# Patient Record
Sex: Female | Born: 1955 | Race: Black or African American | Hispanic: No | Smoking: Never smoker
Health system: Southern US, Community
[De-identification: ages and names within clinical notes are randomized; demographics above are authoritative.]

## PROBLEM LIST (undated history)

## (undated) DIAGNOSIS — I10 Essential (primary) hypertension: Secondary | ICD-10-CM

## (undated) DIAGNOSIS — D573 Sickle-cell trait: Secondary | ICD-10-CM

## (undated) DIAGNOSIS — N289 Disorder of kidney and ureter, unspecified: Secondary | ICD-10-CM

## (undated) DIAGNOSIS — E215 Disorder of parathyroid gland, unspecified: Secondary | ICD-10-CM

## (undated) DIAGNOSIS — I499 Cardiac arrhythmia, unspecified: Secondary | ICD-10-CM

## (undated) DIAGNOSIS — C50919 Malignant neoplasm of unspecified site of unspecified female breast: Secondary | ICD-10-CM

## (undated) DIAGNOSIS — R51 Headache: Secondary | ICD-10-CM

## (undated) DIAGNOSIS — J189 Pneumonia, unspecified organism: Secondary | ICD-10-CM

## (undated) DIAGNOSIS — I82409 Acute embolism and thrombosis of unspecified deep veins of unspecified lower extremity: Secondary | ICD-10-CM

## (undated) DIAGNOSIS — F419 Anxiety disorder, unspecified: Secondary | ICD-10-CM

## (undated) DIAGNOSIS — M109 Gout, unspecified: Secondary | ICD-10-CM

## (undated) DIAGNOSIS — N189 Chronic kidney disease, unspecified: Secondary | ICD-10-CM

## (undated) DIAGNOSIS — F32A Depression, unspecified: Secondary | ICD-10-CM

## (undated) DIAGNOSIS — R519 Headache, unspecified: Secondary | ICD-10-CM

## (undated) DIAGNOSIS — Z992 Dependence on renal dialysis: Secondary | ICD-10-CM

## (undated) DIAGNOSIS — L409 Psoriasis, unspecified: Secondary | ICD-10-CM

## (undated) DIAGNOSIS — I509 Heart failure, unspecified: Secondary | ICD-10-CM

## (undated) DIAGNOSIS — F329 Major depressive disorder, single episode, unspecified: Secondary | ICD-10-CM

## (undated) DIAGNOSIS — D649 Anemia, unspecified: Secondary | ICD-10-CM

## (undated) DIAGNOSIS — R112 Nausea with vomiting, unspecified: Secondary | ICD-10-CM

## (undated) HISTORY — DX: Heart failure, unspecified: I50.9

## (undated) HISTORY — PX: PARTIAL HYSTERECTOMY: SHX80

## (undated) HISTORY — DX: Acute embolism and thrombosis of unspecified deep veins of unspecified lower extremity: I82.409

## (undated) HISTORY — DX: Psoriasis, unspecified: L40.9

## (undated) HISTORY — DX: Major depressive disorder, single episode, unspecified: F32.9

## (undated) HISTORY — DX: Essential (primary) hypertension: I10

## (undated) HISTORY — PX: APPENDECTOMY: SHX54

## (undated) HISTORY — DX: Anemia, unspecified: D64.9

## (undated) HISTORY — DX: Disorder of parathyroid gland, unspecified: E21.5

## (undated) HISTORY — DX: Gout, unspecified: M10.9

## (undated) HISTORY — DX: Dependence on renal dialysis: Z99.2

## (undated) HISTORY — DX: Anxiety disorder, unspecified: F41.9

## (undated) HISTORY — DX: Depression, unspecified: F32.A

## (undated) SURGERY — INSERTION, TUNNELED CENTRAL VENOUS DEVICE, WITH PORT
Anesthesia: Choice | Laterality: Left

---

## 1978-01-07 HISTORY — PX: ABDOMINAL HYSTERECTOMY: SHX81

## 1983-01-08 DIAGNOSIS — I82409 Acute embolism and thrombosis of unspecified deep veins of unspecified lower extremity: Secondary | ICD-10-CM

## 1983-01-08 HISTORY — DX: Acute embolism and thrombosis of unspecified deep veins of unspecified lower extremity: I82.409

## 2000-01-08 HISTORY — PX: BREAST EXCISIONAL BIOPSY: SUR124

## 2003-03-22 ENCOUNTER — Other Ambulatory Visit: Payer: Self-pay

## 2004-06-22 ENCOUNTER — Ambulatory Visit: Payer: Self-pay

## 2005-08-27 ENCOUNTER — Ambulatory Visit: Payer: Self-pay

## 2006-11-04 ENCOUNTER — Ambulatory Visit: Payer: Self-pay

## 2006-11-23 ENCOUNTER — Emergency Department: Payer: Self-pay | Admitting: Emergency Medicine

## 2007-12-23 ENCOUNTER — Ambulatory Visit: Payer: Self-pay

## 2009-01-10 ENCOUNTER — Ambulatory Visit: Payer: Self-pay | Admitting: Family Medicine

## 2010-02-13 ENCOUNTER — Ambulatory Visit: Payer: Self-pay

## 2010-06-15 ENCOUNTER — Emergency Department: Payer: Self-pay | Admitting: Emergency Medicine

## 2011-05-21 ENCOUNTER — Ambulatory Visit: Payer: Self-pay

## 2011-05-23 ENCOUNTER — Ambulatory Visit: Payer: Self-pay

## 2012-01-08 HISTORY — PX: INSERTION OF DIALYSIS CATHETER: SHX1324

## 2012-04-30 DIAGNOSIS — N051 Unspecified nephritic syndrome with focal and segmental glomerular lesions: Secondary | ICD-10-CM | POA: Insufficient documentation

## 2012-04-30 DIAGNOSIS — I82401 Acute embolism and thrombosis of unspecified deep veins of right lower extremity: Secondary | ICD-10-CM | POA: Insufficient documentation

## 2012-04-30 DIAGNOSIS — F32A Depression, unspecified: Secondary | ICD-10-CM | POA: Insufficient documentation

## 2012-04-30 DIAGNOSIS — F329 Major depressive disorder, single episode, unspecified: Secondary | ICD-10-CM | POA: Insufficient documentation

## 2012-08-04 ENCOUNTER — Ambulatory Visit: Payer: Self-pay | Admitting: Family Medicine

## 2012-08-13 DIAGNOSIS — G252 Other specified forms of tremor: Secondary | ICD-10-CM | POA: Insufficient documentation

## 2013-01-27 DIAGNOSIS — R634 Abnormal weight loss: Secondary | ICD-10-CM | POA: Insufficient documentation

## 2013-01-27 DIAGNOSIS — R432 Parageusia: Secondary | ICD-10-CM | POA: Insufficient documentation

## 2013-01-28 ENCOUNTER — Ambulatory Visit: Payer: Self-pay

## 2013-04-27 ENCOUNTER — Inpatient Hospital Stay: Payer: Self-pay | Admitting: Internal Medicine

## 2013-04-27 LAB — CBC WITH DIFFERENTIAL/PLATELET
Basophil #: 0 10*3/uL (ref 0.0–0.1)
Basophil %: 0.5 %
Eosinophil #: 0.2 10*3/uL (ref 0.0–0.7)
Eosinophil %: 2 %
HCT: 33.7 % — AB (ref 35.0–47.0)
HGB: 10.8 g/dL — ABNORMAL LOW (ref 12.0–16.0)
Lymphocyte #: 1.5 10*3/uL (ref 1.0–3.6)
Lymphocyte %: 18.2 %
MCH: 25.7 pg — AB (ref 26.0–34.0)
MCHC: 32.1 g/dL (ref 32.0–36.0)
MCV: 80 fL (ref 80–100)
Monocyte #: 0.6 x10 3/mm (ref 0.2–0.9)
Monocyte %: 6.6 %
NEUTROS ABS: 6.2 10*3/uL (ref 1.4–6.5)
NEUTROS PCT: 72.7 %
Platelet: 340 10*3/uL (ref 150–440)
RBC: 4.21 10*6/uL (ref 3.80–5.20)
RDW: 17.3 % — ABNORMAL HIGH (ref 11.5–14.5)
WBC: 8.5 10*3/uL (ref 3.6–11.0)

## 2013-04-27 LAB — COMPREHENSIVE METABOLIC PANEL
ALBUMIN: 1.9 g/dL — AB (ref 3.4–5.0)
ALK PHOS: 109 U/L
ANION GAP: 12 (ref 7–16)
AST: 15 U/L (ref 15–37)
BUN: 62 mg/dL — ABNORMAL HIGH (ref 7–18)
Bilirubin,Total: 0.2 mg/dL (ref 0.2–1.0)
Calcium, Total: 8.3 mg/dL — ABNORMAL LOW (ref 8.5–10.1)
Chloride: 112 mmol/L — ABNORMAL HIGH (ref 98–107)
Co2: 18 mmol/L — ABNORMAL LOW (ref 21–32)
Creatinine: 11.45 mg/dL — ABNORMAL HIGH (ref 0.60–1.30)
GFR CALC AF AMER: 4 — AB
GFR CALC NON AF AMER: 3 — AB
GLUCOSE: 99 mg/dL (ref 65–99)
OSMOLALITY: 301 (ref 275–301)
Potassium: 3.5 mmol/L (ref 3.5–5.1)
SGPT (ALT): 18 U/L (ref 12–78)
SODIUM: 142 mmol/L (ref 136–145)
Total Protein: 6.1 g/dL — ABNORMAL LOW (ref 6.4–8.2)

## 2013-04-27 LAB — URINALYSIS, COMPLETE
Bilirubin,UR: NEGATIVE
Glucose,UR: 150 mg/dL (ref 0–75)
Hyaline Cast: 3
Ketone: NEGATIVE
Leukocyte Esterase: NEGATIVE
Nitrite: NEGATIVE
PH: 5 (ref 4.5–8.0)
Protein: >=500
RBC,UR: 18 /HPF (ref 0–5)
SPECIFIC GRAVITY: 1.033 (ref 1.003–1.030)
Squamous Epithelial: 4

## 2013-04-27 LAB — PROTIME-INR
INR: 3.8
Prothrombin Time: 35.9 secs — ABNORMAL HIGH (ref 11.5–14.7)

## 2013-04-27 LAB — CLOSTRIDIUM DIFFICILE(ARMC)

## 2013-04-28 LAB — BASIC METABOLIC PANEL
Anion Gap: 13 (ref 7–16)
BUN: 64 mg/dL — AB (ref 7–18)
CALCIUM: 7.7 mg/dL — AB (ref 8.5–10.1)
CHLORIDE: 118 mmol/L — AB (ref 98–107)
Co2: 14 mmol/L — ABNORMAL LOW (ref 21–32)
Creatinine: 11.12 mg/dL — ABNORMAL HIGH (ref 0.60–1.30)
EGFR (African American): 4 — ABNORMAL LOW
EGFR (Non-African Amer.): 3 — ABNORMAL LOW
Glucose: 82 mg/dL (ref 65–99)
Osmolality: 306 (ref 275–301)
Potassium: 3.4 mmol/L — ABNORMAL LOW (ref 3.5–5.1)
Sodium: 145 mmol/L (ref 136–145)

## 2013-04-28 LAB — WBCS, STOOL

## 2013-04-28 LAB — CBC WITH DIFFERENTIAL/PLATELET
BASOS ABS: 0 10*3/uL (ref 0.0–0.1)
Basophil %: 0.8 %
Eosinophil #: 0.1 10*3/uL (ref 0.0–0.7)
Eosinophil %: 2 %
HCT: 26.9 % — ABNORMAL LOW (ref 35.0–47.0)
HGB: 8.7 g/dL — AB (ref 12.0–16.0)
Lymphocyte #: 1.3 10*3/uL (ref 1.0–3.6)
Lymphocyte %: 20.4 %
MCH: 25.9 pg — ABNORMAL LOW (ref 26.0–34.0)
MCHC: 32.2 g/dL (ref 32.0–36.0)
MCV: 80 fL (ref 80–100)
Monocyte #: 0.6 x10 3/mm (ref 0.2–0.9)
Monocyte %: 9.9 %
Neutrophil #: 4.3 10*3/uL (ref 1.4–6.5)
Neutrophil %: 66.9 %
Platelet: 228 10*3/uL (ref 150–440)
RBC: 3.35 10*6/uL — ABNORMAL LOW (ref 3.80–5.20)
RDW: 16.9 % — AB (ref 11.5–14.5)
WBC: 6.4 10*3/uL (ref 3.6–11.0)

## 2013-04-28 LAB — PROTIME-INR
INR: 4.5 — AB
PROTHROMBIN TIME: 41.2 s — AB (ref 11.5–14.7)

## 2013-04-29 LAB — URINE CULTURE

## 2013-04-29 LAB — PROTIME-INR
INR: 3.4
PROTHROMBIN TIME: 33 s — AB (ref 11.5–14.7)

## 2013-04-30 LAB — LIPID PANEL
Cholesterol: 335 mg/dL — ABNORMAL HIGH (ref 0–200)
HDL Cholesterol: 32 mg/dL — ABNORMAL LOW (ref 40–60)
Ldl Cholesterol, Calc: 258 mg/dL — ABNORMAL HIGH (ref 0–100)
TRIGLYCERIDES: 225 mg/dL — AB (ref 0–200)
VLDL Cholesterol, Calc: 45 mg/dL — ABNORMAL HIGH (ref 5–40)

## 2013-04-30 LAB — IRON AND TIBC
IRON: 55 ug/dL (ref 50–170)
Iron Bind.Cap.(Total): 135 ug/dL — ABNORMAL LOW (ref 250–450)
Iron Saturation: 41 %
UNBOUND IRON-BIND. CAP.: 80 ug/dL

## 2013-04-30 LAB — STOOL CULTURE

## 2013-04-30 LAB — PROTIME-INR
INR: 1.8
Prothrombin Time: 20.7 secs — ABNORMAL HIGH (ref 11.5–14.7)

## 2013-04-30 LAB — FERRITIN: Ferritin (ARMC): 289 ng/mL

## 2013-05-01 LAB — RENAL FUNCTION PANEL
ALBUMIN: 1.5 g/dL — AB (ref 3.4–5.0)
ANION GAP: 13 (ref 7–16)
BUN: 44 mg/dL — AB (ref 7–18)
CALCIUM: 7.4 mg/dL — AB (ref 8.5–10.1)
CREATININE: 8.15 mg/dL — AB (ref 0.60–1.30)
Chloride: 108 mmol/L — ABNORMAL HIGH (ref 98–107)
Co2: 22 mmol/L (ref 21–32)
EGFR (African American): 6 — ABNORMAL LOW
EGFR (Non-African Amer.): 5 — ABNORMAL LOW
GLUCOSE: 111 mg/dL — AB (ref 65–99)
OSMOLALITY: 297 (ref 275–301)
POTASSIUM: 2.9 mmol/L — AB (ref 3.5–5.1)
Phosphorus: 6.2 mg/dL — ABNORMAL HIGH (ref 2.5–4.9)
SODIUM: 143 mmol/L (ref 136–145)

## 2013-05-01 LAB — CBC WITH DIFFERENTIAL/PLATELET
Eosinophil: 3 %
HCT: 26.1 % — ABNORMAL LOW (ref 35.0–47.0)
HGB: 8.3 g/dL — ABNORMAL LOW (ref 12.0–16.0)
Lymphocytes: 14 %
MCH: 25.2 pg — ABNORMAL LOW (ref 26.0–34.0)
MCHC: 31.6 g/dL — AB (ref 32.0–36.0)
MCV: 80 fL (ref 80–100)
Monocytes: 5 %
Platelet: 211 10*3/uL (ref 150–440)
RBC: 3.28 10*6/uL — ABNORMAL LOW (ref 3.80–5.20)
RDW: 15.9 % — AB (ref 11.5–14.5)
Segmented Neutrophils: 78 %
WBC: 7.2 10*3/uL (ref 3.6–11.0)

## 2013-05-01 LAB — PROTIME-INR
INR: 1.3
Prothrombin Time: 16.1 secs — ABNORMAL HIGH (ref 11.5–14.7)

## 2013-05-02 LAB — RENAL FUNCTION PANEL
Albumin: 1.5 g/dL — ABNORMAL LOW (ref 3.4–5.0)
Anion Gap: 7 (ref 7–16)
BUN: 24 mg/dL — AB (ref 7–18)
CO2: 30 mmol/L (ref 21–32)
CREATININE: 5.41 mg/dL — AB (ref 0.60–1.30)
Calcium, Total: 7.8 mg/dL — ABNORMAL LOW (ref 8.5–10.1)
Chloride: 104 mmol/L (ref 98–107)
EGFR (African American): 9 — ABNORMAL LOW
GFR CALC NON AF AMER: 8 — AB
Glucose: 102 mg/dL — ABNORMAL HIGH (ref 65–99)
Osmolality: 285 (ref 275–301)
PHOSPHORUS: 4.7 mg/dL (ref 2.5–4.9)
Potassium: 3 mmol/L — ABNORMAL LOW (ref 3.5–5.1)
SODIUM: 141 mmol/L (ref 136–145)

## 2013-05-02 LAB — CBC WITH DIFFERENTIAL/PLATELET
Basophil #: 0 10*3/uL (ref 0.0–0.1)
Basophil %: 0.5 %
Eosinophil #: 0.2 10*3/uL (ref 0.0–0.7)
Eosinophil %: 2.3 %
HCT: 25.4 % — ABNORMAL LOW (ref 35.0–47.0)
HGB: 8.4 g/dL — AB (ref 12.0–16.0)
Lymphocyte #: 1.1 10*3/uL (ref 1.0–3.6)
Lymphocyte %: 13.8 %
MCH: 26.3 pg (ref 26.0–34.0)
MCHC: 33.1 g/dL (ref 32.0–36.0)
MCV: 80 fL (ref 80–100)
Monocyte #: 0.9 x10 3/mm (ref 0.2–0.9)
Monocyte %: 11.4 %
NEUTROS ABS: 5.8 10*3/uL (ref 1.4–6.5)
Neutrophil %: 72 %
PLATELETS: 192 10*3/uL (ref 150–440)
RBC: 3.19 10*6/uL — ABNORMAL LOW (ref 3.80–5.20)
RDW: 16.1 % — ABNORMAL HIGH (ref 11.5–14.5)
WBC: 8.1 10*3/uL (ref 3.6–11.0)

## 2013-05-02 LAB — PROTIME-INR
INR: 1.3
PROTHROMBIN TIME: 16 s — AB (ref 11.5–14.7)

## 2013-05-02 LAB — HEMOGLOBIN: HGB: 7.5 g/dL — AB (ref 12.0–16.0)

## 2013-05-03 LAB — PROTIME-INR
INR: 1.2
PROTHROMBIN TIME: 14.8 s — AB (ref 11.5–14.7)

## 2013-05-04 LAB — BASIC METABOLIC PANEL
Anion Gap: 9 (ref 7–16)
BUN: 19 mg/dL — AB (ref 7–18)
Calcium, Total: 8.4 mg/dL — ABNORMAL LOW (ref 8.5–10.1)
Chloride: 104 mmol/L (ref 98–107)
Co2: 29 mmol/L (ref 21–32)
Creatinine: 5.29 mg/dL — ABNORMAL HIGH (ref 0.60–1.30)
EGFR (Non-African Amer.): 8 — ABNORMAL LOW
GFR CALC AF AMER: 10 — AB
Glucose: 83 mg/dL (ref 65–99)
Osmolality: 285 (ref 275–301)
Potassium: 3.3 mmol/L — ABNORMAL LOW (ref 3.5–5.1)
Sodium: 142 mmol/L (ref 136–145)

## 2013-05-04 LAB — PROTIME-INR
INR: 1.2
Prothrombin Time: 14.9 secs — ABNORMAL HIGH (ref 11.5–14.7)

## 2013-05-04 LAB — PHOSPHORUS: Phosphorus: 5.1 mg/dL — ABNORMAL HIGH (ref 2.5–4.9)

## 2013-05-04 LAB — HEMOGLOBIN: HGB: 7.5 g/dL — ABNORMAL LOW (ref 12.0–16.0)

## 2013-05-05 LAB — BASIC METABOLIC PANEL
Anion Gap: 4 — ABNORMAL LOW (ref 7–16)
BUN: 7 mg/dL (ref 7–18)
CHLORIDE: 102 mmol/L (ref 98–107)
Calcium, Total: 7.9 mg/dL — ABNORMAL LOW (ref 8.5–10.1)
Co2: 34 mmol/L — ABNORMAL HIGH (ref 21–32)
Creatinine: 3.22 mg/dL — ABNORMAL HIGH (ref 0.60–1.30)
EGFR (African American): >60
EGFR (Non-African Amer.): >60
GLUCOSE: 94 mg/dL (ref 65–99)
Osmolality: 277 (ref 275–301)
Potassium: 3 mmol/L — ABNORMAL LOW (ref 3.5–5.1)
SODIUM: 140 mmol/L (ref 136–145)

## 2013-05-05 LAB — PROTIME-INR
INR: 1.1
Prothrombin Time: 14.4 secs (ref 11.5–14.7)

## 2013-05-06 LAB — BASIC METABOLIC PANEL
Anion Gap: 5 — ABNORMAL LOW (ref 7–16)
BUN: 13 mg/dL (ref 7–18)
CHLORIDE: 103 mmol/L (ref 98–107)
Calcium, Total: 8.5 mg/dL (ref 8.5–10.1)
Co2: 33 mmol/L — ABNORMAL HIGH (ref 21–32)
Creatinine: 4.78 mg/dL — ABNORMAL HIGH (ref 0.60–1.30)
EGFR (African American): 11 — ABNORMAL LOW
GFR CALC NON AF AMER: 9 — AB
Glucose: 88 mg/dL (ref 65–99)
Osmolality: 281 (ref 275–301)
Potassium: 3.1 mmol/L — ABNORMAL LOW (ref 3.5–5.1)
SODIUM: 141 mmol/L (ref 136–145)

## 2013-05-06 LAB — PROTIME-INR
INR: 1.2
Prothrombin Time: 15.1 secs — ABNORMAL HIGH (ref 11.5–14.7)

## 2013-05-06 LAB — PHOSPHORUS: PHOSPHORUS: 4.8 mg/dL (ref 2.5–4.9)

## 2013-05-07 LAB — PATHOLOGY REPORT

## 2013-05-08 ENCOUNTER — Inpatient Hospital Stay: Payer: Self-pay | Admitting: Internal Medicine

## 2013-05-08 LAB — CBC
HCT: 27.6 % — ABNORMAL LOW (ref 35.0–47.0)
HGB: 8.7 g/dL — AB (ref 12.0–16.0)
MCH: 25.6 pg — ABNORMAL LOW (ref 26.0–34.0)
MCHC: 31.3 g/dL — ABNORMAL LOW (ref 32.0–36.0)
MCV: 82 fL (ref 80–100)
Platelet: 197 10*3/uL (ref 150–440)
RBC: 3.37 10*6/uL — AB (ref 3.80–5.20)
RDW: 16.2 % — AB (ref 11.5–14.5)
WBC: 8.6 10*3/uL (ref 3.6–11.0)

## 2013-05-08 LAB — BASIC METABOLIC PANEL
Anion Gap: 7 (ref 7–16)
BUN: 12 mg/dL (ref 7–18)
CALCIUM: 9.5 mg/dL (ref 8.5–10.1)
CREATININE: 3.64 mg/dL — AB (ref 0.60–1.30)
Chloride: 102 mmol/L (ref 98–107)
Co2: 31 mmol/L (ref 21–32)
GFR CALC AF AMER: 15 — AB
GFR CALC NON AF AMER: 13 — AB
GLUCOSE: 101 mg/dL — AB (ref 65–99)
Osmolality: 279 (ref 275–301)
POTASSIUM: 3.5 mmol/L (ref 3.5–5.1)
SODIUM: 140 mmol/L (ref 136–145)

## 2013-05-08 LAB — PROTIME-INR
INR: 1.3
Prothrombin Time: 16.3 secs — ABNORMAL HIGH (ref 11.5–14.7)

## 2013-05-08 LAB — TROPONIN I

## 2013-05-09 LAB — CBC WITH DIFFERENTIAL/PLATELET
Basophil #: 0.1 10*3/uL (ref 0.0–0.1)
Basophil %: 0.7 %
EOS ABS: 0.3 10*3/uL (ref 0.0–0.7)
Eosinophil %: 2.4 %
HCT: 28.6 % — ABNORMAL LOW (ref 35.0–47.0)
HGB: 8.8 g/dL — AB (ref 12.0–16.0)
Lymphocyte #: 3.3 10*3/uL (ref 1.0–3.6)
Lymphocyte %: 25.4 %
MCH: 25.4 pg — ABNORMAL LOW (ref 26.0–34.0)
MCHC: 30.7 g/dL — AB (ref 32.0–36.0)
MCV: 83 fL (ref 80–100)
Monocyte #: 1 x10 3/mm — ABNORMAL HIGH (ref 0.2–0.9)
Monocyte %: 7.6 %
NEUTROS ABS: 8.3 10*3/uL — AB (ref 1.4–6.5)
Neutrophil %: 63.9 %
Platelet: 236 10*3/uL (ref 150–440)
RBC: 3.46 10*6/uL — ABNORMAL LOW (ref 3.80–5.20)
RDW: 16.2 % — ABNORMAL HIGH (ref 11.5–14.5)
WBC: 13 10*3/uL — AB (ref 3.6–11.0)

## 2013-05-09 LAB — BASIC METABOLIC PANEL
Anion Gap: 10 (ref 7–16)
BUN: 15 mg/dL (ref 7–18)
CHLORIDE: 103 mmol/L (ref 98–107)
CO2: 26 mmol/L (ref 21–32)
CREATININE: 4.42 mg/dL — AB (ref 0.60–1.30)
Calcium, Total: 9.4 mg/dL (ref 8.5–10.1)
EGFR (African American): 12 — ABNORMAL LOW
GFR CALC NON AF AMER: 10 — AB
GLUCOSE: 187 mg/dL — AB (ref 65–99)
Osmolality: 283 (ref 275–301)
POTASSIUM: 3.4 mmol/L — AB (ref 3.5–5.1)
Sodium: 139 mmol/L (ref 136–145)

## 2013-05-09 LAB — TROPONIN I
Troponin-I: 0.04 ng/mL
Troponin-I: 0.16 ng/mL — ABNORMAL HIGH
Troponin-I: 0.18 ng/mL — ABNORMAL HIGH

## 2013-05-09 LAB — CK TOTAL AND CKMB (NOT AT ARMC)
CK, TOTAL: 42 U/L
CK, TOTAL: 41 U/L
CK, TOTAL: 39 U/L
CK-MB: <0.5 ng/mL — ABNORMAL LOW (ref 0.5–3.6)
CK-MB: 0.9 ng/mL (ref 0.5–3.6)
CK-MB: 0.9 ng/mL (ref 0.5–3.6)

## 2013-05-09 LAB — APTT: Activated PTT: 99.1 secs — ABNORMAL HIGH (ref 23.6–35.9)

## 2013-05-10 LAB — PHOSPHORUS: PHOSPHORUS: 5.5 mg/dL — AB (ref 2.5–4.9)

## 2013-05-10 LAB — TROPONIN I: Troponin-I: 0.06 ng/mL — ABNORMAL HIGH

## 2013-05-10 LAB — APTT: ACTIVATED PTT: 76.3 s — AB (ref 23.6–35.9)

## 2013-05-11 LAB — CBC WITH DIFFERENTIAL/PLATELET
BASOS ABS: 0 10*3/uL (ref 0.0–0.1)
Basophil %: 0.5 %
EOS PCT: 2.1 %
Eosinophil #: 0.2 10*3/uL (ref 0.0–0.7)
HCT: 23.4 % — AB (ref 35.0–47.0)
HGB: 7.6 g/dL — ABNORMAL LOW (ref 12.0–16.0)
LYMPHS PCT: 15 %
Lymphocyte #: 1.2 10*3/uL (ref 1.0–3.6)
MCH: 26 pg (ref 26.0–34.0)
MCHC: 32.5 g/dL (ref 32.0–36.0)
MCV: 80 fL (ref 80–100)
Monocyte #: 1 x10 3/mm — ABNORMAL HIGH (ref 0.2–0.9)
Monocyte %: 12 %
Neutrophil #: 5.8 10*3/uL (ref 1.4–6.5)
Neutrophil %: 70.4 %
PLATELETS: 170 10*3/uL (ref 150–440)
RBC: 2.93 10*6/uL — ABNORMAL LOW (ref 3.80–5.20)
RDW: 16.2 % — ABNORMAL HIGH (ref 11.5–14.5)
WBC: 8.2 10*3/uL (ref 3.6–11.0)

## 2013-05-11 LAB — BASIC METABOLIC PANEL
Anion Gap: 6 — ABNORMAL LOW (ref 7–16)
BUN: 8 mg/dL (ref 7–18)
CALCIUM: 8.5 mg/dL (ref 8.5–10.1)
CHLORIDE: 98 mmol/L (ref 98–107)
CO2: 32 mmol/L (ref 21–32)
CREATININE: 3.1 mg/dL — AB (ref 0.60–1.30)
EGFR (African American): 18 — ABNORMAL LOW
GFR CALC NON AF AMER: 16 — AB
Glucose: 93 mg/dL (ref 65–99)
Osmolality: 270 (ref 275–301)
POTASSIUM: 3.3 mmol/L — AB (ref 3.5–5.1)
Sodium: 136 mmol/L (ref 136–145)

## 2013-05-11 LAB — PROTIME-INR
INR: 1.7
Prothrombin Time: 19.2 secs — ABNORMAL HIGH (ref 11.5–14.7)

## 2013-05-11 LAB — APTT: Activated PTT: 89 secs — ABNORMAL HIGH (ref 23.6–35.9)

## 2013-05-12 LAB — CBC WITH DIFFERENTIAL/PLATELET
Basophil #: 0.1 10*3/uL (ref 0.0–0.1)
Basophil %: 0.7 %
EOS PCT: 3.8 %
Eosinophil #: 0.4 10*3/uL (ref 0.0–0.7)
HCT: 22.7 % — AB (ref 35.0–47.0)
HGB: 7.3 g/dL — AB (ref 12.0–16.0)
LYMPHS ABS: 0.9 10*3/uL — AB (ref 1.0–3.6)
Lymphocyte %: 9.3 %
MCH: 26.1 pg (ref 26.0–34.0)
MCHC: 32.3 g/dL (ref 32.0–36.0)
MCV: 81 fL (ref 80–100)
Monocyte #: 0.8 x10 3/mm (ref 0.2–0.9)
Monocyte %: 8.3 %
NEUTROS PCT: 77.9 %
Neutrophil #: 7.4 10*3/uL — ABNORMAL HIGH (ref 1.4–6.5)
Platelet: 160 10*3/uL (ref 150–440)
RBC: 2.81 10*6/uL — AB (ref 3.80–5.20)
RDW: 16.2 % — ABNORMAL HIGH (ref 11.5–14.5)
WBC: 9.5 10*3/uL (ref 3.6–11.0)

## 2013-05-12 LAB — BASIC METABOLIC PANEL
Anion Gap: 9 (ref 7–16)
BUN: 17 mg/dL (ref 7–18)
CALCIUM: 8.6 mg/dL (ref 8.5–10.1)
Chloride: 96 mmol/L — ABNORMAL LOW (ref 98–107)
Co2: 28 mmol/L (ref 21–32)
Creatinine: 4.6 mg/dL — ABNORMAL HIGH (ref 0.60–1.30)
EGFR (Non-African Amer.): 10 — ABNORMAL LOW
GFR CALC AF AMER: 11 — AB
Glucose: 130 mg/dL — ABNORMAL HIGH (ref 65–99)
OSMOLALITY: 270 (ref 275–301)
POTASSIUM: 3.6 mmol/L (ref 3.5–5.1)
Sodium: 133 mmol/L — ABNORMAL LOW (ref 136–145)

## 2013-05-12 LAB — PROTIME-INR
INR: 1.6
Prothrombin Time: 18.8 secs — ABNORMAL HIGH (ref 11.5–14.7)

## 2013-05-12 LAB — APTT: ACTIVATED PTT: 68.3 s — AB (ref 23.6–35.9)

## 2013-05-13 LAB — CBC WITH DIFFERENTIAL/PLATELET
BASOS PCT: 0.4 %
Basophil #: 0 10*3/uL (ref 0.0–0.1)
EOS PCT: 2.4 %
Eosinophil #: 0.2 10*3/uL (ref 0.0–0.7)
HCT: 27.2 % — ABNORMAL LOW (ref 35.0–47.0)
HGB: 8.8 g/dL — AB (ref 12.0–16.0)
LYMPHS ABS: 1.1 10*3/uL (ref 1.0–3.6)
Lymphocyte %: 11.8 %
MCH: 26 pg (ref 26.0–34.0)
MCHC: 32.2 g/dL (ref 32.0–36.0)
MCV: 81 fL (ref 80–100)
MONO ABS: 1.2 x10 3/mm — AB (ref 0.2–0.9)
Monocyte %: 13 %
Neutrophil #: 6.7 10*3/uL — ABNORMAL HIGH (ref 1.4–6.5)
Neutrophil %: 72.4 %
PLATELETS: 193 10*3/uL (ref 150–440)
RBC: 3.37 10*6/uL — AB (ref 3.80–5.20)
RDW: 16 % — ABNORMAL HIGH (ref 11.5–14.5)
WBC: 9.2 10*3/uL (ref 3.6–11.0)

## 2013-05-13 LAB — APTT
ACTIVATED PTT: 58.9 s — AB (ref 23.6–35.9)
Activated PTT: 70.1 secs — ABNORMAL HIGH (ref 23.6–35.9)

## 2013-05-13 LAB — PROTIME-INR
INR: 1.5
PROTHROMBIN TIME: 18 s — AB (ref 11.5–14.7)

## 2013-05-14 LAB — CBC WITH DIFFERENTIAL/PLATELET
Basophil #: 0.1 10*3/uL (ref 0.0–0.1)
Basophil %: 0.7 %
EOS ABS: 0.5 10*3/uL (ref 0.0–0.7)
Eosinophil %: 5.8 %
HCT: 27.1 % — ABNORMAL LOW (ref 35.0–47.0)
HGB: 8.9 g/dL — ABNORMAL LOW (ref 12.0–16.0)
Lymphocyte #: 0.8 10*3/uL — ABNORMAL LOW (ref 1.0–3.6)
Lymphocyte %: 9.5 %
MCH: 26.6 pg (ref 26.0–34.0)
MCHC: 32.9 g/dL (ref 32.0–36.0)
MCV: 81 fL (ref 80–100)
MONO ABS: 1.2 x10 3/mm — AB (ref 0.2–0.9)
Monocyte %: 14.2 %
Neutrophil #: 6.1 10*3/uL (ref 1.4–6.5)
Neutrophil %: 69.8 %
Platelet: 229 10*3/uL (ref 150–440)
RBC: 3.35 10*6/uL — ABNORMAL LOW (ref 3.80–5.20)
RDW: 16.7 % — AB (ref 11.5–14.5)
WBC: 8.7 10*3/uL (ref 3.6–11.0)

## 2013-05-14 LAB — APTT
ACTIVATED PTT: 57.7 s — AB (ref 23.6–35.9)
Activated PTT: >160 secs (ref 23.6–35.9)
Activated PTT: 70.5 secs — ABNORMAL HIGH (ref 23.6–35.9)

## 2013-05-14 LAB — RENAL FUNCTION PANEL
ALBUMIN: 1.4 g/dL — AB (ref 3.4–5.0)
Anion Gap: 5 — ABNORMAL LOW (ref 7–16)
BUN: 19 mg/dL — ABNORMAL HIGH (ref 7–18)
CALCIUM: 9.1 mg/dL (ref 8.5–10.1)
CREATININE: 5.1 mg/dL — AB (ref 0.60–1.30)
Chloride: 98 mmol/L (ref 98–107)
Co2: 34 mmol/L — ABNORMAL HIGH (ref 21–32)
EGFR (Non-African Amer.): 9 — ABNORMAL LOW
GFR CALC AF AMER: 10 — AB
GLUCOSE: 115 mg/dL — AB (ref 65–99)
Osmolality: 277 (ref 275–301)
PHOSPHORUS: 4.8 mg/dL (ref 2.5–4.9)
Potassium: 3.1 mmol/L — ABNORMAL LOW (ref 3.5–5.1)
Sodium: 137 mmol/L (ref 136–145)

## 2013-05-14 LAB — PROTIME-INR
INR: 1.4
Prothrombin Time: 17 secs — ABNORMAL HIGH (ref 11.5–14.7)

## 2013-05-15 LAB — PROTIME-INR
INR: 1.9
Prothrombin Time: 21.4 secs — ABNORMAL HIGH (ref 11.5–14.7)

## 2013-05-15 LAB — APTT: ACTIVATED PTT: 78.1 s — AB (ref 23.6–35.9)

## 2013-05-16 LAB — PLATELET COUNT: PLATELETS: 228 10*3/uL (ref 150–440)

## 2013-05-16 LAB — PROTIME-INR
INR: 2
Prothrombin Time: 22 secs — ABNORMAL HIGH (ref 11.5–14.7)

## 2013-05-16 LAB — HEMOGLOBIN: HGB: 8.4 g/dL — AB (ref 12.0–16.0)

## 2013-05-17 LAB — CLOSTRIDIUM DIFFICILE(ARMC)

## 2013-05-17 LAB — PROTIME-INR
INR: 2.4
Prothrombin Time: 25.5 secs — ABNORMAL HIGH (ref 11.5–14.7)

## 2013-05-17 LAB — PHOSPHORUS: Phosphorus: 3.1 mg/dL (ref 2.5–4.9)

## 2013-05-18 LAB — PROTIME-INR
INR: 2.2
PROTHROMBIN TIME: 23.5 s — AB (ref 11.5–14.7)

## 2013-05-19 LAB — CBC WITH DIFFERENTIAL/PLATELET
BASOS PCT: 0.4 %
Basophil #: 0 10*3/uL (ref 0.0–0.1)
EOS PCT: 1.5 %
Eosinophil #: 0.1 10*3/uL (ref 0.0–0.7)
HCT: 26.6 % — ABNORMAL LOW (ref 35.0–47.0)
HGB: 8.7 g/dL — AB (ref 12.0–16.0)
Lymphocyte #: 1.3 10*3/uL (ref 1.0–3.6)
Lymphocyte %: 13.7 %
MCH: 26.4 pg (ref 26.0–34.0)
MCHC: 32.7 g/dL (ref 32.0–36.0)
MCV: 81 fL (ref 80–100)
MONO ABS: 1.1 x10 3/mm — AB (ref 0.2–0.9)
MONOS PCT: 11.9 %
NEUTROS ABS: 6.6 10*3/uL — AB (ref 1.4–6.5)
Neutrophil %: 72.5 %
PLATELETS: 249 10*3/uL (ref 150–440)
RBC: 3.3 10*6/uL — AB (ref 3.80–5.20)
RDW: 17.3 % — AB (ref 11.5–14.5)
WBC: 9.2 10*3/uL (ref 3.6–11.0)

## 2013-05-19 LAB — PROTIME-INR
INR: 2.5
Prothrombin Time: 26.2 secs — ABNORMAL HIGH (ref 11.5–14.7)

## 2013-05-19 LAB — PHOSPHORUS: Phosphorus: 4.4 mg/dL (ref 2.5–4.9)

## 2013-06-13 ENCOUNTER — Ambulatory Visit: Payer: Self-pay | Admitting: Emergency Medicine

## 2013-06-13 ENCOUNTER — Emergency Department: Payer: Self-pay | Admitting: Emergency Medicine

## 2013-06-13 LAB — CBC WITH DIFFERENTIAL/PLATELET
BASOS PCT: 0.9 %
Basophil #: 0.1 10*3/uL (ref 0.0–0.1)
Eosinophil #: 0.2 10*3/uL (ref 0.0–0.7)
Eosinophil %: 1.9 %
HCT: 28.3 % — AB (ref 35.0–47.0)
HGB: 9 g/dL — ABNORMAL LOW (ref 12.0–16.0)
LYMPHS ABS: 1 10*3/uL (ref 1.0–3.6)
Lymphocyte %: 8.4 %
MCH: 25.3 pg — ABNORMAL LOW (ref 26.0–34.0)
MCHC: 31.8 g/dL — AB (ref 32.0–36.0)
MCV: 80 fL (ref 80–100)
MONO ABS: 1 x10 3/mm — AB (ref 0.2–0.9)
MONOS PCT: 7.8 %
NEUTROS ABS: 9.9 10*3/uL — AB (ref 1.4–6.5)
NEUTROS PCT: 81 %
PLATELETS: 252 10*3/uL (ref 150–440)
RBC: 3.56 10*6/uL — AB (ref 3.80–5.20)
RDW: 18.3 % — ABNORMAL HIGH (ref 11.5–14.5)
WBC: 12.3 10*3/uL — ABNORMAL HIGH (ref 3.6–11.0)

## 2013-06-13 LAB — COMPREHENSIVE METABOLIC PANEL
AST: 13 U/L — AB (ref 15–37)
Albumin: 2.4 g/dL — ABNORMAL LOW (ref 3.4–5.0)
Alkaline Phosphatase: 104 U/L
Anion Gap: 10 (ref 7–16)
BUN: 39 mg/dL — ABNORMAL HIGH (ref 7–18)
Bilirubin,Total: 0.2 mg/dL (ref 0.2–1.0)
CREATININE: 6.56 mg/dL — AB (ref 0.60–1.30)
Calcium, Total: 9.3 mg/dL (ref 8.5–10.1)
Chloride: 97 mmol/L — ABNORMAL LOW (ref 98–107)
Co2: 28 mmol/L (ref 21–32)
EGFR (Non-African Amer.): 6 — ABNORMAL LOW
GFR CALC AF AMER: 7 — AB
Glucose: 159 mg/dL — ABNORMAL HIGH (ref 65–99)
Osmolality: 283 (ref 275–301)
Potassium: 3.5 mmol/L (ref 3.5–5.1)
SGPT (ALT): 13 U/L (ref 12–78)
SODIUM: 135 mmol/L — AB (ref 136–145)
Total Protein: 6.6 g/dL (ref 6.4–8.2)

## 2013-09-08 ENCOUNTER — Emergency Department: Payer: Self-pay | Admitting: Emergency Medicine

## 2013-09-08 LAB — COMPREHENSIVE METABOLIC PANEL
ALK PHOS: 134 U/L — AB
ALT: 19 U/L
Albumin: 3.3 g/dL — ABNORMAL LOW (ref 3.4–5.0)
Anion Gap: 11 (ref 7–16)
BUN: 31 mg/dL — ABNORMAL HIGH (ref 7–18)
Bilirubin,Total: 0.2 mg/dL (ref 0.2–1.0)
CALCIUM: 10.3 mg/dL — AB (ref 8.5–10.1)
CHLORIDE: 104 mmol/L (ref 98–107)
CO2: 25 mmol/L (ref 21–32)
Creatinine: 7.14 mg/dL — ABNORMAL HIGH (ref 0.60–1.30)
EGFR (Non-African Amer.): 6 — ABNORMAL LOW
GFR CALC AF AMER: 7 — AB
Glucose: 89 mg/dL (ref 65–99)
OSMOLALITY: 285 (ref 275–301)
Potassium: 3.8 mmol/L (ref 3.5–5.1)
SGOT(AST): 18 U/L (ref 15–37)
Sodium: 140 mmol/L (ref 136–145)
Total Protein: 7.6 g/dL (ref 6.4–8.2)

## 2013-09-08 LAB — CBC WITH DIFFERENTIAL/PLATELET
BASOS PCT: 1 %
Basophil #: 0.1 10*3/uL (ref 0.0–0.1)
EOS PCT: 3.2 %
Eosinophil #: 0.3 10*3/uL (ref 0.0–0.7)
HCT: 39.4 % (ref 35.0–47.0)
HGB: 12.2 g/dL (ref 12.0–16.0)
LYMPHS ABS: 2.1 10*3/uL (ref 1.0–3.6)
LYMPHS PCT: 24.6 %
MCH: 26.7 pg (ref 26.0–34.0)
MCHC: 31 g/dL — ABNORMAL LOW (ref 32.0–36.0)
MCV: 86 fL (ref 80–100)
Monocyte #: 0.8 x10 3/mm (ref 0.2–0.9)
Monocyte %: 9.7 %
NEUTROS ABS: 5.3 10*3/uL (ref 1.4–6.5)
NEUTROS PCT: 61.5 %
PLATELETS: 290 10*3/uL (ref 150–440)
RBC: 4.57 10*6/uL (ref 3.80–5.20)
RDW: 17.5 % — AB (ref 11.5–14.5)
WBC: 8.6 10*3/uL (ref 3.6–11.0)

## 2013-09-08 LAB — URINALYSIS, COMPLETE
Bacteria: NONE SEEN
Bilirubin,UR: NEGATIVE
Blood: NEGATIVE
KETONE: NEGATIVE
Leukocyte Esterase: NEGATIVE
Nitrite: NEGATIVE
Ph: 7 (ref 4.5–8.0)
RBC,UR: 1 /HPF (ref 0–5)
SPECIFIC GRAVITY: 1.013 (ref 1.003–1.030)
Squamous Epithelial: NONE SEEN
WBC UR: 3 /HPF (ref 0–5)

## 2013-09-08 LAB — TROPONIN I: Troponin-I: 0.02 ng/mL

## 2013-09-08 LAB — LIPASE, BLOOD: Lipase: 195 U/L (ref 73–393)

## 2013-10-04 ENCOUNTER — Inpatient Hospital Stay: Payer: Self-pay | Admitting: Internal Medicine

## 2013-10-04 ENCOUNTER — Ambulatory Visit: Payer: Self-pay | Admitting: Neurology

## 2013-10-04 LAB — COMPREHENSIVE METABOLIC PANEL
ALBUMIN: 3.3 g/dL — AB (ref 3.4–5.0)
ALK PHOS: 167 U/L — AB
Anion Gap: 13 (ref 7–16)
BILIRUBIN TOTAL: 0.4 mg/dL (ref 0.2–1.0)
BUN: 46 mg/dL — ABNORMAL HIGH (ref 7–18)
Calcium, Total: 9 mg/dL (ref 8.5–10.1)
Chloride: 103 mmol/L (ref 98–107)
Co2: 20 mmol/L — ABNORMAL LOW (ref 21–32)
Creatinine: 8.72 mg/dL — ABNORMAL HIGH (ref 0.60–1.30)
EGFR (African American): 6 — ABNORMAL LOW
EGFR (Non-African Amer.): 5 — ABNORMAL LOW
Glucose: 253 mg/dL — ABNORMAL HIGH (ref 65–99)
OSMOLALITY: 292 (ref 275–301)
Potassium: 5.4 mmol/L — ABNORMAL HIGH (ref 3.5–5.1)
SGOT(AST): 24 U/L (ref 15–37)
SGPT (ALT): 23 U/L
Sodium: 136 mmol/L (ref 136–145)
TOTAL PROTEIN: 7.6 g/dL (ref 6.4–8.2)

## 2013-10-04 LAB — DRUG SCREEN, URINE

## 2013-10-04 LAB — CK TOTAL AND CKMB (NOT AT ARMC): CK, TOTAL: 82 U/L

## 2013-10-04 LAB — TROPONIN I
TROPONIN-I: 0.16 ng/mL — AB
TROPONIN-I: 0.18 ng/mL — AB
Troponin-I: 0.02 ng/mL

## 2013-10-04 LAB — CBC
HCT: 45.1 % (ref 35.0–47.0)
HGB: 13.5 g/dL (ref 12.0–16.0)
MCH: 26.1 pg (ref 26.0–34.0)
MCHC: 30 g/dL — AB (ref 32.0–36.0)
MCV: 87 fL (ref 80–100)
Platelet: 262 10*3/uL (ref 150–440)
RBC: 5.18 10*6/uL (ref 3.80–5.20)
RDW: 17.9 % — AB (ref 11.5–14.5)
WBC: 18.2 10*3/uL — ABNORMAL HIGH (ref 3.6–11.0)

## 2013-10-04 LAB — PHOSPHORUS: Phosphorus: 5.4 mg/dL — ABNORMAL HIGH (ref 2.5–4.9)

## 2013-10-04 LAB — PROTIME-INR
INR: 1.5
PROTHROMBIN TIME: 18 s — AB (ref 11.5–14.7)

## 2013-10-04 LAB — PRO B NATRIURETIC PEPTIDE: B-Type Natriuretic Peptide: 33387 pg/mL — ABNORMAL HIGH (ref 0–125)

## 2013-10-04 LAB — APTT: Activated PTT: <23 secs — ABNORMAL LOW (ref 23.6–35.9)

## 2013-10-05 LAB — CBC WITH DIFFERENTIAL/PLATELET
Basophil #: 0.1 10*3/uL (ref 0.0–0.1)
Basophil %: 0.7 %
EOS ABS: 0.1 10*3/uL (ref 0.0–0.7)
EOS PCT: 0.8 %
HCT: 36.7 % (ref 35.0–47.0)
HGB: 12.2 g/dL (ref 12.0–16.0)
LYMPHS PCT: 11.6 %
Lymphocyte #: 1.1 10*3/uL (ref 1.0–3.6)
MCH: 27.3 pg (ref 26.0–34.0)
MCHC: 33.2 g/dL (ref 32.0–36.0)
MCV: 82 fL (ref 80–100)
Monocyte #: 0.8 x10 3/mm (ref 0.2–0.9)
Monocyte %: 8.5 %
Neutrophil #: 7.7 10*3/uL — ABNORMAL HIGH (ref 1.4–6.5)
Neutrophil %: 78.4 %
PLATELETS: 160 10*3/uL (ref 150–440)
RBC: 4.47 10*6/uL (ref 3.80–5.20)
RDW: 17.6 % — AB (ref 11.5–14.5)
WBC: 9.8 10*3/uL (ref 3.6–11.0)

## 2013-10-05 LAB — BASIC METABOLIC PANEL
Anion Gap: 11 (ref 7–16)
BUN: 28 mg/dL — AB (ref 7–18)
CREATININE: 6.13 mg/dL — AB (ref 0.60–1.30)
Calcium, Total: 9.8 mg/dL (ref 8.5–10.1)
Chloride: 106 mmol/L (ref 98–107)
Co2: 26 mmol/L (ref 21–32)
EGFR (African American): 9 — ABNORMAL LOW
EGFR (Non-African Amer.): 7 — ABNORMAL LOW
Glucose: 93 mg/dL (ref 65–99)
Osmolality: 290 (ref 275–301)
Potassium: 4.1 mmol/L (ref 3.5–5.1)
Sodium: 143 mmol/L (ref 136–145)

## 2013-10-05 LAB — PROTIME-INR
INR: 2.4
Prothrombin Time: 25.9 secs — ABNORMAL HIGH (ref 11.5–14.7)

## 2013-10-06 LAB — CBC WITH DIFFERENTIAL/PLATELET
Basophil #: 0.1 10*3/uL (ref 0.0–0.1)
Basophil %: 0.8 %
Eosinophil #: 0.5 10*3/uL (ref 0.0–0.7)
Eosinophil %: 5.5 %
HCT: 37.9 % (ref 35.0–47.0)
HGB: 12.3 g/dL (ref 12.0–16.0)
LYMPHS PCT: 15.2 %
Lymphocyte #: 1.3 10*3/uL (ref 1.0–3.6)
MCH: 27.5 pg (ref 26.0–34.0)
MCHC: 32.5 g/dL (ref 32.0–36.0)
MCV: 85 fL (ref 80–100)
MONO ABS: 0.7 x10 3/mm (ref 0.2–0.9)
MONOS PCT: 8 %
Neutrophil #: 6 10*3/uL (ref 1.4–6.5)
Neutrophil %: 70.5 %
Platelet: 174 10*3/uL (ref 150–440)
RBC: 4.48 10*6/uL (ref 3.80–5.20)
RDW: 17.9 % — ABNORMAL HIGH (ref 11.5–14.5)
WBC: 8.6 10*3/uL (ref 3.6–11.0)

## 2013-10-06 LAB — PHOSPHORUS: Phosphorus: 6.4 mg/dL — ABNORMAL HIGH (ref 2.5–4.9)

## 2013-10-06 LAB — PROTIME-INR
INR: 2.9
Prothrombin Time: 29.4 secs — ABNORMAL HIGH (ref 11.5–14.7)

## 2013-10-07 LAB — CLOSTRIDIUM DIFFICILE(ARMC)

## 2013-10-09 LAB — CULTURE, BLOOD (SINGLE)

## 2013-10-14 ENCOUNTER — Ambulatory Visit: Payer: Self-pay | Admitting: Family Medicine

## 2013-10-26 ENCOUNTER — Ambulatory Visit: Payer: Self-pay | Admitting: Family Medicine

## 2013-10-28 ENCOUNTER — Ambulatory Visit: Payer: Self-pay | Admitting: Family Medicine

## 2013-10-28 HISTORY — PX: BREAST BIOPSY: SHX20

## 2013-11-02 DIAGNOSIS — N186 End stage renal disease: Secondary | ICD-10-CM | POA: Insufficient documentation

## 2013-11-02 DIAGNOSIS — Z992 Dependence on renal dialysis: Secondary | ICD-10-CM

## 2013-11-24 ENCOUNTER — Emergency Department: Payer: Self-pay | Admitting: Emergency Medicine

## 2013-11-24 LAB — CBC
HCT: 39.8 % (ref 35.0–47.0)
HGB: 13 g/dL (ref 12.0–16.0)
MCH: 27.9 pg (ref 26.0–34.0)
MCHC: 32.5 g/dL (ref 32.0–36.0)
MCV: 86 fL (ref 80–100)
PLATELETS: 348 10*3/uL (ref 150–440)
RBC: 4.64 10*6/uL (ref 3.80–5.20)
RDW: 19.2 % — AB (ref 11.5–14.5)
WBC: 13.2 10*3/uL — AB (ref 3.6–11.0)

## 2013-11-24 LAB — PROTIME-INR
INR: 2
Prothrombin Time: 21.8 secs — ABNORMAL HIGH (ref 11.5–14.7)

## 2013-11-24 LAB — BASIC METABOLIC PANEL
Anion Gap: 11 (ref 7–16)
BUN: 23 mg/dL — ABNORMAL HIGH (ref 7–18)
Calcium, Total: 8.6 mg/dL (ref 8.5–10.1)
Chloride: 100 mmol/L (ref 98–107)
Co2: 22 mmol/L (ref 21–32)
Creatinine: 4.36 mg/dL — ABNORMAL HIGH (ref 0.60–1.30)
EGFR (African American): 13 — ABNORMAL LOW
EGFR (Non-African Amer.): 11 — ABNORMAL LOW
GLUCOSE: 169 mg/dL — AB (ref 65–99)
OSMOLALITY: 274 (ref 275–301)
Potassium: 3 mmol/L — ABNORMAL LOW (ref 3.5–5.1)
SODIUM: 133 mmol/L — AB (ref 136–145)

## 2013-12-26 ENCOUNTER — Inpatient Hospital Stay: Payer: Self-pay | Admitting: Internal Medicine

## 2013-12-26 LAB — COMPREHENSIVE METABOLIC PANEL
ALT: 26 U/L
Albumin: 3.3 g/dL — ABNORMAL LOW (ref 3.4–5.0)
Alkaline Phosphatase: 227 U/L — ABNORMAL HIGH
Anion Gap: 15 (ref 7–16)
BUN: 56 mg/dL — AB (ref 7–18)
Bilirubin,Total: 0.4 mg/dL (ref 0.2–1.0)
Calcium, Total: 7.8 mg/dL — ABNORMAL LOW (ref 8.5–10.1)
Chloride: 104 mmol/L (ref 98–107)
Co2: 21 mmol/L (ref 21–32)
Creatinine: 8.49 mg/dL — ABNORMAL HIGH (ref 0.60–1.30)
EGFR (Non-African Amer.): 5 — ABNORMAL LOW
GFR CALC AF AMER: 6 — AB
Glucose: 269 mg/dL — ABNORMAL HIGH (ref 65–99)
Osmolality: 304 (ref 275–301)
Potassium: 4.6 mmol/L (ref 3.5–5.1)
SGOT(AST): 37 U/L (ref 15–37)
Sodium: 140 mmol/L (ref 136–145)
Total Protein: 7.8 g/dL (ref 6.4–8.2)

## 2013-12-26 LAB — CBC
HCT: 38.7 % (ref 35.0–47.0)
HGB: 12.3 g/dL (ref 12.0–16.0)
MCH: 28.4 pg (ref 26.0–34.0)
MCHC: 31.9 g/dL — ABNORMAL LOW (ref 32.0–36.0)
MCV: 89 fL (ref 80–100)
Platelet: 327 10*3/uL (ref 150–440)
RBC: 4.35 10*6/uL (ref 3.80–5.20)
RDW: 18.1 % — ABNORMAL HIGH (ref 11.5–14.5)
WBC: 17 10*3/uL — ABNORMAL HIGH (ref 3.6–11.0)

## 2013-12-26 LAB — PROTIME-INR
INR: 2.3
PROTHROMBIN TIME: 24.6 s — AB (ref 11.5–14.7)

## 2013-12-26 LAB — PRO B NATRIURETIC PEPTIDE: B-Type Natriuretic Peptide: 27687 pg/mL — ABNORMAL HIGH (ref 0–125)

## 2013-12-26 LAB — CK TOTAL AND CKMB (NOT AT ARMC)
CK, Total: 59 U/L (ref 26–192)
CK-MB: <0.5 ng/mL — ABNORMAL LOW (ref 0.5–3.6)

## 2013-12-26 LAB — TROPONIN I: Troponin-I: 0.02 ng/mL

## 2013-12-27 LAB — CBC WITH DIFFERENTIAL/PLATELET
Basophil #: 0 10*3/uL (ref 0.0–0.1)
Basophil %: 0.3 %
Eosinophil #: 0.1 10*3/uL (ref 0.0–0.7)
Eosinophil %: 0.4 %
HCT: 33.9 % — ABNORMAL LOW (ref 35.0–47.0)
HGB: 10.7 g/dL — ABNORMAL LOW (ref 12.0–16.0)
LYMPHS ABS: 0.8 10*3/uL — AB (ref 1.0–3.6)
Lymphocyte %: 7.1 %
MCH: 27.9 pg (ref 26.0–34.0)
MCHC: 31.5 g/dL — AB (ref 32.0–36.0)
MCV: 88 fL (ref 80–100)
Monocyte #: 0.9 x10 3/mm (ref 0.2–0.9)
Monocyte %: 7.6 %
NEUTROS ABS: 10.1 10*3/uL — AB (ref 1.4–6.5)
Neutrophil %: 84.6 %
Platelet: 222 10*3/uL (ref 150–440)
RBC: 3.84 10*6/uL (ref 3.80–5.20)
RDW: 17.9 % — ABNORMAL HIGH (ref 11.5–14.5)
WBC: 11.9 10*3/uL — AB (ref 3.6–11.0)

## 2013-12-27 LAB — BASIC METABOLIC PANEL
ANION GAP: 10 (ref 7–16)
BUN: 62 mg/dL — AB (ref 7–18)
CALCIUM: 7.5 mg/dL — AB (ref 8.5–10.1)
Chloride: 107 mmol/L (ref 98–107)
Co2: 27 mmol/L (ref 21–32)
Creatinine: 8.88 mg/dL — ABNORMAL HIGH (ref 0.60–1.30)
EGFR (Non-African Amer.): 5 — ABNORMAL LOW
GFR CALC AF AMER: 6 — AB
Glucose: 118 mg/dL — ABNORMAL HIGH (ref 65–99)
OSMOLALITY: 306 (ref 275–301)
Potassium: 4.4 mmol/L (ref 3.5–5.1)
Sodium: 144 mmol/L (ref 136–145)

## 2013-12-27 LAB — PHOSPHORUS: Phosphorus: 7.6 mg/dL — ABNORMAL HIGH (ref 2.5–4.9)

## 2013-12-27 LAB — TROPONIN I
TROPONIN-I: 0.14 ng/mL — AB
Troponin-I: 0.08 ng/mL — ABNORMAL HIGH

## 2013-12-28 LAB — BASIC METABOLIC PANEL
Anion Gap: 10 (ref 7–16)
BUN: 29 mg/dL — AB (ref 7–18)
CHLORIDE: 97 mmol/L — AB (ref 98–107)
CO2: 31 mmol/L (ref 21–32)
CREATININE: 5.48 mg/dL — AB (ref 0.60–1.30)
Calcium, Total: 8.6 mg/dL (ref 8.5–10.1)
EGFR (African American): 10 — ABNORMAL LOW
GFR CALC NON AF AMER: 8 — AB
Glucose: 88 mg/dL (ref 65–99)
Osmolality: 281 (ref 275–301)
Potassium: 4 mmol/L (ref 3.5–5.1)
Sodium: 138 mmol/L (ref 136–145)

## 2013-12-28 LAB — CBC WITH DIFFERENTIAL/PLATELET
BASOS PCT: 0.6 %
Basophil #: 0 10*3/uL (ref 0.0–0.1)
EOS ABS: 0.2 10*3/uL (ref 0.0–0.7)
EOS PCT: 3.3 %
HCT: 31.1 % — ABNORMAL LOW (ref 35.0–47.0)
HGB: 9.9 g/dL — AB (ref 12.0–16.0)
LYMPHS PCT: 20.9 %
Lymphocyte #: 1.5 10*3/uL (ref 1.0–3.6)
MCH: 28.3 pg (ref 26.0–34.0)
MCHC: 31.8 g/dL — ABNORMAL LOW (ref 32.0–36.0)
MCV: 89 fL (ref 80–100)
MONO ABS: 0.6 x10 3/mm (ref 0.2–0.9)
Monocyte %: 8.5 %
Neutrophil #: 4.8 10*3/uL (ref 1.4–6.5)
Neutrophil %: 66.7 %
PLATELETS: 186 10*3/uL (ref 150–440)
RBC: 3.5 10*6/uL — ABNORMAL LOW (ref 3.80–5.20)
RDW: 18.1 % — ABNORMAL HIGH (ref 11.5–14.5)
WBC: 7.2 10*3/uL (ref 3.6–11.0)

## 2013-12-28 LAB — PROTIME-INR
INR: 3
Prothrombin Time: 30.2 secs — ABNORMAL HIGH (ref 11.5–14.7)

## 2013-12-28 LAB — TROPONIN I: Troponin-I: 0.07 ng/mL — ABNORMAL HIGH

## 2013-12-29 LAB — CBC WITH DIFFERENTIAL/PLATELET
BASOS ABS: 0 10*3/uL (ref 0.0–0.1)
BASOS PCT: 0.4 %
EOS ABS: 0.3 10*3/uL (ref 0.0–0.7)
EOS PCT: 4.1 %
HCT: 30.4 % — ABNORMAL LOW (ref 35.0–47.0)
HGB: 10 g/dL — ABNORMAL LOW (ref 12.0–16.0)
LYMPHS ABS: 1.8 10*3/uL (ref 1.0–3.6)
LYMPHS PCT: 25.5 %
MCH: 28.6 pg (ref 26.0–34.0)
MCHC: 33 g/dL (ref 32.0–36.0)
MCV: 87 fL (ref 80–100)
MONO ABS: 0.7 x10 3/mm (ref 0.2–0.9)
MONOS PCT: 9.7 %
NEUTROS ABS: 4.3 10*3/uL (ref 1.4–6.5)
Neutrophil %: 60.3 %
PLATELETS: 192 10*3/uL (ref 150–440)
RBC: 3.5 10*6/uL — ABNORMAL LOW (ref 3.80–5.20)
RDW: 17.9 % — ABNORMAL HIGH (ref 11.5–14.5)
WBC: 7.1 10*3/uL (ref 3.6–11.0)

## 2013-12-29 LAB — BASIC METABOLIC PANEL
ANION GAP: 9 (ref 7–16)
BUN: 53 mg/dL — AB (ref 7–18)
CALCIUM: 8.1 mg/dL — AB (ref 8.5–10.1)
Chloride: 98 mmol/L (ref 98–107)
Co2: 29 mmol/L (ref 21–32)
Creatinine: 8.15 mg/dL — ABNORMAL HIGH (ref 0.60–1.30)
EGFR (African American): 6 — ABNORMAL LOW
EGFR (Non-African Amer.): 5 — ABNORMAL LOW
Glucose: 92 mg/dL (ref 65–99)
OSMOLALITY: 286 (ref 275–301)
POTASSIUM: 4.1 mmol/L (ref 3.5–5.1)
Sodium: 136 mmol/L (ref 136–145)

## 2013-12-29 LAB — PHOSPHORUS: PHOSPHORUS: 5.9 mg/dL — AB (ref 2.5–4.9)

## 2013-12-29 LAB — PROTIME-INR
INR: 2.4
Prothrombin Time: 25.8 secs — ABNORMAL HIGH (ref 11.5–14.7)

## 2013-12-29 LAB — HEMOGLOBIN A1C: Hemoglobin A1C: 5 % (ref 4.2–6.3)

## 2014-01-04 ENCOUNTER — Ambulatory Visit: Payer: Self-pay | Admitting: Family

## 2014-01-15 ENCOUNTER — Inpatient Hospital Stay: Payer: Self-pay | Admitting: Internal Medicine

## 2014-01-15 LAB — COMPREHENSIVE METABOLIC PANEL
ALT: 25 U/L
ANION GAP: 14 (ref 7–16)
Albumin: 3.6 g/dL (ref 3.4–5.0)
Alkaline Phosphatase: 275 U/L — ABNORMAL HIGH
BILIRUBIN TOTAL: 0.4 mg/dL (ref 0.2–1.0)
BUN: 28 mg/dL — ABNORMAL HIGH (ref 7–18)
CO2: 22 mmol/L (ref 21–32)
Calcium, Total: 8 mg/dL — ABNORMAL LOW (ref 8.5–10.1)
Chloride: 100 mmol/L (ref 98–107)
Creatinine: 6.75 mg/dL — ABNORMAL HIGH (ref 0.60–1.30)
GFR CALC AF AMER: 8 — AB
GFR CALC NON AF AMER: 7 — AB
Glucose: 174 mg/dL — ABNORMAL HIGH (ref 65–99)
OSMOLALITY: 282 (ref 275–301)
POTASSIUM: 4.2 mmol/L (ref 3.5–5.1)
SGOT(AST): 30 U/L (ref 15–37)
Sodium: 136 mmol/L (ref 136–145)
Total Protein: 8.3 g/dL — ABNORMAL HIGH (ref 6.4–8.2)

## 2014-01-15 LAB — CBC
HCT: 47.3 % — AB (ref 35.0–47.0)
HGB: 15.1 g/dL (ref 12.0–16.0)
MCH: 28.3 pg (ref 26.0–34.0)
MCHC: 31.9 g/dL — AB (ref 32.0–36.0)
MCV: 89 fL (ref 80–100)
Platelet: 321 10*3/uL (ref 150–440)
RBC: 5.33 10*6/uL — ABNORMAL HIGH (ref 3.80–5.20)
RDW: 18.4 % — ABNORMAL HIGH (ref 11.5–14.5)
WBC: 12.9 10*3/uL — ABNORMAL HIGH (ref 3.6–11.0)

## 2014-01-15 LAB — CBC WITH DIFFERENTIAL/PLATELET
BASOS ABS: 0 10*3/uL (ref 0.0–0.1)
BASOS PCT: 0.3 %
EOS PCT: 0.6 %
Eosinophil #: 0.1 10*3/uL (ref 0.0–0.7)
HCT: 39.8 % (ref 35.0–47.0)
HGB: 12.9 g/dL (ref 12.0–16.0)
Lymphocyte #: 1.1 10*3/uL (ref 1.0–3.6)
Lymphocyte %: 10.2 %
MCH: 28.8 pg (ref 26.0–34.0)
MCHC: 32.5 g/dL (ref 32.0–36.0)
MCV: 88 fL (ref 80–100)
MONOS PCT: 6 %
Monocyte #: 0.6 x10 3/mm (ref 0.2–0.9)
NEUTROS ABS: 8.9 10*3/uL — AB (ref 1.4–6.5)
Neutrophil %: 82.9 %
Platelet: 280 10*3/uL (ref 150–440)
RBC: 4.5 10*6/uL (ref 3.80–5.20)
RDW: 17.4 % — ABNORMAL HIGH (ref 11.5–14.5)
WBC: 10.8 10*3/uL (ref 3.6–11.0)

## 2014-01-15 LAB — PROTIME-INR
INR: 2.2
Prothrombin Time: 23.7 secs — ABNORMAL HIGH (ref 11.5–14.7)

## 2014-01-15 LAB — RENAL FUNCTION PANEL
Albumin: 3.2 g/dL — ABNORMAL LOW (ref 3.4–5.0)
Anion Gap: 10 (ref 7–16)
BUN: 32 mg/dL — AB (ref 7–18)
CALCIUM: 7.4 mg/dL — AB (ref 8.5–10.1)
CO2: 26 mmol/L (ref 21–32)
CREATININE: 7.13 mg/dL — AB (ref 0.60–1.30)
Chloride: 103 mmol/L (ref 98–107)
EGFR (African American): 8 — ABNORMAL LOW
EGFR (Non-African Amer.): 6 — ABNORMAL LOW
GLUCOSE: 132 mg/dL — AB (ref 65–99)
OSMOLALITY: 286 (ref 275–301)
PHOSPHORUS: 6.5 mg/dL — AB (ref 2.5–4.9)
POTASSIUM: 4.1 mmol/L (ref 3.5–5.1)
Sodium: 139 mmol/L (ref 136–145)

## 2014-01-15 LAB — CK TOTAL AND CKMB (NOT AT ARMC): CK, TOTAL: 59 U/L (ref 26–192)

## 2014-01-15 LAB — PRO B NATRIURETIC PEPTIDE: B-TYPE NATIURETIC PEPTID: 13233 pg/mL — AB (ref 0–125)

## 2014-01-15 LAB — TROPONIN I: TROPONIN-I: 0.02 ng/mL

## 2014-01-16 LAB — CBC WITH DIFFERENTIAL/PLATELET
BASOS PCT: 0.4 %
Basophil #: 0 10*3/uL (ref 0.0–0.1)
EOS PCT: 0.3 %
Eosinophil #: 0 10*3/uL (ref 0.0–0.7)
HCT: 38.9 % (ref 35.0–47.0)
HGB: 12.3 g/dL (ref 12.0–16.0)
Lymphocyte #: 1.6 10*3/uL (ref 1.0–3.6)
Lymphocyte %: 13.6 %
MCH: 28.2 pg (ref 26.0–34.0)
MCHC: 31.7 g/dL — ABNORMAL LOW (ref 32.0–36.0)
MCV: 89 fL (ref 80–100)
MONOS PCT: 7.5 %
Monocyte #: 0.9 x10 3/mm (ref 0.2–0.9)
NEUTROS PCT: 78.2 %
Neutrophil #: 9.3 10*3/uL — ABNORMAL HIGH (ref 1.4–6.5)
PLATELETS: 218 10*3/uL (ref 150–440)
RBC: 4.37 10*6/uL (ref 3.80–5.20)
RDW: 18.4 % — ABNORMAL HIGH (ref 11.5–14.5)
WBC: 11.9 10*3/uL — AB (ref 3.6–11.0)

## 2014-01-16 LAB — RENAL FUNCTION PANEL
ALBUMIN: 3.1 g/dL — AB (ref 3.4–5.0)
Anion Gap: 10 (ref 7–16)
BUN: 27 mg/dL — ABNORMAL HIGH (ref 7–18)
CHLORIDE: 99 mmol/L (ref 98–107)
Calcium, Total: 8.7 mg/dL (ref 8.5–10.1)
Co2: 30 mmol/L (ref 21–32)
Creatinine: 5.44 mg/dL — ABNORMAL HIGH (ref 0.60–1.30)
EGFR (African American): 10 — ABNORMAL LOW
GFR CALC NON AF AMER: 9 — AB
Glucose: 86 mg/dL (ref 65–99)
Osmolality: 282 (ref 275–301)
PHOSPHORUS: 4.5 mg/dL (ref 2.5–4.9)
Potassium: 3.9 mmol/L (ref 3.5–5.1)
Sodium: 139 mmol/L (ref 136–145)

## 2014-01-16 LAB — TROPONIN I
TROPONIN-I: 0.05 ng/mL
TROPONIN-I: 0.08 ng/mL — AB

## 2014-01-16 LAB — CK TOTAL AND CKMB (NOT AT ARMC)
CK, TOTAL: 44 U/L (ref 26–192)
CK-MB: 0.7 ng/mL (ref 0.5–3.6)

## 2014-01-16 LAB — CK: CK, Total: 50 U/L (ref 26–192)

## 2014-01-17 DIAGNOSIS — I361 Nonrheumatic tricuspid (valve) insufficiency: Secondary | ICD-10-CM

## 2014-01-17 LAB — CBC WITH DIFFERENTIAL/PLATELET
BASOS ABS: 0 10*3/uL (ref 0.0–0.1)
BASOS PCT: 0.5 %
Eosinophil #: 0.1 10*3/uL (ref 0.0–0.7)
Eosinophil %: 1.8 %
HCT: 41.6 % (ref 35.0–47.0)
HGB: 13.3 g/dL (ref 12.0–16.0)
Lymphocyte #: 1.6 10*3/uL (ref 1.0–3.6)
Lymphocyte %: 20 %
MCH: 28.5 pg (ref 26.0–34.0)
MCHC: 31.9 g/dL — ABNORMAL LOW (ref 32.0–36.0)
MCV: 90 fL (ref 80–100)
MONO ABS: 0.6 x10 3/mm (ref 0.2–0.9)
Monocyte %: 7.8 %
NEUTROS ABS: 5.7 10*3/uL (ref 1.4–6.5)
NEUTROS PCT: 69.9 %
Platelet: 219 10*3/uL (ref 150–440)
RBC: 4.65 10*6/uL (ref 3.80–5.20)
RDW: 18.3 % — ABNORMAL HIGH (ref 11.5–14.5)
WBC: 8.1 10*3/uL (ref 3.6–11.0)

## 2014-01-17 LAB — RENAL FUNCTION PANEL
Albumin: 3.4 g/dL (ref 3.4–5.0)
Anion Gap: 13 (ref 7–16)
BUN: 25 mg/dL — AB (ref 7–18)
CALCIUM: 9.7 mg/dL (ref 8.5–10.1)
Chloride: 95 mmol/L — ABNORMAL LOW (ref 98–107)
Co2: 29 mmol/L (ref 21–32)
Creatinine: 5.12 mg/dL — ABNORMAL HIGH (ref 0.60–1.30)
EGFR (Non-African Amer.): 9 — ABNORMAL LOW
GFR CALC AF AMER: 11 — AB
Glucose: 121 mg/dL — ABNORMAL HIGH (ref 65–99)
Osmolality: 279 (ref 275–301)
Phosphorus: 4.9 mg/dL (ref 2.5–4.9)
Potassium: 3.4 mmol/L — ABNORMAL LOW (ref 3.5–5.1)
Sodium: 137 mmol/L (ref 136–145)

## 2014-01-17 LAB — PROTIME-INR
INR: 2
Prothrombin Time: 22.1 secs — ABNORMAL HIGH (ref 11.5–14.7)

## 2014-01-18 LAB — BASIC METABOLIC PANEL
Anion Gap: 9 (ref 7–16)
BUN: 34 mg/dL — AB (ref 7–18)
CHLORIDE: 95 mmol/L — AB (ref 98–107)
CREATININE: 5.11 mg/dL — AB (ref 0.60–1.30)
Calcium, Total: 9.3 mg/dL (ref 8.5–10.1)
Co2: 30 mmol/L (ref 21–32)
EGFR (African American): 11 — ABNORMAL LOW
GFR CALC NON AF AMER: 9 — AB
Glucose: 90 mg/dL (ref 65–99)
OSMOLALITY: 275 (ref 275–301)
Potassium: 3.8 mmol/L (ref 3.5–5.1)
SODIUM: 134 mmol/L — AB (ref 136–145)

## 2014-02-01 ENCOUNTER — Ambulatory Visit: Payer: Self-pay | Admitting: Family

## 2014-02-02 LAB — COMPREHENSIVE METABOLIC PANEL
ANION GAP: 10 (ref 7–16)
Albumin: 3.5 g/dL (ref 3.4–5.0)
Alkaline Phosphatase: 185 U/L — ABNORMAL HIGH (ref 46–116)
BILIRUBIN TOTAL: 0.3 mg/dL (ref 0.2–1.0)
BUN: 19 mg/dL — ABNORMAL HIGH (ref 7–18)
CALCIUM: 9 mg/dL (ref 8.5–10.1)
CO2: 29 mmol/L (ref 21–32)
Chloride: 101 mmol/L (ref 98–107)
Creatinine: 4.46 mg/dL — ABNORMAL HIGH (ref 0.60–1.30)
GFR CALC AF AMER: 13 — AB
GFR CALC NON AF AMER: 11 — AB
Glucose: 109 mg/dL — ABNORMAL HIGH (ref 65–99)
Osmolality: 282 (ref 275–301)
Potassium: 3 mmol/L — ABNORMAL LOW (ref 3.5–5.1)
SGOT(AST): 22 U/L (ref 15–37)
SGPT (ALT): 20 U/L (ref 14–63)
SODIUM: 140 mmol/L (ref 136–145)
Total Protein: 7.4 g/dL (ref 6.4–8.2)

## 2014-02-02 LAB — CBC
HCT: 37.3 % (ref 35.0–47.0)
HGB: 12.6 g/dL (ref 12.0–16.0)
MCH: 28.7 pg (ref 26.0–34.0)
MCHC: 33.8 g/dL (ref 32.0–36.0)
MCV: 85 fL (ref 80–100)
PLATELETS: 227 10*3/uL (ref 150–440)
RBC: 4.38 10*6/uL (ref 3.80–5.20)
RDW: 16.7 % — ABNORMAL HIGH (ref 11.5–14.5)
WBC: 8.6 10*3/uL (ref 3.6–11.0)

## 2014-02-02 LAB — PRO B NATRIURETIC PEPTIDE: B-Type Natriuretic Peptide: 3502 pg/mL — ABNORMAL HIGH (ref 0–125)

## 2014-02-02 LAB — PROTIME-INR
INR: 2.9
Prothrombin Time: 29.5 secs — ABNORMAL HIGH (ref 11.5–14.7)

## 2014-02-02 LAB — APTT: Activated PTT: 35.8 secs (ref 23.6–35.9)

## 2014-02-02 LAB — CK TOTAL AND CKMB (NOT AT ARMC)
CK, TOTAL: 46 U/L (ref 26–192)
CK-MB: 0.5 ng/mL (ref 0.5–3.6)

## 2014-02-02 LAB — TROPONIN I: TROPONIN-I: 0.05 ng/mL

## 2014-02-03 ENCOUNTER — Observation Stay: Payer: Self-pay | Admitting: Internal Medicine

## 2014-02-03 LAB — BASIC METABOLIC PANEL
ANION GAP: 13 (ref 7–16)
BUN: 21 mg/dL — ABNORMAL HIGH (ref 7–18)
CALCIUM: 8.6 mg/dL (ref 8.5–10.1)
Chloride: 102 mmol/L (ref 98–107)
Co2: 23 mmol/L (ref 21–32)
Creatinine: 5.11 mg/dL — ABNORMAL HIGH (ref 0.60–1.30)
EGFR (African American): 11 — ABNORMAL LOW
EGFR (Non-African Amer.): 9 — ABNORMAL LOW
GLUCOSE: 101 mg/dL — AB (ref 65–99)
Osmolality: 279 (ref 275–301)
Potassium: 4 mmol/L (ref 3.5–5.1)
Sodium: 138 mmol/L (ref 136–145)

## 2014-02-03 LAB — CBC WITH DIFFERENTIAL/PLATELET
Basophil #: 0 10*3/uL (ref 0.0–0.1)
Basophil %: 0.3 %
EOS PCT: 1.3 %
Eosinophil #: 0.1 10*3/uL (ref 0.0–0.7)
HCT: 35.9 % (ref 35.0–47.0)
HGB: 11.7 g/dL — AB (ref 12.0–16.0)
Lymphocyte #: 1.6 10*3/uL (ref 1.0–3.6)
Lymphocyte %: 20.1 %
MCH: 28 pg (ref 26.0–34.0)
MCHC: 32.6 g/dL (ref 32.0–36.0)
MCV: 86 fL (ref 80–100)
MONO ABS: 0.7 x10 3/mm (ref 0.2–0.9)
Monocyte %: 8.4 %
NEUTROS ABS: 5.6 10*3/uL (ref 1.4–6.5)
NEUTROS PCT: 69.9 %
Platelet: 205 10*3/uL (ref 150–440)
RBC: 4.19 10*6/uL (ref 3.80–5.20)
RDW: 16.6 % — ABNORMAL HIGH (ref 11.5–14.5)
WBC: 8 10*3/uL (ref 3.6–11.0)

## 2014-02-03 LAB — LIPID PANEL
CHOLESTEROL: 250 mg/dL — AB (ref 0–200)
HDL: 35 mg/dL — AB (ref 40–60)
Ldl Cholesterol, Calc: 186 mg/dL — ABNORMAL HIGH (ref 0–100)
Triglycerides: 144 mg/dL (ref 0–200)
VLDL CHOLESTEROL, CALC: 29 mg/dL (ref 5–40)

## 2014-02-03 LAB — TROPONIN I
Troponin-I: 0.07 ng/mL — ABNORMAL HIGH
Troponin-I: 0.07 ng/mL — ABNORMAL HIGH

## 2014-02-03 LAB — CK TOTAL AND CKMB (NOT AT ARMC)
CK, Total: 41 U/L (ref 26–192)
CK, Total: 43 U/L (ref 26–192)
CK-MB: 0.7 ng/mL (ref 0.5–3.6)
CK-MB: 0.9 ng/mL (ref 0.5–3.6)

## 2014-02-07 LAB — CULTURE, BLOOD (SINGLE)

## 2014-02-17 ENCOUNTER — Ambulatory Visit: Payer: Medicare Other | Admitting: Cardiovascular Disease

## 2014-03-03 DIAGNOSIS — D573 Sickle-cell trait: Secondary | ICD-10-CM | POA: Insufficient documentation

## 2014-04-30 NOTE — Consult Note (Signed)
Brief Consult Note: Diagnosis: diarrhea.   Patient was seen by consultant.   Consult note dictated.   Comments: This is a 59y/o female with concerns of diarrhea x 3 months. She is admitted with acute on chronic kidney failure, with a creatinine increasing from 7.4 to 11.4 in just one month. Her diarrhea has been persistent for the past three months. She cannot pinpoint any certain trigger or change to her normal routine. She denies any recent antibiotics or new medications. No recent travel. BMs occur after everytime she eats, and therefore can occur between 5-7 times/daily. It is loose and watery, but she denies anything black or bloody. No mucus in the stool. She does get awoken from sleep to use the restroom and has had a few accidents due to the urgency. Lower abdominal cramping is mild and vague, otherwise no abdominal pain. WBC normal. Cdiff neg, remainder of stool studies still pending.  Denies any prior history of colonoscopy. Tolerating diet ok, but still c/o postprandial BMs.   This i a chronic issue that is currently being worked up by [facility]. Work-up in underway and she was supposed to follow up with them today, but unfortunately missed appt. Diarrhea may be secondary to her kidney issues/medications. We are happy to further evaluate this if clinically warrented, howveer she currently has a supratherapuetic INR. If INR is corrected and she is inthe hospital for sufficient time, we cna consider further eval while inpatient. Due to the chronic nature of her symptoms and that work-up is already underway with another provider, this likely can be completed as an outpatient.   Will continue to follow. full consult being dictated..  Electronic Signatures: Sherald Barge (PA-C)  (Signed 22-Apr-15 15:18)  Authored: Brief Consult Note   Last Updated: 22-Apr-15 15:18 by Sherald Barge (PA-C)

## 2014-04-30 NOTE — H&P (Signed)
PATIENT NAME:  Teresa Cook, Teresa Cook MR#:  [id number] DATE OF BIRTH:  15-Jul-1955  DATE OF ADMISSION:  12/26/2013  REFERRING PHYSICIAN: Ahmed Prima, MD   PRIMARY CARE PHYSICIAN: Ngwe A. Clide Deutscher, MD, Philis Pique.  CHIEF COMPLAINT: Shortness of breath.   PAST MEDICAL HISTORY: A 59 year old African American female with past medical history of end-stage renal disease on hemodialysis Monday, Wednesday, Friday, access via right chest tunnelled catheter, as well as history of systolic congestive heart failure, EF of 40%, presenting with shortness of breath. Describes acute onset of shortness of breath while at rest at home; however, denies any cough, fevers, chills, chest pain, or palpitations. She states that she is still able to make urine and has not missed any dialysis days. Compliant with medications. Upon arrival she was noted to be markedly hypoxemic on CPAP and saturating in the low 40s. She was subsequently placed on BiPAP therapy and condition has improved. Now she is saturating in the mid 90% range. Able to speak in full sentences.   REVIEW OF SYSTEMS:  CONSTITUTIONAL: Denies any fevers, chills. Positive for generalized fatigue, weakness.  EYES: Denies blurred vision, double vision, eye pain. EARS, NOSE, THROAT: Denies tinnitus, ear pain, hearing loss.  RESPIRATORY: Positive for cough as stated above. Positive for shortness of breath as stated above. Denies any cough or wheezing.  CARDIOVASCULAR: Denies any chest pain, palpitations, edema, orthopnea, dyspnea on exertion.  GASTROINTESTINAL: Denies vomiting, diarrhea, abdominal pain.  GENITOURINARY: Denies dysuria or hematuria.  ENDOCRINE: Denies nocturia or thyroid problems.  HEMATOLOGIC AND LYMPHATIC: Denies easy bruising, bleeding.  SKIN: Denies rash or lesions.  MUSCULOSKELETAL: Denies pain in neck, back, shoulder, knees, hips, or arthritic symptoms.  NEUROLOGIC: Denies any paralysis or paresthesias.  PSYCHIATRIC: Denies  anxiety or depressive symptoms.   Otherwise, full review of systems performed by me is negative.   PAST MEDICAL HISTORY: End-stage renal disease on hemodialysis Monday, Wednesday, Friday secondary to complications of hypertension, dialysis access from right chest tunnelled catheter, history of essential hypertension, PE, DVT, currently on anticoagulation, gastroesophageal reflux disease with esophagitis, as well as restless leg syndrome.   SOCIAL HISTORY: No alcohol, tobacco, or drug usage.   FAMILY HISTORY: No known cardiovascular or pulmonary disorders.   ALLERGIES: No known drug allergies.   HOME MEDICATIONS: losartan 50 mg p.o. daily, warfarin 5 mg on Monday, Tuesday, Thursday, warfarin 7.5 mg on Sunday, Wednesday, Friday, Saturday, Zoloft 100 mg 2 tabs p.o. at bedtime, metoprolol 100 mg p.o. b.i.d., calcium acetate 667 mg 3 times daily with meals, pantoprazole 40 mg p.o. b.i.d.   PHYSICAL EXAMINATION:  VITAL SIGNS: Heart rate on arrival 107, respiratory rate of 28, blood pressure 181/121 with oxygen saturations 43% on CPAP therapy, currently heart rate 77, respirations 27, blood pressure 146/95, saturating 98% on BiPAP therapy. Weight 70.3 kg, BMI 29.5.  GENERAL: Ill-appearing African American female in respiratory distress requiring BiPAP therapy.  HEAD: Normocephalic, atraumatic.  EYES: Pupils equal, round, reactive to light. Extraocular muscles intact. No scleral icterus.  MOUTH: Moist mucous membranes. Dentition intact. No abscess noted.  EAR, NOSE, THROAT: Clear without exudates. No external lesions.  NECK: Supple.  PULMONARY: Diffuse coarse breath sounds with scattered crackles mainly at the bases, tachypneic, currently on BiPAP therapy. No use of accessory muscles. Good respiratory effort.  CHEST: Nontender to palpation.  CARDIOVASCULAR: S1, S2. Regular rate and rhythm. No murmurs, rubs, or gallops. No edema. Pedal pulses 2+ bilaterally.  GASTROINTESTINAL: Soft, nontender,  nondistended. No masses. Positive bowel sounds.  No hepatosplenomegaly.  MUSCULOSKELETAL: No swelling, clubbing, or edema. Range of motion full in all extremities.  NEUROLOGIC: Cranial nerves II to XII intact. No gross focal neurological deficits. Sensation intact. Reflexes intact.  SKIN: No ulcerations, lesions, rashes, or cyanosis. Skin warm and dry. Turgor intact.  PSYCHIATRIC: Mood and affect within normal limits. She is awake, alert, oriented x 3. Insight and judgment intact.   LABORATORY DATA: Chest x-ray performed reveals bibasilar airspace opacities with vascular congestion reflective of pulmonary edema. Remainder of laboratory data: Sodium of 140, potassium 4.6, chloride 104, bicarbonate 21, BUN 56, creatinine 8.49, glucose 269.  LFTs: Albumin 3.3, alkaline phosphatase of 227, otherwise within normal limits. Troponin 0.02. WBC 17, hemoglobin 12.3, platelets of 327,000. INR of 2.3. ABG performed, 7.24/44/58/18 .9 on BiPAP therapy 14/5 with an FiO2 of 50%.   ASSESSMENT AND PLAN: A 59 year old African American female with history of end-stage renal disease on hemodialysis secondary to complications of hypertension, as well as systolic congestive heart failure, ejection fraction of 40%, presenting with shortness of breath.  1.  Acute respiratory failure with hypoxia secondary to pulmonary edema. Continue bilevel positive airway pressure and wean as tolerated. Provide supplemental oxygen to keep oxygen saturation greater than 92%. DuoNeb treatments every 4 hours. We will diurese with Lasix. Follow urine output, renal function. Hemodialysis as scheduled for first thing in the morning. Case was already discussed with nephrology. Place on telemetry. Trend cardiac enzymes x 3.  2.  End-stage renal disease on hemodialysis secondary to complications of hypertension. The case was discussed with nephrology and she is scheduled for hemodialysis first thing in the morning.  3.  Hyperglycemia without history of  diabetes. Insulin sliding scale with every 6 hour Accu-Cheks. Goal blood glucose between 120 to 180.  4.  Gastroesophageal reflux disease. Proton pump inhibitor therapy.  5.  Deep venous thrombosis/pulmonary embolism. Continue with warfarin, which is currently is therapeutic.  6.  Venous thromboembolism prophylaxis with warfarin, currently therapeutic. 7.  The patient is FULL CODE.   TIME SPENT: 55 minutes.    ____________________________ Aaron Mose. Laster Appling, MD dkh:ts D: 12/27/2013 00:04:00 ET T: 12/27/2013 03:04:02 ET JOB#: [id number]  cc: Aaron Mose. Borghild Thaker, MD, <Dictator> Riyad Keena Woodfin Ganja MD ELECTRONICALLY SIGNED 12/27/2013 20:31

## 2014-04-30 NOTE — H&P (Signed)
PATIENT NAME:  Teresa Cook, Teresa Cook MR#:  [id number] DATE OF BIRTH:  Jul 23, 1955  DATE OF ADMISSION:  04/27/2013  ADMITTING PHYSICIAN: Gladstone Lighter, MD   PRIMARY CARE PHYSICIAN: Ngwe A. Clide Deutscher, MD  PRIMARY NEPHROLOGIST: Dr. Domingo Mend from [facility] nephrology in [place].   CHIEF COMPLAINT: Weakness.   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 The patient says she was diagnosed with CKD about 6 months ago and she has being following with [facility] nephrology and they were monitoring her creatinine. 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 Labs from [facility] from March 2015 shows a BUN of 54 and a creatinine of 7.4 at the time. The patient has been having diarrhea for the last 3 months. She says whatever she takes by mouth immediately runs down. She denies any nausea or vomiting. She states she was advised to follow up with [facility] tomorrow and is scheduled for a procedure and she thinks it is probably upper GI procedure. However, her daughter who lives next door, has observed that the patient has become gradually very weak, was dizzy during standing last week and was also getting easily out of breath so she brought Mom on to the hospital. The patient is noted to be dehydrated and has a creatinine of 11.45 and a BUN of 62 now. The patient states she is still making urine.   PAST MEDICAL HISTORY: 1.  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 Last known creatinine of 7.4 and BUN 54 in March 2015. 2.  Hypertension.  3.  Depression.  4.  History of DVTs.   PAST SURGICAL HISTORY:  1.  Hysterectomy.  2.  Benign breast mass  with biopsies.   ALLERGIES TO MEDICATIONS: No known drug allergies.   CURRENT HOME MEDICATIONS:  1.  Warfarin 5 mg p.o. every day except on Sundays and Wednesdays she takes 5.5 mg.  2.  Metoprolol 100 mg p.o. b.i.d.  3.  Zoloft 100 mg p.o. daily.  4.  Ambien 10 mg at bedtime.   SOCIAL HISTORY: Lives at home with her husband. Daughter lives next door. No history of any smoking or alcohol use.   FAMILY HISTORY: Significant for history of blood clots in the family.   REVIEW OF SYSTEMS:    CONSTITUTIONAL: No fever. Positive for fatigue and weakness.  EYES: No blurred vision, double vision, inflammation or glaucoma.  ENT: No tinnitus, ear pain, hearing loss, epistaxis or discharge.  RESPIRATORY: No cough, wheeze, hemoptysis or chronic obstructive pulmonary disease.  CARDIOVASCULAR: No chest pain, orthopnea, edema, arrhythmia, palpitations or syncope.  GASTROINTESTINAL: Positive for diarrhea. No nausea, vomiting. No abdominal, no hematemesis or melena.  GENITOURINARY: Denies any oliguria or anuria. No dysuria, renal calculus or frequency.  ENDOCRINE: No polyuria, nocturia, thyroid problems, heat or cold intolerance.  HEMATOLOGY: No anemia, easy bruising or bleeding.  SKIN: No acne, rash or lesions.  MUSCULOSKELETAL: Low back pain present. No arthritis or gout.  NEUROLOGIC: No numbness, weakness, CVA, TIA or seizures.  PSYCHOLOGICAL: No anxiety, insomnia or depression.   PHYSICAL EXAMINATION: VITAL SIGNS: Temperature 98.7 degrees Fahrenheit, pulse 80, respirations 18, blood pressure 163/104, pulse  oximetry 96% on room air.  GENERAL: Well-built, well-nourished female lying in bed, not in any acute distress.  HEENT: Normocephalic, atraumatic. Pupils equal, round, reacting to light. Anicteric sclerae. Extraocular movements intact. Oropharynx clear without any erythema, mass or exudates.  NECK: Supple. No thyromegaly, JVD or carotid bruits. No lymphadenopathy.  LUNGS: Moving air bilaterally.  No wheeze or crackles. No use of accessory muscles for breathing.  CARDIOVASCULAR: S1, S2, regular rate and rhythm. No murmurs, rubs or gallops.  ABDOMEN: Soft, nontender, nondistended. No hepatosplenomegaly. Normal bowel sounds.  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 Pedal and dorsalis pedis pulses palpable.  SKIN: Other than the psoriatic changes on both legs, no other skin lesions noted.  LYMPHATICS: No cervical lymphadenopathy.  NEUROLOGIC: Cranial nerves II through XII remain grossly intact. No focal motor or sensory deficits.  PSYCHOLOGICALLY:  The patient is awake, alert, oriented x 3.   LABORATORY DATA:  1.  WBC 8.5, hemoglobin 10.8, hematocrit 33.7, platelet count is 340.  2.  Sodium 142, potassium 3.5, chloride 112, bicarbonate 18, BUN 62, creatinine 11.45, glucose 99 and calcium of 8.3.  3.  ALT 18, AST 15, alkaline phosphatase 109, total bilirubin 0.2 and albumin of 1.9.  4.  INR is 3.8.  5.  Urinalysis with 1+ bacteria, 22 WBCs, no leuk esterase. Protein is greater than 500 mg/dL noted.   ASSESSMENT AND PLAN: This is a 59 year old female with known history of deep vein thrombosis on Coumadin, chronic kidney disease with last creatinine of 7.4 in March 2015 secondary to focal segmental glomerulosclerosis, comes with diarrhea and worsening creatinine.  1.  Acute renal failure on chronic kidney disease. Creatinine worsened from 7.4 to 11.4 in 1 month.  She still makes urine. Likely prerenal and also acute tubular necrosis, cannot rule out rapidly-progressing focal segmental glomerulosclerosis. [facility] nephrology has been consulted. Strict ins and outs.  No acute indication for dialysis at this time but the patient might go that route. Gentle IV fluids at this time. Renal ultrasound and monitor, and avoid nephrotoxins.  2.  Urinary tract infection. Urine cultures have been ordered and started on Rocephin.  3.  Subacute/chronic diarrhea. Gastroenterology  has been consulted. Stool studies. Will hold off on starting antibiotics as no fevers or WBC elevation noted.  4.  Deep vein, on Coumadin. INR is supratherapeutic so decreased the dose of Coumadin and will have pharmacy adjust the dose.  5.  Hypertension. Continue the metoprolol for now.  6.  CODE STATUS: Full code.   TIME SPENT ON ADMISSION: 50 minutes.   ____________________________ Gladstone Lighter, MD rk:cs D: 04/27/2013 14:42:00 ET T: 04/27/2013 15:05:54 ET JOB#: [id number]  cc: Gladstone Lighter, MD, <Dictator> [facility], Dr. Domingo Mend Ngwe A. Clide Deutscher, MD Gladstone Lighter MD ELECTRONICALLY SIGNED 04/27/2013 18:02

## 2014-04-30 NOTE — Consult Note (Signed)
Chief Complaint:  Subjective/Chief Complaint Diarrhea improving. Still without an appetite. Now agreeing to initiate dialysis. No F/C, N/V, edema, hematuria/dysuria.   VITAL SIGNS/ANCILLARY NOTES: **Vital Signs.:   23-Apr-15 14:17  Vital Signs Type Routine  Temperature Temperature (F) 98.5  Celsius 36.9  Temperature Source oral  Pulse Pulse 77  Respirations Respirations 18  Systolic BP Systolic BP 326  Diastolic BP (mmHg) Diastolic BP (mmHg) 88  Mean BP 108  Pulse Ox % Pulse Ox % 100  Pulse Ox Activity Level  At rest  Oxygen Delivery Room Air/ 21 %   Brief Assessment:  GEN no acute distress   Cardiac Regular  no murmur   Respiratory normal resp effort  clear BS   Gastrointestinal details normal Soft  Nontender   EXTR trace edema   Lab Results: General Ref:  22-Apr-15 04:52   Celiac Disease Comprehensive ========== TEST NAME ==========  ========= RESULTS =========  = REFERENCE RANGE =  CELIAC DISEASE COMPREHSN  Celiac Disease Comprehensive Deamidated Gliadin Abs, IgA     [   6 units              ]              0-19     Negative                   0 - 19                                   Weak Positive             20 - 30                                   Moderate to Strong Positive   >30 Deamidated Gliadin Abs, IgG     [   2 units              ]              0-19                                   Negative                   0 - 19                                   Weak Positive             20 - 30                                   Moderate to Strong Positive   >30               [facility]      No: [id number]           [address], [place], [place] [address]           Lindon Romp, MD         [phone]   Result(s) reported on 29 Apr 2013 at 02:52PM.  Routine Chem:  22-Apr-15 04:52   Result  Comment INR - RESULTS VERIFIED BY REPEAT TESTING.  - NOTIFIED OF CRITICAL VALUE  - C/ KELLEY ZIELKE _0  04-28-13. AJO  - READ-BACK PROCESS PERFORMED.   Result(s) reported on 28 Apr 2013 at 05:35AM.  Glucose, Serum 82  BUN  64  Creatinine (comp)  11.12  Sodium, Serum 145  Potassium, Serum  3.4  Chloride, Serum  118  CO2, Serum  14  Calcium (Total), Serum  7.7  Anion Gap 13  Osmolality (calc) 306  eGFR (African American)  4  eGFR (Non-African American)  3 (eGFR values <57m/min/1.73 m2 may be an indication of chronic kidney disease (CKD). Calculated eGFR is useful in patients with stable renal function. The eGFR calculation will not be reliable in acutely ill patients when serum creatinine is changing rapidly. It is not useful in  patients on dialysis. The eGFR calculation may not be applicable to patients at the low and high extremes of body sizes, pregnant women, and vegetarians.)  Routine Coag:  22-Apr-15 04:52   Prothrombin  41.2  INR  4.5 (INR reference interval applies to patients on anticoagulant therapy. A single INR therapeutic range for coumarins is not optimal for all indications; however, the suggested range for most indications is 2.0 - 3.0. Exceptions to the INR Reference Range may include: Prosthetic heart valves, acute myocardial infarction, prevention of myocardial infarction, and combinations of aspirin and anticoagulant. The need for a higher or lower target INR must be assessed individually. Reference: The Pharmacology and Management of the Vitamin K  antagonists: the seventh ACCP Conference on Antithrombotic and Thrombolytic Therapy. CDVVOH.6073Sept:126 (3suppl): [id number] A HCT value >55% may artifactually increase the PT.  In one study,  the increase was an average of 25%. Reference:  "Effect on Routine and Special Coagulation Testing Values of Citrate Anticoagulant Adjustment in Patients with High HCT Values." American Journal of Clinical Pathology 2006;126:400-405.)  23-Apr-15 05:29   Prothrombin  33.0  INR 3.4 (INR reference interval applies to patients on anticoagulant therapy. A single INR  therapeutic range for coumarins is not optimal for all indications; however, the suggested range for most indications is 2.0 - 3.0. Exceptions to the INR Reference Range may include: Prosthetic heart valves, acute myocardial infarction, prevention of myocardial infarction, and combinations of aspirin and anticoagulant. The need for a higher or lower target INR must be assessed individually. Reference: The Pharmacology and Management of the Vitamin K  antagonists: the seventh ACCP Conference on Antithrombotic and Thrombolytic Therapy. CXTGGY.6948Sept:126 (3suppl): [id number] A HCT value >55% may artifactually increase the PT.  In one study,  the increase was an average of 25%. Reference:  "Effect on Routine and Special Coagulation Testing Values of Citrate Anticoagulant Adjustment in Patients with High HCT Values." American Journal of Clinical Pathology 2006;126:400-405.)  Routine Hem:  22-Apr-15 04:52   WBC (CBC) 6.4  RBC (CBC)  3.35  Hemoglobin (CBC)  8.7  Hematocrit (CBC)  26.9  Platelet Count (CBC) 228  MCV 80  MCH  25.9  MCHC 32.2  RDW  16.9  Neutrophil % 66.9  Lymphocyte % 20.4  Monocyte % 9.9  Eosinophil % 2.0  Basophil % 0.8  Neutrophil # 4.3  Lymphocyte # 1.3  Monocyte # 0.6  Eosinophil # 0.1  Basophil # 0.0 (Result(s) reported on 28 Apr 2013 at 05:50AM.)   Assessment/Plan:  Assessment/Plan:  Assessment 59yo woman with collapsing FSGS and symptomatic uremia manifesting as poor appetite, diarrhea.   Plan I have spoken with Dr. SCandiss Norseand he  will initiate dialysis as soon as a CVC can be placed. INR still elevated but coming down steadily. Will start her on erythropoeitin for her anemia. I have spoken with the [facility] which is close to her home and they will take her once she has completed her first couple of treatments.   Electronic Signatures: Mottl, Simeon Craft (MD)  (Signed 23-Apr-15 16:51)  Authored: Chief Complaint, VITAL SIGNS/ANCILLARY NOTES,  Brief Assessment, Lab Results, Assessment/Plan   Last Updated: 23-Apr-15 16:51 by Garnette Czech (MD)

## 2014-04-30 NOTE — Consult Note (Signed)
PATIENT NAME:  Teresa Cook, Teresa Cook MR#:  [id number] DATE OF BIRTH:  Dec 23, 1955  DATE OF CONSULTATION:  04/28/2013  GASTROENTEROLOGY CONSULTATION  REFERRING PHYSICIAN:  Dr. Tressia Miners CONSULTING PHYSICIAN:  Corky Sox. Zettie Pho, PA-C  ATTENDING GASTROENTEROLOGIST: Dr. Gustavo Lah  REASON FOR CONSULTATION: Diarrhea.   HISTORY OF PRESENT ILLNESS: This pleasant 59 year old African American female who initially presented to the hospital with concerns of weakness who later was found to be in acute kidney failure with a creatinine of 7.4, increasing to 11.4 in only 1 month. The patient has happened to mention that she had been struggling with diarrhea for the past 3 months. She explains that she will use the restroom anywhere from 5 to 7 times per day and it is always postprandial. Shortly after she eats anything, it seems as though she has to run to the bathroom and there is associated urgency. She has on occasion had to get up from sleep to have a bowel movement and has had a periodic accident over the past 3 months. There has not been any blood or mucus in the stool. She denies any prior history of a colonoscopy. There is no nausea or vomiting. There is no fever or chills. No recent travel or sick contacts. No recent antibiotics. The patient has had this evaluated by [facility] gastroenterology, but workup is currently underway and she did have to miss an appointment because of her being inpatient right now.  She explains that she was planned for some sort of procedure, but she cannot tell me exactly what that procedure well. Of note, she is on warfarin therapy for history of a DVT and her INR is supratherapeutic at 4.5. White blood cell count is normal. C. difficile is negative and remainder of stool studies are currently pending. She has been tolerating a diet without any upper gastrointestinal difficulties but does continue to have postprandial diarrhea.   PAST MEDICAL HISTORY: Chronic kidney disease, hypertension,  depression, history of a DVT currently being anticoagulated.   ALLERGIES: No known drug allergies.   PAST SURGICAL HISTORY: Hysterectomy, benign breast mass requiring a biopsy, appendectomy.   MEDICATIONS: Warfarin, metoprolol, Zoloft, Ambien.   SOCIAL HISTORY: The patient denies any alcohol, tobacco or illicit drug use.   FAMILY HISTORY: There is no known family history of GI malignancy, colon polyps or IBD.   REVIEW OF SYSTEMS:  Ten-system review of systems was obtained on the patient. Pertinent positives are mentioned above and otherwise negative.   OBJECTIVE: VITAL SIGNS: Blood pressure 147/86, heart rate 75, respirations 18, temperature 97.5, bedside pulse ox is 98%.  GENERAL: This is a pleasant 59 year old female resting quietly and comfortably in bed in no acute distress, alert and oriented x 3.  HEAD: Atraumatic, normocephalic.  NECK: Supple. No lymphadenopathy noted.  HEENT: Sclerae anicteric. Mucous membranes moist.  PULMONARY: Respirations are even and unlabored. Clear to auscultation bilateral anterior lung fields.  CARDIAC: Regular rate and rhythm. S1, S2 noted.  ABDOMEN: Soft, nontender, nondistended. Normoactive bowel sounds noted in all 4 quadrants. No guarding or rebound. No masses, hernias or organomegaly appreciated.  PSYCHIATRIC: Appropriate and affect.  RECTAL: Deferred.  EXTREMITIES: Negative for lower extremity edema, 2+ pulses noted bilateral upper extremities.   LABORATORY DATA: White blood cells 6.4, hemoglobin 8.7 down from 10.8, hematocrit 26.9, platelets 228. Sodium 145, potassium 3.4, BUN 64, creatinine 11.12, glucose 82. INR 4.5, PT 41.2. C. difficile negative. LFTs within normal limits. MCV is 80.   IMAGING:  Ultrasound of bilateral kidneys was obtained  on the patient showing no evidence of hydronephrosis or bladder distention. There was increased echogenicity consistent with chronic kidney disease.   ASSESSMENT: 1.  Acute kidney failure with a  creatinine of 7.4, increasing to 11.4 in only 1 month.  2.  Chronic diarrhea for the past 3 months. Symptoms seem to be worse postprandially. Clostridium difficile is negative and other stool studies are currently pending.  3.  History of deep vein thrombosis, currently on warfarin therapy with a supratherapeutic INR.   PLAN: I have discussed this patient's case in detail with Dr. Loistine Simas who is involved in the development of the patient's plan of care. For starters, the patient is currently undergoing workup with [facility] gastroenterology who is in the middle of their evaluation. Her diarrhea may certainly be secondary to medications and her kidneys, though we are awaiting the remainder of her stool studies. We are happy to further evaluate her with endoscopic intervention. However, this is not an acute issue and certainly it would not be feasible at the current stage due to her supratherapeutic INR. If her INR is corrected and she is still in the hospital with ongoing symptoms, we can certainly consider her for a colonoscopy if clinically feasible; otherwise, we do recommend outpatient followup with her gastroenterologist for completion of care, being that this is a chronic issue. We will continue to monitor this patient throughout hospitalization and make further recommendations per clinical course.   Thank you so much for this consultation and for allowing [place] to participate in the patient's plan of care.  These services provided by Corky Sox. Yuvan Medinger, PA-C, under collaborative agreement with Loistine Simas, M.D.   ____________________________ Corky Sox. Meredeth Ide kme:ce D: 04/28/2013 15:39:08 ET T: 04/28/2013 16:54:43 ET JOB#: [id number]  cc: Corky Sox. Sydney Hasten, PA-C, <Dictator> [place] PA ELECTRONICALLY SIGNED 04/29/2013 10:58

## 2014-04-30 NOTE — H&P (Signed)
PATIENT NAME:  Teresa Cook, Teresa Cook MR#:  [id number] DATE OF BIRTH:  19-Jun-1955  DATE OF ADMISSION:  05/09/2013  REFERRING PHYSICIAN: Vivien Presto, PA  PRIMARY CARE PHYSICIAN: Dr. Clide Deutscher.  PRIMARY NEPHROLOGY: [facility] in [place].   CHIEF COMPLAINT: Dizziness and shortness of breath.   HISTORY OF PRESENT ILLNESS: This is a 59 year old female who was recently discharged on 05/06/2013 status post new onset hemodialysis with diagnosis of end-stage renal disease. The patient reports she got her hemodialysis Friday with no complication but reports she has been feeling dizzy since then. Upon presentation to ED, the patient was noticed to be orthostatic in the ED so she received 500 mL of normal saline fluid bolus, where she remained to be dizzy after that so she had another 500 mL of normal saline fluid bolus where she became short of breath, hypoxic at 78% on room air. The patient had chest x-ray done which did show volume overload and she was started on oxygen. Hospitalist requested to admit the patient for acute respiratory failure. The patient denies any fever, any chills, any cough, any productive sputum. Reports shortness of breath started after the second fluid bolus. Reports she is making urine so she was given 40 of IV Lasix. Hospitalist requested to admit the patient for her respiratory failure for observation.   PAST MEDICAL HISTORY:  1. End-stage renal disease on hemodialysis Monday, Wednesday, Friday.  2. Hypertension.  3. Depression.  4. History of DVTs.   PAST SURGICAL HISTORY:  1. Hysterectomy.  2. Benign breast mass with biopsies.   ALLERGIES: No known drug allergies.   HOME MEDICATIONS:  1. Metoprolol 100 mg oral 2 times a day.  2. Zoloft 100 mg oral daily.  3. Ambien 10 mg at bedtime. 4. Warfarin 2.5 mg Wednesdays and Sundays along with 5-mg tablets every day.  5. Calcium acetate 667 at one tablet oral 3 times a day.  6. Lactobacillus acidophilus 1 tablet  b.i.d.  7. Protonix 40 mg oral 2 times a day.   SOCIAL HISTORY: The patient lives at home with her husband. No history of smoking or alcohol use.   FAMILY HISTORY: Significant for blood clots in the family.   REVIEW OF SYSTEMS:  CONSTITUTIONAL: The patient denies fever, chills. Complains of fatigue, weakness. Denies weight gain, weight loss.  HEENT: Eyes: Denies blurry vision, , double vision, inflammation, glaucoma. ENT: Denies tinnitus, ear pain, hearing loss, epistaxis.  RESPIRATORY: Denies cough, wheezing, hemoptysis. Reports shortness of breath.  CARDIOVASCULAR: Denies chest pain, edema, palpitation, syncope.  GASTROINTESTINAL: Denies nausea, vomiting, diarrhea, abdominal pain, hematemesis.  GENITOURINARY: Denies dysuria, hematuria, renal colic.  ENDOCRINE: Denies polyuria, polydipsia, heat or cold intolerance.  HEMATOLOGY: Denies anemia, easy bruising, bleeding diathesis.  INTEGUMENT: Denies acne, rash or skin lesion.  MUSCULOSKELETAL: Denies any gout, cramps, or arthritis.  NEUROLOGIC: Denies CVA, TIA, headache, tremors.  PSYCHIATRIC: Denies anxiety, insomnia. Reports history of depression.   PHYSICAL EXAMINATION:  VITAL SIGNS: Temperature 98.6, pulse 86, respiratory rate 18, blood pressure 162/100, saturating 98% on 4 liters nasal cannula.  GENERAL: Elderly female in mild respiratory distress.  HEENT: Head atraumatic, normocephalic. Pupils equal, reactive to light. Pink conjunctivae. Anicteric sclerae. Moist oral mucosa.  NECK: Supple. No thyromegaly. No JVD.  CHEST: Good air entry bilaterally with bibasilar crackles. No wheezing, rales, rhonchi.  CARDIOVASCULAR: S1, S2 heard. No rubs, murmurs, or gallops.  ABDOMEN: Soft, nontender, nondistended. Bowel sounds present.  EXTREMITIES: No edema, chronic skin changes in both legs. Pedal pulses and  radial pulses felt bilaterally.  PSYCHIATRIC: Appropriate affect. Awake, alert x 3. Intact judgment and insight. NEUROLOGIC: Cranial  nerves are grossly intact. Motor 5/5. No focal deficits  MUSCULOSKELETAL: No joint effusion or erythema.   PERTINENT LABORATORY DATA: Glucose 101, BUN 12, creatinine 3.64, sodium 140, potassium 3.5, chloride 102, CO2 of 31. Troponin less than 0.02. White blood cell 8.6, hemoglobin 8.7, hematocrit 27.6, platelets 197,000. INR 1.3.   IMAGING: Chest x-ray: Cardiac enlargement with mild pulmonary vascular congestion suggesting fluid overload.   ASSESSMENT AND PLAN:  1. Acute respiratory failure. The patient has hypoxic respiratory failure, saturating 78% on room air. This is due to volume overload, most likely iatrogenic. The patient will be given 40 of IV Lasix currently and will be kept 20 IV Lasix b.i.d. for 2 doses total. At this point, there is no need for emergent hemodialysis so we will consult nephrology service depending on her condition in the morning. We will see if she needs hemodialysis done prior to her next scheduled one on Monday. We will be keeping her on p.r.n. oxygen.  2. Dizziness. This is due to orthostasis. The patient may need Midodrine on hemodialysis day. We will hold her metoprolol.  3. End-stage renal disease. We will consult nephrology to resume her on hemodialysis.  4. Hypertension: Blood pressure is currently slightly, but she is orthostatic upon presentation, so we will hold on metoprolol. 5. History of depression. Continue with Zoloft.  6. History of deep venous thrombosis. We will keep patient on subcutaneous heparin until her INR is therapeutic then will discontinue her subcutaneous heparin. Will consult pharmacy to dose warfarin for target INR 2-3.  7. Deep vein thrombosis prophylaxis. Subcutaneous heparin until INR is therapeutic.   CODE STATUS: The patient is full code.   TOTAL TIME SPENT ON ADMISSION AND PATIENT CARE: 55 minutes.    ____________________________ Albertine Patricia, MD dse:lt D: 05/09/2013 00:55:09 ET T: 05/09/2013 02:35:26  ET JOB#: [id number]  cc: Albertine Patricia, MD, <Dictator> [place] Graciela Husbands MD ELECTRONICALLY SIGNED 05/10/2013 0:08

## 2014-04-30 NOTE — Consult Note (Signed)
Chief Complaint:  Subjective/Chief Complaint seen for chronic diarrhea, malnutritiion, weight loss.  some loose stool today, denies abdominal pain.   VITAL SIGNS/ANCILLARY NOTES: **Vital Signs.:   23-Apr-15 14:17  Vital Signs Type Routine  Temperature Temperature (F) 98.5  Celsius 36.9  Temperature Source oral  Pulse Pulse 77  Respirations Respirations 18  Systolic BP Systolic BP 123456  Diastolic BP (mmHg) Diastolic BP (mmHg) 88  Mean BP 108  Pulse Ox % Pulse Ox % 100  Pulse Ox Activity Level  At rest  Oxygen Delivery Room Air/ 21 %   Brief Assessment:  Cardiac Regular   Respiratory clear BS   Gastrointestinal details normal Soft  Nontender  Nondistended  No masses palpable  Bowel sounds normal   Lab Results: General Ref:  22-Apr-15 04:52   Celiac Disease Comprehensive ========== TEST NAME ==========  ========= RESULTS =========  = REFERENCE RANGE =  CELIAC DISEASE COMPREHSN  Celiac Disease Comprehensive Deamidated Gliadin Abs, IgA     [   6 units              ]              0-19     Negative                   0 - 19                                   Weak Positive             20 - 30                                   Moderate to Strong Positive   >30 Deamidated Gliadin Abs, IgG     [   2 units              ]              0-19                                   Negative                   0 - 19                                   Weak Positive             20 - 30                                   Moderate to Strong Positive   >30               [facility]      [address]: [id number], [place],  [id number]           Lindon Romp, MD         [phone]   Result(s) reported on 29 Apr 2013 at 02:52PM.  Routine Coag:  21-Apr-15 12:20   INR 3.8 (INR reference interval applies to patients on anticoagulant  therapy. A single INR therapeutic range for coumarins is not optimal for all indications; however, the suggested range for most  indications is 2.0 - 3.0. Exceptions to the INR Reference Range may include: Prosthetic heart valves, acute myocardial infarction, prevention of myocardial infarction, and combinations of aspirin and anticoagulant. The need for a higher or lower target INR must be assessed individually. Reference: The Pharmacology and Management of the Vitamin K  antagonists: the seventh ACCP Conference on Antithrombotic and Thrombolytic Therapy. [id number] Sept:126 (3suppl): X2190819. A HCT value >55% may artifactually increase the PT.  In one study,  the increase was an average of 25%. Reference:  "Effect on Routine and Special Coagulation Testing Values of Citrate Anticoagulant Adjustment in Patients with High HCT Values." American Journal of Clinical Pathology 2006;126:400-405.)  22-Apr-15 04:52   INR  4.5 (INR reference interval applies to patients on anticoagulant therapy. A single INR therapeutic range for coumarins is not optimal for all indications; however, the suggested range for most indications is 2.0 - 3.0. Exceptions to the INR Reference Range may include: Prosthetic heart valves, acute myocardial infarction, prevention of myocardial infarction, and combinations of aspirin and anticoagulant. The need for a higher or lower target INR must be assessed individually. Reference: The Pharmacology and Management of the Vitamin K  antagonists: the seventh ACCP Conference on Antithrombotic and Thrombolytic Therapy. [id number] Sept:126 (3suppl): X2190819. A HCT value >55% may artifactually increase the PT.  In one study,  the increase was an average of 25%. Reference:  "Effect on Routine and Special Coagulation Testing Values of Citrate Anticoagulant Adjustment in Patients with High HCT Values." American Journal of Clinical Pathology 2006;126:400-405.)  23-Apr-15 05:29   Prothrombin  33.0  INR 3.4 (INR reference interval applies to patients on anticoagulant therapy. A single INR therapeutic  range for coumarins is not optimal for all indications; however, the suggested range for most indications is 2.0 - 3.0. Exceptions to the INR Reference Range may include: Prosthetic heart valves, acute myocardial infarction, prevention of myocardial infarction, and combinations of aspirin and anticoagulant. The need for a higher or lower target INR must be assessed individually. Reference: The Pharmacology and Management of the Vitamin K  antagonists: the seventh ACCP Conference on Antithrombotic and Thrombolytic Therapy. [id number] Sept:126 (3suppl): X2190819. A HCT value >55% may artifactually increase the PT.  In one study,  the increase was an average of 25%. Reference:  "Effect on Routine and Special Coagulation Testing Values of Citrate Anticoagulant Adjustment in Patients with High HCT Values." American Journal of Clinical Pathology 2006;126:400-405.)   Assessment/Plan:  Assessment/Plan:  Assessment 1) chronic diarrhea, weight loss.  no previous colonoscopoy.  no blood in stoor per patient.  2) ckd-us c/w medical renal disease   Plan 1) recommend egd and colonoscopy for luminal evaluation.  patient with severe wt loss over a year.  further labs pending re pancreatic insufficiency.  If egd and colonoscopy uninformative, would consider ct c/a/p.  will plan for prep sunday night and proceedures monday if INR less than 1.4. discussed with Dr Posey Pronto.   Electronic Signatures: Loistine Simas (MD)  (Signed 23-Apr-15 20:46)  Authored: Chief Complaint, VITAL SIGNS/ANCILLARY NOTES, Brief Assessment, Lab Results, Assessment/Plan   Last Updated: 23-Apr-15 20:46 by Loistine Simas (MD)

## 2014-04-30 NOTE — Discharge Summary (Signed)
Dates of Admission and Diagnosis:  Date of Admission 08-May-2013   Date of Discharge 19-May-2013   Admitting Diagnosis Acute resp failure   Final Diagnosis * Right IJ thrombus * Acute respiratory failure * Acute on chronic systolic chf * HTN *  Esrd on HD  * elevated trop likley from demand ischemia * hx PE/DVT * dysphagia,    Chief Complaint/History of Present Illness CHIEF COMPLAINT: Dizziness and shortness of breath.   HISTORY OF PRESENT ILLNESS: This is a 59 year old female who was recently discharged on 05/06/2013 status post new onset hemodialysis with diagnosis of end-stage renal disease. The patient reports she got her hemodialysis Friday with no complication but reports she has been feeling dizzy since then. Upon presentation to ED, the patient was noticed to be orthostatic in the ED so she received 500 mL of normal saline fluid bolus, where she remained to be dizzy after that so she had another 500 mL of normal saline fluid bolus where she became short of breath, hypoxic at 78% on room air. The patient had chest x-ray done which did show volume overload and she was started on oxygen. Hospitalist requested to admit the patient for acute respiratory failure. The patient denies any fever, any chills, any cough, any productive sputum. Reports shortness of breath started after the second fluid bolus. Reports she is making urine so she was given 40 of IV Lasix. Hospitalist requested to admit the patient for her respiratory failure for observation.   Allergies:  No Known Allergies:   Pertinent Past History:  Pertinent Past History PAST MEDICAL HISTORY:  1. End-stage renal disease on hemodialysis Monday, Wednesday, Friday.  2. Hypertension.  3. Depression.  4. History of DVTs.   PAST SURGICAL HISTORY:  1. Hysterectomy.  2. Benign breast mass with biopsies.   Hospital Course:  Hospital Course * Right IJ thrombus Provoked likely by Tunneled catheter. Pts INR was  subtherapeutic at admission and since has been on heparin drip. Now INR is therapeutic Per Dr. Lucky Cowboy continue tunneled cath as she needs HD access and there is chance a new dialysis cath may cause thrombosis too. Presently her INR is 2.5. Will continue coumadin to target INR 2.5-3.5. F/U with Dr. Dew/Dr. Curley Spice for Vein mapping and AVF as OP. Recheck INR in 2 days.  * Acute respiratory failure from pulmonary edema from ESRD as well as acute systoic CHF exacerbation - extubated - coreg and losartan.  * HTN/ Tachycardia - coreg, losartan  *  Esrd on HD  -HD as per nephrology  * Elevated troponin due to demand ischemia  * hx PE/DVT - coumadin is theraprutic.  * weakness- SNF  * dysphagia, voice also weak - advanced diet - ENT saw and vocal cords are moving, PPI  Today on exam Still has pain right neck but no swelling/fluctuance. S1, S2 Lungs CTA  Patient wanted me to talk to family. One son can only come in tomorrow. I offered to speak over the phone today or meet them as a group tomorrow when her son is in. She mentions they will look into it. Did not want me to call them over phone.  time spent on d/c 50 minutes   Condition on Discharge Fair   Code Status:  Code Status Full Code   DISCHARGE INSTRUCTIONS HOME MEDS:  Medication Reconciliation: Patient's Home Medications at Discharge:     Medication Instructions  zoloft 100 mg oral tablet  1 tab(s) orally once a day   calcium acetate 667  mg oral capsule  1 cap(s) orally 3 times a day (with meals)   metoprolol tartrate 100 mg oral tablet  1 tab(s) orally 2 times a day   lactobacillus acidophilus  1 cap(s) orally 2 times a day   warfarin 1 mg oral tablet  5 tab(s) orally Daily on Mon, Tue, Thur, Fri, Sat. 10 mg on Wed and Sun.   pantoprazole 40 mg oral delayed release tablet  1 tab(s) orally once a day   benzocaine-menthol topical  1 lozenge orally every 2 hours, As needed, cough, sore throat   acetaminophen-oxycodone 325  mg-5 mg oral tablet  1 tab(s) orally every 8 hours, As Needed, pain , As needed, pain   losartan 25 mg oral tablet  1 tab(s) orally    acetaminophen 325 mg oral tablet  2 tab(s) orally every 4 hours, As needed, pain or temp. greater than 100.4   benzocaine 20% topical spray  1 spray(s) topically every 3 hours, As needed, sore throat    PRESCRIPTIONS: PRINTED AND PLACED ON CHART  STOP TAKING THE FOLLOWING MEDICATION(S):    tramadol 50 mg oral tablet: 1 tab(s) orally every 6 hours, As Needed, pain , As needed, pain ambien 10 mg oral tablet: 1 tab(s) orally once a day (at bedtime)  Physician's Instructions:  Diet Renal Diet   Activity Limitations As tolerated   Return to Work Not Applicable   Time frame for Follow Up Appointment 1-2 weeks  Dr. Lucky Cowboy for AV fistula   Other Comments Recheck INR in 2 days. INR target 2.5-3.5.   Electronic Signatures: Kyasia Steuck, Lottie Dawson (MD)  (Signed 13-May-15 14:40)  Authored: ADMISSION DATE AND DIAGNOSIS, CHIEF COMPLAINT/HPI, Allergies, PERTINENT PAST [place] MEDS, PATIENT INSTRUCTIONS   Last Updated: 13-May-15 14:40 by Alba Destine (MD)

## 2014-04-30 NOTE — Consult Note (Signed)
Chief Complaint:  Subjective/Chief Complaint Diarrhea continues. Had dialysis yest. Expect dialysis today.   VITAL SIGNS/ANCILLARY NOTES: **Vital Signs.:   26-Apr-15 09:06  Vital Signs Type Q 4hr  Temperature Temperature (F) 98.8  Celsius 37.1  Temperature Source oral  Pulse Pulse 83  Respirations Respirations 18  Systolic BP Systolic BP [id number]  Diastolic BP (mmHg) Diastolic BP (mmHg) 84  Mean BP 103  Pulse Ox % Pulse Ox % 94  Pulse Ox Activity Level  At rest  Oxygen Delivery Room Air/ 21 %   Brief Assessment:  GEN no acute distress   Cardiac Regular   Respiratory clear BS   Gastrointestinal Normal   Assessment/Plan:  Assessment/Plan:  Assessment Chronic diarrhea.   Plan Bowel prep today for colonoscopy/EGD tomorrow afternoon. Not sure if patient will undergo dailysis tomorrow. If dialysis tomorrow, then Dr. Gustavo Lah may postpone procedures until Tues.   Electronic Signatures: Verdie Shire (MD)  (Signed 26-Apr-15 09:44)  Authored: Chief Complaint, VITAL SIGNS/ANCILLARY NOTES, Brief Assessment, Assessment/Plan   Last Updated: 26-Apr-15 09:44 by Verdie Shire (MD)

## 2014-04-30 NOTE — Consult Note (Signed)
PATIENT NAME:  Teresa Cook, TUAZON MR#:  [id number] DATE OF BIRTH:  04-03-1955  DATE OF CONSULTATION:  10/04/2013  CONSULTING PHYSICIAN:  Leotis Pain, MD  REASON FOR CONSULTATION: Questionable seizure activity.  HISTORY OF PRESENT ILLNESS: A 59 year old female presented with acute respiratory distress, shortness of breath, suspected pneumonia, with desaturation status post extubation. Upon arrival into ICU, there was a question about seizure activity that was short-lived. The patient is currently sedated. She is on Versed and fentanyl for pain and she is on vancomycin and Zosyn for suspected pneumonia.  REVIEW OF SYSTEMS: Unable to obtain.  LABORATORY DATA: The patient's laboratory workup has been reviewed. CAT scan suspected meningioma along the tentorium.   PHYSICAL EXAMINATION:  VITAL SIGNS: Include a temperature of 99.5, pulse 101, respirations 21, blood pressure 157/110.  NEUROLOGIC: The patient is sedated on Versed and fentanyl for pain, does not follow any commands. Pupils 2, sluggish, reactive bilaterally, responds to visual threats, is not able to track me, bilateral withdrawal from painful stimulation. No purposeful movements, suspect due to sedation. No seizure activity currently observed.   IMPRESSION: A 59 year old female with a history of end-stage renal disease on hemodialysis, hypertension, history of pulmonary embolism, deep venous thrombosis, history of internal jugular clot, hyperparathyroidism, gastroesophageal reflux disease, depression, presented with shortness of breath, acute respiratory failure with hypoxemia, status post intubation. Suspected respiratory failure due to hypoxemia due to pneumonia, on broad-spectrum antibiotics including vancomycin and Zosyn, sedated with Versed and fentanyl.   PLAN: Unsure whether this was a true seizure activity, but at the same time, the patient was hypoxemic and has suspicion of infection, which both lower seizure threshold. The  patient has no history of seizures in the past. She was started on Keppra 500 mg b.i.d. I am okay with continuing Keppra. Keppra has to be dosed prior to dialysis. The same dose of 500 mg and after dialysis patient has to receive another 500 mg after each dialysis dose. I would continue Keppra for about 7 days at which point I would discontinue it because the patient does not have chronic history of seizures and if this was a seizure I think it was provoked by infection, hypoxemia. Meningioma on the CAT scan: I suspect this is an incidental finding. I do not think it is contributing to her current condition. We will try to wean off Versed, possibly starting Precedex if the patient tolerates. If patient's mental status is not improved once sedation is being weaned off, I would get an EEG at that point just to make sure the patient is not in nonconvulsive status, even though I think it is highly unlikely. The rest of her treatment for infection per primary team as well as appreciation of renal consultation with history of end-stage renal disease. Thank you, it was a pleasure seeing this patient.    ____________________________ Leotis Pain, MD yz:lt D: 10/04/2013 15:48:01 ET T: 10/04/2013 16:27:06 ET JOB#: [id number]  cc: Leotis Pain, MD, <Dictator> Leotis Pain MD ELECTRONICALLY SIGNED 10/11/2013 14:26

## 2014-04-30 NOTE — Consult Note (Signed)
Chief Complaint:  Subjective/Chief Complaint chronic diarrhea.  please see full GI consult and brief consult note. Paitent with at least 3 months of n/v diarrhea and weight loss.  denies black or bloody stools.  "evertime I eat things go straight through me"  Was seen at [facility] and egd was planned for today? but patient came here.  bm 3-4/day.  positive mucus. no h/o pudz no previous endoscopy or colonoscopy.  previous h/o taking aleve bid on a daily basis for ha.  Patient with marked psoriasis both ankles for about a year.   GI family history negaitive for CRC.  Poor appetite weight loss of 100# ove the year.  Recommend further evaluation of this issue, including egd and colonoscopy.  Patient wishes to have this done while a patient here as possible. Will need to hold anticoagulant (h/o dvt, on anticoagulation for over 8 years).  Will get stool for fecal elastase, fecal fat, o+p, celiac panel. Following.   VITAL SIGNS/ANCILLARY NOTES: **Vital Signs.:   22-Apr-15 14:00  Vital Signs Type Routine  Temperature Temperature (F) 97.9  Celsius 36.6  Pulse Pulse 74  Respirations Respirations 18  Systolic BP Systolic BP 177  Diastolic BP (mmHg) Diastolic BP (mmHg) 89  Mean BP 107  Pulse Ox % Pulse Ox % 100  Pulse Ox Activity Level  At rest  Oxygen Delivery Room Air/ 21 %   Brief Assessment:  Cardiac Regular   Respiratory clear BS   Gastrointestinal details normal Soft  Nontender  Nondistended  No masses palpable  Bowel sounds normal   Lab Results: Routine Micro:  21-Apr-15 12:20   Micro Text Report URINE CULTURE   ORGANISM 1                30,000 CFU/ML GRAM NEGATIVE ROD   COMMENT                   ID TO FOLLOW SENSITIVITIES TO FOLLOW   ANTIBIOTIC                       Culture Comment ID TO FOLLOW SENSITIVITIES TO FOLLOW  Result(s) reported on 28 Apr 2013 at 11:10AM.  Specimen Source [place]  Organism Name GRAM NEGATIVE ROD  Organism Quantity 30,000 CFU/ML  Organism 1 30,000  CFU/ML GRAM NEGATIVE ROD    20:34   Micro Text Report WBCS, STOOL   COMMENT                   NO RBC'S OR WBC'S SEEN   ANTIBIOTIC                       Micro Text Report STOOL COMPREHENSIVE   COMMENT                   NO SALMONELLA OR SHIGELLA ISOLATED   COMMENT                   NO PATHOGENIC E.COLI DETECTED   COMMENT                   NO CAMPYLOBACTER ANTIGEN DETECTED   ANTIBIOTIC                        Micro Text Report CLOSTRIDIUM DIFFICILE   C.DIFFICILE ANTIGEN       C.DIFFICILE GDH ANTIGEN : NEGATIVE   C.DIFFICILE TOXIN A/B  C.DIFFICILE TOXINS A AND B : NEGATIVE   INTERPRETATION            Negative for C. difficile.    ANTIBIOTIC                        Comment .1. NO RBC'S OR WBC'S SEEN  Result(s) reported on 28 Apr 2013 at 02:49PM.  Culture Comment NO SALMONELLA OR SHIGELLA ISOLATED  Culture Comment . NO PATHOGENIC E.COLI DETECTED  Culture Comment    . NO CAMPYLOBACTER ANTIGEN DETECTED  Result(s) reported on 28 Apr 2013 at 02:16PM.  Specimen Source STOOL  Routine Chem:  22-Apr-15 04:52   Result Comment INR - RESULTS VERIFIED BY REPEAT TESTING.  - NOTIFIED OF CRITICAL VALUE  - C/ KELLEY ZIELKE '@[id number]'  04-28-13. [facility]  - READ-BACK PROCESS PERFORMED.  Result(s) reported on 28 Apr 2013 at 05:35AM.  Glucose, Serum 82  BUN  64  Creatinine (comp)  11.12  Sodium, Serum 145  Potassium, Serum  3.4  Chloride, Serum  118  CO2, Serum  14  Calcium (Total), Serum  7.7  Anion Gap 13  Osmolality (calc) 306  eGFR (African American)  4  eGFR (Non-African American)  3 (eGFR values <40m/min/1.73 m2 may be an indication of chronic kidney disease (CKD). Calculated eGFR is useful in patients with stable renal function. The eGFR calculation will not be reliable in acutely ill patients when serum creatinine is changing rapidly. It is not useful in  patients on dialysis. The eGFR calculation may not be applicable to patients at the low and high extremes of body sizes, pregnant women,  and vegetarians.)  Routine Coag:  21-Apr-15 12:20   INR 3.8 (INR reference interval applies to patients on anticoagulant therapy. A single INR therapeutic range for coumarins is not optimal for all indications; however, the suggested range for most indications is 2.0 - 3.0. Exceptions to the INR Reference Range may include: Prosthetic heart valves, acute myocardial infarction, prevention of myocardial infarction, and combinations of aspirin and anticoagulant. The need for a higher or lower target INR must be assessed individually. Reference: The Pharmacology and Management of the Vitamin K  antagonists: the seventh ACCP Conference on Antithrombotic and Thrombolytic Therapy. CQQVZD.6387Sept:126 (3suppl): 2N9146842 A HCT value >55% may artifactually increase the PT.  In one study,  the increase was an average of 25%. Reference:  "Effect on Routine and Special Coagulation Testing Values of Citrate Anticoagulant Adjustment in Patients with High HCT Values." American Journal of Clinical Pathology 2006;126:400-405.)  22-Apr-15 04:52   Prothrombin  41.2  INR  4.5 (INR reference interval applies to patients on anticoagulant therapy. A single INR therapeutic range for coumarins is not optimal for all indications; however, the suggested range for most indications is 2.0 - 3.0. Exceptions to the INR Reference Range may include: Prosthetic heart valves, acute myocardial infarction, prevention of myocardial infarction, and combinations of aspirin and anticoagulant. The need for a higher or lower target INR must be assessed individually. Reference: The Pharmacology and Management of the Vitamin K  antagonists: the seventh ACCP Conference on Antithrombotic and Thrombolytic Therapy. CFIEPP.2951Sept:126 (3suppl): 2N9146842 A HCT value >55% may artifactually increase the PT.  In one study,  the increase was an average of 25%. Reference:  "Effect on Routine and Special Coagulation Testing Values of  Citrate Anticoagulant Adjustment in Patients with High HCT Values." American Journal of Clinical Pathology 2006;126:400-405.)  Routine Hem:  22-Apr-15 04:52   WBC (CBC) 6.4  RBC (CBC)  3.35  Hemoglobin (CBC)  8.7  Hematocrit (CBC)  26.9  Platelet Count (CBC) 228  MCV 80  MCH  25.9  MCHC 32.2  RDW  16.9  Neutrophil % 66.9  Lymphocyte % 20.4  Monocyte % 9.9  Eosinophil % 2.0  Basophil % 0.8  Neutrophil # 4.3  Lymphocyte # 1.3  Monocyte # 0.6  Eosinophil # 0.1  Basophil # 0.0 (Result(s) reported on 28 Apr 2013 at 05:50AM.)   Assessment/Plan:  Assessment/Plan:  Assessment 1) chronic diarrhea-marked weight loss, hypoalbuminemia, CKD, anorexia.   Plan as above.   Electronic Signatures: Loistine Simas (MD)  (Signed 22-Apr-15 18:31)  Authored: Chief Complaint, VITAL SIGNS/ANCILLARY NOTES, Brief Assessment, Lab Results, Assessment/Plan   Last Updated: 22-Apr-15 18:31 by Loistine Simas (MD)

## 2014-04-30 NOTE — Consult Note (Signed)
Chief Complaint:  Subjective/Chief Complaint seen for diarrhea.  deneis abd pain or nausea, tolerating po, states diarrhea is improving/near forming.   VITAL SIGNS/ANCILLARY NOTES: **Vital Signs.:   29-Apr-15 14:03  Vital Signs Type Routine  Temperature Temperature (F) 98.1  Celsius 36.7  Pulse Pulse 87  Respirations Respirations 18  Systolic BP Systolic BP 127  Diastolic BP (mmHg) Diastolic BP (mmHg) 75  Mean BP 91  Pulse Ox % Pulse Ox % 91  Pulse Ox Activity Level  At rest  Oxygen Delivery Room Air/ 21 %   Brief Assessment:  Cardiac Regular   Respiratory clear BS   Gastrointestinal details normal Soft  Nontender  Nondistended  Bowel sounds normal   Lab Results: Routine Chem:  29-Apr-15 04:43   Glucose, Serum 94  BUN 7  Creatinine (comp)  3.22  Sodium, Serum 140  Potassium, Serum  3.0  Chloride, Serum 102  CO2, Serum  34  Calcium (Total), Serum  7.9  Anion Gap  4  Osmolality (calc) 277  eGFR (African American) >60  eGFR (Non-African American) >60 (eGFR values <20m/min/1.73 m2 may be an indication of chronic kidney disease (CKD). Calculated eGFR is useful in patients with stable renal function. The eGFR calculation will not be reliable in acutely ill patients when serum creatinine is changing rapidly. It is not useful in  patients on dialysis. The eGFR calculation may not be applicable to patients at the low and high extremes of body sizes, pregnant women, and vegetarians.)  Routine Coag:  29-Apr-15 04:43   Prothrombin 14.4  INR 1.1 (INR reference interval applies to patients on anticoagulant therapy. A single INR therapeutic range for coumarins is not optimal for all indications; however, the suggested range for most indications is 2.0 - 3.0. Exceptions to the INR Reference Range may include: Prosthetic heart valves, acute myocardial infarction, prevention of myocardial infarction, and combinations of aspirin and anticoagulant. The need for a higher or  lower target INR must be assessed individually. Reference: The Pharmacology and Management of the Vitamin K  antagonists: the seventh ACCP Conference on Antithrombotic and Thrombolytic Therapy. CNTZGY.1749Sept:126 (3suppl): 2N9146842 A HCT value >55% may artifactually increase the PT.  In one study,  the increase was an average of 25%. Reference:  "Effect on Routine and Special Coagulation Testing Values of Citrate Anticoagulant Adjustment in Patients with High HCT Values." American Journal of Clinical Pathology 2006;126:400-405.)   Radiology Results: [place]    29-Apr-15 11:46, [place]  [place]   REASON FOR EXAM:    esophageal varices, please do dopplers of the portal   splenic and hepatic veins.  COMMENTS:       PROCEDURE: [place] - [place]AL [place]VEY  - May 05 2013 11:46AM     CLINICAL DATA:  Esophageal varices.    EXAM:  ULTRASOUND ABDOMEN COMPLETE    COMPARISON:  None.    FINDINGS:  Gallbladder:  No gallstones or wall thickening visualized. No sonographic Murphy  sign noted.    Common bile duct:    Diameter: 4 mm    Liver:    No focal lesion identified. Within normal limits in parenchymal  echogenicity.    IVC:    No abnormality visualized.  Pancreas:    Visualized portion unremarkable.    Spleen:    Size and appearance within normal limits.    Right Kidney:    Length: 9.3 cm. Diffusely increased renal parenchymal echogenicity.  Tiny less than 1 cm cyst  noted in the upper and lower poles, however  no renal mass identified. No evidence hydronephrosis.    Left Kidney:  Length: 10.5 cm. Diffusely increased renal parenchymal echogenicity.  No mass or hydronephrosis visualized.    Abdominal aorta:    No aneurysm visualized.    Other findings:    Tiny left pleural effusion incidentally noted.     IMPRESSION:  No evidence of gallstones or biliary dilatation.    Normal sonographic appearance of the liver. No  evidence of  splenomegaly or ascites.    Diffusely increased renal parenchymal echogenicity, consistent with  medical renal disease. No evidence of hydronephrosis.      Electronically Signed    By: Earle Gell M.D.    On: 05/05/2013 12:19         Verified By: Marlaine Hind, M.D.,   Assessment/Plan:  Assessment/Plan:  Assessment 1) diarrhea-uncertain etiology, but improving. negative cultures, normal fecal elastase. colonoscopy eseveral small polyps removed, random mucosal biopsies results pending.   On probiotic. 2) severe erosibe gastritis, on bid ppi. continue   Plan as above, will need o/p gi fu in about 3-4 weeks and repeat egd in about 8 weeks.   Electronic Signatures: Loistine Simas (MD)  (Signed 29-Apr-15 15:58)  Authored: Chief Complaint, VITAL SIGNS/ANCILLARY NOTES, Brief Assessment, Lab Results, Radiology Results, Assessment/Plan   Last Updated: 29-Apr-15 15:58 by Loistine Simas (MD)

## 2014-04-30 NOTE — Consult Note (Signed)
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 Dede Query to evaluate and provide recommendations regarding need for renal replacement therapy. 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 With steroids and prograf, her creatinine improved to 2.0 in early 2014 but then has progressively risen to 2.8 in April 2014 --> 3.9 in Nov 2014 and 7.2 in Jan 2015. It was stable in March at 7.4, however, on admission her creatinine was up to 11.0. The reason for her presentations was progressive diarrhea. She goes between 5-7 times daily. She denies vomiting but does have 'dry heaves', worse in the morning. She continues to maintain a good appetite and energy. She's urinating normally and denies edema. No CP, SOB, cough palpitations, arthralgias, myalgias, hematemesis or BRBPR/melena. The remainder of the 10 system review is negative.   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 Clide Deutscher   Family History Sister had ESRD presumed due to diabetes   HEENT PERLA  AI; OP clear   Neck/Nodes Supple  No Adenopathy    Chest/Lungs Clear   Cardiovascular Normal Sinus Rhythm   Abdomen Benign   Musculoskeletal Full range of motion   Neurological Grossly WNL   Skin Warm  Dry   Nursing/Ancillary Notes: **Vital Signs.:   22-Apr-15 14:00  Vital Signs Type Routine  Temperature Temperature (F) 97.9  Celsius 36.6  Temperature Source oral  Pulse Pulse 74  Respirations Respirations 18  Systolic BP Systolic BP 500  Diastolic BP (mmHg) Diastolic BP (mmHg) 89  Mean BP 107  Pulse Ox % Pulse Ox % 100  Pulse Ox Activity Level  At rest  Oxygen Delivery Room Air/ 21 %  *Intake and Output.:   Daily 22-Apr-15 07:00  Grand Totals Intake:  1169 Output:  250    Net:  919 24 Hr.:  919  Oral Intake      In:  180  IV (Primary)      In:  989  Urine ml     Out:  250  Length of Stay Totals Intake:  1169 Output:  250    Net:  919     Hepatic:  21-Apr-15 12:20   Bilirubin, Total 0.2  Alkaline Phosphatase 109 (45-117 NOTE: New Reference Range 11/27/12)  SGPT (ALT) 18  SGOT (AST) 15  Total Protein, Serum  6.1  Albumin, Serum  1.9  Routine Micro:  21-Apr-15 12:20   Micro Text Report URINE CULTURE   ORGANISM 1  30,000 CFU/ML GRAM NEGATIVE ROD   COMMENT                   ID TO FOLLOW SENSITIVITIES TO FOLLOW   ANTIBIOTIC                       Culture Comment ID TO FOLLOW SENSITIVITIES TO FOLLOW  Result(s) reported on 28 Apr 2013 at 11:10AM.  Specimen Source [place]  Organism Name GRAM NEGATIVE ROD  Organism Quantity 30,000 CFU/ML  Organism 1 30,000 CFU/ML GRAM NEGATIVE ROD  Routine Chem:  21-Apr-15 12:20   Glucose, Serum 99  BUN  62  Creatinine (comp)  11.45  Sodium, Serum 142  Potassium, Serum 3.5  Chloride, Serum  112  CO2, Serum  18  Calcium (Total), Serum  8.3  Anion Gap 12  Osmolality (calc) 301  eGFR (African American)  4  eGFR (Non-African American)  3 (eGFR values <43m/min/1.73 m2 may be an indication of chronic kidney disease (CKD). Calculated eGFR is useful  in patients with stable renal function. The eGFR calculation will not be reliable in acutely ill patients when serum creatinine is changing rapidly. It is not useful in  patients on dialysis. The eGFR calculation may not be applicable to patients at the low and high extremes of body sizes, pregnant women, and vegetarians.)  Routine UA:  21-Apr-15 12:20   Color (UA) Yellow  Clarity (UA) Cloudy  Glucose (UA) 150 mg/dL  Bilirubin (UA) Negative  Ketones (UA) Negative  Specific Gravity (UA) 1.033  Blood (UA) 2+  pH (UA) 5.0  Protein (UA) >=500  Nitrite (UA) Negative  Leukocyte Esterase (UA) Negative (Result(s) reported on 27 Apr 2013 at 01:07PM.)  RBC (UA) 18 /HPF  WBC (UA) 22 /HPF  Bacteria (UA) 1+  Epithelial Cells (UA) 4 /HPF  Mucous (UA) PRESENT  Hyaline Cast (UA) 3 /LPF  Amorphous Crystal (UA) PRESENT (Result(s) reported on 27 Apr 2013 at 01:07PM.)  Routine Coag:  21-Apr-15 12:20   Prothrombin  35.9  INR 3.8 (INR reference interval applies to patients on anticoagulant therapy. A single INR therapeutic range for coumarins is not optimal for all indications; however, the suggested range for most indications is 2.0 - 3.0. Exceptions to the INR Reference Range may include: Prosthetic heart valves, acute myocardial infarction, prevention of myocardial infarction, and combinations of aspirin and anticoagulant. The need for a higher or lower target INR must be assessed individually. Reference: The Pharmacology and Management of the Vitamin K  antagonists: the seventh ACCP Conference on Antithrombotic and Thrombolytic Therapy. CDXAJO.8786Sept:126 (3suppl): 2N9146842 A HCT value >55% may artifactually increase the PT.  In one study,  the increase was an average of 25%. Reference:  "Effect on Routine and Special Coagulation Testing Values of Citrate Anticoagulant Adjustment in Patients with High HCT Values." American Journal of Clinical Pathology 2006;126:400-405.)  Routine Hem:   21-Apr-15 12:20   WBC (CBC) 8.5  RBC (CBC) 4.21  Hemoglobin (CBC)  10.8  Hematocrit (CBC)  33.7  Platelet Count (CBC) 340  MCV 80  MCH  25.7  MCHC 32.1  RDW  17.3  Neutrophil % 72.7  Lymphocyte % 18.2  Monocyte % 6.6  Eosinophil % 2.0  Basophil % 0.5  Neutrophil # 6.2  Lymphocyte # 1.5  Monocyte # 0.6  Eosinophil # 0.2  Basophil # 0.0 (Result(s) reported on 27 Apr 2013 at 12:38PM.)     [place]    21-Apr-15 15:47, [place]  [place] Kidney Bilateral   REASON FOR EXAM:    acute renal fialure  COMMENTS:       PROCEDURE: [place]  - [place] KIDNEY  - Apr 27 2013  3:47PM     CLINICAL DATA:  Renal failure.    EXAM:  RENAL/URINARY TRACT ULTRASOUND COMPLETE    COMPARISON:  None.    FINDINGS:  Right Kidney:  Length:10.1 cm. Increased echogenicity right kidney. Two small  approximate 1.2 cm cyst right upper renal pole. No hydronephrosis.    Left Kidney:    Length: 10.0 cm. Increased echogenicity left kidney. Two small  approximately 1.1 cm cyst upper pole . No hydronephrosis    Bladder:    Bladder is small and nondistended.     IMPRESSION:  Increased echogenicity both kidneys consistent with chronic medical  renal disease. No hydronephrosis or bladder distention.  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   Plan Teresa Cook is not ready at this point to commit to dialysis. Would recommend GI consult with endoscopy and stool studies to rule out other potential causes of her GI symptoms. If these are negative, will recommend she initiate hemodialysis via CVC. Hold renvela for now as this could also be causing her GI symptoms. Check phosphorus in a couple of days so we know what it is without binders. Appears euvolemic and hence would d/c IV fluids when  not NPO for procedures. I spent 60 minutes with the patient today and spent 50% of this time counseling and educating the patient on the natural history of ESRD/dialysis and RRT options.   Thank you for calling us to participate in her care.   Electronic Signatures: Glendy Barsanti, Simeon Craft (MD)  (Signed 22-Apr-15 17:45)  Authored: Health Issues, General Aspect/Present Illness, Home Medications, Allergies, History and Physical Exam, Vital Signs, Labs, Radiology, Impression/Plan   Last Updated: 22-Apr-15 17:45 by Garnette Czech (MD)

## 2014-04-30 NOTE — Discharge Summary (Signed)
PATIENT NAME:  Teresa Cook, Teresa Cook MR#:  [id number] DATE OF BIRTH:  09/22/1955  DATE OF ADMISSION:  10/04/2013 DATE OF DISCHARGE:  10/07/2013  ADMISSION DIAGNOSES: Acute respiratory failure with hypoxemia secondary to pulmonary edema.   DISCHARGE DIAGNOSES:  1.  Acute respiratory failure with hypoxemia secondary to pulmonary edema. 2.  End-stage renal disease on hemodialysis.  3.  Suspected pneumonia, ruled out.  4.  History of deep venous thrombosis and pulmonary embolism.  5.  Hypertension.  6.  Possible seizure.   CONSULTATIONS:  Nephrology and Neurology.   LABORATORIES AT DISCHARGE: White blood cell count 8.6, hemoglobin 12.3, hematocrit 37.9, platelets 174,000. Blood culture is negative to date. INR 2.9.   HOSPITAL COURSE: A 59 year old female who presented with acute respiratory failure and hypoxemia, was urgently intubated in the ER.  For further details, please refer to H and P.  1. Acute respiratory failure with hypoxemia, likely related to pulmonary edema and volume overload from her end-stage renal disease. Pneumonia has been ruled out. She underwent urgent hemodialysis on admission and is clinically doing better. She was extubated on 10/05/2013. She was empirically started on antibiotics and all of these have been discontinued as pneumonia has been ruled out. She is hemodynamically stable and afebrile. Blood culture negative to date.  2. End-stage renal disease on hemodialysis. We appreciate nephrology consult. She will continue dialysis Monday, Wednesday, Friday.  3. Suspected pneumonia has been ruled out. IV antibiotics have been discontinued. Blood cultures are negative.  4. 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 The patient will continue on Coumadin.  5. GERD on Protonix.  6. Hypertension, on metoprolol.  7. Secondary hyperparathyroidism. The patient is on calcium.  8. Possible seizure, appreciate neurology evaluation. May have been provoked by hypoxia. No history  of seizures. The patient will continue on Keppra for a total of 7 days. She has 3 more days.   DISCHARGE MEDICATIONS:  1. Calcium acetate 667 mg t.i.d.  2. Metoprolol 100 mg b.i.d.  3. Losartan 50 mg daily.  4. Pantoprazole 40 mg b.i.d.  5. Coumadin 7.5 mg 4 times a week, Sunday, Wednesday, Friday, Saturday.  6. Coumadin 5 mg 3 times a week, Monday, Tuesday, Thursday.  7. Zoloft 100 mg 2 tablets daily.  8. Tramadol 50 mg q. 8 hours p.r.n.  9. Requip 0.25 mg at bedtime p.r.n.  10. Keppra 500 mg b.i.d. for 3 days.   DISCHARGE DIET: Low sodium renal diet.   DISCHARGE ACTIVITY: As tolerated.   DISCHARGE FOLLOWUP: The patient needs to follow up with Dr. Clide Deutscher in 1 week. She will resume dialysis as scheduled.  The patient was stable for discharge.   TIME SPENT: 35 minutes   ____________________________ Brylen Wagar P. Benjie Karvonen, MD spm:lt D: 10/07/2013 11:50:00 ET T: 10/07/2013 13:43:10 ET JOB#: [id number]  cc: Ngwe A. Clide Deutscher, MD Mylinh Cragg P. Benjie Karvonen, MD, <Dictator>   Donell Beers Jeneane Pieczynski MD ELECTRONICALLY SIGNED 10/07/2013 21:41

## 2014-04-30 NOTE — Consult Note (Signed)
PATIENT NAME:  Teresa Cook, Teresa Cook MR#:  [id number] DATE OF BIRTH:  March 17, 1955  DATE OF CONSULTATION:  05/14/2013  REFERRING PHYSICIAN:  Dr. Leslye Peer. CONSULTING PHYSICIAN:  Sammuel Hines. Richardson Landry, MD  REASON FOR CONSULTATION:  Hoarseness.   HISTORY OF PRESENT ILLNESS:  A 59 year old female who was just extubated 2 days ago and is having hoarseness and loss of vocal volume. She does have some throat irritation. She was originally admitted on 05/08/2013 for dizziness and shortness of breath. She is diagnosed with acute respiratory failure ultimately requiring intubation. Respiratory failure was felt to be secondary to pulmonary edema from end-stage renal disease and acute CHF exacerbation. She has had some dysphagia as well. She has had a swallowing evaluation which showed no evidence of obvious aspiration including trials of ice chips, thin liquids and purees. They have recommended a dysphagia diet and felt part of her problem was just continued issues with drowsiness post extubation but did not see any obvious issues with aspiration.   PAST MEDICAL HISTORY:  A history of gastritis, esophageal varices, gout, polycythemia, kidney failure, depression, hypertension, DVT.   PAST SURGICAL HISTORY:  Hysterectomy, appendectomy.   ALLERGIES:  None.  SOCIAL HISTORY:  The patient lives at home with her husband. No history of smoking or alcohol abuse.   FAMILY HISTORY:  Significant blood clots.   REVIEW OF SYSTEMS:  Has not had any fever, chills, nausea, vomiting, diarrhea.   MEDICATIONS:  Tylenol 325 mg 2 p.o. every 4 hours p.r.n. pain, calcium acetate 1 p.o. t.i.d. with meals, Probiotic capsule 1 p.o. b.i.d., morphine 2 mg IV q.1 hour p.r.n. pain, Protonix 40 mg p.o. q.a.m., Dulcolax 10 mg rectally p.r.n., Coumadin 10 mg p.o. q.11 a.m.  PHYSICAL EXAMINATION:  VITAL SIGNS:  Temperature is 98.4, pulse 100, blood pressure 134/81.   GENERAL:  Well-developed, well-nourished female, obviously still a little  bit groggy but in no acute distress with no stridor. Her voice is clearly breathy and hoarse.  HEAD AND FACE:  Normocephalic, atraumatic. No facial skin lesions. Facial strength is normal.  EARS:  External ears, ear canals, tympanic membranes are clear bilaterally. There is no middle ear effusion or infection.  NOSE:  External nose unremarkable. Nasal cavity is clear. No purulence or polyps are seen. CAVITY AND OROPHARYNX:  Teeth, lips, and gums unremarkable. Tongue and floor of mouth without lesions. Posterior pharynx is clear with no erythema or exudate or swelling.  NECK:  Supple without adenopathy or mass. There is no thyromegaly. Salivary glands are soft and nontender without masses.  NEUROLOGIC EXAM:  Cranial nerves II through XII are grossly intact.    PROCEDURE NOTE:   PREOPERATIVE DIAGNOSIS:  Hoarseness, dysphagia.   POSTOPERATIVE DIAGNOSIS:  Hoarseness, dysphagia.   PROCEDURE:  Flexible fiber-optic nasal laryngoscopy.   SURGEON:  Jill Poling, MD.   ANESTHESIA:  Topical.   DESCRIPTION OF PROCEDURE:  After discussing the procedure with the patient, the left nasal cavity was topically anesthetized with 4% lidocaine and decongested with Afrin. A flexible fiber-optic scope was passed through the left nasal cavity. The nasopharynx was found to be clear. The hypopharynx, larynx and tongue base were inspected. The vocal cords are mobile with some mild erythema and edema, but no evidence of thrush, exudate, granuloma, granulation tissue or nodules or polyps. There is some erythema and edema of the posterior glottis typical of reflux as well as some irritation between the cords likely from recent intubation but nothing worrisome. Hypopharynx and tongue base are unremarkable. The  scope was withdrawn. The patient tolerated the procedure well.   ASSESSMENT:  The patient with hoarseness secondary to recent intubation. She has reactive laryngitis due to irritation from the tube but she has  normal vocal cord mobility. There is a little bit of posterior glottic irritation that can be seen with reflux but she is on Protonix which should help control this and should improve as she gets more upward and mobile. As far as dysphagia, I do not have anything to add other than keeping her on reflux management. She has had a speech and swallowing evaluation and they felt the majority of her problem was continued drowsiness with reduction in the oral phase of swallowing. This should improve as she continues to recover. If she has continued swallowing difficulties, a barium swallow could be considered in the future.    ____________________________ Sammuel Hines. Richardson Landry, MD psb:jm D: 05/14/2013 12:26:33 ET T: 05/14/2013 12:49:03 ET JOB#: [id number]  cc: Sammuel Hines. Richardson Landry, MD, <Dictator> Riley Nearing MD ELECTRONICALLY SIGNED 05/27/2013 19:12

## 2014-04-30 NOTE — Consult Note (Signed)
Chief Complaint:  Subjective/Chief Complaint seen for chronic diarrhea and weight loss. tolerated prep for colonoscopy well.  denies n/v or abdominal pain.  mild n with prep last night.   VITAL SIGNS/ANCILLARY NOTES: **Vital Signs.:   27-Apr-15 09:55  Vital Signs Type Q 4hr  Celsius 37  Temperature Source oral  Pulse Pulse 84  Respirations Respirations 18  Systolic BP Systolic BP [id number]  Diastolic BP (mmHg) Diastolic BP (mmHg) 85  Mean BP 110  Pulse Ox % Pulse Ox % 97  Pulse Ox Activity Level  At rest  Oxygen Delivery Room Air/ 21 %  *Intake and Output.:   27-Apr-15 01:29  Stool  diarhea patient said she had 3 liquidy stools. getting colonoscopy prep.   Brief Assessment:  Cardiac Regular   Respiratory clear BS   Gastrointestinal details normal Soft  Nontender  Nondistended  No masses palpable  Bowel sounds normal   Lab Results: Routine Coag:  27-Apr-15 04:56   Prothrombin  14.8  INR 1.2 (INR reference interval applies to patients on anticoagulant therapy. A single INR therapeutic range for coumarins is not optimal for all indications; however, the suggested range for most indications is 2.0 - 3.0. Exceptions to the INR Reference Range may include: Prosthetic heart valves, acute myocardial infarction, prevention of myocardial infarction, and combinations of aspirin and anticoagulant. The need for a higher or lower target INR must be assessed individually. Reference: The Pharmacology and Management of the Vitamin K  antagonists: the seventh ACCP Conference on Antithrombotic and Thrombolytic Therapy. [id number] Sept:126 (3suppl): X2190819. A HCT value >55% may artifactually increase the PT.  In one study,  the increase was an average of 25%. Reference:  "Effect on Routine and Special Coagulation Testing Values of Citrate Anticoagulant Adjustment in Patients with High HCT Values." American Journal of Clinical Pathology 2006;126:400-405.)  Routine Hem:  26-Apr-15 09:21    Platelet Count (CBC) 192   Assessment/Plan:  Assessment/Plan:  Assessment 1) weight loss and chronic diarrhea.   2) acute on chronic renal disease,  3) h/o DVT, on chronic coumadin tx.   Plan 1) for egd and colonoscopy today.  I have discussed the risks benefits and complications of proceedures to include not limited to bleeding infection perforation and sdation and she wishes to proceed. further recs to follow.   Electronic Signatures: Loistine Simas (MD)  (Signed 27-Apr-15 16:55)  Authored: Chief Complaint, VITAL SIGNS/ANCILLARY NOTES, Brief Assessment, Lab Results, Assessment/Plan   Last Updated: 27-Apr-15 16:55 by Loistine Simas (MD)

## 2014-04-30 NOTE — Consult Note (Signed)
Chief Complaint:  Subjective/Chief Complaint Covering for Dr. Gustavo Lah. Had 1st dialysis yest. Chronic diarrhea. Typically, 4-5 loose stools per day. No other GI sxs. Stool tests neg.   VITAL SIGNS/ANCILLARY NOTES: **Vital Signs.:   25-Apr-15 04:53  Vital Signs Type Routine  Temperature Temperature (F) 98.2  Celsius 36.7  Temperature Source oral  Pulse Pulse 81  Respirations Respirations 18  Systolic BP Systolic BP [id number]  Diastolic BP (mmHg) Diastolic BP (mmHg) 69  Mean BP 84  Pulse Ox % Pulse Ox % 97  Pulse Ox Activity Level  At rest  Oxygen Delivery Room Air/ 21 %   Brief Assessment:  GEN no acute distress   Cardiac Regular   Respiratory clear BS   Gastrointestinal Normal   Lab Results:  General Ref:  24-Apr-15 04:44   Transferrin, Serum ========== TEST NAME ==========  ========= RESULTS =========  = REFERENCE RANGE =  TRANSFERRIN  Transferrin Transferrin                     [L  119 mg/dL            ]           [address]               [facility]            No: [id number]           [address], [place], [place] [address]           Lindon Romp, MD         [phone]   Result(s) reported on 01 May 2013 at 07:48AM.    15:11   HBsAg ========== TEST NAME ==========  ========= RESULTS =========  = REFERENCE RANGE =  HEPATITIS B SURFACE AG  HBsAg Screen HBsAg Screen                    [   Negative             ]          Negative               [facility]            No: [id number]           [address] [place], [place] [address]           William F Hancock, MD         [phone]   Result(s) reported on 01 May 2013 at 07:48AM.  Routine Chem:  24-Apr-15 15:11   Cholesterol, Serum  335  Triglycerides, Serum  225  HDL (INHOUSE)  32  VLDL Cholesterol Calculated  45  LDL Cholesterol Calculated  258 (Result(s) reported on 30 Apr 2013 at 04:14PM.)  Iron Binding Capacity (TIBC)  135  Unbound Iron Binding Capacity 80  Iron, Serum  55  Iron Saturation 41 (Result(s) reported on 30 Apr 2013 at 04:10PM.)  Ferritin [facility]) 289 (Result(s) reported on 30 Apr 2013 at 04:14PM.)  Routine Coag:  24-Apr-15 04:44   Prothrombin  20.7  INR 1.8 (INR reference interval applies to patients on anticoagulant therapy. A single INR therapeutic range for coumarins is not optimal for all indications; however, the suggested range for most indications is 2.0 - 3.0. Exceptions to the INR Reference Range may include: Prosthetic heart valves, acute myocardial infarction, prevention of myocardial infarction, and combinations of aspirin and anticoagulant. The need for a higher or lower  target INR must be assessed individually. Reference: The Pharmacology and Management of the Vitamin K  antagonists: the seventh ACCP Conference on Antithrombotic and Thrombolytic Therapy. [id number] Sept:126 (3suppl): X2190819. A HCT value >55% may artifactually increase the PT.  In one study,  the increase was an average of 25%. Reference:  "Effect on Routine and Special Coagulation Testing Values of Citrate Anticoagulant Adjustment in Patients with High HCT Values." American Journal of Clinical Pathology 2006;126:400-405.)  25-Apr-15 04:50   Prothrombin  16.1  INR 1.3 (INR reference interval applies to patients on anticoagulant therapy. A single INR therapeutic range for coumarins is not optimal for all indications; however, the suggested range for most indications is 2.0 - 3.0. Exceptions to the INR Reference Range may include: Prosthetic heart valves, acute myocardial infarction, prevention of myocardial infarction, and combinations of aspirin and anticoagulant. The need for a higher or lower target INR must be assessed individually. Reference: The Pharmacology and Management of the Vitamin K  antagonists: the seventh ACCP Conference on Antithrombotic and Thrombolytic Therapy. [id number] Sept:126 (3suppl): [id number]. A HCT value >55% may artifactually  increase the PT.  In one study,  the increase was an average of 25%. Reference:  "Effect on Routine and Special Coagulation Testing Values of Citrate Anticoagulant Adjustment in Patients with High HCT Values." American Journal of Clinical Pathology 2006;126:400-405.)   Assessment/Plan:  Assessment/Plan:  Assessment Chronic diarrhea.   Plan If patient remains stable, will bowel prep patient tomorrow afternoon for EGD/colon on Monday by Dr. Gustavo Lah. INR is less than 1.5 now. Since stool for [place] fat not available, will order stool for pancreatic elastase.   Electronic Signatures: Verdie Shire (MD)  (Signed 25-Apr-15 10:31)  Authored: Chief Complaint, VITAL SIGNS/ANCILLARY NOTES, Brief Assessment, Lab Results, Assessment/Plan   Last Updated: 25-Apr-15 10:31 by Verdie Shire (MD)

## 2014-04-30 NOTE — Consult Note (Signed)
PATIENT NAME:  Teresa Cook, MATOTT MR#:  [id number] DATE OF BIRTH:  08-08-1955  DATE OF CONSULTATION:  12/28/2013  REFERRING PHYSICIAN:  Volanda Napoleon CONSULTING PHYSICIAN:  Isaias Cowman, MD  PRIMARY CARE PHYSICIAN: Clide Deutscher, at [facility].   CHIEF COMPLAINT: Shortness of breath.   REASON FOR CONSULTATION: Consultation requested for evaluation of acute on chronic systolic congestive heart failure and elevated troponin.   HISTORY OF PRESENT ILLNESS: The patient is a 59 year old female with history of end-stage renal disease, on chronic hemodialysis, and history of chronic systolic congestive heart failure with LV ejection fraction of 40%. The patient was admitted on 12/26/2013 with progressive shortness of breath. She presented to [facility] Emergency Room where she was noted to be hypoxic, treated with BiPAP, and admitted. Admission labs were notable for borderline elevated troponin of 0.08 in the absence of chest pain or new ECG changes. The patient appears clinically improved after initial diuresis.   PAST MEDICAL HISTORY: 1.  End-stage renal disease, on chronic hemodialysis.  2.  Chronic systolic congestive heart failure with mildly reduced left ventricular function.  3.  Hypertension.  4.  History of DVT and PE, on chronic anticoagulation.   MEDICATIONS: Warfarin 5 mg Monday, Tuesday, Thursday, 7.5 mg Sunday, Wednesday, Friday, and Saturday, losartan 50 mg daily, metoprolol tartrate 100 mg b.i.d., Zoloft 100 mg 2 tabs at bedtime, calcium acetate 667 mg t.i.d., pantoprazole 40 mg b.i.d.   SOCIAL HISTORY: The patient denies tobacco or EtOH abuse.   FAMILY HISTORY: No immediate family history of myocardial infarction.   REVIEW OF SYSTEMS: CONSTITUTIONAL: No fever or chills.  EYES: No blurry vision.  EARS: No hearing loss.  RESPIRATORY: Shortness of breath as described above.  CARDIOVASCULAR: No chest pain.  GASTROINTESTINAL: No nausea, vomiting, or diarrhea.  GENITOURINARY: No  dysuria or hematuria.  ENDOCRINE: No polyuria or polydipsia.  MUSCULOSKELETAL: No arthralgias or myalgias.  NEUROLOGICAL: No focal muscle weakness or numbness.  PSYCHOLOGICAL: No depression or anxiety.   PHYSICAL EXAMINATION: VITAL SIGNS: Blood pressure 150/79, pulse 70, respirations 18, temperature 97.5, pulse oximetry 99%.  HEENT: Pupils equal and reactive to light and accommodation.  NECK: Supple without thyromegaly.  LUNGS: Clear.  CARDIOVASCULAR: Normal JVP. Normal PMI. Regular rate and rhythm. Normal S1, S2. No appreciable gallop, murmur, or rub.  ABDOMEN: Soft and nontender. Pulses were intact bilaterally.  MUSCULOSKELETAL: Normal muscle tone.  NEUROLOGIC: The patient is alert and oriented x 3. Motor and sensory both grossly intact.   IMPRESSION: A 59 year old female who presents with respiratory failure, likely multifactorial, with acute on chronic systolic congestive heart failure, exacerbated by dietary noncompliance with borderline elevated troponin likely due to demand supply ischemia in the absence of chest pain or new ECG changes.   RECOMMENDATIONS: 1.  Defer full dose anticoagulation.  2.  Agree with overall current therapy.  3.  Add isosorbide mononitrate and hydralazine to current heart failure regimen.  4.  No further cardiac diagnostics at this time.   ____________________________ Isaias Cowman, MD ap:at D: 12/28/2013 09:09:03 ET T: 12/28/2013 09:20:17 ET JOB#: [id number]  cc: Isaias Cowman, MD, <Dictator> Isaias Cowman MD ELECTRONICALLY SIGNED 01/04/2014 13:09

## 2014-04-30 NOTE — H&P (Signed)
PATIENT NAME:  Teresa Cook, Teresa Cook MR#:  [id number] DATE OF BIRTH:  1955/12/10  DATE OF ADMISSION:  10/04/2013  PRIMARY CARE PHYSICIAN: Ngwe A. Clide Deutscher, MD and Princella Ion  CHIEF COMPLAINT: Shortness of breath.   HISTORY OF PRESENT ILLNESS: This is a 59 year old female who presented to the hospital with acute respiratory distress and shortness of breath that began earlier this morning. The patient presented to the hospital with oxygen saturation in the 60s. She was noted to be in severe respiratory distress and therefore was originally intubated. Most of the history is presently obtained from the husband at bedside. As per the husband, the patient was in her usual state of health and did not have any complaints yesterday. This morning, she woke up and she was having significant shortness of breath and therefore EMS was called. She was brought here. As per her husband, the patient denied any fevers, chills, cough, chest pain, any worsening shortness of breath or any other complaints over the past few days.   The patient presented to the hospital with no acute respiratory failure with hypoxemia and was urgently intubated. Hospice services were contacted for further treatment and evaluation.   REVIEW OF SYSTEMS: Unobtainable, given the patient's mental status, as she is intubated and sedated presently.   PAST MEDICAL HISTORY: Consistent with end-stage renal disease on hemodialysis Monday/Wednesday/Friday, hypertension, history of previous PE and DVT, history of a right IJ thrombus, secondary hyperparathyroidism, GERD, depression, restless leg syndrome.   ALLERGIES: No known drug allergies.   SOCIAL HISTORY: No smoking, no alcohol abuse. No illicit drug abuse. Lives at home with her husband.   FAMILY HISTORY: The patient's mother is deceased; died from a brain aneurysm. The husband cannot recall what her father died from.   CURRENT MEDICATIONS: Calcium acetate 667 mg 1 capsule t.i.d. with meals,  losartan 50 mg daily, metoprolol tartrate 100 mg b.i.d., Protonix 40 mg b.i.d., Requip 0.25 mg at bedtime as needed, tramadol 50 mg every 8 hours as needed, warfarin 5 mg t.i.d. on Monday/Tuesday/Thursday and warfarin 7.5 mg on Sunday/Wednesday/Friday/Saturday.  Zoloft 100 mg 2 tablets daily.   PHYSICAL EXAMINATION:  VITAL SIGNS: Presently temperature is 99.1, pulse 94, respirations 20, blood pressure 106/84, saturating 98% on a ventilator.  GENERAL: She is a sedated, intubated female, critically ill-appearing.  HEAD, EYES, EARS, NOSE, THROAT: Atraumatic, normocephalic. Her extraocular muscles are intact. The pupils are equal and reactive to light. Her sclerae are anicteric. No conjunctival injection. No pharyngeal erythema.  NECK: Supple. There is no jugular venous distention. No bruits, no lymphadenopathy, no thyromegaly. HEART: Regular rate and rhythm. No murmurs, no rubs, no clicks.  LUNGS: Clear to auscultation anteriorly. No rales or rhonchi. No wheezes.  ABDOMEN: Soft, flat, nontender, nondistended. Has good bowel sounds. No hepatosplenomegaly appreciated.  EXTREMITIES: No evidence of any cyanosis or clubbing. Trace pedal edema bilaterally. No cyanosis. There are +2 pedal and radial pulses bilaterally.  NEUROLOGIC: She is sedated and debated, therefore difficult do a full neurological exam.  SKIN: Moist and warm with no rashes.  LYMPHATIC: There is no cervical or axillary lymphadenopathy.   LABORATORY DATA: Serum glucose of 253, BUN 46, creatinine 8.7, sodium 136, potassium 5.4, chloride 103, bicarbonate 20. LFTs are within normal limits. Troponin less than 0.02. White cell count 18.2, hemoglobin 13.5, hematocrit 45.1, platelet count is still pending. INR is 1.5.   The patient did have a chest x-ray done which showed cardiomegaly with asymmetric bilateral relatively diffuse airspace disease secondary to asymmetrical  edema or diffuse infection.   ASSESSMENT AND PLAN: This is a 59 year old  female with a history of end-stage renal disease on hemodialysis, hypertension, history of PE, deep vein thrombosis, history of right IJ thrombus secondary to hyperparathyroidism, GERD, depression, who presents to the hospital due to shortness of breath and noted to be in acute respiratory failure with hypoxemia.  1.  Acute respiratory failure with hypoxemia. This is likely related to pulmonary edema and volume overload. Also possible suspected pneumonia given her bilateral infiltrates and an elevated white cell count. The patient will get urgent hemodialysis to get fluid removed. I will also empirically start her on IV antibiotics with vancomycin and Zosyn. Follow sputum and blood cultures. Will get a pulmonary consult to help [place] with the ventilator management. Follow serial ABGs and follow her clinically.  2.  Acute end-stage renal disease on hemodialysis. The patient gets dialysis on Monday/Wednesday/Friday. Currently needs to get hemodialysis urgently for volume overload.  3.  Suspected pneumonia. This is a clinical diagnosis given bilateral pulmonary infiltrates on chest x-ray and a leukocytosis. I will empirically start the patient on vancomycin and Zosyn. Follow blood and sputum cultures.  4.  History of deep vein thrombosis and pulmonary embolus. Continue with Coumadin. INR is presently subtherapeutic.  5.  Gastroesophageal reflux disease. Continue Protonix.  6.  Hypertension. Continue with her metoprolol and losartan.  7.  Secondary hyperparathyroidism. Continue calcium acetate.   CODE STATUS: The patient is a Full Code.   CRITICAL CARE TIME SPENT: 50 minutes    ____________________________ Belia Heman. Verdell Carmine, MD vjs:MT D: 10/04/2013 09:48:36 ET T: 10/04/2013 10:03:56 ET JOB#: [id number]  cc: Belia Heman. Verdell Carmine, MD, <Dictator> [name] MD ELECTRONICALLY SIGNED 10/04/2013 15:11

## 2014-04-30 NOTE — Consult Note (Signed)
Chief Complaint:  Subjective/Chief Complaint Please see full egd and colonosocopy report.  Severe erosive gastritis, likely from chronic nsaid use for self treatment of headaches.   recommend avoiding any nsaid, alternate medical treatment for headaches.  Will start bid ppi, reduce to daily after a month. Awaiting biopsy results.  In regard to diarrhea, awaiting biopsy result.  Start [employer] colon health daily. Advance diet fron full liquid to regular as tolerated.   Electronic Signatures: Loistine Simas (MD)  (Signed 27-Apr-15 21:41)  Authored: Chief Complaint   Last Updated: 27-Apr-15 21:41 by Loistine Simas (MD)

## 2014-04-30 NOTE — Discharge Summary (Signed)
PATIENT NAME:  Teresa Cook, Teresa Cook MR#:  [id number] DATE OF BIRTH:  Jul 29, 1955  DATE OF ADMISSION:  04/27/2013 DATE OF DISCHARGE:  05/06/2013  ADMITTING PHYSICIAN:  Gladstone Lighter, M.D.  DISCHARGING PHYSICIAN:  Gladstone Lighter, M.D.   PRIMARY CARE PHYSICIAN: Dr. Clide Cook.   PRIMARY NEPHROLOGIST: Dr. Domingo Cook from [facility] nephrology in [place].   CONSULTATIONS IN THE HOSPITAL:  [facility] nephrology with Dr. Urban Cook.  2.  Nephrology consultation by Dr. Anthonette Cook and Teresa Cook.  3.  GI consultation by Dr. Gustavo Cook.   DISCHARGE DIAGNOSES: 1.  Acute renal failure.  2.  End-stage renal disease on hemodialysis now.  3.  Hypertension.  4.  Chronic diarrhea.  5.  Anemia of chronic disease.  6.  History of deep vein thrombosis.  7.  Hypokalemia.  8.  Hyperlipidemia. 9.  Erosive esophagitis.  DISCHARGE HOME MEDICATIONS:  1.  Zoloft 100 mg p.o. daily.  2.  Warfarin 5 mg p.o. every day.  3.  Warfarin 2.5 mg on Wednesdays and Sundays, along with the 5 mg tablets.  4.  Ambien 10 mg p.o. at bedtime.  5.  Metoprolol 100 mg p.o. b.i.d.  6.  Tramadol 50 mg p.o. q.6 hours p.r.n. for pain.  7   Probiotic capsule p.o. b.i.d.  8.  Calcium acetate 67 mg tablets 3 times a day with meals.  9.  Imodium 2 mg capsules 4 times a day as needed for diarrhea.  10.  Protonix 40 mg p.o. b.i.d.   DISCHARGE DIET:  Renal.   DISCHARGE ACTIVITY: As tolerated.   FOLLOWUP INSTRUCTIONS:  1.  For dialysis tomorrow as scheduled.  2.  INR check at dialysis tomorrow and also on 05/10/2013. Goal INR is between 2 to 3 and adjust Coumadin dose as needed.  3.  PCP followup next week.  4.  GI followup in 3 to 4 weeks; will need repeat endoscopy for her erosive esophagitis.  LABORATORY DATA AND IMAGING STUDIES PRIOR TO DISCHARGE:  Sodium 141, potassium 3.1, chloride 103, bicarb 33, BUN 13, creatinine 4.78.  Calcium 8.5 and glucose of 88. INR is 1.2. Phosphorus 4.8.   WBC is 8.1, hemoglobin 8.4,  hematocrit 25.4. Platelet count is 192.   Chest x-ray revealing pulmonary vascular congestion, enlargement of cardiac silhouette and bronchitic changes without acute infiltrate. Ultrasound Doppler bilateral lower extremities showing no evidence of any DVT in both lower extremities.   Hepatitis B surface antigen is negative. Iron profile showing serum iron of 55 and iron saturation of 41%, ferritin 281.   PTH intact is elevated at 837. Stool cultures are negative. Stool for C. diff is negative.   LDL cholesterol 258, HDL 32, total cholesterol 335, triglycerides 225. Pancreatic elastase is normal in the feces.    Upper GI endoscopy, done on 05/03/2013, showing erosive gastritis, esophageal varices and erosive duodenitis.   Colonoscopy, done on 05/03/2013: Two polyps at the hepatic flexure and 1 polyp in transverse colon, which were resected and sent for biopsies.   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  1.  Acute renal  failure on admission on top of chronic kidney disease, stage IV, progressed to chronic kidney disease  stage V, progressed to end-stage renal disease while in the hospital. The patient was seen by nephrology. In spite of fluids, no improvement in creatinine, and is seen  on chest x-ray with pulmonary vascular congestion. The patient was started on hemodialysis after PermCath was placed. She has had more than 3 sessions of dialysis in the hospital and has been tolerating it okay. She will be set up with outpatient dialysis center with Monday, Wednesday, Friday schedule.  Her last dialysis is prior to discharge on 05/06/2013, and the next dialysis as outpatient will be on 05/07/2013.   She is being followed by nephrology while in the hospital.  2.  Subacute diarrhea going on for greater than 3 months. EGD, colonoscopy done; shows only NSAID-induced erosive esophagitis. Colonoscopy, except polyps, everything was normal. Stool studies were negative. Seen by GI in the hospital. Placed on probiotics. Diarrhea has improved at this time. She is recommended to continue her probiotics at this time. For the erosive esophagitis, she was advised to stop the NSAIDs. She will be discharged on Protonix 40 mg b.i.d.  3. History of deep vein thrombosis on Coumadin. Coumadin was stopped due to procedures in the hospital and restarted back. She was not bridged because her lower extremity Dopplers did not have any DVT, and her anemia, no bridging has been used.  4.  Anemia of chronic disease. Low hemoglobin, no indication for transfusion while in the hospital because it stayed more than 7; however, outpatient followup is recommended. In the future, if she continues to drop her hemoglobin, IVC filters can be considered with her history of DVT.  She will be getting Epogen during dialysis.   Her course has been otherwise uneventful in the hospital.   DISCHARGE CONDITION:  Stable.   DISCHARGE DISPOSITION:  Home.   TIME SPENT ON DISCHARGE:  45 minutes.    ____________________________ Gladstone Lighter, MD rk:dmm D: 05/06/2013 14:04:00 ET T: 05/06/2013 21:20:41 ET JOB#: [id number]  cc: Gladstone Lighter, MD, <Dictator> Teresa A. Teresa Deutscher, MD Teresa K. Mottl, MD Teresa Sails, MD Gladstone Lighter MD ELECTRONICALLY SIGNED 05/26/2013 14:01

## 2014-04-30 NOTE — Op Note (Signed)
PATIENT NAME:  Teresa Cook, Teresa Cook MR#:  [id number] DATE OF BIRTH:  [birth year]  DATE OF PROCEDURE:  04/30/2013  PREOPERATIVE DIAGNOSIS:  Chronic renal insufficiency now requiring hemodialysis.   POSTOPERATIVE DIAGNOSIS:  Chronic renal insufficiency now requiring hemodialysis.  PROCEDURE PERFORMED:  Insertion of right IJ tunneled catheter.   SURGEON: Hortencia Pilar, M.D.  SEDATION:  Versed plus fentanyl intravenous. Continuous ECG, pulse oximetry and cardiopulmonary monitoring is performed throughout the entire procedure by the interventional radiology nurse. Total sedation time was 45 minutes.   ACCESS:  Right IJ.   FLUOROSCOPY TIME:  Approximately 1 minute.   CONTRAST USED:  None.   INDICATIONS:  Ms. [name] is a 59 year old woman who has now progressed in her renal disease to end-stage and requires hemodialysis. Risks and benefits were reviewed. All questions answered. The patient has agreed to proceed.   DESCRIPTION OF PROCEDURE:  The patient is taken to special procedures and placed in the supine position. After adequate sedation is achieved, the right neck and chest wall are prepped and draped in a sterile fashion. Ultrasound is placed in a sterile sleeve. Jugular vein is identified. It is echolucent and compressible indicating patency. Image is recorded for the permanent record. Under real-time visualization, Seldinger needle is inserted into the jugular vein.  J-wire is advanced. With fluoroscopic guidance, the wire is advanced into the inferior vena cava. Counterincision is made at the wire insertion site with an 11 blade scalpel. A small pocket is fashioned with blunt dissection, and the dilator is passed over the wire.   A 19 cm tip-to-cuff Cannon catheter is then advanced through the peel-away sheath, which is inserted over the wire. Peel-away sheath is removed. Under fluoroscopy, the catheter is positioned with its tip at the atriocaval junction and an exit site on the  chest wall is selected. Lidocaine 1% is infiltrated. A small incision is created. Tunneling device is passed subcutaneously and the catheter is pulled through the tunnel. It is then transected and the hub assembly is connected without difficulty. Both lumens aspirate and flush easily. Each lumen is then packed with 5000 units of heparin per 2 mL; 0 silk is used to secure the catheter to the chest wall. The neck counterincision is closed with Monocryl. The patient tolerated the procedure well and there were no immediate complications. Sterile dressing was applied. She is taken to the recovery area in excellent condition.    ____________________________ Katha Cabal, MD ggs:dmm D: 05/20/2013 10:25:00 ET T: 05/20/2013 10:33:19 ET JOB#: [id number]  cc: Katha Cabal, MD, <Dictator> Katha Cabal MD ELECTRONICALLY SIGNED 06/08/2013 12:25

## 2014-05-02 LAB — SURGICAL PATHOLOGY

## 2014-05-04 NOTE — Discharge Summary (Signed)
PATIENT NAME:  Teresa Cook, Teresa Cook MR#:  [id number] DATE OF BIRTH:  02-Jun-1955  DATE OF ADMISSION:  12/26/2013 DATE OF DISCHARGE:     No dictation.  ____________________________ Lafonda Mosses Posey Pronto, MD shp:at D: 12/29/2013 14:54:52 ET T: 12/29/2013 15:12:50 ET JOB#: [id number]  cc: Zayon Trulson H. Posey Pronto, MD, <Dictator> Alric Seton MD ELECTRONICALLY SIGNED 01/09/2014 12:15

## 2014-05-04 NOTE — Discharge Summary (Signed)
PATIENT NAME:  Teresa Cook, GIORGI MR#:  [id number] DATE OF BIRTH:  February 18, 1955  DATE OF ADMISSION:  12/26/2013 DATE OF DISCHARGE:  12/29/2013  ADMITTING DIAGNOSIS: Shortness of breath.   DISCHARGE DIAGNOSES:  1.  Acute respiratory failure with hypoxia secondary to pulmonary edema as a result of acute on chronic systolic congestive heart failure as a result of dietary indiscretion.  2.  End-stage renal disease, status post hemodialysis.  3.  Elevated troponin, felt to be demand ischemia, poor renal clearance. No further cardiac workup per cardiology.  4.  Hyperglycemia with hemoglobin A1c being 5.0, likely reactive in nature.  5.  Hypertension.  6.  History of pulmonary embolism/deep venous thrombosis.  7.  Gastroesophageal reflux disease with esophagitis.  8.  Restless leg syndrome.   CONSULTANTS: Munsoor Lilian Kapur, MD, Isaias Cowman, MD   PERTINENT LABORATORY AND EVALUATIONS: Admitting glucose 269, BUN 56, creatinine 8.49, sodium 140, potassium 4.6, chloride 104, CO2 of 21, calcium 7.8. LFTs were normal, except albumin of 3.3, alkaline phosphatase is 277. Troponin 0.02, 0.08 and 0.014. WBC 17.0, hemoglobin 12.3, platelet count was 327,000. WBC count on discharge is 7.1. INR 2.3. ABG: PH of 7.24, pCO2 of 44, pO2 of 58. Chest x-ray showed bibasilar airspace opacities, likely underlying vascular congestion.   HOSPITAL COURSE: Please refer to H and P done by the admitting physician. The patient is a 59 year old African American female with end-stage renal disease, presented with shortness of breath who had dietary indiscretion, who came to the ED with shortness of breath, acute respiratory failure. She was noted to have fluid overload, acute systolic CHF. The patient was admitted, started on hemodialysis. After hemodialysis treatments, her respiratory status is much improved. Her breathing is back to baseline. We strongly encourage her to follow her sodium intake. The patient also had an  elevated troponin and was seen by cardiology. They recommended medical management. At this time, she is doing much better. Respiratory status back to baseline.   DISPOSITION: She is being discharged home.   DISCHARGE INSTRUCTIONS: For CHF given.   DISCHARGE MEDICATIONS: Metoprolol tartrate 100 mg 1 tablet p.o. b.i.d., losartan 50 daily, Protonix 40 one tablet p.o. b.i.d. Warfarin 7.5 mg 1 tablet 4 times a week on Sunday, Wednesday, Friday, Saturday. Warfarin 5 mg on Monday, Tuesday, Thursday. Senna spore 60 daily, Renvela 800 mg 1 tablet p.o. t.i.d., Ambien 10 at bedtime p.r.n., Zoloft 100 two tablets daily, Lasix 40 daily, aspirin 81 one tablet p.o. daily, isosorbide mononitrate 30 daily, acetaminophen/oxycodone 325/5 q. 8 p.r.n. for pain.   DIET: Renal diet.   ACTIVITY: As tolerated.   FOLLOWUP: With primary M.D. in 1-2 weeks. Continue hemodialysis as previously.   TIME SPENT ON THIS PATIENT: Thirty-five minutes.    ____________________________ Lafonda Mosses Posey Pronto, MD shp:TT D: 12/29/2013 14:59:34 ET T: 12/29/2013 15:31:42 ET JOB#: [id number]  cc: Elene Downum H. Posey Pronto, MD, <Dictator> Alric Seton MD ELECTRONICALLY SIGNED 01/09/2014 12:16

## 2014-05-08 NOTE — Discharge Summary (Signed)
PATIENT NAME:  Teresa Cook, WIRKKALA MR#:  [id number] DATE OF BIRTH:  [birth year]  DATE OF ADMISSION:  01/15/2014 DATE OF DISCHARGE:  01/18/2014  DISCHARGING PHYSICIAN: Gladstone Lighter, MD.  PRIMARY CARE PHYSICIAN: Ngwe A. Clide Deutscher, [place]: Nephrology consultation by Tama High, MD.   DISCHARGE DIAGNOSES:  1. Acute respiratory failure requiring BiPAP.  2. Acute pulmonary edema.  3. Acute on chronic systolic congestive heart failure exacerbation.  4. End-stage renal disease on Monday, Wednesday, Friday hemodialysis.  5. Hypertension.  6. History of deep venous thrombosis on chronic anticoagulation.  7. Gastroesophageal reflex disease.  8. Restless leg syndrome.  9. Chronic anemia.  10. Secondary hyperparathyroidism.   DISCHARGE HOME MEDICATIONS:  1. Protonix 40 mg p.o. b.i.d.  2. Sensipar 60 mg p.o. daily.  3. Renvela 800 mg p.o. 3 times a day.  4. Lasix 40 mg p.o. daily.  5. Aspirin 81 mg p.o. daily.  6. Zoloft 100 mg 2 tablets daily.  7. Losartan 50 mg p.o. daily.  8. Zolpidem 10 mg p.o. at bedtime as needed.  9. Warfarin 5 mg p.o. daily.  10. Metoprolol 50 mg p.o. b.i.d.  11. Celexa 20 mg p.o. daily.   DISCHARGE DIET: Renal diet.   DISCHARGE ACTIVITY: As tolerated.   HOME OXYGEN: None.   FOLLOWUP INSTRUCTIONS:  1. Follow up for dialysis per schedule for 01/19/2014.  2. PCP followup in 1 week.  3. PT and INR draw at dialysis.   LABORATORIES AND IMAGING STUDIES PRIOR TO DISCHARGE: Sodium 134, potassium 3.8, chloride 95, bicarbonate 30, BUN 34, creatinine 5.1, glucose 90, calcium of 9.3.   WBC 8.1, hemoglobin 13.3, hematocrit 41.6, platelet count 219,000.   Echocardiogram Doppler showing LV ejection fraction is reduced to 35% to 40%, impaired relaxation pattern, concentric LVH, moderate pleural effusion noted.   INR is 2.0.   Chest x-ray showing improvement in pulmonary edema. No focal airspace consolidation. Mild cardiomegaly.    Borderline elevated troponins at 0.08 on admission secondary to demand ischemia.  Hepatitis B surface antigen is negative.   BRIEF HOSPITAL COURSE: [name] is a 59 year old African American female with a past medical history significant for end-stage renal disease on hemodialysis, systolic CHF, EF of [id number] to 40% hypertension, history of DVT on Coumadin who presents from home secondary to worsening shortness of breath.  1.  Acute respiratory failure requiring BiPAP secondary to acute pulmonary edema from CHF exacerbation. Possible dietary noncompliance. Related to that, the patient was dialyzed back-to-back on admission and then put back on her schedule with improved breathing and off oxygen. Repeat chest x-ray shows improvement in her pulmonary edema. She is already on cardiac medications including metoprolol and losartan. She is also on Lasix. Dietary compliance has been educated to patient and outpatient followup recommended.  2.  History of DVT/PE, on Coumadin. INR therapeutic. INR checked at dialysis.  3.  Depression, on Celexa and Zoloft.  4.  Hypertension: Blood pressure was low normal in the hospital so her metoprolol dose decreased to 50 mg b.i.d., and Imdur has been suspended at this time. She will continue her losartan and Lasix.   Her course has been otherwise uneventful in the hospital.   DISCHARGE CONDITION: Stable.   DISCHARGE DISPOSITION: Home.   Time Spent on discharge: 40 minutes.    ____________________________ Gladstone Lighter, MD [id number] D: 01/19/2014 11:38:23 ET T: 01/19/2014 15:26:21 ET JOB#: [id number]  cc: Gladstone Lighter, MD, <Dictator> Ngwe A. Clide Deutscher, MD Tama High, MD [facility]  Tehya Leath MD ELECTRONICALLY SIGNED 01/24/2014 16:19

## 2014-05-08 NOTE — Discharge Summary (Signed)
PATIENT NAME:  [name], [name] MR#:  [id number] DATE OF BIRTH:  01/27/1955  DATE OF ADMISSION:  02/03/2014 DATE OF DISCHARGE:  02/03/2014  PRESENTING COMPLAINT: Palpitations and chest pain.   DISCHARGE DIAGNOSES: 1. Chest pain, appears atypical, resolved. Palpitations, resolved.  2. End-stage renal disease on hemodialysis.  3. History of chronic congestive heart failure, stable. Saturations 100% on room air.   CODE STATUS: Full code.   MEDICATIONS: 1. Protonix 40 mg b.i.d.  2. Sensipar 60 mg daily.  3. Tramadol 50 mg every 8 hours as needed.  4. Losartan 50 mg daily.  5. Zolpidem 10 mg 1 tablet at bedtime daily as needed.  6. Metoprolol 100 mg b.i.d.  7. Renvela 800 mg 1 tablet 3 times a day.  8. Sertraline 100 mg 2 tablets once a day.  9. Coumadin. Resume your Coumadin as before.  10. Ropinirole 1 tablet p.o. daily at bedtime as needed.  11. Acetaminophen/hydrocodone 1 tablet every 6 hours as needed.  12. Aspirin 81 mg daily.  13. Atorvastatin 20 mg at bedtime.   DISCHARGE DIET: Renal diet.   FOLLOWUP APPOINTMENTS: Follow up with Dr. Clide Deutscher at [facility]. Resume your hemodialysis before.   CONSULTATIONS: Nephrology consultation with Dr. Anthonette Legato.   LABORATORY DATA: Troponin is 5.0, .07, .07. PT/INR is 29.5 and 2.9., Potassium is 4.0. CBC within normal limits. LDL is 186, cholesterol is 250.   BRIEF SUMMARY OF HOSPITAL COURSE:  1. Teresa Cook is 59 year old African American female with history of end-stage renal disease on hemodialysis. Comes in with chest pain, palpitations, which occurred at dialysis. She was admitted with chest pain. She remained in sinus rhythm. Aspirin and statin therapy were introduced. Her troponin was 0.05, 0.07, 0.07. She had similar troponins in the past. Cardiac evaluation was done in December 2015. She has known cardiomyopathy with ejection fraction of 35%. No major wall motion abnormality was noted. No known history of  coronary artery disease per her old records. She feels at baseline, and the patient will be discharged to home.  2. End-stage renal disease. Nephrology saw the patient. There was no indication for dialysis. Her potassium was stable.  3. History of pulmonary embolus and deep vein thrombosis on Coumadin with therapeutic INR. 4. Gastroesophageal reflux disease on proton pump inhibitor.  CONDITION: Overall, hospital stay remained stable.   CODE STATUS: The patient remained a full code.   TIME SPENT: 40 minutes.    ____________________________ Hart Rochester Posey Pronto, MD sap:mw D: 02/04/2014 07:16:54 ET T: 02/04/2014 11:22:19 ET JOB#: [id number]  cc: Jeremaih Klima A. Posey Pronto, MD, <Dictator> Munsoor Lilian Kapur, MD Ngwe A. Clide Deutscher, MD Ilda Basset MD ELECTRONICALLY SIGNED 02/08/2014 11:57

## 2014-05-08 NOTE — H&P (Signed)
PATIENT NAME:  Teresa Cook, Teresa Cook MR#:  [id number] DATE OF BIRTH:  1955/01/20  DATE OF ADMISSION:  02/02/2014  REFERRING PHYSICIAN:  Brunilda Payor A. Edd Fabian, MD   PRIMARY CARE PHYSICIAN:  Ngwe A. Clide Deutscher, MD, at [facility].   CHIEF COMPLAINT:  Chest pain.   HISTORY OF PRESENT ILLNESS:  A 59 year old African-American female with a history of end-stage renal disease on hemodialysis secondary to complications of hypertension as well as history of systolic congestive heart failure, EF of 40%, presenting with chest pain when she was at dialysis today. She had an episode of palpitations followed by retrosternal chest pain described as aching in quality, nonradiating, no worsening or relieving factors. They actually were unable to complete the dialysis due to her symptoms, so she was sent to the hospital for the above. She has been complaining of generalized fatigue and weakness, particularly post dialysis for the last 3 to 4 sessions. No further symptoms at this time. The chest pain has resolved.   REVIEW OF SYSTEMS: CONSTITUTIONAL:  Denies fevers or chills. Positive for fatigue and weakness.  EYES:  Denies blurred vision, double vision, or eye pain.  EARS, NOSE, AND THROAT:  Denies tinnitus, ear pain, or hearing loss.  RESPIRATORY:  Denies cough, wheeze, or shortness of breath.  CARDIOVASCULAR:  Positive for chest pain and palpitations as described above.  GASTROINTESTINAL:  Denies nausea, vomiting, diarrhea, or abdominal pain.  GENITOURINARY:  Denies dysuria or hematuria.  ENDOCRINE:  Denies nocturia or thyroid problems.  HEMATOLOGIC AND LYMPHATIC:  Denies easy bruising or bleeding.  SKIN:  Denies rash or lesions.  MUSCULOSKELETAL:  Denies pain in the neck, back, shoulders, knees, or hips or arthritic symptoms.  NEUROLOGIC:  Denies paralysis or paresthesias.  PSYCHIATRIC:  Denies anxiety or depressive symptoms.  Otherwise, full review of systems performed by me is negative.   PAST MEDICAL  HISTORY:  End-stage renal disease on hemodialysis secondary to complications of hypertension; essential hypertension; DVT and PE, currently on warfarin for anticoagulation; gastroesophageal reflux disease without esophagitis; restless leg syndrome; systolic congestive heart failure, EF of 40%.   SOCIAL HISTORY:  No alcohol, tobacco, or drug usage.   FAMILY HISTORY:  Denies any known cardiovascular or pulmonary disorders.   ALLERGIES:  No known drug allergies.   HOME MEDICATIONS:  Include acetaminophen and hydrocodone unknown dosage 1 tablet every 6 hours as needed for pain, tramadol 50 mg p.o. q. 8 hours as needed for pain, losartan 50 mg p.o. daily, warfarin 5 mg p.o. daily, sertraline 100 mg p.o. daily, Requip 1 tablet p.o. at bedtime as needed, Ambien 10 mg p.o. at bedtime as needed for sleep, metoprolol 100 mg p.o. b.i.d., Sensipar 60 mg p.o. daily, Renvela (sevelamer carbonate) 800 mg 1 tablet p.o. 3 times daily with meals, and pantoprazole 40 mg p.o. b.i.d.   PHYSICAL EXAMINATION: VITAL SIGNS:  Temperature 98.8, heart rate 110 and currently 77, respirations 18, blood pressure 118/90s, saturating 96% on room air. Weight 58.2 kg, BMI 20.7.  GENERAL:  Weak-appearing African-American female, currently in no acute distress.  HEAD:  Normocephalic, atraumatic.  EYES:  Pupils are equal, round, and reactive to light. Extraocular muscles are intact. No scleral icterus.  MOUTH:  Moist mucous membranes. Dentition is intact. No abscess noted.  EARS, NOSE, AND THROAT:  Clear without exudates. No external lesions.  NECK:  Supple. No thyromegaly. No nodules. No JVD.  PULMONARY:  Clear to auscultation bilaterally without wheezes, rales, or rhonchi. No use of accessory muscles. Good respiratory  effort. CHEST:  Nontender to palpation.  CARDIOVASCULAR:  S1 and S2, regular rate and rhythm. No murmurs, rubs, or gallops. No edema. Pedal pulses 2+ bilaterally.  GASTROINTESTINAL:  Soft, nontender, nondistended. No  masses. Positive bowel sounds. No hepatosplenomegaly.  MUSCULOSKELETAL:  No swelling, clubbing, or edema. Range of motion is full in all extremities.  NEUROLOGIC:  Cranial nerves II through XII are intact. No gross focal neurological deficits. Sensation is intact. Reflexes are intact.  SKIN:  No ulcerations, lesions, rashes, or cyanosis. Skin is warm and dry. Turgor is intact.  PSYCHIATRIC:  Mood and affect are within normal limits. The patient is alert and oriented x 3. Insight and judgment are intact.   LABORATORY DATA:  EKG performed reveals normal sinus rhythm with LVH. No ST or T wave abnormalities. Sodium is 140, potassium 3, chloride 101, bicarbonate 29, BUN 19, creatinine 4.46, glucose 109, alkaline phosphatase 185, otherwise LFTs within normal limits. Troponin is 0.05. WBC is 8.6, hemoglobin 12.6, platelets 277,000. INR is 2.9. Chest x-ray performed reveals no acute cardiopulmonary process. CT of the head performed, which reveals no acute intracranial process.   ASSESSMENT AND PLAN:  A 59 year old African-American female with a history end-stage renal disease on hemodialysis, presenting with chest pain, which occurred during dialysis.   1.  Chest pain. We will place on telemetry. Initiate aspirin and statin therapy. Trend cardiac enzymes x 3. If troponins do actually trend upwards, would hold off on anticoagulation, as she is currently therapeutic with warfarin.  2.  End-stage renal disease on hemodialysis. We will consult nephrology, as she did not complete dialysis today and she may require repeat hemodialysis tomorrow. No acute indication for dialysis at this time.  3.  Pulmonary embolus and deep vein thrombosis. Currently on warfarin, which is therapeutic.  4.  Systolic congestive heart failure, not in any active symptoms. Continue with aspirin and statin therapy, as well as losartan and metoprolol.  5.  Gastroesophageal reflux disease without esophagitis. Proton pump inhibitor  therapy. 6.  Venous thromboembolism prophylaxis with therapeutic warfarin.   CODE STATUS:  The patient is a full code.   TIME SPENT:  45 minutes.     ____________________________ Aaron Mose. Hower, MD dkh:nb D: 02/02/2014 23:23:58 ET T: 02/03/2014 00:14:49 ET JOB#: [id number]  cc: Aaron Mose. Hower, MD, <Dictator> DAVID Woodfin Ganja MD ELECTRONICALLY SIGNED 02/03/2014 2:24

## 2014-05-08 NOTE — H&P (Signed)
PATIENT NAME:  Teresa Cook, Teresa Cook MR#:  [id number] DATE OF BIRTH:  07/09/1955  DATE OF ADMISSION:  01/15/2014  PRIMARY CARE PHYSICIAN: Ngwe A. Clide Deutscher, MD  REFERRING PHYSICIAN: Dr. Jimmye Norman.  CHIEF COMPLAINT: Severe shortness of breath.   HISTORY OF PRESENT ILLNESS: [name] is a 59 year old female with a history of end-stage renal disease on hemodialysis Monday, Wednesday, Friday; hypertension; was doing well in her usual state of health until this evening, when the patient started to develop severe  shortness of breath. The patient has been compliant with her dialysis, last dialysis was on Friday. The patient had a birthday party at home, where patient ate a significant amount of salt. Came to the Emergency Department with severe respiratory distress with accelerated hypertension, systolic greater than [id number]. The patient was placed on BiPAP was given labetalol with improvement of the blood pressure to 150s; however, the patient continues to have shortness of breath. The patient denied having any cough, fever.   PAST MEDICAL HISTORY:  1.  End-stage renal disease on hemodialysis Monday, Wednesday, and Friday.  2.  Hypertension.  3.  History of pulmonary embolism on chronic anticoagulation.  4.  Gastroesophageal reflux disease. 5.  Restless leg syndrome.   PAST SURGICAL HISTORY: Dialysis access from right wall tunneled catheter.   ALLERGIES: No known drug allergies.   HOME MEDICATIONS:  1.  Ambien 10 mg at nighttime  2.  Zoloft 100 mg daily.  3.  Coumadin 7.5 mg 6 days a week.  4.  5 mg once a day.  5.  Sensipar 60 mg once a day.  6.  Renvela 800 mg 3 times a day.  7.  Protonix 40 mg 2 times a day.  8.  Metoprolol 100 mg 2 times a day.  9.  Losartan 50 mg once a day.  10.  Imdur 50 mg once a day.  11.  Lasix 40 mg once a day.  12.  Aspirin 81 mg once a day.   SOCIAL HISTORY: No history of smoking, drinking alcohol, or using illicit drugs. Lives with her family.   FAMILY  HISTORY: No known history of coronary artery disease.   REVIEW OF SYSTEMS:  CONSTITUTIONAL: Experiencing generalized weakness.  EYES: No change in vision.  EAR, NOSE, THROAT: No change in hearing. RESPIRATORY: Has shortness of breath.  CARDIOVASCULAR: No chest pain, palpations.  GASTROINTESTINAL: No nausea, vomiting, abdominal pain.  GENITOURINARY: No dysuria or hematuria. HEMATOLOGIC: No easy bruising or bleeding.  SKIN: No rash or lesions. MUSCULOSKELETAL: No joint pains and aches.  NEUROLOGIC: No weakness or numbness in any part of the body.   PHYSICAL EXAMINATION:  GENERAL: This thin built female lying down in the bed, not in distress.  VITAL SIGNS: Temperature 97.2, pulse 101, blood pressure 200/139, respiratory rate of 35, oxygen saturation is 95% on BiPAP.  HEENT: Head normocephalic, atraumatic. There is no scleral icterus. Conjunctivae normal. Pupils equal and reactive. Mucous membranes moist. No pharyngeal erythema. NECK: Supple. No lymphadenopathy. No JVD. No carotid bruit. No thyromegaly.  CHEST: Has no focal tenderness. Bilateral diffuse crackles up to the mid chest.  HEART: S1, S2 regular. No murmurs are heard.  ABDOMEN: Bowel sounds present. Soft, nontender, nondistended. No hepatosplenomegaly.  EXTREMITIES: No pedal edema. Pulses 2+.  NEUROLOGIC: The patient is alert, oriented to place, person, and time. Cranial nerves II-XII intact. Motor 5/5 in upper and lower extremities.   LABORATORY AND RADIOLOGIC DATA:  1.  Chest x-ray, one view, portable: Increased cardiomegaly with interstitial  and airspace opacities consistent with pulmonary edema.  2.  Coagulation profile: INR is 2.2.  3.  Troponin 0.02.  4.  BMP: BUN 828, creatinine of 6.75, the rest of the values are within normal limits.  5.  CBC: WBC of 12.9, hemoglobin 15, platelet count of 321,000.  6.  ABG: PH of 7.32,  pCO2 of 43, pO2 of 79 on 100% BiPAP.  ASSESSMENT AND PLAN: [name] is a 59 year old female who  comes to the Emergency Department with pulmonary edema.  1.  Pulmonary edema from fluid overload secondary to noncompliance with salt intake: Emergency Department physician discussed with Dr. Juleen China who will see the patient to get dialysis. The patient currently having good oxygen saturations on the current settings of the BiPAP. Consult nephrology.  2.  Hypertension, accelerated. The patient states has been compliant with her medications. Start back on home medications. Keep the patient on labetalol 20 mg every 6 hours as needed.  3.  History of deep vein thrombosis/pulmonary embolism. Continue the Coumadin INR is therapeutic. 4.  Hypoxic respiratory failure: Continue with BiPAP. Admit the patient to the stepdown unit.  5.  Keep the patient on deep vein thrombosis prophylaxis with heparin.   TIME SPENT: 50 minutes.    ____________________________ Monica Becton, MD pv:bm D: 01/15/2014 23:08:42 ET T: 01/15/2014 23:40:44 ET JOB#: [id number]  cc: Monica Becton, MD, <Dictator> Monica Becton MD ELECTRONICALLY SIGNED 01/17/2014 21:24

## 2014-05-16 DIAGNOSIS — I5022 Chronic systolic (congestive) heart failure: Secondary | ICD-10-CM

## 2014-05-16 DIAGNOSIS — I10 Essential (primary) hypertension: Secondary | ICD-10-CM

## 2014-05-16 DIAGNOSIS — I5033 Acute on chronic diastolic (congestive) heart failure: Secondary | ICD-10-CM | POA: Insufficient documentation

## 2014-05-16 DIAGNOSIS — I5023 Acute on chronic systolic (congestive) heart failure: Secondary | ICD-10-CM | POA: Insufficient documentation

## 2014-05-16 DIAGNOSIS — I5043 Acute on chronic combined systolic (congestive) and diastolic (congestive) heart failure: Secondary | ICD-10-CM | POA: Insufficient documentation

## 2014-05-16 DIAGNOSIS — N19 Unspecified kidney failure: Secondary | ICD-10-CM

## 2014-05-16 DIAGNOSIS — F419 Anxiety disorder, unspecified: Secondary | ICD-10-CM | POA: Insufficient documentation

## 2014-05-27 ENCOUNTER — Other Ambulatory Visit: Payer: Self-pay

## 2014-05-27 ENCOUNTER — Encounter: Payer: Self-pay | Admitting: Emergency Medicine

## 2014-05-27 ENCOUNTER — Emergency Department
Admission: EM | Admit: 2014-05-27 | Discharge: 2014-05-27 | Disposition: A | Discharge status: Home / Self Care | Payer: Medicare Other | Attending: Emergency Medicine | Admitting: Emergency Medicine

## 2014-05-27 DIAGNOSIS — R251 Tremor, unspecified: Secondary | ICD-10-CM | POA: Insufficient documentation

## 2014-05-27 LAB — CBC
HEMATOCRIT: 31.5 % — AB (ref 35.0–47.0)
HEMOGLOBIN: 10.3 g/dL — AB (ref 12.0–16.0)
MCH: 28.2 pg (ref 26.0–34.0)
MCHC: 32.7 g/dL (ref 32.0–36.0)
MCV: 86.4 fL (ref 80.0–100.0)
Platelets: 168 10*3/uL (ref 150–440)
RBC: 3.65 MIL/uL — AB (ref 3.80–5.20)
RDW: 16.1 % — AB (ref 11.5–14.5)
WBC: 7.5 10*3/uL (ref 3.6–11.0)

## 2014-05-27 LAB — COMPREHENSIVE METABOLIC PANEL
ALBUMIN: 3.5 g/dL (ref 3.5–5.0)
ALT: 13 U/L — ABNORMAL LOW (ref 14–54)
ANION GAP: 15 (ref 5–15)
AST: 15 U/L (ref 15–41)
Alkaline Phosphatase: 197 U/L — ABNORMAL HIGH (ref 38–126)
BILIRUBIN TOTAL: 0.3 mg/dL (ref 0.3–1.2)
BUN: 74 mg/dL — AB (ref 6–20)
CALCIUM: 7.4 mg/dL — AB (ref 8.9–10.3)
CO2: 22 mmol/L (ref 22–32)
CREATININE: 8.62 mg/dL — AB (ref 0.44–1.00)
Chloride: 102 mmol/L (ref 101–111)
GFR calc Af Amer: 5 mL/min — ABNORMAL LOW (ref >60)
GFR calc non Af Amer: 4 mL/min — ABNORMAL LOW (ref >60)
GLUCOSE: 95 mg/dL (ref 65–99)
POTASSIUM: 4.3 mmol/L (ref 3.5–5.1)
Sodium: 139 mmol/L (ref 135–145)
Total Protein: 6.8 g/dL (ref 6.5–8.1)

## 2014-05-27 LAB — TROPONIN I: Troponin I: <0.03 ng/mL

## 2014-05-27 NOTE — ED Notes (Signed)
Patient states she woke up feeling shaky. Patient with tremors in triage room. States she feels mildly weak. Patient is a dialysis patient.

## 2014-08-29 ENCOUNTER — Inpatient Hospital Stay
Admission: EM | Admit: 2014-08-29 | Discharge: 2014-08-30 | DRG: 291 | Disposition: A | Discharge status: Home / Self Care | Payer: Medicare Other | Attending: Internal Medicine | Admitting: Internal Medicine

## 2014-08-29 ENCOUNTER — Encounter: Payer: Self-pay | Admitting: *Deleted

## 2014-08-29 ENCOUNTER — Inpatient Hospital Stay (HOSPITAL_COMMUNITY)
Admit: 2014-08-29 | Discharge: 2014-08-29 | Disposition: A | Discharge status: Home / Self Care | Payer: Medicare Other | Attending: Internal Medicine | Admitting: Internal Medicine

## 2014-08-29 ENCOUNTER — Emergency Department: Payer: Medicare Other

## 2014-08-29 DIAGNOSIS — I120 Hypertensive chronic kidney disease with stage 5 chronic kidney disease or end stage renal disease: Secondary | ICD-10-CM | POA: Diagnosis present

## 2014-08-29 DIAGNOSIS — J810 Acute pulmonary edema: Secondary | ICD-10-CM | POA: Diagnosis not present

## 2014-08-29 DIAGNOSIS — F329 Major depressive disorder, single episode, unspecified: Secondary | ICD-10-CM | POA: Diagnosis present

## 2014-08-29 DIAGNOSIS — Z7901 Long term (current) use of anticoagulants: Secondary | ICD-10-CM | POA: Diagnosis not present

## 2014-08-29 DIAGNOSIS — I953 Hypotension of hemodialysis: Secondary | ICD-10-CM | POA: Diagnosis present

## 2014-08-29 DIAGNOSIS — I429 Cardiomyopathy, unspecified: Secondary | ICD-10-CM | POA: Diagnosis present

## 2014-08-29 DIAGNOSIS — Z79899 Other long term (current) drug therapy: Secondary | ICD-10-CM

## 2014-08-29 DIAGNOSIS — N186 End stage renal disease: Secondary | ICD-10-CM | POA: Diagnosis present

## 2014-08-29 DIAGNOSIS — F419 Anxiety disorder, unspecified: Secondary | ICD-10-CM | POA: Diagnosis present

## 2014-08-29 DIAGNOSIS — K219 Gastro-esophageal reflux disease without esophagitis: Secondary | ICD-10-CM | POA: Diagnosis present

## 2014-08-29 DIAGNOSIS — J9601 Acute respiratory failure with hypoxia: Secondary | ICD-10-CM

## 2014-08-29 DIAGNOSIS — Z992 Dependence on renal dialysis: Secondary | ICD-10-CM

## 2014-08-29 DIAGNOSIS — R739 Hyperglycemia, unspecified: Secondary | ICD-10-CM | POA: Diagnosis present

## 2014-08-29 DIAGNOSIS — N2581 Secondary hyperparathyroidism of renal origin: Secondary | ICD-10-CM | POA: Diagnosis present

## 2014-08-29 DIAGNOSIS — M109 Gout, unspecified: Secondary | ICD-10-CM | POA: Diagnosis present

## 2014-08-29 DIAGNOSIS — I5023 Acute on chronic systolic (congestive) heart failure: Secondary | ICD-10-CM | POA: Diagnosis present

## 2014-08-29 DIAGNOSIS — D631 Anemia in chronic kidney disease: Secondary | ICD-10-CM | POA: Diagnosis present

## 2014-08-29 DIAGNOSIS — I509 Heart failure, unspecified: Secondary | ICD-10-CM

## 2014-08-29 DIAGNOSIS — L409 Psoriasis, unspecified: Secondary | ICD-10-CM | POA: Diagnosis present

## 2014-08-29 DIAGNOSIS — Z86711 Personal history of pulmonary embolism: Secondary | ICD-10-CM | POA: Diagnosis not present

## 2014-08-29 DIAGNOSIS — Z86718 Personal history of other venous thrombosis and embolism: Secondary | ICD-10-CM

## 2014-08-29 DIAGNOSIS — E8779 Other fluid overload: Secondary | ICD-10-CM

## 2014-08-29 DIAGNOSIS — Z79891 Long term (current) use of opiate analgesic: Secondary | ICD-10-CM

## 2014-08-29 DIAGNOSIS — J9621 Acute and chronic respiratory failure with hypoxia: Secondary | ICD-10-CM | POA: Diagnosis present

## 2014-08-29 DIAGNOSIS — R0603 Acute respiratory distress: Secondary | ICD-10-CM | POA: Diagnosis present

## 2014-08-29 DIAGNOSIS — J9690 Respiratory failure, unspecified, unspecified whether with hypoxia or hypercapnia: Secondary | ICD-10-CM | POA: Diagnosis present

## 2014-08-29 LAB — LIPASE, BLOOD: LIPASE: 27 U/L (ref 22–51)

## 2014-08-29 LAB — LACTIC ACID, PLASMA
Lactic Acid, Venous: 3.1 mmol/L (ref 0.5–2.0)
Lactic Acid, Venous: 1.2 mmol/L (ref 0.5–2.0)

## 2014-08-29 LAB — CBC WITH DIFFERENTIAL/PLATELET
BASOS ABS: 0.1 10*3/uL (ref 0–0.1)
BASOS PCT: 1 %
Eosinophils Absolute: 0.4 10*3/uL (ref 0–0.7)
Eosinophils Relative: 2 %
HEMATOCRIT: 44.9 % (ref 35.0–47.0)
HEMOGLOBIN: 14 g/dL (ref 12.0–16.0)
LYMPHS PCT: 25 %
Lymphs Abs: 4.4 10*3/uL — ABNORMAL HIGH (ref 1.0–3.6)
MCH: 27.7 pg (ref 26.0–34.0)
MCHC: 31.3 g/dL — ABNORMAL LOW (ref 32.0–36.0)
MCV: 88.5 fL (ref 80.0–100.0)
Monocytes Absolute: 0.8 10*3/uL (ref 0.2–0.9)
Monocytes Relative: 4 %
NEUTROS ABS: 12.2 10*3/uL — AB (ref 1.4–6.5)
NEUTROS PCT: 68 %
Platelets: 286 10*3/uL (ref 150–440)
RBC: 5.08 MIL/uL (ref 3.80–5.20)
RDW: 16.6 % — ABNORMAL HIGH (ref 11.5–14.5)
WBC: 17.9 10*3/uL — AB (ref 3.6–11.0)

## 2014-08-29 LAB — URINALYSIS COMPLETE WITH MICROSCOPIC (ARMC ONLY)
BACTERIA UA: NONE SEEN
Bilirubin Urine: NEGATIVE
Glucose, UA: 150 mg/dL — AB
Ketones, ur: NEGATIVE mg/dL
LEUKOCYTES UA: NEGATIVE
Nitrite: NEGATIVE
PH: 7 (ref 5.0–8.0)
Protein, ur: 100 mg/dL — AB
Specific Gravity, Urine: 1.009 (ref 1.005–1.030)

## 2014-08-29 LAB — GLUCOSE, CAPILLARY
GLUCOSE-CAPILLARY: 75 mg/dL (ref 65–99)
Glucose-Capillary: 98 mg/dL (ref 65–99)
Glucose-Capillary: 99 mg/dL (ref 65–99)

## 2014-08-29 LAB — COMPREHENSIVE METABOLIC PANEL
ALBUMIN: 3.8 g/dL (ref 3.5–5.0)
ALK PHOS: 407 U/L — AB (ref 38–126)
ALT: 19 U/L (ref 14–54)
AST: 25 U/L (ref 15–41)
Anion gap: 17 — ABNORMAL HIGH (ref 5–15)
BUN: 79 mg/dL — AB (ref 6–20)
CALCIUM: 8.2 mg/dL — AB (ref 8.9–10.3)
CO2: 19 mmol/L — ABNORMAL LOW (ref 22–32)
Chloride: 103 mmol/L (ref 101–111)
Creatinine, Ser: 9.54 mg/dL — ABNORMAL HIGH (ref 0.44–1.00)
GFR calc Af Amer: 5 mL/min — ABNORMAL LOW (ref >60)
GFR, EST NON AFRICAN AMERICAN: 4 mL/min — AB (ref >60)
GLUCOSE: 210 mg/dL — AB (ref 65–99)
POTASSIUM: 5 mmol/L (ref 3.5–5.1)
Sodium: 139 mmol/L (ref 135–145)
Total Bilirubin: 0.4 mg/dL (ref 0.3–1.2)
Total Protein: 8.2 g/dL — ABNORMAL HIGH (ref 6.5–8.1)

## 2014-08-29 LAB — HEMOGLOBIN A1C: Hgb A1c MFr Bld: 5.1 % (ref 4.0–6.0)

## 2014-08-29 LAB — TROPONIN I: Troponin I: <0.03 ng/mL (ref <0.031)

## 2014-08-29 LAB — MRSA PCR SCREENING: MRSA BY PCR: NEGATIVE

## 2014-08-29 LAB — PHOSPHORUS: PHOSPHORUS: 4.5 mg/dL (ref 2.5–4.6)

## 2014-08-29 LAB — PROTIME-INR
INR: 2.03
PROTHROMBIN TIME: 23.1 s — AB (ref 11.4–15.0)

## 2014-08-29 MED ORDER — INSULIN ASPART 100 UNIT/ML ~~LOC~~ SOLN: 0.0000 [IU] | Freq: Three times a day (TID) | SUBCUTANEOUS | Status: DC

## 2014-08-29 MED ORDER — SODIUM CHLORIDE 0.9 % IV SOLN: 100.0000 mL | INTRAVENOUS | Status: DC | PRN

## 2014-08-29 MED ORDER — LIDOCAINE-PRILOCAINE 2.5-2.5 % EX CREA: 1.0000 "application " | TOPICAL_CREAM | CUTANEOUS | Status: DC | PRN

## 2014-08-29 MED ORDER — ZOLPIDEM TARTRATE 5 MG PO TABS
5.0000 mg | ORAL_TABLET | Freq: Every evening | ORAL | Status: DC | PRN
  Administered 2014-08-29: 5 mg via ORAL
  Filled 2014-08-29: qty 1

## 2014-08-29 MED ORDER — WARFARIN SODIUM 1 MG PO TABS: 5.0000 mg | ORAL_TABLET | ORAL | Status: DC

## 2014-08-29 MED ORDER — SEVELAMER CARBONATE 800 MG PO TABS
800.0000 mg | ORAL_TABLET | Freq: Three times a day (TID) | ORAL | Status: DC
  Administered 2014-08-29 – 2014-08-30 (×3): 800 mg via ORAL
  Filled 2014-08-29 (×3): qty 1

## 2014-08-29 MED ORDER — WARFARIN - PHARMACIST DOSING INPATIENT
Freq: Every day | Status: DC
  Administered 2014-08-29: 18:00:00

## 2014-08-29 MED ORDER — METOPROLOL TARTRATE 50 MG PO TABS
50.0000 mg | ORAL_TABLET | Freq: Two times a day (BID) | ORAL | Status: DC
  Administered 2014-08-29 – 2014-08-30 (×3): 50 mg via ORAL
  Filled 2014-08-29 (×2): qty 1

## 2014-08-29 MED ORDER — SENNOSIDES-DOCUSATE SODIUM 8.6-50 MG PO TABS: 1.0000 | ORAL_TABLET | Freq: Every evening | ORAL | Status: DC | PRN

## 2014-08-29 MED ORDER — PANTOPRAZOLE SODIUM 40 MG PO TBEC
40.0000 mg | DELAYED_RELEASE_TABLET | Freq: Every day | ORAL | Status: DC
  Administered 2014-08-29 – 2014-08-30 (×2): 40 mg via ORAL
  Filled 2014-08-29 (×2): qty 1

## 2014-08-29 MED ORDER — METOPROLOL TARTRATE 50 MG PO TABS
ORAL_TABLET | ORAL | Status: AC
  Administered 2014-08-29: 50 mg via ORAL
  Filled 2014-08-29: qty 1

## 2014-08-29 MED ORDER — ACETAMINOPHEN 650 MG RE SUPP: 650.0000 mg | Freq: Four times a day (QID) | RECTAL | Status: DC | PRN

## 2014-08-29 MED ORDER — CITALOPRAM HYDROBROMIDE 20 MG PO TABS
20.0000 mg | ORAL_TABLET | Freq: Every day | ORAL | Status: DC
  Administered 2014-08-30: 20 mg via ORAL
  Filled 2014-08-29: qty 1

## 2014-08-29 MED ORDER — HEPARIN SODIUM (PORCINE) 1000 UNIT/ML DIALYSIS
1000.0000 [IU] | INTRAMUSCULAR | Status: DC | PRN
  Administered 2014-08-29: 4200 [IU] via INTRAVENOUS_CENTRAL

## 2014-08-29 MED ORDER — NITROGLYCERIN IN D5W 200-5 MCG/ML-% IV SOLN
INTRAVENOUS | Status: AC
Start: 2014-08-29 — End: 2014-08-29
  Filled 2014-08-29: qty 250

## 2014-08-29 MED ORDER — LOSARTAN POTASSIUM 50 MG PO TABS
75.0000 mg | ORAL_TABLET | Freq: Every day | ORAL | Status: DC
  Administered 2014-08-30: 75 mg via ORAL
  Filled 2014-08-29 (×2): qty 1

## 2014-08-29 MED ORDER — ONDANSETRON HCL 4 MG/2ML IJ SOLN: 4.0000 mg | Freq: Four times a day (QID) | INTRAMUSCULAR | Status: DC | PRN

## 2014-08-29 MED ORDER — SERTRALINE HCL 50 MG PO TABS
200.0000 mg | ORAL_TABLET | Freq: Every day | ORAL | Status: DC
  Administered 2014-08-30: 200 mg via ORAL
  Filled 2014-08-29: qty 4

## 2014-08-29 MED ORDER — PENTAFLUOROPROP-TETRAFLUOROETH EX AERO: 1.0000 "application " | INHALATION_SPRAY | CUTANEOUS | Status: DC | PRN

## 2014-08-29 MED ORDER — NEPRO/CARBSTEADY PO LIQD: 237.0000 mL | ORAL | Status: DC | PRN

## 2014-08-29 MED ORDER — CINACALCET HCL 30 MG PO TABS
60.0000 mg | ORAL_TABLET | Freq: Every day | ORAL | Status: DC
  Administered 2014-08-30: 60 mg via ORAL
  Filled 2014-08-29: qty 2

## 2014-08-29 MED ORDER — ACETAMINOPHEN 325 MG PO TABS: 650.0000 mg | ORAL_TABLET | Freq: Four times a day (QID) | ORAL | Status: DC | PRN

## 2014-08-29 MED ORDER — WARFARIN SODIUM 7.5 MG PO TABS
7.5000 mg | ORAL_TABLET | ORAL | Status: DC
  Administered 2014-08-29: 7.5 mg via ORAL
  Filled 2014-08-29: qty 1

## 2014-08-29 MED ORDER — ENALAPRILAT 1.25 MG/ML IV SOLN
2.5000 mg | Freq: Once | INTRAVENOUS | Status: AC
  Administered 2014-08-29: 2.5 mg via INTRAVENOUS

## 2014-08-29 MED ORDER — ALTEPLASE 2 MG IJ SOLR: 2.0000 mg | Freq: Once | INTRAMUSCULAR | Status: DC | PRN

## 2014-08-29 MED ORDER — TRAMADOL HCL 50 MG PO TABS
50.0000 mg | ORAL_TABLET | Freq: Three times a day (TID) | ORAL | Status: DC | PRN
  Administered 2014-08-29: 50 mg via ORAL
  Filled 2014-08-29: qty 1

## 2014-08-29 MED ORDER — NITROGLYCERIN IN D5W 200-5 MCG/ML-% IV SOLN
0.0000 ug/min | Freq: Once | INTRAVENOUS | Status: AC
  Administered 2014-08-29: 100 ug/min via INTRAVENOUS

## 2014-08-29 MED ORDER — HYDROCODONE-ACETAMINOPHEN 5-325 MG PO TABS
1.0000 | ORAL_TABLET | ORAL | Status: DC | PRN
  Administered 2014-08-29: 2 via ORAL

## 2014-08-29 MED ORDER — WARFARIN SODIUM 1 MG PO TABS: 5.0000 mg | ORAL_TABLET | Freq: Every day | ORAL | Status: DC

## 2014-08-29 MED ORDER — FUROSEMIDE 10 MG/ML IJ SOLN
80.0000 mg | Freq: Once | INTRAMUSCULAR | Status: AC
  Administered 2014-08-29: 80 mg via INTRAVENOUS

## 2014-08-29 MED ORDER — HYDROCODONE-ACETAMINOPHEN 5-325 MG PO TABS
ORAL_TABLET | ORAL | Status: AC
  Administered 2014-08-29: 2 via ORAL
  Filled 2014-08-29: qty 2

## 2014-08-29 MED ORDER — HEPARIN SODIUM (PORCINE) 5000 UNIT/ML IJ SOLN: 5000.0000 [IU] | Freq: Three times a day (TID) | INTRAMUSCULAR | Status: DC

## 2014-08-29 MED ORDER — FUROSEMIDE 10 MG/ML IJ SOLN
INTRAMUSCULAR | Status: AC
  Filled 2014-08-29: qty 10

## 2014-08-29 MED ORDER — LIDOCAINE HCL (PF) 1 % IJ SOLN: 5.0000 mL | INTRAMUSCULAR | Status: DC | PRN

## 2014-08-29 MED ORDER — ONDANSETRON HCL 4 MG PO TABS: 4.0000 mg | ORAL_TABLET | Freq: Four times a day (QID) | ORAL | Status: DC | PRN

## 2014-08-29 MED ORDER — ENALAPRILAT 1.25 MG/ML IV SOLN
INTRAVENOUS | Status: AC
  Administered 2014-08-29: 2.5 mg via INTRAVENOUS
  Filled 2014-08-29: qty 2

## 2014-08-29 NOTE — Progress Notes (Signed)
Subjective:   Patient presented to the emergency room for shortness of breath. She states that she went for dialysis on Friday. She has not missed any treatment. She does not know what happened over the weekend that led to shortness of breath. No fevers She does report cough with sputum  Objective:  Vital signs in last 24 hours:  Temp:  [97.2 F (36.2 C)] 97.2 F (36.2 C) (08/22 0634) Pulse Rate:  [69-117] 110 (08/22 1045) Resp:  [15-40] 27 (08/22 1045) BP: (116-163)/(91-128) 157/115 mmHg (08/22 1030) SpO2:  [86 %-100 %] 92 % (08/22 1045) FiO2 (%):  [100 %] 100 % (08/22 0634) Weight:  [71.215 kg (157 lb)-81.647 kg (180 lb)] 71.215 kg (157 lb) (08/22 0634)  Weight change:  Filed Weights   08/29/14 0604 08/29/14 0634  Weight: 81.647 kg (180 lb) 71.215 kg (157 lb)    Intake/Output:    Intake/Output Summary (Last 24 hours) at 08/29/14 1100 Last data filed at 08/29/14 1008  Gross per 24 hour  Intake      0 ml  Output    120 ml  Net   -120 ml     Physical Exam: General:  seen in the ER, ill-appearing   HEENT  BiPAP mask   Neck  supple   Pulm/lungs  bilateral diffuse crackles   CVS/Heart  regular rhythm, no rub or gallop   Abdomen:   soft, nontender, nondistended   Extremities:  trace peripheral edema   Neurologic:  nonfocal, alert,   Skin:  no acute rashes   Access:  right IJ PermCath        Basic Metabolic Panel:   Recent Labs Lab 08/29/14 0610  NA 139  K 5.0  CL 103  CO2 19*  GLUCOSE 210*  BUN 79*  CREATININE 9.54*  CALCIUM 8.2*     CBC:  Recent Labs Lab 08/29/14 0610  WBC 17.9*  NEUTROABS 12.2*  HGB 14.0  HCT 44.9  MCV 88.5  PLT 286      Microbiology:  Recent Results (from the past 720 hour(s))  Culture, blood (routine x 2)     Status: None (Preliminary result)   Collection Time: 08/29/14  6:10 AM  Result Value Ref Range Status   Specimen Description BLOOD RIGHT HAND  Final   Special Requests   Final    BOTTLES DRAWN AEROBIC AND  ANAEROBIC  4 CC AEROBIC 2 CC ANAEROBIC   Culture NO GROWTH < 12 HOURS  Final   Report Status PENDING  Incomplete    Coagulation Studies:  Recent Labs  08/29/14 0610  LABPROT 23.1*  INR 2.03    Urinalysis:  Recent Labs  08/29/14 0801  COLORURINE YELLOW*  LABSPEC 1.009  PHURINE 7.0  GLUCOSEU 150*  HGBUR 1+*  BILIRUBINUR NEGATIVE  KETONESUR NEGATIVE  PROTEINUR 100*  NITRITE NEGATIVE  LEUKOCYTESUR NEGATIVE      Imaging: Dg Chest Port 1 View  08/29/2014   CLINICAL DATA:  Respiratory distress. History of end-stage renal disease and volume overload.  EXAM: PORTABLE CHEST - 1 VIEW  COMPARISON:  02/02/2014  FINDINGS: Right-sided dialysis catheter with tip in the distal SVC. The heart is enlarged. Bilateral perihilar and infrahilar opacities consistent pulmonary edema. Suspect small left pleural effusion. No pneumothorax.  IMPRESSION: Cardiomegaly and pulmonary edema.  Suspect left pleural effusion.   Electronically Signed   By: Jeb Levering M.D.   On: 08/29/2014 06:56     Medications:     . metoprolol  50 mg Oral  BID   HYDROcodone-acetaminophen, traMADol  Assessment/ Plan:  59 y.o. female with end-stage renal disease, hypertension, cardiomyopathy, chronic systolic congestive heart failure with EF of 35% presents for chest tightness and shortness of breath and is diagnosed with pulmonary edema.  1. End-stage renal disease. [facility] nephrology. [employer]. M-W-F 2. Acute pulmonary edema requiring NIPPV  3. Anemia of chronic kidney disease 4. Secondary hyperparathyroidism  Plan: Urgent dialysis. Dialysis nurse has been notified. Dialysis will start as soon as patient is in her room in the ICU  continue BiPAP as necessary until fluid situation is optimized. UF with HD as tolerated We will monitor hemoglobin and electrolytes including phosphorus.    LOS: 0 [name] 8/22/201611:00 AM

## 2014-08-29 NOTE — ED Notes (Signed)
Pt taken off bi-pap per MD order.the patient placed on 5L ..talerated at present..will continue to monitor the pt.

## 2014-08-29 NOTE — Progress Notes (Signed)
*  PRELIMINARY RESULTS* Echocardiogram 2D Echocardiogram has been performed.  Teresa Cook 08/29/2014, 6:01 PM

## 2014-08-29 NOTE — ED Notes (Signed)
Pt placed on bedpan, 115ml of urine.Teresa Cook

## 2014-08-29 NOTE — ED Notes (Signed)
Patient to ED via EMS.  EMS reports patient reports difficulty breathing and chest pain for couple hours.

## 2014-08-29 NOTE — ED Notes (Signed)
Pt placed back on bipap..tolerating well 02 sat 100%.Marland Kitchen

## 2014-08-29 NOTE — Progress Notes (Signed)
ANTICOAGULATION CONSULT NOTE - Initial Consult  Pharmacy Consult for warfarin Indication: history of  pulmonary embolus  No Known Allergies  Patient Measurements: Height: 5\' 6"  (167.6 cm) Weight: 158 lb 8.2 oz (71.9 kg) IBW/kg (Calculated) : 59.3 Heparin Dosing Weight:   Vital Signs: Temp: 98 F (36.7 C) (08/22 1330) Temp Source: Oral (08/22 1330) BP: 114/72 mmHg (08/22 1445) Pulse Rate: 80 (08/22 1445)  Labs:  Recent Labs  08/29/14 0610  HGB 14.0  HCT 44.9  PLT 286  LABPROT 23.1*  INR 2.03  CREATININE 9.54*  TROPONINI <0.03    Estimated Creatinine Clearance: 6.4 mL/min (by C-G formula based on Cr of 9.54).   Medical History: Past Medical History  Diagnosis Date  . CHF (congestive heart failure)   . Hypertension   . Dialysis patient   . Parathyroid disease   . Depression   . Anxiety   . Psoriasis   . Gout   . DVT (deep venous thrombosis)     left leg    Medications:  Scheduled:  . [START ON 08/30/2014] cinacalcet  60 mg Oral Q breakfast  . citalopram  20 mg Oral Daily  . insulin aspart  0-9 Units Subcutaneous TID WC  . losartan  75 mg Oral Daily  . metoprolol  50 mg Oral BID  . pantoprazole  40 mg Oral Daily  . sertraline  200 mg Oral Daily  . sevelamer carbonate  800 mg Oral TID WC  . [START ON 09/04/2014] warfarin  5 mg Oral Once per day on Sun  . warfarin  7.5 mg Oral Once per day on Mon Tue Wed Thu Fri Sat  . Warfarin - Pharmacist Dosing Inpatient   Does not apply q1800    Assessment: Patient treated with coumadin for history of PE.  Goal of Therapy:  INR 2-3 Monitor platelets by anticoagulation protocol: Yes   Plan:  INR therapeutic. Will continue home dose of warfarin 7.5 mg PO daily EXCEPT 5 mg on Sunday.   Teresa Cook D 08/29/2014,3:16 PM

## 2014-08-29 NOTE — Progress Notes (Signed)
I called Dr. Posey Pronto regarding heparin and warfarin orders. Order received to discontinue heparin and Pharmacy consult for warfarin dosing.

## 2014-08-29 NOTE — ED Notes (Signed)
Pt O2 sats 88-90 on 6L [place] .  MD notified and wants the pt placed back on bi-pap.

## 2014-08-29 NOTE — ED Provider Notes (Signed)
[facility] Emergency Department Provider Note  ____________________________________________  Time seen: 5:55 AM on arrival by EMS  I have reviewed the triage vital signs and the nursing notes.   HISTORY  Chief Complaint Respiratory Distress and Chest Pain  history Limited by respiratory distress HPI Teresa Cook is a 59 y.o. female who called EMS for respiratory distress. The report that she is had difficulty breathing for the past 2-3 hours. She has history of end-stage renal disease on dialysis Monday Wednesday Friday. She went on Friday and has been compliant with all of her recent sessions. She has a history of volume overload requiring BiPAP and intubation and emergency dialysis. She denies any recent illness fever chills nausea vomiting. She complains of chest pain that radiates to her back. This was gradual in onset after the shortness of breath.  Found to have initial room air saturation of 68% by EMS, so they placed the patient on CPAP while transported to the hospital.   Past Medical History  Diagnosis Date  . CHF (congestive heart failure)   . Hypertension   . Dialysis patient   . Parathyroid disease   . Depression   . Anxiety   . Psoriasis   . Gout   . DVT (deep venous thrombosis)     left leg    Patient Active Problem List   Diagnosis Date Noted  . Chronic systolic heart failure 0000000  . HTN (hypertension) 05/16/2014  . Renal failure 05/16/2014  . Anxiety 05/16/2014    Past Surgical History  Procedure Laterality Date  . Partial hysterectomy    . Appendectomy      Current Outpatient Rx  Name  Route  Sig  Dispense  Refill  . cinacalcet (SENSIPAR) 60 MG tablet   Oral   Take 60 mg by mouth daily.         . citalopram (CELEXA) 20 MG tablet   Oral   Take 20 mg by mouth daily.         Marland Kitchen losartan (COZAAR) 50 MG tablet   Oral   Take 50 mg by mouth daily.         . metoprolol (LOPRESSOR) 100 MG tablet    Oral   Take 50 mg by mouth 2 (two) times daily.         . pantoprazole (PROTONIX) 40 MG tablet   Oral   Take 40 mg by mouth 2 (two) times daily.         . sertraline (ZOLOFT) 100 MG tablet   Oral   Take 200 mg by mouth daily.         . sevelamer carbonate (RENVELA) 800 MG tablet   Oral   Take 800 mg by mouth 3 (three) times daily with meals.         . traMADol (ULTRAM) 50 MG tablet   Oral   Take 50 mg by mouth every 8 (eight) hours as needed.         . warfarin (COUMADIN) 1 MG tablet   Oral   Take 1 mg by mouth daily. 1 tablet on Sunday and 1.5 tablets the other six days of the week         . zolpidem (AMBIEN) 10 MG tablet   Oral   Take 10 mg by mouth at bedtime as needed for sleep.           Allergies Review of patient's allergies indicates no known allergies.  Family History  Problem Relation Age of Onset  . Stroke Mother   . Hypertension Mother   . Hypertension Father   . Hypertension Sister   . Diabetes Sister   . Stroke Brother   . Hypertension Brother     Social History Social History  Substance Use Topics  . Smoking status: Never Smoker   . Smokeless tobacco: Never Used  . Alcohol Use: No    Review of Systems  Constitutional: No fever or chills. No weight changes Eyes:No blurry vision or double vision.  ENT: No sore throat. Cardiovascular: Positive chest pain. Respiratory: Positive dyspnea without cough. Gastrointestinal: Negative for abdominal pain, vomiting and diarrhea.  No BRBPR or melena. Genitourinary: Negative for dysuria, urinary retention, bloody urine, or difficulty urinating. Musculoskeletal: Negative for back pain. No joint swelling or pain. Skin: Negative for rash. Neurological: Negative for headaches, focal weakness or numbness. Psychiatric:No anxiety or depression.   Endocrine:No hot/cold intolerance, changes in energy, or sleep difficulty.  10-point ROS otherwise  negative.  ____________________________________________   PHYSICAL EXAM:  VITAL SIGNS: ED Triage Vitals  Enc Vitals Group     BP 08/29/14 0608 163/120 mmHg     Pulse Rate 08/29/14 0604 97     Resp 08/29/14 0608 40     Temp --      Temp src --      SpO2 08/29/14 0604 86 %     Weight 08/29/14 0604 180 lb (81.647 kg)     Height 08/29/14 0604 5\' 6"  (1.676 m)     Head Cir --      Peak Flow --      Pain Score 08/29/14 0603 8     Pain Loc --      Pain Edu? --      Excl. in [place]? --      Constitutional: Alert and oriented. Severe respiratory distress. Eyes: No scleral icterus. No conjunctival pallor. PERRL. EOMI ENT   Head: Normocephalic and atraumatic.   Nose: No congestion/rhinnorhea. No septal hematoma   Mouth/Throat: MMM, no pharyngeal erythema. No peritonsillar mass. No uvula shift.   Neck: No stridor. No SubQ emphysema. No meningismus. Hematological/Lymphatic/Immunilogical: No cervical lymphadenopathy. Cardiovascular: RRR. Normal and symmetric distal pulses are present in all extremities. No murmurs, rubs, or gallops. Respiratory: Coarse breath sounds diffusely. Tachypnea. Gastrointestinal: Soft and nontender. No distention. There is no CVA tenderness.  No rebound, rigidity, or guarding. Genitourinary: deferred Musculoskeletal: Nontender with normal range of motion in all extremities. No joint effusions.  No lower extremity tenderness.  No edema. Neurologic:   Normal speech and language.  CN 2-10 normal. Motor grossly intact. gcs = [id number] = 14 No gross focal neurologic deficits are appreciated.  Skin:  Skin is warm, dry and intact. No rash noted.  No petechiae, purpura, or bullae.   ____________________________________________    LABS (pertinent positives/negatives) (all labs ordered are listed, but only abnormal results are displayed) Labs Reviewed  COMPREHENSIVE METABOLIC PANEL - Abnormal; Notable for the following:    CO2 19 (*)    Glucose, Bld 210  (*)    BUN 79 (*)    Creatinine, Ser 9.54 (*)    Calcium 8.2 (*)    Total Protein 8.2 (*)    Alkaline Phosphatase 407 (*)    GFR calc non Af Amer 4 (*)    GFR calc Af Amer 5 (*)    Anion gap 17 (*)    All other components within normal limits  LACTIC ACID, PLASMA - Abnormal; Notable for  the following:    Lactic Acid, Venous 3.1 (*)    All other components within normal limits  CBC WITH DIFFERENTIAL/PLATELET - Abnormal; Notable for the following:    WBC 17.9 (*)    MCHC 31.3 (*)    RDW 16.6 (*)    Neutro Abs 12.2 (*)    Lymphs Abs 4.4 (*)    All other components within normal limits  PROTIME-INR - Abnormal; Notable for the following:    Prothrombin Time 23.1 (*)    All other components within normal limits  CULTURE, BLOOD (ROUTINE X 2)  CULTURE, BLOOD (ROUTINE X 2)  LIPASE, BLOOD  TROPONIN I  LACTIC ACID, PLASMA  URINALYSIS COMPLETEWITH MICROSCOPIC ([facility] ONLY)   ____________________________________________   EKG  Interpreted by me Normal sinus rhythm rate of 100, normal axis, mildly prolonged QTC. Poor R-wave progression in anterior precordial leads. LVH by voltage criteria with repolarization abnormality. No acute ischemic changes  ____________________________________________    RADIOLOGY  Chest x-ray reviewed and interpreted by me, radiology report reviewed, reveals diffuse pulmonary edema with cardiomegaly.  ____________________________________________   PROCEDURES CRITICAL CARE Performed by: Joni Fears, Suhaan Perleberg   Total critical care time: 35 minutes  Critical care time was exclusive of separately billable procedures and treating other patients.  Critical care was necessary to treat or prevent imminent or life-threatening deterioration.  Critical care was time spent personally by me on the following activities: development of treatment plan with patient and/or surrogate as well as nursing, discussions with consultants, evaluation of patient's response to  treatment, examination of patient, obtaining history from patient or surrogate, ordering and performing treatments and interventions, ordering and review of laboratory studies, ordering and review of radiographic studies, pulse oximetry and re-evaluation of patient's condition.  ____________________________________________   INITIAL IMPRESSION / ASSESSMENT AND PLAN / ED COURSE  Pertinent labs & imaging results that were available during my care of the patient were reviewed by me and considered in my medical decision making (see chart for details).  Patient placed on BiPAP immediately upon arrival in the ED. Initially her oxygen saturation was 84% on BiPAP. Ordered for IV push doses of Lasix and nitroglycerin and enalapril to offload pulmonary edema.  ----------------------------------------- 6:29 AM on 08/29/2014 -----------------------------------------  While maintaining a BiPAP oxygen saturation gradually improved up to 97%. Although the patient lies on the bed somewhat lethargic with her eyes closed, she is responsive and interacts appropriately when engaged. Does not appear to require intubation at this time although she will need to stay on noninvasive positive pressure ventilation until dialysis can be arranged. ----------------------------------------- 7:04 AM on 08/29/2014 -----------------------------------------  Labs significant for leukocytosis and lactate of 3.1. Chest x-ray does show pulmonary edema. I discussed the case with Dr. Holley Raring from nephrology who will place the patient on the dialysis scheduled for today. We'll plan to admit to the hospitalist. ____________________________________________   FINAL CLINICAL IMPRESSION(S) / ED DIAGNOSES  Final diagnoses:  Other hypervolemia  Acute pulmonary edema  Acute respiratory failure with hypoxia  End stage renal disease on dialysis   acute hypoxic respiratory failure due to volume overload    Carrie Mew,  MD 08/29/14 [phone]

## 2014-08-29 NOTE — H&P (Signed)
[facility] NAME: Teresa Cook    MR#:  [id number]  DATE OF BIRTH:  1955-11-17  DATE OF ADMISSION:  08/29/2014  PRIMARY CARE PHYSICIAN: Donnie Coffin, MD   REQUESTING/REFERRING PHYSICIAN: Dr Joni Fears  CHIEF COMPLAINT:  Increasing sob and chest tighness  HISTORY OF PRESENT ILLNESS:  Teresa Cook  is a 59 y.o. female with a known history of end-stage renal disease on hemodialysis, hypertension, cardiomyopathy with congestive heart failure EF around 35%, comes to the emergency room with increasing chest tightness and shortness of breath. Patient was found to have a lateral pulmonary edema on chest x-ray. She is afebrile. She was started on IV nitroglycerin drip, IV Lasix was given. Nephrology is aware of patient being admitted. Patient will need hemodialysis with ultrafiltration today. She is currently on BiPAP her sats are 99% on current BiPAP setting. Patient denies any fever or any productive cough. Her lactic acid was elevated at 3.1. Her white count is 17,000. No infection identified. PAST MEDICAL HISTORY:   Past Medical History  Diagnosis Date  . CHF (congestive heart failure)   . Hypertension   . Dialysis patient   . Parathyroid disease   . Depression   . Anxiety   . Psoriasis   . Gout   . DVT (deep venous thrombosis)     left leg    PAST SURGICAL HISTOIRY:   Past Surgical History  Procedure Laterality Date  . Partial hysterectomy    . Appendectomy      SOCIAL HISTORY:   Social History  Substance Use Topics  . Smoking status: Never Smoker   . Smokeless tobacco: Never Used  . Alcohol Use: No    FAMILY HISTORY:   Family History  Problem Relation Age of Onset  . Stroke Mother   . Hypertension Mother   . Hypertension Father   . Hypertension Sister   . Diabetes Sister   . Stroke Brother   . Hypertension Brother     DRUG ALLERGIES:  No Known Allergies  REVIEW OF SYSTEMS:   Review of Systems  Constitutional: Negative for fever, chills and diaphoresis.  HENT: Negative for congestion, ear pain, hearing loss and sore throat.   Eyes: Negative for blurred vision, double vision, photophobia and pain.  Respiratory: Negative for hemoptysis, sputum production, wheezing and stridor.   Cardiovascular: Positive for chest pain and orthopnea. Negative for claudication and leg swelling.  Gastrointestinal: Negative for heartburn and abdominal pain.  Genitourinary: Negative for dysuria and frequency.  Musculoskeletal: Negative for back pain, joint pain and neck pain.  Skin: Positive for rash.  Neurological: Positive for weakness. Negative for tingling, sensory change, speech change, focal weakness, seizures and headaches.  Endo/Heme/Allergies: Does not bruise/bleed easily.  Psychiatric/Behavioral: Negative for memory loss and substance abuse. The patient is not nervous/anxious.   All other systems reviewed and are negative.    MEDICATIONS AT HOME:   Prior to Admission medications   Medication Sig Start Date End Date Taking? Authorizing Provider  cinacalcet (SENSIPAR) 60 MG tablet Take 60 mg by mouth daily.   Yes Historical Provider, MD  citalopram (CELEXA) 20 MG tablet Take 20 mg by mouth daily.   Yes Historical Provider, MD  losartan (COZAAR) 50 MG tablet Take 75 mg by mouth daily.    Yes Historical Provider, MD  metoprolol (LOPRESSOR) 100 MG tablet Take 50 mg by mouth 2 (two) times daily.   Yes Historical Provider, MD  pantoprazole (  PROTONIX) 40 MG tablet Take 40 mg by mouth daily.    Yes Historical Provider, MD  sertraline (ZOLOFT) 100 MG tablet Take 200 mg by mouth daily.   Yes Historical Provider, MD  sevelamer carbonate (RENVELA) 800 MG tablet Take 800 mg by mouth 3 (three) times daily with meals.   Yes Historical Provider, MD  traMADol (ULTRAM) 50 MG tablet Take 50 mg by mouth every 8 (eight) hours as needed.   Yes Historical Provider, MD  warfarin (COUMADIN) 5 MG  tablet Take 5-7.5 mg by mouth daily. Take 7.5mg  on Monday-Saturday & 7.5mg  on Sunday   Yes Historical Provider, MD  zolpidem (AMBIEN) 10 MG tablet Take 10 mg by mouth at bedtime as needed for sleep.   Yes Historical Provider, MD      VITAL SIGNS:  Blood pressure 116/94, pulse 83, temperature 97.2 F (36.2 C), temperature source Axillary, resp. rate 27, height 5\' 6"  (1.676 m), weight 71.215 kg (157 lb), SpO2 93 %.  PHYSICAL EXAMINATION:  GENERAL:  59 y.o.-year-old patient lying in the bed with no acute distress. Appears critically ill, on BIPAP EYES: Pupils equal, round, reactive to light and accommodation. No scleral icterus. Extraocular muscles intact.  HEENT: Head atraumatic, normocephalic. Oropharynx and nasopharynx clear.  NECK:  Supple, no jugular venous distention. No thyroid enlargement, no tenderness.  LUNGS:  Decreased breath sounds bilaterally, no wheezing, rales,rhonchi or crepitation. No use of accessory muscles of respiration.  CARDIOVASCULAR: S1, S2 normal. No murmurs, rubs, or gallops. Mild tachy ABDOMEN: Soft, nontender, nondistended. Bowel sounds present. No organomegaly or mass.  EXTREMITIES: No pedal edema, cyanosis, or clubbing.  NEUROLOGIC: Cranial nerves II through XII are intact. Muscle strength 5/5 in all extremities. Sensation intact. Gait not checked.  PSYCHIATRIC: The patient is alert and oriented x 3.  SKIN: dry skin with mild rash on right tibial shin, no lesion, or ulcer.   LABORATORY PANEL:   CBC  Recent Labs Lab 08/29/14 0610  WBC 17.9*  HGB 14.0  HCT 44.9  PLT 286   ------------------------------------------------------------------------------------------------------------------  Chemistries   Recent Labs Lab 08/29/14 0610  NA 139  K 5.0  CL 103  CO2 19*  GLUCOSE 210*  BUN 79*  CREATININE 9.54*  CALCIUM 8.2*  AST 25  ALT 19  ALKPHOS 407*  BILITOT 0.4    ------------------------------------------------------------------------------------------------------------------  Cardiac Enzymes  Recent Labs Lab 08/29/14 0610  TROPONINI <0.03   ------------------------------------------------------------------------------------------------------------------  RADIOLOGY:  Dg Chest Port 1 View  08/29/2014   CLINICAL DATA:  Respiratory distress. History of end-stage renal disease and volume overload.  EXAM: PORTABLE CHEST - 1 VIEW  COMPARISON:  02/02/2014  FINDINGS: Right-sided dialysis catheter with tip in the distal SVC. The heart is enlarged. Bilateral perihilar and infrahilar opacities consistent pulmonary edema. Suspect small left pleural effusion. No pneumothorax.  IMPRESSION: Cardiomegaly and pulmonary edema.  Suspect left pleural effusion.   Electronically Signed   By: Jeb Levering M.D.   On: 08/29/2014 06:56    EKG:  NSR with LAE, LVH   IMPRESSION AND PLAN:   [name] is a 59 year old African American female with past medical history of end-stage renal disease on hemodialysis Monday Wednesday Friday comes to the emergency room with chest tightness and increasing shortness of breath. She was found to have hypoxic respiratory failure placed on BiPAP. Her chest x-ray shows bilateral pulmonary edema consistent with congestive heart failure acute on chronic systolic. She is being admitted with  1. Acute on chronic respiratory failure secondary to bilateral  pulmonary edema. We'll admit patient to ICU. Continue BiPAP. Wean as tolerated to keep sats greater than 92%. - nebs as needed. -Cardiac cardiology consultation if needed -Cycle cardiac enzymes 3 -Nephrology consultation for urgent hemodialysis and possible ultrafiltration. -Continue home medications  2. Acute on chronic congestive heart failure systolic. EF per echo in January was 35%. -Cycle cardiac enzymes 3. -echo -Continue cardiac meds  3. History of pulmonary embolism and  DVT. On Coumadin. Pharmacy to dose Coumadin.  4. GERD on PPI  5. Hyperglycemia  Check A1c .consider treatment if elevated.   6. Elevated white count and elevated lactic acid without any source of infection could be secondary to #1 We'll continue to monitor fever curve.   no indications for antibiotics at present.  7. DVT prophylaxis subcutaneous heparin  8. CODE STATUS full code  All the records are reviewed and case discussed with ED provider. Management plans discussed with the patient, family and they are in agreement.  CODE STATUS: Full   TOTAL  critical TIME TAKING CARE OF THIS PATIENT: 50  minutes.    Daleysa Kristiansen M.D on 08/29/2014 at 7:52 AM  Between 7am to 6pm - Pager - [phone]  After 6pm go to [email or web address] - password [email or web address] [facility]  Office  [phone]  CC: Primary care physician; Donnie Coffin, MD

## 2014-08-29 NOTE — ED Notes (Signed)
Lactic acid 3.1

## 2014-08-30 ENCOUNTER — Inpatient Hospital Stay: Payer: Medicare Other

## 2014-08-30 DIAGNOSIS — J810 Acute pulmonary edema: Secondary | ICD-10-CM | POA: Diagnosis present

## 2014-08-30 LAB — PARATHYROID HORMONE, INTACT (NO CA): PTH: 2032 pg/mL — ABNORMAL HIGH (ref 15–65)

## 2014-08-30 LAB — HEPATITIS B SURFACE ANTIGEN: Hepatitis B Surface Ag: NEGATIVE

## 2014-08-30 LAB — PROTIME-INR
INR: 2
Prothrombin Time: 22.8 seconds — ABNORMAL HIGH (ref 11.4–15.0)

## 2014-08-30 LAB — GLUCOSE, CAPILLARY
GLUCOSE-CAPILLARY: 99 mg/dL (ref 65–99)
GLUCOSE-CAPILLARY: 95 mg/dL (ref 65–99)

## 2014-08-30 NOTE — Discharge Summary (Signed)
[facility] NAME: Teresa Cook    MR#:  [id number]  DATE OF BIRTH:  05-09-1955  DATE OF ADMISSION:  08/29/2014 ADMITTING PHYSICIAN: Fritzi Mandes, MD  DATE OF DISCHARGE: 08/30/2014  PRIMARY CARE PHYSICIAN: Donnie Coffin, MD    ADMISSION DIAGNOSIS:  Acute pulmonary edema [J81.0] End stage renal disease on dialysis [N18.6, Z99.2] Acute respiratory failure with hypoxia [J96.01] Other hypervolemia [E87.79]  DISCHARGE DIAGNOSIS:  Acute pulmonary edema -resolved Acute hypoxic respiratory failure due to volume overload s/p urgent HD with UF-resolved ESRD on HD Known h/o Cardiomyopathy EF 45-50% ) by echo  SECONDARY DIAGNOSIS:   Past Medical History  Diagnosis Date  . CHF (congestive heart failure)   . Hypertension   . Dialysis patient   . Parathyroid disease   . Depression   . Anxiety   . Psoriasis   . Gout   . DVT (deep venous thrombosis)     left leg    HOSPITAL COURSE:   Teresa Cook is a 59 year old African American female with past medical history of end-stage renal disease on hemodialysis Monday Wednesday Friday comes to the emergency room with chest tightness and increasing shortness of breath. She was found to have hypoxic respiratory failure placed on BiPAP. Her chest x-ray shows bilateral pulmonary edema consistent with congestive heart failure acute on chronic systolic. She is being admitted with  1. Acute on chronic respiratory failure secondary to bilateral pulmonary edema due to dietary discretion -pt was placed on  BiPAP. Wean to RA now. sats >92% - nebs as needed. --Nephrology consultation for urgent hemodialysis and pt underwent ultrafiltration. -Continue home medications  2. Acute on chronic congestive heart failure systolic. EF per echo in January was 35%. -Echo y'day showed EF 45-50% -Continue cardiac meds -bp stable  3. History of pulmonary embolism and DVT. On Coumadin. Pharmacy to  dose Coumadin.  4. GERD on PPI  5. Hyperglycemia  a1c 5.1%  6. Elevated white count and elevated lactic acid without any source of infection could be secondary to #1 We'll continue to monitor fever curve.  no indications for antibiotics at present.  -lactic acid back to normal  7. DVT prophylaxis subcutaneous heparin  -overall much improved d/c home today D/w dr Candiss Norse  DISCHARGE CONDITIONS:   fair CONSULTS OBTAINED:   Dr Candiss Norse  DRUG ALLERGIES:  No Known Allergies  DISCHARGE MEDICATIONS:   Current Discharge Medication List    CONTINUE these medications which have NOT CHANGED   Details  cinacalcet (SENSIPAR) 60 MG tablet Take 60 mg by mouth daily.    citalopram (CELEXA) 20 MG tablet Take 20 mg by mouth daily.    losartan (COZAAR) 50 MG tablet Take 75 mg by mouth daily.     metoprolol (LOPRESSOR) 100 MG tablet Take 50 mg by mouth 2 (two) times daily.    pantoprazole (PROTONIX) 40 MG tablet Take 40 mg by mouth daily.     sertraline (ZOLOFT) 100 MG tablet Take 200 mg by mouth daily.    sevelamer carbonate (RENVELA) 800 MG tablet Take 800 mg by mouth 3 (three) times daily with meals.    traMADol (ULTRAM) 50 MG tablet Take 50 mg by mouth every 8 (eight) hours as needed.    warfarin (COUMADIN) 5 MG tablet Take 5-7.5 mg by mouth daily. Take 7.5mg  on Monday-Saturday & 5mg  on Sunday    zolpidem (AMBIEN) 10 MG tablet Take 10 mg by mouth at bedtime as needed  for sleep.        If you experience worsening of your admission symptoms, develop shortness of breath, life threatening emergency, suicidal or homicidal thoughts you must seek medical attention immediately by calling 911 or calling your MD immediately  if symptoms less severe.  You Must read complete instructions/literature along with all the possible adverse reactions/side effects for all the Medicines you take and that have been prescribed to you. Take any new Medicines after you have completely understood and  accept all the possible adverse reactions/side effects.   Please note  You were cared for by a hospitalist during your hospital stay. If you have any questions about your discharge medications or the care you received while you were in the hospital after you are discharged, you can call the unit and asked to speak with the hospitalist on call if the hospitalist that took care of you is not available. Once you are discharged, your primary care physician will handle any further medical issues. Please note that NO REFILLS for any discharge medications will be authorized once you are discharged, as it is imperative that you return to your primary care physician (or establish a relationship with a primary care physician if you do not have one) for your aftercare needs so that they can reassess your need for medications and monitor your lab values. Today   SUBJECTIVE   Doing well  VITAL SIGNS:  Blood pressure 121/79, pulse 77, temperature 97.8 F (36.6 C), temperature source Oral, resp. rate 15, height 5\' 6"  (1.676 m), weight 71.9 kg (158 lb 8.2 oz), SpO2 97 %.  I/O:   Intake/Output Summary (Last 24 hours) at 08/30/14 0901 Last data filed at 08/30/14 0844  Gross per 24 hour  Intake    340 ml  Output   1620 ml  Net  -1280 ml    PHYSICAL EXAMINATION:  GENERAL:  59 y.o.-year-old patient lying in the bed with no acute distress.  EYES: Pupils equal, round, reactive to light and accommodation. No scleral icterus. Extraocular muscles intact.  HEENT: Head atraumatic, normocephalic. Oropharynx and nasopharynx clear.  NECK:  Supple, no jugular venous distention. No thyroid enlargement, no tenderness.  LUNGS: Normal breath sounds bilaterally, no wheezing, rales,rhonchi or crepitation. No use of accessory muscles of respiration.  CARDIOVASCULAR: S1, S2 normal. No murmurs, rubs, or gallops.  ABDOMEN: Soft, non-tender, non-distended. Bowel sounds present. No organomegaly or mass.  EXTREMITIES: No pedal  edema, cyanosis, or clubbing.  NEUROLOGIC: Cranial nerves II through XII are intact. Muscle strength 5/5 in all extremities. Sensation intact. Gait not checked.  PSYCHIATRIC: The patient is alert and oriented x 3.  SKIN: No obvious rash, lesion, or ulcer.   DATA REVIEW:   CBC   Recent Labs Lab 08/29/14 0610  WBC 17.9*  HGB 14.0  HCT 44.9  PLT 286    Chemistries   Recent Labs Lab 08/29/14 0610  NA 139  K 5.0  CL 103  CO2 19*  GLUCOSE 210*  BUN 79*  CREATININE 9.54*  CALCIUM 8.2*  AST 25  ALT 19  ALKPHOS 407*  BILITOT 0.4    Microbiology Results   Recent Results (from the past 240 hour(s))  Culture, blood (routine x 2)     Status: None (Preliminary result)   Collection Time: 08/29/14  6:10 AM  Result Value Ref Range Status   Specimen Description BLOOD RIGHT HAND  Final   Special Requests   Final    BOTTLES DRAWN AEROBIC AND ANAEROBIC  4 CC AEROBIC 2 CC ANAEROBIC   Culture NO GROWTH 1 DAY  Final   Report Status PENDING  Incomplete  MRSA PCR Screening     Status: None   Collection Time: 08/29/14 12:45 PM  Result Value Ref Range Status   MRSA by PCR NEGATIVE NEGATIVE Final    Comment:        The GeneXpert MRSA Assay (FDA approved for NASAL specimens only), is one component of a comprehensive MRSA colonization surveillance program. It is not intended to diagnose MRSA infection nor to guide or monitor treatment for MRSA infections.     RADIOLOGY:  Portable Chest 1 View  08/30/2014   CLINICAL DATA:  Shortness of breath. Congestive heart failure. Dialysis patient.  EXAM: PORTABLE CHEST - 1 VIEW  COMPARISON:  Single view of the chest 08/29/2014.  FINDINGS: Pulmonary edema has resolved. Cardiomegaly is again seen. No pneumothorax or pleural effusion.  IMPRESSION: Resolved pulmonary edema.  No acute disease.   Electronically Signed   By: Inge Rise M.D.   On: 08/30/2014 08:42   Dg Chest Port 1 View  08/29/2014   CLINICAL DATA:  Respiratory distress.  History of end-stage renal disease and volume overload.  EXAM: PORTABLE CHEST - 1 VIEW  COMPARISON:  02/02/2014  FINDINGS: Right-sided dialysis catheter with tip in the distal SVC. The heart is enlarged. Bilateral perihilar and infrahilar opacities consistent pulmonary edema. Suspect small left pleural effusion. No pneumothorax.  IMPRESSION: Cardiomegaly and pulmonary edema.  Suspect left pleural effusion.   Electronically Signed   By: Jeb Levering M.D.   On: 08/29/2014 06:56     Management plans discussed with the patient, family and they are in agreement.  CODE STATUS:     Code Status Orders        Start     Ordered   08/29/14 1429  Full code   Continuous     08/29/14 1428      TOTAL TIME TAKING CARE OF THIS PATIENT: 40 minutes.    Alaynah Schutter M.D on 08/30/2014 at 9:01 AM  Between 7am to 6pm - Pager - [phone] After 6pm go to [email or web address] - password [email or web address] [facility]  Office  [phone]  CC: Primary care physician; Donnie Coffin, MD

## 2014-08-30 NOTE — Progress Notes (Signed)
PT Cancellation Note  Patient Details Name: Teresa Cook MRN: [id number] DOB: 1955-06-06   Cancelled Treatment:    Reason Eval/Treat Not Completed: Other (comment) (Consult received and chart reviewed.   Per primary RN, patient has mobilized around unit and demonstrated no safety concerns.  No acute PT needs reported per RN.  Will complete initial order; reconsult should needs change.)   Yusuke Beza H. Owens Shark, PT, DPT, NCS 08/30/2014, 9:07 AM [phone]

## 2014-08-30 NOTE — Progress Notes (Signed)
ANTICOAGULATION CONSULT NOTE - Initial Consult  Pharmacy Consult for warfarin Indication: history of  pulmonary embolus  No Known Allergies  Patient Measurements: Height: 5\' 6"  (167.6 cm) Weight: 158 lb 8.2 oz (71.9 kg) IBW/kg (Calculated) : 59.3 Heparin Dosing Weight:   Vital Signs: BP: 121/79 mmHg (08/23 0500) Pulse Rate: 77 (08/23 0500)  Labs:  Recent Labs  08/29/14 0610 08/30/14 0500  HGB 14.0  --   HCT 44.9  --   PLT 286  --   LABPROT 23.1* 22.8*  INR 2.03 2.00  CREATININE 9.54*  --   TROPONINI <0.03  --     Estimated Creatinine Clearance: 6.4 mL/min (by C-G formula based on Cr of 9.54).   Medical History: Past Medical History  Diagnosis Date  . CHF (congestive heart failure)   . Hypertension   . Dialysis patient   . Parathyroid disease   . Depression   . Anxiety   . Psoriasis   . Gout   . DVT (deep venous thrombosis)     left leg    Medications:  Scheduled:  . cinacalcet  60 mg Oral Q breakfast  . citalopram  20 mg Oral Daily  . insulin aspart  0-9 Units Subcutaneous TID WC  . losartan  75 mg Oral Daily  . metoprolol  50 mg Oral BID  . pantoprazole  40 mg Oral Daily  . sertraline  200 mg Oral Daily  . sevelamer carbonate  800 mg Oral TID WC  . [START ON 09/04/2014] warfarin  5 mg Oral Once per day on Sun  . warfarin  7.5 mg Oral Once per day on Mon Tue Wed Thu Fri Sat  . Warfarin - Pharmacist Dosing Inpatient   Does not apply q1800    Assessment: Patient treated with coumadin for history of PE.  Goal of Therapy:  INR 2-3 Monitor platelets by anticoagulation protocol: Yes   Plan:  INR therapeutic. Will continue home dose of warfarin 7.5 mg PO daily EXCEPT 5 mg on Sunday.   8/23 INR 2.0. Continue current regimen. INR in AM.  Markas Aldredge S 08/30/2014,5:44 AM

## 2014-08-30 NOTE — Discharge Instructions (Signed)
Renal, low salt diet

## 2014-08-30 NOTE — Progress Notes (Signed)
ANTICOAGULATION CONSULT NOTE - Follow Up Consult  Pharmacy Consult for warfarin Indication: history of  pulmonary embolus  No Known Allergies  Patient Measurements: Height: 5\' 6"  (167.6 cm) Weight: 158 lb 8.2 oz (71.9 kg) IBW/kg (Calculated) : 59.3 Heparin Dosing Weight:   Vital Signs: Temp: 98.6 F (37 C) (08/23 0900) Temp Source: Oral (08/23 0900) BP: 122/76 mmHg (08/23 1200) Pulse Rate: 65 (08/23 1200)  Labs:  Recent Labs  08/29/14 0610 08/30/14 0500  HGB 14.0  --   HCT 44.9  --   PLT 286  --   LABPROT 23.1* 22.8*  INR 2.03 2.00  CREATININE 9.54*  --   TROPONINI <0.03  --     Estimated Creatinine Clearance: 6.4 mL/min (by C-G formula based on Cr of 9.54).   Medical History: Past Medical History  Diagnosis Date  . CHF (congestive heart failure)   . Hypertension   . Dialysis patient   . Parathyroid disease   . Depression   . Anxiety   . Psoriasis   . Gout   . DVT (deep venous thrombosis)     left leg    Medications:  Scheduled:  . cinacalcet  60 mg Oral Q breakfast  . citalopram  20 mg Oral Daily  . losartan  75 mg Oral Daily  . metoprolol  50 mg Oral BID  . pantoprazole  40 mg Oral Daily  . sertraline  200 mg Oral Daily  . sevelamer carbonate  800 mg Oral TID WC  . [START ON 09/04/2014] warfarin  5 mg Oral Once per day on Sun  . warfarin  7.5 mg Oral Once per day on Mon Tue Wed Thu Fri Sat  . Warfarin - Pharmacist Dosing Inpatient   Does not apply q1800    Assessment: Patient treated with coumadin for history of PE.   INR today of 2.0  Goal of Therapy:  INR 2-3 Monitor platelets by anticoagulation protocol: Yes   Plan:  INR therapeutic. Will continue home dose of warfarin 7.5 mg PO daily EXCEPT 5 mg on Sunday.   Leron Stoffers G 08/30/2014,1:38 PM

## 2014-08-30 NOTE — Progress Notes (Signed)
Patient given discharge instructions. Discussed diet at length. Patient verbalized information back. All questions answered. No further needs. Daughter at side. W/C to car via volunteer services. PIV x 2 removed earlier - sites CD. ABC intact. D/C home.

## 2014-08-30 NOTE — Progress Notes (Signed)
Subjective:   Patient reports hypotension during dialysis 2000 cc were removed   Objective:  Vital signs in last 24 hours:  Temp:  [97.8 F (36.6 C)-98 F (36.7 C)] 97.8 F (36.6 C) (08/22 1600) Pulse Rate:  [56-117] 77 (08/23 0500) Resp:  [15-30] 15 (08/23 0500) BP: (111-163)/(70-128) 121/79 mmHg (08/23 0500) SpO2:  [91 %-100 %] 97 % (08/23 0500) FiO2 (%):  [35 %] 35 % (08/22 1212) Weight:  [70 kg (154 lb 5.2 oz)-71.9 kg (158 lb 8.2 oz)] 71.9 kg (158 lb 8.2 oz) (08/22 1215)  Weight change: -11.648 kg (-25 lb 10.9 oz) Filed Weights   08/29/14 1200 08/29/14 1212 08/29/14 1215  Weight: 70 kg (154 lb 5.2 oz) 70 kg (154 lb 5.2 oz) 71.9 kg (158 lb 8.2 oz)    Intake/Output:    Intake/Output Summary (Last 24 hours) at 08/30/14 [id number] Last data filed at 08/30/14 0844  Gross per 24 hour  Intake    340 ml  Output   1620 ml  Net  -1280 ml     Physical Exam: General:  seen in the ER, ill-appearing   HEENT  BiPAP mask   Neck  supple   Pulm/lungs  bilateral mild basilar crackles   CVS/Heart  regular rhythm, no rub or gallop   Abdomen:   soft, nontender, nondistended   Extremities:  trace peripheral edema   Neurologic:  nonfocal, alert,   Skin:  no acute rashes , rt shin psoriasis  Access:  right IJ PermCath        Basic Metabolic Panel:   Recent Labs Lab 08/29/14 0610 08/29/14 1255  NA 139  --   K 5.0  --   CL 103  --   CO2 19*  --   GLUCOSE 210*  --   BUN 79*  --   CREATININE 9.54*  --   CALCIUM 8.2*  --   PHOS  --  4.5     CBC:  Recent Labs Lab 08/29/14 0610  WBC 17.9*  NEUTROABS 12.2*  HGB 14.0  HCT 44.9  MCV 88.5  PLT 286      Microbiology:  Recent Results (from the past 720 hour(s))  Culture, blood (routine x 2)     Status: None (Preliminary result)   Collection Time: 08/29/14  6:10 AM  Result Value Ref Range Status   Specimen Description BLOOD RIGHT HAND  Final   Special Requests   Final    BOTTLES DRAWN AEROBIC AND ANAEROBIC  4 CC  AEROBIC 2 CC ANAEROBIC   Culture NO GROWTH 1 DAY  Final   Report Status PENDING  Incomplete  MRSA PCR Screening     Status: None   Collection Time: 08/29/14 12:45 PM  Result Value Ref Range Status   MRSA by PCR NEGATIVE NEGATIVE Final    Comment:        The GeneXpert MRSA Assay (FDA approved for NASAL specimens only), is one component of a comprehensive MRSA colonization surveillance program. It is not intended to diagnose MRSA infection nor to guide or monitor treatment for MRSA infections.     Coagulation Studies:  Recent Labs  08/29/14 0610 08/30/14 0500  LABPROT 23.1* 22.8*  INR 2.03 2.00    Urinalysis:  Recent Labs  08/29/14 0801  COLORURINE YELLOW*  LABSPEC 1.009  PHURINE 7.0  GLUCOSEU 150*  HGBUR 1+*  BILIRUBINUR NEGATIVE  KETONESUR NEGATIVE  PROTEINUR 100*  NITRITE NEGATIVE  LEUKOCYTESUR NEGATIVE      Imaging: Portable Chest  1 View  08/30/2014   CLINICAL DATA:  Shortness of breath. Congestive heart failure. Dialysis patient.  EXAM: PORTABLE CHEST - 1 VIEW  COMPARISON:  Single view of the chest 08/29/2014.  FINDINGS: Pulmonary edema has resolved. Cardiomegaly is again seen. No pneumothorax or pleural effusion.  IMPRESSION: Resolved pulmonary edema.  No acute disease.   Electronically Signed   By: Inge Rise M.D.   On: 08/30/2014 08:42   Dg Chest Port 1 View  08/29/2014   CLINICAL DATA:  Respiratory distress. History of end-stage renal disease and volume overload.  EXAM: PORTABLE CHEST - 1 VIEW  COMPARISON:  02/02/2014  FINDINGS: Right-sided dialysis catheter with tip in the distal SVC. The heart is enlarged. Bilateral perihilar and infrahilar opacities consistent pulmonary edema. Suspect small left pleural effusion. No pneumothorax.  IMPRESSION: Cardiomegaly and pulmonary edema.  Suspect left pleural effusion.   Electronically Signed   By: Jeb Levering M.D.   On: 08/29/2014 06:56     Medications:     . cinacalcet  60 mg Oral Q breakfast  .  citalopram  20 mg Oral Daily  . losartan  75 mg Oral Daily  . metoprolol  50 mg Oral BID  . pantoprazole  40 mg Oral Daily  . sertraline  200 mg Oral Daily  . sevelamer carbonate  800 mg Oral TID WC  . [START ON 09/04/2014] warfarin  5 mg Oral Once per day on Sun  . warfarin  7.5 mg Oral Once per day on Mon Tue Wed Thu Fri Sat  . Warfarin - Pharmacist Dosing Inpatient   Does not apply q1800   sodium chloride, sodium chloride, acetaminophen **OR** acetaminophen, alteplase, feeding supplement (NEPRO CARB STEADY), heparin, HYDROcodone-acetaminophen, lidocaine (PF), lidocaine-prilocaine, ondansetron **OR** ondansetron (ZOFRAN) IV, pentafluoroprop-tetrafluoroeth, senna-docusate, traMADol, zolpidem  Assessment/ Plan:  59 y.o. female with end-stage renal disease, hypertension, cardiomyopathy, chronic systolic congestive heart failure with EF of 35% presents for chest tightness and shortness of breath and is diagnosed with pulmonary edema.  1. End-stage renal disease. [facility] nephrology. [employer]. M-W-F 2. Acute pulmonary edema requiring NIPPV- improved  3. Anemia of chronic kidney disease 4. Secondary hyperparathyroidism  Plan: pulm status has improved Plan to ambulate without oxygen OK to d/c home from renal standpoint Recommended Lasix 80 mg po q AM on non dialysis days.    LOS: 1 Jediah Horger 8/23/20169:03 AM

## 2014-09-03 LAB — CULTURE, BLOOD (ROUTINE X 2): CULTURE: NO GROWTH

## 2014-09-06 DIAGNOSIS — I519 Heart disease, unspecified: Secondary | ICD-10-CM | POA: Insufficient documentation

## 2014-09-19 ENCOUNTER — Encounter: Payer: Self-pay | Admitting: Emergency Medicine

## 2014-09-19 ENCOUNTER — Emergency Department: Payer: Medicare Other

## 2014-09-19 ENCOUNTER — Inpatient Hospital Stay
Admission: EM | Admit: 2014-09-19 | Discharge: 2014-09-20 | DRG: 189 | Disposition: A | Discharge status: Home Health | Payer: Medicare Other | Attending: Internal Medicine | Admitting: Internal Medicine

## 2014-09-19 DIAGNOSIS — Z9115 Patient's noncompliance with renal dialysis: Secondary | ICD-10-CM | POA: Diagnosis present

## 2014-09-19 DIAGNOSIS — F418 Other specified anxiety disorders: Secondary | ICD-10-CM | POA: Diagnosis present

## 2014-09-19 DIAGNOSIS — Z7901 Long term (current) use of anticoagulants: Secondary | ICD-10-CM | POA: Diagnosis not present

## 2014-09-19 DIAGNOSIS — Z992 Dependence on renal dialysis: Secondary | ICD-10-CM

## 2014-09-19 DIAGNOSIS — N186 End stage renal disease: Secondary | ICD-10-CM | POA: Diagnosis present

## 2014-09-19 DIAGNOSIS — Z79899 Other long term (current) drug therapy: Secondary | ICD-10-CM

## 2014-09-19 DIAGNOSIS — I120 Hypertensive chronic kidney disease with stage 5 chronic kidney disease or end stage renal disease: Secondary | ICD-10-CM | POA: Diagnosis present

## 2014-09-19 DIAGNOSIS — N2581 Secondary hyperparathyroidism of renal origin: Secondary | ICD-10-CM | POA: Diagnosis present

## 2014-09-19 DIAGNOSIS — F419 Anxiety disorder, unspecified: Secondary | ICD-10-CM | POA: Diagnosis present

## 2014-09-19 DIAGNOSIS — I5033 Acute on chronic diastolic (congestive) heart failure: Secondary | ICD-10-CM | POA: Diagnosis present

## 2014-09-19 DIAGNOSIS — F329 Major depressive disorder, single episode, unspecified: Secondary | ICD-10-CM | POA: Diagnosis present

## 2014-09-19 DIAGNOSIS — J9601 Acute respiratory failure with hypoxia: Principal | ICD-10-CM | POA: Diagnosis present

## 2014-09-19 DIAGNOSIS — M109 Gout, unspecified: Secondary | ICD-10-CM | POA: Diagnosis present

## 2014-09-19 DIAGNOSIS — D631 Anemia in chronic kidney disease: Secondary | ICD-10-CM | POA: Diagnosis present

## 2014-09-19 DIAGNOSIS — J810 Acute pulmonary edema: Secondary | ICD-10-CM

## 2014-09-19 DIAGNOSIS — Z86718 Personal history of other venous thrombosis and embolism: Secondary | ICD-10-CM

## 2014-09-19 DIAGNOSIS — L409 Psoriasis, unspecified: Secondary | ICD-10-CM | POA: Diagnosis present

## 2014-09-19 DIAGNOSIS — R0602 Shortness of breath: Secondary | ICD-10-CM | POA: Diagnosis present

## 2014-09-19 HISTORY — DX: Chronic kidney disease, unspecified: N18.9

## 2014-09-19 HISTORY — DX: Disorder of parathyroid gland, unspecified: E21.5

## 2014-09-19 HISTORY — DX: Essential (primary) hypertension: I10

## 2014-09-19 LAB — COMPREHENSIVE METABOLIC PANEL
ALK PHOS: 430 U/L — AB (ref 38–126)
ALT: 16 U/L (ref 14–54)
ANION GAP: 16 — AB (ref 5–15)
AST: 19 U/L (ref 15–41)
Albumin: 3.8 g/dL (ref 3.5–5.0)
BILIRUBIN TOTAL: 0.6 mg/dL (ref 0.3–1.2)
BUN: 88 mg/dL — ABNORMAL HIGH (ref 6–20)
CALCIUM: 8.2 mg/dL — AB (ref 8.9–10.3)
CO2: 20 mmol/L — ABNORMAL LOW (ref 22–32)
Chloride: 101 mmol/L (ref 101–111)
Creatinine, Ser: 9.51 mg/dL — ABNORMAL HIGH (ref 0.44–1.00)
GFR, EST AFRICAN AMERICAN: 5 mL/min — AB (ref >60)
GFR, EST NON AFRICAN AMERICAN: 4 mL/min — AB (ref >60)
GLUCOSE: 113 mg/dL — AB (ref 65–99)
POTASSIUM: 4.7 mmol/L (ref 3.5–5.1)
Sodium: 137 mmol/L (ref 135–145)
TOTAL PROTEIN: 7.6 g/dL (ref 6.5–8.1)

## 2014-09-19 LAB — CBC WITH DIFFERENTIAL/PLATELET
BASOS ABS: 0.1 10*3/uL (ref 0–0.1)
BASOS PCT: 1 %
Eosinophils Absolute: 0.3 10*3/uL (ref 0–0.7)
Eosinophils Relative: 2 %
HEMATOCRIT: 42.2 % (ref 35.0–47.0)
Hemoglobin: 13.3 g/dL (ref 12.0–16.0)
Lymphocytes Relative: 21 %
Lymphs Abs: 2.7 10*3/uL (ref 1.0–3.6)
MCH: 27.8 pg (ref 26.0–34.0)
MCHC: 31.5 g/dL — ABNORMAL LOW (ref 32.0–36.0)
MCV: 88.4 fL (ref 80.0–100.0)
MONO ABS: 0.8 10*3/uL (ref 0.2–0.9)
Monocytes Relative: 7 %
NEUTROS ABS: 8.6 10*3/uL — AB (ref 1.4–6.5)
NEUTROS PCT: 69 %
PLATELETS: 268 10*3/uL (ref 150–440)
RBC: 4.77 MIL/uL (ref 3.80–5.20)
RDW: 16.1 % — AB (ref 11.5–14.5)
WBC: 12.5 10*3/uL — AB (ref 3.6–11.0)

## 2014-09-19 LAB — TROPONIN I: Troponin I: <0.03 ng/mL (ref <0.031)

## 2014-09-19 LAB — LIPASE, BLOOD: LIPASE: 53 U/L — AB (ref 22–51)

## 2014-09-19 LAB — MRSA PCR SCREENING: MRSA BY PCR: NEGATIVE

## 2014-09-19 MED ORDER — HEPARIN SODIUM (PORCINE) 1000 UNIT/ML IJ SOLN
3800.0000 [IU] | INTRAMUSCULAR | Status: DC | PRN
  Administered 2014-09-19: 3800 [IU]
  Filled 2014-09-19: qty 3.8

## 2014-09-19 MED ORDER — CHLORHEXIDINE GLUCONATE 0.12 % MT SOLN
15.0000 mL | Freq: Two times a day (BID) | OROMUCOSAL | Status: DC
  Administered 2014-09-19 – 2014-09-20 (×2): 15 mL via OROMUCOSAL
  Filled 2014-09-19 (×2): qty 15

## 2014-09-19 MED ORDER — ONDANSETRON HCL 4 MG/2ML IJ SOLN: 4.0000 mg | Freq: Four times a day (QID) | INTRAMUSCULAR | Status: DC | PRN

## 2014-09-19 MED ORDER — SODIUM CHLORIDE 0.9 % IJ SOLN
3.0000 mL | Freq: Two times a day (BID) | INTRAMUSCULAR | Status: DC
  Administered 2014-09-19 – 2014-09-20 (×3): 3 mL via INTRAVENOUS

## 2014-09-19 MED ORDER — FUROSEMIDE 10 MG/ML IJ SOLN
40.0000 mg | Freq: Once | INTRAMUSCULAR | Status: AC
  Administered 2014-09-19: 40 mg via INTRAVENOUS

## 2014-09-19 MED ORDER — HEPARIN SODIUM (PORCINE) 5000 UNIT/ML IJ SOLN
5000.0000 [IU] | Freq: Three times a day (TID) | INTRAMUSCULAR | Status: DC
  Administered 2014-09-19 – 2014-09-20 (×3): 5000 [IU] via SUBCUTANEOUS
  Filled 2014-09-19 (×3): qty 1

## 2014-09-19 MED ORDER — ACETAMINOPHEN 650 MG RE SUPP
650.0000 mg | Freq: Four times a day (QID) | RECTAL | Status: DC | PRN
Start: 2014-09-19 — End: 2014-09-20

## 2014-09-19 MED ORDER — NITROGLYCERIN 2 % TD OINT
0.5000 [in_us] | TOPICAL_OINTMENT | Freq: Four times a day (QID) | TRANSDERMAL | Status: DC
  Administered 2014-09-19 – 2014-09-20 (×4): 0.5 [in_us] via TOPICAL
  Filled 2014-09-19 (×4): qty 1

## 2014-09-19 MED ORDER — FUROSEMIDE 10 MG/ML IJ SOLN
INTRAMUSCULAR | Status: AC
  Administered 2014-09-19: 40 mg via INTRAVENOUS
  Filled 2014-09-19: qty 4

## 2014-09-19 MED ORDER — HYDRALAZINE HCL 20 MG/ML IJ SOLN: 10.0000 mg | Freq: Four times a day (QID) | INTRAMUSCULAR | Status: DC | PRN

## 2014-09-19 MED ORDER — HEPARIN SODIUM (PORCINE) 1000 UNIT/ML IJ SOLN
3000.0000 [IU] | Freq: Once | INTRAMUSCULAR | Status: DC
  Filled 2014-09-19: qty 3

## 2014-09-19 MED ORDER — ZOLPIDEM TARTRATE 5 MG PO TABS
5.0000 mg | ORAL_TABLET | Freq: Every evening | ORAL | Status: DC | PRN
  Administered 2014-09-19: 5 mg via ORAL
  Filled 2014-09-19: qty 1

## 2014-09-19 MED ORDER — ENALAPRILAT 1.25 MG/ML IV SOLN
1.2500 mg | Freq: Once | INTRAVENOUS | Status: AC
  Administered 2014-09-19: 1.25 mg via INTRAVENOUS
  Filled 2014-09-19: qty 2

## 2014-09-19 MED ORDER — SODIUM CHLORIDE 0.9 % IV SOLN: 250.0000 mL | INTRAVENOUS | Status: DC | PRN

## 2014-09-19 MED ORDER — ACETAMINOPHEN 325 MG PO TABS
650.0000 mg | ORAL_TABLET | Freq: Four times a day (QID) | ORAL | Status: DC | PRN
  Administered 2014-09-19: 650 mg via ORAL
  Filled 2014-09-19: qty 2

## 2014-09-19 MED ORDER — SODIUM CHLORIDE 0.9 % IJ SOLN: 3.0000 mL | INTRAMUSCULAR | Status: DC | PRN

## 2014-09-19 MED ORDER — ALPRAZOLAM 0.5 MG PO TABS: 0.5000 mg | ORAL_TABLET | Freq: Every day | ORAL | Status: DC | PRN

## 2014-09-19 MED ORDER — TRAMADOL HCL 50 MG PO TABS
50.0000 mg | ORAL_TABLET | Freq: Four times a day (QID) | ORAL | Status: DC | PRN
  Administered 2014-09-19 – 2014-09-20 (×2): 50 mg via ORAL
  Filled 2014-09-19 (×2): qty 1

## 2014-09-19 MED ORDER — CLONAZEPAM 0.5 MG PO TABS
0.2500 mg | ORAL_TABLET | Freq: Every day | ORAL | Status: DC
  Administered 2014-09-20: 0.25 mg via ORAL
  Filled 2014-09-19: qty 1

## 2014-09-19 MED ORDER — ONDANSETRON HCL 4 MG PO TABS: 4.0000 mg | ORAL_TABLET | Freq: Four times a day (QID) | ORAL | Status: DC | PRN

## 2014-09-19 MED ORDER — IPRATROPIUM-ALBUTEROL 0.5-2.5 (3) MG/3ML IN SOLN
3.0000 mL | RESPIRATORY_TRACT | Status: AC
  Administered 2014-09-19 (×3): 3 mL via RESPIRATORY_TRACT

## 2014-09-19 MED ORDER — LOSARTAN POTASSIUM 50 MG PO TABS
50.0000 mg | ORAL_TABLET | Freq: Every day | ORAL | Status: DC
Start: 2014-09-19 — End: 2014-09-20
  Administered 2014-09-20: 50 mg via ORAL
  Filled 2014-09-19: qty 1

## 2014-09-19 NOTE — Progress Notes (Signed)
Subjective:   Patient presents via EMS for shortness of breath. She was placed on BiPAP. Admitting team is requesting evaluation for urgent dialysis Patient's daughter is at bedside Patient is currently on BiPAP, therefore, able to provide only minimal history She admits to not taking lasix regularly as prescribed Does not think she ate too much salt or drank too much fluid  Objective:  Vital signs in last 24 hours:  Temp:  [97.8 F (36.6 C)] 97.8 F (36.6 C) (09/12 0920) Pulse Rate:  [85-106] 85 (09/12 1115) Resp:  [20-29] 21 (09/12 1115) BP: (140-188)/(112-130) 166/112 mmHg (09/12 1100) SpO2:  [94 %-100 %] 98 % (09/12 1115) FiO2 (%):  [50 %] 50 % (09/12 0914) Weight:  [72.8 kg (160 lb 7.9 oz)] 72.8 kg (160 lb 7.9 oz) (09/12 0920)  Weight change:  Filed Weights   09/19/14 0920  Weight: 72.8 kg (160 lb 7.9 oz)    Intake/Output:   No intake or output data in the 24 hours ending 09/19/14 1203   Physical Exam: General: Critically ill appearing, laying in bed  HEENT BiPAP in place  Neck supple  Pulm/lungs B/l crackles, BiPAP  CVS/Heart Regular, no rub  Abdomen:  Soft, non tender  Extremities: No peripheral edema  Neurologic: Alert, able to follow commands  Skin: No acute rashes  Access: Rt IJ PC       Basic Metabolic Panel:   Recent Labs Lab 09/19/14 0913  NA 137  K 4.7  CL 101  CO2 20*  GLUCOSE 113*  BUN 88*  CREATININE 9.51*  CALCIUM 8.2*     CBC:  Recent Labs Lab 09/19/14 0913  WBC 12.5*  NEUTROABS 8.6*  HGB 13.3  HCT 42.2  MCV 88.4  PLT 268      Microbiology:  No results found for this or any previous visit (from the past 720 hour(s)).  Coagulation Studies: No results for input(s): LABPROT, INR in the last 72 hours.  Urinalysis: No results for input(s): COLORURINE, LABSPEC, PHURINE, GLUCOSEU, HGBUR, BILIRUBINUR, KETONESUR, PROTEINUR, UROBILINOGEN, NITRITE, LEUKOCYTESUR in the last 72 hours.  Invalid input(s): APPERANCEUR     Imaging: Dg Chest Port 1 View  09/19/2014   CLINICAL DATA:  Acute severe shortness of breath.  EXAM: PORTABLE CHEST - 1 VIEW  COMPARISON:  None.  FINDINGS: Upper limits normal heart size noted. Pulmonary vascular congestion and interstitial opacities are noted, likely interstitial edema.  There are is no evidence of pneumothorax or large pleural effusion.  A right central venous catheter is noted with tips overlying the mid and lower SVC.  No acute bony abnormalities are identified.  IMPRESSION: Pulmonary vascular congestion with interstitial opacities -likely interstitial edema.  Upper limits normal heart size.   Electronically Signed   By: Margarette Canada M.D.   On: 09/19/2014 09:39     Medications:   . sodium chloride     . heparin  5,000 Units Subcutaneous 3 times per day  . nitroGLYCERIN  0.5 inch Topical 4 times per day  . sodium chloride  3 mL Intravenous Q12H   sodium chloride, acetaminophen **OR** acetaminophen, hydrALAZINE, ondansetron **OR** ondansetron (ZOFRAN) IV, sodium chloride  Assessment/ Plan:  59 y.o. female with end-stage renal disease, hypertension, cardiomyopathy, chronic systolic congestive heart failure with EF of [id number], grade 1 diastolic dysfuncion presents for shortness of breath and is diagnosed with pulmonary edema. Similar presentation in   1. End-stage renal disease. [facility] nephrology. [employer]. M-W-F 2. Acute pulmonary edema requiring NIPPV 3. Anemia  of chronic kidney disease 4. Secondary hyperparathyroidism  Plan: Urgent HD today. UF goal ~ 2-3 kg as tolerated Re-assess for HD daily    LOS: 0 Jennessy Sandridge 9/12/201612:03 PM

## 2014-09-19 NOTE — Progress Notes (Signed)
PRE HD   09/19/14 1400  Report  Report Received From [place], RN  Vital Signs  Temp 97.6 F (36.4 C)  Temp Source Axillary  Pulse Rate 85  Pulse Rate Source Monitor  Resp (!) 25  BP (!) 164/106 mmHg  BP Location Right Arm  BP Method Automatic  Patient Position (if appropriate) Lying  Oxygen Therapy  SpO2 100 %  O2 Device Bi-PAP  Pain Assessment  Pain Assessment No/denies pain  Pain Score 0  Dialysis Weight  Weight 70.6 kg (155 lb 10.3 oz)  Type of Weight Pre-Dialysis  Time-Out for Hemodialysis  What Procedure? HEMODIALYSIS  Pt Identifiers(min of two) First/Last Name;MRN/Account#  Correct Site? Yes  Correct Side? Yes  Correct Procedure? Yes  Consents Verified? Yes  Rad Studies Available? N/A  Safety Precautions Reviewed? Yes  Machine Checks  Machine Number [phone]  UF/Alarm Test Passed  Conductivity: Meter 13.7  Conductivity: Machine  13.5  pH 7.4  Dialyzer Lot Number [id number]  Disposable Set Lot Number [id number]  Machine Temperature 98.6 F (37 C)  Musician and Audible Yes  Blood Lines Intact and Secured Yes  Pre Treatment Patient Checks  Vascular access used during treatment Catheter  Hepatitis B Surface Antibody 150  Date Hepatitis B Surface Antibody Drawn 05/04/14  Hemodialysis Consent Verified Yes  Hemodialysis Standing Orders Initiated Yes  ECG (Telemetry) Monitor On Yes  Prime Ordered Normal Saline  Length of  DialysisTreatment -hour(s) 3 Hour(s)  Dialysis Treatment Comments HD TREATMENET INITITATED. GOAL TO REMOVE 3L IN 3 HOURS. PT ALERT AND VS STABLE. CVC ACCESSED WITHOUT DIFFICULTY.  Dialyzer Optiflux 180 NR  Dialysate 2.5 Ca (3K)  Dialysis Anticoagulant None  Dialysate Flow Ordered 800  Blood Flow Rate Ordered 400 mL/min  Ultrafiltration Goal 3 Liters  Pre Treatment Labs Renal panel;CBC;Hepatitis B Surface Antigen  Dialysis Blood Pressure Support Ordered Normal Saline  Hemodialysis Catheter Right Internal jugular Double-lumen   No Placement Date or Time found.   Placed prior to admission: Yes  Orientation: Right  Access Location: Internal jugular  Hemodialysis Catheter Type: Double-lumen  Site Condition No complications  Blue Lumen Status Heparin locked  Red Lumen Status Heparin locked  Purple Lumen Status N/A  Catheter fill solution Heparin 1000 units/ml  Catheter fill volume (Arterial) 2 cc  Catheter fill volume (Venous) 2.2  Dressing Type Gauze/Drain sponge  Dressing Status Clean;Dry;Intact  Interventions Dressing changed  Drainage Description None  Dressing Change Due 09/21/14

## 2014-09-19 NOTE — ED Provider Notes (Signed)
[facility] Emergency Department Provider Note  ____________________________________________  Time seen: 9:05 AM on arrival by EMS  I have reviewed the triage vital signs and the nursing notes.   HISTORY  Chief Complaint Shortness of Breath    HPI Teresa Cook is a 59 y.o. female who is a history of end-stage renal disease on dialysis Monday Wednesday Friday who reports being compliant with her dialysis recently. This morning she was at home preparing to leave to go to her usual dialysis session when she developed chest pain and severe shortness of breath. EMS was called who noted the patient to behypoxic with oxygen saturation of about 85% on room air so they put her on CPAP. Chest pain is a midsternal tightness, nonradiating, nonexertional. No aggravating or alleviating factors.  Patient reports she does still produce urine and takes Lasix 20 mg by mouth twice daily.   Past Medical History  Diagnosis Date  . CHF (congestive heart failure)   . Dialysis patient   . HTN (hypertension)   . Parathyroid abnormality   . Depression   . Anxiety   . Psoriasis   . Gout   . DVT (deep venous thrombosis)      Patient Active Problem List   Diagnosis Date Noted  . Acute respiratory failure 09/19/2014     Past Surgical History  Procedure Laterality Date  . Abdominal hysterectomy    . Appendectomy       Current Outpatient Rx  Name  Route  Sig  Dispense  Refill  . ALPRAZolam (XANAX) 0.5 MG tablet   Oral   Take 0.5 mg by mouth daily as needed for anxiety.         . chlorhexidine (PERIDEX) 0.12 % solution   Mouth/Throat   Use as directed 15 mLs in the mouth or throat 2 (two) times daily.         . clonazePAM (KLONOPIN) 0.5 MG tablet   Oral   Take 0.25 mg by mouth daily. Taken before dialysis         . losartan (COZAAR) 50 MG tablet   Oral   Take 50 mg by mouth daily.         . traMADol (ULTRAM) 50 MG tablet   Oral   Take by mouth  every 8 (eight) hours as needed.            Allergies Review of patient's allergies indicates no known allergies.   Family History  Problem Relation Age of Onset  . CVA Mother   . Hypertension Mother     Social History Social History  Substance Use Topics  . Smoking status: Never Smoker   . Smokeless tobacco: None  . Alcohol Use: No    Review of Systems  Constitutional:   No fever or chills. No weight changes Eyes:   No blurry vision or double vision.  ENT:   No sore throat. Cardiovascular:   Positive chest pain. Respiratory:   Positive dyspnea or cough. Gastrointestinal:   Negative for abdominal pain, vomiting and diarrhea.  No BRBPR or melena. Genitourinary:   Negative for dysuria, urinary retention, bloody urine, or difficulty urinating. Musculoskeletal:   Negative for back pain. No joint swelling or pain. Skin:   Negative for rash. Neurological:   Negative for headaches, focal weakness or numbness. Psychiatric:  No anxiety or depression.   Endocrine:  No hot/cold intolerance, changes in energy, or sleep difficulty.  10-point ROS otherwise negative.  ____________________________________________   PHYSICAL EXAM:  VITAL SIGNS: ED Triage Vitals  Enc Vitals Group     BP 09/19/14 0920 176/126 mmHg     Pulse Rate 09/19/14 0905 100     Resp 09/19/14 0905 29     Temp 09/19/14 0920 97.8 F (36.6 C)     Temp Source 09/19/14 0920 Oral     SpO2 09/19/14 0905 100 %     Weight 09/19/14 0920 160 lb 7.9 oz (72.8 kg)     Height 09/19/14 0920 5\' 6"  (1.676 m)     Head Cir --      Peak Flow --      Pain Score --      Pain Loc --      Pain Edu? --      Excl. in [place]? --      Constitutional:   Alert and oriented. Severe respiratory distress. Eyes:   No scleral icterus. No conjunctival pallor. PERRL. EOMI ENT   Head:   Normocephalic and atraumatic.   Nose:   No congestion/rhinnorhea. No septal hematoma   Mouth/Throat:   MMM, no pharyngeal erythema. No  peritonsillar mass. No uvula shift.   Neck:   No stridor. No SubQ emphysema. No meningismus. Hematological/Lymphatic/Immunilogical:   No cervical lymphadenopathy. Cardiovascular:   RRR. Normal and symmetric distal pulses are present in all extremities. No murmurs, rubs, or gallops. Respiratory:   Tachypnea, accessory muscle use. Diffuse crackles with diminished breath sounds at the bases. Gastrointestinal:   Soft and nontender. No distention. There is no CVA tenderness.  No rebound, rigidity, or guarding. Genitourinary:   deferred Musculoskeletal:   Nontender with normal range of motion in all extremities. No joint effusions.  No lower extremity tenderness.  No appreciable peripheral edema. Neurologic:   Normal speech and language.  CN 2-10 normal. Motor grossly intact. No pronator drift.  Normal gait. No gross focal neurologic deficits are appreciated.  Skin:    Skin is warm, dry and intact. No rash noted.  No petechiae, purpura, or bullae. Psychiatric:   Mood and affect are normal. Speech and behavior are normal. Patient exhibits appropriate insight and judgment.  ____________________________________________    LABS (pertinent positives/negatives) (all labs ordered are listed, but only abnormal results are displayed) Labs Reviewed  LIPASE, BLOOD - Abnormal; Notable for the following:    Lipase 53 (*)    All other components within normal limits  COMPREHENSIVE METABOLIC PANEL - Abnormal; Notable for the following:    CO2 20 (*)    Glucose, Bld 113 (*)    BUN 88 (*)    Creatinine, Ser 9.51 (*)    Calcium 8.2 (*)    Alkaline Phosphatase 430 (*)    GFR calc non Af Amer 4 (*)    GFR calc Af Amer 5 (*)    Anion gap 16 (*)    All other components within normal limits  CBC WITH DIFFERENTIAL/PLATELET - Abnormal; Notable for the following:    WBC 12.5 (*)    MCHC 31.5 (*)    RDW 16.1 (*)    Neutro Abs 8.6 (*)    All other components within normal limits  TROPONIN I  CBC   CREATININE, SERUM   ____________________________________________   EKG  Interpreted by me Normal sinus rhythm rate of 98, normal axis and intervals. Voltage criteria for left ventricular hypertrophy with repolarization abnormality of the T waves in the lateral leads. No ST changes.  ____________________________________________    RADIOLOGY  Chest x-ray reveals pulmonary interstitial edema.  ____________________________________________  PROCEDURES CRITICAL CARE Performed by: Joni Fears, Kaylany Tesoriero   Total critical care time: 35 minutes  Critical care time was exclusive of separately billable procedures and treating other patients.  Critical care was necessary to treat or prevent imminent or life-threatening deterioration.  Critical care was time spent personally by me on the following activities: development of treatment plan with patient and/or surrogate as well as nursing, discussions with consultants, evaluation of patient's response to treatment, examination of patient, obtaining history from patient or surrogate, ordering and performing treatments and interventions, ordering and review of laboratory studies, ordering and review of radiographic studies, pulse oximetry and re-evaluation of patient's condition.   ____________________________________________   INITIAL IMPRESSION / ASSESSMENT AND PLAN / ED COURSE  Pertinent labs & imaging results that were available during my care of the patient were reviewed by me and considered in my medical decision making (see chart for details).  Patient presents with respiratory distress and likely volume overload related to end-stage renal disease. Patient placed on BiPAP immediately on arrival in the emergency room. We'll give her enalapril and IV Lasix to help with her symptoms. Also give DuoNeb's 3.  ----------------------------------------- 12:15 PM on 09/19/2014 -----------------------------------------  Throughout her  course, the patient continued to improve on BiPAP. She felt that her respirations were becoming more comfortable and less typical after medications and BiPAP. After obtaining the labs and chest x-ray, I discussed with the hospitalist who will admit the patient pending dialysis due to volume overload and pulmonary edema.     ____________________________________________   FINAL CLINICAL IMPRESSION(S) / ED DIAGNOSES  Final diagnoses:  Acute respiratory failure with hypoxia  Acute pulmonary edema      Carrie Mew, MD 09/19/14 1215

## 2014-09-19 NOTE — ED Notes (Signed)
Patient arrives by EMS with BIPAP on, reported SOB this am, denies chest pain, resting with HOB elevated and BIPAP applied by RT, eyes closed, resp unlabored, states has had BIPAP before, MD< RN x3 and tech X2 to bedside for initial treatments.

## 2014-09-19 NOTE — ED Notes (Signed)
Patient is resting comfortably. 

## 2014-09-19 NOTE — Progress Notes (Signed)
POST HD   09/19/14 1730  Report  Report Received From [facility]  Vital Signs  Temp 98.1 F (36.7 C)  Temp Source Oral  Pulse Rate 100  Pulse Rate Source Monitor  Resp (!) 24  BP (!) 165/110 mmHg  BP Location Right Arm  BP Method Automatic  Patient Position (if appropriate) Lying  During Hemodialysis Assessment  Intra-Hemodialysis Comments HD TREATMENT END. GOAL NOT MET DUE TO PT CRAMPING. BLOOD RETURNED AND CVC ACCESSED PER POLICY.  PT ALERT AND VS REMAIN STABLE.   Post-Hemodialysis Assessment  Rinseback Volume (mL) 250 mL  Dialyzer Clearance Lightly streaked  Duration of HD Treatment -hour(s) 3 hour(s)  Hemodialysis Intake (mL) 500 mL  UF Total -Machine (mL) 2868 mL  Net UF (mL) 2368 mL  Tolerated HD Treatment Yes  Post-Hemodialysis Comments 2868= HD TREATMENT END. GOAL NOT MET DUE TO PT CRAMPING. BLOOD RETURNED AND CVC ACCESSED PER POLICY.  PT ALERT AND VS REMAIN STABLE.   AVG/AVF Arterial Site Held (minutes) 7 minutes  AVG/AVF Venous Site Held (minutes) 7 minutes  Education / Care Plan  Hemodialysis Education Provided Yes  Documented Education in Clinical Pathway Yes  Hemodialysis Catheter Right Internal jugular Double-lumen  No Placement Date or Time found.   Placed prior to admission: Yes  Orientation: Right  Access Location: Internal jugular  Hemodialysis Catheter Type: Double-lumen  Site Condition No complications  Blue Lumen Status Heparin locked  Red Lumen Status Heparin locked  Purple Lumen Status N/A  Catheter fill solution Heparin 1000 units/ml  Catheter fill volume (Arterial) 2 cc  Catheter fill volume (Venous) 2.2  Dressing Type Gauze/Drain sponge  Dressing Status Clean;Dry;Intact  Post treatment catheter status Capped and Clamped

## 2014-09-19 NOTE — Progress Notes (Signed)
POST   09/19/14 1715  Neurological  Level of Consciousness Alert  Orientation Level Oriented X4  Respiratory  Respiratory Pattern Regular;Unlabored  Chest Assessment Chest expansion symmetrical  Bilateral Breath Sounds Diminished  Cough None  Cardiac  Pulse Regular  ECG Monitor Yes  Cardiac Rhythm ST  Vascular  Edema (NONE)  Integumentary  Integumentary (WDL) WDL  Skin Color Appropriate for ethnicity  Musculoskeletal  Musculoskeletal (WDL) X  Generalized Weakness Yes  Assistive Device None  Gastrointestinal  Bowel Sounds Assessment Active  GU Assessment  Genitourinary (WDL) WDL  Psychosocial  Psychosocial (WDL) WDL  Emotional support given Given to patient   HD

## 2014-09-19 NOTE — ED Notes (Signed)
Family at bedside. 

## 2014-09-19 NOTE — Progress Notes (Signed)
No BIPAP in room at this time.  O2 sat is 95% on room air and patient in no respiratory distress.

## 2014-09-19 NOTE — Progress Notes (Signed)
PRE HD   09/19/14 1400  Neurological  Level of Consciousness Alert  Orientation Level Oriented X4  Respiratory  Respiratory Pattern Regular;Unlabored  Chest Assessment Chest expansion symmetrical  Bilateral Breath Sounds Fine crackles  Cough None  Cardiac  Pulse Regular  ECG Monitor Yes  Cardiac Rhythm NSR  Vascular  Edema Other (Comment) (NONE)  Integumentary  Integumentary (WDL) WDL  Musculoskeletal  Musculoskeletal (WDL) X  Generalized Weakness Yes  Assistive Device None  Gastrointestinal  Bowel Sounds Assessment Active  GU Assessment  Genitourinary (WDL) WDL (HD)  Psychosocial  Psychosocial (WDL) X  Patient Behaviors Calm;Cooperative  Needs Expressed Physical  Emotional support given Given to patient

## 2014-09-19 NOTE — Progress Notes (Signed)
Placed call to Dr. Bridgett Larsson, prime doc. regarding pt's request for ambien.  Pt takes ambien at home 10 mg tablet at bedtime.  Per Dr. Bridgett Larsson, order can be placed for pt's home med of ambien, 10mg  tablet to be taken at bedtime.

## 2014-09-19 NOTE — H&P (Signed)
[facility] [place] NAME: Teresa Cook    MR#:  [id number]  DATE OF BIRTH:  1955-04-21  DATE OF ADMISSION:  09/19/2014  PRIMARY CARE PHYSICIAN: Donnie Coffin, MD   REQUESTING/REFERRING PHYSICIAN:  Dr. Joni Fears MD  CHIEF COMPLAINT:   Chief Complaint  Patient presents with  . Shortness of Breath    began this am, history of CHF, arrives on BiPAP    HISTORY OF PRESENT ILLNESS: Teresa Cook  is a 59 y.o. female with a known history of congestive heart failure, end-stage renal disease, hypertension,, depression, anxiety presents to the emergency room with the acute respiratory failure. Patient had to be placed on BiPAP. Chest x-ray suggestive of congestive heart failure. Patient reports shortness of breath started since this morning. She reports that her last dialysis was on Friday on her routine dialysis day. She also has feeling of chest pressure. Denies any lower extremity swelling. She has had a productive cough white color in nature denies any fevers.  PAST MEDICAL HISTORY:   Past Medical History  Diagnosis Date  . CHF (congestive heart failure)   . Dialysis patient   . HTN (hypertension)   . Parathyroid abnormality   . Depression   . Anxiety   . Psoriasis   . Gout   . DVT (deep venous thrombosis)     PAST SURGICAL HISTORY:  Past Surgical History  Procedure Laterality Date  . Abdominal hysterectomy    . Appendectomy      SOCIAL HISTORY:  Social History  Substance Use Topics  . Smoking status: Never Smoker   . Smokeless tobacco: Not on file  . Alcohol Use: No    FAMILY HISTORY:  Family History  Problem Relation Age of Onset  . CVA Mother   . Hypertension Mother     DRUG ALLERGIES: No Known Allergies  REVIEW OF SYSTEMS:   CONSTITUTIONAL: No fever, fatigue or weakness.  EYES: No blurred or double vision.  EARS, NOSE, AND THROAT: No tinnitus or ear pain.  RESPIRATORY: No cough, positive shortness of  breath, wheezing or hemoptysis.  CARDIOVASCULAR: Complains of chest pressure, orthopnea, edema.  GASTROINTESTINAL: No nausea, vomiting, diarrhea or abdominal pain.  GENITOURINARY: No dysuria, hematuria.  ENDOCRINE: No polyuria, nocturia,  HEMATOLOGY: No anemia, easy bruising or bleeding SKIN: No rash or lesion. MUSCULOSKELETAL: No joint pain or arthritis.   NEUROLOGIC: No tingling, numbness, weakness.  PSYCHIATRY: No anxiety or depression.   MEDICATIONS AT HOME:  Prior to Admission medications   Medication Sig Start Date End Date Taking? Authorizing Provider  ALPRAZolam Duanne Moron) 0.5 MG tablet Take 0.5 mg by mouth daily as needed for anxiety.   Yes Historical Provider, MD  chlorhexidine (PERIDEX) 0.12 % solution Use as directed 15 mLs in the mouth or throat 2 (two) times daily.   Yes Historical Provider, MD  clonazePAM (KLONOPIN) 0.5 MG tablet Take 0.25 mg by mouth daily. Taken before dialysis   Yes Historical Provider, MD  losartan (COZAAR) 50 MG tablet Take 50 mg by mouth daily.   Yes Historical Provider, MD  traMADol (ULTRAM) 50 MG tablet Take by mouth every 8 (eight) hours as needed.   Yes Historical Provider, MD      PHYSICAL EXAMINATION:   VITAL SIGNS: Blood pressure 166/112, pulse 85, temperature 97.8 F (36.6 C), temperature source Oral, resp. rate 21, height 5\' 6"  (1.676 m), weight 72.8 kg (160 lb 7.9 oz), SpO2 98 %.  GENERAL:  59 y.o.-year-old patient  lying in the bed critically ill-appearing on BiPAP currently EYES: Pupils equal, round, reactive to light and accommodation. No scleral icterus. Extraocular muscles intact.  HEENT: Head atraumatic, normocephalic. Oropharynx and nasopharynx clear.  NECK:  Supple, no jugular venous distention. No thyroid enlargement, no tenderness.  LUNGS: Accesory muscle usage on BiPAP, bilateral crackles at the bases CARDIOVASCULAR: S1, S2 normal. No murmurs, rubs, or gallops.  ABDOMEN: Soft, nontender, nondistended. Bowel sounds present. No  organomegaly or mass.  EXTREMITIES: No pedal edema, cyanosis, or clubbing.  NEUROLOGIC: Cranial nerves II through XII are intact. Muscle strength 5/5 in all extremities. Sensation intact. Gait not checked.  PSYCHIATRIC: The patient is alert and oriented x 3.  SKIN: No obvious rash, lesion, or ulcer.   LABORATORY PANEL:   CBC  Recent Labs Lab 09/19/14 0913  WBC 12.5*  HGB 13.3  HCT 42.2  PLT 268  MCV 88.4  MCH 27.8  MCHC 31.5*  RDW 16.1*  LYMPHSABS 2.7  MONOABS 0.8  EOSABS 0.3  BASOSABS 0.1   ------------------------------------------------------------------------------------------------------------------  Chemistries   Recent Labs Lab 09/19/14 0913  NA 137  K 4.7  CL 101  CO2 20*  GLUCOSE 113*  BUN 88*  CREATININE 9.51*  CALCIUM 8.2*  AST 19  ALT 16  ALKPHOS 430*  BILITOT 0.6   ------------------------------------------------------------------------------------------------------------------ estimated creatinine clearance is 6.4 mL/min (by C-G formula based on Cr of 9.51). ------------------------------------------------------------------------------------------------------------------ No results for input(s): TSH, T4TOTAL, T3FREE, THYROIDAB in the last 72 hours.  Invalid input(s): FREET3   Coagulation profile No results for input(s): INR, PROTIME in the last 168 hours. ------------------------------------------------------------------------------------------------------------------- No results for input(s): DDIMER in the last 72 hours. -------------------------------------------------------------------------------------------------------------------  Cardiac Enzymes  Recent Labs Lab 09/19/14 0913  TROPONINI <0.03   ------------------------------------------------------------------------------------------------------------------ Invalid input(s):  POCBNP  ---------------------------------------------------------------------------------------------------------------  Urinalysis No results found for: COLORURINE, APPEARANCEUR, LABSPEC, PHURINE, GLUCOSEU, HGBUR, BILIRUBINUR, KETONESUR, PROTEINUR, UROBILINOGEN, NITRITE, LEUKOCYTESUR   RADIOLOGY: Dg Chest Port 1 View  09/19/2014   CLINICAL DATA:  Acute severe shortness of breath.  EXAM: PORTABLE CHEST - 1 VIEW  COMPARISON:  None.  FINDINGS: Upper limits normal heart size noted. Pulmonary vascular congestion and interstitial opacities are noted, likely interstitial edema.  There are is no evidence of pneumothorax or large pleural effusion.  A right central venous catheter is noted with tips overlying the mid and lower SVC.  No acute bony abnormalities are identified.  IMPRESSION: Pulmonary vascular congestion with interstitial opacities -likely interstitial edema.  Upper limits normal heart size.   Electronically Signed   By: Margarette Canada M.D.   On: 09/19/2014 09:39    EKG: Orders placed or performed during the hospital encounter of 09/19/14  . EKG 12-Lead  . EKG 12-Lead    IMPRESSION AND PLAN: Patient is a 58 year old African-American female with end-stage renal disease with acute respiratory failure  1. Acute respiratory failure due to fluid overload and congestive heart failure likely diastolic in nature: I have discussed the case with Dr. Candiss Norse of nephrology who will be dialyzing the patient later today.  2. Accelerated hypertension we'll start her on a Nitropatch use when necessary hydralazine continue Cozaar  3. Anxiety depression: Continue alprazolam and Klonopin which is her usual home regimen  4. Miscellaneous heparin for DVT prophylaxis    All the records are reviewed and case discussed with ED provider. Management plans discussed with the patient, family and they are in agreement.  CODE STATUS:    Code Status Orders        Start  Ordered   09/19/14 1147  Full  code   Continuous     09/19/14 1147       TOTAL TIME TAKING CARE OF THIS PATIENT: 55 minutes. Critical care time   Dustin Flock M.D on 09/19/2014 at 12:03 PM  Between 7am to 6pm - Pager - [phone]  After 6pm go to [email or web address] - password [email or web address] [facility]  Office  [phone]  CC: Primary care physician; Donnie Coffin, MD

## 2014-09-20 LAB — BASIC METABOLIC PANEL
ANION GAP: 16 — AB (ref 5–15)
BUN: 64 mg/dL — ABNORMAL HIGH (ref 6–20)
CHLORIDE: 97 mmol/L — AB (ref 101–111)
CO2: 25 mmol/L (ref 22–32)
CREATININE: 7.49 mg/dL — AB (ref 0.44–1.00)
Calcium: 9 mg/dL (ref 8.9–10.3)
GFR calc non Af Amer: 5 mL/min — ABNORMAL LOW (ref >60)
GFR, EST AFRICAN AMERICAN: 6 mL/min — AB (ref >60)
GLUCOSE: 98 mg/dL (ref 65–99)
Potassium: 4.2 mmol/L (ref 3.5–5.1)
Sodium: 138 mmol/L (ref 135–145)

## 2014-09-20 LAB — PROTIME-INR
INR: 1.95
Prothrombin Time: 22.4 seconds — ABNORMAL HIGH (ref 11.4–15.0)

## 2014-09-20 LAB — CBC
HEMATOCRIT: 37.6 % (ref 35.0–47.0)
HEMOGLOBIN: 12.4 g/dL (ref 12.0–16.0)
MCH: 28.6 pg (ref 26.0–34.0)
MCHC: 33.1 g/dL (ref 32.0–36.0)
MCV: 86.5 fL (ref 80.0–100.0)
Platelets: 172 10*3/uL (ref 150–440)
RBC: 4.34 MIL/uL (ref 3.80–5.20)
RDW: 15.8 % — AB (ref 11.5–14.5)
WBC: 8.1 10*3/uL (ref 3.6–11.0)

## 2014-09-20 MED ORDER — WARFARIN SODIUM 5 MG PO TABS: 5.0000 mg | ORAL_TABLET | Freq: Every day | ORAL | Status: DC

## 2014-09-20 MED ORDER — WARFARIN SODIUM 4 MG PO TABS
8.0000 mg | ORAL_TABLET | Freq: Once | ORAL | Status: AC
  Administered 2014-09-20: 8 mg via ORAL
  Filled 2014-09-20: qty 2

## 2014-09-20 MED ORDER — WARFARIN - PHYSICIAN DOSING INPATIENT: Freq: Every day | Status: DC

## 2014-09-20 MED ORDER — FUROSEMIDE 20 MG PO TABS: 20.0000 mg | ORAL_TABLET | Freq: Every day | ORAL | Status: DC

## 2014-09-20 MED ORDER — FUROSEMIDE 20 MG PO TABS
20.0000 mg | ORAL_TABLET | Freq: Every day | ORAL | Status: DC
  Administered 2014-09-20: 20 mg via ORAL
  Filled 2014-09-20: qty 1

## 2014-09-20 MED ORDER — WARFARIN SODIUM 7.5 MG PO TABS: 7.5000 mg | ORAL_TABLET | ORAL | Status: DC

## 2014-09-20 MED ORDER — WARFARIN SODIUM 5 MG PO TABS: 5.0000 mg | ORAL_TABLET | ORAL | Status: DC

## 2014-09-20 NOTE — Discharge Summary (Signed)
[facility] NAME: Teresa Cook    MR#:  [id number]  DATE OF BIRTH:  Sep 16, 1955  DATE OF ADMISSION:  09/19/2014 ADMITTING PHYSICIAN: Dustin Flock, MD  DATE OF DISCHARGE: 09/20/2014  PRIMARY CARE PHYSICIAN: Donnie Coffin, MD    ADMISSION DIAGNOSIS:  Acute pulmonary edema [J81.0] Acute respiratory failure with hypoxia [J96.01]  DISCHARGE DIAGNOSIS:  Acute respiratory failure secondary to fluid overload  SECONDARY DIAGNOSIS:   Past Medical History  Diagnosis Date  . CHF (congestive heart failure)   . Dialysis patient   . HTN (hypertension)   . Parathyroid abnormality   . Depression   . Anxiety   . Psoriasis   . Gout   . DVT (deep venous thrombosis)   . Chronic kidney disease     HOSPITAL COURSE:   Patient is a 59 year old African-American female with end-stage renal disease with acute respiratory failure  1. Acute respiratory failure due to fluid overload and congestive heart failure likely diastolic in nature: Patient had urgent hemodialysis yesterday and doing fine today. Resumed home dose Lasix 20 mg by mouth once daily as recommended by Dr. Candiss Norse  Appreciate nephrology recommendations  Okay to discharge from nephrology standpoint.  Patient is to continue outpatient hemodialysis with [facility] nephrology group on Monday, Wednesday and Friday  2. Accelerated hypertension  Improved after hemodialysis. continue Cozaar  3. Anxiety depression: Continue alprazolam and Klonopin which is her usual home regimen   4. Chronic history of DVT On Coumadin, INR at 1.95 today. Will give her 8 mg of Coumadin prior to discharge today and patient has to resume her home medication 7.5 mg from Monday through Friday starting from tomorrow. Continue 5 mg on Saturdays and Sundays Monitor PT/INR closely and repeat PT/INR in a.m. at outpatient dialysis center and Coumadin management per [facility] nephrology  DISCHARGE CONDITIONS:    Satisfactory  CONSULTS OBTAINED:  Treatment Team:  Murlean Iba, MD   PROCEDURES -urgent hemodialysis was done on September 12  DRUG ALLERGIES:  No Known Allergies  DISCHARGE MEDICATIONS:   Current Discharge Medication List    START taking these medications   Details  furosemide (LASIX) 20 MG tablet Take 1 tablet (20 mg total) by mouth daily. Qty: 30 tablet, Refills: 0      CONTINUE these medications which have CHANGED   Details  warfarin (COUMADIN) 5 MG tablet Take 1-1.5 tablets (5-7.5 mg total) by mouth daily. Take 5mg  on Saturday and Sunday and 7.5mg  on Monday- Friday Qty: 20 tablet, Refills: 0      CONTINUE these medications which have NOT CHANGED   Details  ALPRAZolam (XANAX) 0.5 MG tablet Take 0.5 mg by mouth daily as needed for anxiety.    chlorhexidine (PERIDEX) 0.12 % solution Use as directed 15 mLs in the mouth or throat 2 (two) times daily.    cinacalcet (SENSIPAR) 30 MG tablet Take 30 mg by mouth daily.    citalopram (CELEXA) 20 MG tablet Take 20 mg by mouth daily.    clonazePAM (KLONOPIN) 0.5 MG tablet Take 0.25 mg by mouth daily. Taken before dialysis    losartan (COZAAR) 50 MG tablet Take 50 mg by mouth daily.    metoprolol (LOPRESSOR) 100 MG tablet Take 50 mg by mouth 2 (two) times daily.    pantoprazole (PROTONIX) 20 MG tablet Take 20 mg by mouth daily.    sertraline (ZOLOFT) 100 MG tablet Take 100 mg by mouth daily.    sevelamer (RENAGEL) 800  MG tablet Take 800 mg by mouth 3 (three) times daily with meals.    traMADol (ULTRAM) 50 MG tablet Take by mouth every 8 (eight) hours as needed.    zolpidem (AMBIEN) 10 MG tablet Take 10 mg by mouth at bedtime as needed for sleep.         DISCHARGE INSTRUCTIONS:  Continue hemodialysis on Monday, Wednesday and Friday Follow-up with hemodialysis center tomorrow for dialysis and also to get PT/INR checked Activity as tolerated Diet renal Follow-up with primary care physician in a week Follow-up  with [facility] nephrology in1- 2 days   DIET:  Renal diet  DISCHARGE CONDITION:  Fair  ACTIVITY:  Activity as tolerated  OXYGEN:  Home Oxygen: No.   Oxygen Delivery: room air  DISCHARGE LOCATION:  home   If you experience worsening of your admission symptoms, develop shortness of breath, life threatening emergency, suicidal or homicidal thoughts you must seek medical attention immediately by calling 911 or calling your MD immediately  if symptoms less severe.  You Must read complete instructions/literature along with all the possible adverse reactions/side effects for all the Medicines you take and that have been prescribed to you. Take any new Medicines after you have completely understood and accpet all the possible adverse reactions/side effects.   Please note  You were cared for by a hospitalist during your hospital stay. If you have any questions about your discharge medications or the care you received while you were in the hospital after you are discharged, you can call the unit and asked to speak with the hospitalist on call if the hospitalist that took care of you is not available. Once you are discharged, your primary care physician will handle any further medical issues. Please note that NO REFILLS for any discharge medications will be authorized once you are discharged, as it is imperative that you return to your primary care physician (or establish a relationship with a primary care physician if you do not have one) for your aftercare needs so that they can reassess your need for medications and monitor your lab values.     Today  Chief Complaint  Patient presents with  . Shortness of Breath    began this am, history of CHF, arrives on BiPAP   Patient is resting comfortably. Denies any chest pain or shortness of breath. Feels fine and wants to go home  ROS:  CONSTITUTIONAL: Denies fevers, chills. Denies any fatigue, weakness.  EYES: Denies blurry vision, double vision,  eye pain. EARS, NOSE, THROAT: Denies tinnitus, ear pain, hearing loss. RESPIRATORY: Denies cough, wheeze, shortness of breath.  CARDIOVASCULAR: Denies chest pain, palpitations, edema.  GASTROINTESTINAL: Denies nausea, vomiting, diarrhea, abdominal pain. Denies bright red blood per rectum. GENITOURINARY: Denies dysuria, hematuria. ENDOCRINE: Denies nocturia or thyroid problems. HEMATOLOGIC AND LYMPHATIC: Denies easy bruising or bleeding. SKIN: Denies rash or lesion. MUSCULOSKELETAL: Denies pain in neck, back, shoulder, knees, hips or arthritic symptoms.  NEUROLOGIC: Denies paralysis, paresthesias.  PSYCHIATRIC: Denies anxiety or depressive symptoms.   VITAL SIGNS:  Blood pressure 119/86, pulse 80, temperature 98.6 F (37 C), temperature source Oral, resp. rate 19, height 5\' 6"  (1.676 m), weight 70.6 kg (155 lb 10.3 oz), SpO2 94 %.  I/O:   Intake/Output Summary (Last 24 hours) at 09/20/14 1159 Last data filed at 09/20/14 0400  Gross per 24 hour  Intake    363 ml  Output   2518 ml  Net  -2155 ml    PHYSICAL EXAMINATION:  GENERAL:  59  y.o.-year-old patient lying in the bed with no acute distress.  EYES: Pupils equal, round, reactive to light and accommodation. No scleral icterus. Extraocular muscles intact.  HEENT: Head atraumatic, normocephalic. Oropharynx and nasopharynx clear.  NECK:  Supple, no jugular venous distention. No thyroid enlargement, no tenderness.  LUNGS: Normal breath sounds bilaterally, no wheezing, rales,rhonchi or crepitation. No use of accessory muscles of respiration.  CARDIOVASCULAR: S1, S2 normal. No murmurs, rubs, or gallops.  ABDOMEN: Soft, non-tender, non-distended. Bowel sounds present. No organomegaly or mass.  EXTREMITIES: No pedal edema, cyanosis, or clubbing.  NEUROLOGIC: Cranial nerves II through XII are intact. Muscle strength 5/5 in all extremities. Sensation intact. Gait not checked.  PSYCHIATRIC: The patient is alert and oriented x 3.  SKIN: No  obvious rash, lesion, or ulcer.   DATA REVIEW:   CBC  Recent Labs Lab 09/20/14 0410  WBC 8.1  HGB 12.4  HCT 37.6  PLT 172    Chemistries   Recent Labs Lab 09/19/14 0913 09/20/14 0410  NA 137 138  K 4.7 4.2  CL 101 97*  CO2 20* 25  GLUCOSE 113* 98  BUN 88* 64*  CREATININE 9.51* 7.49*  CALCIUM 8.2* 9.0  AST 19  --   ALT 16  --   ALKPHOS 430*  --   BILITOT 0.6  --     Cardiac Enzymes  Recent Labs Lab 09/19/14 0913  TROPONINI <0.03    Microbiology Results  Results for orders placed or performed during the hospital encounter of 09/19/14  MRSA PCR Screening     Status: None   Collection Time: 09/19/14  1:35 PM  Result Value Ref Range Status   MRSA by PCR NEGATIVE NEGATIVE Final    Comment:        The GeneXpert MRSA Assay (FDA approved for NASAL specimens only), is one component of a comprehensive MRSA colonization surveillance program. It is not intended to diagnose MRSA infection nor to guide or monitor treatment for MRSA infections.     RADIOLOGY:  Dg Chest Port 1 View  09/19/2014   CLINICAL DATA:  Acute severe shortness of breath.  EXAM: PORTABLE CHEST - 1 VIEW  COMPARISON:  None.  FINDINGS: Upper limits normal heart size noted. Pulmonary vascular congestion and interstitial opacities are noted, likely interstitial edema.  There are is no evidence of pneumothorax or large pleural effusion.  A right central venous catheter is noted with tips overlying the mid and lower SVC.  No acute bony abnormalities are identified.  IMPRESSION: Pulmonary vascular congestion with interstitial opacities -likely interstitial edema.  Upper limits normal heart size.   Electronically Signed   By: Margarette Canada M.D.   On: 09/19/2014 09:39    EKG:   Orders placed or performed during the hospital encounter of 09/19/14  . EKG 12-Lead  . EKG 12-Lead      Management plans discussed with the patient, family and they are in agreement.  CODE STATUS:     Code Status  Orders        Start     Ordered   09/19/14 1147  Full code   Continuous     09/19/14 1147      TOTAL TIME TAKING CARE OF THIS PATIENT: 45 minutes.   Please cc to [facility] nephrology @[facility] @  on 09/20/2014 at 11:59 AM  Between 7am to 6pm - Pager - [phone]  After 6pm go to [email or web address] - password [email or web address] [facility]  [facility]  Office  [phone]  CC:  Primary care physician; Donnie Coffin, MD

## 2014-09-20 NOTE — Discharge Instructions (Signed)
Activity as tolerated Diet-renal diet Get PT/INR checked at outpatient dialysis center in a.m., and Coumadin management per nephrology/pcp Continue outpatient hemodialysis on Monday, Wednesday and Friday as recommended Follow-up with [facility] nephrology in 3 days Follow-up with primary care physician in a week

## 2014-09-20 NOTE — Progress Notes (Signed)
Subjective:   Patient presents via EMS for shortness of breath. She was placed on BiPAP.  Dialysis done yesterday. 2300 cc were removed Currently on room air    Objective:  Vital signs in last 24 hours:  Temp:  [97.4 F (36.3 C)-99.1 F (37.3 C)] 98.6 F (37 C) (09/13 0811) Pulse Rate:  [45-106] 85 (09/13 0811) Resp:  [15-33] 20 (09/13 0811) BP: (116-188)/(62-130) 142/84 mmHg (09/13 0600) SpO2:  [92 %-100 %] 94 % (09/13 0811) FiO2 (%):  [50 %] 50 % (09/12 0914) Weight:  [70.6 kg (155 lb 10.3 oz)-72.8 kg (160 lb 7.9 oz)] 70.6 kg (155 lb 10.3 oz) (09/12 1400)  Weight change:  Filed Weights   09/19/14 0920 09/19/14 1400  Weight: 72.8 kg (160 lb 7.9 oz) 70.6 kg (155 lb 10.3 oz)    Intake/Output:    Intake/Output Summary (Last 24 hours) at 09/20/14 0820 Last data filed at 09/20/14 0400  Gross per 24 hour  Intake    363 ml  Output   2518 ml  Net  -2155 ml     Physical Exam: General: Critically ill appearing, laying in bed  HEENT Anicteric, moist oral mucus membranes  Neck supple  Pulm/lungs Clear b/l, room air  CVS/Heart Regular, no rub  Abdomen:  Soft, non tender  Extremities: No peripheral edema  Neurologic: Alert, able to follow commands  Skin: No acute rashes  Access: Rt IJ PC       Basic Metabolic Panel:   Recent Labs Lab 09/19/14 0913 09/20/14 0410  NA 137 138  K 4.7 4.2  CL 101 97*  CO2 20* 25  GLUCOSE 113* 98  BUN 88* 64*  CREATININE 9.51* 7.49*  CALCIUM 8.2* 9.0     CBC:  Recent Labs Lab 09/19/14 0913 09/20/14 0410  WBC 12.5* 8.1  NEUTROABS 8.6*  --   HGB 13.3 12.4  HCT 42.2 37.6  MCV 88.4 86.5  PLT 268 172      Microbiology:  Recent Results (from the past 720 hour(s))  MRSA PCR Screening     Status: None   Collection Time: 09/19/14  1:35 PM  Result Value Ref Range Status   MRSA by PCR NEGATIVE NEGATIVE Final    Comment:        The GeneXpert MRSA Assay (FDA approved for NASAL specimens only), is one component of  a comprehensive MRSA colonization surveillance program. It is not intended to diagnose MRSA infection nor to guide or monitor treatment for MRSA infections.     Coagulation Studies: No results for input(s): LABPROT, INR in the last 72 hours.  Urinalysis: No results for input(s): COLORURINE, LABSPEC, PHURINE, GLUCOSEU, HGBUR, BILIRUBINUR, KETONESUR, PROTEINUR, UROBILINOGEN, NITRITE, LEUKOCYTESUR in the last 72 hours.  Invalid input(s): APPERANCEUR    Imaging: Dg Chest Port 1 View  09/19/2014   CLINICAL DATA:  Acute severe shortness of breath.  EXAM: PORTABLE CHEST - 1 VIEW  COMPARISON:  None.  FINDINGS: Upper limits normal heart size noted. Pulmonary vascular congestion and interstitial opacities are noted, likely interstitial edema.  There are is no evidence of pneumothorax or large pleural effusion.  A right central venous catheter is noted with tips overlying the mid and lower SVC.  No acute bony abnormalities are identified.  IMPRESSION: Pulmonary vascular congestion with interstitial opacities -likely interstitial edema.  Upper limits normal heart size.   Electronically Signed   By: Margarette Canada M.D.   On: 09/19/2014 09:39     Medications:     .  chlorhexidine  15 mL Mouth/Throat BID  . clonazePAM  0.25 mg Oral Daily  . heparin  3,000 Units Intracatheter Once in dialysis  . heparin  5,000 Units Subcutaneous 3 times per day  . losartan  50 mg Oral Daily  . nitroGLYCERIN  0.5 inch Topical 4 times per day  . sodium chloride  3 mL Intravenous Q12H   sodium chloride, acetaminophen **OR** acetaminophen, ALPRAZolam, heparin, hydrALAZINE, ondansetron **OR** ondansetron (ZOFRAN) IV, sodium chloride, traMADol, zolpidem  Assessment/ Plan:  59 y.o. female with end-stage renal disease, hypertension, cardiomyopathy, chronic systolic congestive heart failure with EF of [id number], grade 1 diastolic dysfuncion presents for shortness of breath and is diagnosed with pulmonary edema. Similar  presentation in   1. End-stage renal disease. [facility] nephrology. [employer]. M-W-F 2. Acute pulmonary edema requiring NIPPV 3. Anemia of chronic kidney disease 4. Secondary hyperparathyroidism  Plan: Urgent HD done yesterday. 2300 cc removed.  Currently on room air We discussed paying more attention to fluid and salt restriction Patient will start taking lasix daily She was given the option to ask for extra treatment as outpatient if she feels SOB is starting. OK to d/c home from renal standpoint   LOS: 1 Aidan Moten 9/13/20168:20 AM

## 2014-09-20 NOTE — Progress Notes (Signed)
Notified Dr. Margaretmary Eddy about patient elevated heart rhythm (sinus tachycardia). Patient was asleep the entire time and converted back to sinus rhythm. MD aware, still plans to discharge.

## 2014-09-20 NOTE — Progress Notes (Signed)
Patient discharged. Discharge instructions reviewed with patient, questions answered and prescriptions given. Doses of coumadin and lasix given before discharge per MD, with patient instructed to take regular doses starting tomorrow per MD.

## 2014-09-20 NOTE — Care Management Note (Signed)
Case Management Note  Patient Details  Name: Teresa Cook MRN: [id number] Date of Birth: March 23, 1955  Subjective/Objective: Admitted with acute respiratory failure secondary to CHF. Hx: ESRD, [address] [place] [facility] Maudie Mercury RiIddle with patient pathways notified of dialysis pt.  Attempted to see patient and she was sleeping                   Action/Plan:   Expected Discharge Date:                  Expected Discharge Plan:    In-House Referral:     Discharge planning Services     Post Acute Care Choice:    Choice offered to:     DME Arranged:    DME Agency:     HH Arranged:    [place] Agency:     Status of Service:  In process, will continue to follow  Medicare Important Message Given:    Date Medicare IM Given:    Medicare IM give by:    Date Additional Medicare IM Given:    Additional Medicare Important Message give by:     If discussed at [place] of Stay Meetings, dates discussed:    Additional Comments:  Jolly Mango, RN 09/20/2014, 9:07 AM

## 2014-09-27 ENCOUNTER — Encounter: Payer: Self-pay | Admitting: *Deleted

## 2014-10-19 ENCOUNTER — Emergency Department
Admission: EM | Admit: 2014-10-19 | Discharge: 2014-10-19 | Disposition: A | Discharge status: Home / Self Care | Payer: Medicare Other | Attending: Emergency Medicine | Admitting: Emergency Medicine

## 2014-10-19 ENCOUNTER — Emergency Department: Payer: Medicare Other

## 2014-10-19 ENCOUNTER — Encounter: Payer: Self-pay | Admitting: *Deleted

## 2014-10-19 DIAGNOSIS — R569 Unspecified convulsions: Secondary | ICD-10-CM | POA: Diagnosis present

## 2014-10-19 DIAGNOSIS — N186 End stage renal disease: Secondary | ICD-10-CM | POA: Insufficient documentation

## 2014-10-19 DIAGNOSIS — Z79899 Other long term (current) drug therapy: Secondary | ICD-10-CM | POA: Diagnosis not present

## 2014-10-19 DIAGNOSIS — I120 Hypertensive chronic kidney disease with stage 5 chronic kidney disease or end stage renal disease: Secondary | ICD-10-CM | POA: Insufficient documentation

## 2014-10-19 DIAGNOSIS — R51 Headache: Secondary | ICD-10-CM | POA: Diagnosis not present

## 2014-10-19 DIAGNOSIS — Z992 Dependence on renal dialysis: Secondary | ICD-10-CM | POA: Insufficient documentation

## 2014-10-19 DIAGNOSIS — R55 Syncope and collapse: Secondary | ICD-10-CM | POA: Diagnosis not present

## 2014-10-19 DIAGNOSIS — Z7901 Long term (current) use of anticoagulants: Secondary | ICD-10-CM | POA: Insufficient documentation

## 2014-10-19 LAB — CBC WITH DIFFERENTIAL/PLATELET
BASOS PCT: 1 %
Basophils Absolute: 0.1 10*3/uL (ref 0–0.1)
EOS ABS: 0.2 10*3/uL (ref 0–0.7)
EOS PCT: 2 %
HCT: 37.2 % (ref 35.0–47.0)
HEMOGLOBIN: 12.4 g/dL (ref 12.0–16.0)
Lymphocytes Relative: 40 %
Lymphs Abs: 4 10*3/uL — ABNORMAL HIGH (ref 1.0–3.6)
MCH: 28.9 pg (ref 26.0–34.0)
MCHC: 33.2 g/dL (ref 32.0–36.0)
MCV: 87.1 fL (ref 80.0–100.0)
MONOS PCT: 7 %
Monocytes Absolute: 0.7 10*3/uL (ref 0.2–0.9)
Neutro Abs: 5.1 10*3/uL (ref 1.4–6.5)
Neutrophils Relative %: 50 %
PLATELETS: 207 10*3/uL (ref 150–440)
RBC: 4.27 MIL/uL (ref 3.80–5.20)
RDW: 15.5 % — ABNORMAL HIGH (ref 11.5–14.5)
WBC: 10.1 10*3/uL (ref 3.6–11.0)

## 2014-10-19 LAB — BASIC METABOLIC PANEL
Anion gap: 21 — ABNORMAL HIGH (ref 5–15)
BUN: 27 mg/dL — AB (ref 6–20)
CALCIUM: 8.1 mg/dL — AB (ref 8.9–10.3)
CHLORIDE: 98 mmol/L — AB (ref 101–111)
CO2: 16 mmol/L — ABNORMAL LOW (ref 22–32)
CREATININE: 4.63 mg/dL — AB (ref 0.44–1.00)
GFR, EST AFRICAN AMERICAN: 11 mL/min — AB (ref >60)
GFR, EST NON AFRICAN AMERICAN: 9 mL/min — AB (ref >60)
Glucose, Bld: 87 mg/dL (ref 65–99)
Potassium: 4 mmol/L (ref 3.5–5.1)
SODIUM: 135 mmol/L (ref 135–145)

## 2014-10-19 NOTE — ED Provider Notes (Signed)
[facility] Emergency Department Provider Note   ____________________________________________  Time seen: 1520  I have reviewed the triage vital signs and the nursing notes.   HISTORY  Chief Complaint Seizures   History limited by: Not Limited   HPI Teresa Cook is a 59 y.o. female who presents to the emergency department today via EMS from dialysis center after a possible seizure-like episode. The patient states she had just finished dialysis when she stood up took one step and then blacked out. She does not recall any preceding or following chest pain, shortness breath or palpitations. The patient does state that frequently after dialysis she is dizzy and lightheaded. She does produce urine however was not incontinent of urine during this episode. Additionally she does not taste any bladder does not think she bit her tongue. She states that afterwards she did not have any fogginess or confusion with her thoughts. She does not have any history of seizures. The patient does state that she thinks she hit her head and now has a headache. She denies pain anywhere else in her body.   Past Medical History  Diagnosis Date  . Hypertension   . 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 DVT (deep venous thrombosis) ([facility])     left leg  . CHF (congestive heart failure) ([place])   . Dialysis patient ([place])   . HTN (hypertension)   . Parathyroid abnormality ([place])   . Depression   . Anxiety   . Psoriasis   . Gout   . DVT (deep venous thrombosis) ([place])   . Chronic kidney disease     Patient Active Problem List   Diagnosis Date Noted  . Acute respiratory failure ([place]) 09/19/2014  . Acute pulmonary edema ([place]) 08/30/2014  . Respiratory failure ([place]) 08/29/2014  . Respiratory difficulty 08/29/2014  . Chronic systolic heart failure ([place]) 05/16/2014  . HTN (hypertension) 05/16/2014  . Renal failure 05/16/2014  . Anxiety 05/16/2014    Past Surgical History  Procedure  Laterality Date  . Partial hysterectomy    . Abdominal hysterectomy    . Appendectomy      Current Outpatient Rx  Name  Route  Sig  Dispense  Refill  . ALPRAZolam (XANAX) 0.5 MG tablet   Oral   Take 0.5 mg by mouth daily as needed for anxiety.         . chlorhexidine (PERIDEX) 0.12 % solution   Mouth/Throat   Use as directed 15 mLs in the mouth or throat 2 (two) times daily.         . cinacalcet (SENSIPAR) 30 MG tablet   Oral   Take 30 mg by mouth daily.         . cinacalcet (SENSIPAR) 60 MG tablet   Oral   Take 60 mg by mouth daily.         . citalopram (CELEXA) 20 MG tablet   Oral   Take 20 mg by mouth daily.         . citalopram (CELEXA) 20 MG tablet   Oral   Take 20 mg by mouth daily.         . clonazePAM (KLONOPIN) 0.5 MG tablet   Oral   Take 0.25 mg by mouth daily. Taken before dialysis         . furosemide (LASIX) 20 MG tablet   Oral   Take 1 tablet (20 mg total) by mouth daily.   30 tablet   0   .  losartan (COZAAR) 50 MG tablet   Oral   Take 75 mg by mouth daily.          Marland Kitchen losartan (COZAAR) 50 MG tablet   Oral   Take 50 mg by mouth daily.         . metoprolol (LOPRESSOR) 100 MG tablet   Oral   Take 50 mg by mouth 2 (two) times daily.         . metoprolol (LOPRESSOR) 100 MG tablet   Oral   Take 50 mg by mouth 2 (two) times daily.         . pantoprazole (PROTONIX) 20 MG tablet   Oral   Take 20 mg by mouth daily.         . pantoprazole (PROTONIX) 40 MG tablet   Oral   Take 40 mg by mouth daily.          . sertraline (ZOLOFT) 100 MG tablet   Oral   Take 200 mg by mouth daily.         . sertraline (ZOLOFT) 100 MG tablet   Oral   Take 100 mg by mouth daily.         . sevelamer (RENAGEL) 800 MG tablet   Oral   Take 800 mg by mouth 3 (three) times daily with meals.         . sevelamer carbonate (RENVELA) 800 MG tablet   Oral   Take 800 mg by mouth 3 (three) times daily with meals.         . traMADol  (ULTRAM) 50 MG tablet   Oral   Take 50 mg by mouth every 8 (eight) hours as needed.         . traMADol (ULTRAM) 50 MG tablet   Oral   Take by mouth every 8 (eight) hours as needed.         . warfarin (COUMADIN) 5 MG tablet   Oral   Take 5-7.5 mg by mouth daily. Take 7.5mg  on Monday-Saturday & 5mg  on Sunday         . warfarin (COUMADIN) 5 MG tablet   Oral   Take 1-1.5 tablets (5-7.5 mg total) by mouth daily. Take 5mg  on Saturday and Sunday and 7.5mg  on Monday- Friday  Start from tomorrow   20 tablet   0   . zolpidem (AMBIEN) 10 MG tablet   Oral   Take 10 mg by mouth at bedtime as needed for sleep.         Marland Kitchen zolpidem (AMBIEN) 10 MG tablet   Oral   Take 10 mg by mouth at bedtime as needed for sleep.           Allergies Review of patient's allergies indicates no known allergies.  Family History  Problem Relation Age of Onset  . Stroke Mother   . Hypertension Father   . Hypertension Sister   . Diabetes Sister   . Stroke Brother   . Hypertension Brother   . CVA Mother   . Hypertension Mother     Social History Social History  Substance Use Topics  . Smoking status: Never Smoker   . Smokeless tobacco: Never Used  . Alcohol Use: No    Review of Systems  Constitutional: Negative for fever. Cardiovascular: Negative for chest pain. Respiratory: Negative for shortness of breath. Gastrointestinal: Negative for abdominal pain, vomiting and diarrhea. Genitourinary: Negative for dysuria. Musculoskeletal: Negative for back pain. Skin: Negative for rash. Neurological: Positive for headache  10-point ROS otherwise negative.  ____________________________________________   PHYSICAL EXAM:  VITAL SIGNS: ED Triage Vitals  Enc Vitals Group     BP 10/19/14 1458 133/75 mmHg     Pulse Rate 10/19/14 1458 101     Resp 10/19/14 1458 16     Temp 10/19/14 1458 98.3 F (36.8 C)     Temp Source 10/19/14 1458 Oral     SpO2 10/19/14 1458 99 %     Weight 10/19/14  1458 150 lb (68.04 kg)     Height 10/19/14 1458 5\' 6"  (1.676 m)   Constitutional: Alert and oriented. Well appearing and in no distress. Eyes: Conjunctivae are normal. PERRL. Normal extraocular movements. ENT   Head: Normocephalic and atraumatic.   Nose: No congestion/rhinnorhea.   Mouth/Throat: Mucous membranes are moist.   Neck: No stridor. Hematological/Lymphatic/Immunilogical: No cervical lymphadenopathy. Cardiovascular: Normal rate, regular rhythm.  No murmurs, rubs, or gallops. Respiratory: Normal respiratory effort without tachypnea nor retractions. Breath sounds are clear and equal bilaterally. No wheezes/rales/rhonchi. Gastrointestinal: Soft and nontender. No distention.  Genitourinary: Deferred Musculoskeletal: Normal range of motion in all extremities. No joint effusions.  No lower extremity tenderness nor edema. Neurologic:  Normal speech and language. No gross focal neurologic deficits are appreciated. Speech is normal.  Skin:  Skin is warm, dry and intact. No rash noted. Psychiatric: Mood and affect are normal. Speech and behavior are normal. Patient exhibits appropriate insight and judgment.  ____________________________________________    LABS (pertinent positives/negatives)  Labs Reviewed  CBC WITH DIFFERENTIAL/PLATELET - Abnormal; Notable for the following:    RDW 15.5 (*)    Lymphs Abs 4.0 (*)    All other components within normal limits  BASIC METABOLIC PANEL - Abnormal; Notable for the following:    Chloride 98 (*)    CO2 16 (*)    BUN 27 (*)    Creatinine, Ser 4.63 (*)    Calcium 8.1 (*)    GFR calc non Af Amer 9 (*)    GFR calc Af Amer 11 (*)    Anion gap 21 (*)    All other components within normal limits     ____________________________________________   EKG  I, Nance Pear, attending physician, personally viewed and interpreted this EKG  EKG Time: 1458 Rate: 98 Rhythm: Normal Sinus rhythm Axis: normal Intervals: qtc  497 QRS: narrow, LVH ST changes: repolarization abnormality in V1, V2  Impression: abnormal EKG   ____________________________________________    RADIOLOGY  CT head IMPRESSION: Slight periventricular small vessel disease without acute infarct. No hemorrhage. No extra-axial fluid collection or midline shift. The small partially calcified meningioma in the right posterior, superior tentorial region is essentially stable. No surrounding edema. There are bony changes suggesting chronic renal failure in the calvarium.   ____________________________________________   PROCEDURES  Procedure(s) performed: None  Critical Care performed: No  ____________________________________________   INITIAL IMPRESSION / ASSESSMENT AND PLAN / ED COURSE  Pertinent labs & imaging results that were available during my care of the patient were reviewed by me and considered in my medical decision making (see chart for details).  Patient presented to the emergency department today because of possible seizure episode after dialysis. I think that the clinical history is perhaps more consistent with a vasovagal syncopal episode given that it occurred right after the patient stood up. Head CT here without any acute findings. Blood work with normal sodium and potassium. Patient continued to do well during her stay here in the emergency department. No further episodes.  ____________________________________________   FINAL CLINICAL IMPRESSION(S) / ED DIAGNOSES  Final diagnoses:  Syncope, unspecified syncope type     Nance Pear, MD 10/19/14 1849

## 2014-10-19 NOTE — Discharge Instructions (Signed)
Please seek medical attention for any high fevers, chest pain, shortness of breath, change in behavior, persistent vomiting, bloody stool or any other new or concerning symptoms.   Syncope Syncope is a medical term for fainting or passing out. This means you lose consciousness and drop to the ground. People are generally unconscious for less than 5 minutes. You may have some muscle twitches for up to 15 seconds before waking up and returning to normal. Syncope occurs more often in older adults, but it can happen to anyone. While most causes of syncope are not dangerous, syncope can be a sign of a serious medical problem. It is important to seek medical care.  CAUSES  Syncope is caused by a sudden drop in blood flow to the brain. The specific cause is often not determined. Factors that can bring on syncope include:  Taking medicines that lower blood pressure.  Sudden changes in posture, such as standing up quickly.  Taking more medicine than prescribed.  Standing in one place for too long.  Seizure disorders.  Dehydration and excessive exposure to heat.  Low blood sugar (hypoglycemia).  Straining to have a bowel movement.  Heart disease, irregular heartbeat, or other circulatory problems.  Fear, emotional distress, seeing blood, or severe pain. SYMPTOMS  Right before fainting, you may:  Feel dizzy or light-headed.  Feel nauseous.  See all white or all black in your field of vision.  Have cold, clammy skin. DIAGNOSIS  Your health care provider will ask about your symptoms, perform a physical exam, and perform an electrocardiogram (ECG) to record the electrical activity of your heart. Your health care provider may also perform other heart or blood tests to determine the cause of your syncope which may include:  Transthoracic echocardiogram (TTE). During echocardiography, sound waves are used to evaluate how blood flows through your heart.  Transesophageal echocardiogram  (TEE).  Cardiac monitoring. This allows your health care provider to monitor your heart rate and rhythm in real time.  Holter monitor. This is a portable device that records your heartbeat and can help diagnose heart arrhythmias. It allows your health care provider to track your heart activity for several days, if needed.  Stress tests by exercise or by giving medicine that makes the heart beat faster. TREATMENT  In most cases, no treatment is needed. Depending on the cause of your syncope, your health care provider may recommend changing or stopping some of your medicines. HOME CARE INSTRUCTIONS  Have someone stay with you until you feel stable.  Do not drive, use machinery, or play sports until your health care provider says it is okay.  Keep all follow-up appointments as directed by your health care provider.  Lie down right away if you start feeling like you might faint. Breathe deeply and steadily. Wait until all the symptoms have passed.  Drink enough fluids to keep your urine clear or pale yellow.  If you are taking blood pressure or heart medicine, get up slowly and take several minutes to sit and then stand. This can reduce dizziness. SEEK IMMEDIATE MEDICAL CARE IF:   You have a severe headache.  You have unusual pain in the chest, abdomen, or back.  You are bleeding from your mouth or rectum, or you have black or tarry stool.  You have an irregular or very fast heartbeat.  You have pain with breathing.  You have repeated fainting or seizure-like jerking during an episode.  You faint when sitting or lying down.  You have confusion.  You have trouble walking.  You have severe weakness.  You have vision problems. If you fainted, call your local emergency services (911 in [place] Do not drive yourself to the hospital.    This information is not intended to replace advice given to you by your health care provider. Make sure you discuss any questions you have with  your health care provider.   Document Released: 12/24/2004 Document Revised: 05/10/2014 Document Reviewed: 02/22/2011 Elsevier Interactive Patient Education [employer].

## 2014-10-19 NOTE — ED Notes (Signed)
Pt completed hemodialysis today, per staff, pt had a seizure.  Pt A&O at this time., EMS established PIV.

## 2014-11-11 DIAGNOSIS — IMO0001 Reserved for inherently not codable concepts without codable children: Secondary | ICD-10-CM | POA: Insufficient documentation

## 2014-11-11 DIAGNOSIS — R6889 Other general symptoms and signs: Secondary | ICD-10-CM

## 2014-11-28 ENCOUNTER — Emergency Department: Payer: Medicare Other

## 2014-11-28 ENCOUNTER — Encounter: Payer: Self-pay | Admitting: Medical Oncology

## 2014-11-28 ENCOUNTER — Emergency Department
Admission: EM | Admit: 2014-11-28 | Discharge: 2014-11-28 | Disposition: A | Discharge status: Home / Self Care | Payer: Medicare Other | Attending: Emergency Medicine | Admitting: Emergency Medicine

## 2014-11-28 DIAGNOSIS — Z79899 Other long term (current) drug therapy: Secondary | ICD-10-CM | POA: Diagnosis not present

## 2014-11-28 DIAGNOSIS — E119 Type 2 diabetes mellitus without complications: Secondary | ICD-10-CM | POA: Diagnosis not present

## 2014-11-28 DIAGNOSIS — N186 End stage renal disease: Secondary | ICD-10-CM | POA: Insufficient documentation

## 2014-11-28 DIAGNOSIS — R600 Localized edema: Secondary | ICD-10-CM

## 2014-11-28 DIAGNOSIS — F419 Anxiety disorder, unspecified: Secondary | ICD-10-CM | POA: Diagnosis not present

## 2014-11-28 DIAGNOSIS — Z7901 Long term (current) use of anticoagulants: Secondary | ICD-10-CM | POA: Insufficient documentation

## 2014-11-28 DIAGNOSIS — L02211 Cutaneous abscess of abdominal wall: Secondary | ICD-10-CM

## 2014-11-28 DIAGNOSIS — E785 Hyperlipidemia, unspecified: Secondary | ICD-10-CM | POA: Insufficient documentation

## 2014-11-28 DIAGNOSIS — F329 Major depressive disorder, single episode, unspecified: Secondary | ICD-10-CM | POA: Diagnosis not present

## 2014-11-28 DIAGNOSIS — I120 Hypertensive chronic kidney disease with stage 5 chronic kidney disease or end stage renal disease: Secondary | ICD-10-CM | POA: Diagnosis not present

## 2014-11-28 DIAGNOSIS — Z992 Dependence on renal dialysis: Secondary | ICD-10-CM | POA: Insufficient documentation

## 2014-11-28 LAB — CBC WITH DIFFERENTIAL/PLATELET
BASOS ABS: 0 10*3/uL (ref 0–0.1)
Basophils Relative: 0 %
Eosinophils Absolute: 0.1 10*3/uL (ref 0–0.7)
Eosinophils Relative: 1 %
HEMATOCRIT: 33 % — AB (ref 35.0–47.0)
Hemoglobin: 11 g/dL — ABNORMAL LOW (ref 12.0–16.0)
LYMPHS ABS: 1.4 10*3/uL (ref 1.0–3.6)
LYMPHS PCT: 14 %
MCH: 29.5 pg (ref 26.0–34.0)
MCHC: 33.4 g/dL (ref 32.0–36.0)
MCV: 88.2 fL (ref 80.0–100.0)
MONO ABS: 0.9 10*3/uL (ref 0.2–0.9)
Monocytes Relative: 9 %
NEUTROS ABS: 8.1 10*3/uL — AB (ref 1.4–6.5)
Neutrophils Relative %: 76 %
Platelets: 182 10*3/uL (ref 150–440)
RBC: 3.75 MIL/uL — AB (ref 3.80–5.20)
RDW: 16.8 % — AB (ref 11.5–14.5)
WBC: 10.7 10*3/uL (ref 3.6–11.0)

## 2014-11-28 LAB — BASIC METABOLIC PANEL
ANION GAP: 13 (ref 5–15)
BUN: 37 mg/dL — AB (ref 6–20)
CHLORIDE: 97 mmol/L — AB (ref 101–111)
CO2: 23 mmol/L (ref 22–32)
Calcium: 7.9 mg/dL — ABNORMAL LOW (ref 8.9–10.3)
Creatinine, Ser: 7.89 mg/dL — ABNORMAL HIGH (ref 0.44–1.00)
GFR calc Af Amer: 6 mL/min — ABNORMAL LOW (ref >60)
GFR, EST NON AFRICAN AMERICAN: 5 mL/min — AB (ref >60)
GLUCOSE: 101 mg/dL — AB (ref 65–99)
POTASSIUM: 4.3 mmol/L (ref 3.5–5.1)
Sodium: 133 mmol/L — ABNORMAL LOW (ref 135–145)

## 2014-11-28 MED ORDER — HYDROMORPHONE HCL 1 MG/ML IJ SOLN
1.0000 mg | Freq: Once | INTRAMUSCULAR | Status: AC
  Administered 2014-11-28: 1 mg via INTRAVENOUS

## 2014-11-28 MED ORDER — OXYCODONE-ACETAMINOPHEN 5-325 MG PO TABS
1.0000 | ORAL_TABLET | Freq: Once | ORAL | Status: AC
  Administered 2014-11-28: 1 via ORAL
  Filled 2014-11-28: qty 1

## 2014-11-28 MED ORDER — OXYCODONE-ACETAMINOPHEN 7.5-325 MG PO TABS: 1.0000 | ORAL_TABLET | ORAL | Status: DC | PRN

## 2014-11-28 MED ORDER — CLINDAMYCIN PHOSPHATE 600 MG/50ML IV SOLN
600.0000 mg | Freq: Once | INTRAVENOUS | Status: AC
  Administered 2014-11-28: 600 mg via INTRAVENOUS
  Filled 2014-11-28: qty 50

## 2014-11-28 MED ORDER — ONDANSETRON HCL 4 MG/2ML IJ SOLN
4.0000 mg | Freq: Once | INTRAMUSCULAR | Status: AC
  Administered 2014-11-28: 4 mg via INTRAVENOUS
  Filled 2014-11-28: qty 2

## 2014-11-28 MED ORDER — HYDROMORPHONE HCL 1 MG/ML IJ SOLN
1.0000 mg | Freq: Once | INTRAMUSCULAR | Status: DC
  Filled 2014-11-28: qty 1

## 2014-11-28 MED ORDER — CLINDAMYCIN HCL 150 MG PO CAPS: 150.0000 mg | ORAL_CAPSULE | Freq: Four times a day (QID) | ORAL | Status: DC

## 2014-11-28 NOTE — ED Provider Notes (Signed)
[facility] Emergency Department Provider Note  ____________________________________________  Time seen: Approximately 5:21 PM  I have reviewed the triage vital signs and the nursing notes.   HISTORY  Chief Complaint Abscess  Teresa Cook is a 59 y.o. female Patient sent over from [facility] secondary to abdominal abscess. Patient noticed increasing edema to the left lower abdomen for1 week.She denies any fever or discharge. Patient is rating her pain as a 10 over 10. No positive measures taken prior to arrival. HPI   Past Medical History  Diagnosis Date  . Hypertension   . 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 DVT (deep venous thrombosis) ([facility])     left leg  . CHF (congestive heart failure) ([place])   . Dialysis patient ([place])   . HTN (hypertension)   . Parathyroid abnormality ([place])   . Depression   . Anxiety   . Psoriasis   . Gout   . DVT (deep venous thrombosis) ([place])   . Chronic kidney disease     Patient Active Problem List   Diagnosis Date Noted  . Acute respiratory failure ([place]) 09/19/2014  . Acute pulmonary edema ([employer]) 08/30/2014  . Respiratory failure ([place]) 08/29/2014  . Respiratory difficulty 08/29/2014  . Chronic systolic heart failure ([place]) 05/16/2014  . HTN (hypertension) 05/16/2014  . Renal failure 05/16/2014  . Anxiety 05/16/2014    Past Surgical History  Procedure Laterality Date  . Partial hysterectomy    . Abdominal hysterectomy    . Appendectomy      Current Outpatient Rx  Name  Route  Sig  Dispense  Refill  . ALPRAZolam (XANAX) 0.5 MG tablet   Oral   Take 0.5 mg by mouth daily as needed for anxiety.         . chlorhexidine (PERIDEX) 0.12 % solution   Mouth/Throat   Use as directed 15 mLs in the mouth or throat 2 (two) times daily.         . cinacalcet (SENSIPAR) 30 MG tablet   Oral   Take 30 mg by mouth daily.         . cinacalcet (SENSIPAR) 60 MG tablet   Oral   Take 60 mg by mouth daily.         . citalopram (CELEXA) 20 MG tablet   Oral   Take 20 mg by mouth daily.         . citalopram (CELEXA) 20 MG tablet   Oral   Take 20 mg by mouth daily.         . clonazePAM (KLONOPIN) 0.5 MG tablet   Oral   Take 0.25 mg by mouth daily. Taken before dialysis         . furosemide (LASIX) 20 MG tablet   Oral   Take 1 tablet (20 mg total) by mouth daily.   30 tablet   0   . losartan (COZAAR) 50 MG tablet   Oral   Take 75 mg by mouth daily.          Marland Kitchen losartan (COZAAR) 50 MG tablet   Oral   Take 50 mg by mouth daily.         . metoprolol (LOPRESSOR) 100 MG tablet   Oral   Take 50 mg by mouth 2 (two) times daily.         . metoprolol (LOPRESSOR) 100 MG tablet   Oral   Take 50 mg by mouth 2 (two) times daily.         Marland Kitchen  pantoprazole (PROTONIX) 20 MG tablet   Oral   Take 20 mg by mouth daily.         . pantoprazole (PROTONIX) 40 MG tablet   Oral   Take 40 mg by mouth daily.          . sertraline (ZOLOFT) 100 MG tablet   Oral   Take 200 mg by mouth daily.         . sertraline (ZOLOFT) 100 MG tablet   Oral   Take 100 mg by mouth daily.         . sevelamer (RENAGEL) 800 MG tablet   Oral   Take 800 mg by mouth 3 (three) times daily with meals.         . sevelamer carbonate (RENVELA) 800 MG tablet   Oral   Take 800 mg by mouth 3 (three) times daily with meals.         . traMADol (ULTRAM) 50 MG tablet   Oral   Take 50 mg by mouth every 8 (eight) hours as needed.         . traMADol (ULTRAM) 50 MG tablet   Oral   Take by mouth every 8 (eight) hours as needed.         . warfarin (COUMADIN) 5 MG tablet   Oral   Take 5-7.5 mg by mouth daily. Take 7.5mg  on Monday-Saturday & 5mg  on Sunday         . warfarin (COUMADIN) 5 MG tablet   Oral   Take 1-1.5 tablets (5-7.5 mg total) by mouth daily. Take 5mg  on Saturday and Sunday and 7.5mg  on Monday- Friday  Start from tomorrow   20 tablet   0   . zolpidem (AMBIEN) 10 MG tablet    Oral   Take 10 mg by mouth at bedtime as needed for sleep.         Marland Kitchen zolpidem (AMBIEN) 10 MG tablet   Oral   Take 10 mg by mouth at bedtime as needed for sleep.           Allergies Review of patient's allergies indicates no known allergies.  Family History  Problem Relation Age of Onset  . Stroke Mother   . Hypertension Father   . Hypertension Sister   . Diabetes Sister   . Stroke Brother   . Hypertension Brother   . CVA Mother   . Hypertension Mother     Social History Social History  Substance Use Topics  . Smoking status: Never Smoker   . Smokeless tobacco: Never Used  . Alcohol Use: No    Review of Systems Constitutional: No fever/chills Eyes: No visual changes. ENT: No sore throat. Cardiovascular: Denies chest pain. Respiratory: Denies shortness of breath. Gastrointestinal: No abdominal pain.  No nausea, no vomiting.  No diarrhea.  No constipation. Genitourinary: Negative for dysuria. Musculoskeletal: Negative for back pain. Skin: Negative for rash. Neurological: Negative for headaches, focal weakness or numbness. Psychiatric:Depression and anxiety. Endocrine:Hypertension, hyperlipidemia,diabetes, and chronic kidney disease disease 10-point ROS otherwise negative.  ____________________________________________   PHYSICAL EXAM:  VITAL SIGNS: ED Triage Vitals  Enc Vitals Group     BP 11/28/14 1649 145/69 mmHg     Pulse Rate 11/28/14 1649 117     Resp 11/28/14 1649 18     Temp 11/28/14 1649 99.1 F (37.3 C)     Temp Source 11/28/14 1649 Oral     SpO2 11/28/14 1649 97 %     Weight 11/28/14 1649  153 lb (69.4 kg)     Height 11/28/14 1649 5\' 6"  (1.676 m)     Head Cir --      Peak Flow --      Pain Score 11/28/14 1656 10     Pain Loc --      Pain Edu? --      Excl. in [place]? --     Constitutional: Alert and oriented. Well appearing and in no acute distress. Eyes: Conjunctivae are normal. PERRL. EOMI. Head: Atraumatic. Nose: No  congestion/rhinnorhea. Mouth/Throat: Mucous membranes are moist.  Oropharynx non-erythematous. Neck: No stridor. No cervical spine tenderness to palpation. Hematological/Lymphatic/Immunilogical: No cervical lymphadenopathy. Cardiovascular: Normal rate, regular rhythm. Grossly normal heart sounds.  Good peripheral circulation. Respiratory: Normal respiratory effort.  No retractions. Lungs CTAB. Gastrointestinal: Soft and nontender. No distention. No abdominal bruits. No CVA tenderness. Musculoskeletal: No lower extremity tenderness nor edema.  No joint effusions. Neurologic:  Normal speech and language. No gross focal neurologic deficits are appreciated. No gait instability. Skin:  Skin is warm, dry and intact. No rash noted. Psychiatric: Mood and affect are normal. Speech and behavior are normal.  ____________________________________________   LABS (all labs ordered are listed, but only abnormal results are displayed)  Labs Reviewed  BASIC METABOLIC PANEL - Abnormal; Notable for the following:    Sodium 133 (*)    Chloride 97 (*)    Glucose, Bld 101 (*)    BUN 37 (*)    Creatinine, Ser 7.89 (*)    Calcium 7.9 (*)    GFR calc non Af Amer 5 (*)    GFR calc Af Amer 6 (*)    All other components within normal limits  CBC WITH DIFFERENTIAL/PLATELET - Abnormal; Notable for the following:    RBC 3.75 (*)    Hemoglobin 11.0 (*)    HCT 33.0 (*)    RDW 16.8 (*)    Neutro Abs 8.1 (*)    All other components within normal limits   ____________________________________________  EKG   ____________________________________________  RADIOLOGY  Ultrasound showed developing abscess measures 2.3 cm in subcutaneous tissue and anterior abdominal wall. ____________________________________________   PROCEDURES  Procedure(s) performed: None  Critical Care performed: No  ____________________________________________   INITIAL IMPRESSION / ASSESSMENT AND PLAN / ED COURSE  Pertinent  labs & imaging results that were available during my care of the patient were reviewed by me and considered in my medical decision making (see chart for details).  Discussed ultrasound findings with patient. Discussed with [place] who exam Patient with portable Ultra Sound. It was decided area not fluctuant and I&D will be deferred. Patient given IV antibiotics of clindamycin 600 mg IV and will be discharged with oral clindamycin and Percocets. Patient advised she may follow-up with her dialysis in the morning and to contact her PCP for reevaluation. Patient advised return by ER condition worsens. ____________________________________________   FINAL CLINICAL IMPRESSION(S) / ED DIAGNOSES  Final diagnoses:  Edema of abdominal wall  Abscess of abdominal wall      Sable Feil, PA-C 11/28/14 2031  I examined the patient at the bedside and confirmed there was no fluctuance. There was no pus drainage. No abscesses come to head. I also did a bedside ultrasound and it appeared there may be a small pocket but about a quarter inch deep to the skin. I agree with the plan of PA Tamala Julian for 1 dose of IV antibiotics and then by mouth antibiotics at home. Weeks for this plan to the  patient as well as her husband and they understand and are willing to comply.  Orbie Pyo, MD 11/28/14 [phone]

## 2014-11-28 NOTE — ED Notes (Signed)
States she developed an red ,raised area to left lower bad about 1 week ago  Area is more painful and unable to bear wt

## 2014-11-28 NOTE — Discharge Instructions (Signed)
Abscess °An abscess (boil or furuncle) is an infected area on or under the skin. This area is filled with yellowish-white fluid (pus) and other material (debris). °HOME CARE  °· Only take medicines as told by your doctor. °· If you were given antibiotic medicine, take it as directed. Finish the medicine even if you start to feel better. °· If gauze is used, follow your doctor's directions for changing the gauze. °· To avoid spreading the infection: °¨ Keep your abscess covered with a bandage. °¨ Wash your hands well. °¨ Do not share personal care items, towels, or whirlpools with others. °¨ Avoid skin contact with others. °· Keep your skin and clothes clean around the abscess. °· Keep all doctor visits as told. °GET HELP RIGHT AWAY IF:  °· You have more pain, puffiness (swelling), or redness in the wound site. °· You have more fluid or blood coming from the wound site. °· You have muscle aches, chills, or you feel sick. °· You have a fever. °MAKE SURE YOU:  °· Understand these instructions. °· Will watch your condition. °· Will get help right away if you are not doing well or get worse. °  °This information is not intended to replace advice given to you by your health care provider. Make sure you discuss any questions you have with your health care provider. °  °Document Released: 06/12/2007 Document Revised: 06/25/2011 Document Reviewed: 03/09/2011 °Elsevier Interactive Patient Education ©2016 [employer] ° °

## 2014-11-28 NOTE — ED Notes (Signed)
Pt reports that she has an abscess to left lower abdominal fold x 1 week. Denies fever.

## 2014-11-29 LAB — GLUCOSE, CAPILLARY: GLUCOSE-CAPILLARY: 96 mg/dL (ref 65–99)

## 2015-01-08 HISTORY — PX: REMOVAL OF A DIALYSIS CATHETER: SHX6053

## 2015-03-03 DIAGNOSIS — R42 Dizziness and giddiness: Secondary | ICD-10-CM | POA: Insufficient documentation

## 2015-03-03 DIAGNOSIS — R569 Unspecified convulsions: Secondary | ICD-10-CM | POA: Insufficient documentation

## 2015-06-16 ENCOUNTER — Other Ambulatory Visit: Payer: Self-pay | Admitting: Family Medicine

## 2015-06-16 DIAGNOSIS — Z1239 Encounter for other screening for malignant neoplasm of breast: Secondary | ICD-10-CM

## 2015-07-09 ENCOUNTER — Emergency Department: Payer: Medicare Other

## 2015-07-09 ENCOUNTER — Encounter: Payer: Self-pay | Admitting: Emergency Medicine

## 2015-07-09 ENCOUNTER — Inpatient Hospital Stay
Admission: EM | Admit: 2015-07-09 | Discharge: 2015-07-12 | DRG: 291 | Disposition: A | Discharge status: Home / Self Care | Payer: Medicare Other | Attending: Internal Medicine | Admitting: Internal Medicine

## 2015-07-09 DIAGNOSIS — I161 Hypertensive emergency: Secondary | ICD-10-CM | POA: Diagnosis not present

## 2015-07-09 DIAGNOSIS — I160 Hypertensive urgency: Secondary | ICD-10-CM | POA: Diagnosis present

## 2015-07-09 DIAGNOSIS — Z7901 Long term (current) use of anticoagulants: Secondary | ICD-10-CM

## 2015-07-09 DIAGNOSIS — D631 Anemia in chronic kidney disease: Secondary | ICD-10-CM | POA: Diagnosis present

## 2015-07-09 DIAGNOSIS — D72829 Elevated white blood cell count, unspecified: Secondary | ICD-10-CM | POA: Diagnosis present

## 2015-07-09 DIAGNOSIS — Z992 Dependence on renal dialysis: Secondary | ICD-10-CM | POA: Diagnosis not present

## 2015-07-09 DIAGNOSIS — Z79899 Other long term (current) drug therapy: Secondary | ICD-10-CM

## 2015-07-09 DIAGNOSIS — J810 Acute pulmonary edema: Secondary | ICD-10-CM

## 2015-07-09 DIAGNOSIS — I132 Hypertensive heart and chronic kidney disease with heart failure and with stage 5 chronic kidney disease, or end stage renal disease: Principal | ICD-10-CM | POA: Diagnosis present

## 2015-07-09 DIAGNOSIS — R0902 Hypoxemia: Secondary | ICD-10-CM | POA: Diagnosis present

## 2015-07-09 DIAGNOSIS — Z9049 Acquired absence of other specified parts of digestive tract: Secondary | ICD-10-CM

## 2015-07-09 DIAGNOSIS — I10 Essential (primary) hypertension: Secondary | ICD-10-CM | POA: Diagnosis not present

## 2015-07-09 DIAGNOSIS — R7989 Other specified abnormal findings of blood chemistry: Secondary | ICD-10-CM | POA: Diagnosis not present

## 2015-07-09 DIAGNOSIS — D696 Thrombocytopenia, unspecified: Secondary | ICD-10-CM | POA: Diagnosis present

## 2015-07-09 DIAGNOSIS — M109 Gout, unspecified: Secondary | ICD-10-CM | POA: Diagnosis present

## 2015-07-09 DIAGNOSIS — I5023 Acute on chronic systolic (congestive) heart failure: Secondary | ICD-10-CM | POA: Diagnosis present

## 2015-07-09 DIAGNOSIS — N2581 Secondary hyperparathyroidism of renal origin: Secondary | ICD-10-CM | POA: Diagnosis present

## 2015-07-09 DIAGNOSIS — Z8249 Family history of ischemic heart disease and other diseases of the circulatory system: Secondary | ICD-10-CM

## 2015-07-09 DIAGNOSIS — Z90710 Acquired absence of both cervix and uterus: Secondary | ICD-10-CM | POA: Diagnosis not present

## 2015-07-09 DIAGNOSIS — I248 Other forms of acute ischemic heart disease: Secondary | ICD-10-CM | POA: Diagnosis present

## 2015-07-09 DIAGNOSIS — I429 Cardiomyopathy, unspecified: Secondary | ICD-10-CM | POA: Diagnosis present

## 2015-07-09 DIAGNOSIS — Z86718 Personal history of other venous thrombosis and embolism: Secondary | ICD-10-CM | POA: Diagnosis not present

## 2015-07-09 DIAGNOSIS — Z833 Family history of diabetes mellitus: Secondary | ICD-10-CM | POA: Diagnosis not present

## 2015-07-09 DIAGNOSIS — N186 End stage renal disease: Secondary | ICD-10-CM | POA: Diagnosis present

## 2015-07-09 DIAGNOSIS — J9600 Acute respiratory failure, unspecified whether with hypoxia or hypercapnia: Secondary | ICD-10-CM

## 2015-07-09 DIAGNOSIS — Z823 Family history of stroke: Secondary | ICD-10-CM | POA: Diagnosis not present

## 2015-07-09 DIAGNOSIS — J811 Chronic pulmonary edema: Secondary | ICD-10-CM | POA: Diagnosis present

## 2015-07-09 LAB — CBC
HEMATOCRIT: 36.9 % (ref 35.0–47.0)
HEMOGLOBIN: 12.3 g/dL (ref 12.0–16.0)
MCH: 28.5 pg (ref 26.0–34.0)
MCHC: 33.3 g/dL (ref 32.0–36.0)
MCV: 85.6 fL (ref 80.0–100.0)
Platelets: 213 10*3/uL (ref 150–440)
RBC: 4.31 MIL/uL (ref 3.80–5.20)
RDW: 15.8 % — AB (ref 11.5–14.5)
WBC: 14.2 10*3/uL — ABNORMAL HIGH (ref 3.6–11.0)

## 2015-07-09 LAB — BASIC METABOLIC PANEL
ANION GAP: 12 (ref 5–15)
Anion gap: 11 (ref 5–15)
BUN: 51 mg/dL — ABNORMAL HIGH (ref 6–20)
BUN: 38 mg/dL — AB (ref 6–20)
CALCIUM: 8 mg/dL — AB (ref 8.9–10.3)
CHLORIDE: 105 mmol/L (ref 101–111)
CHLORIDE: 101 mmol/L (ref 101–111)
CO2: 23 mmol/L (ref 22–32)
CO2: 26 mmol/L (ref 22–32)
CREATININE: 6.71 mg/dL — AB (ref 0.44–1.00)
Calcium: 8.2 mg/dL — ABNORMAL LOW (ref 8.9–10.3)
Creatinine, Ser: 9.15 mg/dL — ABNORMAL HIGH (ref 0.44–1.00)
GFR calc Af Amer: 7 mL/min — ABNORMAL LOW (ref >60)
GFR calc non Af Amer: 6 mL/min — ABNORMAL LOW (ref >60)
GFR, EST AFRICAN AMERICAN: 5 mL/min — AB (ref >60)
GFR, EST NON AFRICAN AMERICAN: 4 mL/min — AB (ref >60)
Glucose, Bld: 122 mg/dL — ABNORMAL HIGH (ref 65–99)
Glucose, Bld: 143 mg/dL — ABNORMAL HIGH (ref 65–99)
POTASSIUM: 4.9 mmol/L (ref 3.5–5.1)
Potassium: 4.6 mmol/L (ref 3.5–5.1)
SODIUM: 140 mmol/L (ref 135–145)
Sodium: 138 mmol/L (ref 135–145)

## 2015-07-09 LAB — CREATININE, SERUM
Creatinine, Ser: 9.28 mg/dL — ABNORMAL HIGH (ref 0.44–1.00)
GFR calc non Af Amer: 4 mL/min — ABNORMAL LOW (ref >60)
GFR, EST AFRICAN AMERICAN: 5 mL/min — AB (ref >60)

## 2015-07-09 LAB — CBC WITH DIFFERENTIAL/PLATELET
BASOS ABS: 0.1 10*3/uL (ref 0–0.1)
Basophils Relative: 1 %
EOS ABS: 0.3 10*3/uL (ref 0–0.7)
EOS PCT: 2 %
HCT: 36.2 % (ref 35.0–47.0)
HEMOGLOBIN: 11.8 g/dL — AB (ref 12.0–16.0)
Lymphocytes Relative: 30 %
Lymphs Abs: 4 10*3/uL — ABNORMAL HIGH (ref 1.0–3.6)
MCH: 28.4 pg (ref 26.0–34.0)
MCHC: 32.6 g/dL (ref 32.0–36.0)
MCV: 86.9 fL (ref 80.0–100.0)
Monocytes Absolute: 0.6 10*3/uL (ref 0.2–0.9)
Monocytes Relative: 5 %
NEUTROS PCT: 62 %
Neutro Abs: 8.3 10*3/uL — ABNORMAL HIGH (ref 1.4–6.5)
PLATELETS: 226 10*3/uL (ref 150–440)
RBC: 4.17 MIL/uL (ref 3.80–5.20)
RDW: 15.9 % — ABNORMAL HIGH (ref 11.5–14.5)
WBC: 13.4 10*3/uL — AB (ref 3.6–11.0)

## 2015-07-09 LAB — GLUCOSE, CAPILLARY
GLUCOSE-CAPILLARY: 126 mg/dL — AB (ref 65–99)
GLUCOSE-CAPILLARY: 88 mg/dL (ref 65–99)
Glucose-Capillary: 71 mg/dL (ref 65–99)

## 2015-07-09 LAB — MAGNESIUM: Magnesium: 1.9 mg/dL (ref 1.7–2.4)

## 2015-07-09 LAB — MRSA PCR SCREENING: MRSA by PCR: NEGATIVE

## 2015-07-09 LAB — PHOSPHORUS: PHOSPHORUS: 4.5 mg/dL (ref 2.5–4.6)

## 2015-07-09 LAB — LACTIC ACID, PLASMA: LACTIC ACID, VENOUS: 1.1 mmol/L (ref 0.5–1.9)

## 2015-07-09 LAB — TROPONIN I
TROPONIN I: 0.04 ng/mL — AB (ref <0.03)
Troponin I: 0.22 ng/mL (ref <0.03)

## 2015-07-09 LAB — PROTIME-INR
INR: 1.96
PROTHROMBIN TIME: 22.2 s — AB (ref 11.4–15.0)

## 2015-07-09 MED ORDER — HEPARIN SODIUM (PORCINE) 5000 UNIT/ML IJ SOLN
5000.0000 [IU] | Freq: Two times a day (BID) | INTRAMUSCULAR | Status: DC
  Filled 2015-07-09: qty 1

## 2015-07-09 MED ORDER — CHLORHEXIDINE GLUCONATE 0.12 % MT SOLN
15.0000 mL | Freq: Two times a day (BID) | OROMUCOSAL | Status: DC
  Administered 2015-07-09: 15 mL via OROMUCOSAL
  Filled 2015-07-09: qty 15

## 2015-07-09 MED ORDER — FUROSEMIDE 10 MG/ML IJ SOLN
20.0000 mg | Freq: Once | INTRAMUSCULAR | Status: AC
  Administered 2015-07-09: 20 mg via INTRAVENOUS

## 2015-07-09 MED ORDER — FUROSEMIDE 10 MG/ML IJ SOLN
INTRAMUSCULAR | Status: AC
  Administered 2015-07-09: 80 mg via INTRAVENOUS
  Filled 2015-07-09: qty 10

## 2015-07-09 MED ORDER — WARFARIN SODIUM 5 MG PO TABS
7.5000 mg | ORAL_TABLET | ORAL | Status: DC
  Administered 2015-07-10: 7.5 mg via ORAL
  Filled 2015-07-09: qty 1

## 2015-07-09 MED ORDER — LOSARTAN POTASSIUM 50 MG PO TABS
50.0000 mg | ORAL_TABLET | Freq: Every day | ORAL | Status: DC
  Administered 2015-07-11: 50 mg via ORAL
  Filled 2015-07-09 (×2): qty 1

## 2015-07-09 MED ORDER — ACETAMINOPHEN 325 MG PO TABS
650.0000 mg | ORAL_TABLET | Freq: Four times a day (QID) | ORAL | Status: DC | PRN
  Administered 2015-07-09 – 2015-07-10 (×2): 650 mg via ORAL
  Filled 2015-07-09: qty 2

## 2015-07-09 MED ORDER — CLONIDINE HCL 0.1 MG PO TABS: 0.1000 mg | ORAL_TABLET | Freq: Four times a day (QID) | ORAL | Status: DC | PRN

## 2015-07-09 MED ORDER — FAMOTIDINE IN NACL 20-0.9 MG/50ML-% IV SOLN
INTRAVENOUS | Status: AC
Start: 2015-07-09 — End: 2015-07-09
  Administered 2015-07-09: 20 mg via INTRAVENOUS
  Filled 2015-07-09: qty 50

## 2015-07-09 MED ORDER — ONDANSETRON HCL 4 MG/2ML IJ SOLN
4.0000 mg | Freq: Four times a day (QID) | INTRAMUSCULAR | Status: DC | PRN
  Administered 2015-07-09: 4 mg via INTRAVENOUS
  Filled 2015-07-09: qty 2

## 2015-07-09 MED ORDER — WARFARIN SODIUM 5 MG PO TABS
5.0000 mg | ORAL_TABLET | ORAL | Status: DC
  Administered 2015-07-09: 5 mg via ORAL
  Filled 2015-07-09: qty 1

## 2015-07-09 MED ORDER — ALPRAZOLAM 0.25 MG PO TABS
0.2500 mg | ORAL_TABLET | Freq: Two times a day (BID) | ORAL | Status: DC | PRN
  Administered 2015-07-09: 0.25 mg via ORAL
  Filled 2015-07-09: qty 1

## 2015-07-09 MED ORDER — FAMOTIDINE IN NACL 20-0.9 MG/50ML-% IV SOLN
20.0000 mg | Freq: Two times a day (BID) | INTRAVENOUS | Status: DC
  Administered 2015-07-09 (×2): 20 mg via INTRAVENOUS
  Filled 2015-07-09 (×3): qty 50

## 2015-07-09 MED ORDER — SEVELAMER CARBONATE 800 MG PO TABS
800.0000 mg | ORAL_TABLET | Freq: Three times a day (TID) | ORAL | Status: DC
  Administered 2015-07-10 – 2015-07-12 (×6): 800 mg via ORAL
  Filled 2015-07-09 (×6): qty 1

## 2015-07-09 MED ORDER — FUROSEMIDE 10 MG/ML IJ SOLN
80.0000 mg | Freq: Once | INTRAMUSCULAR | Status: AC
  Administered 2015-07-09: 80 mg via INTRAVENOUS

## 2015-07-09 MED ORDER — CLONAZEPAM 0.5 MG PO TABS
0.2500 mg | ORAL_TABLET | Freq: Every day | ORAL | Status: DC | PRN
Start: 2015-07-09 — End: 2015-07-12

## 2015-07-09 MED ORDER — SODIUM CHLORIDE 0.9 % IV SOLN: 250.0000 mL | INTRAVENOUS | Status: DC | PRN

## 2015-07-09 MED ORDER — ZOLPIDEM TARTRATE 5 MG PO TABS
5.0000 mg | ORAL_TABLET | Freq: Every evening | ORAL | Status: DC | PRN
  Administered 2015-07-09 – 2015-07-11 (×3): 5 mg via ORAL
  Filled 2015-07-09 (×3): qty 1

## 2015-07-09 MED ORDER — METOPROLOL TARTRATE 50 MG PO TABS
100.0000 mg | ORAL_TABLET | Freq: Two times a day (BID) | ORAL | Status: DC
  Administered 2015-07-09 – 2015-07-11 (×2): 100 mg via ORAL
  Filled 2015-07-09 (×5): qty 2

## 2015-07-09 MED ORDER — CETYLPYRIDINIUM CHLORIDE 0.05 % MT LIQD: 7.0000 mL | Freq: Two times a day (BID) | OROMUCOSAL | Status: DC

## 2015-07-09 MED ORDER — WARFARIN - PHARMACIST DOSING INPATIENT
Freq: Every day | Status: DC
  Administered 2015-07-11: 18:00:00

## 2015-07-09 MED ORDER — OXYCODONE-ACETAMINOPHEN 7.5-325 MG PO TABS
1.0000 | ORAL_TABLET | ORAL | Status: DC | PRN
  Administered 2015-07-10: 1 via ORAL
  Filled 2015-07-09: qty 1

## 2015-07-09 MED ORDER — FUROSEMIDE 10 MG/ML IJ SOLN: 100.0000 mg | Freq: Once | INTRAVENOUS | Status: DC

## 2015-07-09 MED ORDER — INSULIN ASPART 100 UNIT/ML ~~LOC~~ SOLN
2.0000 [IU] | SUBCUTANEOUS | Status: DC
  Administered 2015-07-11 (×2): 2 [IU] via SUBCUTANEOUS
  Filled 2015-07-09 (×3): qty 2

## 2015-07-09 MED ORDER — HYDRALAZINE HCL 20 MG/ML IJ SOLN: 10.0000 mg | INTRAMUSCULAR | Status: DC | PRN

## 2015-07-09 MED ORDER — SERTRALINE HCL 100 MG PO TABS
100.0000 mg | ORAL_TABLET | Freq: Every day | ORAL | Status: DC
  Administered 2015-07-10 – 2015-07-12 (×3): 100 mg via ORAL
  Filled 2015-07-09 (×3): qty 1

## 2015-07-09 NOTE — Consult Note (Signed)
ANTICOAGULATION CONSULT NOTE - Initial Consult  Pharmacy Consult for wafarin Indication:Hx of DVT  No Known Allergies  Patient Measurements:   Heparin Dosing Weight:   Vital Signs: Temp: 97.7 F (36.5 C) (07/02 1247) Temp Source: Axillary (07/02 1247) BP: 162/119 mmHg (07/02 1500) Pulse Rate: 84 (07/02 1500)  Labs:  Recent Labs  07/09/15 1245  HGB 11.8*  HCT 36.2  PLT 226  LABPROT 22.2*  INR 1.96  CREATININE 9.15*  TROPONINI <0.03    CrCl cannot be calculated (Unknown ideal weight.).   Medical History: Past Medical History  Diagnosis Date  . Hypertension   . 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 DVT (deep venous thrombosis) ([facility])     left leg  . CHF (congestive heart failure) ([place])   . Dialysis patient ([place])   . HTN (hypertension)   . Parathyroid abnormality ([place])   . Depression   . Anxiety   . Psoriasis   . Gout   . DVT (deep venous thrombosis) ([place])   . Chronic kidney disease     Medications:  Scheduled:  . heparin  5,000 Units Subcutaneous q12n4p  . insulin aspart  2-6 Units Subcutaneous Q4H  . losartan  50 mg Oral Daily  . metoprolol tartrate  100 mg Oral BID  . sertraline  100 mg Oral Daily  . sevelamer carbonate  800 mg Oral TID WC    Assessment: Pt is a 60 year old female with a hx of DVT on warfarin at home. Pt home dose of warfarin is 7.5mg  M-Thus and 5mg  F, Sat, Sun. INR on admission is slighlt sub therapeutic at 1.96   Goal of Therapy:  INR 2-3 Monitor platelets by anticoagulation protocol: Yes   Plan:  Because pt INR is just barely sub therapeutic, I will continue pt home dose tonight. Recheck INR in the AM.  Ramond Dial, Pharm.D Clinical Pharmacist   07/09/2015,3:07 PM

## 2015-07-09 NOTE — Progress Notes (Signed)
Pt transported to CCU on [address] at 4lpm [place], sats 96%, respiratory rate 26/min, pt tolerated transport to CCU and when  Patient arrived in room she became nauseated and threw up bile, pt desated in the 80's and became tachypnic, placed back on BIPAP at 100%. Will continue to monitor and wean O2.

## 2015-07-09 NOTE — ED Provider Notes (Signed)
[facility] Emergency Department Provider Note   ____________________________________________  Time seen: Seen upon arrival to the emergency department  I have reviewed the triage vital signs and the nursing notes.   HISTORY  Chief Complaint Respiratory Distress    HPI Teresa Cook is a 60 y.o. female with a history of end-stage renal disease on dialysis and CHF who is presenting to the emergency department today with acute onset shortness of breath that started this morning. She is also reporting a constant pressure-like chest pain across her chest. En route with EMS she was found to be on the low 80s initially and then was started on CPAP with resolution to the 90s on oxygen saturation. The patient is reporting improvement in her breathing with CPAP. She was last dialyzed this Friday and had a full session.Patient also had Nitropaste placed en route. Found to be hypertensive at [id number] systolic.   Past Medical History  Diagnosis Date  . Hypertension   . 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 DVT (deep venous thrombosis) (H[facility])     left leg  . CHF (congestive heart failure) ([place])   . Dialysis patient ([place])   . HTN (hypertension)   . Parathyroid abnormality ([place])   . Depression   . Anxiety   . Psoriasis   . Gout   . DVT (deep venous thrombosis) ([place])   . Chronic kidney disease     Patient Active Problem List   Diagnosis Date Noted  . Acute respiratory failure ([place]) 09/19/2014  . Acute pulmonary edema ([place]) 08/30/2014  . Respiratory failure ([place]) 08/29/2014  . Respiratory difficulty 08/29/2014  . Chronic systolic heart failure ([place]) 05/16/2014  . HTN (hypertension) 05/16/2014  . Renal failure 05/16/2014  . Anxiety 05/16/2014    Past Surgical History  Procedure Laterality Date  . Partial hysterectomy    . Abdominal hysterectomy    . Appendectomy      Current Outpatient Rx  Name  Route  Sig  Dispense  Refill  . ALPRAZolam (XANAX) 0.5 MG  tablet   Oral   Take 0.5 mg by mouth daily as needed for anxiety.         . chlorhexidine (PERIDEX) 0.12 % solution   Mouth/Throat   Use as directed 15 mLs in the mouth or throat 2 (two) times daily.         . cinacalcet (SENSIPAR) 30 MG tablet   Oral   Take 30 mg by mouth daily.         . cinacalcet (SENSIPAR) 60 MG tablet   Oral   Take 60 mg by mouth daily.         . citalopram (CELEXA) 20 MG tablet   Oral   Take 20 mg by mouth daily.         . citalopram (CELEXA) 20 MG tablet   Oral   Take 20 mg by mouth daily.         . clindamycin (CLEOCIN) 150 MG capsule   Oral   Take 1 capsule (150 mg total) by mouth 4 (four) times daily.   40 capsule   0   . clonazePAM (KLONOPIN) 0.5 MG tablet   Oral   Take 0.25 mg by mouth daily. Taken before dialysis         . furosemide (LASIX) 20 MG tablet   Oral   Take 1 tablet (20 mg total) by mouth daily.   30 tablet   0   .  losartan (COZAAR) 50 MG tablet   Oral   Take 75 mg by mouth daily.          Marland Kitchen losartan (COZAAR) 50 MG tablet   Oral   Take 50 mg by mouth daily.         . metoprolol (LOPRESSOR) 100 MG tablet   Oral   Take 50 mg by mouth 2 (two) times daily.         . metoprolol (LOPRESSOR) 100 MG tablet   Oral   Take 50 mg by mouth 2 (two) times daily.         Marland Kitchen oxyCODONE-acetaminophen (PERCOCET) 7.5-325 MG tablet   Oral   Take 1 tablet by mouth every 4 (four) hours as needed for severe pain.   20 tablet   0   . pantoprazole (PROTONIX) 20 MG tablet   Oral   Take 20 mg by mouth daily.         . pantoprazole (PROTONIX) 40 MG tablet   Oral   Take 40 mg by mouth daily.          . sertraline (ZOLOFT) 100 MG tablet   Oral   Take 200 mg by mouth daily.         . sertraline (ZOLOFT) 100 MG tablet   Oral   Take 100 mg by mouth daily.         . sevelamer (RENAGEL) 800 MG tablet   Oral   Take 800 mg by mouth 3 (three) times daily with meals.         . sevelamer carbonate  (RENVELA) 800 MG tablet   Oral   Take 800 mg by mouth 3 (three) times daily with meals.         . traMADol (ULTRAM) 50 MG tablet   Oral   Take 50 mg by mouth every 8 (eight) hours as needed.         . traMADol (ULTRAM) 50 MG tablet   Oral   Take by mouth every 8 (eight) hours as needed.         . warfarin (COUMADIN) 5 MG tablet   Oral   Take 5-7.5 mg by mouth daily. Take 7.5mg  on Monday-Saturday & 5mg  on Sunday         . warfarin (COUMADIN) 5 MG tablet   Oral   Take 1-1.5 tablets (5-7.5 mg total) by mouth daily. Take 5mg  on Saturday and Sunday and 7.5mg  on Monday- Friday  Start from tomorrow   20 tablet   0   . zolpidem (AMBIEN) 10 MG tablet   Oral   Take 10 mg by mouth at bedtime as needed for sleep.         Marland Kitchen zolpidem (AMBIEN) 10 MG tablet   Oral   Take 10 mg by mouth at bedtime as needed for sleep.           Allergies Review of patient's allergies indicates no known allergies.  Family History  Problem Relation Age of Onset  . Stroke Mother   . Hypertension Father   . Hypertension Sister   . Diabetes Sister   . Stroke Brother   . Hypertension Brother   . CVA Mother   . Hypertension Mother     Social History Social History  Substance Use Topics  . Smoking status: Never Smoker   . Smokeless tobacco: Never Used  . Alcohol Use: No    Review of Systems Constitutional: No fever/chills Eyes: No visual changes.  ENT: No sore throat. Cardiovascular: As above Respiratory: As above Gastrointestinal: No abdominal pain.  No nausea, no vomiting.  No diarrhea.  No constipation. Genitourinary: Negative for dysuria. Musculoskeletal: Negative for back pain. Skin: Negative for rash. Neurological: Negative for headaches, focal weakness or numbness.  10-point ROS otherwise negative.  ____________________________________________   PHYSICAL EXAM:  VITAL SIGNS: ED Triage Vitals  Enc Vitals Group     BP 07/09/15 1247 200/137 mmHg     Pulse Rate  07/09/15 1247 106     Resp 07/09/15 1247 21     Temp 07/09/15 1247 97.7 F (36.5 C)     Temp Source 07/09/15 1247 Axillary     SpO2 07/09/15 1247 95 %     Weight --      Height --      Head Cir --      Peak Flow --      Pain Score --      Pain Loc --      Pain Edu? --      Excl. in [place]? --     Constitutional: Alert and oriented. Tolerating the CPAP well. Able to speak in 4-5 word sentences. Eyes: Conjunctivae are normal. PERRL. EOMI. Head: Atraumatic. Nose: No congestion/rhinnorhea. Mouth/Throat: Wearing CPAP Neck: No stridor.   Cardiovascular: Tachycardic, regular rhythm. Grossly normal heart sounds.  Right-sided chest permacath with the dressings clean and dry and intact. Respiratory: Tachypneic with labored respirations. Crackles throughout the lung fields to the apices bilaterally. Gastrointestinal: Soft and nontender. No distention.  Musculoskeletal: Mild bilateral lower extremity edema. Neurologic:  Normal speech and language. No gross focal neurologic deficits are appreciated.  Skin:  Skin is warm, dry and intact. No rash noted. Psychiatric: Mood and affect are normal. Speech and behavior are normal.  ____________________________________________   LABS (all labs ordered are listed, but only abnormal results are displayed)  Labs Reviewed  CBC WITH DIFFERENTIAL/PLATELET - Abnormal; Notable for the following:    WBC 13.4 (*)    Hemoglobin 11.8 (*)    RDW 15.9 (*)    Neutro Abs 8.3 (*)    Lymphs Abs 4.0 (*)    All other components within normal limits  BASIC METABOLIC PANEL - Abnormal; Notable for the following:    Glucose, Bld 122 (*)    BUN 51 (*)    Creatinine, Ser 9.15 (*)    Calcium 8.2 (*)    GFR calc non Af Amer 4 (*)    GFR calc Af Amer 5 (*)    All other components within normal limits  PROTIME-INR - Abnormal; Notable for the following:    Prothrombin Time 22.2 (*)    All other components within normal limits  TROPONIN I    ____________________________________________  EKG  ED ECG REPORT I, Doran Stabler, the attending physician, personally viewed and interpreted this ECG.   Date: 07/09/2015  EKG Time: 1245  Rate: 106  Rhythm: sinus tachycardia  Axis: Normal axis  Intervals:none  ST&T Change: Minimal ST depressions in 1, 2, V5 and V6. T-wave inversions in 1 and aVL. No significant change from 09/19/2014. No evidence of STEMI.  ____________________________________________  RADIOLOGY  Pending official read but my interpretation of the chest x-ray as bilateral pulmonary edema. ____________________________________________   PROCEDURES  CRITICAL CARE Performed by: Doran Stabler   Total critical care time: 35 minutes  Critical care time was exclusive of separately billable procedures and treating other patients.  Critical care was necessary to treat  or prevent imminent or life-threatening deterioration.  Critical care was time spent personally by me on the following activities: development of treatment plan with patient and/or surrogate as well as nursing, discussions with consultants, evaluation of patient's response to treatment, examination of patient, obtaining history from patient or surrogate, ordering and performing treatments and interventions, ordering and review of laboratory studies, ordering and review of radiographic studies, pulse oximetry and re-evaluation of patient's condition.  ____________________________________________   INITIAL IMPRESSION / ASSESSMENT AND PLAN / ED COURSE  Pertinent labs & imaging results that were available during my care of the patient were reviewed by me and considered in my medical decision making (see chart for details).  ----------------------------------------- 1:06 PM on 07/09/2015 -----------------------------------------  Patient blood pressure 169/127 after Lasix IV. The Nitropatch on. Spoke to Dr. Candiss Norse of the nephrology  service who is calling the dialysis team for emergent dialysis. Patient updated as to the plan for admission and dialysis and understands and is willing to comply.    ----------------------------------------- 1:39 PM on 07/09/2015 -----------------------------------------  Patient to be admitted to the ICU. Over the past half hour we have paged the ICU 3 times and now contacted the house supervisor because we have not been able to receive a page back from the intensivist on-call pager. We also phoned the ICU and they said that they would check for the doctor then called the ER and we did not receive any phone call.  ----------------------------------------- 1:50 PM on 07/09/2015 -----------------------------------------  Able to reach Dr. Alphonsus Sias on his cell phone only. Says he has not received any pages. Question issue with the paging system. Signed the patient out to Dr. Alphonsus Sias who will admit the patient to the ICU. ___________ _________________________________   FINAL CLINICAL IMPRESSION(S) / ED DIAGNOSES  Flash pulmonary edema.    NEW MEDICATIONS STARTED DURING THIS VISIT:  New Prescriptions   No medications on file     Note:  This document was prepared using Dragon voice recognition software and may include unintentional dictation errors.    Orbie Pyo, MD 07/09/15 1351

## 2015-07-09 NOTE — Progress Notes (Signed)
STARTED DIALYSIS

## 2015-07-09 NOTE — Progress Notes (Signed)
PRE DIALYSIS ASSESSMENT 

## 2015-07-09 NOTE — H&P (Signed)
[facility] H&P    ASSESSMENT/PLAN   60 year old female with end-stage renal disease on hemodialysis, presents with acute pulmonary edema, acute congestive heart failure, hypertensive urgency.  PULMONARY A: Chest x-ray images from 7/2 reviewed, consistent with pulmonary edema.  P:   -Continue BiPAP, currently obtaining good tidal volumes of 450 mL, oxygen saturation is 100%. We'll try to wean to regular oxygen versus high flow oxygen.  CARDIOVASCULAR A: Acute systolic congestive heart failure. -Results of most recent echocardiogram in our system on 08/29/14 reviewed; EF 45%. -Hypertensive urgency. P:  -The patient does still make urine. Therefore, we'll continue diuresis. -Restart home hypertensive medications and use when necessary medications to help control blood pressure.  RENAL A:  End-stage renal disease on hemodialysis. She completed her most recent full hemodialysis session on Friday. P:   -Nephrology consulted.  GASTROINTESTINAL A:   P:   Renal diet  HEMATOLOGIC A:  Leukocytosis, likely stress related, doubt infectious.   INFECTIOUS A: --  Micro/culture results:  BCx2 - UC - Sputum-  Antibiotics:   ENDOCRINE A:  SSI.   NEUROLOGIC A:  -- P:   --   MAJOR EVENTS/TEST RESULTS:   Best Practices  DVT Prophylaxis: heparin.  GI Prophylaxis: --   ---------------------------------------  ---------------------------------------   Name: Felissa Gabbert MRN: [id number] DOB: [birth year]    ADMISSION DATE:  07/09/2015  CHIEF COMPLAINT:  Dyspnea.    HISTORY OF PRESENT ILLNESS:    The patient is a 60 year old female, with a history of end-stage renal disease on hemodialysis, she tells me she has been on Coumadin also for the last 3 years. The patient is on full facial BiPAP. Therefore, her history is limited, most of the history is provided by her husband who was at the bedside. He tells me that she had her full  dialysis session completed on Friday, she gets her dialysis through a permacath, she has refused placement of a long-term fistula, as she had a family member who had a bad outcome from their fistula. She tells me that she is taking all of her medications as prescribed, apparently she still makes urine. However, she has made a decreased amount of urine over the last 2 days. When asked about dietary indiscretion, they both sheepishly lauaghed and admitted that they ate some very delicious barbecue yesterday, he tells me that he went to this barbecue place that he had heard about because he had some business to take care of over there. He went and picked up about a pound a barbecue and brought it home, they both shared it over the last day, they said it was very delicious. The patient tells me that her breathing has improved since she came in to the hospital, but she is still short of breath.  When asked whether that she would want before life support if needed. She is very hesitant to admit that she would want to, however, her husband intervened and said that she would want life support. She is apparently been on life support for a week. In the past and does not want to go on it again, she grudgingly admitted that she would accept temporary life support if needed and then her family couldn't decide further.   PAST MEDICAL HISTORY :  Past Medical History  Diagnosis Date  . Hypertension   . 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 DVT (deep venous thrombosis) ([facility])     left leg  . CHF (congestive heart failure) ([place])   .  Dialysis patient ([place])   . HTN (hypertension)   . Parathyroid abnormality ([place])   . Depression   . Anxiety   . Psoriasis   . Gout   . DVT (deep venous thrombosis) ([place])   . Chronic kidney disease    Past Surgical History  Procedure Laterality Date  . Partial hysterectomy    . Abdominal hysterectomy    . Appendectomy     Prior to Admission medications   Medication Sig Start Date End  Date Taking? Authorizing Provider  ALPRAZolam Duanne Moron) 0.5 MG tablet Take 0.5 mg by mouth daily as needed for anxiety.   Yes Historical Provider, MD  cinacalcet (SENSIPAR) 30 MG tablet Take 30 mg by mouth every morning. *Take along with 60 mg tablet to equal 90 mg total dose*   Yes Historical Provider, MD  cinacalcet (SENSIPAR) 60 MG tablet Take 60 mg by mouth daily. *Take along with 30 mg tablet to equal 90 mg total dose*   Yes Historical Provider, MD  clonazePAM (KLONOPIN) 0.5 MG tablet Take 0.25 mg by mouth daily. Taken before dialysis   Yes Historical Provider, MD  losartan (COZAAR) 50 MG tablet Take 50 mg by mouth daily.   Yes Historical Provider, MD  metoprolol (LOPRESSOR) 100 MG tablet Take 50 mg by mouth 2 (two) times daily.   Yes Historical Provider, MD  pantoprazole (PROTONIX) 20 MG tablet Take 20 mg by mouth daily.   Yes Historical Provider, MD  sertraline (ZOLOFT) 100 MG tablet Take 100 mg by mouth daily.   Yes Historical Provider, MD  sevelamer carbonate (RENVELA) 800 MG tablet Take 2,400 mg by mouth 3 (three) times daily with meals.    Yes Historical Provider, MD  traMADol (ULTRAM) 50 MG tablet Take 50 mg by mouth every 8 (eight) hours as needed for moderate pain or severe pain.    Yes Historical Provider, MD  warfarin (COUMADIN) 5 MG tablet Take 5-7.5 mg by mouth See admin instructions. Take 7.5mg  by mouth on Monday -Thursday. Take 5 mg by mouth on Friday, Saturday, and Sunday.   Yes Historical Provider, MD  zolpidem (AMBIEN) 10 MG tablet Take 10 mg by mouth at bedtime as needed for sleep.   Yes Historical Provider, MD  clindamycin (CLEOCIN) 150 MG capsule Take 1 capsule (150 mg total) by mouth 4 (four) times daily. 11/28/14   Sable Feil, PA-C  furosemide (LASIX) 20 MG tablet Take 1 tablet (20 mg total) by mouth daily. 09/20/14   [name], MD  oxyCODONE-acetaminophen (PERCOCET) 7.5-325 MG tablet Take 1 tablet by mouth every 4 (four) hours as needed for severe pain. 11/28/14    Sable Feil, PA-C  warfarin (COUMADIN) 5 MG tablet Take 1-1.5 tablets (5-7.5 mg total) by mouth daily. Take 5mg  on Saturday and Sunday and 7.5mg  on Monday- Friday  Start from tomorrow 09/20/14   [name], MD   No Known Allergies  FAMILY HISTORY:  Family History  Problem Relation Age of Onset  . Stroke Mother   . Hypertension Father   . Hypertension Sister   . Diabetes Sister   . Stroke Brother   . Hypertension Brother   . CVA Mother   . Hypertension Mother    SOCIAL HISTORY:  reports that she has never smoked. She has never used smokeless tobacco. She reports that she does not drink alcohol or use illicit drugs.  REVIEW OF SYSTEMS:   Constitutional: Feels well. Cardiovascular: No chest pain.  Pulmonary: Denies cough, denies increased sputum production.Marland Kitchen  The remainder of systems were reviewed and were found to be negative other than what is documented in the HPI.    VITAL SIGNS: Temp:  [97.7 F (36.5 C)] 97.7 F (36.5 C) (07/02 1247) Pulse Rate:  [84-106] 84 (07/02 1430) Resp:  [15-28] 28 (07/02 1430) BP: (161-200)/(118-137) 163/124 mmHg (07/02 1430) SpO2:  [95 %-100 %] 100 % (07/02 1430) HEMODYNAMICS:   VENTILATOR SETTINGS:   INTAKE / OUTPUT: No intake or output data in the 24 hours ending 07/09/15 1457  Physical Examination:   VS: BP 163/124 mmHg  Pulse 84  Temp(Src) 97.7 F (36.5 C) (Axillary)  Resp 28  SpO2 100%  General Appearance: No distress  Neuro:without focal findings, mental status, speech normal,. HEENT: PERRLA, EOM intact, no ptosis, no other lesions noticed;  Pulmonary: Bilateral crackles heard in both lungs, more in the bases. diaphragmatic excursion normal. CardiovascularNormal S1,S2.  No m/r/g.    Abdomen: Benign, Soft, non-tender, No masses, hepatosplenomegaly, No lymphadenopathy Renal:  No costovertebral tenderness  GU:  Not performed at this time. Endoc: No evident thyromegaly, no signs of acromegaly. Skin:   warm, no rashes, no  ecchymosis  Extremities: normal, no cyanosis, clubbing, no edema, warm with normal capillary refill.    LABS: Reviewed   LABORATORY PANEL:   CBC  Recent Labs Lab 07/09/15 1245  WBC 13.4*  HGB 11.8*  HCT 36.2  PLT 226    Chemistries   Recent Labs Lab 07/09/15 1245  NA 140  K 4.9  CL 105  CO2 23  GLUCOSE 122*  BUN 51*  CREATININE 9.15*  CALCIUM 8.2*    No results for input(s): GLUCAP in the last 168 hours. No results for input(s): PHART, PCO2ART, PO2ART in the last 168 hours. No results for input(s): AST, ALT, ALKPHOS, BILITOT, ALBUMIN in the last 168 hours.  Cardiac Enzymes  Recent Labs Lab 07/09/15 1245  TROPONINI <0.03    RADIOLOGY:  Dg Chest 1 View  07/09/2015  CLINICAL DATA:  Respiratory distress EXAM: CHEST 1 VIEW COMPARISON:  08/30/2014 FINDINGS: 1253 hours. Diffuse interstitial and central alveolar opacity compatible with edema. Cardiopericardial silhouette is at upper limits of normal for size. Right IJ dialysis catheter tip overlies the distal SVC. Telemetry leads overlie the chest. IMPRESSION: Imaging features compatible with pulmonary edema. Electronically Signed   By: Misty Stanley M.D.   On: 07/09/2015 13:06       --Deep Ashby Dawes, MD.  Board Certified in Internal Medicine, Pulmonary Medicine, [place], and Sleep Medicine.  ICU Pager [phone] [facility] Office Number: [phone]  Patricia Pesa, M.D.  Vilinda Boehringer, M.D.  Merton Border, M.D   07/09/2015, 2:57 PM  Critical Care Attestation.  I have personally obtained a history, examined the patient, evaluated laboratory and imaging results, formulated the assessment and plan and placed orders. The Patient requires high complexity decision making for assessment and support, frequent evaluation and titration of therapies, application of advanced monitoring technologies and extensive interpretation of multiple databases. The patient has critical  illness that could lead imminently to failure of 1 or more organ systems and requires the highest level of physician preparedness to intervene.  Critical Care Time devoted to patient care services described in this note is 40 minutes and is exclusive of time spent in procedures.

## 2015-07-09 NOTE — Progress Notes (Signed)
Subjective:   Patient known to our practice from previous admissions  Presents with acute shortness of breath CXR shows acute pulm edema Patient currently on NIPPV Given iv lasix, NTG paste Patient states she did not miss any treatment  Objective:  Vital signs in last 24 hours:  Temp:  [96.7 F (35.9 C)-97.7 F (36.5 C)] 96.7 F (35.9 C) (07/02 1621) Pulse Rate:  [81-106] 96 (07/02 1621) Resp:  [15-28] 24 (07/02 1621) BP: (157-200)/(116-137) 176/123 mmHg (07/02 1621) SpO2:  [95 %-100 %] 100 % (07/02 1621) Weight:  [69.4 kg (153 lb)] 69.4 kg (153 lb) (07/02 1624)  Weight change:  Filed Weights   07/09/15 1624  Weight: 69.4 kg (153 lb)    Intake/Output:   No intake or output data in the 24 hours ending 07/09/15 1713   Physical Exam: General: Moderate distress  HEENT anicteric  Neck soft  Pulm/lungs Diffuse b/l crackles, NIPPV  CVS/Heart tachycardic  Abdomen:  Soft, NT, non distended  Extremities: No peripheral edema  Neurologic: Alert, able to answer questions  Skin: No acute rashes  Access: Rt IJ PC       Basic Metabolic Panel:   Recent Labs Lab 07/09/15 1245 07/09/15 1500  NA 140  --   K 4.9  --   CL 105  --   CO2 23  --   GLUCOSE 122*  --   BUN 51*  --   CREATININE 9.15* 9.28*  CALCIUM 8.2*  --      CBC:  Recent Labs Lab 07/09/15 1245  WBC 13.4*  NEUTROABS 8.3*  HGB 11.8*  HCT 36.2  MCV 86.9  PLT 226      Microbiology:  No results found for this or any previous visit (from the past 720 hour(s)).  Coagulation Studies:  Recent Labs  07/09/15 1245  LABPROT 22.2*  INR 1.96    Urinalysis: No results for input(s): COLORURINE, LABSPEC, PHURINE, GLUCOSEU, HGBUR, BILIRUBINUR, KETONESUR, PROTEINUR, UROBILINOGEN, NITRITE, LEUKOCYTESUR in the last 72 hours.  Invalid input(s): APPERANCEUR    Imaging: Dg Chest 1 View  07/09/2015  CLINICAL DATA:  Respiratory distress EXAM: CHEST 1 VIEW COMPARISON:  08/30/2014 FINDINGS: 1253 hours.  Diffuse interstitial and central alveolar opacity compatible with edema. Cardiopericardial silhouette is at upper limits of normal for size. Right IJ dialysis catheter tip overlies the distal SVC. Telemetry leads overlie the chest. IMPRESSION: Imaging features compatible with pulmonary edema. Electronically Signed   By: Misty Stanley M.D.   On: 07/09/2015 13:06     Medications:     . antiseptic oral rinse  7 mL Mouth Rinse q12n4p  . chlorhexidine  15 mL Mouth Rinse BID  . famotidine (PEPCID) IV  20 mg Intravenous Q12H  . heparin  5,000 Units Subcutaneous q12n4p  . insulin aspart  2-6 Units Subcutaneous Q4H  . losartan  50 mg Oral Daily  . metoprolol tartrate  100 mg Oral BID  . sertraline  100 mg Oral Daily  . sevelamer carbonate  800 mg Oral TID WC   sodium chloride, ALPRAZolam, clonazePAM, cloNIDine, ondansetron (ZOFRAN) IV, zolpidem  Assessment/ Plan:  60 y.o.african [place] female with end-stage renal disease, hypertension, cardiomyopathy, chronic systolic congestive heart failure with EF of [id number], grade 1 diastolic dysfuncion presents for shortness of breath and is diagnosed with pulmonary edema. Similar presentation in sep 2016  1. End-stage renal disease. [facility] nephrology. [employer]. M-W-F 2. Acute pulmonary edema requiring NIPPV 3. Anemia of chronic kidney disease 4. Secondary hyperparathyroidism  Plan:  Urgent HD today Goal UF 3-4 L as tolerated BP is expected to improve with Volume removal therefore will use short acting vasodilator aviod EPO as BP is too high Monitor phos during this admission   LOS: 0 Dorien Mayotte 7/2/20175:13 PM

## 2015-07-09 NOTE — ED Notes (Signed)
Pt presents to ED from home via EMS c/o respiratory distress. Hx CHF, dialysis patient M/W/Fri. Friday pt had 1L fluid removed during dialysis. Pt given albuterol/duoneb by EMS as well as 1.5" nitro paste for BP 198/142. O2 sat initially low 80s, pt placed on BiPAP by EMS, capnography mid-upper 20s

## 2015-07-09 NOTE — Progress Notes (Signed)
POST DIALYSIS ASSESSMENT 

## 2015-07-10 ENCOUNTER — Inpatient Hospital Stay: Payer: Medicare Other

## 2015-07-10 DIAGNOSIS — N186 End stage renal disease: Secondary | ICD-10-CM

## 2015-07-10 DIAGNOSIS — I10 Essential (primary) hypertension: Secondary | ICD-10-CM

## 2015-07-10 DIAGNOSIS — Z992 Dependence on renal dialysis: Secondary | ICD-10-CM

## 2015-07-10 DIAGNOSIS — I5023 Acute on chronic systolic (congestive) heart failure: Secondary | ICD-10-CM

## 2015-07-10 LAB — CBC
HCT: 32.6 % — ABNORMAL LOW (ref 35.0–47.0)
HEMOGLOBIN: 10.9 g/dL — AB (ref 12.0–16.0)
MCH: 28.3 pg (ref 26.0–34.0)
MCHC: 33.5 g/dL (ref 32.0–36.0)
MCV: 84.4 fL (ref 80.0–100.0)
PLATELETS: 116 10*3/uL — AB (ref 150–440)
RBC: 3.87 MIL/uL (ref 3.80–5.20)
RDW: 15.7 % — ABNORMAL HIGH (ref 11.5–14.5)
WBC: 14.3 10*3/uL — ABNORMAL HIGH (ref 3.6–11.0)

## 2015-07-10 LAB — BASIC METABOLIC PANEL
Anion gap: 11 (ref 5–15)
BUN: 30 mg/dL — ABNORMAL HIGH (ref 6–20)
CO2: 28 mmol/L (ref 22–32)
Calcium: 7.9 mg/dL — ABNORMAL LOW (ref 8.9–10.3)
Chloride: 99 mmol/L — ABNORMAL LOW (ref 101–111)
Creatinine, Ser: 5.88 mg/dL — ABNORMAL HIGH (ref 0.44–1.00)
GFR calc Af Amer: 8 mL/min — ABNORMAL LOW (ref >60)
GFR, EST NON AFRICAN AMERICAN: 7 mL/min — AB (ref >60)
GLUCOSE: 96 mg/dL (ref 65–99)
POTASSIUM: 4.8 mmol/L (ref 3.5–5.1)
Sodium: 138 mmol/L (ref 135–145)

## 2015-07-10 LAB — GLUCOSE, CAPILLARY
GLUCOSE-CAPILLARY: 79 mg/dL (ref 65–99)
Glucose-Capillary: 113 mg/dL — ABNORMAL HIGH (ref 65–99)
Glucose-Capillary: 123 mg/dL — ABNORMAL HIGH (ref 65–99)
Glucose-Capillary: 83 mg/dL (ref 65–99)
Glucose-Capillary: 99 mg/dL (ref 65–99)

## 2015-07-10 LAB — MAGNESIUM: Magnesium: 1.7 mg/dL (ref 1.7–2.4)

## 2015-07-10 LAB — PROTIME-INR
INR: 2.26
Prothrombin Time: 24.7 seconds — ABNORMAL HIGH (ref 11.4–15.0)

## 2015-07-10 LAB — TROPONIN I: TROPONIN I: 0.35 ng/mL — AB (ref <0.03)

## 2015-07-10 MED ORDER — PANTOPRAZOLE SODIUM 40 MG PO TBEC
40.0000 mg | DELAYED_RELEASE_TABLET | Freq: Every day | ORAL | Status: DC
  Administered 2015-07-10 – 2015-07-12 (×3): 40 mg via ORAL
  Filled 2015-07-10 (×3): qty 1

## 2015-07-10 MED ORDER — DOCUSATE SODIUM 100 MG PO CAPS
100.0000 mg | ORAL_CAPSULE | Freq: Two times a day (BID) | ORAL | Status: DC
  Administered 2015-07-10: 100 mg via ORAL
  Filled 2015-07-10 (×4): qty 1

## 2015-07-10 MED ORDER — CETYLPYRIDINIUM CHLORIDE 0.05 % MT LIQD
7.0000 mL | Freq: Two times a day (BID) | OROMUCOSAL | Status: DC
  Administered 2015-07-10 – 2015-07-11 (×2): 7 mL via OROMUCOSAL

## 2015-07-10 NOTE — Progress Notes (Signed)
Post dialysis 

## 2015-07-10 NOTE — Consult Note (Signed)
ANTICOAGULATION CONSULT NOTE - Initial Consult  Pharmacy Consult for wafarin Indication:Hx of DVT  No Known Allergies  Patient Measurements: Height: 5\' 7"  (170.2 cm) Weight: 167 lb 5.3 oz (75.9 kg) IBW/kg (Calculated) : 61.6 Heparin Dosing Weight:   Vital Signs: Temp: 97.7 F (36.5 C) (07/03 0800) Temp Source: Axillary (07/03 0800) BP: 138/121 mmHg (07/03 0900) Pulse Rate: 89 (07/03 0900)  Labs:  Recent Labs  07/09/15 1245 07/09/15 1500 07/09/15 1700 07/09/15 1755 07/09/15 2120 07/10/15 0224  HGB 11.8*  --  12.3  --   --  10.9*  HCT 36.2  --  36.9  --   --  32.6*  PLT 226  --  213  --   --  116*  LABPROT 22.2*  --   --   --   --  24.7*  INR 1.96  --   --   --   --  2.26  CREATININE 9.15* 9.28*  --  6.71*  --  5.88*  TROPONINI <0.03 0.04*  --   --  0.22* 0.35*    Estimated Creatinine Clearance: 10.7 mL/min (by C-G formula based on Cr of 5.88).   Medical History: Past Medical History  Diagnosis Date  . Hypertension   . Parathyroid disease ([place])   . DVT (deep venous thrombosis) ([facility])     left leg  . CHF (congestive heart failure) ([place])   . Dialysis patient ([place])   . HTN (hypertension)   . Parathyroid abnormality ([place])   . Depression   . Anxiety   . Psoriasis   . Gout   . DVT (deep venous thrombosis) ([place])   . Chronic kidney disease     Medications:  Scheduled:  . antiseptic oral rinse  7 mL Mouth Rinse BID  . famotidine (PEPCID) IV  20 mg Intravenous Q12H  . insulin aspart  2-6 Units Subcutaneous Q4H  . losartan  50 mg Oral Daily  . metoprolol tartrate  100 mg Oral BID  . sertraline  100 mg Oral Daily  . sevelamer carbonate  800 mg Oral TID WC  . warfarin  5 mg Oral Once per day on Sun Fri Sat  . warfarin  7.5 mg Oral Once per day on Mon Tue Wed Thu  . Warfarin - Pharmacist Dosing Inpatient   Does not apply q1800    Assessment: Pt is a 60 year old female with a hx of DVT on warfarin at home. Pt home dose of warfarin is 7.5mg  M-Thus and 5mg  F,  Sat, Sun. INR on admission is slighlt sub therapeutic at 1.96   Goal of Therapy:  INR 2-3 Monitor platelets by anticoagulation protocol: Yes   Plan:  INR therapeutic on home dose. Will continue Coumadin as ordered and f/u AM INR. If INR remains stable, will d/c daily checks.   Ulice Dash, PharmD Clinical Pharmacist    07/10/2015,9:40 AM

## 2015-07-10 NOTE — Progress Notes (Signed)
Pre Dialysis 

## 2015-07-10 NOTE — Progress Notes (Signed)
Tx started 

## 2015-07-10 NOTE — Plan of Care (Signed)
Problem: Physical Regulation: Goal: Complications related to the disease process, condition or treatment will be avoided or minimized Outcome: Progressing Patient did not eat more than half of tray during shift. Only complained of headache, relieved by prn medication. Up to [facility] with 1 assist in AM for very small void and small bowel movement. Tolerated dialysis well per dialysis RN. Awaiting bed for transfer out of ICU.

## 2015-07-10 NOTE — Progress Notes (Signed)
[facility] H&P    ASSESSMENT/PLAN   60 year old female with end-stage renal disease on hemodialysis, presents with acute pulmonary edema, acute congestive heart failure, hypertensive urgency.  PULMONARY A: Chest x-ray images from 7/2 reviewed, consistent with pulmonary edema.  P:   - BiPAP QHS inpatient only and wean [place] as tolerated - avoid fluid overload and excessive salt intake.  - stable to transfer out of the ICU  CARDIOVASCULAR A:  Acute systolic congestive heart failure.-Results of most recent echocardiogram in our system on 08/29/14 reviewed; EF 45%. Hypertensive urgency-BP improved. P:  -Dialysis and diuresis per nephrology - Continue prn hydralazine, cozaar, and metoprolol  RENAL A:   End-stage renal disease on hemodialyses-last session 07/02-[place] out P:   -Nephrology following -HD per nephrology -Monitor and replace electrolytes  GASTROINTESTINAL A:   No acute issues P:   -Continue Renal diet when off BiPAP  HEMATOLOGIC A:  Anemia of chronic disease Mild thrombocytopenia P:  -Monitor CBC and transfuse prn  INFECTIOUS A:  Leukocytosis, likely stress related, doubt infectious.  P: -Monitor for fever and pan culture if febrile -Monitor CBC  Micro/culture results:  BCx2 - UC - Sputum-  Antibiotics:   ENDOCRINE A:  SSI.   NEUROLOGIC A:  -- P:   --   MAJOR EVENTS/TEST RESULTS:   Best Practices  DVT Prophylaxis: heparin.  GI Prophylaxis: --   ---------------------------------------  ---------------------------------------   Name: Teresa Cook MRN: [id number] DOB: 04-09-1955    ADMISSION DATE:  07/09/2015  CHIEF COMPLAINT:  Dyspnea.    HISTORY OF PRESENT ILLNESS:    The patient is a 60 year old female, with a history of end-stage renal disease on hemodialysis, she tells me she has been on Coumadin also for the last 3 years. The patient is on full facial BiPAP. Therefore, her history is  limited, most of the history is provided by her husband who was at the bedside. He tells me that she had her full dialysis session completed on Friday, she gets her dialysis through a permacath, she has refused placement of a long-term fistula, as she had a family member who had a bad outcome from their fistula. She tells me that she is taking all of her medications as prescribed, apparently she still makes urine. However, she has made a decreased amount of urine over the last 2 days. When asked about dietary indiscretion, they both sheepishly lauaghed and admitted that they ate some very delicious barbecue yesterday, he tells me that he went to this barbecue place that he had heard about because he had some business to take care of over there. He went and picked up about a pound a barbecue and brought it home, they both shared it over the last day, they said it was very delicious. The patient tells me that her breathing has improved since she came in to the hospital, but she is still short of breath.  When asked whether that she would want before life support if needed. She is very hesitant to admit that she would want to, however, her husband intervened and said that she would want life support. She is apparently been on life support for a week. In the past and does not want to go on it again, she grudgingly admitted that she would accept temporary life support if needed and then her family couldn't decide further.  SUBJECTIVE: No acute issues overnight. Doing well this AM, transitioned to 4L [place].   VITAL SIGNS: Temp:  [96.7  F (35.9 C)-97.7 F (36.5 C)] 97.5 F (36.4 C) (07/02 2015) Pulse Rate:  [54-111] 77 (07/03 0400) Resp:  [15-42] 29 (07/03 0400) BP: (75-200)/(60-140) 137/85 mmHg (07/03 0400) SpO2:  [95 %-100 %] 100 % (07/03 0400) Weight:  [153 lb (69.4 kg)-167 lb 5.3 oz (75.9 kg)] 167 lb 5.3 oz (75.9 kg) (07/03 0558) HEMODYNAMICS:   VENTILATOR SETTINGS:   INTAKE /  OUTPUT:  Intake/Output Summary (Last 24 hours) at 07/10/15 0646 Last data filed at 07/09/15 2115  Gross per 24 hour  Intake      0 ml  Output   1053 ml  Net  -1053 ml    Physical Examination:   VS: BP 137/85 mmHg  Pulse 77  Temp(Src) 97.5 F (36.4 C) (Axillary)  Resp 29  Ht 5\' 7"  (1.702 m)  Wt 167 lb 5.3 oz (75.9 kg)  BMI 26.20 kg/m2  SpO2 100%   General Appearance:NAD Neuro:AAO X3, speech is normal, moves all extremities HEENT: PERRLA, EOM intact, no ptosis, no other lesions noticed;  Pulmonary: Bilateral crackles heard in both lungs, more in the bases. diaphragmatic excursion normal. CardiovascularNormal S1,S2.  No m/r/g.    Abdomen: Soft, non-tender, normal bowel sounds, No masses, hepatosplenomegaly, No lymphadenopathy Renal:  No costovertebral tenderness  Endoc: No evident thyromegaly, no signs of acromegaly. Skin:   warm, no rashes, no ecchymosis  Extremities: normal, no cyanosis, clubbing, no edema, warm with normal capillary refill.    LABS: Reviewed   LABORATORY PANEL:   CBC  Recent Labs Lab 07/10/15 0224  WBC 14.3*  HGB 10.9*  HCT 32.6*  PLT 116*    Chemistries   Recent Labs Lab 07/09/15 1755 07/10/15 0224  NA 138 138  K 4.6 4.8  CL 101 99*  CO2 26 28  GLUCOSE 143* 96  BUN 38* 30*  CREATININE 6.71* 5.88*  CALCIUM 8.0* 7.9*  MG 1.9 1.7  PHOS 4.5  --      Recent Labs Lab 07/09/15 1706 07/09/15 1942 07/09/15 2332 07/10/15 0354  GLUCAP 126* 71 88 79   No results for input(s): PHART, PCO2ART, PO2ART in the last 168 hours. No results for input(s): AST, ALT, ALKPHOS, BILITOT, ALBUMIN in the last 168 hours.  Cardiac Enzymes  Recent Labs Lab 07/10/15 0224  TROPONINI 0.35*    RADIOLOGY:  Dg Chest 1 View  07/09/2015  CLINICAL DATA:  Respiratory distress EXAM: CHEST 1 VIEW COMPARISON:  08/30/2014 FINDINGS: 1253 hours. Diffuse interstitial and central alveolar opacity compatible with edema. Cardiopericardial silhouette is at upper  limits of normal for size. Right IJ dialysis catheter tip overlies the distal SVC. Telemetry leads overlie the chest. IMPRESSION: Imaging features compatible with pulmonary edema. Electronically Signed   By: Misty Stanley M.D.   On: 07/09/2015 13:06   Total CCM time is 35 minutes  Magdalene S. [facility] ANP-BC Pulmonary and [place] Pager [phone] or [phone] 07/10/2015, 6:46 AM  STAFF NOTE: I, Dr. Vilinda Boehringer have personally reviewed patient's available data, including medical history, events of note, physical examination and test results as part of my evaluation. I have discussed with NP Patria Mane and other care providers such as pharmacist, RN and RRT.      (The following images and results were reviewed by Dr. Stevenson Clinch on 07/10/2015). Dg Chest 1 View  07/09/2015  CLINICAL DATA:  Respiratory distress EXAM: CHEST 1 VIEW COMPARISON:  08/30/2014 FINDINGS: 1253 hours. Diffuse interstitial and central alveolar opacity compatible with edema. Cardiopericardial silhouette is at upper limits  of normal for size. Right IJ dialysis catheter tip overlies the distal SVC. Telemetry leads overlie the chest. IMPRESSION: Imaging features compatible with pulmonary edema. Electronically Signed   By: Misty Stanley M.D.   On: 07/09/2015 13:06   Dg Chest Port 1 View  07/10/2015  CLINICAL DATA:  Patient with history of end-stage renal disease presenting 07/09/2015 with acute pulmonary edema and hypertensive urgency. EXAM: PORTABLE CHEST 1 VIEW COMPARISON:  Single-view of the chest 07/09/2015, 08/30/2014 and 01/16/2014. FINDINGS: The patient is rotated on the examination. Dialysis catheter is again seen. Interstitial pulmonary edema seen on the most recent examination is improved. There is airspace disease in the lung bases, more notable on the right. No pneumothorax or pleural effusion. No focal bony abnormality. IMPRESSION: Improved pulmonary edema. Right worse than left basilar airspace  disease could be due to atelectasis or pneumonia. Electronically Signed   By: Inge Rise M.D.   On: 07/10/2015 07:21      A/P: 60 year old female with end-stage renal disease on HD, with acute pulmonary edema, status post hemodialysis. Required brief episode of BiPAP for fluid overload. Not doing significantly better. -Diet control of salt intake and fluid intake is paramount to her overall respiratory status -Continue hemodialysis -Stable to transfer out of the ICU. Spoke with Dr. Ether Griffins, who accepted patient to the Hospitalist service as of 7/4 AM.    Thank you for consulting [place] Pulmonary and Critical Care, we will signoff at this time.  Please feel free to contact us with any questions at [phone] (please enter [phone]-digits).   .  Rest per NP/medical resident whose note is outlined above and that I agree with  Critical Care Time devoted to patient care services described in this note is  35 Minutes.   This time reflects time of care of this signee Dr Vilinda Boehringer.  This critical care time does not reflect procedure time, or teaching time or supervisory time of PA/NP/Med-student/Med Resident etc but could involve care discussion time.  Vilinda Boehringer, MD  Pulmonary and Critical Care Pager [phone] (please enter 7-digits) On Call Pager [phone] (please enter 7-digits)  Note: This note was prepared with Dragon dictation along with smaller phrase technology. Any transcriptional errors that result from this process are unintentional.

## 2015-07-10 NOTE — Discharge Instructions (Signed)
Heart Failure Clinic appointment on July 27, 2015 at 10:30am with Darylene Price, [place]. Please call [phone] to reschedule.

## 2015-07-10 NOTE — Progress Notes (Signed)
Teresa Cook at [place] NAME: Teresa Cook    MR#:  [id number]  DATE OF BIRTH:  1955-07-01  SUBJECTIVE:  CHIEF COMPLAINT:   Chief Complaint  Patient presents with  . Respiratory Distress  Patient is 60 year old African American female with a several history significant for history of end-stage renal disease, hemodialysis dependent, who never missed a day of dialysis during all her life, who presented to the hospital with complaints of severe shortness of breath, chest x-ray revealed pulmonary edema, acute congestive heart failure. Blood pressure was highly elevated to 170s. Initial chest x-ray was consistent with pulmonary edema, no pneumonia. Initially, however, atelectasis versus pneumonia was noted on the right and today's chest x-ray. Patient admits of some cough with no significant sputum production, she has improved now on serial dialysis sessions, weaned off BiPAP and is on oxygen at 3 L of oxygen via nasal cannulas, not on oxygen at home at all   Review of Systems  Constitutional: Positive for chills. Negative for fever, weight loss and malaise/fatigue.  HENT: Negative for congestion.   Eyes: Negative for blurred vision and double vision.  Respiratory: Positive for cough and shortness of breath. Negative for sputum production and wheezing.   Cardiovascular: Positive for chest pain and palpitations. Negative for orthopnea, leg swelling and PND.  Gastrointestinal: Negative for nausea, vomiting, abdominal pain, diarrhea, constipation, blood in stool and melena.  Genitourinary: Negative for dysuria, urgency, frequency and hematuria.  Musculoskeletal: Negative for falls.  Skin: Negative for rash.  Neurological: Positive for weakness. Negative for dizziness.  Psychiatric/Behavioral: Negative for depression and memory loss. The patient is not nervous/anxious.     VITAL SIGNS: Blood pressure 142/95, pulse 67, temperature 98.6 F  (37 C), temperature source Oral, resp. rate 20, height 5\' 7"  (1.702 m), weight 76.3 kg (168 lb 3.4 oz), SpO2 92 %.  PHYSICAL EXAMINATION:   GENERAL:  60 y.o.-year-old patient lying in the bed with no acute distress. Permanent hemodialysis catheter in the right chest EYES: Pupils equal, round, reactive to light and accommodation. No scleral icterus. Extraocular muscles intact.  HEENT: Head atraumatic, normocephalic. Oropharynx and nasopharynx clear.  NECK:  Supple, no jugular venous distention. No thyroid enlargement, no tenderness.  LUNGS: Diminished breath sounds bilaterally, no wheezing, rales,rhonchi or crepitation. No use of accessory muscles of respiration.  CARDIOVASCULAR: S1, S2 normal. No murmurs, rubs, or gallops.  ABDOMEN: Soft, nontender, nondistended. Bowel sounds present. No organomegaly or mass.  EXTREMITIES: No pedal edema, cyanosis, or clubbing.  NEUROLOGIC: Cranial nerves II through XII are intact. Muscle strength 5/5 in all extremities. Sensation intact. Gait not checked.  PSYCHIATRIC: The patient is alert and oriented x 3.  SKIN: No obvious rash, lesion, or ulcer.   ORDERS/RESULTS REVIEWED:   CBC  Recent Labs Lab 07/09/15 1245 07/09/15 1700 07/10/15 0224  WBC 13.4* 14.2* 14.3*  HGB 11.8* 12.3 10.9*  HCT 36.2 36.9 32.6*  PLT 226 213 116*  MCV 86.9 85.6 84.4  MCH 28.4 28.5 28.3  MCHC 32.6 33.3 33.5  RDW 15.9* 15.8* 15.7*  LYMPHSABS 4.0*  --   --   MONOABS 0.6  --   --   EOSABS 0.3  --   --   BASOSABS 0.1  --   --    ------------------------------------------------------------------------------------------------------------------  Chemistries   Recent Labs Lab 07/09/15 1245 07/09/15 1500 07/09/15 1755 07/10/15 0224  NA 140  --  138 138  K 4.9  --  4.6 4.8  CL 105  --  101 99*  CO2 23  --  26 28  GLUCOSE 122*  --  143* 96  BUN 51*  --  38* 30*  CREATININE 9.15* 9.28* 6.71* 5.88*  CALCIUM 8.2*  --  8.0* 7.9*  MG  --   --  1.9 1.7    ------------------------------------------------------------------------------------------------------------------ estimated creatinine clearance is 10.7 mL/min (by C-G formula based on Cr of 5.88). ------------------------------------------------------------------------------------------------------------------ No results for input(s): TSH, T4TOTAL, T3FREE, THYROIDAB in the last 72 hours.  Invalid input(s): FREET3  Cardiac Enzymes  Recent Labs Lab 07/09/15 1500 07/09/15 2120 07/10/15 0224  TROPONINI 0.04* 0.22* 0.35*   ------------------------------------------------------------------------------------------------------------------ Invalid input(s): POCBNP ---------------------------------------------------------------------------------------------------------------  RADIOLOGY: Dg Chest 1 View  07/09/2015  CLINICAL DATA:  Respiratory distress EXAM: CHEST 1 VIEW COMPARISON:  08/30/2014 FINDINGS: 1253 hours. Diffuse interstitial and central alveolar opacity compatible with edema. Cardiopericardial silhouette is at upper limits of normal for size. Right IJ dialysis catheter tip overlies the distal SVC. Telemetry leads overlie the chest. IMPRESSION: Imaging features compatible with pulmonary edema. Electronically Signed   By: Misty Stanley M.D.   On: 07/09/2015 13:06   Dg Chest Port 1 View  07/10/2015  CLINICAL DATA:  Patient with history of end-stage renal disease presenting 07/09/2015 with acute pulmonary edema and hypertensive urgency. EXAM: PORTABLE CHEST 1 VIEW COMPARISON:  Single-view of the chest 07/09/2015, 08/30/2014 and 01/16/2014. FINDINGS: The patient is rotated on the examination. Dialysis catheter is again seen. Interstitial pulmonary edema seen on the most recent examination is improved. There is airspace disease in the lung bases, more notable on the right. No pneumothorax or pleural effusion. No focal bony abnormality. IMPRESSION: Improved pulmonary edema. Right worse than  left basilar airspace disease could be due to atelectasis or pneumonia. Electronically Signed   By: Inge Rise M.D.   On: 07/10/2015 07:21    EKG:  Orders placed or performed during the hospital encounter of 07/09/15  . EKG 12-Lead  . EKG 12-Lead    ASSESSMENT AND PLAN:  Active Problems:   Pulmonary edema #1. Acute pulmonary edema due to dietary indiscretions, continue dialysis per nephrology's recommendations, weaning off oxygen. The patient is off BiPAP, but is not on oxygen at home. Repeat chest x-ray in the morning.  #2. Essential hypertension, poorly controlled, likely due to fluid overload, advance losartan as needed, continue clonidine as well as hydralazine as needed #3. Elevated troponin, likely demand ischemia, get echocardiogram, continue metoprolol #4. History of DVT, on chronic contact ablation of his warfarin, INR is therapeutic #5. Leukocytosis, stable, get urine, sputum cultures, initiate antibiotics if febrile Management plans discussed with the patient, family and they are in agreement.   DRUG ALLERGIES: No Known Allergies  CODE STATUS:     Code Status Orders        Start     Ordered   07/09/15 1436  Full code   Continuous     07/09/15 1444    Code Status History    Date Active Date Inactive Code Status Order ID Comments User Context   09/19/2014 11:47 AM 09/20/2014  6:25 PM Full Code [id number]  Dustin Flock, MD ED   08/29/2014  2:28 PM 08/30/2014  5:43 PM Full Code [id number]  Fritzi Mandes, MD Inpatient    Advance Directive Documentation        Most Recent Value   Type of Advance Directive  Healthcare Power of Attorney, Living will   Pre-existing out of facility DNR order (yellow form or pink MOST  form)     "MOST" Form in Place?        TOTAL TIME TAKING CARE OF THIS PATIENT: 45 minutes.    Theodoro Grist M.D on 07/10/2015 at 6:19 PM  Between 7am to 6pm - Pager - [phone]  After 6pm go to [email or web address] - password [email or web address]  [facility]  Office  [phone]  CC: Primary care physician; Murlean Iba, MD

## 2015-07-10 NOTE — Progress Notes (Signed)
Patient in no distress at this time. Decreased oxygen to 2liters via nasal cannula. bipap remains on standby. Patient has stated that she was under the impression that she did not have to wear bipap any longer. She does not wear oxygen or bipap at home. She has been receiving dialysis which has resolved the problem she had with her breathing.  bipap remains on standby. Will leave at bedside in the event patient should need to go back on.  She has declined. States her breathing is fine. Updated RN

## 2015-07-10 NOTE — Progress Notes (Signed)
Spoke to Dr. Holley Raring on the phone about whether or not patient will receive dialysis today to know if RN should patient's blood pressure medicine. MD still uncertain about dialysis for patient but ordered RN to hold hypertensives for potential dialysis.

## 2015-07-10 NOTE — Progress Notes (Signed)
Subjective:  Patient had hemodialysis yesterday. Respiratory distress has improved significantly. She is due for dialysis again today.   Objective:  Vital signs in last 24 hours:  Temp:  [96.7 F (35.9 C)-99.2 F (37.3 C)] 99.2 F (37.3 C) (07/03 1200) Pulse Rate:  [54-111] 85 (07/03 1400) Resp:  [18-42] 23 (07/03 1400) BP: (75-194)/(60-140) 114/71 mmHg (07/03 1400) SpO2:  [93 %-100 %] 100 % (07/03 1400) FiO2 (%):  [36 %-40 %] 36 % (07/03 0820) Weight:  [69.4 kg (153 lb)-75.9 kg (167 lb 5.3 oz)] 75.9 kg (167 lb 5.3 oz) (07/03 0558)  Weight change:  Filed Weights   07/09/15 1740 07/09/15 2130 07/10/15 0558  Weight: 75.3 kg (166 lb 0.1 oz) 74.5 kg (164 lb 3.9 oz) 75.9 kg (167 lb 5.3 oz)    Intake/Output:    Intake/Output Summary (Last 24 hours) at 07/10/15 1451 Last data filed at 07/09/15 2219  Gross per 24 hour  Intake    100 ml  Output   1053 ml  Net   -953 ml     Physical Exam: General: No acute distress  HEENT anicteric  Neck supple  Pulm/lungs Basilar rales, normal effort  CVS/Heart S1S2 no rubs  Abdomen:  Soft, NT, non distended  Extremities: No peripheral edema  Neurologic: Alert, able to answer questions  Skin: No acute rashes  Access: Rt IJ PC       Basic Metabolic Panel:   Recent Labs Lab 07/09/15 1245 07/09/15 1500 07/09/15 1755 07/10/15 0224  NA 140  --  138 138  K 4.9  --  4.6 4.8  CL 105  --  101 99*  CO2 23  --  26 28  GLUCOSE 122*  --  143* 96  BUN 51*  --  38* 30*  CREATININE 9.15* 9.28* 6.71* 5.88*  CALCIUM 8.2*  --  8.0* 7.9*  MG  --   --  1.9 1.7  PHOS  --   --  4.5  --      CBC:  Recent Labs Lab 07/09/15 1245 07/09/15 1700 07/10/15 0224  WBC 13.4* 14.2* 14.3*  NEUTROABS 8.3*  --   --   HGB 11.8* 12.3 10.9*  HCT 36.2 36.9 32.6*  MCV 86.9 85.6 84.4  PLT 226 213 116*      Microbiology:  Recent Results (from the past 720 hour(s))  MRSA PCR Screening     Status: None   Collection Time: 07/09/15  4:21 PM   Result Value Ref Range Status   MRSA by PCR NEGATIVE NEGATIVE Final    Comment:        The GeneXpert MRSA Assay (FDA approved for NASAL specimens only), is one component of a comprehensive MRSA colonization surveillance program. It is not intended to diagnose MRSA infection nor to guide or monitor treatment for MRSA infections.     Coagulation Studies:  Recent Labs  07/09/15 1245 07/10/15 0224  LABPROT 22.2* 24.7*  INR 1.96 2.26    Urinalysis: No results for input(s): COLORURINE, LABSPEC, PHURINE, GLUCOSEU, HGBUR, BILIRUBINUR, KETONESUR, PROTEINUR, UROBILINOGEN, NITRITE, LEUKOCYTESUR in the last 72 hours.  Invalid input(s): APPERANCEUR    Imaging: Dg Chest 1 View  07/09/2015  CLINICAL DATA:  Respiratory distress EXAM: CHEST 1 VIEW COMPARISON:  08/30/2014 FINDINGS: 1253 hours. Diffuse interstitial and central alveolar opacity compatible with edema. Cardiopericardial silhouette is at upper limits of normal for size. Right IJ dialysis catheter tip overlies the distal SVC. Telemetry leads overlie the chest. IMPRESSION: Imaging features compatible with  pulmonary edema. Electronically Signed   By: Misty Stanley M.D.   On: 07/09/2015 13:06   Dg Chest Port 1 View  07/10/2015  CLINICAL DATA:  Patient with history of end-stage renal disease presenting 07/09/2015 with acute pulmonary edema and hypertensive urgency. EXAM: PORTABLE CHEST 1 VIEW COMPARISON:  Single-view of the chest 07/09/2015, 08/30/2014 and 01/16/2014. FINDINGS: The patient is rotated on the examination. Dialysis catheter is again seen. Interstitial pulmonary edema seen on the most recent examination is improved. There is airspace disease in the lung bases, more notable on the right. No pneumothorax or pleural effusion. No focal bony abnormality. IMPRESSION: Improved pulmonary edema. Right worse than left basilar airspace disease could be due to atelectasis or pneumonia. Electronically Signed   By: Inge Rise M.D.   On:  07/10/2015 07:21     Medications:     . antiseptic oral rinse  7 mL Mouth Rinse BID  . docusate sodium  100 mg Oral BID  . insulin aspart  2-6 Units Subcutaneous Q4H  . losartan  50 mg Oral Daily  . metoprolol tartrate  100 mg Oral BID  . pantoprazole  40 mg Oral Daily  . sertraline  100 mg Oral Daily  . sevelamer carbonate  800 mg Oral TID WC  . warfarin  5 mg Oral Once per day on Sun Fri Sat  . warfarin  7.5 mg Oral Once per day on Mon Tue Wed Thu  . Warfarin - Pharmacist Dosing Inpatient   Does not apply q1800   sodium chloride, acetaminophen, ALPRAZolam, clonazePAM, cloNIDine, hydrALAZINE, ondansetron (ZOFRAN) IV, oxyCODONE-acetaminophen, zolpidem  Assessment/ Plan:  60 y.o.african [place] female with end-stage renal disease, hypertension, cardiomyopathy, chronic systolic congestive heart failure with EF of [id number], grade 1 diastolic dysfuncion presents for shortness of breath and is diagnosed with pulmonary edema. Similar presentation in sep 2016  1. End-stage renal disease. [facility] nephrology. [employer]. M-W-F 2. Acute pulmonary edema requiring NIPPV 3. Anemia of chronic kidney disease 4. Secondary hyperparathyroidism 5. Hypertension, well controlled.  Plan: Patient completed hemodialysis yesterday.  She is due for dialysis per her usual schedule today.  We will plan for ultrafiltration target of 2 kg.  Overallher acute pulmonary edema has improved.  Hemoglobin currently 10.9.  Hold off on Epogen for now.  We will also maintain the patient on sevelamer 800 mg by mouth 3 times a day for control of her secondary hyperparathyroidism.  Hypertension also appears to be well-controlled therefore we will continue current doses of losartan and metoprolol.  LOS: 1 Orby Tangen 7/3/20172:51 PM

## 2015-07-10 NOTE — Progress Notes (Signed)
Heart Failure Clinic appointment scheduled for July 27, 2015 at 10:30am. Thank you.

## 2015-07-10 NOTE — Progress Notes (Deleted)
Lab reports critical INR of 4.04.  Informed Dorene Sorrow, NP of result.  Will continue to monitor.

## 2015-07-10 NOTE — Progress Notes (Signed)
Tx complete  

## 2015-07-10 NOTE — Care Management (Signed)
presents from home with respiratory distress.  ESRD with dialysis M [facility].  Last HD treatment prior to admission was Friday.  CXR shows pulmonary edema.  Admitted to icu  stepdown due to need for continuous bipap

## 2015-07-11 ENCOUNTER — Inpatient Hospital Stay: Payer: Medicare Other

## 2015-07-11 ENCOUNTER — Inpatient Hospital Stay (HOSPITAL_COMMUNITY)
Admit: 2015-07-11 | Discharge: 2015-07-11 | Disposition: A | Discharge status: Home / Self Care | Payer: Medicare Other | Attending: Internal Medicine | Admitting: Internal Medicine

## 2015-07-11 DIAGNOSIS — R7989 Other specified abnormal findings of blood chemistry: Secondary | ICD-10-CM

## 2015-07-11 LAB — ECHOCARDIOGRAM COMPLETE
AV pk vel: 178 cm/s
AVPG: 13 mmHg
Ao-asc: 42 cm
FS: 25 % — AB (ref 28–44)
HEIGHTINCHES: 67 in
IV/PV OW: 1.12
LA ID, A-P, ES: 19 mm
LA vol index: 34.7 mL/m2
LADIAMINDEX: 1.03 cm/m2
LAVOL: 64.2 mL
LAVOLA4C: 70.5 mL
LEFT ATRIUM END SYS DIAM: 19 mm
LV PW d: 12 mm — AB (ref 0.6–1.1)
LVOT area: 3.8 cm2
LVOT diameter: 22 mm
TVMG: 252 mmHg
WEIGHTICAEL: 2585.55 [oz_av]

## 2015-07-11 LAB — GLUCOSE, CAPILLARY
GLUCOSE-CAPILLARY: 81 mg/dL (ref 65–99)
GLUCOSE-CAPILLARY: 96 mg/dL (ref 65–99)
GLUCOSE-CAPILLARY: 111 mg/dL — AB (ref 65–99)
GLUCOSE-CAPILLARY: 128 mg/dL — AB (ref 65–99)
Glucose-Capillary: 109 mg/dL — ABNORMAL HIGH (ref 65–99)
Glucose-Capillary: 137 mg/dL — ABNORMAL HIGH (ref 65–99)

## 2015-07-11 LAB — HEPATITIS B CORE ANTIBODY, TOTAL: Hep B Core Total Ab: NEGATIVE

## 2015-07-11 LAB — PROTIME-INR
INR: 3.59
PROTHROMBIN TIME: 35 s — AB (ref 11.4–15.0)

## 2015-07-11 LAB — HEPATITIS B SURFACE ANTIGEN: HEP B S AG: NEGATIVE

## 2015-07-11 LAB — HEPATITIS B SURFACE ANTIBODY, QUANTITATIVE: Hepatitis B-Post: 23.9 m[IU]/mL (ref >9.9)

## 2015-07-11 MED ORDER — METOPROLOL TARTRATE 50 MG PO TABS
50.0000 mg | ORAL_TABLET | Freq: Two times a day (BID) | ORAL | Status: DC
  Administered 2015-07-11: 50 mg via ORAL
  Filled 2015-07-11: qty 1

## 2015-07-11 NOTE — Progress Notes (Signed)
Subjective:  Patient completed dialysis yesterday. Tolerated quite well. Breathing comfortably this a.m.   Objective:  Vital signs in last 24 hours:  Temp:  [98.4 F (36.9 C)-99.3 F (37.4 C)] 99 F (37.2 C) (07/04 0701) Pulse Rate:  [33-99] 95 (07/04 0928) Resp:  [17-30] 17 (07/04 0700) BP: (87-155)/(50-95) 115/60 mmHg (07/04 0928) SpO2:  [92 %-100 %] 99 % (07/04 0700) Weight:  [73.3 kg (161 lb 9.6 oz)-76.3 kg (168 lb 3.4 oz)] 73.3 kg (161 lb 9.6 oz) (07/04 0557)  Weight change: 6.9 kg (15 lb 3.4 oz) Filed Weights   07/10/15 0558 07/10/15 1508 07/11/15 0557  Weight: 75.9 kg (167 lb 5.3 oz) 76.3 kg (168 lb 3.4 oz) 73.3 kg (161 lb 9.6 oz)    Intake/Output:    Intake/Output Summary (Last 24 hours) at 07/11/15 1002 Last data filed at 07/11/15 0921  Gross per 24 hour  Intake    640 ml  Output   2000 ml  Net  -1360 ml     Physical Exam: General: No acute distress  HEENT anicteric  Neck supple  Pulm/lungs Mild basilar rales, normal effort  CVS/Heart S1S2 no rubs  Abdomen:  Soft, NT, non distended  Extremities: No peripheral edema  Neurologic: Alert, able to answer questions  Skin: No acute rashes  Access: Rt IJ PC       Basic Metabolic Panel:   Recent Labs Lab 07/09/15 1245 07/09/15 1500 07/09/15 1755 07/10/15 0224  NA 140  --  138 138  K 4.9  --  4.6 4.8  CL 105  --  101 99*  CO2 23  --  26 28  GLUCOSE 122*  --  143* 96  BUN 51*  --  38* 30*  CREATININE 9.15* 9.28* 6.71* 5.88*  CALCIUM 8.2*  --  8.0* 7.9*  MG  --   --  1.9 1.7  PHOS  --   --  4.5  --      CBC:  Recent Labs Lab 07/09/15 1245 07/09/15 1700 07/10/15 0224  WBC 13.4* 14.2* 14.3*  NEUTROABS 8.3*  --   --   HGB 11.8* 12.3 10.9*  HCT 36.2 36.9 32.6*  MCV 86.9 85.6 84.4  PLT 226 213 116*      Microbiology:  Recent Results (from the past 720 hour(s))  MRSA PCR Screening     Status: None   Collection Time: 07/09/15  4:21 PM  Result Value Ref Range Status   MRSA by PCR  NEGATIVE NEGATIVE Final    Comment:        The GeneXpert MRSA Assay (FDA approved for NASAL specimens only), is one component of a comprehensive MRSA colonization surveillance program. It is not intended to diagnose MRSA infection nor to guide or monitor treatment for MRSA infections.     Coagulation Studies:  Recent Labs  07/09/15 1245 07/10/15 0224 07/11/15 0601  LABPROT 22.2* 24.7* 35.0*  INR 1.96 2.26 3.59    Urinalysis: No results for input(s): COLORURINE, LABSPEC, PHURINE, GLUCOSEU, HGBUR, BILIRUBINUR, KETONESUR, PROTEINUR, UROBILINOGEN, NITRITE, LEUKOCYTESUR in the last 72 hours.  Invalid input(s): APPERANCEUR    Imaging: Dg Chest 1 View  07/09/2015  CLINICAL DATA:  Respiratory distress EXAM: CHEST 1 VIEW COMPARISON:  08/30/2014 FINDINGS: 1253 hours. Diffuse interstitial and central alveolar opacity compatible with edema. Cardiopericardial silhouette is at upper limits of normal for size. Right IJ dialysis catheter tip overlies the distal SVC. Telemetry leads overlie the chest. IMPRESSION: Imaging features compatible with pulmonary edema. Electronically Signed  By: Misty Stanley M.D.   On: 07/09/2015 13:06   Dg Chest Port 1 View  07/11/2015  CLINICAL DATA:  Pulmonary edema EXAM: PORTABLE CHEST 1 VIEW COMPARISON:  07/10/2015 FINDINGS: Right dialysis catheter remains in place, unchanged. Heart is normal size. No confluent airspace opacities or effusions. No acute bony abnormality. IMPRESSION: No active disease. Electronically Signed   By: Rolm Baptise M.D.   On: 07/11/2015 07:49   Dg Chest Port 1 View  07/10/2015  CLINICAL DATA:  Patient with history of end-stage renal disease presenting 07/09/2015 with acute pulmonary edema and hypertensive urgency. EXAM: PORTABLE CHEST 1 VIEW COMPARISON:  Single-view of the chest 07/09/2015, 08/30/2014 and 01/16/2014. FINDINGS: The patient is rotated on the examination. Dialysis catheter is again seen. Interstitial pulmonary edema seen on  the most recent examination is improved. There is airspace disease in the lung bases, more notable on the right. No pneumothorax or pleural effusion. No focal bony abnormality. IMPRESSION: Improved pulmonary edema. Right worse than left basilar airspace disease could be due to atelectasis or pneumonia. Electronically Signed   By: Inge Rise M.D.   On: 07/10/2015 07:21     Medications:     . antiseptic oral rinse  7 mL Mouth Rinse BID  . docusate sodium  100 mg Oral BID  . insulin aspart  2-6 Units Subcutaneous Q4H  . losartan  50 mg Oral Daily  . metoprolol tartrate  50 mg Oral BID  . pantoprazole  40 mg Oral Daily  . sertraline  100 mg Oral Daily  . sevelamer carbonate  800 mg Oral TID WC  . Warfarin - Pharmacist Dosing Inpatient   Does not apply q1800   sodium chloride, acetaminophen, ALPRAZolam, clonazePAM, cloNIDine, hydrALAZINE, ondansetron (ZOFRAN) IV, oxyCODONE-acetaminophen, zolpidem  Assessment/ Plan:  60 y.o.african [place] female with end-stage renal disease, hypertension, cardiomyopathy, chronic systolic congestive heart failure with EF of [id number], grade 1 diastolic dysfuncion presents for shortness of breath and is diagnosed with pulmonary edema. Similar presentation in sep 2016  1. End-stage renal disease. [facility] nephrology. [employer]. M-W-F 2. Acute pulmonary edema requiring NIPPV 3. Anemia of chronic kidney disease 4. Secondary hyperparathyroidism 5. Hypertension, well controlled.  Plan: Patient had dialysis the past 2 days. Doing well at the moment. No urgent indication for dialysis today. We will plan for dialysis again tomorrow. Pulmonary edema improved significantly.  Continue to hold Epogen for now. Continue Renvela for secondary hyperparathyroidism.  We will continue to monitor the patient's progress.   LOS: 2 Kendrick Haapala 7/4/201710:02 AM

## 2015-07-11 NOTE — Progress Notes (Signed)
Pateitn has walked again and sat out of bed for long periods of time and states she generally feels better. However, when she goes back to sleep her sats drop to 88-89 % and so repalced on o2 at 1 L . Resting comfortably at this time, transferred to floor, report called to RN

## 2015-07-11 NOTE — Care Management (Signed)
Spoke with primary nurse regarding the need for assessment of need for home 02.  Patient had discharge order but was cancelled due to tachycardia and shortness of breath with exertion.  To transfer out of icu

## 2015-07-11 NOTE — Progress Notes (Signed)
*  PRELIMINARY RESULTS* Echocardiogram 2D Echocardiogram has been performed.  Teresa Cook 07/11/2015, 9:23 AM

## 2015-07-11 NOTE — Progress Notes (Addendum)
[facility] [place] NAME: Teresa Cook    MR#:  [id number]  DATE OF BIRTH:  June 13, 1955  SUBJECTIVE:  CHIEF COMPLAINT:   Chief Complaint  Patient presents with  . Respiratory Distress  Patient is 60 year old African American female with a several history significant for history of end-stage renal disease, hemodialysis dependent, who never missed a day of dialysis during all her life, who presented to the hospital with complaints of severe shortness of breath, chest x-ray revealed pulmonary edema, acute congestive heart failure. Blood pressure was highly elevated to 170s. Initial chest x-ray was consistent with pulmonary edema, no pneumonia. Initially, however, atelectasis versus pneumonia was noted on the right and today's chest x-ray. Patient admits of some cough with no significant sputum production, she has improved now on serial dialysis sessions, weaned off BiPAP and is on oxygen at 3 L of oxygen via nasal cannulas, not on oxygen at home at all  Had some barbeque last week end, with lot of salt in it.   Review of Systems  Constitutional: Positive for chills. Negative for fever, weight loss and malaise/fatigue.  HENT: Negative for congestion.   Eyes: Negative for blurred vision and double vision.  Respiratory: Positive for cough and shortness of breath. Negative for sputum production and wheezing.   Cardiovascular: Positive for chest pain and palpitations. Negative for orthopnea, leg swelling and PND.  Gastrointestinal: Negative for nausea, vomiting, abdominal pain, diarrhea, constipation, blood in stool and melena.  Genitourinary: Negative for dysuria, urgency, frequency and hematuria.  Musculoskeletal: Negative for falls.  Skin: Negative for rash.  Neurological: Positive for weakness. Negative for dizziness.  Psychiatric/Behavioral: Negative for depression and memory loss. The patient is not nervous/anxious.     VITAL  SIGNS: Blood pressure 112/60, pulse 83, temperature 98.5 F (36.9 C), temperature source Oral, resp. rate 18, height 5\' 7"  (1.702 m), weight 73.3 kg (161 lb 9.6 oz), SpO2 100 %.  PHYSICAL EXAMINATION:   GENERAL:  60 y.o.-year-old patient lying in the bed with no acute distress. Permanent hemodialysis catheter in the right chest EYES: Pupils equal, round, reactive to light and accommodation. No scleral icterus. Extraocular muscles intact.  HEENT: Head atraumatic, normocephalic. Oropharynx and nasopharynx clear.  NECK:  Supple, no jugular venous distention. No thyroid enlargement, no tenderness.  LUNGS: Diminished breath sounds bilaterally, no wheezing, rales,rhonchi or crepitation. No use of accessory muscles of respiration.  CARDIOVASCULAR: S1, S2 normal. No murmurs, rubs, or gallops.  ABDOMEN: Soft, nontender, nondistended. Bowel sounds present. No organomegaly or mass.  EXTREMITIES: No pedal edema, cyanosis, or clubbing.  NEUROLOGIC: Cranial nerves II through XII are intact. Muscle strength 5/5 in all extremities. Sensation intact. Gait not checked.  PSYCHIATRIC: The patient is alert and oriented x 3.  SKIN: No obvious rash, lesion, or ulcer.   ORDERS/RESULTS REVIEWED:   CBC  Recent Labs Lab 07/09/15 1245 07/09/15 1700 07/10/15 0224  WBC 13.4* 14.2* 14.3*  HGB 11.8* 12.3 10.9*  HCT 36.2 36.9 32.6*  PLT 226 213 116*  MCV 86.9 85.6 84.4  MCH 28.4 28.5 28.3  MCHC 32.6 33.3 33.5  RDW 15.9* 15.8* 15.7*  LYMPHSABS 4.0*  --   --   MONOABS 0.6  --   --   EOSABS 0.3  --   --   BASOSABS 0.1  --   --    ------------------------------------------------------------------------------------------------------------------  Chemistries   Recent Labs Lab 07/09/15 1245 07/09/15 1500 07/09/15 1755 07/10/15 0224  NA 140  --  138 138  K 4.9  --  4.6 4.8  CL 105  --  101 99*  CO2 23  --  26 28  GLUCOSE 122*  --  143* 96  BUN 51*  --  38* 30*  CREATININE 9.15* 9.28* 6.71* 5.88*   CALCIUM 8.2*  --  8.0* 7.9*  MG  --   --  1.9 1.7   ------------------------------------------------------------------------------------------------------------------ estimated creatinine clearance is 9.8 mL/min (by C-G formula based on Cr of 5.88). ------------------------------------------------------------------------------------------------------------------ No results for input(s): TSH, T4TOTAL, T3FREE, THYROIDAB in the last 72 hours.  Invalid input(s): FREET3  Cardiac Enzymes  Recent Labs Lab 07/09/15 1500 07/09/15 2120 07/10/15 0224  TROPONINI 0.04* 0.22* 0.35*   ------------------------------------------------------------------------------------------------------------------ Invalid input(s): POCBNP ---------------------------------------------------------------------------------------------------------------  RADIOLOGY: Dg Chest Port 1 View  07/11/2015  CLINICAL DATA:  Pulmonary edema EXAM: PORTABLE CHEST 1 VIEW COMPARISON:  07/10/2015 FINDINGS: Right dialysis catheter remains in place, unchanged. Heart is normal size. No confluent airspace opacities or effusions. No acute bony abnormality. IMPRESSION: No active disease. Electronically Signed   By: Rolm Baptise M.D.   On: 07/11/2015 07:49   Dg Chest Port 1 View  07/10/2015  CLINICAL DATA:  Patient with history of end-stage renal disease presenting 07/09/2015 with acute pulmonary edema and hypertensive urgency. EXAM: PORTABLE CHEST 1 VIEW COMPARISON:  Single-view of the chest 07/09/2015, 08/30/2014 and 01/16/2014. FINDINGS: The patient is rotated on the examination. Dialysis catheter is again seen. Interstitial pulmonary edema seen on the most recent examination is improved. There is airspace disease in the lung bases, more notable on the right. No pneumothorax or pleural effusion. No focal bony abnormality. IMPRESSION: Improved pulmonary edema. Right worse than left basilar airspace disease could be due to atelectasis or  pneumonia. Electronically Signed   By: Inge Rise M.D.   On: 07/10/2015 07:21    EKG:  Orders placed or performed during the hospital encounter of 07/09/15  . EKG 12-Lead  . EKG 12-Lead    ASSESSMENT AND PLAN:  Active Problems:   Pulmonary edema #1. Acute pulmonary edema due to dietary indiscretions, continue dialysis per nephrology's recommendations, weaning off oxygen. The patient is off BiPAP, but is not on oxygen at home.    She had desaturation on walking.  #2. Essential hypertension, poorly controlled, likely due to fluid overload, advance losartan as needed, continue clonidine and hydralazine as needed #3. Elevated troponin, likely demand ischemia, get echocardiogram, continue metoprolol #4. History of DVT, on chronic contact ablation of his warfarin, INR is therapeutic #5. Leukocytosis, stable,    No fever. #6 ESRD on HD   Cont per nephrology. #7 Hx of DVT    On coumadin    INR is high, manage per pharmacy- hold for one day.  DRUG ALLERGIES: No Known Allergies  CODE STATUS:     Code Status Orders        Start     Ordered   07/09/15 1436  Full code   Continuous     07/09/15 1444    Code Status History    Date Active Date Inactive Code Status Order ID Comments User Context   09/19/2014 11:47 AM 09/20/2014  6:25 PM Full Code [id number]  Dustin Flock, MD ED   08/29/2014  2:28 PM 08/30/2014  5:43 PM Full Code [id number]  Fritzi Mandes, MD Inpatient    Advance Directive Documentation        Most Recent Value   Type of Advance Directive  Healthcare Power of [place], Living will  Pre-existing out of facility DNR order (yellow form or pink MOST form)     "MOST" Form in Place?        TOTAL TIME TAKING CARE OF THIS PATIENT: 35 minutes.  She had hypoxia on ambulation, will monitor on medical floor and possible d/c tomorrow.  [name] M.D on 07/11/2015 at 3:01 PM  Between 7am to 6pm - Pager - [phone]  After 6pm go to [email or web address] - password  [facility]  Office  [phone]  CC: Primary care physician; Murlean Iba, MD

## 2015-07-11 NOTE — Consult Note (Signed)
ANTICOAGULATION CONSULT NOTE - Follow Up Consult  Pharmacy Consult for wafarin Indication:Hx of DVT  No Known Allergies  Patient Measurements: Height: 5\' 7"  (170.2 cm) Weight: 161 lb 9.6 oz (73.3 kg) IBW/kg (Calculated) : 61.6  Vital Signs: Temp: 99 F (37.2 C) (07/04 0701) BP: 115/60 mmHg (07/04 0928) Pulse Rate: 95 (07/04 0928)  Labs:  Recent Labs  07/09/15 1245 07/09/15 1500 07/09/15 1700 07/09/15 1755 07/09/15 2120 07/10/15 0224 07/11/15 0601  HGB 11.8*  --  12.3  --   --  10.9*  --   HCT 36.2  --  36.9  --   --  32.6*  --   PLT 226  --  213  --   --  116*  --   LABPROT 22.2*  --   --   --   --  24.7* 35.0*  INR 1.96  --   --   --   --  2.26 3.59  CREATININE 9.15* 9.28*  --  6.71*  --  5.88*  --   TROPONINI <0.03 0.04*  --   --  0.22* 0.35*  --     Estimated Creatinine Clearance: 9.8 mL/min (by C-G formula based on Cr of 5.88).   Medical History: Past Medical History  Diagnosis Date  . Hypertension   . Parathyroid disease ([place])   . DVT (deep venous thrombosis) ([facility])     left leg  . CHF (congestive heart failure) ([place])   . Dialysis patient ([place])   . HTN (hypertension)   . Parathyroid abnormality ([place])   . Depression   . Anxiety   . Psoriasis   . Gout   . DVT (deep venous thrombosis) ([place])   . Chronic kidney disease     Medications:  Scheduled:  . antiseptic oral rinse  7 mL Mouth Rinse BID  . docusate sodium  100 mg Oral BID  . insulin aspart  2-6 Units Subcutaneous Q4H  . losartan  50 mg Oral Daily  . metoprolol tartrate  100 mg Oral BID  . pantoprazole  40 mg Oral Daily  . sertraline  100 mg Oral Daily  . sevelamer carbonate  800 mg Oral TID WC  . Warfarin - Pharmacist Dosing Inpatient   Does not apply q1800    Assessment: Pt is a 60 year old female with a hx of DVT on warfarin at home. Pt home dose of warfarin is 7.5mg  M-Thus and 5mg  F, Sat, Sun. INR on admission is slightly subtherapeutic at 1.96.    7/2  INR 1.96  Warfarin 5mg   given 7/3  INR 2.56  Warfarin 7.5mg  given 7/4  INR 3.59   Goal of Therapy:  INR 2-3 Monitor platelets by anticoagulation protocol: Yes   Plan:  INR supratherapeutic on home dose. Will discontinue Coumadin orders and f/u AM INR.   Olivia Canter, [facility] Clinical Pharmacist  07/11/2015,9:43 AM

## 2015-07-11 NOTE — Progress Notes (Signed)
Md in to see patient and orders received to DC pt home. Pt sat out of bed and went for a short walk - 30 ft, pt co inc SOB, helped back to chair. Pt complained of "not feeling well" , HR up to 118, BP 97/40, o2 sats 96 % on room air. Waited 15 minutes and reasessed, lungs clear, pt states feeling better now, but sats decreased to 89% on RA. O2 reapplied at 1 L . Md notified and ordered to DC on hold and pt now to stay overnight

## 2015-07-12 LAB — GLUCOSE, CAPILLARY
GLUCOSE-CAPILLARY: 96 mg/dL (ref 65–99)
GLUCOSE-CAPILLARY: 97 mg/dL (ref 65–99)
Glucose-Capillary: 76 mg/dL (ref 65–99)

## 2015-07-12 LAB — PROTIME-INR
INR: 3.14
PROTHROMBIN TIME: 31.7 s — AB (ref 11.4–15.0)

## 2015-07-12 LAB — PHOSPHORUS: Phosphorus: 4.6 mg/dL (ref 2.5–4.6)

## 2015-07-12 MED ORDER — WARFARIN SODIUM 5 MG PO TABS
5.0000 mg | ORAL_TABLET | Freq: Once | ORAL | Status: DC
  Filled 2015-07-12: qty 1

## 2015-07-12 MED ORDER — WARFARIN SODIUM 5 MG PO TABS: 5.0000 mg | ORAL_TABLET | Freq: Every day | ORAL | Status: DC

## 2015-07-12 NOTE — Discharge Summary (Signed)
[facility] NAME: Teresa Cook    MR#:  [id number]  DATE OF BIRTH:  Apr 08, 1955  DATE OF ADMISSION:  07/09/2015 ADMITTING PHYSICIAN: [name], MD  DATE OF DISCHARGE: 07/12/2015  PRIMARY CARE PHYSICIAN: Murlean Iba, MD    ADMISSION DIAGNOSIS:  Flash pulmonary edema ([place]) [J81.0]  DISCHARGE DIAGNOSIS:  Active Problems:   Pulmonary edema   SECONDARY DIAGNOSIS:   Past Medical History  Diagnosis Date  . Hypertension   . Parathyroid disease ([place])   . DVT (deep venous thrombosis) ([facility])     left leg  . CHF (congestive heart failure) ([place])   . Dialysis patient ([place])   . HTN (hypertension)   . Parathyroid abnormality ([place])   . Depression   . Anxiety   . Psoriasis   . Gout   . DVT (deep venous thrombosis) ([place])   . Chronic kidney disease     HOSPITAL COURSE:   #1. Acute pulmonary edema due to dietary indiscretions, continue dialysis per nephrology's recommendations, weaning off oxygen. The patient is off BiPAP, but is not on oxygen at home.   She had desaturation on walking. so kept one more day, and she felt fine next day , walked without hypoxia. #2. Essential hypertension, poorly controlled, likely due to fluid overload, advance losartan as needed, continue clonidine and hydralazine as needed.   Better controlled after hemodialysis. #3. Elevated troponin, likely demand ischemia, get echocardiogram, continue metoprolol #4. History of DVT, on chronic contact ablation of his warfarin, INR is therapeutic #5. Leukocytosis, stable,   No fever. #6 ESRD on HD  Cont per nephrology. #7 Hx of DVT  On coumadin  INR is high, manage per pharmacy- hold for one day.    Decreasing dose- advised to check INR with PMD in 1 week.  DISCHARGE CONDITIONS:   Stable.  CONSULTS OBTAINED:  Treatment Team:  Murlean Iba, MD  DRUG ALLERGIES:  No Known Allergies  DISCHARGE MEDICATIONS:   Current  Discharge Medication List    CONTINUE these medications which have CHANGED   Details  warfarin (COUMADIN) 5 MG tablet Take 1 tablet (5 mg total) by mouth daily. Dose changed as INR was high. Qty: 20 tablet, Refills: 0      CONTINUE these medications which have NOT CHANGED   Details  ALPRAZolam (XANAX) 0.5 MG tablet Take 0.5 mg by mouth daily as needed for anxiety.    !! cinacalcet (SENSIPAR) 30 MG tablet Take 30 mg by mouth every morning. *Take along with 60 mg tablet to equal 90 mg total dose*    !! cinacalcet (SENSIPAR) 60 MG tablet Take 60 mg by mouth daily. *Take along with 30 mg tablet to equal 90 mg total dose*    clonazePAM (KLONOPIN) 0.5 MG tablet Take 0.25 mg by mouth daily. Taken before dialysis    losartan (COZAAR) 50 MG tablet Take 50 mg by mouth daily.    metoprolol (LOPRESSOR) 100 MG tablet Take 50 mg by mouth 2 (two) times daily.    pantoprazole (PROTONIX) 20 MG tablet Take 20 mg by mouth daily.    sertraline (ZOLOFT) 100 MG tablet Take 100 mg by mouth daily.    sevelamer carbonate (RENVELA) 800 MG tablet Take 2,400 mg by mouth 3 (three) times daily with meals.     traMADol (ULTRAM) 50 MG tablet Take 50 mg by mouth every 8 (eight) hours as needed for moderate pain or severe pain.     zolpidem (AMBIEN)  10 MG tablet Take 10 mg by mouth at bedtime as needed for sleep.    furosemide (LASIX) 20 MG tablet Take 1 tablet (20 mg total) by mouth daily. Qty: 30 tablet, Refills: 0    oxyCODONE-acetaminophen (PERCOCET) 7.5-325 MG tablet Take 1 tablet by mouth every 4 (four) hours as needed for severe pain. Qty: 20 tablet, Refills: 0     !! - Potential duplicate medications found. Please discuss with provider.    STOP taking these medications     clindamycin (CLEOCIN) 150 MG capsule          DISCHARGE INSTRUCTIONS:    Follow with PMD in 1 week.  If you experience worsening of your admission symptoms, develop shortness of breath, life threatening emergency,  suicidal or homicidal thoughts you must seek medical attention immediately by calling 911 or calling your MD immediately  if symptoms less severe.  You Must read complete instructions/literature along with all the possible adverse reactions/side effects for all the Medicines you take and that have been prescribed to you. Take any new Medicines after you have completely understood and accept all the possible adverse reactions/side effects.   Please note  You were cared for by a hospitalist during your hospital stay. If you have any questions about your discharge medications or the care you received while you were in the hospital after you are discharged, you can call the unit and asked to speak with the hospitalist on call if the hospitalist that took care of you is not available. Once you are discharged, your primary care physician will handle any further medical issues. Please note that NO REFILLS for any discharge medications will be authorized once you are discharged, as it is imperative that you return to your primary care physician (or establish a relationship with a primary care physician if you do not have one) for your aftercare needs so that they can reassess your need for medications and monitor your lab values.  Today   CHIEF COMPLAINT:   Chief Complaint  Patient presents with  . Respiratory Distress    HISTORY OF PRESENT ILLNESS:  Teresa Cook  is a 60 y.o. female with a known history of #1. Acute pulmonary edema due to dietary indiscretions, continue dialysis per nephrology's recommendations, weaning off oxygen. The patient is off BiPAP, but is not on oxygen at home.   She had desaturation on walking.  #2. Essential hypertension, poorly controlled, likely due to fluid overload, advance losartan as needed, continue clonidine and hydralazine as needed #3. Elevated troponin, likely demand ischemia, get echocardiogram, continue metoprolol #4. History of DVT, on chronic  contact ablation of his warfarin, INR is therapeutic #5. Leukocytosis, stable,   No fever. #6 ESRD on HD  Cont per nephrology. #7 Hx of DVT  On coumadin  INR is high, manage per pharmacy- hold for one day.   VITAL SIGNS:  Blood pressure 116/66, pulse 108, temperature 99 F (37.2 C), temperature source Oral, resp. rate 22, height 5\' 7"  (1.702 m), weight 70.6 kg (155 lb 10.3 oz), SpO2 99 %.  I/O:   Intake/Output Summary (Last 24 hours) at 07/12/15 1344 Last data filed at 07/12/15 0955  Gross per 24 hour  Intake    240 ml  Output      0 ml  Net    240 ml    PHYSICAL EXAMINATION:   #1. Acute pulmonary edema due to dietary indiscretions, continue dialysis per nephrology's recommendations, weaning off oxygen. The patient is off  BiPAP, but is not on oxygen at home.   She had desaturation on walking.  #2. Essential hypertension, poorly controlled, likely due to fluid overload, advance losartan as needed, continue clonidine and hydralazine as needed #3. Elevated troponin, likely demand ischemia, get echocardiogram, continue metoprolol #4. History of DVT, on chronic contact ablation of his warfarin, INR is therapeutic #5. Leukocytosis, stable,   No fever. #6 ESRD on HD  Cont per nephrology. #7 Hx of DVT  On coumadin  INR is high, manage per pharmacy- hold for one day.  DATA REVIEW:   CBC  Recent Labs Lab 07/10/15 0224  WBC 14.3*  HGB 10.9*  HCT 32.6*  PLT 116*    Chemistries   Recent Labs Lab 07/10/15 0224  NA 138  K 4.8  CL 99*  CO2 28  GLUCOSE 96  BUN 30*  CREATININE 5.88*  CALCIUM 7.9*  MG 1.7    Cardiac Enzymes  Recent Labs Lab 07/10/15 0224  TROPONINI 0.35*    Microbiology Results  Results for orders placed or performed during the hospital encounter of 07/09/15  MRSA PCR Screening     Status: None   Collection Time: 07/09/15  4:21 PM  Result Value Ref Range Status   MRSA by PCR NEGATIVE NEGATIVE Final    Comment:         The GeneXpert MRSA Assay (FDA approved for NASAL specimens only), is one component of a comprehensive MRSA colonization surveillance program. It is not intended to diagnose MRSA infection nor to guide or monitor treatment for MRSA infections.     RADIOLOGY:  Dg Chest Port 1 View  07/11/2015  CLINICAL DATA:  Pulmonary edema EXAM: PORTABLE CHEST 1 VIEW COMPARISON:  07/10/2015 FINDINGS: Right dialysis catheter remains in place, unchanged. Heart is normal size. No confluent airspace opacities or effusions. No acute bony abnormality. IMPRESSION: No active disease. Electronically Signed   By: Rolm Baptise M.D.   On: 07/11/2015 07:49    EKG:   Orders placed or performed during the hospital encounter of 07/09/15  . EKG 12-Lead  . EKG 12-Lead      Management plans discussed with the patient, family and they are in agreement.  CODE STATUS:     Code Status Orders        Start     Ordered   07/09/15 1436  Full code   Continuous     07/09/15 1444    Code Status History    Date Active Date Inactive Code Status Order ID Comments User Context   09/19/2014 11:47 AM 09/20/2014  6:25 PM Full Code [id number]  Dustin Flock, MD ED   08/29/2014  2:28 PM 08/30/2014  5:43 PM Full Code [id number]  Fritzi Mandes, MD Inpatient    Advance Directive Documentation        Most Recent Value   Type of Advance Directive  Healthcare Power of [place], Living will   Pre-existing out of facility DNR order (yellow form or pink MOST form)     "MOST" Form in Place?        TOTAL TIME TAKING CARE OF THIS PATIENT: 35 minutes.    [name] M.D on 07/12/2015 at 1:44 PM  Between 7am to 6pm - Pager - [phone]  After 6pm go to [email or web address] - password [email or web address] [facility]  Office  [phone]  CC: Primary care physician; Murlean Iba, MD   Note: This dictation was prepared with Dragon dictation along with smaller phrase technology. Any transcriptional errors  that  result from this process are unintentional.

## 2015-07-12 NOTE — Progress Notes (Signed)
Patient is discharge home after dialysis in a stable condition, summary and f/u care given to both pt and husband , verbalized understanding , left with husband

## 2015-07-12 NOTE — Progress Notes (Signed)
Pre dialysis  

## 2015-07-12 NOTE — Consult Note (Signed)
ANTICOAGULATION CONSULT NOTE - Follow Up Consult  Pharmacy Consult for wafarin Indication:Hx of DVT  No Known Allergies  Patient Measurements: Height: 5\' 7"  (170.2 cm) Weight: 155 lb 10.3 oz (70.6 kg) IBW/kg (Calculated) : 61.6  Vital Signs: Temp: 99 F (37.2 C) (07/05 1015) Temp Source: Oral (07/05 1015) BP: 130/80 mmHg (07/05 1200) Pulse Rate: 89 (07/05 1200)  Labs:  Recent Labs  07/09/15 1245 07/09/15 1500 07/09/15 1700 07/09/15 1755 07/09/15 2120 07/10/15 0224 07/11/15 0601 07/12/15 0359  HGB 11.8*  --  12.3  --   --  10.9*  --   --   HCT 36.2  --  36.9  --   --  32.6*  --   --   PLT 226  --  213  --   --  116*  --   --   LABPROT 22.2*  --   --   --   --  24.7* 35.0* 31.7*  INR 1.96  --   --   --   --  2.26 3.59 3.14  CREATININE 9.15* 9.28*  --  6.71*  --  5.88*  --   --   TROPONINI <0.03 0.04*  --   --  0.22* 0.35*  --   --     Estimated Creatinine Clearance: 9.8 mL/min (by C-G formula based on Cr of 5.88).   Medical History: Past Medical History  Diagnosis Date  . Hypertension   . Parathyroid disease ([place])   . DVT (deep venous thrombosis) ([facility])     left leg  . CHF (congestive heart failure) ([place])   . Dialysis patient ([place])   . HTN (hypertension)   . Parathyroid abnormality ([place])   . Depression   . Anxiety   . Psoriasis   . Gout   . DVT (deep venous thrombosis) ([place])   . Chronic kidney disease     Medications:  Scheduled:  . antiseptic oral rinse  7 mL Mouth Rinse BID  . docusate sodium  100 mg Oral BID  . insulin aspart  2-6 Units Subcutaneous Q4H  . losartan  50 mg Oral Daily  . metoprolol tartrate  50 mg Oral BID  . pantoprazole  40 mg Oral Daily  . sertraline  100 mg Oral Daily  . sevelamer carbonate  800 mg Oral TID WC  . Warfarin - Pharmacist Dosing Inpatient   Does not apply q1800    Assessment: Pt is a 60 year old female with a hx of DVT on warfarin at home. Pt home dose of warfarin is 7.5mg  M-Thus and 5mg  F, Sat, Sun. INR on  admission is slightly subtherapeutic at 1.96.    7/2  INR 1.96  Warfarin 5mg  given 7/3  INR 2.56  Warfarin 7.5mg  given 7/4  INR 3.59; held 7/5: INR: 3.14   Goal of Therapy:  INR 2-3 Monitor platelets by anticoagulation protocol: Yes   Plan:  INR slightly above goal range; however likely to drop again tomorrow due to held dose on 7/4. Will give 5 mg PO x 1 today which is (67% of usual home dose today). Will recheck PT/INR with am labs. Pharmacy to monitor and dose per consult.     Larene Beach, [facility] Clinical Pharmacist  07/12/2015,12:15 PM

## 2015-07-12 NOTE — Care Management Important Message (Signed)
Important Message  Patient Details  Name: Teresa Cook MRN: [id number] Date of Birth: 12/15/1955   Medicare Important Message Given:  Yes    [name] 07/12/2015, 11:20 AM

## 2015-07-12 NOTE — Progress Notes (Signed)
Tx complete  

## 2015-07-12 NOTE — Progress Notes (Signed)
Post dialysis 

## 2015-07-12 NOTE — Progress Notes (Signed)
Patient sbp after HD in the low 100's , DR Anselm Jungling was informed and stated to hold am HTN meds at this time .

## 2015-07-12 NOTE — Progress Notes (Signed)
Subjective:  Patient seen and evaluated during dialysis. She has improved significantly as compared to admission.   Objective:  Vital signs in last 24 hours:  Temp:  [97.7 F (36.5 C)-99 F (37.2 C)] 99 F (37.2 C) (07/05 1015) Pulse Rate:  [75-137] 79 (07/05 1035) Resp:  [14-25] 18 (07/05 1035) BP: (95-136)/(60-84) 127/78 mmHg (07/05 1035) SpO2:  [87 %-100 %] 100 % (07/05 1035) Weight:  [67.541 kg (148 lb 14.4 oz)-70.6 kg (155 lb 10.3 oz)] 70.6 kg (155 lb 10.3 oz) (07/05 1015)  Weight change: -8.76 kg (-19 lb 5 oz) Filed Weights   07/11/15 1600 07/12/15 0408 07/12/15 1015  Weight: 67.541 kg (148 lb 14.4 oz) 69.582 kg (153 lb 6.4 oz) 70.6 kg (155 lb 10.3 oz)    Intake/Output:    Intake/Output Summary (Last 24 hours) at 07/12/15 1057 Last data filed at 07/12/15 0955  Gross per 24 hour  Intake    480 ml  Output      0 ml  Net    480 ml     Physical Exam: General: No acute distress  HEENT anicteric  Neck supple  Pulm/lungs Mild basilar rales, normal effort  CVS/Heart S1S2 no rubs  Abdomen:  Soft, NT, non distended  Extremities: No peripheral edema  Neurologic: Alert, able to answer questions  Skin: No acute rashes  Access: Rt IJ PC       Basic Metabolic Panel:   Recent Labs Lab 07/09/15 1245 07/09/15 1500 07/09/15 1755 07/10/15 0224  NA 140  --  138 138  K 4.9  --  4.6 4.8  CL 105  --  101 99*  CO2 23  --  26 28  GLUCOSE 122*  --  143* 96  BUN 51*  --  38* 30*  CREATININE 9.15* 9.28* 6.71* 5.88*  CALCIUM 8.2*  --  8.0* 7.9*  MG  --   --  1.9 1.7  PHOS  --   --  4.5  --      CBC:  Recent Labs Lab 07/09/15 1245 07/09/15 1700 07/10/15 0224  WBC 13.4* 14.2* 14.3*  NEUTROABS 8.3*  --   --   HGB 11.8* 12.3 10.9*  HCT 36.2 36.9 32.6*  MCV 86.9 85.6 84.4  PLT 226 213 116*      Microbiology:  Recent Results (from the past 720 hour(s))  MRSA PCR Screening     Status: None   Collection Time: 07/09/15  4:21 PM  Result Value Ref Range  Status   MRSA by PCR NEGATIVE NEGATIVE Final    Comment:        The GeneXpert MRSA Assay (FDA approved for NASAL specimens only), is one component of a comprehensive MRSA colonization surveillance program. It is not intended to diagnose MRSA infection nor to guide or monitor treatment for MRSA infections.     Coagulation Studies:  Recent Labs  07/09/15 1245 07/10/15 0224 07/11/15 0601 07/12/15 0359  LABPROT 22.2* 24.7* 35.0* 31.7*  INR 1.96 2.26 3.59 3.14    Urinalysis: No results for input(s): COLORURINE, LABSPEC, PHURINE, GLUCOSEU, HGBUR, BILIRUBINUR, KETONESUR, PROTEINUR, UROBILINOGEN, NITRITE, LEUKOCYTESUR in the last 72 hours.  Invalid input(s): APPERANCEUR    Imaging: Dg Chest Port 1 View  07/11/2015  CLINICAL DATA:  Pulmonary edema EXAM: PORTABLE CHEST 1 VIEW COMPARISON:  07/10/2015 FINDINGS: Right dialysis catheter remains in place, unchanged. Heart is normal size. No confluent airspace opacities or effusions. No acute bony abnormality. IMPRESSION: No active disease. Electronically Signed   By:  Rolm Baptise M.D.   On: 07/11/2015 07:49     Medications:     . antiseptic oral rinse  7 mL Mouth Rinse BID  . docusate sodium  100 mg Oral BID  . insulin aspart  2-6 Units Subcutaneous Q4H  . losartan  50 mg Oral Daily  . metoprolol tartrate  50 mg Oral BID  . pantoprazole  40 mg Oral Daily  . sertraline  100 mg Oral Daily  . sevelamer carbonate  800 mg Oral TID WC  . Warfarin - Pharmacist Dosing Inpatient   Does not apply q1800   sodium chloride, acetaminophen, ALPRAZolam, clonazePAM, cloNIDine, hydrALAZINE, ondansetron (ZOFRAN) IV, oxyCODONE-acetaminophen, zolpidem  Assessment/ Plan:  60 y.o.african [place] female with end-stage renal disease, hypertension, cardiomyopathy, chronic systolic congestive heart failure with EF of [id number], grade 1 diastolic dysfuncion presents for shortness of breath and is diagnosed with pulmonary edema. Similar presentation in sep  2016  1. End-stage renal disease. [facility] nephrology. [employer]. M-W-F 2. Acute pulmonary edema requiring NIPPV 3. Anemia of chronic kidney disease 4. Secondary hyperparathyroidism 5. Hypertension, well controlled.  Plan: Patient seen and evaluated during hemodialysis today. Overall has improved from admission. Patient to complete dialysis per her normal schedule. If she is doing well later today discharge could be considered.  Continue losartan and metoprolol for blood pressure control. Recheck phosphorus today. Otherwise continue Renvela 800 mg by mouth 3 times a day with meals.   LOS: 3 Crystelle Ferrufino 7/5/201710:57 AM

## 2015-07-12 NOTE — Progress Notes (Signed)
Dialysis started 

## 2015-07-13 ENCOUNTER — Ambulatory Visit
Admission: RE | Admit: 2015-07-13 | Discharge: 2015-07-13 | Disposition: A | Discharge status: Home / Self Care | Payer: Medicare Other | Source: Ambulatory Visit | Attending: Family Medicine | Admitting: Family Medicine

## 2015-07-13 DIAGNOSIS — N63 Unspecified lump in breast: Secondary | ICD-10-CM | POA: Diagnosis present

## 2015-07-13 DIAGNOSIS — Z992 Dependence on renal dialysis: Secondary | ICD-10-CM | POA: Diagnosis not present

## 2015-07-13 DIAGNOSIS — Z1239 Encounter for other screening for malignant neoplasm of breast: Secondary | ICD-10-CM

## 2015-07-13 DIAGNOSIS — R921 Mammographic calcification found on diagnostic imaging of breast: Secondary | ICD-10-CM | POA: Insufficient documentation

## 2015-07-21 ENCOUNTER — Other Ambulatory Visit: Payer: Self-pay | Admitting: Family Medicine

## 2015-07-21 DIAGNOSIS — N6320 Unspecified lump in the left breast, unspecified quadrant: Secondary | ICD-10-CM

## 2015-07-25 ENCOUNTER — Other Ambulatory Visit: Payer: Self-pay

## 2015-07-25 ENCOUNTER — Ambulatory Visit
Admission: RE | Admit: 2015-07-25 | Discharge: 2015-07-25 | Disposition: A | Discharge status: Home / Self Care | Payer: Medicare Other | Source: Ambulatory Visit | Attending: Family Medicine | Admitting: Family Medicine

## 2015-07-25 DIAGNOSIS — N6320 Unspecified lump in the left breast, unspecified quadrant: Secondary | ICD-10-CM

## 2015-07-25 DIAGNOSIS — C50812 Malignant neoplasm of overlapping sites of left female breast: Secondary | ICD-10-CM | POA: Diagnosis not present

## 2015-07-25 DIAGNOSIS — N63 Unspecified lump in breast: Secondary | ICD-10-CM | POA: Diagnosis present

## 2015-07-26 ENCOUNTER — Ambulatory Visit: Payer: Self-pay | Admitting: Family

## 2015-07-27 ENCOUNTER — Encounter: Payer: Self-pay | Admitting: Family

## 2015-07-27 ENCOUNTER — Ambulatory Visit: Payer: Self-pay | Admitting: Family

## 2015-07-27 ENCOUNTER — Telehealth: Payer: Self-pay | Admitting: Family

## 2015-07-27 LAB — SURGICAL PATHOLOGY

## 2015-07-27 NOTE — Telephone Encounter (Signed)
Patient did not show for her [facility] Failure [facility] appointment on 07/27/15. Will attempt to reschedule.

## 2015-07-28 NOTE — Progress Notes (Signed)
  Oncology Nurse Navigator Documentation  Navigator Location: [facility]Med Onc (07/28/15 1500) Navigator Encounter Type: Introductory phone call (07/28/15 1500)   Abnormal Finding Date: 07/13/15 (07/28/15 1500) Confirmed Diagnosis Date: 07/25/15 (07/28/15 1500)     Patient Visit Type: Initial (07/28/15 1500) Treatment Phase: Pre-Tx/Tx Discussion (07/28/15 1500) Barriers/Navigation Needs: Education;Coordination of Care (07/28/15 1500) Education: Accessing Care/ Finding Providers;Newly Diagnosed Cancer Education;Coping with Diagnosis/ Prognosis;Understanding Cancer/ Treatment Options (07/28/15 1500) Interventions: Referrals (07/28/15 1500)            Acuity: Level 3 (07/28/15 1500)     Acuity Level 3: Coordination of multimodality treatment;Ongoing guidance and education provided throughout treatment;Emotional needs (07/28/15 1500)   Time Spent with Patient: 60 (07/28/15 1500)  Patient had positive biopsy.  Working with Karie Mainland to contact patient, and schedule surgical, and Med-Onc visit.  Scheduled to see Dr. Grayland Ormond 08/01/15 at 11:30, and Dr. Azalee Course 08/09/15 at 2:00.  Information for case conference.  Patient's sister being treated here for breast cancer, and lung cancer.  Informed patient would be at first oncology appointment for support.  States she has a close friend with her at this time.

## 2015-08-01 ENCOUNTER — Ambulatory Visit: Payer: Self-pay

## 2015-08-01 ENCOUNTER — Inpatient Hospital Stay: Payer: Medicare Other | Attending: Oncology | Admitting: Oncology

## 2015-08-01 ENCOUNTER — Encounter: Payer: Self-pay | Admitting: Oncology

## 2015-08-01 ENCOUNTER — Encounter (INDEPENDENT_AMBULATORY_CARE_PROVIDER_SITE_OTHER): Payer: Self-pay

## 2015-08-01 DIAGNOSIS — Z7901 Long term (current) use of anticoagulants: Secondary | ICD-10-CM | POA: Diagnosis not present

## 2015-08-01 DIAGNOSIS — Z170 Estrogen receptor positive status [ER+]: Secondary | ICD-10-CM | POA: Diagnosis not present

## 2015-08-01 DIAGNOSIS — N189 Chronic kidney disease, unspecified: Secondary | ICD-10-CM | POA: Diagnosis not present

## 2015-08-01 DIAGNOSIS — I509 Heart failure, unspecified: Secondary | ICD-10-CM | POA: Insufficient documentation

## 2015-08-01 DIAGNOSIS — E119 Type 2 diabetes mellitus without complications: Secondary | ICD-10-CM | POA: Diagnosis not present

## 2015-08-01 DIAGNOSIS — Z992 Dependence on renal dialysis: Secondary | ICD-10-CM | POA: Insufficient documentation

## 2015-08-01 DIAGNOSIS — Z86718 Personal history of other venous thrombosis and embolism: Secondary | ICD-10-CM | POA: Insufficient documentation

## 2015-08-01 DIAGNOSIS — Z79899 Other long term (current) drug therapy: Secondary | ICD-10-CM | POA: Diagnosis not present

## 2015-08-01 DIAGNOSIS — C50212 Malignant neoplasm of upper-inner quadrant of left female breast: Secondary | ICD-10-CM | POA: Insufficient documentation

## 2015-08-01 DIAGNOSIS — I10 Essential (primary) hypertension: Secondary | ICD-10-CM | POA: Diagnosis not present

## 2015-08-01 NOTE — Progress Notes (Signed)
  Oncology Nurse Navigator Documentation  Navigator Location: [facility]c (08/01/15 1300) Navigator Encounter Type: Initial MedOnc (08/01/15 1300)           Patient Visit Type: Initial (08/01/15 1300) Treatment Phase: Pre-Tx/Tx Discussion (08/01/15 1300) Barriers/Navigation Needs: Education;Coordination of Care (08/01/15 1300) Education: Newly Diagnosed Cancer Education;Pain/ Symptom Management;Coping with Diagnosis/ Prognosis;Understanding Cancer/ Treatment Options (08/01/15 1300) Interventions: Coordination of Care;Education Method (08/01/15 1300)     Education Method: Written;Verbal;Teach-back (08/01/15 1300)            Acuity Level 3: Emotional needs;Ongoing guidance and education provided throughout treatment;Coordination of multimodality treatment (08/01/15 1300)   Time Spent with Patient: 90 (08/01/15 1300)  Met with patient , daughter at initial [facility] visit with Dr. Grayland Ormond.  Discussed neo-adjuvant chemotherapy.  Dr. Azalee Course, and Dr,Finnegan in agreement.  Patient concerned about side effects.  She receives dialysis on MWF, and is anxious about how she will feel.  States she is angry.  Offered [facility].  She meets with Dr. Azalee Course 08/09/15.   Treatment plans, and follow-up will be finalized after that appointment.   Given Breast Cancer Treatment Handbook/folder with hospital services.

## 2015-08-05 DIAGNOSIS — C50212 Malignant neoplasm of upper-inner quadrant of left female breast: Secondary | ICD-10-CM | POA: Insufficient documentation

## 2015-08-05 DIAGNOSIS — Z170 Estrogen receptor positive status [ER+]: Secondary | ICD-10-CM

## 2015-08-05 NOTE — Progress Notes (Signed)
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  INTERVAL HISTORY: Patient is a 60 year old female who noted an enlarging left breast mass. Subsequent mammogram and biopsy revealed the above stated breast cancer. She had a suspicious lymph node that was biopsied that was also positive for disease. Currently, she is anxious but otherwise feels well. She continues to receive dialysis on Mondays, Wednesdays, and Fridays. She has no neurologic complaints. She denies any recent fevers. She is a good appetite and denies weight loss. She has no chest pain or shortness of breath. She denies any nausea, vomiting, constipation, or diarrhea. Patient otherwise feels well and offers no further specific complaints.  REVIEW OF SYSTEMS:   Review of Systems  Constitutional: Negative.  Negative for fever, malaise/fatigue and weight loss.  Respiratory: Negative.  Negative for cough and shortness of breath.   Cardiovascular: Negative.  Negative for chest pain.  Gastrointestinal: Negative.  Negative for abdominal pain.  Genitourinary: Negative.   Musculoskeletal: Negative.   Neurological: Negative.  Negative for weakness.  Psychiatric/Behavioral: The patient is nervous/anxious.     As per HPI. Otherwise, a complete review of systems is negatve.  PAST MEDICAL HISTORY: Past Medical History:  Diagnosis Date  . Anemia   . Anxiety   . CHF (congestive heart failure) ([place])   . Chronic kidney disease   . Depression   . Dialysis patient ([facility])   . DVT (deep venous thrombosis) ([facility])    left leg  . DVT (deep venous thrombosis) ([place])   . Gout   . HTN (hypertension)   .  Hypertension   . Parathyroid abnormality ([place])   . Parathyroid disease ([place])   . Psoriasis     PAST SURGICAL HISTORY: Past Surgical History:  Procedure Laterality Date  . ABDOMINAL HYSTERECTOMY    . APPENDECTOMY    . BREAST BIOPSY Left 10/28/2013   benign  . BREAST EXCISIONAL BIOPSY Left 2002   benign  . PARTIAL HYSTERECTOMY      FAMILY HISTORY: Family History  Problem Relation Age of Onset  . Stroke Mother   . CVA Mother   . Hypertension Mother   . Hypertension Father   . Hypertension Sister   . Diabetes Sister   . Stroke Brother   . Hypertension Brother   . Breast cancer Maternal Aunt 69       ADVANCED DIRECTIVES:    HEALTH MAINTENANCE: Social History  Substance Use Topics  . Smoking status: Never Smoker  . Smokeless tobacco: Never Used  . Alcohol use No     Colonoscopy:  PAP:  Bone density:  Lipid panel:  No Known Allergies  Current Outpatient Prescriptions  Medication Sig Dispense Refill  . cinacalcet (SENSIPAR) 30 MG tablet Take 120 mg by mouth.    . clonazePAM (KLONOPIN) 0.5 MG tablet Take 1/2 tablet before dialysis, can take a whole tablet if needed    . losartan (COZAAR) 50 MG tablet Take 100 mg by mouth.    . metoprolol (LOPRESSOR) 100 MG tablet Take 50 mg by mouth 2 (two) times daily.    . pantoprazole (PROTONIX) 20 MG tablet Take 20 mg by mouth.    . SENSIPAR 90 MG tablet     .  sertraline (ZOLOFT) 100 MG tablet Take by mouth.    . sevelamer carbonate (RENVELA) 800 MG tablet Take 2,400 mg by mouth.    . traMADol (ULTRAM) 50 MG tablet Take 50 mg by mouth.    . warfarin (COUMADIN) 5 MG tablet Take 1 tablet Monday and Friday. Take 1 1/2 tablets the other days of the week    . zolpidem (AMBIEN) 10 MG tablet Take 10 mg by mouth at bedtime as needed for sleep.     No current facility-administered medications for this visit.     OBJECTIVE: Vitals:   08/01/15 1221  BP: 135/74  Pulse: 68  Temp: 98.1 F (36.7 C)     Body mass index is 24.14  kg/m.    ECOG FS:0 - Asymptomatic  General: Well-developed, well-nourished, no acute distress. Eyes: Pink conjunctiva, anicteric sclera. HEENT: Normocephalic, moist mucous membranes, clear oropharnyx. Breasts: Patient refused exam today. Lungs: Clear to auscultation bilaterally. Heart: Regular rate and rhythm. No rubs, murmurs, or gallops. Abdomen: Soft, nontender, nondistended. No organomegaly noted, normoactive bowel sounds. Musculoskeletal: No edema, cyanosis, or clubbing. Dialysis catheter in right chest wall. Neuro: Alert, answering all questions appropriately. Cranial nerves grossly intact. Skin: No rashes or petechiae noted. Psych: Normal affect. Lymphatics: No cervical, calvicular, axillary or inguinal LAD.   LAB RESULTS:  Lab Results  Component Value Date   NA 138 07/10/2015   K 4.8 07/10/2015   CL 99 (L) 07/10/2015   CO2 28 07/10/2015   GLUCOSE 96 07/10/2015   BUN 30 (H) 07/10/2015   CREATININE 5.88 (H) 07/10/2015   CALCIUM 7.9 (L) 07/10/2015   PROT 7.6 09/19/2014   ALBUMIN 3.8 09/19/2014   AST 19 09/19/2014   ALT 16 09/19/2014   ALKPHOS 430 (H) 09/19/2014   BILITOT 0.6 09/19/2014   GFRNONAA 7 (L) 07/10/2015   GFRAA 8 (L) 07/10/2015    Lab Results  Component Value Date   WBC 14.3 (H) 07/10/2015   NEUTROABS 8.3 (H) 07/09/2015   HGB 10.9 (L) 07/10/2015   HCT 32.6 (L) 07/10/2015   MCV 84.4 07/10/2015   PLT 116 (L) 07/10/2015     STUDIES: Dg Chest 1 View  Result Date: 07/09/2015 CLINICAL DATA:  Respiratory distress EXAM: CHEST 1 VIEW COMPARISON:  08/30/2014 FINDINGS: 1253 hours. Diffuse interstitial and central alveolar opacity compatible with edema. Cardiopericardial silhouette is at upper limits of normal for size. Right IJ dialysis catheter tip overlies the distal SVC. Telemetry leads overlie the chest. IMPRESSION: Imaging features compatible with pulmonary edema. Electronically Signed   By: Misty Stanley M.D.   On: 07/09/2015 13:06   Dg Chest Port 1  View  Result Date: 07/11/2015 CLINICAL DATA:  Pulmonary edema EXAM: PORTABLE CHEST 1 VIEW COMPARISON:  07/10/2015 FINDINGS: Right dialysis catheter remains in place, unchanged. Heart is normal size. No confluent airspace opacities or effusions. No acute bony abnormality. IMPRESSION: No active disease. Electronically Signed   By: Rolm Baptise M.D.   On: 07/11/2015 07:49   Dg Chest Port 1 View  Result Date: 07/10/2015 CLINICAL DATA:  Patient with history of end-stage renal disease presenting 07/09/2015 with acute pulmonary edema and hypertensive urgency. EXAM: PORTABLE CHEST 1 VIEW COMPARISON:  Single-view of the chest 07/09/2015, 08/30/2014 and 01/16/2014. FINDINGS: The patient is rotated on the examination. Dialysis catheter is again seen. Interstitial pulmonary edema seen on the most recent examination is improved. There is airspace disease in the lung bases, more notable on the right. No pneumothorax or pleural effusion. No focal bony  abnormality. IMPRESSION: Improved pulmonary edema. Right worse than left basilar airspace disease could be due to atelectasis or pneumonia. Electronically Signed   By: Inge Rise M.D.   On: 07/10/2015 07:21   US Breast Ltd Uni Left Inc Axilla  Result Date: 07/13/2015 CLINICAL DATA:  60 year old patient presents with several months history of a palpable lump in the 3 o'clock position of the left breast. More recently, she noticed some overlying skin dimpling. She has been on hemodialysis for approximately 3 years. She underwent a left breast stereotactic biopsy October 2015 for suspicious microcalcifications. Two separate groups of microcalcifications were initially recommended for biopsy, but the patient could only tolerate biopsy of one area, which had benign pathology results. It was suggested that she return in 6 months for follow-up mammogram given the second group of microcalcifications, but she has been unable to be follow-up until this time. The patient has a  remote history of an excisional biopsy in the far superior left breast. These pathology results are not available. EXAM: 2D DIGITAL DIAGNOSTIC BILATERAL MAMMOGRAM WITH CAD AND ADJUNCT TOMO ULTRASOUND LEFT BREAST COMPARISON:  10/28/2013, 10/26/2013, 10/14/2013, 08/04/2012, 05/23/2011, 02/13/2010, 01/10/2009, 12/23/2007 ACR Breast Density Category b: There are scattered areas of fibroglandular density. FINDINGS: There are extensive vascular calcifications throughout both breasts. The patient's right chest hemodialysis catheter projects over the medial right breast. No mass, distortion, or suspicious microcalcification is identified in the right breast. A metallic skin marker was placed over the region of patient palpable concern in the outer left breast. Corresponding to the palpable abnormality is an irregular mass, middle third, 3 o'clock region. There a cylindrical biopsy clip in the posterior third of the upper outer left breast at the site of the stereotactic biopsy performed in 2015. In this region of the biopsy clip, there is a similar appearance of numerous microcalcifications. The biopsy clip associated with the previously biopsied microcalcifications (2015) in the upper-outer left breast are approximately 5 cm posterior and slightly lateral to the suspicious palpable mass. Again noted is a group of microcalcifications in the posterior third of the central and 6 o'clock left breast. This is a heterogeneous group of microcalcifications that spans approximately 4.8 cm greatest diameter, anterior to posterior, on the ML magnification view, and are approximately 5 cm directly inferior to the previously biopsied group of microcalcifications in the upper outer left breast. There are 2 suspicious, dense and prominent left axillary lymph nodes, both of which contain microcalcifications. Mammographic images were processed with CAD. On physical exam, there is a discretely palpable approximately 1.5 cm lump in the left  breast at 3 o'clock position 4 cm from the nipple. There is retraction over the overlying skin. No additional mass is palpated in the outer left breast. In the inferior left axilla are two discrete palpable axillary lymph nodes. Targeted ultrasound is performed, showing a markedly irregular hypoechoic taller than wide mass at 3 o'clock position left breast 4 cm from the nipple. This mass is best visualized with harmonics, measures 1.0 x 0.8 x 2 0.8 cm, and contains some internal and adjacent vascular flow. In the upper outer quadrant of the left breast, 2 o'clock region approximately 6 cm from the nipple, are indistinct and irregular hypoechoic areas with some intervening adjacent normal appearing tissue. There are some associated hyperechoic foci likely reflecting the microcalcifications present in the upper outer left breast. In the radial dimension, this abnormality in the 2 o'clock left breast spans approximately 1.9 cm, and in the antiradial dimension it spans  approximately 1.5 cm. In the left axilla, corresponding to the palpable masses are 2 abnormal lymph nodes with thickened cortices, internal cystic spaces, and hyperechoic foci consistent with calcifications. These lymph nodes measure 1.1 x 0.9 x 0.9 cm and 0.8 x 0.8 x 0.7 cm, respectively. IMPRESSION: Findings highly suspicious for malignancy involving the upper-outer quadrant of the left breast, and the central/6 o'clock region of the left breast with associated left axillary nodal metastases. No evidence of malignancy in the right breast. Extensive atherosclerotic vascular calcifications throughout both breasts. Hemodialysis catheter projects over the right breast. RECOMMENDATION: 1. Three ultrasound-guided biopsies are recommended as described below: 2. Ultrasound-guided biopsy of the palpable suspicious mass with overlying skin retraction in the 3 o'clock position of the left breast 4 cm from the nipple. 3. Ultrasound-guided biopsy of the irregular  hypoechoic areas with associated microcalcifications in the 2 o'clock region of the left breast, centered 6 cm from the nipple. 4. Ultrasound-guided biopsy of 1 of the 2 suspicious left axillary lymph nodes. 5. Additionally, there are suspicious microcalcifications in the far posterior central/6 o'clock region of the left breast that have not been previously biopsied. The patient states that she had a difficult time with the prone stereotactic biopsy previously and does not believe she could undergo a prone position biopsy given the presence of her hemodialysis catheter. Therefore, if biopsy of the suspicious microcalcifications is needed for surgical planning, I would recommend she be scheduled for a stereotactic biopsy at the [facility] with the [facility] unit, in which the patient can sit upright in a chair. I have discussed the findings and recommendations with the patient. Results were also provided in writing at the conclusion of the visit. If applicable, a reminder letter will be sent to the patient regarding the next appointment. BI-RADS CATEGORY  5: Highly suggestive of malignancy. Electronically Signed   By: Curlene Dolphin M.D.   On: 07/13/2015 16:20   Mm Diag Breast Tomo Bilateral  Result Date: 07/13/2015 CLINICAL DATA:  60 year old patient presents with several months history of a palpable lump in the 3 o'clock position of the left breast. More recently, she noticed some overlying skin dimpling. She has been on hemodialysis for approximately 3 years. She underwent a left breast stereotactic biopsy October 2015 for suspicious microcalcifications. Two separate groups of microcalcifications were initially recommended for biopsy, but the patient could only tolerate biopsy of one area, which had benign pathology results. It was suggested that she return in 6 months for follow-up mammogram given the second group of microcalcifications, but she has been unable to be follow-up until this  time. The patient has a remote history of an excisional biopsy in the far superior left breast. These pathology results are not available. EXAM: 2D DIGITAL DIAGNOSTIC BILATERAL MAMMOGRAM WITH CAD AND ADJUNCT TOMO ULTRASOUND LEFT BREAST COMPARISON:  10/28/2013, 10/26/2013, 10/14/2013, 08/04/2012, 05/23/2011, 02/13/2010, 01/10/2009, 12/23/2007 ACR Breast Density Category b: There are scattered areas of fibroglandular density. FINDINGS: There are extensive vascular calcifications throughout both breasts. The patient's right chest hemodialysis catheter projects over the medial right breast. No mass, distortion, or suspicious microcalcification is identified in the right breast. A metallic skin marker was placed over the region of patient palpable concern in the outer left breast. Corresponding to the palpable abnormality is an irregular mass, middle third, 3 o'clock region. There a cylindrical biopsy clip in the posterior third of the upper outer left breast at the site of the stereotactic biopsy performed in 2015. In this  region of the biopsy clip, there is a similar appearance of numerous microcalcifications. The biopsy clip associated with the previously biopsied microcalcifications (2015) in the upper-outer left breast are approximately 5 cm posterior and slightly lateral to the suspicious palpable mass. Again noted is a group of microcalcifications in the posterior third of the central and 6 o'clock left breast. This is a heterogeneous group of microcalcifications that spans approximately 4.8 cm greatest diameter, anterior to posterior, on the ML magnification view, and are approximately 5 cm directly inferior to the previously biopsied group of microcalcifications in the upper outer left breast. There are 2 suspicious, dense and prominent left axillary lymph nodes, both of which contain microcalcifications. Mammographic images were processed with CAD. On physical exam, there is a discretely palpable approximately  1.5 cm lump in the left breast at 3 o'clock position 4 cm from the nipple. There is retraction over the overlying skin. No additional mass is palpated in the outer left breast. In the inferior left axilla are two discrete palpable axillary lymph nodes. Targeted ultrasound is performed, showing a markedly irregular hypoechoic taller than wide mass at 3 o'clock position left breast 4 cm from the nipple. This mass is best visualized with harmonics, measures 1.0 x 0.8 x 2 0.8 cm, and contains some internal and adjacent vascular flow. In the upper outer quadrant of the left breast, 2 o'clock region approximately 6 cm from the nipple, are indistinct and irregular hypoechoic areas with some intervening adjacent normal appearing tissue. There are some associated hyperechoic foci likely reflecting the microcalcifications present in the upper outer left breast. In the radial dimension, this abnormality in the 2 o'clock left breast spans approximately 1.9 cm, and in the antiradial dimension it spans approximately 1.5 cm. In the left axilla, corresponding to the palpable masses are 2 abnormal lymph nodes with thickened cortices, internal cystic spaces, and hyperechoic foci consistent with calcifications. These lymph nodes measure 1.1 x 0.9 x 0.9 cm and 0.8 x 0.8 x 0.7 cm, respectively. IMPRESSION: Findings highly suspicious for malignancy involving the upper-outer quadrant of the left breast, and the central/6 o'clock region of the left breast with associated left axillary nodal metastases. No evidence of malignancy in the right breast. Extensive atherosclerotic vascular calcifications throughout both breasts. Hemodialysis catheter projects over the right breast. RECOMMENDATION: 1. Three ultrasound-guided biopsies are recommended as described below: 2. Ultrasound-guided biopsy of the palpable suspicious mass with overlying skin retraction in the 3 o'clock position of the left breast 4 cm from the nipple. 3. Ultrasound-guided  biopsy of the irregular hypoechoic areas with associated microcalcifications in the 2 o'clock region of the left breast, centered 6 cm from the nipple. 4. Ultrasound-guided biopsy of 1 of the 2 suspicious left axillary lymph nodes. 5. Additionally, there are suspicious microcalcifications in the far posterior central/6 o'clock region of the left breast that have not been previously biopsied. The patient states that she had a difficult time with the prone stereotactic biopsy previously and does not believe she could undergo a prone position biopsy given the presence of her hemodialysis catheter. Therefore, if biopsy of the suspicious microcalcifications is needed for surgical planning, I would recommend she be scheduled for a stereotactic biopsy at the [facility] with the [facility] unit, in which the patient can sit upright in a chair. I have discussed the findings and recommendations with the patient. Results were also provided in writing at the conclusion of the visit. If applicable, a reminder letter will be sent  to the patient regarding the next appointment. BI-RADS CATEGORY  5: Highly suggestive of malignancy. Electronically Signed   By: Curlene Dolphin M.D.   On: 07/13/2015 16:20   Mm Clip Placement Left  Result Date: 07/25/2015 CLINICAL DATA:  Status post ultrasound-guided biopsies of 2 left breast masses and an enlarged left axillary lymph node earlier today. EXAM: DIAGNOSTIC LEFT MAMMOGRAM POST ULTRASOUND BIOPSY x3 COMPARISON:  Previous exam(s). FINDINGS: Mammographic images were obtained following ultrasound guided biopsies of 2 left breast masses, located at the 3 o'clock and 2 o'clock axes respectively, and of an enlarged/morphologically abnormal lymph node in the left axilla. At the conclusion of the biopsy at the 3 o'clock axis, a coil shaped clip was placed at the biopsy site. This clip appears appropriately positioned on the postprocedure mammogram. At the conclusion of the  biopsy at the 2 o'clock axis, a ribbon shaped clip was placed at the biopsy site. This clip also appears appropriately positioned on the postprocedure mammogram. At the conclusion of the biopsy of the enlarged/morphologically abnormal lymph node in the left axilla, a wing shaped clip was placed into the lymph node. It was not certain that the clip properly deployed so a second wing shaped clip was placed in the lymph node. Both of these clips are appropriately positioned within the lymph node. IMPRESSION: Postprocedure mammogram for localization of clips. Clips appropriately positioned at each biopsy site, as detailed above. Per earlier diagnostic mammogram report, additional suspicious calcifications were identified in the left breast for which a stereotactic biopsy was recommended. If pathology results for today's study are positive for malignancy and if breast conservation therapy is considered, additional stereotactic biopsy ([facility]) will be scheduled. If pathology results for today's study are negative for malignancy, the additional stereotactic biopsy will be scheduled. Final Assessment: Post Procedure Mammograms for Marker Placement Electronically Signed   By: Franki Cabot M.D.   On: 07/25/2015 10:22   [place] Lt Breast Bx W Loc Dev 1st Lesion Img Bx Spec US Guide  Addendum Date: 07/28/2015   ADDENDUM REPORT: 07/28/2015 17:34 ADDENDUM: Per earlier diagnostic mammogram/ultrasound report, additional suspicious calcifications were identified in the left breast (far posterior central/6 o'clock region) which were recommended for stereotactic-guided biopsy if breast conservation therapy is considered. If this is to be performed, it should be performed on the upright Affirm unit as patient states that she had a difficult time with a previous prone stereotactic biopsy. Electronically Signed   By: Franki Cabot M.D.   On: 07/28/2015 17:34  Addendum Date: 07/28/2015   ADDENDUM REPORT: 07/28/2015 16:55 ADDENDUM:  Pathology of biopsies of two left breast masses and an enlarged left axillary lymph node revealed A. LEFT BREAST, 3:00, 4 CMFN; BIOPSY: INVASIVE MAMMARY CARCINOMA OF NO SPECIAL TYPE. PRELIMINARY GRADE: 1 ([place] HISTOLOGIC GRADE). B. LEFT BREAST, 2:00, 6 CMFN; BIOPSY: INVASIVE MAMMARY CARCINOMA OF NO SPECIAL TYPE. PRELIMINARY GRADE: 1 ([place] HISTOLOGIC GRADE). C. LEFT BREAST, AXILLA; BIOPSY:  METASTATIC MAMMARY CARCINOMA. Note: ER, PR and HER2-neu immunohistochemistry is obtained (A1) and results will be issued in an addendum. HER2 FISH will be performed for equivocal results. Final histologic grade should be based on the excised tumor. Results were called to [facility] in the Radiology Department on 07/26/15 at 2:55 PM. All three sites were found to be concordant by Dr. Enriqueta Shutter. Recommendations:  Surgical and oncology referrals. At the patient's request, pathology and recommendations were relayed to the patient by Dr. Theda Sers. The patient has done well following the biopsy. Post biopsy instructions were  reviewed with the patient. All of her questions were answered. She was encouraged to call the [facility] with any further questions or concerns. Dr. Theda Sers told the patient she will be contacted by one of the nurse navigators at [facility] with appointment information. Al Pimple, RN, nurse navigator for [facility] was notified of results and recommendations by Jetta Lout, [facility] Radiology). An appointment was made with Dr. Grayland Ormond in oncology for Tuesday, 08/01/15 at 11:30 AM. An appointment with Dr. Azalee Course, surgeon was made for 08/11/15 at 2:00 PM. The patient has been notified of appointments. Addendum by Jetta Lout, RRA on 07/28/15. Electronically Signed   By: Franki Cabot M.D.   On: 07/28/2015 16:55  Result Date: 07/28/2015 CLINICAL DATA:  Patient with 2 suspicious left breast masses and an enlarged  left axillary lymph node identified on recent diagnostic mammogram/ultrasound for which ultrasound-guided biopsies were recommended. Patient presents today for the ultrasound-guided biopsies. EXAM: ULTRASOUND GUIDED LEFT BREAST CORE NEEDLE BIOPSY x3 (2 left breast masses and an enlarged lymph node in the left axilla). COMPARISON:  Previous exam(s). PROCEDURE: I met with the patient and we discussed the procedure of ultrasound-guided biopsy, including benefits and alternatives. We discussed the high likelihood of a successful procedure. We discussed the risks of the procedure including infection, bleeding, tissue injury, clip migration, and inadequate sampling. Informed written consent was given. The usual time-out protocol was performed immediately prior to the procedure. Using sterile technique and 1% Lidocaine as local anesthetic, under direct ultrasound visualization, a 12 gauge spring-loaded device was used to perform biopsy of the irregular mass in the left breast at the 3 o'clock axisusing a lateral approach. At the conclusion of the procedure, a coil shaped tissue marker clip was deployed into the biopsy cavity. Next, using sterile technique and 1% lidocaine as local anesthetic, under direct ultrasound visualization, a 12 gauge spring-loaded device was used to perform biopsy of the irregular mass in the left breast at the 2 o'clock axis using a lateral approach. At the conclusion of the procedure, a ribbon shaped tissue marker clip was deployed into the biopsy cavity. Next, using sterile technique and 1% lidocaine as local anesthetic, under direct ultrasound visualization, a 12 gauge spring-loaded device was used to perform biopsy of the enlarged/morphologically abnormal lymph node in the left axilla using a lateral approach. At the conclusion of the procedure, a wing shaped tissue marker clip was deployed into the lymph node. It was not certain that the tissue marker clip properly deployed so a second wing  shaped tissue marker clip was deployed into the lymph node. Follow-up 2-view mammogram was performed and dictated separately. IMPRESSION: Ultrasound-guided biopsies of 2 left breast masses, 3 o'clock and 2 o'clock axes respectively, and an enlarged/morphologically abnormal lymph node in the left axilla. No apparent complications. Nodule A:  3 o'clock axis, coil shaped clip. Nodule B:  2 o'clock axis, ribbon shaped clip. Nodule C:  Left axilla lymph node, wing shaped clips. Electronically Signed: By: Franki Cabot M.D. On: 07/25/2015 09:59   [place] Lt Breast Bx W Loc Dev Ea Add Lesion Img Bx Spec US Guide  Addendum Date: 07/28/2015   ADDENDUM REPORT: 07/28/2015 17:34 ADDENDUM: Per earlier diagnostic mammogram/ultrasound report, additional suspicious calcifications were identified in the left breast (far posterior central/6 o'clock region) which were recommended for stereotactic-guided biopsy if breast conservation therapy is considered. If this is to be performed, it should be performed on the  upright Affirm unit as patient states that she had a difficult time with a previous prone stereotactic biopsy. Electronically Signed   By: Franki Cabot M.D.   On: 07/28/2015 17:34  Addendum Date: 07/28/2015   ADDENDUM REPORT: 07/28/2015 16:55 ADDENDUM: Pathology of biopsies of two left breast masses and an enlarged left axillary lymph node revealed A. LEFT BREAST, 3:00, 4 CMFN; BIOPSY: INVASIVE MAMMARY CARCINOMA OF NO SPECIAL TYPE. PRELIMINARY GRADE: 1 (NOTTINGHAM HISTOLOGIC GRADE). B. LEFT BREAST, 2:00, 6 CMFN; BIOPSY: INVASIVE MAMMARY CARCINOMA OF NO SPECIAL TYPE. PRELIMINARY GRADE: 1 (NOTTINGHAM HISTOLOGIC GRADE). C. LEFT BREAST, AXILLA; BIOPSY:  METASTATIC MAMMARY CARCINOMA. Note: ER, PR and HER2-neu immunohistochemistry is obtained (A1) and results will be issued in an addendum. HER2 FISH will be performed for equivocal results. Final histologic grade should be based on the excised tumor. Results were called to [facility]  in the Radiology Department on 07/26/15 at 2:55 PM. All three sites were found to be concordant by Dr. Enriqueta Shutter. Recommendations:  Surgical and oncology referrals. At the patient's request, pathology and recommendations were relayed to the patient by Dr. Theda Sers. The patient has done well following the biopsy. Post biopsy instructions were reviewed with the patient. All of her questions were answered. She was encouraged to call the [place] [facility] with any further questions or concerns. Dr. Theda Sers told the patient she will be contacted by one of the nurse navigators at [facility] with appointment information. Al Pimple, RN, nurse navigator for [facility] was notified of results and recommendations by Jetta Lout, [facility]). An appointment was made with Dr. Grayland Ormond in oncology for Tuesday, 08/01/15 at 11:30 AM. An appointment with Dr. Azalee Course, surgeon was made for 08/11/15 at 2:00 PM. The patient has been notified of appointments. Addendum by Jetta Lout, RRA on 07/28/15. Electronically Signed   By: Franki Cabot M.D.   On: 07/28/2015 16:55  Result Date: 07/28/2015 CLINICAL DATA:  Patient with 2 suspicious left breast masses and an enlarged left axillary lymph node identified on recent diagnostic mammogram/ultrasound for which ultrasound-guided biopsies were recommended. Patient presents today for the ultrasound-guided biopsies. EXAM: ULTRASOUND GUIDED LEFT BREAST CORE NEEDLE BIOPSY x3 (2 left breast masses and an enlarged lymph node in the left axilla). COMPARISON:  Previous exam(s). PROCEDURE: I met with the patient and we discussed the procedure of ultrasound-guided biopsy, including benefits and alternatives. We discussed the high likelihood of a successful procedure. We discussed the risks of the procedure including infection, bleeding, tissue injury, clip migration, and inadequate sampling. Informed written  consent was given. The usual time-out protocol was performed immediately prior to the procedure. Using sterile technique and 1% Lidocaine as local anesthetic, under direct ultrasound visualization, a 12 gauge spring-loaded device was used to perform biopsy of the irregular mass in the left breast at the 3 o'clock axisusing a lateral approach. At the conclusion of the procedure, a coil shaped tissue marker clip was deployed into the biopsy cavity. Next, using sterile technique and 1% lidocaine as local anesthetic, under direct ultrasound visualization, a 12 gauge spring-loaded device was used to perform biopsy of the irregular mass in the left breast at the 2 o'clock axis using a lateral approach. At the conclusion of the procedure, a ribbon shaped tissue marker clip was deployed into the biopsy cavity. Next, using sterile technique and 1% lidocaine as local anesthetic, under direct ultrasound visualization, a 12 gauge spring-loaded device was used to perform biopsy of  the enlarged/morphologically abnormal lymph node in the left axilla using a lateral approach. At the conclusion of the procedure, a wing shaped tissue marker clip was deployed into the lymph node. It was not certain that the tissue marker clip properly deployed so a second wing shaped tissue marker clip was deployed into the lymph node. Follow-up 2-view mammogram was performed and dictated separately. IMPRESSION: Ultrasound-guided biopsies of 2 left breast masses, 3 o'clock and 2 o'clock axes respectively, and an enlarged/morphologically abnormal lymph node in the left axilla. No apparent complications. Nodule A:  3 o'clock axis, coil shaped clip. Nodule B:  2 o'clock axis, ribbon shaped clip. Nodule C:  Left axilla lymph node, wing shaped clips. Electronically Signed: By: Franki Cabot M.D. On: 07/25/2015 09:59   [place] Lt Breast Bx W Loc Dev Ea Add Lesion Img Bx Spec US Guide  Addendum Date: 07/28/2015   ADDENDUM REPORT: 07/28/2015 17:34 ADDENDUM:  Per earlier diagnostic mammogram/ultrasound report, additional suspicious calcifications were identified in the left breast (far posterior central/6 o'clock region) which were recommended for stereotactic-guided biopsy if breast conservation therapy is considered. If this is to be performed, it should be performed on the upright Affirm unit as patient states that she had a difficult time with a previous prone stereotactic biopsy. Electronically Signed   By: Franki Cabot M.D.   On: 07/28/2015 17:34  Addendum Date: 07/28/2015   ADDENDUM REPORT: 07/28/2015 16:55 ADDENDUM: Pathology of biopsies of two left breast masses and an enlarged left axillary lymph node revealed A. LEFT BREAST, 3:00, 4 CMFN; BIOPSY: INVASIVE MAMMARY CARCINOMA OF NO SPECIAL TYPE. PRELIMINARY GRADE: 1 (NOTTINGHAM HISTOLOGIC GRADE). B. LEFT BREAST, 2:00, 6 CMFN; BIOPSY: INVASIVE MAMMARY CARCINOMA OF NO SPECIAL TYPE. PRELIMINARY GRADE: 1 (NOTTINGHAM HISTOLOGIC GRADE). C. LEFT BREAST, AXILLA; BIOPSY:  METASTATIC MAMMARY CARCINOMA. Note: ER, PR and HER2-neu immunohistochemistry is obtained (A1) and results will be issued in an addendum. HER2 FISH will be performed for equivocal results. Final histologic grade should be based on the excised tumor. Results were called to [facility] in the Radiology Department on 07/26/15 at 2:55 PM. All three sites were found to be concordant by Dr. Enriqueta Shutter. Recommendations:  Surgical and oncology referrals. At the patient's request, pathology and recommendations were relayed to the patient by Dr. Theda Sers. The patient has done well following the biopsy. Post biopsy instructions were reviewed with the patient. All of her questions were answered. She was encouraged to call the [place] [facility] with any further questions or concerns. Dr. Theda Sers told the patient she will be contacted by one of the nurse navigators at [facility] with appointment information. Al Pimple, RN, nurse navigator for [facility] was notified of results and recommendations by Jetta Lout, [facility]). An appointment was made with Dr. Grayland Ormond in oncology for Tuesday, 08/01/15 at 11:30 AM. An appointment with Dr. Azalee Course, surgeon was made for 08/11/15 at 2:00 PM. The patient has been notified of appointments. Addendum by Jetta Lout, RRA on 07/28/15. Electronically Signed   By: Franki Cabot M.D.   On: 07/28/2015 16:55  Result Date: 07/28/2015 CLINICAL DATA:  Patient with 2 suspicious left breast masses and an enlarged left axillary lymph node identified on recent diagnostic mammogram/ultrasound for which ultrasound-guided biopsies were recommended. Patient presents today for the ultrasound-guided biopsies. EXAM: ULTRASOUND GUIDED LEFT BREAST CORE NEEDLE BIOPSY x3 (2 left breast masses and an enlarged lymph node in the left axilla). COMPARISON:  Previous exam(s).  PROCEDURE: I met with the patient and we discussed the procedure of ultrasound-guided biopsy, including benefits and alternatives. We discussed the high likelihood of a successful procedure. We discussed the risks of the procedure including infection, bleeding, tissue injury, clip migration, and inadequate sampling. Informed written consent was given. The usual time-out protocol was performed immediately prior to the procedure. Using sterile technique and 1% Lidocaine as local anesthetic, under direct ultrasound visualization, a 12 gauge spring-loaded device was used to perform biopsy of the irregular mass in the left breast at the 3 o'clock axisusing a lateral approach. At the conclusion of the procedure, a coil shaped tissue marker clip was deployed into the biopsy cavity. Next, using sterile technique and 1% lidocaine as local anesthetic, under direct ultrasound visualization, a 12 gauge spring-loaded device was used to perform biopsy of the irregular mass in the left breast at the 2 o'clock axis using  a lateral approach. At the conclusion of the procedure, a ribbon shaped tissue marker clip was deployed into the biopsy cavity. Next, using sterile technique and 1% lidocaine as local anesthetic, under direct ultrasound visualization, a 12 gauge spring-loaded device was used to perform biopsy of the enlarged/morphologically abnormal lymph node in the left axilla using a lateral approach. At the conclusion of the procedure, a wing shaped tissue marker clip was deployed into the lymph node. It was not certain that the tissue marker clip properly deployed so a second wing shaped tissue marker clip was deployed into the lymph node. Follow-up 2-view mammogram was performed and dictated separately. IMPRESSION: Ultrasound-guided biopsies of 2 left breast masses, 3 o'clock and 2 o'clock axes respectively, and an enlarged/morphologically abnormal lymph node in the left axilla. No apparent complications. Nodule A:  3 o'clock axis, coil shaped clip. Nodule B:  2 o'clock axis, ribbon shaped clip. Nodule C:  Left axilla lymph node, wing shaped clips. Electronically Signed: By: Franki Cabot M.D. 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  PLAN:    1. 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 Given the patient is on dialysis, she is hesitant to proceed with any chemotherapy at this time. Because of her dialysis catheter, port placement would be difficult. Have encouraged patient to think strongly about taking chemotherapy. Have even offered a less toxic regimen such as Taxotere and Cytoxan rather than the recommended Adriamycin and Cytoxan, and Taxol. Patient will also require surgery, most likely a mastectomy given the size of her initial tumor. Finally, at the conclusion  all of patient's treatments she will benefit from an aromatase inhibitor for total 5 years. Patient has an appointment with surgery in 1 week and would like to discuss treatment options with them prior to making a decision. No follow-up has been scheduled at this time, but we have encouraged patient to make a decision sooner than later and highly consider neoadjuvant treatment.  Approximately 45 minutes was spent in discussion of which greater than 50% was consultation.   Patient expressed understanding and was in agreement with this plan. She also understands that She can call clinic at any time with any questions, concerns, or complaints.   Breast cancer of upper-inner quadrant of left female breast [facility])   Staging form: Breast, AJCC 7th Edition   - Clinical stage from 08/05/2015: Stage IIB (T2,  N1, M0) - Signed by [name], MD on 08/05/2015  [name], MD   08/05/2015 7:14 AM

## 2015-08-07 ENCOUNTER — Encounter: Payer: Self-pay | Admitting: Emergency Medicine

## 2015-08-07 ENCOUNTER — Telehealth: Payer: Self-pay

## 2015-08-07 ENCOUNTER — Emergency Department: Payer: Medicare Other

## 2015-08-07 ENCOUNTER — Inpatient Hospital Stay
Admission: EM | Admit: 2015-08-07 | Discharge: 2015-08-08 | DRG: 291 | Disposition: A | Discharge status: Home / Self Care | Payer: Medicare Other | Attending: Pulmonary Disease | Admitting: Pulmonary Disease

## 2015-08-07 DIAGNOSIS — Z9049 Acquired absence of other specified parts of digestive tract: Secondary | ICD-10-CM | POA: Diagnosis not present

## 2015-08-07 DIAGNOSIS — D631 Anemia in chronic kidney disease: Secondary | ICD-10-CM | POA: Diagnosis present

## 2015-08-07 DIAGNOSIS — J9601 Acute respiratory failure with hypoxia: Secondary | ICD-10-CM | POA: Diagnosis present

## 2015-08-07 DIAGNOSIS — Z86718 Personal history of other venous thrombosis and embolism: Secondary | ICD-10-CM | POA: Diagnosis not present

## 2015-08-07 DIAGNOSIS — Z803 Family history of malignant neoplasm of breast: Secondary | ICD-10-CM

## 2015-08-07 DIAGNOSIS — R0603 Acute respiratory distress: Secondary | ICD-10-CM

## 2015-08-07 DIAGNOSIS — M109 Gout, unspecified: Secondary | ICD-10-CM | POA: Diagnosis present

## 2015-08-07 DIAGNOSIS — Z833 Family history of diabetes mellitus: Secondary | ICD-10-CM

## 2015-08-07 DIAGNOSIS — J9690 Respiratory failure, unspecified, unspecified whether with hypoxia or hypercapnia: Secondary | ICD-10-CM

## 2015-08-07 DIAGNOSIS — Z7901 Long term (current) use of anticoagulants: Secondary | ICD-10-CM

## 2015-08-07 DIAGNOSIS — Z823 Family history of stroke: Secondary | ICD-10-CM | POA: Diagnosis not present

## 2015-08-07 DIAGNOSIS — I248 Other forms of acute ischemic heart disease: Secondary | ICD-10-CM | POA: Diagnosis present

## 2015-08-07 DIAGNOSIS — Z853 Personal history of malignant neoplasm of breast: Secondary | ICD-10-CM | POA: Diagnosis not present

## 2015-08-07 DIAGNOSIS — Z8249 Family history of ischemic heart disease and other diseases of the circulatory system: Secondary | ICD-10-CM

## 2015-08-07 DIAGNOSIS — Z90710 Acquired absence of both cervix and uterus: Secondary | ICD-10-CM

## 2015-08-07 DIAGNOSIS — I160 Hypertensive urgency: Secondary | ICD-10-CM | POA: Diagnosis not present

## 2015-08-07 DIAGNOSIS — D72829 Elevated white blood cell count, unspecified: Secondary | ICD-10-CM | POA: Diagnosis present

## 2015-08-07 DIAGNOSIS — I429 Cardiomyopathy, unspecified: Secondary | ICD-10-CM | POA: Diagnosis present

## 2015-08-07 DIAGNOSIS — I132 Hypertensive heart and chronic kidney disease with heart failure and with stage 5 chronic kidney disease, or end stage renal disease: Principal | ICD-10-CM | POA: Diagnosis present

## 2015-08-07 DIAGNOSIS — N2581 Secondary hyperparathyroidism of renal origin: Secondary | ICD-10-CM | POA: Diagnosis present

## 2015-08-07 DIAGNOSIS — Z79899 Other long term (current) drug therapy: Secondary | ICD-10-CM | POA: Diagnosis not present

## 2015-08-07 DIAGNOSIS — I5042 Chronic combined systolic (congestive) and diastolic (congestive) heart failure: Secondary | ICD-10-CM | POA: Diagnosis present

## 2015-08-07 DIAGNOSIS — Z992 Dependence on renal dialysis: Secondary | ICD-10-CM

## 2015-08-07 DIAGNOSIS — J810 Acute pulmonary edema: Secondary | ICD-10-CM

## 2015-08-07 DIAGNOSIS — C50212 Malignant neoplasm of upper-inner quadrant of left female breast: Secondary | ICD-10-CM | POA: Diagnosis not present

## 2015-08-07 DIAGNOSIS — N186 End stage renal disease: Secondary | ICD-10-CM

## 2015-08-07 DIAGNOSIS — J811 Chronic pulmonary edema: Secondary | ICD-10-CM | POA: Diagnosis present

## 2015-08-07 DIAGNOSIS — J9621 Acute and chronic respiratory failure with hypoxia: Secondary | ICD-10-CM | POA: Diagnosis not present

## 2015-08-07 LAB — COMPREHENSIVE METABOLIC PANEL
ALK PHOS: 1043 U/L — AB (ref 38–126)
ALT: 14 U/L (ref 14–54)
AST: 19 U/L (ref 15–41)
Albumin: 3.6 g/dL (ref 3.5–5.0)
Anion gap: 14 (ref 5–15)
BILIRUBIN TOTAL: 0.6 mg/dL (ref 0.3–1.2)
BUN: 53 mg/dL — ABNORMAL HIGH (ref 6–20)
CALCIUM: 7.7 mg/dL — AB (ref 8.9–10.3)
CO2: 20 mmol/L — ABNORMAL LOW (ref 22–32)
CREATININE: 9.01 mg/dL — AB (ref 0.44–1.00)
Chloride: 107 mmol/L (ref 101–111)
GFR, EST AFRICAN AMERICAN: 5 mL/min — AB (ref >60)
GFR, EST NON AFRICAN AMERICAN: 4 mL/min — AB (ref >60)
Glucose, Bld: 173 mg/dL — ABNORMAL HIGH (ref 65–99)
Potassium: 4.7 mmol/L (ref 3.5–5.1)
Sodium: 141 mmol/L (ref 135–145)
Total Protein: 7.2 g/dL (ref 6.5–8.1)

## 2015-08-07 LAB — GLUCOSE, CAPILLARY: Glucose-Capillary: 99 mg/dL (ref 65–99)

## 2015-08-07 LAB — CBC
HCT: 32.9 % — ABNORMAL LOW (ref 35.0–47.0)
HEMOGLOBIN: 10.7 g/dL — AB (ref 12.0–16.0)
MCH: 28.8 pg (ref 26.0–34.0)
MCHC: 32.5 g/dL (ref 32.0–36.0)
MCV: 88.6 fL (ref 80.0–100.0)
Platelets: 260 10*3/uL (ref 150–440)
RBC: 3.71 MIL/uL — AB (ref 3.80–5.20)
RDW: 16.2 % — ABNORMAL HIGH (ref 11.5–14.5)
WBC: 18 10*3/uL — ABNORMAL HIGH (ref 3.6–11.0)

## 2015-08-07 LAB — BLOOD GAS, VENOUS
ACID-BASE DEFICIT: 7.5 mmol/L — AB (ref 0.0–2.0)
BICARBONATE: 20.4 meq/L — AB (ref 21.0–28.0)
Delivery systems: POSITIVE
FIO2: 100
Mechanical Rate: 12
Patient temperature: 37
pCO2, Ven: 51 mmHg (ref 44.0–60.0)
pH, Ven: 7.21 — ABNORMAL LOW (ref 7.320–7.430)
pO2, Ven: <31 mmHg — ABNORMAL LOW (ref 31.0–45.0)

## 2015-08-07 LAB — PROTIME-INR
INR: 1.79
Prothrombin Time: 21 seconds — ABNORMAL HIGH (ref 11.4–15.2)

## 2015-08-07 LAB — TROPONIN I: TROPONIN I: 0.03 ng/mL — AB (ref <0.03)

## 2015-08-07 LAB — BRAIN NATRIURETIC PEPTIDE: B NATRIURETIC PEPTIDE 5: 1040 pg/mL — AB (ref 0.0–100.0)

## 2015-08-07 MED ORDER — SERTRALINE HCL 100 MG PO TABS
100.0000 mg | ORAL_TABLET | Freq: Every day | ORAL | Status: DC
  Administered 2015-08-07: 100 mg via ORAL
  Filled 2015-08-07: qty 1

## 2015-08-07 MED ORDER — ACETAMINOPHEN 325 MG PO TABS
650.0000 mg | ORAL_TABLET | Freq: Once | ORAL | Status: AC
  Administered 2015-08-07: 650 mg via ORAL
  Filled 2015-08-07: qty 2

## 2015-08-07 MED ORDER — SEVELAMER CARBONATE 800 MG PO TABS
800.0000 mg | ORAL_TABLET | Freq: Three times a day (TID) | ORAL | Status: DC
  Administered 2015-08-07 – 2015-08-08 (×4): 800 mg via ORAL
  Filled 2015-08-07 (×4): qty 1

## 2015-08-07 MED ORDER — WARFARIN - PHARMACIST DOSING INPATIENT: Freq: Every day | Status: DC

## 2015-08-07 MED ORDER — CETYLPYRIDINIUM CHLORIDE 0.05 % MT LIQD
7.0000 mL | Freq: Two times a day (BID) | OROMUCOSAL | Status: DC
  Administered 2015-08-07 (×2): 7 mL via OROMUCOSAL

## 2015-08-07 MED ORDER — HYDRALAZINE HCL 20 MG/ML IJ SOLN: 10.0000 mg | INTRAMUSCULAR | Status: DC | PRN

## 2015-08-07 MED ORDER — METOPROLOL TARTRATE 50 MG PO TABS
50.0000 mg | ORAL_TABLET | Freq: Two times a day (BID) | ORAL | Status: DC
  Administered 2015-08-07 – 2015-08-08 (×3): 50 mg via ORAL
  Filled 2015-08-07 (×3): qty 1

## 2015-08-07 MED ORDER — WARFARIN SODIUM 5 MG PO TABS: 5.0000 mg | ORAL_TABLET | Freq: Every day | ORAL | Status: DC

## 2015-08-07 MED ORDER — NITROGLYCERIN IN D5W 200-5 MCG/ML-% IV SOLN
0.0000 ug/min | Freq: Once | INTRAVENOUS | Status: DC
Start: 2015-08-07 — End: 2015-08-07

## 2015-08-07 MED ORDER — LOSARTAN POTASSIUM 25 MG PO TABS
100.0000 mg | ORAL_TABLET | Freq: Every day | ORAL | Status: DC
  Administered 2015-08-07 – 2015-08-08 (×2): 100 mg via ORAL
  Filled 2015-08-07 (×2): qty 4

## 2015-08-07 MED ORDER — NITROGLYCERIN 0.4 MG SL SUBL
SUBLINGUAL_TABLET | SUBLINGUAL | Status: AC
  Administered 2015-08-07: 0.4 mg via SUBLINGUAL
  Filled 2015-08-07: qty 3

## 2015-08-07 MED ORDER — PANTOPRAZOLE SODIUM 40 MG PO TBEC
40.0000 mg | DELAYED_RELEASE_TABLET | Freq: Every day | ORAL | Status: DC
  Administered 2015-08-07 – 2015-08-08 (×2): 40 mg via ORAL
  Filled 2015-08-07: qty 2
  Filled 2015-08-07 (×2): qty 1

## 2015-08-07 MED ORDER — NITROGLYCERIN 0.4 MG SL SUBL
0.4000 mg | SUBLINGUAL_TABLET | SUBLINGUAL | Status: DC | PRN
  Administered 2015-08-07: 0.4 mg via SUBLINGUAL

## 2015-08-07 MED ORDER — CINACALCET HCL 30 MG PO TABS
120.0000 mg | ORAL_TABLET | Freq: Every day | ORAL | Status: DC
  Administered 2015-08-07: 120 mg via ORAL
  Filled 2015-08-07: qty 4

## 2015-08-07 MED ORDER — WARFARIN SODIUM 5 MG PO TABS
5.0000 mg | ORAL_TABLET | ORAL | Status: DC
  Administered 2015-08-07: 5 mg via ORAL
  Filled 2015-08-07: qty 1

## 2015-08-07 MED ORDER — WARFARIN SODIUM 5 MG PO TABS: 7.5000 mg | ORAL_TABLET | ORAL | Status: DC

## 2015-08-07 MED ORDER — NITROGLYCERIN IN D5W 200-5 MCG/ML-% IV SOLN: 0.0000 ug/min | INTRAVENOUS | Status: DC

## 2015-08-07 MED ORDER — TRAMADOL HCL 50 MG PO TABS: 50.0000 mg | ORAL_TABLET | Freq: Four times a day (QID) | ORAL | Status: DC | PRN

## 2015-08-07 NOTE — Telephone Encounter (Signed)
Patient is in ICU room 18 - routine consult per Dr. Jamal Collin

## 2015-08-07 NOTE — Progress Notes (Signed)
ANTICOAGULATION CONSULT NOTE - Initial Consult  Pharmacy Consult for Coumadin Indication: h/o DVT  No Known Allergies  Patient Measurements: Weight: 160 lb 15 oz (73 kg)  Vital Signs: Temp: 97 F (36.1 C) (07/31 0730) Temp Source: Axillary (07/31 0730) BP: 151/102 (07/31 1045) Pulse Rate: 76 (07/31 1045)  Labs:  Recent Labs  08/07/15 0730  HGB 10.7*  HCT 32.9*  PLT 260  LABPROT 21.0*  INR 1.79  CREATININE 9.01*  TROPONINI 0.03*    Estimated Creatinine Clearance: 6.4 mL/min (by C-G formula based on SCr of 9.01 mg/dL).   Medical History: Past Medical History:  Diagnosis Date  . Anemia   . Anxiety   . CHF (congestive heart failure) ([place])   . Chronic kidney disease   . Depression   . Dialysis patient ([place])   . DVT (deep venous thrombosis) (H[facility])    left leg  . DVT (deep venous thrombosis) ([place])   . Gout   . HTN (hypertension)   . Hypertension   . Parathyroid abnormality ([place])   . Parathyroid disease ([place])   . Psoriasis     Medications:  Prescriptions Prior to Admission  Medication Sig Dispense Refill Last Dose  . cinacalcet (SENSIPAR) 30 MG tablet Take 120 mg by mouth.   unknown at unknown  . clonazePAM (KLONOPIN) 0.5 MG tablet Take 1/2 tablet before dialysis, can take a whole tablet if needed   unknown at unknown  . losartan (COZAAR) 50 MG tablet Take 100 mg by mouth daily.    unknown at unknown  . metoprolol (LOPRESSOR) 100 MG tablet Take 50 mg by mouth 2 (two) times daily.   unknown at unknown  . pantoprazole (PROTONIX) 20 MG tablet Take 20 mg by mouth daily.    unknown at unknown  . sertraline (ZOLOFT) 100 MG tablet Take 100 mg by mouth at bedtime.    unknown at unknown  . sevelamer carbonate (RENVELA) 800 MG tablet Take 2,400 mg by mouth.   unknown at unknown  . warfarin (COUMADIN) 5 MG tablet Take 1 tablet Monday and Friday. Take 1 1/2 tablets the other days of the week   unknown at unknown  . zolpidem (AMBIEN) 10 MG tablet Take 10 mg by mouth at  bedtime as needed for sleep.   unknown at unknown  . traMADol (ULTRAM) 50 MG tablet Take 50 mg by mouth every 6 (six) hours as needed.    unknown at unknown   Scheduled:  . cinacalcet  120 mg Oral Q supper  . losartan  100 mg Oral Daily  . metoprolol  50 mg Oral BID  . pantoprazole  40 mg Oral Daily  . sertraline  100 mg Oral QHS  . sevelamer carbonate  800 mg Oral TID WC  . [START ON 08/08/2015] warfarin  7.5 mg Oral Once per day on Sun Tue Wed Thu Sat   And  . warfarin  5 mg Oral Once per day on Mon Fri  . Warfarin - Pharmacist Dosing Inpatient   Does not apply q1800    Assessment: 60 y/o F with a h/o DVT on Coumadin 7.5 mg daily except 5 mg M/F admitted with pulmonary edema. INR is subtherapeutic on admission. Not sure when patient had last dos.   7/31 INR: 1.79  Goal of Therapy:  INR 2-3   Plan:  INR is subtherapeutic. Will continue outpatient dosing of Coumadin for now. Will f/u AM INR.    Ulice Dash D 08/07/2015,10:53 AM

## 2015-08-07 NOTE — ED Triage Notes (Signed)
Pt arrives to ed via ems from home with respiratory distress per husband, not sure when she became sick. Pt is currently on dialysis and should recve dialysis on M-W-F. Per ems pt with CHF and HTN. Pt vomiting on arrival, ems gave 4mg  Zofran, one inch of nitro paste to right upper chest and one nitro spray. Pt hypertensive on arrival to ed. Pt currently placed on Bipap per respiratory.  CBG 150.  Cont to monitor.

## 2015-08-07 NOTE — Progress Notes (Signed)
[facility]  ROUNDING NOTE   Subjective:   Presented to ED with flash pulmonary edema. Placed on BiPap.  Emergent hemodialysis ordered.   Objective:  Vital signs in last 24 hours:  Temp:  [97 F (36.1 C)] 97 F (36.1 C) (07/31 0730) Pulse Rate:  [88-93] 88 (07/31 0811) Resp:  [24-38] 24 (07/31 0811) BP: (134-191)/(103-145) 134/103 (07/31 0811) SpO2:  [100 %] 100 % (07/31 0811) Weight:  [71.8 kg (158 lb 3.2 oz)] 71.8 kg (158 lb 3.2 oz) (07/31 0724)  Weight change:  Filed Weights   08/07/15 0724  Weight: 71.8 kg (158 lb 3.2 oz)    Intake/Output: No intake/output data recorded.   Intake/Output this shift:  No intake/output data recorded.  Physical Exam: General: Critically ill  Head: Normocephalic, atraumatic. Moist oral mucosal membranes  Eyes: Anicteric, PERRL  Neck: Supple, trachea midline  Lungs:  Bilateral crackles, wheezing, +BIPAP  Heart: Regular rate and rhythm  Abdomen:  Soft, nontender  Extremities: trace peripheral edema.  Neurologic: Nonfocal, moving all four extremities  Skin: No lesions  Access: RIJ permcath    Basic Metabolic Panel:  Recent Labs Lab 08/07/15 0730  NA 141  K 4.7  CL 107  CO2 20*  GLUCOSE 173*  BUN 53*  CREATININE 9.01*  CALCIUM 7.7*    Liver Function Tests:  Recent Labs Lab 08/07/15 0730  AST 19  ALT 14  ALKPHOS 1,043*  BILITOT 0.6  PROT 7.2  ALBUMIN 3.6   No results for input(s): LIPASE, AMYLASE in the last 168 hours. No results for input(s): AMMONIA in the last 168 hours.  CBC:  Recent Labs Lab 08/07/15 0730  WBC 18.0*  HGB 10.7*  HCT 32.9*  MCV 88.6  PLT 260    Cardiac Enzymes:  Recent Labs Lab 08/07/15 0730  TROPONINI 0.03*    BNP: Invalid input(s): POCBNP  CBG:  Recent Labs Lab 08/07/15 0846  GLUCAP 6    Microbiology: Results for orders placed or performed during the hospital encounter of 07/09/15  MRSA PCR Screening     Status: None   Collection Time: 07/09/15   4:21 PM  Result Value Ref Range Status   MRSA by PCR NEGATIVE NEGATIVE Final    Comment:        The GeneXpert MRSA Assay (FDA approved for NASAL specimens only), is one component of a comprehensive MRSA colonization surveillance program. It is not intended to diagnose MRSA infection nor to guide or monitor treatment for MRSA infections.     Coagulation Studies:  Recent Labs  08/07/15 0730  LABPROT 21.0*  INR 1.79    Urinalysis: No results for input(s): COLORURINE, LABSPEC, PHURINE, GLUCOSEU, HGBUR, BILIRUBINUR, KETONESUR, PROTEINUR, UROBILINOGEN, NITRITE, LEUKOCYTESUR in the last 72 hours.  Invalid input(s): APPERANCEUR    Imaging: Dg Chest Port 1 View  Result Date: 08/07/2015 CLINICAL DATA:  Respiratory distress.  Chronic renal failure EXAM: PORTABLE CHEST 1 VIEW COMPARISON:  July 11, 2015. FINDINGS: There is generalized interstitial pulmonary edema. There is probable mild alveolar edema in the right base. There is cardiomegaly with mild pulmonary venous hypertension. There is atherosclerotic calcification in the aorta. Central catheter tip is in the superior vena cava near the cavoatrial junction. No pneumothorax. No evident bone lesions. IMPRESSION: Evidence of a degree of congestive heart failure. Suspect alveolar edema in the right base, although early pneumonia in this area is possible. There is aortic atherosclerosis. No pneumothorax. Electronically Signed   By: Lowella Grip III M.D.   On:  08/07/2015 07:50    Medications:   . nitroGLYCERIN     . cinacalcet  120 mg Oral Q supper  . losartan  100 mg Oral Daily  . metoprolol  50 mg Oral BID  . pantoprazole  40 mg Oral Daily  . sertraline  100 mg Oral QHS  . sevelamer carbonate  800 mg Oral TID WC  . warfarin  5 mg Oral q1800   nitroGLYCERIN, traMADol  Assessment/ Plan:  [name] is a 60 y.o. black female with end-stage renal disease, hypertension, cardiomyopathy, chronic systolic/diastolic  congestive heart failure admitted on 08/07/2015 for Respiratory failure ([place]) [J96.90] Acute pulmonary edema (HCC) [J81.0] Respiratory distress [R06.00] Acute respiratory failure with hypoxia ([place]) [J96.01]   MWF [facility]  1. End Stage Renal Disease: with flash pulmonary edema and acute respiratory failure: emergent dialysis for today. UF goal of 3 litres. Will monitor daily for dialysis need.   2. Anemia of chronic kidney disease: no indication for epo with hypertension and pulmonary edema.   3. Hypertension: urgency. Uf with dialysis treatment.  - home regimen of losartan and metoprolol.   4. Secondary Hyperparathyroidism:  - cinacalcet - sevelamer with meals.    LOS: 0 Teresa Cook 7/31/20179:08 AM

## 2015-08-07 NOTE — ED Notes (Signed)
Pt currently on Bipap. Pt a/ox3. Pt c/o low back pain 3/10, pt repositioned for comfort.

## 2015-08-07 NOTE — ED Provider Notes (Signed)
[facility] Emergency Department Provider Note   ____________________________________________   First MD Initiated Contact with Patient 08/07/15 0719     (approximate)  I have reviewed the triage vital signs and the nursing notes.   HISTORY  Chief Complaint Respiratory Distress  EM caveat: The patient is extremely short of breath, unable to speak short phrases or words  HPI Teresa Cook is a 60 y.o. female who presents for hypoxia  EMS reports patient called for respiratory distress. They noted significant hypertension, and she is a dialysis patient. Oxygen saturation 60% on arrival of EMS. She was placed on CPAP, given nitroglycerin sublingual and paste, and transported emergently for concerns of possible flash pulmonary edema.  Past Medical History:  Diagnosis Date  . Anemia   . Anxiety   . CHF (congestive heart failure) ([place])   . Chronic kidney disease   . Depression   . Dialysis patient ([place])   . DVT (deep venous thrombosis) (HCC)    left leg  . DVT (deep venous thrombosis) ([place])   . Gout   . HTN (hypertension)   . Hypertension   . Parathyroid abnormality ([place])   . Parathyroid disease ([place])   . Psoriasis     Patient Active Problem List   Diagnosis Date Noted  . Breast cancer of upper-inner quadrant of left female breast ([place]) 08/05/2015  . Pulmonary edema 07/09/2015  . Dizziness 03/03/2015  . Seizures ([place]) 03/03/2015  . Spells ([place]) 11/11/2014  . Acute respiratory failure ([place]) 09/19/2014  . Left ventricular dysfunction 09/06/2014  . Acute pulmonary edema ([place]) 08/30/2014  . Respiratory failure ([place]) 08/29/2014  . Respiratory difficulty 08/29/2014  . Chronic systolic heart failure ([place]) 05/16/2014  . Hypertension 05/16/2014  . Renal failure 05/16/2014  . Anxiety 05/16/2014  . Sickle cell trait ([place]) 03/03/2014  . ESRD (end stage renal disease) on dialysis ([place]) 11/02/2013  . Dysgeusia 01/27/2013  . Weight loss 01/27/2013    . Resting tremor 08/13/2012  . Depression 04/30/2012  . 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 Right leg DVT ([place]) 04/30/2012    Past Surgical History:  Procedure Laterality Date  . ABDOMINAL HYSTERECTOMY    . APPENDECTOMY    . BREAST BIOPSY Left 10/28/2013   benign  . BREAST EXCISIONAL BIOPSY Left 2002   benign  . PARTIAL HYSTERECTOMY      Prior to Admission medications   Medication Sig Start Date End Date Taking? Authorizing Provider  cinacalcet (SENSIPAR) 30 MG tablet Take 120 mg by mouth. 06/19/15  Yes Historical Provider, MD  clonazePAM (KLONOPIN) 0.5 MG tablet Take 1/2 tablet before dialysis, can take a whole tablet if needed 06/19/15  Yes Historical Provider, MD  losartan (COZAAR) 50 MG tablet Take 100 mg by mouth daily.  09/07/14  Yes Historical Provider, MD  metoprolol (LOPRESSOR) 100 MG tablet Take 50 mg by mouth 2 (two) times daily.   Yes Historical Provider, MD  pantoprazole (PROTONIX) 20 MG tablet Take 20 mg by mouth daily.    Yes Historical Provider, MD  sertraline (ZOLOFT) 100 MG tablet Take 100 mg by mouth at bedtime.  03/05/12  Yes Historical Provider, MD  sevelamer carbonate (RENVELA) 800 MG tablet Take 2,400 mg by mouth.   Yes Historical Provider, MD  warfarin (COUMADIN) 5 MG tablet Take 1 tablet Monday and Friday. Take 1 1/2 tablets the other days of the week 11/03/13  Yes Historical Provider, MD  zolpidem (AMBIEN) 10 MG tablet Take 10 mg by mouth at  bedtime as needed for sleep.   Yes Historical Provider, MD  traMADol (ULTRAM) 50 MG tablet Take 50 mg by mouth every 6 (six) hours as needed.  02/20/15   Historical Provider, MD    Allergies Review of patient's allergies indicates no known allergies.  Family History  Problem Relation Age of Onset  . Stroke Mother   . CVA Mother   . Hypertension Mother   . Hypertension Father   . Hypertension Sister   . Diabetes Sister   . Stroke Brother   . Hypertension Brother   . Breast cancer Maternal Aunt  70    Social History Social History  Substance Use Topics  . Smoking status: Never Smoker  . Smokeless tobacco: Never Used  . Alcohol use No    Review of Systems EM caveat   ____________________________________________   PHYSICAL EXAM:  VITAL SIGNS: ED Triage Vitals  Enc Vitals Group     BP 08/07/15 0724 (!) 191/135     Pulse Rate 08/07/15 0724 92     Resp 08/07/15 0724 (!) 38     Temp 08/07/15 0730 97 F (36.1 C)     Temp Source 08/07/15 0730 Axillary     SpO2 08/07/15 0724 100 %     Weight 08/07/15 0724 158 lb 3.2 oz (71.8 kg)     Height --      Head Circumference --      Peak Flow --      Pain Score 08/07/15 0726 3     Pain Loc --      Pain Edu? --      Excl. in [place]? --     Constitutional: Somnolent, on CPAP. Patient sitting up, trying to vomit into a mask on arrival. Patient taken off CPAP and transferred on a recent nonrebreather, rolled to the side where she vomited. Patient then calm, and was placed on BiPAP which she is tolerating well thereafter. Eyes: Conjunctivae are normal. PERRL. EOMI. Head: Atraumatic. Nose: No congestion/rhinnorhea. Mouth/Throat: Mucous membranes are moist.  Oropharynx non-erythematous. Neck: No stridor. Significant JVD Cardiovascular: Tachycardic rate, regular rhythm. Grossly normal heart sounds.  Good peripheral circulation. Respiratory: Moderate to severe increased work of breathing, frequent but shallow inspirations with rails bilateral through the mid thorax. Gastrointestinal: Soft and nontender.  Musculoskeletal: No lower extremity tenderness nor edema. Neurologic:  Able to answer only simple questions such as her name. She does move all extremities with what appears to be equal strength. Skin:  Skin is warm, dry and intact. No rash noted. Psychiatric: Mood and affect are anxious. ____________________________________________   LABS (all labs ordered are listed, but only abnormal results are displayed)  Labs Reviewed  CBC -  Abnormal; Notable for the following:       Result Value   WBC 18.0 (*)    RBC 3.71 (*)    Hemoglobin 10.7 (*)    HCT 32.9 (*)    RDW 16.2 (*)    All other components within normal limits  BLOOD GAS, VENOUS - Abnormal; Notable for the following:    pH, Ven 7.21 (*)    pO2, Ven <31.0 (*)    Bicarbonate 20.4 (*)    Acid-base deficit 7.5 (*)    All other components within normal limits  PROTIME-INR - Abnormal; Notable for the following:    Prothrombin Time 21.0 (*)    All other components within normal limits  COMPREHENSIVE METABOLIC PANEL  TROPONIN I  BRAIN NATRIURETIC PEPTIDE   ____________________________________________  EKG  Reviewed and interpreted by me at 7:27 AM Heart rate 90 QRS 90 QTC 500 Normal sinus rhythm, changes consistent with left ventricular hypertrophy, no evidence for acute MI, though cannot rule out ischemic changes ____________________________________________  RADIOLOGY  Dg Chest Port 1 View  Result Date: 08/07/2015 CLINICAL DATA:  Respiratory distress.  Chronic renal failure EXAM: PORTABLE CHEST 1 VIEW COMPARISON:  July 11, 2015. FINDINGS: There is generalized interstitial pulmonary edema. There is probable mild alveolar edema in the right base. There is cardiomegaly with mild pulmonary venous hypertension. There is atherosclerotic calcification in the aorta. Central catheter tip is in the superior vena cava near the cavoatrial junction. No pneumothorax. No evident bone lesions. IMPRESSION: Evidence of a degree of congestive heart failure. Suspect alveolar edema in the right base, although early pneumonia in this area is possible. There is aortic atherosclerosis. No pneumothorax. Electronically Signed   By: Lowella Grip III M.D.   On: 08/07/2015 07:50   ____________________________________________   PROCEDURES  Procedure(s) performed: None  Procedures  Critical Care performed: Yes, see critical care note(s)  CRITICAL CARE Performed by: Delman Kitten   Total critical care time: 65 minutes  Critical care time was exclusive of separately billable procedures and treating other patients.  Critical care was necessary to treat or prevent imminent or life-threatening deterioration.  Critical care was time spent personally by me on the following activities: development of treatment plan with patient and/or surrogate as well as nursing, discussions with consultants, evaluation of patient's response to treatment, examination of patient, obtaining history from patient or surrogate, ordering and performing treatments and interventions, ordering and review of laboratory studies, ordering and review of radiographic studies, pulse oximetry and re-evaluation of patient's condition.  Critical care involves reevaluation, discussion and consultation with nephrology and critical care medicine on a stat basis. Patient requiring BiPAP, close management of blood pressure, titration of nitroglycerin. ____________________________________________   INITIAL IMPRESSION / ASSESSMENT AND PLAN / ED COURSE  Pertinent labs & imaging results that were available during my care of the patient were reviewed by me and considered in my medical decision making (see chart for details).  Patient is for acute respiratory failure. Patient transitioned to BiPAP, showing improvement. The patient is not febrile, and her history of being due for dialysis today and review of her chest x-ray and presentation with hypertension, hypoxia, seemed very most consistent with pulmonary edema and volume overload. However, I cannot exclude other diagnoses such as early pneumonia. Discussed with and will admit the patient to the ICU with nephrology consultation for the need for emergent dialysis  Clinical Course   ----------------------------------------- 8:03 AM on 08/07/2015 -----------------------------------------  The patient is resting much more comfortably now. She reports  significant improvement. She is resting, awaiting nephrology and critical care consults. The patient appears much improved.    ____________________________________________   FINAL CLINICAL IMPRESSION(S) / ED DIAGNOSES  Final diagnoses:  Acute respiratory failure with hypoxia (HCC)  Respiratory distress  Acute pulmonary edema (HCC)      NEW MEDICATIONS STARTED DURING THIS VISIT:  New Prescriptions   No medications on file     Note:  This document was prepared using Dragon voice recognition software and may include unintentional dictation errors.     Delman Kitten, MD 08/07/15 [phone]

## 2015-08-07 NOTE — Progress Notes (Signed)
POST DIALYSIS ASSESSMENT 

## 2015-08-07 NOTE — Care Management (Addendum)
Patient presents from home with significant shortness of breath initially requiring bipap.  She had a diastolic blood pressure of greater than 100.  Currently on nasal cannula at 4 liters.  Need for 02 is acute.  Sx similar to previous admission with flash pulmonary edema.   She has esrd with HD on MWF.  Recent diagnosis of left breast cancer.  Was to have seen oncology 8/2 to discuss course of treatment.  Patient feeling overwhelmed with her current health issues.  Says she follows all orders "to a T' and look at me... I just keep coming back to the hospital."  Does admit to "feeling so good a few days back that she was outside.. went for walks and did not think about being sick.  Admits to not taking her medication on those days but has learned her lesson she says.  Verbalizes that God has abandoned her. CM provided some spiritual support and made referral to pastoral care.  Patient should be assessed for need of home 02 prior to discharge and may benefit from home health nurse consideration.

## 2015-08-07 NOTE — H&P (Signed)
PULMONARY / CRITICAL CARE MEDICINE   Name: Teresa Cook MRN: [id number] DOB: 1955-08-25    ADMISSION DATE:  08/07/2015   HISTORY OF PRESENT ILLNESS:   89 F with Htn, ESRD, recent diagnosis of breast cancer admitted via ED with acute hypoxic respiratory failure. She had a very similar admission earlier in this month. Echocardiogram @ that time revealed LVEF 40-45% with diffuse HK and mild LVH. She is supported on NPPV and comfortable. Denies CP, fever, purulent sputum, hemoptysis, LE edema and calf tenderness.    PAST MEDICAL HISTORY :  She  has a past medical history of Anemia; Anxiety; CHF (congestive heart failure) ([place]); Chronic kidney disease; Depression; Dialysis patient [facility]); DVT (deep venous thrombosis) ([place]); DVT (deep venous thrombosis) ([place]); Gout; HTN (hypertension); Hypertension; Parathyroid abnormality ([place]); Parathyroid disease ([place]); and Psoriasis.  PAST SURGICAL HISTORY: She  has a past surgical history that includes Partial hysterectomy; Abdominal hysterectomy; Appendectomy; Breast biopsy (Left, 10/28/2013); and Breast excisional biopsy (Left, 2002).  No Known Allergies  No current facility-administered medications on file prior to encounter.    Current Outpatient Prescriptions on File Prior to Encounter  Medication Sig  . metoprolol (LOPRESSOR) 100 MG tablet Take 50 mg by mouth 2 (two) times daily.  Marland Kitchen zolpidem (AMBIEN) 10 MG tablet Take 10 mg by mouth at bedtime as needed for sleep.    FAMILY HISTORY:  Her indicated that the status of her mother is unknown. She indicated that the status of her father is unknown. She indicated that the status of her sister is unknown. She indicated that the status of her brother is unknown. She indicated that the status of her maternal aunt is unknown.    SOCIAL HISTORY: She  reports that she has never smoked. She has never used smokeless tobacco. She reports that she does not drink alcohol or use drugs.  REVIEW OF SYSTEMS:    Review of Systems  Constitutional: Negative for chills, diaphoresis, fever, malaise/fatigue and weight loss.  HENT: Negative for nosebleeds and sore throat.        HA after NTG paste placed  Eyes: Negative.   Respiratory: Positive for shortness of breath.   Cardiovascular: Positive for orthopnea. Negative for chest pain, palpitations, claudication, leg swelling and PND.  Gastrointestinal: Negative.   Genitourinary: Negative.   Musculoskeletal: Negative.   Skin: Negative for itching and rash.  Neurological: Positive for headaches.  All other systems reviewed and are negative.    SUBJECTIVE:    VITAL SIGNS: BP (!) 177/119   Pulse 75   Temp 97 F (36.1 C) (Axillary)   Resp (!) 25   Wt 160 lb 15 oz (73 kg)   SpO2 99%   BMI 25.21 kg/m   HEMODYNAMICS:    VENTILATOR SETTINGS:    INTAKE / OUTPUT: No intake/output data recorded.  PHYSICAL EXAMINATION: WDWN on BiPAP, no overt distress, cognition intact HEENT: NCAT, sclera white, oropharynx normall + JVD Bibasilar crackles, no wheezes Reg, no M noted NABS, soft No C/C/E No focal deficits  LABS:  BMET  Recent Labs Lab 08/07/15 0730  NA 141  K 4.7  CL 107  CO2 20*  BUN 53*  CREATININE 9.01*  GLUCOSE 173*    Electrolytes  Recent Labs Lab 08/07/15 0730  CALCIUM 7.7*    CBC  Recent Labs Lab 08/07/15 0730  WBC 18.0*  HGB 10.7*  HCT 32.9*  PLT 260    Coag's  Recent Labs Lab 08/07/15 0730  INR 1.79    Sepsis Markers  No results for input(s): LATICACIDVEN, PROCALCITON, O2SATVEN in the last 168 hours.  ABG No results for input(s): PHART, PCO2ART, PO2ART in the last 168 hours.  Liver Enzymes  Recent Labs Lab 08/07/15 0730  AST 19  ALT 14  ALKPHOS 1,043*  BILITOT 0.6  ALBUMIN 3.6    Cardiac Enzymes  Recent Labs Lab 08/07/15 0730  TROPONINI 0.03*    Glucose  Recent Labs Lab 08/07/15 0846  GLUCAP 99    Imaging Dg Chest Port 1 View  Result Date:  08/07/2015 CLINICAL DATA:  Respiratory distress.  Chronic renal failure EXAM: PORTABLE CHEST 1 VIEW COMPARISON:  July 11, 2015. FINDINGS: There is generalized interstitial pulmonary edema. There is probable mild alveolar edema in the right base. There is cardiomegaly with mild pulmonary venous hypertension. There is atherosclerotic calcification in the aorta. Central catheter tip is in the superior vena cava near the cavoatrial junction. No pneumothorax. No evident bone lesions. IMPRESSION: Evidence of a degree of congestive heart failure. Suspect alveolar edema in the right base, although early pneumonia in this area is possible. There is aortic atherosclerosis. No pneumothorax. Electronically Signed   By: Lowella Grip III M.D.   On: 08/07/2015 07:50    DISCUSSION: Flash pulmonary edema, ESRD, severe hypertension (likely precipitating cause  ASSESSMENT: Acute hypoxic respiratory failure Pulmonary edema History of remote DVT on long term anticoagulation ESRD Recent diagnosis os adenocarcinoma of the L breast - still undergoing evaluation  Seen by Dr Grayland Ormond  Was to be seen by Surgery for possible resection of primary tumor this week  PLAN: Admit to ICU/SDU for PRN NPPV Nephrology notified for urgent HD Continue outpt losartan, metoprolol PRN hydralazine to maintain SBP < 150 mmHg Continue warfarin Will notify Dr Grayland Ormond of pt's hospitalization  Merton Border, MD PCCM service Mobile [phone] Pager [phone]  08/07/2015, 11:21 AM

## 2015-08-07 NOTE — Progress Notes (Signed)
HD STARTED  

## 2015-08-07 NOTE — Progress Notes (Signed)
   08/07/15 2000  Clinical Encounter Type  Visited With Patient  Visit Type Initial  Referral From Nurse  Consult/Referral To Chaplain  Patient is a 60 year old African American female with stage 2 breast cancer and renal failure with other underlying health issues. She is fearful of what the future holds but benefits talking with someone who she can be candid and transparent with. I will continue to follow her through her stay here and as she goes through treatment at the cancer center.   [place] [phone]

## 2015-08-07 NOTE — ED Notes (Signed)
Verbal order to hold nitro drip at this time. Pt received two  Nitro tablets SL and blood pressure now 134/103.

## 2015-08-07 NOTE — Telephone Encounter (Signed)
[name] called to let us know that Teresa Cook is currently in ICU from a Hypertensive event. She may or may not be at this appointment.

## 2015-08-07 NOTE — Progress Notes (Signed)
PRE DIALYSIS ASSESSMENT 

## 2015-08-07 NOTE — Progress Notes (Signed)
HD COMPLETED  

## 2015-08-08 ENCOUNTER — Inpatient Hospital Stay: Payer: Medicare Other

## 2015-08-08 DIAGNOSIS — J9621 Acute and chronic respiratory failure with hypoxia: Secondary | ICD-10-CM

## 2015-08-08 LAB — CBC
HEMATOCRIT: 26.6 % — AB (ref 35.0–47.0)
HEMATOCRIT: 27.4 % — AB (ref 35.0–47.0)
HEMOGLOBIN: 9.1 g/dL — AB (ref 12.0–16.0)
Hemoglobin: 9.4 g/dL — ABNORMAL LOW (ref 12.0–16.0)
MCH: 29 pg (ref 26.0–34.0)
MCH: 29.4 pg (ref 26.0–34.0)
MCHC: 34.1 g/dL (ref 32.0–36.0)
MCHC: 34.2 g/dL (ref 32.0–36.0)
MCV: 85.2 fL (ref 80.0–100.0)
MCV: 85.9 fL (ref 80.0–100.0)
PLATELETS: 160 10*3/uL (ref 150–440)
Platelets: 139 10*3/uL — ABNORMAL LOW (ref 150–440)
RBC: 3.13 MIL/uL — ABNORMAL LOW (ref 3.80–5.20)
RBC: 3.19 MIL/uL — AB (ref 3.80–5.20)
RDW: 15.8 % — ABNORMAL HIGH (ref 11.5–14.5)
RDW: 16 % — AB (ref 11.5–14.5)
WBC: 8.6 10*3/uL (ref 3.6–11.0)
WBC: 8.6 10*3/uL (ref 3.6–11.0)

## 2015-08-08 LAB — RENAL FUNCTION PANEL
ALBUMIN: 3.2 g/dL — AB (ref 3.5–5.0)
Anion gap: 10 (ref 5–15)
BUN: 42 mg/dL — AB (ref 6–20)
CO2: 28 mmol/L (ref 22–32)
Calcium: 7.6 mg/dL — ABNORMAL LOW (ref 8.9–10.3)
Chloride: 100 mmol/L — ABNORMAL LOW (ref 101–111)
Creatinine, Ser: 7.36 mg/dL — ABNORMAL HIGH (ref 0.44–1.00)
GFR, EST AFRICAN AMERICAN: 6 mL/min — AB (ref >60)
GFR, EST NON AFRICAN AMERICAN: 5 mL/min — AB (ref >60)
Glucose, Bld: 104 mg/dL — ABNORMAL HIGH (ref 65–99)
PHOSPHORUS: 4.6 mg/dL (ref 2.5–4.6)
POTASSIUM: 4.3 mmol/L (ref 3.5–5.1)
Sodium: 138 mmol/L (ref 135–145)

## 2015-08-08 LAB — BASIC METABOLIC PANEL
ANION GAP: 11 (ref 5–15)
BUN: 38 mg/dL — AB (ref 6–20)
CHLORIDE: 100 mmol/L — AB (ref 101–111)
CO2: 27 mmol/L (ref 22–32)
Calcium: 7.2 mg/dL — ABNORMAL LOW (ref 8.9–10.3)
Creatinine, Ser: 6.87 mg/dL — ABNORMAL HIGH (ref 0.44–1.00)
GFR calc Af Amer: 7 mL/min — ABNORMAL LOW (ref >60)
GFR, EST NON AFRICAN AMERICAN: 6 mL/min — AB (ref >60)
GLUCOSE: 97 mg/dL (ref 65–99)
POTASSIUM: 4.2 mmol/L (ref 3.5–5.1)
Sodium: 138 mmol/L (ref 135–145)

## 2015-08-08 LAB — PROTIME-INR
INR: 1.51
Prothrombin Time: 18.4 seconds — ABNORMAL HIGH (ref 11.4–15.2)

## 2015-08-08 LAB — TROPONIN I: Troponin I: 0.05 ng/mL (ref <0.03)

## 2015-08-08 MED ORDER — ACETAMINOPHEN 325 MG PO TABS
650.0000 mg | ORAL_TABLET | Freq: Once | ORAL | Status: AC
  Administered 2015-08-08: 650 mg via ORAL

## 2015-08-08 NOTE — Progress Notes (Signed)
Pre HD  

## 2015-08-08 NOTE — Progress Notes (Signed)
PRE HD ASSESSMENT 

## 2015-08-08 NOTE — Progress Notes (Signed)
No distress noted. bipap on standby

## 2015-08-08 NOTE — Progress Notes (Signed)
Start of hd 

## 2015-08-08 NOTE — Discharge Summary (Signed)
Physician Discharge Summary  Patient ID: Teresa Cook MRN: [id number] DOB/AGE: 1956/01/03 60 y.o.  Admit date: 08/07/2015 Discharge date: 08/08/2015    Discharge Diagnoses:  Hypertension Induced Flash Pulmonary Edema  Essential Hypertension ESRD on Hemodialysis Leukocytosis Hx: DVT Elevated troponin likely demand ischemia       DISCHARGE PLAN BY DIAGNOSIS   #1 Hypertension Induced Pulmonary Edema Plan: Recommend adjustment of antihypertensive medications by her outpatient Nephrologist for better blood pressure control.  This is her second admission to the hospital in 1 month due to Hypertension Induced Pulmonary Edema.  Continue dialysis per Nephrology recommendations.  No recommendations for home O2 at this time. Follow-up in 1 to 2 weeks with PCP at [facility].  #2 Essential Hypertension Plan: Poorly controlled recommend outpatient Nephrologist adjust antihypertensive medications.  #3 Elevated Troponin  Plan: likely demand ischemia in the setting of pulmonary edema continue metoprolol   #4 Hx of remote DVT in left leg Plan: Continue chronic coumadin   #5 ESRD Plan: Resume outpatient hemodialysis on 08/09/2015 Continue dialysis per Nephrology recommendations  #6  Recent diagnosis of os adenocarcinoma of the left breast Plan: Still undergoing evaluation by Dr. Grayland Ormond and she has an appointment scheduled with Surgery on 08/08/2015 at 02:00 pm for possible resection of primary tumor        DISCHARGE SUMMARY   Teresa Cook is a 60 y.o. y/o female with a PMH of Hypertension, Gout, DVT in left leg, Parathyroid Disease, CKD on hemodialysis M, W, F, Depression, CHF, Anemia, Anxiety, and Psoriasis.  She presented to [facility] ER on 08/07/15 with c/o worsening shortness of breath.  She had a very similar admission earlier this month.  Echocardiogram at that time revealed LVEF 40-45% with diffuse HK and mild LVH.  This admission she was diagnosed with  Hypertension Induced Flash Pulmonary Edema requiring bipap and emergent hemodialysis with a UF goal of 3 liters.   SIGNIFICANT DIAGNOSTIC STUDIES None  SIGNIFICANT EVENTS 7/31-Pt admitted to [facility] ICU for acute hypoxic respiratory failure secondary to hypertension induced flash pulmonary edema requiring bipap and emergent hemodialysis  MICRO DATA  None  ANTIBIOTICS None  CONSULTS Nephrology-Dr. Juleen China Oncology-Dr. Grayland Ormond   TUBES / LINES Right chest Permcath    Discharge Exam: General: resting in bed, in NAD, well developed, well nourished Neuro: A&O x 3, non-focal HEENT: [place]/AT. PERRL, sclerae anicteric. Cardiovascular: clear throughout, RRR, no M/R/G.  Lungs: Respirations even and unlabored.  CTA bilaterally Abdomen: BS x 4, soft, NT/ND.  Musculoskeletal: No gross deformities, no edema.  Skin: Intact, warm, no rashes.   Vitals:   08/08/15 1320 08/08/15 1352 08/08/15 1353 08/08/15 1400  BP: 110/64 106/69 106/69 118/79  Pulse: 89 87 94 83  Resp: _0 Temp:  98.2 F (36.8 C)    TempSrc:      SpO2: 100% 100%  99%  Weight:      Height:         Discharge Labs  BMET  Recent Labs Lab 08/07/15 0730 08/08/15 0529 08/08/15 1000  NA 141 138 138  K 4.7 4.2 4.3  CL 107 100* 100*  CO2 20* 27 28  GLUCOSE 173* 97 104*  BUN 53* 38* 42*  CREATININE 9.01* 6.87* 7.36*  CALCIUM 7.7* 7.2* 7.6*  PHOS  --   --  4.6    CBC  Recent Labs Lab 08/07/15 0730 08/08/15 0529 08/08/15 1000  HGB 10.7* 9.1* 9.4*  HCT 32.9* 26.6* 27.4*  WBC 18.0* 8.6 8.6  PLT 260 139* 160    Anti-Coagulation  Recent Labs Lab 08/07/15 0730 08/08/15 0529  INR 1.79 1.51    Discharge Instructions    Diet - low sodium heart healthy    Complete by:  As directed   Increase activity slowly    Complete by:  As directed       Follow-up Information    [facility] .   Specialty:  General Practice Why:  Follow-up appointment with PCP in 1 to 2 weeks Contact  information: [place]. [place] [address] [phone]              Medication List    TAKE these medications   cinacalcet 30 MG tablet Commonly known as:  SENSIPAR Take 120 mg by mouth.   clonazePAM 0.5 MG tablet Commonly known as:  KLONOPIN Take 1/2 tablet before dialysis, can take a whole tablet if needed   losartan 50 MG tablet Commonly known as:  COZAAR Take 100 mg by mouth daily.   metoprolol 100 MG tablet Commonly known as:  LOPRESSOR Take 50 mg by mouth 2 (two) times daily.   pantoprazole 20 MG tablet Commonly known as:  PROTONIX Take 20 mg by mouth daily.   sertraline 100 MG tablet Commonly known as:  ZOLOFT Take 100 mg by mouth at bedtime.   sevelamer carbonate 800 MG tablet Commonly known as:  RENVELA Take 2,400 mg by mouth.   traMADol 50 MG tablet Commonly known as:  ULTRAM Take 50 mg by mouth every 6 (six) hours as needed.   warfarin 5 MG tablet Commonly known as:  COUMADIN Take 1 tablet Monday and Friday. Take 1 1/2 tablets the other days of the week   zolpidem 10 MG tablet Commonly known as:  AMBIEN Take 10 mg by mouth at bedtime as needed for sleep.        Disposition: Stable  Discharged Condition: Teresa Cook has met maximum benefit of inpatient care and is medically stable and cleared for discharge.  Patient is pending follow up as above.     Marda Stalker, [place] Pager [phone] (please enter 7 digits) PCCM Consult Pager [phone] (please enter 7 digits)   Plan as above discussed in detail  Merton Border, MD PCCM service Mobile [phone] Pager [phone] 08/08/2015

## 2015-08-08 NOTE — Progress Notes (Signed)
Patient d/c'd home. Education provided, no questions at this time. Patient picked up by husband. Teresa Cook

## 2015-08-08 NOTE — Progress Notes (Signed)
ANTICOAGULATION CONSULT NOTE - Initial Consult  Pharmacy Consult for Coumadin Indication: h/o DVT  No Known Allergies  Patient Measurements: Height: 5\' 6"  (167.6 cm) Weight: 157 lb 13.6 oz (71.6 kg) IBW/kg (Calculated) : 59.3  Vital Signs: Temp: 98.8 F (37.1 C) (08/01 1005) Temp Source: Oral (08/01 1005) BP: 134/84 (08/01 1030) Pulse Rate: 74 (08/01 1030)  Labs:  Recent Labs  08/07/15 0730 08/08/15 0529 08/08/15 1000  HGB 10.7* 9.1* 9.4*  HCT 32.9* 26.6* 27.4*  PLT 260 139* 160  LABPROT 21.0* 18.4*  --   INR 1.79 1.51  --   CREATININE 9.01* 6.87*  --   TROPONINI 0.03* 0.05*  --     Estimated Creatinine Clearance: 8.7 mL/min (by C-G formula based on SCr of 6.87 mg/dL).   Medical History: Past Medical History:  Diagnosis Date  . Anemia   . Anxiety   . CHF (congestive heart failure) ([place])   . Chronic kidney disease   . Depression   . Dialysis patient ([place])   . DVT (deep venous thrombosis) ([facility])    left leg  . DVT (deep venous thrombosis) ([place])   . Gout   . HTN (hypertension)   . Hypertension   . Parathyroid abnormality ([place])   . Parathyroid disease ([place])   . Psoriasis     Medications:  Prescriptions Prior to Admission  Medication Sig Dispense Refill Last Dose  . cinacalcet (SENSIPAR) 30 MG tablet Take 120 mg by mouth.   unknown at unknown  . clonazePAM (KLONOPIN) 0.5 MG tablet Take 1/2 tablet before dialysis, can take a whole tablet if needed   unknown at unknown  . losartan (COZAAR) 50 MG tablet Take 100 mg by mouth daily.    unknown at unknown  . metoprolol (LOPRESSOR) 100 MG tablet Take 50 mg by mouth 2 (two) times daily.   unknown at unknown  . pantoprazole (PROTONIX) 20 MG tablet Take 20 mg by mouth daily.    unknown at unknown  . sertraline (ZOLOFT) 100 MG tablet Take 100 mg by mouth at bedtime.    unknown at unknown  . sevelamer carbonate (RENVELA) 800 MG tablet Take 2,400 mg by mouth.   unknown at unknown  . warfarin (COUMADIN) 5 MG tablet  Take 1 tablet Monday and Friday. Take 1 1/2 tablets the other days of the week   unknown at unknown  . zolpidem (AMBIEN) 10 MG tablet Take 10 mg by mouth at bedtime as needed for sleep.   unknown at unknown  . traMADol (ULTRAM) 50 MG tablet Take 50 mg by mouth every 6 (six) hours as needed.    unknown at unknown   Scheduled:  . antiseptic oral rinse  7 mL Mouth Rinse BID  . cinacalcet  120 mg Oral Q supper  . losartan  100 mg Oral Daily  . metoprolol  50 mg Oral BID  . pantoprazole  40 mg Oral Daily  . sertraline  100 mg Oral QHS  . sevelamer carbonate  800 mg Oral TID WC  . warfarin  7.5 mg Oral Once per day on Sun Tue Wed Thu Sat   And  . warfarin  5 mg Oral Once per day on Mon Fri  . Warfarin - Pharmacist Dosing Inpatient   Does not apply q1800    Assessment: 60 y/o F with a h/o DVT on Coumadin 7.5 mg daily except 5 mg M/F admitted with pulmonary edema. INR is subtherapeutic on admission. Not sure when patient had last dose.  7/31 INR: 1.79  Coumadin 5 mg 8/1   INR: 1.51    Goal of Therapy:  INR 2-3   Plan:  INR is subtherapeutic. Will continue outpatient dosing of Coumadin for now. If INR contiues to decrease tomorrow, will have to increase dose.    Ulice Dash D 08/08/2015,11:14 AM

## 2015-08-08 NOTE — Progress Notes (Signed)
[facility]  ROUNDING NOTE   Subjective:   Hemodialysis yesterday emergently. UF of 2 litres.  Scheduled for dialysis again today due to bilateral crackles on examination  Objective:  Vital signs in last 24 hours:  Temp:  [98.2 F (36.8 C)-99.2 F (37.3 C)] 98.8 F (37.1 C) (08/01 1005) Pulse Rate:  [47-100] 74 (08/01 1030) Resp:  [14-29] 22 (08/01 1030) BP: (107-184)/(63-161) 134/84 (08/01 1030) SpO2:  [92 %-100 %] 93 % (08/01 1030) FiO2 (%):  [100 %] 100 % (07/31 1200) Weight:  [69 kg (152 lb 1.9 oz)-71.6 kg (157 lb 13.6 oz)] 71.6 kg (157 lb 13.6 oz) (08/01 1005)  Weight change:  Filed Weights   08/07/15 1020 08/07/15 1345 08/08/15 1005  Weight: 73 kg (160 lb 15 oz) 69 kg (152 lb 1.9 oz) 71.6 kg (157 lb 13.6 oz)    Intake/Output: I/O last 3 completed shifts: In: -  Out: 2000 [Other:2000]   Intake/Output this shift:  No intake/output data recorded.  Physical Exam: General: Laying in bed  Head: Normocephalic, atraumatic. Moist oral mucosal membranes  Eyes: Anicteric, PERRL  Neck: Supple, trachea midline  Lungs:  Bilateral crackles  Heart: Regular rate and rhythm  Abdomen:  Soft, nontender  Extremities: trace peripheral edema.  Neurologic: Nonfocal, moving all four extremities  Skin: No lesions  Access: RIJ permcath    Basic Metabolic Panel:  Recent Labs Lab 08/07/15 0730 08/08/15 0529  NA 141 138  K 4.7 4.2  CL 107 100*  CO2 20* 27  GLUCOSE 173* 97  BUN 53* 38*  CREATININE 9.01* 6.87*  CALCIUM 7.7* 7.2*    Liver Function Tests:  Recent Labs Lab 08/07/15 0730  AST 19  ALT 14  ALKPHOS 1,043*  BILITOT 0.6  PROT 7.2  ALBUMIN 3.6   No results for input(s): LIPASE, AMYLASE in the last 168 hours. No results for input(s): AMMONIA in the last 168 hours.  CBC:  Recent Labs Lab 08/07/15 0730 08/08/15 0529  WBC 18.0* 8.6  HGB 10.7* 9.1*  HCT 32.9* 26.6*  MCV 88.6 85.2  PLT 260 139*    Cardiac Enzymes:  Recent Labs Lab  08/07/15 0730 08/08/15 0529  TROPONINI 0.03* 0.05*    BNP: Invalid input(s): POCBNP  CBG:  Recent Labs Lab 08/07/15 0846  GLUCAP 29    Microbiology: Results for orders placed or performed during the hospital encounter of 07/09/15  MRSA PCR Screening     Status: None   Collection Time: 07/09/15  4:21 PM  Result Value Ref Range Status   MRSA by PCR NEGATIVE NEGATIVE Final    Comment:        The GeneXpert MRSA Assay (FDA approved for NASAL specimens only), is one component of a comprehensive MRSA colonization surveillance program. It is not intended to diagnose MRSA infection nor to guide or monitor treatment for MRSA infections.     Coagulation Studies:  Recent Labs  08/07/15 0730 08/08/15 0529  LABPROT 21.0* 18.4*  INR 1.79 1.51    Urinalysis: No results for input(s): COLORURINE, LABSPEC, PHURINE, GLUCOSEU, HGBUR, BILIRUBINUR, KETONESUR, PROTEINUR, UROBILINOGEN, NITRITE, LEUKOCYTESUR in the last 72 hours.  Invalid input(s): APPERANCEUR    Imaging: Dg Chest Port 1 View  Result Date: 08/08/2015 CLINICAL DATA:  Respiratory failure EXAM: PORTABLE CHEST 1 VIEW COMPARISON:  August 07, 2015 FINDINGS: There has been interval clearing of pulmonary edema. Currently no edema or consolidation is evident. Heart is mildly enlarged with pulmonary vascularity within normal limits. There is atherosclerotic calcification  in the aorta. Central catheter tip is in the superior vena cava. No pneumothorax. No bone lesions. No adenopathy. IMPRESSION: Currently no edema or consolidation. Edema has cleared compared to 1 day prior. Stable cardiac prominence. There is aortic atherosclerosis. No pneumothorax. Electronically Signed   By: Lowella Grip III M.D.   On: 08/08/2015 07:30   Dg Chest Port 1 View  Result Date: 08/07/2015 CLINICAL DATA:  Respiratory distress.  Chronic renal failure EXAM: PORTABLE CHEST 1 VIEW COMPARISON:  July 11, 2015. FINDINGS: There is generalized interstitial  pulmonary edema. There is probable mild alveolar edema in the right base. There is cardiomegaly with mild pulmonary venous hypertension. There is atherosclerotic calcification in the aorta. Central catheter tip is in the superior vena cava near the cavoatrial junction. No pneumothorax. No evident bone lesions. IMPRESSION: Evidence of a degree of congestive heart failure. Suspect alveolar edema in the right base, although early pneumonia in this area is possible. There is aortic atherosclerosis. No pneumothorax. Electronically Signed   By: Lowella Grip III M.D.   On: 08/07/2015 07:50    Medications:     . antiseptic oral rinse  7 mL Mouth Rinse BID  . cinacalcet  120 mg Oral Q supper  . losartan  100 mg Oral Daily  . metoprolol  50 mg Oral BID  . pantoprazole  40 mg Oral Daily  . sertraline  100 mg Oral QHS  . sevelamer carbonate  800 mg Oral TID WC  . warfarin  7.5 mg Oral Once per day on Sun Tue Wed Thu Sat   And  . warfarin  5 mg Oral Once per day on Mon Fri  . Warfarin - Pharmacist Dosing Inpatient   Does not apply q1800   hydrALAZINE, nitroGLYCERIN, traMADol  Assessment/ Plan:  [name] is a 60 y.o. black female with end-stage renal disease, hypertension, cardiomyopathy, chronic systolic/diastolic congestive heart failure admitted on 08/07/2015 for Respiratory failure ([place]) [J96.90] Acute pulmonary edema ([facility]) [J81.0] Respiratory distress [R06.00] Acute respiratory failure with hypoxia ([place]) [J96.01]   MWF [facility]  1. End Stage Renal Disease: with flash pulmonary edema and acute respiratory failure: emergent dialysis yesterday. UF of 2 litres.  - Will plan on additional dialysis today, UF goal of 3 litres. Will monitor daily for dialysis need.   2. Anemia of chronic kidney disease: no indication for epo with hypertension and pulmonary edema.   3. Hypertension: at goal this morning.  - home regimen of losartan and metoprolol.    4. Secondary Hyperparathyroidism:  - cinacalcet - sevelamer with meals.    LOS: Teresa Cook, Teresa Cook 8/1/201710:41 AM

## 2015-08-08 NOTE — Progress Notes (Signed)
POST HD ASSESSMENT

## 2015-08-08 NOTE — Progress Notes (Signed)
End of HD tx

## 2015-08-08 NOTE — Progress Notes (Signed)
Post hd vitals 

## 2015-08-08 NOTE — Progress Notes (Signed)
Lab called with a critical troponin of .05. Dr. Leonidas Romberg notified.

## 2015-08-09 ENCOUNTER — Ambulatory Visit (INDEPENDENT_AMBULATORY_CARE_PROVIDER_SITE_OTHER): Payer: Medicare Other | Admitting: Surgery

## 2015-08-09 ENCOUNTER — Encounter: Payer: Self-pay | Admitting: Surgery

## 2015-08-09 VITALS — BP 160/95 | HR 80 | Temp 98.9°F | Ht 66.0 in | Wt 147.0 lb

## 2015-08-09 DIAGNOSIS — C50212 Malignant neoplasm of upper-inner quadrant of left female breast: Secondary | ICD-10-CM | POA: Diagnosis not present

## 2015-08-09 LAB — HEPATITIS B SURFACE ANTIGEN: Hepatitis B Surface Ag: NEGATIVE

## 2015-08-10 ENCOUNTER — Telehealth: Payer: Self-pay

## 2015-08-10 ENCOUNTER — Telehealth: Payer: Self-pay | Admitting: Surgery

## 2015-08-10 ENCOUNTER — Telehealth: Payer: Self-pay | Admitting: *Deleted

## 2015-08-10 NOTE — Patient Instructions (Signed)
Docetaxel injection  What is this medicine?  DOCETAXEL (doe se TAX el) is a chemotherapy drug. It targets fast dividing cells, like cancer cells, and causes these cells to die. This medicine is used to treat many types of cancers like breast cancer, certain stomach cancers, head and neck cancer, lung cancer, and prostate cancer.  This medicine may be used for other purposes; ask your health care provider or pharmacist if you have questions.  What should I tell my health care provider before I take this medicine?  They need to know if you have any of these conditions:  -infection (especially a virus infection such as chickenpox, cold sores, or herpes)  -liver disease  -low blood counts, like low white cell, platelet, or red cell counts  -an unusual or allergic reaction to docetaxel, polysorbate 80, other chemotherapy agents, other medicines, foods, dyes, or preservatives  -pregnant or trying to get pregnant  -breast-feeding  How should I use this medicine?  This drug is given as an infusion into a vein. It is administered in a hospital or clinic by a specially trained health care professional.  Talk to your pediatrician regarding the use of this medicine in children. Special care may be needed.  Overdosage: If you think you have taken too much of this medicine contact a poison control center or emergency room at once.  NOTE: This medicine is only for you. Do not share this medicine with others.  What if I miss a dose?  It is important not to miss your dose. Call your doctor or health care professional if you are unable to keep an appointment.  What may interact with this medicine?  -cyclosporine  -erythromycin  -ketoconazole  -medicines to increase blood counts like filgrastim, pegfilgrastim, sargramostim  -vaccines  Talk to your doctor or health care professional before taking any of these medicines:  -acetaminophen  -aspirin  -ibuprofen  -ketoprofen  -naproxen  This list may not describe all possible interactions.  Give your health care provider a list of all the medicines, herbs, non-prescription drugs, or dietary supplements you use. Also tell them if you smoke, drink alcohol, or use illegal drugs. Some items may interact with your medicine.  What should I watch for while using this medicine?  Your condition will be monitored carefully while you are receiving this medicine. You will need important blood work done while you are taking this medicine.  This drug may make you feel generally unwell. This is not uncommon, as chemotherapy can affect healthy cells as well as cancer cells. Report any side effects. Continue your course of treatment even though you feel ill unless your doctor tells you to stop.  In some cases, you may be given additional medicines to help with side effects. Follow all directions for their use.  Call your doctor or health care professional for advice if you get a fever, chills or sore throat, or other symptoms of a cold or flu. Do not treat yourself. This drug decreases your body's ability to fight infections. Try to avoid being around people who are sick.  This medicine may increase your risk to bruise or bleed. Call your doctor or health care professional if you notice any unusual bleeding.  This medicine may contain alcohol in the product. You may get drowsy or dizzy. Do not drive, use machinery, or do anything that needs mental alertness until you know how this medicine affects you. Do not stand or sit up quickly, especially if you are an older   patient. This reduces the risk of dizzy or fainting spells. Avoid alcoholic drinks.  Do not become pregnant while taking this medicine. Women should inform their doctor if they wish to become pregnant or think they might be pregnant. There is a potential for serious side effects to an unborn child. Talk to your health care professional or pharmacist for more information. Do not breast-feed an infant while taking this medicine.  What side effects may I notice  from receiving this medicine?  Side effects that you should report to your doctor or health care professional as soon as possible:  -allergic reactions like skin rash, itching or hives, swelling of the face, lips, or tongue  -low blood counts - This drug may decrease the number of white blood cells, red blood cells and platelets. You may be at increased risk for infections and bleeding.  -signs of infection - fever or chills, cough, sore throat, pain or difficulty passing urine  -signs of decreased platelets or bleeding - bruising, pinpoint red spots on the skin, black, tarry stools, nosebleeds  -signs of decreased red blood cells - unusually weak or tired, fainting spells, lightheadedness  -breathing problems  -fast or irregular heartbeat  -low blood pressure  -mouth sores  -nausea and vomiting  -pain, swelling, redness or irritation at the injection site  -pain, tingling, numbness in the hands or feet  -swelling of the ankle, feet, hands  -weight gain  Side effects that usually do not require medical attention (report to your prescriber or health care professional if they continue or are bothersome):  -bone pain  -complete hair loss including hair on your head, underarms, pubic hair, eyebrows, and eyelashes  -diarrhea  -excessive tearing  -changes in the color of fingernails  -loosening of the fingernails  -nausea  -muscle pain  -red flush to skin  -sweating  -weak or tired  This list may not describe all possible side effects. Call your doctor for medical advice about side effects. You may report side effects to FDA at 1-800-FDA-1088.  Where should I keep my medicine?  This drug is given in a hospital or clinic and will not be stored at home.  NOTE: This sheet is a summary. It may not cover all possible information. If you have questions about this medicine, talk to your doctor, pharmacist, or health care provider.      2016, [employer]. (2014-01-10 16:04:57)    Cyclophosphamide injection  What is  this medicine?  CYCLOPHOSPHAMIDE (sye kloe FOSS fa mide) is a chemotherapy drug. It slows the growth of cancer cells. This medicine is used to treat many types of cancer like lymphoma, myeloma, leukemia, breast cancer, and ovarian cancer, to name a few.  This medicine may be used for other purposes; ask your health care provider or pharmacist if you have questions.  What should I tell my health care provider before I take this medicine?  They need to know if you have any of these conditions:  -blood disorders  -history of other chemotherapy  -infection  -kidney disease  -liver disease  -recent or ongoing radiation therapy  -tumors in the bone marrow  -an unusual or allergic reaction to cyclophosphamide, other chemotherapy, other medicines, foods, dyes, or preservatives  -pregnant or trying to get pregnant  -breast-feeding  How should I use this medicine?  This drug is usually given as an injection into a vein or muscle or by infusion into a vein. It is administered in a hospital or clinic by a   specially trained health care professional.  Talk to your pediatrician regarding the use of this medicine in children. Special care may be needed.  Overdosage: If you think you have taken too much of this medicine contact a poison control center or emergency room at once.  NOTE: This medicine is only for you. Do not share this medicine with others.  What if I miss a dose?  It is important not to miss your dose. Call your doctor or health care professional if you are unable to keep an appointment.  What may interact with this medicine?  This medicine may interact with the following medications:  -amiodarone  -amphotericin B  -azathioprine  -certain antiviral medicines for HIV or AIDS such as protease inhibitors (e.g., indinavir, ritonavir) and zidovudine  -certain blood pressure medications such as benazepril, captopril, enalapril, fosinopril, lisinopril, moexipril, monopril, perindopril, quinapril, ramipril,  trandolapril  -certain cancer medications such as anthracyclines (e.g., daunorubicin, doxorubicin), busulfan, cytarabine, paclitaxel, pentostatin, tamoxifen, trastuzumab  -certain diuretics such as chlorothiazide, chlorthalidone, hydrochlorothiazide, indapamide, metolazone  -certain medicines that treat or prevent blood clots like warfarin  -certain muscle relaxants such as succinylcholine  -cyclosporine  -etanercept  -indomethacin  -medicines to increase blood counts like filgrastim, pegfilgrastim, sargramostim  -medicines used as general anesthesia  -metronidazole  -natalizumab  This list may not describe all possible interactions. Give your health care provider a list of all the medicines, herbs, non-prescription drugs, or dietary supplements you use. Also tell them if you smoke, drink alcohol, or use illegal drugs. Some items may interact with your medicine.  What should I watch for while using this medicine?  Visit your doctor for checks on your progress. This drug may make you feel generally unwell. This is not uncommon, as chemotherapy can affect healthy cells as well as cancer cells. Report any side effects. Continue your course of treatment even though you feel ill unless your doctor tells you to stop.  Drink water or other fluids as directed. Urinate often, even at night.  In some cases, you may be given additional medicines to help with side effects. Follow all directions for their use.  Call your doctor or health care professional for advice if you get a fever, chills or sore throat, or other symptoms of a cold or flu. Do not treat yourself. This drug decreases your body's ability to fight infections. Try to avoid being around people who are sick.  This medicine may increase your risk to bruise or bleed. Call your doctor or health care professional if you notice any unusual bleeding.  Be careful brushing and flossing your teeth or using a toothpick because you may get an infection or bleed more easily.  If you have any dental work done, tell your dentist you are receiving this medicine.  You may get drowsy or dizzy. Do not drive, use machinery, or do anything that needs mental alertness until you know how this medicine affects you.  Do not become pregnant while taking this medicine or for 1 year after stopping it. Women should inform their doctor if they wish to become pregnant or think they might be pregnant. Men should not father a child while taking this medicine and for 4 months after stopping it. There is a potential for serious side effects to an unborn child. Talk to your health care professional or pharmacist for more information. Do not breast-feed an infant while taking this medicine.  This medicine may interfere with the ability to have a child. This medicine has   caused ovarian failure in some women. This medicine has caused reduced sperm counts in some men. You should talk with your doctor or health care professional if you are concerned about your fertility.  If you are going to have surgery, tell your doctor or health care professional that you have taken this medicine.  What side effects may I notice from receiving this medicine?  Side effects that you should report to your doctor or health care professional as soon as possible:  -allergic reactions like skin rash, itching or hives, swelling of the face, lips, or tongue  -low blood counts - this medicine may decrease the number of white blood cells, red blood cells and platelets. You may be at increased risk for infections and bleeding.  -signs of infection - fever or chills, cough, sore throat, pain or difficulty passing urine  -signs of decreased platelets or bleeding - bruising, pinpoint red spots on the skin, black, tarry stools, blood in the urine  -signs of decreased red blood cells - unusually weak or tired, fainting spells, lightheadedness  -breathing problems  -dark urine  -dizziness  -palpitations  -swelling of the ankles, feet,  hands  -trouble passing urine or change in the amount of urine  -weight gain  -yellowing of the eyes or skin  Side effects that usually do not require medical attention (report to your doctor or health care professional if they continue or are bothersome):  -changes in nail or skin color  -hair loss  -missed menstrual periods  -mouth sores  -nausea, vomiting  This list may not describe all possible side effects. Call your doctor for medical advice about side effects. You may report side effects to FDA at 1-800-FDA-1088.  Where should I keep my medicine?  This drug is given in a hospital or clinic and will not be stored at home.  NOTE: This sheet is a summary. It may not cover all possible information. If you have questions about this medicine, talk to your doctor, pharmacist, or health care provider.      2016, [employer]. (2011-11-08 16:22:58)    Pegfilgrastim injection  What is this medicine?  PEGFILGRASTIM (PEG fil gra stim) is a long-acting granulocyte colony-stimulating factor that stimulates the growth of neutrophils, a type of white blood cell important in the body's fight against infection. It is used to reduce the incidence of fever and infection in patients with certain types of cancer who are receiving chemotherapy that affects the bone marrow, and to increase survival after being exposed to high doses of radiation.  This medicine may be used for other purposes; ask your health care provider or pharmacist if you have questions.  What should I tell my health care provider before I take this medicine?  They need to know if you have any of these conditions:  -kidney disease  -latex allergy  -ongoing radiation therapy  -sickle cell disease  -skin reactions to acrylic adhesives (On-Body Injector only)  -an unusual or allergic reaction to pegfilgrastim, filgrastim, other medicines, foods, dyes, or preservatives  -pregnant or trying to get pregnant  -breast-feeding  How should I use this  medicine?  This medicine is for injection under the skin. If you get this medicine at home, you will be taught how to prepare and give the pre-filled syringe or how to use the On-body Injector. Refer to the patient Instructions for Use for detailed instructions. Use exactly as directed. Take your medicine at regular intervals. Do not take your medicine more often   than directed.  It is important that you put your used needles and syringes in a special sharps container. Do not put them in a trash can. If you do not have a sharps container, call your pharmacist or healthcare provider to get one.  Talk to your pediatrician regarding the use of this medicine in children. While this drug may be prescribed for selected conditions, precautions do apply.  Overdosage: If you think you have taken too much of this medicine contact a poison control center or emergency room at once.  NOTE: This medicine is only for you. Do not share this medicine with others.  What if I miss a dose?  It is important not to miss your dose. Call your doctor or health care professional if you miss your dose. If you miss a dose due to an On-body Injector failure or leakage, a new dose should be administered as soon as possible using a single prefilled syringe for manual use.  What may interact with this medicine?  Interactions have not been studied.  Give your health care provider a list of all the medicines, herbs, non-prescription drugs, or dietary supplements you use. Also tell them if you smoke, drink alcohol, or use illegal drugs. Some items may interact with your medicine.  This list may not describe all possible interactions. Give your health care provider a list of all the medicines, herbs, non-prescription drugs, or dietary supplements you use. Also tell them if you smoke, drink alcohol, or use illegal drugs. Some items may interact with your medicine.  What should I watch for while using this medicine?  You may need blood work done while  you are taking this medicine.  If you are going to need a MRI, CT scan, or other procedure, tell your doctor that you are using this medicine (On-Body Injector only).  What side effects may I notice from receiving this medicine?  Side effects that you should report to your doctor or health care professional as soon as possible:  -allergic reactions like skin rash, itching or hives, swelling of the face, lips, or tongue  -dizziness  -fever  -pain, redness, or irritation at site where injected  -pinpoint red spots on the skin  -red or dark-brown urine  -shortness of breath or breathing problems  -stomach or side pain, or pain at the shoulder  -swelling  -tiredness  -trouble passing urine or change in the amount of urine  Side effects that usually do not require medical attention (report to your doctor or health care professional if they continue or are bothersome):  -bone pain  -muscle pain  This list may not describe all possible side effects. Call your doctor for medical advice about side effects. You may report side effects to FDA at 1-800-FDA-1088.  Where should I keep my medicine?  Keep out of the reach of children.  Store pre-filled syringes in a refrigerator between 2 and 8 degrees C (36 and 46 degrees F). Do not freeze. Keep in carton to protect from light. Throw away this medicine if it is left out of the refrigerator for more than 48 hours. Throw away any unused medicine after the expiration date.  NOTE: This sheet is a summary. It may not cover all possible information. If you have questions about this medicine, talk to your doctor, pharmacist, or health care provider.      2016, [employer]. (2014-01-13 14:30:14)

## 2015-08-10 NOTE — Telephone Encounter (Signed)
Called Heather and Tillie Rung (Dr. Richardson Dopp nurses) from the [facility] and they were not available to answer since they are seeing patient's. I then called and spoke to [facility] (Triage nurse) and told her that I needed a message to be sent to Dr. Gary Fleet nurses. I told Hassan Rowan that patient will have her port placed on 08/21/2015 by Dr. Azalee Course. Hassan Rowan stated that she would relate the message to them.

## 2015-08-10 NOTE — Telephone Encounter (Signed)
Called to report that patient is having her port placed on 08/21/15 and that she needs a follow up appt with Dr Grayland Ormond

## 2015-08-10 NOTE — Telephone Encounter (Signed)
Pt advised of pre op date/time and sx date. Sx: 08/21/15 with Dr Carole Binning port placement.  Pre op: 08/14/15 @ 1:00pm office.   Patient made aware to call [phone], between 1-3:00pm the Friday before surgery, to find out what time to arrive.

## 2015-08-11 ENCOUNTER — Other Ambulatory Visit: Payer: Self-pay | Admitting: Oncology

## 2015-08-11 NOTE — Progress Notes (Signed)
Subjective:     Patient ID: Teresa Cook, female   DOB: 1955/11/08, 60 y.o.   MRN: [id number]  HPI  60 year old female who has end-stage renal disease on dialysis has been found to have a palpable left breast invasive mammary carcinoma as well as in her lymph nodes. The patient is here to discuss her surgical options. The patient first started menses was she was age 35 she did have 3 children the first at the age of 66. She did not breast-feed. Patient was on birth control for about 15 years. Patient has not taken any hormone replacement therapy. The patient stated that she had been keeping up with her normal mammograms and that they have biopsied this area. Patient states the last time she had a mammogram was 6 months prior to this one. The patient states that she did notice the mass and then new that her next mammogram was coming up. She denies any skin changes or nipple discharge or any pain in the breast. The patient does have a family history of breast cancer in her aunt and in her sister.   Past Medical History:  Diagnosis Date  . Anemia   . Anxiety   . CHF (congestive heart failure) ([place])   . Chronic kidney disease   . Depression   . Dialysis patient ([place])   . DVT (deep venous thrombosis) ([facility])    left leg  . DVT (deep venous thrombosis) ([place])   . Gout   . HTN (hypertension)   . Hypertension   . Parathyroid abnormality ([place])   . Parathyroid disease ([place])   . Psoriasis    Past Surgical History:  Procedure Laterality Date  . ABDOMINAL HYSTERECTOMY    . APPENDECTOMY    . BREAST BIOPSY Left 10/28/2013   benign  . BREAST EXCISIONAL BIOPSY Left 2002   benign  . PARTIAL HYSTERECTOMY     Family History  Problem Relation Age of Onset  . Stroke Mother   . CVA Mother   . Hypertension Mother   . Hypertension Father   . Hypertension Sister   . Diabetes Sister   . Cancer Sister 36    Breast  . Stroke Brother   . Hypertension Brother   . Breast cancer Maternal Aunt 70    Social History   Social History  . Marital status: Married    Spouse name: N/A  . Number of children: N/A  . Years of education: N/A   Social History Main Topics  . Smoking status: Never Smoker  . Smokeless tobacco: Never Used  . Alcohol use No  . Drug use: No  . Sexual activity: Not Asked   Other Topics Concern  . None   Social History Narrative   ** Merged History Encounter **        Current Outpatient Prescriptions:  .  cinacalcet (SENSIPAR) 30 MG tablet, Take 120 mg by mouth., Disp: , Rfl:  .  clonazePAM (KLONOPIN) 0.5 MG tablet, Take 1/2 tablet before dialysis, can take a whole tablet if needed, Disp: , Rfl:  .  losartan (COZAAR) 50 MG tablet, Take 100 mg by mouth daily. , Disp: , Rfl:  .  metoprolol (LOPRESSOR) 100 MG tablet, Take 50 mg by mouth 2 (two) times daily., Disp: , Rfl:  .  pantoprazole (PROTONIX) 20 MG tablet, Take 20 mg by mouth daily. , Disp: , Rfl:  .  sertraline (ZOLOFT) 100 MG tablet, Take 100 mg by mouth at bedtime. , Disp: , Rfl:  .  sevelamer carbonate (RENVELA) 800 MG tablet, Take 2,400 mg by mouth., Disp: , Rfl:  .  traMADol (ULTRAM) 50 MG tablet, Take 50 mg by mouth every 6 (six) hours as needed. , Disp: , Rfl:  .  warfarin (COUMADIN) 5 MG tablet, Take 1 tablet Monday and Friday. Take 1 1/2 tablets the other days of the week, Disp: , Rfl:  .  zolpidem (AMBIEN) 10 MG tablet, Take 10 mg by mouth at bedtime as needed for sleep., Disp: , Rfl:  No Known Allergies   Review of Systems  Constitutional: Negative for activity change, appetite change, fatigue, fever and unexpected weight change.  HENT: Negative for congestion and sore throat.   Respiratory: Positive for cough and shortness of breath. Negative for wheezing.   Cardiovascular: Negative for chest pain, palpitations and leg swelling.  Gastrointestinal: Negative for abdominal distention, constipation and diarrhea.  Genitourinary: Negative for dysuria and urgency.  Musculoskeletal: Negative  for back pain and myalgias.  Skin: Negative for color change, pallor, rash and wound.  Neurological: Negative for dizziness and weakness.  Hematological: Positive for adenopathy. Does not bruise/bleed easily.  Psychiatric/Behavioral: Negative for agitation. The patient is not nervous/anxious.   All other systems reviewed and are negative.  Vitals:   08/09/15 1441  BP: (!) 160/95  Pulse: 80  Temp: 98.9 F (37.2 C)       Objective:   Physical Exam  Constitutional: She is oriented to person, place, and time. She appears well-developed and well-nourished. No distress.  HENT:  Head: Normocephalic and atraumatic.  Right Ear: External ear normal.  Left Ear: External ear normal.  Nose: Nose normal.  Mouth/Throat: Oropharynx is clear and moist. No oropharyngeal exudate.  Eyes: Conjunctivae and EOM are normal. Pupils are equal, round, and reactive to light. No scleral icterus.  Neck: Normal range of motion. Neck supple. No tracheal deviation present.  Cardiovascular: Normal rate, regular rhythm, normal heart sounds and intact distal pulses.  Exam reveals no gallop and no friction rub.   No murmur heard. Pulmonary/Chest: Effort normal and breath sounds normal. No respiratory distress. She has no wheezes. She has no rales.  Right chest dialysis catheter  Abdominal: Soft. Bowel sounds are normal. She exhibits no distension. There is no tenderness. There is no rebound.  Musculoskeletal: Normal range of motion. She exhibits no edema, tenderness or deformity.  Lymphadenopathy:    She has no cervical adenopathy.  Neurological: She is alert and oriented to person, place, and time. No cranial nerve deficit.  Skin: Skin is warm and dry. No rash noted. No erythema. No pallor.  Psychiatric: She has a normal mood and affect. Her behavior is normal. Judgment and thought content normal.  Vitals reviewed.  Breast exam: Right breast: No palpable masses and normal skin no nipple discharge or retraction,  no lymphadenopathy in the axilla or supraclavicular region  Left breast: No skin abnormalities nipple retraction or nipple discharge, 3 cm mass in the 3:00 position approximately 2 cm away from the nipple area or complex, approximately 3 cm posterior to this another 3 cm oblong mass is hard and fixed, in the axilla another 3 cm mass is hard and fixed.    CBC Latest Ref Rng & Units 08/08/2015 08/08/2015 08/07/2015  WBC 3.6 - 11.0 K/uL 8.6 8.6 18.0(H)  Hemoglobin 12.0 - 16.0 g/dL 9.4(L) 9.1(L) 10.7(L)  Hematocrit 35.0 - 47.0 % 27.4(L) 26.6(L) 32.9(L)  Platelets 150 - 440 K/uL 160 139(L) 260   CMP Latest Ref Rng & Units 08/08/2015  08/08/2015 08/07/2015  Glucose 65 - 99 mg/dL 104(H) 97 173(H)  BUN 6 - 20 mg/dL 42(H) 38(H) 53(H)  Creatinine 0.44 - 1.00 mg/dL 7.36(H) 6.87(H) 9.01(H)  Sodium 135 - 145 mmol/L 138 138 141  Potassium 3.5 - 5.1 mmol/L 4.3 4.2 4.7  Chloride 101 - 111 mmol/L 100(L) 100(L) 107  CO2 22 - 32 mmol/L 28 27 20(L)  Calcium 8.9 - 10.3 mg/dL 7.6(L) 7.2(L) 7.7(L)  Total Protein 6.5 - 8.1 g/dL - - 7.2  Total Bilirubin 0.3 - 1.2 mg/dL - - 0.6  Alkaline Phos 38 - 126 U/L - - 1,043(H)  AST 15 - 41 U/L - - 19  ALT 14 - 54 U/L - - 14   Biopsy: Er+ PR+ Her2 - 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 Personally reviewed the patient's laboratory values which are relatively normal at this time. I have also reviewed her imaging to include her mammograms and her ultrasounds which do show these large masses in the 3:00 position of her left breast. I also reviewed the mammograms that we have on file and her last mammogram prior to this was more than a year before.   I had a very extensive discussion with this patient including the risks benefits options  and alternatives. I discussed with the patient that at this time the only surgical option for her currently would be a mastectomy with an axillary node dissection given the positivity of the nodes in the axilla. I did state that if we did try chemotherapy that we may be able to do breast conservative therapy but may still need to fully take all the lymph nodes out of the left axilla. I discussed with the patient that there are multiple options that are available and we will have to see how she responds to the chemotherapy to determine if a lumpectomy be appropriate for her in the future. The patient would like to attempt to do chemotherapy and then try this. Patient is very much not interested in having radiation however and I did recommend that she meet with Dr. Donella Stade and learn a little bit more about the way we do radiation therapy here in the meantime. If the patient does not want radiation therapy then a mastectomy will be her only surgical option.  The patient is agreeable to start chemotherapy at this time and also direction she would like to go. I did discuss that the patient will likely need a port placed for this as usually is not given to dialysis catheters. I discussed the risk benefits alternatives with the patient to include a risk of bleeding, infection, need for removal of the port, kinking or clotting off of the port, small risk of pneumothorax and need for a chest tube placement. The patient was given opportunity to ask questions and have them answered. We will schedule the port placement to be done in the next couple weeks.  I spent 75 minutes with the patient with over 50% of that time spent on education and counseling.

## 2015-08-14 ENCOUNTER — Encounter
Admission: RE | Admit: 2015-08-14 | Discharge: 2015-08-14 | Disposition: A | Discharge status: Home / Self Care | Payer: Medicare Other | Source: Ambulatory Visit | Attending: Surgery | Admitting: Surgery

## 2015-08-14 ENCOUNTER — Inpatient Hospital Stay: Admission: RE | Admit: 2015-08-14 | Payer: Medicare Other | Source: Ambulatory Visit

## 2015-08-14 DIAGNOSIS — Z01812 Encounter for preprocedural laboratory examination: Secondary | ICD-10-CM | POA: Insufficient documentation

## 2015-08-14 DIAGNOSIS — C50212 Malignant neoplasm of upper-inner quadrant of left female breast: Secondary | ICD-10-CM | POA: Insufficient documentation

## 2015-08-14 LAB — CBC WITH DIFFERENTIAL/PLATELET
BASOS ABS: 0 10*3/uL (ref 0–0.1)
BASOS PCT: 1 %
Eosinophils Absolute: 0.1 10*3/uL (ref 0–0.7)
Eosinophils Relative: 2 %
HEMATOCRIT: 27.6 % — AB (ref 35.0–47.0)
HEMOGLOBIN: 9.4 g/dL — AB (ref 12.0–16.0)
Lymphocytes Relative: 26 %
Lymphs Abs: 1.7 10*3/uL (ref 1.0–3.6)
MCH: 29.9 pg (ref 26.0–34.0)
MCHC: 34.1 g/dL (ref 32.0–36.0)
MCV: 87.6 fL (ref 80.0–100.0)
Monocytes Absolute: 0.5 10*3/uL (ref 0.2–0.9)
Monocytes Relative: 8 %
NEUTROS ABS: 4.2 10*3/uL (ref 1.4–6.5)
NEUTROS PCT: 63 %
Platelets: 162 10*3/uL (ref 150–440)
RBC: 3.15 MIL/uL — ABNORMAL LOW (ref 3.80–5.20)
RDW: 15.5 % — AB (ref 11.5–14.5)
WBC: 6.5 10*3/uL (ref 3.6–11.0)

## 2015-08-14 LAB — BASIC METABOLIC PANEL
ANION GAP: 7 (ref 5–15)
BUN: 16 mg/dL (ref 6–20)
CALCIUM: 8 mg/dL — AB (ref 8.9–10.3)
CO2: 27 mmol/L (ref 22–32)
Chloride: 106 mmol/L (ref 101–111)
Creatinine, Ser: 4.31 mg/dL — ABNORMAL HIGH (ref 0.44–1.00)
GFR, EST AFRICAN AMERICAN: 12 mL/min — AB (ref >60)
GFR, EST NON AFRICAN AMERICAN: 10 mL/min — AB (ref >60)
Glucose, Bld: 100 mg/dL — ABNORMAL HIGH (ref 65–99)
Potassium: 3.6 mmol/L (ref 3.5–5.1)
Sodium: 140 mmol/L (ref 135–145)

## 2015-08-14 LAB — PROTIME-INR
INR: 2.1
Prothrombin Time: 23.9 seconds — ABNORMAL HIGH (ref 11.4–15.2)

## 2015-08-14 NOTE — Patient Instructions (Signed)
  Your procedure is scheduled [id number] August 14 , 2017. Report to Same Day Surgery. To find out your arrival time please call [phone] between 1PM - 3PM on Friday August 18, 2015 .  Remember: Instructions that are not followed completely may result in serious medical risk, up to and including death, or upon the discretion of your surgeon and anesthesiologist your surgery may need to be rescheduled.    _x___ 1. Do not eat food or drink liquids after midnight. No gum chewing or hard candies.     ____ 2. No Alcohol for 24 hours before or after surgery.   ____ 3. Bring all medications with you on the day of surgery if instructed.    __x__ 4. Notify your doctor if there is any change in your medical condition     (cold, fever, infections).     Do not wear jewelry, make-up, hairpins, clips or nail polish.  Do not wear lotions, powders, or perfumes. You may wear deodorant.  Do not shave 48 hours prior to surgery. Men may shave face and neck.  Do not bring valuables to the hospital.    [facility] is not responsible for any belongings or valuables.               Contacts, dentures or bridgework may not be worn into surgery.  Leave your suitcase in the car. After surgery it may be brought to your room.  For patients admitted to the hospital, discharge time is determined by your treatment team.   Patients discharged the day of surgery will not be allowed to drive home.    Please read over the following fact sheets that you were given:   [facility] Preparing for Surgery  _x___ Take these medicines the day of surgery, after dialysis with A SIP OF WATER:    1. pantoprazole (PROTONIX)  ____ Fleet Enema (as directed)   _x___ Use SAGE wipes as directed on instruction sheet  ____ Use inhalers on the day of surgery and bring to hospital day of surgery  ____ Stop metformin 2 days prior to surgery    ____ Take 1/2 of usual insulin dose the night before surgery and none on the morning  of surgery.   _x___ Stop Coumadin per Dr. Geoffry Paradise instruction.  ____ Stop Anti-inflammatories such as Advil, Aleve, Ibuprofen, Motrin, Naproxen,  Naprosyn, Goodies powders or aspirin products. OK to take Tylenol or Tramadol.   ____ Stop supplements until after surgery.    ____ Bring C-Pap to the hospital.

## 2015-08-15 ENCOUNTER — Inpatient Hospital Stay: Payer: Medicare Other | Attending: Oncology

## 2015-08-15 DIAGNOSIS — Z86718 Personal history of other venous thrombosis and embolism: Secondary | ICD-10-CM | POA: Insufficient documentation

## 2015-08-15 DIAGNOSIS — D696 Thrombocytopenia, unspecified: Secondary | ICD-10-CM | POA: Insufficient documentation

## 2015-08-15 DIAGNOSIS — D631 Anemia in chronic kidney disease: Secondary | ICD-10-CM | POA: Insufficient documentation

## 2015-08-15 DIAGNOSIS — N186 End stage renal disease: Secondary | ICD-10-CM | POA: Insufficient documentation

## 2015-08-15 DIAGNOSIS — Z79899 Other long term (current) drug therapy: Secondary | ICD-10-CM | POA: Insufficient documentation

## 2015-08-15 DIAGNOSIS — Z170 Estrogen receptor positive status [ER+]: Secondary | ICD-10-CM | POA: Insufficient documentation

## 2015-08-15 DIAGNOSIS — I509 Heart failure, unspecified: Secondary | ICD-10-CM | POA: Insufficient documentation

## 2015-08-15 DIAGNOSIS — Z992 Dependence on renal dialysis: Secondary | ICD-10-CM | POA: Insufficient documentation

## 2015-08-15 DIAGNOSIS — C50212 Malignant neoplasm of upper-inner quadrant of left female breast: Secondary | ICD-10-CM | POA: Insufficient documentation

## 2015-08-15 DIAGNOSIS — Z5111 Encounter for antineoplastic chemotherapy: Secondary | ICD-10-CM | POA: Insufficient documentation

## 2015-08-15 DIAGNOSIS — I129 Hypertensive chronic kidney disease with stage 1 through stage 4 chronic kidney disease, or unspecified chronic kidney disease: Secondary | ICD-10-CM | POA: Insufficient documentation

## 2015-08-15 NOTE — Pre-Procedure Instructions (Signed)
ED Provider Notes Date of Service: 08/07/2015 7:34 AM Delman Kitten, MD  Acute Care    [] Hide copied text [] Hover for attribution information   [facility] Emergency Department Provider Note   ____________________________________________   First MD Initiated Contact with Patient 08/07/15 0719     (approximate)  I have reviewed the triage vital signs and the nursing notes.   HISTORY  Chief Complaint Respiratory Distress  EM caveat: The patient is extremely short of breath, unable to speak short phrases or words  HPI Teresa Cook is a 60 y.o. female who presents for hypoxia  EMS reports patient called for respiratory distress. They noted significant hypertension, and she is a dialysis patient. Oxygen saturation 60% on arrival of EMS. She was placed on CPAP, given nitroglycerin sublingual and paste, and transported emergently for concerns of possible flash pulmonary edema.      Past Medical History:  Diagnosis Date  . Anemia   . Anxiety   . CHF (congestive heart failure) ([place])   . Chronic kidney disease   . Depression   . Dialysis patient ([place])   . DVT (deep venous thrombosis) ([facility])    left leg  . DVT (deep venous thrombosis) ([place])   . Gout   . HTN (hypertension)   . Hypertension   . Parathyroid abnormality ([place])   . Parathyroid disease ([place])   . Psoriasis         Patient Active Problem List   Diagnosis Date Noted  . Breast cancer of upper-inner quadrant of left female breast ([place]) 08/05/2015  . Pulmonary edema 07/09/2015  . Dizziness 03/03/2015  . Seizures ([place]) 03/03/2015  . Spells ([place]) 11/11/2014  . Acute respiratory failure ([place]) 09/19/2014  . Left ventricular dysfunction 09/06/2014  . Acute pulmonary edema ([place]) 08/30/2014  . Respiratory failure ([place]) 08/29/2014  . Respiratory difficulty 08/29/2014  . Chronic systolic heart failure ([place]) 05/16/2014  . Hypertension 05/16/2014  . Renal failure  05/16/2014  . Anxiety 05/16/2014  . Sickle cell trait ([place]) 03/03/2014  . ESRD (end stage renal disease) on dialysis ([place]) 11/02/2013  . Dysgeusia 01/27/2013  . Weight loss 01/27/2013  . Resting tremor 08/13/2012  . Depression 04/30/2012  . 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 Right leg DVT ([place]) 04/30/2012         Past Surgical History:  Procedure Laterality Date  . ABDOMINAL HYSTERECTOMY    . APPENDECTOMY    . BREAST BIOPSY Left 10/28/2013   benign  . BREAST EXCISIONAL BIOPSY Left 2002   benign  . PARTIAL HYSTERECTOMY             Prior to Admission medications   Medication Sig Start Date End Date Taking? Authorizing Provider  cinacalcet (SENSIPAR) 30 MG tablet Take 120 mg by mouth. 06/19/15  Yes Historical Provider, MD  clonazePAM (KLONOPIN) 0.5 MG tablet Take 1/2 tablet before dialysis, can take a whole tablet if needed 06/19/15  Yes Historical Provider, MD  losartan (COZAAR) 50 MG tablet Take 100 mg by mouth daily.  09/07/14  Yes Historical Provider, MD  metoprolol (LOPRESSOR) 100 MG tablet Take 50 mg by mouth 2 (two) times daily.   Yes Historical Provider, MD  pantoprazole (PROTONIX) 20 MG tablet Take 20 mg by mouth daily.    Yes Historical Provider, MD  sertraline (ZOLOFT) 100 MG tablet Take 100 mg by mouth at bedtime.  03/05/12  Yes Historical Provider, MD  sevelamer carbonate (RENVELA) 800 MG tablet Take 2,400 mg by mouth.  Yes Historical Provider, MD  warfarin (COUMADIN) 5 MG tablet Take 1 tablet Monday and Friday. Take 1 1/2 tablets the other days of the week 11/03/13  Yes Historical Provider, MD  zolpidem (AMBIEN) 10 MG tablet Take 10 mg by mouth at bedtime as needed for sleep.   Yes Historical Provider, MD  traMADol (ULTRAM) 50 MG tablet Take 50 mg by mouth every 6 (six) hours as needed.  02/20/15   Historical Provider, MD    Allergies Review of patient's allergies indicates no known allergies.       Family History    Problem Relation Age of Onset  . Stroke Mother   . CVA Mother   . Hypertension Mother   . Hypertension Father   . Hypertension Sister   . Diabetes Sister   . Stroke Brother   . Hypertension Brother   . Breast cancer Maternal Aunt 70    Social History     Social History  Substance Use Topics  . Smoking status: Never Smoker  . Smokeless tobacco: Never Used  . Alcohol use No    Review of Systems EM caveat   ____________________________________________   PHYSICAL EXAM:  VITAL SIGNS:     ED Triage Vitals  Enc Vitals Group     BP 08/07/15 0724 (!) 191/135     Pulse Rate 08/07/15 0724 92     Resp 08/07/15 0724 (!) 38     Temp 08/07/15 0730 97 F (36.1 C)     Temp Source 08/07/15 0730 Axillary     SpO2 08/07/15 0724 100 %     Weight 08/07/15 0724 158 lb 3.2 oz (71.8 kg)     Height --      Head Circumference --      Peak Flow --      Pain Score 08/07/15 0726 3     Pain Loc --      Pain Edu? --      Excl. in [place]? --     Constitutional: Somnolent, on CPAP. Patient sitting up, trying to vomit into a mask on arrival. Patient taken off CPAP and transferred on a recent nonrebreather, rolled to the side where she vomited. Patient then calm, and was placed on BiPAP which she is tolerating well thereafter. Eyes: Conjunctivae are normal. PERRL. EOMI. Head: Atraumatic. Nose: No congestion/rhinnorhea. Mouth/Throat: Mucous membranes are moist.  Oropharynx non-erythematous. Neck: No stridor. Significant JVD Cardiovascular: Tachycardic rate, regular rhythm. Grossly normal heart sounds.  Good peripheral circulation. Respiratory: Moderate to severe increased work of breathing, frequent but shallow inspirations with rails bilateral through the mid thorax. Gastrointestinal: Soft and nontender.  Musculoskeletal: No lower extremity tenderness nor edema. Neurologic:  Able to answer only simple questions such as her name. She does move all extremities with what  appears to be equal strength. Skin:  Skin is warm, dry and intact. No rash noted. Psychiatric: Mood and affect are anxious. ____________________________________________   LABS (all labs ordered are listed, but only abnormal results are displayed)       Labs Reviewed  CBC - Abnormal; Notable for the following:       Result Value    WBC 18.0 (*)    RBC 3.71 (*)    Hemoglobin 10.7 (*)    HCT 32.9 (*)    RDW 16.2 (*)    All other components within normal limits  BLOOD GAS, VENOUS - Abnormal; Notable for the following:    pH, Ven 7.21 (*)    pO2, Ven <  31.0 (*)    Bicarbonate 20.4 (*)    Acid-base deficit 7.5 (*)    All other components within normal limits  PROTIME-INR - Abnormal; Notable for the following:    Prothrombin Time 21.0 (*)    All other components within normal limits  COMPREHENSIVE METABOLIC PANEL  TROPONIN I  BRAIN NATRIURETIC PEPTIDE   ____________________________________________  EKG  Reviewed and interpreted by me at 7:27 AM Heart rate 90 QRS 90 QTC 500 Normal sinus rhythm, changes consistent with left ventricular hypertrophy, no evidence for acute MI, though cannot rule out ischemic changes ____________________________________________  RADIOLOGY  Dg Chest Port 1 View  Result Date: 08/07/2015 CLINICAL DATA:  Respiratory distress.  Chronic renal failure EXAM: PORTABLE CHEST 1 VIEW COMPARISON:  July 11, 2015. FINDINGS: There is generalized interstitial pulmonary edema. There is probable mild alveolar edema in the right base. There is cardiomegaly with mild pulmonary venous hypertension. There is atherosclerotic calcification in the aorta. Central catheter tip is in the superior vena cava near the cavoatrial junction. No pneumothorax. No evident bone lesions. IMPRESSION: Evidence of a degree of congestive heart failure. Suspect alveolar edema in the right base, although early pneumonia in this area is possible. There is aortic  atherosclerosis. No pneumothorax. Electronically Signed   By: Lowella Grip III M.D.   On: 08/07/2015 07:50   ____________________________________________   PROCEDURES  Procedure(s) performed: None  Procedures  Critical Care performed: Yes, see critical care note(s)  CRITICAL CARE Performed by: Delman Kitten   Total critical care time: 65 minutes  Critical care time was exclusive of separately billable procedures and treating other patients.  Critical care was necessary to treat or prevent imminent or life-threatening deterioration.  Critical care was time spent personally by me on the following activities: development of treatment plan with patient and/or surrogate as well as nursing, discussions with consultants, evaluation of patient's response to treatment, examination of patient, obtaining history from patient or surrogate, ordering and performing treatments and interventions, ordering and review of laboratory studies, ordering and review of radiographic studies, pulse oximetry and re-evaluation of patient's condition.  Critical care involves reevaluation, discussion and consultation with nephrology and critical care medicine on a stat basis. Patient requiring BiPAP, close management of blood pressure, titration of nitroglycerin. ____________________________________________   INITIAL IMPRESSION / ASSESSMENT AND PLAN / ED COURSE  Pertinent labs & imaging results that were available during my care of the patient were reviewed by me and considered in my medical decision making (see chart for details).  Patient is for acute respiratory failure. Patient transitioned to BiPAP, showing improvement. The patient is not febrile, and her history of being due for dialysis today and review of her chest x-ray and presentation with hypertension, hypoxia, seemed very most consistent with pulmonary edema and volume overload. However, I cannot exclude other diagnoses such as  early pneumonia. Discussed with and will admit the patient to the ICU with nephrology consultation for the need for emergent dialysis  Clinical Course   ----------------------------------------- 8:03 AM on 08/07/2015 -----------------------------------------  The patient is resting much more comfortably now. She reports significant improvement. She is resting, awaiting nephrology and critical care consults. The patient appears much improved.    ____________________________________________   FINAL CLINICAL IMPRESSION(S) / ED DIAGNOSES  Final diagnoses:  Acute respiratory failure with hypoxia ([facility])  Respiratory distress  Acute pulmonary edema ([facility])      NEW MEDICATIONS STARTED DURING THIS VISIT:     New Prescriptions   No medications on file  Note:  This document was prepared using Dragon voice recognition software and may include unintentional dictation errors.     Delman Kitten, MD 08/07/15 [phone]     Unable to print EKG from ED visit on 08/07/15 however was able to print EKG from 07/09/15, see chart.

## 2015-08-17 ENCOUNTER — Telehealth: Payer: Self-pay | Admitting: *Deleted

## 2015-08-17 NOTE — Telephone Encounter (Signed)
  Oncology Nurse Navigator Documentation  Navigator Location: [facility] (08/17/15 1400) Navigator Encounter Type: Other (08/17/15 1400)         Treatment Initiated Date: 08/24/15 (08/17/15 1400)                      Acuity: Level 2 (08/17/15 1400)     Acuity Level 3: Emotional needs (08/17/15 1400)   Time Spent with Patient: 15 (08/17/15 1400)   Called patient to follow-up with any questions or needs she may have.  Still has some anger related to new diagnosis.  Offered support.

## 2015-08-21 ENCOUNTER — Ambulatory Visit: Payer: Medicare Other | Admitting: Anesthesiology

## 2015-08-21 ENCOUNTER — Ambulatory Visit: Payer: Medicare Other

## 2015-08-21 ENCOUNTER — Ambulatory Visit
Admission: RE | Admit: 2015-08-21 | Discharge: 2015-08-21 | Disposition: A | Discharge status: Home / Self Care | Payer: Medicare Other | Source: Ambulatory Visit | Attending: Surgery | Admitting: Surgery

## 2015-08-21 ENCOUNTER — Encounter
Admission: RE | Disposition: A | Discharge status: Home / Self Care | Payer: Self-pay | Source: Ambulatory Visit | Attending: Surgery

## 2015-08-21 ENCOUNTER — Encounter: Payer: Self-pay | Admitting: *Deleted

## 2015-08-21 DIAGNOSIS — Z452 Encounter for adjustment and management of vascular access device: Secondary | ICD-10-CM | POA: Diagnosis not present

## 2015-08-21 DIAGNOSIS — F329 Major depressive disorder, single episode, unspecified: Secondary | ICD-10-CM | POA: Diagnosis not present

## 2015-08-21 DIAGNOSIS — F419 Anxiety disorder, unspecified: Secondary | ICD-10-CM | POA: Diagnosis not present

## 2015-08-21 DIAGNOSIS — Z9889 Other specified postprocedural states: Secondary | ICD-10-CM

## 2015-08-21 DIAGNOSIS — Z803 Family history of malignant neoplasm of breast: Secondary | ICD-10-CM | POA: Insufficient documentation

## 2015-08-21 DIAGNOSIS — Z833 Family history of diabetes mellitus: Secondary | ICD-10-CM | POA: Diagnosis not present

## 2015-08-21 DIAGNOSIS — C773 Secondary and unspecified malignant neoplasm of axilla and upper limb lymph nodes: Secondary | ICD-10-CM

## 2015-08-21 DIAGNOSIS — Z823 Family history of stroke: Secondary | ICD-10-CM | POA: Insufficient documentation

## 2015-08-21 DIAGNOSIS — M109 Gout, unspecified: Secondary | ICD-10-CM | POA: Diagnosis not present

## 2015-08-21 DIAGNOSIS — N186 End stage renal disease: Secondary | ICD-10-CM | POA: Insufficient documentation

## 2015-08-21 DIAGNOSIS — I132 Hypertensive heart and chronic kidney disease with heart failure and with stage 5 chronic kidney disease, or end stage renal disease: Secondary | ICD-10-CM | POA: Diagnosis not present

## 2015-08-21 DIAGNOSIS — I509 Heart failure, unspecified: Secondary | ICD-10-CM | POA: Diagnosis not present

## 2015-08-21 DIAGNOSIS — C50912 Malignant neoplasm of unspecified site of left female breast: Secondary | ICD-10-CM | POA: Diagnosis present

## 2015-08-21 DIAGNOSIS — Z992 Dependence on renal dialysis: Secondary | ICD-10-CM | POA: Insufficient documentation

## 2015-08-21 DIAGNOSIS — L409 Psoriasis, unspecified: Secondary | ICD-10-CM | POA: Insufficient documentation

## 2015-08-21 DIAGNOSIS — Z7901 Long term (current) use of anticoagulants: Secondary | ICD-10-CM | POA: Diagnosis not present

## 2015-08-21 DIAGNOSIS — Z86718 Personal history of other venous thrombosis and embolism: Secondary | ICD-10-CM | POA: Insufficient documentation

## 2015-08-21 DIAGNOSIS — Z79899 Other long term (current) drug therapy: Secondary | ICD-10-CM | POA: Diagnosis not present

## 2015-08-21 HISTORY — PX: PORTACATH PLACEMENT: SHX2246

## 2015-08-21 LAB — POCT I-STAT 4, (NA,K, GLUC, HGB,HCT)
GLUCOSE: 88 mg/dL (ref 65–99)
HEMATOCRIT: 29 % — AB (ref 36.0–46.0)
Hemoglobin: 9.9 g/dL — ABNORMAL LOW (ref 12.0–15.0)
Potassium: 4.6 mmol/L (ref 3.5–5.1)
Sodium: 137 mmol/L (ref 135–145)

## 2015-08-21 SURGERY — INSERTION, TUNNELED CENTRAL VENOUS DEVICE, WITH PORT
Anesthesia: General | Laterality: Left | Wound class: Clean

## 2015-08-21 MED ORDER — FENTANYL CITRATE (PF) 100 MCG/2ML IJ SOLN
INTRAMUSCULAR | Status: DC | PRN
  Administered 2015-08-21 (×2): 50 ug via INTRAVENOUS

## 2015-08-21 MED ORDER — PROPOFOL 10 MG/ML IV BOLUS
INTRAVENOUS | Status: DC | PRN
  Administered 2015-08-21 (×3): 20 ug via INTRAVENOUS
  Administered 2015-08-21: 30 ug via INTRAVENOUS

## 2015-08-21 MED ORDER — HYDROCODONE-ACETAMINOPHEN 5-325 MG PO TABS: 1.0000 | ORAL_TABLET | ORAL | 0 refills | Status: DC | PRN

## 2015-08-21 MED ORDER — BUPIVACAINE HCL (PF) 0.5 % IJ SOLN
INTRAMUSCULAR | Status: DC | PRN
  Administered 2015-08-21: 20 mL

## 2015-08-21 MED ORDER — EPHEDRINE SULFATE 50 MG/ML IJ SOLN
INTRAMUSCULAR | Status: DC | PRN
  Administered 2015-08-21 (×2): 5 mg via INTRAVENOUS

## 2015-08-21 MED ORDER — SODIUM CHLORIDE 0.9 % IV SOLN
INTRAVENOUS | Status: DC | PRN
  Administered 2015-08-21: 50.5 mL via INTRAMUSCULAR

## 2015-08-21 MED ORDER — ONDANSETRON HCL 4 MG/2ML IJ SOLN: 4.0000 mg | Freq: Once | INTRAMUSCULAR | Status: DC | PRN

## 2015-08-21 MED ORDER — PHENYLEPHRINE HCL 10 MG/ML IJ SOLN
INTRAMUSCULAR | Status: DC | PRN
  Administered 2015-08-21: 100 ug via INTRAVENOUS
  Administered 2015-08-21: 50 ug via INTRAVENOUS
  Administered 2015-08-21: 100 ug via INTRAVENOUS
  Administered 2015-08-21 (×2): 50 ug via INTRAVENOUS

## 2015-08-21 MED ORDER — SODIUM CHLORIDE 0.9 % IV SOLN
INTRAVENOUS | Status: DC
  Administered 2015-08-21: 07:00:00 via INTRAVENOUS

## 2015-08-21 MED ORDER — FENTANYL CITRATE (PF) 100 MCG/2ML IJ SOLN
INTRAMUSCULAR | Status: AC
  Administered 2015-08-21: 25 ug via INTRAVENOUS
  Filled 2015-08-21: qty 2

## 2015-08-21 MED ORDER — CEFAZOLIN SODIUM-DEXTROSE 2-4 GM/100ML-% IV SOLN
2.0000 g | INTRAVENOUS | Status: AC
  Administered 2015-08-21: 2 g via INTRAVENOUS

## 2015-08-21 MED ORDER — MIDAZOLAM HCL 2 MG/2ML IJ SOLN
INTRAMUSCULAR | Status: DC | PRN
  Administered 2015-08-21 (×2): 1 mg via INTRAVENOUS

## 2015-08-21 MED ORDER — BUPIVACAINE HCL (PF) 0.5 % IJ SOLN
INTRAMUSCULAR | Status: AC
  Filled 2015-08-21: qty 30

## 2015-08-21 MED ORDER — SODIUM CHLORIDE 0.9 % IJ SOLN
INTRAMUSCULAR | Status: AC
  Filled 2015-08-21: qty 50

## 2015-08-21 MED ORDER — PROPOFOL 500 MG/50ML IV EMUL
INTRAVENOUS | Status: DC | PRN
  Administered 2015-08-21: 60 ug/kg/min via INTRAVENOUS

## 2015-08-21 MED ORDER — CEFAZOLIN SODIUM-DEXTROSE 2-4 GM/100ML-% IV SOLN
INTRAVENOUS | Status: AC
  Administered 2015-08-21: 2 g via INTRAVENOUS
  Filled 2015-08-21: qty 100

## 2015-08-21 MED ORDER — CHLORHEXIDINE GLUCONATE CLOTH 2 % EX PADS: 6.0000 | MEDICATED_PAD | Freq: Once | CUTANEOUS | Status: DC

## 2015-08-21 MED ORDER — FENTANYL CITRATE (PF) 100 MCG/2ML IJ SOLN
25.0000 ug | INTRAMUSCULAR | Status: DC | PRN
  Administered 2015-08-21 (×4): 25 ug via INTRAVENOUS

## 2015-08-21 MED ORDER — HEPARIN SODIUM (PORCINE) 5000 UNIT/ML IJ SOLN
INTRAMUSCULAR | Status: AC
  Filled 2015-08-21: qty 1

## 2015-08-21 SURGICAL SUPPLY — 31 items
BLADE SURG 15 STRL LF DISP TIS (BLADE) ×1 IMPLANT
BLADE SURG 15 STRL SS (BLADE) ×1
CANISTER SUCT 1200ML W/VALVE (MISCELLANEOUS) ×2 IMPLANT
CHLORAPREP W/TINT 26ML (MISCELLANEOUS) ×2 IMPLANT
COVER LIGHT HANDLE STERIS (MISCELLANEOUS) ×4 IMPLANT
DECANTER SPIKE VIAL GLASS SM (MISCELLANEOUS) ×2 IMPLANT
DRAPE C-ARM XRAY 36X54 (DRAPES) ×2 IMPLANT
ELECT REM PT RETURN 9FT ADLT (ELECTROSURGICAL) ×2
ELECTRODE REM PT RTRN 9FT ADLT (ELECTROSURGICAL) ×1 IMPLANT
GLOVE BIO SURGEON STRL SZ 6.5 (GLOVE) ×2 IMPLANT
GLOVE PI ORTHOPRO 6.5 (GLOVE) ×1
GLOVE PI ORTHOPRO STRL 6.5 (GLOVE) ×1 IMPLANT
GOWN STRL REUS W/ TWL LRG LVL3 (GOWN DISPOSABLE) ×2 IMPLANT
GOWN STRL REUS W/TWL LRG LVL3 (GOWN DISPOSABLE) ×2
KIT PORT POWER 8FR ISP CVUE (Catheter) ×2 IMPLANT
KIT RM TURNOVER STRD PROC AR (KITS) ×2 IMPLANT
LABEL OR SOLS (LABEL) IMPLANT
LIQUID BAND (GAUZE/BANDAGES/DRESSINGS) ×2 IMPLANT
NDL SAFETY ECLIPSE 18X1.5 (NEEDLE) ×1 IMPLANT
NEEDLE FILTER BLUNT 18X 1/2SAF (NEEDLE) ×1
NEEDLE FILTER BLUNT 18X1 1/2 (NEEDLE) ×1 IMPLANT
NEEDLE HYPO 18GX1.5 SHARP (NEEDLE) ×1
PACK PORT-A-CATH (MISCELLANEOUS) ×2 IMPLANT
SUT MNCRL 4-0 (SUTURE) ×1
SUT MNCRL 4-0 27XMFL (SUTURE) ×1
SUT PROLENE 3 0 SH DA (SUTURE) ×2 IMPLANT
SUT VIC AB 3-0 SH 27 (SUTURE) ×1
SUT VIC AB 3-0 SH 27X BRD (SUTURE) ×1 IMPLANT
SUTURE MNCRL 4-0 27XMF (SUTURE) ×1 IMPLANT
SYR 3ML LL SCALE MARK (SYRINGE) ×4 IMPLANT
SYRINGE 10CC LL (SYRINGE) ×2 IMPLANT

## 2015-08-21 NOTE — Progress Notes (Signed)
Pain level 2 at discharge

## 2015-08-21 NOTE — Anesthesia Procedure Notes (Signed)
Procedure Name: MAC Date/Time: 08/21/2015 7:32 AM Performed by: Jenetta Downer Pre-anesthesia Checklist: Patient identified, Emergency Drugs available, Suction available and Patient being monitored Oxygen Delivery Method: Simple face mask Preoxygenation: Pre-oxygenation with 100% oxygen

## 2015-08-21 NOTE — Anesthesia Preprocedure Evaluation (Signed)
Anesthesia Evaluation  Patient identified by MRN, date of birth, ID band Patient awake    Reviewed: Allergy & Precautions, H&P , NPO status , Patient's Chart, lab work & pertinent test results, reviewed documented beta blocker date and time   History of Anesthesia Complications Negative for: history of anesthetic complications  Airway Mallampati: III  TM Distance: >3 FB Neck ROM: full    Dental no notable dental hx. (+) Missing   Pulmonary neg pulmonary ROS,    Pulmonary exam normal breath sounds clear to auscultation       Cardiovascular Exercise Tolerance: Good hypertension, (-) angina+ Peripheral Vascular Disease and +CHF  (-) CAD, (-) Past MI, (-) Cardiac Stents and (-) CABG Normal cardiovascular exam(-) dysrhythmias (-) Valvular Problems/Murmurs Rhythm:regular Rate:Normal     Neuro/Psych Seizures -, Well Controlled,  PSYCHIATRIC DISORDERS (Depression)    GI/Hepatic negative GI ROS, Neg liver ROS,   Endo/Other  negative endocrine ROS  Renal/GU ESRF and DialysisRenal disease  negative genitourinary   Musculoskeletal   Abdominal   Peds  Hematology  (+) Blood dyscrasia, anemia ,   Anesthesia Other Findings Past Medical History: No date: Anemia No date: Anxiety No date: CHF (congestive heart failure) (HCC) No date: Chronic kidney disease No date: Depression No date: Dialysis patient ([place]) No date: DVT (deep venous thrombosis) ([place])     Comment: left leg No date: DVT (deep venous thrombosis) (HCC) No date: Gout No date: HTN (hypertension) No date: Hypertension No date: Parathyroid abnormality (HCC) No date: Parathyroid disease ([place]) No date: Psoriasis   Reproductive/Obstetrics negative OB ROS                             Anesthesia Physical Anesthesia Plan  ASA: IV  Anesthesia Plan: General   Post-op Pain Management:    Induction:   Airway Management Planned:    Additional Equipment:   Intra-op Plan:   Post-operative Plan:   Informed Consent: I have reviewed the patients History and Physical, chart, labs and discussed the procedure including the risks, benefits and alternatives for the proposed anesthesia with the patient or authorized representative who has indicated his/her understanding and acceptance.   Dental Advisory Given  Plan Discussed with: Anesthesiologist, CRNA and Surgeon  Anesthesia Plan Comments:         Anesthesia Quick Evaluation

## 2015-08-21 NOTE — Interval H&P Note (Signed)
History and Physical Interval Note:  08/21/2015 7:22 AM  Teresa Cook  has presented today for surgery, with the diagnosis of breast cancer  The various methods of treatment have been discussed with the patient and family. After consideration of risks, benefits and other options for treatment, the patient has consented to  Procedure(s): INSERTION PORT-A-CATH (Left) as a surgical intervention .  The patient's history has been reviewed, patient examined, no change in status, stable for surgery.  I have reviewed the patient's chart and labs.  Questions were answered to the patient's satisfaction.     [name] L Gene Glazebrook

## 2015-08-21 NOTE — Op Note (Signed)
  Pre-operative Diagnosis: Left breast cancer  Post-operative Diagnosis: same   Surgeon:Linetta Regner, MD  Anesthesia: IV sedation, marcaine .25% w epi and lidocaine 1%  Procedure: left IJ  Port placement with fluoroscopy under U/S guidance  Findings: Good position of the tip of the catheter by fluoroscopy  Estimated Blood Loss: Minimal         Drains: None         Specimens: None       Complications: none            Procedure Details  The patient was seen again in the Holding Room. The benefits, complications, treatment options, and expected outcomes were discussed with the patient. The risks of bleeding, infection, recurrence of symptoms, failure to resolve symptoms,  thrombosis nonfunction breakage pneumothorax hemopneumothorax any of which could require chest tube or further surgery were reviewed with the patient.   The patient was taken to Operating Room, identified as Teresa Cook and the procedure verified.  A Time Out was held and the above information confirmed.  Prior to the induction of general anesthesia, antibiotic prophylaxis was administered. VTE prophylaxis was in place. Appropriate anesthesia was then administered and tolerated well. The chest was prepped with Chloraprep and draped in the sterile fashion. The patient was positioned in the supine position. Then the patient was placed in Trendelenburg position.  Patient was prepped and draped in sterile fashion and in a Trendelenburg position local anesthetic was infiltrated into the skin and subcutaneous tissues in the neck and anterior chest wall. The large bore needle was placed into the internal jugular vein under U/S guidance without difficulty and then the Seldinger wire was advanced. Fluoroscopy was utilized to confirm that the Seldinger wire was in the superior vena cava.  An incision was made and a port pocket developed with blunt and electrocautery dissection. The introducer dilator was placed over the  Seldinger wire the wire was removed. The previously flushed catheter was placed into the introducer dilator and the peel-away sheath was removed. The catheter length was confirmed and trimmed utilizing fluoroscopy for proper positioning. The catheter was then attached to the previously flushed port. The port was placed into the pocket. The port was held in with 2-0 Prolenes and flushed for function and heparin locked.  There was some difficulty drawing back on the catheter and positioning rechecked with fluroscopy in good position below the placement of the dialysis catheter.  The port would draw back blood slowly but did function and flushed well without any difficulties.   The wound was closed with interrupted 3-0 Vicryl followed by 4-0 subcuticular Monocryl sutures. Dermabond used to coat the skin  Sponge and instrument counts were correct at the end of the case.

## 2015-08-21 NOTE — Transfer of Care (Signed)
Immediate Anesthesia Transfer of Care Note  Patient: Teresa Cook  Procedure(s) Performed: Procedure(s): INSERTION PORT-A-CATH (Left)  Patient Location: PACU  Anesthesia Type:MAC  Level of Consciousness: awake  Airway & Oxygen Therapy: Patient Spontanous Breathing  Post-op Assessment: Report given to RN and Post -op Vital signs reviewed and stable  Post vital signs: Reviewed and stable  Last Vitals:  Vitals:   08/21/15 0616 08/21/15 0854  BP: (!) 175/110 (!) 153/90  Pulse: 87 67  Resp:  15  Temp: 36.2 C 36.3 C    Last Pain:  Vitals:   08/21/15 0854  TempSrc:   PainSc: Asleep         Complications: No apparent anesthesia complications

## 2015-08-21 NOTE — Transfer of Care (Signed)
Immediate Anesthesia Transfer of Care Note  Patient: Teresa Cook  Procedure(s) Performed: Procedure(s): INSERTION PORT-A-CATH (Left)  Patient Location: PACU  Anesthesia Type:MAC  Level of Consciousness: awake, alert  and oriented  Airway & Oxygen Therapy: Patient Spontanous Breathing  Post-op Assessment: Report given to RN and Post -op Vital signs reviewed and stable  Post vital signs: Reviewed and stable  Last Vitals:  Vitals:   08/21/15 0616 08/21/15 0854  BP: (!) 175/110 (!) 153/90  Pulse: 87 67  Resp:  15  Temp: 36.2 C 36.3 C    Last Pain:  Vitals:   08/21/15 0616  TempSrc: Tympanic         Complications: No apparent anesthesia complications

## 2015-08-21 NOTE — Anesthesia Postprocedure Evaluation (Signed)
Anesthesia Post Note  Patient: Teresa Cook  Procedure(s) Performed: Procedure(s) (LRB): INSERTION PORT-A-CATH (Left)  Patient location during evaluation: PACU Anesthesia Type: General Level of consciousness: awake and alert Pain management: pain level controlled Vital Signs Assessment: post-procedure vital signs reviewed and stable Respiratory status: spontaneous breathing, nonlabored ventilation, respiratory function stable and patient connected to nasal cannula oxygen Cardiovascular status: blood pressure returned to baseline and stable Postop Assessment: no signs of nausea or vomiting Anesthetic complications: no    Last Vitals:  Vitals:   08/21/15 1002 08/21/15 1007  BP: (!) 177/100 (!) 174/100  Pulse: 73 65  Resp: 16   Temp: 36.6 C     Last Pain:  Vitals:   08/21/15 1007  TempSrc:   PainSc: 2                  Martha Clan

## 2015-08-21 NOTE — Anesthesia Postprocedure Evaluation (Signed)
Anesthesia Post Note  Patient: Teresa Cook  Procedure(s) Performed: Procedure(s) (LRB): INSERTION PORT-A-CATH (Left)  Anesthesia Post Evaluation  Last Vitals:  Vitals:   08/21/15 0616 08/21/15 0854  BP: (!) 175/110 (!) 153/90  Pulse: 87 67  Resp:  15  Temp: 36.2 C 36.3 C    Last Pain:  Vitals:   08/21/15 0616  TempSrc: Tympanic                 Ferre[place],  Diego Cory

## 2015-08-21 NOTE — Discharge Instructions (Signed)
AMBULATORY SURGERY  °DISCHARGE INSTRUCTIONS ° ° °1) The drugs that you were given will stay in your system until tomorrow so for the next 24 hours you should not: ° °A) Drive an automobile °B) Make any legal decisions °C) Drink any alcoholic beverage ° ° °2) You may resume regular meals tomorrow.  Today it is better to start with liquids and gradually work up to solid foods. ° °You may eat anything you prefer, but it is better to start with liquids, then soup and crackers, and gradually work up to solid foods. ° ° °3) Please notify your doctor immediately if you have any unusual bleeding, trouble breathing, redness and pain at the surgery site, drainage, fever, or pain not relieved by medication. ° ° ° °4) Additional Instructions: ° ° ° ° ° ° ° °Please contact your physician with any problems or Same Day Surgery at [phone], Monday through Friday 6 am to 4 pm, or [place] [facility] [place] Main number at [phone]. °

## 2015-08-21 NOTE — H&P (View-Only) (Signed)
Subjective:     Patient ID: Teresa Cook, female   DOB: 10/13/1955, 60 y.o.   MRN: [id number]  HPI  60-year-old female who has end-stage renal disease on dialysis has been found to have a palpable left breast invasive mammary carcinoma as well as in her lymph nodes. The patient is here to discuss her surgical options. The patient first started menses was she was age 12 she did have 3 children the first at the age of 16. She did not breast-feed. Patient was on birth control for about 15 years. Patient has not taken any hormone replacement therapy. The patient stated that she had been keeping up with her normal mammograms and that they have biopsied this area. Patient states the last time she had a mammogram was 6 months prior to this one. The patient states that she did notice the mass and then new that her next mammogram was coming up. She denies any skin changes or nipple discharge or any pain in the breast. The patient does have a family history of breast cancer in her aunt and in her sister.   Past Medical History:  Diagnosis Date  . Anemia   . Anxiety   . CHF (congestive heart failure) (HCC)   . Chronic kidney disease   . Depression   . Dialysis patient (HCC)   . DVT (deep venous thrombosis) (HCC)    left leg  . DVT (deep venous thrombosis) (HCC)   . Gout   . HTN (hypertension)   . Hypertension   . Parathyroid abnormality (HCC)   . Parathyroid disease (HCC)   . Psoriasis    Past Surgical History:  Procedure Laterality Date  . ABDOMINAL HYSTERECTOMY    . APPENDECTOMY    . BREAST BIOPSY Left 10/28/2013   benign  . BREAST EXCISIONAL BIOPSY Left 2002   benign  . PARTIAL HYSTERECTOMY     Family History  Problem Relation Age of Onset  . Stroke Mother   . CVA Mother   . Hypertension Mother   . Hypertension Father   . Hypertension Sister   . Diabetes Sister   . Cancer Sister 70    Breast  . Stroke Brother   . Hypertension Brother   . Breast cancer Maternal Aunt 70    Social History   Social History  . Marital status: Married    Spouse name: N/A  . Number of children: N/A  . Years of education: N/A   Social History Main Topics  . Smoking status: Never Smoker  . Smokeless tobacco: Never Used  . Alcohol use No  . Drug use: No  . Sexual activity: Not Asked   Other Topics Concern  . None   Social History Narrative   ** Merged History Encounter **        Current Outpatient Prescriptions:  .  cinacalcet (SENSIPAR) 30 MG tablet, Take 120 mg by mouth., Disp: , Rfl:  .  clonazePAM (KLONOPIN) 0.5 MG tablet, Take 1/2 tablet before dialysis, can take a whole tablet if needed, Disp: , Rfl:  .  losartan (COZAAR) 50 MG tablet, Take 100 mg by mouth daily. , Disp: , Rfl:  .  metoprolol (LOPRESSOR) 100 MG tablet, Take 50 mg by mouth 2 (two) times daily., Disp: , Rfl:  .  pantoprazole (PROTONIX) 20 MG tablet, Take 20 mg by mouth daily. , Disp: , Rfl:  .  sertraline (ZOLOFT) 100 MG tablet, Take 100 mg by mouth at bedtime. , Disp: , Rfl:  .    sevelamer carbonate (RENVELA) 800 MG tablet, Take 2,400 mg by mouth., Disp: , Rfl:  .  traMADol (ULTRAM) 50 MG tablet, Take 50 mg by mouth every 6 (six) hours as needed. , Disp: , Rfl:  .  warfarin (COUMADIN) 5 MG tablet, Take 1 tablet Monday and Friday. Take 1 1/2 tablets the other days of the week, Disp: , Rfl:  .  zolpidem (AMBIEN) 10 MG tablet, Take 10 mg by mouth at bedtime as needed for sleep., Disp: , Rfl:  No Known Allergies   Review of Systems  Constitutional: Negative for activity change, appetite change, fatigue, fever and unexpected weight change.  HENT: Negative for congestion and sore throat.   Respiratory: Positive for cough and shortness of breath. Negative for wheezing.   Cardiovascular: Negative for chest pain, palpitations and leg swelling.  Gastrointestinal: Negative for abdominal distention, constipation and diarrhea.  Genitourinary: Negative for dysuria and urgency.  Musculoskeletal: Negative  for back pain and myalgias.  Skin: Negative for color change, pallor, rash and wound.  Neurological: Negative for dizziness and weakness.  Hematological: Positive for adenopathy. Does not bruise/bleed easily.  Psychiatric/Behavioral: Negative for agitation. The patient is not nervous/anxious.   All other systems reviewed and are negative.  Vitals:   08/09/15 1441  BP: (!) 160/95  Pulse: 80  Temp: 98.9 F (37.2 C)       Objective:   Physical Exam  Constitutional: She is oriented to person, place, and time. She appears well-developed and well-nourished. No distress.  HENT:  Head: Normocephalic and atraumatic.  Right Ear: External ear normal.  Left Ear: External ear normal.  Nose: Nose normal.  Mouth/Throat: Oropharynx is clear and moist. No oropharyngeal exudate.  Eyes: Conjunctivae and EOM are normal. Pupils are equal, round, and reactive to light. No scleral icterus.  Neck: Normal range of motion. Neck supple. No tracheal deviation present.  Cardiovascular: Normal rate, regular rhythm, normal heart sounds and intact distal pulses.  Exam reveals no gallop and no friction rub.   No murmur heard. Pulmonary/Chest: Effort normal and breath sounds normal. No respiratory distress. She has no wheezes. She has no rales.  Right chest dialysis catheter  Abdominal: Soft. Bowel sounds are normal. She exhibits no distension. There is no tenderness. There is no rebound.  Musculoskeletal: Normal range of motion. She exhibits no edema, tenderness or deformity.  Lymphadenopathy:    She has no cervical adenopathy.  Neurological: She is alert and oriented to person, place, and time. No cranial nerve deficit.  Skin: Skin is warm and dry. No rash noted. No erythema. No pallor.  Psychiatric: She has a normal mood and affect. Her behavior is normal. Judgment and thought content normal.  Vitals reviewed.  Breast exam: Right breast: No palpable masses and normal skin no nipple discharge or retraction,  no lymphadenopathy in the axilla or supraclavicular region  Left breast: No skin abnormalities nipple retraction or nipple discharge, 3 cm mass in the 3:00 position approximately 2 cm away from the nipple area or complex, approximately 3 cm posterior to this another 3 cm oblong mass is hard and fixed, in the axilla another 3 cm mass is hard and fixed.    CBC Latest Ref Rng & Units 08/08/2015 08/08/2015 08/07/2015  WBC 3.6 - 11.0 K/uL 8.6 8.6 18.0(H)  Hemoglobin 12.0 - 16.0 g/dL 9.4(L) 9.1(L) 10.7(L)  Hematocrit 35.0 - 47.0 % 27.4(L) 26.6(L) 32.9(L)  Platelets 150 - 440 K/uL 160 139(L) 260   CMP Latest Ref Rng & Units 08/08/2015   08/08/2015 08/07/2015  Glucose 65 - 99 mg/dL 104(H) 97 173(H)  BUN 6 - 20 mg/dL 42(H) 38(H) 53(H)  Creatinine 0.44 - 1.00 mg/dL 7.36(H) 6.87(H) 9.01(H)  Sodium 135 - 145 mmol/L 138 138 141  Potassium 3.5 - 5.1 mmol/L 4.3 4.2 4.7  Chloride 101 - 111 mmol/L 100(L) 100(L) 107  CO2 22 - 32 mmol/L 28 27 20(L)  Calcium 8.9 - 10.3 mg/dL 7.6(L) 7.2(L) 7.7(L)  Total Protein 6.5 - 8.1 g/dL - - 7.2  Total Bilirubin 0.3 - 1.2 mg/dL - - 0.6  Alkaline Phos 38 - 126 U/L - - 1,043(H)  AST 15 - 41 U/L - - 19  ALT 14 - 54 U/L - - 14   Biopsy: Er+ PR+ Her2 - 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 Personally reviewed the patient's laboratory values which are relatively normal at this time. I have also reviewed her imaging to include her mammograms and her ultrasounds which do show these large masses in the 3:00 position of her left breast. I also reviewed the mammograms that we have on file and her last mammogram prior to this was more than a year before.   I had a very extensive discussion with this patient including the risks benefits options  and alternatives. I discussed with the patient that at this time the only surgical option for her currently would be a mastectomy with an axillary node dissection given the positivity of the nodes in the axilla. I did state that if we did try chemotherapy that we may be able to do breast conservative therapy but may still need to fully take all the lymph nodes out of the left axilla. I discussed with the patient that there are multiple options that are available and we will have to see how she responds to the chemotherapy to determine if a lumpectomy be appropriate for her in the future. The patient would like to attempt to do chemotherapy and then try this. Patient is very much not interested in having radiation however and I did recommend that she meet with Dr. Crystal and learn a little bit more about the way we do radiation therapy here in the meantime. If the patient does not want radiation therapy then a mastectomy will be her only surgical option.  The patient is agreeable to start chemotherapy at this time and also direction she would like to go. I did discuss that the patient will likely need a port placed for this as usually is not given to dialysis catheters. I discussed the risk benefits alternatives with the patient to include a risk of bleeding, infection, need for removal of the port, kinking or clotting off of the port, small risk of pneumothorax and need for a chest tube placement. The patient was given opportunity to ask questions and have them answered. We will schedule the port placement to be done in the next couple weeks.  I spent 75 minutes with the patient with over 50% of that time spent on education and counseling.      

## 2015-08-21 NOTE — Brief Op Note (Signed)
08/21/2015  9:24 AM  PATIENT:  Teresa Cook  60 y.o. female  PRE-OPERATIVE DIAGNOSIS:  breast cancer  POST-OPERATIVE DIAGNOSIS:  breast cancer  PROCEDURE:  Procedure(s): INSERTION PORT-A-CATH (Left)  SURGEON:  Surgeon(s) and Role:    * Hubbard Robinson, MD - Primary  PHYSICIAN ASSISTANT: none  ASSISTANTS: none   ANESTHESIA:   local and MAC  EBL:  Total I/O In: 250 [I.V.:250] Out: 20 [Blood:20]  BLOOD ADMINISTERED:none  DRAINS: none   LOCAL MEDICATIONS USED:  MARCAINE     SPECIMEN:  No Specimen  DISPOSITION OF SPECIMEN:  N/A  COUNTS:  YES  TOURNIQUET:  * No tourniquets in log *  DICTATION: .Dragon Dictation  PLAN OF CARE: Discharge to home after PACU  PATIENT DISPOSITION:  PACU - hemodynamically stable.   Delay start of Pharmacological VTE agent (>24hrs) due to surgical blood loss or risk of bleeding: not applicable

## 2015-08-21 NOTE — Progress Notes (Signed)
Patient with complaint of central chest pain and neck pain following operation.  She states feels of pressure.  The patient states increase of pain with deep breath.   PE:  Gen: NAD Chest: RRR, no murmur, tenderness to palpation along lower sternum, which patient states is the pain she has been having Res: CTAB/L  Wound: port site clean and dry with sterile glue in place  The patient had EKG which appeared consistent with preop EKG no evidence of ST changes.  And Chest Xray that showed good placement of port and no evidence of pneumothorax to my read.  Will await final radiology read.  But I do feel this chest pain more likely represents some costochondritis.

## 2015-08-22 ENCOUNTER — Telehealth: Payer: Self-pay | Admitting: *Deleted

## 2015-08-22 MED ORDER — LIDOCAINE-PRILOCAINE 2.5-2.5 % EX CREA: 1.0000 "application " | TOPICAL_CREAM | CUTANEOUS | 2 refills | Status: DC | PRN

## 2015-08-22 NOTE — Progress Notes (Signed)
  Oncology Nurse Navigator Documentation  Navigator Location: [facility] (08/22/15 1100) Navigator Encounter Type: Treatment;Telephone (08/22/15 1100)             Treatment Phase: Pre-Tx/Tx Discussion (08/22/15 1100) Barriers/Navigation Needs: Coordination of Care (08/22/15 1100) Education: Pain/ Symptom Management;Preparing for Upcoming Surgery/ Treatment (08/22/15 1100)              Acuity: Level 2 (08/22/15 1100)     Acuity Level 3: Ongoing guidance and education provided throughout treatment;Coordination of multimodality treatment (Pre-medication) (08/22/15 1100)   Time Spent with Patient: 30 (08/22/15 1100)  Patient phoned requesting prescription for EMLA cream.  She is to begin chemotherapy 08/24/15.  Doing well since port placement yesterday.  Phoned [facility] RN to send prescription for EMLA cream to [facility].

## 2015-08-22 NOTE — Telephone Encounter (Signed)
Emla sent to [facility] per pt request

## 2015-08-22 NOTE — Telephone Encounter (Signed)
Prescription has been sent to patient's pharmacy

## 2015-08-22 NOTE — Progress Notes (Signed)
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  INTERVAL HISTORY: Patient returns to clinic today for further evaluation and initiation of cycle 1 of 4 of neoadjuvant Taxotere and Cytoxan.  She had a port placement recently without incident.  Currently, she is anxious but otherwise feels well. She continues to receive dialysis on Mondays, Wednesdays, and Fridays. She has no neurologic complaints. She denies any recent fevers. She is a good appetite and denies weight loss. She has no chest pain or shortness of breath. She denies any nausea, vomiting, constipation, or diarrhea. Patient otherwise feels well and offers no further specific complaints.  REVIEW OF SYSTEMS:   Review of Systems  Constitutional: Negative.  Negative for fever, malaise/fatigue and weight loss.  Respiratory: Negative.  Negative for cough and shortness of breath.   Cardiovascular: Negative.  Negative for chest pain.  Gastrointestinal: Negative.  Negative for abdominal pain.  Genitourinary: Negative.   Musculoskeletal: Negative.   Neurological: Negative.  Negative for weakness.  Psychiatric/Behavioral: The patient is nervous/anxious.     As per HPI. Otherwise, a complete review of systems is negatve.  PAST MEDICAL HISTORY: Past Medical History:  Diagnosis Date  . Anemia   . Anxiety   . Breast cancer ([place])   . CHF (congestive heart failure) ([place])   . Chronic kidney disease   . Depression   . Dialysis patient ([place])   . DVT (deep venous thrombosis) ([facility])    left leg  . DVT (deep venous thrombosis) ([place])   . Gout   . HTN (hypertension)   . Hypertension   . Parathyroid  abnormality ([place])   . Parathyroid disease ([place])   . Psoriasis     PAST SURGICAL HISTORY: Past Surgical History:  Procedure Laterality Date  . ABDOMINAL HYSTERECTOMY    . APPENDECTOMY    . BREAST BIOPSY Left 10/28/2013   benign  . BREAST EXCISIONAL BIOPSY Left 2002   benign  . INSERTION OF DIALYSIS CATHETER  2014  . PARTIAL HYSTERECTOMY    . PORTACATH PLACEMENT Left 08/21/2015   Procedure: INSERTION PORT-A-CATH;  Surgeon: Hubbard Robinson, MD;  Location: [facility] ORS;  Service: General;  Laterality: Left;    FAMILY HISTORY: Family History  Problem Relation Age of Onset  . Stroke Mother   . CVA Mother   . Hypertension Mother   . Hypertension Father   . Hypertension Sister   . Diabetes Sister   . Cancer Sister 1    Breast  . Stroke Brother   . Hypertension Brother   . Breast cancer Maternal Aunt 57       ADVANCED DIRECTIVES:    HEALTH MAINTENANCE: Social History  Substance Use Topics  . Smoking status: Never Smoker  . Smokeless tobacco: Never Used  . Alcohol use No     Colonoscopy:  PAP:  Bone density:  Lipid panel:  Allergies  Allergen Reactions  . Adhesive [Tape] Itching    Paper tape is ok to use.    No current facility-administered medications for this visit.    No current outpatient prescriptions on file.   Facility-Administered Medications Ordered in Other Visits  Medication Dose Route Frequency Provider Last Rate Last Dose  . acetaminophen (TYLENOL)  tablet 650 mg  650 mg Oral Q6H PRN Saundra Shelling, MD   650 mg at 08/27/15 1412   Or  . acetaminophen (TYLENOL) suppository 650 mg  650 mg Rectal Q6H PRN Saundra Shelling, MD      . cinacalcet (SENSIPAR) tablet 120 mg  120 mg Oral Q breakfast Pavan Pyreddy, MD      . clonazePAM (KLONOPIN) tablet 1 mg  1 mg Oral Daily Fritzi Mandes, MD   1 mg at 08/27/15 1126  . HYDROcodone-acetaminophen (NORCO/VICODIN) 5-325 MG per tablet 1-2 tablet  1-2 tablet Oral Q4H PRN Saundra Shelling, MD   1 tablet at 08/27/15 1408  .  losartan (COZAAR) tablet 100 mg  100 mg Oral Q T,Th,S,Su Pavan Pyreddy, MD      . metoprolol (LOPRESSOR) tablet 50 mg  50 mg Oral 2 times per day on Sun Tue Thu Sat Saundra Shelling, MD   50 mg at 08/27/15 2202  . ondansetron (ZOFRAN) tablet 4 mg  4 mg Oral Q6H PRN Saundra Shelling, MD       Or  . ondansetron (ZOFRAN) injection 4 mg  4 mg Intravenous Q6H PRN Pavan Pyreddy, MD      . pantoprazole (PROTONIX) EC tablet 40 mg  40 mg Oral Daily Saundra Shelling, MD   40 mg at 08/27/15 0931  . piperacillin-tazobactam (ZOSYN) IVPB 3.375 g  3.375 g Intravenous Q12H Fritzi Mandes, MD   3.375 g at 08/27/15 1836  . prochlorperazine (COMPAZINE) tablet 10 mg  10 mg Oral Q6H PRN Pavan Pyreddy, MD      . senna-docusate (Senokot-S) tablet 1 tablet  1 tablet Oral QHS PRN Saundra Shelling, MD      . sertraline (ZOLOFT) tablet 100 mg  100 mg Oral QHS Saundra Shelling, MD   100 mg at 08/27/15 2202  . sevelamer carbonate (RENVELA) tablet 2,400 mg  2,400 mg Oral TID WC Saundra Shelling, MD   2,400 mg at 08/27/15 1722  . [START ON 08/28/2015] vancomycin (VANCOCIN) IVPB 750 mg/150 ml premix  750 mg Intravenous Q M,W,F-HD Pavan Pyreddy, MD      . warfarin (COUMADIN) tablet 5 mg  5 mg Oral q1800 Saundra Shelling, MD   5 mg at 08/27/15 1722  . Warfarin - Physician Dosing Inpatient   Does not apply q1800 Pavan Pyreddy, MD      . zolpidem (AMBIEN) tablet 5 mg  5 mg Oral QHS PRN Saundra Shelling, MD        OBJECTIVE: Vitals:   08/24/15 1005  BP: (!) 157/98  Pulse: 80  Temp: 97.9 F (36.6 C)     Body mass index is 24.66 kg/m.    ECOG FS:0 - Asymptomatic  General: Well-developed, well-nourished, no acute distress. Eyes: Pink conjunctiva, anicteric sclera. Breasts: Patient refused exam today. Lungs: Clear to auscultation bilaterally. Heart: Regular rate and rhythm. No rubs, murmurs, or gallops. Abdomen: Soft, nontender, nondistended. No organomegaly noted, normoactive bowel sounds. Musculoskeletal: No edema, cyanosis, or clubbing. Dialysis  catheter in right chest wall. Neuro: Alert, answering all questions appropriately. Cranial nerves grossly intact. Skin: No rashes or petechiae noted. Psych: Normal affect.   LAB RESULTS:  Lab Results  Component Value Date   NA 137 08/27/2015   K 5.2 (H) 08/27/2015   CL 105 08/27/2015   CO2 21 (L) 08/27/2015   GLUCOSE 108 (H) 08/27/2015   BUN 52 (H) 08/27/2015   CREATININE 7.75 (H) 08/27/2015   CALCIUM 8.0 (L) 08/27/2015   PROT 6.4 (L) 08/27/2015  ALBUMIN 3.3 (L) 08/27/2015   AST 20 08/27/2015   ALT 6 (L) 08/27/2015   ALKPHOS 569 (H) 08/27/2015   BILITOT 0.3 08/27/2015   GFRNONAA 5 (L) 08/27/2015   GFRAA 6 (L) 08/27/2015    Lab Results  Component Value Date   WBC 9.0 08/27/2015   NEUTROABS 7.8 (H) 08/27/2015   HGB 7.8 (L) 08/27/2015   HCT 23.2 (L) 08/27/2015   MCV 86.0 08/27/2015   PLT 114 (L) 08/27/2015     STUDIES: Dg Chest Port 1 View  Result Date: 08/27/2015 CLINICAL DATA:  Fever and chills EXAM: PORTABLE CHEST 1 VIEW COMPARISON:  Six days ago FINDINGS: Chronic mild cardiomegaly. Stable mediastinal contours. Bilateral central line remain in stable position with tips at the SVC level. There is no edema, consolidation, effusion, or pneumothorax. IMPRESSION: Negative for pneumonia. Electronically Signed   By: Monte Fantasia M.D.   On: 08/27/2015 00:50   Dg Chest Port 1 View  Result Date: 08/21/2015 CLINICAL DATA:  Status post port placement EXAM: PORTABLE CHEST 1 VIEW COMPARISON:  08/08/2015 FINDINGS: Right dialysis catheter is again identified and stable. A new left chest wall port is noted with the catheter tip in the superior aspect of the right atrium. No pneumothorax is noted. The lungs are clear. No bony abnormality is noted. IMPRESSION: Status post port placement without pneumothorax. Electronically Signed   By: Inez Catalina M.D.   On: 08/21/2015 09:32   Dg Chest Port 1 View  Result Date: 08/08/2015 CLINICAL DATA:  Respiratory failure EXAM: PORTABLE CHEST 1  VIEW COMPARISON:  August 07, 2015 FINDINGS: There has been interval clearing of pulmonary edema. Currently no edema or consolidation is evident. Heart is mildly enlarged with pulmonary vascularity within normal limits. There is atherosclerotic calcification in the aorta. Central catheter tip is in the superior vena cava. No pneumothorax. No bone lesions. No adenopathy. IMPRESSION: Currently no edema or consolidation. Edema has cleared compared to 1 day prior. Stable cardiac prominence. There is aortic atherosclerosis. No pneumothorax. Electronically Signed   By: Lowella Grip III M.D.   On: 08/08/2015 07:30   Dg Chest Port 1 View  Result Date: 08/07/2015 CLINICAL DATA:  Respiratory distress.  Chronic renal failure EXAM: PORTABLE CHEST 1 VIEW COMPARISON:  July 11, 2015. FINDINGS: There is generalized interstitial pulmonary edema. There is probable mild alveolar edema in the right base. There is cardiomegaly with mild pulmonary venous hypertension. There is atherosclerotic calcification in the aorta. Central catheter tip is in the superior vena cava near the cavoatrial junction. No pneumothorax. No evident bone lesions. IMPRESSION: Evidence of a degree of congestive heart failure. Suspect alveolar edema in the right base, although early pneumonia in this area is possible. There is aortic atherosclerosis. No pneumothorax. Electronically Signed   By: Lowella Grip III M.D.   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  PLAN:    1. 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 Proceed with cycle 1 of 4 of Taxotere and Cytoxan today. Cytoxan will be dose reduce 50% secondary to ongoing dialysis. Once patient completes her treatment, will refer back to surgery.  Patient may or may not require XRT adjuvantly depending on her final pathology results and whether or not she pursues lumpectomy or mastectomy. Finally, at the conclusion all of patient's  treatments she will benefit from an aromatase inhibitor for total 5 years. Return to clinic in 1 week for laboratory work and to assess her toleration of treatment and then in 3 weeks for consideration of cycle 2. 2. End-stage renal disease: Continue dialysis on Monday, Wednesday, and Fridays. 3. Thrombocytopenia: Mild, monitor.  Approximately 30 minutes was spent in discussion of which greater than 50% was consultation.   Patient expressed understanding and was in agreement with this plan. She also understands that She can call clinic at any time with any questions, concerns, or complaints.   Breast cancer of upper-inner quadrant of left female breast [facility])   Staging form: Breast, AJCC 7th Edition   - Clinical stage from 08/05/2015: Stage IIB (T2, N1, M0) - Signed by [name], MD on 08/05/2015  [name], MD   08/27/2015 10:09 PM

## 2015-08-24 ENCOUNTER — Other Ambulatory Visit: Payer: Self-pay | Admitting: *Deleted

## 2015-08-24 ENCOUNTER — Inpatient Hospital Stay: Payer: Medicare Other

## 2015-08-24 ENCOUNTER — Encounter: Payer: Self-pay | Admitting: *Deleted

## 2015-08-24 ENCOUNTER — Inpatient Hospital Stay (HOSPITAL_BASED_OUTPATIENT_CLINIC_OR_DEPARTMENT_OTHER): Payer: Medicare Other | Admitting: Oncology

## 2015-08-24 VITALS — BP 112/83 | HR 82 | Resp 18

## 2015-08-24 VITALS — BP 157/98 | HR 80 | Temp 97.9°F | Wt 152.8 lb

## 2015-08-24 DIAGNOSIS — Z992 Dependence on renal dialysis: Secondary | ICD-10-CM

## 2015-08-24 DIAGNOSIS — C50212 Malignant neoplasm of upper-inner quadrant of left female breast: Secondary | ICD-10-CM

## 2015-08-24 DIAGNOSIS — I129 Hypertensive chronic kidney disease with stage 1 through stage 4 chronic kidney disease, or unspecified chronic kidney disease: Secondary | ICD-10-CM

## 2015-08-24 DIAGNOSIS — Z170 Estrogen receptor positive status [ER+]: Secondary | ICD-10-CM | POA: Diagnosis not present

## 2015-08-24 DIAGNOSIS — N186 End stage renal disease: Secondary | ICD-10-CM | POA: Diagnosis not present

## 2015-08-24 DIAGNOSIS — Z86718 Personal history of other venous thrombosis and embolism: Secondary | ICD-10-CM | POA: Diagnosis not present

## 2015-08-24 DIAGNOSIS — Z79899 Other long term (current) drug therapy: Secondary | ICD-10-CM

## 2015-08-24 DIAGNOSIS — D631 Anemia in chronic kidney disease: Secondary | ICD-10-CM | POA: Diagnosis not present

## 2015-08-24 DIAGNOSIS — D696 Thrombocytopenia, unspecified: Secondary | ICD-10-CM

## 2015-08-24 DIAGNOSIS — Z5111 Encounter for antineoplastic chemotherapy: Secondary | ICD-10-CM | POA: Diagnosis present

## 2015-08-24 DIAGNOSIS — I509 Heart failure, unspecified: Secondary | ICD-10-CM | POA: Diagnosis not present

## 2015-08-24 LAB — CBC WITH DIFFERENTIAL/PLATELET
Basophils Absolute: 0 10*3/uL (ref 0–0.1)
Basophils Relative: 1 %
Eosinophils Absolute: 0.1 10*3/uL (ref 0–0.7)
Eosinophils Relative: 2 %
HEMATOCRIT: 24 % — AB (ref 35.0–47.0)
HEMOGLOBIN: 8.2 g/dL — AB (ref 12.0–16.0)
LYMPHS ABS: 1.7 10*3/uL (ref 1.0–3.6)
LYMPHS PCT: 27 %
MCH: 29.5 pg (ref 26.0–34.0)
MCHC: 34.3 g/dL (ref 32.0–36.0)
MCV: 86 fL (ref 80.0–100.0)
MONOS PCT: 8 %
Monocytes Absolute: 0.5 10*3/uL (ref 0.2–0.9)
NEUTROS ABS: 3.9 10*3/uL (ref 1.4–6.5)
NEUTROS PCT: 62 %
Platelets: 172 10*3/uL (ref 150–440)
RBC: 2.79 MIL/uL — AB (ref 3.80–5.20)
RDW: 15.2 % — ABNORMAL HIGH (ref 11.5–14.5)
WBC: 6.2 10*3/uL (ref 3.6–11.0)

## 2015-08-24 LAB — COMPREHENSIVE METABOLIC PANEL
ALBUMIN: 3.4 g/dL — AB (ref 3.5–5.0)
ALK PHOS: 663 U/L — AB (ref 38–126)
ALT: 5 U/L — AB (ref 14–54)
ANION GAP: 8 (ref 5–15)
AST: 16 U/L (ref 15–41)
BILIRUBIN TOTAL: 0.4 mg/dL (ref 0.3–1.2)
BUN: 27 mg/dL — AB (ref 6–20)
CO2: 24 mmol/L (ref 22–32)
Calcium: 7.4 mg/dL — ABNORMAL LOW (ref 8.9–10.3)
Chloride: 104 mmol/L (ref 101–111)
Creatinine, Ser: 5.94 mg/dL — ABNORMAL HIGH (ref 0.44–1.00)
GFR calc non Af Amer: 7 mL/min — ABNORMAL LOW (ref >60)
GFR, EST AFRICAN AMERICAN: 8 mL/min — AB (ref >60)
Glucose, Bld: 91 mg/dL (ref 65–99)
Potassium: 4.1 mmol/L (ref 3.5–5.1)
SODIUM: 136 mmol/L (ref 135–145)
Total Protein: 6.7 g/dL (ref 6.5–8.1)

## 2015-08-24 MED ORDER — DEXAMETHASONE SODIUM PHOSPHATE 100 MG/10ML IJ SOLN
10.0000 mg | Freq: Once | INTRAMUSCULAR | Status: AC
  Administered 2015-08-24: 10 mg via INTRAVENOUS
  Filled 2015-08-24: qty 1

## 2015-08-24 MED ORDER — PALONOSETRON HCL INJECTION 0.25 MG/5ML
0.2500 mg | Freq: Once | INTRAVENOUS | Status: AC
  Administered 2015-08-24: 0.25 mg via INTRAVENOUS
  Filled 2015-08-24: qty 5

## 2015-08-24 MED ORDER — PEGFILGRASTIM 6 MG/0.6ML ~~LOC~~ PSKT
6.0000 mg | PREFILLED_SYRINGE | Freq: Once | SUBCUTANEOUS | Status: AC
  Administered 2015-08-24: 6 mg via SUBCUTANEOUS
  Filled 2015-08-24: qty 0.6

## 2015-08-24 MED ORDER — HEPARIN SOD (PORK) LOCK FLUSH 100 UNIT/ML IV SOLN
500.0000 [IU] | Freq: Once | INTRAVENOUS | Status: AC | PRN
  Administered 2015-08-24: 500 [IU]
  Filled 2015-08-24: qty 5

## 2015-08-24 MED ORDER — ONDANSETRON HCL 8 MG PO TABS: 8.0000 mg | ORAL_TABLET | Freq: Three times a day (TID) | ORAL | 2 refills | Status: DC | PRN

## 2015-08-24 MED ORDER — SODIUM CHLORIDE 0.9 % IV SOLN: 1000.0000 mg | Freq: Once | INTRAVENOUS | Status: DC

## 2015-08-24 MED ORDER — PROCHLORPERAZINE MALEATE 10 MG PO TABS: 10.0000 mg | ORAL_TABLET | Freq: Four times a day (QID) | ORAL | 2 refills | Status: DC | PRN

## 2015-08-24 MED ORDER — CYCLOPHOSPHAMIDE CHEMO INJECTION 1 GM
500.0000 mg | Freq: Once | INTRAMUSCULAR | Status: AC
  Administered 2015-08-24: 500 mg via INTRAVENOUS
  Filled 2015-08-24: qty 25

## 2015-08-24 MED ORDER — SODIUM CHLORIDE 0.9 % IV SOLN
Freq: Once | INTRAVENOUS | Status: AC
  Administered 2015-08-24: 11:00:00 via INTRAVENOUS
  Filled 2015-08-24: qty 1000

## 2015-08-24 MED ORDER — DEXTROSE 5 % IV SOLN
75.0000 mg/m2 | Freq: Once | INTRAVENOUS | Status: AC
  Administered 2015-08-24: 130 mg via INTRAVENOUS
  Filled 2015-08-24: qty 13

## 2015-08-24 MED ORDER — SODIUM CHLORIDE 0.9% FLUSH
10.0000 mL | INTRAVENOUS | Status: DC | PRN
  Administered 2015-08-24: 10 mL
  Filled 2015-08-24: qty 10

## 2015-08-24 NOTE — Progress Notes (Signed)
Patient ordered taxotere and cytoxan for breast cancer with cytoxan dose of 600mg /m2. Patient also receives hemodialysis every MWF with a current SCr of 5.94.  Cytoxan dose should be reduced by 50% in patient receiving HD due to removal of 20-50% of the drug. Called and spoke with MD who agreed with dose reduction from 600mg /m2 to 300mg /m2. Orders were changed.   Taxotere is not affected by renal function or dialysis. No dose adjustment needed at this time.

## 2015-08-24 NOTE — Progress Notes (Signed)
  Oncology Nurse Navigator Documentation  Navigator Location: [facility] (08/24/15 1100) Navigator Encounter Type: Treatment (08/24/15 1100)         Treatment Initiated Date: 08/24/15 (08/24/15 1100)   Treatment Phase: First Chemo Tx (08/24/15 1100) Barriers/Navigation Needs:  (other - emotional support) (08/24/15 1100)                Acuity: Level 3 (08/24/15 1100)     Acuity Level 3: Emotional needs (08/24/15 1100)   Time Spent with Patient: 30 (08/24/15 1100)   Talked with patient and her husband today prior to her treatment.  Offered support.  She is to call if she has any questions or needs.

## 2015-08-25 NOTE — Progress Notes (Signed)
  Oncology Nurse Navigator Documentation  Navigator Location: [facility] (08/25/15 1200) Navigator Encounter Type: Telephone;Treatment (08/25/15 1200)           Patient Visit Type: Follow-up (08/25/15 1200) Treatment Phase: First Chemo Tx (08/25/15 1200)                        Acuity Level 3: Emotional needs;Ongoing guidance and education provided throughout treatment (08/25/15 1200)   Time Spent with Patient: 15 (08/25/15 1200)   Phoned patient post initial chemotherapy.  Left message.  Reminded to call [phone] over weekend for physician on call if she has any needs.

## 2015-08-26 ENCOUNTER — Inpatient Hospital Stay
Admission: EM | Admit: 2015-08-26 | Discharge: 2015-08-29 | DRG: 864 | Disposition: A | Discharge status: Home / Self Care | Payer: Medicare Other | Attending: Internal Medicine | Admitting: Internal Medicine

## 2015-08-26 DIAGNOSIS — A419 Sepsis, unspecified organism: Secondary | ICD-10-CM

## 2015-08-26 DIAGNOSIS — Z833 Family history of diabetes mellitus: Secondary | ICD-10-CM

## 2015-08-26 DIAGNOSIS — Z8249 Family history of ischemic heart disease and other diseases of the circulatory system: Secondary | ICD-10-CM

## 2015-08-26 DIAGNOSIS — N2581 Secondary hyperparathyroidism of renal origin: Secondary | ICD-10-CM | POA: Diagnosis present

## 2015-08-26 DIAGNOSIS — D6481 Anemia due to antineoplastic chemotherapy: Secondary | ICD-10-CM | POA: Diagnosis present

## 2015-08-26 DIAGNOSIS — Z79899 Other long term (current) drug therapy: Secondary | ICD-10-CM

## 2015-08-26 DIAGNOSIS — Z86718 Personal history of other venous thrombosis and embolism: Secondary | ICD-10-CM

## 2015-08-26 DIAGNOSIS — I132 Hypertensive heart and chronic kidney disease with heart failure and with stage 5 chronic kidney disease, or end stage renal disease: Secondary | ICD-10-CM | POA: Diagnosis present

## 2015-08-26 DIAGNOSIS — Z7901 Long term (current) use of anticoagulants: Secondary | ICD-10-CM

## 2015-08-26 DIAGNOSIS — M109 Gout, unspecified: Secondary | ICD-10-CM | POA: Diagnosis present

## 2015-08-26 DIAGNOSIS — R502 Drug induced fever: Principal | ICD-10-CM | POA: Diagnosis present

## 2015-08-26 DIAGNOSIS — N186 End stage renal disease: Secondary | ICD-10-CM | POA: Diagnosis present

## 2015-08-26 DIAGNOSIS — T451X5A Adverse effect of antineoplastic and immunosuppressive drugs, initial encounter: Secondary | ICD-10-CM | POA: Diagnosis present

## 2015-08-26 DIAGNOSIS — C50919 Malignant neoplasm of unspecified site of unspecified female breast: Secondary | ICD-10-CM | POA: Diagnosis present

## 2015-08-26 DIAGNOSIS — Z803 Family history of malignant neoplasm of breast: Secondary | ICD-10-CM

## 2015-08-26 DIAGNOSIS — D72819 Decreased white blood cell count, unspecified: Secondary | ICD-10-CM | POA: Diagnosis present

## 2015-08-26 DIAGNOSIS — I509 Heart failure, unspecified: Secondary | ICD-10-CM | POA: Diagnosis present

## 2015-08-26 DIAGNOSIS — Z9889 Other specified postprocedural states: Secondary | ICD-10-CM

## 2015-08-26 DIAGNOSIS — R6883 Chills (without fever): Secondary | ICD-10-CM

## 2015-08-26 DIAGNOSIS — D631 Anemia in chronic kidney disease: Secondary | ICD-10-CM | POA: Diagnosis present

## 2015-08-26 DIAGNOSIS — R6510 Systemic inflammatory response syndrome (SIRS) of non-infectious origin without acute organ dysfunction: Secondary | ICD-10-CM | POA: Diagnosis present

## 2015-08-26 DIAGNOSIS — Z992 Dependence on renal dialysis: Secondary | ICD-10-CM

## 2015-08-26 DIAGNOSIS — T80219A Unspecified infection due to central venous catheter, initial encounter: Secondary | ICD-10-CM

## 2015-08-26 DIAGNOSIS — Z823 Family history of stroke: Secondary | ICD-10-CM

## 2015-08-26 DIAGNOSIS — Z90710 Acquired absence of both cervix and uterus: Secondary | ICD-10-CM

## 2015-08-26 DIAGNOSIS — Z9221 Personal history of antineoplastic chemotherapy: Secondary | ICD-10-CM

## 2015-08-26 HISTORY — DX: Malignant neoplasm of unspecified site of unspecified female breast: C50.919

## 2015-08-26 NOTE — ED Triage Notes (Signed)
Per EMS patient was nauseous and went to bathroom to vomit but she slumped into the floor.  Patient denies LOC or any injury as a result of the fall.  She had a port out in at the cancer center this past Tuesday and tonight began shaking and having nausea.  Pt denies vomiting.  She presents tonight in the ER moaning with intermittent spasms.  Patient doe4s take Ambien at night to help her sleep and she has had her dose tonight PTA.

## 2015-08-27 ENCOUNTER — Encounter: Payer: Self-pay | Admitting: Emergency Medicine

## 2015-08-27 ENCOUNTER — Emergency Department: Payer: Medicare Other

## 2015-08-27 ENCOUNTER — Other Ambulatory Visit: Payer: Self-pay

## 2015-08-27 DIAGNOSIS — R6510 Systemic inflammatory response syndrome (SIRS) of non-infectious origin without acute organ dysfunction: Secondary | ICD-10-CM | POA: Diagnosis present

## 2015-08-27 LAB — CBC WITH DIFFERENTIAL/PLATELET
BASOS ABS: 0 10*3/uL (ref 0–0.1)
BASOS PCT: 0 %
EOS PCT: 1 %
Eosinophils Absolute: 0.1 10*3/uL (ref 0–0.7)
HCT: 28.1 % — ABNORMAL LOW (ref 35.0–47.0)
Hemoglobin: 9.6 g/dL — ABNORMAL LOW (ref 12.0–16.0)
LYMPHS PCT: 9 %
Lymphs Abs: 0.8 10*3/uL — ABNORMAL LOW (ref 1.0–3.6)
MCH: 29.6 pg (ref 26.0–34.0)
MCHC: 34.2 g/dL (ref 32.0–36.0)
MCV: 86.7 fL (ref 80.0–100.0)
Monocytes Absolute: 0.1 10*3/uL — ABNORMAL LOW (ref 0.2–0.9)
Monocytes Relative: 1 %
NEUTROS ABS: 7.8 10*3/uL — AB (ref 1.4–6.5)
Neutrophils Relative %: 89 %
PLATELETS: 134 10*3/uL — AB (ref 150–440)
RBC: 3.24 MIL/uL — AB (ref 3.80–5.20)
RDW: 15.1 % — ABNORMAL HIGH (ref 11.5–14.5)
WBC: 8.8 10*3/uL (ref 3.6–11.0)

## 2015-08-27 LAB — COMPREHENSIVE METABOLIC PANEL
ALT: 6 U/L — ABNORMAL LOW (ref 14–54)
ANION GAP: 11 (ref 5–15)
AST: 20 U/L (ref 15–41)
Albumin: 3.3 g/dL — ABNORMAL LOW (ref 3.5–5.0)
Alkaline Phosphatase: 569 U/L — ABNORMAL HIGH (ref 38–126)
BILIRUBIN TOTAL: 0.3 mg/dL (ref 0.3–1.2)
BUN: 45 mg/dL — AB (ref 6–20)
CALCIUM: 7.4 mg/dL — AB (ref 8.9–10.3)
CO2: 22 mmol/L (ref 22–32)
Chloride: 105 mmol/L (ref 101–111)
Creatinine, Ser: 7.36 mg/dL — ABNORMAL HIGH (ref 0.44–1.00)
GFR calc Af Amer: 6 mL/min — ABNORMAL LOW (ref >60)
GFR, EST NON AFRICAN AMERICAN: 5 mL/min — AB (ref >60)
Glucose, Bld: 108 mg/dL — ABNORMAL HIGH (ref 65–99)
POTASSIUM: 4.5 mmol/L (ref 3.5–5.1)
Sodium: 138 mmol/L (ref 135–145)
TOTAL PROTEIN: 6.4 g/dL — AB (ref 6.5–8.1)

## 2015-08-27 LAB — PROTIME-INR
INR: 1.83
Prothrombin Time: 21.4 seconds — ABNORMAL HIGH (ref 11.4–15.2)

## 2015-08-27 LAB — CBC
HCT: 23.2 % — ABNORMAL LOW (ref 35.0–47.0)
HEMOGLOBIN: 7.8 g/dL — AB (ref 12.0–16.0)
MCH: 29.1 pg (ref 26.0–34.0)
MCHC: 33.8 g/dL (ref 32.0–36.0)
MCV: 86 fL (ref 80.0–100.0)
Platelets: 114 10*3/uL — ABNORMAL LOW (ref 150–440)
RBC: 2.7 MIL/uL — AB (ref 3.80–5.20)
RDW: 15.4 % — ABNORMAL HIGH (ref 11.5–14.5)
WBC: 9 10*3/uL (ref 3.6–11.0)

## 2015-08-27 LAB — BASIC METABOLIC PANEL
ANION GAP: 11 (ref 5–15)
BUN: 52 mg/dL — ABNORMAL HIGH (ref 6–20)
CHLORIDE: 105 mmol/L (ref 101–111)
CO2: 21 mmol/L — AB (ref 22–32)
Calcium: 8 mg/dL — ABNORMAL LOW (ref 8.9–10.3)
Creatinine, Ser: 7.75 mg/dL — ABNORMAL HIGH (ref 0.44–1.00)
GFR calc non Af Amer: 5 mL/min — ABNORMAL LOW (ref >60)
GFR, EST AFRICAN AMERICAN: 6 mL/min — AB (ref >60)
Glucose, Bld: 108 mg/dL — ABNORMAL HIGH (ref 65–99)
Potassium: 5.2 mmol/L — ABNORMAL HIGH (ref 3.5–5.1)
Sodium: 137 mmol/L (ref 135–145)

## 2015-08-27 LAB — LACTIC ACID, PLASMA
LACTIC ACID, VENOUS: 1 mmol/L (ref 0.5–1.9)
LACTIC ACID, VENOUS: 1.8 mmol/L (ref 0.5–1.9)

## 2015-08-27 LAB — URINALYSIS COMPLETE WITH MICROSCOPIC (ARMC ONLY)
BACTERIA UA: NONE SEEN
BILIRUBIN URINE: NEGATIVE
GLUCOSE, UA: NEGATIVE mg/dL
Hgb urine dipstick: NEGATIVE
KETONES UR: NEGATIVE mg/dL
LEUKOCYTES UA: NEGATIVE
Nitrite: NEGATIVE
PH: 7 (ref 5.0–8.0)
Protein, ur: 100 mg/dL — AB
RBC / HPF: NONE SEEN RBC/hpf (ref 0–5)
SQUAMOUS EPITHELIAL / LPF: NONE SEEN
Specific Gravity, Urine: 1.01 (ref 1.005–1.030)

## 2015-08-27 LAB — PROCALCITONIN: PROCALCITONIN: 1.67 ng/mL

## 2015-08-27 LAB — MRSA PCR SCREENING: MRSA by PCR: NEGATIVE

## 2015-08-27 LAB — TROPONIN I: TROPONIN I: 0.03 ng/mL — AB (ref <0.03)

## 2015-08-27 MED ORDER — CINACALCET HCL 30 MG PO TABS
120.0000 mg | ORAL_TABLET | Freq: Every day | ORAL | Status: DC
  Administered 2015-08-28: 120 mg via ORAL
  Filled 2015-08-27 (×2): qty 4

## 2015-08-27 MED ORDER — WARFARIN SODIUM 5 MG PO TABS
5.0000 mg | ORAL_TABLET | Freq: Every day | ORAL | Status: DC
  Administered 2015-08-27: 17:00:00 5 mg via ORAL
  Filled 2015-08-27: qty 1

## 2015-08-27 MED ORDER — ONDANSETRON HCL 4 MG PO TABS: 8.0000 mg | ORAL_TABLET | Freq: Three times a day (TID) | ORAL | Status: DC | PRN

## 2015-08-27 MED ORDER — METOPROLOL TARTRATE 50 MG PO TABS
50.0000 mg | ORAL_TABLET | ORAL | Status: DC
  Administered 2015-08-27 – 2015-08-29 (×2): 50 mg via ORAL
  Filled 2015-08-27 (×2): qty 1

## 2015-08-27 MED ORDER — DEXTROSE 5 % IV SOLN
2.0000 g | Freq: Once | INTRAVENOUS | Status: AC
  Administered 2015-08-27: 2 g via INTRAVENOUS
  Filled 2015-08-27: qty 2

## 2015-08-27 MED ORDER — VANCOMYCIN HCL 500 MG IV SOLR
500.0000 mg | Freq: Once | INTRAVENOUS | Status: AC
  Administered 2015-08-27: 07:00:00 500 mg via INTRAVENOUS
  Filled 2015-08-27: qty 500

## 2015-08-27 MED ORDER — PROCHLORPERAZINE MALEATE 10 MG PO TABS: 10.0000 mg | ORAL_TABLET | Freq: Four times a day (QID) | ORAL | Status: DC | PRN

## 2015-08-27 MED ORDER — WARFARIN - PHYSICIAN DOSING INPATIENT
Freq: Every day | Status: DC
  Administered 2015-08-27: 17:00:00

## 2015-08-27 MED ORDER — SODIUM CHLORIDE 0.9 % IV SOLN
INTRAVENOUS | Status: DC
Start: 2015-08-27 — End: 2015-08-27
  Administered 2015-08-27: 06:00:00 via INTRAVENOUS

## 2015-08-27 MED ORDER — PANTOPRAZOLE SODIUM 40 MG PO TBEC
40.0000 mg | DELAYED_RELEASE_TABLET | Freq: Every day | ORAL | Status: DC
  Administered 2015-08-27: 10:00:00 40 mg via ORAL
  Filled 2015-08-27: qty 1

## 2015-08-27 MED ORDER — LOSARTAN POTASSIUM 50 MG PO TABS
100.0000 mg | ORAL_TABLET | ORAL | Status: DC
  Administered 2015-08-29: 100 mg via ORAL
  Filled 2015-08-27 (×2): qty 2

## 2015-08-27 MED ORDER — CLONAZEPAM 0.5 MG PO TABS: 0.5000 mg | ORAL_TABLET | Freq: Every day | ORAL | Status: DC

## 2015-08-27 MED ORDER — ALUM & MAG HYDROXIDE-SIMETH 200-200-20 MG/5ML PO SUSP
30.0000 mL | ORAL | Status: DC | PRN
  Administered 2015-08-27 – 2015-08-28 (×2): 30 mL via ORAL
  Filled 2015-08-27 (×2): qty 30

## 2015-08-27 MED ORDER — HYDROCODONE-ACETAMINOPHEN 5-325 MG PO TABS
1.0000 | ORAL_TABLET | ORAL | Status: DC | PRN
  Administered 2015-08-27 – 2015-08-29 (×5): 1 via ORAL
  Filled 2015-08-27 (×5): qty 1

## 2015-08-27 MED ORDER — ACETAMINOPHEN 325 MG PO TABS
650.0000 mg | ORAL_TABLET | Freq: Four times a day (QID) | ORAL | Status: DC | PRN
  Administered 2015-08-27: 14:00:00 650 mg via ORAL
  Filled 2015-08-27: qty 2

## 2015-08-27 MED ORDER — PIPERACILLIN-TAZOBACTAM 3.375 G IVPB 30 MIN
3.3750 g | Freq: Once | INTRAVENOUS | Status: DC
  Administered 2015-08-27: 3.375 g via INTRAVENOUS
  Filled 2015-08-27: qty 50

## 2015-08-27 MED ORDER — VANCOMYCIN HCL IN DEXTROSE 1-5 GM/200ML-% IV SOLN: 1000.0000 mg | Freq: Once | INTRAVENOUS | Status: DC

## 2015-08-27 MED ORDER — VANCOMYCIN HCL IN DEXTROSE 750-5 MG/150ML-% IV SOLN
750.0000 mg | INTRAVENOUS | Status: DC
  Filled 2015-08-27: qty 150

## 2015-08-27 MED ORDER — ONDANSETRON HCL 4 MG PO TABS
4.0000 mg | ORAL_TABLET | Freq: Four times a day (QID) | ORAL | Status: DC | PRN
  Administered 2015-08-28: 4 mg via ORAL
  Filled 2015-08-27: qty 1

## 2015-08-27 MED ORDER — ZOLPIDEM TARTRATE 5 MG PO TABS
5.0000 mg | ORAL_TABLET | Freq: Every evening | ORAL | Status: DC | PRN
  Administered 2015-08-28: 21:00:00 5 mg via ORAL
  Filled 2015-08-27: qty 1

## 2015-08-27 MED ORDER — ACETAMINOPHEN 500 MG PO TABS
1000.0000 mg | ORAL_TABLET | Freq: Once | ORAL | Status: AC
  Administered 2015-08-27: 1000 mg via ORAL
  Filled 2015-08-27: qty 2

## 2015-08-27 MED ORDER — VANCOMYCIN HCL IN DEXTROSE 1-5 GM/200ML-% IV SOLN
1000.0000 mg | Freq: Once | INTRAVENOUS | Status: AC
  Administered 2015-08-27: 1000 mg via INTRAVENOUS
  Filled 2015-08-27: qty 200

## 2015-08-27 MED ORDER — PIPERACILLIN-TAZOBACTAM 3.375 G IVPB
3.3750 g | Freq: Two times a day (BID) | INTRAVENOUS | Status: DC
  Filled 2015-08-27 (×2): qty 50

## 2015-08-27 MED ORDER — ACETAMINOPHEN 650 MG RE SUPP: 650.0000 mg | Freq: Four times a day (QID) | RECTAL | Status: DC | PRN

## 2015-08-27 MED ORDER — CLONAZEPAM 1 MG PO TABS
1.0000 mg | ORAL_TABLET | Freq: Every day | ORAL | Status: DC
  Administered 2015-08-27 – 2015-08-29 (×3): 1 mg via ORAL
  Filled 2015-08-27 (×3): qty 1

## 2015-08-27 MED ORDER — SERTRALINE HCL 100 MG PO TABS
100.0000 mg | ORAL_TABLET | Freq: Every day | ORAL | Status: DC
  Administered 2015-08-27 – 2015-08-28 (×2): 100 mg via ORAL
  Filled 2015-08-27 (×2): qty 1

## 2015-08-27 MED ORDER — SENNOSIDES-DOCUSATE SODIUM 8.6-50 MG PO TABS: 1.0000 | ORAL_TABLET | Freq: Every evening | ORAL | Status: DC | PRN

## 2015-08-27 MED ORDER — PIPERACILLIN-TAZOBACTAM 3.375 G IVPB
3.3750 g | Freq: Two times a day (BID) | INTRAVENOUS | Status: DC
  Administered 2015-08-27 – 2015-08-29 (×4): 3.375 g via INTRAVENOUS
  Filled 2015-08-27 (×5): qty 50

## 2015-08-27 MED ORDER — ONDANSETRON HCL 4 MG/2ML IJ SOLN
4.0000 mg | Freq: Four times a day (QID) | INTRAMUSCULAR | Status: DC | PRN
  Administered 2015-08-27 – 2015-08-29 (×2): 4 mg via INTRAVENOUS
  Filled 2015-08-27 (×2): qty 2

## 2015-08-27 MED ORDER — SEVELAMER CARBONATE 800 MG PO TABS
2400.0000 mg | ORAL_TABLET | Freq: Three times a day (TID) | ORAL | Status: DC
  Administered 2015-08-27 – 2015-08-28 (×2): 2400 mg via ORAL
  Filled 2015-08-27 (×5): qty 3

## 2015-08-27 NOTE — Plan of Care (Signed)
Problem: Pain Managment: Goal: General experience of comfort will improve Outcome: Progressing Norco given once for bilateral leg pain, back and head good effect.  Problem: Physical Regulation: Goal: Ability to maintain clinical measurements within normal limits will improve Outcome: Progressing Pt had fever 101.1, tylenol given with improvement.  Problem: Fluid Volume: Goal: Ability to maintain a balanced intake and output will improve Outcome: Progressing Fluid restrictions 1291ml. Pt has poor appetite and poor fluid intake. Pt is aware of the restrictions and encouraged to eat and to drink some.  Problem: Nutrition: Goal: Adequate nutrition will be maintained Outcome: Progressing As above

## 2015-08-27 NOTE — Progress Notes (Signed)
ANTIBIOTIC CONSULT NOTE - INITIAL  Pharmacy Consult for Vancomycin , Zosyn  Indication: sepsis  Allergies  Allergen Reactions  . Adhesive [Tape] Itching    Paper tape is ok to use.    Patient Measurements: Height: 5\' 6"  (167.6 cm) Weight: 153 lb 11.2 oz (69.7 kg) IBW/kg (Calculated) : 59.3 Adjusted Body Weight: 63.5 kg   Vital Signs: Temp: 99 F (37.2 C) (08/20 0536) Temp Source: Oral (08/20 0536) BP: 114/64 (08/20 0536) Pulse Rate: 84 (08/20 0536) Intake/Output from previous day: 08/19 0701 - 08/20 0700 In: 250 [IV Piggyback:250] Out: 100 [Urine:100] Intake/Output from this shift: Total I/O In: 250 [IV Piggyback:250] Out: 100 [Urine:100]  Labs:  Recent Labs  08/24/15 0932 08/27/15 0007  WBC 6.2 8.8  HGB 8.2* 9.6*  PLT 172 134*  CREATININE 5.94* 7.36*   Estimated Creatinine Clearance: 7.5 mL/min (by C-G formula based on SCr of 7.36 mg/dL). No results for input(s): VANCOTROUGH, VANCOPEAK, VANCORANDOM, GENTTROUGH, GENTPEAK, GENTRANDOM, TOBRATROUGH, TOBRAPEAK, TOBRARND, AMIKACINPEAK, AMIKACINTROU, AMIKACIN in the last 72 hours.   Microbiology: No results found for this or any previous visit (from the past 720 hour(s)).  Medical History: Past Medical History:  Diagnosis Date  . Anemia   . Anxiety   . Breast cancer ([place])   . CHF (congestive heart failure) ([place])   . Chronic kidney disease   . Depression   . Dialysis patient ([place])   . DVT (deep venous thrombosis) ([facility])    left leg  . DVT (deep venous thrombosis) ([place])   . Gout   . HTN (hypertension)   . Hypertension   . Parathyroid abnormality ([place])   . Parathyroid disease ([place])   . Psoriasis     Medications:  Prescriptions Prior to Admission  Medication Sig Dispense Refill Last Dose  . clonazePAM (KLONOPIN) 1 MG tablet Take 0.5-1 mg by mouth daily.    08/26/2015 at Unknown time  . HYDROcodone-acetaminophen (NORCO/VICODIN) 5-325 MG tablet Take 1-2 tablets by mouth every 4 (four) hours as needed for  moderate pain or severe pain. 20 tablet 0 Past Week at Unknown time  . lidocaine-prilocaine (EMLA) cream Apply 1 application topically as needed. 30 g 2 Past Week at Unknown time  . losartan (COZAAR) 50 MG tablet Take 100 mg by mouth daily. Takes on non-dialysis days=Tuesday, Thursday, Saturday and Sunday.   08/26/2015 at Unknown time  . metoprolol (LOPRESSOR) 100 MG tablet Take 50 mg by mouth 2 (two) times daily. Takes on non-dialysis days=Tuesday, Thursday, Saturday and Sunday.   08/26/2015 at Unknown time  . ondansetron (ZOFRAN) 8 MG tablet Take 1 tablet (8 mg total) by mouth every 8 (eight) hours as needed for nausea or vomiting. 30 tablet 2 Past Week at Unknown time  . pantoprazole (PROTONIX) 20 MG tablet Take 20 mg by mouth daily.    08/26/2015 at Unknown time  . prochlorperazine (COMPAZINE) 10 MG tablet Take 1 tablet (10 mg total) by mouth every 6 (six) hours as needed for nausea or vomiting. 30 tablet 2 Past Week at Unknown time  . SENSIPAR 60 MG tablet Take 120 mg by mouth daily.    08/26/2015 at Unknown time  . sertraline (ZOLOFT) 100 MG tablet Take 100 mg by mouth at bedtime.    08/26/2015 at Unknown time  . sevelamer carbonate (RENVELA) 800 MG tablet Take 2,400 mg by mouth 3 (three) times daily with meals.    08/26/2015 at Unknown time  . traMADol (ULTRAM) 50 MG tablet Take 50 mg by mouth every  6 (six) hours as needed.    08/26/2015 at Unknown time  . warfarin (COUMADIN) 5 MG tablet Take 1 tablet Friday, Saturday and Sundays. Take 1 1/2 tablets the other days of the week   08/26/2015 at Unknown time  . zolpidem (AMBIEN) 10 MG tablet Take 10 mg by mouth at bedtime as needed for sleep.   08/26/2015 at Unknown time   Assessment: Pharmacy consulted to dose vancomycin and zosyn in this 60 year old female admitted with sepsis, on HD at home on MWF.   Goal of Therapy:  Vancomycin trough :  10 - 20 mcg/mL   Plan:  Expected duration 7 days with resolution of temperature and/or normalization of WBC    Zosyn 3.375 gm IV Q12H ordered to start on 8/20.   Vancomycin 1 gm IV X 1 given on 8/20 @ 00:30. Vancomycin 500 mg IV X 1 ordered to be given following intial 1 gm dose to make total loading dose of 1500 mg.  Vancomycin 750 mg IV Q MWF after each HD session ordered.  Will draw 1st trough 30 minutes before HD session on 8/25.   Neriah Brott D 08/27/2015,6:01 AM

## 2015-08-27 NOTE — ED Notes (Signed)
XR at bedside

## 2015-08-27 NOTE — Progress Notes (Signed)
[facility]  ROUNDING NOTE   Subjective:  Patient well known to from prior admissions. She has history of end-stage renal disease on dialysis MWF. She was recently diagnosed with left breast cancer and was recently started on chemotherapy. She developed fevers and chills at home. She underwent her first chemotherapy on this past Thursday. She completed hemodialysis on Friday. Urine cultures and blood cultures are still pending at the moment.  Objective:  Vital signs in last 24 hours:  Temp:  [98.6 F (37 C)-100.7 F (38.2 C)] 99 F (37.2 C) (08/20 0536) Pulse Rate:  [83-107] 83 (08/20 0919) Resp:  [15-31] 18 (08/20 0919) BP: (97-138)/(58-79) 118/60 (08/20 0935) SpO2:  [95 %-100 %] 95 % (08/20 0919) Weight:  [69.4 kg (153 lb)-69.7 kg (153 lb 11.2 oz)] 69.7 kg (153 lb 11.2 oz) (08/20 0536)  Weight change:  Filed Weights   08/27/15 0023 08/27/15 0536  Weight: 69.4 kg (153 lb) 69.7 kg (153 lb 11.2 oz)    Intake/Output: I/O last 3 completed shifts: In: 250 [IV Piggyback:250] Out: 100 [Urine:100]   Intake/Output this shift:  Total I/O In: 120 [P.O.:120] Out: -   Physical Exam: General: No acute distress  Head: Normocephalic, atraumatic. Moist oral mucosal membranes  Eyes: Anicteric  Neck: Supple, trachea midline  Lungs:  Clear to auscultation, normal effort  Heart: S1S2 no rubs  Abdomen:  Soft, nontender, bowel sounds present  Extremities: trace peripheral edema.  Neurologic: Nonfocal, moving all four extremities  Skin: No lesions  Access: R IJ permcath    Basic Metabolic Panel:  Recent Labs Lab 08/21/15 0646 08/24/15 0932 08/27/15 0007 08/27/15 0553  NA 137 136 138 137  K 4.6 4.1 4.5 5.2*  CL  --  104 105 105  CO2  --  24 22 21*  GLUCOSE 88 91 108* 108*  BUN  --  27* 45* 52*  CREATININE  --  5.94* 7.36* 7.75*  CALCIUM  --  7.4* 7.4* 8.0*    Liver Function Tests:  Recent Labs Lab 08/24/15 0932 08/27/15 0007  AST 16 20  ALT 5* 6*   ALKPHOS 663* 569*  BILITOT 0.4 0.3  PROT 6.7 6.4*  ALBUMIN 3.4* 3.3*   No results for input(s): LIPASE, AMYLASE in the last 168 hours. No results for input(s): AMMONIA in the last 168 hours.  CBC:  Recent Labs Lab 08/21/15 0646 08/24/15 0932 08/27/15 0007 08/27/15 0553  WBC  --  6.2 8.8 9.0  NEUTROABS  --  3.9 7.8*  --   HGB 9.9* 8.2* 9.6* 7.8*  HCT 29.0* 24.0* 28.1* 23.2*  MCV  --  86.0 86.7 86.0  PLT  --  172 134* 114*    Cardiac Enzymes:  Recent Labs Lab 08/27/15 0007  TROPONINI 0.03*    BNP: Invalid input(s): POCBNP  CBG: No results for input(s): GLUCAP in the last 168 hours.  Microbiology: Results for orders placed or performed during the hospital encounter of 08/26/15  MRSA PCR Screening     Status: None   Collection Time: 08/27/15  9:37 AM  Result Value Ref Range Status   MRSA by PCR NEGATIVE NEGATIVE Final    Comment:        The GeneXpert MRSA Assay (FDA approved for NASAL specimens only), is one component of a comprehensive MRSA colonization surveillance program. It is not intended to diagnose MRSA infection nor to guide or monitor treatment for MRSA infections.     Coagulation Studies:  Recent Labs  08/27/15 0007  LABPROT 21.4*  INR 1.83    Urinalysis:  Recent Labs  08/27/15 0200  COLORURINE YELLOW*  LABSPEC 1.010  PHURINE 7.0  GLUCOSEU NEGATIVE  HGBUR NEGATIVE  BILIRUBINUR NEGATIVE  KETONESUR NEGATIVE  PROTEINUR 100*  NITRITE NEGATIVE  LEUKOCYTESUR NEGATIVE      Imaging: Dg Chest Port 1 View  Result Date: 08/27/2015 CLINICAL DATA:  Fever and chills EXAM: PORTABLE CHEST 1 VIEW COMPARISON:  Six days ago FINDINGS: Chronic mild cardiomegaly. Stable mediastinal contours. Bilateral central line remain in stable position with tips at the SVC level. There is no edema, consolidation, effusion, or pneumothorax. IMPRESSION: Negative for pneumonia. Electronically Signed   By: Monte Fantasia M.D.   On: 08/27/2015 00:50      Medications:     . cinacalcet  120 mg Oral Q breakfast  . clonazePAM  1 mg Oral Daily  . losartan  100 mg Oral Q T,Th,S,Su  . metoprolol  50 mg Oral 2 times per day on Sun Tue Thu Sat  . pantoprazole  40 mg Oral Daily  . piperacillin-tazobactam (ZOSYN)  IV  3.375 g Intravenous Q12H  . sertraline  100 mg Oral QHS  . sevelamer carbonate  2,400 mg Oral TID WC  . [START ON 08/28/2015] vancomycin  750 mg Intravenous Q M,W,F-HD  . warfarin  5 mg Oral q1800  . Warfarin - Physician Dosing Inpatient   Does not apply q1800   acetaminophen **OR** acetaminophen, HYDROcodone-acetaminophen, ondansetron **OR** ondansetron (ZOFRAN) IV, prochlorperazine, senna-docusate, zolpidem  Assessment/ Plan:  60 y.o. female ESRD on HD MWF, anemia of chronic kidney disease, secondary hyperparathyroidism, hypertension, multiple episodes of acute pulmonary edema with respiratory failure, hypertension, breast cancer, gout, history of DVT, psoriasis who presented to [facility] with fevers, chills, status post fall.  [facility] nephrology/MWF/[address]  1. ESRD on HD MWF: The patient completed hemodialysis on Friday. No urgent indication for dialysis at the moment. Agree with IV fluid hydration at a low rate with 0.9 normal saline at 50 cc per hour. Patient recently started chemotherapy this past Thursday. We will plan for dialysis again on Monday.  2. Anemia of chronic kidney disease. Hemoglobin currently 7.8. Avoid Epogen given recent diagnosis of breast cancer.  3. Secondary hyperparathyroidism. Check intact PTH and phosphorus with dialysis tomorrow. Continue cinacalcet and Renvela at the current doses.  4. Hypertension.  Blood pressure currently 118/60. Continue losartan and metoprolol.  LOS: 0 Jachai Okazaki 8/20/201711:32 AM

## 2015-08-27 NOTE — Progress Notes (Signed)
Admitted with weakness and low grade fever Recent chemo on Thursday for breast cancer Empiric abx IV vanc and zosyn No source of infection identified

## 2015-08-27 NOTE — Care Management Obs Status (Signed)
[place] NOTIFICATION   Patient Details  Name: Teresa Cook MRN: [id number] Date of Birth: 12/10/1955   Medicare Observation Status Notification Given:  Yes    Ival Bible, RN 08/27/2015, 7:10 PM

## 2015-08-27 NOTE — ED Provider Notes (Signed)
[facility] Emergency Department Provider Note   ____________________________________________   First MD Initiated Contact with Patient 08/26/15 2358     (approximate)  I have reviewed the triage vital signs and the nursing notes.   HISTORY  Chief Complaint Weakness and Fall    HPI Teresa Cook is a 60 y.o. female who comes into the hospital today with cold chills and a fever. The patient reports that the symptoms started a couple of hours ago. Her leg started feeling funny as if they had not tonight. She reports that they were aching so she was rubbing them and started shivering and feeling cold. The patient had a port placed this week for cancer treatment and had her first chemotherapy this week as well. Her port was placed on Tuesday and her first treatment was on Thursday. She has stage II breast cancer and is seen by Dr. Grayland Ormond. Dr. Carrie Mew placed to align. She went to the bathroom and according to EMS she slumped over but did not hit her head or her hips. She reports that she is nauseous with no vomiting. She denies any chest pain but did have some abdominal cramps earlier. The patient has end-stage renal disease on dialysis Monday Wednesday Friday and did have dialysis yesterday. She also reports that she urinates 3 times a day. The patient is here for treatment and evaluation of her symptoms. She did not take any medication for fever at home.   Past Medical History:  Diagnosis Date  . Anemia   . Anxiety   . CHF (congestive heart failure) ([place])   . Chronic kidney disease   . Depression   . Dialysis patient ([place])   . DVT (deep venous thrombosis) (H[facility])    left leg  . DVT (deep venous thrombosis) ([place])   . Gout   . HTN (hypertension)   . Hypertension   . Parathyroid abnormality ([place])   . Parathyroid disease ([place])   . Psoriasis     Patient Active Problem List   Diagnosis Date Noted  . Breast cancer metastasized to axillary lymph node  ([place])   . Breast cancer of upper-inner quadrant of left female breast ([place]) 08/05/2015  . Pulmonary edema 07/09/2015  . Dizziness 03/03/2015  . Seizures ([place]) 03/03/2015  . Spells ([place]) 11/11/2014  . Acute respiratory failure ([place]) 09/19/2014  . Left ventricular dysfunction 09/06/2014  . Acute pulmonary edema ([place]) 08/30/2014  . Respiratory failure ([place]) 08/29/2014  . Respiratory difficulty 08/29/2014  . Chronic systolic heart failure ([place]) 05/16/2014  . Hypertension 05/16/2014  . Renal failure 05/16/2014  . Anxiety 05/16/2014  . Sickle cell trait ([place]) 03/03/2014  . ESRD (end stage renal disease) on dialysis ([place]) 11/02/2013  . Dysgeusia 01/27/2013  . Weight loss 01/27/2013  . Resting tremor 08/13/2012  . Depression 04/30/2012  . 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 Right leg DVT ([place]) 04/30/2012    Past Surgical History:  Procedure Laterality Date  . ABDOMINAL HYSTERECTOMY    . APPENDECTOMY    . BREAST BIOPSY Left 10/28/2013   benign  . BREAST EXCISIONAL BIOPSY Left 2002   benign  . INSERTION OF DIALYSIS CATHETER  2014  . PARTIAL HYSTERECTOMY    . PORTACATH PLACEMENT Left 08/21/2015   Procedure: INSERTION PORT-A-CATH;  Surgeon: Hubbard Robinson, MD;  Location: [facility] ORS;  Service: General;  Laterality: Left;    Prior to Admission medications   Medication Sig Start Date End Date Taking? Authorizing Provider  clonazePAM Bobbye [place]) 1  MG tablet Take 0.5-1 mg by mouth daily.  08/23/15  Yes Historical Provider, MD  HYDROcodone-acetaminophen (NORCO/VICODIN) 5-325 MG tablet Take 1-2 tablets by mouth every 4 (four) hours as needed for moderate pain or severe pain. 08/21/15  Yes Hubbard Robinson, MD  lidocaine-prilocaine (EMLA) cream Apply 1 application topically as needed. 08/22/15  Yes [name], MD  losartan (COZAAR) 50 MG tablet Take 100 mg by mouth daily. Takes on non-dialysis days=Tuesday, Thursday, Saturday and Sunday. 09/07/14  Yes Historical  Provider, MD  metoprolol (LOPRESSOR) 100 MG tablet Take 50 mg by mouth 2 (two) times daily. Takes on non-dialysis days=Tuesday, Thursday, Saturday and Sunday.   Yes Historical Provider, MD  ondansetron (ZOFRAN) 8 MG tablet Take 1 tablet (8 mg total) by mouth every 8 (eight) hours as needed for nausea or vomiting. 08/24/15  Yes [name], MD  pantoprazole (PROTONIX) 20 MG tablet Take 20 mg by mouth daily.    Yes Historical Provider, MD  prochlorperazine (COMPAZINE) 10 MG tablet Take 1 tablet (10 mg total) by mouth every 6 (six) hours as needed for nausea or vomiting. 08/24/15  Yes [name], MD  SENSIPAR 60 MG tablet Take 120 mg by mouth daily.  08/24/15  Yes Historical Provider, MD  sertraline (ZOLOFT) 100 MG tablet Take 100 mg by mouth at bedtime.  03/05/12  Yes Historical Provider, MD  sevelamer carbonate (RENVELA) 800 MG tablet Take 2,400 mg by mouth 3 (three) times daily with meals.    Yes Historical Provider, MD  traMADol (ULTRAM) 50 MG tablet Take 50 mg by mouth every 6 (six) hours as needed.  02/20/15  Yes Historical Provider, MD  warfarin (COUMADIN) 5 MG tablet Take 1 tablet Friday, Saturday and Sundays. Take 1 1/2 tablets the other days of the week 11/03/13  Yes Historical Provider, MD  zolpidem (AMBIEN) 10 MG tablet Take 10 mg by mouth at bedtime as needed for sleep.   Yes Historical Provider, MD    Allergies Adhesive [tape]  Family History  Problem Relation Age of Onset  . Stroke Mother   . CVA Mother   . Hypertension Mother   . Hypertension Father   . Hypertension Sister   . Diabetes Sister   . Cancer Sister 51    Breast  . Stroke Brother   . Hypertension Brother   . Breast cancer Maternal Aunt 70    Social History Social History  Substance Use Topics  . Smoking status: Never Smoker  . Smokeless tobacco: Never Used  . Alcohol use No    Review of Systems Constitutional:  fever/chills Eyes: No visual changes. ENT: No sore throat. Cardiovascular:  Denies chest pain. Respiratory: Denies shortness of breath. Gastrointestinal: Abdominal cramping and nausea no vomiting.  No diarrhea.  No constipation. Genitourinary: Negative for dysuria. Musculoskeletal: back pain. Skin: Negative for rash. Neurological: Negative for headaches, focal weakness or numbness.  10-point ROS otherwise negative.  ____________________________________________   PHYSICAL EXAM:  VITAL SIGNS: ED Triage Vitals [08/26/15 2356]  Enc Vitals Group     BP 138/74     Pulse Rate (!) 107     Resp (!) 27     Temp (!) 100.7 F (38.2 C)     Temp Source Oral     SpO2 100 %     Weight      Height      Head Circumference      Peak Flow      Pain Score  Pain Loc      Pain Edu?      Excl. in [place]?     Constitutional: Alert and oriented. Shivering and shaking chills and in Moderate distress. Eyes: Conjunctivae are normal. PERRL. EOMI. Head: Atraumatic. Nose: No congestion/rhinnorhea. Mouth/Throat: Mucous membranes are moist.  Oropharynx non-erythematous. Cardiovascular: Tachycardia, regular rhythm. Grossly normal heart sounds.  Good peripheral circulation. Respiratory: Normal respiratory effort.  No retractions. Lungs CTAB. Gastrointestinal: Soft and nontender. No distention. Positive bowel sounds Musculoskeletal: No lower extremity tenderness nor edema.  Neurologic:  Normal speech and language.  Skin:  Skin is warm, dry and intact.  Psychiatric: Mood and affect are normal.   ____________________________________________   LABS (all labs ordered are listed, but only abnormal results are displayed)  Labs Reviewed  COMPREHENSIVE METABOLIC PANEL - Abnormal; Notable for the following:       Result Value   Glucose, Bld 108 (*)    BUN 45 (*)    Creatinine, Ser 7.36 (*)    Calcium 7.4 (*)    Total Protein 6.4 (*)    Albumin 3.3 (*)    ALT 6 (*)    Alkaline Phosphatase 569 (*)    GFR calc non Af Amer 5 (*)    GFR calc Af Amer 6 (*)    All other  components within normal limits  CBC WITH DIFFERENTIAL/PLATELET - Abnormal; Notable for the following:    RBC 3.24 (*)    Hemoglobin 9.6 (*)    HCT 28.1 (*)    RDW 15.1 (*)    Platelets 134 (*)    Neutro Abs 7.8 (*)    Lymphs Abs 0.8 (*)    Monocytes Absolute 0.1 (*)    All other components within normal limits  TROPONIN I - Abnormal; Notable for the following:    Troponin I 0.03 (*)    All other components within normal limits  URINALYSIS COMPLETEWITH MICROSCOPIC ([facility] ONLY) - Abnormal; Notable for the following:    Color, Urine YELLOW (*)    APPearance CLEAR (*)    Protein, ur 100 (*)    All other components within normal limits  CULTURE, BLOOD (ROUTINE X 2)  CULTURE, BLOOD (ROUTINE X 2)  URINE CULTURE  LACTIC ACID, PLASMA  LACTIC ACID, PLASMA   ____________________________________________  EKG  ED ECG REPORT I, Loney Hering, the attending physician, personally viewed and interpreted this ECG.   Date: 08/27/2015  EKG Time: 0007  Rate: 104  Rhythm: sinus tachycardia  Axis: normal  Intervals:none  ST&T Change: ST depression II, V4, V5, V6  ____________________________________________  RADIOLOGY  Chest x-ray ____________________________________________   PROCEDURES  Procedure(s) performed: None  Procedures  Critical Care performed: Yes, see critical care note(s)  CRITICAL CARE Performed by: Charlesetta Ivory P   Total critical care time: 30 minutes  Critical care time was exclusive of separately billable procedures and treating other patients.  Critical care was necessary to treat or prevent imminent or life-threatening deterioration.  Critical care was time spent personally by me on the following activities: development of treatment plan with patient and/or surrogate as well as nursing, discussions with consultants, evaluation of patient's response to treatment, examination of patient, obtaining history from patient or surrogate, ordering and  performing treatments and interventions, ordering and review of laboratory studies, ordering and review of radiographic studies, pulse oximetry and re-evaluation of patient's condition.  ____________________________________________   INITIAL IMPRESSION / ASSESSMENT AND PLAN / ED COURSE  Pertinent labs & imaging results that  were available during my care of the patient were reviewed by me and considered in my medical decision making (see chart for details).  This is a 60 year old female who comes into the hospital today with what appears to be sepsis. The patient is having a fever as well as shaking chills. The patient had a line placed a few days ago. I will give the patient a 500 normal bolus of normal saline given her renal failure and congestive heart failure. I did initiate the sepsis protocol I will give the patient some cefepime and vancomycin for line sepsis. The patient will be reassessed once I received the results of her blood work.  Clinical Course  Value Comment By Time  DG Chest Port 1 View Negative for pneumonia. Loney Hering, MD 08/20 0115   ED Sepsis - Repeat Assessment   Performed at:    08/27/15 0340  Last Vitals:    Blood pressure 92/52, pulse 94, resp. rate (!) 31, weight 153 lb (69.4 kg), SpO2 98 %.  Heart:                 RRR no murmurs rubs or gallops  Lungs:     CTAB  Capillary Refill:   2+  Peripheral Pulse (include location): Radial 2+   Skin (include color):   normal  Be admitted to the hospitalist service for sepsis. She did receive a dose of vancomycin and cefepime. The patient's tachycardia is improved as well as her respirations. We are still awaiting results of her urinalysis. ____________________________________________   FINAL CLINICAL IMPRESSION(S) / ED DIAGNOSES  Final diagnoses:  Sepsis, due to unspecified organism ([place])  Shaking chills  Central line infection, initial encounter      NEW MEDICATIONS STARTED DURING THIS  VISIT:  New Prescriptions   No medications on file     Note:  This document was prepared using Dragon voice recognition software and may include unintentional dictation errors.    Loney Hering, MD 08/27/15 [phone]

## 2015-08-27 NOTE — H&P (Signed)
[name] [place] NAME: Teresa Cook    MR#:  [id number]  DATE OF BIRTH:  1955-01-16  DATE OF ADMISSION:  08/26/2015  PRIMARY CARE PHYSICIAN: Murlean Iba, MD   REQUESTING/REFERRING PHYSICIAN:   CHIEF COMPLAINT:   Chief Complaint  Patient presents with  . Weakness  . Fall    HISTORY OF PRESENT ILLNESS: Teresa Cook  is a 60 y.o. female with a known history of End-stage renal disease on dialysis, breast cancer on chemotherapy, congestive heart failure, DVT on oral Coumadin, hypertension, gout presented to the emergency room with fever and chills and weakness. Patient recently had a port placed on left side of the chest on Tuesday. She was advised if he she ever had any fever to come to the hospital. She had some fever and chills yesterday. No history of any cough. No complaints of any chest pain. Patient gets dialyzed on Monday Wednesday Friday. She was evaluated in the emergency room and code sepsis was called. Bedside lactate level was normal. Patient had low-grade fever but blood pressure was stable. She was given IV vancomycin and IV cefepime antibiotics in the emergency room and hospitalist service was consulted for further care of the patient. Patient received a chemotherapy on Tuesday. No nausea or vomiting. No abdominal pain.  PAST MEDICAL HISTORY:   Past Medical History:  Diagnosis Date  . Anemia   . Anxiety   . Breast cancer ([place])   . CHF (congestive heart failure) ([place])   . Chronic kidney disease   . Depression   . Dialysis patient ([place])   . DVT (deep venous thrombosis) ([facility])    left leg  . DVT (deep venous thrombosis) ([place])   . Gout   . HTN (hypertension)   . Hypertension   . Parathyroid abnormality ([place])   . Parathyroid disease ([place])   . Psoriasis     PAST SURGICAL HISTORY: Past Surgical History:  Procedure Laterality Date  . ABDOMINAL HYSTERECTOMY    . APPENDECTOMY    . BREAST BIOPSY Left 10/28/2013    benign  . BREAST EXCISIONAL BIOPSY Left 2002   benign  . INSERTION OF DIALYSIS CATHETER  2014  . PARTIAL HYSTERECTOMY    . PORTACATH PLACEMENT Left 08/21/2015   Procedure: INSERTION PORT-A-CATH;  Surgeon: Hubbard Robinson, MD;  Location: [facility] ORS;  Service: General;  Laterality: Left;    SOCIAL HISTORY:  Social History  Substance Use Topics  . Smoking status: Never Smoker  . Smokeless tobacco: Never Used  . Alcohol use No    FAMILY HISTORY:  Family History  Problem Relation Age of Onset  . Stroke Mother   . CVA Mother   . Hypertension Mother   . Hypertension Father   . Hypertension Sister   . Diabetes Sister   . Cancer Sister 42    Breast  . Stroke Brother   . Hypertension Brother   . Breast cancer Maternal Aunt 70    DRUG ALLERGIES:  Allergies  Allergen Reactions  . Adhesive [Tape] Itching    Paper tape is ok to use.    REVIEW OF SYSTEMS:   CONSTITUTIONAL: Low grade fever, has weakness.  EYES: No blurred or double vision.  EARS, NOSE, AND THROAT: No tinnitus or ear pain.  RESPIRATORY: No cough, shortness of breath, wheezing or hemoptysis.  CARDIOVASCULAR: No chest pain, orthopnea, edema.  GASTROINTESTINAL: No nausea, vomiting, diarrhea or abdominal pain.  GENITOURINARY: No dysuria, hematuria.  ENDOCRINE: No polyuria,  nocturia,  HEMATOLOGY: No anemia, easy bruising or bleeding SKIN: No rash or lesion. MUSCULOSKELETAL: No joint pain or arthritis.   NEUROLOGIC: No tingling, numbness, has generalized weakness.  PSYCHIATRY: No anxiety or depression.   MEDICATIONS AT HOME:  Prior to Admission medications   Medication Sig Start Date End Date Taking? Authorizing Provider  clonazePAM (KLONOPIN) 1 MG tablet Take 0.5-1 mg by mouth daily.  08/23/15  Yes Historical Provider, MD  HYDROcodone-acetaminophen (NORCO/VICODIN) 5-325 MG tablet Take 1-2 tablets by mouth every 4 (four) hours as needed for moderate pain or severe pain. 08/21/15  Yes Hubbard Robinson, MD   lidocaine-prilocaine (EMLA) cream Apply 1 application topically as needed. 08/22/15  Yes [name], MD  losartan (COZAAR) 50 MG tablet Take 100 mg by mouth daily. Takes on non-dialysis days=Tuesday, Thursday, Saturday and Sunday. 09/07/14  Yes Historical Provider, MD  metoprolol (LOPRESSOR) 100 MG tablet Take 50 mg by mouth 2 (two) times daily. Takes on non-dialysis days=Tuesday, Thursday, Saturday and Sunday.   Yes Historical Provider, MD  ondansetron (ZOFRAN) 8 MG tablet Take 1 tablet (8 mg total) by mouth every 8 (eight) hours as needed for nausea or vomiting. 08/24/15  Yes [name], MD  pantoprazole (PROTONIX) 20 MG tablet Take 20 mg by mouth daily.    Yes Historical Provider, MD  prochlorperazine (COMPAZINE) 10 MG tablet Take 1 tablet (10 mg total) by mouth every 6 (six) hours as needed for nausea or vomiting. 08/24/15  Yes [name], MD  SENSIPAR 60 MG tablet Take 120 mg by mouth daily.  08/24/15  Yes Historical Provider, MD  sertraline (ZOLOFT) 100 MG tablet Take 100 mg by mouth at bedtime.  03/05/12  Yes Historical Provider, MD  sevelamer carbonate (RENVELA) 800 MG tablet Take 2,400 mg by mouth 3 (three) times daily with meals.    Yes Historical Provider, MD  traMADol (ULTRAM) 50 MG tablet Take 50 mg by mouth every 6 (six) hours as needed.  02/20/15  Yes Historical Provider, MD  warfarin (COUMADIN) 5 MG tablet Take 1 tablet Friday, Saturday and Sundays. Take 1 1/2 tablets the other days of the week 11/03/13  Yes Historical Provider, MD  zolpidem (AMBIEN) 10 MG tablet Take 10 mg by mouth at bedtime as needed for sleep.   Yes Historical Provider, MD      PHYSICAL EXAMINATION:   VITAL SIGNS: Blood pressure 125/79, pulse (!) 106, temperature 98.6 F (37 C), temperature source Oral, resp. rate (!) 31, weight 69.4 kg (153 lb), SpO2 98 %.  GENERAL:  60 y.o.-year-old patient lying in the bed with no acute distress.  EYES: Pupils equal, round, reactive to light and  accommodation. No scleral icterus. Extraocular muscles intact.  HEENT: Head atraumatic, normocephalic. Oropharynx dry and nasopharynx clear.  NECK:  Supple, no jugular venous distention. No thyroid enlargement, no tenderness.  LUNGS: Normal breath sounds bilaterally, no wheezing, rales,rhonchi or crepitation. No use of accessory muscles of respiration.  Has port on left side of chest CARDIOVASCULAR: S1, S2 normal. No murmurs, rubs, or gallops.  ABDOMEN: Soft, nontender, nondistended. Bowel sounds present. No organomegaly or mass.  EXTREMITIES: No pedal edema, cyanosis, or clubbing.  NEUROLOGIC: Cranial nerves II through XII are intact. Muscle strength 5/5 in all extremities. Sensation intact. Gait not checked.  PSYCHIATRIC: The patient is alert and oriented x 3.  SKIN: No obvious rash, lesion, or ulcer.   LABORATORY PANEL:   CBC  Recent Labs Lab 08/21/15 0646 08/24/15 0932 08/27/15 0007  WBC  --  6.2 8.8  HGB 9.9* 8.2* 9.6*  HCT 29.0* 24.0* 28.1*  PLT  --  172 134*  MCV  --  86.0 86.7  MCH  --  29.5 29.6  MCHC  --  34.3 34.2  RDW  --  15.2* 15.1*  LYMPHSABS  --  1.7 0.8*  MONOABS  --  0.5 0.1*  EOSABS  --  0.1 0.1  BASOSABS  --  0.0 0.0   ------------------------------------------------------------------------------------------------------------------  Chemistries   Recent Labs Lab 08/21/15 0646 08/24/15 0932 08/27/15 0007  NA 137 136 138  K 4.6 4.1 4.5  CL  --  104 105  CO2  --  24 22  GLUCOSE 88 91 108*  BUN  --  27* 45*  CREATININE  --  5.94* 7.36*  CALCIUM  --  7.4* 7.4*  AST  --  16 20  ALT  --  5* 6*  ALKPHOS  --  663* 569*  BILITOT  --  0.4 0.3   ------------------------------------------------------------------------------------------------------------------ estimated creatinine clearance is 7.5 mL/min (by C-G formula based on SCr of 7.36  mg/dL). ------------------------------------------------------------------------------------------------------------------ No results for input(s): TSH, T4TOTAL, T3FREE, THYROIDAB in the last 72 hours.  Invalid input(s): FREET3   Coagulation profile No results for input(s): INR, PROTIME in the last 168 hours. ------------------------------------------------------------------------------------------------------------------- No results for input(s): DDIMER in the last 72 hours. -------------------------------------------------------------------------------------------------------------------  Cardiac Enzymes  Recent Labs Lab 08/27/15 0007  TROPONINI 0.03*   ------------------------------------------------------------------------------------------------------------------ Invalid input(s): POCBNP  ---------------------------------------------------------------------------------------------------------------  Urinalysis    Component Value Date/Time   COLORURINE YELLOW (A) 08/27/2015 0200   APPEARANCEUR CLEAR (A) 08/27/2015 0200   APPEARANCEUR Clear 09/08/2013 0133   LABSPEC 1.010 08/27/2015 0200   LABSPEC 1.013 09/08/2013 0133   PHURINE 7.0 08/27/2015 0200   GLUCOSEU NEGATIVE 08/27/2015 0200   GLUCOSEU 50 mg/dL 09/08/2013 0133   HGBUR NEGATIVE 08/27/2015 0200   BILIRUBINUR NEGATIVE 08/27/2015 0200   BILIRUBINUR Negative 09/08/2013 0133   KETONESUR NEGATIVE 08/27/2015 0200   PROTEINUR 100 (A) 08/27/2015 0200   NITRITE NEGATIVE 08/27/2015 0200   LEUKOCYTESUR NEGATIVE 08/27/2015 0200   LEUKOCYTESUR Negative 09/08/2013 0133     RADIOLOGY: Dg Chest Port 1 View  Result Date: 08/27/2015 CLINICAL DATA:  Fever and chills EXAM: PORTABLE CHEST 1 VIEW COMPARISON:  Six days ago FINDINGS: Chronic mild cardiomegaly. Stable mediastinal contours. Bilateral central line remain in stable position with tips at the SVC level. There is no edema, consolidation, effusion, or pneumothorax.  IMPRESSION: Negative for pneumonia. Electronically Signed   By: Monte Fantasia M.D.   On: 08/27/2015 00:50    EKG: Orders placed or performed during the hospital encounter of 08/26/15  . ED EKG 12-Lead  . ED EKG 12-Lead    IMPRESSION AND PLAN: 60 year old female patient with history of end-stage renal disease on dialysis, breast cancer on chemotherapy, congestive heart failure, hypertension, gout presented to the emergency room with fever and chills. Admitting diagnosis 1. Systemic inflammatory response syndrome 2. End-stage renal disease on dialysis 3. Breast cancer 4. Hypertension Treatment plan Admit patient to medical floor observation bed Follow-up cultures Lactate level normal Gentle IV fluid hydration Nephrology consult for dialysis Empirical IV antibiotics with IV vancomycin and IV Zosyn Follow-up renal function DVT prophylaxis patient on Coumadin for anticoagulation as outpatient, will continue it. Follow-up PT/INR Supportive care.  All the records are reviewed and case discussed with ED provider. Management plans discussed with the patient, family and they are in agreement.  CODE STATUS:FULL    Code  Status Orders        Start     Ordered   08/27/15 0406  Full code  Continuous     08/27/15 0406    Code Status History    Date Active Date Inactive Code Status Order ID Comments User Context   07/09/2015  2:44 PM 07/12/2015  7:55 PM Full Code [id number]  Laverle Hobby, MD ED   09/19/2014 11:47 AM 09/20/2014  6:25 PM Full Code [id number]  Dustin Flock, MD ED   08/29/2014  2:28 PM 08/30/2014  5:43 PM Full Code [id number]  Fritzi Mandes, MD Inpatient    Advance Directive Documentation   Flowsheet Row Most Recent Value  Type of Advance Directive  Healthcare Power of Attorney  Pre-existing out of facility DNR order (yellow form or pink MOST form)  No data  "MOST" Form in Place?  No data       TOTAL TIME TAKING CARE OF THIS PATIENT: 53 minutes.    Saundra Shelling  M.D on 08/27/2015 at 4:08 AM  Between 7am to 6pm - Pager - [phone]  After 6pm go to [email or web address] - password [email or web address] [facility]  Office  [phone]  CC: Primary care physician; Murlean Iba, MD

## 2015-08-28 DIAGNOSIS — R502 Drug induced fever: Secondary | ICD-10-CM | POA: Diagnosis present

## 2015-08-28 DIAGNOSIS — D631 Anemia in chronic kidney disease: Secondary | ICD-10-CM | POA: Diagnosis present

## 2015-08-28 DIAGNOSIS — I509 Heart failure, unspecified: Secondary | ICD-10-CM | POA: Diagnosis present

## 2015-08-28 DIAGNOSIS — C50919 Malignant neoplasm of unspecified site of unspecified female breast: Secondary | ICD-10-CM | POA: Diagnosis present

## 2015-08-28 DIAGNOSIS — Z8249 Family history of ischemic heart disease and other diseases of the circulatory system: Secondary | ICD-10-CM | POA: Diagnosis not present

## 2015-08-28 DIAGNOSIS — T451X5A Adverse effect of antineoplastic and immunosuppressive drugs, initial encounter: Secondary | ICD-10-CM | POA: Diagnosis present

## 2015-08-28 DIAGNOSIS — Z9221 Personal history of antineoplastic chemotherapy: Secondary | ICD-10-CM | POA: Diagnosis not present

## 2015-08-28 DIAGNOSIS — N186 End stage renal disease: Secondary | ICD-10-CM | POA: Diagnosis present

## 2015-08-28 DIAGNOSIS — Z7901 Long term (current) use of anticoagulants: Secondary | ICD-10-CM | POA: Diagnosis not present

## 2015-08-28 DIAGNOSIS — Z79899 Other long term (current) drug therapy: Secondary | ICD-10-CM | POA: Diagnosis not present

## 2015-08-28 DIAGNOSIS — Z992 Dependence on renal dialysis: Secondary | ICD-10-CM | POA: Diagnosis not present

## 2015-08-28 DIAGNOSIS — I132 Hypertensive heart and chronic kidney disease with heart failure and with stage 5 chronic kidney disease, or end stage renal disease: Secondary | ICD-10-CM | POA: Diagnosis present

## 2015-08-28 DIAGNOSIS — Z823 Family history of stroke: Secondary | ICD-10-CM | POA: Diagnosis not present

## 2015-08-28 DIAGNOSIS — Z86718 Personal history of other venous thrombosis and embolism: Secondary | ICD-10-CM | POA: Diagnosis not present

## 2015-08-28 DIAGNOSIS — D72819 Decreased white blood cell count, unspecified: Secondary | ICD-10-CM | POA: Diagnosis present

## 2015-08-28 DIAGNOSIS — Z833 Family history of diabetes mellitus: Secondary | ICD-10-CM | POA: Diagnosis not present

## 2015-08-28 DIAGNOSIS — Z90710 Acquired absence of both cervix and uterus: Secondary | ICD-10-CM | POA: Diagnosis not present

## 2015-08-28 DIAGNOSIS — Z9889 Other specified postprocedural states: Secondary | ICD-10-CM | POA: Diagnosis not present

## 2015-08-28 DIAGNOSIS — R6883 Chills (without fever): Secondary | ICD-10-CM | POA: Diagnosis present

## 2015-08-28 DIAGNOSIS — M109 Gout, unspecified: Secondary | ICD-10-CM | POA: Diagnosis present

## 2015-08-28 DIAGNOSIS — D6481 Anemia due to antineoplastic chemotherapy: Secondary | ICD-10-CM | POA: Diagnosis present

## 2015-08-28 DIAGNOSIS — Z803 Family history of malignant neoplasm of breast: Secondary | ICD-10-CM | POA: Diagnosis not present

## 2015-08-28 DIAGNOSIS — N2581 Secondary hyperparathyroidism of renal origin: Secondary | ICD-10-CM | POA: Diagnosis present

## 2015-08-28 LAB — PROTIME-INR
INR: 2.73
PROTHROMBIN TIME: 29.5 s — AB (ref 11.4–15.2)

## 2015-08-28 LAB — URINE CULTURE: Culture: NO GROWTH

## 2015-08-28 MED ORDER — WARFARIN SODIUM 3 MG PO TABS
3.0000 mg | ORAL_TABLET | Freq: Once | ORAL | Status: AC
  Administered 2015-08-28: 3 mg via ORAL
  Filled 2015-08-28: qty 1

## 2015-08-28 MED ORDER — WARFARIN - PHARMACIST DOSING INPATIENT
Freq: Every day | Status: DC
  Administered 2015-08-28: 18:00:00

## 2015-08-28 MED ORDER — PANTOPRAZOLE SODIUM 40 MG PO TBEC
40.0000 mg | DELAYED_RELEASE_TABLET | Freq: Every day | ORAL | Status: DC
  Administered 2015-08-28 – 2015-08-29 (×2): 40 mg via ORAL
  Filled 2015-08-28 (×3): qty 1

## 2015-08-28 NOTE — Progress Notes (Signed)
[facility] NAME: Ciin Obryan    MR#:  [id number]  DATE OF BIRTH:  1955/05/23  SUBJECTIVE:   Came in with increasing weakness, and febrile illness. First chemo given last thursday REVIEW OF SYSTEMS:   Review of Systems  Constitutional: Negative for chills, fever and weight loss.  HENT: Negative for ear discharge, ear pain and nosebleeds.   Eyes: Negative for blurred vision, pain and discharge.  Respiratory: Negative for sputum production, shortness of breath, wheezing and stridor.   Cardiovascular: Negative for chest pain, palpitations, orthopnea and PND.  Gastrointestinal: Negative for abdominal pain, diarrhea, nausea and vomiting.  Genitourinary: Negative for frequency and urgency.  Musculoskeletal: Negative for back pain and joint pain.  Neurological: Positive for weakness. Negative for sensory change, speech change and focal weakness.  Psychiatric/Behavioral: Negative for depression and hallucinations. The patient is not nervous/anxious.    Tolerating Diet:yes Tolerating PT: pending  DRUG ALLERGIES:   Allergies  Allergen Reactions  . Adhesive [Tape] Itching    Paper tape is ok to use.    VITALS:  Blood pressure 118/67, pulse 98, temperature 98.7 F (37.1 C), temperature source Oral, resp. rate (!) 26, height 5\' 6"  (1.676 m), weight 156 lb 12 oz (71.1 kg), SpO2 97 %.  PHYSICAL EXAMINATION:   Physical Exam  GENERAL:  60 y.o.-year-old patient lying in the bed with no acute distress.  EYES: Pupils equal, round, reactive to light and accommodation. No scleral icterus. Extraocular muscles intact.  HEENT: Head atraumatic, normocephalic. Oropharynx and nasopharynx clear.  NECK:  Supple, no jugular venous distention. No thyroid enlargement, no tenderness.  LUNGS: Normal breath sounds bilaterally, no wheezing, rales, rhonchi. No use of accessory muscles of respiration.  CARDIOVASCULAR: S1, S2 normal. No murmurs,  rubs, or gallops.  ABDOMEN: Soft, nontender, nondistended. Bowel sounds present. No organomegaly or mass.  EXTREMITIES: No cyanosis, clubbing or edema b/l.    NEUROLOGIC: Cranial nerves II through XII are intact. No focal Motor or sensory deficits b/l.   PSYCHIATRIC:  patient is alert and oriented x 3.  SKIN: No obvious rash, lesion, or ulcer.   LABORATORY PANEL:  CBC  Recent Labs Lab 08/27/15 0553  WBC 9.0  HGB 7.8*  HCT 23.2*  PLT 114*    Chemistries   Recent Labs Lab 08/27/15 0007 08/27/15 0553  NA 138 137  K 4.5 5.2*  CL 105 105  CO2 22 21*  GLUCOSE 108* 108*  BUN 45* 52*  CREATININE 7.36* 7.75*  CALCIUM 7.4* 8.0*  AST 20  --   ALT 6*  --   ALKPHOS 569*  --   BILITOT 0.3  --    Cardiac Enzymes  Recent Labs Lab 08/27/15 0007  TROPONINI 0.03*   RADIOLOGY:  Dg Chest Port 1 View  Result Date: 08/27/2015 CLINICAL DATA:  Fever and chills EXAM: PORTABLE CHEST 1 VIEW COMPARISON:  Six days ago FINDINGS: Chronic mild cardiomegaly. Stable mediastinal contours. Bilateral central line remain in stable position with tips at the SVC level. There is no edema, consolidation, effusion, or pneumothorax. IMPRESSION: Negative for pneumonia. Electronically Signed   By: Monte Fantasia M.D.   On: 08/27/2015 00:50   ASSESSMENT AND PLAN:  60 year old female patient with history of end-stage renal disease on dialysis, breast cancer on chemotherapy, congestive heart failure, hypertension, gout presented to the emergency room with fever and chills.  1. Systemic inflammatory response syndrome/ febrile illness suspect Chemo induced -so far no source of  fever identified -IV vanc and zosyn -BC neg -CXr neg -Wbc low due to chemo -will monitor fever curve for 24 hours before d/cing abxs  2. End-stage renal disease on dialysis -cont in house HD  3. Breast cancer --finished first cycle of chemo last Thursday  4. Hypertension Cont home meds  PT to see  Case discussed with Care  Management/Social Worker. Management plans discussed with the patient, family and they are in agreement.  CODE STATUS: full  DVT Prophylaxis: heparin  TOTAL TIME TAKING CARE OF THIS PATIENT: 30 minutes.  >50% time spent on counselling and coordination of care  POSSIBLE D/C IN 1-2 DAYS, DEPENDING ON CLINICAL CONDITION.  Note: This dictation was prepared with Dragon dictation along with smaller phrase technology. Any transcriptional errors that result from this process are unintentional.  Rolando Hessling M.D on 08/28/2015 at 11:24 AM  Between 7am to 6pm - Pager - [phone]  After 6pm go to [email or web address] - password [email or web address] [facility]  Office  [phone]  CC: Primary care physician; Murlean Iba, MD

## 2015-08-28 NOTE — Progress Notes (Signed)
Pre Dialysis 

## 2015-08-28 NOTE — Progress Notes (Signed)
ANTICOAGULATION CONSULT NOTE - Initial Consult  Pharmacy Consult for warfarin Indication: DVT (history of)  Allergies  Allergen Reactions  . Adhesive [Tape] Itching    Paper tape is ok to use.    Patient Measurements: Height: 5\' 6"  (167.6 cm) Weight: 156 lb 12 oz (71.1 kg) IBW/kg (Calculated) : 59.3   Vital Signs: Temp: 98.7 F (37.1 C) (08/21 1024) Temp Source: Oral (08/21 1024) BP: 101/60 (08/21 1330) Pulse Rate: 99 (08/21 1330)  Labs:  Recent Labs  08/27/15 0007 08/27/15 0553 08/28/15 0551  HGB 9.6* 7.8*  --   HCT 28.1* 23.2*  --   PLT 134* 114*  --   LABPROT 21.4*  --  29.5*  INR 1.83  --  2.73  CREATININE 7.36* 7.75*  --   TROPONINI 0.03*  --   --     Estimated Creatinine Clearance: 7.1 mL/min (by C-G formula based on SCr of 7.75 mg/dL).   Assessment: 60 yo female on warfarin prior to admission with hx of DVT. Called pt's room - pt states she takes whole pill Fri, Sat, Sun and rest of the days she takes a pill and a half. Pt states 5 mg tab (confirms they are a peachy pink color). Per rounding MD today, pt does not appear fluid overloaded.   8/20 INR 1.83, warfarin 5 mg 8/21 INR 2.73  Goal of Therapy:  INR 2-3 Monitor platelets by anticoagulation protocol: Yes   Plan:  INR therapeutic today but significant increase in INR. Will give lower dose today of warfarin 3 mg. INR in AM.   Rayna Sexton L 08/28/2015,1:32 PM

## 2015-08-28 NOTE — Plan of Care (Signed)
Problem: Pain Managment: Goal: General experience of comfort will improve Outcome: Progressing C/o headache and abd pain. Norco givenx2 with improvement.   Problem: Physical Regulation: Goal: Ability to maintain clinical measurements within normal limits will improve Outcome: Progressing Pt had dialysis today. Low grade fever.  Maalox given once for indigestion with improvement. Zofran given once for nausea with good effect.  Problem: Nutrition: Goal: Adequate nutrition will be maintained Outcome: Not Progressing Poor appetite. Pt encouraged to eat and to drink.

## 2015-08-28 NOTE — Progress Notes (Signed)
Post dialysis 

## 2015-08-28 NOTE — Progress Notes (Signed)
Dialysis started 

## 2015-08-28 NOTE — Progress Notes (Signed)
ANTIBIOTIC CONSULT NOTE - Follow up   Pharmacy Consult for Vancomycin , Zosyn  Indication: sepsis  Allergies  Allergen Reactions  . Adhesive [Tape] Itching    Paper tape is ok to use.    Patient Measurements: Height: 5\' 6"  (167.6 cm) Weight: 153 lb 11.2 oz (69.7 kg) IBW/kg (Calculated) : 59.3 Adjusted Body Weight: 63.5 kg   Vital Signs: Temp: 99.3 F (37.4 C) (08/21 0422) Temp Source: Oral (08/21 0422) BP: 128/69 (08/21 0422) Pulse Rate: 88 (08/21 0422) Intake/Output from previous day: 08/20 0701 - 08/21 0700 In: 170 [P.O.:120; IV Piggyback:50] Out: 200 [Urine:200] Intake/Output from this shift: No intake/output data recorded.  Labs:  Recent Labs  08/27/15 0007 08/27/15 0553  WBC 8.8 9.0  HGB 9.6* 7.8*  PLT 134* 114*  CREATININE 7.36* 7.75*   Estimated Creatinine Clearance: 7.1 mL/min (by C-G formula based on SCr of 7.75 mg/dL). No results for input(s): VANCOTROUGH, VANCOPEAK, VANCORANDOM, GENTTROUGH, GENTPEAK, GENTRANDOM, TOBRATROUGH, TOBRAPEAK, TOBRARND, AMIKACINPEAK, AMIKACINTROU, AMIKACIN in the last 72 hours.   Microbiology: Recent Results (from the past 720 hour(s))  Blood Culture (routine x 2)     Status: None (Preliminary result)   Collection Time: 08/27/15 12:41 AM  Result Value Ref Range Status   Specimen Description BLOOD RIGHT ASSIST CONTROL  Final   Special Requests BOTTLES DRAWN AEROBIC AND ANAEROBIC [place]  Final   Culture NO GROWTH 1 DAY  Final   Report Status PENDING  Incomplete  Blood Culture (routine x 2)     Status: None (Preliminary result)   Collection Time: 08/27/15 12:41 AM  Result Value Ref Range Status   Specimen Description BLOOD LEFT ASSIST CONTROL  Final   Special Requests   Final    BOTTLES DRAWN AEROBIC AND ANAEROBIC 12CCAERO,7CCANA   Culture NO GROWTH 1 DAY  Final   Report Status PENDING  Incomplete  MRSA PCR Screening     Status: None   Collection Time: 08/27/15  9:37 AM  Result Value Ref Range Status   MRSA by PCR NEGATIVE  NEGATIVE Final    Comment:        The GeneXpert MRSA Assay (FDA approved for NASAL specimens only), is one component of a comprehensive MRSA colonization surveillance program. It is not intended to diagnose MRSA infection nor to guide or monitor treatment for MRSA infections.     Assessment: Pharmacy consulted to dose vancomycin and zosyn in this 60 year old female admitted with sepsis, on HD at home on MWF.   Goal of Therapy:  Vanc trough 15-25 mcg/mL pre-HD  Plan:  Expected duration 7 days with resolution of temperature and/or normalization of WBC   Zosyn 3.375 gm IV Q12H ordered to start on 8/20.   Vancomycin 1 gm IV X 1 given on 8/20 @ 00:30. Vancomycin 500 mg IV X 1 ordered to be given following intial 1 gm dose to make total loading dose of 1500 mg - given 8/20 at 0658. Vancomycin 750 mg IV Q MWF after each HD session ordered.  Will draw 1st trough 30 minutes before HD session on 8/25.   Pharmacy will continue to follow.   Rayna Sexton L 08/28/2015,8:10 AM

## 2015-08-28 NOTE — Progress Notes (Signed)
[facility]  ROUNDING NOTE   Subjective:   Seen and examined on hemodialysis. Tolerating treatment well.   Objective:  Vital signs in last 24 hours:  Temp:  [98.7 F (37.1 C)-101.1 F (38.4 C)] 98.7 F (37.1 C) (08/21 1024) Pulse Rate:  [88-101] 95 (08/21 1130) Resp:  [18-26] 22 (08/21 1130) BP: (100-128)/(65-72) 100/65 (08/21 1130) SpO2:  [95 %-100 %] 95 % (08/21 1130) Weight:  [71.1 kg (156 lb 12 oz)] 71.1 kg (156 lb 12 oz) (08/21 1024)  Weight change:  Filed Weights   08/27/15 0023 08/27/15 0536 08/28/15 1024  Weight: 69.4 kg (153 lb) 69.7 kg (153 lb 11.2 oz) 71.1 kg (156 lb 12 oz)    Intake/Output: I/O last 3 completed shifts: In: 75 [P.O.:120; IV Piggyback:300] Out: 300 [Urine:300]   Intake/Output this shift:  Total I/O In: 240 [P.O.:240] Out: 125 [Urine:125]  Physical Exam: General: No acute distress  Head: Normocephalic, atraumatic. Moist oral mucosal membranes  Eyes: Anicteric  Neck: Supple, trachea midline  Lungs:  Clear to auscultation, normal effort  Heart: S1S2 no rubs  Abdomen:  Soft, nontender, bowel sounds present  Extremities: trace peripheral edema.  Neurologic: Nonfocal, moving all four extremities  Skin: No lesions  Access: R IJ permcath    Basic Metabolic Panel:  Recent Labs Lab 08/24/15 0932 08/27/15 0007 08/27/15 0553  NA 136 138 137  K 4.1 4.5 5.2*  CL 104 105 105  CO2 24 22 21*  GLUCOSE 91 108* 108*  BUN 27* 45* 52*  CREATININE 5.94* 7.36* 7.75*  CALCIUM 7.4* 7.4* 8.0*    Liver Function Tests:  Recent Labs Lab 08/24/15 0932 08/27/15 0007  AST 16 20  ALT 5* 6*  ALKPHOS 663* 569*  BILITOT 0.4 0.3  PROT 6.7 6.4*  ALBUMIN 3.4* 3.3*   No results for input(s): LIPASE, AMYLASE in the last 168 hours. No results for input(s): AMMONIA in the last 168 hours.  CBC:  Recent Labs Lab 08/24/15 0932 08/27/15 0007 08/27/15 0553  WBC 6.2 8.8 9.0  NEUTROABS 3.9 7.8*  --   HGB 8.2* 9.6* 7.8*  HCT 24.0*  28.1* 23.2*  MCV 86.0 86.7 86.0  PLT 172 134* 114*    Cardiac Enzymes:  Recent Labs Lab 08/27/15 0007  TROPONINI 0.03*    BNP: Invalid input(s): POCBNP  CBG: No results for input(s): GLUCAP in the last 168 hours.  Microbiology: Results for orders placed or performed during the hospital encounter of 08/26/15  Blood Culture (routine x 2)     Status: None (Preliminary result)   Collection Time: 08/27/15 12:41 AM  Result Value Ref Range Status   Specimen Description BLOOD RIGHT ASSIST CONTROL  Final   Special Requests BOTTLES DRAWN AEROBIC AND ANAEROBIC [place]  Final   Culture NO GROWTH 1 DAY  Final   Report Status PENDING  Incomplete  Blood Culture (routine x 2)     Status: None (Preliminary result)   Collection Time: 08/27/15 12:41 AM  Result Value Ref Range Status   Specimen Description BLOOD LEFT ASSIST CONTROL  Final   Special Requests   Final    BOTTLES DRAWN AEROBIC AND ANAEROBIC 12CCAERO,7CCANA   Culture NO GROWTH 1 DAY  Final   Report Status PENDING  Incomplete  MRSA PCR Screening     Status: None   Collection Time: 08/27/15  9:37 AM  Result Value Ref Range Status   MRSA by PCR NEGATIVE NEGATIVE Final    Comment:  The GeneXpert MRSA Assay (FDA approved for NASAL specimens only), is one component of a comprehensive MRSA colonization surveillance program. It is not intended to diagnose MRSA infection nor to guide or monitor treatment for MRSA infections.     Coagulation Studies:  Recent Labs  08/27/15 0007 08/28/15 0551  LABPROT 21.4* 29.5*  INR 1.83 2.73    Urinalysis:  Recent Labs  08/27/15 0200  COLORURINE YELLOW*  LABSPEC 1.010  PHURINE 7.0  GLUCOSEU NEGATIVE  HGBUR NEGATIVE  BILIRUBINUR NEGATIVE  KETONESUR NEGATIVE  PROTEINUR 100*  NITRITE NEGATIVE  LEUKOCYTESUR NEGATIVE      Imaging: Dg Chest Port 1 View  Result Date: 08/27/2015 CLINICAL DATA:  Fever and chills EXAM: PORTABLE CHEST 1 VIEW COMPARISON:  Six days ago  FINDINGS: Chronic mild cardiomegaly. Stable mediastinal contours. Bilateral central line remain in stable position with tips at the SVC level. There is no edema, consolidation, effusion, or pneumothorax. IMPRESSION: Negative for pneumonia. Electronically Signed   By: Monte Fantasia M.D.   On: 08/27/2015 00:50     Medications:     . cinacalcet  120 mg Oral Q breakfast  . clonazePAM  1 mg Oral Daily  . losartan  100 mg Oral Q T,Th,S,Su  . metoprolol  50 mg Oral 2 times per day on Sun Tue Thu Sat  . pantoprazole  40 mg Oral Daily  . piperacillin-tazobactam (ZOSYN)  IV  3.375 g Intravenous Q12H  . sertraline  100 mg Oral QHS  . sevelamer carbonate  2,400 mg Oral TID WC  . warfarin  5 mg Oral q1800  . Warfarin - Physician Dosing Inpatient   Does not apply q1800   acetaminophen **OR** acetaminophen, alum & mag hydroxide-simeth, HYDROcodone-acetaminophen, ondansetron **OR** ondansetron (ZOFRAN) IV, prochlorperazine, senna-docusate, zolpidem  Assessment/ Plan:  60 y.o. black female ESRD on HD MWF, hypertension, breast cancer, gout, history of DVT, psoriasis who presented to [facility] with fevers, chills, status post fall.  [facility]/MWF/N. [place].  1. ESRD on HD MWF: Seen and examined on hemodialysis. Tolerating treatment well.   2. Anemia of chronic kidney disease.  - Avoid Epogen given recent diagnosis of breast cancer.  3. Secondary hyperparathyroidism. -  Continue cinacalcet and Renvela   4. Hypertension.   - Continue losartan and metoprolol.   LOS: 0 Kevyn Wengert 8/21/201711:58 AM

## 2015-08-29 LAB — PHOSPHORUS: PHOSPHORUS: 3.6 mg/dL (ref 2.5–4.6)

## 2015-08-29 LAB — PARATHYROID HORMONE, INTACT (NO CA): PTH: 936 pg/mL — AB (ref 15–65)

## 2015-08-29 LAB — PROTIME-INR
INR: 3.14
Prothrombin Time: 33 seconds — ABNORMAL HIGH (ref 11.4–15.2)

## 2015-08-29 MED ORDER — LEVOFLOXACIN 250 MG PO TABS: 250.0000 mg | ORAL_TABLET | ORAL | 0 refills | Status: DC

## 2015-08-29 MED ORDER — WARFARIN SODIUM 2.5 MG PO TABS: 2.5000 mg | ORAL_TABLET | Freq: Once | ORAL | Status: DC

## 2015-08-29 MED ORDER — LEVOFLOXACIN 500 MG PO TABS
250.0000 mg | ORAL_TABLET | ORAL | Status: DC
  Administered 2015-08-29: 250 mg via ORAL
  Filled 2015-08-29: qty 1

## 2015-08-29 NOTE — Discharge Summary (Signed)
[place] [facility] NAME: Teresa Cook    MR#:  [id number]  DATE OF BIRTH:  [birth year]  DATE OF ADMISSION:  08/26/2015 ADMITTING PHYSICIAN: Saundra Shelling, MD  DATE OF DISCHARGE: 08/29/15  PRIMARY CARE PHYSICIAN: Murlean Iba, MD    ADMISSION DIAGNOSIS:  Shaking chills [R68.83] Central line infection, initial encounter [T80.219A] Sepsis, due to unspecified organism ([place]) [A41.9]  DISCHARGE DIAGNOSIS:  Febrile illness -resovled Breast cancer s/p first chemo past week ESRD on HD Leucopenia/anemia -due to chemo and chronic illness  SECONDARY DIAGNOSIS:   Past Medical History:  Diagnosis Date  . Anemia   . Anxiety   . Breast cancer ([place])   . CHF (congestive heart failure) ([place])   . Chronic kidney disease   . Depression   . Dialysis patient ([place])   . DVT (deep venous thrombosis) ([facility])    left leg  . DVT (deep venous thrombosis) ([place])   . Gout   . HTN (hypertension)   . Hypertension   . Parathyroid abnormality ([place])   . Parathyroid disease ([place])   . Psoriasis     HOSPITAL COURSE:   60 year old female patient with history of end-stage renal disease on dialysis, breast cancer on chemotherapy, congestive heart failure, hypertension, gout presented to the emergency room with fever and chills.  1.Systemic inflammatory response syndrome/ febrile illness suspect Chemo induced -so far no source of fever identified -IV vanc and zosyn--- to levaquin (3 doses) -BC neg -CXr neg -Wbc low due to chemo -no fever curve for 24 hours  2.End-stage renal disease on dialysis -cont in house HD  3.Breast cancer --finished first cycle of chemo last Thursday  4.Hypertension Cont home meds  D/c home with out pt with Oncology this thursday CONSULTS OBTAINED:  Treatment Team:  Anthonette Legato, MD  DRUG ALLERGIES:   Allergies  Allergen Reactions  . Adhesive [Tape] Itching    Paper tape is ok to use.    DISCHARGE  MEDICATIONS:   Current Discharge Medication List    START taking these medications   Details  levofloxacin (LEVAQUIN) 250 MG tablet Take 1 tablet (250 mg total) by mouth every other day. Qty: 3 tablet, Refills: 0      CONTINUE these medications which have NOT CHANGED   Details  clonazePAM (KLONOPIN) 1 MG tablet Take 0.5-1 mg by mouth daily.     HYDROcodone-acetaminophen (NORCO/VICODIN) 5-325 MG tablet Take 1-2 tablets by mouth every 4 (four) hours as needed for moderate pain or severe pain. Qty: 20 tablet, Refills: 0    lidocaine-prilocaine (EMLA) cream Apply 1 application topically as needed. Qty: 30 g, Refills: 2    losartan (COZAAR) 50 MG tablet Take 100 mg by mouth daily. Takes on non-dialysis days=Tuesday, Thursday, Saturday and Sunday.    metoprolol (LOPRESSOR) 100 MG tablet Take 50 mg by mouth 2 (two) times daily. Takes on non-dialysis days=Tuesday, Thursday, Saturday and Sunday.    ondansetron (ZOFRAN) 8 MG tablet Take 1 tablet (8 mg total) by mouth every 8 (eight) hours as needed for nausea or vomiting. Qty: 30 tablet, Refills: 2    pantoprazole (PROTONIX) 20 MG tablet Take 20 mg by mouth daily.     prochlorperazine (COMPAZINE) 10 MG tablet Take 1 tablet (10 mg total) by mouth every 6 (six) hours as needed for nausea or vomiting. Qty: 30 tablet, Refills: 2    SENSIPAR 60 MG tablet Take 120 mg by mouth daily.     sertraline (ZOLOFT) 100 MG  tablet Take 100 mg by mouth at bedtime.     sevelamer carbonate (RENVELA) 800 MG tablet Take 2,400 mg by mouth 3 (three) times daily with meals.     traMADol (ULTRAM) 50 MG tablet Take 50 mg by mouth every 6 (six) hours as needed.     warfarin (COUMADIN) 5 MG tablet Take 1 tablet Friday, Saturday and Sundays. Take 1 1/2 tablets the other days of the week    zolpidem (AMBIEN) 10 MG tablet Take 10 mg by mouth at bedtime as needed for sleep.        If you experience worsening of your admission symptoms, develop shortness of  breath, life threatening emergency, suicidal or homicidal thoughts you must seek medical attention immediately by calling 911 or calling your MD immediately  if symptoms less severe.  You Must read complete instructions/literature along with all the possible adverse reactions/side effects for all the Medicines you take and that have been prescribed to you. Take any new Medicines after you have completely understood and accept all the possible adverse reactions/side effects.   Please note  You were cared for by a hospitalist during your hospital stay. If you have any questions about your discharge medications or the care you received while you were in the hospital after you are discharged, you can call the unit and asked to speak with the hospitalist on call if the hospitalist that took care of you is not available. Once you are discharged, your primary care physician will handle any further medical issues. Please note that NO REFILLS for any discharge medications will be authorized once you are discharged, as it is imperative that you return to your primary care physician (or establish a relationship with a primary care physician if you do not have one) for your aftercare needs so that they can reassess your need for medications and monitor your lab values. Today   SUBJECTIVE   Weak and nausea VITAL SIGNS:  Blood pressure 136/70, pulse (!) 105, temperature 98.5 F (36.9 C), temperature source Oral, resp. rate 18, height 5\' 6" (1.676 m), weight 153 lb 7 oz (69.6 kg), SpO2 97 %.  I/O:   Intake/Output Summary (Last 24 hours) at 08/29/15 1151 Last data filed at 08/28/15 1333  Gross per 24 hour  Intake                0 ml  Output             14 54 ml  Net            -1454 ml    PHYSICAL EXAMINATION:  GENERAL:  60 y.o.-year-old patient lying in the bed with no acute distress.  EYES: Pupils equal, round, reactive to light and accommodation. No scleral icterus. Extraocular muscles intact.  HEENT:  Head atraumatic, normocephalic. Oropharynx and nasopharynx clear.  NECK:  Supple, no jugular venous distention. No thyroid enlargement, no tenderness.  LUNGS: Normal breath sounds bilaterally, no wheezing, rales,rhonchi or crepitation. No use of accessory muscles of respiration.  CARDIOVASCULAR: S1, S2 normal. No murmurs, rubs, or gallops.  ABDOMEN: Soft, non-tender, non-distended. Bowel sounds present. No organomegaly or mass.  EXTREMITIES: No pedal edema, cyanosis, or clubbing.  NEUROLOGIC: Cranial nerves II through XII are intact. Muscle strength 5/5 in all extremities. Sensation intact. Gait not checked.  PSYCHIATRIC: The patient is alert and oriented x 3.  SKIN: No obvious rash, lesion, or ulcer.   DATA REVIEW:   CBC   Recent Labs Lab 08/27/15 [phone]  WBC 9.0  HGB 7.8*  HCT 23.2*  PLT 114*    Chemistries   Recent Labs Lab 08/27/15 0007 08/27/15 0553  NA 138 137  K 4.5 5.2*  CL 105 105  CO2 22 21*  GLUCOSE 108* 108*  BUN 45* 52*  CREATININE 7.36* 7.75*  CALCIUM 7.4* 8.0*  AST 20  --   ALT 6*  --   ALKPHOS 569*  --   BILITOT 0.3  --     Microbiology Results   Recent Results (from the past 240 hour(s))  Blood Culture (routine x 2)     Status: None (Preliminary result)   Collection Time: 08/27/15 12:41 AM  Result Value Ref Range Status   Specimen Description BLOOD RIGHT ASSIST CONTROL  Final   Special Requests BOTTLES DRAWN AEROBIC AND ANAEROBIC [place]  Final   Culture NO GROWTH 2 DAYS  Final   Report Status PENDING  Incomplete  Blood Culture (routine x 2)     Status: None (Preliminary result)   Collection Time: 08/27/15 12:41 AM  Result Value Ref Range Status   Specimen Description BLOOD LEFT ASSIST CONTROL  Final   Special Requests   Final    BOTTLES DRAWN AEROBIC AND ANAEROBIC 12CCAERO,7CCANA   Culture NO GROWTH 2 DAYS  Final   Report Status PENDING  Incomplete  Urine culture     Status: None   Collection Time: 08/27/15  2:00 AM  Result Value Ref Range  Status   Specimen Description URINE, RANDOM  Final   Special Requests NONE  Final   Culture NO GROWTH Performed at [facility]   Final   Report Status 08/28/2015 FINAL  Final  MRSA PCR Screening     Status: None   Collection Time: 08/27/15  9:37 AM  Result Value Ref Range Status   MRSA by PCR NEGATIVE NEGATIVE Final    Comment:        The GeneXpert MRSA Assay (FDA approved for NASAL specimens only), is one component of a comprehensive MRSA colonization surveillance program. It is not intended to diagnose MRSA infection nor to guide or monitor treatment for MRSA infections.     RADIOLOGY:  No results found.   Management plans discussed with the patient, family and they are in agreement.  CODE STATUS:     Code Status Orders        Start     Ordered   08/27/15 0406  Full code  Continuous     08/27/15 0406    Code Status History    Date Active Date Inactive Code Status Order ID Comments User Context   07/09/2015  2:44 PM 07/12/2015  7:55 PM Full Code [id number]  Laverle Hobby, MD ED   09/19/2014 11:47 AM 09/20/2014  6:25 PM Full Code [id number]  Dustin Flock, MD ED   08/29/2014  2:28 PM 08/30/2014  5:43 PM Full Code [id number]  Fritzi Mandes, MD Inpatient    Advance Directive Documentation   Flowsheet Row Most Recent Value  Type of Advance Directive  Healthcare Power of Attorney  Pre-existing out of facility DNR order (yellow form or pink MOST form)  No data  "MOST" Form in Place?  No data      TOTAL TIME TAKING CARE OF THIS PATIENT: 40 minutes.    Haidyn Chadderdon M.D on 08/29/2015 at 11:51 AM  Between 7am to 6pm - Pager - [phone] After 6pm go to [email or web address] - [facility]  Office  [phone]  CC: Primary care physician; Murlean Iba, MD

## 2015-08-29 NOTE — Progress Notes (Signed)
[facility]  ROUNDING NOTE   Subjective:   Hemodialysis yesterday. Tolerated treatment well. UF of 1.454 litres  Patient complaining of epigastric pain and nausea this morning.   Afebrile last 24 hours.   Objective:  Vital signs in last 24 hours:  Temp:  [98.5 F (36.9 C)-100.3 F (37.9 C)] 98.5 F (36.9 C) (08/22 0440) Pulse Rate:  [95-105] 105 (08/22 0901) Resp:  [17-26] 18 (08/22 0901) BP: (92-136)/(53-71) 136/70 (08/22 0901) SpO2:  [94 %-100 %] 97 % (08/22 0901) Weight:  [69.6 kg (153 lb 7 oz)] 69.6 kg (153 lb 7 oz) (08/21 1333)  Weight change:  Filed Weights   08/27/15 0536 08/28/15 1024 08/28/15 1333  Weight: 69.7 kg (153 lb 11.2 oz) 71.1 kg (156 lb 12 oz) 69.6 kg (153 lb 7 oz)    Intake/Output: I/O last 3 completed shifts: In: 290 [P.O.:240; IV Piggyback:50] Out: [id number] [Urine:325; [id number]   Intake/Output this shift:  No intake/output data recorded.  Physical Exam: General: No acute distress  Head: Normocephalic, atraumatic. Moist oral mucosal membranes  Eyes: Anicteric  Neck: Supple, trachea midline  Lungs:  Clear to auscultation, normal effort  Heart: S1S2 no rubs  Abdomen:  Soft, nontender, bowel sounds present  Extremities: No peripheral edema.  Neurologic: Nonfocal, moving all four extremities  Skin: No lesions  Access: R IJ permcath    Basic Metabolic Panel:  Recent Labs Lab 08/24/15 0932 08/27/15 0007 08/27/15 0553 08/29/15 0056  NA 136 138 137  --   K 4.1 4.5 5.2*  --   CL 104 105 105  --   CO2 24 22 21*  --   GLUCOSE 91 108* 108*  --   BUN 27* 45* 52*  --   CREATININE 5.94* 7.36* 7.75*  --   CALCIUM 7.4* 7.4* 8.0*  --   PHOS  --   --   --  3.6    Liver Function Tests:  Recent Labs Lab 08/24/15 0932 08/27/15 0007  AST 16 20  ALT 5* 6*  ALKPHOS 663* 569*  BILITOT 0.4 0.3  PROT 6.7 6.4*  ALBUMIN 3.4* 3.3*   No results for input(s): LIPASE, AMYLASE in the last 168 hours. No results for input(s): AMMONIA in  the last 168 hours.  CBC:  Recent Labs Lab 08/24/15 0932 08/27/15 0007 08/27/15 0553  WBC 6.2 8.8 9.0  NEUTROABS 3.9 7.8*  --   HGB 8.2* 9.6* 7.8*  HCT 24.0* 28.1* 23.2*  MCV 86.0 86.7 86.0  PLT 172 134* 114*    Cardiac Enzymes:  Recent Labs Lab 08/27/15 0007  TROPONINI 0.03*    BNP: Invalid input(s): POCBNP  CBG: No results for input(s): GLUCAP in the last 168 hours.  Microbiology: Results for orders placed or performed during the hospital encounter of 08/26/15  Blood Culture (routine x 2)     Status: None (Preliminary result)   Collection Time: 08/27/15 12:41 AM  Result Value Ref Range Status   Specimen Description BLOOD RIGHT ASSIST CONTROL  Final   Special Requests BOTTLES DRAWN AEROBIC AND ANAEROBIC [place]  Final   Culture NO GROWTH 2 DAYS  Final   Report Status PENDING  Incomplete  Blood Culture (routine x 2)     Status: None (Preliminary result)   Collection Time: 08/27/15 12:41 AM  Result Value Ref Range Status   Specimen Description BLOOD LEFT ASSIST CONTROL  Final   Special Requests   Final    BOTTLES DRAWN AEROBIC AND ANAEROBIC 12CCAERO,7CCANA   Culture  NO GROWTH 2 DAYS  Final   Report Status PENDING  Incomplete  Urine culture     Status: None   Collection Time: 08/27/15  2:00 AM  Result Value Ref Range Status   Specimen Description URINE, RANDOM  Final   Special Requests NONE  Final   Culture NO GROWTH Performed at [facility]   Final   Report Status 08/28/2015 FINAL  Final  MRSA PCR Screening     Status: None   Collection Time: 08/27/15  9:37 AM  Result Value Ref Range Status   MRSA by PCR NEGATIVE NEGATIVE Final    Comment:        The GeneXpert MRSA Assay (FDA approved for NASAL specimens only), is one component of a comprehensive MRSA colonization surveillance program. It is not intended to diagnose MRSA infection nor to guide or monitor treatment for MRSA infections.     Coagulation Studies:  Recent Labs  08/27/15 0007  08/28/15 0551 08/29/15 0056  LABPROT 21.4* 29.5* 33.0*  INR 1.83 2.73 3.14    Urinalysis:  Recent Labs  08/27/15 0200  COLORURINE YELLOW*  LABSPEC 1.010  PHURINE 7.0  GLUCOSEU NEGATIVE  HGBUR NEGATIVE  BILIRUBINUR NEGATIVE  KETONESUR NEGATIVE  PROTEINUR 100*  NITRITE NEGATIVE  LEUKOCYTESUR NEGATIVE      Imaging: No results found.   Medications:     . cinacalcet  120 mg Oral Q breakfast  . clonazePAM  1 mg Oral Daily  . levofloxacin  250 mg Oral Q48H  . losartan  100 mg Oral Q T,Th,S,Su  . metoprolol  50 mg Oral 2 times per day on Sun Tue Thu Sat  . pantoprazole  40 mg Oral Daily  . sertraline  100 mg Oral QHS  . sevelamer carbonate  2,400 mg Oral TID WC  . warfarin  2.5 mg Oral ONCE-1800  . Warfarin - Pharmacist Dosing Inpatient   Does not apply q1800   acetaminophen **OR** acetaminophen, alum & mag hydroxide-simeth, HYDROcodone-acetaminophen, ondansetron **OR** ondansetron (ZOFRAN) IV, prochlorperazine, senna-docusate, zolpidem  Assessment/ Plan:  60 y.o. black female ESRD on HD MWF, hypertension, breast cancer, gout, history of DVT, psoriasis who presented to [facility] with fevers, chills, status post fall.  [facility] nephrology/MWF/[address]  1. ESRD on HD MWF: Tolerated dialysis well. Next treatment for Wednesday.   2. Anemia of chronic kidney disease.  - Avoid Epogen given recent diagnosis of breast cancer.  3. Secondary hyperparathyroidism. -  Continue cinacalcet and Renvela   4. Hypertension: at goal.  - Continue losartan and metoprolol.   LOS: 1 Kleo Dungee 8/22/201711:33 AM

## 2015-08-29 NOTE — Care Management Important Message (Signed)
Important Message  Patient Details  Name: Teresa Cook MRN: [id number] Date of Birth: 1955-07-21   Medicare Important Message Given:  Yes    Shelbie Ammons, RN 08/29/2015, 9:25 AM

## 2015-08-29 NOTE — Progress Notes (Signed)
ANTICOAGULATION CONSULT NOTE - Initial Consult  Pharmacy Consult for warfarin Indication: DVT (history of)  Allergies  Allergen Reactions  . Adhesive [Tape] Itching    Paper tape is ok to use.    Patient Measurements: Height: 5\' 6"  (167.6 cm) Weight: 153 lb 7 oz (69.6 kg) IBW/kg (Calculated) : 59.3   Vital Signs: Temp: 98.5 F (36.9 C) (08/22 0440) Temp Source: Oral (08/22 0440) BP: 112/66 (08/22 0440) Pulse Rate: 95 (08/22 0440)  Labs:  Recent Labs  08/27/15 0007 08/27/15 0553 08/28/15 0551 08/29/15 0056  HGB 9.6* 7.8*  --   --   HCT 28.1* 23.2*  --   --   PLT 134* 114*  --   --   LABPROT 21.4*  --  29.5* 33.0*  INR 1.83  --  2.73 3.14  CREATININE 7.36* 7.75*  --   --   TROPONINI 0.03*  --   --   --     Estimated Creatinine Clearance: 7.1 mL/min (by C-G formula based on SCr of 7.75 mg/dL).   Assessment: 60 yo female on warfarin prior to admission with hx of DVT. Called pt's room - pt states she takes whole pill Fri, Sat, Sun and rest of the days she takes a pill and a half. Pt states 5 mg tab (confirms they are a peachy pink color). Per rounding MD today, pt does not appear fluid overloaded.   8/20 INR 1.83, warfarin 5 mg 8/21 INR 2.73  Warfarin 3 mg 8/22 INR 3.14  Goal of Therapy:  INR 2-3 Monitor platelets by anticoagulation protocol: Yes   Plan:  INR slightly supratherapeutic today.  Will give warfarin 2.5 mg today. INR in AM.  (patient on Zosyn)  Kassadie Pancake A 08/29/2015,8:46 AM

## 2015-08-29 NOTE — Progress Notes (Signed)
Discharge paperwork reviewed with patient who verbalized understanding, all questions answered. Prescriptions attached, pt aware. Patient's friend to transport home.

## 2015-08-29 NOTE — Discharge Instructions (Signed)
Resume your HD as before °

## 2015-08-29 NOTE — Care Management (Signed)
Admitted to [facility] with the diagnosis of SIRS. Lives with husband, Leane Para [phone]). Daughter is [place] [phone]). Prescriptions are filled at [facility]. Primary care physician is at [facility], last seen this physician about a month ago. Established dialysis patient at [facility] on [address] x 3 years. Monday-Wednesday-Friday. Husband transport. Takes care of all basic activities of daily living herself, drives. No falls. Decreased appetite since this admission, lost 3 pounds. No home Health. No skilled facility. No home oxygen. Uses no aids for ambulation.  Husband will transport. Shelbie Ammons RN MSN CCM Care Management [phone]

## 2015-08-30 NOTE — Progress Notes (Deleted)
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  INTERVAL HISTORY: Patient returns to clinic today for further evaluation and initiation of cycle 1 of 4 of neoadjuvant Taxotere and Cytoxan.  She had a port placement recently without incident.  Currently, she is anxious but otherwise feels well. She continues to receive dialysis on Mondays, Wednesdays, and Fridays. She has no neurologic complaints. She denies any recent fevers. She is a good appetite and denies weight loss. She has no chest pain or shortness of breath. She denies any nausea, vomiting, constipation, or diarrhea. Patient otherwise feels well and offers no further specific complaints.  REVIEW OF SYSTEMS:   Review of Systems  Constitutional: Negative.  Negative for fever, malaise/fatigue and weight loss.  Respiratory: Negative.  Negative for cough and shortness of breath.   Cardiovascular: Negative.  Negative for chest pain.  Gastrointestinal: Negative.  Negative for abdominal pain.  Genitourinary: Negative.   Musculoskeletal: Negative.   Neurological: Negative.  Negative for weakness.  Psychiatric/Behavioral: The patient is nervous/anxious.     As per HPI. Otherwise, a complete review of systems is negatve.  PAST MEDICAL HISTORY: Past Medical History:  Diagnosis Date  . Anemia   . Anxiety   . Breast cancer ([place])   . CHF (congestive heart failure) ([place])   . Chronic kidney disease   . Depression   . Dialysis patient ([place])   . DVT (deep venous thrombosis) ([facility])    left leg  . DVT (deep venous thrombosis) ([place])   . Gout   . HTN (hypertension)   . Hypertension   . Parathyroid  abnormality ([place])   . Parathyroid disease ([place])   . Psoriasis     PAST SURGICAL HISTORY: Past Surgical History:  Procedure Laterality Date  . ABDOMINAL HYSTERECTOMY    . APPENDECTOMY    . BREAST BIOPSY Left 10/28/2013   benign  . BREAST EXCISIONAL BIOPSY Left 2002   benign  . INSERTION OF DIALYSIS CATHETER  2014  . PARTIAL HYSTERECTOMY    . PORTACATH PLACEMENT Left 08/21/2015   Procedure: INSERTION PORT-A-CATH;  Surgeon: Hubbard Robinson, MD;  Location: [facility] ORS;  Service: General;  Laterality: Left;    FAMILY HISTORY: Family History  Problem Relation Age of Onset  . Stroke Mother   . CVA Mother   . Hypertension Mother   . Hypertension Father   . Hypertension Sister   . Diabetes Sister   . Cancer Sister 6    Breast  . Stroke Brother   . Hypertension Brother   . Breast cancer Maternal Aunt 52       ADVANCED DIRECTIVES:    HEALTH MAINTENANCE: Social History  Substance Use Topics  . Smoking status: Never Smoker  . Smokeless tobacco: Never Used  . Alcohol use No     Colonoscopy:  PAP:  Bone density:  Lipid panel:  Allergies  Allergen Reactions  . Adhesive [Tape] Itching    Paper tape is ok to use.    Current Outpatient Prescriptions  Medication Sig Dispense Refill  . clonazePAM (KLONOPIN) 1 MG tablet Take 0.5-1 mg by mouth daily.     Marland Kitchen HYDROcodone-acetaminophen (NORCO/VICODIN) 5-325 MG tablet Take 1-2 tablets by mouth every 4 (  four) hours as needed for moderate pain or severe pain. 20 tablet 0  . levofloxacin (LEVAQUIN) 250 MG tablet Take 1 tablet (250 mg total) by mouth every other day. 3 tablet 0  . lidocaine-prilocaine (EMLA) cream Apply 1 application topically as needed. 30 g 2  . losartan (COZAAR) 50 MG tablet Take 100 mg by mouth daily. Takes on non-dialysis days=Tuesday, Thursday, Saturday and Sunday.    . metoprolol (LOPRESSOR) 100 MG tablet Take 50 mg by mouth 2 (two) times daily. Takes on non-dialysis days=Tuesday, Thursday, Saturday and  Sunday.    . ondansetron (ZOFRAN) 8 MG tablet Take 1 tablet (8 mg total) by mouth every 8 (eight) hours as needed for nausea or vomiting. 30 tablet 2  . pantoprazole (PROTONIX) 20 MG tablet Take 20 mg by mouth daily.     . prochlorperazine (COMPAZINE) 10 MG tablet Take 1 tablet (10 mg total) by mouth every 6 (six) hours as needed for nausea or vomiting. 30 tablet 2  . SENSIPAR 60 MG tablet Take 120 mg by mouth daily.     . sertraline (ZOLOFT) 100 MG tablet Take 100 mg by mouth at bedtime.     . sevelamer carbonate (RENVELA) 800 MG tablet Take 2,400 mg by mouth 3 (three) times daily with meals.     . traMADol (ULTRAM) 50 MG tablet Take 50 mg by mouth every 6 (six) hours as needed.     . warfarin (COUMADIN) 5 MG tablet Take 1 tablet Friday, Saturday and Sundays. Take 1 1/2 tablets the other days of the week    . zolpidem (AMBIEN) 10 MG tablet Take 10 mg by mouth at bedtime as needed for sleep.     No current facility-administered medications for this visit.     OBJECTIVE: There were no vitals filed for this visit.   There is no height or weight on file to calculate BMI.    ECOG FS:0 - Asymptomatic  General: Well-developed, well-nourished, no acute distress. Eyes: Pink conjunctiva, anicteric sclera. Breasts: Patient refused exam today. Lungs: Clear to auscultation bilaterally. Heart: Regular rate and rhythm. No rubs, murmurs, or gallops. Abdomen: Soft, nontender, nondistended. No organomegaly noted, normoactive bowel sounds. Musculoskeletal: No edema, cyanosis, or clubbing. Dialysis catheter in right chest wall. Neuro: Alert, answering all questions appropriately. Cranial nerves grossly intact. Skin: No rashes or petechiae noted. Psych: Normal affect.   LAB RESULTS:  Lab Results  Component Value Date   NA 137 08/27/2015   K 5.2 (H) 08/27/2015   CL 105 08/27/2015   CO2 21 (L) 08/27/2015   GLUCOSE 108 (H) 08/27/2015   BUN 52 (H) 08/27/2015   CREATININE 7.75 (H) 08/27/2015   CALCIUM  8.0 (L) 08/27/2015   PROT 6.4 (L) 08/27/2015   ALBUMIN 3.3 (L) 08/27/2015   AST 20 08/27/2015   ALT 6 (L) 08/27/2015   ALKPHOS 569 (H) 08/27/2015   BILITOT 0.3 08/27/2015   GFRNONAA 5 (L) 08/27/2015   GFRAA 6 (L) 08/27/2015    Lab Results  Component Value Date   WBC 9.0 08/27/2015   NEUTROABS 7.8 (H) 08/27/2015   HGB 7.8 (L) 08/27/2015   HCT 23.2 (L) 08/27/2015   MCV 86.0 08/27/2015   PLT 114 (L) 08/27/2015     STUDIES: Dg Chest Port 1 View  Result Date: 08/27/2015 CLINICAL DATA:  Fever and chills EXAM: PORTABLE CHEST 1 VIEW COMPARISON:  Six days ago FINDINGS: Chronic mild cardiomegaly. Stable mediastinal contours. Bilateral central line remain in stable position with tips at  the SVC level. There is no edema, consolidation, effusion, or pneumothorax. IMPRESSION: Negative for pneumonia. Electronically Signed   By: Monte Fantasia M.D.   On: 08/27/2015 00:50   Dg Chest Port 1 View  Result Date: 08/21/2015 CLINICAL DATA:  Status post port placement EXAM: PORTABLE CHEST 1 VIEW COMPARISON:  08/08/2015 FINDINGS: Right dialysis catheter is again identified and stable. A new left chest wall port is noted with the catheter tip in the superior aspect of the right atrium. No pneumothorax is noted. The lungs are clear. No bony abnormality is noted. IMPRESSION: Status post port placement without pneumothorax. Electronically Signed   By: Inez Catalina M.D.   On: 08/21/2015 09:32   Dg Chest Port 1 View  Result Date: 08/08/2015 CLINICAL DATA:  Respiratory failure EXAM: PORTABLE CHEST 1 VIEW COMPARISON:  August 07, 2015 FINDINGS: There has been interval clearing of pulmonary edema. Currently no edema or consolidation is evident. Heart is mildly enlarged with pulmonary vascularity within normal limits. There is atherosclerotic calcification in the aorta. Central catheter tip is in the superior vena cava. No pneumothorax. No bone lesions. No adenopathy. IMPRESSION: Currently no edema or consolidation.  Edema has cleared compared to 1 day prior. Stable cardiac prominence. There is aortic atherosclerosis. No pneumothorax. Electronically Signed   By: Lowella Grip III M.D.   On: 08/08/2015 07:30   Dg Chest Port 1 View  Result Date: 08/07/2015 CLINICAL DATA:  Respiratory distress.  Chronic renal failure EXAM: PORTABLE CHEST 1 VIEW COMPARISON:  July 11, 2015. FINDINGS: There is generalized interstitial pulmonary edema. There is probable mild alveolar edema in the right base. There is cardiomegaly with mild pulmonary venous hypertension. There is atherosclerotic calcification in the aorta. Central catheter tip is in the superior vena cava near the cavoatrial junction. No pneumothorax. No evident bone lesions. IMPRESSION: Evidence of a degree of congestive heart failure. Suspect alveolar edema in the right base, although early pneumonia in this area is possible. There is aortic atherosclerosis. No pneumothorax. Electronically Signed   By: Lowella Grip III M.D.   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  PLAN:    1. 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 Proceed with cycle 1 of 4 of Taxotere and Cytoxan today. Cytoxan will be dose reduce 50% secondary to ongoing dialysis. Once patient completes her treatment, will refer back to surgery. Patient may or may not require XRT adjuvantly depending on her final pathology results and whether or not she pursues lumpectomy or mastectomy. Finally, at the conclusion all of patient's treatments she will benefit from an aromatase inhibitor for total 5 years. Return to clinic in 1 week for laboratory work and to assess her toleration of treatment and then in 3 weeks for consideration of cycle 2. 2. End-stage renal  disease: Continue dialysis on Monday, Wednesday, and Fridays. 3. Thrombocytopenia: Mild, monitor.  Approximately 30 minutes was spent in discussion of which greater than 50% was consultation.   Patient expressed understanding and was in agreement with this plan. She also understands that She can call clinic at any time with any questions, concerns, or complaints.   Breast cancer of upper-inner quadrant of left female breast [facility])   Staging form: Breast, AJCC 7th Edition   - Clinical stage from 08/05/2015: Stage IIB (T2, N1,  M0) - Signed by [name], MD on 08/05/2015  [name], MD   08/30/2015 11:20 PM

## 2015-08-31 ENCOUNTER — Observation Stay
Admission: EM | Admit: 2015-08-31 | Discharge: 2015-09-02 | Disposition: A | Discharge status: Home / Self Care | Payer: Medicare Other | Attending: Specialist | Admitting: Specialist

## 2015-08-31 ENCOUNTER — Emergency Department: Payer: Medicare Other

## 2015-08-31 ENCOUNTER — Inpatient Hospital Stay: Payer: Medicare Other | Admitting: Oncology

## 2015-08-31 ENCOUNTER — Inpatient Hospital Stay: Payer: Medicare Other

## 2015-08-31 ENCOUNTER — Other Ambulatory Visit: Payer: Self-pay

## 2015-08-31 ENCOUNTER — Encounter: Payer: Self-pay | Admitting: Emergency Medicine

## 2015-08-31 DIAGNOSIS — N186 End stage renal disease: Secondary | ICD-10-CM | POA: Insufficient documentation

## 2015-08-31 DIAGNOSIS — F329 Major depressive disorder, single episode, unspecified: Secondary | ICD-10-CM | POA: Insufficient documentation

## 2015-08-31 DIAGNOSIS — C50212 Malignant neoplasm of upper-inner quadrant of left female breast: Secondary | ICD-10-CM | POA: Insufficient documentation

## 2015-08-31 DIAGNOSIS — D573 Sickle-cell trait: Secondary | ICD-10-CM | POA: Insufficient documentation

## 2015-08-31 DIAGNOSIS — M40204 Unspecified kyphosis, thoracic region: Secondary | ICD-10-CM | POA: Diagnosis not present

## 2015-08-31 DIAGNOSIS — Z79899 Other long term (current) drug therapy: Secondary | ICD-10-CM | POA: Insufficient documentation

## 2015-08-31 DIAGNOSIS — Z90710 Acquired absence of both cervix and uterus: Secondary | ICD-10-CM | POA: Diagnosis not present

## 2015-08-31 DIAGNOSIS — Z853 Personal history of malignant neoplasm of breast: Secondary | ICD-10-CM | POA: Insufficient documentation

## 2015-08-31 DIAGNOSIS — Z7901 Long term (current) use of anticoagulants: Secondary | ICD-10-CM | POA: Insufficient documentation

## 2015-08-31 DIAGNOSIS — A419 Sepsis, unspecified organism: Secondary | ICD-10-CM | POA: Diagnosis not present

## 2015-08-31 DIAGNOSIS — Z992 Dependence on renal dialysis: Secondary | ICD-10-CM | POA: Insufficient documentation

## 2015-08-31 DIAGNOSIS — R509 Fever, unspecified: Secondary | ICD-10-CM | POA: Diagnosis not present

## 2015-08-31 DIAGNOSIS — N2581 Secondary hyperparathyroidism of renal origin: Secondary | ICD-10-CM | POA: Insufficient documentation

## 2015-08-31 DIAGNOSIS — Z9889 Other specified postprocedural states: Secondary | ICD-10-CM | POA: Insufficient documentation

## 2015-08-31 DIAGNOSIS — F419 Anxiety disorder, unspecified: Secondary | ICD-10-CM | POA: Insufficient documentation

## 2015-08-31 DIAGNOSIS — I2510 Atherosclerotic heart disease of native coronary artery without angina pectoris: Secondary | ICD-10-CM | POA: Insufficient documentation

## 2015-08-31 DIAGNOSIS — Z803 Family history of malignant neoplasm of breast: Secondary | ICD-10-CM | POA: Insufficient documentation

## 2015-08-31 DIAGNOSIS — R000 Tachycardia, unspecified: Secondary | ICD-10-CM | POA: Diagnosis present

## 2015-08-31 DIAGNOSIS — L409 Psoriasis, unspecified: Secondary | ICD-10-CM | POA: Insufficient documentation

## 2015-08-31 DIAGNOSIS — I712 Thoracic aortic aneurysm, without rupture: Secondary | ICD-10-CM | POA: Insufficient documentation

## 2015-08-31 DIAGNOSIS — M109 Gout, unspecified: Secondary | ICD-10-CM | POA: Insufficient documentation

## 2015-08-31 DIAGNOSIS — N250 Renal osteodystrophy: Secondary | ICD-10-CM | POA: Diagnosis not present

## 2015-08-31 DIAGNOSIS — Z823 Family history of stroke: Secondary | ICD-10-CM | POA: Insufficient documentation

## 2015-08-31 DIAGNOSIS — I5022 Chronic systolic (congestive) heart failure: Secondary | ICD-10-CM | POA: Insufficient documentation

## 2015-08-31 DIAGNOSIS — R079 Chest pain, unspecified: Secondary | ICD-10-CM | POA: Diagnosis present

## 2015-08-31 DIAGNOSIS — I700 Atherosclerosis of aorta: Secondary | ICD-10-CM | POA: Diagnosis not present

## 2015-08-31 DIAGNOSIS — Z86718 Personal history of other venous thrombosis and embolism: Secondary | ICD-10-CM | POA: Diagnosis not present

## 2015-08-31 DIAGNOSIS — Z79891 Long term (current) use of opiate analgesic: Secondary | ICD-10-CM | POA: Insufficient documentation

## 2015-08-31 DIAGNOSIS — Z833 Family history of diabetes mellitus: Secondary | ICD-10-CM | POA: Insufficient documentation

## 2015-08-31 DIAGNOSIS — K219 Gastro-esophageal reflux disease without esophagitis: Secondary | ICD-10-CM | POA: Diagnosis not present

## 2015-08-31 DIAGNOSIS — I132 Hypertensive heart and chronic kidney disease with heart failure and with stage 5 chronic kidney disease, or end stage renal disease: Secondary | ICD-10-CM | POA: Insufficient documentation

## 2015-08-31 DIAGNOSIS — D631 Anemia in chronic kidney disease: Secondary | ICD-10-CM | POA: Insufficient documentation

## 2015-08-31 DIAGNOSIS — Z9049 Acquired absence of other specified parts of digestive tract: Secondary | ICD-10-CM | POA: Insufficient documentation

## 2015-08-31 HISTORY — DX: Disorder of kidney and ureter, unspecified: N28.9

## 2015-08-31 LAB — BASIC METABOLIC PANEL
Anion gap: 9 (ref 5–15)
BUN: 16 mg/dL (ref 6–20)
CO2: 29 mmol/L (ref 22–32)
CREATININE: 4.62 mg/dL — AB (ref 0.44–1.00)
Calcium: 9 mg/dL (ref 8.9–10.3)
Chloride: 103 mmol/L (ref 101–111)
GFR calc Af Amer: 11 mL/min — ABNORMAL LOW (ref >60)
GFR, EST NON AFRICAN AMERICAN: 9 mL/min — AB (ref >60)
Glucose, Bld: 97 mg/dL (ref 65–99)
POTASSIUM: 3.7 mmol/L (ref 3.5–5.1)
SODIUM: 141 mmol/L (ref 135–145)

## 2015-08-31 LAB — TROPONIN I
TROPONIN I: 0.03 ng/mL — AB (ref <0.03)
Troponin I: 0.09 ng/mL (ref <0.03)
Troponin I: 0.03 ng/mL (ref <0.03)
Troponin I: 0.05 ng/mL (ref <0.03)

## 2015-08-31 LAB — CBC
HEMATOCRIT: 23 % — AB (ref 35.0–47.0)
Hemoglobin: 7.9 g/dL — ABNORMAL LOW (ref 12.0–16.0)
MCH: 29.6 pg (ref 26.0–34.0)
MCHC: 34.4 g/dL (ref 32.0–36.0)
MCV: 86.1 fL (ref 80.0–100.0)
PLATELETS: 148 10*3/uL — AB (ref 150–440)
RBC: 2.67 MIL/uL — ABNORMAL LOW (ref 3.80–5.20)
RDW: 15 % — AB (ref 11.5–14.5)
WBC: 7.3 10*3/uL (ref 3.6–11.0)

## 2015-08-31 LAB — TSH: TSH: 3.399 u[IU]/mL (ref 0.350–4.500)

## 2015-08-31 LAB — PROTIME-INR
INR: 2.42
Prothrombin Time: 26.8 seconds — ABNORMAL HIGH (ref 11.4–15.2)

## 2015-08-31 LAB — HEMOGLOBIN A1C

## 2015-08-31 MED ORDER — ASPIRIN EC 81 MG PO TBEC
81.0000 mg | DELAYED_RELEASE_TABLET | Freq: Every day | ORAL | Status: DC
  Administered 2015-08-31: 81 mg via ORAL
  Filled 2015-08-31 (×2): qty 1

## 2015-08-31 MED ORDER — ONDANSETRON HCL 4 MG PO TABS: 8.0000 mg | ORAL_TABLET | Freq: Three times a day (TID) | ORAL | Status: DC | PRN

## 2015-08-31 MED ORDER — ONDANSETRON HCL 4 MG/2ML IJ SOLN
4.0000 mg | Freq: Once | INTRAMUSCULAR | Status: AC
  Administered 2015-08-31: 4 mg via INTRAVENOUS

## 2015-08-31 MED ORDER — TRAMADOL HCL 50 MG PO TABS
50.0000 mg | ORAL_TABLET | Freq: Four times a day (QID) | ORAL | Status: DC | PRN
  Administered 2015-09-01: 50 mg via ORAL
  Filled 2015-08-31: qty 1

## 2015-08-31 MED ORDER — ACETAMINOPHEN 325 MG PO TABS
650.0000 mg | ORAL_TABLET | Freq: Four times a day (QID) | ORAL | Status: DC | PRN
  Administered 2015-08-31: 650 mg via ORAL
  Filled 2015-08-31: qty 2

## 2015-08-31 MED ORDER — CLONAZEPAM 0.5 MG PO TABS
0.5000 mg | ORAL_TABLET | Freq: Every day | ORAL | Status: DC
  Administered 2015-08-31 – 2015-09-01 (×2): 0.5 mg via ORAL
  Filled 2015-08-31 (×2): qty 1

## 2015-08-31 MED ORDER — SERTRALINE HCL 100 MG PO TABS
100.0000 mg | ORAL_TABLET | Freq: Every day | ORAL | Status: DC
  Administered 2015-08-31 – 2015-09-01 (×2): 100 mg via ORAL
  Filled 2015-08-31 (×2): qty 1

## 2015-08-31 MED ORDER — SEVELAMER CARBONATE 800 MG PO TABS
2400.0000 mg | ORAL_TABLET | Freq: Three times a day (TID) | ORAL | Status: DC
  Administered 2015-08-31 – 2015-09-02 (×4): 2400 mg via ORAL
  Filled 2015-08-31 (×4): qty 3

## 2015-08-31 MED ORDER — METOPROLOL TARTRATE 50 MG PO TABS
50.0000 mg | ORAL_TABLET | ORAL | Status: DC
  Administered 2015-08-31 (×2): 50 mg via ORAL
  Filled 2015-08-31 (×2): qty 1

## 2015-08-31 MED ORDER — DOCUSATE SODIUM 100 MG PO CAPS
100.0000 mg | ORAL_CAPSULE | Freq: Two times a day (BID) | ORAL | Status: DC
  Administered 2015-08-31 – 2015-09-01 (×3): 100 mg via ORAL
  Filled 2015-08-31 (×3): qty 1

## 2015-08-31 MED ORDER — CINACALCET HCL 30 MG PO TABS
120.0000 mg | ORAL_TABLET | Freq: Every day | ORAL | Status: DC
  Administered 2015-08-31 – 2015-09-01 (×2): 120 mg via ORAL
  Filled 2015-08-31 (×2): qty 4

## 2015-08-31 MED ORDER — LOSARTAN POTASSIUM 50 MG PO TABS
50.0000 mg | ORAL_TABLET | ORAL | Status: DC
  Administered 2015-08-31: 50 mg via ORAL
  Filled 2015-08-31: qty 1

## 2015-08-31 MED ORDER — PROCHLORPERAZINE EDISYLATE 5 MG/ML IJ SOLN
10.0000 mg | Freq: Four times a day (QID) | INTRAMUSCULAR | Status: DC | PRN
Start: 2015-08-31 — End: 2015-09-02

## 2015-08-31 MED ORDER — PANTOPRAZOLE SODIUM 20 MG PO TBEC: 20.0000 mg | DELAYED_RELEASE_TABLET | Freq: Every day | ORAL | Status: DC

## 2015-08-31 MED ORDER — ZOLPIDEM TARTRATE 5 MG PO TABS
10.0000 mg | ORAL_TABLET | Freq: Every evening | ORAL | Status: DC | PRN
Start: 2015-08-31 — End: 2015-09-02
  Administered 2015-09-01: 10 mg via ORAL
  Filled 2015-08-31: qty 2

## 2015-08-31 MED ORDER — PANTOPRAZOLE SODIUM 40 MG PO TBEC
40.0000 mg | DELAYED_RELEASE_TABLET | Freq: Two times a day (BID) | ORAL | Status: DC
  Administered 2015-08-31 – 2015-09-02 (×3): 40 mg via ORAL
  Filled 2015-08-31 (×3): qty 1

## 2015-08-31 MED ORDER — METOPROLOL TARTRATE 50 MG PO TABS: 50.0000 mg | ORAL_TABLET | Freq: Two times a day (BID) | ORAL | Status: DC

## 2015-08-31 MED ORDER — MORPHINE SULFATE (PF) 4 MG/ML IV SOLN
INTRAVENOUS | Status: AC
  Administered 2015-08-31: 4 mg via INTRAVENOUS
  Filled 2015-08-31: qty 1

## 2015-08-31 MED ORDER — ACETAMINOPHEN 650 MG RE SUPP: 650.0000 mg | Freq: Four times a day (QID) | RECTAL | Status: DC | PRN

## 2015-08-31 MED ORDER — SODIUM CHLORIDE 0.9% FLUSH
3.0000 mL | Freq: Two times a day (BID) | INTRAVENOUS | Status: DC
  Administered 2015-08-31 – 2015-09-01 (×4): 3 mL via INTRAVENOUS

## 2015-08-31 MED ORDER — WARFARIN SODIUM 5 MG PO TABS
5.0000 mg | ORAL_TABLET | ORAL | Status: DC
  Administered 2015-09-01: 5 mg via ORAL
  Filled 2015-08-31: qty 1

## 2015-08-31 MED ORDER — PANTOPRAZOLE SODIUM 40 MG PO TBEC: 40.0000 mg | DELAYED_RELEASE_TABLET | Freq: Two times a day (BID) | ORAL | 0 refills | Status: DC

## 2015-08-31 MED ORDER — LOSARTAN POTASSIUM 50 MG PO TABS: 100.0000 mg | ORAL_TABLET | Freq: Every day | ORAL | Status: DC

## 2015-08-31 MED ORDER — HYDROCODONE-ACETAMINOPHEN 5-325 MG PO TABS
1.0000 | ORAL_TABLET | ORAL | Status: DC | PRN
  Administered 2015-08-31: 2 via ORAL
  Administered 2015-08-31: 1 via ORAL
  Filled 2015-08-31: qty 2
  Filled 2015-08-31: qty 1

## 2015-08-31 MED ORDER — IOPAMIDOL (ISOVUE-370) INJECTION 76%
75.0000 mL | Freq: Once | INTRAVENOUS | Status: AC | PRN
  Administered 2015-08-31: 75 mL via INTRAVENOUS

## 2015-08-31 MED ORDER — PANTOPRAZOLE SODIUM 40 MG PO TBEC
40.0000 mg | DELAYED_RELEASE_TABLET | Freq: Every day | ORAL | Status: DC
  Administered 2015-08-31: 40 mg via ORAL
  Filled 2015-08-31: qty 1

## 2015-08-31 MED ORDER — MORPHINE SULFATE (PF) 4 MG/ML IV SOLN
4.0000 mg | Freq: Once | INTRAVENOUS | Status: AC
  Administered 2015-08-31: 4 mg via INTRAVENOUS

## 2015-08-31 MED ORDER — WARFARIN SODIUM 5 MG PO TABS: 7.5000 mg | ORAL_TABLET | ORAL | Status: DC

## 2015-08-31 MED ORDER — ONDANSETRON HCL 4 MG/2ML IJ SOLN
INTRAMUSCULAR | Status: AC
  Administered 2015-08-31: 4 mg via INTRAVENOUS
  Filled 2015-08-31: qty 2

## 2015-08-31 MED ORDER — LEVOFLOXACIN 500 MG PO TABS
250.0000 mg | ORAL_TABLET | ORAL | Status: DC
  Administered 2015-08-31 – 2015-09-02 (×2): 250 mg via ORAL
  Filled 2015-08-31 (×2): qty 1

## 2015-08-31 NOTE — Consult Note (Signed)
[place]  CARDIOLOGY CONSULT NOTE  Patient ID: Teresa Cook MRN: [id number] DOB/AGE: 05-08-1955 60 y.o.  Admit date: 08/31/2015 Referring Physician Dr. 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 Pain is mid chest with radiation to her back and arms. Not necessarily exeritonal . Worse with deep palpation. Not necessarily exeritona. Had negative funcitonal study 102 years ago [facility] for work up for renal trpt. EF 45-50% by echo. Has ruled out for mi.   Review of Systems  Constitutional: Negative.   HENT: Negative.   Eyes: Negative.   Respiratory: Positive for shortness of breath.   Cardiovascular: Positive for chest pain.  Gastrointestinal: Negative.   Genitourinary: Negative.   Musculoskeletal: Negative.   Skin: Negative.   Neurological: Negative.   Endo/Heme/Allergies: Negative.   Psychiatric/Behavioral: Negative.     Past Medical History:  Diagnosis Date  . Anemia   . Anxiety   . Breast cancer ([place])   . CHF (congestive heart failure) ([place])   . Chronic kidney disease   . Depression   . Dialysis patient ([place])   . DVT (deep venous thrombosis) ([facility])    left leg  . DVT (deep venous thrombosis) ([place])   . Gout   . HTN (hypertension)   . Hypertension   . Parathyroid abnormality ([place])   . Parathyroid disease ([place])   . Psoriasis   . Renal insufficiency   . Sickle cell anemia ([facility])    traits    Family History  Problem Relation Age of Onset  . Stroke Mother   . CVA Mother   . Hypertension Mother   . Hypertension Father   . Hypertension Sister   . Diabetes Sister   . Cancer Sister 20    Breast  . Stroke Brother   . Hypertension Brother   . Breast cancer Maternal Aunt 70    Social History   Social History  . Marital status: Married    Spouse name: N/A  . Number of children:  N/A  . Years of education: N/A   Occupational History  . disabled    Social History Main Topics  . Smoking status: Never Smoker  . Smokeless tobacco: Never Used  . Alcohol use No  . Drug use: No  . Sexual activity: Not on file   Other Topics Concern  . Not on file   Social History Narrative   ** Merged History Encounter **        Past Surgical History:  Procedure Laterality Date  . ABDOMINAL HYSTERECTOMY    . APPENDECTOMY    . BREAST BIOPSY Left 10/28/2013   benign  . BREAST EXCISIONAL BIOPSY Left 2002   benign  . INSERTION OF DIALYSIS CATHETER  2014  . PARTIAL HYSTERECTOMY    . PORTACATH PLACEMENT Left 08/21/2015   Procedure: INSERTION PORT-A-CATH;  Surgeon: Hubbard Robinson, MD;  Location: [facility] ORS;  Service: General;  Laterality: Left;     Prescriptions Prior to Admission  Medication Sig Dispense Refill Last Dose  . clonazePAM (KLONOPIN) 1 MG tablet Take 0.5-1 mg by mouth daily.    08/30/2015 at Unknown time  . HYDROcodone-acetaminophen (NORCO/VICODIN) 5-325 MG tablet Take 1-2 tablets by mouth every 4 (four) hours as needed for moderate pain or severe pain. 20 tablet 0 prn  . levofloxacin (LEVAQUIN) 250 MG tablet  Take 1 tablet (250 mg total) by mouth every other day. 3 tablet 0 08/30/2015 at Unknown time  . lidocaine-prilocaine (EMLA) cream Apply 1 application topically as needed. 30 g 2 prn  . losartan (COZAAR) 50 MG tablet Take 100 mg by mouth daily. Takes on non-dialysis days=Tuesday, Thursday, Saturday and Sunday.   08/30/2015 at Unknown time  . metoprolol (LOPRESSOR) 100 MG tablet Take 50 mg by mouth 2 (two) times daily. Takes on non-dialysis days=Tuesday, Thursday, Saturday and Sunday.   08/30/2015 at Unknown time  . ondansetron (ZOFRAN) 8 MG tablet Take 1 tablet (8 mg total) by mouth every 8 (eight) hours as needed for nausea or vomiting. 30 tablet 2 prn  . prochlorperazine (COMPAZINE) 10 MG tablet Take 1 tablet (10 mg total) by mouth every 6 (six) hours as needed  for nausea or vomiting. 30 tablet 2 prn  . SENSIPAR 60 MG tablet Take 120 mg by mouth daily.    08/30/2015 at Unknown time  . sertraline (ZOLOFT) 100 MG tablet Take 100 mg by mouth at bedtime.    08/30/2015 at Unknown time  . sevelamer carbonate (RENVELA) 800 MG tablet Take 2,400 mg by mouth 3 (three) times daily with meals.    08/30/2015 at Unknown time  . traMADol (ULTRAM) 50 MG tablet Take 50 mg by mouth every 6 (six) hours as needed.    prn  . warfarin (COUMADIN) 5 MG tablet Take 1 tablet Friday, Saturday and Sundays. Take 1 1/2 tablets the other days of the week   08/30/2015 at Unknown time  . zolpidem (AMBIEN) 10 MG tablet Take 10 mg by mouth at bedtime as needed for sleep.   prn  . [DISCONTINUED] pantoprazole (PROTONIX) 20 MG tablet Take 20 mg by mouth daily.    08/30/2015 at Unknown time    Physical Exam: Blood pressure 105/60, pulse 99, temperature 98.5 F (36.9 C), temperature source Oral, resp. rate 19, height 5\' 6"  (1.676 m), weight 68.3 kg (150 lb 9.6 oz), SpO2 96 %.   Wt Readings from Last 1 Encounters:  08/31/15 68.3 kg (150 lb 9.6 oz)     General appearance: alert and cooperative Resp: clear to auscultation bilaterally Cardio: regular rate and rhythm GI: soft, non-tender; bowel sounds normal; no masses,  no organomegaly Extremities: extremities normal, atraumatic, no cyanosis or edema Neurologic: Grossly normal  Labs:   Lab Results  Component Value Date   WBC 7.3 08/31/2015   HGB 7.9 (L) 08/31/2015   HCT 23.0 (L) 08/31/2015   MCV 86.1 08/31/2015   PLT 148 (L) 08/31/2015    Recent Labs Lab 08/27/15 0007  08/31/15 0100  NA 138  < > 141  K 4.5  < > 3.7  CL 105  < > 103  CO2 22  < > 29  BUN 45*  < > 16  CREATININE 7.36*  < > 4.62*  CALCIUM 7.4*  < > 9.0  PROT 6.4*  --   --   BILITOT 0.3  --   --   ALKPHOS 569*  --   --   ALT 6*  --   --   AST 20  --   --   GLUCOSE 108*  < > 97  < > = values in this interval not displayed. Lab Results  Component Value Date    CKTOTAL 43 02/03/2014   CKMB 0.9 02/03/2014   TROPONINI 0.03 ([place]) 08/31/2015      Radiology: cxr-no acute cardiopulmonary disease.  EKG: sinus tachycardia with  no ischemia.  ASSESSMENT AND PLAN:  Pt with esrd on hd who was admitted with chest pain described as a sharp pain radiated through to her back. Pain is mostly constant and does not worsen with activity. EKG showed sr with no ischemia. CXR showed no active cardiopulmnary disease. She appears to have fuled out for an mi. Pain is somewhat atypcial [facility] revealed ef 45% with mild ai and mr. Funcitonal study done in 2016 revealed ef 48% with no stress induced ischemia.  Will repeat the functional study to determine if there is any interval change in anatomy. Further recs after funcitonal study.  Signed: Teodoro Spray MD, [facility] 08/31/2015, 4:54 PM

## 2015-08-31 NOTE — ED Provider Notes (Signed)
[facility] Emergency Department Provider Note  ____________________________________________   First MD Initiated Contact with Patient 08/31/15 [phone]     (approximate)  I have reviewed the triage vital signs and the nursing notes.   HISTORY  Chief Complaint Tachycardia and Chest Pain    HPI Teresa Cook is a 60 y.o. female with history of breast cancer acute respiratory failure pulmonary edema chronic kidney disease presents to the emergency department with 10 out of 10 sharp central chest pain with radiation to the right chest with onset yesterday morning that has been persistent. Patient also admits to rapid heart rate that has been irregular since onset of chest discomfort. In addition the patient admits to dyspnea and lightheadedness. Patient denies any nausea or vomiting. Patient of note was just discharged from the hospital yesterday   Past Medical History:  Diagnosis Date  . Anemia   . Anxiety   . Breast cancer ([place])   . CHF (congestive heart failure) ([place])   . Chronic kidney disease   . Depression   . Dialysis patient ([place])   . DVT (deep venous thrombosis) (HCC)    left leg  . DVT (deep venous thrombosis) ([place])   . Gout   . HTN (hypertension)   . Hypertension   . Parathyroid abnormality ([place])   . Parathyroid disease ([place])   . Psoriasis   . Renal insufficiency   . Sickle cell anemia (HCC)    traits    Patient Active Problem List   Diagnosis Date Noted  . Chest pain 08/31/2015  . SIRS (systemic inflammatory response syndrome) ([place]) 08/27/2015  . Breast cancer of upper-inner quadrant of left female breast ([place]) 08/05/2015  . Pulmonary edema 07/09/2015  . Dizziness 03/03/2015  . Seizures ([place]) 03/03/2015  . Spells ([place]) 11/11/2014  . Acute respiratory failure ([place]) 09/19/2014  . Left ventricular dysfunction 09/06/2014  . Acute pulmonary edema ([place]) 08/30/2014  . Respiratory failure ([place]) 08/29/2014  . Respiratory difficulty  08/29/2014  . Chronic systolic heart failure ([place]) 05/16/2014  . Hypertension 05/16/2014  . Renal failure 05/16/2014  . Anxiety 05/16/2014  . Sickle cell trait ([place]) 03/03/2014  . ESRD (end stage renal disease) on dialysis ([place]) 11/02/2013  . Dysgeusia 01/27/2013  . Weight loss 01/27/2013  . Resting tremor 08/13/2012  . Depression 04/30/2012  . 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 Right leg DVT ([place]) 04/30/2012    Past Surgical History:  Procedure Laterality Date  . ABDOMINAL HYSTERECTOMY    . APPENDECTOMY    . BREAST BIOPSY Left 10/28/2013   benign  . BREAST EXCISIONAL BIOPSY Left 2002   benign  . INSERTION OF DIALYSIS CATHETER  2014  . PARTIAL HYSTERECTOMY    . PORTACATH PLACEMENT Left 08/21/2015   Procedure: INSERTION PORT-A-CATH;  Surgeon: Hubbard Robinson, MD;  Location: [facility] ORS;  Service: General;  Laterality: Left;    Prior to Admission medications   Medication Sig Start Date End Date Taking? Authorizing Provider  clonazePAM (KLONOPIN) 1 MG tablet Take 0.5-1 mg by mouth daily.  08/23/15  Yes Historical Provider, MD  HYDROcodone-acetaminophen (NORCO/VICODIN) 5-325 MG tablet Take 1-2 tablets by mouth every 4 (four) hours as needed for moderate pain or severe pain. 08/21/15  Yes Hubbard Robinson, MD  levofloxacin (LEVAQUIN) 250 MG tablet Take 1 tablet (250 mg total) by mouth every other day. 08/29/15  Yes Fritzi Mandes, MD  lidocaine-prilocaine (EMLA) cream Apply 1 application topically as needed. 08/22/15  Yes [name], MD  losartan (COZAAR) 50 MG tablet Take 100 mg by mouth daily. Takes on non-dialysis days=Tuesday, Thursday, Saturday and Sunday. 09/07/14  Yes Historical Provider, MD  metoprolol (LOPRESSOR) 100 MG tablet Take 50 mg by mouth 2 (two) times daily. Takes on non-dialysis days=Tuesday, Thursday, Saturday and Sunday.   Yes Historical Provider, MD  ondansetron (ZOFRAN) 8 MG tablet Take 1 tablet (8 mg total) by mouth every 8 (eight) hours as  needed for nausea or vomiting. 08/24/15  Yes [name], MD  pantoprazole (PROTONIX) 20 MG tablet Take 20 mg by mouth daily.    Yes Historical Provider, MD  prochlorperazine (COMPAZINE) 10 MG tablet Take 1 tablet (10 mg total) by mouth every 6 (six) hours as needed for nausea or vomiting. 08/24/15  Yes [name], MD  SENSIPAR 60 MG tablet Take 120 mg by mouth daily.  08/24/15  Yes Historical Provider, MD  sertraline (ZOLOFT) 100 MG tablet Take 100 mg by mouth at bedtime.  03/05/12  Yes Historical Provider, MD  sevelamer carbonate (RENVELA) 800 MG tablet Take 2,400 mg by mouth 3 (three) times daily with meals.    Yes Historical Provider, MD  traMADol (ULTRAM) 50 MG tablet Take 50 mg by mouth every 6 (six) hours as needed.  02/20/15  Yes Historical Provider, MD  warfarin (COUMADIN) 5 MG tablet Take 1 tablet Friday, Saturday and Sundays. Take 1 1/2 tablets the other days of the week 11/03/13  Yes Historical Provider, MD  zolpidem (AMBIEN) 10 MG tablet Take 10 mg by mouth at bedtime as needed for sleep.   Yes Historical Provider, MD    Allergies Adhesive [tape]  Family History  Problem Relation Age of Onset  . Stroke Mother   . CVA Mother   . Hypertension Mother   . Hypertension Father   . Hypertension Sister   . Diabetes Sister   . Cancer Sister 28    Breast  . Stroke Brother   . Hypertension Brother   . Breast cancer Maternal Aunt 70    Social History Social History  Substance Use Topics  . Smoking status: Never Smoker  . Smokeless tobacco: Never Used  . Alcohol use No    Review of Systems Constitutional: No fever/chills Eyes: No visual changes. ENT: No sore throat. Cardiovascular: Positive for chest pain. Respiratory: Positive for shortness of breath. Gastrointestinal: No abdominal pain.  No nausea, no vomiting.  No diarrhea.  No constipation. Genitourinary: Negative for dysuria. Musculoskeletal: Negative for back pain. Skin: Negative for rash. Neurological:  Negative for headaches, focal weakness or numbness.  10-point ROS otherwise negative.  ____________________________________________   PHYSICAL EXAM:  VITAL SIGNS: ED Triage Vitals  Enc Vitals Group     BP 08/31/15 0007 (!) 152/135     Pulse Rate 08/31/15 0007 (!) 143     Resp 08/31/15 0007 18     Temp 08/31/15 0007 99.2 F (37.3 C)     Temp Source 08/31/15 0007 Oral     SpO2 08/31/15 0007 98 %     Weight 08/31/15 0008 150 lb (68 kg)     Height 08/31/15 0008 5\' 6"  (1.676 m)     Head Circumference --      Peak Flow --      Pain Score 08/31/15 0017 10     Pain Loc --      Pain Edu? --      Excl. in [place]? --     Constitutional: Alert and oriented. Well appearing and in no  acute distress. Eyes: Conjunctivae are normal. PERRL. EOMI. Head: Atraumatic. Ears:  Healthy appearing ear canals and TMs bilaterally Nose: No congestion/rhinnorhea. Mouth/Throat: Mucous membranes are moist.  Oropharynx non-erythematous. Neck: No stridor.  No meningeal signs.   Cardiovascular: Tachycardia, irregular rhythm Good peripheral circulation. Grossly normal heart sounds. Respiratory: Normal respiratory effort.  No retractions. Lungs CTAB. Gastrointestinal: Soft and nontender. No distention.  Genitourinary:  Musculoskeletal: No lower extremity tenderness nor edema. No gross deformities of extremities. Neurologic:  Normal speech and language. No gross focal neurologic deficits are appreciated.  Skin:  Skin is warm, dry and intact. No rash noted.   ____________________________________________   LABS (all labs ordered are listed, but only abnormal results are displayed)  Labs Reviewed  BASIC METABOLIC PANEL - Abnormal; Notable for the following:       Result Value   Creatinine, Ser 4.62 (*)    GFR calc non Af Amer 9 (*)    GFR calc Af Amer 11 (*)    All other components within normal limits  CBC - Abnormal; Notable for the following:    RBC 2.67 (*)    Hemoglobin 7.9 (*)    HCT 23.0 (*)     RDW 15.0 (*)    Platelets 148 (*)    All other components within normal limits  TROPONIN I - Abnormal; Notable for the following:    Troponin I 0.03 (*)    All other components within normal limits  PROTIME-INR - Abnormal; Notable for the following:    Prothrombin Time 26.8 (*)    All other components within normal limits   ____________________________________________  EKG  ED ECG REPORT I, [place] N BROWN, the attending physician, personally viewed and interpreted this ECG.   Date: 08/31/2015  EKG Time: 12:14 AM  Rate:123  Rhythm: Sinus tachycardia with frequent premature ventricular contraction Axis: Normal  Intervals: Normal  ST&T Change: None  ____________________________________________  RADIOLOGY I, [place] N BROWN, personally viewed and evaluated these images (plain radiographs) as part of my medical decision making, as well as reviewing the written report by the radiologist.  Dg Chest 2 View  Result Date: 08/31/2015 CLINICAL DATA:  60 y/o F; chest pain and tachycardia since this morning. EXAM: CHEST  2 VIEW COMPARISON:  Chest radiograph 08/27/2015 FINDINGS: Stable cardiomediastinal silhouette given positioning and technique. Aortic arch calcifications. Left port catheter with tip projecting over lower SVC and right dialysis catheter unchanged in position. No consolidation, pneumothorax, or pleural effusion. No acute osseous abnormality is evident. Exaggerated thoracic kyphosis with moderate degenerative changes of the thoracic spine. IMPRESSION: No active cardiopulmonary disease. Electronically Signed   By: Kristine Garbe M.D.   On: 08/31/2015 00:54   Ct Angio Chest Pe W And/or Wo Contrast  Result Date: 08/31/2015 CLINICAL DATA:  Chest pain, dyspnea, tachycardia. History of DVT. Recent hospital admission for sepsis, discharged yesterday. EXAM: CT ANGIOGRAPHY CHEST WITH CONTRAST TECHNIQUE: Multidetector CT imaging of the chest was performed using the standard  protocol during bolus administration of intravenous contrast. Multiplanar CT image reconstructions and MIPs were obtained to evaluate the vascular anatomy. CONTRAST:  75 cc Isovue 370 IV COMPARISON:  Radiographs earlier this day.  Chest CT 05/09/2013 FINDINGS: Cardiovascular: There are no filling defects within the pulmonary arteries to suggest pulmonary embolus. There is no aortic dissection. 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 Moderate atherosclerosis. Multi chamber cardiomegaly. Scattered coronary artery calcifications. There is a conventional branching pattern from the aortic  arch. Mediastinum/Nodes: Right internal jugular dialysis catheter. Left chest port. No mediastinal or hilar adenopathy. No pericardial effusion. No mediastinal mass. The esophagus is decompressed. Lungs/Pleura: No consolidation, pulmonary edema, or focal airspace opacity. No pulmonary mass or suspicious nodule. Trace pleural fluid or thickening posteriorly. Upper Abdomen: No acute abnormality. Musculoskeletal: There are no acute osseous abnormalities. Diffusely increased bone density consistent with renal osteodystrophy. Exaggerated thoracic kyphosis and scoliosis. Review of the MIP images confirms the above findings. IMPRESSION: 1. No pulmonary embolus. 2. No aortic dissection. There is atherosclerosis of the thoracic aorta with aneurysmal dilatation of the ascending aorta, maximal dimension 4.3 cm. This is unchanged from prior CT, however recommend follow-up. Recommend annual imaging followup by CTA or MRA. This recommendation follows 2010 ACCF/AHA/AATS/ACR/ASA/SCA/SCAI/SIR/STS/SVM Guidelines for the Diagnosis and Management of Patients with Thoracic Aortic Disease. Circulation. 2010; 121: [id number] 3. Coronary artery calcifications. Electronically Signed   By: Jeb Levering M.D.   On: 08/31/2015 02:05      Procedures     INITIAL IMPRESSION / ASSESSMENT AND PLAN / ED  COURSE  Pertinent labs & imaging results that were available during my care of the patient were reviewed by me and considered in my medical decision making (see chart for details).  Patient given IV morphine 4 mg with improvement of pain to 6 out of 10. Given history and concern for possible pulmonary emboli CT scan of the chest performed which revealed no evidence of PE. Patient's heart rate improved however she remained tachycardic with ongoing chest pain as such patient was discussed with Dr. Marcille Blanco for hospital admission for further evaluation and management   Clinical Course    ____________________________________________  FINAL CLINICAL IMPRESSION(S) / ED DIAGNOSES  Final diagnoses:  Chest pain, unspecified chest pain type  Tachycardia     MEDICATIONS GIVEN DURING THIS VISIT:  Medications  ondansetron (ZOFRAN) injection 4 mg (4 mg Intravenous Given 08/31/15 0102)  morphine 4 MG/ML injection 4 mg (4 mg Intravenous Given 08/31/15 0102)  iopamidol (ISOVUE-370) 76 % injection 75 mL (75 mLs Intravenous Contrast Given 08/31/15 0136)     NEW OUTPATIENT MEDICATIONS STARTED DURING THIS VISIT:  New Prescriptions   No medications on file      Note:  This document was prepared using Dragon voice recognition software and may include unintentional dictation errors.    Gregor Hams, MD 08/31/15 [phone]

## 2015-08-31 NOTE — Progress Notes (Signed)
[facility] [place] NAME: Teresa Cook    MR#:  [id number]  DATE OF BIRTH:  November 24, 1955  SUBJECTIVE:  CHIEF COMPLAINT:   Chief Complaint  Patient presents with  . Tachycardia  . Chest Pain    Still has chest pain with some nausea and burning. No SOB. CT chest showed PE  REVIEW OF SYSTEMS:    Review of Systems  Constitutional: Positive for malaise/fatigue. Negative for chills and fever.  HENT: Negative for sore throat.   Eyes: Negative for blurred vision, double vision and pain.  Respiratory: Negative for cough, hemoptysis, shortness of breath and wheezing.   Cardiovascular: Positive for chest pain. Negative for palpitations, orthopnea and leg swelling.  Gastrointestinal: Positive for heartburn and nausea. Negative for abdominal pain, constipation, diarrhea and vomiting.  Genitourinary: Negative for dysuria and hematuria.  Musculoskeletal: Negative for back pain and joint pain.  Skin: Negative for rash.  Neurological: Negative for sensory change, speech change, focal weakness and headaches.  Endo/Heme/Allergies: Does not bruise/bleed easily.  Psychiatric/Behavioral: Negative for depression. The patient is not nervous/anxious.     DRUG ALLERGIES:   Allergies  Allergen Reactions  . Adhesive [Tape] Itching    Paper tape is ok to use.    VITALS:  Blood pressure 122/70, pulse 87, temperature 98.1 F (36.7 C), temperature source Oral, resp. rate 15, height 5\' 6"  (1.676 m), weight 68.3 kg (150 lb 9.6 oz), SpO2 92 %.  PHYSICAL EXAMINATION:   Physical Exam  GENERAL:  60 y.o.-year-old patient lying in the bed with no acute distress.  EYES: Pupils equal, round, reactive to light and accommodation. No scleral icterus. Extraocular muscles intact.  HEENT: Head atraumatic, normocephalic. Oropharynx and nasopharynx clear.  NECK:  Supple, no jugular venous distention. No thyroid enlargement, no tenderness.  LUNGS: Normal  breath sounds bilaterally, no wheezing, rales, rhonchi. No use of accessory muscles of respiration.  CARDIOVASCULAR: S1, S2 normal. No murmurs, rubs, or gallops. Pleuritic chest pain. ABDOMEN: Soft, nontender, nondistended. Bowel sounds present. No organomegaly or mass.  EXTREMITIES: No cyanosis, clubbing or edema b/l.    NEUROLOGIC: Cranial nerves II through XII are intact. No focal Motor or sensory deficits b/l.   PSYCHIATRIC: The patient is alert and oriented x 3.  SKIN: No obvious rash, lesion, or ulcer.   LABORATORY PANEL:   CBC  Recent Labs Lab 08/31/15 0100  WBC 7.3  HGB 7.9*  HCT 23.0*  PLT 148*   ------------------------------------------------------------------------------------------------------------------ Chemistries   Recent Labs Lab 08/27/15 0007  08/31/15 0100  NA 138  < > 141  K 4.5  < > 3.7  CL 105  < > 103  CO2 22  < > 29  GLUCOSE 108*  < > 97  BUN 45*  < > 16  CREATININE 7.36*  < > 4.62*  CALCIUM 7.4*  < > 9.0  AST 20  --   --   ALT 6*  --   --   ALKPHOS 569*  --   --   BILITOT 0.3  --   --   < > = values in this interval not displayed. ------------------------------------------------------------------------------------------------------------------  Cardiac Enzymes  Recent Labs Lab 08/31/15 1619  TROPONINI 0.05*   ------------------------------------------------------------------------------------------------------------------  RADIOLOGY:  Dg Chest 2 View  Result Date: 08/31/2015 CLINICAL DATA:  60 y/o F; chest pain and tachycardia since this morning. EXAM: CHEST  2 VIEW COMPARISON:  Chest radiograph 08/27/2015 FINDINGS: Stable cardiomediastinal silhouette given positioning and technique. Aortic arch  calcifications. Left port catheter with tip projecting over lower SVC and right dialysis catheter unchanged in position. No consolidation, pneumothorax, or pleural effusion. No acute osseous abnormality is evident. Exaggerated thoracic kyphosis  with moderate degenerative changes of the thoracic spine. IMPRESSION: No active cardiopulmonary disease. Electronically Signed   By: Kristine Garbe M.D.   On: 08/31/2015 00:54   Ct Angio Chest Pe W And/or Wo Contrast  Result Date: 08/31/2015 CLINICAL DATA:  Chest pain, dyspnea, tachycardia. History of DVT. Recent hospital admission for sepsis, discharged yesterday. EXAM: CT ANGIOGRAPHY CHEST WITH CONTRAST TECHNIQUE: Multidetector CT imaging of the chest was performed using the standard protocol during bolus administration of intravenous contrast. Multiplanar CT image reconstructions and MIPs were obtained to evaluate the vascular anatomy. CONTRAST:  75 cc Isovue 370 IV COMPARISON:  Radiographs earlier this day.  Chest CT 05/09/2013 FINDINGS: Cardiovascular: There are no filling defects within the pulmonary arteries to suggest pulmonary embolus. There is no aortic dissection. 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 Moderate atherosclerosis. Multi chamber cardiomegaly. Scattered coronary artery calcifications. There is a conventional branching pattern from the aortic arch. Mediastinum/Nodes: Right internal jugular dialysis catheter. Left chest port. No mediastinal or hilar adenopathy. No pericardial effusion. No mediastinal mass. The esophagus is decompressed. Lungs/Pleura: No consolidation, pulmonary edema, or focal airspace opacity. No pulmonary mass or suspicious nodule. Trace pleural fluid or thickening posteriorly. Upper Abdomen: No acute abnormality. Musculoskeletal: There are no acute osseous abnormalities. Diffusely increased bone density consistent with renal osteodystrophy. Exaggerated thoracic kyphosis and scoliosis. Review of the MIP images confirms the above findings. IMPRESSION: 1. No pulmonary embolus. 2. No aortic dissection. There is atherosclerosis of the thoracic aorta with aneurysmal dilatation of the ascending aorta, maximal  dimension 4.3 cm. This is unchanged from prior CT, however recommend follow-up. Recommend annual imaging followup by CTA or MRA. This recommendation follows 2010 ACCF/AHA/AATS/ACR/ASA/SCA/SCAI/SIR/STS/SVM Guidelines for the Diagnosis and Management of Patients with Thoracic Aortic Disease. Circulation. 2010; 121: [id number] 3. Coronary artery calcifications. Electronically Signed   By: Jeb Levering M.D.   On: 08/31/2015 02:05     ASSESSMENT AND PLAN:   * Chest pain most likely due to GERD Troponin mildly up but insignificant. Increase PPI to BID. Stress test in AM  * ESRD on HD Nephrology consulted. Due for HD tomorrow  * Fever Mild temp of 100.4 On levaquin. Continue. Recent blood cx negative.  * DM SSI  * Breast cancer Undergoing chemo at cancer center  * h/o DVT On warfarin  All the records are reviewed and case discussed with Care Management/Social Workerr. Management plans discussed with the patient, family and they are in agreement.  CODE STATUS: FULL CODE  DVT Prophylaxis: SCDs  TOTAL TIME TAKING CARE OF THIS PATIENT: 30 minutes.   POSSIBLE D/C IN 1-2 DAYS, DEPENDING ON CLINICAL CONDITION.  Hillary Bow R M.D on 08/31/2015 at 7:59 PM  Between 7am to 6pm - Pager - [phone]  After 6pm go to [email or web address] - password [email or web address] [facility]  Office  [phone]  CC: Primary care physician; Murlean Iba, MD  Note: This dictation was prepared with Dragon dictation along with smaller phrase technology. Any transcriptional errors that result from this process are unintentional.

## 2015-08-31 NOTE — Progress Notes (Signed)
[facility]  ROUNDING NOTE   Subjective:   Admitted on 08/31/2015 for Tachycardia [R00.0] Chest pain, unspecified chest pain type [R07.9]   Was just discharged on 8/22. Received outpatient hemodialysis treatment yesterday without incident. And now admitted with fever  Objective:  Vital signs in last 24 hours:  Temp:  [98.3 F (36.8 C)-100.6 F (38.1 C)] 98.5 F (36.9 C) (08/24 1209) Pulse Rate:  [99-143] 99 (08/24 1209) Resp:  [13-29] 19 (08/24 1209) BP: (105-185)/(60-135) 105/60 (08/24 1209) SpO2:  [90 %-100 %] 96 % (08/24 1209) Weight:  [68 kg (150 lb)-68.3 kg (150 lb 9.6 oz)] 68.3 kg (150 lb 9.6 oz) (08/24 0441)  Weight change:  Filed Weights   08/31/15 0008 08/31/15 0441  Weight: 68 kg (150 lb) 68.3 kg (150 lb 9.6 oz)    Intake/Output: No intake/output data recorded.   Intake/Output this shift:  No intake/output data recorded.  Physical Exam: General: No acute distress  Head: Normocephalic, atraumatic. Moist oral mucosal membranes  Eyes: Anicteric  Neck: Supple, trachea midline  Lungs:  Clear to auscultation, normal effort  Heart: S1S2 no rubs  Abdomen:  Soft, nontender, bowel sounds present  Extremities: No peripheral edema.  Neurologic: Nonfocal, moving all four extremities  Skin: No lesions  Access: R IJ permcath    Basic Metabolic Panel:  Recent Labs Lab 08/27/15 0007 08/27/15 0553 08/29/15 0056 08/31/15 0100  NA 138 137  --  141  K 4.5 5.2*  --  3.7  CL 105 105  --  103  CO2 22 21*  --  29  GLUCOSE 108* 108*  --  97  BUN 45* 52*  --  16  CREATININE 7.36* 7.75*  --  4.62*  CALCIUM 7.4* 8.0*  --  9.0  PHOS  --   --  3.6  --     Liver Function Tests:  Recent Labs Lab 08/27/15 0007  AST 20  ALT 6*  ALKPHOS 569*  BILITOT 0.3  PROT 6.4*  ALBUMIN 3.3*   No results for input(s): LIPASE, AMYLASE in the last 168 hours. No results for input(s): AMMONIA in the last 168 hours.  CBC:  Recent Labs Lab 08/27/15 0007  08/27/15 0553 08/31/15 0100  WBC 8.8 9.0 7.3  NEUTROABS 7.8*  --   --   HGB 9.6* 7.8* 7.9*  HCT 28.1* 23.2* 23.0*  MCV 86.7 86.0 86.1  PLT 134* 114* 148*    Cardiac Enzymes:  Recent Labs Lab 08/27/15 0007 08/31/15 0100 08/31/15 0437 08/31/15 1027  TROPONINI 0.03* 0.03* 0.09* 0.03*    BNP: Invalid input(s): POCBNP  CBG: No results for input(s): GLUCAP in the last 168 hours.  Microbiology: Results for orders placed or performed during the hospital encounter of 08/26/15  Blood Culture (routine x 2)     Status: None (Preliminary result)   Collection Time: 08/27/15 12:41 AM  Result Value Ref Range Status   Specimen Description BLOOD RIGHT ASSIST CONTROL  Final   Special Requests BOTTLES DRAWN AEROBIC AND ANAEROBIC [place]  Final   Culture NO GROWTH 4 DAYS  Final   Report Status PENDING  Incomplete  Blood Culture (routine x 2)     Status: None (Preliminary result)   Collection Time: 08/27/15 12:41 AM  Result Value Ref Range Status   Specimen Description BLOOD LEFT ASSIST CONTROL  Final   Special Requests   Final    BOTTLES DRAWN AEROBIC AND ANAEROBIC 12CCAERO,7CCANA   Culture NO GROWTH 4 DAYS  Final  Report Status PENDING  Incomplete  Urine culture     Status: None   Collection Time: 08/27/15  2:00 AM  Result Value Ref Range Status   Specimen Description URINE, RANDOM  Final   Special Requests NONE  Final   Culture NO GROWTH Performed at [facility]   Final   Report Status 08/28/2015 FINAL  Final  MRSA PCR Screening     Status: None   Collection Time: 08/27/15  9:37 AM  Result Value Ref Range Status   MRSA by PCR NEGATIVE NEGATIVE Final    Comment:        The GeneXpert MRSA Assay (FDA approved for NASAL specimens only), is one component of a comprehensive MRSA colonization surveillance program. It is not intended to diagnose MRSA infection nor to guide or monitor treatment for MRSA infections.     Coagulation Studies:  Recent Labs   08/29/15 0056 08/31/15 0101  LABPROT 33.0* 26.8*  INR 3.14 2.42    Urinalysis: No results for input(s): COLORURINE, LABSPEC, PHURINE, GLUCOSEU, HGBUR, BILIRUBINUR, KETONESUR, PROTEINUR, UROBILINOGEN, NITRITE, LEUKOCYTESUR in the last 72 hours.  Invalid input(s): APPERANCEUR    Imaging: Dg Chest 2 View  Result Date: 08/31/2015 CLINICAL DATA:  60 y/o F; chest pain and tachycardia since this morning. EXAM: CHEST  2 VIEW COMPARISON:  Chest radiograph 08/27/2015 FINDINGS: Stable cardiomediastinal silhouette given positioning and technique. Aortic arch calcifications. Left port catheter with tip projecting over lower SVC and right dialysis catheter unchanged in position. No consolidation, pneumothorax, or pleural effusion. No acute osseous abnormality is evident. Exaggerated thoracic kyphosis with moderate degenerative changes of the thoracic spine. IMPRESSION: No active cardiopulmonary disease. Electronically Signed   By: Kristine Garbe M.D.   On: 08/31/2015 00:54   Ct Angio Chest Pe W And/or Wo Contrast  Result Date: 08/31/2015 CLINICAL DATA:  Chest pain, dyspnea, tachycardia. History of DVT. Recent hospital admission for sepsis, discharged yesterday. EXAM: CT ANGIOGRAPHY CHEST WITH CONTRAST TECHNIQUE: Multidetector CT imaging of the chest was performed using the standard protocol during bolus administration of intravenous contrast. Multiplanar CT image reconstructions and MIPs were obtained to evaluate the vascular anatomy. CONTRAST:  75 cc Isovue 370 IV COMPARISON:  Radiographs earlier this day.  Chest CT 05/09/2013 FINDINGS: Cardiovascular: There are no filling defects within the pulmonary arteries to suggest pulmonary embolus. There is no aortic dissection. 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 Moderate atherosclerosis. Multi chamber cardiomegaly. Scattered coronary artery calcifications. There is a conventional branching pattern  from the aortic arch. Mediastinum/Nodes: Right internal jugular dialysis catheter. Left chest port. No mediastinal or hilar adenopathy. No pericardial effusion. No mediastinal mass. The esophagus is decompressed. Lungs/Pleura: No consolidation, pulmonary edema, or focal airspace opacity. No pulmonary mass or suspicious nodule. Trace pleural fluid or thickening posteriorly. Upper Abdomen: No acute abnormality. Musculoskeletal: There are no acute osseous abnormalities. Diffusely increased bone density consistent with renal osteodystrophy. Exaggerated thoracic kyphosis and scoliosis. Review of the MIP images confirms the above findings. IMPRESSION: 1. No pulmonary embolus. 2. No aortic dissection. There is atherosclerosis of the thoracic aorta with aneurysmal dilatation of the ascending aorta, maximal dimension 4.3 cm. This is unchanged from prior CT, however recommend follow-up. Recommend annual imaging followup by CTA or MRA. This recommendation follows 2010 ACCF/AHA/AATS/ACR/ASA/SCA/SCAI/SIR/STS/SVM Guidelines for the Diagnosis and Management of Patients with Thoracic Aortic Disease. Circulation. 2010; 121: [id number] 3. Coronary artery calcifications. Electronically Signed   By: Fonnie Birkenhead.D.  On: 08/31/2015 02:05     Medications:     . aspirin EC  81 mg Oral Daily  . cinacalcet  120 mg Oral Daily  . clonazePAM  0.5-1 mg Oral Daily  . docusate sodium  100 mg Oral BID  . levofloxacin  250 mg Oral Q48H  . losartan  50 mg Oral Once per day on Sun Tue Thu Sat  . metoprolol tartrate  50 mg Oral 2 times per day on Sun Tue Thu Sat  . pantoprazole  40 mg Oral BID AC  . sertraline  100 mg Oral QHS  . sevelamer carbonate  2,400 mg Oral TID WC  . sodium chloride flush  3 mL Intravenous Q12H  . [START ON 09/01/2015] warfarin  5 mg Oral Once per day on Sun Fri Sat  . warfarin  7.5 mg Oral Once per day on Mon Tue Wed Thu   acetaminophen **OR** acetaminophen, HYDROcodone-acetaminophen, ondansetron,  prochlorperazine, traMADol, zolpidem  Assessment/ Plan:  60 y.o. black female ESRD on HD MWF, hypertension, breast cancer, gout, history of DVT, psoriasis who presented to [facility] with fevers, chills, status post fall.  [facility]/MWF/N. [place].  1. ESRD on HD MWF: Last dialysis yesterday. No acute indication for dialysis. Next treatment for tomorrow.   2. Anemia of chronic kidney disease.  - Avoid Epogen given recent diagnosis of breast cancer.  3. Secondary hyperparathyroidism. -  Continue cinacalcet and Renvela   4. Hypertension:  - Continue losartan and metoprolol.   LOS: 0 Teresa Cook 8/24/20171:41 PM

## 2015-08-31 NOTE — Progress Notes (Signed)
Arrival Method:  Via stretcher with ED RN Mental Orientation: A&O Telemetry: [id number] Skin: intact, verified by crystal B, RN IV: 20g left arm, HD cath to right chest-double lumen, left chest port-unaccessed Pain: chest pain/pressure/squeezing rating it at a 8 out of 10, will give pain medication for this Tubes: none Safety Measures: Safety Fall Prevention Plan has been given, discussed & signed, non skid socks in place. 2A Orientation: Patient has been orientated to the room, unit & staff.   Orders have been reviewed & implemented. Will continue to monitor the patient. Call light has been placed within reach.  Georgian Co, RN

## 2015-08-31 NOTE — ED Triage Notes (Signed)
Pt presents to ED with c/o chest pain and tachycardia since this morning. Pt reports lightheadedness, denies nausea or vomiting. Pt was discharged yesterday from hospital, pt was admitted for sepsis and shaking chills. Pt has port a cath to left side of chest and PICC line on right side of chest. Pt is a breast cancer pt.

## 2015-08-31 NOTE — Progress Notes (Signed)
ANTICOAGULATION CONSULT NOTE - Initial Consult  Pharmacy Consult for warfarin dosing Indication: VTE treatment  Allergies  Allergen Reactions  . Adhesive [Tape] Itching    Paper tape is ok to use.    Patient Measurements: Height: 5\' 6"  (167.6 cm) Weight: 150 lb 9.6 oz (68.3 kg) IBW/kg (Calculated) : 59.3 Heparin Dosing Weight: n/a  Vital Signs: Temp: 100.6 F (38.1 C) (08/24 0433) Temp Source: Oral (08/24 0433) BP: 169/93 (08/24 0433) Pulse Rate: 110 (08/24 0433)  Labs:  Recent Labs  08/28/15 0551 08/29/15 0056 08/31/15 0100 08/31/15 0101  HGB  --   --  7.9*  --   HCT  --   --  23.0*  --   PLT  --   --  148*  --   LABPROT 29.5* 33.0*  --  26.8*  INR 2.73 3.14  --  2.42  CREATININE  --   --  4.62*  --   TROPONINI  --   --  0.03*  --     Estimated Creatinine Clearance: 12 mL/min (by C-G formula based on SCr of 4.62 mg/dL).   Medical History: Past Medical History:  Diagnosis Date  . Anemia   . Anxiety   . Breast cancer ([place])   . CHF (congestive heart failure) ([place])   . Chronic kidney disease   . Depression   . Dialysis patient ([place])   . DVT (deep venous thrombosis) (H[facility])    left leg  . DVT (deep venous thrombosis) ([place])   . Gout   . HTN (hypertension)   . Hypertension   . Parathyroid abnormality ([place])   . Parathyroid disease ([place])   . Psoriasis   . Renal insufficiency   . Sickle cell anemia ([facility])    traits    Medications:    Assessment:  Goal of Therapy:  INR 2-3    Plan:  Home dose of 7.5 mg M, T, W, Th and 5 mg F, S, . ordered. INR daily while on antibiotics.  Jazmina Muhlenkamp S 08/31/2015,5:11 AM

## 2015-08-31 NOTE — ED Notes (Signed)
Pt taken to CT via stretcher.

## 2015-08-31 NOTE — ED Notes (Addendum)
Dr. Owens Shark at bedside.   Pt states she has been on dialysis x  3 years and just started chemo Thursday. Stated she felt fine. Pt stating central chest pain, SOB, weakness that began today. Pt placed on cardiac monitor and HR 120 with PVC's.

## 2015-08-31 NOTE — H&P (Signed)
Teresa Cook is an 60 y.o. female.   Chief Complaint: Chest pain HPI: The patient with past medical history significant for end-stage renal disease on dialysis as well as chronic congestive heart failure presents emergency department complaining of chest pain. She states that her pain has been waxing and waning over the last 3-4 days which is up and a recent hospitalization. The patient was actually discharged from the hospital on the morning of this current admission. She went to dialysis as usual and was able to tolerate without complication. At home she continued to have chest pain which seemed to grow and severity. She continued to have chest pain in the emergency department which prompted the emergency department staff to call for admission.  Past Medical History:  Diagnosis Date  . Anemia   . Anxiety   . Breast cancer ([place])   . CHF (congestive heart failure) ([place])   . Chronic kidney disease   . Depression   . Dialysis patient ([place])   . DVT (deep venous thrombosis) (H[facility])    left leg  . DVT (deep venous thrombosis) ([place])   . Gout   . HTN (hypertension)   . Hypertension   . Parathyroid abnormality ([place])   . Parathyroid disease ([place])   . Psoriasis   . Renal insufficiency   . Sickle cell anemia (H[facility])    traits    Past Surgical History:  Procedure Laterality Date  . ABDOMINAL HYSTERECTOMY    . APPENDECTOMY    . BREAST BIOPSY Left 10/28/2013   benign  . BREAST EXCISIONAL BIOPSY Left 2002   benign  . INSERTION OF DIALYSIS CATHETER  2014  . PARTIAL HYSTERECTOMY    . PORTACATH PLACEMENT Left 08/21/2015   Procedure: INSERTION PORT-A-CATH;  Surgeon: Hubbard Robinson, MD;  Location: [facility] ORS;  Service: General;  Laterality: Left;    Family History  Problem Relation Age of Onset  . Stroke Mother   . CVA Mother   . Hypertension Mother   . Hypertension Father   . Hypertension Sister   . Diabetes Sister   . Cancer Sister 8    Breast  . Stroke Brother   . Hypertension  Brother   . Breast cancer Maternal Aunt 70   Social History:  reports that she has never smoked. She has never used smokeless tobacco. She reports that she does not drink alcohol or use drugs.  Allergies:  Allergies  Allergen Reactions  . Adhesive [Tape] Itching    Paper tape is ok to use.    Medications Prior to Admission  Medication Sig Dispense Refill  . clonazePAM (KLONOPIN) 1 MG tablet Take 0.5-1 mg by mouth daily.     Marland Kitchen HYDROcodone-acetaminophen (NORCO/VICODIN) 5-325 MG tablet Take 1-2 tablets by mouth every 4 (four) hours as needed for moderate pain or severe pain. 20 tablet 0  . levofloxacin (LEVAQUIN) 250 MG tablet Take 1 tablet (250 mg total) by mouth every other day. 3 tablet 0  . lidocaine-prilocaine (EMLA) cream Apply 1 application topically as needed. 30 g 2  . losartan (COZAAR) 50 MG tablet Take 100 mg by mouth daily. Takes on non-dialysis days=Tuesday, Thursday, Saturday and Sunday.    . metoprolol (LOPRESSOR) 100 MG tablet Take 50 mg by mouth 2 (two) times daily. Takes on non-dialysis days=Tuesday, Thursday, Saturday and Sunday.    . ondansetron (ZOFRAN) 8 MG tablet Take 1 tablet (8 mg total) by mouth every 8 (eight) hours as needed for nausea or vomiting. 30 tablet 2  .  pantoprazole (PROTONIX) 20 MG tablet Take 20 mg by mouth daily.     . prochlorperazine (COMPAZINE) 10 MG tablet Take 1 tablet (10 mg total) by mouth every 6 (six) hours as needed for nausea or vomiting. 30 tablet 2  . SENSIPAR 60 MG tablet Take 120 mg by mouth daily.     . sertraline (ZOLOFT) 100 MG tablet Take 100 mg by mouth at bedtime.     . sevelamer carbonate (RENVELA) 800 MG tablet Take 2,400 mg by mouth 3 (three) times daily with meals.     . traMADol (ULTRAM) 50 MG tablet Take 50 mg by mouth every 6 (six) hours as needed.     . warfarin (COUMADIN) 5 MG tablet Take 1 tablet Friday, Saturday and Sundays. Take 1 1/2 tablets the other days of the week    . zolpidem (AMBIEN) 10 MG tablet Take 10 mg by  mouth at bedtime as needed for sleep.      Results for orders placed or performed during the hospital encounter of 08/31/15 (from the past 48 hour(s))  Basic metabolic panel     Status: Abnormal   Collection Time: 08/31/15  1:00 AM  Result Value Ref Range   Sodium 141 135 - 145 mmol/L   Potassium 3.7 3.5 - 5.1 mmol/L   Chloride 103 101 - 111 mmol/L   CO2 29 22 - 32 mmol/L   Glucose, Bld 97 65 - 99 mg/dL   BUN 16 6 - 20 mg/dL   Creatinine, Ser 4.62 (H) 0.44 - 1.00 mg/dL   Calcium 9.0 8.9 - 10.3 mg/dL   GFR calc non Af Amer 9 (L) >60 mL/min   GFR calc Af Amer 11 (L) >60 mL/min    Comment: (NOTE) The eGFR has been calculated using the CKD EPI equation. This calculation has not been validated in all clinical situations. eGFR's persistently <60 mL/min signify possible Chronic Kidney Disease.    Anion gap 9 5 - 15  CBC     Status: Abnormal   Collection Time: 08/31/15  1:00 AM  Result Value Ref Range   WBC 7.3 3.6 - 11.0 K/uL   RBC 2.67 (L) 3.80 - 5.20 MIL/uL   Hemoglobin 7.9 (L) 12.0 - 16.0 g/dL   HCT 23.0 (L) 35.0 - 47.0 %   MCV 86.1 80.0 - 100.0 fL   MCH 29.6 26.0 - 34.0 pg   MCHC 34.4 32.0 - 36.0 g/dL   RDW 15.0 (H) 11.5 - 14.5 %   Platelets 148 (L) 150 - 440 K/uL  Troponin I     Status: Abnormal   Collection Time: 08/31/15  1:00 AM  Result Value Ref Range   Troponin I 0.03 (HH) <0.03 ng/mL    Comment: CRITICAL RESULT CALLED TO, READ BACK BY AND VERIFIED WITH KATE BUMGARNER @ 7408 ON 08/31/2015 BY CAF   Protime-INR     Status: Abnormal   Collection Time: 08/31/15  1:01 AM  Result Value Ref Range   Prothrombin Time 26.8 (H) 11.4 - 15.2 seconds   INR 2.42   TSH     Status: None   Collection Time: 08/31/15  4:37 AM  Result Value Ref Range   TSH 3.399 0.350 - 4.500 uIU/mL  Troponin I     Status: Abnormal   Collection Time: 08/31/15  4:37 AM  Result Value Ref Range   Troponin I 0.09 (HH) <0.03 ng/mL    Comment: CRITICAL RESULT CALLED TO, READ BACK BY AND VERIFIED  WITH TAKERA  NESBITT ON 08/31/15 AT 0616 BY TLB    Dg Chest 2 View  Result Date: 08/31/2015 CLINICAL DATA:  60 y/o F; chest pain and tachycardia since this morning. EXAM: CHEST  2 VIEW COMPARISON:  Chest radiograph 08/27/2015 FINDINGS: Stable cardiomediastinal silhouette given positioning and technique. Aortic arch calcifications. Left port catheter with tip projecting over lower SVC and right dialysis catheter unchanged in position. No consolidation, pneumothorax, or pleural effusion. No acute osseous abnormality is evident. Exaggerated thoracic kyphosis with moderate degenerative changes of the thoracic spine. IMPRESSION: No active cardiopulmonary disease. Electronically Signed   By: Kristine Garbe M.D.   On: 08/31/2015 00:54   Ct Angio Chest Pe W And/or Wo Contrast  Result Date: 08/31/2015 CLINICAL DATA:  Chest pain, dyspnea, tachycardia. History of DVT. Recent hospital admission for sepsis, discharged yesterday. EXAM: CT ANGIOGRAPHY CHEST WITH CONTRAST TECHNIQUE: Multidetector CT imaging of the chest was performed using the standard protocol during bolus administration of intravenous contrast. Multiplanar CT image reconstructions and MIPs were obtained to evaluate the vascular anatomy. CONTRAST:  75 cc Isovue 370 IV COMPARISON:  Radiographs earlier this day.  Chest CT 05/09/2013 FINDINGS: Cardiovascular: There are no filling defects within the pulmonary arteries to suggest pulmonary embolus. There is no aortic dissection. 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 Moderate atherosclerosis. Multi chamber cardiomegaly. Scattered coronary artery calcifications. There is a conventional branching pattern from the aortic arch. Mediastinum/Nodes: Right internal jugular dialysis catheter. Left chest port. No mediastinal or hilar adenopathy. No pericardial effusion. No mediastinal mass. The esophagus is decompressed. Lungs/Pleura: No consolidation,  pulmonary edema, or focal airspace opacity. No pulmonary mass or suspicious nodule. Trace pleural fluid or thickening posteriorly. Upper Abdomen: No acute abnormality. Musculoskeletal: There are no acute osseous abnormalities. Diffusely increased bone density consistent with renal osteodystrophy. Exaggerated thoracic kyphosis and scoliosis. Review of the MIP images confirms the above findings. IMPRESSION: 1. No pulmonary embolus. 2. No aortic dissection. There is atherosclerosis of the thoracic aorta with aneurysmal dilatation of the ascending aorta, maximal dimension 4.3 cm. This is unchanged from prior CT, however recommend follow-up. Recommend annual imaging followup by CTA or MRA. This recommendation follows 2010 ACCF/AHA/AATS/ACR/ASA/SCA/SCAI/SIR/STS/SVM Guidelines for the Diagnosis and Management of Patients with Thoracic Aortic Disease. Circulation. 2010; 121: [id number] 3. Coronary artery calcifications. Electronically Signed   By: Jeb Levering M.D.   On: 08/31/2015 02:05    Review of Systems  Constitutional: Negative for chills and fever.  HENT: Negative for sore throat and tinnitus.   Eyes: Negative for blurred vision and redness.  Respiratory: Negative for cough and shortness of breath.   Cardiovascular: Positive for chest pain. Negative for palpitations, orthopnea and PND.  Gastrointestinal: Positive for diarrhea and nausea. Negative for abdominal pain and vomiting.  Genitourinary: Negative for dysuria, frequency and urgency.  Musculoskeletal: Negative for joint pain and myalgias.  Skin: Negative for rash.       No lesions  Neurological: Negative for speech change, focal weakness and weakness.  Endo/Heme/Allergies: Does not bruise/bleed easily.       No temperature intolerance  Psychiatric/Behavioral: Negative for depression and suicidal ideas.    Blood pressure (!) 169/93, pulse (!) 110, temperature (!) 100.6 F (38.1 C), temperature source Oral, resp. rate 20, height '5\' 6"'   (1.676 m), weight 68.3 kg (150 lb 9.6 oz), SpO2 95 %. Physical Exam  Vitals reviewed. Constitutional: She is oriented to person, place, and time. She appears well-developed and well-nourished. No distress.  HENT:  Head: Normocephalic and atraumatic.  Mouth/Throat: Oropharynx is clear and moist.  Eyes: Conjunctivae and EOM are normal. Pupils are equal, round, and reactive to light. No scleral icterus.  Neck: Normal range of motion. Neck supple. No JVD present. No tracheal deviation present. No thyromegaly present.  Cardiovascular: Normal rate, regular rhythm and normal heart sounds.  Exam reveals no gallop and no friction rub.   No murmur heard. Respiratory: Effort normal and breath sounds normal.  GI: Soft. Bowel sounds are normal. She exhibits no distension. There is no tenderness.  Genitourinary:  Genitourinary Comments: Deferred  Musculoskeletal: Normal range of motion. She exhibits no edema.  Lymphadenopathy:    She has no cervical adenopathy.  Neurological: She is alert and oriented to person, place, and time. No cranial nerve deficit. She exhibits normal muscle tone.  Skin: Skin is warm and dry. No rash noted. No erythema.  Psychiatric: She has a normal mood and affect. Her behavior is normal. Judgment and thought content normal.     Assessment/Plan This is a 60 year old female admitted for chest pain. 1. Atypical chest pain: It is unlikely the patient is having acute coronary syndrome at this time. Her troponin is mildly elevated likely secondary to poor renal clearance. I have added Imdur to her regimen. Cardiology consultation at discretion of the primary team. Monitor telemetry. 2. Congestive heart failure: Systolic, chronic. 3. Hypertension: Continue losartan and metoprolol 4. ESRD: On dialysis. Consult nephrology for continuation of treatment 5. History of recurrent DVT: Continue warfarin 6. Renal osteodystrophy: Continue Sensipar and Selevamer. 7. DVT prophylaxis:  Therapeutic anticoagulation as above 8. GI prophylaxis: Pantoprazole per home regimen The patient is a full code. Time spent on admission orders and patient care approximately 45 minutes  Harrie Foreman, MD 08/31/2015, 6:19 AM

## 2015-09-01 ENCOUNTER — Observation Stay: Payer: Medicare Other

## 2015-09-01 ENCOUNTER — Encounter: Payer: Self-pay | Admitting: Radiology

## 2015-09-01 LAB — RENAL FUNCTION PANEL
ALBUMIN: 2.8 g/dL — AB (ref 3.5–5.0)
ANION GAP: 10 (ref 5–15)
BUN: 26 mg/dL — ABNORMAL HIGH (ref 6–20)
CALCIUM: 7.4 mg/dL — AB (ref 8.9–10.3)
CO2: 26 mmol/L (ref 22–32)
Chloride: 99 mmol/L — ABNORMAL LOW (ref 101–111)
Creatinine, Ser: 7.82 mg/dL — ABNORMAL HIGH (ref 0.44–1.00)
GFR calc non Af Amer: 5 mL/min — ABNORMAL LOW (ref >60)
GFR, EST AFRICAN AMERICAN: 6 mL/min — AB (ref >60)
Glucose, Bld: 108 mg/dL — ABNORMAL HIGH (ref 65–99)
PHOSPHORUS: 3.7 mg/dL (ref 2.5–4.6)
Potassium: 4.2 mmol/L (ref 3.5–5.1)
SODIUM: 135 mmol/L (ref 135–145)

## 2015-09-01 LAB — CULTURE, BLOOD (ROUTINE X 2)
CULTURE: NO GROWTH
CULTURE: NO GROWTH

## 2015-09-01 LAB — DIFFERENTIAL
BASOS PCT: 0 %
BLASTS: 0 %
Band Neutrophils: 4 %
Basophils Absolute: 0 10*3/uL (ref 0–0.1)
EOS PCT: 0 %
Eosinophils Absolute: 0 10*3/uL (ref 0–0.7)
Lymphocytes Relative: 10 %
Lymphs Abs: 1.6 10*3/uL (ref 1.0–3.6)
METAMYELOCYTES PCT: 3 %
MYELOCYTES: 3 %
Monocytes Absolute: 1.1 10*3/uL — ABNORMAL HIGH (ref 0.2–0.9)
Monocytes Relative: 7 %
NEUTROS ABS: 13.6 10*3/uL — AB (ref 1.4–6.5)
NEUTROS PCT: 73 %
OTHER: 0 %
Promyelocytes Absolute: 0 %
nRBC: 0 /100 WBC

## 2015-09-01 LAB — NM MYOCAR MULTI W/SPECT W/WALL MOTION / EF
CHL CUP NUCLEAR SDS: 0
CHL CUP NUCLEAR SRS: 5
CSEPPHR: 101 {beats}/min
Exercise duration (min): 1 min
LV dias vol: 140 mL (ref 46–106)
LVSYSVOL: 68 mL
Rest HR: 82 {beats}/min
SSS: 2
TID: 0.96

## 2015-09-01 LAB — CBC
HCT: 20.2 % — ABNORMAL LOW (ref 35.0–47.0)
HEMOGLOBIN: 6.9 g/dL — AB (ref 12.0–16.0)
MCH: 29.3 pg (ref 26.0–34.0)
MCHC: 34.2 g/dL (ref 32.0–36.0)
MCV: 85.6 fL (ref 80.0–100.0)
Platelets: 166 10*3/uL (ref 150–440)
RBC: 2.36 MIL/uL — AB (ref 3.80–5.20)
RDW: 14.7 % — ABNORMAL HIGH (ref 11.5–14.5)
WBC: 16.3 10*3/uL — ABNORMAL HIGH (ref 3.6–11.0)

## 2015-09-01 LAB — ABO/RH: ABO/RH(D): O POS

## 2015-09-01 LAB — PROTIME-INR
INR: 2.37
Prothrombin Time: 26.3 seconds — ABNORMAL HIGH (ref 11.4–15.2)

## 2015-09-01 LAB — MISC LABCORP TEST (SEND OUT): Labcorp test code: 1453

## 2015-09-01 LAB — PREPARE RBC (CROSSMATCH)

## 2015-09-01 MED ORDER — REGADENOSON 0.4 MG/5ML IV SOLN
0.4000 mg | Freq: Once | INTRAVENOUS | Status: AC
Start: 2015-09-01 — End: 2015-09-01
  Administered 2015-09-01: 0.4 mg via INTRAVENOUS
  Filled 2015-09-01: qty 5

## 2015-09-01 MED ORDER — TECHNETIUM TC 99M TETROFOSMIN IV KIT
30.0000 | PACK | Freq: Once | INTRAVENOUS | Status: AC | PRN
  Administered 2015-09-01: 31.78 via INTRAVENOUS

## 2015-09-01 MED ORDER — SODIUM CHLORIDE 0.9 % IV SOLN
Freq: Once | INTRAVENOUS | Status: AC
  Administered 2015-09-01: 22:00:00 via INTRAVENOUS

## 2015-09-01 MED ORDER — TECHNETIUM TC 99M TETROFOSMIN IV KIT
13.0000 | PACK | Freq: Once | INTRAVENOUS | Status: AC | PRN
  Administered 2015-09-01: 13.084 via INTRAVENOUS

## 2015-09-01 NOTE — Progress Notes (Signed)
POST DIALYSIS ASSESSMENT 

## 2015-09-01 NOTE — Progress Notes (Signed)
[facility] NAME: Teresa Cook    MR#:  [id number]  DATE OF BIRTH:  [birth year]  SUBJECTIVE:   Still having some chest pain.  Afebrile. Had stress test today which was (-) for any acute pathology.   REVIEW OF SYSTEMS:    Review of Systems  Constitutional: Negative for chills, fever and malaise/fatigue.  HENT: Negative for sore throat.   Eyes: Negative for blurred vision, double vision and pain.  Respiratory: Negative for cough, hemoptysis, shortness of breath and wheezing.   Cardiovascular: Positive for chest pain. Negative for palpitations, orthopnea and leg swelling.  Gastrointestinal: Negative for abdominal pain, constipation, diarrhea, heartburn, nausea and vomiting.  Genitourinary: Negative for dysuria and hematuria.  Musculoskeletal: Negative for back pain and joint pain.  Skin: Negative for rash.  Neurological: Negative for sensory change, speech change, focal weakness and headaches.  Endo/Heme/Allergies: Does not bruise/bleed easily.  Psychiatric/Behavioral: Negative for depression. The patient is not nervous/anxious.     DRUG ALLERGIES:   Allergies  Allergen Reactions  . Adhesive [Tape] Itching    Paper tape is ok to use.    VITALS:  Blood pressure (!) 150/86, pulse 98, temperature 98.6 F (37 C), temperature source Oral, resp. rate (!) 23, height 5\' 6"  (1.676 m), weight 68.8 kg (151 lb 9.6 oz), SpO2 99 %.  PHYSICAL EXAMINATION:   Physical Exam  GENERAL:  60 y.o.-year-old patient lying in the bed in no acute distress.  EYES: Pupils equal, round, reactive to light and accommodation. No scleral icterus. Extraocular muscles intact.  HEENT: Head atraumatic, normocephalic. Oropharynx and nasopharynx clear.  NECK:  Supple, no jugular venous distention. No thyroid enlargement, no tenderness.  LUNGS: Normal breath sounds bilaterally, no wheezing, rales, rhonchi. No use of accessory muscles of respiration.   CARDIOVASCULAR: S1, S2 normal. No murmurs, rubs, or gallops. Pleuritic chest pain. ABDOMEN: Soft, nontender, nondistended. Bowel sounds present. No organomegaly or mass.  EXTREMITIES: No cyanosis, clubbing or edema b/l.    NEUROLOGIC: Cranial nerves II through XII are intact. No focal Motor or sensory deficits b/l.   PSYCHIATRIC: The patient is alert and oriented x 3.  SKIN: No obvious rash, lesion, or ulcer.   Right chest wall dialysis cath in place.   LABORATORY PANEL:   CBC  Recent Labs Lab 09/01/15 1408  WBC 16.3*  HGB 6.9*  HCT 20.2*  PLT 166   ------------------------------------------------------------------------------------------------------------------ Chemistries   Recent Labs Lab 08/27/15 0007  09/01/15 1408  NA 138  < > 135  K 4.5  < > 4.2  CL 105  < > 99*  CO2 22  < > 26  GLUCOSE 108*  < > 108*  BUN 45*  < > 26*  CREATININE 7.36*  < > 7.82*  CALCIUM 7.4*  < > 7.4*  AST 20  --   --   ALT 6*  --   --   ALKPHOS 569*  --   --   BILITOT 0.3  --   --   < > = values in this interval not displayed. ------------------------------------------------------------------------------------------------------------------  Cardiac Enzymes  Recent Labs Lab 08/31/15 1619  TROPONINI 0.05*   ------------------------------------------------------------------------------------------------------------------  RADIOLOGY:  Dg Chest 2 View  Result Date: 08/31/2015 CLINICAL DATA:  60 y/o F; chest pain and tachycardia since this morning. EXAM: CHEST  2 VIEW COMPARISON:  Chest radiograph 08/27/2015 FINDINGS: Stable cardiomediastinal silhouette given positioning and technique. Aortic arch calcifications. Left port catheter with tip projecting over lower  SVC and right dialysis catheter unchanged in position. No consolidation, pneumothorax, or pleural effusion. No acute osseous abnormality is evident. Exaggerated thoracic kyphosis with moderate degenerative changes of the thoracic  spine. IMPRESSION: No active cardiopulmonary disease. Electronically Signed   By: Kristine Garbe M.D.   On: 08/31/2015 00:54   Ct Angio Chest Pe W And/or Wo Contrast  Result Date: 08/31/2015 CLINICAL DATA:  Chest pain, dyspnea, tachycardia. History of DVT. Recent hospital admission for sepsis, discharged yesterday. EXAM: CT ANGIOGRAPHY CHEST WITH CONTRAST TECHNIQUE: Multidetector CT imaging of the chest was performed using the standard protocol during bolus administration of intravenous contrast. Multiplanar CT image reconstructions and MIPs were obtained to evaluate the vascular anatomy. CONTRAST:  75 cc Isovue 370 IV COMPARISON:  Radiographs earlier this day.  Chest CT 05/09/2013 FINDINGS: Cardiovascular: There are no filling defects within the pulmonary arteries to suggest pulmonary embolus. There is no aortic dissection. 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 Moderate atherosclerosis. Multi chamber cardiomegaly. Scattered coronary artery calcifications. There is a conventional branching pattern from the aortic arch. Mediastinum/Nodes: Right internal jugular dialysis catheter. Left chest port. No mediastinal or hilar adenopathy. No pericardial effusion. No mediastinal mass. The esophagus is decompressed. Lungs/Pleura: No consolidation, pulmonary edema, or focal airspace opacity. No pulmonary mass or suspicious nodule. Trace pleural fluid or thickening posteriorly. Upper Abdomen: No acute abnormality. Musculoskeletal: There are no acute osseous abnormalities. Diffusely increased bone density consistent with renal osteodystrophy. Exaggerated thoracic kyphosis and scoliosis. Review of the MIP images confirms the above findings. IMPRESSION: 1. No pulmonary embolus. 2. No aortic dissection. There is atherosclerosis of the thoracic aorta with aneurysmal dilatation of the ascending aorta, maximal dimension 4.3 cm. This is unchanged from prior CT,  however recommend follow-up. Recommend annual imaging followup by CTA or MRA. This recommendation follows 2010 ACCF/AHA/AATS/ACR/ASA/SCA/SCAI/SIR/STS/SVM Guidelines for the Diagnosis and Management of Patients with Thoracic Aortic Disease. Circulation. 2010; 121: [id number] 3. Coronary artery calcifications. Electronically Signed   By: Jeb Levering M.D.   On: 08/31/2015 02:05   Nm Myocar Multi W/spect W/wall Motion / Ef  Result Date: 09/01/2015  Blood pressure demonstrated a normal response to exercise.  There was no ST segment deviation noted during stress.  The study is normal.  This is a low risk study.  The left ventricular ejection fraction is normal (55-65%).      ASSESSMENT AND PLAN:   60 year old female with past medical history of breast cancer, history of CHF, chronic kidney disease, previous history of DVT, gout, hypertension who presented to the hospital complaining of some chest pain.  1. Chest pain-atypical and likely related to musculoskeletal process. Pain was actually reproducible. -Given her risk factors she was observed on telemetry, her troponins did not turn upwards. CT chest (-) for PE or pneumonia.  -She underwent a nuclear medicine stress test which was negative for any acute pathology. -She is currently being discharged home with follow-up with her primary care physician as outpatient.  2. End-stage renal disease on hemodialysis-nephrology was consulted. - cont. Dialysis on MWF  3. Fever of Unknown origin - Pt. Had low grade fever of 100.4 yesterday. Now resolved.  - no source identified. Cont. Empiric Levaquin for now.   4. Anemia of chronic disease - due to ESRD - Hg. Down to 6.9 today. Will give one of PRBC's and repeat Hg. In a.m. Tomorrow.   5. HTN - cont. Losartan, Metoprolol.   6. GERD - cont. Protonix  7. Secondary Hyperparathyroidism -cont. Sensipar  8. Hx of previous DVT - cont. Coumadin   All the records are reviewed and case  discussed with Care Management/Social Workerr. Management plans discussed with the patient, family and they are in agreement.  CODE STATUS: FULL CODE  DVT Prophylaxis: Warfarin  TOTAL TIME TAKING CARE OF THIS PATIENT: 30 minutes.   POSSIBLE D/C IN 1-2 DAYS, DEPENDING ON CLINICAL CONDITION.  [name] M.D on 09/01/2015 at 4:14 PM  Between 7am to 6pm - Pager - [phone]  After 6pm go to [email or web address] - password [email or web address] [facility]  Office  [phone]  CC: Primary care physician; Murlean Iba, MD  Note: This dictation was prepared with Dragon dictation along with smaller phrase technology. Any transcriptional errors that result from this process are unintentional.

## 2015-09-01 NOTE — Progress Notes (Signed)
[facility]  ROUNDING NOTE   Subjective:   Stress test this morning.  She continues to have chest pain  Objective:  Vital signs in last 24 hours:  Temp:  [98 F (36.7 C)-98.6 F (37 C)] 98.6 F (37 C) (08/25 1201) Pulse Rate:  [74-100] 100 (08/25 1201) Resp:  [15-22] 16 (08/25 1201) BP: (105-141)/(60-77) 128/69 (08/25 1201) SpO2:  [92 %-99 %] 99 % (08/25 1201) Weight:  [67.6 kg (149 lb 1.6 oz)] 67.6 kg (149 lb 1.6 oz) (08/25 0500)  Weight change: -0.408 kg (-14.4 oz) Filed Weights   08/31/15 0008 08/31/15 0441 09/01/15 0500  Weight: 68 kg (150 lb) 68.3 kg (150 lb 9.6 oz) 67.6 kg (149 lb 1.6 oz)    Intake/Output: I/O last 3 completed shifts: In: 240 [P.O.:240] Out: 0    Intake/Output this shift:  No intake/output data recorded.  Physical Exam: General: No acute distress  Head: Normocephalic, atraumatic. Moist oral mucosal membranes  Eyes: Anicteric  Neck: Supple, trachea midline  Lungs:  Clear to auscultation, normal effort  Heart: S1S2 no rubs  Abdomen:  Soft, nontender, bowel sounds present  Extremities: No peripheral edema.  Neurologic: Nonfocal, moving all four extremities  Skin: No lesions  Access: R IJ permcath    Basic Metabolic Panel:  Recent Labs Lab 08/27/15 0007 08/27/15 0553 08/29/15 0056 08/31/15 0100  NA 138 137  --  141  K 4.5 5.2*  --  3.7  CL 105 105  --  103  CO2 22 21*  --  29  GLUCOSE 108* 108*  --  97  BUN 45* 52*  --  16  CREATININE 7.36* 7.75*  --  4.62*  CALCIUM 7.4* 8.0*  --  9.0  PHOS  --   --  3.6  --     Liver Function Tests:  Recent Labs Lab 08/27/15 0007  AST 20  ALT 6*  ALKPHOS 569*  BILITOT 0.3  PROT 6.4*  ALBUMIN 3.3*   No results for input(s): LIPASE, AMYLASE in the last 168 hours. No results for input(s): AMMONIA in the last 168 hours.  CBC:  Recent Labs Lab 08/27/15 0007 08/27/15 0553 08/31/15 0100  WBC 8.8 9.0 7.3  NEUTROABS 7.8*  --   --   HGB 9.6* 7.8* 7.9*  HCT 28.1* 23.2*  23.0*  MCV 86.7 86.0 86.1  PLT 134* 114* 148*    Cardiac Enzymes:  Recent Labs Lab 08/27/15 0007 08/31/15 0100 08/31/15 0437 08/31/15 1027 08/31/15 1619  TROPONINI 0.03* 0.03* 0.09* 0.03* 0.05*    BNP: Invalid input(s): POCBNP  CBG: No results for input(s): GLUCAP in the last 168 hours.  Microbiology: Results for orders placed or performed during the hospital encounter of 08/26/15  Blood Culture (routine x 2)     Status: None   Collection Time: 08/27/15 12:41 AM  Result Value Ref Range Status   Specimen Description BLOOD RIGHT ASSIST CONTROL  Final   Special Requests BOTTLES DRAWN AEROBIC AND ANAEROBIC [facility]  Final   Culture NO GROWTH 5 DAYS  Final   Report Status 09/01/2015 FINAL  Final  Blood Culture (routine x 2)     Status: None   Collection Time: 08/27/15 12:41 AM  Result Value Ref Range Status   Specimen Description BLOOD LEFT ASSIST CONTROL  Final   Special Requests   Final    BOTTLES DRAWN AEROBIC AND ANAEROBIC 12CCAERO,7CCANA   Culture NO GROWTH 5 DAYS  Final   Report Status 09/01/2015 FINAL  Final  Urine culture     Status: None   Collection Time: 08/27/15  2:00 AM  Result Value Ref Range Status   Specimen Description URINE, RANDOM  Final   Special Requests NONE  Final   Culture NO GROWTH Performed at [facility]   Final   Report Status 08/28/2015 FINAL  Final  MRSA PCR Screening     Status: None   Collection Time: 08/27/15  9:37 AM  Result Value Ref Range Status   MRSA by PCR NEGATIVE NEGATIVE Final    Comment:        The GeneXpert MRSA Assay (FDA approved for NASAL specimens only), is one component of a comprehensive MRSA colonization surveillance program. It is not intended to diagnose MRSA infection nor to guide or monitor treatment for MRSA infections.     Coagulation Studies:  Recent Labs  08/31/15 0101 09/01/15 0425  LABPROT 26.8* 26.3*  INR 2.42 2.37    Urinalysis: No results for input(s): COLORURINE, LABSPEC,  PHURINE, GLUCOSEU, HGBUR, BILIRUBINUR, KETONESUR, PROTEINUR, UROBILINOGEN, NITRITE, LEUKOCYTESUR in the last 72 hours.  Invalid input(s): APPERANCEUR    Imaging: Dg Chest 2 View  Result Date: 08/31/2015 CLINICAL DATA:  60 y/o F; chest pain and tachycardia since this morning. EXAM: CHEST  2 VIEW COMPARISON:  Chest radiograph 08/27/2015 FINDINGS: Stable cardiomediastinal silhouette given positioning and technique. Aortic arch calcifications. Left port catheter with tip projecting over lower SVC and right dialysis catheter unchanged in position. No consolidation, pneumothorax, or pleural effusion. No acute osseous abnormality is evident. Exaggerated thoracic kyphosis with moderate degenerative changes of the thoracic spine. IMPRESSION: No active cardiopulmonary disease. Electronically Signed   By: Kristine Garbe M.D.   On: 08/31/2015 00:54   Ct Angio Chest Pe W And/or Wo Contrast  Result Date: 08/31/2015 CLINICAL DATA:  Chest pain, dyspnea, tachycardia. History of DVT. Recent hospital admission for sepsis, discharged yesterday. EXAM: CT ANGIOGRAPHY CHEST WITH CONTRAST TECHNIQUE: Multidetector CT imaging of the chest was performed using the standard protocol during bolus administration of intravenous contrast. Multiplanar CT image reconstructions and MIPs were obtained to evaluate the vascular anatomy. CONTRAST:  75 cc Isovue 370 IV COMPARISON:  Radiographs earlier this day.  Chest CT 05/09/2013 FINDINGS: Cardiovascular: There are no filling defects within the pulmonary arteries to suggest pulmonary embolus. There is no aortic dissection. 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 Moderate atherosclerosis. Multi chamber cardiomegaly. Scattered coronary artery calcifications. There is a conventional branching pattern from the aortic arch. Mediastinum/Nodes: Right internal jugular dialysis catheter. Left chest port. No mediastinal or hilar  adenopathy. No pericardial effusion. No mediastinal mass. The esophagus is decompressed. Lungs/Pleura: No consolidation, pulmonary edema, or focal airspace opacity. No pulmonary mass or suspicious nodule. Trace pleural fluid or thickening posteriorly. Upper Abdomen: No acute abnormality. Musculoskeletal: There are no acute osseous abnormalities. Diffusely increased bone density consistent with renal osteodystrophy. Exaggerated thoracic kyphosis and scoliosis. Review of the MIP images confirms the above findings. IMPRESSION: 1. No pulmonary embolus. 2. No aortic dissection. There is atherosclerosis of the thoracic aorta with aneurysmal dilatation of the ascending aorta, maximal dimension 4.3 cm. This is unchanged from prior CT, however recommend follow-up. Recommend annual imaging followup by CTA or MRA. This recommendation follows 2010 ACCF/AHA/AATS/ACR/ASA/SCA/SCAI/SIR/STS/SVM Guidelines for the Diagnosis and Management of Patients with Thoracic Aortic Disease. Circulation. 2010; 121: [id number] 3. Coronary artery calcifications. Electronically Signed   By: Jeb Levering M.D.   On: 08/31/2015 02:05  Medications:     . aspirin EC  81 mg Oral Daily  . cinacalcet  120 mg Oral Daily  . clonazePAM  0.5-1 mg Oral Daily  . docusate sodium  100 mg Oral BID  . levofloxacin  250 mg Oral Q48H  . losartan  50 mg Oral Once per day on Sun Tue Thu Sat  . metoprolol tartrate  50 mg Oral 2 times per day on Sun Tue Thu Sat  . pantoprazole  40 mg Oral BID AC  . sertraline  100 mg Oral QHS  . sevelamer carbonate  2,400 mg Oral TID WC  . sodium chloride flush  3 mL Intravenous Q12H  . warfarin  5 mg Oral Once per day on Sun Fri Sat  . warfarin  7.5 mg Oral Once per day on Mon Tue Wed Thu   acetaminophen **OR** acetaminophen, HYDROcodone-acetaminophen, ondansetron, prochlorperazine, traMADol, zolpidem  Assessment/ Plan:  60 y.o. black female ESRD on HD MWF, hypertension, breast cancer, gout, history of DVT,  psoriasis who presented to [facility] with chest pain.   [facility] nephrology/MWF/N. [place].  1. ESRD on HD MWF: Treatment for later today. Orders prepared.   2. Anemia of chronic kidney disease.  - Avoid Epogen given recent diagnosis of breast cancer.  3. Secondary hyperparathyroidism. -  Continue cinacalcet and Renvela   4. Hypertension:  - Continue losartan and metoprolol.   LOS: 0 Kinsley Holderman 8/25/201712:05 PM

## 2015-09-01 NOTE — Progress Notes (Signed)
HD STARTED  

## 2015-09-01 NOTE — Discharge Summary (Signed)
[name] [place] NAME: Teresa Cook    MR#:  [id number]  DATE OF BIRTH:  Apr 30, 1955  DATE OF ADMISSION:  08/31/2015 ADMITTING PHYSICIAN: Harrie Foreman, MD  DATE OF DISCHARGE: 09/01/2015  PRIMARY CARE PHYSICIAN: Murlean Iba, MD    ADMISSION DIAGNOSIS:  Tachycardia [R00.0] Chest pain, unspecified chest pain type [R07.9]  DISCHARGE DIAGNOSIS:  Active Problems:   Chest pain   SECONDARY DIAGNOSIS:   Past Medical History:  Diagnosis Date  . Anemia   . Anxiety   . Breast cancer ([place])   . CHF (congestive heart failure) ([place])   . Chronic kidney disease   . Depression   . Dialysis patient ([facility])   . DVT (deep venous thrombosis) ([facility])    left leg  . DVT (deep venous thrombosis) ([place])   . Gout   . HTN (hypertension)   . Hypertension   . Parathyroid abnormality ([place])   . Parathyroid disease ([place])   . Psoriasis   . Renal insufficiency   . Sickle cell anemia ([facility])    traits    HOSPITAL COURSE:   60 year old female with past medical history of breast cancer, history of CHF, chronic kidney disease, previous history of DVT, gout, hypertension who presented to the hospital complaining of some chest pain.  1. Chest pain-atypical and likely related to musculoskeletal process. Pain was actually reproducible. -Given her risk factors she was observed on telemetry, her troponins did not turn upwards. CT chest (-) for PE or pneumonia.  -She underwent a nuclear medicine stress test which was negative for any acute pathology. -She is currently being discharged home with follow-up with her primary care physician as outpatient.  2. End-stage renal disease on hemodialysis-nephrology was consulted. - pt. Will resume her HD upon discharge per her schedule.   3. Fever of Unknown origin - Pt. Had low grade fever of 100.4 yesterday. Now resolved.  - no source identified. Was given Levaquin empirically in the hospital and I am discharging her  on a short course of levaquin.   4. HTN - cont. Losartan, Metoprolol.   5. GERD - cont. Protonix  6. Secondary Hyperparathyroidism -cont. Sensipar  7. Hx of previous DVT - cont. Coumadin  DISCHARGE CONDITIONS:   Stable.   CONSULTS OBTAINED:  Treatment Team:  Teodoro Spray, MD Lavonia Dana, MD  DRUG ALLERGIES:   Allergies  Allergen Reactions  . Adhesive [Tape] Itching    Paper tape is ok to use.    DISCHARGE MEDICATIONS:     Medication List    TAKE these medications   clonazePAM 1 MG tablet Commonly known as:  KLONOPIN Take 0.5-1 mg by mouth daily.   HYDROcodone-acetaminophen 5-325 MG tablet Commonly known as:  NORCO/VICODIN Take 1-2 tablets by mouth every 4 (four) hours as needed for moderate pain or severe pain.   levofloxacin 250 MG tablet Commonly known as:  LEVAQUIN Take 1 tablet (250 mg total) by mouth every other day.   lidocaine-prilocaine cream Commonly known as:  EMLA Apply 1 application topically as needed.   losartan 50 MG tablet Commonly known as:  COZAAR Take 100 mg by mouth daily. Takes on non-dialysis days=Tuesday, Thursday, Saturday and Sunday.   metoprolol 100 MG tablet Commonly known as:  LOPRESSOR Take 50 mg by mouth 2 (two) times daily. Takes on non-dialysis days=Tuesday, Thursday, Saturday and Sunday.   ondansetron 8 MG tablet Commonly known as:  ZOFRAN Take 1 tablet (8 mg total) by mouth every  8 (eight) hours as needed for nausea or vomiting.   pantoprazole 40 MG tablet Commonly known as:  PROTONIX Take 1 tablet (40 mg total) by mouth 2 (two) times daily before a meal. What changed:  medication strength  how much to take  when to take this   prochlorperazine 10 MG tablet Commonly known as:  COMPAZINE Take 1 tablet (10 mg total) by mouth every 6 (six) hours as needed for nausea or vomiting.   SENSIPAR 60 MG tablet Generic drug:  cinacalcet Take 120 mg by mouth daily.   sertraline 100 MG tablet Commonly known as:   ZOLOFT Take 100 mg by mouth at bedtime.   sevelamer carbonate 800 MG tablet Commonly known as:  RENVELA Take 2,400 mg by mouth 3 (three) times daily with meals.   traMADol 50 MG tablet Commonly known as:  ULTRAM Take 50 mg by mouth every 6 (six) hours as needed.   warfarin 5 MG tablet Commonly known as:  COUMADIN Take 1 tablet Friday, Saturday and Sundays. Take 1 1/2 tablets the other days of the week   zolpidem 10 MG tablet Commonly known as:  AMBIEN Take 10 mg by mouth at bedtime as needed for sleep.         DISCHARGE INSTRUCTIONS:   DIET:  Cardiac diet and Renal diet  DISCHARGE CONDITION:  Stable  ACTIVITY:  Activity as tolerated  OXYGEN:  Home Oxygen: No.   Oxygen Delivery: room air  DISCHARGE LOCATION:  home   If you experience worsening of your admission symptoms, develop shortness of breath, life threatening emergency, suicidal or homicidal thoughts you must seek medical attention immediately by calling 911 or calling your MD immediately  if symptoms less severe.  You Must read complete instructions/literature along with all the possible adverse reactions/side effects for all the Medicines you take and that have been prescribed to you. Take any new Medicines after you have completely understood and accpet all the possible adverse reactions/side effects.   Please note  You were cared for by a hospitalist during your hospital stay. If you have any questions about your discharge medications or the care you received while you were in the hospital after you are discharged, you can call the unit and asked to speak with the hospitalist on call if the hospitalist that took care of you is not available. Once you are discharged, your primary care physician will handle any further medical issues. Please note that NO REFILLS for any discharge medications will be authorized once you are discharged, as it is imperative that you return to your primary care physician (or  establish a relationship with a primary care physician if you do not have one) for your aftercare needs so that they can reassess your need for medications and monitor your lab values.     Today   Still having some chest pain but reproducible, afebrile, hemodynamically stable. Tolerating hemodialysis well.  VITAL SIGNS:  Blood pressure (!) 150/86, pulse 98, temperature 98.6 F (37 C), temperature source Oral, resp. rate (!) 23, height 5\' 6" (1.676 m), weight 68.8 kg (151 lb 9.6 oz), SpO2 99 %.  I/O:   Intake/Output Summary (Last 24 hours) at 09/01/15 1602 Last data filed at 09/01/15 1406  Gross per 24 hour  Intake              480 ml  Output              20 0 ml  Net  280 ml    PHYSICAL EXAMINATION:  GENERAL:  60 y.o.-year-old patient lying in the bed in no acute distress.  EYES: Pupils equal, round, reactive to light and accommodation. No scleral icterus. Extraocular muscles intact.  HEENT: Head atraumatic, normocephalic. Oropharynx and nasopharynx clear.  NECK:  Supple, no jugular venous distention. No thyroid enlargement, no tenderness.  LUNGS: Normal breath sounds bilaterally, no wheezing, rales,rhonchi. No use of accessory muscles of respiration.  CARDIOVASCULAR: S1, S2 normal. No murmurs, rubs, or gallops.  ABDOMEN: Soft, non-tender, non-distended. Bowel sounds present. No organomegaly or mass.  EXTREMITIES: No pedal edema, cyanosis, or clubbing.  NEUROLOGIC: Cranial nerves II through XII are intact. No focal motor or sensory defecits b/l.  PSYCHIATRIC: The patient is alert and oriented x 3. Good affect.  SKIN: No obvious rash, lesion, or ulcer.   Right chest HD cath in place.    DATA REVIEW:   CBC  Recent Labs Lab 09/01/15 1408  WBC 16.3*  HGB 6.9*  HCT 20.2*  PLT 166    Chemistries   Recent Labs Lab 08/27/15 0007  09/01/15 1408  NA 138  < > 135  K 4.5  < > 4.2  CL 105  < > 99*  CO2 22  < > 26  GLUCOSE 108*  < > 108*  BUN 45*  < > 26*   CREATININE 7.36*  < > 7.82*  CALCIUM 7.4*  < > 7.4*  AST 20  --   --   ALT 6*  --   --   ALKPHOS 569*  --   --   BILITOT 0.3  --   --   < > = values in this interval not displayed.  Cardiac Enzymes  Recent Labs Lab 08/31/15 1619  TROPONINI 0.05*    Microbiology Results  Results for orders placed or performed during the hospital encounter of 08/26/15  Blood Culture (routine x 2)     Status: None   Collection Time: 08/27/15 12:41 AM  Result Value Ref Range Status   Specimen Description BLOOD RIGHT ASSIST CONTROL  Final   Special Requests BOTTLES DRAWN AEROBIC AND ANAEROBIC [place]  Final   Culture NO GROWTH 5 DAYS  Final   Report Status 09/01/2015 FINAL  Final  Blood Culture (routine x 2)     Status: None   Collection Time: 08/27/15 12:41 AM  Result Value Ref Range Status   Specimen Description BLOOD LEFT ASSIST CONTROL  Final   Special Requests   Final    BOTTLES DRAWN AEROBIC AND ANAEROBIC 12CCAERO,7CCANA   Culture NO GROWTH 5 DAYS  Final   Report Status 09/01/2015 FINAL  Final  Urine culture     Status: None   Collection Time: 08/27/15  2:00 AM  Result Value Ref Range Status   Specimen Description URINE, RANDOM  Final   Special Requests NONE  Final   Culture NO GROWTH Performed at [facility]   Final   Report Status 08/28/2015 FINAL  Final  MRSA PCR Screening     Status: None   Collection Time: 08/27/15  9:37 AM  Result Value Ref Range Status   MRSA by PCR NEGATIVE NEGATIVE Final    Comment:        The GeneXpert MRSA Assay (FDA approved for NASAL specimens only), is one component of a comprehensive MRSA colonization surveillance program. It is not intended to diagnose MRSA infection nor to guide or monitor treatment for MRSA infections.     RADIOLOGY:  Dg  Chest 2 View  Result Date: 08/31/2015 CLINICAL DATA:  60 y/o F; chest pain and tachycardia since this morning. EXAM: CHEST  2 VIEW COMPARISON:  Chest radiograph 08/27/2015 FINDINGS: Stable  cardiomediastinal silhouette given positioning and technique. Aortic arch calcifications. Left port catheter with tip projecting over lower SVC and right dialysis catheter unchanged in position. No consolidation, pneumothorax, or pleural effusion. No acute osseous abnormality is evident. Exaggerated thoracic kyphosis with moderate degenerative changes of the thoracic spine. IMPRESSION: No active cardiopulmonary disease. Electronically Signed   By: Kristine Garbe M.D.   On: 08/31/2015 00:54   Ct Angio Chest Pe W And/or Wo Contrast  Result Date: 08/31/2015 CLINICAL DATA:  Chest pain, dyspnea, tachycardia. History of DVT. Recent hospital admission for sepsis, discharged yesterday. EXAM: CT ANGIOGRAPHY CHEST WITH CONTRAST TECHNIQUE: Multidetector CT imaging of the chest was performed using the standard protocol during bolus administration of intravenous contrast. Multiplanar CT image reconstructions and MIPs were obtained to evaluate the vascular anatomy. CONTRAST:  75 cc Isovue 370 IV COMPARISON:  Radiographs earlier this day.  Chest CT 05/09/2013 FINDINGS: Cardiovascular: There are no filling defects within the pulmonary arteries to suggest pulmonary embolus. There is no aortic dissection. 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 Moderate atherosclerosis. Multi chamber cardiomegaly. Scattered coronary artery calcifications. There is a conventional branching pattern from the aortic arch. Mediastinum/Nodes: Right internal jugular dialysis catheter. Left chest port. No mediastinal or hilar adenopathy. No pericardial effusion. No mediastinal mass. The esophagus is decompressed. Lungs/Pleura: No consolidation, pulmonary edema, or focal airspace opacity. No pulmonary mass or suspicious nodule. Trace pleural fluid or thickening posteriorly. Upper Abdomen: No acute abnormality. Musculoskeletal: There are no acute osseous abnormalities. Diffusely increased  bone density consistent with renal osteodystrophy. Exaggerated thoracic kyphosis and scoliosis. Review of the MIP images confirms the above findings. IMPRESSION: 1. No pulmonary embolus. 2. No aortic dissection. There is atherosclerosis of the thoracic aorta with aneurysmal dilatation of the ascending aorta, maximal dimension 4.3 cm. This is unchanged from prior CT, however recommend follow-up. Recommend annual imaging followup by CTA or MRA. This recommendation follows 2010 ACCF/AHA/AATS/ACR/ASA/SCA/SCAI/SIR/STS/SVM Guidelines for the Diagnosis and Management of Patients with Thoracic Aortic Disease. Circulation. 2010; 121: [id number] 3. Coronary artery calcifications. Electronically Signed   By: Jeb Levering M.D.   On: 08/31/2015 02:05   Nm Myocar Multi W/spect W/wall Motion / Ef  Result Date: 09/01/2015  Blood pressure demonstrated a normal response to exercise.  There was no ST segment deviation noted during stress.  The study is normal.  This is a low risk study.  The left ventricular ejection fraction is normal (55-65%).       Management plans discussed with the patient, family and they are in agreement.  CODE STATUS:     Code Status Orders        Start     Ordered   08/31/15 0427  Full code  Continuous     08/31/15 0426    Code Status History    Date Active Date Inactive Code Status Order ID Comments User Context   08/27/2015  4:06 AM 08/29/2015  3:57 PM Full Code [id number]  Saundra Shelling, MD ED   07/09/2015  2:44 PM 07/12/2015  7:55 PM Full Code [id number]  Laverle Hobby, MD ED   09/19/2014 11:47 AM 09/20/2014  6:25 PM Full Code [id number]  Dustin Flock, MD ED   08/29/2014  2:28 PM 08/30/2014  5:43 PM Full Code [id number]  Fritzi Mandes, MD Inpatient    Advance Directive Documentation   Flowsheet Row Most Recent Value  Type of Advance Directive  Healthcare Power of Attorney  Pre-existing out of facility DNR order (yellow form or pink MOST form)  No data  "MOST" Form in  Place?  No data      TOTAL TIME TAKING CARE OF THIS PATIENT: 40 minutes.    [name] M.D on 09/01/2015 at 4:02 PM  Between 7am to 6pm - Pager - [phone]  After 6pm go to [email or web address] - Proofreader  [facility]  Office  [phone]  CC: Primary care physician; Murlean Iba, MD

## 2015-09-01 NOTE — Progress Notes (Signed)
Hd completed 

## 2015-09-01 NOTE — Progress Notes (Signed)
Patient is unsure if she wants to have the stress test.  Has questions regarding her lab results.  Explained to her about what the troponin results were and when elevated it's indicated of heart damage although not conclusive.  She agreed to the stress test and signed the consent.

## 2015-09-01 NOTE — Progress Notes (Signed)
PRE DIALYSIS ASSESSMENT 

## 2015-09-01 NOTE — Progress Notes (Signed)
ANTICOAGULATION CONSULT NOTE - Initial Consult  Pharmacy Consult for warfarin dosing Indication: VTE treatment  Allergies  Allergen Reactions  . Adhesive [Tape] Itching    Paper tape is ok to use.    Patient Measurements: Height: 5\' 6"  (167.6 cm) Weight: 149 lb 1.6 oz (67.6 kg) IBW/kg (Calculated) : 59.3 Heparin Dosing Weight: n/a  Vital Signs: Temp: 98.5 F (36.9 C) (08/25 0745) Temp Source: Oral (08/25 0745) BP: 141/77 (08/25 0745) Pulse Rate: 74 (08/25 0745)  Labs:  Recent Labs  08/31/15 0100 08/31/15 0101 08/31/15 0437 08/31/15 1027 08/31/15 1619 09/01/15 0425  HGB 7.9*  --   --   --   --   --   HCT 23.0*  --   --   --   --   --   PLT 148*  --   --   --   --   --   LABPROT  --  26.8*  --   --   --  26.3*  INR  --  2.42  --   --   --  2.37  CREATININE 4.62*  --   --   --   --   --   TROPONINI 0.03*  --  0.09* 0.03* 0.05*  --     Estimated Creatinine Clearance: 12 mL/min (by C-G formula based on SCr of 4.62 mg/dL).   Medical History: Past Medical History:  Diagnosis Date  . Anemia   . Anxiety   . Breast cancer ([place])   . CHF (congestive heart failure) ([place])   . Chronic kidney disease   . Depression   . Dialysis patient ([place])   . DVT (deep venous thrombosis) ([facility])    left leg  . DVT (deep venous thrombosis) ([place])   . Gout   . HTN (hypertension)   . Hypertension   . Parathyroid abnormality ([place])   . Parathyroid disease ([place])   . Psoriasis   . Renal insufficiency   . Sickle cell anemia ([facility])    traits    Medications:    Assessment: INR: 2.37  Goal of Therapy:  INR 2-3    Plan:  Continue Home dose of 7.5 mg M, T, W, Th and 5 mg F, S, . ordered. INR daily while on antibiotics.  Shontay Wallner D 09/01/2015,9:24 AM

## 2015-09-02 LAB — CBC
HCT: 24.8 % — ABNORMAL LOW (ref 35.0–47.0)
Hemoglobin: 8.4 g/dL — ABNORMAL LOW (ref 12.0–16.0)
MCH: 29.1 pg (ref 26.0–34.0)
MCHC: 33.7 g/dL (ref 32.0–36.0)
MCV: 86.2 fL (ref 80.0–100.0)
Platelets: 156 10*3/uL (ref 150–440)
RBC: 2.88 MIL/uL — ABNORMAL LOW (ref 3.80–5.20)
RDW: 14.7 % — ABNORMAL HIGH (ref 11.5–14.5)
WBC: 21.6 10*3/uL — ABNORMAL HIGH (ref 3.6–11.0)

## 2015-09-02 LAB — TYPE AND SCREEN
ABO/RH(D): O POS
Antibody Screen: NEGATIVE
Unit division: 0

## 2015-09-02 LAB — PROTIME-INR
INR: 1.96
Prothrombin Time: 22.6 seconds — ABNORMAL HIGH (ref 11.4–15.2)

## 2015-09-02 MED ORDER — WARFARIN SODIUM 1 MG PO TABS: 1.0000 mg | ORAL_TABLET | Freq: Once | ORAL | Status: DC

## 2015-09-02 MED ORDER — WARFARIN - PHARMACIST DOSING INPATIENT: Freq: Every day | Status: DC

## 2015-09-02 NOTE — Progress Notes (Addendum)
ANTICOAGULATION CONSULT NOTE - follow up Teresa Cook for warfarin dosing Indication: VTE treatment  Allergies  Allergen Reactions  . Adhesive [Tape] Itching    Paper tape is ok to use.    Patient Measurements: Height: 5\' 6"  (167.6 cm) Weight: 148 lb (67.1 kg) IBW/kg (Calculated) : 59.3 Heparin Dosing Weight: n/a  Vital Signs: Temp: 97.5 F (36.4 C) (08/26 0437) Temp Source: Oral (08/26 0437) BP: 148/82 (08/26 0437) Pulse Rate: 87 (08/26 0437)  Labs:  Recent Labs  08/31/15 0100 08/31/15 0101 08/31/15 0437 08/31/15 1027 08/31/15 1619 09/01/15 0425 09/01/15 1408 09/02/15 0437  HGB 7.9*  --   --   --   --   --  6.9* 8.4*  HCT 23.0*  --   --   --   --   --  20.2* 24.8*  PLT 148*  --   --   --   --   --  166 156  LABPROT  --  26.8*  --   --   --  26.3*  --  22.6*  INR  --  2.42  --   --   --  2.37  --  1.96  CREATININE 4.62*  --   --   --   --   --  7.82*  --   TROPONINI 0.03*  --  0.09* 0.03* 0.05*  --   --   --     Estimated Creatinine Clearance: 7.1 mL/min (by C-G formula based on SCr of 7.82 mg/dL).   Medical History: Past Medical History:  Diagnosis Date  . Anemia   . Anxiety   . Breast cancer ([place])   . CHF (congestive heart failure) ([place])   . Chronic kidney disease   . Depression   . Dialysis patient ([place])   . DVT (deep venous thrombosis) ([facility])    left leg  . DVT (deep venous thrombosis) ([place])   . Gout   . HTN (hypertension)   . Hypertension   . Parathyroid abnormality ([place])   . Parathyroid disease ([place])   . Psoriasis   . Renal insufficiency   . Sickle cell anemia ([facility])    traits    Assessment:  8/25 INR: 2.37  Warfarin 5 mg 8/26 INR 1.96   Goal of Therapy:  INR 2-3    Plan:  Patient on Home dose of 7.5 mg MTWTh and 5 mg FriSatSun . ordered. INR daily while on antibiotics(levaquin).  8/26  Will add Warfarin 1 mg to current dose of 5 mg for a total of 6 mg today.   Lachlyn Vanderstelt A 09/02/2015,9:40 AM

## 2015-09-02 NOTE — Progress Notes (Signed)
   09/01/15 2100  Clinical Encounter Type  Visited With Patient  Visit Type Follow-up  Consult/Referral To Chaplain  Spiritual Encounters  Spiritual Needs Prayer;Emotional  Stress Factors  Patient Stress Factors Health changes  Chaplain provided pastoral care to patient.   Chaplain [employer]

## 2015-09-13 NOTE — Progress Notes (Signed)
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  INTERVAL HISTORY: Patient returns to clinic today for further evaluation and consideration of cycle 2 of 4 of neoadjuvant Taxotere and Cytoxan.  She was recently admitted to the hospital with worsening chest pain, but no etiology could be determined. Patient also had significant leg pain and chills after receiving her Neulasta during cycle 1. She currently feels well and is back to her baseline. She continues to receive dialysis on Mondays, Wednesdays, and Fridays. She has no neurologic complaints. She denies any recent fevers. She has a good appetite and denies weight loss. She has no chest pain or shortness of breath. She denies any nausea, vomiting, constipation, or diarrhea. Patient offers no further specific complaints.  REVIEW OF SYSTEMS:   Review of Systems  Constitutional: Negative.  Negative for fever, malaise/fatigue and weight loss.  Respiratory: Negative.  Negative for cough and shortness of breath.   Cardiovascular: Negative.  Negative for chest pain.  Gastrointestinal: Negative.  Negative for abdominal pain.  Genitourinary: Negative.   Musculoskeletal: Negative.   Neurological: Negative.  Negative for weakness.  Psychiatric/Behavioral: The patient is nervous/anxious.     As per HPI. Otherwise, a complete review of systems is negative.  PAST MEDICAL HISTORY: Past Medical History:  Diagnosis Date  . Anemia   . Anxiety   . Breast cancer ([place])   . CHF (congestive heart failure) ([place])   . Chronic kidney disease   . Depression   . Dialysis patient ([place])   . DVT (deep venous thrombosis) ([facility])     left leg  . DVT (deep venous thrombosis) ([place])   . Gout   . HTN (hypertension)   . Hypertension   . Parathyroid abnormality ([place])   . Parathyroid disease ([place])   . Psoriasis   . Renal insufficiency   . Sickle cell anemia ([facility])    traits    PAST SURGICAL HISTORY: Past Surgical History:  Procedure Laterality Date  . ABDOMINAL HYSTERECTOMY    . APPENDECTOMY    . BREAST BIOPSY Left 10/28/2013   benign  . BREAST EXCISIONAL BIOPSY Left 2002   benign  . INSERTION OF DIALYSIS CATHETER  2014  . PARTIAL HYSTERECTOMY    . PORTACATH PLACEMENT Left 08/21/2015   Procedure: INSERTION PORT-A-CATH;  Surgeon: Hubbard Robinson, MD;  Location: [facility] ORS;  Service: General;  Laterality: Left;    FAMILY HISTORY: Family History  Problem Relation Age of Onset  . Stroke Mother   . CVA Mother   . Hypertension Mother   . Hypertension Father   . Hypertension Sister   . Diabetes Sister   . Cancer Sister 53    Breast  . Stroke Brother   . Hypertension Brother   . Breast cancer Maternal Aunt 26       ADVANCED DIRECTIVES:    HEALTH MAINTENANCE: Social History  Substance Use Topics  . Smoking status: Never Smoker  . Smokeless tobacco: Never Used  . Alcohol use No     Colonoscopy:  PAP:  Bone density:  Lipid panel:  Allergies  Allergen Reactions  . Adhesive [Tape] Itching    Paper tape is ok to use.    Current Outpatient  Prescriptions  Medication Sig Dispense Refill  . clonazePAM (KLONOPIN) 1 MG tablet Take 0.5-1 mg by mouth daily.     Marland Kitchen HYDROcodone-acetaminophen (NORCO/VICODIN) 5-325 MG tablet Take 1-2 tablets by mouth every 4 (four) hours as needed for moderate pain or severe pain. 20 tablet 0  . levofloxacin (LEVAQUIN) 250 MG tablet Take 1 tablet (250 mg total) by mouth every other day. 3 tablet 0  . lidocaine-prilocaine (EMLA) cream Apply 1 application topically as needed. 30 g 2  . losartan (COZAAR) 50 MG tablet Take 100 mg by mouth daily. Takes on non-dialysis  days=Tuesday, Thursday, Saturday and Sunday.    . metoprolol (LOPRESSOR) 100 MG tablet Take 50 mg by mouth 2 (two) times daily. Takes on non-dialysis days=Tuesday, Thursday, Saturday and Sunday.    . ondansetron (ZOFRAN) 8 MG tablet Take 1 tablet (8 mg total) by mouth every 8 (eight) hours as needed for nausea or vomiting. 30 tablet 2  . pantoprazole (PROTONIX) 40 MG tablet Take 1 tablet (40 mg total) by mouth 2 (two) times daily before a meal. 60 tablet 0  . prochlorperazine (COMPAZINE) 10 MG tablet Take 1 tablet (10 mg total) by mouth every 6 (six) hours as needed for nausea or vomiting. 30 tablet 2  . SENSIPAR 60 MG tablet Take 120 mg by mouth daily.     . sertraline (ZOLOFT) 100 MG tablet Take 100 mg by mouth at bedtime.     . sevelamer carbonate (RENVELA) 800 MG tablet Take 2,400 mg by mouth 3 (three) times daily with meals.     . traMADol (ULTRAM) 50 MG tablet Take 50 mg by mouth every 6 (six) hours as needed.     . warfarin (COUMADIN) 5 MG tablet Take 1 tablet Friday, Saturday and Sundays. Take 1 1/2 tablets the other days of the week    . zolpidem (AMBIEN) 10 MG tablet Take 10 mg by mouth at bedtime as needed for sleep.     No current facility-administered medications for this visit.    Facility-Administered Medications Ordered in Other Visits  Medication Dose Route Frequency Provider Last Rate Last Dose  . sodium chloride flush (NS) 0.9 % injection 10 mL  10 mL Intracatheter PRN [name], MD   10 mL at 09/14/15 0952    OBJECTIVE: Vitals:   09/14/15 1009  BP: (!) 178/110  Pulse: 88  Resp: 18  Temp: 98.1 F (36.7 C)     Body mass index is 24.45 kg/m.    ECOG FS:0 - Asymptomatic  General: Well-developed, well-nourished, no acute distress. Eyes: Pink conjunctiva, anicteric sclera. Breasts: Patient refused exam today. Lungs: Clear to auscultation bilaterally. Chest wall: Dialysis catheter in right chest wall, port noted in left chest wall. Heart: Regular rate and  rhythm. No rubs, murmurs, or gallops. Abdomen: Soft, nontender, nondistended. No organomegaly noted, normoactive bowel sounds. Musculoskeletal: No edema, cyanosis, or clubbing. Neuro: Alert, answering all questions appropriately. Cranial nerves grossly intact. Skin: No rashes or petechiae noted. Psych: Normal affect.   LAB RESULTS:  Lab Results  Component Value Date   NA 135 09/14/2015   K 4.3 09/14/2015   CL 105 09/14/2015   CO2 22 09/14/2015   GLUCOSE 89 09/14/2015   BUN 20 09/14/2015   CREATININE 5.55 (H) 09/14/2015   CALCIUM 8.1 (L) 09/14/2015   PROT 6.6 09/14/2015   ALBUMIN 3.1 (L) 09/14/2015   AST 19 09/14/2015   ALT 14 09/14/2015   ALKPHOS 481 (H) 09/14/2015   BILITOT 0.4 09/14/2015  GFRNONAA 7 (L) 09/14/2015   GFRAA 9 (L) 09/14/2015    Lab Results  Component Value Date   WBC 7.9 09/14/2015   NEUTROABS 5.8 09/14/2015   HGB 8.6 (L) 09/14/2015   HCT 25.2 (L) 09/14/2015   MCV 85.5 09/14/2015   PLT 245 09/14/2015     STUDIES: Dg Chest 2 View  Result Date: 08/31/2015 CLINICAL DATA:  60 y/o F; chest pain and tachycardia since this morning. EXAM: CHEST  2 VIEW COMPARISON:  Chest radiograph 08/27/2015 FINDINGS: Stable cardiomediastinal silhouette given positioning and technique. Aortic arch calcifications. Left port catheter with tip projecting over lower SVC and right dialysis catheter unchanged in position. No consolidation, pneumothorax, or pleural effusion. No acute osseous abnormality is evident. Exaggerated thoracic kyphosis with moderate degenerative changes of the thoracic spine. IMPRESSION: No active cardiopulmonary disease. Electronically Signed   By: Kristine Garbe M.D.   On: 08/31/2015 00:54   Ct Angio Chest Pe W And/or Wo Contrast  Result Date: 08/31/2015 CLINICAL DATA:  Chest pain, dyspnea, tachycardia. History of DVT. Recent hospital admission for sepsis, discharged yesterday. EXAM: CT ANGIOGRAPHY CHEST WITH CONTRAST TECHNIQUE: Multidetector CT  imaging of the chest was performed using the standard protocol during bolus administration of intravenous contrast. Multiplanar CT image reconstructions and MIPs were obtained to evaluate the vascular anatomy. CONTRAST:  75 cc Isovue 370 IV COMPARISON:  Radiographs earlier this day.  Chest CT 05/09/2013 FINDINGS: Cardiovascular: There are no filling defects within the pulmonary arteries to suggest pulmonary embolus. There is no aortic dissection. 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 Moderate atherosclerosis. Multi chamber cardiomegaly. Scattered coronary artery calcifications. There is a conventional branching pattern from the aortic arch. Mediastinum/Nodes: Right internal jugular dialysis catheter. Left chest port. No mediastinal or hilar adenopathy. No pericardial effusion. No mediastinal mass. The esophagus is decompressed. Lungs/Pleura: No consolidation, pulmonary edema, or focal airspace opacity. No pulmonary mass or suspicious nodule. Trace pleural fluid or thickening posteriorly. Upper Abdomen: No acute abnormality. Musculoskeletal: There are no acute osseous abnormalities. Diffusely increased bone density consistent with renal osteodystrophy. Exaggerated thoracic kyphosis and scoliosis. Review of the MIP images confirms the above findings. IMPRESSION: 1. No pulmonary embolus. 2. No aortic dissection. There is atherosclerosis of the thoracic aorta with aneurysmal dilatation of the ascending aorta, maximal dimension 4.3 cm. This is unchanged from prior CT, however recommend follow-up. Recommend annual imaging followup by CTA or MRA. This recommendation follows 2010 ACCF/AHA/AATS/ACR/ASA/SCA/SCAI/SIR/STS/SVM Guidelines for the Diagnosis and Management of Patients with Thoracic Aortic Disease. Circulation. 2010; 121: F163-W466 3. Coronary artery calcifications. Electronically Signed   By: Jeb Levering M.D.   On: 08/31/2015 02:05   Nm Myocar Multi  W/spect W/wall Motion / Ef  Result Date: 09/01/2015  Blood pressure demonstrated a normal response to exercise.  There was no ST segment deviation noted during stress.  The study is normal.  This is a low risk study.  The left ventricular ejection fraction is normal (55-65%).    Dg Chest Port 1 View  Result Date: 08/27/2015 CLINICAL DATA:  Fever and chills EXAM: PORTABLE CHEST 1 VIEW COMPARISON:  Six days ago FINDINGS: Chronic mild cardiomegaly. Stable mediastinal contours. Bilateral central line remain in stable position with tips at the SVC level. There is no edema, consolidation, effusion, or pneumothorax. IMPRESSION: Negative for pneumonia. Electronically Signed   By: Monte Fantasia M.D.   On: 08/27/2015 00:50   Dg Chest Port 1 View  Result Date: 08/21/2015  CLINICAL DATA:  Status post port placement EXAM: PORTABLE CHEST 1 VIEW COMPARISON:  08/08/2015 FINDINGS: Right dialysis catheter is again identified and stable. A new left chest wall port is noted with the catheter tip in the superior aspect of the right atrium. No pneumothorax is noted. The lungs are clear. No bony abnormality is noted. IMPRESSION: Status post port placement without pneumothorax. Electronically Signed   By: Inez Catalina M.D.   On: 08/21/2015 09:32   Dg C-arm 1-60 Min-no Report  Result Date: 08/21/2015 CLINICAL DATA: surgery C-ARM 1-60 MINUTES Fluoroscopy was utilized by the requesting physician.  No radiographic interpretation.    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  PLAN:    1. 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 Proceed with cycle 2 of 4 of Taxotere and Cytoxan today. Cytoxan will be dose reduce 50% secondary to ongoing dialysis. Will discontinue Neulasta. Once patient  completes her treatment, will refer back to surgery. Patient may or may not require XRT adjuvantly depending on her final pathology results and whether or not she pursues lumpectomy or mastectomy. Finally, at the conclusion all of patient's treatments she will benefit from an aromatase inhibitor for total 5 years. Return to clinic in 1 week for laboratory work and  then in 3 weeks for consideration of cycle 2. 2. End-stage renal disease: Continue dialysis on Monday, Wednesday, and Fridays. 3. Anemia: Likely multifactorial. Monitor. 4. Bone pain/chills: Possibly secondary to Neulasta. Discontinue as above. 5. Hypertension: Patient states she did not take her medication this morning.  Approximately 30 minutes was spent in discussion of which greater than 50% was consultation.   Patient expressed understanding and was in agreement with this plan. She also understands that She can call clinic at any time with any questions, concerns, or complaints.   Breast cancer of upper-inner quadrant of left female breast [facility])   Staging form: Breast, AJCC 7th Edition   - Clinical stage from 08/05/2015: Stage IIB (T2, N1, M0) - Signed by [name], MD on 08/05/2015  [name], MD   09/17/2015 9:02 AM

## 2015-09-14 ENCOUNTER — Inpatient Hospital Stay: Payer: Medicare Other

## 2015-09-14 ENCOUNTER — Inpatient Hospital Stay (HOSPITAL_BASED_OUTPATIENT_CLINIC_OR_DEPARTMENT_OTHER): Payer: Medicare Other | Admitting: Oncology

## 2015-09-14 ENCOUNTER — Inpatient Hospital Stay: Payer: Medicare Other | Attending: Oncology

## 2015-09-14 VITALS — BP 178/110 | HR 88 | Temp 98.1°F | Resp 18 | Wt 151.5 lb

## 2015-09-14 DIAGNOSIS — F419 Anxiety disorder, unspecified: Secondary | ICD-10-CM | POA: Diagnosis not present

## 2015-09-14 DIAGNOSIS — Z79899 Other long term (current) drug therapy: Secondary | ICD-10-CM | POA: Insufficient documentation

## 2015-09-14 DIAGNOSIS — Z170 Estrogen receptor positive status [ER+]: Secondary | ICD-10-CM | POA: Diagnosis not present

## 2015-09-14 DIAGNOSIS — C50212 Malignant neoplasm of upper-inner quadrant of left female breast: Secondary | ICD-10-CM | POA: Diagnosis not present

## 2015-09-14 DIAGNOSIS — I132 Hypertensive heart and chronic kidney disease with heart failure and with stage 5 chronic kidney disease, or end stage renal disease: Secondary | ICD-10-CM | POA: Diagnosis not present

## 2015-09-14 DIAGNOSIS — D631 Anemia in chronic kidney disease: Secondary | ICD-10-CM | POA: Diagnosis not present

## 2015-09-14 DIAGNOSIS — N186 End stage renal disease: Secondary | ICD-10-CM | POA: Diagnosis not present

## 2015-09-14 DIAGNOSIS — Z86718 Personal history of other venous thrombosis and embolism: Secondary | ICD-10-CM | POA: Insufficient documentation

## 2015-09-14 DIAGNOSIS — I509 Heart failure, unspecified: Secondary | ICD-10-CM | POA: Diagnosis not present

## 2015-09-14 DIAGNOSIS — Z5111 Encounter for antineoplastic chemotherapy: Secondary | ICD-10-CM | POA: Insufficient documentation

## 2015-09-14 DIAGNOSIS — N189 Chronic kidney disease, unspecified: Secondary | ICD-10-CM

## 2015-09-14 DIAGNOSIS — Z992 Dependence on renal dialysis: Secondary | ICD-10-CM | POA: Diagnosis not present

## 2015-09-14 DIAGNOSIS — D649 Anemia, unspecified: Secondary | ICD-10-CM

## 2015-09-14 DIAGNOSIS — I10 Essential (primary) hypertension: Secondary | ICD-10-CM

## 2015-09-14 LAB — COMPREHENSIVE METABOLIC PANEL
ALT: 14 U/L (ref 14–54)
ANION GAP: 8 (ref 5–15)
AST: 19 U/L (ref 15–41)
Albumin: 3.1 g/dL — ABNORMAL LOW (ref 3.5–5.0)
Alkaline Phosphatase: 481 U/L — ABNORMAL HIGH (ref 38–126)
BILIRUBIN TOTAL: 0.4 mg/dL (ref 0.3–1.2)
BUN: 20 mg/dL (ref 6–20)
CO2: 22 mmol/L (ref 22–32)
Calcium: 8.1 mg/dL — ABNORMAL LOW (ref 8.9–10.3)
Chloride: 105 mmol/L (ref 101–111)
Creatinine, Ser: 5.55 mg/dL — ABNORMAL HIGH (ref 0.44–1.00)
GFR calc Af Amer: 9 mL/min — ABNORMAL LOW (ref >60)
GFR, EST NON AFRICAN AMERICAN: 7 mL/min — AB (ref >60)
Glucose, Bld: 89 mg/dL (ref 65–99)
POTASSIUM: 4.3 mmol/L (ref 3.5–5.1)
Sodium: 135 mmol/L (ref 135–145)
TOTAL PROTEIN: 6.6 g/dL (ref 6.5–8.1)

## 2015-09-14 LAB — CBC WITH DIFFERENTIAL/PLATELET
Basophils Absolute: 0.1 10*3/uL (ref 0–0.1)
Basophils Relative: 1 %
Eosinophils Absolute: 0 10*3/uL (ref 0–0.7)
Eosinophils Relative: 0 %
HEMATOCRIT: 25.2 % — AB (ref 35.0–47.0)
Hemoglobin: 8.6 g/dL — ABNORMAL LOW (ref 12.0–16.0)
LYMPHS ABS: 1.4 10*3/uL (ref 1.0–3.6)
LYMPHS PCT: 18 %
MCH: 29.2 pg (ref 26.0–34.0)
MCHC: 34.1 g/dL (ref 32.0–36.0)
MCV: 85.5 fL (ref 80.0–100.0)
MONO ABS: 0.6 10*3/uL (ref 0.2–0.9)
MONOS PCT: 8 %
NEUTROS ABS: 5.8 10*3/uL (ref 1.4–6.5)
Neutrophils Relative %: 73 %
Platelets: 245 10*3/uL (ref 150–440)
RBC: 2.94 MIL/uL — ABNORMAL LOW (ref 3.80–5.20)
RDW: 15.1 % — AB (ref 11.5–14.5)
WBC: 7.9 10*3/uL (ref 3.6–11.0)

## 2015-09-14 MED ORDER — SODIUM CHLORIDE 0.9 % IV SOLN
Freq: Once | INTRAVENOUS | Status: AC
  Administered 2015-09-14: 11:00:00 via INTRAVENOUS
  Filled 2015-09-14: qty 1000

## 2015-09-14 MED ORDER — PALONOSETRON HCL INJECTION 0.25 MG/5ML
0.2500 mg | Freq: Once | INTRAVENOUS | Status: AC
  Administered 2015-09-14: 0.25 mg via INTRAVENOUS
  Filled 2015-09-14: qty 5

## 2015-09-14 MED ORDER — HEPARIN SOD (PORK) LOCK FLUSH 100 UNIT/ML IV SOLN
INTRAVENOUS | Status: AC
  Filled 2015-09-14: qty 5

## 2015-09-14 MED ORDER — HEPARIN SOD (PORK) LOCK FLUSH 100 UNIT/ML IV SOLN
500.0000 [IU] | Freq: Once | INTRAVENOUS | Status: AC | PRN
  Administered 2015-09-14: 500 [IU]

## 2015-09-14 MED ORDER — SODIUM CHLORIDE 0.9 % IV SOLN
10.0000 mg | Freq: Once | INTRAVENOUS | Status: AC
  Administered 2015-09-14: 10 mg via INTRAVENOUS
  Filled 2015-09-14: qty 1

## 2015-09-14 MED ORDER — DOCETAXEL CHEMO INJECTION 160 MG/16ML
75.0000 mg/m2 | Freq: Once | INTRAVENOUS | Status: AC
  Administered 2015-09-14: 130 mg via INTRAVENOUS
  Filled 2015-09-14: qty 13

## 2015-09-14 MED ORDER — SODIUM CHLORIDE 0.9% FLUSH
10.0000 mL | INTRAVENOUS | Status: DC | PRN
  Administered 2015-09-14: 10 mL
  Filled 2015-09-14: qty 10

## 2015-09-14 MED ORDER — SODIUM CHLORIDE 0.9 % IV SOLN
500.0000 mg | Freq: Once | INTRAVENOUS | Status: AC
  Administered 2015-09-14: 500 mg via INTRAVENOUS
  Filled 2015-09-14: qty 25

## 2015-09-14 NOTE — Progress Notes (Signed)
Pt appears withdrawn with flat affect this morning. States has lost all her hair which makes her upset. Has slight headache this morning and has not taken BP meds.

## 2015-09-21 ENCOUNTER — Other Ambulatory Visit: Payer: Self-pay

## 2015-09-21 ENCOUNTER — Inpatient Hospital Stay: Payer: Medicare Other

## 2015-09-21 ENCOUNTER — Telehealth: Payer: Self-pay | Admitting: *Deleted

## 2015-09-21 DIAGNOSIS — Z5111 Encounter for antineoplastic chemotherapy: Secondary | ICD-10-CM | POA: Diagnosis not present

## 2015-09-21 DIAGNOSIS — C50212 Malignant neoplasm of upper-inner quadrant of left female breast: Secondary | ICD-10-CM

## 2015-09-21 LAB — CBC WITH DIFFERENTIAL/PLATELET
BASOS ABS: 0 10*3/uL (ref 0–0.1)
Eosinophils Absolute: 0 10*3/uL (ref 0–0.7)
Eosinophils Relative: 2 %
HCT: 23.5 % — ABNORMAL LOW (ref 35.0–47.0)
Hemoglobin: 7.9 g/dL — ABNORMAL LOW (ref 12.0–16.0)
Lymphocytes Relative: 66 %
Lymphs Abs: 0.6 10*3/uL — ABNORMAL LOW (ref 1.0–3.6)
MCH: 28.9 pg (ref 26.0–34.0)
MCHC: 33.7 g/dL (ref 32.0–36.0)
MCV: 85.8 fL (ref 80.0–100.0)
Monocytes Absolute: 0 10*3/uL — ABNORMAL LOW (ref 0.2–0.9)
Monocytes Relative: 3 %
NEUTROS ABS: 0.3 10*3/uL — AB (ref 1.4–6.5)
Platelets: 178 10*3/uL (ref 150–440)
RBC: 2.74 MIL/uL — ABNORMAL LOW (ref 3.80–5.20)
RDW: 15.1 % — AB (ref 11.5–14.5)
WBC: 1 10*3/uL — CL (ref 3.6–11.0)

## 2015-09-21 LAB — COMPREHENSIVE METABOLIC PANEL
ALBUMIN: 3.2 g/dL — AB (ref 3.5–5.0)
ALT: 13 U/L — ABNORMAL LOW (ref 14–54)
ANION GAP: 9 (ref 5–15)
AST: 21 U/L (ref 15–41)
Alkaline Phosphatase: 320 U/L — ABNORMAL HIGH (ref 38–126)
BUN: 23 mg/dL — ABNORMAL HIGH (ref 6–20)
CO2: 22 mmol/L (ref 22–32)
Calcium: 8.5 mg/dL — ABNORMAL LOW (ref 8.9–10.3)
Chloride: 102 mmol/L (ref 101–111)
Creatinine, Ser: 6.05 mg/dL — ABNORMAL HIGH (ref 0.44–1.00)
GFR calc non Af Amer: 7 mL/min — ABNORMAL LOW (ref >60)
GFR, EST AFRICAN AMERICAN: 8 mL/min — AB (ref >60)
GLUCOSE: 91 mg/dL (ref 65–99)
POTASSIUM: 3.9 mmol/L (ref 3.5–5.1)
SODIUM: 133 mmol/L — AB (ref 135–145)
TOTAL PROTEIN: 6.9 g/dL (ref 6.5–8.1)
Total Bilirubin: 0.5 mg/dL (ref 0.3–1.2)

## 2015-09-21 NOTE — Telephone Encounter (Signed)
Continue to monitor per Dr. Grayland Ormond. Will add neulasta to next chemo treatment.

## 2015-09-21 NOTE — Telephone Encounter (Signed)
Pt has critical result of ANC 0.3. Please advise. Last chemo 9/7.

## 2015-09-22 ENCOUNTER — Emergency Department: Payer: Medicare Other

## 2015-09-22 ENCOUNTER — Emergency Department
Admission: EM | Admit: 2015-09-22 | Discharge: 2015-09-22 | Disposition: A | Discharge status: Home / Self Care | Payer: Medicare Other | Attending: Emergency Medicine | Admitting: Emergency Medicine

## 2015-09-22 ENCOUNTER — Other Ambulatory Visit: Payer: Self-pay

## 2015-09-22 DIAGNOSIS — N186 End stage renal disease: Secondary | ICD-10-CM | POA: Diagnosis not present

## 2015-09-22 DIAGNOSIS — Z853 Personal history of malignant neoplasm of breast: Secondary | ICD-10-CM | POA: Insufficient documentation

## 2015-09-22 DIAGNOSIS — I509 Heart failure, unspecified: Secondary | ICD-10-CM | POA: Insufficient documentation

## 2015-09-22 DIAGNOSIS — R0602 Shortness of breath: Secondary | ICD-10-CM | POA: Diagnosis present

## 2015-09-22 DIAGNOSIS — I132 Hypertensive heart and chronic kidney disease with heart failure and with stage 5 chronic kidney disease, or end stage renal disease: Secondary | ICD-10-CM | POA: Diagnosis not present

## 2015-09-22 DIAGNOSIS — Z992 Dependence on renal dialysis: Secondary | ICD-10-CM | POA: Insufficient documentation

## 2015-09-22 DIAGNOSIS — D573 Sickle-cell trait: Secondary | ICD-10-CM | POA: Insufficient documentation

## 2015-09-22 DIAGNOSIS — I87009 Postthrombotic syndrome without complications of unspecified extremity: Secondary | ICD-10-CM | POA: Insufficient documentation

## 2015-09-22 DIAGNOSIS — Z79899 Other long term (current) drug therapy: Secondary | ICD-10-CM | POA: Insufficient documentation

## 2015-09-22 LAB — BASIC METABOLIC PANEL
ANION GAP: 12 (ref 5–15)
BUN: 9 mg/dL (ref 6–20)
CHLORIDE: 102 mmol/L (ref 101–111)
CO2: 23 mmol/L (ref 22–32)
Calcium: 8.6 mg/dL — ABNORMAL LOW (ref 8.9–10.3)
Creatinine, Ser: 3.4 mg/dL — ABNORMAL HIGH (ref 0.44–1.00)
GFR calc Af Amer: 16 mL/min — ABNORMAL LOW (ref >60)
GFR calc non Af Amer: 14 mL/min — ABNORMAL LOW (ref >60)
GLUCOSE: 111 mg/dL — AB (ref 65–99)
POTASSIUM: 3.4 mmol/L — AB (ref 3.5–5.1)
Sodium: 137 mmol/L (ref 135–145)

## 2015-09-22 LAB — DIFFERENTIAL
BAND NEUTROPHILS: 0 %
BASOS PCT: 2 %
BLASTS: 0 %
Basophils Absolute: 0 10*3/uL (ref 0–0.1)
EOS PCT: 3 %
Eosinophils Absolute: 0 10*3/uL (ref 0–0.7)
LYMPHS PCT: 59 %
Lymphs Abs: 0.6 10*3/uL — ABNORMAL LOW (ref 1.0–3.6)
MONO ABS: 0.1 10*3/uL — AB (ref 0.2–0.9)
Metamyelocytes Relative: 0 %
Monocytes Relative: 7 %
Myelocytes: 0 %
NRBC: 0 /100{WBCs}
Neutro Abs: 0.3 10*3/uL — ABNORMAL LOW (ref 1.4–6.5)
Neutrophils Relative %: 29 %
OTHER: 0 %
PROMYELOCYTES ABS: 0 %

## 2015-09-22 LAB — CBC
HEMATOCRIT: 25.2 % — AB (ref 35.0–47.0)
HEMOGLOBIN: 8.6 g/dL — AB (ref 12.0–16.0)
MCH: 29.1 pg (ref 26.0–34.0)
MCHC: 34.1 g/dL (ref 32.0–36.0)
MCV: 85.3 fL (ref 80.0–100.0)
Platelets: 218 10*3/uL (ref 150–440)
RBC: 2.95 MIL/uL — ABNORMAL LOW (ref 3.80–5.20)
RDW: 14.9 % — AB (ref 11.5–14.5)
WBC: 1 10*3/uL — AB (ref 3.6–11.0)

## 2015-09-22 LAB — PROTIME-INR
INR: 3.65
PROTHROMBIN TIME: 37.2 s — AB (ref 11.4–15.2)

## 2015-09-22 LAB — TROPONIN I: Troponin I: <0.03 ng/mL (ref <0.03)

## 2015-09-22 NOTE — Discharge Instructions (Signed)
Please seek medical attention for any high fevers, chest pain, shortness of breath, change in behavior, persistent vomiting, bloody stool or any other new or concerning symptoms.  

## 2015-09-22 NOTE — ED Notes (Signed)
Pt. Verbalizes understanding of d/c instructions, and follow-up. VS stable and pain controlled per pt.  Pt. In NAD at time of d/c and denies further concerns regarding this visit. Pt. Stable at the time of departure from the unit, departing unit by the safest and most appropriate manner per that pt condition and limitations. Pt advised to return to the ED at any time for emergent concerns, or for new/worsening symptoms.   

## 2015-09-22 NOTE — ED Notes (Signed)
Pt. Reports about 2 hours after leaving dialysis, SOB and raspy voice, along with wheezing began. This is "the warning sign when I have too much fluid on my lungs and I wasn't going to come but if I didn't I would be brought by the EMTs there is no doubt".

## 2015-09-22 NOTE — ED Provider Notes (Signed)
[facility] Emergency Department Provider Note   ____________________________________________   I have reviewed the triage vital signs and the nursing notes.   HISTORY  Chief Complaint Shortness of Breath   History limited by: Not Limited   HPI Teresa Cook is a 60 y.o. female who presents to the emergency department today because of concern for fluid on her lungs. The patient states that she thinks she has fluid on her lungs because of some shortness of breath, dialysis staff saying she has fluid on her lungs, and that she is having similar symptoms that usually occur before she has fluid on her lungs (raspy voice). She has been under her dry weight the past few weeks so dialysis has been deferred. They did try taking some fluid off today. The patient has had some associated cough. She denies any fevers. She was found to be neutropenic on blood work a couple of days ago.   Past Medical History:  Diagnosis Date  . Anemia   . Anxiety   . Breast cancer ([place])   . CHF (congestive heart failure) ([place])   . Chronic kidney disease   . Depression   . Dialysis patient ([place])   . DVT (deep venous thrombosis) (H[facility])    left leg  . DVT (deep venous thrombosis) ([place])   . Gout   . HTN (hypertension)   . Hypertension   . Parathyroid abnormality ([place])   . Parathyroid disease ([place])   . Psoriasis   . Renal insufficiency   . Sickle cell anemia (HCC)    traits    Patient Active Problem List   Diagnosis Date Noted  . Chest pain 08/31/2015  . SIRS (systemic inflammatory response syndrome) ([place]) 08/27/2015  . Breast cancer of upper-inner quadrant of left female breast ([place]) 08/05/2015  . Pulmonary edema 07/09/2015  . Dizziness 03/03/2015  . Seizures ([place]) 03/03/2015  . Spells ([address] [address]) 11/11/2014  . Acute respiratory failure ([place]) 09/19/2014  . Left ventricular dysfunction 09/06/2014  . Acute pulmonary edema ([place]) 08/30/2014  . Respiratory failure ([place]) 08/29/2014   . Respiratory difficulty 08/29/2014  . Chronic systolic heart failure ([place]) 05/16/2014  . Hypertension 05/16/2014  . Renal failure 05/16/2014  . Anxiety 05/16/2014  . Sickle cell trait ([place]) 03/03/2014  . ESRD (end stage renal disease) on dialysis ([place]) 11/02/2013  . Dysgeusia 01/27/2013  . Weight loss 01/27/2013  . Resting tremor 08/13/2012  . Depression 04/30/2012  . 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 Right leg DVT ([place]) 04/30/2012    Past Surgical History:  Procedure Laterality Date  . ABDOMINAL HYSTERECTOMY    . APPENDECTOMY    . BREAST BIOPSY Left 10/28/2013   benign  . BREAST EXCISIONAL BIOPSY Left 2002   benign  . INSERTION OF DIALYSIS CATHETER  2014  . PARTIAL HYSTERECTOMY    . PORTACATH PLACEMENT Left 08/21/2015   Procedure: INSERTION PORT-A-CATH;  Surgeon: Hubbard Robinson, MD;  Location: [facility] ORS;  Service: General;  Laterality: Left;    Prior to Admission medications   Medication Sig Start Date End Date Taking? Authorizing Provider  clonazePAM (KLONOPIN) 1 MG tablet Take 0.5-1 mg by mouth daily.  08/23/15   Historical Provider, MD  HYDROcodone-acetaminophen (NORCO/VICODIN) 5-325 MG tablet Take 1-2 tablets by mouth every 4 (four) hours as needed for moderate pain or severe pain. 08/21/15   Hubbard Robinson, MD  levofloxacin (LEVAQUIN) 250 MG tablet Take 1 tablet (250 mg total) by mouth every other day. 08/29/15   Sona  Posey Pronto, MD  lidocaine-prilocaine (EMLA) cream Apply 1 application topically as needed. 08/22/15   [name], MD  losartan (COZAAR) 50 MG tablet Take 100 mg by mouth daily. Takes on non-dialysis days=Tuesday, Thursday, Saturday and Sunday. 09/07/14   Historical Provider, MD  metoprolol (LOPRESSOR) 100 MG tablet Take 50 mg by mouth 2 (two) times daily. Takes on non-dialysis days=Tuesday, Thursday, Saturday and Sunday.    Historical Provider, MD  ondansetron (ZOFRAN) 8 MG tablet Take 1 tablet (8 mg total) by mouth every 8 (eight)  hours as needed for nausea or vomiting. 08/24/15   [name], MD  pantoprazole (PROTONIX) 40 MG tablet Take 1 tablet (40 mg total) by mouth 2 (two) times daily before a meal. 08/31/15   Hillary Bow, MD  prochlorperazine (COMPAZINE) 10 MG tablet Take 1 tablet (10 mg total) by mouth every 6 (six) hours as needed for nausea or vomiting. 08/24/15   [name], MD  SENSIPAR 60 MG tablet Take 120 mg by mouth daily.  08/24/15   Historical Provider, MD  sertraline (ZOLOFT) 100 MG tablet Take 100 mg by mouth at bedtime.  03/05/12   Historical Provider, MD  sevelamer carbonate (RENVELA) 800 MG tablet Take 2,400 mg by mouth 3 (three) times daily with meals.     Historical Provider, MD  traMADol (ULTRAM) 50 MG tablet Take 50 mg by mouth every 6 (six) hours as needed.  02/20/15   Historical Provider, MD  warfarin (COUMADIN) 5 MG tablet Take 1 tablet Friday, Saturday and Sundays. Take 1 1/2 tablets the other days of the week 11/03/13   Historical Provider, MD  zolpidem (AMBIEN) 10 MG tablet Take 10 mg by mouth at bedtime as needed for sleep.    Historical Provider, MD    Allergies Adhesive [tape]  Family History  Problem Relation Age of Onset  . Stroke Mother   . CVA Mother   . Hypertension Mother   . Hypertension Father   . Hypertension Sister   . Diabetes Sister   . Cancer Sister 28    Breast  . Stroke Brother   . Hypertension Brother   . Breast cancer Maternal Aunt 70    Social History Social History  Substance Use Topics  . Smoking status: Never Smoker  . Smokeless tobacco: Never Used  . Alcohol use No    Review of Systems  Constitutional: Negative for fever. Cardiovascular: Negative for chest pain. Respiratory: Positive for shortness of breath. Gastrointestinal: Negative for abdominal pain, vomiting and diarrhea. Genitourinary: Negative for dysuria. Musculoskeletal: Negative for back pain. Skin: Negative for rash. Neurological: Negative for headaches, focal weakness  or numbness.  10-point ROS otherwise negative.  ____________________________________________   PHYSICAL EXAM:  VITAL SIGNS: ED Triage Vitals [09/22/15 1729]  Enc Vitals Group     BP (!) 171/101     Pulse Rate 93     Resp (!) 26     Temp 99.2 F (37.3 C)     Temp Source Oral     SpO2 98 %     Weight 149 lb (67.6 kg)     Height 5\' 6"  (1.676 m)     Head Circumference      [place] Flow      Pain Score 10   Constitutional: Alert and oriented. Well appearing and in no distress. Eyes: Conjunctivae are normal. Normal extraocular movements. ENT   Head: Normocephalic and atraumatic.   Nose: No congestion/rhinnorhea.   Mouth/Throat: Mucous membranes are moist.  Neck: No stridor. Hematological/Lymphatic/Immunilogical: No cervical lymphadenopathy. Cardiovascular: Normal rate, regular rhythm.  No murmurs, rubs, or gallops. Respiratory: Normal respiratory effort without tachypnea nor retractions. Breath sounds are clear and equal bilaterally. No wheezes/rales/rhonchi. Gastrointestinal: Soft and nontender. No distention.  Genitourinary: Deferred Musculoskeletal: Normal range of motion in all extremities. No lower extremity edema. Neurologic:  Normal speech and language. No gross focal neurologic deficits are appreciated.  Skin:  Skin is warm, dry and intact. No rash noted. Psychiatric: Mood and affect are normal. Speech and behavior are normal. Patient exhibits appropriate insight and judgment.  ____________________________________________    LABS (pertinent positives/negatives)  Labs Reviewed  BASIC METABOLIC PANEL - Abnormal; Notable for the following:       Result Value   Potassium 3.4 (*)    Glucose, Bld 111 (*)    Creatinine, Ser 3.40 (*)    Calcium 8.6 (*)    GFR calc non Af Amer 14 (*)    GFR calc Af Amer 16 (*)    All other components within normal limits  CBC - Abnormal; Notable for the following:    WBC 1.0 (*)    RBC 2.95 (*)    Hemoglobin 8.6 (*)     HCT 25.2 (*)    RDW 14.9 (*)    All other components within normal limits  PROTIME-INR - Abnormal; Notable for the following:    Prothrombin Time 37.2 (*)    All other components within normal limits  DIFFERENTIAL - Abnormal; Notable for the following:    Neutro Abs 0.3 (*)    Lymphs Abs 0.6 (*)    Monocytes Absolute 0.1 (*)    All other components within normal limits  TROPONIN I     ____________________________________________   EKG  I, Nance Pear, attending physician, personally viewed and interpreted this EKG  EKG Time: 1725 Rate: 94 Rhythm: normal sinus rhythm Axis: left axis deviation Intervals: qtc 465 QRS: narrow, q waves V1-V3 ST changes: no st elevation Impression: abnormal ekg  ____________________________________________    RADIOLOGY  CXR IMPRESSION:  No acute cardiopulmonary process.    Aortic vascular calcifications.      ___________________________________________   PROCEDURES  Procedures  ____________________________________________   INITIAL IMPRESSION / ASSESSMENT AND PLAN / ED COURSE  Pertinent labs & imaging results that were available during my care of the patient were reviewed by me and considered in my medical decision making (see chart for details).  Patient presented to the emergency department today because of concerns for shortness of breath and fluid on her lungs. Lung auscultation did not reveal any crackles. Chest x-ray without any pulmonary edema. Patient was neutropenic. Discussed with Dr. Rogue Bussing with oncology. Given that patient did not have fever felt that this was something she could follow up with as an outpatient. Patient did not have any distress during her stay here in the emergency department. Discussed with patient risks of admission given her neutropenia. Will plan on discharging home to follow up with Dr. Grayland Ormond  ____________________________________________   FINAL CLINICAL IMPRESSION(S) / ED  DIAGNOSES  Final diagnoses:  SOB (shortness of breath)     Note: This dictation was prepared with Dragon dictation. Any transcriptional errors that result from this process are unintentional    Nance Pear, MD 09/22/15 2235

## 2015-09-22 NOTE — ED Triage Notes (Addendum)
Pt c/o acute SHOB that started 1 hour ago. Had dialysis today. Reports they have not pulled fluid in 2 weeks because under her dry weight. Also c/o headache. Denies chest pain. Receiving chemo for breast CA. Permcath to right anterior chest and port to left chest. tachypenic in triage. Hx dvt, on blood thinners.

## 2015-10-04 NOTE — Progress Notes (Signed)
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  INTERVAL HISTORY: Patient returns to clinic today for further evaluation and consideration of cycle 3 of 4 of neoadjuvant Taxotere and Cytoxan. She was recently in the emergency room for increasing shortness of breath, but did not require admission to the hospital. She currently feels well and is back to her baseline, although she admits to increased weakness and fatigue. She continues to receive dialysis on Mondays, Wednesdays, and Fridays. She has no neurologic complaints. She denies any recent fevers. She has a good appetite and denies weight loss. She has no chest pain or shortness of breath. She denies any nausea, vomiting, constipation, or diarrhea. Patient offers no further specific complaints.  REVIEW OF SYSTEMS:   Review of Systems  Constitutional: Positive for malaise/fatigue. Negative for fever and weight loss.  Respiratory: Negative.  Negative for cough and shortness of breath.   Cardiovascular: Negative.  Negative for chest pain.  Gastrointestinal: Negative.  Negative for abdominal pain.  Genitourinary: Negative.   Musculoskeletal: Negative.   Neurological: Positive for weakness.  Psychiatric/Behavioral: The patient is nervous/anxious.     As per HPI. Otherwise, a complete review of systems is negative.  PAST MEDICAL HISTORY: Past Medical History:  Diagnosis Date  . Anemia   . Anxiety   . Breast cancer ([place])   . CHF (congestive heart failure) ([place])   . Chronic kidney disease   . Depression   . Dialysis patient ([place])   . DVT (deep venous thrombosis) (H[facility])    left leg  . DVT (deep  venous thrombosis) ([place])   . Gout   . HTN (hypertension)   . Hypertension   . Parathyroid abnormality ([place])   . Parathyroid disease ([place])   . Psoriasis   . Renal insufficiency   . Sickle cell anemia ([facility])    traits    PAST SURGICAL HISTORY: Past Surgical History:  Procedure Laterality Date  . ABDOMINAL HYSTERECTOMY    . APPENDECTOMY    . BREAST BIOPSY Left 10/28/2013   benign  . BREAST EXCISIONAL BIOPSY Left 2002   benign  . INSERTION OF DIALYSIS CATHETER  2014  . PARTIAL HYSTERECTOMY    . PORTACATH PLACEMENT Left 08/21/2015   Procedure: INSERTION PORT-A-CATH;  Surgeon: Hubbard Robinson, MD;  Location: [facility] ORS;  Service: General;  Laterality: Left;    FAMILY HISTORY: Family History  Problem Relation Age of Onset  . Stroke Mother   . CVA Mother   . Hypertension Mother   . Hypertension Father   . Hypertension Sister   . Diabetes Sister   . Cancer Sister 39    Breast  . Stroke Brother   . Hypertension Brother   . Breast cancer Maternal Aunt 24       ADVANCED DIRECTIVES:    HEALTH MAINTENANCE: Social History  Substance Use Topics  . Smoking status: Never Smoker  . Smokeless tobacco: Never Used  . Alcohol use No     Colonoscopy:  PAP:  Bone density:  Lipid panel:  Allergies  Allergen Reactions  . Adhesive [Tape] Itching    Paper tape is ok to use.    Current Outpatient Prescriptions  Medication Sig Dispense Refill  .  clonazePAM (KLONOPIN) 1 MG tablet Take 0.5-1 mg by mouth daily.     Marland Kitchen HYDROcodone-acetaminophen (NORCO/VICODIN) 5-325 MG tablet Take 1-2 tablets by mouth every 4 (four) hours as needed for moderate pain or severe pain. 20 tablet 0  . levofloxacin (LEVAQUIN) 250 MG tablet Take 1 tablet (250 mg total) by mouth every other day. 3 tablet 0  . lidocaine-prilocaine (EMLA) cream Apply 1 application topically as needed. 30 g 2  . losartan (COZAAR) 50 MG tablet Take 100 mg by mouth daily. Takes on non-dialysis days=Tuesday, Thursday, Saturday  and Sunday.    . metoprolol (LOPRESSOR) 100 MG tablet Take 50 mg by mouth 2 (two) times daily. Takes on non-dialysis days=Tuesday, Thursday, Saturday and Sunday.    . ondansetron (ZOFRAN) 8 MG tablet Take 1 tablet (8 mg total) by mouth every 8 (eight) hours as needed for nausea or vomiting. 30 tablet 2  . pantoprazole (PROTONIX) 40 MG tablet Take 1 tablet (40 mg total) by mouth 2 (two) times daily before a meal. 60 tablet 0  . prochlorperazine (COMPAZINE) 10 MG tablet Take 1 tablet (10 mg total) by mouth every 6 (six) hours as needed for nausea or vomiting. 30 tablet 2  . SENSIPAR 60 MG tablet Take 120 mg by mouth daily.     . sertraline (ZOLOFT) 100 MG tablet Take 100 mg by mouth at bedtime.     . sevelamer carbonate (RENVELA) 800 MG tablet Take 2,400 mg by mouth 3 (three) times daily with meals.     . traMADol (ULTRAM) 50 MG tablet Take 50 mg by mouth every 6 (six) hours as needed.     . warfarin (COUMADIN) 5 MG tablet Take 1 tablet Friday, Saturday and Sundays. Take 1 1/2 tablets the other days of the week    . zolpidem (AMBIEN) 10 MG tablet Take 10 mg by mouth at bedtime as needed for sleep.    . megestrol (MEGACE) 40 MG tablet Take 1 tablet (40 mg total) by mouth daily. 30 tablet 3   No current facility-administered medications for this visit.     OBJECTIVE: Vitals:   10/05/15 1028  BP: (!) 166/109  Pulse: 89  Resp: 20  Temp: (!) 96.2 F (35.7 C)     Body mass index is 23.18 kg/m.    ECOG FS:0 - Asymptomatic  General: Well-developed, well-nourished, no acute distress. Eyes: Pink conjunctiva, anicteric sclera. Breasts: Patient refused exam today. Lungs: Clear to auscultation bilaterally. Chest wall: Dialysis catheter in right chest wall, port noted in left chest wall. Heart: Regular rate and rhythm. No rubs, murmurs, or gallops. Abdomen: Soft, nontender, nondistended. No organomegaly noted, normoactive bowel sounds. Musculoskeletal: No edema, cyanosis, or clubbing. Neuro:  Alert, answering all questions appropriately. Cranial nerves grossly intact. Skin: No rashes or petechiae noted. Psych: Normal affect.   LAB RESULTS:  Lab Results  Component Value Date   NA 140 10/05/2015   K 3.3 (L) 10/05/2015   CL 106 10/05/2015   CO2 24 10/05/2015   GLUCOSE 112 (H) 10/05/2015   BUN 21 (H) 10/05/2015   CREATININE 5.36 (H) 10/05/2015   CALCIUM 8.4 (L) 10/05/2015   PROT 7.0 10/05/2015   ALBUMIN 3.5 10/05/2015   AST 28 10/05/2015   ALT 21 10/05/2015   ALKPHOS 447 (H) 10/05/2015   BILITOT 0.4 10/05/2015   GFRNONAA 8 (L) 10/05/2015   GFRAA 9 (L) 10/05/2015    Lab Results  Component Value Date   WBC 6.7 10/05/2015   NEUTROABS 4.3 10/05/2015  HGB 8.6 (L) 10/05/2015   HCT 25.4 (L) 10/05/2015   MCV 85.3 10/05/2015   PLT 191 10/05/2015     STUDIES: Dg Chest 2 View  Result Date: 09/22/2015 CLINICAL DATA:  Patient with shortness of breath.  Chest heaviness. EXAM: CHEST  2 VIEW COMPARISON:  Chest radiograph 08/31/2015 FINDINGS: Left anterior chest wall Port-A-Cath is present with tip projecting over the superior vena cava. Central venous catheter tip projects over the superior vena cava. Stable cardiac and mediastinal contours with tortuosity and calcification of the thoracic aorta. No consolidative pulmonary opacities. No pleural effusion or pneumothorax. Thoracic spine degenerative changes. Aortic vascular calcifications. IMPRESSION: No acute cardiopulmonary process. Aortic vascular calcifications. Electronically Signed   By: Lovey Newcomer M.D.   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  PLAN:    1. 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 Proceed with cycle 3 of 4 of Taxotere and Cytoxan today. Cytoxan will be  dose reduce 50% secondary to ongoing dialysis. Neulasta has been discontinued at patient request. Once patient completes her treatment, will refer back to surgery. Patient may or may not require adjuvant XRT depending on her final pathology results and whether or not she pursues lumpectomy or mastectomy. Finally, at the conclusion all of patient's treatments she will benefit from an aromatase inhibitor for total 5 years. Return to clinic in 3 weeks for consideration of cycle 2. 2. End-stage renal disease: Continue dialysis on Monday, Wednesday, and Fridays. 3. Anemia: Likely multifactorial. Monitor. 4. Bone pain/chills: Possibly secondary to Neulasta. Discontinue as above. 5. Hypertension: Patient states she did not take her medication this morning.  Approximately 30 minutes was spent in discussion of which greater than 50% was consultation.   Patient expressed understanding and was in agreement with this plan. She also understands that She can call clinic at any time with any questions, concerns, or complaints.   Breast cancer of upper-inner quadrant of left female breast [facility])   Staging form: Breast, AJCC 7th Edition   - Clinical stage from 08/05/2015: Stage IIB (T2, N1, M0) - Signed by [name], MD on 08/05/2015  [name], MD   10/08/2015 9:05 AM

## 2015-10-05 ENCOUNTER — Inpatient Hospital Stay: Payer: Medicare Other

## 2015-10-05 ENCOUNTER — Inpatient Hospital Stay (HOSPITAL_BASED_OUTPATIENT_CLINIC_OR_DEPARTMENT_OTHER): Payer: Medicare Other | Admitting: Oncology

## 2015-10-05 VITALS — BP 166/109 | HR 89 | Temp 96.2°F | Resp 20 | Wt 143.6 lb

## 2015-10-05 DIAGNOSIS — I132 Hypertensive heart and chronic kidney disease with heart failure and with stage 5 chronic kidney disease, or end stage renal disease: Secondary | ICD-10-CM | POA: Diagnosis not present

## 2015-10-05 DIAGNOSIS — Z992 Dependence on renal dialysis: Secondary | ICD-10-CM

## 2015-10-05 DIAGNOSIS — C50212 Malignant neoplasm of upper-inner quadrant of left female breast: Secondary | ICD-10-CM

## 2015-10-05 DIAGNOSIS — Z5111 Encounter for antineoplastic chemotherapy: Secondary | ICD-10-CM | POA: Diagnosis not present

## 2015-10-05 DIAGNOSIS — D631 Anemia in chronic kidney disease: Secondary | ICD-10-CM

## 2015-10-05 DIAGNOSIS — N186 End stage renal disease: Secondary | ICD-10-CM

## 2015-10-05 DIAGNOSIS — Z79899 Other long term (current) drug therapy: Secondary | ICD-10-CM

## 2015-10-05 DIAGNOSIS — Z170 Estrogen receptor positive status [ER+]: Secondary | ICD-10-CM | POA: Diagnosis not present

## 2015-10-05 LAB — CBC WITH DIFFERENTIAL/PLATELET
Basophils Absolute: 0.1 10*3/uL (ref 0–0.1)
Basophils Relative: 1 %
EOS ABS: 0 10*3/uL (ref 0–0.7)
EOS PCT: 1 %
HCT: 25.4 % — ABNORMAL LOW (ref 35.0–47.0)
Hemoglobin: 8.6 g/dL — ABNORMAL LOW (ref 12.0–16.0)
LYMPHS ABS: 1.8 10*3/uL (ref 1.0–3.6)
LYMPHS PCT: 26 %
MCH: 29 pg (ref 26.0–34.0)
MCHC: 34 g/dL (ref 32.0–36.0)
MCV: 85.3 fL (ref 80.0–100.0)
MONOS PCT: 9 %
Monocytes Absolute: 0.6 10*3/uL (ref 0.2–0.9)
NEUTROS PCT: 63 %
Neutro Abs: 4.3 10*3/uL (ref 1.4–6.5)
PLATELETS: 191 10*3/uL (ref 150–440)
RBC: 2.98 MIL/uL — ABNORMAL LOW (ref 3.80–5.20)
RDW: 15.1 % — AB (ref 11.5–14.5)
WBC: 6.7 10*3/uL (ref 3.6–11.0)

## 2015-10-05 LAB — COMPREHENSIVE METABOLIC PANEL
ALT: 21 U/L (ref 14–54)
ANION GAP: 10 (ref 5–15)
AST: 28 U/L (ref 15–41)
Albumin: 3.5 g/dL (ref 3.5–5.0)
Alkaline Phosphatase: 447 U/L — ABNORMAL HIGH (ref 38–126)
BUN: 21 mg/dL — ABNORMAL HIGH (ref 6–20)
CALCIUM: 8.4 mg/dL — AB (ref 8.9–10.3)
CHLORIDE: 106 mmol/L (ref 101–111)
CO2: 24 mmol/L (ref 22–32)
Creatinine, Ser: 5.36 mg/dL — ABNORMAL HIGH (ref 0.44–1.00)
GFR calc non Af Amer: 8 mL/min — ABNORMAL LOW (ref >60)
GFR, EST AFRICAN AMERICAN: 9 mL/min — AB (ref >60)
Glucose, Bld: 112 mg/dL — ABNORMAL HIGH (ref 65–99)
Potassium: 3.3 mmol/L — ABNORMAL LOW (ref 3.5–5.1)
SODIUM: 140 mmol/L (ref 135–145)
Total Bilirubin: 0.4 mg/dL (ref 0.3–1.2)
Total Protein: 7 g/dL (ref 6.5–8.1)

## 2015-10-05 MED ORDER — MEGESTROL ACETATE 40 MG PO TABS: 40.0000 mg | ORAL_TABLET | Freq: Every day | ORAL | 3 refills | Status: DC

## 2015-10-05 MED ORDER — SODIUM CHLORIDE 0.9 % IV SOLN
10.0000 mg | Freq: Once | INTRAVENOUS | Status: AC
  Administered 2015-10-05: 10 mg via INTRAVENOUS
  Filled 2015-10-05: qty 1

## 2015-10-05 MED ORDER — PALONOSETRON HCL INJECTION 0.25 MG/5ML
0.2500 mg | Freq: Once | INTRAVENOUS | Status: AC
  Administered 2015-10-05: 0.25 mg via INTRAVENOUS
  Filled 2015-10-05: qty 5

## 2015-10-05 MED ORDER — SODIUM CHLORIDE 0.9 % IV SOLN
Freq: Once | INTRAVENOUS | Status: AC
  Administered 2015-10-05: 12:00:00 via INTRAVENOUS
  Filled 2015-10-05: qty 1000

## 2015-10-05 MED ORDER — SODIUM CHLORIDE 0.9 % IV SOLN
500.0000 mg | Freq: Once | INTRAVENOUS | Status: AC
  Administered 2015-10-05: 500 mg via INTRAVENOUS
  Filled 2015-10-05: qty 25

## 2015-10-05 MED ORDER — DOCETAXEL CHEMO INJECTION 160 MG/16ML
75.0000 mg/m2 | Freq: Once | INTRAVENOUS | Status: AC
  Administered 2015-10-05: 130 mg via INTRAVENOUS
  Filled 2015-10-05: qty 13

## 2015-10-05 MED ORDER — HEPARIN SOD (PORK) LOCK FLUSH 100 UNIT/ML IV SOLN
500.0000 [IU] | Freq: Once | INTRAVENOUS | Status: AC | PRN
Start: 2015-10-05 — End: 2015-10-05
  Administered 2015-10-05: 500 [IU]
  Filled 2015-10-05: qty 5

## 2015-10-05 NOTE — Progress Notes (Signed)
Dr. Grayland Ormond aware of labs today.  Creat 5.36.  Gave order to proceed with treatment.

## 2015-10-19 ENCOUNTER — Ambulatory Visit: Payer: Medicare Other | Admitting: Oncology

## 2015-10-19 ENCOUNTER — Other Ambulatory Visit: Payer: Medicare Other

## 2015-10-19 ENCOUNTER — Ambulatory Visit: Payer: Medicare Other

## 2015-10-25 NOTE — Progress Notes (Signed)
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  INTERVAL HISTORY: Patient returns to clinic today for further evaluation and consideration of cycle 4 of 4 of neoadjuvant Taxotere and Cytoxan. She currently feels well and at her baseline, although she admits to persistent weakness and fatigue. She continues to receive dialysis on Mondays, Wednesdays, and Fridays. She has no neurologic complaints. She denies any recent fevers. She has a good appetite and denies weight loss. She has no chest pain or shortness of breath. She denies any nausea, vomiting, constipation, or diarrhea. Patient offers no further specific complaints.  REVIEW OF SYSTEMS:   Review of Systems  Constitutional: Positive for malaise/fatigue. Negative for fever and weight loss.  Respiratory: Negative.  Negative for cough and shortness of breath.   Cardiovascular: Negative.  Negative for chest pain.  Gastrointestinal: Negative.  Negative for abdominal pain.  Genitourinary: Negative.   Musculoskeletal: Negative.   Neurological: Positive for weakness.  Psychiatric/Behavioral: The patient is nervous/anxious.     As per HPI. Otherwise, a complete review of systems is negative.  PAST MEDICAL HISTORY: Past Medical History:  Diagnosis Date  . Anemia   . Anxiety   . Breast cancer ([place])   . CHF (congestive heart failure) ([place])   . Chronic kidney disease   . Depression   . Dialysis patient ([place])   . DVT (deep venous thrombosis) ([facility])    left leg  . DVT (deep venous thrombosis) ([place])   . Gout   . HTN (hypertension)   . Hypertension   . Parathyroid abnormality ([place])   . Parathyroid  disease ([place])   . Psoriasis   . Renal insufficiency   . Sickle cell anemia ([facility])    traits    PAST SURGICAL HISTORY: Past Surgical History:  Procedure Laterality Date  . ABDOMINAL HYSTERECTOMY    . APPENDECTOMY    . BREAST BIOPSY Left 10/28/2013   benign  . BREAST EXCISIONAL BIOPSY Left 2002   benign  . INSERTION OF DIALYSIS CATHETER  2014  . PARTIAL HYSTERECTOMY    . PORTACATH PLACEMENT Left 08/21/2015   Procedure: INSERTION PORT-A-CATH;  Surgeon: Hubbard Robinson, MD;  Location: [facility] ORS;  Service: General;  Laterality: Left;    FAMILY HISTORY: Family History  Problem Relation Age of Onset  . Stroke Mother   . CVA Mother   . Hypertension Mother   . Hypertension Father   . Hypertension Sister   . Diabetes Sister   . Cancer Sister 74    Breast  . Stroke Brother   . Hypertension Brother   . Breast cancer Maternal Aunt 70       ADVANCED DIRECTIVES:    HEALTH MAINTENANCE: Social History  Substance Use Topics  . Smoking status: Never Smoker  . Smokeless tobacco: Never Used  . Alcohol use No     Colonoscopy:  PAP:  Bone density:  Lipid panel:  Allergies  Allergen Reactions  . Adhesive [Tape] Itching    Paper tape is ok to use.    Current Outpatient Prescriptions  Medication Sig Dispense Refill  . clonazePAM (KLONOPIN) 1 MG tablet Take 0.5-1 mg by mouth daily.     Marland Kitchen HYDROcodone-acetaminophen (NORCO/VICODIN) 5-325 MG tablet  Take 1-2 tablets by mouth every 4 (four) hours as needed for moderate pain or severe pain. 20 tablet 0  . lidocaine-prilocaine (EMLA) cream Apply 1 application topically as needed. 30 g 2  . losartan (COZAAR) 50 MG tablet Take 100 mg by mouth daily. Takes on non-dialysis days=Tuesday, Thursday, Saturday and Sunday.    . metoprolol (LOPRESSOR) 100 MG tablet Take 50 mg by mouth 2 (two) times daily. Takes on non-dialysis days=Tuesday, Thursday, Saturday and Sunday.    . ondansetron (ZOFRAN) 8 MG tablet Take 1 tablet (8 mg total) by mouth  every 8 (eight) hours as needed for nausea or vomiting. 30 tablet 2  . pantoprazole (PROTONIX) 40 MG tablet Take 1 tablet (40 mg total) by mouth 2 (two) times daily before a meal. 60 tablet 0  . prochlorperazine (COMPAZINE) 10 MG tablet Take 1 tablet (10 mg total) by mouth every 6 (six) hours as needed for nausea or vomiting. 30 tablet 2  . SENSIPAR 60 MG tablet Take 120 mg by mouth daily.     . sertraline (ZOLOFT) 100 MG tablet Take 100 mg by mouth at bedtime.     . sevelamer carbonate (RENVELA) 800 MG tablet Take 2,400 mg by mouth 3 (three) times daily with meals.     . traMADol (ULTRAM) 50 MG tablet Take 50 mg by mouth every 6 (six) hours as needed.     . warfarin (COUMADIN) 5 MG tablet Take 1 tablet Friday, Saturday and Sundays. Take 1 1/2 tablets the other days of the week    . zolpidem (AMBIEN) 10 MG tablet Take 10 mg by mouth at bedtime as needed for sleep.    . megestrol (MEGACE) 40 MG tablet Take 1 tablet (40 mg total) by mouth daily. (Patient not taking: Reported on 10/26/2015) 30 tablet 3   No current facility-administered medications for this visit.     OBJECTIVE: Vitals:   10/26/15 0933  BP: (!) 159/80  Pulse: 94  Resp: 18  Temp: 99.1 F (37.3 C)     Body mass index is 22.88 kg/m.    ECOG FS:0 - Asymptomatic  General: Well-developed, well-nourished, no acute distress. Eyes: Pink conjunctiva, anicteric sclera. Breasts: Patient refused exam today. Lungs: Clear to auscultation bilaterally. Chest wall: Dialysis catheter in right chest wall, port noted in left chest wall. Heart: Regular rate and rhythm. No rubs, murmurs, or gallops. Abdomen: Soft, nontender, nondistended. No organomegaly noted, normoactive bowel sounds. Musculoskeletal: No edema, cyanosis, or clubbing. Neuro: Alert, answering all questions appropriately. Cranial nerves grossly intact. Skin: No rashes or petechiae noted. Psych: Normal affect.   LAB RESULTS:  Lab Results  Component Value Date   NA 135  10/26/2015   K 3.9 10/26/2015   CL 101 10/26/2015   CO2 24 10/26/2015   GLUCOSE 95 10/26/2015   BUN 30 (H) 10/26/2015   CREATININE 5.55 (H) 10/26/2015   CALCIUM 8.6 (L) 10/26/2015   PROT 6.6 10/26/2015   ALBUMIN 3.2 (L) 10/26/2015   AST 19 10/26/2015   ALT 15 10/26/2015   ALKPHOS 409 (H) 10/26/2015   BILITOT 0.5 10/26/2015   GFRNONAA 7 (L) 10/26/2015   GFRAA 9 (L) 10/26/2015    Lab Results  Component Value Date   WBC 5.6 10/26/2015   NEUTROABS 4.4 10/26/2015   HGB 7.4 (L) 10/26/2015   HCT 21.8 (L) 10/26/2015   MCV 83.4 10/26/2015   PLT 165 10/26/2015     STUDIES: No results found.  ASSESSMENT: Clinical stage IIB ER/PR positive HER-2  negative adenocarcinoma of the upper inner quadrant of the left breast.  PLAN:    1. 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 Proceed with cycle 4 of 4 of Taxotere and Cytoxan today. Cytoxan Was dose reduced 50% secondary to ongoing dialysis. Neulasta has been discontinued per patient request. Patient has now completed her neoadjuvant treatment and a referral was made back to surgery. Patient may or may not require adjuvant XRT depending on her final pathology results and whether or not she pursues lumpectomy or mastectomy. Finally, at the conclusion all of patient's treatments she will benefit from an aromatase inhibitor for total 5 years. Return to clinic in 1-2 weeks postoperatively to discuss her pathology results and treatment planning.  2. End-stage renal disease: Continue dialysis on Monday, Wednesday, and Fridays. 3. Anemia: Likely multifactorial. Monitor. 4. Bone pain/chills: Possibly secondary to Neulasta. Discontinue as above. 5. Hypertension: Improved, monitor.   Approximately 30 minutes was spent in discussion of which greater than 50% was consultation.   Patient expressed  understanding and was in agreement with this plan. She also understands that She can call clinic at any time with any questions, concerns, or complaints.   Breast cancer of upper-inner quadrant of left female breast [facility])   Staging form: Breast, AJCC 7th Edition   - Clinical stage from 08/05/2015: Stage IIB (T2, N1, M0) - Signed by [name], MD on 08/05/2015  [name], MD   11/01/2015 5:13 PM

## 2015-10-26 ENCOUNTER — Inpatient Hospital Stay: Payer: Medicare Other | Attending: Oncology

## 2015-10-26 ENCOUNTER — Inpatient Hospital Stay (HOSPITAL_BASED_OUTPATIENT_CLINIC_OR_DEPARTMENT_OTHER): Payer: Medicare Other | Admitting: Oncology

## 2015-10-26 ENCOUNTER — Inpatient Hospital Stay: Payer: Medicare Other

## 2015-10-26 VITALS — BP 159/80 | HR 94 | Temp 99.1°F | Resp 18 | Wt 141.8 lb

## 2015-10-26 VITALS — BP 122/82 | HR 88 | Temp 98.0°F | Resp 20

## 2015-10-26 DIAGNOSIS — F419 Anxiety disorder, unspecified: Secondary | ICD-10-CM | POA: Insufficient documentation

## 2015-10-26 DIAGNOSIS — N186 End stage renal disease: Secondary | ICD-10-CM | POA: Insufficient documentation

## 2015-10-26 DIAGNOSIS — D649 Anemia, unspecified: Secondary | ICD-10-CM

## 2015-10-26 DIAGNOSIS — F329 Major depressive disorder, single episode, unspecified: Secondary | ICD-10-CM | POA: Diagnosis not present

## 2015-10-26 DIAGNOSIS — I132 Hypertensive heart and chronic kidney disease with heart failure and with stage 5 chronic kidney disease, or end stage renal disease: Secondary | ICD-10-CM

## 2015-10-26 DIAGNOSIS — Z79899 Other long term (current) drug therapy: Secondary | ICD-10-CM | POA: Insufficient documentation

## 2015-10-26 DIAGNOSIS — C50212 Malignant neoplasm of upper-inner quadrant of left female breast: Secondary | ICD-10-CM | POA: Insufficient documentation

## 2015-10-26 DIAGNOSIS — Z992 Dependence on renal dialysis: Secondary | ICD-10-CM

## 2015-10-26 DIAGNOSIS — Z170 Estrogen receptor positive status [ER+]: Secondary | ICD-10-CM | POA: Insufficient documentation

## 2015-10-26 DIAGNOSIS — Z5111 Encounter for antineoplastic chemotherapy: Secondary | ICD-10-CM | POA: Diagnosis present

## 2015-10-26 DIAGNOSIS — M109 Gout, unspecified: Secondary | ICD-10-CM | POA: Diagnosis not present

## 2015-10-26 LAB — COMPREHENSIVE METABOLIC PANEL
ALBUMIN: 3.2 g/dL — AB (ref 3.5–5.0)
ALT: 15 U/L (ref 14–54)
AST: 19 U/L (ref 15–41)
Alkaline Phosphatase: 409 U/L — ABNORMAL HIGH (ref 38–126)
Anion gap: 10 (ref 5–15)
BUN: 30 mg/dL — AB (ref 6–20)
CHLORIDE: 101 mmol/L (ref 101–111)
CO2: 24 mmol/L (ref 22–32)
CREATININE: 5.55 mg/dL — AB (ref 0.44–1.00)
Calcium: 8.6 mg/dL — ABNORMAL LOW (ref 8.9–10.3)
GFR calc non Af Amer: 7 mL/min — ABNORMAL LOW (ref >60)
GFR, EST AFRICAN AMERICAN: 9 mL/min — AB (ref >60)
GLUCOSE: 95 mg/dL (ref 65–99)
Potassium: 3.9 mmol/L (ref 3.5–5.1)
SODIUM: 135 mmol/L (ref 135–145)
Total Bilirubin: 0.5 mg/dL (ref 0.3–1.2)
Total Protein: 6.6 g/dL (ref 6.5–8.1)

## 2015-10-26 LAB — CBC WITH DIFFERENTIAL/PLATELET
BASOS ABS: 0 10*3/uL (ref 0–0.1)
BASOS PCT: 1 %
EOS ABS: 0 10*3/uL (ref 0–0.7)
EOS PCT: 0 %
HCT: 21.8 % — ABNORMAL LOW (ref 35.0–47.0)
HEMOGLOBIN: 7.4 g/dL — AB (ref 12.0–16.0)
Lymphocytes Relative: 10 %
Lymphs Abs: 0.5 10*3/uL — ABNORMAL LOW (ref 1.0–3.6)
MCH: 28.3 pg (ref 26.0–34.0)
MCHC: 33.9 g/dL (ref 32.0–36.0)
MCV: 83.4 fL (ref 80.0–100.0)
Monocytes Absolute: 0.6 10*3/uL (ref 0.2–0.9)
Monocytes Relative: 10 %
NEUTROS PCT: 79 %
Neutro Abs: 4.4 10*3/uL (ref 1.4–6.5)
PLATELETS: 165 10*3/uL (ref 150–440)
RBC: 2.62 MIL/uL — AB (ref 3.80–5.20)
RDW: 15.8 % — ABNORMAL HIGH (ref 11.5–14.5)
WBC: 5.6 10*3/uL (ref 3.6–11.0)

## 2015-10-26 LAB — PREPARE RBC (CROSSMATCH)

## 2015-10-26 MED ORDER — PALONOSETRON HCL INJECTION 0.25 MG/5ML
0.2500 mg | Freq: Once | INTRAVENOUS | Status: AC
  Administered 2015-10-26: 0.25 mg via INTRAVENOUS
  Filled 2015-10-26: qty 5

## 2015-10-26 MED ORDER — DIPHENHYDRAMINE HCL 50 MG/ML IJ SOLN
25.0000 mg | Freq: Once | INTRAMUSCULAR | Status: AC
  Administered 2015-10-26: 25 mg via INTRAVENOUS
  Filled 2015-10-26: qty 1

## 2015-10-26 MED ORDER — ACETAMINOPHEN 325 MG PO TABS
650.0000 mg | ORAL_TABLET | Freq: Once | ORAL | Status: AC
  Administered 2015-10-26: 650 mg via ORAL
  Filled 2015-10-26: qty 2

## 2015-10-26 MED ORDER — DEXAMETHASONE SODIUM PHOSPHATE 10 MG/ML IJ SOLN
10.0000 mg | Freq: Once | INTRAMUSCULAR | Status: AC
  Administered 2015-10-26: 10 mg via INTRAVENOUS
  Filled 2015-10-26: qty 1

## 2015-10-26 MED ORDER — DOCETAXEL CHEMO INJECTION 160 MG/16ML
75.0000 mg/m2 | Freq: Once | INTRAVENOUS | Status: AC
  Administered 2015-10-26: 130 mg via INTRAVENOUS
  Filled 2015-10-26: qty 13

## 2015-10-26 MED ORDER — SODIUM CHLORIDE 0.9 % IV SOLN
Freq: Once | INTRAVENOUS | Status: AC
  Administered 2015-10-26: 12:00:00 via INTRAVENOUS
  Filled 2015-10-26: qty 1000

## 2015-10-26 MED ORDER — SODIUM CHLORIDE 0.9 % IV SOLN
250.0000 mL | Freq: Once | INTRAVENOUS | Status: AC
  Administered 2015-10-26: 250 mL via INTRAVENOUS
  Filled 2015-10-26: qty 250

## 2015-10-26 MED ORDER — SODIUM CHLORIDE 0.9 % IV SOLN
500.0000 mg | Freq: Once | INTRAVENOUS | Status: AC
  Administered 2015-10-26: 500 mg via INTRAVENOUS
  Filled 2015-10-26: qty 25

## 2015-10-26 MED ORDER — SODIUM CHLORIDE 0.9 % IV SOLN: 10.0000 mg | Freq: Once | INTRAVENOUS | Status: DC

## 2015-10-26 MED ORDER — HEPARIN SOD (PORK) LOCK FLUSH 100 UNIT/ML IV SOLN
500.0000 [IU] | Freq: Once | INTRAVENOUS | Status: DC | PRN
  Filled 2015-10-26: qty 5

## 2015-10-26 NOTE — Progress Notes (Signed)
States has noticed stabbing pain in right upper abdomen that started last night. Decreased appetite and decreased oral intake. Has prescription for megace but has not started taking yet.

## 2015-10-27 LAB — TYPE AND SCREEN
ABO/RH(D): O POS
Antibody Screen: NEGATIVE
Unit division: 0

## 2015-10-30 ENCOUNTER — Telehealth: Payer: Self-pay

## 2015-10-30 DIAGNOSIS — Z170 Estrogen receptor positive status [ER+]: Principal | ICD-10-CM

## 2015-10-30 DIAGNOSIS — C50912 Malignant neoplasm of unspecified site of left female breast: Secondary | ICD-10-CM

## 2015-10-30 NOTE — Telephone Encounter (Signed)
Patient has been advised of all information in the previous note. Patient was concerned that she may not have transportation to [place] for the imaging, however she was going to check with family members. She will call me if for any reason she needs to reschedule. The address to the [facility] has been given to the patient.

## 2015-10-30 NOTE — Telephone Encounter (Signed)
Patient needs to be scheduled for surgical consult with Dr. Azalee Course. I spoke with Dr. Azalee Course in regards to this. She has ordered a MRI of left breast and would like patient to follow-up afterwards.   The patient attends Dialysis Monday, Wednesday, Friday at this time.   MRI of Breast is scheduled at [facility] on (Tuesday) 11/14/15 at 1000am. Arrival at 0945am. No prep for testing.   Patient will follow-up in [facility] on 11/23/15 at 10:15am. Please have patient arrive at 10:00am.  Please call patient will all information above.

## 2015-10-30 NOTE — Telephone Encounter (Signed)
I have called patient to go over the information in the previous note for appointments. No answer. I have left a message on voicemail for patient to call back.

## 2015-11-13 ENCOUNTER — Other Ambulatory Visit: Payer: Self-pay | Admitting: Surgery

## 2015-11-13 ENCOUNTER — Telehealth: Payer: Self-pay

## 2015-11-13 DIAGNOSIS — Z853 Personal history of malignant neoplasm of breast: Secondary | ICD-10-CM

## 2015-11-13 NOTE — Telephone Encounter (Signed)
Received call from [facility] regarding MRI scheduled for patient Teresa Cook. Due to Creatinine level of 5.5  they are unable to proceed with MRI. Patient is on Dialysis.

## 2015-11-13 NOTE — Telephone Encounter (Signed)
Spoke with Dr.Loflin at this time to let her know that I received a call from [facility] regarding scheduled MRI and contrast on this patient. The patients creatinine is 5.5 and the radiologist stated they would not proceed with MRI and contrast.   Dr.Loflin told me to call them back and let them know she wanted to proceed with MRI and contrast and the patient could be dialyzed .  Message has been sent to [place] via text and email with Dr.Jackson's ( Radiologist) contact number.   Also asked Dr.Loflin to let me know how to proceed.

## 2015-11-14 ENCOUNTER — Ambulatory Visit (HOSPITAL_COMMUNITY): Admission: RE | Admit: 2015-11-14 | Payer: Medicare Other | Source: Ambulatory Visit

## 2015-11-17 ENCOUNTER — Ambulatory Visit (HOSPITAL_COMMUNITY)
Admission: RE | Admit: 2015-11-17 | Discharge: 2015-11-17 | Disposition: A | Discharge status: Home / Self Care | Payer: Medicare Other | Source: Ambulatory Visit | Attending: Surgery | Admitting: Surgery

## 2015-11-17 DIAGNOSIS — C773 Secondary and unspecified malignant neoplasm of axilla and upper limb lymph nodes: Secondary | ICD-10-CM | POA: Diagnosis not present

## 2015-11-17 DIAGNOSIS — Z853 Personal history of malignant neoplasm of breast: Secondary | ICD-10-CM

## 2015-11-17 DIAGNOSIS — R921 Mammographic calcification found on diagnostic imaging of breast: Secondary | ICD-10-CM | POA: Insufficient documentation

## 2015-11-17 DIAGNOSIS — C50412 Malignant neoplasm of upper-outer quadrant of left female breast: Secondary | ICD-10-CM | POA: Diagnosis present

## 2015-11-17 MED ORDER — GADOBENATE DIMEGLUMINE 529 MG/ML IV SOLN
15.0000 mL | Freq: Once | INTRAVENOUS | Status: AC | PRN
  Administered 2015-11-17: 13 mL via INTRAVENOUS

## 2015-11-23 ENCOUNTER — Ambulatory Visit (INDEPENDENT_AMBULATORY_CARE_PROVIDER_SITE_OTHER): Payer: Medicare Other | Admitting: Surgery

## 2015-11-23 ENCOUNTER — Encounter: Payer: Self-pay | Admitting: Surgery

## 2015-11-23 VITALS — BP 172/102 | HR 96 | Temp 97.6°F | Wt 139.0 lb

## 2015-11-23 DIAGNOSIS — C50212 Malignant neoplasm of upper-inner quadrant of left female breast: Secondary | ICD-10-CM | POA: Diagnosis not present

## 2015-11-23 NOTE — Progress Notes (Signed)
Outpatient Surgical Follow Up  11/23/2015  Teresa Cook is an 60 y.o. female seen for the diagnosis of Malignant neoplasm of upper-inner quadrant of left female breast, unspecified estrogen receptor status ([place]) [C50.212]. She has completed her chemotherapy and gotten an MRI of bilateral breasts due to some suspicious calcifications that were in the air. I have spent sometimes being on the phone with the radiologist as well where there is a suspicious region in the right breast seen on MR that was not seen on mammogram. 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 The patient has been doing well and fatigue but otherwise has no complaints today she was hoping to go on and schedule her surgery and is frustrated about that but understands that we need to get full answers prior to being able to offer her the appropriate surgical options.  Past Medical History:  Diagnosis Date  . Anemia   . Anxiety   . Breast cancer ([place])   . CHF (congestive heart failure) ([place])   . Chronic kidney disease   . Depression   . Dialysis patient ([place])   . DVT (deep venous thrombosis) ([facility])    left leg  . DVT (deep venous thrombosis) ([place])   . Gout   . HTN (hypertension)   . Hypertension   . Parathyroid abnormality ([place])   . Parathyroid disease ([place])   . Psoriasis   . Renal insufficiency   . Sickle cell anemia ([facility])    traits    Past Surgical History:  Procedure Laterality Date  . ABDOMINAL HYSTERECTOMY    . APPENDECTOMY    . BREAST BIOPSY Left 10/28/2013   benign  . BREAST EXCISIONAL BIOPSY Left 2002   benign  . INSERTION OF DIALYSIS CATHETER  2014  . PARTIAL HYSTERECTOMY    . PORTACATH PLACEMENT Left 08/21/2015   Procedure: INSERTION PORT-A-CATH;  Surgeon: Hubbard Robinson, MD;  Location: [facility] ORS;  Service: General;  Laterality: Left;    Family History  Problem Relation Age of Onset  . Stroke Mother    . CVA Mother   . Hypertension Mother   . Hypertension Father   . Hypertension Sister   . Diabetes Sister   . Cancer Sister 48    Breast  . Stroke Brother   . Hypertension Brother   . Breast cancer Maternal Aunt 70    Social History:  reports that she has never smoked. She has never used smokeless tobacco. She reports that she does not drink alcohol or use drugs.  Allergies:  Allergies  Allergen Reactions  . Adhesive [Tape] Itching    Paper tape is ok to use.    Medications reviewed.  Physical Exam:  BP (!) 172/102   Pulse 96   Temp 97.6 F (36.4 C) (Oral)   Wt 139 lb (63 kg)   BMI 22.44 kg/m   Gen: patient resting comfortably in clinic, no cardiovascular or respiratory distress  Assessment/Plan: Teresa Cook is an 60 y.o. female seen for the diagnosis of Malignant neoplasm of upper-inner quadrant of left female breast, unspecified estrogen receptor status ([place]) [C50.212]. I discussed in depth with her the findings from her MRI which did show it to suspicious areas of calcifications in the left breast and a remarkable decrease in the size of the known left breast cancer but no change in the size of the 2 axillary masses that were positive for cancer. I discussed  with her that in order to have an idea of whether or not breast conservative therapy could be an option for her that we would need to get biopsies of the suspicious calcifications and ensure that they are cancer or DCIS. If these do come back as either we would not be able to offer breast conservative therapy and would only be able to do a mastectomy.  Additionally I discussed with her the only MRI she had a suspicious area in the right breast that has a linear calcification appearance concerning for possible DCIS as well. This did not show up on her previous mammograms. I have discussed with the radiologist as well that she would need an MR guided biopsy of this area to ensure we aren't dealing with an additional  area of DCIS or cancer and the ipsilateral breast.  The patient was given the opportunity to ask questions and have them answered while she is disappointed that we cannot go on and schedule surgery today she understands that there is more diagnostic work that needs to be done before we can determine if breast conservative therapy on the left is an option and if anything needs to be done to the right breast. I will have these biopsies scheduled and then have her follow-up with me in 2 weeks.  I have discussed this with Dr. Grayland Ormond as well who is in agreement with the plan.  Time spent with the patient was 25 minutes, with more than 50% of the time spent in face-to-face education, counseling and care coordination.      Adalbert Alberto L. Courtney Fenlon MD General Surgeon  11/23/2015,2:59 PM

## 2015-11-23 NOTE — Patient Instructions (Signed)
We will need to do an Ultrasound, mammogram, and biopsy of both masses in your Left Breast and an MRI guided biopsy of right breast.  We will call you when we get all of this scheduled to let you know when these appointments will be.

## 2015-11-27 ENCOUNTER — Telehealth: Payer: Self-pay | Admitting: Surgery

## 2015-11-27 DIAGNOSIS — N6310 Unspecified lump in the right breast, unspecified quadrant: Secondary | ICD-10-CM

## 2015-11-27 NOTE — Telephone Encounter (Signed)
Patient states that she has spoken with family members and has decided that it would be best to take the whole breast off. Please advise.

## 2015-11-28 ENCOUNTER — Other Ambulatory Visit: Payer: Self-pay

## 2015-11-28 ENCOUNTER — Other Ambulatory Visit: Payer: Self-pay | Admitting: Surgery

## 2015-11-28 DIAGNOSIS — N6310 Unspecified lump in the right breast, unspecified quadrant: Secondary | ICD-10-CM

## 2015-11-28 NOTE — Telephone Encounter (Signed)
Spoke with Dr. Azalee Course regarding this patient. Patient states that she would like to have a Mastectomy on the Left side only and surgeon is ok with this decision. However, Right side will still need MRI guided biopsy.  I explained that we still need to have the Right side biopsied under MRI guidance to determine if additional surgery would be needed on the right side during surgery. Patient verbalized understanding of this.  Call was made to [facility] for MRI guided biopsy of Right Breast. They do not do biopsy of breast guided by MRI at [facility]. They have given me number of [facility].  Call made to [place] [phone] at this time. Spoke with cherish. MRI guided biopsy scheduled for 12/06/15 at 0700 at [place].  Returned phone call to patient. All appointment information given to patient and she verbalized understanding of this. As soon as she has this imaging completed, she will then go to Dialysis to dialyze the contrast. Pt understands this as well.

## 2015-12-06 ENCOUNTER — Ambulatory Visit
Admission: RE | Admit: 2015-12-06 | Discharge: 2015-12-06 | Disposition: A | Discharge status: Home / Self Care | Payer: Medicare Other | Source: Ambulatory Visit | Attending: Surgery | Admitting: Surgery

## 2015-12-06 DIAGNOSIS — N6310 Unspecified lump in the right breast, unspecified quadrant: Secondary | ICD-10-CM

## 2015-12-06 MED ORDER — GADOBENATE DIMEGLUMINE 529 MG/ML IV SOLN
7.0000 mL | Freq: Once | INTRAVENOUS | Status: AC | PRN
  Administered 2015-12-06: 7 mL via INTRAVENOUS

## 2015-12-07 ENCOUNTER — Encounter: Payer: Self-pay | Admitting: Surgery

## 2015-12-07 ENCOUNTER — Ambulatory Visit (INDEPENDENT_AMBULATORY_CARE_PROVIDER_SITE_OTHER): Payer: Medicare Other | Admitting: Surgery

## 2015-12-07 ENCOUNTER — Telehealth: Payer: Self-pay

## 2015-12-07 VITALS — BP 156/96 | HR 101 | Temp 98.7°F | Ht 66.0 in | Wt 141.0 lb

## 2015-12-07 DIAGNOSIS — C50212 Malignant neoplasm of upper-inner quadrant of left female breast: Secondary | ICD-10-CM

## 2015-12-07 NOTE — Telephone Encounter (Signed)
Clearance to stop Coumadin prior to surgery has been faxed to Tawni Levy, NP [phone] at this time. Practitioner works at [employer] [phone].

## 2015-12-07 NOTE — Progress Notes (Signed)
Subjective:     Patient ID: Teresa Cook, female   DOB: August 02, 1955, 60 y.o.   MRN: [id number]  HPI  60 year old female seen for the diagnosis of Malignant neoplasm of upper-inner quadrant of left female breast, unspecified estrogen receptor status ([place]) [C50.212].  I saw the patient couple of weeks back after her MRI was performed that did show some suspicious areas in the right as well and discussed with her some of her options for her left breast.  She is very fatigued and is having some pins and needles and swollen sensation in her fingertips and her toes as well as the bottom of her feet. Patient does state that there is a burning sensation but mainly it does feel swollen and can be painful. Patient has had this since starting the chemotherapy. Patient also has some scaling and dryness on her skin which she has had after the chemotherapy as well.   Past Medical History:  Diagnosis Date  . Anemia   . Anxiety   . Breast cancer ([place])   . CHF (congestive heart failure) ([place])   . Chronic kidney disease   . Depression   . Dialysis patient ([place])   . DVT (deep venous thrombosis) ([facility])    left leg  . DVT (deep venous thrombosis) ([place])   . Gout   . HTN (hypertension)   . Hypertension   . Parathyroid abnormality ([place])   . Parathyroid disease ([place])   . Psoriasis   . Renal insufficiency   . Sickle cell anemia ([facility])    traits   Past Surgical History:  Procedure Laterality Date  . ABDOMINAL HYSTERECTOMY    . APPENDECTOMY    . BREAST BIOPSY Left 10/28/2013   benign  . BREAST EXCISIONAL BIOPSY Left 2002   benign  . INSERTION OF DIALYSIS CATHETER  2014  . PARTIAL HYSTERECTOMY    . PORTACATH PLACEMENT Left 08/21/2015   Procedure: INSERTION PORT-A-CATH;  Surgeon: Hubbard [place], MD;  Location: [facility] ORS;  Service: General;  Laterality: Left;   Family History  Problem Relation Age of Onset  . Stroke Mother   . CVA Mother   . Hypertension Mother   . Hypertension Father   .  Hypertension Sister   . Diabetes Sister   . Cancer Sister 85    Breast  . Stroke Brother   . Hypertension Brother   . Breast cancer Maternal Aunt 70   Social History   Social History  . Marital status: Married    Spouse name: N/A  . Number of children: N/A  . Years of education: N/A   Occupational History  . disabled    Social History Main Topics  . Smoking status: Never Smoker  . Smokeless tobacco: Never Used  . Alcohol use No  . Drug use: No  . Sexual activity: Not Asked   Other Topics Concern  . None   Social History Narrative   ** Merged History Encounter **        Current Outpatient Prescriptions:  .  clonazePAM (KLONOPIN) 1 MG tablet, Take 0.5-1 mg by mouth daily. , Disp: , Rfl:  .  lidocaine-prilocaine (EMLA) cream, Apply 1 application topically as needed., Disp: 30 g, Rfl: 2 .  losartan (COZAAR) 50 MG tablet, Take 100 mg by mouth daily. Takes on non-dialysis days=Tuesday, Thursday, Saturday and Sunday., Disp: , Rfl:  .  metoprolol (LOPRESSOR) 100 MG tablet, Take 50 mg by mouth 2 (two) times daily. Takes on non-dialysis days=Tuesday, Thursday, Saturday and  Sunday., Disp: , Rfl:  .  ondansetron (ZOFRAN) 8 MG tablet, Take 1 tablet (8 mg total) by mouth every 8 (eight) hours as needed for nausea or vomiting., Disp: 30 tablet, Rfl: 2 .  pantoprazole (PROTONIX) 40 MG tablet, Take 1 tablet (40 mg total) by mouth 2 (two) times daily before a meal., Disp: 60 tablet, Rfl: 0 .  prochlorperazine (COMPAZINE) 10 MG tablet, , Disp: , Rfl:  .  SENSIPAR 60 MG tablet, Take 120 mg by mouth daily. , Disp: , Rfl:  .  sertraline (ZOLOFT) 100 MG tablet, Take 100 mg by mouth at bedtime. , Disp: , Rfl:  .  sevelamer carbonate (RENVELA) 800 MG tablet, Take 2,400 mg by mouth 3 (three) times daily with meals. , Disp: , Rfl:  .  traMADol (ULTRAM) 50 MG tablet, Take 50 mg by mouth every 6 (six) hours as needed. , Disp: , Rfl:  .  warfarin (COUMADIN) 5 MG tablet, Take 1 tablet Friday, Saturday  and Sundays. Take 1 1/2 tablets the other days of the week, Disp: , Rfl:  .  zolpidem (AMBIEN) 10 MG tablet, Take 10 mg by mouth at bedtime as needed for sleep., Disp: , Rfl:  Allergies  Allergen Reactions  . Adhesive [Tape] Itching    Silk tape is ok to use.     Review of Systems  Constitutional: Positive for fatigue. Negative for activity change, appetite change, chills, fever and unexpected weight change.  HENT: Negative for congestion and sore throat.   Respiratory: Negative for cough, choking, shortness of breath and wheezing.   Cardiovascular: Negative for chest pain, palpitations and leg swelling.  Gastrointestinal: Negative for abdominal pain, constipation, diarrhea, nausea and vomiting.  Genitourinary: Negative for dysuria and hematuria.  Musculoskeletal: Positive for joint swelling. Negative for back pain and neck pain.  Skin: Positive for rash. Negative for color change, pallor and wound.  Neurological: Negative for dizziness and weakness.  Hematological: Negative for adenopathy. Does not bruise/bleed easily.  Psychiatric/Behavioral: Negative for agitation. The patient is not nervous/anxious.   All other systems reviewed and are negative.      Vitals:   12/07/15 0921  BP: (!) 156/96  Pulse: (!) 101  Temp: 98.7 F (37.1 C)    Objective:   Physical Exam  Constitutional: She is oriented to person, place, and time. She appears well-developed and well-nourished. No distress.  HENT:  Head: Normocephalic and atraumatic.  Right Ear: External ear normal.  Left Ear: External ear normal.  Nose: Nose normal.  Mouth/Throat: Oropharynx is clear and moist. No oropharyngeal exudate.  Eyes: Conjunctivae and EOM are normal. Pupils are equal, round, and reactive to light. No scleral icterus.  Neck: Normal range of motion. Neck supple. No tracheal deviation present.  Cardiovascular: Normal rate, regular rhythm, normal heart sounds and intact distal pulses.  Exam reveals no gallop  and no friction rub.   No murmur heard. Pulmonary/Chest: Effort normal and breath sounds normal. No respiratory distress. She has no wheezes. She has no rales.  Abdominal: Soft. Bowel sounds are normal. She exhibits no distension. There is no tenderness.  Musculoskeletal: Normal range of motion. She exhibits no edema or deformity.  Neurological: She is alert and oriented to person, place, and time. No cranial nerve deficit.  Skin: Skin is dry. Rash noted.  Catheter in place on right chest, left port placement with no issues, skin with a dry scaly type rash were the ridges between skin cells can be seen and decreased turgor and  the skin  Psychiatric: She has a normal mood and affect. Her behavior is normal. Judgment and thought content normal.  Vitals reviewed.      Assessment:     60 year old female with left breast metastatic cancer and biopsy of right breast    Plan:     I had an extensive discussion with the patient and her husband about her options for surgery with her breast cancer. The risk of recurrence is similar between mastectomy and lumpectomy with radiation. Unfortunately for the patient on the left side she will need a mastectomy to ensure that we can fully excise all the tissue and she would like a mastectomy. I discussed with her that we will need to inject some dye prior to and will need to take out her lymph nodes as well between 1 and 4. I also discussed with her that since she just recently had a biopsy on the right side that we would need to see if this comes back as either DCIS or cancer that if it does she will also have a choice between a lumpectomy or mastectomy on the right side as well.   I discussed risks of bleeding, infection, damage to surrounding tissues, having positive margins, needing further resection, damage to nerves causing arm numbness or difficulty raising arm, causing lymphoedema in the arm; as well as anesthesia risks of MI, stroke, prolonged  ventilation, pulmonary embolism, thrombosis and even death. Patient was given the opportunity to ask questions and have them answered. She would like to wait until after Christmas to have any operation if possible which I think is reasonable for her. We will tentatively plan for a left meniscectomy with sentinel lymph node biopsy and a possible right mastectomy or lumpectomy with sentinel lymph node biopsy on the right side for the third week in January. I will have her return in a couple weeks to discuss the right-sided biopsy results and ensure that there are no further questions.  Also of note we did discuss that she will need to be off her Coumadin for approximately 4 days prior and we will are going on and sending for clearance to stop her Coumadin and if she needs any bridging at this time. Additionally I will consult with the nephrology team to see when the best time during a week would be given her dialysis as well as if we can use the same sentinel lymph node dye's since she is a dialysis patient.  Time spent with the patient was 60 minutes, with more than 50% of the time spent in face-to-face education, counseling and care coordination.

## 2015-12-07 NOTE — Patient Instructions (Addendum)
We see you back as scheduled below to further discuss surgery.  We will call you with you pathology results as soon as we have those available.  We will send in your Gabapentin to your pharmacy. You may take this up to three times daily as needed.

## 2015-12-08 ENCOUNTER — Telehealth: Payer: Self-pay

## 2015-12-08 ENCOUNTER — Other Ambulatory Visit: Payer: Self-pay

## 2015-12-08 DIAGNOSIS — J189 Pneumonia, unspecified organism: Secondary | ICD-10-CM

## 2015-12-08 HISTORY — DX: Pneumonia, unspecified organism: J18.9

## 2015-12-08 MED ORDER — GABAPENTIN 100 MG PO CAPS: 200.0000 mg | ORAL_CAPSULE | Freq: Three times a day (TID) | ORAL | 0 refills | Status: DC | PRN

## 2015-12-08 NOTE — Telephone Encounter (Signed)
Medication resent to pharmacy and patient notified.

## 2015-12-08 NOTE — Telephone Encounter (Signed)
Pt called stating the pharmacy did not have her Gabapentin. It was suppose to have been sent after her appt yesterday. Please send to [facility] and notify her when this is done. I would have sent it but don't know how many refills Dr. Azalee Course would provide.

## 2015-12-13 ENCOUNTER — Telehealth: Payer: Self-pay | Admitting: *Deleted

## 2015-12-13 NOTE — Telephone Encounter (Signed)
I don't think she is a patient of mine

## 2015-12-13 NOTE — Telephone Encounter (Signed)
Ok, thanks.

## 2015-12-13 NOTE — Telephone Encounter (Signed)
Clearance to Stop Anti-Coagulant for surgery is obtained from Dr.Wessinger at this time to stop Coumadin 5 days prior to surgery. Scanned under Media.

## 2015-12-13 NOTE — Telephone Encounter (Signed)
Called to report that she went to kidney doctor today and he put her on abx for a cold and congestion. Denies fever. Just wanted to let us know

## 2015-12-14 NOTE — Telephone Encounter (Signed)
Spoke with Dr. Azalee Course in regards to patient's pathology results.  Spoke with patient and explained that results were benign. She was very relieved to hear this.   Informed her that she should think about and talk to her family about what she would like to do in regards to surgery options and we will discuss and finalize plans at 12/28/15 appointment with Dr. Azalee Course.

## 2015-12-15 ENCOUNTER — Encounter: Payer: Self-pay | Admitting: Emergency Medicine

## 2015-12-15 ENCOUNTER — Inpatient Hospital Stay
Admission: EM | Admit: 2015-12-15 | Discharge: 2015-12-25 | DRG: 314 | Disposition: A | Discharge status: Home Health | Payer: Medicare Other | Attending: Internal Medicine | Admitting: Internal Medicine

## 2015-12-15 ENCOUNTER — Emergency Department: Payer: Medicare Other

## 2015-12-15 DIAGNOSIS — I132 Hypertensive heart and chronic kidney disease with heart failure and with stage 5 chronic kidney disease, or end stage renal disease: Secondary | ICD-10-CM | POA: Diagnosis present

## 2015-12-15 DIAGNOSIS — R400 Somnolence: Secondary | ICD-10-CM | POA: Diagnosis not present

## 2015-12-15 DIAGNOSIS — J810 Acute pulmonary edema: Secondary | ICD-10-CM | POA: Diagnosis not present

## 2015-12-15 DIAGNOSIS — F419 Anxiety disorder, unspecified: Secondary | ICD-10-CM | POA: Diagnosis present

## 2015-12-15 DIAGNOSIS — I160 Hypertensive urgency: Secondary | ICD-10-CM | POA: Diagnosis present

## 2015-12-15 DIAGNOSIS — M109 Gout, unspecified: Secondary | ICD-10-CM | POA: Diagnosis present

## 2015-12-15 DIAGNOSIS — I161 Hypertensive emergency: Secondary | ICD-10-CM | POA: Diagnosis not present

## 2015-12-15 DIAGNOSIS — D571 Sickle-cell disease without crisis: Secondary | ICD-10-CM | POA: Diagnosis present

## 2015-12-15 DIAGNOSIS — Y95 Nosocomial condition: Secondary | ICD-10-CM | POA: Diagnosis present

## 2015-12-15 DIAGNOSIS — J9601 Acute respiratory failure with hypoxia: Secondary | ICD-10-CM | POA: Diagnosis not present

## 2015-12-15 DIAGNOSIS — J811 Chronic pulmonary edema: Secondary | ICD-10-CM | POA: Diagnosis not present

## 2015-12-15 DIAGNOSIS — T80211A Bloodstream infection due to central venous catheter, initial encounter: Secondary | ICD-10-CM

## 2015-12-15 DIAGNOSIS — A419 Sepsis, unspecified organism: Secondary | ICD-10-CM | POA: Diagnosis present

## 2015-12-15 DIAGNOSIS — Z992 Dependence on renal dialysis: Secondary | ICD-10-CM | POA: Diagnosis not present

## 2015-12-15 DIAGNOSIS — R109 Unspecified abdominal pain: Secondary | ICD-10-CM

## 2015-12-15 DIAGNOSIS — N2581 Secondary hyperparathyroidism of renal origin: Secondary | ICD-10-CM | POA: Diagnosis present

## 2015-12-15 DIAGNOSIS — R262 Difficulty in walking, not elsewhere classified: Secondary | ICD-10-CM

## 2015-12-15 DIAGNOSIS — Z7901 Long term (current) use of anticoagulants: Secondary | ICD-10-CM | POA: Diagnosis not present

## 2015-12-15 DIAGNOSIS — F329 Major depressive disorder, single episode, unspecified: Secondary | ICD-10-CM | POA: Diagnosis present

## 2015-12-15 DIAGNOSIS — E876 Hypokalemia: Secondary | ICD-10-CM | POA: Diagnosis not present

## 2015-12-15 DIAGNOSIS — I499 Cardiac arrhythmia, unspecified: Secondary | ICD-10-CM | POA: Diagnosis not present

## 2015-12-15 DIAGNOSIS — I129 Hypertensive chronic kidney disease with stage 1 through stage 4 chronic kidney disease, or unspecified chronic kidney disease: Secondary | ICD-10-CM | POA: Diagnosis not present

## 2015-12-15 DIAGNOSIS — C50212 Malignant neoplasm of upper-inner quadrant of left female breast: Secondary | ICD-10-CM | POA: Diagnosis present

## 2015-12-15 DIAGNOSIS — G629 Polyneuropathy, unspecified: Secondary | ICD-10-CM | POA: Diagnosis present

## 2015-12-15 DIAGNOSIS — Z86718 Personal history of other venous thrombosis and embolism: Secondary | ICD-10-CM | POA: Diagnosis not present

## 2015-12-15 DIAGNOSIS — Z79899 Other long term (current) drug therapy: Secondary | ICD-10-CM | POA: Diagnosis not present

## 2015-12-15 DIAGNOSIS — Y848 Other medical procedures as the cause of abnormal reaction of the patient, or of later complication, without mention of misadventure at the time of the procedure: Secondary | ICD-10-CM | POA: Diagnosis present

## 2015-12-15 DIAGNOSIS — Z9221 Personal history of antineoplastic chemotherapy: Secondary | ICD-10-CM

## 2015-12-15 DIAGNOSIS — R6521 Severe sepsis with septic shock: Secondary | ICD-10-CM | POA: Diagnosis present

## 2015-12-15 DIAGNOSIS — J189 Pneumonia, unspecified organism: Secondary | ICD-10-CM | POA: Diagnosis present

## 2015-12-15 DIAGNOSIS — Z4659 Encounter for fitting and adjustment of other gastrointestinal appliance and device: Secondary | ICD-10-CM

## 2015-12-15 DIAGNOSIS — G893 Neoplasm related pain (acute) (chronic): Secondary | ICD-10-CM | POA: Diagnosis present

## 2015-12-15 DIAGNOSIS — R0600 Dyspnea, unspecified: Secondary | ICD-10-CM | POA: Diagnosis not present

## 2015-12-15 DIAGNOSIS — Z170 Estrogen receptor positive status [ER+]: Secondary | ICD-10-CM | POA: Diagnosis not present

## 2015-12-15 DIAGNOSIS — J9690 Respiratory failure, unspecified, unspecified whether with hypoxia or hypercapnia: Secondary | ICD-10-CM

## 2015-12-15 DIAGNOSIS — N186 End stage renal disease: Secondary | ICD-10-CM | POA: Diagnosis present

## 2015-12-15 DIAGNOSIS — N189 Chronic kidney disease, unspecified: Secondary | ICD-10-CM | POA: Diagnosis not present

## 2015-12-15 DIAGNOSIS — Z9911 Dependence on respirator [ventilator] status: Secondary | ICD-10-CM | POA: Diagnosis not present

## 2015-12-15 DIAGNOSIS — D631 Anemia in chronic kidney disease: Secondary | ICD-10-CM | POA: Diagnosis present

## 2015-12-15 DIAGNOSIS — I509 Heart failure, unspecified: Secondary | ICD-10-CM | POA: Diagnosis present

## 2015-12-15 DIAGNOSIS — K219 Gastro-esophageal reflux disease without esophagitis: Secondary | ICD-10-CM | POA: Diagnosis present

## 2015-12-15 DIAGNOSIS — K279 Peptic ulcer, site unspecified, unspecified as acute or chronic, without hemorrhage or perforation: Secondary | ICD-10-CM | POA: Diagnosis present

## 2015-12-15 DIAGNOSIS — M6281 Muscle weakness (generalized): Secondary | ICD-10-CM

## 2015-12-15 LAB — URINALYSIS, COMPLETE (UACMP) WITH MICROSCOPIC
BILIRUBIN URINE: NEGATIVE
Bacteria, UA: NONE SEEN
GLUCOSE, UA: 50 mg/dL — AB
HGB URINE DIPSTICK: NEGATIVE
KETONES UR: NEGATIVE mg/dL
LEUKOCYTES UA: NEGATIVE
Nitrite: NEGATIVE
Protein, ur: 100 mg/dL — AB
Specific Gravity, Urine: 1.005 (ref 1.005–1.030)
pH: 9 — ABNORMAL HIGH (ref 5.0–8.0)

## 2015-12-15 LAB — CBC WITH DIFFERENTIAL/PLATELET
BASOS PCT: 1 %
Basophils Absolute: 0 10*3/uL (ref 0–0.1)
EOS ABS: 0.1 10*3/uL (ref 0–0.7)
Eosinophils Relative: 3 %
HCT: 21.4 % — ABNORMAL LOW (ref 35.0–47.0)
HEMOGLOBIN: 7.1 g/dL — AB (ref 12.0–16.0)
LYMPHS ABS: 0.5 10*3/uL — AB (ref 1.0–3.6)
Lymphocytes Relative: 13 %
MCH: 28.6 pg (ref 26.0–34.0)
MCHC: 33.3 g/dL (ref 32.0–36.0)
MCV: 86.1 fL (ref 80.0–100.0)
Monocytes Absolute: 0.5 10*3/uL (ref 0.2–0.9)
Monocytes Relative: 13 %
NEUTROS PCT: 70 %
Neutro Abs: 3 10*3/uL (ref 1.4–6.5)
Platelets: 142 10*3/uL — ABNORMAL LOW (ref 150–440)
RBC: 2.49 MIL/uL — AB (ref 3.80–5.20)
RDW: 17.5 % — ABNORMAL HIGH (ref 11.5–14.5)
WBC: 4.2 10*3/uL (ref 3.6–11.0)

## 2015-12-15 LAB — COMPREHENSIVE METABOLIC PANEL
ALT: 20 U/L (ref 14–54)
ANION GAP: 7 (ref 5–15)
AST: 27 U/L (ref 15–41)
Albumin: 3.1 g/dL — ABNORMAL LOW (ref 3.5–5.0)
Alkaline Phosphatase: 295 U/L — ABNORMAL HIGH (ref 38–126)
BUN: 11 mg/dL (ref 6–20)
CHLORIDE: 111 mmol/L (ref 101–111)
CO2: 24 mmol/L (ref 22–32)
CREATININE: 3.36 mg/dL — AB (ref 0.44–1.00)
Calcium: 9.5 mg/dL (ref 8.9–10.3)
GFR, EST AFRICAN AMERICAN: 16 mL/min — AB (ref >60)
GFR, EST NON AFRICAN AMERICAN: 14 mL/min — AB (ref >60)
Glucose, Bld: 110 mg/dL — ABNORMAL HIGH (ref 65–99)
POTASSIUM: 3.6 mmol/L (ref 3.5–5.1)
Sodium: 142 mmol/L (ref 135–145)
Total Bilirubin: 0.4 mg/dL (ref 0.3–1.2)
Total Protein: 6.4 g/dL — ABNORMAL LOW (ref 6.5–8.1)

## 2015-12-15 LAB — MAGNESIUM: Magnesium: 1.7 mg/dL (ref 1.7–2.4)

## 2015-12-15 LAB — PHOSPHORUS: PHOSPHORUS: 3.3 mg/dL (ref 2.5–4.6)

## 2015-12-15 LAB — INFLUENZA PANEL BY PCR (TYPE A & B)
Influenza A By PCR: NEGATIVE
Influenza B By PCR: NEGATIVE

## 2015-12-15 LAB — LACTIC ACID, PLASMA: LACTIC ACID, VENOUS: 1.1 mmol/L (ref 0.5–1.9)

## 2015-12-15 MED ORDER — PANTOPRAZOLE SODIUM 40 MG PO TBEC
40.0000 mg | DELAYED_RELEASE_TABLET | Freq: Two times a day (BID) | ORAL | Status: DC
  Administered 2015-12-16 (×2): 40 mg via ORAL
  Filled 2015-12-15 (×3): qty 1

## 2015-12-15 MED ORDER — ZOLPIDEM TARTRATE 5 MG PO TABS
10.0000 mg | ORAL_TABLET | Freq: Every evening | ORAL | Status: DC | PRN
  Administered 2015-12-15 – 2015-12-16 (×2): 10 mg via ORAL
  Filled 2015-12-15 (×2): qty 2

## 2015-12-15 MED ORDER — HEPARIN SODIUM (PORCINE) 5000 UNIT/ML IJ SOLN
5000.0000 [IU] | Freq: Three times a day (TID) | INTRAMUSCULAR | Status: DC
  Administered 2015-12-16 – 2015-12-17 (×4): 5000 [IU] via SUBCUTANEOUS
  Filled 2015-12-15 (×4): qty 1

## 2015-12-15 MED ORDER — WARFARIN SODIUM 5 MG PO TABS: 7.5000 mg | ORAL_TABLET | ORAL | Status: DC

## 2015-12-15 MED ORDER — BISACODYL 5 MG PO TBEC: 5.0000 mg | DELAYED_RELEASE_TABLET | Freq: Every day | ORAL | Status: DC | PRN

## 2015-12-15 MED ORDER — CEFEPIME-DEXTROSE 2 GM/50ML IV SOLR
2.0000 g | Freq: Once | INTRAVENOUS | Status: AC
  Administered 2015-12-15: 2 g via INTRAVENOUS
  Filled 2015-12-15: qty 50

## 2015-12-15 MED ORDER — CEFEPIME-DEXTROSE 1 GM/50ML IV SOLR
1.0000 g | Freq: Every day | INTRAVENOUS | Status: AC
Start: 2015-12-16 — End: 2015-12-21
  Administered 2015-12-16 – 2015-12-21 (×6): 1 g via INTRAVENOUS
  Filled 2015-12-15 (×8): qty 50

## 2015-12-15 MED ORDER — METOPROLOL TARTRATE 50 MG PO TABS
50.0000 mg | ORAL_TABLET | ORAL | Status: DC
  Administered 2015-12-16 – 2015-12-28 (×16): 50 mg via ORAL
  Filled 2015-12-15 (×18): qty 1

## 2015-12-15 MED ORDER — WARFARIN - PHARMACIST DOSING INPATIENT: Freq: Every day | Status: DC

## 2015-12-15 MED ORDER — HYDRALAZINE HCL 20 MG/ML IJ SOLN
INTRAMUSCULAR | Status: AC
  Administered 2015-12-15: 5 mg
  Filled 2015-12-15: qty 1

## 2015-12-15 MED ORDER — DM-GUAIFENESIN ER 30-600 MG PO TB12: 1.0000 | ORAL_TABLET | Freq: Two times a day (BID) | ORAL | Status: DC

## 2015-12-15 MED ORDER — SODIUM CHLORIDE 0.9% FLUSH
3.0000 mL | Freq: Two times a day (BID) | INTRAVENOUS | Status: DC
  Administered 2015-12-15 – 2015-12-22 (×10): 3 mL via INTRAVENOUS

## 2015-12-15 MED ORDER — ONDANSETRON HCL 4 MG PO TABS
8.0000 mg | ORAL_TABLET | Freq: Three times a day (TID) | ORAL | Status: DC | PRN
  Administered 2015-12-16 – 2015-12-17 (×2): 8 mg via ORAL
  Filled 2015-12-15 (×2): qty 2

## 2015-12-15 MED ORDER — DEXTROMETHORPHAN POLISTIREX ER 30 MG/5ML PO SUER
30.0000 mg | Freq: Two times a day (BID) | ORAL | Status: DC
  Administered 2015-12-16 (×3): 30 mg via ORAL
  Filled 2015-12-15 (×4): qty 5

## 2015-12-15 MED ORDER — MAGNESIUM CITRATE PO SOLN: 1.0000 | Freq: Once | ORAL | Status: DC | PRN

## 2015-12-15 MED ORDER — SODIUM CHLORIDE 0.9 % IV SOLN: INTRAVENOUS | Status: DC

## 2015-12-15 MED ORDER — SEVELAMER CARBONATE 800 MG PO TABS
2400.0000 mg | ORAL_TABLET | Freq: Three times a day (TID) | ORAL | Status: DC
  Administered 2015-12-16 (×3): 2400 mg via ORAL
  Filled 2015-12-15 (×4): qty 3

## 2015-12-15 MED ORDER — GABAPENTIN 100 MG PO CAPS: 200.0000 mg | ORAL_CAPSULE | Freq: Three times a day (TID) | ORAL | Status: DC | PRN

## 2015-12-15 MED ORDER — ACETAMINOPHEN 650 MG RE SUPP: 650.0000 mg | Freq: Four times a day (QID) | RECTAL | Status: DC | PRN

## 2015-12-15 MED ORDER — VANCOMYCIN HCL IN DEXTROSE 1-5 GM/200ML-% IV SOLN
1000.0000 mg | Freq: Once | INTRAVENOUS | Status: AC
  Administered 2015-12-15: 1000 mg via INTRAVENOUS
  Filled 2015-12-15: qty 200

## 2015-12-15 MED ORDER — LOSARTAN POTASSIUM 50 MG PO TABS
100.0000 mg | ORAL_TABLET | ORAL | Status: DC
  Administered 2015-12-16 – 2015-12-28 (×8): 100 mg via ORAL
  Filled 2015-12-15 (×9): qty 2

## 2015-12-15 MED ORDER — IPRATROPIUM-ALBUTEROL 0.5-2.5 (3) MG/3ML IN SOLN
3.0000 mL | Freq: Four times a day (QID) | RESPIRATORY_TRACT | Status: DC | PRN
  Administered 2015-12-16 – 2015-12-21 (×3): 3 mL via RESPIRATORY_TRACT
  Filled 2015-12-15 (×3): qty 3

## 2015-12-15 MED ORDER — PROCHLORPERAZINE MALEATE 10 MG PO TABS
10.0000 mg | ORAL_TABLET | Freq: Four times a day (QID) | ORAL | Status: DC | PRN
Start: 2015-12-15 — End: 2015-12-17
  Filled 2015-12-15: qty 1

## 2015-12-15 MED ORDER — HYDRALAZINE HCL 20 MG/ML IJ SOLN: 5.0000 mg | Freq: Once | INTRAMUSCULAR | Status: AC

## 2015-12-15 MED ORDER — CINACALCET HCL 30 MG PO TABS
120.0000 mg | ORAL_TABLET | Freq: Every day | ORAL | Status: DC
  Administered 2015-12-16: 120 mg via ORAL
  Filled 2015-12-15 (×2): qty 4

## 2015-12-15 MED ORDER — SERTRALINE HCL 100 MG PO TABS
100.0000 mg | ORAL_TABLET | Freq: Every day | ORAL | Status: DC
  Administered 2015-12-15 – 2015-12-17 (×3): 100 mg via ORAL
  Filled 2015-12-15 (×3): qty 1

## 2015-12-15 MED ORDER — HYDRALAZINE HCL 20 MG/ML IJ SOLN
INTRAMUSCULAR | Status: AC
  Administered 2015-12-15: 5 mg via INTRAVENOUS
  Filled 2015-12-15: qty 1

## 2015-12-15 MED ORDER — CLONAZEPAM 0.5 MG PO TABS
0.5000 mg | ORAL_TABLET | Freq: Every day | ORAL | Status: DC
Start: 2015-12-16 — End: 2015-12-17
  Administered 2015-12-16: 0.5 mg via ORAL
  Filled 2015-12-15 (×2): qty 1

## 2015-12-15 MED ORDER — TRAMADOL HCL 50 MG PO TABS
50.0000 mg | ORAL_TABLET | Freq: Four times a day (QID) | ORAL | Status: DC | PRN
  Administered 2015-12-16 (×3): 50 mg via ORAL
  Filled 2015-12-15 (×3): qty 1

## 2015-12-15 MED ORDER — VANCOMYCIN HCL IN DEXTROSE 750-5 MG/150ML-% IV SOLN
750.0000 mg | INTRAVENOUS | Status: AC
  Administered 2015-12-18 – 2015-12-27 (×6): 750 mg via INTRAVENOUS
  Filled 2015-12-15 (×9): qty 150

## 2015-12-15 MED ORDER — HEPARIN SODIUM (PORCINE) 5000 UNIT/ML IJ SOLN: 5000.0000 [IU] | Freq: Three times a day (TID) | INTRAMUSCULAR | Status: DC

## 2015-12-15 MED ORDER — VANCOMYCIN HCL 500 MG IV SOLR
500.0000 mg | Freq: Once | INTRAVENOUS | Status: AC
  Administered 2015-12-15: 500 mg via INTRAVENOUS
  Filled 2015-12-15: qty 500

## 2015-12-15 MED ORDER — GUAIFENESIN ER 600 MG PO TB12
600.0000 mg | ORAL_TABLET | Freq: Two times a day (BID) | ORAL | Status: DC
  Administered 2015-12-15 – 2015-12-16 (×3): 600 mg via ORAL
  Filled 2015-12-15 (×4): qty 1

## 2015-12-15 MED ORDER — LIDOCAINE-PRILOCAINE 2.5-2.5 % EX CREA
1.0000 "application " | TOPICAL_CREAM | CUTANEOUS | Status: DC | PRN
  Filled 2015-12-15: qty 5

## 2015-12-15 MED ORDER — SENNOSIDES-DOCUSATE SODIUM 8.6-50 MG PO TABS
1.0000 | ORAL_TABLET | Freq: Every evening | ORAL | Status: DC | PRN
  Administered 2015-12-20: 1 via ORAL
  Filled 2015-12-15: qty 1

## 2015-12-15 MED ORDER — WARFARIN SODIUM 5 MG PO TABS
5.0000 mg | ORAL_TABLET | ORAL | Status: DC
  Administered 2015-12-16: 5 mg via ORAL
  Filled 2015-12-15: qty 1

## 2015-12-15 MED ORDER — ENOXAPARIN SODIUM 30 MG/0.3ML ~~LOC~~ SOLN: 30.0000 mg | SUBCUTANEOUS | Status: DC

## 2015-12-15 MED ORDER — ACETAMINOPHEN 325 MG PO TABS
650.0000 mg | ORAL_TABLET | Freq: Four times a day (QID) | ORAL | Status: DC | PRN
  Administered 2015-12-16 – 2015-12-28 (×3): 650 mg via ORAL
  Filled 2015-12-15 (×3): qty 2

## 2015-12-15 MED ORDER — HYDRALAZINE HCL 20 MG/ML IJ SOLN
5.0000 mg | Freq: Once | INTRAMUSCULAR | Status: AC
  Administered 2015-12-15: 5 mg via INTRAVENOUS

## 2015-12-15 NOTE — ED Notes (Signed)
Blood culture draw #2: LEFT hand.

## 2015-12-15 NOTE — Progress Notes (Signed)
ANTICOAGULATION CONSULT NOTE - Initial Consult  Pharmacy Consult for VKA Indication: hx VTE  Allergies  Allergen Reactions  . Adhesive [Tape] Itching    Silk tape is ok to use.    Patient Measurements: Height: 5\' 6"  (167.6 cm) Weight: 137 lb 4.8 oz (62.3 kg) IBW/kg (Calculated) : 59.3 Heparin Dosing Weight:   Vital Signs: Temp: 99.9 F (37.7 C) (12/08 2238) Temp Source: Oral (12/08 2238) BP: 179/100 (12/08 2238) Pulse Rate: 108 (12/08 2238)  Labs:  Recent Labs  12/15/15 1952  HGB 7.1*  HCT 21.4*  PLT 142*  CREATININE 3.36*    Estimated Creatinine Clearance: 16.5 mL/min (by C-G formula based on SCr of 3.36 mg/dL (H)).   Medical History: Past Medical History:  Diagnosis Date  . Anemia   . Anxiety   . Breast cancer ([place])   . CHF (congestive heart failure) ([place])   . Chronic kidney disease   . Depression   . Dialysis patient ([place])   . DVT (deep venous thrombosis) ([facility])    left leg  . DVT (deep venous thrombosis) ([place])   . Gout   . HTN (hypertension)   . Hypertension   . Parathyroid abnormality ([place])   . Parathyroid disease ([place])   . Psoriasis   . Renal insufficiency   . Sickle cell anemia ([facility])    traits    Medications:  Infusions:  . sodium chloride      Assessment: 60 yof cc weakness with history of DVT x 2, on VKA OP. Pharmacy consulted to continue dosing. Note - patient has planned procedure with Dr. Azalee Course with permission to hold VKA 5 days before surgery, however, procedure planned for after Christmas. Will continue VKA dosing during this admission. [facility] has been ordered for VTE ppx but will be discontinued if INR therapeutic on admission.  Goal of Therapy:  INR 2-3 Monitor platelets by anticoagulation protocol: Yes   Plan:  Warfarin 5 mg po every Friday, Saturday, and Sunday; warfarin 7.5 mg po every Monday, Tuesday, Wednesday, and Thursday. Daily INR. DC [facility] if INR therapeutic on admission.  Laural Benes, Pharm.D., BCPS Clinical  Pharmacist 12/15/2015,11:05 PM

## 2015-12-15 NOTE — Progress Notes (Signed)
Pharmacy Antibiotic Note  Teresa Cook is a 60 y.o. female admitted on 12/15/2015 with sepsis.  Pharmacy has been consulted for cefepime and vancomycin dosing.  Plan: 1. Cefepime 2 gm IV x 1 in ED followed by cefepime 1 gm IV Q24H 2. Vancomycin 1 gm IV x 1 in ED, will give additional 500 mg for 1.5 gm load x 1, then start vancomycin 750 mg IV Q-dialysis (MWF). Vanc random scheduled for Friday 12/15. Pharmacy will continue to follow and adjust.   Height: 5\' 6"  (167.6 cm) Weight: 137 lb 4.8 oz (62.3 kg) IBW/kg (Calculated) : 59.3  Temp (24hrs), Avg:100.1 F (37.8 C), Min:99.9 F (37.7 C), Max:100.3 F (37.9 C)   Recent Labs Lab 12/15/15 1952  WBC 4.2  CREATININE 3.36*  LATICACIDVEN 1.1    Estimated Creatinine Clearance: 16.5 mL/min (by C-G formula based on SCr of 3.36 mg/dL (H)).    Allergies  Allergen Reactions  . Adhesive [Tape] Itching    Silk tape is ok to use.   Thank you for allowing pharmacy to be a part of this patient's care.  Laural Benes, Pharm.D., BCPS Clinical Pharmacist 12/15/2015 10:42 PM

## 2015-12-15 NOTE — ED Notes (Signed)
Code Sepsis was called to [place].  I spoke with Maudie Mercury.

## 2015-12-15 NOTE — Progress Notes (Signed)
Pharmacist - Prescriber Communication  Enoxaparin changed to heparin subcutaneous due to ESRD.  Teresa Cook A. [place], [place].D., BCPS Clinical Pharmacist

## 2015-12-15 NOTE — H&P (Signed)
[place] @ [facility] Admission History and Physical Harvie Bridge, D.O.  ---------------------------------------------------------------------------------------------------------------------   PATIENT NAME: Teresa Cook MR#: [id number] DATE OF BIRTH: 1955-12-28 DATE OF ADMISSION: 12/15/2015 PRIMARY CARE PHYSICIAN: Murlean Iba, MD  REQUESTING/REFERRING PHYSICIAN: ED Dr. Clearnce Hasten  CHIEF COMPLAINT: Chief Complaint  Patient presents with  . Weakness    HISTORY OF PRESENT ILLNESS: Teresa Cook is a 60 y.o. female with a known history of End-stage renal disease on hemodialysis, CHF, DVT of hypertension, breast cancer presents to the emergency department complaining of cough, congestion and fever.  Patient was in a usual state of health until one week ago when she developed worsening symptoms of chest congestion associated with cough productive of green sputum and fevers to 101 at home. She reports chest tightness and pain associated with coughing. She has been taking Augmentin for the past 3 days but has been worsening..  Patient finished chemotherapy in November 2017 and is planned for surgery in January 2018. Patient has dialysis Monday Wednesday and Friday. Last session was today.  Otherwise there has been no change in status. Patient has been taking medication as prescribed and there has been no recent change in medication or diet.  There has been no recent illness, travel or sick contacts.    Patient denies fevers/chills, weakness, dizziness, chest pain, shortness of breath, N/V/C/D, abdominal pain, dysuria/frequency, changes in mental status.   EMS/ED COURSE:   Patient received Maxipime and Vanco.  PAST MEDICAL HISTORY: Past Medical History:  Diagnosis Date  . Anemia   . Anxiety   . Breast cancer ([place])   . CHF (congestive heart failure) ([place])   . Chronic kidney disease   . Depression   . Dialysis patient ([place])   . DVT (deep venous thrombosis) (HCC)     left leg  . DVT (deep venous thrombosis) ([place])   . Gout   . HTN (hypertension)   . Hypertension   . Parathyroid abnormality ([place])   . Parathyroid disease ([place])   . Psoriasis   . Renal insufficiency   . Sickle cell anemia (HCC)    traits      PAST SURGICAL HISTORY: Past Surgical History:  Procedure Laterality Date  . ABDOMINAL HYSTERECTOMY    . APPENDECTOMY    . BREAST BIOPSY Left 10/28/2013   benign  . BREAST EXCISIONAL BIOPSY Left 2002   benign  . INSERTION OF DIALYSIS CATHETER  2014  . PARTIAL HYSTERECTOMY    . PORTACATH PLACEMENT Left 08/21/2015   Procedure: INSERTION PORT-A-CATH;  Surgeon: Hubbard Robinson, MD;  Location: [facility] ORS;  Service: General;  Laterality: Left;      SOCIAL HISTORY: Social History  Substance Use Topics  . Smoking status: Never Smoker  . Smokeless tobacco: Never Used  . Alcohol use No      FAMILY HISTORY: Family History  Problem Relation Age of Onset  . Stroke Mother   . CVA Mother   . Hypertension Mother   . Hypertension Father   . Hypertension Sister   . Diabetes Sister   . Cancer Sister 69    Breast  . Stroke Brother   . Hypertension Brother   . Breast cancer Maternal Aunt 70     MEDICATIONS AT HOME: Prior to Admission medications   Medication Sig Start Date End Date Taking? Authorizing Provider  amoxicillin-clavulanate (AUGMENTIN) 500-125 MG tablet Take 1 tablet by mouth daily. 12/13/15   Historical Provider, MD  clonazePAM (KLONOPIN) 1 MG tablet Take 0.5-1 mg by mouth  daily.  08/23/15   Historical Provider, MD  gabapentin (NEURONTIN) 100 MG capsule Take 2 capsules (200 mg total) by mouth 3 (three) times daily as needed. 12/08/15   Hubbard Robinson, MD  lidocaine-prilocaine (EMLA) cream Apply 1 application topically as needed. 08/22/15   [name], MD  losartan (COZAAR) 50 MG tablet Take 100 mg by mouth daily. Takes on non-dialysis days=Tuesday, Thursday, Saturday and Sunday. 09/07/14   Historical Provider, MD   metoprolol (LOPRESSOR) 100 MG tablet Take 50 mg by mouth 2 (two) times daily. Takes on non-dialysis days=Tuesday, Thursday, Saturday and Sunday.    Historical Provider, MD  ondansetron (ZOFRAN) 8 MG tablet Take 1 tablet (8 mg total) by mouth every 8 (eight) hours as needed for nausea or vomiting. 08/24/15   [name], MD  pantoprazole (PROTONIX) 40 MG tablet Take 1 tablet (40 mg total) by mouth 2 (two) times daily before a meal. 08/31/15   Hillary Bow, MD  prochlorperazine (COMPAZINE) 10 MG tablet  08/29/15   Historical Provider, MD  SENSIPAR 60 MG tablet Take 120 mg by mouth daily.  08/24/15   Historical Provider, MD  sertraline (ZOLOFT) 100 MG tablet Take 100 mg by mouth at bedtime.  03/05/12   Historical Provider, MD  sevelamer carbonate (RENVELA) 800 MG tablet Take 2,400 mg by mouth 3 (three) times daily with meals.     Historical Provider, MD  traMADol (ULTRAM) 50 MG tablet Take 50 mg by mouth every 6 (six) hours as needed.  02/20/15   Historical Provider, MD  warfarin (COUMADIN) 5 MG tablet Take 1 tablet Friday, Saturday and Sundays. Take 1 1/2 tablets the other days of the week 11/03/13   Historical Provider, MD  zolpidem (AMBIEN) 10 MG tablet Take 10 mg by mouth at bedtime as needed for sleep.    Historical Provider, MD      DRUG ALLERGIES: Allergies  Allergen Reactions  . Adhesive [Tape] Itching    Silk tape is ok to use.     REVIEW OF SYSTEMS: CONSTITUTIONAL: No fatigue, weakness, fever, chills, weight gain/loss, headache EYES: No blurry or double vision. ENT: No tinnitus, postnasal drip, redness or soreness of the oropharynx. RESPIRATORY: Positive dyspnea, cough, negative wheeze, hemoptysis. CARDIOVASCULAR: Positive chest pain associated with coughing., orthopnea, palpitations, syncope. GASTROINTESTINAL: No nausea, vomiting, constipation, diarrhea, abdominal pain. No hematemesis, melena or hematochezia. GENITOURINARY: No dysuria, frequency, hematuria. ENDOCRINE: No  polyuria or nocturia. No heat or cold intolerance. HEMATOLOGY: No anemia, bruising, bleeding. INTEGUMENTARY: No rashes, ulcers, lesions. MUSCULOSKELETAL: No pain, arthritis, swelling, gout. NEUROLOGIC: No numbness, tingling, weakness or ataxia. No seizure-type activity. PSYCHIATRIC: No anxiety, depression, insomnia.  PHYSICAL EXAMINATION: VITAL SIGNS: Blood pressure (!) 176/96, pulse (!) 115, temperature 100.2 F (37.9 C), temperature source Oral, resp. rate (!) 22, height 5\' 6"  (1.676 m), weight 64 kg (141 lb), SpO2 95 %.  GENERAL: 60 y.o.-year-old black female patient, well-developed, well-nourished lying in the bed in no acute distress.  Pleasant and cooperative.   HEENT: Head atraumatic, normocephalic. Pupils equal, round, reactive to light and accommodation. No scleral icterus. Extraocular muscles intact. Oropharynx is clear. Mucus membranes moist. NECK: Supple, full range of motion. No JVD, no bruit heard. No cervical lymphadenopathy. CHEST: Decreased breath sounds at the right base.. No use of accessory muscles of respiration.  No reproducible chest wall tenderness.  CARDIOVASCULAR: Irregular S1, S2 normal. No murmurs, rubs, or gallops appreciated. Cap refill <2 seconds. ABDOMEN: Soft, nontender, nondistended. No rebound, guarding, rigidity. Normoactive bowel sounds present  in all four quadrants. No organomegaly or mass. EXTREMITIES: Full range of motion. No pedal edema, cyanosis, or clubbing. NEUROLOGIC: Cranial nerves II through XII are grossly intact with no focal sensorimotor deficit. Muscle strength 5/5 in all extremities. Sensation intact. Gait not checked. PSYCHIATRIC: The patient is alert and oriented x 3. Normal affect, mood, thought content. SKIN: Warm, dry, and intact without obvious rash, lesion, or ulcer.  LABORATORY PANEL:  CBC  Recent Labs Lab 12/15/15 1952  WBC 4.2  HGB 7.1*  HCT 21.4*  PLT 142*    ----------------------------------------------------------------------------------------------------------------- Chemistries  Recent Labs Lab 12/15/15 1952  NA 142  K 3.6  CL 111  CO2 24  GLUCOSE 110*  BUN 11  CREATININE 3.36*  CALCIUM 9.5  AST 27  ALT 20  ALKPHOS 295*  BILITOT 0.4   ------------------------------------------------------------------------------------------------------------------ Cardiac Enzymes No results for input(s): TROPONINI in the last 168 hours. ------------------------------------------------------------------------------------------------------------------  RADIOLOGY: Dg Chest Port 1 View  Result Date: 12/15/2015 CLINICAL DATA:  Cough and right lower rales.  Fever. EXAM: PORTABLE CHEST 1 VIEW COMPARISON:  09/22/2015 FINDINGS: Left subclavian porta catheter with tip at the upper cavoatrial junction. Right-sided dialysis catheter with tips at the SVC. There is no edema, consolidation, effusion, or pneumothorax. Borderline cardiomegaly. Stable aortic contours. IMPRESSION: No evidence of acute disease. Electronically Signed   By: Monte Fantasia M.D.   On: 12/15/2015 20:16    EKG: Sinus tachycardia at 112 bpm with normal axis and nonspecific ST-T wave changes.   IMPRESSION AND PLAN:  This is a 60 y.o. female with a history of  End-stage renal disease on hemodialysis, CHF, DVT of hypertension, breast cancer  now being admitted with: 1. SIRS likely to healthcare associated pneumonia-we'll admit for IV antibiotics including Maxipime and vancomycin, DuoNeb's, Mucinex. Follow-up blood, sputum, urine cultures and flu swab. Possible other sources include port and dialysis catheter. However given symptoms of cough and congestion HCAP favored. We'll repeat chest x-ray in a.m. consult infectious diseases given multiple sources of potential infection. 2. Anemia, chronic. Recheck CBC and transfuse if needed. 3. History of end-stage renal disease on  hemodialysis-consult nephrology. Patient follows Dr. Candiss Norse. Continue Sensipar and Renvela 4. History of peripheral neuropathy-continue Neurontin 5. History of hypertension-continue Cozaar, Lopressor 6. History of GERD-continue Protonix 7. History of depression-continue Zoloft, Klonopin 8. History of pain-continue tramadol 9. 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   Diet/Nutrition: Renal Fluids: Gentle IV normal saline DVT Px: Heparin SCDs and early ambulation Code Status: Full  All the records are reviewed and case discussed with ED provider. Management plans discussed with the patient and/or family who express understanding and agree with plan of care.   TOTAL TIME TAKING CARE OF THIS PATIENT: 60 minutes.   Doron Shake D.O. on 12/15/2015 at 9:23 PM Between 7am to 6pm - Pager - [phone] After 6pm go to [email or web address] - [employer] [facility] [place] Office [phone] CC: Primary care physician; Murlean Iba, MD     Note: This dictation was prepared with Dragon dictation along with smaller phrase technology. Any transcriptional errors that result from this process are unintentional.

## 2015-12-15 NOTE — ED Notes (Signed)
Blood culture draw #1: LEFT AC

## 2015-12-15 NOTE — ED Triage Notes (Addendum)
Pt arrived to ED with c/o of generalized weakness. Pt states she also has chest tightness and possible cold. Pt is currently taking antibiotic that her renal HCP prescribed. Pt went to dialysis today and completed her treatments. Pt also has stage 2 breast cancer, hx of afib and HTN.

## 2015-12-15 NOTE — ED Provider Notes (Signed)
[facility] Emergency Department Provider Note  ____________________________________________   First MD Initiated Contact with Patient 12/15/15 1856     (approximate)  I have reviewed the triage vital signs and the nursing notes.   HISTORY  Chief Complaint Weakness   HPI Teresa Cook is a 60 y.o. female with a history of stage II breast cancer as well as end-stage renal disease on dialysis who is presented to emergency department today with 1 week of fever as well as cough productive of green sputum. She also feels a chest tightness across the front of her chest. Says that she has been taking Augmentin over the past 3 days for these symptoms but her symptoms have been worsening.   Past Medical History:  Diagnosis Date  . Anemia   . Anxiety   . Breast cancer ([place])   . CHF (congestive heart failure) ([place])   . Chronic kidney disease   . Depression   . Dialysis patient ([place])   . DVT (deep venous thrombosis) (HCC)    left leg  . DVT (deep venous thrombosis) ([place])   . Gout   . HTN (hypertension)   . Hypertension   . Parathyroid abnormality ([place])   . Parathyroid disease ([place])   . Psoriasis   . Renal insufficiency   . Sickle cell anemia (HCC)    traits    Patient Active Problem List   Diagnosis Date Noted  . Chest pain 08/31/2015  . SIRS (systemic inflammatory response syndrome) ([place]) 08/27/2015  . Breast cancer of upper-inner quadrant of left female breast ([place]) 08/05/2015  . Pulmonary edema 07/09/2015  . Dizziness 03/03/2015  . Seizures ([place]) 03/03/2015  . Spells 11/11/2014  . Acute respiratory failure ([place]) 09/19/2014  . Left ventricular dysfunction 09/06/2014  . Acute pulmonary edema ([place]) 08/30/2014  . Respiratory failure ([place]) 08/29/2014  . Respiratory difficulty 08/29/2014  . Chronic systolic heart failure ([place]) 05/16/2014  . Hypertension 05/16/2014  . Renal failure 05/16/2014  . Anxiety 05/16/2014  . Sickle cell trait ([place])  03/03/2014  . ESRD (end stage renal disease) on dialysis ([place]) 11/02/2013  . Dysgeusia 01/27/2013  . Weight loss 01/27/2013  . Resting tremor 08/13/2012  . Depression 04/30/2012  . 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 Right leg DVT ([place]) 04/30/2012    Past Surgical History:  Procedure Laterality Date  . ABDOMINAL HYSTERECTOMY    . APPENDECTOMY    . BREAST BIOPSY Left 10/28/2013   benign  . BREAST EXCISIONAL BIOPSY Left 2002   benign  . INSERTION OF DIALYSIS CATHETER  2014  . PARTIAL HYSTERECTOMY    . PORTACATH PLACEMENT Left 08/21/2015   Procedure: INSERTION PORT-A-CATH;  Surgeon: Hubbard Robinson, MD;  Location: [facility] ORS;  Service: General;  Laterality: Left;    Prior to Admission medications   Medication Sig Start Date End Date Taking? Authorizing Provider  clonazePAM (KLONOPIN) 1 MG tablet Take 0.5-1 mg by mouth daily.  08/23/15   Historical Provider, MD  gabapentin (NEURONTIN) 100 MG capsule Take 2 capsules (200 mg total) by mouth 3 (three) times daily as needed. 12/08/15   Hubbard Robinson, MD  lidocaine-prilocaine (EMLA) cream Apply 1 application topically as needed. 08/22/15   [name], MD  losartan (COZAAR) 50 MG tablet Take 100 mg by mouth daily. Takes on non-dialysis days=Tuesday, Thursday, Saturday and Sunday. 09/07/14   Historical Provider, MD  metoprolol (LOPRESSOR) 100 MG tablet Take 50 mg by mouth 2 (two) times daily. Takes on non-dialysis  days=Tuesday, Thursday, Saturday and Sunday.    Historical Provider, MD  ondansetron (ZOFRAN) 8 MG tablet Take 1 tablet (8 mg total) by mouth every 8 (eight) hours as needed for nausea or vomiting. 08/24/15   [name], MD  pantoprazole (PROTONIX) 40 MG tablet Take 1 tablet (40 mg total) by mouth 2 (two) times daily before a meal. 08/31/15   Hillary Bow, MD  prochlorperazine (COMPAZINE) 10 MG tablet  08/29/15   Historical Provider, MD  SENSIPAR 60 MG tablet Take 120 mg by mouth daily.  08/24/15    Historical Provider, MD  sertraline (ZOLOFT) 100 MG tablet Take 100 mg by mouth at bedtime.  03/05/12   Historical Provider, MD  sevelamer carbonate (RENVELA) 800 MG tablet Take 2,400 mg by mouth 3 (three) times daily with meals.     Historical Provider, MD  traMADol (ULTRAM) 50 MG tablet Take 50 mg by mouth every 6 (six) hours as needed.  02/20/15   Historical Provider, MD  warfarin (COUMADIN) 5 MG tablet Take 1 tablet Friday, Saturday and Sundays. Take 1 1/2 tablets the other days of the week 11/03/13   Historical Provider, MD  zolpidem (AMBIEN) 10 MG tablet Take 10 mg by mouth at bedtime as needed for sleep.    Historical Provider, MD    Allergies Adhesive [tape]  Family History  Problem Relation Age of Onset  . Stroke Mother   . CVA Mother   . Hypertension Mother   . Hypertension Father   . Hypertension Sister   . Diabetes Sister   . Cancer Sister 24    Breast  . Stroke Brother   . Hypertension Brother   . Breast cancer Maternal Aunt 70    Social History Social History  Substance Use Topics  . Smoking status: Never Smoker  . Smokeless tobacco: Never Used  . Alcohol use No    Review of Systems Constitutional: No fever/chills Eyes: No visual changes. ENT: No sore throat. Cardiovascular: As above Respiratory: As above Gastrointestinal: No abdominal pain.  No nausea, no vomiting.  No diarrhea.  No constipation. Genitourinary: Negative for dysuria. Musculoskeletal: Negative for back pain. Skin: Negative for rash. Neurological: Negative for headaches, focal weakness or numbness.  10-point ROS otherwise negative.  ____________________________________________   PHYSICAL EXAM:  VITAL SIGNS: ED Triage Vitals  Enc Vitals Group     BP 12/15/15 1912 (!) 184/105     Pulse Rate 12/15/15 1912 (!) 109     Resp 12/15/15 1932 16     Temp 12/15/15 1912 100.3 F (37.9 C)     Temp Source 12/15/15 1912 Oral     SpO2 12/15/15 1912 96 %     Weight 12/15/15 1850 141 lb (64 kg)       Height 12/15/15 1850 5\' 6"  (1.676 m)     Head Circumference --      Peak Flow --      Pain Score 12/15/15 1850 0     Pain Loc --      Pain Edu? --      Excl. in [place]? --     Constitutional: Alert and oriented. Well appearing and in no acute distress. Eyes: Conjunctivae are normal. PERRL. EOMI. Head: Atraumatic. Nose: No congestion/rhinnorhea. Mouth/Throat: Mucous membranes are moist.   Neck: No stridor.   Cardiovascular: Tachycardic with an irregularly irregular rhythm.. Grossly normal heart sounds.   Respiratory: Normal respiratory effort.  No retractions. Right lower field rales. Gastrointestinal: Soft and nontender. No distention.  Musculoskeletal:  No lower extremity tenderness nor edema.  No joint effusions. Neurologic:  Normal speech and language. No gross focal neurologic deficits are appreciated. Skin:  Skin is warm, dry and intact. No rash noted. Right chest wall permacath with dressing CDI. Psychiatric: Mood and affect are normal. Speech and behavior are normal.  ____________________________________________   LABS (all labs ordered are listed, but only abnormal results are displayed)  Labs Reviewed  COMPREHENSIVE METABOLIC PANEL - Abnormal; Notable for the following:       Result Value   Glucose, Bld 110 (*)    Creatinine, Ser 3.36 (*)    Total Protein 6.4 (*)    Albumin 3.1 (*)    Alkaline Phosphatase 295 (*)    GFR calc non Af Amer 14 (*)    GFR calc Af Amer 16 (*)    All other components within normal limits  CBC WITH DIFFERENTIAL/PLATELET - Abnormal; Notable for the following:    RBC 2.49 (*)    Hemoglobin 7.1 (*)    HCT 21.4 (*)    RDW 17.5 (*)    Platelets 142 (*)    Lymphs Abs 0.5 (*)    All other components within normal limits  CULTURE, BLOOD (ROUTINE X 2)  CULTURE, BLOOD (ROUTINE X 2)  URINE CULTURE  LACTIC ACID, PLASMA  LACTIC ACID, PLASMA  URINALYSIS, COMPLETE (UACMP) WITH MICROSCOPIC  INFLUENZA PANEL BY PCR (TYPE A & B, H1N1)    ____________________________________________  EKG  ED ECG REPORT I, Doran Stabler, the attending physician, personally viewed and interpreted this ECG.   Date: 12/15/2015  EKG Time: 1938  Rate: 112  Rhythm: sinus tachycardia. Read by EKG machine as atrial flutter but appears to be a sinus arrhythmia.  Axis: Normal  Intervals:none  ST&T Change: No ST segment elevation or depression. No abnormal T-wave inversion.  ____________________________________________  RADIOLOGY  DG Chest Port 1 View (Final result)  Result time 12/15/15 20:16:58  Final result by Gilford Silvius, MD (12/15/15 20:16:58)           Narrative:   CLINICAL DATA: Cough and right lower rales. Fever.  EXAM: PORTABLE CHEST 1 VIEW  COMPARISON: 09/22/2015  FINDINGS: Left subclavian porta catheter with tip at the upper cavoatrial junction. Right-sided dialysis catheter with tips at the SVC. There is no edema, consolidation, effusion, or pneumothorax. Borderline cardiomegaly. Stable aortic contours.  IMPRESSION: No evidence of acute disease.   Electronically Signed By: Monte Fantasia M.D. On: 12/15/2015 20:16          ____________________________________________   PROCEDURES  Procedure(s) performed:   Procedures  Critical Care performed:   ____________________________________________   INITIAL IMPRESSION / ASSESSMENT AND PLAN / ED COURSE  Pertinent labs & imaging results that were available during my care of the patient were reviewed by me and considered in my medical decision making (see chart for details).    Clinical Course   Sepsis protocol initiated and patient given empiric antibiotics for healthcare acquired pneumonia.    ----------------------------------------- 8:56 PM on 12/15/2015 -----------------------------------------  Patient without any distress this time. Chest x-ray clear. Possibly early pneumonia. Also had a flu swab. Multiple other possible  sources including urine or bacteremia from her multiple lines. Patient will be admitted to the hospital. Has been on 3 days of Augmentin with persistent fever. Signed out to Dr. Ara Kussmaul.  Patient aware of plan for admission willing to comply. ____________________________________________   FINAL CLINICAL IMPRESSION(S) / ED DIAGNOSES  Sepsis.    NEW MEDICATIONS STARTED  DURING THIS VISIT:  New Prescriptions   No medications on file     Note:  This document was prepared using Dragon voice recognition software and may include unintentional dictation errors.    Orbie Pyo, MD 12/15/15 [phone]

## 2015-12-16 ENCOUNTER — Inpatient Hospital Stay: Payer: Medicare Other

## 2015-12-16 LAB — BASIC METABOLIC PANEL
Anion gap: 7 (ref 5–15)
BUN: 13 mg/dL (ref 6–20)
CALCIUM: 9.7 mg/dL (ref 8.9–10.3)
CO2: 24 mmol/L (ref 22–32)
CREATININE: 4.08 mg/dL — AB (ref 0.44–1.00)
Chloride: 112 mmol/L — ABNORMAL HIGH (ref 101–111)
GFR, EST AFRICAN AMERICAN: 13 mL/min — AB (ref >60)
GFR, EST NON AFRICAN AMERICAN: 11 mL/min — AB (ref >60)
Glucose, Bld: 98 mg/dL (ref 65–99)
Potassium: 3.3 mmol/L — ABNORMAL LOW (ref 3.5–5.1)
SODIUM: 143 mmol/L (ref 135–145)

## 2015-12-16 LAB — CBC
HCT: 20.8 % — ABNORMAL LOW (ref 35.0–47.0)
Hemoglobin: 7.1 g/dL — ABNORMAL LOW (ref 12.0–16.0)
MCH: 29.1 pg (ref 26.0–34.0)
MCHC: 34 g/dL (ref 32.0–36.0)
MCV: 85.6 fL (ref 80.0–100.0)
PLATELETS: 128 10*3/uL — AB (ref 150–440)
RBC: 2.43 MIL/uL — AB (ref 3.80–5.20)
RDW: 17.8 % — ABNORMAL HIGH (ref 11.5–14.5)
WBC: 4.2 10*3/uL (ref 3.6–11.0)

## 2015-12-16 LAB — EXPECTORATED SPUTUM ASSESSMENT W GRAM STAIN, RFLX TO RESP C

## 2015-12-16 LAB — EXPECTORATED SPUTUM ASSESSMENT W REFEX TO RESP CULTURE

## 2015-12-16 LAB — MRSA PCR SCREENING: MRSA by PCR: NEGATIVE

## 2015-12-16 LAB — PROTIME-INR
INR: 1.85
Prothrombin Time: 21.6 seconds — ABNORMAL HIGH (ref 11.4–15.2)

## 2015-12-16 MED ORDER — LORAZEPAM 2 MG/ML IJ SOLN
0.2500 mg | Freq: Once | INTRAMUSCULAR | Status: AC
  Administered 2015-12-16: 0.25 mg via INTRAVENOUS
  Filled 2015-12-16: qty 1

## 2015-12-16 MED ORDER — SODIUM CHLORIDE 0.9 % IV SOLN
INTRAVENOUS | Status: DC
  Administered 2015-12-16: 05:00:00 via INTRAVENOUS

## 2015-12-16 MED ORDER — WARFARIN SODIUM 3 MG PO TABS
7.5000 mg | ORAL_TABLET | Freq: Once | ORAL | Status: AC
  Administered 2015-12-16: 7.5 mg via ORAL
  Filled 2015-12-16: qty 3

## 2015-12-16 MED ORDER — MENTHOL 3 MG MT LOZG
1.0000 | LOZENGE | OROMUCOSAL | Status: DC | PRN
  Administered 2015-12-17: 3 mg via ORAL
  Filled 2015-12-16: qty 9

## 2015-12-16 MED ORDER — LIDOCAINE HCL 1 % IJ SOLN
20.0000 mL | Freq: Once | INTRAMUSCULAR | Status: DC
  Filled 2015-12-16: qty 20

## 2015-12-16 MED ORDER — LORAZEPAM 2 MG/ML IJ SOLN
0.2500 mg | Freq: Once | INTRAMUSCULAR | Status: AC
  Administered 2015-12-16: 0.25 mg via INTRAVENOUS

## 2015-12-16 NOTE — Progress Notes (Signed)
ANTICOAGULATION CONSULT NOTE - Initial Consult  Pharmacy Consult for VKA Indication: hx VTE  Allergies  Allergen Reactions  . Adhesive [Tape] Itching    Silk tape is ok to use.    Patient Measurements: Height: 5\' 6"  (167.6 cm) Weight: 137 lb 4.8 oz (62.3 kg) IBW/kg (Calculated) : 59.3 Heparin Dosing Weight:   Vital Signs: Temp: 99.8 F (37.7 C) (12/09 0414) Temp Source: Oral (12/09 0414) BP: 143/80 (12/09 0922) Pulse Rate: 96 (12/09 0922)  Labs:  Recent Labs  12/15/15 1952 12/16/15 0227  HGB 7.1* 7.1*  HCT 21.4* 20.8*  PLT 142* 128*  LABPROT  --  21.6*  INR  --  1.85  CREATININE 3.36* 4.08*    Estimated Creatinine Clearance: 13.6 mL/min (by C-G formula based on SCr of 4.08 mg/dL (H)).   Medical History: Past Medical History:  Diagnosis Date  . Anemia   . Anxiety   . Breast cancer ([place])   . CHF (congestive heart failure) ([place])   . Chronic kidney disease   . Depression   . Dialysis patient ([place])   . DVT (deep venous thrombosis) (H[facility])    left leg  . DVT (deep venous thrombosis) ([place])   . Gout   . HTN (hypertension)   . Hypertension   . Parathyroid abnormality ([place])   . Parathyroid disease ([place])   . Psoriasis   . Renal insufficiency   . Sickle cell anemia (HCC)    traits    Medications:  Infusions:  . sodium chloride 10 mL/hr at 12/16/15 0521    Assessment: 60 yof cc weakness with history of DVT x 2, on VKA OP. Pharmacy consulted to continue dosing. Note - patient has planned procedure with Dr. Azalee Course with permission to hold VKA 5 days before surgery, however, procedure planned for after Christmas. Will continue VKA dosing during this admission. [facility] has been ordered for VTE ppx, will d/c when INR therapeutic  Goal of Therapy:  INR 2-3 Monitor platelets by anticoagulation protocol: Yes   Plan:  Patient received warfarin 7.5mg  early this AM as she missed dose last evening. INR slightly subtherapeutic. Will resume home dose -  Warfarin 5 mg po  every Friday, Saturday, and Sunday; warfarin 7.5 mg po every Monday, Tuesday, Wednesday, and Thursday. Recheck INR with AM labs and d/c SQH when INR > 2.0  Patrycja Mumpower C, Pharm.D., BCPS Clinical Pharmacist 12/16/2015,11:18 AM

## 2015-12-16 NOTE — Progress Notes (Signed)
[facility]  ROUNDING NOTE   Subjective:   Analena Gama admitted to [facility] on 12/15/2015 for Sepsis, due to unspecified organism [facility]) [A41.9]   Blood cultures drawn on hemodialysis yesterday as outpatient 12/9: aerobic bottle: gram positive bacilli   Objective:  Vital signs in last 24 hours:  Temp:  [99.8 F (37.7 C)-100.3 F (37.9 C)] 99.8 F (37.7 C) (12/09 0414) Pulse Rate:  [96-115] 96 (12/09 0922) Resp:  [16-24] 24 (12/09 0922) BP: (139-188)/(80-105) 143/80 (12/09 0922) SpO2:  [92 %-97 %] 92 % (12/09 0922) Weight:  [62.3 kg (137 lb 4.8 oz)-64 kg (141 lb)] 62.3 kg (137 lb 4.8 oz) (12/08 2238)  Weight change:  Filed Weights   12/15/15 1850 12/15/15 2238  Weight: 64 kg (141 lb) 62.3 kg (137 lb 4.8 oz)    Intake/Output: I/O last 3 completed shifts: In: 66.5 [I.V.:16.5; IV Piggyback:50] Out: 100 [Urine:100]   Intake/Output this shift:  No intake/output data recorded.  Physical Exam: General: NAD, laying in bed  Head: Normocephalic, atraumatic. Moist oral mucosal membranes  Eyes: Anicteric, PERRL  Neck: Supple, trachea midline  Lungs:  Clear to auscultation  Heart: Regular rate and rhythm  Abdomen:  Soft, nontender,   Extremities:  1+ peripheral edema.  Neurologic: Nonfocal, moving all four extremities  Skin: No lesions  Access: RIJ permcath    Basic Metabolic Panel:  Recent Labs Lab 12/15/15 1952 12/16/15 0227  NA 142 143  K 3.6 3.3*  CL 111 112*  CO2 24 24  GLUCOSE 110* 98  BUN 11 13  CREATININE 3.36* 4.08*  CALCIUM 9.5 9.7  MG 1.7  --   PHOS 3.3  --     Liver Function Tests:  Recent Labs Lab 12/15/15 1952  AST 27  ALT 20  ALKPHOS 295*  BILITOT 0.4  PROT 6.4*  ALBUMIN 3.1*   No results for input(s): LIPASE, AMYLASE in the last 168 hours. No results for input(s): AMMONIA in the last 168 hours.  CBC:  Recent Labs Lab 12/15/15 1952 12/16/15 0227  WBC 4.2 4.2  NEUTROABS 3.0  --   HGB 7.1* 7.1*  HCT 21.4* 20.8*   MCV 86.1 85.6  PLT 142* 128*    Cardiac Enzymes: No results for input(s): CKTOTAL, CKMB, CKMBINDEX, TROPONINI in the last 168 hours.  BNP: Invalid input(s): POCBNP  CBG: No results for input(s): GLUCAP in the last 168 hours.  Microbiology: Results for orders placed or performed during the hospital encounter of 12/15/15  Blood Culture (routine x 2)     Status: None (Preliminary result)   Collection Time: 12/15/15  7:52 PM  Result Value Ref Range Status   Specimen Description BLOOD LEFT ASSIST CONTROL  Final   Special Requests   Final    BOTTLES DRAWN AEROBIC AND ANAEROBIC 12CCAERO,14CCANA   Culture NO GROWTH < 12 HOURS  Final   Report Status PENDING  Incomplete  Blood Culture (routine x 2)     Status: None (Preliminary result)   Collection Time: 12/15/15  7:53 PM  Result Value Ref Range Status   Specimen Description BLOOD L HAND  Final   Special Requests   Final    BOTTLES DRAWN AEROBIC AND ANAEROBIC AER 11CC, ANA 10CC   Culture NO GROWTH < 12 HOURS  Final   Report Status PENDING  Incomplete  Culture, sputum-assessment     Status: None   Collection Time: 12/15/15 11:31 PM  Result Value Ref Range Status   Specimen Description EXPECTORATED SPUTUM  Final  Special Requests Immunocompromised  Final   Sputum evaluation   Final    Sputum specimen not acceptable for testing.  Please recollect.   [name] AT 6073 12/16/15.PMH   Report Status 12/16/2015 FINAL  Final  MRSA PCR Screening     Status: None   Collection Time: 12/15/15 11:31 PM  Result Value Ref Range Status   MRSA by PCR NEGATIVE NEGATIVE Final    Comment:        The GeneXpert MRSA Assay (FDA approved for NASAL specimens only), is one component of a comprehensive MRSA colonization surveillance program. It is not intended to diagnose MRSA infection nor to guide or monitor treatment for MRSA infections.     Coagulation Studies:  Recent Labs  12/16/15 0227  LABPROT 21.6*  INR 1.85     Urinalysis:  Recent Labs  12/15/15 2042  COLORURINE STRAW*  LABSPEC 1.005  PHURINE 9.0*  GLUCOSEU 50*  HGBUR NEGATIVE  BILIRUBINUR NEGATIVE  KETONESUR NEGATIVE  PROTEINUR 100*  NITRITE NEGATIVE  LEUKOCYTESUR NEGATIVE      Imaging: Dg Chest 1 View  Result Date: 12/16/2015 CLINICAL DATA:  Initial evaluation for possible pneumonia. EXAM: CHEST 1 VIEW COMPARISON:  Prior radiograph from 12/15/2015. FINDINGS: Right-sided pool blooming in hemodialysis catheter in place, stable. Left-sided Port-A-Cath also unchanged. Moderate cardiomegaly. Mediastinal silhouette within normal limits. Prominent atheromatous plaque within the aortic arch. Lungs normally inflated. Mild diffuse vascular congestion without overt pulmonary edema. No new focal infiltrates identified to suggest pneumonia. No pleural effusion. No pneumothorax. Osseous structures are unchanged. IMPRESSION: 1. No new focal infiltrates to suggest pneumonia identified. 2. Stable cardiomegaly with mild diffuse pulmonary vascular congestion without overt pulmonary edema. Electronically Signed   By: Jeannine Boga M.D.   On: 12/16/2015 04:51   Dg Chest Port 1 View  Result Date: 12/15/2015 CLINICAL DATA:  Cough and right lower rales.  Fever. EXAM: PORTABLE CHEST 1 VIEW COMPARISON:  09/22/2015 FINDINGS: Left subclavian porta catheter with tip at the upper cavoatrial junction. Right-sided dialysis catheter with tips at the SVC. There is no edema, consolidation, effusion, or pneumothorax. Borderline cardiomegaly. Stable aortic contours. IMPRESSION: No evidence of acute disease. Electronically Signed   By: Monte Fantasia M.D.   On: 12/15/2015 20:16     Medications:   . sodium chloride 10 mL/hr at 12/16/15 0521   . ceFEPIme  1 g Intravenous Daily  . cinacalcet  120 mg Oral Q breakfast  . clonazePAM  0.5-1 mg Oral Daily  . guaiFENesin  600 mg Oral BID   And  . dextromethorphan  30 mg Oral BID  . heparin subcutaneous  5,000 Units  Subcutaneous Q8H  . losartan  100 mg Oral Once per day on Sun Tue Thu Sat  . metoprolol  50 mg Oral 2 times per day on Sun Tue Thu Sat  . pantoprazole  40 mg Oral BID AC  . sertraline  100 mg Oral QHS  . sevelamer carbonate  2,400 mg Oral TID WC  . sodium chloride flush  3 mL Intravenous Q12H  . [START ON 12/18/2015] vancomycin  750 mg Intravenous Q M,W,F-HD  . warfarin  5 mg Oral Once per day on Sun Fri Sat   And  . [START ON 12/18/2015] warfarin  7.5 mg Oral Once per day on Mon Tue Wed Thu  . Warfarin - Pharmacist Dosing Inpatient   Does not apply q1800   acetaminophen **OR** acetaminophen, bisacodyl, gabapentin, ipratropium-albuterol, lidocaine-prilocaine, magnesium citrate, ondansetron, prochlorperazine, senna-docusate, traMADol, zolpidem  Assessment/  Plan:  Ms. Estela Vinal is a 60 y.o. black female with ESRD on HD MWF, hypertension, breast cancer, gout, history of DVT, psoriasis    [facility] nephrology/MWF/[address]  1. ESRD on HD MWF: hemodialysis treatment yesterday. No acute indication for dialysis.   2. Bacteremia/sepsis: 12/9 gram positive bacilli - consult vascular surgery to have permcath and possibly port a cath removed and sent for cultures. Discussed case with Dr. Lorenso Courier.  - empiric cefepime and vanco  3. Anemia of chronic kidney disease: hemoglobin 7.1 - Avoid Epogen given recent diagnosis of breast cancer.  4. Breast Cancer - consult Oncology  5. Secondary hyperparathyroidism. -  Continue cinacalcet  - sevelamer with meals.   6. Hypertension: blood culture at goal. - Continue losartan and metoprolol.     LOS: [place], Brannen Koppen 12/9/201711:24 AM

## 2015-12-16 NOTE — Progress Notes (Signed)
Pt noted soreness and pain in the throat at 8/10. Pain med given and MD notified of soreness. Order given for Cepacol Lozenges.

## 2015-12-16 NOTE — Procedures (Signed)
Patient on HD via a right IJ permacath Sepsis with positive blood cultures from catheter  Consent obtained.  Right anterior chest wall prepped/draped in the usual sterile fashion. 1% lidocaine used to anesthetize around catheter insertion site.  Gentle traction on catheter with incision of capsule around cuff. Catheter removed. Pressure held at IJ and along tract. Hemostasis ensured. Dry sterile dressing placed.  Cath tip sent to micro. Patient tolerated procedure well.

## 2015-12-16 NOTE — Progress Notes (Signed)
[place] [facility] [place] NAME: Teresa Cook    MR#:  [id number]  DATE OF BIRTH:  1955/01/22  SUBJECTIVE: Seen the patient at bedside, admitted for fever, cough, generalized weakness, diarrhea. Started on IV antibiotics, blood culture report from nephrology office showed aerobic bottles positive for gram-positive cocci.   CHIEF COMPLAINT:   Chief Complaint  Patient presents with  . Weakness    REVIEW OF SYSTEMS:   ROS CONSTITUTIONAL: Fever, fatigue, generalized weakness.Marland Kitchen  EYES: No blurred or double vision.  EARS, NOSE, AND THROAT: No tinnitus or ear pain.  RESPIRATORY: No cough, shortness of breath, wheezing or hemoptysis.  CARDIOVASCULAR: No chest pain, orthopnea, edema.  GASTROINTESTINAL: No nausea, vomiting, diarrhea or abdominal pain.  GENITOURINARY: No dysuria, hematuria.  ENDOCRINE: No polyuria, nocturia,  HEMATOLOGY: No anemia, easy bruising or bleeding SKIN: No rash or lesion. MUSCULOSKELETAL: No joint pain or arthritis.   NEUROLOGIC: No tingling, numbness, weakness.  PSYCHIATRY: No anxiety or depression.   DRUG ALLERGIES:   Allergies  Allergen Reactions  . Adhesive [Tape] Itching    Silk tape is ok to use.    VITALS:  Blood pressure (!) 143/80, pulse 96, temperature 99.8 F (37.7 C), temperature source Oral, resp. rate (!) 24, height 5\' 6"  (1.676 m), weight 62.3 kg (137 lb 4.8 oz), SpO2 92 %.  PHYSICAL EXAMINATION:  GENERAL:  60 y.o.-year-old patient lying in the bed with no acute distress.  Port  in the left anterior chest wall site looks okay but has tenderness. EYES: Pupils equal, round, reactive to light and accommodation. No scleral icterus. Extraocular muscles intact.  HEENT: Head atraumatic, normocephalic. Oropharynx and nasopharynx clear.  NECK:  Supple, no jugular venous distention. No thyroid enlargement, no tenderness.  LUNGS: Normal breath sounds bilaterally, no wheezing, rales,rhonchi or  crepitation. No use of accessory muscles of respiration.  CARDIOVASCULAR: S1, S2 normal. No murmurs, rubs, or gallops.  ABDOMEN: Soft, nontender, nondistended. Bowel sounds present. No organomegaly or mass.  EXTREMITIES: No pedal edema, cyanosis, or clubbing.  NEUROLOGIC: Cranial nerves II through XII are intact. Muscle strength 5/5 in all extremities. Sensation intact. Gait not checked.  PSYCHIATRIC: The patient is alert and oriented x 3.  SKIN: No obvious rash, lesion, or ulcer.    LABORATORY PANEL:   CBC  Recent Labs Lab 12/16/15 0227  WBC 4.2  HGB 7.1*  HCT 20.8*  PLT 128*   ------------------------------------------------------------------------------------------------------------------  Chemistries   Recent Labs Lab 12/15/15 1952 12/16/15 0227  NA 142 143  K 3.6 3.3*  CL 111 112*  CO2 24 24  GLUCOSE 110* 98  BUN 11 13  CREATININE 3.36* 4.08*  CALCIUM 9.5 9.7  MG 1.7  --   AST 27  --   ALT 20  --   ALKPHOS 295*  --   BILITOT 0.4  --    ------------------------------------------------------------------------------------------------------------------  Cardiac Enzymes No results for input(s): TROPONINI in the last 168 hours. ------------------------------------------------------------------------------------------------------------------  RADIOLOGY:  Dg Chest 1 View  Result Date: 12/16/2015 CLINICAL DATA:  Initial evaluation for possible pneumonia. EXAM: CHEST 1 VIEW COMPARISON:  Prior radiograph from 12/15/2015. FINDINGS: Right-sided pool blooming in hemodialysis catheter in place, stable. Left-sided Port-A-Cath also unchanged. Moderate cardiomegaly. Mediastinal silhouette within normal limits. Prominent atheromatous plaque within the aortic arch. Lungs normally inflated. Mild diffuse vascular congestion without overt pulmonary edema. No new focal infiltrates identified to suggest pneumonia. No pleural effusion. No pneumothorax. Osseous structures are unchanged.  IMPRESSION: 1. No new focal  infiltrates to suggest pneumonia identified. 2. Stable cardiomegaly with mild diffuse pulmonary vascular congestion without overt pulmonary edema. Electronically Signed   By: Jeannine Boga M.D.   On: 12/16/2015 04:51   Dg Chest Port 1 View  Result Date: 12/15/2015 CLINICAL DATA:  Cough and right lower rales.  Fever. EXAM: PORTABLE CHEST 1 VIEW COMPARISON:  09/22/2015 FINDINGS: Left subclavian porta catheter with tip at the upper cavoatrial junction. Right-sided dialysis catheter with tips at the SVC. There is no edema, consolidation, effusion, or pneumothorax. Borderline cardiomegaly. Stable aortic contours. IMPRESSION: No evidence of acute disease. Electronically Signed   By: Monte Fantasia M.D.   On: 12/15/2015 20:16    EKG:   Orders placed or performed during the hospital encounter of 12/15/15  . ED EKG  . ED EKG  . EKG 12-Lead  . EKG 12-Lead    ASSESSMENT AND PLAN:  #1 fever, cough, sepsis by blood culture result the from nephrology office.aerobic bottle Showing gram-positive cocci. Source likely seeding from port. Continue vancomycin, Maxipime, ID consult, patient needs removal of the  Port,vascular surgery is consulted. Patient finished chemotherapy for breast cancer, last chemotherapy was 4 months ago. i  #2 anemia chronic: Hemoglobin stable at 7.1 point #3 peripheral neuropathy''continue Neurontin Neurontin #4 history of hypertension  5. GERD #6depression; continue Zoloft #7 chronic pain due to cancer #8 history of DVT on chronic Coumadin.  Hypokalemia replace- 18 ESRD on hemodialysis. All the records are reviewed and case discussed with Care Management/Social Workerr. Management plans discussed with the patient, family and they are in agreement.  CODE STATUS: full  TOTAL TIME TAKING CARE OF THIS PATIENT:83minutes.   POSSIBLE D/C IN 1-2 DAYS, DEPENDING ON CLINICAL CONDITION.   Epifanio Lesches M.D on 12/16/2015 at 12:11 PM  Between  7am to 6pm - Pager - [phone]  After 6pm go to [email or web address] - password [email or web address] [facility]  Office  [phone]  CC: Primary care physician; Murlean Iba, MD   Note: This dictation was prepared with Dragon dictation along with smaller phrase technology. Any transcriptional errors that result from this process are unintentional.

## 2015-12-17 ENCOUNTER — Inpatient Hospital Stay: Payer: Medicare Other

## 2015-12-17 DIAGNOSIS — Z992 Dependence on renal dialysis: Secondary | ICD-10-CM

## 2015-12-17 DIAGNOSIS — A419 Sepsis, unspecified organism: Secondary | ICD-10-CM

## 2015-12-17 DIAGNOSIS — Z9911 Dependence on respirator [ventilator] status: Secondary | ICD-10-CM

## 2015-12-17 DIAGNOSIS — J810 Acute pulmonary edema: Secondary | ICD-10-CM

## 2015-12-17 DIAGNOSIS — Z9221 Personal history of antineoplastic chemotherapy: Secondary | ICD-10-CM

## 2015-12-17 DIAGNOSIS — J9601 Acute respiratory failure with hypoxia: Secondary | ICD-10-CM

## 2015-12-17 DIAGNOSIS — I132 Hypertensive heart and chronic kidney disease with heart failure and with stage 5 chronic kidney disease, or end stage renal disease: Secondary | ICD-10-CM

## 2015-12-17 DIAGNOSIS — Z170 Estrogen receptor positive status [ER+]: Secondary | ICD-10-CM

## 2015-12-17 DIAGNOSIS — I161 Hypertensive emergency: Secondary | ICD-10-CM

## 2015-12-17 DIAGNOSIS — J811 Chronic pulmonary edema: Secondary | ICD-10-CM

## 2015-12-17 DIAGNOSIS — R400 Somnolence: Secondary | ICD-10-CM

## 2015-12-17 DIAGNOSIS — N186 End stage renal disease: Secondary | ICD-10-CM

## 2015-12-17 DIAGNOSIS — D631 Anemia in chronic kidney disease: Secondary | ICD-10-CM

## 2015-12-17 DIAGNOSIS — C50212 Malignant neoplasm of upper-inner quadrant of left female breast: Secondary | ICD-10-CM

## 2015-12-17 LAB — BLOOD GAS, ARTERIAL
Acid-base deficit: 3.7 mmol/L — ABNORMAL HIGH (ref 0.0–2.0)
Bicarbonate: 22.2 mmol/L (ref 20.0–28.0)
FIO2: 1
MECHVT: 500 mL
O2 Saturation: 99.4 %
PEEP: 8 cmH2O
Patient temperature: 37
RATE: 15 resp/min
pCO2 arterial: 43 mmHg (ref 32.0–48.0)
pH, Arterial: 7.32 — ABNORMAL LOW (ref 7.350–7.450)
pO2, Arterial: 165 mmHg — ABNORMAL HIGH (ref 83.0–108.0)

## 2015-12-17 LAB — CBC WITH DIFFERENTIAL/PLATELET
Basophils Absolute: 0.1 10*3/uL (ref 0–0.1)
Basophils Relative: 1 %
EOS ABS: 0 10*3/uL (ref 0–0.7)
Eosinophils Relative: 0 %
HEMATOCRIT: 23.1 % — AB (ref 35.0–47.0)
HEMOGLOBIN: 7.7 g/dL — AB (ref 12.0–16.0)
LYMPHS ABS: 0.8 10*3/uL — AB (ref 1.0–3.6)
Lymphocytes Relative: 8 %
MCH: 28.4 pg (ref 26.0–34.0)
MCHC: 33.3 g/dL (ref 32.0–36.0)
MCV: 85.3 fL (ref 80.0–100.0)
Monocytes Absolute: 0.5 10*3/uL (ref 0.2–0.9)
Monocytes Relative: 5 %
NEUTROS ABS: 8.9 10*3/uL — AB (ref 1.4–6.5)
NEUTROS PCT: 86 %
Platelets: 178 10*3/uL (ref 150–440)
RBC: 2.71 MIL/uL — AB (ref 3.80–5.20)
RDW: 18 % — ABNORMAL HIGH (ref 11.5–14.5)
WBC: 10.3 10*3/uL (ref 3.6–11.0)

## 2015-12-17 LAB — CBC
HCT: 21.7 % — ABNORMAL LOW (ref 35.0–47.0)
Hemoglobin: 7.3 g/dL — ABNORMAL LOW (ref 12.0–16.0)
MCH: 29 pg (ref 26.0–34.0)
MCHC: 33.5 g/dL (ref 32.0–36.0)
MCV: 86.5 fL (ref 80.0–100.0)
PLATELETS: 142 10*3/uL — AB (ref 150–440)
RBC: 2.51 MIL/uL — ABNORMAL LOW (ref 3.80–5.20)
RDW: 17.9 % — AB (ref 11.5–14.5)
WBC: 5 10*3/uL (ref 3.6–11.0)

## 2015-12-17 LAB — LACTIC ACID, PLASMA
Lactic Acid, Venous: 1.7 mmol/L (ref 0.5–1.9)
Lactic Acid, Venous: 1.7 mmol/L (ref 0.5–1.9)

## 2015-12-17 LAB — MRSA PCR SCREENING: MRSA BY PCR: NEGATIVE

## 2015-12-17 LAB — PHOSPHORUS
PHOSPHORUS: 5.1 mg/dL — AB (ref 2.5–4.6)
Phosphorus: 2.9 mg/dL (ref 2.5–4.6)

## 2015-12-17 LAB — URINE CULTURE: Culture: NO GROWTH

## 2015-12-17 LAB — BASIC METABOLIC PANEL
ANION GAP: 9 (ref 5–15)
BUN: 26 mg/dL — ABNORMAL HIGH (ref 6–20)
CHLORIDE: 105 mmol/L (ref 101–111)
CO2: 23 mmol/L (ref 22–32)
CREATININE: 7.1 mg/dL — AB (ref 0.44–1.00)
Calcium: 8 mg/dL — ABNORMAL LOW (ref 8.9–10.3)
GFR calc non Af Amer: 6 mL/min — ABNORMAL LOW (ref >60)
GFR, EST AFRICAN AMERICAN: 6 mL/min — AB (ref >60)
Glucose, Bld: 153 mg/dL — ABNORMAL HIGH (ref 65–99)
POTASSIUM: 4.2 mmol/L (ref 3.5–5.1)
SODIUM: 137 mmol/L (ref 135–145)

## 2015-12-17 LAB — PROCALCITONIN: PROCALCITONIN: 1.58 ng/mL

## 2015-12-17 LAB — GLUCOSE, CAPILLARY
GLUCOSE-CAPILLARY: 110 mg/dL — AB (ref 65–99)
Glucose-Capillary: 105 mg/dL — ABNORMAL HIGH (ref 65–99)
Glucose-Capillary: 106 mg/dL — ABNORMAL HIGH (ref 65–99)

## 2015-12-17 LAB — APTT: aPTT: 35 seconds (ref 24–36)

## 2015-12-17 LAB — PROTIME-INR
INR: 1.78
PROTHROMBIN TIME: 20.9 s — AB (ref 11.4–15.2)

## 2015-12-17 LAB — MAGNESIUM
MAGNESIUM: 1.8 mg/dL (ref 1.7–2.4)
Magnesium: 1.9 mg/dL (ref 1.7–2.4)

## 2015-12-17 LAB — TROPONIN I
Troponin I: 0.06 ng/mL (ref <0.03)
Troponin I: 0.11 ng/mL (ref <0.03)

## 2015-12-17 LAB — HEPARIN LEVEL (UNFRACTIONATED): Heparin Unfractionated: 0.97 IU/mL — ABNORMAL HIGH (ref 0.30–0.70)

## 2015-12-17 MED ORDER — VITAL HIGH PROTEIN PO LIQD
1000.0000 mL | ORAL | Status: DC
  Administered 2015-12-17 (×2)
  Administered 2015-12-17: 1000 mL

## 2015-12-17 MED ORDER — PANTOPRAZOLE SODIUM 40 MG IV SOLR
40.0000 mg | Freq: Two times a day (BID) | INTRAVENOUS | Status: DC
  Administered 2015-12-17 – 2015-12-18 (×3): 40 mg via INTRAVENOUS
  Filled 2015-12-17 (×3): qty 40

## 2015-12-17 MED ORDER — ORAL CARE MOUTH RINSE
15.0000 mL | Freq: Four times a day (QID) | OROMUCOSAL | Status: DC
  Administered 2015-12-17: 15 mL via OROMUCOSAL

## 2015-12-17 MED ORDER — METOPROLOL TARTRATE 5 MG/5ML IV SOLN
10.0000 mg | INTRAVENOUS | Status: DC | PRN
  Administered 2015-12-18: 10 mg via INTRAVENOUS
  Filled 2015-12-17 (×2): qty 10

## 2015-12-17 MED ORDER — CHLORHEXIDINE GLUCONATE 0.12% ORAL RINSE (MEDLINE KIT)
15.0000 mL | Freq: Two times a day (BID) | OROMUCOSAL | Status: DC
  Administered 2015-12-17 – 2015-12-18 (×2): 15 mL via OROMUCOSAL

## 2015-12-17 MED ORDER — SODIUM CHLORIDE 0.9% FLUSH
10.0000 mL | Freq: Two times a day (BID) | INTRAVENOUS | Status: DC
  Administered 2015-12-17: 10 mL
  Administered 2015-12-18: 30 mL
  Administered 2015-12-18 – 2015-12-23 (×8): 10 mL
  Administered 2015-12-23 – 2015-12-24 (×2): 30 mL
  Administered 2015-12-24: 10 mL
  Administered 2015-12-25: 10:00:00 30 mL
  Administered 2015-12-25 – 2015-12-26 (×2): 10 mL
  Administered 2015-12-26: 20 mL
  Administered 2015-12-27: 30 mL

## 2015-12-17 MED ORDER — FENTANYL CITRATE (PF) 100 MCG/2ML IJ SOLN: 50.0000 ug | Freq: Once | INTRAMUSCULAR | Status: AC

## 2015-12-17 MED ORDER — FUROSEMIDE 10 MG/ML IJ SOLN
40.0000 mg | Freq: Once | INTRAMUSCULAR | Status: AC
Start: 2015-12-17 — End: 2015-12-17
  Administered 2015-12-17: 40 mg via INTRAVENOUS
  Filled 2015-12-17: qty 4

## 2015-12-17 MED ORDER — FENTANYL BOLUS VIA INFUSION
50.0000 ug | INTRAVENOUS | Status: DC | PRN
Start: 2015-12-17 — End: 2015-12-18
  Filled 2015-12-17: qty 50

## 2015-12-17 MED ORDER — ALPRAZOLAM 0.5 MG PO TABS
0.5000 mg | ORAL_TABLET | Freq: Once | ORAL | Status: AC
  Administered 2015-12-17: 0.5 mg via ORAL
  Filled 2015-12-17: qty 1

## 2015-12-17 MED ORDER — FENTANYL 2500MCG IN NS 250ML (10MCG/ML) PREMIX INFUSION
25.0000 ug/h | INTRAVENOUS | Status: DC
  Administered 2015-12-17: 50 ug/h via INTRAVENOUS
  Administered 2015-12-18: 250 ug/h via INTRAVENOUS
  Filled 2015-12-17 (×2): qty 250

## 2015-12-17 MED ORDER — FUROSEMIDE 10 MG/ML IJ SOLN
INTRAMUSCULAR | Status: AC
  Filled 2015-12-17: qty 4

## 2015-12-17 MED ORDER — FENTANYL CITRATE (PF) 100 MCG/2ML IJ SOLN
INTRAMUSCULAR | Status: AC
  Administered 2015-12-17: 100 ug
  Filled 2015-12-17: qty 2

## 2015-12-17 MED ORDER — CLONAZEPAM 1 MG PO TABS: 1.0000 mg | ORAL_TABLET | Freq: Every day | ORAL | Status: DC

## 2015-12-17 MED ORDER — ALBUMIN HUMAN 25 % IV SOLN
12.5000 g | Freq: Once | INTRAVENOUS | Status: AC
  Administered 2015-12-17: 12.5 g via INTRAVENOUS
  Filled 2015-12-17: qty 50

## 2015-12-17 MED ORDER — ASPIRIN 81 MG PO CHEW
81.0000 mg | CHEWABLE_TABLET | Freq: Every day | ORAL | Status: DC
  Administered 2015-12-17 – 2015-12-18 (×2): 81 mg via ORAL
  Filled 2015-12-17 (×4): qty 1

## 2015-12-17 MED ORDER — CHLORHEXIDINE GLUCONATE 0.12% ORAL RINSE (MEDLINE KIT)
15.0000 mL | Freq: Two times a day (BID) | OROMUCOSAL | Status: DC
  Administered 2015-12-17: 15 mL via OROMUCOSAL

## 2015-12-17 MED ORDER — HEPARIN BOLUS VIA INFUSION
3000.0000 [IU] | Freq: Once | INTRAVENOUS | Status: AC
  Administered 2015-12-17: 3000 [IU] via INTRAVENOUS
  Filled 2015-12-17: qty 3000

## 2015-12-17 MED ORDER — HEPARIN (PORCINE) IN NACL 100-0.45 UNIT/ML-% IJ SOLN
850.0000 [IU]/h | INTRAMUSCULAR | Status: DC
  Administered 2015-12-17: 1000 [IU]/h via INTRAVENOUS
  Administered 2015-12-18: 850 [IU]/h via INTRAVENOUS
  Filled 2015-12-17 (×2): qty 250

## 2015-12-17 MED ORDER — ORAL CARE MOUTH RINSE
15.0000 mL | OROMUCOSAL | Status: DC
  Administered 2015-12-17 – 2015-12-18 (×4): 15 mL via OROMUCOSAL

## 2015-12-17 MED ORDER — MIDAZOLAM HCL 2 MG/2ML IJ SOLN
2.0000 mg | INTRAMUSCULAR | Status: DC | PRN
  Administered 2015-12-17: 2 mg via INTRAVENOUS
  Filled 2015-12-17: qty 2

## 2015-12-17 MED ORDER — MIDAZOLAM HCL 2 MG/2ML IJ SOLN: 2.0000 mg | INTRAMUSCULAR | Status: DC | PRN

## 2015-12-17 MED ORDER — SODIUM CHLORIDE 0.9% FLUSH
10.0000 mL | INTRAVENOUS | Status: DC | PRN
  Administered 2015-12-20: 10 mL
  Filled 2015-12-17: qty 40

## 2015-12-17 NOTE — Progress Notes (Addendum)
Pt up to BR for BM and had a bout of vomiting with weakness. Pt BP elevated at that time. Pt complaining of anxiety, agitation and being unable to breathe. Pt. Hem at 7.1 last AM. MD notified and CBC and Pt's home med clonazepam ordered.O2 2L placed.

## 2015-12-17 NOTE — Progress Notes (Signed)
Lab called with a critical troponin of 0.06. Dr. Oletta Darter at [facility] contacted. Orders for PO aspirin given. Troponin's are trending at this time. Will continue to monitor.

## 2015-12-17 NOTE — Progress Notes (Signed)
HD COMPLETED  

## 2015-12-17 NOTE — Progress Notes (Signed)
ANTICOAGULATION CONSULT NOTE - Follow Up Consult  Pharmacy Consult for Warfarin Indication: hx of VTE  Allergies  Allergen Reactions  . Adhesive [Tape] Itching    Silk tape is ok to use.    Patient Measurements: Height: 5\' 6"  (167.6 cm) Weight: 137 lb 4.8 oz (62.3 kg) IBW/kg (Calculated) : 59.3 Heparin Dosing Weight:   Vital Signs: Temp: 99.1 F (37.3 C) (12/10 0432) Temp Source: Oral (12/10 0432) BP: 187/109 (12/10 0432) Pulse Rate: 105 (12/10 0432)  Labs:  Recent Labs  12/15/15 1952 12/16/15 0227 12/17/15 0256  HGB 7.1* 7.1* 7.3*  HCT 21.4* 20.8* 21.7*  PLT 142* 128* 142*  LABPROT  --  21.6* 20.9*  INR  --  1.85 1.78  CREATININE 3.36* 4.08*  --     Estimated Creatinine Clearance: 13.6 mL/min (by C-G formula based on SCr of 4.08 mg/dL (H)).   Assessment: yof cc weakness with history of DVT x 2, on VKA OP. Pharmacy consulted to continue dosing. Note - patient has planned procedure with Dr. Azalee Course with permission to hold VKA 5 days before surgery, however, procedure planned for after Christmas. Will continue VKA dosing during this admission. [facility] has been ordered for VTE ppx, will d/c when INR therapeutic  Goal of Therapy:  INR 2-3 Monitor platelets by anticoagulation protocol: Yes   Plan:  INR still slightly subtherapeutic. Will continue home dose -  Warfarin 5 mg po every Friday, Saturday, and Sunday; warfarin 7.5 mg po every Monday, Tuesday, Wednesday, and Thursday. Recheck INR with AM labs and d/c SQH when INR > 2.0  Paulina Fusi, PharmD, BCPS 12/17/2015 5:38 AM

## 2015-12-17 NOTE — Progress Notes (Signed)
Chaplain received a page to respond to room 205. Provided the ministry of prayer and emotional support.    12/17/15 1000  Clinical Encounter Type  Visited With Patient  Visit Type Initial;Spiritual support  Referral From Nurse  Consult/Referral To Chaplain  Spiritual Encounters  Spiritual Needs Prayer

## 2015-12-17 NOTE — Progress Notes (Signed)
Patient sitting up on side of bed, complaining of nausea this am.  She was talking to the chaplin at the time.  Oral Zofran given per [facility] and patient tolerated without difficulty.  Patient wanted to wait on other medications until she received her breakfast tray.  Patient called my ascom stating that she was having difficulty breathing.  Went to room immediately and found patient  in distress. Oxygen sat on nasal cannula was in 90's but started to drop. Rapid response called.  Dr. Vianne Bulls was present.  40mg  of lasix give IV per MD order.  It was decided, based on her distress, that she would be transferred to ICU where she was transported via bed to bed 8.  Dr. Juleen China was also present.   Lauris Poag, RN 12/17/15 1048

## 2015-12-17 NOTE — Care Management Note (Signed)
Case Management Note  Patient Details  Name: Teresa Cook MRN: [id number] Date of Birth: 04/28/1955  Subjective/Objective:        Chronic ESRD and receives hemodialysis at [facility] M_W_F at [facility] location. Completed chemo for breast cancer October 2017.            Action/Plan:   Expected Discharge Date:                  Expected Discharge Plan:     In-House Referral:     Discharge planning Services     Post Acute Care Choice:    Choice offered to:     DME Arranged:    DME Agency:     HH Arranged:    HH Agency:     Status of Service:     If discussed at [employer] of Stay Meetings, dates discussed:    Additional Comments:  Dejana Pugsley A, RN 12/17/2015, 1:38 PM

## 2015-12-17 NOTE — Consult Note (Signed)
[name]  [phone]) [phone] [phone]) [phone]  ID: Teresa Cook OB: 1955/07/31  MR#: [id number]  AOZ#:[id number]  [name], [name], MD (Family Medicine)  CHIEF COMPLAINT: Patient admitted for sepsis now intubated and sedated secondary to flash pulmonary edema. Patient has breast cancer and recently completed chemotherapy on October 26, 2015.  INTERVAL HISTORY: Patient is a 60 year old female with a history of breast cancer who completed neoadjuvant chemotherapy with Taxotere and Cytoxan on October 26, 2015. She has been evaluated by surgery and elected to postpone surgical intervention until after Christmas. Patient was admitted with sepsis and is now intubated and sedated secondary to flash pulmonary edema. No family is at bedside. Review of systems is unobtainable.   REVIEW OF SYSTEMS:   Review of Systems  Unable to perform ROS: Intubated    PAST MEDICAL HISTORY: Past Medical History:  Diagnosis Date  . Anemia   . Anxiety   . Breast cancer ([place])   . CHF (congestive heart failure) ([place])   . Chronic kidney disease   . Depression   . Dialysis patient ([place])   . DVT (deep venous thrombosis) ([facility])    left leg  . DVT (deep venous thrombosis) ([place])   . Gout   . HTN (hypertension)   . Hypertension   . Parathyroid abnormality ([facility])   . Parathyroid disease ([place])   . Psoriasis   . Renal insufficiency   . Sickle cell anemia ([facility])    traits    PAST SURGICAL HISTORY: Past Surgical History:  Procedure Laterality Date  . ABDOMINAL HYSTERECTOMY    . APPENDECTOMY    . BREAST BIOPSY Left 10/28/2013   benign  . BREAST EXCISIONAL BIOPSY Left 2002   benign  . INSERTION OF DIALYSIS CATHETER  2014  . PARTIAL HYSTERECTOMY    . PORTACATH PLACEMENT Left 08/21/2015   Procedure: INSERTION PORT-A-CATH;  Surgeon: Hubbard Robinson, MD;  Location: [facility] ORS;  Service: General;  Laterality:  Left;    FAMILY HISTORY: Family History  Problem Relation Age of Onset  . Stroke Mother   . CVA Mother   . Hypertension Mother   . Hypertension Father   . Hypertension Sister   . Diabetes Sister   . Cancer Sister 42    Breast  . Stroke Brother   . Hypertension Brother   . Breast cancer Maternal Aunt 80    ADVANCED DIRECTIVES (Y/N):  '@ADVDIR' @  HEALTH MAINTENANCE: Social History  Substance Use Topics  . Smoking status: Never Smoker  . Smokeless tobacco: Never Used  . Alcohol use No     Colonoscopy:  PAP:  Bone density:  Lipid panel:  Allergies  Allergen Reactions  . Adhesive [Tape] Itching    Silk tape is ok to use.    Current Facility-Administered Medications  Medication Dose Route Frequency Provider Last Rate Last Dose  . acetaminophen (TYLENOL) tablet 650 mg  650 mg Oral Q6H PRN Alexis Hugelmeyer, DO   650 mg at 12/16/15 1340   Or  . acetaminophen (TYLENOL) suppository 650 mg  650 mg Rectal Q6H PRN Alexis Hugelmeyer, DO      . bisacodyl (DULCOLAX) EC tablet 5 mg  5 mg Oral Daily PRN Alexis Hugelmeyer, DO      . ceFEPIme (MAXIPIME) 1 GM / 41m IVPB premix  1 g Intravenous Daily Alexis Hugelmeyer, DO   1 g at 12/16/15 1717  . chlorhexidine gluconate (MEDLINE KIT) (PERIDEX) 0.12 %  solution 15 mL  15 mL Mouth Rinse BID Vishal Mungal, MD      . fentaNYL (SUBLIMAZE) 100 MCG/2ML injection           . fentaNYL (SUBLIMAZE) bolus via infusion 50 mcg  50 mcg Intravenous Q1H PRN Vishal Mungal, MD      . fentaNYL (SUBLIMAZE) injection 50 mcg  50 mcg Intravenous Once Vishal Mungal, MD      . fentaNYL 2524mg in NS 2550m(1046mml) infusion-PREMIX  25-400 mcg/hr Intravenous Continuous Vishal Mungal, MD      . furosemide (LASIX) 10 MG/ML injection           . furosemide (LASIX) injection 40 mg  40 mg Intravenous Once SneEpifanio LeschesD      . heparin ADULT infusion 100 units/mL (25000 units/250m8mdium chloride 0.45%)  1,000 Units/hr Intravenous Continuous SnehEpifanio Lesches      . heparin bolus via infusion 3,000 Units  3,000 Units Intravenous Once SnehEpifanio Lesches      . ipratropium-albuterol (DUONEB) 0.5-2.5 (3) MG/3ML nebulizer solution 3 mL  3 mL Nebulization Q6H PRN Alexis Hugelmeyer, DO   3 mL at 12/17/15 0455  . lidocaine (XYLOCAINE) 1 % (with pres) injection 20 mL  20 mL Intradermal Once SaraLavonia Dana      . losartan (COZAAR) tablet 100 mg  100 mg Oral Once per day on Sun Tue Thu Sat [facility]   100 mg at 12/16/15 [id number] mouth rinse  15 mL Mouth Rinse QID Vishal Mungal, MD      . metoprolol (LOPRESSOR) injection 10 mg  10 mg Intravenous Q4H PRN Vishal Mungal, MD      . metoprolol (LOPRESSOR) tablet 50 mg  50 mg Oral 2 times per day on Sun Tue Thu Sat [facility]   50 mg at 12/16/15 2113  . midazolam (VERSED) injection 2 mg  2 mg Intravenous Q15 min PRN Vishal Mungal, MD      . midazolam (VERSED) injection 2 mg  2 mg Intravenous Q2H PRN Vishal Mungal, MD      . pantoprazole (PROTONIX) injection 40 mg  40 mg Intravenous Q12H Vishal Mungal, MD      . senna-docusate (Senokot-S) tablet 1 tablet  1 tablet Oral QHS PRN Alexis Hugelmeyer, DO      . sertraline (ZOLOFT) tablet 100 mg  100 mg Oral QHS Alexis Hugelmeyer, DO   100 mg at 12/16/15 2114  . sodium chloride flush (NS) 0.9 % injection 3 mL  3 mL Intravenous Q12H Alexis Hugelmeyer, DO   3 mL at 12/16/15 2114  . [START ON 12/18/2015] vancomycin (VANCOCIN) IVPB 750 mg/150 ml premix  750 mg Intravenous Q M,W,F-HD Alexis Hugelmeyer, DO        OBJECTIVE: Vitals:   12/17/15 1020 12/17/15 1028  BP: (!) 163/145 (!) 194/128  Pulse: 88   Resp: (!) 24   Temp:       Body mass index is 22.16 kg/m.    ECOG FS:4 - Bedbound  General: Ill-appearing Eyes: Pink conjunctiva, anicteric sclera. HEENT: ET tube in place. Lungs: Mechanical breath sounds Heart: Regular rate and rhythm. No rubs, murmurs, or gallops. Abdomen: Soft, nontender, nondistended. No organomegaly  noted, normoactive bowel sounds. Musculoskeletal: No edema, cyanosis, or clubbing. Neuro: Sedated.  LAB RESULTS:  Lab Results  Component Value Date   NA 143 12/16/2015   K 3.3 (L) 12/16/2015   CL 112 (H) 12/16/2015   CO2 24 12/16/2015  GLUCOSE 98 12/16/2015   BUN 13 12/16/2015   CREATININE 4.08 (H) 12/16/2015   CALCIUM 9.7 12/16/2015   PROT 6.4 (L) 12/15/2015   ALBUMIN 3.1 (L) 12/15/2015   AST 27 12/15/2015   ALT 20 12/15/2015   ALKPHOS 295 (H) 12/15/2015   BILITOT 0.4 12/15/2015   GFRNONAA 11 (L) 12/16/2015   GFRAA 13 (L) 12/16/2015    Lab Results  Component Value Date   WBC 5.0 12/17/2015   NEUTROABS 3.0 12/15/2015   HGB 7.3 (L) 12/17/2015   HCT 21.7 (L) 12/17/2015   MCV 86.5 12/17/2015   PLT 142 (L) 12/17/2015     STUDIES: Dg Chest 1 View  Result Date: 12/17/2015 CLINICAL DATA:  Respiratory failure, intubation EXAM: CHEST 1 VIEW COMPARISON:  Portable exam 1058 hours compared to 12/16/2015 FINDINGS: Tip of endotracheal tube projects 4.3 cm above carina. Nasogastric tube extends into stomach. RIGHT subclavian Port-A-Cath with tip projecting over SVC. Borderline enlargement of cardiac silhouette. Atherosclerotic calcification aorta. Patchy BILATERAL pulmonary infiltrates with perihilar and upper lobe predominance, question multifocal pneumonia. No pleural effusion or pneumothorax. Bones demineralized. IMPRESSION: New BILATERAL perihilar and upper lobe infiltrates greatest in RIGHT upper lobe question multifocal pneumonia, asymmetric edema less likely. Electronically Signed   By: Lavonia Dana M.D.   On: 12/17/2015 11:33   Dg Chest 1 View  Result Date: 12/16/2015 CLINICAL DATA:  Initial evaluation for possible pneumonia. EXAM: CHEST 1 VIEW COMPARISON:  Prior radiograph from 12/15/2015. FINDINGS: Right-sided pool blooming in hemodialysis catheter in place, stable. Left-sided Port-A-Cath also unchanged. Moderate cardiomegaly. Mediastinal silhouette within normal limits.  Prominent atheromatous plaque within the aortic arch. Lungs normally inflated. Mild diffuse vascular congestion without overt pulmonary edema. No new focal infiltrates identified to suggest pneumonia. No pleural effusion. No pneumothorax. Osseous structures are unchanged. IMPRESSION: 1. No new focal infiltrates to suggest pneumonia identified. 2. Stable cardiomegaly with mild diffuse pulmonary vascular congestion without overt pulmonary edema. Electronically Signed   By: Jeannine Boga M.D.   On: 12/16/2015 04:51   Dg Abd 1 View  Result Date: 12/17/2015 CLINICAL DATA:  Orogastric tube placement EXAM: ABDOMEN - 1 VIEW COMPARISON:  Portable exam 1102 hours without priors for comparison FINDINGS: Orogastric tube extends into mid stomach. Linear subsegmental atelectasis medial LEFT lower lobe. RIGHT lung base clear. Visualized bowel gas pattern normal. IMPRESSION: Tip of orogastric tube projects over mid stomach. Electronically Signed   By: Lavonia Dana M.D.   On: 12/17/2015 11:34   Dg Chest Port 1 View  Result Date: 12/15/2015 CLINICAL DATA:  Cough and right lower rales.  Fever. EXAM: PORTABLE CHEST 1 VIEW COMPARISON:  09/22/2015 FINDINGS: Left subclavian porta catheter with tip at the upper cavoatrial junction. Right-sided dialysis catheter with tips at the SVC. There is no edema, consolidation, effusion, or pneumothorax. Borderline cardiomegaly. Stable aortic contours. IMPRESSION: No evidence of acute disease. Electronically Signed   By: Monte Fantasia M.D.   On: 12/15/2015 20:16   Mm Clip Placement Right  Result Date: 12/06/2015 CLINICAL DATA:  Status post MR guided core biopsy of the right breast. EXAM: DIAGNOSTIC RIGHT MAMMOGRAM POST MRI BIOPSY COMPARISON:  Previous exam(s). FINDINGS: Mammographic images were obtained following MR guided biopsy of the right breast. Mammographic images show there is a dumbbell-shaped clip in the 6 o'clock region of the right breast. IMPRESSION: Status post MR  guided core biopsy of the right breast with pathology pending. Final Assessment: Post Procedure Mammograms for Marker Placement Electronically Signed   By: Georgiana Shore.D.  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 This was found to be concordant by Dr. Everlean Alstrom. Pathology results were discussed with the patient by telephone. The patient reported doing well after the biopsy with tenderness at the site. Post biopsy instructions and care were reviewed and questions were answered. The patient was encouraged to call [facility] for any additional concerns. The patient has a recent diagnosis of left breast cancer and should follow her outlined treatment plan. Pathology results reported by Terie Purser, RN on 12/08/2015. Electronically Signed   By: Lillia Mountain M.D.   On: 12/08/2015 14:18   Result Date: 12/11/2015 CLINICAL DATA:  Biopsy proven left breast cancer. Recent MRI showed abnormal enhancement in the right breast. EXAM: MRI GUIDED CORE NEEDLE BIOPSY OF THE RIGHT BREAST TECHNIQUE: Multiplanar, multisequence MR imaging of the right breast was performed both before and after administration of intravenous contrast. CONTRAST:  48m MULTIHANCE GADOBENATE DIMEGLUMINE 529 MG/ML IV SOLN COMPARISON:  Previous exams. FINDINGS: I met with the patient, and we discussed the procedure of MRI guided biopsy, including risks, benefits, and alternatives. Specifically, we discussed the risks of infection, bleeding, tissue injury, clip migration, and inadequate sampling. Informed, written consent was given. The usual time out protocol was performed immediately prior to the procedure. Using sterile technique, 1% Lidocaine, MRI guidance,  and a 9 gauge vacuum assisted device, biopsy was performed of the right breast using a lateral to medial approach. At the conclusion of the procedure, a dumbbell-shaped tissue marker clip was deployed into the biopsy cavity. Follow-up 2-view mammogram was performed and dictated separately. IMPRESSION: MRI guided biopsy of the right breast. No apparent complications. Electronically Signed: By: DLillia MountainM.D. On: 12/06/2015 09:47    ASSESSMENT: Patient admitted for sepsis now intubated and sedated secondary to flash pulmonary edema. Patient has breast cancer and recently completed chemotherapy on October 26, 2015.  PLAN:    1. 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 She was recently seen surgery and elected to postpone surgical intervention until after Christmas. Patient had a port placement for her chemotherapy, but if necessary this can be removed in the setting of underlying sepsis.  2. End-stage renal disease: Continue dialysis on Monday, Wednesday, and Fridays. 3. Flash pulmonary edema: Patient intubated and sedated. Case discussed with pulmonary. 4. Anemia: Chronic. Okay to transfuse if hemoglobin falls below 7.0.   Appreciate consult, will follow.   TLloyd Huger MD   12/17/2015 12:12 PM

## 2015-12-17 NOTE — Procedures (Signed)
  Procedure Note: LEFT Femoral Central Venous Catheter Placement  [name] , [id number] , IC08A/IC08A-AA  Indications: Hemodynamic monitoring / Intravenous access  Emergent placement A time-out was completed verifying correct patient, procedure and site.  A 3 lumen catheter available at the time of procedure.  The patient was placed in a dependent position appropriate for central line placement based on the vein to be cannulated.   The patient's LEFT femoral Vein was prepped and draped in a sterile fashion.  1% Lidocaine WAS NOT used to anesthetize the surrounding skin area.   A 3 lumen catheter was introduced into the LEFT Femoral Vein using Seldinger technique, visualized under ultrasound.  The catheter was threaded smoothly over the guide wire and appropriate blood return was obtained.  Each lumen of the catheter was evacuated of air and flushed with sterile saline.  The catheter was then sutured in place to the skin and a sterile dressing applied.  Perfusion to the extremity distal to the point of catheter insertion was checked and found to be adequate.     The patient tolerated the procedure well and there were no complications.  Vilinda Boehringer, MD [facility] Pager [phone] (please enter 7-digits) On Call Pager - [phone] (please enter 7-digits)

## 2015-12-17 NOTE — Significant Event (Signed)
Rapid Response Event Note  Overview: Time Called: 1023 Event Type: Respiratory  Initial Focused Assessment: Pt laying in bed in obvious resp distress.  Course crackles noted in all lung fields.  Audible from bedside.  Unable to obtain reliable Oxygen saturation in patients room.  Pt hypertensive 190's/160's.  Pulse 140's.  Supervisory Engineering geologist at bedside.  Decision made to transfer patient to ICU for what appears to be Flash pulmonary edema.  Dr. Stevenson Clinch CCM  notified.    Interventions: Transferred to ICU.  RSI conducted by Dr. Stevenson Clinch.  Pt placed on Vent. Being prepped for emergent dialysis.      Plan of Care (if not transferred):   Event Summary: Name of Physician Notified: Konidena at 39  Name of Consulting Physician Notified: Mungal at 1028  Outcome: Transferred (Comment)  Event End Time: [place]

## 2015-12-17 NOTE — Progress Notes (Signed)
Rapid Response initiated at 1020 after patient called for difficulty breathing, vs obtained and patient assessed.

## 2015-12-17 NOTE — Progress Notes (Signed)
Initial Nutrition Assessment  DOCUMENTATION CODES:   Not applicable  INTERVENTION:  -Discussed nutritional poc with MD Mungal and received verbal order to start TF. Recommend Vital High Protein at 55 ml/hr providing 1320 kcals, 116 g protein and 1109 mL of free water. PEPuP initiated. Continue to assess  NUTRITION DIAGNOSIS:   Inadequate oral intake related to acute illness as evidenced by NPO status.  GOAL:   Patient will meet greater than or equal to 90% of their needs  MONITOR:   Vent status, Labs, Weight trends, TF tolerance  REASON FOR ASSESSMENT:   Malnutrition Screening Tool    ASSESSMENT:   60 yo female admitted 12/08 with sepsis with positive blood cultures from dialysis catheter ; s/p rapid response this AM (12/10) with respiratory distress due to flash pulmonary edema, transferred to the unit and intubated. Pt with hx of ESRD on HD, CHF, DVT, HTN breast cancer (finished chemo in 10/2015 with surgery planned for 01/2016)  Right IJ permcath removed yesterday, temp catheter placed this AM, Plan for dialysis today per MD note  Patient is currently intubated on ventilator support MV: 7.3 L/min Temp (24hrs), Avg:99.1 F (37.3 C), Min:98.7 F (37.1 C), Max:99.4 F (37.4 C)  OG tube in stomach, bowel gas pattern normal per xray  Labs: potassium 3.3, FSBS 105, Creatinine 4.08, BUN wdl Meds: fentanyl/versed for sedation  Diet Order:  Diet NPO time specified  Skin:  Reviewed, no issues  Last BM:  12/9  Height:   Ht Readings from Last 1 Encounters:  12/15/15 5\' 6"  (1.676 m)    Weight:   Wt Readings from Last 1 Encounters:  12/15/15 137 lb 4.8 oz (62.3 kg)    Filed Weights   12/15/15 1850 12/15/15 2238  Weight: 141 lb (64 kg) 137 lb 4.8 oz (62.3 kg)    BMI:  Body mass index is 22.16 kg/m.  Estimated Nutritional Needs:   Kcal:  1417 kcals  Protein:  93-124 g  Fluid:  1000 mL plus UOP  EDUCATION NEEDS:   No education needs identified at this  time  [place], [place], [place] [phone] Pager  [phone] Weekend/On-Call Pager

## 2015-12-17 NOTE — Progress Notes (Signed)
ANTICOAGULATION CONSULT NOTE - Follow Up Consult  Pharmacy Consult for Heparin Drip Management  Indication: hx of VTE  Allergies  Allergen Reactions  . Adhesive [Tape] Itching    Silk tape is ok to use.    Patient Measurements: Height: 5\' 6"  (167.6 cm) Weight: 137 lb 4.8 oz (62.3 kg) IBW/kg (Calculated) : 59.3 Heparin Dosing Weight:   Vital Signs: Temp: 99 F (37.2 C) (12/10 1300) Temp Source: Axillary (12/10 1300) BP: 154/88 (12/10 1300) Pulse Rate: 110 (12/10 1300)  Labs:  Recent Labs  12/15/15 1952 12/16/15 0227 12/17/15 0256 12/17/15 1300  HGB 7.1* 7.1* 7.3* 7.7*  HCT 21.4* 20.8* 21.7* 23.1*  PLT 142* 128* 142* 178  APTT  --   --   --  35  LABPROT  --  21.6* 20.9*  --   INR  --  1.85 1.78  --   CREATININE 3.36* 4.08*  --  7.10*  TROPONINI  --   --   --  0.06*    Estimated Creatinine Clearance: 7.8 mL/min (by C-G formula based on SCr of 7.1 mg/dL (H)).   Assessment:  Pharmacy consulted for heparin drip management for 60 yo female transferred to ICU due to flash pulmonary edema and requiring emergent intubation. Pharmacy previously consulted for warfarin management; patient is currently subtherapeutic. Patient has history significant for DVT x 2 and required warfarin as an outpatient.  Goal of Therapy:  Anti-Xa 0.3-0.7  Monitor platelets by anticoagulation protocol: Yes   Plan:  Patient bolused 3000 units x1 and started on heparin drip 1000 units/hr for goal anti-Xa level 0.3-0.7. Will obtain q8hr anti-Xa levels as patient is on dialysis. Will obtain initial anti-Xa at 2100.   Pharmacy will continue to monitor and adjust per consult.    MLS 12/17/2015 3:00 PM

## 2015-12-17 NOTE — Progress Notes (Signed)
HD STARTED  

## 2015-12-17 NOTE — Progress Notes (Signed)
PRE DIALYSIS ASSESSMENT 

## 2015-12-17 NOTE — Progress Notes (Signed)
POST DIALYSIS ASSESSMENT 

## 2015-12-17 NOTE — Progress Notes (Signed)
eLink Physician-Brief Progress Note Patient Name: Teresa Cook DOB: 10-Aug-1955 MRN: [id number]   Date of Service  12/17/2015  HPI/Events of Note  Troponin = 0.06. EKG with NSR rate = 88. Non specific ST-T changes, prolonged QT interval. Demand ischemia?  eICU Interventions  Will order: 1. Continue to trend troponin. 2. ASA 81 mg via NGT now and Q day.      Intervention Category Intermediate Interventions: Diagnostic test evaluation  Sommer,Steven Eugene 12/17/2015, 4:06 PM

## 2015-12-17 NOTE — Progress Notes (Signed)
Pt arrived on unit as a rapid response at approximately 1100. Pt emergently intubated, central line placed and trialysis catheter placed. Full assessment not completed until after emergency interventions were completed.

## 2015-12-17 NOTE — Progress Notes (Signed)
Rapid Response called to room 205. Upon arrival to room patient in bed. Respiratory Therapist already at bedside. Teresa Cook, Morgan from ICU present and assessing situation. Patient wearing 02 @ 100% NRB in obvious acute respiratory distress. HOB elevated. Patient with increased WOB. Decision made to transfer patient to  ICU immediately with plans to intubate. Teresa Cook on phone with Dr. Stevenson Clinch. This nurse assisted with transfer to ICU 8.

## 2015-12-17 NOTE — Progress Notes (Signed)
[facility]  ROUNDING NOTE   Subjective:   Permcath removed yesterday. Cath tip sent. No growth  Patient went into respiratory distress this morning and was transferred to ICU and intubated for mechanical ventilation.   CXR with new right upper and middle lobe infiltrates.   Left temp HD catheter placed by Dr. Stevenson Clinch.   Objective:  Vital signs in last 24 hours:  Temp:  [98.7 F (37.1 C)-99.4 F (37.4 C)] 99.1 F (37.3 C) (12/10 0432) Pulse Rate:  [84-105] 88 (12/10 1020) Resp:  [16-24] 24 (12/10 1020) BP: (149-194)/(81-145) 194/128 (12/10 1028) SpO2:  [81 %-97 %] 97 % (12/10 1045) FiO2 (%):  [100 %] 100 % (12/10 1045)  Weight change:  Filed Weights   12/15/15 1850 12/15/15 2238  Weight: 64 kg (141 lb) 62.3 kg (137 lb 4.8 oz)    Intake/Output: I/O last 3 completed shifts: In: 596.5 [P.O.:480; I.V.:16.5; IV Piggyback:100] Out: 100 [Urine:100]   Intake/Output this shift:  No intake/output data recorded.  Physical Exam: General: Critically ill  Head: ETT  Eyes: Eyes closed  Neck:    Lungs:  Bilateral crackles, PRVC FiO2 100%  Heart: Regular rate and rhythm  Abdomen:  Soft, nontender  Extremities: no peripheral edema.  Neurologic: Intubated and sedated  Skin: No lesions  Access:  left temp HD catheter 12/10 Dr. Stevenson Clinch    Basic Metabolic Panel:  Recent Labs Lab 12/15/15 1952 12/16/15 0227  NA 142 143  K 3.6 3.3*  CL 111 112*  CO2 24 24  GLUCOSE 110* 98  BUN 11 13  CREATININE 3.36* 4.08*  CALCIUM 9.5 9.7  MG 1.7  --   PHOS 3.3  --     Liver Function Tests:  Recent Labs Lab 12/15/15 1952  AST 27  ALT 20  ALKPHOS 295*  BILITOT 0.4  PROT 6.4*  ALBUMIN 3.1*   No results for input(s): LIPASE, AMYLASE in the last 168 hours. No results for input(s): AMMONIA in the last 168 hours.  CBC:  Recent Labs Lab 12/15/15 1952 12/16/15 0227 12/17/15 0256  WBC 4.2 4.2 5.0  NEUTROABS 3.0  --   --   HGB 7.1* 7.1* 7.3*  HCT 21.4* 20.8*  21.7*  MCV 86.1 85.6 86.5  PLT 142* 128* 142*    Cardiac Enzymes: No results for input(s): CKTOTAL, CKMB, CKMBINDEX, TROPONINI in the last 168 hours.  BNP: Invalid input(s): POCBNP  CBG:  Recent Labs Lab 12/17/15 0223  GLUCAP 105*    Microbiology: Results for orders placed or performed during the hospital encounter of 12/15/15  Blood Culture (routine x 2)     Status: None (Preliminary result)   Collection Time: 12/15/15  7:52 PM  Result Value Ref Range Status   Specimen Description BLOOD LEFT ASSIST CONTROL  Final   Special Requests   Final    BOTTLES DRAWN AEROBIC AND ANAEROBIC 12CCAERO,14CCANA   Culture NO GROWTH 2 DAYS  Final   Report Status PENDING  Incomplete  Blood Culture (routine x 2)     Status: None (Preliminary result)   Collection Time: 12/15/15  7:53 PM  Result Value Ref Range Status   Specimen Description BLOOD L HAND  Final   Special Requests   Final    BOTTLES DRAWN AEROBIC AND ANAEROBIC AER 11CC, ANA 10CC   Culture NO GROWTH 2 DAYS  Final   Report Status PENDING  Incomplete  Urine culture     Status: None   Collection Time: 12/15/15  8:42 PM  Result Value Ref Range Status   Specimen Description URINE, RANDOM  Final   Special Requests NONE  Final   Culture NO GROWTH [facility]   Final   Report Status 12/17/2015 FINAL  Final  Culture, sputum-assessment     Status: None   Collection Time: 12/15/15 11:31 PM  Result Value Ref Range Status   Specimen Description EXPECTORATED SPUTUM  Final   Special Requests Immunocompromised  Final   Sputum evaluation   Final    Sputum specimen not acceptable for testing.  Please recollect.   [name] AT 3149 12/16/15.PMH   Report Status 12/16/2015 FINAL  Final  MRSA PCR Screening     Status: None   Collection Time: 12/15/15 11:31 PM  Result Value Ref Range Status   MRSA by PCR NEGATIVE NEGATIVE Final    Comment:        The GeneXpert MRSA Assay (FDA approved for NASAL  specimens only), is one component of a comprehensive MRSA colonization surveillance program. It is not intended to diagnose MRSA infection nor to guide or monitor treatment for MRSA infections.   Cath Tip Culture     Status: None (Preliminary result)   Collection Time: 12/16/15  3:30 PM  Result Value Ref Range Status   Specimen Description HEMODIALYSIS CATHETER  Final   Special Requests NONE  Final   Culture   Final    NO GROWTH < 24 HOURS Performed at [facility]    Report Status PENDING  Incomplete    Coagulation Studies:  Recent Labs  12/16/15 0227 12/17/15 0256  LABPROT 21.6* 20.9*  INR 1.85 1.78    Urinalysis:  Recent Labs  12/15/15 2042  COLORURINE STRAW*  LABSPEC 1.005  PHURINE 9.0*  GLUCOSEU 50*  HGBUR NEGATIVE  BILIRUBINUR NEGATIVE  KETONESUR NEGATIVE  PROTEINUR 100*  NITRITE NEGATIVE  LEUKOCYTESUR NEGATIVE      Imaging: Dg Chest 1 View  Result Date: 12/17/2015 CLINICAL DATA:  Respiratory failure, intubation EXAM: CHEST 1 VIEW COMPARISON:  Portable exam 1058 hours compared to 12/16/2015 FINDINGS: Tip of endotracheal tube projects 4.3 cm above carina. Nasogastric tube extends into stomach. RIGHT subclavian Port-A-Cath with tip projecting over SVC. Borderline enlargement of cardiac silhouette. Atherosclerotic calcification aorta. Patchy BILATERAL pulmonary infiltrates with perihilar and upper lobe predominance, question multifocal pneumonia. No pleural effusion or pneumothorax. Bones demineralized. IMPRESSION: New BILATERAL perihilar and upper lobe infiltrates greatest in RIGHT upper lobe question multifocal pneumonia, asymmetric edema less likely. Electronically Signed   By: Lavonia Dana M.D.   On: 12/17/2015 11:33   Dg Chest 1 View  Result Date: 12/16/2015 CLINICAL DATA:  Initial evaluation for possible pneumonia. EXAM: CHEST 1 VIEW COMPARISON:  Prior radiograph from 12/15/2015. FINDINGS: Right-sided pool blooming in hemodialysis catheter in  place, stable. Left-sided Port-A-Cath also unchanged. Moderate cardiomegaly. Mediastinal silhouette within normal limits. Prominent atheromatous plaque within the aortic arch. Lungs normally inflated. Mild diffuse vascular congestion without overt pulmonary edema. No new focal infiltrates identified to suggest pneumonia. No pleural effusion. No pneumothorax. Osseous structures are unchanged. IMPRESSION: 1. No new focal infiltrates to suggest pneumonia identified. 2. Stable cardiomegaly with mild diffuse pulmonary vascular congestion without overt pulmonary edema. Electronically Signed   By: Jeannine Boga M.D.   On: 12/16/2015 04:51   Dg Abd 1 View  Result Date: 12/17/2015 CLINICAL DATA:  Orogastric tube placement EXAM: ABDOMEN - 1 VIEW COMPARISON:  Portable exam 1102 hours without priors for comparison FINDINGS: Orogastric tube extends into mid stomach.  Linear subsegmental atelectasis medial LEFT lower lobe. RIGHT lung base clear. Visualized bowel gas pattern normal. IMPRESSION: Tip of orogastric tube projects over mid stomach. Electronically Signed   By: Lavonia Dana M.D.   On: 12/17/2015 11:34   Dg Chest Port 1 View  Result Date: 12/15/2015 CLINICAL DATA:  Cough and right lower rales.  Fever. EXAM: PORTABLE CHEST 1 VIEW COMPARISON:  09/22/2015 FINDINGS: Left subclavian porta catheter with tip at the upper cavoatrial junction. Right-sided dialysis catheter with tips at the SVC. There is no edema, consolidation, effusion, or pneumothorax. Borderline cardiomegaly. Stable aortic contours. IMPRESSION: No evidence of acute disease. Electronically Signed   By: Monte Fantasia M.D.   On: 12/15/2015 20:16     Medications:   . fentaNYL infusion INTRAVENOUS    . heparin     . ceFEPIme  1 g Intravenous Daily  . chlorhexidine gluconate (MEDLINE KIT)  15 mL Mouth Rinse BID  . cinacalcet  120 mg Oral Q breakfast  . fentaNYL      . fentaNYL (SUBLIMAZE) injection  50 mcg Intravenous Once  . furosemide       . furosemide  40 mg Intravenous Once  . heparin  3,000 Units Intravenous Once  . lidocaine  20 mL Intradermal Once  . losartan  100 mg Oral Once per day on Sun Tue Thu Sat  . mouth rinse  15 mL Mouth Rinse QID  . metoprolol  50 mg Oral 2 times per day on Sun Tue Thu Sat  . pantoprazole (PROTONIX) IV  40 mg Intravenous Q12H  . sertraline  100 mg Oral QHS  . sevelamer carbonate  2,400 mg Oral TID WC  . sodium chloride flush  3 mL Intravenous Q12H  . [START ON 12/18/2015] vancomycin  750 mg Intravenous Q M,W,F-HD   acetaminophen **OR** acetaminophen, bisacodyl, fentaNYL, ipratropium-albuterol, metoprolol, midazolam, midazolam, senna-docusate  Assessment/ Plan:  Ms. Mahathi Pokorney is a 60 y.o. black female with ESRD on HD MWF, hypertension, breast cancer, gout, history of DVT, psoriasis    [facility] nephrology/MWF/[address]  1. ESRD on HD MWF: last hemodialysis treatment on Friday. Now with flash pulmonary edema and respiratory failure requiring mechanical ventilation - Emergent hemodialysis today. Orders prepared.   2. Bacteremia/sepsis: 12/9 gram positive bacilli from [place]. Permcath removed. Blood cultures and cath tip cultures from [facility] are no growth - empiric cefepime and vanco  3. Anemia of chronic kidney disease: hemoglobin 7.3 - Avoid Epogen given recent diagnosis of breast cancer. - Appreciate oncology input.   4. Secondary hyperparathyroidism. -  hold cinacalcet and sevelamer      LOS: 2 Rodolphe Edmonston 12/10/201711:55 AM

## 2015-12-17 NOTE — Progress Notes (Addendum)
[place] [facility] [place] NAME: Teresa Cook    MR#:  [id number]  DATE OF BIRTH:  April 15, 1955  SUBJECTIVE: has shortness of breath, hypoxia, rapid response team is called this morning, transferred the patient to ICU. Patient had flash pulmonary edema requiring intubation. At cultures are negative so far. Admitted for sepsis, developed respiratory failure with pulmonary edema so transferred to ICU, now intubated.   CHIEF COMPLAINT:   Chief Complaint  Patient presents with  . Weakness    REVIEW OF SYSTEMS:  seen the patient before intubation, had obvious respiratory distress, using accessory muscles of respiration, had froth in the mouth.   Review of Systems  Constitutional: Negative for chills and fever.  HENT: Negative for hearing loss.   Eyes: Negative for blurred vision, double vision and photophobia.  Respiratory: Positive for shortness of breath. Negative for cough and hemoptysis.   Cardiovascular: Negative for palpitations, orthopnea and leg swelling.  Gastrointestinal: Negative for abdominal pain, diarrhea and vomiting.  Genitourinary: Negative for dysuria and urgency.  Musculoskeletal: Negative for myalgias and neck pain.  Skin: Negative for rash.  Neurological: Negative for dizziness, focal weakness, seizures, weakness and headaches.  Psychiatric/Behavioral: Negative for memory loss. The patient does not have insomnia.      DRUG ALLERGIES:   Allergies  Allergen Reactions  . Adhesive [Tape] Itching    Silk tape is ok to use.    VITALS:  Blood pressure (!) 194/128, pulse 88, temperature 99.1 F (37.3 C), temperature source Oral, resp. rate (!) 24, height 5\' 6"  (1.676 m), weight 62.3 kg (137 lb 4.8 oz), SpO2 97 %.  PHYSICAL EXAMINATION:  GENERAL:  60 y.o.-year-old patient lying in the bed with no acute distress.  Port  in the left anterior chest wall site looks okay but has tenderness. EYES: Pupils equal, round,  reactive to light and accommodation. No scleral icterus. Extraocular muscles intact.  HEENT: Head atraumatic, normocephalic. Oropharynx and nasopharynx clear.  NECK:  Supple, no jugular venous distention. No thyroid enlargement, no tenderness.  LUNGS: respiratory distress ,bilateral creps  All over the lung., using accessory muscles of respiration, developed hypoxia, tachypnea  CARDIOVASCULAR: S1, S2 normal. No murmurs, rubs, or gallops.  ABDOMEN: Soft, nontender, nondistended. Bowel sounds present. No organomegaly or mass.  EXTREMITIES: No pedal edema, cyanosis, or clubbing.  NEUROLOGIC:UNA ble to do full neurological exam because patient is in respiratory distress and now  Transferred to ICU  PSYCHIATRIC: The patient is getting lethargic. SKIN: No obvious rash, lesion, or ulcer.    LABORATORY PANEL:   CBC  Recent Labs Lab 12/17/15 0256  WBC 5.0  HGB 7.3*  HCT 21.7*  PLT 142*   ------------------------------------------------------------------------------------------------------------------  Chemistries   Recent Labs Lab 12/15/15 1952 12/16/15 0227  NA 142 143  K 3.6 3.3*  CL 111 112*  CO2 24 24  GLUCOSE 110* 98  BUN 11 13  CREATININE 3.36* 4.08*  CALCIUM 9.5 9.7  MG 1.7  --   AST 27  --   ALT 20  --   ALKPHOS 295*  --   BILITOT 0.4  --    ------------------------------------------------------------------------------------------------------------------  Cardiac Enzymes No results for input(s): TROPONINI in the last 168 hours. ------------------------------------------------------------------------------------------------------------------  RADIOLOGY:  Dg Chest 1 View  Result Date: 12/17/2015 CLINICAL DATA:  Respiratory failure, intubation EXAM: CHEST 1 VIEW COMPARISON:  Portable exam 1058 hours compared to 12/16/2015 FINDINGS: Tip of endotracheal tube projects 4.3 cm above carina. Nasogastric tube extends into stomach.  RIGHT subclavian Port-A-Cath with tip  projecting over SVC. Borderline enlargement of cardiac silhouette. Atherosclerotic calcification aorta. Patchy BILATERAL pulmonary infiltrates with perihilar and upper lobe predominance, question multifocal pneumonia. No pleural effusion or pneumothorax. Bones demineralized. IMPRESSION: New BILATERAL perihilar and upper lobe infiltrates greatest in RIGHT upper lobe question multifocal pneumonia, asymmetric edema less likely. Electronically Signed   By: Lavonia Dana M.D.   On: 12/17/2015 11:33   Dg Chest 1 View  Result Date: 12/16/2015 CLINICAL DATA:  Initial evaluation for possible pneumonia. EXAM: CHEST 1 VIEW COMPARISON:  Prior radiograph from 12/15/2015. FINDINGS: Right-sided pool blooming in hemodialysis catheter in place, stable. Left-sided Port-A-Cath also unchanged. Moderate cardiomegaly. Mediastinal silhouette within normal limits. Prominent atheromatous plaque within the aortic arch. Lungs normally inflated. Mild diffuse vascular congestion without overt pulmonary edema. No new focal infiltrates identified to suggest pneumonia. No pleural effusion. No pneumothorax. Osseous structures are unchanged. IMPRESSION: 1. No new focal infiltrates to suggest pneumonia identified. 2. Stable cardiomegaly with mild diffuse pulmonary vascular congestion without overt pulmonary edema. Electronically Signed   By: Jeannine Boga M.D.   On: 12/16/2015 04:51   Dg Abd 1 View  Result Date: 12/17/2015 CLINICAL DATA:  Orogastric tube placement EXAM: ABDOMEN - 1 VIEW COMPARISON:  Portable exam 1102 hours without priors for comparison FINDINGS: Orogastric tube extends into mid stomach. Linear subsegmental atelectasis medial LEFT lower lobe. RIGHT lung base clear. Visualized bowel gas pattern normal. IMPRESSION: Tip of orogastric tube projects over mid stomach. Electronically Signed   By: Lavonia Dana M.D.   On: 12/17/2015 11:34   Dg Chest Port 1 View  Result Date: 12/15/2015 CLINICAL DATA:  Cough and right lower  rales.  Fever. EXAM: PORTABLE CHEST 1 VIEW COMPARISON:  09/22/2015 FINDINGS: Left subclavian porta catheter with tip at the upper cavoatrial junction. Right-sided dialysis catheter with tips at the SVC. There is no edema, consolidation, effusion, or pneumothorax. Borderline cardiomegaly. Stable aortic contours. IMPRESSION: No evidence of acute disease. Electronically Signed   By: Monte Fantasia M.D.   On: 12/15/2015 20:16    EKG:   Orders placed or performed during the hospital encounter of 12/15/15  . ED EKG  . ED EKG  . EKG 12-Lead  . EKG 12-Lead     ACUTE RESPIRATORY FAILURE SECONDARY TO flash PULMONARY EDEMA: Intubated,spoke with the [facility] M, nephrology, patient permacath was removed by vascular yesterday,needs temporary hd.   #1 fever, cough,;Chest x-ray this morning showed multifocal pneumonia.  blood culture result the from nephrology office.showedaerobic bottle Showing gram-positive cocci. Source likely seeding from port. Andris Flurry.Continue vancomycin, Maxipime, ID consult, patient needs removal of the  Port,vascular surgery is consulted. Patient finished chemotherapy for breast cancer, last chemotherapy was 4 months ago.blood cultures from [facility] no growth.  Andris Flurry removed yesterday, tip sent for cultures. #2 anemia chronic: Hemoglobin stable at 7.1 point #3 peripheral neuropathy;hold by mouth medicines today. #4 history of hypertension  5. GERD #6depression;  #7 chronic pain due to cancer #8 history of left leg DVT;hold Coumadin today fori nsertion of dialysis access. Hypokalemia replaced.  18 ESRD on hemodialysis.;acute  pulmonary edema, requiring emergency hemodialysis, patient has a right femoral vein vascular catheter placed by PC CM. All the records are reviewed and case discussed with Care Management/Social Workerr. Management plans discussed with the patient, family and they are in agreement.  CODE STATUS: full  TOTAL TIME TAKING CARE OF THIS PATIENT:40  minutes critical care time POSSIBLE D/C IN 4-5, DEPENDING ON CLINICAL CONDITION.  Epifanio Lesches M.D on 12/17/2015 at 12:23 PM  Between 7am to 6pm - Pager - [phone]  After 6pm go to [email or web address] - password [email or web address] [facility]  Office  [phone]  CC: Primary care physician; Murlean Iba, MD   Note: This dictation was prepared with Dragon dictation along with smaller phrase technology. Any transcriptional errors that result from this process are unintentional.

## 2015-12-17 NOTE — Consult Note (Addendum)
PULMONARY / CRITICAL CARE MEDICINE   Name: Teresa Cook MRN: [id number] DOB: 02-08-1955    ADMISSION DATE:  12/15/2015 CONSULTATION DATE:  12/17/2015  REFERRING MD :  Dr. Vianne Bulls    CHIEF COMPLAINT:     Weakness   HISTORY OF PRESENT ILLNESS   60 y.o. female with a known history of End-stage renal disease on hemodialysis, CHF, DVT of hypertension, breast cancer presents to the emergency department complaining of cough, congestion and fever on 12/15/15.  Patient was in a usual state of health until one week ago when she developed worsening symptoms of chest congestion associated with cough productive of green sputum and fevers to 101 at home. She reports chest tightness and pain associated with coughing. She has been taking Augmentin for the past 3 days but has been worsening..  Patient finished chemotherapy in November 2017 and is planned for surgery in January 2018. Patient has dialysis Monday Wednesday and Friday. Admitted by hospitalist service, had suspicion of line infection (either RIJ per cath, or Left port-a-cath), 12/9 had permcath removed, on 12/10 To have high blood pressure, systolic in the 725D, complained about difficulty breathing, evaluated by nursing staff found to be in respiratory distress oxygen saturation initially was 90% with acutely dropped to 60s, rapid response was called, primary care team was also there, Lasix 40 mg as given per physician order, patient continued to be in respiratory distress she was transferred to the ICU, where she was noted to have white foam and frothing at the mouth at times, she was emergently intubated and placed on mechanical ventilation. Off note, she was scheduled to have her chemotherapy port removed on Monday, 12/18/2015.  SIGNIFICANT EVENTS   12/8> admitted for suspected line infection (either RIJ per cath, or Left port-a-cath) 12/9>R permcath removed 12/10> acute hypoxic respiratory failure, flash pulmonary edema, HTN, hypoxia,  intubated.   PAST MEDICAL HISTORY    :  Past Medical History:  Diagnosis Date  . Anemia   . Anxiety   . Breast cancer ([place])   . CHF (congestive heart failure) ([place])   . Chronic kidney disease   . Depression   . Dialysis patient ([place])   . DVT (deep venous thrombosis) ([facility])    left leg  . DVT (deep venous thrombosis) ([place])   . Gout   . HTN (hypertension)   . Hypertension   . Parathyroid abnormality ([place])   . Parathyroid disease ([place])   . Psoriasis   . Renal insufficiency   . Sickle cell anemia ([facility])    traits   Past Surgical History:  Procedure Laterality Date  . ABDOMINAL HYSTERECTOMY    . APPENDECTOMY    . BREAST BIOPSY Left 10/28/2013   benign  . BREAST EXCISIONAL BIOPSY Left 2002   benign  . INSERTION OF DIALYSIS CATHETER  2014  . PARTIAL HYSTERECTOMY    . PORTACATH PLACEMENT Left 08/21/2015   Procedure: INSERTION PORT-A-CATH;  Surgeon: Hubbard Robinson, MD;  Location: [facility] ORS;  Service: General;  Laterality: Left;   Prior to Admission medications   Medication Sig Start Date End Date Taking? Authorizing Provider  amoxicillin-clavulanate (AUGMENTIN) 500-125 MG tablet Take 1 tablet by mouth daily. 12/13/15  Yes Historical Provider, MD  benzonatate (TESSALON) 200 MG capsule Take 200 mg by mouth 3 (three) times daily as needed for cough.   Yes Historical Provider, MD  clonazePAM (KLONOPIN) 1 MG tablet Take 1 mg by mouth daily. May take 1 and 1/2 tablet if needed. 08/23/15  Yes Historical  Provider, MD  losartan (COZAAR) 50 MG tablet Take 100 mg by mouth daily. Takes on non-dialysis days=Tuesday, Thursday, Saturday and Sunday. 09/07/14  Yes Historical Provider, MD  metoprolol (LOPRESSOR) 100 MG tablet Take 50 mg by mouth 2 (two) times daily. Takes on non-dialysis days=Tuesday, Thursday, Saturday and Sunday.   Yes Historical Provider, MD  pantoprazole (PROTONIX) 40 MG tablet Take 1 tablet (40 mg total) by mouth 2 (two) times daily before a meal. 08/31/15  Yes Srikar Sudini, MD   sertraline (ZOLOFT) 100 MG tablet Take 100 mg by mouth daily.  03/05/12  Yes Historical Provider, MD  traMADol (ULTRAM) 50 MG tablet Take 50 mg by mouth every 6 (six) hours as needed.  02/20/15  Yes Historical Provider, MD  warfarin (COUMADIN) 5 MG tablet Take 1 tablet Friday, Saturday and Sundays. Take 1 1/2 tablets the other days of the week 11/03/13  Yes Historical Provider, MD  zolpidem (AMBIEN) 10 MG tablet Take 10 mg by mouth at bedtime as needed for sleep.   Yes Historical Provider, MD   Allergies  Allergen Reactions  . Adhesive [Tape] Itching    Silk tape is ok to use.     FAMILY HISTORY   Family History  Problem Relation Age of Onset  . Stroke Mother   . CVA Mother   . Hypertension Mother   . Hypertension Father   . Hypertension Sister   . Diabetes Sister   . Cancer Sister 70    Breast  . Stroke Brother   . Hypertension Brother   . Breast cancer Maternal Aunt 70      SOCIAL HISTORY    reports that she has never smoked. She has never used smokeless tobacco. She reports that she does not drink alcohol or use drugs.  Review of Systems  Unable to perform ROS: Critical illness      VITAL SIGNS    Temp:  [98.7 F (37.1 C)-99.4 F (37.4 C)] 99.1 F (37.3 C) (12/10 0432) Pulse Rate:  [84-105] 105 (12/10 0432) Resp:  [16-22] 22 (12/10 0432) BP: (149-194)/(81-109) 187/109 (12/10 0432) SpO2:  [92 %-97 %] 92 % (12/10 0456) HEMODYNAMICS:   VENTILATOR SETTINGS:   INTAKE / OUTPUT:  Intake/Output Summary (Last 24 hours) at 12/17/15 1053 Last data filed at 12/16/15 2044  Gross per 24 hour  Intake              530 ml  Output                0 ml  Net              53 0 ml       PHYSICAL EXAM   Physical Exam  Constitutional: She appears distressed.  HENT:  Head: Normocephalic and atraumatic.  Eyes: Conjunctivae and EOM are normal. Pupils are equal, round, and reactive to light.  Neck: Normal range of motion. Neck supple.  Cardiovascular: Exam reveals no  friction rub.   No murmur heard. tachycardia  Pulmonary/Chest: She is in respiratory distress. She has wheezes. She has rales.  Abdominal: Bowel sounds are normal. She exhibits no distension.  Musculoskeletal: She exhibits no edema.  Neurological:  Disoriented  Skin: Skin is warm and dry.  Nursing note and vitals reviewed.      LABS   LABS:  CBC  Recent Labs Lab 12/15/15 1952 12/16/15 0227 12/17/15 0256  WBC 4.2 4.2 5.0  HGB 7.1* 7.1* 7.3*  HCT 21.4* 20.8* 21.7*  PLT 142* 128* 142*  Coag's  Recent Labs Lab 12/16/15 0227 12/17/15 0256  INR 1.85 1.78   BMET  Recent Labs Lab 12/15/15 1952 12/16/15 0227  NA 142 143  K 3.6 3.3*  CL 111 112*  CO2 24 24  BUN 11 13  CREATININE 3.36* 4.08*  GLUCOSE 110* 98   Electrolytes  Recent Labs Lab 12/15/15 1952 12/16/15 0227  CALCIUM 9.5 9.7  MG 1.7  --   PHOS 3.3  --    Sepsis Markers  Recent Labs Lab 12/15/15 1952  LATICACIDVEN 1.1   ABG No results for input(s): PHART, PCO2ART, PO2ART in the last 168 hours. Liver Enzymes  Recent Labs Lab 12/15/15 1952  AST 27  ALT 20  ALKPHOS 295*  BILITOT 0.4  ALBUMIN 3.1*   Cardiac Enzymes No results for input(s): TROPONINI, PROBNP in the last 168 hours. Glucose  Recent Labs Lab 12/17/15 0223  GLUCAP 105*     Recent Results (from the past 240 hour(s))  Blood Culture (routine x 2)     Status: None (Preliminary result)   Collection Time: 12/15/15  7:52 PM  Result Value Ref Range Status   Specimen Description BLOOD LEFT ASSIST CONTROL  Final   Special Requests   Final    BOTTLES DRAWN AEROBIC AND ANAEROBIC 12CCAERO,14CCANA   Culture NO GROWTH 2 DAYS  Final   Report Status PENDING  Incomplete  Blood Culture (routine x 2)     Status: None (Preliminary result)   Collection Time: 12/15/15  7:53 PM  Result Value Ref Range Status   Specimen Description BLOOD L HAND  Final   Special Requests   Final    BOTTLES DRAWN AEROBIC AND ANAEROBIC AER 11CC,  ANA 10CC   Culture NO GROWTH 2 DAYS  Final   Report Status PENDING  Incomplete  Urine culture     Status: None   Collection Time: 12/15/15  8:42 PM  Result Value Ref Range Status   Specimen Description URINE, RANDOM  Final   Special Requests NONE  Final   Culture NO GROWTH Performed at [facility]   Final   Report Status 12/17/2015 FINAL  Final  Culture, sputum-assessment     Status: None   Collection Time: 12/15/15 11:31 PM  Result Value Ref Range Status   Specimen Description EXPECTORATED SPUTUM  Final   Special Requests Immunocompromised  Final   Sputum evaluation   Final    Sputum specimen not acceptable for testing.  Please recollect.   [name] AT 6759 12/16/15.PMH   Report Status 12/16/2015 FINAL  Final  MRSA PCR Screening     Status: None   Collection Time: 12/15/15 11:31 PM  Result Value Ref Range Status   MRSA by PCR NEGATIVE NEGATIVE Final    Comment:        The GeneXpert MRSA Assay (FDA approved for NASAL specimens only), is one component of a comprehensive MRSA colonization surveillance program. It is not intended to diagnose MRSA infection nor to guide or monitor treatment for MRSA infections.   Cath Tip Culture     Status: None (Preliminary result)   Collection Time: 12/16/15  3:30 PM  Result Value Ref Range Status   Specimen Description HEMODIALYSIS CATHETER  Final   Special Requests NONE  Final   Culture   Final    NO GROWTH < 24 HOURS Performed at [facility]    Report Status PENDING  Incomplete     Current Facility-Administered Medications:  .  acetaminophen (TYLENOL) tablet  650 mg, 650 mg, Oral, Q6H PRN, 650 mg at 12/16/15 1340 **OR** acetaminophen (TYLENOL) suppository 650 mg, 650 mg, Rectal, Q6H PRN, Alexis Hugelmeyer, DO .  bisacodyl (DULCOLAX) EC tablet 5 mg, 5 mg, Oral, Daily PRN, Alexis Hugelmeyer, DO .  ceFEPIme (MAXIPIME) 1 GM / 85mL IVPB premix, 1 g, Intravenous, Daily, Alexis Hugelmeyer, DO, 1 g at 12/16/15  1717 .  cinacalcet (SENSIPAR) tablet 120 mg, 120 mg, Oral, Q breakfast, Alexis Hugelmeyer, DO, 120 mg at 12/16/15 0800 .  clonazePAM (KLONOPIN) tablet 0.5-1 mg, 0.5-1 mg, Oral, Daily, Alexis Hugelmeyer, DO, 0.5 mg at 12/16/15 0928 .  clonazePAM (KLONOPIN) tablet 1 mg, 1 mg, Oral, Daily, Pavan Pyreddy, MD .  guaiFENesin (MUCINEX) 12 hr tablet 600 mg, 600 mg, Oral, BID, 600 mg at 12/16/15 2113 **AND** dextromethorphan (DELSYM) 30 MG/5ML liquid 30 mg, 30 mg, Oral, BID, Alexis Hugelmeyer, DO, 30 mg at 12/16/15 2114 .  furosemide (LASIX) 10 MG/ML injection, , , ,  .  furosemide (LASIX) injection 40 mg, 40 mg, Intravenous, Once, Epifanio Lesches, MD .  gabapentin (NEURONTIN) capsule 200 mg, 200 mg, Oral, TID PRN, Alexis Hugelmeyer, DO .  heparin injection 5,000 Units, 5,000 Units, Subcutaneous, Q8H, Alexis Hugelmeyer, DO, 5,000 Units at 12/17/15 [phone] .  ipratropium-albuterol (DUONEB) 0.5-2.5 (3) MG/3ML nebulizer solution 3 mL, 3 mL, Nebulization, Q6H PRN, Alexis Hugelmeyer, DO, 3 mL at 12/17/15 0455 .  lidocaine (XYLOCAINE) 1 % (with pres) injection 20 mL, 20 mL, Intradermal, Once, Lavonia Dana, MD .  lidocaine-prilocaine (EMLA) cream 1 application, 1 application, Topical, PRN, Alexis Hugelmeyer, DO .  losartan (COZAAR) tablet 100 mg, 100 mg, Oral, Once per day on Sun Tue Thu Sat, Alexis Hugelmeyer, DO, 100 mg at 12/16/15 7322 .  magnesium citrate solution 1 Bottle, 1 Bottle, Oral, Once PRN, Alexis Hugelmeyer, DO .  menthol-cetylpyridinium (CEPACOL) lozenge 3 mg, 1 lozenge, Oral, PRN, Alexis Hugelmeyer, DO, 3 mg at 12/17/15 0206 .  metoprolol (LOPRESSOR) tablet 50 mg, 50 mg, Oral, 2 times per day on Sun Tue Thu Sat, Ubaldo Glassing Hugelmeyer, DO, 50 mg at 12/16/15 2113 .  ondansetron (ZOFRAN) tablet 8 mg, 8 mg, Oral, Q8H PRN, Alexis Hugelmeyer, DO, 8 mg at 12/17/15 0959 .  pantoprazole (PROTONIX) EC tablet 40 mg, 40 mg, Oral, BID AC, Alexis Hugelmeyer, DO, 40 mg at 12/16/15 1717 .  prochlorperazine  (COMPAZINE) tablet 10 mg, 10 mg, Oral, Q6H PRN, Alexis Hugelmeyer, DO .  senna-docusate (Senokot-S) tablet 1 tablet, 1 tablet, Oral, QHS PRN, Alexis Hugelmeyer, DO .  sertraline (ZOLOFT) tablet 100 mg, 100 mg, Oral, QHS, Alexis Hugelmeyer, DO, 100 mg at 12/16/15 2114 .  sevelamer carbonate (RENVELA) tablet 2,400 mg, 2,400 mg, Oral, TID WC, Alexis Hugelmeyer, DO, 2,400 mg at 12/16/15 1718 .  sodium chloride flush (NS) 0.9 % injection 3 mL, 3 mL, Intravenous, Q12H, Alexis Hugelmeyer, DO, 3 mL at 12/16/15 2114 .  traMADol (ULTRAM) tablet 50 mg, 50 mg, Oral, Q6H PRN, Alexis Hugelmeyer, DO, 50 mg at 12/16/15 2113 .  [START ON 12/18/2015] vancomycin (VANCOCIN) IVPB 750 mg/150 ml premix, 750 mg, Intravenous, Q M,W,F-HD, Alexis Hugelmeyer, DO .  warfarin (COUMADIN) tablet 5 mg, 5 mg, Oral, Once per day on Sun Fri Sat, 5 mg at 12/16/15 1717 **AND** [START ON 12/18/2015] warfarin (COUMADIN) tablet 7.5 mg, 7.5 mg, Oral, Once per day on Mon Tue Wed Thu, [employer], DO .  Warfarin - Pharmacist Dosing Inpatient, , Does not apply, q1800, Alexis Hugelmeyer, DO .  zolpidem (AMBIEN) tablet 10 mg, 10  mg, Oral, QHS PRN, Alexis Hugelmeyer, DO, 10 mg at 12/16/15 2112  IMAGING    No results found.    Indwelling Urinary Catheter continued, requirement due to   Reason to continue Indwelling Urinary Catheter for strict Intake/Output monitoring for hemodynamic instability   Central Line continued, requirement due to   Reason to continue [employer] Monitoring of central venous pressure or other hemodynamic parameters   Ventilator continued, requirement due to, resp failure    Ventilator Sedation RASS 0 to -2   Cultures: BCx2 12/8 UC  Sputum  Antibiotics: Vanc 12/8> Cefepime 12/8>  Lines: Vascath Fem CVL Fem  DISCUSSION: 60 year old female past medical history of end-stage renal disease on hemodialysis, CHF, DVT, hypertension, breast cancer, last chemotherapy treatment was in November,  initially presented on 12/8 with complaint of coughing, congestion and fever, admitted by hospitalist team for suspected sepsis from underlying line infection. On 12/10, noted to be in severe respiratory distress with flash pulmonary edema and hypertension, emergently intubated and transferred to the ICU.  ASSESSMENT/PLAN   PULMONARY Acute hypoxic respiratory failure Flash pulmonary edema Right greater than left lung infiltrates, most likely fluid P:   Emergently intubated, continue mechanical ventilation, wean as tolerated Keep O2 sats greater than 88% Check ABG CXR  Placed vascath and plan for session of dialysis  CARDIOVASCULAR A: HTN urgency P:  Now intubated, SBP trending down with sedation Will cont with BP meds Check EKG and troponin  RENAL ESRD on HD P:   RIJ permcath removed due to suspicion on line infection on 12/9 Place vascath and plan for session of dialysis  GASTROINTESTINAL PUD NPO FEN - TF  HEMATOLOGIC Hx of Breast Ca Hx of Left Leg DVT on warfarin P:  Now s/p chemo on November Spoke with Heme\onc  - no further chemo planned, okay to remove port Stop warfarin, will start hep gtt.   INFECTIOUS Sepsis - 2/2 to possible port of permcath infection P:   -cont with above abx -f\u on cultures -per Dr. Lorenso Courier, obtain a culture from left port-a-cath prior to removal   ENDOCRINE ICU hypo/hyperglycemia protocol  NEUROLOGIC RASS goal: -1 Intubated and sedated fent/versed   Called and updated husband over the phone.   I have personally obtained a history, examined the patient, evaluated laboratory and imaging results, formulated the assessment and plan and placed orders.  The Patient requires high complexity decision making for assessment and support, frequent evaluation and titration of therapies, application of advanced monitoring technologies and extensive interpretation of multiple databases. Critical Care Time devoted to patient care services  described in this note is 55 minutes.   Overall, patient is critically ill, prognosis is guarded. Patient at high risk for cardiac arrest and death.   Vilinda Boehringer, MD [place] [facility] Pager [phone] (please enter 7-digits) On Call Pager [phone] (please enter 7-digits)     12/17/2015, 10:53 AM  Note: This note was prepared with Dragon dictation along with smaller phrase technology. Any transcriptional errors that result from this process are unintentional.

## 2015-12-17 NOTE — Procedures (Signed)
Procedure Note:  Orotracheal Intubation  Implied consent due to emergent nature of patient's condition. Correct Patient, Name & ID confirmed.  The patient was pre-oxygenated and then, under direct visualization, a 8.0 mm cuffed endotracheal tube was placed through the vocal cords into the trachea, using the Glidescope, frothy secretions noted around the cord.  Total attempts made 1, using Glidescope During intubation an assistant applied gentle pressure to the cricoid cartilage.  Position confirmed by auscultation of lungs (good breath sounds bilaterally) and no stomach sounds.  Tube secured at 22 cm. Pulse ox 98 %. CO2 detector in place with appropriate color change.   Pt tolerated procedure well.  No complications were noted.   CXR ordered.   Vilinda Boehringer, MD [place] [facility] Pager [phone] On Call Pager [phone]

## 2015-12-17 NOTE — Procedures (Signed)
  Procedure Note: Right Femoral VasCath [name] , [id number] , IC08A/IC08A-AA  Indications: Hemodynamic monitoring / Intravenous access  Emergent placement of A time-out was completed verifying correct patient, procedure and site.  A 3 lumen catheter available at the time of procedure.  The patient was placed in a dependent position appropriate for central line placement based on the vein to be cannulated.   The patient's RIGHT Femoral Vein was prepped and draped in a sterile fashion.  1% Lidocaine WAS NOT used to anesthetize the surrounding skin area.   A 2 lumen vas catheter was introduced into the RIGHT Femoral Vein using Seldinger technique, visualized under ultrasound.  The catheter was threaded smoothly over the guide wire and appropriate blood return was obtained.  Each lumen of the catheter was evacuated of air and flushed with sterile saline.  The catheter was then sutured in place to the skin and a sterile dressing applied.  Perfusion to the extremity distal to the point of catheter insertion was checked and found to be adequate.     The patient tolerated the procedure well and there were no complications.  Vilinda Boehringer, MD [place] [facility] Pager [phone] (please enter 7-digits) On Call Pager - [phone] (please enter 7-digits)

## 2015-12-18 ENCOUNTER — Inpatient Hospital Stay: Payer: Medicare Other

## 2015-12-18 ENCOUNTER — Other Ambulatory Visit: Payer: Self-pay

## 2015-12-18 LAB — BLOOD GAS, ARTERIAL
ACID-BASE EXCESS: 7.2 mmol/L — AB (ref 0.0–2.0)
BICARBONATE: 29.9 mmol/L — AB (ref 20.0–28.0)
FIO2: 0.5
MECHANICAL RATE: 15
MECHVT: 500 mL
O2 Saturation: 99.2 %
PATIENT TEMPERATURE: 37
PEEP/CPAP: 8 cmH2O
pCO2 arterial: 35 mmHg (ref 32.0–48.0)
pH, Arterial: 7.54 — ABNORMAL HIGH (ref 7.350–7.450)
pO2, Arterial: 126 mmHg — ABNORMAL HIGH (ref 83.0–108.0)

## 2015-12-18 LAB — COMPREHENSIVE METABOLIC PANEL
ALT: 14 U/L (ref 14–54)
AST: 23 U/L (ref 15–41)
Albumin: 2.6 g/dL — ABNORMAL LOW (ref 3.5–5.0)
Alkaline Phosphatase: 171 U/L — ABNORMAL HIGH (ref 38–126)
Anion gap: 7 (ref 5–15)
BUN: 21 mg/dL — AB (ref 6–20)
CO2: 29 mmol/L (ref 22–32)
Calcium: 8.8 mg/dL — ABNORMAL LOW (ref 8.9–10.3)
Chloride: 101 mmol/L (ref 101–111)
Creatinine, Ser: 3.91 mg/dL — ABNORMAL HIGH (ref 0.44–1.00)
GFR calc Af Amer: 13 mL/min — ABNORMAL LOW (ref >60)
GFR calc non Af Amer: 11 mL/min — ABNORMAL LOW (ref >60)
Glucose, Bld: 108 mg/dL — ABNORMAL HIGH (ref 65–99)
POTASSIUM: 3.5 mmol/L (ref 3.5–5.1)
Sodium: 137 mmol/L (ref 135–145)
Total Bilirubin: 0.7 mg/dL (ref 0.3–1.2)
Total Protein: 5.3 g/dL — ABNORMAL LOW (ref 6.5–8.1)

## 2015-12-18 LAB — CBC
HEMATOCRIT: 18.3 % — AB (ref 35.0–47.0)
Hemoglobin: 6.1 g/dL — ABNORMAL LOW (ref 12.0–16.0)
MCH: 28.5 pg (ref 26.0–34.0)
MCHC: 33.3 g/dL (ref 32.0–36.0)
MCV: 85.5 fL (ref 80.0–100.0)
Platelets: 119 10*3/uL — ABNORMAL LOW (ref 150–440)
RBC: 2.13 MIL/uL — ABNORMAL LOW (ref 3.80–5.20)
RDW: 17.8 % — AB (ref 11.5–14.5)
WBC: 8.7 10*3/uL (ref 3.6–11.0)

## 2015-12-18 LAB — GLUCOSE, CAPILLARY
GLUCOSE-CAPILLARY: 106 mg/dL — AB (ref 65–99)
GLUCOSE-CAPILLARY: 107 mg/dL — AB (ref 65–99)
GLUCOSE-CAPILLARY: 88 mg/dL (ref 65–99)
Glucose-Capillary: 115 mg/dL — ABNORMAL HIGH (ref 65–99)
Glucose-Capillary: 118 mg/dL — ABNORMAL HIGH (ref 65–99)
Glucose-Capillary: 138 mg/dL — ABNORMAL HIGH (ref 65–99)
Glucose-Capillary: 94 mg/dL (ref 65–99)

## 2015-12-18 LAB — HEPARIN LEVEL (UNFRACTIONATED): Heparin Unfractionated: 0.75 IU/mL — ABNORMAL HIGH (ref 0.30–0.70)

## 2015-12-18 LAB — PROTIME-INR
INR: 2.91
PROTHROMBIN TIME: 31 s — AB (ref 11.4–15.2)

## 2015-12-18 LAB — MAGNESIUM
Magnesium: 1.7 mg/dL (ref 1.7–2.4)
Magnesium: 2.1 mg/dL (ref 1.7–2.4)

## 2015-12-18 LAB — PHOSPHORUS
Phosphorus: 3.4 mg/dL (ref 2.5–4.6)
Phosphorus: 3.5 mg/dL (ref 2.5–4.6)

## 2015-12-18 LAB — HEMOGLOBIN AND HEMATOCRIT, BLOOD
HCT: 21.3 % — ABNORMAL LOW (ref 35.0–47.0)
HEMOGLOBIN: 7.1 g/dL — AB (ref 12.0–16.0)

## 2015-12-18 LAB — TROPONIN I: TROPONIN I: 0.15 ng/mL — AB (ref <0.03)

## 2015-12-18 LAB — PROCALCITONIN: Procalcitonin: 64.24 ng/mL

## 2015-12-18 LAB — HEPATITIS B SURFACE ANTIGEN: HEP B S AG: NEGATIVE

## 2015-12-18 LAB — HEPATITIS B SURFACE ANTIBODY,QUALITATIVE: Hep B S Ab: REACTIVE

## 2015-12-18 LAB — HEPATITIS B CORE ANTIBODY, TOTAL: Hep B Core Total Ab: NEGATIVE

## 2015-12-18 LAB — PREPARE RBC (CROSSMATCH)

## 2015-12-18 MED ORDER — SERTRALINE HCL 50 MG PO TABS
50.0000 mg | ORAL_TABLET | Freq: Every day | ORAL | Status: DC
  Administered 2015-12-18 – 2015-12-27 (×11): 50 mg via ORAL
  Filled 2015-12-18 (×11): qty 1

## 2015-12-18 MED ORDER — DIPHENHYDRAMINE HCL 50 MG/ML IJ SOLN
50.0000 mg | Freq: Once | INTRAMUSCULAR | Status: AC
  Administered 2015-12-18: 50 mg via INTRAVENOUS
  Filled 2015-12-18: qty 1

## 2015-12-18 MED ORDER — PANTOPRAZOLE SODIUM 40 MG PO TBEC
40.0000 mg | DELAYED_RELEASE_TABLET | Freq: Two times a day (BID) | ORAL | Status: DC
  Administered 2015-12-18 – 2015-12-28 (×16): 40 mg via ORAL
  Filled 2015-12-18 (×18): qty 1

## 2015-12-18 MED ORDER — SODIUM CHLORIDE 0.9 % IV SOLN
Freq: Once | INTRAVENOUS | Status: AC
  Administered 2015-12-18: 16:00:00 via INTRAVENOUS

## 2015-12-18 MED ORDER — BENZONATATE 100 MG PO CAPS
100.0000 mg | ORAL_CAPSULE | Freq: Three times a day (TID) | ORAL | Status: DC | PRN
  Administered 2015-12-18 – 2015-12-26 (×7): 100 mg via ORAL
  Filled 2015-12-18 (×7): qty 1

## 2015-12-18 MED ORDER — DIPHENHYDRAMINE HCL 25 MG PO CAPS
25.0000 mg | ORAL_CAPSULE | Freq: Three times a day (TID) | ORAL | Status: DC | PRN
  Administered 2015-12-18: 25 mg via ORAL
  Filled 2015-12-18: qty 1

## 2015-12-18 MED ORDER — BOOST / RESOURCE BREEZE PO LIQD
1.0000 | Freq: Three times a day (TID) | ORAL | Status: DC
  Administered 2015-12-18 – 2015-12-20 (×3): 1 via ORAL

## 2015-12-18 MED ORDER — MAGNESIUM SULFATE IN D5W 1-5 GM/100ML-% IV SOLN
1.0000 g | Freq: Once | INTRAVENOUS | Status: AC
  Administered 2015-12-18: 1 g via INTRAVENOUS
  Filled 2015-12-18: qty 100

## 2015-12-18 MED ORDER — ORAL CARE MOUTH RINSE
15.0000 mL | Freq: Two times a day (BID) | OROMUCOSAL | Status: DC
  Administered 2015-12-18 – 2015-12-27 (×13): 15 mL via OROMUCOSAL

## 2015-12-18 MED ORDER — SODIUM CHLORIDE 0.9 % IV BOLUS (SEPSIS)
250.0000 mL | Freq: Once | INTRAVENOUS | Status: AC
  Administered 2015-12-18: 250 mL via INTRAVENOUS

## 2015-12-18 NOTE — Progress Notes (Signed)
[facility]  ROUNDING NOTE   Subjective:   Permcath removed . Cath tip sent. No growth  Patient went into respiratory distress and was transferred to ICU and intubated for mechanical ventilation. CXR with new right upper and middle lobe infiltrates. Left temp HD catheter placed by Dr. Stevenson Clinch.  Emergent HD done on Sunday. 1100 cc of fluid was removed Extubated this morning Voice hoarse. Denies acute complaints  Objective:  Vital signs in last 24 hours:  Temp:  [97.6 F (36.4 C)-99.1 F (37.3 C)] 98.6 F (37 C) (12/11 0800) Pulse Rate:  [69-119] 94 (12/11 0800) Resp:  [0-39] 23 (12/11 0800) BP: (60-194)/(45-145) 108/73 (12/11 0800) SpO2:  [81 %-100 %] 98 % (12/11 0800) FiO2 (%):  [40 %-100 %] 40 % (12/11 0730) Weight:  [76.1 kg (167 lb 12.3 oz)] 76.1 kg (167 lb 12.3 oz) (12/10 1445)  Weight change:  Filed Weights   12/15/15 1850 12/15/15 2238 12/17/15 1445  Weight: 64 kg (141 lb) 62.3 kg (137 lb 4.8 oz) 76.1 kg (167 lb 12.3 oz)    Intake/Output: I/O last 3 completed shifts: In: 913.6 [I.V.:397.7; NG/GT:265.8; IV Piggyback:250] Out: 8937 [Other:1154]   Intake/Output this shift:  Total I/O In: 93.3 [I.V.:93.3] Out: -   Physical Exam: General: Critically ill  ENT Moist oral mucus membranes  Eyes: Eyes closed  Neck:  supple  Lungs:  Bilateral rhonchi, mild basilar crackles  Heart: Irregular, A Fib  Abdomen:  Soft, nontender  Extremities: no peripheral edema.  Neurologic: Alert, oriented  Skin: No lesions  Access:  rt fem temp HD catheter 12/10 Dr. Stevenson Clinch    Basic Metabolic Panel:  Recent Labs Lab 12/15/15 1952 12/16/15 0227 12/17/15 1300 12/17/15 1521 12/18/15 0502  NA 142 143 137  --  137  K 3.6 3.3* 4.2  --  3.5  CL 111 112* 105  --  101  CO2 _0 --  29  GLUCOSE 110* 98 153*  --  108*  BUN 11 13 26*  --  21*  CREATININE 3.36* 4.08* 7.10*  --  3.91*  CALCIUM 9.5 9.7 8.0*  --  8.8*  MG 1.7  --  1.9 1.8 1.7  PHOS 3.3  --  5.1* 2.9  3.4    Liver Function Tests:  Recent Labs Lab 12/15/15 1952 12/18/15 0502  AST 27 23  ALT 20 14  ALKPHOS 295* 171*  BILITOT 0.4 0.7  PROT 6.4* 5.3*  ALBUMIN 3.1* 2.6*   No results for input(s): LIPASE, AMYLASE in the last 168 hours. No results for input(s): AMMONIA in the last 168 hours.  CBC:  Recent Labs Lab 12/15/15 1952 12/16/15 0227 12/17/15 0256 12/17/15 1300 12/18/15 0502  WBC 4.2 4.2 5.0 10.3 8.7  NEUTROABS 3.0  --   --  8.9*  --   HGB 7.1* 7.1* 7.3* 7.7* 6.1*  HCT 21.4* 20.8* 21.7* 23.1* 18.3*  MCV 86.1 85.6 86.5 85.3 85.5  PLT 142* 128* 142* 178 119*    Cardiac Enzymes:  Recent Labs Lab 12/17/15 1300 12/17/15 1820 12/18/15 0157  TROPONINI 0.06* 0.11* 0.15*    BNP: Invalid input(s): POCBNP  CBG:  Recent Labs Lab 12/17/15 1547 12/17/15 2008 12/18/15 0005 12/18/15 0421 12/18/15 0739  GLUCAP 106* 110* 138* 115* 110    Microbiology: Results for orders placed or performed during the hospital encounter of 12/15/15  Blood Culture (routine x 2)     Status: None (Preliminary result)   Collection Time: 12/15/15  7:52 PM  Result Value Ref Range Status   Specimen Description BLOOD LEFT ASSIST CONTROL  Final   Special Requests   Final    BOTTLES DRAWN AEROBIC AND ANAEROBIC 12CCAERO,14CCANA   Culture NO GROWTH 3 DAYS  Final   Report Status PENDING  Incomplete  Blood Culture (routine x 2)     Status: None (Preliminary result)   Collection Time: 12/15/15  7:53 PM  Result Value Ref Range Status   Specimen Description BLOOD L HAND  Final   Special Requests   Final    BOTTLES DRAWN AEROBIC AND ANAEROBIC AER 11CC, ANA 10CC   Culture NO GROWTH 3 DAYS  Final   Report Status PENDING  Incomplete  Urine culture     Status: None   Collection Time: 12/15/15  8:42 PM  Result Value Ref Range Status   Specimen Description URINE, RANDOM  Final   Special Requests NONE  Final   Culture NO GROWTH Performed at [facility]   Final   Report Status  12/17/2015 FINAL  Final  Culture, sputum-assessment     Status: None   Collection Time: 12/15/15 11:31 PM  Result Value Ref Range Status   Specimen Description EXPECTORATED SPUTUM  Final   Special Requests Immunocompromised  Final   Sputum evaluation   Final    Sputum specimen not acceptable for testing.  Please recollect.   [name] AT 1610 12/16/15.PMH   Report Status 12/16/2015 FINAL  Final  MRSA PCR Screening     Status: None   Collection Time: 12/15/15 11:31 PM  Result Value Ref Range Status   MRSA by PCR NEGATIVE NEGATIVE Final    Comment:        The GeneXpert MRSA Assay (FDA approved for NASAL specimens only), is one component of a comprehensive MRSA colonization surveillance program. It is not intended to diagnose MRSA infection nor to guide or monitor treatment for MRSA infections.   Cath Tip Culture     Status: None (Preliminary result)   Collection Time: 12/16/15  3:30 PM  Result Value Ref Range Status   Specimen Description HEMODIALYSIS CATHETER  Final   Special Requests NONE  Final   Culture   Final    NO GROWTH < 24 HOURS Performed at [facility]    Report Status PENDING  Incomplete  MRSA PCR Screening     Status: None   Collection Time: 12/17/15 11:32 AM  Result Value Ref Range Status   MRSA by PCR NEGATIVE NEGATIVE Final    Comment:        The GeneXpert MRSA Assay (FDA approved for NASAL specimens only), is one component of a comprehensive MRSA colonization surveillance program. It is not intended to diagnose MRSA infection nor to guide or monitor treatment for MRSA infections.   Culture, blood (single) w Reflex to ID Panel     Status: None (Preliminary result)   Collection Time: 12/17/15  5:30 PM  Result Value Ref Range Status   Specimen Description PORTA CATH  Final   Special Requests BOTTLES DRAWN AEROBIC AND ANAEROBIC 5ML  Final   Culture NO GROWTH < 24 HOURS  Final   Report Status PENDING  Incomplete    Coagulation  Studies:  Recent Labs  12/16/15 0227 12/17/15 0256 12/18/15 0502  LABPROT 21.6* 20.9* 31.0*  INR 1.85 1.78 2.91    Urinalysis:  Recent Labs  12/15/15 2042  COLORURINE STRAW*  LABSPEC 1.005  PHURINE 9.0*  GLUCOSEU 50*  HGBUR NEGATIVE  BILIRUBINUR NEGATIVE  KETONESUR NEGATIVE  PROTEINUR 100*  NITRITE NEGATIVE  LEUKOCYTESUR NEGATIVE      Imaging: Dg Chest 1 View  Result Date: 12/17/2015 CLINICAL DATA:  Respiratory failure, intubation EXAM: CHEST 1 VIEW COMPARISON:  Portable exam 1058 hours compared to 12/16/2015 FINDINGS: Tip of endotracheal tube projects 4.3 cm above carina. Nasogastric tube extends into stomach. RIGHT subclavian Port-A-Cath with tip projecting over SVC. Borderline enlargement of cardiac silhouette. Atherosclerotic calcification aorta. Patchy BILATERAL pulmonary infiltrates with perihilar and upper lobe predominance, question multifocal pneumonia. No pleural effusion or pneumothorax. Bones demineralized. IMPRESSION: New BILATERAL perihilar and upper lobe infiltrates greatest in RIGHT upper lobe question multifocal pneumonia, asymmetric edema less likely. Electronically Signed   By: Lavonia Dana M.D.   On: 12/17/2015 11:33   Dg Abd 1 View  Result Date: 12/17/2015 CLINICAL DATA:  Orogastric tube placement EXAM: ABDOMEN - 1 VIEW COMPARISON:  Portable exam 1102 hours without priors for comparison FINDINGS: Orogastric tube extends into mid stomach. Linear subsegmental atelectasis medial LEFT lower lobe. RIGHT lung base clear. Visualized bowel gas pattern normal. IMPRESSION: Tip of orogastric tube projects over mid stomach. Electronically Signed   By: Lavonia Dana M.D.   On: 12/17/2015 11:34   Portable Chest Xray  Result Date: 12/18/2015 CLINICAL DATA:  Respiratory failure, sepsis, chronic CHF EXAM: PORTABLE CHEST 1 VIEW COMPARISON:  Chest x-ray of December 17, 2015 FINDINGS: The lungs are well-expanded. There is persistent infiltrate in the right upper lobe which  has decreased slightly. Infiltrate in the left CT perihilar region has also improved. The cardiac silhouette is top-normal in size. The pulmonary vascularity is not clearly engorged. There is calcification in the wall of the aortic arch. The endotracheal tube tip lies 3.7 cm above the carina. The esophagogastric tube tip projects below the inferior margin of the image. The porta catheter tip projects at the cavoatrial junction. There is chronic abnormality of the posterior aspect of the right eighth rib. IMPRESSION: Slight interval improvement in the alveolar opacities bilaterally. Pneumonia is favored though asymmetric pulmonary edema could present similarly. The support tubes are in reasonable position. Thoracic aortic atherosclerosis. Electronically Signed   By: David  Martinique M.D.   On: 12/18/2015 07:17     Medications:   . fentaNYL infusion INTRAVENOUS Stopped (12/18/15 0728)  . heparin 850 Units/hr (12/18/15 0919)   . sodium chloride   Intravenous Once  . aspirin  81 mg Oral Daily  . ceFEPIme  1 g Intravenous Daily  . chlorhexidine gluconate (MEDLINE KIT)  15 mL Mouth Rinse BID  . feeding supplement (VITAL HIGH PROTEIN)  1,000 mL Per Tube Q24H  . lidocaine  20 mL Intradermal Once  . losartan  100 mg Oral Once per day on Sun Tue Thu Sat  . magnesium sulfate 1 - 4 g bolus IVPB  1 g Intravenous Once  . mouth rinse  15 mL Mouth Rinse Q2H  . metoprolol  50 mg Oral 2 times per day on Sun Tue Thu Sat  . pantoprazole (PROTONIX) IV  40 mg Intravenous Q12H  . sertraline  100 mg Oral QHS  . sodium chloride flush  10-40 mL Intracatheter Q12H  . sodium chloride flush  3 mL Intravenous Q12H  . vancomycin  750 mg Intravenous Q M,W,F-HD   acetaminophen **OR** acetaminophen, bisacodyl, fentaNYL, ipratropium-albuterol, metoprolol, midazolam, midazolam, senna-docusate, sodium chloride flush  Assessment/ Plan:  Ms. Taralee Marcus is a 60 y.o. 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  1. ESRD on HD MWF: Emergent HD on Sunday. 1100 cc fluid removed Now has rt fem temp dialysis cathter Re-evaluate for HD tomorrow  2. Bacteremia/sepsis: 12/9 gram positive bacilli from [place]. Permcath removed. Blood cultures and cath tip cultures are no growth - empiric cefepime and vanco Likely source pneumonia   3. Anemia of chronic kidney disease: hemoglobin 6.1 - Avoid Epogen given recent diagnosis of breast cancer. - Appreciate oncology input.  - one unit blood transfusion planned  4. Secondary hyperparathyroidism. -  hold cinacalcet and sevelamer for now     LOS: 3 Aymee Fomby 12/11/20179:23 AM

## 2015-12-18 NOTE — Progress Notes (Signed)
Patient extubated per Dr. Tyrone Sage request. Patient placed on 4l [place] post extubation with sats of 94-95%. Patient tolerating well at this time, will continue to monitor.

## 2015-12-18 NOTE — Progress Notes (Signed)
Made MD Mungal aware that patient was having episodes of tachycardia and A.Fib, Mungal stated to administer ordered PRN metoprolol.  Will continue to monitor patient.

## 2015-12-18 NOTE — Progress Notes (Signed)
Placed pt into PSV 5/5 and decreased FIO2 to 40%, tol well at this time. Will continue to monitor.

## 2015-12-18 NOTE — Progress Notes (Addendum)
Pharmacy Antibiotic Note  Teresa Cook is a 60 y.o. female admitted on 12/15/2015 with sepsis, being treated for line infection. Pharmacy has been consulted for vancomycin and cefepime dosing.  Plan: 1. Cefepime 1g IV Q24hr.  2. Vancomycin 750mg  to be given at the conclusion of each dialysis session. Patient had dialysis on 12/10 after transfer to ICU. Patient did not receive vancomycin at that time. Will order vancomycin 750mg  IV x 1 today. Will monitor for next scheduled dialysis. Will obtain trough prior to dialysis on 12/15.   Height: 5\' 6"  (167.6 cm) Weight: 167 lb 12.3 oz (76.1 kg) (ACCURACY OF BS QUESTIONABLE) IBW/kg (Calculated) : 59.3  Temp (24hrs), Avg:98.4 F (36.9 C), Min:97.6 F (36.4 C), Max:99.1 F (37.3 C)   Recent Labs Lab 12/15/15 1952 12/16/15 0227 12/17/15 0256 12/17/15 1300 12/17/15 1820 12/18/15 0502  WBC 4.2 4.2 5.0 10.3  --  8.7  CREATININE 3.36* 4.08*  --  7.10*  --  3.91*  LATICACIDVEN 1.1  --   --  1.7 1.7  --     Estimated Creatinine Clearance: 15.7 mL/min (by C-G formula based on SCr of 3.91 mg/dL (H)).    Allergies  Allergen Reactions  . Adhesive [Tape] Itching    Silk tape is ok to use.    Antimicrobials this admission: Cefepime 12/8 >>  Vancomycin 12/8 >>   Dose adjustments this admission: N/A  Microbiology results: 12/10 BCx: no growth < 24 hours 12/9 Cath Tip Cx: no growth x 2 days  12/8 BCx: no growth x 3  12/8 Urine: no growth  12/8 Sputum: specifmen not acceptable for testing  12/8 MRSA PCR: negative   Pharmacy will continue to monitor and adjust per consult.   Simpson,Michael L 12/18/2015 1:58 PM

## 2015-12-18 NOTE — Progress Notes (Addendum)
PULMONARY / CRITICAL CARE MEDICINE   Name: Teresa Cook MRN: [id number] DOB: 08/10/1955    ADMISSION DATE:  12/15/2015  BRIEF HISTORY: 60 y.o.femalewith a known history of End-stage renal disease on hemodialysis, CHF, DVT of hypertension, breast cancerpresents to the emergency department complaining of cough, congestion and fever on 12/15/15. Patient was in a usual state of health until one week ago when she developed worsening symptoms of chest congestion associated with cough productive of green sputum and fevers to 101 at home. She reports chest tightness and pain associated with coughing. She has been taking Augmentin for the past 3 days but has been worsening..  Patient finished chemotherapy in November 2017 and is planned for surgery in January 2018. Patient has dialysis Monday Wednesday and Friday. Admitted by hospitalist service, had suspicion of line infection (either RIJ per cath, or Left port-a-cath), 12/9 had permcath removed, on 12/10 To have high blood pressure, systolic in the 956O, complained about difficulty breathing, evaluated by nursing staff found to be in respiratory distress oxygen saturation initially was 90% with acutely dropped to 60s, rapid response was called, primary care team was also there, Lasix 40 mg as given per physician order, patient continued to be in respiratory distress she was transferred to the ICU, where she was noted to have white foam and frothing at the mouth at times, she was emergently intubated and placed on mechanical ventilation. Off note, she was scheduled to have her chemotherapy port removed on Monday, 12/18/2015.  SUBJECTIVE:  Elevated BP this am, now extubated this AM, doing well, having intermittent episodes of tachycardia   SIGNIFICANT EVENTS: 12/8> admitted for suspected line infection (either RIJ per cath, or Left port-a-cath) 12/9>R permcath removed 12/10> acute hypoxic respiratory failure, flash pulmonary edema, HTN, hypoxia,  intubated.  12/11> extubated  VITAL SIGNS: Temp:  [97.6 F (36.4 C)-99.1 F (37.3 C)] 98.6 F (37 C) (12/11 0800) Pulse Rate:  [69-115] 94 (12/11 0800) Resp:  [0-39] 23 (12/11 0800) BP: (60-154)/(45-99) 108/73 (12/11 0800) SpO2:  [96 %-100 %] 98 % (12/11 0800) FiO2 (%):  [40 %-60 %] 40 % (12/11 0730) Weight:  [167 lb 12.3 oz (76.1 kg)] 167 lb 12.3 oz (76.1 kg) (12/10 1445) HEMODYNAMICS:   VENTILATOR SETTINGS: Vent Mode: PSV FiO2 (%):  [40 %-60 %] 40 % Set Rate:  [15 bmp] 15 bmp Vt Set:  [500 mL] 500 mL PEEP:  [5 cmH20-8 cmH20] 5 cmH20 Plateau Pressure:  [19 cmH20] 19 cmH20 INTAKE / OUTPUT:  Intake/Output Summary (Last 24 hours) at 12/18/15 1201 Last data filed at 12/18/15 1308  Gross per 24 hour  Intake          1006.84 ml  Output             1154 ml  Net          -147.16 ml    Review of Systems  Constitutional: Negative for chills and fever.  HENT: Positive for sore throat.   Eyes: Negative for blurred vision.  Respiratory: Positive for cough. Negative for sputum production.   Cardiovascular: Negative for chest pain.  Gastrointestinal: Negative for heartburn and nausea.  Genitourinary: Negative for dysuria.  Musculoskeletal: Negative for myalgias.  Neurological: Negative for dizziness.  Endo/Heme/Allergies: Does not bruise/bleed easily.    Physical Exam  Constitutional: She is oriented to person, place, and time and well-developed, well-nourished, and in no distress. No distress.  Eyes: Conjunctivae and EOM are normal. Pupils are equal, round, and reactive to light.  Neck: Normal  range of motion. Neck supple.  Cardiovascular:  Regular, tachycardia  Pulmonary/Chest: No respiratory distress. She has no wheezes.  Coarse upper airway sounds  Abdominal: Soft. Bowel sounds are normal. She exhibits no distension. There is no tenderness.  Musculoskeletal: Normal range of motion.  Neurological: She is alert and oriented to person, place, and time.  Skin: Skin is warm  and dry. She is not diaphoretic.  Nursing note and vitals reviewed.    LABS:  CBC  Recent Labs Lab 12/17/15 0256 12/17/15 1300 12/18/15 0502  WBC 5.0 10.3 8.7  HGB 7.3* 7.7* 6.1*  HCT 21.7* 23.1* 18.3*  PLT 142* 178 119*   Coag's  Recent Labs Lab 12/16/15 0227 12/17/15 0256 12/17/15 1300 12/18/15 0502  APTT  --   --  35  --   INR 1.85 1.78  --  2.91   BMET  Recent Labs Lab 12/16/15 0227 12/17/15 1300 12/18/15 0502  NA 143 137 137  K 3.3* 4.2 3.5  CL 112* 105 101  CO2 24 23 29   BUN 13 26* 21*  CREATININE 4.08* 7.10* 3.91*  GLUCOSE 98 153* 108*   Electrolytes  Recent Labs Lab 12/16/15 0227 12/17/15 1300 12/17/15 1521 12/18/15 0502  CALCIUM 9.7 8.0*  --  8.8*  MG  --  1.9 1.8 1.7  PHOS  --  5.1* 2.9 3.4   Sepsis Markers  Recent Labs Lab 12/15/15 1952 12/17/15 1300 12/17/15 1820 12/18/15 0502  LATICACIDVEN 1.1 1.7 1.7  --   PROCALCITON  --  1.58  --  64.24   ABG  Recent Labs Lab 12/17/15 1200 12/18/15 0500  PHART 7.32* 7.54*  PCO2ART 43 35  PO2ART 165* 126*   Liver Enzymes  Recent Labs Lab 12/15/15 1952 12/18/15 0502  AST 27 23  ALT 20 14  ALKPHOS 295* 171*  BILITOT 0.4 0.7  ALBUMIN 3.1* 2.6*   Cardiac Enzymes  Recent Labs Lab 12/17/15 1300 12/17/15 1820 12/18/15 0157  TROPONINI 0.06* 0.11* 0.15*   Glucose  Recent Labs Lab 12/17/15 1547 12/17/15 2008 12/18/15 0005 12/18/15 0421 12/18/15 0739 12/18/15 1150  GLUCAP 106* 110* 138* 115* 94 88    Imaging Portable Chest Xray  Result Date: 12/18/2015 CLINICAL DATA:  Respiratory failure, sepsis, chronic CHF EXAM: PORTABLE CHEST 1 VIEW COMPARISON:  Chest x-ray of December 17, 2015 FINDINGS: The lungs are well-expanded. There is persistent infiltrate in the right upper lobe which has decreased slightly. Infiltrate in the left CT perihilar region has also improved. The cardiac silhouette is top-normal in size. The pulmonary vascularity is not clearly engorged. There  is calcification in the wall of the aortic arch. The endotracheal tube tip lies 3.7 cm above the carina. The esophagogastric tube tip projects below the inferior margin of the image. The porta catheter tip projects at the cavoatrial junction. There is chronic abnormality of the posterior aspect of the right eighth rib. IMPRESSION: Slight interval improvement in the alveolar opacities bilaterally. Pneumonia is favored though asymmetric pulmonary edema could present similarly. The support tubes are in reasonable position. Thoracic aortic atherosclerosis. Electronically Signed   By: David  Martinique M.D.   On: 12/18/2015 07:17   Cultures: BCx2 12/8 UC  Sputum Permcath tip culture 12/9 Port-a-cath culture 12/10  Antibiotics: Vanc 12/8> Cefepime 12/8>  Lines: Vascath Fem CVL Fem  DISCUSSION: 60 year old female past medical history of end-stage renal disease on hemodialysis, CHF, DVT, hypertension, breast cancer, last chemotherapy treatment was in November, initially presented on 12/8 with complaint of coughing, congestion  and fever, admitted by hospitalist team for suspected sepsis from underlying line infection. On 12/10, noted to be in severe respiratory distress with flash pulmonary edema and hypertension, emergently intubated and transferred to the ICU. Extubated on 12/11, doing well from a respiratory standpoint.   ASSESSMENT / PLAN: PULMONARY Acute hypoxic respiratory failure-resolved Flash pulmonary edema- improving Right greater than left lung infiltrates, most likely fluid- now improving P:   Extubated 12/11 Keep O2 sats greater than 88% CXR today reviewed - already with clearing of right sided infiltrates - most likely asymmetric edema from flash pulmonary edema, especially given rapid clearing of infiltrates in 1 day.  HD performed yesterday (1L removed) Would cont with above abx for suspected line/port infection.   CARDIOVASCULAR A: HTN urgency-resolving Mild troponin  elevation P:  Will cont with BP meds Mild troponin elevation - possibly demand ischemia Can restart PO HTN meds, slowly  RENAL ESRD on HD P:   RIJ permcath removed due to suspicion on line infection on 12/9 No HD today per nephro Nephro following  GASTROINTESTINAL PUD  HEMATOLOGIC Hx of Breast Ca Hx of Left Leg DVT on warfarin P:  Now s/p chemo in November Spoke with Heme/onc  - no further chemo planned, okay to remove port Stop warfarin, will start hep gtt - can resume after removal of port-a-cath Monitor INR   INFECTIOUS Sepsis - 2/2 to possible port or permcath infection P:   -cont with above abx -f\u on cultures -per Dr. Lorenso Courier, obtain a culture from left port-a-cath prior to removal, permath on R removed 12/9 PCT 1.58>64.24  ENDOCRINE ICU hypo/hyperglycemia protocol  NEUROLOGIC No issues   Spoke with Dr. Max Sane, will transfer to hospitalist service on 12/12 AM.    I have personally obtained a history, examined the patient, evaluated laboratory and imaging results, formulated the assessment and plan and placed orders.  Critical Care Time devoted to patient care services described in this note is 45 minutes.   Vilinda Boehringer, MD [facility] Pager [phone] (please enter 7-digits) On Call Pager - [phone] (please enter 7-digits   Note: This note was prepared with Dragon dictation along with smaller phrase technology. Any transcriptional errors that result from this process are unintentional.

## 2015-12-18 NOTE — Consult Note (Signed)
[facility] Infectious Disease     Reason for Consult: Sepsis    Referring Physician:  Date of Admission:  12/15/2015   Active Problems:   Sepsis [facility])   HPI: Teresa Cook is a 60 y.o. female admitted with cough, fevers, for one week.  Previously on augmentin prior to admission.  On admit she had 100.2 and wbc 4. She has breast cancer and has been finishing chemo. Also ESRD and had a temp cath in place as wel as portacath. Initially cxr neg for pna but then fu 12/9 with possible infiltrate and worsening resp status after removing her R chest HD cath.  After admit it was found that otpt cx + GPC from 12/9.  She had removal of both her HD cath and portacath.  Has a new Fem Temp cath in place.  Was initially intubated but following HD and removal of fluid, now extubated and doing better.    Past Medical History:  Diagnosis Date  . Anemia   . Anxiety   . Breast cancer ([place])   . CHF (congestive heart failure) ([place])   . Chronic kidney disease   . Depression   . Dialysis patient ([place])   . DVT (deep venous thrombosis) ([facility])    left leg  . DVT (deep venous thrombosis) ([place])   . Gout   . HTN (hypertension)   . Hypertension   . Parathyroid abnormality ([place])   . Parathyroid disease ([place])   . Psoriasis   . Renal insufficiency   . Sickle cell anemia (H[facility])    traits   Past Surgical History:  Procedure Laterality Date  . ABDOMINAL HYSTERECTOMY    . APPENDECTOMY    . BREAST BIOPSY Left 10/28/2013   benign  . BREAST EXCISIONAL BIOPSY Left 2002   benign  . INSERTION OF DIALYSIS CATHETER  2014  . PARTIAL HYSTERECTOMY    . PORTACATH PLACEMENT Left 08/21/2015   Procedure: INSERTION PORT-A-CATH;  Surgeon: Hubbard Robinson, MD;  Location: [facility] ORS;  Service: General;  Laterality: Left;   Social History  Substance Use Topics  . Smoking status: Never Smoker  . Smokeless tobacco: Never Used  . Alcohol use No   Family History  Problem Relation Age of Onset  . Stroke Mother   .  CVA Mother   . Hypertension Mother   . Hypertension Father   . Hypertension Sister   . Diabetes Sister   . Cancer Sister 40    Breast  . Stroke Brother   . Hypertension Brother   . Breast cancer Maternal Aunt 70    Allergies:  Allergies  Allergen Reactions  . Adhesive [Tape] Itching    Silk tape is ok to use.    Current antibiotics: Antibiotics Given (last 72 hours)    Date/Time Action Medication Dose Rate   12/15/15 2317 Given   vancomycin (VANCOCIN) 500 mg in sodium chloride 0.9 % 100 mL IVPB 500 mg 100 mL/hr   12/16/15 1717 Given   ceFEPIme (MAXIPIME) 1 GM / 71m IVPB premix 1 g 100 mL/hr   12/17/15 1921 Given   ceFEPIme (MAXIPIME) 1 GM / 536mIVPB premix 1 g 100 mL/hr      MEDICATIONS: . sodium chloride   Intravenous Once  . aspirin  81 mg Oral Daily  . ceFEPIme  1 g Intravenous Daily  . chlorhexidine gluconate (MEDLINE KIT)  15 mL Mouth Rinse BID  . feeding supplement (VITAL HIGH PROTEIN)  1,000 mL Per Tube Q24H  . lidocaine  20 mL Intradermal Once  . losartan  100 mg Oral Once per day on Sun Tue Thu Sat  . mouth rinse  15 mL Mouth Rinse Q2H  . metoprolol  50 mg Oral 2 times per day on Sun Tue Thu Sat  . pantoprazole (PROTONIX) IV  40 mg Intravenous Q12H  . sertraline  50 mg Oral QHS  . sodium chloride flush  10-40 mL Intracatheter Q12H  . sodium chloride flush  3 mL Intravenous Q12H  . vancomycin  750 mg Intravenous Q M,W,F-HD    Review of Systems - 11 systems reviewed and negative per HPI   OBJECTIVE: Temp:  [97.6 F (36.4 C)-99.1 F (37.3 C)] 98.6 F (37 C) (12/11 0800) Pulse Rate:  [69-115] 94 (12/11 0800) Resp:  [0-25] 23 (12/11 0800) BP: (60-128)/(45-89) 108/73 (12/11 0800) SpO2:  [96 %-100 %] 98 % (12/11 0800) FiO2 (%):  [40 %-50 %] 40 % (12/11 0730) Physical Exam  Constitutional:  oriented to person, place, and time. Frail, thin  HENT: [place]/AT, PERRLA, no scleral icterus Mouth/Throat: hoarse  Oropharynx is clear and dry. No oropharyngeal  exudate.  Cardiovascular: Normal rate, regular rhythm and normal heart sounds.  Pulmonary/Chest: bil rhonchi, bronchial bs Neck = supple, no nuchal rigidity Portacath site wnl Abdominal: Soft. Bowel sounds are normal.  exhibits no distension. There is no tenderness.  Lymphadenopathy: no cervical adenopathy. No axillary adenopathy Neurological: alert and oriented to person, place, and time.  Skin: Skin is warm and dry. No rash noted. No erythema.  Psychiatric: a normal mood and affect.  behavior is normal.   LABS: Results for orders placed or performed during the hospital encounter of 12/15/15 (from the past 48 hour(s))  Cath Tip Culture     Status: None (Preliminary result)   Collection Time: 12/16/15  3:30 PM  Result Value Ref Range   Specimen Description HEMODIALYSIS CATHETER    Special Requests NONE    Culture      NO GROWTH 2 DAYS Performed at [facility]    Report Status PENDING   Glucose, capillary     Status: Abnormal   Collection Time: 12/17/15  2:23 AM  Result Value Ref Range   Glucose-Capillary 105 (H) 65 - 99 mg/dL   Comment 1 Notify RN   Protime-INR     Status: Abnormal   Collection Time: 12/17/15  2:56 AM  Result Value Ref Range   Prothrombin Time 20.9 (H) 11.4 - 15.2 seconds   INR 1.78   CBC     Status: Abnormal   Collection Time: 12/17/15  2:56 AM  Result Value Ref Range   WBC 5.0 3.6 - 11.0 K/uL   RBC 2.51 (L) 3.80 - 5.20 MIL/uL   Hemoglobin 7.3 (L) 12.0 - 16.0 g/dL   HCT 21.7 (L) 35.0 - 47.0 %   MCV 86.5 80.0 - 100.0 fL   MCH 29.0 26.0 - 34.0 pg   MCHC 33.5 32.0 - 36.0 g/dL   RDW 17.9 (H) 11.5 - 14.5 %   Platelets 142 (L) 150 - 440 K/uL  MRSA PCR Screening     Status: None   Collection Time: 12/17/15 11:32 AM  Result Value Ref Range   MRSA by PCR NEGATIVE NEGATIVE    Comment:        The GeneXpert MRSA Assay (FDA approved for NASAL specimens only), is one component of a comprehensive MRSA colonization surveillance program. It is  not intended to diagnose MRSA infection nor to guide or monitor  treatment for MRSA infections.   Draw ABG 1 hour after initiation of ventilator     Status: Abnormal   Collection Time: 12/17/15 12:00 PM  Result Value Ref Range   FIO2 1.00    Delivery systems VENTILATOR    Mode PRESSURE REGULATED VOLUME CONTROL    VT 500 mL   LHR 15 resp/min   Peep/cpap 8.0 cm H20   pH, Arterial 7.32 (L) 7.350 - 7.450   pCO2 arterial 43 32.0 - 48.0 mmHg   pO2, Arterial 165 (H) 83.0 - 108.0 mmHg   Bicarbonate 22.2 20.0 - 28.0 mmol/L   Acid-base deficit 3.7 (H) 0.0 - 2.0 mmol/L   O2 Saturation 99.4 %   Patient temperature 37.0    Collection site LEFT RADIAL    Sample type ARTERIAL DRAW    Allens test (pass/fail) PASS PASS  Basic metabolic panel     Status: Abnormal   Collection Time: 12/17/15  1:00 PM  Result Value Ref Range   Sodium 137 135 - 145 mmol/L   Potassium 4.2 3.5 - 5.1 mmol/L   Chloride 105 101 - 111 mmol/L   CO2 23 22 - 32 mmol/L   Glucose, Bld 153 (H) 65 - 99 mg/dL   BUN 26 (H) 6 - 20 mg/dL   Creatinine, Ser 7.10 (H) 0.44 - 1.00 mg/dL   Calcium 8.0 (L) 8.9 - 10.3 mg/dL   GFR calc non Af Amer 6 (L) >60 mL/min   GFR calc Af Amer 6 (L) >60 mL/min    Comment: (NOTE) The eGFR has been calculated using the CKD EPI equation. This calculation has not been validated in all clinical situations. eGFR's persistently <60 mL/min signify possible Chronic Kidney Disease.    Anion gap 9 5 - 15  CBC with Differential/Platelet     Status: Abnormal   Collection Time: 12/17/15  1:00 PM  Result Value Ref Range   WBC 10.3 3.6 - 11.0 K/uL   RBC 2.71 (L) 3.80 - 5.20 MIL/uL   Hemoglobin 7.7 (L) 12.0 - 16.0 g/dL   HCT 23.1 (L) 35.0 - 47.0 %   MCV 85.3 80.0 - 100.0 fL   MCH 28.4 26.0 - 34.0 pg   MCHC 33.3 32.0 - 36.0 g/dL   RDW 18.0 (H) 11.5 - 14.5 %   Platelets 178 150 - 440 K/uL   Neutrophils Relative % 86 %   Neutro Abs 8.9 (H) 1.4 - 6.5 K/uL   Lymphocytes Relative 8 %   Lymphs Abs 0.8 (L)  1.0 - 3.6 K/uL   Monocytes Relative 5 %   Monocytes Absolute 0.5 0.2 - 0.9 K/uL   Eosinophils Relative 0 %   Eosinophils Absolute 0.0 0 - 0.7 K/uL   Basophils Relative 1 %   Basophils Absolute 0.1 0 - 0.1 K/uL  Procalcitonin - Baseline     Status: None   Collection Time: 12/17/15  1:00 PM  Result Value Ref Range   Procalcitonin 1.58 ng/mL    Comment:        Interpretation: PCT > 0.5 ng/mL and <= 2 ng/mL: Systemic infection (sepsis) is possible, but other conditions are known to elevate PCT as well. (NOTE)         ICU PCT Algorithm               Non ICU PCT Algorithm    ----------------------------     ------------------------------         PCT < 0.25 ng/mL  PCT < 0.1 ng/mL     Stopping of antibiotics            Stopping of antibiotics       strongly encouraged.               strongly encouraged.    ----------------------------     ------------------------------       PCT level decrease by               PCT < 0.25 ng/mL       >= 80% from peak PCT       OR PCT 0.25 - 0.5 ng/mL          Stopping of antibiotics                                             encouraged.     Stopping of antibiotics           encouraged.    ----------------------------     ------------------------------       PCT level decrease by              PCT >= 0.25 ng/mL       < 80% from peak PCT        AND PCT >= 0.5 ng/mL             Continuing antibiotics                                              encouraged.       Continuing antibiotics            encouraged.    ----------------------------     ------------------------------     PCT level increase compared          PCT > 0.5 ng/mL         with peak PCT AND          PCT >= 0.5 ng/mL             Escalation of antibiotics                                          strongly encouraged.      Escalation of antibiotics        strongly encouraged.   Lactic acid, plasma     Status: None   Collection Time: 12/17/15  1:00 PM  Result Value Ref  Range   Lactic Acid, Venous 1.7 0.5 - 1.9 mmol/L  APTT     Status: None   Collection Time: 12/17/15  1:00 PM  Result Value Ref Range   aPTT 35 24 - 36 seconds  Magnesium     Status: None   Collection Time: 12/17/15  1:00 PM  Result Value Ref Range   Magnesium 1.9 1.7 - 2.4 mg/dL  Phosphorus     Status: Abnormal   Collection Time: 12/17/15  1:00 PM  Result Value Ref Range   Phosphorus 5.1 (H) 2.5 - 4.6 mg/dL  Troponin I     Status: Abnormal   Collection Time: 12/17/15  1:00 PM  Result Value Ref Range  Troponin I 0.06 (HH) <0.03 ng/mL    Comment: CRITICAL RESULT CALLED TO, READ BACK BY AND VERIFIED WITH SABRINA ELLIOT @ 1342 12/17/15 BY [facility]   Hepatitis B surface antigen     Status: None   Collection Time: 12/17/15  3:01 PM  Result Value Ref Range   Hepatitis B Surface Ag Negative Negative    Comment: (NOTE) Performed At: [facility] [place], [place] [id number] Lindon Romp MD [id number]   Magnesium     Status: None   Collection Time: 12/17/15  3:21 PM  Result Value Ref Range   Magnesium 1.8 1.7 - 2.4 mg/dL  Phosphorus     Status: None   Collection Time: 12/17/15  3:21 PM  Result Value Ref Range   Phosphorus 2.9 2.5 - 4.6 mg/dL  Hepatitis B surface antibody     Status: None   Collection Time: 12/17/15  3:21 PM  Result Value Ref Range   Hep B S Ab Reactive     Comment: (NOTE)              Non Reactive: Inconsistent with immunity,                            less than 10 mIU/mL              Reactive:     Consistent with immunity,                            greater than 9.9 mIU/mL Performed At: [facility] [place], [place] [id number] Lindon Romp MD [id number]   Hepatitis B core antibody, total     Status: None   Collection Time: 12/17/15  3:21 PM  Result Value Ref Range   Hep B Core Total Ab Negative Negative    Comment: (NOTE) Performed At: [facility] [address] [place], [place]  [id number] Lindon Romp MD ER:[id number]   Glucose, capillary     Status: Abnormal   Collection Time: 12/17/15  3:47 PM  Result Value Ref Range   Glucose-Capillary 106 (H) 65 - 99 mg/dL  Culture, blood (single) w Reflex to ID Panel     Status: None (Preliminary result)   Collection Time: 12/17/15  5:30 PM  Result Value Ref Range   Specimen Description PORTA CATH    Special Requests BOTTLES DRAWN AEROBIC AND ANAEROBIC 5ML    Culture NO GROWTH < 24 HOURS    Report Status PENDING   Lactic acid, plasma     Status: None   Collection Time: 12/17/15  6:20 PM  Result Value Ref Range   Lactic Acid, Venous 1.7 0.5 - 1.9 mmol/L  Troponin I (q 6hr x 3)     Status: Abnormal   Collection Time: 12/17/15  6:20 PM  Result Value Ref Range   Troponin I 0.11 (HH) <0.03 ng/mL    Comment: CRITICAL VALUE NOTED. VALUE IS CONSISTENT WITH PREVIOUSLY REPORTED/CALLED VALUE.PMH  Glucose, capillary     Status: Abnormal   Collection Time: 12/17/15  8:08 PM  Result Value Ref Range   Glucose-Capillary 110 (H) 65 - 99 mg/dL  Heparin level (unfractionated)     Status: Abnormal   Collection Time: 12/17/15 10:16 PM  Result Value Ref Range   Heparin Unfractionated 0.97 (H) 0.30 - 0.70 IU/mL    Comment:  IF HEPARIN RESULTS ARE BELOW EXPECTED VALUES, AND PATIENT DOSAGE HAS BEEN CONFIRMED, SUGGEST FOLLOW UP TESTING OF ANTITHROMBIN III LEVELS.   Glucose, capillary     Status: Abnormal   Collection Time: 12/18/15 12:05 AM  Result Value Ref Range   Glucose-Capillary 138 (H) 65 - 99 mg/dL  Troponin I (q 6hr x 3)     Status: Abnormal   Collection Time: 12/18/15  1:57 AM  Result Value Ref Range   Troponin I 0.15 (HH) <0.03 ng/mL    Comment: CRITICAL VALUE NOTED. VALUE IS CONSISTENT WITH PREVIOUSLY REPORTED/CALLED VALUE RWW   Glucose, capillary     Status: Abnormal   Collection Time: 12/18/15  4:21 AM  Result Value Ref Range   Glucose-Capillary 115 (H) 65 - 99 mg/dL  Blood gas, arterial     Status:  Abnormal   Collection Time: 12/18/15  5:00 AM  Result Value Ref Range   FIO2 0.50    Mode PRESSURE REGULATED VOLUME CONTROL    VT 500 mL   Peep/cpap 8.0 cm H20   pH, Arterial 7.54 (H) 7.350 - 7.450   pCO2 arterial 35 32.0 - 48.0 mmHg   pO2, Arterial 126 (H) 83.0 - 108.0 mmHg   Bicarbonate 29.9 (H) 20.0 - 28.0 mmol/L   Acid-Base Excess 7.2 (H) 0.0 - 2.0 mmol/L   O2 Saturation 99.2 %   Patient temperature 37.0    Collection site RIGHT RADIAL    Sample type ARTERIAL DRAW    Allens test (pass/fail) PASS PASS   Mechanical Rate 15   Protime-INR     Status: Abnormal   Collection Time: 12/18/15  5:02 AM  Result Value Ref Range   Prothrombin Time 31.0 (H) 11.4 - 15.2 seconds   INR 2.91   Comprehensive metabolic panel     Status: Abnormal   Collection Time: 12/18/15  5:02 AM  Result Value Ref Range   Sodium 137 135 - 145 mmol/L   Potassium 3.5 3.5 - 5.1 mmol/L   Chloride 101 101 - 111 mmol/L   CO2 29 22 - 32 mmol/L   Glucose, Bld 108 (H) 65 - 99 mg/dL   BUN 21 (H) 6 - 20 mg/dL   Creatinine, Ser 3.91 (H) 0.44 - 1.00 mg/dL   Calcium 8.8 (L) 8.9 - 10.3 mg/dL   Total Protein 5.3 (L) 6.5 - 8.1 g/dL   Albumin 2.6 (L) 3.5 - 5.0 g/dL   AST 23 15 - 41 U/L   ALT 14 14 - 54 U/L   Alkaline Phosphatase 171 (H) 38 - 126 U/L   Total Bilirubin 0.7 0.3 - 1.2 mg/dL   GFR calc non Af Amer 11 (L) >60 mL/min   GFR calc Af Amer 13 (L) >60 mL/min    Comment: (NOTE) The eGFR has been calculated using the CKD EPI equation. This calculation has not been validated in all clinical situations. eGFR's persistently <60 mL/min signify possible Chronic Kidney Disease.    Anion gap 7 5 - 15  CBC     Status: Abnormal   Collection Time: 12/18/15  5:02 AM  Result Value Ref Range   WBC 8.7 3.6 - 11.0 K/uL   RBC 2.13 (L) 3.80 - 5.20 MIL/uL   Hemoglobin 6.1 (L) 12.0 - 16.0 g/dL   HCT 18.3 (L) 35.0 - 47.0 %   MCV 85.5 80.0 - 100.0 fL   MCH 28.5 26.0 - 34.0 pg   MCHC 33.3 32.0 - 36.0 g/dL   RDW 17.8 (H) 11.5  -  14.5 %   Platelets 119 (L) 150 - 440 K/uL  Procalcitonin     Status: None   Collection Time: 12/18/15  5:02 AM  Result Value Ref Range   Procalcitonin 64.24 ng/mL    Comment:        Interpretation: PCT >= 10 ng/mL: Important systemic inflammatory response, almost exclusively due to severe bacterial sepsis or septic shock. (NOTE)         ICU PCT Algorithm               Non ICU PCT Algorithm    ----------------------------     ------------------------------         PCT < 0.25 ng/mL                 PCT < 0.1 ng/mL     Stopping of antibiotics            Stopping of antibiotics       strongly encouraged.               strongly encouraged.    ----------------------------     ------------------------------       PCT level decrease by               PCT < 0.25 ng/mL       >= 80% from peak PCT       OR PCT 0.25 - 0.5 ng/mL          Stopping of antibiotics                                             encouraged.     Stopping of antibiotics           encouraged.    ----------------------------     ------------------------------       PCT level decrease by              PCT >= 0.25 ng/mL       < 80% from peak PCT        AND PCT >= 0.5 ng/mL             Continuing antibiotics                                              encouraged.       Continuing antibiotics            encouraged.    ----------------------------     ------------------------------     PCT level increase compared          PCT > 0.5 ng/mL         with peak PCT AND          PCT >= 0.5 ng/mL             Escalation of antibiotics                                          strongly encouraged.      Escalation of antibiotics        strongly encouraged.   Magnesium     Status: None   Collection Time:  12/18/15  5:02 AM  Result Value Ref Range   Magnesium 1.7 1.7 - 2.4 mg/dL  Phosphorus     Status: None   Collection Time: 12/18/15  5:02 AM  Result Value Ref Range   Phosphorus 3.4 2.5 - 4.6 mg/dL  Heparin level  (unfractionated)     Status: Abnormal   Collection Time: 12/18/15  5:02 AM  Result Value Ref Range   Heparin Unfractionated 0.75 (H) 0.30 - 0.70 IU/mL    Comment:        IF HEPARIN RESULTS ARE BELOW EXPECTED VALUES, AND PATIENT DOSAGE HAS BEEN CONFIRMED, SUGGEST FOLLOW UP TESTING OF ANTITHROMBIN III LEVELS.   Glucose, capillary     Status: None   Collection Time: 12/18/15  7:39 AM  Result Value Ref Range   Glucose-Capillary 94 65 - 99 mg/dL  Type and screen [place]     Status: None (Preliminary result)   Collection Time: 12/18/15  9:12 AM  Result Value Ref Range   ABO/RH(D) O POS    Antibody Screen POS    Sample Expiration 12/21/2015    Antibody Identification ANTI K NON SPECIFIC ANTIBODY REACTIVITY    Unit Number [id number]    Blood Component Type RBC LR PHER1    Unit division 00    Status of Unit ALLOCATED    Transfusion Status OK TO TRANSFUSE    Crossmatch Result COMPATIBLE    Unit Number [id number]    Blood Component Type RBC LR PHER1    Unit division 00    Status of Unit ALLOCATED    Transfusion Status OK TO TRANSFUSE    Crossmatch Result COMPATIBLE   Prepare RBC     Status: None   Collection Time: 12/18/15  9:42 AM  Result Value Ref Range   Order Confirmation ORDER PROCESSED BY BLOOD BANK   Glucose, capillary     Status: None   Collection Time: 12/18/15 11:50 AM  Result Value Ref Range   Glucose-Capillary 88 65 - 99 mg/dL   No components found for: ESR, C REACTIVE PROTEIN MICRO: Recent Results (from the past 720 hour(s))  Blood Culture (routine x 2)     Status: None (Preliminary result)   Collection Time: 12/15/15  7:52 PM  Result Value Ref Range Status   Specimen Description BLOOD LEFT ASSIST CONTROL  Final   Special Requests   Final    BOTTLES DRAWN AEROBIC AND ANAEROBIC 12CCAERO,14CCANA   Culture NO GROWTH 3 DAYS  Final   Report Status PENDING  Incomplete  Blood Culture (routine x 2)     Status: None (Preliminary result)    Collection Time: 12/15/15  7:53 PM  Result Value Ref Range Status   Specimen Description BLOOD L HAND  Final   Special Requests   Final    BOTTLES DRAWN AEROBIC AND ANAEROBIC AER 11CC, ANA 10CC   Culture NO GROWTH 3 DAYS  Final   Report Status PENDING  Incomplete  Urine culture     Status: None   Collection Time: 12/15/15  8:42 PM  Result Value Ref Range Status   Specimen Description URINE, RANDOM  Final   Special Requests NONE  Final   Culture NO GROWTH Performed at [facility]   Final   Report Status 12/17/2015 FINAL  Final  Culture, sputum-assessment     Status: None   Collection Time: 12/15/15 11:31 PM  Result Value Ref Range Status   Specimen Description EXPECTORATED SPUTUM  Final   Special Requests  Immunocompromised  Final   Sputum evaluation   Final    Sputum specimen not acceptable for testing.  Please recollect.   [name] AT 1610 12/16/15.PMH   Report Status 12/16/2015 FINAL  Final  MRSA PCR Screening     Status: None   Collection Time: 12/15/15 11:31 PM  Result Value Ref Range Status   MRSA by PCR NEGATIVE NEGATIVE Final    Comment:        The GeneXpert MRSA Assay (FDA approved for NASAL specimens only), is one component of a comprehensive MRSA colonization surveillance program. It is not intended to diagnose MRSA infection nor to guide or monitor treatment for MRSA infections.   Cath Tip Culture     Status: None (Preliminary result)   Collection Time: 12/16/15  3:30 PM  Result Value Ref Range Status   Specimen Description HEMODIALYSIS CATHETER  Final   Special Requests NONE  Final   Culture   Final    NO GROWTH 2 DAYS Performed at [facility]    Report Status PENDING  Incomplete  MRSA PCR Screening     Status: None   Collection Time: 12/17/15 11:32 AM  Result Value Ref Range Status   MRSA by PCR NEGATIVE NEGATIVE Final    Comment:        The GeneXpert MRSA Assay (FDA approved for NASAL specimens only), is one component of  a comprehensive MRSA colonization surveillance program. It is not intended to diagnose MRSA infection nor to guide or monitor treatment for MRSA infections.   Culture, blood (single) w Reflex to ID Panel     Status: None (Preliminary result)   Collection Time: 12/17/15  5:30 PM  Result Value Ref Range Status   Specimen Description PORTA CATH  Final   Special Requests BOTTLES DRAWN AEROBIC AND ANAEROBIC 5ML  Final   Culture NO GROWTH < 24 HOURS  Final   Report Status PENDING  Incomplete    IMAGING: Dg Chest 1 View  Result Date: 12/17/2015 CLINICAL DATA:  Respiratory failure, intubation EXAM: CHEST 1 VIEW COMPARISON:  Portable exam 1058 hours compared to 12/16/2015 FINDINGS: Tip of endotracheal tube projects 4.3 cm above carina. Nasogastric tube extends into stomach. RIGHT subclavian Port-A-Cath with tip projecting over SVC. Borderline enlargement of cardiac silhouette. Atherosclerotic calcification aorta. Patchy BILATERAL pulmonary infiltrates with perihilar and upper lobe predominance, question multifocal pneumonia. No pleural effusion or pneumothorax. Bones demineralized. IMPRESSION: New BILATERAL perihilar and upper lobe infiltrates greatest in RIGHT upper lobe question multifocal pneumonia, asymmetric edema less likely. Electronically Signed   By: Lavonia Dana M.D.   On: 12/17/2015 11:33   Dg Chest 1 View  Result Date: 12/16/2015 CLINICAL DATA:  Initial evaluation for possible pneumonia. EXAM: CHEST 1 VIEW COMPARISON:  Prior radiograph from 12/15/2015. FINDINGS: Right-sided pool blooming in hemodialysis catheter in place, stable. Left-sided Port-A-Cath also unchanged. Moderate cardiomegaly. Mediastinal silhouette within normal limits. Prominent atheromatous plaque within the aortic arch. Lungs normally inflated. Mild diffuse vascular congestion without overt pulmonary edema. No new focal infiltrates identified to suggest pneumonia. No pleural effusion. No pneumothorax. Osseous structures  are unchanged. IMPRESSION: 1. No new focal infiltrates to suggest pneumonia identified. 2. Stable cardiomegaly with mild diffuse pulmonary vascular congestion without overt pulmonary edema. Electronically Signed   By: Jeannine Boga M.D.   On: 12/16/2015 04:51   Dg Abd 1 View  Result Date: 12/17/2015 CLINICAL DATA:  Orogastric tube placement EXAM: ABDOMEN - 1 VIEW COMPARISON:  Portable exam 1102 hours without priors  for comparison FINDINGS: Orogastric tube extends into mid stomach. Linear subsegmental atelectasis medial LEFT lower lobe. RIGHT lung base clear. Visualized bowel gas pattern normal. IMPRESSION: Tip of orogastric tube projects over mid stomach. Electronically Signed   By: Lavonia Dana M.D.   On: 12/17/2015 11:34   Portable Chest Xray  Result Date: 12/18/2015 CLINICAL DATA:  Respiratory failure, sepsis, chronic CHF EXAM: PORTABLE CHEST 1 VIEW COMPARISON:  Chest x-ray of December 17, 2015 FINDINGS: The lungs are well-expanded. There is persistent infiltrate in the right upper lobe which has decreased slightly. Infiltrate in the left CT perihilar region has also improved. The cardiac silhouette is top-normal in size. The pulmonary vascularity is not clearly engorged. There is calcification in the wall of the aortic arch. The endotracheal tube tip lies 3.7 cm above the carina. The esophagogastric tube tip projects below the inferior margin of the image. The porta catheter tip projects at the cavoatrial junction. There is chronic abnormality of the posterior aspect of the right eighth rib. IMPRESSION: Slight interval improvement in the alveolar opacities bilaterally. Pneumonia is favored though asymmetric pulmonary edema could present similarly. The support tubes are in reasonable position. Thoracic aortic atherosclerosis. Electronically Signed   By: Jeraldine Primeau  [place] M.D.   On: 12/18/2015 07:17   Dg Chest Port 1 View  Result Date: 12/15/2015 CLINICAL DATA:  Cough and right lower rales.  Fever.  EXAM: PORTABLE CHEST 1 VIEW COMPARISON:  09/22/2015 FINDINGS: Left subclavian porta catheter with tip at the upper cavoatrial junction. Right-sided dialysis catheter with tips at the SVC. There is no edema, consolidation, effusion, or pneumothorax. Borderline cardiomegaly. Stable aortic contours. IMPRESSION: No evidence of acute disease. Electronically Signed   By: Monte Fantasia M.D.   On: 12/15/2015 20:16   Mm Clip Placement Right  Result Date: 12/06/2015 CLINICAL DATA:  Status post MR guided core biopsy of the right breast. EXAM: DIAGNOSTIC RIGHT MAMMOGRAM POST MRI BIOPSY COMPARISON:  Previous exam(s). FINDINGS: Mammographic images were obtained following MR guided biopsy of the right breast. Mammographic images show there is a dumbbell-shaped clip in the 6 o'clock region of the right breast. IMPRESSION: Status post MR guided core biopsy of the right breast with pathology pending. Final Assessment: Post Procedure Mammograms for Marker Placement Electronically Signed   By: Lillia Mountain M.D.   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 This was found to be concordant by Dr. Everlean Alstrom. Pathology results were discussed with the patient by telephone. The patient reported doing well after the biopsy with tenderness at the site. Post biopsy instructions and care were reviewed and questions were answered. The patient was encouraged to call [facility] for any additional concerns. The patient has a recent diagnosis of left breast cancer and should follow her outlined treatment plan. Pathology results reported by Terie Purser, RN on 12/08/2015. Electronically Signed   By: Lillia Mountain M.D.   On: 12/08/2015 14:18   Result  Date: 12/11/2015 CLINICAL DATA:  Biopsy proven left breast cancer. Recent MRI showed abnormal enhancement in the right breast. EXAM: MRI GUIDED CORE NEEDLE BIOPSY OF THE RIGHT BREAST TECHNIQUE: Multiplanar, multisequence MR imaging of the right breast was performed both before and after administration of intravenous contrast. CONTRAST:  90m  MULTIHANCE GADOBENATE DIMEGLUMINE 529 MG/ML IV SOLN COMPARISON:  Previous exams. FINDINGS: I met with the patient, and we discussed the procedure of MRI guided biopsy, including risks, benefits, and alternatives. Specifically, we discussed the risks of infection, bleeding, tissue injury, clip migration, and inadequate sampling. Informed, written consent was given. The usual time out protocol was performed immediately prior to the procedure. Using sterile technique, 1% Lidocaine, MRI guidance, and a 9 gauge vacuum assisted device, biopsy was performed of the right breast using a lateral to medial approach. At the conclusion of the procedure, a dumbbell-shaped tissue marker clip was deployed into the biopsy cavity. Follow-up 2-view mammogram was performed and dictated separately. IMPRESSION: MRI guided biopsy of the right breast. No apparent complications. Electronically Signed: By: Lillia Mountain M.D. On: 12/06/2015 09:47    Assessment:   Emylee Decelle is a 60 y.o. female admitted with cough, ever and weakness as well as otpt HD culture with Corynebacterium.  She has ESRD and is on HD through R chest wall HD cath and has portacath in place. She was on abx at time of admission with augmentin. [place] here negative. No fevers since admit and had acute respiratory decline felt likely due to acute pulmonary edema.  Her HD cath is removed, portacath plans to be removed as she is finished with chemo.  Recommendations Agree with removal of portacath since pt reports some pain at the site and she is no longer going to need chemo Once cath removed can give a total 10 days of abx  Will  review the blood culture from outpt setting but likely can give vanco at HD to cover the diptheroids  Thank you very much for allowing me to participate in the care of this patient. Please call with questions.   Cheral Marker. Ola Spurr, MD

## 2015-12-18 NOTE — Progress Notes (Signed)
Pt AO and has been complaining of pain from ETT tube throughout night. Pt slept on and off and has had periods of anxiety with request for the nurse to hold her hand and sit with her. Pt became hypotensive with MAP in the 50's. Fentanyl decreased and 250 NS bolus given per order. Subsequent BP's improved.

## 2015-12-18 NOTE — Progress Notes (Signed)
CONCERNING: IV to Oral Route Change Policy  RECOMMENDATION: This patient is receiving pantoprazole by the intravenous route.  Based on criteria approved by the Pharmacy and Therapeutics Committee, the intravenous medication(s) is/are being converted to the equivalent oral dose form(s).   DESCRIPTION: These criteria include:  The patient is eating (either orally or via tube) and/or has been taking other orally administered medications for a least 24 hours  The patient has no evidence of active gastrointestinal bleeding or impaired GI absorption (gastrectomy, short bowel, patient on TNA or NPO).  If you have questions about this conversion, please contact the Pharmacy Department  []   [phone] )  [facility] [x]   [phone] )  [facility] []   [phone] )  [facility] []   [phone] )  [facility] []   [phone] )  [place], [facility] 12/18/2015 5:11 PM

## 2015-12-18 NOTE — Progress Notes (Signed)
Nutrition Follow-up  DOCUMENTATION CODES:   Not applicable  INTERVENTION:  Provide Boost Breeze po TID, each supplement provides 250 kcal and 9 grams of protein. Patient may need Nepro and/or Pro-Stat pending intake.  Encouraged adequate calories and protein with meals in setting of increased needs.  NUTRITION DIAGNOSIS:   Increased nutrient needs related to catabolic illness, cancer and cancer related treatments as evidenced by estimated needs.  Ongoing.  GOAL:   Patient will meet greater than or equal to 90% of their needs  Progressing - extubated this AM and diet advanced.  MONITOR:   PO intake, Supplement acceptance, Labs, Weight trends, I & O's  REASON FOR ASSESSMENT:   Malnutrition Screening Tool    ASSESSMENT:   60 yo female admitted 12/08 with sepsis with positive blood cultures from dialysis catheter ; s/p rapid response this AM (12/10) with respiratory distress due to flash pulmonary edema, transferred to the unit and intubated. Pt with hx of ESRD on HD, CHF, DVT, HTN breast cancer (finished chemo in 10/2015 with surgery planned for 01/2016)  -Patient extubated this morning. Tube feeding discontinued at time of extubation. -Patient advanced to CLD this morning and then to Renal/CHO Modified.  Spoke with patient at bedside. She reports she has dialysis M/W/F at [facility] on [address]. She reports starting HD the same week she was diagnosed with cancer. Her appetite has been poor since she started chemo, but reports she is hungry now. Endorses some abdominal pain from coughing and diarrhea for 3 days. Denies nausea or difficulty chewing/swallowing. Today since extubation she has had some Jell-O, a bowl of broth, and some ice. She reports usual intake of 1-2 meals per day with 1-2 bottles of Ensure or Boost daily. Breakfast is "breakfast food" (did not clarify) and dinner is usually chicken/sandwich/steak with potatoes and green beans or other vegetables. Patient  reports she has taken protein bars at dialysis and also Pro-Stat before.  Patient reports UBW of 200 lbs prior to being diagnosed with cancer. She is unsure of her dry weight (it sounds like they report it to her in kilograms, which she is unfamiliar with). Per chart the last time patient was 200 lbs was in 2014. Recently her weights have been 64-69 kg except for current weight of 76.1 kg (likely taken pre-dialysis yesterday).   Medications reviewed and include: pantoprazole 40 mg Q12hrs, vancomycin.  Labs reviewed: CBG 88-115 past 24 hrs, BUN 21, Creatinine 3.91, EGFR 13. Potassium, Phosphorus, and Magnesium WNL.  Nutrition-Focused physical exam completed. Findings are no fat depletion, no muscle depletion, and no edema. Patient reports she used to have edema in her legs prior to starting HD.  Diet Order:  Diet renal/carb modified with fluid restriction Diet-HS Snack? Nothing; Room service appropriate? Yes; Fluid consistency: Thin  Skin:  Reviewed, no issues  Last BM:  12/9  Height:   Ht Readings from Last 1 Encounters:  12/15/15 '5\' 6"'  (1.676 m)    Weight:   Wt Readings from Last 1 Encounters:  12/17/15 167 lb 12.3 oz (76.1 kg)    Ideal Body Weight:     BMI:  Body mass index is 27.08 kg/m.  Estimated Nutritional Needs:   Kcal:  1680-1870 (27-30 kcal/kg)  Protein:  75-93 (1.2-1.5 grams/kg)  Fluid:  1000 mL plus UOP  EDUCATION NEEDS:   Education needs addressed  Willey Blade, MS, RD, LDN Pager: [phone] After Hours Pager: [phone]

## 2015-12-18 NOTE — Progress Notes (Signed)
Clarified with ICU pharmacist that vancomycin was scheduled to be given after dialysis. Patient not scheduled to receive dialysis today, received dialysis last night but did not receive Vancomycin with treatment. Pharmacist stated to give dose of vancomycin now. Will continue to monitor patient.

## 2015-12-19 ENCOUNTER — Inpatient Hospital Stay: Payer: Medicare Other

## 2015-12-19 DIAGNOSIS — R0600 Dyspnea, unspecified: Secondary | ICD-10-CM

## 2015-12-19 DIAGNOSIS — J189 Pneumonia, unspecified organism: Secondary | ICD-10-CM

## 2015-12-19 DIAGNOSIS — T80211A Bloodstream infection due to central venous catheter, initial encounter: Secondary | ICD-10-CM

## 2015-12-19 LAB — TYPE AND SCREEN
ABO/RH(D): O POS
Antibody Screen: POSITIVE
Donor AG Type: NEGATIVE
UNIT DIVISION: 0
Unit division: 0

## 2015-12-19 LAB — PROTIME-INR
INR: 2.59
Prothrombin Time: 28.3 seconds — ABNORMAL HIGH (ref 11.4–15.2)

## 2015-12-19 LAB — BASIC METABOLIC PANEL
Anion gap: 9 (ref 5–15)
BUN: 37 mg/dL — AB (ref 6–20)
CALCIUM: 8.7 mg/dL — AB (ref 8.9–10.3)
CHLORIDE: 98 mmol/L — AB (ref 101–111)
CO2: 27 mmol/L (ref 22–32)
CREATININE: 5.6 mg/dL — AB (ref 0.44–1.00)
GFR calc Af Amer: 9 mL/min — ABNORMAL LOW (ref >60)
GFR calc non Af Amer: 7 mL/min — ABNORMAL LOW (ref >60)
GLUCOSE: 89 mg/dL (ref 65–99)
Potassium: 3.5 mmol/L (ref 3.5–5.1)
Sodium: 134 mmol/L — ABNORMAL LOW (ref 135–145)

## 2015-12-19 LAB — CBC
HEMATOCRIT: 20.9 % — AB (ref 35.0–47.0)
HEMOGLOBIN: 7 g/dL — AB (ref 12.0–16.0)
MCH: 27.8 pg (ref 26.0–34.0)
MCHC: 33.3 g/dL (ref 32.0–36.0)
MCV: 83.5 fL (ref 80.0–100.0)
Platelets: 113 10*3/uL — ABNORMAL LOW (ref 150–440)
RBC: 2.5 MIL/uL — ABNORMAL LOW (ref 3.80–5.20)
RDW: 17.7 % — AB (ref 11.5–14.5)
WBC: 6.3 10*3/uL (ref 3.6–11.0)

## 2015-12-19 LAB — GLUCOSE, CAPILLARY
GLUCOSE-CAPILLARY: 118 mg/dL — AB (ref 65–99)
GLUCOSE-CAPILLARY: 122 mg/dL — AB (ref 65–99)
GLUCOSE-CAPILLARY: 87 mg/dL (ref 65–99)
Glucose-Capillary: 90 mg/dL (ref 65–99)
Glucose-Capillary: 74 mg/dL (ref 65–99)
Glucose-Capillary: 112 mg/dL — ABNORMAL HIGH (ref 65–99)

## 2015-12-19 LAB — PROCALCITONIN: Procalcitonin: 89 ng/mL

## 2015-12-19 MED ORDER — IPRATROPIUM-ALBUTEROL 0.5-2.5 (3) MG/3ML IN SOLN
3.0000 mL | RESPIRATORY_TRACT | Status: AC
  Administered 2015-12-19 – 2015-12-20 (×8): 3 mL via RESPIRATORY_TRACT
  Filled 2015-12-19 (×9): qty 3

## 2015-12-19 MED ORDER — HYDROCODONE-ACETAMINOPHEN 5-325 MG PO TABS
1.0000 | ORAL_TABLET | ORAL | Status: DC | PRN
  Administered 2015-12-19 – 2015-12-26 (×5): 1 via ORAL
  Filled 2015-12-19 (×4): qty 1

## 2015-12-19 MED ORDER — MORPHINE SULFATE (PF) 4 MG/ML IV SOLN
2.0000 mg | Freq: Once | INTRAVENOUS | Status: AC
  Administered 2015-12-19: 2 mg via INTRAVENOUS
  Filled 2015-12-19: qty 1

## 2015-12-19 MED ORDER — VANCOMYCIN HCL IN DEXTROSE 750-5 MG/150ML-% IV SOLN
750.0000 mg | Freq: Once | INTRAVENOUS | Status: AC
  Administered 2015-12-19: 750 mg via INTRAVENOUS
  Filled 2015-12-19: qty 150

## 2015-12-19 NOTE — Progress Notes (Signed)
[place] [facility] [place] NAME: Teresa Cook    MR#:  [id number]  DATE OF BIRTH:  Aug 23, 1955  SUBJECTIVE: Patient developed respiratory failure transferred to ICU on Sunday, received emergency hemodialysis, intubated. Patient extubated yesterday. This morning the patient said she has shortness of breath, hypoxia, saturations are 91% on 5 L, restarted the BiPAP. Complains of mild abdominal pain .  CHIEF COMPLAINT:   Chief Complaint  Patient presents with  . Weakness    REVIEW OF SYSTEMS:   ROS CONSTITUTIONAL: No fever, fatigue or weakness.  EYES: No blurred or double vision.  EARS, NOSE, AND THROAT: No tinnitus or ear pain.  RESPIRATORY:Shortness of breath  CARDIOVASCULAR: No chest pain, orthopnea, edema.  GASTROINTESTINAL: No nausea, vomiting, diarrhea mild abdominal pain, constipation.  GENITOURINARY: Hemodialysis.  ENDOCRINE: No polyuria, nocturia,  HEMATOLOGY: No anemia, easy bruising or bleeding SKIN: No rash or lesion. MUSCULOSKELETAL: No joint pain or arthritis.   NEUROLOGIC: No tingling, numbness, weakness.  PSYCHIATRY: No anxiety or depression.   DRUG ALLERGIES:   Allergies  Allergen Reactions  . Adhesive [Tape] Itching    Silk tape is ok to use.    VITALS:  Blood pressure 132/71, pulse 93, temperature 98.4 F (36.9 C), temperature source Axillary, resp. rate (!) 25, height 5\' 6"  (1.676 m), weight 76.1 kg (167 lb 12.3 oz), SpO2 98 %.  PHYSICAL EXAMINATION:  GENERAL:  60 y.o.-year-old patient lying in the bed with no acute distress.  EYES: Pupils equal, round, reactive to light and accommodation. No scleral icterus. Extraocular muscles intact.  HEENT: Head atraumatic, normocephalic. Oropharynx and nasopharynx clear.  NECK:  Supple, no jugular venous distention. No thyroid enlargement, no tenderness.  LUNGS: Decreased breath sounds bilaterally. Wheezing. Has basal crepitations. CARDIOVASCULAR: S1, S2 normal. No  murmurs, rubs, or gallops.  ABDOMEN: Soft, nontender, nondistended. Bowel sounds present. No organomegaly or mass.  EXTREMITIES: No pedal edema, cyanosis, or clubbing.  NEUROLOGIC: Cranial nerves II through XII are intact. Muscle strength 5/5 in all extremities. Sensation intact. Gait not checked.  PSYCHIATRIC: The patient is alert and oriented x 3.  SKIN: No obvious rash, lesion, or ulcer.    LABORATORY PANEL:   CBC  Recent Labs Lab 12/19/15 0438  WBC 6.3  HGB 7.0*  HCT 20.9*  PLT 113*   ------------------------------------------------------------------------------------------------------------------  Chemistries   Recent Labs Lab 12/18/15 0502 12/18/15 1801 12/19/15 0438  NA 137  --  134*  K 3.5  --  3.5  CL 101  --  98*  CO2 29  --  27  GLUCOSE 108*  --  89  BUN 21*  --  37*  CREATININE 3.91*  --  5.60*  CALCIUM 8.8*  --  8.7*  MG 1.7 2.1  --   AST 23  --   --   ALT 14  --   --   ALKPHOS 171*  --   --   BILITOT 0.7  --   --    ------------------------------------------------------------------------------------------------------------------  Cardiac Enzymes  Recent Labs Lab 12/18/15 0157  TROPONINI 0.15*   ------------------------------------------------------------------------------------------------------------------  RADIOLOGY:  Dg Chest 1 View  Result Date: 12/17/2015 CLINICAL DATA:  Respiratory failure, intubation EXAM: CHEST 1 VIEW COMPARISON:  Portable exam 1058 hours compared to 12/16/2015 FINDINGS: Tip of endotracheal tube projects 4.3 cm above carina. Nasogastric tube extends into stomach. RIGHT subclavian Port-A-Cath with tip projecting over SVC. Borderline enlargement of cardiac silhouette. Atherosclerotic calcification aorta. Patchy BILATERAL pulmonary infiltrates with perihilar and upper  lobe predominance, question multifocal pneumonia. No pleural effusion or pneumothorax. Bones demineralized. IMPRESSION: New BILATERAL perihilar and upper lobe  infiltrates greatest in RIGHT upper lobe question multifocal pneumonia, asymmetric edema less likely. Electronically Signed   By: Lavonia Dana M.D.   On: 12/17/2015 11:33   Dg Abd 1 View  Result Date: 12/17/2015 CLINICAL DATA:  Orogastric tube placement EXAM: ABDOMEN - 1 VIEW COMPARISON:  Portable exam 1102 hours without priors for comparison FINDINGS: Orogastric tube extends into mid stomach. Linear subsegmental atelectasis medial LEFT lower lobe. RIGHT lung base clear. Visualized bowel gas pattern normal. IMPRESSION: Tip of orogastric tube projects over mid stomach. Electronically Signed   By: Lavonia Dana M.D.   On: 12/17/2015 11:34   Portable Chest Xray  Result Date: 12/18/2015 CLINICAL DATA:  Respiratory failure, sepsis, chronic CHF EXAM: PORTABLE CHEST 1 VIEW COMPARISON:  Chest x-ray of December 17, 2015 FINDINGS: The lungs are well-expanded. There is persistent infiltrate in the right upper lobe which has decreased slightly. Infiltrate in the left CT perihilar region has also improved. The cardiac silhouette is top-normal in size. The pulmonary vascularity is not clearly engorged. There is calcification in the wall of the aortic arch. The endotracheal tube tip lies 3.7 cm above the carina. The esophagogastric tube tip projects below the inferior margin of the image. The porta catheter tip projects at the cavoatrial junction. There is chronic abnormality of the posterior aspect of the right eighth rib. IMPRESSION: Slight interval improvement in the alveolar opacities bilaterally. Pneumonia is favored though asymmetric pulmonary edema could present similarly. The support tubes are in reasonable position. Thoracic aortic atherosclerosis. Electronically Signed   By: David  Martinique M.D.   On: 12/18/2015 07:17    EKG:   Orders placed or performed during the hospital encounter of 12/15/15  . ED EKG  . ED EKG  . EKG 12-Lead  . EKG 12-Lead  . EKG 12-Lead  . EKG 12-Lead    ASSESSMENT AND PLAN:    .Acute .Respiratory failure secondary to combination of fluid overload, multifocal pneumonia. Status post extubation but patient feels short of breath today restarted the BiPAP. Monitor closely in ICU because of borderline respiratory status. 2. Bacteremia, sepsis: Blood cultures from 12 /9 from HD center diphtheroids... Continue vancomycin, cefepime. procalcitonin level 89.  3.Anemia of chronic kidney disease. Hemoglobin dropped yesterday, received one transfusion. hemoglobin improved to 7 today.    #4 .history of breast cancer finished chemotherapy. Port is removed. #5 essential hypertension #6 history of left leg DVT: Because of anemia stopped  Warfarin. Due to anemia. Check ultrasound for the left leg for DVT follow-up. #7 tachycardia: Better: continue beta blockers.   #8 hypertensive urgency secondary to flash pulmonary edema: Improved with hemodialysis. Patient received emergency hemodialysis on 12/10; Nephrology, ID, pulmonary, oncology following the patient.    All the records are reviewed and case discussed with Care Management/Social Workerr. Management plans discussed with the patient, family and they are in agreement.  CODE STATUS: full  TOTAL TIME TAKING CARE OF THIS PATIENT: 40 minutes.CCT)   POSSIBLE D/C IN 4-5 DAYS, DEPENDING ON CLINICAL CONDITION.   Epifanio Lesches M.D on 12/19/2015 at 9:39 AM  Between 7am to 6pm - Pager - [phone]  After 6pm go to [email or web address] - password [email or web address] [facility]  Office  [phone]  CC: Primary care physician; Murlean Iba, MD   Note: This dictation was prepared with Dragon dictation along with smaller phrase technology. Any transcriptional errors that result from this process  are unintentional.

## 2015-12-19 NOTE — Progress Notes (Signed)
Electrolyte Monitoring:    Pharmacy consulted for electrolyte monitoring for 60 yo female ICU patient.   No replacement warranted at this time. Will recheck electrolytes on 12/13.   Pharmacy will continue to monitor and adjust per protocol.   MLS

## 2015-12-19 NOTE — Progress Notes (Signed)
Spoke with Elta Guadeloupe at [facility] regarding sensitivities of corynebacterium growing in recent outpatient blood culture. Elta Guadeloupe will contact the lab and fax sensitivities to [place] in the pharmacy [phone])  Darrow Bussing, PharmD Pharmacy Resident 12/19/2015 4:28 PM

## 2015-12-19 NOTE — Progress Notes (Signed)
HD initiated via R Fem Cath without issue. Goal 3L. Continue to monitor.

## 2015-12-19 NOTE — Progress Notes (Signed)
[facility]  ROUNDING NOTE   Subjective:   Permcath removed . Cath tip sent. No growth Patient went into respiratory distress and was transferred to ICU and intubated for mechanical ventilation. CXR with new right upper and middle lobe infiltrates. Left temp HD catheter placed by Dr. Stevenson Clinch.  Emergent HD done on Sunday. 1100 cc of fluid was removed   Patient had respiratory distress this morning Placed on BiPAP Emergent HD requested Patient seen during HD   HEMODIALYSIS FLOWSHEET:  Blood Flow Rate (mL/min): 400 mL/min Arterial Pressure (mmHg): -190 mmHg Venous Pressure (mmHg): 150 mmHg Transmembrane Pressure (mmHg): 70 mmHg Ultrafiltration Rate (mL/min): 1000 mL/min Dialysate Flow Rate (mL/min): 600 ml/min Conductivity: Machine : 14 Conductivity: Machine : 14 Dialysis Fluid Bolus: Normal Saline Bolus Amount (mL): 100 mL Dialysate Change:  (3k 2.5ca) Intra-Hemodialysis Comments: 1810. tolerating uf well.      Objective:  Vital signs in last 24 hours:  Temp:  [98.3 F (36.8 C)-99 F (37.2 C)] 98.3 F (36.8 C) (12/12 1002) Pulse Rate:  [83-123] 94 (12/12 1145) Resp:  [21-30] 28 (12/12 1145) BP: (87-175)/(55-98) 143/85 (12/12 1145) SpO2:  [89 %-100 %] 100 % (12/12 1145)  Weight change:  Filed Weights   12/15/15 1850 12/15/15 2238 12/17/15 1445  Weight: 64 kg (141 lb) 62.3 kg (137 lb 4.8 oz) 76.1 kg (167 lb 12.3 oz)    Intake/Output: I/O last 3 completed shifts: In: 1234.8 [I.V.:401.8; Blood:333; IV UPJSRPRXY:585] Out: 0    Intake/Output this shift:  Total I/O In: -  Out: 100 [Urine:100]  Physical Exam: General: Critically ill  ENT Moist oral mucus membranes  Eyes: Eyes closed  Neck:  supple  Lungs:  Bilateral rhonchi, crackles  Heart: Irregular, A Fib  Abdomen:  Soft, nontender  Extremities: no peripheral edema.  Neurologic: Alert,    Skin: No lesions  Access:  rt fem temp HD catheter 12/10 Dr. Stevenson Clinch    Basic Metabolic  Panel:  Recent Labs Lab 12/15/15 1952 12/16/15 0227 12/17/15 1300 12/17/15 1521 12/18/15 0502 12/18/15 1801 12/19/15 0438  NA 142 143 137  --  137  --  134*  K 3.6 3.3* 4.2  --  3.5  --  3.5  CL 111 112* 105  --  101  --  98*  CO2 '24 24 23  ' --  29  --  27  GLUCOSE 110* 98 153*  --  108*  --  89  BUN 11 13 26*  --  21*  --  37*  CREATININE 3.36* 4.08* 7.10*  --  3.91*  --  5.60*  CALCIUM 9.5 9.7 8.0*  --  8.8*  --  8.7*  MG 1.7  --  1.9 1.8 1.7 2.1  --   PHOS 3.3  --  5.1* 2.9 3.4 3.5  --     Liver Function Tests:  Recent Labs Lab 12/15/15 1952 12/18/15 0502  AST 27 23  ALT 20 14  ALKPHOS 295* 171*  BILITOT 0.4 0.7  PROT 6.4* 5.3*  ALBUMIN 3.1* 2.6*   No results for input(s): LIPASE, AMYLASE in the last 168 hours. No results for input(s): AMMONIA in the last 168 hours.  CBC:  Recent Labs Lab 12/15/15 1952 12/16/15 0227 12/17/15 0256 12/17/15 1300 12/18/15 0502 12/18/15 2008 12/19/15 0438  WBC 4.2 4.2 5.0 10.3 8.7  --  6.3  NEUTROABS 3.0  --   --  8.9*  --   --   --   HGB 7.1* 7.1* 7.3* 7.7*  6.1* 7.1* 7.0*  HCT 21.4* 20.8* 21.7* 23.1* 18.3* 21.3* 20.9*  MCV 86.1 85.6 86.5 85.3 85.5  --  83.5  PLT 142* 128* 142* 178 119*  --  113*    Cardiac Enzymes:  Recent Labs Lab 12/17/15 1300 12/17/15 1820 12/18/15 0157  TROPONINI 0.06* 0.11* 0.15*    BNP: Invalid input(s): POCBNP  CBG:  Recent Labs Lab 12/18/15 1620 12/18/15 1952 12/18/15 2337 12/19/15 0359 12/19/15 0724  GLUCAP 107* 118* 106* 90 67    Microbiology: Results for orders placed or performed during the hospital encounter of 12/15/15  Blood Culture (routine x 2)     Status: None (Preliminary result)   Collection Time: 12/15/15  7:52 PM  Result Value Ref Range Status   Specimen Description BLOOD LEFT ASSIST CONTROL  Final   Special Requests   Final    BOTTLES DRAWN AEROBIC AND ANAEROBIC 12CCAERO,14CCANA   Culture NO GROWTH 4 DAYS  Final   Report Status PENDING  Incomplete   Blood Culture (routine x 2)     Status: None (Preliminary result)   Collection Time: 12/15/15  7:53 PM  Result Value Ref Range Status   Specimen Description BLOOD L HAND  Final   Special Requests   Final    BOTTLES DRAWN AEROBIC AND ANAEROBIC AER 11CC, ANA 10CC   Culture NO GROWTH 4 DAYS  Final   Report Status PENDING  Incomplete  Urine culture     Status: None   Collection Time: 12/15/15  8:42 PM  Result Value Ref Range Status   Specimen Description URINE, RANDOM  Final   Special Requests NONE  Final   Culture NO GROWTH Performed at [facility]   Final   Report Status 12/17/2015 FINAL  Final  Culture, sputum-assessment     Status: None   Collection Time: 12/15/15 11:31 PM  Result Value Ref Range Status   Specimen Description EXPECTORATED SPUTUM  Final   Special Requests Immunocompromised  Final   Sputum evaluation   Final    Sputum specimen not acceptable for testing.  Please recollect.   [name] AT 1610 12/16/15.PMH   Report Status 12/16/2015 FINAL  Final  MRSA PCR Screening     Status: None   Collection Time: 12/15/15 11:31 PM  Result Value Ref Range Status   MRSA by PCR NEGATIVE NEGATIVE Final    Comment:        The GeneXpert MRSA Assay (FDA approved for NASAL specimens only), is one component of a comprehensive MRSA colonization surveillance program. It is not intended to diagnose MRSA infection nor to guide or monitor treatment for MRSA infections.   Cath Tip Culture     Status: None (Preliminary result)   Collection Time: 12/16/15  3:30 PM  Result Value Ref Range Status   Specimen Description HEMODIALYSIS CATHETER  Final   Special Requests NONE  Final   Culture   Final    NO GROWTH 3 DAYS Performed at [facility]    Report Status PENDING  Incomplete  MRSA PCR Screening     Status: None   Collection Time: 12/17/15 11:32 AM  Result Value Ref Range Status   MRSA by PCR NEGATIVE NEGATIVE Final    Comment:        The GeneXpert MRSA  Assay (FDA approved for NASAL specimens only), is one component of a comprehensive MRSA colonization surveillance program. It is not intended to diagnose MRSA infection nor to guide or monitor treatment for MRSA infections.  Culture, blood (single) w Reflex to ID Panel     Status: None (Preliminary result)   Collection Time: 12/17/15  5:30 PM  Result Value Ref Range Status   Specimen Description PORTA CATH  Final   Special Requests BOTTLES DRAWN AEROBIC AND ANAEROBIC 5ML  Final   Culture NO GROWTH 2 DAYS  Final   Report Status PENDING  Incomplete    Coagulation Studies:  Recent Labs  12/17/15 0256 12/18/15 0502 12/19/15 0438  LABPROT 20.9* 31.0* 28.3*  INR 1.78 2.91 2.59    Urinalysis: No results for input(s): COLORURINE, LABSPEC, PHURINE, GLUCOSEU, HGBUR, BILIRUBINUR, KETONESUR, PROTEINUR, UROBILINOGEN, NITRITE, LEUKOCYTESUR in the last 72 hours.  Invalid input(s): APPERANCEUR    Imaging: Portable Chest Xray  Result Date: 12/18/2015 CLINICAL DATA:  Respiratory failure, sepsis, chronic CHF EXAM: PORTABLE CHEST 1 VIEW COMPARISON:  Chest x-ray of December 17, 2015 FINDINGS: The lungs are well-expanded. There is persistent infiltrate in the right upper lobe which has decreased slightly. Infiltrate in the left CT perihilar region has also improved. The cardiac silhouette is top-normal in size. The pulmonary vascularity is not clearly engorged. There is calcification in the wall of the aortic arch. The endotracheal tube tip lies 3.7 cm above the carina. The esophagogastric tube tip projects below the inferior margin of the image. The porta catheter tip projects at the cavoatrial junction. There is chronic abnormality of the posterior aspect of the right eighth rib. IMPRESSION: Slight interval improvement in the alveolar opacities bilaterally. Pneumonia is favored though asymmetric pulmonary edema could present similarly. The support tubes are in reasonable position. Thoracic  aortic atherosclerosis. Electronically Signed   By: David  Martinique M.D.   On: 12/18/2015 07:17   Dg Abd Portable 1v  Result Date: 12/19/2015 CLINICAL DATA:  Abdominal pain, dialysis patient EXAM: PORTABLE ABDOMEN - 1 VIEW COMPARISON:  Chest and abdomen film of 12/17/2015 FINDINGS: The dialysis catheter tip from a right groin approach extends to the L4 level. Left femoral central venous line tip overlies the left SI joint. The bowel gas pattern is nonspecific. The liver appears slightly prominent. IMPRESSION: 1. Right groin central venous line tip extends to L4. 2. Left groin central venous line tip extends to the left SI joint. 3. No bowel obstruction. Electronically Signed   By: Ivar Drape M.D.   On: 12/19/2015 11:24     Medications:    . aspirin  81 mg Oral Daily  . ceFEPIme  1 g Intravenous Daily  . feeding supplement  1 Container Oral TID BM  . ipratropium-albuterol  3 mL Nebulization Q4H  . lidocaine  20 mL Intradermal Once  . losartan  100 mg Oral Once per day on Sun Tue Thu Sat  . mouth rinse  15 mL Mouth Rinse BID  . metoprolol  50 mg Oral 2 times per day on Sun Tue Thu Sat  .  morphine injection  2 mg Intravenous Once  . pantoprazole  40 mg Oral BID AC  . sertraline  50 mg Oral QHS  . sodium chloride flush  10-40 mL Intracatheter Q12H  . sodium chloride flush  3 mL Intravenous Q12H  . vancomycin  750 mg Intravenous Q M,W,F-HD  . vancomycin  750 mg Intravenous Once   acetaminophen **OR** acetaminophen, benzonatate, bisacodyl, diphenhydrAMINE, ipratropium-albuterol, metoprolol, senna-docusate, sodium chloride flush  Assessment/ Plan:  [name] is a 60 y.o. 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  [address]  1. ESRD on HD MWF: with acute pulmonary edema today Emergent HD on Sunday. 1100 cc fluid removed Now has rt fem temp dialysis cathter Patient seen during dialysis Tolerating well  Emergent treatment  UF goal ~ 3000 cc as tolerated  2. Bacteremia/sepsis: 12/9 gram positive bacilli from [place].- Corynebacterium Permcath removed. Blood cultures and cath tip cultures are no growth    3. Anemia of chronic kidney disease: hemoglobin 7 - Avoid Epogen given recent diagnosis of breast cancer. - Appreciate oncology input.  - blood transfusion given this admission  4. Secondary hyperparathyroidism. -  hold cinacalcet and sevelamer for now     LOS: 4 Dari Carpenito 12/12/201712:03 PM

## 2015-12-19 NOTE — H&P (Signed)
Teresa Cook is an 60 y.o. female.   Chief Complaint: Fluid overload and bacteriemia  HPI: 60 yr old female who is well-known to surgery service with multiple medical issues including end-stage renal disease with dialysis congestive heart failure and history of DVTs as well as left breast metastatic carcinoma. The patient has undergone her chemotherapy and was recently seen in my office to discuss a left mastectomy and possible bilateral mastectomy in January. In the meantime the patient became short of breath with fevers and chills and was admitted to the hospital on 12/8.  She did have the period where she had to be intubated however she has now been extubated. She also is having some pain redness and opening of the incision of her left port site. She states that she's been having this increased pain for a while and there was even some difficulty accessing the port at times. The patient denies any current fevers or chills however she has been off and on fluid overloaded and occasionally requiring some BiPAP.  Past Medical History:  Diagnosis Date  . Anemia   . Anxiety   . Breast cancer ([place])   . CHF (congestive heart failure) ([place])   . Chronic kidney disease   . Depression   . Dialysis patient ([place])   . DVT (deep venous thrombosis) ([facility])    left leg  . DVT (deep venous thrombosis) ([place])   . Gout   . HTN (hypertension)   . Hypertension   . Parathyroid abnormality ([place])   . Parathyroid disease ([place])   . Psoriasis   . Renal insufficiency   . Sickle cell anemia ([facility])    traits    Past Surgical History:  Procedure Laterality Date  . ABDOMINAL HYSTERECTOMY    . APPENDECTOMY    . BREAST BIOPSY Left 10/28/2013   benign  . BREAST EXCISIONAL BIOPSY Left 2002   benign  . INSERTION OF DIALYSIS CATHETER  2014  . PARTIAL HYSTERECTOMY    . PORTACATH PLACEMENT Left 08/21/2015   Procedure: INSERTION PORT-A-CATH;  Surgeon: Hubbard Robinson, MD;  Location: [facility] ORS;  Service: General;   Laterality: Left;    Family History  Problem Relation Age of Onset  . Stroke Mother   . CVA Mother   . Hypertension Mother   . Hypertension Father   . Hypertension Sister   . Diabetes Sister   . Cancer Sister 42    Breast  . Stroke Brother   . Hypertension Brother   . Breast cancer Maternal Aunt 70   Social History:  reports that she has never smoked. She has never used smokeless tobacco. She reports that she does not drink alcohol or use drugs.  Allergies:  Allergies  Allergen Reactions  . Adhesive [Tape] Itching    Silk tape is ok to use.    Medications Prior to Admission  Medication Sig Dispense Refill  . amoxicillin-clavulanate (AUGMENTIN) 500-125 MG tablet Take 1 tablet by mouth daily.    . benzonatate (TESSALON) 200 MG capsule Take 200 mg by mouth 3 (three) times daily as needed for cough.    . clonazePAM (KLONOPIN) 1 MG tablet Take 1 mg by mouth daily. May take 1 and 1/2 tablet if needed.    Marland Kitchen losartan (COZAAR) 50 MG tablet Take 100 mg by mouth daily. Takes on non-dialysis days=Tuesday, Thursday, Saturday and Sunday.    . metoprolol (LOPRESSOR) 100 MG tablet Take 50 mg by mouth 2 (two) times daily. Takes on non-dialysis days=Tuesday, Thursday, Saturday  and Sunday.    . pantoprazole (PROTONIX) 40 MG tablet Take 1 tablet (40 mg total) by mouth 2 (two) times daily before a meal. 60 tablet 0  . sertraline (ZOLOFT) 100 MG tablet Take 100 mg by mouth daily.     . traMADol (ULTRAM) 50 MG tablet Take 50 mg by mouth every 6 (six) hours as needed.     . warfarin (COUMADIN) 5 MG tablet Take 1 tablet Friday, Saturday and Sundays. Take 1 1/2 tablets the other days of the week    . zolpidem (AMBIEN) 10 MG tablet Take 10 mg by mouth at bedtime as needed for sleep.    Marland Kitchen amoxicillin-clavulanate (AUGMENTIN) 500-125 MG tablet Take 1 tablet by mouth daily.    . benzonatate (TESSALON) 200 MG capsule Take 200 mg by mouth 3 (three) times daily as needed.      Results for orders placed or  performed during the hospital encounter of 12/15/15 (from the past 48 hour(s))  Culture, blood (single) w Reflex to ID Panel     Status: None (Preliminary result)   Collection Time: 12/17/15  5:30 PM  Result Value Ref Range   Specimen Description PORTA CATH    Special Requests BOTTLES DRAWN AEROBIC AND ANAEROBIC 5ML    Culture NO GROWTH 2 DAYS    Report Status PENDING   Lactic acid, plasma     Status: None   Collection Time: 12/17/15  6:20 PM  Result Value Ref Range   Lactic Acid, Venous 1.7 0.5 - 1.9 mmol/L  Troponin I (q 6hr x 3)     Status: Abnormal   Collection Time: 12/17/15  6:20 PM  Result Value Ref Range   Troponin I 0.11 (HH) <0.03 ng/mL    Comment: CRITICAL VALUE NOTED. VALUE IS CONSISTENT WITH PREVIOUSLY REPORTED/CALLED VALUE.PMH  Glucose, capillary     Status: Abnormal   Collection Time: 12/17/15  8:08 PM  Result Value Ref Range   Glucose-Capillary 110 (H) 65 - 99 mg/dL  Heparin level (unfractionated)     Status: Abnormal   Collection Time: 12/17/15 10:16 PM  Result Value Ref Range   Heparin Unfractionated 0.97 (H) 0.30 - 0.70 IU/mL    Comment:        IF HEPARIN RESULTS ARE BELOW EXPECTED VALUES, AND PATIENT DOSAGE HAS BEEN CONFIRMED, SUGGEST FOLLOW UP TESTING OF ANTITHROMBIN III LEVELS.   Glucose, capillary     Status: Abnormal   Collection Time: 12/18/15 12:05 AM  Result Value Ref Range   Glucose-Capillary 138 (H) 65 - 99 mg/dL  Troponin I (q 6hr x 3)     Status: Abnormal   Collection Time: 12/18/15  1:57 AM  Result Value Ref Range   Troponin I 0.15 (HH) <0.03 ng/mL    Comment: CRITICAL VALUE NOTED. VALUE IS CONSISTENT WITH PREVIOUSLY REPORTED/CALLED VALUE RWW   Glucose, capillary     Status: Abnormal   Collection Time: 12/18/15  4:21 AM  Result Value Ref Range   Glucose-Capillary 115 (H) 65 - 99 mg/dL  Blood gas, arterial     Status: Abnormal   Collection Time: 12/18/15  5:00 AM  Result Value Ref Range   FIO2 0.50    Mode PRESSURE REGULATED VOLUME  CONTROL    VT 500 mL   Peep/cpap 8.0 cm H20   pH, Arterial 7.54 (H) 7.350 - 7.450   pCO2 arterial 35 32.0 - 48.0 mmHg   pO2, Arterial 126 (H) 83.0 - 108.0 mmHg   Bicarbonate 29.9 (H) 20.0 -  28.0 mmol/L   Acid-Base Excess 7.2 (H) 0.0 - 2.0 mmol/L   O2 Saturation 99.2 %   Patient temperature 37.0    Collection site RIGHT RADIAL    Sample type ARTERIAL DRAW    Allens test (pass/fail) PASS PASS   Mechanical Rate 15   Protime-INR     Status: Abnormal   Collection Time: 12/18/15  5:02 AM  Result Value Ref Range   Prothrombin Time 31.0 (H) 11.4 - 15.2 seconds   INR 2.91   Comprehensive metabolic panel     Status: Abnormal   Collection Time: 12/18/15  5:02 AM  Result Value Ref Range   Sodium 137 135 - 145 mmol/L   Potassium 3.5 3.5 - 5.1 mmol/L   Chloride 101 101 - 111 mmol/L   CO2 29 22 - 32 mmol/L   Glucose, Bld 108 (H) 65 - 99 mg/dL   BUN 21 (H) 6 - 20 mg/dL   Creatinine, Ser 3.91 (H) 0.44 - 1.00 mg/dL   Calcium 8.8 (L) 8.9 - 10.3 mg/dL   Total Protein 5.3 (L) 6.5 - 8.1 g/dL   Albumin 2.6 (L) 3.5 - 5.0 g/dL   AST 23 15 - 41 U/L   ALT 14 14 - 54 U/L   Alkaline Phosphatase 171 (H) 38 - 126 U/L   Total Bilirubin 0.7 0.3 - 1.2 mg/dL   GFR calc non Af Amer 11 (L) >60 mL/min   GFR calc Af Amer 13 (L) >60 mL/min    Comment: (NOTE) The eGFR has been calculated using the CKD EPI equation. This calculation has not been validated in all clinical situations. eGFR's persistently <60 mL/min signify possible Chronic Kidney Disease.    Anion gap 7 5 - 15  CBC     Status: Abnormal   Collection Time: 12/18/15  5:02 AM  Result Value Ref Range   WBC 8.7 3.6 - 11.0 K/uL   RBC 2.13 (L) 3.80 - 5.20 MIL/uL   Hemoglobin 6.1 (L) 12.0 - 16.0 g/dL   HCT 18.3 (L) 35.0 - 47.0 %   MCV 85.5 80.0 - 100.0 fL   MCH 28.5 26.0 - 34.0 pg   MCHC 33.3 32.0 - 36.0 g/dL   RDW 17.8 (H) 11.5 - 14.5 %   Platelets 119 (L) 150 - 440 K/uL  Procalcitonin     Status: None   Collection Time: 12/18/15  5:02 AM   Result Value Ref Range   Procalcitonin 64.24 ng/mL    Comment:        Interpretation: PCT >= 10 ng/mL: Important systemic inflammatory response, almost exclusively due to severe bacterial sepsis or septic shock. (NOTE)         ICU PCT Algorithm               Non ICU PCT Algorithm    ----------------------------     ------------------------------         PCT < 0.25 ng/mL                 PCT < 0.1 ng/mL     Stopping of antibiotics            Stopping of antibiotics       strongly encouraged.               strongly encouraged.    ----------------------------     ------------------------------       PCT level decrease by  PCT < 0.25 ng/mL       >= 80% from peak PCT       OR PCT 0.25 - 0.5 ng/mL          Stopping of antibiotics                                             encouraged.     Stopping of antibiotics           encouraged.    ----------------------------     ------------------------------       PCT level decrease by              PCT >= 0.25 ng/mL       < 80% from peak PCT        AND PCT >= 0.5 ng/mL             Continuing antibiotics                                              encouraged.       Continuing antibiotics            encouraged.    ----------------------------     ------------------------------     PCT level increase compared          PCT > 0.5 ng/mL         with peak PCT AND          PCT >= 0.5 ng/mL             Escalation of antibiotics                                          strongly encouraged.      Escalation of antibiotics        strongly encouraged.   Magnesium     Status: None   Collection Time: 12/18/15  5:02 AM  Result Value Ref Range   Magnesium 1.7 1.7 - 2.4 mg/dL  Phosphorus     Status: None   Collection Time: 12/18/15  5:02 AM  Result Value Ref Range   Phosphorus 3.4 2.5 - 4.6 mg/dL  Heparin level (unfractionated)     Status: Abnormal   Collection Time: 12/18/15  5:02 AM  Result Value Ref Range   Heparin Unfractionated 0.75  (H) 0.30 - 0.70 IU/mL    Comment:        IF HEPARIN RESULTS ARE BELOW EXPECTED VALUES, AND PATIENT DOSAGE HAS BEEN CONFIRMED, SUGGEST FOLLOW UP TESTING OF ANTITHROMBIN III LEVELS.   Glucose, capillary     Status: None   Collection Time: 12/18/15  7:39 AM  Result Value Ref Range   Glucose-Capillary 94 65 - 99 mg/dL  Type and screen [place]     Status: None   Collection Time: 12/18/15  9:12 AM  Result Value Ref Range   ABO/RH(D) O POS    Antibody Screen POS    Sample Expiration 12/21/2015    Antibody Identification ANTI K NON SPECIFIC ANTIBODY REACTIVITY    Unit Number [id number]    Blood Component Type RBC LR PHER1  Unit division 00    Status of Unit REL FROM [facility]    Transfusion Status OK TO TRANSFUSE    Crossmatch Result COMPATIBLE    Unit Number [id number]    Blood Component Type RBC LR PHER1    Unit division 00    Status of Unit ISSUED,FINAL    Transfusion Status OK TO TRANSFUSE    Crossmatch Result COMPATIBLE    Donor AG Type NEGATIVE FOR KELL ANTIGEN   Prepare RBC     Status: None   Collection Time: 12/18/15  9:42 AM  Result Value Ref Range   Order Confirmation ORDER PROCESSED BY BLOOD BANK   Glucose, capillary     Status: None   Collection Time: 12/18/15 11:50 AM  Result Value Ref Range   Glucose-Capillary 88 65 - 99 mg/dL  Glucose, capillary     Status: Abnormal   Collection Time: 12/18/15  4:20 PM  Result Value Ref Range   Glucose-Capillary 107 (H) 65 - 99 mg/dL  Magnesium     Status: None   Collection Time: 12/18/15  6:01 PM  Result Value Ref Range   Magnesium 2.1 1.7 - 2.4 mg/dL  Phosphorus     Status: None   Collection Time: 12/18/15  6:01 PM  Result Value Ref Range   Phosphorus 3.5 2.5 - 4.6 mg/dL  Glucose, capillary     Status: Abnormal   Collection Time: 12/18/15  7:52 PM  Result Value Ref Range   Glucose-Capillary 118 (H) 65 - 99 mg/dL  Hemoglobin and hematocrit, blood     Status: Abnormal   Collection Time:  12/18/15  8:08 PM  Result Value Ref Range   Hemoglobin 7.1 (L) 12.0 - 16.0 g/dL   HCT 21.3 (L) 35.0 - 47.0 %  Glucose, capillary     Status: Abnormal   Collection Time: 12/18/15 11:37 PM  Result Value Ref Range   Glucose-Capillary 106 (H) 65 - 99 mg/dL  Glucose, capillary     Status: None   Collection Time: 12/19/15  3:59 AM  Result Value Ref Range   Glucose-Capillary 90 65 - 99 mg/dL  Protime-INR     Status: Abnormal   Collection Time: 12/19/15  4:38 AM  Result Value Ref Range   Prothrombin Time 28.3 (H) 11.4 - 15.2 seconds   INR 2.59   Procalcitonin     Status: None   Collection Time: 12/19/15  4:38 AM  Result Value Ref Range   Procalcitonin 89.00 ng/mL    Comment:        Interpretation: PCT >= 10 ng/mL: Important systemic inflammatory response, almost exclusively due to severe bacterial sepsis or septic shock. (NOTE)         ICU PCT Algorithm               Non ICU PCT Algorithm    ----------------------------     ------------------------------         PCT < 0.25 ng/mL                 PCT < 0.1 ng/mL     Stopping of antibiotics            Stopping of antibiotics       strongly encouraged.               strongly encouraged.    ----------------------------     ------------------------------       PCT level decrease by  PCT < 0.25 ng/mL       >= 80% from peak PCT       OR PCT 0.25 - 0.5 ng/mL          Stopping of antibiotics                                             encouraged.     Stopping of antibiotics           encouraged.    ----------------------------     ------------------------------       PCT level decrease by              PCT >= 0.25 ng/mL       < 80% from peak PCT        AND PCT >= 0.5 ng/mL             Continuing antibiotics                                              encouraged.       Continuing antibiotics            encouraged.    ----------------------------     ------------------------------     PCT level increase compared          PCT  > 0.5 ng/mL         with peak PCT AND          PCT >= 0.5 ng/mL             Escalation of antibiotics                                          strongly encouraged.      Escalation of antibiotics        strongly encouraged.   CBC     Status: Abnormal   Collection Time: 12/19/15  4:38 AM  Result Value Ref Range   WBC 6.3 3.6 - 11.0 K/uL   RBC 2.50 (L) 3.80 - 5.20 MIL/uL   Hemoglobin 7.0 (L) 12.0 - 16.0 g/dL   HCT 20.9 (L) 35.0 - 47.0 %   MCV 83.5 80.0 - 100.0 fL   MCH 27.8 26.0 - 34.0 pg   MCHC 33.3 32.0 - 36.0 g/dL   RDW 17.7 (H) 11.5 - 14.5 %   Platelets 113 (L) 150 - 440 K/uL  Basic metabolic panel     Status: Abnormal   Collection Time: 12/19/15  4:38 AM  Result Value Ref Range   Sodium 134 (L) 135 - 145 mmol/L   Potassium 3.5 3.5 - 5.1 mmol/L   Chloride 98 (L) 101 - 111 mmol/L   CO2 27 22 - 32 mmol/L   Glucose, Bld 89 65 - 99 mg/dL   BUN 37 (H) 6 - 20 mg/dL   Creatinine, Ser 5.60 (H) 0.44 - 1.00 mg/dL   Calcium 8.7 (L) 8.9 - 10.3 mg/dL   GFR calc non Af Amer 7 (L) >60 mL/min   GFR calc Af Amer 9 (L) >60 mL/min    Comment: (NOTE) The eGFR has been calculated using the CKD EPI  equation. This calculation has not been validated in all clinical situations. eGFR's persistently <60 mL/min signify possible Chronic Kidney Disease.    Anion gap 9 5 - 15  Glucose, capillary     Status: None   Collection Time: 12/19/15  7:24 AM  Result Value Ref Range   Glucose-Capillary 87 65 - 99 mg/dL  Glucose, capillary     Status: None   Collection Time: 12/19/15 12:08 PM  Result Value Ref Range   Glucose-Capillary 74 65 - 99 mg/dL  Glucose, capillary     Status: Abnormal   Collection Time: 12/19/15  4:15 PM  Result Value Ref Range   Glucose-Capillary 122 (H) 65 - 99 mg/dL   Portable Chest Xray  Result Date: 12/18/2015 CLINICAL DATA:  Respiratory failure, sepsis, chronic CHF EXAM: PORTABLE CHEST 1 VIEW COMPARISON:  Chest x-ray of December 17, 2015 FINDINGS: The lungs are  well-expanded. There is persistent infiltrate in the right upper lobe which has decreased slightly. Infiltrate in the left CT perihilar region has also improved. The cardiac silhouette is top-normal in size. The pulmonary vascularity is not clearly engorged. There is calcification in the wall of the aortic arch. The endotracheal tube tip lies 3.7 cm above the carina. The esophagogastric tube tip projects below the inferior margin of the image. The porta catheter tip projects at the cavoatrial junction. There is chronic abnormality of the posterior aspect of the right eighth rib. IMPRESSION: Slight interval improvement in the alveolar opacities bilaterally. Pneumonia is favored though asymmetric pulmonary edema could present similarly. The support tubes are in reasonable position. Thoracic aortic atherosclerosis. Electronically Signed   By: David  Martinique M.D.   On: 12/18/2015 07:17   Dg Abd Portable 1v  Result Date: 12/19/2015 CLINICAL DATA:  Abdominal pain, dialysis patient EXAM: PORTABLE ABDOMEN - 1 VIEW COMPARISON:  Chest and abdomen film of 12/17/2015 FINDINGS: The dialysis catheter tip from a right groin approach extends to the L4 level. Left femoral central venous line tip overlies the left SI joint. The bowel gas pattern is nonspecific. The liver appears slightly prominent. IMPRESSION: 1. Right groin central venous line tip extends to L4. 2. Left groin central venous line tip extends to the left SI joint. 3. No bowel obstruction. Electronically Signed   By: Ivar Drape M.D.   On: 12/19/2015 11:24    Review of Systems  Constitutional: Positive for chills, diaphoresis, fever and malaise/fatigue. Negative for weight loss.  HENT: Negative for congestion and sore throat.   Respiratory: Positive for cough, sputum production and shortness of breath. Negative for hemoptysis and wheezing.   Cardiovascular: Positive for chest pain, palpitations and leg swelling. Negative for claudication.  Gastrointestinal:  Negative for abdominal pain, nausea and vomiting.  Genitourinary: Negative for dysuria, flank pain and hematuria.  Musculoskeletal: Negative for back pain, joint pain, myalgias and neck pain.  Skin: Negative for itching and rash.  Neurological: Positive for weakness. Negative for dizziness, loss of consciousness and headaches.  Psychiatric/Behavioral: Negative for depression. The patient is not nervous/anxious.   All other systems reviewed and are negative.   Blood pressure (!) 160/110, pulse (!) 110, temperature 98 F (36.7 C), temperature source Axillary, resp. rate (!) 24, height '5\' 6"'  (1.676 m), weight 167 lb 12.3 oz (76.1 kg), SpO2 97 %. Physical Exam  Vitals reviewed. Constitutional: She is oriented to person, place, and time. She appears well-developed and well-nourished. No distress.  HENT:  Head: Normocephalic and atraumatic.  Right Ear: External ear normal.  Left Ear:  External ear normal.  Nose: Nose normal.  Mouth/Throat: Oropharynx is clear and moist. No oropharyngeal exudate.  Eyes: EOM are normal. Pupils are equal, round, and reactive to light. No scleral icterus.  Neck: Normal range of motion. Neck supple. No tracheal deviation present.  Cardiovascular: Regular rhythm, normal heart sounds and intact distal pulses.  Exam reveals no gallop and no friction rub.   No murmur heard. Tachycardia  Respiratory: Effort normal. No respiratory distress. She has wheezes. She has rales. She exhibits tenderness.  Rales and wheezing worse on the right side, some crackles on the left   Left chest wall port site lateral edge with concern for either nonhealing or partial opening of the site, also with some redness that appearance of a previous puncture site over the port. Patient expresses tenderness with palpation  GI: Soft. Bowel sounds are normal. She exhibits no distension. There is no tenderness. There is no rebound.  Musculoskeletal: Normal range of motion. She exhibits no edema,  tenderness or deformity.  Neurological: She is alert and oriented to person, place, and time. No cranial nerve deficit.  Skin: Skin is warm and dry. No rash noted. No erythema.  Psychiatric: She has a normal mood and affect. Her behavior is normal. Judgment and thought content normal.     Assessment/Plan 60 year old female with multiple medical issues here for sepsis and fluid overload. I am very familiar with this patient as well as her past medical history of multiple bouts of fluid overload requiring hospitalizations and occasionally intubation. Also personally reviewed the patient's laboratory values where she does not have an elevated white blood cell count of her she has been immunocompromise and has been anemic. I also personally reviewed her cultures from outpatient that are growing corynebacterium recent report. I also personally reviewed her chest x-ray images which did show concern for a possible right upper pneumonia as well as some infiltrate in the left. I also reviewed the radiology reads as above.  I have discussed this patient with the critical care team Dr. Stevenson Clinch, nephrology Dr. Candiss Norse and ID Dr. Ola Spurr as well as reviewed the notes from Dr. Grayland Ormond.  The patient has had removal of her right perm cath and does have some pain and tenderness over her left port site. Everyone is in agreement that this could be a source of infection and does need to be removed. I did discuss with the patient that this could be done at the bedside using some local anesthetic however the patient is extremely anxious and would prefer to have some sedation with that. I discussed the risk benefits alternatives with the patient to include a risk of bleeding, and infection of the incision site as well as the anesthetic risk of heart attack stroke and blood clots and death. The patient was given opportunity to ask questions and have them answered.  I have added her to the OR schedule in the early morning for port  removal.   Hubbard Robinson, MD 12/19/2015, 4:56 PM

## 2015-12-19 NOTE — Progress Notes (Signed)
Chaplain was making his rounds and visited with pt in room IC-8. This was a follow up visit. Provided the ministry of prayer and emotional support.    12/18/15 1050  Clinical Encounter Type  Visited With Patient  Visit Type Follow-up  Referral From Nurse  Spiritual Encounters  Spiritual Needs Prayer;Emotional

## 2015-12-19 NOTE — Progress Notes (Signed)
Pre dialysis assessment 

## 2015-12-19 NOTE — Progress Notes (Signed)
PULMONARY / CRITICAL CARE MEDICINE   Name: Teresa Cook MRN: [id number] DOB: 1955/06/13    ADMISSION DATE:  12/15/2015  BRIEF HISTORY: 60 y.o.femalewith a known history of End-stage renal disease on hemodialysis, CHF, DVT of hypertension, breast cancerpresents to the emergency department complaining of cough, congestion and fever on 12/15/15. Patient was in a usual state of health until one week ago when she developed worsening symptoms of chest congestion associated with cough productive of green sputum and fevers to 101 at home. She reports chest tightness and pain associated with coughing. She has been taking Augmentin for the past 3 days but has been worsening..  Patient finished chemotherapy in November 2017 and is planned for surgery in January 2018. Patient has dialysis Monday Wednesday and Friday. Admitted by hospitalist service, had suspicion of line infection (either RIJ per cath, or Left port-a-cath), 12/9 had permcath removed, on 12/10 To have high blood pressure, systolic in the 160V, complained about difficulty breathing, evaluated by nursing staff found to be in respiratory distress oxygen saturation initially was 90% with acutely dropped to 60s, rapid response was called, primary care team was also there, Lasix 40 mg as given per physician order, patient continued to be in respiratory distress she was transferred to the ICU, where she was noted to have white foam and frothing at the mouth at times, she was emergently intubated and placed on mechanical ventilation. Off note, she was scheduled to have her chemotherapy port removed on Monday, 12/18/2015.  SUBJECTIVE:  More sob this AM, on bipap   SIGNIFICANT EVENTS: 12/8> admitted for suspected line infection (either RIJ per cath, or Left port-a-cath) 12/9>R permcath removed 12/10> acute hypoxic respiratory failure, flash pulmonary edema, HTN, hypoxia, intubated.  12/11> extubated  VITAL SIGNS: Temp:  [98.4 F (36.9 C)-99  F (37.2 C)] 98.4 F (36.9 C) (12/11 2000) Pulse Rate:  [83-116] 93 (12/12 0800) Resp:  [18-30] 25 (12/12 0800) BP: (87-132)/(55-88) 132/71 (12/12 0800) SpO2:  [89 %-98 %] 98 % (12/12 0800) HEMODYNAMICS:   VENTILATOR SETTINGS:   INTAKE / OUTPUT:  Intake/Output Summary (Last 24 hours) at 12/19/15 3710 Last data filed at 12/19/15 0848  Gross per 24 hour  Intake              583 ml  Output              100 ml  Net              483 ml    Review of Systems  Constitutional: Negative for chills and fever.  HENT: Positive for sore throat.   Eyes: Negative for blurred vision.  Respiratory: Positive for cough and shortness of breath. Negative for sputum production.   Cardiovascular: Negative for chest pain.  Gastrointestinal: Negative for heartburn and nausea.  Genitourinary: Negative for dysuria.  Musculoskeletal: Negative for myalgias.  Neurological: Negative for dizziness.  Endo/Heme/Allergies: Does not bruise/bleed easily.    Physical Exam  Constitutional: She is oriented to person, place, and time and well-developed, well-nourished, and in no distress. No distress.  Eyes: Conjunctivae and EOM are normal. Pupils are equal, round, and reactive to light.  Neck: Normal range of motion. Neck supple.  Cardiovascular:  Regular, tachycardia  Pulmonary/Chest: She is in respiratory distress. She has no wheezes.  Coarse upper airway sounds Tight chest sounds  Abdominal: Soft. Bowel sounds are normal. She exhibits no distension. There is no tenderness.  Musculoskeletal: Normal range of motion.  Neurological: She is alert and oriented to person,  place, and time.  Skin: Skin is warm and dry. She is not diaphoretic.  Nursing note and vitals reviewed.    LABS:  CBC  Recent Labs Lab 12/17/15 1300 12/18/15 0502 12/18/15 2008 12/19/15 0438  WBC 10.3 8.7  --  6.3  HGB 7.7* 6.1* 7.1* 7.0*  HCT 23.1* 18.3* 21.3* 20.9*  PLT 178 119*  --  113*   Coag's  Recent Labs Lab  12/17/15 0256 12/17/15 1300 12/18/15 0502 12/19/15 0438  APTT  --  35  --   --   INR 1.78  --  2.91 2.59   BMET  Recent Labs Lab 12/17/15 1300 12/18/15 0502 12/19/15 0438  NA 137 137 134*  K 4.2 3.5 3.5  CL 105 101 98*  CO2 23 29 27   BUN 26* 21* 37*  CREATININE 7.10* 3.91* 5.60*  GLUCOSE 153* 108* 89   Electrolytes  Recent Labs Lab 12/17/15 1300 12/17/15 1521 12/18/15 0502 12/18/15 1801 12/19/15 0438  CALCIUM 8.0*  --  8.8*  --  8.7*  MG 1.9 1.8 1.7 2.1  --   PHOS 5.1* 2.9 3.4 3.5  --    Sepsis Markers  Recent Labs Lab 12/15/15 1952 12/17/15 1300 12/17/15 1820 12/18/15 0502 12/19/15 0438  LATICACIDVEN 1.1 1.7 1.7  --   --   PROCALCITON  --  1.58  --  64.24 89.00   ABG  Recent Labs Lab 12/17/15 1200 12/18/15 0500  PHART 7.32* 7.54*  PCO2ART 43 35  PO2ART 165* 126*   Liver Enzymes  Recent Labs Lab 12/15/15 1952 12/18/15 0502  AST 27 23  ALT 20 14  ALKPHOS 295* 171*  BILITOT 0.4 0.7  ALBUMIN 3.1* 2.6*   Cardiac Enzymes  Recent Labs Lab 12/17/15 1300 12/17/15 1820 12/18/15 0157  TROPONINI 0.06* 0.11* 0.15*   Glucose  Recent Labs Lab 12/18/15 1150 12/18/15 1620 12/18/15 1952 12/18/15 2337 12/19/15 0359 12/19/15 0724  GLUCAP 88 107* 118* 106* 90 87    Imaging No results found. Cultures: BCx2 12/8 UC  Sputum Permcath tip culture 12/9 Port-a-cath culture 12/10  Antibiotics: Vanc 12/8> Cefepime 12/8>  Lines: Vascath Fem CVL Fem  DISCUSSION: 60 year old female past medical history of end-stage renal disease on hemodialysis, CHF, DVT, hypertension, breast cancer, last chemotherapy treatment was in November, initially presented on 12/8 with complaint of coughing, congestion and fever, admitted by hospitalist team for suspected sepsis from underlying line infection. On 12/10, noted to be in severe respiratory distress with flash pulmonary edema and hypertension, emergently intubated and transferred to the ICU.  Extubated on 12/11, doing well from a respiratory standpoint.   ASSESSMENT / PLAN: PULMONARY Acute hypoxic respiratory failure-resolved Flash pulmonary edema- improving Right greater than left lung infiltrates, most likely fluid- now improving Respiratory distress P:   Extubated 12/11 Keep O2 sats greater than 88% CXR 12/11 reviewed - already with clearing of right sided infiltrates - most likely asymmetric edema from flash pulmonary edema, especially given rapid clearing of infiltrates in 1 day.  HD performed yesterday (1L removed) Would cont with above abx for suspected line/port infection.  Mild respiratory distress this AM, tight chest sounds, schedule duonebs, may get a session of HD this Am.   CARDIOVASCULAR A: HTN urgency-resolving Mild troponin elevation P:  Will cont with BP meds Mild troponin elevation - possibly demand ischemia Can restart PO HTN meds, slowly  RENAL ESRD on HD P:   RIJ permcath removed due to suspicion on line infection on 12/9 HD today per nephro  Nephro following  GASTROINTESTINAL PUD  HEMATOLOGIC Hx of Breast Ca Hx of Left Leg DVT on warfarin P:  Now s/p chemo in November Spoke with Heme/onc  - no further chemo planned, okay to remove port Stop warfarin, will start hep gtt - can resume after removal of port-a-cath Monitor INR   INFECTIOUS Sepsis - 2/2 to possible port or permcath infection P:   -cont with above abx -f\u on cultures -per Dr. Lorenso Courier, obtain a culture from left port-a-cath prior to removal, permath on R removed 12/9 PCT 1.58>64.24  ENDOCRINE ICU hypo/hyperglycemia protocol  NEUROLOGIC No issues   PCCM following on consults.    I have personally obtained a history, examined the patient, evaluated laboratory and imaging results, formulated the assessment and plan and placed orders.  Pulmonary Care Time devoted to patient care services described in this note is 35 minutes.   Vilinda Boehringer, MD [place]  [facility] Pager [phone] (please enter 7-digits) On Call Pager - [phone] (please enter 7-digits   Note: This note was prepared with Dragon dictation along with smaller phrase technology. Any transcriptional errors that result from this process are unintentional.

## 2015-12-19 NOTE — Progress Notes (Signed)
Spoke with Dr Sissy Hoff. Patient is currently on bipap and unable to take any oral medications. At this point no additional orders and MD is aware.

## 2015-12-19 NOTE — Progress Notes (Signed)
Patient lethargic throughout shift but has perked up this afternoon after dialysis. Patient alert and oriented and is on 4l of oxygen. She has been coughing on and off which is causing her discomfort to her abdominal and chest region. Dr Sissy Hoff notified and pain medication ordered. Port scheduled to be removed tomorrow in the am consent signed.

## 2015-12-19 NOTE — Progress Notes (Signed)
ANTICOAGULATION CONSULT NOTE - Follow Up Consult  Pharmacy Consult for Heparin Drip Management  Indication: hx of VTE  Allergies  Allergen Reactions  . Adhesive [Tape] Itching    Silk tape is ok to use.    Patient Measurements: Height: 5\' 6"  (167.6 cm) Weight:  (uta see previous note) IBW/kg (Calculated) : 59.3 Heparin Dosing Weight:   Vital Signs: Temp: 99.8 F (37.7 C) (12/12 1700) Temp Source: Axillary (12/12 1336) BP: 128/79 (12/12 1800) Pulse Rate: 101 (12/12 1800)  Labs:  Recent Labs  12/17/15 0256 12/17/15 1300 12/17/15 1820 12/17/15 2216 12/18/15 0157 12/18/15 0502 12/18/15 2008 12/19/15 0438  HGB 7.3* 7.7*  --   --   --  6.1* 7.1* 7.0*  HCT 21.7* 23.1*  --   --   --  18.3* 21.3* 20.9*  PLT 142* 178  --   --   --  119*  --  113*  APTT  --  35  --   --   --   --   --   --   LABPROT 20.9*  --   --   --   --  31.0*  --  28.3*  INR 1.78  --   --   --   --  2.91  --  2.59  HEPARINUNFRC  --   --   --  0.97*  --  0.75*  --   --   CREATININE  --  7.10*  --   --   --  3.91*  --  5.60*  TROPONINI  --  0.06* 0.11*  --  0.15*  --   --   --     Estimated Creatinine Clearance: 11 mL/min (by C-G formula based on SCr of 5.6 mg/dL (H)).   Assessment:  Pharmacy consulted for heparin drip management for 60 yo female transferred to ICU due to flash pulmonary edema and requiring emergent intubation. Pharmacy previously consulted for warfarin management; patient is currently subtherapeutic. Patient has history significant for DVT x 2 and required warfarin as an outpatient. Patient initiated on heparin drip went transitioned to ICU.   Goal of Therapy:  Anti-Xa 0.3-0.7  Monitor platelets by anticoagulation protocol: Yes   Plan:  Heparin drip is currently on hold as patient has therapeutic INR. Will obtain daily INRs and consider restart of heparin drip when INR < 2. Patient scheduled for port removal on 12/13. Will discuss with MD prior to restarting heparin drip.    Pharmacy will continue to monitor and adjust per consult.    MLS 12/19/2015 7:57 PM

## 2015-12-19 NOTE — Progress Notes (Signed)
HD completed without issue. 3L goal met. Patient tolerated well. 

## 2015-12-19 NOTE — Progress Notes (Signed)
Pre dialysis  

## 2015-12-19 NOTE — Progress Notes (Signed)
Post HD assessment  

## 2015-12-19 NOTE — Progress Notes (Signed)
Pharmacy Antibiotic Note  Teresa Cook is a 60 y.o. female admitted on 12/15/2015 with sepsis, being treated for line infection. Pharmacy has been consulted for vancomycin and cefepime dosing.  Plan: 1. Cefepime 1g IV Q24hr.  2. Vancomycin 750mg  to be given at the conclusion of each dialysis session. Patient received dialysis 12/12. Will order vancomycin x 1. Will obtain trough prior to dialysis on 12/15.   Height: 5\' 6"  (167.6 cm) Weight:  (uta see previous note) IBW/kg (Calculated) : 59.3  Temp (24hrs), Avg:98.7 F (37.1 C), Min:98 F (36.7 C), Max:99.8 F (37.7 C)   Recent Labs Lab 12/15/15 1952 12/16/15 0227 12/17/15 0256 12/17/15 1300 12/17/15 1820 12/18/15 0502 12/19/15 0438  WBC 4.2 4.2 5.0 10.3  --  8.7 6.3  CREATININE 3.36* 4.08*  --  7.10*  --  3.91* 5.60*  LATICACIDVEN 1.1  --   --  1.7 1.7  --   --     Estimated Creatinine Clearance: 11 mL/min (by C-G formula based on SCr of 5.6 mg/dL (H)).    Allergies  Allergen Reactions  . Adhesive [Tape] Itching    Silk tape is ok to use.    Antimicrobials this admission: Cefepime 12/8 >>  Vancomycin 12/8 >>   Dose adjustments this admission: N/A  Microbiology results: 12/10 BCx: no growth < 24 hours 12/9 Cath Tip Cx: no growth x 2 days  12/8 BCx: no growth x 3  12/8 Urine: no growth  12/8 Sputum: specifmen not acceptable for testing  12/8 MRSA PCR: negative  12/7 BCx Outpt: corynebacterium   Pharmacy will continue to monitor and adjust per consult.   Simpson,Michael L 12/19/2015 7:53 PM

## 2015-12-20 ENCOUNTER — Encounter
Admission: EM | Disposition: A | Discharge status: Home Health | Payer: Self-pay | Source: Home / Self Care | Attending: Internal Medicine

## 2015-12-20 ENCOUNTER — Encounter: Payer: Self-pay | Admitting: Surgery

## 2015-12-20 ENCOUNTER — Inpatient Hospital Stay: Payer: Medicare Other | Admitting: Anesthesiology

## 2015-12-20 HISTORY — PX: PORT-A-CATH REMOVAL: SHX5289

## 2015-12-20 LAB — CBC WITH DIFFERENTIAL/PLATELET
BASOS ABS: 0 10*3/uL (ref 0–0.1)
BASOS PCT: 0 %
EOS PCT: 2 %
Eosinophils Absolute: 0.1 10*3/uL (ref 0–0.7)
HCT: 22.2 % — ABNORMAL LOW (ref 35.0–47.0)
Hemoglobin: 7.4 g/dL — ABNORMAL LOW (ref 12.0–16.0)
Lymphocytes Relative: 20 %
Lymphs Abs: 1 10*3/uL (ref 1.0–3.6)
MCH: 27.8 pg (ref 26.0–34.0)
MCHC: 33.5 g/dL (ref 32.0–36.0)
MCV: 83 fL (ref 80.0–100.0)
MONO ABS: 0.4 10*3/uL (ref 0.2–0.9)
Monocytes Relative: 8 %
Neutro Abs: 3.6 10*3/uL (ref 1.4–6.5)
Neutrophils Relative %: 70 %
PLATELETS: 143 10*3/uL — AB (ref 150–440)
RBC: 2.67 MIL/uL — ABNORMAL LOW (ref 3.80–5.20)
RDW: 18 % — AB (ref 11.5–14.5)
WBC: 5.2 10*3/uL (ref 3.6–11.0)

## 2015-12-20 LAB — CULTURE, BLOOD (ROUTINE X 2)
CULTURE: NO GROWTH
CULTURE: NO GROWTH

## 2015-12-20 LAB — BASIC METABOLIC PANEL
ANION GAP: 10 (ref 5–15)
BUN: 21 mg/dL — AB (ref 6–20)
CALCIUM: 9.1 mg/dL (ref 8.9–10.3)
CO2: 29 mmol/L (ref 22–32)
Chloride: 98 mmol/L — ABNORMAL LOW (ref 101–111)
Creatinine, Ser: 3.46 mg/dL — ABNORMAL HIGH (ref 0.44–1.00)
GFR calc Af Amer: 15 mL/min — ABNORMAL LOW (ref >60)
GFR, EST NON AFRICAN AMERICAN: 13 mL/min — AB (ref >60)
GLUCOSE: 104 mg/dL — AB (ref 65–99)
Potassium: 5.4 mmol/L — ABNORMAL HIGH (ref 3.5–5.1)
SODIUM: 137 mmol/L (ref 135–145)

## 2015-12-20 LAB — CATH TIP CULTURE: Culture: NO GROWTH

## 2015-12-20 LAB — PROTIME-INR
INR: 1.61
PROTHROMBIN TIME: 19.3 s — AB (ref 11.4–15.2)

## 2015-12-20 LAB — GLUCOSE, CAPILLARY
GLUCOSE-CAPILLARY: 91 mg/dL (ref 65–99)
GLUCOSE-CAPILLARY: 114 mg/dL — AB (ref 65–99)

## 2015-12-20 LAB — HEPARIN LEVEL (UNFRACTIONATED): HEPARIN UNFRACTIONATED: 0.21 [IU]/mL — AB (ref 0.30–0.70)

## 2015-12-20 SURGERY — REMOVAL PORT-A-CATH
Anesthesia: Monitor Anesthesia Care | Site: Chest | Wound class: Contaminated

## 2015-12-20 MED ORDER — BUPIVACAINE HCL (PF) 0.5 % IJ SOLN
INTRAMUSCULAR | Status: DC | PRN
  Administered 2015-12-20: 27 mL

## 2015-12-20 MED ORDER — ZOLPIDEM TARTRATE 5 MG PO TABS
5.0000 mg | ORAL_TABLET | Freq: Every evening | ORAL | Status: DC | PRN
  Administered 2015-12-20 – 2015-12-27 (×7): 5 mg via ORAL
  Filled 2015-12-20 (×7): qty 1

## 2015-12-20 MED ORDER — HEPARIN BOLUS VIA INFUSION
900.0000 [IU] | Freq: Once | INTRAVENOUS | Status: AC
  Administered 2015-12-21: 900 [IU] via INTRAVENOUS
  Filled 2015-12-20: qty 900

## 2015-12-20 MED ORDER — SODIUM CHLORIDE 0.9% FLUSH: 10.0000 mL | INTRAVENOUS | Status: DC | PRN

## 2015-12-20 MED ORDER — ONDANSETRON HCL 4 MG/2ML IJ SOLN: 4.0000 mg | Freq: Once | INTRAMUSCULAR | Status: DC | PRN

## 2015-12-20 MED ORDER — WARFARIN SODIUM 5 MG PO TABS
5.0000 mg | ORAL_TABLET | ORAL | Status: DC
  Administered 2015-12-22 – 2015-12-23 (×2): 5 mg via ORAL
  Filled 2015-12-20 (×2): qty 1

## 2015-12-20 MED ORDER — PROPOFOL 10 MG/ML IV BOLUS
INTRAVENOUS | Status: DC | PRN
  Administered 2015-12-20: 20 mg via INTRAVENOUS
  Administered 2015-12-20 (×2): 50 mg via INTRAVENOUS

## 2015-12-20 MED ORDER — WARFARIN - PHARMACIST DOSING INPATIENT
Freq: Every day | Status: DC
  Administered 2015-12-22 – 2015-12-26 (×4)

## 2015-12-20 MED ORDER — PHENYLEPHRINE HCL 10 MG/ML IJ SOLN
INTRAMUSCULAR | Status: DC | PRN
  Administered 2015-12-20 (×2): 50 ug via INTRAVENOUS

## 2015-12-20 MED ORDER — BUPIVACAINE HCL (PF) 0.5 % IJ SOLN
INTRAMUSCULAR | Status: AC
  Filled 2015-12-20: qty 30

## 2015-12-20 MED ORDER — FENTANYL CITRATE (PF) 100 MCG/2ML IJ SOLN: 25.0000 ug | INTRAMUSCULAR | Status: DC | PRN

## 2015-12-20 MED ORDER — SODIUM CHLORIDE 0.9% FLUSH: 3.0000 mL | INTRAVENOUS | Status: DC | PRN

## 2015-12-20 MED ORDER — ASPIRIN EC 81 MG PO TBEC
81.0000 mg | DELAYED_RELEASE_TABLET | Freq: Every day | ORAL | Status: DC
  Administered 2015-12-20 – 2015-12-28 (×7): 81 mg via ORAL
  Filled 2015-12-20 (×8): qty 1

## 2015-12-20 MED ORDER — HEPARIN (PORCINE) IN NACL 100-0.45 UNIT/ML-% IJ SOLN
1100.0000 [IU]/h | INTRAMUSCULAR | Status: DC
  Administered 2015-12-20: 900 [IU]/h via INTRAVENOUS
  Administered 2015-12-21 – 2015-12-23 (×3): 1100 [IU]/h via INTRAVENOUS
  Filled 2015-12-20 (×6): qty 250

## 2015-12-20 MED ORDER — HEPARIN SOD (PORK) LOCK FLUSH 100 UNIT/ML IV SOLN
500.0000 [IU] | Freq: Every day | INTRAVENOUS | Status: DC | PRN
  Filled 2015-12-20: qty 5

## 2015-12-20 MED ORDER — WARFARIN SODIUM 7.5 MG PO TABS
7.5000 mg | ORAL_TABLET | ORAL | Status: DC
  Administered 2015-12-20 – 2015-12-21 (×2): 7.5 mg via ORAL
  Filled 2015-12-20: qty 1.5
  Filled 2015-12-20: qty 1

## 2015-12-20 MED ORDER — HEPARIN SOD (PORK) LOCK FLUSH 100 UNIT/ML IV SOLN
250.0000 [IU] | INTRAVENOUS | Status: DC | PRN
  Filled 2015-12-20: qty 3

## 2015-12-20 MED ORDER — PHENOL 1.4 % MT LIQD
1.0000 | OROMUCOSAL | Status: DC | PRN
  Administered 2015-12-20: 1 via OROMUCOSAL
  Filled 2015-12-20 (×2): qty 177

## 2015-12-20 MED ORDER — SODIUM CHLORIDE 0.9 % IV SOLN
INTRAVENOUS | Status: DC | PRN
  Administered 2015-12-20: 07:00:00 via INTRAVENOUS

## 2015-12-20 SURGICAL SUPPLY — 21 items
CANISTER SUCT 1200ML W/VALVE (MISCELLANEOUS) ×2 IMPLANT
CHLORAPREP W/TINT 26ML (MISCELLANEOUS) ×2 IMPLANT
DERMABOND ADVANCED (GAUZE/BANDAGES/DRESSINGS) ×1
DERMABOND ADVANCED .7 DNX12 (GAUZE/BANDAGES/DRESSINGS) ×1 IMPLANT
ELECT REM PT RETURN 9FT ADLT (ELECTROSURGICAL) ×2
ELECTRODE REM PT RTRN 9FT ADLT (ELECTROSURGICAL) ×1 IMPLANT
GLOVE BIOGEL PI IND STRL 7.0 (GLOVE) ×1 IMPLANT
GLOVE BIOGEL PI INDICATOR 7.0 (GLOVE) ×1
GLOVE PI ORTHOPRO 6.5 (GLOVE) ×1
GLOVE PI ORTHOPRO STRL 6.5 (GLOVE) ×1 IMPLANT
GLOVE SURG SYN 6.5 ES PF (GLOVE) ×2 IMPLANT
GOWN STRL REUS W/ TWL LRG LVL3 (GOWN DISPOSABLE) ×2 IMPLANT
GOWN STRL REUS W/TWL LRG LVL3 (GOWN DISPOSABLE) ×2
KIT RM TURNOVER STRD PROC AR (KITS) ×2 IMPLANT
LABEL OR SOLS (LABEL) IMPLANT
PACK PORT-A-CATH (MISCELLANEOUS) ×2 IMPLANT
SUT MNCRL 4-0 (SUTURE) ×1
SUT MNCRL 4-0 27XMFL (SUTURE) ×1
SUT VIC AB 3-0 SH 27 (SUTURE) ×1
SUT VIC AB 3-0 SH 27X BRD (SUTURE) ×1 IMPLANT
SUTURE MNCRL 4-0 27XMF (SUTURE) ×1 IMPLANT

## 2015-12-20 NOTE — Progress Notes (Signed)
Pt doesn't wish to wear Bipap tonight for sleep. Pt says she will call should she change her mind

## 2015-12-20 NOTE — Progress Notes (Signed)
ANTICOAGULATION CONSULT NOTE - Follow Up Consult  Pharmacy Consult for Heparin Drip and warfarin  Indication: hx of VTE  Allergies  Allergen Reactions  . Adhesive [Tape] Itching    Silk tape is ok to use.    Patient Measurements: Height: 5\' 6"  (167.6 cm) Weight:  (uta-bed scale reading >140kg) IBW/kg (Calculated) : 59.3 Heparin Dosing Weight:   Vital Signs: Temp: 98.7 F (37.1 C) (12/13 1959) Temp Source: Oral (12/13 1959) BP: 147/86 (12/13 1959) Pulse Rate: 88 (12/13 1959)  Labs:  Recent Labs  12/18/15 0157  12/18/15 0502 12/18/15 2008 12/19/15 0438 12/20/15 0439 12/20/15 0528 12/20/15 2245  HGB  --   < > 6.1* 7.1* 7.0* 7.4*  --   --   HCT  --   < > 18.3* 21.3* 20.9* 22.2*  --   --   PLT  --   --  119*  --  113* 143*  --   --   LABPROT  --   --  31.0*  --  28.3*  --  19.3*  --   INR  --   --  2.91  --  2.59  --  1.61  --   HEPARINUNFRC  --   --  0.75*  --   --   --   --  0.21*  CREATININE  --   --  3.91*  --  5.60* 3.46*  --   --   TROPONINI 0.15*  --   --   --   --   --   --   --   < > = values in this interval not displayed.  Estimated Creatinine Clearance: 17.8 mL/min (by C-G formula based on SCr of 3.46 mg/dL (H)).   Assessment:  Pharmacy consulted for heparin drip management for 60 yo female transferred to ICU due to flash pulmonary edema and requiring emergent intubation. Pharmacy previously consulted for warfarin management; patient is currently subtherapeutic. Patient has history significant for DVT x 2 and required warfarin as an outpatient. Patient initiated on heparin drip went transitioned to ICU. Heparin drip was held due to therapeutic INR. Port removal today 12/13. INR now subtherapeutic at 1.61.  Pharmacy consulted for heparin drip dosing and monitoring for bridge to therapeutic INR due to PMH of DVT.   Patients home dose:  M,T,W, Th :7.5mg                                      F, Sa, Sun: 5mg     Goal of Therapy:  Anti-Xa 0.3-0.7  INR:  2-3 Monitor platelets by anticoagulation protocol: Yes   Plan:  Heparin 900units/hr. No bolus. Will check Anti-Xa in 6 hours.   Will start patient on home dose of warfarin 7.5mg  M,T,W,Th and 5mg  F,Sat,Sun.   INRs ordered daily. Heparin drip to be stopped after INR therapeutic.   12/13:  HL @ 22:45 = 0.21 .   Will order heparin 900 units IV X 1 bolus and increase drip rate to 1100 units/hr.  Will recheck HL 8 hrs after rate change.   Pharmacy will continue to monitor and adjust per consult.    Orene Desanctis, PharmD, Clinical Pharmacist 12/20/2015 11:51 PM

## 2015-12-20 NOTE — Progress Notes (Signed)
Pharmacy Antibiotic Note  Teresa Cook is a 60 y.o. female admitted on 12/15/2015 with sepsis, being treated for line infection. Pharmacy has been consulted for vancomycin and cefepime dosing.  Plan: 1. Continue Cefepime 1g IV Q24hr.  2. Continue Vancomycin 750mg  to be given at the conclusion of each dialysis session. Trough prior to dialysis on 12/15.   Height: 5\' 6"  (167.6 cm) Weight:  (uta-bed scale reading >140kg) IBW/kg (Calculated) : 59.3  Temp (24hrs), Avg:98.5 F (36.9 C), Min:97.5 F (36.4 C), Max:99.8 F (37.7 C)   Recent Labs Lab 12/15/15 1952 12/16/15 0227 12/17/15 0256 12/17/15 1300 12/17/15 1820 12/18/15 0502 12/19/15 0438 12/20/15 0439  WBC 4.2 4.2 5.0 10.3  --  8.7 6.3 5.2  CREATININE 3.36* 4.08*  --  7.10*  --  3.91* 5.60* 3.46*  LATICACIDVEN 1.1  --   --  1.7 1.7  --   --   --     Estimated Creatinine Clearance: 17.8 mL/min (by C-G formula based on SCr of 3.46 mg/dL (H)).    Allergies  Allergen Reactions  . Adhesive [Tape] Itching    Silk tape is ok to use.    Antimicrobials this admission: Cefepime 12/8 >>  Vancomycin 12/8 >>   Dose adjustments this admission: N/A  Microbiology results: 12/10 BCx: no growth < 24 hours 12/9 Cath Tip Cx: no growth x 2 days  12/8 BCx: no growth x 3  12/8 Urine: no growth  12/8 Sputum: specifmen not acceptable for testing  12/8 MRSA PCR: negative  12/7 BCx Outpt: corynebacterium   Pharmacy will continue to monitor and adjust per consult.   Dani Gobble Kwali Wrinkle 12/20/2015 1:49 PM

## 2015-12-20 NOTE — Progress Notes (Signed)
ANTICOAGULATION CONSULT NOTE - Follow Up Consult  Pharmacy Consult for Heparin Drip and warfarin  Indication: hx of VTE  Allergies  Allergen Reactions  . Adhesive [Tape] Itching    Silk tape is ok to use.    Patient Measurements: Height: 5\' 6"  (167.6 cm) Weight:  (uta-bed scale reading >140kg) IBW/kg (Calculated) : 59.3 Heparin Dosing Weight:   Vital Signs: Temp: 97.8 F (36.6 C) (12/13 1256) Temp Source: Oral (12/13 1256) BP: 127/98 (12/13 1410) Pulse Rate: 91 (12/13 1410)  Labs:  Recent Labs  12/17/15 1820 12/17/15 2216 12/18/15 0157  12/18/15 0502 12/18/15 2008 12/19/15 0438 12/20/15 0439 12/20/15 0528  HGB  --   --   --   < > 6.1* 7.1* 7.0* 7.4*  --   HCT  --   --   --   < > 18.3* 21.3* 20.9* 22.2*  --   PLT  --   --   --   --  119*  --  113* 143*  --   LABPROT  --   --   --   --  31.0*  --  28.3*  --  19.3*  INR  --   --   --   --  2.91  --  2.59  --  1.61  HEPARINUNFRC  --  0.97*  --   --  0.75*  --   --   --   --   CREATININE  --   --   --   --  3.91*  --  5.60* 3.46*  --   TROPONINI 0.11*  --  0.15*  --   --   --   --   --   --   < > = values in this interval not displayed.  Estimated Creatinine Clearance: 17.8 mL/min (by C-G formula based on SCr of 3.46 mg/dL (H)).   Assessment:  Pharmacy consulted for heparin drip management for 60 yo female transferred to ICU due to flash pulmonary edema and requiring emergent intubation. Pharmacy previously consulted for warfarin management; patient is currently subtherapeutic. Patient has history significant for DVT x 2 and required warfarin as an outpatient. Patient initiated on heparin drip went transitioned to ICU. Heparin drip was held due to therapeutic INR. Port removal today 12/13. INR now subtherapeutic at 1.61.  Pharmacy consulted for heparin drip dosing and monitoring for bridge to therapeutic INR due to PMH of DVT.   Patients home dose:  M,T,W, Th :7.5mg                                      F, Sa, Sun:  5mg     Goal of Therapy:  Anti-Xa 0.3-0.7  INR: 2-3 Monitor platelets by anticoagulation protocol: Yes   Plan:  Heparin 900units/hr. No bolus. Will check Anti-Xa in 6 hours.   Will start patient on home dose of warfarin 7.5mg  M,T,W,Th and 5mg  F,Sat,Sun.   INRs ordered daily. Heparin drip to be stopped after INR therapeutic.   Pharmacy will continue to monitor and adjust per consult.    Pernell Dupre, PharmD, BCPS Clinical Pharmacist 12/20/2015 2:21 PM

## 2015-12-20 NOTE — Progress Notes (Signed)
This note also relates to the following rows which could not be included: Pulse Rate - Cannot attach notes to unvalidated device data Resp - Cannot attach notes to unvalidated device data BP - Cannot attach notes to unvalidated device data SpO2 - Cannot attach notes to unvalidated device data  Hd start

## 2015-12-20 NOTE — Op Note (Signed)
Procedure: Left subclavian Port removal  Pre-operative Diagnosis: 1. Port Infection 2. Left breast cancer  Post-operative Diagnosis: same  Surgeon: Susa Griffins, MD  Anesthesia: IV sedation, marcaine .25% w epi and lidocaine 1%  Estimated Blood Loss: Minimal  Findings: minimal murky fluid in the port incision site  Procedure Details  The patient was seen again in the Holding Room. The benefits, complications, treatment options, and expected outcomes were discussed with the patient. The risks of bleeding, infection, recurrence of symptoms, failure to resolve symptoms,  thrombosis nonfunction breakage pneumothorax hemopneumothorax any of which could require chest tube or further surgery were reviewed with the patient.   The patient was taken to Operating Room, identified as Teresa Cook and the procedure verified.  A Time Out was held and the above information confirmed.  Anesthesia sedation was administered and VTE prophylaxis was in place. . The chest was prepped with Chloraprep and draped in the sterile fashion. The patient was positioned in the modified chair position.  The local anesthetic was infiltrated into the skin and subcutaneous tissues in the anterior chest wall.  An incision was made into the port pocket, some murky fluid was seen.  The previous stitches were cut from the port site and port elated to the skin.  A 3-0 Vicryl figure of eight was placed around the catheter and tied down as the port was removed.  The port and catheter was intact and sent for culture.   The wound was closed with interrupted 3-0 Vicryl followed by 4-0 subcuticular Monocryl sutures. Dermabond used to coat the skin  Drains: None         Specimens: None       Complications: None  Disposition:  Patient was taken to the recovery room in stable condition

## 2015-12-20 NOTE — Progress Notes (Signed)
Received from PACU via bed.VSS. Left chest port-a-cath site closed with dermabond. No complaints of pain.

## 2015-12-20 NOTE — Progress Notes (Signed)
Report called to 1C [facility].

## 2015-12-20 NOTE — Progress Notes (Signed)
[place] INFECTIOUS DISEASE PROGRESS NOTE Date of Admission:  12/15/2015     ID: Teresa Cook is a 60 y.o. female with esrd, Beast cancer  Active Problems:   Sepsis ([place])   Healthcare-associated pneumonia   Bloodstream infection due to port-a-cath, initial encounter   Subjective: Port removed this AM, on HD here. Transferring out of unit today  Some cough, mild green sputum  ROS  Eleven systems are reviewed and negative except per hpi  Medications:  Antibiotics Given (last 72 hours)    Date/Time Action Medication Dose Rate   12/17/15 1921 Given   ceFEPIme (MAXIPIME) 1 GM / 68mL IVPB premix 1 g 100 mL/hr   12/18/15 1537 Given   vancomycin (VANCOCIN) IVPB 750 mg/150 ml premix 750 mg 150 mL/hr   12/18/15 1811 Given   ceFEPIme (MAXIPIME) 1 GM / 51mL IVPB premix 1 g 100 mL/hr   12/19/15 1234 Given   vancomycin (VANCOCIN) IVPB 750 mg/150 ml premix 750 mg 150 mL/hr   12/19/15 1703 Given   ceFEPIme (MAXIPIME) 1 GM / 86mL IVPB premix 1 g 100 mL/hr     . aspirin EC  81 mg Oral Daily  . ceFEPIme  1 g Intravenous Daily  . feeding supplement  1 Container Oral TID BM  . ipratropium-albuterol  3 mL Nebulization Q4H  . lidocaine  20 mL Intradermal Once  . losartan  100 mg Oral Once per day on Sun Tue Thu Sat  . mouth rinse  15 mL Mouth Rinse BID  . metoprolol  50 mg Oral 2 times per day on Sun Tue Thu Sat  . pantoprazole  40 mg Oral BID AC  . sertraline  50 mg Oral QHS  . sodium chloride flush  10-40 mL Intracatheter Q12H  . sodium chloride flush  3 mL Intravenous Q12H  . vancomycin  750 mg Intravenous Q M,W,F-HD  . warfarin  7.5 mg Oral Once per day on Mon Tue Wed Thu   And  . [START ON 12/22/2015] warfarin  5 mg Oral Once per day on Sun Fri Sat  . Warfarin - Pharmacist Dosing Inpatient   Does not apply q1800    Objective: Vital signs in last 24 hours: Temp:  [97.5 F (36.4 C)-99.8 F (37.7 C)] 97.8 F (36.6 C) (12/13 1256) Pulse Rate:  [63-120] 76 (12/13  1510) Resp:  [6-34] 23 (12/13 1510) BP: (113-161)/(67-110) 131/89 (12/13 1510) SpO2:  [89 %-100 %] 95 % (12/13 1510) FiO2 (%):  [2 %] 2 % (12/13 1100) Constitutional:  oriented to person, place, and time. Frail, thin  HENT: [place]/AT, PERRLA, no scleral icterus Mouth/Throat: hoarse  Oropharynx is clear and dry. No oropharyngeal exudate.  Cardiovascular: Normal rate, regular rhythm and normal heart sounds.  Pulmonary/Chest: bil rhonchi, bronchial bs Neck = supple, no nuchal rigidity Portacath site wnl Abdominal: Soft. Bowel sounds are normal.  exhibits no distension. There is no tenderness.  Lymphadenopathy: no cervical adenopathy. No axillary adenopathy Neurological: alert and oriented to person, place, and time.  Skin: Skin is warm and dry. No rash noted. No erythema.  Psychiatric: a normal mood and affect.  behavior is normal.   Lab Results  Recent Labs  12/19/15 0438 12/20/15 0439  WBC 6.3 5.2  HGB 7.0* 7.4*  HCT 20.9* 22.2*  NA 134* 137  K 3.5 5.4*  CL 98* 98*  CO2 27 29  BUN 37* 21*  CREATININE 5.60* 3.46*    Microbiology: Results for orders placed or performed during the hospital  encounter of 12/15/15  Blood Culture (routine x 2)     Status: None   Collection Time: 12/15/15  7:52 PM  Result Value Ref Range Status   Specimen Description BLOOD LEFT ASSIST CONTROL  Final   Special Requests   Final    BOTTLES DRAWN AEROBIC AND ANAEROBIC 12CCAERO,14CCANA   Culture NO GROWTH 5 DAYS  Final   Report Status 12/20/2015 FINAL  Final  Blood Culture (routine x 2)     Status: None   Collection Time: 12/15/15  7:53 PM  Result Value Ref Range Status   Specimen Description BLOOD L HAND  Final   Special Requests   Final    BOTTLES DRAWN AEROBIC AND ANAEROBIC AER 11CC, ANA 10CC   Culture NO GROWTH 5 DAYS  Final   Report Status 12/20/2015 FINAL  Final  Urine culture     Status: None   Collection Time: 12/15/15  8:42 PM  Result Value Ref Range Status   Specimen Description URINE,  RANDOM  Final   Special Requests NONE  Final   Culture NO GROWTH Performed at [facility]   Final   Report Status 12/17/2015 FINAL  Final  Culture, sputum-assessment     Status: None   Collection Time: 12/15/15 11:31 PM  Result Value Ref Range Status   Specimen Description EXPECTORATED SPUTUM  Final   Special Requests Immunocompromised  Final   Sputum evaluation   Final    Sputum specimen not acceptable for testing.  Please recollect.   [name] AT 4166 12/16/15.PMH   Report Status 12/16/2015 FINAL  Final  MRSA PCR Screening     Status: None   Collection Time: 12/15/15 11:31 PM  Result Value Ref Range Status   MRSA by PCR NEGATIVE NEGATIVE Final    Comment:        The GeneXpert MRSA Assay (FDA approved for NASAL specimens only), is one component of a comprehensive MRSA colonization surveillance program. It is not intended to diagnose MRSA infection nor to guide or monitor treatment for MRSA infections.   Cath Tip Culture     Status: None   Collection Time: 12/16/15  3:30 PM  Result Value Ref Range Status   Specimen Description HEMODIALYSIS CATHETER  Final   Special Requests NONE  Final   Culture   Final    NO GROWTH 3 DAYS Performed at [facility]    Report Status 12/20/2015 FINAL  Final  MRSA PCR Screening     Status: None   Collection Time: 12/17/15 11:32 AM  Result Value Ref Range Status   MRSA by PCR NEGATIVE NEGATIVE Final    Comment:        The GeneXpert MRSA Assay (FDA approved for NASAL specimens only), is one component of a comprehensive MRSA colonization surveillance program. It is not intended to diagnose MRSA infection nor to guide or monitor treatment for MRSA infections.   Culture, blood (single) w Reflex to ID Panel     Status: None (Preliminary result)   Collection Time: 12/17/15  5:30 PM  Result Value Ref Range Status   Specimen Description PORTA CATH  Final   Special Requests BOTTLES DRAWN AEROBIC AND ANAEROBIC 5ML   Final   Culture NO GROWTH 3 DAYS  Final   Report Status PENDING  Incomplete    Studies/Results: Dg Abd Portable 1v  Result Date: 12/19/2015 CLINICAL DATA:  Abdominal pain, dialysis patient EXAM: PORTABLE ABDOMEN - 1 VIEW COMPARISON:  Chest and abdomen film of  12/17/2015 FINDINGS: The dialysis catheter tip from a right groin approach extends to the L4 level. Left femoral central venous line tip overlies the left SI joint. The bowel gas pattern is nonspecific. The liver appears slightly prominent. IMPRESSION: 1. Right groin central venous line tip extends to L4. 2. Left groin central venous line tip extends to the left SI joint. 3. No bowel obstruction. Electronically Signed   By: Ivar Drape M.D.   On: 12/19/2015 11:24    Assessment/Plan: Nyema Hachey is a 60 y.o. female admitted with cough, ever and weakness as well as otpt HD culture with Corynebacterium.  She has ESRD and is on HD through R chest wall HD cath and has portacath in place. She was on abx at time of admission with augmentin. [place] here negative. No fevers since admit but had acute respiratory decline felt likely due to acute pulmonary edema.  Her HD cath is removed, portacath removed today 12/12..  FU bcx here negative Outpt HD cath  Recommendations Would plan on 10 days after removal of port of vanco (or other abx if sensitivities available Culture from otpt with diptheroids Sensis pending Stop cefepime 12/15 Thank you very much for the consult. Will follow with you.  FITZGERALD, DAVID P   12/20/2015, 3:19 PM

## 2015-12-20 NOTE — Progress Notes (Signed)
This note also relates to the following rows which could not be included: Pulse Rate - Cannot attach notes to unvalidated device data Resp - Cannot attach notes to unvalidated device data SpO2 - Cannot attach notes to unvalidated device data  Post hd vitals

## 2015-12-20 NOTE — Transfer of Care (Signed)
Immediate Anesthesia Transfer of Care Note  Patient: Teresa Cook  Procedure(s) Performed: Procedure(s) with comments: REMOVAL PORT-A-CATH (N/A) - left    Patient Location: PACU  Anesthesia Type:MAC  Level of Consciousness: awake, alert  and oriented  Airway & Oxygen Therapy: Patient Spontanous Breathing and Patient connected to nasal cannula oxygen  Post-op Assessment: Report given to RN  Post vital signs: Reviewed and stable  Last Vitals:  Vitals:   12/20/15 0100 12/20/15 0808  BP: 131/79 (!) 151/83  Pulse: 87 98  Resp: 16 (!) 27  Temp:  36.4 C    Last Pain:  Vitals:   12/19/15 1945  TempSrc: Oral  PainSc:       Patients Stated Pain Goal: 0 (63/81/77 1165)  Complications: No apparent anesthesia complications

## 2015-12-20 NOTE — Interval H&P Note (Signed)
History and Physical Interval Note:  12/20/2015 7:30 AM  Teresa Cook  has presented today for surgery, with the diagnosis of N/A  The various methods of treatment have been discussed with the patient and family. After consideration of risks, benefits and other options for treatment, the patient has consented to  Procedure(s): REMOVAL PORT-A-CATH (N/A) as a surgical intervention .  The patient's history has been reviewed, patient examined, no change in status, stable for surgery.  I have reviewed the patient's chart and labs.  Questions were answered to the patient's satisfaction.     Kristy Catoe L [name]

## 2015-12-20 NOTE — Progress Notes (Signed)
To O.R. via bed

## 2015-12-20 NOTE — Progress Notes (Signed)
Pre dialysis assessment 

## 2015-12-20 NOTE — Progress Notes (Signed)
This note also relates to the following rows which could not be included: Pulse Rate - Cannot attach notes to unvalidated device data Resp - Cannot attach notes to unvalidated device data BP - Cannot attach notes to unvalidated device data SpO2 - Cannot attach notes to unvalidated device data  End of hd

## 2015-12-20 NOTE — Progress Notes (Signed)
[facility]  ROUNDING NOTE   Subjective:    patient remains critically ill 3000 cc Hospital it was removed with hemodialysis yesterday Breathing is more comfortable, off BiPAP but not back to baseline   Objective:  Vital signs in last 24 hours:  Temp:  [97.5 F (36.4 C)-99.8 F (37.7 C)] 97.8 F (36.6 C) (12/13 1256) Pulse Rate:  [63-120] 76 (12/13 1510) Resp:  [6-34] 23 (12/13 1510) BP: (113-161)/(67-110) 131/89 (12/13 1510) SpO2:  [89 %-100 %] 95 % (12/13 1510) FiO2 (%):  [2 %] 2 % (12/13 1100)  Weight change:  Filed Weights   12/17/15 1445  Weight: 76.1 kg (167 lb 12.3 oz)    Intake/Output: I/O last 3 completed shifts: In: 240 [P.O.:240] Out: 3100 [Urine:100; Other:3000]   Intake/Output this shift:  Total I/O In: 200 [P.O.:50; I.V.:150] Out: 0   Physical Exam: General: Critically ill  ENT Moist oral mucus membranes  Eyes: Eyes closed  Neck:  supple  Lungs:  Bilateral rhonchi, crackles  Heart: Irregular, A Fib  Abdomen:  Soft, nontender  Extremities: no peripheral edema.  Neurologic: Alert,    Skin: No lesions  Access:  rt fem temp HD catheter 12/10 Dr. Stevenson Clinch    Basic Metabolic Panel:  Recent Labs Lab 12/15/15 1952 12/16/15 0227 12/17/15 1300 12/17/15 1521 12/18/15 0502 12/18/15 1801 12/19/15 0438 12/20/15 0439  NA 142 143 137  --  137  --  134* 137  K 3.6 3.3* 4.2  --  3.5  --  3.5 5.4*  CL 111 112* 105  --  101  --  98* 98*  CO2 '24 24 23  ' --  29  --  27 29  GLUCOSE 110* 98 153*  --  108*  --  89 104*  BUN 11 13 26*  --  21*  --  37* 21*  CREATININE 3.36* 4.08* 7.10*  --  3.91*  --  5.60* 3.46*  CALCIUM 9.5 9.7 8.0*  --  8.8*  --  8.7* 9.1  MG 1.7  --  1.9 1.8 1.7 2.1  --   --   PHOS 3.3  --  5.1* 2.9 3.4 3.5  --   --     Liver Function Tests:  Recent Labs Lab 12/15/15 1952 12/18/15 0502  AST 27 23  ALT 20 14  ALKPHOS 295* 171*  BILITOT 0.4 0.7  PROT 6.4* 5.3*  ALBUMIN 3.1* 2.6*   No results for input(s):  LIPASE, AMYLASE in the last 168 hours. No results for input(s): AMMONIA in the last 168 hours.  CBC:  Recent Labs Lab 12/15/15 1952  12/17/15 0256 12/17/15 1300 12/18/15 0502 12/18/15 2008 12/19/15 0438 12/20/15 0439  WBC 4.2  < > 5.0 10.3 8.7  --  6.3 5.2  NEUTROABS 3.0  --   --  8.9*  --   --   --  3.6  HGB 7.1*  < > 7.3* 7.7* 6.1* 7.1* 7.0* 7.4*  HCT 21.4*  < > 21.7* 23.1* 18.3* 21.3* 20.9* 22.2*  MCV 86.1  < > 86.5 85.3 85.5  --  83.5 83.0  PLT 142*  < > 142* 178 119*  --  113* 143*  < > = values in this interval not displayed.  Cardiac Enzymes:  Recent Labs Lab 12/17/15 1300 12/17/15 1820 12/18/15 0157  TROPONINI 0.06* 0.11* 0.15*    BNP: Invalid input(s): POCBNP  CBG:  Recent Labs Lab 12/19/15 1208 12/19/15 1615 12/19/15 2006 12/19/15 2357 12/20/15 0408  GLUCAP  74 122* 112* 118* 91    Microbiology: Results for orders placed or performed during the hospital encounter of 12/15/15  Blood Culture (routine x 2)     Status: None   Collection Time: 12/15/15  7:52 PM  Result Value Ref Range Status   Specimen Description BLOOD LEFT ASSIST CONTROL  Final   Special Requests   Final    BOTTLES DRAWN AEROBIC AND ANAEROBIC 12CCAERO,14CCANA   Culture NO GROWTH 5 DAYS  Final   Report Status 12/20/2015 FINAL  Final  Blood Culture (routine x 2)     Status: None   Collection Time: 12/15/15  7:53 PM  Result Value Ref Range Status   Specimen Description BLOOD L HAND  Final   Special Requests   Final    BOTTLES DRAWN AEROBIC AND ANAEROBIC AER 11CC, ANA 10CC   Culture NO GROWTH 5 DAYS  Final   Report Status 12/20/2015 FINAL  Final  Urine culture     Status: None   Collection Time: 12/15/15  8:42 PM  Result Value Ref Range Status   Specimen Description URINE, RANDOM  Final   Special Requests NONE  Final   Culture NO GROWTH Performed at [facility]   Final   Report Status 12/17/2015 FINAL  Final  Culture, sputum-assessment     Status: None   Collection  Time: 12/15/15 11:31 PM  Result Value Ref Range Status   Specimen Description EXPECTORATED SPUTUM  Final   Special Requests Immunocompromised  Final   Sputum evaluation   Final    Sputum specimen not acceptable for testing.  Please recollect.   C/Teresa Cook AT 1610 12/16/15.PMH   Report Status 12/16/2015 FINAL  Final  MRSA PCR Screening     Status: None   Collection Time: 12/15/15 11:31 PM  Result Value Ref Range Status   MRSA by PCR NEGATIVE NEGATIVE Final    Comment:        The GeneXpert MRSA Assay (FDA approved for NASAL specimens only), is one component of a comprehensive MRSA colonization surveillance program. It is not intended to diagnose MRSA infection nor to guide or monitor treatment for MRSA infections.   Cath Tip Culture     Status: None   Collection Time: 12/16/15  3:30 PM  Result Value Ref Range Status   Specimen Description HEMODIALYSIS CATHETER  Final   Special Requests NONE  Final   Culture   Final    NO GROWTH 3 DAYS Performed at [facility]    Report Status 12/20/2015 FINAL  Final  MRSA PCR Screening     Status: None   Collection Time: 12/17/15 11:32 AM  Result Value Ref Range Status   MRSA by PCR NEGATIVE NEGATIVE Final    Comment:        The GeneXpert MRSA Assay (FDA approved for NASAL specimens only), is one component of a comprehensive MRSA colonization surveillance program. It is not intended to diagnose MRSA infection nor to guide or monitor treatment for MRSA infections.   Culture, blood (single) w Reflex to ID Panel     Status: None (Preliminary result)   Collection Time: 12/17/15  5:30 PM  Result Value Ref Range Status   Specimen Description PORTA CATH  Final   Special Requests BOTTLES DRAWN AEROBIC AND ANAEROBIC 5ML  Final   Culture NO GROWTH 3 DAYS  Final   Report Status PENDING  Incomplete    Coagulation Studies:  Recent Labs  12/18/15 0502 12/19/15 0438 12/20/15 9604  LABPROT 31.0* 28.3* 19.3*  INR 2.91 2.59  1.61    Urinalysis: No results for input(s): COLORURINE, LABSPEC, PHURINE, GLUCOSEU, HGBUR, BILIRUBINUR, KETONESUR, PROTEINUR, UROBILINOGEN, NITRITE, LEUKOCYTESUR in the last 72 hours.  Invalid input(s): APPERANCEUR    Imaging: Dg Abd Portable 1v  Result Date: 12/19/2015 CLINICAL DATA:  Abdominal pain, dialysis patient EXAM: PORTABLE ABDOMEN - 1 VIEW COMPARISON:  Chest and abdomen film of 12/17/2015 FINDINGS: The dialysis catheter tip from a right groin approach extends to the L4 level. Left femoral central venous line tip overlies the left SI joint. The bowel gas pattern is nonspecific. The liver appears slightly prominent. IMPRESSION: 1. Right groin central venous line tip extends to L4. 2. Left groin central venous line tip extends to the left SI joint. 3. No bowel obstruction. Electronically Signed   By: Ivar Drape M.D.   On: 12/19/2015 11:24     Medications:   . heparin     . aspirin EC  81 mg Oral Daily  . ceFEPIme  1 g Intravenous Daily  . feeding supplement  1 Container Oral TID BM  . ipratropium-albuterol  3 mL Nebulization Q4H  . lidocaine  20 mL Intradermal Once  . losartan  100 mg Oral Once per day on Sun Tue Thu Sat  . mouth rinse  15 mL Mouth Rinse BID  . metoprolol  50 mg Oral 2 times per day on Sun Tue Thu Sat  . pantoprazole  40 mg Oral BID AC  . sertraline  50 mg Oral QHS  . sodium chloride flush  10-40 mL Intracatheter Q12H  . sodium chloride flush  3 mL Intravenous Q12H  . vancomycin  750 mg Intravenous Q M,W,F-HD  . warfarin  7.5 mg Oral Once per day on Mon Tue Wed Thu   And  . [START ON 12/22/2015] warfarin  5 mg Oral Once per day on Sun Fri Sat  . Warfarin - Pharmacist Dosing Inpatient   Does not apply q1800   acetaminophen **OR** acetaminophen, benzonatate, bisacodyl, diphenhydrAMINE, heparin lock flush, heparin lock flush, HYDROcodone-acetaminophen, ipratropium-albuterol, metoprolol, senna-docusate, sodium chloride flush, sodium chloride flush, sodium  chloride flush  Assessment/ Plan:  [name] is a 60 y.o. 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  1. ESRD on HD MWF: with acute pulmonary edema today Emergent HD on Sunday. 1100 cc fluid removed Now has rt fem temp dialysis cathter Extra hemodialysis treatment today for volume optimization  2. Bacteremia/sepsis: 12/9 gram positive bacilli from [place].- Corynebacterium Permcath removed. Blood cultures and cath tip cultures are no growth  chemotherapy infusion port removed today  3. Anemia of chronic kidney disease: hemoglobin 7 - Avoid Epogen given recent diagnosis of breast cancer. - Appreciate oncology input.  - blood transfusion given this admission  4. Secondary hyperparathyroidism. -  hold cinacalcet and sevelamer for now     LOS: 5 Teresa Cook 12/13/20173:12 PM

## 2015-12-20 NOTE — Progress Notes (Signed)
Pre Dialysis 

## 2015-12-20 NOTE — Progress Notes (Signed)
Transferred to 214 via bed. Family aware.

## 2015-12-20 NOTE — Progress Notes (Signed)
Electrolyte Monitoring:    Pharmacy consulted for electrolyte monitoring for 60 yo female ICU patient.   Potassium elevated at 5.4. HD usually MWF, Losartan ordered Sun, Tue, Thu Sat. Recommend holding Thursday dose if K+ still elevated.   No replacement warranted at this time. Will recheck electrolytes on 12/13.   Pharmacy will continue to monitor and adjust per protocol.   Pernell Dupre, PharmD, BCPS Clinical Pharmacist 12/20/2015 2:35 PM

## 2015-12-20 NOTE — Progress Notes (Signed)
[facility] [place] NAME: Teresa Cook    MR#:  [id number]  DATE OF BIRTH:  24-Jun-1955  SUBJECTIVE: Feels better,. on 2 L of oxygen, saturating 97%. Patient breathing comfortably today. Denies any other complaints. Getting  hemodialysis.   CHIEF COMPLAINT:   Chief Complaint  Patient presents with  . Weakness    REVIEW OF SYSTEMS:   ROS CONSTITUTIONAL: No fever, fatigue or weakness.  EYES: No blurred or double vision.  EARS, NOSE, AND THROAT: No tinnitus or ear pain.  RESPIRATORY:Less shortnessof breath today. CARDIOVASCULAR: No chest pain, orthopnea, edema.  GASTROINTESTINAL: No nausea, vomiting, diarrhea mild abdominal pain, constipation.  GENITOURINARY: Hemodialysis.  ENDOCRINE: No polyuria, nocturia,  HEMATOLOGY: No anemia, easy bruising or bleeding SKIN: No rash or lesion. MUSCULOSKELETAL: No joint pain or arthritis.   NEUROLOGIC: No tingling, numbness, weakness.  PSYCHIATRY: No anxiety or depression.   DRUG ALLERGIES:   Allergies  Allergen Reactions  . Adhesive [Tape] Itching    Silk tape is ok to use.    VITALS:  Blood pressure (!) 153/85, pulse 81, temperature 97.8 F (36.6 C), temperature source Oral, resp. rate (!) 23, height 5\' 6"  (1.676 m), weight 76.1 kg (167 lb 12.3 oz), SpO2 96 %.  PHYSICAL EXAMINATION:  GENERAL:  60 y.o.-year-old patient lying in the bed with no acute distress.  EYES: Pupils equal, round, reactive to light and accommodation. No scleral icterus. Extraocular muscles intact.  HEENT: Head atraumatic, normocephalic. Oropharynx and nasopharynx clear.  NECK:  Supple, no jugular venous distention. No thyroid enlargement, no tenderness.  LUNGS: mostly  clear to auscultation, no wheezing, no rales.  CARDIOVASCULAR: S1, S2 normal. No murmurs, rubs, or gallops.  ABDOMEN: Soft, nontender, nondistended. Bowel sounds present. No organomegaly or mass.  EXTREMITIES: No pedal edema, cyanosis, or  clubbing.  NEUROLOGIC: Cranial nerves II through XII are intact. Muscle strength 5/5 in all extremities. Sensation intact. Gait not checked.  PSYCHIATRIC: The patient is alert and oriented x 3.  SKIN: No obvious rash, lesion, or ulcer.    LABORATORY PANEL:   CBC  Recent Labs Lab 12/20/15 0439  WBC 5.2  HGB 7.4*  HCT 22.2*  PLT 143*   ------------------------------------------------------------------------------------------------------------------  Chemistries   Recent Labs Lab 12/18/15 0502 12/18/15 1801  12/20/15 0439  NA 137  --   < > 137  K 3.5  --   < > 5.4*  CL 101  --   < > 98*  CO2 29  --   < > 29  GLUCOSE 108*  --   < > 104*  BUN 21*  --   < > 21*  CREATININE 3.91*  --   < > 3.46*  CALCIUM 8.8*  --   < > 9.1  MG 1.7 2.1  --   --   AST 23  --   --   --   ALT 14  --   --   --   ALKPHOS 171*  --   --   --   BILITOT 0.7  --   --   --   < > = values in this interval not displayed. ------------------------------------------------------------------------------------------------------------------  Cardiac Enzymes  Recent Labs Lab 12/18/15 0157  TROPONINI 0.15*   ------------------------------------------------------------------------------------------------------------------  RADIOLOGY:  Dg Abd Portable 1v  Result Date: 12/19/2015 CLINICAL DATA:  Abdominal pain, dialysis patient EXAM: PORTABLE ABDOMEN - 1 VIEW COMPARISON:  Chest and abdomen film of 12/17/2015 FINDINGS: The dialysis catheter tip from a  right groin approach extends to the L4 level. Left femoral central venous line tip overlies the left SI joint. The bowel gas pattern is nonspecific. The liver appears slightly prominent. IMPRESSION: 1. Right groin central venous line tip extends to L4. 2. Left groin central venous line tip extends to the left SI joint. 3. No bowel obstruction. Electronically Signed   By: Ivar Drape M.D.   On: 12/19/2015 11:24    EKG:   Orders placed or performed during the  hospital encounter of 12/15/15  . ED EKG  . ED EKG  . EKG 12-Lead  . EKG 12-Lead  . EKG 12-Lead  . EKG 12-Lead    ASSESSMENT AND PLAN:   .Acute .Respiratory failure secondary to combination of fluid overload, multifocal pneumonia. Status post extubation . Patient respiratory failure resolved, now on 2 L of oxygen, saturation 97%. Transfer the patient out of ICU today.  2. Bacteremia, sepsis: Blood cultures from 12 /9 from HD center diphtheroids... Continue vancomycin, cefepime. procalcitonin level 89.   Cultures from 12/10 no growth, his permacath removal, per catheter tip cultures from December 9 didn't show any growth.  3.Anemia of chronic kidney disease. Hemoglobin dropped yesterday, received one transfusion. hemoglobin improved to 7 today.    #4 .history of breast cancer finished chemotherapy. Port is removed. #5 essential hypertension  #6 history of left leg DVT: Because of anemia stopped  Warfarin. Due to anemia. Transmitted 1 unit of packed RBC, hemoglobin stable at 7.4. #7 tachycardia: Better: continue beta blockers.   #8 hypertensive urgency secondary to flash pulmonary edema: Improved with hemodialysis. Patient received emergency hemodialysis on 12/10; Nephrology, ID, pulmonary, oncology following the patient. Transfer out of ICU today. Physical therapy consult.   All the records are reviewed and case discussed with Care Management/Social Workerr. Management plans discussed with the patient, family and they are in agreement.  CODE STATUS: full  TOTAL TIME TAKING CARE OF THIS PATIENT: 40 min  POSSIBLE D/C IN 4-5 DAYS, DEPENDING ON CLINICAL CONDITION.   Epifanio Lesches M.D on 12/20/2015 at 4:38 PM  Between 7am to 6pm - Pager - [phone]  After 6pm go to [email or web address] - password [email or web address] [facility]  Office  [phone]  CC: Primary care physician; Murlean Iba, MD   Note: This dictation was prepared with Dragon dictation along  with smaller phrase technology. Any transcriptional errors that result from this process are unintentional.

## 2015-12-20 NOTE — Anesthesia Preprocedure Evaluation (Signed)
Anesthesia Evaluation  Patient identified by MRN, date of birth, ID band Patient awake    Reviewed: Allergy & Precautions, H&P , NPO status , Patient's Chart, lab work & pertinent test results, reviewed documented beta blocker date and time   Airway Mallampati: II  TM Distance: >3 FB Neck ROM: full    Dental no notable dental hx. (+) Teeth Intact   Pulmonary neg pulmonary ROS, shortness of breath and lying, pneumonia, unresolved,    Pulmonary exam normal breath sounds clear to auscultation       Cardiovascular Exercise Tolerance: Good hypertension, + Peripheral Vascular Disease and +CHF  negative cardio ROS   Rhythm:regular Rate:Normal     Neuro/Psych Seizures -,  PSYCHIATRIC DISORDERS negative neurological ROS  negative psych ROS   GI/Hepatic negative GI ROS, Neg liver ROS,   Endo/Other  negative endocrine ROSdiabetes  Renal/GU Renal disease     Musculoskeletal   Abdominal   Peds  Hematology negative hematology ROS (+) anemia ,   Anesthesia Other Findings Pt placed on Bipap this am.  Will attempt to proceed with MAC if tolerated and plan for GOT if necessary.    Reproductive/Obstetrics negative OB ROS                             Anesthesia Physical Anesthesia Plan  ASA: IV and emergent  Anesthesia Plan: MAC   Post-op Pain Management:    Induction:   Airway Management Planned:   Additional Equipment:   Intra-op Plan:   Post-operative Plan:   Informed Consent: I have reviewed the patients History and Physical, chart, labs and discussed the procedure including the risks, benefits and alternatives for the proposed anesthesia with the patient or authorized representative who has indicated his/her understanding and acceptance.     Plan Discussed with: CRNA  Anesthesia Plan Comments:         Anesthesia Quick Evaluation

## 2015-12-20 NOTE — Progress Notes (Signed)
PULMONARY / CRITICAL CARE MEDICINE   Name: Teresa Cook MRN: [id number] DOB: 06/19/1955    ADMISSION DATE:  12/15/2015  BRIEF HISTORY: 60 y.o.femalewith a known history of End-stage renal disease on hemodialysis, CHF, DVT of hypertension, breast cancerpresents to the emergency department complaining of cough, congestion and fever on 12/15/15. Patient was in a usual state of health until one week ago when she developed worsening symptoms of chest congestion associated with cough productive of green sputum and fevers to 101 at home. She reports chest tightness and pain associated with coughing. She has been taking Augmentin for the past 3 days but has been worsening..  Patient finished chemotherapy in November 2017 and is planned for surgery in January 2018. Patient has dialysis Monday Wednesday and Friday. Admitted by hospitalist service, had suspicion of line infection (either RIJ per cath, or Left port-a-cath), 12/9 had permcath removed, on 12/10 To have high blood pressure, systolic in the 808U, complained about difficulty breathing, evaluated by nursing staff found to be in respiratory distress oxygen saturation initially was 90% with acutely dropped to 60s, rapid response was called, primary care team was also there, Lasix 40 mg as given per physician order, patient continued to be in respiratory distress she was transferred to the ICU, where she was noted to have white foam and frothing at the mouth at times, she was emergently intubated and placed on mechanical ventilation. Off note, she was scheduled to have her chemotherapy port removed on Monday, 12/18/2015.  SUBJECTIVE:  On 4L [place], did well after HD yesterday, another session of HD today.    SIGNIFICANT EVENTS: 12/8> admitted for suspected line infection (either RIJ per cath, or Left port-a-cath) 12/9>R permcath removed 12/10> acute hypoxic respiratory failure, flash pulmonary edema, HTN, hypoxia, intubated.  12/11>  extubated 12/12>intermittent bipap 12/13>L port-a-cath removed, murky material around port per surgery, sent for culture.   VITAL SIGNS: Temp:  [97.5 F (36.4 C)-99.8 F (37.7 C)] 97.8 F (36.6 C) (12/13 1256) Pulse Rate:  [77-120] 86 (12/13 1256) Resp:  [6-34] 23 (12/13 1256) BP: (113-160)/(64-110) 125/75 (12/13 1256) SpO2:  [92 %-100 %] 94 % (12/13 1256) HEMODYNAMICS:   VENTILATOR SETTINGS:   INTAKE / OUTPUT:  Intake/Output Summary (Last 24 hours) at 12/20/15 1314 Last data filed at 12/20/15 0855  Gross per 24 hour  Intake              390 ml  Output             3000 ml  Net            -2610 ml    Review of Systems  Constitutional: Negative for chills and fever.  HENT: Positive for sore throat.   Eyes: Negative for blurred vision.  Respiratory: Positive for cough and shortness of breath. Negative for sputum production.   Cardiovascular: Negative for chest pain.  Gastrointestinal: Negative for heartburn and nausea.  Genitourinary: Negative for dysuria.  Musculoskeletal: Negative for myalgias.  Neurological: Negative for dizziness.  Endo/Heme/Allergies: Does not bruise/bleed easily.    Physical Exam  Constitutional: She is oriented to person, place, and time and well-developed, well-nourished, and in no distress. No distress.  Eyes: Conjunctivae and EOM are normal. Pupils are equal, round, and reactive to light.  Neck: Normal range of motion. Neck supple.  Cardiovascular:  Regular, tachycardia  Pulmonary/Chest: No respiratory distress. She has no wheezes.  Coarse upper airway sounds BS much improved from yesterday   Abdominal: Soft. Bowel sounds are normal. She exhibits  no distension. There is no tenderness.  Musculoskeletal: Normal range of motion.  Neurological: She is alert and oriented to person, place, and time.  Skin: Skin is warm and dry. She is not diaphoretic.  Nursing note and vitals reviewed.    LABS:  CBC  Recent Labs Lab 12/18/15 0502  12/18/15 2008 12/19/15 0438 12/20/15 0439  WBC 8.7  --  6.3 5.2  HGB 6.1* 7.1* 7.0* 7.4*  HCT 18.3* 21.3* 20.9* 22.2*  PLT 119*  --  113* 143*   Coag's  Recent Labs Lab 12/17/15 1300 12/18/15 0502 12/19/15 0438 12/20/15 0528  APTT 35  --   --   --   INR  --  2.91 2.59 1.61   BMET  Recent Labs Lab 12/18/15 0502 12/19/15 0438 12/20/15 0439  NA 137 134* 137  K 3.5 3.5 5.4*  CL 101 98* 98*  CO2 29 27 29   BUN 21* 37* 21*  CREATININE 3.91* 5.60* 3.46*  GLUCOSE 108* 89 104*   Electrolytes  Recent Labs Lab 12/17/15 1521 12/18/15 0502 12/18/15 1801 12/19/15 0438 12/20/15 0439  CALCIUM  --  8.8*  --  8.7* 9.1  MG 1.8 1.7 2.1  --   --   PHOS 2.9 3.4 3.5  --   --    Sepsis Markers  Recent Labs Lab 12/15/15 1952 12/17/15 1300 12/17/15 1820 12/18/15 0502 12/19/15 0438  LATICACIDVEN 1.1 1.7 1.7  --   --   PROCALCITON  --  1.58  --  64.24 89.00   ABG  Recent Labs Lab 12/17/15 1200 12/18/15 0500  PHART 7.32* 7.54*  PCO2ART 43 35  PO2ART 165* 126*   Liver Enzymes  Recent Labs Lab 12/15/15 1952 12/18/15 0502  AST 27 23  ALT 20 14  ALKPHOS 295* 171*  BILITOT 0.4 0.7  ALBUMIN 3.1* 2.6*   Cardiac Enzymes  Recent Labs Lab 12/17/15 1300 12/17/15 1820 12/18/15 0157  TROPONINI 0.06* 0.11* 0.15*   Glucose  Recent Labs Lab 12/19/15 0724 12/19/15 1208 12/19/15 1615 12/19/15 2006 12/19/15 2357 12/20/15 0408  GLUCAP 87 74 122* 112* 118* 91    Imaging No results found. Cultures: BCx2 12/8 UC  Sputum Permcath tip culture 12/9 Port-a-cath culture 12/10  Antibiotics: Vanc 12/8> Cefepime 12/8>  Lines: Vascath Fem CVL Fem  DISCUSSION: 60 year old female past medical history of end-stage renal disease on hemodialysis, CHF, DVT, hypertension, breast cancer, last chemotherapy treatment was in November, initially presented on 12/8 with complaint of coughing, congestion and fever, admitted by hospitalist team for suspected sepsis from  underlying line infection. On 12/10, noted to be in severe respiratory distress with flash pulmonary edema and hypertension, emergently intubated and transferred to the ICU. Extubated on 12/11, doing well from a respiratory standpoint.   ASSESSMENT / PLAN: PULMONARY Acute hypoxic respiratory failure-resolved Flash pulmonary edema- improving Right greater than left lung infiltrates, most likely fluid- now improving Respiratory distress P:   Extubated 12/11 Keep O2 sats greater than 88% CXR 12/11 reviewed - already with clearing of right sided infiltrates - most likely asymmetric edema from flash pulmonary edema, especially given rapid clearing of infiltrates in 1 day.  Would cont with above abx for suspected line/port infection.   CARDIOVASCULAR A: HTN urgency-resolving Mild troponin elevation P:  Will cont with BP meds Mild troponin elevation - possibly demand ischemia Can restart PO HTN meds, slowly  RENAL ESRD on HD P:   RIJ permcath removed due to suspicion on line infection on 12/9 HD today per  nephro Nephro following  GASTROINTESTINAL PUD  HEMATOLOGIC Hx of Breast Ca Hx of Left Leg DVT on warfarin P:  Now s/p chemo in November, Spoke with Heme/onc  - no further chemo planned, okay to remove port Stop warfarin, will start hep gtt - can resume after removal of port-a-cath Monitor INR   INFECTIOUS Sepsis - 2/2 to possible port or permcath infection  P:   -cont with above abx -f\u on cultures -per Dr. Lorenso Courier, obtain a culture from left port-a-cath prior to removal, permath on R removed 12/9 PCT 1.58>64.24>89, now with clinical improvement.  -L port-a-cath removed today, surgery stated there was a small pocket or "murky" material around the port, it was sent of culture  ENDOCRINE ICU hypo/hyperglycemia protocol  NEUROLOGIC No issues   Thank you for consulting [facility], we will signoff at this time.  Please feel free to  contact us with any questions at [phone] (please enter 7-digits).   I have personally obtained a history, examined the patient, evaluated laboratory and imaging results, formulated the assessment and plan and placed orders.  Pulmonary Care Time devoted to patient care services described in this note is 35 minutes.   Vilinda Boehringer, MD [facility] Pager [phone] (please enter 7-digits) On Call Pager - [phone] (please enter 7-digits   Note: This note was prepared with Dragon dictation along with smaller phrase technology. Any transcriptional errors that result from this process are unintentional.

## 2015-12-20 NOTE — Progress Notes (Signed)
Post hd assessment 

## 2015-12-21 ENCOUNTER — Inpatient Hospital Stay: Payer: Medicare Other

## 2015-12-21 LAB — BASIC METABOLIC PANEL
ANION GAP: 8 (ref 5–15)
BUN: 17 mg/dL (ref 6–20)
CO2: 29 mmol/L (ref 22–32)
Calcium: 9.2 mg/dL (ref 8.9–10.3)
Chloride: 102 mmol/L (ref 101–111)
Creatinine, Ser: 3.41 mg/dL — ABNORMAL HIGH (ref 0.44–1.00)
GFR calc Af Amer: 16 mL/min — ABNORMAL LOW (ref >60)
GFR, EST NON AFRICAN AMERICAN: 14 mL/min — AB (ref >60)
GLUCOSE: 97 mg/dL (ref 65–99)
POTASSIUM: 3 mmol/L — AB (ref 3.5–5.1)
Sodium: 139 mmol/L (ref 135–145)

## 2015-12-21 LAB — CBC
HEMATOCRIT: 21.6 % — AB (ref 35.0–47.0)
Hemoglobin: 7.2 g/dL — ABNORMAL LOW (ref 12.0–16.0)
MCH: 28.1 pg (ref 26.0–34.0)
MCHC: 33.6 g/dL (ref 32.0–36.0)
MCV: 83.7 fL (ref 80.0–100.0)
PLATELETS: 143 10*3/uL — AB (ref 150–440)
RBC: 2.58 MIL/uL — AB (ref 3.80–5.20)
RDW: 17.5 % — ABNORMAL HIGH (ref 11.5–14.5)
WBC: 3.8 10*3/uL (ref 3.6–11.0)

## 2015-12-21 LAB — PROTIME-INR
INR: 1.33
Prothrombin Time: 16.6 seconds — ABNORMAL HIGH (ref 11.4–15.2)

## 2015-12-21 LAB — HEPARIN LEVEL (UNFRACTIONATED)
HEPARIN UNFRACTIONATED: 0.32 [IU]/mL (ref 0.30–0.70)
Heparin Unfractionated: 0.43 IU/mL (ref 0.30–0.70)

## 2015-12-21 LAB — GLUCOSE, CAPILLARY
GLUCOSE-CAPILLARY: 105 mg/dL — AB (ref 65–99)
Glucose-Capillary: 92 mg/dL (ref 65–99)

## 2015-12-21 MED ORDER — POTASSIUM CHLORIDE CRYS ER 20 MEQ PO TBCR
40.0000 meq | EXTENDED_RELEASE_TABLET | ORAL | Status: AC
  Administered 2015-12-21 (×2): 40 meq via ORAL
  Filled 2015-12-21 (×2): qty 2

## 2015-12-21 NOTE — Progress Notes (Signed)
ANTICOAGULATION CONSULT NOTE - Follow Up Consult  Pharmacy Consult for Heparin Drip and warfarin  Indication: hx of VTE  Allergies  Allergen Reactions  . Adhesive [Tape] Itching    Silk tape is ok to use.    Patient Measurements: Height: 5\' 6"  (167.6 cm) Weight:  (uta-bed scale reading >140kg) IBW/kg (Calculated) : 59.3 Heparin Dosing Weight:   Vital Signs: Temp: 98.2 F (36.8 C) (12/14 1210) Temp Source: Oral (12/14 1210) BP: 142/89 (12/14 1210) Pulse Rate: 90 (12/14 1210)  Labs:  Recent Labs  12/19/15 0438 12/20/15 0439 12/20/15 0528 12/20/15 2245 12/21/15 0607 12/21/15 0855 12/21/15 1516  HGB 7.0* 7.4*  --   --  7.2*  --   --   HCT 20.9* 22.2*  --   --  21.6*  --   --   PLT 113* 143*  --   --  143*  --   --   LABPROT 28.3*  --  19.3*  --  16.6*  --   --   INR 2.59  --  1.61  --  1.33  --   --   HEPARINUNFRC  --   --   --  0.21*  --  0.43 0.32  CREATININE 5.60* 3.46*  --   --  3.41*  --   --     Estimated Creatinine Clearance: 18.1 mL/min (by C-G formula based on SCr of 3.41 mg/dL (H)).   Assessment:  Pharmacy consulted for heparin drip management for 60 yo female transferred to ICU due to flash pulmonary edema and requiring emergent intubation. Pharmacy previously consulted for warfarin management; patient is currently subtherapeutic. Patient has history significant for DVT x 2 and required warfarin as an outpatient. Patient initiated on heparin drip went transitioned to ICU. Heparin drip was held due to therapeutic INR. Port removal today 12/13. INR now subtherapeutic at 1.61.  Pharmacy consulted for heparin drip dosing and monitoring for bridge to therapeutic INR due to PMH of DVT.   Patients home dose:  M,T,W, Th :7.5mg                                      F, Sa, Sun: 5mg     DATE:          INR:            DOSE  12/13            1.61        7.5mg  12/14          1.33      7.5mg   12/15     Goal of Therapy:  Anti-Xa 0.3-0.7  INR: 2-3 Monitor platelets  by anticoagulation protocol: Yes   Plan:  Heparin 900units/hr. No bolus. Will check Anti-Xa in 6 hours.   Will start patient on home dose of warfarin 7.5mg  M,T,W,Th and 5mg  F,Sat,Sun.   INRs ordered daily. Heparin drip to be stopped after INR therapeutic.   12/13:  HL @ 22:45 = 0.21 .   Will order heparin 900 units IV X 1 bolus and increase drip rate to 1100 units/hr.  Will recheck HL 8 hrs after rate change.   12/14 HL: 0.43 . Will continue heparin drip at 1100 units/hr. Will recheck heparin level in 6 hours for confirmation. INR subtherapeutic at 1.33. Will continue with patients home dose of 7.5mg  today. Expect INR to start trending up in next 48 hours.  12/14 HL: 0.32. This is the second level with in therapeutic range. Will continue heparin drip at 1100 units/hr. HL and CBC ordered with AM labs.   Pharmacy will continue to monitor and adjust per consult.    Pernell Dupre, PharmD, BCPS Clinical Pharmacist 12/21/2015 4:12 PM

## 2015-12-21 NOTE — Progress Notes (Signed)
[facility]  ROUNDING NOTE   Subjective:    patient feels little better today. Out of ICU Breathing is more comfortable, off BiPAP but not back to baseline Another 2500 cc removed with HD yesterday So far more than 6500 cc of fluid has been removed    Objective:  Vital signs in last 24 hours:  Temp:  [98 F (36.7 C)-99 F (37.2 C)] 98.2 F (36.8 C) (12/14 1210) Pulse Rate:  [88-115] 90 (12/14 1210) Resp:  [17-34] 18 (12/14 1210) BP: (142-181)/(76-99) 142/89 (12/14 1210) SpO2:  [90 %-100 %] 95 % (12/14 1210) FiO2 (%):  [35 %] 35 % (12/14 0503)  Weight change:  Filed Weights   12/17/15 1445  Weight: 76.1 kg (167 lb 12.3 oz)    Intake/Output: I/O last 3 completed shifts: In: 823 [P.O.:530; I.V.:293] Out: 2500 [Other:2500]   Intake/Output this shift:  No intake/output data recorded.  Physical Exam: General: chronically ill  ENT Moist oral mucus membranes  Eyes: Eyes closed  Neck:  supple  Lungs:  Bilateral rhonchi, crackles  Heart: Irregular, A Fib  Abdomen:  Soft, nontender  Extremities: no peripheral edema.  Neurologic: Alert,    Skin: No lesions  Access:  rt fem temp HD catheter 12/10 Dr. Stevenson Clinch    Basic Metabolic Panel:  Recent Labs Lab 12/15/15 1952  12/17/15 1300 12/17/15 1521 12/18/15 0502 12/18/15 1801 12/19/15 0438 12/20/15 0439 12/21/15 0607  NA 142  < > 137  --  137  --  134* 137 139  K 3.6  < > 4.2  --  3.5  --  3.5 5.4* 3.0*  CL 111  < > 105  --  101  --  98* 98* 102  CO2 24  < > 23  --  29  --  '27 29 29  ' GLUCOSE 110*  < > 153*  --  108*  --  89 104* 97  BUN 11  < > 26*  --  21*  --  37* 21* 17  CREATININE 3.36*  < > 7.10*  --  3.91*  --  5.60* 3.46* 3.41*  CALCIUM 9.5  < > 8.0*  --  8.8*  --  8.7* 9.1 9.2  MG 1.7  --  1.9 1.8 1.7 2.1  --   --   --   PHOS 3.3  --  5.1* 2.9 3.4 3.5  --   --   --   < > = values in this interval not displayed.  Liver Function Tests:  Recent Labs Lab 12/15/15 1952 12/18/15 0502  AST  27 23  ALT 20 14  ALKPHOS 295* 171*  BILITOT 0.4 0.7  PROT 6.4* 5.3*  ALBUMIN 3.1* 2.6*   No results for input(s): LIPASE, AMYLASE in the last 168 hours. No results for input(s): AMMONIA in the last 168 hours.  CBC:  Recent Labs Lab 12/15/15 1952  12/17/15 1300 12/18/15 0502 12/18/15 2008 12/19/15 0438 12/20/15 0439 12/21/15 0607  WBC 4.2  < > 10.3 8.7  --  6.3 5.2 3.8  NEUTROABS 3.0  --  8.9*  --   --   --  3.6  --   HGB 7.1*  < > 7.7* 6.1* 7.1* 7.0* 7.4* 7.2*  HCT 21.4*  < > 23.1* 18.3* 21.3* 20.9* 22.2* 21.6*  MCV 86.1  < > 85.3 85.5  --  83.5 83.0 83.7  PLT 142*  < > 178 119*  --  113* 143* 143*  < > = values  in this interval not displayed.  Cardiac Enzymes:  Recent Labs Lab 12/17/15 1300 12/17/15 1820 12/18/15 0157  TROPONINI 0.06* 0.11* 0.15*    BNP: Invalid input(s): POCBNP  CBG:  Recent Labs Lab 12/19/15 2357 12/20/15 0408 12/20/15 1958 12/21/15 0118 12/21/15 0439  GLUCAP 118* 91 114* 92 105*    Microbiology: Results for orders placed or performed during the hospital encounter of 12/15/15  Blood Culture (routine x 2)     Status: None   Collection Time: 12/15/15  7:52 PM  Result Value Ref Range Status   Specimen Description BLOOD LEFT ASSIST CONTROL  Final   Special Requests   Final    BOTTLES DRAWN AEROBIC AND ANAEROBIC 12CCAERO,14CCANA   Culture NO GROWTH 5 DAYS  Final   Report Status 12/20/2015 FINAL  Final  Blood Culture (routine x 2)     Status: None   Collection Time: 12/15/15  7:53 PM  Result Value Ref Range Status   Specimen Description BLOOD L HAND  Final   Special Requests   Final    BOTTLES DRAWN AEROBIC AND ANAEROBIC AER 11CC, ANA 10CC   Culture NO GROWTH 5 DAYS  Final   Report Status 12/20/2015 FINAL  Final  Urine culture     Status: None   Collection Time: 12/15/15  8:42 PM  Result Value Ref Range Status   Specimen Description URINE, RANDOM  Final   Special Requests NONE  Final   Culture NO GROWTH Performed at [facility]   Final   Report Status 12/17/2015 FINAL  Final  Culture, sputum-assessment     Status: None   Collection Time: 12/15/15 11:31 PM  Result Value Ref Range Status   Specimen Description EXPECTORATED SPUTUM  Final   Special Requests Immunocompromised  Final   Sputum evaluation   Final    Sputum specimen not acceptable for testing.  Please recollect.   [name] AT 5638 12/16/15.PMH   Report Status 12/16/2015 FINAL  Final  MRSA PCR Screening     Status: None   Collection Time: 12/15/15 11:31 PM  Result Value Ref Range Status   MRSA by PCR NEGATIVE NEGATIVE Final    Comment:        The GeneXpert MRSA Assay (FDA approved for NASAL specimens only), is one component of a comprehensive MRSA colonization surveillance program. It is not intended to diagnose MRSA infection nor to guide or monitor treatment for MRSA infections.   Cath Tip Culture     Status: None   Collection Time: 12/16/15  3:30 PM  Result Value Ref Range Status   Specimen Description HEMODIALYSIS CATHETER  Final   Special Requests NONE  Final   Culture   Final    NO GROWTH 3 DAYS Performed at [facility]    Report Status 12/20/2015 FINAL  Final  MRSA PCR Screening     Status: None   Collection Time: 12/17/15 11:32 AM  Result Value Ref Range Status   MRSA by PCR NEGATIVE NEGATIVE Final    Comment:        The GeneXpert MRSA Assay (FDA approved for NASAL specimens only), is one component of a comprehensive MRSA colonization surveillance program. It is not intended to diagnose MRSA infection nor to guide or monitor treatment for MRSA infections.   Culture, blood (single) w Reflex to ID Panel     Status: None (Preliminary result)   Collection Time: 12/17/15  5:30 PM  Result Value Ref Range Status   Specimen  Description PORTA CATH  Final   Special Requests BOTTLES DRAWN AEROBIC AND ANAEROBIC 5ML  Final   Culture NO GROWTH 4 DAYS  Final   Report Status PENDING  Incomplete     Coagulation Studies:  Recent Labs  12/19/15 0438 12/20/15 0528 12/21/15 0607  LABPROT 28.3* 19.3* 16.6*  INR 2.59 1.61 1.33    Urinalysis: No results for input(s): COLORURINE, LABSPEC, PHURINE, GLUCOSEU, HGBUR, BILIRUBINUR, KETONESUR, PROTEINUR, UROBILINOGEN, NITRITE, LEUKOCYTESUR in the last 72 hours.  Invalid input(s): APPERANCEUR    Imaging: Dg Chest 2 View  Result Date: 12/21/2015 CLINICAL DATA:  60 year old female with recent respiratory failure and sepsis. Extubated. Porta cath removal on 12/20/2015. Initial encounter. EXAM: CHEST  2 VIEW COMPARISON:  12/18/2015 and earlier. FINDINGS: Seated AP and lateral views of the chest. Left Port-A-Cath has been removed. Extubated enteric tube removed. Stable lung volumes. Decreased right perihilar opacity but interval increased more peripheral right upper lobe opacity which appears to border the minor fissure (arrows). No definite pleural effusion. Stable cardiac size and mediastinal contours. Tortuous aorta. Calcified aortic atherosclerosis. No pneumothorax identified. Pulmonary vascular congestion without overt edema. Mild posterior lower lobe patchy opacity on the lateral view, not correlated on the frontal. Kyphoscoliosis. No acute osseous abnormality identified. IMPRESSION: 1. Left chest porta cath removed. Extubated and enteric tube removed. 2. Improved right perihilar ventilation but right lateral upper lobe pneumonia with increased consolidations since 12/18/2015. No definite pleural effusion. 3. Probable superimposed posterior lower lobe atelectasis. 4.  Calcified aortic atherosclerosis. Electronically Signed   By: Genevie Ann M.D.   On: 12/21/2015 12:06     Medications:   . heparin 1,100 Units/hr (12/21/15 1515)   . aspirin EC  81 mg Oral Daily  . ceFEPIme  1 g Intravenous Daily  . feeding supplement  1 Container Oral TID BM  . lidocaine  20 mL Intradermal Once  . losartan  100 mg Oral Once per day on Sun Tue Thu Sat  .  mouth rinse  15 mL Mouth Rinse BID  . metoprolol  50 mg Oral 2 times per day on Sun Tue Thu Sat  . pantoprazole  40 mg Oral BID AC  . sertraline  50 mg Oral QHS  . sodium chloride flush  10-40 mL Intracatheter Q12H  . sodium chloride flush  3 mL Intravenous Q12H  . vancomycin  750 mg Intravenous Q M,W,F-HD  . warfarin  7.5 mg Oral Once per day on Mon Tue Wed Thu   And  . [START ON 12/22/2015] warfarin  5 mg Oral Once per day on Sun Fri Sat  . Warfarin - Pharmacist Dosing Inpatient   Does not apply q1800   acetaminophen **OR** acetaminophen, benzonatate, bisacodyl, diphenhydrAMINE, heparin lock flush, heparin lock flush, HYDROcodone-acetaminophen, ipratropium-albuterol, metoprolol, phenol, senna-docusate, sodium chloride flush, sodium chloride flush, sodium chloride flush, zolpidem  Assessment/ Plan:  [name] is a 60 y.o. 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  1. ESRD on HD MWF: with acute pulmonary edema  Emergent HD on Sunday. 1100 cc fluid removedtotal 6500 cc removed so far Now has rt fem temp dialysis cathter HD tomorrow to get her back on schedule Permcath next week once all infections are cleared  2. Bacteremia/sepsis: 12/9 gram positive  bacilli from [place].- Corynebacterium Permcath removed. Blood cultures and cath tip cultures are no growth  chemotherapy infusion port removed this admission  3. Anemia of chronic kidney disease: hemoglobin 7.2 - Avoid Epogen given recent diagnosis of breast cancer. - Appreciate oncology input.  - blood transfusion given this admission  4. Secondary hyperparathyroidism. -  hold cinacalcet and sevelamer for now     LOS:  6 Shiven Junious 12/14/20174:37 PM

## 2015-12-21 NOTE — Anesthesia Postprocedure Evaluation (Signed)
Anesthesia Post Note  Patient: Teresa Cook  Procedure(s) Performed: Procedure(s) (LRB): REMOVAL PORT-A-CATH (N/A)  Patient location during evaluation: PACU Anesthesia Type: General Level of consciousness: awake and alert Pain management: pain level controlled Vital Signs Assessment: post-procedure vital signs reviewed and stable Respiratory status: spontaneous breathing, nonlabored ventilation, respiratory function stable and patient connected to nasal cannula oxygen Cardiovascular status: blood pressure returned to baseline and stable Postop Assessment: no signs of nausea or vomiting Anesthetic complications: no    Last Vitals:  Vitals:   12/21/15 1118 12/21/15 1210  BP: (!) 148/97 (!) 142/89  Pulse: 99 90  Resp: 18 18  Temp:  36.8 C    Last Pain:  Vitals:   12/21/15 1210  TempSrc: Oral  PainSc:                  Molli Barrows

## 2015-12-21 NOTE — Care Management Important Message (Signed)
Important Message  Patient Details  Name: Teresa Cook MRN: [id number] Date of Birth: [birth year]   Medicare Important Message Given:  Yes    Shelbie Ammons, RN 12/21/2015, 8:30 AM

## 2015-12-21 NOTE — Progress Notes (Signed)
Electrolyte Monitoring:    Pharmacy consulted for electrolyte monitoring for 60 yo female ICU patient.   12/14 K: 3.0 . MD replaced with KCL 30mEq..   No additional replacement warranted at this time. Will recheck electrolytes on 12/13.   Pharmacy will continue to monitor and adjust per protocol.   Pernell Dupre, PharmD, BCPS Clinical Pharmacist 12/21/2015 10:42 AM

## 2015-12-21 NOTE — Progress Notes (Signed)
60 year old female with multiple medical issues here for pneumonia and potential port site infection. She is postoperative day #1 from a port removal. The patient states she feels much more tired today and does have some heaviness in her chest. She is breathing comfortably on nasal cannula at this time but states when she is laying flat that she does need more help. She does still have some soreness at the port removal site but states is well-controlled with pain medicine.  Vitals:   12/21/15 1118 12/21/15 1210  BP: (!) 148/97 (!) 142/89  Pulse: 99 90  Resp: 18 18  Temp:  98.2 F (36.8 C)   I/O last 3 completed shifts: In: 823 [P.O.:530; I.V.:293] Out: 2500 [Other:2500] No intake/output data recorded.   PE:  Gen: NAD Res: Right chest minimal crackles good sounds, left chest with crackles and rales throughout Cardio: RRR, no murmur Abd: soft, nt, nd Ext: 2+ pulses, no edema Incision: Left chest site incision c/d/i with sterile glue in place   CBC Latest Ref Rng & Units 12/21/2015 12/20/2015 12/19/2015  WBC 3.6 - 11.0 K/uL 3.8 5.2 6.3  Hemoglobin 12.0 - 16.0 g/dL 7.2(L) 7.4(L) 7.0(L)  Hematocrit 35.0 - 47.0 % 21.6(L) 22.2(L) 20.9(L)  Platelets 150 - 440 K/uL 143(L) 143(L) 113(L)   CMP Latest Ref Rng & Units 12/21/2015 12/20/2015 12/19/2015  Glucose 65 - 99 mg/dL 97 104(H) 89  BUN 6 - 20 mg/dL 17 21(H) 37(H)  Creatinine 0.44 - 1.00 mg/dL 3.41(H) 3.46(H) 5.60(H)  Sodium 135 - 145 mmol/L 139 137 134(L)  Potassium 3.5 - 5.1 mmol/L 3.0(L) 5.4(H) 3.5  Chloride 101 - 111 mmol/L 102 98(L) 98(L)  CO2 22 - 32 mmol/L 29 29 27   Calcium 8.9 - 10.3 mg/dL 9.2 9.1 8.7(L)  Total Protein 6.5 - 8.1 g/dL - - -  Total Bilirubin 0.3 - 1.2 mg/dL - - -  Alkaline Phos 38 - 126 U/L - - -  AST 15 - 41 U/L - - -  ALT 14 - 54 U/L - - -   A/P:  60 year old female with multiple medical issues here for pneumonia and potential port site infection. She is postoperative day #1 from a port removal. She is  complaining of increased tightness in chest will get CXR today.  Healing well from port removal.    We have discussed in length her operation for January which will be a bilateral mastectomy with sentinel lymph node biopsies.  I discussed the available options with the patient, daughter and husband. The risk of recurrence is similar between mastectomy and lumpectomy with radiation but given the size of the cancer in the left breast, I am not able to safely offer a lumpectomy.  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  Given that she has had a large cancer on the left and that it would need MRI to follow the right breast lesion every year, she has also opted to have a right mastectomy.   I also discussed that we would need to do a sentinel lymph node biopsy to check the nodes on the right and on the left will do lymph node biopsy and possible axillary dissection since she did have a biopsy positive lymph node.   I discussed risks of bleeding, infection, damage to surrounding tissues, having positive margins, needing further resection, damage to nerves causing arm numbness or difficulty raising arm,  causing lymphoedema in the arm; as well as anesthesia risks of MI, stroke, prolonged ventilation, pulmonary embolism, thrombosis and even death. Patient was given the opportunity to ask questions and have them answered. She would like to proceed with Bilateral mastectomy with bilateral sentinel lymph node biopsy on Jan 16th.

## 2015-12-21 NOTE — Progress Notes (Signed)
[place] [facility] [place] NAME: Teresa Cook    MR#:  [id number]  DATE OF BIRTH:  Oct 22, 1955  SUBJECTIVE: Feels better,. on 2 L of oxygen, saturating 97%. Patient denies any shortness of breath. Hemodialysis yesterday feeling weak and tired otherwise no complaints   CHIEF COMPLAINT:   Chief Complaint  Patient presents with  . Weakness    REVIEW OF SYSTEMS:   ROS CONSTITUTIONAL: No fever, fatigue or weakness.  EYES: No blurred or double vision.  EARS, NOSE, AND THROAT: No tinnitus or ear pain.  RESPIRATORY:Less shortnessof breath today. CARDIOVASCULAR: No chest pain, orthopnea, edema.  GASTROINTESTINAL: No nausea, vomiting, diarrhea mild abdominal pain, Reporting constipation.  GENITOURINARY: Hemodialysis.  ENDOCRINE: No polyuria, nocturia,  HEMATOLOGY: No anemia, easy bruising or bleeding SKIN: No rash or lesion. MUSCULOSKELETAL: No joint pain or arthritis.   NEUROLOGIC: No tingling, numbness, weakness.  PSYCHIATRY: No anxiety or depression.   DRUG ALLERGIES:   Allergies  Allergen Reactions  . Adhesive [Tape] Itching    Silk tape is ok to use.    VITALS:  Blood pressure (!) 142/89, pulse 90, temperature 98.2 F (36.8 C), temperature source Oral, resp. rate 18, height 5\' 6"  (1.676 m), weight 76.1 kg (167 lb 12.3 oz), SpO2 95 %.  PHYSICAL EXAMINATION:  GENERAL:  60 y.o.-year-old patient lying in the bed with no acute distress.  EYES: Pupils equal, round, reactive to light and accommodation. No scleral icterus. Extraocular muscles intact.  HEENT: Head atraumatic, normocephalic. Oropharynx and nasopharynx clear.  NECK:  Supple, no jugular venous distention. No thyroid enlargement, no tenderness.  LUNGS:Moderate breath sounds, clear to auscultation, no wheezing, no rales.  CARDIOVASCULAR: S1, S2 normal. No murmurs, rubs, or gallops.  ABDOMEN: Soft, nontender, nondistended. Bowel sounds present. No organomegaly or mass.   EXTREMITIES: No pedal edema, cyanosis, or clubbing.  NEUROLOGIC: Cranial nerves II through XII are intact. Muscle strength 5/5 in all extremities. Sensation intact. Gait not checked.  PSYCHIATRIC: The patient is alert and oriented x 3.  SKIN: No obvious rash, lesion, or ulcer.    LABORATORY PANEL:   CBC  Recent Labs Lab 12/21/15 0607  WBC 3.8  HGB 7.2*  HCT 21.6*  PLT 143*   ------------------------------------------------------------------------------------------------------------------  Chemistries   Recent Labs Lab 12/18/15 0502 12/18/15 1801  12/21/15 0607  NA 137  --   < > 139  K 3.5  --   < > 3.0*  CL 101  --   < > 102  CO2 29  --   < > 29  GLUCOSE 108*  --   < > 97  BUN 21*  --   < > 17  CREATININE 3.91*  --   < > 3.41*  CALCIUM 8.8*  --   < > 9.2  MG 1.7 2.1  --   --   AST 23  --   --   --   ALT 14  --   --   --   ALKPHOS 171*  --   --   --   BILITOT 0.7  --   --   --   < > = values in this interval not displayed. ------------------------------------------------------------------------------------------------------------------  Cardiac Enzymes  Recent Labs Lab 12/18/15 0157  TROPONINI 0.15*   ------------------------------------------------------------------------------------------------------------------  RADIOLOGY:  Dg Chest 2 View  Result Date: 12/21/2015 CLINICAL DATA:  60 year old female with recent respiratory failure and sepsis. Extubated. Porta cath removal on 12/20/2015. Initial encounter. EXAM: CHEST  2 VIEW  COMPARISON:  12/18/2015 and earlier. FINDINGS: Seated AP and lateral views of the chest. Left Port-A-Cath has been removed. Extubated enteric tube removed. Stable lung volumes. Decreased right perihilar opacity but interval increased more peripheral right upper lobe opacity which appears to border the minor fissure (arrows). No definite pleural effusion. Stable cardiac size and mediastinal contours. Tortuous aorta. Calcified aortic  atherosclerosis. No pneumothorax identified. Pulmonary vascular congestion without overt edema. Mild posterior lower lobe patchy opacity on the lateral view, not correlated on the frontal. Kyphoscoliosis. No acute osseous abnormality identified. IMPRESSION: 1. Left chest porta cath removed. Extubated and enteric tube removed. 2. Improved right perihilar ventilation but right lateral upper lobe pneumonia with increased consolidations since 12/18/2015. No definite pleural effusion. 3. Probable superimposed posterior lower lobe atelectasis. 4.  Calcified aortic atherosclerosis. Electronically Signed   By: Genevie Ann M.D.   On: 12/21/2015 12:06    EKG:   Orders placed or performed during the hospital encounter of 12/15/15  . ED EKG  . ED EKG  . EKG 12-Lead  . EKG 12-Lead  . EKG 12-Lead  . EKG 12-Lead    ASSESSMENT AND PLAN:   #.Acute Respiratory failure secondary to combination of fluid overload, multifocal pneumonia. Status post extubation . Clinically improving continue oxygen 2 L via nasal cannula Patient is transferred to floor from the ICU  #.  sepsis: Secondary to bacteremia  Blood cultures from 12 /9 from HD center diphtheroids. Continue vancomycin, cefepime. procalcitonin level 89.  Cultures from 12/10 no growth, his permacath removal, per catheter tip cultures from December 9 didn't show any growth.  #.Anemia of chronic kidney disease. Hemoglobin dropped yesterday, received one transfusion. hemoglobin improved 7.2 today   #.history of breast cancer finished chemotherapy. Port is removed for sepsis  # essential hypertension-blood pressure is stable  #history of left leg DVT: Because of anemia stopped  Warfarin.  Transmitted 1 unit of packed RBC, hemoglobin stable at 7.2 Patient is started on heparin drip while bridging Coumadin No active bleeding  # end-stage renal disease continue hemodialysis on Monday Wednesday Friday Patient has temporary right femoral dialysis catheter at  this time Planning for new permacath placement next week   # hypertensive urgency secondary to flash pulmonary edema: Improved with hemodialysis. 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 Physical therapy consult.   All the records are reviewed and case discussed with Care Management/Social Workerr. Management plans discussed with the patient, family and they are in agreement.  CODE STATUS: full  TOTAL TIME TAKING CARE OF THIS PATIENT: 40 min  POSSIBLE D/C IN 4-5 DAYS, DEPENDING ON CLINICAL CONDITION.   [name] M.D on 12/21/2015 at 3:09 PM  Between 7am to 6pm - Pager - [phone]  After 6pm go to [email or web address] - password [email or web address] [facility]  Office  [phone]  CC: Primary care physician; Murlean Iba, MD   Note: This dictation was prepared with Dragon dictation along with smaller phrase technology. Any transcriptional errors that result from this process are unintentional.

## 2015-12-21 NOTE — Progress Notes (Signed)
ANTICOAGULATION CONSULT NOTE - Follow Up Consult  Pharmacy Consult for Heparin Drip and warfarin  Indication: hx of VTE  Allergies  Allergen Reactions  . Adhesive [Tape] Itching    Silk tape is ok to use.    Patient Measurements: Height: 5\' 6"  (167.6 cm) Weight:  (uta-bed scale reading >140kg) IBW/kg (Calculated) : 59.3 Heparin Dosing Weight:   Vital Signs: Temp: 98 F (36.7 C) (12/14 0416) Temp Source: Oral (12/14 0416) BP: 154/76 (12/14 0416) Pulse Rate: 104 (12/14 0416)  Labs:  Recent Labs  12/19/15 0438 12/20/15 0439 12/20/15 0528 12/20/15 2245 12/21/15 0607 12/21/15 0855  HGB 7.0* 7.4*  --   --  7.2*  --   HCT 20.9* 22.2*  --   --  21.6*  --   PLT 113* 143*  --   --  143*  --   LABPROT 28.3*  --  19.3*  --  16.6*  --   INR 2.59  --  1.61  --  1.33  --   HEPARINUNFRC  --   --   --  0.21*  --  0.43  CREATININE 5.60* 3.46*  --   --  3.41*  --     Estimated Creatinine Clearance: 18.1 mL/min (by C-G formula based on SCr of 3.41 mg/dL (H)).   Assessment:  Pharmacy consulted for heparin drip management for 60 yo female transferred to ICU due to flash pulmonary edema and requiring emergent intubation. Pharmacy previously consulted for warfarin management; patient is currently subtherapeutic. Patient has history significant for DVT x 2 and required warfarin as an outpatient. Patient initiated on heparin drip went transitioned to ICU. Heparin drip was held due to therapeutic INR. Port removal today 12/13. INR now subtherapeutic at 1.61.  Pharmacy consulted for heparin drip dosing and monitoring for bridge to therapeutic INR due to PMH of DVT.   Patients home dose:  M,T,W, Th :7.5mg                                      F, Sa, Sun: 5mg     DATE:          INR:            DOSE  12/13            1.61        7.5mg  12/14          1.33      7.5mg   12/15     Goal of Therapy:  Anti-Xa 0.3-0.7  INR: 2-3 Monitor platelets by anticoagulation protocol: Yes   Plan:   Heparin 900units/hr. No bolus. Will check Anti-Xa in 6 hours.   Will start patient on home dose of warfarin 7.5mg  M,T,W,Th and 5mg  F,Sat,Sun.   INRs ordered daily. Heparin drip to be stopped after INR therapeutic.   12/13:  HL @ 22:45 = 0.21 .   Will order heparin 900 units IV X 1 bolus and increase drip rate to 1100 units/hr.  Will recheck HL 8 hrs after rate change.   12/14 HL: 0.43 . Will continue heparin drip at 1100 units/hr. Will recheck heparin level in 6 hours for confirmation. INR subtherapeutic at 1.33. Will continue with patients home dose of 7.5mg  today. Expect INR to start trending up in next 48 hours.   Pharmacy will continue to monitor and adjust per consult.    Pernell Dupre, PharmD, BCPS Clinical Pharmacist 12/21/2015  10:36 AM

## 2015-12-22 ENCOUNTER — Other Ambulatory Visit: Payer: Self-pay

## 2015-12-22 DIAGNOSIS — C50912 Malignant neoplasm of unspecified site of left female breast: Secondary | ICD-10-CM

## 2015-12-22 DIAGNOSIS — Z171 Estrogen receptor negative status [ER-]: Principal | ICD-10-CM

## 2015-12-22 LAB — MAGNESIUM: Magnesium: 2.1 mg/dL (ref 1.7–2.4)

## 2015-12-22 LAB — BASIC METABOLIC PANEL
ANION GAP: 9 (ref 5–15)
BUN: 26 mg/dL — ABNORMAL HIGH (ref 6–20)
CALCIUM: 9.3 mg/dL (ref 8.9–10.3)
CO2: 25 mmol/L (ref 22–32)
Chloride: 106 mmol/L (ref 101–111)
Creatinine, Ser: 5.51 mg/dL — ABNORMAL HIGH (ref 0.44–1.00)
GFR, EST AFRICAN AMERICAN: 9 mL/min — AB (ref >60)
GFR, EST NON AFRICAN AMERICAN: 8 mL/min — AB (ref >60)
GLUCOSE: 93 mg/dL (ref 65–99)
Potassium: 4.2 mmol/L (ref 3.5–5.1)
Sodium: 140 mmol/L (ref 135–145)

## 2015-12-22 LAB — VANCOMYCIN, RANDOM: VANCOMYCIN RM: 23

## 2015-12-22 LAB — CBC
HCT: 21.5 % — ABNORMAL LOW (ref 35.0–47.0)
HEMOGLOBIN: 7.1 g/dL — AB (ref 12.0–16.0)
MCH: 28.2 pg (ref 26.0–34.0)
MCHC: 33.1 g/dL (ref 32.0–36.0)
MCV: 85 fL (ref 80.0–100.0)
PLATELETS: 148 10*3/uL — AB (ref 150–440)
RBC: 2.53 MIL/uL — ABNORMAL LOW (ref 3.80–5.20)
RDW: 17.7 % — ABNORMAL HIGH (ref 11.5–14.5)
WBC: 4.8 10*3/uL (ref 3.6–11.0)

## 2015-12-22 LAB — PROTIME-INR
INR: 1.69
PROTHROMBIN TIME: 20.1 s — AB (ref 11.4–15.2)

## 2015-12-22 LAB — PHOSPHORUS: PHOSPHORUS: 3.1 mg/dL (ref 2.5–4.6)

## 2015-12-22 LAB — HEPARIN LEVEL (UNFRACTIONATED): HEPARIN UNFRACTIONATED: 0.4 [IU]/mL (ref 0.30–0.70)

## 2015-12-22 LAB — CULTURE, BLOOD (SINGLE): CULTURE: NO GROWTH

## 2015-12-22 MED ORDER — WARFARIN SODIUM 5 MG PO TABS
2.5000 mg | ORAL_TABLET | Freq: Once | ORAL | Status: AC
  Administered 2015-12-22: 2.5 mg via ORAL
  Filled 2015-12-22: qty 1

## 2015-12-22 MED ORDER — LORAZEPAM 2 MG/ML IJ SOLN
0.5000 mg | Freq: Once | INTRAMUSCULAR | Status: AC
  Administered 2015-12-22: 0.5 mg via INTRAVENOUS
  Filled 2015-12-22: qty 0.25

## 2015-12-22 MED ORDER — ENSURE ENLIVE PO LIQD
237.0000 mL | Freq: Three times a day (TID) | ORAL | Status: DC
  Administered 2015-12-22 – 2015-12-26 (×5): 237 mL via ORAL

## 2015-12-22 MED ORDER — CLONAZEPAM 1 MG PO TABS
1.0000 mg | ORAL_TABLET | Freq: Every day | ORAL | Status: DC
  Administered 2015-12-22 – 2015-12-27 (×6): 1 mg via ORAL
  Filled 2015-12-22 (×6): qty 1

## 2015-12-22 NOTE — Progress Notes (Signed)
Nutrition Follow-up  DOCUMENTATION CODES:   Not applicable  INTERVENTION:   Ensure Enlive po BID, each supplement provides 350 kcal and 20 grams of protein  Magic cup TID with meals, each supplement provides 290 kcal and 9 grams of protein  NUTRITION DIAGNOSIS:   Increased nutrient needs related to catabolic illness, cancer and cancer related treatments as evidenced by estimated needs.  GOAL:   Patient will meet greater than or equal to 90% of their needs  MONITOR:   PO intake, Supplement acceptance, Labs, Weight trends, I & O's    ASSESSMENT:   60 yo female admitted 12/08 with sepsis with positive blood cultures from dialysis catheter ; s/p rapid response this AM (12/10) with respiratory distress due to flash pulmonary edema, transferred to the unit and intubated. Pt with hx of ESRD on HD, CHF, DVT, HTN breast cancer (finished chemo in 10/2015 with surgery planned for 01/2016)  Made multiple attempts to see pt today. Pt getting HD and then with MD. Damaris Schooner to RN; unsure if pt has eaten today since she has been out of her room most of the day. RN reports pt does not like Boost Breeze. Will change to Ensure and Magic Cups.    Medications reviewed and include: aspirin, protonix, vancomycin, wafarin  Labs reviewed: BUN 26(H), creat 5.51(H) Hgb 7.1(L), Hct 21.5(L)   Diet Order:  Diet Carb Modified Fluid consistency: Thin; Room service appropriate? Yes  Skin:  Reviewed, no issues  Last BM:  12/9  Height:   Ht Readings from Last 1 Encounters:  12/15/15 5\' 6"  (1.676 m)    Weight:   Wt Readings from Last 1 Encounters:  12/22/15 139 lb 5.3 oz (63.2 kg)    Ideal Body Weight:     BMI:  Body mass index is 22.49 kg/m.  Estimated Nutritional Needs:   Kcal:  1680-1870 (27-30 kcal/kg)  Protein:  75-93 (1.2-1.5 grams/kg)  Fluid:  1000 mL plus UOP  EDUCATION NEEDS:   Education needs addressed  Koleen Distance, RD, LDN

## 2015-12-22 NOTE — Progress Notes (Signed)
[place] INFECTIOUS DISEASE PROGRESS NOTE Date of Admission:  12/15/2015     ID: Teresa Cook is a 60 y.o. female with esrd, Beast cancer  Active Problems:   Sepsis ([place])   Healthcare-associated pneumonia   Bloodstream infection due to port-a-cath, initial encounter   Subjective: Doing ok on HD no fevers, chills.  ROS  Eleven systems are reviewed and negative except per hpi  Medications:  Antibiotics Given (last 72 hours)    Date/Time Action Medication Dose Rate   12/19/15 1703 Given   ceFEPIme (MAXIPIME) 1 GM / 33mL IVPB premix 1 g 100 mL/hr   12/20/15 1712 Given   ceFEPIme (MAXIPIME) 1 GM / 50mL IVPB premix 1 g 100 mL/hr   12/21/15 1704 Given   ceFEPIme (MAXIPIME) 1 GM / 85mL IVPB premix 1 g 100 mL/hr   12/22/15 1213 Given  [given during last hour of dialysis]   vancomycin (VANCOCIN) IVPB 750 mg/150 ml premix 750 mg 150 mL/hr     . aspirin EC  81 mg Oral Daily  . clonazePAM  1 mg Oral QHS  . feeding supplement  1 Container Oral TID BM  . lidocaine  20 mL Intradermal Once  . losartan  100 mg Oral Once per day on Sun Tue Thu Sat  . mouth rinse  15 mL Mouth Rinse BID  . metoprolol  50 mg Oral 2 times per day on Sun Tue Thu Sat  . pantoprazole  40 mg Oral BID AC  . sertraline  50 mg Oral QHS  . sodium chloride flush  10-40 mL Intracatheter Q12H  . sodium chloride flush  3 mL Intravenous Q12H  . vancomycin  750 mg Intravenous Q M,W,F-HD  . warfarin  2.5 mg Oral ONCE-1800  . warfarin  7.5 mg Oral Once per day on Mon Tue Wed Thu   And  . warfarin  5 mg Oral Once per day on Sun Fri Sat  . Warfarin - Pharmacist Dosing Inpatient   Does not apply q1800    Objective: Vital signs in last 24 hours: Temp:  [97.8 F (36.6 C)-99.5 F (37.5 C)] 99.5 F (37.5 C) (12/15 1426) Pulse Rate:  [76-106] 76 (12/15 1426) Resp:  [16-28] 16 (12/15 1426) BP: (146-181)/(88-111) 147/98 (12/15 1426) SpO2:  [97 %-100 %] 98 % (12/15 1426) Weight:  [63.2 kg (139 lb 5.3 oz)-66.9 kg  (147 lb 9 oz)] 63.2 kg (139 lb 5.3 oz) (12/15 1245) Constitutional:  oriented to person, place, and time. Frail, thin  HENT: Frost/AT, PERRLA, no scleral icterus Mouth/Throat: hoarse  Oropharynx is clear and dry. No oropharyngeal exudate.  Cardiovascular: Normal rate, regular rhythm and normal heart sounds.  Pulmonary/Chest: bil rhonchi, bronchial bs Neck = supple, no nuchal rigidity Portacath site wnl Abdominal: Soft. Bowel sounds are normal.  exhibits no distension. There is no tenderness.  Lymphadenopathy: no cervical adenopathy. No axillary adenopathy Neurological: alert and oriented to person, place, and time.  Skin: Skin is warm and dry. No rash noted. No erythema.  Psychiatric: a normal mood and affect.  behavior is normal.   Lab Results  Recent Labs  12/21/15 0607 12/22/15 0603  WBC 3.8 4.8  HGB 7.2* 7.1*  HCT 21.6* 21.5*  NA 139 140  K 3.0* 4.2  CL 102 106  CO2 29 25  BUN 17 26*  CREATININE 3.41* 5.51*    Microbiology: Results for orders placed or performed during the hospital encounter of 12/15/15  Blood Culture (routine x 2)  Status: None   Collection Time: 12/15/15  7:52 PM  Result Value Ref Range Status   Specimen Description BLOOD LEFT ASSIST CONTROL  Final   Special Requests   Final    BOTTLES DRAWN AEROBIC AND ANAEROBIC 12CCAERO,14CCANA   Culture NO GROWTH 5 DAYS  Final   Report Status 12/20/2015 FINAL  Final  Blood Culture (routine x 2)     Status: None   Collection Time: 12/15/15  7:53 PM  Result Value Ref Range Status   Specimen Description BLOOD L HAND  Final   Special Requests   Final    BOTTLES DRAWN AEROBIC AND ANAEROBIC AER 11CC, ANA 10CC   Culture NO GROWTH 5 DAYS  Final   Report Status 12/20/2015 FINAL  Final  Urine culture     Status: None   Collection Time: 12/15/15  8:42 PM  Result Value Ref Range Status   Specimen Description URINE, RANDOM  Final   Special Requests NONE  Final   Culture NO GROWTH Performed at [facility]    Final   Report Status 12/17/2015 FINAL  Final  Culture, sputum-assessment     Status: None   Collection Time: 12/15/15 11:31 PM  Result Value Ref Range Status   Specimen Description EXPECTORATED SPUTUM  Final   Special Requests Immunocompromised  Final   Sputum evaluation   Final    Sputum specimen not acceptable for testing.  Please recollect.   [name] AT 8295 12/16/15.PMH   Report Status 12/16/2015 FINAL  Final  MRSA PCR Screening     Status: None   Collection Time: 12/15/15 11:31 PM  Result Value Ref Range Status   MRSA by PCR NEGATIVE NEGATIVE Final    Comment:        The GeneXpert MRSA Assay (FDA approved for NASAL specimens only), is one component of a comprehensive MRSA colonization surveillance program. It is not intended to diagnose MRSA infection nor to guide or monitor treatment for MRSA infections.   Cath Tip Culture     Status: None   Collection Time: 12/16/15  3:30 PM  Result Value Ref Range Status   Specimen Description HEMODIALYSIS CATHETER  Final   Special Requests NONE  Final   Culture   Final    NO GROWTH 3 DAYS Performed at [facility]    Report Status 12/20/2015 FINAL  Final  MRSA PCR Screening     Status: None   Collection Time: 12/17/15 11:32 AM  Result Value Ref Range Status   MRSA by PCR NEGATIVE NEGATIVE Final    Comment:        The GeneXpert MRSA Assay (FDA approved for NASAL specimens only), is one component of a comprehensive MRSA colonization surveillance program. It is not intended to diagnose MRSA infection nor to guide or monitor treatment for MRSA infections.   Culture, blood (single) w Reflex to ID Panel     Status: None   Collection Time: 12/17/15  5:30 PM  Result Value Ref Range Status   Specimen Description PORTA CATH  Final   Special Requests BOTTLES DRAWN AEROBIC AND ANAEROBIC 5ML  Final   Culture NO GROWTH 5 DAYS  Final   Report Status 12/22/2015 FINAL  Final    Studies/Results: Dg Chest 2  View  Result Date: 12/21/2015 CLINICAL DATA:  61 year old female with recent respiratory failure and sepsis. Extubated. Porta cath removal on 12/20/2015. Initial encounter. EXAM: CHEST  2 VIEW COMPARISON:  12/18/2015 and earlier. FINDINGS: Seated AP and lateral  views of the chest. Left Port-A-Cath has been removed. Extubated enteric tube removed. Stable lung volumes. Decreased right perihilar opacity but interval increased more peripheral right upper lobe opacity which appears to border the minor fissure (arrows). No definite pleural effusion. Stable cardiac size and mediastinal contours. Tortuous aorta. Calcified aortic atherosclerosis. No pneumothorax identified. Pulmonary vascular congestion without overt edema. Mild posterior lower lobe patchy opacity on the lateral view, not correlated on the frontal. Kyphoscoliosis. No acute osseous abnormality identified. IMPRESSION: 1. Left chest porta cath removed. Extubated and enteric tube removed. 2. Improved right perihilar ventilation but right lateral upper lobe pneumonia with increased consolidations since 12/18/2015. No definite pleural effusion. 3. Probable superimposed posterior lower lobe atelectasis. 4.  Calcified aortic atherosclerosis. Electronically Signed   By: Genevie Ann M.D.   On: 12/21/2015 12:06    Assessment/Plan: Teresa Cook is a 60 y.o. female admitted with cough, ever and weakness as well as otpt HD culture with [place] species.  She has ESRD and is on HD through R chest wall HD cath and has portacath in place. She was on abx at time of admission with augmentin. [place] here negative. No fevers since admit but had acute respiratory decline felt likely due to acute pulmonary edema.  Her HD cath is removed, portacath removed today 12/12..  FU bcx here negative Outpt HD cath  Recommendations Would plan on 10 days after removal of port  Stopped cefepime 12/14 .   - stop vanco on 12/23   Thank you very much for the consult. Will follow with  you.  [place], Khani Paino P   12/22/2015, 3:05 PM

## 2015-12-22 NOTE — Progress Notes (Signed)
Post hd assessment 

## 2015-12-22 NOTE — Progress Notes (Signed)
ANTICOAGULATION CONSULT NOTE - Follow Up Consult  Pharmacy Consult for Heparin Drip and warfarin  Indication: hx of VTE  Allergies  Allergen Reactions  . Adhesive [Tape] Itching    Silk tape is ok to use.    Patient Measurements: Height: 5\' 6"  (167.6 cm) Weight: 147 lb 9 oz (66.9 kg) IBW/kg (Calculated) : 59.3 Heparin Dosing Weight:   Vital Signs: Temp: 98.7 F (37.1 C) (12/15 0446) Temp Source: Oral (12/15 0446) BP: 169/88 (12/15 0446) Pulse Rate: 83 (12/15 0446)  Labs:  Recent Labs  12/20/15 0439 12/20/15 0528  12/21/15 0607 12/21/15 0855 12/21/15 1516 12/22/15 0603  HGB 7.4*  --   --  7.2*  --   --  7.1*  HCT 22.2*  --   --  21.6*  --   --  21.5*  PLT 143*  --   --  143*  --   --  148*  LABPROT  --  19.3*  --  16.6*  --   --  20.1*  INR  --  1.61  --  1.33  --   --  1.69  HEPARINUNFRC  --   --   < >  --  0.43 0.32 0.40  CREATININE 3.46*  --   --  3.41*  --   --   --   < > = values in this interval not displayed.  Estimated Creatinine Clearance: 16.2 mL/min (by C-G formula based on SCr of 3.41 mg/dL (H)).   Assessment:  Pharmacy consulted for heparin drip management for 60 yo female transferred to ICU due to flash pulmonary edema and requiring emergent intubation. Pharmacy previously consulted for warfarin management; patient is currently subtherapeutic. Patient has history significant for DVT x 2 and required warfarin as an outpatient. Patient initiated on heparin drip went transitioned to ICU. Heparin drip was held due to therapeutic INR. Port removal today 12/13. INR now subtherapeutic at 1.61.  Pharmacy consulted for heparin drip dosing and monitoring for bridge to therapeutic INR due to PMH of DVT.   Patients home dose:  M,T,W, Th :7.5mg                                      F, Sa, Sun: 5mg     DATE:          INR:            DOSE  12/13            1.61        7.5mg  12/14          1.33      7.5mg   12/15     Goal of Therapy:  Anti-Xa 0.3-0.7  INR:  2-3 Monitor platelets by anticoagulation protocol: Yes   Plan:  Heparin 900units/hr. No bolus. Will check Anti-Xa in 6 hours.   Will start patient on home dose of warfarin 7.5mg  M,T,W,Th and 5mg  F,Sat,Sun.   INRs ordered daily. Heparin drip to be stopped after INR therapeutic.   12/13:  HL @ 22:45 = 0.21 .   Will order heparin 900 units IV X 1 bolus and increase drip rate to 1100 units/hr.  Will recheck HL 8 hrs after rate change.   12/14 HL: 0.43 . Will continue heparin drip at 1100 units/hr. Will recheck heparin level in 6 hours for confirmation. INR subtherapeutic at 1.33. Will continue with patients home dose of 7.5mg  today. Expect  INR to start trending up in next 48 hours.  12/14 HL: 0.32. This is the second level with in therapeutic range. Will continue heparin drip at 1100 units/hr. HL and CBC ordered with AM labs.   12/15 HL @ 0600 = 0.4,  INR = 1.69.    Will continue this pt on current drip rate and recheck INR and HL on 12/16 with AM labs.   Pharmacy will continue to monitor and adjust per consult.    Orene Desanctis, PharmD Clinical Pharmacist 12/22/2015 7:14 AM

## 2015-12-22 NOTE — Progress Notes (Signed)
Post hd vitals 

## 2015-12-22 NOTE — Progress Notes (Signed)
PRE DIALYSIS ASSESSMENT 

## 2015-12-22 NOTE — Progress Notes (Signed)
Pt transported to dialysis

## 2015-12-22 NOTE — Progress Notes (Addendum)
ANTICOAGULATION CONSULT NOTE - Follow Up Consult  Pharmacy Consult for Heparin Drip and warfarin  Indication: hx of VTE  Allergies  Allergen Reactions  . Adhesive [Tape] Itching    Silk tape is ok to use.    Patient Measurements: Height: 5\' 6"  (167.6 cm) Weight: 147 lb 9 oz (66.9 kg) IBW/kg (Calculated) : 59.3 Heparin Dosing Weight:   Vital Signs: Temp: 98.7 F (37.1 C) (12/15 0446) Temp Source: Oral (12/15 0446) BP: 169/88 (12/15 0446) Pulse Rate: 83 (12/15 0446)  Labs:  Recent Labs  12/20/15 0439 12/20/15 0528  12/21/15 0607 12/21/15 0855 12/21/15 1516 12/22/15 0603  HGB 7.4*  --   --  7.2*  --   --  7.1*  HCT 22.2*  --   --  21.6*  --   --  21.5*  PLT 143*  --   --  143*  --   --  148*  LABPROT  --  19.3*  --  16.6*  --   --  20.1*  INR  --  1.61  --  1.33  --   --  1.69  HEPARINUNFRC  --   --   < >  --  0.43 0.32 0.40  CREATININE 3.46*  --   --  3.41*  --   --  5.51*  < > = values in this interval not displayed.  Estimated Creatinine Clearance: 10 mL/min (by C-G formula based on SCr of 5.51 mg/dL (H)).   Assessment:  Pharmacy consulted for heparin drip management for 60 yo female transferred to ICU due to flash pulmonary edema and requiring emergent intubation. Pharmacy previously consulted for warfarin management; patient is currently subtherapeutic. Patient has history significant for DVT x 2 and required warfarin as an outpatient. Patient initiated on heparin drip went transitioned to ICU. Heparin drip was held due to therapeutic INR. Port removal today 12/13. INR now subtherapeutic at 1.61.  Pharmacy consulted for heparin drip dosing and monitoring for bridge to therapeutic INR due to PMH of DVT.   Patients home dose:  M,T,W, Th :7.5mg                                      F, Sa, Sun: 5mg     DATE:          INR:            DOSE  12/13            1.61        7.5mg  12/14          1.33      7.5mg   12/15           1.69             7.5mg        12/16            Goal of Therapy:  Anti-Xa 0.3-0.7  INR: 2-3 Monitor platelets by anticoagulation protocol: Yes   Plan:  Heparin 900units/hr. No bolus. Will check Anti-Xa in 6 hours.   Will start patient on home dose of warfarin 7.5mg  M,T,W,Th and 5mg  F,Sat,Sun.   INRs ordered daily. Heparin drip to be stopped after INR therapeutic.   12/13:  HL @ 22:45 = 0.21 .   Will order heparin 900 units IV X 1 bolus and increase drip rate to 1100 units/hr.  Will recheck HL 8 hrs after rate change.  12/14 HL: 0.43 . Will continue heparin drip at 1100 units/hr. Will recheck heparin level in 6 hours for confirmation. INR subtherapeutic at 1.33. Will continue with patients home dose of 7.5mg  today. Expect INR to start trending up in next 48 hours.  12/14 HL: 0.32. This is the second level with in therapeutic range. Will continue heparin drip at 1100 units/hr. HL and CBC ordered with AM labs.   12/15 HL @ 0600 = 0.4,  INR = 1.69.    Will continue this pt on current drip rate and recheck INR and HL on 12/16 with AM labs. INR being to increase, still subtherapeutic. Will give the patient an extra 2.5mg  tonight on top of her regular scheduled 5mg  tablet.   Pharmacy will continue to monitor and adjust per consult.    Pernell Dupre, PharmD Clinical Pharmacist 12/22/2015 7:30 AM

## 2015-12-22 NOTE — Progress Notes (Signed)
[facility]  ROUNDING NOTE   Subjective:   Patient seen during dialysis Tolerating well  Feeing anxious Irregular heart rhythm BP is high   HEMODIALYSIS FLOWSHEET:  Blood Flow Rate (mL/min): 350 mL/min Arterial Pressure (mmHg): -180 mmHg Venous Pressure (mmHg): 160 mmHg Transmembrane Pressure (mmHg): 70 mmHg Ultrafiltration Rate (mL/min): 1170 mL/min Dialysate Flow Rate (mL/min): 600 ml/min Conductivity: Machine : 14 Conductivity: Machine : 14 Dialysis Fluid Bolus: Normal Saline Bolus Amount (mL): 250 mL Dialysate Change: 2K Intra-Hemodialysis Comments: 1795ML. Pt anxious. Pharmacy bringing anxiety meds (RESTING WITH EYES OPEN)      Objective:  Vital signs in last 24 hours:  Temp:  [98.2 F (36.8 C)-98.7 F (37.1 C)] 98.4 F (36.9 C) (12/15 1000) Pulse Rate:  [77-97] 97 (12/15 1130) Resp:  [18-25] 25 (12/15 1130) BP: (142-181)/(88-111) 151/111 (12/15 1130) SpO2:  [95 %-100 %] 97 % (12/15 1130) Weight:  [65.4 kg (144 lb 2.9 oz)-66.9 kg (147 lb 9 oz)] 65.4 kg (144 lb 2.9 oz) (12/15 0955)  Weight change:  Filed Weights   12/22/15 0537 12/22/15 0955  Weight: 66.9 kg (147 lb 9 oz) 65.4 kg (144 lb 2.9 oz)    Intake/Output: I/O last 3 completed shifts: In: 393 [I.V.:393] Out: -    Intake/Output this shift:  No intake/output data recorded.  Physical Exam: General: chronically ill  ENT Moist oral mucus membranes  Eyes: anicteruc  Neck:  supple  Lungs:  Bilateral rhonchi, crackles  Heart: Irregular, A Fib  Abdomen:  Soft, nontender  Extremities: no peripheral edema.  Neurologic: Alert,    Skin: No lesions  Access:  rt fem temp HD catheter 12/10 Dr. Stevenson Clinch    Basic Metabolic Panel:  Recent Labs Lab 12/17/15 1300 12/17/15 1521 12/18/15 0502 12/18/15 1801 12/19/15 0438 12/20/15 0439 12/21/15 0607 12/22/15 0603  NA 137  --  137  --  134* 137 139 140  K 4.2  --  3.5  --  3.5 5.4* 3.0* 4.2  CL 105  --  101  --  98* 98* 102 106  CO2 23   --  29  --  '27 29 29 25  ' GLUCOSE 153*  --  108*  --  89 104* 97 93  BUN 26*  --  21*  --  37* 21* 17 26*  CREATININE 7.10*  --  3.91*  --  5.60* 3.46* 3.41* 5.51*  CALCIUM 8.0*  --  8.8*  --  8.7* 9.1 9.2 9.3  MG 1.9 1.8 1.7 2.1  --   --   --  2.1  PHOS 5.1* 2.9 3.4 3.5  --   --   --  3.1    Liver Function Tests:  Recent Labs Lab 12/15/15 1952 12/18/15 0502  AST 27 23  ALT 20 14  ALKPHOS 295* 171*  BILITOT 0.4 0.7  PROT 6.4* 5.3*  ALBUMIN 3.1* 2.6*   No results for input(s): LIPASE, AMYLASE in the last 168 hours. No results for input(s): AMMONIA in the last 168 hours.  CBC:  Recent Labs Lab 12/15/15 1952  12/17/15 1300 12/18/15 0502 12/18/15 2008 12/19/15 0438 12/20/15 0439 12/21/15 0607 12/22/15 0603  WBC 4.2  < > 10.3 8.7  --  6.3 5.2 3.8 4.8  NEUTROABS 3.0  --  8.9*  --   --   --  3.6  --   --   HGB 7.1*  < > 7.7* 6.1* 7.1* 7.0* 7.4* 7.2* 7.1*  HCT 21.4*  < > 23.1* 18.3* 21.3*  20.9* 22.2* 21.6* 21.5*  MCV 86.1  < > 85.3 85.5  --  83.5 83.0 83.7 85.0  PLT 142*  < > 178 119*  --  113* 143* 143* 148*  < > = values in this interval not displayed.  Cardiac Enzymes:  Recent Labs Lab 12/17/15 1300 12/17/15 1820 12/18/15 0157  TROPONINI 0.06* 0.11* 0.15*    BNP: Invalid input(s): POCBNP  CBG:  Recent Labs Lab 12/19/15 2357 12/20/15 0408 12/20/15 1958 12/21/15 0118 12/21/15 0439  GLUCAP 118* 91 114* 92 105*    Microbiology: Results for orders placed or performed during the hospital encounter of 12/15/15  Blood Culture (routine x 2)     Status: None   Collection Time: 12/15/15  7:52 PM  Result Value Ref Range Status   Specimen Description BLOOD LEFT ASSIST CONTROL  Final   Special Requests   Final    BOTTLES DRAWN AEROBIC AND ANAEROBIC 12CCAERO,14CCANA   Culture NO GROWTH 5 DAYS  Final   Report Status 12/20/2015 FINAL  Final  Blood Culture (routine x 2)     Status: None   Collection Time: 12/15/15  7:53 PM  Result Value Ref Range Status    Specimen Description BLOOD L HAND  Final   Special Requests   Final    BOTTLES DRAWN AEROBIC AND ANAEROBIC AER 11CC, ANA 10CC   Culture NO GROWTH 5 DAYS  Final   Report Status 12/20/2015 FINAL  Final  Urine culture     Status: None   Collection Time: 12/15/15  8:42 PM  Result Value Ref Range Status   Specimen Description URINE, RANDOM  Final   Special Requests NONE  Final   Culture NO GROWTH Performed at [facility]   Final   Report Status 12/17/2015 FINAL  Final  Culture, sputum-assessment     Status: None   Collection Time: 12/15/15 11:31 PM  Result Value Ref Range Status   Specimen Description EXPECTORATED SPUTUM  Final   Special Requests Immunocompromised  Final   Sputum evaluation   Final    Sputum specimen not acceptable for testing.  Please recollect.   [name] AT 1610 12/16/15.PMH   Report Status 12/16/2015 FINAL  Final  MRSA PCR Screening     Status: None   Collection Time: 12/15/15 11:31 PM  Result Value Ref Range Status   MRSA by PCR NEGATIVE NEGATIVE Final    Comment:        The GeneXpert MRSA Assay (FDA approved for NASAL specimens only), is one component of a comprehensive MRSA colonization surveillance program. It is not intended to diagnose MRSA infection nor to guide or monitor treatment for MRSA infections.   Cath Tip Culture     Status: None   Collection Time: 12/16/15  3:30 PM  Result Value Ref Range Status   Specimen Description HEMODIALYSIS CATHETER  Final   Special Requests NONE  Final   Culture   Final    NO GROWTH 3 DAYS Performed at [facility]    Report Status 12/20/2015 FINAL  Final  MRSA PCR Screening     Status: None   Collection Time: 12/17/15 11:32 AM  Result Value Ref Range Status   MRSA by PCR NEGATIVE NEGATIVE Final    Comment:        The GeneXpert MRSA Assay (FDA approved for NASAL specimens only), is one component of a comprehensive MRSA colonization surveillance program. It is not intended to  diagnose MRSA infection nor to guide or  monitor treatment for MRSA infections.   Culture, blood (single) w Reflex to ID Panel     Status: None   Collection Time: 12/17/15  5:30 PM  Result Value Ref Range Status   Specimen Description PORTA CATH  Final   Special Requests BOTTLES DRAWN AEROBIC AND ANAEROBIC 5ML  Final   Culture NO GROWTH 5 DAYS  Final   Report Status 12/22/2015 FINAL  Final    Coagulation Studies:  Recent Labs  12/20/15 0528 12/21/15 0607 12/22/15 0603  LABPROT 19.3* 16.6* 20.1*  INR 1.61 1.33 1.69    Urinalysis: No results for input(s): COLORURINE, LABSPEC, PHURINE, GLUCOSEU, HGBUR, BILIRUBINUR, KETONESUR, PROTEINUR, UROBILINOGEN, NITRITE, LEUKOCYTESUR in the last 72 hours.  Invalid input(s): APPERANCEUR    Imaging: Dg Chest 2 View  Result Date: 12/21/2015 CLINICAL DATA:  60 year old female with recent respiratory failure and sepsis. Extubated. Porta cath removal on 12/20/2015. Initial encounter. EXAM: CHEST  2 VIEW COMPARISON:  12/18/2015 and earlier. FINDINGS: Seated AP and lateral views of the chest. Left Port-A-Cath has been removed. Extubated enteric tube removed. Stable lung volumes. Decreased right perihilar opacity but interval increased more peripheral right upper lobe opacity which appears to border the minor fissure (arrows). No definite pleural effusion. Stable cardiac size and mediastinal contours. Tortuous aorta. Calcified aortic atherosclerosis. No pneumothorax identified. Pulmonary vascular congestion without overt edema. Mild posterior lower lobe patchy opacity on the lateral view, not correlated on the frontal. Kyphoscoliosis. No acute osseous abnormality identified. IMPRESSION: 1. Left chest porta cath removed. Extubated and enteric tube removed. 2. Improved right perihilar ventilation but right lateral upper lobe pneumonia with increased consolidations since 12/18/2015. No definite pleural effusion. 3. Probable superimposed posterior lower lobe  atelectasis. 4.  Calcified aortic atherosclerosis. Electronically Signed   By: Genevie Ann M.D.   On: 12/21/2015 12:06     Medications:   . heparin 1,100 Units/hr (12/21/15 1515)   . aspirin EC  81 mg Oral Daily  . clonazePAM  1 mg Oral QHS  . feeding supplement  1 Container Oral TID BM  . lidocaine  20 mL Intradermal Once  . LORazepam  0.5 mg Intravenous Once  . losartan  100 mg Oral Once per day on Sun Tue Thu Sat  . mouth rinse  15 mL Mouth Rinse BID  . metoprolol  50 mg Oral 2 times per day on Sun Tue Thu Sat  . pantoprazole  40 mg Oral BID AC  . sertraline  50 mg Oral QHS  . sodium chloride flush  10-40 mL Intracatheter Q12H  . sodium chloride flush  3 mL Intravenous Q12H  . vancomycin  750 mg Intravenous Q M,W,F-HD  . warfarin  2.5 mg Oral ONCE-1800  . warfarin  7.5 mg Oral Once per day on Mon Tue Wed Thu   And  . warfarin  5 mg Oral Once per day on Sun Fri Sat  . Warfarin - Pharmacist Dosing Inpatient   Does not apply q1800   acetaminophen **OR** acetaminophen, benzonatate, bisacodyl, diphenhydrAMINE, heparin lock flush, heparin lock flush, HYDROcodone-acetaminophen, ipratropium-albuterol, metoprolol, phenol, senna-docusate, sodium chloride flush, sodium chloride flush, sodium chloride flush, zolpidem  Assessment/ Plan:  [name] is a 60 y.o. 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 [employer].  1.  ESRD on HD MWF: with acute pulmonary edema  Emergent HD on Sunday. 1100 cc fluid removed total 6500 cc removed so far Now has rt fem temp dialysis cathter Patient seen during dialysis Tolerating well  Permcath next week once all infections are cleared  2. Bacteremia/sepsis: 12/9 gram positive bacilli  from [place].- Corynebacterium Permcath removed. Blood cultures and cath tip cultures are no growth  chemotherapy infusion port removed this admission  3. Anemia of chronic kidney disease: hemoglobin 7.1 - Avoid Epogen given recent diagnosis of breast cancer. - blood transfusion given this admission  4. Secondary hyperparathyroidism. -  hold cinacalcet and sevelamer for now - Phos 3.1     LOS: 7 Shakiah Wester 12/15/201711:49 AM

## 2015-12-22 NOTE — Progress Notes (Signed)
Pt. back from dialysis

## 2015-12-22 NOTE — Progress Notes (Signed)
  End of hd 

## 2015-12-22 NOTE — Progress Notes (Signed)
Pt is refusing to wear Bipap. Pt encouraged to wear but continues to refuse.

## 2015-12-22 NOTE — Progress Notes (Signed)
60 year old female with multiple medical issues here for pneumonia and potential port site infection. She is postoperative day #2 from a port removal. The patient very tired today.  She went to diaylsis and had a panic attack and they gave her some ativan.  They had to stop 30mins early due to clotting in the venous line.  She otherwise has been coughing up some sputum.    Vitals:   12/22/15 1255 12/22/15 1426  BP: (!) 146/100 (!) 147/98  Pulse: 99 76  Resp: (!) 21 16  Temp:  99.5 F (37.5 C)   I/O last 3 completed shifts: In: 393 [I.V.:393] Out: -  Total I/O In: 116.6 [I.V.:116.6] Out: 2669 [Other:2669]   PE:  Gen: NAD Res: crackles in right chest, left clear today Cardio: RRR, no murmur Abd: soft, nt, nd Ext: 2+ pulses, no edema Incision: Left chest site incision c/d/i with sterile glue in place   CBC Latest Ref Rng & Units 12/22/2015 12/21/2015 12/20/2015  WBC 3.6 - 11.0 K/uL 4.8 3.8 5.2  Hemoglobin 12.0 - 16.0 g/dL 7.1(L) 7.2(L) 7.4(L)  Hematocrit 35.0 - 47.0 % 21.5(L) 21.6(L) 22.2(L)  Platelets 150 - 440 K/uL 148(L) 143(L) 143(L)   CMP Latest Ref Rng & Units 12/22/2015 12/21/2015 12/20/2015  Glucose 65 - 99 mg/dL 93 97 104(H)  BUN 6 - 20 mg/dL 26(H) 17 21(H)  Creatinine 0.44 - 1.00 mg/dL 5.51(H) 3.41(H) 3.46(H)  Sodium 135 - 145 mmol/L 140 139 137  Potassium 3.5 - 5.1 mmol/L 4.2 3.0(L) 5.4(H)  Chloride 101 - 111 mmol/L 106 102 98(L)  CO2 22 - 32 mmol/L 25 29 29   Calcium 8.9 - 10.3 mg/dL 9.3 9.2 9.1  Total Protein 6.5 - 8.1 g/dL - - -  Total Bilirubin 0.3 - 1.2 mg/dL - - -  Alkaline Phos 38 - 126 U/L - - -  AST 15 - 41 U/L - - -  ALT 14 - 54 U/L - - -   A/P:  60 year old female with multiple medical issues here for pneumonia and potential port site infection. She is postoperative day #2 from a port removal. She is very tired today but healing well at port site.  Seems to be improving some from the pneumonia as well.

## 2015-12-22 NOTE — Progress Notes (Signed)
Pharmacy Antibiotic Note  Teresa Cook is a 60 y.o. female admitted on 12/15/2015 with sepsis, being treated for line infection. Pharmacy has been consulted for vancomycin and cefepime dosing.  Plan: 1. Cefepime 1g IV Q24hr- Course finished on 12/14 after 6 days of therapy.   2. 12/15 Vanc level Pre-HD: 23 (Goal: 15-25 mcg/mL) Will continue with  Vancomycin 750mg  to be given at the conclusion of each dialysis session. Will obtain next trough on 12/20.   Height: 5\' 6"  (167.6 cm) Weight: 147 lb 9 oz (66.9 kg) IBW/kg (Calculated) : 59.3  Temp (24hrs), Avg:98.5 F (36.9 C), Min:98.2 F (36.8 C), Max:98.7 F (37.1 C)   Recent Labs Lab 12/15/15 1952  12/17/15 1300 12/17/15 1820 12/18/15 0502 12/19/15 0438 12/20/15 0439 12/21/15 0607 12/22/15 0603  WBC 4.2  < > 10.3  --  8.7 6.3 5.2 3.8 4.8  CREATININE 3.36*  < > 7.10*  --  3.91* 5.60* 3.46* 3.41* 5.51*  LATICACIDVEN 1.1  --  1.7 1.7  --   --   --   --   --   VANCORANDOM  --   --   --   --   --   --   --   --  23  < > = values in this interval not displayed.  Estimated Creatinine Clearance: 10 mL/min (by C-G formula based on SCr of 5.51 mg/dL (H)).    Allergies  Allergen Reactions  . Adhesive [Tape] Itching    Silk tape is ok to use.    Antimicrobials this admission: Cefepime 12/8 >> 12/14 Vancomycin 12/8 >>   Dose adjustments this admission: N/A  Microbiology results: 12/10 BCx: no growth < 24 hours 12/9 Cath Tip Cx: no growth x 2 days  12/8 BCx: no growth x 3  12/8 Urine: no growth  12/8 Sputum: specifmen not acceptable for testing  12/8 MRSA PCR: negative  12/7 BCx Outpt: corynebacterium   Pharmacy will continue to monitor and adjust per consult.   Pernell Dupre, PharmD, BCPS Clinical Pharmacist 12/22/2015 8:12 AM

## 2015-12-22 NOTE — Progress Notes (Signed)
This note also relates to the following rows which could not be included: BP - Cannot attach notes to unvalidated device data  HD STARTED

## 2015-12-22 NOTE — Progress Notes (Signed)
[place] [facility] [place] NAME: Azriel Dancy    MR#:  [id number]  DATE OF BIRTH:  02/24/1955  SUBJECTIVE: Feels better,. on 2 L of oxygen, saturating 97%. Patient denies any shortness of breath. Hemodialysis yesterday feeling weak and tired otherwise no complaints   CHIEF COMPLAINT:   Chief Complaint  Patient presents with  . Weakness  Patient was seen and examined today during hemodialysis. Resting comfortably. No complaints other than weakness  REVIEW OF SYSTEMS:   ROS CONSTITUTIONAL: No fever, fatigue or weakness.  EYES: No blurred or double vision.  EARS, NOSE, AND THROAT: No tinnitus or ear pain.  RESPIRATORY:Less shortnessof breath today. CARDIOVASCULAR: No chest pain, orthopnea, edema.  GASTROINTESTINAL: No nausea, vomiting, diarrhea mild abdominal pain, Reporting constipation.  GENITOURINARY: Hemodialysis.  ENDOCRINE: No polyuria, nocturia,  HEMATOLOGY: No anemia, easy bruising or bleeding SKIN: No rash or lesion. MUSCULOSKELETAL: No joint pain or arthritis.   NEUROLOGIC: No tingling, numbness, weakness.  PSYCHIATRY: No anxiety or depression.   DRUG ALLERGIES:   Allergies  Allergen Reactions  . Adhesive [Tape] Itching    Silk tape is ok to use.    VITALS:  Blood pressure (!) 147/98, pulse 76, temperature 99.5 F (37.5 C), temperature source Oral, resp. rate 16, height 5\' 6"  (1.676 m), weight 63.2 kg (139 lb 5.3 oz), SpO2 98 %.  PHYSICAL EXAMINATION:  GENERAL:  60 y.o.-year-old patient lying in the bed with no acute distress.  EYES: Pupils equal, round, reactive to light and accommodation. No scleral icterus. Extraocular muscles intact.  HEENT: Head atraumatic, normocephalic. Oropharynx and nasopharynx clear.  NECK:  Supple, no jugular venous distention. No thyroid enlargement, no tenderness.  LUNGS:Moderate breath sounds, clear to auscultation, no wheezing, no rales.  CARDIOVASCULAR: S1, S2 normal. No murmurs,  rubs, or gallops.  ABDOMEN: Soft, nontender, nondistended. Bowel sounds present. No organomegaly or mass.  EXTREMITIES: No pedal edema, cyanosis, or clubbing.  NEUROLOGIC: Cranial nerves II through XII are intact. Muscle strength 5/5 in all extremities. Sensation intact. Gait not checked.  PSYCHIATRIC: The patient is alert and oriented x 3.  SKIN: No obvious rash, lesion, or ulcer.    LABORATORY PANEL:   CBC  Recent Labs Lab 12/22/15 0603  WBC 4.8  HGB 7.1*  HCT 21.5*  PLT 148*   ------------------------------------------------------------------------------------------------------------------  Chemistries   Recent Labs Lab 12/18/15 0502  12/22/15 0603  NA 137  < > 140  K 3.5  < > 4.2  CL 101  < > 106  CO2 29  < > 25  GLUCOSE 108*  < > 93  BUN 21*  < > 26*  CREATININE 3.91*  < > 5.51*  CALCIUM 8.8*  < > 9.3  MG 1.7  < > 2.1  AST 23  --   --   ALT 14  --   --   ALKPHOS 171*  --   --   BILITOT 0.7  --   --   < > = values in this interval not displayed. ------------------------------------------------------------------------------------------------------------------  Cardiac Enzymes  Recent Labs Lab 12/18/15 0157  TROPONINI 0.15*   ------------------------------------------------------------------------------------------------------------------  RADIOLOGY:  Dg Chest 2 View  Result Date: 12/21/2015 CLINICAL DATA:  60 year old female with recent respiratory failure and sepsis. Extubated. Porta cath removal on 12/20/2015. Initial encounter. EXAM: CHEST  2 VIEW COMPARISON:  12/18/2015 and earlier. FINDINGS: Seated AP and lateral views of the chest. Left Port-A-Cath has been removed. Extubated enteric tube removed. Stable lung volumes. Decreased  right perihilar opacity but interval increased more peripheral right upper lobe opacity which appears to border the minor fissure (arrows). No definite pleural effusion. Stable cardiac size and mediastinal contours. Tortuous  aorta. Calcified aortic atherosclerosis. No pneumothorax identified. Pulmonary vascular congestion without overt edema. Mild posterior lower lobe patchy opacity on the lateral view, not correlated on the frontal. Kyphoscoliosis. No acute osseous abnormality identified. IMPRESSION: 1. Left chest porta cath removed. Extubated and enteric tube removed. 2. Improved right perihilar ventilation but right lateral upper lobe pneumonia with increased consolidations since 12/18/2015. No definite pleural effusion. 3. Probable superimposed posterior lower lobe atelectasis. 4.  Calcified aortic atherosclerosis. Electronically Signed   By: Genevie Ann M.D.   On: 12/21/2015 12:06    EKG:   Orders placed or performed during the hospital encounter of 12/15/15  . ED EKG  . ED EKG  . EKG 12-Lead  . EKG 12-Lead  . EKG 12-Lead  . EKG 12-Lead    ASSESSMENT AND PLAN:   #.Acute Respiratory failure secondary to combination of fluid overload, multifocal pneumonia. Status post extubation . Clinically improving continue oxygen 2 L via nasal cannula Patient is [facility] 12/20/2015  #.  sepsis: Secondary to bacteremia  Blood cultures from 12 /9 from HD center diphtheroids. Continue vancomycin until 12/30/2015. Discontinued cefepime on 12/21/2015. procalcitonin level 89.  Cultures from 12/10 no growth, his permacath removal, per catheter tip cultures from December 9 didn't show any growth.   #.Anemia of chronic kidney disease. Hemoglobin dropped yesterday 12/20/2015, received one unit transfusion. hemoglobin 7.2 -7.1   #.history of breast cancer finished chemotherapy. Port is removed for sepsis  # essential hypertension-blood pressure is stable  #history of left leg DVT: Because of anemia stopped  Warfarin.  Transmitted 1 unit of packed RBC, hemoglobin stable at 7.2 Patient is started on heparin drip while bridging Coumadin INR at 1.69 today No active bleeding  # end-stage renal disease  continue hemodialysis on Monday Wednesday Friday Patient has temporary right femoral dialysis catheter at this time Planning for new permacath placement next week   # hypertensive urgency secondary to flash pulmonary edema: Improved with hemodialysis. 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 Physical therapy consult.   All the records are reviewed and case discussed with Care Management/Social Workerr. Management plans discussed with the patient, family and they are in agreement.  CODE STATUS: full  TOTAL TIME TAKING CARE OF THIS PATIENT:34  min  POSSIBLE D/C IN 4-5 DAYS, DEPENDING ON CLINICAL CONDITION.   [name] M.D on 12/22/2015 at 4:02 PM  Between 7am to 6pm - Pager - [phone]  After 6pm go to [email or web address] - password [email or web address] [facility]  Office  [phone]  CC: Primary care physician; Murlean Iba, MD   Note: This dictation was prepared with Dragon dictation along with smaller phrase technology. Any transcriptional errors that result from this process are unintentional.

## 2015-12-23 LAB — PROTIME-INR
INR: 2.23
Prothrombin Time: 25.1 seconds — ABNORMAL HIGH (ref 11.4–15.2)

## 2015-12-23 LAB — HEPARIN LEVEL (UNFRACTIONATED): Heparin Unfractionated: 0.5 IU/mL (ref 0.30–0.70)

## 2015-12-23 MED ORDER — GUAIFENESIN ER 600 MG PO TB12
1200.0000 mg | ORAL_TABLET | Freq: Two times a day (BID) | ORAL | Status: DC
  Administered 2015-12-23 – 2015-12-28 (×10): 1200 mg via ORAL
  Filled 2015-12-23 (×10): qty 2

## 2015-12-23 MED ORDER — ALPRAZOLAM 0.5 MG PO TABS
0.5000 mg | ORAL_TABLET | Freq: Two times a day (BID) | ORAL | Status: DC | PRN
  Administered 2015-12-23 – 2015-12-24 (×2): 0.5 mg via ORAL
  Filled 2015-12-23 (×2): qty 1

## 2015-12-23 NOTE — Progress Notes (Signed)
ANTICOAGULATION CONSULT NOTE - Follow Up Consult  Pharmacy Consult for Heparin Drip and warfarin  Indication: hx of VTE  Allergies  Allergen Reactions  . Adhesive [Tape] Itching    Silk tape is ok to use.    Patient Measurements: Height: 5\' 6"  (167.6 cm) Weight: 139 lb 11.2 oz (63.4 kg) IBW/kg (Calculated) : 59.3 Heparin Dosing Weight:   Vital Signs: Temp: 98.6 F (37 C) (12/16 0759) Temp Source: Oral (12/16 0759) BP: 139/79 (12/16 0759) Pulse Rate: 90 (12/16 0759)  Labs:  Recent Labs  12/21/15 0607  12/21/15 1516 12/22/15 0603 12/23/15 0530  HGB 7.2*  --   --  7.1*  --   HCT 21.6*  --   --  21.5*  --   PLT 143*  --   --  148*  --   LABPROT 16.6*  --   --  20.1* 25.1*  INR 1.33  --   --  1.69 2.23  HEPARINUNFRC  --   < > 0.32 0.40 0.50  CREATININE 3.41*  --   --  5.51*  --   < > = values in this interval not displayed.  Estimated Creatinine Clearance: 10 mL/min (by C-G formula based on SCr of 5.51 mg/dL (H)).   Assessment:  Pharmacy consulted for heparin drip management for 60 yo female transferred to ICU due to flash pulmonary edema and requiring emergent intubation. Pharmacy previously consulted for warfarin management; patient is currently subtherapeutic. Patient has history significant for DVT x 2 and required warfarin as an outpatient. Patient initiated on heparin drip went transitioned to ICU. Heparin drip was held due to therapeutic INR. Port removal today 12/13. INR now subtherapeutic at 1.61.  Pharmacy consulted for heparin drip dosing and monitoring for bridge to therapeutic INR due to PMH of DVT.   Patients home dose:  M,T,W, Th :7.5mg                                      F, Sa, Sun: 5mg     DATE:          INR:            DOSE  12/13            1.61        7.5mg  12/14          1.33      7.5mg   12/15           1.69             7.5mg        12/16             2.23             5 mg    Goal of Therapy:  Anti-Xa 0.3-0.7  INR: 2-3 Monitor platelets by  anticoagulation protocol: Yes   Plan:  Heparin 900units/hr. No bolus. Will check Anti-Xa in 6 hours.   Will start patient on home dose of warfarin 7.5mg  M,T,W,Th and 5mg  F,Sat,Sun.   INRs ordered daily. Heparin drip to be stopped after INR therapeutic.   12/13:  HL @ 22:45 = 0.21 .   Will order heparin 900 units IV X 1 bolus and increase drip rate to 1100 units/hr.  Will recheck HL 8 hrs after rate change.   12/14 HL: 0.43 . Will continue heparin drip at 1100 units/hr. Will recheck heparin level in 6  hours for confirmation. INR subtherapeutic at 1.33. Will continue with patients home dose of 7.5mg  today. Expect INR to start trending up in next 48 hours.  12/14 HL: 0.32. This is the second level with in therapeutic range. Will continue heparin drip at 1100 units/hr. HL and CBC ordered with AM labs.   12/15 HL @ 0600 = 0.4,  INR = 1.69.    Will continue this pt on current drip rate and recheck INR and HL on 12/16 with AM labs. INR being to increase, still subtherapeutic. Will give the patient an extra 2.5mg  tonight on top of her regular scheduled 5mg  tablet.   12/16 @ 06:00= 0.50; INR= 2.23. Will continue current heparin gtt rate. Will recheck heparin level in am. Will give warfarin 5 mg PO x 1 dose today (home dose). Will recheck PT/INR in am.   Pharmacy will continue to monitor and adjust per consult.    Larene Beach, PharmD Clinical Pharmacist 12/23/2015 8:25 AM

## 2015-12-23 NOTE — Progress Notes (Signed)
[facility] [place] NAME: Teresa Cook    MR#:  [id number]  DATE OF BIRTH:  [birth year]  SUBJECTIVE: Feels better,. on 2 L of oxygen, saturating 97%. Patient denies any shortness of breath. Hemodialysis yesterday feeling weak and tired otherwise no complaints   CHIEF COMPLAINT:   Chief Complaint  Patient presents with  . Weakness  Patient is feeling much better, asking for anxiolytics   REVIEW OF SYSTEMS:   ROS CONSTITUTIONAL: No fever, fatigue or weakness.  EYES: No blurred or double vision.  EARS, NOSE, AND THROAT: No tinnitus or ear pain.  RESPIRATORY:Less shortnessof breath today. CARDIOVASCULAR: No chest pain, orthopnea, edema.  GASTROINTESTINAL: No nausea, vomiting, diarrhea mild abdominal pain, Reporting constipation.  GENITOURINARY: Hemodialysis.  ENDOCRINE: No polyuria, nocturia,  HEMATOLOGY: No anemia, easy bruising or bleeding SKIN: No rash or lesion. MUSCULOSKELETAL: No joint pain or arthritis.   NEUROLOGIC: No tingling, numbness, weakness.  PSYCHIATRY: No anxiety or depression.   DRUG ALLERGIES:   Allergies  Allergen Reactions  . Adhesive [Tape] Itching    Silk tape is ok to use.    VITALS:  Blood pressure (!) 149/87, pulse 94, temperature 98.2 F (36.8 C), temperature source Oral, resp. rate 18, height 5\' 6"  (1.676 m), weight 63.4 kg (139 lb 11.2 oz), SpO2 96 %.  PHYSICAL EXAMINATION:  GENERAL:  60 y.o.-year-old patient lying in the bed with no acute distress.  EYES: Pupils equal, round, reactive to light and accommodation. No scleral icterus. Extraocular muscles intact.  HEENT: Head atraumatic, normocephalic. Oropharynx and nasopharynx clear.  NECK:  Supple, no jugular venous distention. No thyroid enlargement, no tenderness.  LUNGS:Moderate breath sounds, clear to auscultation, no wheezing, no rales.  CARDIOVASCULAR: S1, S2 normal. No murmurs, rubs, or gallops.  ABDOMEN: Soft, nontender,  nondistended. Bowel sounds present. No organomegaly or mass.  EXTREMITIES: No pedal edema, cyanosis, or clubbing.  NEUROLOGIC: Cranial nerves II through XII are intact. Muscle strength 5/5 in all extremities. Sensation intact. Gait not checked.  PSYCHIATRIC: The patient is alert and oriented x 3.  SKIN: No obvious rash, lesion, or ulcer.    LABORATORY PANEL:   CBC  Recent Labs Lab 12/22/15 0603  WBC 4.8  HGB 7.1*  HCT 21.5*  PLT 148*   ------------------------------------------------------------------------------------------------------------------  Chemistries   Recent Labs Lab 12/18/15 0502  12/22/15 0603  NA 137  < > 140  K 3.5  < > 4.2  CL 101  < > 106  CO2 29  < > 25  GLUCOSE 108*  < > 93  BUN 21*  < > 26*  CREATININE 3.91*  < > 5.51*  CALCIUM 8.8*  < > 9.3  MG 1.7  < > 2.1  AST 23  --   --   ALT 14  --   --   ALKPHOS 171*  --   --   BILITOT 0.7  --   --   < > = values in this interval not displayed. ------------------------------------------------------------------------------------------------------------------  Cardiac Enzymes  Recent Labs Lab 12/18/15 0157  TROPONINI 0.15*   ------------------------------------------------------------------------------------------------------------------  RADIOLOGY:  No results found.  EKG:   Orders placed or performed during the hospital encounter of 12/15/15  . ED EKG  . ED EKG  . EKG 12-Lead  . EKG 12-Lead  . EKG 12-Lead  . EKG 12-Lead    ASSESSMENT AND PLAN:   #.Acute Respiratory failure secondary to combination of fluid overload, multifocal pneumonia. Status post extubation . Clinically  improving continue oxygen 2 L via nasal cannula Patient is transferred [facility] floor [facility] 12/20/2015  #.  sepsis: Secondary to bacteremia  Blood cultures from 12 /9 from HD center diphtheroids. Continue vancomycin until 12/30/2015. Discontinued cefepime on 12/21/2015. procalcitonin level 89.  Cultures from  12/10 no growth, his permacath removal, per catheter tip cultures from December 9 didn't show any growth.   #.Anemia of chronic kidney disease. Hemoglobin dropped yesterday 12/20/2015, received one unit transfusion. hemoglobin 7.2 -7.1   #.history of breast cancer finished chemotherapy. Port is removed for sepsis  # essential hypertension-blood pressure is stable  #history of left leg DVT: Because of anemia stopped  Warfarin.  Transmitted 1 unit of packed RBC, hemoglobin  at 7.1  Coumadin INR at 2.23,d/c heparin drip No active bleeding  # end-stage renal disease continue hemodialysis on Monday Wednesday Friday Patient has temporary right femoral dialysis catheter at this time Planning for new permacath placement next week   # hypertensive urgency secondary to flash pulmonary edema: Improved with hemodialysis. 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 Physical therapy consult.   All the records are reviewed and case discussed with Care Management/Social Workerr. Management plans discussed with the patient, family and they are in agreement.  CODE STATUS: full  TOTAL TIME TAKING CARE OF THIS PATIENT:33  min  POSSIBLE D/C IN 4-5 DAYS, DEPENDING ON CLINICAL CONDITION.   [name] M.D on 12/23/2015 at 5:30 PM  Between 7am to 6pm - Pager - [phone]  After 6pm go to [email or web address] - password [email or web address] [facility]  Office  [phone]  CC: Primary care physician; Murlean Iba, MD   Note: This dictation was prepared with Dragon dictation along with smaller phrase technology. Any transcriptional errors that result from this process are unintentional.

## 2015-12-23 NOTE — Progress Notes (Signed)
60 year old female with multiple medical issues here for pneumonia and potential port site infection. She is postoperative day #3 from a port removal. She is feeling much better this AM.  She is sitting up in the bed an conversant.  She states less tired and breathing better.  She is still coughing up some bloody sputum.   Vitals:   12/23/15 0433 12/23/15 0759  BP: 129/81 139/79  Pulse: (!) 105 90  Resp: (!) 21 18  Temp: 98 F (36.7 C) 98.6 F (37 C)   I/O last 3 completed shifts: In: 562.6 [I.V.:562.6] Out: 2669 [Other:2669] No intake/output data recorded.   PE:  Gen: NAD Res: crackles in right chest, left clear today Cardio: RRR, no murmur Abd: soft, nt, nd Ext: 2+ pulses, no edema Incision: Left chest site incision c/d/i with sterile glue in place   CBC Latest Ref Rng & Units 12/22/2015 12/21/2015 12/20/2015  WBC 3.6 - 11.0 K/uL 4.8 3.8 5.2  Hemoglobin 12.0 - 16.0 g/dL 7.1(L) 7.2(L) 7.4(L)  Hematocrit 35.0 - 47.0 % 21.5(L) 21.6(L) 22.2(L)  Platelets 150 - 440 K/uL 148(L) 143(L) 143(L)   CMP Latest Ref Rng & Units 12/22/2015 12/21/2015 12/20/2015  Glucose 65 - 99 mg/dL 93 97 104(H)  BUN 6 - 20 mg/dL 26(H) 17 21(H)  Creatinine 0.44 - 1.00 mg/dL 5.51(H) 3.41(H) 3.46(H)  Sodium 135 - 145 mmol/L 140 139 137  Potassium 3.5 - 5.1 mmol/L 4.2 3.0(L) 5.4(H)  Chloride 101 - 111 mmol/L 106 102 98(L)  CO2 22 - 32 mmol/L 25 29 29   Calcium 8.9 - 10.3 mg/dL 9.3 9.2 9.1  Total Protein 6.5 - 8.1 g/dL - - -  Total Bilirubin 0.3 - 1.2 mg/dL - - -  Alkaline Phos 38 - 126 U/L - - -  AST 15 - 41 U/L - - -  ALT 14 - 54 U/L - - -   A/P:  60 year old female with multiple medical issues here for pneumonia and potential port site infection. She is postoperative day #3 from a port removal.  She appears to be feeling much better today and in better spirits.    She reportedly is supposed to get another perm catheter placed on Monday.  I will discuss with medicine physician as well but needs to  be placed on the right side only as about to undergo bilateral mastectomy in 1 month and will need to dissect up to clavicles on left side for appropriate dissection.

## 2015-12-23 NOTE — Progress Notes (Signed)
Pt is refusing Bipap for sleep. Pt encouraged to wear Bipap and to call if she changes her mind about usage.

## 2015-12-23 NOTE — Progress Notes (Signed)
[facility]  ROUNDING NOTE   Subjective:    Patient underwent extra dialysis treatment yesterday 2660 cc of fluid was removed  Breathing is better but still has some cough    Objective:  Vital signs in last 24 hours:  Temp:  [98 F (36.7 C)-98.6 F (37 C)] 98.6 F (37 C) (12/16 0759) Pulse Rate:  [90-105] 90 (12/16 0759) Resp:  [18-21] 18 (12/16 0759) BP: (129-139)/(79-81) 139/79 (12/16 0759) SpO2:  [99 %-100 %] 99 % (12/16 0759) Weight:  [63.4 kg (139 lb 11.2 oz)] 63.4 kg (139 lb 11.2 oz) (12/16 0500)  Weight change: -1.534 kg (-3 lb 6.1 oz) Filed Weights   12/22/15 0955 12/22/15 1245 12/23/15 0500  Weight: 65.4 kg (144 lb 2.9 oz) 63.2 kg (139 lb 5.3 oz) 63.4 kg (139 lb 11.2 oz)    Intake/Output: I/O last 3 completed shifts: In: 562.6 [I.V.:562.6] Out: 2669 [Other:2669]   Intake/Output this shift:  No intake/output data recorded.  Physical Exam: General: chronically ill  ENT Moist oral mucus membranes  Eyes: anicteruc  Neck:  supple  Lungs:  Bilateral rhonchi, crackles at bases  Heart: Irregular, A Fib  Abdomen:  Soft, nontender  Extremities: no peripheral edema.  Neurologic: Alert,    Skin: No lesions  Access:  rt fem temp HD catheter 12/10 Dr. Stevenson Clinch    Basic Metabolic Panel:  Recent Labs Lab 12/17/15 1300 12/17/15 1521 12/18/15 0502 12/18/15 1801 12/19/15 0438 12/20/15 0439 12/21/15 0607 12/22/15 0603  NA 137  --  137  --  134* 137 139 140  K 4.2  --  3.5  --  3.5 5.4* 3.0* 4.2  CL 105  --  101  --  98* 98* 102 106  CO2 23  --  29  --  '27 29 29 25  ' GLUCOSE 153*  --  108*  --  89 104* 97 93  BUN 26*  --  21*  --  37* 21* 17 26*  CREATININE 7.10*  --  3.91*  --  5.60* 3.46* 3.41* 5.51*  CALCIUM 8.0*  --  8.8*  --  8.7* 9.1 9.2 9.3  MG 1.9 1.8 1.7 2.1  --   --   --  2.1  PHOS 5.1* 2.9 3.4 3.5  --   --   --  3.1    Liver Function Tests:  Recent Labs Lab 12/18/15 0502  AST 23  ALT 14  ALKPHOS 171*  BILITOT 0.7  PROT 5.3*   ALBUMIN 2.6*   No results for input(s): LIPASE, AMYLASE in the last 168 hours. No results for input(s): AMMONIA in the last 168 hours.  CBC:  Recent Labs Lab 12/17/15 1300 12/18/15 0502 12/18/15 2008 12/19/15 0438 12/20/15 0439 12/21/15 0607 12/22/15 0603  WBC 10.3 8.7  --  6.3 5.2 3.8 4.8  NEUTROABS 8.9*  --   --   --  3.6  --   --   HGB 7.7* 6.1* 7.1* 7.0* 7.4* 7.2* 7.1*  HCT 23.1* 18.3* 21.3* 20.9* 22.2* 21.6* 21.5*  MCV 85.3 85.5  --  83.5 83.0 83.7 85.0  PLT 178 119*  --  113* 143* 143* 148*    Cardiac Enzymes:  Recent Labs Lab 12/17/15 1300 12/17/15 1820 12/18/15 0157  TROPONINI 0.06* 0.11* 0.15*    BNP: Invalid input(s): POCBNP  CBG:  Recent Labs Lab 12/19/15 2357 12/20/15 0408 12/20/15 1958 12/21/15 0118 12/21/15 0439  GLUCAP 118* 91 114* 92 105*    Microbiology: Results for orders  placed or performed during the hospital encounter of 12/15/15  Blood Culture (routine x 2)     Status: None   Collection Time: 12/15/15  7:52 PM  Result Value Ref Range Status   Specimen Description BLOOD LEFT ASSIST CONTROL  Final   Special Requests   Final    BOTTLES DRAWN AEROBIC AND ANAEROBIC 12CCAERO,14CCANA   Culture NO GROWTH 5 DAYS  Final   Report Status 12/20/2015 FINAL  Final  Blood Culture (routine x 2)     Status: None   Collection Time: 12/15/15  7:53 PM  Result Value Ref Range Status   Specimen Description BLOOD L HAND  Final   Special Requests   Final    BOTTLES DRAWN AEROBIC AND ANAEROBIC AER 11CC, ANA 10CC   Culture NO GROWTH 5 DAYS  Final   Report Status 12/20/2015 FINAL  Final  Urine culture     Status: None   Collection Time: 12/15/15  8:42 PM  Result Value Ref Range Status   Specimen Description URINE, RANDOM  Final   Special Requests NONE  Final   Culture NO GROWTH Performed at [facility]   Final   Report Status 12/17/2015 FINAL  Final  Culture, sputum-assessment     Status: None   Collection Time: 12/15/15 11:31 PM   Result Value Ref Range Status   Specimen Description EXPECTORATED SPUTUM  Final   Special Requests Immunocompromised  Final   Sputum evaluation   Final    Sputum specimen not acceptable for testing.  Please recollect.   [name] AT 4656 12/16/15.PMH   Report Status 12/16/2015 FINAL  Final  MRSA PCR Screening     Status: None   Collection Time: 12/15/15 11:31 PM  Result Value Ref Range Status   MRSA by PCR NEGATIVE NEGATIVE Final    Comment:        The GeneXpert MRSA Assay (FDA approved for NASAL specimens only), is one component of a comprehensive MRSA colonization surveillance program. It is not intended to diagnose MRSA infection nor to guide or monitor treatment for MRSA infections.   Cath Tip Culture     Status: None   Collection Time: 12/16/15  3:30 PM  Result Value Ref Range Status   Specimen Description HEMODIALYSIS CATHETER  Final   Special Requests NONE  Final   Culture   Final    NO GROWTH 3 DAYS Performed at [facility]    Report Status 12/20/2015 FINAL  Final  MRSA PCR Screening     Status: None   Collection Time: 12/17/15 11:32 AM  Result Value Ref Range Status   MRSA by PCR NEGATIVE NEGATIVE Final    Comment:        The GeneXpert MRSA Assay (FDA approved for NASAL specimens only), is one component of a comprehensive MRSA colonization surveillance program. It is not intended to diagnose MRSA infection nor to guide or monitor treatment for MRSA infections.   Culture, blood (single) w Reflex to ID Panel     Status: None   Collection Time: 12/17/15  5:30 PM  Result Value Ref Range Status   Specimen Description PORTA CATH  Final   Special Requests BOTTLES DRAWN AEROBIC AND ANAEROBIC 5ML  Final   Culture NO GROWTH 5 DAYS  Final   Report Status 12/22/2015 FINAL  Final    Coagulation Studies:  Recent Labs  12/21/15 0607 12/22/15 0603 12/23/15 0530  LABPROT 16.6* 20.1* 25.1*  INR 1.33 1.69 2.23  Urinalysis: No results for  input(s): COLORURINE, LABSPEC, PHURINE, GLUCOSEU, HGBUR, BILIRUBINUR, KETONESUR, PROTEINUR, UROBILINOGEN, NITRITE, LEUKOCYTESUR in the last 72 hours.  Invalid input(s): APPERANCEUR    Imaging: No results found.   Medications:   . heparin 1,100 Units/hr (12/23/15 1347)   . aspirin EC  81 mg Oral Daily  . clonazePAM  1 mg Oral QHS  . feeding supplement (ENSURE ENLIVE)  237 mL Oral TID BM  . guaiFENesin  1,200 mg Oral BID  . lidocaine  20 mL Intradermal Once  . losartan  100 mg Oral Once per day on Sun Tue Thu Sat  . mouth rinse  15 mL Mouth Rinse BID  . metoprolol  50 mg Oral 2 times per day on Sun Tue Thu Sat  . pantoprazole  40 mg Oral BID AC  . sertraline  50 mg Oral QHS  . sodium chloride flush  10-40 mL Intracatheter Q12H  . vancomycin  750 mg Intravenous Q M,W,F-HD  . warfarin  7.5 mg Oral Once per day on Mon Tue Wed Thu   And  . warfarin  5 mg Oral Once per day on Sun Fri Sat  . Warfarin - Pharmacist Dosing Inpatient   Does not apply q1800   acetaminophen **OR** acetaminophen, benzonatate, bisacodyl, diphenhydrAMINE, heparin lock flush, heparin lock flush, HYDROcodone-acetaminophen, ipratropium-albuterol, metoprolol, phenol, senna-docusate, sodium chloride flush, sodium chloride flush, zolpidem  Assessment/ Plan:  [name] is a 60 y.o. 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  1. ESRD on HD MWF: with acute pulmonary edema  Emergent HD on Sunday. 2600cc fluid removed yesterday total 9000 cc removed so far during this hospitalization Now has rt fem temp dialysis cathter Permcath next week once all infections are cleared CXR in AM May need HD again tomorrow  2. Bacteremia/sepsis:  12/9 gram positive bacilli from [place].- Corynebacterium Permcath removed. Blood cultures and cath tip cultures are no growth  chemotherapy infusion port removed this admission  3. Anemia of chronic kidney disease: hemoglobin 7.1 - Avoid Epogen given recent diagnosis of breast cancer. - blood transfusion given this admission - consider another blood transfusion with HD tomorrow since patient is somewhat dyspneic  4. Secondary hyperparathyroidism. -  hold cinacalcet and sevelamer for now - Phos 3.1     LOS: 8 Srijan Givan 12/16/20172:41 PM

## 2015-12-24 ENCOUNTER — Inpatient Hospital Stay: Payer: Medicare Other

## 2015-12-24 LAB — PROTIME-INR
INR: 2
PROTHROMBIN TIME: 23 s — AB (ref 11.4–15.2)

## 2015-12-24 LAB — CBC
HCT: 25.1 % — ABNORMAL LOW (ref 35.0–47.0)
Hemoglobin: 8.3 g/dL — ABNORMAL LOW (ref 12.0–16.0)
MCH: 27.7 pg (ref 26.0–34.0)
MCHC: 33.1 g/dL (ref 32.0–36.0)
MCV: 83.7 fL (ref 80.0–100.0)
PLATELETS: 187 10*3/uL (ref 150–440)
RBC: 3 MIL/uL — AB (ref 3.80–5.20)
RDW: 17.3 % — AB (ref 11.5–14.5)
WBC: 7.2 10*3/uL (ref 3.6–11.0)

## 2015-12-24 MED ORDER — WARFARIN SODIUM 7.5 MG PO TABS: 7.5000 mg | ORAL_TABLET | Freq: Once | ORAL | Status: DC

## 2015-12-24 MED ORDER — WARFARIN SODIUM 7.5 MG PO TABS: 7.5000 mg | ORAL_TABLET | ORAL | Status: DC

## 2015-12-24 MED ORDER — WARFARIN SODIUM 5 MG PO TABS: 5.0000 mg | ORAL_TABLET | ORAL | Status: DC

## 2015-12-24 MED ORDER — WARFARIN SODIUM 5 MG PO TABS: 2.5000 mg | ORAL_TABLET | Freq: Once | ORAL | Status: DC

## 2015-12-24 NOTE — Progress Notes (Signed)
Pre hd 

## 2015-12-24 NOTE — Progress Notes (Signed)
60 year old female with multiple medical issues here for pneumonia and potential port site infection. She is postoperative day #4 from a port removal. She has been feeling better today.  She is still coughing up some sputum but improved.  She is anxious about having another perm cath placed.   Vitals:   12/24/15 1150 12/24/15 1414  BP: 131/82 131/80  Pulse: (!) 101 (!) 101  Resp: 18 (!) 22  Temp: 98.3 F (36.8 C) 98.4 F (36.9 C)   I/O last 3 completed shifts: In: 360 [P.O.:360] Out: 1950 [Other:1950] No intake/output data recorded.   PE:  Gen: NAD Res: crackles in right chest, left clear today Cardio: RRR, no murmur Abd: soft, nt, nd Ext: 2+ pulses, no edema Incision: Left chest site incision c/d/i with sterile glue in place   CBC Latest Ref Rng & Units 12/24/2015 12/22/2015 12/21/2015  WBC 3.6 - 11.0 K/uL 7.2 4.8 3.8  Hemoglobin 12.0 - 16.0 g/dL 8.3(L) 7.1(L) 7.2(L)  Hematocrit 35.0 - 47.0 % 25.1(L) 21.5(L) 21.6(L)  Platelets 150 - 440 K/uL 187 148(L) 143(L)   CMP Latest Ref Rng & Units 12/22/2015 12/21/2015 12/20/2015  Glucose 65 - 99 mg/dL 93 97 104(H)  BUN 6 - 20 mg/dL 26(H) 17 21(H)  Creatinine 0.44 - 1.00 mg/dL 5.51(H) 3.41(H) 3.46(H)  Sodium 135 - 145 mmol/L 140 139 137  Potassium 3.5 - 5.1 mmol/L 4.2 3.0(L) 5.4(H)  Chloride 101 - 111 mmol/L 106 102 98(L)  CO2 22 - 32 mmol/L 25 29 29   Calcium 8.9 - 10.3 mg/dL 9.3 9.2 9.1  Total Protein 6.5 - 8.1 g/dL - - -  Total Bilirubin 0.3 - 1.2 mg/dL - - -  Alkaline Phos 38 - 126 U/L - - -  AST 15 - 41 U/L - - -  ALT 14 - 54 U/L - - -   A/P:  60 year old female with multiple medical issues here for pneumonia and potential port site infection. She is postoperative day #4 from a port removal.  She appears to be feeling much better today and in better spirits.    She reportedly is supposed to get another perm catheter placed on Monday.  I will discuss with medicine physician as well but needs to be placed on the right side  only as about to undergo bilateral mastectomy in 1 month and will need to dissect up to clavicles on left side for appropriate dissection.

## 2015-12-24 NOTE — Progress Notes (Signed)
[facility]  ROUNDING NOTE   Subjective:   Patient seen during dialysis Tolerating well    HEMODIALYSIS FLOWSHEET:  Blood Flow Rate (mL/min): 300 mL/min Arterial Pressure (mmHg): -150 mmHg Venous Pressure (mmHg): 120 mmHg Transmembrane Pressure (mmHg): 40 mmHg Ultrafiltration Rate (mL/min): 0 mL/min Dialysate Flow Rate (mL/min): 800 ml/min Conductivity: Machine : 14.8 Conductivity: Machine : 14.8 Dialysis Fluid Bolus: Normal Saline Bolus Amount (mL): 250 mL Dialysate Change:  (3k) Intra-Hemodialysis Comments: 2800. tx ended  + cough    Objective:  Vital signs in last 24 hours:  Temp:  [98 F (36.7 C)-98.9 F (37.2 C)] 98.3 F (36.8 C) (12/17 1150) Pulse Rate:  [84-108] 101 (12/17 1150) Resp:  [15-28] 18 (12/17 1150) BP: (111-156)/(68-118) 131/82 (12/17 1150) SpO2:  [95 %-100 %] 95 % (12/17 1150) Weight:  [58.3 kg (128 lb 8.5 oz)-61.7 kg (136 lb 0.4 oz)] 58.3 kg (128 lb 8.5 oz) (12/17 1110)  Weight change:  Filed Weights   12/23/15 0500 12/24/15 0750 12/24/15 1110  Weight: 63.4 kg (139 lb 11.2 oz) 61.7 kg (136 lb 0.4 oz) 58.3 kg (128 lb 8.5 oz)    Intake/Output: I/O last 3 completed shifts: In: 316 [P.O.:120; I.V.:196] Out: -    Intake/Output this shift:  Total I/O In: 0  Out: 1950 [Other:1950]  Physical Exam: General: chronically ill  ENT Moist oral mucus membranes  Eyes: anicteruc  Neck:  supple  Lungs:  Bilateral rhonchi, crackles at bases  Heart: Irregular, A Fib  Abdomen:  Soft, nontender  Extremities: no peripheral edema.  Neurologic: Alert,    Skin: No lesions  Access:  Fem temp HD catheter 12/10 Dr. Stevenson Clinch    Basic Metabolic Panel:  Recent Labs Lab 12/17/15 1300 12/17/15 1521 12/18/15 0502 12/18/15 1801 12/19/15 0438 12/20/15 0439 12/21/15 0607 12/22/15 0603  NA 137  --  137  --  134* 137 139 140  K 4.2  --  3.5  --  3.5 5.4* 3.0* 4.2  CL 105  --  101  --  98* 98* 102 106  CO2 23  --  29  --  '27 29 29 25   ' GLUCOSE 153*  --  108*  --  89 104* 97 93  BUN 26*  --  21*  --  37* 21* 17 26*  CREATININE 7.10*  --  3.91*  --  5.60* 3.46* 3.41* 5.51*  CALCIUM 8.0*  --  8.8*  --  8.7* 9.1 9.2 9.3  MG 1.9 1.8 1.7 2.1  --   --   --  2.1  PHOS 5.1* 2.9 3.4 3.5  --   --   --  3.1    Liver Function Tests:  Recent Labs Lab 12/18/15 0502  AST 23  ALT 14  ALKPHOS 171*  BILITOT 0.7  PROT 5.3*  ALBUMIN 2.6*   No results for input(s): LIPASE, AMYLASE in the last 168 hours. No results for input(s): AMMONIA in the last 168 hours.  CBC:  Recent Labs Lab 12/17/15 1300  12/19/15 0438 12/20/15 0439 12/21/15 0607 12/22/15 0603 12/24/15 0507  WBC 10.3  < > 6.3 5.2 3.8 4.8 7.2  NEUTROABS 8.9*  --   --  3.6  --   --   --   HGB 7.7*  < > 7.0* 7.4* 7.2* 7.1* 8.3*  HCT 23.1*  < > 20.9* 22.2* 21.6* 21.5* 25.1*  MCV 85.3  < > 83.5 83.0 83.7 85.0 83.7  PLT 178  < > 113* 143* 143*  148* 187  < > = values in this interval not displayed.  Cardiac Enzymes:  Recent Labs Lab 12/17/15 1300 12/17/15 1820 12/18/15 0157  TROPONINI 0.06* 0.11* 0.15*    BNP: Invalid input(s): POCBNP  CBG:  Recent Labs Lab 12/19/15 2357 12/20/15 0408 12/20/15 1958 12/21/15 0118 12/21/15 0439  GLUCAP 118* 91 114* 92 105*    Microbiology: Results for orders placed or performed during the hospital encounter of 12/15/15  Blood Culture (routine x 2)     Status: None   Collection Time: 12/15/15  7:52 PM  Result Value Ref Range Status   Specimen Description BLOOD LEFT ASSIST CONTROL  Final   Special Requests   Final    BOTTLES DRAWN AEROBIC AND ANAEROBIC 12CCAERO,14CCANA   Culture NO GROWTH 5 DAYS  Final   Report Status 12/20/2015 FINAL  Final  Blood Culture (routine x 2)     Status: None   Collection Time: 12/15/15  7:53 PM  Result Value Ref Range Status   Specimen Description BLOOD L HAND  Final   Special Requests   Final    BOTTLES DRAWN AEROBIC AND ANAEROBIC AER 11CC, ANA 10CC   Culture NO GROWTH 5 DAYS   Final   Report Status 12/20/2015 FINAL  Final  Urine culture     Status: None   Collection Time: 12/15/15  8:42 PM  Result Value Ref Range Status   Specimen Description URINE, RANDOM  Final   Special Requests NONE  Final   Culture NO GROWTH Performed at [facility]   Final   Report Status 12/17/2015 FINAL  Final  Culture, sputum-assessment     Status: None   Collection Time: 12/15/15 11:31 PM  Result Value Ref Range Status   Specimen Description EXPECTORATED SPUTUM  Final   Special Requests Immunocompromised  Final   Sputum evaluation   Final    Sputum specimen not acceptable for testing.  Please recollect.   [name] AT 7681 12/16/15.PMH   Report Status 12/16/2015 FINAL  Final  MRSA PCR Screening     Status: None   Collection Time: 12/15/15 11:31 PM  Result Value Ref Range Status   MRSA by PCR NEGATIVE NEGATIVE Final    Comment:        The GeneXpert MRSA Assay (FDA approved for NASAL specimens only), is one component of a comprehensive MRSA colonization surveillance program. It is not intended to diagnose MRSA infection nor to guide or monitor treatment for MRSA infections.   Cath Tip Culture     Status: None   Collection Time: 12/16/15  3:30 PM  Result Value Ref Range Status   Specimen Description HEMODIALYSIS CATHETER  Final   Special Requests NONE  Final   Culture   Final    NO GROWTH 3 DAYS Performed at [facility]    Report Status 12/20/2015 FINAL  Final  MRSA PCR Screening     Status: None   Collection Time: 12/17/15 11:32 AM  Result Value Ref Range Status   MRSA by PCR NEGATIVE NEGATIVE Final    Comment:        The GeneXpert MRSA Assay (FDA approved for NASAL specimens only), is one component of a comprehensive MRSA colonization surveillance program. It is not intended to diagnose MRSA infection nor to guide or monitor treatment for MRSA infections.   Culture, blood (single) w Reflex to ID Panel     Status: None   Collection  Time: 12/17/15  5:30 PM  Result Value  Ref Range Status   Specimen Description PORTA CATH  Final   Special Requests BOTTLES DRAWN AEROBIC AND ANAEROBIC 5ML  Final   Culture NO GROWTH 5 DAYS  Final   Report Status 12/22/2015 FINAL  Final    Coagulation Studies:  Recent Labs  12/22/15 0603 12/23/15 0530 12/24/15 0507  LABPROT 20.1* 25.1* 23.0*  INR 1.69 2.23 2.00    Urinalysis: No results for input(s): COLORURINE, LABSPEC, PHURINE, GLUCOSEU, HGBUR, BILIRUBINUR, KETONESUR, PROTEINUR, UROBILINOGEN, NITRITE, LEUKOCYTESUR in the last 72 hours.  Invalid input(s): APPERANCEUR    Imaging: Dg Chest 2 View  Result Date: 12/24/2015 CLINICAL DATA:  Generalized weakness.  Breast cancer.  CHF, asthma EXAM: CHEST  2 VIEW COMPARISON:  12/21/2015 FINDINGS: Heart is upper limits normal in size. Calcifications in the aortic arch. Lungs are clear. No effusions or edema. No acute bony abnormality. IMPRESSION: No active cardiopulmonary disease. Electronically Signed   By: Rolm Baptise M.D.   On: 12/24/2015 11:48     Medications:    . aspirin EC  81 mg Oral Daily  . clonazePAM  1 mg Oral QHS  . feeding supplement (ENSURE ENLIVE)  237 mL Oral TID BM  . guaiFENesin  1,200 mg Oral BID  . lidocaine  20 mL Intradermal Once  . losartan  100 mg Oral Once per day on Sun Tue Thu Sat  . mouth rinse  15 mL Mouth Rinse BID  . metoprolol  50 mg Oral 2 times per day on Sun Tue Thu Sat  . pantoprazole  40 mg Oral BID AC  . sertraline  50 mg Oral QHS  . sodium chloride flush  10-40 mL Intracatheter Q12H  . vancomycin  750 mg Intravenous Q M,W,F-HD  . [START ON 12/25/2015] warfarin  5 mg Oral Once per day on Sun Fri Sat   And  . [START ON 12/25/2015] warfarin  7.5 mg Oral Once per day on Mon Tue Wed Thu  . warfarin  7.5 mg Oral ONCE-1800  . Warfarin - Pharmacist Dosing Inpatient   Does not apply q1800   acetaminophen **OR** acetaminophen, ALPRAZolam, benzonatate, bisacodyl, diphenhydrAMINE, heparin lock  flush, heparin lock flush, HYDROcodone-acetaminophen, ipratropium-albuterol, metoprolol, phenol, senna-docusate, sodium chloride flush, sodium chloride flush, zolpidem  Assessment/ Plan:  Teresa Cook is a 60 y.o. 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  1. ESRD on HD MWF: with acute pulmonary edema  Emergent HD last Sunday via fem temp dialysis cathter Permcath next week once all infections are cleared CXR post dialysis  2. Bacteremia/sepsis: 12/9 gram positive bacilli from [place].- Corynebacterium Permcath removed. Blood cultures and cath tip cultures are no growth  chemotherapy infusion port removed this admission No fever, CXR clear NPO for possible permcath placement in AM  3. Anemia of chronic kidney disease: hemoglobin 7.1 - Avoid Epogen given recent diagnosis of breast cancer. - blood transfusion given this admission - consider another blood transfusion with HD tomorrow since patient is somewhat dyspneic  4. Secondary hyperparathyroidism. -  hold cinacalcet and sevelamer for now - Phos 3.1     LOS: 9 Teresa Cook 12/17/201712:42 PM

## 2015-12-24 NOTE — Progress Notes (Signed)
Post hd assessment 

## 2015-12-24 NOTE — Progress Notes (Signed)
  End of hd 

## 2015-12-24 NOTE — Progress Notes (Signed)
[place] consult for warfarin dosing in this 83 yoF with history of VTE. Per Dr. Candiss Norse, Permcath will be placed this week (Monday or Tuesday). Will hold warfarin until after procedure and then pharmacy is to re-initiate warfarin dosing.  Darrow Bussing, PharmD Pharmacy Resident 12/24/2015 1:33 PM

## 2015-12-24 NOTE — Progress Notes (Signed)
Pt is refusing Bipap for sleep.

## 2015-12-24 NOTE — Progress Notes (Signed)
[place] [facility] [place] NAME: Teresa Cook    MR#:  [id number]  DATE OF BIRTH:  1955-09-05  SUBJECTIVE: Feels better,. on 2 L of oxygen, saturating 97%. Patient denies any shortness of breath. Hemodialysis yesterday feeling weak and tired otherwise no complaints   CHIEF COMPLAINT:   Chief Complaint  Patient presents with  . Weakness  Patient is Seen and examined after hemodialysis. Sleepy  REVIEW OF SYSTEMS:   ROS CONSTITUTIONAL: No fever, fatigue or weakness.  EYES: No blurred or double vision.  EARS, NOSE, AND THROAT: No tinnitus or ear pain.  RESPIRATORY:Less shortnessof breath today. CARDIOVASCULAR: No chest pain, orthopnea, edema.  GASTROINTESTINAL: No nausea, vomiting, diarrhea mild abdominal pain, Reporting constipation.  GENITOURINARY: Hemodialysis.  ENDOCRINE: No polyuria, nocturia,  HEMATOLOGY: No anemia, easy bruising or bleeding SKIN: No rash or lesion. MUSCULOSKELETAL: No joint pain or arthritis.   NEUROLOGIC: No tingling, numbness, weakness.  PSYCHIATRY: No anxiety or depression.   DRUG ALLERGIES:   Allergies  Allergen Reactions  . Adhesive [Tape] Itching    Silk tape is ok to use.    VITALS:  Blood pressure 131/82, pulse (!) 101, temperature 98.3 F (36.8 C), temperature source Oral, resp. rate 18, height 5\' 6"  (1.676 m), weight 58.3 kg (128 lb 8.5 oz), SpO2 95 %.  PHYSICAL EXAMINATION:  GENERAL:  60 y.o.-year-old patient lying in the bed with no acute distress.  EYES: Pupils equal, round, reactive to light and accommodation. No scleral icterus. Extraocular muscles intact.  HEENT: Head atraumatic, normocephalic. Oropharynx and nasopharynx clear.  NECK:  Supple, no jugular venous distention. No thyroid enlargement, no tenderness.  LUNGS:Moderate breath sounds, clear to auscultation, no wheezing, no rales.  CARDIOVASCULAR: S1, S2 normal. No murmurs, rubs, or gallops.  ABDOMEN: Soft, nontender,  nondistended. Bowel sounds present. No organomegaly or mass.  EXTREMITIES: No pedal edema, cyanosis, or clubbing.  NEUROLOGIC: Cranial nerves II through XII are intact. Muscle strength 5/5 in all extremities. Sensation intact. Gait not checked.  PSYCHIATRIC: The patient is alert and oriented x 3.  SKIN: No obvious rash, lesion, or ulcer.    LABORATORY PANEL:   CBC  Recent Labs Lab 12/24/15 0507  WBC 7.2  HGB 8.3*  HCT 25.1*  PLT 187   ------------------------------------------------------------------------------------------------------------------  Chemistries   Recent Labs Lab 12/18/15 0502  12/22/15 0603  NA 137  < > 140  K 3.5  < > 4.2  CL 101  < > 106  CO2 29  < > 25  GLUCOSE 108*  < > 93  BUN 21*  < > 26*  CREATININE 3.91*  < > 5.51*  CALCIUM 8.8*  < > 9.3  MG 1.7  < > 2.1  AST 23  --   --   ALT 14  --   --   ALKPHOS 171*  --   --   BILITOT 0.7  --   --   < > = values in this interval not displayed. ------------------------------------------------------------------------------------------------------------------  Cardiac Enzymes  Recent Labs Lab 12/18/15 0157  TROPONINI 0.15*   ------------------------------------------------------------------------------------------------------------------  RADIOLOGY:  Dg Chest 2 View  Result Date: 12/24/2015 CLINICAL DATA:  Generalized weakness.  Breast cancer.  CHF, asthma EXAM: CHEST  2 VIEW COMPARISON:  12/21/2015 FINDINGS: Heart is upper limits normal in size. Calcifications in the aortic arch. Lungs are clear. No effusions or edema. No acute bony abnormality. IMPRESSION: No active cardiopulmonary disease. Electronically Signed   By: Rolm Baptise M.D.  On: 12/24/2015 11:48    EKG:   Orders placed or performed during the hospital encounter of 12/15/15  . ED EKG  . ED EKG  . EKG 12-Lead  . EKG 12-Lead  . EKG 12-Lead  . EKG 12-Lead    ASSESSMENT AND PLAN:   #.Acute Respiratory failure secondary to  combination of fluid overload, multifocal pneumonia. Status post extubation . Clinically improving continue oxygen 2 L via nasal cannula Patient is transferred to floor from the [facility] 12/20/2015  #.  sepsis: Secondary to bacteremia  Blood cultures from 12 /9 from HD center diphtheroids. Continue vancomycin until 12/30/2015. Discontinued cefepime on 12/21/2015. procalcitonin level 89.  Cultures from 12/10 no growth, his permacath removal, per catheter tip cultures from December 9 didn't show any growth. -Patient is afebrile, WBC count is normal and cultures with no growth planning for new permacath placement tomorrow. Holding Coumadin. Nothing by mouth after midnight. Dr. Heath Lark has recommended new permacath placement on the right side of the chest only, we will talk to Dr. Ola Spurr in a.m. regarding his opinion with new permacath placement   #.Anemia of chronic kidney disease. Hemoglobin dropped yesterday 12/20/2015, received one unit transfusion. hemoglobin 7.2 -7.1 -8.3    #.history of breast cancer finished chemotherapy. Port is removed for sepsis  # essential hypertension-blood pressure is stable  #history of left leg DVT: Because of anemia stopped  Warfarin.  Transmitted 1 unit of packed RBC, hemoglobin  at 7.1  Coumadin INR at 2.23,d/c heparin drip No active bleeding Holding coumadin for possible permacath placement tomorrow  # end-stage renal disease continue hemodialysis on Monday Wednesday Friday Patient has temporary right femoral dialysis catheter at this time Planning for new permacath placement next week   # hypertensive urgency secondary to flash pulmonary edema: Improved with hemodialysis. 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 Physical  therapy consult.   All the records are reviewed and case discussed with Care Management/Social Workerr. Management plans discussed with the patient, family and they are in agreement.  CODE STATUS: full  TOTAL TIME TAKING CARE OF THIS PATIENT:32 min  POSSIBLE D/C IN 4-5 DAYS, DEPENDING ON CLINICAL CONDITION.   [name] M.D on 12/24/2015 at 1:44 PM  Between 7am to 6pm - Pager - [phone]  After 6pm go to [email or web address] - password [email or web address] [facility]  Office  [phone]  CC: Primary care physician; Murlean Iba, MD   Note: This dictation was prepared with Dragon dictation along with smaller phrase technology. Any transcriptional errors that result from this process are unintentional.

## 2015-12-24 NOTE — Progress Notes (Addendum)
ANTICOAGULATION CONSULT NOTE - Follow Up Consult  Pharmacy Consult for warfarin  Indication: hx of VTE   Allergies  Allergen Reactions  . Adhesive [Tape] Itching    Silk tape is ok to use.    Patient Measurements: Height: 5\' 6"  (167.6 cm) Weight: 136 lb 0.4 oz (61.7 kg) IBW/kg (Calculated) : 59.3 Heparin Dosing Weight:   Vital Signs: Temp: 98.9 F (37.2 C) (12/17 0750) Temp Source: Oral (12/17 0750) BP: 132/118 (12/17 0856) Pulse Rate: 108 (12/17 0856)  Labs:  Recent Labs  12/21/15 1516 12/22/15 0603 12/23/15 0530 12/24/15 0507  HGB  --  7.1*  --  8.3*  HCT  --  21.5*  --  25.1*  PLT  --  148*  --  187  LABPROT  --  20.1* 25.1* 23.0*  INR  --  1.69 2.23 2.00  HEPARINUNFRC 0.32 0.40 0.50  --   CREATININE  --  5.51*  --   --     Estimated Creatinine Clearance: 10 mL/min (by C-G formula based on SCr of 5.51 mg/dL (H)).   Assessment:  Pharmacy consulted for heparin drip management for 60 yo female transferred to ICU due to flash pulmonary edema and requiring emergent intubation. Pharmacy previously consulted for warfarin management; patient is currently subtherapeutic. Patient has history significant for DVT x 2 and required warfarin as an outpatient. Patient initiated on heparin drip went transitioned to ICU. Heparin drip was held due to therapeutic INR. Port removal today 12/13. INR now subtherapeutic at 1.61.  Pharmacy consulted for heparin drip dosing and monitoring for bridge to therapeutic INR due to PMH of DVT.   Patients home dose:  M,T,W, Th :7.5mg                                      F, Sa, Sun: 5mg     DATE:          INR:            DOSE  12/13            1.61        7.5mg  12/14          1.33      7.5mg   12/15           1.69             7.5mg        12/16             2.23             5 mg    Goal of Therapy:  Anti-Xa 0.3-0.7  INR: 2-3 Monitor platelets by anticoagulation protocol: Yes   Plan:  Heparin 900units/hr. No bolus. Will check Anti-Xa in 6  hours.   Will start patient on home dose of warfarin 7.5mg  M,T,W,Th and 5mg  F,Sat,Sun.   INRs ordered daily. Heparin drip to be stopped after INR therapeutic.   12/13:  HL @ 22:45 = 0.21 .   Will order heparin 900 units IV X 1 bolus and increase drip rate to 1100 units/hr.  Will recheck HL 8 hrs after rate change.   12/14 HL: 0.43 . Will continue heparin drip at 1100 units/hr. Will recheck heparin level in 6 hours for confirmation. INR subtherapeutic at 1.33. Will continue with patients home dose of 7.5mg  today. Expect INR to start trending up in next 48 hours.  12/14 HL: 0.32.  This is the second level with in therapeutic range. Will continue heparin drip at 1100 units/hr. HL and CBC ordered with AM labs.   12/15 HL @ 0600 = 0.4,  INR = 1.69.    Will continue this pt on current drip rate and recheck INR and HL on 12/16 with AM labs. INR being to increase, still subtherapeutic. Will give the patient an extra 2.5mg  tonight on top of her regular scheduled 5mg  tablet.   12/16 @ 06:00= 0.50; INR= 2.23. Will continue current heparin gtt rate. Will recheck heparin level in am. Will give warfarin 5 mg PO x 1 dose today (home dose). Will recheck PT/INR in am.   12/17: INR dropped to 2.0.Will give warfarin 7.5 mg PO x 1.  Will recheck PT/INR with am labs.   Pharmacy will continue to monitor and adjust per consult.    Larene Beach, PharmD Clinical Pharmacist 12/24/2015 9:17 AM

## 2015-12-24 NOTE — Care Management Important Message (Signed)
Important Message  Patient Details  Name: Teresa Cook MRN: [id number] Date of Birth: October 19, 1955   Medicare Important Message Given:  Yes    Lennon Richins A, RN 12/24/2015, 4:48 PM

## 2015-12-24 NOTE — Progress Notes (Signed)
Pre hd assessment  

## 2015-12-25 ENCOUNTER — Encounter
Admission: EM | Disposition: A | Discharge status: Home Health | Payer: Self-pay | Source: Home / Self Care | Attending: Internal Medicine

## 2015-12-25 DIAGNOSIS — I509 Heart failure, unspecified: Secondary | ICD-10-CM

## 2015-12-25 DIAGNOSIS — Z86718 Personal history of other venous thrombosis and embolism: Secondary | ICD-10-CM

## 2015-12-25 DIAGNOSIS — F419 Anxiety disorder, unspecified: Secondary | ICD-10-CM

## 2015-12-25 DIAGNOSIS — I10 Essential (primary) hypertension: Secondary | ICD-10-CM

## 2015-12-25 DIAGNOSIS — I129 Hypertensive chronic kidney disease with stage 1 through stage 4 chronic kidney disease, or unspecified chronic kidney disease: Secondary | ICD-10-CM

## 2015-12-25 DIAGNOSIS — N189 Chronic kidney disease, unspecified: Secondary | ICD-10-CM

## 2015-12-25 DIAGNOSIS — I132 Hypertensive heart and chronic kidney disease with heart failure and with stage 5 chronic kidney disease, or end stage renal disease: Secondary | ICD-10-CM

## 2015-12-25 DIAGNOSIS — D573 Sickle-cell trait: Secondary | ICD-10-CM

## 2015-12-25 DIAGNOSIS — C50212 Malignant neoplasm of upper-inner quadrant of left female breast: Secondary | ICD-10-CM

## 2015-12-25 HISTORY — PX: PERIPHERAL VASCULAR CATHETERIZATION: SHX172C

## 2015-12-25 LAB — CBC
HCT: 26 % — ABNORMAL LOW (ref 35.0–47.0)
HEMOGLOBIN: 8.7 g/dL — AB (ref 12.0–16.0)
MCH: 28.2 pg (ref 26.0–34.0)
MCHC: 33.6 g/dL (ref 32.0–36.0)
MCV: 83.9 fL (ref 80.0–100.0)
PLATELETS: 199 10*3/uL (ref 150–440)
RBC: 3.1 MIL/uL — ABNORMAL LOW (ref 3.80–5.20)
RDW: 17.6 % — ABNORMAL HIGH (ref 11.5–14.5)
WBC: 9.2 10*3/uL (ref 3.6–11.0)

## 2015-12-25 LAB — BASIC METABOLIC PANEL
Anion gap: 11 (ref 5–15)
BUN: 27 mg/dL — ABNORMAL HIGH (ref 6–20)
CALCIUM: 10 mg/dL (ref 8.9–10.3)
CHLORIDE: 97 mmol/L — AB (ref 101–111)
CO2: 28 mmol/L (ref 22–32)
Creatinine, Ser: 4.79 mg/dL — ABNORMAL HIGH (ref 0.44–1.00)
GFR, EST AFRICAN AMERICAN: 10 mL/min — AB (ref >60)
GFR, EST NON AFRICAN AMERICAN: 9 mL/min — AB (ref >60)
Glucose, Bld: 94 mg/dL (ref 65–99)
Potassium: 4.1 mmol/L (ref 3.5–5.1)
SODIUM: 136 mmol/L (ref 135–145)

## 2015-12-25 LAB — PROTIME-INR
INR: 1.82
PROTHROMBIN TIME: 21.3 s — AB (ref 11.4–15.2)

## 2015-12-25 LAB — PHOSPHORUS: PHOSPHORUS: 4.8 mg/dL — AB (ref 2.5–4.6)

## 2015-12-25 SURGERY — DIALYSIS/PERMA CATHETER INSERTION
Anesthesia: Moderate Sedation

## 2015-12-25 MED ORDER — HEPARIN SODIUM (PORCINE) 10000 UNIT/ML IJ SOLN
INTRAMUSCULAR | Status: AC
  Filled 2015-12-25: qty 1

## 2015-12-25 MED ORDER — HEPARIN (PORCINE) IN NACL 2-0.9 UNIT/ML-% IJ SOLN
INTRAMUSCULAR | Status: AC
  Filled 2015-12-25: qty 500

## 2015-12-25 MED ORDER — LIDOCAINE HCL (PF) 1 % IJ SOLN
INTRAMUSCULAR | Status: AC
  Filled 2015-12-25: qty 30

## 2015-12-25 MED ORDER — LORAZEPAM 2 MG/ML IJ SOLN
0.5000 mg | Freq: Once | INTRAMUSCULAR | Status: AC
  Administered 2015-12-25: 0.5 mg via INTRAVENOUS
  Filled 2015-12-25: qty 1

## 2015-12-25 MED ORDER — MIDAZOLAM HCL 2 MG/2ML IJ SOLN: 4.0000 mg | Freq: Once | INTRAMUSCULAR | Status: DC

## 2015-12-25 MED ORDER — MIDAZOLAM HCL 2 MG/2ML IJ SOLN
INTRAMUSCULAR | Status: DC | PRN
Start: 2015-12-25 — End: 2015-12-25
  Administered 2015-12-25 (×3): 1 mg via INTRAVENOUS

## 2015-12-25 MED ORDER — CEFAZOLIN IN D5W 1 GM/50ML IV SOLN
1.0000 g | Freq: Once | INTRAVENOUS | Status: AC
  Administered 2015-12-25: 1 g via INTRAVENOUS
  Filled 2015-12-25: qty 50

## 2015-12-25 MED ORDER — FENTANYL CITRATE (PF) 100 MCG/2ML IJ SOLN
INTRAMUSCULAR | Status: AC
  Filled 2015-12-25: qty 2

## 2015-12-25 MED ORDER — HYDROCODONE-ACETAMINOPHEN 5-325 MG PO TABS
ORAL_TABLET | ORAL | Status: AC
  Filled 2015-12-25: qty 1

## 2015-12-25 MED ORDER — MIDAZOLAM HCL 2 MG/2ML IJ SOLN
INTRAMUSCULAR | Status: AC
  Filled 2015-12-25: qty 2

## 2015-12-25 MED ORDER — MIDAZOLAM HCL 2 MG/ML PO SYRP
ORAL_SOLUTION | ORAL | Status: AC
  Filled 2015-12-25: qty 4

## 2015-12-25 MED ORDER — FENTANYL CITRATE (PF) 100 MCG/2ML IJ SOLN
INTRAMUSCULAR | Status: DC | PRN
Start: 2015-12-25 — End: 2015-12-25
  Administered 2015-12-25 (×2): 50 ug via INTRAVENOUS
  Administered 2015-12-25: 25 ug via INTRAVENOUS

## 2015-12-25 SURGICAL SUPPLY — 6 items
CANNULA 5F STIFF (CANNULA) ×2 IMPLANT
CATH PALINDROME RT-P 15FX23CM (CATHETERS) ×2 IMPLANT
CATH PALINDROME-P 44CM KIT (CATHETERS) ×1
KIT CATH 64X44X15FR RVRS (CATHETERS) ×1 IMPLANT
PACK ANGIOGRAPHY (CUSTOM PROCEDURE TRAY) ×2 IMPLANT
SUT MNCRL AB 4-0 PS2 18 (SUTURE) ×2 IMPLANT

## 2015-12-25 NOTE — Progress Notes (Signed)
[facility]  ROUNDING NOTE   Subjective:  Patient seen at bedside. She is due for PermCath placement today. Thereafter she is due for hemodialysis.  Objective:  Vital signs in last 24 hours:  Temp:  [97.9 F (36.6 C)-98.7 F (37.1 C)] 98.7 F (37.1 C) (12/18 1434) Pulse Rate:  [89-97] 97 (12/18 1434) Resp:  [17-18] 18 (12/18 1434) BP: (123-154)/(73-112) 154/112 (12/18 1434) SpO2:  [95 %-97 %] 95 % (12/18 1434) Weight:  [59.2 kg (130 lb 9.6 oz)] 59.2 kg (130 lb 9.6 oz) (12/18 0500)  Weight change:  Filed Weights   12/24/15 0750 12/24/15 1110 12/25/15 0500  Weight: 61.7 kg (136 lb 0.4 oz) 58.3 kg (128 lb 8.5 oz) 59.2 kg (130 lb 9.6 oz)    Intake/Output: I/O last 3 completed shifts: In: 240 [P.O.:240] Out: 1950 [Other:1950]   Intake/Output this shift:  No intake/output data recorded.  Physical Exam: General: chronically ill appearing  ENT Moist oral mucus membranes  Eyes: anicteric  Neck: supple  Lungs:  Bilateral rhonchi, crackles at bases  Heart: Irregular, A Fib  Abdomen:  Soft, nontender, BS present  Extremities: no peripheral edema  Neurologic: Awake, alert, follows commands  Skin: No lesions  Access: Fem temp HD catheter 12/10 Dr. Stevenson Cook    Basic Metabolic Panel:  Recent Labs Lab 12/18/15 1801  12/19/15 4562 12/20/15 0439 12/21/15 0607 12/22/15 0603 12/25/15 0520  NA  --   --  134* 137 139 140 136  K  --   --  3.5 5.4* 3.0* 4.2 4.1  CL  --   --  98* 98* 102 106 97*  CO2  --   --  '27 29 29 25 28  ' GLUCOSE  --   --  89 104* 97 93 94  BUN  --   --  37* 21* 17 26* 27*  CREATININE  --   --  5.60* 3.46* 3.41* 5.51* 4.79*  CALCIUM  --   < > 8.7* 9.1 9.2 9.3 10.0  MG 2.1  --   --   --   --  2.1  --   PHOS 3.5  --   --   --   --  3.1  --   < > = values in this interval not displayed.  Liver Function Tests: No results for input(s): AST, ALT, ALKPHOS, BILITOT, PROT, ALBUMIN in the last 168 hours. No results for input(s): LIPASE, AMYLASE in  the last 168 hours. No results for input(s): AMMONIA in the last 168 hours.  CBC:  Recent Labs Lab 12/20/15 0439 12/21/15 0607 12/22/15 0603 12/24/15 0507 12/25/15 0520  WBC 5.2 3.8 4.8 7.2 9.2  NEUTROABS 3.6  --   --   --   --   HGB 7.4* 7.2* 7.1* 8.3* 8.7*  HCT 22.2* 21.6* 21.5* 25.1* 26.0*  MCV 83.0 83.7 85.0 83.7 83.9  PLT 143* 143* 148* 187 199    Cardiac Enzymes: No results for input(s): CKTOTAL, CKMB, CKMBINDEX, TROPONINI in the last 168 hours.  BNP: Invalid input(s): POCBNP  CBG:  Recent Labs Lab 12/19/15 2357 12/20/15 0408 12/20/15 1958 12/21/15 0118 12/21/15 0439  GLUCAP 118* 91 114* 92 105*    Microbiology: Results for orders placed or performed during the hospital encounter of 12/15/15  Blood Culture (routine x 2)     Status: None   Collection Time: 12/15/15  7:52 PM  Result Value Ref Range Status   Specimen Description BLOOD LEFT ASSIST CONTROL  Final   Special  Requests   Final    BOTTLES DRAWN AEROBIC AND ANAEROBIC 12CCAERO,14CCANA   Culture NO GROWTH 5 DAYS  Final   Report Status 12/20/2015 FINAL  Final  Blood Culture (routine x 2)     Status: None   Collection Time: 12/15/15  7:53 PM  Result Value Ref Range Status   Specimen Description BLOOD L HAND  Final   Special Requests   Final    BOTTLES DRAWN AEROBIC AND ANAEROBIC AER 11CC, ANA 10CC   Culture NO GROWTH 5 DAYS  Final   Report Status 12/20/2015 FINAL  Final  Urine culture     Status: None   Collection Time: 12/15/15  8:42 PM  Result Value Ref Range Status   Specimen Description URINE, RANDOM  Final   Special Requests NONE  Final   Culture NO GROWTH Performed at [facility]   Final   Report Status 12/17/2015 FINAL  Final  Culture, sputum-assessment     Status: None   Collection Time: 12/15/15 11:31 PM  Result Value Ref Range Status   Specimen Description EXPECTORATED SPUTUM  Final   Special Requests Immunocompromised  Final   Sputum evaluation   Final    Sputum  specimen not acceptable for testing.  Please recollect.   C/Teresa Cook AT 4818 12/16/15.PMH   Report Status 12/16/2015 FINAL  Final  MRSA PCR Screening     Status: None   Collection Time: 12/15/15 11:31 PM  Result Value Ref Range Status   MRSA by PCR NEGATIVE NEGATIVE Final    Comment:        The GeneXpert MRSA Assay (FDA approved for NASAL specimens only), is one component of a comprehensive MRSA colonization surveillance program. It is not intended to diagnose MRSA infection nor to guide or monitor treatment for MRSA infections.   Cath Tip Culture     Status: None   Collection Time: 12/16/15  3:30 PM  Result Value Ref Range Status   Specimen Description HEMODIALYSIS CATHETER  Final   Special Requests NONE  Final   Culture   Final    NO GROWTH 3 DAYS Performed at [facility]    Report Status 12/20/2015 FINAL  Final  MRSA PCR Screening     Status: None   Collection Time: 12/17/15 11:32 AM  Result Value Ref Range Status   MRSA by PCR NEGATIVE NEGATIVE Final    Comment:        The GeneXpert MRSA Assay (FDA approved for NASAL specimens only), is one component of a comprehensive MRSA colonization surveillance program. It is not intended to diagnose MRSA infection nor to guide or monitor treatment for MRSA infections.   Culture, blood (single) w Reflex to ID Panel     Status: None   Collection Time: 12/17/15  5:30 PM  Result Value Ref Range Status   Specimen Description PORTA CATH  Final   Special Requests BOTTLES DRAWN AEROBIC AND ANAEROBIC 5ML  Final   Culture NO GROWTH 5 DAYS  Final   Report Status 12/22/2015 FINAL  Final    Coagulation Studies:  Recent Labs  12/23/15 0530 12/24/15 0507 12/25/15 0520  LABPROT 25.1* 23.0* 21.3*  INR 2.23 2.00 1.82    Urinalysis: No results for input(s): COLORURINE, LABSPEC, PHURINE, GLUCOSEU, HGBUR, BILIRUBINUR, KETONESUR, PROTEINUR, UROBILINOGEN, NITRITE, LEUKOCYTESUR in the last 72 hours.  Invalid input(s):  APPERANCEUR    Imaging: Dg Chest 2 View  Result Date: 12/24/2015 CLINICAL DATA:  Generalized weakness.  Breast cancer.  CHF,  asthma EXAM: CHEST  2 VIEW COMPARISON:  12/21/2015 FINDINGS: Heart is upper limits normal in size. Calcifications in the aortic arch. Lungs are clear. No effusions or edema. No acute bony abnormality. IMPRESSION: No active cardiopulmonary disease. Electronically Signed   By: Rolm Baptise M.D.   On: 12/24/2015 11:48     Medications:    . aspirin EC  81 mg Oral Daily  . clonazePAM  1 mg Oral QHS  . feeding supplement (ENSURE ENLIVE)  237 mL Oral TID BM  . guaiFENesin  1,200 mg Oral BID  . lidocaine  20 mL Intradermal Once  . losartan  100 mg Oral Once per day on Sun Tue Thu Sat  . mouth rinse  15 mL Mouth Rinse BID  . metoprolol  50 mg Oral 2 times per day on Sun Tue Thu Sat  . midazolam      . midazolam  4 mg Intravenous Once  . pantoprazole  40 mg Oral BID AC  . sertraline  50 mg Oral QHS  . sodium chloride flush  10-40 mL Intracatheter Q12H  . vancomycin  750 mg Intravenous Q M,W,F-HD  . Warfarin - Pharmacist Dosing Inpatient   Does not apply q1800   acetaminophen **OR** acetaminophen, ALPRAZolam, benzonatate, bisacodyl, diphenhydrAMINE, heparin lock flush, heparin lock flush, HYDROcodone-acetaminophen, ipratropium-albuterol, metoprolol, phenol, senna-docusate, sodium chloride flush, sodium chloride flush, zolpidem  Assessment/ Plan:  [name] is a 60 y.o. 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 English.  1. ESRD on HD MWF: with acute pulmonary edema  Patient due for PermCath today.  Thereafter we will plan for hemodialysis.  2. Bacteremia/sepsis: 12/9 gram positive bacilli  from [place].- Corynebacterium Permcath removed. Blood cultures and cath tip cultures are no growth  chemotherapy infusion port removed this admission - PermCath to be placed today.  3. Anemia of chronic kidney disease: hemoglobin 8.7 - Avoid Epogen given recent diagnosis of breast cancer. - blood transfusion given this admission - continue to monitor CBC.  4. Secondary hyperparathyroidism. -  hold cinacalcet and sevelamer for now - last phosphorus was 3.1 and acceptable.     LOS: [place] 12/18/20172:49 PM

## 2015-12-25 NOTE — Progress Notes (Addendum)
Pharmacy Antibiotic Note  Teresa Cook is a 60 y.o. female admitted on 12/15/2015 with sepsis, being treated for line infection. Pharmacy has been consulted for vancomycin and cefepime dosing.  Plan: 12/15 Vanc level Pre-HD: 23 (Goal: 15-25 mcg/mL). Patient received hemodialysis on Sunday, 12/17 and had Vancomycin infused. Will continue with  Vancomycin 750mg  to be given at the conclusion of each dialysis session. Will obtain next trough on 12/20. Vancomycin stop date 12/23.   Height: 5\' 6"  (167.6 cm) Weight: 130 lb 9.6 oz (59.2 kg) IBW/kg (Calculated) : 59.3  Temp (24hrs), Avg:98.3 F (36.8 C), Min:97.9 F (36.6 C), Max:98.7 F (37.1 C)   Recent Labs Lab 12/19/15 0438 12/20/15 0439 12/21/15 0607 12/22/15 0603 12/24/15 0507 12/25/15 0520  WBC 6.3 5.2 3.8 4.8 7.2 9.2  CREATININE 5.60* 3.46* 3.41* 5.51*  --  4.79*  VANCORANDOM  --   --   --  23  --   --     Estimated Creatinine Clearance: 11.5 mL/min (by C-G formula based on SCr of 4.79 mg/dL (H)).    Allergies  Allergen Reactions  . Adhesive [Tape] Itching    Silk tape is ok to use.    Antimicrobials this admission: Cefepime 12/8 >> 12/14 Vancomycin 12/8 >>   Dose adjustments this admission: N/A  Microbiology results: 12/10 BCx: no growth < 24 hours 12/9 Cath Tip Cx: no growth x 2 days  12/8 BCx: no growth x 3  12/8 Urine: no growth  12/8 Sputum: specifmen not acceptable for testing  12/8 MRSA PCR: negative  12/7 BCx Outpt: corynebacterium   Pharmacy will continue to monitor and adjust per consult.   Loree Fee, PharmD 12/25/2015 8:59 AM  Larene Beach, PharmD

## 2015-12-25 NOTE — Progress Notes (Signed)
Chaplain was making his rounds and visited with pt in room 124. Provided the ministry of prayer.    12/25/15 1415  Clinical Encounter Type  Visited With Patient  Visit Type Initial;Spiritual support  Referral From Nurse  Spiritual Encounters  Spiritual Needs Prayer

## 2015-12-25 NOTE — Consult Note (Signed)
[facility] Consult Note  MRN : [id number]  Teresa Cook is a 60 y.o. (1955/08/14) female who presents with chief complaint of  Chief Complaint  Patient presents with  . Weakness  .  History of Present Illness: I am asked by Dr. Candiss Norse from nephrology to see the patient regarding PermCath placement. She was here and had to have an infected Port-A-Cath removed from her left chest last week. Her cultures have cleared and she is no longer bacteremic. She had a previous PermCath on the right side for many years. Complicating the situation is her breast cancer. She has a previous left breast cancer. She now appears to have a right breast cancer. Gen. surgery is following and said that she should not have a left jugular PermCath.  Current Facility-Administered Medications  Medication Dose Route Frequency Provider Last Rate Last Dose  . acetaminophen (TYLENOL) tablet 650 mg  650 mg Oral Q6H PRN Alexis Hugelmeyer, DO   650 mg at 12/16/15 1340   Or  . acetaminophen (TYLENOL) suppository 650 mg  650 mg Rectal Q6H PRN Alexis Hugelmeyer, DO      . ALPRAZolam Duanne Moron) tablet 0.5 mg  0.5 mg Oral BID PRN [name], MD   0.5 mg at 12/24/15 1729  . aspirin EC tablet 81 mg  81 mg Oral Daily Vishal Mungal, MD   81 mg at 12/24/15 1202  . benzonatate (TESSALON) capsule 100 mg  100 mg Oral TID PRN Fritzi Mandes, MD   100 mg at 12/24/15 1207  . bisacodyl (DULCOLAX) EC tablet 5 mg  5 mg Oral Daily PRN Alexis Hugelmeyer, DO      . clonazePAM (KLONOPIN) tablet 1 mg  1 mg Oral QHS Murlean Iba, MD   1 mg at 12/24/15 2148  . diphenhydrAMINE (BENADRYL) capsule 25 mg  25 mg Oral Q8H PRN Fritzi Mandes, MD   25 mg at 12/18/15 2127  . feeding supplement (ENSURE ENLIVE) (ENSURE ENLIVE) liquid 237 mL  237 mL Oral TID BM [name], MD   237 mL at 12/23/15 1448  . guaiFENesin (MUCINEX) 12 hr tablet 1,200 mg  1,200 mg Oral BID Murlean Iba, MD   1,200 mg at 12/24/15 2147  . heparin lock flush  100 unit/mL  500 Units Intracatheter Daily PRN [name], MD      . heparin lock flush 100 unit/mL  250 Units Intracatheter PRN [name], MD      . HYDROcodone-acetaminophen (NORCO/VICODIN) 5-325 MG per tablet 1 tablet  1 tablet Oral Q4H PRN Epifanio Lesches, MD   1 tablet at 12/22/15 1644  . ipratropium-albuterol (DUONEB) 0.5-2.5 (3) MG/3ML nebulizer solution 3 mL  3 mL Nebulization Q6H PRN Alexis Hugelmeyer, DO   3 mL at 12/21/15 2223  . lidocaine (XYLOCAINE) 1 % (with pres) injection 20 mL  20 mL Intradermal Once Lavonia Dana, MD      . losartan (COZAAR) tablet 100 mg  100 mg Oral Once per day on Sun Tue Thu Sat [facility], DO   100 mg at 12/24/15 1201  . MEDLINE mouth rinse  15 mL Mouth Rinse BID Vishal Mungal, MD   15 mL at 12/25/15 1022  . metoprolol (LOPRESSOR) injection 10 mg  10 mg Intravenous Q4H PRN Vishal Mungal, MD   10 mg at 12/18/15 1101  . metoprolol (LOPRESSOR) tablet 50 mg  50 mg Oral 2 times per day on Sun Tue Thu Sat Alexis Hugelmeyer, DO   50 mg at  12/24/15 2147  . midazolam (VERSED) 2 MG/ML syrup           . midazolam (VERSED) injection 4 mg  4 mg Intravenous Once Algernon Huxley, MD      . pantoprazole (PROTONIX) EC tablet 40 mg  40 mg Oral BID AC Vishal Mungal, MD   40 mg at 12/24/15 1729  . phenol (CHLORASEPTIC) mouth spray 1 spray  1 spray Mouth/Throat PRN Alexis Hugelmeyer, DO   1 spray at 12/20/15 2054  . senna-docusate (Senokot-S) tablet 1 tablet  1 tablet Oral QHS PRN Alexis Hugelmeyer, DO   1 tablet at 12/20/15 1144  . sertraline (ZOLOFT) tablet 50 mg  50 mg Oral QHS Vishal Mungal, MD   50 mg at 12/24/15 2148  . sodium chloride flush (NS) 0.9 % injection 10 mL  10 mL Intracatheter PRN [name], MD      . sodium chloride flush (NS) 0.9 % injection 10-40 mL  10-40 mL Intracatheter Q12H Mikael Spray, NP   30 mL at 12/25/15 1021  . sodium chloride flush (NS) 0.9 % injection 10-40 mL  10-40 mL Intracatheter PRN Mikael Spray,  NP   10 mL at 12/20/15 1156  . vancomycin (VANCOCIN) IVPB 750 mg/150 ml premix  750 mg Intravenous Q M,W,F-HD Merilyn Baba, RPH   Stopped at 12/25/15 1311  . Warfarin - Pharmacist Dosing Inpatient   Does not apply q1800 Pernell Dupre, RPH   Stopped at 12/24/15 1953  . zolpidem (AMBIEN) tablet 5 mg  5 mg Oral QHS PRN Lance Coon, MD   5 mg at 12/24/15 2148    Past Medical History:  Diagnosis Date  . Anemia   . Anxiety   . Breast cancer ([place])   . CHF (congestive heart failure) ([place])   . Chronic kidney disease   . Depression   . Dialysis patient ([employer])   . DVT (deep venous thrombosis) ([facility])    left leg  . DVT (deep venous thrombosis) ([place])   . Gout   . HTN (hypertension)   . Hypertension   . Parathyroid abnormality ([place])   . Parathyroid disease ([place])   . Psoriasis   . Renal insufficiency   . Sickle cell anemia ([facility])    traits    Past Surgical History:  Procedure Laterality Date  . ABDOMINAL HYSTERECTOMY    . APPENDECTOMY    . BREAST BIOPSY Left 10/28/2013   benign  . BREAST EXCISIONAL BIOPSY Left 2002   benign  . INSERTION OF DIALYSIS CATHETER  2014  . PARTIAL HYSTERECTOMY    . PORT-A-CATH REMOVAL N/A 12/20/2015   Procedure: REMOVAL PORT-A-CATH;  Surgeon: Hubbard Robinson, MD;  Location: [facility] ORS;  Service: General;  Laterality: N/A;  left    . PORTACATH PLACEMENT Left 08/21/2015   Procedure: INSERTION PORT-A-CATH;  Surgeon: Hubbard Robinson, MD;  Location: [facility] ORS;  Service: General;  Laterality: Left;    Social History Social History  Substance Use Topics  . Smoking status: Never Smoker  . Smokeless tobacco: Never Used  . Alcohol use No  No IV drug use  Family History Family History  Problem Relation Age of Onset  . Stroke Mother   . CVA Mother   . Hypertension Mother   . Hypertension Father   . Hypertension Sister   . Diabetes Sister   . Cancer Sister 11    Breast  . Stroke Brother   . Hypertension Brother   . Breast cancer  Maternal Aunt 70     Allergies  Allergen Reactions  . Adhesive [Tape] Itching    Silk tape is ok to use.     REVIEW OF SYSTEMS (Negative unless checked)  Constitutional: '[]'$ Weight loss  '[x]'$ Fever  '[]'$ Chills Cardiac: '[]'$ Chest pain   '[]'$ Chest pressure   '[]'$ Palpitations   '[]'$ Shortness of breath when laying flat   '[]'$ Shortness of breath at rest   '[]'$ Shortness of breath with exertion. Vascular:  '[]'$ Pain in legs with walking   '[]'$ Pain in legs at rest   '[]'$ Pain in legs when laying flat   '[]'$ Claudication   '[]'$ Pain in feet when walking  '[]'$ Pain in feet at rest  '[]'$ Pain in feet when laying flat   '[]'$ History of DVT   '[]'$ Phlebitis   '[]'$ Swelling in legs   '[]'$ Varicose veins   '[]'$ Non-healing ulcers Pulmonary:   '[]'$ Uses home oxygen   '[]'$ Productive cough   '[]'$ Hemoptysis   '[]'$ Wheeze  '[]'$ COPD   '[]'$ Asthma Neurologic:  '[]'$ Dizziness  '[]'$ Blackouts   '[]'$ Seizures   '[]'$ History of stroke   '[]'$ History of TIA  '[]'$ Aphasia   '[]'$ Temporary blindness   '[]'$ Dysphagia   '[]'$ Weakness or numbness in arms   '[]'$ Weakness or numbness in legs Musculoskeletal:  '[]'$ Arthritis   '[]'$ Joint swelling   '[]'$ Joint pain   '[]'$ Low back pain Hematologic:  '[]'$ Easy bruising  '[]'$ Easy bleeding   '[]'$ Hypercoagulable state   '[x]'$ Anemic  '[]'$ Hepatitis Gastrointestinal:  '[]'$ Blood in stool   '[]'$ Vomiting blood  '[]'$ Gastroesophageal reflux/heartburn   '[]'$ Difficulty swallowing. Genitourinary:  '[x]'$ Chronic kidney disease   '[]'$ Difficult urination  '[]'$ Frequent urination  '[]'$ Burning with urination   '[]'$ Blood in urine Skin:  '[]'$ Rashes   '[]'$ Ulcers   '[]'$ Wounds Psychological:  '[]'$ History of anxiety   '[]'$  History of major depression.  Physical Examination  Vitals:   12/25/15 0448 12/25/15 0500 12/25/15 1406 12/25/15 1434  BP: 126/73  123/82 (!) 154/112  Pulse: 89  93 97  Resp: '17  18 18  '$ Temp: 97.9 F (36.6 C)  98.6 F (37 C) 98.7 F (37.1 C)  TempSrc: Oral  Oral   SpO2: 97%  95% 95%  Weight:  59.2 kg (130 lb 9.6 oz)    Height:       Body mass index is 21.08 kg/m. Gen:  WD/WN, NAD Head: [place]/AT, No temporalis wasting. Prominent temp  pulse not noted. Ear/Nose/Throat: Hearing grossly intact, nares w/o erythema or drainage, oropharynx w/o Erythema/Exudate Eyes: Sclera non-icteric, conjunctiva clear Neck: Trachea midline.  No JVD.  Pulmonary:  Good air movement, respirations not labored, equal bilaterally.  Cardiac: RRR, normal S1, S2. Vascular: port removal incision healing well.  Previous scars from permcath on right chest Vessel Right Left  Radial Palpable Palpable                                   Gastrointestinal: soft, non-tender/non-distended. No guarding/reflex.  Musculoskeletal: M/S 5/5 throughout.  Extremities without ischemic changes.  No deformity or atrophy. No edema. Neurologic: Sensation grossly intact in extremities.  Symmetrical.  Speech is fluent. Motor exam as listed above. Psychiatric: Judgment intact, Mood & affect appropriate for pt's clinical situation. Dermatologic: No rashes or ulcers noted.  No cellulitis or open wounds. Lymph : No Cervical, Axillary, or Inguinal lymphadenopathy.      CBC Lab Results  Component Value Date   WBC 9.2 12/25/2015   HGB 8.7 (L) 12/25/2015   HCT 26.0 (L) 12/25/2015   MCV 83.9 12/25/2015   PLT 199 12/25/2015  BMET    Component Value Date/Time   NA 136 12/25/2015 0520   NA 138 02/03/2014 0135   K 4.1 12/25/2015 0520   K 4.0 02/03/2014 0135   CL 97 (L) 12/25/2015 0520   CL 102 02/03/2014 0135   CO2 28 12/25/2015 0520   CO2 23 02/03/2014 0135   GLUCOSE 94 12/25/2015 0520   GLUCOSE 101 (H) 02/03/2014 0135   BUN 27 (H) 12/25/2015 0520   BUN 21 (H) 02/03/2014 0135   CREATININE 4.79 (H) 12/25/2015 0520   CREATININE 5.11 (H) 02/03/2014 0135   CALCIUM 10.0 12/25/2015 0520   CALCIUM 8.6 02/03/2014 0135   GFRNONAA 9 (L) 12/25/2015 0520   GFRNONAA 9 (L) 02/03/2014 0135   GFRNONAA 6 (L) 09/08/2013 0133   GFRAA 10 (L) 12/25/2015 0520   GFRAA 11 (L) 02/03/2014 0135   GFRAA 7 (L) 09/08/2013 0133   Estimated Creatinine Clearance: 11.5 mL/min (by  C-G formula based on SCr of 4.79 mg/dL (H)).  COAG Lab Results  Component Value Date   INR 1.82 12/25/2015   INR 2.00 12/24/2015   INR 2.23 12/23/2015    Radiology Dg Chest 1 View  Result Date: 12/17/2015 CLINICAL DATA:  Respiratory failure, intubation EXAM: CHEST 1 VIEW COMPARISON:  Portable exam 1058 hours compared to 12/16/2015 FINDINGS: Tip of endotracheal tube projects 4.3 cm above carina. Nasogastric tube extends into stomach. RIGHT subclavian Port-A-Cath with tip projecting over SVC. Borderline enlargement of cardiac silhouette. Atherosclerotic calcification aorta. Patchy BILATERAL pulmonary infiltrates with perihilar and upper lobe predominance, question multifocal pneumonia. No pleural effusion or pneumothorax. Bones demineralized. IMPRESSION: New BILATERAL perihilar and upper lobe infiltrates greatest in RIGHT upper lobe question multifocal pneumonia, asymmetric edema less likely. Electronically Signed   By: Lavonia Dana M.D.   On: 12/17/2015 11:33   Dg Chest 1 View  Result Date: 12/16/2015 CLINICAL DATA:  Initial evaluation for possible pneumonia. EXAM: CHEST 1 VIEW COMPARISON:  Prior radiograph from 12/15/2015. FINDINGS: Right-sided pool blooming in hemodialysis catheter in place, stable. Left-sided Port-A-Cath also unchanged. Moderate cardiomegaly. Mediastinal silhouette within normal limits. Prominent atheromatous plaque within the aortic arch. Lungs normally inflated. Mild diffuse vascular congestion without overt pulmonary edema. No new focal infiltrates identified to suggest pneumonia. No pleural effusion. No pneumothorax. Osseous structures are unchanged. IMPRESSION: 1. No new focal infiltrates to suggest pneumonia identified. 2. Stable cardiomegaly with mild diffuse pulmonary vascular congestion without overt pulmonary edema. Electronically Signed   By: Jeannine Boga M.D.   On: 12/16/2015 04:51   Dg Chest 2 View  Result Date: 12/24/2015 CLINICAL DATA:  Generalized  weakness.  Breast cancer.  CHF, asthma EXAM: CHEST  2 VIEW COMPARISON:  12/21/2015 FINDINGS: Heart is upper limits normal in size. Calcifications in the aortic arch. Lungs are clear. No effusions or edema. No acute bony abnormality. IMPRESSION: No active cardiopulmonary disease. Electronically Signed   By: Rolm Baptise M.D.   On: 12/24/2015 11:48   Dg Chest 2 View  Result Date: 12/21/2015 CLINICAL DATA:  60 year old female with recent respiratory failure and sepsis. Extubated. Porta cath removal on 12/20/2015. Initial encounter. EXAM: CHEST  2 VIEW COMPARISON:  12/18/2015 and earlier. FINDINGS: Seated AP and lateral views of the chest. Left Port-A-Cath has been removed. Extubated enteric tube removed. Stable lung volumes. Decreased right perihilar opacity but interval increased more peripheral right upper lobe opacity which appears to border the minor fissure (arrows). No definite pleural effusion. Stable cardiac size and mediastinal contours. Tortuous aorta. Calcified aortic atherosclerosis. No pneumothorax  identified. Pulmonary vascular congestion without overt edema. Mild posterior lower lobe patchy opacity on the lateral view, not correlated on the frontal. Kyphoscoliosis. No acute osseous abnormality identified. IMPRESSION: 1. Left chest porta cath removed. Extubated and enteric tube removed. 2. Improved right perihilar ventilation but right lateral upper lobe pneumonia with increased consolidations since 12/18/2015. No definite pleural effusion. 3. Probable superimposed posterior lower lobe atelectasis. 4.  Calcified aortic atherosclerosis. Electronically Signed   By: Genevie Ann M.D.   On: 12/21/2015 12:06   Dg Abd 1 View  Result Date: 12/17/2015 CLINICAL DATA:  Orogastric tube placement EXAM: ABDOMEN - 1 VIEW COMPARISON:  Portable exam 1102 hours without priors for comparison FINDINGS: Orogastric tube extends into mid stomach. Linear subsegmental atelectasis medial LEFT lower lobe. RIGHT lung base  clear. Visualized bowel gas pattern normal. IMPRESSION: Tip of orogastric tube projects over mid stomach. Electronically Signed   By: Lavonia Dana M.D.   On: 12/17/2015 11:34   Portable Chest Xray  Result Date: 12/18/2015 CLINICAL DATA:  Respiratory failure, sepsis, chronic CHF EXAM: PORTABLE CHEST 1 VIEW COMPARISON:  Chest x-ray of December 17, 2015 FINDINGS: The lungs are well-expanded. There is persistent infiltrate in the right upper lobe which has decreased slightly. Infiltrate in the left CT perihilar region has also improved. The cardiac silhouette is top-normal in size. The pulmonary vascularity is not clearly engorged. There is calcification in the wall of the aortic arch. The endotracheal tube tip lies 3.7 cm above the carina. The esophagogastric tube tip projects below the inferior margin of the image. The porta catheter tip projects at the cavoatrial junction. There is chronic abnormality of the posterior aspect of the right eighth rib. IMPRESSION: Slight interval improvement in the alveolar opacities bilaterally. Pneumonia is favored though asymmetric pulmonary edema could present similarly. The support tubes are in reasonable position. Thoracic aortic atherosclerosis. Electronically Signed   By: David  Martinique M.D.   On: 12/18/2015 07:17   Dg Chest Port 1 View  Result Date: 12/15/2015 CLINICAL DATA:  Cough and right lower rales.  Fever. EXAM: PORTABLE CHEST 1 VIEW COMPARISON:  09/22/2015 FINDINGS: Left subclavian porta catheter with tip at the upper cavoatrial junction. Right-sided dialysis catheter with tips at the SVC. There is no edema, consolidation, effusion, or pneumothorax. Borderline cardiomegaly. Stable aortic contours. IMPRESSION: No evidence of acute disease. Electronically Signed   By: Monte Fantasia M.D.   On: 12/15/2015 20:16   Dg Abd Portable 1v  Result Date: 12/19/2015 CLINICAL DATA:  Abdominal pain, dialysis patient EXAM: PORTABLE ABDOMEN - 1 VIEW COMPARISON:  Chest and  abdomen film of 12/17/2015 FINDINGS: The dialysis catheter tip from a right groin approach extends to the L4 level. Left femoral central venous line tip overlies the left SI joint. The bowel gas pattern is nonspecific. The liver appears slightly prominent. IMPRESSION: 1. Right groin central venous line tip extends to L4. 2. Left groin central venous line tip extends to the left SI joint. 3. No bowel obstruction. Electronically Signed   By: Ivar Drape M.D.   On: 12/19/2015 11:24   Mm Clip Placement Right  Result Date: 12/06/2015 CLINICAL DATA:  Status post MR guided core biopsy of the right breast. EXAM: DIAGNOSTIC RIGHT MAMMOGRAM POST MRI BIOPSY COMPARISON:  Previous exam(s). FINDINGS: Mammographic images were obtained following MR guided biopsy of the right breast. Mammographic images show there is a dumbbell-shaped clip in the 6 o'clock region of the right breast. IMPRESSION: Status post MR guided core biopsy of the right  breast with pathology pending. Final Assessment: Post Procedure Mammograms for Marker Placement Electronically Signed   By: Lillia Mountain M.D.   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 This was found to be concordant by Dr. Everlean Alstrom. Pathology results were discussed with the patient by telephone. The patient reported doing well after the biopsy with tenderness at the site. Post biopsy instructions and care were reviewed and questions were answered. The patient was encouraged to call [facility] for any additional concerns. The patient has a recent diagnosis of left breast cancer and should follow her outlined treatment plan. Pathology results reported by Terie Purser, RN on 12/08/2015.  Electronically Signed   By: Lillia Mountain M.D.   On: 12/08/2015 14:18   Result Date: 12/11/2015 CLINICAL DATA:  Biopsy proven left breast cancer. Recent MRI showed abnormal enhancement in the right breast. EXAM: MRI GUIDED CORE NEEDLE BIOPSY OF THE RIGHT BREAST TECHNIQUE: Multiplanar, multisequence MR imaging of the right breast was performed both before and after administration of intravenous contrast. CONTRAST:  85m MULTIHANCE GADOBENATE DIMEGLUMINE 529 MG/ML IV SOLN COMPARISON:  Previous exams. FINDINGS: I met with the patient, and we discussed the procedure of MRI guided biopsy, including risks, benefits, and alternatives. Specifically, we discussed the risks of infection, bleeding, tissue injury, clip migration, and inadequate sampling. Informed, written consent was given. The usual time out protocol was performed immediately prior to the procedure. Using sterile technique, 1% Lidocaine, MRI guidance, and a 9 gauge vacuum assisted device, biopsy was performed of the right breast using a lateral to medial approach. At the conclusion of the procedure, a dumbbell-shaped tissue marker clip was deployed into the biopsy cavity. Follow-up 2-view mammogram was performed and dictated separately. IMPRESSION: MRI guided biopsy of the right breast. No apparent complications. Electronically Signed: By: DLillia MountainM.D. On: 12/06/2015 09:47      Assessment/Plan 1. Renal failure. Needs a new PermCath. Cannot place the PermCath in the left jugular vein as her left-sided port was just removed by Gen. surgery. The right jugular vein will be evaluated and if it is patent we can place the catheter there. It is not, she will need a femoral PermCath. 2. Breast cancer. Clearly complicate the situation. Nonetheless, she will need an access placed at this time. 3. Recent bacteremia from infected Port-A-Cath. Port-A-Cath has now been removed.   JLeotis Pain MD  12/25/2015 3:47 PM    This note was created with Dragon  medical transcription system.  Any error is purely unintentional

## 2015-12-25 NOTE — Progress Notes (Addendum)
[facility] NAME: Angila Wombles    MR#:  [id number]  DATE OF BIRTH:  [birth year]  SUBJECTIVE: Feels better,. on 2 L of oxygen, saturating 97%. Patient denies any shortness of breath. Hemodialysis yesterday feeling weak and tired otherwise no complaints   CHIEF COMPLAINT:   Chief Complaint  Patient presents with  . Weakness  Patient is Seen and examined prior to te pemacath plceent. Anxious.  Agreeable with hemodialsis after  New pemacath placement  REVIEW OF SYSTEMS:   ROS CONSTITUTIONAL: No fever, fatigue or weakness.  EYES: No blurred or double vision.  EARS, NOSE, AND THROAT: No tinnitus or ear pain.  RESPIRATORY:Less shortnessof breath today. CARDIOVASCULAR: No chest pain, orthopnea, edema.  GASTROINTESTINAL: No nausea, vomiting, diarrhea mild abdominal pain, Reporting constipation.  GENITOURINARY: Hemodialysis.  ENDOCRINE: No polyuria, nocturia,  HEMATOLOGY: No anemia, easy bruising or bleeding SKIN: No rash or lesion. MUSCULOSKELETAL: No joint pain or arthritis.   NEUROLOGIC: No tingling, numbness, weakness.  PSYCHIATRY: No anxiety or depression.   DRUG ALLERGIES:   Allergies  Allergen Reactions  . Adhesive [Tape] Itching    Silk tape is ok to use.    VITALS:  Blood pressure (!) 143/82, pulse 88, temperature 98.7 F (37.1 C), resp. rate 18, height 5\' 6"  (1.676 m), weight 59.2 kg (130 lb 9.6 oz), SpO2 93 %.  PHYSICAL EXAMINATION:  GENERAL:  60 y.o.-year-old patient lying in the bed with no acute distress.  EYES: Pupils equal, round, reactive to light and accommodation. No scleral icterus. Extraocular muscles intact.  HEENT: Head atraumatic, normocephalic. Oropharynx and nasopharynx clear.  NECK:  Supple, no jugular venous distention. No thyroid enlargement, no tenderness.  LUNGS:Moderate breath sounds, clear to auscultation, no wheezing, no rales.  CARDIOVASCULAR: S1, S2 normal. No murmurs, rubs, or  gallops.  ABDOMEN: Soft, nontender, nondistended. Bowel sounds present. No organomegaly or mass.  EXTREMITIES: No pedal edema, cyanosis, or clubbing.  NEUROLOGIC: Cranial nerves II through XII are intact. Muscle strength 5/5 in all extremities. Sensation intact. Gait not checked.  PSYCHIATRIC: The patient is alert and oriented x 3.  SKIN: No obvious rash, lesion, or ulcer.    LABORATORY PANEL:   CBC  Recent Labs Lab 12/25/15 0520  WBC 9.2  HGB 8.7*  HCT 26.0*  PLT 199   ------------------------------------------------------------------------------------------------------------------  Chemistries   Recent Labs Lab 12/22/15 0603 12/25/15 0520  NA 140 136  K 4.2 4.1  CL 106 97*  CO2 25 28  GLUCOSE 93 94  BUN 26* 27*  CREATININE 5.51* 4.79*  CALCIUM 9.3 10.0  MG 2.1  --    ------------------------------------------------------------------------------------------------------------------  Cardiac Enzymes No results for input(s): TROPONINI in the last 168 hours. ------------------------------------------------------------------------------------------------------------------  RADIOLOGY:  Dg Chest 2 View  Result Date: 12/24/2015 CLINICAL DATA:  Generalized weakness.  Breast cancer.  CHF, asthma EXAM: CHEST  2 VIEW COMPARISON:  12/21/2015 FINDINGS: Heart is upper limits normal in size. Calcifications in the aortic arch. Lungs are clear. No effusions or edema. No acute bony abnormality. IMPRESSION: No active cardiopulmonary disease. Electronically Signed   By: Rolm Baptise M.D.   On: 12/24/2015 11:48    EKG:   Orders placed or performed during the hospital encounter of 12/15/15  . ED EKG  . ED EKG  . EKG 12-Lead  . EKG 12-Lead  . EKG 12-Lead  . EKG 12-Lead    ASSESSMENT AND PLAN:   #.Acute Respiratory failure secondary to combination of fluid overload, multifocal  pneumonia. Status post extubation . Clinically improving continue oxygen 2 L via nasal cannula,  weaned off to roomair Patient is transferred to floor from the [facility] 12/20/2015  #.  sepsis: Secondary to bacteremia  Blood cultures from 12 /9 from HD center diphtheroids. Continue vancomycin until 12/30/2015. Discontinued cefepime on 12/21/2015. procalcitonin level 89.  Cultures from 12/10 no growth, his permacath removal, per catheter tip cultures from December 9 didn't show any growth. -Patient is afebrile, WBC count is normal and cultures with no growth planning for new permacath placement tomorrow. Dr. Heath Lark has recommended new permacath placement on the right side of the chest only  patient  schedued for permacah plaement oday  #.Anemia of chronic kidney disease. Hemoglobin dropped yesterday 12/20/2015, received one unit transfusion. hemoglobin 7.2 -7.1 -8.3    #.history of breast cancer finished chemotherapy. Port is removed for sepsis  # essential hypertension-blood pressure is stable  #history of left leg DVT: Because of anemia stopped  Warfarin.  Transmitted 1 unit of packed RBC, hemoglobin  at 7.1  Coumadin INR at 2.23,d/c heparin drip No active bleeding Resume Coumadin after permacath placement  # end-stage renal disease continue hemodialysis on Monday Wednesday Friday Patient has temporary right femoral dialysis catheter at this time Planning for new permacath placement  The right side of the chest today  And  dialysi aferthat D/W dr.Lateef  # hypertensive urgency secondary to flash pulmonary edema: Improved with hemodialysis. 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 Physical therapy consult.   All the records are reviewed and case discussed with Care Management/Social Workerr. Management plans discussed with the patient, family and they  are in agreement.  CODE STATUS: full  TOTAL TIME TAKING CARE OF THIS PATIENT:32 min  POSSIBLE D/C IN am  DAYS, DEPENDING ON CLINICAL CONDITION.   [name] M.D on 12/25/2015 at 5:16 PM  Between 7am to 6pm - Pager - [phone]  After 6pm go to [email or web address] - password [email or web address] [facility]  Office  [phone]  CC: Primary care physician; Murlean Iba, MD   Note: This dictation was prepared with Dragon dictation along with smaller phrase technology. Any transcriptional errors that result from this process are unintentional.

## 2015-12-25 NOTE — Progress Notes (Addendum)
HD discontinued. PT became tachycardic, 150s sustained for approx 1-1.5 minutes. All blood returned to patient. HR now back to baseline low 100s. Pt remained asymptomatic throughout. Dr. Holley Raring notified. Report called to primary RN. 995mL UF out of 1L goal .

## 2015-12-25 NOTE — Progress Notes (Signed)
  Attempted numerous times to see patient today without success due to patient being in procedures.  Per the chart sepsis appears to have resolved with antibiotics and removal central access.  Patient will need to follow-up with Dr.Loflin after discharge to continue planning for her care of her breast cancer.  Please call the general surgery team again if any further assistance is needed during this hospital stay.  Clayburn Pert, MD [facility]

## 2015-12-25 NOTE — Progress Notes (Signed)
Post HD assessment  

## 2015-12-25 NOTE — Progress Notes (Signed)
Pre HD assessment  

## 2015-12-25 NOTE — Op Note (Signed)
OPERATIVE NOTE    PRE-OPERATIVE DIAGNOSIS: 1. ESRD 2. Removal of permcath and port for infection previously  POST-OPERATIVE DIAGNOSIS: same as above  PROCEDURE: 1. Fluoroscopic guidance for placement of catheter 2. Placement of a 44 cm tip to cuff tunneled hemodialysis catheter via the right femoral vein  SURGEON: Leotis Pain, MD  ANESTHESIA:  Local with moderate conscious sedation for approximately 24 minutes using 3 mg of Versed and 125 mcg of Fentanyl  ESTIMATED BLOOD LOSS: 15 cc  FINDING(S): 1.  none  SPECIMEN(S):  None  INDICATIONS:   Patient is a 60 y.o.female who presents with renal failure and recent removal of both a left sided port and a right sided Permcath.   The patient needs long term dialysis access for their ESRD, and a Permcath is necessary.  Risks and benefits are discussed and informed consent is obtained.    DESCRIPTION: After obtaining full informed written consent, the patient was brought back to the vascular suited. Moderate conscious sedation was administered throughout the procedure with a face-to-face encounter with my presence for the entire procedure and with my supervision of the RN monitoring the patient's vital signs, pulse oximetry, telemetry, and mental status throughout the procedure. The patient's right groin and existing temp cath were sterilely prepped and draped and a sterile surgical field was created.  I used the old temp cath as access and placed a J wire through the catheter and removed the catheter. A wire was placed. After skin nick and dilatation, the peel-away sheath was placed over the wire. I then turned my attention to an area about 5 cm inferior and lateral to the access incision and a small counterincision was created.  I tunneled from the counter  incision to the access site. Using fluoroscopic guidance, a 89 centimeterer tip to cuff tunneled hemodialysis catheter was selected, and tunneled from the counter  incision to the access site.  It was then placed through the peel-away sheath and the peel-away sheath was removed. Using fluoroscopic guidance the catheter tips were parked in the retrohepatic vena cava. The appropriate distal connectors were placed. It withdrew blood well and flushed easily with heparinized saline and a concentrated heparin solution was then placed. It was secured to the leg  with 2 Prolene sutures. The access incision was closed single 4-0 Monocryl. A 4-0 Monocryl pursestring suture was placed around the exit site. Sterile dressings were placed. The patient tolerated the procedure well and was taken to the recovery room in stable condition.  COMPLICATIONS: None  CONDITION: Stable    Leotis Pain 12/25/2015 4:35 PM   This note was created with Dragon Medical transcription system. Any errors in dictation are purely unintentional.

## 2015-12-25 NOTE — Progress Notes (Signed)
HD initiated without issue. Pt currently has no complaints. Given warm blankets, continue to monitor

## 2015-12-25 NOTE — Progress Notes (Signed)
Teresa Cook  [phone] [phone]  ID: Teresa Cook OB: 18-Jun-1955  MR#: [id number]  VOH#:[id number]  [name], [name], MD (Family Medicine)  CHIEF COMPLAINT: Pneumonia, sepsis.  INTERVAL HISTORY: Patient still significantly weak and fatigued, but otherwise improving. No further complaints today.  REVIEW OF SYSTEMS:   Review of Systems  Constitutional: Positive for malaise/fatigue. Negative for fever and weight loss.  Respiratory: Positive for cough and shortness of breath.   Cardiovascular: Negative.  Negative for chest pain and leg swelling.  Gastrointestinal: Positive for abdominal pain.  Neurological: Negative.   Psychiatric/Behavioral: Negative.  The patient is not nervous/anxious.     As per HPI. Otherwise, a complete review of systems is negative.  PAST MEDICAL HISTORY: Past Medical History:  Diagnosis Date  . Anemia   . Anxiety   . Breast cancer ([place])   . CHF (congestive heart failure) ([place])   . Chronic kidney disease   . Depression   . Dialysis patient ([place])   . DVT (deep venous thrombosis) ([facility])    left leg  . DVT (deep venous thrombosis) ([place])   . Gout   . HTN (hypertension)   . Hypertension   . Parathyroid abnormality ([place])   . Parathyroid disease ([place])   . Psoriasis   . Renal insufficiency   . Sickle cell anemia ([facility])    traits    PAST SURGICAL HISTORY: Past Surgical History:  Procedure Laterality Date  . ABDOMINAL HYSTERECTOMY    . APPENDECTOMY    . BREAST BIOPSY Left 10/28/2013   benign  . BREAST EXCISIONAL BIOPSY Left 2002   benign  . INSERTION OF DIALYSIS CATHETER  2014  . PARTIAL HYSTERECTOMY    . PORT-A-CATH REMOVAL N/A 12/20/2015   Procedure: REMOVAL PORT-A-CATH;  Surgeon: Hubbard Robinson, MD;  Location: [facility] ORS;  Service: General;  Laterality: N/A;  left    . PORTACATH PLACEMENT Left 08/21/2015   Procedure:  INSERTION PORT-A-CATH;  Surgeon: Hubbard Robinson, MD;  Location: [facility] ORS;  Service: General;  Laterality: Left;    FAMILY HISTORY: Family History  Problem Relation Age of Onset  . Stroke Mother   . CVA Mother   . Hypertension Mother   . Hypertension Father   . Hypertension Sister   . Diabetes Sister   . Cancer Sister 81    Breast  . Stroke Brother   . Hypertension Brother   . Breast cancer Maternal Aunt 45    ADVANCED DIRECTIVES (Y/N):  '@ADVDIR' @  HEALTH MAINTENANCE: Social History  Substance Use Topics  . Smoking status: Never Smoker  . Smokeless tobacco: Never Used  . Alcohol use No     Colonoscopy:  PAP:  Bone density:  Lipid panel:  Allergies  Allergen Reactions  . Adhesive [Tape] Itching    Silk tape is ok to use.    Current Facility-Administered Medications  Medication Dose Route Frequency Provider Last Rate Last Dose  . acetaminophen (TYLENOL) tablet 650 mg  650 mg Oral Q6H PRN Alexis Hugelmeyer, DO   650 mg at 12/16/15 1340   Or  . acetaminophen (TYLENOL) suppository 650 mg  650 mg Rectal Q6H PRN Alexis Hugelmeyer, DO      . ALPRAZolam Duanne Moron) tablet 0.5 mg  0.5 mg Oral BID PRN [name], MD   0.5 mg at 12/24/15 1729  . aspirin EC tablet 81 mg  81 mg Oral Daily Vilinda Boehringer, MD  81 mg at 12/24/15 1202  . benzonatate (TESSALON) capsule 100 mg  100 mg Oral TID PRN Fritzi Mandes, MD   100 mg at 12/24/15 1207  . bisacodyl (DULCOLAX) EC tablet 5 mg  5 mg Oral Daily PRN Alexis Hugelmeyer, DO      . clonazePAM (KLONOPIN) tablet 1 mg  1 mg Oral QHS Murlean Iba, MD   1 mg at 12/24/15 2148  . diphenhydrAMINE (BENADRYL) capsule 25 mg  25 mg Oral Q8H PRN Fritzi Mandes, MD   25 mg at 12/18/15 2127  . feeding supplement (ENSURE ENLIVE) (ENSURE ENLIVE) liquid 237 mL  237 mL Oral TID BM [name], MD   237 mL at 12/23/15 1448  . guaiFENesin (MUCINEX) 12 hr tablet 1,200 mg  1,200 mg Oral BID Murlean Iba, MD   1,200 mg at 12/24/15 2147  . heparin lock flush 100  unit/mL  500 Units Intracatheter Daily PRN [name], MD      . heparin lock flush 100 unit/mL  250 Units Intracatheter PRN [name], MD      . HYDROcodone-acetaminophen (NORCO/VICODIN) 5-325 MG per tablet 1 tablet  1 tablet Oral Q4H PRN Epifanio Lesches, MD   1 tablet at 12/22/15 1644  . ipratropium-albuterol (DUONEB) 0.5-2.5 (3) MG/3ML nebulizer solution 3 mL  3 mL Nebulization Q6H PRN Alexis Hugelmeyer, DO   3 mL at 12/21/15 2223  . lidocaine (XYLOCAINE) 1 % (with pres) injection 20 mL  20 mL Intradermal Once Lavonia Dana, MD      . losartan (COZAAR) tablet 100 mg  100 mg Oral Once per day on Sun Tue Thu Sat [facility], DO   100 mg at 12/24/15 1201  . MEDLINE mouth rinse  15 mL Mouth Rinse BID Vishal Mungal, MD   15 mL at 12/25/15 1022  . metoprolol (LOPRESSOR) injection 10 mg  10 mg Intravenous Q4H PRN Vishal Mungal, MD   10 mg at 12/18/15 1101  . metoprolol (LOPRESSOR) tablet 50 mg  50 mg Oral 2 times per day on Sun Tue Thu Sat [facility], DO   50 mg at 12/24/15 2147  . midazolam (VERSED) 2 MG/ML syrup           . midazolam (VERSED) injection 4 mg  4 mg Intravenous Once Algernon Huxley, MD      . pantoprazole (PROTONIX) EC tablet 40 mg  40 mg Oral BID AC Vishal Mungal, MD   40 mg at 12/24/15 1729  . phenol (CHLORASEPTIC) mouth spray 1 spray  1 spray Mouth/Throat PRN Alexis Hugelmeyer, DO   1 spray at 12/20/15 2054  . senna-docusate (Senokot-S) tablet 1 tablet  1 tablet Oral QHS PRN Alexis Hugelmeyer, DO   1 tablet at 12/20/15 1144  . sertraline (ZOLOFT) tablet 50 mg  50 mg Oral QHS Vishal Mungal, MD   50 mg at 12/24/15 2148  . sodium chloride flush (NS) 0.9 % injection 10 mL  10 mL Intracatheter PRN [name], MD      . sodium chloride flush (NS) 0.9 % injection 10-40 mL  10-40 mL Intracatheter Q12H Mikael Spray, NP   30 mL at 12/25/15 1021  . sodium chloride flush (NS) 0.9 % injection 10-40 mL  10-40 mL Intracatheter PRN Mikael Spray, NP    10 mL at 12/20/15 1156  . vancomycin (VANCOCIN) IVPB 750 mg/150 ml premix  750 mg Intravenous Q M,W,F-HD Merilyn Baba, RPH   Stopped at 12/25/15 1311  .  Warfarin - Pharmacist Dosing Inpatient   Does not apply q1800 Pernell Dupre, RPH   Stopped at 12/24/15 1953  . zolpidem (AMBIEN) tablet 5 mg  5 mg Oral QHS PRN Lance Coon, MD   5 mg at 12/24/15 2148    OBJECTIVE: Vitals:   12/25/15 0448 12/25/15 1406  BP: 126/73 123/82  Pulse: 89 93  Resp: 17 18  Temp: 97.9 F (36.6 C) 98.6 F (37 C)     Body mass index is 21.08 kg/m.    ECOG FS:3 - Symptomatic, >50% confined to bed  General: Ill-appearing. Eyes: Pink conjunctiva, anicteric sclera. Lungs: Clear to auscultation bilaterally. Heart: Regular rate and rhythm. No rubs, murmurs, or gallops. Abdomen: Soft, nontender, nondistended. No organomegaly noted, normoactive bowel sounds. Musculoskeletal: No edema, cyanosis, or clubbing. Neuro: Alert, answering all questions appropriately. Cranial nerves grossly intact. Skin: No rashes or petechiae noted. Psych: Normal affect.  LAB RESULTS:  Lab Results  Component Value Date   NA 136 12/25/2015   K 4.1 12/25/2015   CL 97 (L) 12/25/2015   CO2 28 12/25/2015   GLUCOSE 94 12/25/2015   BUN 27 (H) 12/25/2015   CREATININE 4.79 (H) 12/25/2015   CALCIUM 10.0 12/25/2015   PROT 5.3 (L) 12/18/2015   ALBUMIN 2.6 (L) 12/18/2015   AST 23 12/18/2015   ALT 14 12/18/2015   ALKPHOS 171 (H) 12/18/2015   BILITOT 0.7 12/18/2015   GFRNONAA 9 (L) 12/25/2015   GFRAA 10 (L) 12/25/2015    Lab Results  Component Value Date   WBC 9.2 12/25/2015   NEUTROABS 3.6 12/20/2015   HGB 8.7 (L) 12/25/2015   HCT 26.0 (L) 12/25/2015   MCV 83.9 12/25/2015   PLT 199 12/25/2015     STUDIES: Dg Chest 1 View  Result Date: 12/17/2015 CLINICAL DATA:  Respiratory failure, intubation EXAM: CHEST 1 VIEW COMPARISON:  Portable exam 1058 hours compared to 12/16/2015 FINDINGS: Tip of endotracheal tube projects 4.3  cm above carina. Nasogastric tube extends into stomach. RIGHT subclavian Port-A-Cath with tip projecting over SVC. Borderline enlargement of cardiac silhouette. Atherosclerotic calcification aorta. Patchy BILATERAL pulmonary infiltrates with perihilar and upper lobe predominance, question multifocal pneumonia. No pleural effusion or pneumothorax. Bones demineralized. IMPRESSION: New BILATERAL perihilar and upper lobe infiltrates greatest in RIGHT upper lobe question multifocal pneumonia, asymmetric edema less likely. Electronically Signed   By: Lavonia Dana M.D.   On: 12/17/2015 11:33   Dg Chest 1 View  Result Date: 12/16/2015 CLINICAL DATA:  Initial evaluation for possible pneumonia. EXAM: CHEST 1 VIEW COMPARISON:  Prior radiograph from 12/15/2015. FINDINGS: Right-sided pool blooming in hemodialysis catheter in place, stable. Left-sided Port-A-Cath also unchanged. Moderate cardiomegaly. Mediastinal silhouette within normal limits. Prominent atheromatous plaque within the aortic arch. Lungs normally inflated. Mild diffuse vascular congestion without overt pulmonary edema. No new focal infiltrates identified to suggest pneumonia. No pleural effusion. No pneumothorax. Osseous structures are unchanged. IMPRESSION: 1. No new focal infiltrates to suggest pneumonia identified. 2. Stable cardiomegaly with mild diffuse pulmonary vascular congestion without overt pulmonary edema. Electronically Signed   By: Jeannine Boga M.D.   On: 12/16/2015 04:51   Dg Chest 2 View  Result Date: 12/24/2015 CLINICAL DATA:  Generalized weakness.  Breast cancer.  CHF, asthma EXAM: CHEST  2 VIEW COMPARISON:  12/21/2015 FINDINGS: Heart is upper limits normal in size. Calcifications in the aortic arch. Lungs are clear. No effusions or edema. No acute bony abnormality. IMPRESSION: No active cardiopulmonary disease. Electronically Signed   By: Rolm Baptise  M.D.   On: 12/24/2015 11:48   Dg Chest 2 View  Result Date:  12/21/2015 CLINICAL DATA:  60 year old female with recent respiratory failure and sepsis. Extubated. Porta cath removal on 12/20/2015. Initial encounter. EXAM: CHEST  2 VIEW COMPARISON:  12/18/2015 and earlier. FINDINGS: Seated AP and lateral views of the chest. Left Port-A-Cath has been removed. Extubated enteric tube removed. Stable lung volumes. Decreased right perihilar opacity but interval increased more peripheral right upper lobe opacity which appears to border the minor fissure (arrows). No definite pleural effusion. Stable cardiac size and mediastinal contours. Tortuous aorta. Calcified aortic atherosclerosis. No pneumothorax identified. Pulmonary vascular congestion without overt edema. Mild posterior lower lobe patchy opacity on the lateral view, not correlated on the frontal. Kyphoscoliosis. No acute osseous abnormality identified. IMPRESSION: 1. Left chest porta cath removed. Extubated and enteric tube removed. 2. Improved right perihilar ventilation but right lateral upper lobe pneumonia with increased consolidations since 12/18/2015. No definite pleural effusion. 3. Probable superimposed posterior lower lobe atelectasis. 4.  Calcified aortic atherosclerosis. Electronically Signed   By: Genevie Ann M.D.   On: 12/21/2015 12:06   Dg Abd 1 View  Result Date: 12/17/2015 CLINICAL DATA:  Orogastric tube placement EXAM: ABDOMEN - 1 VIEW COMPARISON:  Portable exam 1102 hours without priors for comparison FINDINGS: Orogastric tube extends into mid stomach. Linear subsegmental atelectasis medial LEFT lower lobe. RIGHT lung base clear. Visualized bowel gas pattern normal. IMPRESSION: Tip of orogastric tube projects over mid stomach. Electronically Signed   By: Lavonia Dana M.D.   On: 12/17/2015 11:34   Portable Chest Xray  Result Date: 12/18/2015 CLINICAL DATA:  Respiratory failure, sepsis, chronic CHF EXAM: PORTABLE CHEST 1 VIEW COMPARISON:  Chest x-ray of December 17, 2015 FINDINGS: The lungs are  well-expanded. There is persistent infiltrate in the right upper lobe which has decreased slightly. Infiltrate in the left CT perihilar region has also improved. The cardiac silhouette is top-normal in size. The pulmonary vascularity is not clearly engorged. There is calcification in the wall of the aortic arch. The endotracheal tube tip lies 3.7 cm above the carina. The esophagogastric tube tip projects below the inferior margin of the image. The porta catheter tip projects at the cavoatrial junction. There is chronic abnormality of the posterior aspect of the right eighth rib. IMPRESSION: Slight interval improvement in the alveolar opacities bilaterally. Pneumonia is favored though asymmetric pulmonary edema could present similarly. The support tubes are in reasonable position. Thoracic aortic atherosclerosis. Electronically Signed   By: David  Martinique M.D.   On: 12/18/2015 07:17   Dg Chest Port 1 View  Result Date: 12/15/2015 CLINICAL DATA:  Cough and right lower rales.  Fever. EXAM: PORTABLE CHEST 1 VIEW COMPARISON:  09/22/2015 FINDINGS: Left subclavian porta catheter with tip at the upper cavoatrial junction. Right-sided dialysis catheter with tips at the SVC. There is no edema, consolidation, effusion, or pneumothorax. Borderline cardiomegaly. Stable aortic contours. IMPRESSION: No evidence of acute disease. Electronically Signed   By: Monte Fantasia M.D.   On: 12/15/2015 20:16   Dg Abd Portable 1v  Result Date: 12/19/2015 CLINICAL DATA:  Abdominal pain, dialysis patient EXAM: PORTABLE ABDOMEN - 1 VIEW COMPARISON:  Chest and abdomen film of 12/17/2015 FINDINGS: The dialysis catheter tip from a right groin approach extends to the L4 level. Left femoral central venous line tip overlies the left SI joint. The bowel gas pattern is nonspecific. The liver appears slightly prominent. IMPRESSION: 1. Right groin central venous line tip extends to L4. 2.  Left groin central venous line tip extends to the left SI  joint. 3. No bowel obstruction. Electronically Signed   By: Ivar Drape M.D.   On: 12/19/2015 11:24   Mm Clip Placement Right  Result Date: 12/06/2015 CLINICAL DATA:  Status post MR guided core biopsy of the right breast. EXAM: DIAGNOSTIC RIGHT MAMMOGRAM POST MRI BIOPSY COMPARISON:  Previous exam(s). FINDINGS: Mammographic images were obtained following MR guided biopsy of the right breast. Mammographic images show there is a dumbbell-shaped clip in the 6 o'clock region of the right breast. IMPRESSION: Status post MR guided core biopsy of the right breast with pathology pending. Final Assessment: Post Procedure Mammograms for Marker Placement Electronically Signed   By: Lillia Mountain M.D.   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 This was found to be concordant by Dr. Everlean Alstrom. Pathology results were discussed with the patient by telephone. The patient reported doing well after the biopsy with tenderness at the site. Post biopsy instructions and care were reviewed and questions were answered. The patient was encouraged to call [facility] for any additional concerns. The patient has a recent diagnosis of left breast cancer and should follow her outlined treatment plan. Pathology results reported by Terie Purser, RN on 12/08/2015. Electronically Signed   By: Lillia Mountain M.D.   On: 12/08/2015 14:18   Result Date: 12/11/2015 CLINICAL DATA:  Biopsy proven left breast cancer. Recent MRI showed abnormal enhancement in the right breast. EXAM: MRI GUIDED CORE NEEDLE BIOPSY OF THE RIGHT BREAST TECHNIQUE: Multiplanar, multisequence MR imaging of the right breast was performed both before and after administration of  intravenous contrast. CONTRAST:  17m MULTIHANCE GADOBENATE DIMEGLUMINE 529 MG/ML IV SOLN COMPARISON:  Previous exams. FINDINGS: I met with the patient, and we discussed the procedure of MRI guided biopsy, including risks, benefits, and alternatives. Specifically, we discussed the risks of infection, bleeding, tissue injury, clip migration, and inadequate sampling. Informed, written consent was given. The usual time out protocol was performed immediately prior to the procedure. Using sterile technique, 1% Lidocaine, MRI guidance, and a 9 gauge vacuum assisted device, biopsy was performed of the right breast using a lateral to medial approach. At the conclusion of the procedure, a dumbbell-shaped tissue marker clip was deployed into the biopsy cavity. Follow-up 2-view mammogram was performed and dictated separately. IMPRESSION: MRI guided biopsy of the right breast. No apparent complications. Electronically Signed: By: DLillia MountainM.D. On: 12/06/2015 09:47    ASSESSMENT:   PLAN:    1. 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 She was recently seen surgery and elected to postpone surgical intervention until after Christmas. She plans to have bilateral mastectomy. Patient had her port removed 4-5 days ago secondary to infection. No need to replace port at this time. Patient has been instructed to keep her follow-up appointment in the [facility] after her bilateral mastectomy.  2. End-stage renal disease: Continue dialysis on Monday, Wednesday, and Fridays. 3. Anemia: Chronic. Okay to transfuse if hemoglobin falls below 7.0  Appreciate consult, call with questions.  TLloyd Huger MD  12/25/2015 2:25 PM

## 2015-12-26 ENCOUNTER — Telehealth: Payer: Self-pay

## 2015-12-26 ENCOUNTER — Encounter: Payer: Self-pay | Admitting: Vascular Surgery

## 2015-12-26 LAB — BASIC METABOLIC PANEL
ANION GAP: 10 (ref 5–15)
BUN: 23 mg/dL — ABNORMAL HIGH (ref 6–20)
CALCIUM: 10.3 mg/dL (ref 8.9–10.3)
CHLORIDE: 97 mmol/L — AB (ref 101–111)
CO2: 28 mmol/L (ref 22–32)
Creatinine, Ser: 4.37 mg/dL — ABNORMAL HIGH (ref 0.44–1.00)
GFR calc non Af Amer: 10 mL/min — ABNORMAL LOW (ref >60)
GFR, EST AFRICAN AMERICAN: 12 mL/min — AB (ref >60)
Glucose, Bld: 89 mg/dL (ref 65–99)
POTASSIUM: 4.2 mmol/L (ref 3.5–5.1)
Sodium: 135 mmol/L (ref 135–145)

## 2015-12-26 LAB — CBC
HEMATOCRIT: 27.4 % — AB (ref 35.0–47.0)
HEMOGLOBIN: 9.2 g/dL — AB (ref 12.0–16.0)
MCH: 28.4 pg (ref 26.0–34.0)
MCHC: 33.5 g/dL (ref 32.0–36.0)
MCV: 84.8 fL (ref 80.0–100.0)
Platelets: 206 10*3/uL (ref 150–440)
RBC: 3.23 MIL/uL — AB (ref 3.80–5.20)
RDW: 17.6 % — ABNORMAL HIGH (ref 11.5–14.5)
WBC: 10 10*3/uL (ref 3.6–11.0)

## 2015-12-26 LAB — TROPONIN I
TROPONIN I: 0.17 ng/mL — AB (ref <0.03)
Troponin I: 0.18 ng/mL (ref <0.03)

## 2015-12-26 LAB — PROTIME-INR
INR: 1.61
Prothrombin Time: 19.3 seconds — ABNORMAL HIGH (ref 11.4–15.2)

## 2015-12-26 MED ORDER — HEPARIN (PORCINE) IN NACL 100-0.45 UNIT/ML-% IJ SOLN
1050.0000 [IU]/h | INTRAMUSCULAR | Status: DC
  Administered 2015-12-26 – 2015-12-27 (×2): 1050 [IU]/h via INTRAVENOUS
  Filled 2015-12-26 (×4): qty 250

## 2015-12-26 MED ORDER — HEPARIN BOLUS VIA INFUSION
3700.0000 [IU] | Freq: Once | INTRAVENOUS | Status: AC
  Administered 2015-12-26: 16:00:00 3700 [IU] via INTRAVENOUS
  Filled 2015-12-26: qty 3700

## 2015-12-26 MED ORDER — ALPRAZOLAM 0.5 MG PO TABS
0.5000 mg | ORAL_TABLET | Freq: Three times a day (TID) | ORAL | Status: DC | PRN
  Administered 2015-12-27: 11:00:00 0.5 mg via ORAL
  Filled 2015-12-26: qty 1

## 2015-12-26 MED ORDER — WARFARIN SODIUM 5 MG PO TABS
10.0000 mg | ORAL_TABLET | Freq: Once | ORAL | Status: AC
  Administered 2015-12-26: 18:00:00 10 mg via ORAL
  Filled 2015-12-26: qty 2

## 2015-12-26 NOTE — Progress Notes (Signed)
Pharmacy Antibiotic Note  Teresa Cook is a 60 y.o. female admitted on 12/15/2015 with sepsis, being treated for line infection. Pharmacy has been consulted for vancomycin and cefepime dosing.  Plan: 12/15 Vanc level Pre-HD: 23 (Goal: 15-25 mcg/mL). Patient received hemodialysis on Sunday, 12/17 and had Vancomycin infused. Will continue with  Vancomycin 750mg  to be given at the conclusion of each dialysis session. Will obtain next trough on 12/20. Vancomycin stop date 12/23.   Height: 5\' 6"  (167.6 cm) Weight: 129 lb 11.2 oz (58.8 kg) IBW/kg (Calculated) : 59.3  Temp (24hrs), Avg:98.1 F (36.7 C), Min:97.6 F (36.4 C), Max:98.7 F (37.1 C)   Recent Labs Lab 12/20/15 0439 12/21/15 0607 12/22/15 0603 12/24/15 0507 12/25/15 0520 12/26/15 0501  WBC 5.2 3.8 4.8 7.2 9.2 10.0  CREATININE 3.46* 3.41* 5.51*  --  4.79* 4.37*  VANCORANDOM  --   --  23  --   --   --     Estimated Creatinine Clearance: 12.5 mL/min (by C-G formula based on SCr of 4.37 mg/dL (H)).    Allergies  Allergen Reactions  . Adhesive [Tape] Itching    Silk tape is ok to use.    Antimicrobials this admission: Cefepime 12/8 >> 12/14 Vancomycin 12/8 >>   Dose adjustments this admission: N/A  Microbiology results: 12/10 BCx: no growth < 24 hours 12/9 Cath Tip Cx: no growth x 2 days  12/8 BCx: no growth x 3  12/8 Urine: no growth  12/8 Sputum: specifmen not acceptable for testing  12/8 MRSA PCR: negative  12/7 BCx Outpt: corynebacterium   Pharmacy will continue to monitor and adjust per consult.   Loree Fee, PharmD 12/26/2015 9:09 AM

## 2015-12-26 NOTE — Care Management (Signed)
HD info faxed to [facility] liaison.

## 2015-12-26 NOTE — Progress Notes (Signed)
[facility] [place] NAME: Teresa Cook    MR#:  [id number]  DATE OF BIRTH:  08/29/1955  SUBJECTIVE: Feels better,. on 2 L of oxygen, saturating 97%. Patient denies any shortness of breath. Hemodialysis yesterday feeling weak and tired otherwise no complaints   CHIEF COMPLAINT:   Chief Complaint  Patient presents with  . Weakness  Patient is Seen and examined .Patient is concerned as she her permacath is placed in the right groin. Nauseous denies vomiting. Denies any chest pain or shortness of breath  REVIEW OF SYSTEMS:   ROS CONSTITUTIONAL: No fever, fatigue or weakness.  EYES: No blurred or double vision.  EARS, NOSE, AND THROAT: No tinnitus or ear pain.  RESPIRATORY:Less shortnessof breath today . CARDIOVASCULAR: No chest pain, orthopnea, edema.  GASTROINTESTINAL: No nausea, vomiting, diarrhea mild abdominal pain, Reporting constipation.  GENITOURINARY: Hemodialysis.  ENDOCRINE: No polyuria, nocturia,  HEMATOLOGY: No anemia, easy bruising or bleeding SKIN: No rash or lesion.Right groin area with new permacath placement MUSCULOSKELETAL: No joint pain or arthritis.   NEUROLOGIC: No tingling, numbness, weakness.  PSYCHIATRY: No anxiety or depression.   DRUG ALLERGIES:   Allergies  Allergen Reactions  . Adhesive [Tape] Itching    Silk tape is ok to use.    VITALS:  Blood pressure 103/75, pulse 97, temperature 98.5 F (36.9 C), temperature source Oral, resp. rate 16, height 5\' 6"  (1.676 m), weight 58.8 kg (129 lb 11.2 oz), SpO2 99 %.  PHYSICAL EXAMINATION:  GENERAL:  60 y.o.-year-old patient lying in the bed with no acute distress.  EYES: Pupils equal, round, reactive to light and accommodation. No scleral icterus. Extraocular muscles intact.  HEENT: Head atraumatic, normocephalic. Oropharynx and nasopharynx clear.  NECK:  Supple, no jugular venous distention. No thyroid enlargement, no tenderness.  LUNGS:Moderate  breath sounds, clear to auscultation, no wheezing, no rales.  CARDIOVASCULAR: S1, S2 normal. No murmurs, rubs, or gallops.  ABDOMEN: Soft, nontender, nondistended. Bowel sounds present. No organomegaly or mass.  EXTREMITIES: No pedal edema, cyanosis, or clubbing.  NEUROLOGIC: Cranial nerves II through XII are intact. Muscle strength 5/5 in all extremities. Sensation intact. Gait not checked.  PSYCHIATRIC: The patient is alert and oriented x 3.  SKIN: Right femoral area with new permacath placement, tender and left femoral area with temporary dialysis catheter No obvious rash, lesion, or ulcer.    LABORATORY PANEL:   CBC  Recent Labs Lab 12/26/15 0501  WBC 10.0  HGB 9.2*  HCT 27.4*  PLT 206   ------------------------------------------------------------------------------------------------------------------  Chemistries   Recent Labs Lab 12/22/15 0603  12/26/15 0501  NA 140  < > 135  K 4.2  < > 4.2  CL 106  < > 97*  CO2 25  < > 28  GLUCOSE 93  < > 89  BUN 26*  < > 23*  CREATININE 5.51*  < > 4.37*  CALCIUM 9.3  < > 10.3  MG 2.1  --   --   < > = values in this interval not displayed. ------------------------------------------------------------------------------------------------------------------  Cardiac Enzymes No results for input(s): TROPONINI in the last 168 hours. ------------------------------------------------------------------------------------------------------------------  RADIOLOGY:  No results found.  EKG:   Orders placed or performed during the hospital encounter of 12/15/15  . ED EKG  . ED EKG  . EKG 12-Lead  . EKG 12-Lead  . EKG 12-Lead  . EKG 12-Lead  . ED EKG  . ED EKG    ASSESSMENT AND PLAN:   #.Acute  Respiratory failure secondary to combination of fluid overload, multifocal pneumonia. Status post extubation . Clinically improving continue oxygen 2 L via nasal cannula, weaned off to roomair Patient is transferred to floor from the [facility]  12/20/2015  #.  sepsis: Secondary to bacteremia  Blood cultures from 12 /9 from HD center diphtheroids. Continue vancomycin until 12/30/2015. Discontinued cefepime on 12/21/2015. procalcitonin level 89.  Cultures from 12/10 no growth, his permacath removal, per catheter tip cultures from December 9 didn't show any growth. - Dr. Heath Lark has recommended new permacath placement on the right side of the chest only  patient  had a new permacath placement in the right groin area12/18/17. Patient wants to talk to vascular surgery regarding the procedures Refusing fistula versus graft but wants to talk to vascular surgery for details  # cardiac arrhythmia Cardiology consult and cycle troponins  #.Anemia of chronic kidney disease. Hemoglobin dropped yesterday 12/20/2015, received one unit transfusion. hemoglobin 7.2 -7.1 -8.3 --8.7-9.2   #.history of breast cancer finished chemotherapy. Port is removed for sepsis  # essential hypertension-blood pressure is stable  #history of left leg DVT: Because of anemia stopped  Warfarin.  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 surgery, Nephrology, ID, pulmonary, oncology following the patient. Physical therapy consult.   All the records are reviewed and case discussed with Care Management/Social Workerr. Management plans discussed with the patient, family and they are in agreement.  CODE STATUS: full  TOTAL  TIME TAKING CARE OF THIS PATIENT:32 min  POSSIBLE D/C IN am  DAYS, DEPENDING ON CLINICAL CONDITION.   [name] M.D on 12/26/2015 at 4:19 PM  Between 7am to 6pm - Pager - [phone]  After 6pm go to [email or web address] - password [email or web address] [facility]  Office  [phone]  CC: Primary care physician; Murlean Iba, MD   Note: This dictation was prepared with Dragon dictation along with smaller phrase technology. Any transcriptional errors that result from this process are unintentional.

## 2015-12-26 NOTE — Consult Note (Signed)
[facility] Cardiology  CARDIOLOGY CONSULT NOTE  Patient ID: Teresa Cook MRN: [id number] DOB/AGE: 1955-09-09 60 y.o.  Admit date: 12/15/2015 Referring Physician [name] Primary Physician Candiss Norse Primary Cardiologist  Reason for Consultation Tachycardia  HPI: 60 year old female referred for evaluation of tachycardia. Patient has a history of end-stage renal disease on chronic hemodialysis, congestive heart failure, history of DVT, and essential hypertension. She was admitted with productive cough, fevers, with probable pneumonia and sepsis. Patient was noted to be tachycardic, ECG today reveals sinus tachycardia. The patient reports a history of intermittent upper dictation some tachycardia. She currently denies chest pain. The patient is on metoprolol tartrate 50 mg twice a day.  Review of systems complete and found to be negative unless listed above     Past Medical History:  Diagnosis Date  . Anemia   . Anxiety   . Breast cancer ([place])   . CHF (congestive heart failure) ([place])   . Chronic kidney disease   . Depression   . Dialysis patient ([place])   . DVT (deep venous thrombosis) ([facility])    left leg  . DVT (deep venous thrombosis) ([place])   . Gout   . HTN (hypertension)   . Hypertension   . Parathyroid abnormality ([place])   . Parathyroid disease ([place])   . Psoriasis   . Renal insufficiency   . Sickle cell anemia ([facility])    traits    Past Surgical History:  Procedure Laterality Date  . ABDOMINAL HYSTERECTOMY    . APPENDECTOMY    . BREAST BIOPSY Left 10/28/2013   benign  . BREAST EXCISIONAL BIOPSY Left 2002   benign  . INSERTION OF DIALYSIS CATHETER  2014  . PARTIAL HYSTERECTOMY    . PERIPHERAL VASCULAR CATHETERIZATION N/A 12/25/2015   Procedure: Dialysis/Perma Catheter Insertion;  Surgeon: Algernon Huxley, MD;  Location: [facility];  Service: Cardiovascular;  Laterality: N/A;  . PORT-A-CATH REMOVAL N/A 12/20/2015   Procedure: REMOVAL PORT-A-CATH;  Surgeon: Hubbard Robinson, MD;   Location: [facility] ORS;  Service: General;  Laterality: N/A;  left    . PORTACATH PLACEMENT Left 08/21/2015   Procedure: INSERTION PORT-A-CATH;  Surgeon: Hubbard Robinson, MD;  Location: [facility] ORS;  Service: General;  Laterality: Left;    Prescriptions Prior to Admission  Medication Sig Dispense Refill Last Dose  . amoxicillin-clavulanate (AUGMENTIN) 500-125 MG tablet Take 1 tablet by mouth daily.   12/15/2015 at Unknown time  . benzonatate (TESSALON) 200 MG capsule Take 200 mg by mouth 3 (three) times daily as needed for cough.   12/15/2015 at Unknown time  . clonazePAM (KLONOPIN) 1 MG tablet Take 1 mg by mouth daily. May take 1 and 1/2 tablet if needed.   Past Week at Unknown time  . losartan (COZAAR) 50 MG tablet Take 100 mg by mouth daily. Takes on non-dialysis days=Tuesday, Thursday, Saturday and Sunday.   Past Week at Unknown time  . metoprolol (LOPRESSOR) 100 MG tablet Take 50 mg by mouth 2 (two) times daily. Takes on non-dialysis days=Tuesday, Thursday, Saturday and Sunday.   Past Week at Unknown time  . pantoprazole (PROTONIX) 40 MG tablet Take 1 tablet (40 mg total) by mouth 2 (two) times daily before a meal. 60 tablet 0 12/15/2015 at Unknown time  . sertraline (ZOLOFT) 100 MG tablet Take 100 mg by mouth daily.    Past Week at Unknown time  . traMADol (ULTRAM) 50 MG tablet Take 50 mg by mouth every 6 (six) hours as needed.    prn  at prn  . warfarin (COUMADIN) 5 MG tablet Take 1 tablet Friday, Saturday and Sundays. Take 1 1/2 tablets the other days of the week   Past Week at Unknown time  . zolpidem (AMBIEN) 10 MG tablet Take 10 mg by mouth at bedtime as needed for sleep.   Past Week at Unknown time  . [EXPIRED] amoxicillin-clavulanate (AUGMENTIN) 500-125 MG tablet Take 1 tablet by mouth daily.     . [EXPIRED] benzonatate (TESSALON) 200 MG capsule Take 200 mg by mouth 3 (three) times daily as needed.      Social History   Social History  . Marital status: Married    Spouse name: N/A  .  Number of children: N/A  . Years of education: N/A   Occupational History  . disabled    Social History Main Topics  . Smoking status: Never Smoker  . Smokeless tobacco: Never Used  . Alcohol use No  . Drug use: No  . Sexual activity: Not on file   Other Topics Concern  . Not on file   Social History Narrative   ** Merged History Encounter **        Family History  Problem Relation Age of Onset  . Stroke Mother   . CVA Mother   . Hypertension Mother   . Hypertension Father   . Hypertension Sister   . Diabetes Sister   . Cancer Sister 36    Breast  . Stroke Brother   . Hypertension Brother   . Breast cancer Maternal Aunt 70      Review of systems complete and found to be negative unless listed above      PHYSICAL EXAM  General: Well developed, well nourished, in no acute distress HEENT:  Normocephalic and atramatic Neck:  No JVD.  Lungs: Clear bilaterally to auscultation and percussion. Heart: HRRR . Normal S1 and S2 without gallops or murmurs.  Abdomen: Bowel sounds are positive, abdomen soft and non-tender  Msk:  Back normal, normal gait. Normal strength and tone for age. Extremities: No clubbing, cyanosis or edema.   Neuro: Alert and oriented X 3. Psych:  Good affect, responds appropriately  Labs:   Lab Results  Component Value Date   WBC 10.0 12/26/2015   HGB 9.2 (L) 12/26/2015   HCT 27.4 (L) 12/26/2015   MCV 84.8 12/26/2015   PLT 206 12/26/2015    Recent Labs Lab 12/26/15 0501  NA 135  K 4.2  CL 97*  CO2 28  BUN 23*  CREATININE 4.37*  CALCIUM 10.3  GLUCOSE 89   Lab Results  Component Value Date   CKTOTAL 43 02/03/2014   CKMB 0.9 02/03/2014   TROPONINI 0.15 (HH) 12/18/2015    Lab Results  Component Value Date   CHOL 250 (H) 02/03/2014   CHOL 335 (H) 04/30/2013   Lab Results  Component Value Date   HDL 35 (L) 02/03/2014   HDL 32 (L) 04/30/2013   Lab Results  Component Value Date   LDLCALC 186 (H) 02/03/2014   LDLCALC 258  (H) 04/30/2013   Lab Results  Component Value Date   TRIG 144 02/03/2014   TRIG 225 (H) 04/30/2013   No results found for: CHOLHDL No results found for: LDLDIRECT    Radiology: Dg Chest 1 View  Result Date: 12/17/2015 CLINICAL DATA:  Respiratory failure, intubation EXAM: CHEST 1 VIEW COMPARISON:  Portable exam 1058 hours compared to 12/16/2015 FINDINGS: Tip of endotracheal tube projects 4.3 cm above carina. Nasogastric tube extends  into stomach. RIGHT subclavian Port-A-Cath with tip projecting over SVC. Borderline enlargement of cardiac silhouette. Atherosclerotic calcification aorta. Patchy BILATERAL pulmonary infiltrates with perihilar and upper lobe predominance, question multifocal pneumonia. No pleural effusion or pneumothorax. Bones demineralized. IMPRESSION: New BILATERAL perihilar and upper lobe infiltrates greatest in RIGHT upper lobe question multifocal pneumonia, asymmetric edema less likely. Electronically Signed   By: Lavonia Dana M.D.   On: 12/17/2015 11:33   Dg Chest 1 View  Result Date: 12/16/2015 CLINICAL DATA:  Initial evaluation for possible pneumonia. EXAM: CHEST 1 VIEW COMPARISON:  Prior radiograph from 12/15/2015. FINDINGS: Right-sided pool blooming in hemodialysis catheter in place, stable. Left-sided Port-A-Cath also unchanged. Moderate cardiomegaly. Mediastinal silhouette within normal limits. Prominent atheromatous plaque within the aortic arch. Lungs normally inflated. Mild diffuse vascular congestion without overt pulmonary edema. No new focal infiltrates identified to suggest pneumonia. No pleural effusion. No pneumothorax. Osseous structures are unchanged. IMPRESSION: 1. No new focal infiltrates to suggest pneumonia identified. 2. Stable cardiomegaly with mild diffuse pulmonary vascular congestion without overt pulmonary edema. Electronically Signed   By: Jeannine Boga M.D.   On: 12/16/2015 04:51   Dg Chest 2 View  Result Date: 12/24/2015 CLINICAL DATA:   Generalized weakness.  Breast cancer.  CHF, asthma EXAM: CHEST  2 VIEW COMPARISON:  12/21/2015 FINDINGS: Heart is upper limits normal in size. Calcifications in the aortic arch. Lungs are clear. No effusions or edema. No acute bony abnormality. IMPRESSION: No active cardiopulmonary disease. Electronically Signed   By: Rolm Baptise M.D.   On: 12/24/2015 11:48   Dg Chest 2 View  Result Date: 12/21/2015 CLINICAL DATA:  60 year old female with recent respiratory failure and sepsis. Extubated. Porta cath removal on 12/20/2015. Initial encounter. EXAM: CHEST  2 VIEW COMPARISON:  12/18/2015 and earlier. FINDINGS: Seated AP and lateral views of the chest. Left Port-A-Cath has been removed. Extubated enteric tube removed. Stable lung volumes. Decreased right perihilar opacity but interval increased more peripheral right upper lobe opacity which appears to border the minor fissure (arrows). No definite pleural effusion. Stable cardiac size and mediastinal contours. Tortuous aorta. Calcified aortic atherosclerosis. No pneumothorax identified. Pulmonary vascular congestion without overt edema. Mild posterior lower lobe patchy opacity on the lateral view, not correlated on the frontal. Kyphoscoliosis. No acute osseous abnormality identified. IMPRESSION: 1. Left chest porta cath removed. Extubated and enteric tube removed. 2. Improved right perihilar ventilation but right lateral upper lobe pneumonia with increased consolidations since 12/18/2015. No definite pleural effusion. 3. Probable superimposed posterior lower lobe atelectasis. 4.  Calcified aortic atherosclerosis. Electronically Signed   By: Genevie Ann M.D.   On: 12/21/2015 12:06   Dg Abd 1 View  Result Date: 12/17/2015 CLINICAL DATA:  Orogastric tube placement EXAM: ABDOMEN - 1 VIEW COMPARISON:  Portable exam 1102 hours without priors for comparison FINDINGS: Orogastric tube extends into mid stomach. Linear subsegmental atelectasis medial LEFT lower lobe. RIGHT  lung base clear. Visualized bowel gas pattern normal. IMPRESSION: Tip of orogastric tube projects over mid stomach. Electronically Signed   By: Lavonia Dana M.D.   On: 12/17/2015 11:34   Portable Chest Xray  Result Date: 12/18/2015 CLINICAL DATA:  Respiratory failure, sepsis, chronic CHF EXAM: PORTABLE CHEST 1 VIEW COMPARISON:  Chest x-ray of December 17, 2015 FINDINGS: The lungs are well-expanded. There is persistent infiltrate in the right upper lobe which has decreased slightly. Infiltrate in the left CT perihilar region has also improved. The cardiac silhouette is top-normal in size. The pulmonary vascularity is not clearly engorged.  There is calcification in the wall of the aortic arch. The endotracheal tube tip lies 3.7 cm above the carina. The esophagogastric tube tip projects below the inferior margin of the image. The porta catheter tip projects at the cavoatrial junction. There is chronic abnormality of the posterior aspect of the right eighth rib. IMPRESSION: Slight interval improvement in the alveolar opacities bilaterally. Pneumonia is favored though asymmetric pulmonary edema could present similarly. The support tubes are in reasonable position. Thoracic aortic atherosclerosis. Electronically Signed   By: David  Martinique M.D.   On: 12/18/2015 07:17   Dg Chest Port 1 View  Result Date: 12/15/2015 CLINICAL DATA:  Cough and right lower rales.  Fever. EXAM: PORTABLE CHEST 1 VIEW COMPARISON:  09/22/2015 FINDINGS: Left subclavian porta catheter with tip at the upper cavoatrial junction. Right-sided dialysis catheter with tips at the SVC. There is no edema, consolidation, effusion, or pneumothorax. Borderline cardiomegaly. Stable aortic contours. IMPRESSION: No evidence of acute disease. Electronically Signed   By: Monte Fantasia M.D.   On: 12/15/2015 20:16   Dg Abd Portable 1v  Result Date: 12/19/2015 CLINICAL DATA:  Abdominal pain, dialysis patient EXAM: PORTABLE ABDOMEN - 1 VIEW COMPARISON:   Chest and abdomen film of 12/17/2015 FINDINGS: The dialysis catheter tip from a right groin approach extends to the L4 level. Left femoral central venous line tip overlies the left SI joint. The bowel gas pattern is nonspecific. The liver appears slightly prominent. IMPRESSION: 1. Right groin central venous line tip extends to L4. 2. Left groin central venous line tip extends to the left SI joint. 3. No bowel obstruction. Electronically Signed   By: Ivar Drape M.D.   On: 12/19/2015 11:24   Mm Clip Placement Right  Result Date: 12/06/2015 CLINICAL DATA:  Status post MR guided core biopsy of the right breast. EXAM: DIAGNOSTIC RIGHT MAMMOGRAM POST MRI BIOPSY COMPARISON:  Previous exam(s). FINDINGS: Mammographic images were obtained following MR guided biopsy of the right breast. Mammographic images show there is a dumbbell-shaped clip in the 6 o'clock region of the right breast. IMPRESSION: Status post MR guided core biopsy of the right breast with pathology pending. Final Assessment: Post Procedure Mammograms for Marker Placement Electronically Signed   By: Lillia Mountain M.D.   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 This was found to be concordant by Dr. Everlean Alstrom. Pathology results were discussed with the patient by telephone. The patient reported doing well after the biopsy with tenderness at the site. Post biopsy instructions and care were reviewed and questions were answered. The patient was encouraged to call [facility] for any additional concerns. The patient has a recent diagnosis of left breast cancer and should follow her outlined treatment plan. Pathology results reported by Terie Purser, RN on 12/08/2015.  Electronically Signed   By: Lillia Mountain M.D.   On: 12/08/2015 14:18   Result Date: 12/11/2015 CLINICAL DATA:  Biopsy proven left breast cancer. Recent MRI showed abnormal enhancement in the right breast. EXAM: MRI GUIDED CORE NEEDLE BIOPSY OF THE RIGHT BREAST TECHNIQUE: Multiplanar, multisequence MR imaging of the right breast was performed both before and after administration of intravenous contrast. CONTRAST:  77m MULTIHANCE GADOBENATE DIMEGLUMINE 529 MG/ML IV SOLN COMPARISON:  Previous  exams. FINDINGS: I met with the patient, and we discussed the procedure of MRI guided biopsy, including risks, benefits, and alternatives. Specifically, we discussed the risks of infection, bleeding, tissue injury, clip migration, and inadequate sampling. Informed, written consent was given. The usual time out protocol was performed immediately prior to the procedure. Using sterile technique, 1% Lidocaine, MRI guidance, and a 9 gauge vacuum assisted device, biopsy was performed of the right breast using a lateral to medial approach. At the conclusion of the procedure, a dumbbell-shaped tissue marker clip was deployed into the biopsy cavity. Follow-up 2-view mammogram was performed and dictated separately. IMPRESSION: MRI guided biopsy of the right breast. No apparent complications. Electronically Signed: By: Lillia Mountain M.D. On: 12/06/2015 09:47    EKG: Sinus tachycardia  ASSESSMENT AND PLAN:   1. Sinus tachycardia, compensatory for pneumonia and sepsis, currently on metoprolol 50 mg twice a day 2. Pneumonia/sepsis  Recommendations  1. Continue metoprolol, uptitrate as needed 2. No further cardiac diagnostics at this time  Signed: Isaias Cowman MD,PhD, [facility] 12/26/2015, 5:14 PM

## 2015-12-26 NOTE — Progress Notes (Signed)
[facility]  ROUNDING NOTE   Subjective:  Patient had right femoral PermCath placed yesterday. Patient appears to be rather hesitant to have a fistula or graft placed. She completed hemodialysis late yesterday night.  Objective:  Vital signs in last 24 hours:  Temp:  [97.6 F (36.4 C)-98.7 F (37.1 C)] 98.5 F (36.9 C) (12/19 1211) Pulse Rate:  [85-121] 97 (12/19 1211) Resp:  [13-24] 16 (12/19 1211) BP: (103-154)/(74-117) 103/75 (12/19 1211) SpO2:  [93 %-100 %] 99 % (12/19 1211) Weight:  [58.8 kg (129 lb 11.2 oz)-61.2 kg (135 lb)] 58.8 kg (129 lb 11.2 oz) (12/19 0500)  Weight change: -0.464 kg (-1 lb 0.4 oz) Filed Weights   12/25/15 2036 12/25/15 2234 12/26/15 0500  Weight: 61.2 kg (135 lb) 60.8 kg (134 lb 0.6 oz) 58.8 kg (129 lb 11.2 oz)    Intake/Output: I/O last 3 completed shifts: In: 0  Out: 956 [Other:956]   Intake/Output this shift:  Total I/O In: 120 [P.O.:120] Out: -   Physical Exam: General: chronically ill appearing  ENT Moist oral mucus membranes  Eyes: anicteric  Neck: supple  Lungs:  Bilateral rhonchi, crackles at bases  Heart: Irregular, A Fib  Abdomen:  Soft, nontender, BS present  Extremities: no peripheral edema  Neurologic: Awake, alert, follows commands  Skin: No lesions  Access: Right femoral permcath 86/57/84    Basic Metabolic Panel:  Recent Labs Lab 12/20/15 0439 12/21/15 0607 12/22/15 0603 12/25/15 0520 12/25/15 2030 12/26/15 0501  NA 137 139 140 136  --  135  K 5.4* 3.0* 4.2 4.1  --  4.2  CL 98* 102 106 97*  --  97*  CO2 _0 --  28  GLUCOSE 104* 97 93 94  --  89  BUN 21* 17 26* 27*  --  23*  CREATININE 3.46* 3.41* 5.51* 4.79*  --  4.37*  CALCIUM 9.1 9.2 9.3 10.0  --  10.3  MG  --   --  2.1  --   --   --   PHOS  --   --  3.1  --  4.8*  --     Liver Function Tests: No results for input(s): AST, ALT, ALKPHOS, BILITOT, PROT, ALBUMIN in the last 168 hours. No results for input(s): LIPASE, AMYLASE in  the last 168 hours. No results for input(s): AMMONIA in the last 168 hours.  CBC:  Recent Labs Lab 12/20/15 0439 12/21/15 0607 12/22/15 0603 12/24/15 0507 12/25/15 0520 12/26/15 0501  WBC 5.2 3.8 4.8 7.2 9.2 10.0  NEUTROABS 3.6  --   --   --   --   --   HGB 7.4* 7.2* 7.1* 8.3* 8.7* 9.2*  HCT 22.2* 21.6* 21.5* 25.1* 26.0* 27.4*  MCV 83.0 83.7 85.0 83.7 83.9 84.8  PLT 143* 143* 148* 187 199 206    Cardiac Enzymes: No results for input(s): CKTOTAL, CKMB, CKMBINDEX, TROPONINI in the last 168 hours.  BNP: Invalid input(s): POCBNP  CBG:  Recent Labs Lab 12/19/15 2357 12/20/15 0408 12/20/15 1958 12/21/15 0118 12/21/15 0439  GLUCAP 118* 91 114* 92 105*    Microbiology: Results for orders placed or performed during the hospital encounter of 12/15/15  Blood Culture (routine x 2)     Status: None   Collection Time: 12/15/15  7:52 PM  Result Value Ref Range Status   Specimen Description BLOOD LEFT ASSIST CONTROL  Final   Special Requests   Final    BOTTLES DRAWN AEROBIC AND  ANAEROBIC 12CCAERO,14CCANA   Culture NO GROWTH 5 DAYS  Final   Report Status 12/20/2015 FINAL  Final  Blood Culture (routine x 2)     Status: None   Collection Time: 12/15/15  7:53 PM  Result Value Ref Range Status   Specimen Description BLOOD L HAND  Final   Special Requests   Final    BOTTLES DRAWN AEROBIC AND ANAEROBIC AER 11CC, ANA 10CC   Culture NO GROWTH 5 DAYS  Final   Report Status 12/20/2015 FINAL  Final  Urine culture     Status: None   Collection Time: 12/15/15  8:42 PM  Result Value Ref Range Status   Specimen Description URINE, RANDOM  Final   Special Requests NONE  Final   Culture NO GROWTH Performed at [facility]   Final   Report Status 12/17/2015 FINAL  Final  Culture, sputum-assessment     Status: None   Collection Time: 12/15/15 11:31 PM  Result Value Ref Range Status   Specimen Description EXPECTORATED SPUTUM  Final   Special Requests Immunocompromised  Final    Sputum evaluation   Final    Sputum specimen not acceptable for testing.  Please recollect.   C/Teresa Cook AT 5038 12/16/15.PMH   Report Status 12/16/2015 FINAL  Final  MRSA PCR Screening     Status: None   Collection Time: 12/15/15 11:31 PM  Result Value Ref Range Status   MRSA by PCR NEGATIVE NEGATIVE Final    Comment:        The GeneXpert MRSA Assay (FDA approved for NASAL specimens only), is one component of a comprehensive MRSA colonization surveillance program. It is not intended to diagnose MRSA infection nor to guide or monitor treatment for MRSA infections.   Cath Tip Culture     Status: None   Collection Time: 12/16/15  3:30 PM  Result Value Ref Range Status   Specimen Description HEMODIALYSIS CATHETER  Final   Special Requests NONE  Final   Culture   Final    NO GROWTH 3 DAYS Performed at [facility]    Report Status 12/20/2015 FINAL  Final  MRSA PCR Screening     Status: None   Collection Time: 12/17/15 11:32 AM  Result Value Ref Range Status   MRSA by PCR NEGATIVE NEGATIVE Final    Comment:        The GeneXpert MRSA Assay (FDA approved for NASAL specimens only), is one component of a comprehensive MRSA colonization surveillance program. It is not intended to diagnose MRSA infection nor to guide or monitor treatment for MRSA infections.   Culture, blood (single) w Reflex to ID Panel     Status: None   Collection Time: 12/17/15  5:30 PM  Result Value Ref Range Status   Specimen Description PORTA CATH  Final   Special Requests BOTTLES DRAWN AEROBIC AND ANAEROBIC 5ML  Final   Culture NO GROWTH 5 DAYS  Final   Report Status 12/22/2015 FINAL  Final    Coagulation Studies:  Recent Labs  12/24/15 0507 12/25/15 0520 12/26/15 0501  LABPROT 23.0* 21.3* 19.3*  INR 2.00 1.82 1.61    Urinalysis: No results for input(s): COLORURINE, LABSPEC, PHURINE, GLUCOSEU, HGBUR, BILIRUBINUR, KETONESUR, PROTEINUR, UROBILINOGEN, NITRITE, LEUKOCYTESUR in  the last 72 hours.  Invalid input(s): APPERANCEUR    Imaging: No results found.   Medications:    . aspirin EC  81 mg Oral Daily  . clonazePAM  1 mg Oral QHS  . feeding supplement (  ENSURE ENLIVE)  237 mL Oral TID BM  . guaiFENesin  1,200 mg Oral BID  . lidocaine  20 mL Intradermal Once  . losartan  100 mg Oral Once per day on Sun Tue Thu Sat  . mouth rinse  15 mL Mouth Rinse BID  . metoprolol  50 mg Oral 2 times per day on Sun Tue Thu Sat  . midazolam  4 mg Intravenous Once  . pantoprazole  40 mg Oral BID AC  . sertraline  50 mg Oral QHS  . sodium chloride flush  10-40 mL Intracatheter Q12H  . vancomycin  750 mg Intravenous Q M,W,F-HD  . Warfarin - Pharmacist Dosing Inpatient   Does not apply q1800   acetaminophen **OR** acetaminophen, ALPRAZolam, benzonatate, bisacodyl, diphenhydrAMINE, heparin lock flush, heparin lock flush, HYDROcodone-acetaminophen, ipratropium-albuterol, metoprolol, phenol, senna-docusate, sodium chloride flush, sodium chloride flush, zolpidem  Assessment/ Plan:  [name] is a 60 y.o. 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.  1. ESRD on HD MWF: with acute pulmonary edema  Patient completed hemodialysis yesterday.  No acute indication for dialysis at the moment.  We will plan for hemodialysis again tomorrow.  2. Bacteremia/sepsis: 12/9 gram positive bacilli from [place].- Corynebacterium Permcath removed. Blood cultures and cath tip cultures are no growth  chemotherapy infusion port removed this admission - right femoral PermCath placed.  The patient is rather hesitant to have an AV graft for fistula placed.  3. Anemia of chronic kidney disease: hemoglobin 8.7 -  Avoid Epogen given recent diagnosis of breast cancer. - blood transfusion given this admission - continue to monitor CBC.  4. Secondary hyperparathyroidism. - phosphorus 4.8 at last check and Renvela still remains on hold.     LOS: 11 Kevron Patella 12/19/20172:24 PM

## 2015-12-26 NOTE — Progress Notes (Signed)
ANTICOAGULATION CONSULT NOTE - Follow Up Consult  Pharmacy Consult for warfarin  Indication: hx of VTE   Allergies  Allergen Reactions  . Adhesive [Tape] Itching    Silk tape is ok to use.    Patient Measurements: Height: 5\' 6"  (167.6 cm) Weight: 129 lb 11.2 oz (58.8 kg) IBW/kg (Calculated) : 59.3 Heparin Dosing Weight:   Vital Signs: Temp: 98.5 F (36.9 C) (12/19 1211) Temp Source: Oral (12/19 1211) BP: 103/75 (12/19 1211) Pulse Rate: 97 (12/19 1211)  Labs:  Recent Labs  12/24/15 0507 12/25/15 0520 12/26/15 0501  HGB 8.3* 8.7* 9.2*  HCT 25.1* 26.0* 27.4*  PLT 187 199 206  LABPROT 23.0* 21.3* 19.3*  INR 2.00 1.82 1.61  CREATININE  --  4.79* 4.37*    Estimated Creatinine Clearance: 12.5 mL/min (by C-G formula based on SCr of 4.37 mg/dL (H)).   Assessment:  Pharmacy consulted for heparin drip management for 60 yo female transferred to ICU due to flash pulmonary edema and requiring emergent intubation. Pharmacy previously consulted for warfarin management; patient is currently subtherapeutic. Patient has history significant for DVT x 2 and required warfarin as an outpatient. Patient initiated on heparin drip went transitioned to ICU. Heparin drip was held due to therapeutic INR. Port removal today 12/13. INR now subtherapeutic at 1.61.  Pharmacy consulted for heparin drip dosing and monitoring for bridge to therapeutic INR due to PMH of DVT.   Patients home dose:  M,T,W, Th :7.5mg                                      F, Sa, Sun: 5mg     DATE:          INR:            DOSE  12/13            1.61        7.5mg  12/14          1.33      7.5mg   12/15           1.69             7.5mg        12/16             2.23             5 mg   12/17          2.00       Held  12/18             1.82             Held  12/19            1.61   Goal of Therapy:  Anti-Xa 0.3-0.7  INR: 2-3 Monitor platelets by anticoagulation protocol: Yes   Plan:  Heparin 900units/hr. No bolus.  Will check Anti-Xa in 6 hours.   Will start patient on home dose of warfarin 7.5mg  M,T,W,Th and 5mg  F,Sat,Sun.   INRs ordered daily. Heparin drip to be stopped after INR therapeutic.   12/13:  HL @ 22:45 = 0.21 .   Will order heparin 900 units IV X 1 bolus and increase drip rate to 1100 units/hr.  Will recheck HL 8 hrs after rate change.   12/14 HL: 0.43 . Will continue heparin drip at 1100 units/hr. Will recheck heparin level in 6 hours for confirmation. INR subtherapeutic  at 1.33. Will continue with patients home dose of 7.5mg  today. Expect INR to start trending up in next 48 hours.  12/14 HL: 0.32. This is the second level with in therapeutic range. Will continue heparin drip at 1100 units/hr. HL and CBC ordered with AM labs.   12/15 HL @ 0600 = 0.4,  INR = 1.69.    Will continue this pt on current drip rate and recheck INR and HL on 12/16 with AM labs. INR being to increase, still subtherapeutic. Will give the patient an extra 2.5mg  tonight on top of her regular scheduled 5mg  tablet.   12/16 @ 06:00= 0.50; INR= 2.23. Will continue current heparin gtt rate. Will recheck heparin level in am. Will give warfarin 5 mg PO x 1 dose today (home dose). Will recheck PT/INR in am.   12/17: INR dropped to 2.0.Will give warfarin 7.5 mg PO x 1.  Will recheck PT/INR with am labs.   12/19: INR 1.61- patient has been off of warfarin for 2 days due to permacath placement. Will reinitiate heparin and warfarin today.  Heparin bolus 3700 units x1 Heparin drip 1050 units/hr. Warfarin 10mg  x1 tonight.  Will check HL in 8hrs and INR and CBC tomorrow morning.   Pharmacy will continue to monitor and adjust per consult.    Loree Fee, PharmD 12/26/2015 3:20 PM

## 2015-12-26 NOTE — Progress Notes (Signed)
Spoke to Dr. Fritzi Mandes about patient's troponin result of 0.18 that Dr. Margaretmary Eddy had ordered.  Explained that she was on heparin drip and coumadin currently trying to attain therapeutic level.  She gave no new orders.

## 2015-12-26 NOTE — Telephone Encounter (Signed)
Patient called wanting Dr. Azalee Course to know that after she had left the hospital, her health care have been going down hill. Patient stated that she had a procedure done yesterday and that even though the Anesthisiologist told her that she was going to be put to sleep, she felt everything. She stated that "she tried telling the doctor's that she was feeling everything what was going on", nobody heard her. She wanted Dr. Azalee Course to know so "she could speak to the doctor at the hospital so they could get things straight". I told her that I would notify Dr. Azalee Course.  Dr. Azalee Course was conatcted by Luetta Nutting, RN and was asked to please go to the hospital and see the patient. Dr. Azalee Course agreed and will go and see her first thing in the morning.  Patient was then called so she knows that Dr. Azalee Course will go and see her tomorrow first thing in the morning. Patient understood and was happy to hear that she was going to see her.

## 2015-12-27 DIAGNOSIS — N189 Chronic kidney disease, unspecified: Secondary | ICD-10-CM

## 2015-12-27 DIAGNOSIS — C50212 Malignant neoplasm of upper-inner quadrant of left female breast: Secondary | ICD-10-CM

## 2015-12-27 LAB — CBC
HCT: 25.5 % — ABNORMAL LOW (ref 35.0–47.0)
HEMOGLOBIN: 8.4 g/dL — AB (ref 12.0–16.0)
MCH: 27.9 pg (ref 26.0–34.0)
MCHC: 33 g/dL (ref 32.0–36.0)
MCV: 84.5 fL (ref 80.0–100.0)
Platelets: 218 10*3/uL (ref 150–440)
RBC: 3.02 MIL/uL — ABNORMAL LOW (ref 3.80–5.20)
RDW: 17.5 % — AB (ref 11.5–14.5)
WBC: 12 10*3/uL — AB (ref 3.6–11.0)

## 2015-12-27 LAB — HEPARIN LEVEL (UNFRACTIONATED)
Heparin Unfractionated: 0.56 IU/mL (ref 0.30–0.70)
Heparin Unfractionated: 0.61 IU/mL (ref 0.30–0.70)

## 2015-12-27 LAB — VANCOMYCIN, RANDOM: VANCOMYCIN RM: 29

## 2015-12-27 LAB — PROTIME-INR
INR: 1.79
PROTHROMBIN TIME: 21 s — AB (ref 11.4–15.2)

## 2015-12-27 LAB — TROPONIN I: Troponin I: 0.15 ng/mL (ref <0.03)

## 2015-12-27 MED ORDER — METOPROLOL TARTRATE 100 MG PO TABS: 50.0000 mg | ORAL_TABLET | Freq: Two times a day (BID) | ORAL | 0 refills | Status: DC

## 2015-12-27 MED ORDER — DIPHENHYDRAMINE HCL 25 MG PO CAPS: 25.0000 mg | ORAL_CAPSULE | Freq: Three times a day (TID) | ORAL | 0 refills | Status: DC | PRN

## 2015-12-27 MED ORDER — ALPRAZOLAM 0.5 MG PO TABS: 0.5000 mg | ORAL_TABLET | Freq: Two times a day (BID) | ORAL | 0 refills | Status: DC | PRN

## 2015-12-27 MED ORDER — VANCOMYCIN HCL IN DEXTROSE 750-5 MG/150ML-% IV SOLN: 750.0000 mg | Freq: Once | INTRAVENOUS | 0 refills | Status: AC

## 2015-12-27 MED ORDER — TRAMADOL HCL 50 MG PO TABS: 50.0000 mg | ORAL_TABLET | Freq: Four times a day (QID) | ORAL | 0 refills | Status: DC | PRN

## 2015-12-27 MED ORDER — VANCOMYCIN HCL IN DEXTROSE 750-5 MG/150ML-% IV SOLN: 750.0000 mg | Freq: Once | INTRAVENOUS | 0 refills | Status: DC

## 2015-12-27 MED ORDER — SENNOSIDES-DOCUSATE SODIUM 8.6-50 MG PO TABS: 1.0000 | ORAL_TABLET | Freq: Every evening | ORAL | Status: DC | PRN

## 2015-12-27 MED ORDER — WARFARIN SODIUM 7.5 MG PO TABS
7.5000 mg | ORAL_TABLET | Freq: Once | ORAL | Status: AC
  Administered 2015-12-27: 7.5 mg via ORAL
  Filled 2015-12-27: qty 1

## 2015-12-27 MED ORDER — WARFARIN SODIUM 5 MG PO TABS
2.5000 mg | ORAL_TABLET | ORAL | Status: AC
  Administered 2015-12-27: 15:00:00 2.5 mg via ORAL
  Filled 2015-12-27: qty 1

## 2015-12-27 MED ORDER — NEPRO/CARBSTEADY PO LIQD
237.0000 mL | Freq: Two times a day (BID) | ORAL | Status: DC
  Administered 2015-12-27: 14:00:00 237 mL via ORAL

## 2015-12-27 MED ORDER — ACETAMINOPHEN 325 MG PO TABS: 650.0000 mg | ORAL_TABLET | Freq: Four times a day (QID) | ORAL | Status: DC | PRN

## 2015-12-27 MED ORDER — ENSURE ENLIVE PO LIQD: 237.0000 mL | Freq: Three times a day (TID) | ORAL | 12 refills | Status: DC

## 2015-12-27 MED ORDER — ASPIRIN 81 MG PO TBEC: 81.0000 mg | DELAYED_RELEASE_TABLET | Freq: Every day | ORAL | Status: DC

## 2015-12-27 NOTE — Progress Notes (Signed)
Post dialysis 

## 2015-12-27 NOTE — Progress Notes (Signed)
ANTICOAGULATION CONSULT NOTE - Follow Up Consult  Pharmacy Consult for warfarin  Indication: hx of VTE   Allergies  Allergen Reactions  . Adhesive [Tape] Itching    Silk tape is ok to use.    Patient Measurements: Height: 5\' 6"  (167.6 cm) Weight: 129 lb 11.2 oz (58.8 kg) IBW/kg (Calculated) : 59.3 Heparin Dosing Weight:   Vital Signs: Temp: 98.1 F (36.7 C) (12/19 1955) Temp Source: Oral (12/19 1955) BP: 121/78 (12/19 1955) Pulse Rate: 112 (12/19 1955)  Labs:  Recent Labs  12/24/15 0507 12/25/15 0520 12/26/15 0501 12/26/15 1516 12/26/15 2026 12/27/15 0120  HGB 8.3* 8.7* 9.2*  --   --   --   HCT 25.1* 26.0* 27.4*  --   --   --   PLT 187 199 206  --   --   --   LABPROT 23.0* 21.3* 19.3*  --   --   --   INR 2.00 1.82 1.61  --   --   --   HEPARINUNFRC  --   --   --   --   --  0.56  CREATININE  --  4.79* 4.37*  --   --   --   TROPONINI  --   --   --  0.18* 0.17*  --     Estimated Creatinine Clearance: 12.5 mL/min (by C-G formula based on SCr of 4.37 mg/dL (H)).   Assessment:  Pharmacy consulted for heparin drip management for 60 yo female transferred to ICU due to flash pulmonary edema and requiring emergent intubation. Pharmacy previously consulted for warfarin management; patient is currently subtherapeutic. Patient has history significant for DVT x 2 and required warfarin as an outpatient. Patient initiated on heparin drip went transitioned to ICU. Heparin drip was held due to therapeutic INR. Port removal today 12/13. INR now subtherapeutic at 1.61.  Pharmacy consulted for heparin drip dosing and monitoring for bridge to therapeutic INR due to PMH of DVT.   Patients home dose:  M,T,W, Th :7.5mg                                      F, Sa, Sun: 5mg     DATE:          INR:            DOSE  12/13            1.61        7.5mg  12/14          1.33      7.5mg   12/15           1.69             7.5mg        12/16             2.23             5 mg   12/17          2.00        Held  12/18             1.82             Held  12/19            1.61   Goal of Therapy:  Anti-Xa 0.3-0.7  INR: 2-3 Monitor platelets by anticoagulation protocol: Yes   Plan:  Heparin 900units/hr. No bolus. Will  check Anti-Xa in 6 hours.   Will start patient on home dose of warfarin 7.5mg  M,T,W,Th and 5mg  F,Sat,Sun.   INRs ordered daily. Heparin drip to be stopped after INR therapeutic.   12/13:  HL @ 22:45 = 0.21 .   Will order heparin 900 units IV X 1 bolus and increase drip rate to 1100 units/hr.  Will recheck HL 8 hrs after rate change.   12/14 HL: 0.43 . Will continue heparin drip at 1100 units/hr. Will recheck heparin level in 6 hours for confirmation. INR subtherapeutic at 1.33. Will continue with patients home dose of 7.5mg  today. Expect INR to start trending up in next 48 hours.  12/14 HL: 0.32. This is the second level with in therapeutic range. Will continue heparin drip at 1100 units/hr. HL and CBC ordered with AM labs.   12/15 HL @ 0600 = 0.4,  INR = 1.69.    Will continue this pt on current drip rate and recheck INR and HL on 12/16 with AM labs. INR being to increase, still subtherapeutic. Will give the patient an extra 2.5mg  tonight on top of her regular scheduled 5mg  tablet.   12/16 @ 06:00= 0.50; INR= 2.23. Will continue current heparin gtt rate. Will recheck heparin level in am. Will give warfarin 5 mg PO x 1 dose today (home dose). Will recheck PT/INR in am.   12/17: INR dropped to 2.0.Will give warfarin 7.5 mg PO x 1.  Will recheck PT/INR with am labs.   12/19: INR 1.61- patient has been off of warfarin for 2 days due to permacath placement. Will reinitiate heparin and warfarin today.  Heparin bolus 3700 units x1 Heparin drip 1050 units/hr. Warfarin 10mg  x1 tonight.  Will check HL in 8hrs and INR and CBC tomorrow morning.   12/20 0120 HL therapeutic x 1. Continue current rate. Will recheck HL in 8 hours.  Pharmacy will continue to monitor and adjust per  consult.    Laural Benes, PharmD, BCPS 12/27/2015 1:51 AM

## 2015-12-27 NOTE — Progress Notes (Signed)
Start of hd 

## 2015-12-27 NOTE — Progress Notes (Signed)
PT Cancellation Note  Patient Details Name: Teresa Cook MRN: [id number] DOB: April 11, 1955   Cancelled Treatment:    Reason Eval/Treat Not Completed: Other (comment). Consult received and chart reviewed. Pt currently at HD at this time, unavailable for treatment. Will re-attempt at another time.   Saeed Toren 12/27/2015, 10:26 AM  Greggory Stallion, PT, DPT [phone]

## 2015-12-27 NOTE — Discharge Summary (Signed)
[facility] NAME: Teresa Cook    MR#:  [id number]  DATE OF BIRTH:  1955/07/28  DATE OF ADMISSION:  12/15/2015 ADMITTING PHYSICIAN: Algernon Huxley, MD  DATE OF DISCHARGE: 12/28/15 PRIMARY CARE PHYSICIAN: Murlean Iba, MD    ADMISSION DIAGNOSIS:  Sepsis, due to unspecified organism ([place]) [A41.9]  DISCHARGE DIAGNOSIS:  Active Problems:   Sepsis ([place])   Healthcare-associated pneumonia   Bloodstream infection due to port-a-cath, initial encounter   SECONDARY DIAGNOSIS:   Past Medical History:  Diagnosis Date  . Anemia   . Anxiety   . Breast cancer ([place])   . CHF (congestive heart failure) ([place])   . Chronic kidney disease   . Depression   . Dialysis patient ([place])   . DVT (deep venous thrombosis) ([facility])    left leg  . DVT (deep venous thrombosis) ([place])   . Gout   . HTN (hypertension)   . Hypertension   . Parathyroid abnormality ([place])   . Parathyroid disease ([place])   . Psoriasis   . Renal insufficiency   . Sickle cell anemia (H[facility])    traits    HOSPITAL COURSE:   #.Acute Respiratory failure secondary to combination of fluid overload, multifocal pneumonia. Status post extubation . Clinically improving continue oxygen 2 L via nasal cannula, weaned off to roomair Patient is transferred to floor from  [facility] 12/20/2015  #.  sepsis: Secondary to bacteremia  Blood cultures from 12 /9 from HD center diphtheroids. Continue vancomycin until 12/30/2015. Discontinued cefepime on 12/21/2015. procalcitonin level 89.  Cultures from 12/10 no growth, his permacath removal, per catheter tip cultures from December 9 didn't show any growth. - Dr. Heath Lark has recommended new permacath placement on the right side of the chest only  patient  had a new permacath placement in the right groin area12/18/17. Patient wants to talk to vascular surgeryAddressed on patient's concerns yesterday to her satisfaction  Refusing fistula versus  graft   # Sinus tachycardia Metoprolol started, recommending to continue metoprolol 50 mg by mouth twice a day Appreciate [facility] cardiao recommendations  cycle troponins-0.18-0.1 7.-0.15  #.Anemia of chronic kidney disease. Hemoglobin dropped yesterday 12/20/2015, received one unit transfusion. hemoglobin 7.2 -7.1 -8.3 --8.7-9.2--8.4-8.2 Monitor closely. On Coumadin   #.history of breast cancer finished chemotherapy. Port is removed for sepsis Outpatient follow-up with Dr. Callie Fielding for breast resection on the left  # essential hypertension-blood pressure is stable  #history of left leg DVT: Because of anemia stopped  Warfarin for a few days during the hospital course which is resumed at this time as no active bleeding.  Transmitted 1 unit of packed RBC, hemoglobin  at 7.1  heparin drip for bridging during the hospital course No active bleeding Today patient INR is at 1.85, will receive 7.5 mg of Coumadin prior to discharge and will continue her home dose. Repeat INR on Friday during dialysis and nephrologist to monitor and adjust the Coumadin dose. Patient has an of Coumadin supply at home she reported Outpatient follow-up with [place] patient's primary oncologist in a month is recommended  # end-stage renal disease continue hemodialysis on Monday Wednesday Friday-[facility] nephrology Patient has temporary right femoral dialysis catheter at this time which will be removed prior to discharge Patient had new right groin permacath placementon 12/25/2015. Patient is planning to change to Dr. 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 surgery, Nephrology, ID, pulmonary, oncology, cardiology followed the patient. Physical therapy consult.  DISCHARGE CONDITIONS:   Stable  CONSULTS OBTAINED:  Treatment Team:  Lavonia Dana, MD [name],  MD Leonel Ramsay, MD Isaias Cowman, MD   PROCEDURES Right groin permacath placement Old permacath and port removal  DRUG ALLERGIES:   Allergies  Allergen Reactions  . Adhesive [Tape] Itching    Silk tape is ok to use.    DISCHARGE MEDICATIONS:   Current Discharge Medication List    START taking these medications   Details  acetaminophen (TYLENOL) 325 MG tablet Take 2 tablets (650 mg total) by mouth every 6 (six) hours as needed for mild pain (or Fever >/= 101).    ALPRAZolam (XANAX) 0.5 MG tablet Take 1 tablet (0.5 mg total) by mouth 2 (two) times daily as needed for anxiety. Qty: 20 tablet, Refills: 0    aspirin EC 81 MG EC tablet Take 1 tablet (81 mg total) by mouth daily.    diphenhydrAMINE (BENADRYL) 25 mg capsule Take 1 capsule (25 mg total) by mouth every 8 (eight) hours as needed for itching. Qty: 30 capsule, Refills: 0    feeding supplement, ENSURE ENLIVE, (ENSURE ENLIVE) LIQD Take 237 mLs by mouth 3 (three) times daily between meals. Qty: 237 mL, Refills: 12    senna-docusate (SENOKOT-S) 8.6-50 MG tablet Take 1 tablet by mouth at bedtime as needed for mild constipation.    Vancomycin (VANCOCIN) 750-5 MG/150ML-% SOLN Inject 150 mLs (750 mg total) into the vein once. Qty: 150 mL, Refills: 0      CONTINUE these medications which have CHANGED   Details  metoprolol (LOPRESSOR) 100 MG tablet Take 0.5 tablets (50 mg total) by mouth 2 (two) times daily. Takes on non-dialysis days=Tuesday, Thursday, Saturday and Sunday. Qty: 60 tablet, Refills: 0    traMADol (ULTRAM) 50 MG tablet Take 1 tablet (50 mg total) by mouth every 6 (six) hours as needed for moderate pain. Qty: 20 tablet, Refills: 0      CONTINUE these medications which have NOT CHANGED   Details  benzonatate (TESSALON) 200 MG capsule Take 200 mg by mouth 3 (three) times daily as needed for cough.    losartan (COZAAR) 50 MG tablet Take 100 mg by mouth daily. Takes on non-dialysis days=Tuesday,  Thursday, Saturday and Sunday.    pantoprazole (PROTONIX) 40 MG tablet Take 1 tablet (40 mg total) by mouth 2 (two) times daily before a meal. Qty: 60 tablet, Refills: 0    sertraline (ZOLOFT) 100 MG tablet Take 100 mg by mouth daily.     warfarin (COUMADIN) 5 MG tablet Take 1 tablet Friday, Saturday and Sundays. Take 1 1/2 tablets the other days of the week    zolpidem (AMBIEN) 10 MG tablet Take 10 mg by mouth at bedtime as needed for sleep.      STOP taking these medications     amoxicillin-clavulanate (AUGMENTIN) 500-125 MG tablet      clonazePAM (KLONOPIN) 1 MG tablet      amoxicillin-clavulanate (AUGMENTIN) 500-125 MG tablet          DISCHARGE INSTRUCTIONS:   Continue hemodialysis on Monday, Wednesday and Friday. Please give one dose of IV vancomycin on 12/29/2015 after dialysis Nephrology to check PT/INR on 12/29/2015 and titrate Coumadin dose as needed Follow-up with the nephrology Dr. Candiss Norse on Friday Follow-up with surgery Dr. Callie Fielding as scheduled on January 16 Follow-up with [facility] cardiology in 1-2 weeks  DIET:  Renal diet  DISCHARGE CONDITION:  Stable  ACTIVITY:  Activity as tolerated per PT  OXYGEN:  Home Oxygen: No.   Oxygen Delivery: room air  DISCHARGE LOCATION:  home   If you experience worsening of your admission symptoms, develop shortness of breath, life threatening emergency, suicidal or homicidal thoughts you must seek medical attention immediately by calling 911 or calling your MD immediately  if symptoms less severe.  You Must read complete instructions/literature along with all the possible adverse reactions/side effects for all the Medicines you take and that have been prescribed to you. Take any new Medicines after you have completely understood and accpet all the possible adverse reactions/side effects.   Please note  You were cared for by a hospitalist during your hospital stay. If you have any questions about your discharge medications  or the care you received while you were in the hospital after you are discharged, you can call the unit and asked to speak with the hospitalist on call if the hospitalist that took care of you is not available. Once you are discharged, your primary care physician will handle any further medical issues. Please note that NO REFILLS for any discharge medications will be authorized once you are discharged, as it is imperative that you return to your primary care physician (or establish a relationship with a primary care physician if you do not have one) for your aftercare needs so that they can reassess your need for medications and monitor your lab values.     Today  Chief Complaint  Patient presents with  . Weakness   Patient is doing much better today. Pain in the right groin site is much better. Awaiting for hemodialysis and PT evaluation. Wants to talk to vascular surgery before she leaves the hospital notified vascular surgery yesterday and Dr. Callie Fielding will talk to them today  ROS:  CONSTITUTIONAL: Denies fevers, chills. Denies any fatigue, weakness.  EYES: Denies blurry vision, double vision, eye pain. EARS, NOSE, THROAT: Denies tinnitus, ear pain, hearing loss. RESPIRATORY: Denies cough, wheeze, shortness of breath.  CARDIOVASCULAR: Denies chest pain, palpitations, edema.  GASTROINTESTINAL: Denies nausea, vomiting, diarrhea, abdominal pain. Denies bright red blood per rectum. GENITOURINARY: Denies dysuria, hematuria. ENDOCRINE: Denies nocturia or thyroid problems. HEMATOLOGIC AND LYMPHATIC: Denies easy bruising or bleeding. SKIN: Denies rash or lesion. MUSCULOSKELETAL: Right groin pain is better Denies pain in neck, back, shoulder, knees, hips or arthritic symptoms.  NEUROLOGIC: Denies paralysis, paresthesias.  PSYCHIATRIC: Denies anxiety or depressive symptoms.   VITAL SIGNS:  Blood pressure 107/75, pulse (!) 109, temperature 98.3 F (36.8 C), temperature source Oral, resp. rate  18, height 5\' 6"  (1.676 m), weight 58.8 kg (129 lb 10.1 oz), SpO2 99 %.  I/O:    Intake/Output Summary (Last 24 hours) at 12/28/15 1351 Last data filed at 12/27/15 2338  Gross per 24 hour  Intake              120 ml  Output                0 ml  Net              120 ml    PHYSICAL EXAMINATION:  GENERAL:  60 y.o.-year-old patient lying in the bed with no acute distress.  EYES: Pupils equal, round, reactive to light and accommodation. No scleral icterus. Extraocular muscles intact.  HEENT: Head atraumatic, normocephalic. Oropharynx and nasopharynx clear.  NECK:  Supple, no jugular venous distention. No thyroid enlargement, no  tenderness.  LUNGS: Normal breath sounds bilaterally, no wheezing, rales,rhonchi or crepitation. No use of accessory muscles of respiration.  CARDIOVASCULAR: S1, S2 normal. No murmurs, rubs, or gallops.  ABDOMEN: Soft, non-tender, non-distended. Bowel sounds present. No organomegaly or mass.  EXTREMITIES: Right groin area with the new permacath less tender today. Left groin area with temporary dialysis catheter  No pedal edema, cyanosis, or clubbing.  NEUROLOGIC: Cranial nerves II through XII are intact. Muscle strength 5/5 in all extremities. Sensation intact. Gait not checked.  PSYCHIATRIC: The patient is alert and oriented x 3.  SKIN: No obvious rash, lesion, or ulcer.   DATA REVIEW:   CBC  Recent Labs Lab 12/28/15 0443  WBC 11.6*  HGB 8.2*  HCT 24.8*  PLT 227    Chemistries   Recent Labs Lab 12/22/15 0603  12/26/15 0501  NA 140  < > 135  K 4.2  < > 4.2  CL 106  < > 97*  CO2 25  < > 28  GLUCOSE 93  < > 89  BUN 26*  < > 23*  CREATININE 5.51*  < > 4.37*  CALCIUM 9.3  < > 10.3  MG 2.1  --   --   < > = values in this interval not displayed.  Cardiac Enzymes  Recent Labs Lab 12/27/15 0512  TROPONINI 0.15*    Microbiology Results  Results for orders placed or performed during the hospital encounter of 12/15/15  Blood Culture (routine  x 2)     Status: None   Collection Time: 12/15/15  7:52 PM  Result Value Ref Range Status   Specimen Description BLOOD LEFT ASSIST CONTROL  Final   Special Requests   Final    BOTTLES DRAWN AEROBIC AND ANAEROBIC 12CCAERO,14CCANA   Culture NO GROWTH 5 DAYS  Final   Report Status 12/20/2015 FINAL  Final  Blood Culture (routine x 2)     Status: None   Collection Time: 12/15/15  7:53 PM  Result Value Ref Range Status   Specimen Description BLOOD L HAND  Final   Special Requests   Final    BOTTLES DRAWN AEROBIC AND ANAEROBIC AER 11CC, ANA 10CC   Culture NO GROWTH 5 DAYS  Final   Report Status 12/20/2015 FINAL  Final  Urine culture     Status: None   Collection Time: 12/15/15  8:42 PM  Result Value Ref Range Status   Specimen Description URINE, RANDOM  Final   Special Requests NONE  Final   Culture NO GROWTH Performed at [facility]   Final   Report Status 12/17/2015 FINAL  Final  Culture, sputum-assessment     Status: None   Collection Time: 12/15/15 11:31 PM  Result Value Ref Range Status   Specimen Description EXPECTORATED SPUTUM  Final   Special Requests Immunocompromised  Final   Sputum evaluation   Final    Sputum specimen not acceptable for testing.  Please recollect.   [name] AT 5093 12/16/15.PMH   Report Status 12/16/2015 FINAL  Final  MRSA PCR Screening     Status: None   Collection Time: 12/15/15 11:31 PM  Result Value Ref Range Status   MRSA by PCR NEGATIVE NEGATIVE Final    Comment:        The GeneXpert MRSA Assay (FDA approved for NASAL specimens only), is one component of a comprehensive MRSA colonization surveillance program. It is not intended to diagnose MRSA infection nor to guide or monitor treatment for MRSA infections.  Cath Tip Culture     Status: None   Collection Time: 12/16/15  3:30 PM  Result Value Ref Range Status   Specimen Description HEMODIALYSIS CATHETER  Final   Special Requests NONE  Final   Culture   Final    NO  GROWTH 3 DAYS Performed at [facility]    Report Status 12/20/2015 FINAL  Final  MRSA PCR Screening     Status: None   Collection Time: 12/17/15 11:32 AM  Result Value Ref Range Status   MRSA by PCR NEGATIVE NEGATIVE Final    Comment:        The GeneXpert MRSA Assay (FDA approved for NASAL specimens only), is one component of a comprehensive MRSA colonization surveillance program. It is not intended to diagnose MRSA infection nor to guide or monitor treatment for MRSA infections.   Culture, blood (single) w Reflex to ID Panel     Status: None   Collection Time: 12/17/15  5:30 PM  Result Value Ref Range Status   Specimen Description PORTA CATH  Final   Special Requests BOTTLES DRAWN AEROBIC AND ANAEROBIC 5ML  Final   Culture NO GROWTH 5 DAYS  Final   Report Status 12/22/2015 FINAL  Final    RADIOLOGY:  No results found.  EKG:   Orders placed or performed during the hospital encounter of 12/15/15  . ED EKG  . ED EKG  . EKG 12-Lead  . EKG 12-Lead  . EKG 12-Lead  . EKG 12-Lead  . ED EKG  . ED EKG      Management plans discussed with the patient, family and they are in agreement.  CODE STATUS:     Code Status Orders        Start     Ordered   12/15/15 2241  Full code  Continuous     12/15/15 2240    Code Status History    Date Active Date Inactive Code Status Order ID Comments User Context   08/31/2015  4:26 AM 09/02/2015  2:35 PM Full Code [id number]  Harrie Foreman, MD Inpatient   08/27/2015  4:06 AM 08/29/2015  3:57 PM Full Code [id number]  Saundra Shelling, MD ED   07/09/2015  2:44 PM 07/12/2015  7:55 PM Full Code [id number]  Laverle Hobby, MD ED   09/19/2014 11:47 AM 09/20/2014  6:25 PM Full Code [id number]  Dustin Flock, MD ED   08/29/2014  2:28 PM 08/30/2014  5:43 PM Full Code [id number]  Fritzi Mandes, MD Inpatient    Advance Directive Documentation   Flowsheet Row Most Recent Value  Type of Advance Directive  Living will  Pre-existing out of  facility DNR order (yellow form or pink MOST form)  No data  "MOST" Form in Place?  No data      TOTAL TIME TAKING CARE OF THIS PATIENT: 45  minutes.   Note: This dictation was prepared with Dragon dictation along with smaller phrase technology. Any transcriptional errors that result from this process are unintentional.   @[facility] @  on 12/28/2015 at 1:51 PM  Between 7am to 6pm - Pager - [phone]  After 6pm go to [email or web address] - password [email or web address] [facility]  Office  [phone]  CC: Primary care physician; Murlean Iba, MD

## 2015-12-27 NOTE — Evaluation (Signed)
Physical Therapy Evaluation Patient Details Name: Teresa Cook MRN: [id number] DOB: August 08, 1955 Today's Date: 12/27/2015   History of Present Illness  Pt admitted for sepsis. Pt with downtrending troponin. Pt with history of HTN, ESRD, heart failure, DVT, and breast cancer. Pt complains of SOB with fever upon admission. The next date, pt with respiratory distress required rapid response and intubation on 12/10-12/11. Pt now with HD femoral perm cath placed on 12/18. Of note, port cath removed on 12/13. Pt evaluated post HD this date.  Clinical Impression  Pt is a pleasant 60 year old female who was admitted for sepsis. Pt performs bed mobility with mod I, transfers with cga, and ambulation with min assist using IV pole for assistance. Pt very unsteady on feet, recommend RW for further mobility. Complains of dizziness with ambulation. Pt demonstrates deficits with strength/endurance/balance. Would benefit from skilled PT to address above deficits and promote optimal return to PLOF. Recommend transition to [place] upon discharge from acute hospitalization.       Follow Up Recommendations Home health PT    Equipment Recommendations  None recommended by PT    Recommendations for Other Services       Precautions / Restrictions Precautions Precautions: Fall Restrictions Weight Bearing Restrictions: No      Mobility  Bed Mobility Overal bed mobility: Modified Independent             General bed mobility comments: safe technique performed although takes increased time for performance. Once seated at EOB, able to sit with supervision  Transfers Overall transfer level: Needs assistance Equipment used: None Transfers: Sit to/from Stand Sit to Stand: Min guard         General transfer comment: unsteady with initial transfer. Once standing, requests to hold onto IV pole. Complains of dizziness with standing.  Ambulation/Gait Ambulation/Gait assistance: Min assist Ambulation  Distance (Feet): 60 Feet Assistive device: None Gait Pattern/deviations: Step-through pattern     General Gait Details: short reciprocal gait pattern performed with pt complaining of dizziness with increased exertion. Suggested return to room. Used IV pole for ambulation.  Stairs            Wheelchair Mobility    Modified Rankin (Stroke Patients Only)       Balance Overall balance assessment: Needs assistance Sitting-balance support: Feet supported Sitting balance-Leahy Scale: Good     Standing balance support: No upper extremity supported Standing balance-Leahy Scale: Poor                               Pertinent Vitals/Pain Pain Assessment: No/denies pain    Home Living Family/patient expects to be discharged to:: Private residence Living Arrangements: Spouse/significant other Available Help at Discharge: Family Type of Home: House Home Access: Stairs to enter Entrance Stairs-Rails: None Technical brewer of Steps: 2 Home Layout: One level Home Equipment: Environmental consultant - 2 wheels      Prior Function Level of Independence: Independent with assistive device(s)               Hand Dominance        Extremity/Trunk Assessment   Upper Extremity Assessment Upper Extremity Assessment: Generalized weakness (B UE grossly 4/5)    Lower Extremity Assessment Lower Extremity Assessment: Generalized weakness (B LE grossly 4/5)       Communication   Communication: No difficulties  Cognition Arousal/Alertness: Awake/alert Behavior During Therapy: WFL for tasks assessed/performed Overall Cognitive Status: Within Functional Limits for tasks assessed  General Comments      Exercises     Assessment/Plan    PT Assessment Patient needs continued PT services  PT Problem List Decreased strength;Decreased activity tolerance;Decreased balance;Decreased mobility          PT Treatment Interventions Gait training;DME  instruction;Therapeutic exercise    PT Goals (Current goals can be found in the Care Plan section)  Acute Rehab PT Goals Patient Stated Goal: to go home PT Goal Formulation: With patient Time For Goal Achievement: 01/10/16 Potential to Achieve Goals: Good    Frequency Min 2X/week   Barriers to discharge        Co-evaluation               End of Session Equipment Utilized During Treatment: Gait belt Activity Tolerance: Patient limited by fatigue Patient left: in bed;with bed alarm set Nurse Communication: Mobility status         Time: 6579-0383 PT Time Calculation (min) (ACUTE ONLY): 16 min   Charges:   PT Evaluation $PT Eval Moderate Complexity: 1 Procedure     PT G Codes:        Cyrus Ramsburg 01-17-2016, 5:03 PM  Greggory Stallion, PT, DPT [phone]

## 2015-12-27 NOTE — Progress Notes (Signed)
Pre hd assessment  

## 2015-12-27 NOTE — Progress Notes (Signed)
[facility]  Daily Progress Note   Subjective  - 2 Days Post-Op  Patient seen today on dialysis.   She is very upset that she has a femoral permcath in place. We had a lengthy discussion as to why she has a femoral permcath in place.  Her left jugular is not currently an option at the request of her general surgeon due to her upcoming mastectomy and node dissection scheduled for next month.  It may be following this.  She had kept a right jugular catheter for 4 years and refused AVG and AVF for many years despite her nephrologists request to consider this. This right jugular dialysis catheter was removed by Dr. Lorenso Courier about two weeks ago now when she had bacteremia/infection.  Her left sided port was also removed at a later date.   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  Subclavian vein catheters are generally not recommended due to central venous stenosis issues and limitations for future access. With the left side not currently an option, the femoral vein was the only reasonable option for placement and this was done. She seems to understand this now. The catheter is working well and she is using it for dialysis currently.  Objective Vitals:   12/27/15 0956 12/27/15 1005 12/27/15 1030 12/27/15 1100  BP: 135/87 (!) 128/102 119/76 118/69  Pulse: (!) 102 (!) 107 100 (!) 110  Resp: 13 (!) 21 (!) 23 (!) 22  Temp: 98.3 F (36.8 C)     TempSrc: Oral     SpO2: 97% 100% 98% 100%  Weight: 59.8 kg (131 lb 13.4 oz)     Height:        Intake/Output Summary (Last 24 hours) at 12/27/15 1136 Last data filed at 12/27/15 0115  Gross per 24 hour  Intake           332.58 ml  Output                0 ml  Net           332.58 ml    PULM  CTAB CV  RRR VASC  Catheter in right femoral location hooked up to dialysis.    Laboratory CBC     Component Value Date/Time   WBC 12.0 (H) 12/27/2015 0512   HGB 8.4 (L) 12/27/2015 0512   HGB 11.7 (L) 02/03/2014 0135   HCT 25.5 (L) 12/27/2015 0512   HCT 35.9 02/03/2014 0135   PLT 218 12/27/2015 0512   PLT 205 02/03/2014 0135    BMET    Component Value Date/Time   NA 135 12/26/2015 0501   NA 138 02/03/2014 0135   K 4.2 12/26/2015 0501   K 4.0 02/03/2014 0135   CL 97 (L) 12/26/2015 0501   CL 102 02/03/2014 0135   CO2 28 12/26/2015 0501   CO2 23 02/03/2014 0135   GLUCOSE 89 12/26/2015 0501   GLUCOSE 101 (H) 02/03/2014 0135   BUN 23 (H) 12/26/2015 0501   BUN 21 (H) 02/03/2014 0135   CREATININE 4.37 (H) 12/26/2015 0501   CREATININE 5.11 (H) 02/03/2014 0135   CALCIUM 10.3 12/26/2015 0501   CALCIUM 8.6 02/03/2014 0135   GFRNONAA 10 (L) 12/26/2015 0501   GFRNONAA 9 (L) 02/03/2014 0135   GFRNONAA 6 (L) 09/08/2013 0133   GFRAA 12 (L) 12/26/2015 0501  GFRAA 11 (L) 02/03/2014 0135   GFRAA 7 (L) 09/08/2013 0133    Assessment/Planning: POD #2 s/p right femoral permcath placement   She seems more understanding of why she has a permcath in the right femoral at this point  We will be happy to try to place a left jugular permcath after her breast surgery next month, and in discussions with Dr. Azalee Course this will be the plan.    I discussed with the patient that the left jugular vein may also be unusable due to long term catheter dependence and the high likelihood of central venous occlusion as well.  This may also preclude any AVF or AVG in the upper extremities in the future, although she is likely to refuse these as well.  If the left jugular vein is found to be unusable next month, she will be dependent on femoral permcaths as her access option unless she considers a femoral AVG.  Discussed this situation is detail with the patient today, and she seems to be more understanding at this point.  Leotis Pain  12/27/2015, 11:36 AM

## 2015-12-27 NOTE — Progress Notes (Signed)
[facility]  ROUNDING NOTE   Subjective:  Patient seen and evaluated during hemodialysis. She appears to be tolerating this well. She remains on vancomycin 750 mg IV on Monday, Wednesday, Friday. Her right femoral PermCath appears to be functioning well.  Objective:  Vital signs in last 24 hours:  Temp:  [98.1 F (36.7 C)-98.5 F (36.9 C)] 98.3 F (36.8 C) (12/20 0956) Pulse Rate:  [97-112] 112 (12/20 1130) Resp:  [13-27] 27 (12/20 1130) BP: (91-135)/(68-102) 120/89 (12/20 1130) SpO2:  [96 %-100 %] 100 % (12/20 1130) Weight:  [58.8 kg (129 lb 9 oz)-59.8 kg (131 lb 13.4 oz)] 59.8 kg (131 lb 13.4 oz) (12/20 0956)  Weight change: -2.268 kg (-5 lb) Filed Weights   12/27/15 0500 12/27/15 0559 12/27/15 0956  Weight: 59 kg (130 lb) 58.8 kg (129 lb 9 oz) 59.8 kg (131 lb 13.4 oz)    Intake/Output: I/O last 3 completed shifts: In: 452.6 [P.O.:360; I.V.:92.6] Out: 956 [Other:956]   Intake/Output this shift:  No intake/output data recorded.  Physical Exam: General: chronically ill appearing  ENT Moist oral mucus membranes  Eyes: anicteric  Neck: supple  Lungs:  Bilateral rhonchi, crackles at bases  Heart: Irregular, A Fib  Abdomen:  Soft, nontender, BS present  Extremities: no peripheral edema  Neurologic: Awake, alert, follows commands  Skin: No lesions  Access: Right femoral permcath 22/97/98    Basic Metabolic Panel:  Recent Labs Lab 12/21/15 0607 12/22/15 0603 12/25/15 0520 12/25/15 2030 12/26/15 0501  NA 139 140 136  --  135  K 3.0* 4.2 4.1  --  4.2  CL 102 106 97*  --  97*  CO2 '29 25 28  ' --  28  GLUCOSE 97 93 94  --  89  BUN 17 26* 27*  --  23*  CREATININE 3.41* 5.51* 4.79*  --  4.37*  CALCIUM 9.2 9.3 10.0  --  10.3  MG  --  2.1  --   --   --   PHOS  --  3.1  --  4.8*  --     Liver Function Tests: No results for input(s): AST, ALT, ALKPHOS, BILITOT, PROT, ALBUMIN in the last 168 hours. No results for input(s): LIPASE, AMYLASE in the last  168 hours. No results for input(s): AMMONIA in the last 168 hours.  CBC:  Recent Labs Lab 12/22/15 0603 12/24/15 0507 12/25/15 0520 12/26/15 0501 12/27/15 0512  WBC 4.8 7.2 9.2 10.0 12.0*  HGB 7.1* 8.3* 8.7* 9.2* 8.4*  HCT 21.5* 25.1* 26.0* 27.4* 25.5*  MCV 85.0 83.7 83.9 84.8 84.5  PLT 148* 187 199 206 218    Cardiac Enzymes:  Recent Labs Lab 12/26/15 1516 12/26/15 2026 12/27/15 0512  TROPONINI 0.18* 0.17* 0.15*    BNP: Invalid input(s): POCBNP  CBG:  Recent Labs Lab 12/20/15 1958 12/21/15 0118 12/21/15 0439  GLUCAP 114* 93 105*    Microbiology: Results for orders placed or performed during the hospital encounter of 12/15/15  Blood Culture (routine x 2)     Status: None   Collection Time: 12/15/15  7:52 PM  Result Value Ref Range Status   Specimen Description BLOOD LEFT ASSIST CONTROL  Final   Special Requests   Final    BOTTLES DRAWN AEROBIC AND ANAEROBIC 12CCAERO,14CCANA   Culture NO GROWTH 5 DAYS  Final   Report Status 12/20/2015 FINAL  Final  Blood Culture (routine x 2)     Status: None   Collection Time: 12/15/15  7:53 PM  Result Value Ref Range Status   Specimen Description BLOOD L HAND  Final   Special Requests   Final    BOTTLES DRAWN AEROBIC AND ANAEROBIC AER 11CC, ANA 10CC   Culture NO GROWTH 5 DAYS  Final   Report Status 12/20/2015 FINAL  Final  Urine culture     Status: None   Collection Time: 12/15/15  8:42 PM  Result Value Ref Range Status   Specimen Description URINE, RANDOM  Final   Special Requests NONE  Final   Culture NO GROWTH Performed at [facility]   Final   Report Status 12/17/2015 FINAL  Final  Culture, sputum-assessment     Status: None   Collection Time: 12/15/15 11:31 PM  Result Value Ref Range Status   Specimen Description EXPECTORATED SPUTUM  Final   Special Requests Immunocompromised  Final   Sputum evaluation   Final    Sputum specimen not acceptable for testing.  Please recollect.   [name]  AT 2353 12/16/15.PMH   Report Status 12/16/2015 FINAL  Final  MRSA PCR Screening     Status: None   Collection Time: 12/15/15 11:31 PM  Result Value Ref Range Status   MRSA by PCR NEGATIVE NEGATIVE Final    Comment:        The GeneXpert MRSA Assay (FDA approved for NASAL specimens only), is one component of a comprehensive MRSA colonization surveillance program. It is not intended to diagnose MRSA infection nor to guide or monitor treatment for MRSA infections.   Cath Tip Culture     Status: None   Collection Time: 12/16/15  3:30 PM  Result Value Ref Range Status   Specimen Description HEMODIALYSIS CATHETER  Final   Special Requests NONE  Final   Culture   Final    NO GROWTH 3 DAYS Performed at [facility]    Report Status 12/20/2015 FINAL  Final  MRSA PCR Screening     Status: None   Collection Time: 12/17/15 11:32 AM  Result Value Ref Range Status   MRSA by PCR NEGATIVE NEGATIVE Final    Comment:        The GeneXpert MRSA Assay (FDA approved for NASAL specimens only), is one component of a comprehensive MRSA colonization surveillance program. It is not intended to diagnose MRSA infection nor to guide or monitor treatment for MRSA infections.   Culture, blood (single) w Reflex to ID Panel     Status: None   Collection Time: 12/17/15  5:30 PM  Result Value Ref Range Status   Specimen Description PORTA CATH  Final   Special Requests BOTTLES DRAWN AEROBIC AND ANAEROBIC 5ML  Final   Culture NO GROWTH 5 DAYS  Final   Report Status 12/22/2015 FINAL  Final    Coagulation Studies:  Recent Labs  12/25/15 0520 12/26/15 0501 12/27/15 0512  LABPROT 21.3* 19.3* 21.0*  INR 1.82 1.61 1.79    Urinalysis: No results for input(s): COLORURINE, LABSPEC, PHURINE, GLUCOSEU, HGBUR, BILIRUBINUR, KETONESUR, PROTEINUR, UROBILINOGEN, NITRITE, LEUKOCYTESUR in the last 72 hours.  Invalid input(s): APPERANCEUR    Imaging: No results found.   Medications:   .  heparin 1,050 Units/hr (12/27/15 0115)   . aspirin EC  81 mg Oral Daily  . clonazePAM  1 mg Oral QHS  . feeding supplement (ENSURE ENLIVE)  237 mL Oral TID BM  . guaiFENesin  1,200 mg Oral BID  . lidocaine  20 mL Intradermal Once  . losartan  100 mg Oral Once per  day on Sun Tue Thu Sat  . mouth rinse  15 mL Mouth Rinse BID  . metoprolol  50 mg Oral 2 times per day on Sun Tue Thu Sat  . midazolam  4 mg Intravenous Once  . pantoprazole  40 mg Oral BID AC  . sertraline  50 mg Oral QHS  . sodium chloride flush  10-40 mL Intracatheter Q12H  . vancomycin  750 mg Intravenous Q M,W,F-HD  . warfarin  7.5 mg Oral ONCE-1800  . Warfarin - Pharmacist Dosing Inpatient   Does not apply q1800   acetaminophen **OR** acetaminophen, ALPRAZolam, benzonatate, bisacodyl, diphenhydrAMINE, heparin lock flush, heparin lock flush, HYDROcodone-acetaminophen, ipratropium-albuterol, metoprolol, phenol, senna-docusate, sodium chloride flush, sodium chloride flush, zolpidem  Assessment/ Plan:  Ms. Mechell Girgis is a 60 y.o. 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 [place].  1. ESRD on HD MWF: with acute pulmonary edema  Patient seen and evaluated during hemodialysis. This appears to be progressing well. Complete hemodialysis today.  2. Bacteremia/sepsis: 12/9 gram positive bacilli from [place].- Corynebacterium Permcath removed. Blood cultures and cath tip cultures are no growth  chemotherapy infusion port removed this admission - right femoral PermCath placed.  The patient is rather hesitant to have an AV graft for fistula placed.  She will need vancomycin 750 mg IV with each dialysis treatment for an additional 2 weeks.  This order was given to  RN Demetria at [facility] today.  3. Anemia of chronic kidney disease: hemoglobin 8.7 - Avoid Epogen given recent diagnosis of breast cancer. - blood transfusion given this admission - continue to monitor CBC, she will require periodic blood transfusion.  4. Secondary hyperparathyroidism. - phosphorus 4.8 at last check and Renvela still remains on hold.  Recheck phosphorus today.     LOS: 12 Raia Amico 12/20/201712:04 PM

## 2015-12-27 NOTE — Discharge Instructions (Signed)
Continue hemodialysis on Monday, Wednesday and Friday Nephrology to check PT/INR on 12/29/2015 and titrate Coumadin dose as needed Follow-up with the nephrology Dr. Candiss Norse on Friday Follow-up with surgery Dr. Callie Fielding as scheduled on January 16 Follow-up with [facility] cardiology in 1-2 weeks

## 2015-12-27 NOTE — Progress Notes (Signed)
Pt request to end dialysis tx 30 minutes early. Blood rinsed back.

## 2015-12-27 NOTE — Progress Notes (Addendum)
ANTICOAGULATION CONSULT NOTE - Follow Up Consult  Pharmacy Consult for warfarin  Indication: hx of VTE   Allergies  Allergen Reactions  . Adhesive [Tape] Itching    Silk tape is ok to use.    Patient Measurements: Height: 5\' 6"  (167.6 cm) Weight: 131 lb 13.4 oz (59.8 kg) IBW/kg (Calculated) : 59.3 Heparin Dosing Weight:   Vital Signs: Temp: 98.3 F (36.8 C) (12/20 0956) Temp Source: Oral (12/20 0956) BP: 128/102 (12/20 1005) Pulse Rate: 107 (12/20 1005)  Labs:  Recent Labs  12/25/15 0520 12/26/15 0501 12/26/15 1516 12/26/15 2026 12/27/15 0120 12/27/15 0512 12/27/15 0850  HGB 8.7* 9.2*  --   --   --  8.4*  --   HCT 26.0* 27.4*  --   --   --  25.5*  --   PLT 199 206  --   --   --  218  --   LABPROT 21.3* 19.3*  --   --   --  21.0*  --   INR 1.82 1.61  --   --   --  1.79  --   HEPARINUNFRC  --   --   --   --  0.56  --  0.61  CREATININE 4.79* 4.37*  --   --   --   --   --   TROPONINI  --   --  0.18* 0.17*  --  0.15*  --     Estimated Creatinine Clearance: 12.7 mL/min (by C-G formula based on SCr of 4.37 mg/dL (H)).   Assessment:  Pharmacy consulted for heparin drip management for 60 yo female transferred to ICU due to flash pulmonary edema and requiring emergent intubation. Pharmacy previously consulted for warfarin management; patient is currently subtherapeutic. Patient has history significant for DVT x 2 and required warfarin as an outpatient. Patient initiated on heparin drip went transitioned to ICU. Heparin drip was held due to therapeutic INR. Port removal today 12/13. INR now subtherapeutic at 1.61.  Pharmacy consulted for heparin drip dosing and monitoring for bridge to therapeutic INR due to PMH of DVT.   Patients home dose:  M,T,W, Th :7.5mg                                      F, Sa, Sun: 5mg     DATE:          INR:            DOSE  12/13            1.61        7.5mg  12/14          1.33      7.5mg   12/15           1.69             7.5mg        12/16              2.23             5 mg   12/17          2.00       Held  12/18             1.82             Held  12/19            1.61  10 mg 12/20             1.78            10 mg   Goal of Therapy:  Anti-Xa 0.3-0.7  INR: 2-3 Monitor platelets by anticoagulation protocol: Yes   Plan:   12/20 0900 HL therapeutic x2. Continue current rate. INR subtherapeutic; will give 7.5mg  x1 today. Will check INR/CBC/HL tomorrow AM.   Pharmacy will continue to monitor and adjust per consult.    Loree Fee, PharmD 12/27/2015 10:08 AM  ADD: Patient's INR still below goal. Per MD, likely to go home today; therefore will give an additional 2.5 mg to = 10 mg of warfarin for today.   Larene Beach, PharmD

## 2015-12-27 NOTE — Care Management Important Message (Signed)
Important Message  Patient Details  Name: Teresa Cook MRN: [id number] Date of Birth: [birth year]   Medicare Important Message Given:  Yes    Shelbie Ammons, RN 12/27/2015, 11:54 AM

## 2015-12-27 NOTE — Progress Notes (Signed)
Pre hd info 

## 2015-12-27 NOTE — Progress Notes (Signed)
Pharmacy Antibiotic Note  Teresa Cook is a 60 y.o. female admitted on 12/15/2015 with sepsis, being treated for line infection. Pharmacy has been consulted for vancomycin and cefepime dosing.  Plan: 12/20 Vanc level Pre-HD: 29 (Goal: 15-25 mcg/mL). Patient did not fully complete HD session on 12/18. Patient received 2 hours of dialysis. This calculates to a trough of 59mcg/ml. Will continue with Vancomycin 750mg  to be given at the conclusion of each dialysis session. Vancomycin stop date 12/23.   Height: 5\' 6"  (167.6 cm) Weight: 129 lb 9 oz (58.8 kg) IBW/kg (Calculated) : 59.3  Temp (24hrs), Avg:98.1 F (36.7 C), Min:97.7 F (36.5 C), Max:98.5 F (36.9 C)   Recent Labs Lab 12/21/15 0607 12/22/15 0603 12/24/15 0507 12/25/15 0520 12/26/15 0501 12/27/15 0512  WBC 3.8 4.8 7.2 9.2 10.0 12.0*  CREATININE 3.41* 5.51*  --  4.79* 4.37*  --   VANCORANDOM  --  23  --   --   --  29    Estimated Creatinine Clearance: 12.5 mL/min (by C-G formula based on SCr of 4.37 mg/dL (H)).    Allergies  Allergen Reactions  . Adhesive [Tape] Itching    Silk tape is ok to use.    Antimicrobials this admission: Cefepime 12/8 >> 12/14 Vancomycin 12/8 >>   Dose adjustments this admission: N/A  Microbiology results: 12/10 BCx: no growth < 24 hours 12/9 Cath Tip Cx: no growth x 2 days  12/8 BCx: no growth x 3  12/8 Urine: no growth  12/8 Sputum: specifmen not acceptable for testing  12/8 MRSA PCR: negative  12/7 BCx Outpt: corynebacterium   Pharmacy will continue to monitor and adjust per consult.   Loree Fee, PharmD 12/27/2015 7:58 AM

## 2015-12-27 NOTE — Progress Notes (Signed)
[facility] [place] NAME: Teresa Cook    MR#:  [id number]  DATE OF BIRTH:  05/06/1955  SUBJECTIVE:  CHIEF COMPLAINT:  Pt had HD tody, nauseous, unsteady withPT  REVIEW OF SYSTEMS:  CONSTITUTIONAL: No fever, fatigue or weakness.  EYES: No blurred or double vision.  EARS, NOSE, AND THROAT: No tinnitus or ear pain.  RESPIRATORY: No cough, shortness of breath, wheezing or hemoptysis.  CARDIOVASCULAR: No chest pain, orthopnea, edema.  GASTROINTESTINAL: nausea, DENIES vomiting, diarrhea or abdominal pain.  GENITOURINARY: No dysuria, hematuria.  ENDOCRINE: No polyuria, nocturia,  HEMATOLOGY: No anemia, easy bruising or bleeding SKIN: No rash or lesion. MUSCULOSKELETAL: No joint pain or arthritis.   NEUROLOGIC: No tingling, numbness, weakness.  PSYCHIATRY: No anxiety or depression.   DRUG ALLERGIES:   Allergies  Allergen Reactions  . Adhesive [Tape] Itching    Silk tape is ok to use.    VITALS:  Blood pressure 96/69, pulse 91, temperature 98.2 F (36.8 C), temperature source Oral, resp. rate 16, height 5\' 6"  (1.676 m), weight 59 kg (130 lb 1.1 oz), SpO2 99 %.  PHYSICAL EXAMINATION:  GENERAL:  60 y.o.-year-old patient lying in the bed with no acute distress.  EYES: Pupils equal, round, reactive to light and accommodation. No scleral icterus. Extraocular muscles intact.  HEENT: Head atraumatic, normocephalic. Oropharynx and nasopharynx clear.  NECK:  Supple, no jugular venous distention. No thyroid enlargement, no tenderness.  LUNGS: Normal breath sounds bilaterally, no wheezing, rales,rhonchi or crepitation. No use of accessory muscles of respiration.  CARDIOVASCULAR: S1, S2 normal. No murmurs, rubs, or gallops.  ABDOMEN: Soft, nontender, nondistended. Bowel sounds present. No organomegaly or mass.  EXTREMITIES: No pedal edema, cyanosis, or clubbing.  NEUROLOGIC: Cranial nerves II through XII are intact. Muscle strength 5/5  in all extremities. Sensation intact. Gait not checked.  PSYCHIATRIC: The patient is alert and oriented x 3.  SKIN: No obvious rash, lesion, or ulcer.    LABORATORY PANEL:   CBC  Recent Labs Lab 12/27/15 0512  WBC 12.0*  HGB 8.4*  HCT 25.5*  PLT 218   ------------------------------------------------------------------------------------------------------------------  Chemistries   Recent Labs Lab 12/22/15 0603  12/26/15 0501  NA 140  < > 135  K 4.2  < > 4.2  CL 106  < > 97*  CO2 25  < > 28  GLUCOSE 93  < > 89  BUN 26*  < > 23*  CREATININE 5.51*  < > 4.37*  CALCIUM 9.3  < > 10.3  MG 2.1  --   --   < > = values in this interval not displayed. ------------------------------------------------------------------------------------------------------------------  Cardiac Enzymes  Recent Labs Lab 12/27/15 0512  TROPONINI 0.15*   ------------------------------------------------------------------------------------------------------------------  RADIOLOGY:  No results found.  EKG:   Orders placed or performed during the hospital encounter of 12/15/15  . ED EKG  . ED EKG  . EKG 12-Lead  . EKG 12-Lead  . EKG 12-Lead  . EKG 12-Lead  . ED EKG  . ED EKG    ASSESSMENT AND PLAN:    #.Acute Respiratory failure secondary to combination of fluid overload, multifocal pneumonia. Status post extubation . Clinically improving continue oxygen 2 L via nasal cannula, weaned off to roomair Patient is transferred to floor from the [facility] 12/20/2015  #. sepsis: Secondary to bacteremia  Blood cultures from 12 /9 from HD center diphtheroids. Continue vancomycin until 12/30/2015. Discontinued cefepime on 12/21/2015. procalcitonin level 89.  Cultures from 12/10 no  growth, his permacath removal, per catheter tip cultures from December 9 didn't show any growth. - Dr. Heath Lark has recommended new permacath placement on the right side of the chest only patient had a new permacath  placement in the right groin area12/18/17. Patient wants to talk to vascular surgery regarding the procedures, notified to vascular surgery Refusing fistula versus graft but wants to talk to vascular surgery for details  # Sinus tachycardia Metoprolol started Appreciate [place] recommendations  cycle troponins-0.18-0.1 7.-0.15  #.Anemia of chronic kidney disease. Hemoglobin dropped yesterday 12/20/2015, received one unit transfusion. hemoglobin 7.2 -7.1 -8.3 --8.7-9.2--8.4 Monitor closely. On Coumadin  #.history of breast cancer finished chemotherapy. Port is removed for sepsis Outpatient follow-up with Dr. Callie Fielding for resection  # essential hypertension-blood pressure is stable  #history of left leg DVT: Because of anemia stopped Warfarin for a few days during the hospital course which is resumed at this time as no active bleeding. Transmitted 1 unit of packed RBC, hemoglobin at 7.1 Coumadin INR at 1.79 started heparin drip for bridging  No active bleeding Today patient will receive 7.5 mg of Coumadin prior to discharge and will continue her home dose. Repeat INR on Friday during dialysis and nephrologist to monitor and adjust the Coumadin dose  # end-stage renal disease continue hemodialysis on Monday Wednesday Friday-[facility] nephrology Patient has temporary right femoral dialysis catheter at this time which will be removed prior to discharge Patient had new right groin permacath placementon 12/25/2015. Patient is planning to change to Dr. Keturah Barre service   #History of breast cancer left upper quadrant of the breast adenocarcinoma completed neoadjuvant chemotherapy with Taxotere and Cytoxan on October 26, 2015   # PT eval- pt unsteady, reeval in am per RN report   Gen. surgery, Nephrology, ID, pulmonary, oncology, cardiology followed the patient. Physical therapy consult.    All the records are reviewed and case discussed with Care Management/Social  Workerr. Management plans discussed with the patient, family and they are in agreement.  CODE STATUS: FC  TOTAL TIME TAKING CARE OF THIS PATIENT: 66minutes.   POSSIBLE D/C IN 2  DAYS, DEPENDING ON CLINICAL CONDITION.  Note: This dictation was prepared with Dragon dictation along with smaller phrase technology. Any transcriptional errors that result from this process are unintentional.   [name] M.D on 12/27/2015 at 4:43 PM  Between 7am to 6pm - Pager - [phone] After 6pm go to [email or web address] - password [email or web address] [facility]  Office  [phone]  CC: Primary care physician; Murlean Iba, MD

## 2015-12-27 NOTE — Progress Notes (Signed)
Nutrition Follow-up  DOCUMENTATION CODES:   Not applicable  INTERVENTION:  Recommend discontinuing Ensure Enlive and [employer] as patient is on HD with elevated phosphorus.  Recommend phosphate binder with meals.   Provide Nepro Shake po BID, each supplement provides 425 kcal and 19 grams protein.  NUTRITION DIAGNOSIS:   Increased nutrient needs related to catabolic illness, cancer and cancer related treatments as evidenced by estimated needs.  Ongoing.  GOAL:   Patient will meet greater than or equal to 90% of their needs  Progressing.  MONITOR:   PO intake, Supplement acceptance, Labs, Weight trends, I & O's  REASON FOR ASSESSMENT:   Malnutrition Screening Tool    ASSESSMENT:   60 yo female admitted 12/08 with sepsis with positive blood cultures from dialysis catheter ; s/p rapid response this AM (12/10) with respiratory distress due to flash pulmonary edema, transferred to the unit and intubated. Pt with hx of ESRD on HD, CHF, DVT, HTN breast cancer (finished chemo in 10/2015 with surgery planned for 01/2016)  Spoke with patient during HD session. She reports her appetite is still "so-so" and that she has been skipping meals. She ate 100% of lunch yesterday, but then skipped dinner. She has now missed breakfast due to being in HD and she is hungry now. Reports she had 1 Ensure Enlive yesterday. She does not really enjoy Ensure or Magic Cup but reports she is doing her best with them to get enough protein.  Meal Completion: 0-100% per chart. Patient has had 1177 kcal (70% minimum estimated kcal needs) and 62 grams of protein (83% minimum estimated protein needs) in the past 24 hrs.   Medications reviewed and include: pantoprazole, vancomycin, warfarin.  Labs reviewed: Chloride 97, BUN 23, Creatinine 4.37, Phosphorus 4.8 on 12/18. Potassium WNL today.  Diet Order:  Diet heart healthy/carb modified Room service appropriate? Yes; Fluid consistency: Thin  Skin:   Reviewed, no issues  Last BM:  12/26/2015  Height:   Ht Readings from Last 1 Encounters:  12/15/15 5\' 6"  (1.676 m)    Weight:   Wt Readings from Last 1 Encounters:  12/27/15 131 lb 13.4 oz (59.8 kg)    Ideal Body Weight:  59.1 kg  BMI:  Body mass index is 21.28 kg/m.  Estimated Nutritional Needs:   Kcal:  1680-1870 (27-30 kcal/kg)  Protein:  75-93 (1.2-1.5 grams/kg)  Fluid:  1000 mL plus UOP  EDUCATION NEEDS:   Education needs addressed  Willey Blade, MS, RD, LDN Pager: [phone] After Hours Pager: [phone]

## 2015-12-28 ENCOUNTER — Ambulatory Visit: Payer: Self-pay | Admitting: Surgery

## 2015-12-28 ENCOUNTER — Ambulatory Visit: Payer: Medicare Other | Admitting: Surgery

## 2015-12-28 LAB — PROTIME-INR
INR: 1.85
INR: 2.01
PROTHROMBIN TIME: 23.1 s — AB (ref 11.4–15.2)
Prothrombin Time: 21.6 seconds — ABNORMAL HIGH (ref 11.4–15.2)

## 2015-12-28 LAB — CBC
HCT: 24.8 % — ABNORMAL LOW (ref 35.0–47.0)
Hemoglobin: 8.2 g/dL — ABNORMAL LOW (ref 12.0–16.0)
MCH: 27.9 pg (ref 26.0–34.0)
MCHC: 33.2 g/dL (ref 32.0–36.0)
MCV: 84.2 fL (ref 80.0–100.0)
PLATELETS: 227 10*3/uL (ref 150–440)
RBC: 2.95 MIL/uL — AB (ref 3.80–5.20)
RDW: 18 % — AB (ref 11.5–14.5)
WBC: 11.6 10*3/uL — AB (ref 3.6–11.0)

## 2015-12-28 LAB — PHOSPHORUS: PHOSPHORUS: 5.3 mg/dL — AB (ref 2.5–4.6)

## 2015-12-28 LAB — HEPARIN LEVEL (UNFRACTIONATED): HEPARIN UNFRACTIONATED: 0.48 [IU]/mL (ref 0.30–0.70)

## 2015-12-28 MED ORDER — NEPRO/CARBSTEADY PO LIQD: 237.0000 mL | Freq: Two times a day (BID) | ORAL | 0 refills | Status: DC

## 2015-12-28 MED ORDER — WARFARIN SODIUM 7.5 MG PO TABS: 7.5000 mg | ORAL_TABLET | Freq: Once | ORAL | Status: DC

## 2015-12-28 NOTE — Progress Notes (Signed)
[facility] Cardiology  SUBJECTIVE: I don't have chest pain   Vitals:   12/27/15 1548 12/27/15 2013 12/28/15 0459 12/28/15 0514  BP: 96/69 107/64  94/67  Pulse: 91 (!) 54  100  Resp: 16 20  20   Temp: 98.2 F (36.8 C) 98.7 F (37.1 C)  98.3 F (36.8 C)  TempSrc: Oral Oral  Oral  SpO2: 99% 100%  99%  Weight:   58.8 kg (129 lb 10.1 oz)   Height:         Intake/Output Summary (Last 24 hours) at 12/28/15 0827 Last data filed at 12/27/15 2338  Gross per 24 hour  Intake              120 ml  Output              822 ml  Net             -702 ml      PHYSICAL EXAM  General: Well developed, well nourished, in no acute distress HEENT:  Normocephalic and atramatic Neck:  No JVD.  Lungs: Clear bilaterally to auscultation and percussion. Heart: HRRR . Normal S1 and S2 without gallops or murmurs.  Abdomen: Bowel sounds are positive, abdomen soft and non-tender  Msk:  Back normal, normal gait. Normal strength and tone for age. Extremities: No clubbing, cyanosis or edema.   Neuro: Alert and oriented X 3. Psych:  Good affect, responds appropriately   LABS: Basic Metabolic Panel:  Recent Labs  12/25/15 2030 12/26/15 0501 12/28/15 0443  NA  --  135  --   K  --  4.2  --   CL  --  97*  --   CO2  --  28  --   GLUCOSE  --  89  --   BUN  --  23*  --   CREATININE  --  4.37*  --   CALCIUM  --  10.3  --   PHOS 4.8*  --  5.3*   Liver Function Tests: No results for input(s): AST, ALT, ALKPHOS, BILITOT, PROT, ALBUMIN in the last 72 hours. No results for input(s): LIPASE, AMYLASE in the last 72 hours. CBC:  Recent Labs  12/27/15 0512 12/28/15 0443  WBC 12.0* 11.6*  HGB 8.4* 8.2*  HCT 25.5* 24.8*  MCV 84.5 84.2  PLT 218 227   Cardiac Enzymes:  Recent Labs  12/26/15 1516 12/26/15 2026 12/27/15 0512  TROPONINI 0.18* 0.17* 0.15*   BNP: Invalid input(s): POCBNP D-Dimer: No results for input(s): DDIMER in the last 72 hours. Hemoglobin A1C: No results for input(s): HGBA1C in  the last 72 hours. Fasting Lipid Panel: No results for input(s): CHOL, HDL, LDLCALC, TRIG, CHOLHDL, LDLDIRECT in the last 72 hours. Thyroid Function Tests: No results for input(s): TSH, T4TOTAL, T3FREE, THYROIDAB in the last 72 hours.  Invalid input(s): FREET3 Anemia Panel: No results for input(s): VITAMINB12, FOLATE, FERRITIN, TIBC, IRON, RETICCTPCT in the last 72 hours.  No results found.   Echo mildly reduced left ventricular function, with LVEF 40-45% by echocardiogram 07/11/2015  TELEMETRY: Sinus tachycardia:  ASSESSMENT AND PLAN:  Active Problems:   Sepsis ([place])   Healthcare-associated pneumonia   Bloodstream infection due to port-a-cath, initial encounter    1. Sinus tachycardia, compensatory for pneumonia, sepsis, anemia, end-stage renal disease on chronic hemodialysis, recently well controlled on metoprolol 2. Borderline elevated troponin, and the absence of chest pain, likely demand supply ischemia 3. Sepsis/pneumonia 4. Chronic anemia  Recommendations  1. Agree with current therapy  2. Continue metoprolol for now 3. Defer further cardiac diagnostics at this time  Signed off for now, please call if any questions   Isaias Cowman, MD, PhD, [facility] 12/28/2015 8:27 AM

## 2015-12-28 NOTE — Progress Notes (Signed)
Pharmacy Antibiotic Note  Teresa Cook is a 60 y.o. female admitted on 12/15/2015 with sepsis, being treated for line infection. Pharmacy has been consulted for vancomycin and cefepime dosing.  Plan: 12/20 Vanc level Pre-HD: 29 (Goal: 15-25 mcg/mL). Patient did not fully complete HD session on 12/18. Patient received 2 hours of dialysis. This calculates to a trough of 44mcg/ml. Will continue with Vancomycin 750mg  to be given at the conclusion of each dialysis session. Vancomycin stop date 12/23.   Height: 5\' 6"  (167.6 cm) Weight: 129 lb 10.1 oz (58.8 kg) IBW/kg (Calculated) : 59.3  Temp (24hrs), Avg:98.4 F (36.9 C), Min:98.2 F (36.8 C), Max:98.7 F (37.1 C)   Recent Labs Lab 12/22/15 0603 12/24/15 0507 12/25/15 0520 12/26/15 0501 12/27/15 0512 12/28/15 0443  WBC 4.8 7.2 9.2 10.0 12.0* 11.6*  CREATININE 5.51*  --  4.79* 4.37*  --   --   VANCORANDOM 23  --   --   --  29  --     Estimated Creatinine Clearance: 12.5 mL/min (by C-G formula based on SCr of 4.37 mg/dL (H)).    Allergies  Allergen Reactions  . Adhesive [Tape] Itching    Silk tape is ok to use.    Antimicrobials this admission: Cefepime 12/8 >> 12/14 Vancomycin 12/8 >>   Dose adjustments this admission: N/A  Microbiology results: 12/10 BCx: no growth < 24 hours 12/9 Cath Tip Cx: no growth x 2 days  12/8 BCx: no growth x 3  12/8 Urine: no growth  12/8 Sputum: specifmen not acceptable for testing  12/8 MRSA PCR: negative  12/7 BCx Outpt: corynebacterium   Pharmacy will continue to monitor and adjust per consult.   Loree Fee, PharmD 12/28/2015 7:29 AM

## 2015-12-28 NOTE — Progress Notes (Signed)
Discharge instructions along with home medications and follow up gone over with patient and husband. Both verbalize that they understood instructions. Prescriptions given to patient. PICC line and tele removed. Pt being discharged home on room air with hemodialysis catheter in R groin, no distress noted. Ammie Dalton, RN

## 2015-12-28 NOTE — Progress Notes (Signed)
ANTICOAGULATION CONSULT NOTE - Follow Up Consult  Pharmacy Consult for HEPARIN   Indication: hx of VTE   Allergies  Allergen Reactions  . Adhesive [Tape] Itching    Silk tape is ok to use.    Patient Measurements: Height: 5\' 6"  (167.6 cm) Weight: 129 lb 10.1 oz (58.8 kg) IBW/kg (Calculated) : 59.3 Heparin Dosing Weight:   Vital Signs: Temp: 98.3 F (36.8 C) (12/21 0514) Temp Source: Oral (12/21 0514) BP: 94/67 (12/21 0514) Pulse Rate: 100 (12/21 0514)  Labs:  Recent Labs  12/26/15 0501 12/26/15 1516 12/26/15 2026 12/27/15 0120 12/27/15 0512 12/27/15 0850 12/28/15 0443  HGB 9.2*  --   --   --  8.4*  --   --   HCT 27.4*  --   --   --  25.5*  --   --   PLT 206  --   --   --  218  --   --   LABPROT 19.3*  --   --   --  21.0*  --  21.6*  INR 1.61  --   --   --  1.79  --  1.85  HEPARINUNFRC  --   --   --  0.56  --  0.61 0.48  CREATININE 4.37*  --   --   --   --   --   --   TROPONINI  --  0.18* 0.17*  --  0.15*  --   --     Estimated Creatinine Clearance: 12.5 mL/min (by C-G formula based on SCr of 4.37 mg/dL (H)).   Assessment:  Pharmacy consulted for heparin drip management for 60 yo female transferred to ICU due to flash pulmonary edema and requiring emergent intubation. Pharmacy previously consulted for warfarin management; patient is currently subtherapeutic. Patient has history significant for DVT x 2 and required warfarin as an outpatient. Patient initiated on heparin drip went transitioned to ICU. Heparin drip was held due to therapeutic INR. Port removal today 12/13. INR now subtherapeutic at 1.61.  Pharmacy consulted for heparin drip dosing and monitoring for bridge to therapeutic INR due to PMH of DVT.   Patients home dose:  M,T,W, Th :7.5mg                                      F, Sa, Sun: 5mg     DATE:          INR:            DOSE  12/13            1.61        7.5mg  12/14          1.33      7.5mg   12/15           1.69             7.5mg        12/16              2.23             5 mg   12/17          2.00       Held  12/18             1.82             Held  12/19  1.61   Goal of Therapy:  Anti-Xa 0.3-0.7  INR: 2-3 Monitor platelets by anticoagulation protocol: Yes   Plan:  Heparin 900units/hr. No bolus. Will check Anti-Xa in 6 hours.   Will start patient on home dose of warfarin 7.5mg  M,T,W,Th and 5mg  F,Sat,Sun.   INRs ordered daily. Heparin drip to be stopped after INR therapeutic.   12/13:  HL @ 22:45 = 0.21 .   Will order heparin 900 units IV X 1 bolus and increase drip rate to 1100 units/hr.  Will recheck HL 8 hrs after rate change.   12/14 HL: 0.43 . Will continue heparin drip at 1100 units/hr. Will recheck heparin level in 6 hours for confirmation. INR subtherapeutic at 1.33. Will continue with patients home dose of 7.5mg  today. Expect INR to start trending up in next 48 hours.  12/14 HL: 0.32. This is the second level with in therapeutic range. Will continue heparin drip at 1100 units/hr. HL and CBC ordered with AM labs.   12/15 HL @ 0600 = 0.4,  INR = 1.69.    Will continue this pt on current drip rate and recheck INR and HL on 12/16 with AM labs. INR being to increase, still subtherapeutic. Will give the patient an extra 2.5mg  tonight on top of her regular scheduled 5mg  tablet.   12/16 @ 06:00= 0.50; INR= 2.23. Will continue current heparin gtt rate. Will recheck heparin level in am. Will give warfarin 5 mg PO x 1 dose today (home dose). Will recheck PT/INR in am.   12/17: INR dropped to 2.0.Will give warfarin 7.5 mg PO x 1.  Will recheck PT/INR with am labs.   12/19: INR 1.61- patient has been off of warfarin for 2 days due to permacath placement. Will reinitiate heparin and warfarin today.  Heparin bolus 3700 units x1 Heparin drip 1050 units/hr. Warfarin 10mg  x1 tonight.  Will check HL in 8hrs and INR and CBC tomorrow morning.   12/20 0120 HL therapeutic x 1. Continue current rate. Will recheck HL in 8  hours.  12/21 0443 HL therapeutic. Continue current rate. Will continue to follow daily HL and CBC.   Pharmacy will continue to monitor and adjust per consult.    Laural Benes, PharmD, BCPS 12/28/2015 6:16 AM

## 2015-12-28 NOTE — Progress Notes (Signed)
Central line removed from left groin. Patient tolerated well, no distress noted. Ammie Dalton, RN

## 2015-12-28 NOTE — Progress Notes (Signed)
ANTICOAGULATION CONSULT NOTE - Follow Up Consult  Pharmacy Consult for HEPARIN   Indication: hx of VTE   Allergies  Allergen Reactions  . Adhesive [Tape] Itching    Silk tape is ok to use.    Patient Measurements: Height: 5\' 6"  (167.6 cm) Weight: 129 lb 10.1 oz (58.8 kg) IBW/kg (Calculated) : 59.3 Heparin Dosing Weight:   Vital Signs: Temp: 98.3 F (36.8 C) (12/21 0514) Temp Source: Oral (12/21 0514) BP: 94/67 (12/21 0514) Pulse Rate: 100 (12/21 0514)  Labs:  Recent Labs  12/26/15 0501 12/26/15 1516 12/26/15 2026 12/27/15 0120 12/27/15 0512 12/27/15 0850 12/28/15 0443  HGB 9.2*  --   --   --  8.4*  --  8.2*  HCT 27.4*  --   --   --  25.5*  --  24.8*  PLT 206  --   --   --  218  --  227  LABPROT 19.3*  --   --   --  21.0*  --  21.6*  INR 1.61  --   --   --  1.79  --  1.85  HEPARINUNFRC  --   --   --  0.56  --  0.61 0.48  CREATININE 4.37*  --   --   --   --   --   --   TROPONINI  --  0.18* 0.17*  --  0.15*  --   --     Estimated Creatinine Clearance: 12.5 mL/min (by C-G formula based on SCr of 4.37 mg/dL (H)).   Assessment:  Pharmacy consulted for heparin drip management for 60 yo female transferred to ICU due to flash pulmonary edema and requiring emergent intubation. Pharmacy previously consulted for warfarin management; patient is currently subtherapeutic. Patient has history significant for DVT x 2 and required warfarin as an outpatient. Patient initiated on heparin drip went transitioned to ICU. Heparin drip was held due to therapeutic INR. Port removal today 12/13. INR now subtherapeutic at 1.61.  Pharmacy consulted for heparin drip dosing and monitoring for bridge to therapeutic INR due to PMH of DVT.   Patients home dose:  M,T,W, Th :7.5mg                                      F, Sa, Sun: 5mg     DATE:          INR:            DOSE  12/13            1.61        7.5mg  12/14          1.33      7.5mg   12/15           1.69             7.5mg        12/16              2.23             5 mg   12/17          2.00       Held  12/18             1.82             Held  12/19            1.61   Goal  of Therapy:  Anti-Xa 0.3-0.7  INR: 2-3 Monitor platelets by anticoagulation protocol: Yes   Plan:  Heparin 900units/hr. No bolus. Will check Anti-Xa in 6 hours.   Will start patient on home dose of warfarin 7.5mg  M,T,W,Th and 5mg  F,Sat,Sun.   INRs ordered daily. Heparin drip to be stopped after INR therapeutic.   12/13:  HL @ 22:45 = 0.21 .   Will order heparin 900 units IV X 1 bolus and increase drip rate to 1100 units/hr.  Will recheck HL 8 hrs after rate change.   12/14 HL: 0.43 . Will continue heparin drip at 1100 units/hr. Will recheck heparin level in 6 hours for confirmation. INR subtherapeutic at 1.33. Will continue with patients home dose of 7.5mg  today. Expect INR to start trending up in next 48 hours.  12/14 HL: 0.32. This is the second level with in therapeutic range. Will continue heparin drip at 1100 units/hr. HL and CBC ordered with AM labs.   12/15 HL @ 0600 = 0.4,  INR = 1.69.    Will continue this pt on current drip rate and recheck INR and HL on 12/16 with AM labs. INR being to increase, still subtherapeutic. Will give the patient an extra 2.5mg  tonight on top of her regular scheduled 5mg  tablet.   12/16 @ 06:00= 0.50; INR= 2.23. Will continue current heparin gtt rate. Will recheck heparin level in am. Will give warfarin 5 mg PO x 1 dose today (home dose). Will recheck PT/INR in am.   12/17: INR dropped to 2.0.Will give warfarin 7.5 mg PO x 1.  Will recheck PT/INR with am labs.   12/19: INR 1.61- patient has been off of warfarin for 2 days due to permacath placement. Will reinitiate heparin and warfarin today.  Heparin bolus 3700 units x1 Heparin drip 1050 units/hr. Warfarin 10mg  x1 tonight.  Will check HL in 8hrs and INR and CBC tomorrow morning.   12/20 0120 HL therapeutic x 1. Continue current rate. Will recheck HL in 8  hours.  12/21 0443 HL therapeutic. Continue current rate. Will continue to follow daily HL and CBC.   12/21 0500 INR 1.85. Will continue home dose of warfarin.   Pharmacy will continue to monitor and adjust per consult.    Loree Fee, PharmD 12/28/2015 7:21 AM

## 2015-12-28 NOTE — Care Management (Signed)
Physical therapy evaluation completed. Recommending home with home health and physical therapy. Discussed home health agencies with Teresa Cook at the bedside. [place]. Will update Teresa Cook, [facility] representative.  Teresa Cook has been an established dialysis patient x 4 years at [facility] on [address]. States her husband will transport. Heparin Drip. INR 1.85 Shelbie Ammons RN MSN CCM Care Management

## 2015-12-31 ENCOUNTER — Emergency Department: Payer: Medicare Other

## 2015-12-31 ENCOUNTER — Inpatient Hospital Stay
Admission: EM | Admit: 2015-12-31 | Discharge: 2016-01-03 | DRG: 100 | Disposition: A | Discharge status: Home Health | Payer: Medicare Other | Attending: Internal Medicine | Admitting: Internal Medicine

## 2015-12-31 ENCOUNTER — Encounter: Payer: Self-pay | Admitting: Emergency Medicine

## 2015-12-31 DIAGNOSIS — I4891 Unspecified atrial fibrillation: Secondary | ICD-10-CM | POA: Diagnosis present

## 2015-12-31 DIAGNOSIS — Z803 Family history of malignant neoplasm of breast: Secondary | ICD-10-CM

## 2015-12-31 DIAGNOSIS — Z823 Family history of stroke: Secondary | ICD-10-CM

## 2015-12-31 DIAGNOSIS — I509 Heart failure, unspecified: Secondary | ICD-10-CM | POA: Diagnosis present

## 2015-12-31 DIAGNOSIS — Z8249 Family history of ischemic heart disease and other diseases of the circulatory system: Secondary | ICD-10-CM

## 2015-12-31 DIAGNOSIS — D649 Anemia, unspecified: Secondary | ICD-10-CM | POA: Diagnosis present

## 2015-12-31 DIAGNOSIS — N186 End stage renal disease: Secondary | ICD-10-CM | POA: Diagnosis present

## 2015-12-31 DIAGNOSIS — F419 Anxiety disorder, unspecified: Secondary | ICD-10-CM | POA: Diagnosis present

## 2015-12-31 DIAGNOSIS — M109 Gout, unspecified: Secondary | ICD-10-CM | POA: Diagnosis present

## 2015-12-31 DIAGNOSIS — D571 Sickle-cell disease without crisis: Secondary | ICD-10-CM | POA: Diagnosis present

## 2015-12-31 DIAGNOSIS — G4089 Other seizures: Secondary | ICD-10-CM | POA: Diagnosis not present

## 2015-12-31 DIAGNOSIS — Z923 Personal history of irradiation: Secondary | ICD-10-CM

## 2015-12-31 DIAGNOSIS — Z992 Dependence on renal dialysis: Secondary | ICD-10-CM

## 2015-12-31 DIAGNOSIS — Z7901 Long term (current) use of anticoagulants: Secondary | ICD-10-CM

## 2015-12-31 DIAGNOSIS — Z833 Family history of diabetes mellitus: Secondary | ICD-10-CM

## 2015-12-31 DIAGNOSIS — Z853 Personal history of malignant neoplasm of breast: Secondary | ICD-10-CM

## 2015-12-31 DIAGNOSIS — R569 Unspecified convulsions: Secondary | ICD-10-CM

## 2015-12-31 DIAGNOSIS — Z170 Estrogen receptor positive status [ER+]: Secondary | ICD-10-CM

## 2015-12-31 DIAGNOSIS — Z79899 Other long term (current) drug therapy: Secondary | ICD-10-CM

## 2015-12-31 DIAGNOSIS — Z7982 Long term (current) use of aspirin: Secondary | ICD-10-CM

## 2015-12-31 DIAGNOSIS — Z91048 Other nonmedicinal substance allergy status: Secondary | ICD-10-CM

## 2015-12-31 DIAGNOSIS — M6281 Muscle weakness (generalized): Secondary | ICD-10-CM

## 2015-12-31 DIAGNOSIS — D329 Benign neoplasm of meninges, unspecified: Secondary | ICD-10-CM | POA: Diagnosis present

## 2015-12-31 DIAGNOSIS — E876 Hypokalemia: Secondary | ICD-10-CM | POA: Diagnosis present

## 2015-12-31 DIAGNOSIS — D631 Anemia in chronic kidney disease: Secondary | ICD-10-CM | POA: Diagnosis present

## 2015-12-31 DIAGNOSIS — N2581 Secondary hyperparathyroidism of renal origin: Secondary | ICD-10-CM | POA: Diagnosis present

## 2015-12-31 DIAGNOSIS — Z90710 Acquired absence of both cervix and uterus: Secondary | ICD-10-CM

## 2015-12-31 DIAGNOSIS — W19XXXA Unspecified fall, initial encounter: Secondary | ICD-10-CM | POA: Diagnosis present

## 2015-12-31 DIAGNOSIS — R262 Difficulty in walking, not elsewhere classified: Secondary | ICD-10-CM

## 2015-12-31 DIAGNOSIS — Z86718 Personal history of other venous thrombosis and embolism: Secondary | ICD-10-CM

## 2015-12-31 DIAGNOSIS — Z9221 Personal history of antineoplastic chemotherapy: Secondary | ICD-10-CM

## 2015-12-31 DIAGNOSIS — L409 Psoriasis, unspecified: Secondary | ICD-10-CM | POA: Diagnosis present

## 2015-12-31 DIAGNOSIS — I132 Hypertensive heart and chronic kidney disease with heart failure and with stage 5 chronic kidney disease, or end stage renal disease: Secondary | ICD-10-CM | POA: Diagnosis present

## 2015-12-31 DIAGNOSIS — F329 Major depressive disorder, single episode, unspecified: Secondary | ICD-10-CM | POA: Diagnosis present

## 2015-12-31 LAB — COMPREHENSIVE METABOLIC PANEL
ALK PHOS: 189 U/L — AB (ref 38–126)
ALT: 15 U/L (ref 14–54)
ANION GAP: 12 (ref 5–15)
AST: 29 U/L (ref 15–41)
Albumin: 3.2 g/dL — ABNORMAL LOW (ref 3.5–5.0)
BILIRUBIN TOTAL: 0.6 mg/dL (ref 0.3–1.2)
BUN: 9 mg/dL (ref 6–20)
CALCIUM: 9.5 mg/dL (ref 8.9–10.3)
CO2: 24 mmol/L (ref 22–32)
Chloride: 100 mmol/L — ABNORMAL LOW (ref 101–111)
Creatinine, Ser: 2.62 mg/dL — ABNORMAL HIGH (ref 0.44–1.00)
GFR, EST AFRICAN AMERICAN: 22 mL/min — AB (ref >60)
GFR, EST NON AFRICAN AMERICAN: 19 mL/min — AB (ref >60)
Glucose, Bld: 103 mg/dL — ABNORMAL HIGH (ref 65–99)
POTASSIUM: 3.5 mmol/L (ref 3.5–5.1)
Sodium: 136 mmol/L (ref 135–145)
TOTAL PROTEIN: 6.9 g/dL (ref 6.5–8.1)

## 2015-12-31 LAB — CBC
HEMATOCRIT: 23.7 % — AB (ref 35.0–47.0)
Hemoglobin: 8 g/dL — ABNORMAL LOW (ref 12.0–16.0)
MCH: 28.9 pg (ref 26.0–34.0)
MCHC: 33.7 g/dL (ref 32.0–36.0)
MCV: 85.9 fL (ref 80.0–100.0)
Platelets: 221 10*3/uL (ref 150–440)
RBC: 2.76 MIL/uL — ABNORMAL LOW (ref 3.80–5.20)
RDW: 18.9 % — AB (ref 11.5–14.5)
WBC: 10.8 10*3/uL (ref 3.6–11.0)

## 2015-12-31 LAB — PROTIME-INR
INR: 1.99
Prothrombin Time: 22.9 seconds — ABNORMAL HIGH (ref 11.4–15.2)

## 2015-12-31 LAB — AMMONIA: AMMONIA: 9 umol/L (ref 9–35)

## 2015-12-31 LAB — MAGNESIUM: Magnesium: 1.8 mg/dL (ref 1.7–2.4)

## 2015-12-31 LAB — TROPONIN I: TROPONIN I: 0.04 ng/mL — AB (ref <0.03)

## 2015-12-31 MED ORDER — DIPHENHYDRAMINE HCL 25 MG PO CAPS: 25.0000 mg | ORAL_CAPSULE | Freq: Three times a day (TID) | ORAL | Status: DC | PRN

## 2015-12-31 MED ORDER — SODIUM CHLORIDE 0.9% FLUSH
3.0000 mL | Freq: Two times a day (BID) | INTRAVENOUS | Status: DC
  Administered 2015-12-31 – 2016-01-03 (×5): 3 mL via INTRAVENOUS

## 2015-12-31 MED ORDER — ALPRAZOLAM 0.5 MG PO TABS: 0.5000 mg | ORAL_TABLET | Freq: Two times a day (BID) | ORAL | Status: DC | PRN

## 2015-12-31 MED ORDER — BENZONATATE 100 MG PO CAPS
200.0000 mg | ORAL_CAPSULE | Freq: Three times a day (TID) | ORAL | Status: DC | PRN
Start: 2015-12-31 — End: 2016-01-03

## 2015-12-31 MED ORDER — LEVETIRACETAM 500 MG PO TABS
500.0000 mg | ORAL_TABLET | Freq: Once | ORAL | Status: AC
  Administered 2015-12-31: 500 mg via ORAL
  Filled 2015-12-31: qty 1

## 2015-12-31 MED ORDER — WARFARIN - PHARMACIST DOSING INPATIENT
Freq: Every day | Status: DC
  Administered 2016-01-01 – 2016-01-02 (×2)

## 2015-12-31 MED ORDER — TRAMADOL HCL 50 MG PO TABS
50.0000 mg | ORAL_TABLET | Freq: Four times a day (QID) | ORAL | Status: DC | PRN
  Administered 2015-12-31 – 2016-01-01 (×2): 50 mg via ORAL
  Filled 2015-12-31 (×2): qty 1

## 2015-12-31 MED ORDER — LOSARTAN POTASSIUM 50 MG PO TABS
100.0000 mg | ORAL_TABLET | ORAL | Status: DC
  Administered 2016-01-02: 100 mg via ORAL
  Filled 2015-12-31: qty 2

## 2015-12-31 MED ORDER — METOPROLOL TARTRATE 50 MG PO TABS
50.0000 mg | ORAL_TABLET | ORAL | Status: DC
  Administered 2016-01-02 (×2): 50 mg via ORAL
  Filled 2015-12-31 (×2): qty 1

## 2015-12-31 MED ORDER — LORAZEPAM 1 MG PO TABS: 1.0000 mg | ORAL_TABLET | Freq: Once | ORAL | Status: DC

## 2015-12-31 MED ORDER — LORAZEPAM 2 MG/ML IJ SOLN
1.0000 mg | Freq: Once | INTRAMUSCULAR | Status: AC
  Administered 2015-12-31: 1 mg via INTRAVENOUS
  Filled 2015-12-31: qty 1

## 2015-12-31 MED ORDER — PANTOPRAZOLE SODIUM 40 MG PO TBEC
40.0000 mg | DELAYED_RELEASE_TABLET | Freq: Two times a day (BID) | ORAL | Status: DC
  Administered 2016-01-01 – 2016-01-03 (×6): 40 mg via ORAL
  Filled 2015-12-31 (×6): qty 1

## 2015-12-31 MED ORDER — SERTRALINE HCL 100 MG PO TABS
100.0000 mg | ORAL_TABLET | Freq: Every day | ORAL | Status: DC
Start: 2015-12-31 — End: 2016-01-03
  Administered 2015-12-31 – 2016-01-03 (×4): 100 mg via ORAL
  Filled 2015-12-31 (×4): qty 1

## 2015-12-31 MED ORDER — LEVETIRACETAM 500 MG PO TABS
500.0000 mg | ORAL_TABLET | Freq: Two times a day (BID) | ORAL | Status: DC
  Administered 2015-12-31 – 2016-01-01 (×2): 500 mg via ORAL
  Filled 2015-12-31 (×2): qty 1

## 2015-12-31 MED ORDER — ACETAMINOPHEN 325 MG PO TABS
650.0000 mg | ORAL_TABLET | Freq: Four times a day (QID) | ORAL | Status: DC | PRN
  Administered 2016-01-01 – 2016-01-03 (×2): 650 mg via ORAL
  Filled 2015-12-31 (×2): qty 2

## 2015-12-31 MED ORDER — SENNOSIDES-DOCUSATE SODIUM 8.6-50 MG PO TABS: 1.0000 | ORAL_TABLET | Freq: Every evening | ORAL | Status: DC | PRN

## 2015-12-31 MED ORDER — LEVETIRACETAM 500 MG PO TABS: 500.0000 mg | ORAL_TABLET | Freq: Two times a day (BID) | ORAL | 0 refills | Status: DC

## 2015-12-31 MED ORDER — ENSURE ENLIVE PO LIQD
237.0000 mL | Freq: Three times a day (TID) | ORAL | Status: DC
  Administered 2016-01-01 – 2016-01-02 (×3): 237 mL via ORAL

## 2015-12-31 MED ORDER — HEPARIN SODIUM (PORCINE) 5000 UNIT/ML IJ SOLN
5000.0000 [IU] | Freq: Three times a day (TID) | INTRAMUSCULAR | Status: DC
  Administered 2015-12-31 – 2016-01-03 (×8): 5000 [IU] via SUBCUTANEOUS
  Filled 2015-12-31 (×8): qty 1

## 2015-12-31 MED ORDER — ASPIRIN EC 81 MG PO TBEC
81.0000 mg | DELAYED_RELEASE_TABLET | Freq: Every day | ORAL | Status: DC
  Administered 2015-12-31 – 2016-01-03 (×4): 81 mg via ORAL
  Filled 2015-12-31 (×4): qty 1

## 2015-12-31 MED ORDER — WARFARIN SODIUM 5 MG PO TABS
5.0000 mg | ORAL_TABLET | Freq: Once | ORAL | Status: AC
  Administered 2015-12-31: 5 mg via ORAL
  Filled 2015-12-31: qty 1

## 2015-12-31 NOTE — ED Notes (Addendum)
Pt speech somewhat unclear at times, tremors noted, per pt this has been going on all day. ED provider at bedside .

## 2015-12-31 NOTE — Progress Notes (Signed)
ANTICOAGULATION CONSULT NOTE - Initial Consult  Pharmacy Consult for Warfarin Indication: continuation of therapy for DVT  Allergies  Allergen Reactions  . Adhesive [Tape] Itching    Silk tape is ok to use.    Patient Measurements:   Heparin Dosing Weight:    Vital Signs: Temp: 98.6 F (37 C) (12/24 1629) Temp Source: Oral (12/24 1629) BP: 149/93 (12/24 2057) Pulse Rate: 91 (12/24 1915)  Labs:  Recent Labs  12/31/15 1634  HGB 8.0*  HCT 23.7*  PLT 221  LABPROT 22.9*  INR 1.99  CREATININE 2.62*  TROPONINI 0.04*    Estimated Creatinine Clearance: 20.9 mL/min (by C-G formula based on SCr of 2.62 mg/dL (H)).   Medical History: Past Medical History:  Diagnosis Date  . Anemia   . Anxiety   . Breast cancer ([place])   . CHF (congestive heart failure) ([place])   . Chronic kidney disease   . Depression   . Dialysis patient ([place])   . DVT (deep venous thrombosis) ([facility])    left leg  . DVT (deep venous thrombosis) ([place])   . Gout   . HTN (hypertension)   . Hypertension   . Parathyroid abnormality ([place])   . Parathyroid disease ([place])   . Psoriasis   . Renal insufficiency   . Sickle cell anemia ([facility])    traits     Assessment: Pharmacy consulted to dose Warfarin for continuation of therapy. Patient was taking 5mg  on Friday, Saturday, and Sunday and 7.5mg  Monday through Thursday.  Goal of Therapy:  INR 2-3 Monitor platelets by anticoagulation protocol: Yes   Plan:  Will give Warfarin 5mg  this evening and assess AM INR for further dosing.  Paulina Fusi, PharmD, BCPS 12/31/2015 9:20 PM

## 2015-12-31 NOTE — Care Management Obs Status (Signed)
[facility] NOTIFICATION   Patient Details  Name: Teresa Cook MRN: [id number] Date of Birth: 1955/11/20   Medicare Observation Status Notification Given:  Yes    Beau Fanny, RN 12/31/2015, 8:50 PM

## 2015-12-31 NOTE — ED Notes (Signed)
Pt unable to ambulate well, 2 assist to get into chair. RN asked pt is she has help at home, pt uneasy about going home now. Family at bedside states pt may be safer to stay here. MD notified

## 2015-12-31 NOTE — ED Notes (Signed)
Patient transported to X-ray 

## 2015-12-31 NOTE — ED Provider Notes (Addendum)
[facility] Emergency Department Provider Note  Time seen: 4:29 PM  I have reviewed the triage vital signs and the nursing notes.   HISTORY  Chief Complaint Seizures    HPI Teresa Cook is a 60 y.o. female with a past medical history of end-stage renal disease on hemodialysis, hypertension, CHF, anxiety, who presents to the emergency department after a possible seizure. According to the patient she has been feeling "twitchy" all day today which she states is somewhat normal for her. She states she had a fall today and hit the back of her head early this morning. Patient was able to go to dialysis and did her full dialysis session. She states when she got home she does not recall what happened. EMS was called to the house and the family reports what appears to be seizure activity with generalized tonic-clonic movements and confusion/postictal period following the seizure. Patient has no history of seizures in the past.Patient doesn't history of breast cancer status post chemotherapy and radiation therapy but states that is in remission. Currently the patient appears well, is alert and oriented, denies any complaints at this time.  Past Medical History:  Diagnosis Date  . Anemia   . Anxiety   . Breast cancer ([place])   . CHF (congestive heart failure) ([place])   . Chronic kidney disease   . Depression   . Dialysis patient ([place])   . DVT (deep venous thrombosis) (H[facility])    left leg  . DVT (deep venous thrombosis) ([place])   . Gout   . HTN (hypertension)   . Hypertension   . Parathyroid abnormality ([place])   . Parathyroid disease ([place])   . Psoriasis   . Renal insufficiency   . Sickle cell anemia (H[facility])    traits    Patient Active Problem List   Diagnosis Date Noted  . Healthcare-associated pneumonia   . Bloodstream infection due to port-a-cath, initial encounter   . Sepsis ([place]) 12/15/2015  . Chest pain 08/31/2015  . SIRS (systemic inflammatory response syndrome)  ([place]) 08/27/2015  . Malignant neoplasm of upper-inner quadrant of left breast in female, estrogen receptor positive ([place]) 08/05/2015  . Pulmonary edema 07/09/2015  . Dizziness 03/03/2015  . Seizures ([place]) 03/03/2015  . Spells 11/11/2014  . Acute respiratory failure with hypoxia ([place]) 09/19/2014  . Left ventricular dysfunction 09/06/2014  . Acute pulmonary edema ([place]) 08/30/2014  . Respiratory failure ([place]) 08/29/2014  . Respiratory difficulty 08/29/2014  . Chronic systolic heart failure ([place]) 05/16/2014  . Hypertension 05/16/2014  . Renal failure 05/16/2014  . Anxiety 05/16/2014  . Sickle cell trait ([place]) 03/03/2014  . ESRD on dialysis ([place]) 11/02/2013  . Dysgeusia 01/27/2013  . Weight loss 01/27/2013  . Resting tremor 08/13/2012  . Depression 04/30/2012  . 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 Right leg DVT ([place]) 04/30/2012    Past Surgical History:  Procedure Laterality Date  . ABDOMINAL HYSTERECTOMY    . APPENDECTOMY    . BREAST BIOPSY Left 10/28/2013   benign  . BREAST EXCISIONAL BIOPSY Left 2002   benign  . INSERTION OF DIALYSIS CATHETER  2014  . PARTIAL HYSTERECTOMY    . PERIPHERAL VASCULAR CATHETERIZATION N/A 12/25/2015   Procedure: Dialysis/Perma Catheter Insertion;  Surgeon: Algernon Huxley, MD;  Location: [place];  Service: Cardiovascular;  Laterality: N/A;  . PORT-A-CATH REMOVAL N/A 12/20/2015   Procedure: REMOVAL PORT-A-CATH;  Surgeon: Hubbard Robinson, MD;  Location: [facility];  Service: General;  Laterality: N/A;  left    . PORTACATH PLACEMENT Left 08/21/2015   Procedure: INSERTION PORT-A-CATH;  Surgeon: Hubbard Robinson, MD;  Location: [facility];  Service: General;  Laterality: Left;    Prior to Admission medications   Medication Sig Start Date End Date Taking? Authorizing Provider  acetaminophen (TYLENOL) 325 MG tablet Take 2 tablets (650 mg total) by mouth every 6 (six) hours as needed for mild pain (or Fever >/= 101).  12/27/15   [name], MD  ALPRAZolam Duanne Moron) 0.5 MG tablet Take 1 tablet (0.5 mg total) by mouth 2 (two) times daily as needed for anxiety. 12/27/15   [name], MD  aspirin EC 81 MG EC tablet Take 1 tablet (81 mg total) by mouth daily. 12/27/15   [name], MD  benzonatate (TESSALON) 200 MG capsule Take 200 mg by mouth 3 (three) times daily as needed for cough.    Historical Provider, MD  diphenhydrAMINE (BENADRYL) 25 mg capsule Take 1 capsule (25 mg total) by mouth every 8 (eight) hours as needed for itching. 12/27/15   [name], MD  feeding supplement, ENSURE ENLIVE, (ENSURE ENLIVE) LIQD Take 237 mLs by mouth 3 (three) times daily between meals. 12/27/15   [name], MD  losartan (COZAAR) 50 MG tablet Take 100 mg by mouth daily. Takes on non-dialysis days=Tuesday, Thursday, Saturday and Sunday. 09/07/14   Historical Provider, MD  metoprolol (LOPRESSOR) 100 MG tablet Take 0.5 tablets (50 mg total) by mouth 2 (two) times daily. Takes on non-dialysis days=Tuesday, Thursday, Saturday and Sunday. 12/27/15   [name], MD  Nutritional Supplements (FEEDING SUPPLEMENT, NEPRO CARB STEADY,) LIQD Take 237 mLs by mouth 2 (two) times daily between meals. 12/28/15   [name], MD  pantoprazole (PROTONIX) 40 MG tablet Take 1 tablet (40 mg total) by mouth 2 (two) times daily before a meal. 08/31/15   Srikar Sudini, MD  senna-docusate (SENOKOT-S) 8.6-50 MG tablet Take 1 tablet by mouth at bedtime as needed for mild constipation. 12/27/15   [name], MD  sertraline (ZOLOFT) 100 MG tablet Take 100 mg by mouth daily.  03/05/12   Historical Provider, MD  traMADol (ULTRAM) 50 MG tablet Take 1 tablet (50 mg total) by mouth every 6 (six) hours as needed for moderate pain. 12/27/15   [name], MD  warfarin (COUMADIN) 5 MG tablet Take 1 tablet Friday, Saturday and Sundays. Take 1 1/2 tablets the other days of the week 11/03/13   Historical Provider, MD  zolpidem (AMBIEN) 10 MG tablet Take 10 mg by mouth at  bedtime as needed for sleep.    Historical Provider, MD    Allergies  Allergen Reactions  . Adhesive [Tape] Itching    Silk tape is ok to use.    Family History  Problem Relation Age of Onset  . Stroke Mother   . CVA Mother   . Hypertension Mother   . Hypertension Father   . Hypertension Sister   . Diabetes Sister   . Cancer Sister 30    Breast  . Stroke Brother   . Hypertension Brother   . Breast cancer Maternal Aunt 70    Social History Social History  Substance Use Topics  . Smoking status: Never Smoker  . Smokeless tobacco: Never Used  . Alcohol use No    Review of Systems Constitutional: Negative for fever. Cardiovascular: Negative for chest pain. Respiratory: Negative for shortness of breath. Gastrointestinal: Negative for abdominal pain Genitourinary: Negative for dysuria. Neurological: Negative for headache 10-point ROS otherwise negative.  ____________________________________________   PHYSICAL EXAM:  VITAL SIGNS: ED Triage Vitals  Enc Vitals Group     BP 12/31/15 1628 (!) 175/96     Pulse Rate 12/31/15 1626 98     Resp 12/31/15 1626 18     Temp --      Temp Source 12/31/15 1626 Oral     SpO2 12/31/15 1626 98 %     Weight --      Height --      Head Circumference --      Peak Flow --      Pain Score 12/31/15 1627 0     Pain Loc --      Pain Edu? --      Excl. in [place]? --     Constitutional: Alert and oriented. Well appearing and in no distress. Eyes: Normal exam ENT   Head: Normocephalic and atraumatic   Mouth/Throat: Mucous membranes are moist. Cardiovascular: Normal rate, regular rhythm. No murmur Respiratory: Normal respiratory effort without tachypnea nor retractions. Breath sounds are clear Gastrointestinal: Soft and nontender. No distention.   Musculoskeletal: Nontender with normal range of motion in all extremities.  Neurologic:  Normal speech and language. No gross focal neurologic deficits  Skin:  Skin is warm, dry and  intact.  Psychiatric: Mood and affect are normal.  ____________________________________________    EKG  EKG appears to show atrial flutter around 104 bpm, narrow QRS, normal axis, nonspecific ST changes. Record review appear to show EKGs of similar appearance in the past along with EKGs showing normal sinus rhythm likely indicating paroxysmal atrial fibrillation.  ____________________________________________    RADIOLOGY  CT shows extra-axial calcification largely unchanged. X-ray of the foot is negative  ____________________________________________   INITIAL IMPRESSION / ASSESSMENT AND PLAN / ED COURSE  Pertinent labs & imaging results that were available during my care of the patient were reviewed by me and considered in my medical decision making (see chart for details).  The patient presents the emergency department after a possible seizure. Currently awaiting family arrival to describe further what they witnessed reviewed we will check labs, obtain CT scan of the head given the patient's fall this morning. We will closely monitor in the emergency department. Currently the patient appears well, no distress is alert and oriented. Patient does have right foot pain since the fall this morning in mild occipital headache.  I reviewed the patient's records it appears the patient has just been discharged from the hospital 3 days ago after a prolonged stay approximately 2 weeks in the hospital for sepsis thought to be due to a central line infection. Patient was discharged home 12/28/15. It appears that the patient is taking Coumadin. We'll obtain a CT scan of the head, check labs including INR. Currently the patient is afebrile with largely normal vitals heart rate varies between 90-110 bpm  Patient has been seen by the telemetry neurologist who believes the patient is suffering from myoclonic jerks. Ammonia and magnesium have been added on to the workup which have resulted normal. He also  suggested that the patient could be using excessive amounts of Xanax and after dialysis going into acute withdrawal's. Patient was dosed Ativan in the emergency department with near complete resolution of her myoclonic jerks. Neurology would like to start the patient on Keppra 500 mg twice a day as a precaution. We will have the patient follow up with neurology in the office. We will dose Keppra in the emergency department and discharged with the same. Patient  states she has Xanax at home which she uses every night, and denies using in the day.  Intended to discharge the patient, she required assistance to get into the wheelchair, was unable to get from the wheelchair to her car even with assistance. Patient still with significant myoclonic jerks. Daughter states known as they're to care for the patient she already had one fall this morning due to the myoclonic jerks. Given the patient's unsteadiness without any help at home I believe the patient would benefit from admission to the hospital for further workup and monitoring.  ____________________________________________   FINAL CLINICAL IMPRESSION(S) / ED DIAGNOSES  Seizure    Harvest Dark, MD 12/31/15 1933    Harvest Dark, MD 12/31/15 [id number]    Harvest Dark, MD 12/31/15 2006

## 2015-12-31 NOTE — ED Triage Notes (Signed)
Pt to ED via EMS from home, c/o witnessed seizure after dialysis today.No hx of seizure Pt states she has been having tremors all day. Per EMS pt was postictal and RA o2 80s%. Pt A&Ox4

## 2015-12-31 NOTE — H&P (Signed)
[facility] [place] NAME: Teresa Cook    MR#:  [id number]  DATE OF BIRTH:  April 26, 1955  DATE OF ADMISSION:  12/31/2015  PRIMARY CARE PHYSICIAN: Murlean Iba, MD   REQUESTING/REFERRING PHYSICIAN: Paduchowski  CHIEF COMPLAINT:   Chief Complaint  Patient presents with  . Seizures    HISTORY OF PRESENT ILLNESS: Teresa Cook  is a 60 y.o. female with a known history of ESRD, CHF, Depression, DVT, Htn, Was recently admitted in hospital with bacteremia and she had her permacath and med port taken out, and she was on antibiotics. Today morning she had some twitching movements in both her arms and she could not hold things with that, still was taken to her dialysis center by her husband where she was finished full course of dialysis but continued to have same twitching movements. She was taken home by her daughter in the car but could not get up without much help.  Her daughter noticed patient suddenly passed out and she had tonic-clonic movements on all her limbs with eyes rolled up. She was passed out and having a seizure-like activity for 30-40 seconds.  After stopping the seizures activities for few minutes she was very confused. So daughters called ambulance and she was brought to hospital.  In ER telemetry neurologic consult was done, he suggested to start Keppra twice a day.  CT scan of the head showed some calcifications which were new.  PAST MEDICAL HISTORY:   Past Medical History:  Diagnosis Date  . Anemia   . Anxiety   . Breast cancer ([place])   . CHF (congestive heart failure) ([place])   . Chronic kidney disease   . Depression   . Dialysis patient ([place])   . DVT (deep venous thrombosis) ([facility])    left leg  . DVT (deep venous thrombosis) ([place])   . Gout   . HTN (hypertension)   . Hypertension   . Parathyroid abnormality ([place])   . Parathyroid disease ([place])   . Psoriasis   . Renal insufficiency   . Sickle cell anemia (H[facility])     traits    PAST SURGICAL HISTORY: Past Surgical History:  Procedure Laterality Date  . ABDOMINAL HYSTERECTOMY    . APPENDECTOMY    . BREAST BIOPSY Left 10/28/2013   benign  . BREAST EXCISIONAL BIOPSY Left 2002   benign  . INSERTION OF DIALYSIS CATHETER  2014  . PARTIAL HYSTERECTOMY    . PERIPHERAL VASCULAR CATHETERIZATION N/A 12/25/2015   Procedure: Dialysis/Perma Catheter Insertion;  Surgeon: Algernon Huxley, MD;  Location: [facility];  Service: Cardiovascular;  Laterality: N/A;  . PORT-A-CATH REMOVAL N/A 12/20/2015   Procedure: REMOVAL PORT-A-CATH;  Surgeon: Hubbard Robinson, MD;  Location: [facility];  Service: General;  Laterality: N/A;  left    . PORTACATH PLACEMENT Left 08/21/2015   Procedure: INSERTION PORT-A-CATH;  Surgeon: Hubbard Robinson, MD;  Location: [facility];  Service: General;  Laterality: Left;    SOCIAL HISTORY:  Social History  Substance Use Topics  . Smoking status: Never Smoker  . Smokeless tobacco: Never Used  . Alcohol use No    FAMILY HISTORY:  Family History  Problem Relation Age of Onset  . Stroke Mother   . CVA Mother   . Hypertension Mother   . Hypertension Father   . Hypertension Sister   . Diabetes Sister   . Cancer Sister 3    Breast  . Stroke Brother   .  Hypertension Brother   . Breast cancer Maternal Aunt 70    DRUG ALLERGIES:  Allergies  Allergen Reactions  . Adhesive [Tape] Itching    Silk tape is ok to use.    REVIEW OF SYSTEMS:   CONSTITUTIONAL: No fever, fatigue or weakness.  EYES: No blurred or double vision.  EARS, NOSE, AND THROAT: No tinnitus or ear pain.  RESPIRATORY: No cough, shortness of breath, wheezing or hemoptysis.  CARDIOVASCULAR: No chest pain, orthopnea, edema.  GASTROINTESTINAL: No nausea, vomiting, diarrhea or abdominal pain.  GENITOURINARY: No dysuria, hematuria.  ENDOCRINE: No polyuria, nocturia,  HEMATOLOGY: No anemia, easy bruising or bleeding SKIN: No rash or lesion. MUSCULOSKELETAL: No  joint pain or arthritis.   NEUROLOGIC: No tingling, numbness, weakness.  PSYCHIATRY: No anxiety or depression.   MEDICATIONS AT HOME:  Prior to Admission medications   Medication Sig Start Date End Date Taking? Authorizing Provider  acetaminophen (TYLENOL) 325 MG tablet Take 2 tablets (650 mg total) by mouth every 6 (six) hours as needed for mild pain (or Fever >/= 101). 12/27/15   [name], MD  ALPRAZolam Duanne Moron) 0.5 MG tablet Take 1 tablet (0.5 mg total) by mouth 2 (two) times daily as needed for anxiety. 12/27/15   [name], MD  aspirin EC 81 MG EC tablet Take 1 tablet (81 mg total) by mouth daily. 12/27/15   [name], MD  benzonatate (TESSALON) 200 MG capsule Take 200 mg by mouth 3 (three) times daily as needed for cough.    Historical Provider, MD  diphenhydrAMINE (BENADRYL) 25 mg capsule Take 1 capsule (25 mg total) by mouth every 8 (eight) hours as needed for itching. 12/27/15   [name], MD  feeding supplement, ENSURE ENLIVE, (ENSURE ENLIVE) LIQD Take 237 mLs by mouth 3 (three) times daily between meals. 12/27/15   [name], MD  levETIRAcetam (KEPPRA) 500 MG tablet Take 1 tablet (500 mg total) by mouth 2 (two) times daily. 12/31/15   Harvest Dark, MD  losartan (COZAAR) 50 MG tablet Take 100 mg by mouth daily. Takes on non-dialysis days=Tuesday, Thursday, Saturday and Sunday. 09/07/14   Historical Provider, MD  metoprolol (LOPRESSOR) 100 MG tablet Take 0.5 tablets (50 mg total) by mouth 2 (two) times daily. Takes on non-dialysis days=Tuesday, Thursday, Saturday and Sunday. 12/27/15   [name], MD  Nutritional Supplements (FEEDING SUPPLEMENT, NEPRO CARB STEADY,) LIQD Take 237 mLs by mouth 2 (two) times daily between meals. 12/28/15   [name], MD  pantoprazole (PROTONIX) 40 MG tablet Take 1 tablet (40 mg total) by mouth 2 (two) times daily before a meal. 08/31/15   Srikar Sudini, MD  senna-docusate (SENOKOT-S) 8.6-50 MG tablet Take 1 tablet by mouth at bedtime as  needed for mild constipation. 12/27/15   [name], MD  sertraline (ZOLOFT) 100 MG tablet Take 100 mg by mouth daily.  03/05/12   Historical Provider, MD  traMADol (ULTRAM) 50 MG tablet Take 1 tablet (50 mg total) by mouth every 6 (six) hours as needed for moderate pain. 12/27/15   [name], MD  warfarin (COUMADIN) 5 MG tablet Take 1 tablet Friday, Saturday and Sundays. Take 1 1/2 tablets the other days of the week 11/03/13   Historical Provider, MD  zolpidem (AMBIEN) 10 MG tablet Take 10 mg by mouth at bedtime as needed for sleep.    Historical Provider, MD      PHYSICAL EXAMINATION:   VITAL SIGNS: Blood pressure (!) 160/85, pulse 91, temperature 98.6 F (37 C), temperature source  Oral, resp. rate 16, SpO2 97 %.  GENERAL:  60 y.o.-year-old patient lying in the bed with no acute distress.  EYES: Pupils equal, round, reactive to light and accommodation. No scleral icterus. Extraocular muscles intact.  HEENT: Head atraumatic, normocephalic. Oropharynx and nasopharynx clear.  NECK:  Supple, no jugular venous distention. No thyroid enlargement, no tenderness.  LUNGS: Normal breath sounds bilaterally, no wheezing, rales,rhonchi or crepitation. No use of accessory muscles of respiration.  CARDIOVASCULAR: S1, S2 normal. No murmurs, rubs, or gallops.  ABDOMEN: Soft, nontender, nondistended. Bowel sounds present. No organomegaly or mass.  EXTREMITIES: No pedal edema, cyanosis, or clubbing. On the right thigh there is a dialysis catheter present. NEUROLOGIC: Cranial nerves II through XII are intact. Muscle strength 5/5 in all extremities. Sensation intact. Gait not checked. She has some jerky movements in both her upper extremities. PSYCHIATRIC: The patient is alert and oriented x 3.  SKIN: No obvious rash, lesion, or ulcer.   LABORATORY PANEL:   CBC  Recent Labs Lab 12/25/15 0520 12/26/15 0501 12/27/15 0512 12/28/15 0443 12/31/15 1634  WBC 9.2 10.0 12.0* 11.6* 10.8  HGB 8.7* 9.2* 8.4*  8.2* 8.0*  HCT 26.0* 27.4* 25.5* 24.8* 23.7*  PLT 199 206 218 227 221  MCV 83.9 84.8 84.5 84.2 85.9  MCH 28.2 28.4 27.9 27.9 28.9  MCHC 33.6 33.5 33.0 33.2 33.7  RDW 17.6* 17.6* 17.5* 18.0* 18.9*   ------------------------------------------------------------------------------------------------------------------  Chemistries   Recent Labs Lab 12/25/15 0520 12/26/15 0501 12/31/15 1634  NA 136 135 136  K 4.1 4.2 3.5  CL 97* 97* 100*  CO2 28 28 24   GLUCOSE 94 89 103*  BUN 27* 23* 9  CREATININE 4.79* 4.37* 2.62*  CALCIUM 10.0 10.3 9.5  MG  --   --  1.8  AST  --   --  29  ALT  --   --  15  ALKPHOS  --   --  189*  BILITOT  --   --  0.6   ------------------------------------------------------------------------------------------------------------------ estimated creatinine clearance is 20.9 mL/min (by C-G formula based on SCr of 2.62 mg/dL (H)). ------------------------------------------------------------------------------------------------------------------ No results for input(s): TSH, T4TOTAL, T3FREE, THYROIDAB in the last 72 hours.  Invalid input(s): FREET3   Coagulation profile  Recent Labs Lab 12/26/15 0501 12/27/15 0512 12/28/15 0443 12/28/15 1241 12/31/15 1634  INR 1.61 1.79 1.85 2.01 1.99   ------------------------------------------------------------------------------------------------------------------- No results for input(s): DDIMER in the last 72 hours. -------------------------------------------------------------------------------------------------------------------  Cardiac Enzymes  Recent Labs Lab 12/26/15 2026 12/27/15 0512 12/31/15 1634  TROPONINI 0.17* 0.15* 0.04*   ------------------------------------------------------------------------------------------------------------------ Invalid input(s): POCBNP  ---------------------------------------------------------------------------------------------------------------  Urinalysis     Component Value Date/Time   COLORURINE STRAW (A) 12/15/2015 2042   APPEARANCEUR CLEAR (A) 12/15/2015 2042   APPEARANCEUR Clear 09/08/2013 0133   LABSPEC 1.005 12/15/2015 2042   LABSPEC 1.013 09/08/2013 0133   PHURINE 9.0 (H) 12/15/2015 2042   GLUCOSEU 50 (A) 12/15/2015 2042   GLUCOSEU 50 mg/dL 09/08/2013 0133   HGBUR NEGATIVE 12/15/2015 2042   BILIRUBINUR NEGATIVE 12/15/2015 2042   BILIRUBINUR Negative 09/08/2013 0133   KETONESUR NEGATIVE 12/15/2015 2042   PROTEINUR 100 (A) 12/15/2015 2042   NITRITE NEGATIVE 12/15/2015 2042   LEUKOCYTESUR NEGATIVE 12/15/2015 2042   LEUKOCYTESUR Negative 09/08/2013 0133     RADIOLOGY: Ct Head Wo Contrast  Result Date: 12/31/2015 CLINICAL DATA:  Seizure after dialysis. EXAM: CT HEAD WITHOUT CONTRAST TECHNIQUE: Contiguous axial images were obtained from the base of the skull through the vertex without intravenous contrast. COMPARISON:  02/02/2014  FINDINGS: Brain: No evidence of acute infarction, hemorrhage, hydrocephalus, or extra-axial collection. 1.5 x 1.5 cm partially calcified extra-axial mass seen along the right tentorium, abutting the right cerebellar cortex. The mass is not well visualized, however appears increased from the prior CT dated 02/02/2014, when it measured 1.4 x 1.1 cm. Vascular: No hyperdense vessels. Calcific atherosclerotic disease at the skullbase. Skull: Negative for fracture or focal lesion. Hyperdense appearance of the osseous structures, likely related to chronic renal disease. Sinuses/Orbits: Polypoid mucosal thickening of the right maxillary sinus and right sphenoid sinus. Minimal polypoid mucosal thickening of bilateral ethmoid sinuses. Partial opacification of the bilateral mastoid air cells. Other: None IMPRESSION: No evident of acute infarction or hemorrhage. Apparent increase in the size of known partially calcified extra-axial mass along the right tentorium. Nonemergent evaluation with contrast-enhanced brain MRI may provide  better characterisation of this lesion. Electronically Signed   By: Fidela Salisbury M.D.   On: 12/31/2015 17:24   Dg Foot Complete Right  Result Date: 12/31/2015 CLINICAL DATA:  Status post fall.  Unable to bear weight. EXAM: RIGHT FOOT COMPLETE - 3+ VIEW COMPARISON:  None. FINDINGS: Moderate to severe osteopenia. No evidence of displaced fractures seen radiographically. Mild soft tissue swelling. IMPRESSION: No evidence of displaced fractures seen radiographically. However fractures could be easily overlooked in the settings of osteopenia, and therefore if the patient continues to be symptomatic CT of the foot may be considered. Electronically Signed   By: Fidela Salisbury M.D.   On: 12/31/2015 17:14    EKG: Orders placed or performed during the hospital encounter of 12/15/15  . ED EKG  . ED EKG  . EKG 12-Lead  . EKG 12-Lead  . EKG 12-Lead  . EKG 12-Lead  . ED EKG  . ED EKG  . EKG    IMPRESSION AND PLAN:  * Seizures   Oral Keppra twice a day.   MRI on the brain.   Neurologic consult.  * End-stage renal disease on hemodialysis    Nephrology consult.  * Recent bacteremia   Check Blood culture, she was suppose to finish Abx yesterday as per last Progress notes in last admission.  * DVT  On Coumadin- Adjust per pharmacy.  * Hypertension   Metoprolol, Losartan.  * Depression   Zoloft.  All the records are reviewed and case discussed with ED provider. Management plans discussed with the patient, family and they are in agreement.  CODE STATUS: Full code. Code Status History    Date Active Date Inactive Code Status Order ID Comments User Context   12/15/2015 10:40 PM 12/28/2015  6:52 PM Full Code [id number]  Harvie Bridge, DO Inpatient   08/31/2015  4:26 AM 09/02/2015  2:35 PM Full Code [id number]  Harrie Foreman, MD Inpatient   08/27/2015  4:06 AM 08/29/2015  3:57 PM Full Code [id number]  Saundra Shelling, MD ED   07/09/2015  2:44 PM 07/12/2015  7:55 PM Full Code  [id number]  Laverle Hobby, MD ED   09/19/2014 11:47 AM 09/20/2014  6:25 PM Full Code [id number]  Dustin Flock, MD ED   08/29/2014  2:28 PM 08/30/2014  5:43 PM Full Code [id number]  Fritzi Mandes, MD Inpatient    Advance Directive Documentation   Flowsheet Row Most Recent Value  Type of Advance Directive  Healthcare Power of Attorney, Living will  Pre-existing out of facility DNR order (yellow form or pink MOST form)  No data  "MOST" Form in Place?  No data     Daughter  was present ion room.  TOTAL TIME TAKING CARE OF THIS PATIENT: 50 minutes.    [name] M.D on 12/31/2015   Between 7am to 6pm - Pager - [phone]  After 6pm go to [email or web address] - password [email or web address]  Office  [phone]  CC: Primary care physician; Murlean Iba, MD   Note: This dictation was prepared with Dragon dictation along with smaller phrase technology. Any transcriptional errors that result from this process are unintentional.

## 2016-01-01 ENCOUNTER — Inpatient Hospital Stay: Payer: Medicare Other

## 2016-01-01 DIAGNOSIS — G4089 Other seizures: Secondary | ICD-10-CM | POA: Diagnosis present

## 2016-01-01 DIAGNOSIS — W19XXXA Unspecified fall, initial encounter: Secondary | ICD-10-CM | POA: Diagnosis present

## 2016-01-01 DIAGNOSIS — D329 Benign neoplasm of meninges, unspecified: Secondary | ICD-10-CM | POA: Diagnosis present

## 2016-01-01 DIAGNOSIS — I509 Heart failure, unspecified: Secondary | ICD-10-CM | POA: Diagnosis present

## 2016-01-01 DIAGNOSIS — Z823 Family history of stroke: Secondary | ICD-10-CM | POA: Diagnosis not present

## 2016-01-01 DIAGNOSIS — N2581 Secondary hyperparathyroidism of renal origin: Secondary | ICD-10-CM | POA: Diagnosis present

## 2016-01-01 DIAGNOSIS — R569 Unspecified convulsions: Secondary | ICD-10-CM

## 2016-01-01 DIAGNOSIS — M109 Gout, unspecified: Secondary | ICD-10-CM | POA: Diagnosis present

## 2016-01-01 DIAGNOSIS — Z7982 Long term (current) use of aspirin: Secondary | ICD-10-CM | POA: Diagnosis not present

## 2016-01-01 DIAGNOSIS — I132 Hypertensive heart and chronic kidney disease with heart failure and with stage 5 chronic kidney disease, or end stage renal disease: Secondary | ICD-10-CM | POA: Diagnosis present

## 2016-01-01 DIAGNOSIS — D649 Anemia, unspecified: Secondary | ICD-10-CM | POA: Diagnosis present

## 2016-01-01 DIAGNOSIS — D631 Anemia in chronic kidney disease: Secondary | ICD-10-CM | POA: Diagnosis present

## 2016-01-01 DIAGNOSIS — Z90710 Acquired absence of both cervix and uterus: Secondary | ICD-10-CM | POA: Diagnosis not present

## 2016-01-01 DIAGNOSIS — Z170 Estrogen receptor positive status [ER+]: Secondary | ICD-10-CM | POA: Diagnosis not present

## 2016-01-01 DIAGNOSIS — Z9221 Personal history of antineoplastic chemotherapy: Secondary | ICD-10-CM | POA: Diagnosis not present

## 2016-01-01 DIAGNOSIS — D571 Sickle-cell disease without crisis: Secondary | ICD-10-CM | POA: Diagnosis present

## 2016-01-01 DIAGNOSIS — Z923 Personal history of irradiation: Secondary | ICD-10-CM | POA: Diagnosis not present

## 2016-01-01 DIAGNOSIS — F419 Anxiety disorder, unspecified: Secondary | ICD-10-CM | POA: Diagnosis present

## 2016-01-01 DIAGNOSIS — Z79899 Other long term (current) drug therapy: Secondary | ICD-10-CM | POA: Diagnosis not present

## 2016-01-01 DIAGNOSIS — F329 Major depressive disorder, single episode, unspecified: Secondary | ICD-10-CM | POA: Diagnosis present

## 2016-01-01 DIAGNOSIS — N186 End stage renal disease: Secondary | ICD-10-CM | POA: Diagnosis present

## 2016-01-01 DIAGNOSIS — I4891 Unspecified atrial fibrillation: Secondary | ICD-10-CM | POA: Diagnosis present

## 2016-01-01 DIAGNOSIS — Z91048 Other nonmedicinal substance allergy status: Secondary | ICD-10-CM | POA: Diagnosis not present

## 2016-01-01 DIAGNOSIS — L409 Psoriasis, unspecified: Secondary | ICD-10-CM | POA: Diagnosis present

## 2016-01-01 DIAGNOSIS — Z992 Dependence on renal dialysis: Secondary | ICD-10-CM | POA: Diagnosis not present

## 2016-01-01 DIAGNOSIS — E876 Hypokalemia: Secondary | ICD-10-CM | POA: Diagnosis present

## 2016-01-01 DIAGNOSIS — Z7901 Long term (current) use of anticoagulants: Secondary | ICD-10-CM | POA: Diagnosis not present

## 2016-01-01 LAB — CBC
HCT: 23.2 % — ABNORMAL LOW (ref 35.0–47.0)
HEMATOCRIT: 21.2 % — AB (ref 35.0–47.0)
Hemoglobin: 7.1 g/dL — ABNORMAL LOW (ref 12.0–16.0)
Hemoglobin: 7.8 g/dL — ABNORMAL LOW (ref 12.0–16.0)
MCH: 28.8 pg (ref 26.0–34.0)
MCH: 28.8 pg (ref 26.0–34.0)
MCHC: 33.5 g/dL (ref 32.0–36.0)
MCHC: 33.5 g/dL (ref 32.0–36.0)
MCV: 86 fL (ref 80.0–100.0)
MCV: 86.1 fL (ref 80.0–100.0)
PLATELETS: 177 10*3/uL (ref 150–440)
Platelets: 162 10*3/uL (ref 150–440)
RBC: 2.46 MIL/uL — ABNORMAL LOW (ref 3.80–5.20)
RBC: 2.7 MIL/uL — AB (ref 3.80–5.20)
RDW: 18.2 % — AB (ref 11.5–14.5)
RDW: 17.8 % — AB (ref 11.5–14.5)
WBC: 7 10*3/uL (ref 3.6–11.0)
WBC: 6.4 10*3/uL (ref 3.6–11.0)

## 2016-01-01 LAB — BASIC METABOLIC PANEL
Anion gap: 8 (ref 5–15)
BUN: 13 mg/dL (ref 6–20)
CHLORIDE: 103 mmol/L (ref 101–111)
CO2: 26 mmol/L (ref 22–32)
Calcium: 8.9 mg/dL (ref 8.9–10.3)
Creatinine, Ser: 4 mg/dL — ABNORMAL HIGH (ref 0.44–1.00)
GFR calc Af Amer: 13 mL/min — ABNORMAL LOW (ref >60)
GFR calc non Af Amer: 11 mL/min — ABNORMAL LOW (ref >60)
GLUCOSE: 88 mg/dL (ref 65–99)
POTASSIUM: 3.4 mmol/L — AB (ref 3.5–5.1)
Sodium: 137 mmol/L (ref 135–145)

## 2016-01-01 LAB — MAGNESIUM: Magnesium: 1.9 mg/dL (ref 1.7–2.4)

## 2016-01-01 LAB — PREPARE RBC (CROSSMATCH)

## 2016-01-01 LAB — VANCOMYCIN, RANDOM: Vancomycin Rm: 15

## 2016-01-01 LAB — PROTIME-INR
INR: 2.01
Prothrombin Time: 23.1 seconds — ABNORMAL HIGH (ref 11.4–15.2)

## 2016-01-01 MED ORDER — ZOLPIDEM TARTRATE 5 MG PO TABS
5.0000 mg | ORAL_TABLET | Freq: Every evening | ORAL | Status: DC | PRN
  Administered 2016-01-01 – 2016-01-02 (×2): 5 mg via ORAL
  Filled 2016-01-01 (×2): qty 1

## 2016-01-01 MED ORDER — WARFARIN SODIUM 5 MG PO TABS: 5.0000 mg | ORAL_TABLET | ORAL | Status: DC

## 2016-01-01 MED ORDER — WARFARIN SODIUM 5 MG PO TABS
7.5000 mg | ORAL_TABLET | ORAL | Status: DC
  Administered 2016-01-01 – 2016-01-03 (×3): 7.5 mg via ORAL
  Filled 2016-01-01 (×3): qty 2

## 2016-01-01 MED ORDER — LEVETIRACETAM 500 MG PO TABS: 500.0000 mg | ORAL_TABLET | ORAL | Status: DC | PRN

## 2016-01-01 MED ORDER — VANCOMYCIN HCL IN DEXTROSE 750-5 MG/150ML-% IV SOLN
750.0000 mg | INTRAVENOUS | Status: DC | PRN
  Filled 2016-01-01: qty 150

## 2016-01-01 MED ORDER — LEVETIRACETAM 500 MG PO TABS
500.0000 mg | ORAL_TABLET | Freq: Every day | ORAL | Status: DC
  Administered 2016-01-02 – 2016-01-03 (×2): 500 mg via ORAL
  Filled 2016-01-01 (×3): qty 1

## 2016-01-01 MED ORDER — SODIUM CHLORIDE 0.9 % IV SOLN
Freq: Once | INTRAVENOUS | Status: AC
  Administered 2016-01-01: 13:00:00 via INTRAVENOUS

## 2016-01-01 MED ORDER — VANCOMYCIN HCL 500 MG IV SOLR
500.0000 mg | Freq: Once | INTRAVENOUS | Status: AC
  Administered 2016-01-01: 500 mg via INTRAVENOUS
  Filled 2016-01-01: qty 500

## 2016-01-01 NOTE — Progress Notes (Signed)
Pharmacy Antibiotic Note  Teresa Cook is a 60 y.o. female admitted on 12/31/2015 with Seizures.   Pharmacy has been consulted for Vancomycin dosing by nephology. Patient had corynebacterium sepsis at last visit and her HD catheter had to be removed.   Plan: Vanc random: 15  Will dive vancomycin 500mg  x1 dose since random level is 15. Will order vancomycin 750mg  IV after every HD there after.  Patient is currently on MWF schedule, but due to holiday patient received HD Sunday.  Will follow HD schedule and ensure vancomycin is given following each session. WIll plan for vanc level prior to 3rd HD.   Weight: 129 lb 14.4 oz (58.9 kg)  Temp (24hrs), Avg:99 F (37.2 C), Min:98.6 F (37 C), Max:99.5 F (37.5 C)   Recent Labs Lab 12/26/15 0501 12/27/15 0512 12/28/15 0443 12/31/15 1634 01/01/16 0446  WBC 10.0 12.0* 11.6* 10.8 7.0  CREATININE 4.37*  --   --  2.62* 4.00*  VANCORANDOM  --  29  --   --   --     Estimated Creatinine Clearance: 13.7 mL/min (by C-G formula based on SCr of 4 mg/dL (H)).    Allergies  Allergen Reactions  . Adhesive [Tape] Itching    Silk tape is ok to use.    Antimicrobials this admission: 12/25 vancomycin  >>   Dose adjustments this admission:  Microbiology results: 12/24 BCx: pending   Thank you for allowing pharmacy to be a part of this patient's care.  Pernell Dupre, PharmD, BCPS Clinical Pharmacist 01/01/2016 11:12 AM

## 2016-01-01 NOTE — Progress Notes (Signed)
[facility] [place] NAME: Teresa Cook    MR#:  [id number]  DATE OF BIRTH:  03/05/1955  SUBJECTIVE:  CHIEF COMPLAINT:   Chief Complaint  Patient presents with  . Seizures  achy all over her body, mainly in her right leg, very weak REVIEW OF SYSTEMS:  Review of Systems  Constitutional: Positive for malaise/fatigue. Negative for chills, fever and weight loss.  HENT: Negative for nosebleeds and sore throat.   Eyes: Negative for blurred vision.  Respiratory: Negative for cough, shortness of breath and wheezing.   Cardiovascular: Negative for chest pain, orthopnea, leg swelling and PND.  Gastrointestinal: Negative for abdominal pain, constipation, diarrhea, heartburn, nausea and vomiting.  Genitourinary: Negative for dysuria and urgency.  Musculoskeletal: Positive for back pain, falls, joint pain and myalgias.  Skin: Negative for rash.  Neurological: Positive for seizures and weakness. Negative for dizziness, speech change, focal weakness and headaches.  Endo/Heme/Allergies: Does not bruise/bleed easily.  Psychiatric/Behavioral: Negative for depression.    DRUG ALLERGIES:   Allergies  Allergen Reactions  . Adhesive [Tape] Itching    Silk tape is ok to use.   VITALS:  Blood pressure 125/63, pulse 86, temperature 98.8 F (37.1 C), temperature source Oral, resp. rate 18, weight 58.9 kg (129 lb 14.4 oz), SpO2 94 %. PHYSICAL EXAMINATION:  Physical Exam  Constitutional: She is oriented to person, place, and time and well-developed, well-nourished, and in no distress.  HENT:  Head: Normocephalic and atraumatic.  Eyes: Conjunctivae and EOM are normal. Pupils are equal, round, and reactive to light.  Neck: Normal range of motion. Neck supple. No tracheal deviation present. No thyromegaly present.  Cardiovascular: Normal rate, regular rhythm and normal heart sounds.   Pulmonary/Chest: Effort normal and breath sounds normal. No  respiratory distress. She has no wheezes. She exhibits no tenderness.  Abdominal: Soft. Bowel sounds are normal. She exhibits no distension. There is no tenderness.  Musculoskeletal: Normal range of motion.  Neurological: She is alert and oriented to person, place, and time. No cranial nerve deficit.  Skin: Skin is warm and dry. No rash noted.  Psychiatric: Mood and affect normal.   LABORATORY PANEL:   CBC  Recent Labs Lab 01/01/16 0446  WBC 7.0  HGB 7.1*  HCT 21.2*  PLT 162   ------------------------------------------------------------------------------------------------------------------ Chemistries   Recent Labs Lab 12/31/15 1634 01/01/16 0446  NA 136 137  K 3.5 3.4*  CL 100* 103  CO2 24 26  GLUCOSE 103* 88  BUN 9 13  CREATININE 2.62* 4.00*  CALCIUM 9.5 8.9  MG 1.8  --   AST 29  --   ALT 15  --   ALKPHOS 189*  --   BILITOT 0.6  --    RADIOLOGY:  Ct Head Wo Contrast  Result Date: 12/31/2015 CLINICAL DATA:  Seizure after dialysis. EXAM: CT HEAD WITHOUT CONTRAST TECHNIQUE: Contiguous axial images were obtained from the base of the skull through the vertex without intravenous contrast. COMPARISON:  02/02/2014 FINDINGS: Brain: No evidence of acute infarction, hemorrhage, hydrocephalus, or extra-axial collection. 1.5 x 1.5 cm partially calcified extra-axial mass seen along the right tentorium, abutting the right cerebellar cortex. The mass is not well visualized, however appears increased from the prior CT dated 02/02/2014, when it measured 1.4 x 1.1 cm. Vascular: No hyperdense vessels. Calcific atherosclerotic disease at the skullbase. Skull: Negative for fracture or focal lesion. Hyperdense appearance of the osseous structures, likely related to chronic renal disease. Sinuses/Orbits: Polypoid mucosal  thickening of the right maxillary sinus and right sphenoid sinus. Minimal polypoid mucosal thickening of bilateral ethmoid sinuses. Partial opacification of the bilateral  mastoid air cells. Other: None IMPRESSION: No evident of acute infarction or hemorrhage. Apparent increase in the size of known partially calcified extra-axial mass along the right tentorium. Nonemergent evaluation with contrast-enhanced brain MRI may provide better characterisation of this lesion. Electronically Signed   By: Fidela Salisbury M.D.   On: 12/31/2015 17:24   Dg Foot Complete Right  Result Date: 12/31/2015 CLINICAL DATA:  Status post fall.  Unable to bear weight. EXAM: RIGHT FOOT COMPLETE - 3+ VIEW COMPARISON:  None. FINDINGS: Moderate to severe osteopenia. No evidence of displaced fractures seen radiographically. Mild soft tissue swelling. IMPRESSION: No evidence of displaced fractures seen radiographically. However fractures could be easily overlooked in the settings of osteopenia, and therefore if the patient continues to be symptomatic CT of the foot may be considered. Electronically Signed   By: Fidela Salisbury M.D.   On: 12/31/2015 17:14   ASSESSMENT AND PLAN:   * Seizures - continue Keppra twice a day.   MRI on the brain.   Neurologic consult.  * Anemia of chronic kidney disease - Hemoglobin down to 7.1.  We will go ahead and order 1 unit of packed red blood cells for transfusion.  Patient in agreement  * Hypokalemia - Replete and recheck  * End-stage renal disease on hemodialysis    Nephrology consult for HD  * Recent bacteremia   Check Blood culture, she was suppose to finish Abx on 12/23 as per last Progress notes in last admission.  * DVT  On Coumadin- Adjust per pharmacy. - INR 2.01  * Hypertension - continue Metoprolol, Losartan.  * Depression   Zoloft.     All the records are reviewed and case discussed with Care Management/Social Worker. Management plans discussed with the patient, family and they are in agreement.  CODE STATUS: Full code  TOTAL TIME TAKING CARE OF THIS PATIENT: 35 minutes.   More than 50% of the time was spent in  counseling/coordination of care: YES  POSSIBLE D/C IN 1-2 DAYS, DEPENDING ON CLINICAL CONDITION.   Max Sane M.D on 01/01/2016 at 9:16 AM  Between 7am to 6pm - Pager - [phone]  After 6pm go to [email or web address] - Proofreader  [facility]  Office  [phone]  CC: Primary care physician; Murlean Iba, MD  Note: This dictation was prepared with Dragon dictation along with smaller phrase technology. Any transcriptional errors that result from this process are unintentional.

## 2016-01-01 NOTE — Progress Notes (Addendum)
ANTICOAGULATION CONSULT NOTE - Initial Consult  Pharmacy Consult for Warfarin Indication: continuation of therapy for DVT  Allergies  Allergen Reactions  . Adhesive [Tape] Itching    Silk tape is ok to use.    Patient Measurements: Weight: 129 lb 14.4 oz (58.9 kg) Heparin Dosing Weight:    Vital Signs: Temp: 98.8 F (37.1 C) (12/25 0747) Temp Source: Oral (12/25 0747) BP: 125/63 (12/25 0747) Pulse Rate: 86 (12/25 0747)  Labs:  Recent Labs  12/31/15 1634 01/01/16 0446  HGB 8.0* 7.1*  HCT 23.7* 21.2*  PLT 221 162  LABPROT 22.9* 23.1*  INR 1.99 2.01  CREATININE 2.62* 4.00*  TROPONINI 0.04*  --     Estimated Creatinine Clearance: 13.7 mL/min (by C-G formula based on SCr of 4 mg/dL (H)).   Medical History: Past Medical History:  Diagnosis Date  . Anemia   . Anxiety   . Breast cancer ([place])   . CHF (congestive heart failure) ([place])   . Chronic kidney disease   . Depression   . Dialysis patient ([place])   . DVT (deep venous thrombosis) ([facility])    left leg  . DVT (deep venous thrombosis) ([place])   . Gout   . HTN (hypertension)   . Hypertension   . Parathyroid abnormality ([place])   . Parathyroid disease ([place])   . Psoriasis   . Renal insufficiency   . Sickle cell anemia ([facility])    traits     Assessment: Pharmacy consulted to dose Warfarin for continuation of therapy. Patient was taking 5mg  on Friday, Saturday, and Sunday and 7.5mg  Monday through Thursday.  Goal of Therapy:  INR 2-3 Monitor platelets by anticoagulation protocol: Yes   Plan:  Will INR therapeutic at 2.01 this morning. Will continue patients home regimen of Warfarin 5mg  Friday, Saturday Sunday, and Warfarin 7.5mg  Monday-Thursday. F/U on INR with am labs.   Pernell Dupre, PharmD, BCPS Clinical Pharmacist 01/01/2016 7:51 AM

## 2016-01-01 NOTE — Consult Note (Signed)
Reason for Consult:seizure activity  Referring Physician: Dr. Manuella Ghazi  CC: seizure activity   HPI: Teresa Cook is an 60 y.o. female  with a known history of ESRD, CHF, Depression, DVT, Htn, Was recently admitted in hospital with bacteremia and she had her permacath and med port taken out, and she was on antibiotics. Yesterday morning she had some twitching movements in both her arms and she could not hold things with that, still was taken to her dialysis center by her husband where she was finished full course of dialysis but continued to have same twitching movements. Pt was suspected of having generalized seizure activity. Similar episode 6-7 months ago with generalized seizure activity but did not receive treatment for it.    Past Medical History:  Diagnosis Date  . Anemia   . Anxiety   . Breast cancer ([place])   . CHF (congestive heart failure) ([place])   . Chronic kidney disease   . Depression   . Dialysis patient ([place])   . DVT (deep venous thrombosis) ([facility])    left leg  . DVT (deep venous thrombosis) ([place])   . Gout   . HTN (hypertension)   . Hypertension   . Parathyroid abnormality ([place])   . Parathyroid disease ([place])   . Psoriasis   . Renal insufficiency   . Sickle cell anemia ([facility])    traits    Past Surgical History:  Procedure Laterality Date  . ABDOMINAL HYSTERECTOMY    . APPENDECTOMY    . BREAST BIOPSY Left 10/28/2013   benign  . BREAST EXCISIONAL BIOPSY Left 2002   benign  . INSERTION OF DIALYSIS CATHETER  2014  . PARTIAL HYSTERECTOMY    . PERIPHERAL VASCULAR CATHETERIZATION N/A 12/25/2015   Procedure: Dialysis/Perma Catheter Insertion;  Surgeon: Algernon Huxley, MD;  Location: [facility];  Service: Cardiovascular;  Laterality: N/A;  . PORT-A-CATH REMOVAL N/A 12/20/2015   Procedure: REMOVAL PORT-A-CATH;  Surgeon: Hubbard Robinson, MD;  Location: [facility];  Service: General;  Laterality: N/A;  left    . PORTACATH PLACEMENT Left 08/21/2015   Procedure:  INSERTION PORT-A-CATH;  Surgeon: Hubbard Robinson, MD;  Location: [facility];  Service: General;  Laterality: Left;    Family History  Problem Relation Age of Onset  . Stroke Mother   . CVA Mother   . Hypertension Mother   . Hypertension Father   . Hypertension Sister   . Diabetes Sister   . Cancer Sister 5    Breast  . Stroke Brother   . Hypertension Brother   . Breast cancer Maternal Aunt 70    Social History:  reports that she has never smoked. She has never used smokeless tobacco. She reports that she does not drink alcohol or use drugs.  Allergies  Allergen Reactions  . Adhesive [Tape] Itching    Silk tape is ok to use.    Medications:  Prior to Admission:  Prescriptions Prior to Admission  Medication Sig Dispense Refill Last Dose  . acetaminophen (TYLENOL) 325 MG tablet Take 2 tablets (650 mg total) by mouth every 6 (six) hours as needed for mild pain (or Fever >/= 101).     Marland Kitchen ALPRAZolam (XANAX) 0.5 MG tablet Take 1 tablet (0.5 mg total) by mouth 2 (two) times daily as needed for anxiety. 20 tablet 0   . aspirin EC 81 MG EC tablet Take 1 tablet (81 mg total) by mouth daily.     . benzonatate (TESSALON) 200 MG capsule Take  200 mg by mouth 3 (three) times daily as needed for cough.   12/15/2015 at Unknown time  . diphenhydrAMINE (BENADRYL) 25 mg capsule Take 1 capsule (25 mg total) by mouth every 8 (eight) hours as needed for itching. 30 capsule 0   . feeding supplement, ENSURE ENLIVE, (ENSURE ENLIVE) LIQD Take 237 mLs by mouth 3 (three) times daily between meals. 237 mL 12   . losartan (COZAAR) 50 MG tablet Take 100 mg by mouth daily. Takes on non-dialysis days=Tuesday, Thursday, Saturday and Sunday.   Past Week at Unknown time  . metoprolol (LOPRESSOR) 100 MG tablet Take 0.5 tablets (50 mg total) by mouth 2 (two) times daily. Takes on non-dialysis days=Tuesday, Thursday, Saturday and Sunday. 60 tablet 0   . Nutritional Supplements (FEEDING SUPPLEMENT, NEPRO CARB STEADY,)  LIQD Take 237 mLs by mouth 2 (two) times daily between meals. 60 Can 0   . pantoprazole (PROTONIX) 40 MG tablet Take 1 tablet (40 mg total) by mouth 2 (two) times daily before a meal. 60 tablet 0 12/15/2015 at Unknown time  . senna-docusate (SENOKOT-S) 8.6-50 MG tablet Take 1 tablet by mouth at bedtime as needed for mild constipation.     . sertraline (ZOLOFT) 100 MG tablet Take 100 mg by mouth daily.    Past Week at Unknown time  . traMADol (ULTRAM) 50 MG tablet Take 1 tablet (50 mg total) by mouth every 6 (six) hours as needed for moderate pain. 20 tablet 0   . warfarin (COUMADIN) 5 MG tablet Take 1 tablet Friday, Saturday and Sundays. Take 1 1/2 tablets the other days of the week   Past Week at Unknown time  . zolpidem (AMBIEN) 10 MG tablet Take 10 mg by mouth at bedtime as needed for sleep.   Past Week at Unknown time    ROS: History obtained from the patient  General ROS: negative for - chills, fatigue, fever, night sweats, weight gain or weight loss Psychological ROS: negative for - behavioral disorder, hallucinations, memory difficulties, mood swings or suicidal ideation Ophthalmic ROS: negative for - blurry vision, double vision, eye pain or loss of vision ENT ROS: negative for - epistaxis, nasal discharge, oral lesions, sore throat, tinnitus or vertigo Allergy and Immunology ROS: negative for - hives or itchy/watery eyes Hematological and Lymphatic ROS: negative for - bleeding problems, bruising or swollen lymph nodes Endocrine ROS: negative for - galactorrhea, hair pattern changes, polydipsia/polyuria or temperature intolerance Respiratory ROS: negative for - cough, hemoptysis, shortness of breath or wheezing Cardiovascular ROS: negative for - chest pain, dyspnea on exertion, edema or irregular heartbeat Gastrointestinal ROS: negative for - abdominal pain, diarrhea, hematemesis, nausea/vomiting or stool incontinence Genito-Urinary ROS: negative for - dysuria, hematuria, incontinence or  urinary frequency/urgency Musculoskeletal ROS: negative for - joint swelling or muscular weakness Neurological ROS: as noted in HPI Dermatological ROS: negative for rash and skin lesion changes  Physical Examination: Blood pressure 125/63, pulse 86, temperature 98.8 F (37.1 C), temperature source Oral, resp. rate 18, weight 58.9 kg (129 lb 14.4 oz), SpO2 94 %.    Neurological Examination Mental Status: Alert, oriented, thought content appropriate.  Speech fluent without evidence of aphasia.  Able to follow 3 step commands without difficulty. Cranial Nerves: II: Discs flat bilaterally; Visual fields grossly normal, pupils equal, round, reactive to light and accommodation III,IV, VI: ptosis not present, extra-ocular motions intact bilaterally V,VII: smile symmetric, facial light touch sensation normal bilaterally VIII: hearing normal bilaterally IX,X: gag reflex present XI: bilateral shoulder shrug XII:  midline tongue extension Motor: Right : Upper extremity   5/5    Left:     Upper extremity   5/5  Lower extremity   5/5     Lower extremity   5/5 Tone and bulk:normal tone throughout; no atrophy noted Sensory: Pinprick and light touch intact throughout, bilaterally Deep Tendon Reflexes: 0 and symmetric throughout Plantars: Right: downgoing   Left: downgoing Cerebellar: normal finger-to-nose, normal rapid alternating movements and normal heel-to-shin test Gait: not tested     Laboratory Studies:   Basic Metabolic Panel:  Recent Labs Lab 12/25/15 2030 12/26/15 0501 12/28/15 0443 12/31/15 1634 01/01/16 0446  NA  --  135  --  136 137  K  --  4.2  --  3.5 3.4*  CL  --  97*  --  100* 103  CO2  --  28  --  24 26  GLUCOSE  --  89  --  103* 88  BUN  --  23*  --  9 13  CREATININE  --  4.37*  --  2.62* 4.00*  CALCIUM  --  10.3  --  9.5 8.9  MG  --   --   --  1.8 1.9  PHOS 4.8*  --  5.3*  --   --     Liver Function Tests:  Recent Labs Lab 12/31/15 1634  AST 29  ALT  15  ALKPHOS 189*  BILITOT 0.6  PROT 6.9  ALBUMIN 3.2*   No results for input(s): LIPASE, AMYLASE in the last 168 hours.  Recent Labs Lab 12/31/15 1854  AMMONIA 9    CBC:  Recent Labs Lab 12/26/15 0501 12/27/15 0512 12/28/15 0443 12/31/15 1634 01/01/16 0446  WBC 10.0 12.0* 11.6* 10.8 7.0  HGB 9.2* 8.4* 8.2* 8.0* 7.1*  HCT 27.4* 25.5* 24.8* 23.7* 21.2*  MCV 84.8 84.5 84.2 85.9 86.0  PLT 206 218 227 221 162    Cardiac Enzymes:  Recent Labs Lab 12/26/15 1516 12/26/15 2026 12/27/15 0512 12/31/15 1634  TROPONINI 0.18* 0.17* 0.15* 0.04*    BNP: Invalid input(s): POCBNP  CBG: No results for input(s): GLUCAP in the last 168 hours.  Microbiology: Results for orders placed or performed during the hospital encounter of 12/31/15  CULTURE, BLOOD (ROUTINE X 2) w Reflex to ID Panel     Status: None (Preliminary result)   Collection Time: 12/31/15 10:10 PM  Result Value Ref Range Status   Specimen Description BLOOD LEFT ANTECUBITAL  Final   Special Requests   Final    BOTTLES DRAWN AEROBIC AND ANAEROBIC 14CCAERO,15CCANA   Culture NO GROWTH < 12 HOURS  Final   Report Status PENDING  Incomplete  CULTURE, BLOOD (ROUTINE X 2) w Reflex to ID Panel     Status: None (Preliminary result)   Collection Time: 12/31/15 10:10 PM  Result Value Ref Range Status   Specimen Description BLOOD RIGHT HAND  Final   Special Requests BOTTLES DRAWN AEROBIC AND ANAEROBIC [place]  Final   Culture NO GROWTH < 12 HOURS  Final   Report Status PENDING  Incomplete    Coagulation Studies:  Recent Labs  12/31/15 1634 01/01/16 0446  LABPROT 22.9* 23.1*  INR 1.99 2.01    Urinalysis: No results for input(s): COLORURINE, LABSPEC, PHURINE, GLUCOSEU, HGBUR, BILIRUBINUR, KETONESUR, PROTEINUR, UROBILINOGEN, NITRITE, LEUKOCYTESUR in the last 168 hours.  Invalid input(s): APPERANCEUR  Lipid Panel:     Component Value Date/Time   CHOL 250 (H) 02/03/2014 0135   TRIG 144 02/03/2014 0135  HDL 35 (L) 02/03/2014 0135   VLDL 29 02/03/2014 0135   LDLCALC 186 (H) 02/03/2014 0135    HgbA1C:  Lab Results  Component Value Date   HGBA1C  08/31/2015    UNABLE TO REPORT A1C DUE TO UNKNOWN INTERFERING FACTOR CAUSING THE ANALYTICAL RANGE TO BE OUTSIDE OF ANALYZER RANGE.  SAMPLE SENT TO [facility] FOR AN ALTERNATIVE METHOD.    Urine Drug Screen:     Component Value Date/Time   LABOPIA NEGATIVE 10/04/2013 1553   COCAINSCRNUR NEGATIVE 10/04/2013 1553   LABBENZ POSITIVE 10/04/2013 1553   AMPHETMU NEGATIVE 10/04/2013 1553   THCU NEGATIVE 10/04/2013 1553   LABBARB NEGATIVE 10/04/2013 1553    Alcohol Level: No results for input(s): ETH in the last 168 hours.   Imaging: Ct Head Wo Contrast  Result Date: 12/31/2015 CLINICAL DATA:  Seizure after dialysis. EXAM: CT HEAD WITHOUT CONTRAST TECHNIQUE: Contiguous axial images were obtained from the base of the skull through the vertex without intravenous contrast. COMPARISON:  02/02/2014 FINDINGS: Brain: No evidence of acute infarction, hemorrhage, hydrocephalus, or extra-axial collection. 1.5 x 1.5 cm partially calcified extra-axial mass seen along the right tentorium, abutting the right cerebellar cortex. The mass is not well visualized, however appears increased from the prior CT dated 02/02/2014, when it measured 1.4 x 1.1 cm. Vascular: No hyperdense vessels. Calcific atherosclerotic disease at the skullbase. Skull: Negative for fracture or focal lesion. Hyperdense appearance of the osseous structures, likely related to chronic renal disease. Sinuses/Orbits: Polypoid mucosal thickening of the right maxillary sinus and right sphenoid sinus. Minimal polypoid mucosal thickening of bilateral ethmoid sinuses. Partial opacification of the bilateral mastoid air cells. Other: None IMPRESSION: No evident of acute infarction or hemorrhage. Apparent increase in the size of known partially calcified extra-axial mass along the right tentorium. Nonemergent  evaluation with contrast-enhanced brain MRI may provide better characterisation of this lesion. Electronically Signed   By: Fidela Salisbury M.D.   On: 12/31/2015 17:24   Mr Brain Wo Contrast  Result Date: 01/01/2016 CLINICAL DATA:  Possible seizure. Fall, hitting the back of the head. History of end-stage renal disease on dialysis. EXAM: MRI HEAD WITHOUT CONTRAST TECHNIQUE: Multiplanar, multiecho pulse sequences of the brain and surrounding structures were obtained without intravenous contrast. COMPARISON:  Head CTs 12/31/2015 and earlier FINDINGS: Brain: The study is mildly motion degraded. The mesial temporal lobe structures are normal in appearance within this limitation. There is no evidence of acute infarct, intracranial hemorrhage, midline shift, or extra-axial fluid collection. The ventricles and sulci are normal in size for age. Periventricular and scattered subcortical white matter T2 hyperintensities are nonspecific but compatible with minimal chronic small vessel ischemic disease. Intermediate to low T1 and T2 signal mass along the undersurface of the posterior tentorium in/just right of midline measures approximately 1.7 x 1.0 x 1.5 cm, partially calcified on CT and at most minimally increased in size from a 10/04/2013 CT. There is no associated cerebellar edema. Vascular: Major intracranial vascular flow voids are preserved. Skull and upper cervical spine: Diffusely diminished bone marrow signal intensity which may relate to patient's chronic renal failure and anemia. Sinuses/Orbits: Minimal bilateral ethmoid and mild right sphenoid and right maxillary sinus mucosal thickening. Moderate bilateral mastoid effusions. Other: 1.2 cm subcutaneous nodule in the midline of the frontal scalp, benign in appearance and possibly a sebaceous cyst. IMPRESSION: 1. No acute intracranial abnormality or seizure focus identified. 2. 1.7 cm posterior fossa mass most consistent with meningioma. No cerebellar  edema. 3. Minimal chronic small vessel  ischemic disease. Electronically Signed   By: Logan Bores M.D.   On: 01/01/2016 12:09   Dg Foot Complete Right  Result Date: 12/31/2015 CLINICAL DATA:  Status post fall.  Unable to bear weight. EXAM: RIGHT FOOT COMPLETE - 3+ VIEW COMPARISON:  None. FINDINGS: Moderate to severe osteopenia. No evidence of displaced fractures seen radiographically. Mild soft tissue swelling. IMPRESSION: No evidence of displaced fractures seen radiographically. However fractures could be easily overlooked in the settings of osteopenia, and therefore if the patient continues to be symptomatic CT of the foot may be considered. Electronically Signed   By: Fidela Salisbury M.D.   On: 12/31/2015 17:14     Assessment/Plan:  60 y.o. female  with a known history of ESRD, CHF, Depression, DVT, Htn, Was recently admitted in hospital with bacteremia and she had her permacath and med port taken out, and she was on antibiotics. Yesterday morning she had some twitching movements in both her arms and she could not hold things with that, still was taken to her dialysis center by her husband where she was finished full course of dialysis but continued to have same twitching movements. Pt was suspected of having generalized seizure activity. Similar episode 6-7 months ago with generalized seizure activity but did not receive treatment for it.    CTH no acute abnormalities MRI brain chronic posterior circulation meningioma.   Will change Keppra to daily and will need 500 mg after each HD No further imaging from neuro stand point/dc planning  Leotis Pain   01/01/2016, 12:40 PM

## 2016-01-01 NOTE — Progress Notes (Signed)
I have discussed her condition and management plan with her daughter, [place] at [phone] and she is in agreement with the management plans.

## 2016-01-01 NOTE — Progress Notes (Signed)
[facility]  ROUNDING NOTE   Subjective:  Patient well known to from last admission. Patient had cornybacterium sepsis at last visit and her dialysis catheter was removed and exchanged with a femoral permcath.  She went for dialysis yesterday given the Christmas schedule. She was noted to be quite weak prior to going to dialysis. In addition she's been having some twitching at home. After dialysis she was noted to have a tonic-clonic seizure by her daughter. This lasted approximately 45 seconds. Neurology consultation is pending.  Objective:  Vital signs in last 24 hours:  Temp:  [98.6 F (37 C)-99.5 F (37.5 C)] 98.8 F (37.1 C) (12/25 0747) Pulse Rate:  [40-102] 86 (12/25 0747) Resp:  [15-25] 18 (12/25 0747) BP: (112-175)/(63-96) 125/63 (12/25 0747) SpO2:  [93 %-99 %] 94 % (12/25 0747) Weight:  [58.9 kg (129 lb 14.4 oz)] 58.9 kg (129 lb 14.4 oz) (12/24 2149)  Weight change:  Filed Weights   12/31/15 2149  Weight: 58.9 kg (129 lb 14.4 oz)    Intake/Output: No intake/output data recorded.   Intake/Output this shift:  Total I/O In: 240 [P.O.:240] Out: 0   Physical Exam: General: chronically ill appearing  ENT Moist oral mucus membranes  Eyes: anicteric  Neck: supple  Lungs:  Clear to auscultation bilateral, crackles at bases  Heart: Irregular  Abdomen:  Soft, nontender, BS present  Extremities: no peripheral edema  Neurologic: Awake, alert, follows commands  Skin: No lesions  Access: Right femoral permcath 27/51/70    Basic Metabolic Panel:  Recent Labs Lab 12/25/15 2030 12/26/15 0501 12/28/15 0443 12/31/15 1634 01/01/16 0446  NA  --  135  --  136 137  K  --  4.2  --  3.5 3.4*  CL  --  97*  --  100* 103  CO2  --  28  --  24 26  GLUCOSE  --  89  --  103* 88  BUN  --  23*  --  9 13  CREATININE  --  4.37*  --  2.62* 4.00*  CALCIUM  --  10.3  --  9.5 8.9  MG  --   --   --  1.8 1.9  PHOS 4.8*  --  5.3*  --   --     Liver Function  Tests:  Recent Labs Lab 12/31/15 1634  AST 29  ALT 15  ALKPHOS 189*  BILITOT 0.6  PROT 6.9  ALBUMIN 3.2*   No results for input(s): LIPASE, AMYLASE in the last 168 hours.  Recent Labs Lab 12/31/15 1854  AMMONIA 9    CBC:  Recent Labs Lab 12/26/15 0501 12/27/15 0512 12/28/15 0443 12/31/15 1634 01/01/16 0446  WBC 10.0 12.0* 11.6* 10.8 7.0  HGB 9.2* 8.4* 8.2* 8.0* 7.1*  HCT 27.4* 25.5* 24.8* 23.7* 21.2*  MCV 84.8 84.5 84.2 85.9 86.0  PLT 206 218 227 221 162    Cardiac Enzymes:  Recent Labs Lab 12/26/15 1516 12/26/15 2026 12/27/15 0512 12/31/15 1634  TROPONINI 0.18* 0.17* 0.15* 0.04*    BNP: Invalid input(s): POCBNP  CBG: No results for input(s): GLUCAP in the last 168 hours.  Microbiology: Results for orders placed or performed during the hospital encounter of 12/31/15  CULTURE, BLOOD (ROUTINE X 2) w Reflex to ID Panel     Status: None (Preliminary result)   Collection Time: 12/31/15 10:10 PM  Result Value Ref Range Status   Specimen Description BLOOD LEFT ANTECUBITAL  Final   Special Requests  Final    BOTTLES DRAWN AEROBIC AND ANAEROBIC 14CCAERO,15CCANA   Culture NO GROWTH < 12 HOURS  Final   Report Status PENDING  Incomplete  CULTURE, BLOOD (ROUTINE X 2) w Reflex to ID Panel     Status: None (Preliminary result)   Collection Time: 12/31/15 10:10 PM  Result Value Ref Range Status   Specimen Description BLOOD RIGHT HAND  Final   Special Requests BOTTLES DRAWN AEROBIC AND ANAEROBIC [place]  Final   Culture NO GROWTH < 12 HOURS  Final   Report Status PENDING  Incomplete    Coagulation Studies:  Recent Labs  12/31/15 1634 01/01/16 0446  LABPROT 22.9* 23.1*  INR 1.99 2.01    Urinalysis: No results for input(s): COLORURINE, LABSPEC, PHURINE, GLUCOSEU, HGBUR, BILIRUBINUR, KETONESUR, PROTEINUR, UROBILINOGEN, NITRITE, LEUKOCYTESUR in the last 72 hours.  Invalid input(s): APPERANCEUR    Imaging: Ct Head Wo Contrast  Result Date:  12/31/2015 CLINICAL DATA:  Seizure after dialysis. EXAM: CT HEAD WITHOUT CONTRAST TECHNIQUE: Contiguous axial images were obtained from the base of the skull through the vertex without intravenous contrast. COMPARISON:  02/02/2014 FINDINGS: Brain: No evidence of acute infarction, hemorrhage, hydrocephalus, or extra-axial collection. 1.5 x 1.5 cm partially calcified extra-axial mass seen along the right tentorium, abutting the right cerebellar cortex. The mass is not well visualized, however appears increased from the prior CT dated 02/02/2014, when it measured 1.4 x 1.1 cm. Vascular: No hyperdense vessels. Calcific atherosclerotic disease at the skullbase. Skull: Negative for fracture or focal lesion. Hyperdense appearance of the osseous structures, likely related to chronic renal disease. Sinuses/Orbits: Polypoid mucosal thickening of the right maxillary sinus and right sphenoid sinus. Minimal polypoid mucosal thickening of bilateral ethmoid sinuses. Partial opacification of the bilateral mastoid air cells. Other: None IMPRESSION: No evident of acute infarction or hemorrhage. Apparent increase in the size of known partially calcified extra-axial mass along the right tentorium. Nonemergent evaluation with contrast-enhanced brain MRI may provide better characterisation of this lesion. Electronically Signed   By: Fidela Salisbury M.D.   On: 12/31/2015 17:24   Dg Foot Complete Right  Result Date: 12/31/2015 CLINICAL DATA:  Status post fall.  Unable to bear weight. EXAM: RIGHT FOOT COMPLETE - 3+ VIEW COMPARISON:  None. FINDINGS: Moderate to severe osteopenia. No evidence of displaced fractures seen radiographically. Mild soft tissue swelling. IMPRESSION: No evidence of displaced fractures seen radiographically. However fractures could be easily overlooked in the settings of osteopenia, and therefore if the patient continues to be symptomatic CT of the foot may be considered. Electronically Signed   By: Fidela Salisbury M.D.   On: 12/31/2015 17:14     Medications:    . sodium chloride   Intravenous Once  . aspirin EC  81 mg Oral Daily  . feeding supplement (ENSURE ENLIVE)  237 mL Oral TID BM  . heparin  5,000 Units Subcutaneous Q8H  . levETIRAcetam  500 mg Oral BID  . [START ON 01/02/2016] losartan  100 mg Oral Once per day on Sun Tue Thu Sat  . [START ON 01/02/2016] metoprolol  50 mg Oral 2 times per day on Sun Tue Thu Sat  . pantoprazole  40 mg Oral BID AC  . sertraline  100 mg Oral Daily  . sodium chloride flush  3 mL Intravenous Q12H  . [START ON 01/05/2016] warfarin  5 mg Oral Once per day on Sun Fri Sat   And  . warfarin  7.5 mg Oral Once per day on Mon Tue Wed  Thu  . Warfarin - Pharmacist Dosing Inpatient   Does not apply q1800   acetaminophen, ALPRAZolam, benzonatate, diphenhydrAMINE, senna-docusate, traMADol  Assessment/ Plan:  [name] is a 60 y.o. 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   [facility]/MWF/[address]  1. ESRD on HD MWF: Patient underwent hemodialysis yesterday given the Christmas schedule.  Therefore no urgent indication for dialysis today.  2. Bacteremia/sepsis: 12/9 gram positive bacilli from [place].- Corynebacterium Permcath removed. Blood cultures and cath tip cultures showed no growth  chemotherapy infusion port/ IJ permcath removed last admission - right femoral PermCath placed.  The patient was to receive vancomycin for an additional 2 weeks after discharge. We will consult pharmacy for this issue.  3. Anemia of chronic kidney disease:  - Avoid Epogen given recent diagnosis of breast  cancer/new onset seizure. - blood transfusion given last admission, continue to monitor CBC.  4. Secondary hyperparathyroidism. - Recheck phosphorus with next dialysis treatment.  5. New onset seizure. Reason for the seizure unclear. The patient is noted to be on tramadol which can lower the seizure threshold. Therefore we will discontinue tramadol. Epogen is being avoided given history of breast cancer. Epogen at times is also associated with seizures however she has not received this recently. Awaiting further input from neurology.     LOS: 0 Nevaeh Casillas 12/25/201710:47 AM

## 2016-01-02 LAB — BASIC METABOLIC PANEL
ANION GAP: 8 (ref 5–15)
BUN: 23 mg/dL — ABNORMAL HIGH (ref 6–20)
CHLORIDE: 103 mmol/L (ref 101–111)
CO2: 25 mmol/L (ref 22–32)
Calcium: 9.1 mg/dL (ref 8.9–10.3)
Creatinine, Ser: 6.04 mg/dL — ABNORMAL HIGH (ref 0.44–1.00)
GFR, EST AFRICAN AMERICAN: 8 mL/min — AB (ref >60)
GFR, EST NON AFRICAN AMERICAN: 7 mL/min — AB (ref >60)
Glucose, Bld: 92 mg/dL (ref 65–99)
POTASSIUM: 3.8 mmol/L (ref 3.5–5.1)
SODIUM: 136 mmol/L (ref 135–145)

## 2016-01-02 LAB — CBC
HCT: 21.3 % — ABNORMAL LOW (ref 35.0–47.0)
HEMOGLOBIN: 7.2 g/dL — AB (ref 12.0–16.0)
MCH: 28.9 pg (ref 26.0–34.0)
MCHC: 33.8 g/dL (ref 32.0–36.0)
MCV: 85.6 fL (ref 80.0–100.0)
PLATELETS: 148 10*3/uL — AB (ref 150–440)
RBC: 2.49 MIL/uL — AB (ref 3.80–5.20)
RDW: 17.8 % — ABNORMAL HIGH (ref 11.5–14.5)
WBC: 4.9 10*3/uL (ref 3.6–11.0)

## 2016-01-02 LAB — RETICULOCYTES
RBC.: 2.49 MIL/uL — AB (ref 3.80–5.20)
RETIC COUNT ABSOLUTE: 47.3 10*3/uL (ref 19.0–183.0)
RETIC CT PCT: 1.9 % (ref 0.4–3.1)

## 2016-01-02 LAB — FERRITIN: FERRITIN: 1696 ng/mL — AB (ref 11–307)

## 2016-01-02 LAB — IRON AND TIBC
IRON: 41 ug/dL (ref 28–170)
Saturation Ratios: 24 % (ref 10.4–31.8)
TIBC: 170 ug/dL — ABNORMAL LOW (ref 250–450)
UIBC: 129 ug/dL

## 2016-01-02 LAB — PROTIME-INR
INR: 2.13
PROTHROMBIN TIME: 24.2 s — AB (ref 11.4–15.2)

## 2016-01-02 LAB — VITAMIN B12: Vitamin B-12: 610 pg/mL (ref 180–914)

## 2016-01-02 LAB — FOLATE: Folate: 7.4 ng/mL (ref >5.9)

## 2016-01-02 LAB — PREPARE RBC (CROSSMATCH)

## 2016-01-02 MED ORDER — SODIUM CHLORIDE 0.9 % IV SOLN
Freq: Once | INTRAVENOUS | Status: AC
  Administered 2016-01-02: 12:00:00 via INTRAVENOUS

## 2016-01-02 NOTE — Evaluation (Signed)
Occupational Therapy Evaluation Patient Details Name: Glenetta Kiger MRN: [id number] DOB: Aug 20, 1955 Today's Date: 01/02/2016    History of Present Illness Lakeya Mulka  is a 60 y.o. female with a known history of ESRD, CHF, Depression, DVT, Htn, Was recently admitted in hospital with bacteremia and she had her permacath and med port taken out, and she was on antibiotics. Today morning she had some twitching movements in both her arms and she could not hold things with that, still was taken to her dialysis center by her husband where she was finished full course of dialysis but continued to have same twitching movements. MRI indicated a posterior circulation meningioma.   Clinical Impression   Pt is 60 year old female who presents with blurry vision and twitching movements in both arms with possible seizure activity. She does not present with any visual field deficits but has blurry vision mostly with distance.She has intact  coordination with gross and fine motor skills of BUEs and hands but has tingling in tips of fingers with pain when squeezing.  She also has pain in toes when standing and walking but this was not assessed since she was about to receive a blood transfusion and was weak.   Only set up needed for self feeding, grooming and UB dressing.  Unable to assess LB dressing but she presents with good active movement in LB and reports that she was independent prior to admission.  Strongly rec a shower chair in shower stall to prevent falls and remove all small throw rugs she has on her carpet areas. Pt would benefit from skilled OT services to address ADL training, balance training, adaptive equipment training, strengthening, and family ed and training.  Pt would benefit from OT [facility] for continued rehab after discharge from hospital.    Follow Up Recommendations  Home health OT    Equipment Recommendations  Tub/shower seat    Recommendations for Other Services       Precautions  / Restrictions Precautions Precautions: Fall Precaution Comments: pain with tingling in finger tips and toes making ambulation painful and slow  Restrictions Weight Bearing Restrictions: No      Mobility Bed Mobility                  Transfers                      Balance                                            ADL Overall ADL's : Needs assistance/impaired                                       General ADL Comments: Pt about to receive a blood transfusion so only ADLs in bed assessed.  She is able to complete grooming and feeding skills after set up using L dominant hand.  She has weakness in both hands and UEs but able to use for UB dressing and grooming and feeding.  WHen seizures occurred, she was not able to do anything including holding a wash cloth.  Unable to assess mobiility or toilet transfers but NSG indiated she needs min assist and supervision since ambulation is painful and slow due to pain and tingling in feet.  Will assess further tomorrow for LB dressing skills.  Pt with good acitve movement in LB in bed and was dressing herself and showering Independently prior to admission.         Vision Vision Assessment?: Yes Eye Alignment: Within Functional Limits Ocular Range of Motion: Within Functional Limits Alignment/Gaze Preference: Within Defined Limits Tracking/Visual Pursuits: Able to track stimulus in all quads without difficulty Saccades: Within functional limits Convergence: Within functional limits Visual Fields: No apparent deficits   Perception     Praxis      Pertinent Vitals/Pain Pain Assessment: No/denies pain     Hand Dominance Left   Extremity/Trunk Assessment Upper Extremity Assessment Upper Extremity Assessment: Generalized weakness   Lower Extremity Assessment Lower Extremity Assessment: Defer to PT evaluation       Communication Communication Communication: No difficulties    Cognition Arousal/Alertness: Awake/alert Behavior During Therapy: WFL for tasks assessed/performed Overall Cognitive Status: Within Functional Limits for tasks assessed                     General Comments       Exercises       Shoulder Instructions      Home Living Family/patient expects to be discharged to:: Private residence Living Arrangements: Spouse/significant other Available Help at Discharge: Family Type of Home: House Home Access: Stairs to enter Technical brewer of Steps: 3 Entrance Stairs-Rails: Right Home Layout: One level     Bathroom Shower/Tub: English as a second language teacher characteristics: Door Biochemist, clinical: Standard Bathroom Accessibility: Yes How Accessible: Accessible via walker Home Equipment: Gilford Rile - 2 wheels          Prior Functioning/Environment Level of Independence: Independent with assistive device(s)                 OT Problem List: Decreased strength;Decreased activity tolerance;Decreased knowledge of use of DME or AE   OT Treatment/Interventions: Self-care/ADL training;Patient/family education;Therapeutic activities;Balance training    OT Goals(Current goals can be found in the care plan section) Acute Rehab OT Goals Patient Stated Goal: to go home OT Goal Formulation: With patient Time For Goal Achievement: 01/16/16 Potential to Achieve Goals: Good ADL Goals Pt Will Perform Lower Body Dressing: with supervision;sit to/from stand (with no LOB )  OT Frequency: Min 1X/week   Barriers to D/C:            Co-evaluation              End of Session Nurse Communication:  (pt to receive blood transfusion after OT eval)  Activity Tolerance: Patient limited by fatigue Patient left: in bed;with call bell/phone within reach;with bed alarm set   Time: 4142-3953 OT Time Calculation (min): 31 min Charges:  OT General Charges $OT Visit: 1 Procedure OT Evaluation $OT Eval Low Complexity: 1 Procedure OT  Treatments $Self Care/Home Management : 8-22 mins G-Codes:    Chrys Racer, OTR/L ascom [phone] 01/02/16, 10:53 AM

## 2016-01-02 NOTE — Care Management (Signed)
Referral made to [facility] last admission for SN, PT, CSW. [facility] unable to reach patient by phone. Notified by Corene Cornea with [facility].

## 2016-01-02 NOTE — Plan of Care (Signed)
Problem: Safety: Goal: Ability to remain free from injury will improve Outcome: Progressing No seizure activity overnight.

## 2016-01-02 NOTE — Progress Notes (Signed)
PT Cancellation Note  Patient Details Name: Teresa Cook MRN: [id number] DOB: Jul 01, 1955   Cancelled Treatment:    Reason Eval/Treat Not Completed: Medical issues which prohibited therapy (Consult received and chart reviewed.  Patient currently starting blood transfusion.  Will hold at this time and re-attempt at later time/date as medically appropriate and available.)  Kobie Whidby H. Owens Shark, PT, DPT, NCS 01/02/16, 12:16 PM [phone]

## 2016-01-02 NOTE — Progress Notes (Signed)
ANTICOAGULATION CONSULT NOTE - FOLLOW UP   Pharmacy Consult for Warfarin Indication: continuation of therapy for DVT  Allergies  Allergen Reactions  . Adhesive [Tape] Itching    Silk tape is ok to use.    Patient Measurements: Height: 5\' 6"  (167.6 cm) Weight: 129 lb 14.4 oz (58.9 kg) IBW/kg (Calculated) : 59.3 Heparin Dosing Weight:    Vital Signs: Temp: 98 F (36.7 C) (12/26 0849) Temp Source: Oral (12/26 0849) BP: 151/85 (12/26 0849) Pulse Rate: 89 (12/26 0849)  Labs:  Recent Labs  12/31/15 1634 01/01/16 0446 01/01/16 1740 01/02/16 0508  HGB 8.0* 7.1* 7.8* 7.2*  HCT 23.7* 21.2* 23.2* 21.3*  PLT 221 162 177 148*  LABPROT 22.9* 23.1*  --  24.2*  INR 1.99 2.01  --  2.13  CREATININE 2.62* 4.00*  --  6.04*  TROPONINI 0.04*  --   --   --     Estimated Creatinine Clearance: 9.1 mL/min (by C-G formula based on SCr of 6.04 mg/dL (H)).   Medical History: Past Medical History:  Diagnosis Date  . Anemia   . Anxiety   . Breast cancer ([place])   . CHF (congestive heart failure) ([place])   . Chronic kidney disease   . Depression   . Dialysis patient ([place])   . DVT (deep venous thrombosis) ([facility])    left leg  . DVT (deep venous thrombosis) ([place])   . Gout   . HTN (hypertension)   . Hypertension   . Parathyroid abnormality ([place])   . Parathyroid disease ([place])   . Psoriasis   . Renal insufficiency   . Sickle cell anemia ([facility])    traits     Assessment: Pharmacy consulted to dose Warfarin for continuation of therapy. Patient was taking 5mg  on Friday, Saturday, and Sunday and 7.5mg  Monday through Thursday.  Goal of Therapy:  INR 2-3 Monitor platelets by anticoagulation protocol: Yes   Plan:  Will INR therapeutic at 2.13.  this morning. Will continue patients home regimen of Warfarin 5mg  Friday, Saturday Sunday, and Warfarin 7.5mg  Monday-Thursday. F/U on INR with am labs.   Larene Beach, PharmD, BCPS Clinical Pharmacist 01/02/2016 9:42 AM

## 2016-01-02 NOTE — Progress Notes (Signed)
Pt in room with husband. Pt requested prayer for family. CH offered prayer.   01/02/16 1140  Clinical Encounter Type  Visited With Patient and family together  Visit Type Initial  Referral From Nurse  Spiritual Encounters  Spiritual Needs Prayer;Emotional  Stress Factors  Patient Stress Factors None identified  Family Stress Factors None identified

## 2016-01-02 NOTE — Progress Notes (Signed)
[facility]  ROUNDING NOTE   Subjective:   Laying bed. No seizure activity.  Hemodialysis Sunday.   Objective:  Vital signs in last 24 hours:  Temp:  [98 F (36.7 C)-99.3 F (37.4 C)] 98 F (36.7 C) (12/26 0849) Pulse Rate:  [78-102] 89 (12/26 0849) Resp:  [18-22] 18 (12/26 0849) BP: (100-151)/(60-93) 151/85 (12/26 0849) SpO2:  [91 %-100 %] 97 % (12/26 0849)  Weight change:  Filed Weights   12/31/15 2149  Weight: 58.9 kg (129 lb 14.4 oz)    Intake/Output: I/O last 3 completed shifts: In: 1031.5 [P.O.:580; I.V.:3; Blood:351; IV Piggyback:97.5] Out: 0    Intake/Output this shift:  No intake/output data recorded.  Physical Exam: General: No acute distress  ENT Moist oral mucus membranes  Eyes: anicteric  Neck: supple  Lungs:  Clear to auscultation bilateral  Heart: Irregular  Abdomen:  Soft, nontender, BS present  Extremities: no peripheral edema  Neurologic: Awake, alert, follows commands  Skin: No lesions  Access: Right femoral permcath 09/98/33    Basic Metabolic Panel:  Recent Labs Lab 12/28/15 0443 12/31/15 1634 01/01/16 0446 01/02/16 0508  NA  --  136 137 136  K  --  3.5 3.4* 3.8  CL  --  100* 103 103  CO2  --  '24 26 25  ' GLUCOSE  --  103* 88 92  BUN  --  9 13 23*  CREATININE  --  2.62* 4.00* 6.04*  CALCIUM  --  9.5 8.9 9.1  MG  --  1.8 1.9  --   PHOS 5.3*  --   --   --     Liver Function Tests:  Recent Labs Lab 12/31/15 1634  AST 29  ALT 15  ALKPHOS 189*  BILITOT 0.6  PROT 6.9  ALBUMIN 3.2*   No results for input(s): LIPASE, AMYLASE in the last 168 hours.  Recent Labs Lab 12/31/15 1854  AMMONIA 9    CBC:  Recent Labs Lab 12/28/15 0443 12/31/15 1634 01/01/16 0446 01/01/16 1740 01/02/16 0508  WBC 11.6* 10.8 7.0 6.4 4.9  HGB 8.2* 8.0* 7.1* 7.8* 7.2*  HCT 24.8* 23.7* 21.2* 23.2* 21.3*  MCV 84.2 85.9 86.0 86.1 85.6  PLT 227 221 162 177 148*    Cardiac Enzymes:  Recent Labs Lab 12/26/15 1516  12/26/15 2026 12/27/15 0512 12/31/15 1634  TROPONINI 0.18* 0.17* 0.15* 0.04*    BNP: Invalid input(s): POCBNP  CBG: No results for input(s): GLUCAP in the last 168 hours.  Microbiology: Results for orders placed or performed during the hospital encounter of 12/31/15  CULTURE, BLOOD (ROUTINE X 2) w Reflex to ID Panel     Status: None (Preliminary result)   Collection Time: 12/31/15 10:10 PM  Result Value Ref Range Status   Specimen Description BLOOD LEFT ANTECUBITAL  Final   Special Requests   Final    BOTTLES DRAWN AEROBIC AND ANAEROBIC 14CCAERO,15CCANA   Culture NO GROWTH 2 DAYS  Final   Report Status PENDING  Incomplete  CULTURE, BLOOD (ROUTINE X 2) w Reflex to ID Panel     Status: None (Preliminary result)   Collection Time: 12/31/15 10:10 PM  Result Value Ref Range Status   Specimen Description BLOOD RIGHT HAND  Final   Special Requests BOTTLES DRAWN AEROBIC AND ANAEROBIC [place]  Final   Culture NO GROWTH 2 DAYS  Final   Report Status PENDING  Incomplete    Coagulation Studies:  Recent Labs  12/31/15 1634 01/01/16 0446 01/02/16 8250  LABPROT 22.9* 23.1* 24.2*  INR 1.99 2.01 2.13    Urinalysis: No results for input(s): COLORURINE, LABSPEC, PHURINE, GLUCOSEU, HGBUR, BILIRUBINUR, KETONESUR, PROTEINUR, UROBILINOGEN, NITRITE, LEUKOCYTESUR in the last 72 hours.  Invalid input(s): APPERANCEUR    Imaging: Ct Head Wo Contrast  Result Date: 12/31/2015 CLINICAL DATA:  Seizure after dialysis. EXAM: CT HEAD WITHOUT CONTRAST TECHNIQUE: Contiguous axial images were obtained from the base of the skull through the vertex without intravenous contrast. COMPARISON:  02/02/2014 FINDINGS: Brain: No evidence of acute infarction, hemorrhage, hydrocephalus, or extra-axial collection. 1.5 x 1.5 cm partially calcified extra-axial mass seen along the right tentorium, abutting the right cerebellar cortex. The mass is not well visualized, however appears increased from the prior CT  dated 02/02/2014, when it measured 1.4 x 1.1 cm. Vascular: No hyperdense vessels. Calcific atherosclerotic disease at the skullbase. Skull: Negative for fracture or focal lesion. Hyperdense appearance of the osseous structures, likely related to chronic renal disease. Sinuses/Orbits: Polypoid mucosal thickening of the right maxillary sinus and right sphenoid sinus. Minimal polypoid mucosal thickening of bilateral ethmoid sinuses. Partial opacification of the bilateral mastoid air cells. Other: None IMPRESSION: No evident of acute infarction or hemorrhage. Apparent increase in the size of known partially calcified extra-axial mass along the right tentorium. Nonemergent evaluation with contrast-enhanced brain MRI may provide better characterisation of this lesion. Electronically Signed   By: Fidela Salisbury M.D.   On: 12/31/2015 17:24   Mr Brain Wo Contrast  Result Date: 01/01/2016 CLINICAL DATA:  Possible seizure. Fall, hitting the back of the head. History of end-stage renal disease on dialysis. EXAM: MRI HEAD WITHOUT CONTRAST TECHNIQUE: Multiplanar, multiecho pulse sequences of the brain and surrounding structures were obtained without intravenous contrast. COMPARISON:  Head CTs 12/31/2015 and earlier FINDINGS: Brain: The study is mildly motion degraded. The mesial temporal lobe structures are normal in appearance within this limitation. There is no evidence of acute infarct, intracranial hemorrhage, midline shift, or extra-axial fluid collection. The ventricles and sulci are normal in size for age. Periventricular and scattered subcortical white matter T2 hyperintensities are nonspecific but compatible with minimal chronic small vessel ischemic disease. Intermediate to low T1 and T2 signal mass along the undersurface of the posterior tentorium in/just right of midline measures approximately 1.7 x 1.0 x 1.5 cm, partially calcified on CT and at most minimally increased in size from a 10/04/2013 CT. There is  no associated cerebellar edema. Vascular: Major intracranial vascular flow voids are preserved. Skull and upper cervical spine: Diffusely diminished bone marrow signal intensity which may relate to patient's chronic renal failure and anemia. Sinuses/Orbits: Minimal bilateral ethmoid and mild right sphenoid and right maxillary sinus mucosal thickening. Moderate bilateral mastoid effusions. Other: 1.2 cm subcutaneous nodule in the midline of the frontal scalp, benign in appearance and possibly a sebaceous cyst. IMPRESSION: 1. No acute intracranial abnormality or seizure focus identified. 2. 1.7 cm posterior fossa mass most consistent with meningioma. No cerebellar edema. 3. Minimal chronic small vessel ischemic disease. Electronically Signed   By: Logan Bores M.D.   On: 01/01/2016 12:09   Dg Foot Complete Right  Result Date: 12/31/2015 CLINICAL DATA:  Status post fall.  Unable to bear weight. EXAM: RIGHT FOOT COMPLETE - 3+ VIEW COMPARISON:  None. FINDINGS: Moderate to severe osteopenia. No evidence of displaced fractures seen radiographically. Mild soft tissue swelling. IMPRESSION: No evidence of displaced fractures seen radiographically. However fractures could be easily overlooked in the settings of osteopenia, and therefore if the patient continues to be symptomatic  CT of the foot may be considered. Electronically Signed   By: Fidela Salisbury M.D.   On: 12/31/2015 17:14     Medications:    . sodium chloride   Intravenous Once  . aspirin EC  81 mg Oral Daily  . feeding supplement (ENSURE ENLIVE)  237 mL Oral TID BM  . heparin  5,000 Units Subcutaneous Q8H  . levETIRAcetam  500 mg Oral Daily  . losartan  100 mg Oral Once per day on Sun Tue Thu Sat  . metoprolol  50 mg Oral 2 times per day on Sun Tue Thu Sat  . pantoprazole  40 mg Oral BID AC  . sertraline  100 mg Oral Daily  . sodium chloride flush  3 mL Intravenous Q12H  . [START ON 01/05/2016] warfarin  5 mg Oral Once per day on Sun Fri  Sat   And  . warfarin  7.5 mg Oral Once per day on Mon Tue Wed Thu  . Warfarin - Pharmacist Dosing Inpatient   Does not apply q1800   acetaminophen, ALPRAZolam, benzonatate, diphenhydrAMINE, levETIRAcetam, senna-docusate, [START ON 01/03/2016] vancomycin, zolpidem  Assessment/ Plan:  Teresa Cook is a 60 y.o. 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   [facility]/MWF/[address] Right femoral permcath  1. ESRD on HD MWF: No acute indication for dialysis.  Next treatment for tomorrow.  2. Hypertension: some elevations. With atrial fibrillation - metoprolol and losartan  3. Anemia of chronic kidney disease: hemoglobin 7.2 - Avoid Epogen given recent diagnosis of breast cancer and new onset seizure.  4. Secondary hyperparathyroidism. - currently not on binders.   5. Seizure: appreciate neurology input. Started on levetiracetam which is dialyzed so she will need a second dose after treatment.      LOS: [place], [place] 12/26/201711:06 AM

## 2016-01-02 NOTE — Progress Notes (Signed)
[facility] [place] NAME: Teresa Cook    MR#:  [id number]  DATE OF BIRTH:  1955-12-13  SUBJECTIVE:  CHIEF COMPLAINT:   Chief Complaint  Patient presents with  . Seizures  Feels weak, tired, hemoglobin dropped to 7.2 REVIEW OF SYSTEMS:  Review of Systems  Constitutional: Positive for malaise/fatigue. Negative for chills, fever and weight loss.  HENT: Negative for nosebleeds and sore throat.   Eyes: Negative for blurred vision.  Respiratory: Negative for cough, shortness of breath and wheezing.   Cardiovascular: Negative for chest pain, orthopnea, leg swelling and PND.  Gastrointestinal: Negative for abdominal pain, constipation, diarrhea, heartburn, nausea and vomiting.  Genitourinary: Negative for dysuria and urgency.  Musculoskeletal: Positive for back pain, falls, joint pain and myalgias.  Skin: Negative for rash.  Neurological: Positive for seizures and weakness. Negative for dizziness, speech change, focal weakness and headaches.  Endo/Heme/Allergies: Does not bruise/bleed easily.  Psychiatric/Behavioral: Negative for depression.   DRUG ALLERGIES:   Allergies  Allergen Reactions  . Adhesive [Tape] Itching    Silk tape is ok to use.   VITALS:  Blood pressure 134/80, pulse 78, temperature 98.7 F (37.1 C), temperature source Oral, resp. rate 18, height 5\' 6"  (1.676 m), weight 58.9 kg (129 lb 14.4 oz), SpO2 98 %. PHYSICAL EXAMINATION:  Physical Exam  Constitutional: She is oriented to person, place, and time and well-developed, well-nourished, and in no distress.  HENT:  Head: Normocephalic and atraumatic.  Eyes: Conjunctivae and EOM are normal. Pupils are equal, round, and reactive to light.  Neck: Normal range of motion. Neck supple. No tracheal deviation present. No thyromegaly present.  Cardiovascular: Normal rate, regular rhythm and normal heart sounds.   Pulmonary/Chest: Effort normal and breath sounds normal. No  respiratory distress. She has no wheezes. She exhibits no tenderness.  Abdominal: Soft. Bowel sounds are normal. She exhibits no distension. There is no tenderness.  Musculoskeletal: Normal range of motion.  Neurological: She is alert and oriented to person, place, and time. No cranial nerve deficit.  Skin: Skin is warm and dry. No rash noted.  Psychiatric: Mood and affect normal.   LABORATORY PANEL:   CBC  Recent Labs Lab 01/02/16 0508  WBC 4.9  HGB 7.2*  HCT 21.3*  PLT 148*   ------------------------------------------------------------------------------------------------------------------ Chemistries   Recent Labs Lab 12/31/15 1634 01/01/16 0446 01/02/16 0508  NA 136 137 136  K 3.5 3.4* 3.8  CL 100* 103 103  CO2 24 26 25   GLUCOSE 103* 88 92  BUN 9 13 23*  CREATININE 2.62* 4.00* 6.04*  CALCIUM 9.5 8.9 9.1  MG 1.8 1.9  --   AST 29  --   --   ALT 15  --   --   ALKPHOS 189*  --   --   BILITOT 0.6  --   --    RADIOLOGY:  No results found. ASSESSMENT AND PLAN:   * Seizures - continue Keppra twice a day.   MRI on the brain Negative for any acute pathology.   Neurology recommended to continue Keppra  * Anemia of chronic kidney disease - Hemoglobin down to 7.2.  Even after 1 unit of packed red blood cells transfusion - We will check stool for Hemoccult and anemia panel - Consult GI but There is no coverage today  * Hypokalemia - Replete and resolved  * End-stage renal disease on hemodialysis    Nephrology consult for HD  * Recent bacteremia  Check Blood culture, she was suppose to finish Abx on 12/23 as per last Progress notes in last admission.  * DVT  On Coumadin- Adjust per pharmacy. - INR 2.01  * Hypertension - continue Metoprolol, Losartan.  * Depression   Zoloft.     All the records are reviewed and case discussed with Care Management/Social Worker. Management plans discussed with the patient, family and they are in agreement.  CODE  STATUS: Full code  TOTAL TIME TAKING CARE OF THIS PATIENT: 35 minutes.   More than 50% of the time was spent in counseling/coordination of care: YES  POSSIBLE D/C IN 1-2 DAYS, DEPENDING ON CLINICAL CONDITION.   Max Sane M.D on 01/02/2016 at 12:51 PM  Between 7am to 6pm - Pager - [phone]  After 6pm go to [email or web address] - Proofreader  [facility]  Office  [phone]  CC: Primary care physician; Murlean Iba, MD  Note: This dictation was prepared with Dragon dictation along with smaller phrase technology. Any transcriptional errors that result from this process are unintentional.

## 2016-01-02 NOTE — Progress Notes (Signed)
Two hours into the blood patient complaint of some middle chest discomfort and uncomfortable when breathing.  I collected a set of vitals signs and they were normal RR was elevated to 24.  I sat the patient up more in the bed and the discomfort did resolve some.  I notified Dr. Manuella Ghazi while he was on the unit and he just acknowledged, no new orders.

## 2016-01-03 DIAGNOSIS — D649 Anemia, unspecified: Secondary | ICD-10-CM

## 2016-01-03 LAB — CBC
HEMATOCRIT: 25.4 % — AB (ref 35.0–47.0)
HEMATOCRIT: 27.6 % — AB (ref 35.0–47.0)
Hemoglobin: 8.7 g/dL — ABNORMAL LOW (ref 12.0–16.0)
Hemoglobin: 9.3 g/dL — ABNORMAL LOW (ref 12.0–16.0)
MCH: 29.4 pg (ref 26.0–34.0)
MCH: 28.4 pg (ref 26.0–34.0)
MCHC: 34.3 g/dL (ref 32.0–36.0)
MCHC: 33.5 g/dL (ref 32.0–36.0)
MCV: 85.9 fL (ref 80.0–100.0)
MCV: 85 fL (ref 80.0–100.0)
PLATELETS: 151 10*3/uL (ref 150–440)
Platelets: 174 10*3/uL (ref 150–440)
RBC: 2.96 MIL/uL — AB (ref 3.80–5.20)
RBC: 3.25 MIL/uL — AB (ref 3.80–5.20)
RDW: 17.5 % — ABNORMAL HIGH (ref 11.5–14.5)
RDW: 17.3 % — AB (ref 11.5–14.5)
WBC: 5.4 10*3/uL (ref 3.6–11.0)
WBC: 5.5 10*3/uL (ref 3.6–11.0)

## 2016-01-03 LAB — RENAL FUNCTION PANEL
Albumin: 3 g/dL — ABNORMAL LOW (ref 3.5–5.0)
Anion gap: 9 (ref 5–15)
BUN: 41 mg/dL — AB (ref 6–20)
CHLORIDE: 102 mmol/L (ref 101–111)
CO2: 23 mmol/L (ref 22–32)
Calcium: 8.9 mg/dL (ref 8.9–10.3)
Creatinine, Ser: 8.48 mg/dL — ABNORMAL HIGH (ref 0.44–1.00)
GFR calc Af Amer: 5 mL/min — ABNORMAL LOW (ref >60)
GFR, EST NON AFRICAN AMERICAN: 4 mL/min — AB (ref >60)
Glucose, Bld: 93 mg/dL (ref 65–99)
POTASSIUM: 5.9 mmol/L — AB (ref 3.5–5.1)
Phosphorus: 5.3 mg/dL — ABNORMAL HIGH (ref 2.5–4.6)
Sodium: 134 mmol/L — ABNORMAL LOW (ref 135–145)

## 2016-01-03 LAB — BASIC METABOLIC PANEL
Anion gap: 9 (ref 5–15)
BUN: 36 mg/dL — AB (ref 6–20)
CHLORIDE: 102 mmol/L (ref 101–111)
CO2: 24 mmol/L (ref 22–32)
Calcium: 9 mg/dL (ref 8.9–10.3)
Creatinine, Ser: 7.68 mg/dL — ABNORMAL HIGH (ref 0.44–1.00)
GFR calc Af Amer: 6 mL/min — ABNORMAL LOW (ref >60)
GFR, EST NON AFRICAN AMERICAN: 5 mL/min — AB (ref >60)
GLUCOSE: 85 mg/dL (ref 65–99)
POTASSIUM: 4.2 mmol/L (ref 3.5–5.1)
Sodium: 135 mmol/L (ref 135–145)

## 2016-01-03 MED ORDER — PANTOPRAZOLE SODIUM 40 MG IV SOLR: 40.0000 mg | INTRAVENOUS | Status: DC

## 2016-01-03 MED ORDER — ONDANSETRON HCL 4 MG/2ML IJ SOLN
4.0000 mg | Freq: Four times a day (QID) | INTRAMUSCULAR | Status: DC | PRN
  Administered 2016-01-03: 4 mg via INTRAVENOUS
  Filled 2016-01-03: qty 2

## 2016-01-03 MED ORDER — LEVETIRACETAM 500 MG PO TABS
500.0000 mg | ORAL_TABLET | Freq: Once | ORAL | Status: AC
  Administered 2016-01-03: 500 mg via ORAL

## 2016-01-03 MED ORDER — TRAMADOL HCL 50 MG PO TABS
50.0000 mg | ORAL_TABLET | Freq: Once | ORAL | Status: AC
  Administered 2016-01-03: 50 mg via ORAL
  Filled 2016-01-03: qty 1

## 2016-01-03 MED ORDER — LEVETIRACETAM 500 MG PO TABS: 500.0000 mg | ORAL_TABLET | Freq: Two times a day (BID) | ORAL | 0 refills | Status: DC

## 2016-01-03 NOTE — Progress Notes (Signed)
Post hd vitals 

## 2016-01-03 NOTE — Progress Notes (Signed)
Pre hd info 

## 2016-01-03 NOTE — Progress Notes (Signed)
01/03/2016 6:40 PM  [name] to be D/C'd Home per MD order.  Discussed prescriptions and follow up appointments with the patient. Prescriptions given to patient, medication list explained in detail. Pt verbalized understanding.  Allergies as of 01/03/2016      Reactions   Adhesive [tape] Itching   Silk tape is ok to use.      Medication List    TAKE these medications   acetaminophen 325 MG tablet Commonly known as:  TYLENOL Take 2 tablets (650 mg total) by mouth every 6 (six) hours as needed for mild pain (or Fever >/= 101).   ALPRAZolam 0.5 MG tablet Commonly known as:  XANAX Take 1 tablet (0.5 mg total) by mouth 2 (two) times daily as needed for anxiety.   benzonatate 200 MG capsule Commonly known as:  TESSALON Take 200 mg by mouth 3 (three) times daily as needed for cough.   diphenhydrAMINE 25 mg capsule Commonly known as:  BENADRYL Take 1 capsule (25 mg total) by mouth every 8 (eight) hours as needed for itching.   feeding supplement (ENSURE ENLIVE) Liqd Take 237 mLs by mouth 3 (three) times daily between meals.   feeding supplement (NEPRO CARB STEADY) Liqd Take 237 mLs by mouth 2 (two) times daily between meals.   levETIRAcetam 500 MG tablet Commonly known as:  KEPPRA Take 1 tablet (500 mg total) by mouth 2 (two) times daily.   losartan 50 MG tablet Commonly known as:  COZAAR Take 100 mg by mouth daily. Takes on non-dialysis days=Tuesday, Thursday, Saturday and Sunday.   metoprolol 100 MG tablet Commonly known as:  LOPRESSOR Take 0.5 tablets (50 mg total) by mouth 2 (two) times daily. Takes on non-dialysis days=Tuesday, Thursday, Saturday and Sunday.   senna-docusate 8.6-50 MG tablet Commonly known as:  Senokot-S Take 1 tablet by mouth at bedtime as needed for mild constipation.   sertraline 100 MG tablet Commonly known as:  ZOLOFT Take 100 mg by mouth daily.   traMADol 50 MG tablet Commonly known as:  ULTRAM Take 1 tablet (50 mg total) by mouth  every 6 (six) hours as needed for moderate pain.   warfarin 5 MG tablet Commonly known as:  COUMADIN Take 1 tablet Friday, Saturday and Sundays. Take 1 1/2 tablets the other days of the week   zolpidem 10 MG tablet Commonly known as:  AMBIEN Take 10 mg by mouth at bedtime as needed for sleep.       Vitals:   01/03/16 1615 01/03/16 1619  BP: 136/82 (!) 148/81  Pulse: 82 83  Resp: (!) 21 (!) 22  Temp: 98.8 F (37.1 C)     Skin clean, dry and intact without evidence of skin break down, no evidence of skin tears noted. IV catheter discontinued intact. Site without signs and symptoms of complications. Dressing and pressure applied. Pt denies pain at this time. No complaints noted.  An After Visit Summary was printed and given to the patient. Patient escorted via [place], and D/C home via private auto.  Teresa Cook

## 2016-01-03 NOTE — Progress Notes (Signed)
Post hd assessment 

## 2016-01-03 NOTE — Progress Notes (Signed)
ANTICOAGULATION CONSULT NOTE - FOLLOW UP   Pharmacy Consult for Warfarin Indication: continuation of therapy for DVT  Allergies  Allergen Reactions  . Adhesive [Tape] Itching    Silk tape is ok to use.    Patient Measurements: Height: 5\' 6"  (167.6 cm) Weight: 129 lb 14.4 oz (58.9 kg) IBW/kg (Calculated) : 59.3 Heparin Dosing Weight:    Vital Signs: Temp: 98.4 F (36.9 C) (12/27 0503) Temp Source: Oral (12/27 0503) BP: 134/82 (12/27 0503) Pulse Rate: 79 (12/27 0503)  Labs:  Recent Labs  12/31/15 1634 01/01/16 0446 01/01/16 1740 01/02/16 0508 01/03/16 0437  HGB 8.0* 7.1* 7.8* 7.2* 8.7*  HCT 23.7* 21.2* 23.2* 21.3* 25.4*  PLT 221 162 177 148* 151  LABPROT 22.9* 23.1*  --  24.2*  --   INR 1.99 2.01  --  2.13  --   CREATININE 2.62* 4.00*  --  6.04* 7.68*  TROPONINI 0.04*  --   --   --   --     Estimated Creatinine Clearance: 7.2 mL/min (by C-G formula based on SCr of 7.68 mg/dL (H)).   Medical History: Past Medical History:  Diagnosis Date  . Anemia   . Anxiety   . Breast cancer ([facility])   . CHF (congestive heart failure) ([facility])   . Chronic kidney disease   . Depression   . Dialysis patient ([facility])   . DVT (deep venous thrombosis) ([facility])    left leg  . DVT (deep venous thrombosis) ([facility] [facility])   . Gout   . HTN (hypertension)   . Hypertension   . Parathyroid abnormality ([facility])   . Parathyroid disease ([facility])   . Psoriasis   . Renal insufficiency   . Sickle cell anemia ([facility])    traits     Assessment: Pharmacy consulted to dose Warfarin for continuation of therapy. Patient was taking 5mg  on Friday, Saturday, and Sunday and 7.5mg  Monday through Thursday.  Goal of Therapy:  INR 2-3 Monitor platelets by anticoagulation protocol: Yes   Plan:  INR therapeutic at 2.13 on 12/26. Will continue patients home regimen of Warfarin 5mg  Friday, Saturday Sunday, and Warfarin 7.5mg  Monday-Thursday. F/U on INR with am labs.   Larene Beach, PharmD, BCPS Clinical  Pharmacist 01/03/2016 9:37 AM

## 2016-01-03 NOTE — Care Management (Signed)
Patient admitted with sz.  Patient lives at home with husband.  [facility]. I have confirmed with Gwinda Passe from [facility].  Resumption orders have been placed.  B[facility]y notified of discharge.  HD records faxed to [facility] liaison. RNCM singing off

## 2016-01-03 NOTE — Discharge Instructions (Signed)
Please take her Keppra twice daily as prescribed. Please follow-up with neurology by calling the number provided to arrange a follow-up appointment as soon as possible. Return to the department for any further seizures, or any other symptom personally concerning to yourself.

## 2016-01-03 NOTE — Progress Notes (Signed)
OT Cancellation Note  Patient Details Name: Dawson Hollman MRN: [id number] DOB: [birth year]   Cancelled Treatment:    Reason Eval/Treat Not Completed: Patient at procedure or test/ unavailable Pt unable to be seen for OT treatment due to being at dialysis.  Will attempt again later today or tomorrow.  Chrys Racer, OTR/L ascom [phone] 01/03/16, 2:37 PM

## 2016-01-03 NOTE — Evaluation (Signed)
Physical Therapy Evaluation Patient Details Name: Madisson Kulaga MRN: [id number] DOB: 1955/09/06 Today's Date: 01/03/2016   History of Present Illness  presented to ER and admitted for seizure-like activity after dialysis.  MRI negative for acute change, but significant for chronic posterior circulation meningioma.  Clinical Impression  Upon evaluation, patient alert and oriented; follows all commands; flat affect, but cooperative with all evaluation components.  Bilat UE/LE strength and ROM grossly symmetrical and WFL for basic transfers and mobility; reports persistent pain in R foot (status post fall, x-rays negative) and plantar surface of bilat feet (baseline).  Able to complete bed mobility with mod indep; sit/stand, basic transfers and gait (60') with RW, cga.  Very slow and deliberate gait performance, heavy WBing bilat UEs; but no overt buckling or LOB. Unsafe to attempt without RW at this time; recommend continued use with all mobility.  Patient voiced understanding/awareness. Would benefit from skilled PT to address above deficits and promote optimal return to PLOF; recommend transition to STR upon discharge from acute hospitalization.     Follow Up Recommendations Home health PT    Equipment Recommendations   (has access to RW at home)    Recommendations for Other Services       Precautions / Restrictions Precautions Precautions: Fall Precaution Comments: R groin perm-cath Restrictions Weight Bearing Restrictions: No      Mobility  Bed Mobility Overal bed mobility: Modified Independent                Transfers Overall transfer level: Needs assistance Equipment used: Rolling walker (2 wheeled) Transfers: Sit to/from Stand Sit to Stand: Min guard            Ambulation/Gait Ambulation/Gait assistance: Min guard Ambulation Distance (Feet): 60 Feet Assistive device: Rolling walker (2 wheeled)     Gait velocity interpretation: <1.8 ft/sec, indicative  of risk for recurrent falls General Gait Details: reciprocal stepping pattern with decreased stance time R LE (due to foot pain).  Heavy WBing on RW; slow and deliberate (patient relates to tingling in plantar surface of bilat feet). Unsafe to attempt without RW at this time.  Stairs            Wheelchair Mobility    Modified Rankin (Stroke Patients Only)       Balance Overall balance assessment: Needs assistance Sitting-balance support: No upper extremity supported;Feet supported Sitting balance-Leahy Scale: Good     Standing balance support: Bilateral upper extremity supported Standing balance-Leahy Scale: Fair                               Pertinent Vitals/Pain Pain Assessment: Faces Faces Pain Scale: Hurts little more Pain Location: R foot, plantar surface bilat feet Pain Descriptors / Indicators: Aching;Tingling Pain Intervention(s): Limited activity within patient's tolerance;Monitored during session;Repositioned    Home Living Family/patient expects to be discharged to:: Private residence Living Arrangements: Spouse/significant other Available Help at Discharge: Family Type of Home: House Home Access: Stairs to enter Entrance Stairs-Rails: None Entrance Stairs-Number of Steps: 3 Home Layout: One level        Prior Function           Comments: Indep with ADLs, household and limited community mobility; husband transports to/from dialysis.     Hand Dominance        Extremity/Trunk Assessment   Upper Extremity Assessment Upper Extremity Assessment: Generalized weakness    Lower Extremity Assessment Lower Extremity Assessment: Generalized weakness (grossly at  least 4-/5 throughout; does endorse baseline neuropathy/tingling in plantar surface bilat feet after recent chemo treatment (unchanged with this admission))       Communication   Communication: No difficulties  Cognition Arousal/Alertness: Awake/alert Behavior During  Therapy: WFL for tasks assessed/performed;Flat affect Overall Cognitive Status: Within Functional Limits for tasks assessed                      General Comments      Exercises     Assessment/Plan    PT Assessment Patient needs continued PT services  PT Problem List Decreased strength;Decreased activity tolerance;Decreased balance;Decreased mobility;Decreased safety awareness;Decreased knowledge of precautions;Decreased knowledge of use of DME;Pain          PT Treatment Interventions Gait training;DME instruction;Therapeutic exercise;Stair training;Functional mobility training;Therapeutic activities;Balance training;Patient/family education    PT Goals (Current goals can be found in the Care Plan section)  Acute Rehab PT Goals Patient Stated Goal: to go home PT Goal Formulation: With patient Time For Goal Achievement: 01/17/16 Potential to Achieve Goals: Good    Frequency Min 2X/week   Barriers to discharge        Co-evaluation               End of Session Equipment Utilized During Treatment: Gait belt Activity Tolerance: Patient limited by fatigue Patient left: in bed;with bed alarm set (seated edge of bed; RN informed/aware) Nurse Communication: Mobility status         Time: 5038-8828 PT Time Calculation (min) (ACUTE ONLY): 14 min   Charges:   PT Evaluation $PT Eval Low Complexity: 1 Procedure     PT G Codes:        Lisbet Busker H. Owens Shark, PT, DPT, NCS 01/03/16, 1:54 PM [phone]

## 2016-01-03 NOTE — Progress Notes (Signed)
Pre hd assessment  

## 2016-01-03 NOTE — Care Management Important Message (Signed)
Important Message  Patient Details  Name: Teresa Cook MRN: [id number] Date of Birth: December 25, 1955   Medicare Important Message Given:  N/A - LOS <3 / Initial given by admissions    Beverly Sessions, RN 01/03/2016, 4:50 PM

## 2016-01-03 NOTE — Progress Notes (Signed)
Hd start 

## 2016-01-03 NOTE — Consult Note (Signed)
Jonathon Bellows MD  [address] [place], [place] [address] Phone: [phone] Fax : [phone]  Consultation  Referring Provider:     No ref. provider found Primary Care Physician:  Murlean Iba, MD Primary Gastroenterologist:  Dr. Vicente Males         Reason for Consultation:     Anemia  Date of Admission:  12/31/2015 Date of Consultation:  01/03/2016         HPI:   Teresa Cook is a 60 y.o. female with ESRD on dialysis , CHF,HTB presented to the ER on 12/15/15 with fevers, cough,phlegn , chest tightness.during that admission she was intubated  Completed chemotherapy for breast cancer in 11/2015 for metastatic disease  .  She was again admitted on 12/31/15 with seizures . She is on coumadin for DVT.MRI head shows 1.7 cm posterior fossa mass consistent with meningioma.  We have been consulted for incidental drop in Hb. Her Hb runs at baseline of 7.0-  8.3 grams . On admission Hb noted at 7.1 grams was given 1 unit prbc and went upto 7.2 , today Hb is 8.7 . MCV is 85. B12,iron ,ferritin show no deficiency . Reticulocyte count is not elevated.   She denies any overt blood loss , denies any rectal bleeding . Notes her stool is brown in color, denies any vaginal bleeding or hematemesis. She says she has been short of breath since last admission, runs out of breath when she walks from the bed to the door, also says she has some cough. Her past medical history suggests sickle cell trait.   Past Medical History:  Diagnosis Date  . Anemia   . Anxiety   . Breast cancer ([facility])   . CHF (congestive heart failure) ([facility])   . Chronic kidney disease   . Depression   . Dialysis patient ([facility])   . DVT (deep venous thrombosis) ([facility])    left leg  . DVT (deep venous thrombosis) ([facility])   . Gout   . HTN (hypertension)   . Hypertension   . Parathyroid abnormality ([facility])   . Parathyroid disease ([facility])   . Psoriasis   . Renal insufficiency   . Sickle cell anemia ([facility])    traits    Past Surgical History:    Procedure Laterality Date  . ABDOMINAL HYSTERECTOMY    . APPENDECTOMY    . BREAST BIOPSY Left 10/28/2013   benign  . BREAST EXCISIONAL BIOPSY Left 2002   benign  . INSERTION OF DIALYSIS CATHETER  2014  . PARTIAL HYSTERECTOMY    . PERIPHERAL VASCULAR CATHETERIZATION N/A 12/25/2015   Procedure: Dialysis/Perma Catheter Insertion;  Surgeon: Algernon Huxley, MD;  Location: [facility];  Service: Cardiovascular;  Laterality: N/A;  . PORT-A-CATH REMOVAL N/A 12/20/2015   Procedure: REMOVAL PORT-A-CATH;  Surgeon: Hubbard Robinson, MD;  Location: [facility];  Service: General;  Laterality: N/A;  left    . PORTACATH PLACEMENT Left 08/21/2015   Procedure: INSERTION PORT-A-CATH;  Surgeon: Hubbard Robinson, MD;  Location: [facility];  Service: General;  Laterality: Left;    Prior to Admission medications   Medication Sig Start Date End Date Taking? Authorizing Provider  acetaminophen (TYLENOL) 325 MG tablet Take 2 tablets (650 mg total) by mouth every 6 (six) hours as needed for mild pain (or Fever >/= 101). 12/27/15  Yes [name], MD  ALPRAZolam (XANAX) 0.5 MG tablet Take 1 tablet (0.5 mg total) by mouth 2 (two) times daily as needed for anxiety. 12/27/15  Yes [name]  [name], MD  benzonatate (TESSALON) 200 MG capsule Take 200 mg by mouth 3 (three) times daily as needed for cough.   Yes Historical Provider, MD  diphenhydrAMINE (BENADRYL) 25 mg capsule Take 1 capsule (25 mg total) by mouth every 8 (eight) hours as needed for itching. 12/27/15  Yes [name], MD  losartan (COZAAR) 50 MG tablet Take 100 mg by mouth daily. Takes on non-dialysis days=Tuesday, Thursday, Saturday and Sunday. 09/07/14  Yes Historical Provider, MD  metoprolol (LOPRESSOR) 100 MG tablet Take 0.5 tablets (50 mg total) by mouth 2 (two) times daily. Takes on non-dialysis days=Tuesday, Thursday, Saturday and Sunday. 12/27/15  Yes [name], MD  senna-docusate (SENOKOT-S) 8.6-50 MG tablet Take 1 tablet by mouth at bedtime as  needed for mild constipation. 12/27/15  Yes [name], MD  sertraline (ZOLOFT) 100 MG tablet Take 100 mg by mouth daily.  03/05/12  Yes Historical Provider, MD  traMADol (ULTRAM) 50 MG tablet Take 1 tablet (50 mg total) by mouth every 6 (six) hours as needed for moderate pain. 12/27/15  Yes [name], MD  warfarin (COUMADIN) 5 MG tablet Take 1 tablet Friday, Saturday and Sundays. Take 1 1/2 tablets the other days of the week 11/03/13  Yes Historical Provider, MD  zolpidem (AMBIEN) 10 MG tablet Take 10 mg by mouth at bedtime as needed for sleep.   Yes Historical Provider, MD  feeding supplement, ENSURE ENLIVE, (ENSURE ENLIVE) LIQD Take 237 mLs by mouth 3 (three) times daily between meals. 12/27/15   [name], MD  levETIRAcetam (KEPPRA) 500 MG tablet Take 1 tablet (500 mg total) by mouth 2 (two) times daily. 12/31/15   Harvest Dark, MD  Nutritional Supplements (FEEDING SUPPLEMENT, NEPRO CARB STEADY,) LIQD Take 237 mLs by mouth 2 (two) times daily between meals. 12/28/15   [name], MD    Family History  Problem Relation Age of Onset  . Stroke Mother   . CVA Mother   . Hypertension Mother   . Hypertension Father   . Hypertension Sister   . Diabetes Sister   . Cancer Sister 38    Breast  . Stroke Brother   . Hypertension Brother   . Breast cancer Maternal Aunt 70     Social History  Substance Use Topics  . Smoking status: Never Smoker  . Smokeless tobacco: Never Used  . Alcohol use No    Allergies as of 12/31/2015 - Review Complete 12/31/2015  Allergen Reaction Noted  . Adhesive [tape] Itching 08/14/2015    Review of Systems:    All systems reviewed and negative except where noted in HPI.   Physical Exam:  Vital signs in last 24 hours: Temp:  [98 F (36.7 C)-98.7 F (37.1 C)] 98.4 F (36.9 C) (12/27 0503) Pulse Rate:  [75-90] 79 (12/27 0503) Resp:  [16-24] 20 (12/26 2031) BP: (129-151)/(80-93) 134/82 (12/27 0503) SpO2:  [94 %-98 %] 98 % (12/27 0503) Last BM  Date: 01/02/16 General:   Pleasant, cooperative in NAD Head:  Normocephalic and atraumatic. Eyes:   No icterus.   Conjunctiva pink. PERRLA. Ears:  Normal auditory acuity. Neck:  Supple; no masses or thyroidomegaly Lungs: Respirations even and unlabored. Lungs clear to auscultation bilaterally.   No wheezes, crackles, or rhonchi.  Heart:  Regular rate and rhythm;  Without murmur, clicks, rubs or gallops Abdomen:  Soft, nondistended, nontender. Normal bowel sounds. No appreciable masses or hepatomegaly.  No rebound or guarding.  Rectal:  Not performed. Neurologic:  Alert and oriented x3;  grossly normal neurologically. Psych:  Alert and cooperative. Normal affect.  LAB RESULTS:  Recent Labs  01/01/16 1740 01/02/16 0508 01/03/16 0437  WBC 6.4 4.9 5.4  HGB 7.8* 7.2* 8.7*  HCT 23.2* 21.3* 25.4*  PLT 177 148* 151   BMET  Recent Labs  01/01/16 0446 01/02/16 0508 01/03/16 0437  NA 137 136 135  K 3.4* 3.8 4.2  CL 103 103 102  CO2 26 25 24   GLUCOSE 88 92 85  BUN 13 23* 36*  CREATININE 4.00* 6.04* 7.68*  CALCIUM 8.9 9.1 9.0   LFT  Recent Labs  12/31/15 1634  PROT 6.9  ALBUMIN 3.2*  AST 29  ALT 15  ALKPHOS 189*  BILITOT 0.6   PT/INR  Recent Labs  01/01/16 0446 01/02/16 0508  LABPROT 23.1* 24.2*  INR 2.01 2.13    STUDIES: Mr Brain Wo Contrast  Result Date: 01/01/2016 CLINICAL DATA:  Possible seizure. Fall, hitting the back of the head. History of end-stage renal disease on dialysis. EXAM: MRI HEAD WITHOUT CONTRAST TECHNIQUE: Multiplanar, multiecho pulse sequences of the brain and surrounding structures were obtained without intravenous contrast. COMPARISON:  Head CTs 12/31/2015 and earlier FINDINGS: Brain: The study is mildly motion degraded. The mesial temporal lobe structures are normal in appearance within this limitation. There is no evidence of acute infarct, intracranial hemorrhage, midline shift, or extra-axial fluid collection. The ventricles and sulci  are normal in size for age. Periventricular and scattered subcortical white matter T2 hyperintensities are nonspecific but compatible with minimal chronic small vessel ischemic disease. Intermediate to low T1 and T2 signal mass along the undersurface of the posterior tentorium in/just right of midline measures approximately 1.7 x 1.0 x 1.5 cm, partially calcified on CT and at most minimally increased in size from a 10/04/2013 CT. There is no associated cerebellar edema. Vascular: Major intracranial vascular flow voids are preserved. Skull and upper cervical spine: Diffusely diminished bone marrow signal intensity which may relate to patient's chronic renal failure and anemia. Sinuses/Orbits: Minimal bilateral ethmoid and mild right sphenoid and right maxillary sinus mucosal thickening. Moderate bilateral mastoid effusions. Other: 1.2 cm subcutaneous nodule in the midline of the frontal scalp, benign in appearance and possibly a sebaceous cyst. IMPRESSION: 1. No acute intracranial abnormality or seizure focus identified. 2. 1.7 cm posterior fossa mass most consistent with meningioma. No cerebellar edema. 3. Minimal chronic small vessel ischemic disease. Electronically Signed   By: Logan Bores M.D.   On: 01/01/2016 12:09      Impression / Plan:   Teresa Cook is a 60 y.o. y/o female with h/o ESRD, metastatic breast cancer , sickle cell trait, recent admission for respiratory failure requiring intubation . We have been consulted for anemia. An incidental drop in HB with no overt bleeding . She is on Coumadin for a DVT.   It is possible the drop in Hb is due to changes in body fluids. With no overt bleeding and present complaints of shortness of breath on minimal exertion , would not perform endoscopy at this time. If she does not have any overt GI bleed and Hb is stable  then would revaluate as an outpatient . If she has an overt bleed and Hb drops then will need coumadin to be held, may benefit from Tagged  RBC scan while we wait for INR to be < 1.5. Commence on PPI  Thank you for involving me in the care of this patient.      LOS: 2 days  Jonathon Bellows, MD  01/03/2016, 8:20 AM

## 2016-01-03 NOTE — Progress Notes (Signed)
  End of hd 

## 2016-01-03 NOTE — Progress Notes (Signed)
[facility]  ROUNDING NOTE   Subjective:   No more seizure activity. Dialysis for later today.   PRBC transfusion yesterday  Objective:  Vital signs in last 24 hours:  Temp:  [98.3 F (36.8 C)-98.7 F (37.1 C)] 98.4 F (36.9 C) (12/27 0503) Pulse Rate:  [75-90] 79 (12/27 0503) Resp:  [16-24] 20 (12/26 2031) BP: (129-149)/(80-93) 134/82 (12/27 0503) SpO2:  [94 %-98 %] 98 % (12/27 0503)  Weight change:  Filed Weights   12/31/15 2149  Weight: 58.9 kg (129 lb 14.4 oz)    Intake/Output: I/O last 3 completed shifts: In: 674 [P.O.:360; I.V.:5; Blood:309] Out: 101 [Urine:100; Stool:1]   Intake/Output this shift:  No intake/output data recorded.  Physical Exam: General: No acute distress  ENT Moist oral mucus membranes  Eyes: anicteric  Neck: supple  Lungs:  Clear to auscultation bilateral  Heart: Irregular  Abdomen:  Soft, nontender, BS present  Extremities: no peripheral edema  Neurologic: Awake, alert, follows commands  Skin: No lesions  Access: Right femoral permcath 14/43/15    Basic Metabolic Panel:  Recent Labs Lab 12/28/15 0443  12/31/15 1634 01/01/16 0446 01/02/16 0508 01/03/16 0437  NA  --   --  136 137 136 135  K  --   --  3.5 3.4* 3.8 4.2  CL  --   --  100* 103 103 102  CO2  --   --  '24 26 25 24  ' GLUCOSE  --   --  103* 88 92 85  BUN  --   --  9 13 23* 36*  CREATININE  --   --  2.62* 4.00* 6.04* 7.68*  CALCIUM  --   < > 9.5 8.9 9.1 9.0  MG  --   --  1.8 1.9  --   --   PHOS 5.3*  --   --   --   --   --   < > = values in this interval not displayed.  Liver Function Tests:  Recent Labs Lab 12/31/15 1634  AST 29  ALT 15  ALKPHOS 189*  BILITOT 0.6  PROT 6.9  ALBUMIN 3.2*   No results for input(s): LIPASE, AMYLASE in the last 168 hours.  Recent Labs Lab 12/31/15 1854  AMMONIA 9    CBC:  Recent Labs Lab 12/31/15 1634 01/01/16 0446 01/01/16 1740 01/02/16 0508 01/03/16 0437  WBC 10.8 7.0 6.4 4.9 5.4  HGB 8.0*  7.1* 7.8* 7.2* 8.7*  HCT 23.7* 21.2* 23.2* 21.3* 25.4*  MCV 85.9 86.0 86.1 85.6 85.9  PLT 221 162 177 148* 151    Cardiac Enzymes:  Recent Labs Lab 12/31/15 1634  TROPONINI 0.04*    BNP: Invalid input(s): POCBNP  CBG: No results for input(s): GLUCAP in the last 168 hours.  Microbiology: Results for orders placed or performed during the hospital encounter of 12/31/15  CULTURE, BLOOD (ROUTINE X 2) w Reflex to ID Panel     Status: None (Preliminary result)   Collection Time: 12/31/15 10:10 PM  Result Value Ref Range Status   Specimen Description BLOOD LEFT ANTECUBITAL  Final   Special Requests   Final    BOTTLES DRAWN AEROBIC AND ANAEROBIC 14CCAERO,15CCANA   Culture NO GROWTH 3 DAYS  Final   Report Status PENDING  Incomplete  CULTURE, BLOOD (ROUTINE X 2) w Reflex to ID Panel     Status: None (Preliminary result)   Collection Time: 12/31/15 10:10 PM  Result Value Ref Range Status   Specimen Description  BLOOD RIGHT HAND  Final   Special Requests BOTTLES DRAWN AEROBIC AND ANAEROBIC [place]  Final   Culture NO GROWTH 3 DAYS  Final   Report Status PENDING  Incomplete    Coagulation Studies:  Recent Labs  12/31/15 1634 01/01/16 0446 01/02/16 0508  LABPROT 22.9* 23.1* 24.2*  INR 1.99 2.01 2.13    Urinalysis: No results for input(s): COLORURINE, LABSPEC, PHURINE, GLUCOSEU, HGBUR, BILIRUBINUR, KETONESUR, PROTEINUR, UROBILINOGEN, NITRITE, LEUKOCYTESUR in the last 72 hours.  Invalid input(s): APPERANCEUR    Imaging: Mr Brain Wo Contrast  Result Date: 01/01/2016 CLINICAL DATA:  Possible seizure. Fall, hitting the back of the head. History of end-stage renal disease on dialysis. EXAM: MRI HEAD WITHOUT CONTRAST TECHNIQUE: Multiplanar, multiecho pulse sequences of the brain and surrounding structures were obtained without intravenous contrast. COMPARISON:  Head CTs 12/31/2015 and earlier FINDINGS: Brain: The study is mildly motion degraded. The mesial temporal lobe  structures are normal in appearance within this limitation. There is no evidence of acute infarct, intracranial hemorrhage, midline shift, or extra-axial fluid collection. The ventricles and sulci are normal in size for age. Periventricular and scattered subcortical white matter T2 hyperintensities are nonspecific but compatible with minimal chronic small vessel ischemic disease. Intermediate to low T1 and T2 signal mass along the undersurface of the posterior tentorium in/just right of midline measures approximately 1.7 x 1.0 x 1.5 cm, partially calcified on CT and at most minimally increased in size from a 10/04/2013 CT. There is no associated cerebellar edema. Vascular: Major intracranial vascular flow voids are preserved. Skull and upper cervical spine: Diffusely diminished bone marrow signal intensity which may relate to patient's chronic renal failure and anemia. Sinuses/Orbits: Minimal bilateral ethmoid and mild right sphenoid and right maxillary sinus mucosal thickening. Moderate bilateral mastoid effusions. Other: 1.2 cm subcutaneous nodule in the midline of the frontal scalp, benign in appearance and possibly a sebaceous cyst. IMPRESSION: 1. No acute intracranial abnormality or seizure focus identified. 2. 1.7 cm posterior fossa mass most consistent with meningioma. No cerebellar edema. 3. Minimal chronic small vessel ischemic disease. Electronically Signed   By: Logan Bores M.D.   On: 01/01/2016 12:09     Medications:    . aspirin EC  81 mg Oral Daily  . feeding supplement (ENSURE ENLIVE)  237 mL Oral TID BM  . heparin  5,000 Units Subcutaneous Q8H  . levETIRAcetam  500 mg Oral Daily  . losartan  100 mg Oral Once per day on Sun Tue Thu Sat  . metoprolol  50 mg Oral 2 times per day on Sun Tue Thu Sat  . [START ON 01/04/2016] pantoprazole (PROTONIX) IV  40 mg Intravenous Q24H  . sertraline  100 mg Oral Daily  . sodium chloride flush  3 mL Intravenous Q12H  . [START ON 01/05/2016] warfarin   5 mg Oral Once per day on Sun Fri Sat   And  . warfarin  7.5 mg Oral Once per day on Mon Tue Wed Thu  . Warfarin - Pharmacist Dosing Inpatient   Does not apply q1800   acetaminophen, ALPRAZolam, benzonatate, diphenhydrAMINE, levETIRAcetam, ondansetron (ZOFRAN) IV, senna-docusate, zolpidem  Assessment/ Plan:  [name] is a 60 y.o. 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   [facility]/MWF/[address] Right femoral permcath  1. ESRD on HD MWF: No acute indication for dialysis.  Next treatment for later today, orders prepared.   2. Hypertension: some elevations. With atrial fibrillation - metoprolol and losartan  3. Anemia of chronic kidney disease: hemoglobin 8.7 PRBC transfusion 12/26.  - Avoid Epogen given recent diagnosis of breast cancer and new onset seizure.  4. Secondary hyperparathyroidism. - currently not on binders.   5. Seizure: appreciate neurology input. Started on levetiracetam which is dialyzed so she will need a second dose after treatment.      LOS: [place], [place] 12/27/201710:32 AM

## 2016-01-05 LAB — CULTURE, BLOOD (ROUTINE X 2)
CULTURE: NO GROWTH
Culture: NO GROWTH

## 2016-01-05 LAB — TYPE AND SCREEN
BLOOD PRODUCT EXPIRATION DATE: 201801032359
Blood Product Expiration Date: 201801022359
Blood Product Expiration Date: 201801032359
ISSUE DATE / TIME: 201712251258
ISSUE DATE / TIME: 201712261204
UNIT TYPE AND RH: 5100
Unit Type and Rh: 5100
Unit Type and Rh: 5100

## 2016-01-05 NOTE — Discharge Summary (Signed)
[facility] [place] NAME: Teresa Cook    MR#:  [id number]  DATE OF BIRTH:  October 05, 1955  DATE OF ADMISSION:  12/31/2015   ADMITTING PHYSICIAN: [name], MD  DATE OF DISCHARGE: 01/03/2016  6:26 PM  PRIMARY CARE PHYSICIAN: Murlean Iba, MD   ADMISSION DIAGNOSIS:  Seizure ([place]) [R56.9] Seizures ([place]) [R56.9] DISCHARGE DIAGNOSIS:  Principal Problem:   Seizures ([place]) Active Problems:   Seizure ([place])   Severe anemia  SECONDARY DIAGNOSIS:   Past Medical History:  Diagnosis Date  . Anemia   . Anxiety   . Breast cancer ([place])   . CHF (congestive heart failure) ([place])   . Chronic kidney disease   . Depression   . Dialysis patient ([place])   . DVT (deep venous thrombosis) ([facility])    left leg  . DVT (deep venous thrombosis) ([place])   . Gout   . HTN (hypertension)   . Hypertension   . Parathyroid abnormality ([place])   . Parathyroid disease ([place])   . Psoriasis   . Renal insufficiency   . Sickle cell anemia ([facility])    traits   HOSPITAL COURSE:  60 y.o. female with a known history of ESRD, CHF, Depression, DVT, HTN, Admitted for seizures  * Seizures - continue Keppra twice a day. MRI on the brain Negative for any acute pathology. Neurology seen and agreed to continue Keppra  * Anemia of chronic kidney disease - s/p 2 PRBC transfusion and stable hemodynamics since then  * Hypokalemia - Replete and resolved  * End-stage renal disease on hemodialysis Received HD while inpt per nephro  * DVT On Coumadin - INR 2.01  * Hypertension - continueMetoprolol, Losartan.  * Depression Zoloft  DISCHARGE CONDITIONS:  STABLE CONSULTS OBTAINED:  Treatment Team:  Anthonette Legato, MD Leotis Pain, MD Jonathon Bellows, MD DRUG ALLERGIES:   Allergies  Allergen Reactions  . Adhesive [Tape] Itching    Silk tape is ok to use.   DISCHARGE MEDICATIONS:   Allergies as of 01/03/2016      Reactions   Adhesive  [tape] Itching   Silk tape is ok to use.      Medication List    TAKE these medications   acetaminophen 325 MG tablet Commonly known as:  TYLENOL Take 2 tablets (650 mg total) by mouth every 6 (six) hours as needed for mild pain (or Fever >/= 101).   ALPRAZolam 0.5 MG tablet Commonly known as:  XANAX Take 1 tablet (0.5 mg total) by mouth 2 (two) times daily as needed for anxiety.   benzonatate 200 MG capsule Commonly known as:  TESSALON Take 200 mg by mouth 3 (three) times daily as needed for cough.   diphenhydrAMINE 25 mg capsule Commonly known as:  BENADRYL Take 1 capsule (25 mg total) by mouth every 8 (eight) hours as needed for itching.   feeding supplement (ENSURE ENLIVE) Liqd Take 237 mLs by mouth 3 (three) times daily between meals.   feeding supplement (NEPRO CARB STEADY) Liqd Take 237 mLs by mouth 2 (two) times daily between meals.   levETIRAcetam 500 MG tablet Commonly known as:  KEPPRA Take 1 tablet (500 mg total) by mouth 2 (two) times daily.   losartan 50 MG tablet Commonly known as:  COZAAR Take 100 mg by mouth daily. Takes on non-dialysis days=Tuesday, Thursday, Saturday and Sunday.   metoprolol 100 MG tablet Commonly known as:  LOPRESSOR Take 0.5 tablets (50 mg total) by mouth 2 (two) times  daily. Takes on non-dialysis days=Tuesday, Thursday, Saturday and Sunday.   senna-docusate 8.6-50 MG tablet Commonly known as:  Senokot-S Take 1 tablet by mouth at bedtime as needed for mild constipation.   sertraline 100 MG tablet Commonly known as:  ZOLOFT Take 100 mg by mouth daily.   traMADol 50 MG tablet Commonly known as:  ULTRAM Take 1 tablet (50 mg total) by mouth every 6 (six) hours as needed for moderate pain.   warfarin 5 MG tablet Commonly known as:  COUMADIN Take 1 tablet Friday, Saturday and Sundays. Take 1 1/2 tablets the other days of the week   zolpidem 10 MG tablet Commonly known as:  AMBIEN Take 10 mg by mouth at bedtime as needed for  sleep.        DISCHARGE INSTRUCTIONS:   DIET:  Renal diet DISCHARGE CONDITION:  Good ACTIVITY:  Activity as tolerated OXYGEN:  Home Oxygen: No.  Oxygen Delivery: room air DISCHARGE LOCATION:  home   If you experience worsening of your admission symptoms, develop shortness of breath, life threatening emergency, suicidal or homicidal thoughts you must seek medical attention immediately by calling 911 or calling your MD immediately  if symptoms less severe.  You Must read complete instructions/literature along with all the possible adverse reactions/side effects for all the Medicines you take and that have been prescribed to you. Take any new Medicines after you have completely understood and accpet all the possible adverse reactions/side effects.   Please note  You were cared for by a hospitalist during your hospital stay. If you have any questions about your discharge medications or the care you received while you were in the hospital after you are discharged, you can call the unit and asked to speak with the hospitalist on call if the hospitalist that took care of you is not available. Once you are discharged, your primary care physician will handle any further medical issues. Please note that NO REFILLS for any discharge medications will be authorized once you are discharged, as it is imperative that you return to your primary care physician (or establish a relationship with a primary care physician if you do not have one) for your aftercare needs so that they can reassess your need for medications and monitor your lab values.    On the day of Discharge:  VITAL SIGNS:  Blood pressure (!) 148/81, pulse 83, temperature 98.8 F (37.1 C), temperature source Oral, resp. rate (!) 22, height 5\' 6"  (1.676 m), weight 61.6 kg (135 lb 12.9 oz), SpO2 100 %. PHYSICAL EXAMINATION:  GENERAL:  60 y.o.-year-old patient lying in the bed with no acute distress.  EYES: Pupils equal, round, reactive  to light and accommodation. No scleral icterus. Extraocular muscles intact.  HEENT: Head atraumatic, normocephalic. Oropharynx and nasopharynx clear.  NECK:  Supple, no jugular venous distention. No thyroid enlargement, no tenderness.  LUNGS: Normal breath sounds bilaterally, no wheezing, rales,rhonchi or crepitation. No use of accessory muscles of respiration.  CARDIOVASCULAR: S1, S2 normal. No murmurs, rubs, or gallops.  ABDOMEN: Soft, non-tender, non-distended. Bowel sounds present. No organomegaly or mass.  EXTREMITIES: No pedal edema, cyanosis, or clubbing.  NEUROLOGIC: Cranial nerves II through XII are intact. Muscle strength 5/5 in all extremities. Sensation intact. Gait not checked.  PSYCHIATRIC: The patient is alert and oriented x 3.  SKIN: No obvious rash, lesion, or ulcer.  DATA REVIEW:   CBC  Recent Labs Lab 01/03/16 1310  WBC 5.5  HGB 9.3*  HCT 27.6*  PLT 174  Chemistries   Recent Labs Lab 12/31/15 1634 01/01/16 0446  01/03/16 1310  NA 136 137  < > 134*  K 3.5 3.4*  < > 5.9*  CL 100* 103  < > 102  CO2 24 26  < > 23  GLUCOSE 103* 88  < > 93  BUN 9 13  < > 41*  CREATININE 2.62* 4.00*  < > 8.48*  CALCIUM 9.5 8.9  < > 8.9  MG 1.8 1.9  --   --   AST 29  --   --   --   ALT 15  --   --   --   ALKPHOS 189*  --   --   --   BILITOT 0.6  --   --   --   < > = values in this interval not displayed.   Follow-up Information    Schedule an appointment as soon as possible for a visit with Melrose Nakayama Liliane Shi, MD.   Specialty:  Neurology Contact information: [facility] [place] [address] [phone]        Murlean Iba, MD. Schedule an appointment as soon as possible for a visit in 2 week(s).   Specialty:  Internal Medicine Contact information: [place] [address] [phone]            Management plans discussed with the patient, family and they are in  agreement.  CODE STATUS: FULL CODE  TOTAL TIME TAKING CARE OF THIS PATIENT: 45 minutes.    Max Sane M.D on 01/05/2016 at 9:46 PM  Between 7am to 6pm - Pager - [phone]  After 6pm go to [email or web address] - Proofreader  [facility]  Office  [phone]  CC: Primary care physician; Murlean Iba, MD   Note: This dictation was prepared with Dragon dictation along with smaller phrase technology. Any transcriptional errors that result from this process are unintentional.

## 2016-01-08 DIAGNOSIS — C50919 Malignant neoplasm of unspecified site of unspecified female breast: Secondary | ICD-10-CM

## 2016-01-08 HISTORY — DX: Malignant neoplasm of unspecified site of unspecified female breast: C50.919

## 2016-01-10 ENCOUNTER — Telehealth: Payer: Self-pay

## 2016-01-10 NOTE — Telephone Encounter (Signed)
Spoke with Judeen Hammans from Anesthesia at this time whom reviewed chart for patient duet to recent admissions. She is requesting Cardiac and Pulmonary clearance prior to proceeding with patient's surgery on 01/23/16.  Call made to patient to find out if she has a cardiologist or pulmonologist or has a preference. No answer on either number. Left voicemail on one number, will try back later if I do not hear back from patient.

## 2016-01-11 NOTE — Telephone Encounter (Signed)
Spoke with patient at this time. She has no preference or current cardiologist and pulmonologist.  Call made to [facility]. Her appointments are as follows: Pulmonology- Dr. Mortimer Fries - 01/17/16 at 68 Cardiology- Dr. Yvone Neu- 01/18/16 at 1100  Patient has been made aware of all appointment information above.   Clearance forms have been faxed to both offices with positive confirmation at this time. Will await clearance.

## 2016-01-15 ENCOUNTER — Telehealth: Payer: Self-pay

## 2016-01-15 ENCOUNTER — Emergency Department: Payer: Medicare Other

## 2016-01-15 ENCOUNTER — Emergency Department
Admission: EM | Admit: 2016-01-15 | Discharge: 2016-01-16 | Disposition: A | Discharge status: Home / Self Care | Payer: Medicare Other | Attending: Emergency Medicine | Admitting: Emergency Medicine

## 2016-01-15 ENCOUNTER — Ambulatory Visit: Payer: Medicare Other | Admitting: General Surgery

## 2016-01-15 ENCOUNTER — Encounter: Payer: Self-pay | Admitting: Emergency Medicine

## 2016-01-15 DIAGNOSIS — N63 Unspecified lump in unspecified breast: Secondary | ICD-10-CM | POA: Insufficient documentation

## 2016-01-15 DIAGNOSIS — Z79899 Other long term (current) drug therapy: Secondary | ICD-10-CM | POA: Insufficient documentation

## 2016-01-15 DIAGNOSIS — Z853 Personal history of malignant neoplasm of breast: Secondary | ICD-10-CM | POA: Insufficient documentation

## 2016-01-15 DIAGNOSIS — N6310 Unspecified lump in the right breast, unspecified quadrant: Secondary | ICD-10-CM

## 2016-01-15 DIAGNOSIS — I150 Renovascular hypertension: Secondary | ICD-10-CM

## 2016-01-15 DIAGNOSIS — I509 Heart failure, unspecified: Secondary | ICD-10-CM | POA: Diagnosis not present

## 2016-01-15 DIAGNOSIS — E041 Nontoxic single thyroid nodule: Secondary | ICD-10-CM | POA: Diagnosis not present

## 2016-01-15 DIAGNOSIS — R197 Diarrhea, unspecified: Secondary | ICD-10-CM | POA: Diagnosis not present

## 2016-01-15 DIAGNOSIS — N183 Chronic kidney disease, stage 3 (moderate): Secondary | ICD-10-CM | POA: Diagnosis not present

## 2016-01-15 DIAGNOSIS — I130 Hypertensive heart and chronic kidney disease with heart failure and stage 1 through stage 4 chronic kidney disease, or unspecified chronic kidney disease: Secondary | ICD-10-CM | POA: Diagnosis not present

## 2016-01-15 DIAGNOSIS — R0602 Shortness of breath: Secondary | ICD-10-CM

## 2016-01-15 LAB — PROTIME-INR
INR: 2.64
PROTHROMBIN TIME: 28.7 s — AB (ref 11.4–15.2)

## 2016-01-15 LAB — HEPATIC FUNCTION PANEL
ALBUMIN: 3.2 g/dL — AB (ref 3.5–5.0)
ALT: 23 U/L (ref 14–54)
AST: 25 U/L (ref 15–41)
Alkaline Phosphatase: 225 U/L — ABNORMAL HIGH (ref 38–126)
Bilirubin, Direct: <0.1 mg/dL — ABNORMAL LOW (ref 0.1–0.5)
TOTAL PROTEIN: 6.7 g/dL (ref 6.5–8.1)
Total Bilirubin: 0.5 mg/dL (ref 0.3–1.2)

## 2016-01-15 LAB — BASIC METABOLIC PANEL
Anion gap: 8 (ref 5–15)
BUN: 14 mg/dL (ref 6–20)
CHLORIDE: 109 mmol/L (ref 101–111)
CO2: 24 mmol/L (ref 22–32)
CREATININE: 3.84 mg/dL — AB (ref 0.44–1.00)
Calcium: 9.8 mg/dL (ref 8.9–10.3)
GFR calc Af Amer: 14 mL/min — ABNORMAL LOW (ref >60)
GFR calc non Af Amer: 12 mL/min — ABNORMAL LOW (ref >60)
Glucose, Bld: 110 mg/dL — ABNORMAL HIGH (ref 65–99)
Potassium: 3.5 mmol/L (ref 3.5–5.1)
SODIUM: 141 mmol/L (ref 135–145)

## 2016-01-15 LAB — CBC
HCT: 27.7 % — ABNORMAL LOW (ref 35.0–47.0)
Hemoglobin: 9.3 g/dL — ABNORMAL LOW (ref 12.0–16.0)
MCH: 28.4 pg (ref 26.0–34.0)
MCHC: 33.5 g/dL (ref 32.0–36.0)
MCV: 84.8 fL (ref 80.0–100.0)
PLATELETS: 174 10*3/uL (ref 150–440)
RBC: 3.27 MIL/uL — ABNORMAL LOW (ref 3.80–5.20)
RDW: 17 % — AB (ref 11.5–14.5)
WBC: 4.5 10*3/uL (ref 3.6–11.0)

## 2016-01-15 LAB — TROPONIN I: Troponin I: 0.03 ng/mL (ref <0.03)

## 2016-01-15 LAB — BRAIN NATRIURETIC PEPTIDE: B Natriuretic Peptide: 281 pg/mL — ABNORMAL HIGH (ref 0.0–100.0)

## 2016-01-15 LAB — LIPASE, BLOOD: Lipase: 29 U/L (ref 11–51)

## 2016-01-15 LAB — RAPID INFLUENZA A&B ANTIGENS (ARMC ONLY): INFLUENZA A (ARMC): NEGATIVE

## 2016-01-15 LAB — RAPID INFLUENZA A&B ANTIGENS: Influenza B (ARMC): NEGATIVE

## 2016-01-15 MED ORDER — ALBUTEROL SULFATE (2.5 MG/3ML) 0.083% IN NEBU: 5.0000 mg | INHALATION_SOLUTION | Freq: Once | RESPIRATORY_TRACT | Status: DC

## 2016-01-15 MED ORDER — METOPROLOL TARTRATE 25 MG PO TABS
25.0000 mg | ORAL_TABLET | Freq: Once | ORAL | Status: AC
  Administered 2016-01-15: 25 mg via ORAL
  Filled 2016-01-15: qty 1

## 2016-01-15 MED ORDER — IOPAMIDOL (ISOVUE-370) INJECTION 76%
75.0000 mL | Freq: Once | INTRAVENOUS | Status: AC | PRN
  Administered 2016-01-15: 75 mL via INTRAVENOUS

## 2016-01-15 NOTE — ED Triage Notes (Signed)
Pt to ED via EMS from home with c/o diarrhea since yesterday. Pt was dialyzed today and started having weakness and SOB after she states. Pt A&Ox4. Per EMS 153/100, HR 100, 98% on RA.

## 2016-01-15 NOTE — Discharge Instructions (Addendum)
Please take a BRAT diet as described in the paperwork for your diarrhea. Please make an appointment with your primary care doctor for your symptoms.  Your CT scan did not show any evidence of blood clot in your lungs. However, there is a mass in your right breast which needs to be evaluated, as well as a mass in your thyroid that will need further imaging. Please discuss these with your primary care physician to have the appropriate follow-up.  Return to the emergency department if you develop severe pain, lightheadedness or fainting, fever, chest pain or palpitations, shortness of breath, or any other symptoms concerning to you.

## 2016-01-15 NOTE — ED Provider Notes (Signed)
[facility] Emergency Department Provider Note  ____________________________________________  Time seen: Approximately 9:28 PM  I have reviewed the triage vital signs and the nursing notes.   HISTORY  Chief Complaint Diarrhea and Shortness of Breath    HPI Teresa Cook is a 61 y.o. female with a history of ESRD on HD, CHF, DVT, breast cancer presenting withdiarrhea, shortness of breath, cough. The patient reports that since yesterday she has had 3 episodes of loose stool without blood. Today, after dialysis, she developed exertional shortness of breath which was worse with lying down, and generalized weakness. She has also had a cough that is intermittently productive of green sputum. She is not had any nausea or vomiting, abdominal pain, chest pain or discomfort or pressure. She is not noticed any calf tenderness or lower extremity swelling. She thought she had had a fever, but the highest temperature she has obtained was 99.4.   Past Medical History:  Diagnosis Date  . Anemia   . Anxiety   . Breast cancer ([place])   . CHF (congestive heart failure) ([place])   . Chronic kidney disease   . Depression   . Dialysis patient ([place])   . DVT (deep venous thrombosis) (HCC)    left leg  . DVT (deep venous thrombosis) ([place])   . Gout   . HTN (hypertension)   . Hypertension   . Parathyroid abnormality ([place])   . Parathyroid disease ([place])   . Psoriasis   . Renal insufficiency   . Sickle cell anemia (HCC)    traits    Patient Active Problem List   Diagnosis Date Noted  . Severe anemia 01/01/2016  . Seizure ([place]) 12/31/2015  . Healthcare-associated pneumonia   . Bloodstream infection due to port-a-cath, initial encounter   . Sepsis ([place]) 12/15/2015  . Chest pain 08/31/2015  . SIRS (systemic inflammatory response syndrome) ([place]) 08/27/2015  . Malignant neoplasm of upper-inner quadrant of left breast in female, estrogen receptor positive ([place]) 08/05/2015  .  Pulmonary edema 07/09/2015  . Dizziness 03/03/2015  . Seizures ([place]) 03/03/2015  . Spells 11/11/2014  . Acute respiratory failure with hypoxia ([place]) 09/19/2014  . Left ventricular dysfunction 09/06/2014  . Acute pulmonary edema ([place]) 08/30/2014  . Respiratory failure ([place]) 08/29/2014  . Respiratory difficulty 08/29/2014  . Chronic systolic heart failure ([place]) 05/16/2014  . Hypertension 05/16/2014  . Renal failure 05/16/2014  . Anxiety 05/16/2014  . Sickle cell trait ([place]) 03/03/2014  . ESRD on dialysis ([place]) 11/02/2013  . Dysgeusia 01/27/2013  . Weight loss 01/27/2013  . Resting tremor 08/13/2012  . Depression 04/30/2012  . 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 Right leg DVT ([place]) 04/30/2012    Past Surgical History:  Procedure Laterality Date  . ABDOMINAL HYSTERECTOMY    . APPENDECTOMY    . BREAST BIOPSY Left 10/28/2013   benign  . BREAST EXCISIONAL BIOPSY Left 2002   benign  . INSERTION OF DIALYSIS CATHETER  2014  . PARTIAL HYSTERECTOMY    . PERIPHERAL VASCULAR CATHETERIZATION N/A 12/25/2015   Procedure: Dialysis/Perma Catheter Insertion;  Surgeon: Algernon Huxley, MD;  Location: [facility];  Service: Cardiovascular;  Laterality: N/A;  . PORT-A-CATH REMOVAL N/A 12/20/2015   Procedure: REMOVAL PORT-A-CATH;  Surgeon: Hubbard Robinson, MD;  Location: [facility];  Service: General;  Laterality: N/A;  left    . PORTACATH PLACEMENT Left 08/21/2015   Procedure: INSERTION PORT-A-CATH;  Surgeon: Hubbard Robinson, MD;  Location: [facility];  Service: General;  Laterality: Left;    Current Outpatient Rx  . Order #: [id number] Class: OTC  . Order #: [id number] Class: Print  . Order #: [id number] Class: Historical Med  . Order #: [id number] Class: Historical Med  . Order #: [id number] Class: OTC  . Order #: [id number] Class: Normal  . Order #: [id number] Class: Historical Med  . Order #: [id number] Class: Print  . Order #: [id number] Class: Historical Med  . Order #:  [id number] Class: Print  . Order #: [id number] Class: Print  . Order #: [id number] Class: Historical Med  . Order #: [id number] Class: OTC  . Order #: [id number] Class: Historical Med  . Order #: [id number] Class: Print  . Order #: [id number] Class: Historical Med  . Order #: [id number] Class: Historical Med    Allergies Adhesive [tape]  Family History  Problem Relation Age of Onset  . Stroke Mother   . CVA Mother   . Hypertension Mother   . Hypertension Father   . Hypertension Sister   . Diabetes Sister   . Cancer Sister 67    Breast  . Stroke Brother   . Hypertension Brother   . Breast cancer Maternal Aunt 70    Social History Social History  Substance Use Topics  . Smoking status: Never Smoker  . Smokeless tobacco: Never Used  . Alcohol use No    Review of Systems Constitutional: No fever/chills.No lightheadedness or syncope. Eyes: No visual changes. No eye discharge. ENT: No sore throat. No congestion or rhinorrhea. Cardiovascular: Denies chest pain. Denies palpitations. Respiratory: Positive exertional and positional shortness of breath.  Positive cough. Gastrointestinal: No abdominal pain.  No nausea, no vomiting.  Positive diarrhea.  No constipation. Genitourinary: Only has minimal urination. Musculoskeletal: Negative for back pain. Skin: Negative for rash. Neurological: Negative for headaches. No focal numbness, tingling or weakness.   10-point ROS otherwise negative.  ____________________________________________   PHYSICAL EXAM:  VITAL SIGNS: ED Triage Vitals  Enc Vitals Group     BP 01/15/16 2100 (!) 170/115     Pulse Rate 01/15/16 2100 91     Resp 01/15/16 2100 16     Temp 01/15/16 2100 98.7 F (37.1 C)     Temp Source 01/15/16 2100 Oral     SpO2 01/15/16 2100 97 %     Weight --      Height --      Head Circumference --      Peak Flow --      Pain Score 01/15/16 2102 0     Pain Loc --      Pain Edu? --      Excl. in [place]? --     Constitutional:  Alert and oriented. Medically ill appearing but nontoxic. Answers questions appropriately. Eyes: Conjunctivae are normal.  EOMI. No scleral icterus. No eye discharge. Head: Atraumatic. Nose: No congestion/rhinnorhea. Mouth/Throat: Mucous membranes are moist.  Neck: No stridor.  Supple.  No JVD. No meningismus. Cardiovascular: Normal rate, regular rhythm. No murmurs, rubs or gallops. Hypertensive to 181/117. Respiratory: Normal respiratory effort.  No accessory muscle use or retractions. Lungs CTAB.  No wheezes, rales or ronchi. Gastrointestinal: Soft, nontender and nondistended.  No guarding or rebound.  No peritoneal signs. Musculoskeletal: No LE edema. No ttp in the calves or palpable cords.  Negative Homan's sign. Patient has a PermCath in the right inguinal crease that is clean dry and intact without any tenderness to palpation. Neurologic:  A&Ox3.  Speech is clear.  Face and smile are symmetric.  EOMI.  Moves all extremities well. Skin:  Skin is warm, dry and  intact. No rash noted. Psychiatric: Mood and affect are normal. Speech and behavior are normal.  Normal judgement.  ____________________________________________   LABS (all labs ordered are listed, but only abnormal results are displayed)  Labs Reviewed  BASIC METABOLIC PANEL - Abnormal; Notable for the following:       Result Value   Glucose, Bld 110 (*)    Creatinine, Ser 3.84 (*)    GFR calc non Af Amer 12 (*)    GFR calc Af Amer 14 (*)    All other components within normal limits  CBC - Abnormal; Notable for the following:    RBC 3.27 (*)    Hemoglobin 9.3 (*)    HCT 27.7 (*)    RDW 17.0 (*)    All other components within normal limits  TROPONIN I - Abnormal; Notable for the following:    Troponin I 0.03 (*)    All other components within normal limits  HEPATIC FUNCTION PANEL - Abnormal; Notable for the following:    Albumin 3.2 (*)    Alkaline Phosphatase 225 (*)    Bilirubin, Direct <0.1 (*)    All other  components within normal limits  BRAIN NATRIURETIC PEPTIDE - Abnormal; Notable for the following:    B Natriuretic Peptide 281.0 (*)    All other components within normal limits  PROTIME-INR - Abnormal; Notable for the following:    Prothrombin Time 28.7 (*)    All other components within normal limits  RAPID INFLUENZA A&B ANTIGENS ([facility] ONLY)  C DIFFICILE QUICK SCREEN W PCR REFLEX  GASTROINTESTINAL PANEL BY PCR, STOOL (REPLACES STOOL CULTURE)  LIPASE, BLOOD   ____________________________________________  EKG  ED ECG REPORT I, Eula Listen, the attending physician, personally viewed and interpreted this ECG.   Date: 01/15/2016  EKG Time: 2114  Rate: 82  Rhythm: normal sinus rhythm  Axis: normal  Intervals:none  ST&T Change: No STEMI; + LVH  ____________________________________________  RADIOLOGY  Dg Chest 2 View  Result Date: 01/15/2016 CLINICAL DATA:  Weakness and shortness of breath after dialysis EXAM: CHEST  2 VIEW COMPARISON:  12/24/2015 FINDINGS: Lungs are hyperinflated. Ascending catheter tip overlies the low right atrium. There is no acute consolidation or effusion. Stable cardiomediastinal silhouette with atherosclerosis. No pneumothorax. Stable kyphosis. IMPRESSION: 1. No acute infiltrate or edema 2. Atherosclerosis of the aorta Electronically Signed   By: Donavan Foil M.D.   On: 01/15/2016 21:39   Ct Angio Chest Pe W And/or Wo Contrast  Result Date: 01/15/2016 CLINICAL DATA:  Diarrhea since yesterday. Dialysis today. Weakness and shortness of breath. EXAM: CT ANGIOGRAPHY CHEST WITH CONTRAST TECHNIQUE: Multidetector CT imaging of the chest was performed using the standard protocol during bolus administration of intravenous contrast. Multiplanar CT image reconstructions and MIPs were obtained to evaluate the vascular anatomy. CONTRAST:  75 mL Isovue 370 COMPARISON:  08/31/2015 FINDINGS: Cardiovascular: Satisfactory opacification of the pulmonary arteries to the  segmental level. No evidence of pulmonary embolism. Mild cardiac enlargement. No pericardial effusion. Ascending thoracic aorta measuring 4.5 cm maximal AP diameter. No change since prior study. Scattered aortic calcification. Calcification of the coronary arteries. Mediastinum/Nodes: No enlarged mediastinal, hilar, or axillary lymph nodes. Asymmetric nodular enlargement of the right thyroid gland with focal hypoenhancing nodule measuring 2.5 cm diameter. Trachea and esophagus demonstrate no significant findings. Lungs/Pleura: Mild dependent atelectasis in the lung bases. No focal consolidation. Airways are patent. No pleural effusions. No pneumothorax. Upper Abdomen: Nonobstructing stone in the right kidney. Central venous catheter demonstrated at the inferior  vena caval atrial junction. Musculoskeletal: Circumscribed soft tissue lesion demonstrated in the right breast. This was not definitely present previously and could represent developing abscess, hematoma, or mass lesion. Elective mammographic correlation is recommended. Nodular soft tissue structure in the subcutaneous fat inferior to the right supraclavicular region measuring 12 mm diameter. On the previous study, there was a central venous catheter inserted a dislocation this likely represents postprocedural scarring or fluid. Review of the MIP images confirms the above findings. IMPRESSION: 1. No evidence of significant pulmonary embolus. 2. 4.5 cm diameter ascending thoracic aortic aneurysm. No change since previous study. Ascending thoracic aortic aneurysm. Recommend semi-annual imaging followup by CTA or MRA and referral to cardiothoracic surgery if not already obtained. This recommendation follows 2010 ACCF/AHA/AATS/ACR/ASA/SCA/SCAI/SIR/STS/SVM Guidelines for the Diagnosis and Management of Patients With Thoracic Aortic Disease. Circulation. 2010; 121: e266-e369 3. 2.5 cm diameter right thyroid nodule. Ultrasound recommended for further evaluation. 4.  Indeterminate 3.5 cm diameter right breast lesion. Mammographic follow-up recommended. 5. No evidence of active pulmonary disease. Electronically Signed   By: Lucienne Capers M.D.   On: 01/15/2016 22:50    ____________________________________________   PROCEDURES  Procedure(s) performed: None  Procedures  Critical Care performed: No ____________________________________________   INITIAL IMPRESSION / ASSESSMENT AND PLAN / ED COURSE  Pertinent labs & imaging results that were available during my care of the patient were reviewed by me and considered in my medical decision making (see chart for details).  61 y.o. female with a history of ESRD on HD presenting with 3 episodes of diarrhea in the last 24 hours, exertional and positional shortness of breath, and cough. Overall, the patient is chronically ill appearing but nontoxic. Her vital signs show hypertension but no fever, tachycardia or hypoxia. Her abdomen is soft, there is no evidence for acute infection or surgical illness.  It is possible that the patient has CHF with orthopnea, so a get a BNP and chest x-ray to evaluate for pulmonary edema. She does have a history of DVT, has a history of cancer, and is not very mobile so PE is also on the differential. We'll get a CT entered him to evaluate for this. Also treat the patient's blood pressure with oral metoprolol at half her home dose. Plan reevaluation for final disposition.  ----------------------------------------- 11:03 PM on 01/15/2016 -----------------------------------------  The patient's workup in the emergency department is reassuring. Her vital signs have remained stable with the exception of her hypertension which is from her renal vascular disease. She does not have an elevation of her white blood cell count, and her flu testing is negative. Her troponin is 0.03, which is baseline for her. Her BNP is not elevated compared to her baseline and she has no acute findings on  her chest x-ray. Her CT angiogram does not show PE. She does have a right breast mass in thyroid nodule, which she will follow-up with her primary care physician. The patient is stable for discharge at this time. ____________________________________________  FINAL CLINICAL IMPRESSION(S) / ED DIAGNOSES  Final diagnoses:  Diarrhea, unspecified type  Shortness of breath  Renovascular hypertension  Breast mass, right  Thyroid nodule    Clinical Course       NEW MEDICATIONS STARTED DURING THIS VISIT:  New Prescriptions   No medications on file      Eula Listen, MD 01/15/16 2304

## 2016-01-15 NOTE — Telephone Encounter (Signed)
Teresa Cook received a call from [employer] and she is returning her call. Please call patient and advice.

## 2016-01-16 ENCOUNTER — Telehealth: Payer: Self-pay | Admitting: Surgery

## 2016-01-16 ENCOUNTER — Other Ambulatory Visit: Payer: Self-pay

## 2016-01-16 ENCOUNTER — Encounter
Admission: RE | Admit: 2016-01-16 | Discharge: 2016-01-16 | Disposition: A | Discharge status: Home / Self Care | Payer: Medicare Other | Source: Ambulatory Visit | Attending: Surgery | Admitting: Surgery

## 2016-01-16 DIAGNOSIS — D170 Benign lipomatous neoplasm of skin and subcutaneous tissue of head, face and neck: Secondary | ICD-10-CM

## 2016-01-16 DIAGNOSIS — Z01818 Encounter for other preprocedural examination: Secondary | ICD-10-CM

## 2016-01-16 DIAGNOSIS — Z170 Estrogen receptor positive status [ER+]: Secondary | ICD-10-CM

## 2016-01-16 DIAGNOSIS — N6091 Unspecified benign mammary dysplasia of right breast: Secondary | ICD-10-CM

## 2016-01-16 DIAGNOSIS — C50912 Malignant neoplasm of unspecified site of left female breast: Secondary | ICD-10-CM

## 2016-01-16 HISTORY — DX: Headache, unspecified: R51.9

## 2016-01-16 HISTORY — DX: Sickle-cell trait: D57.3

## 2016-01-16 HISTORY — DX: Pneumonia, unspecified organism: J18.9

## 2016-01-16 HISTORY — DX: Headache: R51

## 2016-01-16 NOTE — Patient Instructions (Signed)
  Your procedure is scheduled on: Tuesday Jan. 16 , 2018. Report to Same Day Surgery. To find out your arrival time please call [phone] between 1PM - 3PM on Monday Jan. 15, 2018.  Remember: Instructions that are not followed completely may result in serious medical risk, up to and including death, or upon the discretion of your surgeon and anesthesiologist your surgery may need to be rescheduled.    _x___ 1. Do not eat food or drink liquids after midnight. No gum chewing or hard candies.     ____ 2. No Alcohol for 24 hours before or after surgery.   ____ 3. Bring all medications with you on the day of surgery if instructed.    __x__ 4. Notify your doctor if there is any change in your medical condition     (cold, fever, infections).    _____ 5. No smoking 24 hours prior to surgery.     Do not wear jewelry, make-up, hairpins, clips or nail polish.  Do not wear lotions, powders, or perfumes.   Do not shave 48 hours prior to surgery. Men may shave face and neck.  Do not bring valuables to the hospital.    [facility] is not responsible for any belongings or valuables.               Contacts, dentures or bridgework may not be worn into surgery.  Leave your suitcase in the car. After surgery it may be brought to your room.  For patients admitted to the hospital, discharge time is determined by your treatment team.   Patients discharged the day of surgery will not be allowed to drive home.    Please read over the following fact sheets that you were given:   [facility] Preparing for Surgery  ____ Take these medicines the morning of surgery with A SIP OF WATER:    1. losartan (COZAAR)  2. metoprolol (LOPRESSOR)  3. pantoprazole (PROTONIX)  4. levETIRAcetam (KEPPRA)    ____ Fleet Enema (as directed)   __x__ Use SAGE wipes as directed on instruction sheet  ____ Use inhalers on the day of surgery and bring to hospital day of surgery  ____ Stop metformin 2 days prior to  surgery    ____ Take 1/2 of usual insulin dose the night before surgery and none on the morning of surgery.   ____ Stop Coumadin on   __x__ Stop Anti-inflammatories such as Advil, Aleve, Ibuprofen, Motrin, Naproxen, Naprosyn, Goodies powders or aspirin products. OK to take Tylenol.   ____ Stop supplements until after surgery.    ____ Bring C-Pap to the hospital.

## 2016-01-16 NOTE — Pre-Procedure Instructions (Signed)
Spoke with Museum/gallery conservator at Dr. Geoffry Paradise office, since pt just had labs drawn yesterday, no labs need to be drawn today.

## 2016-01-16 NOTE — Telephone Encounter (Signed)
Pt advised of pre op date/time and sx date. Sx: 01/23/16 with Dr Azalee Course with Dr Genevive Bi assisting--Bilateral mastectomy with Sentnel Node injection and lipoma removal from forehead.  Pre op: 01/16/16 @ 1:30pm-office.   Patient made aware to call [phone], between 1-3:00pm the day before surgery, to find out what time to arrive.     Patient is aware of appointments for pulmonary and cardiac clearance.   Dr Mortimer Fries 01/17/16 @ 4:00pm Dr Yvone Neu 01/18/16 @ 11:00am.

## 2016-01-16 NOTE — Pre-Procedure Instructions (Signed)
ECG 12 Lead7/11/2015 [facility] Component Name Value Ref Range  EKG Ventricular Rate 73 BPM   EKG Atrial Rate 73 BPM   EKG P-R Interval 158 ms  EKG QRS Duration 92 ms  EKG Q-T Interval 422 ms  EKG QTC Calculation 464 ms  EKG Calculated P Axis 70 degrees   EKG Calculated R Axis 11 degrees   EKG Calculated T Axis 117 degrees   Result Narrative  NORMAL SINUS RHYTHM MODERATE VOLTAGE CRITERIA FOR LVH, MAY BE NORMAL VARIANT NONSPECIFIC T WAVE ABNORMALITY ABNORMAL ECG WHEN COMPARED WITH ECG OF 04-Jul-2015 12:47, NO SIGNIFICANT CHANGE WAS FOUND Confirmed by [place], CODY ([id number]) on 07/24/2015 9:46:58 AM

## 2016-01-17 ENCOUNTER — Inpatient Hospital Stay: Admission: RE | Admit: 2016-01-17 | Payer: Medicare Other | Source: Ambulatory Visit

## 2016-01-17 ENCOUNTER — Ambulatory Visit: Payer: Medicare Other | Admitting: Cardiology

## 2016-01-17 ENCOUNTER — Ambulatory Visit (INDEPENDENT_AMBULATORY_CARE_PROVIDER_SITE_OTHER): Payer: Medicare Other | Admitting: Internal Medicine

## 2016-01-17 ENCOUNTER — Encounter: Payer: Self-pay | Admitting: Internal Medicine

## 2016-01-17 VITALS — BP 140/88 | HR 104 | Wt 129.0 lb

## 2016-01-17 DIAGNOSIS — Z01811 Encounter for preprocedural respiratory examination: Secondary | ICD-10-CM | POA: Diagnosis not present

## 2016-01-17 NOTE — Progress Notes (Signed)
[facility] Consultation      Date: 01/17/2016,   MRN# [id number] Teresa Cook 07-Nov-1955 Code Status:  Code Status History    Date Active Date Inactive Code Status Order ID Comments User Context   12/31/2015  9:39 PM 01/03/2016  9:31 PM Full Code [id number]  [name], MD Inpatient   12/15/2015 10:40 PM 12/28/2015  6:52 PM Full Code [id number]  Harvie Bridge, DO Inpatient   08/31/2015  4:26 AM 09/02/2015  2:35 PM Full Code [id number]  Harrie Foreman, MD Inpatient   08/27/2015  4:06 AM 08/29/2015  3:57 PM Full Code [id number]  Saundra Shelling, MD ED   07/09/2015  2:44 PM 07/12/2015  7:55 PM Full Code [id number]  Laverle Hobby, MD ED   09/19/2014 11:47 AM 09/20/2014  6:25 PM Full Code [id number]  Dustin Flock, MD ED   08/29/2014  2:28 PM 08/30/2014  5:43 PM Full Code [id number]  Fritzi Mandes, MD Inpatient     Hosp day:@LENGTHOFSTAYDAYS @ Referring MD: @ATDPROV @     PCP:      AdmissionWeight: 129 lb (58.5 kg)                 CurrentWeight: 129 lb (58.5 kg) Teresa Cook is a 61 y.o. old female seen in consultation for preop assessment at the request of Dr. Azalee Course     CHIEF COMPLAINT:   No acute issues   HISTORY OF PRESENT ILLNESS   61 yo AAF seen today for pre-op Pulmonary assessment Patient has multiple medical issues +CHF +ESRD on HD-MWF +Breast cancer surgery planned for next week for mastectomy She denies fevers, chills, no signs of infection Patient is compliant with HD She has no acute resp issues at this time  I have explained some of the risks of surgery and she was getting nervous, she asked me to stop talking about it She deneis   PAST MEDICAL HISTORY   Past Medical History:  Diagnosis Date  . Anemia   . Anxiety   . Breast cancer ([place])   . CHF (congestive heart failure) ([place])   . Chronic kidney disease   . Depression   . Dialysis patient ([place])   . DVT (deep venous thrombosis) ([facility])    left leg  . DVT (deep venous  thrombosis) ([place]) 1985   right thigh  . Gout   . Headache   . HTN (hypertension)   . Hypertension   . Parathyroid abnormality ([place])   . Parathyroid disease ([place])   . Pneumonia 12/2015  . Psoriasis   . Renal insufficiency   . Sickle cell trait ([place])    traits     SURGICAL HISTORY   Past Surgical History:  Procedure Laterality Date  . ABDOMINAL HYSTERECTOMY    . APPENDECTOMY    . BREAST BIOPSY Left 10/28/2013   benign  . BREAST EXCISIONAL BIOPSY Left 2002   benign  . INSERTION OF DIALYSIS CATHETER  2014  . PARTIAL HYSTERECTOMY    . PERIPHERAL VASCULAR CATHETERIZATION N/A 12/25/2015   Procedure: Dialysis/Perma Catheter Insertion;  Surgeon: Algernon Huxley, MD;  Location: [facility];  Service: Cardiovascular;  Laterality: N/A;  . PORT-A-CATH REMOVAL N/A 12/20/2015   Procedure: REMOVAL PORT-A-CATH;  Surgeon: Hubbard Robinson, MD;  Location: [facility];  Service: General;  Laterality: N/A;  left    . PORTACATH PLACEMENT Left 08/21/2015   Procedure: INSERTION PORT-A-CATH;  Surgeon: Hubbard Robinson, MD;  Location: [facility];  Service: General;  Laterality: Left;  .  REMOVAL OF A DIALYSIS CATHETER  2017     FAMILY HISTORY   Family History  Problem Relation Age of Onset  . Stroke Mother   . CVA Mother   . Hypertension Mother   . Hypertension Father   . Hypertension Sister   . Diabetes Sister   . Cancer Sister 84    Breast  . Stroke Brother   . Hypertension Brother   . Breast cancer Maternal Aunt 70     SOCIAL HISTORY   Social History  Substance Use Topics  . Smoking status: Never Smoker  . Smokeless tobacco: Never Used  . Alcohol use No     MEDICATIONS    Home Medication:  Current Outpatient Rx  . Order #: [id number] Class: OTC  . Order #: [id number] Class: Print  . Order #: [id number] Class: Historical Med  . Order #: [id number] Class: Historical Med  . Order #: [id number] Class: Normal  . Order #: [id number] Class: Historical Med  . Order #:  [id number] Class: Print  . Order #: [id number] Class: Historical Med  . Order #: [id number] Class: Print  . Order #: [id number] Class: Print  . Order #: [id number] Class: Historical Med  . Order #: [id number] Class: Historical Med  . Order #: [id number] Class: Print  . Order #: [id number] Class: Historical Med  . Order #: [id number] Class: Historical Med    Current Medication:  Current Outpatient Prescriptions:  .  acetaminophen (TYLENOL) 325 MG tablet, Take 2 tablets (650 mg total) by mouth every 6 (six) hours as needed for mild pain (or Fever >/= 101)., Disp: , Rfl:  .  ALPRAZolam (XANAX) 0.5 MG tablet, Take 1 tablet (0.5 mg total) by mouth 2 (two) times daily as needed for anxiety., Disp: 20 tablet, Rfl: 0 .  benzonatate (TESSALON) 200 MG capsule, Take 200 mg by mouth 3 (three) times daily as needed for cough., Disp: , Rfl:  .  clonazePAM (KLONOPIN) 1 MG tablet, Take 1 mg by mouth 2 (two) times daily after a meal., Disp: , Rfl:  .  feeding supplement, ENSURE ENLIVE, (ENSURE ENLIVE) LIQD, Take 237 mLs by mouth 3 (three) times daily between meals., Disp: 237 mL, Rfl: 12 .  gabapentin (NEURONTIN) 100 MG capsule, Take 100 mg by mouth daily., Disp: , Rfl:  .  levETIRAcetam (KEPPRA) 500 MG tablet, Take 1 tablet (500 mg total) by mouth 2 (two) times daily., Disp: 60 tablet, Rfl: 0 .  losartan (COZAAR) 50 MG tablet, Take 100 mg by mouth See admin instructions. Takes 100 MG DAILY on non-dialysis days=Tuesday, Thursday, Saturday and Sunday., Disp: , Rfl:  .  metoprolol (LOPRESSOR) 100 MG tablet, Take 0.5 tablets (50 mg total) by mouth 2 (two) times daily. Takes on non-dialysis days=Tuesday, Thursday, Saturday and Sunday. (Patient taking differently: Take 100 mg by mouth See admin instructions. Takes 100 MG DAILY on non-dialysis days=Tuesday, Thursday, Saturday and Sunday.), Disp: 60 tablet, Rfl: 0 .  Nutritional Supplements (FEEDING SUPPLEMENT, NEPRO CARB STEADY,) LIQD, Take 237 mLs by mouth 2 (two) times daily between  meals., Disp: 60 Can, Rfl: 0 .  pantoprazole (PROTONIX) 40 MG tablet, Take 40 mg by mouth as needed. , Disp: , Rfl:  .  sertraline (ZOLOFT) 100 MG tablet, Take 100 mg by mouth daily. , Disp: , Rfl:  .  traMADol (ULTRAM) 50 MG tablet, Take 1 tablet (50 mg total) by mouth every 6 (six) hours as needed for moderate pain., Disp: 20 tablet, Rfl: 0 .  warfarin (COUMADIN) 5 MG tablet, Take 1 tablet (5 MG )Friday, Saturday and Sundays. Take  1 1/2 tablets (7.5 MG) the other days of the week, Disp: , Rfl:  .  zolpidem (AMBIEN) 10 MG tablet, Take 10 mg by mouth at bedtime. , Disp: , Rfl:     ALLERGIES   Gabapentin and Adhesive [tape]     REVIEW OF SYSTEMS   Review of Systems  Constitutional: Negative for chills, diaphoresis, fever, malaise/fatigue and weight loss.  HENT: Negative for congestion and hearing loss.   Eyes: Negative for blurred vision and double vision.  Respiratory: Negative for cough, hemoptysis, sputum production, shortness of breath and wheezing.   Cardiovascular: Negative for chest pain, palpitations and orthopnea.  Gastrointestinal: Negative for abdominal pain, blood in stool, constipation, diarrhea, heartburn, nausea and vomiting.  Genitourinary: Negative for dysuria and urgency.  Musculoskeletal: Negative for back pain, myalgias and neck pain.  Skin: Negative for rash.  Neurological: Negative for dizziness, tingling, tremors, weakness and headaches.  Endo/Heme/Allergies: Does not bruise/bleed easily.  Psychiatric/Behavioral: Negative for depression, substance abuse and suicidal ideas.  All other systems reviewed and are negative.    VS: BP 140/88 (BP Location: Left Arm, Cuff Size: Normal)   Pulse (!) 104   Wt 129 lb (58.5 kg)   SpO2 99%   BMI 20.82 kg/m      PHYSICAL EXAM  Physical Exam  Constitutional: She is oriented to person, place, and time. She appears well-developed and well-nourished. No distress.  HENT:  Head: Normocephalic and atraumatic.    Mouth/Throat: No oropharyngeal exudate.  Eyes: EOM are normal. Pupils are equal, round, and reactive to light. No scleral icterus.  Neck: Normal range of motion. Neck supple.  Cardiovascular: Normal rate, regular rhythm and normal heart sounds.   No murmur heard. Pulmonary/Chest: No stridor. No respiratory distress. She has no wheezes.  Abdominal: Soft. Bowel sounds are normal.  Musculoskeletal: Normal range of motion. She exhibits no edema.  RT FEM PERM CATH  Neurological: She is alert and oriented to person, place, and time. She displays normal reflexes. Coordination normal.  Skin: Skin is warm. She is not diaphoretic.  Psychiatric: She has a normal mood and affect.        LABS    Recent Labs     01/15/16  2106  01/15/16  2137  HGB  9.3*   --   HCT  27.7*   --   MCV  84.8   --   WBC  4.5   --   BUN  14   --   CREATININE  3.84*   --   GLUCOSE  110*   --   CALCIUM  9.8   --   INR   --   2.64  ,       CULTURE RESULTS   Recent Results (from the past 240 hour(s))  Rapid Influenza A&B Antigens ([facility] only)     Status: None   Collection Time: 01/15/16  9:37 PM  Result Value Ref Range Status   Influenza A ([facility]) NEGATIVE NEGATIVE Final   Influenza B ([facility]) NEGATIVE NEGATIVE Final          IMAGING   CXR 01/15/15 No acute findings       ASSESSMENT/PLAN   61 yo AAF with multiple medical issues including CHF, ESRD on HD Patient is Low to Moderate Risk for post op Pulmonary Complications  Preoperative Pulmonary Risk Assessment Patient is proposed for b/l mastectomy surgery.   Patient has Definite risk factors for post-operative pulmonary complications include age >50,,CHF  high-risk surgical sites (thoracic)  -  patient is non smoker  -Patient has no signs or symptoms of asthma and COPD-PFT not indicated  - ABG is unlikely to aid in pre-op evaluation  General Risk Reduction Strategies: - All patients warrant post-operative incentive spirometry.  -  Early ambulation, PT/OT - DVT prophylaxis where appropriate - Adequate pain control without oversedation  Recommend Cardiac evaluation and assessment    I have personally obtained a history, examined the patient, evaluated laboratory and independently reviewed imaging results, formulated the assessment and plan and placed orders.  The Patient requires high complexity decision making for assessment and support, frequent evaluation and titration of therapies, application of advanced monitoring technologies and extensive interpretation of multiple databases.     Corrin Parker, M.D.  [facility]  Medical Director [place] Director [facility] Cardio-Pulmonary Department

## 2016-01-17 NOTE — Patient Instructions (Signed)
PATIENT IS LOW-MODERATE RISK FOR POST OP PULMONARY COMPLICATIONS  Total or Modified Radical Mastectomy, Care After Refer to this sheet in the next few weeks. These instructions provide you with information about caring for yourself after your procedure. Your health care provider may also give you more specific instructions. Your treatment has been planned according to current medical practices, but problems sometimes occur. Call your health care provider if you have any problems or questions after your procedure. What can I expect after the procedure? After your procedure, it is common to have:  Pain.  Numbness.  Stiffness in your arm or shoulder.  Feelings of stress, sadness, or depression. If the lymph nodes under your arm were removed, you may have arm swelling, weakness, or numbness on the same side of your body as your surgery. Follow these instructions at home: Incision care  There are many different ways to close and cover an incision, including stitches, skin glue, and adhesive strips. Follow your health care provider's instructions about:  Incision care.  Bandage (dressing) changes and removal.  Incision closure removal.  Check your incision area every day for signs of infection. Watch for:  Redness, swelling, or pain.  Fluid, blood, or pus.  If you were sent home with a surgical drain in place, follow your health care provider's instructions for emptying it. Bathing  Do not take baths, swim, or use a hot tub until your health care provider approves.  Take sponge baths until your health care provider says that you can start showering or bathing. Activity  Return to your normal activities as directed by your health care provider.  Avoid strenuous exercise.  Be careful to avoid any activities that could cause an injury to your arm on the side of your surgery.  Do not lift anything that is heavier than 10 lb (4.5 kg). Avoid lifting with the arm that is on the side  of your surgery.  Do not carry heavy objects on your shoulder.  After your drain is removed, you should perform exercises to keep your arm from getting stiff and swollen. Talk with your health care provider about which exercises are safe for you. General instructions  Take medicines only as directed by your health care provider.  You may eat what you usually do.  Keep your arm elevated when at rest.  Do not wear tight jewelry on your arm, wrist, or fingers on the side of your surgery.  Get checked for extra fluid around your lymph nodes (lymphedema) as often as told by your health care provider.  If you had a modified radical mastectomy, always let your health care providers know that lymph nodes under your arm were removed. This is important information to share before you are involved in certain procedures, such as giving blood or having your blood pressure taken. Contact a health care provider if:  You have a fever.  Your pain medicine is not working.  Your arm swelling, weakness, or numbness has not improved after a few weeks.  You have new swelling in your breast or arm.  You have redness, swelling, or pain in your incision area.  You have fluid, blood, or pus coming from your incision. Get help right away if:  You have very bad pain in your breast or arm.  You have chest pain.  You have difficulty breathing. This information is not intended to replace advice given to you by your health care provider. Make sure you discuss any questions you have with your  health care provider. Document Released: 08/17/2003 Document Revised: 08/31/2015 Document Reviewed: 09/08/2013 Elsevier Interactive Patient Education  2017 [employer].

## 2016-01-18 ENCOUNTER — Ambulatory Visit (INDEPENDENT_AMBULATORY_CARE_PROVIDER_SITE_OTHER): Payer: Medicare Other | Admitting: Cardiology

## 2016-01-18 ENCOUNTER — Encounter: Payer: Self-pay | Admitting: Cardiology

## 2016-01-18 ENCOUNTER — Ambulatory Visit (INDEPENDENT_AMBULATORY_CARE_PROVIDER_SITE_OTHER): Payer: Medicare Other

## 2016-01-18 ENCOUNTER — Other Ambulatory Visit: Payer: Self-pay

## 2016-01-18 ENCOUNTER — Ambulatory Visit: Payer: Medicare Other | Admitting: Cardiology

## 2016-01-18 VITALS — BP 150/102 | HR 102 | Ht 66.0 in | Wt 128.0 lb

## 2016-01-18 DIAGNOSIS — Z01818 Encounter for other preprocedural examination: Secondary | ICD-10-CM | POA: Diagnosis not present

## 2016-01-18 DIAGNOSIS — I5032 Chronic diastolic (congestive) heart failure: Secondary | ICD-10-CM | POA: Diagnosis not present

## 2016-01-18 DIAGNOSIS — I10 Essential (primary) hypertension: Secondary | ICD-10-CM

## 2016-01-18 DIAGNOSIS — Z136 Encounter for screening for cardiovascular disorders: Secondary | ICD-10-CM

## 2016-01-18 LAB — ECHOCARDIOGRAM COMPLETE
Height: 66 in
Weight: 2048 oz

## 2016-01-18 NOTE — Patient Instructions (Addendum)
Testing/Procedures: Your physician has requested that you have an echocardiogram. Echocardiography is a painless test that uses sound waves to create images of your heart. It provides your doctor with information about the size and shape of your heart and how well your heart's chambers and valves are working. This procedure takes approximately one hour. There are no restrictions for this procedure.    Follow-Up: Your physician wants you to follow-up in: 6 months with Dr. Yvone Neu. You will receive a reminder letter in the mail two months in advance. If you don't receive a letter, please call our office to schedule the follow-up appointment.  It was a pleasure seeing you today here in the office. Please do not hesitate to give [place] a call back if you have any further questions. [place], BSN    Echocardiogram An echocardiogram, or echocardiography, uses sound waves (ultrasound) to produce an image of your heart. The echocardiogram is simple, painless, obtained within a short period of time, and offers valuable information to your health care provider. The images from an echocardiogram can provide information such as:  Evidence of coronary artery disease (CAD).  Heart size.  Heart muscle function.  Heart valve function.  Aneurysm detection.  Evidence of a past heart attack.  Fluid buildup around the heart.  Heart muscle thickening.  Assess heart valve function. Tell a health care provider about:  Any allergies you have.  All medicines you are taking, including vitamins, herbs, eye drops, creams, and over-the-counter medicines.  Any problems you or family members have had with anesthetic medicines.  Any blood disorders you have.  Any surgeries you have had.  Any medical conditions you have.  Whether you are pregnant or may be pregnant. What happens before the procedure? No special preparation is needed. Eat and drink normally. What happens during the  procedure?  In order to produce an image of your heart, gel will be applied to your chest and a wand-like tool (transducer) will be moved over your chest. The gel will help transmit the sound waves from the transducer. The sound waves will harmlessly bounce off your heart to allow the heart images to be captured in real-time motion. These images will then be recorded.  You may need an IV to receive a medicine that improves the quality of the pictures. What happens after the procedure? You may return to your normal schedule including diet, activities, and medicines, unless your health care provider tells you otherwise. This information is not intended to replace advice given to you by your health care provider. Make sure you discuss any questions you have with your health care provider. Document Released: 12/22/1999 Document Revised: 08/12/2015 Document Reviewed: 08/31/2012 Elsevier Interactive Patient Education  2017 [employer].

## 2016-01-18 NOTE — Progress Notes (Signed)
Cardiology Office Note   Date:  01/18/2016   ID:  Teresa Cook, DOB 05-Apr-1955, MRN [id number]  Referring Doctor:  Murlean Iba, MD   Cardiologist:   Wende Bushy, MD   Reason for consultation:  Chief Complaint  Patient presents with  . other    Cardiac clearance no complaints today.  Meds reviewed verbally with pt.      History of Present Illness: Teresa Cook is a 61 y.o. female who presents forPreoperative evaluation prior to bilateral mastectomy.  Patient denies chest pain. She experiences shortness of breath only when she has volume overload. She has end-stage renal disease and is on dialysis. Other than that, she does not usually have shortness of breath. She is able to do her usual household chores, vacuuming, cleaning, climbing a flight of stairs without chest pain or shortness of breath.  She was recently diagnosed with breast cancer on the left side. She elected to proceed with bilateral mastectomy.  She denies fever, cough, colds, abdominal pain. No PND, orthopnea, edema.   ROS:  Please see the history of present illness. Aside from mentioned under HPI, all other systems are reviewed and negative.     Past Medical History:  Diagnosis Date  . Anemia   . Anxiety   . Breast cancer ([place])   . CHF (congestive heart failure) ([place])   . Chronic kidney disease   . Depression   . Dialysis patient ([place])   . DVT (deep venous thrombosis) ([facility])    left leg  . DVT (deep venous thrombosis) ([place]) 1985   right thigh  . Gout   . Headache   . HTN (hypertension)   . Hypertension   . Parathyroid abnormality ([place])   . Parathyroid disease ([place])   . Pneumonia 12/2015  . Psoriasis   . Renal insufficiency   . Sickle cell trait ([place])    traits    Past Surgical History:  Procedure Laterality Date  . ABDOMINAL HYSTERECTOMY    . APPENDECTOMY    . BREAST BIOPSY Left 10/28/2013   benign  . BREAST EXCISIONAL BIOPSY Left 2002   benign  . INSERTION OF DIALYSIS  CATHETER  2014  . PARTIAL HYSTERECTOMY    . PERIPHERAL VASCULAR CATHETERIZATION N/A 12/25/2015   Procedure: Dialysis/Perma Catheter Insertion;  Surgeon: Algernon Huxley, MD;  Location: [facility];  Service: Cardiovascular;  Laterality: N/A;  . PORT-A-CATH REMOVAL N/A 12/20/2015   Procedure: REMOVAL PORT-A-CATH;  Surgeon: Hubbard Robinson, MD;  Location: [facility];  Service: General;  Laterality: N/A;  left    . PORTACATH PLACEMENT Left 08/21/2015   Procedure: INSERTION PORT-A-CATH;  Surgeon: Hubbard Robinson, MD;  Location: [facility];  Service: General;  Laterality: Left;  . REMOVAL OF A DIALYSIS CATHETER  2017     reports that she has never smoked. She has never used smokeless tobacco. She reports that she does not drink alcohol or use drugs.   family history includes Breast cancer (age of onset: 29) in her maternal aunt; CVA in her mother; Cancer (age of onset: 17) in her sister; Diabetes in her sister; Hypertension in her brother, father, mother, and sister; Stroke in her brother and mother.   Outpatient Medications Prior to Visit  Medication Sig Dispense Refill  . acetaminophen (TYLENOL) 325 MG tablet Take 2 tablets (650 mg total) by mouth every 6 (six) hours as needed for mild pain (or Fever >/= 101).    Marland Kitchen ALPRAZolam (XANAX) 0.5 MG tablet Take 1  tablet (0.5 mg total) by mouth 2 (two) times daily as needed for anxiety. 20 tablet 0  . benzonatate (TESSALON) 200 MG capsule Take 200 mg by mouth 3 (three) times daily as needed for cough.    . feeding supplement, ENSURE ENLIVE, (ENSURE ENLIVE) LIQD Take 237 mLs by mouth 3 (three) times daily between meals. 237 mL 12  . levETIRAcetam (KEPPRA) 500 MG tablet Take 1 tablet (500 mg total) by mouth 2 (two) times daily. 60 tablet 0  . losartan (COZAAR) 50 MG tablet Take 100 mg by mouth See admin instructions. Takes 100 MG DAILY on non-dialysis days=Tuesday, Thursday, Saturday and Sunday.    . metoprolol (LOPRESSOR) 100 MG tablet Take 0.5  tablets (50 mg total) by mouth 2 (two) times daily. Takes on non-dialysis days=Tuesday, Thursday, Saturday and Sunday. (Patient taking differently: Take 100 mg by mouth See admin instructions. Takes 100 MG DAILY on non-dialysis days=Tuesday, Thursday, Saturday and Sunday.) 60 tablet 0  . pantoprazole (PROTONIX) 40 MG tablet Take 40 mg by mouth as needed.     . sertraline (ZOLOFT) 100 MG tablet Take 100 mg by mouth daily.     . traMADol (ULTRAM) 50 MG tablet Take 1 tablet (50 mg total) by mouth every 6 (six) hours as needed for moderate pain. 20 tablet 0  . warfarin (COUMADIN) 5 MG tablet Take 1 tablet (5 MG )Friday, Saturday and Sundays. Take 1 1/2 tablets (7.5 MG) the other days of the week    . zolpidem (AMBIEN) 10 MG tablet Take 10 mg by mouth at bedtime.     . clonazePAM (KLONOPIN) 1 MG tablet Take 1 mg by mouth 2 (two) times daily after a meal.    . gabapentin (NEURONTIN) 100 MG capsule Take 100 mg by mouth daily.    . Nutritional Supplements (FEEDING SUPPLEMENT, NEPRO CARB STEADY,) LIQD Take 237 mLs by mouth 2 (two) times daily between meals. (Patient not taking: Reported on 01/18/2016) 60 Can 0   No facility-administered medications prior to visit.      Allergies: Gabapentin and Adhesive [tape]    PHYSICAL EXAM: VS:  BP (!) 150/102 (BP Location: Right Arm, Patient Position: Sitting, Cuff Size: Normal)   Pulse (!) 102   Ht 5\' 6"  (1.676 m)   Wt 128 lb (58.1 kg)   BMI 20.66 kg/m  , Body mass index is 20.66 kg/m. Wt Readings from Last 3 Encounters:  01/18/16 128 lb (58.1 kg)  01/17/16 129 lb (58.5 kg)  01/16/16 129 lb (58.5 kg)    GENERAL:  well developed, well nourished, not in acute distress HEENT: normocephalic, pink conjunctivae, anicteric sclerae, no xanthelasma, normal dentition, oropharynx clear NECK:  no neck vein engorgement, JVP normal, no hepatojugular reflux, carotid upstroke brisk and symmetric, no bruit, no thyromegaly, no lymphadenopathy LUNGS:  good respiratory  effort, clear to auscultation bilaterally CV:  PMI not displaced, no thrills, no lifts, S1 and S2 within normal limits, no palpable S3 or S4, no murmurs, no rubs, no gallops ABD:  Soft, nontender, nondistended, normoactive bowel sounds, no abdominal aortic bruit, no hepatomegaly, no splenomegaly MS: nontender back, no kyphosis, no scoliosis, no joint deformities EXT:  2+ DP/PT pulses, no edema, no varicosities, no cyanosis, no clubbing SKIN: warm, nondiaphoretic, normal turgor, no ulcers NEUROPSYCH: alert, oriented to person, place, and time, sensory/motor grossly intact, normal mood, appropriate affect  Recent Labs: 08/31/2015: TSH 3.399 01/01/2016: Magnesium 1.9 01/15/2016: ALT 23; B Natriuretic Peptide 281.0; BUN 14; Creatinine, Ser 3.84; Hemoglobin 9.3; Platelets  174; Potassium 3.5; Sodium 141   Lipid Panel    Component Value Date/Time   CHOL 250 (H) 02/03/2014 0135   TRIG 144 02/03/2014 0135   HDL 35 (L) 02/03/2014 0135   VLDL 29 02/03/2014 0135   LDLCALC 186 (H) 02/03/2014 0135     Other studies Reviewed:  EKG:  The ekg from 01/18/2016 was personally reviewed by me and it revealed sinus tachycardia 102 BPM, LVH with repolarization abnormality. Similar to [facility] previously, including one done around time of stress test  Additional studies/ records that were reviewed personally reviewed by me today include:  Nuclear stress test 09/01/2015:  There was no ST segment deviation noted during stress.  The study is normal.  This is a low risk study.  The left ventricular ejection fraction is normal (55-65%).   Echocardiogram 07/11/2015: Left ventricle: The cavity size was normal. There was mild   concentric hypertrophy. Systolic function was mildly to   moderately reduced. The estimated ejection fraction was in the   range of 40% to 45%. Mild Diffuse hypokinesis. Regional wall   motion abnormalities cannot be excluded. Doppler parameters are   consistent with abnormal left  ventricular relaxation (grade 1   diastolic dysfunction). - Aortic valve: There was trivial regurgitation. - Mitral valve: There was mild regurgitation. - Left atrium: The atrium was normal in size. - Right ventricle: Systolic function was normal. - Pulmonary arteries: Systolic pressure was within the normal   range.  Impressions:  - EF estimate challenging in the setting of frequent ectopy  ASSESSMENT AND PLAN: Preoperative cardiac evaluation prior to surgery Patient does not have any chest pain. She does not appear to be in acute congestive heart failure. EKG shows normal rhythm. No significant changes from previous EKGs. Stress test August 2017 reveals no ischemia. She has had no new symptoms since then. Overall, patient is intermediate cardiac risk for moderate risk surgery. No indication for repeat stress testing at this point. Recommend echocardiogram to verify EF.  Hypertension Blood pressure of the little high today, she does get anxious when she comes in for medical visits. Continue current medications and BP log. Lifestyle modification recommended.  Tachycardia Sinus tachycardia. Again patient mentions that she usually gets anxious when she is in a doctor's office. This is reflected on previous vital signs with slight tachycardia. Continue to monitor for now.  History of congestive heart failure, diastolic dysfunction, chronic No evidence of acute decompensated congestive heart failure and then continue medical therapy. We will verify EF.  Current medicines are reviewed at length with the patient today.  The patient does not have concerns regarding medicines.  Labs/ tests ordered today include:  Orders Placed This Encounter  Procedures  . EKG 12-Lead  . ECHOCARDIOGRAM COMPLETE    I had a lengthy and detailed discussion with the patient regarding diagnoses, prognosis, diagnostic options, treatment options , and side effects of medications.   I counseled the  patient on importance of lifestyle modification including heart healthy diet, regular physical activity .   Disposition:   FU with undersigned In 6 months  Thank you for this consultation, we will be forwarding this consult note to referring physician.  I spent at least 45 minutes with the patient today and more than 50% of the time was spent counseling the patient and coordinating care.     Signed, Wende Bushy, MD  01/18/2016 2:32 PM    [place]  This note was generated in part with voice  recognition software and I apologize for any typographical errors that were not detected and corrected.

## 2016-01-19 ENCOUNTER — Telehealth: Payer: Self-pay

## 2016-01-19 NOTE — Telephone Encounter (Signed)
Pulmonary Risk assessment has been received from Dr Mortimer Fries. Please see note.    Recommends Cardiac evaluation.  Cardiac Clearance faxed to [facility] on 01/11/16.

## 2016-01-22 ENCOUNTER — Encounter
Admission: AD | Disposition: A | Discharge status: Home Health | Payer: Self-pay | Source: Ambulatory Visit | Attending: Surgery

## 2016-01-22 ENCOUNTER — Telehealth: Payer: Self-pay

## 2016-01-22 ENCOUNTER — Inpatient Hospital Stay
Admission: AD | Admit: 2016-01-22 | Discharge: 2016-01-29 | DRG: 264 | Disposition: A | Discharge status: Home Health | Payer: Medicare Other | Source: Ambulatory Visit | Attending: Surgery | Admitting: Surgery

## 2016-01-22 ENCOUNTER — Encounter: Payer: Self-pay | Admitting: Physician Assistant

## 2016-01-22 ENCOUNTER — Telehealth: Payer: Self-pay | Admitting: Cardiology

## 2016-01-22 DIAGNOSIS — Y828 Other medical devices associated with adverse incidents: Secondary | ICD-10-CM | POA: Diagnosis present

## 2016-01-22 DIAGNOSIS — D62 Acute posthemorrhagic anemia: Secondary | ICD-10-CM | POA: Diagnosis not present

## 2016-01-22 DIAGNOSIS — D631 Anemia in chronic kidney disease: Secondary | ICD-10-CM | POA: Diagnosis present

## 2016-01-22 DIAGNOSIS — I120 Hypertensive chronic kidney disease with stage 5 chronic kidney disease or end stage renal disease: Secondary | ICD-10-CM | POA: Diagnosis present

## 2016-01-22 DIAGNOSIS — Z8249 Family history of ischemic heart disease and other diseases of the circulatory system: Secondary | ICD-10-CM

## 2016-01-22 DIAGNOSIS — E213 Hyperparathyroidism, unspecified: Secondary | ICD-10-CM | POA: Diagnosis present

## 2016-01-22 DIAGNOSIS — L723 Sebaceous cyst: Secondary | ICD-10-CM

## 2016-01-22 DIAGNOSIS — N2581 Secondary hyperparathyroidism of renal origin: Secondary | ICD-10-CM | POA: Diagnosis present

## 2016-01-22 DIAGNOSIS — F419 Anxiety disorder, unspecified: Secondary | ICD-10-CM | POA: Diagnosis present

## 2016-01-22 DIAGNOSIS — D170 Benign lipomatous neoplasm of skin and subcutaneous tissue of head, face and neck: Secondary | ICD-10-CM | POA: Diagnosis present

## 2016-01-22 DIAGNOSIS — D0512 Intraductal carcinoma in situ of left breast: Secondary | ICD-10-CM | POA: Diagnosis present

## 2016-01-22 DIAGNOSIS — L409 Psoriasis, unspecified: Secondary | ICD-10-CM

## 2016-01-22 DIAGNOSIS — F329 Major depressive disorder, single episode, unspecified: Secondary | ICD-10-CM | POA: Diagnosis present

## 2016-01-22 DIAGNOSIS — T82868A Thrombosis of vascular prosthetic devices, implants and grafts, initial encounter: Secondary | ICD-10-CM | POA: Diagnosis present

## 2016-01-22 DIAGNOSIS — C50011 Malignant neoplasm of nipple and areola, right female breast: Secondary | ICD-10-CM | POA: Diagnosis not present

## 2016-01-22 DIAGNOSIS — Z853 Personal history of malignant neoplasm of breast: Secondary | ICD-10-CM

## 2016-01-22 DIAGNOSIS — Z823 Family history of stroke: Secondary | ICD-10-CM

## 2016-01-22 DIAGNOSIS — Z833 Family history of diabetes mellitus: Secondary | ICD-10-CM

## 2016-01-22 DIAGNOSIS — C50919 Malignant neoplasm of unspecified site of unspecified female breast: Secondary | ICD-10-CM | POA: Diagnosis present

## 2016-01-22 DIAGNOSIS — L989 Disorder of the skin and subcutaneous tissue, unspecified: Secondary | ICD-10-CM

## 2016-01-22 DIAGNOSIS — Z79891 Long term (current) use of opiate analgesic: Secondary | ICD-10-CM

## 2016-01-22 DIAGNOSIS — Z803 Family history of malignant neoplasm of breast: Secondary | ICD-10-CM

## 2016-01-22 DIAGNOSIS — I959 Hypotension, unspecified: Secondary | ICD-10-CM | POA: Diagnosis not present

## 2016-01-22 DIAGNOSIS — N6011 Diffuse cystic mastopathy of right breast: Secondary | ICD-10-CM | POA: Diagnosis present

## 2016-01-22 DIAGNOSIS — G40909 Epilepsy, unspecified, not intractable, without status epilepticus: Secondary | ICD-10-CM | POA: Diagnosis present

## 2016-01-22 DIAGNOSIS — Z888 Allergy status to other drugs, medicaments and biological substances status: Secondary | ICD-10-CM

## 2016-01-22 DIAGNOSIS — I10 Essential (primary) hypertension: Secondary | ICD-10-CM | POA: Diagnosis not present

## 2016-01-22 DIAGNOSIS — N186 End stage renal disease: Secondary | ICD-10-CM | POA: Diagnosis present

## 2016-01-22 DIAGNOSIS — Z86718 Personal history of other venous thrombosis and embolism: Secondary | ICD-10-CM

## 2016-01-22 DIAGNOSIS — T82898A Other specified complication of vascular prosthetic devices, implants and grafts, initial encounter: Secondary | ICD-10-CM | POA: Diagnosis present

## 2016-01-22 DIAGNOSIS — C50012 Malignant neoplasm of nipple and areola, left female breast: Secondary | ICD-10-CM | POA: Diagnosis not present

## 2016-01-22 DIAGNOSIS — Z91048 Other nonmedicinal substance allergy status: Secondary | ICD-10-CM

## 2016-01-22 DIAGNOSIS — Z79899 Other long term (current) drug therapy: Secondary | ICD-10-CM | POA: Diagnosis not present

## 2016-01-22 DIAGNOSIS — Z7901 Long term (current) use of anticoagulants: Secondary | ICD-10-CM

## 2016-01-22 DIAGNOSIS — X58XXXA Exposure to other specified factors, initial encounter: Secondary | ICD-10-CM

## 2016-01-22 DIAGNOSIS — I132 Hypertensive heart and chronic kidney disease with heart failure and with stage 5 chronic kidney disease, or end stage renal disease: Secondary | ICD-10-CM

## 2016-01-22 DIAGNOSIS — C50311 Malignant neoplasm of lower-inner quadrant of right female breast: Secondary | ICD-10-CM

## 2016-01-22 DIAGNOSIS — C50312 Malignant neoplasm of lower-inner quadrant of left female breast: Secondary | ICD-10-CM

## 2016-01-22 DIAGNOSIS — Z992 Dependence on renal dialysis: Secondary | ICD-10-CM | POA: Diagnosis present

## 2016-01-22 DIAGNOSIS — D573 Sickle-cell trait: Secondary | ICD-10-CM | POA: Diagnosis present

## 2016-01-22 HISTORY — PX: PERIPHERAL VASCULAR CATHETERIZATION: SHX172C

## 2016-01-22 HISTORY — DX: Cardiac arrhythmia, unspecified: I49.9

## 2016-01-22 LAB — CBC
HCT: 30.5 % — ABNORMAL LOW (ref 35.0–47.0)
Hemoglobin: 10.4 g/dL — ABNORMAL LOW (ref 12.0–16.0)
MCH: 29.1 pg (ref 26.0–34.0)
MCHC: 34 g/dL (ref 32.0–36.0)
MCV: 85.5 fL (ref 80.0–100.0)
PLATELETS: 213 10*3/uL (ref 150–440)
RBC: 3.57 MIL/uL — AB (ref 3.80–5.20)
RDW: 17.2 % — AB (ref 11.5–14.5)
WBC: 7.5 10*3/uL (ref 3.6–11.0)

## 2016-01-22 LAB — BASIC METABOLIC PANEL
Anion gap: 12 (ref 5–15)
BUN: 48 mg/dL — ABNORMAL HIGH (ref 6–20)
CALCIUM: 10 mg/dL (ref 8.9–10.3)
CHLORIDE: 102 mmol/L (ref 101–111)
CO2: 23 mmol/L (ref 22–32)
CREATININE: 8.63 mg/dL — AB (ref 0.44–1.00)
GFR calc non Af Amer: 4 mL/min — ABNORMAL LOW (ref >60)
GFR, EST AFRICAN AMERICAN: 5 mL/min — AB (ref >60)
Glucose, Bld: 98 mg/dL (ref 65–99)
Potassium: 3.9 mmol/L (ref 3.5–5.1)
SODIUM: 137 mmol/L (ref 135–145)

## 2016-01-22 LAB — PROTIME-INR
INR: 1.47
PROTHROMBIN TIME: 18 s — AB (ref 11.4–15.2)

## 2016-01-22 SURGERY — DIALYSIS/PERMA CATHETER INSERTION
Anesthesia: LOCAL | Laterality: Left

## 2016-01-22 SURGERY — DIALYSIS/PERMA CATHETER INSERTION
Anesthesia: LOCAL

## 2016-01-22 MED ORDER — TRAMADOL HCL 50 MG PO TABS
50.0000 mg | ORAL_TABLET | Freq: Four times a day (QID) | ORAL | Status: DC | PRN
  Administered 2016-01-26: 50 mg via ORAL

## 2016-01-22 MED ORDER — ONDANSETRON HCL 4 MG PO TABS: 4.0000 mg | ORAL_TABLET | Freq: Four times a day (QID) | ORAL | Status: DC | PRN

## 2016-01-22 MED ORDER — HYDROCODONE-ACETAMINOPHEN 5-325 MG PO TABS
1.0000 | ORAL_TABLET | ORAL | Status: DC | PRN
  Administered 2016-01-22: 2 via ORAL
  Filled 2016-01-22: qty 2

## 2016-01-22 MED ORDER — SERTRALINE HCL 100 MG PO TABS
100.0000 mg | ORAL_TABLET | Freq: Every day | ORAL | Status: DC
  Administered 2016-01-24 – 2016-01-28 (×4): 100 mg via ORAL
  Filled 2016-01-22 (×4): qty 1

## 2016-01-22 MED ORDER — METOPROLOL TARTRATE 25 MG PO TABS
25.0000 mg | ORAL_TABLET | ORAL | Status: DC
  Administered 2016-01-22: 25 mg via ORAL
  Filled 2016-01-22 (×3): qty 1

## 2016-01-22 MED ORDER — ALPRAZOLAM 0.5 MG PO TABS: 0.5000 mg | ORAL_TABLET | Freq: Two times a day (BID) | ORAL | Status: DC | PRN

## 2016-01-22 MED ORDER — METOPROLOL TARTRATE 50 MG PO TABS: 50.0000 mg | ORAL_TABLET | Freq: Two times a day (BID) | ORAL | Status: DC

## 2016-01-22 MED ORDER — SODIUM CHLORIDE 0.9 % IV SOLN
250.0000 mL | INTRAVENOUS | Status: DC | PRN
  Administered 2016-01-23: 11:00:00 via INTRAVENOUS

## 2016-01-22 MED ORDER — SODIUM CHLORIDE 0.9% FLUSH
3.0000 mL | Freq: Two times a day (BID) | INTRAVENOUS | Status: DC
  Administered 2016-01-24 – 2016-01-28 (×7): 3 mL via INTRAVENOUS

## 2016-01-22 MED ORDER — LOSARTAN POTASSIUM 50 MG PO TABS
100.0000 mg | ORAL_TABLET | ORAL | Status: DC
  Administered 2016-01-25: 100 mg via ORAL
  Filled 2016-01-22: qty 2

## 2016-01-22 MED ORDER — ACETAMINOPHEN 650 MG RE SUPP: 650.0000 mg | Freq: Four times a day (QID) | RECTAL | Status: DC | PRN

## 2016-01-22 MED ORDER — BENZONATATE 100 MG PO CAPS: 200.0000 mg | ORAL_CAPSULE | Freq: Three times a day (TID) | ORAL | Status: DC | PRN

## 2016-01-22 MED ORDER — LIDOCAINE HCL (PF) 1 % IJ SOLN
INTRAMUSCULAR | Status: DC | PRN
  Administered 2016-01-22: 5 mL via INTRADERMAL

## 2016-01-22 MED ORDER — ACETAMINOPHEN 325 MG PO TABS: 650.0000 mg | ORAL_TABLET | Freq: Four times a day (QID) | ORAL | Status: DC | PRN

## 2016-01-22 MED ORDER — SODIUM CHLORIDE 0.9% FLUSH
3.0000 mL | INTRAVENOUS | Status: DC | PRN
Start: 2016-01-22 — End: 2016-01-29

## 2016-01-22 MED ORDER — ACETAMINOPHEN 325 MG PO TABS
650.0000 mg | ORAL_TABLET | Freq: Four times a day (QID) | ORAL | Status: DC | PRN
  Filled 2016-01-22: qty 2

## 2016-01-22 MED ORDER — METOPROLOL TARTRATE 50 MG PO TABS
50.0000 mg | ORAL_TABLET | ORAL | Status: DC
  Administered 2016-01-23 (×2): 50 mg via ORAL
  Filled 2016-01-22 (×4): qty 1

## 2016-01-22 MED ORDER — ONDANSETRON HCL 4 MG/2ML IJ SOLN: 4.0000 mg | Freq: Four times a day (QID) | INTRAMUSCULAR | Status: DC | PRN

## 2016-01-22 MED ORDER — PANTOPRAZOLE SODIUM 40 MG PO TBEC
40.0000 mg | DELAYED_RELEASE_TABLET | Freq: Every day | ORAL | Status: DC
  Administered 2016-01-24 – 2016-01-28 (×4): 40 mg via ORAL
  Filled 2016-01-22 (×4): qty 1

## 2016-01-22 MED ORDER — ENSURE ENLIVE PO LIQD
237.0000 mL | Freq: Three times a day (TID) | ORAL | Status: DC
  Administered 2016-01-25 – 2016-01-29 (×9): 237 mL via ORAL

## 2016-01-22 MED ORDER — ZOLPIDEM TARTRATE 5 MG PO TABS
5.0000 mg | ORAL_TABLET | Freq: Every evening | ORAL | Status: DC | PRN
  Administered 2016-01-22: 5 mg via ORAL
  Filled 2016-01-22: qty 1

## 2016-01-22 MED ORDER — TRAMADOL HCL 50 MG PO TABS: 50.0000 mg | ORAL_TABLET | Freq: Four times a day (QID) | ORAL | Status: DC | PRN

## 2016-01-22 MED ORDER — LEVETIRACETAM 500 MG PO TABS
500.0000 mg | ORAL_TABLET | Freq: Two times a day (BID) | ORAL | Status: DC
  Administered 2016-01-22 – 2016-01-28 (×9): 500 mg via ORAL
  Filled 2016-01-22 (×10): qty 1

## 2016-01-22 SURGICAL SUPPLY — 1 items: KIT DIALYSIS CATH TRI 30X13 (CATHETERS) ×2 IMPLANT

## 2016-01-22 NOTE — Progress Notes (Signed)
    61 year old female with history of chronic combined CHF, ESRD on dialysis, prior DVT, HTN, headache disorder, and breast cancer scheduled to undergo bilateral mastectomy. Patient recently seen for pre-operative cardiac evaluation on 01/18/2016. At that time it was noted patient has been withotu chest pain and only with some SOB with volume overload. Her volume status is managed through dialysis secondary to ESRD. She was noted at that time to have a functional capacity of at least 4 METs. Recent nuclear stress test in 08/2015 low risk with EF estimated at 55-65%. Prior echo in 07/2015 showed EF of 40-45%, mild diffuse hypokinesis, grade 1 diastolic dysfunction, mild mitral regurgitation, trivial tricuspid regurgitation, normal right ventricle size and function, right-side pressure normal. Most recent echo from 01/18/2016 showed no significant change from prior with a stable EF of 40-45%, diffuse hypokinesis, grade 1 diastolic dysfunction, mild to moderate mitral regurgitation, and moderate tricuspid regurgitation.     PREOPERATIVE CARDIAC RISK ASSESSMENT:   Revised Cardiac Risk Index:  High Risk Surgery (defined as Intraperitoneal, intrathoracic or suprainguinal vascular): no; (bilateral mastectomy)  Active CAD: no  CHF: yes  Cerebrovascular Disease: no  Diabetes: no; On Insulin: no  CKD (Cr >~ 2): yes  Estimated Risk of Adverse Outcome: moderate risk for moderate risk surgery. Estimated Risk of MI, PE, VF/VT (Cardiac Arrest), Complete Heart Block: 6.6 %   ACC/AHA Guidelines for "Clearance":  Step 1 - Need for Emergency Surgery: no  If Yes - go straight to OR with perioperative surveillance  Step 2 - Active Cardiac Conditions (Unstable Angina, Decompensated HF, Significant  Arrhytmias - Complete HB, Mobitz II, Symptomatic VT or SVT, Severe Aortic Stenosis - mean gradient > 40 mmHg, Valve area < 1.0 cm2): no  If Yes - Evaluate & Treat per ACC/AHA Guidelines  Step 3 -  Low Risk  Surgery: no (moderate)  If Yes --> proceed to OR  If No --> Step 4  Step 4 - Functional Capacity >= 4 METS without symptoms: yes  If Yes --> proceed to OR  If No --> Step 5  Step 5 --  Clinical Risk Factors (CRF)  - Zero --> proceed to OR    Based on recent low risk nuclear stress test in 08/2015, stable EF documented on echo 01/2016, patient having a functional capacity of at least 4.0 METs, and patient being asymptomatic she is cleared for non-cardiac surgery at moderate risk.    Signed,  Christell Faith, PA-C 01/22/2016 3:15 PM

## 2016-01-22 NOTE — Pre-Procedure Instructions (Signed)
ASSESSMENT AND PLAN: Preoperative cardiac evaluation prior to surgery Patient does not have any chest pain. She does not appear to be in acute congestive heart failure. EKG shows normal rhythm. No significant changes from previous EKGs. Stress test August 2017 reveals no ischemia. She has had no new symptoms since then. Overall, patient is intermediate cardiac risk for moderate risk surgery. No indication for repeat stress testing at this point. Recommend echocardiogram to verify EF.  Hypertension Blood pressure of the little high today, she does get anxious when she comes in for medical visits. Continue current medications and BP log. Lifestyle modification recommended.  Tachycardia Sinus tachycardia. Again patient mentions that she usually gets anxious when she is in a doctor's office. This is reflected on previous vital signs with slight tachycardia. Continue to monitor for now.  History of congestive heart failure, diastolic dysfunction, chronic No evidence of acute decompensated congestive heart failure and then continue medical therapy. We will verify EF.  Current medicines are reviewed at length with the patient today.  The patient does not have concerns regarding medicines.  Signed, Wende Bushy, MD  01/18/2016 2:32 PM    [place]

## 2016-01-22 NOTE — Consult Note (Signed)
[facility] Consult Note  MRN : [id number]  Teresa Cook is a 61 y.o. (1955/09/13) female who presents with chief complaint of No chief complaint on file. Marland Kitchen  History of Present Illness: Patient is admitted to the hospital with a nonfunctional PermCath and end-stage renal disease. She is scheduled for a left mastectomy tomorrow for breast cancer and will need dialysis access and IV access around the time of surgery.  The patient reports no uremic symptoms, no itching, no anorexia, no volume overload symptoms.  The patient is somewhat complex with her breast cancer requiring mastectomy which precludes left jugular access until this is done. Her right jugular vein was found to be occluded several weeks ago minutes while she had a femoral PermCath placed. I am asked by Dr. Juleen China to see the patient today to help with IV and dialysis access during her hospitalization.   Current Facility-Administered Medications  Medication Dose Route Frequency Provider Last Rate Last Dose  . 0.9 %  sodium chloride infusion  250 mL Intravenous PRN Dustin Flock, MD      . acetaminophen (TYLENOL) tablet 650 mg  650 mg Oral Q6H PRN Dustin Flock, MD       Or  . acetaminophen (TYLENOL) suppository 650 mg  650 mg Rectal Q6H PRN Dustin Flock, MD      . ALPRAZolam Duanne Moron) tablet 0.5 mg  0.5 mg Oral BID PRN Dustin Flock, MD      . benzonatate (TESSALON) capsule 200 mg  200 mg Oral TID PRN Dustin Flock, MD      . feeding supplement (ENSURE ENLIVE) (ENSURE ENLIVE) liquid 237 mL  237 mL Oral TID BM Dustin Flock, MD      . HYDROcodone-acetaminophen (NORCO/VICODIN) 5-325 MG per tablet 1-2 tablet  1-2 tablet Oral Q4H PRN Dustin Flock, MD      . levETIRAcetam (KEPPRA) tablet 500 mg  500 mg Oral BID Dustin Flock, MD      . Derrill Memo ON 01/23/2016] losartan (COZAAR) tablet 100 mg  100 mg Oral Once per day on Sun Tue Thu Sat Dustin Flock, MD      . metoprolol (LOPRESSOR) tablet 50 mg   50 mg Oral BID Dustin Flock, MD      . ondansetron [facility]) tablet 4 mg  4 mg Oral Q6H PRN Dustin Flock, MD       Or  . ondansetron (ZOFRAN) injection 4 mg  4 mg Intravenous Q6H PRN Dustin Flock, MD      . Derrill Memo ON 01/23/2016] pantoprazole (PROTONIX) EC tablet 40 mg  40 mg Oral Daily Dustin Flock, MD      . Derrill Memo ON 01/23/2016] sertraline (ZOLOFT) tablet 100 mg  100 mg Oral Daily Dustin Flock, MD      . sodium chloride flush (NS) 0.9 % injection 3 mL  3 mL Intravenous Q12H Dustin Flock, MD      . sodium chloride flush (NS) 0.9 % injection 3 mL  3 mL Intravenous PRN Dustin Flock, MD      . traMADol (ULTRAM) tablet 50 mg  50 mg Oral Q6H PRN Dustin Flock, MD      . traMADol (ULTRAM) tablet 50 mg  50 mg Oral Q6H PRN Algernon Huxley, MD      . zolpidem (AMBIEN) tablet 5 mg  5 mg Oral QHS PRN Dustin Flock, MD        Past Medical History:  Diagnosis Date  . Anemia   . Anxiety   .  Breast cancer ([place])   . CHF (congestive heart failure) ([place])   . Chronic kidney disease   . Depression   . Dialysis patient ([place])   . DVT (deep venous thrombosis) ([facility])    left leg  . DVT (deep venous thrombosis) ([place]) 1985   right thigh  . Gout   . Headache   . HTN (hypertension)   . Hypertension   . Parathyroid abnormality ([place])   . Parathyroid disease ([place])   . Pneumonia 12/2015  . Psoriasis   . Renal insufficiency   . Sickle cell trait ([place])    traits    Past Surgical History:  Procedure Laterality Date  . ABDOMINAL HYSTERECTOMY    . APPENDECTOMY    . BREAST BIOPSY Left 10/28/2013   benign  . BREAST EXCISIONAL BIOPSY Left 2002   benign  . INSERTION OF DIALYSIS CATHETER  2014  . PARTIAL HYSTERECTOMY    . PERIPHERAL VASCULAR CATHETERIZATION N/A 12/25/2015   Procedure: Dialysis/Perma Catheter Insertion;  Surgeon: Algernon Huxley, MD;  Location: [facility];  Service: Cardiovascular;  Laterality: N/A;  . PORT-A-CATH REMOVAL N/A 12/20/2015   Procedure: REMOVAL PORT-A-CATH;   Surgeon: Hubbard Robinson, MD;  Location: [facility];  Service: General;  Laterality: N/A;  left    . PORTACATH PLACEMENT Left 08/21/2015   Procedure: INSERTION PORT-A-CATH;  Surgeon: Hubbard Robinson, MD;  Location: [facility];  Service: General;  Laterality: Left;  . REMOVAL OF A DIALYSIS CATHETER  2017    Social History Social History  Substance Use Topics  . Smoking status: Never Smoker  . Smokeless tobacco: Never Used  . Alcohol use No  No IV drug use  Family History Family History  Problem Relation Age of Onset  . Stroke Mother   . CVA Mother   . Hypertension Mother   . Hypertension Father   . Hypertension Sister   . Diabetes Sister   . Cancer Sister 60    Breast  . Stroke Brother   . Hypertension Brother   . Breast cancer Maternal Aunt 70    Allergies  Allergen Reactions  . Gabapentin Other (See Comments)    Seizure  . Adhesive [Tape] Itching    Silk tape is ok to use.     REVIEW OF SYSTEMS (Negative unless checked)  Constitutional: [] Weight loss  [] Fever  [] Chills Cardiac: [] Chest pain   [] Chest pressure   [] Palpitations   [] Shortness of breath when laying flat   [] Shortness of breath at rest   [] Shortness of breath with exertion. Vascular:  [] Pain in legs with walking   [] Pain in legs at rest   [] Pain in legs when laying flat   [] Claudication   [] Pain in feet when walking  [] Pain in feet at rest  [] Pain in feet when laying flat   [x] History of DVT   [] Phlebitis   [] Swelling in legs   [] Varicose veins   [] Non-healing ulcers Pulmonary:   [] Uses home oxygen   [] Productive cough   [] Hemoptysis   [] Wheeze  [] COPD   [] Asthma Neurologic:  [] Dizziness  [] Blackouts   [] Seizures   [] History of stroke   [] History of TIA  [] Aphasia   [] Temporary blindness   [] Dysphagia   [] Weakness or numbness in arms   [] Weakness or numbness in legs Musculoskeletal:  [] Arthritis   [] Joint swelling   [] Joint pain   [] Low back pain Hematologic:  [] Easy bruising  [] Easy bleeding    [] Hypercoagulable state   [] Anemic  [] Hepatitis Gastrointestinal:  []   Blood in stool   [] Vomiting blood  [] Gastroesophageal reflux/heartburn   [] Difficulty swallowing. Genitourinary:  [x] Chronic kidney disease   [] Difficult urination  [] Frequent urination  [] Burning with urination   [] Blood in urine Skin:  [] Rashes   [] Ulcers   [] Wounds Psychological:  [] History of anxiety   []  History of major depression.       Physical Examination  Vitals:   01/22/16 1603  BP: (!) 173/93  Pulse: 75  Resp: 20  Temp: 98.2 F (36.8 C)  TempSrc: Oral  SpO2: 100%   There is no height or weight on file to calculate BMI. Gen: WD/WN Head: [place]/AT, No temporalis wasting Ear/Nose/Throat: Hearing grossly intact, nares w/o erythema or drainage Eyes: Sclera non-icteric, conjunctiva clear Neck: Supple, no nuchal rigidity.  No JVD.  Pulmonary:  Good air movement, no use of accessory muscles Cardiac: RRR, normal S1, S2, no Murmurs, rubs or gallops. Vascular: Right femoral PermCath is in place. Gastrointestinal: soft, non-tender/non-distended. No guarding/reflex.  Musculoskeletal: M/S 5/5 throughout.  Extremities without ischemic changes.  No deformity or atrophy. Mild Edema in the lower extremities bilaterally Neurologic: Moves all 4 extremities normally, no focal deficits appreciated Psychiatric: Normal affect and mood Dermatologic: No rashes or ulcers noted.   Lymph : No Cervical, Axillary, or Inguinal lymphadenopathy.      CBC Lab Results  Component Value Date   WBC 7.5 01/22/2016   HGB 10.4 (L) 01/22/2016   HCT 30.5 (L) 01/22/2016   MCV 85.5 01/22/2016   PLT 213 01/22/2016    BMET    Component Value Date/Time   NA 137 01/22/2016 1549   NA 138 02/03/2014 0135   K 3.9 01/22/2016 1549   K 4.0 02/03/2014 0135   CL 102 01/22/2016 1549   CL 102 02/03/2014 0135   CO2 23 01/22/2016 1549   CO2 23 02/03/2014 0135   GLUCOSE 98 01/22/2016 1549   GLUCOSE 101 (H) 02/03/2014 0135   BUN 48 (H)  01/22/2016 1549   BUN 21 (H) 02/03/2014 0135   CREATININE 8.63 (H) 01/22/2016 1549   CREATININE 5.11 (H) 02/03/2014 0135   CALCIUM 10.0 01/22/2016 1549   CALCIUM 8.6 02/03/2014 0135   GFRNONAA 4 (L) 01/22/2016 1549   GFRNONAA 9 (L) 02/03/2014 0135   GFRNONAA 6 (L) 09/08/2013 0133   GFRAA 5 (L) 01/22/2016 1549   GFRAA 11 (L) 02/03/2014 0135   GFRAA 7 (L) 09/08/2013 0133   Estimated Creatinine Clearance: 6.2 mL/min (by C-G formula based on SCr of 8.63 mg/dL (H)).  COAG Lab Results  Component Value Date   INR 1.47 01/22/2016   INR 2.64 01/15/2016   INR 2.13 01/02/2016    Radiology Dg Chest 2 View  Result Date: 01/15/2016 CLINICAL DATA:  Weakness and shortness of breath after dialysis EXAM: CHEST  2 VIEW COMPARISON:  12/24/2015 FINDINGS: Lungs are hyperinflated. Ascending catheter tip overlies the low right atrium. There is no acute consolidation or effusion. Stable cardiomediastinal silhouette with atherosclerosis. No pneumothorax. Stable kyphosis. IMPRESSION: 1. No acute infiltrate or edema 2. Atherosclerosis of the aorta Electronically Signed   By: Donavan Foil M.D.   On: 01/15/2016 21:39   Dg Chest 2 View  Result Date: 12/24/2015 CLINICAL DATA:  Generalized weakness.  Breast cancer.  CHF, asthma EXAM: CHEST  2 VIEW COMPARISON:  12/21/2015 FINDINGS: Heart is upper limits normal in size. Calcifications in the aortic arch. Lungs are clear. No effusions or edema. No acute bony abnormality. IMPRESSION: No active cardiopulmonary disease. Electronically Signed   By:  Rolm Baptise M.D.   On: 12/24/2015 11:48   Ct Head Wo Contrast  Result Date: 12/31/2015 CLINICAL DATA:  Seizure after dialysis. EXAM: CT HEAD WITHOUT CONTRAST TECHNIQUE: Contiguous axial images were obtained from the base of the skull through the vertex without intravenous contrast. COMPARISON:  02/02/2014 FINDINGS: Brain: No evidence of acute infarction, hemorrhage, hydrocephalus, or extra-axial collection. 1.5 x 1.5 cm  partially calcified extra-axial mass seen along the right tentorium, abutting the right cerebellar cortex. The mass is not well visualized, however appears increased from the prior CT dated 02/02/2014, when it measured 1.4 x 1.1 cm. Vascular: No hyperdense vessels. Calcific atherosclerotic disease at the skullbase. Skull: Negative for fracture or focal lesion. Hyperdense appearance of the osseous structures, likely related to chronic renal disease. Sinuses/Orbits: Polypoid mucosal thickening of the right maxillary sinus and right sphenoid sinus. Minimal polypoid mucosal thickening of bilateral ethmoid sinuses. Partial opacification of the bilateral mastoid air cells. Other: None IMPRESSION: No evident of acute infarction or hemorrhage. Apparent increase in the size of known partially calcified extra-axial mass along the right tentorium. Nonemergent evaluation with contrast-enhanced brain MRI may provide better characterisation of this lesion. Electronically Signed   By: Fidela Salisbury M.D.   On: 12/31/2015 17:24   Ct Angio Chest Pe W And/or Wo Contrast  Result Date: 01/15/2016 CLINICAL DATA:  Diarrhea since yesterday. Dialysis today. Weakness and shortness of breath. EXAM: CT ANGIOGRAPHY CHEST WITH CONTRAST TECHNIQUE: Multidetector CT imaging of the chest was performed using the standard protocol during bolus administration of intravenous contrast. Multiplanar CT image reconstructions and MIPs were obtained to evaluate the vascular anatomy. CONTRAST:  75 mL Isovue 370 COMPARISON:  08/31/2015 FINDINGS: Cardiovascular: Satisfactory opacification of the pulmonary arteries to the segmental level. No evidence of pulmonary embolism. Mild cardiac enlargement. No pericardial effusion. Ascending thoracic aorta measuring 4.5 cm maximal AP diameter. No change since prior study. Scattered aortic calcification. Calcification of the coronary arteries. Mediastinum/Nodes: No enlarged mediastinal, hilar, or axillary lymph  nodes. Asymmetric nodular enlargement of the right thyroid gland with focal hypoenhancing nodule measuring 2.5 cm diameter. Trachea and esophagus demonstrate no significant findings. Lungs/Pleura: Mild dependent atelectasis in the lung bases. No focal consolidation. Airways are patent. No pleural effusions. No pneumothorax. Upper Abdomen: Nonobstructing stone in the right kidney. Central venous catheter demonstrated at the inferior vena caval atrial junction. Musculoskeletal: Circumscribed soft tissue lesion demonstrated in the right breast. This was not definitely present previously and could represent developing abscess, hematoma, or mass lesion. Elective mammographic correlation is recommended. Nodular soft tissue structure in the subcutaneous fat inferior to the right supraclavicular region measuring 12 mm diameter. On the previous study, there was a central venous catheter inserted a dislocation this likely represents postprocedural scarring or fluid. Review of the MIP images confirms the above findings. IMPRESSION: 1. No evidence of significant pulmonary embolus. 2. 4.5 cm diameter ascending thoracic aortic aneurysm. No change since previous study. Ascending thoracic aortic aneurysm. Recommend semi-annual imaging followup by CTA or MRA and referral to cardiothoracic surgery if not already obtained. This recommendation follows 2010 ACCF/AHA/AATS/ACR/ASA/SCA/SCAI/SIR/STS/SVM Guidelines for the Diagnosis and Management of Patients With Thoracic Aortic Disease. Circulation. 2010; 121: e266-e369 3. 2.5 cm diameter right thyroid nodule. Ultrasound recommended for further evaluation. 4. Indeterminate 3.5 cm diameter right breast lesion. Mammographic follow-up recommended. 5. No evidence of active pulmonary disease. Electronically Signed   By: Lucienne Capers M.D.   On: 01/15/2016 22:50   Mr Brain Wo Contrast  Result Date: 01/01/2016 CLINICAL DATA:  Possible seizure. Fall, hitting the back of the head. History  of end-stage renal disease on dialysis. EXAM: MRI HEAD WITHOUT CONTRAST TECHNIQUE: Multiplanar, multiecho pulse sequences of the brain and surrounding structures were obtained without intravenous contrast. COMPARISON:  Head CTs 12/31/2015 and earlier FINDINGS: Brain: The study is mildly motion degraded. The mesial temporal lobe structures are normal in appearance within this limitation. There is no evidence of acute infarct, intracranial hemorrhage, midline shift, or extra-axial fluid collection. The ventricles and sulci are normal in size for age. Periventricular and scattered subcortical white matter T2 hyperintensities are nonspecific but compatible with minimal chronic small vessel ischemic disease. Intermediate to low T1 and T2 signal mass along the undersurface of the posterior tentorium in/just right of midline measures approximately 1.7 x 1.0 x 1.5 cm, partially calcified on CT and at most minimally increased in size from a 10/04/2013 CT. There is no associated cerebellar edema. Vascular: Major intracranial vascular flow voids are preserved. Skull and upper cervical spine: Diffusely diminished bone marrow signal intensity which may relate to patient's chronic renal failure and anemia. Sinuses/Orbits: Minimal bilateral ethmoid and mild right sphenoid and right maxillary sinus mucosal thickening. Moderate bilateral mastoid effusions. Other: 1.2 cm subcutaneous nodule in the midline of the frontal scalp, benign in appearance and possibly a sebaceous cyst. IMPRESSION: 1. No acute intracranial abnormality or seizure focus identified. 2. 1.7 cm posterior fossa mass most consistent with meningioma. No cerebellar edema. 3. Minimal chronic small vessel ischemic disease. Electronically Signed   By: Logan Bores M.D.   On: 01/01/2016 12:09   Dg Foot Complete Right  Result Date: 12/31/2015 CLINICAL DATA:  Status post fall.  Unable to bear weight. EXAM: RIGHT FOOT COMPLETE - 3+ VIEW COMPARISON:  None. FINDINGS:  Moderate to severe osteopenia. No evidence of displaced fractures seen radiographically. Mild soft tissue swelling. IMPRESSION: No evidence of displaced fractures seen radiographically. However fractures could be easily overlooked in the settings of osteopenia, and therefore if the patient continues to be symptomatic CT of the foot may be considered. Electronically Signed   By: Fidela Salisbury M.D.   On: 12/31/2015 17:14      Assessment/Plan 1. ESRD with non-functional right femoral permcath. We'll place a temporary dialysis catheter which will serve as both IV access and her dialysis access until a new PermCath be placed after surgery. If her left jugular vein remains patent several days after her mastectomy, left jugular PermCath would be placed. If her left jugular vein is occluded, replacement of her right femoral PermCath would likely be performed. She voices her understanding and agrees with temporary dialysis catheter today. 2. Breast cancer. Mastectomy tomorrow. Dialysis access plans contingent on surgery going well and hopefully her left jugular will be able to have a PermCath placed after surgery. 3. Hypertension. Stable and outpatient medications. Monitor in the perioperative period.      Leotis Pain, MD  01/22/2016 4:58 PM

## 2016-01-22 NOTE — Op Note (Signed)
  OPERATIVE NOTE   PROCEDURE: 1. Ultrasound guidance for vascular access left femoral vein 2. Placement of a 30 cm triple lumen dialysis catheter left femoral vein  PRE-OPERATIVE DIAGNOSIS: 1. ESRD 2. 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 Breast cancer with left mastectomy planned for tomorrow  POST-OPERATIVE DIAGNOSIS: Same  SURGEON: Leotis Pain, MD  ASSISTANT(S): None  ANESTHESIA: local  ESTIMATED BLOOD LOSS: Minimal   FINDING(S): 1. None  SPECIMEN(S): None  INDICATIONS:  Patient is a 61 y.o.female who presents with end-stage renal disease with a nonfunctional right femoral PermCath. She will need a temporary dialysis catheter for dialysis and her perioperative period for her mastectomy as well as IV access which has been difficult to obtain.  Risks and benefits were discussed, and informed consent was obtained.  DESCRIPTION: After obtaining full informed written consent, the patient was laid flat in the bed. The left groin was sterilely prepped and draped in a sterile surgical field was created. The left femoral vein was visualized with ultrasound and found to be widely patent. It was then accessed under direct guidance without difficulty with a Seldinger needle and a permanent image was recorded. A J-wire was then placed. After skin nick and dilatation, a 30 cm triple-lumen dialysis catheter was placed over the wire and the wire was removed. The lumens withdrew dark red nonpulsatile blood and flushed easily with sterile saline. The catheter was secured to the skin with 3 nylon sutures. Sterile dressing was placed.  COMPLICATIONS: None  CONDITION: Stable  Leotis Pain 01/22/2016 5:03 PM  This note was created with Dragon Medical transcription system. Any errors in dictation are purely unintentional.

## 2016-01-22 NOTE — Progress Notes (Signed)
Post HD  

## 2016-01-22 NOTE — Progress Notes (Signed)
I was called By the internal medicine hospitalist physician with regard to this patient. She was planned for a mastectomy and lymph node dissection tomorrow on her left breast cancer. The diagnosis was originally made in July and she underwent neoadjuvant therapy.,   Her dialysis access catheter clotted off this morning prior to her completing dialysis. There was some confusion about her surgical schedule as dialysis did not appear to be possible. However, she was readmitted to the hospital had a dialysis catheter placed and is having dialysis tonight with plan of proceeding with surgery tomorrow S originally scheduled.  I talk with the patient and her family at length. Understandably they have some concerns about the issues that have developed. However she has been cleared by cardiology and with dialysis this evening I think she is as good a candidate for surgery as we can make her at this time. We will have our surgical team meeting with her tomorrow morning to make the final decision for surgical intervention but at this point tonight we'll plan surgery tomorrow. They are in agreement.

## 2016-01-22 NOTE — Progress Notes (Signed)
Routed to Belva @ [facility] @ [id number].

## 2016-01-22 NOTE — H&P (Signed)
[facility] NAME: Teresa Cook    MR#:  [id number]  DATE OF BIRTH:  [birth year]  DATE OF ADMISSION:  01/22/2016  PRIMARY CARE PHYSICIAN: Murlean Iba, MD   REQUESTING/REFERRING PHYSICIAN:  Murlean Iba MD  CHIEF COMPLAINT:  No chief complaint on file.   HISTORY OF PRESENT ILLNESS: Teresa Cook  is a 61 y.o. female with a known history of  breast cancer and end-stage renal disease who is referred by her primary nephrologist due to patient unable to get dialyzed due to her dialysis access getting clotted off. Patient has a mastectomy surgery planned for tomorrow. And will need surgery prior to that. Patient otherwise denies any fevers chills no chest pain shortness of breath no nausea vomiting or diarrhea. PAST MEDICAL HISTORY:   Past Medical History:  Diagnosis Date  . Anemia   . Anxiety   . Breast cancer ([place])   . CHF (congestive heart failure) ([place])   . Chronic kidney disease   . Depression   . Dialysis patient ([place])   . DVT (deep venous thrombosis) ([facility])    left leg  . DVT (deep venous thrombosis) ([place]) 1985   right thigh  . Gout   . Headache   . HTN (hypertension)   . Hypertension   . Parathyroid abnormality ([place])   . Parathyroid disease ([place])   . Pneumonia 12/2015  . Psoriasis   . Renal insufficiency   . Sickle cell trait ([place])    traits    PAST SURGICAL HISTORY: Past Surgical History:  Procedure Laterality Date  . ABDOMINAL HYSTERECTOMY    . APPENDECTOMY    . BREAST BIOPSY Left 10/28/2013   benign  . BREAST EXCISIONAL BIOPSY Left 2002   benign  . INSERTION OF DIALYSIS CATHETER  2014  . PARTIAL HYSTERECTOMY    . PERIPHERAL VASCULAR CATHETERIZATION N/A 12/25/2015   Procedure: Dialysis/Perma Catheter Insertion;  Surgeon: Algernon Huxley, MD;  Location: [facility];  Service: Cardiovascular;  Laterality: N/A;  . PORT-A-CATH REMOVAL N/A 12/20/2015   Procedure: REMOVAL PORT-A-CATH;  Surgeon: Hubbard Robinson, MD;  Location: [facility];  Service: General;  Laterality: N/A;  left    . PORTACATH PLACEMENT Left 08/21/2015   Procedure: INSERTION PORT-A-CATH;  Surgeon: Hubbard Robinson, MD;  Location: [facility];  Service: General;  Laterality: Left;  . REMOVAL OF A DIALYSIS CATHETER  2017    SOCIAL HISTORY:  Social History  Substance Use Topics  . Smoking status: Never Smoker  . Smokeless tobacco: Never Used  . Alcohol use No    FAMILY HISTORY:  Family History  Problem Relation Age of Onset  . Stroke Mother   . CVA Mother   . Hypertension Mother   . Hypertension Father   . Hypertension Sister   . Diabetes Sister   . Cancer Sister 24    Breast  . Stroke Brother   . Hypertension Brother   . Breast cancer Maternal Aunt 70    DRUG ALLERGIES:  Allergies  Allergen Reactions  . Gabapentin Other (See Comments)    Seizure  . Adhesive [Tape] Itching    Silk tape is ok to use.    REVIEW OF SYSTEMS:   CONSTITUTIONAL: No fever, fatigue or weakness.  EYES: No blurred or double vision.  EARS, NOSE, AND THROAT: No tinnitus or ear pain.  RESPIRATORY: No cough, shortness of breath, wheezing or hemoptysis.  CARDIOVASCULAR: No chest pain, orthopnea, edema.  GASTROINTESTINAL: No  nausea, vomiting, diarrhea or abdominal pain.  GENITOURINARY: No dysuria, hematuria.  ENDOCRINE: No polyuria, nocturia,  HEMATOLOGY: No anemia, easy bruising or bleeding SKIN: No rash or lesion. MUSCULOSKELETAL: No joint pain or arthritis.   NEUROLOGIC: No tingling, numbness, weakness.  PSYCHIATRY: No anxiety or depression.   MEDICATIONS AT HOME:  Prior to Admission medications   Medication Sig Start Date End Date Taking? Authorizing Provider  acetaminophen (TYLENOL) 325 MG tablet Take 2 tablets (650 mg total) by mouth every 6 (six) hours as needed for mild pain (or Fever >/= 101). 12/27/15   [name], MD  ALPRAZolam Duanne Moron) 0.5 MG tablet Take 1 tablet (0.5 mg total) by mouth 2 (two) times daily as  needed for anxiety. 12/27/15   [name], MD  benzonatate (TESSALON) 200 MG capsule Take 200 mg by mouth 3 (three) times daily as needed for cough.    Historical Provider, MD  feeding supplement, ENSURE ENLIVE, (ENSURE ENLIVE) LIQD Take 237 mLs by mouth 3 (three) times daily between meals. 12/27/15   [name], MD  levETIRAcetam (KEPPRA) 500 MG tablet Take 1 tablet (500 mg total) by mouth 2 (two) times daily. 01/03/16   Max Sane, MD  losartan (COZAAR) 50 MG tablet Take 100 mg by mouth See admin instructions. Takes 100 MG DAILY on non-dialysis days=Tuesday, Thursday, Saturday and Sunday. 09/07/14   Historical Provider, MD  metoprolol (LOPRESSOR) 100 MG tablet Take 0.5 tablets (50 mg total) by mouth 2 (two) times daily. Takes on non-dialysis days=Tuesday, Thursday, Saturday and Sunday. Patient taking differently: Take 100 mg by mouth See admin instructions. Takes 100 MG DAILY on non-dialysis days=Tuesday, Thursday, Saturday and Sunday. 12/27/15   [name], MD  pantoprazole (PROTONIX) 40 MG tablet Take 40 mg by mouth as needed.     Historical Provider, MD  sertraline (ZOLOFT) 100 MG tablet Take 100 mg by mouth daily.  03/05/12   Historical Provider, MD  traMADol (ULTRAM) 50 MG tablet Take 1 tablet (50 mg total) by mouth every 6 (six) hours as needed for moderate pain. 12/27/15   [name], MD  warfarin (COUMADIN) 5 MG tablet Take 1 tablet (5 MG )Friday, Saturday and Sundays. Take 1 1/2 tablets (7.5 MG) the other days of the week 11/03/13   Historical Provider, MD  zolpidem (AMBIEN) 10 MG tablet Take 10 mg by mouth at bedtime.     Historical Provider, MD      PHYSICAL EXAMINATION:   VITAL SIGNS: There were no vitals taken for this visit.  GENERAL:  61 y.o.-year-old patient lying in the bed with no acute distress.  EYES: Pupils equal, round, reactive to light and accommodation. No scleral icterus. Extraocular muscles intact.  HEENT: Head atraumatic, normocephalic. Oropharynx and nasopharynx  clear.  NECK:  Supple, no jugular venous distention. No thyroid enlargement, no tenderness.  LUNGS: Normal breath sounds bilaterally, no wheezing, rales,rhonchi or crepitation. No use of accessory muscles of respiration.  CARDIOVASCULAR: S1, S2 normal. No murmurs, rubs, or gallops.  ABDOMEN: Soft, nontender, nondistended. Bowel sounds present. No organomegaly or mass.  EXTREMITIES: No pedal edema, cyanosis, or clubbing.  NEUROLOGIC: Cranial nerves II through XII are intact. Muscle strength 5/5 in all extremities. Sensation intact. Gait not checked.  PSYCHIATRIC: The patient is alert and oriented x 3.  SKIN: No obvious rash, lesion, or ulcer.   LABORATORY PANEL:   CBC  Recent Labs Lab 01/15/16 2106  WBC 4.5  HGB 9.3*  HCT 27.7*  PLT 174  MCV 84.8  MCH 28.4  MCHC 33.5  RDW 17.0*   ------------------------------------------------------------------------------------------------------------------  Chemistries   Recent Labs Lab 01/15/16 2106  NA 141  K 3.5  CL 109  CO2 24  GLUCOSE 110*  BUN 14  CREATININE 3.84*  CALCIUM 9.8  AST 25  ALT 23  ALKPHOS 225*  BILITOT 0.5   ------------------------------------------------------------------------------------------------------------------ estimated creatinine clearance is 13.9 mL/min (by C-G formula based on SCr of 3.84 mg/dL (H)). ------------------------------------------------------------------------------------------------------------------ No results for input(s): TSH, T4TOTAL, T3FREE, THYROIDAB in the last 72 hours.  Invalid input(s): FREET3   Coagulation profile  Recent Labs Lab 01/15/16 2137  INR 2.64   ------------------------------------------------------------------------------------------------------------------- No results for input(s): DDIMER in the last 72 hours. -------------------------------------------------------------------------------------------------------------------  Cardiac  Enzymes  Recent Labs Lab 01/15/16 2106  TROPONINI 0.03*   ------------------------------------------------------------------------------------------------------------------ Invalid input(s): POCBNP  ---------------------------------------------------------------------------------------------------------------  Urinalysis    Component Value Date/Time   COLORURINE STRAW (A) 12/15/2015 2042   APPEARANCEUR CLEAR (A) 12/15/2015 2042   APPEARANCEUR Clear 09/08/2013 0133   LABSPEC 1.005 12/15/2015 2042   LABSPEC 1.013 09/08/2013 0133   PHURINE 9.0 (H) 12/15/2015 2042   GLUCOSEU 50 (A) 12/15/2015 2042   GLUCOSEU 50 mg/dL 09/08/2013 0133   HGBUR NEGATIVE 12/15/2015 2042   BILIRUBINUR NEGATIVE 12/15/2015 2042   BILIRUBINUR Negative 09/08/2013 0133   KETONESUR NEGATIVE 12/15/2015 2042   PROTEINUR 100 (A) 12/15/2015 2042   NITRITE NEGATIVE 12/15/2015 2042   LEUKOCYTESUR NEGATIVE 12/15/2015 2042   LEUKOCYTESUR Negative 09/08/2013 0133     RADIOLOGY: No results found.  EKG: Orders placed or performed in visit on 01/18/16  . EKG 12-Lead    IMPRESSION AND PLAN: Patient is a 61 year old who is scheduled for mastectomy today has missed her hemodialysis due to clotted dialysis access  1. End-stage renal disease patient will need a temporary dialysis catheter. I have discussed with Dr. Candiss Norse who referred the patient to me as well as Dr. Orpha Bur regarding this. They have communicated with the vascular surgeon to get the temporarily dialysis access today so patient could be dialyzed prior to surgery tomorrow.  2. Breast cancer with need of mastectomy tomorrow. I have discussed with Dr. Pat Patrick regarding patient being here. And if she is dialyzed today there should be no issues with her getting surgery as previously planned.  3. History of DVT Coumadin has been held for the past few days we'll check her INR recommend resumption of Coumadin postop  4. Essential hypertension continue therapy  with losartan and Lopressor  5. History of seizure disorder continue Keppra  6. Miscellaneous SCDs for DVT prophylaxis    All the records are reviewed and case discussed with ED provider. Management plans discussed with the patient, family and they are in agreement.  CODE STATUS:    Code Status Orders        Start     Ordered   01/22/16 1544  Full code  Continuous     01/22/16 1543    Code Status History    Date Active Date Inactive Code Status Order ID Comments User Context   12/31/2015  9:39 PM 01/03/2016  9:31 PM Full Code [id number]  [name], MD Inpatient   12/15/2015 10:40 PM 12/28/2015  6:52 PM Full Code [id number]  Harvie Bridge, DO Inpatient   08/31/2015  4:26 AM 09/02/2015  2:35 PM Full Code [id number]  Harrie Foreman, MD Inpatient   08/27/2015  4:06 AM 08/29/2015  3:57 PM Full Code [id number]  Saundra Shelling, MD ED   07/09/2015  2:44 PM 07/12/2015  7:55 PM  Full Code [id number]  Laverle Hobby, MD ED   09/19/2014 11:47 AM 09/20/2014  6:25 PM Full Code [id number]  Dustin Flock, MD ED   08/29/2014  2:28 PM 08/30/2014  5:43 PM Full Code [id number]  Fritzi Mandes, MD Inpatient       TOTAL TIME TAKING CARE OF THIS PATIENT: 64minutes.    Dustin Flock M.D on 01/22/2016 at 3:45 PM  Between 7am to 6pm - Pager - [phone]  After 6pm go to [email or web address] - password [email or web address] [facility]  Office  [phone]  CC: Primary care physician; Murlean Iba, MD

## 2016-01-22 NOTE — Progress Notes (Signed)
HD tx started  

## 2016-01-22 NOTE — Telephone Encounter (Signed)
Cardiac Clearance has been obtained from Christell Faith, PA at this time. See note in chart.  Spoke with patient. She last took her Coumadin on 01/18/16 as she was told and has been getting her Heparin injections at dialysis per patient.  Spoke with Dr. Azalee Course and she is ok proceeding to surgery since this has all been obtained.   I explained to patient that she would need to be at the [facility] at 0845am in the morning and have NOTHING to eat after midnight tonight. She verbalizes understanding of this and will [facility] in am.

## 2016-01-22 NOTE — Progress Notes (Signed)
PRE HD   

## 2016-01-22 NOTE — Progress Notes (Signed)
HD tx ended 

## 2016-01-22 NOTE — Telephone Encounter (Signed)
Request for surgical clearance:  1. What type of surgery is being performed? Bilateral Mastectomy  2. When is this surgery scheduled? 01/23/16  3. Are there any medications that need to be held prior to surgery and how long?   4. Name of physician performing surgery? Dr. Azalee Course 5. [place]  / Museum/gallery conservator (nurse)   6. What is your office phone and fax number?  [phone]   Fax [phone] 7.  8.  9. Needs to know stat (by 3pm) or they will have to r/s her surgery.

## 2016-01-22 NOTE — Progress Notes (Signed)
PRE HD assessment 

## 2016-01-22 NOTE — Progress Notes (Signed)
[facility]  ROUNDING NOTE   Subjective:   Patient's last dialysis was Friday. Patient's CVC was clotted and unable to get hemodialysis today. She was admitted to [facility] since she is scheduled for mastectomy tomorrow.   Admitted to [facility] and scheduled for temp HD catheter.   Objective:  Vital signs in last 24 hours:     Weight change:  There were no vitals filed for this visit.  Intake/Output: No intake/output data recorded.   Intake/Output this shift:  No intake/output data recorded.  Physical Exam: General: NAD,   Head: Normocephalic, atraumatic. Moist oral mucosal membranes  Eyes: Anicteric, PERRL  Neck: Supple, trachea midline  Lungs:  Clear to auscultation  Heart: Regular rate and rhythm  Abdomen:  Soft, nontender,   Extremities:   peripheral edema.  Neurologic: Nonfocal, moving all four extremities  Skin: No lesions  Access: Right femoral catheter    Basic Metabolic Panel:  Recent Labs Lab 01/15/16 2106  NA 141  K 3.5  CL 109  CO2 24  GLUCOSE 110*  BUN 14  CREATININE 3.84*  CALCIUM 9.8    Liver Function Tests:  Recent Labs Lab 01/15/16 2106  AST 25  ALT 23  ALKPHOS 225*  BILITOT 0.5  PROT 6.7  ALBUMIN 3.2*    Recent Labs Lab 01/15/16 2106  LIPASE 29   No results for input(s): AMMONIA in the last 168 hours.  CBC:  Recent Labs Lab 01/15/16 2106  WBC 4.5  HGB 9.3*  HCT 27.7*  MCV 84.8  PLT 174    Cardiac Enzymes:  Recent Labs Lab 01/15/16 2106  TROPONINI 0.03*    BNP: Invalid input(s): POCBNP  CBG: No results for input(s): GLUCAP in the last 168 hours.  Microbiology: Results for orders placed or performed during the hospital encounter of 01/15/16  Rapid Influenza A&B Antigens ([place] only)     Status: None   Collection Time: 01/15/16  9:37 PM  Result Value Ref Range Status   Influenza A ([place]) NEGATIVE NEGATIVE Final   Influenza B ([facility]) NEGATIVE NEGATIVE Final    Coagulation Studies: No results for  input(s): LABPROT, INR in the last 72 hours.  Urinalysis: No results for input(s): COLORURINE, LABSPEC, PHURINE, GLUCOSEU, HGBUR, BILIRUBINUR, KETONESUR, PROTEINUR, UROBILINOGEN, NITRITE, LEUKOCYTESUR in the last 72 hours.  Invalid input(s): APPERANCEUR    Imaging: No results found.   Medications:    . feeding supplement (ENSURE ENLIVE)  237 mL Oral TID BM  . levETIRAcetam  500 mg Oral BID  . losartan  100 mg Oral See admin instructions  . metoprolol  50 mg Oral BID  . pantoprazole  40 mg Oral Daily  . sertraline  100 mg Oral Daily  . sodium chloride flush  3 mL Intravenous Q12H  . zolpidem  10 mg Oral QHS   sodium chloride, acetaminophen **OR** acetaminophen, acetaminophen, ALPRAZolam, benzonatate, HYDROcodone-acetaminophen, ondansetron **OR** ondansetron (ZOFRAN) IV, sodium chloride flush, traMADol  Assessment/ Plan:  [name] is a 61 y.o. black female  with ESRD on hemodialysis, breast cancer , CHF, depression, DVT, hypertension and seizures.   [place]. / MWF  1. End Stage Renal Disease: with complication of dialysis device. Last dialysis Friday, 1/12.  - Check basic metabolic panel - Consult vascular surgery for temp HD catheter - Dialysis later today. Orders prepared.   2. Breast Cancer: mastectomy scheduled for tomorrow.  - currently holding warfarin.   3. Hypertension: elevated  - home regimen of furosemide, metoprolol and losartan.  4. Anemia of chronic kidney disease: hemoglobin 9.2 on 01/15/16. Holding epo due to malignacy  5. Secondary Hyperparathyroidism: with hyperphosphatemia - sevelamer with meals - cinacalcet     LOS: 0 Teresa Cook 1/15/20183:49 PM

## 2016-01-23 ENCOUNTER — Encounter: Payer: Self-pay | Admitting: *Deleted

## 2016-01-23 ENCOUNTER — Inpatient Hospital Stay: Admission: RE | Admit: 2016-01-23 | Payer: Medicare Other | Source: Ambulatory Visit | Admitting: Surgery

## 2016-01-23 ENCOUNTER — Inpatient Hospital Stay: Payer: Medicare Other

## 2016-01-23 ENCOUNTER — Encounter
Admission: AD | Disposition: A | Discharge status: Home Health | Payer: Self-pay | Source: Ambulatory Visit | Attending: Surgery

## 2016-01-23 ENCOUNTER — Encounter: Admission: RE | Payer: Self-pay | Source: Ambulatory Visit

## 2016-01-23 ENCOUNTER — Ambulatory Visit: Admission: RE | Admit: 2016-01-23 | Payer: Medicare Other | Source: Ambulatory Visit | Admitting: Surgery

## 2016-01-23 ENCOUNTER — Ambulatory Visit: Payer: Medicare Other

## 2016-01-23 ENCOUNTER — Encounter: Payer: Self-pay | Admitting: Anesthesiology

## 2016-01-23 ENCOUNTER — Inpatient Hospital Stay: Admission: RE | Admit: 2016-01-23 | Payer: Medicare Other | Source: Ambulatory Visit

## 2016-01-23 DIAGNOSIS — C50011 Malignant neoplasm of nipple and areola, right female breast: Secondary | ICD-10-CM

## 2016-01-23 DIAGNOSIS — C50012 Malignant neoplasm of nipple and areola, left female breast: Secondary | ICD-10-CM

## 2016-01-23 DIAGNOSIS — C50919 Malignant neoplasm of unspecified site of unspecified female breast: Secondary | ICD-10-CM | POA: Diagnosis present

## 2016-01-23 DIAGNOSIS — L723 Sebaceous cyst: Secondary | ICD-10-CM

## 2016-01-23 HISTORY — PX: LIPOMA EXCISION: SHX5283

## 2016-01-23 HISTORY — PX: MASTECTOMY W/ SENTINEL NODE BIOPSY: SHX2001

## 2016-01-23 LAB — CBC
HCT: 25.9 % — ABNORMAL LOW (ref 35.0–47.0)
Hemoglobin: 9 g/dL — ABNORMAL LOW (ref 12.0–16.0)
MCH: 29.7 pg (ref 26.0–34.0)
MCHC: 34.7 g/dL (ref 32.0–36.0)
MCV: 85.7 fL (ref 80.0–100.0)
Platelets: 139 10*3/uL — ABNORMAL LOW (ref 150–440)
RBC: 3.03 MIL/uL — ABNORMAL LOW (ref 3.80–5.20)
RDW: 17 % — ABNORMAL HIGH (ref 11.5–14.5)
WBC: 4.9 10*3/uL (ref 3.6–11.0)

## 2016-01-23 LAB — BASIC METABOLIC PANEL
ANION GAP: 7 (ref 5–15)
BUN: 17 mg/dL (ref 6–20)
CHLORIDE: 103 mmol/L (ref 101–111)
CO2: 29 mmol/L (ref 22–32)
Calcium: 9.3 mg/dL (ref 8.9–10.3)
Creatinine, Ser: 4.23 mg/dL — ABNORMAL HIGH (ref 0.44–1.00)
GFR, EST AFRICAN AMERICAN: 12 mL/min — AB (ref >60)
GFR, EST NON AFRICAN AMERICAN: 10 mL/min — AB (ref >60)
Glucose, Bld: 85 mg/dL (ref 65–99)
POTASSIUM: 3.7 mmol/L (ref 3.5–5.1)
SODIUM: 139 mmol/L (ref 135–145)

## 2016-01-23 LAB — HEPATITIS B SURFACE ANTIGEN: Hepatitis B Surface Ag: NEGATIVE

## 2016-01-23 LAB — HEPATITIS B SURFACE ANTIBODY,QUALITATIVE: HEP B S AB: REACTIVE

## 2016-01-23 LAB — HEPATITIS B CORE ANTIBODY, TOTAL: HEP B C TOTAL AB: NEGATIVE

## 2016-01-23 SURGERY — MASTECTOMY WITH SENTINEL LYMPH NODE BIOPSY
Anesthesia: General

## 2016-01-23 SURGERY — MASTECTOMY WITH SENTINEL LYMPH NODE BIOPSY
Anesthesia: Choice

## 2016-01-23 MED ORDER — SODIUM CHLORIDE 0.9 % IV SOLN
INTRAVENOUS | Status: DC
  Administered 2016-01-23: 50 mL/h via INTRAVENOUS

## 2016-01-23 MED ORDER — ONDANSETRON HCL 4 MG/2ML IJ SOLN: 4.0000 mg | Freq: Once | INTRAMUSCULAR | Status: DC | PRN

## 2016-01-23 MED ORDER — ACETAMINOPHEN 325 MG PO TABS
650.0000 mg | ORAL_TABLET | Freq: Four times a day (QID) | ORAL | Status: DC
  Administered 2016-01-23 – 2016-01-29 (×17): 650 mg via ORAL
  Filled 2016-01-23 (×16): qty 2

## 2016-01-23 MED ORDER — ONDANSETRON HCL 4 MG/2ML IJ SOLN: 4.0000 mg | Freq: Four times a day (QID) | INTRAMUSCULAR | Status: DC | PRN

## 2016-01-23 MED ORDER — ONDANSETRON HCL 4 MG/2ML IJ SOLN
INTRAMUSCULAR | Status: DC | PRN
  Administered 2016-01-23: 4 mg via INTRAVENOUS

## 2016-01-23 MED ORDER — SUGAMMADEX SODIUM 200 MG/2ML IV SOLN
INTRAVENOUS | Status: DC | PRN
  Administered 2016-01-23: 120 mg via INTRAVENOUS

## 2016-01-23 MED ORDER — EPHEDRINE SULFATE 50 MG/ML IJ SOLN
INTRAMUSCULAR | Status: DC | PRN
  Administered 2016-01-23: 5 mg via INTRAVENOUS
  Administered 2016-01-23 (×2): 15 mg via INTRAVENOUS
  Administered 2016-01-23: 20 mg via INTRAVENOUS
  Administered 2016-01-23: 10 mg via INTRAVENOUS

## 2016-01-23 MED ORDER — FENTANYL CITRATE (PF) 100 MCG/2ML IJ SOLN
25.0000 ug | INTRAMUSCULAR | Status: DC | PRN
  Administered 2016-01-23 (×2): 25 ug via INTRAVENOUS

## 2016-01-23 MED ORDER — CHLORHEXIDINE GLUCONATE CLOTH 2 % EX PADS: 6.0000 | MEDICATED_PAD | Freq: Once | CUTANEOUS | Status: DC

## 2016-01-23 MED ORDER — DEXAMETHASONE SODIUM PHOSPHATE 10 MG/ML IJ SOLN
INTRAMUSCULAR | Status: DC | PRN
  Administered 2016-01-23: 10 mg via INTRAVENOUS

## 2016-01-23 MED ORDER — CEFAZOLIN SODIUM-DEXTROSE 2-4 GM/100ML-% IV SOLN
INTRAVENOUS | Status: AC
  Administered 2016-01-23: 2 g via INTRAVENOUS
  Filled 2016-01-23: qty 100

## 2016-01-23 MED ORDER — CHLORHEXIDINE GLUCONATE CLOTH 2 % EX PADS
6.0000 | MEDICATED_PAD | Freq: Once | CUTANEOUS | Status: AC
  Administered 2016-01-23: 6 via TOPICAL

## 2016-01-23 MED ORDER — LIDOCAINE HCL (CARDIAC) 20 MG/ML IV SOLN
INTRAVENOUS | Status: DC | PRN
  Administered 2016-01-23: 80 mg via INTRAVENOUS

## 2016-01-23 MED ORDER — KETOROLAC TROMETHAMINE 15 MG/ML IJ SOLN
15.0000 mg | Freq: Four times a day (QID) | INTRAMUSCULAR | Status: DC
  Administered 2016-01-23 – 2016-01-24 (×4): 15 mg via INTRAVENOUS
  Filled 2016-01-23 (×4): qty 1

## 2016-01-23 MED ORDER — OXYCODONE HCL 5 MG PO TABS
10.0000 mg | ORAL_TABLET | ORAL | Status: DC | PRN
  Administered 2016-01-24 – 2016-01-28 (×8): 10 mg via ORAL
  Filled 2016-01-23 (×8): qty 2

## 2016-01-23 MED ORDER — FENTANYL CITRATE (PF) 100 MCG/2ML IJ SOLN
INTRAMUSCULAR | Status: AC
  Administered 2016-01-23: 25 ug via INTRAVENOUS
  Filled 2016-01-23: qty 2

## 2016-01-23 MED ORDER — LIDOCAINE HCL (PF) 1 % IJ SOLN
INTRAMUSCULAR | Status: AC
  Filled 2016-01-23: qty 30

## 2016-01-23 MED ORDER — PROMETHAZINE HCL 25 MG/ML IJ SOLN
INTRAMUSCULAR | Status: AC
  Administered 2016-01-23: 6.25 mg via INTRAVENOUS
  Filled 2016-01-23: qty 1

## 2016-01-23 MED ORDER — PROPOFOL 10 MG/ML IV BOLUS
INTRAVENOUS | Status: DC | PRN
  Administered 2016-01-23: 120 mg via INTRAVENOUS

## 2016-01-23 MED ORDER — CYCLOBENZAPRINE HCL 10 MG PO TABS
10.0000 mg | ORAL_TABLET | Freq: Three times a day (TID) | ORAL | Status: DC
  Administered 2016-01-23 – 2016-01-28 (×13): 10 mg via ORAL
  Filled 2016-01-23 (×13): qty 1

## 2016-01-23 MED ORDER — SODIUM CHLORIDE FLUSH 0.9 % IV SOLN
INTRAVENOUS | Status: AC
  Filled 2016-01-23: qty 10

## 2016-01-23 MED ORDER — LABETALOL HCL 5 MG/ML IV SOLN
INTRAVENOUS | Status: DC | PRN
  Administered 2016-01-23: 5 mg via INTRAVENOUS

## 2016-01-23 MED ORDER — MIDAZOLAM HCL 2 MG/2ML IJ SOLN
INTRAMUSCULAR | Status: DC | PRN
  Administered 2016-01-23: 1 mg via INTRAVENOUS

## 2016-01-23 MED ORDER — HYDROMORPHONE HCL 1 MG/ML IJ SOLN: 0.5000 mg | INTRAMUSCULAR | Status: DC | PRN

## 2016-01-23 MED ORDER — HYDROMORPHONE HCL 1 MG/ML IJ SOLN
INTRAMUSCULAR | Status: DC | PRN
  Administered 2016-01-23 (×2): 0.5 mg via INTRAVENOUS

## 2016-01-23 MED ORDER — ONDANSETRON 4 MG PO TBDP
4.0000 mg | ORAL_TABLET | Freq: Four times a day (QID) | ORAL | Status: DC | PRN
Start: 2016-01-23 — End: 2016-01-29

## 2016-01-23 MED ORDER — PROMETHAZINE HCL 25 MG/ML IJ SOLN
6.2500 mg | INTRAMUSCULAR | Status: DC | PRN
  Administered 2016-01-23: 6.25 mg via INTRAVENOUS

## 2016-01-23 MED ORDER — PHENYLEPHRINE HCL 10 MG/ML IJ SOLN
INTRAMUSCULAR | Status: DC | PRN
  Administered 2016-01-23: 80 ug via INTRAVENOUS
  Administered 2016-01-23: 100 ug via INTRAVENOUS
  Administered 2016-01-23 (×3): 80 ug via INTRAVENOUS
  Administered 2016-01-23: 120 ug via INTRAVENOUS
  Administered 2016-01-23 (×2): 80 ug via INTRAVENOUS

## 2016-01-23 MED ORDER — BUPIVACAINE HCL (PF) 0.5 % IJ SOLN
INTRAMUSCULAR | Status: AC
  Filled 2016-01-23: qty 30

## 2016-01-23 MED ORDER — SODIUM CHLORIDE 0.9 % IJ SOLN
INTRAMUSCULAR | Status: AC
  Filled 2016-01-23: qty 10

## 2016-01-23 MED ORDER — TECHNETIUM TC 99M SULFUR COLLOID
1.0100 | Freq: Once | INTRAVENOUS | Status: AC | PRN
  Administered 2016-01-23: 1.01 via INTRAVENOUS

## 2016-01-23 MED ORDER — SODIUM CHLORIDE 0.9 % IV SOLN
INTRAVENOUS | Status: DC
  Administered 2016-01-23: 20:00:00 via INTRAVENOUS

## 2016-01-23 MED ORDER — LACTATED RINGERS IV SOLN: INTRAVENOUS | Status: DC | PRN

## 2016-01-23 MED ORDER — CEFAZOLIN SODIUM-DEXTROSE 2-4 GM/100ML-% IV SOLN
2.0000 g | INTRAVENOUS | Status: AC
  Administered 2016-01-23: 2 g via INTRAVENOUS

## 2016-01-23 MED ORDER — FENTANYL CITRATE (PF) 100 MCG/2ML IJ SOLN
INTRAMUSCULAR | Status: DC | PRN
  Administered 2016-01-23: 100 ug via INTRAVENOUS
  Administered 2016-01-23 (×2): 50 ug via INTRAVENOUS
  Administered 2016-01-23: 25 ug via INTRAVENOUS
  Administered 2016-01-23: 50 ug via INTRAVENOUS
  Administered 2016-01-23: 25 ug via INTRAVENOUS
  Administered 2016-01-23 (×2): 50 ug via INTRAVENOUS

## 2016-01-23 MED ORDER — ROCURONIUM BROMIDE 100 MG/10ML IV SOLN
INTRAVENOUS | Status: DC | PRN
  Administered 2016-01-23: 50 mg via INTRAVENOUS
  Administered 2016-01-23: 30 mg via INTRAVENOUS

## 2016-01-23 MED ORDER — TECHNETIUM TC 99M SULFUR COLLOID
1.1300 | Freq: Once | INTRAVENOUS | Status: AC | PRN
  Administered 2016-01-23: 1.13 via INTRAVENOUS

## 2016-01-23 SURGICAL SUPPLY — 55 items
BLADE SURG 15 STRL LF DISP TIS (BLADE) ×2 IMPLANT
BLADE SURG 15 STRL SS (BLADE) ×1
BULB RESERV EVAC DRAIN JP 100C (MISCELLANEOUS) ×12 IMPLANT
CANISTER SUCT 1200ML W/VALVE (MISCELLANEOUS) ×3 IMPLANT
CHLORAPREP W/TINT 26ML (MISCELLANEOUS) ×3 IMPLANT
CNTNR SPEC 2.5X3XGRAD LEK (MISCELLANEOUS) ×16
CONT SPEC 4OZ STER OR WHT (MISCELLANEOUS) ×8
CONTAINER SPEC 2.5X3XGRAD LEK (MISCELLANEOUS) ×16 IMPLANT
DERMABOND ADVANCED (GAUZE/BANDAGES/DRESSINGS) ×3
DERMABOND ADVANCED .7 DNX12 (GAUZE/BANDAGES/DRESSINGS) ×6 IMPLANT
DRAIN CHANNEL JP 19F (MISCELLANEOUS) ×12 IMPLANT
DRAPE LAPAROTOMY 100X77 ABD (DRAPES) ×3 IMPLANT
DRAPE LAPAROTOMY TRNSV 106X77 (MISCELLANEOUS) ×3 IMPLANT
DRESSING TELFA 4X3 1S ST N-ADH (GAUZE/BANDAGES/DRESSINGS) IMPLANT
DRSG TEGADERM 4X4.75 (GAUZE/BANDAGES/DRESSINGS) IMPLANT
ELECT CAUTERY BLADE 6.4 (BLADE) ×3 IMPLANT
ELECT CAUTERY BLADE TIP 2.5 (TIP) ×3
ELECT REM PT RETURN 9FT ADLT (ELECTROSURGICAL) ×3
ELECTRODE CAUTERY BLDE TIP 2.5 (TIP) ×2 IMPLANT
ELECTRODE REM PT RTRN 9FT ADLT (ELECTROSURGICAL) ×2 IMPLANT
GAUZE FLUFF 18X24 1PLY STRL (GAUZE/BANDAGES/DRESSINGS) ×6 IMPLANT
GLOVE BIO SURGEON STRL SZ 6.5 (GLOVE) ×3 IMPLANT
GLOVE BIO SURGEON STRL SZ7.5 (GLOVE) ×3 IMPLANT
GLOVE PI ORTHOPRO 6.5 (GLOVE) ×2
GLOVE PI ORTHOPRO STRL 6.5 (GLOVE) ×4 IMPLANT
GOWN STRL REUS W/ TWL LRG LVL3 (GOWN DISPOSABLE) ×10 IMPLANT
GOWN STRL REUS W/TWL LRG LVL3 (GOWN DISPOSABLE) ×5
KIT RM TURNOVER STRD PROC AR (KITS) ×3 IMPLANT
LABEL OR SOLS (LABEL) IMPLANT
LIQUID BAND (GAUZE/BANDAGES/DRESSINGS) IMPLANT
NDL SAFETY 25GX1.5 (NEEDLE) ×3 IMPLANT
NEEDLE FILTER BLUNT 18X 1/2SAF (NEEDLE) ×1
NEEDLE FILTER BLUNT 18X1 1/2 (NEEDLE) ×2 IMPLANT
NEEDLE HYPO 25X1 1.5 SAFETY (NEEDLE) ×3 IMPLANT
NS IRRIG 500ML POUR BTL (IV SOLUTION) ×3 IMPLANT
PACK BASIN MAJOR ARMC (MISCELLANEOUS) ×3 IMPLANT
PACK BASIN MINOR ARMC (MISCELLANEOUS) ×3 IMPLANT
SLEVE PROBE SENORX GAMMA FIND (MISCELLANEOUS) ×3 IMPLANT
SPONGE LAP 18X18 5 PK (GAUZE/BANDAGES/DRESSINGS) ×9 IMPLANT
STRIP CLOSURE SKIN 1/2X4 (GAUZE/BANDAGES/DRESSINGS) IMPLANT
SURGI-BRA LG (MISCELLANEOUS) ×3 IMPLANT
SUT ETHILON 3-0 FS-10 30 BLK (SUTURE) ×3
SUT MNCRL 3-0 UNDYED SH (SUTURE) IMPLANT
SUT MNCRL 4-0 (SUTURE) ×1
SUT MNCRL 4-0 27XMFL (SUTURE) ×2
SUT MON AB 3-0 SH 27 (SUTURE) ×12 IMPLANT
SUT MONOCRYL 3-0 UNDYED (SUTURE)
SUT VIC AB 3-0 SH 27 (SUTURE) ×1
SUT VIC AB 3-0 SH 27X BRD (SUTURE) ×2 IMPLANT
SUTURE EHLN 3-0 FS-10 30 BLK (SUTURE) ×2 IMPLANT
SUTURE MNCRL 4-0 27XMF (SUTURE) ×2 IMPLANT
SWABSTK COMLB BENZOIN TINCTURE (MISCELLANEOUS) ×3 IMPLANT
SYR BULB EAR ULCER 3OZ GRN STR (SYRINGE) IMPLANT
SYRINGE 10CC LL (SYRINGE) ×3 IMPLANT
WATER STERILE IRR 1000ML POUR (IV SOLUTION) ×9 IMPLANT

## 2016-01-23 NOTE — Anesthesia Preprocedure Evaluation (Signed)
Anesthesia Evaluation  Patient identified by MRN, date of birth, ID band Patient awake    Reviewed: Allergy & Precautions, H&P , NPO status , Patient's Chart, lab work & pertinent test results, reviewed documented beta blocker date and time   History of Anesthesia Complications Negative for: history of anesthetic complications  Airway Mallampati: III  TM Distance: >3 FB Neck ROM: full    Dental no notable dental hx. (+) Missing   Pulmonary neg pulmonary ROS,    Pulmonary exam normal breath sounds clear to auscultation       Cardiovascular Exercise Tolerance: Good hypertension, (-) angina+ Peripheral Vascular Disease and +CHF  (-) CAD, (-) Past MI, (-) Cardiac Stents and (-) CABG Normal cardiovascular exam(-) dysrhythmias (-) Valvular Problems/Murmurs Rhythm:regular Rate:Normal     Neuro/Psych Seizures -, Well Controlled,  PSYCHIATRIC DISORDERS (Depression)    GI/Hepatic negative GI ROS, Neg liver ROS,   Endo/Other  negative endocrine ROS  Renal/GU ESRF and DialysisRenal disease  negative genitourinary   Musculoskeletal   Abdominal   Peds  Hematology  (+) Blood dyscrasia, anemia ,   Anesthesia Other Findings Past Medical History: No date: Anemia No date: Anxiety No date: CHF (congestive heart failure) ([facility]) No date: Chronic kidney disease No date: Depression No date: Dialysis patient ([place]) No date: DVT (deep venous thrombosis) ([place])     Comment: left leg No date: DVT (deep venous thrombosis) ([facility]) No date: Gout No date: HTN (hypertension) No date: Hypertension No date: Parathyroid abnormality (H[facility]) No date: Parathyroid disease ([place]) No date: Psoriasis   Reproductive/Obstetrics negative OB ROS                             Anesthesia Physical  Anesthesia Plan  ASA: IV  Anesthesia Plan: General ETT   Post-op Pain Management:    Induction:   Airway Management Planned:    Additional Equipment:   Intra-op Plan:   Post-operative Plan:   Informed Consent: I have reviewed the patients History and Physical, chart, labs and discussed the procedure including the risks, benefits and alternatives for the proposed anesthesia with the patient or authorized representative who has indicated his/her understanding and acceptance.   Dental Advisory Given  Plan Discussed with: Anesthesiologist, CRNA and Surgeon  Anesthesia Plan Comments:         Anesthesia Quick Evaluation

## 2016-01-23 NOTE — Anesthesia Procedure Notes (Signed)
Procedure Name: Intubation Date/Time: 01/23/2016 11:22 AM Performed by: Justus Memory Pre-anesthesia Checklist: Patient identified, Emergency Drugs available, Suction available and Patient being monitored Patient Re-evaluated:Patient Re-evaluated prior to inductionPreoxygenation: Pre-oxygenation with 100% oxygen Intubation Type: IV induction Ventilation: Mask ventilation without difficulty Laryngoscope Size: Mac and 3 Grade View: Grade I Tube type: Oral Tube size: 7.0 mm Number of attempts: 1 Airway Equipment and Method: Stylet Placement Confirmation: ETT inserted through vocal cords under direct vision,  positive ETCO2 and breath sounds checked- equal and bilateral Secured at: 19 (teeth) cm Tube secured with: Tape Dental Injury: Teeth and Oropharynx as per pre-operative assessment

## 2016-01-23 NOTE — Care Management (Signed)
HD info faxed to [facility] HD liaison

## 2016-01-23 NOTE — Transfer of Care (Signed)
Immediate Anesthesia Transfer of Care Note  Patient: Teresa Cook  Procedure(s) Performed: Procedure(s): bilateral MASTECTOMY WITH  bilateral SENTINEL LYMPH NODE BIOPSY possible left axillary node dissection forehead lipoma removal (Bilateral) EXCISION LIPOMA (N/A)  Patient Location: PACU  Anesthesia Type:General  Level of Consciousness: awake and alert   Airway & Oxygen Therapy: Patient Spontanous Breathing and Patient connected to face mask oxygen  Post-op Assessment: Report given to RN and Post -op Vital signs reviewed and stable  Post vital signs: Reviewed and stable  Last Vitals:  Vitals:   01/23/16 0953 01/23/16 1658  BP: (!) 171/94 (!) 156/96  Pulse: 79 95  Resp: 16 10  Temp: 36.6 C 36.4 C    Last Pain:  Vitals:   01/23/16 0953  TempSrc: Tympanic  PainSc: 2       Patients Stated Pain Goal: 0 (29/19/16 6060)  Complications: No apparent anesthesia complications

## 2016-01-23 NOTE — Progress Notes (Signed)
Patient admitted for temporary dialysis access placement for hemodialysis as scheduled for bilateral mastectomy today- temp catheter placed by vascular and had dialysis yesterday I haven't been able to see the patient today as she has been in surgery all morning. Patient will be transferred to surgical service after surgery today Will still consult on her until she is medically stable Please call the on call hospitalist if any urgent matters overnight.

## 2016-01-23 NOTE — Anesthesia Post-op Follow-up Note (Cosign Needed)
Anesthesia QCDR form completed.        

## 2016-01-23 NOTE — Anesthesia Postprocedure Evaluation (Signed)
Anesthesia Post Note  Patient: Teresa Cook  Procedure(s) Performed: Procedure(s) (LRB): bilateral MASTECTOMY WITH  bilateral SENTINEL LYMPH NODE BIOPSY possible left axillary node dissection forehead lipoma removal (Bilateral) EXCISION LIPOMA (N/A)  Patient location during evaluation: PACU Anesthesia Type: General Level of consciousness: awake and alert and oriented Pain management: pain level controlled Vital Signs Assessment: post-procedure vital signs reviewed and stable Respiratory status: spontaneous breathing, nonlabored ventilation and respiratory function stable Cardiovascular status: blood pressure returned to baseline and stable Postop Assessment: no signs of nausea or vomiting Anesthetic complications: no     Last Vitals:  Vitals:   01/23/16 1743 01/23/16 1750  BP: (!) 158/99   Pulse: 89 79  Resp: 14 18  Temp:      Last Pain:  Vitals:   01/23/16 1750  TempSrc:   PainSc: 4                  Eiliana Drone

## 2016-01-23 NOTE — Interval H&P Note (Signed)
History and Physical Interval Note:  01/23/2016 8:27 AM   [name] is well known to me.  I have discussed the risks, benefits at length with her and her family of bilateral mastectomy with sentinel lymph node biopsies possible left axillary lymph node dissection and forehead lipoma excision.  I discussed the available options with the patient, daughter and husband. The risk of recurrence is similar between mastectomy and lumpectomy with radiation but given the size of the cancer in the left breast, I am not able to safely offer a lumpectomy.  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  Given that she has had a large cancer on the left and that it would need MRI to follow the right breast lesion every year, she has also opted to have a right mastectomy.   I also discussed that we would need to do a sentinel lymph node biopsy to check the nodes on the right and on the left will do lymph node biopsy and possible axillary dissection since she did have a biopsy positive lymph node.   I discussed risks of bleeding, infection, damage to surrounding tissues, having positive margins, needing further resection, damage to nerves causing arm numbness or difficulty raising arm, causing lymphoedema in the arm; as well as anesthesia risks of MI, stroke, prolonged ventilation, pulmonary embolism, thrombosis and even death. Patient was given the opportunity to ask questions and have them answered. She would like to proceed with Bilateral mastectomy with bilateral sentinel lymph node biopsy possible left axillary dissection and forehead lipoma excision.   Teresa Cook  has presented today for surgery, with the diagnosis of ESRD  The various methods of treatment have been discussed with the patient and family. After consideration of risks, benefits and other options for treatment, the patient has  consented to  Procedure(s): Dialysis/Perma Catheter Insertion (Left) as a surgical intervention .  The patient's history has been reviewed, patient examined, no change in status, stable for surgery.  I have reviewed the patient's chart and labs.  Questions were answered to the patient's satisfaction.     Ashante Snelling L Kennon Encinas

## 2016-01-23 NOTE — Brief Op Note (Signed)
01/22/2016 - 01/23/2016  5:27 PM  PATIENT:  Teresa Cook  61 y.o. female  PRE-OPERATIVE DIAGNOSIS:  malignant neoplasm of left of breast also lipoma of forehead  POST-OPERATIVE DIAGNOSIS:  same  PROCEDURE:  Procedure(s): bilateral MASTECTOMY WITH  bilateral SENTINEL LYMPH NODE BIOPSY possible left axillary node dissection forehead lipoma removal (Bilateral) EXCISION LIPOMA (N/A)  SURGEON:  Surgeon(s) and Role:    * Hubbard Robinson, MD - Primary    * Nestor Lewandowsky, MD - Assisting  PHYSICIAN ASSISTANT: none  ASSISTANTS: Dr. Marta Lamas   ANESTHESIA:   general  EBL:  Total I/O In: 800 [I.V.:800] Out: 250 [Blood:250]  BLOOD ADMINISTERED:none  DRAINS: (4) Jackson-Pratt drain(s) with closed bulb suction in the two in the right and two in the left chest   LOCAL MEDICATIONS USED:  NONE  SPECIMEN:  Source of Specimen:  Right breast, right axillary lymph nodes x2;  Left breast, left axillary lymph nodes 1-6  DISPOSITION OF SPECIMEN:  PATHOLOGY  COUNTS:  YES  TOURNIQUET:  * No tourniquets in log *  DICTATION: .Dragon Dictation  PLAN OF CARE: Admit to inpatient   PATIENT DISPOSITION:  PACU - hemodynamically stable.   Delay start of Pharmacological VTE agent (>24hrs) due to surgical blood loss or risk of bleeding: yes

## 2016-01-23 NOTE — H&P (View-Only) (Signed)
I was called By the internal medicine hospitalist physician with regard to this patient. She was planned for a mastectomy and lymph node dissection tomorrow on her left breast cancer. The diagnosis was originally made in July and she underwent neoadjuvant therapy.,   Her dialysis access catheter clotted off this morning prior to her completing dialysis. There was some confusion about her surgical schedule as dialysis did not appear to be possible. However, she was readmitted to the hospital had a dialysis catheter placed and is having dialysis tonight with plan of proceeding with surgery tomorrow S originally scheduled.  I talk with the patient and her family at length. Understandably they have some concerns about the issues that have developed. However she has been cleared by cardiology and with dialysis this evening I think she is as good a candidate for surgery as we can make her at this time. We will have our surgical team meeting with her tomorrow morning to make the final decision for surgical intervention but at this point tonight we'll plan surgery tomorrow. They are in agreement.

## 2016-01-24 ENCOUNTER — Encounter: Payer: Self-pay | Admitting: Surgery

## 2016-01-24 DIAGNOSIS — L989 Disorder of the skin and subcutaneous tissue, unspecified: Secondary | ICD-10-CM

## 2016-01-24 LAB — CBC
HCT: 23 % — ABNORMAL LOW (ref 35.0–47.0)
HEMATOCRIT: 21.3 % — AB (ref 35.0–47.0)
HEMOGLOBIN: 7.2 g/dL — AB (ref 12.0–16.0)
Hemoglobin: 7.6 g/dL — ABNORMAL LOW (ref 12.0–16.0)
MCH: 28.5 pg (ref 26.0–34.0)
MCH: 28.7 pg (ref 26.0–34.0)
MCHC: 33.3 g/dL (ref 32.0–36.0)
MCHC: 33.6 g/dL (ref 32.0–36.0)
MCV: 85.6 fL (ref 80.0–100.0)
MCV: 85.5 fL (ref 80.0–100.0)
PLATELETS: 142 10*3/uL — AB (ref 150–440)
Platelets: 140 10*3/uL — ABNORMAL LOW (ref 150–440)
RBC: 2.68 MIL/uL — AB (ref 3.80–5.20)
RBC: 2.49 MIL/uL — ABNORMAL LOW (ref 3.80–5.20)
RDW: 17 % — ABNORMAL HIGH (ref 11.5–14.5)
RDW: 16.9 % — AB (ref 11.5–14.5)
WBC: 7.6 10*3/uL (ref 3.6–11.0)
WBC: 6.8 10*3/uL (ref 3.6–11.0)

## 2016-01-24 LAB — BASIC METABOLIC PANEL
ANION GAP: 10 (ref 5–15)
BUN: 35 mg/dL — ABNORMAL HIGH (ref 6–20)
CALCIUM: 9.1 mg/dL (ref 8.9–10.3)
CO2: 25 mmol/L (ref 22–32)
Chloride: 102 mmol/L (ref 101–111)
Creatinine, Ser: 6.19 mg/dL — ABNORMAL HIGH (ref 0.44–1.00)
GFR, EST AFRICAN AMERICAN: 8 mL/min — AB (ref >60)
GFR, EST NON AFRICAN AMERICAN: 7 mL/min — AB (ref >60)
Glucose, Bld: 95 mg/dL (ref 65–99)
POTASSIUM: 5 mmol/L (ref 3.5–5.1)
SODIUM: 137 mmol/L (ref 135–145)

## 2016-01-24 LAB — RENAL FUNCTION PANEL
ANION GAP: 10 (ref 5–15)
Albumin: 2.6 g/dL — ABNORMAL LOW (ref 3.5–5.0)
BUN: 36 mg/dL — AB (ref 6–20)
CO2: 25 mmol/L (ref 22–32)
Calcium: 8.7 mg/dL — ABNORMAL LOW (ref 8.9–10.3)
Chloride: 102 mmol/L (ref 101–111)
Creatinine, Ser: 6.66 mg/dL — ABNORMAL HIGH (ref 0.44–1.00)
GFR calc Af Amer: 7 mL/min — ABNORMAL LOW (ref >60)
GFR calc non Af Amer: 6 mL/min — ABNORMAL LOW (ref >60)
GLUCOSE: 116 mg/dL — AB (ref 65–99)
POTASSIUM: 4.4 mmol/L (ref 3.5–5.1)
Phosphorus: 5.6 mg/dL — ABNORMAL HIGH (ref 2.5–4.6)
SODIUM: 137 mmol/L (ref 135–145)

## 2016-01-24 LAB — PREPARE RBC (CROSSMATCH)

## 2016-01-24 MED ORDER — SODIUM CHLORIDE 0.9 % IV SOLN
Freq: Once | INTRAVENOUS | Status: AC
  Administered 2016-01-24: 15:00:00 via INTRAVENOUS

## 2016-01-24 NOTE — Progress Notes (Signed)
Pre hd assessment  

## 2016-01-24 NOTE — Progress Notes (Signed)
[facility] NAME: Teresa Cook    MR#:  [id number]  DATE OF BIRTH:  03-Mar-1955  SUBJECTIVE:  CHIEF COMPLAINT:  No chief complaint on file.  - had bilateral mastectomy yesterday, has pain and weakness - dialysis today  - hb low after surgery- 1 unit prbc ordered  REVIEW OF SYSTEMS:  Review of Systems  Constitutional: Positive for malaise/fatigue. Negative for chills, fever and weight loss.  HENT: Negative for ear discharge, ear pain, hearing loss and nosebleeds.   Eyes: Negative for blurred vision, double vision and photophobia.  Respiratory: Negative for cough, hemoptysis, shortness of breath and wheezing.   Cardiovascular: Positive for chest pain. Negative for palpitations, orthopnea and leg swelling.  Gastrointestinal: Positive for nausea. Negative for abdominal pain, constipation, diarrhea, heartburn, melena and vomiting.  Genitourinary: Negative for dysuria.  Musculoskeletal: Positive for back pain and myalgias. Negative for neck pain.  Skin: Negative for rash.  Neurological: Negative for dizziness, speech change, focal weakness and headaches.  Endo/Heme/Allergies: Does not bruise/bleed easily.  Psychiatric/Behavioral: Negative for depression.    DRUG ALLERGIES:   Allergies  Allergen Reactions  . Gabapentin Other (See Comments)    Seizure  . Adhesive [Tape] Itching    Silk tape is ok to use.    VITALS:  Blood pressure 92/63, pulse 97, temperature 97.8 F (36.6 C), temperature source Oral, resp. rate 16, height 5\' 6"  (1.676 m), weight 59.3 kg (130 lb 11.7 oz), SpO2 100 %.  PHYSICAL EXAMINATION:  Physical Exam  GENERAL:  61 y.o.-year-old patient lying in the bed with no acute distress.  EYES: Pupils equal, round, reactive to light and accommodation. No scleral icterus. Extraocular muscles intact.  HEENT: Head atraumatic, normocephalic. Oropharynx and nasopharynx clear.  NECK:  Supple, no jugular venous  distention. No thyroid enlargement, no tenderness.  LUNGS and CHEST: Normal breath sounds bilaterally, no wheezing, rales,rhonchi or crepitation. No use of accessory muscles of respiration.  S/p bilateral mastectomy and dressing in place CARDIOVASCULAR: S1, S2 normal. No murmurs, rubs, or gallops.  ABDOMEN: Soft, nontender, nondistended. Bowel sounds present. No organomegaly or mass.  EXTREMITIES: No pedal edema, cyanosis, or clubbing. Has right groin permacath and left groin temp dialysis catheter NEUROLOGIC: Cranial nerves II through XII are intact. Muscle strength 5/5 in all extremities. Sensation intact. Gait not checked. Following commands PSYCHIATRIC: The patient is alert and oriented x 3.  SKIN: No obvious rash, lesion, or ulcer.    LABORATORY PANEL:   CBC  Recent Labs Lab 01/24/16 1015  WBC 6.8  HGB 7.2*  HCT 21.3*  PLT 140*   ------------------------------------------------------------------------------------------------------------------  Chemistries   Recent Labs Lab 01/24/16 1015  NA 137  K 4.4  CL 102  CO2 25  GLUCOSE 116*  BUN 36*  CREATININE 6.66*  CALCIUM 8.7*   ------------------------------------------------------------------------------------------------------------------  Cardiac Enzymes No results for input(s): TROPONINI in the last 168 hours. ------------------------------------------------------------------------------------------------------------------  RADIOLOGY:  Nm Sentinel Node Injection  Result Date: 01/23/2016 CLINICAL DATA:  Biopsy proven left breast cancer. Recent MRI showed abnormal enhancement in the right breast. Right biopsy performed 11/16/2015. EXAM: NUCLEAR MEDICINE BREAST LYMPHOSCINTIGRAPHY , BILATERAL TECHNIQUE: The procedure, risks (including but not limited to bleeding, infection, organ damage ), benefits, and alternatives were explained to the patient. Questions regarding the procedure were encouraged and answered. The  patient understands and consents to the procedure. Intradermal injection of radiopharmaceutical was performed at the 9 o'clock position of the RIGHT nipple. Intradermal injection of radiopharmaceutical was  performed at the 3 o'clock position of the LEFT nipple. The patient was then sent to the operating room for sentinel node(s) identification and removal by the surgeon. RADIOPHARMACEUTICALS:  Total of 1.01+ 1.13 mCi Millipore-filtered Technetium-36m sulfur colloid IMPRESSION: Uncomplicated bilateral intradermal injection of a Technetium-79m sulfur colloid for purposes of sentinel node identification. Electronically Signed   By: Lucrezia Europe M.D.   On: 01/23/2016 11:33    EKG:   Orders placed or performed in visit on 01/18/16  . EKG 12-Lead    ASSESSMENT AND PLAN:   61 year old female with new diagnosis of breast mass diagnosed to be malignant, end-stage renal disease on Monday Wednesday Friday hemodialysis, anemia of chronic disease, history of DVT on Coumadin, hypertension, hyperparathyroidism admitted for bilateral mastectomy but also noted to have her dialysis catheter clotted off.  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 -Management per surgical team. -Pain control recommended.  #2 end-stage renal disease on hemodialysis-the right groin permacath is clotted off, has a left groin temporary dialysis catheter placed. Appreciate nephrology and vascular surgery consults. -will need a permacath placement prior to discharge. - Dialysis done today.  #3 anemia of chronic disease-acute on chronic anemia after surgery. Cannot give Epogen due to her diagnosis of cancer. -1 unit packed RBC transfusion ordered today.  #4 history of DVT-due to recent surgery and anemia, continue to hold Coumadin at this time.  #5 history of seizure disorder-continue Keppra  #6 hypertension-on losartan  and metoprolol.  #7 DVT prophylaxis-we'll start subcutaneous heparin tomorrow if hemoglobin is improved      All the records are reviewed and case discussed with Care Management/Social Workerr. Management plans discussed with the patient, family and they are in agreement.  CODE STATUS: Full Code  TOTAL TIME TAKING CARE OF THIS PATIENT: 38 minutes.   POSSIBLE D/C IN 2 DAYS, DEPENDING ON CLINICAL CONDITION.   Gladstone Lighter M.D on 01/24/2016 at 2:23 PM  Between 7am to 6pm - Pager - [phone]  After 6pm go to [email or web address] - password [email or web address] [facility]  Office  [phone]  CC: Primary care physician; Murlean Iba, MD

## 2016-01-24 NOTE — Progress Notes (Signed)
Notified Dr. Tressia Miners of hypotension; blood currently transfusing.  No new orders.

## 2016-01-24 NOTE — Progress Notes (Signed)
Start of hd 

## 2016-01-24 NOTE — Progress Notes (Signed)
[facility]  ROUNDING NOTE   Subjective:   Seen and examined on hemodialysis. Tolerating treatment well. Left temp HD catheter.     HEMODIALYSIS FLOWSHEET:  Blood Flow Rate (mL/min): 250 mL/min Arterial Pressure (mmHg): -130 mmHg Venous Pressure (mmHg): 170 mmHg Transmembrane Pressure (mmHg): 70 mmHg Ultrafiltration Rate (mL/min): 670 mL/min Dialysate Flow Rate (mL/min): 600 ml/min Conductivity: Machine : 14.1 Conductivity: Machine : 14.1 Dialysis Fluid Bolus: Normal Saline Bolus Amount (mL): 250 mL (prime) Dialysate Change:  (3k) Intra-Hemodialysis Comments: 76 mL removed. given warm blankets. no complaints. increasing bfr slowly d/t sluggish hd cath    Objective:  Vital signs in last 24 hours:  Temp:  [97.6 F (36.4 C)-98.6 F (37 C)] 98.6 F (37 C) (01/17 1007) Pulse Rate:  [77-102] 88 (01/17 1020) Resp:  [7-29] 19 (01/17 1020) BP: (90-158)/(60-106) 90/60 (01/17 1020) SpO2:  [97 %-100 %] 97 % (01/17 1020) Weight:  [60.1 kg (132 lb 8 oz)] 60.1 kg (132 lb 8 oz) (01/17 1007)  Weight change: -0.029 kg (-1 oz) Filed Weights   01/22/16 1809 01/23/16 0953 01/24/16 1007  Weight: 58.1 kg (128 lb 1.4 oz) 58.1 kg (128 lb) 60.1 kg (132 lb 8 oz)    Intake/Output: I/O last 3 completed shifts: In: 6073 [P.O.:120; I.V.:1298] Out: 1890 [Urine:250; Drains:390; Other:1000; Blood:250]   Intake/Output this shift:  No intake/output data recorded.  Physical Exam: General: NAD  Head: Normocephalic, atraumatic. Moist oral mucosal membranes  Eyes: Anicteric, PERRL  Neck: Supple, trachea midline  Lungs:  Clear to auscultation  Heart: Regular rate and rhythm  Abdomen:  Soft, nontender,   Extremities:   peripheral edema.  Neurologic: Nonfocal, moving all four extremities  Skin: No lesions  Access: Left femoral temp HD catheter    Basic Metabolic Panel:  Recent Labs Lab 01/22/16 1549 01/23/16 0506 01/24/16 0607  NA 137 139 137  K 3.9 3.7 5.0  CL 102 103  102  CO2 23 29 25   GLUCOSE 98 85 95  BUN 48* 17 35*  CREATININE 8.63* 4.23* 6.19*  CALCIUM 10.0 9.3 9.1    Liver Function Tests: No results for input(s): AST, ALT, ALKPHOS, BILITOT, PROT, ALBUMIN in the last 168 hours. No results for input(s): LIPASE, AMYLASE in the last 168 hours. No results for input(s): AMMONIA in the last 168 hours.  CBC:  Recent Labs Lab 01/22/16 1549 01/23/16 0506 01/24/16 0607 01/24/16 1015  WBC 7.5 4.9 7.6 6.8  HGB 10.4* 9.0* 7.6* 7.2*  HCT 30.5* 25.9* 23.0* 21.3*  MCV 85.5 85.7 85.6 85.5  PLT 213 139* 142* 140*    Cardiac Enzymes: No results for input(s): CKTOTAL, CKMB, CKMBINDEX, TROPONINI in the last 168 hours.  BNP: Invalid input(s): POCBNP  CBG: No results for input(s): GLUCAP in the last 168 hours.  Microbiology: Results for orders placed or performed during the hospital encounter of 01/15/16  Rapid Influenza A&B Antigens ([place] only)     Status: None   Collection Time: 01/15/16  9:37 PM  Result Value Ref Range Status   Influenza A ([place]) NEGATIVE NEGATIVE Final   Influenza B ([facility]) NEGATIVE NEGATIVE Final    Coagulation Studies:  Recent Labs  01/22/16 1549  LABPROT 18.0*  INR 1.47    Urinalysis: No results for input(s): COLORURINE, LABSPEC, PHURINE, GLUCOSEU, HGBUR, BILIRUBINUR, KETONESUR, PROTEINUR, UROBILINOGEN, NITRITE, LEUKOCYTESUR in the last 72 hours.  Invalid input(s): APPERANCEUR    Imaging: Nm Sentinel Node Injection  Result Date: 01/23/2016 CLINICAL DATA:  Biopsy proven left breast cancer.  Recent MRI showed abnormal enhancement in the right breast. Right biopsy performed 11/16/2015. EXAM: NUCLEAR MEDICINE BREAST LYMPHOSCINTIGRAPHY , BILATERAL TECHNIQUE: The procedure, risks (including but not limited to bleeding, infection, organ damage ), benefits, and alternatives were explained to the patient. Questions regarding the procedure were encouraged and answered. The patient understands and consents to the procedure.  Intradermal injection of radiopharmaceutical was performed at the 9 o'clock position of the RIGHT nipple. Intradermal injection of radiopharmaceutical was performed at the 3 o'clock position of the LEFT nipple. The patient was then sent to the operating room for sentinel node(s) identification and removal by the surgeon. RADIOPHARMACEUTICALS:  Total of 1.01+ 1.13 mCi Millipore-filtered Technetium-107m sulfur colloid IMPRESSION: Uncomplicated bilateral intradermal injection of a Technetium-52m sulfur colloid for purposes of sentinel node identification. Electronically Signed   By: Lucrezia Europe M.D.   On: 01/23/2016 11:33     Medications:   . sodium chloride 50 mL/hr at 01/23/16 1950   . acetaminophen  650 mg Oral Q6H  . Chlorhexidine Gluconate Cloth  6 each Topical Once  . cyclobenzaprine  10 mg Oral TID  . feeding supplement (ENSURE ENLIVE)  237 mL Oral TID BM  . ketorolac  15 mg Intravenous Q6H  . levETIRAcetam  500 mg Oral BID  . losartan  100 mg Oral Once per day on Sun Tue Thu Sat  . metoprolol tartrate  25 mg Oral 2 times per day on Mon Wed Fri   And  . metoprolol tartrate  50 mg Oral 2 times per day on Sun Tue Thu Sat  . pantoprazole  40 mg Oral Daily  . sertraline  100 mg Oral Daily  . sodium chloride flush  3 mL Intravenous Q12H   sodium chloride, ALPRAZolam, benzonatate, HYDROmorphone (DILAUDID) injection, ondansetron **OR** ondansetron (ZOFRAN) IV, oxyCODONE, sodium chloride flush, traMADol, zolpidem  Assessment/ Plan:  [name] is a 61 y.o. black female  with ESRD on hemodialysis, breast cancer , CHF, depression, DVT, hypertension and seizures.   [place]. / MWF  1. End Stage Renal Disease: with complication of dialysis device. With temp HD catheter left femoral Seen and examined on hemodialysis.  - Appreciate vascular input.   2. Breast Cancer: mastectomy 1/16 Dr. Azalee Course   3. Hypertension:   - home regimen of furosemide, metoprolol and losartan.    4. Anemia of chronic kidney disease: hemoglobin 7.2 on 01/15/16. Holding epo due to malignacy  5. Secondary Hyperparathyroidism: with hyperphosphatemia - sevelamer with meals - cinacalcet     LOS: 2 Aiva Miskell 1/17/201810:40 AM

## 2016-01-24 NOTE — Progress Notes (Signed)
  End of hd 

## 2016-01-24 NOTE — Progress Notes (Signed)
Post hd assessment 

## 2016-01-24 NOTE — Progress Notes (Signed)
61 yr old female with multiple medical issues POD#1 from Bilateral mastectomy with bilateral axillary node biopies and forehead lesion excision.  Patient overall doing well.  She states in less pain than expected and medications working.  She did have diaylsis today and did receive a blood transfusion.  Patient has been up to the bathroom a couple times today and did well without any dizziness.    Vitals:   01/24/16 1903 01/24/16 2057  BP: (!) 93/59 106/63  Pulse: (!) 101 93  Resp: 16 20  Temp: 98.4 F (36.9 C) 98.5 F (36.9 C)   I/O last 3 completed shifts: In: 1922 [P.O.:120; I.V.:1542; Blood:260] Out: 1600 [Drains:525; ORVIF:537; Blood:250] Total I/O In: 22 [P.O.:40] Out: 105 [Urine:50; Drains:55]   PE: Gen: NAD Forehead: incision c/d/i with sterile glue in place Left mastectomy site: incision c/d/i minimal bruising to inferior portion in spots, JP drains serosanguineous  Right mastectomy site: incision c/d/i, no ecchymosis or signs of infection, JP drains serosanguinous    A/P:  61 yr old female with multiple medical issues POD#1 from Bilateral mastectomy with bilateral axillary node biopies and forehead lesion excision.   Pain: continue scheduled tylenol, toradol, flexeril, with prn tramadol, oxycodone and diluadid iv as needed ESRD: appreciate nephrology help, dialyzed today;  Spoke with Dr. Ronalee Belts, will have attempt to place left chest dialysis perm cath on Friday AM, will make NPO after midnight on Thursday night  Breast Cancer:  Spoke with her about exercises to keep ROM 3 times daily, will continue JP drains at home, teaching to begin, may need home health to help reinforce teaching

## 2016-01-24 NOTE — Op Note (Addendum)
Bilateral Mastectomy with bilateral sentinel lymph node biopsiesProcedure Note  Indications: This patient presents for surgical treatment of left breast cancer and suspicious mass in right breast  Pre-operative Diagnosis:Left breast cancer, Right breast mass and forehead lesion  Post-operative Diagnosis: same  Surgeon: Hubbard Robinson   Assistants: Dr. Marta Lamas  Anesthesia: General endotracheal anesthesia  ASA Class: 2  Procedure Details  The patient was seen again in the Holding Room. The risks, benefits, indications, potential complications, treatment options, and expected outcomes were discussed with the patient. The possibilities of reaction to medication, pulmonary aspiration, bleeding, recurrent infection, the need for additional procedures, failure to diagnose a condition, and creating a complication requiring transfusion or further operation were discussed with the patient. The patient and/or family concurred with the proposed plan, giving informed consent. The site of surgery was properly noted/marked. The patient was taken to the Operating Room, identified as Paddy Walthall and the procedure verified as Bilateral mastectomy with bilateral axillary sentinel lymph node biopsies and forehead lesion excision. A Time Out was held and the above information confirmed.  The patient was placed supine and general anesthetic was administered.  The forehead was prepped and draped in the usual sterile fashion.  15 blade scapel was used to made an elliptical incision around the 1cm lesion.  Bovie electrocautery was used to dissected the sebaceous cyst sac and sent to pathology.  Wound was then irrigated and hemostasis achieved with electrocautery.  The wound was then closed in layers with 3-0 vicryl in interrupted fashion and 4-0 Monocryl subcuticular running suture.     The both arms, both breasts, and chest were prepped and draped in standard fashion.  An oblique elliptical incision was made  encompassing the nipple first on the right. Skin flaps were created meticulously to preserve the subdermal blood supply and maintain a clear margin of resection around the tumor. Dissection was carried down to the pectoralis fascia, which was included with the specimen the specimen was labeled and sent to pathology.  Using a hand-held gamma probe, axillary sentinel nodes were identified. Dissection was carried through the clavipectoral fascia.  2  axillary sentinel nodes were removed and submitted to pathology for routine.  The incision was irrigated out and hemostasis achieved. Two JP drains were placed. One was placed within the lower flap and second was placed laterally in the axillary dissection region. The skin incision was closed in layers with a 4-0 Monocryl subcuticular closure. The drains were secured with 2-0 silk sutures.     Attention was then turned to the left and oblique elliptical incision was made encompassing the nipple. Skin flaps were created meticulously to preserve the subdermal blood supply and maintain a clear margin of resection around the tumor. Dissection was carried down to the pectoralis fascia, which was included with the specimen the specimen was labeled and sent to pathology.  Using a hand-held gamma probe, axillary sentinel nodes were identified. Dissection was carried through the clavipectoral fascia.  6  axillary sentinel nodes were removed.  The first 3 were submitted to pathology for frozen section and called back stating there were cancerous cells in the first two but the third lymph node had no cancer cells.  The other three nodes  , #4, 5 and 6 were sent routine.  The incision was irrigated out and hemostasis achieved. Two JP drains were placed. One was placed within the lower flap and second was placed laterally in the axillary dissection region. The skin incision was closed in  layers with a 4-0 Monocryl subcuticular closure. The drains were secured with 2-0 silk sutures.   The wound was irrigated. Two JP drains were placed. One was placed within the lower flap and second was placed laterally in the axillary dissection region. The skin incision was closed in layers with a 4-0 Monocryl subcuticular closure. The drains were secured with 2-0 silk sutures. Sterile glue was applied along with a dry bulky gauze dressing.  Instrument, sponge, and needle counts were correct at closure and at the conclusion of the case.   Findings: Right breast mass 4cm in size within the specimen good margins, 2 sentinel lymph nodes from right side;  Left breast mass non-palpable good margins, 6 sentinel lymph nodes biopied  Estimated Blood Loss: Minimal         Drains: Two JP drains placed as above         Total IV Fluids: 1011mL         Specimens: Breast with axillary contents   Complications: None; patient tolerated the procedure well.         Disposition: PACU - hemodynamically stable.         Condition: stable

## 2016-01-24 NOTE — Progress Notes (Signed)
Hemodialysis- Per Dr. Juleen China pt is to have 1 unit of PRBCs today. Spoke with primary RN. Pt needs type and screen. Lab notified. Will give blood in HD if blood ready in time. Patient currently has 1 hour and 47 minutes remaining.

## 2016-01-24 NOTE — Progress Notes (Signed)
Post hd vitals 

## 2016-01-24 NOTE — Progress Notes (Signed)
Pre hd info 

## 2016-01-25 LAB — CBC
HEMATOCRIT: 22.2 % — AB (ref 35.0–47.0)
Hemoglobin: 7.7 g/dL — ABNORMAL LOW (ref 12.0–16.0)
MCH: 29.3 pg (ref 26.0–34.0)
MCHC: 34.6 g/dL (ref 32.0–36.0)
MCV: 84.8 fL (ref 80.0–100.0)
PLATELETS: 106 10*3/uL — AB (ref 150–440)
RBC: 2.62 MIL/uL — ABNORMAL LOW (ref 3.80–5.20)
RDW: 17.3 % — AB (ref 11.5–14.5)
WBC: 6.5 10*3/uL (ref 3.6–11.0)

## 2016-01-25 MED ORDER — HEPARIN SODIUM (PORCINE) 5000 UNIT/ML IJ SOLN
5000.0000 [IU] | Freq: Three times a day (TID) | INTRAMUSCULAR | Status: DC
  Administered 2016-01-25 – 2016-01-29 (×11): 5000 [IU] via SUBCUTANEOUS
  Filled 2016-01-25 (×11): qty 1

## 2016-01-25 MED ORDER — SODIUM CHLORIDE 0.9 % IV BOLUS (SEPSIS)
250.0000 mL | Freq: Once | INTRAVENOUS | Status: AC
Start: 2016-01-25 — End: 2016-01-25
  Administered 2016-01-25: 250 mL via INTRAVENOUS

## 2016-01-25 NOTE — Progress Notes (Signed)
[facility]  ROUNDING NOTE   Subjective:   Hemodialysis yesterday. Tolerated treatment well. PRBC transfusion during treatment. UF total of 847mL  Pain well controlled this morning.   Objective:  Vital signs in last 24 hours:  Temp:  [97.3 F (36.3 C)-98.7 F (37.1 C)] 97.3 F (36.3 C) (01/18 0750) Pulse Rate:  [86-101] 95 (01/18 0905) Resp:  [16-25] 16 (01/18 0750) BP: (84-106)/(53-63) 98/60 (01/18 0908) SpO2:  [87 %-100 %] 98 % (01/18 0905) Weight:  [59.3 kg (130 lb 11.7 oz)] 59.3 kg (130 lb 11.7 oz) (01/17 1243)  Weight change: 2.041 kg (4 lb 8 oz) Filed Weights   01/23/16 0953 01/24/16 1007 01/24/16 1243  Weight: 58.1 kg (128 lb) 60.1 kg (132 lb 8 oz) 59.3 kg (130 lb 11.7 oz)    Intake/Output: I/O last 3 completed shifts: In: 4132 [P.O.:180; I.V.:717; Blood:260] Out: 4401 [Urine:50; Drains:545; Other:825]   Intake/Output this shift:  Total I/O In: 360 [P.O.:360] Out: 20 [Drains:20]  Physical Exam: General: NAD  Head: Normocephalic, atraumatic. Moist oral mucosal membranes  Eyes: Anicteric, PERRL  Neck: Supple, trachea midline  Lungs:  Clear to auscultation  Heart: Regular rate and rhythm  Abdomen:  Soft, nontender,   Extremities:  no peripheral edema.  Neurologic: Nonfocal, moving all four extremities  Skin: No lesions  Access: Left femoral temp HD catheter, nonfunctioning Right femoral permcath    Basic Metabolic Panel:  Recent Labs Lab 01/22/16 1549 01/23/16 0506 01/24/16 0607 01/24/16 1015  NA 137 139 137 137  K 3.9 3.7 5.0 4.4  CL 102 103 102 102  CO2 23 29 25 25   GLUCOSE 98 85 95 116*  BUN 48* 17 35* 36*  CREATININE 8.63* 4.23* 6.19* 6.66*  CALCIUM 10.0 9.3 9.1 8.7*  PHOS  --   --   --  5.6*    Liver Function Tests:  Recent Labs Lab 01/24/16 1015  ALBUMIN 2.6*   No results for input(s): LIPASE, AMYLASE in the last 168 hours. No results for input(s): AMMONIA in the last 168 hours.  CBC:  Recent Labs Lab  01/22/16 1549 01/23/16 0506 01/24/16 0607 01/24/16 1015 01/25/16 0447  WBC 7.5 4.9 7.6 6.8 6.5  HGB 10.4* 9.0* 7.6* 7.2* 7.7*  HCT 30.5* 25.9* 23.0* 21.3* 22.2*  MCV 85.5 85.7 85.6 85.5 84.8  PLT 213 139* 142* 140* 106*    Cardiac Enzymes: No results for input(s): CKTOTAL, CKMB, CKMBINDEX, TROPONINI in the last 168 hours.  BNP: Invalid input(s): POCBNP  CBG: No results for input(s): GLUCAP in the last 168 hours.  Microbiology: Results for orders placed or performed during the hospital encounter of 01/15/16  Rapid Influenza A&B Antigens ([place] only)     Status: None   Collection Time: 01/15/16  9:37 PM  Result Value Ref Range Status   Influenza A ([facility]) NEGATIVE NEGATIVE Final   Influenza B ([facility]) NEGATIVE NEGATIVE Final    Coagulation Studies:  Recent Labs  01/22/16 1549  LABPROT 18.0*  INR 1.47    Urinalysis: No results for input(s): COLORURINE, LABSPEC, PHURINE, GLUCOSEU, HGBUR, BILIRUBINUR, KETONESUR, PROTEINUR, UROBILINOGEN, NITRITE, LEUKOCYTESUR in the last 72 hours.  Invalid input(s): APPERANCEUR    Imaging: No results found.   Medications:    . acetaminophen  650 mg Oral Q6H  . Chlorhexidine Gluconate Cloth  6 each Topical Once  . cyclobenzaprine  10 mg Oral TID  . feeding supplement (ENSURE ENLIVE)  237 mL Oral TID BM  . heparin subcutaneous  5,000 Units Subcutaneous  Q8H  . levETIRAcetam  500 mg Oral BID  . pantoprazole  40 mg Oral Daily  . sertraline  100 mg Oral Daily  . sodium chloride flush  3 mL Intravenous Q12H   sodium chloride, ALPRAZolam, benzonatate, HYDROmorphone (DILAUDID) injection, ondansetron **OR** ondansetron (ZOFRAN) IV, oxyCODONE, sodium chloride flush, traMADol, zolpidem  Assessment/ Plan:  [name] is a 61 y.o. black female  with ESRD on hemodialysis, breast cancer , CHF, depression, DVT, hypertension and seizures.   [place]. / MWF  1. End Stage Renal Disease: with complication of dialysis  device. With temp HD catheter left femoral - Appreciate vascular input. Will need functioning tunneled catheter before discharge.   2. Breast Cancer: mastectomy 1/16 Dr. Azalee Course.    3. Hypertension:  Hypotensive. Holding home blood pressure medications currently.  - home regimen of furosemide, metoprolol and losartan.   4. Anemia of chronic kidney disease: hemoglobin 7.7, status post PRBC transfusion on 1/17.  - Holding epo due to malignacy  5. Secondary Hyperparathyroidism: with hyperphosphatemia 5.6 - sevelamer with meals - cinacalcet     LOS: 3 Abeer Deskins 1/18/201811:39 AM

## 2016-01-25 NOTE — Progress Notes (Signed)
Per MD, get BP in Right arm.

## 2016-01-25 NOTE — Progress Notes (Addendum)
[facility] [place] NAME: Teresa Cook    MR#:  [id number]  DATE OF BIRTH:  March 08, 1955  SUBJECTIVE:  CHIEF COMPLAINT:  No chief complaint on file.  - had bilateral mastectomy, complains of pain - dialysis yesterday. For permacath tomorrow - received 1 unit prbc yesterday, hb at 7.7  REVIEW OF SYSTEMS:  Review of Systems  Constitutional: Positive for malaise/fatigue. Negative for chills, fever and weight loss.  HENT: Negative for ear discharge, ear pain, hearing loss and nosebleeds.   Eyes: Negative for blurred vision, double vision and photophobia.  Respiratory: Negative for cough, hemoptysis, shortness of breath and wheezing.   Cardiovascular: Positive for chest pain. Negative for palpitations, orthopnea and leg swelling.  Gastrointestinal: Negative for abdominal pain, constipation, diarrhea, heartburn, melena, nausea and vomiting.  Genitourinary: Negative for dysuria.  Musculoskeletal: Positive for back pain and myalgias. Negative for neck pain.  Skin: Negative for rash.  Neurological: Negative for dizziness, speech change, focal weakness and headaches.  Endo/Heme/Allergies: Does not bruise/bleed easily.  Psychiatric/Behavioral: Negative for depression.    DRUG ALLERGIES:   Allergies  Allergen Reactions  . Gabapentin Other (See Comments)    Seizure  . Adhesive [Tape] Itching    Silk tape is ok to use.    VITALS:  Blood pressure 98/60, pulse 95, temperature 97.3 F (36.3 C), temperature source Oral, resp. rate 16, height 5\' 6"  (1.676 m), weight 59.3 kg (130 lb 11.7 oz), SpO2 98 %.  PHYSICAL EXAMINATION:  Physical Exam  GENERAL:  61 y.o.-year-old patient lying in the bed with no acute distress.  EYES: Pupils equal, round, reactive to light and accommodation. No scleral icterus. Extraocular muscles intact.  HEENT: Head atraumatic, normocephalic. Oropharynx and nasopharynx clear.  NECK:  Supple, no jugular venous  distention. No thyroid enlargement, no tenderness.  LUNGS and CHEST: Normal breath sounds bilaterally, no wheezing, rales,rhonchi or crepitation. No use of accessory muscles of respiration.  S/p bilateral mastectomy and dressing in place CARDIOVASCULAR: S1, S2 normal. No murmurs, rubs, or gallops.  ABDOMEN: Soft, nontender, nondistended. Bowel sounds present. No organomegaly or mass.  EXTREMITIES: No pedal edema, cyanosis, or clubbing. Has right groin permacath and left groin temp dialysis catheter NEUROLOGIC: Cranial nerves II through XII are intact. Muscle strength 5/5 in all extremities. Sensation intact. Gait not checked. Following commands PSYCHIATRIC: The patient is alert and oriented x 3.  SKIN: No obvious rash, lesion, or ulcer.    LABORATORY PANEL:   CBC  Recent Labs Lab 01/25/16 0447  WBC 6.5  HGB 7.7*  HCT 22.2*  PLT 106*   ------------------------------------------------------------------------------------------------------------------  Chemistries   Recent Labs Lab 01/24/16 1015  NA 137  K 4.4  CL 102  CO2 25  GLUCOSE 116*  BUN 36*  CREATININE 6.66*  CALCIUM 8.7*   ------------------------------------------------------------------------------------------------------------------  Cardiac Enzymes No results for input(s): TROPONINI in the last 168 hours. ------------------------------------------------------------------------------------------------------------------  RADIOLOGY:  No results found.  EKG:   Orders placed or performed in visit on 01/18/16  . EKG 12-Lead    ASSESSMENT AND PLAN:   61 year old female with new diagnosis of breast mass diagnosed to be malignant, end-stage renal disease on Monday Wednesday Friday hemodialysis, anemia of chronic disease, history of DVT on Coumadin, hypertension, hyperparathyroidism admitted for bilateral mastectomy but also noted to have her dialysis catheter clotted off.  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 -  Management per surgical team. -Pain control recommended.  #2 end-stage renal disease on hemodialysis-the right groin permacath is clotted off, has a left groin temporary dialysis catheter placed. Appreciate nephrology and vascular surgery consults. -will need a permacath placement prior to discharge. Possible placement tomorrow - Dialysis done wednesday.  #3 anemia of chronic disease-acute on chronic anemia after surgery. Cannot give Epogen due to her diagnosis of cancer. -1 unit packed RBC transfusion yesterday, hb at 7.7, recheck in AM- might need another unit with dialysis tomorrow.  #4 history of DVT-due to recent surgery and anemia, continue to hold Coumadin at this time.  #5 history of seizure disorder-continue Keppra  #6 hypertension-hold losartan and metoprolol due to hypotension  #7 DVT prophylaxis-start subcutaneous heparin today  Physical Therapy once groin catheters are removed.      All the records are reviewed and case discussed with Care Management/Social Workerr. Management plans discussed with the patient, family and they are in agreement.  CODE STATUS: Full Code  TOTAL TIME TAKING CARE OF THIS PATIENT: 33 minutes.   POSSIBLE D/C IN 1-2 DAYS, DEPENDING ON CLINICAL CONDITION.   Gladstone Lighter M.D on 01/25/2016 at 10:52 AM  Between 7am to 6pm - Pager - [phone]  After 6pm go to [email or web address] - password [email or web address] [facility]  Office  [phone]  CC: Primary care physician; Murlean Iba, MD

## 2016-01-25 NOTE — Progress Notes (Signed)
Notified dr.kolloru of pt b/p of 82/52 manual. Acknowledged and no new orders.

## 2016-01-25 NOTE — Progress Notes (Signed)
01/25/2016  Subjective: Patient is 2 Days Post-Op s/p bilateral mastectomy with sentinel node biopsy.  No acute events overnight.  Had dialysis yesterday which she tolerated well.  Reports soreness over bilateral incisions, but controlled with pain medications.  Vital signs: Temp:  [97.3 F (36.3 C)-98.7 F (37.1 C)] 97.3 F (36.3 C) (01/18 0750) Pulse Rate:  [86-101] 95 (01/18 0905) Resp:  [16-25] 16 (01/18 0750) BP: (84-106)/(53-63) 98/60 (01/18 0908) SpO2:  [87 %-100 %] 98 % (01/18 0905) Weight:  [59.3 kg (130 lb 11.7 oz)] 59.3 kg (130 lb 11.7 oz) (01/17 1243)   Intake/Output: 01/17 0701 - 01/18 0700 In: 482 [P.O.:60; I.V.:162; Blood:260] Out: 1140 [Urine:50; Drains:265] Last BM Date: 01/22/16  Physical Exam: Constitutional: No acute distress Chest:  Bilateral mastectomy incisions are clean, dry, intact.  JP x 4 (2 on each side) with serosanguinous fluid.  No evidence of infection.  Labs:   Recent Labs  01/24/16 1015 01/25/16 0447  WBC 6.8 6.5  HGB 7.2* 7.7*  HCT 21.3* 22.2*  PLT 140* 106*    Recent Labs  01/24/16 0607 01/24/16 1015  NA 137 137  K 5.0 4.4  CL 102 102  CO2 25 25  GLUCOSE 95 116*  BUN 35* 36*  CREATININE 6.19* 6.66*  CALCIUM 9.1 8.7*    Recent Labs  01/22/16 1549  LABPROT 18.0*  INR 1.47    Imaging: No results found.  Assessment/Plan: 61 yo female s/p bilateral mastectomies with sentinel node biopsy.  --Appreciate nephrology and medicine input in her management. --Patient will be NPO after midnight tonight for new tunneled vas cath placement tomorrow with vascular surgery. --Continue holding coumadin today, likely will restart tomorrow after her catheter is placed.   Melvyn Neth, [place]

## 2016-01-26 ENCOUNTER — Encounter
Admission: AD | Disposition: A | Discharge status: Home Health | Payer: Self-pay | Source: Ambulatory Visit | Attending: Surgery

## 2016-01-26 DIAGNOSIS — T82868A Thrombosis of vascular prosthetic devices, implants and grafts, initial encounter: Secondary | ICD-10-CM

## 2016-01-26 DIAGNOSIS — N186 End stage renal disease: Secondary | ICD-10-CM

## 2016-01-26 HISTORY — PX: PERIPHERAL VASCULAR CATHETERIZATION: SHX172C

## 2016-01-26 LAB — BASIC METABOLIC PANEL
Anion gap: 9 (ref 5–15)
BUN: 40 mg/dL — ABNORMAL HIGH (ref 6–20)
CHLORIDE: 103 mmol/L (ref 101–111)
CO2: 26 mmol/L (ref 22–32)
CREATININE: 7.33 mg/dL — AB (ref 0.44–1.00)
Calcium: 9.1 mg/dL (ref 8.9–10.3)
GFR calc Af Amer: 6 mL/min — ABNORMAL LOW (ref >60)
GFR calc non Af Amer: 5 mL/min — ABNORMAL LOW (ref >60)
GLUCOSE: 92 mg/dL (ref 65–99)
Potassium: 4.3 mmol/L (ref 3.5–5.1)
SODIUM: 138 mmol/L (ref 135–145)

## 2016-01-26 LAB — CBC
HCT: 19.9 % — ABNORMAL LOW (ref 35.0–47.0)
Hemoglobin: 6.7 g/dL — ABNORMAL LOW (ref 12.0–16.0)
MCH: 28.6 pg (ref 26.0–34.0)
MCHC: 33.6 g/dL (ref 32.0–36.0)
MCV: 85.1 fL (ref 80.0–100.0)
PLATELETS: 107 10*3/uL — AB (ref 150–440)
RBC: 2.34 MIL/uL — ABNORMAL LOW (ref 3.80–5.20)
RDW: 17.6 % — ABNORMAL HIGH (ref 11.5–14.5)
WBC: 4.8 10*3/uL (ref 3.6–11.0)

## 2016-01-26 LAB — PHOSPHORUS: Phosphorus: 5.8 mg/dL — ABNORMAL HIGH (ref 2.5–4.6)

## 2016-01-26 LAB — MAGNESIUM: MAGNESIUM: 1.8 mg/dL (ref 1.7–2.4)

## 2016-01-26 LAB — PROTIME-INR
INR: 1.18
Prothrombin Time: 15.1 seconds (ref 11.4–15.2)

## 2016-01-26 LAB — SURGICAL PATHOLOGY

## 2016-01-26 LAB — PREPARE RBC (CROSSMATCH)

## 2016-01-26 SURGERY — DIALYSIS/PERMA CATHETER INSERTION
Anesthesia: Moderate Sedation

## 2016-01-26 MED ORDER — HEPARIN SODIUM (PORCINE) 10000 UNIT/ML IJ SOLN
INTRAMUSCULAR | Status: AC
  Filled 2016-01-26: qty 1

## 2016-01-26 MED ORDER — HEPARIN (PORCINE) IN NACL 2-0.9 UNIT/ML-% IJ SOLN
INTRAMUSCULAR | Status: AC
  Filled 2016-01-26: qty 500

## 2016-01-26 MED ORDER — SODIUM CHLORIDE 0.9 % IV SOLN: Freq: Once | INTRAVENOUS | Status: DC

## 2016-01-26 MED ORDER — TRAMADOL HCL 50 MG PO TABS
ORAL_TABLET | ORAL | Status: AC
  Filled 2016-01-26: qty 1

## 2016-01-26 MED ORDER — MIDAZOLAM HCL 2 MG/2ML IJ SOLN
INTRAMUSCULAR | Status: DC | PRN
  Administered 2016-01-26: 2 mg via INTRAVENOUS
  Administered 2016-01-26 (×3): 1 mg via INTRAVENOUS

## 2016-01-26 MED ORDER — WARFARIN SODIUM 5 MG PO TABS
7.5000 mg | ORAL_TABLET | Freq: Once | ORAL | Status: AC
  Administered 2016-01-26: 7.5 mg via ORAL
  Filled 2016-01-26: qty 1
  Filled 2016-01-26: qty 2

## 2016-01-26 MED ORDER — LIDOCAINE-EPINEPHRINE (PF) 2 %-1:200000 IJ SOLN
INTRAMUSCULAR | Status: AC
  Filled 2016-01-26: qty 20

## 2016-01-26 MED ORDER — DIPHENHYDRAMINE HCL 50 MG/ML IJ SOLN
INTRAMUSCULAR | Status: DC | PRN
  Administered 2016-01-26: 50 mg via INTRAVENOUS

## 2016-01-26 MED ORDER — FENTANYL CITRATE (PF) 100 MCG/2ML IJ SOLN
INTRAMUSCULAR | Status: AC
  Filled 2016-01-26: qty 2

## 2016-01-26 MED ORDER — FENTANYL CITRATE (PF) 100 MCG/2ML IJ SOLN
INTRAMUSCULAR | Status: DC | PRN
  Administered 2016-01-26 (×2): 50 ug via INTRAVENOUS

## 2016-01-26 MED ORDER — MIDAZOLAM HCL 5 MG/5ML IJ SOLN
INTRAMUSCULAR | Status: AC
  Filled 2016-01-26: qty 5

## 2016-01-26 MED ORDER — DIPHENHYDRAMINE HCL 50 MG/ML IJ SOLN
INTRAMUSCULAR | Status: AC
  Filled 2016-01-26: qty 1

## 2016-01-26 MED ORDER — CEFAZOLIN SODIUM-DEXTROSE 2-4 GM/100ML-% IV SOLN
2.0000 g | INTRAVENOUS | Status: AC
  Administered 2016-01-26: 2 g via INTRAVENOUS
  Filled 2016-01-26: qty 100

## 2016-01-26 MED ORDER — WARFARIN - PHARMACIST DOSING INPATIENT
Freq: Every day | Status: DC
  Administered 2016-01-27 – 2016-01-28 (×2)

## 2016-01-26 SURGICAL SUPPLY — 12 items
CATH PALINDROME RT-P 15FX23CM (CATHETERS) ×2 IMPLANT
CATH PALINDROME-P 23CM W/VT (CATHETERS) ×2 IMPLANT
DERMABOND ADVANCED (GAUZE/BANDAGES/DRESSINGS) ×1
DERMABOND ADVANCED .7 DNX12 (GAUZE/BANDAGES/DRESSINGS) ×1 IMPLANT
DRAPE INCISE IOBAN 66X45 STRL (DRAPES) ×2 IMPLANT
KIT CATH CVC 3 LUMEN 7FR 8IN (MISCELLANEOUS) ×2 IMPLANT
NEEDLE ENTRY 21GA 7CM ECHOTIP (NEEDLE) ×2 IMPLANT
PACK ANGIOGRAPHY (CUSTOM PROCEDURE TRAY) ×2 IMPLANT
SET INTRO CAPELLA COAXIAL (SET/KITS/TRAYS/PACK) ×2 IMPLANT
SUT MNCRL AB 4-0 PS2 18 (SUTURE) ×2 IMPLANT
SUT SILK 0 FSL (SUTURE) ×2 IMPLANT
TOWEL OR 17X26 4PK STRL BLUE (TOWEL DISPOSABLE) ×2 IMPLANT

## 2016-01-26 NOTE — Progress Notes (Signed)
Pre-hd tx 

## 2016-01-26 NOTE — Op Note (Signed)
[facility]   OPERATIVE NOTE     PROCEDURE: 1. Insertion of a left tunneled dialysis catheter. 2. Catheter placement and cannulation under ultrasound and fluoroscopic guidance 3.  Exchange of left femoral central line for new central line over wire same venous access 4.  Removal of right femoral tunneled dialysis catheter  PRE-OPERATIVE DIAGNOSIS: end-stage renal requiring hemodialysis; complication of tunneled catheter with non-flow and poor function right femoral; lack of appropriate intravenous access  POST-OPERATIVE DIAGNOSIS: same as above  SURGEON: Hortencia Pilar  ANESTHESIA: Conscious sedation was administered under my direct supervision. IV Versed plus fentanyl were utilized. Continuous ECG, pulse oximetry and blood pressure was monitored throughout the entire procedure.  Conscious sedation was for a total of 55 minutes.  ESTIMATED BLOOD LOSS: Minimal  FINDING(S): 1.  Tips of the catheter in the right atrium on fluoroscopy 2.  No obvious pneumothorax on fluoroscopy  SPECIMEN(S):  none  INDICATIONS:   Teresa Cook is a 61 y.o. female  presents with end stage renal disease.  Therefore, the patient requires a tunneled dialysis catheter placement.  The patient is informed of  the risks catheter placement include but are not limited to: bleeding, infection, central venous injury, pneumothorax, possible venous stenosis, possible malpositioning in the venous system, and possible infections related to long-term catheter presence.  The patient was aware of these risks and agreed to proceed.  DESCRIPTION: The patient was taken back to Special Procedure suite.  Prior to sedation, the patient was given IV antibiotics.  After obtaining adequate sedation, the patient was prepped and draped in the standard fashion for a chest or neck tunneled dialysis catheter placement.  Appropriate Time Out is called.   The the right neck and chest wall are then infiltrated  with 1% Lidocaine with epinepherine.  A 23 cm tip to cuff palindrome catheter is then selected, opened on the back table and prepped. Under ultrasound guidance, the left internal jugular vein was cannulated with the Seldinger needle.  A J-wire was then advanced under fluoroscopic guidance into the inferior vena cava and the wire was secured.  Small counter incision was then made at the wire insertion site. A small pocket was fashioned with blunt dissection to allow easier passage of the cuff.  The dilator and peel-away sheath are then advanced over the wire under fluoroscopic guidance. The catheters and advanced through the peel-away sheath over the wire. The wires then removed It is approximated to the chest wall after verifying the tips at the atrial caval junction and an exit site is selected.  Small incision is made at the selected exit site and the tunneling device was passed subcutaneously to the neck counter incision. Catheter is then pulled through the subcutaneous tunnel. The catheter is then verified for tip position under fluoroscopy, transected and the hub assembly connected.    Each port was tested by aspirating and flushing.  No resistance was noted.  Each port was then thoroughly flushed with heparinized saline.  The catheter was secured in placed with two interrupted stitches of 0 silk tied to the catheter.  The neck incision was closed with a U-stitch of 4-0 Monocryl.  The neck and chest incision were cleaned and sterile bandages applied including a Biopatch.  Each port was then packed with concentrated heparin (1000 Units/mL) at the manufacturer recommended volumes to each port.  Sterile caps were applied to each port.  On completion fluoroscopy, the tips of the catheter were in the right atrium, and there  was no evidence of pneumothorax.  Attention is then turned to the left femoral central line which is prepped and draped in a sterile fashion. The sutures securing the catheter are removed  and the catheter is pulled back approximately 5 cm catheter is then controlled with a hemostat it is transected with scissors and a wire is introduced into the central lumen and advanced without difficulty. The remaining portion of the catheter is then removed and a triple-lumen catheter is advanced over the wire without difficulty. All 3 lm aspirate and flush easily and are packed with heparinized saline. The central line is then secured to the skin of the left thigh with 0 silk sutures and a Biopatch and sterile dressing are placed.  Attention is then turned to the right groin where the nonfunctioning tunneled catheter is. Sutures are removed and the area is anesthetized with lidocaine. Scalpel was used to incise the exit site and a hemostat is used to free the cuff and the catheters removed. Pressures held for 5 minutes. Sterile dressing is applied.  COMPLICATIONS: None  CONDITION: Unchanged   Hortencia Pilar [facility] Office: [phone]   01/26/2016, 4:45 PM

## 2016-01-26 NOTE — Progress Notes (Signed)
ANTICOAGULATION CONSULT NOTE - Initial Consult  Pharmacy Consult for Warfarin Indication:History of  DVT  Allergies  Allergen Reactions  . Gabapentin Other (See Comments)    Seizure  . Adhesive [Tape] Itching    Silk tape is ok to use.    Patient Measurements: Height: 5\' 6"  (167.6 cm) Weight: 130 lb 11.7 oz (59.3 kg) IBW/kg (Calculated) : 59.3  Vital Signs: Temp: 97.8 F (36.6 C) (01/19 1527) Temp Source: Oral (01/19 1527) BP: 114/66 (01/19 1527) Pulse Rate: 84 (01/19 1527)  Labs:  Recent Labs  01/24/16 0607 01/24/16 1015 01/25/16 0447 01/26/16 0418  HGB 7.6* 7.2* 7.7* 6.7*  HCT 23.0* 21.3* 22.2* 19.9*  PLT 142* 140* 106* 107*  CREATININE 6.19* 6.66*  --  7.33*    Estimated Creatinine Clearance: 7.4 mL/min (by C-G formula based on SCr of 7.33 mg/dL (H)).   Medical History: Past Medical History:  Diagnosis Date  . Anemia   . Anxiety   . Breast cancer ([place]) 01/2016   bilateral  . CHF (congestive heart failure) ([place])   . Chronic kidney disease   . Depression   . Dialysis patient ([place])   . DVT (deep venous thrombosis) ([facility])    left leg  . DVT (deep venous thrombosis) ([place]) 1985   right thigh  . Dysrhythmia   . Gout   . Headache   . HTN (hypertension)   . Hypertension   . Parathyroid abnormality ([place])   . Parathyroid disease ([place])   . Pneumonia 12/2015  . Psoriasis   . Renal insufficiency   . Sickle cell trait ([facility])    traits    Assessment: 61 yo female with PMH of DVT. Pharmacy consulted for warfarin dosing. Patient was taking warfarin prior to admission. INR on admission on 1/15 was 1.47 (held for surgery). Patient has not had any warfarin during hospital stay.   HOME REGIMEN:   Warfarin 7.5mg : Mon, Tues, Weds, Thurs                                    Warfarin 5mg  Fri, Sat, Sun   Goal of Therapy:  INR 2-3 Monitor platelets by anticoagulation protocol: Yes   Plan:  Will start patient on warfarin 7.5mg  x1 dose, then will plan to continue  patient's home regimen.  Monitor H & H. Hgb was 6.7 this morning.  Patient also on heparin SubQ while INR subtherapeutic. Will D/C once INR within goal.  INR daily until therapeutic.   Pernell Dupre, PharmD, BCPS Clinical Pharmacist 01/26/2016 7:48 PM

## 2016-01-26 NOTE — Care Management Important Message (Signed)
Important Message  Patient Details  Name: Teresa Cook MRN: [id number] Date of Birth: [birth year]   Medicare Important Message Given:  Yes    Beverly Sessions, RN 01/26/2016, 2:43 PM

## 2016-01-26 NOTE — Progress Notes (Signed)
Post Hemodialysis: Only completed 69minutes of treatment due to poor CVC function.Dr.Kolluru bedside and ordered to end tx.Consult placed with vascular for possible permcath placement today,then dialysis if cvc placed.Patient informed,verbalized understanding.No shortness of breath noted.

## 2016-01-26 NOTE — Progress Notes (Signed)
61 yr old female with multiple medical issues POD#3 from Bilateral mastectomy with bilateral axillary node biopies and forehead lesion excision.  Patient overall doing well.   She states pain well controlled and her neuropathy acutally improved over the past few days as well.     Vitals:   01/26/16 1500 01/26/16 1527  BP: 117/71 114/66  Pulse:  84  Resp: 16 16  Temp:  97.8 F (36.6 C)   I/O last 3 completed shifts: In: 837 [P.O.:837] Out: -385 [Drains:115] No intake/output data recorded.   PE: Gen: NAD Forehead: incision c/d/i with sterile glue in place Left mastectomy site: incision c/d/i minimal bruising to inferior portion in spots, JP drains serosanguineous  Right mastectomy site: incision c/d/i, no ecchymosis or signs of infection, JP drains serosanguinous    A/P:  61 yr old female with multiple medical issues POD#1 from Bilateral mastectomy with bilateral axillary node biopies and forehead lesion excision.   Pain: continue scheduled tylenol, toradol, flexeril, with prn tramadol, oxycodone and diluadid iv as needed ESRD: Left IJ perm cath placed today, supposed to be dialyzed tonight since unable with temp cath earlier today, to get blood with dialysis   Breast Cancer:  Spoke with her about exercises to keep ROM 3 times daily, will continue JP drains at home, teaching to begin, may need home health to help reinforce teaching

## 2016-01-26 NOTE — Progress Notes (Signed)
Post hd tx 

## 2016-01-26 NOTE — Progress Notes (Signed)
Hemodialysis ended

## 2016-01-26 NOTE — H&P (Signed)
[facility] History & Physical Update  The patient was interviewed and re-examined.  The patient's previous History and Physical has been reviewed and is unchanged.  There is no change in the plan of care. We plan to proceed with the scheduled procedure.  Hortencia Pilar, MD  01/26/2016, 1:40 PM

## 2016-01-26 NOTE — Progress Notes (Signed)
Will sign off and follow as needed Please call Dr. Fritzi Mandes if any questions over the weekend. Discussed with Dr. Azalee Course

## 2016-01-26 NOTE — Progress Notes (Signed)
Notified dr.kolloru of pt hemoglobin of 6.7 this am. Acknowledged and stated will give blood in dialysis.

## 2016-01-26 NOTE — Progress Notes (Signed)
Hemodialysis treatment ended 62minutes early due to signs of venous chamber clotthing in dialysis lines.No blood loss,patient rinsed back successfully.

## 2016-01-26 NOTE — Progress Notes (Addendum)
[facility] NAME: Teresa Cook    MR#:  [id number]  DATE OF BIRTH:  [birth year]  SUBJECTIVE:  CHIEF COMPLAINT:  No chief complaint on file.  - had bilateral mastectomy, complains of pain - dialysis today after permacath placement - received 1 unit prbc, hb still at 6.7- 2 more units with dialysis today  REVIEW OF SYSTEMS:  Review of Systems  Constitutional: Positive for malaise/fatigue. Negative for chills, fever and weight loss.  HENT: Negative for ear discharge, ear pain, hearing loss and nosebleeds.   Eyes: Negative for blurred vision, double vision and photophobia.  Respiratory: Negative for cough, hemoptysis, shortness of breath and wheezing.   Cardiovascular: Positive for chest pain. Negative for palpitations, orthopnea and leg swelling.  Gastrointestinal: Negative for abdominal pain, constipation, diarrhea, heartburn, melena, nausea and vomiting.  Genitourinary: Negative for dysuria.  Musculoskeletal: Positive for back pain and myalgias. Negative for neck pain.  Skin: Negative for rash.  Neurological: Negative for dizziness, speech change, focal weakness and headaches.  Endo/Heme/Allergies: Does not bruise/bleed easily.  Psychiatric/Behavioral: Negative for depression.    DRUG ALLERGIES:   Allergies  Allergen Reactions  . Gabapentin Other (See Comments)    Seizure  . Adhesive [Tape] Itching    Silk tape is ok to use.    VITALS:  Blood pressure 114/66, pulse 84, temperature 97.8 F (36.6 C), temperature source Oral, resp. rate 16, height 5\' 6"  (1.676 m), weight 59.3 kg (130 lb 11.7 oz), SpO2 97 %.  PHYSICAL EXAMINATION:  Physical Exam  GENERAL:  61 y.o.-year-old patient lying in the bed with no acute distress.  EYES: Pupils equal, round, reactive to light and accommodation. No scleral icterus. Extraocular muscles intact.  HEENT: Head atraumatic, normocephalic. Oropharynx and nasopharynx clear.  NECK:   Supple, no jugular venous distention. No thyroid enlargement, no tenderness.  LUNGS and CHEST: Normal breath sounds bilaterally, no wheezing, rales,rhonchi or crepitation. No use of accessory muscles of respiration.  S/p bilateral mastectomy and dressing in place CARDIOVASCULAR: S1, S2 normal. No murmurs, rubs, or gallops.  ABDOMEN: Soft, nontender, nondistended. Bowel sounds present. No organomegaly or mass.  EXTREMITIES: No pedal edema, cyanosis, or clubbing. Has right groin permacath and left groin temp dialysis catheter NEUROLOGIC: Cranial nerves II through XII are intact. Muscle strength 5/5 in all extremities. Sensation intact. Gait not checked. Following commands PSYCHIATRIC: The patient is alert and oriented x 3.  SKIN: No obvious rash, lesion, or ulcer.    LABORATORY PANEL:   CBC  Recent Labs Lab 01/26/16 0418  WBC 4.8  HGB 6.7*  HCT 19.9*  PLT 107*   ------------------------------------------------------------------------------------------------------------------  Chemistries   Recent Labs Lab 01/26/16 0418  NA 138  K 4.3  CL 103  CO2 26  GLUCOSE 92  BUN 40*  CREATININE 7.33*  CALCIUM 9.1  MG 1.8   ------------------------------------------------------------------------------------------------------------------  Cardiac Enzymes No results for input(s): TROPONINI in the last 168 hours. ------------------------------------------------------------------------------------------------------------------  RADIOLOGY:  No results found.  EKG:   Orders placed or performed in visit on 01/18/16  . EKG 12-Lead    ASSESSMENT AND PLAN:   61 year old female with new diagnosis of breast mass diagnosed to be malignant, end-stage renal disease on Monday Wednesday Friday hemodialysis, anemia of chronic disease, history of DVT on Coumadin, hypertension, hyperparathyroidism admitted for bilateral mastectomy but also noted to have her dialysis catheter clotted off.  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 -Management per surgical team. -Pain control recommended.  #2 end-stage renal disease on hemodialysis-the right groin permacath is clotted off, has a left groin temporary dialysis catheter placed. Appreciate nephrology and vascular surgery consults. -for permacath placement today and dialysis after that - Dialysis done wednesday.  #3 anemia of chronic disease-acute on chronic anemia after surgery. Cannot give Epogen due to her diagnosis of cancer. -1 unit packed RBC transfusion given 2 days ago, but hb still at 6.7 - 2 more units today, keep hb >7.5.  #4 history of DVT-due to recent surgery and anemia, continue to hold Coumadin at this time. May be restarted after anemia improves  #5 history of seizure disorder-continue Keppra  #6 hypertension-hold losartan and metoprolol due to hypotension  #7 DVT prophylaxis-on subcutaneous heparin - to coumadin once hb elevated  Physical Therapy once groin catheters are removed.   All the records are reviewed and case discussed with Care Management/Social Workerr. Management plans discussed with the patient, family and they are in agreement.  CODE STATUS: Full Code  TOTAL TIME TAKING CARE OF THIS PATIENT: 33 minutes.   POSSIBLE D/C IN 1-2 DAYS, DEPENDING ON CLINICAL CONDITION.   Gladstone Lighter M.D on 01/26/2016 at 3:55 PM  Between 7am to 6pm - Pager - [phone]  After 6pm go to [email or web address] - password [email or web address] [facility]  Office  [phone]  CC: Primary care physician; Murlean Iba, MD

## 2016-01-26 NOTE — Progress Notes (Signed)
Hemodialysis started

## 2016-01-26 NOTE — Progress Notes (Signed)
[facility]  ROUNDING NOTE   Subjective:   Seen and examined on hemodialysis. Unfortunately, catheter not functioning.  Scheduled for PRBC transfusion.   Objective:  Vital signs in last 24 hours:  Temp:  [97.8 F (36.6 C)-98.8 F (37.1 C)] 98 F (36.7 C) (01/19 0750) Pulse Rate:  [88-105] 101 (01/19 1055) Resp:  [16-24] 18 (01/19 1055) BP: (80-95)/(46-77) 95/77 (01/19 1045) SpO2:  [93 %-99 %] 97 % (01/19 1055)  Weight change:  Filed Weights   01/23/16 0953 01/24/16 1007 01/24/16 1243  Weight: 58.1 kg (128 lb) 60.1 kg (132 lb 8 oz) 59.3 kg (130 lb 11.7 oz)    Intake/Output: I/O last 3 completed shifts: In: 591 [P.O.:657] Out: 295 [Urine:50; Drains:245]   Intake/Output this shift:  No intake/output data recorded.  Physical Exam: General: NAD  Head: Normocephalic, atraumatic. Moist oral mucosal membranes  Eyes: Anicteric, PERRL  Neck: Supple, trachea midline  Lungs:  Clear to auscultation  Heart: Regular rate and rhythm  Abdomen:  Soft, nontender,   Extremities:  no peripheral edema.  Neurologic: Nonfocal, moving all four extremities  Skin: No lesions  Access: Left femoral temp HD catheter, nonfunctioning Right femoral permcath    Basic Metabolic Panel:  Recent Labs Lab 01/22/16 1549 01/23/16 0506 01/24/16 0607 01/24/16 1015 01/26/16 0418  NA 137 139 137 137 138  K 3.9 3.7 5.0 4.4 4.3  CL 102 103 102 102 103  CO2 23 29 25 25 26   GLUCOSE 98 85 95 116* 92  BUN 48* 17 35* 36* 40*  CREATININE 8.63* 4.23* 6.19* 6.66* 7.33*  CALCIUM 10.0 9.3 9.1 8.7* 9.1  MG  --   --   --   --  1.8  PHOS  --   --   --  5.6* 5.8*    Liver Function Tests:  Recent Labs Lab 01/24/16 1015  ALBUMIN 2.6*   No results for input(s): LIPASE, AMYLASE in the last 168 hours. No results for input(s): AMMONIA in the last 168 hours.  CBC:  Recent Labs Lab 01/23/16 0506 01/24/16 0607 01/24/16 1015 01/25/16 0447 01/26/16 0418  WBC 4.9 7.6 6.8 6.5 4.8  HGB  9.0* 7.6* 7.2* 7.7* 6.7*  HCT 25.9* 23.0* 21.3* 22.2* 19.9*  MCV 85.7 85.6 85.5 84.8 85.1  PLT 139* 142* 140* 106* 107*    Cardiac Enzymes: No results for input(s): CKTOTAL, CKMB, CKMBINDEX, TROPONINI in the last 168 hours.  BNP: Invalid input(s): POCBNP  CBG: No results for input(s): GLUCAP in the last 168 hours.  Microbiology: Results for orders placed or performed during the hospital encounter of 01/15/16  Rapid Influenza A&B Antigens ([place] only)     Status: None   Collection Time: 01/15/16  9:37 PM  Result Value Ref Range Status   Influenza A ([place]) NEGATIVE NEGATIVE Final   Influenza B ([facility]) NEGATIVE NEGATIVE Final    Coagulation Studies: No results for input(s): LABPROT, INR in the last 72 hours.  Urinalysis: No results for input(s): COLORURINE, LABSPEC, PHURINE, GLUCOSEU, HGBUR, BILIRUBINUR, KETONESUR, PROTEINUR, UROBILINOGEN, NITRITE, LEUKOCYTESUR in the last 72 hours.  Invalid input(s): APPERANCEUR    Imaging: No results found.   Medications:    . sodium chloride   Intravenous Once  . acetaminophen  650 mg Oral Q6H  .  ceFAZolin (ANCEF) IV  2 g Intravenous On Call to OR  . cyclobenzaprine  10 mg Oral TID  . feeding supplement (ENSURE ENLIVE)  237 mL Oral TID BM  . heparin subcutaneous  5,000  Units Subcutaneous Q8H  . levETIRAcetam  500 mg Oral BID  . pantoprazole  40 mg Oral Daily  . sertraline  100 mg Oral Daily  . sodium chloride flush  3 mL Intravenous Q12H   sodium chloride, ALPRAZolam, benzonatate, HYDROmorphone (DILAUDID) injection, ondansetron **OR** ondansetron (ZOFRAN) IV, oxyCODONE, sodium chloride flush, traMADol, zolpidem  Assessment/ Plan:  Teresa Cook is a 61 y.o. black female  with ESRD on hemodialysis, breast cancer , CHF, depression, DVT, hypertension and seizures.   [place]. / MWF  1. End Stage Renal Disease: with complication of dialysis device. With temp HD catheter left femoral now not functioning.  -  Appreciate vascular input. Will need functioning tunneled catheter. Discussed case with Dr. Delana Meyer  2. Breast Cancer: mastectomy 1/16 Dr. Azalee Course.    3. Hypertension:  Hypotensive. Holding home blood pressure medications currently.  - home regimen of furosemide, metoprolol and losartan.   4. Anemia of chronic kidney disease: hemoglobin 6.7,  status post PRBC transfusion on 1/17.  - Holding epo due to malignacy - PRBC transfusion ordered for today, will plan to give with HD treatment later today  5. Secondary Hyperparathyroidism: with hyperphosphatemia 5.8 - sevelamer with meals - cinacalcet     LOS: 4 Teresa Cook 1/19/201810:59 AM

## 2016-01-26 NOTE — Progress Notes (Addendum)
Notified dr.piscoya of pt b/p this am 82/46. Acknowledged.   Notified dr.piscoya of hemoglobin from 7.7 yesterday to 6.7 this am. Acknowledged and no new orders at this time.

## 2016-01-27 LAB — BASIC METABOLIC PANEL
Anion gap: 6 (ref 5–15)
BUN: 23 mg/dL — AB (ref 6–20)
CHLORIDE: 101 mmol/L (ref 101–111)
CO2: 30 mmol/L (ref 22–32)
Calcium: 9 mg/dL (ref 8.9–10.3)
Creatinine, Ser: 4.37 mg/dL — ABNORMAL HIGH (ref 0.44–1.00)
GFR, EST AFRICAN AMERICAN: 12 mL/min — AB (ref >60)
GFR, EST NON AFRICAN AMERICAN: 10 mL/min — AB (ref >60)
Glucose, Bld: 84 mg/dL (ref 65–99)
POTASSIUM: 4 mmol/L (ref 3.5–5.1)
SODIUM: 137 mmol/L (ref 135–145)

## 2016-01-27 LAB — TYPE AND SCREEN
ABO/RH(D): O POS
ANTIBODY SCREEN: POSITIVE
Donor AG Type: NEGATIVE
Donor AG Type: NEGATIVE
Donor AG Type: NEGATIVE
UNIT DIVISION: 0
UNIT DIVISION: 0
Unit division: 0

## 2016-01-27 LAB — CBC
HCT: 26 % — ABNORMAL LOW (ref 35.0–47.0)
Hemoglobin: 8.9 g/dL — ABNORMAL LOW (ref 12.0–16.0)
MCH: 28.8 pg (ref 26.0–34.0)
MCHC: 34.1 g/dL (ref 32.0–36.0)
MCV: 84.4 fL (ref 80.0–100.0)
PLATELETS: 101 10*3/uL — AB (ref 150–440)
RBC: 3.08 MIL/uL — AB (ref 3.80–5.20)
RDW: 16.1 % — ABNORMAL HIGH (ref 11.5–14.5)
WBC: 4.4 10*3/uL (ref 3.6–11.0)

## 2016-01-27 LAB — PROTIME-INR
INR: 1.18
PROTHROMBIN TIME: 15.1 s (ref 11.4–15.2)

## 2016-01-27 MED ORDER — WARFARIN SODIUM 5 MG PO TABS
5.0000 mg | ORAL_TABLET | Freq: Once | ORAL | Status: AC
  Administered 2016-01-27: 5 mg via ORAL
  Filled 2016-01-27: qty 1

## 2016-01-27 MED ORDER — WARFARIN SODIUM 5 MG PO TABS: 7.5000 mg | ORAL_TABLET | Freq: Once | ORAL | Status: DC

## 2016-01-27 NOTE — Progress Notes (Signed)
ANTICOAGULATION CONSULT NOTE - Initial Consult  Pharmacy Consult for Warfarin Indication:History of  DVT  Allergies  Allergen Reactions  . Gabapentin Other (See Comments)    Seizure  . Adhesive [Tape] Itching    Silk tape is ok to use.    Patient Measurements: Height: 5\' 6"  (167.6 cm) Weight: 130 lb 11.7 oz (59.3 kg) IBW/kg (Calculated) : 59.3  Vital Signs: Temp: 98.6 F (37 C) (01/20 0844) Temp Source: Oral (01/20 0844) BP: 98/59 (01/20 0844) Pulse Rate: 95 (01/20 0844)  Labs:  Recent Labs  01/25/16 0447 01/26/16 0418 01/26/16 2016 01/27/16 0453  HGB 7.7* 6.7*  --  8.9*  HCT 22.2* 19.9*  --  26.0*  PLT 106* 107*  --  101*  LABPROT  --   --  15.1 15.1  INR  --   --  1.18 1.18  CREATININE  --  7.33*  --  4.37*    Estimated Creatinine Clearance: 12.5 mL/min (by C-G formula based on SCr of 4.37 mg/dL (H)).   Medical History: Past Medical History:  Diagnosis Date  . Anemia   . Anxiety   . Breast cancer ([place]) 01/2016   bilateral  . CHF (congestive heart failure) ([place])   . Chronic kidney disease   . Depression   . Dialysis patient ([place])   . DVT (deep venous thrombosis) ([facility])    left leg  . DVT (deep venous thrombosis) ([place]) 1985   right thigh  . Dysrhythmia   . Gout   . Headache   . HTN (hypertension)   . Hypertension   . Parathyroid abnormality ([place])   . Parathyroid disease ([place])   . Pneumonia 12/2015  . Psoriasis   . Renal insufficiency   . Sickle cell trait ([facility])    traits    Assessment: 61 yo female with PMH of DVT. Pharmacy consulted for warfarin dosing. Patient was taking warfarin prior to admission. INR on admission on 1/15 was 1.47 (held for surgery). Patient has not had any warfarin during hospital stay.   1/19  INR 1.18 (@2016 )   7.5 mg 1/20  INR 1.18  HOME REGIMEN:   Warfarin 7.5mg : Mon, Tues, Weds, Thurs                                    Warfarin 5mg  Fri, Sat, Sun   Goal of Therapy:  INR 2-3 Monitor platelets by anticoagulation  protocol: Yes   Plan:  Will order Warfarin 5 mg x 1 tonight. Monitor H & H. Patient also on heparin SubQ while INR subtherapeutic.  Will D/C once INR within goal.  INR daily until therapeutic.    Noralee Space, PharmD, BCPS Clinical Pharmacist 01/27/2016 12:06 PM

## 2016-01-27 NOTE — Progress Notes (Signed)
61 yr old female with multiple medical issues POD#4 from Bilateral mastectomy with bilateral axillary node biopies and forehead lesion excision.  Patient overall doing well.   She states pain well controlled but increased pain on the right.    Vitals:   01/27/16 0844 01/27/16 1310  BP: (!) 98/59 (!) 92/58  Pulse: 95 94  Resp: 14 16  Temp: 98.6 F (37 C) 99 F (37.2 C)   I/O last 3 completed shifts: In: 940 [P.O.:240; Blood:700] Out: 65 [Urine:100; Drains:145; Other:650] Total I/O In: 267.5 [P.O.:240; Other:27.5] Out: 110 [Drains:110]   PE: Gen: NAD Forehead: incision c/d/i with sterile glue in place Left mastectomy site: incision c/d/i minimal bruising to inferior portion in spots, JP drains serosanguineous  Right mastectomy site: incision c/d/i, no ecchymosis or signs of infection, some fluid collecting in inferior and axillary area, compressed and stripped JP drains serosanguinous 150 out of JP #1, minimal from JP #2   A/P:  61 yr old female with multiple medical issues POD#4 from Bilateral mastectomy with bilateral axillary node biopies and forehead lesion excision.   Pain: continue scheduled tylenol, toradol, flexeril, with prn tramadol, oxycodone and diluadid iv as needed ESRD: Left IJ perm cath placed today, supposed to be dialyzed tonight since unable with temp cath earlier today, to get blood with dialysis   Breast Cancer:  Spoke with her about exercises to keep ROM 3 times daily, will continue JP drains at home, added additional fluffs for compression to right side and made tighter, may need home health to help reinforce teaching

## 2016-01-27 NOTE — Progress Notes (Signed)
[facility]  ROUNDING NOTE   Subjective:   Hemodialysis treatment yesterday. LIJ permcath placed yesterday. Uf goal of 650  2 units PRBC yesterday.   Patient states her pain in 7/10  Objective:  Vital signs in last 24 hours:  Temp:  [97.8 F (36.6 C)-98.6 F (37 C)] 98.6 F (37 C) (01/20 0844) Pulse Rate:  [80-102] 95 (01/20 0844) Resp:  [14-27] 14 (01/20 0844) BP: (98-144)/(59-92) 98/59 (01/20 0844) SpO2:  [95 %-100 %] 98 % (01/20 0844)  Weight change:  Filed Weights   01/23/16 0953 01/24/16 1007 01/24/16 1243  Weight: 58.1 kg (128 lb) 60.1 kg (132 lb 8 oz) 59.3 kg (130 lb 11.7 oz)    Intake/Output: I/O last 3 completed shifts: In: 940 [P.O.:240; Blood:700] Out: 66 [Urine:100; Drains:145; Other:650]   Intake/Output this shift:  Total I/O In: 240 [P.O.:240] Out: -   Physical Exam: General: NAD  Head: Normocephalic, atraumatic. Moist oral mucosal membranes  Eyes: Anicteric, PERRL  Neck: Supple, trachea midline  Lungs:  Clear to auscultation  Heart: Regular rate and rhythm  Abdomen:  Soft, nontender,   Extremities:  no peripheral edema.  Neurologic: Nonfocal, moving all four extremities  Skin: No lesions  Access: LIJ permcath cath 1/19 Dr. Delana Meyer    Basic Metabolic Panel:  Recent Labs Lab 01/23/16 0506 01/24/16 0607 01/24/16 1015 01/26/16 0418 01/27/16 0453  NA 139 137 137 138 137  K 3.7 5.0 4.4 4.3 4.0  CL 103 102 102 103 101  CO2 29 25 25 26 30   GLUCOSE 85 95 116* 92 84  BUN 17 35* 36* 40* 23*  CREATININE 4.23* 6.19* 6.66* 7.33* 4.37*  CALCIUM 9.3 9.1 8.7* 9.1 9.0  MG  --   --   --  1.8  --   PHOS  --   --  5.6* 5.8*  --     Liver Function Tests:  Recent Labs Lab 01/24/16 1015  ALBUMIN 2.6*   No results for input(s): LIPASE, AMYLASE in the last 168 hours. No results for input(s): AMMONIA in the last 168 hours.  CBC:  Recent Labs Lab 01/24/16 0607 01/24/16 1015 01/25/16 0447 01/26/16 0418 01/27/16 0453  WBC 7.6  6.8 6.5 4.8 4.4  HGB 7.6* 7.2* 7.7* 6.7* 8.9*  HCT 23.0* 21.3* 22.2* 19.9* 26.0*  MCV 85.6 85.5 84.8 85.1 84.4  PLT 142* 140* 106* 107* 101*    Cardiac Enzymes: No results for input(s): CKTOTAL, CKMB, CKMBINDEX, TROPONINI in the last 168 hours.  BNP: Invalid input(s): POCBNP  CBG: No results for input(s): GLUCAP in the last 168 hours.  Microbiology: Results for orders placed or performed during the hospital encounter of 01/15/16  Rapid Influenza A&B Antigens ([place] only)     Status: None   Collection Time: 01/15/16  9:37 PM  Result Value Ref Range Status   Influenza A ([place]) NEGATIVE NEGATIVE Final   Influenza B ([facility]) NEGATIVE NEGATIVE Final    Coagulation Studies:  Recent Labs  01/26/16 2016 01/27/16 0453  LABPROT 15.1 15.1  INR 1.18 1.18    Urinalysis: No results for input(s): COLORURINE, LABSPEC, PHURINE, GLUCOSEU, HGBUR, BILIRUBINUR, KETONESUR, PROTEINUR, UROBILINOGEN, NITRITE, LEUKOCYTESUR in the last 72 hours.  Invalid input(s): APPERANCEUR    Imaging: No results found.   Medications:    . sodium chloride   Intravenous Once  . acetaminophen  650 mg Oral Q6H  . cyclobenzaprine  10 mg Oral TID  . feeding supplement (ENSURE ENLIVE)  237 mL Oral TID BM  .  heparin subcutaneous  5,000 Units Subcutaneous Q8H  . levETIRAcetam  500 mg Oral BID  . pantoprazole  40 mg Oral Daily  . sertraline  100 mg Oral Daily  . sodium chloride flush  3 mL Intravenous Q12H  . Warfarin - Pharmacist Dosing Inpatient   Does not apply q1800   sodium chloride, ALPRAZolam, benzonatate, HYDROmorphone (DILAUDID) injection, ondansetron **OR** ondansetron (ZOFRAN) IV, oxyCODONE, sodium chloride flush, traMADol, zolpidem  Assessment/ Plan:  Teresa Cook is a 61 y.o. black female  with ESRD on hemodialysis, breast cancer , CHF, depression, DVT, hypertension and seizures.   [place]. / MWF  1. End Stage Renal Disease: with complication of dialysis device. With  temp HD catheter removed and now LIJ permcath placed and functioned well.   2. Breast Cancer: mastectomy 1/16 Dr. Azalee Course.    3. Hypertension:  Hypotensive. Holding home blood pressure medications currently.  - home regimen of furosemide, metoprolol and losartan.   4. Anemia of chronic kidney disease: hemoglobin 8.9, status post PRBC transfusion on 1/17 and 1/19 - Holding epo due to malignacy  5. Secondary Hyperparathyroidism: with hyperphosphatemia 5.8 - sevelamer with meals - cinacalcet     LOS: 5 Teresa Cook 1/20/201811:52 AM

## 2016-01-28 LAB — CBC
HCT: 26 % — ABNORMAL LOW (ref 35.0–47.0)
Hemoglobin: 8.9 g/dL — ABNORMAL LOW (ref 12.0–16.0)
MCH: 29.2 pg (ref 26.0–34.0)
MCHC: 34.3 g/dL (ref 32.0–36.0)
MCV: 85.2 fL (ref 80.0–100.0)
PLATELETS: 106 10*3/uL — AB (ref 150–440)
RBC: 3.05 MIL/uL — AB (ref 3.80–5.20)
RDW: 16.5 % — ABNORMAL HIGH (ref 11.5–14.5)
WBC: 4.5 10*3/uL (ref 3.6–11.0)

## 2016-01-28 LAB — PROTIME-INR
INR: 1.18
PROTHROMBIN TIME: 15.1 s (ref 11.4–15.2)

## 2016-01-28 MED ORDER — WARFARIN SODIUM 5 MG PO TABS
7.5000 mg | ORAL_TABLET | Freq: Once | ORAL | Status: AC
  Administered 2016-01-28: 7.5 mg via ORAL
  Filled 2016-01-28: qty 2

## 2016-01-28 NOTE — Progress Notes (Signed)
61 yr old female with multiple medical issues POD#5 from Bilateral mastectomy with bilateral axillary node biopies and forehead lesion excision.  Patient overall doing well.  She is anxious about her drain on the left with some clot in it.  She states pain well controlled and overall improving.   Vitals:   01/28/16 0818 01/28/16 1356  BP: 96/61 107/69  Pulse: 91 (!) 105  Resp: 16   Temp: 98.4 F (36.9 C) 98.3 F (36.8 C)   I/O last 3 completed shifts: In: 1204.5 [P.O.:477; Blood:700; Other:27.5] Out: 1490 [Urine:100; Drains:240; Other:1150] Total I/O In: 66 [P.O.:720] Out: 122.5 [Urine:75; Drains:47.5]   PE: Gen: NAD Forehead: incision c/d/i with sterile glue in place Left mastectomy site: incision c/d/i minimal bruising to inferior portion in spots, JP drains serosanguineous, minimal clot removed from the drain #3 but otherwise minimal mainly serous drainage Right mastectomy site: incision c/d/i, no ecchymosis or signs of infection, fluid resolved that was collecting yesterday, JP drains serosanguinous    A/P:  61 yr old female with multiple medical issues POD#5 from Bilateral mastectomy with bilateral axillary node biopies and forehead lesion excision.   Pain: continue scheduled tylenol, toradol, flexeril, with prn tramadol, oxycodone and diluadid iv as needed ESRD: Left IJ perm cath working well, diaylses MWF Breast Cancer:  Spoke with her about exercises to keep ROM 3 times daily, will continue JP drains at home patient and daughter getting teaching today.  Patient was uncomfortable going home today but is comfortable with going home after she receives teaching.   Anticoagulation:  Coumadin restarted, heparin TID only while she is here, getting additional heparin at dialysis to bridge, will continue with that as an outpatient  Likely D/c home tomorrow after dialysis

## 2016-01-28 NOTE — Progress Notes (Signed)
[facility]  ROUNDING NOTE   Subjective:   Continues to complain of pain around drain sites.   Objective:  Vital signs in last 24 hours:  Temp:  [97.9 F (36.6 C)-99 F (37.2 C)] 98.4 F (36.9 C) (01/21 0818) Pulse Rate:  [91-104] 91 (01/21 0818) Resp:  [16-20] 16 (01/21 0818) BP: (92-103)/(58-71) 96/61 (01/21 0818) SpO2:  [96 %-99 %] 99 % (01/21 0818)  Weight change:  Filed Weights   01/23/16 0953 01/24/16 1007 01/24/16 1243  Weight: 58.1 kg (128 lb) 60.1 kg (132 lb 8 oz) 59.3 kg (130 lb 11.7 oz)    Intake/Output: I/O last 3 completed shifts: In: 1204.5 [P.O.:477; Blood:700; Other:27.5] Out: 1490 [Urine:100; Drains:240; Other:1150]   Intake/Output this shift:  Total I/O In: 480 [P.O.:480] Out: 95 [Urine:75; Drains:20]  Physical Exam: General: NAD  Head: Normocephalic, atraumatic. Moist oral mucosal membranes  Eyes: Anicteric, PERRL  Neck: Supple, trachea midline  Chest Dressings with blood and JP drains  Lungs:  Clear to auscultation  Heart: Regular rate and rhythm  Abdomen:  Soft, nontender,   Extremities:  no peripheral edema.  Neurologic: Nonfocal, moving all four extremities  Skin: No lesions  Access: LIJ permcath cath 1/19 Dr. Delana Meyer    Basic Metabolic Panel:  Recent Labs Lab 01/23/16 0506 01/24/16 0607 01/24/16 1015 01/26/16 0418 01/27/16 0453  NA 139 137 137 138 137  K 3.7 5.0 4.4 4.3 4.0  CL 103 102 102 103 101  CO2 29 25 25 26 30   GLUCOSE 85 95 116* 92 84  BUN 17 35* 36* 40* 23*  CREATININE 4.23* 6.19* 6.66* 7.33* 4.37*  CALCIUM 9.3 9.1 8.7* 9.1 9.0  MG  --   --   --  1.8  --   PHOS  --   --  5.6* 5.8*  --     Liver Function Tests:  Recent Labs Lab 01/24/16 1015  ALBUMIN 2.6*   No results for input(s): LIPASE, AMYLASE in the last 168 hours. No results for input(s): AMMONIA in the last 168 hours.  CBC:  Recent Labs Lab 01/24/16 1015 01/25/16 0447 01/26/16 0418 01/27/16 0453 01/28/16 0413  WBC 6.8 6.5 4.8  4.4 4.5  HGB 7.2* 7.7* 6.7* 8.9* 8.9*  HCT 21.3* 22.2* 19.9* 26.0* 26.0*  MCV 85.5 84.8 85.1 84.4 85.2  PLT 140* 106* 107* 101* 106*    Cardiac Enzymes: No results for input(s): CKTOTAL, CKMB, CKMBINDEX, TROPONINI in the last 168 hours.  BNP: Invalid input(s): POCBNP  CBG: No results for input(s): GLUCAP in the last 168 hours.  Microbiology: Results for orders placed or performed during the hospital encounter of 01/15/16  Rapid Influenza A&B Antigens ([place] only)     Status: None   Collection Time: 01/15/16  9:37 PM  Result Value Ref Range Status   Influenza A [facility]) NEGATIVE NEGATIVE Final   Influenza B ([facility]) NEGATIVE NEGATIVE Final    Coagulation Studies:  Recent Labs  01/26/16 2016 01/27/16 0453 01/28/16 0413  LABPROT 15.1 15.1 15.1  INR 1.18 1.18 1.18    Urinalysis: No results for input(s): COLORURINE, LABSPEC, PHURINE, GLUCOSEU, HGBUR, BILIRUBINUR, KETONESUR, PROTEINUR, UROBILINOGEN, NITRITE, LEUKOCYTESUR in the last 72 hours.  Invalid input(s): APPERANCEUR    Imaging: No results found.   Medications:    . sodium chloride   Intravenous Once  . acetaminophen  650 mg Oral Q6H  . cyclobenzaprine  10 mg Oral TID  . feeding supplement (ENSURE ENLIVE)  237 mL Oral TID BM  .  heparin subcutaneous  5,000 Units Subcutaneous Q8H  . levETIRAcetam  500 mg Oral BID  . pantoprazole  40 mg Oral Daily  . sertraline  100 mg Oral Daily  . sodium chloride flush  3 mL Intravenous Q12H  . Warfarin - Pharmacist Dosing Inpatient   Does not apply q1800   sodium chloride, ALPRAZolam, benzonatate, HYDROmorphone (DILAUDID) injection, ondansetron **OR** ondansetron (ZOFRAN) IV, oxyCODONE, sodium chloride flush, traMADol, zolpidem  Assessment/ Plan:  [name] is a 61 y.o. black female  with ESRD on hemodialysis, breast cancer , CHF, depression, DVT, hypertension and seizures.   [place]. / MWF  1. End Stage Renal Disease: with complication of  dialysis device. With temp HD catheter removed and now LIJ permcath placed and functioned well. Last dialysis was Friday. Next treatment for tomorrow.   2. Breast Cancer: mastectomy 1/16 Dr. Azalee Course.    3. Hypertension:  Hypotensive. Holding home blood pressure medications currently.  - home regimen of furosemide, metoprolol and losartan.   4. Anemia of chronic kidney disease: hemoglobin 8.9, status post PRBC transfusion on 1/17 and 1/19 - Holding epo due to malignacy  5. Secondary Hyperparathyroidism: with hyperphosphatemia 5.8 - sevelamer with meals - cinacalcet     LOS: [place], [place] 1/21/201811:44 AM

## 2016-01-28 NOTE — Progress Notes (Signed)
ANTICOAGULATION CONSULT NOTE - Initial Consult  Pharmacy Consult for Warfarin Indication:History of  DVT  Allergies  Allergen Reactions  . Gabapentin Other (See Comments)    Seizure  . Adhesive [Tape] Itching    Silk tape is ok to use.    Patient Measurements: Height: 5\' 6"  (167.6 cm) Weight: 130 lb 11.7 oz (59.3 kg) IBW/kg (Calculated) : 59.3  Vital Signs: Temp: 98.4 F (36.9 C) (01/21 0818) Temp Source: Oral (01/21 0818) BP: 96/61 (01/21 0818) Pulse Rate: 91 (01/21 0818)  Labs:  Recent Labs  01/26/16 0418 01/26/16 2016 01/27/16 0453 01/28/16 0413  HGB 6.7*  --  8.9* 8.9*  HCT 19.9*  --  26.0* 26.0*  PLT 107*  --  101* 106*  LABPROT  --  15.1 15.1 15.1  INR  --  1.18 1.18 1.18  CREATININE 7.33*  --  4.37*  --     Estimated Creatinine Clearance: 12.5 mL/min (by C-G formula based on SCr of 4.37 mg/dL (H)).   Medical History: Past Medical History:  Diagnosis Date  . Anemia   . Anxiety   . Breast cancer ([place]) 01/2016   bilateral  . CHF (congestive heart failure) ([place])   . Chronic kidney disease   . Depression   . Dialysis patient ([place])   . DVT (deep venous thrombosis) (H[facility])    left leg  . DVT (deep venous thrombosis) ([place]) 1985   right thigh  . Dysrhythmia   . Gout   . Headache   . HTN (hypertension)   . Hypertension   . Parathyroid abnormality ([place])   . Parathyroid disease ([place])   . Pneumonia 12/2015  . Psoriasis   . Renal insufficiency   . Sickle cell trait (HCC)    traits    Assessment: 61 yo female with PMH of DVT. Pharmacy consulted for warfarin dosing. Patient was taking warfarin prior to admission. INR on admission on 1/15 was 1.47 (held for surgery). Patient has not had any warfarin during hospital stay.   1/19  INR 1.18 (@2016 )   7.5 mg 1/20  INR 1.18   5 mg 1/21  INR 1.18    HOME REGIMEN:   Warfarin 7.5mg : Mon, Tues, Weds, Thurs                                    Warfarin 5mg  Fri, Sat, Sun   Goal of Therapy:  INR 2-3 Monitor  platelets by anticoagulation protocol: Yes   Plan:  Will order Warfarin 7.5 mg x 1 tonight. Monitor H & H. Patient also on heparin SubQ while INR subtherapeutic.  Will D/C once INR within goal.  INR daily until therapeutic.    Noralee Space, PharmD, BCPS Clinical Pharmacist 01/28/2016 12:07 PM

## 2016-01-29 LAB — CBC
HEMATOCRIT: 25.3 % — AB (ref 35.0–47.0)
Hemoglobin: 8.7 g/dL — ABNORMAL LOW (ref 12.0–16.0)
MCH: 29 pg (ref 26.0–34.0)
MCHC: 34.2 g/dL (ref 32.0–36.0)
MCV: 84.8 fL (ref 80.0–100.0)
Platelets: 108 10*3/uL — ABNORMAL LOW (ref 150–440)
RBC: 2.98 MIL/uL — AB (ref 3.80–5.20)
RDW: 16.3 % — ABNORMAL HIGH (ref 11.5–14.5)
WBC: 4 10*3/uL (ref 3.6–11.0)

## 2016-01-29 LAB — RENAL FUNCTION PANEL
Albumin: 2.6 g/dL — ABNORMAL LOW (ref 3.5–5.0)
Anion gap: 11 (ref 5–15)
BUN: 63 mg/dL — ABNORMAL HIGH (ref 6–20)
CHLORIDE: 93 mmol/L — AB (ref 101–111)
CO2: 28 mmol/L (ref 22–32)
Calcium: 9.1 mg/dL (ref 8.9–10.3)
Creatinine, Ser: 7.72 mg/dL — ABNORMAL HIGH (ref 0.44–1.00)
GFR, EST AFRICAN AMERICAN: 6 mL/min — AB (ref >60)
GFR, EST NON AFRICAN AMERICAN: 5 mL/min — AB (ref >60)
Glucose, Bld: 109 mg/dL — ABNORMAL HIGH (ref 65–99)
POTASSIUM: 5.4 mmol/L — AB (ref 3.5–5.1)
Phosphorus: 6.3 mg/dL — ABNORMAL HIGH (ref 2.5–4.6)
Sodium: 132 mmol/L — ABNORMAL LOW (ref 135–145)

## 2016-01-29 LAB — PROTIME-INR
INR: 1.22
Prothrombin Time: 15.5 seconds — ABNORMAL HIGH (ref 11.4–15.2)

## 2016-01-29 MED ORDER — WARFARIN SODIUM 5 MG PO TABS: 9.0000 mg | ORAL_TABLET | Freq: Once | ORAL | Status: DC

## 2016-01-29 MED ORDER — OXYCODONE HCL 10 MG PO TABS: 10.0000 mg | ORAL_TABLET | ORAL | 0 refills | Status: DC | PRN

## 2016-01-29 NOTE — Progress Notes (Signed)
ANTICOAGULATION CONSULT NOTE - Initial Consult  Pharmacy Consult for Warfarin Indication:History of  DVT  Allergies  Allergen Reactions  . Gabapentin Other (See Comments)    Seizure  . Adhesive [Tape] Itching    Silk tape is ok to use.   Patient Measurements: Height: 5\' 6"  (167.6 cm) Weight: 130 lb 11.7 oz (59.3 kg) IBW/kg (Calculated) : 59.3  Vital Signs: Temp: 97.9 F (36.6 C) (01/22 0447) Temp Source: Oral (01/22 0447) BP: 95/56 (01/22 0447) Pulse Rate: 86 (01/22 0447)  Labs:  Recent Labs  01/27/16 0453 01/28/16 0413 01/29/16 0520  HGB 8.9* 8.9*  --   HCT 26.0* 26.0*  --   PLT 101* 106*  --   LABPROT 15.1 15.1 15.5*  INR 1.18 1.18 1.22  CREATININE 4.37*  --   --     Estimated Creatinine Clearance: 12.5 mL/min (by C-G formula based on SCr of 4.37 mg/dL (H)).   Medical History: Past Medical History:  Diagnosis Date  . Anemia   . Anxiety   . Breast cancer ([place]) 01/2016   bilateral  . CHF (congestive heart failure) ([place])   . Chronic kidney disease   . Depression   . Dialysis patient ([place])   . DVT (deep venous thrombosis) (H[facility])    left leg  . DVT (deep venous thrombosis) ([place]) 1985   right thigh  . Dysrhythmia   . Gout   . Headache   . HTN (hypertension)   . Hypertension   . Parathyroid abnormality ([place])   . Parathyroid disease ([place])   . Pneumonia 12/2015  . Psoriasis   . Renal insufficiency   . Sickle cell trait (HCC)    traits    Assessment: 61 yo female with PMH of DVT. Pharmacy consulted for warfarin dosing. Patient was taking warfarin prior to admission. INR on admission on 1/15 was 1.47 (held for surgery). Patient has not had any warfarin during hospital stay.   Dosing history: Warfarin held from 1/15 - 1/18 post-op  Date INR Dose 1/19 1.18 7.5 mg 1/20 1.18 5 mg 1/21 1.18 7.5 mg 1/22 1.22  HOME REGIMEN:   Warfarin 7.5mg : Mon, Tues, Weds, Thurs                                    Warfarin 5mg  Fri, Sat, Sun   Goal of Therapy:  INR  2-3 Monitor platelets by anticoagulation protocol: Yes   Plan:  Will order warfarin 9 mg x 1 tonight and give slightly higher dose to help boost INR. INR remains subtherapeutic as expected as warfarin was held for several days. Monitor H & H. Patient also on heparin SubQ while INR subtherapeutic.  Will D/C subcutaneous heparin once INR within goal.  Will check INR daily until therapeutic.   Lenis Noon, PharmD, BCPS Clinical Pharmacist 01/29/2016 7:47 AM

## 2016-01-29 NOTE — Progress Notes (Signed)
Pre dialysis  

## 2016-01-29 NOTE — Progress Notes (Deleted)
ANTICOAGULATION CONSULT NOTE - Initial Consult  Pharmacy Consult for Warfarin Indication:History of  DVT  Allergies  Allergen Reactions  . Gabapentin Other (See Comments)    Seizure  . Adhesive [Tape] Itching    Silk tape is ok to use.    Patient Measurements: Height: 5\' 6"  (167.6 cm) Weight: 130 lb 11.7 oz (59.3 kg) IBW/kg (Calculated) : 59.3  Vital Signs: Temp: 97.6 F (36.4 C) (01/22 0748) Temp Source: Oral (01/22 0447) BP: 98/56 (01/22 0748) Pulse Rate: 84 (01/22 0748)  Labs:  Recent Labs  01/27/16 0453 01/28/16 0413 01/29/16 0520  HGB 8.9* 8.9*  --   HCT 26.0* 26.0*  --   PLT 101* 106*  --   LABPROT 15.1 15.1 15.5*  INR 1.18 1.18 1.22  CREATININE 4.37*  --   --     Estimated Creatinine Clearance: 12.5 mL/min (by C-G formula based on SCr of 4.37 mg/dL (H)).   Medical History: Past Medical History:  Diagnosis Date  . Anemia   . Anxiety   . Breast cancer ([place]) 01/2016   bilateral  . CHF (congestive heart failure) ([place])   . Chronic kidney disease   . Depression   . Dialysis patient ([place])   . DVT (deep venous thrombosis) (H[facility])    left leg  . DVT (deep venous thrombosis) ([place]) 1985   right thigh  . Dysrhythmia   . Gout   . Headache   . HTN (hypertension)   . Hypertension   . Parathyroid abnormality ([place])   . Parathyroid disease ([place])   . Pneumonia 12/2015  . Psoriasis   . Renal insufficiency   . Sickle cell trait ([facility])    traits    Assessment: 61 yo female with PMH of DVT. Pharmacy consulted for warfarin dosing. Patient was taking warfarin prior to admission. INR on admission on 1/15 was 1.47 (held for surgery). Patient has not had any warfarin during hospital stay.   1/19  INR 1.18 (@2016 )   7.5 mg 1/20  INR 1.18   5 mg 1/21  INR 1.18; 7.5 mg 1/22: INR: 1.22   HOME REGIMEN:   Warfarin 7.5mg : Mon, Tues, Weds, Thurs                                    Warfarin 5mg  Fri, Sat, Sun   Goal of Therapy:  INR 2-3 Monitor platelets by  anticoagulation protocol: Yes   Plan:  Will give Warfarin 7.5 mg x 1 tonight. Monitor H & H. Patient also on heparin SubQ while INR subtherapeutic.  Will D/C once INR within goal.  INR daily until therapeutic.    Larene Beach, PharmD, BCPS Clinical Pharmacist 01/29/2016 7:51 AM

## 2016-01-29 NOTE — Progress Notes (Signed)
3 Days Post-Op   Subjective:  Patient reports continued discomfort bilateral chest sites. However she also states that she feels ready to go home.  Vital signs in last 24 hours: Temp:  [97.6 F (36.4 C)-98.3 F (36.8 C)] 97.8 F (36.6 C) (01/22 0938) Pulse Rate:  [84-105] 94 (01/22 0938) Resp:  [15-18] 15 (01/22 0938) BP: (95-120)/(56-74) 106/72 (01/22 0941) SpO2:  [92 %-100 %] 93 % (01/22 0941) Weight:  [61.2 kg (135 lb)] 61.2 kg (135 lb) (01/22 0938) Last BM Date: 01/26/16 (pt declines intervention)  Intake/Output from previous day: 01/21 0701 - 01/22 0700 In: 720 [P.O.:720] Out: 172.5 [Urine:75; Drains:97.5]  Physical exam: Gen.: No acute distress Chest: Clear to auscultation Breast: Bilateral breast surgically absent with 2 JP drains to each side. Draining a serous fluid to all 4 drain sites. Ecchymosis present bilaterally left greater than right. No evidence of erythema or purulence. Heart: Regular rate and rhythm GI: soft, non-tender; bowel sounds normal; no masses,  no organomegaly  Lab Results:  CBC  Recent Labs  01/28/16 0413 01/29/16 0941  WBC 4.5 4.0  HGB 8.9* 8.7*  HCT 26.0* 25.3*  PLT 106* 108*   CMP     Component Value Date/Time   NA 137 01/27/2016 0453   NA 138 02/03/2014 0135   K 4.0 01/27/2016 0453   K 4.0 02/03/2014 0135   CL 101 01/27/2016 0453   CL 102 02/03/2014 0135   CO2 30 01/27/2016 0453   CO2 23 02/03/2014 0135   GLUCOSE 84 01/27/2016 0453   GLUCOSE 101 (H) 02/03/2014 0135   BUN 23 (H) 01/27/2016 0453   BUN 21 (H) 02/03/2014 0135   CREATININE 4.37 (H) 01/27/2016 0453   CREATININE 5.11 (H) 02/03/2014 0135   CALCIUM 9.0 01/27/2016 0453   CALCIUM 8.6 02/03/2014 0135   PROT 6.7 01/15/2016 2106   PROT 7.4 02/02/2014 2036   ALBUMIN 2.6 (L) 01/24/2016 1015   ALBUMIN 3.5 02/02/2014 2036   AST 25 01/15/2016 2106   AST 22 02/02/2014 2036   ALT 23 01/15/2016 2106   ALT 20 02/02/2014 2036   ALKPHOS 225 (H) 01/15/2016 2106   ALKPHOS  185 (H) 02/02/2014 2036   BILITOT 0.5 01/15/2016 2106   BILITOT 0.3 02/02/2014 2036   GFRNONAA 10 (L) 01/27/2016 0453   GFRNONAA 9 (L) 02/03/2014 0135   GFRNONAA 6 (L) 09/08/2013 0133   GFRAA 12 (L) 01/27/2016 0453   GFRAA 11 (L) 02/03/2014 0135   GFRAA 7 (L) 09/08/2013 0133   PT/INR  Recent Labs  01/28/16 0413 01/29/16 0520  LABPROT 15.1 15.5*  INR 1.18 1.22    Studies/Results: No results found.  Assessment/Plan: 61 year old female status post bilateral mastectomy. Doing well. Discussed discharge planning with the patient who states she thinks she'll be ready to go home after dialysis today. We will revisit after dialysis and likely discharge her home at that time.   Clayburn Pert, MD FACS General Surgeon  01/29/2016

## 2016-01-29 NOTE — Discharge Instructions (Signed)
Total or Modified Radical Mastectomy, Care After °Refer to this sheet in the next few weeks. These instructions provide you with information about caring for yourself after your procedure. Your health care provider may also give you more specific instructions. Your treatment has been planned according to current medical practices, but problems sometimes occur. Call your health care provider if you have any problems or questions after your procedure. °What can I expect after the procedure? °After your procedure, it is common to have: °· Pain. °· Numbness. °· Stiffness in your arm or shoulder. °· Feelings of stress, sadness, or depression. °If the lymph nodes under your arm were removed, you may have arm swelling, weakness, or numbness on the same side of your body as your surgery. °Follow these instructions at home: °Incision care  °· There are many different ways to close and cover an incision, including stitches, skin glue, and adhesive strips. Follow your health care provider's instructions about: °¨ Incision care. °¨ Bandage (dressing) changes and removal. °¨ Incision closure removal. °· Check your incision area every day for signs of infection. Watch for: °¨ Redness, swelling, or pain. °¨ Fluid, blood, or pus. °· If you were sent home with a surgical drain in place, follow your health care provider's instructions for emptying it. °Bathing  °· Do not take baths, swim, or use a hot tub until your health care provider approves. °· Take sponge baths until your health care provider says that you can start showering or bathing. °Activity  °· Return to your normal activities as directed by your health care provider. °· Avoid strenuous exercise. °· Be careful to avoid any activities that could cause an injury to your arm on the side of your surgery. °· Do not lift anything that is heavier than 10 lb (4.5 kg). Avoid lifting with the arm that is on the side of your surgery. °· Do not carry heavy objects on your  shoulder. °· After your drain is removed, you should perform exercises to keep your arm from getting stiff and swollen. Talk with your health care provider about which exercises are safe for you. °General instructions  °· Take medicines only as directed by your health care provider. °· You may eat what you usually do. °· Keep your arm elevated when at rest. °· Do not wear tight jewelry on your arm, wrist, or fingers on the side of your surgery. °· Get checked for extra fluid around your lymph nodes (lymphedema) as often as told by your health care provider. °· If you had a modified radical mastectomy, always let your health care providers know that lymph nodes under your arm were removed. This is important information to share before you are involved in certain procedures, such as giving blood or having your blood pressure taken. °Contact a health care provider if: °· You have a fever. °· Your pain medicine is not working. °· Your arm swelling, weakness, or numbness has not improved after a few weeks. °· You have new swelling in your breast or arm. °· You have redness, swelling, or pain in your incision area. °· You have fluid, blood, or pus coming from your incision. °Get help right away if: °· You have very bad pain in your breast or arm. °· You have chest pain. °· You have difficulty breathing. °This information is not intended to replace advice given to you by your health care provider. Make sure you discuss any questions you have with your health care provider. °Document Released: 08/17/2003   Document Revised: 08/31/2015 Document Reviewed: 09/08/2013 °Elsevier Interactive Patient Education © 2017 [employer] Inc. ° °

## 2016-01-29 NOTE — Progress Notes (Signed)
HD initiated without issue via L Chest HD cath. Venous port sluggish to pull but flushes well. No heparin treatment. Cbc/renal sent to lab per order.

## 2016-01-29 NOTE — Progress Notes (Signed)
HD completed without issue.

## 2016-01-29 NOTE — Care Management Important Message (Signed)
Important Message  Patient Details  Name: Teresa Cook MRN: [id number] Date of Birth: February 01, 1955   Medicare Important Message Given:  Yes    B[place]ly Sessions, RN 01/29/2016, 10:03 AM

## 2016-01-29 NOTE — Discharge Summary (Signed)
Patient ID: Teresa Cook MRN: [id number] DOB/AGE: Mar 31, 1955 61 y.o.  Admit date: 01/22/2016 Discharge date: 01/29/2016  Discharge Diagnoses:  Breast Cancer  Procedures Performed: Bilateral Mastectomy  Discharged Condition: good  Hospital Course: Patient admitted for bilateral mastectomy. Tolerated procedure well and was able to be discharged home on POD#4. On day of discharge she was tolerating a diet and understood her drain care.   Discharge Orders: Discharge Instructions    Diet - low sodium heart healthy    Complete by:  As directed    Increase activity slowly    Complete by:  As directed       Disposition: 01-Home or Self Care  Discharge Medications: Allergies as of 01/29/2016      Reactions   Gabapentin Other (See Comments)   Seizure   Adhesive [tape] Itching   Silk tape is ok to use.      Medication List    TAKE these medications   acetaminophen 325 MG tablet Commonly known as:  TYLENOL Take 2 tablets (650 mg total) by mouth every 6 (six) hours as needed for mild pain (or Fever >/= 101).   ALPRAZolam 0.5 MG tablet Commonly known as:  XANAX Take 1 tablet (0.5 mg total) by mouth 2 (two) times daily as needed for anxiety.   benzonatate 200 MG capsule Commonly known as:  TESSALON Take 200 mg by mouth 3 (three) times daily as needed for cough.   feeding supplement (ENSURE ENLIVE) Liqd Take 237 mLs by mouth 3 (three) times daily between meals.   levETIRAcetam 500 MG tablet Commonly known as:  KEPPRA Take 1 tablet (500 mg total) by mouth 2 (two) times daily. What changed:  when to take this   losartan 50 MG tablet Commonly known as:  COZAAR Take 100 mg by mouth See admin instructions. Takes 100 MG DAILY on non-dialysis days=Tuesday, Thursday, Saturday and Sunday.   metoprolol 100 MG tablet Commonly known as:  LOPRESSOR Take 0.5 tablets (50 mg total) by mouth 2 (two) times daily. Takes on non-dialysis days=Tuesday, Thursday, Saturday and Sunday. What  changed:  how much to take  when to take this  additional instructions   Oxycodone HCl 10 MG Tabs Take 1 tablet (10 mg total) by mouth every 3 (three) hours as needed for severe pain or breakthrough pain.   pantoprazole 40 MG tablet Commonly known as:  PROTONIX Take 40 mg by mouth as needed.   sertraline 100 MG tablet Commonly known as:  ZOLOFT Take 100 mg by mouth daily.   traMADol 50 MG tablet Commonly known as:  ULTRAM Take 1 tablet (50 mg total) by mouth every 6 (six) hours as needed for moderate pain.   warfarin 5 MG tablet Commonly known as:  COUMADIN Take 1 tablet (5 MG )Friday, Saturday and Sundays. Take 1 1/2 tablets (7.5 MG) the other days of the week   zolpidem 10 MG tablet Commonly known as:  AMBIEN Take 10 mg by mouth at bedtime.        Follwup: Follow-up Information    Hubbard Robinson, MD Follow up on 01/31/2016.   Specialty:  Surgery Why:  f/u with Dr. Azalee Course on 01/31/16 at 2:00pm Contact information: [address] [place] [address] [phone]           Signed: Clayburn Pert 01/29/2016, 1:27 PM

## 2016-01-29 NOTE — Progress Notes (Signed)
[facility]  ROUNDING NOTE   Subjective:   Patient seen during dialysis Tolerating well    HEMODIALYSIS FLOWSHEET:  Blood Flow Rate (mL/min): 350 mL/min Arterial Pressure (mmHg): -190 mmHg Venous Pressure (mmHg): 160 mmHg Transmembrane Pressure (mmHg): 40 mmHg Ultrafiltration Rate (mL/min): 340 mL/min Dialysate Flow Rate (mL/min): 600 ml/min Conductivity: Machine : 14.3 Conductivity: Machine : 14.3 Dialysis Fluid Bolus: Normal Saline Bolus Amount (mL): 100 mL Dialysate Change: 2K Intra-Hemodialysis Comments: 663 mL removed. System flushed, venous chamber dark. Continue to monitor.     Objective:  Vital signs in last 24 hours:  Temp:  [97.6 F (36.4 C)-98.3 F (36.8 C)] 97.8 F (36.6 C) (01/22 0938) Pulse Rate:  [84-105] 95 (01/22 1158) Resp:  [15-18] 18 (01/22 1158) BP: (84-120)/(56-74) 97/68 (01/22 1158) SpO2:  [92 %-100 %] 96 % (01/22 1158) Weight:  [61.2 kg (135 lb)] 61.2 kg (135 lb) (01/22 0938)  Weight change:  Filed Weights   01/24/16 1007 01/24/16 1243 01/29/16 0938  Weight: 60.1 kg (132 lb 8 oz) 59.3 kg (130 lb 11.7 oz) 61.2 kg (135 lb)    Intake/Output: I/O last 3 completed shifts: In: 45 [P.O.:957] Out: 237.5 [Urine:75; Drains:162.5]   Intake/Output this shift:  Total I/O In: 240 [P.O.:240] Out: -   Physical Exam: General: NAD  Head: Normocephalic, atraumatic. Moist oral mucosal membranes  Eyes: Anicteric,    Neck: Supple, trachea midline  Chest Dressings with blood and JP drains  Lungs:  Clear to auscultation, chest binder in place  Heart: Regular rate and rhythm  Abdomen:  Soft, nontender,   Extremities:  no peripheral edema.  Neurologic: Nonfocal, A & O  Skin: No lesions  Access: LIJ permcath cath 1/19 Dr. Delana Meyer    Basic Metabolic Panel:  Recent Labs Lab 01/24/16 [id number] 01/24/16 1015 01/26/16 0418 01/27/16 0453 01/29/16 0941  NA 137 137 138 137 132*  K 5.0 4.4 4.3 4.0 5.4*  CL 102 102 103 101 93*  CO2 25 25 26  30 28   GLUCOSE 95 116* 92 84 109*  BUN 35* 36* 40* 23* 63*  CREATININE 6.19* 6.66* 7.33* 4.37* 7.72*  CALCIUM 9.1 8.7* 9.1 9.0 9.1  MG  --   --  1.8  --   --   PHOS  --  5.6* 5.8*  --  6.3*    Liver Function Tests:  Recent Labs Lab 01/24/16 1015 01/29/16 0941  ALBUMIN 2.6* 2.6*   No results for input(s): LIPASE, AMYLASE in the last 168 hours. No results for input(s): AMMONIA in the last 168 hours.  CBC:  Recent Labs Lab 01/25/16 0447 01/26/16 0418 01/27/16 0453 01/28/16 0413 01/29/16 0941  WBC 6.5 4.8 4.4 4.5 4.0  HGB 7.7* 6.7* 8.9* 8.9* 8.7*  HCT 22.2* 19.9* 26.0* 26.0* 25.3*  MCV 84.8 85.1 84.4 85.2 84.8  PLT 106* 107* 101* 106* 108*    Cardiac Enzymes: No results for input(s): CKTOTAL, CKMB, CKMBINDEX, TROPONINI in the last 168 hours.  BNP: Invalid input(s): POCBNP  CBG: No results for input(s): GLUCAP in the last 168 hours.  Microbiology: Results for orders placed or performed during the hospital encounter of 01/15/16  Rapid Influenza A&B Antigens [facility] only)     Status: None   Collection Time: 01/15/16  9:37 PM  Result Value Ref Range Status   Influenza A [facility]) NEGATIVE NEGATIVE Final   Influenza B ([place]) NEGATIVE NEGATIVE Final    Coagulation Studies:  Recent Labs  01/26/16 2016 01/27/16 0938 01/28/16 0413 01/29/16 1829  LABPROT 15.1 15.1 15.1 15.5*  INR 1.18 1.18 1.18 1.22    Urinalysis: No results for input(s): COLORURINE, LABSPEC, PHURINE, GLUCOSEU, HGBUR, BILIRUBINUR, KETONESUR, PROTEINUR, UROBILINOGEN, NITRITE, LEUKOCYTESUR in the last 72 hours.  Invalid input(s): APPERANCEUR    Imaging: No results found.   Medications:    . sodium chloride   Intravenous Once  . acetaminophen  650 mg Oral Q6H  . cyclobenzaprine  10 mg Oral TID  . feeding supplement (ENSURE ENLIVE)  237 mL Oral TID BM  . heparin subcutaneous  5,000 Units Subcutaneous Q8H  . levETIRAcetam  500 mg Oral BID  . pantoprazole  40 mg Oral Daily  . sertraline   100 mg Oral Daily  . sodium chloride flush  3 mL Intravenous Q12H  . warfarin  9 mg Oral ONCE-1800  . Warfarin - Pharmacist Dosing Inpatient   Does not apply q1800   sodium chloride, ALPRAZolam, benzonatate, HYDROmorphone (DILAUDID) injection, ondansetron **OR** ondansetron (ZOFRAN) IV, oxyCODONE, sodium chloride flush, traMADol, zolpidem  Assessment/ Plan:  [name] is a 61 y.o. black female  with ESRD on hemodialysis, breast cancer , CHF, depression, DVT, hypertension and seizures.   [place]. / MWF  1. End Stage Renal Disease: with complication of dialysis device. With temp HD catheter removed and now LIJ permcath placed and functioned well.   Patient seen during dialysis Tolerating well    2. Breast Cancer: mastectomy 1/16 Dr. Azalee Course.    3. Hypertension:  Hypotensive. Holding home blood pressure medications currently.  - home regimen of furosemide, metoprolol and losartan.   4. Anemia of chronic kidney disease: hemoglobin 8.9, status post PRBC transfusion on 1/17 and 1/19 - Holding epo due to malignacy  5. Secondary Hyperparathyroidism: with hyperphosphatemia 5.8 - sevelamer with meals - cinacalcet     LOS: 7 Erica [place] 1/22/201812:13 PM

## 2016-01-29 NOTE — Progress Notes (Signed)
Patient A&O, VSS.  Extensive discharge teaching performed including JP drain techniques.  Written material also provided.  Left femoral central line removed without difficulty; patient tolerated well.

## 2016-01-29 NOTE — Care Management (Signed)
Patient discharging home today.  Resumption of home health orders for RN and [facility] with [facility] notified of discharge.  Discharge summary faxed to [facility] liaison. RNCM signing off.

## 2016-01-29 NOTE — Progress Notes (Signed)
Pre dialysis assessment 

## 2016-01-30 ENCOUNTER — Encounter: Payer: Self-pay | Admitting: Vascular Surgery

## 2016-01-31 ENCOUNTER — Encounter: Payer: Self-pay | Admitting: Surgery

## 2016-01-31 ENCOUNTER — Ambulatory Visit (INDEPENDENT_AMBULATORY_CARE_PROVIDER_SITE_OTHER): Payer: Medicare Other | Admitting: Surgery

## 2016-01-31 ENCOUNTER — Telehealth: Payer: Self-pay | Admitting: Surgery

## 2016-01-31 VITALS — BP 130/89 | HR 114 | Temp 98.1°F | Ht 66.0 in | Wt 134.0 lb

## 2016-01-31 DIAGNOSIS — C50212 Malignant neoplasm of upper-inner quadrant of left female breast: Secondary | ICD-10-CM

## 2016-01-31 DIAGNOSIS — Z170 Estrogen receptor positive status [ER+]: Secondary | ICD-10-CM

## 2016-01-31 DIAGNOSIS — C50012 Malignant neoplasm of nipple and areola, left female breast: Secondary | ICD-10-CM

## 2016-01-31 NOTE — Telephone Encounter (Signed)
Will see patient in clinic today as scheduled.

## 2016-01-31 NOTE — Telephone Encounter (Signed)
Right breast has some leakage mixed with blood. Some pain. Patient just woke up and is a little concerned. Please call.

## 2016-01-31 NOTE — Patient Instructions (Signed)
Please place the petroleum dressing over the discolored area daily until your follow up appointment next week. Please continue to keep the added gauze over the incision areas and wear your bra until we let you know when not to. We have removed 2 of your drains today. We have placed a small dressing over the drain sites and please keep these covered until they close completely. Please see your appointment listed below. Please call our office if you have any questions or concerns.

## 2016-01-31 NOTE — Progress Notes (Signed)
01/31/2016  HPI: Patient is status post a lateral sentinel lymph node biopsies and bilateral mastectomies as well as excision of a scalp lesion with Dr. Azalee Course on 1/15. She is a history of left breast cancer and underwent neoadjuvant chemotherapy prior to her surgery. Today she reports that she is feeling sore on both sides but that her pain is controlled and improving. Denies and otherwise any fevers or chills, any chest pain or shortness of breath, any nausea or vomiting. She has been working well with her drains and has been able to empty them and record output each day.  Vital signs: BP 130/89   Pulse (!) 114   Temp 98.1 F (36.7 C) (Oral)   Ht 5\' 6"  (1.676 m)   Wt 60.8 kg (134 lb)   BMI 21.63 kg/m    Physical Exam: Constitutional: No acute distress Head: Incision is clean dry and intact with a scab but otherwise no evidence of infection and no wound breakdown. Chest: Bilateral incisions are clean dry and intact with no evidence of infection or drainage. On the superior portion of both incisions there is a small segment of what appears to be very mild flap ischemia with some discoloration of the skin but otherwise no gross necrosis. 4 drains are in place with serosanguineous fluid in the bulbs. There are no hematomas or seromas.  Assessment/Plan: 61 year old female status post bilateral sentinel node biopsies and bilateral mastectomies as well as excision of scalp lesion.  -Have removed the axillary drains #2 and #4 leaving in place the flap drains. -Have given the patient resting gauze so she can apply to the areas of the skin that appear mildly ischemic to help with moisturizing and help in healing. She'll then apply the fluff gauze and surgical bra. -Patient will follow up next week with Dr. Dahlia Byes for further evaluation of the wounds and possible removal of the other drains. -Pathology was reviewed with the patient.   Melvyn Neth, [place]

## 2016-02-05 NOTE — Progress Notes (Signed)
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  INTERVAL HISTORY: Patient returns to clinic today for routine evaluation and postsurgical follow-up. Patient ended up having a double mastectomy. She continues to have drains in, but otherwise is healing well and offers no complaints. Her weakness and fatigue have improved. She continues to receive dialysis on Mondays, Wednesdays, and Fridays. She has no neurologic complaints. She denies any recent fevers. She has a good appetite and denies weight loss. She has no chest pain or shortness of breath. She denies any nausea, vomiting, constipation, or diarrhea. Patient offers no further specific complaints.  REVIEW OF SYSTEMS:   Review of Systems  Constitutional: Positive for malaise/fatigue. Negative for fever and weight loss.  Respiratory: Negative.  Negative for cough and shortness of breath.   Cardiovascular: Negative.  Negative for chest pain.  Gastrointestinal: Negative.  Negative for abdominal pain.  Genitourinary: Negative.   Musculoskeletal: Negative.   Neurological: Positive for weakness.  Psychiatric/Behavioral: The patient is nervous/anxious.     As per HPI. Otherwise, a complete review of systems is negative.  PAST MEDICAL HISTORY: Past Medical History:  Diagnosis Date  . Anemia   . Anxiety   . Breast cancer ([place]) 01/2016   bilateral  . CHF (congestive heart failure) ([place])   . Chronic kidney disease   . Depression   . Dialysis patient ([place])   . DVT (deep venous thrombosis) ([facility])    left leg  . DVT (deep venous thrombosis) ([place]) 1985   right thigh  . Dysrhythmia   . Gout   .  Headache   . HTN (hypertension)   . Hypertension   . Parathyroid abnormality ([place])   . Parathyroid disease ([place])   . Pneumonia 12/2015  . Psoriasis   . Renal insufficiency   . Sickle cell trait ([place])    traits    PAST SURGICAL HISTORY: Past Surgical History:  Procedure Laterality Date  . ABDOMINAL HYSTERECTOMY  1980  . APPENDECTOMY    . BREAST BIOPSY Left 10/28/2013   benign  . BREAST EXCISIONAL BIOPSY Left 2002   benign  . INSERTION OF DIALYSIS CATHETER  2014  . LIPOMA EXCISION N/A 01/23/2016   Procedure: EXCISION LIPOMA;  Surgeon: Hubbard Robinson, MD;  Location: [facility] ORS;  Service: General;  Laterality: N/A;  . MASTECTOMY W/ SENTINEL NODE BIOPSY Bilateral 01/23/2016   Procedure: bilateral MASTECTOMY WITH  bilateral SENTINEL LYMPH NODE BIOPSY possible left axillary node dissection forehead lipoma removal;  Surgeon: Hubbard Robinson, MD;  Location: [facility] ORS;  Service: General;  Laterality: Bilateral;  . PARTIAL HYSTERECTOMY    . PERIPHERAL VASCULAR CATHETERIZATION N/A 12/25/2015   Procedure: Dialysis/Perma Catheter Insertion;  Surgeon: Algernon Huxley, MD;  Location: [facility];  Service: Cardiovascular;  Laterality: N/A;  . PERIPHERAL VASCULAR CATHETERIZATION Left 01/22/2016   Procedure: Dialysis/Perma Catheter Insertion;  Surgeon: Algernon Huxley, MD;  Location: [facility];  Service: Cardiovascular;  Laterality: Left;  . PERIPHERAL VASCULAR CATHETERIZATION N/A 01/26/2016   Procedure: Dialysis/Perma Catheter Insertion;  Surgeon: Katha Cabal, MD;  Location: [facility];  Service: Cardiovascular;  Laterality: N/A;  . PORT-A-CATH REMOVAL N/A 12/20/2015   Procedure: REMOVAL PORT-A-CATH;  Surgeon: Hubbard Robinson, MD;  Location: [facility] ORS;  Service: General;  Laterality: N/A;  left    . PORTACATH PLACEMENT Left 08/21/2015   Procedure: INSERTION PORT-A-CATH;  Surgeon: Hubbard Robinson, MD;  Location: [facility] ORS;  Service: General;  Laterality: Left;  .  REMOVAL OF A DIALYSIS CATHETER  2017    FAMILY HISTORY: Family History  Problem Relation Age of Onset  . Stroke Mother   . CVA Mother   . Hypertension Mother   . Hypertension Father   . Hypertension Sister   . Diabetes Sister   . Cancer Sister 17    Breast  . Stroke Brother   . Hypertension Brother   . Breast cancer Maternal Aunt 33       ADVANCED DIRECTIVES:    HEALTH MAINTENANCE: Social History  Substance Use Topics  . Smoking status: Never Smoker  . Smokeless tobacco: Never Used  . Alcohol use No     Colonoscopy:  PAP:  Bone density:  Lipid panel:  Allergies  Allergen Reactions  . Gabapentin Other (See Comments)    Seizure  . Adhesive [Tape] Itching    Silk tape is ok to use.    Current Outpatient Prescriptions  Medication Sig Dispense Refill  . acetaminophen (TYLENOL) 325 MG tablet Take 2 tablets (650 mg total) by mouth every 6 (six) hours as needed for mild pain (or Fever >/= 101).    Marland Kitchen ALPRAZolam (XANAX) 0.5 MG tablet Take 1 tablet (0.5 mg total) by mouth 2 (two) times daily as needed for anxiety. 20 tablet 0  . feeding supplement, ENSURE ENLIVE, (ENSURE ENLIVE) LIQD Take 237 mLs by mouth 3 (three) times daily between meals. (Patient taking differently: Take 237 mLs by mouth daily. ) 237 mL 12  . levETIRAcetam (KEPPRA) 500 MG tablet Take 1 tablet (500 mg total) by mouth 2 (two) times daily. (Patient taking differently: Take 500 mg by mouth daily. ) 60 tablet 0  . losartan (COZAAR) 50 MG tablet Take 100 mg by mouth See admin instructions. Takes 100 MG DAILY on non-dialysis days=Tuesday, Thursday, Saturday and Sunday.    . metoprolol (LOPRESSOR) 100 MG tablet Take 0.5 tablets (50 mg total) by mouth 2 (two) times daily. Takes on non-dialysis days=Tuesday, Thursday, Saturday and Sunday. (Patient taking differently: Take 100 mg by mouth See admin instructions. Takes 100 MG DAILY on non-dialysis days=Tuesday, Thursday, Saturday and Sunday.) 60 tablet 0  . oxyCODONE  10 MG TABS Take 1 tablet (10 mg total) by mouth every 3 (three) hours as needed for severe pain or breakthrough pain. 30 tablet 0  . pantoprazole (PROTONIX) 40 MG tablet Take 40 mg by mouth as needed.     . sertraline (ZOLOFT) 100 MG tablet Take 100 mg by mouth daily.     . traMADol (ULTRAM) 50 MG tablet Take 1 tablet (50 mg total) by mouth every 6 (six) hours as needed for moderate pain. 20 tablet 0  . warfarin (COUMADIN) 5 MG tablet Take 1 tablet (5 MG )Friday, Saturday and Sundays. Take 1 1/2 tablets (7.5 MG) the other days of the week    . zolpidem (AMBIEN) 10 MG tablet Take 10 mg by mouth at bedtime.     . benzonatate (TESSALON) 200 MG capsule Take 200 mg by mouth 3 (three) times daily as needed for cough.    . letrozole (FEMARA) 2.5 MG tablet Take 1 tablet (2.5 mg total) by mouth daily. 30 tablet 3   No current facility-administered medications for  this visit.     OBJECTIVE: Vitals:   02/06/16 1140  BP: (!) 135/98  Pulse: (!) 102  Resp: 18  Temp: 98.2 F (36.8 C)     Body mass index is 21.05 kg/m.    ECOG FS:0 - Asymptomatic  General: Well-developed, well-nourished, no acute distress. Eyes: Pink conjunctiva, anicteric sclera. Breasts: Bilateral mastectomy. Lungs: Clear to auscultation bilaterally. Chest wall: Dialysis catheter in right chest wall, port noted in left chest wall. Heart: Regular rate and rhythm. No rubs, murmurs, or gallops. Abdomen: Soft, nontender, nondistended. No organomegaly noted, normoactive bowel sounds. Musculoskeletal: No edema, cyanosis, or clubbing. Neuro: Alert, answering all questions appropriately. Cranial nerves grossly intact. Skin: No rashes or petechiae noted. Psych: Normal affect.   LAB RESULTS:  Lab Results  Component Value Date   NA 132 (L) 01/29/2016   K 5.4 (H) 01/29/2016   CL 93 (L) 01/29/2016   CO2 28 01/29/2016   GLUCOSE 109 (H) 01/29/2016   BUN 63 (H) 01/29/2016   CREATININE 7.72 (H) 01/29/2016   CALCIUM 9.1 01/29/2016    PROT 6.7 01/15/2016   ALBUMIN 2.6 (L) 01/29/2016   AST 25 01/15/2016   ALT 23 01/15/2016   ALKPHOS 225 (H) 01/15/2016   BILITOT 0.5 01/15/2016   GFRNONAA 5 (L) 01/29/2016   GFRAA 6 (L) 01/29/2016    Lab Results  Component Value Date   WBC 4.0 01/29/2016   NEUTROABS 3.6 12/20/2015   HGB 8.7 (L) 01/29/2016   HCT 25.3 (L) 01/29/2016   MCV 84.8 01/29/2016   PLT 108 (L) 01/29/2016     STUDIES: Dg Chest 2 View  Result Date: 01/15/2016 CLINICAL DATA:  Weakness and shortness of breath after dialysis EXAM: CHEST  2 VIEW COMPARISON:  12/24/2015 FINDINGS: Lungs are hyperinflated. Ascending catheter tip overlies the low right atrium. There is no acute consolidation or effusion. Stable cardiomediastinal silhouette with atherosclerosis. No pneumothorax. Stable kyphosis. IMPRESSION: 1. No acute infiltrate or edema 2. Atherosclerosis of the aorta Electronically Signed   By: Donavan Foil M.D.   On: 01/15/2016 21:39   Ct Angio Chest Pe W And/or Wo Contrast  Result Date: 01/15/2016 CLINICAL DATA:  Diarrhea since yesterday. Dialysis today. Weakness and shortness of breath. EXAM: CT ANGIOGRAPHY CHEST WITH CONTRAST TECHNIQUE: Multidetector CT imaging of the chest was performed using the standard protocol during bolus administration of intravenous contrast. Multiplanar CT image reconstructions and MIPs were obtained to evaluate the vascular anatomy. CONTRAST:  75 mL Isovue 370 COMPARISON:  08/31/2015 FINDINGS: Cardiovascular: Satisfactory opacification of the pulmonary arteries to the segmental level. No evidence of pulmonary embolism. Mild cardiac enlargement. No pericardial effusion. Ascending thoracic aorta measuring 4.5 cm maximal AP diameter. No change since prior study. Scattered aortic calcification. Calcification of the coronary arteries. Mediastinum/Nodes: No enlarged mediastinal, hilar, or axillary lymph nodes. Asymmetric nodular enlargement of the right thyroid gland with focal hypoenhancing nodule  measuring 2.5 cm diameter. Trachea and esophagus demonstrate no significant findings. Lungs/Pleura: Mild dependent atelectasis in the lung bases. No focal consolidation. Airways are patent. No pleural effusions. No pneumothorax. Upper Abdomen: Nonobstructing stone in the right kidney. Central venous catheter demonstrated at the inferior vena caval atrial junction. Musculoskeletal: Circumscribed soft tissue lesion demonstrated in the right breast. This was not definitely present previously and could represent developing abscess, hematoma, or mass lesion. Elective mammographic correlation is recommended. Nodular soft tissue structure in the subcutaneous fat inferior to the right supraclavicular region measuring 12 mm diameter. On the previous study, there was a central  venous catheter inserted a dislocation this likely represents postprocedural scarring or fluid. Review of the MIP images confirms the above findings. IMPRESSION: 1. No evidence of significant pulmonary embolus. 2. 4.5 cm diameter ascending thoracic aortic aneurysm. No change since previous study. Ascending thoracic aortic aneurysm. Recommend semi-annual imaging followup by CTA or MRA and referral to cardiothoracic surgery if not already obtained. This recommendation follows 2010 ACCF/AHA/AATS/ACR/ASA/SCA/SCAI/SIR/STS/SVM Guidelines for the Diagnosis and Management of Patients With Thoracic Aortic Disease. Circulation. 2010; 121: e266-e369 3. 2.5 cm diameter right thyroid nodule. Ultrasound recommended for further evaluation. 4. Indeterminate 3.5 cm diameter right breast lesion. Mammographic follow-up recommended. 5. No evidence of active pulmonary disease. Electronically Signed   By: Lucienne Capers M.D.   On: 01/15/2016 22:50   Nm Sentinel Node Injection  Result Date: 01/23/2016 CLINICAL DATA:  Biopsy proven left breast cancer. Recent MRI showed abnormal enhancement in the right breast. Right biopsy performed 11/16/2015. EXAM: NUCLEAR MEDICINE  BREAST LYMPHOSCINTIGRAPHY , BILATERAL TECHNIQUE: The procedure, risks (including but not limited to bleeding, infection, organ damage ), benefits, and alternatives were explained to the patient. Questions regarding the procedure were encouraged and answered. The patient understands and consents to the procedure. Intradermal injection of radiopharmaceutical was performed at the 9 o'clock position of the RIGHT nipple. Intradermal injection of radiopharmaceutical was performed at the 3 o'clock position of the LEFT nipple. The patient was then sent to the operating room for sentinel node(s) identification and removal by the surgeon. RADIOPHARMACEUTICALS:  Total of 1.01+ 1.13 mCi Millipore-filtered Technetium-88msulfur colloid IMPRESSION: Uncomplicated bilateral intradermal injection of a Technetium-981mulfur colloid for purposes of sentinel node identification. Electronically Signed   By: D Lucrezia Europe.D.   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  PLAN:    1. 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 Her surgery was delayed secondary to hospital admissions, but she ultimately decided to proceed with a double mastectomy. Because of this, she does not require XRT. Patient was given a prescription for letrozole today she will be required to take for 5 years completing in February 2023. Patient has indicated that she may not want to proceed with additional medication, but is willing to give it a try for several months. Return to clinic in 3 months for further evaluation. If patient discontinues letrozole, she possibly could be discharged from clinic with continued follow-up with her PCP.    2. End-stage renal disease: Continue dialysis on  Monday, Wednesday, and Fridays. 3. Anemia: Likely multifactorial. Monitor. 4. Postmenopausal: Will get baseline bone mineral density in the next 1-2 weeks. 5. Hypertension: Improved, monitor.   Approximately 30 minutes was spent in discussion of which greater than 50% was consultation.   Patient expressed understanding and was in agreement with this plan. She also understands that She can call clinic at any time with any questions, concerns, or complaints.   Cancer Staging Malignant neoplasm of upper-inner quadrant of left breast in female, estrogen receptor positive ([place]: Breast, AJCC 7th Edition - Pathologic stage from 02/05/2016: Stage IIA (T1b, N1a, cM0) - Signed by TiLloyd HugerMD on 02/05/2016   TiLloyd HugerMD   02/09/2016 10:06 AM

## 2016-02-06 ENCOUNTER — Ambulatory Visit (INDEPENDENT_AMBULATORY_CARE_PROVIDER_SITE_OTHER): Payer: Medicare Other | Admitting: Surgery

## 2016-02-06 ENCOUNTER — Encounter: Payer: Self-pay | Admitting: Surgery

## 2016-02-06 ENCOUNTER — Inpatient Hospital Stay: Payer: Medicare Other | Attending: Oncology | Admitting: Oncology

## 2016-02-06 VITALS — BP 162/104 | HR 90 | Temp 98.3°F | Ht 66.0 in | Wt 129.6 lb

## 2016-02-06 VITALS — BP 135/98 | HR 102 | Temp 98.2°F | Resp 18 | Wt 130.4 lb

## 2016-02-06 DIAGNOSIS — Z9013 Acquired absence of bilateral breasts and nipples: Secondary | ICD-10-CM | POA: Insufficient documentation

## 2016-02-06 DIAGNOSIS — Z170 Estrogen receptor positive status [ER+]: Secondary | ICD-10-CM | POA: Insufficient documentation

## 2016-02-06 DIAGNOSIS — I509 Heart failure, unspecified: Secondary | ICD-10-CM | POA: Insufficient documentation

## 2016-02-06 DIAGNOSIS — Z86718 Personal history of other venous thrombosis and embolism: Secondary | ICD-10-CM | POA: Diagnosis not present

## 2016-02-06 DIAGNOSIS — Z992 Dependence on renal dialysis: Secondary | ICD-10-CM | POA: Insufficient documentation

## 2016-02-06 DIAGNOSIS — F419 Anxiety disorder, unspecified: Secondary | ICD-10-CM | POA: Diagnosis not present

## 2016-02-06 DIAGNOSIS — Z79899 Other long term (current) drug therapy: Secondary | ICD-10-CM | POA: Insufficient documentation

## 2016-02-06 DIAGNOSIS — C50212 Malignant neoplasm of upper-inner quadrant of left female breast: Secondary | ICD-10-CM | POA: Insufficient documentation

## 2016-02-06 DIAGNOSIS — D631 Anemia in chronic kidney disease: Secondary | ICD-10-CM | POA: Diagnosis not present

## 2016-02-06 DIAGNOSIS — Z09 Encounter for follow-up examination after completed treatment for conditions other than malignant neoplasm: Secondary | ICD-10-CM

## 2016-02-06 DIAGNOSIS — N186 End stage renal disease: Secondary | ICD-10-CM | POA: Diagnosis not present

## 2016-02-06 DIAGNOSIS — D573 Sickle-cell trait: Secondary | ICD-10-CM | POA: Insufficient documentation

## 2016-02-06 DIAGNOSIS — I129 Hypertensive chronic kidney disease with stage 1 through stage 4 chronic kidney disease, or unspecified chronic kidney disease: Secondary | ICD-10-CM | POA: Diagnosis not present

## 2016-02-06 DIAGNOSIS — Z780 Asymptomatic menopausal state: Secondary | ICD-10-CM

## 2016-02-06 MED ORDER — LETROZOLE 2.5 MG PO TABS: 2.5000 mg | ORAL_TABLET | Freq: Every day | ORAL | 3 refills | Status: DC

## 2016-02-06 NOTE — Progress Notes (Signed)
S/p Bilat mastectomies w [facility] Doing well Less than 15-10 cc from the left drain . About 45 cc /day from the right one No complaints , minimal pain. No fevers or chills  PE NAD Chest: flaps healing well, right JP removed, there is very mild erythema along the edge of the incision on the right. No hematoma or infection. Right side healing well, no infection. No lymphedema  A/P Doing well Keep Right JP for now RTC next week possible drain removal

## 2016-02-06 NOTE — Patient Instructions (Signed)
Please see your follow up appointment listed below. Please call our office if you have questions or concerns.

## 2016-02-06 NOTE — Progress Notes (Signed)
Patient is here for follow up she is doing well she does mention some nausea with no vomiting. Has not taken her bp meds today

## 2016-02-14 ENCOUNTER — Emergency Department
Admission: EM | Admit: 2016-02-14 | Discharge: 2016-02-14 | Disposition: A | Discharge status: Home / Self Care | Payer: Medicare Other | Attending: Student in an Organized Health Care Education/Training Program | Admitting: Student in an Organized Health Care Education/Training Program

## 2016-02-14 ENCOUNTER — Emergency Department: Payer: Medicare Other

## 2016-02-14 ENCOUNTER — Encounter: Payer: Self-pay | Admitting: Emergency Medicine

## 2016-02-14 DIAGNOSIS — I5022 Chronic systolic (congestive) heart failure: Secondary | ICD-10-CM | POA: Insufficient documentation

## 2016-02-14 DIAGNOSIS — Z9013 Acquired absence of bilateral breasts and nipples: Secondary | ICD-10-CM | POA: Insufficient documentation

## 2016-02-14 DIAGNOSIS — Z7901 Long term (current) use of anticoagulants: Secondary | ICD-10-CM | POA: Insufficient documentation

## 2016-02-14 DIAGNOSIS — R0789 Other chest pain: Secondary | ICD-10-CM | POA: Insufficient documentation

## 2016-02-14 DIAGNOSIS — R079 Chest pain, unspecified: Secondary | ICD-10-CM

## 2016-02-14 DIAGNOSIS — Z992 Dependence on renal dialysis: Secondary | ICD-10-CM | POA: Insufficient documentation

## 2016-02-14 DIAGNOSIS — I132 Hypertensive heart and chronic kidney disease with heart failure and with stage 5 chronic kidney disease, or end stage renal disease: Secondary | ICD-10-CM | POA: Insufficient documentation

## 2016-02-14 DIAGNOSIS — Z79899 Other long term (current) drug therapy: Secondary | ICD-10-CM | POA: Insufficient documentation

## 2016-02-14 DIAGNOSIS — Z853 Personal history of malignant neoplasm of breast: Secondary | ICD-10-CM | POA: Diagnosis not present

## 2016-02-14 DIAGNOSIS — N186 End stage renal disease: Secondary | ICD-10-CM | POA: Diagnosis not present

## 2016-02-14 LAB — LIPASE, BLOOD: LIPASE: 24 U/L (ref 11–51)

## 2016-02-14 LAB — COMPREHENSIVE METABOLIC PANEL
ALT: 16 U/L (ref 14–54)
AST: 21 U/L (ref 15–41)
Albumin: 3.7 g/dL (ref 3.5–5.0)
Alkaline Phosphatase: 286 U/L — ABNORMAL HIGH (ref 38–126)
Anion gap: 11 (ref 5–15)
BUN: 40 mg/dL — ABNORMAL HIGH (ref 6–20)
CHLORIDE: 101 mmol/L (ref 101–111)
CO2: 24 mmol/L (ref 22–32)
Calcium: 9.6 mg/dL (ref 8.9–10.3)
Creatinine, Ser: 6.89 mg/dL — ABNORMAL HIGH (ref 0.44–1.00)
GFR, EST AFRICAN AMERICAN: 7 mL/min — AB (ref >60)
GFR, EST NON AFRICAN AMERICAN: 6 mL/min — AB (ref >60)
Glucose, Bld: 95 mg/dL (ref 65–99)
POTASSIUM: 4.2 mmol/L (ref 3.5–5.1)
SODIUM: 136 mmol/L (ref 135–145)
Total Bilirubin: 0.6 mg/dL (ref 0.3–1.2)
Total Protein: 6.8 g/dL (ref 6.5–8.1)

## 2016-02-14 LAB — CBC WITH DIFFERENTIAL/PLATELET
Basophils Absolute: 0 10*3/uL (ref 0–0.1)
Basophils Relative: 1 %
Eosinophils Absolute: 0.2 10*3/uL (ref 0–0.7)
Eosinophils Relative: 3 %
HEMATOCRIT: 29.9 % — AB (ref 35.0–47.0)
HEMOGLOBIN: 10.4 g/dL — AB (ref 12.0–16.0)
LYMPHS PCT: 19 %
Lymphs Abs: 1 10*3/uL (ref 1.0–3.6)
MCH: 29.5 pg (ref 26.0–34.0)
MCHC: 34.8 g/dL (ref 32.0–36.0)
MCV: 85 fL (ref 80.0–100.0)
Monocytes Absolute: 0.3 10*3/uL (ref 0.2–0.9)
Monocytes Relative: 7 %
NEUTROS ABS: 3.6 10*3/uL (ref 1.4–6.5)
NEUTROS PCT: 70 %
Platelets: 207 10*3/uL (ref 150–440)
RBC: 3.52 MIL/uL — AB (ref 3.80–5.20)
RDW: 16.3 % — ABNORMAL HIGH (ref 11.5–14.5)
WBC: 5.1 10*3/uL (ref 3.6–11.0)

## 2016-02-14 LAB — PROTIME-INR
INR: 1.92
Prothrombin Time: 22.2 seconds — ABNORMAL HIGH (ref 11.4–15.2)

## 2016-02-14 LAB — TROPONIN I

## 2016-02-14 MED ORDER — PROMETHAZINE HCL 25 MG/ML IJ SOLN
12.5000 mg | Freq: Once | INTRAMUSCULAR | Status: AC
  Administered 2016-02-14: 12.5 mg via INTRAVENOUS
  Filled 2016-02-14: qty 1

## 2016-02-14 MED ORDER — TRAMADOL HCL 50 MG PO TABS: 50.0000 mg | ORAL_TABLET | Freq: Four times a day (QID) | ORAL | 0 refills | Status: DC | PRN

## 2016-02-14 MED ORDER — MORPHINE SULFATE (PF) 4 MG/ML IV SOLN
4.0000 mg | INTRAVENOUS | Status: DC | PRN
  Administered 2016-02-14: 4 mg via INTRAVENOUS
  Filled 2016-02-14: qty 1

## 2016-02-14 NOTE — ED Provider Notes (Signed)
[facility] Emergency Department Provider Note    First MD Initiated Contact with Patient 02/14/16 [phone]     (approximate)  I have reviewed the triage vital signs and the nursing notes.   HISTORY  Chief Complaint Post-op Problem and Chest Pain    HPI Laurie Penado is a 61 y.o. female complex recent medical history including bilateral radical mastectomy due to breast cancer as well as end-stage renal disease on Monday Wednesday Friday dialysis presents with right sided chest wall pain that radiates up the right back into her shoulder blade that is been ongoing since 5:00 yesterday evening. Denies any pain with taking a deep breath. She is on Coumadin. Denies any shortness of breath. There is no change in character of the pain with movement. No change in the character of pain with lying down or exertion. Patient states the pain started when she was going to see her husband at the funeral home. She just recently lost her husband and was not able to see him while in the hospital as she was undergoing treatment herself. Yesterday was the first time that she had seen the body. States it is very stressful with multiple family members crying and screaming and telling her not to see her husband. States that the pain has been persistent since yesterday.   Past Medical History:  Diagnosis Date  . Anemia   . Anxiety   . Breast cancer ([place]) 01/2016   bilateral  . CHF (congestive heart failure) ([place])   . Chronic kidney disease   . Depression   . Dialysis patient ([place])   . DVT (deep venous thrombosis) (HCC)    left leg  . DVT (deep venous thrombosis) ([place]) 1985   right thigh  . Dysrhythmia   . Gout   . Headache   . HTN (hypertension)   . Hypertension   . Parathyroid abnormality ([place])   . Parathyroid disease ([place])   . Pneumonia 12/2015  . Psoriasis   . Renal insufficiency   . Sickle cell trait (HCC)    traits   Family History  Problem Relation Age of Onset    . Stroke Mother   . CVA Mother   . Hypertension Mother   . Hypertension Father   . Hypertension Sister   . Diabetes Sister   . Cancer Sister 44    Breast  . Stroke Brother   . Hypertension Brother   . Breast cancer Maternal Aunt 70   Past Surgical History:  Procedure Laterality Date  . ABDOMINAL HYSTERECTOMY  1980  . APPENDECTOMY    . BREAST BIOPSY Left 10/28/2013   benign  . BREAST EXCISIONAL BIOPSY Left 2002   benign  . INSERTION OF DIALYSIS CATHETER  2014  . LIPOMA EXCISION N/A 01/23/2016   Procedure: EXCISION LIPOMA;  Surgeon: Hubbard Johnnetta Holstine, MD;  Location: [facility] ORS;  Service: General;  Laterality: N/A;  . MASTECTOMY W/ SENTINEL NODE BIOPSY Bilateral 01/23/2016   Procedure: bilateral MASTECTOMY WITH  bilateral SENTINEL LYMPH NODE BIOPSY possible left axillary node dissection forehead lipoma removal;  Surgeon: Hubbard Alissandra Geoffroy, MD;  Location: [facility] ORS;  Service: General;  Laterality: Bilateral;  . PARTIAL HYSTERECTOMY    . PERIPHERAL VASCULAR CATHETERIZATION N/A 12/25/2015   Procedure: Dialysis/Perma Catheter Insertion;  Surgeon: Algernon Huxley, MD;  Location: [place] [facility];  Service: Cardiovascular;  Laterality: N/A;  . PERIPHERAL VASCULAR CATHETERIZATION Left 01/22/2016   Procedure: Dialysis/Perma Catheter Insertion;  Surgeon: Algernon Huxley, MD;  Location: [name] [facility];  Service: Cardiovascular;  Laterality: Left;  . PERIPHERAL VASCULAR CATHETERIZATION N/A 01/26/2016   Procedure: Dialysis/Perma Catheter Insertion;  Surgeon: Katha Cabal, MD;  Location: [place] [facility];  Service: Cardiovascular;  Laterality: N/A;  . PORT-A-CATH REMOVAL N/A 12/20/2015   Procedure: REMOVAL PORT-A-CATH;  Surgeon: Hubbard Daymian Lill, MD;  Location: [facility] ORS;  Service: General;  Laterality: N/A;  left    . PORTACATH PLACEMENT Left 08/21/2015   Procedure: INSERTION PORT-A-CATH;  Surgeon: Hubbard Yehya Brendle, MD;  Location: [facility] OR[facility];  Service: General;  Laterality: Left;  .  REMOVAL OF A DIALYSIS CATHETER  2017   Patient Active Problem List   Diagnosis Date Noted  . Scalp lesion   . Breast cancer ([place]) 01/23/2016  . ESRD (end stage renal disease) ([place]) 01/22/2016  . Severe anemia 01/01/2016  . Seizure ([place]) 12/31/2015  . Healthcare-associated pneumonia   . Bloodstream infection due to port-a-cath, initial encounter   . Sepsis ([place]) 12/15/2015  . Chest pain 08/31/2015  . SIRS (systemic inflammatory response syndrome) ([place]) 08/27/2015  . Malignant neoplasm of upper-inner quadrant of left breast in female, estrogen receptor positive ([place]) 08/05/2015  . Pulmonary edema 07/09/2015  . Dizziness 03/03/2015  . Seizures ([place]) 03/03/2015  . Spells 11/11/2014  . Acute respiratory failure with hypoxia ([place]) 09/19/2014  . Left ventricular dysfunction 09/06/2014  . Acute pulmonary edema ([place]) 08/30/2014  . Respiratory failure ([place]) 08/29/2014  . Respiratory difficulty 08/29/2014  . Chronic systolic heart failure ([place]) 05/16/2014  . Hypertension 05/16/2014  . Renal failure 05/16/2014  . Anxiety 05/16/2014  . Sickle cell trait ([place]) 03/03/2014  . ESRD on dialysis ([place]) 11/02/2013  . Dysgeusia 01/27/2013  . Weight loss 01/27/2013  . Resting tremor 08/13/2012  . Depression 04/30/2012  . 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 Right leg DVT ([place]) 04/30/2012      Prior to Admission medications   Medication Sig Start Date End Date Taking? Authorizing Provider  acetaminophen (TYLENOL) 325 MG tablet Take 2 tablets (650 mg total) by mouth every 6 (six) hours as needed for mild pain (or Fever >/= 101). 12/27/15   [name], MD  ALPRAZolam Duanne Moron) 0.5 MG tablet Take 1 tablet (0.5 mg total) by mouth 2 (two) times daily as needed for anxiety. 12/27/15   [name], MD  benzonatate (TESSALON) 200 MG capsule Take 200 mg by mouth 3 (three) times daily as needed for cough.    Historical Provider, MD  feeding supplement, ENSURE ENLIVE, (ENSURE ENLIVE) LIQD  Take 237 mLs by mouth 3 (three) times daily between meals. Patient taking differently: Take 237 mLs by mouth daily.  12/27/15   [name], MD  letrozole (FEMARA) 2.5 MG tablet Take 1 tablet (2.5 mg total) by mouth daily. 02/06/16   [name], MD  levETIRAcetam (KEPPRA) 500 MG tablet Take 1 tablet (500 mg total) by mouth 2 (two) times daily. Patient taking differently: Take 500 mg by mouth daily.  01/03/16   Max Sane, MD  losartan (COZAAR) 50 MG tablet Take 100 mg by mouth See admin instructions. Takes 100 MG DAILY on non-dialysis days=Tuesday, Thursday, Saturday and Sunday. 09/07/14   Historical Provider, MD  metoprolol (LOPRESSOR) 100 MG tablet Take 0.5 tablets (50 mg total) by mouth 2 (two) times daily. Takes on non-dialysis days=Tuesday, Thursday, Saturday and Sunday. Patient taking differently: Take 100 mg by mouth See admin instructions. Takes 100 MG DAILY on non-dialysis days=Tuesday, Thursday, Saturday and Sunday. 12/27/15  [name], MD  oxyCODONE 10 MG TABS Take 1 tablet (10 mg total) by mouth every 3 (three) hours as needed for severe pain or breakthrough pain. 01/29/16   Clayburn Pert, MD  pantoprazole (PROTONIX) 40 MG tablet Take 40 mg by mouth as needed.     Historical Provider, MD  sertraline (ZOLOFT) 100 MG tablet Take 100 mg by mouth daily.  03/05/12   Historical Provider, MD  traMADol (ULTRAM) 50 MG tablet Take 1 tablet (50 mg total) by mouth every 6 (six) hours as needed for moderate pain. 12/27/15   [name], MD  warfarin (COUMADIN) 5 MG tablet Take 1 tablet (5 MG )Friday, Saturday and Sundays. Take 1 1/2 tablets (7.5 MG) the other days of the week 11/03/13   Historical Provider, MD  zolpidem (AMBIEN) 10 MG tablet Take 10 mg by mouth at bedtime.     Historical Provider, MD    Allergies Gabapentin and Adhesive [tape]    Social History Social History  Substance Use Topics  . Smoking status: Never Smoker  . Smokeless tobacco: Never Used  . Alcohol use No     Review of Systems Patient denies headaches, rhinorrhea, blurry vision, numbness, shortness of breath, chest pain, edema, cough, abdominal pain, nausea, vomiting, diarrhea, dysuria, fevers, rashes or hallucinations unless otherwise stated above in HPI. ____________________________________________   PHYSICAL EXAM:  VITAL SIGNS: Vitals:   02/14/16 0842  BP: (!) 179/108  Pulse: 87  Resp: 18  Temp: 98.5 F (36.9 C)    Constitutional: Alert and oriented. in no acute distress. Eyes: Conjunctivae are normal. PERRL. EOMI. Head: Atraumatic. Nose: No congestion/rhinnorhea. Mouth/Throat: Mucous membranes are moist.  Oropharynx non-erythematous. Neck: No stridor. Painless ROM. No cervical spine tenderness to palpation Hematological/Lymphatic/Immunilogical: No cervical lymphadenopathy. Cardiovascular: Normal rate, regular rhythm. Grossly normal heart sounds.  Good peripheral circulation. Respiratory: Normal respiratory effort.  No retractions. Lungs CTAB.  Bilateral radical mastectomy scar c/d/i.  Right jp drain with serosanguinous drainage, no surrounding erythema.  Right posterior hemithorax ttp, no crepitus, erythema or ecchymosis noted Gastrointestinal: Soft and nontender. No distention. No abdominal bruits. No CVA tenderness. Musculoskeletal: No lower extremity tenderness nor edema.  No joint effusions. Neurologic:  Normal speech and language. No gross focal neurologic deficits are appreciated. No gait instability. Skin:  Skin is warm, dry and intact. No rash noted. Psychiatric: Mood and affect are normal. Speech and behavior are normal.  ____________________________________________   LABS (all labs ordered are listed, but only abnormal results are displayed)  Results for orders placed or performed during the hospital encounter of 02/14/16 (from the past 24 hour(s))  Troponin I     Status: None   Collection Time: 02/14/16  9:21 AM  Result Value Ref Range   Troponin I <0.03 <0.03  ng/mL  Comprehensive metabolic panel     Status: Abnormal   Collection Time: 02/14/16  9:21 AM  Result Value Ref Range   Sodium 136 135 - 145 mmol/L   Potassium 4.2 3.5 - 5.1 mmol/L   Chloride 101 101 - 111 mmol/L   CO2 24 22 - 32 mmol/L   Glucose, Bld 95 65 - 99 mg/dL   BUN 40 (H) 6 - 20 mg/dL   Creatinine, Ser 6.89 (H) 0.44 - 1.00 mg/dL   Calcium 9.6 8.9 - 10.3 mg/dL   Total Protein 6.8 6.5 - 8.1 g/dL   Albumin 3.7 3.5 - 5.0 g/dL   AST 21 15 - 41 U/L   ALT 16 14 - 54 U/L  Alkaline Phosphatase 286 (H) 38 - 126 U/L   Total Bilirubin 0.6 0.3 - 1.2 mg/dL   GFR calc non Af Amer 6 (L) >60 mL/min   GFR calc Af Amer 7 (L) >60 mL/min   Anion gap 11 5 - 15  CBC with Differential     Status: Abnormal   Collection Time: 02/14/16  9:21 AM  Result Value Ref Range   WBC 5.1 3.6 - 11.0 K/uL   RBC 3.52 (L) 3.80 - 5.20 MIL/uL   Hemoglobin 10.4 (L) 12.0 - 16.0 g/dL   HCT 29.9 (L) 35.0 - 47.0 %   MCV 85.0 80.0 - 100.0 fL   MCH 29.5 26.0 - 34.0 pg   MCHC 34.8 32.0 - 36.0 g/dL   RDW 16.3 (H) 11.5 - 14.5 %   Platelets 207 150 - 440 K/uL   Neutrophils Relative % 70 %   Neutro Abs 3.6 1.4 - 6.5 K/uL   Lymphocytes Relative 19 %   Lymphs Abs 1.0 1.0 - 3.6 K/uL   Monocytes Relative 7 %   Monocytes Absolute 0.3 0.2 - 0.9 K/uL   Eosinophils Relative 3 %   Eosinophils Absolute 0.2 0 - 0.7 K/uL   Basophils Relative 1 %   Basophils Absolute 0.0 0 - 0.1 K/uL  Lipase, blood     Status: None   Collection Time: 02/14/16  9:21 AM  Result Value Ref Range   Lipase 24 11 - 51 U/L  Protime-INR     Status: Abnormal   Collection Time: 02/14/16  9:21 AM  Result Value Ref Range   Prothrombin Time 22.2 (H) 11.4 - 15.2 seconds   INR 1.92   Troponin I     Status: None   Collection Time: 02/14/16 11:14 AM  Result Value Ref Range   Troponin I <0.03 <0.03 ng/mL   ____________________________________________  EKG My review and personal interpretation at Time: 8:52   Indication: chest pain  Rate: 85   Rhythm: sinus Axis: normal Other: non specific t wave changes that are consistent with previous EKG 01/18/16 ____________________________________________  RADIOLOGY  Have discussed with the patient and available family all diagnostics and treatments performed thus far and all questions were answered to the best of my ability. The patient demonstrates understanding and agreement with plan.  ____________________________________________   PROCEDURES  Procedure(s) performed:  Procedures    Critical Care performed: no ____________________________________________   INITIAL IMPRESSION / ASSESSMENT AND PLAN / ED COURSE  Pertinent labs & imaging results that were available during my care of the patient were reviewed by me and considered in my medical decision making (see chart for details).  DDX: ACS, pericarditis, esophagitis, boerhaaves, pe, dissection, pna, bronchitis, costochondritis   Nasya Vincent is a 61 y.o. who presents to the ED with right hemithorax pain as described above. Patient afebrile hemodynamic stable. Does not appear to be in any respiratory distress. Patient with complex recent medical history and recent stressors given the loss of her recent husband. Differential is broad at this time as the patient is high risk as she is recently postop and on dialysis with a fascia catheter at her in place. Do not feel this is clinically consistent with dissection as she has good pulses bilaterally with no neuro deficit. Less consistent with ACS as there is no exertional component the patient is at high risk and will require serial troponins. Postop infection is on the differential on the patient is afebrile. No evidence of crepitus. Chest x-ray without any evidence  of lobar pneumonia. Unlikely to be PE as the patient is on Coumadin.  The patient will be placed on continuous pulse oximetry and telemetry for monitoring.  Laboratory evaluation will be sent to evaluate for the above  complaints.     Clinical Course as of Feb 14 1356  Wed Feb 14, 2016  1006 Patient with reassuring basic metabolic panel will but does have elevated alkaline phosphatase. In the setting of right shoulder pain will order upper quadrant ultrasound to evaluate for any evidence of gallbladder disease.  [PR]  1113 RUQ [place] with normal gall bladder.  Renal disease noted and consistent with ESRD.    [PR]  1124 Dr. Rolly Salter has been to evaluate patient and agrees that there is no indication for emergent dialysis at this time.  I do suspect some component of MSK pain.  Not consistent with PE as the patient was recently scanned for same, no LE edema and is on coumadin.  Trop negative.  No leukocytosis or fever to suggest acute infectious process.  Will repeat trop and continue to monitor.  [PR]  1246 Patient reassessed. A symptomatically at this time. He medically stable. Repeat troponin is negative. Do not feel that CT imaging clinically indicated at this time and she does not have a white count, no fever no evidence of crepitus or overlying cellulitis. I do not feel is consistent with acute infectious process. Possible muscle spasm in the setting of back muscle overuse status post recent mastectomy. Due to patient is otherwise clinically stable for close outpatient follow-up.. Likely muscle skeletal pain.  [PR]    Clinical Course User Index [PR] Merlyn Lot, MD     ____________________________________________   FINAL CLINICAL IMPRESSION(S) / ED DIAGNOSES  Final diagnoses:  Right-sided chest pain  Status post mastectomy, bilateral  ESRD (end stage renal disease) ([place])      NEW MEDICATIONS STARTED DURING THIS VISIT:  New Prescriptions   No medications on file     Note:  This document was prepared using Dragon voice recognition software and may include unintentional dictation errors.    Merlyn Lot, MD 02/14/16 [id number]

## 2016-02-14 NOTE — ED Triage Notes (Addendum)
Pt presents to ED via POV. Pt c/o nausea that started last night. Pt states approx 1 month ago had a double mastectomy, pt has JP drain in place to R breast at this time. Pt also reports yellow drainage coming from the drain. Pt also c/o seeing "stars" in her vision, and reports pain to R lower side of chest that radiates around her back and up to her R shoulder.

## 2016-02-14 NOTE — ED Notes (Signed)
Pt transported to xray prior to RN assessment.

## 2016-02-14 NOTE — ED Notes (Signed)
Pt post double masectomy with one drain still in place with drainage noted. Pt supposed to see surgeon this am but started having right sided chest/back pain that radiates up the middle of her back, sharp in nature. Pt noted to have bilateral incisions from surgery that are healing and intact. Incision site wnl noted to drain site.  Pt also on dialysis and gets tx MWF, has not had dialysis today.  Daughter at bedside.

## 2016-02-15 ENCOUNTER — Ambulatory Visit (INDEPENDENT_AMBULATORY_CARE_PROVIDER_SITE_OTHER): Payer: Medicare Other | Admitting: Surgery

## 2016-02-15 ENCOUNTER — Encounter: Payer: Self-pay | Admitting: Surgery

## 2016-02-15 VITALS — BP 166/100 | HR 80 | Temp 98.6°F | Ht 66.0 in | Wt 128.4 lb

## 2016-02-15 DIAGNOSIS — C50212 Malignant neoplasm of upper-inner quadrant of left female breast: Secondary | ICD-10-CM

## 2016-02-15 DIAGNOSIS — Z170 Estrogen receptor positive status [ER+]: Secondary | ICD-10-CM

## 2016-02-15 MED ORDER — MEDERMA EX GEL: 1.0000 "application " | Freq: Two times a day (BID) | CUTANEOUS | 2 refills | Status: DC

## 2016-02-15 NOTE — Patient Instructions (Addendum)
I sent your prescription for Mederma to [facility]. Please pick this up and apply twice daily.  Please follow-up in 2 weeks with Dr. Burt Knack. Your Appointment information is below.  Please call and speak with Trenna Kiely with any questions or concerns that you have prior to appointment.

## 2016-02-15 NOTE — Progress Notes (Signed)
Outpatient postop visit  02/15/2016  Teresa Cook is an 61 y.o. female.    Procedure: Bilateral total mastectomy with sentinel node biopsies  CC: Feels well  HPI: Patient's husband died recently. She was in the emergency room yesterday with vision problems and high blood pressure. She was treated and released.  Turns today for wound check and drain check. She is at 25 cc out of her JP drain over the last 24 hours. She is using Vaseline gauze on her wounds.  Medications reviewed.    Physical Exam:  There were no vitals taken for this visit.    PE: Serous fluid in the drain. Drain is removed. Dressing is placed. The bilateral mastectomy wounds are healing fairly well there is evidence of prior skin edge necrosis with secondary scarring and pigment changes. There is eschar present across the entirety of both mastectomy scars. No sign of seroma.  Calves are nontender    Assessment/Plan:  Drain is removed pathology is reviewed patient is seeing oncology. She has yet to fill her prescription given to her by Dr. Grayland Ormond.  Patient otherwise doing well at this point her wounds are healing in spite of her wound edge necrosis which is resolving. She will follow-up with me in 2 weeks.  [name], MD, FACS

## 2016-02-21 ENCOUNTER — Telehealth: Payer: Self-pay

## 2016-02-21 ENCOUNTER — Encounter: Payer: Self-pay | Admitting: General Surgery

## 2016-02-21 ENCOUNTER — Ambulatory Visit (INDEPENDENT_AMBULATORY_CARE_PROVIDER_SITE_OTHER): Payer: Medicare Other | Admitting: General Surgery

## 2016-02-21 VITALS — BP 190/120 | HR 87 | Temp 98.5°F | Ht 66.0 in | Wt 129.6 lb

## 2016-02-21 DIAGNOSIS — Z4889 Encounter for other specified surgical aftercare: Secondary | ICD-10-CM

## 2016-02-21 MED ORDER — SULFAMETHOXAZOLE-TRIMETHOPRIM 800-160 MG PO TABS: 1.0000 | ORAL_TABLET | Freq: Two times a day (BID) | ORAL | 0 refills | Status: DC

## 2016-02-21 NOTE — Patient Instructions (Signed)
We have sent your medicine to the Pharmacy. If you feel that things are getting worse  please call our office. If over the weekend you are worse go to the emergency room to be seen. Please see your follow up appointment listed below.

## 2016-02-21 NOTE — Telephone Encounter (Signed)
Having a problem in one of the sites on her mastectomy. Swollen, tender, It's leaking from somewhere. No fevers, slight nausea but no vomiting. I will make her an appointment to see Dr. Adonis Huguenin.

## 2016-02-21 NOTE — Progress Notes (Signed)
error 

## 2016-02-21 NOTE — Progress Notes (Signed)
Outpatient Surgical Follow Up  02/21/2016  Teresa Cook is an 61 y.o. female.   Chief Complaint  Patient presents with  . Routine Post Op    Bilateral Mastectomy-01/25/16-Dr.Loflin    HPI: 61 year old female returns to clinic for a post op check secondary to a new feeling of fluid in her right mastectomy site as well as new pains to that area. She reports the area has been warm but she is unsure if it has drained. She otherwise denies any fevers, chills, nausea, vomiting, constipation or diarrhea. She is just concerned that something was wrong with her surgical site.  Past Medical History:  Diagnosis Date  . Anemia   . Anxiety   . Breast cancer ([place]) 01/2016   bilateral  . CHF (congestive heart failure) ([place])   . Chronic kidney disease   . Depression   . Dialysis patient ([place])   . DVT (deep venous thrombosis) (H[facility])    left leg  . DVT (deep venous thrombosis) ([place]) 1985   right thigh  . Dysrhythmia   . Gout   . Headache   . HTN (hypertension)   . Hypertension   . Parathyroid abnormality ([place])   . Parathyroid disease ([place])   . Pneumonia 12/2015  . Psoriasis   . Renal insufficiency   . Sickle cell trait ([place])    traits    Past Surgical History:  Procedure Laterality Date  . ABDOMINAL HYSTERECTOMY  1980  . APPENDECTOMY    . BREAST BIOPSY Left 10/28/2013   benign  . BREAST EXCISIONAL BIOPSY Left 2002   benign  . INSERTION OF DIALYSIS CATHETER  2014  . LIPOMA EXCISION N/A 01/23/2016   Procedure: EXCISION LIPOMA;  Surgeon: Hubbard Robinson, MD;  Location: [facility] ORS;  Service: General;  Laterality: N/A;  . MASTECTOMY W/ SENTINEL NODE BIOPSY Bilateral 01/23/2016   Procedure: bilateral MASTECTOMY WITH  bilateral SENTINEL LYMPH NODE BIOPSY possible left axillary node dissection forehead lipoma removal;  Surgeon: Hubbard Robinson, MD;  Location: [facility] ORS;  Service: General;  Laterality: Bilateral;  . PARTIAL HYSTERECTOMY    . PERIPHERAL VASCULAR CATHETERIZATION N/A  12/25/2015   Procedure: Dialysis/Perma Catheter Insertion;  Surgeon: Algernon Huxley, MD;  Location: [place] [facility];  Service: Cardiovascular;  Laterality: N/A;  . PERIPHERAL VASCULAR CATHETERIZATION Left 01/22/2016   Procedure: Dialysis/Perma Catheter Insertion;  Surgeon: Algernon Huxley, MD;  Location: [place] [facility];  Service: Cardiovascular;  Laterality: Left;  . PERIPHERAL VASCULAR CATHETERIZATION N/A 01/26/2016   Procedure: Dialysis/Perma Catheter Insertion;  Surgeon: Katha Cabal, MD;  Location: [place] [facility];  Service: Cardiovascular;  Laterality: N/A;  . PORT-A-CATH REMOVAL N/A 12/20/2015   Procedure: REMOVAL PORT-A-CATH;  Surgeon: Hubbard Robinson, MD;  Location: [facility];  Service: General;  Laterality: N/A;  left    . PORTACATH PLACEMENT Left 08/21/2015   Procedure: INSERTION PORT-A-CATH;  Surgeon: Hubbard Robinson, MD;  Location: [facility];  Service: General;  Laterality: Left;  . REMOVAL OF A DIALYSIS CATHETER  2017    Family History  Problem Relation Age of Onset  . Stroke Mother   . CVA Mother   . Hypertension Mother   . Hypertension Father   . Hypertension Sister   . Diabetes Sister   . Cancer Sister 48    Breast  . Stroke Brother   . Hypertension Brother   . Breast cancer Maternal Aunt 70    Social History:  reports that she has never smoked.  She has never used smokeless tobacco. She reports that she does not drink alcohol or use drugs.  Allergies:  Allergies  Allergen Reactions  . Gabapentin Other (See Comments)    Seizure  . Adhesive [Tape] Itching    Silk tape is ok to use.    Medications reviewed.    ROS A multipoint ROS was completed, all pertinent positives and negatives are documented in the HPI and the remainder are negative.   BP (!) 190/120   Pulse 87   Temp 98.5 F (36.9 C) (Oral)   Ht 5\' 6"  (1.676 m)   Wt 58.8 kg (129 lb 9.6 oz)   BMI 20.92 kg/m   Physical Exam Gen NAD Neck: supple and NT Breast: Bilateral  breasts are surgically absent. Both surgical sites with evidence of skin slough from thermal injury. Left side with significant dogear but no evidence of infection of fluid collection. Right side with obvious fluid pocket at the mid point of the lower flap measuring approx 4x3cm. The area is warm but without obvious cellulitis and no evidence of drainage. Chest: CTA CV: RRR Abd; Soft, NT    No results found for this or any previous visit (from the past 48 hour(s)). No results found.  Assessment/Plan:  1. Aftercare following surgery 61 y/o female s/p bilateral mastectomy now with a right sided seroma. Due to warmth and pain will start on Bactrim. Discussed signs of infection and for her to return to clinic ASAP should they occur. Otherwise she will follow up in clinic next week for an additional wound check.     Clayburn Pert, MD FACS General Surgeon  02/21/2016,5:41 PM

## 2016-02-28 ENCOUNTER — Emergency Department
Admission: EM | Admit: 2016-02-28 | Discharge: 2016-02-29 | Disposition: A | Discharge status: Home / Self Care | Payer: Medicare Other | Source: Home / Self Care | Attending: Emergency Medicine | Admitting: Emergency Medicine

## 2016-02-28 DIAGNOSIS — F331 Major depressive disorder, recurrent, moderate: Secondary | ICD-10-CM | POA: Diagnosis not present

## 2016-02-28 DIAGNOSIS — Z79899 Other long term (current) drug therapy: Secondary | ICD-10-CM | POA: Diagnosis not present

## 2016-02-28 DIAGNOSIS — I509 Heart failure, unspecified: Secondary | ICD-10-CM | POA: Insufficient documentation

## 2016-02-28 DIAGNOSIS — I120 Hypertensive chronic kidney disease with stage 5 chronic kidney disease or end stage renal disease: Secondary | ICD-10-CM | POA: Diagnosis not present

## 2016-02-28 DIAGNOSIS — F410 Panic disorder [episodic paroxysmal anxiety] without agoraphobia: Secondary | ICD-10-CM | POA: Diagnosis not present

## 2016-02-28 DIAGNOSIS — I10 Essential (primary) hypertension: Secondary | ICD-10-CM

## 2016-02-28 DIAGNOSIS — Z853 Personal history of malignant neoplasm of breast: Secondary | ICD-10-CM | POA: Insufficient documentation

## 2016-02-28 DIAGNOSIS — R45851 Suicidal ideations: Secondary | ICD-10-CM | POA: Diagnosis not present

## 2016-02-28 DIAGNOSIS — N186 End stage renal disease: Secondary | ICD-10-CM

## 2016-02-28 DIAGNOSIS — Z66 Do not resuscitate: Secondary | ICD-10-CM | POA: Diagnosis not present

## 2016-02-28 DIAGNOSIS — I132 Hypertensive heart and chronic kidney disease with heart failure and with stage 5 chronic kidney disease, or end stage renal disease: Secondary | ICD-10-CM | POA: Insufficient documentation

## 2016-02-28 DIAGNOSIS — Z7901 Long term (current) use of anticoagulants: Secondary | ICD-10-CM | POA: Diagnosis not present

## 2016-02-28 DIAGNOSIS — Z992 Dependence on renal dialysis: Secondary | ICD-10-CM | POA: Diagnosis not present

## 2016-02-28 DIAGNOSIS — Z86718 Personal history of other venous thrombosis and embolism: Secondary | ICD-10-CM | POA: Diagnosis not present

## 2016-02-28 DIAGNOSIS — R569 Unspecified convulsions: Secondary | ICD-10-CM | POA: Diagnosis not present

## 2016-02-28 DIAGNOSIS — R030 Elevated blood-pressure reading, without diagnosis of hypertension: Secondary | ICD-10-CM | POA: Diagnosis present

## 2016-02-28 DIAGNOSIS — E876 Hypokalemia: Secondary | ICD-10-CM | POA: Diagnosis not present

## 2016-02-28 DIAGNOSIS — K219 Gastro-esophageal reflux disease without esophagitis: Secondary | ICD-10-CM | POA: Diagnosis not present

## 2016-02-28 DIAGNOSIS — Z79811 Long term (current) use of aromatase inhibitors: Secondary | ICD-10-CM | POA: Diagnosis not present

## 2016-02-28 LAB — CBC
HEMATOCRIT: 25.2 % — AB (ref 35.0–47.0)
HEMOGLOBIN: 8.6 g/dL — AB (ref 12.0–16.0)
MCH: 28.9 pg (ref 26.0–34.0)
MCHC: 34 g/dL (ref 32.0–36.0)
MCV: 85 fL (ref 80.0–100.0)
Platelets: 134 10*3/uL — ABNORMAL LOW (ref 150–440)
RBC: 2.97 MIL/uL — ABNORMAL LOW (ref 3.80–5.20)
RDW: 16.1 % — AB (ref 11.5–14.5)
WBC: 4.2 10*3/uL (ref 3.6–11.0)

## 2016-02-28 LAB — BASIC METABOLIC PANEL
Anion gap: 10 (ref 5–15)
BUN: 14 mg/dL (ref 6–20)
CO2: 22 mmol/L (ref 22–32)
Calcium: 9.7 mg/dL (ref 8.9–10.3)
Chloride: 100 mmol/L — ABNORMAL LOW (ref 101–111)
Creatinine, Ser: 3.49 mg/dL — ABNORMAL HIGH (ref 0.44–1.00)
GFR calc Af Amer: 15 mL/min — ABNORMAL LOW (ref >60)
GFR, EST NON AFRICAN AMERICAN: 13 mL/min — AB (ref >60)
GLUCOSE: 99 mg/dL (ref 65–99)
Potassium: 3.3 mmol/L — ABNORMAL LOW (ref 3.5–5.1)
Sodium: 132 mmol/L — ABNORMAL LOW (ref 135–145)

## 2016-02-28 MED ORDER — DIPHENHYDRAMINE HCL 50 MG/ML IJ SOLN
INTRAMUSCULAR | Status: AC
  Administered 2016-02-28: 50 mg via INTRAVENOUS
  Filled 2016-02-28: qty 1

## 2016-02-28 MED ORDER — LORAZEPAM 2 MG/ML IJ SOLN
INTRAMUSCULAR | Status: AC
  Administered 2016-02-28: 1 mg via INTRAVENOUS
  Filled 2016-02-28: qty 1

## 2016-02-28 MED ORDER — PROCHLORPERAZINE EDISYLATE 5 MG/ML IJ SOLN
10.0000 mg | Freq: Once | INTRAMUSCULAR | Status: AC
Start: 2016-02-28 — End: 2016-02-28
  Administered 2016-02-28: 10 mg via INTRAVENOUS
  Filled 2016-02-28: qty 2

## 2016-02-28 MED ORDER — DIPHENHYDRAMINE HCL 50 MG/ML IJ SOLN
50.0000 mg | Freq: Once | INTRAMUSCULAR | Status: AC
  Administered 2016-02-28: 50 mg via INTRAVENOUS

## 2016-02-28 MED ORDER — DIPHENHYDRAMINE HCL 50 MG/ML IJ SOLN
25.0000 mg | Freq: Once | INTRAMUSCULAR | Status: AC
  Administered 2016-02-28: 25 mg via INTRAVENOUS
  Filled 2016-02-28: qty 1

## 2016-02-28 MED ORDER — LORAZEPAM 2 MG/ML IJ SOLN
1.0000 mg | Freq: Once | INTRAMUSCULAR | Status: AC
  Administered 2016-02-28: 1 mg via INTRAVENOUS

## 2016-02-28 NOTE — ED Provider Notes (Addendum)
[facility] Emergency Department Provider Note  ____________________________________________   First MD Initiated Contact with Patient 02/28/16 2017     (approximate)  I have reviewed the triage vital signs and the nursing notes.   HISTORY  Chief Complaint Hypertension and Headache    HPI Teresa Cook is a 61 y.o. female who comes to the emergency department via EMS for multiple issues. She has a past medical history end-stage renal disease for which she receives hemodialysis via a catheter in her left upper extremity. Today she received a full run of dialysis but afterwards her blood pressure was slightly elevated. She is also noted gradual onset and not maximal onset bifrontal throbbing aching nonradiating headache similar to previous headaches. She also says that for the past week or so she's had decreased appetite and hasn't wanted to drink much fluids. December 2017 she had bilateral mastectomy in 1 week ago she had a pocket in her right chest for which she took a course of Bactrim. She's had several loose stools a day after initiating the Bactrim. She is tearful during history stating she is having issues at home with her husband and she is frustratedthat she is continually ill.   Past Medical History:  Diagnosis Date  . Anemia   . Anxiety   . Breast cancer ([place]) 01/2016   bilateral  . CHF (congestive heart failure) ([place])   . Chronic kidney disease   . Depression   . Dialysis patient ([place])   . DVT (deep venous thrombosis) (H[facility])    left leg  . DVT (deep venous thrombosis) ([place]) 1985   right thigh  . Dysrhythmia   . Gout   . Headache   . HTN (hypertension)   . Hypertension   . Parathyroid abnormality ([place])   . Parathyroid disease ([place])   . Pneumonia 12/2015  . Psoriasis   . Renal insufficiency   . Sickle cell trait ([place])    traits    Patient Active Problem List   Diagnosis Date Noted  . Scalp lesion   . Breast cancer ([place])  01/23/2016  . ESRD (end stage renal disease) ([place]) 01/22/2016  . Severe anemia 01/01/2016  . Seizure ([place]) 12/31/2015  . Healthcare-associated pneumonia   . Bloodstream infection due to port-a-cath, initial encounter   . Sepsis ([place]) 12/15/2015  . Chest pain 08/31/2015  . SIRS (systemic inflammatory response syndrome) ([place]) 08/27/2015  . Malignant neoplasm of upper-inner quadrant of left breast in female, estrogen receptor positive ([place]) 08/05/2015  . Pulmonary edema 07/09/2015  . Dizziness 03/03/2015  . Seizures ([place]) 03/03/2015  . Spells 11/11/2014  . Acute respiratory failure with hypoxia ([place]) 09/19/2014  . Left ventricular dysfunction 09/06/2014  . Acute pulmonary edema ([place]) 08/30/2014  . Respiratory failure ([place]) 08/29/2014  . Respiratory difficulty 08/29/2014  . Chronic systolic heart failure ([place]) 05/16/2014  . Hypertension 05/16/2014  . Renal failure 05/16/2014  . Anxiety 05/16/2014  . Sickle cell trait ([place]) 03/03/2014  . ESRD on dialysis ([place]) 11/02/2013  . Dysgeusia 01/27/2013  . Weight loss 01/27/2013  . Resting tremor 08/13/2012  . Depression 04/30/2012  . 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 Right leg DVT ([place]) 04/30/2012    Past Surgical History:  Procedure Laterality Date  . ABDOMINAL HYSTERECTOMY  1980  . APPENDECTOMY    . BREAST BIOPSY Left 10/28/2013   benign  . BREAST EXCISIONAL BIOPSY Left 2002   benign  . INSERTION OF DIALYSIS CATHETER  2014  . LIPOMA EXCISION  N/A 01/23/2016   Procedure: EXCISION LIPOMA;  Surgeon: Hubbard Robinson, MD;  Location: [facility] ORS;  Service: General;  Laterality: N/A;  . MASTECTOMY W/ SENTINEL NODE BIOPSY Bilateral 01/23/2016   Procedure: bilateral MASTECTOMY WITH  bilateral SENTINEL LYMPH NODE BIOPSY possible left axillary node dissection forehead lipoma removal;  Surgeon: Hubbard Robinson, MD;  Location: [facility] ORS;  Service: General;  Laterality: Bilateral;  . PARTIAL HYSTERECTOMY    . PERIPHERAL  VASCULAR CATHETERIZATION N/A 12/25/2015   Procedure: Dialysis/Perma Catheter Insertion;  Surgeon: Algernon Huxley, MD;  Location: [place] [facility];  Service: Cardiovascular;  Laterality: N/A;  . PERIPHERAL VASCULAR CATHETERIZATION Left 01/22/2016   Procedure: Dialysis/Perma Catheter Insertion;  Surgeon: Algernon Huxley, MD;  Location: [place] [facility];  Service: Cardiovascular;  Laterality: Left;  . PERIPHERAL VASCULAR CATHETERIZATION N/A 01/26/2016   Procedure: Dialysis/Perma Catheter Insertion;  Surgeon: Katha Cabal, MD;  Location: [place] [facility];  Service: Cardiovascular;  Laterality: N/A;  . PORT-A-CATH REMOVAL N/A 12/20/2015   Procedure: REMOVAL PORT-A-CATH;  Surgeon: Hubbard Robinson, MD;  Location: [facility] ORS;  Service: General;  Laterality: N/A;  left    . PORTACATH PLACEMENT Left 08/21/2015   Procedure: INSERTION PORT-A-CATH;  Surgeon: Hubbard Robinson, MD;  Location: [facility] ORS;  Service: General;  Laterality: Left;  . REMOVAL OF A DIALYSIS CATHETER  2017    Prior to Admission medications   Medication Sig Start Date End Date Taking? Authorizing Provider  acetaminophen (TYLENOL) 325 MG tablet Take 2 tablets (650 mg total) by mouth every 6 (six) hours as needed for mild pain (or Fever >/= 101). 12/27/15   [name], MD  ALPRAZolam Duanne Moron) 0.5 MG tablet Take 1 tablet (0.5 mg total) by mouth 2 (two) times daily as needed for anxiety. 12/27/15   [name], MD  feeding supplement, ENSURE ENLIVE, (ENSURE ENLIVE) LIQD Take 237 mLs by mouth 3 (three) times daily between meals. Patient taking differently: Take 237 mLs by mouth daily.  12/27/15   [name], MD  letrozole (FEMARA) 2.5 MG tablet Take 1 tablet (2.5 mg total) by mouth daily. 02/06/16   [name], MD  levETIRAcetam (KEPPRA) 500 MG tablet Take 1 tablet (500 mg total) by mouth 2 (two) times daily. Patient taking differently: Take 500 mg by mouth daily.  01/03/16   Max Sane, MD  losartan (COZAAR) 50 MG tablet  Take 100 mg by mouth See admin instructions. Takes 100 MG DAILY on non-dialysis days=Tuesday, Thursday, Saturday and Sunday. 09/07/14   Historical Provider, MD  metoprolol (LOPRESSOR) 100 MG tablet Take 0.5 tablets (50 mg total) by mouth 2 (two) times daily. Takes on non-dialysis days=Tuesday, Thursday, Saturday and Sunday. Patient taking differently: Take 100 mg by mouth See admin instructions. Takes 100 MG DAILY on non-dialysis days=Tuesday, Thursday, Saturday and Sunday. 12/27/15   [name], MD  pantoprazole (PROTONIX) 40 MG tablet Take 40 mg by mouth as needed.     Historical Provider, MD  Scar Treatment Products [facility]) GEL Apply 1 application topically 2 (two) times daily. 02/15/16   [name], MD  sertraline (ZOLOFT) 100 MG tablet Take 100 mg by mouth daily.  03/05/12   Historical Provider, MD  sulfamethoxazole-trimethoprim (BACTRIM DS,SEPTRA DS) 800-160 MG tablet Take 1 tablet by mouth 2 (two) times daily. 02/21/16   Clayburn Pert, MD  traMADol (ULTRAM) 50 MG tablet Take 1 tablet (50 mg total) by mouth every 6 (six) hours as needed for moderate pain. 02/14/16  Merlyn Lot, MD  warfarin (COUMADIN) 5 MG tablet Take 1 tablet (5 MG )Friday, Saturday and Sundays. Take 1 1/2 tablets (7.5 MG) the other days of the week 11/03/13   Historical Provider, MD  zolpidem (AMBIEN) 10 MG tablet Take 10 mg by mouth at bedtime.     Historical Provider, MD    Allergies Gabapentin and Adhesive [tape]  Family History  Problem Relation Age of Onset  . Stroke Mother   . CVA Mother   . Hypertension Mother   . Hypertension Father   . Hypertension Sister   . Diabetes Sister   . Cancer Sister 47    Breast  . Stroke Brother   . Hypertension Brother   . Breast cancer Maternal Aunt 70    Social History Social History  Substance Use Topics  . Smoking status: Never Smoker  . Smokeless tobacco: Never Used  . Alcohol use No    Review of Systems Constitutional: No fever/chills Eyes: No  visual changes. ENT: No sore throat. Cardiovascular: Denies chest pain. Respiratory: Denies shortness of breath. Gastrointestinal: No abdominal pain.  No nausea, no vomiting.  No diarrhea.  No constipation. Genitourinary: Negative for dysuria. Musculoskeletal: Negative for back pain. Skin: Negative for rash. Neurological: Positive for headaches, focal weakness or numbness.  10-point ROS otherwise negative.  ____________________________________________   PHYSICAL EXAM:  VITAL SIGNS: ED Triage Vitals  Enc Vitals Group     BP      Pulse      Resp      Temp      Temp src      SpO2      Weight      Height      Head Circumference      Peak Flow      Pain Score      Pain Loc      Pain Edu?      Excl. in [place]?     Constitutional: Alert and oriented. Well appearing and in no acute distress. Eyes: Conjunctivae are normal. PERRL. EOMI. Head: Atraumatic. Nose: No congestion/rhinnorhea. Mouth/Throat: Mucous membranes are moist.  Oropharynx non-erythematous. Neck: No stridor.   Cardiovascular: Normal rate, regular rhythm. Grossly normal heart sounds.  Good peripheral circulation. Respiratory: Normal respiratory effort.  No retractions. Lungs CTAB. Gastrointestinal: Soft and nontender. No distention. No abdominal bruits. No CVA tenderness. Musculoskeletal: No lower extremity tenderness nor edema.  No joint effusions. Neurologic:  Normal speech and language. No gross focal neurologic deficits are appreciated. No gait instability. Skin:  Skin is warm, dry and intact. No rash noted. Psychiatric: Mood and affect are normal. Speech and behavior are normal.  ____________________________________________   LABS (all labs ordered are listed, but only abnormal results are displayed)  Labs Reviewed  BASIC METABOLIC PANEL - Abnormal; Notable for the following:       Result Value   Sodium 132 (*)    Potassium 3.3 (*)    Chloride 100 (*)    Creatinine, Ser 3.49 (*)    GFR calc non Af  Amer 13 (*)    GFR calc Af Amer 15 (*)    All other components within normal limits  CBC - Abnormal; Notable for the following:    RBC 2.97 (*)    Hemoglobin 8.6 (*)    HCT 25.2 (*)    RDW 16.1 (*)    Platelets 134 (*)    All other components within normal limits  URINALYSIS, COMPLETE (UACMP) WITH MICROSCOPIC  CBG MONITORING,  ED   ____________________________________________  EKG  ED ECG REPORT I, Darel Hong, the attending physician, personally viewed and interpreted this ECG.  Date: 03/14/2016 Rate: 71 Rhythm: normal sinus rhythm QRS Axis: normal Intervals: Prolonged QTC ST/T Wave abnormalities: Nonspecific ST changes without frank elevation Conduction Disturbances: none Narrative Interpretation: Abnormal  ____________________________________________  RADIOLOGY   ____________________________________________   PROCEDURES  Procedure(s) performed: no  Procedures  Critical Care performed: no  ____________________________________________   INITIAL IMPRESSION / ASSESSMENT AND PLAN / ED COURSE  Pertinent labs & imaging results that were available during my care of the patient were reviewed by me and considered in my medical decision making (see chart for details).  On arrival the patient is well-appearing with a host of vague complaints. Most importantly her blood pressure is not headache with Compazine and Benadryl and reevaluate  Unfortunately the patient developed akinesthesia after the prochlorperazine and give her another dose of Benadryl and she feels improved. At this point she would like to go home and is medically stable for outpatient management.   ____________________________________________   FINAL CLINICAL IMPRESSION(S) / ED DIAGNOSES  Final diagnoses:  Hypertension, unspecified type  ESRD (end stage renal disease) ([place])      NEW MEDICATIONS STARTED DURING THIS VISIT:  New Prescriptions   No medications on file     Note:  This  document was prepared using Dragon voice recognition software and may include unintentional dictation errors.     Darel Hong, MD 02/28/16 [place], MD 03/14/16 1039

## 2016-02-28 NOTE — Discharge Instructions (Signed)
Please take all of the medications as prescribed and follow up with her primary care physician as scheduled. Return to the emergency department for any new or worsening symptoms.

## 2016-02-28 NOTE — ED Triage Notes (Signed)
Pt arrives via [facility] from home for HTN and HA. Pt is a dialysis pt who goes MWF, received treatment today. Pt states posterior HA that began after dialysis. PT states BP was high on Monday at treatment and high again today. Pt received clonidine and half of her metoprolol dose today after dialysis. FD BP was 180/116, EMS 220/122. Pt also has seizures- last few months ago, started on new medication but hasn't taken a dose yet.  Pt had recent bilat mastectomy, done with breast cancer treatment, had pocket after surgery so started on sulfa antibiotic. Pt denies blurred vision or dizziness. Pt states diarrhea as well. Alert and oriented x 4, ambulatory.

## 2016-02-28 NOTE — ED Notes (Signed)
Pt states she is feeling like a baby rattle is rattling inside her. Dr. Arville Go aware.

## 2016-02-29 ENCOUNTER — Encounter: Payer: Self-pay | Admitting: Surgery

## 2016-02-29 ENCOUNTER — Observation Stay
Admission: AD | Admit: 2016-02-29 | Discharge: 2016-03-01 | Disposition: A | Discharge status: Home / Self Care | Payer: Medicare Other | Source: Ambulatory Visit | Attending: Internal Medicine | Admitting: Internal Medicine

## 2016-02-29 ENCOUNTER — Ambulatory Visit (INDEPENDENT_AMBULATORY_CARE_PROVIDER_SITE_OTHER): Payer: Medicare Other | Admitting: Surgery

## 2016-02-29 VITALS — BP 170/119 | HR 79 | Temp 98.3°F | Ht 66.0 in | Wt 120.0 lb

## 2016-02-29 DIAGNOSIS — F331 Major depressive disorder, recurrent, moderate: Secondary | ICD-10-CM | POA: Diagnosis not present

## 2016-02-29 DIAGNOSIS — R569 Unspecified convulsions: Secondary | ICD-10-CM | POA: Insufficient documentation

## 2016-02-29 DIAGNOSIS — I10 Essential (primary) hypertension: Secondary | ICD-10-CM | POA: Diagnosis present

## 2016-02-29 DIAGNOSIS — I120 Hypertensive chronic kidney disease with stage 5 chronic kidney disease or end stage renal disease: Principal | ICD-10-CM | POA: Insufficient documentation

## 2016-02-29 DIAGNOSIS — N186 End stage renal disease: Secondary | ICD-10-CM | POA: Insufficient documentation

## 2016-02-29 DIAGNOSIS — Z66 Do not resuscitate: Secondary | ICD-10-CM | POA: Insufficient documentation

## 2016-02-29 DIAGNOSIS — Z170 Estrogen receptor positive status [ER+]: Secondary | ICD-10-CM

## 2016-02-29 DIAGNOSIS — R45851 Suicidal ideations: Secondary | ICD-10-CM | POA: Insufficient documentation

## 2016-02-29 DIAGNOSIS — Z7901 Long term (current) use of anticoagulants: Secondary | ICD-10-CM | POA: Insufficient documentation

## 2016-02-29 DIAGNOSIS — Z79899 Other long term (current) drug therapy: Secondary | ICD-10-CM | POA: Insufficient documentation

## 2016-02-29 DIAGNOSIS — F410 Panic disorder [episodic paroxysmal anxiety] without agoraphobia: Secondary | ICD-10-CM | POA: Insufficient documentation

## 2016-02-29 DIAGNOSIS — C50212 Malignant neoplasm of upper-inner quadrant of left female breast: Secondary | ICD-10-CM

## 2016-02-29 DIAGNOSIS — K219 Gastro-esophageal reflux disease without esophagitis: Secondary | ICD-10-CM | POA: Insufficient documentation

## 2016-02-29 DIAGNOSIS — E876 Hypokalemia: Secondary | ICD-10-CM | POA: Insufficient documentation

## 2016-02-29 DIAGNOSIS — Z992 Dependence on renal dialysis: Secondary | ICD-10-CM | POA: Insufficient documentation

## 2016-02-29 DIAGNOSIS — Z79811 Long term (current) use of aromatase inhibitors: Secondary | ICD-10-CM | POA: Insufficient documentation

## 2016-02-29 DIAGNOSIS — Z853 Personal history of malignant neoplasm of breast: Secondary | ICD-10-CM | POA: Insufficient documentation

## 2016-02-29 DIAGNOSIS — Z86718 Personal history of other venous thrombosis and embolism: Secondary | ICD-10-CM | POA: Insufficient documentation

## 2016-02-29 LAB — PROTIME-INR
INR: 2.48
Prothrombin Time: 27.3 seconds — ABNORMAL HIGH (ref 11.4–15.2)

## 2016-02-29 LAB — MRSA PCR SCREENING: MRSA by PCR: NEGATIVE

## 2016-02-29 MED ORDER — METOPROLOL TARTRATE 50 MG PO TABS
100.0000 mg | ORAL_TABLET | ORAL | Status: DC
  Filled 2016-02-29: qty 2

## 2016-02-29 MED ORDER — ACETAMINOPHEN 325 MG PO TABS: 650.0000 mg | ORAL_TABLET | Freq: Four times a day (QID) | ORAL | Status: DC | PRN

## 2016-02-29 MED ORDER — SERTRALINE HCL 50 MG PO TABS
150.0000 mg | ORAL_TABLET | Freq: Every day | ORAL | Status: DC
  Administered 2016-03-01: 150 mg via ORAL
  Filled 2016-02-29: qty 3

## 2016-02-29 MED ORDER — WARFARIN - PHYSICIAN DOSING INPATIENT: Freq: Every day | Status: DC

## 2016-02-29 MED ORDER — LOSARTAN POTASSIUM 50 MG PO TABS: 100.0000 mg | ORAL_TABLET | ORAL | Status: DC

## 2016-02-29 MED ORDER — SENNOSIDES-DOCUSATE SODIUM 8.6-50 MG PO TABS: 1.0000 | ORAL_TABLET | Freq: Every evening | ORAL | Status: DC | PRN

## 2016-02-29 MED ORDER — SODIUM CHLORIDE 0.9 % IV SOLN
INTRAVENOUS | Status: DC
  Administered 2016-02-29: 18:00:00 via INTRAVENOUS

## 2016-02-29 MED ORDER — WARFARIN SODIUM 2.5 MG PO TABS: 7.5000 mg | ORAL_TABLET | Freq: Once | ORAL | Status: DC

## 2016-02-29 MED ORDER — ACETAMINOPHEN 650 MG RE SUPP: 650.0000 mg | Freq: Four times a day (QID) | RECTAL | Status: DC | PRN

## 2016-02-29 MED ORDER — ACETAMINOPHEN 325 MG PO TABS
650.0000 mg | ORAL_TABLET | Freq: Four times a day (QID) | ORAL | Status: DC | PRN
  Administered 2016-03-01: 650 mg via ORAL
  Filled 2016-02-29: qty 2

## 2016-02-29 MED ORDER — ALPRAZOLAM 0.5 MG PO TABS: 0.5000 mg | ORAL_TABLET | Freq: Two times a day (BID) | ORAL | Status: DC | PRN

## 2016-02-29 MED ORDER — WARFARIN SODIUM 5 MG PO TABS
5.0000 mg | ORAL_TABLET | Freq: Once | ORAL | Status: AC
  Administered 2016-03-01: 5 mg via ORAL
  Filled 2016-02-29: qty 1

## 2016-02-29 MED ORDER — TRAMADOL HCL 50 MG PO TABS
50.0000 mg | ORAL_TABLET | Freq: Four times a day (QID) | ORAL | Status: DC | PRN
  Administered 2016-02-29 – 2016-03-01 (×3): 50 mg via ORAL
  Filled 2016-02-29 (×3): qty 1

## 2016-02-29 MED ORDER — LETROZOLE 2.5 MG PO TABS
2.5000 mg | ORAL_TABLET | Freq: Every day | ORAL | Status: DC
  Filled 2016-02-29 (×4): qty 1

## 2016-02-29 MED ORDER — TEMAZEPAM 15 MG PO CAPS
15.0000 mg | ORAL_CAPSULE | Freq: Every day | ORAL | Status: DC
  Administered 2016-02-29: 15 mg via ORAL
  Filled 2016-02-29: qty 1

## 2016-02-29 MED ORDER — ENSURE ENLIVE PO LIQD
237.0000 mL | Freq: Every day | ORAL | Status: DC
  Administered 2016-02-29: 237 mL via ORAL

## 2016-02-29 MED ORDER — HEPARIN SODIUM (PORCINE) 5000 UNIT/ML IJ SOLN
5000.0000 [IU] | Freq: Two times a day (BID) | INTRAMUSCULAR | Status: DC
  Administered 2016-02-29: 5000 [IU] via SUBCUTANEOUS
  Filled 2016-02-29: qty 1

## 2016-02-29 MED ORDER — LEVETIRACETAM 500 MG PO TABS
500.0000 mg | ORAL_TABLET | Freq: Every day | ORAL | Status: DC
  Administered 2016-02-29: 500 mg via ORAL
  Filled 2016-02-29 (×2): qty 1

## 2016-02-29 MED ORDER — ENOXAPARIN SODIUM 40 MG/0.4ML ~~LOC~~ SOLN: 40.0000 mg | SUBCUTANEOUS | Status: DC

## 2016-02-29 MED ORDER — PANTOPRAZOLE SODIUM 40 MG PO TBEC
40.0000 mg | DELAYED_RELEASE_TABLET | Freq: Every day | ORAL | Status: DC
  Administered 2016-02-29 – 2016-03-01 (×2): 40 mg via ORAL
  Filled 2016-02-29 (×2): qty 1

## 2016-02-29 MED ORDER — SERTRALINE HCL 50 MG PO TABS: 100.0000 mg | ORAL_TABLET | Freq: Every day | ORAL | Status: DC

## 2016-02-29 MED ORDER — ONDANSETRON HCL 4 MG PO TABS: 4.0000 mg | ORAL_TABLET | Freq: Four times a day (QID) | ORAL | Status: DC | PRN

## 2016-02-29 MED ORDER — ONDANSETRON HCL 4 MG/2ML IJ SOLN: 4.0000 mg | Freq: Four times a day (QID) | INTRAMUSCULAR | Status: DC | PRN

## 2016-02-29 MED ORDER — SULFAMETHOXAZOLE-TRIMETHOPRIM 800-160 MG PO TABS: 1.0000 | ORAL_TABLET | Freq: Two times a day (BID) | ORAL | Status: DC

## 2016-02-29 MED ORDER — HYDRALAZINE HCL 20 MG/ML IJ SOLN
10.0000 mg | Freq: Four times a day (QID) | INTRAMUSCULAR | Status: DC | PRN
  Administered 2016-02-29: 10 mg via INTRAVENOUS
  Filled 2016-02-29: qty 1

## 2016-02-29 NOTE — Progress Notes (Signed)
Direct Admit from Dr. Antionette Char office.  Notified surgeon on call (Dr. Thurman Coyer) for admitting orders, hypertension (See Flow sheet) and suicidal ideation.    No new orders at this time.  Will continue monitor.    Nurse Tech pulled to sit in room with patient for safety.

## 2016-02-29 NOTE — Consult Note (Signed)
[facility] Consult   Reason for Consult:  Consult for 61 year old woman with multiple medical problems including history of breast cancer and renal failure on dialysis. Consult because of expressed suicidal ideation Referring Physician:  Mody Patient Identification: Teresa Cook MRN:  [id number] Principal Diagnosis: Moderate recurrent major depression ([place]) Diagnosis:   Patient Active Problem List   Diagnosis Date Noted  . HTN (hypertension) [I10] 02/29/2016  . Moderate recurrent major depression ([place]) [F33.1] 02/29/2016  . Panic attacks [F41.0] 02/29/2016  . Scalp lesion [L98.9]   . Breast cancer ([place]) [C50.919] 01/23/2016  . ESRD (end stage renal disease) ([place]) [N18.6] 01/22/2016  . Severe anemia [D64.9] 01/01/2016  . Seizure ([place]) [R56.9] 12/31/2015  . Healthcare-associated pneumonia [J18.9]   . Bloodstream infection due to port-a-cath, initial encounter [T80.211A]   . Sepsis ([place]) [A41.9] 12/15/2015  . Chest pain [R07.9] 08/31/2015  . SIRS (systemic inflammatory response syndrome) (HCC) [R65.10] 08/27/2015  . Malignant neoplasm of upper-inner quadrant of left breast in female, estrogen receptor positive ([place]) [Y86.578, Z17.0] 08/05/2015  . Pulmonary edema [J81.1] 07/09/2015  . Dizziness [R42] 03/03/2015  . Seizures ([place]) [R56.9] 03/03/2015  . Spells [IMO0001] 11/11/2014  . Acute respiratory failure with hypoxia ([place]) [J96.01] 09/19/2014  . Left ventricular dysfunction [I51.9] 09/06/2014  . Acute pulmonary edema ([place]) [J81.0] 08/30/2014  . Respiratory failure ([place]) [J96.90] 08/29/2014  . Respiratory difficulty [R06.00] 08/29/2014  . Chronic systolic heart failure ([place]) [I50.22] 05/16/2014  . Hypertension [I10] 05/16/2014  . Renal failure [N19] 05/16/2014  . Anxiety [F41.9] 05/16/2014  . Sickle cell trait ([place]) [D57.3] 03/03/2014  . ESRD on dialysis ([place]) [N18.6, Z99.2] 11/02/2013  . Dysgeusia [R43.2] 01/27/2013  . Weight loss [R63.4] 01/27/2013  .  Resting tremor [R25.9] 08/13/2012  . Depression [F32.9] 04/30/2012  . 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 Right leg DVT ([place]) [I82.401] 04/30/2012    Total Time spent with patient: 1 hour  Subjective:   Teresa Cook is a 61 y.o. female patient admitted with "I am scared of being sick".  HPI:  Patient interviewed. Some information from her adult daughter in the room as well. Chart reviewed. Spoke with nursing. 61 year old woman was directly admitted to the hospital today with extreme hypertension. Patient also made suicidal statements reportedly at her doctor's office today. On interview today the patient readily admits that she is feeling bad both physically and emotionally. She is under a very large amount of stress. Not only is she on dialysis regularly and a cancer survivor and have multiple medical problems but her husband of 25 years just died last week leaving her alone at home. She has been suffering with panic attacks intermittently for years but in the last couple weeks they have become an almost daily event. She is able to gradually calm herself down but they are very disruptive to her when they do happen. Her sleep is been extremely poor probably for months. Appetite is also poor and has been bad ever since she had treatment for her cancer. She admits that she has sad thoughts much of the time and that she has had thoughts of sometimes wishing that she would die but states that she has no intention or plan whatsoever to kill her self. Patient is able to cite positive things in her life including her grandchildren and her religious faith as reasons not to kill her self. Furthermore she has motivation to continue medical care. She is on Zoloft 100 mg a day and has been for years. She  also takes alprazolam when necessary with minimal benefit.  Social history: Patient's husband died just a week ago and the funeral was this past weekend. They had been together about  25 years. Multiple children and multiple grandchildren. Patient is still driving herself to dialysis. Seems to have a very good relationship with at least some of her family.  Medical history: Multiple chronic and serious medical problems. History of breast cancer treated with radiation and surgery. End-stage renal failure on dialysis extreme hypertension.  Substance abuse history: Denies any alcohol or drug abuse currently or past. No history of substance abuse.  Past Psychiatric History: Patient has been treated for anxiety and depression for years. She says that she did see a psychiatrist about 15 years ago for anxiety but has not been seeing a psychiatrist or therapist any time recently. No history of psychiatric hospitalization. No history of suicide attempts or violence. No psychosis.  Risk to Self: Is patient at risk for suicide?: Yes Risk to Others:   Prior Inpatient Therapy:   Prior Outpatient Therapy:    Past Medical History:  Past Medical History:  Diagnosis Date  . Anemia   . Anxiety   . Breast cancer ([place]) 01/2016   bilateral  . CHF (congestive heart failure) ([place])   . Chronic kidney disease   . Depression   . Dialysis patient ([place])   . DVT (deep venous thrombosis) (HCC)    left leg  . DVT (deep venous thrombosis) ([place]) 1985   right thigh  . Dysrhythmia   . Gout   . Headache   . HTN (hypertension)   . Hypertension   . Parathyroid abnormality ([place])   . Parathyroid disease ([place])   . Pneumonia 12/2015  . Psoriasis   . Renal insufficiency   . Sickle cell trait ([place])    traits    Past Surgical History:  Procedure Laterality Date  . ABDOMINAL HYSTERECTOMY  1980  . APPENDECTOMY    . BREAST BIOPSY Left 10/28/2013   benign  . BREAST EXCISIONAL BIOPSY Left 2002   benign  . INSERTION OF DIALYSIS CATHETER  2014  . LIPOMA EXCISION N/A 01/23/2016   Procedure: EXCISION LIPOMA;  Surgeon: Hubbard Robinson, MD;  Location: [facility] ORS;  Service: General;  Laterality: N/A;   . MASTECTOMY W/ SENTINEL NODE BIOPSY Bilateral 01/23/2016   Procedure: bilateral MASTECTOMY WITH  bilateral SENTINEL LYMPH NODE BIOPSY possible left axillary node dissection forehead lipoma removal;  Surgeon: Hubbard Robinson, MD;  Location: [facility];  Service: General;  Laterality: Bilateral;  . PARTIAL HYSTERECTOMY    . PERIPHERAL VASCULAR CATHETERIZATION N/A 12/25/2015   Procedure: Dialysis/Perma Catheter Insertion;  Surgeon: Algernon Huxley, MD;  Location: [place] [facility];  Service: Cardiovascular;  Laterality: N/A;  . PERIPHERAL VASCULAR CATHETERIZATION Left 01/22/2016   Procedure: Dialysis/Perma Catheter Insertion;  Surgeon: Algernon Huxley, MD;  Location: [place] [facility];  Service: Cardiovascular;  Laterality: Left;  . PERIPHERAL VASCULAR CATHETERIZATION N/A 01/26/2016   Procedure: Dialysis/Perma Catheter Insertion;  Surgeon: Katha Cabal, MD;  Location: [place] [facility];  Service: Cardiovascular;  Laterality: N/A;  . PORT-A-CATH REMOVAL N/A 12/20/2015   Procedure: REMOVAL PORT-A-CATH;  Surgeon: Hubbard Robinson, MD;  Location: [facility];  Service: General;  Laterality: N/A;  left    . PORTACATH PLACEMENT Left 08/21/2015   Procedure: INSERTION PORT-A-CATH;  Surgeon: Hubbard Robinson, MD;  Location: [facility];  Service: General;  Laterality: Left;  . REMOVAL OF A DIALYSIS CATHETER  2017   Family History:  Family History  Problem Relation Age of Onset  . Stroke Mother   . CVA Mother   . Hypertension Mother   . Hypertension Father   . Hypertension Sister   . Diabetes Sister   . Cancer Sister 5    Breast  . Stroke Brother   . Hypertension Brother   . Breast cancer Maternal Aunt 70   Family Psychiatric  History: She has a nephew with schizophrenia and one son who is autistic no family history of suicide Social History:  History  Alcohol Use No     History  Drug Use No    Social History   Social History  . Marital status: Widowed    Spouse name: N/A  .  Number of children: N/A  . Years of education: N/A   Occupational History  . disabled    Social History Main Topics  . Smoking status: Never Smoker  . Smokeless tobacco: Never Used  . Alcohol use No  . Drug use: No  . Sexual activity: No   Other Topics Concern  . None   Social History Narrative   ** Merged History Encounter **       Additional Social History:    Allergies:   Allergies  Allergen Reactions  . Gabapentin Other (See Comments)    Seizure  . Adhesive [Tape] Itching    Silk tape is ok to use.    Labs:  Results for orders placed or performed during the hospital encounter of 02/29/16 (from the past 48 hour(s))  Protime-INR     Status: Abnormal   Collection Time: 02/29/16  6:06 PM  Result Value Ref Range   Prothrombin Time 27.3 (H) 11.4 - 15.2 seconds   INR 2.48     Current Facility-Administered Medications  Medication Dose Route Frequency Provider Last Rate Last Dose  . 0.9 %  sodium chloride infusion   Intravenous Continuous Bettey Costa, MD 50 mL/hr at 02/29/16 1803    . acetaminophen (TYLENOL) tablet 650 mg  650 mg Oral Q6H PRN Bettey Costa, MD       Or  . acetaminophen (TYLENOL) suppository 650 mg  650 mg Rectal Q6H PRN Bettey Costa, MD      . ALPRAZolam Duanne Moron) tablet 0.5 mg  0.5 mg Oral BID PRN Bettey Costa, MD      . feeding supplement (ENSURE ENLIVE) (ENSURE ENLIVE) liquid 237 mL  237 mL Oral Daily Bettey Costa, MD   237 mL at 02/29/16 1804  . heparin injection 5,000 Units  5,000 Units Subcutaneous Q12H Sital Mody, MD      . hydrALAZINE (APRESOLINE) injection 10 mg  10 mg Intravenous Q6H PRN Bettey Costa, MD   10 mg at 02/29/16 1804  . letrozole [facility]) tablet 2.5 mg  2.5 mg Oral Daily Bettey Costa, MD   Stopped at 02/29/16 1747  . levETIRAcetam (KEPPRA) tablet 500 mg  500 mg Oral Daily Bettey Costa, MD   500 mg at 02/29/16 1804  . losartan (COZAAR) tablet 100 mg  100 mg Oral Q T,Th,S,Su-1800 Sital Mody, MD      . metoprolol (LOPRESSOR) tablet 100 mg  100 mg  Oral Q T,Th,S,Su Sital Mody, MD      . ondansetron (ZOFRAN) tablet 4 mg  4 mg Oral Q6H PRN Bettey Costa, MD       Or  . ondansetron (ZOFRAN) injection 4 mg  4 mg Intravenous Q6H PRN Bettey Costa, MD      .  pantoprazole (PROTONIX) EC tablet 40 mg  40 mg Oral Daily Bettey Costa, MD   40 mg at 02/29/16 1804  . senna-docusate (Senokot-S) tablet 1 tablet  1 tablet Oral QHS PRN Bettey Costa, MD      . Derrill Memo ON 03/01/2016] sertraline (ZOLOFT) tablet 150 mg  150 mg Oral Daily Gonzella Lex, MD      . sulfamethoxazole-trimethoprim (BACTRIM DS,SEPTRA DS) 800-160 MG per tablet 1 tablet  1 tablet Oral BID Sital Mody, MD      . temazepam (RESTORIL) capsule 15 mg  15 mg Oral QHS Gonzella Lex, MD      . traMADol Veatrice Bourbon) tablet 50 mg  50 mg Oral Q6H PRN Bettey Costa, MD   50 mg at 02/29/16 1804    Musculoskeletal: Strength & Muscle Tone: decreased Gait & Station: ataxic Patient leans: N/A  Psychiatric Specialty Exam: Physical Exam  Nursing note and vitals reviewed. Constitutional: She appears cachectic.  HENT:  Head: Normocephalic and atraumatic.  Eyes: Conjunctivae are normal. Pupils are equal, round, and reactive to light.  Neck: Normal range of motion.  Cardiovascular: Regular rhythm and normal heart sounds.   Respiratory: Effort normal.  GI: Soft.  Musculoskeletal: Normal range of motion.  Neurological: She is alert.  Skin: Skin is warm and dry.  Psychiatric: Judgment normal. Her affect is blunt. Her speech is delayed. She is slowed. Thought content is not paranoid. Cognition and memory are normal. She exhibits a depressed mood. She expresses suicidal ideation. She expresses no homicidal ideation. She expresses no suicidal plans.    Review of Systems  Constitutional: Positive for weight loss.  HENT: Negative.   Eyes: Negative.   Respiratory: Negative.   Cardiovascular: Negative.   Gastrointestinal: Negative.   Musculoskeletal: Negative.   Skin: Negative.   Neurological: Positive for weakness.   Psychiatric/Behavioral: Positive for depression, hallucinations and suicidal ideas. Negative for memory loss and substance abuse. The patient is nervous/anxious and has insomnia.     Blood pressure (!) 171/117, pulse 75, temperature 98.1 F (36.7 C), temperature source Oral, resp. rate 16, height 5\' 6"  (1.676 m), weight 54.3 kg (119 lb 9.6 oz), SpO2 100 %.Body mass index is 19.3 kg/m.  General Appearance: Fairly Groomed  Eye Contact:  Good  Speech:  Clear and Coherent  Volume:  Decreased  Mood:  Dysphoric  Affect:  Appropriate  Thought Process:  Goal Directed  Orientation:  Full (Time, Place, and Person)  Thought Content:  Logical  Suicidal Thoughts:  Yes.  without intent/plan  Homicidal Thoughts:  No  Memory:  Immediate;   Good Recent;   Fair Remote;   Fair  Judgement:  Fair  Insight:  Fair  Psychomotor Activity:  Decreased  Concentration:  Concentration: Fair  Recall:  [employer] of Knowledge:  Fair  Language:  Fair  Akathisia:  No  Handed:  Right  AIMS (if indicated):     Assets:  Communication Skills Desire for Improvement Housing Resilience Social Support  ADL's:  Intact  Cognition:  WNL  Sleep:        Treatment Plan Summary: Daily contact with patient to assess and evaluate symptoms and progress in treatment, Medication management and Plan 61 year old woman with multiple stresses including the recent death of a spouse. History of anxiety and depression. Admits to having made statements about wishing some time she could die but denies any intention or plan to act on suicide. She does mention to me that she has sometimes looked at her pill  bottles and thought about how she could take them but has never come close to acting on it. As noted above she has multiple positive things in her life that mitigate against it. I think the risk of suicide especially in the hospital is low. Does not need a sitter or suicide precautions. Discontinued the sitter. As far as treatment I  have made some adjustments. Patient and her daughter and I discussed pros and cons of various medication approaches. I am going to increase her sertraline to 150 mg for the anxiety and depression rather than try immediately changing to a different medicine. I will leave the Xanax in place for now. I am adding Restoril 15 mg at night for sleep. Patient complains of sleeplessness as being one of the worst and most upsetting features of her current illness. I will follow-up regularly. Did some education and supportive counseling.  Disposition: Patient does not meet criteria for psychiatric inpatient admission. Supportive therapy provided about ongoing stressors.  Alethia Berthold, MD 02/29/2016 7:02 PM

## 2016-02-29 NOTE — H&P (Addendum)
[facility] [place] NAME: Teresa Cook    MR#:  [id number]  DATE OF BIRTH:  08/24/1955  DATE OF ADMISSION:  02/29/2016  PRIMARY CARE PHYSICIAN: Murlean Iba, MD   REQUESTING/REFERRING PHYSICIAN: Dr Burt Knack  CHIEF COMPLAINT:   Elevated blood pressure ??Dehydraion suicidal thoughts HISTORY OF PRESENT ILLNESS:  Teresa Cook  is a 61 y.o. female with a known history of ESRD on HD who was seen in surgery clinical and was supposed to come to ED from evaluation of dehydration, but instead was directly admited to the hospital. She was seen in ED yesterday for elvated BP. She is also having Suicidal thoughts.   PAST MEDICAL HISTORY:   Past Medical History:  Diagnosis Date  . Anemia   . Anxiety   . Breast cancer ([place]) 01/2016   bilateral  . CHF (congestive heart failure) ([place])   . Chronic kidney disease   . Depression   . Dialysis patient ([place])   . DVT (deep venous thrombosis) (H[facility])    left leg  . DVT (deep venous thrombosis) ([place]) 1985   right thigh  . Dysrhythmia   . Gout   . Headache   . HTN (hypertension)   . Hypertension   . Parathyroid abnormality ([place])   . Parathyroid disease ([place])   . Pneumonia 12/2015  . Psoriasis   . Renal insufficiency   . Sickle cell trait ([place])    traits    PAST SURGICAL HISTORY:   Past Surgical History:  Procedure Laterality Date  . ABDOMINAL HYSTERECTOMY  1980  . APPENDECTOMY    . BREAST BIOPSY Left 10/28/2013   benign  . BREAST EXCISIONAL BIOPSY Left 2002   benign  . INSERTION OF DIALYSIS CATHETER  2014  . LIPOMA EXCISION N/A 01/23/2016   Procedure: EXCISION LIPOMA;  Surgeon: Hubbard Robinson, MD;  Location: [facility];  Service: General;  Laterality: N/A;  . MASTECTOMY W/ SENTINEL NODE BIOPSY Bilateral 01/23/2016   Procedure: bilateral MASTECTOMY WITH  bilateral SENTINEL LYMPH NODE BIOPSY possible left axillary node dissection forehead lipoma removal;  Surgeon: Hubbard Robinson, MD;   Location: [facility];  Service: General;  Laterality: Bilateral;  . PARTIAL HYSTERECTOMY    . PERIPHERAL VASCULAR CATHETERIZATION N/A 12/25/2015   Procedure: Dialysis/Perma Catheter Insertion;  Surgeon: Algernon Huxley, MD;  Location: [place] [facility];  Service: Cardiovascular;  Laterality: N/A;  . PERIPHERAL VASCULAR CATHETERIZATION Left 01/22/2016   Procedure: Dialysis/Perma Catheter Insertion;  Surgeon: Algernon Huxley, MD;  Location: [place] [facility];  Service: Cardiovascular;  Laterality: Left;  . PERIPHERAL VASCULAR CATHETERIZATION N/A 01/26/2016   Procedure: Dialysis/Perma Catheter Insertion;  Surgeon: Katha Cabal, MD;  Location: [place] [facility];  Service: Cardiovascular;  Laterality: N/A;  . PORT-A-CATH REMOVAL N/A 12/20/2015   Procedure: REMOVAL PORT-A-CATH;  Surgeon: Hubbard Robinson, MD;  Location: [facility];  Service: General;  Laterality: N/A;  left    . PORTACATH PLACEMENT Left 08/21/2015   Procedure: INSERTION PORT-A-CATH;  Surgeon: Hubbard Robinson, MD;  Location: [facility];  Service: General;  Laterality: Left;  . REMOVAL OF A DIALYSIS CATHETER  2017    SOCIAL HISTORY:   Social History  Substance Use Topics  . Smoking status: Never Smoker  . Smokeless tobacco: Never Used  . Alcohol use No    FAMILY HISTORY:   Family History  Problem Relation Age of Onset  . Stroke Mother   . CVA Mother   . Hypertension  Mother   . Hypertension Father   . Hypertension Sister   . Diabetes Sister   . Cancer Sister 68    Breast  . Stroke Brother   . Hypertension Brother   . Breast cancer Maternal Aunt 70    DRUG ALLERGIES:   Allergies  Allergen Reactions  . Gabapentin Other (See Comments)    Seizure  . Adhesive [Tape] Itching    Silk tape is ok to use.    REVIEW OF SYSTEMS:   Review of Systems  Constitutional: Negative.  Negative for chills, fever and malaise/fatigue.  HENT: Negative.  Negative for ear discharge, ear pain, hearing loss, nosebleeds and sore  throat.   Eyes: Negative.  Negative for blurred vision and pain.  Respiratory: Negative.  Negative for cough, hemoptysis, shortness of breath and wheezing.   Cardiovascular: Negative.  Negative for chest pain, palpitations and leg swelling.  Gastrointestinal: Positive for diarrhea and nausea. Negative for abdominal pain, blood in stool and vomiting.  Genitourinary: Negative.  Negative for dysuria.  Musculoskeletal: Negative.  Negative for back pain.  Skin: Negative.   Neurological: Negative for dizziness, tremors, speech change, focal weakness, seizures and headaches.  Endo/Heme/Allergies: Negative.  Does not bruise/bleed easily.  Psychiatric/Behavioral: Positive for depression and suicidal ideas. Negative for hallucinations.    MEDICATIONS AT HOME:   Prior to Admission medications   Medication Sig Start Date End Date Taking? Authorizing Provider  acetaminophen (TYLENOL) 325 MG tablet Take 2 tablets (650 mg total) by mouth every 6 (six) hours as needed for mild pain (or Fever >/= 101). 12/27/15   [name], MD  ALPRAZolam Duanne Moron) 0.5 MG tablet Take 1 tablet (0.5 mg total) by mouth 2 (two) times daily as needed for anxiety. 12/27/15   [name], MD  feeding supplement, ENSURE ENLIVE, (ENSURE ENLIVE) LIQD Take 237 mLs by mouth 3 (three) times daily between meals. Patient taking differently: Take 237 mLs by mouth daily.  12/27/15   [name], MD  letrozole (FEMARA) 2.5 MG tablet Take 1 tablet (2.5 mg total) by mouth daily. 02/06/16   [name], MD  levETIRAcetam (KEPPRA) 500 MG tablet Take 1 tablet (500 mg total) by mouth 2 (two) times daily. Patient taking differently: Take 500 mg by mouth daily.  01/03/16   Max Sane, MD  losartan (COZAAR) 50 MG tablet Take 100 mg by mouth See admin instructions. Takes 100 MG DAILY on non-dialysis days=Tuesday, Thursday, Saturday and Sunday. 09/07/14   Historical Provider, MD  metoprolol (LOPRESSOR) 100 MG tablet Take 0.5 tablets (50 mg total)  by mouth 2 (two) times daily. Takes on non-dialysis days=Tuesday, Thursday, Saturday and Sunday. Patient taking differently: Take 100 mg by mouth See admin instructions. Takes 100 MG DAILY on non-dialysis days=Tuesday, Thursday, Saturday and Sunday. 12/27/15   [name], MD  pantoprazole (PROTONIX) 40 MG tablet Take 40 mg by mouth as needed.     Historical Provider, MD  [employer] [facility]) GEL Apply 1 application topically 2 (two) times daily. 02/15/16   [name], MD  sertraline (ZOLOFT) 100 MG tablet Take 100 mg by mouth daily.  03/05/12   Historical Provider, MD  sulfamethoxazole-trimethoprim (BACTRIM DS,SEPTRA DS) 800-160 MG tablet Take 1 tablet by mouth 2 (two) times daily. 02/21/16   Clayburn Pert, MD  traMADol (ULTRAM) 50 MG tablet Take 1 tablet (50 mg total) by mouth every 6 (six) hours as needed for moderate pain. 02/14/16   Merlyn Lot, MD  warfarin (COUMADIN) 5 MG tablet Take  1 tablet (5 MG )Friday, Saturday and Sundays. Take 1 1/2 tablets (7.5 MG) the other days of the week 11/03/13   Historical Provider, MD  zolpidem (AMBIEN) 10 MG tablet Take 10 mg by mouth at bedtime.     Historical Provider, MD      VITAL SIGNS:  Blood pressure (!) 171/117, pulse 75, temperature 98.1 F (36.7 C), temperature source Oral, resp. rate 16, height 5\' 6"  (1.676 m), weight 54.3 kg (119 lb 9.6 oz), SpO2 100 %.  PHYSICAL EXAMINATION:   Physical Exam  Constitutional: She is oriented to person, place, and time. No distress.  Frail thin  HENT:  Head: Normocephalic.  Eyes: No scleral icterus.  Neck: Normal range of motion. Neck supple. No JVD present. No tracheal deviation present.  Cardiovascular: Normal rate, regular rhythm and normal heart sounds.  Exam reveals no gallop and no friction rub.   No murmur heard. Pulmonary/Chest: Effort normal and breath sounds normal. No respiratory distress. She has no wheezes. She has no rales. She exhibits no tenderness.  Abdominal: Soft.  Bowel sounds are normal. She exhibits no distension and no mass. There is no tenderness. There is no rebound and no guarding.  Musculoskeletal: Normal range of motion. She exhibits no edema.  Neurological: She is alert and oriented to person, place, and time.  Skin: Skin is warm. No rash noted. No erythema.  Psychiatric: Affect and judgment normal.      LABORATORY PANEL:   CBC  Recent Labs Lab 02/28/16 2016  WBC 4.2  HGB 8.6*  HCT 25.2*  PLT 134*   ------------------------------------------------------------------------------------------------------------------  Chemistries   Recent Labs Lab 02/28/16 2016  NA 132*  K 3.3*  CL 100*  CO2 22  GLUCOSE 99  BUN 14  CREATININE 3.49*  CALCIUM 9.7   ------------------------------------------------------------------------------------------------------------------  Cardiac Enzymes No results for input(s): TROPONINI in the last 168 hours. ------------------------------------------------------------------------------------------------------------------  RADIOLOGY:  No results found.  EKG:   Orders placed or performed during the hospital encounter of 02/28/16  . ED EKG  . ED EKG  . EKG 12-Lead  . EKG 12-Lead    IMPRESSION AND PLAN:   60 y/o female with ESRD on HD who was seen in surgery clinical and was supposed to come to ED from evaluation of dehydration, but instead was directly admited to the hospital. She was seen in ED yesterday for elvated BP. She is also having Suicidal thoughts.  1. Accelerated HTN: Restart home meds and add PRN Hydralazine.  2. ESRD: Nephro consult  3. SI: Psych consult Sitter  4, Hypokalemia: Recheck after repletion   5. Hx breast cancer s/p masectomy     Management plans discussed with the patient and she is in agreement  CODE STATUS: DNR  TOTAL TIME TAKING CARE OF THIS PATIENT: 45 minutes.    Jaylianna Tatlock M.D on 02/29/2016 at 5:04 PM  Between 7am to 6pm - Pager -  [phone]  After 6pm go to [email or web address] - password [email or web address] [facility]  Office  [phone]  CC: Primary care physician; Murlean Iba, MD

## 2016-02-29 NOTE — Care Management (Signed)
[facility] HD liaison notified of admission.  

## 2016-02-29 NOTE — Progress Notes (Signed)
Outpatient postop visit  02/29/2016  Teresa Cook is an 61 y.o. female.    Procedure: Bilateral Mastectomy  CC: Feels weak  HPI: Patient had her drain removed and then saw Dr. Adonis Huguenin last week where he diagnosed a small seroma. She also has skin slough from thermal injury. He is placed on Bactrim and asked to return today.  From her mastectomy standpoint she is doing well and having no problems.  In general she feels weak her blood pressure has been at first very low and then very high and today it is critically high she has abdominal pain she feels poorly she has not eaten in 4 days and is barely drinking fluids.  Medications reviewed.    Physical Exam:  Ht 5\' 6"  (1.676 m)   BMI 20.34 kg/m     PE: Hypertensive afebrile  In general she appears very weak and lethargic Bilateral mastectomy wounds show healing with eschar and secondary intention along the suture lines. No erythema minimal seroma on the right side large dogear on the left  Dialysis catheter in the left chest  Abdomen is soft minimally tender on the right side no guarding no rebound no percussion tenderness    Assessment/Plan:  Patient is experiencing considerable weakness and poor appetite. She is not eating and is dehydrated. She is hypertensive to a critical level area her wounds are healing well at this point but she requires evaluation in the emergency room and possible admission if fluid and rehydration does not help her overall condition at this point. We will arrange for transport to the emergency room.  As far as the mastectomy goes I would recommend that we see her back in 2 weeks.  [name], MD, FACS

## 2016-02-29 NOTE — Patient Instructions (Signed)
Please give us a call if you have any questions or concerns. 

## 2016-02-29 NOTE — Progress Notes (Addendum)
ANTICOAGULATION CONSULT NOTE - Initial Consult  Pharmacy Consult for warfarin dosing Indication: H/o DVT in left leg and right thigh  Allergies  Allergen Reactions  . Gabapentin Other (See Comments)    Seizure  . Adhesive [Tape] Itching    Silk tape is ok to use.    Patient Measurements: Height: 5\' 6"  (167.6 cm) Weight: 119 lb 9.6 oz (54.3 kg) IBW/kg (Calculated) : 59.3 Heparin Dosing Weight:   Vital Signs: Temp: 98.1 F (36.7 C) (02/22 1525) Temp Source: Oral (02/22 1525) BP: 171/117 (02/22 1525) Pulse Rate: 75 (02/22 1525)  Labs:  Recent Labs  02/28/16 2016 02/29/16 1806  HGB 8.6*  --   HCT 25.2*  --   PLT 134*  --   LABPROT  --  27.3*  INR  --  2.48  CREATININE 3.49*  --     Estimated Creatinine Clearance: 14.3 mL/min (by C-G formula based on SCr of 3.49 mg/dL (H)).   Medical History: Past Medical History:  Diagnosis Date  . Anemia   . Anxiety   . Breast cancer ([place]) 01/2016   bilateral  . CHF (congestive heart failure) ([place])   . Chronic kidney disease   . Depression   . Dialysis patient ([place])   . DVT (deep venous thrombosis) (HCC)    left leg  . DVT (deep venous thrombosis) ([place]) 1985   right thigh  . Dysrhythmia   . Gout   . Headache   . HTN (hypertension)   . Hypertension   . Parathyroid abnormality ([place])   . Parathyroid disease ([place])   . Pneumonia 12/2015  . Psoriasis   . Renal insufficiency   . Sickle cell trait (HCC)    traits    Medications:  Scheduled:  . feeding supplement (ENSURE ENLIVE)  237 mL Oral Daily  . heparin subcutaneous  5,000 Units Subcutaneous Q12H  . letrozole  2.5 mg Oral Daily  . levETIRAcetam  500 mg Oral Daily  . losartan  100 mg Oral Q T,Th,S,Su-1800  . metoprolol  100 mg Oral Q T,Th,S,Su  . pantoprazole  40 mg Oral Daily  . sertraline  100 mg Oral Daily  . sulfamethoxazole-trimethoprim  1 tablet Oral BID  . temazepam  15 mg Oral QHS    Assessment: Patient admitted for hypertension and suicidal  ideation w/ h/o DVTs and ESRD on HD is anticoagulated with warfarin. Patient takes warfarin 5 mg on Friday, Saturday, Sunday Then warfarin 7.5 mg (1.5 tablet) on other days of week (MTWTh)  2/22 INR 2.48 warfarin 7.5 mg  Goal of Therapy:  INR 2-3 Monitor platelets by anticoagulation protocol: Yes   Plan:  INR is currently in goal range and patient already received dose before coming into the hospital, will schedule her 5 mg dose for 2/23 if INR trends appropriately. Will monitor daily INRs as patient takes Septra DS at home along with warfarin.  Thank you for this consult.  Tobie Lords, PharmD, BCPS Clinical Pharmacist 02/29/2016

## 2016-03-01 DIAGNOSIS — F331 Major depressive disorder, recurrent, moderate: Secondary | ICD-10-CM | POA: Diagnosis not present

## 2016-03-01 LAB — BASIC METABOLIC PANEL
Anion gap: 10 (ref 5–15)
BUN: 32 mg/dL — ABNORMAL HIGH (ref 6–20)
CALCIUM: 8.8 mg/dL — AB (ref 8.9–10.3)
CO2: 21 mmol/L — ABNORMAL LOW (ref 22–32)
CREATININE: 6.18 mg/dL — AB (ref 0.44–1.00)
Chloride: 103 mmol/L (ref 101–111)
GFR calc Af Amer: 8 mL/min — ABNORMAL LOW (ref >60)
GFR, EST NON AFRICAN AMERICAN: 7 mL/min — AB (ref >60)
Glucose, Bld: 80 mg/dL (ref 65–99)
Potassium: 3.6 mmol/L (ref 3.5–5.1)
SODIUM: 134 mmol/L — AB (ref 135–145)

## 2016-03-01 LAB — CBC
HCT: 24.3 % — ABNORMAL LOW (ref 35.0–47.0)
Hemoglobin: 8.5 g/dL — ABNORMAL LOW (ref 12.0–16.0)
MCH: 29.2 pg (ref 26.0–34.0)
MCHC: 35 g/dL (ref 32.0–36.0)
MCV: 83.5 fL (ref 80.0–100.0)
PLATELETS: 126 10*3/uL — AB (ref 150–440)
RBC: 2.91 MIL/uL — AB (ref 3.80–5.20)
RDW: 15.5 % — ABNORMAL HIGH (ref 11.5–14.5)
WBC: 3.4 10*3/uL — ABNORMAL LOW (ref 3.6–11.0)

## 2016-03-01 LAB — PHOSPHORUS: Phosphorus: 5 mg/dL — ABNORMAL HIGH (ref 2.5–4.6)

## 2016-03-01 LAB — PROTIME-INR
INR: 2.47
PROTHROMBIN TIME: 27.2 s — AB (ref 11.4–15.2)

## 2016-03-01 LAB — HIV ANTIBODY (ROUTINE TESTING W REFLEX): HIV SCREEN 4TH GENERATION: NONREACTIVE

## 2016-03-01 MED ORDER — PANTOPRAZOLE SODIUM 40 MG PO TBEC: 40.0000 mg | DELAYED_RELEASE_TABLET | Freq: Every day | ORAL | 0 refills | Status: DC

## 2016-03-01 MED ORDER — SUCRALFATE 1 G PO TABS
1.0000 g | ORAL_TABLET | Freq: Three times a day (TID) | ORAL | Status: DC
  Administered 2016-03-01: 1 g via ORAL
  Filled 2016-03-01 (×2): qty 1

## 2016-03-01 MED ORDER — TEMAZEPAM 15 MG PO CAPS: 15.0000 mg | ORAL_CAPSULE | Freq: Every day | ORAL | 0 refills | Status: DC

## 2016-03-01 MED ORDER — SERTRALINE HCL 50 MG PO TABS: 150.0000 mg | ORAL_TABLET | Freq: Every day | ORAL | 0 refills | Status: DC

## 2016-03-01 MED ORDER — LEVETIRACETAM 500 MG PO TABS: 500.0000 mg | ORAL_TABLET | Freq: Every day | ORAL | 0 refills | Status: DC

## 2016-03-01 MED ORDER — AMLODIPINE BESYLATE 10 MG PO TABS: 10.0000 mg | ORAL_TABLET | Freq: Every day | ORAL | 0 refills | Status: DC

## 2016-03-01 MED ORDER — WARFARIN - PHARMACIST DOSING INPATIENT: Freq: Every day | Status: DC

## 2016-03-01 MED ORDER — TEMAZEPAM 7.5 MG PO CAPS: 22.5000 mg | ORAL_CAPSULE | Freq: Every day | ORAL | Status: DC

## 2016-03-01 MED ORDER — ONDANSETRON HCL 4 MG PO TABS: 4.0000 mg | ORAL_TABLET | Freq: Four times a day (QID) | ORAL | 0 refills | Status: DC | PRN

## 2016-03-01 MED ORDER — SUCRALFATE 1 G PO TABS: 1.0000 g | ORAL_TABLET | Freq: Three times a day (TID) | ORAL | 0 refills | Status: DC

## 2016-03-01 MED ORDER — NITROGLYCERIN 0.4 MG SL SUBL
0.4000 mg | SUBLINGUAL_TABLET | SUBLINGUAL | Status: DC | PRN
  Filled 2016-03-01: qty 25

## 2016-03-01 MED ORDER — ZOLPIDEM TARTRATE 5 MG PO TABS: 5.0000 mg | ORAL_TABLET | Freq: Every day | ORAL | 0 refills | Status: DC

## 2016-03-01 MED ORDER — AMLODIPINE BESYLATE 10 MG PO TABS
10.0000 mg | ORAL_TABLET | Freq: Every day | ORAL | Status: DC
  Administered 2016-03-01: 10 mg via ORAL
  Filled 2016-03-01 (×4): qty 1

## 2016-03-01 NOTE — Discharge Summary (Signed)
[facility] NAME: Teresa Cook    MR#:  [id number]  DATE OF BIRTH:  09/17/1955  DATE OF ADMISSION:  02/29/2016 ADMITTING PHYSICIAN: Bettey Costa, MD  DATE OF DISCHARGE: 03/01/2016   PRIMARY CARE PHYSICIAN: Murlean Iba, MD    ADMISSION DIAGNOSIS:  abd pain hypertension  DISCHARGE DIAGNOSIS:  Principal Problem:   Moderate recurrent major depression (HCC) Active Problems:   HTN (hypertension)   Panic attacks   SECONDARY DIAGNOSIS:   Past Medical History:  Diagnosis Date  . Anemia   . Anxiety   . Breast cancer ([facility]) 01/2016   bilateral  . CHF (congestive heart failure) ([place])   . Chronic kidney disease   . Depression   . Dialysis patient ([place])   . DVT (deep venous thrombosis) (H[facility])    left leg  . DVT (deep venous thrombosis) ([place]) 1985   right thigh  . Dysrhythmia   . Gout   . Headache   . HTN (hypertension)   . Hypertension   . Parathyroid abnormality ([place])   . Parathyroid disease ([place])   . Pneumonia 12/2015  . Psoriasis   . Renal insufficiency   . Sickle cell trait ([place])    traits    HOSPITAL COURSE:   61 y/o female with ESRD on HD who was seen in surgery clinical and was supposed to come to ED from evaluation of dehydration, but instead was directly admited to the hospital. She was seen in ED yesterday for elvated BP. She is also having Suicidal thoughts.  1. Accelerated HTN: Restart home meds and add PRN Hydralazine.   Started on amlodipine every evening, and advised to take on HD days evening, if BP is high.  2. ESRD: Nephro consult, HD done.  3. SI: Psych consult   Appreciated help, I also spoke to Dr. Weber Cooks on phone on 03/01/16, as per him- she is safe to d/c with his changed dose of meds. Follow with psych as out pt.  4, Hypokalemia: Recheck after repletion   5. Hx breast cancer s/p masectomy   6. GERD - give protonix and sucralfate at home on d/c  DISCHARGE CONDITIONS:    Stable.  CONSULTS OBTAINED:  Treatment Team:  Anthonette Legato, MD Gonzella Lex, MD  DRUG ALLERGIES:   Allergies  Allergen Reactions  . Gabapentin Other (See Comments)    Seizure  . Adhesive [Tape] Itching    Silk tape is ok to use.    DISCHARGE MEDICATIONS:   Current Discharge Medication List    START taking these medications   Details  amLODipine (NORVASC) 10 MG tablet Take 1 tablet (10 mg total) by mouth daily before supper. Take after checking blood pressure on Dialysis days, avoid taking it- if systolic BP < 778. Qty: 30 tablet, Refills: 0    ondansetron (ZOFRAN) 4 MG tablet Take 1 tablet (4 mg total) by mouth every 6 (six) hours as needed for nausea. Qty: 30 tablet, Refills: 0    sucralfate (CARAFATE) 1 g tablet Take 1 tablet (1 g total) by mouth 4 (four) times daily -  with meals and at bedtime. Qty: 100 tablet, Refills: 0    temazepam (RESTORIL) 15 MG capsule Take 1 capsule (15 mg total) by mouth at bedtime. Qty: 30 capsule, Refills: 0      CONTINUE these medications which have CHANGED   Details  levETIRAcetam (KEPPRA) 500 MG tablet Take 1 tablet (500 mg total) by mouth daily. Qty: 30  tablet, Refills: 0    pantoprazole (PROTONIX) 40 MG tablet Take 1 tablet (40 mg total) by mouth daily. Qty: 30 tablet, Refills: 0    sertraline (ZOLOFT) 50 MG tablet Take 3 tablets (150 mg total) by mouth daily. Qty: 90 tablet, Refills: 0    zolpidem (AMBIEN) 5 MG tablet Take 1 tablet (5 mg total) by mouth at bedtime. Qty: 30 tablet, Refills: 0      CONTINUE these medications which have NOT CHANGED   Details  acetaminophen (TYLENOL) 325 MG tablet Take 2 tablets (650 mg total) by mouth every 6 (six) hours as needed for mild pain (or Fever >/= 101).    ALPRAZolam (XANAX) 0.5 MG tablet Take 1 tablet (0.5 mg total) by mouth 2 (two) times daily as needed for anxiety. Qty: 20 tablet, Refills: 0    feeding supplement, ENSURE ENLIVE, (ENSURE ENLIVE) LIQD Take 237 mLs by  mouth 3 (three) times daily between meals. Qty: 237 mL, Refills: 12    letrozole (FEMARA) 2.5 MG tablet Take 1 tablet (2.5 mg total) by mouth daily. Qty: 30 tablet, Refills: 3   Associated Diagnoses: Malignant neoplasm of upper-inner quadrant of left breast in female, estrogen receptor positive ([place]); Postmenopausal    losartan (COZAAR) 50 MG tablet Take 100 mg by mouth See admin instructions. Takes 100 MG DAILY on non-dialysis days=Tuesday, Thursday, Saturday and Sunday.    metoprolol (LOPRESSOR) 100 MG tablet Take 0.5 tablets (50 mg total) by mouth 2 (two) times daily. Takes on non-dialysis days=Tuesday, Thursday, Saturday and Sunday. Qty: 60 tablet, Refills: 0    Scar Treatment Products (MEDERMA) GEL Apply 1 application topically 2 (two) times daily. Qty: 50 g, Refills: 2    traMADol (ULTRAM) 50 MG tablet Take 1 tablet (50 mg total) by mouth every 6 (six) hours as needed for moderate pain. Qty: 10 tablet, Refills: 0    warfarin (COUMADIN) 5 MG tablet Take 1 tablet (5 MG )Friday, Saturday and Sundays. Take 1 1/2 tablets (7.5 MG) the other days of the week      STOP taking these medications     sulfamethoxazole-trimethoprim (BACTRIM DS,SEPTRA DS) 800-160 MG tablet          DISCHARGE INSTRUCTIONS:    Follow with PMD in 1-2 weeks.  If you experience worsening of your admission symptoms, develop shortness of breath, life threatening emergency, suicidal or homicidal thoughts you must seek medical attention immediately by calling 911 or calling your MD immediately  if symptoms less severe.  You Must read complete instructions/literature along with all the possible adverse reactions/side effects for all the Medicines you take and that have been prescribed to you. Take any new Medicines after you have completely understood and accept all the possible adverse reactions/side effects.   Please note  You were cared for by a hospitalist during your hospital stay. If you have any  questions about your discharge medications or the care you received while you were in the hospital after you are discharged, you can call the unit and asked to speak with the hospitalist on call if the hospitalist that took care of you is not available. Once you are discharged, your primary care physician will handle any further medical issues. Please note that NO REFILLS for any discharge medications will be authorized once you are discharged, as it is imperative that you return to your primary care physician (or establish a relationship with a primary care physician if you do not have one) for your aftercare needs so  that they can reassess your need for medications and monitor your lab values.    Today   CHIEF COMPLAINT:  No chief complaint on file.   HISTORY OF PRESENT ILLNESS:  Teresa Cook  is a 61 y.o. female with a known history of ESRD on HD who was seen in surgery clinical and was supposed to come to ED from evaluation of dehydration, but instead was directly admited to the hospital. She was seen in ED yesterday for elvated BP. She is also having Suicidal thoughts.   VITAL SIGNS:  Blood pressure (!) 175/93, pulse 70, temperature 99 F (37.2 C), temperature source Oral, resp. rate 18, height 5\' 6"  (1.676 m), weight 54.3 kg (119 lb 11.4 oz), SpO2 100 %.  I/O:   Intake/Output Summary (Last 24 hours) at 03/01/16 1316 Last data filed at 03/01/16 0805  Gross per 24 hour  Intake           580.67 ml  Output              500 ml  Net            80.67 ml    PHYSICAL EXAMINATION:   Constitutional: She is oriented to person, place, and time. No distress.  Frail thin  HENT:  Head: Normocephalic.  Eyes: No scleral icterus.  Neck: Normal range of motion. Neck supple. No JVD present. No tracheal deviation present.  Cardiovascular: Normal rate, regular rhythm and normal heart sounds.  Exam reveals no gallop and no friction rub.   No murmur heard. Pulmonary/Chest: Effort normal and  breath sounds normal. No respiratory distress. She has no wheezes. She has no rales. She exhibits no tenderness.  Abdominal: Soft. Bowel sounds are normal. She exhibits no distension and no mass. There is no tenderness. There is no rebound and no guarding.  Musculoskeletal: Normal range of motion. She exhibits no edema.  Neurological: She is alert and oriented to person, place, and time.  Skin: Skin is warm. No rash noted. No erythema.  Psychiatric: Affect and judgment normal.   DATA REVIEW:   CBC  Recent Labs Lab 03/01/16 0629  WBC 3.4*  HGB 8.5*  HCT 24.3*  PLT 126*    Chemistries   Recent Labs Lab 03/01/16 0629  NA 134*  K 3.6  CL 103  CO2 21*  GLUCOSE 80  BUN 32*  CREATININE 6.18*  CALCIUM 8.8*    Cardiac Enzymes No results for input(s): TROPONINI in the last 168 hours.  Microbiology Results  Results for orders placed or performed during the hospital encounter of 02/29/16  MRSA PCR Screening     Status: None   Collection Time: 02/29/16  6:12 PM  Result Value Ref Range Status   MRSA by PCR NEGATIVE NEGATIVE Final    Comment:        The GeneXpert MRSA Assay (FDA approved for NASAL specimens only), is one component of a comprehensive MRSA colonization surveillance program. It is not intended to diagnose MRSA infection nor to guide or monitor treatment for MRSA infections.     RADIOLOGY:  No results found.  EKG:   Orders placed or performed during the hospital encounter of 02/28/16  . ED EKG  . ED EKG  . EKG 12-Lead  . EKG 12-Lead      Management plans discussed with the patient, family and they are in agreement.  CODE STATUS:     Code Status Orders        Start  Ordered   02/29/16 1723  Do not attempt resuscitation (DNR)  Continuous    Question Answer Comment  In the event of cardiac or respiratory ARREST Do not call a "code blue"   In the event of cardiac or respiratory ARREST Do not perform Intubation, CPR, defibrillation or  ACLS   In the event of cardiac or respiratory ARREST Use medication by any route, position, wound care, and other measures to relive pain and suffering. May use oxygen, suction and manual treatment of airway obstruction as needed for comfort.      02/29/16 1722    Code Status History    Date Active Date Inactive Code Status Order ID Comments User Context   02/29/2016  5:04 PM 02/29/2016  5:22 PM Full Code [id number]  Bettey Costa, MD Inpatient   01/23/2016  6:33 PM 01/29/2016  9:45 PM Full Code [id number]  Hubbard Robinson, MD Inpatient   01/22/2016  3:43 PM 01/23/2016  6:33 PM Full Code [id number]  Dustin Flock, MD Inpatient   12/31/2015  9:39 PM 01/03/2016  9:31 PM Full Code [id number]  [name], MD Inpatient   12/15/2015 10:40 PM 12/28/2015  6:52 PM Full Code [id number]  Harvie Bridge, DO Inpatient   08/31/2015  4:26 AM 09/02/2015  2:35 PM Full Code [id number]  Harrie Foreman, MD Inpatient   08/27/2015  4:06 AM 08/29/2015  3:57 PM Full Code [id number]  Saundra Shelling, MD ED   07/09/2015  2:44 PM 07/12/2015  7:55 PM Full Code [id number]  Laverle Hobby, MD ED   09/19/2014 11:47 AM 09/20/2014  6:25 PM Full Code [id number]  Dustin Flock, MD ED   08/29/2014  2:28 PM 08/30/2014  5:43 PM Full Code [id number]  Fritzi Mandes, MD Inpatient    Advance Directive Documentation   Flowsheet Row Most Recent Value  Type of Advance Directive  Healthcare Power of Attorney, Living will  Pre-existing out of facility DNR order (yellow form or pink MOST form)  No data  "MOST" Form in Place?  No data      TOTAL TIME TAKING CARE OF THIS PATIENT: 35 minutes.    [name] M.D on 03/01/2016 at 1:16 PM  Between 7am to 6pm - Pager - [phone]  After 6pm go to [email or web address] - password [email or web address] [facility]  Office  [phone]  CC: Primary care physician; Murlean Iba, MD   Note: This dictation was prepared with Dragon dictation along with smaller phrase technology. Any  transcriptional errors that result from this process are unintentional.

## 2016-03-01 NOTE — Progress Notes (Signed)
Post hemodialysis: Completed only 3hours of treatment per patient request due to that's what time she runs in outpatient clinic.Patient with no complaints of chest pains at this time,heartrate in 90s,sinus rhythm with PVCs.Prn pain medication given at end of treatment for complaints of headache.CVC clamped/capped and lumens wrapped with gauze/taped.

## 2016-03-01 NOTE — Care Management Obs Status (Signed)
[facility] NOTIFICATION   Patient Details  Name: Teresa Cook MRN: [id number] Date of Birth: 20-Jun-1955   Medicare Observation Status Notification Given:  No (Admitted observation less than 24 hours)    Beverly Sessions, RN 03/01/2016, 1:45 PM

## 2016-03-01 NOTE — Progress Notes (Signed)
Pre-hd tx 

## 2016-03-01 NOTE — Progress Notes (Signed)
I was  notified of A-fib episode by dialysis nurse while patient was having dialysis. Dr. Anselm Jungling was notified by this RN. No new orders received. Patient was offered BP med but patient refused. Educated on the importance of BP med. She also refused some meds. See E-mar. Had some several concerns. Requested to see Surveyor, quantity. AD notified.

## 2016-03-01 NOTE — Progress Notes (Signed)
Post hd tx 

## 2016-03-01 NOTE — Progress Notes (Signed)
Surveyor, quantity at bedside to help with patient regarding readiness for discharge. Daughter at bedside. Reinforced discharge teaching and medications. Understanding verbalized. Patient requested medications she did not take earlier when offered by nurse. Medications given (See MAR).

## 2016-03-01 NOTE — Progress Notes (Signed)
Patient taken downstairs via wheelchair to discharge home via car with daughter.

## 2016-03-01 NOTE — Progress Notes (Signed)
PT Cancellation Note  Patient Details Name: Teresa Cook MRN: [id number] DOB: [birth year]   Cancelled Treatment:    Reason Eval/Treat Not Completed: Other (comment) (Consult received and chart reviewed.  Evaluation attempted.  Patient refusing participation despite encouragement due to headache (received meds approx one hour prior).  Will re-attempt at later time/date as medically appropriate.)   Jezreel Justiniano H. Owens Shark, PT, DPT, NCS 03/01/16, 2:38 PM [phone]

## 2016-03-01 NOTE — Progress Notes (Signed)
ANTICOAGULATION CONSULT NOTE - Initial Consult  Pharmacy Consult for warfarin dosing Indication: H/o DVT in left leg and right thigh  Allergies  Allergen Reactions  . Gabapentin Other (See Comments)    Seizure  . Adhesive [Tape] Itching    Silk tape is ok to use.    Patient Measurements: Height: 5\' 6"  (167.6 cm) Weight: 119 lb 9.6 oz (54.3 kg) IBW/kg (Calculated) : 59.3 Heparin Dosing Weight:   Vital Signs: Temp: 98.5 F (36.9 C) (02/23 0440) Temp Source: Oral (02/23 0440) BP: 177/106 (02/23 0441) Pulse Rate: 85 (02/23 0441)  Labs:  Recent Labs  02/28/16 2016 02/29/16 1806 03/01/16 0629  HGB 8.6*  --  8.5*  HCT 25.2*  --  24.3*  PLT 134*  --  126*  LABPROT  --  27.3* 27.2*  INR  --  2.48 2.47  CREATININE 3.49*  --  6.18*    Estimated Creatinine Clearance: 8.1 mL/min (by C-G formula based on SCr of 6.18 mg/dL (H)).   Medical History: Past Medical History:  Diagnosis Date  . Anemia   . Anxiety   . Breast cancer ([place]) 01/2016   bilateral  . CHF (congestive heart failure) ([place])   . Chronic kidney disease   . Depression   . Dialysis patient ([place])   . DVT (deep venous thrombosis) ([facility])    left leg  . DVT (deep venous thrombosis) ([place]) 1985   right thigh  . Dysrhythmia   . Gout   . Headache   . HTN (hypertension)   . Hypertension   . Parathyroid abnormality ([place])   . Parathyroid disease ([place])   . Pneumonia 12/2015  . Psoriasis   . Renal insufficiency   . Sickle cell trait ([facility])    traits    Medications:  Scheduled:  . amLODipine  10 mg Oral Daily  . feeding supplement (ENSURE ENLIVE)  237 mL Oral Daily  . letrozole  2.5 mg Oral Daily  . levETIRAcetam  500 mg Oral Daily  . losartan  100 mg Oral Q T,Th,S,Su-1800  . metoprolol  100 mg Oral Q T,Th,S,Su  . pantoprazole  40 mg Oral Daily  . sertraline  150 mg Oral Daily  . temazepam  15 mg Oral QHS  . warfarin  5 mg Oral ONCE-1800    Assessment: Patient admitted for hypertension and suicidal  ideation w/ h/o DVTs and ESRD on HD is anticoagulated with warfarin. Patient takes warfarin 5 mg on Friday, Saturday, Sunday Then warfarin 7.5 mg (1.5 tablet) on other days of week (MTWTh)  2/22 INR 2.48 warfarin 7.5 mg 2/23 INR 2.47   Goal of Therapy:  INR 2-3 Monitor platelets by anticoagulation protocol: Yes   Plan:  INR is therapeutic. Will continue pt home dose. Patient was on bactrim for the past week which can increase INR. Patient no longer on as she has finished her weeks course. Of note, her INR has been low the end of january, beginning of feb as pt had held her warfarin for several days due to procedure, resumed on 1/19. Was not therapeutic on time of discharge 1/22. Was slightly subtherapeutic at ER visit in 2/7 (1.92). Continue to monitor and adjust as needed. INR in the AM  Thank you for this consult.  Ramond Dial, Pharm.D, BCPS Clinical Pharmacist  03/01/2016

## 2016-03-01 NOTE — Progress Notes (Signed)
Hemodialysis treatment started without complications.

## 2016-03-01 NOTE — Discharge Instructions (Addendum)
On hemodialysis days- take amlodipine after checking BP in evening. and only take-if BP > 379 systolic.  Continue taking your home dose of warfarin- 5mg  on Friday, Sat, Sunday and 7.5mg  all other days. Have your INR checked within the next week.  Information on my medicine - Coumadin   (Warfarin)  This medication education was reviewed with me or my healthcare representative as part of my discharge preparation.  The pharmacist that spoke with me during my hospital stay was:  Ramond Dial, [facility]  Why was Coumadin prescribed for you? Coumadin was prescribed for you because you have a blood clot or a medical condition that can cause an increased risk of forming blood clots. Blood clots can cause serious health problems by blocking the flow of blood to the heart, lung, or brain. Coumadin can prevent harmful blood clots from forming. As a reminder your indication for Coumadin is:   Blood Clotting Disorder  What test will check on my response to Coumadin? While on Coumadin (warfarin) you will need to have an INR test regularly to ensure that your dose is keeping you in the desired range. The INR (international normalized ratio) number is calculated from the result of the laboratory test called prothrombin time (PT).  If an INR APPOINTMENT HAS NOT ALREADY BEEN MADE FOR YOU please schedule an appointment to have this lab work done by your health care provider within 7 days. Your INR goal is usually a number between:  2 to 3 or your provider may give you a more narrow range like 2-2.5.  Ask your health care provider during an office visit what your goal INR is.  What  do you need to  know  About  COUMADIN? Take Coumadin (warfarin) exactly as prescribed by your healthcare provider about the same time each day.  DO NOT stop taking without talking to the doctor who prescribed the medication.  Stopping without other blood clot prevention medication to take the place of Coumadin may increase your risk of  developing a new clot or stroke.  Get refills before you run out.  What do you do if you miss a dose? If you miss a dose, take it as soon as you remember on the same day then continue your regularly scheduled regimen the next day.  Do not take two doses of Coumadin at the same time.  Important Safety Information A possible side effect of Coumadin (Warfarin) is an increased risk of bleeding. You should call your healthcare provider right away if you experience any of the following: ? Bleeding from an injury or your nose that does not stop. ? Unusual colored urine (red or dark brown) or unusual colored stools (red or black). ? Unusual bruising for unknown reasons. ? A serious fall or if you hit your head (even if there is no bleeding).  Some foods or medicines interact with Coumadin (warfarin) and might alter your response to warfarin. To help avoid this: ? Eat a balanced diet, maintaining a consistent amount of Vitamin K. ? Notify your provider about major diet changes you plan to make. ? Avoid alcohol or limit your intake to 1 drink for women and 2 drinks for men per day. (1 drink is 5 oz. wine, 12 oz. beer, or 1.5 oz. liquor.)  Make sure that ANY health care provider who prescribes medication for you knows that you are taking Coumadin (warfarin).  Also make sure the healthcare provider who is monitoring your Coumadin knows when you have started  a new medication including herbals and non-prescription products.  Coumadin (Warfarin)  Major Drug Interactions  Increased Warfarin Effect Decreased Warfarin Effect  Alcohol (large quantities) Antibiotics (esp. Septra/Bactrim, Flagyl, Cipro) Amiodarone (Cordarone) Aspirin (ASA) Cimetidine (Tagamet) Megestrol (Megace) NSAIDs (ibuprofen, naproxen, etc.) Piroxicam (Feldene) Propafenone (Rythmol SR) Propranolol (Inderal) Isoniazid (INH) Posaconazole (Noxafil) Barbiturates (Phenobarbital) Carbamazepine (Tegretol) Chlordiazepoxide  (Librium) Cholestyramine (Questran) Griseofulvin Oral Contraceptives Rifampin Sucralfate (Carafate) Vitamin K   Coumadin (Warfarin) Major Herbal Interactions  Increased Warfarin Effect Decreased Warfarin Effect  Garlic Ginseng Ginkgo biloba Coenzyme Q10 Green tea St. Johns wort    Coumadin (Warfarin) FOOD Interactions  Eat a consistent number of servings per week of foods HIGH in Vitamin K (1 serving =  cup)  Collards (cooked, or boiled & drained) Kale (cooked, or boiled & drained) Mustard greens (cooked, or boiled & drained) Parsley *serving size only =  cup Spinach (cooked, or boiled & drained) Swiss chard (cooked, or boiled & drained) Turnip greens (cooked, or boiled & drained)  Eat a consistent number of servings per week of foods MEDIUM-HIGH in Vitamin K (1 serving = 1 cup)  Asparagus (cooked, or boiled & drained) Broccoli (cooked, boiled & drained, or raw & chopped) Brussel sprouts (cooked, or boiled & drained) *serving size only =  cup Lettuce, raw (green leaf, endive, romaine) Spinach, raw Turnip greens, raw & chopped   These websites have more information on Coumadin (warfarin):  FailFactory.se; VeganReport.com.au;

## 2016-03-01 NOTE — Care Management (Signed)
Notified by [facility] liaison that Patient is HD patient [facility] MWF 2nd shift. Patient previously open with [facility], closed 02/22/16.  PT consult pending.

## 2016-03-01 NOTE — Progress Notes (Signed)
Hemodialysis:  Heartrate back in 80s as pre-tx,ultrafiltration goal remains off.Patient with no complaints at this time.

## 2016-03-01 NOTE — Progress Notes (Signed)
PT Cancellation Note  Patient Details Name: Teresa Cook MRN: [id number] DOB: [birth year]   Cancelled Treatment:    Reason Eval/Treat Not Completed: PT screened, no needs identified, will sign off.  Pt able to perform bed mobility, transfers, and ambulation in room without difficulty (no assistive device required); no loss of balance noted with functional mobility.  Pt left sitting on edge of bed with nursing present (nursing notified bed alarm off).  Please re-consult PT if any acute PT needs are identified.  Leitha Bleak, PT 03/01/16, 4:28 PM [phone]

## 2016-03-01 NOTE — Care Management (Signed)
Patient declining home health PT. [facility] liaison notified of discharge.

## 2016-03-01 NOTE — Progress Notes (Signed)
Initial Nutrition Assessment  DOCUMENTATION CODES:   Severe malnutrition in context of chronic illness  INTERVENTION:  Ensure Enlive po BID, each supplement provides 350 kcal and 20 grams of protein  NUTRITION DIAGNOSIS:   Malnutrition related to chronic illness as evidenced by 11 percent weight loss in 1 month, moderate depletion of body fat, moderate depletions of muscle mass.  GOAL:   Patient will meet greater than or equal to 90% of their needs  MONITOR:   PO intake, Supplement acceptance, I & O's, Labs  REASON FOR ASSESSMENT:   Malnutrition Screening Tool    ASSESSMENT:   61 y/o female with ESRD on HD who was seen in surgery clinical and was supposed to come to ED from evaluation of dehydration, but instead was directly admited to the hospital. She was seen in ED yesterday for elvated BP. She is also having Suicidal thoughts.   Met with pt in room today. Pt reports poor appetite currently and for 1 month pta. Pt is currently eating <25% meals. Per chart, pt has lost 15lbs(11%) in 1 month. This is severe. Some wt could be attributed to fluid changes as pt on HD. RD discussed with pt the importance of adequate protein intake. Pt scheduled to discharge today. Pt with elevated Phosphorus; can order Nepro if Phosphorus continues to be high.   Medications reviewed and include: protonix, carafate, warfarin, tramadol  Labs reviewed: Na 134(L), C02 21(L), BUN 32(H), creat 6.18(H), Ca 8.8(L), P 5.0(H) Wbc- 3.4(L), Hgb 8.5(L), Hct 24.3(L)  Nutrition-Focused physical exam completed. Findings are moderate to severe fat depletion, moderate to severe muscle depletion, and no edema.   Diet Order:  Diet renal with fluid restriction Fluid restriction: 1200 mL Fluid; Room service appropriate? Yes; Fluid consistency: Thin Diet - low sodium heart healthy  Skin:  Reviewed, no issues  Last BM:  2/22  Height:   Ht Readings from Last 1 Encounters:  02/29/16 '5\' 6"'  (1.676 m)    Weight:    Wt Readings from Last 1 Encounters:  03/01/16 119 lb 11.4 oz (54.3 kg)    Ideal Body Weight:  59 kg  BMI:  Body mass index is 19.32 kg/m.  Estimated Nutritional Needs:   Kcal:  1600-1900kcal/day   Protein:  81-92g/day   Fluid:  >1.6L/day   EDUCATION NEEDS:   No education needs identified at this time  Koleen Distance, RD, LDN Pager #[phone]

## 2016-03-01 NOTE — Progress Notes (Signed)
Chaplain received an order to visit with pt in room 229. Pt lost a loved one last year and was having a hard time coping. Pt told me she had a high fever and high white blood count cells. Pt seemed emotionally stable after our visit. This was a follow up visit. I saw this pt a month or so ago. Pt was going to dialysis at the end of our conversation. Provided the ministry of prayer and a pastoral presence.    03/01/16 3570  Clinical Encounter Type  Visited With Patient  Visit Type Follow-up;Spiritual support  Referral From Nurse  Consult/Referral To Chaplain  Spiritual Encounters  Spiritual Needs Prayer;Emotional

## 2016-03-01 NOTE — Progress Notes (Addendum)
Hemodialysis: Dr.Lateef bedside rounding,patient complaint of chest pain,order given to turn ultrafiltration off.Made aware of heartrate originally in 80s now tachycardia at 112.Order given to give patient Nitro if complaint of chest pains again and continue to leave ultrafiltration off. .After 59minutes heartrate starting coming back down to 90s.Will continue to monitor.

## 2016-03-01 NOTE — Consult Note (Signed)
[place] Psychiatry Consult   Reason for Consult:  Consult for 61 year old woman with multiple medical problems including history of breast cancer and renal failure on dialysis. Consult because of expressed suicidal ideation Referring Physician:  Mody Patient Identification: Teresa Cook MRN:  [id number] Principal Diagnosis: Moderate recurrent major depression ([place]) Diagnosis:   Patient Active Problem List   Diagnosis Date Noted  . HTN (hypertension) [I10] 02/29/2016  . Moderate recurrent major depression ([place]) [F33.1] 02/29/2016  . Panic attacks [F41.0] 02/29/2016  . Scalp lesion [L98.9]   . Breast cancer ([place]) [C50.919] 01/23/2016  . ESRD (end stage renal disease) ([place]) [N18.6] 01/22/2016  . Severe anemia [D64.9] 01/01/2016  . Seizure ([place]) [R56.9] 12/31/2015  . Healthcare-associated pneumonia [J18.9]   . Bloodstream infection due to port-a-cath, initial encounter [T80.211A]   . Sepsis ([place]) [A41.9] 12/15/2015  . Chest pain [R07.9] 08/31/2015  . SIRS (systemic inflammatory response syndrome) (HCC) [R65.10] 08/27/2015  . Malignant neoplasm of upper-inner quadrant of left breast in female, estrogen receptor positive ([place]) [Z76.734, Z17.0] 08/05/2015  . Pulmonary edema [J81.1] 07/09/2015  . Dizziness [R42] 03/03/2015  . Seizures ([place]) [R56.9] 03/03/2015  . Spells [IMO0001] 11/11/2014  . Acute respiratory failure with hypoxia ([place]) [J96.01] 09/19/2014  . Left ventricular dysfunction [I51.9] 09/06/2014  . Acute pulmonary edema ([place]) [J81.0] 08/30/2014  . Respiratory failure ([place]) [J96.90] 08/29/2014  . Respiratory difficulty [R06.00] 08/29/2014  . Chronic systolic heart failure ([place]) [I50.22] 05/16/2014  . Hypertension [I10] 05/16/2014  . Renal failure [N19] 05/16/2014  . Anxiety [F41.9] 05/16/2014  . Sickle cell trait ([place]) [D57.3] 03/03/2014  . ESRD on dialysis ([place]) [N18.6, Z99.2] 11/02/2013  . Dysgeusia [R43.2] 01/27/2013  . Weight loss [R63.4] 01/27/2013  .  Resting tremor [R25.9] 08/13/2012  . Depression [F32.9] 04/30/2012  . 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 Right leg DVT ([place]) [I82.401] 04/30/2012    Total Time spent with patient: 20 minutes  Subjective:   Teresa Cook is a 61 y.o. female patient admitted with "I am scared of being sick".  Follow-up for this 61 year old woman with depression symptoms. Patient seen. Spoke with hospitalist. Patient tells me she is tired this afternoon after dialysis but that her mood is a little better. She slept mask better last night although she thinks that probably the dose of the sleeping medicine could stand to be increased slightly. Patient was awake alert and oriented and appropriate. Denied any suicidal thoughts today. Shows some optimism for the future.  HPI:  Patient interviewed. Some information from her adult daughter in the room as well. Chart reviewed. Spoke with nursing. 61 year old woman was directly admitted to the hospital today with extreme hypertension. Patient also made suicidal statements reportedly at her doctor's office today. On interview today the patient readily admits that she is feeling bad both physically and emotionally. She is under a very large amount of stress. Not only is she on dialysis regularly and a cancer survivor and have multiple medical problems but her husband of 25 years just died last week leaving her alone at home. She has been suffering with panic attacks intermittently for years but in the last couple weeks they have become an almost daily event. She is able to gradually calm herself down but they are very disruptive to her when they do happen. Her sleep is been extremely poor probably for months. Appetite is also poor and has been bad ever since she had treatment for her cancer. She admits that she has sad thoughts much of the  time and that she has had thoughts of sometimes wishing that she would die but states that she has no intention or  plan whatsoever to kill her self. Patient is able to cite positive things in her life including her grandchildren and her religious faith as reasons not to kill her self. Furthermore she has motivation to continue medical care. She is on Zoloft 100 mg a day and has been for years. She also takes alprazolam when necessary with minimal benefit.  Social history: Patient's husband died just a week ago and the funeral was this past weekend. They had been together about 25 years. Multiple children and multiple grandchildren. Patient is still driving herself to dialysis. Seems to have a very good relationship with at least some of her family.  Medical history: Multiple chronic and serious medical problems. History of breast cancer treated with radiation and surgery. End-stage renal failure on dialysis extreme hypertension.  Substance abuse history: Denies any alcohol or drug abuse currently or past. No history of substance abuse.  Past Psychiatric History: Patient has been treated for anxiety and depression for years. She says that she did see a psychiatrist about 15 years ago for anxiety but has not been seeing a psychiatrist or therapist any time recently. No history of psychiatric hospitalization. No history of suicide attempts or violence. No psychosis.  Risk to Self: Is patient at risk for suicide?: Yes Risk to Others:   Prior Inpatient Therapy:   Prior Outpatient Therapy:    Past Medical History:  Past Medical History:  Diagnosis Date  . Anemia   . Anxiety   . Breast cancer ([place]) 01/2016   bilateral  . CHF (congestive heart failure) ([place])   . Chronic kidney disease   . Depression   . Dialysis patient ([place])   . DVT (deep venous thrombosis) (HCC)    left leg  . DVT (deep venous thrombosis) ([place]) 1985   right thigh  . Dysrhythmia   . Gout   . Headache   . HTN (hypertension)   . Hypertension   . Parathyroid abnormality ([place])   . Parathyroid disease ([place])   . Pneumonia 12/2015  .  Psoriasis   . Renal insufficiency   . Sickle cell trait ([place])    traits    Past Surgical History:  Procedure Laterality Date  . ABDOMINAL HYSTERECTOMY  1980  . APPENDECTOMY    . BREAST BIOPSY Left 10/28/2013   benign  . BREAST EXCISIONAL BIOPSY Left 2002   benign  . INSERTION OF DIALYSIS CATHETER  2014  . LIPOMA EXCISION N/A 01/23/2016   Procedure: EXCISION LIPOMA;  Surgeon: Hubbard Robinson, MD;  Location: [facility] ORS;  Service: General;  Laterality: N/A;  . MASTECTOMY W/ SENTINEL NODE BIOPSY Bilateral 01/23/2016   Procedure: bilateral MASTECTOMY WITH  bilateral SENTINEL LYMPH NODE BIOPSY possible left axillary node dissection forehead lipoma removal;  Surgeon: Hubbard Robinson, MD;  Location: [facility] ORS;  Service: General;  Laterality: Bilateral;  . PARTIAL HYSTERECTOMY    . PERIPHERAL VASCULAR CATHETERIZATION N/A 12/25/2015   Procedure: Dialysis/Perma Catheter Insertion;  Surgeon: Algernon Huxley, MD;  Location: [place] [facility];  Service: Cardiovascular;  Laterality: N/A;  . PERIPHERAL VASCULAR CATHETERIZATION Left 01/22/2016   Procedure: Dialysis/Perma Catheter Insertion;  Surgeon: Algernon Huxley, MD;  Location: [place] CV LAB;  Service: Cardiovascular;  Laterality: Left;  . PERIPHERAL VASCULAR CATHETERIZATION N/A 01/26/2016   Procedure: Dialysis/Perma Catheter Insertion;  Surgeon: Katha Cabal, MD;  Location: [place]  CV LAB;  Service: Cardiovascular;  Laterality: N/A;  . PORT-A-CATH REMOVAL N/A 12/20/2015   Procedure: REMOVAL PORT-A-CATH;  Surgeon: Hubbard Robinson, MD;  Location: [facility] ORS;  Service: General;  Laterality: N/A;  left    . PORTACATH PLACEMENT Left 08/21/2015   Procedure: INSERTION PORT-A-CATH;  Surgeon: Hubbard Robinson, MD;  Location: [facility] OR[facility];  Service: General;  Laterality: Left;  . REMOVAL OF A DIALYSIS CATHETER  2017   Family History:  Family History  Problem Relation Age of Onset  . Stroke Mother   . CVA Mother   . Hypertension Mother   .  Hypertension Father   . Hypertension Sister   . Diabetes Sister   . Cancer Sister 52    Breast  . Stroke Brother   . Hypertension Brother   . Breast cancer Maternal Aunt 70   Family Psychiatric  History: She has a nephew with schizophrenia and one son who is autistic no family history of suicide Social History:  History  Alcohol Use No     History  Drug Use No    Social History   Social History  . Marital status: Widowed    Spouse name: N/A  . Number of children: N/A  . Years of education: N/A   Occupational History  . disabled    Social History Main Topics  . Smoking status: Never Smoker  . Smokeless tobacco: Never Used  . Alcohol use No  . Drug use: No  . Sexual activity: No   Other Topics Concern  . None   Social History Narrative   ** Merged History Encounter **       Additional Social History:    Allergies:   Allergies  Allergen Reactions  . Gabapentin Other (See Comments)    Seizure  . Adhesive [Tape] Itching    Silk tape is ok to use.    Labs:  Results for orders placed or performed during the hospital encounter of 02/29/16 (from the past 48 hour(s))  HIV antibody (Routine Testing)     Status: None   Collection Time: 02/29/16  6:06 PM  Result Value Ref Range   HIV Screen 4th Generation wRfx Non Reactive Non Reactive    Comment: (NOTE) Performed At: [facility] [address] [place], [place] [id number] Lindon Romp MD WU:[id number]   Protime-INR     Status: Abnormal   Collection Time: 02/29/16  6:06 PM  Result Value Ref Range   Prothrombin Time 27.3 (H) 11.4 - 15.2 seconds   INR 2.48   MRSA PCR Screening     Status: None   Collection Time: 02/29/16  6:12 PM  Result Value Ref Range   MRSA by PCR NEGATIVE NEGATIVE    Comment:        The GeneXpert MRSA Assay (FDA approved for NASAL specimens only), is one component of a comprehensive MRSA colonization surveillance program. It is not intended to diagnose MRSA infection  nor to guide or monitor treatment for MRSA infections.   Basic metabolic panel     Status: Abnormal   Collection Time: 03/01/16  6:29 AM  Result Value Ref Range   Sodium 134 (L) 135 - 145 mmol/L   Potassium 3.6 3.5 - 5.1 mmol/L   Chloride 103 101 - 111 mmol/L   CO2 21 (L) 22 - 32 mmol/L   Glucose, Bld 80 65 - 99 mg/dL   BUN 32 (H) 6 - 20 mg/dL   Creatinine, Ser 6.18 (H) 0.44 - 1.00  mg/dL   Calcium 8.8 (L) 8.9 - 10.3 mg/dL   GFR calc non Af Amer 7 (L) >60 mL/min   GFR calc Af Amer 8 (L) >60 mL/min    Comment: (NOTE) The eGFR has been calculated using the CKD EPI equation. This calculation has not been validated in all clinical situations. eGFR's persistently <60 mL/min signify possible Chronic Kidney Disease.    Anion gap 10 5 - 15  CBC     Status: Abnormal   Collection Time: 03/01/16  6:29 AM  Result Value Ref Range   WBC 3.4 (L) 3.6 - 11.0 K/uL   RBC 2.91 (L) 3.80 - 5.20 MIL/uL   Hemoglobin 8.5 (L) 12.0 - 16.0 g/dL   HCT 24.3 (L) 35.0 - 47.0 %   MCV 83.5 80.0 - 100.0 fL   MCH 29.2 26.0 - 34.0 pg   MCHC 35.0 32.0 - 36.0 g/dL   RDW 15.5 (H) 11.5 - 14.5 %   Platelets 126 (L) 150 - 440 K/uL  Protime-INR     Status: Abnormal   Collection Time: 03/01/16  6:29 AM  Result Value Ref Range   Prothrombin Time 27.2 (H) 11.4 - 15.2 seconds   INR 2.47   Phosphorus     Status: Abnormal   Collection Time: 03/01/16  6:29 AM  Result Value Ref Range   Phosphorus 5.0 (H) 2.5 - 4.6 mg/dL    Current Facility-Administered Medications  Medication Dose Route Frequency Provider Last Rate Last Dose  . acetaminophen (TYLENOL) tablet 650 mg  650 mg Oral Q6H PRN Bettey Costa, MD       Or  . acetaminophen (TYLENOL) suppository 650 mg  650 mg Rectal Q6H PRN Bettey Costa, MD      . ALPRAZolam Duanne Moron) tablet 0.5 mg  0.5 mg Oral BID PRN Bettey Costa, MD      . amLODipine (NORVASC) tablet 10 mg  10 mg Oral Daily [name], MD      . feeding supplement (ENSURE ENLIVE) (ENSURE ENLIVE) liquid  237 mL  237 mL Oral Daily Bettey Costa, MD   237 mL at 02/29/16 1804  . hydrALAZINE (APRESOLINE) injection 10 mg  10 mg Intravenous Q6H PRN Bettey Costa, MD   10 mg at 02/29/16 1804  . letrozole [facility]) tablet 2.5 mg  2.5 mg Oral Daily Bettey Costa, MD   Stopped at 02/29/16 1747  . levETIRAcetam (KEPPRA) tablet 500 mg  500 mg Oral Daily Bettey Costa, MD   500 mg at 02/29/16 1804  . losartan (COZAAR) tablet 100 mg  100 mg Oral Q T,Th,S,Su-1800 Sital Mody, MD      . metoprolol (LOPRESSOR) tablet 100 mg  100 mg Oral Q T,Th,S,Su Sital Mody, MD      . nitroGLYCERIN (NITROSTAT) SL tablet 0.4 mg  0.4 mg Sublingual Q5 min PRN Munsoor Lateef, MD      . ondansetron (ZOFRAN) tablet 4 mg  4 mg Oral Q6H PRN Bettey Costa, MD       Or  . ondansetron (ZOFRAN) injection 4 mg  4 mg Intravenous Q6H PRN Sital Mody, MD      . pantoprazole (PROTONIX) EC tablet 40 mg  40 mg Oral Daily Sital Mody, MD   40 mg at 03/01/16 1512  . senna-docusate (Senokot-S) tablet 1 tablet  1 tablet Oral QHS PRN Bettey Costa, MD      . sertraline (ZOLOFT) tablet 150 mg  150 mg Oral Daily Gonzella Lex, MD   150 mg at 03/01/16  1511  . sucralfate (CARAFATE) tablet 1 g  1 g Oral TID WC & HS [name], MD      . temazepam (RESTORIL) capsule 15 mg  15 mg Oral QHS Gonzella Lex, MD   15 mg at 02/29/16 2050  . traMADol (ULTRAM) tablet 50 mg  50 mg Oral Q6H PRN Bettey Costa, MD   50 mg at 03/01/16 1258  . warfarin (COUMADIN) tablet 5 mg  5 mg Oral ONCE-1800 Bettey Costa, MD      . Warfarin - Pharmacist Dosing Inpatient   Does not apply q1800 Ramond Dial, RPH        Musculoskeletal: Strength & Muscle Tone: decreased Gait & Station: ataxic Patient leans: N/A  Psychiatric Specialty Exam: Physical Exam  Nursing note and vitals reviewed. Constitutional: She appears cachectic.  HENT:  Head: Normocephalic and atraumatic.  Eyes: Conjunctivae are normal. Pupils are equal, round, and reactive to light.  Neck: Normal range of motion.   Cardiovascular: Regular rhythm and normal heart sounds.   Respiratory: Effort normal.  GI: Soft.  Musculoskeletal: Normal range of motion.  Neurological: She is alert.  Skin: Skin is warm and dry.  Psychiatric: Judgment normal. Her affect is blunt. Her speech is delayed. She is slowed. Thought content is not paranoid. Cognition and memory are normal. She exhibits a depressed mood. She expresses suicidal ideation. She expresses no homicidal ideation. She expresses no suicidal plans.    Review of Systems  Constitutional: Positive for weight loss.  HENT: Negative.   Eyes: Negative.   Respiratory: Negative.   Cardiovascular: Negative.   Gastrointestinal: Negative.   Musculoskeletal: Negative.   Skin: Negative.   Neurological: Positive for weakness.  Psychiatric/Behavioral: Positive for depression, hallucinations and suicidal ideas. Negative for memory loss and substance abuse. The patient is nervous/anxious and has insomnia.     Blood pressure (!) 171/103, pulse (!) 103, temperature 99 F (37.2 C), temperature source Oral, resp. rate 18, height '5\' 6"'  (1.676 m), weight 54.3 kg (119 lb 11.4 oz), SpO2 94 %.Body mass index is 19.32 kg/m.  General Appearance: Fairly Groomed  Eye Contact:  Good  Speech:  Clear and Coherent  Volume:  Decreased  Mood:  Dysphoric  Affect:  Appropriate  Thought Process:  Goal Directed  Orientation:  Full (Time, Place, and Person)  Thought Content:  Logical  Suicidal Thoughts:  Yes.  without intent/plan  Homicidal Thoughts:  No  Memory:  Immediate;   Good Recent;   Fair Remote;   Fair  Judgement:  Fair  Insight:  Fair  Psychomotor Activity:  Decreased  Concentration:  Concentration: Fair  Recall:  [employer] of Knowledge:  Fair  Language:  Fair  Akathisia:  No  Handed:  Right  AIMS (if indicated):     Assets:  Communication Skills Desire for Improvement Housing Resilience Social Support  ADL's:  Intact  Cognition:  WNL  Sleep:         Treatment Plan Summary: Daily contact with patient to assess and evaluate symptoms and progress in treatment, Medication management and Plan 61 year old woman with multiple stresses including the recent death of a spouse. History of anxiety and depression. Admits to having made statements about wishing some time she could die but denies any intention or plan to act on suicide. She does mention to me that she has sometimes looked at her pill bottles and thought about how she could take them but has never come close to acting on it. As noted  above she has multiple positive things in her life that mitigate against it. I think the risk of suicide especially in the hospital is low. Does not need a sitter or suicide precautions. Discontinued the sitter. As far as treatment I have made some adjustments. Patient and her daughter and I discussed pros and cons of various medication approaches. I am going to increase her sertraline to 150 mg for the anxiety and depression rather than try immediately changing to a different medicine. I will leave the Xanax in place for now. I am adding Restoril 15 mg at night for sleep. Patient complains of sleeplessness as being one of the worst and most upsetting features of her current illness. I will follow-up regularly. Did some education and supportive counseling.   I will try to increase the dose of the Restoril a little bit. I had been under the impression she would be discharged today but it looks like she might possibly still be here another night. I think she would benefit from follow-up psychiatric treatment although this may be difficult to obtain at this point. She should definitely have follow-up from her primary care doctors and we can try to refer her to local resources for psychiatric care including [place] in [place]. No other change to antidepressants today. Supportive counseling with the patient. I feel she continues to be safe does not require commitment does not  require suicide watch. Disposition: Patient does not meet criteria for psychiatric inpatient admission. Supportive therapy provided about ongoing stressors.  Alethia Berthold, MD 03/01/2016 4:04 PM

## 2016-03-06 ENCOUNTER — Telehealth: Payer: Self-pay

## 2016-03-06 ENCOUNTER — Emergency Department: Payer: Medicare Other

## 2016-03-06 ENCOUNTER — Encounter: Payer: Self-pay | Admitting: Emergency Medicine

## 2016-03-06 ENCOUNTER — Emergency Department
Admission: EM | Admit: 2016-03-06 | Discharge: 2016-03-06 | Disposition: A | Discharge status: Home / Self Care | Payer: Medicare Other | Attending: Emergency Medicine | Admitting: Emergency Medicine

## 2016-03-06 DIAGNOSIS — R1011 Right upper quadrant pain: Secondary | ICD-10-CM | POA: Diagnosis present

## 2016-03-06 DIAGNOSIS — I5022 Chronic systolic (congestive) heart failure: Secondary | ICD-10-CM | POA: Diagnosis not present

## 2016-03-06 DIAGNOSIS — Z992 Dependence on renal dialysis: Secondary | ICD-10-CM | POA: Insufficient documentation

## 2016-03-06 DIAGNOSIS — I132 Hypertensive heart and chronic kidney disease with heart failure and with stage 5 chronic kidney disease, or end stage renal disease: Secondary | ICD-10-CM | POA: Diagnosis not present

## 2016-03-06 DIAGNOSIS — N186 End stage renal disease: Secondary | ICD-10-CM | POA: Insufficient documentation

## 2016-03-06 DIAGNOSIS — I10 Essential (primary) hypertension: Secondary | ICD-10-CM

## 2016-03-06 DIAGNOSIS — Z7901 Long term (current) use of anticoagulants: Secondary | ICD-10-CM | POA: Insufficient documentation

## 2016-03-06 DIAGNOSIS — Z79899 Other long term (current) drug therapy: Secondary | ICD-10-CM | POA: Insufficient documentation

## 2016-03-06 DIAGNOSIS — Z853 Personal history of malignant neoplasm of breast: Secondary | ICD-10-CM | POA: Insufficient documentation

## 2016-03-06 LAB — CBC WITH DIFFERENTIAL/PLATELET
BASOS PCT: 1 %
Basophils Absolute: 0 10*3/uL (ref 0–0.1)
EOS ABS: 0.2 10*3/uL (ref 0–0.7)
Eosinophils Relative: 3 %
HEMATOCRIT: 27.9 % — AB (ref 35.0–47.0)
HEMOGLOBIN: 9.6 g/dL — AB (ref 12.0–16.0)
LYMPHS ABS: 1.6 10*3/uL (ref 1.0–3.6)
Lymphocytes Relative: 30 %
MCH: 29.2 pg (ref 26.0–34.0)
MCHC: 34.5 g/dL (ref 32.0–36.0)
MCV: 84.9 fL (ref 80.0–100.0)
MONO ABS: 0.3 10*3/uL (ref 0.2–0.9)
MONOS PCT: 6 %
NEUTROS ABS: 3.2 10*3/uL (ref 1.4–6.5)
Neutrophils Relative %: 60 %
Platelets: 179 10*3/uL (ref 150–440)
RBC: 3.28 MIL/uL — ABNORMAL LOW (ref 3.80–5.20)
RDW: 16.1 % — AB (ref 11.5–14.5)
WBC: 5.3 10*3/uL (ref 3.6–11.0)

## 2016-03-06 LAB — COMPREHENSIVE METABOLIC PANEL
ALBUMIN: 3.7 g/dL (ref 3.5–5.0)
ALK PHOS: 224 U/L — AB (ref 38–126)
ALT: 18 U/L (ref 14–54)
AST: 21 U/L (ref 15–41)
Anion gap: 11 (ref 5–15)
BILIRUBIN TOTAL: 0.4 mg/dL (ref 0.3–1.2)
BUN: 30 mg/dL — AB (ref 6–20)
CALCIUM: 9.6 mg/dL (ref 8.9–10.3)
CO2: 24 mmol/L (ref 22–32)
Chloride: 99 mmol/L — ABNORMAL LOW (ref 101–111)
Creatinine, Ser: 6.5 mg/dL — ABNORMAL HIGH (ref 0.44–1.00)
GFR calc Af Amer: 7 mL/min — ABNORMAL LOW (ref >60)
GFR calc non Af Amer: 6 mL/min — ABNORMAL LOW (ref >60)
GLUCOSE: 99 mg/dL (ref 65–99)
Potassium: 3.4 mmol/L — ABNORMAL LOW (ref 3.5–5.1)
Sodium: 134 mmol/L — ABNORMAL LOW (ref 135–145)
TOTAL PROTEIN: 6.8 g/dL (ref 6.5–8.1)

## 2016-03-06 LAB — URINALYSIS, COMPLETE (UACMP) WITH MICROSCOPIC
BACTERIA UA: NONE SEEN
BILIRUBIN URINE: NEGATIVE
Glucose, UA: NEGATIVE mg/dL
Hgb urine dipstick: NEGATIVE
Ketones, ur: NEGATIVE mg/dL
Leukocytes, UA: NEGATIVE
NITRITE: NEGATIVE
PH: 8 (ref 5.0–8.0)
Protein, ur: 100 mg/dL — AB
RBC / HPF: NONE SEEN RBC/hpf (ref 0–5)
SPECIFIC GRAVITY, URINE: 1.003 — AB (ref 1.005–1.030)

## 2016-03-06 LAB — TROPONIN I: TROPONIN I: 0.03 ng/mL — AB (ref <0.03)

## 2016-03-06 LAB — LIPASE, BLOOD: Lipase: 32 U/L (ref 11–51)

## 2016-03-06 MED ORDER — ONDANSETRON HCL 4 MG/2ML IJ SOLN
4.0000 mg | Freq: Once | INTRAMUSCULAR | Status: AC
  Administered 2016-03-06: 4 mg via INTRAVENOUS
  Filled 2016-03-06: qty 2

## 2016-03-06 MED ORDER — LOSARTAN POTASSIUM 50 MG PO TABS
50.0000 mg | ORAL_TABLET | Freq: Once | ORAL | Status: AC
  Administered 2016-03-06: 50 mg via ORAL
  Filled 2016-03-06: qty 1

## 2016-03-06 MED ORDER — IOPAMIDOL (ISOVUE-300) INJECTION 61%
30.0000 mL | Freq: Once | INTRAVENOUS | Status: AC | PRN
  Administered 2016-03-06: 30 mL via ORAL

## 2016-03-06 MED ORDER — METOPROLOL TARTRATE 50 MG PO TABS
100.0000 mg | ORAL_TABLET | Freq: Once | ORAL | Status: AC
  Administered 2016-03-06: 100 mg via ORAL
  Filled 2016-03-06: qty 2

## 2016-03-06 MED ORDER — IOPAMIDOL (ISOVUE-300) INJECTION 61%
100.0000 mL | Freq: Once | INTRAVENOUS | Status: AC | PRN
  Administered 2016-03-06: 100 mL via INTRAVENOUS

## 2016-03-06 MED ORDER — MORPHINE SULFATE (PF) 4 MG/ML IV SOLN
4.0000 mg | Freq: Once | INTRAVENOUS | Status: AC
  Administered 2016-03-06: 4 mg via INTRAVENOUS
  Filled 2016-03-06: qty 1

## 2016-03-06 NOTE — ED Provider Notes (Addendum)
[facility] Emergency Department Provider Note ____________________________________________   I have reviewed the triage vital signs and the triage nursing note.  HISTORY  Chief Complaint Abdominal Pain   Historian Patient  HPI Teresa Cook is a 61 y.o. female comes by EMS from dialysis, she's been on dialysis for 4 years now, she had recent bilateral mastectomy several weeks ago with Dr. Burt Knack, states that this morning when she woke up she was having upper abdominal pain which is moderate to severe and crampy in nature. She thought it might go away. It was still not, when she went to dialysis and so she was sent to the ED for further evaluation. She's had numerous watery diarrhea bowel movements today, but states that that is not that unusual for her. The pain however is unusual. No chest pain or palpitations. She does make some urine. No fever. She's never had pains just like this before. She's not noticed any new redness or drainage around the mastectomy surgical sites. She does feel like there is some swelling at the right upper abdomen.    Past Medical History:  Diagnosis Date  . Anemia   . Anxiety   . Breast cancer ([place]) 01/2016   bilateral  . CHF (congestive heart failure) ([place])   . Chronic kidney disease   . Depression   . Dialysis patient ([place])   . DVT (deep venous thrombosis) (HCC)    left leg  . DVT (deep venous thrombosis) ([place]) 1985   right thigh  . Dysrhythmia   . Gout   . Headache   . HTN (hypertension)   . Hypertension   . Parathyroid abnormality ([place])   . Parathyroid disease ([place])   . Pneumonia 12/2015  . Psoriasis   . Renal insufficiency   . Sickle cell trait ([place])    traits    Patient Active Problem List   Diagnosis Date Noted  . HTN (hypertension) 02/29/2016  . Moderate recurrent major depression ([place]) 02/29/2016  . Panic attacks 02/29/2016  . Scalp lesion   . Breast cancer ([place]) 01/23/2016  . ESRD (end stage renal  disease) ([place]) 01/22/2016  . Severe anemia 01/01/2016  . Seizure ([place]) 12/31/2015  . Healthcare-associated pneumonia   . Bloodstream infection due to port-a-cath, initial encounter   . Sepsis ([place]) 12/15/2015  . Chest pain 08/31/2015  . SIRS (systemic inflammatory response syndrome) ([place]) 08/27/2015  . Malignant neoplasm of upper-inner quadrant of left breast in female, estrogen receptor positive ([place]) 08/05/2015  . Pulmonary edema 07/09/2015  . Dizziness 03/03/2015  . Seizures ([place]) 03/03/2015  . Spells 11/11/2014  . Acute respiratory failure with hypoxia ([place]) 09/19/2014  . Left ventricular dysfunction 09/06/2014  . Acute pulmonary edema ([place]) 08/30/2014  . Respiratory failure ([place]) 08/29/2014  . Respiratory difficulty 08/29/2014  . Chronic systolic heart failure ([place]) 05/16/2014  . Hypertension 05/16/2014  . Renal failure 05/16/2014  . Anxiety 05/16/2014  . Sickle cell trait ([place]) 03/03/2014  . ESRD on dialysis ([place]) 11/02/2013  . Dysgeusia 01/27/2013  . Weight loss 01/27/2013  . Resting tremor 08/13/2012  . Depression 04/30/2012  . 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 Right leg DVT ([place]) 04/30/2012    Past Surgical History:  Procedure Laterality Date  . ABDOMINAL HYSTERECTOMY  1980  . APPENDECTOMY    . BREAST BIOPSY Left 10/28/2013   benign  . BREAST EXCISIONAL BIOPSY Left 2002   benign  . INSERTION OF DIALYSIS CATHETER  2014  . LIPOMA EXCISION N/A 01/23/2016  Procedure: EXCISION LIPOMA;  Surgeon: Hubbard Robinson, MD;  Location: [facility] ORS;  Service: General;  Laterality: N/A;  . MASTECTOMY W/ SENTINEL NODE BIOPSY Bilateral 01/23/2016   Procedure: bilateral MASTECTOMY WITH  bilateral SENTINEL LYMPH NODE BIOPSY possible left axillary node dissection forehead lipoma removal;  Surgeon: Hubbard Robinson, MD;  Location: [facility] ORS;  Service: General;  Laterality: Bilateral;  . PARTIAL HYSTERECTOMY    . PERIPHERAL VASCULAR CATHETERIZATION N/A 12/25/2015    Procedure: Dialysis/Perma Catheter Insertion;  Surgeon: Algernon Huxley, MD;  Location: [place] [facility];  Service: Cardiovascular;  Laterality: N/A;  . PERIPHERAL VASCULAR CATHETERIZATION Left 01/22/2016   Procedure: Dialysis/Perma Catheter Insertion;  Surgeon: Algernon Huxley, MD;  Location: [place] CV LAB;  Service: Cardiovascular;  Laterality: Left;  . PERIPHERAL VASCULAR CATHETERIZATION N/A 01/26/2016   Procedure: Dialysis/Perma Catheter Insertion;  Surgeon: Katha Cabal, MD;  Location: [place] CV LAB;  Service: Cardiovascular;  Laterality: N/A;  . PORT-A-CATH REMOVAL N/A 12/20/2015   Procedure: REMOVAL PORT-A-CATH;  Surgeon: Hubbard Robinson, MD;  Location: [facility] ORS;  Service: General;  Laterality: N/A;  left    . PORTACATH PLACEMENT Left 08/21/2015   Procedure: INSERTION PORT-A-CATH;  Surgeon: Hubbard Robinson, MD;  Location: [facility] ORS;  Service: General;  Laterality: Left;  . REMOVAL OF A DIALYSIS CATHETER  2017    Prior to Admission medications   Medication Sig Start Date End Date Taking? Authorizing Provider  acetaminophen (TYLENOL) 325 MG tablet Take 2 tablets (650 mg total) by mouth every 6 (six) hours as needed for mild pain (or Fever >/= 101). 12/27/15  Yes [name], MD  ALPRAZolam (XANAX) 0.5 MG tablet Take 1 tablet (0.5 mg total) by mouth 2 (two) times daily as needed for anxiety. 12/27/15  Yes [name], MD  amLODipine (NORVASC) 10 MG tablet Take 1 tablet (10 mg total) by mouth daily before supper. Take after checking blood pressure on Dialysis days, avoid taking it- if systolic BP < 932. 3/55/73  Yes [name], MD  feeding supplement, ENSURE ENLIVE, (ENSURE ENLIVE) LIQD Take 237 mLs by mouth 3 (three) times daily between meals. Patient taking differently: Take 237 mLs by mouth daily.  12/27/15  Yes [name], MD  levETIRAcetam (KEPPRA) 500 MG tablet Take 1 tablet (500 mg total) by mouth daily. Patient taking differently: Take 500 mg by mouth 2  (two) times daily.  03/01/16  Yes [name], MD  losartan (COZAAR) 50 MG tablet Take 50-100 mg by mouth daily. Takes 100 MG DAILY on non-dialysis days=monday, Wednesday and Friday 09/07/14  Yes Historical Provider, MD  metoprolol (LOPRESSOR) 100 MG tablet Take 0.5 tablets (50 mg total) by mouth 2 (two) times daily. Takes on non-dialysis days=Tuesday, Thursday, Saturday and Sunday. Patient taking differently: Take 100 mg by mouth 2 (two) times daily. Takes 100 MG DAILY on non-dialysis days= Monday, Wednesday, Friday 12/27/15  Yes [name], MD  ondansetron (ZOFRAN) 4 MG tablet Take 1 tablet (4 mg total) by mouth every 6 (six) hours as needed for nausea. Patient taking differently: Take 8 mg by mouth every 8 (eight) hours as needed for nausea.  03/01/16  Yes [name], MD  prochlorperazine (COMPAZINE) 10 MG tablet Take 10 mg by mouth every 6 (six) hours as needed for nausea or vomiting.   Yes Historical Provider, MD  Scar Treatment Products [facility]) GEL Apply 1 application topically 2 (two) times daily. 02/15/16  Yes [name], MD  senna (SENOKOT) 8.6 MG TABS  tablet Take 1 tablet by mouth daily.   Yes Historical Provider, MD  sertraline (ZOLOFT) 50 MG tablet Take 3 tablets (150 mg total) by mouth daily. Patient taking differently: Take 200 mg by mouth daily.  03/01/16  Yes [name], MD  sevelamer carbonate (RENVELA) 800 MG tablet Take 800 mg by mouth 4 (four) times daily. Tid with meals and with 1 snack   Yes Historical Provider, MD  temazepam (RESTORIL) 15 MG capsule Take 1 capsule (15 mg total) by mouth at bedtime. 03/01/16  Yes [name], MD  traMADol (ULTRAM) 50 MG tablet Take 1 tablet (50 mg total) by mouth every 6 (six) hours as needed for moderate pain. 02/14/16  Yes Merlyn Lot, MD  warfarin (COUMADIN) 5 MG tablet tk 1 tab on sunday, friday, saturday and 1.5 tabs on the other days 11/03/13  Yes Historical Provider, MD  zolpidem (AMBIEN) 5 MG  tablet Take 1 tablet (5 mg total) by mouth at bedtime. Patient taking differently: Take 10 mg by mouth at bedtime.  03/01/16  Yes [name], MD  letrozole [facility]) 2.5 MG tablet Take 1 tablet (2.5 mg total) by mouth daily. Patient not taking: Reported on 03/06/2016 02/06/16   [name], MD  pantoprazole (PROTONIX) 40 MG tablet Take 1 tablet (40 mg total) by mouth daily. Patient not taking: Reported on 03/06/2016 03/01/16   [name], MD  sucralfate (CARAFATE) 1 g tablet Take 1 tablet (1 g total) by mouth 4 (four) times daily -  with meals and at bedtime. Patient not taking: Reported on 03/06/2016 03/01/16   [name], MD    Allergies  Allergen Reactions  . Gabapentin Other (See Comments)    Seizure  . Adhesive [Tape] Itching    Silk tape is ok to use.    Family History  Problem Relation Age of Onset  . Stroke Mother   . CVA Mother   . Hypertension Mother   . Hypertension Father   . Hypertension Sister   . Diabetes Sister   . Cancer Sister 67    Breast  . Stroke Brother   . Hypertension Brother   . Breast cancer Maternal Aunt 70    Social History Social History  Substance Use Topics  . Smoking status: Never Smoker  . Smokeless tobacco: Never Used  . Alcohol use No    Review of Systems  Constitutional: Negative for fever. Eyes: Negative for visual changes. ENT: Negative for sore throat. Cardiovascular: Negative for chest pain. Respiratory: Negative for shortness of breath. Gastrointestinal: Negative for vomiting. Genitourinary: Negative for dysuria. Musculoskeletal: Negative for back pain.  Some mild right flank pain. Skin: Negative for rash. Neurological: Negative for headache. 10 point Review of Systems otherwise negative ____________________________________________   PHYSICAL EXAM:  VITAL SIGNS: ED Triage Vitals  Enc Vitals Group     BP 03/06/16 1115 (!) 202/130     Pulse Rate 03/06/16 1115 87     Resp 03/06/16  1115 16     Temp 03/06/16 1115 98.6 F (37 C)     Temp Source 03/06/16 1115 Oral     SpO2 03/06/16 1115 100 %     Weight 03/06/16 1119 125 lb (56.7 kg)     Height 03/06/16 1119 5\' 6"  (1.676 m)     Head Circumference --      Peak Flow --      Pain Score 03/06/16 1115 8     Pain Loc --      Pain Edu? --  Excl. in [place]? --      Constitutional: Alert and oriented. Well appearing and in no distress. HEENT   Head: Normocephalic and atraumatic.      Eyes: Conjunctivae are normal. PERRL. Normal extraocular movements.      Ears:         Nose: No congestion/rhinnorhea.   Mouth/Throat: Mucous membranes are moist.   Neck: No stridor. Cardiovascular/Chest: Normal rate, regular rhythm.  No murmurs, rubs, or gallops. Respiratory: Normal respiratory effort without tachypnea nor retractions. Breath sounds are clear and equal bilaterally. No wheezes/rales/rhonchi. Gastrointestinal: Soft. No distention, no guarding, no rebound. Moderate tenderness diffusely especially in the right upper abdomen. No clearly appreciable swelling. No fluid wave. No skin erythema.  Genitourinary/rectal:Deferred Musculoskeletal: Nontender with normal range of motion in all extremities. No joint effusions.  No lower extremity tenderness.  No edema. Neurologic:  Normal speech and language. No gross or focal neurologic deficits are appreciated. Skin:  Skin is warm, dry and intact. No rash noted.  Bilateral chest mastectomy scars have some glue peeling, the incisions look clean dry and intact without drainage or redness or induration or tenderness. Psychiatric: Mood and affect are normal. Speech and behavior are normal. Patient exhibits appropriate insight and judgment.   ____________________________________________  LABS (pertinent positives/negatives)  Labs Reviewed  COMPREHENSIVE METABOLIC PANEL - Abnormal; Notable for the following:       Result Value   Sodium 134 (*)    Potassium 3.4 (*)    Chloride 99  (*)    BUN 30 (*)    Creatinine, Ser 6.50 (*)    Alkaline Phosphatase 224 (*)    GFR calc non Af Amer 6 (*)    GFR calc Af Amer 7 (*)    All other components within normal limits  CBC WITH DIFFERENTIAL/PLATELET - Abnormal; Notable for the following:    RBC 3.28 (*)    Hemoglobin 9.6 (*)    HCT 27.9 (*)    RDW 16.1 (*)    All other components within normal limits  URINALYSIS, COMPLETE (UACMP) WITH MICROSCOPIC - Abnormal; Notable for the following:    Color, Urine STRAW (*)    APPearance CLEAR (*)    Specific Gravity, Urine 1.003 (*)    Protein, ur 100 (*)    Squamous Epithelial / LPF 0-5 (*)    All other components within normal limits  TROPONIN I - Abnormal; Notable for the following:    Troponin I 0.03 (*)    All other components within normal limits  LIPASE, BLOOD    ____________________________________________    EKG I, Lisa Roca, MD, the attending physician have personally viewed and interpreted all ECGs.  77 beats per minute.  normal sinus rhythm. We be underlying baseline. Narrow QRS. Nonspecific T-wave  Similar prior EKG. ____________________________________________  RADIOLOGY All Xrays were viewed by me. Imaging interpreted by Radiologist.  Chest x-ray with abdomen: No acute abnormality  Ultrasound right upper quadrant abdomen limited:  IMPRESSION: Small right kidney. Right kidney is echogenic. These are findings that may be indicative of medical renal disease. Appropriate laboratory correlation advised.  No gallbladder, bile duct, or liver pathology demonstrated on this Study.  CT abd pelvis with contrast:  IMPRESSION: 1. No acute findings in the abdomen or pelvis to explain the patient's history of abdominal pain and swelling. 2. Trace intraperitoneal free fluid identified in the cul-de-sac with no identifiable a etiology by CT. 3. Atrophy of the kidneys bilaterally compatible the history of chronic renal disease. Numerous hypoattenuating  renal  lesions bilaterally are too small to characterize but are likely cysts. 4.  Abdominal Aortic Atherosclerois (ICD10-170.0)  __________________________________________  PROCEDURES  Procedure(s) performed: None  Critical Care performed: None  ____________________________________________   ED COURSE / ASSESSMENT AND PLAN  Pertinent labs & imaging results that were available during my care of the patient were reviewed by me and considered in my medical decision making (see chart for details).   Teresa Cook is here for abdominal pain especially in the right upper quadrant.  She keeps pointing to her epigastrium and right side stating that she thinks she swollen. She might have a slight amount of body wall edema, but no distention of the abdomen.  Laboratory studies are overall reassuring, does not need emergency dialysis with potassium looking okay today.  Right upper quadrant ultrasound is reassuring. I discussed with her obtaining CT. She is going to receive CT dye, patient to call dialysis tomorrow to make dialysis.  CT abdomen and pelvis without certain cause for abdominal pain or right-sided abdominal pain.  On reexamination Route 4:45 PM, patient feels much improved. No tenderness to palpation at this point time. She is okay for discharge home.  She did have some elevated blood pressures here, but received her home medications. No chest pain or shortness of breath, or nausea.  CONSULTATIONS:   None   Patient / Family / Caregiver informed of clinical course, medical decision-making process, and agree with plan.   I discussed return precautions, follow-up instructions, and discharge instructions with patient and/or family.   ___________________________________________   FINAL CLINICAL IMPRESSION(S) / ED DIAGNOSES   Final diagnoses:  Right upper quadrant abdominal pain  Hypertension, unspecified type              Note: This dictation was prepared with  Dragon dictation. Any transcriptional errors that result from this process are unintentional    Lisa Roca, MD 03/06/16 [place], MD 03/06/16 [phone]

## 2016-03-06 NOTE — ED Notes (Signed)
Pt updated that MD placed orders for [place], will keep pt updated. Pt verbalizes understanding.

## 2016-03-06 NOTE — ED Notes (Signed)
Patient transported to CT 

## 2016-03-06 NOTE — ED Triage Notes (Addendum)
Pt to  ED via EMS from dialysis center, pt c/o abd pain with swelling. Pt did not receive dialysis today, EMS was called before. Per EMS pt HTN in 180s/90, hr 80s, temp 99.1. Pt A&Ox4. Pt states she has been having nausea this am

## 2016-03-06 NOTE — ED Notes (Signed)
Patient transported to Ultrasound 

## 2016-03-06 NOTE — ED Notes (Signed)
MD aware of pt BP, home meds given, pt ok to discharge

## 2016-03-06 NOTE — ED Notes (Signed)
ED Provider at bedside. 

## 2016-03-06 NOTE — ED Notes (Signed)
Patient transported to X-ray 

## 2016-03-06 NOTE — Telephone Encounter (Signed)
Patient had a bilateral mastectomy with bilateral sentinel lymph node biopsy on 01/23/16 with Dr. Azalee Course. She is calling today because both sides of her ribs are tightening every time she breaths. It looks like her ribs are swollen out. It feels almost like a strain. Patients temperature is at 99.1. Patient declines chills, nausea and vomiting. I spoke with a nurse, patient has been advised to go to the waiting room because of the tightness on her chest. Patient understood.

## 2016-03-06 NOTE — Discharge Instructions (Signed)
You were evaluated for abdominal discomfort and swelling, and although no certain cause was found, your examine evaluation are reassuring in the emergency department stay.  Return to the emergency department immediately for any worsening abdominal pain, black or bloody stool, fever, vomiting, chest pain, skin rash, or any other symptoms concerning to you.  He did receive contrast dye today and need to have dialysis tomorrow.  Please call [facility] in the morning to schedule session.

## 2016-03-06 NOTE — ED Notes (Signed)
MD notified of BP 202/119, see MAR

## 2016-03-06 NOTE — ED Notes (Signed)
Pt states unable to take BP meds without eating, MD notified. Will give meds after CT scan

## 2016-03-06 NOTE — ED Notes (Signed)
Pt given meal tray with meds, pt calling for ride

## 2016-03-06 NOTE — ED Notes (Signed)
Pt given warm blanket at this time and notified of needed urine specimen, pt verbalizes understanding

## 2016-03-06 NOTE — ED Notes (Signed)
Pt completed PO contrast, CT called.

## 2016-03-11 ENCOUNTER — Other Ambulatory Visit: Payer: Self-pay

## 2016-03-12 ENCOUNTER — Encounter: Payer: Self-pay | Admitting: Surgery

## 2016-03-12 ENCOUNTER — Ambulatory Visit (INDEPENDENT_AMBULATORY_CARE_PROVIDER_SITE_OTHER): Payer: Medicare Other | Admitting: Surgery

## 2016-03-12 VITALS — BP 192/118 | HR 86 | Temp 98.2°F | Ht 66.0 in | Wt 128.2 lb

## 2016-03-12 DIAGNOSIS — Z170 Estrogen receptor positive status [ER+]: Secondary | ICD-10-CM

## 2016-03-12 DIAGNOSIS — C50212 Malignant neoplasm of upper-inner quadrant of left female breast: Secondary | ICD-10-CM

## 2016-03-12 NOTE — Progress Notes (Signed)
Patient feels much better today. She has no complaints at this time  Wounds are healing well no erythema minimal seroma on the right side. On the left there is a large dog ear posteriorly and a large eschar across the anterior mastectomy wound on the left.  No erythema no drainage  Patient doing very well minimal amount of silver nitrate is used medially on the right wound otherwise she is doing quite well seromas are resolving. I will ask her to follow-up in my office in 10d-2 weeks

## 2016-03-12 NOTE — Patient Instructions (Signed)
We will see you back in 2 weeks.    

## 2016-03-13 ENCOUNTER — Ambulatory Visit: Admission: RE | Admit: 2016-03-13 | Payer: Medicare Other | Source: Ambulatory Visit

## 2016-03-14 ENCOUNTER — Encounter: Payer: Self-pay | Admitting: Surgery

## 2016-04-02 ENCOUNTER — Ambulatory Visit (INDEPENDENT_AMBULATORY_CARE_PROVIDER_SITE_OTHER): Payer: Medicare Other | Admitting: Surgery

## 2016-04-02 ENCOUNTER — Encounter: Payer: Self-pay | Admitting: Surgery

## 2016-04-02 VITALS — BP 161/102 | HR 74 | Temp 98.2°F | Wt 120.0 lb

## 2016-04-02 DIAGNOSIS — C50212 Malignant neoplasm of upper-inner quadrant of left female breast: Secondary | ICD-10-CM

## 2016-04-02 DIAGNOSIS — Z170 Estrogen receptor positive status [ER+]: Secondary | ICD-10-CM

## 2016-04-02 NOTE — Patient Instructions (Addendum)
Please give [place] a call if you have any questions or concerns.   At this time you are able to let soap and water run on your wounds.  We will send your prescription to a Medical Supply store [facility]) so you could get your prosthesis bra.

## 2016-04-02 NOTE — Progress Notes (Signed)
Outpatient postop visit  04/02/2016  Teresa Cook is an 61 y.o. female.    Procedure: Bilateral mastectomy  CC: Poor appetite  HPI: Patient's wounds are healing well she has no complaints concerning that she is not eating well  Medications reviewed.    Physical Exam:  BP (!) 161/102   Pulse 74   Temp 98.2 F (36.8 C) (Oral)   Wt 120 lb (54.4 kg)   BMI 19.37 kg/m     PE: Patient appears stronger than she hasn't passed and in no acute distress. Wounds are healed with no erythema no further drainage no seroma noted dog ears are present posteriorly and hypopigmentation especially on the left is noted    Assessment/Plan:  Patient doing quite well I discussed with her nutritional improvements etc. but she does not need to follow up with [place] she can follow up with her primary care physician and her nephrologist.  [name], MD, FACS

## 2016-04-03 MED ORDER — UNABLE TO FIND: 0 refills | Status: DC

## 2016-04-03 NOTE — Addendum Note (Signed)
Addended by: Wayna Chalet on: 04/03/2016 12:49 PM   Modules accepted: Orders

## 2016-04-23 ENCOUNTER — Telehealth: Payer: Self-pay

## 2016-04-23 MED ORDER — UNABLE TO FIND: 0 refills | Status: DC

## 2016-04-23 NOTE — Addendum Note (Signed)
Addended by: Wayna Chalet on: 04/23/2016 05:14 PM   Modules accepted: Orders

## 2016-04-23 NOTE — Telephone Encounter (Signed)
Patient called wanting to know about her prosthetic bra order. I told her that I had faxed the order to [facility] on 04/03/2015. I also told her that [facility] had told me that they would call her to place the order. However, patient stated that she had not heard from them. Therefore, I told her that I would refax the order and to please give them a call tomorrow and ask them about her order.  I told patient that I will follow up with her and [facility] in a week to make sure that it was taken care of. Patient understood and had no further questions.

## 2016-04-30 ENCOUNTER — Ambulatory Visit: Payer: Medicare Other

## 2016-05-06 NOTE — Progress Notes (Signed)
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  INTERVAL HISTORY: Patient returns to clinic today for routine 3 month evaluation. She is now fully healed from her double mastectomy. Letrozole was recommended and called in, but patient never received a prescription. She currently feels well and is at her baseline. She continues to receive dialysis on Mondays, Wednesdays, and Fridays. She has no neurologic complaints. She denies any recent fevers. She has a good appetite and denies weight loss. She has no chest pain or shortness of breath. She denies any nausea, vomiting, constipation, or diarrhea. Patient offers no further specific complaints.  REVIEW OF SYSTEMS:   Review of Systems  Constitutional: Negative for fever, malaise/fatigue and weight loss.  Respiratory: Negative.  Negative for cough and shortness of breath.   Cardiovascular: Negative.  Negative for chest pain and leg swelling.  Gastrointestinal: Negative.  Negative for abdominal pain.  Genitourinary: Negative.   Musculoskeletal: Negative.   Skin: Negative.  Negative for rash.  Neurological: Negative.  Negative for sensory change and weakness.  Psychiatric/Behavioral: The patient is not nervous/anxious.     As per HPI. Otherwise, a complete review of systems is negative.  PAST MEDICAL HISTORY: Past Medical History:  Diagnosis Date  . Anemia   . Anxiety   . Breast cancer ([place]) 01/2016   bilateral  . CHF (congestive heart failure) ([place])   . Chronic kidney disease   . Depression   .  Dialysis patient ([place])   . DVT (deep venous thrombosis) ([facility])    left leg  . DVT (deep venous thrombosis) ([place]) 1985   right thigh  . Dysrhythmia   . Gout   . Headache   . HTN (hypertension)   . Hypertension   . Parathyroid abnormality ([place])   . Parathyroid disease ([place])   . Pneumonia 12/2015  . Psoriasis   . Renal insufficiency   . Sickle cell trait ([place])    traits    PAST SURGICAL HISTORY: Past Surgical History:  Procedure Laterality Date  . ABDOMINAL HYSTERECTOMY  1980  . APPENDECTOMY    . BREAST BIOPSY Left 10/28/2013   benign  . BREAST EXCISIONAL BIOPSY Left 2002   benign  . INSERTION OF DIALYSIS CATHETER  2014  . LIPOMA EXCISION N/A 01/23/2016   Procedure: EXCISION LIPOMA;  Surgeon: Hubbard Robinson, MD;  Location: [facility];  Service: General;  Laterality: N/A;  . MASTECTOMY W/ SENTINEL NODE BIOPSY Bilateral 01/23/2016   Procedure: bilateral MASTECTOMY WITH  bilateral SENTINEL LYMPH NODE BIOPSY possible left axillary node dissection forehead lipoma removal;  Surgeon: Hubbard Robinson, MD;  Location: [facility];  Service: General;  Laterality: Bilateral;  . PARTIAL HYSTERECTOMY    . PERIPHERAL VASCULAR CATHETERIZATION N/A 12/25/2015   Procedure: Dialysis/Perma Catheter Insertion;  Surgeon: Algernon Huxley, MD;  Location: [facility];  Service: Cardiovascular;  Laterality: N/A;  . PERIPHERAL VASCULAR CATHETERIZATION Left 01/22/2016   Procedure: Dialysis/Perma Catheter Insertion;  Surgeon: Algernon Huxley, MD;  Location: [facility];  Service: Cardiovascular;  Laterality: Left;  . PERIPHERAL VASCULAR CATHETERIZATION N/A 01/26/2016   Procedure: Dialysis/Perma Catheter  Insertion;  Surgeon: Katha Cabal, MD;  Location: [facility];  Service: Cardiovascular;  Laterality: N/A;  . PORT-A-CATH REMOVAL N/A 12/20/2015   Procedure: REMOVAL PORT-A-CATH;  Surgeon: Hubbard Robinson, MD;  Location: [facility];  Service: General;  Laterality: N/A;  left    .  PORTACATH PLACEMENT Left 08/21/2015   Procedure: INSERTION PORT-A-CATH;  Surgeon: Hubbard Robinson, MD;  Location: [facility];  Service: General;  Laterality: Left;  . REMOVAL OF A DIALYSIS CATHETER  2017    FAMILY HISTORY: Family History  Problem Relation Age of Onset  . Stroke Mother   . CVA Mother   . Hypertension Mother   . Hypertension Father   . Hypertension Sister   . Diabetes Sister   . Cancer Sister 37    Breast  . Stroke Brother   . Hypertension Brother   . Breast cancer Maternal Aunt 1       ADVANCED DIRECTIVES:    HEALTH MAINTENANCE: Social History  Substance Use Topics  . Smoking status: Never Smoker  . Smokeless tobacco: Never Used  . Alcohol use No     Colonoscopy:  PAP:  Bone density:  Lipid panel:  Allergies  Allergen Reactions  . Gabapentin Other (See Comments)    Seizure  . Adhesive [Tape] Itching    Silk tape is ok to use.    Current Outpatient Prescriptions  Medication Sig Dispense Refill  . acetaminophen (TYLENOL) 325 MG tablet Take 2 tablets (650 mg total) by mouth every 6 (six) hours as needed for mild pain (or Fever >/= 101).    Marland Kitchen ALPRAZolam (XANAX) 0.5 MG tablet Take 1 tablet (0.5 mg total) by mouth 2 (two) times daily as needed for anxiety. 20 tablet 0  . amLODipine (NORVASC) 10 MG tablet Take 1 tablet (10 mg total) by mouth daily before supper. Take after checking blood pressure on Dialysis days, avoid taking it- if systolic BP < 867. 30 tablet 0  . feeding supplement, ENSURE ENLIVE, (ENSURE ENLIVE) LIQD Take 237 mLs by mouth 3 (three) times daily between meals. (Patient taking differently: Take 237 mLs by mouth daily. ) 237 mL 12  . letrozole (FEMARA) 2.5 MG tablet Take 1 tablet (2.5 mg total) by mouth daily. 30 tablet 3  . levETIRAcetam (KEPPRA) 500 MG tablet Take 1 tablet (500 mg total) by mouth daily. (Patient taking differently: Take 500 mg by mouth 2 (two) times daily. ) 30 tablet 0  . losartan (COZAAR) 50 MG tablet Take 50-100  mg by mouth daily. Takes 100 MG DAILY on non-dialysis days=monday, Wednesday and Friday    . metoprolol (LOPRESSOR) 100 MG tablet Take 0.5 tablets (50 mg total) by mouth 2 (two) times daily. Takes on non-dialysis days=Tuesday, Thursday, Saturday and Sunday. (Patient taking differently: Take 100 mg by mouth 2 (two) times daily. Takes 100 MG DAILY on non-dialysis days= Monday, Wednesday, Friday) 60 tablet 0  . ondansetron (ZOFRAN) 4 MG tablet Take 1 tablet (4 mg total) by mouth every 6 (six) hours as needed for nausea. (Patient taking differently: Take 8 mg by mouth every 8 (eight) hours as needed for nausea. ) 30 tablet 0  . prochlorperazine (COMPAZINE) 10 MG tablet Take 10 mg by mouth every 6 (six) hours as needed for nausea or vomiting.    . Scar Treatment Products (MEDERMA) GEL Apply 1 application topically 2 (two) times daily. 50 g 2  . senna (SENOKOT) 8.6 MG TABS tablet Take 1 tablet by mouth daily.    Marland Kitchen  sertraline (ZOLOFT) 50 MG tablet Take 3 tablets (150 mg total) by mouth daily. (Patient taking differently: Take 200 mg by mouth daily. ) 90 tablet 0  . sevelamer carbonate (RENVELA) 800 MG tablet Take 800 mg by mouth 4 (four) times daily. Tid with meals and with 1 snack    . temazepam (RESTORIL) 15 MG capsule Take 1 capsule (15 mg total) by mouth at bedtime. 30 capsule 0  . traMADol (ULTRAM) 50 MG tablet Take 1 tablet (50 mg total) by mouth every 6 (six) hours as needed for moderate pain. 10 tablet 0  . traZODone (DESYREL) 50 MG tablet Take 1 tablet by mouth 1 day or 1 dose.    Marland Kitchen UNABLE TO FIND Med Name: Prosthetic Bra with Inserts 2 each 0  . warfarin (COUMADIN) 5 MG tablet tk 1 tab on sunday, friday, saturday and 1.5 tabs on the other days    . zolpidem (AMBIEN) 5 MG tablet Take 1 tablet (5 mg total) by mouth at bedtime. (Patient taking differently: Take 10 mg by mouth at bedtime. ) 30 tablet 0  . pantoprazole (PROTONIX) 40 MG tablet Take 1 tablet (40 mg total) by mouth daily. (Patient not  taking: Reported on 05/07/2016) 30 tablet 0  . sucralfate (CARAFATE) 1 g tablet Take 1 tablet (1 g total) by mouth 4 (four) times daily -  with meals and at bedtime. (Patient not taking: Reported on 05/07/2016) 100 tablet 0   No current facility-administered medications for this visit.     OBJECTIVE: Vitals:   05/07/16 1425  BP: 128/81  Pulse: 80  Resp: 18  Temp: 98.4 F (36.9 C)     Body mass index is 19.75 kg/m.    ECOG FS:0 - Asymptomatic  General: Well-developed, well-nourished, no acute distress. Eyes: Pink conjunctiva, anicteric sclera. Breasts: Bilateral mastectomy. Lungs: Clear to auscultation bilaterally. Chest wall: Dialysis catheter in right chest wall, port noted in left chest wall. Heart: Regular rate and rhythm. No rubs, murmurs, or gallops. Abdomen: Soft, nontender, nondistended. No organomegaly noted, normoactive bowel sounds. Musculoskeletal: No edema, cyanosis, or clubbing. Neuro: Alert, answering all questions appropriately. Cranial nerves grossly intact. Skin: No rashes or petechiae noted. Psych: Normal affect.   LAB RESULTS:  Lab Results  Component Value Date   NA 134 (L) 03/06/2016   K 3.4 (L) 03/06/2016   CL 99 (L) 03/06/2016   CO2 24 03/06/2016   GLUCOSE 99 03/06/2016   BUN 30 (H) 03/06/2016   CREATININE 6.50 (H) 03/06/2016   CALCIUM 9.6 03/06/2016   PROT 6.8 03/06/2016   ALBUMIN 3.7 03/06/2016   AST 21 03/06/2016   ALT 18 03/06/2016   ALKPHOS 224 (H) 03/06/2016   BILITOT 0.4 03/06/2016   GFRNONAA 6 (L) 03/06/2016   GFRAA 7 (L) 03/06/2016    Lab Results  Component Value Date   WBC 5.3 03/06/2016   NEUTROABS 3.2 03/06/2016   HGB 9.6 (L) 03/06/2016   HCT 27.9 (L) 03/06/2016   MCV 84.9 03/06/2016   PLT 179 03/06/2016     STUDIES: No results found.  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  PLAN:    1. Pathologic stage IIa ER/PR positive HER-2  negative adenocarcinoma of the upper inner quadrant of the left breast: Patient completed her neoadjuvant chemotherapy with Taxotere and Cytoxan on October 26, 2015. Her surgery was delayed secondary to hospital admissions, but she ultimately decided to proceed with a double mastectomy,  which was done on January 23, 2016. Because of this, she does not require XRT. Patient was once again given a prescription for letrozole today which she will be required to take for 5 years completing in May 2023. Patient has indicated that she may not want to proceed with additional medication, but is willing to give it a try for several months. Return to clinic in 3 months for further evaluation. If patient continues to refuse to take letrozole, she possibly could be discharged from clinic with continued follow-up with her PCP.    2. End-stage renal disease: Continue dialysis on Monday, Wednesday, and Fridays. 3. Anemia: Likely multifactorial. Monitor. 4. Postmenopausal: Will get baseline bone mineral density in the future if patient remains compliant with letrozole. 5. Hypertension: Improved, monitor.   Approximately 30 minutes was spent in discussion of which greater than 50% was consultation.   Patient expressed understanding and was in agreement with this plan. She also understands that She can call clinic at any time with any questions, concerns, or complaints.   Cancer Staging Malignant neoplasm of upper-inner quadrant of left breast in female, estrogen receptor positive ([place]) Staging form: Breast, AJCC 7th Edition - Pathologic stage from 02/05/2016: Stage IIA (T1b, N1a, cM0) - Signed by [name], MD on 02/05/2016   [name], MD   05/08/2016 3:10 PM

## 2016-05-07 ENCOUNTER — Inpatient Hospital Stay: Payer: Medicare Other

## 2016-05-07 ENCOUNTER — Inpatient Hospital Stay: Payer: Medicare Other | Attending: Oncology | Admitting: Oncology

## 2016-05-07 VITALS — BP 128/81 | HR 80 | Temp 98.4°F | Resp 18 | Wt 122.4 lb

## 2016-05-07 DIAGNOSIS — I509 Heart failure, unspecified: Secondary | ICD-10-CM | POA: Diagnosis not present

## 2016-05-07 DIAGNOSIS — Z9013 Acquired absence of bilateral breasts and nipples: Secondary | ICD-10-CM | POA: Diagnosis not present

## 2016-05-07 DIAGNOSIS — M109 Gout, unspecified: Secondary | ICD-10-CM | POA: Diagnosis not present

## 2016-05-07 DIAGNOSIS — Z992 Dependence on renal dialysis: Secondary | ICD-10-CM | POA: Diagnosis not present

## 2016-05-07 DIAGNOSIS — C50212 Malignant neoplasm of upper-inner quadrant of left female breast: Secondary | ICD-10-CM | POA: Diagnosis present

## 2016-05-07 DIAGNOSIS — Z780 Asymptomatic menopausal state: Secondary | ICD-10-CM | POA: Insufficient documentation

## 2016-05-07 DIAGNOSIS — F329 Major depressive disorder, single episode, unspecified: Secondary | ICD-10-CM | POA: Insufficient documentation

## 2016-05-07 DIAGNOSIS — F419 Anxiety disorder, unspecified: Secondary | ICD-10-CM | POA: Diagnosis not present

## 2016-05-07 DIAGNOSIS — N186 End stage renal disease: Secondary | ICD-10-CM | POA: Insufficient documentation

## 2016-05-07 DIAGNOSIS — Z170 Estrogen receptor positive status [ER+]: Secondary | ICD-10-CM

## 2016-05-07 DIAGNOSIS — I132 Hypertensive heart and chronic kidney disease with heart failure and with stage 5 chronic kidney disease, or end stage renal disease: Secondary | ICD-10-CM | POA: Diagnosis not present

## 2016-05-07 DIAGNOSIS — Z79811 Long term (current) use of aromatase inhibitors: Secondary | ICD-10-CM

## 2016-05-07 DIAGNOSIS — D573 Sickle-cell trait: Secondary | ICD-10-CM | POA: Diagnosis not present

## 2016-05-07 MED ORDER — LETROZOLE 2.5 MG PO TABS: 2.5000 mg | ORAL_TABLET | Freq: Every day | ORAL | 3 refills | Status: DC

## 2016-05-07 NOTE — Progress Notes (Signed)
Patient states she has tightness in her chest.  Patient states she has a cardiologist and has seen him for this problem.  She does not agree with what he told her.  Wants to discuss today.

## 2016-05-07 NOTE — Progress Notes (Signed)
Survivorship Care Plan visit completed.  Treatment summary reviewed and given to patient.  ASCO answers booklet reviewed and given to patient.  CARE program and Cancer Transitions discussed with patient along with other resources cancer center offers to patients and caregivers.  Patient verbalized understanding.    

## 2016-05-14 DIAGNOSIS — R4189 Other symptoms and signs involving cognitive functions and awareness: Secondary | ICD-10-CM | POA: Insufficient documentation

## 2016-05-14 DIAGNOSIS — R519 Headache, unspecified: Secondary | ICD-10-CM | POA: Insufficient documentation

## 2016-05-14 DIAGNOSIS — R51 Headache: Secondary | ICD-10-CM

## 2016-05-28 ENCOUNTER — Ambulatory Visit
Admission: RE | Admit: 2016-05-28 | Discharge: 2016-05-28 | Disposition: A | Discharge status: Home / Self Care | Payer: Medicare Other | Source: Ambulatory Visit | Attending: Oncology | Admitting: Oncology

## 2016-05-28 DIAGNOSIS — M8588 Other specified disorders of bone density and structure, other site: Secondary | ICD-10-CM | POA: Diagnosis not present

## 2016-05-28 DIAGNOSIS — Z170 Estrogen receptor positive status [ER+]: Secondary | ICD-10-CM | POA: Diagnosis present

## 2016-05-28 DIAGNOSIS — C50212 Malignant neoplasm of upper-inner quadrant of left female breast: Secondary | ICD-10-CM

## 2016-05-28 DIAGNOSIS — Z780 Asymptomatic menopausal state: Secondary | ICD-10-CM

## 2016-06-06 ENCOUNTER — Telehealth: Payer: Self-pay

## 2016-06-06 NOTE — Telephone Encounter (Signed)
Patient has osteopenia. Please insure she is taking calcium and vitamin D supplementation.

## 2016-06-06 NOTE — Telephone Encounter (Signed)
Patient calling to get results of Bone Density

## 2016-06-07 NOTE — Telephone Encounter (Signed)
Advised patient of results below. Answered all questions and she will go get a calcium with a vitamin d supplement.

## 2016-06-21 ENCOUNTER — Other Ambulatory Visit
Admission: RE | Admit: 2016-06-21 | Discharge: 2016-06-21 | Disposition: A | Discharge status: Home / Self Care | Payer: Medicare Other | Source: Other Acute Inpatient Hospital | Attending: Oncology | Admitting: Oncology

## 2016-06-21 LAB — PROTIME-INR
INR: 4.27
Prothrombin Time: 42.2 seconds — ABNORMAL HIGH (ref 11.4–15.2)

## 2016-08-12 NOTE — Progress Notes (Signed)
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  INTERVAL HISTORY: Patient returns to clinic today for routine 3 month evaluation. She continues to decline to initiate letrozole. She currently feels well and is at her baseline. She continues to receive dialysis on Mondays, Wednesdays, and Fridays. She has no neurologic complaints. She denies any recent fevers. She has a good appetite and denies weight loss. She has no chest pain or shortness of breath. She denies any nausea, vomiting, constipation, or diarrhea. Patient offers no further specific complaints.  REVIEW OF SYSTEMS:   Review of Systems  Constitutional: Negative for fever, malaise/fatigue and weight loss.  Respiratory: Negative.  Negative for cough and shortness of breath.   Cardiovascular: Negative.  Negative for chest pain and leg swelling.  Gastrointestinal: Negative.  Negative for abdominal pain.  Genitourinary: Negative.   Musculoskeletal: Negative.   Skin: Negative.  Negative for rash.  Neurological: Negative.  Negative for sensory change and weakness.  Psychiatric/Behavioral: The patient is not nervous/anxious.     As per HPI. Otherwise, a complete review of systems is negative.  PAST MEDICAL HISTORY: Past Medical History:  Diagnosis Date  . Anemia   . Anxiety   . Breast cancer ([place]) 01/2016   bilateral  . CHF (congestive heart failure) ([place])   . Chronic kidney disease   . Depression   . Dialysis patient ([place])   . DVT (deep venous thrombosis) ([facility])    left leg  . DVT  (deep venous thrombosis) ([place]) 1985   right thigh  . Dysrhythmia   . Gout   . Headache   . HTN (hypertension)   . Hypertension   . Parathyroid abnormality ([place])   . Parathyroid disease ([place])   . Pneumonia 12/2015  . Psoriasis   . Renal insufficiency   . Sickle cell trait ([place])    traits    PAST SURGICAL HISTORY: Past Surgical History:  Procedure Laterality Date  . ABDOMINAL HYSTERECTOMY  1980  . APPENDECTOMY    . BREAST BIOPSY Left 10/28/2013   benign  . BREAST EXCISIONAL BIOPSY Left 2002   benign  . INSERTION OF DIALYSIS CATHETER  2014  . LIPOMA EXCISION N/A 01/23/2016   Procedure: EXCISION LIPOMA;  Surgeon: Hubbard Robinson, MD;  Location: [facility];  Service: General;  Laterality: N/A;  . MASTECTOMY W/ SENTINEL NODE BIOPSY Bilateral 01/23/2016   Procedure: bilateral MASTECTOMY WITH  bilateral SENTINEL LYMPH NODE BIOPSY possible left axillary node dissection forehead lipoma removal;  Surgeon: Hubbard Robinson, MD;  Location: [facility];  Service: General;  Laterality: Bilateral;  . PARTIAL HYSTERECTOMY    . PERIPHERAL VASCULAR CATHETERIZATION N/A 12/25/2015   Procedure: Dialysis/Perma Catheter Insertion;  Surgeon: Algernon Huxley, MD;  Location: [facility];  Service: Cardiovascular;  Laterality: N/A;  . PERIPHERAL VASCULAR CATHETERIZATION Left 01/22/2016   Procedure: Dialysis/Perma Catheter Insertion;  Surgeon: Algernon Huxley, MD;  Location: [facility];  Service: Cardiovascular;  Laterality: Left;  . PERIPHERAL VASCULAR CATHETERIZATION N/A 01/26/2016   Procedure: Dialysis/Perma Catheter Insertion;  Surgeon: Katha Cabal, MD;  Location: [facility];  Service: Cardiovascular;  Laterality: N/A;  . PORT-A-CATH REMOVAL N/A 12/20/2015   Procedure: REMOVAL PORT-A-CATH;  Surgeon: Hubbard Robinson, MD;  Location: [facility];  Service: General;  Laterality: N/A;  left    . PORTACATH PLACEMENT Left 08/21/2015   Procedure: INSERTION PORT-A-CATH;  Surgeon:  Hubbard Robinson, MD;  Location: [facility];  Service: General;  Laterality: Left;  . REMOVAL OF A DIALYSIS CATHETER  2017    FAMILY HISTORY: Family History  Problem Relation Age of Onset  . Stroke Mother   . CVA Mother   . Hypertension Mother   . Hypertension Father   . Hypertension Sister   . Diabetes Sister   . Cancer Sister 66       Breast  . Stroke Brother   . Hypertension Brother   . Breast cancer Maternal Aunt 59       ADVANCED DIRECTIVES:    HEALTH MAINTENANCE: Social History  Substance Use Topics  . Smoking status: Never Smoker  . Smokeless tobacco: Never Used  . Alcohol use No     Colonoscopy:  PAP:  Bone density:  Lipid panel:  Allergies  Allergen Reactions  . Gabapentin Other (See Comments)    Seizure  . Adhesive [Tape] Itching    Silk tape is ok to use.    Current Outpatient Prescriptions  Medication Sig Dispense Refill  . acetaminophen (TYLENOL) 325 MG tablet Take 2 tablets (650 mg total) by mouth every 6 (six) hours as needed for mild pain (or Fever >/= 101).    Marland Kitchen ALPRAZolam (XANAX) 0.5 MG tablet Take 1 tablet (0.5 mg total) by mouth 2 (two) times daily as needed for anxiety. 20 tablet 0  . amLODipine (NORVASC) 10 MG tablet Take 1 tablet (10 mg total) by mouth daily before supper. Take after checking blood pressure on Dialysis days, avoid taking it- if systolic BP < 001. 30 tablet 0  . feeding supplement, ENSURE ENLIVE, (ENSURE ENLIVE) LIQD Take 237 mLs by mouth 3 (three) times daily between meals. (Patient taking differently: Take 237 mLs by mouth daily. ) 237 mL 12  . letrozole (FEMARA) 2.5 MG tablet Take 1 tablet (2.5 mg total) by mouth daily. 30 tablet 3  . losartan (COZAAR) 50 MG tablet Take 50-100 mg by mouth daily. Takes 100 MG DAILY on non-dialysis days=monday, Wednesday and Friday    . metoprolol (LOPRESSOR) 100 MG tablet Take 0.5 tablets (50 mg total) by mouth 2 (two) times daily. Takes on non-dialysis days=Tuesday, Thursday, Saturday and  Sunday. (Patient taking differently: Take 100 mg by mouth 2 (two) times daily. Takes 100 MG DAILY on non-dialysis days= Monday, Wednesday, Friday) 60 tablet 0  . pantoprazole (PROTONIX) 40 MG tablet Take 1 tablet (40 mg total) by mouth daily. 30 tablet 0  . sertraline (ZOLOFT) 50 MG tablet Take 3 tablets (150 mg total) by mouth daily. (Patient taking differently: Take 200 mg by mouth daily. ) 90 tablet 0  . sevelamer carbonate (RENVELA) 800 MG tablet Take 800 mg by mouth 4 (four) times daily. Tid with meals and with 1 snack    . traMADol (ULTRAM) 50 MG tablet Take 1 tablet (50 mg total) by mouth every 6 (six) hours as needed for moderate pain. 10 tablet 0  . traZODone (DESYREL) 50 MG tablet Take 1 tablet by mouth 1 day or 1 dose.    Marland Kitchen UNABLE TO FIND Med Name: Prosthetic Bra with Inserts 2 each 0  . warfarin (COUMADIN) 5 MG tablet tk 1  tab on sunday, friday, saturday and 1.5 tabs on the other days    . zolpidem (AMBIEN) 5 MG tablet Take 1 tablet (5 mg total) by mouth at bedtime. (Patient taking differently: Take 10 mg by mouth at bedtime. ) 30 tablet 0   No current facility-administered medications for this visit.     OBJECTIVE: Vitals:   08/13/16 1417  BP: (!) 170/117  Pulse: 69  Resp: 18  Temp: 97.9 F (36.6 C)     Body mass index is 20.51 kg/m.    ECOG FS:0 - Asymptomatic  General: Well-developed, well-nourished, no acute distress. Eyes: Pink conjunctiva, anicteric sclera. Breasts: Bilateral mastectomy. Lungs: Clear to auscultation bilaterally. Chest wall: Dialysis catheter in right chest wall, port noted in left chest wall. Heart: Regular rate and rhythm. No rubs, murmurs, or gallops. Abdomen: Soft, nontender, nondistended. No organomegaly noted, normoactive bowel sounds. Musculoskeletal: No edema, cyanosis, or clubbing. Neuro: Alert, answering all questions appropriately. Cranial nerves grossly intact. Skin: No rashes or petechiae noted. Psych: Normal affect.   LAB  RESULTS:  Lab Results  Component Value Date   NA 134 (L) 03/06/2016   K 3.4 (L) 03/06/2016   CL 99 (L) 03/06/2016   CO2 24 03/06/2016   GLUCOSE 99 03/06/2016   BUN 30 (H) 03/06/2016   CREATININE 6.50 (H) 03/06/2016   CALCIUM 9.6 03/06/2016   PROT 6.8 03/06/2016   ALBUMIN 3.7 03/06/2016   AST 21 03/06/2016   ALT 18 03/06/2016   ALKPHOS 224 (H) 03/06/2016   BILITOT 0.4 03/06/2016   GFRNONAA 6 (L) 03/06/2016   GFRAA 7 (L) 03/06/2016    Lab Results  Component Value Date   WBC 5.3 03/06/2016   NEUTROABS 3.2 03/06/2016   HGB 9.6 (L) 03/06/2016   HCT 27.9 (L) 03/06/2016   MCV 84.9 03/06/2016   PLT 179 03/06/2016     STUDIES: No results found.  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  PLAN:    1. 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 Her surgery was delayed secondary to Multiple hospital admissions, but she ultimately decided to proceed with a double mastectomy, which was done on January 23, 2016. Because of this, she did not require XRT. Patient continues to decline to initiate letrozole. She will return to clinic in 6 months for further evaluation. If she continues to refuse letrozole, patient will likely be discharged from clinic at that time.    2. End-stage renal disease: Continue dialysis on Monday, Wednesday, and Fridays. 3. Anemia: Likely multifactorial. Monitor. 4. Osteopenia: Patient's bone mineral density on May 28, 2016 revealed a T score of -2.0. Have recommended patient take calcium and vitamin D supplementation.  5. Hypertension: Improved, monitor.   Approximately 30 minutes was spent in discussion of which greater than 50% was consultation.   Patient expressed understanding and was in agreement with this  plan. She also understands that She can call clinic at any time with any questions, concerns, or complaints.   Cancer Staging Malignant neoplasm of upper-inner quadrant of left breast in female, estrogen receptor positive ([place]) Staging form: Breast, AJCC 7th Edition - Pathologic stage from 02/05/2016: Stage IIA (T1b, N1a, cM0) - Signed by [name], MD on 02/05/2016   [name], MD   08/16/2016 3:49 PM

## 2016-08-13 ENCOUNTER — Inpatient Hospital Stay: Payer: Medicare Other | Attending: Oncology | Admitting: Oncology

## 2016-08-13 ENCOUNTER — Encounter: Payer: Self-pay | Admitting: Nurse Practitioner

## 2016-08-13 VITALS — BP 170/117 | HR 69 | Temp 97.9°F | Resp 18 | Wt 127.1 lb

## 2016-08-13 DIAGNOSIS — D649 Anemia, unspecified: Secondary | ICD-10-CM

## 2016-08-13 DIAGNOSIS — Z9221 Personal history of antineoplastic chemotherapy: Secondary | ICD-10-CM | POA: Insufficient documentation

## 2016-08-13 DIAGNOSIS — M109 Gout, unspecified: Secondary | ICD-10-CM | POA: Insufficient documentation

## 2016-08-13 DIAGNOSIS — F419 Anxiety disorder, unspecified: Secondary | ICD-10-CM | POA: Insufficient documentation

## 2016-08-13 DIAGNOSIS — I132 Hypertensive heart and chronic kidney disease with heart failure and with stage 5 chronic kidney disease, or end stage renal disease: Secondary | ICD-10-CM | POA: Diagnosis not present

## 2016-08-13 DIAGNOSIS — N186 End stage renal disease: Secondary | ICD-10-CM | POA: Diagnosis not present

## 2016-08-13 DIAGNOSIS — Z992 Dependence on renal dialysis: Secondary | ICD-10-CM | POA: Diagnosis not present

## 2016-08-13 DIAGNOSIS — L409 Psoriasis, unspecified: Secondary | ICD-10-CM | POA: Insufficient documentation

## 2016-08-13 DIAGNOSIS — F329 Major depressive disorder, single episode, unspecified: Secondary | ICD-10-CM | POA: Insufficient documentation

## 2016-08-13 DIAGNOSIS — C50212 Malignant neoplasm of upper-inner quadrant of left female breast: Secondary | ICD-10-CM

## 2016-08-13 DIAGNOSIS — D573 Sickle-cell trait: Secondary | ICD-10-CM | POA: Insufficient documentation

## 2016-08-13 DIAGNOSIS — M8580 Other specified disorders of bone density and structure, unspecified site: Secondary | ICD-10-CM | POA: Insufficient documentation

## 2016-08-13 DIAGNOSIS — Z170 Estrogen receptor positive status [ER+]: Secondary | ICD-10-CM | POA: Insufficient documentation

## 2016-08-13 DIAGNOSIS — Z9013 Acquired absence of bilateral breasts and nipples: Secondary | ICD-10-CM | POA: Diagnosis not present

## 2016-08-21 ENCOUNTER — Encounter: Payer: Self-pay | Admitting: Intensive Care

## 2016-08-21 ENCOUNTER — Inpatient Hospital Stay
Admission: EM | Admit: 2016-08-21 | Discharge: 2016-08-23 | DRG: 189 | Disposition: A | Discharge status: Home / Self Care | Payer: Medicare Other | Attending: Specialist | Admitting: Specialist

## 2016-08-21 ENCOUNTER — Emergency Department: Payer: Medicare Other

## 2016-08-21 DIAGNOSIS — E215 Disorder of parathyroid gland, unspecified: Secondary | ICD-10-CM | POA: Diagnosis present

## 2016-08-21 DIAGNOSIS — Z90710 Acquired absence of both cervix and uterus: Secondary | ICD-10-CM | POA: Diagnosis not present

## 2016-08-21 DIAGNOSIS — J810 Acute pulmonary edema: Secondary | ICD-10-CM | POA: Diagnosis not present

## 2016-08-21 DIAGNOSIS — G40909 Epilepsy, unspecified, not intractable, without status epilepticus: Secondary | ICD-10-CM | POA: Diagnosis present

## 2016-08-21 DIAGNOSIS — J9601 Acute respiratory failure with hypoxia: Secondary | ICD-10-CM | POA: Diagnosis present

## 2016-08-21 DIAGNOSIS — J811 Chronic pulmonary edema: Secondary | ICD-10-CM | POA: Diagnosis present

## 2016-08-21 DIAGNOSIS — Z8249 Family history of ischemic heart disease and other diseases of the circulatory system: Secondary | ICD-10-CM | POA: Diagnosis not present

## 2016-08-21 DIAGNOSIS — F419 Anxiety disorder, unspecified: Secondary | ICD-10-CM | POA: Diagnosis present

## 2016-08-21 DIAGNOSIS — Z86718 Personal history of other venous thrombosis and embolism: Secondary | ICD-10-CM | POA: Diagnosis not present

## 2016-08-21 DIAGNOSIS — N186 End stage renal disease: Secondary | ICD-10-CM | POA: Diagnosis present

## 2016-08-21 DIAGNOSIS — Z833 Family history of diabetes mellitus: Secondary | ICD-10-CM | POA: Diagnosis not present

## 2016-08-21 DIAGNOSIS — Z79811 Long term (current) use of aromatase inhibitors: Secondary | ICD-10-CM | POA: Diagnosis not present

## 2016-08-21 DIAGNOSIS — Z992 Dependence on renal dialysis: Secondary | ICD-10-CM | POA: Diagnosis not present

## 2016-08-21 DIAGNOSIS — I132 Hypertensive heart and chronic kidney disease with heart failure and with stage 5 chronic kidney disease, or end stage renal disease: Secondary | ICD-10-CM | POA: Diagnosis present

## 2016-08-21 DIAGNOSIS — N2581 Secondary hyperparathyroidism of renal origin: Secondary | ICD-10-CM | POA: Diagnosis present

## 2016-08-21 DIAGNOSIS — D573 Sickle-cell trait: Secondary | ICD-10-CM | POA: Diagnosis present

## 2016-08-21 DIAGNOSIS — Z7901 Long term (current) use of anticoagulants: Secondary | ICD-10-CM

## 2016-08-21 DIAGNOSIS — Z9010 Acquired absence of unspecified breast and nipple: Secondary | ICD-10-CM | POA: Diagnosis not present

## 2016-08-21 DIAGNOSIS — F329 Major depressive disorder, single episode, unspecified: Secondary | ICD-10-CM | POA: Diagnosis present

## 2016-08-21 DIAGNOSIS — I5022 Chronic systolic (congestive) heart failure: Secondary | ICD-10-CM | POA: Diagnosis present

## 2016-08-21 DIAGNOSIS — Z803 Family history of malignant neoplasm of breast: Secondary | ICD-10-CM

## 2016-08-21 DIAGNOSIS — I429 Cardiomyopathy, unspecified: Secondary | ICD-10-CM | POA: Diagnosis present

## 2016-08-21 DIAGNOSIS — J189 Pneumonia, unspecified organism: Secondary | ICD-10-CM

## 2016-08-21 DIAGNOSIS — Z823 Family history of stroke: Secondary | ICD-10-CM

## 2016-08-21 DIAGNOSIS — I482 Chronic atrial fibrillation: Secondary | ICD-10-CM | POA: Diagnosis present

## 2016-08-21 DIAGNOSIS — M109 Gout, unspecified: Secondary | ICD-10-CM | POA: Diagnosis present

## 2016-08-21 DIAGNOSIS — R000 Tachycardia, unspecified: Secondary | ICD-10-CM | POA: Diagnosis present

## 2016-08-21 DIAGNOSIS — I071 Rheumatic tricuspid insufficiency: Secondary | ICD-10-CM | POA: Diagnosis present

## 2016-08-21 DIAGNOSIS — Z853 Personal history of malignant neoplasm of breast: Secondary | ICD-10-CM

## 2016-08-21 DIAGNOSIS — D631 Anemia in chronic kidney disease: Secondary | ICD-10-CM | POA: Diagnosis present

## 2016-08-21 DIAGNOSIS — Z91048 Other nonmedicinal substance allergy status: Secondary | ICD-10-CM

## 2016-08-21 DIAGNOSIS — J9621 Acute and chronic respiratory failure with hypoxia: Principal | ICD-10-CM | POA: Diagnosis present

## 2016-08-21 DIAGNOSIS — Z79899 Other long term (current) drug therapy: Secondary | ICD-10-CM

## 2016-08-21 DIAGNOSIS — R0603 Acute respiratory distress: Secondary | ICD-10-CM

## 2016-08-21 DIAGNOSIS — I160 Hypertensive urgency: Secondary | ICD-10-CM | POA: Diagnosis present

## 2016-08-21 DIAGNOSIS — Z888 Allergy status to other drugs, medicaments and biological substances status: Secondary | ICD-10-CM

## 2016-08-21 DIAGNOSIS — L409 Psoriasis, unspecified: Secondary | ICD-10-CM | POA: Diagnosis present

## 2016-08-21 LAB — BASIC METABOLIC PANEL
Anion gap: 15 (ref 5–15)
BUN: 63 mg/dL — AB (ref 6–20)
CO2: 20 mmol/L — AB (ref 22–32)
Calcium: 9.9 mg/dL (ref 8.9–10.3)
Chloride: 104 mmol/L (ref 101–111)
Creatinine, Ser: 9.42 mg/dL — ABNORMAL HIGH (ref 0.44–1.00)
GFR calc Af Amer: 5 mL/min — ABNORMAL LOW (ref >60)
GFR calc non Af Amer: 4 mL/min — ABNORMAL LOW (ref >60)
GLUCOSE: 135 mg/dL — AB (ref 65–99)
Potassium: 4.9 mmol/L (ref 3.5–5.1)
Sodium: 139 mmol/L (ref 135–145)

## 2016-08-21 LAB — CBC WITH DIFFERENTIAL/PLATELET
Basophils Absolute: 0.1 10*3/uL (ref 0–0.1)
Basophils Relative: 1 %
Eosinophils Absolute: 0.3 10*3/uL (ref 0–0.7)
Eosinophils Relative: 2 %
HEMATOCRIT: 42.8 % (ref 35.0–47.0)
HEMOGLOBIN: 13.9 g/dL (ref 12.0–16.0)
Lymphocytes Relative: 32 %
Lymphs Abs: 4.5 10*3/uL — ABNORMAL HIGH (ref 1.0–3.6)
MCH: 28.7 pg (ref 26.0–34.0)
MCHC: 32.5 g/dL (ref 32.0–36.0)
MCV: 88.5 fL (ref 80.0–100.0)
Monocytes Absolute: 0.7 10*3/uL (ref 0.2–0.9)
Monocytes Relative: 5 %
Neutro Abs: 8.3 10*3/uL — ABNORMAL HIGH (ref 1.4–6.5)
Neutrophils Relative %: 60 %
Platelets: 251 10*3/uL (ref 150–440)
RBC: 4.83 MIL/uL (ref 3.80–5.20)
RDW: 16.3 % — ABNORMAL HIGH (ref 11.5–14.5)
WBC: 14 10*3/uL — ABNORMAL HIGH (ref 3.6–11.0)

## 2016-08-21 LAB — BLOOD GAS, VENOUS
PATIENT TEMPERATURE: 37
PO2 VEN: 33 mmHg (ref 32.0–45.0)
pCO2, Ven: 52 mmHg (ref 44.0–60.0)
pH, Ven: 7.24 — ABNORMAL LOW (ref 7.250–7.430)

## 2016-08-21 LAB — TROPONIN I: Troponin I: 0.04 ng/mL (ref <0.03)

## 2016-08-21 LAB — PROTIME-INR
INR: 2.38
Prothrombin Time: 26.4 seconds — ABNORMAL HIGH (ref 11.4–15.2)

## 2016-08-21 LAB — MRSA PCR SCREENING: MRSA BY PCR: NEGATIVE

## 2016-08-21 LAB — GLUCOSE, CAPILLARY: GLUCOSE-CAPILLARY: 98 mg/dL (ref 65–99)

## 2016-08-21 LAB — LACTIC ACID, PLASMA
Lactic Acid, Venous: 1.4 mmol/L (ref 0.5–1.9)
Lactic Acid, Venous: 1.3 mmol/L (ref 0.5–1.9)

## 2016-08-21 LAB — BRAIN NATRIURETIC PEPTIDE: B Natriuretic Peptide: 2034 pg/mL — ABNORMAL HIGH (ref 0.0–100.0)

## 2016-08-21 MED ORDER — NITROGLYCERIN IN D5W 200-5 MCG/ML-% IV SOLN
0.0000 ug/min | Freq: Once | INTRAVENOUS | Status: AC
  Administered 2016-08-21: 5 ug/min via INTRAVENOUS

## 2016-08-21 MED ORDER — PANTOPRAZOLE SODIUM 40 MG PO TBEC
40.0000 mg | DELAYED_RELEASE_TABLET | Freq: Two times a day (BID) | ORAL | Status: DC
  Administered 2016-08-21 – 2016-08-23 (×4): 40 mg via ORAL
  Filled 2016-08-21 (×5): qty 1

## 2016-08-21 MED ORDER — METOPROLOL TARTRATE 50 MG PO TABS
100.0000 mg | ORAL_TABLET | Freq: Every day | ORAL | Status: DC
  Administered 2016-08-22 – 2016-08-23 (×2): 100 mg via ORAL
  Filled 2016-08-21 (×3): qty 2

## 2016-08-21 MED ORDER — SEVELAMER CARBONATE 800 MG PO TABS
2400.0000 mg | ORAL_TABLET | Freq: Three times a day (TID) | ORAL | Status: DC
  Administered 2016-08-21 – 2016-08-23 (×6): 2400 mg via ORAL
  Filled 2016-08-21 (×6): qty 3

## 2016-08-21 MED ORDER — ACETAMINOPHEN 325 MG PO TABS
650.0000 mg | ORAL_TABLET | Freq: Once | ORAL | Status: AC
  Administered 2016-08-21: 650 mg via ORAL
  Filled 2016-08-21: qty 2

## 2016-08-21 MED ORDER — METOPROLOL TARTRATE 50 MG PO TABS
100.0000 mg | ORAL_TABLET | ORAL | Status: DC
  Administered 2016-08-22: 100 mg via ORAL
  Filled 2016-08-21: qty 2

## 2016-08-21 MED ORDER — NITROGLYCERIN IN D5W 200-5 MCG/ML-% IV SOLN
INTRAVENOUS | Status: AC
  Administered 2016-08-21: 5 ug/min via INTRAVENOUS
  Filled 2016-08-21: qty 250

## 2016-08-21 MED ORDER — METOPROLOL TARTRATE 5 MG/5ML IV SOLN
INTRAVENOUS | Status: AC
  Filled 2016-08-21: qty 5

## 2016-08-21 MED ORDER — TRAMADOL HCL 50 MG PO TABS
50.0000 mg | ORAL_TABLET | Freq: Four times a day (QID) | ORAL | Status: DC | PRN
  Administered 2016-08-23: 50 mg via ORAL
  Filled 2016-08-21: qty 1

## 2016-08-21 MED ORDER — WARFARIN SODIUM 5 MG PO TABS
5.0000 mg | ORAL_TABLET | ORAL | Status: DC
  Filled 2016-08-21: qty 1

## 2016-08-21 MED ORDER — SERTRALINE HCL 50 MG PO TABS
200.0000 mg | ORAL_TABLET | Freq: Every day | ORAL | Status: DC
  Administered 2016-08-22 – 2016-08-23 (×2): 200 mg via ORAL
  Filled 2016-08-21 (×3): qty 4

## 2016-08-21 MED ORDER — WARFARIN - PHARMACIST DOSING INPATIENT
Freq: Every day | Status: DC
Start: 2016-08-21 — End: 2016-08-23
  Administered 2016-08-21 – 2016-08-22 (×2)

## 2016-08-21 MED ORDER — ACETAMINOPHEN 650 MG RE SUPP: 650.0000 mg | Freq: Four times a day (QID) | RECTAL | Status: DC | PRN

## 2016-08-21 MED ORDER — ZOLPIDEM TARTRATE 5 MG PO TABS
5.0000 mg | ORAL_TABLET | Freq: Every day | ORAL | Status: DC
  Administered 2016-08-21 – 2016-08-22 (×2): 5 mg via ORAL
  Filled 2016-08-21 (×2): qty 1

## 2016-08-21 MED ORDER — LEVOFLOXACIN IN D5W 750 MG/150ML IV SOLN
750.0000 mg | Freq: Once | INTRAVENOUS | Status: DC
  Filled 2016-08-21: qty 150

## 2016-08-21 MED ORDER — MORPHINE SULFATE (PF) 2 MG/ML IV SOLN
2.0000 mg | Freq: Once | INTRAVENOUS | Status: AC
  Administered 2016-08-21: 2 mg via INTRAVENOUS
  Filled 2016-08-21: qty 1

## 2016-08-21 MED ORDER — MORPHINE SULFATE (PF) 2 MG/ML IV SOLN
1.0000 mg | INTRAVENOUS | Status: DC | PRN
  Administered 2016-08-21 (×2): 2 mg via INTRAVENOUS
  Filled 2016-08-21 (×2): qty 1

## 2016-08-21 MED ORDER — ONDANSETRON HCL 4 MG/2ML IJ SOLN: 4.0000 mg | Freq: Four times a day (QID) | INTRAMUSCULAR | Status: DC | PRN

## 2016-08-21 MED ORDER — DIPHENHYDRAMINE HCL 50 MG/ML IJ SOLN
25.0000 mg | Freq: Once | INTRAMUSCULAR | Status: AC
  Administered 2016-08-21: 25 mg via INTRAVENOUS
  Filled 2016-08-21: qty 1

## 2016-08-21 MED ORDER — LETROZOLE 2.5 MG PO TABS
2.5000 mg | ORAL_TABLET | Freq: Every day | ORAL | Status: DC
  Filled 2016-08-21 (×2): qty 1

## 2016-08-21 MED ORDER — LABETALOL HCL 5 MG/ML IV SOLN: 10.0000 mg | INTRAVENOUS | Status: DC | PRN

## 2016-08-21 MED ORDER — HYDRALAZINE HCL 20 MG/ML IJ SOLN: 10.0000 mg | Freq: Four times a day (QID) | INTRAMUSCULAR | Status: DC | PRN

## 2016-08-21 MED ORDER — ACETAMINOPHEN 325 MG PO TABS
650.0000 mg | ORAL_TABLET | Freq: Four times a day (QID) | ORAL | Status: DC | PRN
  Administered 2016-08-21: 650 mg via ORAL
  Filled 2016-08-21: qty 2

## 2016-08-21 MED ORDER — FUROSEMIDE 10 MG/ML IJ SOLN
120.0000 mg | Freq: Once | INTRAVENOUS | Status: AC
  Administered 2016-08-21: 120 mg via INTRAVENOUS
  Filled 2016-08-21: qty 12

## 2016-08-21 MED ORDER — KETOROLAC TROMETHAMINE 30 MG/ML IJ SOLN
30.0000 mg | Freq: Once | INTRAMUSCULAR | Status: AC
  Administered 2016-08-21: 30 mg via INTRAVENOUS
  Filled 2016-08-21: qty 1

## 2016-08-21 MED ORDER — WARFARIN SODIUM 1 MG PO TABS
1.5000 mg | ORAL_TABLET | ORAL | Status: DC
  Administered 2016-08-21 – 2016-08-22 (×2): 1.5 mg via ORAL
  Filled 2016-08-21 (×2): qty 1

## 2016-08-21 MED ORDER — LOSARTAN POTASSIUM 50 MG PO TABS
100.0000 mg | ORAL_TABLET | Freq: Two times a day (BID) | ORAL | Status: DC
  Administered 2016-08-21 – 2016-08-23 (×4): 100 mg via ORAL
  Filled 2016-08-21 (×5): qty 2

## 2016-08-21 MED ORDER — ONDANSETRON HCL 4 MG PO TABS: 4.0000 mg | ORAL_TABLET | Freq: Four times a day (QID) | ORAL | Status: DC | PRN

## 2016-08-21 MED ORDER — HYDRALAZINE HCL 20 MG/ML IJ SOLN
20.0000 mg | Freq: Once | INTRAMUSCULAR | Status: AC
  Administered 2016-08-21: 20 mg via INTRAVENOUS
  Filled 2016-08-21: qty 1

## 2016-08-21 MED ORDER — METOPROLOL TARTRATE 5 MG/5ML IV SOLN
5.0000 mg | Freq: Once | INTRAVENOUS | Status: AC
  Administered 2016-08-21: 5 mg via INTRAVENOUS

## 2016-08-21 NOTE — ED Provider Notes (Signed)
[facility] Emergency Department Provider Note       Time seen: ----------------------------------------- 8:44 AM on 08/21/2016 -----------------------------------------   Level V caveat: History/ROS limited by respiratory distress  I have reviewed the triage vital signs and the nursing notes.   HISTORY   Chief Complaint Respiratory Distress    HPI Teresa Cook is a 61 y.o. female who presents to the ED for restaurant distress. Room air saturations were in the 80 percentile. Patient arrived on CPAP with oxygen around 92%. She is a dialysis patient and last had dialysis on Wednesday. EMS administered 1 inch nitroglycerin as well as IV Solu-Medrol. She aroused with severely elevated blood pressure. Patient reports this happen before and the cause was pneumonia   Past Medical History:  Diagnosis Date  . Anemia   . Anxiety   . Atrial fibrillation ([place])   . Breast cancer ([place]) 01/2016   bilateral  . CHF (congestive heart failure) ([place])   . Chronic kidney disease   . Depression   . Dialysis patient ([place])   . DVT (deep venous thrombosis) (HCC)    left leg  . DVT (deep venous thrombosis) ([place]) 1985   right thigh  . Dysrhythmia   . Gout   . Headache   . HTN (hypertension)   . Hypertension   . Parathyroid abnormality ([place])   . Parathyroid disease ([place])   . Pneumonia 12/2015  . Psoriasis   . Renal insufficiency   . Sickle cell trait ([place])    traits    Patient Active Problem List   Diagnosis Date Noted  . HTN (hypertension) 02/29/2016  . Moderate recurrent major depression ([place]) 02/29/2016  . Panic attacks 02/29/2016  . Scalp lesion   . Breast cancer ([place]) 01/23/2016  . ESRD (end stage renal disease) ([place]) 01/22/2016  . Severe anemia 01/01/2016  . Seizure ([place]) 12/31/2015  . Healthcare-associated pneumonia   . Bloodstream infection due to port-a-cath, initial encounter   . Sepsis ([place]) 12/15/2015  . Chest pain 08/31/2015  . SIRS  (systemic inflammatory response syndrome) ([place]) 08/27/2015  . Malignant neoplasm of upper-inner quadrant of left breast in female, estrogen receptor positive ([place]) 08/05/2015  . Pulmonary edema 07/09/2015  . Dizziness 03/03/2015  . Seizures ([place]) 03/03/2015  . Spells 11/11/2014  . Acute respiratory failure with hypoxia ([place]) 09/19/2014  . Left ventricular dysfunction 09/06/2014  . Acute pulmonary edema ([place]) 08/30/2014  . Respiratory failure ([place]) 08/29/2014  . Respiratory difficulty 08/29/2014  . Chronic systolic heart failure ([place]) 05/16/2014  . Hypertension 05/16/2014  . Renal failure 05/16/2014  . Anxiety 05/16/2014  . Sickle cell trait ([place]) 03/03/2014  . ESRD on dialysis ([place]) 11/02/2013  . Dysgeusia 01/27/2013  . Weight loss 01/27/2013  . Resting tremor 08/13/2012  . Depression 04/30/2012  . 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 Right leg DVT ([place]) 04/30/2012    Past Surgical History:  Procedure Laterality Date  . ABDOMINAL HYSTERECTOMY  1980  . APPENDECTOMY    . BREAST BIOPSY Left 10/28/2013   benign  . BREAST EXCISIONAL BIOPSY Left 2002   benign  . INSERTION OF DIALYSIS CATHETER  2014  . LIPOMA EXCISION N/A 01/23/2016   Procedure: EXCISION LIPOMA;  Surgeon: Hubbard Robinson, MD;  Location: [facility] ORS;  Service: General;  Laterality: N/A;  . MASTECTOMY W/ SENTINEL NODE BIOPSY Bilateral 01/23/2016   Procedure: bilateral MASTECTOMY WITH  bilateral SENTINEL LYMPH NODE BIOPSY possible left axillary node dissection forehead lipoma removal;  Surgeon: Hubbard Robinson,  MD;  Location: [facility] ORS;  Service: General;  Laterality: Bilateral;  . PARTIAL HYSTERECTOMY    . PERIPHERAL VASCULAR CATHETERIZATION N/A 12/25/2015   Procedure: Dialysis/Perma Catheter Insertion;  Surgeon: Algernon Huxley, MD;  Location: [place] [facility];  Service: Cardiovascular;  Laterality: N/A;  . PERIPHERAL VASCULAR CATHETERIZATION Left 01/22/2016   Procedure: Dialysis/Perma Catheter  Insertion;  Surgeon: Algernon Huxley, MD;  Location: [place] [facility];  Service: Cardiovascular;  Laterality: Left;  . PERIPHERAL VASCULAR CATHETERIZATION N/A 01/26/2016   Procedure: Dialysis/Perma Catheter Insertion;  Surgeon: Katha Cabal, MD;  Location: [place] [facility];  Service: Cardiovascular;  Laterality: N/A;  . PORT-A-CATH REMOVAL N/A 12/20/2015   Procedure: REMOVAL PORT-A-CATH;  Surgeon: Hubbard Robinson, MD;  Location: [facility] ORS;  Service: General;  Laterality: N/A;  left    . PORTACATH PLACEMENT Left 08/21/2015   Procedure: INSERTION PORT-A-CATH;  Surgeon: Hubbard Robinson, MD;  Location: [facility] ORS;  Service: General;  Laterality: Left;  . REMOVAL OF A DIALYSIS CATHETER  2017    Allergies Gabapentin and Adhesive [tape]  Social History Social History  Substance Use Topics  . Smoking status: Never Smoker  . Smokeless tobacco: Never Used  . Alcohol use No    Review of Systems Respiratory: positive for shortness of breath  All systems negative/normal/unremarkable except as stated in the HPI  ____________________________________________   PHYSICAL EXAM:  VITAL SIGNS: ED Triage Vitals  Enc Vitals Group     BP 08/21/16 0817 (!) 220/152     Pulse Rate 08/21/16 0817 (!) 122     Resp 08/21/16 0826 (!) 38     Temp 08/21/16 0826 97.7 F (36.5 C)     Temp Source 08/21/16 0826 Oral     SpO2 08/21/16 0817 (!) 88 %     Weight 08/21/16 0816 127 lb (57.6 kg)     Height --      Head Circumference --      Peak Flow --      Pain Score --      Pain Loc --      Pain Edu? --      Excl. in [place]? --     Constitutional: Alert and oriented. oderate distress Eyes: Conjunctivae are normal. Normal extraocular movements. ENT   Head: Normocephalic and atraumatic.   Nose: No congestion/rhinnorhea.   Mouth/Throat: Mucous membranes are moist.   Neck: No stridor. Cardiovascular: rapid rate, regular rhythm. No murmurs, rubs, or gallops. Respiratory: tachypnea with  rales bilaterally Gastrointestinal: Soft and nontender. Normal bowel sounds Musculoskeletal: Nontender with normal range of motion in extremities. No lower extremity tenderness nor edema. Neurologic:  Normal speech and language. No gross focal neurologic deficits are appreciated.  Skin:  Skin is warm, dry and intact. No rash noted. Psychiatric: Mood and affect are normal.  ____________________________________________  EKG: Interpreted by me.inus tachycardia at a rate of 115 bpm, normal PR interval, LVH with repolarization abnormality, possible anterior infarct age indeterminate, normal QT  ____________________________________________  ED COURSE:  Pertinent labs & imaging results that were available during my care of the patient were reviewed by me and considered in my medical decision making (see chart for details). Patient presents for respiratory distress and likely volume overloaded, we will assess with labs and imaging as indicated. Clinical Course as of Aug 22 915  Wed Aug 21, 2016  0916 Patient is improving with a blood pressure now of 170/120 on nitroglycerin. I discussed with nephrology who will arrange dialysis.  [  JW]  [id number] Clinical picture resembles more volume overload and pulmonary edema rather than pneumonia.  [JW]  [phone] We will get blood cultures, lactic acid and give IV antibiotics to cover for pneumonia although I think this is more pulmonary edema related  [JW]    Clinical Course User Index [JW] Earleen Newport, MD   Procedures ____________________________________________   LABS (pertinent positives/negatives)  Labs Reviewed  CBC WITH DIFFERENTIAL/PLATELET - Abnormal; Notable for the following:       Result Value   WBC 14.0 (*)    RDW 16.3 (*)    Neutro Abs 8.3 (*)    Lymphs Abs 4.5 (*)    All other components within normal limits  BASIC METABOLIC PANEL - Abnormal; Notable for the following:    CO2 20 (*)    Glucose, Bld 135 (*)    BUN 63 (*)     Creatinine, Ser 9.42 (*)    GFR calc non Af Amer 4 (*)    GFR calc Af Amer 5 (*)    All other components within normal limits  BRAIN NATRIURETIC PEPTIDE - Abnormal; Notable for the following:    B Natriuretic Peptide 2,034.0 (*)    All other components within normal limits  TROPONIN I - Abnormal; Notable for the following:    Troponin I 0.04 (*)    All other components within normal limits  BLOOD GAS, VENOUS - Abnormal; Notable for the following:    pH, Ven 7.24 (*)    All other components within normal limits  CULTURE, BLOOD (ROUTINE X 2)  CULTURE, BLOOD (ROUTINE X 2)  PROTIME-INR  LACTIC ACID, PLASMA  LACTIC ACID, PLASMA   CRITICAL CARE Performed by: Earleen Newport   Total critical care time: 30 minutes  Critical care time was exclusive of separately billable procedures and treating other patients.  Critical care was necessary to treat or prevent imminent or life-threatening deterioration.  Critical care was time spent personally by me on the following activities: development of treatment plan with patient and/or surrogate as well as nursing, discussions with consultants, evaluation of patient's response to treatment, examination of patient, obtaining history from patient or surrogate, ordering and performing treatments and interventions, ordering and review of laboratory studies, ordering and review of radiographic studies, pulse oximetry and re-evaluation of patient's condition.  RADIOLOGY Images were viewed by me  Chest x-ray reveals pulmonary edema versus pneumonia ____________________________________________  FINAL ASSESSMENT AND PLAN  Acute respiratory distress, Pulmonary edema, hypertension, possible pneumonia  Plan: Patient's labs and imaging were dictated above. Patient had presented for respiratory distress and likely volume overloaded. We'll place her on a nitroglycerin drip as well as give IV Lasix and discuss with nephrology for emergent hemodialysis.  Currently she is improving on a nitroglycerin drip and did receive a dose of IV Lasix. At this time she is improving but will need to be admitted to the intensive care unit for observation and further monitoring.   Earleen Newport, MD   Note: This note was generated in part or whole with voice recognition software. Voice recognition is usually quite accurate but there are transcription errors that can and very often do occur. I apologize for any typographical errors that were not detected and corrected.     Earleen Newport, MD 08/21/16 [phone]

## 2016-08-21 NOTE — H&P (Signed)
[place] [facility] [place] NAME: Teresa Cook    MR#:  [id number]  DATE OF BIRTH:  August 21, 1955  DATE OF ADMISSION:  08/21/2016  PRIMARY CARE PHYSICIAN: Murlean Iba, MD   REQUESTING/REFERRING PHYSICIAN: Dr. Lenise Arena  CHIEF COMPLAINT:   Chief Complaint  Patient presents with  . Respiratory Distress    HISTORY OF PRESENT ILLNESS:  Teresa Cook  is a 61 y.o. female with a known history of stage renal disease on hemodialysis, breast cancer, CHF, previous history of DVT, atrial fibrillation, gout, anxiety who presented to the hospital due to shortness of breath. patient says she has been feeling short of breath now for the past 2 days progressively getting worse. She admits to a cough which is nonproductive in nature.  No fever, chills, nausea, vomiting, abdominal pain or any associated symptoms.  EMS was called and patient's O2 sats upon arrival were in the mid 70s and she was brought to the ER for further evaluation.  In the emergency room patient was placed on BiPAP and a chest x-ray findings are suggestive of volume overload and pulmonary edema. Pt. Also had uncontrolled HTN with SBP  Greater than 180 and DBP greater than 100.  Hospitalist services were contacted further treatment and evaluation.  PAST MEDICAL HISTORY:   Past Medical History:  Diagnosis Date  . Anemia   . Anxiety   . Atrial fibrillation ([place])   . Breast cancer ([place]) 01/2016   bilateral  . CHF (congestive heart failure) ([place])   . Chronic kidney disease   . Depression   . Dialysis patient ([place])   . DVT (deep venous thrombosis) (HCC)    left leg  . DVT (deep venous thrombosis) ([place]) 1985   right thigh  . Dysrhythmia   . Gout   . Headache   . HTN (hypertension)   . Hypertension   . Parathyroid abnormality ([place])   . Parathyroid disease ([place])   . Pneumonia 12/2015  . Psoriasis   . Renal insufficiency   . Sickle cell trait ([place])    traits    PAST SURGICAL  HISTORY:   Past Surgical History:  Procedure Laterality Date  . ABDOMINAL HYSTERECTOMY  1980  . APPENDECTOMY    . BREAST BIOPSY Left 10/28/2013   benign  . BREAST EXCISIONAL BIOPSY Left 2002   benign  . INSERTION OF DIALYSIS CATHETER  2014  . LIPOMA EXCISION N/A 01/23/2016   Procedure: EXCISION LIPOMA;  Surgeon: Hubbard Robinson, MD;  Location: [facility] ORS;  Service: General;  Laterality: N/A;  . MASTECTOMY W/ SENTINEL NODE BIOPSY Bilateral 01/23/2016   Procedure: bilateral MASTECTOMY WITH  bilateral SENTINEL LYMPH NODE BIOPSY possible left axillary node dissection forehead lipoma removal;  Surgeon: Hubbard Robinson, MD;  Location: [facility] ORS;  Service: General;  Laterality: Bilateral;  . PARTIAL HYSTERECTOMY    . PERIPHERAL VASCULAR CATHETERIZATION N/A 12/25/2015   Procedure: Dialysis/Perma Catheter Insertion;  Surgeon: Algernon Huxley, MD;  Location: [place] [facility];  Service: Cardiovascular;  Laterality: N/A;  . PERIPHERAL VASCULAR CATHETERIZATION Left 01/22/2016   Procedure: Dialysis/Perma Catheter Insertion;  Surgeon: Algernon Huxley, MD;  Location: [place] [facility];  Service: Cardiovascular;  Laterality: Left;  . PERIPHERAL VASCULAR CATHETERIZATION N/A 01/26/2016   Procedure: Dialysis/Perma Catheter Insertion;  Surgeon: Katha Cabal, MD;  Location: [place] [facility];  Service: Cardiovascular;  Laterality: N/A;  . PORT-A-CATH REMOVAL N/A 12/20/2015   Procedure: REMOVAL PORT-A-CATH;  Surgeon: Lavona Mound  Loflin, MD;  Location: [facility];  Service: General;  Laterality: N/A;  left    . PORTACATH PLACEMENT Left 08/21/2015   Procedure: INSERTION PORT-A-CATH;  Surgeon: Hubbard Robinson, MD;  Location: [facility];  Service: General;  Laterality: Left;  . REMOVAL OF A DIALYSIS CATHETER  2017    SOCIAL HISTORY:   Social History  Substance Use Topics  . Smoking status: Never Smoker  . Smokeless tobacco: Never Used  . Alcohol use No    FAMILY HISTORY:   Family History  Problem  Relation Age of Onset  . Stroke Mother   . CVA Mother   . Hypertension Mother   . Hypertension Father   . Hypertension Sister   . Diabetes Sister   . Cancer Sister 17       Breast  . Stroke Brother   . Hypertension Brother   . Breast cancer Maternal Aunt 70    DRUG ALLERGIES:   Allergies  Allergen Reactions  . Gabapentin Other (See Comments)    Seizure  . Adhesive [Tape] Itching    Silk tape is ok to use.    REVIEW OF SYSTEMS:   Review of Systems  Constitutional: Negative for fever and weight loss.  HENT: Negative for congestion, nosebleeds and tinnitus.   Eyes: Negative for blurred vision, double vision and redness.  Respiratory: Positive for cough and shortness of breath. Negative for hemoptysis.   Cardiovascular: Negative for chest pain, orthopnea, leg swelling and PND.  Gastrointestinal: Negative for abdominal pain, diarrhea, melena, nausea and vomiting.  Genitourinary: Negative for dysuria, hematuria and urgency.  Musculoskeletal: Negative for falls and joint pain.  Neurological: Negative for dizziness, tingling, sensory change, focal weakness, seizures, weakness and headaches.  Endo/Heme/Allergies: Negative for polydipsia. Does not bruise/bleed easily.  Psychiatric/Behavioral: Negative for depression and memory loss. The patient is not nervous/anxious.     MEDICATIONS AT HOME:   Prior to Admission medications   Medication Sig Start Date End Date Taking? Authorizing Provider  losartan (COZAAR) 100 MG tablet Take 100 mg by mouth 2 (two) times daily.  09/07/14  Yes [provider]  metoprolol (LOPRESSOR) 100 MG tablet Take 0.5 tablets (50 mg total) by mouth 2 (two) times daily. Takes on non-dialysis days=Tuesday, Thursday, Saturday and Sunday. Patient taking differently: Take 100 mg by mouth 2 (two) times daily. Takes 100 MG DAILY on non-dialysis days= Monday, Wednesday, Friday 12/27/15  Yes [name], [name], MD  pantoprazole (PROTONIX) 40 MG tablet Take 1 tablet  (40 mg total) by mouth daily. Patient taking differently: Take 40 mg by mouth 2 (two) times daily.  03/01/16  Yes [name], MD  sertraline (ZOLOFT) 50 MG tablet Take 3 tablets (150 mg total) by mouth daily. Patient taking differently: Take 200 mg by mouth daily.  03/01/16  Yes [name], MD  sevelamer carbonate (RENVELA) 800 MG tablet Take 2,400 mg by mouth 3 (three) times daily with meals. Tid with meals and with 1 snack    Yes [provider]  warfarin (COUMADIN) 5 MG tablet take 5 mg on friday, saturday, sunday. and 1.5 mg on monday, tuesday, wednesday, thursday 11/03/13  Yes [provider]  zolpidem (AMBIEN) 5 MG tablet Take 1 tablet (5 mg total) by mouth at bedtime. 03/01/16  Yes [name], MD  acetaminophen (TYLENOL) 325 MG tablet Take 2 tablets (650 mg total) by mouth every 6 (six) hours as needed for mild pain (or Fever >/= 101). 12/27/15   [name], MD  ALPRAZolam Duanne Moron)  0.5 MG tablet Take 1 tablet (0.5 mg total) by mouth 2 (two) times daily as needed for anxiety. Patient not taking: Reported on 08/21/2016 12/27/15   [name], MD  amLODipine (NORVASC) 10 MG tablet Take 1 tablet (10 mg total) by mouth daily before supper. Take after checking blood pressure on Dialysis days, avoid taking it- if systolic BP < 242. Patient not taking: Reported on 08/21/2016 03/01/16   [name], MD  feeding supplement, ENSURE ENLIVE, (ENSURE ENLIVE) LIQD Take 237 mLs by mouth 3 (three) times daily between meals. Patient taking differently: Take 237 mLs by mouth daily.  12/27/15   [name], MD  letrozole (FEMARA) 2.5 MG tablet Take 1 tablet (2.5 mg total) by mouth daily. 05/07/16   [name], MD  ondansetron (ZOFRAN) 8 MG tablet Take by mouth every 8 (eight) hours as needed for nausea or vomiting.    [provider]  prochlorperazine (COMPAZINE) 10 MG tablet Take 10 mg by mouth every 6 (six) hours as needed for nausea  or vomiting.    [provider]  traMADol (ULTRAM) 50 MG tablet Take 1 tablet (50 mg total) by mouth every 6 (six) hours as needed for moderate pain. 02/14/16   Merlyn Lot, MD  UNABLE TO FIND Med Name: Prosthetic Bra with Inserts 04/23/16   [name], MD      VITAL SIGNS:  Blood pressure (!) 220/152, pulse 92, temperature 97.7 F (36.5 C), temperature source Oral, resp. rate (!) 34, weight 57.6 kg (127 lb), SpO2 96 %.  PHYSICAL EXAMINATION:  Physical Exam  GENERAL:  61 y.o.-year-old patient lying in the bed in mild respiratory distress.  EYES: Pupils equal, round, reactive to light and accommodation. No scleral icterus. Extraocular muscles intact.  HEENT: Head atraumatic, normocephalic. Oropharynx and nasopharynx clear. No oropharyngeal erythema, moist oral mucosa  NECK:  Supple, no jugular venous distention. No thyroid enlargement, no tenderness.  LUNGS: good air entry bilaterally, bibasilar Rales, few rhonchi, no wheezing. Positive use of accessory muscles. CARDIOVASCULAR: S1, S2 RRR. No murmurs, rubs, gallops, clicks.  ABDOMEN: Soft, nontender, nondistended. Bowel sounds present. No organomegaly or mass.  EXTREMITIES: No pedal edema, cyanosis, or clubbing. + 2 pedal & radial pulses b/l.   NEUROLOGIC: Cranial nerves II through XII are intact. No focal Motor or sensory deficits appreciated b/l PSYCHIATRIC: The patient is alert and oriented x 3. SKIN: No obvious rash, lesion, or ulcer.   LABORATORY PANEL:   CBC  Recent Labs Lab 08/21/16 0817  WBC 14.0*  HGB 13.9  HCT 42.8  PLT 251   ------------------------------------------------------------------------------------------------------------------  Chemistries   Recent Labs Lab 08/21/16 0817  NA 139  K 4.9  CL 104  CO2 20*  GLUCOSE 135*  BUN 63*  CREATININE 9.42*  CALCIUM 9.9    ------------------------------------------------------------------------------------------------------------------  Cardiac Enzymes  Recent Labs Lab 08/21/16 0817  TROPONINI 0.04*   ------------------------------------------------------------------------------------------------------------------  RADIOLOGY:  Dg Chest Port 1 View  Result Date: 08/21/2016 CLINICAL DATA:  Pneumonia.  Dialysis with atrial fibrillation. EXAM: PORTABLE CHEST 1 VIEW COMPARISON:  03/06/2016 FINDINGS: 0820 hours. Cardiopericardial silhouette is at upper limits of normal for size. Asymmetric diffuse right central airspace disease. No substantial airspace opacity in the left lung. Left dialysis catheter remains in place. Bones are demineralized. Telemetry leads overlie the chest. IMPRESSION: Imaging features suggest diffuse right-sided pneumonia. Asymmetric edema less likely. Electronically Signed   By: Misty Stanley M.D.   On: 08/21/2016 09:05     IMPRESSION AND PLAN:  Teresa Cook  is a 61 y.o. female with a known history of stage renal disease on hemodialysis, breast cancer, CHF, previous history of DVT, atrial fibrillation, gout, anxiety who presented to the hospital due to shortness of breath.   1. Acute respiratory failure with hypoxia-this is secondary to pulmonary edema and volume overload. Patient's chest x-ray was suggestive of this. Patient recently was placed on BiPAP, we'll get urgent hemodialysis today for fluid removal. Wean oxygen and BiPAP as tolerated after hemodialysis.  2. CHF/pulmonary edema-this is acute on chronic diastolic dysfunction. Patient to get urgent hemodialysis and will follow clinically.  - cont. Metoprolol, Losartan  3. Accelerated/Uncontrolled HTN - currently on Nitro gtt.  - will resume her Metoprolol, Losartan. Will add PRN Hydralazine, labetalol and wean off nitro gtt as tolerated.   4. Hx of a. Fib - rate controlled.  - cont. Metoprolol. Cont. Warfarin and  pharmacy to dose.   5. Secondary Hyperparathyroidism - cont. Renvela  6. Depression - cont. Zoloft.    7. Anxiety - cont. Xanax.     All the records are reviewed and case discussed with ED provider. Management plans discussed with the patient, family and they are in agreement.  CODE STATUS: Full code  TOTAL Critical Care TIME TAKING CARE OF THIS PATIENT: 45 minutes.    [name] M.D on 08/21/2016 at 9:53 AM  Between 7am to 6pm - Pager - [phone]  After 6pm go to [email or web address] - password [email or web address] [place] [facility]  Office  [phone]  CC: Primary care physician; Murlean Iba, MD

## 2016-08-21 NOTE — Progress Notes (Signed)
Pre dialysis. All safety checks currently being performed at bedside in ICU

## 2016-08-21 NOTE — Care Management (Signed)
Teresa Cook with [facility]/liaison for dialysis notified of patient presentation.

## 2016-08-21 NOTE — Progress Notes (Addendum)
Patient admitted to unit on bipap at 50%. Patient was placed on 2L around 1130. Patient appears to be in no respiratory distress at this time. HR intermittently increasing to 160's, no daily medications taken today reported by patient. Patient stated that she does not feel like she can take PO medications right now because she feels lightheaded and like she may "pass out". Dr. Mortimer Fries notified and gave verbal order for IV metoprolol. PO meds held, IV medication given. Will continue to assess. Patient to have dialysis this afternoon. Teresa Cook

## 2016-08-21 NOTE — Progress Notes (Signed)
[facility]  ROUNDING NOTE   Subjective:   [name] admitted to [facility] on 08/21/2016 for End stage renal disease ([place]) [N18.6] Acute pulmonary edema ([place]) [J81.0] Respiratory distress [R06.03]   Placed on BIPAP and admitted to ICU. Last hemodialysis was Monday. Achieved target weight of 56.5kg   Now with pulmonary edema and requires urgent dialysis. With hypertensive urgency. Placed on nitro gtt.   Objective:  Vital signs in last 24 hours:  Temp:  [97.7 F (36.5 C)] 97.7 F (36.5 C) (08/15 0826) Pulse Rate:  [83-122] 103 (08/15 1000) Resp:  [22-38] 22 (08/15 1000) BP: (165-220)/(119-152) 179/137 (08/15 1112) SpO2:  [88 %-100 %] 100 % (08/15 1000) FiO2 (%):  [50 %] 50 % (08/15 0826) Weight:  [57.6 kg (127 lb)] 57.6 kg (127 lb) (08/15 0816)  Weight change:  Filed Weights   08/21/16 0816  Weight: 57.6 kg (127 lb)    Intake/Output: No intake/output data recorded.   Intake/Output this shift:  No intake/output data recorded.  Physical Exam: General: Critically ill  Head: +BIPAP  Eyes: Anicteric, PERRL  Neck: Supple, trachea midline  Lungs:  Bilateral crackles  Heart: tachycardia  Abdomen:  Soft, nontender  Extremities: no peripheral edema.  Neurologic: Nonfocal, moving all four extremities  Skin: No lesions  Access: Left IJ permcath    Basic Metabolic Panel:  Recent Labs Lab 08/21/16 0817  NA 139  K 4.9  CL 104  CO2 20*  GLUCOSE 135*  BUN 63*  CREATININE 9.42*  CALCIUM 9.9    Liver Function Tests: No results for input(s): AST, ALT, ALKPHOS, BILITOT, PROT, ALBUMIN in the last 168 hours. No results for input(s): LIPASE, AMYLASE in the last 168 hours. No results for input(s): AMMONIA in the last 168 hours.  CBC:  Recent Labs Lab 08/21/16 0817  WBC 14.0*  NEUTROABS 8.3*  HGB 13.9  HCT 42.8  MCV 88.5  PLT 251    Cardiac Enzymes:  Recent Labs Lab 08/21/16 0817  TROPONINI 0.04*    BNP: Invalid input(s):  POCBNP  CBG:  Recent Labs Lab 08/21/16 1100  GLUCAP 48    Microbiology: Results for orders placed or performed during the hospital encounter of 02/29/16  MRSA PCR Screening     Status: None   Collection Time: 02/29/16  6:12 PM  Result Value Ref Range Status   MRSA by PCR NEGATIVE NEGATIVE Final    Comment:        The GeneXpert MRSA Assay (FDA approved for NASAL specimens only), is one component of a comprehensive MRSA colonization surveillance program. It is not intended to diagnose MRSA infection nor to guide or monitor treatment for MRSA infections.     Coagulation Studies:  Recent Labs  08/21/16 0817  LABPROT 26.4*  INR 2.38    Urinalysis: No results for input(s): COLORURINE, LABSPEC, PHURINE, GLUCOSEU, HGBUR, BILIRUBINUR, KETONESUR, PROTEINUR, UROBILINOGEN, NITRITE, LEUKOCYTESUR in the last 72 hours.  Invalid input(s): APPERANCEUR    Imaging: Dg Chest Port 1 View  Result Date: 08/21/2016 CLINICAL DATA:  Pneumonia.  Dialysis with atrial fibrillation. EXAM: PORTABLE CHEST 1 VIEW COMPARISON:  03/06/2016 FINDINGS: 0820 hours. Cardiopericardial silhouette is at upper limits of normal for size. Asymmetric diffuse right central airspace disease. No substantial airspace opacity in the left lung. Left dialysis catheter remains in place. Bones are demineralized. Telemetry leads overlie the chest. IMPRESSION: Imaging features suggest diffuse right-sided pneumonia. Asymmetric edema less likely. Electronically Signed   By: Misty Stanley M.D.   On: 08/21/2016  09:05     Medications:    . [START ON 08/22/2016] letrozole  2.5 mg Oral Daily  . losartan  100 mg Oral BID  . metoprolol tartrate  100 mg Oral Daily  . [START ON 08/22/2016] metoprolol tartrate  100 mg Oral Q T,Th,S,Su-1800  . pantoprazole  40 mg Oral BID  . sertraline  200 mg Oral Daily  . sevelamer carbonate  2,400 mg Oral TID WC  . zolpidem  5 mg Oral QHS   acetaminophen **OR** acetaminophen, hydrALAZINE,  labetalol, morphine injection, ondansetron **OR** ondansetron (ZOFRAN) IV, traMADol  Assessment/ Plan:  [name] is a 61 y.o. black female with ESRD on hemodialysis, breast cancer status post mastectomy, congestive heart failure, depression, DVT, seizure disorder, anemia, sickle cell trait, hypertension, gout, atrial fibrillation  [place]. MWF  1. End Stage Renal Disease:  - Needs urgent hemodialysis today. Urgent treatment. Orders prepared.   2. Acute Respiratory Failure: on BIPAP. With flash pulmonary edema. Echo from 09/7987 with systolic and diastolic dysfunction. EF 40-45%. Tricuspid valve moderate regurgitation.  - Appreciate pulmonary input.   3. Hypertension: with urgency. Placed on nitro gtt.  - restart home regimen of losartan, metoprolol   4. Anemia of chronic kidney disease: hemoglobin 13.9. Holding epo.   5. Secondary Hyperparathyroidism: PTH 410, phos 7.5 and calcium 9.6 outpatient on 8/13.  - sevelamer 3 tabs with meals.     LOS: 0 Teresa Cook 8/15/201812:07 PM

## 2016-08-21 NOTE — Progress Notes (Signed)
Treatment initiated at bedside in ICU. MD notified that patient states "I dont want to run 3.5 hours." Discussed the importance of a longer treatment for fluid removal. No current complaints. Vitals stable. Continue to monitor closely

## 2016-08-21 NOTE — Progress Notes (Signed)
Pt. Taken off bipap and placed on 2 L nasal cannula

## 2016-08-21 NOTE — ED Notes (Signed)
Attempted report. Staff reported Brandi making rounds at this time

## 2016-08-21 NOTE — Progress Notes (Signed)
Pre dialysis Patient off bipap currently in no distress. 2L [place] with sats 93%. Denies pain/complaints.

## 2016-08-21 NOTE — ED Notes (Signed)
X-ray at bedside

## 2016-08-21 NOTE — Progress Notes (Signed)
Patient arrived on unit from ED, nitroglycerin infusion only ordered once. Conferred with Dr. Mortimer Fries who assessed patient at bedside and ordered nitroglycerin infusion to stop and not restart, IV hydralazine 20 mg one time for elevated BP, and one time IV morphine 2 mg to help patient's breathing and pain reported to MD.

## 2016-08-21 NOTE — Progress Notes (Signed)
HD completed without issue. All vitals stable post rinseback. Patient currently has no complaints other than a headache. Total UF 2.5L. Report given to primary RN.

## 2016-08-21 NOTE — Progress Notes (Signed)
[place] for Warfarin Dosing Indication: History of DVT  Allergies  Allergen Reactions  . Gabapentin Other (See Comments)    Seizure  . Adhesive [Tape] Itching    Silk tape is ok to use.    Patient Measurements: Weight: 127 lb (57.6 kg)   Vital Signs: Temp: 97.7 F (36.5 C) (08/15 0826) Temp Source: Oral (08/15 0826) BP: 179/137 (08/15 1112) Pulse Rate: 103 (08/15 1000) Intake/Output from previous day: No intake/output data recorded. Intake/Output from this shift: No intake/output data recorded.  Labs:  Recent Labs  08/21/16 0817  WBC 14.0*  HGB 13.9  HCT 42.8  PLT 251  CREATININE 9.42*   Estimated Creatinine Clearance: 5.6 mL/min (A) (by C-G formula based on SCr of 9.42 mg/dL (H)).   Microbiology: No results found for this or any previous visit (from the past 720 hour(s)).  Medications:  Scheduled:  . [START ON 08/22/2016] letrozole  2.5 mg Oral Daily  . losartan  100 mg Oral BID  . metoprolol tartrate  100 mg Oral Daily  . [START ON 08/22/2016] metoprolol tartrate  100 mg Oral Q T,Th,S,Su-1800  . pantoprazole  40 mg Oral BID  . sertraline  200 mg Oral Daily  . sevelamer carbonate  2,400 mg Oral TID WC  . zolpidem  5 mg Oral QHS    Assessment: Patient Admitted on 8/15. INR came back therapeutic (2.38); should continue home dose.   Goal of Therapy:  Maintain INR between 2-3  Plan:  Initiate home warfarin 1.5 mg Monday, Tuesday, Wednesday, and Thursday; 5 mg Friday, Saturday, Sunday. Will obtain INR daily while hospitalized.  Brett Fairy 08/21/2016,11:36 AM

## 2016-08-21 NOTE — ED Triage Notes (Signed)
Patient called EMS from home for respiratory distress. Sats RA were 80s. Patient arrived on CPAP with 92% O2 sats. Dialysis patient on Monday, Wednesday, and Friday. Last treatment Monday went well. EMS administered 1 inch nitro and 125 solumedrol. EMS vitals 221/161 b/p, 130s sinus tach. HX pneumonia and A-fib. Patient reported this happened before and cause was pneumonia.

## 2016-08-21 NOTE — Consult Note (Signed)
Name: Teresa Cook MRN: [id number] DOB: 23-May-1955     CONSULTATION DATE: 08/21/2016  REFERRING MD : Verdell Carmine  CHIEF COMPLAINT: resp distress  STUDIES:  Chest x-ray  08/21/16 images reviewed 08/21/16 Interpretation: Bilateral multifocal interstitial opacities  HISTORY OF PRESENT ILLNESS: 61 year old African American female with end-stage renal disease on dialysis Monday Wednesday Friday admitted to the intensive care unit stepdown status for acute shortness of breath related to bilateral multifocal interstitial opacities based on chest x-ray findings Patient placed on BiPAP therapy and was admitted to stepdown for further monitoring Patient states that she has not had any fevers or chills and no productive cough but has increased work of breathing over the last several days patient states that she had not missed her dialysis days Patient may think that they have not pulled and the fluid over the last several treatments Patient noted to have a very high blood pressure upon arrival to the intensive care unit with a blood pressure 180/110  Echo report from January shows an EF of 40% There are no signs of symptoms of infection at this time Patient currently on BiPAP therapy and will try to transition off and assess respiratory status  PAST MEDICAL HISTORY :   has a past medical history of Anemia; Anxiety; Atrial fibrillation ([place]); Breast cancer ([place]) (01/2016); CHF (congestive heart failure) ([place]); Chronic kidney disease; Depression; Dialysis patient [facility]); DVT (deep venous thrombosis) ([place]); DVT (deep venous thrombosis) ([place]) (1985); Dysrhythmia; Gout; Headache; HTN (hypertension); Hypertension; Parathyroid abnormality ([employer]); Parathyroid disease ([place]); Pneumonia (12/2015); Psoriasis; Renal insufficiency; and Sickle cell trait ([place]).  has a past surgical history that includes Partial hysterectomy; Appendectomy; Breast biopsy (Left, 10/28/2013); Breast excisional biopsy (Left, 2002);  Insertion of dialysis catheter (2014); Portacath placement (Left, 08/21/2015); Port-a-cath removal (N/A, 12/20/2015); Cardiac catheterization (N/A, 12/25/2015); Removal of a dialysis catheter (2017); Abdominal hysterectomy (1980); Cardiac catheterization (Left, 01/22/2016); Mastectomy w/ sentinel node biopsy (Bilateral, 01/23/2016); Lipoma excision (N/A, 01/23/2016); and Cardiac catheterization (N/A, 01/26/2016). Prior to Admission medications   Medication Sig Start Date End Date Taking? Authorizing Provider  losartan (COZAAR) 100 MG tablet Take 100 mg by mouth 2 (two) times daily.  09/07/14  Yes [provider]  metoprolol (LOPRESSOR) 100 MG tablet Take 0.5 tablets (50 mg total) by mouth 2 (two) times daily. Takes on non-dialysis days=Tuesday, Thursday, Saturday and Sunday. Patient taking differently: Take 100 mg by mouth 2 (two) times daily. Takes 100 MG DAILY on non-dialysis days= Monday, Wednesday, Friday 12/27/15  Yes [name], [name], MD  pantoprazole (PROTONIX) 40 MG tablet Take 1 tablet (40 mg total) by mouth daily. Patient taking differently: Take 40 mg by mouth 2 (two) times daily.  03/01/16  Yes [name], MD  sertraline (ZOLOFT) 50 MG tablet Take 3 tablets (150 mg total) by mouth daily. Patient taking differently: Take 200 mg by mouth daily.  03/01/16  Yes [name], MD  sevelamer carbonate (RENVELA) 800 MG tablet Take 2,400 mg by mouth 3 (three) times daily with meals. Tid with meals and with 1 snack    Yes [provider]  warfarin (COUMADIN) 5 MG tablet take 5 mg on friday, saturday, sunday. and 1.5 mg on monday, tuesday, wednesday, thursday 11/03/13  Yes [provider]  zolpidem (AMBIEN) 5 MG tablet Take 1 tablet (5 mg total) by mouth at bedtime. 03/01/16  Yes [name], MD  acetaminophen (TYLENOL) 325 MG tablet Take 2 tablets (650 mg total) by mouth every 6 (six) hours as needed for mild pain (or Fever >/=  101). 12/27/15   [name],  [name], MD  ALPRAZolam Duanne Moron) 0.5 MG tablet Take 1 tablet (0.5 mg total) by mouth 2 (two) times daily as needed for anxiety. Patient not taking: Reported on 08/21/2016 12/27/15   [name], MD  amLODipine (NORVASC) 10 MG tablet Take 1 tablet (10 mg total) by mouth daily before supper. Take after checking blood pressure on Dialysis days, avoid taking it- if systolic BP < 948. Patient not taking: Reported on 08/21/2016 03/01/16   [name], MD  feeding supplement, ENSURE ENLIVE, (ENSURE ENLIVE) LIQD Take 237 mLs by mouth 3 (three) times daily between meals. Patient taking differently: Take 237 mLs by mouth daily.  12/27/15   [name], MD  letrozole (FEMARA) 2.5 MG tablet Take 1 tablet (2.5 mg total) by mouth daily. 05/07/16   [name], MD  ondansetron (ZOFRAN) 8 MG tablet Take by mouth every 8 (eight) hours as needed for nausea or vomiting.    [provider]  prochlorperazine (COMPAZINE) 10 MG tablet Take 10 mg by mouth every 6 (six) hours as needed for nausea or vomiting.    [provider]  traMADol (ULTRAM) 50 MG tablet Take 1 tablet (50 mg total) by mouth every 6 (six) hours as needed for moderate pain. 02/14/16   Merlyn Lot, MD  UNABLE TO FIND Med Name: Prosthetic Bra with Inserts 04/23/16   [name], MD   Allergies  Allergen Reactions  . Gabapentin Other (See Comments)    Seizure  . Adhesive [Tape] Itching    Silk tape is ok to use.    FAMILY HISTORY:  family history includes Breast cancer (age of onset: 14) in her maternal aunt; CVA in her mother; Cancer (age of onset: 11) in her sister; Diabetes in her sister; Hypertension in her brother, father, mother, and sister; Stroke in her brother and mother. SOCIAL HISTORY:  reports that she has never smoked. She has never used smokeless tobacco. She reports that she does not drink alcohol or use drugs.  REVIEW OF SYSTEMS:   Constitutional: Negative for fever, chills, weight loss,  +malaise/fatigue and diaphoresis.  HENT: Negative for hearing loss, ear pain, nosebleeds, congestion, sore throat, neck pain, tinnitus and ear discharge.   Eyes: Negative for blurred vision, double vision, photophobia, pain, discharge and redness.  Respiratory: Negative for cough, hemoptysis, sputum production, +shortness of breath, wheezing and stridor.   Cardiovascular: Negative for chest pain, palpitations, orthopnea, claudication, leg swelling and PND.  Gastrointestinal: Negative for heartburn, nausea, vomiting, abdominal pain, diarrhea, constipation, blood in stool and melena.  Genitourinary: Negative for dysuria, urgency, frequency, hematuria and flank pain.  Musculoskeletal: Negative for myalgias, back pain, joint pain and falls.  Skin: Negative for itching and rash.  Neurological: Negative for dizziness, tingling, tremors, sensory change, speech change, focal weakness, seizures, loss of consciousness, weakness and headaches.  Endo/Heme/Allergies: Negative for environmental allergies and polydipsia. Does not bruise/bleed easily.  ALL OTHER ROS ARE NEGATIVE    VITAL SIGNS: Temp:  [97.7 F (36.5 C)] 97.7 F (36.5 C) (08/15 0826) Pulse Rate:  [83-122] 103 (08/15 1000) Resp:  [22-38] 22 (08/15 1000) BP: (165-220)/(119-152) 179/137 (08/15 1112) SpO2:  [88 %-100 %] 100 % (08/15 1000) FiO2 (%):  [50 %] 50 % (08/15 0826) Weight:  [127 lb (57.6 kg)] 127 lb (57.6 kg) (08/15 0816)  Physical Examination:   GENERAL:NAD, no fevers, chills, no weakness no fatigue HEAD: Normocephalic, atraumatic.  EYES: Pupils equal, round, reactive to light. Extraocular muscles intact. No  scleral icterus.  MOUTH: Moist mucosal membrane.   EAR, NOSE, THROAT: Clear without exudates. No external lesions.  NECK: Supple. No thyromegaly. No nodules. No JVD.  PULMONARY:CTA B/L no wheezes, + crackles, + rhonchi CARDIOVASCULAR: S1 and S2. Regular rate and rhythm. No murmurs, rubs, or gallops. No edema.    GASTROINTESTINAL: Soft, nontender, nondistended. No masses. Positive bowel sounds.  MUSCULOSKELETAL: No swelling, clubbing, or edema. Range of motion full in all extremities.  NEUROLOGIC: Cranial nerves II through XII are intact. No gross focal neurological deficits.  SKIN: No ulceration, lesions, rashes, or cyanosis. Skin warm and dry. Turgor intact.  PSYCHIATRIC: Mood, affect within normal limits. The patient is awake, alert and oriented x 3. Insight, judgment intact.       Recent Labs Lab 08/21/16 0817  NA 139  K 4.9  CL 104  CO2 20*  BUN 63*  CREATININE 9.42*  GLUCOSE 135*    ASSESSMENT / PLAN: 61 year old [place] female admitted to the intensive care unit for acute respiratory distress secondary to bilateral interstitial infiltrates most likely related to pulmonary edema in the setting of end-stage renal disease on dialysis with hypertensive crisis along with a cardiomyopathy with ejection fraction of 40%  #1 plan for BiPAP therapy -Will wean off as tolerated #2 oxygen therapy as needed #3 plan for emergent hemodialysis today #4 continue stepdown status monitoring #5 hypertensive crisis will need dialysis and when necessary antihypertensives  Patient/Family are satisfied with Plan of action and management. All questions answered  Corrin Parker, M.D.  [facility]  Medical Director [place] Director [facility] Cardio-Pulmonary Department

## 2016-08-22 ENCOUNTER — Inpatient Hospital Stay: Payer: Medicare Other

## 2016-08-22 DIAGNOSIS — J810 Acute pulmonary edema: Secondary | ICD-10-CM

## 2016-08-22 LAB — BLOOD CULTURE ID PANEL (REFLEXED)
Acinetobacter baumannii: NOT DETECTED
Candida albicans: NOT DETECTED
Candida glabrata: NOT DETECTED
Candida krusei: NOT DETECTED
Candida parapsilosis: NOT DETECTED
Candida tropicalis: NOT DETECTED
ENTEROBACTER CLOACAE COMPLEX: NOT DETECTED
ENTEROCOCCUS SPECIES: NOT DETECTED
Enterobacteriaceae species: NOT DETECTED
Escherichia coli: NOT DETECTED
HAEMOPHILUS INFLUENZAE: NOT DETECTED
Klebsiella oxytoca: NOT DETECTED
Klebsiella pneumoniae: NOT DETECTED
LISTERIA MONOCYTOGENES: NOT DETECTED
METHICILLIN RESISTANCE: DETECTED — AB
NEISSERIA MENINGITIDIS: NOT DETECTED
PROTEUS SPECIES: NOT DETECTED
Pseudomonas aeruginosa: NOT DETECTED
SERRATIA MARCESCENS: NOT DETECTED
STAPHYLOCOCCUS AUREUS BCID: NOT DETECTED
STAPHYLOCOCCUS SPECIES: DETECTED — AB
STREPTOCOCCUS AGALACTIAE: NOT DETECTED
STREPTOCOCCUS SPECIES: NOT DETECTED
Streptococcus pneumoniae: NOT DETECTED
Streptococcus pyogenes: NOT DETECTED

## 2016-08-22 LAB — BASIC METABOLIC PANEL
ANION GAP: 10 (ref 5–15)
BUN: 50 mg/dL — ABNORMAL HIGH (ref 6–20)
CO2: 28 mmol/L (ref 22–32)
Calcium: 9.8 mg/dL (ref 8.9–10.3)
Chloride: 102 mmol/L (ref 101–111)
Creatinine, Ser: 7.83 mg/dL — ABNORMAL HIGH (ref 0.44–1.00)
GFR calc Af Amer: 6 mL/min — ABNORMAL LOW (ref >60)
GFR, EST NON AFRICAN AMERICAN: 5 mL/min — AB (ref >60)
GLUCOSE: 94 mg/dL (ref 65–99)
POTASSIUM: 4.9 mmol/L (ref 3.5–5.1)
Sodium: 140 mmol/L (ref 135–145)

## 2016-08-22 LAB — CBC
HEMATOCRIT: 36.1 % (ref 35.0–47.0)
HEMOGLOBIN: 12.1 g/dL (ref 12.0–16.0)
MCH: 29.7 pg (ref 26.0–34.0)
MCHC: 33.6 g/dL (ref 32.0–36.0)
MCV: 88.4 fL (ref 80.0–100.0)
Platelets: 165 10*3/uL (ref 150–440)
RBC: 4.08 MIL/uL (ref 3.80–5.20)
RDW: 16 % — ABNORMAL HIGH (ref 11.5–14.5)
WBC: 7.9 10*3/uL (ref 3.6–11.0)

## 2016-08-22 LAB — PROTIME-INR
INR: 2.49
Prothrombin Time: 27.4 seconds — ABNORMAL HIGH (ref 11.4–15.2)

## 2016-08-22 LAB — PHOSPHORUS: PHOSPHORUS: 6.8 mg/dL — AB (ref 2.5–4.6)

## 2016-08-22 LAB — MAGNESIUM: Magnesium: 2.3 mg/dL (ref 1.7–2.4)

## 2016-08-22 NOTE — Progress Notes (Signed)
[facility]  ROUNDING NOTE   Subjective:   Hemodialysis treatment yesterday. UF of 2.4 liters  Breathing room air  Objective:  Vital signs in last 24 hours:  Temp:  [97.2 F (36.2 C)-99.3 F (37.4 C)] 98.5 F (36.9 C) (08/16 0730) Pulse Rate:  [56-135] 103 (08/16 0900) Resp:  [10-33] 18 (08/16 0900) BP: (99-191)/(67-137) 132/103 (08/16 0900) SpO2:  [86 %-100 %] 96 % (08/16 0900) Weight:  [56.2 kg (123 lb 14.4 oz)-58.8 kg (129 lb 10.1 oz)] 56.2 kg (123 lb 14.4 oz) (08/15 1651)  Weight change:  Filed Weights   08/21/16 1215 08/21/16 1334 08/21/16 1651  Weight: 58.8 kg (129 lb 10.1 oz) 58.8 kg (129 lb 10.1 oz) 56.2 kg (123 lb 14.4 oz)    Intake/Output: I/O last 3 completed shifts: In: 12 [I.V.:12] Out: 2400 [Other:2400]   Intake/Output this shift:  No intake/output data recorded.  Physical Exam: General: Critically ill  Head: [place]/AT  Eyes: Anicteric, PERRL  Neck: Supple, trachea midline  Lungs:  Clear bilaterally  Heart: irregular  Abdomen:  Soft, nontender  Extremities: no peripheral edema.  Neurologic: Nonfocal, moving all four extremities  Skin: No lesions  Access: Left IJ permcath    Basic Metabolic Panel:  Recent Labs Lab 08/21/16 0817 08/22/16 0442  NA 139 140  K 4.9 4.9  CL 104 102  CO2 20* 28  GLUCOSE 135* 94  BUN 63* 50*  CREATININE 9.42* 7.83*  CALCIUM 9.9 9.8  MG  --  2.3  PHOS  --  6.8*    Liver Function Tests: No results for input(s): AST, ALT, ALKPHOS, BILITOT, PROT, ALBUMIN in the last 168 hours. No results for input(s): LIPASE, AMYLASE in the last 168 hours. No results for input(s): AMMONIA in the last 168 hours.  CBC:  Recent Labs Lab 08/21/16 0817 08/22/16 0442  WBC 14.0* 7.9  NEUTROABS 8.3*  --   HGB 13.9 12.1  HCT 42.8 36.1  MCV 88.5 88.4  PLT 251 165    Cardiac Enzymes:  Recent Labs Lab 08/21/16 0817  TROPONINI 0.04*    BNP: Invalid input(s): POCBNP  CBG:  Recent Labs Lab 08/21/16 1100   GLUCAP 98    Microbiology: Results for orders placed or performed during the hospital encounter of 08/21/16  Blood culture (routine x 2)     Status: None (Preliminary result)   Collection Time: 08/21/16 10:04 AM  Result Value Ref Range Status   Specimen Description BLOOD LEFT AC  Final   Special Requests   Final    BOTTLES DRAWN AEROBIC AND ANAEROBIC Blood Culture adequate volume   Culture NO GROWTH < 24 HOURS  Final   Report Status PENDING  Incomplete  Blood culture (routine x 2)     Status: None (Preliminary result)   Collection Time: 08/21/16 10:13 AM  Result Value Ref Range Status   Specimen Description BLOOD RIGHT AC  Final   Special Requests   Final    BOTTLES DRAWN AEROBIC AND ANAEROBIC Blood Culture adequate volume   Culture NO GROWTH < 24 HOURS  Final   Report Status PENDING  Incomplete  MRSA PCR Screening     Status: None   Collection Time: 08/21/16 11:03 AM  Result Value Ref Range Status   MRSA by PCR NEGATIVE NEGATIVE Final    Comment:        The GeneXpert MRSA Assay (FDA approved for NASAL specimens only), is one component of a comprehensive MRSA colonization surveillance program. It is not  intended to diagnose MRSA infection nor to guide or monitor treatment for MRSA infections.     Coagulation Studies:  Recent Labs  08/21/16 0817 08/22/16 0442  LABPROT 26.4* 27.4*  INR 2.38 2.49    Urinalysis: No results for input(s): COLORURINE, LABSPEC, PHURINE, GLUCOSEU, HGBUR, BILIRUBINUR, KETONESUR, PROTEINUR, UROBILINOGEN, NITRITE, LEUKOCYTESUR in the last 72 hours.  Invalid input(s): APPERANCEUR    Imaging: Dg Chest Port 1 View  Result Date: 08/22/2016 CLINICAL DATA:  Pneumonia. EXAM: PORTABLE CHEST 1 VIEW COMPARISON:  08/21/2016. FINDINGS: Left IJ dialysis catheter noted with tip at the cavoatrial junction in stable position. Stable mild cardiomegaly. Interim near complete clearing of diffuse interstitial prominence suggesting clearing CHF. Small left  pleural effusion. Pneumothorax P IMPRESSION: 1.  Dialysis catheter stable position. 2. Interval near complete clearing of diffuse interstitial prominence suggesting clearing CHF. Small left pleural effusion. Electronically Signed   By: Marcello Moores  Register   On: 08/22/2016 07:03   Dg Chest Port 1 View  Result Date: 08/21/2016 CLINICAL DATA:  Pneumonia.  Dialysis with atrial fibrillation. EXAM: PORTABLE CHEST 1 VIEW COMPARISON:  03/06/2016 FINDINGS: 0820 hours. Cardiopericardial silhouette is at upper limits of normal for size. Asymmetric diffuse right central airspace disease. No substantial airspace opacity in the left lung. Left dialysis catheter remains in place. Bones are demineralized. Telemetry leads overlie the chest. IMPRESSION: Imaging features suggest diffuse right-sided pneumonia. Asymmetric edema less likely. Electronically Signed   By: Misty Stanley M.D.   On: 08/21/2016 09:05     Medications:    . letrozole  2.5 mg Oral Daily  . losartan  100 mg Oral BID  . metoprolol tartrate  100 mg Oral Daily  . metoprolol tartrate  100 mg Oral Q T,Th,S,Su-1800  . pantoprazole  40 mg Oral BID  . sertraline  200 mg Oral Daily  . sevelamer carbonate  2,400 mg Oral TID WC  . warfarin  1.5 mg Oral Once per day on Mon Tue Wed Thu  . [START ON 08/23/2016] warfarin  5 mg Oral Once per day on Sun Fri Sat  . Warfarin - Pharmacist Dosing Inpatient   Does not apply q1800  . zolpidem  5 mg Oral QHS   acetaminophen **OR** acetaminophen, hydrALAZINE, labetalol, morphine injection, ondansetron **OR** ondansetron (ZOFRAN) IV, traMADol  Assessment/ Plan:  Teresa Cook is a 61 y.o. black female with ESRD on hemodialysis, breast cancer status post mastectomy, congestive heart failure, depression, DVT, seizure disorder, anemia, sickle cell trait, hypertension, gout, atrial fibrillation  [place]. MWF  1. End Stage Renal Disease: urgent dialysis for flash pulmonary edema and respiratory  failure requiring noninvasive ventilation on admission.  - Hemodialysis treatment yesterday. UF of 2.4 liters - Continue MWF schedule.   2. Acute Respiratory Failure: on BIPAP. With flash pulmonary edema on admission. Echo from 04/9700 with systolic and diastolic dysfunction. EF 40-45%. Tricuspid valve moderate regurgitation.  - Appreciate pulmonary input.  Resolved. Breathing room air.   3. Hypertension: with urgency on admission - Continue losartan and metoprolol   4. Anemia of chronic kidney disease: hemoglobin 12.1. Holding epo.   5. Secondary Hyperparathyroidism: PTH 410, phos 7.5 and calcium 9.6 outpatient on 8/13.  - sevelamer 3 tabs with meals.     LOS: 1 Clayton Jarmon 8/16/201810:01 AM

## 2016-08-22 NOTE — Progress Notes (Addendum)
Name: Teresa Cook MRN: [id number] DOB: July 31, 1955        HISTORY OF PRESENT ILLNESS: 61 year old [place] female admitted to the intensive care unit for acute respiratory distress secondary to bilateral interstitial infiltrates most likely related to pulmonary edema in the setting of end-stage renal disease on dialysis with hypertensive crisis along with a cardiomyopathy with ejection fraction of 40%  REVIEW OF SYSTEMS:   Constitutional: Negative for fever, chills, weight loss, +malaise/fatigue and diaphoresis.  HENT: Negative for hearing loss, ear pain, nosebleeds, congestion, sore throat, neck pain, tinnitus and ear discharge.   Eyes: Negative for blurred vision, double vision, photophobia, pain, discharge and redness.  Respiratory: Negative for cough, hemoptysis, sputum production, +shortness of breath, wheezing and stridor.   Cardiovascular: Negative for chest pain, palpitations, orthopnea, claudication, leg swelling and PND.  Gastrointestinal: Negative for heartburn, nausea, vomiting, abdominal pain, diarrhea, constipation, blood in stool and melena.  Genitourinary: Negative for dysuria, urgency, frequency, hematuria and flank pain.  Musculoskeletal: Negative for myalgias, back pain, joint pain and falls.  Skin: Negative for itching and rash.  Neurological: Negative for dizziness, tingling, tremors, sensory change, speech change, focal weakness, seizures, loss of consciousness, weakness and headaches.  Endo/Heme/Allergies: Negative for environmental allergies and polydipsia. Does not bruise/bleed easily.  SUBJECTIVE:  Patient is awake and alert, she as no new complaints today.   VITAL SIGNS: Temp:  [97.2 F (36.2 C)-99.3 F (37.4 C)] 98.5 F (36.9 C) (08/16 0730) Pulse Rate:  [56-135] 103 (08/16 0900) Resp:  [10-33] 18 (08/16 0900) BP: (99-184)/(67-137) 132/103 (08/16 0900) SpO2:  [86 %-100 %] 96 % (08/16 0900) Weight:  [56.2 kg (123 lb 14.4 oz)-58.8 kg (129 lb  10.1 oz)] 56.2 kg (123 lb 14.4 oz) (08/15 1651)  Physical Examination:   GENERAL:NAD, no fevers, chills, no weakness no fatigue HEENT: supple, no JVD NEURO: alert and oriented, follows commands  CARDIOVASCULAR: S1 and S2. Regular rate and rhythm. No murmurs, rubs, or gallops. No edema PULMONARY:clear throughout, even, non labored; no wheezes, rales, rhonchi, or crackles  GASTROINTESTINAL: Soft, nontender, nondistended. No masses. Positive bowel sounds.  MUSCULOSKELETAL: No swelling, clubbing, or edema. Range of motion full in all extremities. Marland Kitchen  SKIN: No ulceration, lesions, rashes, or cyanosis. Skin warm and dry.         Recent Labs Lab 08/21/16 0817 08/22/16 0442  NA 139 140  K 4.9 4.9  CL 104 102  CO2 20* 28  BUN 63* 50*  CREATININE 9.42* 7.83*  GLUCOSE 135* 94    ASSESSMENT / PLAN: Acute on chronic respiratory failure secondary to pulmonary edema Hypertensive Urgency ESRD on hemodialysis  Anemia of chronic kidney disease  P: Supplemental O2 to maintain O2 sats >92% Prn CXR  Continue losartan and metoprolol Prn labetalol and hydralazine  Continuous telemetry monitoring Continue warfarin dosing per pharmacy Nephrology consulted appreciate input-HD per recommendations  Trend CBC Monitor for s/sx of bleeding   -Pt stable for transfer out of [facility] 08/22/16 thank you for the consultation PCCM will sign off once bed available on medsurg unit. If you need further assistance please call the PCCM pager# listed in [place].  Marda Stalker, [place] Pager [phone] (please enter 7 digits) PCCM Consult Pager [phone] (please enter 7 digits)   STAFF NOTE: I. Dr. Ashby Dawes, have personally reviewed the patient's available data including medical history , events of notes, physican examination and test results as part of my evaluation. I have discussed with the  Care with the NP and  other care providers including  pharmacist, ICU RN, RRT,  dietary.  Physical Exam Lungs - good air entry bilaterally.   Acute pulmonary edema, volume overload, this is improved with urgent hemodialysis. I personally reviewed Chest x-ray which in comparison with previous  Chest x-ray shows resolved pulmonary edema.  Continue hemodialysis per nephrology  Marda Stalker, MD.   Board Certified in Internal Medicine, Pulmonary Medicine, [place], and Sleep Medicine.  [facility] Office Number: [phone] Pager: [phone]  Patricia Pesa, M.D.  Merton Border, M.D

## 2016-08-22 NOTE — Progress Notes (Signed)
[facility] for Warfarin Dosing Indication: History of DVT  Allergies  Allergen Reactions  . Gabapentin Other (See Comments)    Seizure  . Adhesive [Tape] Itching    Silk tape is ok to use.    Patient Measurements: Height: 5\' 4"  (162.6 cm) Weight: 123 lb 14.4 oz (56.2 kg) IBW/kg (Calculated) : 54.7   Vital Signs: Temp: 99 F (37.2 C) (08/16 2000) Temp Source: Oral (08/16 2000) BP: 133/82 (08/16 2000) Pulse Rate: 71 (08/16 2000) Intake/Output from previous day: 08/15 0701 - 08/16 0700 In: 12 [I.V.:12] Out: 2400  Intake/Output from this shift: No intake/output data recorded.  Labs:  Recent Labs  08/21/16 0817 08/22/16 0442  WBC 14.0* 7.9  HGB 13.9 12.1  HCT 42.8 36.1  PLT 251 165  CREATININE 9.42* 7.83*  MG  --  2.3  PHOS  --  6.8*   Estimated Creatinine Clearance: 6.4 mL/min (A) (by C-G formula based on SCr of 7.83 mg/dL (H)).   Microbiology: Recent Results (from the past 720 hour(s))  Blood culture (routine x 2)     Status: None (Preliminary result)   Collection Time: 08/21/16 10:04 AM  Result Value Ref Range Status   Specimen Description BLOOD LEFT AC  Final   Special Requests   Final    BOTTLES DRAWN AEROBIC AND ANAEROBIC Blood Culture adequate volume   Culture NO GROWTH < 24 HOURS  Final   Report Status PENDING  Incomplete  Blood culture (routine x 2)     Status: None (Preliminary result)   Collection Time: 08/21/16 10:13 AM  Result Value Ref Range Status   Specimen Description BLOOD RIGHT AC  Final   Special Requests   Final    BOTTLES DRAWN AEROBIC AND ANAEROBIC Blood Culture adequate volume   Culture  Setup Time   Final    Organism ID to follow GRAM POSITIVE COCCI AEROBIC BOTTLE ONLY CRITICAL RESULT CALLED TO, READ BACK BY AND VERIFIED WITH:  CHRISTINE     KATSOUDAS AT 4193 08/22/16 SDR GRAM STAIN REVIEWED-AGREE WITH RESULT Performed at [facility], [place] [address], [place], [place] [address]    Culture GRAM POSITIVE COCCI  Final   Report Status PENDING  Incomplete  Blood Culture ID Panel (Reflexed)     Status: Abnormal   Collection Time: 08/21/16 10:13 AM  Result Value Ref Range Status   Enterococcus species NOT DETECTED NOT DETECTED Final   Listeria monocytogenes NOT DETECTED NOT DETECTED Final   Staphylococcus species DETECTED (A) NOT DETECTED Final    Comment: Methicillin (oxacillin) resistant coagulase negative staphylococcus. Possible blood culture contaminant (unless isolated from more than one blood culture draw or clinical case suggests pathogenicity). No antibiotic treatment is indicated for blood  culture contaminants. CRITICAL RESULT CALLED TO, READ BACK BY AND VERIFIED WITH:  CHRISTINE KATSOUDAS AT 0973 08/22/16 SDR    Staphylococcus aureus NOT DETECTED NOT DETECTED Final   Methicillin resistance DETECTED (A) NOT DETECTED Final    Comment: CRITICAL RESULT CALLED TO, READ BACK BY AND VERIFIED WITH:  CHRISTINE KATSOUDAS AT 5329 08/22/16 SDR    Streptococcus species NOT DETECTED NOT DETECTED Final   Streptococcus agalactiae NOT DETECTED NOT DETECTED Final   Streptococcus pneumoniae NOT DETECTED NOT DETECTED Final   Streptococcus pyogenes NOT DETECTED NOT DETECTED Final   Acinetobacter baumannii NOT DETECTED NOT DETECTED Final   Enterobacteriaceae species NOT DETECTED NOT DETECTED Final   Enterobacter cloacae complex NOT DETECTED NOT DETECTED Final  Escherichia coli NOT DETECTED NOT DETECTED Final   Klebsiella oxytoca NOT DETECTED NOT DETECTED Final   Klebsiella pneumoniae NOT DETECTED NOT DETECTED Final   Proteus species NOT DETECTED NOT DETECTED Final   Serratia marcescens NOT DETECTED NOT DETECTED Final   Haemophilus influenzae NOT DETECTED NOT DETECTED Final   Neisseria meningitidis NOT DETECTED NOT DETECTED Final   Pseudomonas aeruginosa NOT DETECTED NOT DETECTED Final   Candida albicans NOT DETECTED NOT DETECTED Final   Candida glabrata NOT DETECTED NOT  DETECTED Final   Candida krusei NOT DETECTED NOT DETECTED Final   Candida parapsilosis NOT DETECTED NOT DETECTED Final   Candida tropicalis NOT DETECTED NOT DETECTED Final  MRSA PCR Screening     Status: None   Collection Time: 08/21/16 11:03 AM  Result Value Ref Range Status   MRSA by PCR NEGATIVE NEGATIVE Final    Comment:        The GeneXpert MRSA Assay (FDA approved for NASAL specimens only), is one component of a comprehensive MRSA colonization surveillance program. It is not intended to diagnose MRSA infection nor to guide or monitor treatment for MRSA infections.     Medications:  Scheduled:  . letrozole  2.5 mg Oral Daily  . losartan  100 mg Oral BID  . metoprolol tartrate  100 mg Oral Daily  . metoprolol tartrate  100 mg Oral Q T,Th,S,Su-1800  . pantoprazole  40 mg Oral BID  . sertraline  200 mg Oral Daily  . sevelamer carbonate  2,400 mg Oral TID WC  . warfarin  1.5 mg Oral Once per day on Mon Tue Wed Thu  . [START ON 08/23/2016] warfarin  5 mg Oral Once per day on Sun Fri Sat  . Warfarin - Pharmacist Dosing Inpatient   Does not apply q1800  . zolpidem  5 mg Oral QHS    Assessment: Patient Admitted on 8/15. INR came back therapeutic (2.38); should continue home dose.   Goal of Therapy:  Maintain INR between 2-3  Plan:  Initiate home warfarin 1.5 mg Monday, Tuesday, Wednesday, and Thursday; 5 mg Friday, Saturday, Sunday. Will obtain INR daily while hospitalized.  Jishnu Jenniges L 08/22/2016,8:22 PM

## 2016-08-22 NOTE — Progress Notes (Signed)
[place] [facility] [place] NAME: Madhuri Vacca    MR#:  [id number]  DATE OF BIRTH:  [birth year]  SUBJECTIVE:   Patient here due to shortness of breath and respiratory distress. Much improved since yesterday. Off BiPAP. Had urgent hemodialysis yesterday due to pulmonary edema and CHF. Also off IV nitroglycerin drip and blood pressure is significantly improved. Being transferred to the floor today.  REVIEW OF SYSTEMS:    Review of Systems  Constitutional: Negative for chills and fever.  HENT: Negative for congestion and tinnitus.   Eyes: Negative for blurred vision and double vision.  Respiratory: Negative for cough, shortness of breath and wheezing.   Cardiovascular: Negative for chest pain, orthopnea and PND.  Gastrointestinal: Negative for abdominal pain, diarrhea, nausea and vomiting.  Genitourinary: Negative for dysuria and hematuria.  Neurological: Negative for dizziness, sensory change and focal weakness.  All other systems reviewed and are negative.   Nutrition: Renal   Tolerating Diet: Yes Tolerating PT: Ambulatory  DRUG ALLERGIES:   Allergies  Allergen Reactions  . Gabapentin Other (See Comments)    Seizure  . Adhesive [Tape] Itching    Silk tape is ok to use.    VITALS:  Blood pressure 110/72, pulse 65, temperature 97.9 F (36.6 C), temperature source Axillary, resp. rate 20, height 5\' 4"  (1.626 m), weight 56.2 kg (123 lb 14.4 oz), SpO2 92 %.  PHYSICAL EXAMINATION:   Physical Exam  GENERAL:  61 y.o.-year-old patient lying in bed in no acute distress.  EYES: Pupils equal, round, reactive to light and accommodation. No scleral icterus. Extraocular muscles intact.  HEENT: Head atraumatic, normocephalic. Oropharynx and nasopharynx clear.  NECK:  Supple, no jugular venous distention. No thyroid enlargement, no tenderness.  LUNGS: Normal breath sounds bilaterally, no wheezing, minimal rales @ bases, No rhonchi. No use of  accessory muscles of respiration.  CARDIOVASCULAR: S1, S2 normal. No murmurs, rubs, or gallops.  ABDOMEN: Soft, nontender, nondistended. Bowel sounds present. No organomegaly or mass.  EXTREMITIES: No cyanosis, clubbing or edema b/l.    NEUROLOGIC: Cranial nerves II through XII are intact. No focal Motor or sensory deficits b/l.   PSYCHIATRIC: The patient is alert and oriented x 3.  SKIN: No obvious rash, lesion, or ulcer.    LABORATORY PANEL:   CBC  Recent Labs Lab 08/22/16 0442  WBC 7.9  HGB 12.1  HCT 36.1  PLT 165   ------------------------------------------------------------------------------------------------------------------  Chemistries   Recent Labs Lab 08/22/16 0442  NA 140  K 4.9  CL 102  CO2 28  GLUCOSE 94  BUN 50*  CREATININE 7.83*  CALCIUM 9.8  MG 2.3   ------------------------------------------------------------------------------------------------------------------  Cardiac Enzymes  Recent Labs Lab 08/21/16 0817  TROPONINI 0.04*   ------------------------------------------------------------------------------------------------------------------  RADIOLOGY:  Dg Chest Port 1 View  Result Date: 08/22/2016 CLINICAL DATA:  Pneumonia. EXAM: PORTABLE CHEST 1 VIEW COMPARISON:  08/21/2016. FINDINGS: Left IJ dialysis catheter noted with tip at the cavoatrial junction in stable position. Stable mild cardiomegaly. Interim near complete clearing of diffuse interstitial prominence suggesting clearing CHF. Small left pleural effusion. Pneumothorax P IMPRESSION: 1.  Dialysis catheter stable position. 2. Interval near complete clearing of diffuse interstitial prominence suggesting clearing CHF. Small left pleural effusion. Electronically Signed   By: Marcello Moores  Register   On: 08/22/2016 07:03   Dg Chest Port 1 View  Result Date: 08/21/2016 CLINICAL DATA:  Pneumonia.  Dialysis with atrial fibrillation. EXAM: PORTABLE CHEST 1 VIEW COMPARISON:  03/06/2016 FINDINGS: 0820  hours.  Cardiopericardial silhouette is at upper limits of normal for size. Asymmetric diffuse right central airspace disease. No substantial airspace opacity in the left lung. Left dialysis catheter remains in place. Bones are demineralized. Telemetry leads overlie the chest. IMPRESSION: Imaging features suggest diffuse right-sided pneumonia. Asymmetric edema less likely. Electronically Signed   By: Misty Stanley M.D.   On: 08/21/2016 09:05     ASSESSMENT AND PLAN:   Amreen Raczkowski  is a 61 y.o. female with a known history of stage renal disease on hemodialysis, breast cancer, CHF, previous history of DVT, atrial fibrillation, gout, anxiety who presented to the hospital due to shortness of breath.   1. Acute respiratory failure with hypoxia-this is secondary to pulmonary edema and volume overload. Patient's chest x-ray was suggestive of this on admission.  - now weaned off Bipap and on RA and doing well.  - had HD yesterday and tolerated it well.  Plan for HD again tomorrow.   2. CHF/pulmonary edema-this is acute on chronic diastolic dysfunction. - much improved w/ HD yesterday to have HD again tomorrow.  - cont. Metoprolol, Losartan  3. Accelerated/Uncontrolled HTN - blood pressure is much improved now. Off nitroglycerin drip. -Continue metoprolol, losartan, as needed hydralazine and labetalol.  4. Hx of a. Fib - rate controlled.  - cont. Metoprolol. Cont. Warfarin    5. Secondary Hyperparathyroidism - cont. Renvela  6. Depression - cont. Zoloft.    7. Anxiety - cont. Xanax.   Likely discharge home tomorrow after hemodialysis.  All the records are reviewed and case discussed with Care Management/Social Worker. Management plans discussed with the patient, family and they are in agreement.  CODE STATUS: Full code  DVT Prophylaxis: Warfarin  TOTAL TIME TAKING CARE OF THIS PATIENT: 30 minutes.   POSSIBLE D/C IN 1-2 DAYS, DEPENDING ON CLINICAL CONDITION.   [name] M.D on 08/22/2016 at 2:52 PM  Between 7am to 6pm - Pager - [phone]  After 6pm go to [email or web address] - Proofreader  [facility]  Office  [phone]  CC: Primary care physician; Murlean Iba, MD

## 2016-08-22 NOTE — Progress Notes (Signed)
PHARMACY - PHYSICIAN COMMUNICATION CRITICAL VALUE ALERT - BLOOD CULTURE IDENTIFICATION (BCID)  Results for orders placed or performed during the hospital encounter of 08/21/16  Blood Culture ID Panel (Reflexed) (Collected: 08/21/2016 10:13 AM)  Result Value Ref Range   Enterococcus species NOT DETECTED NOT DETECTED   Listeria monocytogenes NOT DETECTED NOT DETECTED   Staphylococcus species DETECTED (A) NOT DETECTED   Staphylococcus aureus NOT DETECTED NOT DETECTED   Methicillin resistance DETECTED (A) NOT DETECTED   Streptococcus species NOT DETECTED NOT DETECTED   Streptococcus agalactiae NOT DETECTED NOT DETECTED   Streptococcus pneumoniae NOT DETECTED NOT DETECTED   Streptococcus pyogenes NOT DETECTED NOT DETECTED   Acinetobacter baumannii NOT DETECTED NOT DETECTED   Enterobacteriaceae species NOT DETECTED NOT DETECTED   Enterobacter cloacae complex NOT DETECTED NOT DETECTED   Escherichia coli NOT DETECTED NOT DETECTED   Klebsiella oxytoca NOT DETECTED NOT DETECTED   Klebsiella pneumoniae NOT DETECTED NOT DETECTED   Proteus species NOT DETECTED NOT DETECTED   Serratia marcescens NOT DETECTED NOT DETECTED   Haemophilus influenzae NOT DETECTED NOT DETECTED   Neisseria meningitidis NOT DETECTED NOT DETECTED   Pseudomonas aeruginosa NOT DETECTED NOT DETECTED   Candida albicans NOT DETECTED NOT DETECTED   Candida glabrata NOT DETECTED NOT DETECTED   Candida krusei NOT DETECTED NOT DETECTED   Candida parapsilosis NOT DETECTED NOT DETECTED   Candida tropicalis NOT DETECTED NOT DETECTED    Name of physician (or Provider) Contacted: Guinevere Scarlet   Changes to prescribed antibiotics required: None; likely contaminant and will continue to monitor.  Simpson,Michael L 08/22/2016  8:19 PM

## 2016-08-22 NOTE — Progress Notes (Signed)
Pt transferred to room 213, report given to Jeannie Fend, RN assuming care of pt in room 213.  Pt is alert and oriented, no complaints of pain.  NSR/ST on telemetry, lungs clear upon auscultation.  Pt has been weaned down to room air.  Tolerating diet, pt up to bathroom and voided once.  Vital signs stable, afebrile.  Pt valuables (gown and cell phone) transported with pt to her new room.

## 2016-08-22 NOTE — Progress Notes (Signed)
Patient accepted from ICU to room 213.  Patient alert, oriented, and denies pain.  Oriented patient to room.  Call bell within reach and patient demonstrated use.

## 2016-08-23 LAB — PROTIME-INR
INR: 2.16
Prothrombin Time: 24.4 seconds — ABNORMAL HIGH (ref 11.4–15.2)

## 2016-08-23 LAB — RENAL FUNCTION PANEL
ALBUMIN: 3.7 g/dL (ref 3.5–5.0)
ANION GAP: 10 (ref 5–15)
BUN: 37 mg/dL — ABNORMAL HIGH (ref 6–20)
CALCIUM: 9.1 mg/dL (ref 8.9–10.3)
CO2: 27 mmol/L (ref 22–32)
Chloride: 99 mmol/L — ABNORMAL LOW (ref 101–111)
Creatinine, Ser: 5.16 mg/dL — ABNORMAL HIGH (ref 0.44–1.00)
GFR calc non Af Amer: 8 mL/min — ABNORMAL LOW (ref >60)
GFR, EST AFRICAN AMERICAN: 9 mL/min — AB (ref >60)
Glucose, Bld: 95 mg/dL (ref 65–99)
PHOSPHORUS: 2.9 mg/dL (ref 2.5–4.6)
Potassium: 4.1 mmol/L (ref 3.5–5.1)
SODIUM: 136 mmol/L (ref 135–145)

## 2016-08-23 LAB — CBC
HCT: 34.4 % — ABNORMAL LOW (ref 35.0–47.0)
HEMOGLOBIN: 11.3 g/dL — AB (ref 12.0–16.0)
MCH: 29.2 pg (ref 26.0–34.0)
MCHC: 32.9 g/dL (ref 32.0–36.0)
MCV: 88.9 fL (ref 80.0–100.0)
Platelets: 168 10*3/uL (ref 150–440)
RBC: 3.87 MIL/uL (ref 3.80–5.20)
RDW: 15.6 % — ABNORMAL HIGH (ref 11.5–14.5)
WBC: 7 10*3/uL (ref 3.6–11.0)

## 2016-08-23 LAB — HEPATITIS B SURFACE ANTIGEN: Hepatitis B Surface Ag: NEGATIVE

## 2016-08-23 MED ORDER — TRAMADOL HCL 50 MG PO TABS
50.0000 mg | ORAL_TABLET | Freq: Four times a day (QID) | ORAL | 0 refills | Status: DC | PRN
Start: 2016-08-23 — End: 2017-02-11

## 2016-08-23 NOTE — Care Management (Addendum)
Teresa Cook, dialysis liaison notified of discharge. Faxed DC summary to her as well.

## 2016-08-23 NOTE — Progress Notes (Signed)
Pre hd info 

## 2016-08-23 NOTE — Progress Notes (Signed)
[facility]  ROUNDING NOTE   Subjective:   Seen and examined on hemodialysis treatment. UF of 2 liters    HEMODIALYSIS FLOWSHEET:  Blood Flow Rate (mL/min): 350 mL/min Arterial Pressure (mmHg): -150 mmHg Venous Pressure (mmHg): 190 mmHg Transmembrane Pressure (mmHg): 70 mmHg Ultrafiltration Rate (mL/min): 830 mL/min Dialysate Flow Rate (mL/min): 600 ml/min Conductivity: Machine : 13.9 Conductivity: Machine : 13.9 Dialysis Fluid Bolus: Normal Saline Bolus Amount (mL): 250 mL (prime) Dialysate Change:  (3k)   Objective:  Vital signs in last 24 hours:  Temp:  [97.9 F (36.6 C)-99 F (37.2 C)] 98.8 F (37.1 C) (08/17 1024) Pulse Rate:  [65-89] 83 (08/17 1030) Resp:  [14-27] 18 (08/17 1030) BP: (110-156)/(72-101) 155/101 (08/17 1030) SpO2:  [91 %-100 %] 100 % (08/17 1030) Weight:  [57.7 kg (127 lb 3.3 oz)] 57.7 kg (127 lb 3.3 oz) (08/17 1024)  Weight change:  Filed Weights   08/21/16 1334 08/21/16 1651 08/23/16 1024  Weight: 58.8 kg (129 lb 10.1 oz) 56.2 kg (123 lb 14.4 oz) 57.7 kg (127 lb 3.3 oz)    Intake/Output: I/O last 3 completed shifts: In: 92 [P.O.:420] Out: 0    Intake/Output this shift:  Total I/O In: 120 [P.O.:120] Out: -   Physical Exam: General: NAD  Head: [place]/AT  Eyes: Anicteric, PERRL  Neck: Supple, trachea midline  Lungs:  Clear bilaterally  Heart: irregular  Abdomen:  Soft, nontender  Extremities: no peripheral edema.  Neurologic: Nonfocal, moving all four extremities  Skin: No lesions  Access: Left IJ permcath    Basic Metabolic Panel:  Recent Labs Lab 08/21/16 0817 08/22/16 0442  NA 139 140  K 4.9 4.9  CL 104 102  CO2 20* 28  GLUCOSE 135* 94  BUN 63* 50*  CREATININE 9.42* 7.83*  CALCIUM 9.9 9.8  MG  --  2.3  PHOS  --  6.8*    Liver Function Tests: No results for input(s): AST, ALT, ALKPHOS, BILITOT, PROT, ALBUMIN in the last 168 hours. No results for input(s): LIPASE, AMYLASE in the last 168 hours. No  results for input(s): AMMONIA in the last 168 hours.  CBC:  Recent Labs Lab 08/21/16 0817 08/22/16 0442  WBC 14.0* 7.9  NEUTROABS 8.3*  --   HGB 13.9 12.1  HCT 42.8 36.1  MCV 88.5 88.4  PLT 251 165    Cardiac Enzymes:  Recent Labs Lab 08/21/16 0817  TROPONINI 0.04*    BNP: Invalid input(s): POCBNP  CBG:  Recent Labs Lab 08/21/16 1100  GLUCAP 98    Microbiology: Results for orders placed or performed during the hospital encounter of 08/21/16  Blood culture (routine x 2)     Status: None (Preliminary result)   Collection Time: 08/21/16 10:04 AM  Result Value Ref Range Status   Specimen Description BLOOD LEFT AC  Final   Special Requests   Final    BOTTLES DRAWN AEROBIC AND ANAEROBIC Blood Culture adequate volume   Culture NO GROWTH 2 DAYS  Final   Report Status PENDING  Incomplete  Blood culture (routine x 2)     Status: None (Preliminary result)   Collection Time: 08/21/16 10:13 AM  Result Value Ref Range Status   Specimen Description BLOOD RIGHT AC  Final   Special Requests   Final    BOTTLES DRAWN AEROBIC AND ANAEROBIC Blood Culture adequate volume   Culture  Setup Time   Final    GRAM POSITIVE COCCI AEROBIC BOTTLE ONLY CRITICAL RESULT CALLED TO, READ BACK  BY AND VERIFIED WITH:  CHRISTINE     KATSOUDAS AT 0814 08/22/16 SDR GRAM STAIN REVIEWED-AGREE WITH RESULT    Culture   Final    GRAM POSITIVE COCCI IDENTIFICATION TO FOLLOW Performed at [facility], [place] [address] [place], [place] [address]    Report Status PENDING  Incomplete  Blood Culture ID Panel (Reflexed)     Status: Abnormal   Collection Time: 08/21/16 10:13 AM  Result Value Ref Range Status   Enterococcus species NOT DETECTED NOT DETECTED Final   Listeria monocytogenes NOT DETECTED NOT DETECTED Final   Staphylococcus species DETECTED (A) NOT DETECTED Final    Comment: Methicillin (oxacillin) resistant coagulase negative staphylococcus. Possible blood culture contaminant (unless  isolated from more than one blood culture draw or clinical case suggests pathogenicity). No antibiotic treatment is indicated for blood  culture contaminants. CRITICAL RESULT CALLED TO, READ BACK BY AND VERIFIED WITH:  CHRISTINE KATSOUDAS AT 6314 08/22/16 SDR    Staphylococcus aureus NOT DETECTED NOT DETECTED Final   Methicillin resistance DETECTED (A) NOT DETECTED Final    Comment: CRITICAL RESULT CALLED TO, READ BACK BY AND VERIFIED WITH:  CHRISTINE KATSOUDAS AT 9702 08/22/16 SDR    Streptococcus species NOT DETECTED NOT DETECTED Final   Streptococcus agalactiae NOT DETECTED NOT DETECTED Final   Streptococcus pneumoniae NOT DETECTED NOT DETECTED Final   Streptococcus pyogenes NOT DETECTED NOT DETECTED Final   Acinetobacter baumannii NOT DETECTED NOT DETECTED Final   Enterobacteriaceae species NOT DETECTED NOT DETECTED Final   Enterobacter cloacae complex NOT DETECTED NOT DETECTED Final   Escherichia coli NOT DETECTED NOT DETECTED Final   Klebsiella oxytoca NOT DETECTED NOT DETECTED Final   Klebsiella pneumoniae NOT DETECTED NOT DETECTED Final   Proteus species NOT DETECTED NOT DETECTED Final   Serratia marcescens NOT DETECTED NOT DETECTED Final   Haemophilus influenzae NOT DETECTED NOT DETECTED Final   Neisseria meningitidis NOT DETECTED NOT DETECTED Final   Pseudomonas aeruginosa NOT DETECTED NOT DETECTED Final   Candida albicans NOT DETECTED NOT DETECTED Final   Candida glabrata NOT DETECTED NOT DETECTED Final   Candida krusei NOT DETECTED NOT DETECTED Final   Candida parapsilosis NOT DETECTED NOT DETECTED Final   Candida tropicalis NOT DETECTED NOT DETECTED Final  MRSA PCR Screening     Status: None   Collection Time: 08/21/16 11:03 AM  Result Value Ref Range Status   MRSA by PCR NEGATIVE NEGATIVE Final    Comment:        The GeneXpert MRSA Assay (FDA approved for NASAL specimens only), is one component of a comprehensive MRSA colonization surveillance program. It is  not intended to diagnose MRSA infection nor to guide or monitor treatment for MRSA infections.     Coagulation Studies:  Recent Labs  08/21/16 0817 08/22/16 0442 08/23/16 0346  LABPROT 26.4* 27.4* 24.4*  INR 2.38 2.49 2.16    Urinalysis: No results for input(s): COLORURINE, LABSPEC, PHURINE, GLUCOSEU, HGBUR, BILIRUBINUR, KETONESUR, PROTEINUR, UROBILINOGEN, NITRITE, LEUKOCYTESUR in the last 72 hours.  Invalid input(s): APPERANCEUR    Imaging: Dg Chest Port 1 View  Result Date: 08/22/2016 CLINICAL DATA:  Pneumonia. EXAM: PORTABLE CHEST 1 VIEW COMPARISON:  08/21/2016. FINDINGS: Left IJ dialysis catheter noted with tip at the cavoatrial junction in stable position. Stable mild cardiomegaly. Interim near complete clearing of diffuse interstitial prominence suggesting clearing CHF. Small left pleural effusion. Pneumothorax P IMPRESSION: 1.  Dialysis catheter stable position. 2. Interval near complete clearing of diffuse interstitial prominence suggesting clearing CHF.  Small left pleural effusion. Electronically Signed   By: Marcello Moores  Register   On: 08/22/2016 07:03     Medications:    . letrozole  2.5 mg Oral Daily  . losartan  100 mg Oral BID  . metoprolol tartrate  100 mg Oral Daily  . metoprolol tartrate  100 mg Oral Q T,Th,S,Su-1800  . pantoprazole  40 mg Oral BID  . sertraline  200 mg Oral Daily  . sevelamer carbonate  2,400 mg Oral TID WC  . warfarin  1.5 mg Oral Once per day on Mon Tue Wed Thu  . warfarin  5 mg Oral Once per day on Sun Fri Sat  . Warfarin - Pharmacist Dosing Inpatient   Does not apply q1800  . zolpidem  5 mg Oral QHS   acetaminophen **OR** acetaminophen, hydrALAZINE, labetalol, morphine injection, ondansetron **OR** ondansetron (ZOFRAN) IV, traMADol  Assessment/ Plan:  [name] is a 61 y.o. black female with ESRD on hemodialysis, breast cancer status post mastectomy, congestive heart failure, depression, DVT, seizure disorder, anemia,  sickle cell trait, hypertension, gout, atrial fibrillation  [place]. MWF  1. End Stage Renal Disease: urgent dialysis for flash pulmonary edema and respiratory failure requiring noninvasive ventilation on admission.  - Seen and examined on hemodialysis. Tolerating treatment well.  - Continue MWF schedule.   2. Acute Respiratory Failure: on BIPAP. With flash pulmonary edema on admission. Echo from 01/6107 with systolic and diastolic dysfunction. EF 40-45%. Tricuspid valve moderate regurgitation.  Resolved. Breathing room air.   3. Hypertension: with urgency on admission - Continue losartan and metoprolol   4. Anemia of chronic kidney disease: hemoglobin 12.1. Holding epo.   5. Secondary Hyperparathyroidism: PTH 410, phos 7.5 and calcium 9.6 outpatient on 8/13.  - sevelamer 3 tabs with meals.     LOS: 2 Jaxsun Ciampi 8/17/201810:59 AM

## 2016-08-23 NOTE — Progress Notes (Signed)
Post hd vitals 

## 2016-08-23 NOTE — Progress Notes (Signed)
Hd start, pt alert, no c/o, stable

## 2016-08-23 NOTE — Progress Notes (Signed)
Pre hd assessment  

## 2016-08-23 NOTE — Progress Notes (Signed)
   End of hd, alert, no c/o, stable, report to primary rn

## 2016-08-23 NOTE — Discharge Summary (Signed)
[place] [facility] [place] NAME: Teresa Cook    MR#:  [id number]  DATE OF BIRTH:  07-Oct-1955  DATE OF ADMISSION:  08/21/2016 ADMITTING PHYSICIAN: [name], MD  DATE OF DISCHARGE: 08/23/2016  PRIMARY CARE PHYSICIAN: Murlean Iba, MD    ADMISSION DIAGNOSIS:  End stage renal disease ([place]) [N18.6] Acute pulmonary edema (H[facility]) [J81.0] Respiratory distress [R06.03]  DISCHARGE DIAGNOSIS:  Active Problems:   Acute respiratory failure with hypoxia ([place])   SECONDARY DIAGNOSIS:   Past Medical History:  Diagnosis Date  . Anemia   . Anxiety   . Atrial fibrillation ([place])   . Breast cancer ([place]) 01/2016   bilateral  . CHF (congestive heart failure) ([place])   . Chronic kidney disease   . Depression   . Dialysis patient ([place])   . DVT (deep venous thrombosis) (H[facility])    left leg  . DVT (deep venous thrombosis) ([place]) 1985   right thigh  . Dysrhythmia   . Gout   . Headache   . HTN (hypertension)   . Hypertension   . Parathyroid abnormality ([place])   . Parathyroid disease ([place])   . Pneumonia 12/2015  . Psoriasis   . Renal insufficiency   . Sickle cell trait ([place])    traits    HOSPITAL COURSE:   Teresa Cook a 61 y.o. femalewith a known history of stage renal disease on hemodialysis, breast cancer, CHF, previous history of DVT, atrial fibrillation, gout, anxiety who presented to the hospital due to shortness of breath.   1. Acute respiratory failure with hypoxia-this was secondary to pulmonary edema and volume overload. Patient's chest x-ray was suggestive of this on admission.  - patient was initially admitted to the intensive care unit on BiPAP.patient received urgent hemodialysis and was eventually weaned off the BiPAP. She is no longer hypoxic and stable and therefore being discharged home.  2. CHF/pulmonary edema-this was acute on chronic diastolic dysfunction. -patient received urgent hemodialysis and her volume status has  improved. She is no longer in congestive heart failure. - She will continue on metoprolol, losartan. She will resume her dialysis on a Monday Wednesday Friday schedule.  3. Accelerated/Uncontrolled HTN - initially patient was admitted to the hospital and placed on a nitroglycerin drip.She was quickly weaned off the drip. -She will continue her metoprolol, losartan upon discharge. Her blood pressures have significantly improved after hemodialysis.  4. Hx of a. Fib - rate controlled.  - She will cont. Metoprolol. She will Cont. Warfarin    5. Secondary Hyperparathyroidism - she will cont. Renvela  6. Depression - she will cont. Zoloft.   7. Anxiety - she will cont. Xanax.   8. History of breast cancer- She will continue Femara  DISCHARGE CONDITIONS:   Stable.   CONSULTS OBTAINED:  Treatment Team:  Lavonia Dana, MD Flora Lipps, MD  DRUG ALLERGIES:   Allergies  Allergen Reactions  . Gabapentin Other (See Comments)    Seizure  . Adhesive [Tape] Itching    Silk tape is ok to use.    DISCHARGE MEDICATIONS:   Allergies as of 08/23/2016      Reactions   Gabapentin Other (See Comments)   Seizure   Adhesive [tape] Itching   Silk tape is ok to use.      Medication List    STOP taking these medications   ALPRAZolam 0.5 MG tablet Commonly known as:  XANAX   amLODipine 10 MG tablet Commonly known as:  NORVASC  TAKE these medications   acetaminophen 325 MG tablet Commonly known as:  TYLENOL Take 2 tablets (650 mg total) by mouth every 6 (six) hours as needed for mild pain (or Fever >/= 101).   cinacalcet 60 MG tablet Commonly known as:  SENSIPAR Take 60 mg by mouth 3 (three) times daily with meals.   feeding supplement (ENSURE ENLIVE) Liqd Take 237 mLs by mouth 3 (three) times daily between meals. What changed:  when to take this   letrozole 2.5 MG tablet Commonly known as:  FEMARA Take 1 tablet (2.5 mg total) by mouth daily.   losartan 100 MG  tablet Commonly known as:  COZAAR Take 100 mg by mouth 2 (two) times daily.   metoprolol tartrate 100 MG tablet Commonly known as:  LOPRESSOR Take 0.5 tablets (50 mg total) by mouth 2 (two) times daily. Takes on non-dialysis days=Tuesday, Thursday, Saturday and Sunday. What changed:  how much to take  additional instructions   ondansetron 8 MG tablet Commonly known as:  ZOFRAN Take by mouth every 8 (eight) hours as needed for nausea or vomiting.   pantoprazole 40 MG tablet Commonly known as:  PROTONIX Take 1 tablet (40 mg total) by mouth daily. What changed:  when to take this   prochlorperazine 10 MG tablet Commonly known as:  COMPAZINE Take 10 mg by mouth every 6 (six) hours as needed for nausea or vomiting.   sertraline 50 MG tablet Commonly known as:  ZOLOFT Take 3 tablets (150 mg total) by mouth daily. What changed:  how much to take   sevelamer carbonate 800 MG tablet Commonly known as:  RENVELA Take 2,400 mg by mouth 3 (three) times daily with meals. Tid with meals and with 1 snack   traMADol 50 MG tablet Commonly known as:  ULTRAM Take 1 tablet (50 mg total) by mouth every 6 (six) hours as needed for moderate pain.   UNABLE TO FIND Med Name: Prosthetic Bra with Inserts   warfarin 5 MG tablet Commonly known as:  COUMADIN take 5 mg on friday, saturday, sunday. and 1.5 mg on monday, tuesday, wednesday, thursday   zolpidem 5 MG tablet Commonly known as:  AMBIEN Take 1 tablet (5 mg total) by mouth at bedtime.         DISCHARGE INSTRUCTIONS:   DIET:  Cardiac diet and Renal diet  DISCHARGE CONDITION:  Stable  ACTIVITY:  Activity as tolerated  OXYGEN:  Home Oxygen: No.   Oxygen Delivery: room air  DISCHARGE LOCATION:  home   If you experience worsening of your admission symptoms, develop shortness of breath, life threatening emergency, suicidal or homicidal thoughts you must seek medical attention immediately by calling 911 or calling your MD  immediately  if symptoms less severe.  You Must read complete instructions/literature along with all the possible adverse reactions/side effects for all the Medicines you take and that have been prescribed to you. Take any new Medicines after you have completely understood and accpet all the possible adverse reactions/side effects.   Please note  You were cared for by a hospitalist during your hospital stay. If you have any questions about your discharge medications or the care you received while you were in the hospital after you are discharged, you can call the unit and asked to speak with the hospitalist on call if the hospitalist that took care of you is not available. Once you are discharged, your primary care physician will handle any further medical issues. Please note that NO  REFILLS for any discharge medications will be authorized once you are discharged, as it is imperative that you return to your primary care physician (or establish a relationship with a primary care physician if you do not have one) for your aftercare needs so that they can reassess your need for medications and monitor your lab values.     Today   Pt. Seen at HD earlier and doing well. BP stable.  NO shortness of breath.  No other complaints.    VITAL SIGNS:  Blood pressure 132/81, pulse 89, temperature 98.1 F (36.7 C), temperature source Oral, resp. rate 16, height 5\' 4"  (1.626 m), weight 57.7 kg (127 lb 3.3 oz), SpO2 100 %.  I/O:   Intake/Output Summary (Last 24 hours) at 08/23/16 1404 Last data filed at 08/23/16 1334  Gross per 24 hour  Intake              360 ml  Output             2000 ml  Net            -1640 ml    PHYSICAL EXAMINATION:   GENERAL:  61 y.o.-year-old patient lying in bed in no acute distress.  EYES: Pupils equal, round, reactive to light and accommodation. No scleral icterus. Extraocular muscles intact.  HEENT: Head atraumatic, normocephalic. Oropharynx and nasopharynx clear.   NECK:  Supple, no jugular venous distention. No thyroid enlargement, no tenderness.  LUNGS: Normal breath sounds bilaterally, no wheezing, minimal rales at bases b/l, No rhonchi. No use of accessory muscles of respiration.  CARDIOVASCULAR: S1, S2 normal. No murmurs, rubs, or gallops.  ABDOMEN: Soft, nontender, nondistended. Bowel sounds present. No organomegaly or mass.  EXTREMITIES: No cyanosis, clubbing or edema b/l.    NEUROLOGIC: Cranial nerves II through XII are intact. No focal Motor or sensory deficits b/l.   PSYCHIATRIC: The patient is alert and oriented x 3.  SKIN: No obvious rash, lesion, or ulcer.   DATA REVIEW:   CBC  Recent Labs Lab 08/23/16 1100  WBC 7.0  HGB 11.3*  HCT 34.4*  PLT 168    Chemistries   Recent Labs Lab 08/22/16 0442 08/23/16 1100  NA 140 136  K 4.9 4.1  CL 102 99*  CO2 28 27  GLUCOSE 94 95  BUN 50* 37*  CREATININE 7.83* 5.16*  CALCIUM 9.8 9.1  MG 2.3  --     Cardiac Enzymes  Recent Labs Lab 08/21/16 0817  TROPONINI 0.04*    Microbiology Results  Results for orders placed or performed during the hospital encounter of 08/21/16  Blood culture (routine x 2)     Status: None (Preliminary result)   Collection Time: 08/21/16 10:04 AM  Result Value Ref Range Status   Specimen Description BLOOD LEFT AC  Final   Special Requests   Final    BOTTLES DRAWN AEROBIC AND ANAEROBIC Blood Culture adequate volume   Culture NO GROWTH 2 DAYS  Final   Report Status PENDING  Incomplete  Blood culture (routine x 2)     Status: Abnormal (Preliminary result)   Collection Time: 08/21/16 10:13 AM  Result Value Ref Range Status   Specimen Description BLOOD RIGHT AC  Final   Special Requests   Final    BOTTLES DRAWN AEROBIC AND ANAEROBIC Blood Culture adequate volume   Culture  Setup Time   Final    GRAM POSITIVE COCCI AEROBIC BOTTLE ONLY CRITICAL RESULT CALLED TO, READ BACK BY AND VERIFIED WITH:  CHRISTINE     KATSOUDAS AT 8338 08/22/16 SDR GRAM  STAIN REVIEWED-AGREE WITH RESULT    Culture (A)  Final    STAPHYLOCOCCUS SPECIES (COAGULASE NEGATIVE) THE SIGNIFICANCE OF ISOLATING THIS ORGANISM FROM A SINGLE SET OF BLOOD CULTURES WHEN MULTIPLE SETS ARE DRAWN IS UNCERTAIN. PLEASE NOTIFY THE MICROBIOLOGY DEPARTMENT WITHIN ONE WEEK IF SPECIATION AND SENSITIVITIES ARE REQUIRED. Performed at [facility], [place] [address], [place], [place] [address]    Report Status PENDING  Incomplete  Blood Culture ID Panel (Reflexed)     Status: Abnormal   Collection Time: 08/21/16 10:13 AM  Result Value Ref Range Status   Enterococcus species NOT DETECTED NOT DETECTED Final   Listeria monocytogenes NOT DETECTED NOT DETECTED Final   Staphylococcus species DETECTED (A) NOT DETECTED Final    Comment: Methicillin (oxacillin) resistant coagulase negative staphylococcus. Possible blood culture contaminant (unless isolated from more than one blood culture draw or clinical case suggests pathogenicity). No antibiotic treatment is indicated for blood  culture contaminants. CRITICAL RESULT CALLED TO, READ BACK BY AND VERIFIED WITH:  CHRISTINE KATSOUDAS AT 9767 08/22/16 SDR    Staphylococcus aureus NOT DETECTED NOT DETECTED Final   Methicillin resistance DETECTED (A) NOT DETECTED Final    Comment: CRITICAL RESULT CALLED TO, READ BACK BY AND VERIFIED WITH:  CHRISTINE KATSOUDAS AT 3419 08/22/16 SDR    Streptococcus species NOT DETECTED NOT DETECTED Final   Streptococcus agalactiae NOT DETECTED NOT DETECTED Final   Streptococcus pneumoniae NOT DETECTED NOT DETECTED Final   Streptococcus pyogenes NOT DETECTED NOT DETECTED Final   Acinetobacter baumannii NOT DETECTED NOT DETECTED Final   Enterobacteriaceae species NOT DETECTED NOT DETECTED Final   Enterobacter cloacae complex NOT DETECTED NOT DETECTED Final   Escherichia coli NOT DETECTED NOT DETECTED Final   Klebsiella oxytoca NOT DETECTED NOT DETECTED Final   Klebsiella pneumoniae NOT DETECTED NOT DETECTED  Final   Proteus species NOT DETECTED NOT DETECTED Final   Serratia marcescens NOT DETECTED NOT DETECTED Final   Haemophilus influenzae NOT DETECTED NOT DETECTED Final   Neisseria meningitidis NOT DETECTED NOT DETECTED Final   Pseudomonas aeruginosa NOT DETECTED NOT DETECTED Final   Candida albicans NOT DETECTED NOT DETECTED Final   Candida glabrata NOT DETECTED NOT DETECTED Final   Candida krusei NOT DETECTED NOT DETECTED Final   Candida parapsilosis NOT DETECTED NOT DETECTED Final   Candida tropicalis NOT DETECTED NOT DETECTED Final  MRSA PCR Screening     Status: None   Collection Time: 08/21/16 11:03 AM  Result Value Ref Range Status   MRSA by PCR NEGATIVE NEGATIVE Final    Comment:        The GeneXpert MRSA Assay (FDA approved for NASAL specimens only), is one component of a comprehensive MRSA colonization surveillance program. It is not intended to diagnose MRSA infection nor to guide or monitor treatment for MRSA infections.     RADIOLOGY:  Dg Chest Port 1 View  Result Date: 08/22/2016 CLINICAL DATA:  Pneumonia. EXAM: PORTABLE CHEST 1 VIEW COMPARISON:  08/21/2016. FINDINGS: Left IJ dialysis catheter noted with tip at the cavoatrial junction in stable position. Stable mild cardiomegaly. Interim near complete clearing of diffuse interstitial prominence suggesting clearing CHF. Small left pleural effusion. Pneumothorax P IMPRESSION: 1.  Dialysis catheter stable position. 2. Interval near complete clearing of diffuse interstitial prominence suggesting clearing CHF. Small left pleural effusion. Electronically Signed   By: Marcello Moores  Register   On: 08/22/2016 07:03      Management plans discussed  with the patient, family and they are in agreement.  CODE STATUS:     Code Status Orders        Start     Ordered   08/21/16 1103  Full code  Continuous     08/21/16 1102    Advance Directive Documentation     Most Recent Value  Type of Advance Directive  Healthcare Power of  Attorney  Pre-existing out of facility DNR order (yellow form or pink MOST form)  -  "MOST" Form in Place?  -      TOTAL TIME TAKING CARE OF THIS PATIENT: 40 minutes.    [name] M.D on 08/23/2016 at 2:04 PM  Between 7am to 6pm - Pager - [phone]  After 6pm go to [email or web address] - Proofreader  [facility]  Office  [phone]  CC: Primary care physician; Murlean Iba, MD

## 2016-08-23 NOTE — Progress Notes (Signed)
[place] for Warfarin dosing Indication: hx DVT/Afib  Allergies  Allergen Reactions  . Gabapentin Other (See Comments)    Seizure  . Adhesive [Tape] Itching    Silk tape is ok to use.    Patient Measurements: Height: 5\' 4"  (162.6 cm) Weight: 123 lb 14.4 oz (56.2 kg) IBW/kg (Calculated) : 54.7 Heparin Dosing Weight:    Vital Signs: Temp: 98.4 F (36.9 C) (08/17 0417) Temp Source: Oral (08/17 0417) BP: 118/75 (08/17 0417) Pulse Rate: 71 (08/17 0417)  Labs:  Recent Labs  08/21/16 0817 08/22/16 0442 08/23/16 0346  HGB 13.9 12.1  --   HCT 42.8 36.1  --   PLT 251 165  --   LABPROT 26.4* 27.4* 24.4*  INR 2.38 2.49 2.16  CREATININE 9.42* 7.83*  --   TROPONINI 0.04*  --   --     Estimated Creatinine Clearance: 6.4 mL/min (A) (by C-G formula based on SCr of 7.83 mg/dL (H)).   Medical History: Past Medical History:  Diagnosis Date  . Anemia   . Anxiety   . Atrial fibrillation ([place])   . Breast cancer ([place]) 01/2016   bilateral  . CHF (congestive heart failure) ([place])   . Chronic kidney disease   . Depression   . Dialysis patient ([place])   . DVT (deep venous thrombosis) ([facility])    left leg  . DVT (deep venous thrombosis) ([place]) 1985   right thigh  . Dysrhythmia   . Gout   . Headache   . HTN (hypertension)   . Hypertension   . Parathyroid abnormality ([place])   . Parathyroid disease ([place])   . Pneumonia 12/2015  . Psoriasis   . Renal insufficiency   . Sickle cell trait ([facility])    traits    Medications:  Scheduled:  . letrozole  2.5 mg Oral Daily  . losartan  100 mg Oral BID  . metoprolol tartrate  100 mg Oral Daily  . metoprolol tartrate  100 mg Oral Q T,Th,S,Su-1800  . pantoprazole  40 mg Oral BID  . sertraline  200 mg Oral Daily  . sevelamer carbonate  2,400 mg Oral TID WC  . warfarin  1.5 mg Oral Once per day on Mon Tue Wed Thu  . warfarin  5 mg Oral Once per day on Sun Fri Sat  . Warfarin - Pharmacist Dosing  Inpatient   Does not apply q1800  . zolpidem  5 mg Oral QHS    Assessment: Patient Admitted on 8/15. INR came back therapeutic (2.38); should continue home dose.  Hx Afib and DVT  8/15 INR  2.38   Warfarin  1.5 mg 8/16 INR  2.49    Warfarin 1.5 mg 8/17 INR  2.16   Goal of Therapy:  Maintain INR between 2-3  Plan:  Continue home dose of warfarin 1.5 mg Monday, Tuesday, Wednesday, and Thursday; Warfarin 5 mg Friday, Saturday, Sunday.  Will obtain INR daily while hospitalized.  Randen Kauth A 08/23/2016,7:33 AM

## 2016-08-24 LAB — HEPATITIS B SURFACE ANTIGEN: Hepatitis B Surface Ag: NEGATIVE

## 2016-08-24 LAB — CULTURE, BLOOD (ROUTINE X 2): SPECIAL REQUESTS: ADEQUATE

## 2016-08-26 LAB — CULTURE, BLOOD (ROUTINE X 2)
Culture: NO GROWTH
Special Requests: ADEQUATE

## 2016-09-09 ENCOUNTER — Encounter: Payer: Self-pay | Admitting: Emergency Medicine

## 2016-09-09 ENCOUNTER — Emergency Department: Payer: Medicare Other

## 2016-09-09 ENCOUNTER — Inpatient Hospital Stay
Admission: EM | Admit: 2016-09-09 | Discharge: 2016-09-14 | DRG: 189 | Disposition: A | Discharge status: Home / Self Care | Payer: Medicare Other | Attending: Internal Medicine | Admitting: Internal Medicine

## 2016-09-09 DIAGNOSIS — Z79899 Other long term (current) drug therapy: Secondary | ICD-10-CM | POA: Diagnosis not present

## 2016-09-09 DIAGNOSIS — Z853 Personal history of malignant neoplasm of breast: Secondary | ICD-10-CM | POA: Diagnosis not present

## 2016-09-09 DIAGNOSIS — R7989 Other specified abnormal findings of blood chemistry: Secondary | ICD-10-CM

## 2016-09-09 DIAGNOSIS — D573 Sickle-cell trait: Secondary | ICD-10-CM | POA: Diagnosis present

## 2016-09-09 DIAGNOSIS — N186 End stage renal disease: Secondary | ICD-10-CM

## 2016-09-09 DIAGNOSIS — L8990 Pressure ulcer of unspecified site, unspecified stage: Secondary | ICD-10-CM | POA: Insufficient documentation

## 2016-09-09 DIAGNOSIS — I5043 Acute on chronic combined systolic (congestive) and diastolic (congestive) heart failure: Secondary | ICD-10-CM | POA: Diagnosis not present

## 2016-09-09 DIAGNOSIS — I472 Ventricular tachycardia: Secondary | ICD-10-CM | POA: Diagnosis present

## 2016-09-09 DIAGNOSIS — R778 Other specified abnormalities of plasma proteins: Secondary | ICD-10-CM

## 2016-09-09 DIAGNOSIS — Z992 Dependence on renal dialysis: Secondary | ICD-10-CM

## 2016-09-09 DIAGNOSIS — I429 Cardiomyopathy, unspecified: Secondary | ICD-10-CM | POA: Diagnosis present

## 2016-09-09 DIAGNOSIS — D631 Anemia in chronic kidney disease: Secondary | ICD-10-CM | POA: Diagnosis present

## 2016-09-09 DIAGNOSIS — J181 Lobar pneumonia, unspecified organism: Secondary | ICD-10-CM | POA: Diagnosis not present

## 2016-09-09 DIAGNOSIS — I160 Hypertensive urgency: Secondary | ICD-10-CM | POA: Diagnosis present

## 2016-09-09 DIAGNOSIS — N2581 Secondary hyperparathyroidism of renal origin: Secondary | ICD-10-CM | POA: Diagnosis present

## 2016-09-09 DIAGNOSIS — I161 Hypertensive emergency: Secondary | ICD-10-CM

## 2016-09-09 DIAGNOSIS — Z8249 Family history of ischemic heart disease and other diseases of the circulatory system: Secondary | ICD-10-CM | POA: Diagnosis not present

## 2016-09-09 DIAGNOSIS — R0902 Hypoxemia: Secondary | ICD-10-CM

## 2016-09-09 DIAGNOSIS — R51 Headache: Secondary | ICD-10-CM | POA: Diagnosis present

## 2016-09-09 DIAGNOSIS — I959 Hypotension, unspecified: Secondary | ICD-10-CM | POA: Diagnosis not present

## 2016-09-09 DIAGNOSIS — R748 Abnormal levels of other serum enzymes: Secondary | ICD-10-CM | POA: Diagnosis not present

## 2016-09-09 DIAGNOSIS — L8992 Pressure ulcer of unspecified site, stage 2: Secondary | ICD-10-CM | POA: Diagnosis present

## 2016-09-09 DIAGNOSIS — I5023 Acute on chronic systolic (congestive) heart failure: Secondary | ICD-10-CM | POA: Diagnosis present

## 2016-09-09 DIAGNOSIS — Z91048 Other nonmedicinal substance allergy status: Secondary | ICD-10-CM

## 2016-09-09 DIAGNOSIS — Z888 Allergy status to other drugs, medicaments and biological substances status: Secondary | ICD-10-CM

## 2016-09-09 DIAGNOSIS — T8241XA Breakdown (mechanical) of vascular dialysis catheter, initial encounter: Secondary | ICD-10-CM | POA: Diagnosis not present

## 2016-09-09 DIAGNOSIS — Y95 Nosocomial condition: Secondary | ICD-10-CM | POA: Diagnosis present

## 2016-09-09 DIAGNOSIS — I5021 Acute systolic (congestive) heart failure: Secondary | ICD-10-CM | POA: Diagnosis not present

## 2016-09-09 DIAGNOSIS — Z7901 Long term (current) use of anticoagulants: Secondary | ICD-10-CM | POA: Diagnosis not present

## 2016-09-09 DIAGNOSIS — E875 Hyperkalemia: Secondary | ICD-10-CM | POA: Diagnosis present

## 2016-09-09 DIAGNOSIS — I428 Other cardiomyopathies: Secondary | ICD-10-CM

## 2016-09-09 DIAGNOSIS — J810 Acute pulmonary edema: Secondary | ICD-10-CM | POA: Diagnosis present

## 2016-09-09 DIAGNOSIS — I248 Other forms of acute ischemic heart disease: Secondary | ICD-10-CM | POA: Diagnosis present

## 2016-09-09 DIAGNOSIS — R0602 Shortness of breath: Secondary | ICD-10-CM

## 2016-09-09 DIAGNOSIS — I10 Essential (primary) hypertension: Secondary | ICD-10-CM

## 2016-09-09 DIAGNOSIS — J189 Pneumonia, unspecified organism: Secondary | ICD-10-CM

## 2016-09-09 DIAGNOSIS — J9601 Acute respiratory failure with hypoxia: Secondary | ICD-10-CM | POA: Diagnosis not present

## 2016-09-09 DIAGNOSIS — Z9013 Acquired absence of bilateral breasts and nipples: Secondary | ICD-10-CM

## 2016-09-09 DIAGNOSIS — J811 Chronic pulmonary edema: Secondary | ICD-10-CM | POA: Diagnosis present

## 2016-09-09 DIAGNOSIS — I5033 Acute on chronic diastolic (congestive) heart failure: Secondary | ICD-10-CM | POA: Diagnosis present

## 2016-09-09 DIAGNOSIS — Z86718 Personal history of other venous thrombosis and embolism: Secondary | ICD-10-CM | POA: Diagnosis not present

## 2016-09-09 DIAGNOSIS — R9431 Abnormal electrocardiogram [ECG] [EKG]: Secondary | ICD-10-CM | POA: Diagnosis not present

## 2016-09-09 DIAGNOSIS — I132 Hypertensive heart and chronic kidney disease with heart failure and with stage 5 chronic kidney disease, or end stage renal disease: Secondary | ICD-10-CM | POA: Diagnosis present

## 2016-09-09 LAB — COMPREHENSIVE METABOLIC PANEL
ALBUMIN: 4 g/dL (ref 3.5–5.0)
ALT: 28 U/L (ref 14–54)
AST: 35 U/L (ref 15–41)
Alkaline Phosphatase: 98 U/L (ref 38–126)
Anion gap: 14 (ref 5–15)
BILIRUBIN TOTAL: 0.8 mg/dL (ref 0.3–1.2)
BUN: 72 mg/dL — AB (ref 6–20)
CO2: 20 mmol/L — ABNORMAL LOW (ref 22–32)
CREATININE: 11.13 mg/dL — AB (ref 0.44–1.00)
Calcium: 9.5 mg/dL (ref 8.9–10.3)
Chloride: 107 mmol/L (ref 101–111)
GFR calc Af Amer: 4 mL/min — ABNORMAL LOW (ref >60)
GFR, EST NON AFRICAN AMERICAN: 3 mL/min — AB (ref >60)
GLUCOSE: 112 mg/dL — AB (ref 65–99)
POTASSIUM: 6.1 mmol/L — AB (ref 3.5–5.1)
Sodium: 141 mmol/L (ref 135–145)
TOTAL PROTEIN: 8 g/dL (ref 6.5–8.1)

## 2016-09-09 LAB — BLOOD GAS, ARTERIAL
ACID-BASE DEFICIT: 8 mmol/L — AB (ref 0.0–2.0)
Acid-base deficit: 9.5 mmol/L — ABNORMAL HIGH (ref 0.0–2.0)
BICARBONATE: 17.7 mmol/L — AB (ref 20.0–28.0)
Bicarbonate: 16.4 mmol/L — ABNORMAL LOW (ref 20.0–28.0)
DELIVERY SYSTEMS: POSITIVE
Delivery systems: POSITIVE
EXPIRATORY PAP: 5
Expiratory PAP: 5
FIO2: 0.7
FIO2: 40
INSPIRATORY PAP: 12
Inspiratory PAP: 12
O2 SAT: 91.5 %
O2 Saturation: 98 %
PATIENT TEMPERATURE: 37
PCO2 ART: 35 mmHg (ref 32.0–48.0)
PH ART: 7.28 — AB (ref 7.350–7.450)
PH ART: 7.3 — AB (ref 7.350–7.450)
PO2 ART: 117 mmHg — AB (ref 83.0–108.0)
Patient temperature: 37
pCO2 arterial: 36 mmHg (ref 32.0–48.0)
pO2, Arterial: 69 mmHg — ABNORMAL LOW (ref 83.0–108.0)

## 2016-09-09 LAB — CBC
HEMATOCRIT: 39.9 % (ref 35.0–47.0)
HEMOGLOBIN: 13.1 g/dL (ref 12.0–16.0)
MCH: 29 pg (ref 26.0–34.0)
MCHC: 32.8 g/dL (ref 32.0–36.0)
MCV: 88.4 fL (ref 80.0–100.0)
PLATELETS: 288 10*3/uL (ref 150–440)
RBC: 4.52 MIL/uL (ref 3.80–5.20)
RDW: 17.2 % — AB (ref 11.5–14.5)
WBC: 12.9 10*3/uL — AB (ref 3.6–11.0)

## 2016-09-09 LAB — LACTIC ACID, PLASMA: LACTIC ACID, VENOUS: 1.3 mmol/L (ref 0.5–1.9)

## 2016-09-09 LAB — PHOSPHORUS: Phosphorus: 7.5 mg/dL — ABNORMAL HIGH (ref 2.5–4.6)

## 2016-09-09 LAB — GLUCOSE, CAPILLARY: GLUCOSE-CAPILLARY: 101 mg/dL — AB (ref 65–99)

## 2016-09-09 LAB — PROTIME-INR
INR: 1.81
Prothrombin Time: 20.8 seconds — ABNORMAL HIGH (ref 11.4–15.2)

## 2016-09-09 LAB — TROPONIN I
TROPONIN I: 0.05 ng/mL — AB (ref <0.03)
Troponin I: 0.04 ng/mL (ref <0.03)
Troponin I: 0.09 ng/mL (ref <0.03)

## 2016-09-09 MED ORDER — ACETAMINOPHEN 650 MG RE SUPP: 650.0000 mg | Freq: Four times a day (QID) | RECTAL | Status: DC | PRN

## 2016-09-09 MED ORDER — ENSURE ENLIVE PO LIQD
237.0000 mL | Freq: Every day | ORAL | Status: DC
  Administered 2016-09-10 – 2016-09-14 (×4): 237 mL via ORAL

## 2016-09-09 MED ORDER — METOPROLOL TARTRATE 50 MG PO TABS
50.0000 mg | ORAL_TABLET | ORAL | Status: DC
  Administered 2016-09-10 (×2): 50 mg via ORAL
  Filled 2016-09-09 (×3): qty 1

## 2016-09-09 MED ORDER — OXYCODONE-ACETAMINOPHEN 5-325 MG PO TABS
1.0000 | ORAL_TABLET | ORAL | Status: AC
  Administered 2016-09-09: 1 via ORAL
  Filled 2016-09-09: qty 1

## 2016-09-09 MED ORDER — SENNOSIDES-DOCUSATE SODIUM 8.6-50 MG PO TABS: 1.0000 | ORAL_TABLET | Freq: Every evening | ORAL | Status: DC | PRN

## 2016-09-09 MED ORDER — ZOLPIDEM TARTRATE 5 MG PO TABS
5.0000 mg | ORAL_TABLET | Freq: Every day | ORAL | Status: DC
  Administered 2016-09-09 – 2016-09-13 (×5): 5 mg via ORAL
  Filled 2016-09-09 (×5): qty 1

## 2016-09-09 MED ORDER — SODIUM CHLORIDE 0.9 % IV SOLN: 250.0000 mL | INTRAVENOUS | Status: DC | PRN

## 2016-09-09 MED ORDER — SODIUM CHLORIDE 0.9% FLUSH
3.0000 mL | Freq: Two times a day (BID) | INTRAVENOUS | Status: DC
  Administered 2016-09-09 – 2016-09-13 (×8): 3 mL via INTRAVENOUS

## 2016-09-09 MED ORDER — METOPROLOL TARTRATE 50 MG PO TABS
100.0000 mg | ORAL_TABLET | ORAL | Status: DC
  Administered 2016-09-11: 100 mg via ORAL
  Filled 2016-09-09: qty 2

## 2016-09-09 MED ORDER — FUROSEMIDE 10 MG/ML IJ SOLN
120.0000 mg | Freq: Once | INTRAVENOUS | Status: AC
  Administered 2016-09-09: 120 mg via INTRAVENOUS
  Filled 2016-09-09: qty 12

## 2016-09-09 MED ORDER — FUROSEMIDE 10 MG/ML IJ SOLN
60.0000 mg | Freq: Two times a day (BID) | INTRAMUSCULAR | Status: DC
  Administered 2016-09-09 – 2016-09-11 (×4): 60 mg via INTRAVENOUS
  Filled 2016-09-09: qty 6
  Filled 2016-09-09 (×2): qty 8
  Filled 2016-09-09 (×2): qty 6

## 2016-09-09 MED ORDER — SERTRALINE HCL 50 MG PO TABS
150.0000 mg | ORAL_TABLET | Freq: Every day | ORAL | Status: DC
  Administered 2016-09-09 – 2016-09-14 (×6): 150 mg via ORAL
  Filled 2016-09-09 (×6): qty 3

## 2016-09-09 MED ORDER — SODIUM CHLORIDE 0.9% FLUSH: 3.0000 mL | INTRAVENOUS | Status: DC | PRN

## 2016-09-09 MED ORDER — MORPHINE SULFATE (PF) 2 MG/ML IV SOLN
1.0000 mg | Freq: Once | INTRAVENOUS | Status: AC
  Administered 2016-09-09: 1 mg via INTRAVENOUS

## 2016-09-09 MED ORDER — ACETAMINOPHEN 325 MG PO TABS
650.0000 mg | ORAL_TABLET | Freq: Four times a day (QID) | ORAL | Status: DC | PRN
  Administered 2016-09-09 – 2016-09-11 (×2): 650 mg via ORAL
  Filled 2016-09-09 (×2): qty 2

## 2016-09-09 MED ORDER — SEVELAMER CARBONATE 800 MG PO TABS
2400.0000 mg | ORAL_TABLET | Freq: Three times a day (TID) | ORAL | Status: DC
  Administered 2016-09-09 – 2016-09-14 (×10): 2400 mg via ORAL
  Filled 2016-09-09 (×10): qty 3

## 2016-09-09 MED ORDER — ONDANSETRON HCL 4 MG/2ML IJ SOLN
4.0000 mg | Freq: Four times a day (QID) | INTRAMUSCULAR | Status: DC | PRN
  Administered 2016-09-09 – 2016-09-13 (×3): 4 mg via INTRAVENOUS
  Filled 2016-09-09 (×3): qty 2

## 2016-09-09 MED ORDER — WARFARIN SODIUM 5 MG PO TABS
5.0000 mg | ORAL_TABLET | Freq: Once | ORAL | Status: AC
  Administered 2016-09-09: 5 mg via ORAL
  Filled 2016-09-09: qty 1

## 2016-09-09 MED ORDER — WARFARIN - PHYSICIAN DOSING INPATIENT
Freq: Every day | Status: DC
  Administered 2016-09-09: 18:00:00

## 2016-09-09 MED ORDER — NICARDIPINE HCL IN NACL 20-0.86 MG/200ML-% IV SOLN
5.0000 mg/h | INTRAVENOUS | Status: DC
  Administered 2016-09-09: 5 mg/h via INTRAVENOUS
  Administered 2016-09-10: 1 mg/h via INTRAVENOUS
  Filled 2016-09-09 (×2): qty 200

## 2016-09-09 MED ORDER — HEPARIN SODIUM (PORCINE) 5000 UNIT/ML IJ SOLN
5000.0000 [IU] | Freq: Three times a day (TID) | INTRAMUSCULAR | Status: DC
  Administered 2016-09-09 – 2016-09-14 (×15): 5000 [IU] via SUBCUTANEOUS
  Filled 2016-09-09 (×15): qty 1

## 2016-09-09 MED ORDER — TRAMADOL HCL 50 MG PO TABS
50.0000 mg | ORAL_TABLET | Freq: Two times a day (BID) | ORAL | Status: DC | PRN
  Administered 2016-09-10 – 2016-09-13 (×5): 50 mg via ORAL
  Filled 2016-09-09 (×6): qty 1

## 2016-09-09 MED ORDER — LOSARTAN POTASSIUM 50 MG PO TABS
100.0000 mg | ORAL_TABLET | Freq: Two times a day (BID) | ORAL | Status: DC
  Administered 2016-09-09 – 2016-09-10 (×3): 100 mg via ORAL
  Filled 2016-09-09 (×3): qty 2

## 2016-09-09 MED ORDER — VANCOMYCIN HCL IN DEXTROSE 1-5 GM/200ML-% IV SOLN
1000.0000 mg | Freq: Once | INTRAVENOUS | Status: DC
  Filled 2016-09-09: qty 200

## 2016-09-09 MED ORDER — PANTOPRAZOLE SODIUM 40 MG PO TBEC
40.0000 mg | DELAYED_RELEASE_TABLET | Freq: Every day | ORAL | Status: DC
  Administered 2016-09-09 – 2016-09-14 (×6): 40 mg via ORAL
  Filled 2016-09-09 (×6): qty 1

## 2016-09-09 MED ORDER — PIPERACILLIN-TAZOBACTAM 3.375 G IVPB
3.3750 g | Freq: Two times a day (BID) | INTRAVENOUS | Status: DC
  Administered 2016-09-09 – 2016-09-10 (×2): 3.375 g via INTRAVENOUS
  Filled 2016-09-09 (×3): qty 50

## 2016-09-09 MED ORDER — MORPHINE SULFATE (PF) 2 MG/ML IV SOLN
INTRAVENOUS | Status: AC
  Administered 2016-09-09: 1 mg via INTRAVENOUS
  Filled 2016-09-09: qty 1

## 2016-09-09 MED ORDER — ONDANSETRON HCL 4 MG PO TABS
4.0000 mg | ORAL_TABLET | Freq: Four times a day (QID) | ORAL | Status: DC | PRN
  Administered 2016-09-12: 4 mg via ORAL
  Filled 2016-09-09: qty 1

## 2016-09-09 MED ORDER — NITROGLYCERIN IN D5W 200-5 MCG/ML-% IV SOLN
0.0000 ug/min | Freq: Once | INTRAVENOUS | Status: AC
  Administered 2016-09-09: 5 ug/min via INTRAVENOUS
  Filled 2016-09-09: qty 250

## 2016-09-09 MED ORDER — ALBUMIN HUMAN 25 % IV SOLN
12.5000 g | Freq: Once | INTRAVENOUS | Status: AC
  Administered 2016-09-09: 12.5 g via INTRAVENOUS
  Filled 2016-09-09: qty 50

## 2016-09-09 MED ORDER — PIPERACILLIN-TAZOBACTAM 3.375 G IVPB 30 MIN
3.3750 g | Freq: Once | INTRAVENOUS | Status: AC
  Administered 2016-09-09: 3.375 g via INTRAVENOUS
  Filled 2016-09-09 (×2): qty 50

## 2016-09-09 MED ORDER — VANCOMYCIN HCL 500 MG IV SOLR
500.0000 mg | INTRAVENOUS | Status: DC
  Administered 2016-09-09: 500 mg via INTRAVENOUS
  Filled 2016-09-09: qty 500

## 2016-09-09 MED ORDER — CINACALCET HCL 30 MG PO TABS
60.0000 mg | ORAL_TABLET | Freq: Three times a day (TID) | ORAL | Status: DC
  Administered 2016-09-09 – 2016-09-14 (×7): 60 mg via ORAL
  Filled 2016-09-09 (×18): qty 2

## 2016-09-09 NOTE — Progress Notes (Signed)
[facility]  ROUNDING NOTE   Subjective:  Patient well known to [place] from prior admission. She was just discharged on 08/23/2016 for pulmonary edema. She now presents with significant shortness of breath and very high blood pressure. She was with her family over the weekend. Unclear at the moment however patient may have had excess fluid and salt intake. She is currently maintained on BiPAP and on a nitroglycerin drip. Patient also complaining of a headache at the moment. Due for dialysis today. Chest x-ray shows multifocal opacities could represent either pneumonia or asymmetric pulmonary edema.  Objective:  Vital signs in last 24 hours:  Pulse Rate:  [82-99] 82 (09/03 0815) Resp:  [28-37] 31 (09/03 0815) BP: (187-203)/(130-168) 203/130 (09/03 0815) SpO2:  [92 %-100 %] 92 % (09/03 0815) FiO2 (%):  [40 %] 40 % (09/03 0815) Weight:  [57.7 kg (127 lb 3.3 oz)-57.8 kg (127 lb 8 oz)] 57.7 kg (127 lb 3.3 oz) (09/03 0815)  Weight change:  Filed Weights   09/09/16 0617 09/09/16 0815  Weight: 57.8 kg (127 lb 8 oz) 57.7 kg (127 lb 3.3 oz)    Intake/Output: No intake/output data recorded.   Intake/Output this shift:  No intake/output data recorded.  Physical Exam: General: Critically ill appearing   Head: [place]/AT, Hearing intact   Eyes: Anicteric  Neck: Supple, trachea midline  Lungs:  Bilateral rales, on BiPAP   Heart: S1S2 no rubs  Abdomen:  Soft, nontender  Extremities: trace peripheral edema.  Neurologic: Nonfocal, moving all four extremities  Skin: No lesions  Access: Left IJ permcath    Basic Metabolic Panel:  Recent Labs Lab 09/09/16 0619  NA 141  K 6.1*  CL 107  CO2 20*  GLUCOSE 112*  BUN 72*  CREATININE 11.13*  CALCIUM 9.5    Liver Function Tests:  Recent Labs Lab 09/09/16 0619  AST 35  ALT 28  ALKPHOS 98  BILITOT 0.8  PROT 8.0  ALBUMIN 4.0   No results for input(s): LIPASE, AMYLASE in the last 168 hours. No results for input(s):  AMMONIA in the last 168 hours.  CBC:  Recent Labs Lab 09/09/16 0620  WBC 12.9*  HGB 13.1  HCT 39.9  MCV 88.4  PLT 288    Cardiac Enzymes:  Recent Labs Lab 09/09/16 0620  TROPONINI 0.04*    BNP: Invalid input(s): POCBNP  CBG:  Recent Labs Lab 09/09/16 0805  GLUCAP 101*    Microbiology: Results for orders placed or performed during the hospital encounter of 08/21/16  Blood culture (routine x 2)     Status: None   Collection Time: 08/21/16 10:04 AM  Result Value Ref Range Status   Specimen Description BLOOD LEFT AC  Final   Special Requests   Final    BOTTLES DRAWN AEROBIC AND ANAEROBIC Blood Culture adequate volume   Culture NO GROWTH 5 DAYS  Final   Report Status 08/26/2016 FINAL  Final  Blood culture (routine x 2)     Status: Abnormal   Collection Time: 08/21/16 10:13 AM  Result Value Ref Range Status   Specimen Description BLOOD RIGHT AC  Final   Special Requests   Final    BOTTLES DRAWN AEROBIC AND ANAEROBIC Blood Culture adequate volume   Culture  Setup Time   Final    GRAM POSITIVE COCCI AEROBIC BOTTLE ONLY CRITICAL RESULT CALLED TO, READ BACK BY AND VERIFIED WITH:  CHRISTINE     KATSOUDAS AT [id number] 08/22/16 [place] WITH RESULT  Culture (A)  Final    STAPHYLOCOCCUS SPECIES (COAGULASE NEGATIVE) THE SIGNIFICANCE OF ISOLATING THIS ORGANISM FROM A SINGLE SET OF BLOOD CULTURES WHEN MULTIPLE SETS ARE DRAWN IS UNCERTAIN. PLEASE NOTIFY THE MICROBIOLOGY DEPARTMENT WITHIN ONE WEEK IF SPECIATION AND SENSITIVITIES ARE REQUIRED. Performed at [facility], [place] [address] [place], [place] [address]    Report Status 08/24/2016 FINAL  Final  Blood Culture ID Panel (Reflexed)     Status: Abnormal   Collection Time: 08/21/16 10:13 AM  Result Value Ref Range Status   Enterococcus species NOT DETECTED NOT DETECTED Final   Listeria monocytogenes NOT DETECTED NOT DETECTED Final   Staphylococcus species DETECTED (A) NOT DETECTED Final     Comment: Methicillin (oxacillin) resistant coagulase negative staphylococcus. Possible blood culture contaminant (unless isolated from more than one blood culture draw or clinical case suggests pathogenicity). No antibiotic treatment is indicated for blood  culture contaminants. CRITICAL RESULT CALLED TO, READ BACK BY AND VERIFIED WITH:  CHRISTINE KATSOUDAS AT [id number] 08/22/16 SDR    Staphylococcus aureus NOT DETECTED NOT DETECTED Final   Methicillin resistance DETECTED (A) NOT DETECTED Final    Comment: CRITICAL RESULT CALLED TO, READ BACK BY AND VERIFIED WITH:  CHRISTINE KATSOUDAS AT 0865 08/22/16 SDR    Streptococcus species NOT DETECTED NOT DETECTED Final   Streptococcus agalactiae NOT DETECTED NOT DETECTED Final   Streptococcus pneumoniae NOT DETECTED NOT DETECTED Final   Streptococcus pyogenes NOT DETECTED NOT DETECTED Final   Acinetobacter baumannii NOT DETECTED NOT DETECTED Final   Enterobacteriaceae species NOT DETECTED NOT DETECTED Final   Enterobacter cloacae complex NOT DETECTED NOT DETECTED Final   Escherichia coli NOT DETECTED NOT DETECTED Final   Klebsiella oxytoca NOT DETECTED NOT DETECTED Final   Klebsiella pneumoniae NOT DETECTED NOT DETECTED Final   Proteus species NOT DETECTED NOT DETECTED Final   Serratia marcescens NOT DETECTED NOT DETECTED Final   Haemophilus influenzae NOT DETECTED NOT DETECTED Final   Neisseria meningitidis NOT DETECTED NOT DETECTED Final   Pseudomonas aeruginosa NOT DETECTED NOT DETECTED Final   Candida albicans NOT DETECTED NOT DETECTED Final   Candida glabrata NOT DETECTED NOT DETECTED Final   Candida krusei NOT DETECTED NOT DETECTED Final   Candida parapsilosis NOT DETECTED NOT DETECTED Final   Candida tropicalis NOT DETECTED NOT DETECTED Final  MRSA PCR Screening     Status: None   Collection Time: 08/21/16 11:03 AM  Result Value Ref Range Status   MRSA by PCR NEGATIVE NEGATIVE Final    Comment:        The GeneXpert MRSA Assay  (FDA approved for NASAL specimens only), is one component of a comprehensive MRSA colonization surveillance program. It is not intended to diagnose MRSA infection nor to guide or monitor treatment for MRSA infections.     Coagulation Studies:  Recent Labs  09/09/16 0620  LABPROT 20.8*  INR 1.81    Urinalysis: No results for input(s): COLORURINE, LABSPEC, PHURINE, GLUCOSEU, HGBUR, BILIRUBINUR, KETONESUR, PROTEINUR, UROBILINOGEN, NITRITE, LEUKOCYTESUR in the last 72 hours.  Invalid input(s): APPERANCEUR    Imaging: Dg Chest Portable 1 View  Result Date: 09/09/2016 CLINICAL DATA:  Acute dyspnea EXAM: PORTABLE CHEST 1 VIEW COMPARISON:  08/22/2016 FINDINGS: Dual-lumen left jugular central line extends into the SVC. Multifocal bilateral airspace opacity involving the central lung regions bilaterally, with more confluent dense consolidation in the right upper lobe. No large effusions. Hilar contours are obscured. Unchanged borderline cardiomegaly. IMPRESSION: New multifocal airspace opacities, greatest in the right upper lobe  where there is confluent consolidation. This may represent pneumonia. Asymmetric alveolar edema is also a possibility. Electronically Signed   By: Andreas Newport M.D.   On: 09/09/2016 06:42     Medications:   . sodium chloride    . niCARDipine    . piperacillin-tazobactam (ZOSYN)  IV    . vancomycin    . vancomycin     . cinacalcet  60 mg Oral TID WC  . feeding supplement (ENSURE ENLIVE)  237 mL Oral Daily  . furosemide  60 mg Intravenous Q12H  . heparin  5,000 Units Subcutaneous Q8H  . losartan  100 mg Oral BID  . metoprolol tartrate  100 mg Oral Once per day on Mon Wed Fri  . [START ON 09/10/2016] metoprolol tartrate  50 mg Oral 2 times per day on Sun Tue Thu Sat  . pantoprazole  40 mg Oral Daily  . sertraline  150 mg Oral Daily  . sevelamer carbonate  2,400 mg Oral TID WC  . sodium chloride flush  3 mL Intravenous Q12H  . warfarin  5 mg Oral  ONCE-1800  . Warfarin - Physician Dosing Inpatient   Does not apply q1800  . zolpidem  5 mg Oral QHS   sodium chloride, acetaminophen **OR** acetaminophen, ondansetron **OR** ondansetron (ZOFRAN) IV, senna-docusate, sodium chloride flush  Assessment/ Plan:  [name] is a 61 y.o. black female with ESRD on hemodialysis, breast cancer status post mastectomy, congestive heart failure, depression, DVT, seizure disorder, anemia, sickle cell trait, hypertension, gout, atrial fibrillation  [place]. MWF  1. End Stage Renal Disease: Patient needs urgent dialysis at the moment. She is currently on BiPAP. Ultrafiltration target 3-3.5 kg.  2. Acute Respiratory Failure: on BIPAP. With pulmonary edema vs pneumonia vs both.  Shortness of breath was rather sudden as described by family. Chest x-ray reveals multifocal opacities which could represent pneumonia however asymmetric pulmonary edema is also a consideration. We will plan for dialysis at the moment. Okay to continue vancomycin and Zosyn for now.  3. Hypertension: We will discontinue nitroglycerin drip and transition the patient to nicardipine as the patient is having a headache at the moment. Can use when necessary sublingual nitroglycerin for chest pain.   4. Anemia of chronic kidney disease: Hemoglobin 13.1. Hold Epogen at this time.  5. Secondary Hyperparathyroidism: Check PTH and phosphorus. Continue Renvela.   LOS: 0 Jaydan Chretien 9/3/20189:03 AM

## 2016-09-09 NOTE — ED Notes (Signed)
Pt placed on BiPap

## 2016-09-09 NOTE — H&P (Signed)
[facility] NAME: Teresa Cook    MR#:  [id number]  DATE OF BIRTH:  1955-12-06  DATE OF ADMISSION:  09/09/2016  PRIMARY CARE PHYSICIAN: Murlean Iba, MD   REQUESTING/REFERRING PHYSICIAN:   CHIEF COMPLAINT:   Chief Complaint  Patient presents with  . Shortness of Breath    HISTORY OF PRESENT ILLNESS: Teresa Cook  is a 61 y.o. female with a known history of end-stage renal disease on dialysis, atrial fibrillation, congestive heart failure, DVT left leg, gout, hypertension currently on Coumadin for anticoagulation presented to the emergency room with shortness of breath. Shortness of breath started early this morning and patient was unable to breathe. Her O2 saturation was 78% on nonrebreather mask when she was brought by the EMS. Patient was put on BiPAP in the emergency room and stabilized. Oxygen saturation improved after being put on BiPAP for respiratory distress. Patient was evaluated with chest x-ray which showed a volume overload with consolidation. Patient feels weak and tired. Potassium was elevated. Patient did not miss any dialysis. Nephrology was consulted by ER physician for dialysis. Patient was given one 20 mg IV Lasix in the emergency room.  PAST MEDICAL HISTORY:   Past Medical History:  Diagnosis Date  . Anemia   . Anxiety   . Atrial fibrillation ([place])   . Breast cancer ([place]) 01/2016   bilateral  . CHF (congestive heart failure) ([place])   . Chronic kidney disease   . Depression   . Dialysis patient ([place])   . DVT (deep venous thrombosis) ([facility])    left leg  . DVT (deep venous thrombosis) ([place]) 1985   right thigh  . Dysrhythmia   . Gout   . Headache   . HTN (hypertension)   . Hypertension   . Parathyroid abnormality ([place])   . Parathyroid disease ([place])   . Pneumonia 12/2015  . Psoriasis   . Renal insufficiency   . Sickle cell trait ([place])    traits    PAST SURGICAL HISTORY: Past Surgical  History:  Procedure Laterality Date  . ABDOMINAL HYSTERECTOMY  1980  . APPENDECTOMY    . BREAST BIOPSY Left 10/28/2013   benign  . BREAST EXCISIONAL BIOPSY Left 2002   benign  . INSERTION OF DIALYSIS CATHETER  2014  . LIPOMA EXCISION N/A 01/23/2016   Procedure: EXCISION LIPOMA;  Surgeon: Hubbard Robinson, MD;  Location: [facility]S;  Service: General;  Laterality: N/A;  . MASTECTOMY W/ SENTINEL NODE BIOPSY Bilateral 01/23/2016   Procedure: bilateral MASTECTOMY WITH  bilateral SENTINEL LYMPH NODE BIOPSY possible left axillary node dissection forehead lipoma removal;  Surgeon: Hubbard Robinson, MD;  Location: [facility];  Service: General;  Laterality: Bilateral;  . PARTIAL HYSTERECTOMY    . PERIPHERAL VASCULAR CATHETERIZATION N/A 12/25/2015   Procedure: Dialysis/Perma Catheter Insertion;  Surgeon: Algernon Huxley, MD;  Location: [facility];  Service: Cardiovascular;  Laterality: N/A;  . PERIPHERAL VASCULAR CATHETERIZATION Left 01/22/2016   Procedure: Dialysis/Perma Catheter Insertion;  Surgeon: Algernon Huxley, MD;  Location: [facility];  Service: Cardiovascular;  Laterality: Left;  . PERIPHERAL VASCULAR CATHETERIZATION N/A 01/26/2016   Procedure: Dialysis/Perma Catheter Insertion;  Surgeon: Katha Cabal, MD;  Location: [facility];  Service: Cardiovascular;  Laterality: N/A;  . PORT-A-CATH REMOVAL N/A 12/20/2015   Procedure: REMOVAL PORT-A-CATH;  Surgeon: Hubbard Robinson, MD;  Location: [facility];  Service: General;  Laterality: N/A;  left    .  PORTACATH PLACEMENT Left 08/21/2015   Procedure: INSERTION PORT-A-CATH;  Surgeon: Hubbard Robinson, MD;  Location: [facility];  Service: General;  Laterality: Left;  . REMOVAL OF A DIALYSIS CATHETER  2017    SOCIAL HISTORY:  Social History  Substance Use Topics  . Smoking status: Never Smoker  . Smokeless tobacco: Never Used  . Alcohol use No    FAMILY HISTORY:  Family History  Problem Relation Age of Onset  .  Stroke Mother   . CVA Mother   . Hypertension Mother   . Hypertension Father   . Hypertension Sister   . Diabetes Sister   . Cancer Sister 49       Breast  . Stroke Brother   . Hypertension Brother   . Breast cancer Maternal Aunt 70    DRUG ALLERGIES:  Allergies  Allergen Reactions  . Gabapentin Other (See Comments)    Seizure  . Adhesive [Tape] Itching    Silk tape is ok to use.    REVIEW OF SYSTEMS:   CONSTITUTIONAL: No fever, has weakness.  EYES: No blurred or double vision.  EARS, NOSE, AND THROAT: No tinnitus or ear pain.  RESPIRATORY:Has cough, shortness of breath,  No wheezing or hemoptysis.  CARDIOVASCULAR: No chest pain, orthopnea, edema.  GASTROINTESTINAL: Has nausea, vomiting, No diarrhea or abdominal pain.  GENITOURINARY: No dysuria, hematuria.  ENDOCRINE: No polyuria, nocturia,  HEMATOLOGY: No anemia, easy bruising or bleeding SKIN: No rash or lesion. MUSCULOSKELETAL: No joint pain or arthritis.   NEUROLOGIC: No tingling, numbness, weakness.  PSYCHIATRY: No anxiety or depression.   MEDICATIONS AT HOME:  Prior to Admission medications   Medication Sig Start Date End Date Taking? Authorizing Provider  acetaminophen (TYLENOL) 325 MG tablet Take 2 tablets (650 mg total) by mouth every 6 (six) hours as needed for mild pain (or Fever >/= 101). 12/27/15   [name], MD  cinacalcet (SENSIPAR) 60 MG tablet Take 60 mg by mouth 3 (three) times daily with meals.    [provider]  feeding supplement, ENSURE ENLIVE, (ENSURE ENLIVE) LIQD Take 237 mLs by mouth 3 (three) times daily between meals. Patient taking differently: Take 237 mLs by mouth daily.  12/27/15   [name], MD  letrozole (FEMARA) 2.5 MG tablet Take 1 tablet (2.5 mg total) by mouth daily. 05/07/16   [name], MD  losartan (COZAAR) 100 MG tablet Take 100 mg by mouth 2 (two) times daily.  09/07/14   [provider]  metoprolol (LOPRESSOR) 100 MG tablet Take 0.5 tablets (50  mg total) by mouth 2 (two) times daily. Takes on non-dialysis days=Tuesday, Thursday, Saturday and Sunday. Patient taking differently: Take 100 mg by mouth 2 (two) times daily. Takes 100 MG DAILY on non-dialysis days= Monday, Wednesday, Friday 12/27/15   [name], MD  ondansetron (ZOFRAN) 8 MG tablet Take by mouth every 8 (eight) hours as needed for nausea or vomiting.    [provider]  pantoprazole (PROTONIX) 40 MG tablet Take 1 tablet (40 mg total) by mouth daily. Patient taking differently: Take 40 mg by mouth 2 (two) times daily.  03/01/16   [name], MD  prochlorperazine (COMPAZINE) 10 MG tablet Take 10 mg by mouth every 6 (six) hours as needed for nausea or vomiting.    [provider]  sertraline (ZOLOFT) 50 MG tablet Take 3 tablets (150 mg total) by mouth daily. Patient taking differently: Take 200 mg by mouth daily.  03/01/16   [name], MD  sevelamer carbonate (RENVELA) 800 MG tablet Take 2,400 mg by mouth 3 (three) times daily with meals. Tid with meals and with 1 snack     [provider]  traMADol (ULTRAM) 50 MG tablet Take 1 tablet (50 mg total) by mouth every 6 (six) hours as needed for moderate pain. 08/23/16   [name], MD  UNABLE TO FIND Med Name: Prosthetic Bra with Inserts 04/23/16   [name], MD  warfarin (COUMADIN) 5 MG tablet take 5 mg on friday, saturday, sunday. and 1.5 mg on monday, tuesday, wednesday, thursday 11/03/13   [provider]  zolpidem (AMBIEN) 5 MG tablet Take 1 tablet (5 mg total) by mouth at bedtime. 03/01/16   [name], MD      PHYSICAL EXAMINATION:   VITAL SIGNS: Blood pressure (!) 199/140, pulse 85, resp. rate (!) 30, weight 57.8 kg (127 lb 8 oz), SpO2 100 %.  GENERAL:  61 y.o.-year-old patient lying in respiratory distress on bipap EYES: Pupils equal, round, reactive to light and accommodation. No scleral icterus. Extraocular muscles intact.  HEENT: Head  atraumatic, normocephalic. Oropharynx and nasopharynx clear.  NECK:  Supple, no jugular venous distention. No thyroid enlargement, no tenderness.  LUNGS: Decreased breath sounds bilaterally, bibasilar crepitations heard, rales heard in right lung.  Dialysis catheter over chest wall CARDIOVASCULAR: S1, S2 normal. No murmurs, rubs, or gallops.  ABDOMEN: Soft, nontender, nondistended. Bowel sounds present. No organomegaly or mass.  EXTREMITIES: No pedal edema, cyanosis, or clubbing.  NEUROLOGIC: Cranial nerves II through XII are intact. Muscle strength 5/5 in all extremities. Sensation intact. Gait not checked.  PSYCHIATRIC: The patient is alert and oriented x 3.  SKIN: No obvious rash, lesion, or ulcer.   LABORATORY PANEL:   CBC  Recent Labs Lab 09/09/16 0620  WBC 12.9*  HGB 13.1  HCT 39.9  PLT 288  MCV 88.4  MCH 29.0  MCHC 32.8  RDW 17.2*   ------------------------------------------------------------------------------------------------------------------  Chemistries   Recent Labs Lab 09/09/16 0619  NA 141  K 6.1*  CL 107  CO2 20*  GLUCOSE 112*  BUN 72*  CREATININE 11.13*  CALCIUM 9.5  AST 35  ALT 28  ALKPHOS 98  BILITOT 0.8   ------------------------------------------------------------------------------------------------------------------ estimated creatinine clearance is 4.5 mL/min (A) (by C-G formula based on SCr of 11.13 mg/dL (H)). ------------------------------------------------------------------------------------------------------------------ No results for input(s): TSH, T4TOTAL, T3FREE, THYROIDAB in the last 72 hours.  Invalid input(s): FREET3   Coagulation profile No results for input(s): INR, PROTIME in the last 168 hours. ------------------------------------------------------------------------------------------------------------------- No results for input(s): DDIMER in the last 72  hours. -------------------------------------------------------------------------------------------------------------------  Cardiac Enzymes No results for input(s): CKMB, TROPONINI, MYOGLOBIN in the last 168 hours.  Invalid input(s): CK ------------------------------------------------------------------------------------------------------------------ Invalid input(s): POCBNP  ---------------------------------------------------------------------------------------------------------------  Urinalysis    Component Value Date/Time   COLORURINE STRAW (A) 03/06/2016 1128   APPEARANCEUR CLEAR (A) 03/06/2016 1128   APPEARANCEUR Clear 09/08/2013 0133   LABSPEC 1.003 (L) 03/06/2016 1128   LABSPEC 1.013 09/08/2013 0133   PHURINE 8.0 03/06/2016 1128   GLUCOSEU NEGATIVE 03/06/2016 1128   GLUCOSEU 50 mg/dL 09/08/2013 0133   HGBUR NEGATIVE 03/06/2016 1128   BILIRUBINUR NEGATIVE 03/06/2016 1128   BILIRUBINUR Negative 09/08/2013 0133   KETONESUR NEGATIVE 03/06/2016 1128   PROTEINUR 100 (A) 03/06/2016 1128   NITRITE NEGATIVE 03/06/2016 1128   LEUKOCYTESUR NEGATIVE 03/06/2016 1128   LEUKOCYTESUR Negative 09/08/2013 0133     RADIOLOGY: Dg Chest Portable 1 View  Result Date: 09/09/2016 CLINICAL DATA:  Acute dyspnea EXAM:  PORTABLE CHEST 1 VIEW COMPARISON:  08/22/2016 FINDINGS: Dual-lumen left jugular central line extends into the SVC. Multifocal bilateral airspace opacity involving the central lung regions bilaterally, with more confluent dense consolidation in the right upper lobe. No large effusions. Hilar contours are obscured. Unchanged borderline cardiomegaly. IMPRESSION: New multifocal airspace opacities, greatest in the right upper lobe where there is confluent consolidation. This may represent pneumonia. Asymmetric alveolar edema is also a possibility. Electronically Signed   By: Andreas Newport M.D.   On: 09/09/2016 06:42    EKG: Orders placed or performed during the hospital encounter of  09/09/16  . ED EKG within 10 minutes  . ED EKG within 10 minutes  . ED EKG  . ED EKG    IMPRESSION AND PLAN: 60 year old female patient with history of end-stage renal disease on dialysis, hypertension, atrial fibrillation, DVT right leg, congestive heart failure presented to the emergency room with increased shortness of breath.  Admitting diagnosis 1. Acute hypoxic respiratory failure 2. Pulmonary edema 3. Healthcare associated pneumonia 4. End-stage renal disease on dialysis 5. Hyperkalemia 6. Hypertension Treatment plan Admit patient to ICU Continue BiPAP for respiratory failure Intensivist consultation Intensivist on-call informed Nephrology consultation for dialysis IV Lasix for diuresis Start patient on IV vancomycin and IV Zosyn antibiotic Resume Coumadin for anticoagulation Follow-up potassium level  All the records are reviewed and case discussed with ED provider. Management plans discussed with the patient, family and they are in agreement.  CODE STATUS:FULL CODE Code Status History    Date Active Date Inactive Code Status Order ID Comments User Context   08/21/2016 11:02 AM 08/23/2016  8:29 PM Full Code [id number]  [name], MD Inpatient   02/29/2016  5:22 PM 03/01/2016  8:55 PM DNR [id number]  Bettey Costa, MD Inpatient   02/29/2016  5:04 PM 02/29/2016  5:22 PM Full Code [id number]  Bettey Costa, MD Inpatient   01/23/2016  6:33 PM 01/29/2016  9:45 PM Full Code [id number]  Hubbard Robinson, MD Inpatient   01/22/2016  3:43 PM 01/23/2016  6:33 PM Full Code [id number]  Dustin Flock, MD Inpatient   12/31/2015  9:39 PM 01/03/2016  9:31 PM Full Code [id number]  [name], MD Inpatient   12/15/2015 10:40 PM 12/28/2015  6:52 PM Full Code [id number]  Hugelmeyer, Ubaldo Glassing, DO Inpatient   08/31/2015  4:26 AM 09/02/2015  2:35 PM Full Code [id number]  Harrie Foreman, MD Inpatient   08/27/2015  4:06 AM 08/29/2015  3:57 PM Full Code [id number]  Saundra Shelling, MD ED    07/09/2015  2:44 PM 07/12/2015  7:55 PM Full Code [id number]  Laverle Hobby, MD ED   09/19/2014 11:47 AM 09/20/2014  6:25 PM Full Code [id number]  Dustin Flock, MD ED   08/29/2014  2:28 PM 08/30/2014  5:43 PM Full Code [id number]  Fritzi Mandes, MD Inpatient       TOTAL CRITICAL CARE TIME TAKING CARE OF THIS PATIENT: 55 minutes.    Saundra Shelling M.D on 09/09/2016 at 7:03 AM  Between 7am to 6pm - Pager - [phone]  After 6pm go to [email or web address] - password [email or web address] [facility]  Office  [phone]  CC: Primary care physician; Murlean Iba, MD

## 2016-09-09 NOTE — Progress Notes (Signed)
Hd start 

## 2016-09-09 NOTE — Progress Notes (Signed)
Patient's HR and BP dropped during dialysis, cardene ongoing placed on hold, MD at bedside assessing patient, pt rinsed back and dialysis was stopped due to change of condition. Patient's VS improving.

## 2016-09-09 NOTE — ED Provider Notes (Signed)
[facility] Emergency Department Provider Note   ____________________________________________   First MD Initiated Contact with Patient 09/09/16 [phone]     (approximate)  I have reviewed the triage vital signs and the nursing notes.   HISTORY  Chief Complaint Shortness of Breath    HPI Teresa Cook is a 61 y.o. female brought to the ED from home via EMS with a chief complaint of respiratory distress. Patient has a history of ESRD on HD M/W/F. Scheduled for HD this morning at 10 AM. Recent hospitalization for same, secondary to fluid overload.At that time she received 120 mg IV Lasix along with nitroglycerin drip. patient states she was watching her fluid intake over the weekend but became progressively short of breath overnight. Endorses chest pain/tightness. Denies fever, chills, cough, abdominal pain, nausea, vomiting. Still make some urine daily.   Past Medical History:  Diagnosis Date  . Anemia   . Anxiety   . Atrial fibrillation ([place])   . Breast cancer ([place]) 01/2016   bilateral  . CHF (congestive heart failure) ([place])   . Chronic kidney disease   . Depression   . Dialysis patient ([place])   . DVT (deep venous thrombosis) (HCC)    left leg  . DVT (deep venous thrombosis) ([place]) 1985   right thigh  . Dysrhythmia   . Gout   . Headache   . HTN (hypertension)   . Hypertension   . Parathyroid abnormality ([place])   . Parathyroid disease ([place])   . Pneumonia 12/2015  . Psoriasis   . Renal insufficiency   . Sickle cell trait ([place])    traits    Patient Active Problem List   Diagnosis Date Noted  . HTN (hypertension) 02/29/2016  . Moderate recurrent major depression ([place]) 02/29/2016  . Panic attacks 02/29/2016  . Scalp lesion   . Breast cancer ([place]) 01/23/2016  . ESRD (end stage renal disease) ([place]) 01/22/2016  . Severe anemia 01/01/2016  . Seizure ([place]) 12/31/2015  . Healthcare-associated pneumonia   . Bloodstream infection due to  port-a-cath, initial encounter   . Sepsis ([place]) 12/15/2015  . Chest pain 08/31/2015  . SIRS (systemic inflammatory response syndrome) ([place]) 08/27/2015  . Malignant neoplasm of upper-inner quadrant of left breast in female, estrogen receptor positive ([place]) 08/05/2015  . Pulmonary edema 07/09/2015  . Dizziness 03/03/2015  . Seizures ([place]) 03/03/2015  . Spells 11/11/2014  . Acute respiratory failure with hypoxia ([place]) 09/19/2014  . Left ventricular dysfunction 09/06/2014  . Acute pulmonary edema ([place]) 08/30/2014  . Respiratory failure ([place]) 08/29/2014  . Respiratory difficulty 08/29/2014  . Chronic systolic heart failure ([place]) 05/16/2014  . Hypertension 05/16/2014  . Renal failure 05/16/2014  . Anxiety 05/16/2014  . Sickle cell trait ([place]) 03/03/2014  . ESRD on dialysis ([place]) 11/02/2013  . Dysgeusia 01/27/2013  . Weight loss 01/27/2013  . Resting tremor 08/13/2012  . Depression 04/30/2012  . 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 Right leg DVT ([place]) 04/30/2012    Past Surgical History:  Procedure Laterality Date  . ABDOMINAL HYSTERECTOMY  1980  . APPENDECTOMY    . BREAST BIOPSY Left 10/28/2013   benign  . BREAST EXCISIONAL BIOPSY Left 2002   benign  . INSERTION OF DIALYSIS CATHETER  2014  . LIPOMA EXCISION N/A 01/23/2016   Procedure: EXCISION LIPOMA;  Surgeon: Hubbard Robinson, MD;  Location: [facility] ORS;  Service: General;  Laterality: N/A;  . MASTECTOMY W/ SENTINEL NODE BIOPSY Bilateral 01/23/2016   Procedure: bilateral MASTECTOMY WITH  bilateral SENTINEL LYMPH NODE BIOPSY possible left axillary node dissection forehead lipoma removal;  Surgeon: Hubbard Robinson, MD;  Location: [facility] ORS;  Service: General;  Laterality: Bilateral;  . PARTIAL HYSTERECTOMY    . PERIPHERAL VASCULAR CATHETERIZATION N/A 12/25/2015   Procedure: Dialysis/Perma Catheter Insertion;  Surgeon: Algernon Huxley, MD;  Location: [place] [facility];  Service: Cardiovascular;  Laterality: N/A;    . PERIPHERAL VASCULAR CATHETERIZATION Left 01/22/2016   Procedure: Dialysis/Perma Catheter Insertion;  Surgeon: Algernon Huxley, MD;  Location: [place] [facility];  Service: Cardiovascular;  Laterality: Left;  . PERIPHERAL VASCULAR CATHETERIZATION N/A 01/26/2016   Procedure: Dialysis/Perma Catheter Insertion;  Surgeon: Katha Cabal, MD;  Location: [place] [facility];  Service: Cardiovascular;  Laterality: N/A;  . PORT-A-CATH REMOVAL N/A 12/20/2015   Procedure: REMOVAL PORT-A-CATH;  Surgeon: Hubbard Robinson, MD;  Location: [facility] ORS;  Service: General;  Laterality: N/A;  left    . PORTACATH PLACEMENT Left 08/21/2015   Procedure: INSERTION PORT-A-CATH;  Surgeon: Hubbard Robinson, MD;  Location: [facility] ORS;  Service: General;  Laterality: Left;  . REMOVAL OF A DIALYSIS CATHETER  2017    Prior to Admission medications   Medication Sig Start Date End Date Taking? Authorizing Provider  acetaminophen (TYLENOL) 325 MG tablet Take 2 tablets (650 mg total) by mouth every 6 (six) hours as needed for mild pain (or Fever >/= 101). 12/27/15   [name], MD  cinacalcet (SENSIPAR) 60 MG tablet Take 60 mg by mouth 3 (three) times daily with meals.    [provider]  feeding supplement, ENSURE ENLIVE, (ENSURE ENLIVE) LIQD Take 237 mLs by mouth 3 (three) times daily between meals. Patient taking differently: Take 237 mLs by mouth daily.  12/27/15   [name], MD  letrozole (FEMARA) 2.5 MG tablet Take 1 tablet (2.5 mg total) by mouth daily. 05/07/16   [name], MD  losartan (COZAAR) 100 MG tablet Take 100 mg by mouth 2 (two) times daily.  09/07/14   [provider]  metoprolol (LOPRESSOR) 100 MG tablet Take 0.5 tablets (50 mg total) by mouth 2 (two) times daily. Takes on non-dialysis days=Tuesday, Thursday, Saturday and Sunday. Patient taking differently: Take 100 mg by mouth 2 (two) times daily. Takes 100 MG DAILY on non-dialysis days= Monday, Wednesday, Friday 12/27/15    [name], MD  ondansetron (ZOFRAN) 8 MG tablet Take by mouth every 8 (eight) hours as needed for nausea or vomiting.    [provider]  pantoprazole (PROTONIX) 40 MG tablet Take 1 tablet (40 mg total) by mouth daily. Patient taking differently: Take 40 mg by mouth 2 (two) times daily.  03/01/16   [name], MD  prochlorperazine (COMPAZINE) 10 MG tablet Take 10 mg by mouth every 6 (six) hours as needed for nausea or vomiting.    [provider]  sertraline (ZOLOFT) 50 MG tablet Take 3 tablets (150 mg total) by mouth daily. Patient taking differently: Take 200 mg by mouth daily.  03/01/16   [name], MD  sevelamer carbonate (RENVELA) 800 MG tablet Take 2,400 mg by mouth 3 (three) times daily with meals. Tid with meals and with 1 snack     [provider]  traMADol (ULTRAM) 50 MG tablet Take 1 tablet (50 mg total) by mouth every 6 (six) hours as needed for moderate pain. 08/23/16   [name], MD  UNABLE TO FIND Med Name: Prosthetic Bra with Inserts 04/23/16   [name],  MD  warfarin (COUMADIN) 5 MG tablet take 5 mg on friday, saturday, sunday. and 1.5 mg on monday, tuesday, wednesday, thursday 11/03/13   [provider]  zolpidem (AMBIEN) 5 MG tablet Take 1 tablet (5 mg total) by mouth at bedtime. 03/01/16   [name], MD    Allergies Gabapentin and Adhesive [tape]  Family History  Problem Relation Age of Onset  . Stroke Mother   . CVA Mother   . Hypertension Mother   . Hypertension Father   . Hypertension Sister   . Diabetes Sister   . Cancer Sister 17       Breast  . Stroke Brother   . Hypertension Brother   . Breast cancer Maternal Aunt 70    Social History Social History  Substance Use Topics  . Smoking status: Never Smoker  . Smokeless tobacco: Never Used  . Alcohol use No    Review of Systems  Constitutional: No fever/chills. Eyes: No visual changes. ENT: No sore  throat. Cardiovascular: positive for chest pain. Respiratory: positive for shortness of breath. Gastrointestinal: No abdominal pain.  No nausea, no vomiting.  No diarrhea.  No constipation. Genitourinary: Negative for dysuria. Musculoskeletal: Negative for back pain. Skin: Negative for rash. Neurological: Negative for headaches, focal weakness or numbness.   ____________________________________________   PHYSICAL EXAM:  VITAL SIGNS: ED Triage Vitals [09/09/16 0617]  Enc Vitals Group     BP      Pulse      Resp      Temp      Temp src      SpO2      Weight 127 lb 8 oz (57.8 kg)     Height      Head Circumference      Peak Flow      Pain Score      Pain Loc      Pain Edu?      Excl. in [place]?     Constitutional: Alert and oriented. Uncomfortable appearing and in moderate acute distress. Eyes: Conjunctivae are normal. PERRL. EOMI. Head: Atraumatic. Nose: No congestion/rhinnorhea. Mouth/Throat: Mucous membranes are moist.  Oropharynx non-erythematous. Neck: No stridor.   Cardiovascular: Normal rate, regular rhythm. Grossly normal heart sounds.  Good peripheral circulation. Respiratory: increased respiratory effort.  No retractions. Lungs with diffuse rales. Gastrointestinal: Soft and nontender. No distention. No abdominal bruits. No CVA tenderness. Musculoskeletal: No lower extremity tenderness nor edema.  No joint effusions. Neurologic:  Normal speech and language. No gross focal neurologic deficits are appreciated.  Skin:  Skin is warm, dry and intact. No rash noted. Psychiatric: Mood and affect are normal. Speech and behavior are normal.  ____________________________________________   LABS (all labs ordered are listed, but only abnormal results are displayed)  Labs Reviewed  CBC - Abnormal; Notable for the following:       Result Value   WBC 12.9 (*)    RDW 17.2 (*)    All other components within normal limits  COMPREHENSIVE METABOLIC PANEL - Abnormal; Notable  for the following:    Potassium 6.1 (*)    CO2 20 (*)    Glucose, Bld 112 (*)    BUN 72 (*)    Creatinine, Ser 11.13 (*)    GFR calc non Af Amer 3 (*)    GFR calc Af Amer 4 (*)    All other components within normal limits  BLOOD GAS, ARTERIAL - Abnormal; Notable for the following:    pH, Arterial 7.28 (*)  pO2, Arterial 117 (*)    Bicarbonate 16.4 (*)    Acid-base deficit 9.5 (*)    All other components within normal limits  CULTURE, BLOOD (ROUTINE X 2)  CULTURE, BLOOD (ROUTINE X 2)  URINE CULTURE  TROPONIN I  PROTIME-INR  LACTIC ACID, PLASMA  LACTIC ACID, PLASMA  URINALYSIS, COMPLETE (UACMP) WITH MICROSCOPIC   ____________________________________________  EKG  ED ECG REPORT I, Liona Wengert J, the attending physician, personally viewed and interpreted this ECG.   Date: 09/09/2016  EKG Time: 0631  Rate: 92  Rhythm: normal EKG, normal sinus rhythm  Axis: normal  Intervals:LVH  ST&T Change: inverted T waves inferior lateral leads no significant change from 08/21/2016 ____________________________________________  RADIOLOGY  Dg Chest Portable 1 View  Result Date: 09/09/2016 CLINICAL DATA:  Acute dyspnea EXAM: PORTABLE CHEST 1 VIEW COMPARISON:  08/22/2016 FINDINGS: Dual-lumen left jugular central line extends into the SVC. Multifocal bilateral airspace opacity involving the central lung regions bilaterally, with more confluent dense consolidation in the right upper lobe. No large effusions. Hilar contours are obscured. Unchanged borderline cardiomegaly. IMPRESSION: New multifocal airspace opacities, greatest in the right upper lobe where there is confluent consolidation. This may represent pneumonia. Asymmetric alveolar edema is also a possibility. Electronically Signed   By: Andreas Newport M.D.   On: 09/09/2016 06:42    ____________________________________________   PROCEDURES  Procedure(s) performed: None  Procedures  Critical Care performed: Yes, see critical  care note(s)   CRITICAL CARE Performed by: Paulette Blanch   Total critical care time: 45 minutes  Critical care time was exclusive of separately billable procedures and treating other patients.  Critical care was necessary to treat or prevent imminent or life-threatening deterioration.  Critical care was time spent personally by me on the following activities: development of treatment plan with patient and/or surrogate as well as nursing, discussions with consultants, evaluation of patient's response to treatment, examination of patient, obtaining history from patient or surrogate, ordering and performing treatments and interventions, ordering and review of laboratory studies, ordering and review of radiographic studies, pulse oximetry and re-evaluation of patient's condition.  ____________________________________________   INITIAL IMPRESSION / ASSESSMENT AND PLAN / ED COURSE  Pertinent labs & imaging results that were available during my care of the patient were reviewed by me and considered in my medical decision making (see chart for details).  61 year old female with ESRD on HD who presents with respiratory distress secondary to pulmonary edema.   Clinical Course as of Sep 09 656  Mon Sep 09, 2016  5053 sats 78% on nonrebreather. Will place on BiPAP.  [JS]  0630 Wet read of chest x-ray looks very similar to patient's x-ray dated 08/21/2016; pulmonary edema. Patient is very hypertensive. Will initiate nitroglycerin drip and IV Lasix. Will speak with nephrology for emergent dialysis. Will discuss with hospitalist to evaluate patient in the emergency department for admission.  [JS]  [phone] Spoke with Dr. Holley Raring who will see the patient in the CCU.  [JS]    Clinical Course User Index [JS] Paulette Blanch, MD     ____________________________________________   FINAL CLINICAL IMPRESSION(S) / ED DIAGNOSES  Final diagnoses:  Hypoxia  SOB (shortness of breath)  Acute pulmonary edema  (HCC)  Hypertensive urgency  ESRD (end stage renal disease) on dialysis [facility])  Community acquired pneumonia of right upper lobe of lung ([place])      NEW MEDICATIONS STARTED DURING THIS VISIT:  New Prescriptions   No medications on file  Note:  This document was prepared using Dragon voice recognition software and may include unintentional dictation errors.    Paulette Blanch, MD 09/09/16 [phone]

## 2016-09-09 NOTE — Progress Notes (Signed)
Unable to complete HD tx. One hour complete. UF 400 ml. Pt vital signs became unstable. Rinsed back. Primary staff present. Lateef MD notified.

## 2016-09-09 NOTE — Progress Notes (Signed)
Pre hd info 

## 2016-09-09 NOTE — Progress Notes (Signed)
Post hd vitals 

## 2016-09-09 NOTE — ED Notes (Signed)
Pt labored.  Remains on bipap.  Rales bilateral. Alert and oriented. Skin warm and dry.

## 2016-09-09 NOTE — Consult Note (Signed)
Green [facility] Consultation     ASSESSMENT/PLAN  ACUTE HYPOXIC RESP FAILURE ? ASPIRATION PNEUMONIA ESRD ON HD FLUID OVERLOAD AFIB HYPERKALEMIA  PLAN BIPAP and weaning as tolerated for sats>90%.  On broad spectrum antibiotics. Swallow eval once stable. HD as per renal team. Rate/BP control. Full Code.  Cc: 8 minutes  Teresa Cook PCCM    ADMISSION DATE:  09/09/2016 CONSULTATION DATE:  09/09/2016  REFERRING Cook :  Hospitalist  CHIEF COMPLAINT:  Resp failure  HISTORY OF PRESENT ILLNESS:   88F with ESRD on HD, Afib/CHF admitted to hospital with worsening dyspnea. Patient noted to be hypoxic in ER and placed on BIPAP. Admitted to ICU for further evaluation/managment. Deneis any chest pain at ths time of my evaluation. No abdominal pain. Follows commands.  PAST MEDICAL HISTORY :  Past Medical History:  Diagnosis Date  . Anemia   . Anxiety   . Atrial fibrillation (Teresa Cook)   . Breast cancer (Teresa Cook) 01/2016   bilateral  . CHF (congestive heart failure) (Teresa Cook)   . Chronic kidney disease   . Depression   . Dialysis patient ([place])   . DVT (deep venous thrombosis) (HCC)    left leg  . DVT (deep venous thrombosis) ([place]) 1985   right thigh  . Dysrhythmia   . Gout   . Headache   . HTN (hypertension)   . Hypertension   . Parathyroid abnormality (Teresa Cook)   . Parathyroid disease (Teresa Cook)   . Pneumonia 12/2015  . Psoriasis   . Renal insufficiency   . Sickle cell trait (Teresa Cook)    traits   Past Surgical History:  Procedure Laterality Date  . ABDOMINAL HYSTERECTOMY  1980  . APPENDECTOMY    . BREAST BIOPSY Left 10/28/2013   benign  . BREAST EXCISIONAL BIOPSY Left 2002   benign  . INSERTION OF DIALYSIS CATHETER  2014  . LIPOMA EXCISION N/A 01/23/2016   Procedure: EXCISION LIPOMA;  Surgeon: Teresa Robinson, Cook;  Location: [facility] ORS;  Service: General;  Laterality: N/A;  . MASTECTOMY W/ SENTINEL NODE BIOPSY Bilateral 01/23/2016   Procedure: bilateral MASTECTOMY  WITH  bilateral SENTINEL LYMPH NODE BIOPSY possible left axillary node dissection forehead lipoma removal;  Surgeon: Teresa Robinson, Cook;  Location: [facility] ORS;  Service: General;  Laterality: Bilateral;  . PARTIAL HYSTERECTOMY    . PERIPHERAL VASCULAR CATHETERIZATION N/A 12/25/2015   Procedure: Dialysis/Perma Catheter Insertion;  Surgeon: Algernon Huxley, Cook;  Location: [place] [facility];  Service: Cardiovascular;  Laterality: N/A;  . PERIPHERAL VASCULAR CATHETERIZATION Left 01/22/2016   Procedure: Dialysis/Perma Catheter Insertion;  Surgeon: Algernon Huxley, Cook;  Location: [place] CV LAB;  Service: Cardiovascular;  Laterality: Left;  . PERIPHERAL VASCULAR CATHETERIZATION N/A 01/26/2016   Procedure: Dialysis/Perma Catheter Insertion;  Surgeon: Katha Cabal, Cook;  Location: [place] [facility] LAB;  Service: Cardiovascular;  Laterality: N/A;  . PORT-A-CATH REMOVAL N/A 12/20/2015   Procedure: REMOVAL PORT-A-CATH;  Surgeon: Teresa Robinson, Cook;  Location: [facility] ORS;  Service: General;  Laterality: N/A;  left    . PORTACATH PLACEMENT Left 08/21/2015   Procedure: INSERTION PORT-A-CATH;  Surgeon: Teresa Robinson, Cook;  Location: [facility] OR[facility];  Service: General;  Laterality: Left;  . REMOVAL OF A DIALYSIS CATHETER  2017   Prior to Admission medications   Medication Sig Start Date End Date Taking? Authorizing Provider  acetaminophen (TYLENOL) 325 MG tablet Take 2 tablets (650 mg total) by mouth every 6 (six) hours as needed for mild  pain (or Fever >/= 101). 12/27/15  Yes [name], [name], Cook  cinacalcet (SENSIPAR) 60 MG tablet Take 60 mg by mouth 3 (three) times daily with meals.   Yes Provider, Historical, Cook  letrozole (FEMARA) 2.5 MG tablet Take 1 tablet (2.5 mg total) by mouth daily. 05/07/16  Yes Teresa Huger, Cook  losartan (COZAAR) 100 MG tablet Take 100 mg by mouth 2 (two) times daily.  09/07/14  Yes Provider, Historical, Cook  metoprolol (LOPRESSOR) 100 MG tablet Take 0.5 tablets (50 mg total) by  mouth 2 (two) times daily. Takes on non-dialysis days=Tuesday, Thursday, Saturday and Sunday. Patient taking differently: Take 100 mg by mouth 2 (two) times daily. Takes 100 MG DAILY on non-dialysis days= Monday, Wednesday, Friday 12/27/15  Yes [name], [name], Cook  ondansetron (ZOFRAN) 8 MG tablet Take by mouth every 8 (eight) hours as needed for nausea or vomiting.   Yes Provider, Historical, Cook  pantoprazole (PROTONIX) 40 MG tablet Take 1 tablet (40 mg total) by mouth daily. Patient taking differently: Take 40 mg by mouth 2 (two) times daily.  03/01/16  Yes Teresa Basta, Cook  prochlorperazine (COMPAZINE) 10 MG tablet Take 10 mg by mouth every 6 (six) hours as needed for nausea or vomiting.   Yes Provider, Historical, Cook  sertraline (ZOLOFT) 50 MG tablet Take 3 tablets (150 mg total) by mouth daily. Patient taking differently: Take 200 mg by mouth daily.  03/01/16  Yes Teresa Basta, Cook  sevelamer carbonate (RENVELA) 800 MG tablet Take 2,400 mg by mouth 3 (three) times daily with meals. Tid with meals and with 1 snack    Yes Provider, Historical, Cook  traMADol (ULTRAM) 50 MG tablet Take 1 tablet (50 mg total) by mouth every 6 (six) hours as needed for moderate pain. 08/23/16  Yes Teresa Leber, Cook  warfarin (COUMADIN) 5 MG tablet take 5 mg on friday, saturday, sunday. and 1.5 mg on monday, tuesday, wednesday, thursday 11/03/13  Yes Provider, Historical, Cook  zolpidem (AMBIEN) 5 MG tablet Take 1 tablet (5 mg total) by mouth at bedtime. 03/01/16  Yes Teresa Basta, Cook  feeding supplement, ENSURE ENLIVE, (ENSURE ENLIVE) LIQD Take 237 mLs by mouth 3 (three) times daily between meals. Patient taking differently: Take 237 mLs by mouth daily.  12/27/15   [name], Cook  UNABLE TO FIND Med Name: Prosthetic Bra with Inserts 04/23/16   Teresa Glen, Cook   Allergies  Allergen Reactions  . Gabapentin Other (See Comments)    Seizure  . Adhesive [Tape] Itching    Silk tape is ok to  use.    FAMILY HISTORY:  Family History  Problem Relation Age of Onset  . Stroke Mother   . CVA Mother   . Hypertension Mother   . Hypertension Father   . Hypertension Sister   . Diabetes Sister   . Cancer Sister 58       Breast  . Stroke Brother   . Hypertension Brother   . Breast cancer Maternal Aunt 67   SOCIAL HISTORY:  reports that she has never smoked. She has never used smokeless tobacco. She reports that she does not drink alcohol or use drugs.  REVIEW OF SYSTEMS:   Constitutional: Feels well. Cardiovascular: No chest pain.  Pulmonary: Denies dyspnea.   The remainder of systems were reviewed and were found to be negative other than what is documented in the HPI.    VITAL SIGNS: Temp:  [98.8 F (37.1 C)] 98.8 F (37.1 C) (09/03  0815) Pulse Rate:  [54-99] 55 (09/03 1135) Resp:  [20-37] 22 (09/03 1135) BP: (55-203)/(37-168) 67/45 (09/03 1135) SpO2:  [90 %-100 %] 91 % (09/03 1135) FiO2 (%):  [40 %] 40 % (09/03 0815) Weight:  [127 lb 3.3 oz (57.7 kg)-127 lb 8 oz (57.8 kg)] 127 lb 3.3 oz (57.7 kg) (09/03 0815) HEMODYNAMICS:   VENTILATOR SETTINGS: FiO2 (%):  [40 %] 40 % INTAKE / OUTPUT:  Intake/Output Summary (Last 24 hours) at 09/09/16 1439 Last data filed at 09/09/16 1125  Gross per 24 hour  Intake            93.97 ml  Output              400 ml  Net          -306.03 ml    Physical Examination:   VS: BP (!) 67/45 (BP Location: Left Leg)   Pulse (!) 55   Temp 98.8 F (37.1 C) (Oral)   Resp (!) 22   Ht 5\' 4"  (1.626 m)   Wt 127 lb 3.3 oz (57.7 kg)   SpO2 91%   BMI 21.83 kg/m    General Appearance: No distress  Neuro: without focal findings, mental status, speech normal,. HEENT: PERRLA, EOM intact, no ptosis, no other lesions noticed;  Pulmonary: normal breath sounds., diaphragmatic excursion normal. Cardiovascular: Normal S1,S2.  No m/r/g.    Abdomen: Benign, Soft, non-tender, No masses, hepatosplenomegaly, No lymphadenopathy Extremities: normal,  no cyanosis, clubbing, no edema, warm with normal capillary refill.    LABS: Reviewed   LABORATORY PANEL:   CBC  Recent Labs Lab 09/09/16 0620  WBC 12.9*  HGB 13.1  HCT 39.9  PLT 288    Chemistries   Recent Labs Lab 09/09/16 0619 09/09/16 1135  NA 141  --   K 6.1*  --   CL 107  --   CO2 20*  --   GLUCOSE 112*  --   BUN 72*  --   CREATININE 11.13*  --   CALCIUM 9.5  --   PHOS  --  7.5*  AST 35  --   ALT 28  --   ALKPHOS 98  --   BILITOT 0.8  --      Recent Labs Lab 09/09/16 0805  GLUCAP 101*    Recent Labs Lab 09/09/16 0638 09/09/16 1004  PHART 7.28* 7.30*  PCO2ART 35 36  PO2ART 117* 69*    Recent Labs Lab 09/09/16 0619  AST 35  ALT 28  ALKPHOS 98  BILITOT 0.8  ALBUMIN 4.0    Cardiac Enzymes  Recent Labs Lab 09/09/16 1135  TROPONINI 0.05*    RADIOLOGY:  Dg Chest Portable 1 View  Result Date: 09/09/2016 CLINICAL DATA:  Acute dyspnea EXAM: PORTABLE CHEST 1 VIEW COMPARISON:  08/22/2016 FINDINGS: Dual-lumen left jugular central line extends into the SVC. Multifocal bilateral airspace opacity involving the central lung regions bilaterally, with more confluent dense consolidation in the right upper lobe. No large effusions. Hilar contours are obscured. Unchanged borderline cardiomegaly. IMPRESSION: New multifocal airspace opacities, greatest in the right upper lobe where there is confluent consolidation. This may represent pneumonia. Asymmetric alveolar edema is also a possibility. Electronically Signed   By: Andreas Newport M.D.   On: 09/09/2016 06:42     09/09/2016, 2:39 PM

## 2016-09-09 NOTE — Progress Notes (Signed)
eLink Physician-Brief Progress Note Patient Name: Debanhi Blaker DOB: 02/21/1955 MRN: [id number]   Date of Service  09/09/2016  HPI/Events of Note  C/o headache, NTG off now Apparently does not respond to tylenol or tramadol  eICU Interventions  Morphine 1 mg x1  Resume tramadol -home med     Intervention Category Minor Interventions: Routine modifications to care plan (e.g. PRN medications for pain, fever)  Bernadette Gores V. 09/09/2016, 4:22 PM

## 2016-09-09 NOTE — Progress Notes (Signed)
Pre hd assessment  

## 2016-09-09 NOTE — ED Notes (Signed)
Date and time results received: 09/09/16 0717 (use smartphrase ".now" to insert current time)  Test: troponin Critical Value: 0.04  Name of Provider Notified: pyreddy  \

## 2016-09-09 NOTE — ED Notes (Signed)
Lorriane Shire RN, informed of assigned bed

## 2016-09-09 NOTE — Progress Notes (Signed)
Pharmacy Antibiotic Note  Makyna Niehoff is a 61 y.o. female admitted on 09/09/2016 with pneumonia.  Pharmacy has been consulted for zosyn and vancomycin dosing.  Plan: Vancomycin 500mg  IV every MWF HD  hours.  Goal trough 15-20 mcg/mL. pt has HD MWF per MD Note Zosyn 3.375gm iv q12h for HD PNA  Weight: 127 lb 8 oz (57.8 kg)  No data recorded.   Recent Labs Lab 09/09/16 0619 09/09/16 0620  WBC  --  12.9*  CREATININE 11.13*  --     Estimated Creatinine Clearance: 4.5 mL/min (A) (by C-G formula based on SCr of 11.13 mg/dL (H)).    Allergies  Allergen Reactions  . Gabapentin Other (See Comments)    Seizure  . Adhesive [Tape] Itching    Silk tape is ok to use.    Antimicrobials this admission: 9/3 zosyn >>  9/3 vancomycin >>    Microbiology results: 9/3 BCx: x2 pending 9/3 UCx: pending   Thank you for allowing pharmacy to be a part of this patient's care.  Donna Christen Carleigh Buccieri 09/09/2016 7:14 AM

## 2016-09-09 NOTE — Progress Notes (Signed)
Patient ID: Teresa Cook, female   DOB: 1955-05-03, 61 y.o.   MRN: [id number]  [facility] PROGRESS NOTE  Raksha Wolfgang NKN:[id number] DOB: 03-Oct-1955 DOA: 09/09/2016 PCP: Murlean Iba, MD  HPI/Subjective: Patient's current blood pressure in the room 129/65. Patient was short of breath when she came into the hospital requiring urgent dialysis. Her blood pressure dropped down into the 50s and heart rate in the 30s and she became unresponsive.  Dialysis was stopped and she was contined on BiPAP.  Objective: Vitals:   09/09/16 1125 09/09/16 1135  BP: (!) 55/43 (!) 67/45  Pulse: (!) 59 (!) 55  Resp: (!) 26 (!) 22  Temp:    SpO2: 91% 91%    Filed Weights   09/09/16 0617 09/09/16 0815  Weight: 57.8 kg (127 lb 8 oz) 57.7 kg (127 lb 3.3 oz)    ROS: Review of Systems  Constitutional: Negative for chills and fever.  Eyes: Negative for blurred vision.  Respiratory: Positive for shortness of breath. Negative for cough.   Cardiovascular: Negative for chest pain.  Gastrointestinal: Positive for nausea. Negative for abdominal pain, constipation, diarrhea and vomiting.  Genitourinary: Negative for dysuria.  Musculoskeletal: Negative for joint pain.  Neurological: Positive for headaches. Negative for dizziness.   Exam: Physical Exam  Constitutional: She is oriented to person, place, and time.  HENT:  Nose: No mucosal edema.  Mouth/Throat: No oropharyngeal exudate or posterior oropharyngeal edema.  Eyes: Pupils are equal, round, and reactive to light. Conjunctivae, EOM and lids are normal.  Neck: No JVD present. Carotid bruit is not present. No edema present. No thyroid mass and no thyromegaly present.  Cardiovascular: S1 normal and S2 normal.  Exam reveals no gallop.   No murmur heard. Pulses:      Dorsalis pedis pulses are 2+ on the right side, and 2+ on the left side.  Respiratory: No respiratory distress. She has decreased breath sounds in the right lower field and the left  lower field. She has no wheezes. She has no rhonchi. She has rales in the right lower field and the left lower field.  GI: Soft. Bowel sounds are normal. There is no tenderness.  Musculoskeletal:       Right ankle: She exhibits no swelling.       Left ankle: She exhibits no swelling.  Lymphadenopathy:    She has no cervical adenopathy.  Neurological: She is alert and oriented to person, place, and time. No cranial nerve deficit.  Skin: Skin is warm. No rash noted. Nails show no clubbing.  Psychiatric: She has a normal mood and affect.      Data Reviewed: Basic Metabolic Panel:  Recent Labs Lab 09/09/16 0619 09/09/16 1135  NA 141  --   K 6.1*  --   CL 107  --   CO2 20*  --   GLUCOSE 112*  --   BUN 72*  --   CREATININE 11.13*  --   CALCIUM 9.5  --   PHOS  --  7.5*   Liver Function Tests:  Recent Labs Lab 09/09/16 0619  AST 35  ALT 28  ALKPHOS 98  BILITOT 0.8  PROT 8.0  ALBUMIN 4.0   CBC:  Recent Labs Lab 09/09/16 0620  WBC 12.9*  HGB 13.1  HCT 39.9  MCV 88.4  PLT 288   Cardiac Enzymes:  Recent Labs Lab 09/09/16 0620 09/09/16 1135  TROPONINI 0.04* 0.05*   BNP (last 3 results)  Recent Labs  01/15/16 2106 08/21/16 0818  BNP 281.0* 2,034.0*     CBG:  Recent Labs Lab 09/09/16 0805  GLUCAP 101*     Studies: Dg Chest Portable 1 View  Result Date: 09/09/2016 CLINICAL DATA:  Acute dyspnea EXAM: PORTABLE CHEST 1 VIEW COMPARISON:  08/22/2016 FINDINGS: Dual-lumen left jugular central line extends into the SVC. Multifocal bilateral airspace opacity involving the central lung regions bilaterally, with more confluent dense consolidation in the right upper lobe. No large effusions. Hilar contours are obscured. Unchanged borderline cardiomegaly. IMPRESSION: New multifocal airspace opacities, greatest in the right upper lobe where there is confluent consolidation. This may represent pneumonia. Asymmetric alveolar edema is also a possibility.  Electronically Signed   By: Andreas Newport M.D.   On: 09/09/2016 06:42    Scheduled Meds: . cinacalcet  60 mg Oral TID WC  . feeding supplement (ENSURE ENLIVE)  237 mL Oral Daily  . furosemide  60 mg Intravenous Q12H  . heparin  5,000 Units Subcutaneous Q8H  . losartan  100 mg Oral BID  . metoprolol tartrate  100 mg Oral Once per day on Mon Wed Fri  . [START ON 09/10/2016] metoprolol tartrate  50 mg Oral 2 times per day on Sun Tue Thu Sat  . pantoprazole  40 mg Oral Daily  . sertraline  150 mg Oral Daily  . sevelamer carbonate  2,400 mg Oral TID WC  . sodium chloride flush  3 mL Intravenous Q12H  . warfarin  5 mg Oral ONCE-1800  . Warfarin - Physician Dosing Inpatient   Does not apply q1800  . zolpidem  5 mg Oral QHS   Continuous Infusions: . sodium chloride    . niCARDipine Stopped (09/09/16 1208)  . piperacillin-tazobactam (ZOSYN)  IV    . vancomycin Stopped (09/09/16 1308)  . vancomycin      Assessment/Plan:  1. Acute hypoxic respiratory failure. Patient made it to the ICU for BiPAP.likely fluid overload. But on antibiotics for possible pneumonia.  Can send off pro calcitonin to see if infection or not. 2. 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 Dialysis to remove fluid.  IV Lasix ordered. 3. Accelerated hypertension on presentation then hypotension.Watch blood pressure closely in the ICU. Drips to lower blood pressure have been discontinued. Monitor closely on oral medications. 4. End-stage renal disease on dialysis as per nephrology 5. History of DVT and Atrial fibrillation on Coumadin. INR slightly low. Continue pharmacy dosing of Coumadin. 6. Depression on Zoloft   Code Status:     Code Status Orders        Start     Ordered   09/09/16 0806  Full code  Continuous     09/09/16 0805    Code Status History    Date Active Date Inactive Code Status Order ID Comments User Context   08/21/2016 11:02 AM 08/23/2016  8:29 PM Full  Code [id number]  [name], MD Inpatient   02/29/2016  5:22 PM 03/01/2016  8:55 PM DNR [id number]  Bettey Costa, MD Inpatient   02/29/2016  5:04 PM 02/29/2016  5:22 PM Full Code [id number]  Bettey Costa, MD Inpatient   01/23/2016  6:33 PM 01/29/2016  9:45 PM Full Code [id number]  Hubbard Robinson, MD Inpatient   01/22/2016  3:43 PM 01/23/2016  6:33 PM Full Code [id number]  Dustin Flock, MD Inpatient   12/31/2015  9:39 PM 01/03/2016  9:31 PM Full Code [id number]  [name], MD Inpatient   12/15/2015 10:40 PM 12/28/2015  6:52 PM Full  Code [id number]  Harvie Bridge, DO Inpatient   08/31/2015  4:26 AM 09/02/2015  2:35 PM Full Code [id number]  Harrie Foreman, MD Inpatient   08/27/2015  4:06 AM 08/29/2015  3:57 PM Full Code [id number]  Saundra Shelling, MD ED   07/09/2015  2:44 PM 07/12/2015  7:55 PM Full Code [id number]  Laverle Hobby, MD ED   09/19/2014 11:47 AM 09/20/2014  6:25 PM Full Code [id number]  Dustin Flock, MD ED   08/29/2014  2:28 PM 08/30/2014  5:43 PM Full Code [id number]  Fritzi Mandes, MD Inpatient    Advance Directive Documentation     Most Recent Value  Type of Advance Directive  Healthcare Power of Attorney  Pre-existing out of facility DNR order (yellow form or pink MOST form)  -  "MOST" Form in Place?  -     Family Communication: as per critical care specialist Disposition Plan: to be determined based on clinical course  Consultants:  Critical care specialist  nephrology  Antibiotics:  Vancomycin  Zosyn  Time spent: 32 minutes. Patient critically ill and requiring ICU care at this time.  Loletha Grayer  [employer]

## 2016-09-09 NOTE — Progress Notes (Signed)
This note also relates to the following rows which could not be included: Pulse Rate - Cannot attach notes to unvalidated device data Resp - Cannot attach notes to unvalidated device data BP - Cannot attach notes to unvalidated device data SpO2 - Cannot attach notes to unvalidated device data  End of hd

## 2016-09-09 NOTE — ED Triage Notes (Signed)
Pt arrived to ED via EMS from home with SOB. Per EMS pt was 85% RA on arrival, placed on non-re breather with 95%. Pt is dialysis pt, renal failure. Pt due for dialysis this AM. Pts lung sounds are wet bilaterally, with rales present. MD at bedside.

## 2016-09-10 ENCOUNTER — Inpatient Hospital Stay (HOSPITAL_COMMUNITY)
Admit: 2016-09-10 | Discharge: 2016-09-10 | Disposition: A | Discharge status: Home / Self Care | Payer: Medicare Other | Attending: Nephrology | Admitting: Nephrology

## 2016-09-10 DIAGNOSIS — I5021 Acute systolic (congestive) heart failure: Secondary | ICD-10-CM

## 2016-09-10 DIAGNOSIS — R9431 Abnormal electrocardiogram [ECG] [EKG]: Secondary | ICD-10-CM

## 2016-09-10 LAB — BASIC METABOLIC PANEL
Anion gap: 13 (ref 5–15)
BUN: 68 mg/dL — AB (ref 6–20)
CALCIUM: 8.4 mg/dL — AB (ref 8.9–10.3)
CO2: 22 mmol/L (ref 22–32)
CREATININE: 10.52 mg/dL — AB (ref 0.44–1.00)
Chloride: 103 mmol/L (ref 101–111)
GFR calc Af Amer: 4 mL/min — ABNORMAL LOW (ref >60)
GFR, EST NON AFRICAN AMERICAN: 3 mL/min — AB (ref >60)
GLUCOSE: 89 mg/dL (ref 65–99)
Potassium: 4.7 mmol/L (ref 3.5–5.1)
SODIUM: 138 mmol/L (ref 135–145)

## 2016-09-10 LAB — CBC
HEMATOCRIT: 31.1 % — AB (ref 35.0–47.0)
Hemoglobin: 10.6 g/dL — ABNORMAL LOW (ref 12.0–16.0)
MCH: 29.6 pg (ref 26.0–34.0)
MCHC: 34.2 g/dL (ref 32.0–36.0)
MCV: 86.7 fL (ref 80.0–100.0)
PLATELETS: 181 10*3/uL (ref 150–440)
RBC: 3.59 MIL/uL — ABNORMAL LOW (ref 3.80–5.20)
RDW: 16.8 % — AB (ref 11.5–14.5)
WBC: 7.4 10*3/uL (ref 3.6–11.0)

## 2016-09-10 LAB — URINALYSIS, COMPLETE (UACMP) WITH MICROSCOPIC
Bacteria, UA: NONE SEEN
Bilirubin Urine: NEGATIVE
GLUCOSE, UA: 50 mg/dL — AB
HGB URINE DIPSTICK: NEGATIVE
Ketones, ur: NEGATIVE mg/dL
Leukocytes, UA: NEGATIVE
Nitrite: NEGATIVE
PH: 8 (ref 5.0–8.0)
Protein, ur: 100 mg/dL — AB
Specific Gravity, Urine: 1.008 (ref 1.005–1.030)

## 2016-09-10 LAB — TROPONIN I: TROPONIN I: 0.06 ng/mL — AB (ref <0.03)

## 2016-09-10 LAB — PROTIME-INR
INR: 1.86
PROTHROMBIN TIME: 21.3 s — AB (ref 11.4–15.2)

## 2016-09-10 LAB — MAGNESIUM: MAGNESIUM: 2.3 mg/dL (ref 1.7–2.4)

## 2016-09-10 MED ORDER — WARFARIN - PHARMACIST DOSING INPATIENT
Freq: Every day | Status: DC
  Administered 2016-09-10 – 2016-09-11 (×2)

## 2016-09-10 MED ORDER — HYDRALAZINE HCL 50 MG PO TABS
50.0000 mg | ORAL_TABLET | Freq: Two times a day (BID) | ORAL | Status: DC
  Administered 2016-09-10 – 2016-09-11 (×4): 50 mg via ORAL
  Filled 2016-09-10 (×5): qty 1

## 2016-09-10 MED ORDER — WARFARIN SODIUM 4 MG PO TABS
4.0000 mg | ORAL_TABLET | Freq: Once | ORAL | Status: DC
  Filled 2016-09-10: qty 1

## 2016-09-10 MED ORDER — SALINE SPRAY 0.65 % NA SOLN
1.0000 | NASAL | Status: DC | PRN
  Filled 2016-09-10: qty 44

## 2016-09-10 MED ORDER — FLUTICASONE PROPIONATE 50 MCG/ACT NA SUSP
1.0000 | Freq: Every day | NASAL | Status: DC
  Administered 2016-09-12 – 2016-09-14 (×2): 1 via NASAL
  Filled 2016-09-10: qty 16

## 2016-09-10 MED ORDER — HYDRALAZINE HCL 20 MG/ML IJ SOLN
10.0000 mg | INTRAMUSCULAR | Status: DC | PRN
  Administered 2016-09-10 – 2016-09-12 (×2): 10 mg via INTRAVENOUS
  Filled 2016-09-10 (×2): qty 1

## 2016-09-10 MED ORDER — WARFARIN SODIUM 3 MG PO TABS
3.0000 mg | ORAL_TABLET | Freq: Once | ORAL | Status: AC
Start: 2016-09-10 — End: 2016-09-10
  Administered 2016-09-10: 3 mg via ORAL
  Filled 2016-09-10: qty 1

## 2016-09-10 MED ORDER — LOSARTAN POTASSIUM 50 MG PO TABS
100.0000 mg | ORAL_TABLET | Freq: Every day | ORAL | Status: DC
  Administered 2016-09-11 – 2016-09-13 (×3): 100 mg via ORAL
  Filled 2016-09-10 (×3): qty 2

## 2016-09-10 MED ORDER — OXYCODONE HCL 5 MG PO TABS
5.0000 mg | ORAL_TABLET | Freq: Once | ORAL | Status: AC
  Administered 2016-09-10: 5 mg via ORAL
  Filled 2016-09-10: qty 1

## 2016-09-10 NOTE — Progress Notes (Signed)
Called the on call MD Dr. Darvin Neighbours about patient still having a headache even after Tramadol.  He said he would put in a one time dose of Oxycodone.

## 2016-09-10 NOTE — Progress Notes (Signed)
[facility]  ROUNDING NOTE   Subjective:   Hemodialysis yesterday. Did not tolerate treatment with hypotension. Only 1 hour of treatment. UF of 400.  Placed on nicardipine gtt and BIPAP. Now off both and on [place] O2 and has elevated blood pressures.   Objective:  Vital signs in last 24 hours:  Temp:  [97.8 F (36.6 C)-98.9 F (37.2 C)] 98.2 F (36.8 C) (09/04 0800) Pulse Rate:  [54-89] 72 (09/04 0600) Resp:  [16-26] 19 (09/04 0600) BP: (55-157)/(37-108) 146/88 (09/04 0600) SpO2:  [90 %-100 %] 97 % (09/04 0600) FiO2 (%):  [70 %] 70 % (09/03 1500)  Weight change: -0.134 kg (-4.7 oz) Filed Weights   09/09/16 0617 09/09/16 0815  Weight: 57.8 kg (127 lb 8 oz) 57.7 kg (127 lb 3.3 oz)    Intake/Output: I/O last 3 completed shifts: In: 446.3 [P.O.:120; I.V.:176.3; IV Piggyback:150] Out: 475 [Urine:75; Other:400]   Intake/Output this shift:  Total I/O In: 324.7 [P.O.:240; I.V.:34.7; IV Piggyback:50] Out: -   Physical Exam: General: Sitting in bed  Head: Teresa Cook/AT, Hearing intact   Eyes: Anicteric  Neck: Supple, trachea midline  Lungs:  Right upper quadrant rales  Heart: regular  Abdomen:  Soft, nontender  Extremities: No peripheral edema.  Neurologic: Nonfocal, moving all four extremities  Skin: No lesions  Access: Left IJ permcath    Basic Metabolic Panel:  Recent Labs Lab 09/09/16 0619 09/09/16 1135 09/10/16 0238  NA 141  --  138  K 6.1*  --  4.7  CL 107  --  103  CO2 20*  --  22  GLUCOSE 112*  --  89  BUN 72*  --  68*  CREATININE 11.13*  --  10.52*  CALCIUM 9.5  --  8.4*  MG  --   --  2.3  PHOS  --  7.5*  --     Liver Function Tests:  Recent Labs Lab 09/09/16 0619  AST 35  ALT 28  ALKPHOS 98  BILITOT 0.8  PROT 8.0  ALBUMIN 4.0   No results for input(s): LIPASE, AMYLASE in the last 168 hours. No results for input(s): AMMONIA in the last 168 hours.  CBC:  Recent Labs Lab 09/09/16 0620 09/10/16 0238  WBC 12.9* 7.4  HGB 13.1  10.6*  HCT 39.9 31.1*  MCV 88.4 86.7  PLT 288 181    Cardiac Enzymes:  Recent Labs Lab 09/09/16 0620 09/09/16 1135 09/09/16 1853 09/10/16 0238  TROPONINI 0.04* 0.05* 0.09* 0.06*    BNP: Invalid input(s): POCBNP  CBG:  Recent Labs Lab 09/09/16 0805  GLUCAP 101*    Microbiology: Results for orders placed or performed during the hospital encounter of 09/09/16  Culture, blood (routine x 2)     Status: None (Preliminary result)   Collection Time: 09/09/16  6:58 AM  Result Value Ref Range Status   Specimen Description BLOOD RIGHT ANTECUBITAL  Final   Special Requests   Final    BOTTLES DRAWN AEROBIC AND ANAEROBIC Blood Culture adequate volume   Culture NO GROWTH 1 DAY  Final   Report Status PENDING  Incomplete  Culture, blood (routine x 2)     Status: None (Preliminary result)   Collection Time: 09/09/16  7:03 AM  Result Value Ref Range Status   Specimen Description BLOOD BLOOD RIGHT WRIST  Final   Special Requests   Final    BOTTLES DRAWN AEROBIC AND ANAEROBIC Blood Culture adequate volume   Culture NO GROWTH 1 DAY  Final  Report Status PENDING  Incomplete    Coagulation Studies:  Recent Labs  09/09/16 0620 09/10/16 0238  LABPROT 20.8* 21.3*  INR 1.81 1.86    Urinalysis: No results for input(s): COLORURINE, LABSPEC, PHURINE, GLUCOSEU, HGBUR, BILIRUBINUR, KETONESUR, PROTEINUR, UROBILINOGEN, NITRITE, LEUKOCYTESUR in the last 72 hours.  Invalid input(s): APPERANCEUR    Imaging: Dg Chest Portable 1 View  Result Date: 09/09/2016 CLINICAL DATA:  Acute dyspnea EXAM: PORTABLE CHEST 1 VIEW COMPARISON:  08/22/2016 FINDINGS: Dual-lumen left jugular central line extends into the SVC. Multifocal bilateral airspace opacity involving the central lung regions bilaterally, with more confluent dense consolidation in the right upper lobe. No large effusions. Hilar contours are obscured. Unchanged borderline cardiomegaly. IMPRESSION: New multifocal airspace opacities,  greatest in the right upper lobe where there is confluent consolidation. This may represent pneumonia. Asymmetric alveolar edema is also a possibility. Electronically Signed   By: Andreas Newport M.D.   On: 09/09/2016 06:42     Medications:   . sodium chloride    . niCARDipine 1 mg/hr (09/10/16 0600)  . piperacillin-tazobactam (ZOSYN)  IV 3.375 g (09/10/16 0828)  . vancomycin Stopped (09/09/16 1308)  . vancomycin     . cinacalcet  60 mg Oral TID WC  . feeding supplement (ENSURE ENLIVE)  237 mL Oral Daily  . furosemide  60 mg Intravenous Q12H  . heparin  5,000 Units Subcutaneous Q8H  . losartan  100 mg Oral BID  . metoprolol tartrate  100 mg Oral Once per day on Mon Wed Fri  . metoprolol tartrate  50 mg Oral 2 times per day on Sun Tue Thu Sat  . pantoprazole  40 mg Oral Daily  . sertraline  150 mg Oral Daily  . sevelamer carbonate  2,400 mg Oral TID WC  . sodium chloride flush  3 mL Intravenous Q12H  . Warfarin - Physician Dosing Inpatient   Does not apply q1800  . zolpidem  5 mg Oral QHS   sodium chloride, acetaminophen **OR** acetaminophen, ondansetron **OR** ondansetron (ZOFRAN) IV, senna-docusate, sodium chloride flush, traMADol  Assessment/ Plan:  [name] is a 61 y.o. black female with ESRD on hemodialysis, breast cancer status post mastectomy, congestive heart failure, depression, DVT, seizure disorder, anemia, sickle cell trait, hypertension, gout, atrial fibrillation  [place]. MWF  1. End Stage Renal Disease: Did not get adequate dialysis yesterday. However no acute indication for dialysis today.  Plan on dialysis tomorrow and resume MWF schedule.   2. Acute Respiratory Failure: on BIPAP on admission. With RUL pneumonia on examination and CXR. WBC improved. Afebrile.  - continue [place] O2 - empiric zosyn and vanco - Continue supportive care.   3. Hypertension: elevated. Off nicardipine gtt. Nitro gave headache - restarted home regimen of  losartan and metoprolol - IV furosemide 60mg  IV q12. Transition to PO tomorrow.  - Start hydralazine PRN - Check echocardiogram   4. Anemia of chronic kidney disease: Hemoglobin 10.6 - EPO with HD treatment  5. Secondary Hyperparathyroidism with hyperphosphatemia. Outpatient phos 6.9. PTH and calcium at goal.  - Continue sevelamer.   LOS: 1 Darleny Sem 9/4/201810:11 AM

## 2016-09-10 NOTE — Progress Notes (Signed)
Pt received lasix at 7pm. C/o of feeling tightness at 7:30pm. B/p was elevated and prn iv hydralazine given. Pt b/p rechecked at 8pm and still elevated. This rn was going to give scheduled b/p meds early but pt c/o nausea. Prn iv nausea med given and once pt nausea resolved will give po meds. Dr.sudini notified and in agreeance.

## 2016-09-10 NOTE — Evaluation (Signed)
Physical Therapy Evaluation Patient Details Name: Teresa Cook MRN: [id number] DOB: 07-20-1955 Today's Date: 09/10/2016   History of Present Illness  Pt admitted for pulm edema. Pt with history of ESRD on HD, CHF, L DVT, gout, HTN, and breast cancer. Hospital admission complicated by acute hypoxic respiratory failure with vitals dropping in HD on 9/3. Pt moved to CCU and now transferred out. Pt on 2L of O2 upon arrival to room.  Clinical Impression  Pt is a pleasant 61 year old female who was admitted for pulm edema. Pt demonstrates all bed mobility/transfers/ambulation at baseline level independently without AD. All mobility performed on room air with sats at 91% and no SOB symptoms. RN notified. Pt does not require any further PT needs at this time. Pt will be dc in house and does not require follow up. RN aware. Will dc current orders.      Follow Up Recommendations No PT follow up    Equipment Recommendations  None recommended by PT    Recommendations for Other Services       Precautions / Restrictions Precautions Precautions: Fall Restrictions Weight Bearing Restrictions: No      Mobility  Bed Mobility Overal bed mobility: Independent             General bed mobility comments: safe technique performed with no AD. Once seated at EOB, able to sit with upright posture.  Transfers Overall transfer level: Independent   Transfers: Sit to/from Stand Sit to Stand: Independent         General transfer comment: safe technique performed without AD. UPright posture noted  Ambulation/Gait Ambulation/Gait assistance: Independent Ambulation Distance (Feet): 200 Feet Assistive device: None       General Gait Details: safe technique performed with reciprocal gait pattern used.  Stairs            Wheelchair Mobility    Modified Rankin (Stroke Patients Only)       Balance Overall balance assessment: Independent                                            Pertinent Vitals/Pain Pain Assessment: No/denies pain    Home Living Family/patient expects to be discharged to:: Private residence Living Arrangements: Alone Available Help at Discharge: Family Type of Home: House Home Access: Stairs to enter Entrance Stairs-Rails: Can reach both Entrance Stairs-Number of Steps: 3 Home Layout: One level        Prior Function Level of Independence: Independent         Comments: indep prior, reports no falls     Hand Dominance        Extremity/Trunk Assessment   Upper Extremity Assessment Upper Extremity Assessment: Overall WFL for tasks assessed    Lower Extremity Assessment Lower Extremity Assessment: Overall WFL for tasks assessed       Communication   Communication: No difficulties  Cognition Arousal/Alertness: Awake/alert Behavior During Therapy: WFL for tasks assessed/performed Overall Cognitive Status: Within Functional Limits for tasks assessed                                        General Comments      Exercises     Assessment/Plan    PT Assessment Patent does not need any further PT services  PT Problem  List         PT Treatment Interventions      PT Goals (Current goals can be found in the Care Plan section)  Acute Rehab PT Goals Patient Stated Goal: to go home tomorrow PT Goal Formulation: All assessment and education complete, DC therapy Time For Goal Achievement: 09/10/16 Potential to Achieve Goals: Good    Frequency     Barriers to discharge        Co-evaluation               AM-PAC PT "6 Clicks" Daily Activity  Outcome Measure Difficulty turning over in bed (including adjusting bedclothes, sheets and blankets)?: None Difficulty moving from lying on back to sitting on the side of the bed? : None Difficulty sitting down on and standing up from a chair with arms (e.g., wheelchair, bedside commode, etc,.)?: None Help needed moving to and from a bed  to chair (including a wheelchair)?: None Help needed walking in hospital room?: None Help needed climbing 3-5 steps with a railing? : None 6 Click Score: 24    End of Session Equipment Utilized During Treatment: Gait belt Activity Tolerance: Patient tolerated treatment well Patient left: in bed;with bed alarm set Nurse Communication: Mobility status PT Visit Diagnosis: Muscle weakness (generalized) (M62.81)    Time: 3335-4562 PT Time Calculation (min) (ACUTE ONLY): 15 min   Charges:   PT Evaluation $PT Eval Low Complexity: 1 Low     PT G CodesGreggory Stallion, PT, DPT [phone]   Rolf Fells 09/10/2016, 4:23 PM

## 2016-09-10 NOTE — Progress Notes (Signed)
Pt did not like CPT and did not want to continue throughout the night.

## 2016-09-10 NOTE — Progress Notes (Signed)
ANTICOAGULATION CONSULT NOTE - Initial Consult  Pharmacy Consult for warfarin Indication: atrial fibrillation  Allergies  Allergen Reactions  . Gabapentin Other (See Comments)    Seizure  . Adhesive [Tape] Itching    Silk tape is ok to use.    Patient Measurements: Height: 5\' 4"  (162.6 cm) Weight: 127 lb 3.3 oz (57.7 kg) IBW/kg (Calculated) : 54.7  Vital Signs: Temp: 97.9 F (36.6 C) (09/04 1507) Temp Source: Oral (09/04 1507) BP: 180/92 (09/04 1507) Pulse Rate: 60 (09/04 1507)  Labs:  Recent Labs  09/09/16 0619  09/09/16 0620 09/09/16 1135 09/09/16 1853 09/10/16 0238  HGB  --   --  13.1  --   --  10.6*  HCT  --   --  39.9  --   --  31.1*  PLT  --   --  288  --   --  181  LABPROT  --   --  20.8*  --   --  21.3*  INR  --   --  1.81  --   --  1.86  CREATININE 11.13*  --   --   --   --  10.52*  TROPONINI  --   < > 0.04* 0.05* 0.09* 0.06*  < > = values in this interval not displayed.  Estimated Creatinine Clearance: 4.8 mL/min (A) (by C-G formula based on SCr of 10.52 mg/dL (H)).   Medical History: Past Medical History:  Diagnosis Date  . Anemia   . Anxiety   . Atrial fibrillation ([place])   . Breast cancer ([place]) 01/2016   bilateral  . CHF (congestive heart failure) ([place])   . Chronic kidney disease   . Depression   . Dialysis patient ([place])   . DVT (deep venous thrombosis) ([facility])    left leg  . DVT (deep venous thrombosis) ([place]) 1985   right thigh  . Dysrhythmia   . Gout   . Headache   . HTN (hypertension)   . Hypertension   . Parathyroid abnormality ([place])   . Parathyroid disease ([place])   . Pneumonia 12/2015  . Psoriasis   . Renal insufficiency   . Sickle cell trait ([facility])    traits    Assessment: 61 year old patient on warfarin for atrial fibrillation. Patient's home regimen of warfarin is 1.5mg  M,Tu,W,Th and 5 mg on Fri, Sat, Sun.   Date INR Dose  9/3 1.81 5mg   9/4 1.86 3mg    Goal of Therapy:  INR 2-3 Monitor platelets by anticoagulation  protocol: Yes   Plan:  Will give warfarin 3 mg this evening based on home regimen and INR slightly subtherapeutic. Will follow up with INR tomorrow AM.    [place] Resident  09/10/2016,5:53 PM

## 2016-09-10 NOTE — Progress Notes (Signed)
Called Dr. Leslye Peer to question whether we should be using the left arm for BP since she has a recent history of bilateral mastectomy.  He said it was ok to use the legs for BPs.

## 2016-09-10 NOTE — Progress Notes (Signed)
Patient ID: Moraima Burd, female   DOB: 1955-11-14, 61 y.o.   MRN: [id number]  [facility] PROGRESS NOTE  Ajee Heasley LAG:[id number] DOB: [birth year] DOA: 09/09/2016 PCP: Murlean Iba, MD  HPI/Subjective: Patient's current blood pressure in the room 129/65. Patient was short of breath when she came into the hospital requiring urgent dialysis. Her blood pressure dropped down into the 50s and heart rate in the 30s and she became unresponsive.  Dialysis was stopped and she was contined on BiPAP.  Objective: Vitals:   09/10/16 0800 09/10/16 1200  BP:    Pulse:    Resp:    Temp: 98.2 F (36.8 C) 98.6 F (37 C)  SpO2:      Filed Weights   09/09/16 0617 09/09/16 0815  Weight: 57.8 kg (127 lb 8 oz) 57.7 kg (127 lb 3.3 oz)    ROS: Review of Systems  Constitutional: Negative for chills and fever.  Eyes: Negative for blurred vision.  Respiratory: Positive for shortness of breath. Negative for cough.   Cardiovascular: Negative for chest pain.  Gastrointestinal: Positive for nausea. Negative for abdominal pain, constipation, diarrhea and vomiting.  Genitourinary: Negative for dysuria.  Musculoskeletal: Negative for joint pain.  Neurological: Positive for headaches. Negative for dizziness.   Exam: Physical Exam  Constitutional: She is oriented to person, place, and time.  HENT:  Nose: No mucosal edema.  Mouth/Throat: No oropharyngeal exudate or posterior oropharyngeal edema.  Eyes: Pupils are equal, round, and reactive to light. Conjunctivae, EOM and lids are normal.  Neck: No JVD present. Carotid bruit is not present. No edema present. No thyroid mass and no thyromegaly present.  Cardiovascular: S1 normal and S2 normal.  Exam reveals no gallop.   No murmur heard. Pulses:      Dorsalis pedis pulses are 2+ on the right side, and 2+ on the left side.  Respiratory: No respiratory distress. She has decreased breath sounds in the right lower field and the left lower field. She  has no wheezes. She has no rhonchi. She has rales in the right lower field and the left lower field.  GI: Soft. Bowel sounds are normal. There is no tenderness.  Musculoskeletal:       Right ankle: She exhibits no swelling.       Left ankle: She exhibits no swelling.  Lymphadenopathy:    She has no cervical adenopathy.  Neurological: She is alert and oriented to person, place, and time. No cranial nerve deficit.  Skin: Skin is warm. No rash noted. Nails show no clubbing.  Psychiatric: She has a normal mood and affect.      Data Reviewed: Basic Metabolic Panel:  Recent Labs Lab 09/09/16 0619 09/09/16 1135 09/10/16 0238  NA 141  --  138  K 6.1*  --  4.7  CL 107  --  103  CO2 20*  --  22  GLUCOSE 112*  --  89  BUN 72*  --  68*  CREATININE 11.13*  --  10.52*  CALCIUM 9.5  --  8.4*  MG  --   --  2.3  PHOS  --  7.5*  --    Liver Function Tests:  Recent Labs Lab 09/09/16 0619  AST 35  ALT 28  ALKPHOS 98  BILITOT 0.8  PROT 8.0  ALBUMIN 4.0   CBC:  Recent Labs Lab 09/09/16 0620 09/10/16 0238  WBC 12.9* 7.4  HGB 13.1 10.6*  HCT 39.9 31.1*  MCV 88.4 86.7  PLT 288 181  Cardiac Enzymes:  Recent Labs Lab 09/09/16 0620 09/09/16 1135 09/09/16 1853 09/10/16 0238  TROPONINI 0.04* 0.05* 0.09* 0.06*   BNP (last 3 results)  Recent Labs  01/15/16 2106 08/21/16 0818  BNP 281.0* 2,034.0*     CBG:  Recent Labs Lab 09/09/16 0805  GLUCAP 101*     Studies: Dg Chest Portable 1 View  Result Date: 09/09/2016 CLINICAL DATA:  Acute dyspnea EXAM: PORTABLE CHEST 1 VIEW COMPARISON:  08/22/2016 FINDINGS: Dual-lumen left jugular central line extends into the SVC. Multifocal bilateral airspace opacity involving the central lung regions bilaterally, with more confluent dense consolidation in the right upper lobe. No large effusions. Hilar contours are obscured. Unchanged borderline cardiomegaly. IMPRESSION: New multifocal airspace opacities, greatest in the right  upper lobe where there is confluent consolidation. This may represent pneumonia. Asymmetric alveolar edema is also a possibility. Electronically Signed   By: Andreas Newport M.D.   On: 09/09/2016 06:42    Scheduled Meds: . cinacalcet  60 mg Oral TID WC  . feeding supplement (ENSURE ENLIVE)  237 mL Oral Daily  . furosemide  60 mg Intravenous Q12H  . heparin  5,000 Units Subcutaneous Q8H  . hydrALAZINE  50 mg Oral BID  . [START ON 09/11/2016] losartan  100 mg Oral Daily  . metoprolol tartrate  100 mg Oral Once per day on Mon Wed Fri  . metoprolol tartrate  50 mg Oral 2 times per day on Sun Tue Thu Sat  . pantoprazole  40 mg Oral Daily  . sertraline  150 mg Oral Daily  . sevelamer carbonate  2,400 mg Oral TID WC  . sodium chloride flush  3 mL Intravenous Q12H  . Warfarin - Physician Dosing Inpatient   Does not apply q1800  . zolpidem  5 mg Oral QHS   Continuous Infusions: . sodium chloride    . niCARDipine 1 mg/hr (09/10/16 0600)    Assessment/Plan:  1. Acute hypoxic respiratory failure.   Patient required BiPAP on presentation to the ICU. Currently on oxygen 4 L. In nursing staff and I'll down to 2 L. 2. 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 Dialysis to remove fluid.  IV Lasix ordered. On metoprolol. 3. Accelerated hypertension on presentation then hypotension.  Oral medications to control blood pressure at this point. 4. End-stage renal disease on dialysis as per nephrology 5. History of DVT and Atrial fibrillation on Coumadin. INR 1.84 today. Continue pharmacy dosing of Coumadin. 6. Depression on Zoloft   Code Status:     Code Status Orders        Start     Ordered   09/09/16 0806  Full code  Continuous     09/09/16 0805    Code Status History    Date Active Date Inactive Code Status Order ID Comments User Context   08/21/2016 11:02 AM 08/23/2016  8:29 PM Full Code [id number]  [name], MD Inpatient   02/29/2016  5:22 PM  03/01/2016  8:55 PM DNR [id number]  Bettey Costa, MD Inpatient   02/29/2016  5:04 PM 02/29/2016  5:22 PM Full Code [id number]  Bettey Costa, MD Inpatient   01/23/2016  6:33 PM 01/29/2016  9:45 PM Full Code [id number]  Hubbard Robinson, MD Inpatient   01/22/2016  3:43 PM 01/23/2016  6:33 PM Full Code [id number]  Dustin Flock, MD Inpatient   12/31/2015  9:39 PM 01/03/2016  9:31 PM Full Code [id number]  [name], MD Inpatient   12/15/2015 10:40  PM 12/28/2015  6:52 PM Full Code [id number]  Harvie Bridge, DO Inpatient   08/31/2015  4:26 AM 09/02/2015  2:35 PM Full Code [id number]  Harrie Foreman, MD Inpatient   08/27/2015  4:06 AM 08/29/2015  3:57 PM Full Code [id number]  Saundra Shelling, MD ED   07/09/2015  2:44 PM 07/12/2015  7:55 PM Full Code [id number]  Laverle Hobby, MD ED   09/19/2014 11:47 AM 09/20/2014  6:25 PM Full Code [id number]  Dustin Flock, MD ED   08/29/2014  2:28 PM 08/30/2014  5:43 PM Full Code [id number]  Fritzi Mandes, MD Inpatient    Advance Directive Documentation     Most Recent Value  Type of Advance Directive  Healthcare Power of Attorney  Pre-existing out of facility DNR order (yellow form or pink MOST form)  -  "MOST" Form in Place?  -     Family Communication: as per critical care specialist Disposition Plan: likely out of the ICU today  Consultants:  Critical care specialist  nephrology  Antibiotics:  Vancomycin  Zosyn  Time spent: 25 minutes  Loletha Grayer  [employer]

## 2016-09-10 NOTE — Progress Notes (Deleted)
Teresa Cook for warfarin Indication: atrial fibrillation  Allergies  Allergen Reactions  . Gabapentin Other (See Comments)    Seizure  . Adhesive [Tape] Itching    Silk tape is ok to use.    Patient Measurements: Height: 5\' 4"  (162.6 cm) Weight: 127 lb 3.3 oz (57.7 kg) IBW/kg (Calculated) : 54.7  Vital Signs: Temp: 98.8 F (37.1 C) (09/04 2116) Temp Source: Oral (09/04 2116) BP: 171/79 (09/04 2116) Pulse Rate: 77 (09/04 2116)  Labs:  Recent Labs  09/09/16 1093  09/09/16 0620 09/09/16 1135 09/09/16 1853 09/10/16 0238  HGB  --   --  13.1  --   --  10.6*  HCT  --   --  39.9  --   --  31.1*  PLT  --   --  288  --   --  181  LABPROT  --   --  20.8*  --   --  21.3*  INR  --   --  1.81  --   --  1.86  CREATININE 11.13*  --   --   --   --  10.52*  TROPONINI  --   < > 0.04* 0.05* 0.09* 0.06*  < > = values in this interval not displayed.  Estimated Creatinine Clearance: 4.8 mL/min (A) (by C-G formula based on SCr of 10.52 mg/dL (H)).   Medical History: Past Medical History:  Diagnosis Date  . Anemia   . Anxiety   . Atrial fibrillation ([place])   . Breast cancer ([place]) 01/2016   bilateral  . CHF (congestive heart failure) ([place])   . Chronic kidney disease   . Depression   . Dialysis patient ([place])   . DVT (deep venous thrombosis) ([facility])    left leg  . DVT (deep venous thrombosis) ([place]) 1985   right thigh  . Dysrhythmia   . Gout   . Headache   . HTN (hypertension)   . Hypertension   . Parathyroid abnormality ([place])   . Parathyroid disease ([place])   . Pneumonia 12/2015  . Psoriasis   . Renal insufficiency   . Sickle cell trait ([facility])    traits    Assessment: 61 year old patient on warfarin for atrial fibrillation. Patient's home regimen of warfarin is 1.5mg  M,Tu,W,Th and 5 mg on Fri, Sat, Sun.   Date INR Dose  9/3 1.81 5mg   9/4 1.86 3mg    Goal of Therapy:  INR 2-3 Monitor platelets by anticoagulation protocol: Yes    Plan:  Will give warfarin 3 mg this evening based on home regimen and INR slightly subtherapeutic. Will follow up with INR tomorrow AM.    Charlett Nose  Pharmacy Resident  09/10/2016,11:04 PM

## 2016-09-10 NOTE — Progress Notes (Signed)
Report given to [facility] nurse on 2C, no ss of distress noted, pt transferred on telemetry and oxygen. Family notified.

## 2016-09-11 DIAGNOSIS — L8990 Pressure ulcer of unspecified site, unspecified stage: Secondary | ICD-10-CM | POA: Insufficient documentation

## 2016-09-11 LAB — RENAL FUNCTION PANEL
ALBUMIN: 3.2 g/dL — AB (ref 3.5–5.0)
ANION GAP: 13 (ref 5–15)
BUN: 78 mg/dL — ABNORMAL HIGH (ref 6–20)
CALCIUM: 8.7 mg/dL — AB (ref 8.9–10.3)
CO2: 24 mmol/L (ref 22–32)
CREATININE: 13.27 mg/dL — AB (ref 0.44–1.00)
Chloride: 102 mmol/L (ref 101–111)
GFR, EST AFRICAN AMERICAN: 3 mL/min — AB (ref >60)
GFR, EST NON AFRICAN AMERICAN: 3 mL/min — AB (ref >60)
Glucose, Bld: 117 mg/dL — ABNORMAL HIGH (ref 65–99)
Phosphorus: 7.3 mg/dL — ABNORMAL HIGH (ref 2.5–4.6)
Potassium: 4.8 mmol/L (ref 3.5–5.1)
SODIUM: 139 mmol/L (ref 135–145)

## 2016-09-11 LAB — PROTIME-INR
INR: 1.67
PROTHROMBIN TIME: 19.6 s — AB (ref 11.4–15.2)

## 2016-09-11 LAB — ECHOCARDIOGRAM COMPLETE
Height: 64 in
WEIGHTICAEL: 2035.29 [oz_av]

## 2016-09-11 LAB — CBC
HCT: 29.9 % — ABNORMAL LOW (ref 35.0–47.0)
HEMOGLOBIN: 9.9 g/dL — AB (ref 12.0–16.0)
MCH: 29.1 pg (ref 26.0–34.0)
MCHC: 33.3 g/dL (ref 32.0–36.0)
MCV: 87.3 fL (ref 80.0–100.0)
PLATELETS: 175 10*3/uL (ref 150–440)
RBC: 3.42 MIL/uL — AB (ref 3.80–5.20)
RDW: 17 % — ABNORMAL HIGH (ref 11.5–14.5)
WBC: 6.1 10*3/uL (ref 3.6–11.0)

## 2016-09-11 MED ORDER — AMOXICILLIN-POT CLAVULANATE 500-125 MG PO TABS
1.0000 | ORAL_TABLET | Freq: Every day | ORAL | Status: DC
  Administered 2016-09-11 – 2016-09-13 (×3): 500 mg via ORAL
  Filled 2016-09-11 (×4): qty 1

## 2016-09-11 MED ORDER — EPOETIN ALFA 10000 UNIT/ML IJ SOLN
10000.0000 [IU] | INTRAMUSCULAR | Status: DC
  Administered 2016-09-11 – 2016-09-14 (×2): 10000 [IU] via INTRAVENOUS

## 2016-09-11 MED ORDER — AMLODIPINE BESYLATE 5 MG PO TABS
5.0000 mg | ORAL_TABLET | Freq: Every day | ORAL | Status: DC
  Administered 2016-09-12: 5 mg via ORAL
  Filled 2016-09-11: qty 1

## 2016-09-11 MED ORDER — WARFARIN SODIUM 5 MG PO TABS: 5.0000 mg | ORAL_TABLET | Freq: Once | ORAL | Status: DC

## 2016-09-11 MED ORDER — FUROSEMIDE 40 MG PO TABS
40.0000 mg | ORAL_TABLET | Freq: Two times a day (BID) | ORAL | Status: DC
  Administered 2016-09-11 – 2016-09-14 (×5): 40 mg via ORAL
  Filled 2016-09-11 (×5): qty 1

## 2016-09-11 MED ORDER — WARFARIN SODIUM 7.5 MG PO TABS
7.5000 mg | ORAL_TABLET | Freq: Once | ORAL | Status: AC
  Administered 2016-09-11: 7.5 mg via ORAL
  Filled 2016-09-11: qty 1

## 2016-09-11 NOTE — Progress Notes (Signed)
Post hd assessment 

## 2016-09-11 NOTE — Progress Notes (Signed)
Patient has returned from dialysis went to room to work with her with her flutter valve.  Patient is currently on the phone and states that she has been using it.

## 2016-09-11 NOTE — Progress Notes (Signed)
Went to patient's room to do CPT(flutter valve) with patient.  Patient currently not available has left the floor to go to dialysis.

## 2016-09-11 NOTE — Progress Notes (Signed)
Pre hd 

## 2016-09-11 NOTE — Progress Notes (Signed)
Pre hd info 

## 2016-09-11 NOTE — Progress Notes (Signed)
HD initiated via L chest hd cath without issue. Patient currently has no complaints.

## 2016-09-11 NOTE — Progress Notes (Signed)
Post hd vitals 

## 2016-09-11 NOTE — Progress Notes (Addendum)
ANTICOAGULATION CONSULT NOTE - Initial Consult  Pharmacy Consult for warfarin Indication: atrial fibrillation  Allergies  Allergen Reactions  . Gabapentin Other (See Comments)    Seizure  . Adhesive [Tape] Itching    Silk tape is ok to use.    Patient Measurements: Height: 5\' 4"  (162.6 cm) Weight: 127 lb 3.3 oz (57.7 kg) IBW/kg (Calculated) : 54.7  Vital Signs: Temp: 98.5 F (36.9 C) (09/05 0408) Temp Source: Oral (09/05 0408) BP: 180/90 (09/05 0408) Pulse Rate: 73 (09/05 0408)  Labs:  Recent Labs  09/09/16 0762  09/09/16 0620 09/09/16 1135 09/09/16 1853 09/10/16 0238 09/11/16 0331  HGB  --   --  13.1  --   --  10.6*  --   HCT  --   --  39.9  --   --  31.1*  --   PLT  --   --  288  --   --  181  --   LABPROT  --   --  20.8*  --   --  21.3* 19.6*  INR  --   --  1.81  --   --  1.86 1.67  CREATININE 11.13*  --   --   --   --  10.52*  --   TROPONINI  --   < > 0.04* 0.05* 0.09* 0.06*  --   < > = values in this interval not displayed.  Estimated Creatinine Clearance: 4.8 mL/min (A) (by C-G formula based on SCr of 10.52 mg/dL (H)).   Medical History: Past Medical History:  Diagnosis Date  . Anemia   . Anxiety   . Atrial fibrillation ([place])   . Breast cancer ([place]) 01/2016   bilateral  . CHF (congestive heart failure) ([place])   . Chronic kidney disease   . Depression   . Dialysis patient ([place])   . DVT (deep venous thrombosis) ([facility])    left leg  . DVT (deep venous thrombosis) ([place]) 1985   right thigh  . Dysrhythmia   . Gout   . Headache   . HTN (hypertension)   . Hypertension   . Parathyroid abnormality ([place])   . Parathyroid disease ([place])   . Pneumonia 12/2015  . Psoriasis   . Renal insufficiency   . Sickle cell trait ([facility])    traits    Assessment: 61 year old patient on warfarin for atrial fibrillation.   Patient's home regimen:  Warfarin is 7.5mg  M,Tu,W,Th                                               Warfarin 5 mg on Fri, Sat, Sun.    Date INR Dose  9/3 1.81 5mg   9/4 1.86 3mg   9/5 1.69  Goal of Therapy:  INR 2-3 Monitor platelets by anticoagulation protocol: Yes   Plan:  INR subtherapeutic this AM. Will give warfarin 7.5 mg x 1 dose this evening.   Will continue to follow up with INRs daily.   Pernell Dupre, PharmD, BCPS Clinical Pharmacist 09/11/2016 7:39 AM

## 2016-09-11 NOTE — Progress Notes (Signed)
  End of hd 

## 2016-09-11 NOTE — Progress Notes (Signed)
[facility]  ROUNDING NOTE   Subjective:   Seen and examined on hemodialysis. Tolerating treatment well. UF of 2 liters. Using profile 3 ultrafiltration.     HEMODIALYSIS FLOWSHEET:  Blood Flow Rate (mL/min): 400 mL/min Arterial Pressure (mmHg): -190 mmHg Venous Pressure (mmHg): 170 mmHg Transmembrane Pressure (mmHg): 60 mmHg Ultrafiltration Rate (mL/min): 690 mL/min Dialysate Flow Rate (mL/min): 600 ml/min Conductivity: Machine : 14 Conductivity: Machine : 14 Dialysis Fluid Bolus: Normal Saline Bolus Amount (mL): 250 mL Dialysate Change:  (3k 2.5ca)  Objective:  Vital signs in last 24 hours:  Temp:  [97.9 F (36.6 C)-98.9 F (37.2 C)] 98.7 F (37.1 C) (09/05 0935) Pulse Rate:  [60-91] 73 (09/05 1159) Resp:  [13-22] 15 (09/05 1159) BP: (136-212)/(79-117) 147/99 (09/05 1159) SpO2:  [94 %-100 %] 100 % (09/05 1159) Weight:  [59.2 kg (130 lb 8.2 oz)] 59.2 kg (130 lb 8.2 oz) (09/05 0935)  Weight change:  Filed Weights   09/09/16 0617 09/09/16 0815 09/11/16 0935  Weight: 57.8 kg (127 lb 8 oz) 57.7 kg (127 lb 3.3 oz) 59.2 kg (130 lb 8.2 oz)    Intake/Output: I/O last 3 completed shifts: In: 46 [P.O.:390; I.V.:117; IV Piggyback:100] Out: 350 [Urine:350]   Intake/Output this shift:  Total I/O In: 120 [P.O.:120] Out: 0   Physical Exam: General: Laying in bed  Head: [place]/AT, Hearing intact   Eyes: Anicteric  Neck: Supple, trachea midline  Lungs:  Right upper quadrant rales  Heart: regular  Abdomen:  Soft, nontender  Extremities: No peripheral edema.  Neurologic: Nonfocal, moving all four extremities  Skin: No lesions  Access: Left IJ permcath    Basic Metabolic Panel:  Recent Labs Lab 09/09/16 0619 09/09/16 1135 09/10/16 0238 09/11/16 0943  NA 141  --  138 139  K 6.1*  --  4.7 4.8  CL 107  --  103 102  CO2 20*  --  22 24  GLUCOSE 112*  --  89 117*  BUN 72*  --  68* 78*  CREATININE 11.13*  --  10.52* 13.27*  CALCIUM 9.5  --  8.4* 8.7*   MG  --   --  2.3  --   PHOS  --  7.5*  --  7.3*    Liver Function Tests:  Recent Labs Lab 09/09/16 0619 09/11/16 0943  AST 35  --   ALT 28  --   ALKPHOS 98  --   BILITOT 0.8  --   PROT 8.0  --   ALBUMIN 4.0 3.2*   No results for input(s): LIPASE, AMYLASE in the last 168 hours. No results for input(s): AMMONIA in the last 168 hours.  CBC:  Recent Labs Lab 09/09/16 0620 09/10/16 0238 09/11/16 0944  WBC 12.9* 7.4 6.1  HGB 13.1 10.6* 9.9*  HCT 39.9 31.1* 29.9*  MCV 88.4 86.7 87.3  PLT 288 181 175    Cardiac Enzymes:  Recent Labs Lab 09/09/16 0620 09/09/16 1135 09/09/16 1853 09/10/16 0238  TROPONINI 0.04* 0.05* 0.09* 0.06*    BNP: Invalid input(s): POCBNP  CBG:  Recent Labs Lab 09/09/16 0805  GLUCAP 101*    Microbiology: Results for orders placed or performed during the hospital encounter of 09/09/16  Culture, blood (routine x 2)     Status: None (Preliminary result)   Collection Time: 09/09/16  6:58 AM  Result Value Ref Range Status   Specimen Description BLOOD RIGHT ANTECUBITAL  Final   Special Requests   Final    BOTTLES DRAWN AEROBIC  AND ANAEROBIC Blood Culture adequate volume   Culture NO GROWTH 2 DAYS  Final   Report Status PENDING  Incomplete  Culture, blood (routine x 2)     Status: None (Preliminary result)   Collection Time: 09/09/16  7:03 AM  Result Value Ref Range Status   Specimen Description BLOOD BLOOD RIGHT WRIST  Final   Special Requests   Final    BOTTLES DRAWN AEROBIC AND ANAEROBIC Blood Culture adequate volume   Culture NO GROWTH 2 DAYS  Final   Report Status PENDING  Incomplete    Coagulation Studies:  Recent Labs  09/09/16 0620 09/10/16 0238 09/11/16 0331  LABPROT 20.8* 21.3* 19.6*  INR 1.81 1.86 1.67    Urinalysis:  Recent Labs  09/09/16 1521  COLORURINE STRAW*  LABSPEC 1.008  PHURINE 8.0  GLUCOSEU 50*  HGBUR NEGATIVE  BILIRUBINUR NEGATIVE  KETONESUR NEGATIVE  PROTEINUR 100*  NITRITE NEGATIVE   LEUKOCYTESUR NEGATIVE      Imaging: No results found.   Medications:   . sodium chloride     . amLODipine  5 mg Oral Daily  . cinacalcet  60 mg Oral TID WC  . feeding supplement (ENSURE ENLIVE)  237 mL Oral Daily  . fluticasone  1 spray Each Nare Daily  . furosemide  60 mg Intravenous Q12H  . heparin  5,000 Units Subcutaneous Q8H  . hydrALAZINE  50 mg Oral BID  . losartan  100 mg Oral Daily  . metoprolol tartrate  100 mg Oral Once per day on Mon Wed Fri  . metoprolol tartrate  50 mg Oral 2 times per day on Sun Tue Thu Sat  . pantoprazole  40 mg Oral Daily  . sertraline  150 mg Oral Daily  . sevelamer carbonate  2,400 mg Oral TID WC  . sodium chloride flush  3 mL Intravenous Q12H  . warfarin  7.5 mg Oral ONCE-1800  . Warfarin - Pharmacist Dosing Inpatient   Does not apply q1800  . zolpidem  5 mg Oral QHS   sodium chloride, acetaminophen **OR** acetaminophen, hydrALAZINE, ondansetron **OR** ondansetron (ZOFRAN) IV, senna-docusate, sodium chloride, sodium chloride flush, traMADol  Assessment/ Plan:  Ms. Solenne Manwarren is a 61 y.o. black female with ESRD on hemodialysis, breast cancer status post mastectomy, congestive heart failure, depression, DVT, seizure disorder, anemia, sickle cell trait, hypertension, gout, atrial fibrillation  [place]. MWF  1. End Stage Renal Disease: Seen and examined on hemodialysis. Tolerating treatment well.  - MWF schedule.   2. Acute Respiratory Failure: on BIPAP on admission, now on [place] O2. With RUL pneumonia on examination and CXR. WBC improved. Afebrile.  - continue [place] O2 - Continue supportive care.   3. Hypertension: elevated diastolic. Off nicardipine gtt. Nitro gave headache Echocardiogram with systolic and diastolic congestive heart failure EF 30-35% and moderate mitral valve regurg.  History of atrial fibrillation on warfarin.  - work up with 24 hour metanephrines, catecholamine and 5-HIAA - restarted home regimen of  losartan and metoprolol. Started on hydralazine on this admission - IV furosemide 60mg  IV q12. Transition to PO.  - Start amlodipine 5mg  daily.   4. Anemia of chronic kidney disease: Hemoglobin 9.9 - EPO with HD treatment  5. Secondary Hyperparathyroidism with hyperphosphatemia. Outpatient phos 6.9. PTH and calcium at goal.  - Continue sevelamer. - Continue cinacalcet   LOS: [place], Ghassan Coggeshall 9/5/201812:09 PM

## 2016-09-11 NOTE — Progress Notes (Signed)
Patient ID: Teresa Cook, female   DOB: 08/17/1955, 61 y.o.   MRN: [id number]   [facility] PROGRESS NOTE  Teresa Cook YKZ:[id number] DOB: 06-29-1955 DOA: 09/09/2016 PCP: Murlean Iba, MD  HPI/Subjective: Patient had an episode where she had felt like her face and neck was swollen last night where she had a lump. She had felt trembly and started sweating. Her right side of her face felt numb. Her blood pressure was high. Her felt like her nose got stopped up.  Objective: Vitals:   09/11/16 1238 09/11/16 1244  BP: (!) 152/96 (!) 169/99  Pulse: 72 68  Resp: 18 20  Temp: 98.5 F (36.9 C)   SpO2: 99% 100%    Filed Weights   09/09/16 0617 09/09/16 0815 09/11/16 0935  Weight: 57.8 kg (127 lb 8 oz) 57.7 kg (127 lb 3.3 oz) 59.2 kg (130 lb 8.2 oz)    ROS: Review of Systems  Constitutional: Negative for chills and fever.  Eyes: Negative for blurred vision.  Respiratory: Positive for shortness of breath. Negative for cough.   Cardiovascular: Negative for chest pain.  Gastrointestinal: Positive for nausea. Negative for abdominal pain, constipation, diarrhea and vomiting.  Genitourinary: Negative for dysuria.  Musculoskeletal: Negative for joint pain.  Neurological: Positive for headaches. Negative for dizziness.   Exam: Physical Exam  Constitutional: She is oriented to person, place, and time.  HENT:  Nose: No mucosal edema.  Mouth/Throat: No oropharyngeal exudate or posterior oropharyngeal edema.  Eyes: Pupils are equal, round, and reactive to light. Conjunctivae, EOM and lids are normal.  Neck: No JVD present. Carotid bruit is not present. No edema present. No thyroid mass and no thyromegaly present.  Cardiovascular: S1 normal and S2 normal.  Exam reveals no gallop.   No murmur heard. Pulses:      Dorsalis pedis pulses are 2+ on the right side, and 2+ on the left side.  Respiratory: No respiratory distress. She has decreased breath sounds in the right lower field and  the left lower field. She has no wheezes. She has no rhonchi. She has no rales.  GI: Soft. Bowel sounds are normal. There is no tenderness.  Musculoskeletal:       Right ankle: She exhibits no swelling.       Left ankle: She exhibits no swelling.  Lymphadenopathy:    She has no cervical adenopathy.  Neurological: She is alert and oriented to person, place, and time. No cranial nerve deficit.  Skin: Skin is warm. No rash noted. Nails show no clubbing.  Psychiatric: She has a normal mood and affect.      Data Reviewed: Basic Metabolic Panel:  Recent Labs Lab 09/09/16 0619 09/09/16 1135 09/10/16 0238 09/11/16 0943  NA 141  --  138 139  K 6.1*  --  4.7 4.8  CL 107  --  103 102  CO2 20*  --  22 24  GLUCOSE 112*  --  89 117*  BUN 72*  --  68* 78*  CREATININE 11.13*  --  10.52* 13.27*  CALCIUM 9.5  --  8.4* 8.7*  MG  --   --  2.3  --   PHOS  --  7.5*  --  7.3*   Liver Function Tests:  Recent Labs Lab 09/09/16 0619 09/11/16 0943  AST 35  --   ALT 28  --   ALKPHOS 98  --   BILITOT 0.8  --   PROT 8.0  --   ALBUMIN 4.0 3.2*  CBC:  Recent Labs Lab 09/09/16 0620 09/10/16 0238 09/11/16 0944  WBC 12.9* 7.4 6.1  HGB 13.1 10.6* 9.9*  HCT 39.9 31.1* 29.9*  MCV 88.4 86.7 87.3  PLT 288 181 175   Cardiac Enzymes:  Recent Labs Lab 09/09/16 0620 09/09/16 1135 09/09/16 1853 09/10/16 0238  TROPONINI 0.04* 0.05* 0.09* 0.06*   BNP (last 3 results)  Recent Labs  01/15/16 2106 08/21/16 0818  BNP 281.0* 2,034.0*     CBG:  Recent Labs Lab 09/09/16 0805  GLUCAP 101*      Scheduled Meds: . amLODipine  5 mg Oral Daily  . cinacalcet  60 mg Oral TID WC  . [START ON 09/13/2016] epoetin (EPOGEN/PROCRIT) injection  10,000 Units Intravenous Q M,W,F-HD  . feeding supplement (ENSURE ENLIVE)  237 mL Oral Daily  . fluticasone  1 spray Each Nare Daily  . furosemide  40 mg Oral BID  . heparin  5,000 Units Subcutaneous Q8H  . hydrALAZINE  50 mg Oral BID  . losartan   100 mg Oral Daily  . metoprolol tartrate  100 mg Oral Once per day on Mon Wed Fri  . metoprolol tartrate  50 mg Oral 2 times per day on Sun Tue Thu Sat  . pantoprazole  40 mg Oral Daily  . sertraline  150 mg Oral Daily  . sevelamer carbonate  2,400 mg Oral TID WC  . sodium chloride flush  3 mL Intravenous Q12H  . warfarin  7.5 mg Oral ONCE-1800  . Warfarin - Pharmacist Dosing Inpatient   Does not apply q1800  . zolpidem  5 mg Oral QHS   Continuous Infusions: . sodium chloride      Assessment/Plan:  1. Acute hypoxic respiratory failure.   Patient required BiPAP on presentation to the ICU. Now breathing comfortably on room air. 2. Accelerated hypertension. Add Norvasc 5 mg. Continue metoprolol, hydralazine, losartan. Check 24-hour urine for metanephrines, 5-HIAA and catecholamines 3. 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 Dialysis to remove fluid.  IV Lasix ordered. On metoprolol, losartan 4. Possible pneumonia on vancomycin and Zosyn initially switch over to Augmentin daily at bedtime 5. End-stage renal disease on dialysis as per nephrology 6. History of DVT and Atrial fibrillation on Coumadin. INR 1.67 today. Continue pharmacy dosing of Coumadin. 7. Depression on Zoloft   Code Status:     Code Status Orders        Start     Ordered   09/09/16 0806  Full code  Continuous     09/09/16 0805    Code Status History    Date Active Date Inactive Code Status Order ID Comments User Context   08/21/2016 11:02 AM 08/23/2016  8:29 PM Full Code [id number]  [name], MD Inpatient   02/29/2016  5:22 PM 03/01/2016  8:55 PM DNR [id number]  Bettey Costa, MD Inpatient   02/29/2016  5:04 PM 02/29/2016  5:22 PM Full Code [id number]  Bettey Costa, MD Inpatient   01/23/2016  6:33 PM 01/29/2016  9:45 PM Full Code [id number]  Hubbard Robinson, MD Inpatient   01/22/2016  3:43 PM 01/23/2016  6:33 PM Full Code [id number]  Dustin Flock, MD Inpatient   12/31/2015   9:39 PM 01/03/2016  9:31 PM Full Code [id number]  [name], MD Inpatient   12/15/2015 10:40 PM 12/28/2015  6:52 PM Full Code [id number]  Harvie Bridge, DO Inpatient   08/31/2015  4:26 AM 09/02/2015  2:35 PM Full Code [id number]  Rosilyn Mings  S, MD Inpatient   08/27/2015  4:06 AM 08/29/2015  3:57 PM Full Code [id number]  Saundra Shelling, MD ED   07/09/2015  2:44 PM 07/12/2015  7:55 PM Full Code [id number]  Laverle Hobby, MD ED   09/19/2014 11:47 AM 09/20/2014  6:25 PM Full Code [id number]  Dustin Flock, MD ED   08/29/2014  2:28 PM 08/30/2014  5:43 PM Full Code [id number]  Fritzi Mandes, MD Inpatient    Advance Directive Documentation     Most Recent Value  Type of Advance Directive  Healthcare Power of Attorney  Pre-existing out of facility DNR order (yellow form or pink MOST form)  -  "MOST" Form in Place?  -     Disposition Plan: hopefully home in the next day or so  Consultants:  Critical care specialist  nephrology  Antibiotics:  Augmentin  Time spent: 25 minutes  [place], [place]

## 2016-09-12 ENCOUNTER — Encounter: Payer: Self-pay | Admitting: Physician Assistant

## 2016-09-12 DIAGNOSIS — R7989 Other specified abnormal findings of blood chemistry: Secondary | ICD-10-CM

## 2016-09-12 DIAGNOSIS — R748 Abnormal levels of other serum enzymes: Secondary | ICD-10-CM

## 2016-09-12 DIAGNOSIS — I10 Essential (primary) hypertension: Secondary | ICD-10-CM

## 2016-09-12 DIAGNOSIS — R0602 Shortness of breath: Secondary | ICD-10-CM

## 2016-09-12 DIAGNOSIS — J810 Acute pulmonary edema: Secondary | ICD-10-CM

## 2016-09-12 DIAGNOSIS — N186 End stage renal disease: Secondary | ICD-10-CM

## 2016-09-12 DIAGNOSIS — R778 Other specified abnormalities of plasma proteins: Secondary | ICD-10-CM

## 2016-09-12 DIAGNOSIS — Z992 Dependence on renal dialysis: Secondary | ICD-10-CM

## 2016-09-12 DIAGNOSIS — I161 Hypertensive emergency: Secondary | ICD-10-CM

## 2016-09-12 DIAGNOSIS — J189 Pneumonia, unspecified organism: Secondary | ICD-10-CM

## 2016-09-12 DIAGNOSIS — I160 Hypertensive urgency: Secondary | ICD-10-CM

## 2016-09-12 DIAGNOSIS — I428 Other cardiomyopathies: Secondary | ICD-10-CM

## 2016-09-12 LAB — PROTIME-INR
INR: 1.52
PROTHROMBIN TIME: 18.2 s — AB (ref 11.4–15.2)

## 2016-09-12 LAB — URINE CULTURE

## 2016-09-12 LAB — TSH: TSH: 1.361 u[IU]/mL (ref 0.350–4.500)

## 2016-09-12 MED ORDER — CALCIUM CARBONATE ANTACID 500 MG PO CHEW
2.0000 | CHEWABLE_TABLET | Freq: Three times a day (TID) | ORAL | Status: DC
  Administered 2016-09-12 (×2): 400 mg via ORAL
  Filled 2016-09-12 (×2): qty 2

## 2016-09-12 MED ORDER — MAGNESIUM SULFATE 2 GM/50ML IV SOLN: 2.0000 g | Freq: Once | INTRAVENOUS | Status: DC

## 2016-09-12 MED ORDER — ISOSORBIDE MONONITRATE ER 30 MG PO TB24
30.0000 mg | ORAL_TABLET | Freq: Every day | ORAL | Status: DC
  Administered 2016-09-12 – 2016-09-13 (×2): 30 mg via ORAL
  Filled 2016-09-12 (×3): qty 1

## 2016-09-12 MED ORDER — HYDRALAZINE HCL 50 MG PO TABS
50.0000 mg | ORAL_TABLET | Freq: Three times a day (TID) | ORAL | Status: DC
  Administered 2016-09-12 – 2016-09-14 (×5): 50 mg via ORAL
  Filled 2016-09-12 (×6): qty 1

## 2016-09-12 MED ORDER — METOPROLOL SUCCINATE ER 100 MG PO TB24
100.0000 mg | ORAL_TABLET | Freq: Every day | ORAL | Status: DC
  Administered 2016-09-13: 100 mg via ORAL
  Filled 2016-09-12 (×2): qty 1

## 2016-09-12 MED ORDER — SODIUM CHLORIDE 0.9 % IV SOLN
1.0000 g | Freq: Once | INTRAVENOUS | Status: AC
  Administered 2016-09-12: 1 g via INTRAVENOUS
  Filled 2016-09-12: qty 10

## 2016-09-12 MED ORDER — WARFARIN SODIUM 7.5 MG PO TABS
7.5000 mg | ORAL_TABLET | Freq: Once | ORAL | Status: AC
  Administered 2016-09-12: 7.5 mg via ORAL
  Filled 2016-09-12: qty 1

## 2016-09-12 MED ORDER — SODIUM CHLORIDE 0.9 % IV SOLN
Freq: Once | INTRAVENOUS | Status: AC
  Administered 2016-09-12: 11:00:00 via INTRAVENOUS
  Filled 2016-09-12: qty 50

## 2016-09-12 NOTE — Consult Note (Signed)
Cardiology Consultation Note  Patient ID: Teresa Cook, MRN: [id number], DOB/AGE: November 22, 1955 61 y.o. Admit date: 09/09/2016   Date of Consult: 09/12/2016 Primary Physician: Murlean Iba, MD Primary Cardiologist: Former [facility] patient Requesting Physician: Dr. Leslye Peer, MD  Chief Complaint: SOB Reason for Consult: NSVT  HPI: Teresa Cook is a 61 y.o. female who is being seen today for the evaluation of NSVT at the request of Dr. Leslye Peer, MD. Patient has a h/o chronic combined CHF, ESRD on dialysis MWF, prior DVT, HTN, headache disorder, and breast cancer s/p bilateral mastectomy s/p neoadjuvant chemotherapy with Taxotere and Cytoxan who presented to [facility] on 9/3 with SOB, chest pressure, and indigestion.   Seen for pre-operative cardiac evaluation on 01/18/2016. At that time it was noted patient has been without chest pain and only with some SOB with volume overload. Her volume status is managed through dialysis secondary to ESRD. She was noted at that time to have a functional capacity of at least 4 METs. Nuclear stress test in 08/2015 low risk with EF estimated at 55-65%. Prior echo in 07/2015 showed EF of 40-45%, mild diffuse hypokinesis, grade 1 diastolic dysfunction, mild mitral regurgitation, trivial tricuspid regurgitation, normal right ventricle size and function, right-side pressure normal. Most recent echo from 01/18/2016 showed no significant change from prior with a stable EF of 40-45%, diffuse hypokinesis, grade 1 diastolic dysfunction, mild to moderate mitral regurgitation, and moderate tricuspid regurgitation.   She underwent successful bilateral mastectomy in 01/2016. She was admitted to [facility] in mid August with acute respiratory failure with hypoxia felt to be 2/2 pulmonary edema and volume overload in the setting of accelerated HTN. She initially required BiPAP and was weaned. Respiratory status improved with urgent HD.   There are reports of Afib in the patient's chart since she was  last evaluated in our office in 01/2016, though there is no documentation/EKG to support this.   Patient was admitted on 9/3 with acute hypoxic respiratory failure 2/2 possible PNA, volume overload with acute on chronic combined CHF, and accelerated HTN. She initially required BiPAP and has since been weaned to room air. She underwent HD with improvement in her symptoms, though still notes some chest pressure and indigestion. She has not missed any days of her HD and reports full sessions. She is uncertain of her dry weight. Labs showed a mildly elevated troponin with a peak of 0.09, down trending. Overnight, she had 9 beats of NSVT at 3:06 AM and 29 beats of VT at 3:07 AM while sleeping. Most recent magnesium from 09/10/2016 of 2.3. Echo from 09/10/2016 showed EF 30-35%, diffuse HK, unable to exclude RWMA, GR2DD, mild AI, moderate MR, mildly dilated LA, RV systolic function normal, PASP 55 mmHg. She was given IV magnesium this morning and cardiology was asked to see.     Past Medical History:  Diagnosis Date  . Anemia   . Anxiety   . Breast cancer ([place]) 01/2016   bilateral  . CHF (congestive heart failure) ([place])   . Chronic kidney disease   . Depression   . Dialysis patient ([place])   . DVT (deep venous thrombosis) (HCC)    left leg  . DVT (deep venous thrombosis) ([place]) 1985   right thigh  . Dysrhythmia   . Gout   . Headache   . HTN (hypertension)   . Hypertension   . Parathyroid abnormality ([place])   . Parathyroid disease ([place])   . Pneumonia 12/2015  . Psoriasis   . Renal insufficiency   .  Sickle cell trait ([place])    traits      Most Recent Cardiac Studies: Echo 09/10/2016: Study Conclusions  - Left ventricle: The cavity size was normal. There was moderate   concentric hypertrophy. Systolic function was moderately to   severely reduced. The estimated ejection fraction was in the   range of 30% to 35%. Diffuse hypokinesis. Regional wall motion   abnormalities cannot be excluded.  Features are consistent with a   pseudonormal left ventricular filling pattern, with concomitant   abnormal relaxation and increased filling pressure (grade 2   diastolic dysfunction). - Aortic valve: There was mild regurgitation. - Mitral valve: There was moderate regurgitation. - Left atrium: The atrium was mildly dilated. - Right ventricle: Systolic function was normal. - Pulmonary arteries: Systolic pressure was moderately elevated. PA   peak pressure: 55 mm Hg (S). - Pericardium, extracardiac: A trivial pericardial effusion was   identified.  Echo 01/2016: Study Conclusions  - Left ventricle: The cavity size was normal. Wall thickness was   increased in a pattern of mild LVH. Systolic function was mildly   to moderately reduced. The estimated ejection fraction was in the   range of 40% to 45%. Diffuse hypokinesis. Doppler parameters are   consistent with abnormal left ventricular relaxation (grade 1   diastolic dysfunction). Doppler parameters are consistent with   elevated ventricular end-diastolic filling pressure. - Aortic valve: There was mild regurgitation by color doppler but   no available spectral doppler for review. - Mitral valve: There was mild to moderate regurgitation. - Tricuspid valve: There was moderate regurgitation.  Nuclear stress test 08/2015: Study Result   Blood pressure demonstrated a normal response to exercise.  There was no ST segment deviation noted during stress.  The study is normal.  This is a low risk study.  The left ventricular ejection fraction is normal (55-65%).     Surgical History:  Past Surgical History:  Procedure Laterality Date  . ABDOMINAL HYSTERECTOMY  1980  . APPENDECTOMY    . BREAST BIOPSY Left 10/28/2013   benign  . BREAST EXCISIONAL BIOPSY Left 2002   benign  . INSERTION OF DIALYSIS CATHETER  2014  . LIPOMA EXCISION N/A 01/23/2016   Procedure: EXCISION LIPOMA;  Surgeon: Hubbard Robinson, MD;  Location: [facility] ORS;   Service: General;  Laterality: N/A;  . MASTECTOMY W/ SENTINEL NODE BIOPSY Bilateral 01/23/2016   Procedure: bilateral MASTECTOMY WITH  bilateral SENTINEL LYMPH NODE BIOPSY possible left axillary node dissection forehead lipoma removal;  Surgeon: Hubbard Robinson, MD;  Location: [facility] ORS;  Service: General;  Laterality: Bilateral;  . PARTIAL HYSTERECTOMY    . PERIPHERAL VASCULAR CATHETERIZATION N/A 12/25/2015   Procedure: Dialysis/Perma Catheter Insertion;  Surgeon: Algernon Huxley, MD;  Location: [place] CV LAB;  Service: Cardiovascular;  Laterality: N/A;  . PERIPHERAL VASCULAR CATHETERIZATION Left 01/22/2016   Procedure: Dialysis/Perma Catheter Insertion;  Surgeon: Algernon Huxley, MD;  Location: [place] [facility];  Service: Cardiovascular;  Laterality: Left;  . PERIPHERAL VASCULAR CATHETERIZATION N/A 01/26/2016   Procedure: Dialysis/Perma Catheter Insertion;  Surgeon: Katha Cabal, MD;  Location: [place] [facility];  Service: Cardiovascular;  Laterality: N/A;  . PORT-A-CATH REMOVAL N/A 12/20/2015   Procedure: REMOVAL PORT-A-CATH;  Surgeon: Hubbard Robinson, MD;  Location: [facility] ORS;  Service: General;  Laterality: N/A;  left    . PORTACATH PLACEMENT Left 08/21/2015   Procedure: INSERTION PORT-A-CATH;  Surgeon: Hubbard Robinson, MD;  Location: [facility] ORS;  Service: General;  Laterality: Left;  . REMOVAL OF A DIALYSIS CATHETER  2017     Home Meds: Prior to Admission medications   Medication Sig Start Date End Date Taking? Authorizing Provider  acetaminophen (TYLENOL) 325 MG tablet Take 2 tablets (650 mg total) by mouth every 6 (six) hours as needed for mild pain (or Fever >/= 101). 12/27/15  Yes [name], [name], MD  cinacalcet (SENSIPAR) 60 MG tablet Take 60 mg by mouth 3 (three) times daily with meals.   Yes [provider]  letrozole (FEMARA) 2.5 MG tablet Take 1 tablet (2.5 mg total) by mouth daily. 05/07/16  Yes [name], MD  losartan (COZAAR) 100 MG tablet Take 100  mg by mouth 2 (two) times daily.  09/07/14  Yes [provider]  metoprolol (LOPRESSOR) 100 MG tablet Take 0.5 tablets (50 mg total) by mouth 2 (two) times daily. Takes on non-dialysis days=Tuesday, Thursday, Saturday and Sunday. Patient taking differently: Take 100 mg by mouth 2 (two) times daily. Takes 100 MG DAILY on non-dialysis days= Monday, Wednesday, Friday 12/27/15  Yes [name], [name], MD  ondansetron (ZOFRAN) 8 MG tablet Take by mouth every 8 (eight) hours as needed for nausea or vomiting.   Yes [provider]  pantoprazole (PROTONIX) 40 MG tablet Take 1 tablet (40 mg total) by mouth daily. Patient taking differently: Take 40 mg by mouth 2 (two) times daily.  03/01/16  Yes [name], MD  prochlorperazine (COMPAZINE) 10 MG tablet Take 10 mg by mouth every 6 (six) hours as needed for nausea or vomiting.   Yes [provider]  sertraline (ZOLOFT) 50 MG tablet Take 3 tablets (150 mg total) by mouth daily. Patient taking differently: Take 200 mg by mouth daily.  03/01/16  Yes [name], MD  sevelamer carbonate (RENVELA) 800 MG tablet Take 2,400 mg by mouth 3 (three) times daily with meals. Tid with meals and with 1 snack    Yes [provider]  traMADol (ULTRAM) 50 MG tablet Take 1 tablet (50 mg total) by mouth every 6 (six) hours as needed for moderate pain. 08/23/16  Yes [name], MD  warfarin (COUMADIN) 5 MG tablet take 5 mg on friday, saturday, sunday. and 1.5 mg on monday, tuesday, wednesday, thursday 11/03/13  Yes [provider]  zolpidem (AMBIEN) 5 MG tablet Take 1 tablet (5 mg total) by mouth at bedtime. 03/01/16  Yes [name], MD  feeding supplement, ENSURE ENLIVE, (ENSURE ENLIVE) LIQD Take 237 mLs by mouth 3 (three) times daily between meals. Patient taking differently: Take 237 mLs by mouth daily.  12/27/15   [name], MD  UNABLE TO FIND Med Name: Prosthetic Bra with Inserts 04/23/16   [name], MD    Inpatient Medications:  . amLODipine  5 mg Oral Daily  . amoxicillin-clavulanate  1 tablet Oral QHS  . cinacalcet  60 mg Oral TID WC  . [START ON 09/13/2016] epoetin (EPOGEN/PROCRIT) injection  10,000 Units Intravenous Q M,W,F-HD  . feeding supplement (ENSURE ENLIVE)  237 mL Oral Daily  . fluticasone  1 spray Each Nare Daily  . furosemide  40 mg Oral BID  . heparin  5,000 Units Subcutaneous Q8H  . hydrALAZINE  50 mg Oral BID  . losartan  100 mg Oral Daily  . metoprolol tartrate  100 mg Oral Once per day on Mon Wed Fri  . metoprolol tartrate  50 mg Oral 2 times per day on Sun Tue Thu Sat  . pantoprazole  40 mg Oral Daily  . sertraline  150 mg Oral Daily  . sevelamer carbonate  2,400 mg Oral TID WC  . sodium chloride flush  3 mL Intravenous Q12H  . warfarin  7.5 mg Oral ONCE-1800  . Warfarin - Pharmacist Dosing Inpatient   Does not apply q1800  . zolpidem  5 mg Oral QHS   . sodium chloride    . Magnesium Sulfate IVPB 2 gm in 50 ml      Allergies:  Allergies  Allergen Reactions  . Gabapentin Other (See Comments)    Seizure  . Adhesive [Tape] Itching    Silk tape is ok to use.    Social History   Social History  . Marital status: Widowed    Spouse name: N/A  . Number of children: N/A  . Years of education: N/A   Occupational History  . disabled    Social History Main Topics  . Smoking status: Never Smoker  . Smokeless tobacco: Never Used  . Alcohol use No  . Drug use: No  . Sexual activity: No   Other Topics Concern  . Not on file   Social History Narrative   ** Merged History Encounter **         Family History  Problem Relation Age of Onset  . Stroke Mother   . CVA Mother   . Hypertension Mother   . Hypertension Father   . Hypertension Sister   . Diabetes Sister   . Cancer Sister 77       Breast  . Stroke Brother   . Hypertension Brother   . Breast cancer Maternal Aunt 70     Review of Systems: Review of Systems    Constitutional: Positive for malaise/fatigue. Negative for chills, diaphoresis, fever and weight loss.  HENT: Negative for congestion.   Eyes: Negative for discharge and redness.  Respiratory: Positive for shortness of breath. Negative for cough, hemoptysis, sputum production and wheezing.   Cardiovascular: Positive for chest pain and orthopnea. Negative for palpitations, claudication, leg swelling and PND.  Gastrointestinal: Positive for heartburn. Negative for abdominal pain, blood in stool, melena, nausea and vomiting.  Genitourinary: Negative for hematuria.  Musculoskeletal: Negative for falls and myalgias.  Skin: Negative for rash.  Neurological: Positive for weakness. Negative for dizziness, tingling, tremors, sensory change, speech change, focal weakness and loss of consciousness.  Endo/Heme/Allergies: Does not bruise/bleed easily.  Psychiatric/Behavioral: Negative for substance abuse. The patient is not nervous/anxious.   All other systems reviewed and are negative.   Labs:  Recent Labs  09/09/16 1135 09/09/16 1853 09/10/16 0238  TROPONINI 0.05* 0.09* 0.06*   Lab Results  Component Value Date   WBC 6.1 09/11/2016   HGB 9.9 (L) 09/11/2016   HCT 29.9 (L) 09/11/2016   MCV 87.3 09/11/2016   PLT 175 09/11/2016    Recent Labs Lab 09/09/16 0619  09/11/16 0943  NA 141  < > 139  K 6.1*  < > 4.8  CL 107  < > 102  CO2 20*  < > 24  BUN 72*  < > 78*  CREATININE 11.13*  < > 13.27*  CALCIUM 9.5  < > 8.7*  PROT 8.0  --   --   BILITOT 0.8  --   --   ALKPHOS 98  --   --   ALT 28  --   --   AST 35  --   --   GLUCOSE 112*  < > 117*  < > =  values in this interval not displayed. Lab Results  Component Value Date   CHOL 250 (H) 02/03/2014   HDL 35 (L) 02/03/2014   LDLCALC 186 (H) 02/03/2014   TRIG 144 02/03/2014   No results found for: DDIMER  Radiology/Studies:  Dg Chest Portable 1 View  Result Date: 09/09/2016 IMPRESSION: New multifocal airspace opacities, greatest  in the right upper lobe where there is confluent consolidation. This may represent pneumonia. Asymmetric alveolar edema is also a possibility. Electronically Signed   By: Andreas Newport M.D.   On: 09/09/2016 06:42     EKG: Interpreted by me showed: NSR, 73 bpm, LVH with early repolarization, TWI I, II, aVL, V3-V6 Telemetry: Interpreted by me showed: NSR, 80s bpm, 29 beats VT at 3:07 AM, 9 beats NSVT 3:06 AM  Weights: Filed Weights   09/09/16 0617 09/09/16 0815 09/11/16 0935  Weight: 127 lb 8 oz (57.8 kg) 127 lb 3.3 oz (57.7 kg) 130 lb 8.2 oz (59.2 kg)     Physical Exam: Blood pressure (!) 154/76, pulse 77, temperature 98.4 F (36.9 C), temperature source Oral, resp. rate 16, height 5\' 4"  (1.626 m), weight 130 lb 8.2 oz (59.2 kg), SpO2 97 %. Body mass index is 22.4 kg/m. General: Well developed, well nourished, in no acute distress. Head: Normocephalic, atraumatic, sclera non-icteric, no xanthomas, nares are without discharge.  Neck: Negative for carotid bruits. JVD not elevated. Lungs: Clear bilaterally to auscultation without wheezes, rales, or rhonchi. Breathing is unlabored. Heart: RRR with S1 S2. II/VI systolic murmur, no rubs, or gallops appreciated. Abdomen: Soft, non-tender, non-distended with normoactive bowel sounds. No hepatomegaly. No rebound/guarding. No obvious abdominal masses. Msk:  Strength and tone appear normal for age. Extremities: No clubbing or cyanosis. No edema. Distal pedal pulses are 2+ and equal bilaterally. Neuro: Alert and oriented X 3. No facial asymmetry. No focal deficit. Moves all extremities spontaneously. Psych:  Responds to questions appropriately with a normal affect.    Assessment and Plan:  Principal Problem:   Acute respiratory failure with hypoxia (HCC) Active Problems:   Pulmonary edema   ESRD on dialysis ([place])   NICM (nonischemic cardiomyopathy) (HCC)   PNA (pneumonia)   Acute on chronic systolic heart failure (HCC)   Elevated  troponin   Accelerated hypertension   Pressure injury of skin    1. Acute hypoxic respiratory failure with hypoxia: -Likely multifactorial in the setting of PNA, acute on chronic combined CHF, pulmonary hypertension, and accelerated hypertension  -Improved  2. NSVT: -Asymptomatic -New drop in EF to 30-35% from 40-45% in 01/2016 -Magnesium at goal on 09/10/2016 -Status post IV magnesium this morning -Check TSH -Potassium at goal on 9/5 -Given drop in EF and ventricular ectopy, she may benefit from an ischemic evaluation with LHC prior to discharge -INR down trending to 1.52 this morning, consider holding Coumadin this evening and placing for heparin gtt for possible LHC on 9/7 if INR is acceptable. Will discuss with MD  3. Acute on chronic combined systolic and diastolic CHF/NICM/pulmonary hypertension: -Echo this admission showed worsening of LVSF as above -Recommend changing Lopressor to Toprol XL given her cardiomyopathy -Recommend stopping amlodipine given her cardiomyopathy -Add Imdur to hydralazine given her cardiomyopathy -On losartan, defer to renal  -PRN Lasix as she still makes some urine -Volume managed by HD  4. Elevated troponin: -Notes chest pressure and indigestion -Consider ischemic work up as above prior to discharge  -Possible supply demand ischemia in the setting of the patient's volume overload, possible PNA, and  ESRD on HD -Recent low-risk stress test  5. Reported Afib: -This was added to the patient's chart sometime after she was last seen in our office in 01/2016 -Review of 12-lead EKGs does not show any evidence of Afib -She has been continued on Coumadin for prior DVT -Defer management of Coumadin to PCP -Monitor for documented Afib  6. PNA: -Per IM  7. ESRD: -On HD  8. Accelerated HTN: -Improving -Continue current medications  9. Valvular heart disease: -Needs outpatient follow up   Signed, Christell Faith, PA-C [place] Pager: [phone] 09/12/2016, 10:27 AM

## 2016-09-12 NOTE — Progress Notes (Signed)
Nurse paged [facility] to inform that a pt wanted to complete Advanced Directives. CH met with pt, pt's friends and family at bedside. Pt talked about her health struggles, her loss of husband who passed away recently, her faith and church. Pt states she had a great support system. Pt appeared anxious about her prognosis and wanted to complete AD. [place] educated pt on how to complete AD. Since pt was ready to complete AD; [place] contacted [place] and gathered 2 witnesses who witnessed pt complete AD.  CH gave original and 2 copies of AD to pt, and placed a copy in pt's chart. CH also offered prayer for healing to pt and her family.    09/12/16 1500  Clinical Encounter Type  Visited With Patient and family together  Visit Type Initial;Follow-up;Spiritual support;Other (Comment)  Referral From Nurse  Consult/Referral To Chaplain  Spiritual Encounters  Spiritual Needs Brochure  Stress Factors  Patient Stress Factors Health changes  Family Stress Factors Health changes  Advance Directives (For Healthcare)  Does Patient Have a Medical Advance Directive? Yes  Does patient want to make changes to medical advance directive? Yes (Inpatient - patient requests chaplain consult to change a medical advance directive)  Type of Advance Directive [place];Living will  Copy of [place] in Chart? Yes  Copy of Living Will in Chart? Yes  Mental Health Advance Directives  Does Patient Have a Mental Health Advance Directive? No  Does patient want to make changes to mental health advance directive? No - Patient declined  Copy of Mental Health Advance Directive in Chart? No - copy requested  Would patient like information on creating a mental health advance directive? No - Patient declined

## 2016-09-12 NOTE — Progress Notes (Signed)
Patient ID: Sharetta Ricchio, female   DOB: 1955/09/28, 61 y.o.   MRN: [id number]   [employer] PROGRESS NOTE  Takeya Marquis HEN:[id number] DOB: [birth year] DOA: 09/09/2016 PCP: Murlean Iba, MD  HPI/Subjective: Patient still has a feeling in her neck and throat. She gets really short of breath. Some nausea.  Objective: Vitals:   09/12/16 1000 09/12/16 1439  BP: (!) 150/90 (!) 174/90  Pulse:  94  Resp:    Temp:  98.8 F (37.1 C)  SpO2:  96%    Filed Weights   09/09/16 0617 09/09/16 0815 09/11/16 0935  Weight: 57.8 kg (127 lb 8 oz) 57.7 kg (127 lb 3.3 oz) 59.2 kg (130 lb 8.2 oz)    ROS: Review of Systems  Constitutional: Negative for chills and fever.  Eyes: Negative for blurred vision.  Respiratory: Positive for shortness of breath. Negative for cough.   Cardiovascular: Negative for chest pain.  Gastrointestinal: Positive for nausea. Negative for abdominal pain, constipation, diarrhea and vomiting.  Genitourinary: Negative for dysuria.  Musculoskeletal: Negative for joint pain.  Neurological: Positive for headaches. Negative for dizziness.   Exam: Physical Exam  Constitutional: She is oriented to person, place, and time.  HENT:  Nose: No mucosal edema.  Mouth/Throat: No oropharyngeal exudate or posterior oropharyngeal edema.  Eyes: Pupils are equal, round, and reactive to light. Conjunctivae, EOM and lids are normal.  Neck: No JVD present. Carotid bruit is not present. No edema present. No thyroid mass and no thyromegaly present.  Cardiovascular: S1 normal and S2 normal.  Exam reveals no gallop.   No murmur heard. Pulses:      Dorsalis pedis pulses are 2+ on the right side, and 2+ on the left side.  Respiratory: No respiratory distress. She has decreased breath sounds in the right lower field and the left lower field. She has no wheezes. She has no rhonchi. She has no rales.  GI: Soft. Bowel sounds are normal. There is no tenderness.  Musculoskeletal:   Right ankle: She exhibits no swelling.       Left ankle: She exhibits no swelling.  Lymphadenopathy:    She has no cervical adenopathy.  Neurological: She is alert and oriented to person, place, and time. No cranial nerve deficit.  Skin: Skin is warm. No rash noted. Nails show no clubbing.  Psychiatric: She has a normal mood and affect.      Data Reviewed: Basic Metabolic Panel:  Recent Labs Lab 09/09/16 0619 09/09/16 1135 09/10/16 0238 09/11/16 0943  NA 141  --  138 139  K 6.1*  --  4.7 4.8  CL 107  --  103 102  CO2 20*  --  22 24  GLUCOSE 112*  --  89 117*  BUN 72*  --  68* 78*  CREATININE 11.13*  --  10.52* 13.27*  CALCIUM 9.5  --  8.4* 8.7*  MG  --   --  2.3  --   PHOS  --  7.5*  --  7.3*   Liver Function Tests:  Recent Labs Lab 09/09/16 0619 09/11/16 0943  AST 35  --   ALT 28  --   ALKPHOS 98  --   BILITOT 0.8  --   PROT 8.0  --   ALBUMIN 4.0 3.2*   CBC:  Recent Labs Lab 09/09/16 0620 09/10/16 0238 09/11/16 0944  WBC 12.9* 7.4 6.1  HGB 13.1 10.6* 9.9*  HCT 39.9 31.1* 29.9*  MCV 88.4 86.7 87.3  PLT 288 181 175  Cardiac Enzymes:  Recent Labs Lab 09/09/16 0620 09/09/16 1135 09/09/16 1853 09/10/16 0238  TROPONINI 0.04* 0.05* 0.09* 0.06*   BNP (last 3 results)  Recent Labs  01/15/16 2106 08/21/16 0818  BNP 281.0* 2,034.0*     CBG:  Recent Labs Lab 09/09/16 0805  GLUCAP 101*      Scheduled Meds: . amoxicillin-clavulanate  1 tablet Oral QHS  . calcium carbonate  2 tablet Oral TID  . cinacalcet  60 mg Oral TID WC  . [START ON 09/13/2016] epoetin (EPOGEN/PROCRIT) injection  10,000 Units Intravenous Q M,W,F-HD  . feeding supplement (ENSURE ENLIVE)  237 mL Oral Daily  . fluticasone  1 spray Each Nare Daily  . furosemide  40 mg Oral BID  . heparin  5,000 Units Subcutaneous Q8H  . hydrALAZINE  50 mg Oral BID  . isosorbide mononitrate  30 mg Oral Daily  . losartan  100 mg Oral Daily  . [START ON 09/13/2016] metoprolol succinate   100 mg Oral Daily  . pantoprazole  40 mg Oral Daily  . sertraline  150 mg Oral Daily  . sevelamer carbonate  2,400 mg Oral TID WC  . sodium chloride flush  3 mL Intravenous Q12H  . warfarin  7.5 mg Oral ONCE-1800  . Warfarin - Pharmacist Dosing Inpatient   Does not apply q1800  . zolpidem  5 mg Oral QHS   Continuous Infusions: . sodium chloride      Assessment/Plan:  1. Nonsustained ventricular tachycardia. I gave IV magnesium this morning. Case discussed with cardiology and they will consider a cardiac catheterization in the morning. 2. Acute hypoxic respiratory failure.   Patient required BiPAP on presentation to the ICU. Now breathing comfortably on room air. 3. Accelerated hypertension. Added Norvasc 5 mg. Continue metoprolol, hydralazine, losartan. Check 24-hour urine for metanephrines, 5-HIAA and catecholamines. 4. 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 Dialysis to remove fluid.  On metoprolol, losartan, Lasix 5. Possible pneumonia on vancomycin and Zosyn initially switch over to Augmentin daily at bedtime. 6. End-stage renal disease on dialysis as per nephrology 7. History of DVT and Atrial fibrillation on Coumadin. INR 1.52 today. Continue pharmacy dosing of Coumadin. 8. Depression on Zoloft   Code Status:     Code Status Orders        Start     Ordered   09/09/16 0806  Full code  Continuous     09/09/16 0805    Code Status History    Date Active Date Inactive Code Status Order ID Comments User Context   08/21/2016 11:02 AM 08/23/2016  8:29 PM Full Code [id number]  [name], MD Inpatient   02/29/2016  5:22 PM 03/01/2016  8:55 PM DNR [id number]  Bettey Costa, MD Inpatient   02/29/2016  5:04 PM 02/29/2016  5:22 PM Full Code [id number]  Bettey Costa, MD Inpatient   01/23/2016  6:33 PM 01/29/2016  9:45 PM Full Code [id number]  Hubbard Robinson, MD Inpatient   01/22/2016  3:43 PM 01/23/2016  6:33 PM Full Code [id number]  Dustin Flock, MD Inpatient   12/31/2015  9:39 PM 01/03/2016  9:31 PM Full Code [id number]  [name], MD Inpatient   12/15/2015 10:40 PM 12/28/2015  6:52 PM Full Code [id number]  Harvie Bridge, DO Inpatient   08/31/2015  4:26 AM 09/02/2015  2:35 PM Full Code [id number]  Harrie Foreman, MD Inpatient   08/27/2015  4:06 AM 08/29/2015  3:57 PM Full Code [id number]  Pyred[place],  Reatha Harps, MD ED   07/09/2015  2:44 PM 07/12/2015  7:55 PM Full Code [id number]  Laverle Hobby, MD ED   09/19/2014 11:47 AM 09/20/2014  6:25 PM Full Code [id number]  Dustin Flock, MD ED   08/29/2014  2:28 PM 08/30/2014  5:43 PM Full Code [id number]  Fritzi Mandes, MD Inpatient    Advance Directive Documentation     Most Recent Value  Type of Advance Directive  Healthcare Power of Attorney  Pre-existing out of facility DNR order (yellow form or pink MOST form)  -  "MOST" Form in Place?  -     Disposition Plan: home soon depending on whether or not cardiology doesn't cardiac catheterization or not.  Consultants:  Critical care specialist  nephrology  Antibiotics:  Augmentin  Time spent: 25 minutes. Left message for daughter.  Loletha Grayer  [employer]

## 2016-09-12 NOTE — Progress Notes (Signed)
[facility]  ROUNDING NOTE   Subjective:   Hemodialysis yesterday. Tolerated treatment well. UF of 1.5  Run of v-tach last night.   Complains of "head and neck" fullness.   Objective:  Vital signs in last 24 hours:  Temp:  [98.2 F (36.8 C)-98.8 F (37.1 C)] 98.8 F (37.1 C) (09/06 1439) Pulse Rate:  [72-94] 94 (09/06 1439) Resp:  [16-20] 16 (09/06 0530) BP: (150-197)/(76-99) 174/90 (09/06 1439) SpO2:  [96 %-100 %] 96 % (09/06 1439)  Weight change:  Filed Weights   09/09/16 0617 09/09/16 0815 09/11/16 0935  Weight: 57.8 kg (127 lb 8 oz) 57.7 kg (127 lb 3.3 oz) 59.2 kg (130 lb 8.2 oz)    Intake/Output: I/O last 3 completed shifts: In: 373 [P.O.:270; I.V.:3; IV Piggyback:100] Out: 1700 [Urine:200; Other:1500]   Intake/Output this shift:  No intake/output data recorded.  Physical Exam: General: Laying in bed  Head: [place]/AT, Hearing intact   Eyes: Anicteric  Neck: Supple, trachea midline  Lungs:  Right upper quadrant rales  Heart: regular  Abdomen:  Soft, nontender  Extremities: No peripheral edema.  Neurologic: Nonfocal, moving all four extremities  Skin: No lesions  Access: Left IJ permcath    Basic Metabolic Panel:  Recent Labs Lab 09/09/16 0619 09/09/16 1135 09/10/16 0238 09/11/16 0943  NA 141  --  138 139  K 6.1*  --  4.7 4.8  CL 107  --  103 102  CO2 20*  --  22 24  GLUCOSE 112*  --  89 117*  BUN 72*  --  68* 78*  CREATININE 11.13*  --  10.52* 13.27*  CALCIUM 9.5  --  8.4* 8.7*  MG  --   --  2.3  --   PHOS  --  7.5*  --  7.3*    Liver Function Tests:  Recent Labs Lab 09/09/16 0619 09/11/16 0943  AST 35  --   ALT 28  --   ALKPHOS 98  --   BILITOT 0.8  --   PROT 8.0  --   ALBUMIN 4.0 3.2*   No results for input(s): LIPASE, AMYLASE in the last 168 hours. No results for input(s): AMMONIA in the last 168 hours.  CBC:  Recent Labs Lab 09/09/16 0620 09/10/16 0238 09/11/16 0944  WBC 12.9* 7.4 6.1  HGB 13.1 10.6* 9.9*   HCT 39.9 31.1* 29.9*  MCV 88.4 86.7 87.3  PLT 288 181 175    Cardiac Enzymes:  Recent Labs Lab 09/09/16 0620 09/09/16 1135 09/09/16 1853 09/10/16 0238  TROPONINI 0.04* 0.05* 0.09* 0.06*    BNP: Invalid input(s): POCBNP  CBG:  Recent Labs Lab 09/09/16 0805  GLUCAP 101*    Microbiology: Results for orders placed or performed during the hospital encounter of 09/09/16  Culture, blood (routine x 2)     Status: None (Preliminary result)   Collection Time: 09/09/16  6:58 AM  Result Value Ref Range Status   Specimen Description BLOOD RIGHT ANTECUBITAL  Final   Special Requests   Final    BOTTLES DRAWN AEROBIC AND ANAEROBIC Blood Culture adequate volume   Culture NO GROWTH 3 DAYS  Final   Report Status PENDING  Incomplete  Culture, blood (routine x 2)     Status: None (Preliminary result)   Collection Time: 09/09/16  7:03 AM  Result Value Ref Range Status   Specimen Description BLOOD BLOOD RIGHT WRIST  Final   Special Requests   Final    BOTTLES DRAWN AEROBIC AND  ANAEROBIC Blood Culture adequate volume   Culture NO GROWTH 3 DAYS  Final   Report Status PENDING  Incomplete  Urine culture     Status: Abnormal   Collection Time: 09/10/16  3:21 PM  Result Value Ref Range Status   Specimen Description URINE, RANDOM  Final   Special Requests NONE  Final   Culture MULTIPLE SPECIES PRESENT, SUGGEST RECOLLECTION (A)  Final   Report Status 09/12/2016 FINAL  Final    Coagulation Studies:  Recent Labs  09/10/16 0238 09/11/16 0331 09/12/16 0352  LABPROT 21.3* 19.6* 18.2*  INR 1.86 1.67 1.52    Urinalysis:  Recent Labs  09/09/16 1521  COLORURINE STRAW*  LABSPEC 1.008  PHURINE 8.0  GLUCOSEU 50*  HGBUR NEGATIVE  BILIRUBINUR NEGATIVE  KETONESUR NEGATIVE  PROTEINUR 100*  NITRITE NEGATIVE  LEUKOCYTESUR NEGATIVE      Imaging: No results found.   Medications:   . sodium chloride     . amoxicillin-clavulanate  1 tablet Oral QHS  . calcium carbonate  2  tablet Oral TID  . cinacalcet  60 mg Oral TID WC  . [START ON 09/13/2016] epoetin (EPOGEN/PROCRIT) injection  10,000 Units Intravenous Q M,W,F-HD  . feeding supplement (ENSURE ENLIVE)  237 mL Oral Daily  . fluticasone  1 spray Each Nare Daily  . furosemide  40 mg Oral BID  . heparin  5,000 Units Subcutaneous Q8H  . hydrALAZINE  50 mg Oral Q8H  . isosorbide mononitrate  30 mg Oral Daily  . losartan  100 mg Oral Daily  . [START ON 09/13/2016] metoprolol succinate  100 mg Oral Daily  . pantoprazole  40 mg Oral Daily  . sertraline  150 mg Oral Daily  . sevelamer carbonate  2,400 mg Oral TID WC  . sodium chloride flush  3 mL Intravenous Q12H  . warfarin  7.5 mg Oral ONCE-1800  . Warfarin - Pharmacist Dosing Inpatient   Does not apply q1800  . zolpidem  5 mg Oral QHS   sodium chloride, acetaminophen **OR** acetaminophen, hydrALAZINE, ondansetron **OR** ondansetron (ZOFRAN) IV, senna-docusate, sodium chloride, sodium chloride flush, traMADol  Assessment/ Plan:  Ms. Halana Deisher is a 61 y.o. black female with ESRD on hemodialysis, breast cancer status post mastectomy, congestive heart failure, depression, DVT, seizure disorder, anemia, sickle cell trait, hypertension, gout, atrial fibrillation  [place]. MWF  1. End Stage Renal Disease:  - MWF schedule. Treatment for tomorrow.   2. Acute Respiratory Failure: on BIPAP on admission, now on [place] O2. With RUL pneumonia on examination and CXR. WBC improved. Afebrile.  - continue [place] O2 - Continue supportive care.   3. Hypertension: elevated. nicardipine gtt on admission.  Echocardiogram with systolic and diastolic congestive heart failure EF 30-35% and moderate mitral valve regurg.  History of atrial fibrillation on warfarin.  - work up with 24 hour metanephrines, catecholamine and 5-HIAA - losartan, hydralazine, imdur, furosemide, and metoprolol.  - Appreciate cards input.   4. Anemia of chronic kidney disease: Hemoglobin 9.9 -  EPO with HD treatment  5. Secondary Hyperparathyroidism with hyperphosphatemia. Outpatient phos 6.9. PTH and calcium at goal.  - Continue sevelamer. - Continue cinacalcet   LOS: [place], [place] 9/6/20183:13 PM

## 2016-09-12 NOTE — Care Management Important Message (Signed)
Important Message  Patient Details  Name: Teresa Cook MRN: [id number] Date of Birth: 1955/09/26   Medicare Important Message Given:  Yes    Beverly Sessions, RN 09/12/2016, 2:37 PM

## 2016-09-12 NOTE — Progress Notes (Signed)
ANTICOAGULATION CONSULT NOTE - Initial Consult  Pharmacy Consult for warfarin Indication: atrial fibrillation  Allergies  Allergen Reactions  . Gabapentin Other (See Comments)    Seizure  . Adhesive [Tape] Itching    Silk tape is ok to use.    Patient Measurements: Height: 5\' 4"  (162.6 cm) Weight: 130 lb 8.2 oz (59.2 kg) IBW/kg (Calculated) : 54.7  Vital Signs: Temp: 98.4 F (36.9 C) (09/06 0530) Temp Source: Oral (09/06 0530) BP: 154/76 (09/06 0530) Pulse Rate: 77 (09/06 0530)  Labs:  Recent Labs  09/09/16 1135 09/09/16 1853 09/10/16 0238 09/11/16 0331 09/11/16 0943 09/11/16 0944 09/12/16 0352  HGB  --   --  10.6*  --   --  9.9*  --   HCT  --   --  31.1*  --   --  29.9*  --   PLT  --   --  181  --   --  175  --   LABPROT  --   --  21.3* 19.6*  --   --  18.2*  INR  --   --  1.86 1.67  --   --  1.52  CREATININE  --   --  10.52*  --  13.27*  --   --   TROPONINI 0.05* 0.09* 0.06*  --   --   --   --     Estimated Creatinine Clearance: 3.8 mL/min (A) (by C-G formula based on SCr of 13.27 mg/dL (H)).   Medical History: Past Medical History:  Diagnosis Date  . Anemia   . Anxiety   . Atrial fibrillation ([place])   . Breast cancer ([place]) 01/2016   bilateral  . CHF (congestive heart failure) ([place])   . Chronic kidney disease   . Depression   . Dialysis patient ([place])   . DVT (deep venous thrombosis) (H[facility])    left leg  . DVT (deep venous thrombosis) ([place]) 1985   right thigh  . Dysrhythmia   . Gout   . Headache   . HTN (hypertension)   . Hypertension   . Parathyroid abnormality ([place])   . Parathyroid disease ([place])   . Pneumonia 12/2015  . Psoriasis   . Renal insufficiency   . Sickle cell trait (H[facility])    traits    Assessment: 61 year old patient on warfarin for atrial fibrillation.   Patient's home regimen:  Warfarin is 7.5mg  M,Tu,W,Th                                               Warfarin 5 mg on Fri, Sat, Sun.   Date INR Dose  9/3 1.81 5mg    9/4 1.86 3mg   9/5 1.69 7.5mg  9/6 1.52 7.5mg    Goal of Therapy:  INR 2-3 Monitor platelets by anticoagulation protocol: Yes   Plan:  INR subtherapeutic this AM and trending down. Will give warfarin 7.5 mg x 1 dose this evening.   Will continue to follow up with INRs daily.   Pernell Dupre, PharmD, BCPS Clinical Pharmacist 09/12/2016 8:56 AM

## 2016-09-12 NOTE — Progress Notes (Signed)
Notified dr. Jannifer Franklin of tele calling with a 12 beat run of pvc and 29 beat run of vtach, upon checking on pt she was asleep. Acknowledged and new order placed.

## 2016-09-13 ENCOUNTER — Inpatient Hospital Stay (HOSPITAL_BASED_OUTPATIENT_CLINIC_OR_DEPARTMENT_OTHER): Payer: Medicare Other

## 2016-09-13 DIAGNOSIS — R0602 Shortness of breath: Secondary | ICD-10-CM

## 2016-09-13 DIAGNOSIS — I10 Essential (primary) hypertension: Secondary | ICD-10-CM

## 2016-09-13 DIAGNOSIS — I160 Hypertensive urgency: Secondary | ICD-10-CM | POA: Diagnosis not present

## 2016-09-13 DIAGNOSIS — I428 Other cardiomyopathies: Secondary | ICD-10-CM | POA: Diagnosis not present

## 2016-09-13 LAB — RENAL FUNCTION PANEL
Albumin: 3.3 g/dL — ABNORMAL LOW (ref 3.5–5.0)
Anion gap: 13 (ref 5–15)
BUN: 60 mg/dL — AB (ref 6–20)
CHLORIDE: 104 mmol/L (ref 101–111)
CO2: 23 mmol/L (ref 22–32)
CREATININE: 10.51 mg/dL — AB (ref 0.44–1.00)
Calcium: 9 mg/dL (ref 8.9–10.3)
GFR calc Af Amer: 4 mL/min — ABNORMAL LOW (ref >60)
GFR calc non Af Amer: 3 mL/min — ABNORMAL LOW (ref >60)
Glucose, Bld: 95 mg/dL (ref 65–99)
Phosphorus: 5.6 mg/dL — ABNORMAL HIGH (ref 2.5–4.6)
Potassium: 4.6 mmol/L (ref 3.5–5.1)
Sodium: 140 mmol/L (ref 135–145)

## 2016-09-13 LAB — CBC
HCT: 33.4 % — ABNORMAL LOW (ref 35.0–47.0)
Hemoglobin: 11.2 g/dL — ABNORMAL LOW (ref 12.0–16.0)
MCH: 29.8 pg (ref 26.0–34.0)
MCHC: 33.7 g/dL (ref 32.0–36.0)
MCV: 88.6 fL (ref 80.0–100.0)
Platelets: 163 10*3/uL (ref 150–440)
RBC: 3.76 MIL/uL — AB (ref 3.80–5.20)
RDW: 16.9 % — ABNORMAL HIGH (ref 11.5–14.5)
WBC: 6.5 10*3/uL (ref 3.6–11.0)

## 2016-09-13 LAB — NM MYOCAR MULTI W/SPECT W/WALL MOTION / EF
CHL CUP NUCLEAR SSS: 0
CHL CUP RESTING HR STRESS: 80 {beats}/min
CSEPHR: 84 %
CSEPPHR: 133 {beats}/min
LV dias vol: 117 mL (ref 46–106)
LV sys vol: 56 mL
SDS: 0
SRS: 13
TID: 0.9

## 2016-09-13 LAB — PROTIME-INR
INR: 1.45
PROTHROMBIN TIME: 17.5 s — AB (ref 11.4–15.2)

## 2016-09-13 MED ORDER — WARFARIN SODIUM 10 MG PO TABS
10.0000 mg | ORAL_TABLET | Freq: Once | ORAL | Status: AC
  Administered 2016-09-13: 10 mg via ORAL
  Filled 2016-09-13: qty 1

## 2016-09-13 MED ORDER — ALTEPLASE 2 MG IJ SOLR
4.0000 mg | Freq: Once | INTRAMUSCULAR | Status: AC
  Administered 2016-09-13: 4 mg
  Filled 2016-09-13: qty 4

## 2016-09-13 MED ORDER — TECHNETIUM TC 99M TETROFOSMIN IV KIT
31.6800 | PACK | Freq: Once | INTRAVENOUS | Status: AC | PRN
  Administered 2016-09-13: 31.68 via INTRAVENOUS

## 2016-09-13 MED ORDER — TECHNETIUM TC 99M TETROFOSMIN IV KIT
12.5500 | PACK | Freq: Once | INTRAVENOUS | Status: AC | PRN
  Administered 2016-09-13: 12.55 via INTRAVENOUS

## 2016-09-13 MED ORDER — REGADENOSON 0.4 MG/5ML IV SOLN
0.4000 mg | Freq: Once | INTRAVENOUS | Status: AC
  Administered 2016-09-13: 0.4 mg via INTRAVENOUS
  Filled 2016-09-13: qty 5

## 2016-09-13 MED ORDER — HYDROCODONE-ACETAMINOPHEN 5-325 MG PO TABS
1.0000 | ORAL_TABLET | ORAL | Status: DC | PRN
  Administered 2016-09-13: 1 via ORAL
  Filled 2016-09-13: qty 1

## 2016-09-13 NOTE — Progress Notes (Signed)
Pre hd assessment  

## 2016-09-13 NOTE — Progress Notes (Signed)
  End of hd 

## 2016-09-13 NOTE — Progress Notes (Signed)
Post hd vitals 

## 2016-09-13 NOTE — Progress Notes (Signed)
Patient taken off the floor for stress test. Patient is alert and oriented, No acute distress noted.

## 2016-09-13 NOTE — Progress Notes (Signed)
ANTICOAGULATION CONSULT NOTE - Initial Consult  Pharmacy Consult for warfarin Indication: atrial fibrillation  Allergies  Allergen Reactions  . Gabapentin Other (See Comments)    Seizure  . Adhesive [Tape] Itching    Silk tape is ok to use.    Patient Measurements: Height: 5\' 4"  (162.6 cm) Weight: 130 lb 8.2 oz (59.2 kg) IBW/kg (Calculated) : 54.7  Vital Signs: Temp: 98.2 F (36.8 C) (09/07 0415) Temp Source: Oral (09/07 0415) BP: 154/91 (09/07 0415) Pulse Rate: 82 (09/07 0415)  Labs:  Recent Labs  09/11/16 0331 09/11/16 0943 09/11/16 0944 09/12/16 0352 09/13/16 0438  HGB  --   --  9.9*  --   --   HCT  --   --  29.9*  --   --   PLT  --   --  175  --   --   LABPROT 19.6*  --   --  18.2* 17.5*  INR 1.67  --   --  1.52 1.45  CREATININE  --  13.27*  --   --   --     Estimated Creatinine Clearance: 3.8 mL/min (A) (by C-G formula based on SCr of 13.27 mg/dL (H)).   Medical History: Past Medical History:  Diagnosis Date  . Anemia   . Anxiety   . Breast cancer ([place]) 01/2016   bilateral  . CHF (congestive heart failure) ([place])   . Chronic kidney disease   . Depression   . Dialysis patient ([place])   . DVT (deep venous thrombosis) (H[facility])    left leg  . DVT (deep venous thrombosis) ([place]) 1985   right thigh  . Dysrhythmia   . Gout   . Headache   . HTN (hypertension)   . Hypertension   . Parathyroid abnormality ([place])   . Parathyroid disease ([place])   . Pneumonia 12/2015  . Psoriasis   . Renal insufficiency   . Sickle cell trait (HCC)    traits    Assessment: 61 year old patient on warfarin for atrial fibrillation.   Patient's home regimen:  Warfarin is 7.5mg  M,Tu,W,Th                                               Warfarin 5 mg on Fri, Sat, Sun.   Date INR Dose  9/3 1.81 5mg   9/4 1.86 3mg   9/5 1.69 7.5mg  9/6 1.52 7.5mg   9/7  1.45 10mg   Goal of Therapy:  INR 2-3 Monitor platelets by anticoagulation protocol: Yes   Plan:  INR subtherapeutic this AM  and trending down. Will give warfarin 10 mg x 1 dose this evening.   Will continue to follow up with INRs daily.   Pernell Dupre, PharmD, BCPS Clinical Pharmacist 09/13/2016 7:44 AM

## 2016-09-13 NOTE — Progress Notes (Signed)
[facility]  ROUNDING NOTE   Subjective:   Seen and examined on hemodialysis. RIJ permcath with low blood flows.   Cardiac stress test this morning.   Objective:  Vital signs in last 24 hours:  Temp:  [98.2 F (36.8 C)-99 F (37.2 C)] 99 F (37.2 C) (09/07 1200) Pulse Rate:  [82-98] (P) 98 (09/07 1305) Resp:  [14-23] (P) 23 (09/07 1305) BP: (141-174)/(82-105) (P) 158/90 (09/07 1305) SpO2:  [96 %-99 %] 96 % (09/07 1300) Weight:  [60.2 kg (132 lb 11.5 oz)] 60.2 kg (132 lb 11.5 oz) (09/07 1200)  Weight change:  Filed Weights   09/09/16 0815 09/11/16 0935 09/13/16 1200  Weight: 57.7 kg (127 lb 3.3 oz) 59.2 kg (130 lb 8.2 oz) 60.2 kg (132 lb 11.5 oz)    Intake/Output: I/O last 3 completed shifts: In: 223 [P.O.:120; I.V.:3; IV Piggyback:100] Out: 40 [Urine:40]   Intake/Output this shift:  No intake/output data recorded.  Physical Exam: General: Laying in bed  Head: [place]/AT, Hearing intact   Eyes: Anicteric  Neck: Supple, trachea midline  Lungs:  Right upper quadrant rales  Heart: regular  Abdomen:  Soft, nontender  Extremities: No peripheral edema.  Neurologic: Nonfocal, moving all four extremities  Skin: No lesions  Access: Left IJ permcath    Basic Metabolic Panel:  Recent Labs Lab 09/09/16 0619 09/09/16 1135 09/10/16 0238 09/11/16 0943 09/13/16 1219  NA 141  --  138 139 140  K 6.1*  --  4.7 4.8 4.6  CL 107  --  103 102 104  CO2 20*  --  22 24 23   GLUCOSE 112*  --  89 117* 95  BUN 72*  --  68* 78* 60*  CREATININE 11.13*  --  10.52* 13.27* 10.51*  CALCIUM 9.5  --  8.4* 8.7* 9.0  MG  --   --  2.3  --   --   PHOS  --  7.5*  --  7.3* 5.6*    Liver Function Tests:  Recent Labs Lab 09/09/16 0619 09/11/16 0943 09/13/16 1219  AST 35  --   --   ALT 28  --   --   ALKPHOS 98  --   --   BILITOT 0.8  --   --   PROT 8.0  --   --   ALBUMIN 4.0 3.2* 3.3*   No results for input(s): LIPASE, AMYLASE in the last 168 hours. No results for  input(s): AMMONIA in the last 168 hours.  CBC:  Recent Labs Lab 09/09/16 0620 09/10/16 0238 09/11/16 0944 09/13/16 1219  WBC 12.9* 7.4 6.1 6.5  HGB 13.1 10.6* 9.9* 11.2*  HCT 39.9 31.1* 29.9* 33.4*  MCV 88.4 86.7 87.3 88.6  PLT 288 181 175 163    Cardiac Enzymes:  Recent Labs Lab 09/09/16 0620 09/09/16 1135 09/09/16 1853 09/10/16 0238  TROPONINI 0.04* 0.05* 0.09* 0.06*    BNP: Invalid input(s): POCBNP  CBG:  Recent Labs Lab 09/09/16 0805  GLUCAP 101*    Microbiology: Results for orders placed or performed during the hospital encounter of 09/09/16  Culture, blood (routine x 2)     Status: None (Preliminary result)   Collection Time: 09/09/16  6:58 AM  Result Value Ref Range Status   Specimen Description BLOOD RIGHT ANTECUBITAL  Final   Special Requests   Final    BOTTLES DRAWN AEROBIC AND ANAEROBIC Blood Culture adequate volume   Culture NO GROWTH 4 DAYS  Final   Report Status PENDING  Incomplete  Culture, blood (routine x 2)     Status: None (Preliminary result)   Collection Time: 09/09/16  7:03 AM  Result Value Ref Range Status   Specimen Description BLOOD BLOOD RIGHT WRIST  Final   Special Requests   Final    BOTTLES DRAWN AEROBIC AND ANAEROBIC Blood Culture adequate volume   Culture NO GROWTH 4 DAYS  Final   Report Status PENDING  Incomplete  Urine culture     Status: Abnormal   Collection Time: 09/10/16  3:21 PM  Result Value Ref Range Status   Specimen Description URINE, RANDOM  Final   Special Requests NONE  Final   Culture MULTIPLE SPECIES PRESENT, SUGGEST RECOLLECTION (A)  Final   Report Status 09/12/2016 FINAL  Final    Coagulation Studies:  Recent Labs  09/11/16 0331 09/12/16 0352 09/13/16 0438  LABPROT 19.6* 18.2* 17.5*  INR 1.67 1.52 1.45    Urinalysis: No results for input(s): COLORURINE, LABSPEC, PHURINE, GLUCOSEU, HGBUR, BILIRUBINUR, KETONESUR, PROTEINUR, UROBILINOGEN, NITRITE, LEUKOCYTESUR in the last 72 hours.  Invalid  input(s): APPERANCEUR    Imaging: Nm Myocar Multi W/spect W/wall Motion / Ef  Result Date: 09/13/2016 Pharmacological myocardial perfusion imaging study with no significant  Ischemia GI uptake artifact noted Small region of fixed apical thinning on attenuation corrected images otherwise normal perfusion Normal wall motion, EF estimated at 44% No EKG changes concerning for ischemia at peak stress or in recovery. Resting EKG with normal sinus rhythm, LVH, diffuse ST and T wave abnormality anterolateral and inferior leads Hypertension at rest 176/105 Low risk scan Signed, Esmond Plants, MD, Ph.D [facility]     Medications:   . sodium chloride     . alteplase  4 mg Intracatheter Once  . amoxicillin-clavulanate  1 tablet Oral QHS  . calcium carbonate  2 tablet Oral TID  . cinacalcet  60 mg Oral TID WC  . epoetin (EPOGEN/PROCRIT) injection  10,000 Units Intravenous Q M,W,F-HD  . feeding supplement (ENSURE ENLIVE)  237 mL Oral Daily  . fluticasone  1 spray Each Nare Daily  . furosemide  40 mg Oral BID  . heparin  5,000 Units Subcutaneous Q8H  . hydrALAZINE  50 mg Oral Q8H  . isosorbide mononitrate  30 mg Oral Daily  . losartan  100 mg Oral Daily  . metoprolol succinate  100 mg Oral Daily  . pantoprazole  40 mg Oral Daily  . sertraline  150 mg Oral Daily  . sevelamer carbonate  2,400 mg Oral TID WC  . sodium chloride flush  3 mL Intravenous Q12H  . warfarin  10 mg Oral ONCE-1800  . Warfarin - Pharmacist Dosing Inpatient   Does not apply q1800  . zolpidem  5 mg Oral QHS   sodium chloride, acetaminophen **OR** acetaminophen, hydrALAZINE, ondansetron **OR** ondansetron (ZOFRAN) IV, senna-docusate, sodium chloride, sodium chloride flush, traMADol  Assessment/ Plan:  [name] is a 61 y.o. black female with ESRD on hemodialysis, breast cancer status post mastectomy, congestive heart failure, depression, DVT, seizure disorder, anemia, sickle cell trait, hypertension, gout, atrial  fibrillation  [place]. MWF  1. End Stage Renal Disease: with low blood flow through CVC.  - Pack catheter with TPA - Monitor daily for dialysis need.  - MWF schedule.   2. Acute Respiratory Failure: on BIPAP on admission, now on [place] O2. With RUL pneumonia on examination and CXR. WBC improved. Afebrile.  - continue [place] O2 - Continue supportive care.   3. Hypertension:  elevated. nicardipine gtt on admission.  Echocardiogram with systolic and diastolic congestive heart failure EF 30-35% and moderate mitral valve regurg.  History of atrial fibrillation on warfarin.  - work up with 24 hour metanephrines, catecholamine and 5-HIAA - losartan, hydralazine, imdur, furosemide, and metoprolol.  - Appreciate cards input.   4. Anemia of chronic kidney disease: Hemoglobin 11.2 - held EPO  5. Secondary Hyperparathyroidism with hyperphosphatemia. Outpatient phos 6.9. PTH and calcium at goal.  - Continue sevelamer. - Continue cinacalcet   LOS: [place], Chalyn Amescua 9/7/20181:27 PM

## 2016-09-13 NOTE — Progress Notes (Signed)
Progress Note  Patient Name: Teresa Cook Date of Encounter: 09/13/2016  Primary Cardiologist: New to [place] well as morning, Mild fullness in her face Scheduled for stress test this morning, then dialysis Denies any chest pain  Inpatient Medications    Scheduled Meds: . amoxicillin-clavulanate  1 tablet Oral QHS  . calcium carbonate  2 tablet Oral TID  . cinacalcet  60 mg Oral TID WC  . epoetin (EPOGEN/PROCRIT) injection  10,000 Units Intravenous Q M,W,F-HD  . feeding supplement (ENSURE ENLIVE)  237 mL Oral Daily  . fluticasone  1 spray Each Nare Daily  . furosemide  40 mg Oral BID  . heparin  5,000 Units Subcutaneous Q8H  . hydrALAZINE  50 mg Oral Q8H  . isosorbide mononitrate  30 mg Oral Daily  . losartan  100 mg Oral Daily  . metoprolol succinate  100 mg Oral Daily  . pantoprazole  40 mg Oral Daily  . sertraline  150 mg Oral Daily  . sevelamer carbonate  2,400 mg Oral TID WC  . sodium chloride flush  3 mL Intravenous Q12H  . warfarin  10 mg Oral ONCE-1800  . Warfarin - Pharmacist Dosing Inpatient   Does not apply q1800  . zolpidem  5 mg Oral QHS   Continuous Infusions: . sodium chloride     PRN Meds: sodium chloride, acetaminophen **OR** acetaminophen, hydrALAZINE, ondansetron **OR** ondansetron (ZOFRAN) IV, senna-docusate, sodium chloride, sodium chloride flush, traMADol   Vital Signs    Vitals:   09/12/16 2053 09/12/16 2054 09/13/16 0415 09/13/16 0747  BP: (!) 141/82 (!) 141/82 (!) 154/91 (!) 153/88  Pulse:  96 82 82  Resp:  20 20 16   Temp:  98.5 F (36.9 C) 98.2 F (36.8 C) 98.3 F (36.8 C)  TempSrc:  Oral Oral Oral  SpO2:  98% 98% 97%  Weight:      Height:        Intake/Output Summary (Last 24 hours) at 09/13/16 0958 Last data filed at 09/13/16 0500  Gross per 24 hour  Intake                0 ml  Output                0 ml  Net                0 ml   Filed Weights   09/09/16 0617 09/09/16 0815 09/11/16 0935  Weight:  127 lb 8 oz (57.8 kg) 127 lb 3.3 oz (57.7 kg) 130 lb 8.2 oz (59.2 kg)    Telemetry    nsr- Personally Reviewed  ECG     Physical Exam   GEN: No acute distress.   Neck: No JVD Cardiac: RRR, no murmurs, rubs, or gallops.  Respiratory: Clear to auscultation bilaterally. GI: Soft, nontender, non-distended  MS: No edema; No deformity. Neuro:  Nonfocal  Psych: Normal affect   Labs    Chemistry Recent Labs Lab 09/09/16 0619 09/10/16 0238 09/11/16 0943  NA 141 138 139  K 6.1* 4.7 4.8  CL 107 103 102  CO2 20* 22 24  GLUCOSE 112* 89 117*  BUN 72* 68* 78*  CREATININE 11.13* 10.52* 13.27*  CALCIUM 9.5 8.4* 8.7*  PROT 8.0  --   --   ALBUMIN 4.0  --  3.2*  AST 35  --   --   ALT 28  --   --   ALKPHOS 98  --   --  BILITOT 0.8  --   --   GFRNONAA 3* 3* 3*  GFRAA 4* 4* 3*  ANIONGAP 14 13 13      Hematology Recent Labs Lab 09/09/16 0620 09/10/16 0238 09/11/16 0944  WBC 12.9* 7.4 6.1  RBC 4.52 3.59* 3.42*  HGB 13.1 10.6* 9.9*  HCT 39.9 31.1* 29.9*  MCV 88.4 86.7 87.3  MCH 29.0 29.6 29.1  MCHC 32.8 34.2 33.3  RDW 17.2* 16.8* 17.0*  PLT 288 181 175    Cardiac Enzymes Recent Labs Lab 09/09/16 0620 09/09/16 1135 09/09/16 1853 09/10/16 0238  TROPONINI 0.04* 0.05* 0.09* 0.06*   No results for input(s): TROPIPOC in the last 168 hours.   BNPNo results for input(s): BNP, PROBNP in the last 168 hours.   DDimer No results for input(s): DDIMER in the last 168 hours.   Radiology    No results found.  Cardiac Studies   chocardiogram ejection fraction 30-35%  Patient Profile     61 y.o. female  with history of end-stage renal disease on dialysis Monday was a Friday, on anticoagulation for history of DVT, breast cancer, bilateral mastectomy, chemotherapy presenting to the hospital with shortness of breath, chest pressure, indigestion   Assessment & Plan    A/P --- Respiratory distress Secondary to pulmonary edema, in the setting of end-stage renal disease  on hemodialysis Elevated right heart pressures on echocardiogram Dialysis today,  may need repeat dialysis on Saturday and still has fullness in her face (likely from fluid retention). May need to increase Lasix on nondialysis days  --- Cardiomyopathy Etiology unclear, moderately depressed ejection fraction, 30-35% no previous ischemia workup  pharmacologic Myoview scheduled for this morning She wanted to avoid cardiac catheterization, use of dye, invasive procedure  --- Nonsustained VT Ischemia workup as planned above No further arrhythmia on telemetry, reviewed personally by myself  --- Elevated troponin Minimally elevated, likely secondary to demand ischemia in the setting of end-stage renal disease Stress test today  End-stage renal disease on dialysis She reports dry weight 56 kg Given her presentation/acute respiratory distress requiring BiPAP,  she may need lower dry weight. By her report she does not drink significant fluids on nondialysis days  Greater than 50% was spent in counseling and coordination of care with patient Total encounter time 25 minutes or more   Signed, Ida Rogue, MD  09/13/2016, 9:58 AM

## 2016-09-13 NOTE — Progress Notes (Signed)
Post hd assessment 

## 2016-09-13 NOTE — Progress Notes (Signed)
Hd start, pt post stress test, alert, c/o of nausea and fatigue, stable

## 2016-09-13 NOTE — Progress Notes (Signed)
Patient ID: Teresa Cook, female   DOB: 28-Aug-1955, 61 y.o.   MRN: [id number]  Patient ID: Teresa Cook, female   DOB: 02/10/1955, 61 y.o.   MRN: [id number]   [facility] PROGRESS NOTE  Steffani Dionisio EHU:[id number] DOB: November 18, 1955 DOA: 09/09/2016 PCP: Murlean Iba, MD  HPI/Subjective: Patient seen this morning prior to stress test and she was feeling better. No chest pain. Only slight shortness of breath.  Objective: Vitals:   09/13/16 1404 09/13/16 1546  BP: (!) 155/84 (!) 142/93  Pulse: 93 (!) 108  Resp: 20   Temp: 98.5 F (36.9 C) 98.2 F (36.8 C)  SpO2: 98% 98%    Filed Weights   09/09/16 0815 09/11/16 0935 09/13/16 1200  Weight: 57.7 kg (127 lb 3.3 oz) 59.2 kg (130 lb 8.2 oz) 60.2 kg (132 lb 11.5 oz)    ROS: Review of Systems  Constitutional: Negative for chills and fever.  Eyes: Negative for blurred vision.  Respiratory: Positive for shortness of breath. Negative for cough.   Cardiovascular: Negative for chest pain.  Gastrointestinal: Positive for nausea. Negative for abdominal pain, constipation, diarrhea and vomiting.  Genitourinary: Negative for dysuria.  Musculoskeletal: Negative for joint pain.  Neurological: Negative for dizziness and headaches.   Exam: Physical Exam  Constitutional: She is oriented to person, place, and time.  HENT:  Nose: No mucosal edema.  Mouth/Throat: No oropharyngeal exudate or posterior oropharyngeal edema.  Eyes: Pupils are equal, round, and reactive to light. Conjunctivae, EOM and lids are normal.  Neck: No JVD present. Carotid bruit is not present. No edema present. No thyroid mass and no thyromegaly present.  Cardiovascular: S1 normal and S2 normal.  Exam reveals no gallop.   No murmur heard. Pulses:      Dorsalis pedis pulses are 2+ on the right side, and 2+ on the left side.  Respiratory: No respiratory distress. She has decreased breath sounds in the left lower field. She has no wheezes. She has no rhonchi. She  has no rales.  GI: Soft. Bowel sounds are normal. There is no tenderness.  Musculoskeletal:       Right ankle: She exhibits no swelling.       Left ankle: She exhibits no swelling.  Lymphadenopathy:    She has no cervical adenopathy.  Neurological: She is alert and oriented to person, place, and time. No cranial nerve deficit.  Skin: Skin is warm. No rash noted. Nails show no clubbing.  Psychiatric: She has a normal mood and affect.      Data Reviewed: Basic Metabolic Panel:  Recent Labs Lab 09/09/16 0619 09/09/16 1135 09/10/16 0238 09/11/16 0943 09/13/16 1219  NA 141  --  138 139 140  K 6.1*  --  4.7 4.8 4.6  CL 107  --  103 102 104  CO2 20*  --  22 24 23   GLUCOSE 112*  --  89 117* 95  BUN 72*  --  68* 78* 60*  CREATININE 11.13*  --  10.52* 13.27* 10.51*  CALCIUM 9.5  --  8.4* 8.7* 9.0  MG  --   --  2.3  --   --   PHOS  --  7.5*  --  7.3* 5.6*   Liver Function Tests:  Recent Labs Lab 09/09/16 0619 09/11/16 0943 09/13/16 1219  AST 35  --   --   ALT 28  --   --   ALKPHOS 98  --   --   BILITOT 0.8  --   --  PROT 8.0  --   --   ALBUMIN 4.0 3.2* 3.3*   CBC:  Recent Labs Lab 09/09/16 0620 09/10/16 0238 09/11/16 0944 09/13/16 1219  WBC 12.9* 7.4 6.1 6.5  HGB 13.1 10.6* 9.9* 11.2*  HCT 39.9 31.1* 29.9* 33.4*  MCV 88.4 86.7 87.3 88.6  PLT 288 181 175 163   Cardiac Enzymes:  Recent Labs Lab 09/09/16 0620 09/09/16 1135 09/09/16 1853 09/10/16 0238  TROPONINI 0.04* 0.05* 0.09* 0.06*   BNP (last 3 results)  Recent Labs  01/15/16 2106 08/21/16 0818  BNP 281.0* 2,034.0*     CBG:  Recent Labs Lab 09/09/16 0805  GLUCAP 101*      Scheduled Meds: . amoxicillin-clavulanate  1 tablet Oral QHS  . calcium carbonate  2 tablet Oral TID  . cinacalcet  60 mg Oral TID WC  . epoetin (EPOGEN/PROCRIT) injection  10,000 Units Intravenous Q M,W,F-HD  . feeding supplement (ENSURE ENLIVE)  237 mL Oral Daily  . fluticasone  1 spray Each Nare Daily  .  furosemide  40 mg Oral BID  . heparin  5,000 Units Subcutaneous Q8H  . hydrALAZINE  50 mg Oral Q8H  . isosorbide mononitrate  30 mg Oral Daily  . losartan  100 mg Oral Daily  . metoprolol succinate  100 mg Oral Daily  . pantoprazole  40 mg Oral Daily  . sertraline  150 mg Oral Daily  . sevelamer carbonate  2,400 mg Oral TID WC  . sodium chloride flush  3 mL Intravenous Q12H  . warfarin  10 mg Oral ONCE-1800  . Warfarin - Pharmacist Dosing Inpatient   Does not apply q1800  . zolpidem  5 mg Oral QHS   Continuous Infusions: . sodium chloride      Assessment/Plan:  1. Dialysis Catheter malfunction. Catheter Clotted off and needed altepace. Only had 1 hour of dialysis. They will try dialysis again tomorrow. 2. Ventricular tachycardia. Stress test reported as negative. 3. Acute hypoxic respiratory failure.  Patient required BiPAP on presentation to the ICU. Now breathing comfortably on room air. 4. Accelerated hypertension. Blood pressure trending better. Continue Norvasc 5 mg an increased dose of hydralazine. Continue metoprolol,  losartan. Check 24-hour urine for metanephrines, 5-HIAA and catecholamines. 5. 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 Dialysis to remove fluid.  On metoprolol, losartan, Lasix 6. Possible pneumonia on vancomycin and Zosyn initially switched over to Augmentin daily at bedtime. 7. End-stage renal disease on dialysis as per nephrology 8. History of DVT and Atrial fibrillation on Coumadin. INR 1.45 today.  Pharmacy given 10 mg of Coumadin tonight 9. Depression on Zoloft   Code Status:     Code Status Orders        Start     Ordered   09/09/16 0806  Full code  Continuous     09/09/16 0805    Code Status History    Date Active Date Inactive Code Status Order ID Comments User Context   08/21/2016 11:02 AM 08/23/2016  8:29 PM Full Code [id number]  [name], MD Inpatient   02/29/2016  5:22 PM 03/01/2016  8:55 PM  DNR [id number]  Bettey Costa, MD Inpatient   02/29/2016  5:04 PM 02/29/2016  5:22 PM Full Code [id number]  Bettey Costa, MD Inpatient   01/23/2016  6:33 PM 01/29/2016  9:45 PM Full Code [id number]  Hubbard Robinson, MD Inpatient   01/22/2016  3:43 PM 01/23/2016  6:33 PM Full Code [id number]  Dustin Flock,  MD Inpatient   12/31/2015  9:39 PM 01/03/2016  9:31 PM Full Code [id number]  [name], MD Inpatient   12/15/2015 10:40 PM 12/28/2015  6:52 PM Full Code [id number]  Hugelmeyer, Ubaldo Glassing, DO Inpatient   08/31/2015  4:26 AM 09/02/2015  2:35 PM Full Code [id number]  Harrie Foreman, MD Inpatient   08/27/2015  4:06 AM 08/29/2015  3:57 PM Full Code [id number]  Saundra Shelling, MD ED   07/09/2015  2:44 PM 07/12/2015  7:55 PM Full Code [id number]  Laverle Hobby, MD ED   09/19/2014 11:47 AM 09/20/2014  6:25 PM Full Code [id number]  Dustin Flock, MD ED   08/29/2014  2:28 PM 08/30/2014  5:43 PM Full Code [id number]  Fritzi Mandes, MD Inpatient    Advance Directive Documentation     Most Recent Value  Type of Advance Directive  Healthcare Power of Attorney  Pre-existing out of facility DNR order (yellow form or pink MOST form)  -  "MOST" Form in Place?  -     Disposition Plan: patient will require dialysis and to make sure that catheter is working prior to disposition. Dialysis will be tried again tomorrow  Consultants:  Critical care specialist  nephrology  Antibiotics:  Augmentin  Time spent: 35 minutes. Spoke with daughter about potential disposition tonight but then left a message that it will be tomorrow.  Loletha Grayer  [employer]

## 2016-09-13 NOTE — Progress Notes (Signed)
Pre hd info 

## 2016-09-14 DIAGNOSIS — J9601 Acute respiratory failure with hypoxia: Principal | ICD-10-CM

## 2016-09-14 DIAGNOSIS — I5043 Acute on chronic combined systolic (congestive) and diastolic (congestive) heart failure: Secondary | ICD-10-CM

## 2016-09-14 LAB — CULTURE, BLOOD (ROUTINE X 2)
CULTURE: NO GROWTH
Culture: NO GROWTH
SPECIAL REQUESTS: ADEQUATE
Special Requests: ADEQUATE

## 2016-09-14 LAB — PROTIME-INR
INR: 1.57
Prothrombin Time: 18.6 seconds — ABNORMAL HIGH (ref 11.4–15.2)

## 2016-09-14 LAB — METANEPHRINES, PLASMA
Metanephrine, Free: 41 pg/mL (ref 0–62)
Normetanephrine, Free: 166 pg/mL — ABNORMAL HIGH (ref 0–145)

## 2016-09-14 MED ORDER — WARFARIN SODIUM 10 MG PO TABS: 10.0000 mg | ORAL_TABLET | Freq: Once | ORAL | 0 refills | Status: DC

## 2016-09-14 MED ORDER — WARFARIN SODIUM 10 MG PO TABS
10.0000 mg | ORAL_TABLET | Freq: Once | ORAL | Status: DC
  Filled 2016-09-14: qty 1

## 2016-09-14 MED ORDER — AMOXICILLIN-POT CLAVULANATE 500-125 MG PO TABS: 1.0000 | ORAL_TABLET | Freq: Every day | ORAL | 0 refills | Status: AC

## 2016-09-14 MED ORDER — ISOSORBIDE MONONITRATE ER 30 MG PO TB24: 30.0000 mg | ORAL_TABLET | Freq: Every day | ORAL | 0 refills | Status: DC

## 2016-09-14 MED ORDER — AMOXICILLIN-POT CLAVULANATE 500-125 MG PO TABS: 1.0000 | ORAL_TABLET | Freq: Every day | ORAL | 0 refills | Status: DC

## 2016-09-14 MED ORDER — LOSARTAN POTASSIUM 50 MG PO TABS: 150.0000 mg | ORAL_TABLET | Freq: Every day | ORAL | 0 refills | Status: DC

## 2016-09-14 MED ORDER — METOPROLOL SUCCINATE ER 100 MG PO TB24: 100.0000 mg | ORAL_TABLET | Freq: Every day | ORAL | 0 refills | Status: DC

## 2016-09-14 MED ORDER — HYDROCODONE-ACETAMINOPHEN 5-325 MG PO TABS: 1.0000 | ORAL_TABLET | ORAL | 0 refills | Status: DC | PRN

## 2016-09-14 MED ORDER — LOSARTAN POTASSIUM 50 MG PO TABS
150.0000 mg | ORAL_TABLET | Freq: Every day | ORAL | Status: DC
  Filled 2016-09-14: qty 3

## 2016-09-14 MED ORDER — HYDRALAZINE HCL 50 MG PO TABS: 50.0000 mg | ORAL_TABLET | Freq: Three times a day (TID) | ORAL | 0 refills | Status: DC

## 2016-09-14 NOTE — Progress Notes (Signed)
[facility]  ROUNDING NOTE   Subjective:   Seen and examined on hemodialysis. Catheter still with low BFR.     HEMODIALYSIS FLOWSHEET:  Blood Flow Rate (mL/min): 250 mL/min Arterial Pressure (mmHg): -150 mmHg Venous Pressure (mmHg): 90 mmHg Transmembrane Pressure (mmHg): 90 mmHg Ultrafiltration Rate (mL/min): 1030 mL/min Dialysate Flow Rate (mL/min): 600 ml/min Conductivity: Machine : 15.4 Conductivity: Machine : 15.4 Dialysis Fluid Bolus: Normal Saline Bolus Amount (mL): 250 mL (prime) Dialysate Change:  (3k)    Objective:  Vital signs in last 24 hours:  Temp:  [98.2 F (36.8 C)-99.3 F (37.4 C)] 98.6 F (37 C) (09/08 1145) Pulse Rate:  [73-108] 81 (09/08 1320) Resp:  [16-22] 16 (09/08 1320) BP: (131-160)/(79-97) 131/94 (09/08 1320) SpO2:  [95 %-100 %] 98 % (09/08 1320)  Weight change:  Filed Weights   09/09/16 0815 09/11/16 0935 09/13/16 1200  Weight: 57.7 kg (127 lb 3.3 oz) 59.2 kg (130 lb 8.2 oz) 60.2 kg (132 lb 11.5 oz)    Intake/Output: I/O last 3 completed shifts: In: 45 [P.O.:60] Out: 290 [Urine:150; Other:140]   Intake/Output this shift:  Total I/O In: 240 [P.O.:240] Out: 340 [Urine:340]  Physical Exam: General: Laying in bed  Head: [place]/AT, Hearing intact   Eyes: Anicteric  Neck: Supple, trachea midline  Lungs:  Right upper quadrant rales  Heart: regular  Abdomen:  Soft, nontender  Extremities: No peripheral edema.  Neurologic: Nonfocal, moving all four extremities  Skin: No lesions  Access: Left IJ permcath    Basic Metabolic Panel:  Recent Labs Lab 09/09/16 0619 09/09/16 1135 09/10/16 0238 09/11/16 0943 09/13/16 1219  NA 141  --  138 139 140  K 6.1*  --  4.7 4.8 4.6  CL 107  --  103 102 104  CO2 20*  --  22 24 23   GLUCOSE 112*  --  89 117* 95  BUN 72*  --  68* 78* 60*  CREATININE 11.13*  --  10.52* 13.27* 10.51*  CALCIUM 9.5  --  8.4* 8.7* 9.0  MG  --   --  2.3  --   --   PHOS  --  7.5*  --  7.3* 5.6*     Liver Function Tests:  Recent Labs Lab 09/09/16 0619 09/11/16 0943 09/13/16 1219  AST 35  --   --   ALT 28  --   --   ALKPHOS 98  --   --   BILITOT 0.8  --   --   PROT 8.0  --   --   ALBUMIN 4.0 3.2* 3.3*   No results for input(s): LIPASE, AMYLASE in the last 168 hours. No results for input(s): AMMONIA in the last 168 hours.  CBC:  Recent Labs Lab 09/09/16 0620 09/10/16 0238 09/11/16 0944 09/13/16 1219  WBC 12.9* 7.4 6.1 6.5  HGB 13.1 10.6* 9.9* 11.2*  HCT 39.9 31.1* 29.9* 33.4*  MCV 88.4 86.7 87.3 88.6  PLT 288 181 175 163    Cardiac Enzymes:  Recent Labs Lab 09/09/16 0620 09/09/16 1135 09/09/16 1853 09/10/16 0238  TROPONINI 0.04* 0.05* 0.09* 0.06*    BNP: Invalid input(s): POCBNP  CBG:  Recent Labs Lab 09/09/16 0805  GLUCAP 101*    Microbiology: Results for orders placed or performed during the hospital encounter of 09/09/16  Culture, blood (routine x 2)     Status: None   Collection Time: 09/09/16  6:58 AM  Result Value Ref Range Status   Specimen Description BLOOD RIGHT ANTECUBITAL  Final   Special Requests   Final    BOTTLES DRAWN AEROBIC AND ANAEROBIC Blood Culture adequate volume   Culture NO GROWTH 5 DAYS  Final   Report Status 09/14/2016 FINAL  Final  Culture, blood (routine x 2)     Status: None   Collection Time: 09/09/16  7:03 AM  Result Value Ref Range Status   Specimen Description BLOOD BLOOD RIGHT WRIST  Final   Special Requests   Final    BOTTLES DRAWN AEROBIC AND ANAEROBIC Blood Culture adequate volume   Culture NO GROWTH 5 DAYS  Final   Report Status 09/14/2016 FINAL  Final  Urine culture     Status: Abnormal   Collection Time: 09/10/16  3:21 PM  Result Value Ref Range Status   Specimen Description URINE, RANDOM  Final   Special Requests NONE  Final   Culture MULTIPLE SPECIES PRESENT, SUGGEST RECOLLECTION (A)  Final   Report Status 09/12/2016 FINAL  Final    Coagulation Studies:  Recent Labs  09/12/16 0352  09/13/16 0438 09/14/16 0411  LABPROT 18.2* 17.5* 18.6*  INR 1.52 1.45 1.57    Urinalysis: No results for input(s): COLORURINE, LABSPEC, PHURINE, GLUCOSEU, HGBUR, BILIRUBINUR, KETONESUR, PROTEINUR, UROBILINOGEN, NITRITE, LEUKOCYTESUR in the last 72 hours.  Invalid input(s): APPERANCEUR    Imaging: Nm Myocar Multi W/spect W/wall Motion / Ef  Result Date: 09/13/2016 Pharmacological myocardial perfusion imaging study with no significant  Ischemia GI uptake artifact noted Small region of fixed apical thinning on attenuation corrected images otherwise normal perfusion Normal wall motion, EF estimated at 44% No EKG changes concerning for ischemia at peak stress or in recovery. Resting EKG with normal sinus rhythm, LVH, diffuse ST and T wave abnormality anterolateral and inferior leads Hypertension at rest 176/105 Low risk scan Signed, Teresa Plants, MD, Ph.D [facility]     Medications:   . sodium chloride     . amoxicillin-clavulanate  1 tablet Oral QHS  . calcium carbonate  2 tablet Oral TID  . cinacalcet  60 mg Oral TID WC  . epoetin (EPOGEN/PROCRIT) injection  10,000 Units Intravenous Q M,W,F-HD  . feeding supplement (ENSURE ENLIVE)  237 mL Oral Daily  . fluticasone  1 spray Each Nare Daily  . furosemide  40 mg Oral BID  . heparin  5,000 Units Subcutaneous Q8H  . hydrALAZINE  50 mg Oral Q8H  . isosorbide mononitrate  30 mg Oral Daily  . losartan  150 mg Oral Daily  . metoprolol succinate  100 mg Oral Daily  . pantoprazole  40 mg Oral Daily  . sertraline  150 mg Oral Daily  . sevelamer carbonate  2,400 mg Oral TID WC  . sodium chloride flush  3 mL Intravenous Q12H  . warfarin  10 mg Oral ONCE-1800  . Warfarin - Pharmacist Dosing Inpatient   Does not apply q1800  . zolpidem  5 mg Oral QHS   sodium chloride, acetaminophen **OR** acetaminophen, hydrALAZINE, HYDROcodone-acetaminophen, ondansetron **OR** ondansetron (ZOFRAN) IV, senna-docusate, sodium chloride, sodium chloride  flush, traMADol  Assessment/ Plan:  [name] is a 61 y.o. black female with ESRD on hemodialysis, breast cancer status post mastectomy, congestive heart failure, depression, DVT, seizure disorder, anemia, sickle cell trait, hypertension, gout, atrial fibrillation  [place]. MWF  1. End Stage Renal Disease: with low blood flow through CVC.  - Packed catheter with TPA - Monitor daily for dialysis need.  - MWF schedule.   2. Acute Respiratory Failure: on BIPAP on  admission, now on [place] O2. With RUL pneumonia on examination and CXR. WBC improved. Afebrile.  - continue  O2 - Continue supportive care.   3. Hypertension: elevated. nicardipine gtt on admission.  Echocardiogram with systolic and diastolic congestive heart failure EF 30-35% and moderate mitral valve regurg.  History of atrial fibrillation on warfarin.  - losartan, hydralazine, imdur, furosemide, and metoprolol.  - Appreciate cards input.   4. Anemia of chronic kidney disease:  - EPO given with HD treatment.   5. Secondary Hyperparathyroidism with hyperphosphatemia. Outpatient phos 6.9. PTH and calcium at goal.  - Continue sevelamer. - Continue cinacalcet   LOS: [place], Lavonia Eager 9/8/20181:28 PM

## 2016-09-14 NOTE — Progress Notes (Signed)
Order received from Dr Posey Pronto to discharge the patient when she returned from dialysis.

## 2016-09-14 NOTE — Progress Notes (Signed)
Discharge instructions reviewed with the patient.  Patient sent out via wheelchair to her friends waiting car

## 2016-09-14 NOTE — Progress Notes (Signed)
Post hd assessment 

## 2016-09-14 NOTE — Progress Notes (Signed)
Pre hd info 

## 2016-09-14 NOTE — Progress Notes (Signed)
Progress Note  Patient Name: Teresa Cook Date of Encounter: 09/14/2016  Primary Cardiologist: New - initial consult by Dr. Rockey Situ  Subjective   [name] feels well this morning. No chest pain or shortness of breath, though notes that she could only lie flat for a few minutes. Face still feels a little full. HD was cut short yesterday due to clotting of HD catheter. She is scheduled for HD again today.  Inpatient Medications    Scheduled Meds: . amoxicillin-clavulanate  1 tablet Oral QHS  . calcium carbonate  2 tablet Oral TID  . cinacalcet  60 mg Oral TID WC  . epoetin (EPOGEN/PROCRIT) injection  10,000 Units Intravenous Q M,W,F-HD  . feeding supplement (ENSURE ENLIVE)  237 mL Oral Daily  . fluticasone  1 spray Each Nare Daily  . furosemide  40 mg Oral BID  . heparin  5,000 Units Subcutaneous Q8H  . hydrALAZINE  50 mg Oral Q8H  . isosorbide mononitrate  30 mg Oral Daily  . losartan  100 mg Oral Daily  . metoprolol succinate  100 mg Oral Daily  . pantoprazole  40 mg Oral Daily  . sertraline  150 mg Oral Daily  . sevelamer carbonate  2,400 mg Oral TID WC  . sodium chloride flush  3 mL Intravenous Q12H  . Warfarin - Pharmacist Dosing Inpatient   Does not apply q1800  . zolpidem  5 mg Oral QHS   Continuous Infusions: . sodium chloride     PRN Meds: sodium chloride, acetaminophen **OR** acetaminophen, hydrALAZINE, HYDROcodone-acetaminophen, ondansetron **OR** ondansetron (ZOFRAN) IV, senna-docusate, sodium chloride, sodium chloride flush, traMADol   Vital Signs    Vitals:   09/13/16 1404 09/13/16 1546 09/13/16 2036 09/14/16 0519  BP: (!) 155/84 (!) 142/93 (!) 147/83 (!) 160/90  Pulse: 93 (!) 108 80 73  Resp: 20  20 18   Temp: 98.5 F (36.9 C) 98.2 F (36.8 C) 99.3 F (37.4 C) 98.3 F (36.8 C)  TempSrc: Oral Oral Oral Oral  SpO2: 98% 98% 97% 95%  Weight:      Height:        Intake/Output Summary (Last 24 hours) at 09/14/16 0810 Last data filed at 09/14/16  0519  Gross per 24 hour  Intake               60 ml  Output              290 ml  Net             -230 ml   Filed Weights   09/09/16 0815 09/11/16 0935 09/13/16 1200  Weight: 127 lb 3.3 oz (57.7 kg) 130 lb 8.2 oz (59.2 kg) 132 lb 11.5 oz (60.2 kg)    Telemetry    Normal sinus rhythm; no arrhythmias - Personally Reviewed  ECG    No new tracings  Physical Exam   GEN: No acute distress.   Neck: No JVD or HJR. Cardiac: RRR, 1/6 systolic murmur. No rubs.  Respiratory: Clear to auscultation bilaterally. GI: Soft, nontender, non-distended  MS: Trace pretibial edema (L>R); No deformity. Neuro:  Nonfocal  Psych: Normal affect   Labs    Chemistry Recent Labs Lab 09/09/16 0619 09/10/16 0238 09/11/16 0943 09/13/16 1219  NA 141 138 139 140  K 6.1* 4.7 4.8 4.6  CL 107 103 102 104  CO2 20* 22 24 23   GLUCOSE 112* 89 117* 95  BUN 72* 68* 78* 60*  CREATININE 11.13* 10.52* 13.27* 10.51*  CALCIUM  9.5 8.4* 8.7* 9.0  PROT 8.0  --   --   --   ALBUMIN 4.0  --  3.2* 3.3*  AST 35  --   --   --   ALT 28  --   --   --   ALKPHOS 98  --   --   --   BILITOT 0.8  --   --   --   GFRNONAA 3* 3* 3* 3*  GFRAA 4* 4* 3* 4*  ANIONGAP 14 13 13 13      Hematology Recent Labs Lab 09/10/16 0238 09/11/16 0944 09/13/16 1219  WBC 7.4 6.1 6.5  RBC 3.59* 3.42* 3.76*  HGB 10.6* 9.9* 11.2*  HCT 31.1* 29.9* 33.4*  MCV 86.7 87.3 88.6  MCH 29.6 29.1 29.8  MCHC 34.2 33.3 33.7  RDW 16.8* 17.0* 16.9*  PLT 181 175 163    Cardiac Enzymes Recent Labs Lab 09/09/16 0620 09/09/16 1135 09/09/16 1853 09/10/16 0238  TROPONINI 0.04* 0.05* 0.09* 0.06*   No results for input(s): TROPIPOC in the last 168 hours.   BNPNo results for input(s): BNP, PROBNP in the last 168 hours.   DDimer No results for input(s): DDIMER in the last 168 hours.   Radiology    Nm Myocar Multi W/spect W/wall Motion / Ef  Result Date: 09/13/2016 Pharmacological myocardial perfusion imaging study with no significant   Ischemia GI uptake artifact noted Small region of fixed apical thinning on attenuation corrected images otherwise normal perfusion Normal wall motion, EF estimated at 44% No EKG changes concerning for ischemia at peak stress or in recovery. Resting EKG with normal sinus rhythm, LVH, diffuse ST and T wave abnormality anterolateral and inferior leads Hypertension at rest 176/105 Low risk scan Signed, Esmond Plants, MD, Ph.D [facility]    Cardiac Studies   Echo (09/10/16): Normal LV size with moderate LVH. LVEF 30-35% with global hypokinesis and grade 2 diastolic dysfunction. Mild regurgitation. Moderate mitral regurgitation. Mild left atrial enlargement. Normal RV size and function. Moderate pulmonary hypertension. Trivial pericardial effusion.  Patient Profile     61 y.o. female with history of Teresa Cook-stage renal disease on hemodialysis, DVT on warfarin, and breast cancer status post bilateral mastectomy chemotherapy, admitted with volume overload in the setting of acute systolic heart failure.  Assessment & Plan    Respiratory distress with acute systolic and diastolic heart failure Symptoms most likely due to volume overload in the setting low EF and uncontrolled hypertension. Symptoms have almost completely resolved.  HD again today for fluid removal.  Consider increasing furosemide; will defer to IM and nephrology  Escalate BP control  Non-ischemic cardiomyopathy with acute systolic and diastolic heart failure LVEF noted to be severely reduced at 30-35%. Myocardial perfusion stress test yesterday was without significant ischemia or scar, consistent with NICM.  Continue volume management with HD, per nephrology.  Increase losartan to 150 mg daily  Continue metoprolol succinate 100 mg daily; further uptitration as an outpatient as HR and BP allow.  Continue hydralazine and long-acting nitrate  No spironolactone in the setting of ESRD.  NSVT No further episodes on telemetry.  Myocardial perfusion stress test without significant ischemia or scar.  Continue medical therapy for NICM  Ensure electrolytes are adequately repleted to maintain K and Mg above 4.0 and 2.0, respectively.  Elevated troponin Minimal elevation (peak 0.09) non-specific and most likely due to supply-demand mismatch in the setting of HF exacerbation, uncontrolled hypertension, and ESRD. Stress test was reassuring.  No further work-up at this  time.  Hypertension BP remains suboptimally controlled.  Increase losartan to 150 mg daily.  Consider increasing hydralazine/isosorbide mononitrate as needed to optimize BP control.  ESRD  Proceed with hemodialysis again today.  Management per IM and nephrology.  DVT INR remains subtherapeutic.  Continue dosing per pharmacy.    Signed, Nelva Bush, MD  09/14/2016, 8:10 AM

## 2016-09-14 NOTE — Progress Notes (Signed)
Patient returned from dialysis.

## 2016-09-14 NOTE — Progress Notes (Signed)
Hd start, alert, no c/o, stable

## 2016-09-14 NOTE — Progress Notes (Signed)
Patient transferred to dialysis via bed.

## 2016-09-14 NOTE — Progress Notes (Signed)
  End of hd 

## 2016-09-14 NOTE — Progress Notes (Signed)
ANTICOAGULATION CONSULT NOTE - Follow Up Consult  Pharmacy Consult for warfarin Indication: atrial fibrillation  Allergies  Allergen Reactions  . Gabapentin Other (See Comments)    Seizure  . Adhesive [Tape] Itching    Silk tape is ok to use.    Patient Measurements: Height: 5\' 4"  (162.6 cm) Weight: 132 lb 11.5 oz (60.2 kg) IBW/kg (Calculated) : 54.7  Vital Signs: Temp: 98.3 F (36.8 C) (09/08 0519) Temp Source: Oral (09/08 0519) BP: 160/90 (09/08 0519) Pulse Rate: 73 (09/08 0519)  Labs:  Recent Labs  09/12/16 0352 09/13/16 0438 09/13/16 1219 09/14/16 0411  HGB  --   --  11.2*  --   HCT  --   --  33.4*  --   PLT  --   --  163  --   LABPROT 18.2* 17.5*  --  18.6*  INR 1.52 1.45  --  1.57  CREATININE  --   --  10.51*  --     Estimated Creatinine Clearance: 4.8 mL/min (A) (by C-G formula based on SCr of 10.51 mg/dL (H)).   Medical History: Past Medical History:  Diagnosis Date  . Anemia   . Anxiety   . Breast cancer ([place]) 01/2016   bilateral  . CHF (congestive heart failure) ([place])   . Chronic kidney disease   . Depression   . Dialysis patient ([place])   . DVT (deep venous thrombosis) ([facility])    left leg  . DVT (deep venous thrombosis) ([place]) 1985   right thigh  . Dysrhythmia   . Gout   . Headache   . HTN (hypertension)   . Hypertension   . Parathyroid abnormality ([place])   . Parathyroid disease ([place])   . Pneumonia 12/2015  . Psoriasis   . Renal insufficiency   . Sickle cell trait ([facility])    traits    Assessment: 61 year old patient on warfarin for atrial fibrillation.   Patient's home regimen:  Warfarin is 7.5mg  M,Tu,W,Th                                               Warfarin 5 mg on Fri, Sat, Sun.   Date INR Dose  9/3 1.81 5mg   9/4 1.86 3mg   9/5 1.69 7.5mg  9/6 1.52 7.5mg   9/7  1.45 10mg  9/8       1.57  Goal of Therapy:  INR 2-3 Monitor platelets by anticoagulation protocol: Yes   Plan:  INR subtherapeutic this AM.  Will give warfarin 10  mg x 1 dose this evening.   Will continue to follow up with INRs daily.   Olivia Canter [facility] Clinical Pharmacist 09/14/2016 9:54 AM

## 2016-09-14 NOTE — Discharge Summary (Signed)
[employer] [facility] [place] at [facility], 61 y.o., DOB 1955-07-07, MRN [id number]. Admission date: 09/09/2016 Discharge Date 09/14/2016 Primary MD Murlean Iba, MD Admitting Physician Saundra Shelling, MD  Admission Diagnosis  Acute pulmonary edema ([place]) [J81.0] SOB (shortness of breath) [R06.02] Hypoxia [R09.02] ESRD (end stage renal disease) on dialysis ([place]) [N18.6, Z99.2] Hypertensive urgency [I16.0] Community acquired pneumonia of right upper lobe of lung ([place]) [J18.1]  Discharge Diagnosis   Principal Problem:   Acute respiratory failure with hypoxia (HCC)    Acute on chronic systolic end-diastolic heart failure (HCC)   NSVT   ESRD on dialysis ([place])   Pressure injury of skin   NICM (nonischemic cardiomyopathy) ([place])  accelerated hypertension   Elevated troponin   PNA (pneumonia)   Accelerated hypertension   End-stage renal disease  History of DVT Possible pneumonia      Hospital Course  Teresa Cook  is a 61 y.o. female with a known history of end-stage renal disease on dialysis, atrial fibrillation, congestive heart failure, DVT left leg, gout, hypertension currently on Coumadin for anticoagulation presented to the emergency room with shortness of breath. Shortness of breath started early this morning and patient was unable to breathe. Her O2 saturation was 78% on nonrebreather mask when she was brought by the EMS. Patient was put on BiPAP in the emergency room and stabilized. Chest x-ray showed volume overload. Patient was admitted for further evaluation and therapy. She is a hemodialysis patient underwent dialysis for her fluid overload. She did have a clotted access and received TPA into bed. She will be receiving hemodialysis later today. If she tolerates that she will be discharged home. Patient also noticed to have non-sustained VT during hospitalization she was seen by cardiology and they recommended medical management. Patient had a stress  test which showed no significant abnormality. Currently she is stable to be discharged home.          Consults  cardiology, nephrology, vascular surgery  Significant Tests:  See full reports for all details     Nm Myocar Multi W/spect W/wall Motion / Ef  Result Date: 09/13/2016 Pharmacological myocardial perfusion imaging study with no significant  Ischemia GI uptake artifact noted Small region of fixed apical thinning on attenuation corrected images otherwise normal perfusion Normal wall motion, EF estimated at 44% No EKG changes concerning for ischemia at peak stress or in recovery. Resting EKG with normal sinus rhythm, LVH, diffuse ST and T wave abnormality anterolateral and inferior leads Hypertension at rest 176/105 Low risk scan Signed, Esmond Plants, MD, Ph.D [facility]   Dg Chest Portable 1 View  Result Date: 09/09/2016 CLINICAL DATA:  Acute dyspnea EXAM: PORTABLE CHEST 1 VIEW COMPARISON:  08/22/2016 FINDINGS: Dual-lumen left jugular central line extends into the SVC. Multifocal bilateral airspace opacity involving the central lung regions bilaterally, with more confluent dense consolidation in the right upper lobe. No large effusions. Hilar contours are obscured. Unchanged borderline cardiomegaly. IMPRESSION: New multifocal airspace opacities, greatest in the right upper lobe where there is confluent consolidation. This may represent pneumonia. Asymmetric alveolar edema is also a possibility. Electronically Signed   By: Andreas Newport M.D.   On: 09/09/2016 06:42   Dg Chest Port 1 View  Result Date: 08/22/2016 CLINICAL DATA:  Pneumonia. EXAM: PORTABLE CHEST 1 VIEW COMPARISON:  08/21/2016. FINDINGS: Left IJ dialysis catheter noted with tip at the cavoatrial junction in stable position. Stable mild cardiomegaly. Interim near complete clearing of diffuse interstitial prominence suggesting clearing CHF. Small  left pleural effusion. Pneumothorax P IMPRESSION: 1.  Dialysis catheter stable  position. 2. Interval near complete clearing of diffuse interstitial prominence suggesting clearing CHF. Small left pleural effusion. Electronically Signed   By: Marcello Moores  Register   On: 08/22/2016 07:03   Dg Chest Port 1 View  Result Date: 08/21/2016 CLINICAL DATA:  Pneumonia.  Dialysis with atrial fibrillation. EXAM: PORTABLE CHEST 1 VIEW COMPARISON:  03/06/2016 FINDINGS: 0820 hours. Cardiopericardial silhouette is at upper limits of normal for size. Asymmetric diffuse right central airspace disease. No substantial airspace opacity in the left lung. Left dialysis catheter remains in place. Bones are demineralized. Telemetry leads overlie the chest. IMPRESSION: Imaging features suggest diffuse right-sided pneumonia. Asymmetric edema less likely. Electronically Signed   By: Misty Stanley M.D.   On: 08/21/2016 09:05       Today   Subjective:   Teresa Cook patient's breathing much improved denies any complaints to go home  Objective:   Blood pressure 136/90, pulse 84, temperature 98.6 F (37 C), temperature source Oral, resp. rate 19, height 5\' 4"  (1.626 m), weight 132 lb 11.5 oz (60.2 kg), SpO2 98 %.  .  Intake/Output Summary (Last 24 hours) at 09/14/16 1307 Last data filed at 09/14/16 1145  Gross per 24 hour  Intake              300 ml  Output              490 ml  Net             -190 ml    Exam VITAL SIGNS: Blood pressure 136/90, pulse 84, temperature 98.6 F (37 C), temperature source Oral, resp. rate 19, height 5\' 4"  (1.626 m), weight 132 lb 11.5 oz (60.2 kg), SpO2 98 %.  GENERAL:  61 y.o.-year-old patient lying in the bed with no acute distress.  EYES: Pupils equal, round, reactive to light and accommodation. No scleral icterus. Extraocular muscles intact.  HEENT: Head atraumatic, normocephalic. Oropharynx and nasopharynx clear.  NECK:  Supple, no jugular venous distention. No thyroid enlargement, no tenderness.  LUNGS: Normal breath sounds bilaterally, no wheezing,  rales,rhonchi or crepitation. No use of accessory muscles of respiration.  CARDIOVASCULAR: S1, S2 normal. No murmurs, rubs, or gallops.  ABDOMEN: Soft, nontender, nondistended. Bowel sounds present. No organomegaly or mass.  EXTREMITIES: No pedal edema, cyanosis, or clubbing.  NEUROLOGIC: Cranial nerves II through XII are intact. Muscle strength 5/5 in all extremities. Sensation intact. Gait not checked.  PSYCHIATRIC: The patient is alert and oriented x 3.  SKIN: No obvious rash, lesion, or ulcer.   Data Review     CBC w Diff: Lab Results  Component Value Date   WBC 6.5 09/13/2016   HGB 11.2 (L) 09/13/2016   HGB 11.7 (L) 02/03/2014   HCT 33.4 (L) 09/13/2016   HCT 35.9 02/03/2014   PLT 163 09/13/2016   PLT 205 02/03/2014   LYMPHOPCT 32 08/21/2016   LYMPHOPCT 20.1 02/03/2014   BANDSPCT 0 09/22/2015   MONOPCT 5 08/21/2016   MONOPCT 8.4 02/03/2014   EOSPCT 2 08/21/2016   EOSPCT 1.3 02/03/2014   BASOPCT 1 08/21/2016   BASOPCT 0.3 02/03/2014   CMP: Lab Results  Component Value Date   NA 140 09/13/2016   NA 138 02/03/2014   K 4.6 09/13/2016   K 4.0 02/03/2014   CL 104 09/13/2016   CL 102 02/03/2014   CO2 23 09/13/2016   CO2 23 02/03/2014   BUN 60 (H) 09/13/2016  BUN 21 (H) 02/03/2014   CREATININE 10.51 (H) 09/13/2016   CREATININE 5.11 (H) 02/03/2014   PROT 8.0 09/09/2016   PROT 7.4 02/02/2014   ALBUMIN 3.3 (L) 09/13/2016   ALBUMIN 3.5 02/02/2014   BILITOT 0.8 09/09/2016   BILITOT 0.3 02/02/2014   ALKPHOS 98 09/09/2016   ALKPHOS 185 (H) 02/02/2014   AST 35 09/09/2016   AST 22 02/02/2014   ALT 28 09/09/2016   ALT 20 02/02/2014  .  Micro Results Recent Results (from the past 240 hour(s))  Culture, blood (routine x 2)     Status: None   Collection Time: 09/09/16  6:58 AM  Result Value Ref Range Status   Specimen Description BLOOD RIGHT ANTECUBITAL  Final   Special Requests   Final    BOTTLES DRAWN AEROBIC AND ANAEROBIC Blood Culture adequate volume   Culture  NO GROWTH 5 DAYS  Final   Report Status 09/14/2016 FINAL  Final  Culture, blood (routine x 2)     Status: None   Collection Time: 09/09/16  7:03 AM  Result Value Ref Range Status   Specimen Description BLOOD BLOOD RIGHT WRIST  Final   Special Requests   Final    BOTTLES DRAWN AEROBIC AND ANAEROBIC Blood Culture adequate volume   Culture NO GROWTH 5 DAYS  Final   Report Status 09/14/2016 FINAL  Final  Urine culture     Status: Abnormal   Collection Time: 09/10/16  3:21 PM  Result Value Ref Range Status   Specimen Description URINE, RANDOM  Final   Special Requests NONE  Final   Culture MULTIPLE SPECIES PRESENT, SUGGEST RECOLLECTION (A)  Final   Report Status 09/12/2016 FINAL  Final        Code Status Orders        Start     Ordered   09/09/16 0806  Full code  Continuous     09/09/16 0805    Code Status History    Date Active Date Inactive Code Status Order ID Comments User Context   08/21/2016 11:02 AM 08/23/2016  8:29 PM Full Code [id number]  [name], MD Inpatient   02/29/2016  5:22 PM 03/01/2016  8:55 PM DNR [id number]  Bettey Costa, MD Inpatient   02/29/2016  5:04 PM 02/29/2016  5:22 PM Full Code [id number]  Bettey Costa, MD Inpatient   01/23/2016  6:33 PM 01/29/2016  9:45 PM Full Code [id number]  Hubbard Robinson, MD Inpatient   01/22/2016  3:43 PM 01/23/2016  6:33 PM Full Code [id number]  Dustin Flock, MD Inpatient   12/31/2015  9:39 PM 01/03/2016  9:31 PM Full Code [id number]  [name], MD Inpatient   12/15/2015 10:40 PM 12/28/2015  6:52 PM Full Code [id number]  Hugelmeyer, Ubaldo Glassing, DO Inpatient   08/31/2015  4:26 AM 09/02/2015  2:35 PM Full Code [id number]  Harrie Foreman, MD Inpatient   08/27/2015  4:06 AM 08/29/2015  3:57 PM Full Code [id number]  Saundra Shelling, MD ED   07/09/2015  2:44 PM 07/12/2015  7:55 PM Full Code [id number]  Laverle Hobby, MD ED   09/19/2014 11:47 AM 09/20/2014  6:25 PM Full Code [id number]  Dustin Flock, MD ED   08/29/2014  2:28 PM  08/30/2014  5:43 PM Full Code [id number]  Fritzi Mandes, MD Inpatient    Advance Directive Documentation     Most Recent Value  Type of Advance Directive  Healthcare Power of Attorney, Living will  Pre-existing out of facility DNR order (yellow form or  pink MOST form)  -  "MOST" Form in Place?  -          Follow-up Information    Murlean Iba, MD Follow up in 4 day(s).   Specialty:  Internal Medicine Contact information: [place] [address] [phone]           Discharge Medications   Allergies as of 09/14/2016      Reactions   Gabapentin Other (See Comments)   Seizure   Adhesive [tape] Itching   Silk tape is ok to use.      Medication List    STOP taking these medications   metoprolol tartrate 100 MG tablet Commonly known as:  LOPRESSOR     TAKE these medications   acetaminophen 325 MG tablet Commonly known as:  TYLENOL Take 2 tablets (650 mg total) by mouth every 6 (six) hours as needed for mild pain (or Fever >/= 101).   amoxicillin-clavulanate 500-125 MG tablet Commonly known as:  AUGMENTIN Take 1 tablet (500 mg total) by mouth at bedtime.   cinacalcet 60 MG tablet Commonly known as:  SENSIPAR Take 60 mg by mouth 3 (three) times daily with meals.   feeding supplement (ENSURE ENLIVE) Liqd Take 237 mLs by mouth 3 (three) times daily between meals. What changed:  when to take this   hydrALAZINE 50 MG tablet Commonly known as:  APRESOLINE Take 1 tablet (50 mg total) by mouth every 8 (eight) hours.   HYDROcodone-acetaminophen 5-325 MG tablet Commonly known as:  NORCO/VICODIN Take 1 tablet by mouth every 4 (four) hours as needed for moderate pain.   isosorbide mononitrate 30 MG 24 hr tablet Commonly known as:  IMDUR Take 1 tablet (30 mg total) by mouth daily.   letrozole 2.5 MG tablet Commonly known as:  FEMARA Take 1 tablet (2.5 mg total) by mouth daily.   losartan 50 MG tablet Commonly known as:  COZAAR Take  3 tablets (150 mg total) by mouth daily. What changed:  medication strength  how much to take  when to take this   metoprolol succinate 100 MG 24 hr tablet Commonly known as:  TOPROL-XL Take 1 tablet (100 mg total) by mouth daily. Take with or immediately following a meal.   ondansetron 8 MG tablet Commonly known as:  ZOFRAN Take by mouth every 8 (eight) hours as needed for nausea or vomiting.   pantoprazole 40 MG tablet Commonly known as:  PROTONIX Take 1 tablet (40 mg total) by mouth daily. What changed:  when to take this   prochlorperazine 10 MG tablet Commonly known as:  COMPAZINE Take 10 mg by mouth every 6 (six) hours as needed for nausea or vomiting.   sertraline 50 MG tablet Commonly known as:  ZOLOFT Take 3 tablets (150 mg total) by mouth daily. What changed:  how much to take   sevelamer carbonate 800 MG tablet Commonly known as:  RENVELA Take 2,400 mg by mouth 3 (three) times daily with meals. Tid with meals and with 1 snack   traMADol 50 MG tablet Commonly known as:  ULTRAM Take 1 tablet (50 mg total) by mouth every 6 (six) hours as needed for moderate pain.   UNABLE TO FIND Med Name: Prosthetic Bra with Inserts   warfarin 5 MG tablet Commonly known as:  COUMADIN take 5 mg on friday, saturday, sunday. and 7.5 mg on monday, tuesday, wednesday, thursday What changed:  Another medication with the same name was added. Make sure you  understand how and when to take each.   warfarin 10 MG tablet Commonly known as:  COUMADIN Take 1 tablet (10 mg total) by mouth one time only at 6 PM. What changed:  You were already taking a medication with the same name, and this prescription was added. Make sure you understand how and when to take each.   zolpidem 5 MG tablet Commonly known as:  AMBIEN Take 1 tablet (5 mg total) by mouth at bedtime.            Discharge Care Instructions        Start     Ordered   09/14/16 0000  HYDROcodone-acetaminophen  (NORCO/VICODIN) 5-325 MG tablet  Every 4 hours PRN    Comments:  Take one tablet prior to hd   09/14/16 1007   09/14/16 0000  isosorbide mononitrate (IMDUR) 30 MG 24 hr tablet  Daily     09/14/16 1007   09/14/16 0000  losartan (COZAAR) 50 MG tablet  Daily     09/14/16 1007   09/14/16 0000  metoprolol succinate (TOPROL-XL) 100 MG 24 hr tablet  Daily     09/14/16 1007   09/14/16 0000  warfarin (COUMADIN) 10 MG tablet  One time only - 1800    Comments:  Take 10 mg x 3 days then resume previous coumadin schedule   09/14/16 1007   09/14/16 0000  hydrALAZINE (APRESOLINE) 50 MG tablet  Every 8 hours     09/14/16 1007   09/14/16 0000  amoxicillin-clavulanate (AUGMENTIN) 500-125 MG tablet  Daily at bedtime     09/14/16 1007         Total Time in preparing paper work, data evaluation and todays exam - 35 minutes  Dustin Flock M.D on 09/14/2016 at 1:07 [facility]  [place]  [phone]

## 2016-09-14 NOTE — Progress Notes (Signed)
Post hd vitals 

## 2016-09-14 NOTE — Progress Notes (Signed)
Pre hd assessment  

## 2016-09-17 LAB — METANEPHRINES, URINE, 24 HOUR
Metaneph Total, Ur: 66 ug/L
Metanephrines, 24H Ur: 13 ug/24 hr — ABNORMAL LOW (ref 45–290)
NORMETANEPHRINE 24H UR: 37 ug/(24.h) — AB (ref 82–500)
Normetanephrine, Ur: 184 ug/L
Total Volume: 200

## 2016-09-19 ENCOUNTER — Encounter: Payer: Self-pay | Admitting: *Deleted

## 2016-09-19 ENCOUNTER — Emergency Department
Admission: EM | Admit: 2016-09-19 | Discharge: 2016-09-19 | Disposition: A | Discharge status: Home / Self Care | Payer: Medicare Other | Attending: Emergency Medicine | Admitting: Emergency Medicine

## 2016-09-19 DIAGNOSIS — Z7901 Long term (current) use of anticoagulants: Secondary | ICD-10-CM | POA: Insufficient documentation

## 2016-09-19 DIAGNOSIS — I132 Hypertensive heart and chronic kidney disease with heart failure and with stage 5 chronic kidney disease, or end stage renal disease: Secondary | ICD-10-CM | POA: Diagnosis not present

## 2016-09-19 DIAGNOSIS — I10 Essential (primary) hypertension: Secondary | ICD-10-CM

## 2016-09-19 DIAGNOSIS — Z79899 Other long term (current) drug therapy: Secondary | ICD-10-CM | POA: Diagnosis not present

## 2016-09-19 DIAGNOSIS — N186 End stage renal disease: Secondary | ICD-10-CM | POA: Insufficient documentation

## 2016-09-19 DIAGNOSIS — Z992 Dependence on renal dialysis: Secondary | ICD-10-CM | POA: Diagnosis not present

## 2016-09-19 DIAGNOSIS — I5022 Chronic systolic (congestive) heart failure: Secondary | ICD-10-CM | POA: Diagnosis not present

## 2016-09-19 MED ORDER — ACETAMINOPHEN 325 MG PO TABS
650.0000 mg | ORAL_TABLET | Freq: Once | ORAL | Status: AC
  Administered 2016-09-19: 650 mg via ORAL
  Filled 2016-09-19: qty 2

## 2016-09-19 NOTE — ED Provider Notes (Signed)
[facility] Emergency Department Provider Note ____________________________________________   First MD Initiated Contact with Patient 09/19/16 1823     (approximate)  I have reviewed the triage vital signs and the nursing notes.   HISTORY  Chief Complaint Hypertension    HPI Teresa Cook is a 61 y.o. female With history of end-stage renal disease on dialysis who presents with hypertension, gradual onset, measured to 170s over 110s, and associated with pressure in her head.  Patient states in the past she has felt this way at the beginning of becoming fluid overloaded, so she got concerned and went to her dialysis center to see what could be done. Patient was given a dose of clonidine there and then came to the emergency department. She denies associated chest pain or shortness of breath, extremity swelling, lightheadedness, weakness, or fever. She does report some frontal pressure-like headache but no vision changes, nausea or vomiting, or other acute symptoms. Patient states she has had similar headaches before.  Patient had her last dialysis yesterday and is scheduled for dialysis tomorrow.   Past Medical History:  Diagnosis Date  . Anemia   . Anxiety   . Breast cancer ([place]) 01/2016   bilateral  . CHF (congestive heart failure) ([place])   . Chronic kidney disease   . Depression   . Dialysis patient ([place])   . DVT (deep venous thrombosis) (H[facility])    left leg  . DVT (deep venous thrombosis) ([place]) 1985   right thigh  . Dysrhythmia   . Gout   . Headache   . HTN (hypertension)   . Hypertension   . Parathyroid abnormality ([place])   . Parathyroid disease ([place])   . Pneumonia 12/2015  . Psoriasis   . Renal insufficiency   . Sickle cell trait ([place])    traits    Patient Active Problem List   Diagnosis Date Noted  . NICM (nonischemic cardiomyopathy) ([place]) 09/12/2016  . Elevated troponin 09/12/2016  . PNA (pneumonia) 09/12/2016  . Accelerated  hypertension 09/12/2016  . Pressure injury of skin 09/11/2016  . HTN (hypertension) 02/29/2016  . Moderate recurrent major depression ([place]) 02/29/2016  . Panic attacks 02/29/2016  . Scalp lesion   . Breast cancer ([place]) 01/23/2016  . ESRD (end stage renal disease) ([place]) 01/22/2016  . Severe anemia 01/01/2016  . Seizure ([place]) 12/31/2015  . Healthcare-associated pneumonia   . Bloodstream infection due to port-a-cath, initial encounter   . Sepsis ([place]) 12/15/2015  . Chest pain 08/31/2015  . SIRS (systemic inflammatory response syndrome) ([place]) 08/27/2015  . Malignant neoplasm of upper-inner quadrant of left breast in female, estrogen receptor positive ([place]) 08/05/2015  . Pulmonary edema 07/09/2015  . Dizziness 03/03/2015  . Seizures ([place]) 03/03/2015  . Spells 11/11/2014  . Acute respiratory failure with hypoxia ([place]) 09/19/2014  . Left ventricular dysfunction 09/06/2014  . Acute pulmonary edema ([place]) 08/30/2014  . Respiratory failure ([place]) 08/29/2014  . Respiratory difficulty 08/29/2014  . Acute on chronic systolic heart failure ([place]) 05/16/2014  . Hypertension 05/16/2014  . Renal failure 05/16/2014  . Anxiety 05/16/2014  . Sickle cell trait ([place]) 03/03/2014  . ESRD on dialysis ([place]) 11/02/2013  . Dysgeusia 01/27/2013  . Weight loss 01/27/2013  . Resting tremor 08/13/2012  . Depression 04/30/2012  . 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 Right leg DVT ([place]) 04/30/2012    Past Surgical History:  Procedure Laterality Date  . ABDOMINAL HYSTERECTOMY  1980  . APPENDECTOMY    . BREAST BIOPSY Left  10/28/2013   benign  . BREAST EXCISIONAL BIOPSY Left 2002   benign  . INSERTION OF DIALYSIS CATHETER  2014  . LIPOMA EXCISION N/A 01/23/2016   Procedure: EXCISION LIPOMA;  Surgeon: Hubbard Robinson, MD;  Location: [facility] ORS;  Service: General;  Laterality: N/A;  . MASTECTOMY W/ SENTINEL NODE BIOPSY Bilateral 01/23/2016   Procedure: bilateral MASTECTOMY WITH  bilateral  SENTINEL LYMPH NODE BIOPSY possible left axillary node dissection forehead lipoma removal;  Surgeon: Hubbard Robinson, MD;  Location: [facility] ORS;  Service: General;  Laterality: Bilateral;  . PARTIAL HYSTERECTOMY    . PERIPHERAL VASCULAR CATHETERIZATION N/A 12/25/2015   Procedure: Dialysis/Perma Catheter Insertion;  Surgeon: Algernon Huxley, MD;  Location: [place] CV LAB;  Service: Cardiovascular;  Laterality: N/A;  . PERIPHERAL VASCULAR CATHETERIZATION Left 01/22/2016   Procedure: Dialysis/Perma Catheter Insertion;  Surgeon: Algernon Huxley, MD;  Location: [place] [facility];  Service: Cardiovascular;  Laterality: Left;  . PERIPHERAL VASCULAR CATHETERIZATION N/A 01/26/2016   Procedure: Dialysis/Perma Catheter Insertion;  Surgeon: Katha Cabal, MD;  Location: [place] CV LAB;  Service: Cardiovascular;  Laterality: N/A;  . PORT-A-CATH REMOVAL N/A 12/20/2015   Procedure: REMOVAL PORT-A-CATH;  Surgeon: Hubbard Robinson, MD;  Location: [facility] ORS;  Service: General;  Laterality: N/A;  left    . PORTACATH PLACEMENT Left 08/21/2015   Procedure: INSERTION PORT-A-CATH;  Surgeon: Hubbard Robinson, MD;  Location: [facility] ORS;  Service: General;  Laterality: Left;  . REMOVAL OF A DIALYSIS CATHETER  2017    Prior to Admission medications   Medication Sig Start Date End Date Taking? Authorizing Provider  acetaminophen (TYLENOL) 325 MG tablet Take 2 tablets (650 mg total) by mouth every 6 (six) hours as needed for mild pain (or Fever >/= 101). 12/27/15  Yes [name], [name], MD  amoxicillin-clavulanate (AUGMENTIN) 500-125 MG tablet Take 1 tablet by mouth at bedtime. 09/16/16  Yes [provider]  feeding supplement, ENSURE ENLIVE, (ENSURE ENLIVE) LIQD Take 237 mLs by mouth 3 (three) times daily between meals. Patient taking differently: Take 237 mLs by mouth daily.  12/27/15  Yes [name], [name], MD  hydrALAZINE (APRESOLINE) 50 MG tablet Take 1 tablet (50 mg total) by mouth every 8 (eight) hours.  09/14/16  Yes Dustin Flock, MD  HYDROcodone-acetaminophen (NORCO/VICODIN) 5-325 MG tablet Take 1 tablet by mouth every 4 (four) hours as needed for moderate pain. 09/14/16  Yes Dustin Flock, MD  isosorbide mononitrate (IMDUR) 30 MG 24 hr tablet Take 1 tablet (30 mg total) by mouth daily. 09/14/16  Yes Dustin Flock, MD  losartan (COZAAR) 50 MG tablet Take 3 tablets (150 mg total) by mouth daily. 09/14/16  Yes Dustin Flock, MD  metoprolol succinate (TOPROL-XL) 100 MG 24 hr tablet Take 1 tablet (100 mg total) by mouth daily. Take with or immediately following a meal. 09/14/16  Yes Dustin Flock, MD  ondansetron (ZOFRAN) 8 MG tablet Take by mouth every 8 (eight) hours as needed for nausea or vomiting.   Yes [provider]  pantoprazole (PROTONIX) 40 MG tablet Take 1 tablet (40 mg total) by mouth daily. Patient taking differently: Take 40 mg by mouth 2 (two) times daily.  03/01/16  Yes [name], MD  prochlorperazine (COMPAZINE) 10 MG tablet Take 10 mg by mouth every 6 (six) hours as needed for nausea or vomiting.   Yes [provider]  sertraline (ZOLOFT) 50 MG tablet Take 3 tablets (150 mg total) by mouth daily. Patient taking differently: Take 200 mg  by mouth daily.  03/01/16  Yes [name], MD  sevelamer carbonate (RENVELA) 800 MG tablet Take 2,400 mg by mouth 3 (three) times daily with meals. Tid with meals and with 1 snack    Yes [provider]  traMADol (ULTRAM) 50 MG tablet Take 1 tablet (50 mg total) by mouth every 6 (six) hours as needed for moderate pain. Patient taking differently: Take 100 mg by mouth at bedtime. May also take as needed for pain. 08/23/16  Yes [name], MD  UNABLE TO FIND Med Name: Prosthetic Bra with Inserts 04/23/16  Yes [name], MD  warfarin (COUMADIN) 5 MG tablet take 5 mg on friday, saturday, sunday. and 7.5 mg on monday, tuesday, wednesday, thursday 11/03/13  Yes [provider]  zolpidem  (AMBIEN) 5 MG tablet Take 1 tablet (5 mg total) by mouth at bedtime. 03/01/16  Yes [name], MD  letrozole [facility]) 2.5 MG tablet Take 1 tablet (2.5 mg total) by mouth daily. Patient not taking: Reported on 09/19/2016 05/07/16   [name], MD  warfarin (COUMADIN) 10 MG tablet Take 1 tablet (10 mg total) by mouth one time only at 6 PM. Patient not taking: Reported on 09/19/2016 09/14/16   Dustin Flock, MD    Allergies Gabapentin and Adhesive [tape]  Family History  Problem Relation Age of Onset  . Stroke Mother   . CVA Mother   . Hypertension Mother   . Hypertension Father   . Hypertension Sister   . Diabetes Sister   . Cancer Sister 24       Breast  . Stroke Brother   . Hypertension Brother   . Breast cancer Maternal Aunt 70    Social History Social History  Substance Use Topics  . Smoking status: Never Smoker  . Smokeless tobacco: Never Used  . Alcohol use No    Review of Systems  Constitutional: No fever/chills Eyes: No visual changes. ENT: No sore throat. Cardiovascular: Denies chest pain. Respiratory: Denies shortness of breath. Gastrointestinal: No nausea, no vomiting.  No diarrhea.  Genitourinary: Negative for dysuria.  Musculoskeletal: Negative for back pain. Skin: Negative for rash. Neurological: Positive for headache.   ____________________________________________   PHYSICAL EXAM:  VITAL SIGNS: ED Triage Vitals  Enc Vitals Group     BP 09/19/16 1758 (!) 187/115     Pulse Rate 09/19/16 1758 77     Resp 09/19/16 1758 18     Temp 09/19/16 1758 98.2 F (36.8 C)     Temp Source 09/19/16 1758 Oral     SpO2 09/19/16 1758 97 %     Weight 09/19/16 1759 120 lb (54.4 kg)     Height 09/19/16 1759 5\' 4"  (1.626 m)     Head Circumference --      Peak Flow --      Pain Score 09/19/16 1758 8     Pain Loc --      Pain Edu? --      Excl. in [place]? --     Constitutional: Alert and oriented. Well appearing and in no acute distress. Eyes:  Conjunctivae are normal.  Head: Atraumatic. Nose: No congestion/rhinnorhea. Mouth/Throat: Mucous membranes are moist.   Neck: Normal range of motion.  Cardiovascular: Normal rate, regular rhythm. Grossly normal heart sounds.  Good peripheral circulation. Respiratory: Normal respiratory effort.  No retractions. Lungs CTAB. Gastrointestinal: Soft and nontender. No distention.  Genitourinary: No CVA tenderness. Musculoskeletal: No lower extremity edema.  Extremities warm and well perfused.  Neurologic:  Normal speech and language. No gross focal neurologic deficits are appreciated.  Skin:  Skin is warm and dry. No rash noted. Psychiatric: Mood and affect are normal. Speech and behavior are normal.  ____________________________________________   LABS (all labs ordered are listed, but only abnormal results are displayed)  Labs Reviewed - No data to display ____________________________________________  EKG  ED ECG REPORT I, Arta Silence, the attending physician, personally viewed and interpreted this ECG.  Date: 09/19/2016 EKG Time: 1757 Rate: 75 Rhythm: normal sinus rhythm QRS Axis: normal Intervals: normal ST/T Wave abnormalities: LVH with secondary repolarization abnormality, T wave inversions in I, II and V3-V6 Narrative Interpretation: no evidence of acute ischemia; no significant change when compared to EKG of 09/10/2016  ____________________________________________  RADIOLOGY    ____________________________________________   PROCEDURES  Procedure(s) performed: No    Critical Care performed: No ____________________________________________   INITIAL IMPRESSION / ASSESSMENT AND PLAN / ED COURSE  Pertinent labs & imaging results that were available during my care of the patient were reviewed by me and considered in my medical decision making (see chart for details).  61 year old female with history of ESRD on dialysis presents with concern of  hypertension, and pressure in her head which made her think she might be about to go into fluid overload. She got clonidine a few hours ago while at her dialysis center. On exam, currently blood pressure is only mildly elevated, other vital signs are normal, patient is extremely well-appearing. The lungs are clear, there is no edema, and there are no other significant exam findings.  she reports mild frontal headache but she has had this before, and her neuro exam is nonfocal. No concerning associated symptoms. EKG is unchanged from prior.  Overall, there is no clinical evidence for fluid overload, or for any end organ damage or hypertensive emergency. I gave the patient the option of obtaining chest x-ray and basic labs to ensure that there were no acute findings, but patient declined and stated she preferred to just get something for the headache and be discharged, as she is feeling much better.  Will give APAP and if pt continues to feel better, d/c home.  She has dialysis scheduled tomorrow.     ----------------------------------------- 8:57 PM on 09/19/2016 -----------------------------------------  She states the headache is about the same, but she feels comfortable to go home. States that she gets this headache every time at the end of dialysis, she has had multiple times before. It was gradual in onset yesterday. At this time blood pressure remained stable and patient is comfortable. She will get her dialysis tomorrow as scheduled. Return precautions given.  ____________________________________________   FINAL CLINICAL IMPRESSION(S) / ED DIAGNOSES  Final diagnoses:  Hypertension, unspecified type      NEW MEDICATIONS STARTED DURING THIS VISIT:  Discharge Medication List as of 09/19/2016  8:58 PM       Note:  This document was prepared using Dragon voice recognition software and may include unintentional dictation errors.    Arta Silence, MD 09/19/16 2156

## 2016-09-19 NOTE — ED Notes (Signed)
MD at the bedside to discuss pt discharge

## 2016-09-19 NOTE — Discharge Instructions (Signed)
Go to your dialysis tomorrow as scheduled. Return to the ER for any new or worsening headache, chest discomfort, difficulty breathing, persistent elevated blood pressures, or any other new or worsening symptoms that concern you.

## 2016-09-19 NOTE — ED Triage Notes (Signed)
Pt arrives via EMS, states she was recently admitted to hospital for HTN, states since she got home her BP has been elevated, states she went to dialysis to see if they could help and they gave her a clonidine at 4:50 pm, states headache at present, states she recently was diagnosed with CHF and her BP meds were changed, dialysis pt MWF

## 2016-11-19 DIAGNOSIS — Z87898 Personal history of other specified conditions: Secondary | ICD-10-CM | POA: Insufficient documentation

## 2017-01-12 ENCOUNTER — Inpatient Hospital Stay
Admission: EM | Admit: 2017-01-12 | Discharge: 2017-01-14 | DRG: 291 | Disposition: A | Discharge status: Home / Self Care | Payer: Medicare Other | Attending: Internal Medicine | Admitting: Internal Medicine

## 2017-01-12 ENCOUNTER — Emergency Department: Payer: Medicare Other

## 2017-01-12 ENCOUNTER — Encounter: Payer: Self-pay | Admitting: Emergency Medicine

## 2017-01-12 DIAGNOSIS — N189 Chronic kidney disease, unspecified: Secondary | ICD-10-CM

## 2017-01-12 DIAGNOSIS — Z853 Personal history of malignant neoplasm of breast: Secondary | ICD-10-CM

## 2017-01-12 DIAGNOSIS — E43 Unspecified severe protein-calorie malnutrition: Secondary | ICD-10-CM

## 2017-01-12 DIAGNOSIS — D631 Anemia in chronic kidney disease: Secondary | ICD-10-CM | POA: Diagnosis present

## 2017-01-12 DIAGNOSIS — E215 Disorder of parathyroid gland, unspecified: Secondary | ICD-10-CM | POA: Diagnosis present

## 2017-01-12 DIAGNOSIS — Z8673 Personal history of transient ischemic attack (TIA), and cerebral infarction without residual deficits: Secondary | ICD-10-CM

## 2017-01-12 DIAGNOSIS — I509 Heart failure, unspecified: Secondary | ICD-10-CM

## 2017-01-12 DIAGNOSIS — F419 Anxiety disorder, unspecified: Secondary | ICD-10-CM | POA: Diagnosis present

## 2017-01-12 DIAGNOSIS — I5043 Acute on chronic combined systolic (congestive) and diastolic (congestive) heart failure: Secondary | ICD-10-CM | POA: Diagnosis present

## 2017-01-12 DIAGNOSIS — I132 Hypertensive heart and chronic kidney disease with heart failure and with stage 5 chronic kidney disease, or end stage renal disease: Principal | ICD-10-CM | POA: Diagnosis present

## 2017-01-12 DIAGNOSIS — I739 Peripheral vascular disease, unspecified: Secondary | ICD-10-CM | POA: Diagnosis present

## 2017-01-12 DIAGNOSIS — I248 Other forms of acute ischemic heart disease: Secondary | ICD-10-CM | POA: Diagnosis present

## 2017-01-12 DIAGNOSIS — Z7901 Long term (current) use of anticoagulants: Secondary | ICD-10-CM

## 2017-01-12 DIAGNOSIS — E1122 Type 2 diabetes mellitus with diabetic chronic kidney disease: Secondary | ICD-10-CM | POA: Diagnosis present

## 2017-01-12 DIAGNOSIS — I340 Nonrheumatic mitral (valve) insufficiency: Secondary | ICD-10-CM | POA: Diagnosis present

## 2017-01-12 DIAGNOSIS — Z86718 Personal history of other venous thrombosis and embolism: Secondary | ICD-10-CM

## 2017-01-12 DIAGNOSIS — N179 Acute kidney failure, unspecified: Secondary | ICD-10-CM | POA: Diagnosis present

## 2017-01-12 DIAGNOSIS — I480 Paroxysmal atrial fibrillation: Secondary | ICD-10-CM | POA: Diagnosis present

## 2017-01-12 DIAGNOSIS — N2581 Secondary hyperparathyroidism of renal origin: Secondary | ICD-10-CM | POA: Diagnosis present

## 2017-01-12 DIAGNOSIS — D573 Sickle-cell trait: Secondary | ICD-10-CM | POA: Diagnosis present

## 2017-01-12 DIAGNOSIS — N186 End stage renal disease: Secondary | ICD-10-CM | POA: Diagnosis present

## 2017-01-12 DIAGNOSIS — E1151 Type 2 diabetes mellitus with diabetic peripheral angiopathy without gangrene: Secondary | ICD-10-CM | POA: Diagnosis present

## 2017-01-12 DIAGNOSIS — J9602 Acute respiratory failure with hypercapnia: Secondary | ICD-10-CM | POA: Diagnosis present

## 2017-01-12 DIAGNOSIS — Z681 Body mass index (BMI) 19 or less, adult: Secondary | ICD-10-CM

## 2017-01-12 DIAGNOSIS — Z79899 Other long term (current) drug therapy: Secondary | ICD-10-CM

## 2017-01-12 DIAGNOSIS — G43909 Migraine, unspecified, not intractable, without status migrainosus: Secondary | ICD-10-CM | POA: Diagnosis present

## 2017-01-12 DIAGNOSIS — R0603 Acute respiratory distress: Secondary | ICD-10-CM

## 2017-01-12 DIAGNOSIS — Z91048 Other nonmedicinal substance allergy status: Secondary | ICD-10-CM

## 2017-01-12 DIAGNOSIS — J9600 Acute respiratory failure, unspecified whether with hypoxia or hypercapnia: Secondary | ICD-10-CM

## 2017-01-12 DIAGNOSIS — M109 Gout, unspecified: Secondary | ICD-10-CM | POA: Diagnosis present

## 2017-01-12 DIAGNOSIS — J810 Acute pulmonary edema: Secondary | ICD-10-CM

## 2017-01-12 DIAGNOSIS — Z888 Allergy status to other drugs, medicaments and biological substances status: Secondary | ICD-10-CM

## 2017-01-12 DIAGNOSIS — J9601 Acute respiratory failure with hypoxia: Secondary | ICD-10-CM | POA: Diagnosis present

## 2017-01-12 DIAGNOSIS — E782 Mixed hyperlipidemia: Secondary | ICD-10-CM | POA: Diagnosis present

## 2017-01-12 DIAGNOSIS — F329 Major depressive disorder, single episode, unspecified: Secondary | ICD-10-CM | POA: Diagnosis present

## 2017-01-12 DIAGNOSIS — Z992 Dependence on renal dialysis: Secondary | ICD-10-CM

## 2017-01-12 DIAGNOSIS — I482 Chronic atrial fibrillation: Secondary | ICD-10-CM | POA: Diagnosis present

## 2017-01-12 DIAGNOSIS — G40909 Epilepsy, unspecified, not intractable, without status epilepticus: Secondary | ICD-10-CM | POA: Diagnosis present

## 2017-01-12 MED ORDER — FUROSEMIDE 10 MG/ML IJ SOLN
80.0000 mg | Freq: Once | INTRAMUSCULAR | Status: AC
  Administered 2017-01-13: 80 mg via INTRAVENOUS
  Filled 2017-01-12: qty 8

## 2017-01-12 NOTE — ED Provider Notes (Signed)
[facility] Emergency Department Provider Note   ____________________________________________   First MD Initiated Contact with Patient 01/12/17 2342     (approximate)  I have reviewed the triage vital signs and the nursing notes.   HISTORY  Chief Complaint Respiratory Distress  Level 5 caveat: History limited by respiratory distress  HPI Teresa Cook is a 62 y.o. female brought to the ED from home via EMS with a chief complaint of acute respiratory distress.  Patient has a history of ESRD on HD M/W/F on warfarin who complains of shortness of breath for the past hour.  Also complaining of dry cough.  Denies associated fever, chills, chest pain, abdominal pain, nausea, vomiting.  Still produces urine.  Denies recent travel or trauma.  EMS applied 2 inch nitroglycerin paste; patient arrives to the ED on CPAP.   Past Medical History:  Diagnosis Date  . Anemia   . Anxiety   . Breast cancer ([place]) 01/2016   bilateral  . CHF (congestive heart failure) ([place])   . Chronic kidney disease   . Depression   . Dialysis patient ([place])   . DVT (deep venous thrombosis) (HCC)    left leg  . DVT (deep venous thrombosis) ([place]) 1985   right thigh  . Dysrhythmia   . Gout   . Headache   . HTN (hypertension)   . Hypertension   . Parathyroid abnormality ([place])   . Parathyroid disease ([place])   . Pneumonia 12/2015  . Psoriasis   . Renal insufficiency   . Sickle cell trait ([place])    traits    Patient Active Problem List   Diagnosis Date Noted  . NICM (nonischemic cardiomyopathy) ([place]) 09/12/2016  . Elevated troponin 09/12/2016  . PNA (pneumonia) 09/12/2016  . Accelerated hypertension 09/12/2016  . Pressure injury of skin 09/11/2016  . HTN (hypertension) 02/29/2016  . Moderate recurrent major depression ([place]) 02/29/2016  . Panic attacks 02/29/2016  . Scalp lesion   . Breast cancer ([place]) 01/23/2016  . ESRD (end stage renal disease) ([place]) 01/22/2016  . Severe  anemia 01/01/2016  . Seizure ([place]) 12/31/2015  . Healthcare-associated pneumonia   . Bloodstream infection due to Port-A-Cath, initial encounter   . Sepsis ([place]) 12/15/2015  . Chest pain 08/31/2015  . SIRS (systemic inflammatory response syndrome) ([place]) 08/27/2015  . Malignant neoplasm of upper-inner quadrant of left breast in female, estrogen receptor positive ([place]) 08/05/2015  . Pulmonary edema 07/09/2015  . Dizziness 03/03/2015  . Seizures ([place]) 03/03/2015  . Spells 11/11/2014  . Acute respiratory failure with hypoxia ([place]) 09/19/2014  . Left ventricular dysfunction 09/06/2014  . Acute pulmonary edema ([place]) 08/30/2014  . Respiratory failure ([place]) 08/29/2014  . Respiratory difficulty 08/29/2014  . Acute on chronic systolic heart failure ([place]) 05/16/2014  . Hypertension 05/16/2014  . Renal failure 05/16/2014  . Anxiety 05/16/2014  . Sickle cell trait ([place]) 03/03/2014  . ESRD on dialysis ([place]) 11/02/2013  . Dysgeusia 01/27/2013  . Weight loss 01/27/2013  . Resting tremor 08/13/2012  . Depression 04/30/2012  . 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 Right leg DVT ([place]) 04/30/2012    Past Surgical History:  Procedure Laterality Date  . ABDOMINAL HYSTERECTOMY  1980  . APPENDECTOMY    . BREAST BIOPSY Left 10/28/2013   benign  . BREAST EXCISIONAL BIOPSY Left 2002   benign  . INSERTION OF DIALYSIS CATHETER  2014  . LIPOMA EXCISION N/A 01/23/2016   Procedure: EXCISION LIPOMA;  Surgeon: Hubbard Robinson, MD;  Location: [facility] ORS;  Service: General;  Laterality: N/A;  . MASTECTOMY W/ SENTINEL NODE BIOPSY Bilateral 01/23/2016   Procedure: bilateral MASTECTOMY WITH  bilateral SENTINEL LYMPH NODE BIOPSY possible left axillary node dissection forehead lipoma removal;  Surgeon: Hubbard Robinson, MD;  Location: [facility] ORS;  Service: General;  Laterality: Bilateral;  . PARTIAL HYSTERECTOMY    . PERIPHERAL VASCULAR CATHETERIZATION N/A 12/25/2015   Procedure: Dialysis/Perma  Catheter Insertion;  Surgeon: Algernon Huxley, MD;  Location: [place] [facility];  Service: Cardiovascular;  Laterality: N/A;  . PERIPHERAL VASCULAR CATHETERIZATION Left 01/22/2016   Procedure: Dialysis/Perma Catheter Insertion;  Surgeon: Algernon Huxley, MD;  Location: [place] [facility];  Service: Cardiovascular;  Laterality: Left;  . PERIPHERAL VASCULAR CATHETERIZATION N/A 01/26/2016   Procedure: Dialysis/Perma Catheter Insertion;  Surgeon: Katha Cabal, MD;  Location: [place] [facility];  Service: Cardiovascular;  Laterality: N/A;  . PORT-A-CATH REMOVAL N/A 12/20/2015   Procedure: REMOVAL PORT-A-CATH;  Surgeon: Hubbard Robinson, MD;  Location: [facility] ORS;  Service: General;  Laterality: N/A;  left    . PORTACATH PLACEMENT Left 08/21/2015   Procedure: INSERTION PORT-A-CATH;  Surgeon: Hubbard Robinson, MD;  Location: [facility] ORS;  Service: General;  Laterality: Left;  . REMOVAL OF A DIALYSIS CATHETER  2017    Prior to Admission medications   Medication Sig Start Date End Date Taking? Authorizing Provider  acetaminophen (TYLENOL) 325 MG tablet Take 2 tablets (650 mg total) by mouth every 6 (six) hours as needed for mild pain (or Fever >/= 101). 12/27/15   [name], MD  amoxicillin-clavulanate (AUGMENTIN) 500-125 MG tablet Take 1 tablet by mouth at bedtime. 09/16/16   [provider]  feeding supplement, ENSURE ENLIVE, (ENSURE ENLIVE) LIQD Take 237 mLs by mouth 3 (three) times daily between meals. Patient taking differently: Take 237 mLs by mouth daily.  12/27/15   [name], MD  hydrALAZINE (APRESOLINE) 50 MG tablet Take 1 tablet (50 mg total) by mouth every 8 (eight) hours. 09/14/16   Dustin Flock, MD  HYDROcodone-acetaminophen (NORCO/VICODIN) 5-325 MG tablet Take 1 tablet by mouth every 4 (four) hours as needed for moderate pain. 09/14/16   Dustin Flock, MD  isosorbide mononitrate (IMDUR) 30 MG 24 hr tablet Take 1 tablet (30 mg total) by mouth daily. 09/14/16   Dustin Flock,  MD  letrozole [facility]) 2.5 MG tablet Take 1 tablet (2.5 mg total) by mouth daily. Patient not taking: Reported on 09/19/2016 05/07/16   [name], MD  losartan (COZAAR) 50 MG tablet Take 3 tablets (150 mg total) by mouth daily. 09/14/16   Dustin Flock, MD  metoprolol succinate (TOPROL-XL) 100 MG 24 hr tablet Take 1 tablet (100 mg total) by mouth daily. Take with or immediately following a meal. 09/14/16   Dustin Flock, MD  ondansetron (ZOFRAN) 8 MG tablet Take by mouth every 8 (eight) hours as needed for nausea or vomiting.    [provider]  pantoprazole (PROTONIX) 40 MG tablet Take 1 tablet (40 mg total) by mouth daily. Patient taking differently: Take 40 mg by mouth 2 (two) times daily.  03/01/16   [name], MD  prochlorperazine (COMPAZINE) 10 MG tablet Take 10 mg by mouth every 6 (six) hours as needed for nausea or vomiting.    [provider]  sertraline (ZOLOFT) 50 MG tablet Take 3 tablets (150 mg total) by mouth daily. Patient taking differently: Take 200 mg by mouth daily.  03/01/16   [name], MD  sevelamer  carbonate (RENVELA) 800 MG tablet Take 2,400 mg by mouth 3 (three) times daily with meals. Tid with meals and with 1 snack     [provider]  traMADol (ULTRAM) 50 MG tablet Take 1 tablet (50 mg total) by mouth every 6 (six) hours as needed for moderate pain. Patient taking differently: Take 100 mg by mouth at bedtime. May also take as needed for pain. 08/23/16   [name], MD  UNABLE TO FIND Med Name: Prosthetic Bra with Inserts 04/23/16   [name], MD  warfarin (COUMADIN) 10 MG tablet Take 1 tablet (10 mg total) by mouth one time only at 6 PM. Patient not taking: Reported on 09/19/2016 09/14/16   Dustin Flock, MD  warfarin (COUMADIN) 5 MG tablet take 5 mg on friday, saturday, sunday. and 7.5 mg on monday, tuesday, wednesday, thursday 11/03/13   [provider]  zolpidem (AMBIEN) 5 MG tablet Take 1  tablet (5 mg total) by mouth at bedtime. 03/01/16   [name], MD    Allergies Gabapentin and Adhesive [tape]  Family History  Problem Relation Age of Onset  . Stroke Mother   . CVA Mother   . Hypertension Mother   . Hypertension Father   . Hypertension Sister   . Diabetes Sister   . Cancer Sister 66       Breast  . Stroke Brother   . Hypertension Brother   . Breast cancer Maternal Aunt 70    Social History Social History   Tobacco Use  . Smoking status: Never Smoker  . Smokeless tobacco: Never Used  Substance Use Topics  . Alcohol use: No    Alcohol/week: 0.0 oz  . Drug use: No    Review of Systems   Constitutional: No fever/chills. Eyes: No visual changes. ENT: No sore throat. Cardiovascular: Denies chest pain. Respiratory: Positive for cough and shortness of breath. Gastrointestinal: No abdominal pain.  No nausea, no vomiting.  No diarrhea.  No constipation. Genitourinary: Negative for dysuria. Musculoskeletal: Negative for back pain. Skin: Negative for rash. Neurological: Negative for headaches, focal weakness or numbness.   ____________________________________________   PHYSICAL EXAM:  VITAL SIGNS: ED Triage Vitals  Enc Vitals Group     BP      Pulse      Resp      Temp      Temp src      SpO2      Weight      Height      Head Circumference      Peak Flow      Pain Score      Pain Loc      Pain Edu?      Excl. in [place]?     Constitutional: Alert and oriented.  Ill appearing and in moderate acute distress. Eyes: Conjunctivae are normal. PERRL. EOMI. Head: Atraumatic. Nose: No congestion/rhinnorhea. Mouth/Throat: Mucous membranes are moist.  Oropharynx non-erythematous. Neck: No stridor.   Cardiovascular: Normal rate, regular rhythm. Grossly normal heart sounds.  Good peripheral circulation. Respiratory: Increased respiratory effort.  No retractions. Lungs with diffuse rales.  Left chest dialysis port. Gastrointestinal: Soft  and nontender. No distention. No abdominal bruits. No CVA tenderness. Musculoskeletal: No lower extremity tenderness nor edema.  No joint effusions. Neurologic:  Normal speech and language. No gross focal neurologic deficits are appreciated.  Skin:  Skin is warm, dry and intact. No rash noted. Psychiatric: Mood and affect are normal. Speech and behavior are normal.  ____________________________________________   LABS (all labs ordered are listed, but only abnormal results are displayed)  Labs Reviewed  CBC WITH DIFFERENTIAL/PLATELET - Abnormal; Notable for the following components:      Result Value   WBC 11.7 (*)    RDW 17.7 (*)    Neutro Abs 7.8 (*)    All other components within normal limits  PROTIME-INR - Abnormal; Notable for the following components:   Prothrombin Time 16.2 (*)    All other components within normal limits  BLOOD GAS, VENOUS - Abnormal; Notable for the following components:   pH, Ven 7.47 (*)    pCO2, Ven 40 (*)    Bicarbonate 29.1 (*)    Acid-Base Excess 5.0 (*)    All other components within normal limits  COMPREHENSIVE METABOLIC PANEL  TROPONIN I   ____________________________________________  EKG  ED ECG REPORT I, Jawana Reagor J, the attending physician, personally viewed and interpreted this ECG.   Date: 01/13/2017  EKG Time: 2343  Rate: 99  Rhythm: normal EKG, normal sinus rhythm  Axis: Normal  Intervals:none  ST&T Change: Nonspecific  ____________________________________________  RADIOLOGY  Dg Chest Port 1 View  Result Date: 01/13/2017 CLINICAL DATA:  Acute respiratory distress. EXAM: PORTABLE CHEST 1 VIEW COMPARISON:  Radiograph 09/09/2016, 08/21/2016 FINDINGS: Left-sided dialysis catheter in place. Patchy right perihilar opacities, not as prominent than on prior exam. Slight decrease in cardiomegaly. Unchanged mediastinal contours with aortic atherosclerosis. Suspect small right pleural effusion. No pneumothorax. IMPRESSION: Patchy right  perihilar opacities are improved from prior exam. Similar findings also seen on 08/21/2016. While findings may be due to asymmetric pulmonary edema, multifocal pneumonia, or aspiration, chronic lung opacities are also considered given the persistence since 08/21/2016. Consider chest CT characterization. Electronically Signed   By: Jeb Levering M.D.   On: 01/13/2017 00:53    ____________________________________________   PROCEDURES  Procedure(s) performed: None  Procedures  Critical Care performed: Yes, see critical care note(s)   CRITICAL CARE Performed by: Paulette Blanch   Total critical care time: 45 minutes  Critical care time was exclusive of separately billable procedures and treating other patients.  Critical care was necessary to treat or prevent imminent or life-threatening deterioration.  Critical care was time spent personally by me on the following activities: development of treatment plan with patient and/or surrogate as well as nursing, discussions with consultants, evaluation of patient's response to treatment, examination of patient, obtaining history from patient or surrogate, ordering and performing treatments and interventions, ordering and review of laboratory studies, ordering and review of radiographic studies, pulse oximetry and re-evaluation of patient's condition.  ____________________________________________   INITIAL IMPRESSION / ASSESSMENT AND PLAN / ED COURSE  As part of my medical decision making, I reviewed the following data within the [place] notes reviewed and incorporated, Labs reviewed, EKG interpreted, Old chart reviewed, Radiograph reviewed, Discussed with admitting physician (Dr. Duane Boston) and Notes from prior ED visits.   62 year old female on dialysis who presents with flash pulmonary edema. Differential includes, but is not limited to, viral syndrome, bronchitis including COPD exacerbation, pneumonia, reactive  airway disease including asthma, CHF including exacerbation with or without pulmonary/interstitial edema, pneumothorax, ACS, thoracic trauma, and pulmonary embolism.  Patient changed to BiPAP immediately upon her arrival to the treatment room.  Will administer IV Lasix to begin diuresis as patient still urinates.  Obtain screening lab work, chest x-ray.  Anticipate hospitalization.  Clinical Course as of Jan 14 55  Mon Jan 13, 2017  0001 Patient  refused ABG.  VBG obtained instead.  [JS]  [id number] Patient appears more comfortable on BiPAP.  Will discuss with hospitalist to evaluate patient in the emergency department for admission.  [JS]    Clinical Course User Index [JS] Paulette Blanch, MD     ____________________________________________   FINAL CLINICAL IMPRESSION(S) / ED DIAGNOSES  Final diagnoses:  Acute pulmonary edema ([place])  Flash pulmonary edema (HCC)  Acute respiratory failure, unspecified whether with hypoxia or hypercapnia (HCC)  Respiratory distress  Acute renal failure superimposed on chronic kidney disease, on chronic dialysis, unspecified acute renal failure type [facility])     ED Discharge Orders    None       Note:  This document was prepared using Dragon voice recognition software and may include unintentional dictation errors.    Paulette Blanch, MD 01/13/17 [phone]

## 2017-01-12 NOTE — ED Triage Notes (Addendum)
Pt brought in from home for respiratory distress. Pt was placed on CPAP PTA. Pt is tolerating well and 94% currently on mask. Pt is using accessory muscles for breathing. Pt has hx of kidney failure and is a M, W, F dialysis pt. Pt is oliguric. Afebrile

## 2017-01-12 NOTE — ED Notes (Signed)
Pt refusing at this time for respiratory to stick her for blood gas. Respiratory was not able to obtain sample from first stick and pt is unwilling to have a second one at this time.

## 2017-01-13 ENCOUNTER — Other Ambulatory Visit: Payer: Self-pay

## 2017-01-13 DIAGNOSIS — I132 Hypertensive heart and chronic kidney disease with heart failure and with stage 5 chronic kidney disease, or end stage renal disease: Secondary | ICD-10-CM | POA: Diagnosis present

## 2017-01-13 DIAGNOSIS — I509 Heart failure, unspecified: Secondary | ICD-10-CM

## 2017-01-13 DIAGNOSIS — F329 Major depressive disorder, single episode, unspecified: Secondary | ICD-10-CM | POA: Diagnosis present

## 2017-01-13 DIAGNOSIS — J9601 Acute respiratory failure with hypoxia: Secondary | ICD-10-CM | POA: Diagnosis present

## 2017-01-13 DIAGNOSIS — E43 Unspecified severe protein-calorie malnutrition: Secondary | ICD-10-CM

## 2017-01-13 DIAGNOSIS — G40909 Epilepsy, unspecified, not intractable, without status epilepticus: Secondary | ICD-10-CM | POA: Diagnosis present

## 2017-01-13 DIAGNOSIS — D631 Anemia in chronic kidney disease: Secondary | ICD-10-CM | POA: Diagnosis present

## 2017-01-13 DIAGNOSIS — M109 Gout, unspecified: Secondary | ICD-10-CM | POA: Diagnosis present

## 2017-01-13 DIAGNOSIS — D573 Sickle-cell trait: Secondary | ICD-10-CM | POA: Diagnosis present

## 2017-01-13 DIAGNOSIS — F419 Anxiety disorder, unspecified: Secondary | ICD-10-CM | POA: Diagnosis present

## 2017-01-13 DIAGNOSIS — G43909 Migraine, unspecified, not intractable, without status migrainosus: Secondary | ICD-10-CM | POA: Diagnosis present

## 2017-01-13 DIAGNOSIS — I340 Nonrheumatic mitral (valve) insufficiency: Secondary | ICD-10-CM | POA: Diagnosis present

## 2017-01-13 DIAGNOSIS — N186 End stage renal disease: Secondary | ICD-10-CM | POA: Diagnosis present

## 2017-01-13 DIAGNOSIS — J9602 Acute respiratory failure with hypercapnia: Secondary | ICD-10-CM | POA: Diagnosis present

## 2017-01-13 DIAGNOSIS — I482 Chronic atrial fibrillation: Secondary | ICD-10-CM | POA: Diagnosis present

## 2017-01-13 DIAGNOSIS — E1151 Type 2 diabetes mellitus with diabetic peripheral angiopathy without gangrene: Secondary | ICD-10-CM | POA: Diagnosis present

## 2017-01-13 DIAGNOSIS — N2581 Secondary hyperparathyroidism of renal origin: Secondary | ICD-10-CM | POA: Diagnosis present

## 2017-01-13 DIAGNOSIS — I480 Paroxysmal atrial fibrillation: Secondary | ICD-10-CM | POA: Diagnosis present

## 2017-01-13 DIAGNOSIS — I248 Other forms of acute ischemic heart disease: Secondary | ICD-10-CM | POA: Diagnosis present

## 2017-01-13 DIAGNOSIS — I5043 Acute on chronic combined systolic (congestive) and diastolic (congestive) heart failure: Secondary | ICD-10-CM | POA: Diagnosis present

## 2017-01-13 DIAGNOSIS — Z681 Body mass index (BMI) 19 or less, adult: Secondary | ICD-10-CM | POA: Diagnosis not present

## 2017-01-13 DIAGNOSIS — E782 Mixed hyperlipidemia: Secondary | ICD-10-CM | POA: Diagnosis present

## 2017-01-13 DIAGNOSIS — I5021 Acute systolic (congestive) heart failure: Secondary | ICD-10-CM

## 2017-01-13 DIAGNOSIS — E1122 Type 2 diabetes mellitus with diabetic chronic kidney disease: Secondary | ICD-10-CM | POA: Diagnosis present

## 2017-01-13 DIAGNOSIS — I739 Peripheral vascular disease, unspecified: Secondary | ICD-10-CM | POA: Diagnosis present

## 2017-01-13 DIAGNOSIS — N179 Acute kidney failure, unspecified: Secondary | ICD-10-CM | POA: Diagnosis present

## 2017-01-13 LAB — COMPREHENSIVE METABOLIC PANEL
ALT: 27 U/L (ref 14–54)
AST: 24 U/L (ref 15–41)
Albumin: 3.3 g/dL — ABNORMAL LOW (ref 3.5–5.0)
Alkaline Phosphatase: 95 U/L (ref 38–126)
Anion gap: 12 (ref 5–15)
BILIRUBIN TOTAL: 0.4 mg/dL (ref 0.3–1.2)
BUN: 55 mg/dL — AB (ref 6–20)
CHLORIDE: 102 mmol/L (ref 101–111)
CO2: 26 mmol/L (ref 22–32)
CREATININE: 8.64 mg/dL — AB (ref 0.44–1.00)
Calcium: 7.6 mg/dL — ABNORMAL LOW (ref 8.9–10.3)
GFR calc Af Amer: 5 mL/min — ABNORMAL LOW (ref >60)
GFR, EST NON AFRICAN AMERICAN: 4 mL/min — AB (ref >60)
Glucose, Bld: 113 mg/dL — ABNORMAL HIGH (ref 65–99)
Potassium: 4 mmol/L (ref 3.5–5.1)
Sodium: 140 mmol/L (ref 135–145)
Total Protein: 6.2 g/dL — ABNORMAL LOW (ref 6.5–8.1)

## 2017-01-13 LAB — TROPONIN I
TROPONIN I: 0.05 ng/mL — AB (ref <0.03)
Troponin I: 0.06 ng/mL (ref <0.03)
Troponin I: 0.07 ng/mL (ref <0.03)
Troponin I: 0.06 ng/mL (ref <0.03)

## 2017-01-13 LAB — CBC WITH DIFFERENTIAL/PLATELET
BASOS ABS: 0.1 10*3/uL (ref 0–0.1)
Basophils Relative: 1 %
Eosinophils Absolute: 0.3 10*3/uL (ref 0–0.7)
Eosinophils Relative: 2 %
HEMATOCRIT: 37.3 % (ref 35.0–47.0)
Hemoglobin: 12.4 g/dL (ref 12.0–16.0)
LYMPHS PCT: 25 %
Lymphs Abs: 2.9 10*3/uL (ref 1.0–3.6)
MCH: 29.6 pg (ref 26.0–34.0)
MCHC: 33.2 g/dL (ref 32.0–36.0)
MCV: 89.1 fL (ref 80.0–100.0)
Monocytes Absolute: 0.6 10*3/uL (ref 0.2–0.9)
Monocytes Relative: 5 %
NEUTROS ABS: 7.8 10*3/uL — AB (ref 1.4–6.5)
NEUTROS PCT: 67 %
Platelets: 268 10*3/uL (ref 150–440)
RBC: 4.19 MIL/uL (ref 3.80–5.20)
RDW: 17.7 % — ABNORMAL HIGH (ref 11.5–14.5)
WBC: 11.7 10*3/uL — AB (ref 3.6–11.0)

## 2017-01-13 LAB — RENAL FUNCTION PANEL
ALBUMIN: 3.2 g/dL — AB (ref 3.5–5.0)
ANION GAP: 15 (ref 5–15)
BUN: 63 mg/dL — AB (ref 6–20)
CALCIUM: 8.5 mg/dL — AB (ref 8.9–10.3)
CO2: 25 mmol/L (ref 22–32)
Chloride: 98 mmol/L — ABNORMAL LOW (ref 101–111)
Creatinine, Ser: 9.86 mg/dL — ABNORMAL HIGH (ref 0.44–1.00)
GFR calc Af Amer: 4 mL/min — ABNORMAL LOW (ref >60)
GFR, EST NON AFRICAN AMERICAN: 4 mL/min — AB (ref >60)
Glucose, Bld: 93 mg/dL (ref 65–99)
PHOSPHORUS: 5.6 mg/dL — AB (ref 2.5–4.6)
POTASSIUM: 4.7 mmol/L (ref 3.5–5.1)
SODIUM: 138 mmol/L (ref 135–145)

## 2017-01-13 LAB — CBC
HEMATOCRIT: 34.5 % — AB (ref 35.0–47.0)
HEMATOCRIT: 32.9 % — AB (ref 35.0–47.0)
HEMOGLOBIN: 11.8 g/dL — AB (ref 12.0–16.0)
HEMOGLOBIN: 10.9 g/dL — AB (ref 12.0–16.0)
MCH: 30.2 pg (ref 26.0–34.0)
MCH: 29.2 pg (ref 26.0–34.0)
MCHC: 34.1 g/dL (ref 32.0–36.0)
MCHC: 33.1 g/dL (ref 32.0–36.0)
MCV: 88.7 fL (ref 80.0–100.0)
MCV: 88.1 fL (ref 80.0–100.0)
Platelets: 209 10*3/uL (ref 150–440)
Platelets: 208 10*3/uL (ref 150–440)
RBC: 3.89 MIL/uL (ref 3.80–5.20)
RBC: 3.74 MIL/uL — ABNORMAL LOW (ref 3.80–5.20)
RDW: 17.1 % — ABNORMAL HIGH (ref 11.5–14.5)
RDW: 17.3 % — ABNORMAL HIGH (ref 11.5–14.5)
WBC: 10.4 10*3/uL (ref 3.6–11.0)
WBC: 8.2 10*3/uL (ref 3.6–11.0)

## 2017-01-13 LAB — BLOOD GAS, VENOUS
ACID-BASE EXCESS: 5 mmol/L — AB (ref 0.0–2.0)
BICARBONATE: 29.1 mmol/L — AB (ref 20.0–28.0)
O2 Saturation: 75 %
PCO2 VEN: 40 mmHg — AB (ref 44.0–60.0)
PH VEN: 7.47 — AB (ref 7.250–7.430)
Patient temperature: 37
pO2, Ven: 37 mmHg (ref 32.0–45.0)

## 2017-01-13 LAB — MRSA PCR SCREENING: MRSA BY PCR: NEGATIVE

## 2017-01-13 LAB — GLUCOSE, CAPILLARY: Glucose-Capillary: 100 mg/dL — ABNORMAL HIGH (ref 65–99)

## 2017-01-13 LAB — PROTIME-INR
INR: 1.31
Prothrombin Time: 16.2 seconds — ABNORMAL HIGH (ref 11.4–15.2)

## 2017-01-13 MED ORDER — PANTOPRAZOLE SODIUM 40 MG PO TBEC
40.0000 mg | DELAYED_RELEASE_TABLET | Freq: Every day | ORAL | Status: DC
  Administered 2017-01-13 – 2017-01-14 (×2): 40 mg via ORAL
  Filled 2017-01-13 (×2): qty 1

## 2017-01-13 MED ORDER — SERTRALINE HCL 50 MG PO TABS
150.0000 mg | ORAL_TABLET | Freq: Every day | ORAL | Status: DC
  Administered 2017-01-13 – 2017-01-14 (×2): 150 mg via ORAL
  Filled 2017-01-13 (×2): qty 3

## 2017-01-13 MED ORDER — HYDROCODONE-ACETAMINOPHEN 5-325 MG PO TABS
1.0000 | ORAL_TABLET | ORAL | Status: DC | PRN
  Administered 2017-01-13 (×3): 1 via ORAL
  Filled 2017-01-13 (×3): qty 1

## 2017-01-13 MED ORDER — RENA-VITE PO TABS
1.0000 | ORAL_TABLET | Freq: Every day | ORAL | Status: DC
  Administered 2017-01-13: 1 via ORAL
  Filled 2017-01-13: qty 1

## 2017-01-13 MED ORDER — SODIUM CHLORIDE 0.9% FLUSH
3.0000 mL | Freq: Two times a day (BID) | INTRAVENOUS | Status: DC
  Administered 2017-01-13 – 2017-01-14 (×4): 3 mL via INTRAVENOUS

## 2017-01-13 MED ORDER — IBUPROFEN 400 MG PO TABS
600.0000 mg | ORAL_TABLET | Freq: Once | ORAL | Status: AC
  Administered 2017-01-13: 600 mg via ORAL
  Filled 2017-01-13: qty 2

## 2017-01-13 MED ORDER — ZOLPIDEM TARTRATE 5 MG PO TABS
5.0000 mg | ORAL_TABLET | Freq: Every day | ORAL | Status: DC
  Administered 2017-01-13: 5 mg via ORAL
  Filled 2017-01-13: qty 1

## 2017-01-13 MED ORDER — FUROSEMIDE 10 MG/ML IJ SOLN
40.0000 mg | Freq: Two times a day (BID) | INTRAMUSCULAR | Status: DC
  Administered 2017-01-13: 40 mg via INTRAVENOUS
  Filled 2017-01-13 (×2): qty 4

## 2017-01-13 MED ORDER — METOPROLOL SUCCINATE ER 100 MG PO TB24
100.0000 mg | ORAL_TABLET | Freq: Every day | ORAL | Status: DC
  Administered 2017-01-13: 100 mg via ORAL
  Filled 2017-01-13: qty 1
  Filled 2017-01-13: qty 2

## 2017-01-13 MED ORDER — HYDRALAZINE HCL 50 MG PO TABS
50.0000 mg | ORAL_TABLET | Freq: Three times a day (TID) | ORAL | Status: DC
  Administered 2017-01-13 (×3): 50 mg via ORAL
  Filled 2017-01-13 (×5): qty 1

## 2017-01-13 MED ORDER — ONDANSETRON HCL 4 MG/2ML IJ SOLN
4.0000 mg | Freq: Four times a day (QID) | INTRAMUSCULAR | Status: DC | PRN
  Administered 2017-01-13: 4 mg via INTRAVENOUS
  Filled 2017-01-13: qty 2

## 2017-01-13 MED ORDER — ACETAMINOPHEN 325 MG PO TABS: 650.0000 mg | ORAL_TABLET | ORAL | Status: DC | PRN

## 2017-01-13 MED ORDER — ENSURE ENLIVE PO LIQD
237.0000 mL | Freq: Three times a day (TID) | ORAL | Status: DC
  Administered 2017-01-13: 237 mL via ORAL

## 2017-01-13 MED ORDER — LOSARTAN POTASSIUM 50 MG PO TABS
150.0000 mg | ORAL_TABLET | Freq: Every day | ORAL | Status: DC
  Administered 2017-01-13: 150 mg via ORAL
  Filled 2017-01-13 (×2): qty 3

## 2017-01-13 MED ORDER — POTASSIUM CHLORIDE 10 MEQ/100ML IV SOLN
10.0000 meq | INTRAVENOUS | Status: DC
  Filled 2017-01-13 (×3): qty 100

## 2017-01-13 MED ORDER — ENSURE ENLIVE PO LIQD
237.0000 mL | Freq: Three times a day (TID) | ORAL | Status: DC
  Administered 2017-01-13 – 2017-01-14 (×2): 237 mL via ORAL

## 2017-01-13 MED ORDER — SODIUM CHLORIDE 0.9 % IV SOLN: 250.0000 mL | INTRAVENOUS | Status: DC | PRN

## 2017-01-13 MED ORDER — WARFARIN SODIUM 5 MG PO TABS
5.0000 mg | ORAL_TABLET | Freq: Once | ORAL | Status: AC
  Administered 2017-01-13: 5 mg via ORAL
  Filled 2017-01-13: qty 1

## 2017-01-13 MED ORDER — ISOSORBIDE MONONITRATE ER 30 MG PO TB24
30.0000 mg | ORAL_TABLET | Freq: Every day | ORAL | Status: DC
  Administered 2017-01-13: 30 mg via ORAL
  Filled 2017-01-13 (×2): qty 1

## 2017-01-13 MED ORDER — SEVELAMER CARBONATE 800 MG PO TABS
2400.0000 mg | ORAL_TABLET | Freq: Three times a day (TID) | ORAL | Status: DC
  Administered 2017-01-13 – 2017-01-14 (×5): 2400 mg via ORAL
  Filled 2017-01-13 (×5): qty 3

## 2017-01-13 MED ORDER — SODIUM CHLORIDE 0.9% FLUSH: 3.0000 mL | INTRAVENOUS | Status: DC | PRN

## 2017-01-13 MED ORDER — ASPIRIN EC 81 MG PO TBEC
81.0000 mg | DELAYED_RELEASE_TABLET | Freq: Every day | ORAL | Status: DC
  Administered 2017-01-13: 81 mg via ORAL
  Filled 2017-01-13 (×2): qty 1

## 2017-01-13 MED ORDER — HEPARIN SODIUM (PORCINE) 5000 UNIT/ML IJ SOLN
5000.0000 [IU] | Freq: Three times a day (TID) | INTRAMUSCULAR | Status: DC
  Administered 2017-01-13 (×3): 5000 [IU] via SUBCUTANEOUS
  Filled 2017-01-13 (×4): qty 1

## 2017-01-13 MED ORDER — HYDRALAZINE HCL 20 MG/ML IJ SOLN
10.0000 mg | INTRAMUSCULAR | Status: DC | PRN
  Filled 2017-01-13: qty 1

## 2017-01-13 NOTE — ED Notes (Signed)
Dr. Beather Arbour notified of Troponin 0.05

## 2017-01-13 NOTE — Care Management Note (Signed)
Case Management Note  Patient Details  Name: Teresa Cook MRN: [id number] Date of Birth: 10/21/1955  Subjective/Objective:                  RNCM consult received for assistance with home health for CHF. Patient is a hemodialysis patient however she drives herself to the clinic.  She states she lives alone. She walks independently. Her PCP is Dr. Clide Deutscher of [facility] along with Nephrologist.  She is interested in someone to do house keeping but doesn't want to pay for it.  She denies problems paying for her medications.  She has presented to hospital 3 times in last 6 months. Per Sept. 2018 visit, PT evaluation did not recommend ambulatory aide or home health services. She is not on supplemental O2 at home.   Action/Plan: Home health list provided that also included private duty agencies.   Expected Discharge Date:  01/15/17               Expected Discharge Plan:     In-House Referral:     Discharge planning Services  CM Consult  Post Acute Care Choice:    Choice offered to:  Patient  DME Arranged:    DME Agency:     HH Arranged:    [place] Agency:     Status of Service:  Completed, signed off  If discussed at [employer] of Stay Meetings, dates discussed:    Additional Comments:  Marshell Garfinkel, RN 01/13/2017, 2:31 PM

## 2017-01-13 NOTE — ED Notes (Addendum)
Nurse confirmed with lab tech that orders were still in place for troponin and CMP. Dr. Beather Arbour allowed pt to take home medication of Trazadone 50mg .

## 2017-01-13 NOTE — Progress Notes (Signed)
Patient transferred to room 208, CCMD notified of transfer. Teresa Cook

## 2017-01-13 NOTE — Progress Notes (Signed)
PRE HD   

## 2017-01-13 NOTE — H&P (Signed)
[facility] NAME: Teresa Cook    MR#:  [id number]  DATE OF BIRTH:  08-09-1955  DATE OF ADMISSION:  01/12/2017  PRIMARY CARE PHYSICIAN: Murlean Iba, MD   REQUESTING/REFERRING PHYSICIAN:   CHIEF COMPLAINT:   Chief Complaint  Patient presents with  . Respiratory Distress    HISTORY OF PRESENT ILLNESS: Teresa Cook  is a 62 y.o. female with a known history of end-stage renal disease on dialysis, DVT, atrial fibrillation on Coumadin for anticoagulation, gout, hypertension, parathyroid disease presented to the emergency room with shortness of breath.  Patient became acutely short of breath yesterday and her oxygen level was low.  She was put on BiPAP and stabilized in the emergency room.  She was found to be in fluid overload and pulmonary edema.  Patient has history of systolic and diastolic heart failure.  She was given Lasix for diuresis.  Patient gets dialyzed on Monday Wednesday and Friday.  Her INR is subtherapeutic.  No complaints of any chest pain.  Her shortness of breath has improved after she was put on BiPAP in the emergency room.  No fever, chills.  Has a very occasional cough.  Hospitalist service was consulted for further care.  PAST MEDICAL HISTORY:   Past Medical History:  Diagnosis Date  . Anemia   . Anxiety   . Breast cancer ([place]) 01/2016   bilateral  . CHF (congestive heart failure) ([place])   . Chronic kidney disease   . Depression   . Dialysis patient ([place])   . DVT (deep venous thrombosis) ([facility])    left leg  . DVT (deep venous thrombosis) ([place]) 1985   right thigh  . Dysrhythmia   . Gout   . Headache   . HTN (hypertension)   . Hypertension   . Parathyroid abnormality ([place])   . Parathyroid disease ([place])   . Pneumonia 12/2015  . Psoriasis   . Renal insufficiency   . Sickle cell trait ([place])    traits    PAST SURGICAL HISTORY:  Past Surgical History:  Procedure Laterality Date  . ABDOMINAL  HYSTERECTOMY  1980  . APPENDECTOMY    . BREAST BIOPSY Left 10/28/2013   benign  . BREAST EXCISIONAL BIOPSY Left 2002   benign  . INSERTION OF DIALYSIS CATHETER  2014  . LIPOMA EXCISION N/A 01/23/2016   Procedure: EXCISION LIPOMA;  Surgeon: Hubbard Robinson, MD;  Location: [facility];  Service: General;  Laterality: N/A;  . MASTECTOMY W/ SENTINEL NODE BIOPSY Bilateral 01/23/2016   Procedure: bilateral MASTECTOMY WITH  bilateral SENTINEL LYMPH NODE BIOPSY possible left axillary node dissection forehead lipoma removal;  Surgeon: Hubbard Robinson, MD;  Location: [facility];  Service: General;  Laterality: Bilateral;  . PARTIAL HYSTERECTOMY    . PERIPHERAL VASCULAR CATHETERIZATION N/A 12/25/2015   Procedure: Dialysis/Perma Catheter Insertion;  Surgeon: Algernon Huxley, MD;  Location: [facility];  Service: Cardiovascular;  Laterality: N/A;  . PERIPHERAL VASCULAR CATHETERIZATION Left 01/22/2016   Procedure: Dialysis/Perma Catheter Insertion;  Surgeon: Algernon Huxley, MD;  Location: [facility];  Service: Cardiovascular;  Laterality: Left;  . PERIPHERAL VASCULAR CATHETERIZATION N/A 01/26/2016   Procedure: Dialysis/Perma Catheter Insertion;  Surgeon: Katha Cabal, MD;  Location: [facility];  Service: Cardiovascular;  Laterality: N/A;  . PORT-A-CATH REMOVAL N/A 12/20/2015   Procedure: REMOVAL PORT-A-CATH;  Surgeon: Hubbard Robinson, MD;  Location: [facility];  Service: General;  Laterality: N/A;  left    . PORTACATH PLACEMENT Left 08/21/2015   Procedure: INSERTION PORT-A-CATH;  Surgeon: Hubbard Robinson, MD;  Location: [facility];  Service: General;  Laterality: Left;  . REMOVAL OF A DIALYSIS CATHETER  2017    SOCIAL HISTORY:  Social History   Tobacco Use  . Smoking status: Never Smoker  . Smokeless tobacco: Never Used  Substance Use Topics  . Alcohol use: No    Alcohol/week: 0.0 oz    FAMILY HISTORY:  Family History  Problem Relation Age of Onset  . Stroke Mother    . CVA Mother   . Hypertension Mother   . Hypertension Father   . Hypertension Sister   . Diabetes Sister   . Cancer Sister 67       Breast  . Stroke Brother   . Hypertension Brother   . Breast cancer Maternal Aunt 70    DRUG ALLERGIES:  Allergies  Allergen Reactions  . Gabapentin Other (See Comments)    Seizure  . Adhesive [Tape] Itching    Silk tape is ok to use.    REVIEW OF SYSTEMS:   CONSTITUTIONAL: No fever, has weakness.  EYES: No blurred or double vision.  EARS, NOSE, AND THROAT: No tinnitus or ear pain.  RESPIRATORY: Has occasional cough, has shortness of breath,  No wheezing or hemoptysis.  CARDIOVASCULAR: No chest pain,  has orthopnea, edema.  GASTROINTESTINAL: No nausea, vomiting, diarrhea or abdominal pain.  GENITOURINARY: No dysuria, hematuria.  ENDOCRINE: No polyuria, nocturia,  HEMATOLOGY: No anemia, easy bruising or bleeding SKIN: No rash or lesion. MUSCULOSKELETAL: No joint pain or arthritis.   NEUROLOGIC: No tingling, numbness, weakness.  PSYCHIATRY: No anxiety or depression.   MEDICATIONS AT HOME:  Prior to Admission medications   Medication Sig Start Date End Date Taking? Authorizing Provider  acetaminophen (TYLENOL) 325 MG tablet Take 2 tablets (650 mg total) by mouth every 6 (six) hours as needed for mild pain (or Fever >/= 101). 12/27/15  Yes [name], [name], MD  b complex-vitamin c-folic acid (NEPHRO-VITE) 0.8 MG TABS tablet Take 1 tablet by mouth daily.   Yes [provider]  cinacalcet (SENSIPAR) 60 MG tablet Take 60 mg by mouth daily.   Yes [provider]  hydrALAZINE (APRESOLINE) 50 MG tablet Take 1 tablet (50 mg total) by mouth every 8 (eight) hours. 09/14/16  Yes Dustin Flock, MD  HYDROcodone-acetaminophen (NORCO/VICODIN) 5-325 MG tablet Take 1 tablet by mouth every 4 (four) hours as needed for moderate pain. 09/14/16  Yes Dustin Flock, MD  isosorbide mononitrate (IMDUR) 30 MG 24 hr tablet Take 1 tablet (30 mg total) by  mouth daily. 09/14/16  Yes Dustin Flock, MD  losartan (COZAAR) 50 MG tablet Take 3 tablets (150 mg total) by mouth daily. 09/14/16  Yes Dustin Flock, MD  metoprolol succinate (TOPROL-XL) 100 MG 24 hr tablet Take 1 tablet (100 mg total) by mouth daily. Take with or immediately following a meal. 09/14/16  Yes Dustin Flock, MD  pantoprazole (PROTONIX) 40 MG tablet Take 1 tablet (40 mg total) by mouth daily. 03/01/16  Yes [name], MD  sertraline (ZOLOFT) 50 MG tablet Take 3 tablets (150 mg total) by mouth daily. Patient taking differently: Take 200 mg by mouth daily.  03/01/16  Yes [name], MD  sevelamer carbonate (RENVELA) 800 MG tablet Take 2,400 mg by mouth 3 (three) times daily with meals. Tid with meals and 1 tablet with snacks once a day   Yes [provider]  traZODone (  DESYREL) 50 MG tablet Take 50 mg by mouth at bedtime. May increase to 2 tablets if needed.   Yes [provider]  warfarin (COUMADIN) 5 MG tablet take 5 mg on friday, saturday, sunday. and 7.5 mg on monday, tuesday, wednesday, thursday 11/03/13  Yes [provider]  zolpidem (AMBIEN) 5 MG tablet Take 1 tablet (5 mg total) by mouth at bedtime. Patient taking differently: Take 10 mg by mouth at bedtime.  03/01/16  Yes [name], MD  amoxicillin-clavulanate (AUGMENTIN) 500-125 MG tablet Take 1 tablet by mouth at bedtime. 09/16/16   [provider]  feeding supplement, ENSURE ENLIVE, (ENSURE ENLIVE) LIQD Take 237 mLs by mouth 3 (three) times daily between meals. Patient taking differently: Take 237 mLs by mouth daily.  12/27/15   [name], MD  letrozole (FEMARA) 2.5 MG tablet Take 1 tablet (2.5 mg total) by mouth daily. Patient not taking: Reported on 09/19/2016 05/07/16   [name], MD  ondansetron (ZOFRAN) 8 MG tablet Take by mouth every 8 (eight) hours as needed for nausea or vomiting.    [provider]  prochlorperazine (COMPAZINE)  10 MG tablet Take 10 mg by mouth every 6 (six) hours as needed for nausea or vomiting.    [provider]  traMADol (ULTRAM) 50 MG tablet Take 1 tablet (50 mg total) by mouth every 6 (six) hours as needed for moderate pain. Patient not taking: Reported on 01/13/2017 08/23/16   [name], MD  UNABLE TO FIND Med Name: Prosthetic Bra with Inserts 04/23/16   [name], MD  warfarin (COUMADIN) 10 MG tablet Take 1 tablet (10 mg total) by mouth one time only at 6 PM. Patient not taking: Reported on 09/19/2016 09/14/16   Dustin Flock, MD      PHYSICAL EXAMINATION:   VITAL SIGNS: Blood pressure (!) 152/111, pulse 84, temperature (!) 96.2 F (35.7 C), temperature source Axillary, resp. rate 14, SpO2 100 %.  GENERAL:  62 y.o.-year-old patient lying in the bed in mild to moderate respiratory distress.  EYES: Pupils equal, round, reactive to light and accommodation. No scleral icterus. Extraocular muscles intact.  HEENT: Head atraumatic, normocephalic. Oropharynx and nasopharynx clear.  NECK:  Supple, no jugular venous distention. No thyroid enlargement, no tenderness.  LUNGS: Decreased breath sounds bilaterally, bibasilar crepitations heard. No use of accessory muscles of respiration.  CARDIOVASCULAR: S1, S2 normal. No murmurs, rubs, or gallops.  ABDOMEN: Soft, nontender, nondistended. Bowel sounds present. No organomegaly or mass.  EXTREMITIES: No pedal edema, cyanosis, or clubbing.  NEUROLOGIC: Cranial nerves II through XII are intact. Muscle strength 5/5 in all extremities. Sensation intact. Gait not checked.  PSYCHIATRIC: The patient is alert and oriented x 3.  SKIN: No obvious rash, lesion, or ulcer.   LABORATORY PANEL:   CBC Recent Labs  Lab 01/12/17 2343  WBC 11.7*  HGB 12.4  HCT 37.3  PLT 268  MCV 89.1  MCH 29.6  MCHC 33.2  RDW 17.7*  LYMPHSABS 2.9  MONOABS 0.6  EOSABS 0.3  BASOSABS 0.1    ------------------------------------------------------------------------------------------------------------------  Chemistries  Recent Labs  Lab 01/13/17 0049  NA 140  K 4.0  CL 102  CO2 26  GLUCOSE 113*  BUN 55*  CREATININE 8.64*  CALCIUM 7.6*  AST 24  ALT 27  ALKPHOS 95  BILITOT 0.4   ------------------------------------------------------------------------------------------------------------------ CrCl cannot be calculated (Unknown ideal weight.). ------------------------------------------------------------------------------------------------------------------ No results for input(s): TSH, T4TOTAL, T3FREE, THYROIDAB in the last 72 hours.  Invalid input(s): FREET3  Coagulation profile Recent Labs  Lab 01/12/17 2343  INR 1.31   ------------------------------------------------------------------------------------------------------------------- No results for input(s): DDIMER in the last 72 hours. -------------------------------------------------------------------------------------------------------------------  Cardiac Enzymes Recent Labs  Lab 01/13/17 0049  TROPONINI 0.05*   ------------------------------------------------------------------------------------------------------------------ Invalid input(s): POCBNP  ---------------------------------------------------------------------------------------------------------------  Urinalysis    Component Value Date/Time   COLORURINE STRAW (A) 09/09/2016 1521   APPEARANCEUR CLEAR (A) 09/09/2016 1521   APPEARANCEUR Clear 09/08/2013 0133   LABSPEC 1.008 09/09/2016 1521   LABSPEC 1.013 09/08/2013 0133   PHURINE 8.0 09/09/2016 1521   GLUCOSEU 50 (A) 09/09/2016 1521   GLUCOSEU 50 mg/dL 09/08/2013 0133   HGBUR NEGATIVE 09/09/2016 1521   BILIRUBINUR NEGATIVE 09/09/2016 1521   BILIRUBINUR Negative 09/08/2013 0133   KETONESUR NEGATIVE 09/09/2016 1521   PROTEINUR 100 (A) 09/09/2016 1521   NITRITE NEGATIVE 09/09/2016  1521   LEUKOCYTESUR NEGATIVE 09/09/2016 1521   LEUKOCYTESUR Negative 09/08/2013 0133     RADIOLOGY: Dg Chest Port 1 View  Result Date: 01/13/2017 CLINICAL DATA:  Acute respiratory distress. EXAM: PORTABLE CHEST 1 VIEW COMPARISON:  Radiograph 09/09/2016, 08/21/2016 FINDINGS: Left-sided dialysis catheter in place. Patchy right perihilar opacities, not as prominent than on prior exam. Slight decrease in cardiomegaly. Unchanged mediastinal contours with aortic atherosclerosis. Suspect small right pleural effusion. No pneumothorax. IMPRESSION: Patchy right perihilar opacities are improved from prior exam. Similar findings also seen on 08/21/2016. While findings may be due to asymmetric pulmonary edema, multifocal pneumonia, or aspiration, chronic lung opacities are also considered given the persistence since 08/21/2016. Consider chest CT characterization. Electronically Signed   By: Jeb Levering M.D.   On: 01/13/2017 00:53    EKG: Orders placed or performed during the hospital encounter of 01/12/17  . ED EKG  . ED EKG  . EKG 12-Lead  . EKG 12-Lead    IMPRESSION AND PLAN: 62 year old female patient with history of systolic and diastolic heart failure, end-stage renal disease on dialysis, gout, hypertension, DVT, atrial fibrillation on Coumadin presented to the emergency room with increased shortness of breath.  Admitting diagnosis 1.  Acute respiratory distress 2.  Acute on chronic heart failure exacerbation 3.  Fluid overload 4.  End-stage renal disease on dialysis 5.  Hypertension 6.  Chronic atrial fibrillation Treatment plan Admit patient to stepdown unit Continue BiPAP for respiratory distress IV Lasix for diuresis Nephrology consultation for dialysis Cardiology consultation Cycle troponin Resume Coumadin for anticoagulation and follow-up INR Check echocardiogram  All the records are reviewed and case discussed with ED provider. Management plans discussed with the patient,  family and they are in agreement.  CODE STATUS:FULL CODE Code Status History    Date Active Date Inactive Code Status Order ID Comments User Context   09/09/2016 08:05 09/14/2016 19:30 Full Code [id number]  Saundra Shelling, MD Inpatient   08/21/2016 11:02 08/23/2016 20:29 Full Code [id number]  [name], MD Inpatient   02/29/2016 17:22 03/01/2016 20:55 DNR [id number]  Bettey Costa, MD Inpatient   02/29/2016 17:04 02/29/2016 17:22 Full Code [id number]  Bettey Costa, MD Inpatient   01/23/2016 18:33 01/29/2016 21:45 Full Code [id number]  Hubbard Robinson, MD Inpatient   01/22/2016 15:43 01/23/2016 18:33 Full Code [id number]  Dustin Flock, MD Inpatient   12/31/2015 21:39 01/03/2016 21:31 Full Code [id number]  [name], MD Inpatient   12/15/2015 22:40 12/28/2015 18:52 Full Code [id number]  Harvie Bridge, DO Inpatient   08/31/2015 04:26 09/02/2015 14:35 Full Code [id number]  Harrie Foreman, MD Inpatient   08/27/2015 04:06 08/29/2015 15:57 Full Code [id number]  Saundra Shelling,  MD ED   07/09/2015 14:44 07/12/2015 19:55 Full Code [id number]  Laverle Hobby, MD ED   09/19/2014 11:47 09/20/2014 18:25 Full Code [id number]  Dustin Flock, MD ED   08/29/2014 14:28 08/30/2014 17:43 Full Code [id number]  Fritzi Mandes, MD Inpatient       TOTAL CRITICAL CARE TIME TAKING CARE OF THIS PATIENT: 55 minutes.    Saundra Shelling M.D on 01/13/2017 at 2:16 AM  Between 7am to 6pm - Pager - [phone]  After 6pm go to [email or web address] - password [email or web address]  Office  [phone]  CC: Primary care physician; Murlean Iba, MD

## 2017-01-13 NOTE — Progress Notes (Addendum)
Note entered on wrong patient. 

## 2017-01-13 NOTE — Progress Notes (Signed)
   CHIEF COMPLAINT:   Chief Complaint  Patient presents with  . Respiratory Distress    Subjective  62 y.o. female with a known history of end-stage renal disease on dialysis, DVT, atrial fibrillation on Coumadin for anticoagulation, gout, hypertension, parathyroid disease presented to the emergency room with shortness of breath.  Patient became acutely short of breath yesterday and her oxygen level was low.    She was put on BiPAP and stabilized in the emergency room.  She was found to be in fluid overload and pulmonary edema.   Weaned off biPAP, 4L [place] now Plan for HD today  Alert and awake Following commands   Review of Systems:  Gen:  Denies  fever, sweats, chills weigh loss   HEENT: Denies blurred vision, double vision, ear pain, eye pain, hearing loss, nose bleeds, sore throat  Cardiac:  No dizziness, chest pain or heaviness, chest tightness,edema  Resp:   Denies cough or sputum porduction, shortness of breath,wheezing, hemoptysis,   Other:  All other systems negative      Objective   Examination:  General exam: Appears calm and comfortable  Respiratory system: Clear to auscultation. Respiratory effort normal. +crackles HEENT: [place]/AT, PERRLA, no thrush, no stridor. Cardiovascular system: S1 & S2 heard, RRR. No JVD, murmurs, rubs, gallops or clicks. No pedal edema. Gastrointestinal system: Abdomen is nondistended, soft and nontender. No organomegaly or masses felt. Normal bowel sounds heard. Central nervous system: Alert and oriented. No focal neurological deficits. Extremities: Symmetric 5 x 5 power. Skin: No rashes, lesions or ulcers Psychiatry: Judgement and insight appear normal. Mood & affect appropriate.   VITALS:  height is 5\' 5"  (1.651 m) and weight is 117 lb 8.1 oz (53.3 kg). Her oral temperature is 97.7 F (36.5 C). Her blood pressure is 116/81 and her pulse is 73. Her respiration is 19 and oxygen saturation is 98%.   I personally reviewed Labs under  Results section.     Assessment/Plan:  62 yo with acute resp failure from acute pulm edema from acute CHF exacerbation EF 30%  1.wean off oxygen 2.wean off biPAP as tolerated 3.plan for HD  Transfer to gen med floor after HD   Corrin Parker, M.D.  [facility]  Medical Director [place] Director [facility] Cardio-Pulmonary Department

## 2017-01-13 NOTE — Progress Notes (Signed)
Admitted this morning for respiratory distress with hypoxia, found to have flash pulmonary edema and admitted to intensive care unit.  Initially required BiPAP but now on oxygen 2 L and getting hemodialysis.  Patient usually gets hemodialysis on Monday, Wednesday, Friday.  Last hemodialysis was on Friday, patient scheduled for hemodialysis.  Labs, medications reviewed. Vitals: Blood pressure 142/89, temperature 98.58F, heart rate 83, saturation 98% on 2 L.  Physical exam shows alert, awake, oriented. Cardiovascular system: S1, S2 regular. Lungs: Clear to auscultation, no wheeze, no rales.  Abdomen: Soft, nontender, nondistended wound. Extremities: No extremity edema. Assessment and plan: Acute respiratory failure with pulmonary edema from acute CHF exacerbation with EF 30%, patient has ESRD: 1.  Patient received hemodialysis today, wean off oxygen, potentially go off ICU to regular floor. 2.  Acute on chronic systolic heart failure with EF 30%.  Waiting for cardiology opinion. Spent 25 minutes.  Discussed with Dr. Juleen China.

## 2017-01-13 NOTE — Progress Notes (Signed)
[facility]  ROUNDING NOTE   Subjective:   [name] admitted to [facility] on 01/12/2017 for Acute pulmonary edema ([place]) [J81.0] Respiratory distress [R06.03] Flash pulmonary edema ([place]) [J81.0] Acute respiratory failure, unspecified whether with hypoxia or hypercapnia (HCC) [J96.00] Acute renal failure superimposed on chronic kidney disease, on chronic dialysis, unspecified acute renal failure type ([place]) [N17.9, N18.9, Z99.2] CHF (congestive heart failure) ([place]) [I50.9]  Placed on noninvasive ventilation. Transitioned to [place] O2.   States she has been consuming more salt in her diet recently. Last hemodialysis was Friday. Left below her dry weight at 52.3kg   Objective:  Vital signs in last 24 hours:  Temp:  [96.2 F (35.7 C)-98.1 F (36.7 C)] 98 F (36.7 C) (01/07 1006) Pulse Rate:  [70-99] 82 (01/07 1006) Resp:  [13-28] 17 (01/07 1006) BP: (106-180)/(72-138) 152/96 (01/07 1006) SpO2:  [92 %-100 %] 96 % (01/07 1006) Weight:  [53.3 kg (117 lb 8.1 oz)-55.7 kg (122 lb 12.7 oz)] 55.7 kg (122 lb 12.7 oz) (01/07 1006)  Weight change:  Filed Weights   01/13/17 0318 01/13/17 1006  Weight: 53.3 kg (117 lb 8.1 oz) 55.7 kg (122 lb 12.7 oz)    Intake/Output: I/O last 3 completed shifts: In: -  Out: 50 [Urine:50]   Intake/Output this shift:  No intake/output data recorded.  Physical Exam: General: Laying in bed, ill appearing  Head: [place] O2 4 liters  Eyes: Anicteric, PERRL  Neck: Supple, trachea midline  Lungs:  Bibasilar crackles  Heart: Regular rate and rhythm  Abdomen:  Soft, nontender  Extremities: no peripheral edema.  Neurologic: Nonfocal, moving all four extremities  Skin: No lesions  Access: LIJ permcath    Basic Metabolic Panel: Recent Labs  Lab 01/13/17 0049  NA 140  K 4.0  CL 102  CO2 26  GLUCOSE 113*  BUN 55*  CREATININE 8.64*  CALCIUM 7.6*    Liver Function Tests: Recent Labs  Lab 01/13/17 0049  AST 24  ALT 27  ALKPHOS 95   BILITOT 0.4  PROT 6.2*  ALBUMIN 3.3*   No results for input(s): LIPASE, AMYLASE in the last 168 hours. No results for input(s): AMMONIA in the last 168 hours.  CBC: Recent Labs  Lab 01/12/17 2343 01/13/17 0353  WBC 11.7* 10.4  NEUTROABS 7.8*  --   HGB 12.4 11.8*  HCT 37.3 34.5*  MCV 89.1 88.7  PLT 268 209    Cardiac Enzymes: Recent Labs  Lab 01/13/17 0049 01/13/17 0353 01/13/17 0909  TROPONINI 0.05* 0.06* 0.07*    BNP: Invalid input(s): POCBNP  CBG: Recent Labs  Lab 01/13/17 0316  GLUCAP 100*    Microbiology: Results for orders placed or performed during the Cook encounter of 01/12/17  MRSA PCR Screening     Status: None   Collection Time: 01/13/17  3:48 AM  Result Value Ref Range Status   MRSA by PCR NEGATIVE NEGATIVE Final    Comment:        The GeneXpert MRSA Assay (FDA approved for NASAL specimens only), is one component of a comprehensive MRSA colonization surveillance program. It is not intended to diagnose MRSA infection nor to guide or monitor treatment for MRSA infections. Performed at [facility], [address] [place], [place] [address]     Coagulation Studies: Recent Labs    01/12/17 2343  LABPROT 16.2*  INR 1.31    Urinalysis: No results for input(s): COLORURINE, LABSPEC, PHURINE, GLUCOSEU, HGBUR, BILIRUBINUR, KETONESUR, PROTEINUR, UROBILINOGEN, NITRITE, LEUKOCYTESUR in  the last 72 hours.  Invalid input(s): APPERANCEUR    Imaging: Dg Chest Port 1 View  Result Date: 01/13/2017 CLINICAL DATA:  Acute respiratory distress. EXAM: PORTABLE CHEST 1 VIEW COMPARISON:  Radiograph 09/09/2016, 08/21/2016 FINDINGS: Left-sided dialysis catheter in place. Patchy right perihilar opacities, not as prominent than on prior exam. Slight decrease in cardiomegaly. Unchanged mediastinal contours with aortic atherosclerosis. Suspect small right pleural effusion. No pneumothorax. IMPRESSION: Patchy right perihilar opacities are  improved from prior exam. Similar findings also seen on 08/21/2016. While findings may be due to asymmetric pulmonary edema, multifocal pneumonia, or aspiration, chronic lung opacities are also considered given the persistence since 08/21/2016. Consider chest CT characterization. Electronically Signed   By: Jeb Levering M.D.   On: 01/13/2017 00:53     Medications:   . sodium chloride     . aspirin EC  81 mg Oral Daily  . feeding supplement (ENSURE ENLIVE)  237 mL Oral TID BM  . furosemide  40 mg Intravenous Q12H  . heparin  5,000 Units Subcutaneous Q8H  . hydrALAZINE  50 mg Oral Q8H  . isosorbide mononitrate  30 mg Oral Daily  . losartan  150 mg Oral Daily  . metoprolol succinate  100 mg Oral Daily  . pantoprazole  40 mg Oral Daily  . sertraline  150 mg Oral Daily  . sevelamer carbonate  2,400 mg Oral TID WC  . sodium chloride flush  3 mL Intravenous Q12H  . warfarin  5 mg Oral ONCE-1800  . zolpidem  5 mg Oral QHS   sodium chloride, acetaminophen, hydrALAZINE, HYDROcodone-acetaminophen, ondansetron (ZOFRAN) IV, sodium chloride flush  Assessment/ Plan:  [name] is a 62 y.o. black female with ESRD on hemodialysis, breast cancer status post mastectomy, congestive heart failure, depression, DVT, seizure disorder, anemia, sickle cell trait, hypertension, gout, atrial fibrillation  [facility] [address]. MWF EDW 52.5kg  1. End Stage Renal Disease: emergent dialysis today for pulmonary edema - Orders prepared. UF goal of 2.5 liters.  - Monitor daily for dialysis need.  - MWF schedule.   2. Acute Respiratory Failure: on BIPAP on admission, now on [place] O2. - continue [place] O2 - Continue supportive care.   3. Hypertension: elevated. Volume driven - IV furosemide - losartan, hydralazine, imdur, furosemide, and metoprolol.  Echocardiogram with systolic and diastolic congestive heart failure EF 30-35% and moderate mitral valve regurg. 9/18 History of atrial fibrillation  on warfarin.   4. Anemia of chronic kidney disease: hemoglobin 11.8 - hold EPO  5. Secondary Hyperparathyroidism with hyperphosphatemia. Outpatient phos 5.3. PTH 910 and calcium at goal.  - Continue sevelamer.   LOS: 0 Teresa Cook 1/7/201910:13 AM

## 2017-01-13 NOTE — Progress Notes (Signed)
Pt in no respiratory distress at this time. Bipap not needed. Will continue to monitor pt. SPO2 96% in 1L .

## 2017-01-13 NOTE — ED Notes (Signed)
Nurse called respiratory for pt transport upstairs on BiPap

## 2017-01-13 NOTE — ED Notes (Signed)
Pt's neighbor is at bedside at this time. Neighbor stating that she had seen pt in SOB and decided to call EMS. Pt is tolerating BiPap at this time. Pt's o2 95%.

## 2017-01-13 NOTE — Progress Notes (Signed)
HD Started 

## 2017-01-13 NOTE — Progress Notes (Signed)
Post HD assessment  

## 2017-01-13 NOTE — Consult Note (Signed)
[facility] Card[facility] Consultation Note  Patient ID: Teresa Cook, MRN: [id number], DOB/AGE: 63-21-1957 63 y.o. Admit date: 01/12/2017   Date of Consult: 01/13/2017 Primary Physician: Murlean Iba, MD Primary Cardiologist: None  Chief Complaint:  Chief Complaint  Patient presents with  . Respiratory Distress   Reason for Consult: heart failure  HPI: 62 y.o. female with the known paroxysmal nonvalvular atrial fibrillation chronic kidney disease stage V with dialysis essential hypertension mixed hyperlipidemia diabetes with complication and previous stroke who is had dialysis for quite some time and done fairly well. The patient did have a hospitalization in September 2018 for which she had acute systolic diastolic dysfunction heart failure with pulmonary edema and minimal elevation of troponin. At that time she had a stress test showing no evidence of myocardial ischemia and an echocardiogram showing mild LV systolic dysfunction with ejection fraction of 45%. She was placed on appropriate medication management including angiotensin receptor blocker or beta blocker hydralazine and isosorbide. This is helped quite a bit until she had an acute episode of severe shortness of breath weakness fatigue and anxiety for which she was seen in the emergency room. At that time she did have a chest x-ray suggesting pulmonary edema but no evidence of increased weight or edema in the legs. With appropriate treatment the patient has felt much better with an elevated troponin of 0.07 consistent with demand ischemia rather than acute coronary syndrome. With dialysis today she is feeling much better and has not had any further significant symptoms. She does have atrial fibrillation paroxysmal nonvalvular with an EKG today showing normal sinus rhythm with left atrial enlargement left jugular hypertrophy with repolarization and currently is on anticoagulation as well  Past Medical History:  Diagnosis Date  .  Anemia   . Anxiety   . Breast cancer ([place]) 01/2016   bilateral  . CHF (congestive heart failure) ([place])   . Chronic kidney disease   . Depression   . Dialysis patient ([place])   . DVT (deep venous thrombosis) (HCC)    left leg  . DVT (deep venous thrombosis) ([place]) 1985   right thigh  . Dysrhythmia   . Gout   . Headache   . HTN (hypertension)   . Hypertension   . Parathyroid abnormality ([place])   . Parathyroid disease ([place])   . Pneumonia 12/2015  . Psoriasis   . Renal insufficiency   . Sickle cell trait ([place])    traits      Surgical History:  Past Surgical History:  Procedure Laterality Date  . ABDOMINAL HYSTERECTOMY  1980  . APPENDECTOMY    . BREAST BIOPSY Left 10/28/2013   benign  . BREAST EXCISIONAL BIOPSY Left 2002   benign  . INSERTION OF DIALYSIS CATHETER  2014  . LIPOMA EXCISION N/A 01/23/2016   Procedure: EXCISION LIPOMA;  Surgeon: Hubbard Robinson, MD;  Location: [facility] ORS;  Service: General;  Laterality: N/A;  . MASTECTOMY W/ SENTINEL NODE BIOPSY Bilateral 01/23/2016   Procedure: bilateral MASTECTOMY WITH  bilateral SENTINEL LYMPH NODE BIOPSY possible left axillary node dissection forehead lipoma removal;  Surgeon: Hubbard Robinson, MD;  Location: [facility] ORS;  Service: General;  Laterality: Bilateral;  . PARTIAL HYSTERECTOMY    . PERIPHERAL VASCULAR CATHETERIZATION N/A 12/25/2015   Procedure: Dialysis/Perma Catheter Insertion;  Surgeon: Algernon Huxley, MD;  Location: [facility];  Service: Cardiovascular;  Laterality: N/A;  . PERIPHERAL VASCULAR CATHETERIZATION Left 01/22/2016   Procedure: Dialysis/Perma Catheter Insertion;  Surgeon: Algernon Huxley,  MD;  Location: [facility];  Service: Cardiovascular;  Laterality: Left;  . PERIPHERAL VASCULAR CATHETERIZATION N/A 01/26/2016   Procedure: Dialysis/Perma Catheter Insertion;  Surgeon: Katha Cabal, MD;  Location: [facility];  Service: Cardiovascular;  Laterality: N/A;  . PORT-A-CATH REMOVAL N/A  12/20/2015   Procedure: REMOVAL PORT-A-CATH;  Surgeon: Hubbard Robinson, MD;  Location: [facility] ORS;  Service: General;  Laterality: N/A;  left    . PORTACATH PLACEMENT Left 08/21/2015   Procedure: INSERTION PORT-A-CATH;  Surgeon: Hubbard Robinson, MD;  Location: [facility] ORS;  Service: General;  Laterality: Left;  . REMOVAL OF A DIALYSIS CATHETER  2017     Home Meds: Prior to Admission medications   Medication Sig Start Date End Date Taking? Authorizing Provider  acetaminophen (TYLENOL) 325 MG tablet Take 2 tablets (650 mg total) by mouth every 6 (six) hours as needed for mild pain (or Fever >/= 101). 12/27/15  Yes [name], [name], MD  b complex-vitamin c-folic acid (NEPHRO-VITE) 0.8 MG TABS tablet Take 1 tablet by mouth daily.   Yes [provider]  cinacalcet (SENSIPAR) 60 MG tablet Take 60 mg by mouth daily.   Yes [provider]  hydrALAZINE (APRESOLINE) 50 MG tablet Take 1 tablet (50 mg total) by mouth every 8 (eight) hours. 09/14/16  Yes Dustin Flock, MD  HYDROcodone-acetaminophen (NORCO/VICODIN) 5-325 MG tablet Take 1 tablet by mouth every 4 (four) hours as needed for moderate pain. 09/14/16  Yes Dustin Flock, MD  isosorbide mononitrate (IMDUR) 30 MG 24 hr tablet Take 1 tablet (30 mg total) by mouth daily. 09/14/16  Yes Dustin Flock, MD  losartan (COZAAR) 50 MG tablet Take 3 tablets (150 mg total) by mouth daily. 09/14/16  Yes Dustin Flock, MD  metoprolol succinate (TOPROL-XL) 100 MG 24 hr tablet Take 1 tablet (100 mg total) by mouth daily. Take with or immediately following a meal. 09/14/16  Yes Dustin Flock, MD  pantoprazole (PROTONIX) 40 MG tablet Take 1 tablet (40 mg total) by mouth daily. 03/01/16  Yes [name], MD  sertraline (ZOLOFT) 50 MG tablet Take 3 tablets (150 mg total) by mouth daily. Patient taking differently: Take 200 mg by mouth daily.  03/01/16  Yes [name], MD  sevelamer carbonate (RENVELA) 800 MG tablet Take 2,400 mg by  mouth 3 (three) times daily with meals. Tid with meals and 1 tablet with snacks once a day   Yes [provider]  traZODone (DESYREL) 50 MG tablet Take 50 mg by mouth at bedtime. May increase to 2 tablets if needed.   Yes [provider]  warfarin (COUMADIN) 5 MG tablet take 5 mg on friday, saturday, sunday. and 7.5 mg on monday, tuesday, wednesday, thursday 11/03/13  Yes [provider]  zolpidem (AMBIEN) 5 MG tablet Take 1 tablet (5 mg total) by mouth at bedtime. Patient taking differently: Take 10 mg by mouth at bedtime.  03/01/16  Yes [name], MD  amoxicillin-clavulanate (AUGMENTIN) 500-125 MG tablet Take 1 tablet by mouth at bedtime. 09/16/16   [provider]  feeding supplement, ENSURE ENLIVE, (ENSURE ENLIVE) LIQD Take 237 mLs by mouth 3 (three) times daily between meals. Patient taking differently: Take 237 mLs by mouth daily.  12/27/15   [name], MD  letrozole (FEMARA) 2.5 MG tablet Take 1 tablet (2.5 mg total) by mouth daily. Patient not taking: Reported on 09/19/2016 05/07/16   [name], MD  ondansetron (ZOFRAN) 8 MG tablet Take by mouth every 8 (eight)  hours as needed for nausea or vomiting.    [provider]  prochlorperazine (COMPAZINE) 10 MG tablet Take 10 mg by mouth every 6 (six) hours as needed for nausea or vomiting.    [provider]  traMADol (ULTRAM) 50 MG tablet Take 1 tablet (50 mg total) by mouth every 6 (six) hours as needed for moderate pain. Patient not taking: Reported on 01/13/2017 08/23/16   [name], MD  UNABLE TO FIND Med Name: Prosthetic Bra with Inserts 04/23/16   [name], MD  warfarin (COUMADIN) 10 MG tablet Take 1 tablet (10 mg total) by mouth one time only at 6 PM. Patient not taking: Reported on 09/19/2016 09/14/16   Dustin Flock, MD    Inpatient Medications:  . aspirin EC  81 mg Oral Daily  . feeding supplement (ENSURE ENLIVE)  237 mL Oral TID BM  . furosemide   40 mg Intravenous Q12H  . heparin  5,000 Units Subcutaneous Q8H  . hydrALAZINE  50 mg Oral Q8H  . isosorbide mononitrate  30 mg Oral Daily  . losartan  150 mg Oral Daily  . metoprolol succinate  100 mg Oral Daily  . pantoprazole  40 mg Oral Daily  . sertraline  150 mg Oral Daily  . sevelamer carbonate  2,400 mg Oral TID WC  . sodium chloride flush  3 mL Intravenous Q12H  . warfarin  5 mg Oral ONCE-1800  . zolpidem  5 mg Oral QHS   . sodium chloride      Allergies:  Allergies  Allergen Reactions  . Gabapentin Other (See Comments)    Seizure  . Adhesive [Tape] Itching    Silk tape is ok to use.    Social History   Socioeconomic History  . Marital status: Widowed    Spouse name: Not on file  . Number of children: Not on file  . Years of education: Not on file  . Highest education level: Not on file  Social Needs  . Financial resource strain: Not on file  . Food insecurity - worry: Not on file  . Food insecurity - inability: Not on file  . Transportation needs - medical: Not on file  . Transportation needs - non-medical: Not on file  Occupational History  . Occupation: disabled  Tobacco Use  . Smoking status: Never Smoker  . Smokeless tobacco: Never Used  Substance and Sexual Activity  . Alcohol use: No    Alcohol/week: 0.0 oz  . Drug use: No  . Sexual activity: No    Birth control/protection: Surgical  Other Topics Concern  . Not on file  Social History Narrative   ** Merged History Encounter **         Family History  Problem Relation Age of Onset  . Stroke Mother   . CVA Mother   . Hypertension Mother   . Hypertension Father   . Hypertension Sister   . Diabetes Sister   . Cancer Sister 40       Breast  . Stroke Brother   . Hypertension Brother   . Breast cancer Maternal Aunt 70     Review of Systems Positive for shortness of breath PND orthopnea Negative for: General:  chills, fever, night sweats or weight changes.  Cardiovascular: Positive  for PND orthopnea negative for syncope dizziness  Dermatological skin lesions rashes Respiratory: Cough congestion Urologic: Frequent urination urination at night and hematuria Abdominal: negative for nausea, vomiting, diarrhea, bright red blood per rectum, melena,  or hematemesis Neurologic: negative for visual changes, and/or hearing changes  All other systems reviewed and are otherwise negative except as noted above.  Labs: Recent Labs    01/13/17 0049 01/13/17 0353 01/13/17 0909  TROPONINI 0.05* 0.06* 0.07*   Lab Results  Component Value Date   WBC 8.2 01/13/2017   HGB 10.9 (L) 01/13/2017   HCT 32.9 (L) 01/13/2017   MCV 88.1 01/13/2017   PLT 208 01/13/2017    Recent Labs  Lab 01/13/17 0049 01/13/17 1020  NA 140 138  K 4.0 4.7  CL 102 98*  CO2 26 25  BUN 55* 63*  CREATININE 8.64* 9.86*  CALCIUM 7.6* 8.5*  PROT 6.2*  --   BILITOT 0.4  --   ALKPHOS 95  --   ALT 27  --   AST 24  --   GLUCOSE 113* 93   Lab Results  Component Value Date   CHOL 250 (H) 02/03/2014   HDL 35 (L) 02/03/2014   LDLCALC 186 (H) 02/03/2014   TRIG 144 02/03/2014   No results found for: DDIMER  Radiology/Studies:  Dg Chest Port 1 View  Result Date: 01/13/2017 CLINICAL DATA:  Acute respiratory distress. EXAM: PORTABLE CHEST 1 VIEW COMPARISON:  Radiograph 09/09/2016, 08/21/2016 FINDINGS: Left-sided dialysis catheter in place. Patchy right perihilar opacities, not as prominent than on prior exam. Slight decrease in cardiomegaly. Unchanged mediastinal contours with aortic atherosclerosis. Suspect small right pleural effusion. No pneumothorax. IMPRESSION: Patchy right perihilar opacities are improved from prior exam. Similar findings also seen on 08/21/2016. While findings may be due to asymmetric pulmonary edema, multifocal pneumonia, or aspiration, chronic lung opacities are also considered given the persistence since 08/21/2016. Consider chest CT characterization. Electronically Signed   By:  Jeb Levering M.D.   On: 01/13/2017 00:53    EKG: Normal sinus rhythm with left atrial enlargement and left ventricular hypertrophy with repolarization  Weights: Filed Weights   01/13/17 0318 01/13/17 1006  Weight: 53.3 kg (117 lb 8.1 oz) 55.7 kg (122 lb 12.7 oz)     Physical Exam: Blood pressure (!) 137/92, pulse 90, temperature 98 F (36.7 C), resp. rate (!) 24, height 5\' 5"  (1.651 m), weight 55.7 kg (122 lb 12.7 oz), SpO2 100 %. Body mass index is 20.43 kg/m. General: Well developed, well nourished, in no acute distress. Head eyes ears nose throat: Normocephalic, atraumatic, sclera non-icteric, no xanthomas, nares are without discharge. No apparent thyromegaly and/or mass  Lungs: Normal respiratory effort.  no wheezes, basilar rales, no rhonchi.  Heart: RRR with normal S1 S2. 2-3+ left sternal murmur gallop, no rub, PMI is normal size and placement, carotid upstroke normal without bruit, jugular venous pressure is normal Abdomen: Soft, non-tender, non-distended with normoactive bowel sounds. No hepatomegaly. No rebound/guarding. No obvious abdominal masses. Abdominal aorta is normal size without bruit Extremities: Trace edema. no cyanosis, no clubbing, no ulcers  Peripheral : 2+ bilateral upper extremity pulses, 2+ bilateral femoral pulses, 2+ bilateral dorsal pedal pulse Neuro: Alert and oriented. No facial asymmetry. No focal deficit. Moves all extremities spontaneously. Musculoskeletal: Normal muscle tone without kyphosis Psych:  Responds to questions appropriately with a normal affect.    Assessment: 62 year old female with acute on chronic diastolic and systolic combined congestive heart failure chronic kidney disease stage III essential hypertension diabetes and peripheral vascular disease with paroxysmal nonvalvular atrial fibrillation improved with dialysis and appropriate continued medication management without evidence of myocardial infarction  Plan: 1. Continue  supportive care of pulmonary edema with diuresis  and dialysis 2. Continue hypertension control with the previous medication management with a goal systolic blood pressure below 130 mm 3. Warfarin for further risk reduction in stroke with atrial fibrillation with a goal INR between 2 and 3 4. No further cardiac diagnostics necessary at this time due to no evidence of myocardial infarction and previously assessed ejection fraction of 45% 5. Further education for reduced rehospitalization for congestive heart failure 6. Okay for transfer to telemetry  Signed, Corey Skains M.D. [facility] 01/13/2017, 12:44 PM

## 2017-01-13 NOTE — Progress Notes (Signed)
HD tx end  

## 2017-01-13 NOTE — Progress Notes (Signed)
Initial Nutrition Assessment  DOCUMENTATION CODES:   Severe malnutrition in context of chronic illness  INTERVENTION:  Continue Ensure Enlive po TID, each supplement provides 350 kcal and 20 grams of protein. Patient prefers to have them with meals instead of between meals.  Provide Rena-vite QHS.  Updated HealthTouch to reflect that patient does not eat any pork products.  NUTRITION DIAGNOSIS:   Severe Malnutrition related to chronic illness(CHF, ESRD on HD, hx breast cancer) as evidenced by severe fat depletion, severe muscle depletion.  GOAL:   Patient will meet greater than or equal to 90% of their needs  MONITOR:   PO intake, Supplement acceptance, Labs, Weight trends, I & O's  REASON FOR ASSESSMENT:   Malnutrition Screening Tool    ASSESSMENT:   62 year old female with PMHx of HTN, hx DVT in left leg and right thigh, ESRD on HD, anxiety, depression, gout, CHF, hx breast cancer s/p bilateral mastectomy now admitted with acute respiratory failure from CHF exacerbation.   Met with patient at bedside while she was receiving HD in ICU. She reports she has had a poor appetite for 3-4 months now. She only eats one small meal daily now. She may have chicken or fish with potatoes. She reports her dialysis RD has liberalized her diet because of her poor appetite. She was drinking Nepro but she cannot afford them. She now drinks Boost or Ensure TID with meals. She denies any N/V, abdominal pain, constipation/diarrhea. Does endorse some difficulty swallowing - reports it feels like the food gets stuck in her throat occasionally. She does not eat pork. She reports she has been on HD for 3-4 years now. She reports she still makes a small amount of urine daily.  UBW was 180 lbs. She reports she has lost her weight over a long period of time. Per review of chart patient has remained fairly weight-stable over the past year. She reports her dry weight is 52 kg.  Medications reviewed and  include: Lasix 40 mg Q12hrs IV, pantoprazole, Renvela 2400 mg TID with meals, warfarin.  Labs reviewed: CBG 100, Chloride 98, BUN 63, Creatinine 9.86, Phosphorus 5.6.  Discussed with RN.  NUTRITION - FOCUSED PHYSICAL EXAM:    Most Recent Value  Orbital Region  Severe depletion  Upper Arm Region  Severe depletion  Thoracic and Lumbar Region  Moderate depletion  Buccal Region  Severe depletion  Temple Region  Severe depletion  Clavicle Bone Region  Severe depletion  Clavicle and Acromion Bone Region  Severe depletion  Scapular Bone Region  Severe depletion  Dorsal Hand  Severe depletion  Patellar Region  Severe depletion  Anterior Thigh Region  Severe depletion  Posterior Calf Region  Moderate depletion  Edema (RD Assessment)  None  Hair  Reviewed  Eyes  Reviewed  Mouth  Reviewed  Skin  Reviewed  Nails  Reviewed     Diet Order:  Diet renal/carb modified with fluid restriction Diet-HS Snack? Nothing; Fluid restriction: 1200 mL Fluid; Room service appropriate? Yes; Fluid consistency: Thin  EDUCATION NEEDS:   No education needs have been identified at this time  Skin:  Skin Assessment: Reviewed RN Assessment  Last BM:  01/13/2016  Height:   Ht Readings from Last 1 Encounters:  01/13/17 _0  (1.651 m)    Weight:   Wt Readings from Last 1 Encounters:  01/13/17 117 lb 11.6 oz (53.4 kg)    Ideal Body Weight:  56.8 kg  BMI:  Body mass index is 19.59 kg/m.  Estimated Nutritional Needs:   Kcal:  1600-1870 (30-35 kcal/kg)  Protein:  80-90 grams (1.5-1.7 grams/kg)  Fluid:  UOP + 1 L  Willey Blade, MS, RD, LDN Office: [phone] Pager: [phone] After Hours/Weekend Pager: [phone]

## 2017-01-13 NOTE — ED Notes (Signed)
Pt given 2nd pillow and repositioned. Pt in NAD at this time. Pt is asking to remove Bipap at this time. Nurse explained that she would have to speak with Dr. Beather Arbour

## 2017-01-13 NOTE — Progress Notes (Signed)
Report called to Annabell, RN on 2C. Patient currently still received HD, will transfer when dialysis is completed. Wilnette Kales

## 2017-01-13 NOTE — Progress Notes (Signed)
eLink Physician-Brief Progress Note Patient Name: Jennfer Gassen DOB: 03-May-1955 MRN: [id number]   Date of Service  01/13/2017  HPI/Events of Note  62 y.o. female with a known history of end-stage renal disease on dialysis, DVT, atrial fibrillation on Coumadin for anticoagulation, gout, hypertension, parathyroid disease presented to the emergency room with shortness of breath.  Patient became acutely short of breath yesterday and her oxygen level was low.  She was put on BiPAP and stabilized in the emergency room.  She was found to be in fluid overload and pulmonary edema. PCCM asked to assume care in the ICU. Now on BiPAP, Lasix and antihypertensive regimen. VSS.  eICU Interventions  No new orders.      Intervention Category Evaluation Type: New Patient Evaluation  Lysle Dingwall 01/13/2017, 3:42 AM

## 2017-01-13 NOTE — Progress Notes (Signed)
Patient was admitted from ED with bipap. Pt requested taking off bipap to see how she did on nasal cannula, but was unable to maintain sats and had to be placed back on bipap. Tolerated bipap this shift. Did have a headache that was medicated as well as a bout of nausea that was also medicated and effective.

## 2017-01-14 LAB — BASIC METABOLIC PANEL
ANION GAP: 10 (ref 5–15)
BUN: 41 mg/dL — ABNORMAL HIGH (ref 6–20)
CALCIUM: 9.2 mg/dL (ref 8.9–10.3)
CO2: 30 mmol/L (ref 22–32)
Chloride: 97 mmol/L — ABNORMAL LOW (ref 101–111)
Creatinine, Ser: 6.51 mg/dL — ABNORMAL HIGH (ref 0.44–1.00)
GFR, EST AFRICAN AMERICAN: 7 mL/min — AB (ref >60)
GFR, EST NON AFRICAN AMERICAN: 6 mL/min — AB (ref >60)
Glucose, Bld: 99 mg/dL (ref 65–99)
Potassium: 4.8 mmol/L (ref 3.5–5.1)
SODIUM: 137 mmol/L (ref 135–145)

## 2017-01-14 LAB — PROTIME-INR
INR: 1.53
PROTHROMBIN TIME: 18.3 s — AB (ref 11.4–15.2)

## 2017-01-14 LAB — CBC
HCT: 33.8 % — ABNORMAL LOW (ref 35.0–47.0)
Hemoglobin: 11.3 g/dL — ABNORMAL LOW (ref 12.0–16.0)
MCH: 29.6 pg (ref 26.0–34.0)
MCHC: 33.3 g/dL (ref 32.0–36.0)
MCV: 88.9 fL (ref 80.0–100.0)
PLATELETS: 225 10*3/uL (ref 150–440)
RBC: 3.81 MIL/uL (ref 3.80–5.20)
RDW: 17.5 % — AB (ref 11.5–14.5)
WBC: 8.2 10*3/uL (ref 3.6–11.0)

## 2017-01-14 MED ORDER — PANTOPRAZOLE SODIUM 40 MG PO TBEC: 40.0000 mg | DELAYED_RELEASE_TABLET | Freq: Every day | ORAL | 0 refills | Status: DC

## 2017-01-14 MED ORDER — ASPIRIN 81 MG PO TBEC: 81.0000 mg | DELAYED_RELEASE_TABLET | Freq: Every day | ORAL | 0 refills | Status: DC

## 2017-01-14 MED ORDER — HYDROCODONE-ACETAMINOPHEN 5-325 MG PO TABS: 1.0000 | ORAL_TABLET | ORAL | 0 refills | Status: DC | PRN

## 2017-01-14 NOTE — Progress Notes (Signed)
Pt arrived back to room BP elevated otherwise no symptoms. BP meds held for dialysis. Per MD okay to place discharge order, also okay per MD to give only hydralazine and metoprolol. Will continue to monitor prior to patient discharge.

## 2017-01-14 NOTE — Progress Notes (Signed)
Per Dr. Vianne Bulls okay to hold heparin for elevated PT time

## 2017-01-14 NOTE — Progress Notes (Signed)
Pt refusing to take Metoprolol and hydralazine and BP increased notified MD of BP. RN to check manual and notify MD.

## 2017-01-14 NOTE — Progress Notes (Signed)
RN attempted to administer 0600 medications, patient requested to wait until later in the morning.  RN rescheduled for later in the AM and will report to on coming RN.

## 2017-01-14 NOTE — Care Management (Signed)
Teresa Cook HD liaison notified of admission.  

## 2017-01-14 NOTE — Progress Notes (Signed)
[facility] Card[facility] Encounter Note  Patient: Teresa Cook / Admit Date: 01/12/2017 / Date of Encounter: 01/14/2017, 8:15 AM   Subjective: Patient feels much better today.  Less shortness of breath after dialysis and no evidence of congestive heart failure symptoms.  No evidence of myocardial infarction with minimal elevation of troponin more consistent with demand ischemia with acute on chronic systolic dysfunction heart failure.  Patient has continued to normal sinus rhythm with left ventricular hypertrophy.  Review of Systems: Positive for: Shortness of breath Negative for: Vision change, hearing change, syncope, dizziness, nausea, vomiting,diarrhea, bloody stool, stomach pain, cough, congestion, diaphoresis, urinary frequency, urinary pain,skin lesions, skin rashes Others previously listed  Objective: Telemetry: S rhythm Physical Exam: Blood pressure 130/82, pulse 72, temperature 98.6 F (37 C), temperature source Oral, resp. rate 20, height 5\' 5"  (1.651 m), weight 53.4 kg (117 lb 11.6 oz), SpO2 93 %. Body mass index is 19.59 kg/m. General: Well developed, well nourished, in no acute distress. Head: Normocephalic, atraumatic, sclera non-icteric, no xanthomas, nares are without discharge. Neck: No apparent masses Lungs: Normal respirations with no wheezes, no rhonchi, no rales , few basilar crackles   Heart: Regular rate and rhythm, normal S1 S2, no murmur, no rub, no gallop, PMI is normal size and placement, carotid upstroke normal without bruit, jugular venous pressure normal Abdomen: Soft, non-tender, non-distended with normoactive bowel sounds. No hepatosplenomegaly. Abdominal aorta is normal size without bruit Extremities: Trace edema, no clubbing, no cyanosis, no ulcers,  Peripheral: 2+ radial, 2+ femoral, 2+ dorsal pedal pulses Neuro: Alert and oriented. Moves all extremities spontaneously. Psych:  Responds to questions appropriately with a normal  affect.   Intake/Output Summary (Last 24 hours) at 01/14/2017 0815 Last data filed at 01/14/2017 0720 Gross per 24 hour  Intake 183 ml  Output 1957 ml  Net -1774 ml    Inpatient Medications:  . aspirin EC  81 mg Oral Daily  . feeding supplement (ENSURE ENLIVE)  237 mL Oral TID WC  . furosemide  40 mg Intravenous Q12H  . heparin  5,000 Units Subcutaneous Q8H  . hydrALAZINE  50 mg Oral Q8H  . isosorbide mononitrate  30 mg Oral Daily  . losartan  150 mg Oral Daily  . metoprolol succinate  100 mg Oral Daily  . multivitamin  1 tablet Oral QHS  . pantoprazole  40 mg Oral Daily  . sertraline  150 mg Oral Daily  . sevelamer carbonate  2,400 mg Oral TID WC  . sodium chloride flush  3 mL Intravenous Q12H  . zolpidem  5 mg Oral QHS   Infusions:  . sodium chloride      Labs: Recent Labs    01/13/17 1020 01/14/17 0426  NA 138 137  K 4.7 4.8  CL 98* 97*  CO2 25 30  GLUCOSE 93 99  BUN 63* 41*  CREATININE 9.86* 6.51*  CALCIUM 8.5* 9.2  PHOS 5.6*  --    Recent Labs    01/13/17 0049 01/13/17 1020  AST 24  --   ALT 27  --   ALKPHOS 95  --   BILITOT 0.4  --   PROT 6.2*  --   ALBUMIN 3.3* 3.2*   Recent Labs    01/12/17 2343 01/13/17 0353 01/13/17 1020  WBC 11.7* 10.4 8.2  NEUTROABS 7.8*  --   --   HGB 12.4 11.8* 10.9*  HCT 37.3 34.5* 32.9*  MCV 89.1 88.7 88.1  PLT 268 209 208   Recent Labs  01/13/17 0049 01/13/17 0353 01/13/17 0909 01/13/17 1449  TROPONINI 0.05* 0.06* 0.07* 0.06*   Invalid input(s): POCBNP No results for input(s): HGBA1C in the last 72 hours.   Weights: Filed Weights   01/13/17 0318 01/13/17 1006 01/13/17 1342  Weight: 53.3 kg (117 lb 8.1 oz) 55.7 kg (122 lb 12.7 oz) 53.4 kg (117 lb 11.6 oz)     Radiology/Studies:  Dg Chest Port 1 View  Result Date: 01/13/2017 CLINICAL DATA:  Acute respiratory distress. EXAM: PORTABLE CHEST 1 VIEW COMPARISON:  Radiograph 09/09/2016, 08/21/2016 FINDINGS: Left-sided dialysis catheter in place. Patchy  right perihilar opacities, not as prominent than on prior exam. Slight decrease in cardiomegaly. Unchanged mediastinal contours with aortic atherosclerosis. Suspect small right pleural effusion. No pneumothorax. IMPRESSION: Patchy right perihilar opacities are improved from prior exam. Similar findings also seen on 08/21/2016. While findings may be due to asymmetric pulmonary edema, multifocal pneumonia, or aspiration, chronic lung opacities are also considered given the persistence since 08/21/2016. Consider chest CT characterization. Electronically Signed   By: Jeb Levering M.D.   On: 01/13/2017 00:53     Assessment and Recommendation  62 y.o. female with acute on chronic combined systolic diastolic dysfunction congestive heart failure with ejection fraction of 40% essential hypertension mixed hyperlipidemia diabetes and history of atrial fibrillation currently improved at this time after dialysis and reduced fluid.  Elevation of troponin more consistent with demand ischemia rather than acute coronary syndrome 1.  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  No further cardiac diagnostics necessary at this time 3.  Continue dialysis without restriction and additional diuresis with Lasix as able 4.  New antihypertensives as previous without change at this time due to reasonable control including isosorbide and beta-blocker for risk reduction and recurrent heart failure 5.  Okay for discharge home from cardiac standpoint with follow-up in 1 week for further medication management adjustments  Signed, Serafina Royals M.D. FACC

## 2017-01-14 NOTE — Progress Notes (Signed)
HD completed without issue. 2L UF. Lungs clear. No current complaints.

## 2017-01-14 NOTE — Progress Notes (Signed)
Post HD assessment. Lungs clear

## 2017-01-14 NOTE — Progress Notes (Signed)
Teresa Cook  A and O x 4. VSS. Pt tolerating diet well. No complaints of pain or nausea. IV removed intact, prescriptions given. Pt voiced understanding of discharge instructions with no further questions. Pt discharged via wheelchair with nurse aide.  Lynann Bologna MSN, RN-BC  Allergies as of 01/14/2017      Reactions   Gabapentin Other (See Comments)   Seizure   Adhesive [tape] Itching   Silk tape is ok to use.      Medication List    STOP taking these medications   amoxicillin-clavulanate 500-125 MG tablet Commonly known as:  AUGMENTIN   ondansetron 8 MG tablet Commonly known as:  ZOFRAN   UNABLE TO FIND     TAKE these medications   acetaminophen 325 MG tablet Commonly known as:  TYLENOL Take 2 tablets (650 mg total) by mouth every 6 (six) hours as needed for mild pain (or Fever >/= 101).   aspirin 81 MG EC tablet Take 1 tablet (81 mg total) by mouth daily.   b complex-vitamin c-folic acid 0.8 MG Tabs tablet Take 1 tablet by mouth daily.   cinacalcet 60 MG tablet Commonly known as:  SENSIPAR Take 60 mg by mouth daily.   feeding supplement (ENSURE ENLIVE) Liqd Take 237 mLs by mouth 3 (three) times daily between meals. What changed:  when to take this   hydrALAZINE 50 MG tablet Commonly known as:  APRESOLINE Take 1 tablet (50 mg total) by mouth every 8 (eight) hours.   HYDROcodone-acetaminophen 5-325 MG tablet Commonly known as:  NORCO/VICODIN Take 1 tablet by mouth every 4 (four) hours as needed for moderate pain.   isosorbide mononitrate 30 MG 24 hr tablet Commonly known as:  IMDUR Take 1 tablet (30 mg total) by mouth daily.   letrozole 2.5 MG tablet Commonly known as:  FEMARA Take 1 tablet (2.5 mg total) by mouth daily.   losartan 50 MG tablet Commonly known as:  COZAAR Take 3 tablets (150 mg total) by mouth daily.   metoprolol succinate 100 MG 24 hr tablet Commonly known as:  TOPROL-XL Take 1 tablet (100 mg total) by mouth daily. Take with or  immediately following a meal.   pantoprazole 40 MG tablet Commonly known as:  PROTONIX Take 1 tablet (40 mg total) by mouth daily.   prochlorperazine 10 MG tablet Commonly known as:  COMPAZINE Take 10 mg by mouth every 6 (six) hours as needed for nausea or vomiting.   sertraline 50 MG tablet Commonly known as:  ZOLOFT Take 3 tablets (150 mg total) by mouth daily. What changed:  how much to take   sevelamer carbonate 800 MG tablet Commonly known as:  RENVELA Take 2,400 mg by mouth 3 (three) times daily with meals. Tid with meals and 1 tablet with snacks once a day   traMADol 50 MG tablet Commonly known as:  ULTRAM Take 1 tablet (50 mg total) by mouth every 6 (six) hours as needed for moderate pain.   traZODone 50 MG tablet Commonly known as:  DESYREL Take 50 mg by mouth at bedtime. May increase to 2 tablets if needed.   warfarin 5 MG tablet Commonly known as:  COUMADIN take 5 mg on friday, saturday, sunday. and 7.5 mg on monday, tuesday, wednesday, thursday What changed:  Another medication with the same name was removed. Continue taking this medication, and follow the directions you see here.   zolpidem 5 MG tablet Commonly known as:  AMBIEN Take 1 tablet (5 mg total)  by mouth at bedtime. What changed:  how much to take       Vitals:   01/14/17 1743 01/14/17 1818  BP: (!) 174/100 (!) 143/94  Pulse:  90  Resp:    Temp:    SpO2:

## 2017-01-14 NOTE — Progress Notes (Signed)
Pre HD  

## 2017-01-14 NOTE — Progress Notes (Signed)
[facility]  ROUNDING NOTE   Subjective:   Hemodialysis treatment yesterday. UF 1.9 liters. Patient states she feels well. Tolerated treatment well.    Objective:  Vital signs in last 24 hours:  Temp:  [98.4 F (36.9 C)-98.9 F (37.2 C)] 98.6 F (37 C) (01/08 0506) Pulse Rate:  [72-90] 85 (01/08 1135) Resp:  [16-27] 20 (01/08 0506) BP: (129-155)/(75-102) 139/87 (01/08 1135) SpO2:  [93 %-100 %] 96 % (01/08 1135) Weight:  [53.4 kg (117 lb 11.6 oz)] 53.4 kg (117 lb 11.6 oz) (01/07 1342)  Weight change: 2.4 kg (5 lb 4.7 oz) Filed Weights   01/13/17 0318 01/13/17 1006 01/13/17 1342  Weight: 53.3 kg (117 lb 8.1 oz) 55.7 kg (122 lb 12.7 oz) 53.4 kg (117 lb 11.6 oz)    Intake/Output: I/O last 3 completed shifts: In: 183 [P.O.:180; I.V.:3] Out: 2007 [Urine:50; KZSWF:0932]   Intake/Output this shift:  No intake/output data recorded.  Physical Exam: General: Laying in bed, ill appearing  Head: [place]/AT  Eyes: Anicteric, PERRL  Neck: Supple, trachea midline  Lungs:  Bibasilar crackles  Heart: Regular rate and rhythm  Abdomen:  Soft, nontender  Extremities: no peripheral edema.  Neurologic: Nonfocal, moving all four extremities  Skin: No lesions  Access: LIJ permcath    Basic Metabolic Panel: Recent Labs  Lab 01/13/17 0049 01/13/17 1020 01/14/17 0426  NA 140 138 137  K 4.0 4.7 4.8  CL 102 98* 97*  CO2 26 25 30   GLUCOSE 113* 93 99  BUN 55* 63* 41*  CREATININE 8.64* 9.86* 6.51*  CALCIUM 7.6* 8.5* 9.2  PHOS  --  5.6*  --     Liver Function Tests: Recent Labs  Lab 01/13/17 0049 01/13/17 1020  AST 24  --   ALT 27  --   ALKPHOS 95  --   BILITOT 0.4  --   PROT 6.2*  --   ALBUMIN 3.3* 3.2*   No results for input(s): LIPASE, AMYLASE in the last 168 hours. No results for input(s): AMMONIA in the last 168 hours.  CBC: Recent Labs  Lab 01/12/17 2343 01/13/17 0353 01/13/17 1020  WBC 11.7* 10.4 8.2  NEUTROABS 7.8*  --   --   HGB 12.4 11.8* 10.9*   HCT 37.3 34.5* 32.9*  MCV 89.1 88.7 88.1  PLT 268 209 208    Cardiac Enzymes: Recent Labs  Lab 01/13/17 0049 01/13/17 0353 01/13/17 0909 01/13/17 1449  TROPONINI 0.05* 0.06* 0.07* 0.06*    BNP: Invalid input(s): POCBNP  CBG: Recent Labs  Lab 01/13/17 0316  GLUCAP 100*    Microbiology: Results for orders placed or performed during the hospital encounter of 01/12/17  MRSA PCR Screening     Status: None   Collection Time: 01/13/17  3:48 AM  Result Value Ref Range Status   MRSA by PCR NEGATIVE NEGATIVE Final    Comment:        The GeneXpert MRSA Assay (FDA approved for NASAL specimens only), is one component of a comprehensive MRSA colonization surveillance program. It is not intended to diagnose MRSA infection nor to guide or monitor treatment for MRSA infections. Performed at [facility], [address] [place], [place] [address]     Coagulation Studies: Recent Labs    01/12/17 2343 01/14/17 0426  LABPROT 16.2* 18.3*  INR 1.31 1.53    Urinalysis: No results for input(s): COLORURINE, LABSPEC, PHURINE, GLUCOSEU, HGBUR, BILIRUBINUR, KETONESUR, PROTEINUR, UROBILINOGEN, NITRITE, LEUKOCYTESUR in the last 72 hours.  Invalid input(s):  APPERANCEUR    Imaging: Dg Chest Port 1 View  Result Date: 01/13/2017 CLINICAL DATA:  Acute respiratory distress. EXAM: PORTABLE CHEST 1 VIEW COMPARISON:  Radiograph 09/09/2016, 08/21/2016 FINDINGS: Left-sided dialysis catheter in place. Patchy right perihilar opacities, not as prominent than on prior exam. Slight decrease in cardiomegaly. Unchanged mediastinal contours with aortic atherosclerosis. Suspect small right pleural effusion. No pneumothorax. IMPRESSION: Patchy right perihilar opacities are improved from prior exam. Similar findings also seen on 08/21/2016. While findings may be due to asymmetric pulmonary edema, multifocal pneumonia, or aspiration, chronic lung opacities are also considered given the  persistence since 08/21/2016. Consider chest CT characterization. Electronically Signed   By: Jeb Levering M.D.   On: 01/13/2017 00:53     Medications:   . sodium chloride     . aspirin EC  81 mg Oral Daily  . feeding supplement (ENSURE ENLIVE)  237 mL Oral TID WC  . furosemide  40 mg Intravenous Q12H  . heparin  5,000 Units Subcutaneous Q8H  . hydrALAZINE  50 mg Oral Q8H  . isosorbide mononitrate  30 mg Oral Daily  . losartan  150 mg Oral Daily  . metoprolol succinate  100 mg Oral Daily  . multivitamin  1 tablet Oral QHS  . pantoprazole  40 mg Oral Daily  . sertraline  150 mg Oral Daily  . sevelamer carbonate  2,400 mg Oral TID WC  . sodium chloride flush  3 mL Intravenous Q12H  . zolpidem  5 mg Oral QHS   sodium chloride, acetaminophen, hydrALAZINE, HYDROcodone-acetaminophen, ondansetron (ZOFRAN) IV, sodium chloride flush  Assessment/ Plan:  [name] is a 62 y.o. black female with ESRD on hemodialysis, breast cancer status post mastectomy, congestive heart failure, depression, DVT, seizure disorder, anemia, sickle cell trait, hypertension, gout, atrial fibrillation  [facility] [address]. MWF EDW 52.5kg  1. End Stage Renal Disease: emergent dialysis yesterday for pulmonary edema - Extra hemodialysis treatment for today. Orders prepared. UF goal of 2 liters.  - Monitor daily for dialysis need.  - MWF schedule.   2. Acute Respiratory Failure: on BIPAP on admission, now room air - Continue supportive care.   3. Hypertension: elevated. Volume driven - IV furosemide given - losartan, hydralazine, imdur, furosemide, and metoprolol.  Echocardiogram with systolic and diastolic congestive heart failure EF 30-35% and moderate mitral valve regurg. 9/18 History of atrial fibrillation on warfarin.   4. Anemia of chronic kidney disease: hemoglobin 10.9 - hold EPO  5. Secondary Hyperparathyroidism with hyperphosphatemia. Outpatient phos 5.3. PTH 910 and calcium  at goal.  - Continue sevelamer.   LOS: [place], [place] 1/8/201912:20 PM

## 2017-01-14 NOTE — Discharge Summary (Signed)
Teresa Cook, is a 62 y.o. female  DOB [birth year]  MRN [id number].  Admission date:  01/12/2017  Admitting Physician  Saundra Shelling, MD  Discharge Date:  01/14/2017   Primary MD  Murlean Iba, MD  Recommendations for primary care physician for things to follow:   Up with Dr. Melrose Nakayama from neurology in 1 week Follow-up with nephrology for routine dialysis needs. Follow-up with Dr. Nehemiah Massed in 2 weeks.  Admission Diagnosis  Acute pulmonary edema (HCC) [J81.0] Respiratory distress [R06.03] Flash pulmonary edema (HCC) [J81.0] Acute respiratory failure, unspecified whether with hypoxia or hypercapnia (HCC) [J96.00] Acute renal failure superimposed on chronic kidney disease, on chronic dialysis, unspecified acute renal failure type ([place]) [N17.9, N18.9, Z99.2] CHF (congestive heart failure) ([place]) [I50.9]   Discharge Diagnosis  Acute pulmonary edema ([place]) [J81.0] Respiratory distress [R06.03] Flash pulmonary edema (HCC) [J81.0] Acute respiratory failure, unspecified whether with hypoxia or hypercapnia (HCC) [J96.00] Acute renal failure superimposed on chronic kidney disease, on chronic dialysis, unspecified acute renal failure type ([place]) [N17.9, N18.9, Z99.2] CHF (congestive heart failure) ([place]) [I50.9]    Active Problems:   CHF (congestive heart failure) (HCC)   Protein-calorie malnutrition, severe      Past Medical History:  Diagnosis Date  . Anemia   . Anxiety   . Breast cancer ([place]) 01/2016   bilateral  . CHF (congestive heart failure) ([place])   . Chronic kidney disease   . Depression   . Dialysis patient ([place])   . DVT (deep venous thrombosis) (HCC)    left leg  . DVT (deep venous thrombosis) ([place]) 1985   right thigh  . Dysrhythmia   . Gout   . Headache   . HTN (hypertension)   . Hypertension   . Parathyroid  abnormality ([place])   . Parathyroid disease ([place])   . Pneumonia 12/2015  . Psoriasis   . Renal insufficiency   . Sickle cell trait ([place])    traits    Past Surgical History:  Procedure Laterality Date  . ABDOMINAL HYSTERECTOMY  1980  . APPENDECTOMY    . BREAST BIOPSY Left 10/28/2013   benign  . BREAST EXCISIONAL BIOPSY Left 2002   benign  . INSERTION OF DIALYSIS CATHETER  2014  . LIPOMA EXCISION N/A 01/23/2016   Procedure: EXCISION LIPOMA;  Surgeon: Hubbard Robinson, MD;  Location: [facility];  Service: General;  Laterality: N/A;  . MASTECTOMY W/ SENTINEL NODE BIOPSY Bilateral 01/23/2016   Procedure: bilateral MASTECTOMY WITH  bilateral SENTINEL LYMPH NODE BIOPSY possible left axillary node dissection forehead lipoma removal;  Surgeon: Hubbard Robinson, MD;  Location: [facility];  Service: General;  Laterality: Bilateral;  . PARTIAL HYSTERECTOMY    . PERIPHERAL VASCULAR CATHETERIZATION N/A 12/25/2015   Procedure: Dialysis/Perma Catheter Insertion;  Surgeon: Algernon Huxley, MD;  Location: [facility];  Service: Cardiovascular;  Laterality: N/A;  . PERIPHERAL VASCULAR CATHETERIZATION Left 01/22/2016   Procedure: Dialysis/Perma Catheter Insertion;  Surgeon: Algernon Huxley, MD;  Location: [facility];  Service: Cardiovascular;  Laterality: Left;  . PERIPHERAL VASCULAR CATHETERIZATION N/A 01/26/2016   Procedure: Dialysis/Perma Catheter Insertion;  Surgeon: Katha Cabal, MD;  Location: [facility];  Service: Cardiovascular;  Laterality: N/A;  . PORT-A-CATH REMOVAL N/A 12/20/2015   Procedure: REMOVAL PORT-A-CATH;  Surgeon: Hubbard Robinson, MD;  Location: [facility];  Service: General;  Laterality: N/A;  left    . PORTACATH PLACEMENT Left 08/21/2015   Procedure: INSERTION PORT-A-CATH;  Surgeon: Hubbard Robinson,  MD;  Location: [facility];  Service: General;  Laterality: Left;  . 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   Hospital Course  acute respiratory failure secondary with hypoxia and hypercapnia: Initially required BiPAP, received emergency hemodialysis, transition from BiPAP to nasal cannula, shortness of breath improved.  Patient transferred from ICU to regular floor.  #3/ acute on chronic heart failure.  Patient has acute on chronic combined systolic and diastolic heart failure.  Symptoms of shortness of breath improved  4.  ESRD on hemodialysis Monday, Wednesday, Friday.  Patient received emergent hemodialysis yesterday .  Transition to nasal cannula, transfer from ICU.  She is going to get another session of hemodialysis and after that she can go home.  5. migraine headaches.  Followed by Dr. Dreama Saa as as out pt.  #6 .chronic atrial fibrillation: Rate controlled, continue beta-blockers, anticoagulation #7 severe malnutrition in the context of chronic illness: Dietitian recommended Ensure. 7/. slightly elevated troponins up to 0.06 is due to demand ischemia.  Seen by cardiology.  History of breast cancer: Patient is on hormone therapy. Discharge Condition: Stable   Follow UP      Discharge Instructions  and  Discharge Medications      Allergies as of 01/14/2017      Reactions   Gabapentin Other (See Comments)   Seizure   Adhesive [tape] Itching   Silk tape is ok to use.      Medication List    STOP taking these medications   amoxicillin-clavulanate 500-125 MG  tablet Commonly known as:  AUGMENTIN   ondansetron 8 MG tablet Commonly known as:  ZOFRAN   UNABLE TO FIND     TAKE these medications   acetaminophen 325 MG tablet Commonly known as:  TYLENOL Take 2 tablets (650 mg total) by mouth every 6 (six) hours as needed for mild pain (or Fever >/= 101).   aspirin 81 MG EC tablet Take 1 tablet (81 mg total) by mouth daily.   b complex-vitamin c-folic acid 0.8 MG Tabs tablet Take 1 tablet by mouth daily.   cinacalcet 60 MG tablet Commonly known as:  SENSIPAR Take 60 mg by mouth daily.   feeding supplement (ENSURE ENLIVE) Liqd Take 237 mLs by mouth 3 (three) times daily between meals. What changed:  when to take this   hydrALAZINE 50 MG tablet Commonly known as:  APRESOLINE Take 1 tablet (50 mg total) by mouth every 8 (eight) hours.   HYDROcodone-acetaminophen 5-325 MG tablet Commonly known as:  NORCO/VICODIN Take 1 tablet by mouth every 4 (four) hours as needed for moderate pain.   isosorbide mononitrate 30 MG 24 hr tablet Commonly known as:  IMDUR Take 1 tablet (30 mg total) by mouth daily.   letrozole 2.5 MG tablet Commonly known as:  FEMARA Take 1 tablet (2.5 mg total) by mouth daily.   losartan 50 MG tablet Commonly known as:  COZAAR Take 3 tablets (150  mg total) by mouth daily.   metoprolol succinate 100 MG 24 hr tablet Commonly known as:  TOPROL-XL Take 1 tablet (100 mg total) by mouth daily. Take with or immediately following a meal.   pantoprazole 40 MG tablet Commonly known as:  PROTONIX Take 1 tablet (40 mg total) by mouth daily.   prochlorperazine 10 MG tablet Commonly known as:  COMPAZINE Take 10 mg by mouth every 6 (six) hours as needed for nausea or vomiting.   sertraline 50 MG tablet Commonly known as:  ZOLOFT Take 3 tablets (150 mg total) by mouth daily. What changed:  how much to take   sevelamer carbonate 800 MG tablet Commonly known as:  RENVELA Take 2,400 mg by mouth 3 (three) times daily  with meals. Tid with meals and 1 tablet with snacks once a day   traMADol 50 MG tablet Commonly known as:  ULTRAM Take 1 tablet (50 mg total) by mouth every 6 (six) hours as needed for moderate pain.   traZODone 50 MG tablet Commonly known as:  DESYREL Take 50 mg by mouth at bedtime. May increase to 2 tablets if needed.   warfarin 5 MG tablet Commonly known as:  COUMADIN take 5 mg on friday, saturday, sunday. and 7.5 mg on monday, tuesday, wednesday, thursday What changed:  Another medication with the same name was removed. Continue taking this medication, and follow the directions you see here.   zolpidem 5 MG tablet Commonly known as:  AMBIEN Take 1 tablet (5 mg total) by mouth at bedtime. What changed:  how much to take         Diet and Activity recommendation: See Discharge Instructions above   Consults obtained -cardiologist,nephrology, intensivist  Major procedures and Radiology Reports - PLEASE review detailed and final reports for all details, in brief -     Dg Chest Port 1 View  Result Date: 01/13/2017 CLINICAL DATA:  Acute respiratory distress. EXAM: PORTABLE CHEST 1 VIEW COMPARISON:  Radiograph 09/09/2016, 08/21/2016 FINDINGS: Left-sided dialysis catheter in place. Patchy right perihilar opacities, not as prominent than on prior exam. Slight decrease in cardiomegaly. Unchanged mediastinal contours with aortic atherosclerosis. Suspect small right pleural effusion. No pneumothorax. IMPRESSION: Patchy right perihilar opacities are improved from prior exam. Similar findings also seen on 08/21/2016. While findings may be due to asymmetric pulmonary edema, multifocal pneumonia, or aspiration, chronic lung opacities are also considered given the persistence since 08/21/2016. Consider chest CT characterization. Electronically Signed   By: Jeb Levering M.D.   On: 01/13/2017 00:53    Micro Results    Recent Results (from the past 240 hour(s))  MRSA PCR Screening      Status: None   Collection Time: 01/13/17  3:48 AM  Result Value Ref Range Status   MRSA by PCR NEGATIVE NEGATIVE Final    Comment:        The GeneXpert MRSA Assay (FDA approved for NASAL specimens only), is one component of a comprehensive MRSA colonization surveillance program. It is not intended to diagnose MRSA infection nor to guide or monitor treatment for MRSA infections. Performed at [facility], [address] [place], [place] [address]        Today   Subjective:   Lavita Pontius today has no headache,no chest abdominal pain,no new weakness tingling or numbness, feels much better wants to go home today.   Objective:   Blood pressure 130/82, pulse 72, temperature 98.6 F (37 C), temperature source Oral, resp. rate 20, height 5'  5" (1.651 m), weight 53.4 kg (117 lb 11.6 oz), SpO2 93 %.   Intake/Output Summary (Last 24 hours) at 01/14/2017 1023 Last data filed at 01/14/2017 0720 Gross per 24 hour  Intake 183 ml  Output 1957 ml  Net -1774 ml    Exam Awake Alert, Oriented x 3, No new F.N deficits, Normal affect [place].AT,PERRAL Supple Neck,No JVD, No cervical lymphadenopathy appriciated.  Symmetrical Chest wall movement, Good air movement bilaterally, CTAB RRR,No Gallops,Rubs or new Murmurs, No Parasternal Heave +ve B.Sounds, Abd Soft, Non tender, No organomegaly appriciated, No rebound -guarding or rigidity. No Cyanosis, Clubbing or edema, No new Rash or bruise  Data Review   CBC w Diff:  Lab Results  Component Value Date   WBC 8.2 01/13/2017   HGB 10.9 (L) 01/13/2017   HGB 11.7 (L) 02/03/2014   HCT 32.9 (L) 01/13/2017   HCT 35.9 02/03/2014   PLT 208 01/13/2017   PLT 205 02/03/2014   LYMPHOPCT 25 01/12/2017   LYMPHOPCT 20.1 02/03/2014   BANDSPCT 0 09/22/2015   MONOPCT 5 01/12/2017   MONOPCT 8.4 02/03/2014   EOSPCT 2 01/12/2017   EOSPCT 1.3 02/03/2014   BASOPCT 1 01/12/2017   BASOPCT 0.3 02/03/2014    CMP:  Lab Results  Component  Value Date   NA 137 01/14/2017   NA 138 02/03/2014   K 4.8 01/14/2017   K 4.0 02/03/2014   CL 97 (L) 01/14/2017   CL 102 02/03/2014   CO2 30 01/14/2017   CO2 23 02/03/2014   BUN 41 (H) 01/14/2017   BUN 21 (H) 02/03/2014   CREATININE 6.51 (H) 01/14/2017   CREATININE 5.11 (H) 02/03/2014   PROT 6.2 (L) 01/13/2017   PROT 7.4 02/02/2014   ALBUMIN 3.2 (L) 01/13/2017   ALBUMIN 3.5 02/02/2014   BILITOT 0.4 01/13/2017   BILITOT 0.3 02/02/2014   ALKPHOS 95 01/13/2017   ALKPHOS 185 (H) 02/02/2014   AST 24 01/13/2017   AST 22 02/02/2014   ALT 27 01/13/2017   ALT 20 02/02/2014  .   Total Time in preparing paper work, data evaluation and todays exam - 35 minutes  Epifanio Lesches M.D on 01/14/2017 at 10:23 AM    Note: This dictation was prepared with Dragon dictation along with smaller phrase technology. Any transcriptional errors that result from this process are unintentional.

## 2017-01-14 NOTE — Progress Notes (Signed)
HD initiated via L Chest HD catheter without issue. Access without s/sx of infection. Patient currently denies complaints. Dr. Juleen China at bedside. Order to do Sequential HD treatment for fluid removal today. Continue to monitor.

## 2017-01-14 NOTE — Progress Notes (Signed)
Pre hd 

## 2017-01-14 NOTE — Progress Notes (Signed)
BP has decreased to within normal range will formally discharge patient at this time.

## 2017-01-14 NOTE — Progress Notes (Signed)
MD spoke with patient and she is to sign out AMA as BP is still elevated. Per MD and administrative coordinator will give prescriptions.

## 2017-02-08 NOTE — Progress Notes (Signed)
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  INTERVAL HISTORY: Patient returns to clinic today for routine 6 month evaluation. She continues to decline to initiate letrozole. She currently feels well and is asymptomatic. She continues to receive dialysis on Mondays, Wednesdays, and Fridays. She has no neurologic complaints. She denies any recent fevers. She has a good appetite and denies weight loss. She has no chest pain or shortness of breath. She denies any nausea, vomiting, constipation, or diarrhea. Patient offers no specific complaints today.  REVIEW OF SYSTEMS:   Review of Systems  Constitutional: Negative for fever, malaise/fatigue and weight loss.  Respiratory: Negative.  Negative for cough and shortness of breath.   Cardiovascular: Negative.  Negative for chest pain and leg swelling.  Gastrointestinal: Negative.  Negative for abdominal pain, blood in stool and melena.  Musculoskeletal: Negative.   Skin: Negative.  Negative for rash.  Neurological: Negative.  Negative for sensory change and weakness.  Psychiatric/Behavioral: Negative.  The patient is not nervous/anxious.     As per HPI. Otherwise, a complete review of systems is negative.  PAST MEDICAL HISTORY: Past Medical History:  Diagnosis Date  . Anemia   . Anxiety   . Breast cancer ([place]) 01/2016   bilateral  . CHF (congestive heart failure) ([place])   . Chronic kidney disease   . Depression   . Dialysis patient ([place])   . DVT (deep venous thrombosis) (H[facility])    left leg  .  DVT (deep venous thrombosis) ([place]) 1985   right thigh  . Dysrhythmia   . Gout   . Headache   . HTN (hypertension)   . Hypertension   . Parathyroid abnormality ([place])   . Parathyroid disease ([place])   . Pneumonia 12/2015  . Psoriasis   . Renal insufficiency   . Sickle cell trait ([place])    traits    PAST SURGICAL HISTORY: Past Surgical History:  Procedure Laterality Date  . ABDOMINAL HYSTERECTOMY  1980  . APPENDECTOMY    . BREAST BIOPSY Left 10/28/2013   benign  . BREAST EXCISIONAL BIOPSY Left 2002   benign  . INSERTION OF DIALYSIS CATHETER  2014  . LIPOMA EXCISION N/A 01/23/2016   Procedure: EXCISION LIPOMA;  Surgeon: Hubbard Robinson, MD;  Location: [facility];  Service: General;  Laterality: N/A;  . MASTECTOMY W/ SENTINEL NODE BIOPSY Bilateral 01/23/2016   Procedure: bilateral MASTECTOMY WITH  bilateral SENTINEL LYMPH NODE BIOPSY possible left axillary node dissection forehead lipoma removal;  Surgeon: Hubbard Robinson, MD;  Location: [facility];  Service: General;  Laterality: Bilateral;  . PARTIAL HYSTERECTOMY    . PERIPHERAL VASCULAR CATHETERIZATION N/A 12/25/2015   Procedure: Dialysis/Perma Catheter Insertion;  Surgeon: Algernon Huxley, MD;  Location: [facility];  Service: Cardiovascular;  Laterality: N/A;  . PERIPHERAL VASCULAR CATHETERIZATION Left 01/22/2016   Procedure: Dialysis/Perma Catheter Insertion;  Surgeon: Algernon Huxley, MD;  Location: [facility];  Service: Cardiovascular;  Laterality: Left;  . PERIPHERAL VASCULAR CATHETERIZATION N/A 01/26/2016   Procedure: Dialysis/Perma Catheter Insertion;  Surgeon: Katha Cabal, MD;  Location: [facility];  Service: Cardiovascular;  Laterality: N/A;  . PORT-A-CATH REMOVAL N/A 12/20/2015   Procedure: REMOVAL PORT-A-CATH;  Surgeon: Hubbard Robinson, MD;  Location: [facility];  Service: General;  Laterality: N/A;  left    . PORTACATH PLACEMENT Left 08/21/2015   Procedure: INSERTION PORT-A-CATH;  Surgeon:  Hubbard Robinson, MD;  Location: [facility];  Service: General;  Laterality: Left;  . REMOVAL OF A DIALYSIS CATHETER  2017    FAMILY HISTORY: Family History  Problem Relation Age of Onset  . Stroke Mother   . CVA Mother   . Hypertension Mother   . Hypertension Father   . Hypertension Sister   . Diabetes Sister   . Cancer Sister 68       Breast  . Stroke Brother   . Hypertension Brother   . Breast cancer Maternal Aunt 30       ADVANCED DIRECTIVES:    HEALTH MAINTENANCE: Social History   Tobacco Use  . Smoking status: Never Smoker  . Smokeless tobacco: Never Used  Substance Use Topics  . Alcohol use: No    Alcohol/week: 0.0 oz  . Drug use: No     Colonoscopy:  PAP:  Bone density:  Lipid panel:  Allergies  Allergen Reactions  . Gabapentin Other (See Comments)    Seizure  . Adhesive [Tape] Itching    Silk tape is ok to use.    Current Outpatient Medications  Medication Sig Dispense Refill  . b complex-vitamin c-folic acid (NEPHRO-VITE) 0.8 MG TABS tablet Take 1 tablet by mouth daily.    . cinacalcet (SENSIPAR) 60 MG tablet Take 60 mg by mouth daily.    . feeding supplement, ENSURE ENLIVE, (ENSURE ENLIVE) LIQD Take 237 mLs by mouth 3 (three) times daily between meals. (Patient taking differently: Take 237 mLs by mouth daily. ) 237 mL 12  . hydrALAZINE (APRESOLINE) 50 MG tablet Take 1 tablet (50 mg total) by mouth every 8 (eight) hours. 90 tablet 0  . HYDROcodone-acetaminophen (NORCO/VICODIN) 5-325 MG tablet Take 1 tablet by mouth every 4 (four) hours as needed for moderate pain. 10 tablet 0  . isosorbide mononitrate (IMDUR) 30 MG 24 hr tablet Take 1 tablet (30 mg total) by mouth daily. 30 tablet 0  . losartan (COZAAR) 50 MG tablet Take 3 tablets (150 mg total) by mouth daily. 30 tablet 0  . metoprolol succinate (TOPROL-XL) 100 MG 24 hr tablet Take 1 tablet (100 mg total) by mouth daily. Take with or immediately following a meal. 30 tablet 0  . pantoprazole  (PROTONIX) 40 MG tablet Take 1 tablet (40 mg total) by mouth daily. 30 tablet 0  . sertraline (ZOLOFT) 50 MG tablet Take 3 tablets (150 mg total) by mouth daily. (Patient taking differently: Take 200 mg by mouth daily. ) 90 tablet 0  . sevelamer carbonate (RENVELA) 800 MG tablet Take 2,400 mg by mouth 3 (three) times daily with meals. Tid with meals and 1 tablet with snacks once a day    . traZODone (DESYREL) 50 MG tablet Take 50 mg by mouth at bedtime. May increase to 2 tablets if needed.    . warfarin (COUMADIN) 5 MG tablet take 5 mg on friday, saturday, sunday. and 7.5 mg on monday, tuesday, wednesday, thursday    . zolpidem (AMBIEN) 5 MG tablet Take 1 tablet (5 mg total) by mouth at bedtime. (Patient taking differently: Take 10 mg by mouth at bedtime. ) 30 tablet 0   No current facility-administered medications for this visit.  OBJECTIVE: Vitals:   02/11/17 1428  BP: (!) 167/116  Pulse: 62     Body mass index is 19.19 kg/m.    ECOG FS:0 - Asymptomatic  General: Well-developed, well-nourished, no acute distress. Eyes: Pink conjunctiva, anicteric sclera. Breasts: Bilateral mastectomy. Lungs: Clear to auscultation bilaterally. Chest wall: Dialysis catheter in right chest wall. Heart: Regular rate and rhythm. No rubs, murmurs, or gallops. Abdomen: Soft, nontender, nondistended. No organomegaly noted, normoactive bowel sounds. Musculoskeletal: No edema, cyanosis, or clubbing. Neuro: Alert, answering all questions appropriately. Cranial nerves grossly intact. Skin: No rashes or petechiae noted. Psych: Normal affect.   LAB RESULTS:  Lab Results  Component Value Date   NA 137 01/14/2017   K 4.8 01/14/2017   CL 97 (L) 01/14/2017   CO2 30 01/14/2017   GLUCOSE 99 01/14/2017   BUN 41 (H) 01/14/2017   CREATININE 6.51 (H) 01/14/2017   CALCIUM 9.2 01/14/2017   PROT 6.2 (L) 01/13/2017   ALBUMIN 3.2 (L) 01/13/2017   AST 24 01/13/2017   ALT 27 01/13/2017   ALKPHOS 95 01/13/2017    BILITOT 0.4 01/13/2017   GFRNONAA 6 (L) 01/14/2017   GFRAA 7 (L) 01/14/2017    Lab Results  Component Value Date   WBC 8.2 01/14/2017   NEUTROABS 7.8 (H) 01/12/2017   HGB 11.3 (L) 01/14/2017   HCT 33.8 (L) 01/14/2017   MCV 88.9 01/14/2017   PLT 225 01/14/2017     STUDIES: No results found.  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  PLAN:    1. 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 Her surgery was delayed secondary to multiple hospital admissions, but she ultimately decided to proceed with a double mastectomy, which was done on January 23, 2016. Because of this, she did not require XRT. Patient continues to refuse adjuvant therapy with letrozole despite acknowledging the risks of increased incidence of recurrence.  After lengthy discussion with the patient, she also has declined any further follow-up.  No follow-up has been scheduled.  Please refer patient back if there are any questions or concerns.   2. End-stage renal disease: Continue dialysis on Monday, Wednesday, and Fridays. 3. Anemia: Likely multifactorial. Monitor. 4. Osteopenia: Patient's bone mineral density on May 28, 2016 revealed a T score of -2.0. Have recommended patient take calcium and vitamin D supplementation.  5. Hypertension: Improved, monitor.   Approximately 30 minutes was spent in discussion of which greater than 50% was consultation.   Patient expressed understanding and was in agreement with this plan. She also understands that She can call clinic at any time with any questions, concerns, or complaints.   Cancer Staging Malignant neoplasm of upper-inner quadrant of left breast in female, estrogen receptor positive ([place]) Staging form: Breast, AJCC 7th  Edition - Pathologic stage from 02/05/2016: Stage IIA (T1b, N1a, cM0) - Signed by [name], MD on 02/05/2016   [name], MD   02/14/2017 6:57 PM

## 2017-02-11 ENCOUNTER — Inpatient Hospital Stay: Payer: Medicare Other | Attending: Oncology | Admitting: Oncology

## 2017-02-11 ENCOUNTER — Encounter: Payer: Self-pay | Admitting: Oncology

## 2017-02-11 ENCOUNTER — Other Ambulatory Visit: Payer: Self-pay

## 2017-02-11 VITALS — BP 167/116 | HR 62 | Wt 115.3 lb

## 2017-02-11 DIAGNOSIS — Z992 Dependence on renal dialysis: Secondary | ICD-10-CM | POA: Diagnosis not present

## 2017-02-11 DIAGNOSIS — C50212 Malignant neoplasm of upper-inner quadrant of left female breast: Secondary | ICD-10-CM

## 2017-02-11 DIAGNOSIS — N186 End stage renal disease: Secondary | ICD-10-CM

## 2017-02-11 DIAGNOSIS — D649 Anemia, unspecified: Secondary | ICD-10-CM | POA: Diagnosis not present

## 2017-02-11 DIAGNOSIS — I132 Hypertensive heart and chronic kidney disease with heart failure and with stage 5 chronic kidney disease, or end stage renal disease: Secondary | ICD-10-CM | POA: Diagnosis not present

## 2017-02-11 DIAGNOSIS — Z853 Personal history of malignant neoplasm of breast: Secondary | ICD-10-CM | POA: Insufficient documentation

## 2017-02-11 DIAGNOSIS — M8580 Other specified disorders of bone density and structure, unspecified site: Secondary | ICD-10-CM | POA: Diagnosis not present

## 2017-02-11 DIAGNOSIS — Z170 Estrogen receptor positive status [ER+]: Secondary | ICD-10-CM

## 2017-02-13 ENCOUNTER — Ambulatory Visit: Payer: Medicare Other | Admitting: Oncology

## 2017-05-11 IMAGING — CT CT ABD-PELV W/ CM
2 of 5 series · 15 of 46 positions shown, 17 images · IV contrast (APPLIED)
Comparison: None.

CLINICAL DATA: Abdominal pain and swelling.

EXAM:
CT ABDOMEN AND PELVIS WITH CONTRAST
TECHNIQUE: Multidetector CT imaging of the abdomen and pelvis was performed
using the standard protocol following bolus administration of
intravenous contrast.
CONTRAST:  100mL 2KSSRB-3BB IOPAMIDOL (2KSSRB-3BB) INJECTION 61%

[Series 2: routine abd/pel with · axial · 0.68mm/px · z∈[-1016,-666]mm · 12 of 80 slices shown, 14 images]
[im 5/80  soft-tissue]
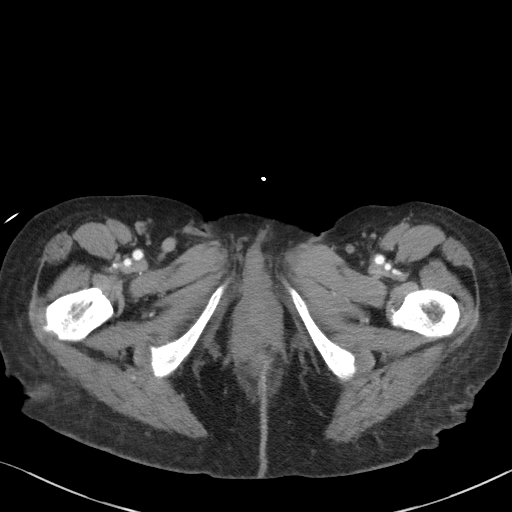
[im 5/80  bone]
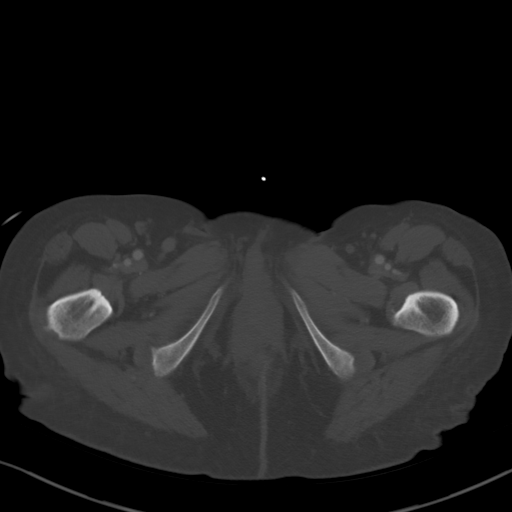
[im 13/80  soft-tissue]
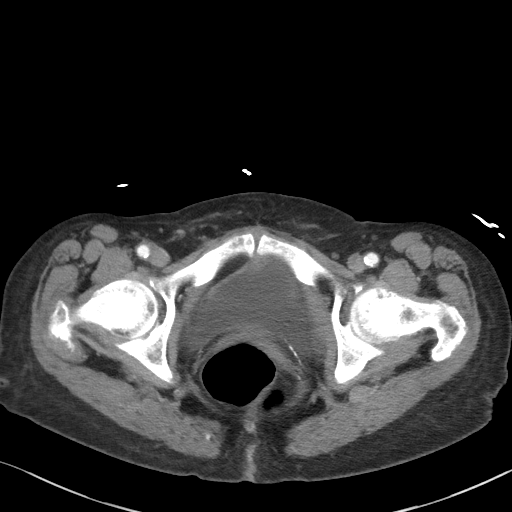
[im 17/80  soft-tissue]
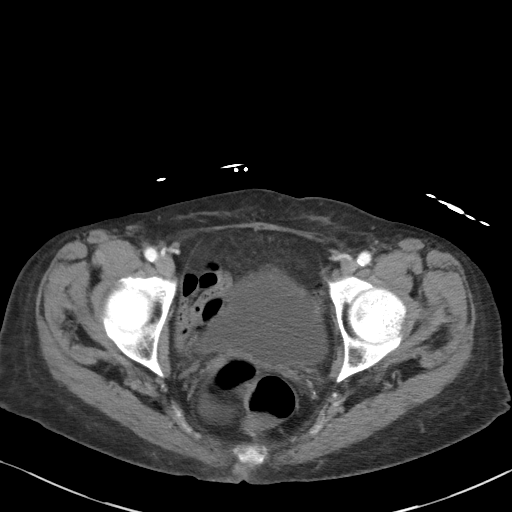
[im 25/80  soft-tissue]
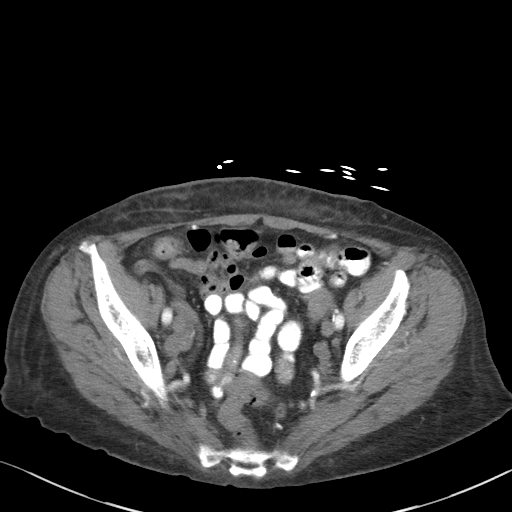
[im 30/80  soft-tissue]
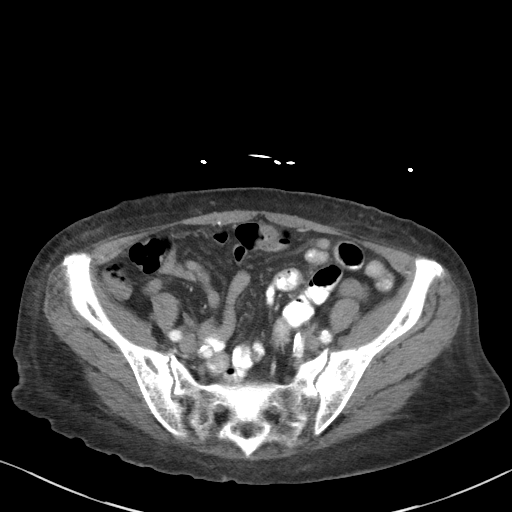
[im 38/80  soft-tissue]
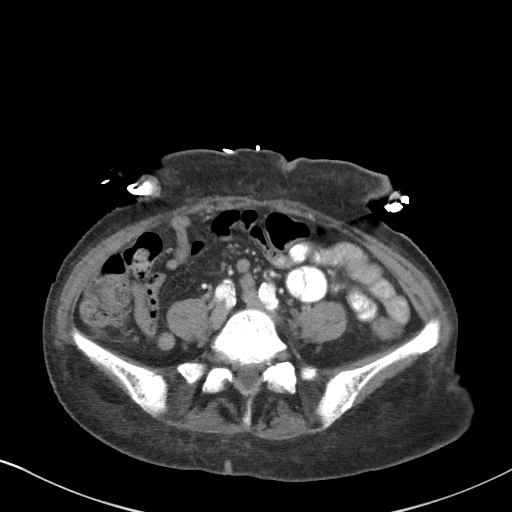
[im 42/80  soft-tissue]
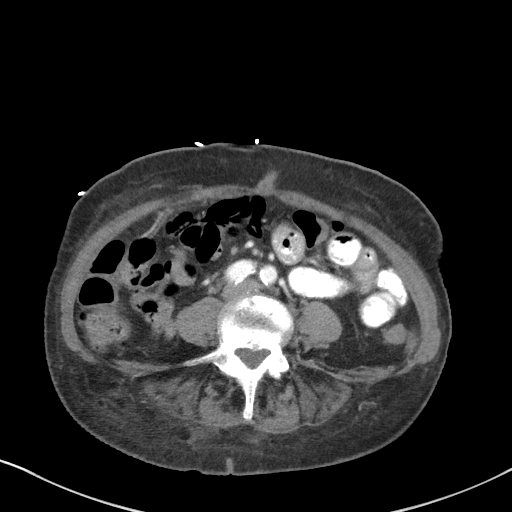
[im 50/80  soft-tissue]
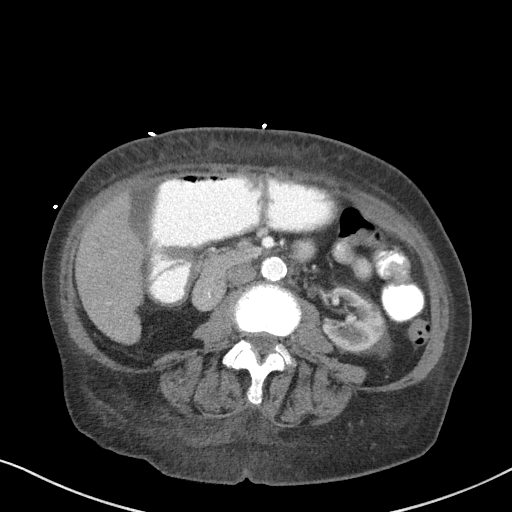
[im 55/80  soft-tissue]
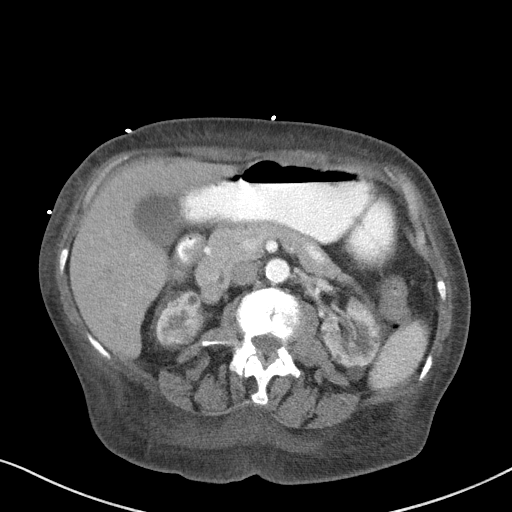
[im 55/80  bone]
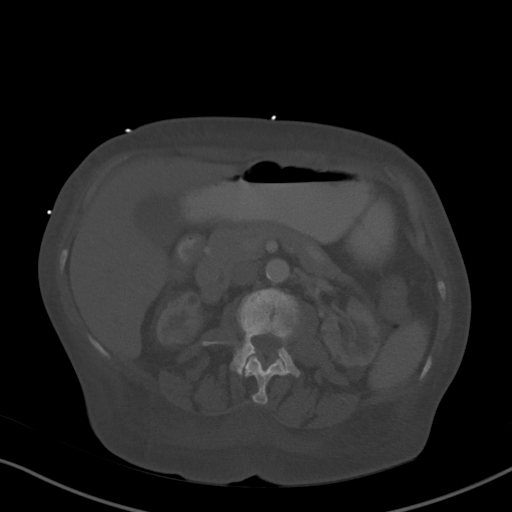
[im 63/80  soft-tissue]
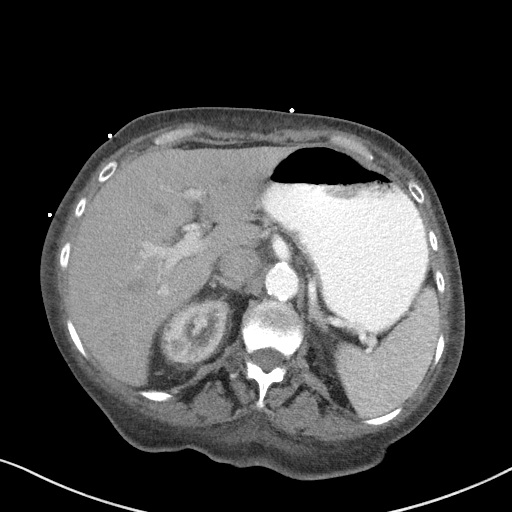
[im 67/80  soft-tissue]
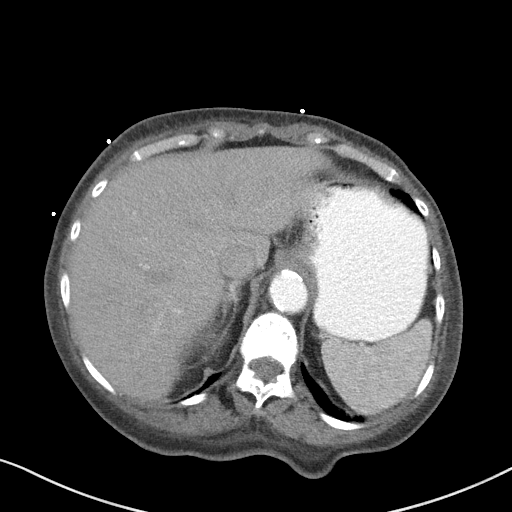
[im 75/80  soft-tissue]
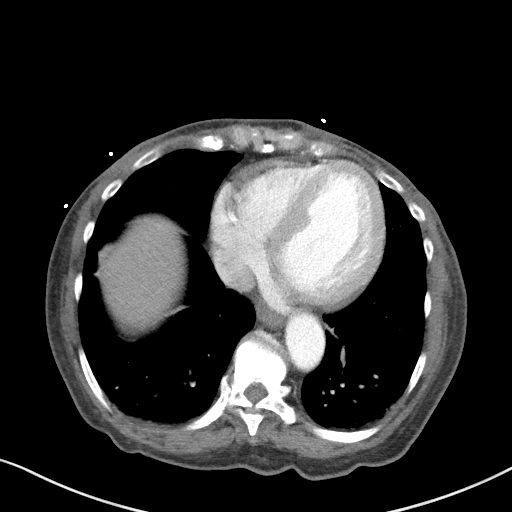

[Series 5: coronal st · coronal · 0.61mm/px · 3 of 87 slices shown]
[im 29/87  soft-tissue]
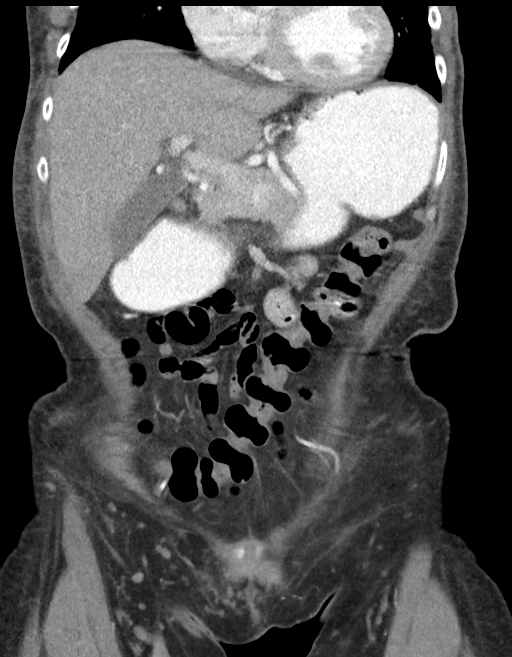
[im 39/87  soft-tissue]
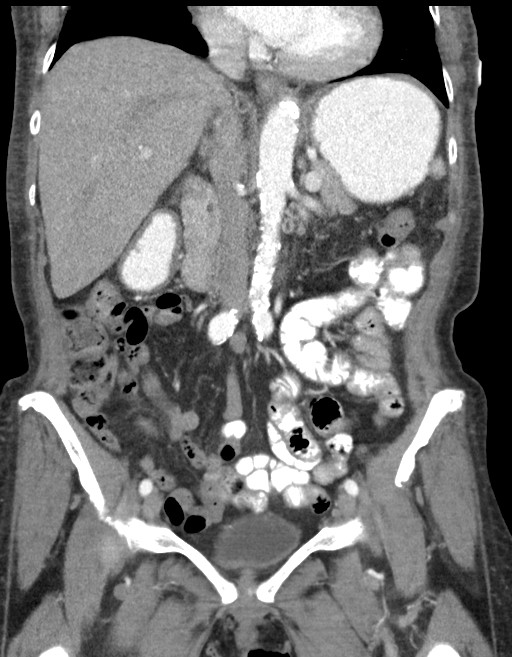
[im 48/87  soft-tissue]
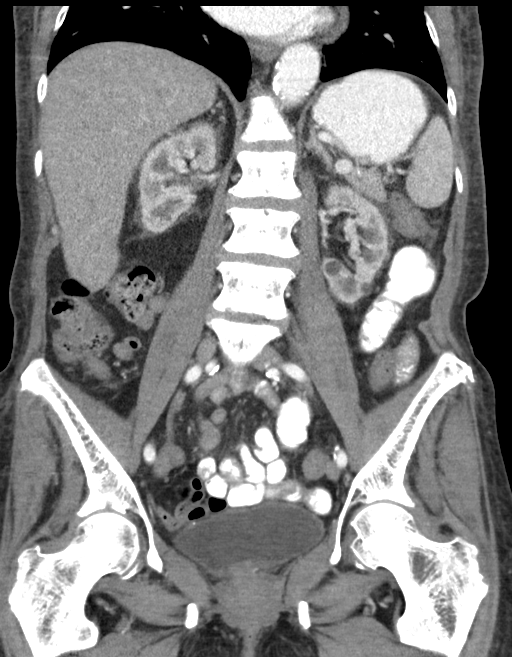

[15 of 46 positions shown; findings below may reference images not displayed]

FINDINGS: Lower chest: Heart is enlarged. Trace pericardial effusion
visualized inferiorly. Compressive atelectasis posterior left lower
lobe.

Hepatobiliary: No focal abnormality within the liver parenchyma.
There is no evidence for gallstones, gallbladder wall thickening, or
pericholecystic fluid. No intrahepatic or extrahepatic biliary
dilation.

Pancreas: No focal mass lesion. No dilatation of the main duct. No
intraparenchymal cyst. No peripancreatic edema.

Spleen: No splenomegaly. No focal mass lesion.

Adrenals/Urinary Tract: No adrenal nodule or mass. Both kidneys are
markedly atrophic. Tiny nonobstructing stone interpolar right
kidney. Scattered tiny hypoattenuating lesions in the kidneys
bilaterally are likely cysts. Many of these are too small to
definitively characterize. No evidence for hydroureter. The urinary
bladder appears normal for the degree of distention.

Stomach/Bowel: Stomach is distended. Duodenum is normally positioned
as is the ligament of Treitz. No small bowel wall thickening. No
small bowel dilatation. The terminal ileum is normal.
Nonvisualization of the appendix is consistent with the reported
history of appendectomy. No gross colonic mass. No colonic wall
thickening. No substantial diverticular change.

Vascular/Lymphatic: There is abdominal aortic atherosclerosis
without aneurysm. There is no gastrohepatic or hepatoduodenal
ligament lymphadenopathy. No intraperitoneal or retroperitoneal
lymphadenopathy. No pelvic sidewall lymphadenopathy.

Reproductive: Uterus surgically absent.  There is no adnexal mass.

Other: Trace amount a intraperitoneal free fluid is identified in
the cul-de-sac. 4 mm nodular focus along the posterior peritoneal
lining of the cul-de-sac is nonspecific. There is: Adjacent in this
could represent an diverticulum.

Musculoskeletal: Diffusely heterogeneous bony mineralization may be
related to the history of chronic renal insufficiency. Diffuse body
wall edema is evident.
IMPRESSION: 1. No acute findings in the abdomen or pelvis to explain the
patient's history of abdominal pain and swelling.
2. Trace intraperitoneal free fluid identified in the cul-de-sac
with no identifiable a etiology by CT.
3. Atrophy of the kidneys bilaterally compatible the history of
chronic renal disease. Numerous hypoattenuating renal lesions
bilaterally are too small to characterize but are likely cysts.
4.  Abdominal Aortic Atherosclerois ([id number]-170.0)

## 2017-07-25 ENCOUNTER — Other Ambulatory Visit
Admission: RE | Admit: 2017-07-25 | Discharge: 2017-07-25 | Disposition: A | Discharge status: Home / Self Care | Payer: Medicare Other | Source: Ambulatory Visit | Attending: Internal Medicine | Admitting: Internal Medicine

## 2017-07-25 DIAGNOSIS — Z7901 Long term (current) use of anticoagulants: Secondary | ICD-10-CM | POA: Diagnosis present

## 2017-07-25 DIAGNOSIS — N186 End stage renal disease: Secondary | ICD-10-CM | POA: Diagnosis present

## 2017-07-25 LAB — PROTIME-INR
INR: 1.81
PROTHROMBIN TIME: 20.8 s — AB (ref 11.4–15.2)

## 2017-07-29 ENCOUNTER — Ambulatory Visit: Payer: Medicare Other | Admitting: Gastroenterology

## 2017-07-29 NOTE — Progress Notes (Deleted)
She has a history of breast cancer and sees Dr Grayland Ormond. On dialysis. She has been referred for dysphagia.   S**he has been referred for difficulty with swallowing   Dysphagia: Onset and any progression: *** Frequency: *** Foods affected : *** Prior episodes of impaction: *** History of asthma/allergy : *** History of heartburn/Reflux : *** Weight loss/weight gain : ***  Prior EGD: *** PPI/H2 blocker use : ***    dysphagia. Her history is suggestive of ***  Plan  1. INR 1.81 - on coumadin

## 2017-07-31 ENCOUNTER — Ambulatory Visit: Payer: Medicare Other | Admitting: Gastroenterology

## 2017-07-31 ENCOUNTER — Encounter: Payer: Self-pay | Admitting: Gastroenterology

## 2017-07-31 NOTE — Progress Notes (Deleted)
She has a prior history of adenocarcinoma of the left breast.  She follows with Dr. Grayland Ormond in oncology.  She has been referred by Dr. Candiss Norse for dysphagia.  I do note that she has been seen by [facility] back in 2015 for dysphagia.  S**he has been referred for difficulty with swallowing   Dysphagia: Onset and any progression: *** Frequency: *** Foods affected : *** Prior episodes of impaction: *** History of asthma/allergy : *** History of heartburn/Reflux : *** Weight loss/weight gain : ***  Prior EGD: *** PPI/H2 blocker use : ***    dysphagia. Her history is suggestive of ***  Plan  1.

## 2017-08-22 ENCOUNTER — Other Ambulatory Visit: Payer: Self-pay | Admitting: Otolaryngology

## 2017-08-22 DIAGNOSIS — K1120 Sialoadenitis, unspecified: Secondary | ICD-10-CM

## 2017-08-29 ENCOUNTER — Ambulatory Visit
Admission: RE | Admit: 2017-08-29 | Discharge: 2017-08-29 | Disposition: A | Discharge status: Home / Self Care | Payer: Medicare Other | Source: Ambulatory Visit | Attending: Otolaryngology | Admitting: Otolaryngology

## 2017-08-29 DIAGNOSIS — K1120 Sialoadenitis, unspecified: Secondary | ICD-10-CM

## 2017-09-02 ENCOUNTER — Ambulatory Visit: Admission: RE | Admit: 2017-09-02 | Payer: Medicare Other | Source: Ambulatory Visit

## 2017-11-15 ENCOUNTER — Other Ambulatory Visit: Payer: Self-pay | Admitting: Nurse Practitioner

## 2018-07-24 ENCOUNTER — Other Ambulatory Visit
Admission: RE | Admit: 2018-07-24 | Discharge: 2018-07-24 | Disposition: A | Discharge status: Home / Self Care | Payer: Medicare Other | Source: Ambulatory Visit | Attending: Internal Medicine | Admitting: Internal Medicine

## 2018-07-24 DIAGNOSIS — Z7901 Long term (current) use of anticoagulants: Secondary | ICD-10-CM | POA: Diagnosis present

## 2018-07-24 DIAGNOSIS — N186 End stage renal disease: Secondary | ICD-10-CM | POA: Diagnosis not present

## 2018-07-24 LAB — PROTIME-INR
INR: 4.7 (ref 0.8–1.2)
Prothrombin Time: 43.8 seconds — ABNORMAL HIGH (ref 11.4–15.2)

## 2020-01-22 ENCOUNTER — Emergency Department: Payer: Medicare Other

## 2020-01-22 ENCOUNTER — Other Ambulatory Visit: Payer: Self-pay

## 2020-01-22 ENCOUNTER — Inpatient Hospital Stay
Admission: EM | Admit: 2020-01-22 | Discharge: 2020-01-26 | DRG: 291 | Disposition: A | Discharge status: Home / Self Care | Payer: Medicare Other | Attending: Family Medicine | Admitting: Family Medicine

## 2020-01-22 DIAGNOSIS — Z90711 Acquired absence of uterus with remaining cervical stump: Secondary | ICD-10-CM

## 2020-01-22 DIAGNOSIS — Z992 Dependence on renal dialysis: Secondary | ICD-10-CM

## 2020-01-22 DIAGNOSIS — R0902 Hypoxemia: Secondary | ICD-10-CM

## 2020-01-22 DIAGNOSIS — I5023 Acute on chronic systolic (congestive) heart failure: Secondary | ICD-10-CM | POA: Diagnosis not present

## 2020-01-22 DIAGNOSIS — J449 Chronic obstructive pulmonary disease, unspecified: Secondary | ICD-10-CM | POA: Diagnosis present

## 2020-01-22 DIAGNOSIS — D573 Sickle-cell trait: Secondary | ICD-10-CM | POA: Diagnosis present

## 2020-01-22 DIAGNOSIS — D631 Anemia in chronic kidney disease: Secondary | ICD-10-CM | POA: Diagnosis present

## 2020-01-22 DIAGNOSIS — I132 Hypertensive heart and chronic kidney disease with heart failure and with stage 5 chronic kidney disease, or end stage renal disease: Principal | ICD-10-CM | POA: Diagnosis present

## 2020-01-22 DIAGNOSIS — J9601 Acute respiratory failure with hypoxia: Secondary | ICD-10-CM

## 2020-01-22 DIAGNOSIS — D72828 Other elevated white blood cell count: Secondary | ICD-10-CM | POA: Diagnosis present

## 2020-01-22 DIAGNOSIS — Z853 Personal history of malignant neoplasm of breast: Secondary | ICD-10-CM

## 2020-01-22 DIAGNOSIS — F419 Anxiety disorder, unspecified: Secondary | ICD-10-CM | POA: Diagnosis present

## 2020-01-22 DIAGNOSIS — K219 Gastro-esophageal reflux disease without esophagitis: Secondary | ICD-10-CM | POA: Diagnosis present

## 2020-01-22 DIAGNOSIS — I429 Cardiomyopathy, unspecified: Secondary | ICD-10-CM | POA: Diagnosis present

## 2020-01-22 DIAGNOSIS — Z20822 Contact with and (suspected) exposure to covid-19: Secondary | ICD-10-CM | POA: Diagnosis present

## 2020-01-22 DIAGNOSIS — Z803 Family history of malignant neoplasm of breast: Secondary | ICD-10-CM

## 2020-01-22 DIAGNOSIS — N2581 Secondary hyperparathyroidism of renal origin: Secondary | ICD-10-CM | POA: Diagnosis present

## 2020-01-22 DIAGNOSIS — Z79899 Other long term (current) drug therapy: Secondary | ICD-10-CM

## 2020-01-22 DIAGNOSIS — Z86718 Personal history of other venous thrombosis and embolism: Secondary | ICD-10-CM

## 2020-01-22 DIAGNOSIS — E785 Hyperlipidemia, unspecified: Secondary | ICD-10-CM | POA: Diagnosis present

## 2020-01-22 DIAGNOSIS — Z7901 Long term (current) use of anticoagulants: Secondary | ICD-10-CM

## 2020-01-22 DIAGNOSIS — F32A Depression, unspecified: Secondary | ICD-10-CM | POA: Diagnosis present

## 2020-01-22 DIAGNOSIS — I509 Heart failure, unspecified: Secondary | ICD-10-CM

## 2020-01-22 DIAGNOSIS — I160 Hypertensive urgency: Secondary | ICD-10-CM | POA: Diagnosis not present

## 2020-01-22 DIAGNOSIS — Z9013 Acquired absence of bilateral breasts and nipples: Secondary | ICD-10-CM

## 2020-01-22 DIAGNOSIS — M109 Gout, unspecified: Secondary | ICD-10-CM | POA: Diagnosis present

## 2020-01-22 DIAGNOSIS — N186 End stage renal disease: Secondary | ICD-10-CM | POA: Diagnosis present

## 2020-01-22 DIAGNOSIS — I4891 Unspecified atrial fibrillation: Secondary | ICD-10-CM | POA: Diagnosis present

## 2020-01-22 LAB — COMPREHENSIVE METABOLIC PANEL
ALT: 27 U/L (ref 0–44)
AST: 31 U/L (ref 15–41)
Albumin: 4 g/dL (ref 3.5–5.0)
Alkaline Phosphatase: 113 U/L (ref 38–126)
Anion gap: 17 — ABNORMAL HIGH (ref 5–15)
BUN: 47 mg/dL — ABNORMAL HIGH (ref 8–23)
CO2: 22 mmol/L (ref 22–32)
Calcium: 9.1 mg/dL (ref 8.9–10.3)
Chloride: 102 mmol/L (ref 98–111)
Creatinine, Ser: 8.13 mg/dL — ABNORMAL HIGH (ref 0.44–1.00)
GFR, Estimated: 5 mL/min — ABNORMAL LOW (ref >60)
Glucose, Bld: 122 mg/dL — ABNORMAL HIGH (ref 70–99)
Potassium: 4.5 mmol/L (ref 3.5–5.1)
Sodium: 141 mmol/L (ref 135–145)
Total Bilirubin: 0.6 mg/dL (ref 0.3–1.2)
Total Protein: 8 g/dL (ref 6.5–8.1)

## 2020-01-22 LAB — CBC WITH DIFFERENTIAL/PLATELET
Abs Immature Granulocytes: 0.07 10*3/uL (ref 0.00–0.07)
Basophils Absolute: 0.1 10*3/uL (ref 0.0–0.1)
Basophils Relative: 0 %
Eosinophils Absolute: 0.1 10*3/uL (ref 0.0–0.5)
Eosinophils Relative: 1 %
HCT: 32.4 % — ABNORMAL LOW (ref 36.0–46.0)
Hemoglobin: 10.6 g/dL — ABNORMAL LOW (ref 12.0–15.0)
Immature Granulocytes: 1 %
Lymphocytes Relative: 8 %
Lymphs Abs: 0.9 10*3/uL (ref 0.7–4.0)
MCH: 29.2 pg (ref 26.0–34.0)
MCHC: 32.7 g/dL (ref 30.0–36.0)
MCV: 89.3 fL (ref 80.0–100.0)
Monocytes Absolute: 0.7 10*3/uL (ref 0.1–1.0)
Monocytes Relative: 6 %
Neutro Abs: 9.9 10*3/uL — ABNORMAL HIGH (ref 1.7–7.7)
Neutrophils Relative %: 84 %
Platelets: 206 10*3/uL (ref 150–400)
RBC: 3.63 MIL/uL — ABNORMAL LOW (ref 3.87–5.11)
RDW: 15.9 % — ABNORMAL HIGH (ref 11.5–15.5)
WBC: 11.7 10*3/uL — ABNORMAL HIGH (ref 4.0–10.5)
nRBC: 0 % (ref 0.0–0.2)

## 2020-01-22 LAB — RESP PANEL BY RT-PCR (RSV, FLU A&B, COVID)  RVPGX2
Influenza A by PCR: NEGATIVE
Influenza B by PCR: NEGATIVE
Resp Syncytial Virus by PCR: NEGATIVE
SARS Coronavirus 2 by RT PCR: NEGATIVE

## 2020-01-22 LAB — TROPONIN I (HIGH SENSITIVITY): Troponin I (High Sensitivity): 44 ng/L — ABNORMAL HIGH (ref <18)

## 2020-01-22 LAB — BRAIN NATRIURETIC PEPTIDE: B Natriuretic Peptide: 871.9 pg/mL — ABNORMAL HIGH (ref 0.0–100.0)

## 2020-01-22 LAB — LACTIC ACID, PLASMA
Lactic Acid, Venous: 2.5 mmol/L (ref 0.5–1.9)
Lactic Acid, Venous: 1.3 mmol/L (ref 0.5–1.9)

## 2020-01-22 MED ORDER — SODIUM CHLORIDE 0.9 % IV SOLN: 100.0000 mL | INTRAVENOUS | Status: DC | PRN

## 2020-01-22 MED ORDER — PENTAFLUOROPROP-TETRAFLUOROETH EX AERO
1.0000 "application " | INHALATION_SPRAY | CUTANEOUS | Status: DC | PRN
  Filled 2020-01-22: qty 30

## 2020-01-22 MED ORDER — FUROSEMIDE 10 MG/ML IJ SOLN
80.0000 mg | Freq: Once | INTRAMUSCULAR | Status: AC
  Administered 2020-01-23: 80 mg via INTRAVENOUS
  Filled 2020-01-22: qty 8

## 2020-01-22 MED ORDER — CHLORHEXIDINE GLUCONATE CLOTH 2 % EX PADS
6.0000 | MEDICATED_PAD | Freq: Every day | CUTANEOUS | Status: DC
  Administered 2020-01-26: 6 via TOPICAL
  Filled 2020-01-22 (×3): qty 6

## 2020-01-22 MED ORDER — LIDOCAINE HCL (PF) 1 % IJ SOLN
5.0000 mL | INTRAMUSCULAR | Status: DC | PRN
  Filled 2020-01-22: qty 5

## 2020-01-22 MED ORDER — ALTEPLASE 2 MG IJ SOLR
2.0000 mg | Freq: Once | INTRAMUSCULAR | Status: DC | PRN
  Filled 2020-01-22: qty 2

## 2020-01-22 MED ORDER — LIDOCAINE-PRILOCAINE 2.5-2.5 % EX CREA
1.0000 "application " | TOPICAL_CREAM | CUTANEOUS | Status: DC | PRN
  Filled 2020-01-22: qty 5

## 2020-01-22 MED ORDER — HEPARIN SODIUM (PORCINE) 1000 UNIT/ML DIALYSIS
1000.0000 [IU] | INTRAMUSCULAR | Status: DC | PRN
  Filled 2020-01-22: qty 1

## 2020-01-22 NOTE — ED Triage Notes (Signed)
CHF hx and presents with respiratory distress. 70% RA on ems arrival CPAP placed and increased to 93%. Pt dialysis patient with last treatment Friday. Pt HTN on EMS arrival with systolic 161 and last systolic 096. Patient alert and oriented x2 and responding to commands. Symmetric chest rise and fall noted. Patient speaking in 1 word sentences. MD med pt in room with RN.  1.25 mg Enalapril, 1 spray nitro, 1 inch nitro on chest, 324 asa admin en route with ems.

## 2020-01-22 NOTE — ED Provider Notes (Signed)
[facility] Emergency Department Provider Note   ____________________________________________   Event Date/Time   First MD Initiated Contact with Patient 01/22/20 2115     (approximate)  I have reviewed the triage vital signs and the nursing notes.   HISTORY  Chief Complaint Respiratory Distress    HPI Teresa Cook is a 65 y.o. female patient has COPD CHF and gets dialysis.  Last dialysis was yesterday.  She became very short of breath today.  EMS was called and found her 70% on room air.  CPAP increase this to 93%.  Patient's initial blood pressure was 935 systolic she got enalapril and 1 spray of nitro.  1 nitro inch on the chest.  Patient's next blood pressure was 701 systolic.  Patient reports she is feeling better now.         Past Medical History:  Diagnosis Date  . Anemia   . Anxiety   . Breast cancer ([place]) 01/2016   bilateral  . CHF (congestive heart failure) ([place])   . Chronic kidney disease   . Depression   . Dialysis patient ([place])   . DVT (deep venous thrombosis) (H[facility])    left leg  . DVT (deep venous thrombosis) ([place]) 1985   right thigh  . Dysrhythmia   . Gout   . Headache   . HTN (hypertension)   . Hypertension   . Parathyroid abnormality ([place])   . Parathyroid disease ([place])   . Pneumonia 12/2015  . Psoriasis   . Renal insufficiency   . Sickle cell trait ([place])    traits    Patient Active Problem List   Diagnosis Date Noted  . CHF (congestive heart failure) ([place]) 01/13/2017  . Protein-calorie malnutrition, severe 01/13/2017  . History of seizure 11/19/2016  . NICM (nonischemic cardiomyopathy) ([place]) 09/12/2016  . Elevated troponin 09/12/2016  . PNA (pneumonia) 09/12/2016  . Accelerated hypertension 09/12/2016  . Pressure injury of skin 09/11/2016  . Headache disorder 05/14/2016  . Spell of altered cognition 05/14/2016  . HTN (hypertension) 02/29/2016  . Moderate recurrent major depression ([place]) 02/29/2016  .  Panic attacks 02/29/2016  . Scalp lesion   . Breast cancer ([place]) 01/23/2016  . ESRD (end stage renal disease) ([place]) 01/22/2016  . Severe anemia 01/01/2016  . Seizure ([place]) 12/31/2015  . Healthcare-associated pneumonia   . Bloodstream infection due to Port-A-Cath, initial encounter   . Sepsis ([place]) 12/15/2015  . Chest pain 08/31/2015  . SIRS (systemic inflammatory response syndrome) ([place]) 08/27/2015  . Malignant neoplasm of upper-inner quadrant of left breast in female, estrogen receptor positive ([place]) 08/05/2015  . Pulmonary edema 07/09/2015  . Dizziness 03/03/2015  . Seizures ([place]) 03/03/2015  . Spells 11/11/2014  . Acute respiratory failure with hypoxia ([place]) 09/19/2014  . Left ventricular dysfunction 09/06/2014  . Acute pulmonary edema ([place]) 08/30/2014  . Respiratory failure ([place]) 08/29/2014  . Respiratory difficulty 08/29/2014  . Acute on chronic systolic heart failure ([place]) 05/16/2014  . Hypertension 05/16/2014  . Renal failure 05/16/2014  . Anxiety 05/16/2014  . Sickle cell trait ([place]) 03/03/2014  . ESRD on dialysis ([place]) 11/02/2013  . Dysgeusia 01/27/2013  . Weight loss 01/27/2013  . Resting tremor 08/13/2012  . Depression 04/30/2012  . 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 Right leg DVT ([place]) 04/30/2012    Past Surgical History:  Procedure Laterality Date  . ABDOMINAL HYSTERECTOMY  1980  . APPENDECTOMY    . BREAST BIOPSY Left 10/28/2013   benign  . BREAST EXCISIONAL  BIOPSY Left 2002   benign  . INSERTION OF DIALYSIS CATHETER  2014  . LIPOMA EXCISION N/A 01/23/2016   Procedure: EXCISION LIPOMA;  Surgeon: Hubbard Robinson, MD;  Location: [facility] ORS;  Service: General;  Laterality: N/A;  . MASTECTOMY W/ SENTINEL NODE BIOPSY Bilateral 01/23/2016   Procedure: bilateral MASTECTOMY WITH  bilateral SENTINEL LYMPH NODE BIOPSY possible left axillary node dissection forehead lipoma removal;  Surgeon: Hubbard Robinson, MD;  Location: [facility] ORS;  Service:  General;  Laterality: Bilateral;  . PARTIAL HYSTERECTOMY    . PERIPHERAL VASCULAR CATHETERIZATION N/A 12/25/2015   Procedure: Dialysis/Perma Catheter Insertion;  Surgeon: Algernon Huxley, MD;  Location: [place] [facility];  Service: Cardiovascular;  Laterality: N/A;  . PERIPHERAL VASCULAR CATHETERIZATION Left 01/22/2016   Procedure: Dialysis/Perma Catheter Insertion;  Surgeon: Algernon Huxley, MD;  Location: [place] [facility];  Service: Cardiovascular;  Laterality: Left;  . PERIPHERAL VASCULAR CATHETERIZATION N/A 01/26/2016   Procedure: Dialysis/Perma Catheter Insertion;  Surgeon: Katha Cabal, MD;  Location: [place] [facility];  Service: Cardiovascular;  Laterality: N/A;  . PORT-A-CATH REMOVAL N/A 12/20/2015   Procedure: REMOVAL PORT-A-CATH;  Surgeon: Hubbard Robinson, MD;  Location: [facility] ORS;  Service: General;  Laterality: N/A;  left    . PORTACATH PLACEMENT Left 08/21/2015   Procedure: INSERTION PORT-A-CATH;  Surgeon: Hubbard Robinson, MD;  Location: [facility] ORS;  Service: General;  Laterality: Left;  . REMOVAL OF A DIALYSIS CATHETER  2017    Prior to Admission medications   Medication Sig Start Date End Date Taking? Authorizing Provider  b complex-vitamin c-folic acid (NEPHRO-VITE) 0.8 MG TABS tablet Take 1 tablet by mouth daily.    [provider]  cinacalcet (SENSIPAR) 60 MG tablet Take 60 mg by mouth daily.    [provider]  feeding supplement, ENSURE ENLIVE, (ENSURE ENLIVE) LIQD Take 237 mLs by mouth 3 (three) times daily between meals. Patient taking differently: Take 237 mLs by mouth daily.  12/27/15   [name], MD  hydrALAZINE (APRESOLINE) 50 MG tablet Take 1 tablet (50 mg total) by mouth every 8 (eight) hours. 09/14/16   Dustin Flock, MD  HYDROcodone-acetaminophen (NORCO/VICODIN) 5-325 MG tablet Take 1 tablet by mouth every 4 (four) hours as needed for moderate pain. 01/14/17   Epifanio Lesches, MD  isosorbide mononitrate (IMDUR) 30 MG 24 hr tablet  Take 1 tablet (30 mg total) by mouth daily. 09/14/16   Dustin Flock, MD  losartan (COZAAR) 50 MG tablet Take 3 tablets (150 mg total) by mouth daily. 09/14/16   Dustin Flock, MD  metoprolol succinate (TOPROL-XL) 100 MG 24 hr tablet Take 1 tablet (100 mg total) by mouth daily. Take with or immediately following a meal. 09/14/16   Dustin Flock, MD  pantoprazole (PROTONIX) 40 MG tablet Take 1 tablet (40 mg total) by mouth daily. 01/14/17   Epifanio Lesches, MD  sertraline (ZOLOFT) 50 MG tablet Take 3 tablets (150 mg total) by mouth daily. Patient taking differently: Take 200 mg by mouth daily.  03/01/16   [name], MD  sevelamer carbonate (RENVELA) 800 MG tablet Take 2,400 mg by mouth 3 (three) times daily with meals. Tid with meals and 1 tablet with snacks once a day    [provider]  traZODone (DESYREL) 50 MG tablet Take 50 mg by mouth at bedtime. May increase to 2 tablets if needed.    [provider]  warfarin (COUMADIN) 5 MG tablet take 5 mg on friday, saturday, sunday. and  7.5 mg on monday, tuesday, wednesday, thursday 11/03/13   [provider]  zolpidem (AMBIEN) 5 MG tablet Take 1 tablet (5 mg total) by mouth at bedtime. Patient taking differently: Take 10 mg by mouth at bedtime.  03/01/16   [name], MD    Allergies Gabapentin and Adhesive [tape]  Family History  Problem Relation Age of Onset  . Stroke Mother   . CVA Mother   . Hypertension Mother   . Hypertension Father   . Hypertension Sister   . Diabetes Sister   . Cancer Sister 2       Breast  . Stroke Brother   . Hypertension Brother   . Breast cancer Maternal Aunt 70    Social History Social History   Tobacco Use  . Smoking status: Never Smoker  . Smokeless tobacco: Never Used  Vaping Use  . Vaping Use: Never used  Substance Use Topics  . Alcohol use: No    Alcohol/week: 0.0 standard drinks  . Drug use: No    Review of Systems  Constitutional: No  fever/chills Eyes: No visual changes. ENT: No sore throat. Cardiovascular: Denies chest pain. Respiratory:  shortness of breath. Gastrointestinal: No abdominal pain.  No nausea, no vomiting.  No diarrhea.  No constipation. Genitourinary: Negative for dysuria. Musculoskeletal: Negative for back pain. Skin: Negative for rash. Neurological: Negative for headaches, focal weakness   ____________________________________________   PHYSICAL EXAM:  VITAL SIGNS: ED Triage Vitals [01/22/20 2114]  Enc Vitals Group     BP      Pulse      Resp      Temp      Temp src      SpO2 96 %     Weight 115 lb 4.8 oz (52.3 kg)     Height 5\' 5"  (1.651 m)     Head Circumference      Peak Flow      Pain Score      Pain Loc      Pain Edu?      Excl. in [place]?     Constitutional: Alert and oriented looks short of breath on CPAP Eyes: Conjunctivae are normal.  Head: Atraumatic. Nose: No congestion/rhinnorhea. Mouth/Throat: Mucous membranes are moist.  Oropharynx non-erythematous. Neck: No stridor. Cardiovascular: Normal rate, regular rhythm. Grossly normal heart sounds.  Good peripheral circulation. Respiratory: Normal respiratory effort.  No retractions. Lungs scattered crackles Gastrointestinal: Soft and nontender. No distention. No abdominal bruits. . Musculoskeletal: No lower extremity tenderness trace edema.   Neurologic:  Normal speech and language. No gross focal neurologic deficits are appreciated. Skin:  Skin is warm, dry and intact. No rash noted.   ____________________________________________   LABS (all labs ordered are listed, but only abnormal results are displayed)  Labs Reviewed  COMPREHENSIVE METABOLIC PANEL - Abnormal; Notable for the following components:      Result Value   Glucose, Bld 122 (*)    BUN 47 (*)    Creatinine, Ser 8.13 (*)    GFR, Estimated 5 (*)    Anion gap 17 (*)    All other components within normal limits  BRAIN NATRIURETIC PEPTIDE - Abnormal;  Notable for the following components:   B Natriuretic Peptide 871.9 (*)    All other components within normal limits  LACTIC ACID, PLASMA - Abnormal; Notable for the following components:   Lactic Acid, Venous 2.5 (*)    All other components within normal limits  TROPONIN I (HIGH SENSITIVITY) - Abnormal; Notable  for the following components:   Troponin I (High Sensitivity) 44 (*)    All other components within normal limits  RESP PANEL BY RT-PCR (RSV, FLU A&B, COVID)  [id number]  LACTIC ACID, PLASMA  CBC WITH DIFFERENTIAL/PLATELET  CBC WITH DIFFERENTIAL/PLATELET  PROTIME-INR  APTT  TROPONIN I (HIGH SENSITIVITY)   ____________________________________________  EKG   ____________________________________________  RADIOLOGY Gertha Calkin, personally viewed and evaluated these images (plain radiographs) as part of my medical decision making, as well as reviewing the written report by the radiologist.  ED MD interpretation: Chest x-ray looks like there is increased interstitial markings on both sides but worse on the right.  Official radiology report(s): DG Chest Portable 1 View  Result Date: 01/22/2020 CLINICAL DATA:  Respiratory distress. EXAM: PORTABLE CHEST 1 VIEW COMPARISON:  January 12, 2017 FINDINGS: There is stable left-sided venous catheter positioning. Mild to moderate severity diffusely increased lung markings are seen. There is no evidence of a pleural effusion or pneumothorax. The heart size and mediastinal contours are within normal limits. There is marked severity calcification of the thoracic aorta. Moderate severity levoscoliosis of the lower thoracic spine is seen. IMPRESSION: Mild to moderate severity interstitial edema. Electronically Signed   By: Virgina Norfolk M.D.   On: 01/22/2020 21:59    ____________________________________________   PROCEDURES  Procedure(s) performed (including Critical  Care):  Procedures   ____________________________________________   INITIAL IMPRESSION / ASSESSMENT AND PLAN / ED COURSE  Discussed patient with Dr. Zollie Scale.  He wishes me to try some Lasix for this patient.  We will do this.  Patient feels better and wants to get off the BiPAP.  We will attempt this as well while monitoring her oxygen level carefully.  Hospitalist wants to get the COVID test back.  He does not want to wait for the 6 to 12-hour COVID test to get back.  He asked me to do the 2-hour COVID test which I have ordered.  I also ordered the antigen test as well.  If this is positive we can cancel a 2-hour test.  Hospitalist feels that if the patient is COVID-positive she will have to go to the ICU.             ____________________________________________   FINAL CLINICAL IMPRESSION(S) / ED DIAGNOSES  Final diagnoses:  Congestive heart failure, unspecified HF chronicity, unspecified heart failure type ([place])  Hypoxia  Hypertensive urgency     ED Discharge Orders    None      *Please note:  Teresa Cook was evaluated in Emergency Department on 01/22/2020 for the symptoms described in the history of present illness. She was evaluated in the context of the global COVID-19 pandemic, which necessitated consideration that the patient might be at risk for infection with the SARS-CoV-2 virus that causes COVID-19. Institutional protocols and algorithms that pertain to the evaluation of patients at risk for COVID-19 are in a state of rapid change based on information released by regulatory bodies including the CDC and federal and state organizations. These policies and algorithms were followed during the patient's care in the ED.  Some ED evaluations and interventions may be delayed as a result of limited staffing during and the pandemic.*   Note:  This document was prepared using Dragon voice recognition software and may include unintentional dictation errors.    Nena Polio, MD 01/22/20 [phone]

## 2020-01-23 ENCOUNTER — Inpatient Hospital Stay
Admit: 2020-01-23 | Discharge: 2020-01-23 | Disposition: A | Discharge status: Home / Self Care | Payer: Medicare Other | Attending: Family Medicine | Admitting: Family Medicine

## 2020-01-23 DIAGNOSIS — F32A Depression, unspecified: Secondary | ICD-10-CM | POA: Diagnosis present

## 2020-01-23 DIAGNOSIS — Z86718 Personal history of other venous thrombosis and embolism: Secondary | ICD-10-CM | POA: Diagnosis not present

## 2020-01-23 DIAGNOSIS — Z20822 Contact with and (suspected) exposure to covid-19: Secondary | ICD-10-CM | POA: Diagnosis present

## 2020-01-23 DIAGNOSIS — I4891 Unspecified atrial fibrillation: Secondary | ICD-10-CM | POA: Diagnosis present

## 2020-01-23 DIAGNOSIS — F419 Anxiety disorder, unspecified: Secondary | ICD-10-CM | POA: Diagnosis present

## 2020-01-23 DIAGNOSIS — N2581 Secondary hyperparathyroidism of renal origin: Secondary | ICD-10-CM | POA: Diagnosis present

## 2020-01-23 DIAGNOSIS — D72828 Other elevated white blood cell count: Secondary | ICD-10-CM | POA: Diagnosis present

## 2020-01-23 DIAGNOSIS — I5023 Acute on chronic systolic (congestive) heart failure: Secondary | ICD-10-CM | POA: Diagnosis present

## 2020-01-23 DIAGNOSIS — I509 Heart failure, unspecified: Secondary | ICD-10-CM | POA: Diagnosis not present

## 2020-01-23 DIAGNOSIS — K219 Gastro-esophageal reflux disease without esophagitis: Secondary | ICD-10-CM | POA: Diagnosis present

## 2020-01-23 DIAGNOSIS — Z992 Dependence on renal dialysis: Secondary | ICD-10-CM | POA: Diagnosis not present

## 2020-01-23 DIAGNOSIS — D573 Sickle-cell trait: Secondary | ICD-10-CM | POA: Diagnosis present

## 2020-01-23 DIAGNOSIS — J9601 Acute respiratory failure with hypoxia: Secondary | ICD-10-CM | POA: Diagnosis present

## 2020-01-23 DIAGNOSIS — Z853 Personal history of malignant neoplasm of breast: Secondary | ICD-10-CM | POA: Diagnosis not present

## 2020-01-23 DIAGNOSIS — M109 Gout, unspecified: Secondary | ICD-10-CM | POA: Diagnosis present

## 2020-01-23 DIAGNOSIS — E785 Hyperlipidemia, unspecified: Secondary | ICD-10-CM | POA: Diagnosis present

## 2020-01-23 DIAGNOSIS — I429 Cardiomyopathy, unspecified: Secondary | ICD-10-CM | POA: Diagnosis present

## 2020-01-23 DIAGNOSIS — Z7901 Long term (current) use of anticoagulants: Secondary | ICD-10-CM | POA: Diagnosis not present

## 2020-01-23 DIAGNOSIS — R0902 Hypoxemia: Secondary | ICD-10-CM | POA: Diagnosis present

## 2020-01-23 DIAGNOSIS — N186 End stage renal disease: Secondary | ICD-10-CM | POA: Diagnosis present

## 2020-01-23 DIAGNOSIS — I132 Hypertensive heart and chronic kidney disease with heart failure and with stage 5 chronic kidney disease, or end stage renal disease: Secondary | ICD-10-CM | POA: Diagnosis present

## 2020-01-23 DIAGNOSIS — I5021 Acute systolic (congestive) heart failure: Secondary | ICD-10-CM | POA: Diagnosis not present

## 2020-01-23 DIAGNOSIS — Z79899 Other long term (current) drug therapy: Secondary | ICD-10-CM | POA: Diagnosis not present

## 2020-01-23 DIAGNOSIS — I160 Hypertensive urgency: Secondary | ICD-10-CM | POA: Diagnosis present

## 2020-01-23 DIAGNOSIS — Z9013 Acquired absence of bilateral breasts and nipples: Secondary | ICD-10-CM | POA: Diagnosis not present

## 2020-01-23 DIAGNOSIS — Z803 Family history of malignant neoplasm of breast: Secondary | ICD-10-CM | POA: Diagnosis not present

## 2020-01-23 DIAGNOSIS — D631 Anemia in chronic kidney disease: Secondary | ICD-10-CM | POA: Diagnosis present

## 2020-01-23 LAB — APTT: aPTT: 36 seconds (ref 24–36)

## 2020-01-23 LAB — COMPREHENSIVE METABOLIC PANEL
ALT: 26 U/L (ref 0–44)
AST: 26 U/L (ref 15–41)
Albumin: 3.7 g/dL (ref 3.5–5.0)
Alkaline Phosphatase: 91 U/L (ref 38–126)
Anion gap: 12 (ref 5–15)
BUN: 27 mg/dL — ABNORMAL HIGH (ref 8–23)
CO2: 25 mmol/L (ref 22–32)
Calcium: 8.9 mg/dL (ref 8.9–10.3)
Chloride: 103 mmol/L (ref 98–111)
Creatinine, Ser: 5.73 mg/dL — ABNORMAL HIGH (ref 0.44–1.00)
GFR, Estimated: 8 mL/min — ABNORMAL LOW (ref >60)
Glucose, Bld: 98 mg/dL (ref 70–99)
Potassium: 3.7 mmol/L (ref 3.5–5.1)
Sodium: 140 mmol/L (ref 135–145)
Total Bilirubin: 0.8 mg/dL (ref 0.3–1.2)
Total Protein: 7.2 g/dL (ref 6.5–8.1)

## 2020-01-23 LAB — PROTIME-INR
INR: 2.6 — ABNORMAL HIGH (ref 0.8–1.2)
Prothrombin Time: 27.1 seconds — ABNORMAL HIGH (ref 11.4–15.2)

## 2020-01-23 LAB — CBC
HCT: 33.1 % — ABNORMAL LOW (ref 36.0–46.0)
HCT: 32.3 % — ABNORMAL LOW (ref 36.0–46.0)
Hemoglobin: 10.7 g/dL — ABNORMAL LOW (ref 12.0–15.0)
Hemoglobin: 10.7 g/dL — ABNORMAL LOW (ref 12.0–15.0)
MCH: 28.2 pg (ref 26.0–34.0)
MCH: 28.8 pg (ref 26.0–34.0)
MCHC: 32.3 g/dL (ref 30.0–36.0)
MCHC: 33.1 g/dL (ref 30.0–36.0)
MCV: 87.3 fL (ref 80.0–100.0)
MCV: 86.8 fL (ref 80.0–100.0)
Platelets: 224 10*3/uL (ref 150–400)
Platelets: 204 10*3/uL (ref 150–400)
RBC: 3.79 MIL/uL — ABNORMAL LOW (ref 3.87–5.11)
RBC: 3.72 MIL/uL — ABNORMAL LOW (ref 3.87–5.11)
RDW: 15.9 % — ABNORMAL HIGH (ref 11.5–15.5)
RDW: 15.8 % — ABNORMAL HIGH (ref 11.5–15.5)
WBC: 10.1 10*3/uL (ref 4.0–10.5)
WBC: 10.1 10*3/uL (ref 4.0–10.5)
nRBC: 0 % (ref 0.0–0.2)
nRBC: 0 % (ref 0.0–0.2)

## 2020-01-23 LAB — ECHOCARDIOGRAM COMPLETE
AR max vel: 2.71 cm2
AV Area VTI: 2.43 cm2
AV Area mean vel: 2.33 cm2
AV Mean grad: 3 mmHg
AV Peak grad: 4.8 mmHg
Ao pk vel: 1.1 m/s
Area-P 1/2: 5.5 cm2
Height: 65 in
MV VTI: 2.13 cm2
S' Lateral: 2.7 cm
Weight: 1844.81 oz

## 2020-01-23 LAB — CREATININE, SERUM
Creatinine, Ser: 5.73 mg/dL — ABNORMAL HIGH (ref 0.44–1.00)
GFR, Estimated: 8 mL/min — ABNORMAL LOW (ref >60)

## 2020-01-23 LAB — TROPONIN I (HIGH SENSITIVITY): Troponin I (High Sensitivity): 57 ng/L — ABNORMAL HIGH (ref <18)

## 2020-01-23 LAB — PHOSPHORUS: Phosphorus: 2.9 mg/dL (ref 2.5–4.6)

## 2020-01-23 LAB — HEPATITIS B SURFACE ANTIGEN: Hepatitis B Surface Ag: NONREACTIVE

## 2020-01-23 LAB — HIV ANTIBODY (ROUTINE TESTING W REFLEX): HIV Screen 4th Generation wRfx: NONREACTIVE

## 2020-01-23 MED ORDER — LOSARTAN POTASSIUM 50 MG PO TABS
150.0000 mg | ORAL_TABLET | Freq: Every day | ORAL | Status: DC
  Administered 2020-01-23 – 2020-01-26 (×3): 150 mg via ORAL
  Filled 2020-01-23 (×4): qty 3

## 2020-01-23 MED ORDER — POLYETHYLENE GLYCOL 3350 17 G PO PACK: 17.0000 g | PACK | Freq: Every day | ORAL | Status: DC | PRN

## 2020-01-23 MED ORDER — RENA-VITE PO TABS
1.0000 | ORAL_TABLET | Freq: Every day | ORAL | Status: DC
  Administered 2020-01-24 – 2020-01-26 (×4): 1 via ORAL
  Filled 2020-01-23 (×4): qty 1

## 2020-01-23 MED ORDER — HYDRALAZINE HCL 50 MG PO TABS
50.0000 mg | ORAL_TABLET | Freq: Three times a day (TID) | ORAL | Status: DC
  Administered 2020-01-23 – 2020-01-26 (×5): 50 mg via ORAL
  Filled 2020-01-23 (×6): qty 1

## 2020-01-23 MED ORDER — ACETAMINOPHEN 325 MG PO TABS
650.0000 mg | ORAL_TABLET | Freq: Four times a day (QID) | ORAL | Status: DC | PRN
  Administered 2020-01-23 – 2020-01-26 (×3): 650 mg via ORAL
  Filled 2020-01-23 (×3): qty 2

## 2020-01-23 MED ORDER — FUROSEMIDE 10 MG/ML IJ SOLN
40.0000 mg | Freq: Two times a day (BID) | INTRAMUSCULAR | Status: DC
  Administered 2020-01-23 (×2): 40 mg via INTRAVENOUS
  Filled 2020-01-23 (×5): qty 4

## 2020-01-23 MED ORDER — LOPERAMIDE HCL 2 MG PO CAPS
2.0000 mg | ORAL_CAPSULE | ORAL | Status: DC | PRN
  Administered 2020-01-23: 2 mg via ORAL
  Filled 2020-01-23: qty 1

## 2020-01-23 MED ORDER — PANTOPRAZOLE SODIUM 40 MG PO TBEC
40.0000 mg | DELAYED_RELEASE_TABLET | Freq: Every day | ORAL | Status: DC
  Administered 2020-01-23 – 2020-01-26 (×3): 40 mg via ORAL
  Filled 2020-01-23 (×3): qty 1

## 2020-01-23 MED ORDER — ONDANSETRON HCL 4 MG PO TABS: 4.0000 mg | ORAL_TABLET | Freq: Four times a day (QID) | ORAL | Status: DC | PRN

## 2020-01-23 MED ORDER — HYDROCODONE-ACETAMINOPHEN 5-325 MG PO TABS
1.0000 | ORAL_TABLET | ORAL | Status: DC | PRN
  Administered 2020-01-24: 1 via ORAL
  Filled 2020-01-23: qty 1

## 2020-01-23 MED ORDER — ZOLPIDEM TARTRATE 5 MG PO TABS
5.0000 mg | ORAL_TABLET | Freq: Every day | ORAL | Status: DC
  Administered 2020-01-23 – 2020-01-25 (×4): 5 mg via ORAL
  Filled 2020-01-23 (×4): qty 1

## 2020-01-23 MED ORDER — ONDANSETRON HCL 4 MG/2ML IJ SOLN: 4.0000 mg | Freq: Four times a day (QID) | INTRAMUSCULAR | Status: DC | PRN

## 2020-01-23 MED ORDER — CINACALCET HCL 30 MG PO TABS
60.0000 mg | ORAL_TABLET | Freq: Every day | ORAL | Status: DC
  Administered 2020-01-24 – 2020-01-26 (×2): 60 mg via ORAL
  Filled 2020-01-23 (×5): qty 2

## 2020-01-23 MED ORDER — METOPROLOL SUCCINATE ER 100 MG PO TB24
100.0000 mg | ORAL_TABLET | Freq: Every day | ORAL | Status: DC
  Administered 2020-01-23 – 2020-01-26 (×4): 100 mg via ORAL
  Filled 2020-01-23: qty 1
  Filled 2020-01-23 (×2): qty 2
  Filled 2020-01-23: qty 1

## 2020-01-23 MED ORDER — SEVELAMER CARBONATE 800 MG PO TABS
2400.0000 mg | ORAL_TABLET | Freq: Three times a day (TID) | ORAL | Status: DC
  Administered 2020-01-23 – 2020-01-26 (×8): 2400 mg via ORAL
  Filled 2020-01-23 (×12): qty 3

## 2020-01-23 MED ORDER — ISOSORBIDE MONONITRATE ER 30 MG PO TB24
30.0000 mg | ORAL_TABLET | Freq: Every day | ORAL | Status: DC
  Administered 2020-01-23 – 2020-01-26 (×4): 30 mg via ORAL
  Filled 2020-01-23 (×4): qty 1

## 2020-01-23 MED ORDER — WARFARIN - PHARMACIST DOSING INPATIENT
Freq: Every day | Status: DC
  Filled 2020-01-23: qty 1

## 2020-01-23 MED ORDER — HEPARIN SODIUM (PORCINE) 5000 UNIT/ML IJ SOLN: 5000.0000 [IU] | Freq: Three times a day (TID) | INTRAMUSCULAR | Status: DC

## 2020-01-23 MED ORDER — WARFARIN SODIUM 5 MG PO TABS
5.0000 mg | ORAL_TABLET | Freq: Once | ORAL | Status: AC
  Administered 2020-01-23: 5 mg via ORAL
  Filled 2020-01-23: qty 1

## 2020-01-23 MED ORDER — ACETAMINOPHEN 650 MG RE SUPP: 650.0000 mg | Freq: Four times a day (QID) | RECTAL | Status: DC | PRN

## 2020-01-23 MED ORDER — TRAZODONE HCL 50 MG PO TABS
50.0000 mg | ORAL_TABLET | Freq: Every day | ORAL | Status: DC
  Administered 2020-01-23 – 2020-01-25 (×3): 50 mg via ORAL
  Filled 2020-01-23 (×4): qty 1

## 2020-01-23 MED ORDER — HEPARIN SOD (PORK) LOCK FLUSH 100 UNIT/ML IV SOLN
INTRAVENOUS | Status: AC
  Filled 2020-01-23: qty 10

## 2020-01-23 MED ORDER — SERTRALINE HCL 50 MG PO TABS
200.0000 mg | ORAL_TABLET | Freq: Every day | ORAL | Status: DC
  Administered 2020-01-23 – 2020-01-26 (×4): 200 mg via ORAL
  Filled 2020-01-23 (×4): qty 4

## 2020-01-23 MED ORDER — LABETALOL HCL 5 MG/ML IV SOLN: 20.0000 mg | INTRAVENOUS | Status: DC | PRN

## 2020-01-23 MED ORDER — ENSURE ENLIVE PO LIQD: 237.0000 mL | Freq: Every day | ORAL | Status: DC

## 2020-01-23 MED ORDER — TRAZODONE HCL 50 MG PO TABS
25.0000 mg | ORAL_TABLET | Freq: Every evening | ORAL | Status: DC | PRN
  Administered 2020-01-23: 25 mg via ORAL

## 2020-01-23 MED ORDER — WARFARIN SODIUM 5 MG PO TABS: 5.0000 mg | ORAL_TABLET | ORAL | Status: DC

## 2020-01-23 NOTE — ED Notes (Signed)
Dialysis finished. Per dialysis RN [id number] was removed. Pt in NAD at this time and states she is feeling much better. Awaiting further orders. Will continue to monitor.

## 2020-01-23 NOTE — Progress Notes (Signed)
*  PRELIMINARY RESULTS* Echocardiogram 2D Echocardiogram has been performed.  Teresa Cook 01/23/2020, 10:23 AM

## 2020-01-23 NOTE — ED Notes (Addendum)
Dialysis RN requesting Heparin flush. RN had to override heparin flush in Pyxis. Heparin flush given to dialysis RN for administration.  Pharmacist notified that RN was unable to link heparin flush to heparin dialysis order.

## 2020-01-23 NOTE — Progress Notes (Signed)
[facility]  ROUNDING NOTE   Subjective:   Patient well-known to [place] as we follow her for outpatient hemodialysis. Came in with significant shortness of breath. Was initially on BiPAP but weaned off. Underwent urgent dialysis and now improved.  Objective:  Vital signs in last 24 hours:  Temp:  [97.6 F (36.4 C)-98.5 F (36.9 C)] 98.1 F (36.7 C) (01/16 1555) Pulse Rate:  [39-116] 81 (01/16 1555) Resp:  [16-32] 22 (01/16 1555) BP: (83-168)/(68-134) 136/99 (01/16 1555) SpO2:  [90 %-100 %] 92 % (01/16 1555) Weight:  [52.3 kg] 52.3 kg (01/15 2114)  Weight change:  Filed Weights   01/22/20 2114  Weight: 52.3 kg    Intake/Output: I/O last 3 completed shifts: In: -  Out: 1830 [Other:1830]   Intake/Output this shift:  No intake/output data recorded.  Physical Exam: General:  No acute distress  Head:  Normocephalic, atraumatic. Moist oral mucosal membranes  Eyes:  Anicteric  Neck:  Supple  Lungs:   Mild basilar rales  Heart:  S1S2 no rubs  Abdomen:   Soft, nontender, bowel sounds present  Extremities:  Trace peripheral edema.  Neurologic:  Awake, alert, following commands  Skin:  No lesions  Access:  Left IJ PermCath    Basic Metabolic Panel: Recent Labs  Lab 01/22/20 2128 01/23/20 0150 01/23/20 0437  NA 141  --  140  K 4.5  --  3.7  CL 102  --  103  CO2 22  --  25  GLUCOSE 122*  --  98  BUN 47*  --  27*  CREATININE 8.13* 5.73* 5.73*  CALCIUM 9.1  --  8.9  PHOS  --  2.9  --     Liver Function Tests: Recent Labs  Lab 01/22/20 2128 01/23/20 0437  AST 31 26  ALT 27 26  ALKPHOS 113 91  BILITOT 0.6 0.8  PROT 8.0 7.2  ALBUMIN 4.0 3.7   No results for input(s): LIPASE, AMYLASE in the last 168 hours. No results for input(s): AMMONIA in the last 168 hours.  CBC: Recent Labs  Lab 01/22/20 2128 01/23/20 0150 01/23/20 0437  WBC 11.7* 10.1 10.1  NEUTROABS 9.9*  --   --   HGB 10.6* 10.7* 10.7*  HCT 32.4* 33.1* 32.3*  MCV 89.3 87.3 86.8   PLT 206 224 204    Cardiac Enzymes: No results for input(s): CKTOTAL, CKMB, CKMBINDEX, TROPONINI in the last 168 hours.  BNP: Invalid input(s): POCBNP  CBG: No results for input(s): GLUCAP in the last 168 hours.  Microbiology: Results for orders placed or performed during the hospital encounter of 01/22/20  Resp panel by RT-PCR (RSV, Flu A&B, Covid) Nasopharyngeal Swab     Status: None   Collection Time: 01/22/20  9:28 PM   Specimen: Nasopharyngeal Swab; Nasopharyngeal(NP) swabs in vial transport medium  Result Value Ref Range Status   SARS Coronavirus 2 by RT PCR NEGATIVE NEGATIVE Final    Comment: (NOTE) SARS-CoV-2 target nucleic acids are NOT DETECTED.  The SARS-CoV-2 RNA is generally detectable in upper respiratory specimens during the acute phase of infection. The lowest concentration of SARS-CoV-2 viral copies this assay can detect is 138 copies/mL. A negative result does not preclude SARS-Cov-2 infection and should not be used as the sole basis for treatment or other patient management decisions. A negative result may occur with  improper specimen collection/handling, submission of specimen other than nasopharyngeal swab, presence of viral mutation(s) within the areas targeted by this assay, and inadequate number of  viral copies(<138 copies/mL). A negative result must be combined with clinical observations, patient history, and epidemiological information. The expected result is Negative.  Fact Sheet for Patients:  EntrepreneurPulse.com.au  Fact Sheet for Healthcare Providers:  IncredibleEmployment.be  This test is no t yet approved or cleared by the [place] FDA and  has been authorized for detection and/or diagnosis of SARS-CoV-2 by FDA under an Emergency Use Authorization (EUA). This EUA will remain  in effect (meaning this test can be used) for the duration of the COVID-19 declaration under Section 564(b)(1) of the Act,  21 U.S.C.section 360bbb-3(b)(1), unless the authorization is terminated  or revoked sooner.       Influenza A by PCR NEGATIVE NEGATIVE Final   Influenza B by PCR NEGATIVE NEGATIVE Final    Comment: (NOTE) The Xpert Xpress SARS-CoV-2/FLU/RSV plus assay is intended as an aid in the diagnosis of influenza from Nasopharyngeal swab specimens and should not be used as a sole basis for treatment. Nasal washings and aspirates are unacceptable for Xpert Xpress SARS-CoV-2/FLU/RSV testing.  Fact Sheet for Patients: EntrepreneurPulse.com.au  Fact Sheet for Healthcare Providers: IncredibleEmployment.be  This test is not yet approved or cleared by the [place] FDA and has been authorized for detection and/or diagnosis of SARS-CoV-2 by FDA under an Emergency Use Authorization (EUA). This EUA will remain in effect (meaning this test can be used) for the duration of the COVID-19 declaration under Section 564(b)(1) of the Act, 21 U.S.C. section 360bbb-3(b)(1), unless the authorization is terminated or revoked.     Resp Syncytial Virus by PCR NEGATIVE NEGATIVE Final    Comment: (NOTE) Fact Sheet for Patients: EntrepreneurPulse.com.au  Fact Sheet for Healthcare Providers: IncredibleEmployment.be  This test is not yet approved or cleared by the [place] FDA and has been authorized for detection and/or diagnosis of SARS-CoV-2 by FDA under an Emergency Use Authorization (EUA). This EUA will remain in effect (meaning this test can be used) for the duration of the COVID-19 declaration under Section 564(b)(1) of the Act, 21 U.S.C. section 360bbb-3(b)(1), unless the authorization is terminated or revoked.  Performed at [facility], [address] [place], [place] [address]     Coagulation Studies: Recent Labs    01/23/20 0437  LABPROT 27.1*  INR 2.6*    Urinalysis: No results for input(s):  COLORURINE, LABSPEC, PHURINE, GLUCOSEU, HGBUR, BILIRUBINUR, KETONESUR, PROTEINUR, UROBILINOGEN, NITRITE, LEUKOCYTESUR in the last 72 hours.  Invalid input(s): APPERANCEUR    Imaging: DG Chest Portable 1 View  Result Date: 01/22/2020 CLINICAL DATA:  Respiratory distress. EXAM: PORTABLE CHEST 1 VIEW COMPARISON:  January 12, 2017 FINDINGS: There is stable left-sided venous catheter positioning. Mild to moderate severity diffusely increased lung markings are seen. There is no evidence of a pleural effusion or pneumothorax. The heart size and mediastinal contours are within normal limits. There is marked severity calcification of the thoracic aorta. Moderate severity levoscoliosis of the lower thoracic spine is seen. IMPRESSION: Mild to moderate severity interstitial edema. Electronically Signed   By: Virgina Norfolk M.D.   On: 01/22/2020 21:59   ECHOCARDIOGRAM COMPLETE  Result Date: 01/23/2020    ECHOCARDIOGRAM REPORT   Patient Name:   Teresa Cook Date of Exam: 01/23/2020 Medical Rec #:  [id number]       Height:       65.0 in Accession #:    [id number]      Weight:       115.3 lb Date of Birth:  06/05/1955  BSA:          1.565 m Patient Age:    6 years        BP:           128/96 mmHg Patient Gender: F               HR:           85 bpm. Exam Location:  [facility] Procedure: 2D Echo, Color Doppler and Cardiac Doppler Indications:     I69.62 CHF-Acute Systolic  History:         Patient has prior history of Echocardiogram examinations. CHF,                  CKD; Risk Factors:Hypertension.  Sonographer:     Charmayne Sheer RDCS (AE) Referring Phys:  [id number] Arvella Merles [place] Diagnosing Phys: Bartholome Bill MD  Sonographer Comments: Suboptimal subcostal window. Image acquisition challenging due to respiratory motion. IMPRESSIONS  1. Left ventricular ejection fraction, by estimation, is 40 to 45%. Left ventricular ejection fraction by PLAX is 43 %. The left ventricle has mildly decreased function. The left ventricle  demonstrates regional wall motion abnormalities (see scoring diagram/findings for description). There is moderate left ventricular hypertrophy. Left ventricular diastolic parameters were normal.  2. Right ventricular systolic function is normal. The right ventricular size is mildly enlarged.  3. Left atrial size was mildly dilated.  4. The mitral valve is grossly normal. Mild mitral valve regurgitation.  5. The aortic valve is calcified. Aortic valve regurgitation is trivial. FINDINGS  Left Ventricle: Left ventricular ejection fraction, by estimation, is 40 to 45%. Left ventricular ejection fraction by PLAX is 43 %. The left ventricle has mildly decreased function. The left ventricle demonstrates regional wall motion abnormalities. The left ventricular internal cavity size was normal in size. There is moderate left ventricular hypertrophy. Left ventricular diastolic parameters were normal.  LV Wall Scoring: The mid anteroseptal segment and mid inferolateral segment are hypokinetic. Right Ventricle: The right ventricular size is mildly enlarged. No increase in right ventricular wall thickness. Right ventricular systolic function is normal. Left Atrium: Left atrial size was mildly dilated. Right Atrium: Right atrial size was normal in size. Pericardium: There is no evidence of pericardial effusion. Mitral Valve: The mitral valve is grossly normal. Mild mitral valve regurgitation. MV peak gradient, 3.6 mmHg. The mean mitral valve gradient is 2.0 mmHg. Tricuspid Valve: The tricuspid valve is not well visualized. Tricuspid valve regurgitation is trivial. Aortic Valve: The aortic valve is calcified. Aortic valve regurgitation is trivial. Aortic valve mean gradient measures 3.0 mmHg. Aortic valve peak gradient measures 4.8 mmHg. Aortic valve area, by VTI measures 2.43 cm. Pulmonic Valve: The pulmonic valve was not well visualized. Pulmonic valve regurgitation is trivial. Aorta: The aortic root was not well visualized.  IAS/Shunts: The interatrial septum was not well visualized.  LEFT VENTRICLE PLAX 2D LV EF:         Left            Diastology                ventricular     LV e' medial:    3.92 cm/s                ejection        LV E/e' medial:  22.1                fraction by     LV e' lateral:   6.20  cm/s                PLAX is 43      LV E/e' lateral: 14.0                %. LVIDd:         3.40 cm LVIDs:         2.70 cm LV PW:         0.90 cm LV IVS:        0.80 cm LVOT diam:     2.40 cm LV SV:         49 LV SV Index:   31 LVOT Area:     4.52 cm  RIGHT VENTRICLE RV Basal diam:  2.90 cm LEFT ATRIUM             Index       RIGHT ATRIUM           Index LA diam:        3.00 cm 1.92 cm/m  RA Area:     10.80 cm LA Vol (A2C):   66.4 ml 42.42 ml/m RA Volume:   23.60 ml  15.08 ml/m LA Vol (A4C):   44.9 ml 28.68 ml/m LA Biplane Vol: 56.2 ml 35.90 ml/m  AORTIC VALVE                   PULMONIC VALVE AV Area (Vmax):    2.71 cm    PV Vmax:       0.80 m/s AV Area (Vmean):   2.33 cm    PV Vmean:      55.500 cm/s AV Area (VTI):     2.43 cm    PV VTI:        0.150 m AV Vmax:           110.00 cm/s PV Peak grad:  2.6 mmHg AV Vmean:          87.300 cm/s PV Mean grad:  1.0 mmHg AV VTI:            0.201 m AV Peak Grad:      4.8 mmHg AV Mean Grad:      3.0 mmHg LVOT Vmax:         65.90 cm/s LVOT Vmean:        44.900 cm/s LVOT VTI:          0.108 m LVOT/AV VTI ratio: 0.54  AORTA Ao Root diam: 3.60 cm MITRAL VALVE MV Area (PHT): 5.50 cm    SHUNTS MV Area VTI:   2.13 cm    Systemic VTI:  0.11 m MV Peak grad:  3.6 mmHg    Systemic Diam: 2.40 cm MV Mean grad:  2.0 mmHg MV Vmax:       0.95 m/s MV Vmean:      73.1 cm/s MV Decel Time: 138 msec MV E velocity: 86.50 cm/s MV A velocity: 81.80 cm/s MV E/A ratio:  1.06 Bartholome Bill MD Electronically signed by Bartholome Bill MD Signature Date/Time: 01/23/2020/11:05:01 AM    Final      Medications:   . sodium chloride    . sodium chloride     . Chlorhexidine Gluconate Cloth  6 each Topical Q0600   . cinacalcet  60 mg Oral Daily  . feeding supplement  237 mL Oral Daily  . furosemide  40 mg Intravenous Q12H  . hydrALAZINE  50 mg Oral Q8H  . isosorbide mononitrate  30 mg  Oral Daily  . losartan  150 mg Oral Daily  . metoprolol succinate  100 mg Oral Daily  . multivitamin  1 tablet Oral q1800  . pantoprazole  40 mg Oral Daily  . sertraline  200 mg Oral Daily  . sevelamer carbonate  2,400 mg Oral TID WC  . traZODone  50 mg Oral QHS  . warfarin  5 mg Oral ONCE-1600  . Warfarin - Pharmacist Dosing Inpatient   Does not apply q1600  . zolpidem  5 mg Oral QHS   sodium chloride, sodium chloride, acetaminophen **OR** acetaminophen, alteplase, heparin, HYDROcodone-acetaminophen, labetalol, lidocaine (PF), lidocaine-prilocaine, ondansetron **OR** ondansetron (ZOFRAN) IV, pentafluoroprop-tetrafluoroeth, polyethylene glycol, traZODone  Assessment/ Plan:  65 y.o. female with past medical history of ESRD on HD MWF, hypertension, chronic systolic heart failure ejection fraction 40 to 45%, anemia of chronic kidney disease, secondary hyperparathyroidism, gout, anxiety, depression who presented with acute respiratory failure.  [facility]. [place]/MWF/EDW 62.5.   1.  ESRD on HD MWF.  Patient underwent dialysis on 01/21/2020 as an outpatient and completed her treatment.  Came back to the emergency department on 01/22/2020 with increasing shortness of breath.  Underwent emergent dialysis.  No immediate need for dialysis today.  We will plan for dialysis tomorrow.  2.  Acute respiratory failure.  Weaned off of BiPAP and now on nasal cannula.  Perform additional ultrafiltration with dialysis tomorrow for volume removal.  3.  Anemia of chronic kidney disease.  Hemoglobin 10.7.  Hold off on Epogen for now.  4.  Secondary hyperparathyroidism.  Maintain the patient on Cinacalcet 60 mg daily as well as Renvela 24 mg p.o. 3 times daily with meals.   LOS: 0 Kalenna Millett 1/16/20224:00 PM

## 2020-01-23 NOTE — Progress Notes (Addendum)
ANTICOAGULATION CONSULT NOTE - Initial Consult  Pharmacy Consult for Warfarin  Indication: Hx of  DVT    Allergies  Allergen Reactions  . Gabapentin Other (See Comments)    Seizure  . Adhesive [Tape] Itching    Silk tape is ok to use.    Patient Measurements: Height: 5\' 5"  (165.1 cm) Weight: 52.3 kg (115 lb 4.8 oz) IBW/kg (Calculated) : 57  Vital Signs: Temp: 97.6 F (36.4 C) (01/15 2126) Temp Source: Axillary (01/15 2126) BP: 120/83 (01/16 0700) Pulse Rate: 76 (01/16 0700)  Labs: Recent Labs    01/22/20 2128 01/22/20 2325 01/23/20 0150 01/23/20 0437  HGB 10.6*  --  10.7* 10.7*  HCT 32.4*  --  33.1* 32.3*  PLT 206  --  224 204  APTT  --   --   --  36  LABPROT  --   --   --  27.1*  INR  --   --   --  2.6*  CREATININE 8.13*  --  5.73* 5.73*  TROPONINIHS 44* 57*  --   --     Estimated Creatinine Clearance: 8 mL/min (A) (by C-G formula based on SCr of 5.73 mg/dL (H)).   Medical History: Past Medical History:  Diagnosis Date  . Anemia   . Anxiety   . Breast cancer ([place]) 01/2016   bilateral  . CHF (congestive heart failure) ([place])   . Chronic kidney disease   . Depression   . Dialysis patient ([facility])   . DVT (deep venous thrombosis) ([facility])    left leg  . DVT (deep venous thrombosis) ([place]) 1985   right thigh  . Dysrhythmia   . Gout   . Headache   . HTN (hypertension)   . Hypertension   . Parathyroid abnormality ([place])   . Parathyroid disease ([place])   . Pneumonia 12/2015  . Psoriasis   . Renal insufficiency   . Sickle cell trait ([facility])    traits    Assessment: 65 yo female with PMH of CHF, DVT, ESRD on HD, HTN admitted with acute on chronic systolic CHF and hypertensive urgency. Pharmacy consulted for warfarin dosing and monitoring. INR therapeutic on admission @ 2.6.   Home Regimen: Warfarin 5mg : M,W,F,Sa,Su                              Warfarin 7.5mg : T,Th  DATE INR DOSE 1/16 2.6   Goal of Therapy:  INR 2-3 Monitor platelets by anticoagulation  protocol: Yes   Plan:  Will order patient's home dose of warfarin 5mg  this afternoon.  INR ordered with AM labs. CBC at least every 3 days per protocol.  Pharmacy will continue to monitor and order warfarin based in INR levels.   Pernell Dupre, PharmD, BCPS Clinical Pharmacist 01/23/2020 7:53 AM

## 2020-01-23 NOTE — H&P (Signed)
[place]   PATIENT NAME: Teresa Cook    MR#:  [id number]  DATE OF BIRTH:  Jun 05, 1955  DATE OF ADMISSION:  01/22/2020  PRIMARY CARE PHYSICIAN: Murlean Iba, MD   REQUESTING/REFERRING PHYSICIAN: Conni Slipper, MD  CHIEF COMPLAINT:   Chief Complaint  Patient presents with  . Respiratory Distress    HISTORY OF PRESENT ILLNESS:  Teresa Cook  is a 65 y.o. African-American female with a known history of CHF, end-stage renal disease on hemodialysis, hypertension, gout, and anxiety and depression, who presented to the emergency room with acute onset of worsening dyspnea today with associated dry cough and wheezing.  She had hemodialysis yesterday and has not missed any sessions.  She denied any chest pain or palpitations however admits to mild tightness with her dyspnea.  She has lower extremity edema.  No abdominal pain or melena or bright red bleeding per rectum.  No other bleeding diathesis.  Upon presentation to the ER, blood pressure was 164/134 with a respiratory rate of 27 and pulse ox was 94% on 15 L of O2 by CPAP and later on was placed on BiPAP with improvement of pulse oximetry to 99-100%.  COVID-19 PCR came back negative.  Other labs revealed a BUN of 47 with creatinine of 8.13 and potassium of 4.5.  BNP was 871.9 and high-sensitivity troponin was 44 and later 57.  Lactic acid was 2.5 and later 1.3.  CBC showed mild leukocytosis of 11.7 and anemia close to baseline.  Influenza antigens came back negative.  Chest x-ray showed mild to moderate severity interstitial edema.  The patient was given 80 mg of IV Lasix.  Dr. Holley Raring was notified about the patient.  She will be admitted to a progressive unit bed for further evaluation and management.  PAST MEDICAL HISTORY:   Past Medical History:  Diagnosis Date  . Anemia   . Anxiety   . Breast cancer ([place]) 01/2016   bilateral  . CHF (congestive heart failure) ([place])   . Chronic kidney disease   . Depression   .  Dialysis patient ([place])   . DVT (deep venous thrombosis) ([facility])    left leg  . DVT (deep venous thrombosis) ([place]) 1985   right thigh  . Dysrhythmia   . Gout   . Headache   . HTN (hypertension)   . Hypertension   . Parathyroid abnormality ([place])   . Parathyroid disease ([place])   . Pneumonia 12/2015  . Psoriasis   . Renal insufficiency   . Sickle cell trait ([place])    traits    PAST SURGICAL HISTORY:   Past Surgical History:  Procedure Laterality Date  . ABDOMINAL HYSTERECTOMY  1980  . APPENDECTOMY    . BREAST BIOPSY Left 10/28/2013   benign  . BREAST EXCISIONAL BIOPSY Left 2002   benign  . INSERTION OF DIALYSIS CATHETER  2014  . LIPOMA EXCISION N/A 01/23/2016   Procedure: EXCISION LIPOMA;  Surgeon: Hubbard Robinson, MD;  Location: [facility];  Service: General;  Laterality: N/A;  . MASTECTOMY W/ SENTINEL NODE BIOPSY Bilateral 01/23/2016   Procedure: bilateral MASTECTOMY WITH  bilateral SENTINEL LYMPH NODE BIOPSY possible left axillary node dissection forehead lipoma removal;  Surgeon: Hubbard Robinson, MD;  Location: [facility];  Service: General;  Laterality: Bilateral;  . PARTIAL HYSTERECTOMY    . PERIPHERAL VASCULAR CATHETERIZATION N/A 12/25/2015   Procedure: Dialysis/Perma Catheter Insertion;  Surgeon: Algernon Huxley, MD;  Location: [facility];  Service: Cardiovascular;  Laterality: N/A;  . PERIPHERAL VASCULAR CATHETERIZATION Left 01/22/2016   Procedure: Dialysis/Perma Catheter Insertion;  Surgeon: Algernon Huxley, MD;  Location: [facility];  Service: Cardiovascular;  Laterality: Left;  . PERIPHERAL VASCULAR CATHETERIZATION N/A 01/26/2016   Procedure: Dialysis/Perma Catheter Insertion;  Surgeon: Katha Cabal, MD;  Location: [facility];  Service: Cardiovascular;  Laterality: N/A;  . PORT-A-CATH REMOVAL N/A 12/20/2015   Procedure: REMOVAL PORT-A-CATH;  Surgeon: Hubbard Robinson, MD;  Location: [facility];  Service: General;  Laterality: N/A;  left    .  PORTACATH PLACEMENT Left 08/21/2015   Procedure: INSERTION PORT-A-CATH;  Surgeon: Hubbard Robinson, MD;  Location: [facility];  Service: General;  Laterality: Left;  . REMOVAL OF A DIALYSIS CATHETER  2017    SOCIAL HISTORY:   Social History   Tobacco Use  . Smoking status: Never Smoker  . Smokeless tobacco: Never Used  Substance Use Topics  . Alcohol use: No    Alcohol/week: 0.0 standard drinks    FAMILY HISTORY:   Family History  Problem Relation Age of Onset  . Stroke Mother   . CVA Mother   . Hypertension Mother   . Hypertension Father   . Hypertension Sister   . Diabetes Sister   . Cancer Sister 2       Breast  . Stroke Brother   . Hypertension Brother   . Breast cancer Maternal Aunt 70    DRUG ALLERGIES:   Allergies  Allergen Reactions  . Gabapentin Other (See Comments)    Seizure  . Adhesive [Tape] Itching    Silk tape is ok to use.    REVIEW OF SYSTEMS:   As per history of present illness. All pertinent systems were reviewed above. Constitutional, HEENT, cardiovascular, respiratory, GI, GU, musculoskeletal, neuro, psychiatric, endocrine, integumentary and hematologic systems were reviewed and are otherwise negative/unremarkable except for positive findings mentioned above in the HPI.   MEDICATIONS AT HOME:   Prior to Admission medications   Medication Sig Start Date End Date Taking? Authorizing Provider  b complex-vitamin c-folic acid (NEPHRO-VITE) 0.8 MG TABS tablet Take 1 tablet by mouth daily.    [provider]  cinacalcet (SENSIPAR) 60 MG tablet Take 60 mg by mouth daily.    [provider]  feeding supplement, ENSURE ENLIVE, (ENSURE ENLIVE) LIQD Take 237 mLs by mouth 3 (three) times daily between meals. Patient taking differently: Take 237 mLs by mouth daily.  12/27/15   [name], MD  hydrALAZINE (APRESOLINE) 50 MG tablet Take 1 tablet (50 mg total) by mouth every 8 (eight) hours. 09/14/16   Dustin Flock, MD   HYDROcodone-acetaminophen (NORCO/VICODIN) 5-325 MG tablet Take 1 tablet by mouth every 4 (four) hours as needed for moderate pain. 01/14/17   Epifanio Lesches, MD  isosorbide mononitrate (IMDUR) 30 MG 24 hr tablet Take 1 tablet (30 mg total) by mouth daily. 09/14/16   Dustin Flock, MD  losartan (COZAAR) 50 MG tablet Take 3 tablets (150 mg total) by mouth daily. 09/14/16   Dustin Flock, MD  metoprolol succinate (TOPROL-XL) 100 MG 24 hr tablet Take 1 tablet (100 mg total) by mouth daily. Take with or immediately following a meal. 09/14/16   Dustin Flock, MD  pantoprazole (PROTONIX) 40 MG tablet Take 1 tablet (40 mg total) by mouth daily. 01/14/17   Epifanio Lesches, MD  sertraline (ZOLOFT) 50 MG tablet Take 3 tablets (150 mg total) by mouth daily. Patient taking differently: Take 200 mg by mouth daily.  03/01/16   [name], MD  sevelamer carbonate (RENVELA) 800 MG tablet Take 2,400 mg by mouth 3 (three) times daily with meals. Tid with meals and 1 tablet with snacks once a day    [provider]  traZODone (DESYREL) 50 MG tablet Take 50 mg by mouth at bedtime. May increase to 2 tablets if needed.    [provider]  warfarin (COUMADIN) 5 MG tablet take 5 mg on friday, saturday, sunday. and 7.5 mg on monday, tuesday, wednesday, thursday 11/03/13   [provider]  zolpidem (AMBIEN) 5 MG tablet Take 1 tablet (5 mg total) by mouth at bedtime. Patient taking differently: Take 10 mg by mouth at bedtime.  03/01/16   [name], MD      VITAL SIGNS:  Blood pressure (!) 162/120, pulse 80, temperature 97.6 F (36.4 C), temperature source Axillary, resp. rate (!) 23, height 5\' 5"  (1.651 m), weight 52.3 kg, SpO2 99 %.  PHYSICAL EXAMINATION:  Physical Exam  GENERAL:  65 y.o.-year-old African-American female patient lying in the bed with mild respiratory distress with conversational dyspnea.   EYES: Pupils equal, round, reactive to light and  accommodation. No scleral icterus. Extraocular muscles intact.  HEENT: Head atraumatic, normocephalic. Oropharynx and nasopharynx clear.  NECK:  Supple, no jugular venous distention. No thyroid enlargement, no tenderness.  LUNGS: Diminished bibasal breath sounds with bibasal rales. CARDIOVASCULAR: Regular rate and rhythm, S1, S2 normal. No murmurs, rubs, or gallops.  ABDOMEN: Soft, nondistended, nontender. Bowel sounds present. No organomegaly or mass.  EXTREMITIES: 1+ bilateral lower extremity pitting edema, with no cyanosis, or clubbing.  NEUROLOGIC: Cranial nerves II through XII are intact. Muscle strength 5/5 in all extremities. Sensation intact. Gait not checked.  PSYCHIATRIC: The patient is alert and oriented x 3.  Normal affect and good eye contact. SKIN: No obvious rash, lesion, or ulcer.   LABORATORY PANEL:   CBC Recent Labs  Lab 01/22/20 2128  WBC 11.7*  HGB 10.6*  HCT 32.4*  PLT 206   ------------------------------------------------------------------------------------------------------------------  Chemistries  Recent Labs  Lab 01/22/20 2128  NA 141  K 4.5  CL 102  CO2 22  GLUCOSE 122*  BUN 47*  CREATININE 8.13*  CALCIUM 9.1  AST 31  ALT 27  ALKPHOS 113  BILITOT 0.6   ------------------------------------------------------------------------------------------------------------------  Cardiac Enzymes No results for input(s): TROPONINI in the last 168 hours. ------------------------------------------------------------------------------------------------------------------  RADIOLOGY:  DG Chest Portable 1 View  Result Date: 01/22/2020 CLINICAL DATA:  Respiratory distress. EXAM: PORTABLE CHEST 1 VIEW COMPARISON:  January 12, 2017 FINDINGS: There is stable left-sided venous catheter positioning. Mild to moderate severity diffusely increased lung markings are seen. There is no evidence of a pleural effusion or pneumothorax. The heart size and mediastinal contours  are within normal limits. There is marked severity calcification of the thoracic aorta. Moderate severity levoscoliosis of the lower thoracic spine is seen. IMPRESSION: Mild to moderate severity interstitial edema. Electronically Signed   By: Virgina Norfolk M.D.   On: 01/22/2020 21:59      IMPRESSION AND PLAN:   1.  Acute on chronic systolic CHF likely secondary to fluid overload with end-stage renal disease. - The patient will be admitted to a progressive unit bed. - We will place on IV diuresis with Lasix. - We will follow serial troponin I's. - We will obtain a 2D echo.  Her last 2D echo was in 2018 revealing EF of 30 to 35% with mild aortic regurgitation, moderate mitral regurgitation and mild left  atrial dilatation. - We will obtain an nephrology consultation to follow-up on hemodialysis. - I notified Dr. Holley Raring about the patient.  2. Hypertensive urgency. - The patient will be placed on as needed IV labetalol and will continue her antihypertensives.  3.  GERD. - PPI therapy will be resumed.  4.  Depression and anxiety. - We will continue Zoloft and trazodone.  5.  History of DVT and DVT prophylaxis. -We will continue Coumadin and check INR.    All the records are reviewed and case discussed with ED provider. The plan of care was discussed in details with the patient (and family). I answered all questions. The patient agreed to proceed with the above mentioned plan. Further management will depend upon hospital course.   CODE STATUS: Full code  Status is: Inpatient  Remains inpatient appropriate because:Ongoing diagnostic testing needed not appropriate for outpatient work up, Unsafe d/c plan, IV treatments appropriate due to intensity of illness or inability to take PO and Inpatient level of care appropriate due to severity of illness   Dispo: The patient is from: Home              Anticipated d/c is to: Home              Anticipated d/c date is: 3 days               Patient currently is not medically stable to d/c.    TOTAL TIME TAKING CARE OF THIS PATIENT: 55 minutes.    Christel Mormon M.D on 01/23/2020 at 12:21 AM  [facility]   From 7 PM-7 AM, contact night-coverage [email or web address]  CC: Primary care physician; Murlean Iba, MD

## 2020-01-23 NOTE — Progress Notes (Signed)
Pt weaned off Bipap & placed on [place] at 6 lpm. O2 sat currently 91-93. No c/o SOB.

## 2020-01-23 NOTE — Progress Notes (Signed)
PROGRESS NOTE    Teresa Cook  VPX:[id number] DOB: 02-07-1955 DOA: 01/22/2020 PCP: Murlean Iba, MD    Brief Narrative:  65 y.o. African-American female with a known history of CHF, end-stage renal disease on hemodialysis, hypertension, gout, and anxiety and depression, who presented to the emergency room with acute onset of worsening dyspnea today with associated dry cough and wheezing.  She had hemodialysis yesterday and has not missed any sessions.  She denied any chest pain or palpitations however admits to mild tightness with her dyspnea.  She has lower extremity edema.  No abdominal pain or melena or bright red bleeding per rectum.  No other bleeding diathesis.  Upon presentation to the ER, blood pressure was 164/134 with a respiratory rate of 27 and pulse ox was 94% on 15 L of O2 by CPAP and later on was placed on BiPAP with improvement of pulse oximetry to 99-100%.  COVID-19 PCR came back negative.  Other labs revealed a BUN of 47 with creatinine of 8.13 and potassium of 4.5.  BNP was 871.9 and high-sensitivity troponin was 44 and later 57.  Lactic acid was 2.5 and later 1.3.  CBC showed mild leukocytosis of 11.7 and anemia close to baseline.  Influenza antigens came back negative.  Chest x-ray showed mild to moderate severity interstitial edema.  Assessment & Plan:   Active Problems:   Acute CHF ([place])  1.  Acute on chronic systolic CHF likely secondary to fluid overload with end-stage renal disease. - Was continued on IV diuresis with Lasix. - Her last 2D echo was in 2018 revealing EF of 30 to 35% with mild aortic regurgitation, moderate mitral regurgitation and mild left atrial dilatation. -repeat 2d echo obtained and reviewed. EF of 40-45% - Nephrology consulted. Plan for HD tomorrow - Follow bmet  2. Hypertensive urgency. - The patient will be placed on as needed IV labetalol and will continue her antihypertensives.  3.  GERD. - Continue with PPI   4.  Depression  and anxiety. - Continue Zoloft and trazodone.  5.  History of DVT and DVT prophylaxis. -Cont Coumadin and check INR per pharmacy dosing  DVT prophylaxis: Coumadin Code Status: Full Family Communication: Pt in room, family not at bedside  Status is: Inpatient  Remains inpatient appropriate because:Unsafe d/c plan and Inpatient level of care appropriate due to severity of illness   Dispo: The patient is from: Home              Anticipated d/c is to: Home              Anticipated d/c date is: 2 days              Patient currently is not medically stable to d/c.       Consultants:   Nephrology  Procedures:     Antimicrobials: Anti-infectives (From admission, onward)   None       Subjective: Feeling better. Denies chest pain  Objective: Vitals:   01/23/20 0930 01/23/20 0945 01/23/20 1358 01/23/20 1555  BP: 127/87 (!) 128/96 130/77 (!) 136/99  Pulse: (!) 104 86 (!) 110 81  Resp: (!) 23 20 18  (!) 22  Temp:   98.5 F (36.9 C) 98.1 F (36.7 C)  TempSrc:   Oral Oral  SpO2: 96% 100% 90% 92%  Weight:      Height:        Intake/Output Summary (Last 24 hours) at 01/23/2020 1645 Last data filed at 01/23/2020 0315 Gross per 24 hour  Intake -  Output 1830 ml  Net -1830 ml   Filed Weights   01/22/20 2114  Weight: 52.3 kg    Examination:  General exam: Appears calm and comfortable  Respiratory system: Clear to auscultation. Respiratory effort normal. Cardiovascular system: S1 & S2 heard, Regular Gastrointestinal system: Abdomen is nondistended, soft and nontender. No organomegaly or masses felt. Normal bowel sounds heard. Central nervous system: Alert and oriented. No focal neurological deficits. Extremities: Symmetric 5 x 5 power. Skin: No rashes, lesions Psychiatry: Judgement and insight appear normal. Mood & affect appropriate.   Data Reviewed: I have personally reviewed following labs and imaging studies  CBC: Recent Labs  Lab 01/22/20 2128  01/23/20 0150 01/23/20 0437  WBC 11.7* 10.1 10.1  NEUTROABS 9.9*  --   --   HGB 10.6* 10.7* 10.7*  HCT 32.4* 33.1* 32.3*  MCV 89.3 87.3 86.8  PLT 206 224 347   Basic Metabolic Panel: Recent Labs  Lab 01/22/20 2128 01/23/20 0150 01/23/20 0437  NA 141  --  140  K 4.5  --  3.7  CL 102  --  103  CO2 22  --  25  GLUCOSE 122*  --  98  BUN 47*  --  27*  CREATININE 8.13* 5.73* 5.73*  CALCIUM 9.1  --  8.9  PHOS  --  2.9  --    GFR: Estimated Creatinine Clearance: 8 mL/min (A) (by C-G formula based on SCr of 5.73 mg/dL (H)). Liver Function Tests: Recent Labs  Lab 01/22/20 2128 01/23/20 0437  AST 31 26  ALT 27 26  ALKPHOS 113 91  BILITOT 0.6 0.8  PROT 8.0 7.2  ALBUMIN 4.0 3.7   No results for input(s): LIPASE, AMYLASE in the last 168 hours. No results for input(s): AMMONIA in the last 168 hours. Coagulation Profile: Recent Labs  Lab 01/23/20 0437  INR 2.6*   Cardiac Enzymes: No results for input(s): CKTOTAL, CKMB, CKMBINDEX, TROPONINI in the last 168 hours. BNP (last 3 results) No results for input(s): PROBNP in the last 8760 hours. HbA1C: No results for input(s): HGBA1C in the last 72 hours. CBG: No results for input(s): GLUCAP in the last 168 hours. Lipid Profile: No results for input(s): CHOL, HDL, LDLCALC, TRIG, CHOLHDL, LDLDIRECT in the last 72 hours. Thyroid Function Tests: No results for input(s): TSH, T4TOTAL, FREET4, T3FREE, THYROIDAB in the last 72 hours. Anemia Panel: No results for input(s): VITAMINB12, FOLATE, FERRITIN, TIBC, IRON, RETICCTPCT in the last 72 hours. Sepsis Labs: Recent Labs  Lab 01/22/20 2128 01/22/20 2325  LATICACIDVEN 2.5* 1.3    Recent Results (from the past 240 hour(s))  Resp panel by RT-PCR (RSV, Flu A&B, Covid) Nasopharyngeal Swab     Status: None   Collection Time: 01/22/20  9:28 PM   Specimen: Nasopharyngeal Swab; Nasopharyngeal(NP) swabs in vial transport medium  Result Value Ref Range Status   SARS Coronavirus 2 by  RT PCR NEGATIVE NEGATIVE Final    Comment: (NOTE) SARS-CoV-2 target nucleic acids are NOT DETECTED.  The SARS-CoV-2 RNA is generally detectable in upper respiratory specimens during the acute phase of infection. The lowest concentration of SARS-CoV-2 viral copies this assay can detect is 138 copies/mL. A negative result does not preclude SARS-Cov-2 infection and should not be used as the sole basis for treatment or other patient management decisions. A negative result may occur with  improper specimen collection/handling, submission of specimen other than nasopharyngeal swab, presence of viral mutation(s) within the areas targeted by this assay, and  inadequate number of viral copies(<138 copies/mL). A negative result must be combined with clinical observations, patient history, and epidemiological information. The expected result is Negative.  Fact Sheet for Patients:  EntrepreneurPulse.com.au  Fact Sheet for Healthcare Providers:  IncredibleEmployment.be  This test is no t yet approved or cleared by the [place] FDA and  has been authorized for detection and/or diagnosis of SARS-CoV-2 by FDA under an Emergency Use Authorization (EUA). This EUA will remain  in effect (meaning this test can be used) for the duration of the COVID-19 declaration under Section 564(b)(1) of the Act, 21 U.S.C.section 360bbb-3(b)(1), unless the authorization is terminated  or revoked sooner.       Influenza A by PCR NEGATIVE NEGATIVE Final   Influenza B by PCR NEGATIVE NEGATIVE Final    Comment: (NOTE) The Xpert Xpress SARS-CoV-2/FLU/RSV plus assay is intended as an aid in the diagnosis of influenza from Nasopharyngeal swab specimens and should not be used as a sole basis for treatment. Nasal washings and aspirates are unacceptable for Xpert Xpress SARS-CoV-2/FLU/RSV testing.  Fact Sheet for Patients: EntrepreneurPulse.com.au  Fact Sheet  for Healthcare Providers: IncredibleEmployment.be  This test is not yet approved or cleared by the [place] FDA and has been authorized for detection and/or diagnosis of SARS-CoV-2 by FDA under an Emergency Use Authorization (EUA). This EUA will remain in effect (meaning this test can be used) for the duration of the COVID-19 declaration under Section 564(b)(1) of the Act, 21 U.S.C. section 360bbb-3(b)(1), unless the authorization is terminated or revoked.     Resp Syncytial Virus by PCR NEGATIVE NEGATIVE Final    Comment: (NOTE) Fact Sheet for Patients: EntrepreneurPulse.com.au  Fact Sheet for Healthcare Providers: IncredibleEmployment.be  This test is not yet approved or cleared by the [place] FDA and has been authorized for detection and/or diagnosis of SARS-CoV-2 by FDA under an Emergency Use Authorization (EUA). This EUA will remain in effect (meaning this test can be used) for the duration of the COVID-19 declaration under Section 564(b)(1) of the Act, 21 U.S.C. section 360bbb-3(b)(1), unless the authorization is terminated or revoked.  Performed at [facility], [address] [place], [place] [address]      Radiology Studies: DG Chest Portable 1 View  Result Date: 01/22/2020 CLINICAL DATA:  Respiratory distress. EXAM: PORTABLE CHEST 1 VIEW COMPARISON:  January 12, 2017 FINDINGS: There is stable left-sided venous catheter positioning. Mild to moderate severity diffusely increased lung markings are seen. There is no evidence of a pleural effusion or pneumothorax. The heart size and mediastinal contours are within normal limits. There is marked severity calcification of the thoracic aorta. Moderate severity levoscoliosis of the lower thoracic spine is seen. IMPRESSION: Mild to moderate severity interstitial edema. Electronically Signed   By: Virgina Norfolk M.D.   On: 01/22/2020 21:59    ECHOCARDIOGRAM COMPLETE  Result Date: 01/23/2020    ECHOCARDIOGRAM REPORT   Patient Name:   Teresa Cook Date of Exam: 01/23/2020 Medical Rec #:  [id number]       Height:       65.0 in Accession #:    [id number]      Weight:       115.3 lb Date of Birth:  12-08-1955       BSA:          1.565 m Patient Age:    22 years        BP:           128/96 mmHg Patient Gender:  F               HR:           85 bpm. Exam Location:  [facility] Procedure: 2D Echo, Color Doppler and Cardiac Doppler Indications:     O29.47 CHF-Acute Systolic  History:         Patient has prior history of Echocardiogram examinations. CHF,                  CKD; Risk Factors:Hypertension.  Sonographer:     Charmayne Sheer RDCS (AE) Referring Phys:  [id number] Arvella Merles Fate Diagnosing Phys: Bartholome Bill MD  Sonographer Comments: Suboptimal subcostal window. Image acquisition challenging due to respiratory motion. IMPRESSIONS  1. Left ventricular ejection fraction, by estimation, is 40 to 45%. Left ventricular ejection fraction by PLAX is 43 %. The left ventricle has mildly decreased function. The left ventricle demonstrates regional wall motion abnormalities (see scoring diagram/findings for description). There is moderate left ventricular hypertrophy. Left ventricular diastolic parameters were normal.  2. Right ventricular systolic function is normal. The right ventricular size is mildly enlarged.  3. Left atrial size was mildly dilated.  4. The mitral valve is grossly normal. Mild mitral valve regurgitation.  5. The aortic valve is calcified. Aortic valve regurgitation is trivial. FINDINGS  Left Ventricle: Left ventricular ejection fraction, by estimation, is 40 to 45%. Left ventricular ejection fraction by PLAX is 43 %. The left ventricle has mildly decreased function. The left ventricle demonstrates regional wall motion abnormalities. The left ventricular internal cavity size was normal in size. There is moderate left ventricular hypertrophy. Left  ventricular diastolic parameters were normal.  LV Wall Scoring: The mid anteroseptal segment and mid inferolateral segment are hypokinetic. Right Ventricle: The right ventricular size is mildly enlarged. No increase in right ventricular wall thickness. Right ventricular systolic function is normal. Left Atrium: Left atrial size was mildly dilated. Right Atrium: Right atrial size was normal in size. Pericardium: There is no evidence of pericardial effusion. Mitral Valve: The mitral valve is grossly normal. Mild mitral valve regurgitation. MV peak gradient, 3.6 mmHg. The mean mitral valve gradient is 2.0 mmHg. Tricuspid Valve: The tricuspid valve is not well visualized. Tricuspid valve regurgitation is trivial. Aortic Valve: The aortic valve is calcified. Aortic valve regurgitation is trivial. Aortic valve mean gradient measures 3.0 mmHg. Aortic valve peak gradient measures 4.8 mmHg. Aortic valve area, by VTI measures 2.43 cm. Pulmonic Valve: The pulmonic valve was not well visualized. Pulmonic valve regurgitation is trivial. Aorta: The aortic root was not well visualized. IAS/Shunts: The interatrial septum was not well visualized.  LEFT VENTRICLE PLAX 2D LV EF:         Left            Diastology                ventricular     LV e' medial:    3.92 cm/s                ejection        LV E/e' medial:  22.1                fraction by     LV e' lateral:   6.20 cm/s                PLAX is 43      LV E/e' lateral: 14.0                %.  LVIDd:         3.40 cm LVIDs:         2.70 cm LV PW:         0.90 cm LV IVS:        0.80 cm LVOT diam:     2.40 cm LV SV:         49 LV SV Index:   31 LVOT Area:     4.52 cm  RIGHT VENTRICLE RV Basal diam:  2.90 cm LEFT ATRIUM             Index       RIGHT ATRIUM           Index LA diam:        3.00 cm 1.92 cm/m  RA Area:     10.80 cm LA Vol (A2C):   66.4 ml 42.42 ml/m RA Volume:   23.60 ml  15.08 ml/m LA Vol (A4C):   44.9 ml 28.68 ml/m LA Biplane Vol: 56.2 ml 35.90 ml/m  AORTIC  VALVE                   PULMONIC VALVE AV Area (Vmax):    2.71 cm    PV Vmax:       0.80 m/s AV Area (Vmean):   2.33 cm    PV Vmean:      55.500 cm/s AV Area (VTI):     2.43 cm    PV VTI:        0.150 m AV Vmax:           110.00 cm/s PV Peak grad:  2.6 mmHg AV Vmean:          87.300 cm/s PV Mean grad:  1.0 mmHg AV VTI:            0.201 m AV Peak Grad:      4.8 mmHg AV Mean Grad:      3.0 mmHg LVOT Vmax:         65.90 cm/s LVOT Vmean:        44.900 cm/s LVOT VTI:          0.108 m LVOT/AV VTI ratio: 0.54  AORTA Ao Root diam: 3.60 cm MITRAL VALVE MV Area (PHT): 5.50 cm    SHUNTS MV Area VTI:   2.13 cm    Systemic VTI:  0.11 m MV Peak grad:  3.6 mmHg    Systemic Diam: 2.40 cm MV Mean grad:  2.0 mmHg MV Vmax:       0.95 m/s MV Vmean:      73.1 cm/s MV Decel Time: 138 msec MV E velocity: 86.50 cm/s MV A velocity: 81.80 cm/s MV E/A ratio:  1.06 Bartholome Bill MD Electronically signed by Bartholome Bill MD Signature Date/Time: 01/23/2020/11:05:01 AM    Final     Scheduled Meds: . Chlorhexidine Gluconate Cloth  6 each Topical Q0600  . cinacalcet  60 mg Oral Daily  . feeding supplement  237 mL Oral Daily  . furosemide  40 mg Intravenous Q12H  . hydrALAZINE  50 mg Oral Q8H  . isosorbide mononitrate  30 mg Oral Daily  . losartan  150 mg Oral Daily  . metoprolol succinate  100 mg Oral Daily  . multivitamin  1 tablet Oral q1800  . pantoprazole  40 mg Oral Daily  . sertraline  200 mg Oral Daily  . sevelamer carbonate  2,400 mg Oral TID WC  . traZODone  50 mg  Oral QHS  . warfarin  5 mg Oral ONCE-1600  . Warfarin - Pharmacist Dosing Inpatient   Does not apply q1600  . zolpidem  5 mg Oral QHS   Continuous Infusions: . sodium chloride    . sodium chloride       LOS: 0 days   Marylu Lund, MD [facility] Pager On Amion  If 7PM-7AM, please contact night-coverage 01/23/2020, 4:45 PM

## 2020-01-23 NOTE — ED Notes (Signed)
Patient transitioned from Bipap to 5L [place] by respiratory therapist. Spo2 maintaining WNL.

## 2020-01-24 DIAGNOSIS — R0902 Hypoxemia: Secondary | ICD-10-CM

## 2020-01-24 LAB — PROTIME-INR
INR: 1.9 — ABNORMAL HIGH (ref 0.8–1.2)
Prothrombin Time: 21.2 seconds — ABNORMAL HIGH (ref 11.4–15.2)

## 2020-01-24 LAB — BASIC METABOLIC PANEL
Anion gap: 13 (ref 5–15)
BUN: 52 mg/dL — ABNORMAL HIGH (ref 8–23)
CO2: 25 mmol/L (ref 22–32)
Calcium: 9.2 mg/dL (ref 8.9–10.3)
Chloride: 102 mmol/L (ref 98–111)
Creatinine, Ser: 8.84 mg/dL — ABNORMAL HIGH (ref 0.44–1.00)
GFR, Estimated: 5 mL/min — ABNORMAL LOW (ref >60)
Glucose, Bld: 91 mg/dL (ref 70–99)
Potassium: 4.5 mmol/L (ref 3.5–5.1)
Sodium: 140 mmol/L (ref 135–145)

## 2020-01-24 MED ORDER — WARFARIN SODIUM 7.5 MG PO TABS
7.5000 mg | ORAL_TABLET | Freq: Once | ORAL | Status: AC
  Administered 2020-01-24: 7.5 mg via ORAL
  Filled 2020-01-24: qty 1

## 2020-01-24 NOTE — ED Notes (Signed)
Pt declines to take Renvela at this time, states "I take this at home with meals, and I have already ate". Pt prefers to take this med with meals, as is prescribed. Informed pt is hard in ED to know when meals are coming, so pt and staff plan to have pt keep at bedside, and pt will notify staff when she takes with meal.

## 2020-01-24 NOTE — TOC Initial Note (Signed)
Transition of Care [facility]) - Progression Note    Patient Details  Name: Teresa Cook MRN: [id number] Date of Birth: May 21, 1955  [facility] CM/SW [place], [place] Phone Number: [phone] 01/24/2020, 2:41 PM  Clinical Narrative:     CSW with patient and Lisbeth Renshaw (Daughter) [phone] [facility]).  CSW explained the role of TOC in patient care.  Patient presents to [facility] ED due to SOB.  Patient stated she lives alone and is able to perform all ADLs on her own. Patient is able to perform all household necessities although she did mention "I get out of breath when I stand to cook or take a shower."  Patient stated she is able to receive and take all of her medications without issue.  Patient did mention sometimes she has difficulty swallowing her medication.  Patient's primary doctor is Dr. Clide Deutscher.  Patient would prefer to go home with home health but will consider SNF placement and does not have a preference.  CSW gave patient's Lisbeth Renshaw (Daughter) [phone] [facility]) medicare.gov website information.  CSW explained to patient and [name] the SNF placement process and potential timeline.  CSW also mentioned the process for home health.  Patient and Ms.Primus Bravo verbalized understanding.  CSW was unable to complete FL2 due to [facility] does not have correct information for patient and they were closed today and CSW was unable to speak with someone to confirm patient information.  Expected Discharge Plan: [place] Barriers to Discharge: Continued Medical Work up  Expected Discharge Plan and Services Expected Discharge Plan: [place] In-house Referral: Clinical Social Work   Post Acute Care Choice: [place] arrangements for the past 2 months: Mobile Home                                       Social Determinants of Health (SDOH) Interventions    Readmission Risk Interventions No  flowsheet data found.

## 2020-01-24 NOTE — ED Notes (Signed)
Pt placed on 2L via [place] due to having an O2 saturation of 88% while sleeping.

## 2020-01-24 NOTE — ED Notes (Addendum)
Took over care of pt. Pt in NAD at this time. Pt states she was supposed to have dialysis today. Message sent to Randol Kern, NP to see if dialysis is planned. Awaiting further orders. Will continue to monitor.

## 2020-01-24 NOTE — Progress Notes (Signed)
[facility]  ROUNDING NOTE   Subjective:   Patient due for hemodialysis treatment today. Resting comfortably and without obvious shortness of breath at the moment.  Objective:  Vital signs in last 24 hours:  Temp:  [98 F (36.7 C)-98.1 F (36.7 C)] 98 F (36.7 C) (01/16 1947) Pulse Rate:  [31-106] 73 (01/17 1500) Resp:  [13-25] 16 (01/17 1045) BP: (97-139)/(59-101) 123/87 (01/17 1500) SpO2:  [83 %-99 %] 93 % (01/17 1500)  Weight change:  Filed Weights   01/22/20 2114  Weight: 52.3 kg    Intake/Output: I/O last 3 completed shifts: In: -  Out: 1830 [Other:1830]   Intake/Output this shift:  No intake/output data recorded.  Physical Exam: General:  No acute distress  Head:  Normocephalic, atraumatic. Moist oral mucosal membranes  Eyes:  Anicteric  Neck:  Supple  Lungs:   Mild basilar rales  Heart:  S1S2 no rubs  Abdomen:   Soft, nontender, bowel sounds present  Extremities:  Trace peripheral edema.  Neurologic:  Awake, alert, following commands  Skin:  No lesions  Access:  Left IJ PermCath    Basic Metabolic Panel: Recent Labs  Lab 01/22/20 2128 01/23/20 0150 01/23/20 0437 01/24/20 0457  NA 141  --  140 140  K 4.5  --  3.7 4.5  CL 102  --  103 102  CO2 22  --  25 25  GLUCOSE 122*  --  98 91  BUN 47*  --  27* 52*  CREATININE 8.13* 5.73* 5.73* 8.84*  CALCIUM 9.1  --  8.9 9.2  PHOS  --  2.9  --   --     Liver Function Tests: Recent Labs  Lab 01/22/20 2128 01/23/20 0437  AST 31 26  ALT 27 26  ALKPHOS 113 91  BILITOT 0.6 0.8  PROT 8.0 7.2  ALBUMIN 4.0 3.7   No results for input(s): LIPASE, AMYLASE in the last 168 hours. No results for input(s): AMMONIA in the last 168 hours.  CBC: Recent Labs  Lab 01/22/20 2128 01/23/20 0150 01/23/20 0437  WBC 11.7* 10.1 10.1  NEUTROABS 9.9*  --   --   HGB 10.6* 10.7* 10.7*  HCT 32.4* 33.1* 32.3*  MCV 89.3 87.3 86.8  PLT 206 224 204    Cardiac Enzymes: No results for input(s):  CKTOTAL, CKMB, CKMBINDEX, TROPONINI in the last 168 hours.  BNP: Invalid input(s): POCBNP  CBG: No results for input(s): GLUCAP in the last 168 hours.  Microbiology: Results for orders placed or performed during the hospital encounter of 01/22/20  Resp panel by RT-PCR (RSV, Flu A&B, Covid) Nasopharyngeal Swab     Status: None   Collection Time: 01/22/20  9:28 PM   Specimen: Nasopharyngeal Swab; Nasopharyngeal(NP) swabs in vial transport medium  Result Value Ref Range Status   SARS Coronavirus 2 by RT PCR NEGATIVE NEGATIVE Final    Comment: (NOTE) SARS-CoV-2 target nucleic acids are NOT DETECTED.  The SARS-CoV-2 RNA is generally detectable in upper respiratory specimens during the acute phase of infection. The lowest concentration of SARS-CoV-2 viral copies this assay can detect is 138 copies/mL. A negative result does not preclude SARS-Cov-2 infection and should not be used as the sole basis for treatment or other patient management decisions. A negative result may occur with  improper specimen collection/handling, submission of specimen other than nasopharyngeal swab, presence of viral mutation(s) within the areas targeted by this assay, and inadequate number of viral copies(<138 copies/mL). A negative result must be combined  with clinical observations, patient history, and epidemiological information. The expected result is Negative.  Fact Sheet for Patients:  EntrepreneurPulse.com.au  Fact Sheet for Healthcare Providers:  IncredibleEmployment.be  This test is no t yet approved or cleared by the [place] FDA and  has been authorized for detection and/or diagnosis of SARS-CoV-2 by FDA under an Emergency Use Authorization (EUA). This EUA will remain  in effect (meaning this test can be used) for the duration of the COVID-19 declaration under Section 564(b)(1) of the Act, 21 U.S.C.section 360bbb-3(b)(1), unless the authorization is  terminated  or revoked sooner.       Influenza A by PCR NEGATIVE NEGATIVE Final   Influenza B by PCR NEGATIVE NEGATIVE Final    Comment: (NOTE) The Xpert Xpress SARS-CoV-2/FLU/RSV plus assay is intended as an aid in the diagnosis of influenza from Nasopharyngeal swab specimens and should not be used as a sole basis for treatment. Nasal washings and aspirates are unacceptable for Xpert Xpress SARS-CoV-2/FLU/RSV testing.  Fact Sheet for Patients: EntrepreneurPulse.com.au  Fact Sheet for Healthcare Providers: IncredibleEmployment.be  This test is not yet approved or cleared by the [place] FDA and has been authorized for detection and/or diagnosis of SARS-CoV-2 by FDA under an Emergency Use Authorization (EUA). This EUA will remain in effect (meaning this test can be used) for the duration of the COVID-19 declaration under Section 564(b)(1) of the Act, 21 U.S.C. section 360bbb-3(b)(1), unless the authorization is terminated or revoked.     Resp Syncytial Virus by PCR NEGATIVE NEGATIVE Final    Comment: (NOTE) Fact Sheet for Patients: EntrepreneurPulse.com.au  Fact Sheet for Healthcare Providers: IncredibleEmployment.be  This test is not yet approved or cleared by the [place] FDA and has been authorized for detection and/or diagnosis of SARS-CoV-2 by FDA under an Emergency Use Authorization (EUA). This EUA will remain in effect (meaning this test can be used) for the duration of the COVID-19 declaration under Section 564(b)(1) of the Act, 21 U.S.C. section 360bbb-3(b)(1), unless the authorization is terminated or revoked.  Performed at [facility], [address] [place],  [id number]     Coagulation Studies: Recent Labs    01/23/20 0437  LABPROT 27.1*  INR 2.6*    Urinalysis: No results for input(s): COLORURINE, LABSPEC, PHURINE, GLUCOSEU, HGBUR, BILIRUBINUR,  KETONESUR, PROTEINUR, UROBILINOGEN, NITRITE, LEUKOCYTESUR in the last 72 hours.  Invalid input(s): APPERANCEUR    Imaging: DG Chest Portable 1 View  Result Date: 01/22/2020 CLINICAL DATA:  Respiratory distress. EXAM: PORTABLE CHEST 1 VIEW COMPARISON:  January 12, 2017 FINDINGS: There is stable left-sided venous catheter positioning. Mild to moderate severity diffusely increased lung markings are seen. There is no evidence of a pleural effusion or pneumothorax. The heart size and mediastinal contours are within normal limits. There is marked severity calcification of the thoracic aorta. Moderate severity levoscoliosis of the lower thoracic spine is seen. IMPRESSION: Mild to moderate severity interstitial edema. Electronically Signed   By: Virgina Norfolk M.D.   On: 01/22/2020 21:59   ECHOCARDIOGRAM COMPLETE  Result Date: 01/23/2020    ECHOCARDIOGRAM REPORT   Patient Name:   Teresa Cook Date of Exam: 01/23/2020 Medical Rec #:  [id number]       Height:       65.0 in Accession #:    [id number]      Weight:       115.3 lb Date of Birth:  [birth year]       BSA:  1.565 m Patient Age:    65 years        BP:           128/96 mmHg Patient Gender: F               HR:           85 bpm. Exam Location:  [facility] Procedure: 2D Echo, Color Doppler and Cardiac Doppler Indications:     G25.42 CHF-Acute Systolic  History:         Patient has prior history of Echocardiogram examinations. CHF,                  CKD; Risk Factors:Hypertension.  Sonographer:     Charmayne Sheer RDCS (AE) Referring Phys:  [id number] Arvella Merles [place] Diagnosing Phys: Bartholome Bill MD  Sonographer Comments: Suboptimal subcostal window. Image acquisition challenging due to respiratory motion. IMPRESSIONS  1. Left ventricular ejection fraction, by estimation, is 40 to 45%. Left ventricular ejection fraction by PLAX is 43 %. The left ventricle has mildly decreased function. The left ventricle demonstrates regional wall motion abnormalities (see scoring  diagram/findings for description). There is moderate left ventricular hypertrophy. Left ventricular diastolic parameters were normal.  2. Right ventricular systolic function is normal. The right ventricular size is mildly enlarged.  3. Left atrial size was mildly dilated.  4. The mitral valve is grossly normal. Mild mitral valve regurgitation.  5. The aortic valve is calcified. Aortic valve regurgitation is trivial. FINDINGS  Left Ventricle: Left ventricular ejection fraction, by estimation, is 40 to 45%. Left ventricular ejection fraction by PLAX is 43 %. The left ventricle has mildly decreased function. The left ventricle demonstrates regional wall motion abnormalities. The left ventricular internal cavity size was normal in size. There is moderate left ventricular hypertrophy. Left ventricular diastolic parameters were normal.  LV Wall Scoring: The mid anteroseptal segment and mid inferolateral segment are hypokinetic. Right Ventricle: The right ventricular size is mildly enlarged. No increase in right ventricular wall thickness. Right ventricular systolic function is normal. Left Atrium: Left atrial size was mildly dilated. Right Atrium: Right atrial size was normal in size. Pericardium: There is no evidence of pericardial effusion. Mitral Valve: The mitral valve is grossly normal. Mild mitral valve regurgitation. MV peak gradient, 3.6 mmHg. The mean mitral valve gradient is 2.0 mmHg. Tricuspid Valve: The tricuspid valve is not well visualized. Tricuspid valve regurgitation is trivial. Aortic Valve: The aortic valve is calcified. Aortic valve regurgitation is trivial. Aortic valve mean gradient measures 3.0 mmHg. Aortic valve peak gradient measures 4.8 mmHg. Aortic valve area, by VTI measures 2.43 cm. Pulmonic Valve: The pulmonic valve was not well visualized. Pulmonic valve regurgitation is trivial. Aorta: The aortic root was not well visualized. IAS/Shunts: The interatrial septum was not well visualized.   LEFT VENTRICLE PLAX 2D LV EF:         Left            Diastology                ventricular     LV e' medial:    3.92 cm/s                ejection        LV E/e' medial:  22.1                fraction by     LV e' lateral:   6.20 cm/s  PLAX is 43      LV E/e' lateral: 14.0                %. LVIDd:         3.40 cm LVIDs:         2.70 cm LV PW:         0.90 cm LV IVS:        0.80 cm LVOT diam:     2.40 cm LV SV:         49 LV SV Index:   31 LVOT Area:     4.52 cm  RIGHT VENTRICLE RV Basal diam:  2.90 cm LEFT ATRIUM             Index       RIGHT ATRIUM           Index LA diam:        3.00 cm 1.92 cm/m  RA Area:     10.80 cm LA Vol (A2C):   66.4 ml 42.42 ml/m RA Volume:   23.60 ml  15.08 ml/m LA Vol (A4C):   44.9 ml 28.68 ml/m LA Biplane Vol: 56.2 ml 35.90 ml/m  AORTIC VALVE                   PULMONIC VALVE AV Area (Vmax):    2.71 cm    PV Vmax:       0.80 m/s AV Area (Vmean):   2.33 cm    PV Vmean:      55.500 cm/s AV Area (VTI):     2.43 cm    PV VTI:        0.150 m AV Vmax:           110.00 cm/s PV Peak grad:  2.6 mmHg AV Vmean:          87.300 cm/s PV Mean grad:  1.0 mmHg AV VTI:            0.201 m AV Peak Grad:      4.8 mmHg AV Mean Grad:      3.0 mmHg LVOT Vmax:         65.90 cm/s LVOT Vmean:        44.900 cm/s LVOT VTI:          0.108 m LVOT/AV VTI ratio: 0.54  AORTA Ao Root diam: 3.60 cm MITRAL VALVE MV Area (PHT): 5.50 cm    SHUNTS MV Area VTI:   2.13 cm    Systemic VTI:  0.11 m MV Peak grad:  3.6 mmHg    Systemic Diam: 2.40 cm MV Mean grad:  2.0 mmHg MV Vmax:       0.95 m/s MV Vmean:      73.1 cm/s MV Decel Time: 138 msec MV E velocity: 86.50 cm/s MV A velocity: 81.80 cm/s MV E/A ratio:  1.06 Bartholome Bill MD Electronically signed by Bartholome Bill MD Signature Date/Time: 01/23/2020/11:05:01 AM    Final      Medications:   . sodium chloride    . sodium chloride     . Chlorhexidine Gluconate Cloth  6 each Topical Q0600  . cinacalcet  60 mg Oral Daily  . feeding supplement  237 mL  Oral Daily  . furosemide  40 mg Intravenous Q12H  . hydrALAZINE  50 mg Oral Q8H  . isosorbide mononitrate  30 mg Oral Daily  . losartan  150 mg Oral Daily  . metoprolol succinate  100  mg Oral Daily  . multivitamin  1 tablet Oral q1800  . pantoprazole  40 mg Oral Daily  . sertraline  200 mg Oral Daily  . sevelamer carbonate  2,400 mg Oral TID WC  . traZODone  50 mg Oral QHS  . Warfarin - Pharmacist Dosing Inpatient   Does not apply q1600  . zolpidem  5 mg Oral QHS   sodium chloride, sodium chloride, acetaminophen **OR** acetaminophen, alteplase, heparin, HYDROcodone-acetaminophen, labetalol, lidocaine (PF), lidocaine-prilocaine, loperamide, ondansetron **OR** ondansetron (ZOFRAN) IV, pentafluoroprop-tetrafluoroeth, polyethylene glycol, traZODone  Assessment/ Plan:  65 y.o. female with past medical history of ESRD on HD MWF, hypertension, chronic systolic heart failure ejection fraction 40 to 45%, anemia of chronic kidney disease, secondary hyperparathyroidism, gout, anxiety, depression who presented with acute respiratory failure.  [facility]/N. /MWF/EDW 62.5.   1.  ESRD on HD MWF.  Patient underwent dialysis on 01/21/2020 as an outpatient and completed her treatment.  Came back to the emergency department on 01/22/2020 with increasing shortness of breath.  -Due for hemodialysis treatment today.  Orders have been prepared.  2.  Acute respiratory failure.  Initially required BiPAP when she first presented.  Now breathing comfortably.  Continue to monitor respiratory status.  3.  Anemia of chronic kidney disease.  Hold off Epogen for now.  4.  Secondary hyperparathyroidism.  Maintain the patient on Cinacalcet 60 mg daily as well as Renvela 24 mg p.o. 3 times daily with meals.  Follow-up serum phosphorus today.   LOS: 1 Whitley Patchen 1/17/20223:09 PM

## 2020-01-24 NOTE — Progress Notes (Signed)
ANTICOAGULATION CONSULT NOTE - Initial Consult  Pharmacy Consult for Warfarin  Indication: Hx of  DVT    Allergies  Allergen Reactions  . Gabapentin Other (See Comments)    Seizure  . Adhesive [Tape] Itching    Silk tape is ok to use.    Patient Measurements: Height: 5\' 5"  (165.1 cm) Weight: 52.3 kg (115 lb 4.8 oz) IBW/kg (Calculated) : 57  Vital Signs: BP: 121/82 (01/17 1600) Pulse Rate: 78 (01/17 1600)  Labs: Recent Labs    01/22/20 2128 01/22/20 2325 01/23/20 0150 01/23/20 0437 01/24/20 0457 01/24/20 1541  HGB 10.6*  --  10.7* 10.7*  --   --   HCT 32.4*  --  33.1* 32.3*  --   --   PLT 206  --  224 204  --   --   APTT  --   --   --  36  --   --   LABPROT  --   --   --  27.1*  --  21.2*  INR  --   --   --  2.6*  --  1.9*  CREATININE 8.13*  --  5.73* 5.73* 8.84*  --   TROPONINIHS 44* 57*  --   --   --   --     Estimated Creatinine Clearance: 5.2 mL/min (A) (by C-G formula based on SCr of 8.84 mg/dL (H)).   Medical History: Past Medical History:  Diagnosis Date  . Anemia   . Anxiety   . Breast cancer ([place]) 01/2016   bilateral  . CHF (congestive heart failure) ([place])   . Chronic kidney disease   . Depression   . Dialysis patient ([place])   . DVT (deep venous thrombosis) ([facility])    left leg  . DVT (deep venous thrombosis) ([place]) 1985   right thigh  . Dysrhythmia   . Gout   . Headache   . HTN (hypertension)   . Hypertension   . Parathyroid abnormality ([place])   . Parathyroid disease ([place])   . Pneumonia 12/2015  . Psoriasis   . Renal insufficiency   . Sickle cell trait ([facility])    traits    Assessment: 65 yo female with PMH of CHF, DVT, ESRD on HD, HTN admitted with acute on chronic systolic CHF and hypertensive urgency. Pharmacy consulted for warfarin dosing and monitoring. INR therapeutic on admission @ 2.6.   Home Regimen: Warfarin 5mg : M,W,F,Sa,Su                              Warfarin 7.5mg : T,Th H/H: low stable at 10.7/32.3 PLTs: WNL  204-206k  DATE INR DOSE 1/16 2.6 5mg  1/17 1.9 7.5mg   Goal of Therapy:  INR 2-3 Monitor platelets by anticoagulation protocol: Yes   Plan:  Pt received 5mg  x1 yesterday. INR drawn late with delays resulted 1.9. Will dose 7.5mg  x1 tonight and assess with AM INR. CBC at least every 3 days per protocol.  Pharmacy will continue to monitor and order warfarin based in INR levels.   Lorna Dibble, PharmD, BCPS Clinical Pharmacist 01/24/2020 4:26 PM

## 2020-01-24 NOTE — Progress Notes (Signed)
PROGRESS NOTE    Teresa Cook  GUY:[id number] DOB: 28-Sep-1955 DOA: 01/22/2020 PCP: Murlean Iba, MD    Brief Narrative:  65 y.o. African-American female with a known history of CHF, end-stage renal disease on hemodialysis, hypertension, gout, and anxiety and depression, who presented to the emergency room with acute onset of worsening dyspnea today with associated dry cough and wheezing.  She had hemodialysis yesterday and has not missed any sessions.  She denied any chest pain or palpitations however admits to mild tightness with her dyspnea.  She has lower extremity edema.  No abdominal pain or melena or bright red bleeding per rectum.  No other bleeding diathesis.  Upon presentation to the ER, blood pressure was 164/134 with a respiratory rate of 27 and pulse ox was 94% on 15 L of O2 by CPAP and later on was placed on BiPAP with improvement of pulse oximetry to 99-100%.  COVID-19 PCR came back negative.  Other labs revealed a BUN of 47 with creatinine of 8.13 and potassium of 4.5.  BNP was 871.9 and high-sensitivity troponin was 44 and later 57.  Lactic acid was 2.5 and later 1.3.  CBC showed mild leukocytosis of 11.7 and anemia close to baseline.  Influenza antigens came back negative.  Chest x-ray showed mild to moderate severity interstitial edema.  Assessment & Plan:   Active Problems:   Acute CHF ([place])  1.  Acute on chronic systolic CHF likely secondary to fluid overload with end-stage renal disease. - Was continued on IV diuresis with Lasix. - Her last 2D echo was in 2018 revealing EF of 30 to 35% with mild aortic regurgitation, moderate mitral regurgitation and mild left atrial dilatation. -repeat 2d echo obtained and reviewed. EF of 40-45% - Nephrology continues to follow. For HD today - Follow bmet trends  2. Hypertensive urgency. - The patient is continued on PRN IV labetalol  -cont home antihypertensives. -BP currently very well controlled  3.  GERD. - Continue  with PPI   4.  Depression and anxiety. - Continue Zoloft and trazodone as tolerated  5.  History of DVT and DVT prophylaxis. -Cont Coumadin and check INR per pharmacy dosing  DVT prophylaxis: Coumadin Code Status: Full Family Communication: Pt in room, family not at bedside  Status is: Inpatient  Remains inpatient appropriate because:Unsafe d/c plan and Inpatient level of care appropriate due to severity of illness   Dispo: The patient is from: Home              Anticipated d/c is to: Home              Anticipated d/c date is: 2 days              Patient currently is not medically stable to d/c.   Consultants:   Nephrology  Procedures:     Antimicrobials: Anti-infectives (From admission, onward)   None      Subjective: States feeling better today  Objective: Vitals:   01/24/20 1430 01/24/20 1500 01/24/20 1530 01/24/20 1600  BP: 120/85 123/87 115/83 121/82  Pulse: 74 73 (!) 29 78  Resp:      Temp:      TempSrc:      SpO2: 93% 93% 91% 91%  Weight:      Height:       No intake or output data in the 24 hours ending 01/24/20 1800 Filed Weights   01/22/20 2114  Weight: 52.3 kg    Examination: General exam: Awake, laying  in bed, in nad Respiratory system: Normal respiratory effort, no wheezing Cardiovascular system: regular rate, s1, s2 Gastrointestinal system: Soft, nondistended, positive BS Central nervous system: CN2-12 grossly intact, strength intact Extremities: Perfused, no clubbing Skin: Normal skin turgor, no notable skin lesions seen Psychiatry: Mood normal // no visual hallucinations   Data Reviewed: I have personally reviewed following labs and imaging studies  CBC: Recent Labs  Lab 01/22/20 2128 01/23/20 0150 01/23/20 0437  WBC 11.7* 10.1 10.1  NEUTROABS 9.9*  --   --   HGB 10.6* 10.7* 10.7*  HCT 32.4* 33.1* 32.3*  MCV 89.3 87.3 86.8  PLT 206 224 671   Basic Metabolic Panel: Recent Labs  Lab 01/22/20 2128 01/23/20 0150  01/23/20 0437 01/24/20 0457  NA 141  --  140 140  K 4.5  --  3.7 4.5  CL 102  --  103 102  CO2 22  --  25 25  GLUCOSE 122*  --  98 91  BUN 47*  --  27* 52*  CREATININE 8.13* 5.73* 5.73* 8.84*  CALCIUM 9.1  --  8.9 9.2  PHOS  --  2.9  --   --    GFR: Estimated Creatinine Clearance: 5.2 mL/min (A) (by C-G formula based on SCr of 8.84 mg/dL (H)). Liver Function Tests: Recent Labs  Lab 01/22/20 2128 01/23/20 0437  AST 31 26  ALT 27 26  ALKPHOS 113 91  BILITOT 0.6 0.8  PROT 8.0 7.2  ALBUMIN 4.0 3.7   No results for input(s): LIPASE, AMYLASE in the last 168 hours. No results for input(s): AMMONIA in the last 168 hours. Coagulation Profile: Recent Labs  Lab 01/23/20 0437 01/24/20 1541  INR 2.6* 1.9*   Cardiac Enzymes: No results for input(s): CKTOTAL, CKMB, CKMBINDEX, TROPONINI in the last 168 hours. BNP (last 3 results) No results for input(s): PROBNP in the last 8760 hours. HbA1C: No results for input(s): HGBA1C in the last 72 hours. CBG: No results for input(s): GLUCAP in the last 168 hours. Lipid Profile: No results for input(s): CHOL, HDL, LDLCALC, TRIG, CHOLHDL, LDLDIRECT in the last 72 hours. Thyroid Function Tests: No results for input(s): TSH, T4TOTAL, FREET4, T3FREE, THYROIDAB in the last 72 hours. Anemia Panel: No results for input(s): VITAMINB12, FOLATE, FERRITIN, TIBC, IRON, RETICCTPCT in the last 72 hours. Sepsis Labs: Recent Labs  Lab 01/22/20 2128 01/22/20 2325  LATICACIDVEN 2.5* 1.3    Recent Results (from the past 240 hour(s))  Resp panel by RT-PCR (RSV, Flu A&B, Covid) Nasopharyngeal Swab     Status: None   Collection Time: 01/22/20  9:28 PM   Specimen: Nasopharyngeal Swab; Nasopharyngeal(NP) swabs in vial transport medium  Result Value Ref Range Status   SARS Coronavirus 2 by RT PCR NEGATIVE NEGATIVE Final    Comment: (NOTE) SARS-CoV-2 target nucleic acids are NOT DETECTED.  The SARS-CoV-2 RNA is generally detectable in upper  respiratory specimens during the acute phase of infection. The lowest concentration of SARS-CoV-2 viral copies this assay can detect is 138 copies/mL. A negative result does not preclude SARS-Cov-2 infection and should not be used as the sole basis for treatment or other patient management decisions. A negative result may occur with  improper specimen collection/handling, submission of specimen other than nasopharyngeal swab, presence of viral mutation(s) within the areas targeted by this assay, and inadequate number of viral copies(<138 copies/mL). A negative result must be combined with clinical observations, patient history, and epidemiological information. The expected result is Negative.  Fact Sheet for  Patients:  EntrepreneurPulse.com.au  Fact Sheet for Healthcare Providers:  IncredibleEmployment.be  This test is no t yet approved or cleared by the [place] FDA and  has been authorized for detection and/or diagnosis of SARS-CoV-2 by FDA under an Emergency Use Authorization (EUA). This EUA will remain  in effect (meaning this test can be used) for the duration of the COVID-19 declaration under Section 564(b)(1) of the Act, 21 U.S.C.section 360bbb-3(b)(1), unless the authorization is terminated  or revoked sooner.       Influenza A by PCR NEGATIVE NEGATIVE Final   Influenza B by PCR NEGATIVE NEGATIVE Final    Comment: (NOTE) The Xpert Xpress SARS-CoV-2/FLU/RSV plus assay is intended as an aid in the diagnosis of influenza from Nasopharyngeal swab specimens and should not be used as a sole basis for treatment. Nasal washings and aspirates are unacceptable for Xpert Xpress SARS-CoV-2/FLU/RSV testing.  Fact Sheet for Patients: EntrepreneurPulse.com.au  Fact Sheet for Healthcare Providers: IncredibleEmployment.be  This test is not yet approved or cleared by the [place] FDA and has been  authorized for detection and/or diagnosis of SARS-CoV-2 by FDA under an Emergency Use Authorization (EUA). This EUA will remain in effect (meaning this test can be used) for the duration of the COVID-19 declaration under Section 564(b)(1) of the Act, 21 U.S.C. section 360bbb-3(b)(1), unless the authorization is terminated or revoked.     Resp Syncytial Virus by PCR NEGATIVE NEGATIVE Final    Comment: (NOTE) Fact Sheet for Patients: EntrepreneurPulse.com.au  Fact Sheet for Healthcare Providers: IncredibleEmployment.be  This test is not yet approved or cleared by the [place] FDA and has been authorized for detection and/or diagnosis of SARS-CoV-2 by FDA under an Emergency Use Authorization (EUA). This EUA will remain in effect (meaning this test can be used) for the duration of the COVID-19 declaration under Section 564(b)(1) of the Act, 21 U.S.C. section 360bbb-3(b)(1), unless the authorization is terminated or revoked.  Performed at [facility], [address] [place], [place] [address]      Radiology Studies: DG Chest Portable 1 View  Result Date: 01/22/2020 CLINICAL DATA:  Respiratory distress. EXAM: PORTABLE CHEST 1 VIEW COMPARISON:  January 12, 2017 FINDINGS: There is stable left-sided venous catheter positioning. Mild to moderate severity diffusely increased lung markings are seen. There is no evidence of a pleural effusion or pneumothorax. The heart size and mediastinal contours are within normal limits. There is marked severity calcification of the thoracic aorta. Moderate severity levoscoliosis of the lower thoracic spine is seen. IMPRESSION: Mild to moderate severity interstitial edema. Electronically Signed   By: Virgina Norfolk M.D.   On: 01/22/2020 21:59   ECHOCARDIOGRAM COMPLETE  Result Date: 01/23/2020    ECHOCARDIOGRAM REPORT   Patient Name:   Teresa Cook Date of Exam: 01/23/2020 Medical Rec #:  [id number]        Height:       65.0 in Accession #:    [id number]      Weight:       115.3 lb Date of Birth:  1955-11-20       BSA:          1.565 m Patient Age:    68 years        BP:           128/96 mmHg Patient Gender: F               HR:           85 bpm. Exam  Location:  [facility] Procedure: 2D Echo, Color Doppler and Cardiac Doppler Indications:     Z30.07 CHF-Acute Systolic  History:         Patient has prior history of Echocardiogram examinations. CHF,                  CKD; Risk Factors:Hypertension.  Sonographer:     Charmayne Sheer RDCS (AE) Referring Phys:  [id number] Arvella Merles [place] Diagnosing Phys: Bartholome Bill MD  Sonographer Comments: Suboptimal subcostal window. Image acquisition challenging due to respiratory motion. IMPRESSIONS  1. Left ventricular ejection fraction, by estimation, is 40 to 45%. Left ventricular ejection fraction by PLAX is 43 %. The left ventricle has mildly decreased function. The left ventricle demonstrates regional wall motion abnormalities (see scoring diagram/findings for description). There is moderate left ventricular hypertrophy. Left ventricular diastolic parameters were normal.  2. Right ventricular systolic function is normal. The right ventricular size is mildly enlarged.  3. Left atrial size was mildly dilated.  4. The mitral valve is grossly normal. Mild mitral valve regurgitation.  5. The aortic valve is calcified. Aortic valve regurgitation is trivial. FINDINGS  Left Ventricle: Left ventricular ejection fraction, by estimation, is 40 to 45%. Left ventricular ejection fraction by PLAX is 43 %. The left ventricle has mildly decreased function. The left ventricle demonstrates regional wall motion abnormalities. The left ventricular internal cavity size was normal in size. There is moderate left ventricular hypertrophy. Left ventricular diastolic parameters were normal.  LV Wall Scoring: The mid anteroseptal segment and mid inferolateral segment are hypokinetic. Right Ventricle: The right ventricular  size is mildly enlarged. No increase in right ventricular wall thickness. Right ventricular systolic function is normal. Left Atrium: Left atrial size was mildly dilated. Right Atrium: Right atrial size was normal in size. Pericardium: There is no evidence of pericardial effusion. Mitral Valve: The mitral valve is grossly normal. Mild mitral valve regurgitation. MV peak gradient, 3.6 mmHg. The mean mitral valve gradient is 2.0 mmHg. Tricuspid Valve: The tricuspid valve is not well visualized. Tricuspid valve regurgitation is trivial. Aortic Valve: The aortic valve is calcified. Aortic valve regurgitation is trivial. Aortic valve mean gradient measures 3.0 mmHg. Aortic valve peak gradient measures 4.8 mmHg. Aortic valve area, by VTI measures 2.43 cm. Pulmonic Valve: The pulmonic valve was not well visualized. Pulmonic valve regurgitation is trivial. Aorta: The aortic root was not well visualized. IAS/Shunts: The interatrial septum was not well visualized.  LEFT VENTRICLE PLAX 2D LV EF:         Left            Diastology                ventricular     LV e' medial:    3.92 cm/s                ejection        LV E/e' medial:  22.1                fraction by     LV e' lateral:   6.20 cm/s                PLAX is 43      LV E/e' lateral: 14.0                %. LVIDd:         3.40 cm LVIDs:         2.70 cm LV PW:  0.90 cm LV IVS:        0.80 cm LVOT diam:     2.40 cm LV SV:         49 LV SV Index:   31 LVOT Area:     4.52 cm  RIGHT VENTRICLE RV Basal diam:  2.90 cm LEFT ATRIUM             Index       RIGHT ATRIUM           Index LA diam:        3.00 cm 1.92 cm/m  RA Area:     10.80 cm LA Vol (A2C):   66.4 ml 42.42 ml/m RA Volume:   23.60 ml  15.08 ml/m LA Vol (A4C):   44.9 ml 28.68 ml/m LA Biplane Vol: 56.2 ml 35.90 ml/m  AORTIC VALVE                   PULMONIC VALVE AV Area (Vmax):    2.71 cm    PV Vmax:       0.80 m/s AV Area (Vmean):   2.33 cm    PV Vmean:      55.500 cm/s AV Area (VTI):     2.43 cm     PV VTI:        0.150 m AV Vmax:           110.00 cm/s PV Peak grad:  2.6 mmHg AV Vmean:          87.300 cm/s PV Mean grad:  1.0 mmHg AV VTI:            0.201 m AV Peak Grad:      4.8 mmHg AV Mean Grad:      3.0 mmHg LVOT Vmax:         65.90 cm/s LVOT Vmean:        44.900 cm/s LVOT VTI:          0.108 m LVOT/AV VTI ratio: 0.54  AORTA Ao Root diam: 3.60 cm MITRAL VALVE MV Area (PHT): 5.50 cm    SHUNTS MV Area VTI:   2.13 cm    Systemic VTI:  0.11 m MV Peak grad:  3.6 mmHg    Systemic Diam: 2.40 cm MV Mean grad:  2.0 mmHg MV Vmax:       0.95 m/s MV Vmean:      73.1 cm/s MV Decel Time: 138 msec MV E velocity: 86.50 cm/s MV A velocity: 81.80 cm/s MV E/A ratio:  1.06 Bartholome Bill MD Electronically signed by Bartholome Bill MD Signature Date/Time: 01/23/2020/11:05:01 AM    Final     Scheduled Meds: . Chlorhexidine Gluconate Cloth  6 each Topical Q0600  . cinacalcet  60 mg Oral Daily  . feeding supplement  237 mL Oral Daily  . furosemide  40 mg Intravenous Q12H  . hydrALAZINE  50 mg Oral Q8H  . isosorbide mononitrate  30 mg Oral Daily  . losartan  150 mg Oral Daily  . metoprolol succinate  100 mg Oral Daily  . multivitamin  1 tablet Oral q1800  . pantoprazole  40 mg Oral Daily  . sertraline  200 mg Oral Daily  . sevelamer carbonate  2,400 mg Oral TID WC  . traZODone  50 mg Oral QHS  . warfarin  7.5 mg Oral Once  . Warfarin - Pharmacist Dosing Inpatient   Does not apply q1600  . zolpidem  5 mg Oral QHS  Continuous Infusions: . sodium chloride    . sodium chloride       LOS: 1 day   Marylu Lund, MD [facility] Pager On Amion  If 7PM-7AM, please contact night-coverage 01/24/2020, 6:00 PM

## 2020-01-25 DIAGNOSIS — I5021 Acute systolic (congestive) heart failure: Secondary | ICD-10-CM

## 2020-01-25 LAB — HEPATITIS B SURFACE ANTIBODY, QUANTITATIVE: Hep B S AB Quant (Post): 9.4 m[IU]/mL — ABNORMAL LOW (ref >9.9)

## 2020-01-25 LAB — TROPONIN I (HIGH SENSITIVITY)
Troponin I (High Sensitivity): 38 ng/L — ABNORMAL HIGH (ref <18)
Troponin I (High Sensitivity): 35 ng/L — ABNORMAL HIGH (ref <18)

## 2020-01-25 LAB — BRAIN NATRIURETIC PEPTIDE: B Natriuretic Peptide: 392.7 pg/mL — ABNORMAL HIGH (ref 0.0–100.0)

## 2020-01-25 LAB — PROTIME-INR
INR: 1.8 — ABNORMAL HIGH (ref 0.8–1.2)
Prothrombin Time: 20 seconds — ABNORMAL HIGH (ref 11.4–15.2)

## 2020-01-25 LAB — PARATHYROID HORMONE, INTACT (NO CA): PTH: 344 pg/mL — ABNORMAL HIGH (ref 15–65)

## 2020-01-25 MED ORDER — WARFARIN SODIUM 7.5 MG PO TABS
7.5000 mg | ORAL_TABLET | Freq: Once | ORAL | Status: AC
  Administered 2020-01-25: 7.5 mg via ORAL
  Filled 2020-01-25: qty 1

## 2020-01-25 MED ORDER — ENSURE ENLIVE PO LIQD: 237.0000 mL | Freq: Two times a day (BID) | ORAL | Status: DC

## 2020-01-25 NOTE — Progress Notes (Signed)
PROGRESS NOTE    Teresa Cook  QIH:[id number] DOB: [birth year] DOA: 01/22/2020 PCP: Murlean Iba, MD    Brief Narrative:  65 y.o. African-American female with a known history of CHF, end-stage renal disease on hemodialysis, hypertension, gout, and anxiety and depression, who presented to the emergency room with acute onset of worsening dyspnea today with associated dry cough and wheezing.  She had hemodialysis yesterday and has not missed any sessions.  She denied any chest pain or palpitations however admits to mild tightness with her dyspnea.  She has lower extremity edema.  No abdominal pain or melena or bright red bleeding per rectum.  No other bleeding diathesis.  Upon presentation to the ER, blood pressure was 164/134 with a respiratory rate of 27 and pulse ox was 94% on 15 L of O2 by CPAP and later on was placed on BiPAP with improvement of pulse oximetry to 99-100%.  COVID-19 PCR came back negative.  Other labs revealed a BUN of 47 with creatinine of 8.13 and potassium of 4.5.  BNP was 871.9 and high-sensitivity troponin was 44 and later 57.  Lactic acid was 2.5 and later 1.3.  CBC showed mild leukocytosis of 11.7 and anemia close to baseline.  Influenza antigens came back negative.  Chest x-ray showed mild to moderate severity interstitial edema.  Assessment & Plan:   Active Problems:   Acute CHF ([place])  1.  Acute on chronic systolic CHF likely secondary to fluid overload with end-stage renal disease. - Was continued on IV diuresis with Lasix. - Her last 2D echo was in 2018 revealing EF of 30 to 35% with mild aortic regurgitation, moderate mitral regurgitation and mild left atrial dilatation. -repeat 2d echo obtained and reviewed. EF of 40-45% - Nephrology continues to follow. Pt underwent HD today. Pt was seen on HD - Follow bmet trends. Cont per Nephrology  2. Hypertensive urgency. - The patient is continued on PRN IV labetalol  -cont home antihypertensives. -BP presently  very well controlled  3.  GERD. - Continue with PPI   4.  Depression and anxiety. - Continue Zoloft and trazodone as tolerated  5.  History of DVT and DVT prophylaxis. -Cont Coumadin and check INR per pharmacy dosing  DVT prophylaxis: Coumadin Code Status: Full Family Communication: Pt in room, family not at bedside  Status is: Inpatient  Remains inpatient appropriate because:Unsafe d/c plan and Inpatient level of care appropriate due to severity of illness   Dispo: The patient is from: Home              Anticipated d/c is to: Home              Anticipated d/c date is: 2 days              Patient currently is not medically stable to d/c.   Consultants:   Nephrology  Procedures:     Antimicrobials: Anti-infectives (From admission, onward)   None      Subjective: Seen on HD. Chest pain overnight noted. Currently without chest pain. Tolerating HD  Objective: Vitals:   01/25/20 1220 01/25/20 1235 01/25/20 1245 01/25/20 1528  BP: 106/78 (!) 128/97 (!) 120/97 124/88  Pulse:    85  Resp:    16  Temp:   98.1 F (36.7 C)   TempSrc:   Oral   SpO2:   98% 100%  Weight:      Height:        Intake/Output Summary (Last 24 hours) at 01/25/2020  [place] filed at 01/25/2020 1532 Gross per 24 hour  Intake 120 ml  Output --  Net 120 ml   Filed Weights   01/22/20 2114  Weight: 52.3 kg    Examination: General exam: Conversant, in no acute distress Respiratory system: normal chest rise, clear, no audible wheezing Cardiovascular system: regular rhythm, s1-s2 Gastrointestinal system: Nondistended, nontender, pos BS Central nervous system: No seizures, no tremors Extremities: No cyanosis, no joint deformities Skin: No rashes, no pallor Psychiatry: Affect normal // no auditory hallucinations   Data Reviewed: I have personally reviewed following labs and imaging studies  CBC: Recent Labs  Lab 01/22/20 2128 01/23/20 0150 01/23/20 0437  WBC 11.7* 10.1  10.1  NEUTROABS 9.9*  --   --   HGB 10.6* 10.7* 10.7*  HCT 32.4* 33.1* 32.3*  MCV 89.3 87.3 86.8  PLT 206 224 809   Basic Metabolic Panel: Recent Labs  Lab 01/22/20 2128 01/23/20 0150 01/23/20 0437 01/24/20 0457  NA 141  --  140 140  K 4.5  --  3.7 4.5  CL 102  --  103 102  CO2 22  --  25 25  GLUCOSE 122*  --  98 91  BUN 47*  --  27* 52*  CREATININE 8.13* 5.73* 5.73* 8.84*  CALCIUM 9.1  --  8.9 9.2  PHOS  --  2.9  --   --    GFR: Estimated Creatinine Clearance: 5.2 mL/min (A) (by C-G formula based on SCr of 8.84 mg/dL (H)). Liver Function Tests: Recent Labs  Lab 01/22/20 2128 01/23/20 0437  AST 31 26  ALT 27 26  ALKPHOS 113 91  BILITOT 0.6 0.8  PROT 8.0 7.2  ALBUMIN 4.0 3.7   No results for input(s): LIPASE, AMYLASE in the last 168 hours. No results for input(s): AMMONIA in the last 168 hours. Coagulation Profile: Recent Labs  Lab 01/23/20 0437 01/24/20 1541 01/25/20 0604  INR 2.6* 1.9* 1.8*   Cardiac Enzymes: No results for input(s): CKTOTAL, CKMB, CKMBINDEX, TROPONINI in the last 168 hours. BNP (last 3 results) No results for input(s): PROBNP in the last 8760 hours. HbA1C: No results for input(s): HGBA1C in the last 72 hours. CBG: No results for input(s): GLUCAP in the last 168 hours. Lipid Profile: No results for input(s): CHOL, HDL, LDLCALC, TRIG, CHOLHDL, LDLDIRECT in the last 72 hours. Thyroid Function Tests: No results for input(s): TSH, T4TOTAL, FREET4, T3FREE, THYROIDAB in the last 72 hours. Anemia Panel: No results for input(s): VITAMINB12, FOLATE, FERRITIN, TIBC, IRON, RETICCTPCT in the last 72 hours. Sepsis Labs: Recent Labs  Lab 01/22/20 2128 01/22/20 2325  LATICACIDVEN 2.5* 1.3    Recent Results (from the past 240 hour(s))  Resp panel by RT-PCR (RSV, Flu A&B, Covid) Nasopharyngeal Swab     Status: None   Collection Time: 01/22/20  9:28 PM   Specimen: Nasopharyngeal Swab; Nasopharyngeal(NP) swabs in vial transport medium  Result  Value Ref Range Status   SARS Coronavirus 2 by RT PCR NEGATIVE NEGATIVE Final    Comment: (NOTE) SARS-CoV-2 target nucleic acids are NOT DETECTED.  The SARS-CoV-2 RNA is generally detectable in upper respiratory specimens during the acute phase of infection. The lowest concentration of SARS-CoV-2 viral copies this assay can detect is 138 copies/mL. A negative result does not preclude SARS-Cov-2 infection and should not be used as the sole basis for treatment or other patient management decisions. A negative result may occur with  improper specimen collection/handling, submission of specimen other than nasopharyngeal  swab, presence of viral mutation(s) within the areas targeted by this assay, and inadequate number of viral copies(<138 copies/mL). A negative result must be combined with clinical observations, patient history, and epidemiological information. The expected result is Negative.  Fact Sheet for Patients:  EntrepreneurPulse.com.au  Fact Sheet for Healthcare Providers:  IncredibleEmployment.be  This test is no t yet approved or cleared by the [place] FDA and  has been authorized for detection and/or diagnosis of SARS-CoV-2 by FDA under an Emergency Use Authorization (EUA). This EUA will remain  in effect (meaning this test can be used) for the duration of the COVID-19 declaration under Section 564(b)(1) of the Act, 21 U.S.C.section 360bbb-3(b)(1), unless the authorization is terminated  or revoked sooner.       Influenza A by PCR NEGATIVE NEGATIVE Final   Influenza B by PCR NEGATIVE NEGATIVE Final    Comment: (NOTE) The Xpert Xpress SARS-CoV-2/FLU/RSV plus assay is intended as an aid in the diagnosis of influenza from Nasopharyngeal swab specimens and should not be used as a sole basis for treatment. Nasal washings and aspirates are unacceptable for Xpert Xpress SARS-CoV-2/FLU/RSV testing.  Fact Sheet for  Patients: EntrepreneurPulse.com.au  Fact Sheet for Healthcare Providers: IncredibleEmployment.be  This test is not yet approved or cleared by the [place] FDA and has been authorized for detection and/or diagnosis of SARS-CoV-2 by FDA under an Emergency Use Authorization (EUA). This EUA will remain in effect (meaning this test can be used) for the duration of the COVID-19 declaration under Section 564(b)(1) of the Act, 21 U.S.C. section 360bbb-3(b)(1), unless the authorization is terminated or revoked.     Resp Syncytial Virus by PCR NEGATIVE NEGATIVE Final    Comment: (NOTE) Fact Sheet for Patients: EntrepreneurPulse.com.au  Fact Sheet for Healthcare Providers: IncredibleEmployment.be  This test is not yet approved or cleared by the [place] FDA and has been authorized for detection and/or diagnosis of SARS-CoV-2 by FDA under an Emergency Use Authorization (EUA). This EUA will remain in effect (meaning this test can be used) for the duration of the COVID-19 declaration under Section 564(b)(1) of the Act, 21 U.S.C. section 360bbb-3(b)(1), unless the authorization is terminated or revoked.  Performed at [facility], [address] [place], [place] [address]      Radiology Studies: No results found.  Scheduled Meds: . Chlorhexidine Gluconate Cloth  6 each Topical Q0600  . cinacalcet  60 mg Oral Daily  . [START ON 01/26/2020] feeding supplement  237 mL Oral BID BM  . furosemide  40 mg Intravenous Q12H  . hydrALAZINE  50 mg Oral Q8H  . isosorbide mononitrate  30 mg Oral Daily  . losartan  150 mg Oral Daily  . metoprolol succinate  100 mg Oral Daily  . multivitamin  1 tablet Oral q1800  . pantoprazole  40 mg Oral Daily  . sertraline  200 mg Oral Daily  . sevelamer carbonate  2,400 mg Oral TID WC  . traZODone  50 mg Oral QHS  . Warfarin - Pharmacist Dosing Inpatient   Does not  apply q1600  . zolpidem  5 mg Oral QHS   Continuous Infusions:    LOS: 2 days   Marylu Lund, MD [facility] Pager On Amion  If 7PM-7AM, please contact night-coverage 01/25/2020, 5:48 PM

## 2020-01-25 NOTE — Progress Notes (Signed)
[place] for Warfarin  Indication: Hx of  DVT    Labs: Recent Labs    01/22/20 2128 01/22/20 2325 01/23/20 0150 01/23/20 0437 01/24/20 0457 01/24/20 1541 01/25/20 0604  HGB 10.6*  --  10.7* 10.7*  --   --   --   HCT 32.4*  --  33.1* 32.3*  --   --   --   PLT 206  --  224 204  --   --   --   APTT  --   --   --  36  --   --   --   LABPROT  --   --   --  27.1*  --  21.2* 20.0*  INR  --   --   --  2.6*  --  1.9* 1.8*  CREATININE 8.13*  --  5.73* 5.73* 8.84*  --   --   TROPONINIHS 44* 57*  --   --   --   --  38*    Estimated Creatinine Clearance: 5.2 mL/min (A) (by C-G formula based on SCr of 8.84 mg/dL (H)).   Medical History: Past Medical History:  Diagnosis Date  . Anemia   . Anxiety   . Breast cancer ([place]) 01/2016   bilateral  . CHF (congestive heart failure) ([place])   . Chronic kidney disease   . Depression   . Dialysis patient ([place])   . DVT (deep venous thrombosis) ([facility])    left leg  . DVT (deep venous thrombosis) ([place]) 1985   right thigh  . Dysrhythmia   . Gout   . Headache   . HTN (hypertension)   . Hypertension   . Parathyroid abnormality ([place])   . Parathyroid disease ([place])   . Pneumonia 12/2015  . Psoriasis   . Renal insufficiency   . Sickle cell trait ([facility])    traits    Assessment: 65 yo female with PMH of CHF, DVT, ESRD on HD, HTN admitted with acute on chronic systolic CHF and hypertensive urgency. Pharmacy consulted for warfarin dosing and monitoring. INR therapeutic on admission at 2.6.   Home Regimen: Warfarin 5mg : M,W,F,Sa,Su                              Warfarin 7.5mg : T,Th  H/H: low stable at 10.7/32.3 PLTs: WNL 204-206k  DATE INR DOSE 1/16 2.6 5mg  1/17 1.9 7.5mg  1/18 1.8   Goal of Therapy:  INR 2-3   Plan:  --INR is slightly subtherapeutic. Will dose 7.5mg  x 1 tonight as per home regimen and assess with AM INR --CBC at least every 3 days per protocol.  --Pharmacy will continue to monitor  and order warfarin based on INR levels  [name] 01/25/2020 7:44 AM

## 2020-01-25 NOTE — ED Notes (Signed)
Pt being transported by NT Gaby to floor at this time.

## 2020-01-25 NOTE — Progress Notes (Addendum)
Patient returned from Dialysis. Alisa RN called report prior to calling transport for patient. Alisa reported 2L removed today during session.  Patient arrived back to unit. Ordered lunch and daily medications were administered. Patient resting comfortably in bed.

## 2020-01-25 NOTE — Consult Note (Signed)
   Heart Failure Nurse Navigator Note  HFeEF 30-35%.  Mild aortic insufficiency.  Moderate mitral regurgitation.  Mild left atrial enlargement.  She presented to the emergency room with complaints of worsening shortness of breath, dry cough and wheezing.  Comorbidities:  Anxiety/depression Chronic kidney disease on hemodialysis Gout Hypertension  Medications:  Furosemide 40 mg IV every 12 hours Hydralazine 50 mg every 8 hours Isosorbide mononitrate 30 mg daily Losartan 150 mg daily Metoprolol succinate 100 mg daily  Labs:  Sodium 140, potassium 4.5, chloride 102, CO2 25, BUN 52, creatinine 8.8, BNP on admission 392 troponin 38 Intake and output not documented Weight 52.3 kg BMI 19.19 Blood pressure 128/97   Assessment:  General-she is awake and alert, in no acute distress  Cardiac-heart tones of regular rate and rhythm.  Chest-breath sounds are clear to posterior auscultation  Abdomen- flat, non tender  Musculoskeletal-there is no lower extremity edema noted  Psych-she is pleasant and appropriate, talkative makes good eye contact.  Neurologic-speech is clear she moves all extremities     Initial visit with patient today after she returned from dialysis.  Discussed  low-sodium diet.  She admitted to some dietary indiscretions eating convenience frozen foods and ham here recently.  Enforced foods to stay away from, suggested keeping a diary so she can see what her daily intake is in then limiting it to no more than 2000 mg in a 24-hour period.  Also discussed fluid intake.  She states that she drinks Ensure with each meal, and then she drinks one 8 ounce bottles of water throughout the day, and then takes her medications with water.  She does not weigh herself daily as she states that her scale needs a battery.  Reinforced the importance of weighing daily and reporting to physician weight gains.  She had no further questions at this time.   Given living with  heart failure teaching booklet along with his own magnet.  We will continue to follow along while she is in the hospital.  Pricilla Riffle RN [facility]

## 2020-01-25 NOTE — ED Notes (Signed)
Spoke with Randol Kern, NP on phone and informed her of CP and EKG changes. Randol Kern, NP to bedside.

## 2020-01-25 NOTE — Progress Notes (Signed)
[employer] RN reports patient with chest pain. EKG with SR 90's Possible ischemic changes anterior leads V2, V3.  In setting of fluid overload from need for HD.  Only mild shortness of breath reported but not appreciated by exam. At rest she is talking in full sentences, systolic pressures I 469'F SR in 90's and oxygen saturations stable on room air Troponin and BNP pending with AM labs Likely EKG changes ischemia from volume status and need for HD. Patient scheduled for HD this am Continue to monitor closely on tele. Trend Trop

## 2020-01-25 NOTE — Progress Notes (Signed)
[facility]  ROUNDING NOTE   Subjective:   Patient underwent hemodialysis today as opposed to yesterday due to dialysis nursing shortage. Reports respiratory status improved.  Objective:  Vital signs in last 24 hours:  Temp:  [98.1 F (36.7 C)-98.3 F (36.8 C)] 98.1 F (36.7 C) (01/18 1245) Pulse Rate:  [67-85] 85 (01/18 1528) Resp:  [16-18] 16 (01/18 1528) BP: (93-150)/(65-103) 124/88 (01/18 1528) SpO2:  [91 %-100 %] 100 % (01/18 1528)  Weight change:  Filed Weights   01/22/20 2114  Weight: 52.3 kg    Intake/Output: I/O last 3 completed shifts: In: 120 [P.O.:120] Out: -    Intake/Output this shift:  No intake/output data recorded.  Physical Exam: General:  No acute distress  Head:  Normocephalic, atraumatic. Moist oral mucosal membranes  Eyes:  Anicteric  Neck:  Supple  Lungs:   Clear bilateral  Heart:  S1S2 no rubs  Abdomen:   Soft, nontender, bowel sounds present  Extremities:  No peripheral edema.  Neurologic:  Awake, alert, following commands  Skin:  No lesions  Access:  Left IJ PermCath    Basic Metabolic Panel: Recent Labs  Lab 01/22/20 2128 01/23/20 0150 01/23/20 0437 01/24/20 0457  NA 141  --  140 140  K 4.5  --  3.7 4.5  CL 102  --  103 102  CO2 22  --  25 25  GLUCOSE 122*  --  98 91  BUN 47*  --  27* 52*  CREATININE 8.13* 5.73* 5.73* 8.84*  CALCIUM 9.1  --  8.9 9.2  PHOS  --  2.9  --   --     Liver Function Tests: Recent Labs  Lab 01/22/20 2128 01/23/20 0437  AST 31 26  ALT 27 26  ALKPHOS 113 91  BILITOT 0.6 0.8  PROT 8.0 7.2  ALBUMIN 4.0 3.7   No results for input(s): LIPASE, AMYLASE in the last 168 hours. No results for input(s): AMMONIA in the last 168 hours.  CBC: Recent Labs  Lab 01/22/20 2128 01/23/20 0150 01/23/20 0437  WBC 11.7* 10.1 10.1  NEUTROABS 9.9*  --   --   HGB 10.6* 10.7* 10.7*  HCT 32.4* 33.1* 32.3*  MCV 89.3 87.3 86.8  PLT 206 224 204    Cardiac Enzymes: No results for input(s):  CKTOTAL, CKMB, CKMBINDEX, TROPONINI in the last 168 hours.  BNP: Invalid input(s): POCBNP  CBG: No results for input(s): GLUCAP in the last 168 hours.  Microbiology: Results for orders placed or performed during the hospital encounter of 01/22/20  Resp panel by RT-PCR (RSV, Flu A&B, Covid) Nasopharyngeal Swab     Status: None   Collection Time: 01/22/20  9:28 PM   Specimen: Nasopharyngeal Swab; Nasopharyngeal(NP) swabs in vial transport medium  Result Value Ref Range Status   SARS Coronavirus 2 by RT PCR NEGATIVE NEGATIVE Final    Comment: (NOTE) SARS-CoV-2 target nucleic acids are NOT DETECTED.  The SARS-CoV-2 RNA is generally detectable in upper respiratory specimens during the acute phase of infection. The lowest concentration of SARS-CoV-2 viral copies this assay can detect is 138 copies/mL. A negative result does not preclude SARS-Cov-2 infection and should not be used as the sole basis for treatment or other patient management decisions. A negative result may occur with  improper specimen collection/handling, submission of specimen other than nasopharyngeal swab, presence of viral mutation(s) within the areas targeted by this assay, and inadequate number of viral copies(<138 copies/mL). A negative result must be combined with  clinical observations, patient history, and epidemiological information. The expected result is Negative.  Fact Sheet for Patients:  EntrepreneurPulse.com.au  Fact Sheet for Healthcare Providers:  IncredibleEmployment.be  This test is no t yet approved or cleared by the [place] FDA and  has been authorized for detection and/or diagnosis of SARS-CoV-2 by FDA under an Emergency Use Authorization (EUA). This EUA will remain  in effect (meaning this test can be used) for the duration of the COVID-19 declaration under Section 564(b)(1) of the Act, 21 U.S.C.section 360bbb-3(b)(1), unless the authorization is  terminated  or revoked sooner.       Influenza A by PCR NEGATIVE NEGATIVE Final   Influenza B by PCR NEGATIVE NEGATIVE Final    Comment: (NOTE) The Xpert Xpress SARS-CoV-2/FLU/RSV plus assay is intended as an aid in the diagnosis of influenza from Nasopharyngeal swab specimens and should not be used as a sole basis for treatment. Nasal washings and aspirates are unacceptable for Xpert Xpress SARS-CoV-2/FLU/RSV testing.  Fact Sheet for Patients: EntrepreneurPulse.com.au  Fact Sheet for Healthcare Providers: IncredibleEmployment.be  This test is not yet approved or cleared by the [place] FDA and has been authorized for detection and/or diagnosis of SARS-CoV-2 by FDA under an Emergency Use Authorization (EUA). This EUA will remain in effect (meaning this test can be used) for the duration of the COVID-19 declaration under Section 564(b)(1) of the Act, 21 U.S.C. section 360bbb-3(b)(1), unless the authorization is terminated or revoked.     Resp Syncytial Virus by PCR NEGATIVE NEGATIVE Final    Comment: (NOTE) Fact Sheet for Patients: EntrepreneurPulse.com.au  Fact Sheet for Healthcare Providers: IncredibleEmployment.be  This test is not yet approved or cleared by the [place] FDA and has been authorized for detection and/or diagnosis of SARS-CoV-2 by FDA under an Emergency Use Authorization (EUA). This EUA will remain in effect (meaning this test can be used) for the duration of the COVID-19 declaration under Section 564(b)(1) of the Act, 21 U.S.C. section 360bbb-3(b)(1), unless the authorization is terminated or revoked.  Performed at [facility], [address] [place], [place] [address]     Coagulation Studies: Recent Labs    01/23/20 0437 01/24/20 1541 01/25/20 0604  LABPROT 27.1* 21.2* 20.0*  INR 2.6* 1.9* 1.8*    Urinalysis: No results for input(s): COLORURINE,  LABSPEC, PHURINE, GLUCOSEU, HGBUR, BILIRUBINUR, KETONESUR, PROTEINUR, UROBILINOGEN, NITRITE, LEUKOCYTESUR in the last 72 hours.  Invalid input(s): APPERANCEUR    Imaging: No results found.   Medications:    . Chlorhexidine Gluconate Cloth  6 each Topical Q0600  . cinacalcet  60 mg Oral Daily  . [START ON 01/26/2020] feeding supplement  237 mL Oral BID BM  . furosemide  40 mg Intravenous Q12H  . hydrALAZINE  50 mg Oral Q8H  . isosorbide mononitrate  30 mg Oral Daily  . losartan  150 mg Oral Daily  . metoprolol succinate  100 mg Oral Daily  . multivitamin  1 tablet Oral q1800  . pantoprazole  40 mg Oral Daily  . sertraline  200 mg Oral Daily  . sevelamer carbonate  2,400 mg Oral TID WC  . traZODone  50 mg Oral QHS  . Warfarin - Pharmacist Dosing Inpatient   Does not apply q1600  . zolpidem  5 mg Oral QHS   acetaminophen **OR** acetaminophen, HYDROcodone-acetaminophen, labetalol, loperamide, ondansetron **OR** ondansetron (ZOFRAN) IV, polyethylene glycol, traZODone  Assessment/ Plan:  65 y.o. female with past medical history of ESRD on HD MWF, hypertension, chronic systolic heart  failure ejection fraction 40 to 45%, anemia of chronic kidney disease, secondary hyperparathyroidism, gout, anxiety, depression who presented with acute respiratory failure.  [facility] [place]/MWF/EDW 62.5.   1.  ESRD on HD MWF.  Patient underwent dialysis on 01/21/2020 as an outpatient and completed her treatment.  Came back to the emergency department on 01/22/2020 with increasing shortness of breath.  -Patient underwent hemodialysis today and we will plan for dialysis treatment again tomorrow.  2.  Acute respiratory failure.  Initially required BiPAP when she first presented.   -Respiratory status significantly improved as compared to admission. We will plan for additional ultrafiltration with dialysis tomorrow.  3.  Anemia of chronic kidney disease.  Hold off Epogen for now.  4.  Secondary  hyperparathyroidism.  Maintain the patient on Cinacalcet 60 mg daily as well as Renvela 24 mg p.o. 3 times daily with meals.     LOS: 2 Annaly Skop 1/18/20227:01 PM

## 2020-01-25 NOTE — Progress Notes (Signed)
Initial Nutrition Assessment  DOCUMENTATION CODES:   Not applicable  INTERVENTION:   Ensure Enlive po BID, each supplement provides 350 kcal and 20 grams of protein  Rena-vit po daily   Liberalize diet   NUTRITION DIAGNOSIS:   Increased nutrient needs related to chronic illness (ESRD on HD, COPD, CHF) as evidenced by estimated needs.  GOAL:   Patient will meet greater than or equal to 90% of their needs  MONITOR:   PO intake,Supplement acceptance,Labs,Weight trends,Skin,I & O's  REASON FOR ASSESSMENT:   Malnutrition Screening Tool    ASSESSMENT:   65 y.o. female with past medical history of ESRD on HD MWF, hypertension, chronic systolic heart failure ejection fraction 40 to 45%, anemia of chronic kidney disease, secondary hyperparathyroidism, gout, anxiety, COPD, breast cancer s/p double mastectomy, secondary cardiomyopathy, DVT and depression who presented with acute respiratory failure.  RD working remotely.  Spoke with pt via phone. Pt reports poor appetite and oral intake at baseline and in hospital. Pt reports that she is eating some of every meal and that she tries to at least eat the protein from her meal trays. Pt reports that she drinks vanilla Ensure at home and has been drinking this in hospital as well. Per chart, pt is down 25lbs(18%) in < 1 year; this is significant. Pt does endorse weight loss. RD discussed with pt the importance of adequate nutrition needed to preserve lean muscle. RD also discussed with pt about the low sodium diet. Recommend continue supplements and rena-vit daily. RD will liberalize pt's diet as a heart healthy diet is restrictive of protein. Pt is at high risk for malnutrition.   Medications reviewed and include: cinacalcet, lasix, rena-vit, protonix, renvela, warfarin  Labs reviewed: K 4.5 wnl, BUN 52(H), creat 8.84(H) Hgb 10.7(L), Hct 32.3(L)  NUTRITION - FOCUSED PHYSICAL EXAM: Unable to perform at this time   Diet Order:   Diet  Order            Diet 2 gram sodium Room service appropriate? Yes; Fluid consistency: Thin  Diet effective now                EDUCATION NEEDS:   Education needs have been addressed  Skin:  Skin Assessment: Reviewed RN Assessment  Last BM:  pta  Height:   Ht Readings from Last 1 Encounters:  01/22/20 5\' 5"  (1.651 m)    Weight:   Wt Readings from Last 1 Encounters:  01/22/20 52.3 kg    Ideal Body Weight:  56.8 kg  BMI:  Body mass index is 19.19 kg/m.  Estimated Nutritional Needs:   Kcal:  1500-1700kcal/day  Protein:  75-85g/day  Fluid:  UOP +1L  Koleen Distance MS, RD, LDN Please refer to [facility] for RD and/or RD on-call/weekend/after hours pager

## 2020-01-25 NOTE — ED Notes (Signed)
When rounding on pt pt began to complain of CP. EKG performed and shown to Nancee Liter, MD in ED. Per MD not a STEMI but EKG looks ischemic and is concerning. Morrision NP sent message via secure chat and paged.

## 2020-01-26 LAB — CBC
HCT: 30 % — ABNORMAL LOW (ref 36.0–46.0)
Hemoglobin: 9.9 g/dL — ABNORMAL LOW (ref 12.0–15.0)
MCH: 28.5 pg (ref 26.0–34.0)
MCHC: 33 g/dL (ref 30.0–36.0)
MCV: 86.5 fL (ref 80.0–100.0)
Platelets: 207 10*3/uL (ref 150–400)
RBC: 3.47 MIL/uL — ABNORMAL LOW (ref 3.87–5.11)
RDW: 15.6 % — ABNORMAL HIGH (ref 11.5–15.5)
WBC: 6.9 10*3/uL (ref 4.0–10.5)
nRBC: 0 % (ref 0.0–0.2)

## 2020-01-26 LAB — PROTIME-INR
INR: 1.5 — ABNORMAL HIGH (ref 0.8–1.2)
Prothrombin Time: 17.9 seconds — ABNORMAL HIGH (ref 11.4–15.2)

## 2020-01-26 LAB — PHOSPHORUS: Phosphorus: 5.6 mg/dL — ABNORMAL HIGH (ref 2.5–4.6)

## 2020-01-26 MED ORDER — ZOLPIDEM TARTRATE 5 MG PO TABS
10.0000 mg | ORAL_TABLET | Freq: Once | ORAL | Status: AC | PRN
  Administered 2020-01-26: 10 mg via ORAL
  Filled 2020-01-26: qty 2

## 2020-01-26 MED ORDER — TRAZODONE HCL 50 MG PO TABS: 50.0000 mg | ORAL_TABLET | Freq: Every day | ORAL | 3 refills | Status: DC

## 2020-01-26 MED ORDER — WARFARIN SODIUM 5 MG PO TABS: 5.0000 mg | ORAL_TABLET | ORAL | 3 refills | Status: DC

## 2020-01-26 MED ORDER — ZOLPIDEM TARTRATE 10 MG PO TABS: 10.0000 mg | ORAL_TABLET | Freq: Every day | ORAL | 0 refills | Status: DC

## 2020-01-26 MED ORDER — SERTRALINE HCL 100 MG PO TABS: 150.0000 mg | ORAL_TABLET | Freq: Every day | ORAL | 3 refills | Status: DC

## 2020-01-26 MED ORDER — TRAZODONE HCL 50 MG PO TABS
25.0000 mg | ORAL_TABLET | Freq: Once | ORAL | Status: AC | PRN
  Administered 2020-01-26: 25 mg via ORAL
  Filled 2020-01-26: qty 1

## 2020-01-26 MED ORDER — PANTOPRAZOLE SODIUM 40 MG PO TBEC: 40.0000 mg | DELAYED_RELEASE_TABLET | Freq: Every day | ORAL | 3 refills | Status: DC

## 2020-01-26 MED ORDER — ISOSORBIDE MONONITRATE ER 30 MG PO TB24: 30.0000 mg | ORAL_TABLET | Freq: Every day | ORAL | 3 refills | Status: DC

## 2020-01-26 MED ORDER — METOPROLOL SUCCINATE ER 50 MG PO TB24: 50.0000 mg | ORAL_TABLET | Freq: Every day | ORAL | 3 refills | Status: DC

## 2020-01-26 MED ORDER — WARFARIN SODIUM 10 MG PO TABS
10.0000 mg | ORAL_TABLET | Freq: Once | ORAL | Status: AC
  Administered 2020-01-26: 10 mg via ORAL
  Filled 2020-01-26: qty 1

## 2020-01-26 NOTE — Progress Notes (Signed)
[facility]  ROUNDING NOTE   Subjective:   Patient undergo hemodialysis today as per usual schedule. No worsening shortness of breath at the moment.  Objective:  Vital signs in last 24 hours:  Temp:  [98.1 F (36.7 C)-99.6 F (37.6 C)] 98.1 F (36.7 C) (01/19 1200) Pulse Rate:  [77-85] 78 (01/19 0828) Resp:  [16-22] 21 (01/19 1200) BP: (103-124)/(76-88) 116/83 (01/19 1200) SpO2:  [94 %-100 %] 96 % (01/19 0828) Weight:  [59.5 kg] 59.5 kg (01/19 0151)  Weight change:  Filed Weights   01/22/20 2114 01/26/20 0151  Weight: 52.3 kg 59.5 kg    Intake/Output: I/O last 3 completed shifts: In: 120 [P.O.:120] Out: 0    Intake/Output this shift:  Total I/O In: 240 [P.O.:240] Out: -   Physical Exam: General:  No acute distress  Head:  Normocephalic, atraumatic. Moist oral mucosal membranes  Eyes:  Anicteric  Neck:  Supple  Lungs:   Clear bilateral  Heart:  S1S2 no rubs  Abdomen:   Soft, nontender, bowel sounds present  Extremities:  No peripheral edema.  Neurologic:  Awake, alert, following commands  Skin:  No lesions  Access:  Left IJ PermCath    Basic Metabolic Panel: Recent Labs  Lab 01/22/20 2128 01/23/20 0150 01/23/20 0437 01/24/20 0457  NA 141  --  140 140  K 4.5  --  3.7 4.5  CL 102  --  103 102  CO2 22  --  25 25  GLUCOSE 122*  --  98 91  BUN 47*  --  27* 52*  CREATININE 8.13* 5.73* 5.73* 8.84*  CALCIUM 9.1  --  8.9 9.2  PHOS  --  2.9  --   --     Liver Function Tests: Recent Labs  Lab 01/22/20 2128 01/23/20 0437  AST 31 26  ALT 27 26  ALKPHOS 113 91  BILITOT 0.6 0.8  PROT 8.0 7.2  ALBUMIN 4.0 3.7   No results for input(s): LIPASE, AMYLASE in the last 168 hours. No results for input(s): AMMONIA in the last 168 hours.  CBC: Recent Labs  Lab 01/22/20 2128 01/23/20 0150 01/23/20 0437 01/26/20 0539  WBC 11.7* 10.1 10.1 6.9  NEUTROABS 9.9*  --   --   --   HGB 10.6* 10.7* 10.7* 9.9*  HCT 32.4* 33.1* 32.3* 30.0*  MCV 89.3  87.3 86.8 86.5  PLT 206 224 204 207    Cardiac Enzymes: No results for input(s): CKTOTAL, CKMB, CKMBINDEX, TROPONINI in the last 168 hours.  BNP: Invalid input(s): POCBNP  CBG: No results for input(s): GLUCAP in the last 168 hours.  Microbiology: Results for orders placed or performed during the hospital encounter of 01/22/20  Resp panel by RT-PCR (RSV, Flu A&B, Covid) Nasopharyngeal Swab     Status: None   Collection Time: 01/22/20  9:28 PM   Specimen: Nasopharyngeal Swab; Nasopharyngeal(NP) swabs in vial transport medium  Result Value Ref Range Status   SARS Coronavirus 2 by RT PCR NEGATIVE NEGATIVE Final    Comment: (NOTE) SARS-CoV-2 target nucleic acids are NOT DETECTED.  The SARS-CoV-2 RNA is generally detectable in upper respiratory specimens during the acute phase of infection. The lowest concentration of SARS-CoV-2 viral copies this assay can detect is 138 copies/mL. A negative result does not preclude SARS-Cov-2 infection and should not be used as the sole basis for treatment or other patient management decisions. A negative result may occur with  improper specimen collection/handling, submission of specimen other than nasopharyngeal swab,  presence of viral mutation(s) within the areas targeted by this assay, and inadequate number of viral copies(<138 copies/mL). A negative result must be combined with clinical observations, patient history, and epidemiological information. The expected result is Negative.  Fact Sheet for Patients:  EntrepreneurPulse.com.au  Fact Sheet for Healthcare Providers:  IncredibleEmployment.be  This test is no t yet approved or cleared by the [place] FDA and  has been authorized for detection and/or diagnosis of SARS-CoV-2 by FDA under an Emergency Use Authorization (EUA). This EUA will remain  in effect (meaning this test can be used) for the duration of the COVID-19 declaration under Section  564(b)(1) of the Act, 21 U.S.C.section 360bbb-3(b)(1), unless the authorization is terminated  or revoked sooner.       Influenza A by PCR NEGATIVE NEGATIVE Final   Influenza B by PCR NEGATIVE NEGATIVE Final    Comment: (NOTE) The Xpert Xpress SARS-CoV-2/FLU/RSV plus assay is intended as an aid in the diagnosis of influenza from Nasopharyngeal swab specimens and should not be used as a sole basis for treatment. Nasal washings and aspirates are unacceptable for Xpert Xpress SARS-CoV-2/FLU/RSV testing.  Fact Sheet for Patients: EntrepreneurPulse.com.au  Fact Sheet for Healthcare Providers: IncredibleEmployment.be  This test is not yet approved or cleared by the [place] FDA and has been authorized for detection and/or diagnosis of SARS-CoV-2 by FDA under an Emergency Use Authorization (EUA). This EUA will remain in effect (meaning this test can be used) for the duration of the COVID-19 declaration under Section 564(b)(1) of the Act, 21 U.S.C. section 360bbb-3(b)(1), unless the authorization is terminated or revoked.     Resp Syncytial Virus by PCR NEGATIVE NEGATIVE Final    Comment: (NOTE) Fact Sheet for Patients: EntrepreneurPulse.com.au  Fact Sheet for Healthcare Providers: IncredibleEmployment.be  This test is not yet approved or cleared by the [place] FDA and has been authorized for detection and/or diagnosis of SARS-CoV-2 by FDA under an Emergency Use Authorization (EUA). This EUA will remain in effect (meaning this test can be used) for the duration of the COVID-19 declaration under Section 564(b)(1) of the Act, 21 U.S.C. section 360bbb-3(b)(1), unless the authorization is terminated or revoked.  Performed at [facility], [address] [place], [place] [address]     Coagulation Studies: Recent Labs    01/24/20 1541 01/25/20 0604 01/26/20 0539  LABPROT 21.2* 20.0*  17.9*  INR 1.9* 1.8* 1.5*    Urinalysis: No results for input(s): COLORURINE, LABSPEC, PHURINE, GLUCOSEU, HGBUR, BILIRUBINUR, KETONESUR, PROTEINUR, UROBILINOGEN, NITRITE, LEUKOCYTESUR in the last 72 hours.  Invalid input(s): APPERANCEUR    Imaging: No results found.   Medications:    . Chlorhexidine Gluconate Cloth  6 each Topical Q0600  . cinacalcet  60 mg Oral Daily  . feeding supplement  237 mL Oral BID BM  . furosemide  40 mg Intravenous Q12H  . hydrALAZINE  50 mg Oral Q8H  . isosorbide mononitrate  30 mg Oral Daily  . losartan  150 mg Oral Daily  . metoprolol succinate  100 mg Oral Daily  . multivitamin  1 tablet Oral q1800  . pantoprazole  40 mg Oral Daily  . sertraline  200 mg Oral Daily  . sevelamer carbonate  2,400 mg Oral TID WC  . traZODone  50 mg Oral QHS  . warfarin  10 mg Oral ONCE-1600  . Warfarin - Pharmacist Dosing Inpatient   Does not apply q1600  . zolpidem  5 mg Oral QHS   acetaminophen **OR** acetaminophen, HYDROcodone-acetaminophen,  labetalol, loperamide, ondansetron **OR** ondansetron (ZOFRAN) IV, polyethylene glycol, traZODone  Assessment/ Plan:  65 y.o. female with past medical history of ESRD on HD MWF, hypertension, chronic systolic heart failure ejection fraction 40 to 45%, anemia of chronic kidney disease, secondary hyperparathyroidism, gout, anxiety, depression who presented with acute respiratory failure.  [facility]/MWF/EDW 62.5.   1.  ESRD on HD MWF.  Patient underwent dialysis on 01/21/2020 as an outpatient and completed her treatment.  Came back to the emergency department on 01/22/2020 with increasing shortness of breath.  -Patient due for hemodialysis treatment today.  This is as per her usual schedule.  2.  Acute respiratory failure.  Initially required BiPAP when she first presented.   -Significantly improved as compared to admission.  3.  Anemia of chronic kidney disease.  Resume Epogen as an outpatient.  4.  Secondary  hyperparathyroidism.  Continue Renvela 2400 mg p.o. 3 times daily with meals as well as Sensipar 60 mg daily.   LOS: 3 Jazline Cumbee 1/19/202212:50 PM

## 2020-01-26 NOTE — Progress Notes (Signed)
Mobility Specialist - Progress Note   01/26/20 1300  Mobility  Activity Contraindicated/medical hold  Mobility performed by Mobility specialist    Per chart review, pt undergoing dialysis at this time. Will attempt session another date/time as appropriate.    Kathee Delton Mobility Specialist 01/26/20, 1:10 PM

## 2020-01-26 NOTE — Progress Notes (Signed)
Discussed discharge instruction with patient and daughter including medications and follow up appointments.

## 2020-01-26 NOTE — Consult Note (Signed)
CARDIOLOGY CONSULT NOTE               Patient ID: Teresa Cook MRN: [id number] DOB/AGE: October 02, 1955 65 y.o.  Admit date: 01/22/2020 Referring Physician Dr. Myrene Buddy hospitalist Primary Physician Dr. Candiss Norse nephrology Primary Cardiologist Dr. Nehemiah Massed cardiology Reason for Consultation acute respiratory failure congestive heart failure cardiomyopathy  HPI: Patient is a 65 year old female history of end-stage renal disease hypertension hyperlipidemia previous breast cancer shortness of breath acutely acute on chronic about 6 months ago had normal LV function congestive heart failure depressed left ventricular function mildly to around 40 to 45% previous echo about 6 months right normal LV function.  The patient denies any chest pain she was Had not missed any dialysis treatments and then acutely became dyspneic short of breath.  BNP was elevated all suggestive of volume overload so she had aggressive dialysis management over the last 24 hours which seems to have improved her symptoms.  Cardiac enzymes were negative.  She denies any leg swelling she has had some chest pressure but mostly when she was dyspneic.  Denies any fever chills or sweats usually has treatment for atrial fibrillation followed by Dr. Nehemiah Massed.  Patient feels reasonably well during interview  Review of systems complete and found to be negative unless listed above     Past Medical History:  Diagnosis Date  . Anemia   . Anxiety   . Breast cancer ([place]) 01/2016   bilateral  . CHF (congestive heart failure) ([place])   . Chronic kidney disease   . Depression   . Dialysis patient ([place])   . DVT (deep venous thrombosis) ([facility])    left leg  . DVT (deep venous thrombosis) ([place]) 1985   right thigh  . Dysrhythmia   . Gout   . Headache   . HTN (hypertension)   . Hypertension   . Parathyroid abnormality ([place])   . Parathyroid disease ([place])   . Pneumonia 12/2015  . Psoriasis   . Renal insufficiency   . Sickle  cell trait ([place])    traits    Past Surgical History:  Procedure Laterality Date  . ABDOMINAL HYSTERECTOMY  1980  . APPENDECTOMY    . BREAST BIOPSY Left 10/28/2013   benign  . BREAST EXCISIONAL BIOPSY Left 2002   benign  . INSERTION OF DIALYSIS CATHETER  2014  . LIPOMA EXCISION N/A 01/23/2016   Procedure: EXCISION LIPOMA;  Surgeon: Hubbard Robinson, MD;  Location: [facility];  Service: General;  Laterality: N/A;  . MASTECTOMY W/ SENTINEL NODE BIOPSY Bilateral 01/23/2016   Procedure: bilateral MASTECTOMY WITH  bilateral SENTINEL LYMPH NODE BIOPSY possible left axillary node dissection forehead lipoma removal;  Surgeon: Hubbard Robinson, MD;  Location: [facility];  Service: General;  Laterality: Bilateral;  . PARTIAL HYSTERECTOMY    . PERIPHERAL VASCULAR CATHETERIZATION N/A 12/25/2015   Procedure: Dialysis/Perma Catheter Insertion;  Surgeon: Algernon Huxley, MD;  Location: [facility];  Service: Cardiovascular;  Laterality: N/A;  . PERIPHERAL VASCULAR CATHETERIZATION Left 01/22/2016   Procedure: Dialysis/Perma Catheter Insertion;  Surgeon: Algernon Huxley, MD;  Location: [facility];  Service: Cardiovascular;  Laterality: Left;  . PERIPHERAL VASCULAR CATHETERIZATION N/A 01/26/2016   Procedure: Dialysis/Perma Catheter Insertion;  Surgeon: Katha Cabal, MD;  Location: [facility];  Service: Cardiovascular;  Laterality: N/A;  . PORT-A-CATH REMOVAL N/A 12/20/2015   Procedure: REMOVAL PORT-A-CATH;  Surgeon: Hubbard Robinson, MD;  Location: [facility];  Service: General;  Laterality: N/A;  left    . PORTACATH PLACEMENT Left 08/21/2015   Procedure: INSERTION PORT-A-CATH;  Surgeon: Hubbard Robinson, MD;  Location: [facility];  Service: General;  Laterality: Left;  . REMOVAL OF A DIALYSIS CATHETER  2017    Medications Prior to Admission  Medication Sig Dispense Refill Last Dose  . aspirin-acetaminophen-caffeine (EXCEDRIN MIGRAINE) 250-250-65 MG tablet Take 1-2 tablets by mouth  every 6 (six) hours as needed for headache.   Unknown at PRN  . b complex-vitamin c-folic acid (NEPHRO-VITE) 0.8 MG TABS tablet Take 1 tablet by mouth daily.     . cinacalcet (SENSIPAR) 60 MG tablet Take 60 mg by mouth daily before lunch.   24+ hours at Unknown  . isosorbide mononitrate (IMDUR) 30 MG 24 hr tablet Take 1 tablet (30 mg total) by mouth daily. 30 tablet 0 24+ hours at Unknown  . metoprolol succinate (TOPROL-XL) 50 MG 24 hr tablet Take 50 mg by mouth daily. Take with or immediately following a meal.   24+ hours at Unknown  . pantoprazole (PROTONIX) 40 MG tablet Take 1 tablet (40 mg total) by mouth daily. 30 tablet 0 24+ hours at Unknown  . sertraline (ZOLOFT) 100 MG tablet Take 150 mg by mouth daily.   24+ hours at Unknown  . sevelamer carbonate (RENVELA) 800 MG tablet Take 800-2,400 mg by mouth See admin instructions. Take 3 tablets (2400mg ) by mouth three times daily before meals and take 1 tablet (800mg ) by mouth daily before a snack   24+ hours at Unknown  . traZODone (DESYREL) 50 MG tablet Take 50 mg by mouth at bedtime. May increase to 2 tablets if needed.   24+ hours at Unknown  . warfarin (COUMADIN) 5 MG tablet Take 5-7.5 mg by mouth See admin instructions. Take 1 tablet (5mg ) by mouth every Monday, Wednesday, Friday, Saturday and Sunday evening and take 1 tablets (7.5mg ) by mouth every Tuesday and Thursday evening   24+ hours at Unknown  . zolpidem (AMBIEN) 10 MG tablet Take 10 mg by mouth at bedtime.   24+ hours at Unknown   Social History   Socioeconomic History  . Marital status: Widowed    Spouse name: Not on file  . Number of children: Not on file  . Years of education: Not on file  . Highest education level: Not on file  Occupational History  . Occupation: disabled  Tobacco Use  . Smoking status: Never Smoker  . Smokeless tobacco: Never Used  Vaping Use  . Vaping Use: Never used  Substance and Sexual Activity  . Alcohol use: No    Alcohol/week: 0.0 standard  drinks  . Drug use: No  . Sexual activity: Never    Birth control/protection: Surgical  Other Topics Concern  . Not on file  Social History Narrative   ** Merged History Encounter **       Social Determinants of Health   Financial Resource Strain: Not on file  Food Insecurity: Not on file  Transportation Needs: Not on file  Physical Activity: Not on file  Stress: Not on file  Social Connections: Not on file  Intimate Partner Violence: Not on file    Family History  Problem Relation Age of Onset  . Stroke Mother   . CVA Mother   . Hypertension Mother   . Hypertension Father   . Hypertension Sister   . Diabetes Sister   . Cancer Sister 7       Breast  . Stroke Brother   .  Hypertension Brother   . Breast cancer Maternal Aunt 70      Review of systems complete and found to be negative unless listed above      PHYSICAL EXAM  General: Well developed, well nourished, in no acute distress HEENT:  Normocephalic and atramatic Neck:  No JVD.  Lungs: Diminished bilaterally to auscultation and percussion. Heart: HRRR . Normal S1 and S2 S3/S4 gallops or murmurs.  Abdomen: Bowel sounds are positive, abdomen soft and non-tender  Msk:  Back normal, normal gait. Normal strength and tone for age. Extremities: No clubbing, cyanosis or edema.   Neuro: Alert and oriented X 3. Psych:  Good affect, responds appropriately  Labs:   Lab Results  Component Value Date   WBC 6.9 01/26/2020   HGB 9.9 (L) 01/26/2020   HCT 30.0 (L) 01/26/2020   MCV 86.5 01/26/2020   PLT 207 01/26/2020    Recent Labs  Lab 01/23/20 0437 01/24/20 0457  NA 140 140  K 3.7 4.5  CL 103 102  CO2 25 25  BUN 27* 52*  CREATININE 5.73* 8.84*  CALCIUM 8.9 9.2  PROT 7.2  --   BILITOT 0.8  --   ALKPHOS 91  --   ALT 26  --   AST 26  --   GLUCOSE 98 91   Lab Results  Component Value Date   CKTOTAL 43 02/03/2014   CKMB 0.9 02/03/2014   TROPONINI 0.06 (HH) 01/13/2017    Lab Results  Component  Value Date   CHOL 250 (H) 02/03/2014   CHOL 335 (H) 04/30/2013   Lab Results  Component Value Date   HDL 35 (L) 02/03/2014   HDL 32 (L) 04/30/2013   Lab Results  Component Value Date   LDLCALC 186 (H) 02/03/2014   LDLCALC 258 (H) 04/30/2013   Lab Results  Component Value Date   TRIG 144 02/03/2014   TRIG 225 (H) 04/30/2013   No results found for: CHOLHDL No results found for: LDLDIRECT    Radiology: DG Chest Portable 1 View  Result Date: 01/22/2020 CLINICAL DATA:  Respiratory distress. EXAM: PORTABLE CHEST 1 VIEW COMPARISON:  January 12, 2017 FINDINGS: There is stable left-sided venous catheter positioning. Mild to moderate severity diffusely increased lung markings are seen. There is no evidence of a pleural effusion or pneumothorax. The heart size and mediastinal contours are within normal limits. There is marked severity calcification of the thoracic aorta. Moderate severity levoscoliosis of the lower thoracic spine is seen. IMPRESSION: Mild to moderate severity interstitial edema. Electronically Signed   By: Virgina Norfolk M.D.   On: 01/22/2020 21:59   ECHOCARDIOGRAM COMPLETE  Result Date: 01/23/2020    ECHOCARDIOGRAM REPORT   Patient Name:   Teresa Cook Date of Exam: 01/23/2020 Medical Rec #:  [id number]       Height:       65.0 in Accession #:    [id number]      Weight:       115.3 lb Date of Birth:  1955-08-03       BSA:          1.565 m Patient Age:    59 years        BP:           128/96 mmHg Patient Gender: F               HR:           85 bpm. Exam Location:  [facility] Procedure: 2D Echo,  Color Doppler and Cardiac Doppler Indications:     B34.19 CHF-Acute Systolic  History:         Patient has prior history of Echocardiogram examinations. CHF,                  CKD; Risk Factors:Hypertension.  Sonographer:     Charmayne Sheer RDCS (AE) Referring Phys:  [id number] Arvella Merles [place] Diagnosing Phys: Bartholome Bill MD  Sonographer Comments: Suboptimal subcostal window. Image acquisition challenging  due to respiratory motion. IMPRESSIONS  1. Left ventricular ejection fraction, by estimation, is 40 to 45%. Left ventricular ejection fraction by PLAX is 43 %. The left ventricle has mildly decreased function. The left ventricle demonstrates regional wall motion abnormalities (see scoring diagram/findings for description). There is moderate left ventricular hypertrophy. Left ventricular diastolic parameters were normal.  2. Right ventricular systolic function is normal. The right ventricular size is mildly enlarged.  3. Left atrial size was mildly dilated.  4. The mitral valve is grossly normal. Mild mitral valve regurgitation.  5. The aortic valve is calcified. Aortic valve regurgitation is trivial. FINDINGS  Left Ventricle: Left ventricular ejection fraction, by estimation, is 40 to 45%. Left ventricular ejection fraction by PLAX is 43 %. The left ventricle has mildly decreased function. The left ventricle demonstrates regional wall motion abnormalities. The left ventricular internal cavity size was normal in size. There is moderate left ventricular hypertrophy. Left ventricular diastolic parameters were normal.  LV Wall Scoring: The mid anteroseptal segment and mid inferolateral segment are hypokinetic. Right Ventricle: The right ventricular size is mildly enlarged. No increase in right ventricular wall thickness. Right ventricular systolic function is normal. Left Atrium: Left atrial size was mildly dilated. Right Atrium: Right atrial size was normal in size. Pericardium: There is no evidence of pericardial effusion. Mitral Valve: The mitral valve is grossly normal. Mild mitral valve regurgitation. MV peak gradient, 3.6 mmHg. The mean mitral valve gradient is 2.0 mmHg. Tricuspid Valve: The tricuspid valve is not well visualized. Tricuspid valve regurgitation is trivial. Aortic Valve: The aortic valve is calcified. Aortic valve regurgitation is trivial. Aortic valve mean gradient measures 3.0 mmHg. Aortic valve  peak gradient measures 4.8 mmHg. Aortic valve area, by VTI measures 2.43 cm. Pulmonic Valve: The pulmonic valve was not well visualized. Pulmonic valve regurgitation is trivial. Aorta: The aortic root was not well visualized. IAS/Shunts: The interatrial septum was not well visualized.  LEFT VENTRICLE PLAX 2D LV EF:         Left            Diastology                ventricular     LV e' medial:    3.92 cm/s                ejection        LV E/e' medial:  22.1                fraction by     LV e' lateral:   6.20 cm/s                PLAX is 43      LV E/e' lateral: 14.0                %. LVIDd:         3.40 cm LVIDs:         2.70 cm LV PW:  0.90 cm LV IVS:        0.80 cm LVOT diam:     2.40 cm LV SV:         49 LV SV Index:   31 LVOT Area:     4.52 cm  RIGHT VENTRICLE RV Basal diam:  2.90 cm LEFT ATRIUM             Index       RIGHT ATRIUM           Index LA diam:        3.00 cm 1.92 cm/m  RA Area:     10.80 cm LA Vol (A2C):   66.4 ml 42.42 ml/m RA Volume:   23.60 ml  15.08 ml/m LA Vol (A4C):   44.9 ml 28.68 ml/m LA Biplane Vol: 56.2 ml 35.90 ml/m  AORTIC VALVE                   PULMONIC VALVE AV Area (Vmax):    2.71 cm    PV Vmax:       0.80 m/s AV Area (Vmean):   2.33 cm    PV Vmean:      55.500 cm/s AV Area (VTI):     2.43 cm    PV VTI:        0.150 m AV Vmax:           110.00 cm/s PV Peak grad:  2.6 mmHg AV Vmean:          87.300 cm/s PV Mean grad:  1.0 mmHg AV VTI:            0.201 m AV Peak Grad:      4.8 mmHg AV Mean Grad:      3.0 mmHg LVOT Vmax:         65.90 cm/s LVOT Vmean:        44.900 cm/s LVOT VTI:          0.108 m LVOT/AV VTI ratio: 0.54  AORTA Ao Root diam: 3.60 cm MITRAL VALVE MV Area (PHT): 5.50 cm    SHUNTS MV Area VTI:   2.13 cm    Systemic VTI:  0.11 m MV Peak grad:  3.6 mmHg    Systemic Diam: 2.40 cm MV Mean grad:  2.0 mmHg MV Vmax:       0.95 m/s MV Vmean:      73.1 cm/s MV Decel Time: 138 msec MV E velocity: 86.50 cm/s MV A velocity: 81.80 cm/s MV E/A ratio:  1.06 Bartholome Bill MD Electronically signed by Bartholome Bill MD Signature Date/Time: 01/23/2020/11:05:01 AM    Final     EKG: Normal sinus rhythm LVH diffuse ST segment depression chronic doubtful for ischemia  ASSESSMENT AND PLAN:  Acute on chronic congestive heart failure Mild cardiomyopathy ejection fraction 40% Hypertension End-stage renal disease on dialysis Hyperlipidemia Shortness of breath Atrial fibrillation History of DVT History of breast cancer Elevated BNP. Marland Kitchen Plan Agree with aggressive dialysis therapy to reduce volume Agree with echocardiogram which showed mildly depressed left ventricular function which she has had in the past, I do not recommend any direct therapy at this point will reassess as an outpatient Continue hydralazine imdur beta-blocker losartan consider Entresto Continue anticoagulation atrial fibrillation rate controlled beta-blockers Agree with statin therapy for hyperlipidemia With fluctuating heart function may be dynamic when she is treated for heart failure we will reassess LV function in a few months.  We will also readjust medications consider  adding Entresto Should be safe to treat as an outpatient after dialysis Have the patient follow-up with cardiology 1 to 2 weeks   Signed: Yolonda Kida MD 01/26/2020, 1:26 PM

## 2020-01-26 NOTE — Progress Notes (Signed)
Patient reported to nurse yesterday that she was missing her bag of medications (per night nurse).   Patient stated she last saw her medications in a black and silver back placed on the stretcher when the EMS loaded her up to take her to the hospital.    Since then she has not seen the medications or the bag.    Followed up with Assistant Unit director [place] with issue.     MD is aware.     Chief Financial Officer ED, Pharmacy, Security and EMS to try to track down.   MD is going to rewrite all the scripts patient listed that were in the bag.

## 2020-01-26 NOTE — Progress Notes (Addendum)
Hemodialysis patient known at [facility] MWF 7:00am, patient normally drives self to treatments and has no concerns. Please contact me with any dialysis placement concerns.  Elvera Bicker Dialysis Coordinator [phone]

## 2020-01-26 NOTE — Care Management Important Message (Signed)
Important Message  Patient Details  Name: Teresa Cook MRN: [id number] Date of Birth: [birth year]   Medicare Important Message Given:  Yes     [name] 01/26/2020, 11:00 AM

## 2020-01-26 NOTE — Progress Notes (Signed)
Teresa Cook for Warfarin  Indication: Hx of  DVT    Labs: Recent Labs    01/24/20 0457 01/24/20 1541 01/25/20 0604 01/25/20 1300 01/26/20 0539  HGB  --   --   --   --  9.9*  HCT  --   --   --   --  30.0*  PLT  --   --   --   --  207  LABPROT  --  21.2* 20.0*  --  17.9*  INR  --  1.9* 1.8*  --  1.5*  CREATININE 8.84*  --   --   --   --   TROPONINIHS  --   --  38* 35*  --     Estimated Creatinine Clearance: 5.6 mL/min (A) (by C-G formula based on SCr of 8.84 mg/dL (H)).   Medical History: Past Medical History:  Diagnosis Date  . Anemia   . Anxiety   . Breast cancer ([place]) 01/2016   bilateral  . CHF (congestive heart failure) ([place])   . Chronic kidney disease   . Depression   . Dialysis patient ([place])   . DVT (deep venous thrombosis) ([facility])    left leg  . DVT (deep venous thrombosis) ([place]) 1985   right thigh  . Dysrhythmia   . Gout   . Headache   . HTN (hypertension)   . Hypertension   . Parathyroid abnormality ([place])   . Parathyroid disease ([place])   . Pneumonia 12/2015  . Psoriasis   . Renal insufficiency   . Sickle cell trait ([facility])    traits    Assessment: 65 yo female with PMH of CHF, DVT, ESRD on HD, HTN admitted with acute on chronic systolic CHF and hypertensive urgency. Pharmacy consulted for warfarin dosing and monitoring. INR therapeutic on admission at 2.6.   Home Regimen: Warfarin 5mg : M,W,F,Sa,Su                              Warfarin 7.5mg : T,Th  Hgb: low stable at 10.7 >> 9.9 PLTs: WNL  DATE INR DOSE 1/16 2.6 5    mg 1/17 1.9 7.5 mg 1/18 1.8 7.5 mg 1/19 1.5  Goal of Therapy:  INR 2-3   Plan:  --INR continues to down-trend. Unclear why; may be secondary to missed dose(s) prior to admission / vitamin K intake / absorption issues. Will dose 10 mg x 1 tonight --CBC at least every 3 days per protocol.  --Pharmacy will continue to monitor and order warfarin based on INR levels  [name] 01/26/2020 8:06 AM

## 2020-01-26 NOTE — Discharge Summary (Signed)
Physician Discharge Summary  Teresa Cook PIR:[id number] DOB: 1955/09/06 DOA: 01/22/2020  PCP: Murlean Iba, MD  Admit date: 01/22/2020 Discharge date: 01/26/2020  Admitted From: Home  Disposition:  Home   Recommendations for Outpatient Follow-up:  1. Follow up with Cardiology Dr. Nehemiah Massed in 1-2 weeks regarding reduced EF and dyspnea      Home Health: None  Equipment/Devices: None new  Discharge Condition: Fair  CODE STATUS: FULL Diet recommendation: Cardiac, renal  Brief/Interim Summary: Teresa Cook is a 65 y.o. F with sCHF, ESRD on HD TThS, HTN, and gout who presented with dyspnea on exertion.  In the ER, troponin low and flat, BNP elevated, CXR showed interstitial edema and work of breathing improved with BiPAP.     PRINCIPAL HOSPITAL DIAGNOSIS: Acute on chronic systolic CHF due to fluid overload    Discharge Diagnoses:   Acute on chronic systolic CHF likely secondary to fluid overload with end-stage renal disease Admitted and underwent serial dialysis.  Symptoms with some improvement.   Echo showed EF reduced back to 40-45%, from >55% last August, and to have regional wall motion abnormalities.  Cardiology were consulted regarding whether her reduced EF and dyspnea on exertion were anginal equivalents, but agreed that ischemia was unlikely.  They recommended dialysis and close follow up with Cardiology as an outpatient for ongoing symptoms, and titration of GDMT of CHF.       Hypertensive urgency Presented with very elevated BP.  This improved with HD and resuming home antihypertensives.  Depression Continued on sertraline, trazodone and Ambien  History of DVT  Continued on warfarin          Discharge Instructions   Allergies as of 01/26/2020      Reactions   Gabapentin Other (See Comments)   Seizure   Adhesive [tape] Itching   Silk tape is ok to use.      Medication List    TAKE these medications   aspirin-acetaminophen-caffeine  250-250-65 MG tablet Commonly known as: EXCEDRIN MIGRAINE Take 1-2 tablets by mouth every 6 (six) hours as needed for headache.   b complex-vitamin c-folic acid 0.8 MG Tabs tablet Take 1 tablet by mouth daily.   cinacalcet 60 MG tablet Commonly known as: SENSIPAR Take 60 mg by mouth daily before lunch.   isosorbide mononitrate 30 MG 24 hr tablet Commonly known as: IMDUR Take 1 tablet (30 mg total) by mouth daily.   metoprolol succinate 50 MG 24 hr tablet Commonly known as: TOPROL-XL Take 50 mg by mouth daily. Take with or immediately following a meal.   pantoprazole 40 MG tablet Commonly known as: PROTONIX Take 1 tablet (40 mg total) by mouth daily.   sertraline 100 MG tablet Commonly known as: ZOLOFT Take 150 mg by mouth daily.   sevelamer carbonate 800 MG tablet Commonly known as: RENVELA Take 800-2,400 mg by mouth See admin instructions. Take 3 tablets (2400mg ) by mouth three times daily before meals and take 1 tablet (800mg ) by mouth daily before a snack   traZODone 50 MG tablet Commonly known as: DESYREL Take 50 mg by mouth at bedtime. May increase to 2 tablets if needed.   warfarin 5 MG tablet Commonly known as: COUMADIN Take 5-7.5 mg by mouth See admin instructions. Take 1 tablet (5mg ) by mouth every Monday, Wednesday, Friday, Saturday and Sunday evening and take 1 tablets (7.5mg ) by mouth every Tuesday and Thursday evening   zolpidem 10 MG tablet Commonly known as: AMBIEN Take 10 mg by mouth at bedtime.  Allergies  Allergen Reactions  . Gabapentin Other (See Comments)    Seizure  . Adhesive [Tape] Itching    Silk tape is ok to use.    Consultations:  Cardiology  Nephrology   Procedures/Studies: DG Chest Portable 1 View  Result Date: 01/22/2020 CLINICAL DATA:  Respiratory distress. EXAM: PORTABLE CHEST 1 VIEW COMPARISON:  January 12, 2017 FINDINGS: There is stable left-sided venous catheter positioning. Mild to moderate severity diffusely  increased lung markings are seen. There is no evidence of a pleural effusion or pneumothorax. The heart size and mediastinal contours are within normal limits. There is marked severity calcification of the thoracic aorta. Moderate severity levoscoliosis of the lower thoracic spine is seen. IMPRESSION: Mild to moderate severity interstitial edema. Electronically Signed   By: Virgina Norfolk M.D.   On: 01/22/2020 21:59   ECHOCARDIOGRAM COMPLETE  Result Date: 01/23/2020    ECHOCARDIOGRAM REPORT   Patient Name:   Teresa Cook Date of Exam: 01/23/2020 Medical Rec #:  [id number]       Height:       65.0 in Accession #:    [id number]      Weight:       115.3 lb Date of Birth:  03-Sep-1955       BSA:          1.565 m Patient Age:    65 years        BP:           128/96 mmHg Patient Gender: F               HR:           85 bpm. Exam Location:  [facility] Procedure: 2D Echo, Color Doppler and Cardiac Doppler Indications:     G99.24 CHF-Acute Systolic  History:         Patient has prior history of Echocardiogram examinations. CHF,                  CKD; Risk Factors:Hypertension.  Sonographer:     Charmayne Sheer RDCS (AE) Referring Phys:  [id number] Arvella Merles [place] Diagnosing Phys: Bartholome Bill MD  Sonographer Comments: Suboptimal subcostal window. Image acquisition challenging due to respiratory motion. IMPRESSIONS  1. Left ventricular ejection fraction, by estimation, is 40 to 45%. Left ventricular ejection fraction by PLAX is 43 %. The left ventricle has mildly decreased function. The left ventricle demonstrates regional wall motion abnormalities (see scoring diagram/findings for description). There is moderate left ventricular hypertrophy. Left ventricular diastolic parameters were normal.  2. Right ventricular systolic function is normal. The right ventricular size is mildly enlarged.  3. Left atrial size was mildly dilated.  4. The mitral valve is grossly normal. Mild mitral valve regurgitation.  5. The aortic valve is calcified.  Aortic valve regurgitation is trivial. FINDINGS  Left Ventricle: Left ventricular ejection fraction, by estimation, is 40 to 45%. Left ventricular ejection fraction by PLAX is 43 %. The left ventricle has mildly decreased function. The left ventricle demonstrates regional wall motion abnormalities. The left ventricular internal cavity size was normal in size. There is moderate left ventricular hypertrophy. Left ventricular diastolic parameters were normal.  LV Wall Scoring: The mid anteroseptal segment and mid inferolateral segment are hypokinetic. Right Ventricle: The right ventricular size is mildly enlarged. No increase in right ventricular wall thickness. Right ventricular systolic function is normal. Left Atrium: Left atrial size was mildly dilated. Right Atrium: Right atrial size was normal in size. Pericardium: There is no evidence  of pericardial effusion. Mitral Valve: The mitral valve is grossly normal. Mild mitral valve regurgitation. MV peak gradient, 3.6 mmHg. The mean mitral valve gradient is 2.0 mmHg. Tricuspid Valve: The tricuspid valve is not well visualized. Tricuspid valve regurgitation is trivial. Aortic Valve: The aortic valve is calcified. Aortic valve regurgitation is trivial. Aortic valve mean gradient measures 3.0 mmHg. Aortic valve peak gradient measures 4.8 mmHg. Aortic valve area, by VTI measures 2.43 cm. Pulmonic Valve: The pulmonic valve was not well visualized. Pulmonic valve regurgitation is trivial. Aorta: The aortic root was not well visualized. IAS/Shunts: The interatrial septum was not well visualized.  LEFT VENTRICLE PLAX 2D LV EF:         Left            Diastology                ventricular     LV e' medial:    3.92 cm/s                ejection        LV E/e' medial:  22.1                fraction by     LV e' lateral:   6.20 cm/s                PLAX is 43      LV E/e' lateral: 14.0                %. LVIDd:         3.40 cm LVIDs:         2.70 cm LV PW:         0.90 cm LV IVS:         0.80 cm LVOT diam:     2.40 cm LV SV:         49 LV SV Index:   31 LVOT Area:     4.52 cm  RIGHT VENTRICLE RV Basal diam:  2.90 cm LEFT ATRIUM             Index       RIGHT ATRIUM           Index LA diam:        3.00 cm 1.92 cm/m  RA Area:     10.80 cm LA Vol (A2C):   66.4 ml 42.42 ml/m RA Volume:   23.60 ml  15.08 ml/m LA Vol (A4C):   44.9 ml 28.68 ml/m LA Biplane Vol: 56.2 ml 35.90 ml/m  AORTIC VALVE                   PULMONIC VALVE AV Area (Vmax):    2.71 cm    PV Vmax:       0.80 m/s AV Area (Vmean):   2.33 cm    PV Vmean:      55.500 cm/s AV Area (VTI):     2.43 cm    PV VTI:        0.150 m AV Vmax:           110.00 cm/s PV Peak grad:  2.6 mmHg AV Vmean:          87.300 cm/s PV Mean grad:  1.0 mmHg AV VTI:            0.201 m AV Peak Grad:      4.8 mmHg AV Mean Grad:      3.0 mmHg LVOT  Vmax:         65.90 cm/s LVOT Vmean:        44.900 cm/s LVOT VTI:          0.108 m LVOT/AV VTI ratio: 0.54  AORTA Ao Root diam: 3.60 cm MITRAL VALVE MV Area (PHT): 5.50 cm    SHUNTS MV Area VTI:   2.13 cm    Systemic VTI:  0.11 m MV Peak grad:  3.6 mmHg    Systemic Diam: 2.40 cm MV Mean grad:  2.0 mmHg MV Vmax:       0.95 m/s MV Vmean:      73.1 cm/s MV Decel Time: 138 msec MV E velocity: 86.50 cm/s MV A velocity: 81.80 cm/s MV E/A ratio:  1.06 Bartholome Bill MD Electronically signed by Bartholome Bill MD Signature Date/Time: 01/23/2020/11:05:01 AM    Final        Subjective: Feeling better.  No swelling. No dyspnea, no chest pain.  No fever.  No further feelings of face flushing or nausea.    Discharge Exam: Vitals:   01/26/20 0828 01/26/20 1200  BP: 124/85 116/83  Pulse: 78   Resp: (!) 22 (!) 21  Temp: 99.1 F (37.3 C) 98.1 F (36.7 C)  SpO2: 96%    Vitals:   01/26/20 0151 01/26/20 0409 01/26/20 0828 01/26/20 1200  BP:  103/76 124/85 116/83  Pulse:  77 78   Resp:   (!) 22 (!) 21  Temp:  99.6 F (37.6 C) 99.1 F (37.3 C) 98.1 F (36.7 C)  TempSrc:  Oral Oral Oral  SpO2:  94% 96%    Weight: 59.5 kg     Height:        General: Pt is alert, awake, not in acute distress Cardiovascular: RRR, nl S1-S2, no murmurs appreciated.   No LE edema.   Respiratory: Normal respiratory rate and rhythm.  CTAB without rales or wheezes. Abdominal: Abdomen soft and non-tender.  No distension or HSM.   Neuro/Psych: Strength symmetric in upper and lower extremities.  Judgment and insight appear normal.   The results of significant diagnostics from this hospitalization (including imaging, microbiology, ancillary and laboratory) are listed below for reference.     Microbiology: Recent Results (from the past 240 hour(s))  Resp panel by RT-PCR (RSV, Flu A&B, Covid) Nasopharyngeal Swab     Status: None   Collection Time: 01/22/20  9:28 PM   Specimen: Nasopharyngeal Swab; Nasopharyngeal(NP) swabs in vial transport medium  Result Value Ref Range Status   SARS Coronavirus 2 by RT PCR NEGATIVE NEGATIVE Final    Comment: (NOTE) SARS-CoV-2 target nucleic acids are NOT DETECTED.  The SARS-CoV-2 RNA is generally detectable in upper respiratory specimens during the acute phase of infection. The lowest concentration of SARS-CoV-2 viral copies this assay can detect is 138 copies/mL. A negative result does not preclude SARS-Cov-2 infection and should not be used as the sole basis for treatment or other patient management decisions. A negative result may occur with  improper specimen collection/handling, submission of specimen other than nasopharyngeal swab, presence of viral mutation(s) within the areas targeted by this assay, and inadequate number of viral copies(<138 copies/mL). A negative result must be combined with clinical observations, patient history, and epidemiological information. The expected result is Negative.  Fact Sheet for Patients:  EntrepreneurPulse.com.au  Fact Sheet for Healthcare Providers:  IncredibleEmployment.be  This test is no t  yet approved or cleared by the [place] FDA and  has been authorized for detection  and/or diagnosis of SARS-CoV-2 by FDA under an Emergency Use Authorization (EUA). This EUA will remain  in effect (meaning this test can be used) for the duration of the COVID-19 declaration under Section 564(b)(1) of the Act, 21 U.S.C.section 360bbb-3(b)(1), unless the authorization is terminated  or revoked sooner.       Influenza A by PCR NEGATIVE NEGATIVE Final   Influenza B by PCR NEGATIVE NEGATIVE Final    Comment: (NOTE) The Xpert Xpress SARS-CoV-2/FLU/RSV plus assay is intended as an aid in the diagnosis of influenza from Nasopharyngeal swab specimens and should not be used as a sole basis for treatment. Nasal washings and aspirates are unacceptable for Xpert Xpress SARS-CoV-2/FLU/RSV testing.  Fact Sheet for Patients: EntrepreneurPulse.com.au  Fact Sheet for Healthcare Providers: IncredibleEmployment.be  This test is not yet approved or cleared by the [place] FDA and has been authorized for detection and/or diagnosis of SARS-CoV-2 by FDA under an Emergency Use Authorization (EUA). This EUA will remain in effect (meaning this test can be used) for the duration of the COVID-19 declaration under Section 564(b)(1) of the Act, 21 U.S.C. section 360bbb-3(b)(1), unless the authorization is terminated or revoked.     Resp Syncytial Virus by PCR NEGATIVE NEGATIVE Final    Comment: (NOTE) Fact Sheet for Patients: EntrepreneurPulse.com.au  Fact Sheet for Healthcare Providers: IncredibleEmployment.be  This test is not yet approved or cleared by the [place] FDA and has been authorized for detection and/or diagnosis of SARS-CoV-2 by FDA under an Emergency Use Authorization (EUA). This EUA will remain in effect (meaning this test can be used) for the duration of the COVID-19 declaration under Section 564(b)(1)  of the Act, 21 U.S.C. section 360bbb-3(b)(1), unless the authorization is terminated or revoked.  Performed at [facility], [address] [place], [place] [address]      Labs: BNP (last 3 results) Recent Labs    01/22/20 2128 01/25/20 0604  BNP 871.9* 735.3*   Basic Metabolic Panel: Recent Labs  Lab 01/22/20 2128 01/23/20 0150 01/23/20 0437 01/24/20 0457  NA 141  --  140 140  K 4.5  --  3.7 4.5  CL 102  --  103 102  CO2 22  --  25 25  GLUCOSE 122*  --  98 91  BUN 47*  --  27* 52*  CREATININE 8.13* 5.73* 5.73* 8.84*  CALCIUM 9.1  --  8.9 9.2  PHOS  --  2.9  --   --    Liver Function Tests: Recent Labs  Lab 01/22/20 2128 01/23/20 0437  AST 31 26  ALT 27 26  ALKPHOS 113 91  BILITOT 0.6 0.8  PROT 8.0 7.2  ALBUMIN 4.0 3.7   No results for input(s): LIPASE, AMYLASE in the last 168 hours. No results for input(s): AMMONIA in the last 168 hours. CBC: Recent Labs  Lab 01/22/20 2128 01/23/20 0150 01/23/20 0437 01/26/20 0539  WBC 11.7* 10.1 10.1 6.9  NEUTROABS 9.9*  --   --   --   HGB 10.6* 10.7* 10.7* 9.9*  HCT 32.4* 33.1* 32.3* 30.0*  MCV 89.3 87.3 86.8 86.5  PLT 206 224 204 207   Cardiac Enzymes: No results for input(s): CKTOTAL, CKMB, CKMBINDEX, TROPONINI in the last 168 hours. BNP: Invalid input(s): POCBNP CBG: No results for input(s): GLUCAP in the last 168 hours. D-Dimer No results for input(s): DDIMER in the last 72 hours. Hgb A1c No results for input(s): HGBA1C in the last 72 hours. Lipid Profile No results for  input(s): CHOL, HDL, LDLCALC, TRIG, CHOLHDL, LDLDIRECT in the last 72 hours. Thyroid function studies No results for input(s): TSH, T4TOTAL, T3FREE, THYROIDAB in the last 72 hours.  Invalid input(s): FREET3 Anemia work up No results for input(s): VITAMINB12, FOLATE, FERRITIN, TIBC, IRON, RETICCTPCT in the last 72 hours. Urinalysis    Component Value Date/Time   COLORURINE STRAW (A) 09/09/2016 1521   APPEARANCEUR  CLEAR (A) 09/09/2016 1521   APPEARANCEUR Clear 09/08/2013 0133   LABSPEC 1.008 09/09/2016 1521   LABSPEC 1.013 09/08/2013 0133   PHURINE 8.0 09/09/2016 1521   GLUCOSEU 50 (A) 09/09/2016 1521   GLUCOSEU 50 mg/dL 09/08/2013 0133   HGBUR NEGATIVE 09/09/2016 1521   BILIRUBINUR NEGATIVE 09/09/2016 1521   BILIRUBINUR Negative 09/08/2013 0133   KETONESUR NEGATIVE 09/09/2016 1521   PROTEINUR 100 (A) 09/09/2016 1521   NITRITE NEGATIVE 09/09/2016 1521   LEUKOCYTESUR NEGATIVE 09/09/2016 1521   LEUKOCYTESUR Negative 09/08/2013 0133   Sepsis Labs Invalid input(s): PROCALCITONIN,  WBC,  LACTICIDVEN Microbiology Recent Results (from the past 240 hour(s))  Resp panel by RT-PCR (RSV, Flu A&B, Covid) Nasopharyngeal Swab     Status: None   Collection Time: 01/22/20  9:28 PM   Specimen: Nasopharyngeal Swab; Nasopharyngeal(NP) swabs in vial transport medium  Result Value Ref Range Status   SARS Coronavirus 2 by RT PCR NEGATIVE NEGATIVE Final    Comment: (NOTE) SARS-CoV-2 target nucleic acids are NOT DETECTED.  The SARS-CoV-2 RNA is generally detectable in upper respiratory specimens during the acute phase of infection. The lowest concentration of SARS-CoV-2 viral copies this assay can detect is 138 copies/mL. A negative result does not preclude SARS-Cov-2 infection and should not be used as the sole basis for treatment or other patient management decisions. A negative result may occur with  improper specimen collection/handling, submission of specimen other than nasopharyngeal swab, presence of viral mutation(s) within the areas targeted by this assay, and inadequate number of viral copies(<138 copies/mL). A negative result must be combined with clinical observations, patient history, and epidemiological information. The expected result is Negative.  Fact Sheet for Patients:  EntrepreneurPulse.com.au  Fact Sheet for Healthcare Providers:   IncredibleEmployment.be  This test is no t yet approved or cleared by the [place] FDA and  has been authorized for detection and/or diagnosis of SARS-CoV-2 by FDA under an Emergency Use Authorization (EUA). This EUA will remain  in effect (meaning this test can be used) for the duration of the COVID-19 declaration under Section 564(b)(1) of the Act, 21 U.S.C.section 360bbb-3(b)(1), unless the authorization is terminated  or revoked sooner.       Influenza A by PCR NEGATIVE NEGATIVE Final   Influenza B by PCR NEGATIVE NEGATIVE Final    Comment: (NOTE) The Xpert Xpress SARS-CoV-2/FLU/RSV plus assay is intended as an aid in the diagnosis of influenza from Nasopharyngeal swab specimens and should not be used as a sole basis for treatment. Nasal washings and aspirates are unacceptable for Xpert Xpress SARS-CoV-2/FLU/RSV testing.  Fact Sheet for Patients: EntrepreneurPulse.com.au  Fact Sheet for Healthcare Providers: IncredibleEmployment.be  This test is not yet approved or cleared by the [place] FDA and has been authorized for detection and/or diagnosis of SARS-CoV-2 by FDA under an Emergency Use Authorization (EUA). This EUA will remain in effect (meaning this test can be used) for the duration of the COVID-19 declaration under Section 564(b)(1) of the Act, 21 U.S.C. section 360bbb-3(b)(1), unless the authorization is terminated or revoked.     Resp Syncytial Virus by PCR NEGATIVE  NEGATIVE Final    Comment: (NOTE) Fact Sheet for Patients: EntrepreneurPulse.com.au  Fact Sheet for Healthcare Providers: IncredibleEmployment.be  This test is not yet approved or cleared by the [place] FDA and has been authorized for detection and/or diagnosis of SARS-CoV-2 by FDA under an Emergency Use Authorization (EUA). This EUA will remain in effect (meaning this test can be used) for  the duration of the COVID-19 declaration under Section 564(b)(1) of the Act, 21 U.S.C. section 360bbb-3(b)(1), unless the authorization is terminated or revoked.  Performed at [facility], [address] [place], [place] [address]      Time coordinating discharge: 45 minutes The [place] controlled substances registry was reviewed for this patient prior to filling the <5 days supply controlled substances script.      SIGNED:   Edwin Dada, MD  [facility] 01/26/2020, 1:14 PM

## 2020-02-29 ENCOUNTER — Telehealth: Payer: Self-pay

## 2020-02-29 ENCOUNTER — Ambulatory Visit (INDEPENDENT_AMBULATORY_CARE_PROVIDER_SITE_OTHER): Payer: Medicare Other | Admitting: Cardiology

## 2020-02-29 ENCOUNTER — Other Ambulatory Visit: Payer: Self-pay

## 2020-02-29 ENCOUNTER — Encounter: Payer: Self-pay | Admitting: Cardiology

## 2020-02-29 VITALS — BP 110/70 | HR 80 | Ht 65.0 in | Wt 129.0 lb

## 2020-02-29 DIAGNOSIS — I5020 Unspecified systolic (congestive) heart failure: Secondary | ICD-10-CM

## 2020-02-29 DIAGNOSIS — I480 Paroxysmal atrial fibrillation: Secondary | ICD-10-CM

## 2020-02-29 DIAGNOSIS — E7800 Pure hypercholesterolemia, unspecified: Secondary | ICD-10-CM

## 2020-02-29 MED ORDER — ENTRESTO 24-26 MG PO TABS: 1.0000 | ORAL_TABLET | Freq: Two times a day (BID) | ORAL | 3 refills | Status: DC

## 2020-02-29 NOTE — Progress Notes (Unsigned)
Cardiology Office Note:    Date:  03/01/2020   ID:  Teresa Cook, DOB 05/06/1955, MRN [id number]  PCP:  Murlean Iba, MD   [place]  Cardiologist:  No primary care provider on file.  Advanced Practice Provider:  No care team member to display Electrophysiologist:  None       Referring MD: Murlean Iba, MD   Chief Complaint  Patient presents with  . New Patient (Initial Visit)    Referred by PCP for SOB and CHF. Meds reviewed verbally with patient.     History of Present Illness:    Teresa Cook is a 65 y.o. female with a hx of hypertension, ESRD on HD MWF, paroxysmal atrial fibrillation on Coumadin, presenting due to shortness of breath and concerns of heart failure.  Patient previously seen by [facility] cardiology, patient will like to switch care to [facility].  She presented to the hospital 01/21/2018 2012 with dyspnea on exertion.  Work-up with chest x-ray showed interstitial edema.  Diagnosed with volume overload, she was managed with BiPAP and dialysis for volume control.  Echo 01/23/2020 showed a moderately reduced ejection fraction of 40%.  Aortic valve calcification noted.  No significant stenosis.  Previous outside echocardiogram 09/02/2018 showed a documented EF of 55%.  Outside myocardial perfusion study on 09/02/2019 with no evidence of ischemia.  Since her discharge from the hospital, she has felt well.  She follows up with nephrology office for INR checks.  Past Medical History:  Diagnosis Date  . Anemia   . Anxiety   . Breast cancer ([place]) 01/2016   bilateral  . CHF (congestive heart failure) ([place])   . Chronic kidney disease   . Depression   . Dialysis patient ([place])   . DVT (deep venous thrombosis) (H[facility])    left leg  . DVT (deep venous thrombosis) ([place]) 1985   right thigh  . Dysrhythmia   . Gout   . Headache   . HTN (hypertension)   . Hypertension   . Parathyroid abnormality ([place])   . Parathyroid disease ([place])   . Pneumonia  12/2015  . Psoriasis   . Renal insufficiency   . Sickle cell trait ([place])    traits    Past Surgical History:  Procedure Laterality Date  . ABDOMINAL HYSTERECTOMY  1980  . APPENDECTOMY    . BREAST BIOPSY Left 10/28/2013   benign  . BREAST EXCISIONAL BIOPSY Left 2002   benign  . INSERTION OF DIALYSIS CATHETER  2014  . LIPOMA EXCISION N/A 01/23/2016   Procedure: EXCISION LIPOMA;  Surgeon: Hubbard Robinson, MD;  Location: [facility] ORS;  Service: General;  Laterality: N/A;  . MASTECTOMY W/ SENTINEL NODE BIOPSY Bilateral 01/23/2016   Procedure: bilateral MASTECTOMY WITH  bilateral SENTINEL LYMPH NODE BIOPSY possible left axillary node dissection forehead lipoma removal;  Surgeon: Hubbard Robinson, MD;  Location: [facility] ORS;  Service: General;  Laterality: Bilateral;  . PARTIAL HYSTERECTOMY    . PERIPHERAL VASCULAR CATHETERIZATION N/A 12/25/2015   Procedure: Dialysis/Perma Catheter Insertion;  Surgeon: Algernon Huxley, MD;  Location: [place] [facility];  Service: Cardiovascular;  Laterality: N/A;  . PERIPHERAL VASCULAR CATHETERIZATION Left 01/22/2016   Procedure: Dialysis/Perma Catheter Insertion;  Surgeon: Algernon Huxley, MD;  Location: [facility];  Service: Cardiovascular;  Laterality: Left;  . PERIPHERAL VASCULAR CATHETERIZATION N/A 01/26/2016   Procedure: Dialysis/Perma Catheter Insertion;  Surgeon: Katha Cabal, MD;  Location: [place] [facility];  Service: Cardiovascular;  Laterality: N/A;  .  PORT-A-CATH REMOVAL N/A 12/20/2015   Procedure: REMOVAL PORT-A-CATH;  Surgeon: Hubbard Robinson, MD;  Location: [facility] ORS;  Service: General;  Laterality: N/A;  left    . PORTACATH PLACEMENT Left 08/21/2015   Procedure: INSERTION PORT-A-CATH;  Surgeon: Hubbard Robinson, MD;  Location: [facility] ORS;  Service: General;  Laterality: Left;  . REMOVAL OF A DIALYSIS CATHETER  2017    Current Medications: Current Meds  Medication Sig  . aspirin-acetaminophen-caffeine (EXCEDRIN MIGRAINE)  673-419-37 MG tablet Take 1-2 tablets by mouth every 6 (six) hours as needed for headache.  . b complex-vitamin c-folic acid (NEPHRO-VITE) 0.8 MG TABS tablet Take 1 tablet by mouth daily.  . cinacalcet (SENSIPAR) 60 MG tablet Take 60 mg by mouth daily before lunch.  . metoprolol succinate (TOPROL-XL) 50 MG 24 hr tablet Take 1 tablet (50 mg total) by mouth daily. Take with or immediately following a meal.  . pantoprazole (PROTONIX) 40 MG tablet Take 1 tablet (40 mg total) by mouth daily.  . sacubitril-valsartan (ENTRESTO) 24-26 MG Take 1 tablet by mouth 2 (two) times daily.  . sertraline (ZOLOFT) 100 MG tablet Take 1.5 tablets (150 mg total) by mouth daily.  . sevelamer carbonate (RENVELA) 800 MG tablet Take 800-2,400 mg by mouth See admin instructions. Take 3 tablets (2400mg ) by mouth three times daily before meals and take 1 tablet (800mg ) by mouth daily before a snack  . traZODone (DESYREL) 50 MG tablet Take 1 tablet (50 mg total) by mouth at bedtime. May increase to 2 tablets if needed.  . warfarin (COUMADIN) 5 MG tablet Take 1-1.5 tablets (5-7.5 mg total) by mouth See admin instructions. Take 1 tablet (5mg ) by mouth every Monday, Wednesday, Friday, Saturday and Sunday evening and take 1.5  tablets (7.5mg ) by mouth every Tuesday and Thursday evening  . zolpidem (AMBIEN) 10 MG tablet Take 1 tablet (10 mg total) by mouth at bedtime.  . [DISCONTINUED] isosorbide mononitrate (IMDUR) 30 MG 24 hr tablet Take 1 tablet (30 mg total) by mouth daily.     Allergies:   Gabapentin and Adhesive [tape]   Social History   Socioeconomic History  . Marital status: Widowed    Spouse name: Not on file  . Number of children: Not on file  . Years of education: Not on file  . Highest education level: Not on file  Occupational History  . Occupation: disabled  Tobacco Use  . Smoking status: Never Smoker  . Smokeless tobacco: Never Used  Vaping Use  . Vaping Use: Never used  Substance and Sexual Activity  .  Alcohol use: No    Alcohol/week: 0.0 standard drinks  . Drug use: No  . Sexual activity: Never    Birth control/protection: Surgical  Other Topics Concern  . Not on file  Social History Narrative   ** Merged History Encounter **       Social Determinants of Health   Financial Resource Strain: Not on file  Food Insecurity: Not on file  Transportation Needs: Not on file  Physical Activity: Not on file  Stress: Not on file  Social Connections: Not on file     Family History: The patient's family history includes Breast cancer (age of onset: 90) in her maternal aunt; CVA in her mother; Cancer (age of onset: 88) in her sister; Diabetes in her sister; Hypertension in her brother, father, mother, and sister; Stroke in her brother and mother.  ROS:   Please see the history of present illness.  All other systems reviewed and are negative.  EKGs/Labs/Other Studies Reviewed:    The following studies were reviewed today:   EKG:  EKG is  ordered today.  The ekg ordered today demonstrates normal sinus rhythm  Recent Labs: 01/23/2020: ALT 26 01/25/2020: B Natriuretic Peptide 392.7 02/29/2020: BUN 40; Creatinine, Ser 8.94; Hemoglobin 11.8; Platelets 194; Potassium 4.8; Sodium 140  Recent Lipid Panel    Component Value Date/Time   CHOL 247 (H) 02/29/2020 1040   CHOL 250 (H) 02/03/2014 0135   TRIG 156 (H) 02/29/2020 1040   TRIG 144 02/03/2014 0135   HDL 49 02/29/2020 1040   HDL 35 (L) 02/03/2014 0135   CHOLHDL 5.0 (H) 02/29/2020 1040   VLDL 29 02/03/2014 0135   LDLCALC 169 (H) 02/29/2020 1040   LDLCALC 186 (H) 02/03/2014 0135     Risk Assessment/Calculations:      Physical Exam:    VS:  BP 110/70 (BP Location: Right Arm, Patient Position: Sitting, Cuff Size: Normal)   Pulse 80   Ht 5\' 5"  (1.651 m)   Wt 129 lb (58.5 kg)   SpO2 97%   BMI 21.47 kg/m     Wt Readings from Last 3 Encounters:  02/29/20 129 lb (58.5 kg)  01/26/20 131 lb 1.6 oz (59.5 kg)  02/11/17 115 lb  4.8 oz (52.3 kg)     GEN:  Well nourished, well developed in no acute distress HEENT: Normal NECK: No JVD; No carotid bruits LYMPHATICS: No lymphadenopathy CARDIAC: RRR, no murmurs, rubs, gallops RESPIRATORY:  Clear to auscultation without rales, wheezing or rhonchi  ABDOMEN: Soft, non-tender, non-distended MUSCULOSKELETAL:  No edema; No deformity  SKIN: Warm and dry NEUROLOGIC:  Alert and oriented x 3 PSYCHIATRIC:  Normal affect   ASSESSMENT:    1. HFrEF (heart failure with reduced ejection fraction) (HCC)   2. Paroxysmal atrial fibrillation (HCC)   3. Pure hypercholesterolemia    PLAN:    In order of problems listed above:  1. Patient with HFrEF, NYHA class II-III symptoms.  Appears euvolemic.  Echocardiogram reviewed by myself showing moderately reduced EF, 35 to 40%.  We will schedule patient for right and left heart cath.  Continue Toprol-XL 50 mg daily, start Entresto 24-26. Hold imdur for now. 2. Paroxysmal atrial fibrillation, currently in sinus rhythm.  Continue Toprol-XL, warfarin.  Follows up with nephrology office for frequent INR checks. 3. Hyperlipidemia, obtain fasting lipid profile.    Follow-up after left heart catheter   Shared Decision Making/Informed Consent The risks [stroke (1 in 1000), death (1 in 1000), kidney failure [usually temporary] (1 in 500), bleeding (1 in 200), allergic reaction [possibly serious] (1 in 200)], benefits (diagnostic support and management of coronary artery disease) and alternatives of a cardiac catheterization were discussed in detail with Ms. Duvall and she is willing to proceed.      Medication Adjustments/Labs and Tests Ordered: Current medicines are reviewed at length with the patient today.  Concerns regarding medicines are outlined above.  Orders Placed This Encounter  Procedures  . CBC  . Lipid panel  . Basic metabolic panel  . EKG 12-Lead   Meds ordered this encounter  Medications  . sacubitril-valsartan  (ENTRESTO) 24-26 MG    Sig: Take 1 tablet by mouth 2 (two) times daily.    Dispense:  60 tablet    Refill:  3    Patient Instructions  Medication Instructions:   Your physician has recommended you make the following change in your medication:   1.  STOP taking your IMDUR. 2.  START taking Entresto 24-26 MG: Take 1 tablet by mouth twice a day.  *If you need a refill on your cardiac medications before your next appointment, please call your pharmacy*   Lab Work:  CBC, BMP, LIPID Panel  Drawn today  COVID PRE- TEST: You will need a COVID TEST prior to the procedure:             LOCATION: [place] [facility]-Op [facility]-Thru Testing site.             DATE/TIME:  Fri 03/03/20  (anytime between 8 am and 1 pm)    Testing/Procedures:  [facility] Cardiac Cath Instructions   You are scheduled for a Cardiac Cath on:_____Tues. March 1st with Dr. End_____  Please arrive at _0830______am on the day of your procedure  Please expect a call from our [place] to pre-register you  Do not eat/drink anything after midnight  Someone will need to drive you home  It is recommended someone be with you for the first 24 hours after your procedure  Wear clothes that are easy to get on/off and wear slip on shoes if possible   Medications bring a current list of all medications with you  ___ You may take all of your medications the morning of your procedure with enough water to swallow safely  _XX__ Do not take these medications before your procedure: Entresto (day of procedure)  _XX__ Do not take your Warfarin/Coumadin for 4 days prior (Starting Friday 03/03/20)    Day of your procedure: Arrive at the [facility] entrance.  Free valet service is available.  After entering the [facility] please check-in at the registration desk (1st desk on your right) to receive your armband. After receiving your armband someone will escort you to the cardiac cath/special  procedures waiting area.  The usual length of stay after your procedure is about 2 to 3 hours.  This can vary.  If you have any questions, please call our office at [phone], or you may call the cardiac cath lab at [facility] directly at [place]- TEST: You will need a COVID TEST prior to the procedure:  LOCATION: [place] Pre-Op [facility] [address]-Thru Testing site.  DATE/TIME:  Friday 03/03/20 (8:00 am- 10:00 am)   Follow-Up: At [facility], you and your health needs are our priority.  As part of our continuing mission to provide you with exceptional heart care, we have created designated Provider Care Teams.  These Care Teams include your primary Cardiologist (physician) and Advanced Practice Providers (APPs -  Physician Assistants and Nurse Practitioners) who all work together to provide you with the care you need, when you need it.  We recommend signing up for the patient portal called "MyChart".  Sign up information is provided on this After Visit Summary.  MyChart is used to connect with patients for Virtual Visits (Telemedicine).  Patients are able to view lab/test results, encounter notes, upcoming appointments, etc.  Non-urgent messages can be sent to your provider as well.   To learn more about what you can do with MyChart, go to [email or web address].    Your next appointment:   2 week(s)  The format for your next appointment:   In Person  Provider:   Kate Sable, MD  ONLY  Other Instructions      Signed, Kate Sable, MD  03/01/2020 8:02 AM    [place]

## 2020-02-29 NOTE — Patient Instructions (Signed)
Medication Instructions:   Your physician has recommended you make the following change in your medication:   1.  STOP taking your IMDUR. 2.  START taking Entresto 24-26 MG: Take 1 tablet by mouth twice a day.  *If you need a refill on your cardiac medications before your next appointment, please call your pharmacy*   Lab Work:  CBC, BMP, LIPID Panel  Drawn today  COVID PRE- TEST: You will need a COVID TEST prior to the procedure:             LOCATION: [place] Pre-Op [facility] [address]-Thru Testing site.             DATE/TIME:  Fri 03/03/20  (anytime between 8 am and 1 pm)    Testing/Procedures:  [facility] Cardiac Cath Instructions   You are scheduled for a Cardiac Cath on:_____Tues. March 1st with Dr. End_____  Please arrive at _0830______am on the day of your procedure  Please expect a call from our [place] to pre-register you  Do not eat/drink anything after midnight  Someone will need to drive you home  It is recommended someone be with you for the first 24 hours after your procedure  Wear clothes that are easy to get on/off and wear slip on shoes if possible   Medications bring a current list of all medications with you  ___ You may take all of your medications the morning of your procedure with enough water to swallow safely  _XX__ Do not take these medications before your procedure: Entresto (day of procedure)  _XX__ Do not take your Warfarin/Coumadin for 4 days prior (Starting Friday 03/03/20)    Day of your procedure: Arrive at the [facility] entrance.  Free valet service is available.  After entering the [place] please check-in at the registration desk (1st desk on your right) to receive your armband. After receiving your armband someone will escort you to the cardiac cath/special procedures waiting area.  The usual length of stay after your procedure is about 2 to 3 hours.  This can vary.  If you have any questions,  please call our office at [phone], or you may call the cardiac cath lab at [facility] directly at [place]- TEST: You will need a COVID TEST prior to the procedure:  LOCATION: [place] Pre-Op Admission Drive-Thru Testing site.  DATE/TIME:  Friday 03/03/20 (8:00 am- 10:00 am)   Follow-Up: At [facility], you and your health needs are our priority.  As part of our continuing mission to provide you with exceptional heart care, we have created designated Provider Care Teams.  These Care Teams include your primary Cardiologist (physician) and Advanced Practice Providers (APPs -  Physician Assistants and Nurse Practitioners) who all work together to provide you with the care you need, when you need it.  We recommend signing up for the patient portal called "MyChart".  Sign up information is provided on this After Visit Summary.  MyChart is used to connect with patients for Virtual Visits (Telemedicine).  Patients are able to view lab/test results, encounter notes, upcoming appointments, etc.  Non-urgent messages can be sent to your provider as well.   To learn more about what you can do with MyChart, go to [email or web address].    Your next appointment:   2 week(s)  The format for your next appointment:   In Person  Provider:   Kate Sable, MD  ONLY  Other Instructions

## 2020-02-29 NOTE — Telephone Encounter (Signed)
Patient seen in clinic today and was recommended to stop IMDUR and Start Entresto. Patient stated that she wanted to wait until after her cath lab procedure to start [facility], she would like to continue on IMDUR until then. She was also wanting to talk to her family about the Cath lab procedure, she explained to me that she is mostly hesitant to have it done because she does not want to be covid tested. I explained about the policy and how the testing is done and she is going to think about it and call me back if she decides to cancel.

## 2020-03-01 LAB — BASIC METABOLIC PANEL
BUN/Creatinine Ratio: 4 — ABNORMAL LOW (ref 12–28)
BUN: 40 mg/dL — ABNORMAL HIGH (ref 8–27)
CO2: 21 mmol/L (ref 20–29)
Calcium: 9.3 mg/dL (ref 8.7–10.3)
Chloride: 97 mmol/L (ref 96–106)
Creatinine, Ser: 8.94 mg/dL — ABNORMAL HIGH (ref 0.57–1.00)
GFR calc Af Amer: 5 mL/min/{1.73_m2} — ABNORMAL LOW (ref >59)
GFR calc non Af Amer: 4 mL/min/{1.73_m2} — ABNORMAL LOW (ref >59)
Glucose: 83 mg/dL (ref 65–99)
Potassium: 4.8 mmol/L (ref 3.5–5.2)
Sodium: 140 mmol/L (ref 134–144)

## 2020-03-01 LAB — CBC
Hematocrit: 36.8 % (ref 34.0–46.6)
Hemoglobin: 11.8 g/dL (ref 11.1–15.9)
MCH: 28.8 pg (ref 26.6–33.0)
MCHC: 32.1 g/dL (ref 31.5–35.7)
MCV: 90 fL (ref 79–97)
Platelets: 194 10*3/uL (ref 150–450)
RBC: 4.1 x10E6/uL (ref 3.77–5.28)
RDW: 15.8 % — ABNORMAL HIGH (ref 11.7–15.4)
WBC: 4.9 10*3/uL (ref 3.4–10.8)

## 2020-03-01 LAB — LIPID PANEL
Chol/HDL Ratio: 5 ratio — ABNORMAL HIGH (ref 0.0–4.4)
Cholesterol, Total: 247 mg/dL — ABNORMAL HIGH (ref 100–199)
HDL: 49 mg/dL (ref >39)
LDL Chol Calc (NIH): 169 mg/dL — ABNORMAL HIGH (ref 0–99)
Triglycerides: 156 mg/dL — ABNORMAL HIGH (ref 0–149)
VLDL Cholesterol Cal: 29 mg/dL (ref 5–40)

## 2020-03-02 ENCOUNTER — Telehealth: Payer: Self-pay

## 2020-03-02 NOTE — Telephone Encounter (Signed)
Spoke to patient and gave her the below result note and recommendations. Patient stated that she is not willing to start another medication and does not want to take Lipitor. She stated that she just started eliminating fried foods from her diet as recommended by her dialysis team and we reviewed other dietary changes she could make to reduce her cholesterol.   Will route to Dr. Garen Lah as an [place].

## 2020-03-02 NOTE — Telephone Encounter (Signed)
-----   Message from Kate Sable, MD sent at 03/01/2020  5:59 PM EST ----- Hemoglobin okay, okay to proceed with left heart cath whenever patient is ready/agreeable.  Cholesterol values elevated.  Recommend starting Lipitor 40 mg daily to help control lipid values.

## 2020-03-03 ENCOUNTER — Encounter: Payer: Self-pay | Admitting: Cardiology

## 2020-03-03 ENCOUNTER — Other Ambulatory Visit: Admission: RE | Admit: 2020-03-03 | Payer: Medicare Other | Source: Ambulatory Visit

## 2020-03-07 ENCOUNTER — Encounter: Admission: RE | Payer: Self-pay | Source: Home / Self Care

## 2020-03-07 ENCOUNTER — Ambulatory Visit: Admission: RE | Admit: 2020-03-07 | Payer: Medicare Other | Source: Home / Self Care | Admitting: Internal Medicine

## 2020-03-07 DIAGNOSIS — I5020 Unspecified systolic (congestive) heart failure: Secondary | ICD-10-CM

## 2020-03-07 SURGERY — RIGHT/LEFT HEART CATH AND CORONARY ANGIOGRAPHY
Anesthesia: Moderate Sedation | Laterality: Bilateral

## 2020-03-16 ENCOUNTER — Telehealth: Payer: Self-pay | Admitting: Cardiology

## 2020-03-16 DIAGNOSIS — I428 Other cardiomyopathies: Secondary | ICD-10-CM

## 2020-03-16 NOTE — Telephone Encounter (Signed)
Patient has questions about upcoming procedure .

## 2020-03-17 NOTE — Telephone Encounter (Signed)
Patient calling to check on status.

## 2020-03-20 NOTE — Telephone Encounter (Signed)
Called patient back and was unable to leave a message, there was a message stating that no VM has been set up.

## 2020-03-20 NOTE — Telephone Encounter (Signed)
Patient returning call.

## 2020-03-21 NOTE — Telephone Encounter (Signed)
Spoke with patient and she is ready to schedule the R/L Heart Cath that she previously cancelled. She stated that she is very worried that she is not a good candidate due to her centrally placed HD catheter, and her rare disease, FSGS (focal segmental glomerulosclerosis. I informed her they are able to thread the catheter up from the groin as an option if needed.  Patient wants reassurance from Dr. Garen Lah that this will be a safe procedure with knowing the above mentioned concerns.    Patient did state that she stopped her Imdur and started Entresto. She had been previously waiting to do that after her original scheduled Cath lab procedure on 03/07/20.    Will route to Dr. Garen Lah for review and then proceed with rescheduling Cath Lab.

## 2020-03-23 ENCOUNTER — Ambulatory Visit: Payer: Medicare Other | Admitting: Cardiology

## 2020-03-24 NOTE — Telephone Encounter (Signed)
Called patient back after receiving the following note from Dr. Garen Lah:  Teresa Sable, MD  You 2 days ago   Noted . please schedule left and right heart cath. Thank you    I discussed at length the procedure of the Catheterization and that it would be safe with her HD Catheter. Patient stated that she still wants to think about it and is discussing it with her family over the weekend. She stated that she will notify me early next week with her decision wether to proceed with it or not.

## 2020-03-28 ENCOUNTER — Telehealth: Payer: Self-pay | Admitting: Cardiology

## 2020-03-28 NOTE — Telephone Encounter (Signed)
Patient would like to be called to schedule appt

## 2020-03-28 NOTE — Telephone Encounter (Signed)
error 

## 2020-03-28 NOTE — Telephone Encounter (Signed)
Called patient and she stated that after talking with her children, she was ready for the cath lab procedure. I called and scheduled her with Dr.Arida for Tuesday 04/04/20 at 0930. I informed patient that she will need to get blood work done prior to her visit and that she will need to get a COVID test on the Friday prior (03/31/20). Patient stated that she will come for both her lab draw and COVID testing on 03/31/20 around 11-1130am.   The following instructions were reviewed with the patient:    Do not eat/drink anything after midnight  Someone will need to drive you home  It is recommended someone be with you for the first 24 hours after your procedure  Wear clothes that are easy to get on/off and wear slip on shoes if possible   Medications bring a current list of all medications with you  ___ You may take all of your medications the morning of your procedure with enough water to swallow safely  _XX__ Do not take these medications before your procedure: Entresto (day of procedure)  _XX__ Do not take your Warfarin/Coumadin for 4 days prior (Starting Friday 03/03/20)   Patient verbalized understanding and agreed with plan.

## 2020-03-28 NOTE — Addendum Note (Signed)
Addended by: Kavin Leech on: 03/28/2020 04:54 PM   Modules accepted: Orders

## 2020-03-29 ENCOUNTER — Other Ambulatory Visit: Payer: Self-pay | Admitting: Cardiology

## 2020-03-29 DIAGNOSIS — I5020 Unspecified systolic (congestive) heart failure: Secondary | ICD-10-CM

## 2020-03-31 ENCOUNTER — Other Ambulatory Visit
Admission: RE | Admit: 2020-03-31 | Discharge: 2020-03-31 | Disposition: A | Discharge status: Home / Self Care | Payer: Medicare Other | Source: Ambulatory Visit | Attending: Cardiovascular Disease | Admitting: Cardiovascular Disease

## 2020-03-31 ENCOUNTER — Other Ambulatory Visit
Admission: RE | Admit: 2020-03-31 | Discharge: 2020-03-31 | Disposition: A | Discharge status: Home / Self Care | Payer: Medicare Other | Source: Home / Self Care | Attending: Cardiology | Admitting: Cardiology

## 2020-03-31 ENCOUNTER — Other Ambulatory Visit: Payer: Self-pay

## 2020-03-31 DIAGNOSIS — Z20822 Contact with and (suspected) exposure to covid-19: Secondary | ICD-10-CM | POA: Diagnosis not present

## 2020-03-31 DIAGNOSIS — I428 Other cardiomyopathies: Secondary | ICD-10-CM | POA: Insufficient documentation

## 2020-03-31 DIAGNOSIS — Z01812 Encounter for preprocedural laboratory examination: Secondary | ICD-10-CM | POA: Insufficient documentation

## 2020-03-31 LAB — BASIC METABOLIC PANEL
Anion gap: 14 (ref 5–15)
BUN: 25 mg/dL — ABNORMAL HIGH (ref 8–23)
CO2: 26 mmol/L (ref 22–32)
Calcium: 9.4 mg/dL (ref 8.9–10.3)
Chloride: 94 mmol/L — ABNORMAL LOW (ref 98–111)
Creatinine, Ser: 5.38 mg/dL — ABNORMAL HIGH (ref 0.44–1.00)
GFR, Estimated: 8 mL/min — ABNORMAL LOW (ref >60)
Glucose, Bld: 89 mg/dL (ref 70–99)
Potassium: 3.6 mmol/L (ref 3.5–5.1)
Sodium: 134 mmol/L — ABNORMAL LOW (ref 135–145)

## 2020-03-31 LAB — CBC
HCT: 37.1 % (ref 36.0–46.0)
Hemoglobin: 12.4 g/dL (ref 12.0–15.0)
MCH: 29.4 pg (ref 26.0–34.0)
MCHC: 33.4 g/dL (ref 30.0–36.0)
MCV: 87.9 fL (ref 80.0–100.0)
Platelets: 229 10*3/uL (ref 150–400)
RBC: 4.22 MIL/uL (ref 3.87–5.11)
RDW: 15.7 % — ABNORMAL HIGH (ref 11.5–15.5)
WBC: 8.1 10*3/uL (ref 4.0–10.5)
nRBC: 0 % (ref 0.0–0.2)

## 2020-03-31 LAB — SARS CORONAVIRUS 2 (TAT 6-24 HRS): SARS Coronavirus 2: NEGATIVE

## 2020-04-04 ENCOUNTER — Other Ambulatory Visit: Payer: Self-pay

## 2020-04-04 ENCOUNTER — Ambulatory Visit
Admission: RE | Admit: 2020-04-04 | Discharge: 2020-04-04 | Disposition: A | Discharge status: Home / Self Care | Payer: Medicare Other | Attending: Cardiovascular Disease | Admitting: Cardiovascular Disease

## 2020-04-04 ENCOUNTER — Encounter: Payer: Self-pay | Admitting: Cardiovascular Disease

## 2020-04-04 ENCOUNTER — Encounter
Admission: RE | Disposition: A | Discharge status: Home / Self Care | Payer: Self-pay | Source: Home / Self Care | Attending: Cardiovascular Disease

## 2020-04-04 DIAGNOSIS — Z79899 Other long term (current) drug therapy: Secondary | ICD-10-CM | POA: Insufficient documentation

## 2020-04-04 DIAGNOSIS — N186 End stage renal disease: Secondary | ICD-10-CM | POA: Insufficient documentation

## 2020-04-04 DIAGNOSIS — I5020 Unspecified systolic (congestive) heart failure: Secondary | ICD-10-CM | POA: Diagnosis not present

## 2020-04-04 DIAGNOSIS — Z888 Allergy status to other drugs, medicaments and biological substances status: Secondary | ICD-10-CM | POA: Diagnosis not present

## 2020-04-04 DIAGNOSIS — I2510 Atherosclerotic heart disease of native coronary artery without angina pectoris: Secondary | ICD-10-CM | POA: Diagnosis not present

## 2020-04-04 DIAGNOSIS — Z7901 Long term (current) use of anticoagulants: Secondary | ICD-10-CM | POA: Insufficient documentation

## 2020-04-04 DIAGNOSIS — E785 Hyperlipidemia, unspecified: Secondary | ICD-10-CM | POA: Diagnosis not present

## 2020-04-04 DIAGNOSIS — E7800 Pure hypercholesterolemia, unspecified: Secondary | ICD-10-CM | POA: Insufficient documentation

## 2020-04-04 DIAGNOSIS — I132 Hypertensive heart and chronic kidney disease with heart failure and with stage 5 chronic kidney disease, or end stage renal disease: Secondary | ICD-10-CM | POA: Diagnosis present

## 2020-04-04 DIAGNOSIS — I480 Paroxysmal atrial fibrillation: Secondary | ICD-10-CM | POA: Diagnosis not present

## 2020-04-04 HISTORY — PX: RIGHT HEART CATH: CATH118263

## 2020-04-04 HISTORY — PX: CORONARY ANGIOGRAPHY: CATH118303

## 2020-04-04 SURGERY — RIGHT HEART CATH
Anesthesia: Moderate Sedation

## 2020-04-04 MED ORDER — HEPARIN SODIUM (PORCINE) 1000 UNIT/ML IJ SOLN
INTRAMUSCULAR | Status: AC
  Filled 2020-04-04: qty 1

## 2020-04-04 MED ORDER — HEPARIN (PORCINE) IN NACL 1000-0.9 UT/500ML-% IV SOLN
INTRAVENOUS | Status: AC
  Filled 2020-04-04: qty 1000

## 2020-04-04 MED ORDER — MIDAZOLAM HCL 2 MG/2ML IJ SOLN
INTRAMUSCULAR | Status: AC
  Filled 2020-04-04: qty 2

## 2020-04-04 MED ORDER — IOHEXOL 300 MG/ML  SOLN
INTRAMUSCULAR | Status: DC | PRN
  Administered 2020-04-04: 36 mL

## 2020-04-04 MED ORDER — SODIUM CHLORIDE 0.9% FLUSH: 3.0000 mL | INTRAVENOUS | Status: DC | PRN

## 2020-04-04 MED ORDER — SODIUM CHLORIDE 0.9 % IV SOLN: INTRAVENOUS | Status: DC

## 2020-04-04 MED ORDER — HEPARIN (PORCINE) IN NACL 2000-0.9 UNIT/L-% IV SOLN
INTRAVENOUS | Status: DC | PRN
  Administered 2020-04-04: 1000 mL

## 2020-04-04 MED ORDER — VERAPAMIL HCL 2.5 MG/ML IV SOLN
INTRAVENOUS | Status: DC | PRN
  Administered 2020-04-04: 2.5 mg via INTRA_ARTERIAL

## 2020-04-04 MED ORDER — MIDAZOLAM HCL 2 MG/2ML IJ SOLN
INTRAMUSCULAR | Status: DC | PRN
  Administered 2020-04-04: 1 mg via INTRAVENOUS

## 2020-04-04 MED ORDER — SODIUM CHLORIDE 0.9 % IV SOLN: 250.0000 mL | INTRAVENOUS | Status: DC | PRN

## 2020-04-04 MED ORDER — ASPIRIN 81 MG PO CHEW
81.0000 mg | CHEWABLE_TABLET | Freq: Once | ORAL | Status: AC
  Administered 2020-04-04: 81 mg via ORAL

## 2020-04-04 MED ORDER — VERAPAMIL HCL 2.5 MG/ML IV SOLN
INTRAVENOUS | Status: AC
  Filled 2020-04-04: qty 2

## 2020-04-04 MED ORDER — SODIUM CHLORIDE 0.9% FLUSH: 3.0000 mL | Freq: Two times a day (BID) | INTRAVENOUS | Status: DC

## 2020-04-04 MED ORDER — FENTANYL CITRATE (PF) 100 MCG/2ML IJ SOLN
INTRAMUSCULAR | Status: AC
  Filled 2020-04-04: qty 2

## 2020-04-04 MED ORDER — FENTANYL CITRATE (PF) 100 MCG/2ML IJ SOLN
INTRAMUSCULAR | Status: DC | PRN
  Administered 2020-04-04: 25 ug via INTRAVENOUS

## 2020-04-04 MED ORDER — HEPARIN SODIUM (PORCINE) 1000 UNIT/ML IJ SOLN
INTRAMUSCULAR | Status: DC | PRN
  Administered 2020-04-04: 3000 [IU] via INTRAVENOUS

## 2020-04-04 MED ORDER — ASPIRIN 81 MG PO CHEW
CHEWABLE_TABLET | ORAL | Status: AC
  Filled 2020-04-04: qty 1

## 2020-04-04 MED ORDER — SODIUM CHLORIDE 0.9 % IV SOLN
INTRAVENOUS | Status: AC | PRN
  Administered 2020-04-04: 250 mL/h via INTRAVENOUS

## 2020-04-04 SURGICAL SUPPLY — 16 items
CATH BALLN WEDGE 5F 110CM (CATHETERS) ×2 IMPLANT
CATH INFINITI 5FR JK (CATHETERS) ×2 IMPLANT
CATH INFINITI JR4 5F (CATHETERS) ×2 IMPLANT
DEVICE RAD TR BAND REGULAR (VASCULAR PRODUCTS) ×2 IMPLANT
DRAPE BRACHIAL (DRAPES) ×4 IMPLANT
GLIDESHEATH SLEND SS 6F .021 (SHEATH) ×2 IMPLANT
GUIDEWIRE .025 260CM (WIRE) ×2 IMPLANT
GUIDEWIRE INQWIRE 1.5J.035X260 (WIRE) ×1 IMPLANT
INQWIRE 1.5J .035X260CM (WIRE) ×2
KIT RIGHT HEART (MISCELLANEOUS) ×2 IMPLANT
KIT SYRINGE INJ CVI SPIKEX1 (MISCELLANEOUS) ×2 IMPLANT
PACK CARDIAC CATH (CUSTOM PROCEDURE TRAY) ×2 IMPLANT
PROTECTION STATION PRESSURIZED (MISCELLANEOUS) ×2
SET ATX SIMPLICITY (MISCELLANEOUS) ×2 IMPLANT
SHEATH GLIDE SLENDER 4/5FR (SHEATH) ×2 IMPLANT
STATION PROTECTION PRESSURIZED (MISCELLANEOUS) ×1 IMPLANT

## 2020-04-04 NOTE — H&P (Signed)
Cardiology Office Note:    Date:  03/01/2020   ID:  Teresa Cook, DOB 01/25/1955, MRN [id number]  PCP:  Murlean Iba, MD              [place]  Cardiologist:  No primary care provider on file.  Advanced Practice Provider:  No care team member to display Electrophysiologist:  None       Referring MD: Murlean Iba, MD       Chief Complaint  Patient presents with  . New Patient (Initial Visit)    Referred by PCP for SOB and CHF. Meds reviewed verbally with patient.     History of Present Illness:    Teresa Cook is a 65 y.o. female with a hx of hypertension, ESRD on HD MWF, paroxysmal atrial fibrillation on Coumadin, presenting due to shortness of breath and concerns of heart failure.  Patient previously seen by [facility] cardiology, patient will like to switch care to [facility].  She presented to the hospital 01/21/2018 2012 with dyspnea on exertion.  Work-up with chest x-ray showed interstitial edema.  Diagnosed with volume overload, she was managed with BiPAP and dialysis for volume control.  Echo 01/23/2020 showed a moderately reduced ejection fraction of 40%.  Aortic valve calcification noted.  No significant stenosis.  Previous outside echocardiogram 09/02/2018 showed a documented EF of 55%.  Outside myocardial perfusion study on 09/02/2019 with no evidence of ischemia.  Since her discharge from the hospital, she has felt well.  She follows up with nephrology office for INR checks.      Past Medical History:  Diagnosis Date  . Anemia   . Anxiety   . Breast cancer ([place]) 01/2016   bilateral  . CHF (congestive heart failure) ([place])   . Chronic kidney disease   . Depression   . Dialysis patient ([place])   . DVT (deep venous thrombosis) ([facility])    left leg  . DVT (deep venous thrombosis) ([place]) 1985   right thigh  . Dysrhythmia   . Gout   . Headache   . HTN (hypertension)   . Hypertension   . Parathyroid abnormality ([place])    . Parathyroid disease ([place])   . Pneumonia 12/2015  . Psoriasis   . Renal insufficiency   . Sickle cell trait ([place])    traits         Past Surgical History:  Procedure Laterality Date  . ABDOMINAL HYSTERECTOMY  1980  . APPENDECTOMY    . BREAST BIOPSY Left 10/28/2013   benign  . BREAST EXCISIONAL BIOPSY Left 2002   benign  . INSERTION OF DIALYSIS CATHETER  2014  . LIPOMA EXCISION N/A 01/23/2016   Procedure: EXCISION LIPOMA;  Surgeon: Hubbard Robinson, MD;  Location: [facility] ORS;  Service: General;  Laterality: N/A;  . MASTECTOMY W/ SENTINEL NODE BIOPSY Bilateral 01/23/2016   Procedure: bilateral MASTECTOMY WITH  bilateral SENTINEL LYMPH NODE BIOPSY possible left axillary node dissection forehead lipoma removal;  Surgeon: Hubbard Robinson, MD;  Location: [facility] ORS;  Service: General;  Laterality: Bilateral;  . PARTIAL HYSTERECTOMY    . PERIPHERAL VASCULAR CATHETERIZATION N/A 12/25/2015   Procedure: Dialysis/Perma Catheter Insertion;  Surgeon: Algernon Huxley, MD;  Location: [place] CV LAB;  Service: Cardiovascular;  Laterality: N/A;  . PERIPHERAL VASCULAR CATHETERIZATION Left 01/22/2016   Procedure: Dialysis/Perma Catheter Insertion;  Surgeon: Algernon Huxley, MD;  Location: [place] CV LAB;  Service: Cardiovascular;  Laterality: Left;  . PERIPHERAL VASCULAR CATHETERIZATION N/A 01/26/2016  Procedure: Dialysis/Perma Catheter Insertion;  Surgeon: Katha Cabal, MD;  Location: [place] CV LAB;  Service: Cardiovascular;  Laterality: N/A;  . PORT-A-CATH REMOVAL N/A 12/20/2015   Procedure: REMOVAL PORT-A-CATH;  Surgeon: Hubbard Robinson, MD;  Location: [facility] ORS;  Service: General;  Laterality: N/A;  left    . PORTACATH PLACEMENT Left 08/21/2015   Procedure: INSERTION PORT-A-CATH;  Surgeon: Hubbard Robinson, MD;  Location: [facility] ORS;  Service: General;  Laterality: Left;  . REMOVAL OF A DIALYSIS CATHETER  2017    Current Medications: Active  Medications      Current Meds  Medication Sig  . aspirin-acetaminophen-caffeine (EXCEDRIN MIGRAINE) 250-250-65 MG tablet Take 1-2 tablets by mouth every 6 (six) hours as needed for headache.  . b complex-vitamin c-folic acid (NEPHRO-VITE) 0.8 MG TABS tablet Take 1 tablet by mouth daily.  . cinacalcet (SENSIPAR) 60 MG tablet Take 60 mg by mouth daily before lunch.  . metoprolol succinate (TOPROL-XL) 50 MG 24 hr tablet Take 1 tablet (50 mg total) by mouth daily. Take with or immediately following a meal.  . pantoprazole (PROTONIX) 40 MG tablet Take 1 tablet (40 mg total) by mouth daily.  . sacubitril-valsartan (ENTRESTO) 24-26 MG Take 1 tablet by mouth 2 (two) times daily.  . sertraline (ZOLOFT) 100 MG tablet Take 1.5 tablets (150 mg total) by mouth daily.  . sevelamer carbonate (RENVELA) 800 MG tablet Take 800-2,400 mg by mouth See admin instructions. Take 3 tablets (2400mg ) by mouth three times daily before meals and take 1 tablet (800mg ) by mouth daily before a snack  . traZODone (DESYREL) 50 MG tablet Take 1 tablet (50 mg total) by mouth at bedtime. May increase to 2 tablets if needed.  . warfarin (COUMADIN) 5 MG tablet Take 1-1.5 tablets (5-7.5 mg total) by mouth See admin instructions. Take 1 tablet (5mg ) by mouth every Monday, Wednesday, Friday, Saturday and Sunday evening and take 1.5  tablets (7.5mg ) by mouth every Tuesday and Thursday evening  . zolpidem (AMBIEN) 10 MG tablet Take 1 tablet (10 mg total) by mouth at bedtime.  . [DISCONTINUED] isosorbide mononitrate (IMDUR) 30 MG 24 hr tablet Take 1 tablet (30 mg total) by mouth daily.       Allergies:   Gabapentin and Adhesive [tape]   Social History        Socioeconomic History  . Marital status: Widowed    Spouse name: Not on file  . Number of children: Not on file  . Years of education: Not on file  . Highest education level: Not on file  Occupational History  . Occupation: disabled  Tobacco Use  . Smoking status:  Never Smoker  . Smokeless tobacco: Never Used  Vaping Use  . Vaping Use: Never used  Substance and Sexual Activity  . Alcohol use: No    Alcohol/week: 0.0 standard drinks  . Drug use: No  . Sexual activity: Never    Birth control/protection: Surgical  Other Topics Concern  . Not on file  Social History Narrative   ** Merged History Encounter **       Social Determinants of Health   Financial Resource Strain: Not on file  Food Insecurity: Not on file  Transportation Needs: Not on file  Physical Activity: Not on file  Stress: Not on file  Social Connections: Not on file     Family History: The patient's family history includes Breast cancer (age of onset: 64) in her maternal aunt; CVA in her mother; Cancer (age of  onset: 48) in her sister; Diabetes in her sister; Hypertension in her brother, father, mother, and sister; Stroke in her brother and mother.  ROS:   Please see the history of present illness.     All other systems reviewed and are negative.  EKGs/Labs/Other Studies Reviewed:    The following studies were reviewed today:   EKG:  EKG is  ordered today.  The ekg ordered today demonstrates normal sinus rhythm  Recent Labs: 01/23/2020: ALT 26 01/25/2020: B Natriuretic Peptide 392.7 02/29/2020: BUN 40; Creatinine, Ser 8.94; Hemoglobin 11.8; Platelets 194; Potassium 4.8; Sodium 140  Recent Lipid Panel Labs (Brief)          Component Value Date/Time   CHOL 247 (H) 02/29/2020 1040   CHOL 250 (H) 02/03/2014 0135   TRIG 156 (H) 02/29/2020 1040   TRIG 144 02/03/2014 0135   HDL 49 02/29/2020 1040   HDL 35 (L) 02/03/2014 0135   CHOLHDL 5.0 (H) 02/29/2020 1040   VLDL 29 02/03/2014 0135   LDLCALC 169 (H) 02/29/2020 1040   LDLCALC 186 (H) 02/03/2014 0135       Risk Assessment/Calculations:      Physical Exam:    VS:  BP 110/70 (BP Location: Right Arm, Patient Position: Sitting, Cuff Size: Normal)   Pulse 80   Ht 5\' 5"  (1.651  m)   Wt 129 lb (58.5 kg)   SpO2 97%   BMI 21.47 kg/m        Wt Readings from Last 3 Encounters:  02/29/20 129 lb (58.5 kg)  01/26/20 131 lb 1.6 oz (59.5 kg)  02/11/17 115 lb 4.8 oz (52.3 kg)     GEN:  Well nourished, well developed in no acute distress HEENT: Normal NECK: No JVD; No carotid bruits LYMPHATICS: No lymphadenopathy CARDIAC: RRR, no murmurs, rubs, gallops RESPIRATORY:  Clear to auscultation without rales, wheezing or rhonchi  ABDOMEN: Soft, non-tender, non-distended MUSCULOSKELETAL:  No edema; No deformity  SKIN: Warm and dry NEUROLOGIC:  Alert and oriented x 3 PSYCHIATRIC:  Normal affect   ASSESSMENT:    1. HFrEF (heart failure with reduced ejection fraction) (HCC)   2. Paroxysmal atrial fibrillation (HCC)   3. Pure hypercholesterolemia    PLAN:    In order of problems listed above:  1. Patient with HFrEF, NYHA class II-III symptoms.  Appears euvolemic.  Echocardiogram reviewed by myself showing moderately reduced EF, 35 to 40%.  We will schedule patient for right and left heart cath.  Continue Toprol-XL 50 mg daily, start Entresto 24-26. Hold imdur for now. 2. Paroxysmal atrial fibrillation, currently in sinus rhythm.  Continue Toprol-XL, warfarin.  Follows up with nephrology office for frequent INR checks. 3. Hyperlipidemia, obtain fasting lipid profile.    Follow-up after left heart catheter   Shared Decision Making/Informed Consent The risks [stroke (1 in 1000), death (1 in 1000), kidney failure [usually temporary] (1 in 500), bleeding (1 in 200), allergic reaction [possibly serious] (1 in 200)], benefits (diagnostic support and management of coronary artery disease) and alternatives of a cardiac catheterization were discussed in detail with Ms. Hinesley and she is willing to proceed.      Medication Adjustments/Labs and Tests Ordered: Current medicines are reviewed at length with the patient today.  Concerns regarding medicines are  outlined above.     Orders Placed This Encounter  Procedures  . CBC  . Lipid panel  . Basic metabolic panel  . EKG 12-Lead       Meds ordered this encounter  Medications  . sacubitril-valsartan (ENTRESTO) 24-26 MG    Sig: Take 1 tablet by mouth 2 (two) times daily.    Dispense:  60 tablet    Refill:  3    Patient Instructions  Medication Instructions:   Your physician has recommended you make the following change in your medication:   1.  STOP taking your IMDUR. 2.  START taking Entresto 24-26 MG: Take 1 tablet by mouth twice a day.  *If you need a refill on your cardiac medications before your next appointment, please call your pharmacy*   Lab Work:  CBC, BMP, LIPID Panel  Drawn today  COVID PRE- TEST:You will need a COVID TEST prior to the procedure: LOCATION: [place] site. DATE/TIME:Fri 03/03/20 (anytime between 8 am and 1 pm)    Addendum on 04/04/2020: The patient is here today for a right and left cardiac catheterization.  There has been no change in her condition.  She continues to have significant exertional dyspnea and occasional substernal chest tightness with exertion. Last warfarin was taken on Thursday. By physical exam, heart is regular with 1 out of 6 systolic murmur in the aortic area.  Lungs are clear to auscultation.  Right radial pulses normal.  Distal pulses are palpable.  No fistula in the right arm and no plans for fistulas in the future according the patient.  I discussed the procedure in details as well as risk and benefits and the patient is agreeable to proceed.

## 2020-04-04 NOTE — Discharge Instructions (Signed)
You may start your Coumadin back tonight. Per Dr. Fletcher Anon, do not take your [facility] before dialysis.   Moderate Conscious Sedation, Adult, Care After This sheet gives you information about how to care for yourself after your procedure. Your health care provider may also give you more specific instructions. If you have problems or questions, contact your health care provider. What can I expect after the procedure? After the procedure, it is common to have:  Sleepiness for several hours.  Impaired judgment for several hours.  Difficulty with balance.  Vomiting if you eat too soon. Follow these instructions at home: For the time period you were told by your health care provider:  Rest.  Do not participate in activities where you could fall or become injured.  Do not drive or use machinery.  Do not drink alcohol.  Do not take sleeping pills or medicines that cause drowsiness.  Do not make important decisions or sign legal documents.  Do not take care of children on your own.      Eating and drinking  Follow the diet recommended by your health care provider.  Drink enough fluid to keep your urine pale yellow.  If you vomit: ? Drink water, juice, or soup when you can drink without vomiting. ? Make sure you have little or no nausea before eating solid foods.   General instructions  Take over-the-counter and prescription medicines only as told by your health care provider.  Have a responsible adult stay with you for the time you are told. It is important to have someone help care for you until you are awake and alert.  Do not smoke.  Keep all follow-up visits as told by your health care provider. This is important. Contact a health care provider if:  You are still sleepy or having trouble with balance after 24 hours.  You feel light-headed.  You keep feeling nauseous or you keep vomiting.  You develop a rash.  You have a fever.  You have redness or swelling around  the IV site. Get help right away if:  You have trouble breathing.  You have new-onset confusion at home. Summary  After the procedure, it is common to feel sleepy, have impaired judgment, or feel nauseous if you eat too soon.  Rest after you get home. Know the things you should not do after the procedure.  Follow the diet recommended by your health care provider and drink enough fluid to keep your urine pale yellow.  Get help right away if you have trouble breathing or new-onset confusion at home. This information is not intended to replace advice given to you by your health care provider. Make sure you discuss any questions you have with your health care provider. Document Revised: 04/23/2019 Document Reviewed: 11/19/2018 Elsevier Patient Education  2021 [place]  This sheet gives you information about how to care for yourself after your procedure. Your health care provider may also give you more specific instructions. If you have problems or questions, contact your health care provider. What can I expect after the procedure? After the procedure, it is common to have:  Bruising and tenderness at the catheter insertion area. Follow these instructions at home: Medicines  Take over-the-counter and prescription medicines only as told by your health care provider. Insertion site care  Follow instructions from your health care provider about how to take care of your insertion site. Make sure you: ? Wash your hands with soap and water before you change  your bandage (dressing). If soap and water are not available, use hand sanitizer. ? Change your dressing as told by your health care provider. ? Leave stitches (sutures), skin glue, or adhesive strips in place. These skin closures may need to stay in place for 2 weeks or longer. If adhesive strip edges start to loosen and curl up, you may trim the loose edges. Do not remove adhesive strips completely unless your  health care provider tells you to do that.  Check your insertion site every day for signs of infection. Check for: ? Redness, swelling, or pain. ? Fluid or blood. ? Pus or a bad smell. ? Warmth.  Do not take baths, swim, or use a hot tub until your health care provider approves.  You may shower 24-48 hours after the procedure, or as directed by your health care provider. ? Remove the dressing and gently wash the site with plain soap and water. ? Pat the area dry with a clean towel. ? Do not rub the site. That could cause bleeding.  Do not apply powder or lotion to the site. Activity  For 24 hours after the procedure, or as directed by your health care provider: ? Do not flex or bend the affected arm. ? Do not push or pull heavy objects with the affected arm. ? Do not drive yourself home from the hospital or clinic. You may drive 24 hours after the procedure unless your health care provider tells you not to. ? Do not operate machinery or power tools.  Do not lift anything that is heavier than 10 lb (4.5 kg), or the limit that you are told, until your health care provider says that it is safe.  Ask your health care provider when it is okay to: ? Return to work or school. ? Resume usual physical activities or sports. ? Resume sexual activity.   General instructions  If the catheter site starts to bleed, raise your arm and put firm pressure on the site. If the bleeding does not stop, get help right away. This is a medical emergency.  If you went home on the same day as your procedure, a responsible adult should be with you for the first 24 hours after you arrive home.  Keep all follow-up visits as told by your health care provider. This is important. Contact a health care provider if:  You have a fever.  You have redness, swelling, or yellow drainage around your insertion site. Get help right away if:  You have unusual pain at the radial site.  The catheter insertion area  swells very fast.  The insertion area is bleeding, and the bleeding does not stop when you hold steady pressure on the area.  Your arm or hand becomes pale, cool, tingly, or numb. These symptoms may represent a serious problem that is an emergency. Do not wait to see if the symptoms will go away. Get medical help right away. Call your local emergency services (911 in the [place] Do not drive yourself to the hospital. Summary  After the procedure, it is common to have bruising and tenderness at the site.  Follow instructions from your health care provider about how to take care of your radial site wound. Check the wound every day for signs of infection.  Do not lift anything that is heavier than 10 lb (4.5 kg), or the limit that you are told, until your health care provider says that it is safe. This information is not intended to replace  advice given to you by your health care provider. Make sure you discuss any questions you have with your health care provider. Document Revised: 01/29/2017 Document Reviewed: 01/29/2017 Elsevier Patient Education  2021 [employer].

## 2020-07-04 ENCOUNTER — Ambulatory Visit: Payer: Medicare Other | Admitting: Neurology

## 2020-10-24 ENCOUNTER — Other Ambulatory Visit: Payer: Self-pay

## 2020-10-24 ENCOUNTER — Encounter: Payer: Self-pay | Admitting: *Deleted

## 2020-10-24 DIAGNOSIS — Z992 Dependence on renal dialysis: Secondary | ICD-10-CM | POA: Insufficient documentation

## 2020-10-24 DIAGNOSIS — Z853 Personal history of malignant neoplasm of breast: Secondary | ICD-10-CM | POA: Insufficient documentation

## 2020-10-24 DIAGNOSIS — R197 Diarrhea, unspecified: Secondary | ICD-10-CM | POA: Diagnosis present

## 2020-10-24 DIAGNOSIS — I5023 Acute on chronic systolic (congestive) heart failure: Secondary | ICD-10-CM | POA: Diagnosis not present

## 2020-10-24 DIAGNOSIS — R778 Other specified abnormalities of plasma proteins: Secondary | ICD-10-CM | POA: Diagnosis not present

## 2020-10-24 DIAGNOSIS — I132 Hypertensive heart and chronic kidney disease with heart failure and with stage 5 chronic kidney disease, or end stage renal disease: Secondary | ICD-10-CM | POA: Insufficient documentation

## 2020-10-24 DIAGNOSIS — R112 Nausea with vomiting, unspecified: Secondary | ICD-10-CM | POA: Diagnosis present

## 2020-10-24 DIAGNOSIS — N2889 Other specified disorders of kidney and ureter: Secondary | ICD-10-CM | POA: Diagnosis not present

## 2020-10-24 DIAGNOSIS — Z7901 Long term (current) use of anticoagulants: Secondary | ICD-10-CM | POA: Insufficient documentation

## 2020-10-24 DIAGNOSIS — M6281 Muscle weakness (generalized): Secondary | ICD-10-CM | POA: Insufficient documentation

## 2020-10-24 DIAGNOSIS — N186 End stage renal disease: Secondary | ICD-10-CM | POA: Insufficient documentation

## 2020-10-24 DIAGNOSIS — U071 COVID-19: Secondary | ICD-10-CM | POA: Diagnosis not present

## 2020-10-24 DIAGNOSIS — Z79899 Other long term (current) drug therapy: Secondary | ICD-10-CM | POA: Diagnosis not present

## 2020-10-24 LAB — COMPREHENSIVE METABOLIC PANEL
ALT: 20 U/L (ref 0–44)
AST: 25 U/L (ref 15–41)
Albumin: 3.7 g/dL (ref 3.5–5.0)
Alkaline Phosphatase: 72 U/L (ref 38–126)
Anion gap: 14 (ref 5–15)
BUN: 42 mg/dL — ABNORMAL HIGH (ref 8–23)
CO2: 25 mmol/L (ref 22–32)
Calcium: 8.9 mg/dL (ref 8.9–10.3)
Chloride: 98 mmol/L (ref 98–111)
Creatinine, Ser: 9.78 mg/dL — ABNORMAL HIGH (ref 0.44–1.00)
GFR, Estimated: 4 mL/min — ABNORMAL LOW (ref >60)
Glucose, Bld: 93 mg/dL (ref 70–99)
Potassium: 4.2 mmol/L (ref 3.5–5.1)
Sodium: 137 mmol/L (ref 135–145)
Total Bilirubin: 0.9 mg/dL (ref 0.3–1.2)
Total Protein: 7.1 g/dL (ref 6.5–8.1)

## 2020-10-24 LAB — CBC
HCT: 36 % (ref 36.0–46.0)
Hemoglobin: 12.4 g/dL (ref 12.0–15.0)
MCH: 30.4 pg (ref 26.0–34.0)
MCHC: 34.4 g/dL (ref 30.0–36.0)
MCV: 88.2 fL (ref 80.0–100.0)
Platelets: 131 10*3/uL — ABNORMAL LOW (ref 150–400)
RBC: 4.08 MIL/uL (ref 3.87–5.11)
RDW: 15.1 % (ref 11.5–15.5)
WBC: 3.5 10*3/uL — ABNORMAL LOW (ref 4.0–10.5)
nRBC: 0 % (ref 0.0–0.2)

## 2020-10-24 LAB — LIPASE, BLOOD: Lipase: 44 U/L (ref 11–51)

## 2020-10-24 LAB — TROPONIN I (HIGH SENSITIVITY): Troponin I (High Sensitivity): 21 ng/L — ABNORMAL HIGH (ref <18)

## 2020-10-24 NOTE — ED Provider Notes (Signed)
Emergency Medicine Provider Triage Evaluation Note  Teresa Cook, a 65 y.o. female  was evaluated in triage.  Pt complains of vomiting and diarrhea.  Patient denies any frank abdominal pain at this time patient is reporting generalized weakness for the last week.  She was able to take dialysis yesterday without difficulty, but has endorsed postprandial diarrhea after every meal.  She denies any fevers, chills, sweats, or chest pain.  Review of Systems  Positive: N/V, weakness Negative: FCS  Physical Exam  BP (!) 162/93 (BP Location: Right Arm)   Pulse 80   Temp 98.7 F (37.1 C) (Oral)   Resp 18   Ht 5\' 5"  (1.651 m)   Wt 54.4 kg   SpO2 97%   BMI 19.97 kg/m  Gen:   Awake, no distress  NAD Resp:  Normal effort CTA MSK:   Moves extremities without difficulty  Other:  ABD: soft, hyperactive bowel sounds  Medical Decision Making  Medically screening exam initiated at 5:58 PM.  Appropriate orders placed.  Teresa Cook was informed that the remainder of the evaluation will be completed by another provider, this initial triage assessment does not replace that evaluation, and the importance of remaining in the ED until their evaluation is complete.  Geriatric patient with ESRD, presented to ED with 1 week complaint of generalized weakness as well as vomiting and diarrhea.   Melvenia Needles, PA-C 10/24/20 1832    Harvest Dark, MD 10/24/20 2311

## 2020-10-24 NOTE — ED Triage Notes (Addendum)
Pt brought in via ems from home with vomiting and diarrhea.  Pt denies abd pain.  Pt reports feeling weak.  Sx for 1 week.   Pt had dialysis yesterday.  Pt alert  speech clear.

## 2020-10-25 ENCOUNTER — Emergency Department: Payer: Medicare Other

## 2020-10-25 ENCOUNTER — Observation Stay
Admission: EM | Admit: 2020-10-25 | Discharge: 2020-10-26 | Disposition: A | Discharge status: Home / Self Care | Payer: Medicare Other | Attending: Emergency Medicine | Admitting: Emergency Medicine

## 2020-10-25 DIAGNOSIS — R112 Nausea with vomiting, unspecified: Secondary | ICD-10-CM | POA: Diagnosis present

## 2020-10-25 DIAGNOSIS — R531 Weakness: Secondary | ICD-10-CM

## 2020-10-25 DIAGNOSIS — R197 Diarrhea, unspecified: Secondary | ICD-10-CM

## 2020-10-25 DIAGNOSIS — U071 COVID-19: Secondary | ICD-10-CM | POA: Diagnosis not present

## 2020-10-25 DIAGNOSIS — N186 End stage renal disease: Secondary | ICD-10-CM

## 2020-10-25 DIAGNOSIS — F32A Depression, unspecified: Secondary | ICD-10-CM | POA: Diagnosis present

## 2020-10-25 DIAGNOSIS — I10 Essential (primary) hypertension: Secondary | ICD-10-CM | POA: Diagnosis present

## 2020-10-25 DIAGNOSIS — Z992 Dependence on renal dialysis: Secondary | ICD-10-CM

## 2020-10-25 DIAGNOSIS — I82409 Acute embolism and thrombosis of unspecified deep veins of unspecified lower extremity: Secondary | ICD-10-CM | POA: Diagnosis present

## 2020-10-25 DIAGNOSIS — I5022 Chronic systolic (congestive) heart failure: Secondary | ICD-10-CM | POA: Diagnosis present

## 2020-10-25 DIAGNOSIS — R778 Other specified abnormalities of plasma proteins: Secondary | ICD-10-CM | POA: Diagnosis not present

## 2020-10-25 DIAGNOSIS — E43 Unspecified severe protein-calorie malnutrition: Secondary | ICD-10-CM | POA: Diagnosis present

## 2020-10-25 DIAGNOSIS — R7989 Other specified abnormal findings of blood chemistry: Secondary | ICD-10-CM | POA: Diagnosis present

## 2020-10-25 LAB — RESP PANEL BY RT-PCR (FLU A&B, COVID) ARPGX2
Influenza A by PCR: NEGATIVE
Influenza B by PCR: NEGATIVE
SARS Coronavirus 2 by RT PCR: POSITIVE — AB

## 2020-10-25 LAB — C DIFFICILE QUICK SCREEN W PCR REFLEX
C Diff antigen: NEGATIVE
C Diff interpretation: NOT DETECTED
C Diff toxin: NEGATIVE

## 2020-10-25 LAB — C-REACTIVE PROTEIN: CRP: <0.5 mg/dL (ref <1.0)

## 2020-10-25 LAB — GASTROINTESTINAL PANEL BY PCR, STOOL (REPLACES STOOL CULTURE)

## 2020-10-25 LAB — PROTIME-INR
INR: 3.2 — ABNORMAL HIGH (ref 0.8–1.2)
Prothrombin Time: 32.9 seconds — ABNORMAL HIGH (ref 11.4–15.2)

## 2020-10-25 LAB — FERRITIN: Ferritin: 1401 ng/mL — ABNORMAL HIGH (ref 11–307)

## 2020-10-25 LAB — TROPONIN I (HIGH SENSITIVITY): Troponin I (High Sensitivity): 27 ng/L — ABNORMAL HIGH (ref <18)

## 2020-10-25 LAB — FIBRINOGEN: Fibrinogen: 450 mg/dL (ref 210–475)

## 2020-10-25 LAB — PROCALCITONIN: Procalcitonin: 0.28 ng/mL

## 2020-10-25 LAB — BRAIN NATRIURETIC PEPTIDE: B Natriuretic Peptide: 452.3 pg/mL — ABNORMAL HIGH (ref 0.0–100.0)

## 2020-10-25 LAB — HEPATITIS B SURFACE ANTIBODY,QUALITATIVE: Hep B S Ab: REACTIVE — AB

## 2020-10-25 LAB — HEPATITIS B SURFACE ANTIGEN: Hepatitis B Surface Ag: NONREACTIVE

## 2020-10-25 LAB — D-DIMER, QUANTITATIVE: D-Dimer, Quant: 0.58 ug/mL-FEU — ABNORMAL HIGH (ref 0.00–0.50)

## 2020-10-25 LAB — LACTATE DEHYDROGENASE: LDH: 139 U/L (ref 98–192)

## 2020-10-25 MED ORDER — PREDNISONE 20 MG PO TABS: 50.0000 mg | ORAL_TABLET | Freq: Every day | ORAL | Status: DC

## 2020-10-25 MED ORDER — ONDANSETRON HCL 4 MG/2ML IJ SOLN
4.0000 mg | Freq: Once | INTRAMUSCULAR | Status: AC
  Administered 2020-10-25: 4 mg via INTRAVENOUS
  Filled 2020-10-25: qty 2

## 2020-10-25 MED ORDER — SODIUM CHLORIDE 0.9 % IV SOLN
100.0000 mg | Freq: Every day | INTRAVENOUS | Status: DC
  Administered 2020-10-26: 100 mg via INTRAVENOUS
  Filled 2020-10-25: qty 100

## 2020-10-25 MED ORDER — SEVELAMER CARBONATE 800 MG PO TABS
2400.0000 mg | ORAL_TABLET | Freq: Three times a day (TID) | ORAL | Status: DC
  Administered 2020-10-26 (×2): 2400 mg via ORAL
  Filled 2020-10-25 (×5): qty 3

## 2020-10-25 MED ORDER — HEPARIN SODIUM (PORCINE) 1000 UNIT/ML IJ SOLN
INTRAMUSCULAR | Status: AC
  Filled 2020-10-25: qty 1

## 2020-10-25 MED ORDER — SEVELAMER CARBONATE 800 MG PO TABS: 800.0000 mg | ORAL_TABLET | ORAL | Status: DC

## 2020-10-25 MED ORDER — SEVELAMER CARBONATE 800 MG PO TABS
800.0000 mg | ORAL_TABLET | ORAL | Status: DC | PRN
  Filled 2020-10-25: qty 1

## 2020-10-25 MED ORDER — ASPIRIN-ACETAMINOPHEN-CAFFEINE 250-250-65 MG PO TABS
1.0000 | ORAL_TABLET | Freq: Four times a day (QID) | ORAL | Status: DC | PRN
  Filled 2020-10-25: qty 2

## 2020-10-25 MED ORDER — METOPROLOL SUCCINATE ER 50 MG PO TB24
50.0000 mg | ORAL_TABLET | Freq: Every day | ORAL | Status: DC
  Administered 2020-10-26: 50 mg via ORAL
  Filled 2020-10-25 (×2): qty 1

## 2020-10-25 MED ORDER — CINACALCET HCL 30 MG PO TABS
60.0000 mg | ORAL_TABLET | Freq: Every day | ORAL | Status: DC
  Administered 2020-10-26: 60 mg via ORAL
  Filled 2020-10-25 (×2): qty 2

## 2020-10-25 MED ORDER — CHLORHEXIDINE GLUCONATE CLOTH 2 % EX PADS
6.0000 | MEDICATED_PAD | Freq: Every day | CUTANEOUS | Status: DC
  Administered 2020-10-26: 6 via TOPICAL
  Filled 2020-10-25 (×2): qty 6

## 2020-10-25 MED ORDER — DM-GUAIFENESIN ER 30-600 MG PO TB12
1.0000 | ORAL_TABLET | Freq: Two times a day (BID) | ORAL | Status: DC | PRN
  Administered 2020-10-26: 1 via ORAL
  Filled 2020-10-25: qty 1

## 2020-10-25 MED ORDER — SACUBITRIL-VALSARTAN 24-26 MG PO TABS
1.0000 | ORAL_TABLET | Freq: Two times a day (BID) | ORAL | Status: DC
  Administered 2020-10-25 – 2020-10-26 (×2): 1 via ORAL
  Filled 2020-10-25 (×4): qty 1

## 2020-10-25 MED ORDER — WARFARIN SODIUM 3 MG PO TABS
3.0000 mg | ORAL_TABLET | Freq: Once | ORAL | Status: AC
  Administered 2020-10-25: 3 mg via ORAL
  Filled 2020-10-25: qty 1

## 2020-10-25 MED ORDER — ZOLPIDEM TARTRATE 5 MG PO TABS
5.0000 mg | ORAL_TABLET | Freq: Every day | ORAL | Status: DC
  Administered 2020-10-25: 5 mg via ORAL
  Filled 2020-10-25: qty 1

## 2020-10-25 MED ORDER — ACETAMINOPHEN 325 MG PO TABS
650.0000 mg | ORAL_TABLET | Freq: Four times a day (QID) | ORAL | Status: DC | PRN
  Administered 2020-10-26: 650 mg via ORAL
  Filled 2020-10-25: qty 2

## 2020-10-25 MED ORDER — ALBUTEROL SULFATE HFA 108 (90 BASE) MCG/ACT IN AERS
2.0000 | INHALATION_SPRAY | RESPIRATORY_TRACT | Status: DC | PRN
  Filled 2020-10-25: qty 6.7

## 2020-10-25 MED ORDER — ENSURE ENLIVE PO LIQD
237.0000 mL | Freq: Two times a day (BID) | ORAL | Status: DC
  Administered 2020-10-25 – 2020-10-26 (×3): 237 mL via ORAL

## 2020-10-25 MED ORDER — METHYLPREDNISOLONE SODIUM SUCC 125 MG IJ SOLR
1.0000 mg/kg | Freq: Two times a day (BID) | INTRAMUSCULAR | Status: DC
Start: 2020-10-25 — End: 2020-10-25
  Administered 2020-10-25: 54.375 mg via INTRAVENOUS
  Filled 2020-10-25: qty 2

## 2020-10-25 MED ORDER — LOPERAMIDE HCL 2 MG PO CAPS
2.0000 mg | ORAL_CAPSULE | Freq: Two times a day (BID) | ORAL | Status: DC | PRN
Start: 2020-10-25 — End: 2020-10-26

## 2020-10-25 MED ORDER — WARFARIN - PHARMACIST DOSING INPATIENT
Freq: Every day | Status: DC
  Filled 2020-10-25: qty 1

## 2020-10-25 MED ORDER — SODIUM CHLORIDE 0.9 % IV BOLUS
500.0000 mL | Freq: Once | INTRAVENOUS | Status: AC
  Administered 2020-10-25: 500 mL via INTRAVENOUS

## 2020-10-25 MED ORDER — SERTRALINE HCL 50 MG PO TABS
150.0000 mg | ORAL_TABLET | Freq: Every day | ORAL | Status: DC
  Administered 2020-10-26: 150 mg via ORAL
  Filled 2020-10-25 (×2): qty 3

## 2020-10-25 MED ORDER — HYDRALAZINE HCL 20 MG/ML IJ SOLN: 5.0000 mg | INTRAMUSCULAR | Status: DC | PRN

## 2020-10-25 MED ORDER — PANTOPRAZOLE SODIUM 40 MG PO TBEC
40.0000 mg | DELAYED_RELEASE_TABLET | Freq: Every day | ORAL | Status: DC
  Administered 2020-10-26: 40 mg via ORAL
  Filled 2020-10-25 (×2): qty 1

## 2020-10-25 MED ORDER — RENA-VITE PO TABS
1.0000 | ORAL_TABLET | Freq: Every day | ORAL | Status: DC
  Administered 2020-10-25: 1 via ORAL
  Filled 2020-10-25 (×2): qty 1

## 2020-10-25 MED ORDER — SODIUM CHLORIDE 0.9 % IV SOLN
200.0000 mg | Freq: Once | INTRAVENOUS | Status: AC
  Administered 2020-10-25: 200 mg via INTRAVENOUS
  Filled 2020-10-25: qty 40

## 2020-10-25 MED ORDER — ONDANSETRON HCL 4 MG/2ML IJ SOLN
4.0000 mg | Freq: Three times a day (TID) | INTRAMUSCULAR | Status: DC | PRN
  Administered 2020-10-26: 4 mg via INTRAVENOUS
  Filled 2020-10-25: qty 2

## 2020-10-25 MED ORDER — CLONIDINE HCL 0.1 MG PO TABS
0.1000 mg | ORAL_TABLET | Freq: Once | ORAL | Status: AC
  Administered 2020-10-25: 0.1 mg via ORAL
  Filled 2020-10-25: qty 1

## 2020-10-25 NOTE — Plan of Care (Signed)
Hemodialysis patient positive for COVID.

## 2020-10-25 NOTE — ED Provider Notes (Signed)
[facility] Emergency Department Provider Note   ____________________________________________   Event Date/Time   First MD Initiated Contact with Patient 10/25/20 0211     (approximate)  I have reviewed the triage vital signs and the nursing notes.   HISTORY  Chief Complaint Emesis    HPI Kitzia Camus is a 65 y.o. female brought to the ED via EMS from home with a chief complaint of nausea, vomiting, diarrhea and generalized weakness.  Symptoms x1 week after she finished a Z-Pak for sore throat, cough and congestion.  Her doctor seem to think symptoms were from the antibiotic.  Patient has a history of ESRD on HD M/W/F; she went for dialysis yesterday but she was dry and they did not pull any fluid off of her.  Denies chest pain, shortness of breath, abdominal pain.  Does not make urine.      Past Medical History:  Diagnosis Date   Anemia    Anxiety    Breast cancer ([place]) 01/2016   bilateral   CHF (congestive heart failure) ([place])    Chronic kidney disease    Depression    Dialysis patient [facility])    DVT (deep venous thrombosis) ([place])    left leg   DVT (deep venous thrombosis) ([place]) 1985   right thigh   Dysrhythmia    Gout    Headache    HTN (hypertension)    Hypertension    Parathyroid abnormality (HCC)    Parathyroid disease ([place])    Pneumonia 12/2015   Psoriasis    Renal insufficiency    Sickle cell trait ([place])    traits    Patient Active Problem List   Diagnosis Date Noted   HFrEF (heart failure with reduced ejection fraction) ([place])    Acute CHF ([place]) 01/23/2020   CHF (congestive heart failure) ([place]) 01/13/2017   Protein-calorie malnutrition, severe 01/13/2017   History of seizure 11/19/2016   NICM (nonischemic cardiomyopathy) ([place]) 09/12/2016   Elevated troponin 09/12/2016   PNA (pneumonia) 09/12/2016   Accelerated hypertension 09/12/2016   Pressure injury of skin 09/11/2016   Headache disorder 05/14/2016   Spell of  altered cognition 05/14/2016   HTN (hypertension) 02/29/2016   Moderate recurrent major depression ([place]) 02/29/2016   Panic attacks 02/29/2016   Scalp lesion    Breast cancer ([place]) 01/23/2016   ESRD (end stage renal disease) ([place]) 01/22/2016   Severe anemia 01/01/2016   Seizure ([place]) 12/31/2015   Healthcare-associated pneumonia    Bloodstream infection due to Port-A-Cath, initial encounter    Sepsis ([place]) 12/15/2015   Chest pain 08/31/2015   SIRS (systemic inflammatory response syndrome) ([place]) 08/27/2015   Malignant neoplasm of upper-inner quadrant of left breast in female, estrogen receptor positive ([place]) 08/05/2015   Pulmonary edema 07/09/2015   Dizziness 03/03/2015   Seizures ([place]) 03/03/2015   Spells 11/11/2014   Acute respiratory failure with hypoxia ([place]) 09/19/2014   Left ventricular dysfunction 09/06/2014   Acute pulmonary edema ([place]) 08/30/2014   Respiratory failure ([place]) 08/29/2014   Respiratory difficulty 08/29/2014   Acute on chronic systolic heart failure ([place]) 05/16/2014   Hypertension 05/16/2014   Renal failure 05/16/2014   Anxiety 05/16/2014   Sickle cell trait ([place]) 03/03/2014   ESRD on dialysis ([place]) 11/02/2013   Dysgeusia 01/27/2013   Weight loss 01/27/2013   Resting tremor 08/13/2012   Depression 04/30/2012   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   Right leg DVT ([place]) 04/30/2012    Past Surgical History:  Procedure Laterality  Date   ABDOMINAL HYSTERECTOMY  1980   APPENDECTOMY     BREAST BIOPSY Left 10/28/2013   benign   BREAST EXCISIONAL BIOPSY Left 2002   benign   CORONARY ANGIOGRAPHY N/A 04/04/2020   Procedure: CORONARY ANGIOGRAPHY;  Surgeon: Wellington Hampshire, MD;  Location: [place] [place] [facility];  Service: Cardiovascular;  Laterality: N/A;   INSERTION OF DIALYSIS CATHETER  2014   LIPOMA EXCISION N/A 01/23/2016   Procedure: EXCISION LIPOMA;  Surgeon: Hubbard Robinson, MD;  Location: [facility] ORS;  Service: General;  Laterality: N/A;    MASTECTOMY W/ SENTINEL NODE BIOPSY Bilateral 01/23/2016   Procedure: bilateral MASTECTOMY WITH  bilateral SENTINEL LYMPH NODE BIOPSY possible left axillary node dissection forehead lipoma removal;  Surgeon: Hubbard Robinson, MD;  Location: [facility] ORS;  Service: General;  Laterality: Bilateral;   PARTIAL HYSTERECTOMY     PERIPHERAL VASCULAR CATHETERIZATION N/A 12/25/2015   Procedure: Dialysis/Perma Catheter Insertion;  Surgeon: Algernon Huxley, MD;  Location: [place] [facility];  Service: Cardiovascular;  Laterality: N/A;   PERIPHERAL VASCULAR CATHETERIZATION Left 01/22/2016   Procedure: Dialysis/Perma Catheter Insertion;  Surgeon: Algernon Huxley, MD;  Location: [place] [facility];  Service: Cardiovascular;  Laterality: Left;   PERIPHERAL VASCULAR CATHETERIZATION N/A 01/26/2016   Procedure: Dialysis/Perma Catheter Insertion;  Surgeon: Katha Cabal, MD;  Location: [place] [facility];  Service: Cardiovascular;  Laterality: N/A;   PORT-A-CATH REMOVAL N/A 12/20/2015   Procedure: REMOVAL PORT-A-CATH;  Surgeon: Hubbard Robinson, MD;  Location: [facility] OR[facility];  Service: General;  Laterality: N/A;  left     PORTACATH PLACEMENT Left 08/21/2015   Procedure: INSERTION PORT-A-CATH;  Surgeon: Hubbard Robinson, MD;  Location: [facility] OR[facility];  Service: General;  Laterality: Left;   REMOVAL OF A DIALYSIS CATHETER  2017   RIGHT HEART CATH N/A 04/04/2020   Procedure: RIGHT HEART CATH;  Surgeon: Wellington Hampshire, MD;  Location: [place] [facility];  Service: Cardiovascular;  Laterality: N/A;    Prior to Admission medications   Medication Sig Start Date End Date Taking? Authorizing Provider  aspirin-acetaminophen-caffeine (EXCEDRIN MIGRAINE) [id number] MG tablet Take 1-2 tablets by mouth every 6 (six) hours as needed for headache.    [provider]  b complex-vitamin c-folic acid (NEPHRO-VITE) 0.8 MG TABS tablet Take 1 tablet by mouth daily.    [provider]  cinacalcet (SENSIPAR) 60 MG tablet  Take 60 mg by mouth daily before lunch.    [provider]  metoprolol succinate (TOPROL-XL) 50 MG 24 hr tablet Take 1 tablet (50 mg total) by mouth daily. Take with or immediately following a meal. 01/26/20   Danford, Suann Larry, MD  pantoprazole (PROTONIX) 40 MG tablet Take 1 tablet (40 mg total) by mouth daily. 01/26/20   Danford, Suann Larry, MD  sacubitril-valsartan (ENTRESTO) 24-26 MG Take 1 tablet by mouth 2 (two) times daily. 02/29/20   Kate Sable, MD  sertraline (ZOLOFT) 100 MG tablet Take 1.5 tablets (150 mg total) by mouth daily. 01/26/20   Danford, Suann Larry, MD  sevelamer carbonate (RENVELA) 800 MG tablet Take 800-2,400 mg by mouth See admin instructions. Take 3 tablets (2400mg ) by mouth three times daily before meals and take 1 tablet (800mg ) by mouth daily before a snack    [provider]  traZODone (DESYREL) 50 MG tablet Take 1 tablet (50 mg total) by mouth at bedtime. May increase to 2 tablets if needed. 01/26/20   Danford, Suann Larry, MD  warfarin (COUMADIN) 5 MG tablet  Take 1-1.5 tablets (5-7.5 mg total) by mouth See admin instructions. Take 1 tablet (5mg ) by mouth every Monday, Wednesday, Friday, Saturday and Sunday evening and take 1.5  tablets (7.5mg ) by mouth every Tuesday and Thursday evening 01/26/20   Danford, Suann Larry, MD  zolpidem (AMBIEN) 10 MG tablet Take 1 tablet (10 mg total) by mouth at bedtime. 01/26/20   Danford, Suann Larry, MD    Allergies Gabapentin and Adhesive [tape]  Family History  Problem Relation Age of Onset   Stroke Mother    CVA Mother    Hypertension Mother    Hypertension Father    Hypertension Sister    Diabetes Sister    Cancer Sister 54       Breast   Stroke Brother    Hypertension Brother    Breast cancer Maternal Aunt 70    Social History Social History   Tobacco Use   Smoking status: Never   Smokeless tobacco: Never  Vaping Use   Vaping Use: Never used  Substance Use Topics   Alcohol  use: No    Alcohol/week: 0.0 standard drinks   Drug use: No    Review of Systems  Constitutional: No fever/chills Eyes: No visual changes. ENT: No sore throat. Cardiovascular: Denies chest pain. Respiratory: Positive for cough.  Denies shortness of breath. Gastrointestinal: No abdominal pain.  Positive for nausea, vomiting and diarrhea.  No constipation. Genitourinary: Negative for dysuria. Musculoskeletal: Negative for back pain. Skin: Negative for rash. Neurological: Negative for headaches, focal weakness or numbness.   ____________________________________________   PHYSICAL EXAM:  VITAL SIGNS: ED Triage Vitals  Enc Vitals Group     BP 10/24/20 1749 (!) 162/93     Pulse Rate 10/24/20 1749 80     Resp 10/24/20 1749 18     Temp 10/24/20 1749 98.7 F (37.1 C)     Temp Source 10/24/20 1749 Oral     SpO2 10/24/20 1749 97 %     Weight 10/24/20 1744 120 lb (54.4 kg)     Height 10/24/20 1744 5\' 5"  (1.651 m)     Head Circumference --      Peak Flow --      Pain Score --      Pain Loc --      Pain Edu? --      Excl. in [place]? --     Constitutional: Alert and oriented.  Cachectic appearing and in mild acute distress. Eyes: Conjunctivae are normal. PERRL. EOMI. Head: Atraumatic. Nose: No congestion/rhinnorhea. Mouth/Throat: Mucous membranes are mildly dry. Neck: No stridor.   Cardiovascular: Normal rate, regular rhythm. Grossly normal heart sounds.  Good peripheral circulation. Respiratory: Normal respiratory effort.  No retractions. Lungs CTAB. Gastrointestinal: Soft and nontender to light or deep palpation. No distention. No abdominal bruits. No CVA tenderness. Musculoskeletal: No lower extremity tenderness nor edema.  No joint effusions. Neurologic:  Normal speech and language. No gross focal neurologic deficits are appreciated.  Baseline essential tremor noted. Skin:  Skin is warm, dry and intact. No rash noted. Psychiatric: Mood and affect are normal. Speech and  behavior are normal.  ____________________________________________   LABS (all labs ordered are listed, but only abnormal results are displayed)  Labs Reviewed  RESP PANEL BY RT-PCR (FLU A&B, COVID) ARPGX2 - Abnormal; Notable for the following components:      Result Value   SARS Coronavirus 2 by RT PCR POSITIVE (*)    All other components within normal limits  COMPREHENSIVE METABOLIC PANEL - Abnormal;  Notable for the following components:   BUN 42 (*)    Creatinine, Ser 9.78 (*)    GFR, Estimated 4 (*)    All other components within normal limits  CBC - Abnormal; Notable for the following components:   WBC 3.5 (*)    Platelets 131 (*)    All other components within normal limits  BRAIN NATRIURETIC PEPTIDE - Abnormal; Notable for the following components:   B Natriuretic Peptide 452.3 (*)    All other components within normal limits  D-DIMER, QUANTITATIVE - Abnormal; Notable for the following components:   D-Dimer, Quant 0.58 (*)    All other components within normal limits  TROPONIN I (HIGH SENSITIVITY) - Abnormal; Notable for the following components:   Troponin I (High Sensitivity) 21 (*)    All other components within normal limits  TROPONIN I (HIGH SENSITIVITY) - Abnormal; Notable for the following components:   Troponin I (High Sensitivity) 27 (*)    All other components within normal limits  C DIFFICILE QUICK SCREEN W PCR REFLEX    GASTROINTESTINAL PANEL BY PCR, STOOL (REPLACES STOOL CULTURE)  LIPASE, BLOOD  FIBRINOGEN  PROCALCITONIN  C-REACTIVE PROTEIN  FERRITIN  HEPATITIS B SURFACE ANTIGEN  LACTATE DEHYDROGENASE  TYPE AND SCREEN   ____________________________________________  EKG  ED ECG REPORT I, Tamel Abel J, the attending physician, personally viewed and interpreted this ECG.   Date: 10/25/2020  EKG Time: 0002  Rate: 70  Rhythm: normal sinus rhythm  Axis: Normal  Intervals:none  ST&T Change:  Nonspecific  ____________________________________________  RADIOLOGY I, Briannon Boggio J, personally viewed and evaluated these images (plain radiographs) as part of my medical decision making, as well as reviewing the written report by the radiologist.  ED MD interpretation: No acute cardiopulmonary process; unremarkable CT abdomen/pelvis  Official radiology report(s): CT Abdomen Pelvis Wo Contrast  Result Date: 10/25/2020 CLINICAL DATA:  Nausea, vomiting, diarrhea, weakness EXAM: CT ABDOMEN AND PELVIS WITHOUT CONTRAST TECHNIQUE: Multidetector CT imaging of the abdomen and pelvis was performed following the standard protocol without IV contrast. COMPARISON:  03/06/2016 FINDINGS: Lower chest: Coronary and coarse aortic atheromatous calcifications. No pleural or pericardial effusion. Hepatobiliary: No focal liver abnormality is seen. No gallstones, gallbladder wall thickening, or biliary dilatation. Pancreas: Unremarkable. No pancreatic ductal dilatation or surrounding inflammatory changes. Spleen: Normal in size without focal abnormality. Adrenals/Urinary Tract: Adrenal glands unremarkable. Bilateral renal parenchymal atrophy with innumerable cystic and hyperdense lesions, progressive since previous exam. Scattered calcifications measuring up to 5 mm on the right, 7 mm left lower pole. No hydronephrosis. Urinary bladder is nondistended. Stomach/Bowel: Stomach is decompressed. Small bowel is nondistended, unremarkable. Post appendectomy. The colon is nondilated, unremarkable. Vascular/Lymphatic: Extensive heavily calcified aortoiliac atheromatous plaque without aneurysm. No abdominal or pelvic adenopathy. Reproductive: Status post hysterectomy. No adnexal masses. Other: Bilateral pelvic phleboliths.  No ascites.  No free air. Musculoskeletal: Diffuse sclerosis throughout the lumbosacral spine as before. Degenerative disc disease L3-S1. No fracture or worrisome bone lesion. IMPRESSION: 1. No acute findings. 2.  Coronary and Aortic Atherosclerosis (ICD10-170.0). 3. Progressive bilateral renal calcifications and lesions, many cystic although the hyperdense lesions are incompletely characterized on this noncontrast study. 4. Chronic osseous sclerosis likely related to renal insufficiency. Electronically Signed   By: Lucrezia Europe M.D.   On: 10/25/2020 06:12   DG Chest 2 View  Result Date: 10/25/2020 CLINICAL DATA:  Cough, vomiting, diarrhea EXAM: CHEST - 2 VIEW COMPARISON:  01/22/2020 FINDINGS: Lungs are clear.  No pleural effusion or pneumothorax. Heart is normal in size.  Thoracic aortic atherosclerosis. Left sided dual lumen dialysis catheter terminating in the upper right atrium. IMPRESSION: No evidence of acute cardiopulmonary disease. Electronically Signed   By: Julian Hy M.D.   On: 10/25/2020 02:43    ____________________________________________   PROCEDURES  Procedure(s) performed (including Critical Care):  Procedures   ____________________________________________   INITIAL IMPRESSION / ASSESSMENT AND PLAN / ED COURSE  As part of my medical decision making, I reviewed the following data within the [place] History obtained from family, Nursing notes reviewed and incorporated, Labs reviewed, EKG interpreted, Old chart reviewed, Radiograph reviewed, and Notes from prior ED visits     65 year old female who presents with 1 week history of nausea/vomiting/diarrhea, now with generalized weakness. Differential diagnosis includes, but is not limited to, ovarian cyst, ovarian torsion, acute appendicitis, diverticulitis, urinary tract infection/pyelonephritis, endometriosis, bowel obstruction, colitis, renal colic, gastroenteritis, hernia, etc.   Laboratory results unremarkable.  Elevated troponin likely secondary to renal disease.  Patient appears clinically dry; will administer judicious IV hydration, IV antiemetic.  Obtain respiratory panel, chest x-ray for  cough/congestion.  CT abdomen/pelvis given 1 week of nausea, vomiting, diarrhea.  Stool studies ordered if patient is able to provide sample.  Clinical Course as of 10/25/20 4076  Wed Oct 25, 2020  0507 Patient is COVID-positive.  Updated patient and her daughter of all test results thus far.  Delay secondary to CT scan.  She will be moved to a treatment room and out of the hallway.  We will start remdesivir given patient's extreme weakness. [JS]  [id number] CT unremarkable.  Will discuss with hospitalist services for admission. [JS]    Clinical Course User Index [JS] Paulette Blanch, MD     ____________________________________________   FINAL CLINICAL IMPRESSION(S) / ED DIAGNOSES  Final diagnoses:  Nausea vomiting and diarrhea  Weakness generalized  COVID-19     ED Discharge Orders     None        Note:  This document was prepared using Dragon voice recognition software and may include unintentional dictation errors.    Paulette Blanch, MD 10/25/20 [phone]

## 2020-10-25 NOTE — H&P (Addendum)
History and Physical    Teresa Cook VZD:[id number] DOB: Apr 15, 1955 DOA: 10/25/2020  Referring MD/NP/PA:   PCP: Murlean Iba, MD   Patient coming from:  The patient is coming from home.  At baseline, pt is independent for most of ADL.        Chief Complaint: cough, shortness breath, chest congestion, nausea, vomiting, diarrhea, generalized weakness  HPI: Teresa Cook is a 65 y.o. female with medical history significant of ESRD-HD (MWF), hypertension, gout, depression with anxiety, sickle cell trait, DVT on Coumadin, sCHF with EF 40-45%, bilateral breast cancer (s/p of bilateral mastectomy), seizure not on medications currently, who presents with cough, shortness breath, chest congestion, nausea, vomiting, diarrhea and generalized weakness.  Patient states that she has been sick for more than a week.  She states that she has cough with little mucus production, shortness of breath and chest congestion, no active chest pain.  She also reports sore throat. She was treated with Z pak, but significant improvement. She developed nausea, intermittent nonbilious and nonbloody vomiting, diarrhea.  No abdominal pain.  Patient states she has been having 3-4 times of watery diarrhea every day.  Currently no fever or chills.  Patient had dialysis yesterday.  ED Course: pt was found to have positive COVID PCR, WBC 3.5, BNP 452, troponin level 27, negative C. difficile and GI pathogen panel, potassium 4.4, bicarbonate 25, creatinine 9.78, BUN 42, INR 3.2, temperature normal, blood pressure 144/83, heart rate 80, RR 18, oxygen saturation 98% on room air.  Chest x-ray negative.  Patient is admitted to [facility] as inpatient  CT abdomen/pelvis: 1. No acute findings. 2. Coronary and Aortic Atherosclerosis (ICD10-170.0). 3. Progressive bilateral renal calcifications and lesions, many cystic although the hyperdense lesions are incompletely characterized on this noncontrast study. 4. Chronic osseous  sclerosis likely related to renal insufficiency.  Review of Systems:   General: no fevers, chills, no body weight gain, has poor appetite, has fatigue HEENT: no blurry vision, hearing changes or sore throat Respiratory: has dyspnea, coughing, no wheezing CV: no chest pain, no palpitations GI: has nausea, vomiting,  diarrhea, no constipation, abdominal pain, GU: no dysuria, burning on urination, increased urinary frequency, hematuria  Ext: no leg edema Neuro: no unilateral weakness, numbness, or tingling, no vision change or hearing loss Skin: no rash, no skin tear. MSK: No muscle spasm, no deformity, no limitation of range of movement in spin Heme: No easy bruising.  Travel history: No recent long distant travel.  Allergy:  Allergies  Allergen Reactions   Gabapentin Other (See Comments)    Seizure   Adhesive [Tape] Itching    Silk tape is ok to use.    Past Medical History:  Diagnosis Date   Anemia    Anxiety    Breast cancer ([place]) 01/2016   bilateral   CHF (congestive heart failure) (HCC)    Chronic kidney disease    Depression    Dialysis patient [facility])    DVT (deep venous thrombosis) ([place])    left leg   DVT (deep venous thrombosis) ([place]) 1985   right thigh   Dysrhythmia    Gout    Headache    HTN (hypertension)    Hypertension    Parathyroid abnormality (HCC)    Parathyroid disease ([place])    Pneumonia 12/2015   Psoriasis    Renal insufficiency    Sickle cell trait (HCC)    traits    Past Surgical History:  Procedure Laterality Date   ABDOMINAL HYSTERECTOMY  1980   APPENDECTOMY     BREAST BIOPSY Left 10/28/2013   benign   BREAST EXCISIONAL BIOPSY Left 2002   benign   CORONARY ANGIOGRAPHY N/A 04/04/2020   Procedure: CORONARY ANGIOGRAPHY;  Surgeon: Wellington Hampshire, MD;  Location: [place] [facility];  Service: Cardiovascular;  Laterality: N/A;   INSERTION OF DIALYSIS CATHETER  2014   LIPOMA EXCISION N/A 01/23/2016   Procedure: EXCISION LIPOMA;   Surgeon: Hubbard Robinson, MD;  Location: [facility];  Service: General;  Laterality: N/A;   MASTECTOMY W/ SENTINEL NODE BIOPSY Bilateral 01/23/2016   Procedure: bilateral MASTECTOMY WITH  bilateral SENTINEL LYMPH NODE BIOPSY possible left axillary node dissection forehead lipoma removal;  Surgeon: Hubbard Robinson, MD;  Location: [facility] OR[facility];  Service: General;  Laterality: Bilateral;   PARTIAL HYSTERECTOMY     PERIPHERAL VASCULAR CATHETERIZATION N/A 12/25/2015   Procedure: Dialysis/Perma Catheter Insertion;  Surgeon: Algernon Huxley, MD;  Location: [place] [facility];  Service: Cardiovascular;  Laterality: N/A;   PERIPHERAL VASCULAR CATHETERIZATION Left 01/22/2016   Procedure: Dialysis/Perma Catheter Insertion;  Surgeon: Algernon Huxley, MD;  Location: [place] [facility];  Service: Cardiovascular;  Laterality: Left;   PERIPHERAL VASCULAR CATHETERIZATION N/A 01/26/2016   Procedure: Dialysis/Perma Catheter Insertion;  Surgeon: Katha Cabal, MD;  Location: [place] [facility];  Service: Cardiovascular;  Laterality: N/A;   PORT-A-CATH REMOVAL N/A 12/20/2015   Procedure: REMOVAL PORT-A-CATH;  Surgeon: Hubbard Robinson, MD;  Location: [facility];  Service: General;  Laterality: N/A;  left     PORTACATH PLACEMENT Left 08/21/2015   Procedure: INSERTION PORT-A-CATH;  Surgeon: Hubbard Robinson, MD;  Location: [facility];  Service: General;  Laterality: Left;   REMOVAL OF A DIALYSIS CATHETER  2017   RIGHT HEART CATH N/A 04/04/2020   Procedure: RIGHT HEART CATH;  Surgeon: Wellington Hampshire, MD;  Location: [place] [facility];  Service: Cardiovascular;  Laterality: N/A;    Social History:  reports that she has never smoked. She has never used smokeless tobacco. She reports that she does not drink alcohol and does not use drugs.  Family History:  Family History  Problem Relation Age of Onset   Stroke Mother    CVA Mother    Hypertension Mother    Hypertension Father    Hypertension Sister    Diabetes  Sister    Cancer Sister 76       Breast   Stroke Brother    Hypertension Brother    Breast cancer Maternal Aunt 70     Prior to Admission medications   Medication Sig Start Date End Date Taking? Authorizing Provider  aspirin-acetaminophen-caffeine (EXCEDRIN MIGRAINE) [phone] MG tablet Take 1-2 tablets by mouth every 6 (six) hours as needed for headache.    [provider]  b complex-vitamin c-folic acid (NEPHRO-VITE) 0.8 MG TABS tablet Take 1 tablet by mouth daily.    [provider]  cinacalcet (SENSIPAR) 60 MG tablet Take 60 mg by mouth daily before lunch.    [provider]  metoprolol succinate (TOPROL-XL) 50 MG 24 hr tablet Take 1 tablet (50 mg total) by mouth daily. Take with or immediately following a meal. 01/26/20   Danford, Suann Larry, MD  pantoprazole (PROTONIX) 40 MG tablet Take 1 tablet (40 mg total) by mouth daily. 01/26/20   Danford, Suann Larry, MD  sacubitril-valsartan (ENTRESTO) 24-26 MG Take 1 tablet by mouth 2 (two) times daily. 02/29/20   Kate Sable, MD  sertraline (ZOLOFT) 100  MG tablet Take 1.5 tablets (150 mg total) by mouth daily. 01/26/20   Danford, Suann Larry, MD  sevelamer carbonate (RENVELA) 800 MG tablet Take 800-2,400 mg by mouth See admin instructions. Take 3 tablets (2400mg ) by mouth three times daily before meals and take 1 tablet (800mg ) by mouth daily before a snack    [provider]  traZODone (DESYREL) 50 MG tablet Take 1 tablet (50 mg total) by mouth at bedtime. May increase to 2 tablets if needed. 01/26/20   Danford, Suann Larry, MD  warfarin (COUMADIN) 5 MG tablet Take 1-1.5 tablets (5-7.5 mg total) by mouth See admin instructions. Take 1 tablet (5mg ) by mouth every Monday, Wednesday, Friday, Saturday and Sunday evening and take 1.5  tablets (7.5mg ) by mouth every Tuesday and Thursday evening 01/26/20   Danford, Suann Larry, MD  zolpidem (AMBIEN) 10 MG tablet Take 1 tablet (10 mg total) by mouth at  bedtime. 01/26/20   Edwin Dada, MD    Physical Exam: Vitals:   10/25/20 1649 10/25/20 1700 10/25/20 1701 10/25/20 1715  BP: (!) 162/92 (!) 208/109 (!) 175/105 (!) 157/101  Pulse:  76 76 73  Resp:    (!) 23  Temp:      TempSrc:      SpO2:  98% 96% 96%  Weight:      Height:       General: Not in acute distress HEENT:       Eyes: PERRL, EOMI, no scleral icterus.       ENT: No discharge from the ears and nose, no pharynx injection, no tonsillar enlargement.        Neck: No JVD, no bruit, no mass felt. Heme: No neck lymph node enlargement. Cardiac: S1/S2, RRR, No murmurs, No gallops or rubs. Respiratory: No rales, wheezing, rhonchi or rubs. GI: Soft, nondistended, nontender, no rebound pain, no organomegaly, BS present. GU: No hematuria Ext: No pitting leg edema bilaterally. 1+DP/PT pulse bilaterally. Musculoskeletal: No joint deformities, No joint redness or warmth, no limitation of ROM in spin. Skin: No rashes.  Neuro: Alert, oriented X3, cranial nerves II-XII grossly intact, moves all extremities normally.  Psych: Patient is not psychotic, no suicidal or hemocidal ideation.  Labs on Admission: I have personally reviewed following labs and imaging studies  CBC: Recent Labs  Lab 10/24/20 1747  WBC 3.5*  HGB 12.4  HCT 36.0  MCV 88.2  PLT 034*   Basic Metabolic Panel: Recent Labs  Lab 10/24/20 1747  NA 137  K 4.2  CL 98  CO2 25  GLUCOSE 93  BUN 42*  CREATININE 9.78*  CALCIUM 8.9   GFR: Estimated Creatinine Clearance: 4.9 mL/min (A) (by C-G formula based on SCr of 9.78 mg/dL (H)). Liver Function Tests: Recent Labs  Lab 10/24/20 1747  AST 25  ALT 20  ALKPHOS 72  BILITOT 0.9  PROT 7.1  ALBUMIN 3.7   Recent Labs  Lab 10/24/20 1747  LIPASE 44   No results for input(s): AMMONIA in the last 168 hours. Coagulation Profile: Recent Labs  Lab 10/25/20 0523  INR 3.2*   Cardiac Enzymes: No results for input(s): CKTOTAL, CKMB, CKMBINDEX,  TROPONINI in the last 168 hours. BNP (last 3 results) No results for input(s): PROBNP in the last 8760 hours. HbA1C: No results for input(s): HGBA1C in the last 72 hours. CBG: No results for input(s): GLUCAP in the last 168 hours. Lipid Profile: No results for input(s): CHOL, HDL, LDLCALC, TRIG, CHOLHDL, LDLDIRECT in the last 72 hours.  Thyroid Function Tests: No results for input(s): TSH, T4TOTAL, FREET4, T3FREE, THYROIDAB in the last 72 hours. Anemia Panel: Recent Labs    10/25/20 0523  FERRITIN 1,401*   Urine analysis:    Component Value Date/Time   COLORURINE STRAW (A) 09/09/2016 1521   APPEARANCEUR CLEAR (A) 09/09/2016 1521   APPEARANCEUR Clear 09/08/2013 0133   LABSPEC 1.008 09/09/2016 1521   LABSPEC 1.013 09/08/2013 0133   PHURINE 8.0 09/09/2016 1521   GLUCOSEU 50 (A) 09/09/2016 1521   GLUCOSEU 50 mg/dL 09/08/2013 0133   HGBUR NEGATIVE 09/09/2016 1521   BILIRUBINUR NEGATIVE 09/09/2016 1521   BILIRUBINUR Negative 09/08/2013 0133   KETONESUR NEGATIVE 09/09/2016 1521   PROTEINUR 100 (A) 09/09/2016 1521   NITRITE NEGATIVE 09/09/2016 1521   LEUKOCYTESUR NEGATIVE 09/09/2016 1521   LEUKOCYTESUR Negative 09/08/2013 0133   Sepsis Labs: @LABRCNTIP (procalcitonin:4,lacticidven:4) ) Recent Results (from the past 240 hour(s))  Resp Panel by RT-PCR (Flu A&B, Covid) Nasopharyngeal Swab     Status: Abnormal   Collection Time: 10/25/20  3:10 AM   Specimen: Nasopharyngeal Swab; Nasopharyngeal(NP) swabs in vial transport medium  Result Value Ref Range Status   SARS Coronavirus 2 by RT PCR POSITIVE (A) NEGATIVE Final    Comment: RESULT CALLED TO, READ BACK BY AND VERIFIED WITH: TIFFANY DEAN RN 0502 10/25/20 HNM (NOTE) SARS-CoV-2 target nucleic acids are DETECTED.  The SARS-CoV-2 RNA is generally detectable in upper respiratory specimens during the acute phase of infection. Positive results are indicative of the presence of the identified virus, but do not rule out bacterial  infection or co-infection with other pathogens not detected by the test. Clinical correlation with patient history and other diagnostic information is necessary to determine patient infection status. The expected result is Negative.  Fact Sheet for Patients: EntrepreneurPulse.com.au  Fact Sheet for Healthcare Providers: IncredibleEmployment.be  This test is not yet approved or cleared by the [place] FDA and  has been authorized for detection and/or diagnosis of SARS-CoV-2 by FDA under an Emergency Use Authorization (EUA).  This EUA will remain in effect (meaning this test can be  used) for the duration of  the COVID-19 declaration under Section 564(b)(1) of the Act, 21 U.S.C. section 360bbb-3(b)(1), unless the authorization is terminated or revoked sooner.     Influenza A by PCR NEGATIVE NEGATIVE Final   Influenza B by PCR NEGATIVE NEGATIVE Final    Comment: (NOTE) The Xpert Xpress SARS-CoV-2/FLU/RSV plus assay is intended as an aid in the diagnosis of influenza from Nasopharyngeal swab specimens and should not be used as a sole basis for treatment. Nasal washings and aspirates are unacceptable for Xpert Xpress SARS-CoV-2/FLU/RSV testing.  Fact Sheet for Patients: EntrepreneurPulse.com.au  Fact Sheet for Healthcare Providers: IncredibleEmployment.be  This test is not yet approved or cleared by the [place] FDA and has been authorized for detection and/or diagnosis of SARS-CoV-2 by FDA under an Emergency Use Authorization (EUA). This EUA will remain in effect (meaning this test can be used) for the duration of the COVID-19 declaration under Section 564(b)(1) of the Act, 21 U.S.C. section 360bbb-3(b)(1), unless the authorization is terminated or revoked.  Performed at [facility], [place], [place] [address]   C Difficile Quick Screen w PCR reflex     Status: None    Collection Time: 10/25/20  3:10 AM   Specimen: Stool  Result Value Ref Range Status   C Diff antigen NEGATIVE NEGATIVE Final   C Diff toxin NEGATIVE NEGATIVE Final   C Diff  interpretation No C. difficile detected.  Final    Comment: Performed at [facility], [address] [place], [place] [address]  Gastrointestinal Panel by PCR , Stool     Status: None   Collection Time: 10/25/20  3:10 AM   Specimen: Stool  Result Value Ref Range Status   Campylobacter species NOT DETECTED NOT DETECTED Final   Plesimonas shigelloides NOT DETECTED NOT DETECTED Final   Salmonella species NOT DETECTED NOT DETECTED Final   Yersinia enterocolitica NOT DETECTED NOT DETECTED Final   Vibrio species NOT DETECTED NOT DETECTED Final   Vibrio cholerae NOT DETECTED NOT DETECTED Final   Enteroaggregative E coli (EAEC) NOT DETECTED NOT DETECTED Final   Enteropathogenic E coli (EPEC) NOT DETECTED NOT DETECTED Final   Enterotoxigenic E coli (ETEC) NOT DETECTED NOT DETECTED Final   Shiga like toxin producing E coli (STEC) NOT DETECTED NOT DETECTED Final   Shigella/Enteroinvasive E coli (EIEC) NOT DETECTED NOT DETECTED Final   Cryptosporidium NOT DETECTED NOT DETECTED Final   Cyclospora cayetanensis NOT DETECTED NOT DETECTED Final   Entamoeba histolytica NOT DETECTED NOT DETECTED Final   Giardia lamblia NOT DETECTED NOT DETECTED Final   Adenovirus F40/41 NOT DETECTED NOT DETECTED Final   Astrovirus NOT DETECTED NOT DETECTED Final   Norovirus GI/GII NOT DETECTED NOT DETECTED Final   Rotavirus A NOT DETECTED NOT DETECTED Final   Sapovirus (I, II, IV, and V) NOT DETECTED NOT DETECTED Final    Comment: Performed at [facility], [address] [place], [place] [address]     Radiological Exams on Admission: CT Abdomen Pelvis Wo Contrast  Result Date: 10/25/2020 CLINICAL DATA:  Nausea, vomiting, diarrhea, weakness EXAM: CT ABDOMEN AND PELVIS WITHOUT CONTRAST TECHNIQUE: Multidetector CT imaging  of the abdomen and pelvis was performed following the standard protocol without IV contrast. COMPARISON:  03/06/2016 FINDINGS: Lower chest: Coronary and coarse aortic atheromatous calcifications. No pleural or pericardial effusion. Hepatobiliary: No focal liver abnormality is seen. No gallstones, gallbladder wall thickening, or biliary dilatation. Pancreas: Unremarkable. No pancreatic ductal dilatation or surrounding inflammatory changes. Spleen: Normal in size without focal abnormality. Adrenals/Urinary Tract: Adrenal glands unremarkable. Bilateral renal parenchymal atrophy with innumerable cystic and hyperdense lesions, progressive since previous exam. Scattered calcifications measuring up to 5 mm on the right, 7 mm left lower pole. No hydronephrosis. Urinary bladder is nondistended. Stomach/Bowel: Stomach is decompressed. Small bowel is nondistended, unremarkable. Post appendectomy. The colon is nondilated, unremarkable. Vascular/Lymphatic: Extensive heavily calcified aortoiliac atheromatous plaque without aneurysm. No abdominal or pelvic adenopathy. Reproductive: Status post hysterectomy. No adnexal masses. Other: Bilateral pelvic phleboliths.  No ascites.  No free air. Musculoskeletal: Diffuse sclerosis throughout the lumbosacral spine as before. Degenerative disc disease L3-S1. No fracture or worrisome bone lesion. IMPRESSION: 1. No acute findings. 2. Coronary and Aortic Atherosclerosis (ICD10-170.0). 3. Progressive bilateral renal calcifications and lesions, many cystic although the hyperdense lesions are incompletely characterized on this noncontrast study. 4. Chronic osseous sclerosis likely related to renal insufficiency. Electronically Signed   By: Lucrezia Europe M.D.   On: 10/25/2020 06:12   DG Chest 2 View  Result Date: 10/25/2020 CLINICAL DATA:  Cough, vomiting, diarrhea EXAM: CHEST - 2 VIEW COMPARISON:  01/22/2020 FINDINGS: Lungs are clear.  No pleural effusion or pneumothorax. Heart is normal in  size.  Thoracic aortic atherosclerosis. Left sided dual lumen dialysis catheter terminating in the upper right atrium. IMPRESSION: No evidence of acute cardiopulmonary disease. Electronically Signed   By: Julian Hy M.D.   On:  10/25/2020 02:43     EKG: I have personally reviewed.  Sinus rhythm, QTC 401, T wave inversion in V3-V5  Assessment/Plan Principal Problem:   COVID-19 virus infection Active Problems:   Depression   ESRD on dialysis (HCC)   HTN (hypertension)   Elevated troponin   Protein-calorie malnutrition, severe   Generalized weakness   DVT (deep venous thrombosis) (HCC)   Chronic systolic CHF (congestive heart failure) (HCC)   Nausea vomiting and diarrhea   COVID-19 virus infection: Oxygen saturation 92% on room air.  Chest x-ray negative.  Since patient has multiple symptoms related to COVID infection.  Initially remdesivir and steroids were started.  She received 1 dose of IV Solu-Medrol in ED. Since patient does not have oxygen desaturation, will discontinue steroids.  -pt is admitted to med-surg bed as inpt by acceptig MD. -Continue remdesivir -As needed albuterol and Mucinex -Follow-up inflammatory markers  Depression -Continue home medications  ESRD on dialysis (MWF) -Dr. Holley Raring was consulted for dialysis  HTN (hypertension) -IV hydralazine as needed -Metoprolol, Entresto  Elevated troponin: Troponin is minimally elevated at 27, no chest pain.  This is at baseline elevation.  Patient recent baseline creatinine elevation 35-57. -Will not continue to trend troponin  Protein-calorie malnutrition, severe -ensure  Generalized weakness -PT/OT  DVT (deep venous thrombosis) (HCC) -Continue Coumadin per pharm dosing  Chronic systolic CHF (congestive heart failure) ([place]): 2D echo on 01/23/2020 showed EF of 40-45%.  Patient does not have leg edema, no JVD.  No pulm edema chest x-ray.  CHF is compensated. -Volume management per renal by  dialysis  Nausea, vomiting, diarrhea: Negative C diff and GI pathogen panel.  Most likely due to COVID infection -As needed Imodium    DVT ppx: on Couamdin Code Status: Partial code (I discussed with the patient, and explained the meaning of CODE STATUS, patient wants to be partial code, OK for CPR, but no intubation). This is confirmed by her daughter  Family Communication: Yes, patient's  daughter by phone Disposition Plan:  Anticipate discharge back to previous environment Consults called:  Dr. Holley Raring of renal Admission status and Level of care: Med-Surg:   as inpt      Status is: Inpatient  Remains inpatient appropriate because: Patient has multiple comorbidities, now presents with COVID infection, patient is at high risk of deteriorating due to multiple complicated history , will need to be treated in the hospital for the next 2 days.          Date of Service 10/25/2020    Ivor Costa [facility]   If 7PM-7AM, please contact night-coverage [email or web address] 10/25/2020, 5:43 PM

## 2020-10-25 NOTE — Consult Note (Signed)
ANTICOAGULATION CONSULT NOTE - Initial Consult  Pharmacy Consult for warfarin Indication: DVT  Allergies  Allergen Reactions   Gabapentin Other (See Comments)    Seizure   Adhesive [Tape] Itching    Silk tape is ok to use.    Patient Measurements: Height: 5\' 5"  (165.1 cm) Weight: 54.4 kg (120 lb) IBW/kg (Calculated) : 57  Vital Signs: Temp: 98.4 F (36.9 C) (10/19 0507) Temp Source: Oral (10/19 0507) BP: 144/83 (10/19 0507) Pulse Rate: 71 (10/19 0507)  Labs: Recent Labs    10/24/20 1747 10/25/20 0007 10/25/20 0523  HGB 12.4  --   --   HCT 36.0  --   --   PLT 131*  --   --   LABPROT  --   --  32.9*  INR  --   --  3.2*  CREATININE 9.78*  --   --   TROPONINIHS 21* 27*  --     Estimated Creatinine Clearance: 4.9 mL/min (A) (by C-G formula based on SCr of 9.78 mg/dL (H)).   Medical History: Past Medical History:  Diagnosis Date   Anemia    Anxiety    Breast cancer ([place]) 01/2016   bilateral   CHF (congestive heart failure) ([place])    Chronic kidney disease    Depression    Dialysis patient [facility])    DVT (deep venous thrombosis) ([place])    left leg   DVT (deep venous thrombosis) ([place]) 1985   right thigh   Dysrhythmia    Gout    Headache    HTN (hypertension)    Hypertension    Parathyroid abnormality (HCC)    Parathyroid disease ([place])    Pneumonia 12/2015   Psoriasis    Renal insufficiency    Sickle cell trait (HCC)    traits    Medications:  Scheduled:   Chlorhexidine Gluconate Cloth  6 each Topical Q0600   feeding supplement  237 mL Oral BID BM   methylPREDNISolone (SOLU-MEDROL) injection  1 mg/kg Intravenous Q12H   Followed by   Derrill Memo ON 10/28/2020] predniSONE  50 mg Oral Daily    Assessment: 65yo F w/ PMH of breast cancer, systolic heart failure, DVT in 2014 on warfarin, HTN, ESRD on HD, hx of seizures, and depression who was admitted to hospital for emesis. Pharmacy was consulted for warfarin dosing.   Home warfarin regimen: 5mg  MWFSS and  7.5mg  Tues/Thurs (40mg /week, 5.7mg /day)  Last dose 10/18  Drug interactions: Sertraline (increases bleeding risk), remdesivir (increases INR), azithromycin (increases INR, completed Zpak ~10/17), methylprednisolone (increase INR)  Date/time INR Dose 10/18 0523 3.2 3mg   Goal of Therapy:  INR 2-3 Monitor platelets by anticoagulation protocol: Yes   Plan:  INR supratherapeutic, likely secondary to recent course of azithromycin and receiving 7.5mg  10/18 Will give warfarin 3mg  (60% reduction of normal daily dose) Will check INR with AM labs Will monitor daily CBC until INR stabilizes    Narda Rutherford, PharmD Pharmacy Resident  10/25/2020 10:48 AM

## 2020-10-25 NOTE — Progress Notes (Signed)
Remdesivir - Pharmacy Brief Note   O:  CXR: "No evidence of acute cardiopulmonary disease." SpO2: 97-100% on RA   A/P:  Remdesivir 200 mg IVPB once followed by 100 mg IVPB daily x 4 days.   Renda Rolls, PharmD, [facility] 10/25/2020 5:21 AM

## 2020-10-25 NOTE — ED Notes (Signed)
Patient reports she does not make urine.  Will be unable to give a urine sample

## 2020-10-25 NOTE — Progress Notes (Signed)
[facility]  ROUNDING NOTE   Subjective:   Teresa Cook is a 65 year old African-American female with past medical history of anemia, CHF, hypertension, parathyroid disorder, end-stage renal disease on hemodialysis.  Patient presents to the ED with complaints of nausea, vomiting, diarrhea and generalized weakness for 1 week.  She has been admitted for Generalized weakness [R53.1] COVID-19 virus infection [U07.1]  Patient is known to our clinic and receives outpatient dialysis treatments at [facility], supervised by Dr. Candiss Norse.  She reports no missed treatments during this illness.  Last dialysis treatment being Monday.  Patient states prior to current illness she was experiencing cough, congestion, and sore throat.  She was given a Z-Pak.  Current symptoms began after completing Z-Pak.  She has been unable to tolerate oral intake for 1 to 2 days.  Intermittent diarrhea over the past week.  Denies shortness of breath and cough.  Does not recall fever or chills.  Patient is found to be COVID-positive.  Other notable labs include slightly elevated troponin 21, WBC 3.5, platelets 131, and BNP 452.  C. difficile negative.  Chest x-ray negative for pleural effusion or pneumothorax.  CT abdomen pelvis negative for hydronephrosis, evidence of progressive bilateral renal calcifications or lesions.  We have been consulted to manage dialysis needs during admission.   Objective:  Vital signs in last 24 hours:  Temp:  [98.4 F (36.9 C)-98.9 F (37.2 C)] 98.4 F (36.9 C) (10/19 0507) Pulse Rate:  [69-80] 69 (10/19 1130) Resp:  [16-20] 18 (10/19 1130) BP: (141-162)/(83-101) 141/85 (10/19 1130) SpO2:  [92 %-100 %] 92 % (10/19 1130) Weight:  [54.4 kg] 54.4 kg (10/18 1744)  Weight change:  Filed Weights   10/24/20 1744  Weight: 54.4 kg    Intake/Output: I/O last 3 completed shifts: In: 100 [IV Piggyback:100] Out: -    Intake/Output this shift:  No intake/output data  recorded.  Physical Exam: General: NAD, resting on stretcher  Head: Normocephalic, atraumatic. Moist oral mucosal membranes  Eyes: Anicteric  Lungs:  Clear to auscultation, normal effort  Heart: Regular rate and rhythm  Abdomen:  Soft, nontender,   Extremities: No peripheral edema.  Neurologic: Alert, oriented, moving all four extremities  Skin: No lesions  Access: Left IJ PermCath    Basic Metabolic Panel: Recent Labs  Lab 10/24/20 1747  NA 137  K 4.2  CL 98  CO2 25  GLUCOSE 93  BUN 42*  CREATININE 9.78*  CALCIUM 8.9    Liver Function Tests: Recent Labs  Lab 10/24/20 1747  AST 25  ALT 20  ALKPHOS 72  BILITOT 0.9  PROT 7.1  ALBUMIN 3.7   Recent Labs  Lab 10/24/20 1747  LIPASE 44   No results for input(s): AMMONIA in the last 168 hours.  CBC: Recent Labs  Lab 10/24/20 1747  WBC 3.5*  HGB 12.4  HCT 36.0  MCV 88.2  PLT 131*    Cardiac Enzymes: No results for input(s): CKTOTAL, CKMB, CKMBINDEX, TROPONINI in the last 168 hours.  BNP: Invalid input(s): POCBNP  CBG: No results for input(s): GLUCAP in the last 168 hours.  Microbiology: Results for orders placed or performed during the hospital encounter of 10/25/20  Resp Panel by RT-PCR (Flu A&B, Covid) Nasopharyngeal Swab     Status: Abnormal   Collection Time: 10/25/20  3:10 AM   Specimen: Nasopharyngeal Swab; Nasopharyngeal(NP) swabs in vial transport medium  Result Value Ref Range Status   SARS Coronavirus 2 by RT PCR POSITIVE (A) NEGATIVE  Final    Comment: RESULT CALLED TO, READ BACK BY AND VERIFIED WITH: TIFFANY DEAN RN 0502 10/25/20 HNM (NOTE) SARS-CoV-2 target nucleic acids are DETECTED.  The SARS-CoV-2 RNA is generally detectable in upper respiratory specimens during the acute phase of infection. Positive results are indicative of the presence of the identified virus, but do not rule out bacterial infection or co-infection with other pathogens not detected by the test. Clinical  correlation with patient history and other diagnostic information is necessary to determine patient infection status. The expected result is Negative.  Fact Sheet for Patients: EntrepreneurPulse.com.au  Fact Sheet for Healthcare Providers: IncredibleEmployment.be  This test is not yet approved or cleared by the [place] FDA and  has been authorized for detection and/or diagnosis of SARS-CoV-2 by FDA under an Emergency Use Authorization (EUA).  This EUA will remain in effect (meaning this test can be  used) for the duration of  the COVID-19 declaration under Section 564(b)(1) of the Act, 21 U.S.C. section 360bbb-3(b)(1), unless the authorization is terminated or revoked sooner.     Influenza A by PCR NEGATIVE NEGATIVE Final   Influenza B by PCR NEGATIVE NEGATIVE Final    Comment: (NOTE) The Xpert Xpress SARS-CoV-2/FLU/RSV plus assay is intended as an aid in the diagnosis of influenza from Nasopharyngeal swab specimens and should not be used as a sole basis for treatment. Nasal washings and aspirates are unacceptable for Xpert Xpress SARS-CoV-2/FLU/RSV testing.  Fact Sheet for Patients: EntrepreneurPulse.com.au  Fact Sheet for Healthcare Providers: IncredibleEmployment.be  This test is not yet approved or cleared by the [place] FDA and has been authorized for detection and/or diagnosis of SARS-CoV-2 by FDA under an Emergency Use Authorization (EUA). This EUA will remain in effect (meaning this test can be used) for the duration of the COVID-19 declaration under Section 564(b)(1) of the Act, 21 U.S.C. section 360bbb-3(b)(1), unless the authorization is terminated or revoked.  Performed at [facility], [place], [place] [address]   C Difficile Quick Screen w PCR reflex     Status: None   Collection Time: 10/25/20  3:10 AM   Specimen: Stool  Result Value Ref Range  Status   C Diff antigen NEGATIVE NEGATIVE Final   C Diff toxin NEGATIVE NEGATIVE Final   C Diff interpretation No C. difficile detected.  Final    Comment: Performed at [facility], [address] [place], [place] [address]  Gastrointestinal Panel by PCR , Stool     Status: None   Collection Time: 10/25/20  3:10 AM   Specimen: Stool  Result Value Ref Range Status   Campylobacter species NOT DETECTED NOT DETECTED Final   Plesimonas shigelloides NOT DETECTED NOT DETECTED Final   Salmonella species NOT DETECTED NOT DETECTED Final   Yersinia enterocolitica NOT DETECTED NOT DETECTED Final   Vibrio species NOT DETECTED NOT DETECTED Final   Vibrio cholerae NOT DETECTED NOT DETECTED Final   Enteroaggregative E coli (EAEC) NOT DETECTED NOT DETECTED Final   Enteropathogenic E coli (EPEC) NOT DETECTED NOT DETECTED Final   Enterotoxigenic E coli (ETEC) NOT DETECTED NOT DETECTED Final   Shiga like toxin producing E coli (STEC) NOT DETECTED NOT DETECTED Final   Shigella/Enteroinvasive E coli (EIEC) NOT DETECTED NOT DETECTED Final   Cryptosporidium NOT DETECTED NOT DETECTED Final   Cyclospora cayetanensis NOT DETECTED NOT DETECTED Final   Entamoeba histolytica NOT DETECTED NOT DETECTED Final   Giardia lamblia NOT DETECTED NOT DETECTED Final   Adenovirus F40/41  NOT DETECTED NOT DETECTED Final   Astrovirus NOT DETECTED NOT DETECTED Final   Norovirus GI/GII NOT DETECTED NOT DETECTED Final   Rotavirus A NOT DETECTED NOT DETECTED Final   Sapovirus (I, II, IV, and V) NOT DETECTED NOT DETECTED Final    Comment: Performed at [facility], [address] [place], [place] [address]    Coagulation Studies: Recent Labs    10/25/20 0523  LABPROT 32.9*  INR 3.2*    Urinalysis: No results for input(s): COLORURINE, LABSPEC, PHURINE, GLUCOSEU, HGBUR, BILIRUBINUR, KETONESUR, PROTEINUR, UROBILINOGEN, NITRITE, LEUKOCYTESUR in the last 72 hours.  Invalid input(s): APPERANCEUR     Imaging: CT Abdomen Pelvis Wo Contrast  Result Date: 10/25/2020 CLINICAL DATA:  Nausea, vomiting, diarrhea, weakness EXAM: CT ABDOMEN AND PELVIS WITHOUT CONTRAST TECHNIQUE: Multidetector CT imaging of the abdomen and pelvis was performed following the standard protocol without IV contrast. COMPARISON:  03/06/2016 FINDINGS: Lower chest: Coronary and coarse aortic atheromatous calcifications. No pleural or pericardial effusion. Hepatobiliary: No focal liver abnormality is seen. No gallstones, gallbladder wall thickening, or biliary dilatation. Pancreas: Unremarkable. No pancreatic ductal dilatation or surrounding inflammatory changes. Spleen: Normal in size without focal abnormality. Adrenals/Urinary Tract: Adrenal glands unremarkable. Bilateral renal parenchymal atrophy with innumerable cystic and hyperdense lesions, progressive since previous exam. Scattered calcifications measuring up to 5 mm on the right, 7 mm left lower pole. No hydronephrosis. Urinary bladder is nondistended. Stomach/Bowel: Stomach is decompressed. Small bowel is nondistended, unremarkable. Post appendectomy. The colon is nondilated, unremarkable. Vascular/Lymphatic: Extensive heavily calcified aortoiliac atheromatous plaque without aneurysm. No abdominal or pelvic adenopathy. Reproductive: Status post hysterectomy. No adnexal masses. Other: Bilateral pelvic phleboliths.  No ascites.  No free air. Musculoskeletal: Diffuse sclerosis throughout the lumbosacral spine as before. Degenerative disc disease L3-S1. No fracture or worrisome bone lesion. IMPRESSION: 1. No acute findings. 2. Coronary and Aortic Atherosclerosis (ICD10-170.0). 3. Progressive bilateral renal calcifications and lesions, many cystic although the hyperdense lesions are incompletely characterized on this noncontrast study. 4. Chronic osseous sclerosis likely related to renal insufficiency. Electronically Signed   By: Lucrezia Europe M.D.   On: 10/25/2020 06:12   DG Chest 2  View  Result Date: 10/25/2020 CLINICAL DATA:  Cough, vomiting, diarrhea EXAM: CHEST - 2 VIEW COMPARISON:  01/22/2020 FINDINGS: Lungs are clear.  No pleural effusion or pneumothorax. Heart is normal in size.  Thoracic aortic atherosclerosis. Left sided dual lumen dialysis catheter terminating in the upper right atrium. IMPRESSION: No evidence of acute cardiopulmonary disease. Electronically Signed   By: Julian Hy M.D.   On: 10/25/2020 02:43     Medications:    [START ON 10/26/2020] remdesivir 100 mg in NS 100 mL      Chlorhexidine Gluconate Cloth  6 each Topical Q0600   cinacalcet  60 mg Oral QAC lunch   feeding supplement  237 mL Oral BID BM   methylPREDNISolone (SOLU-MEDROL) injection  1 mg/kg Intravenous Q12H   Followed by   Derrill Memo ON 10/28/2020] predniSONE  50 mg Oral Daily   metoprolol succinate  50 mg Oral Daily   multivitamin  1 tablet Oral QHS   pantoprazole  40 mg Oral Daily   sacubitril-valsartan  1 tablet Oral BID   sertraline  150 mg Oral Daily   sevelamer carbonate  2,400 mg Oral TID WC   warfarin  3 mg Oral ONCE-1600   Warfarin - Pharmacist Dosing Inpatient   Does not apply q1600   zolpidem  5 mg Oral QHS   acetaminophen, albuterol, aspirin-acetaminophen-caffeine, dextromethorphan-guaiFENesin,  hydrALAZINE, loperamide, ondansetron (ZOFRAN) IV, sevelamer carbonate  Assessment/ Plan:  Ms. Julliana Whitmyer is a 65 y.o.  female with past medical history of anemia, CHF, hypertension, parathyroid disorder, end-stage renal disease on hemodialysis.  Patient presents to the ED with complaints of nausea, vomiting, diarrhea and generalized weakness for 1 week.  She has been admitted for Generalized weakness [R53.1] COVID-19 virus infection [U07.1]  [facility]/MWF/Lt Permcath/ 58.0kg  End-stage renal disease on hemodialysis, will maintain outpatient schedule, if possible.  Plan to dialyze later today due to COVID status.  We will maintain UF of 0 mL due to  negative fluid status  2. Anemia of chronic kidney disease  Lab Results  Component Value Date   HGB 12.4 10/24/2020  Hemoglobin remains at target, iron studies indicate anemia of chronic disease.  No need for ESA's at this time  3. Secondary Hyperparathyroidism:  Lab Results  Component Value Date   PTH 344 (H) 01/23/2020   CALCIUM 8.9 10/24/2020   PHOS 5.6 (H) 01/26/2020  Calcium at goal, will obtain phosphorus with a.m. labs  4.  Hypertension with chronic kidney disease.  Home regimen includes metoprolol.  Currently receiving metoprolol and Entresto.  BP currently 141/85  5.  Chronic systolic heart failure.  Echo from 01/23/20 shows EF 40 to 45%  6.  COVID-19 positive Remdesivir started today   LOS: 0 Taler Kushner 10/19/20221:00 PM

## 2020-10-25 NOTE — ED Notes (Signed)
Patient did not want to have all of IV fluid due to hx of "fluid overload."

## 2020-10-26 DIAGNOSIS — I5022 Chronic systolic (congestive) heart failure: Secondary | ICD-10-CM | POA: Diagnosis not present

## 2020-10-26 DIAGNOSIS — U071 COVID-19: Secondary | ICD-10-CM | POA: Diagnosis present

## 2020-10-26 DIAGNOSIS — R112 Nausea with vomiting, unspecified: Secondary | ICD-10-CM | POA: Diagnosis not present

## 2020-10-26 DIAGNOSIS — N186 End stage renal disease: Secondary | ICD-10-CM | POA: Diagnosis not present

## 2020-10-26 DIAGNOSIS — Z992 Dependence on renal dialysis: Secondary | ICD-10-CM | POA: Diagnosis not present

## 2020-10-26 LAB — CBC
HCT: 34.8 % — ABNORMAL LOW (ref 36.0–46.0)
Hemoglobin: 11.7 g/dL — ABNORMAL LOW (ref 12.0–15.0)
MCH: 28.8 pg (ref 26.0–34.0)
MCHC: 33.6 g/dL (ref 30.0–36.0)
MCV: 85.7 fL (ref 80.0–100.0)
Platelets: 165 10*3/uL (ref 150–400)
RBC: 4.06 MIL/uL (ref 3.87–5.11)
RDW: 15 % (ref 11.5–15.5)
WBC: 3.4 10*3/uL — ABNORMAL LOW (ref 4.0–10.5)
nRBC: 0 % (ref 0.0–0.2)

## 2020-10-26 LAB — PROTIME-INR
INR: 2.8 — ABNORMAL HIGH (ref 0.8–1.2)
Prothrombin Time: 29.5 seconds — ABNORMAL HIGH (ref 11.4–15.2)

## 2020-10-26 LAB — COMPREHENSIVE METABOLIC PANEL
ALT: 18 U/L (ref 0–44)
AST: 23 U/L (ref 15–41)
Albumin: 3.4 g/dL — ABNORMAL LOW (ref 3.5–5.0)
Alkaline Phosphatase: 61 U/L (ref 38–126)
Anion gap: 13 (ref 5–15)
BUN: 39 mg/dL — ABNORMAL HIGH (ref 8–23)
CO2: 29 mmol/L (ref 22–32)
Calcium: 9 mg/dL (ref 8.9–10.3)
Chloride: 93 mmol/L — ABNORMAL LOW (ref 98–111)
Creatinine, Ser: 8 mg/dL — ABNORMAL HIGH (ref 0.44–1.00)
GFR, Estimated: 5 mL/min — ABNORMAL LOW (ref >60)
Glucose, Bld: 98 mg/dL (ref 70–99)
Potassium: 3.9 mmol/L (ref 3.5–5.1)
Sodium: 135 mmol/L (ref 135–145)
Total Bilirubin: 0.7 mg/dL (ref 0.3–1.2)
Total Protein: 6.5 g/dL (ref 6.5–8.1)

## 2020-10-26 LAB — C-REACTIVE PROTEIN: CRP: <0.5 mg/dL (ref <1.0)

## 2020-10-26 LAB — PHOSPHORUS: Phosphorus: 5.8 mg/dL — ABNORMAL HIGH (ref 2.5–4.6)

## 2020-10-26 LAB — FERRITIN: Ferritin: 1319 ng/mL — ABNORMAL HIGH (ref 11–307)

## 2020-10-26 LAB — HEPATITIS B SURFACE ANTIBODY, QUANTITATIVE: Hep B S AB Quant (Post): 12 m[IU]/mL (ref >9.9)

## 2020-10-26 MED ORDER — ALBUTEROL SULFATE HFA 108 (90 BASE) MCG/ACT IN AERS: 2.0000 | INHALATION_SPRAY | RESPIRATORY_TRACT | 0 refills | Status: DC | PRN

## 2020-10-26 MED ORDER — WARFARIN SODIUM 5 MG PO TABS: 5.0000 mg | ORAL_TABLET | ORAL | 3 refills | Status: DC

## 2020-10-26 MED ORDER — WARFARIN SODIUM 4 MG PO TABS
4.0000 mg | ORAL_TABLET | Freq: Once | ORAL | Status: DC
  Filled 2020-10-26: qty 1

## 2020-10-26 MED ORDER — LOPERAMIDE HCL 2 MG PO CAPS: 2.0000 mg | ORAL_CAPSULE | Freq: Two times a day (BID) | ORAL | 0 refills | Status: DC | PRN

## 2020-10-26 NOTE — Care Management (Signed)
Elvera Bicker HD liaison notified of admission and discharge.  She is aware that patient is covid positive

## 2020-10-26 NOTE — Discharge Summary (Signed)
Physician Discharge Summary  Patient ID: Teresa Cook MRN: [id number] DOB/AGE: 66-Feb-1957 66 y.o.  Admit date: 10/25/2020 Discharge date: 10/26/2020  Admission Diagnoses:  Discharge Diagnoses:  Principal Problem:   COVID-19 virus infection Active Problems:   Depression   ESRD on dialysis ([place])   HTN (hypertension)   Elevated troponin   Protein-calorie malnutrition, severe   Generalized weakness   DVT (deep venous thrombosis) (HCC)   Chronic systolic CHF (congestive heart failure) (HCC)   Nausea vomiting and diarrhea   Discharged Condition: good  Hospital Course:   Jahira Swiss is a 65 y.o. female with medical history significant of ESRD-HD (MWF), hypertension, gout, depression with anxiety, sickle cell trait, DVT on Coumadin, sCHF with EF 40-45%, bilateral breast cancer (s/p of bilateral mastectomy), seizure not on medications currently, who presents with cough, shortness breath, chest congestion, nausea, vomiting, diarrhea and generalized weakness. She was not found to have positive COVID, but never required oxygen treatment.  Chest x-ray did not show any pneumonia.  Patient was monitored overnight in the hospital, she has been given remdesivir.  No need for steroids. Condition had improved today, appetite has improved.  Diarrhea is better.  Still has no oxygen requirement.  She has some congestion and postnasal draining, will continue as needed Combivent.  COVID-19 virus infection  Viral gastroenteritis secondary to COVID. No pneumonia, no need for steroids. Continue symptomatic treatment with as needed Imodium and as needed Combivent.  End-stage renal disease. Go back to dialysis tomorrow.  Elevated troponin. Due to chronic kidney disease.  DVT. Patient INR is 2.8 today, was 3.2 yesterday.  She is instructed to take warfarin 4 mg today and resume at the regular dose afterwards and follow-up with PCP.  Consults: None  Significant Diagnostic Studies:    Treatments: Remdesivir.  Discharge Exam: Blood pressure (!) 136/99, pulse 81, temperature 98.4 F (36.9 C), temperature source Oral, resp. rate 16, height 5\' 5"  (1.651 m), weight 54.4 kg, SpO2 99 %. General appearance: alert and cooperative Resp: clear to auscultation bilaterally Cardio: regular rate and rhythm, S1, S2 normal, no murmur, click, rub or gallop GI: soft, non-tender; bowel sounds normal; no masses,  no organomegaly Extremities: extremities normal, atraumatic, no cyanosis or edema  Disposition: Discharge disposition: 01-Home or Self Care       Discharge Instructions     Diet - low sodium heart healthy   Complete by: As directed    Increase activity slowly   Complete by: As directed       Allergies as of 10/26/2020       Reactions   Gabapentin Other (See Comments)   Seizure   Adhesive [tape] Itching   Silk tape is ok to use.        Medication List     TAKE these medications    albuterol 108 (90 Base) MCG/ACT inhaler Commonly known as: VENTOLIN HFA Inhale 2 puffs into the lungs every 4 (four) hours as needed for wheezing or shortness of breath.   aspirin-acetaminophen-caffeine 250-250-65 MG tablet Commonly known as: EXCEDRIN MIGRAINE Take 1-2 tablets by mouth every 6 (six) hours as needed for headache.   b complex-vitamin c-folic acid 0.8 MG Tabs tablet Take 1 tablet by mouth daily.   cinacalcet 60 MG tablet Commonly known as: SENSIPAR Take 60 mg by mouth daily before lunch.   Entresto 24-26 MG Generic drug: sacubitril-valsartan Take 1 tablet by mouth 2 (two) times daily.   loperamide 2 MG capsule Commonly known as: IMODIUM Take 1 capsule (2  mg total) by mouth 2 (two) times daily as needed for diarrhea or loose stools.   metoprolol succinate 50 MG 24 hr tablet Commonly known as: TOPROL-XL Take 1 tablet (50 mg total) by mouth daily. Take with or immediately following a meal.   pantoprazole 40 MG tablet Commonly known as:  PROTONIX Take 1 tablet (40 mg total) by mouth daily.   sertraline 100 MG tablet Commonly known as: ZOLOFT Take 1.5 tablets (150 mg total) by mouth daily.   sevelamer carbonate 800 MG tablet Commonly known as: RENVELA Take 800-2,400 mg by mouth See admin instructions. Take 3 tablets (2400mg ) by mouth three times daily before meals and take 1 tablet (800mg ) by mouth daily before a snack   warfarin 5 MG tablet Commonly known as: COUMADIN Take 1-1.5 tablets (5-7.5 mg total) by mouth See admin instructions. Take 1 tablet (5mg ) by mouth every Monday, Wednesday, Friday, Saturday and Sunday evening and take 1.5  tablets (7.5mg ) by mouth every Tuesday and Thursday evening   zolpidem 10 MG tablet Commonly known as: AMBIEN Take 1 tablet (10 mg total) by mouth at bedtime.        Follow-up Information     Murlean Iba, MD Follow up in 1 week(s).   Specialty: Nephrology Contact information: [place] [address] [phone]                 Signed: Sharen Hones 10/26/2020, 10:25 AM

## 2020-10-26 NOTE — Consult Note (Signed)
ANTICOAGULATION CONSULT NOTE - Initial Consult  Pharmacy Consult for warfarin Indication: DVT  Allergies  Allergen Reactions   Gabapentin Other (See Comments)    Seizure   Adhesive [Tape] Itching    Silk tape is ok to use.    Patient Measurements: Height: 5\' 5"  (165.1 cm) Weight:  (unable to weigh patient due to safety. Patient on a stretcher.) IBW/kg (Calculated) : 57  Vital Signs: Temp: 98.5 F (36.9 C) (10/20 0414) Temp Source: Oral (10/20 0414) BP: 140/91 (10/20 0414) Pulse Rate: 92 (10/20 0414)  Labs: Recent Labs    10/24/20 1747 10/25/20 0007 10/25/20 0523  HGB 12.4  --   --   HCT 36.0  --   --   PLT 131*  --   --   LABPROT  --   --  32.9*  INR  --   --  3.2*  CREATININE 9.78*  --   --   TROPONINIHS 21* 27*  --      Estimated Creatinine Clearance: 4.9 mL/min (A) (by C-G formula based on SCr of 9.78 mg/dL (H)).   Medical History: Past Medical History:  Diagnosis Date   Anemia    Anxiety    Breast cancer ([place]) 01/2016   bilateral   CHF (congestive heart failure) (HCC)    Chronic kidney disease    Depression    Dialysis patient [facility])    DVT (deep venous thrombosis) (HCC)    left leg   DVT (deep venous thrombosis) ([place]) 1985   right thigh   Dysrhythmia    Gout    Headache    HTN (hypertension)    Hypertension    Parathyroid abnormality (HCC)    Parathyroid disease ([place])    Pneumonia 12/2015   Psoriasis    Renal insufficiency    Sickle cell trait (HCC)    traits    Medications:  Scheduled:   Chlorhexidine Gluconate Cloth  6 each Topical Q0600   cinacalcet  60 mg Oral QAC lunch   feeding supplement  237 mL Oral BID BM   metoprolol succinate  50 mg Oral Daily   multivitamin  1 tablet Oral QHS   pantoprazole  40 mg Oral Daily   sacubitril-valsartan  1 tablet Oral BID   sertraline  150 mg Oral Daily   sevelamer carbonate  2,400 mg Oral TID WC   Warfarin - Pharmacist Dosing Inpatient   Does not apply q1600   zolpidem  5 mg Oral QHS     Assessment: 65yo F w/ PMH of breast cancer, systolic heart failure, DVT in 2014 on warfarin, HTN, ESRD on HD, hx of seizures, and depression who was admitted to hospital for emesis. Pharmacy was consulted for warfarin dosing.   Home warfarin regimen: 5mg  MWFSS and 7.5mg  Tues/Thurs (40mg /week). Last dose was 10/18  Drug interactions: Sertraline (increases bleeding risk), remdesivir (increases INR), azithromycin (increases INR, completed Zpak ~10/17)  Date INR Dose 10/19 3.2 3mg  10/20 2.8   Goal of Therapy:  INR 2-3 Monitor platelets by anticoagulation protocol: Yes   Plan:  INR therapeutic. Will give warfarin 4mg  tonight Check INR daily  Monitor CBC per protocol    Sherilyn Banker, PharmD Clinical Pharmacist 10/26/2020 7:22 AM

## 2020-10-26 NOTE — Evaluation (Signed)
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 Pt presented to ED d/t vomiting and diarrhea. Pt adm for COVID.   Clinical Impression   Pt seen for OT evaluation this date in setting of acute hospitalization d/t COVID-19. Pt presents this date with decreased fxl activity tolerance, but overall appears close to her functional baseline. OT engages pt in sup to sit with no assist, pt just requiring increased time. Pt demos G sitting balance. She is able to CTS with good control and no use of AD. She does demo just mild sway initially, but self-corrects and is able to perform fxl mobility in room for fxl HH distances with no assistance and no AD. Pt transferred to chair end of session with SBA to SUPV. She is able to perform all seated LB ADLs with MOD I (increased time). She is left with all needs met and in reach. Left with no chair alarm as she is MOD I and requires no assistance/demos GOOD balance. No further OT needs detected at this time, will complete order.      Recommendations for follow up therapy are one component of a multi-disciplinary discharge planning process, led by the attending physician.  Recommendations may be updated based on patient status, additional functional criteria and insurance authorization.   Follow Up Recommendations  No OT follow up    Equipment Recommendations  None recommended by OT    Recommendations for Other Services       Precautions / Restrictions Precautions Precautions: Fall Restrictions Weight Bearing Restrictions: No      Mobility Bed Mobility Overal bed mobility: Independent                  Transfers Overall transfer level: Modified independent               General transfer comment: increased time, but good  control, no AD, no physical assist    Balance Overall balance assessment: Mild deficits observed, not formally tested                                         ADL either performed or assessed with clinical judgement   ADL Overall ADL's : Modified independent;At baseline                                             Vision Patient Visual Report: No change from baseline       Perception     Praxis      Pertinent Vitals/Pain Pain Assessment: No/denies pain     Hand Dominance Left   Extremity/Trunk Assessment Upper Extremity Assessment Upper Extremity Assessment: Overall WFL for tasks assessed   Lower Extremity Assessment Lower Extremity Assessment: Overall WFL for tasks assessed       Communication Communication Communication: No difficulties   Cognition Arousal/Alertness: Awake/alert Behavior During Therapy: WFL for tasks assessed/performed Overall Cognitive Status: Within Functional Limits for tasks assessed  General Comments  light retropulsion on initail CTS, but no LOB and able to self-correct, able to tolerate challenge.    Exercises Other Exercises Other Exercises: OT ed with pt re: seated core therex to improve balance. Pt demos good understanding.   Shoulder Instructions      Home Living Family/patient expects to be discharged to:: Private residence Living Arrangements: Spouse/significant other Available Help at Discharge: Family Type of Home: House       Home Layout: One level               Home Equipment: Walker - 2 wheels          Prior Functioning/Environment Level of Independence: Independent        Comments: drives and performs all ADLs/IADLs.        OT Problem List: Decreased activity tolerance      OT Treatment/Interventions: Self-care/ADL training    OT Goals(Current goals can be found in the care plan section) Acute Rehab OT  Goals Patient Stated Goal: to go home OT Goal Formulation: All assessment and education complete, DC therapy  OT Frequency:     Barriers to D/C:            Co-evaluation              AM-PAC OT "6 Clicks" Daily Activity     Outcome Measure Help from another person eating meals?: None Help from another person taking care of personal grooming?: None Help from another person toileting, which includes using toliet, bedpan, or urinal?: None Help from another person bathing (including washing, rinsing, drying)?: None Help from another person to put on and taking off regular upper body clothing?: None Help from another person to put on and taking off regular lower body clothing?: None 6 Click Score: 24   End of Session Equipment Utilized During Treatment: Gait belt Nurse Communication: Mobility status  Activity Tolerance: Patient tolerated treatment well Patient left: in chair;with call bell/phone within reach  OT Visit Diagnosis: Muscle weakness (generalized) (M62.81)                Time: 6440-3474 OT Time Calculation (min): 23 min Charges:  OT General Charges $OT Visit: 1 Visit OT Evaluation $OT Eval Low Complexity: 1 Low OT Treatments $Self Care/Home Management : 8-22 mins  Gerrianne Scale, [place], OTR/L ascom [phone] 10/26/20, 1:55 PM

## 2020-10-26 NOTE — Care Management CC44 (Signed)
Condition Code 44 Documentation Completed  Patient Details  Name: Teresa Cook MRN: [id number] Date of Birth: [birth year]   Condition Code 44 given:  Yes Patient signature on Condition Code 44 notice:  Yes Documentation of 2 MD's agreement:  Yes Code 44 added to claim:  Yes    Beverly Sessions, RN 10/26/2020, 12:02 PM

## 2020-10-26 NOTE — Progress Notes (Signed)
[facility]  ROUNDING NOTE   Subjective:   Teresa Cook is a 65 year old African-American female with past medical history of anemia, CHF, hypertension, parathyroid disorder, end-stage renal disease on hemodialysis.  Patient presents to the ED with complaints of nausea, vomiting, diarrhea and generalized weakness for 1 week.  She has been admitted for Weakness generalized [R53.1] Generalized weakness [R53.1] Nausea vomiting and diarrhea [R11.2, R19.7] COVID-19 virus infection [U07.1] COVID-19 [U07.1] COVID [U07.1]  Patient is known to our clinic and receives outpatient dialysis treatments at [facility], supervised by Dr. Candiss Norse.  She reports no missed treatments during this illness.  Last dialysis treatment being Monday. Patient found to be Covid positive.  Patient resting in bed Main concern of congestion Weaned to room air   Objective:  Vital signs in last 24 hours:  Temp:  [98.4 F (36.9 C)-99.4 F (37.4 C)] 98.4 F (36.9 C) (10/20 0836) Pulse Rate:  [73-92] 81 (10/20 0836) Resp:  [16-23] 16 (10/20 0836) BP: (132-208)/(89-110) 136/99 (10/20 0836) SpO2:  [96 %-100 %] 99 % (10/20 0836)  Weight change:  Filed Weights   10/24/20 1744  Weight: 54.4 kg    Intake/Output: I/O last 3 completed shifts: In: 100 [IV Piggyback:100] Out: 0    Intake/Output this shift:  No intake/output data recorded.  Physical Exam: General: NAD  Head: Normocephalic, atraumatic. Moist oral mucosal membranes  Eyes: Anicteric  Lungs:  Clear to auscultation, normal effort  Heart: Regular rate and rhythm  Abdomen:  Soft, nontender  Extremities: No peripheral edema.  Neurologic: Alert, oriented, moving all four extremities  Skin: No lesions  Access: Left IJ PermCath    Basic Metabolic Panel: Recent Labs  Lab 10/24/20 1747 10/26/20 0841  NA 137 135  K 4.2 3.9  CL 98 93*  CO2 25 29  GLUCOSE 93 98  BUN 42* 39*  CREATININE 9.78* 8.00*  CALCIUM 8.9 9.0   PHOS  --  5.8*     Liver Function Tests: Recent Labs  Lab 10/24/20 1747 10/26/20 0841  AST 25 23  ALT 20 18  ALKPHOS 72 61  BILITOT 0.9 0.7  PROT 7.1 6.5  ALBUMIN 3.7 3.4*    Recent Labs  Lab 10/24/20 1747  LIPASE 44    No results for input(s): AMMONIA in the last 168 hours.  CBC: Recent Labs  Lab 10/24/20 1747 10/26/20 0841  WBC 3.5* 3.4*  HGB 12.4 11.7*  HCT 36.0 34.8*  MCV 88.2 85.7  PLT 131* 165     Cardiac Enzymes: No results for input(s): CKTOTAL, CKMB, CKMBINDEX, TROPONINI in the last 168 hours.  BNP: Invalid input(s): POCBNP  CBG: No results for input(s): GLUCAP in the last 168 hours.  Microbiology: Results for orders placed or performed during the hospital encounter of 10/25/20  Resp Panel by RT-PCR (Flu A&B, Covid) Nasopharyngeal Swab     Status: Abnormal   Collection Time: 10/25/20  3:10 AM   Specimen: Nasopharyngeal Swab; Nasopharyngeal(NP) swabs in vial transport medium  Result Value Ref Range Status   SARS Coronavirus 2 by RT PCR POSITIVE (A) NEGATIVE Final    Comment: RESULT CALLED TO, READ BACK BY AND VERIFIED WITH: TIFFANY DEAN RN 0502 10/25/20 HNM (NOTE) SARS-CoV-2 target nucleic acids are DETECTED.  The SARS-CoV-2 RNA is generally detectable in upper respiratory specimens during the acute phase of infection. Positive results are indicative of the presence of the identified virus, but do not rule out bacterial infection or co-infection with other pathogens not detected by  the test. Clinical correlation with patient history and other diagnostic information is necessary to determine patient infection status. The expected result is Negative.  Fact Sheet for Patients: EntrepreneurPulse.com.au  Fact Sheet for Healthcare Providers: IncredibleEmployment.be  This test is not yet approved or cleared by the [place] FDA and  has been authorized for detection and/or diagnosis of SARS-CoV-2  by FDA under an Emergency Use Authorization (EUA).  This EUA will remain in effect (meaning this test can be  used) for the duration of  the COVID-19 declaration under Section 564(b)(1) of the Act, 21 U.S.C. section 360bbb-3(b)(1), unless the authorization is terminated or revoked sooner.     Influenza A by PCR NEGATIVE NEGATIVE Final   Influenza B by PCR NEGATIVE NEGATIVE Final    Comment: (NOTE) The Xpert Xpress SARS-CoV-2/FLU/RSV plus assay is intended as an aid in the diagnosis of influenza from Nasopharyngeal swab specimens and should not be used as a sole basis for treatment. Nasal washings and aspirates are unacceptable for Xpert Xpress SARS-CoV-2/FLU/RSV testing.  Fact Sheet for Patients: EntrepreneurPulse.com.au  Fact Sheet for Healthcare Providers: IncredibleEmployment.be  This test is not yet approved or cleared by the [place] FDA and has been authorized for detection and/or diagnosis of SARS-CoV-2 by FDA under an Emergency Use Authorization (EUA). This EUA will remain in effect (meaning this test can be used) for the duration of the COVID-19 declaration under Section 564(b)(1) of the Act, 21 U.S.C. section 360bbb-3(b)(1), unless the authorization is terminated or revoked.  Performed at [facility], [place], [place] [address]   C Difficile Quick Screen w PCR reflex     Status: None   Collection Time: 10/25/20  3:10 AM   Specimen: Stool  Result Value Ref Range Status   C Diff antigen NEGATIVE NEGATIVE Final   C Diff toxin NEGATIVE NEGATIVE Final   C Diff interpretation No C. difficile detected.  Final    Comment: Performed at [facility], [address] [place], [place] [address]  Gastrointestinal Panel by PCR , Stool     Status: None   Collection Time: 10/25/20  3:10 AM   Specimen: Stool  Result Value Ref Range Status   Campylobacter species NOT DETECTED NOT DETECTED Final    Plesimonas shigelloides NOT DETECTED NOT DETECTED Final   Salmonella species NOT DETECTED NOT DETECTED Final   Yersinia enterocolitica NOT DETECTED NOT DETECTED Final   Vibrio species NOT DETECTED NOT DETECTED Final   Vibrio cholerae NOT DETECTED NOT DETECTED Final   Enteroaggregative E coli (EAEC) NOT DETECTED NOT DETECTED Final   Enteropathogenic E coli (EPEC) NOT DETECTED NOT DETECTED Final   Enterotoxigenic E coli (ETEC) NOT DETECTED NOT DETECTED Final   Shiga like toxin producing E coli (STEC) NOT DETECTED NOT DETECTED Final   Shigella/Enteroinvasive E coli (EIEC) NOT DETECTED NOT DETECTED Final   Cryptosporidium NOT DETECTED NOT DETECTED Final   Cyclospora cayetanensis NOT DETECTED NOT DETECTED Final   Entamoeba histolytica NOT DETECTED NOT DETECTED Final   Giardia lamblia NOT DETECTED NOT DETECTED Final   Adenovirus F40/41 NOT DETECTED NOT DETECTED Final   Astrovirus NOT DETECTED NOT DETECTED Final   Norovirus GI/GII NOT DETECTED NOT DETECTED Final   Rotavirus A NOT DETECTED NOT DETECTED Final   Sapovirus (I, II, IV, and V) NOT DETECTED NOT DETECTED Final    Comment: Performed at [facility], [address], [place], [place] [address]    Coagulation Studies: Recent Labs  10/25/20 0523 10/26/20 0841  LABPROT 32.9* 29.5*  INR 3.2* 2.8*     Urinalysis: No results for input(s): COLORURINE, LABSPEC, PHURINE, GLUCOSEU, HGBUR, BILIRUBINUR, KETONESUR, PROTEINUR, UROBILINOGEN, NITRITE, LEUKOCYTESUR in the last 72 hours.  Invalid input(s): APPERANCEUR    Imaging: CT Abdomen Pelvis Wo Contrast  Result Date: 10/25/2020 CLINICAL DATA:  Nausea, vomiting, diarrhea, weakness EXAM: CT ABDOMEN AND PELVIS WITHOUT CONTRAST TECHNIQUE: Multidetector CT imaging of the abdomen and pelvis was performed following the standard protocol without IV contrast. COMPARISON:  03/06/2016 FINDINGS: Lower chest: Coronary and coarse aortic atheromatous calcifications. No pleural or  pericardial effusion. Hepatobiliary: No focal liver abnormality is seen. No gallstones, gallbladder wall thickening, or biliary dilatation. Pancreas: Unremarkable. No pancreatic ductal dilatation or surrounding inflammatory changes. Spleen: Normal in size without focal abnormality. Adrenals/Urinary Tract: Adrenal glands unremarkable. Bilateral renal parenchymal atrophy with innumerable cystic and hyperdense lesions, progressive since previous exam. Scattered calcifications measuring up to 5 mm on the right, 7 mm left lower pole. No hydronephrosis. Urinary bladder is nondistended. Stomach/Bowel: Stomach is decompressed. Small bowel is nondistended, unremarkable. Post appendectomy. The colon is nondilated, unremarkable. Vascular/Lymphatic: Extensive heavily calcified aortoiliac atheromatous plaque without aneurysm. No abdominal or pelvic adenopathy. Reproductive: Status post hysterectomy. No adnexal masses. Other: Bilateral pelvic phleboliths.  No ascites.  No free air. Musculoskeletal: Diffuse sclerosis throughout the lumbosacral spine as before. Degenerative disc disease L3-S1. No fracture or worrisome bone lesion. IMPRESSION: 1. No acute findings. 2. Coronary and Aortic Atherosclerosis (ICD10-170.0). 3. Progressive bilateral renal calcifications and lesions, many cystic although the hyperdense lesions are incompletely characterized on this noncontrast study. 4. Chronic osseous sclerosis likely related to renal insufficiency. Electronically Signed   By: Lucrezia Europe M.D.   On: 10/25/2020 06:12   DG Chest 2 View  Result Date: 10/25/2020 CLINICAL DATA:  Cough, vomiting, diarrhea EXAM: CHEST - 2 VIEW COMPARISON:  01/22/2020 FINDINGS: Lungs are clear.  No pleural effusion or pneumothorax. Heart is normal in size.  Thoracic aortic atherosclerosis. Left sided dual lumen dialysis catheter terminating in the upper right atrium. IMPRESSION: No evidence of acute cardiopulmonary disease. Electronically Signed   By: Julian Hy M.D.   On: 10/25/2020 02:43     Medications:    remdesivir 100 mg in NS 100 mL 100 mg (10/26/20 0900)    Chlorhexidine Gluconate Cloth  6 each Topical Q0600   cinacalcet  60 mg Oral QAC lunch   feeding supplement  237 mL Oral BID BM   metoprolol succinate  50 mg Oral Daily   multivitamin  1 tablet Oral QHS   pantoprazole  40 mg Oral Daily   sacubitril-valsartan  1 tablet Oral BID   sertraline  150 mg Oral Daily   sevelamer carbonate  2,400 mg Oral TID WC   warfarin  4 mg Oral ONCE-1600   Warfarin - Pharmacist Dosing Inpatient   Does not apply q1600   zolpidem  5 mg Oral QHS   acetaminophen, albuterol, aspirin-acetaminophen-caffeine, dextromethorphan-guaiFENesin, hydrALAZINE, loperamide, ondansetron (ZOFRAN) IV, sevelamer carbonate  Assessment/ Plan:  [name] is a 65 y.o.  female with past medical history of anemia, CHF, hypertension, parathyroid disorder, end-stage renal disease on hemodialysis.  Patient presents to the ED with complaints of nausea, vomiting, diarrhea and generalized weakness for 1 week.  She has been admitted for Weakness generalized [R53.1] Generalized weakness [R53.1] Nausea vomiting and diarrhea [R11.2, R19.7] COVID-19 virus infection [U07.1] COVID-19 [U07.1] COVID [U07.1]  [facility]/MWF/Lt Permcath/ 58.0kg  End-stage renal disease on  hemodialysis, will maintain outpatient schedule, if possible.  Received dialysis yesterday, tolerated well. Next treatment scheduled for Friday.   2. Anemia of chronic kidney disease  Lab Results  Component Value Date   HGB 11.7 (L) 10/26/2020  Hemoglobin at goal  3. Secondary Hyperparathyroidism:  Lab Results  Component Value Date   PTH 344 (H) 01/23/2020   CALCIUM 9.0 10/26/2020   PHOS 5.8 (H) 10/26/2020  Calcium at goal, phosphorus elevated   4.  Hypertension with chronic kidney disease.  Home regimen includes metoprolol.  Currently receiving metoprolol and Entresto. Clonidine  given during dialysis yesterday for elevated BP during treatment. BP 136/99  5.  Chronic systolic heart failure.  Echo from 01/23/20 shows EF 40 to 45%  6.  COVID-19 positive Remdesivir given   LOS: 1 Deaunna Olarte 10/20/20222:21 PM

## 2020-10-26 NOTE — Plan of Care (Signed)

## 2020-10-26 NOTE — Care Management Obs Status (Signed)
[place] NOTIFICATION   Patient Details  Name: Teresa Cook MRN: [id number] Date of Birth: 1955-03-21   Medicare Observation Status Notification Given:  Yes    Beverly Sessions, RN 10/26/2020, 12:02 PM

## 2020-10-26 NOTE — Progress Notes (Signed)
Patient refused her lab work this morning but states she is willing to let them draw labs after she talks to a doctor and gets all her results explained to her so far.

## 2020-10-26 NOTE — Progress Notes (Signed)
AVS reviewed and pt verbalized understanding of all discharge instructions. NAD noted or voiced concerns at this time.

## 2020-10-27 LAB — TYPE AND SCREEN
ABO/RH(D): O POS
Antibody Screen: POSITIVE
Unit division: 0
Unit division: 0

## 2020-10-27 LAB — BPAM RBC
Blood Product Expiration Date: 202211162359
Blood Product Expiration Date: 202211162359
Unit Type and Rh: 5100
Unit Type and Rh: 5100

## 2020-12-07 NOTE — Progress Notes (Signed)
Good morning,  This patient was having diarrhea at the time of her ED visit.  Thanks, Lurline Hare

## 2021-01-25 ENCOUNTER — Telehealth: Payer: Self-pay

## 2021-01-25 NOTE — Telephone Encounter (Signed)
Patient would like to know if she can get generic for [facility] . [facility]. 30 day supply

## 2021-01-25 NOTE — Telephone Encounter (Signed)
Spoke with the patient. Advise the patient that Delene Loll is not available as a generic and is brand name only.  Patient sts that she could continue to pay for [facility] for a couple of months but not long term.  Advised the patient that she could apply for patient assistance and if approved she would get the medication at no cost. Patient sts that she would like to apply. Adv the patient that I will mail her the application. She should complete her portion and attach the required documentation and drop it off to our office to be faxed.  Patient verbalized understanding and voiced appreciation for the assistance.

## 2021-02-14 NOTE — Telephone Encounter (Signed)
This is a Dr. Garen Lah patient. Will fwd to his nurse Coleen, RN to f/u.

## 2021-02-14 NOTE — Telephone Encounter (Signed)
Patient calling to let us know she has mailed application income and insurance information to Patient assistance address located on form she received .   Patient notified of below that she was to bring to office for provider completion and submission.    Now she doesn't know where her information has gone and what to do now.  Patient doesn't have the number to call and check on status of application receipt.

## 2021-02-15 MED ORDER — ENTRESTO 24-26 MG PO TABS: 1.0000 | ORAL_TABLET | Freq: Two times a day (BID) | ORAL | 3 refills | Status: AC

## 2021-02-15 NOTE — Telephone Encounter (Signed)
Spoke with patient and informed her that I will fax out portion of the PAF to [employer] and call them early next week to see if they have received both of our portions. Patient stated that she mailed it in around Monday 02/05/21.  Patient was very grateful for the call back .

## 2021-04-03 ENCOUNTER — Encounter: Payer: Self-pay | Admitting: Cardiology

## 2021-04-03 NOTE — Telephone Encounter (Signed)
Patient states she is still awaiting the tax form for her Delene Loll assistance

## 2021-04-03 NOTE — Telephone Encounter (Signed)
This encounter was created in error - please disregard.

## 2021-05-25 ENCOUNTER — Other Ambulatory Visit
Admission: RE | Admit: 2021-05-25 | Discharge: 2021-05-25 | Disposition: A | Discharge status: Home / Self Care | Payer: Medicare Other | Source: Ambulatory Visit | Attending: Internal Medicine | Admitting: Internal Medicine

## 2021-05-25 DIAGNOSIS — I4891 Unspecified atrial fibrillation: Secondary | ICD-10-CM | POA: Diagnosis present

## 2021-05-25 LAB — PROTIME-INR
INR: 2.9 — ABNORMAL HIGH (ref 0.8–1.2)
Prothrombin Time: 29.9 seconds — ABNORMAL HIGH (ref 11.4–15.2)

## 2021-06-29 ENCOUNTER — Other Ambulatory Visit
Admission: RE | Admit: 2021-06-29 | Discharge: 2021-06-29 | Disposition: A | Discharge status: Home / Self Care | Payer: Medicare Other | Source: Ambulatory Visit | Attending: Internal Medicine | Admitting: Internal Medicine

## 2021-06-29 DIAGNOSIS — N186 End stage renal disease: Secondary | ICD-10-CM | POA: Diagnosis present

## 2021-06-29 LAB — PROTIME-INR
INR: 2.3 — ABNORMAL HIGH (ref 0.8–1.2)
Prothrombin Time: 25.3 seconds — ABNORMAL HIGH (ref 11.4–15.2)

## 2021-07-08 ENCOUNTER — Emergency Department: Payer: Medicare Other

## 2021-07-08 ENCOUNTER — Inpatient Hospital Stay
Admission: EM | Admit: 2021-07-08 | Discharge: 2021-07-10 | DRG: 640 | Disposition: A | Discharge status: Home / Self Care | Payer: Medicare Other | Attending: Internal Medicine | Admitting: Internal Medicine

## 2021-07-08 ENCOUNTER — Other Ambulatory Visit: Payer: Self-pay

## 2021-07-08 DIAGNOSIS — E8770 Fluid overload, unspecified: Secondary | ICD-10-CM | POA: Diagnosis not present

## 2021-07-08 DIAGNOSIS — E215 Disorder of parathyroid gland, unspecified: Secondary | ICD-10-CM | POA: Diagnosis present

## 2021-07-08 DIAGNOSIS — Z833 Family history of diabetes mellitus: Secondary | ICD-10-CM

## 2021-07-08 DIAGNOSIS — I5022 Chronic systolic (congestive) heart failure: Secondary | ICD-10-CM | POA: Diagnosis present

## 2021-07-08 DIAGNOSIS — I10 Essential (primary) hypertension: Secondary | ICD-10-CM | POA: Diagnosis present

## 2021-07-08 DIAGNOSIS — R519 Headache, unspecified: Secondary | ICD-10-CM | POA: Diagnosis present

## 2021-07-08 DIAGNOSIS — Z20822 Contact with and (suspected) exposure to covid-19: Secondary | ICD-10-CM | POA: Diagnosis present

## 2021-07-08 DIAGNOSIS — J810 Acute pulmonary edema: Secondary | ICD-10-CM

## 2021-07-08 DIAGNOSIS — D573 Sickle-cell trait: Secondary | ICD-10-CM | POA: Diagnosis present

## 2021-07-08 DIAGNOSIS — E876 Hypokalemia: Secondary | ICD-10-CM | POA: Diagnosis present

## 2021-07-08 DIAGNOSIS — Z823 Family history of stroke: Secondary | ICD-10-CM

## 2021-07-08 DIAGNOSIS — I132 Hypertensive heart and chronic kidney disease with heart failure and with stage 5 chronic kidney disease, or end stage renal disease: Secondary | ICD-10-CM | POA: Diagnosis present

## 2021-07-08 DIAGNOSIS — I82409 Acute embolism and thrombosis of unspecified deep veins of unspecified lower extremity: Secondary | ICD-10-CM | POA: Diagnosis present

## 2021-07-08 DIAGNOSIS — N186 End stage renal disease: Secondary | ICD-10-CM | POA: Diagnosis not present

## 2021-07-08 DIAGNOSIS — Z853 Personal history of malignant neoplasm of breast: Secondary | ICD-10-CM

## 2021-07-08 DIAGNOSIS — G4700 Insomnia, unspecified: Secondary | ICD-10-CM | POA: Diagnosis present

## 2021-07-08 DIAGNOSIS — Z992 Dependence on renal dialysis: Secondary | ICD-10-CM

## 2021-07-08 DIAGNOSIS — Z90711 Acquired absence of uterus with remaining cervical stump: Secondary | ICD-10-CM

## 2021-07-08 DIAGNOSIS — I248 Other forms of acute ischemic heart disease: Secondary | ICD-10-CM | POA: Diagnosis present

## 2021-07-08 DIAGNOSIS — N2581 Secondary hyperparathyroidism of renal origin: Secondary | ICD-10-CM | POA: Diagnosis present

## 2021-07-08 DIAGNOSIS — D320 Benign neoplasm of cerebral meninges: Secondary | ICD-10-CM | POA: Diagnosis present

## 2021-07-08 DIAGNOSIS — Z8249 Family history of ischemic heart disease and other diseases of the circulatory system: Secondary | ICD-10-CM

## 2021-07-08 DIAGNOSIS — Z7901 Long term (current) use of anticoagulants: Secondary | ICD-10-CM

## 2021-07-08 DIAGNOSIS — Z79899 Other long term (current) drug therapy: Secondary | ICD-10-CM

## 2021-07-08 DIAGNOSIS — Z9013 Acquired absence of bilateral breasts and nipples: Secondary | ICD-10-CM

## 2021-07-08 DIAGNOSIS — D631 Anemia in chronic kidney disease: Secondary | ICD-10-CM | POA: Diagnosis present

## 2021-07-08 DIAGNOSIS — M109 Gout, unspecified: Secondary | ICD-10-CM | POA: Diagnosis present

## 2021-07-08 DIAGNOSIS — Z86718 Personal history of other venous thrombosis and embolism: Secondary | ICD-10-CM

## 2021-07-08 DIAGNOSIS — F418 Other specified anxiety disorders: Secondary | ICD-10-CM | POA: Diagnosis present

## 2021-07-08 DIAGNOSIS — I5A Non-ischemic myocardial injury (non-traumatic): Secondary | ICD-10-CM | POA: Diagnosis present

## 2021-07-08 DIAGNOSIS — Z803 Family history of malignant neoplasm of breast: Secondary | ICD-10-CM

## 2021-07-08 LAB — PROTIME-INR
INR: 1.8 — ABNORMAL HIGH (ref 0.8–1.2)
Prothrombin Time: 21 seconds — ABNORMAL HIGH (ref 11.4–15.2)

## 2021-07-08 LAB — CBC
HCT: 33.2 % — ABNORMAL LOW (ref 36.0–46.0)
Hemoglobin: 10.6 g/dL — ABNORMAL LOW (ref 12.0–15.0)
MCH: 27.6 pg (ref 26.0–34.0)
MCHC: 31.9 g/dL (ref 30.0–36.0)
MCV: 86.5 fL (ref 80.0–100.0)
Platelets: 200 10*3/uL (ref 150–400)
RBC: 3.84 MIL/uL — ABNORMAL LOW (ref 3.87–5.11)
RDW: 18.3 % — ABNORMAL HIGH (ref 11.5–15.5)
WBC: 8.3 10*3/uL (ref 4.0–10.5)
nRBC: 0 % (ref 0.0–0.2)

## 2021-07-08 LAB — TROPONIN I (HIGH SENSITIVITY)
Troponin I (High Sensitivity): 37 ng/L — ABNORMAL HIGH (ref <18)
Troponin I (High Sensitivity): 43 ng/L — ABNORMAL HIGH (ref <18)
Troponin I (High Sensitivity): 60 ng/L — ABNORMAL HIGH (ref <18)

## 2021-07-08 LAB — COMPREHENSIVE METABOLIC PANEL
ALT: 23 U/L (ref 0–44)
AST: 27 U/L (ref 15–41)
Albumin: 3.9 g/dL (ref 3.5–5.0)
Alkaline Phosphatase: 66 U/L (ref 38–126)
Anion gap: 14 (ref 5–15)
BUN: 44 mg/dL — ABNORMAL HIGH (ref 8–23)
CO2: 28 mmol/L (ref 22–32)
Calcium: 11.5 mg/dL — ABNORMAL HIGH (ref 8.9–10.3)
Chloride: 101 mmol/L (ref 98–111)
Creatinine, Ser: 9.2 mg/dL — ABNORMAL HIGH (ref 0.44–1.00)
GFR, Estimated: 4 mL/min — ABNORMAL LOW (ref >60)
Glucose, Bld: 120 mg/dL — ABNORMAL HIGH (ref 70–99)
Potassium: 3.4 mmol/L — ABNORMAL LOW (ref 3.5–5.1)
Sodium: 143 mmol/L (ref 135–145)
Total Bilirubin: 0.7 mg/dL (ref 0.3–1.2)
Total Protein: 7.4 g/dL (ref 6.5–8.1)

## 2021-07-08 LAB — MAGNESIUM: Magnesium: 2.3 mg/dL (ref 1.7–2.4)

## 2021-07-08 LAB — SARS CORONAVIRUS 2 BY RT PCR: SARS Coronavirus 2 by RT PCR: NEGATIVE

## 2021-07-08 MED ORDER — ACETAMINOPHEN-CAFFEINE 500-65 MG PO TABS
1.0000 | ORAL_TABLET | Freq: Four times a day (QID) | ORAL | Status: DC | PRN
  Administered 2021-07-08 – 2021-07-09 (×2): 2 via ORAL
  Filled 2021-07-08 (×4): qty 2

## 2021-07-08 MED ORDER — HYDRALAZINE HCL 20 MG/ML IJ SOLN
10.0000 mg | INTRAMUSCULAR | Status: DC | PRN
  Administered 2021-07-08 (×2): 10 mg via INTRAVENOUS
  Filled 2021-07-08 (×2): qty 1

## 2021-07-08 MED ORDER — ALBUTEROL SULFATE HFA 108 (90 BASE) MCG/ACT IN AERS: 2.0000 | INHALATION_SPRAY | RESPIRATORY_TRACT | Status: DC | PRN

## 2021-07-08 MED ORDER — SACUBITRIL-VALSARTAN 24-26 MG PO TABS
1.0000 | ORAL_TABLET | Freq: Two times a day (BID) | ORAL | Status: DC
  Administered 2021-07-09 – 2021-07-10 (×3): 1 via ORAL
  Filled 2021-07-08 (×4): qty 1

## 2021-07-08 MED ORDER — DEXAMETHASONE 4 MG PO TABS
4.0000 mg | ORAL_TABLET | Freq: Three times a day (TID) | ORAL | Status: DC
  Administered 2021-07-08 – 2021-07-10 (×6): 4 mg via ORAL
  Filled 2021-07-08 (×6): qty 1

## 2021-07-08 MED ORDER — ONDANSETRON HCL 4 MG/2ML IJ SOLN
4.0000 mg | Freq: Three times a day (TID) | INTRAMUSCULAR | Status: DC | PRN
  Administered 2021-07-08: 4 mg via INTRAVENOUS
  Filled 2021-07-08 (×2): qty 2

## 2021-07-08 MED ORDER — NITROGLYCERIN 2 % TD OINT
1.0000 [in_us] | TOPICAL_OINTMENT | Freq: Once | TRANSDERMAL | Status: AC
  Administered 2021-07-08: 1 [in_us] via TOPICAL
  Filled 2021-07-08: qty 1

## 2021-07-08 MED ORDER — ALBUTEROL SULFATE (2.5 MG/3ML) 0.083% IN NEBU
2.5000 mg | INHALATION_SOLUTION | RESPIRATORY_TRACT | Status: DC | PRN
Start: 2021-07-08 — End: 2021-07-10

## 2021-07-08 MED ORDER — ZOLPIDEM TARTRATE 5 MG PO TABS
5.0000 mg | ORAL_TABLET | Freq: Every day | ORAL | Status: DC
  Administered 2021-07-08 – 2021-07-09 (×2): 5 mg via ORAL
  Filled 2021-07-08 (×2): qty 1

## 2021-07-08 MED ORDER — WARFARIN SODIUM 7.5 MG PO TABS
7.5000 mg | ORAL_TABLET | Freq: Once | ORAL | Status: AC
  Administered 2021-07-08: 7.5 mg via ORAL
  Filled 2021-07-08: qty 1

## 2021-07-08 MED ORDER — SERTRALINE HCL 50 MG PO TABS
150.0000 mg | ORAL_TABLET | Freq: Every day | ORAL | Status: DC
  Administered 2021-07-09 – 2021-07-10 (×2): 150 mg via ORAL
  Filled 2021-07-08 (×3): qty 3

## 2021-07-08 MED ORDER — KETOROLAC TROMETHAMINE 30 MG/ML IJ SOLN
15.0000 mg | Freq: Once | INTRAMUSCULAR | Status: DC
  Filled 2021-07-08: qty 1

## 2021-07-08 MED ORDER — SODIUM CHLORIDE 0.9 % IV BOLUS: 1000.0000 mL | Freq: Once | INTRAVENOUS | Status: DC

## 2021-07-08 MED ORDER — LORAZEPAM 0.5 MG PO TABS
0.5000 mg | ORAL_TABLET | Freq: Every day | ORAL | Status: DC
  Administered 2021-07-08 – 2021-07-09 (×2): 0.5 mg via ORAL
  Filled 2021-07-08 (×2): qty 1

## 2021-07-08 MED ORDER — METOPROLOL SUCCINATE ER 50 MG PO TB24
50.0000 mg | ORAL_TABLET | Freq: Every day | ORAL | Status: DC
  Administered 2021-07-09 – 2021-07-10 (×2): 50 mg via ORAL
  Filled 2021-07-08 (×3): qty 1

## 2021-07-08 MED ORDER — ASPIRIN 81 MG PO TBEC
81.0000 mg | DELAYED_RELEASE_TABLET | Freq: Every day | ORAL | Status: DC
  Administered 2021-07-08: 81 mg via ORAL
  Filled 2021-07-08 (×3): qty 1

## 2021-07-08 MED ORDER — DIPHENHYDRAMINE HCL 50 MG/ML IJ SOLN
50.0000 mg | Freq: Once | INTRAMUSCULAR | Status: DC
  Filled 2021-07-08: qty 1

## 2021-07-08 MED ORDER — WARFARIN - PHARMACIST DOSING INPATIENT
Freq: Every day | Status: DC
  Filled 2021-07-08: qty 1

## 2021-07-08 MED ORDER — ACETAMINOPHEN 325 MG PO TABS
650.0000 mg | ORAL_TABLET | Freq: Four times a day (QID) | ORAL | Status: DC | PRN
  Filled 2021-07-08: qty 2

## 2021-07-08 MED ORDER — CLONIDINE HCL 0.1 MG PO TABS
0.1000 mg | ORAL_TABLET | Freq: Once | ORAL | Status: DC
  Filled 2021-07-08: qty 1

## 2021-07-08 MED ORDER — SEVELAMER CARBONATE 800 MG PO TABS
2400.0000 mg | ORAL_TABLET | Freq: Three times a day (TID) | ORAL | Status: DC
  Administered 2021-07-08 – 2021-07-10 (×6): 2400 mg via ORAL
  Filled 2021-07-08 (×6): qty 3

## 2021-07-08 MED ORDER — CINACALCET HCL 30 MG PO TABS
60.0000 mg | ORAL_TABLET | Freq: Every day | ORAL | Status: DC
  Administered 2021-07-10: 60 mg via ORAL
  Filled 2021-07-08 (×2): qty 2

## 2021-07-08 MED ORDER — ASPIRIN-ACETAMINOPHEN-CAFFEINE 250-250-65 MG PO TABS
1.0000 | ORAL_TABLET | Freq: Four times a day (QID) | ORAL | Status: DC | PRN
  Filled 2021-07-08: qty 2

## 2021-07-08 MED ORDER — ACETAMINOPHEN 500 MG PO TABS
1000.0000 mg | ORAL_TABLET | Freq: Once | ORAL | Status: AC
  Administered 2021-07-08: 1000 mg via ORAL
  Filled 2021-07-08: qty 2

## 2021-07-08 MED ORDER — LABETALOL HCL 5 MG/ML IV SOLN
10.0000 mg | Freq: Once | INTRAVENOUS | Status: AC
  Administered 2021-07-08: 10 mg via INTRAVENOUS
  Filled 2021-07-08: qty 4

## 2021-07-08 MED ORDER — RENA-VITE PO TABS
1.0000 | ORAL_TABLET | Freq: Every day | ORAL | Status: DC
Start: 2021-07-08 — End: 2021-07-10
  Administered 2021-07-09: 1 via ORAL
  Filled 2021-07-08 (×2): qty 1

## 2021-07-08 MED ORDER — PANTOPRAZOLE SODIUM 40 MG PO TBEC
40.0000 mg | DELAYED_RELEASE_TABLET | Freq: Every day | ORAL | Status: DC
  Administered 2021-07-08 – 2021-07-10 (×3): 40 mg via ORAL
  Filled 2021-07-08 (×3): qty 1

## 2021-07-08 MED ORDER — LOPERAMIDE HCL 2 MG PO CAPS
2.0000 mg | ORAL_CAPSULE | Freq: Two times a day (BID) | ORAL | Status: DC | PRN
  Administered 2021-07-10: 2 mg via ORAL
  Filled 2021-07-08 (×2): qty 1

## 2021-07-08 MED ORDER — DM-GUAIFENESIN ER 30-600 MG PO TB12: 1.0000 | ORAL_TABLET | Freq: Two times a day (BID) | ORAL | Status: DC | PRN

## 2021-07-08 MED ORDER — METOCLOPRAMIDE HCL 5 MG/ML IJ SOLN
10.0000 mg | Freq: Once | INTRAMUSCULAR | Status: DC
  Filled 2021-07-08: qty 2

## 2021-07-08 NOTE — Assessment & Plan Note (Signed)
Patient has history of a meningioma.  CT of head today showed increased size of meningioma with some mass effect.  Mental status normal.  No focal neurologic deficit on physical examination. -Consulted with Dr. Tamala Julian of neurosurgery -Start Decadron 4 mg every 8 hours

## 2021-07-08 NOTE — ED Provider Notes (Signed)
[facility] Provider Note    Event Date/Time   First MD Initiated Contact with Patient 07/08/21 1420     (approximate)  History   Chief Complaint: Headache  HPI  Teresa Cook is a 66 y.o. female with a past medical history of anemia, anxiety, CHF, ESRD on HD Monday/Wednesday/Friday, hypertension, prior DVT on warfarin presents to the emergency department for headache and shortness of breath.  According to the patient for the past 2 weeks she has been experiencing a near daily headache.  Currently rates her headache as an 8/10 in severity.  Patient denies any weakness or numbness of any arm or leg no confusion or difficulty speaking.  Patient also states for the past 5 or 6 days she has been feeling short of breath.  Patient currently satting around 90% on room air placed on 2 L nasal cannula.  Patient does not typically have an oxygen requirement.  Patient denies any chest pain.  Denies any leg pain or swelling.  Patient is on warfarin for prior DVT.  No pleuritic pain.  Physical Exam   Triage Vital Signs: ED Triage Vitals  Enc Vitals Group     BP 07/08/21 1410 (!) 219/103     Pulse Rate 07/08/21 1410 92     Resp 07/08/21 1410 18     Temp 07/08/21 1410 98.1 F (36.7 C)     Temp Source 07/08/21 1410 Oral     SpO2 07/08/21 1410 93 %     Weight 07/08/21 1408 120 lb (54.4 kg)     Height 07/08/21 1408 '5\' 5"'$  (1.651 m)     Head Circumference --      Peak Flow --      Pain Score 07/08/21 1407 (S) 9     Pain Loc --      Pain Edu? --      Excl. in [place]? --     Most recent vital signs: Vitals:   07/08/21 1410  BP: (!) 219/103  Pulse: 92  Resp: 18  Temp: 98.1 F (36.7 C)  SpO2: 93%    General: Awake, no distress.  CV:  Good peripheral perfusion.  Regular rate and rhythm  Resp:  Normal effort.  Equal breath sounds bilaterally.  Abd:  No distention.  Soft, nontender.  No rebound or guarding. Other:  No lower extremity edema or tenderness on my  exam   ED Results / Procedures / Treatments   EKG  EKG viewed and interpreted by myself shows a sinus rhythm at 93 bpm with a narrow QRS, left axis deviation, largely normal intervals with nonspecific ST changes.  No ST elevation.  RADIOLOGY  I have reviewed and interpreted the CT head images.  Patient appears to have calcifications in the brain however on prior CT imaging for many years ago they appear to be present as well the larger now. Radiology has read this as a meningioma that is increasing in size.   MEDICATIONS ORDERED IN ED: Medications  nitroGLYCERIN (NITROGLYN) 2 % ointment 1 inch (has no administration in time range)  labetalol (NORMODYNE) injection 10 mg (has no administration in time range)  acetaminophen (TYLENOL) tablet 1,000 mg (has no administration in time range)     IMPRESSION / MDM / ASSESSMENT AND PLAN / ED COURSE  I reviewed the triage vital signs and the nursing notes.  Patient's presentation is most consistent with acute presentation with potential threat to life or bodily function.  Patient presents to the emergency  department for 2 weeks of near daily headache as well as 1 week of shortness of breath.  Patient found to be satting around 90% on room air patient is quite hypertensive 219/103.  Patient is on dialysis.  We will dose 1 inch of nitroglycerin ointment and 10 mg of IV labetalol.  We will obtain a CT scan of the head given the significant hypertension and persistent headache to rule out small bleed.  We will check labs including cardiac enzymes and obtain a chest x-ray to evaluate for pulmonary edema/CHF or fluid overload.  We will continue to closely monitor while awaiting results.  We will also dose Tylenol for the headache.  Chest x-ray is consistent with pulmonary edema.  I spoke to Dr. Zollie Scale of nephrology given the patient's pulmonary edema requiring 2 L of oxygen we will admit to the hospital service.  They will plan to dialyze likely in the  morning.  As far as the patient's headache she does have an increase in size of her meningioma which could possibly be causing her increased headache.  We will admit to the hospital service for work-up and treatment.  Patient states her headache is improved but still mildly present.  Patient CBC is largely unchanged.  Chemistry shows a reassuringly normal potassium at 3.4.  Troponin slightly elevated although not significantly changed from historical values.  Patient will be admitted to the hospital service for ongoing work-up treatment and dialysis.  FINAL CLINICAL IMPRESSION(S) / ED DIAGNOSES   Headache Dyspnea [place] edema/fluid overload  Note:  This document was prepared using Dragon voice recognition software and may include unintentional dictation errors.   Harvest Dark, MD 07/08/21 [id number]

## 2021-07-08 NOTE — Assessment & Plan Note (Signed)
2D echo on 01/23/2020 showed EF of 40-45%.  Now has fluid overload -Volume management per renal dialysis

## 2021-07-08 NOTE — Assessment & Plan Note (Signed)
-   Continue Coumadin per pharm

## 2021-07-08 NOTE — Consult Note (Signed)
Prelim Summary: 66 year old woman with a history of a known falcine meningioma who presented with worsening headaches.  She was noted to be in a hypertensive episode with systolics up into the 268T.  She is being admitted for blood pressure control.  On her work-up was found to have a slight increase in size of her known meningioma.  Notably this is not causing any brainstem compression or obstructive hydrocephalus.  No overt excessive edema.  At this point would be unlikely to be the causative factor in her worsening headaches.  Formal note to follow.   Consulting Department:  Medicine team   Primary Physician:  Murlean Iba, MD  Chief Complaint: Hypertension  History of Present Illness: 07/08/2021 Teresa Cook is a 66 y.o. female who presents with the chief complaint of uncontrolled hypertension.  She was being admitted for hypertensive crisis.  She had her head CT which demonstrated some interval growth of her posterior fossa meningioma for which she has known about for many years.  She states that she does have a mild headache, but has not noticed any neurologic changes such as weakness numbness tingling or neurologic worsening.  She does state that she feels that she has had the shakes more recently, but does not note any correlation other than with worsening headaches.  Review of Systems:  A 10 point review of systems is negative, except for the pertinent positives and negatives detailed in the HPI.  Past Medical History: Past Medical History:  Diagnosis Date   Anemia    Anxiety    Breast cancer ([place]) 01/2016   bilateral   CHF (congestive heart failure) (HCC)    Chronic kidney disease    Depression    Dialysis patient [facility])    DVT (deep venous thrombosis) ([place])    left leg   DVT (deep venous thrombosis) ([place]) 1985   right thigh   Dysrhythmia    Gout    Headache    HTN (hypertension)    Hypertension    Parathyroid abnormality (HCC)    Parathyroid disease ([place])     Pneumonia 12/2015   Psoriasis    Renal insufficiency    Sickle cell trait ([place])    traits    Past Surgical History: Past Surgical History:  Procedure Laterality Date   ABDOMINAL HYSTERECTOMY  1980   APPENDECTOMY     BREAST BIOPSY Left 10/28/2013   benign   BREAST EXCISIONAL BIOPSY Left 2002   benign   CORONARY ANGIOGRAPHY N/A 04/04/2020   Procedure: CORONARY ANGIOGRAPHY;  Surgeon: Wellington Hampshire, MD;  Location: [place] [facility];  Service: Cardiovascular;  Laterality: N/A;   INSERTION OF DIALYSIS CATHETER  2014   LIPOMA EXCISION N/A 01/23/2016   Procedure: EXCISION LIPOMA;  Surgeon: Hubbard Robinson, MD;  Location: [facility] ORS;  Service: General;  Laterality: N/A;   MASTECTOMY W/ SENTINEL NODE BIOPSY Bilateral 01/23/2016   Procedure: bilateral MASTECTOMY WITH  bilateral SENTINEL LYMPH NODE BIOPSY possible left axillary node dissection forehead lipoma removal;  Surgeon: Hubbard Robinson, MD;  Location: [facility] ORS;  Service: General;  Laterality: Bilateral;   PARTIAL HYSTERECTOMY     PERIPHERAL VASCULAR CATHETERIZATION N/A 12/25/2015   Procedure: Dialysis/Perma Catheter Insertion;  Surgeon: Algernon Huxley, MD;  Location: [place] [facility];  Service: Cardiovascular;  Laterality: N/A;   PERIPHERAL VASCULAR CATHETERIZATION Left 01/22/2016   Procedure: Dialysis/Perma Catheter Insertion;  Surgeon: Algernon Huxley, MD;  Location: [place] [facility];  Service: Cardiovascular;  Laterality: Left;  PERIPHERAL VASCULAR CATHETERIZATION N/A 01/26/2016   Procedure: Dialysis/Perma Catheter Insertion;  Surgeon: Katha Cabal, MD;  Location: [place] [facility];  Service: Cardiovascular;  Laterality: N/A;   PORT-A-CATH REMOVAL N/A 12/20/2015   Procedure: REMOVAL PORT-A-CATH;  Surgeon: Hubbard Robinson, MD;  Location: [facility] ORS;  Service: General;  Laterality: N/A;  left     PORTACATH PLACEMENT Left 08/21/2015   Procedure: INSERTION PORT-A-CATH;  Surgeon: Hubbard Robinson, MD;  Location: [facility]  ORS;  Service: General;  Laterality: Left;   REMOVAL OF A DIALYSIS CATHETER  2017   RIGHT HEART CATH N/A 04/04/2020   Procedure: RIGHT HEART CATH;  Surgeon: Wellington Hampshire, MD;  Location: [place] [facility];  Service: Cardiovascular;  Laterality: N/A;    Allergies: Allergies as of 07/08/2021 - Review Complete 07/08/2021  Allergen Reaction Noted   Gabapentin Other (See Comments) 01/16/2016   Adhesive [tape] Itching 08/14/2015    Medications:  Current Facility-Administered Medications:    acetaminophen (TYLENOL) tablet 650 mg, 650 mg, Oral, Q6H PRN, Ivor Costa, MD   acetaminophen-caffeine (EXCEDRIN TENSION HEADACHE) 500-65 MG per tablet 1-2 tablet, 1-2 tablet, Oral, Q6H PRN, Ivor Costa, MD, 2 tablet at 07/08/21 2022   albuterol (PROVENTIL) (2.5 MG/3ML) 0.083% nebulizer solution 2.5 mg, 2.5 mg, Nebulization, Q4H PRN, Ivor Costa, MD   aspirin EC tablet 81 mg, 81 mg, Oral, Daily, Ivor Costa, MD, 81 mg at 07/08/21 1708   [START ON 07/09/2021] cinacalcet (SENSIPAR) tablet 60 mg, 60 mg, Oral, Q lunch, Ivor Costa, MD   dexamethasone (DECADRON) tablet 4 mg, 4 mg, Oral, Q8H, Ivor Costa, MD, 4 mg at 07/08/21 2022   dextromethorphan-guaiFENesin ([place] DM) 30-600 MG per 12 hr tablet 1 tablet, 1 tablet, Oral, BID PRN, Ivor Costa, MD   hydrALAZINE (APRESOLINE) injection 10 mg, 10 mg, Intravenous, Q2H PRN, Ivor Costa, MD, 10 mg at 07/08/21 2050   loperamide (IMODIUM) capsule 2 mg, 2 mg, Oral, BID PRN, Ivor Costa, MD   LORazepam (ATIVAN) tablet 0.5 mg, 0.5 mg, Oral, Q1400, Ivor Costa, MD, 0.5 mg at 07/08/21 1819   metoprolol succinate (TOPROL-XL) 24 hr tablet 50 mg, 50 mg, Oral, Daily, Ivor Costa, MD   multivitamin (RENA-VIT) tablet 1 tablet, 1 tablet, Oral, QHS, Ivor Costa, MD   ondansetron [facility]) injection 4 mg, 4 mg, Intravenous, Q8H PRN, Ivor Costa, MD, 4 mg at 07/08/21 2223   pantoprazole (PROTONIX) EC tablet 40 mg, 40 mg, Oral, Daily, Ivor Costa, MD, 40 mg at 07/08/21 1822    sacubitril-valsartan (ENTRESTO) 24-26 mg per tablet, 1 tablet, Oral, BID, Ivor Costa, MD   sertraline (ZOLOFT) tablet 150 mg, 150 mg, Oral, Daily, Ivor Costa, MD   sevelamer carbonate (RENVELA) tablet 2,400 mg, 2,400 mg, Oral, TID with meals, Ivor Costa, MD, 2,400 mg at 07/08/21 2022   [START ON 07/09/2021] Warfarin - Pharmacist Dosing Inpatient, , Does not apply, q1600, [place] Silos, Amy C, RPH   zolpidem (AMBIEN) tablet 5 mg, 5 mg, Oral, QHS, Ivor Costa, MD, 5 mg at 07/08/21 2222   Social History: Social History   Tobacco Use   Smoking status: Never   Smokeless tobacco: Never  Vaping Use   Vaping Use: Never used  Substance Use Topics   Alcohol use: No    Alcohol/week: 0.0 standard drinks of alcohol   Drug use: No    Family Medical History: Family History  Problem Relation Age of Onset   Stroke Mother    CVA Mother    Hypertension Mother  Hypertension Father    Hypertension Sister    Diabetes Sister    Cancer Sister 31       Breast   Stroke Brother    Hypertension Brother    Breast cancer Maternal Aunt 62    Physical Examination: Vitals:   07/08/21 2030 07/08/21 2122  BP: (!) 178/109 (!) 154/94  Pulse: 82 87  Resp: 20 16  Temp:  98.8 F (37.1 C)  SpO2: 97% 95%     General: Patient is well developed, well nourished, calm, collected, and in no apparent distress.  NEUROLOGICAL:  General: In no acute distress.   Awake, alert, oriented to person, place, and time.  Pupils equal round and reactive to light.  Full Facial tone is symmetric.  Tongue protrusion is midline.  Bilateral upper extremities are full strength proximally and distally, no evidence of pronator drift, there is evidence of tremulousness throughout her entire body.  Language is conversant.  GCS 15   Bilateral upper and lower extremity sensation is intact to light touch.  Imaging: Some interval growth noted on her head CT of her known posterior fossa meningioma.  Most importantly there is no evidence  of hydrocephalus, increased swelling, foramen magnum obstruction, or brainstem compression.  I have personally reviewed the images and agree with the above interpretation.  Labs:    Latest Ref Rng & Units 07/08/2021    2:19 PM 10/26/2020    8:41 AM 10/24/2020    5:47 PM  CBC  WBC 4.0 - 10.5 K/uL 8.3  3.4  3.5   Hemoglobin 12.0 - 15.0 g/dL 10.6  11.7  12.4   Hematocrit 36.0 - 46.0 % 33.2  34.8  36.0   Platelets 150 - 400 K/uL 200  165  131        Assessment and Plan: Teresa Cook is a pleasant 66 y.o. female with history of a known posterior fossa meningioma of the tentorium.  She has known about this for many years.  She was recently admitted for a hypertensive crisis.  As part of her work-up had a head CT which showed some mild to moderate intermediate growth of her known posterior fossa meningioma.  She denies any predating neurologic changes.  Denies any nausea or vomiting.  Denies any new weakness.  Denies any somnolence.  States that she has felt more tremulous recently, but only as of this episode currently.  I have discussed the condition with the patient, including showing the radiographs and discussing treatment options in layman's terms.  Thus, I have recommended the following: No need for steroids, no need for repeat imaging.  Continue to follow her neurologic examination, page if needed..  We will plan to arrange follow-up in the neurosurgery clinic as an outpatient, this need not be urgent.Stevan Born, MD/MSCR Dept. of Neurosurgery

## 2021-07-08 NOTE — Assessment & Plan Note (Signed)
See above

## 2021-07-08 NOTE — Progress Notes (Signed)
[place] for warfarin Indication: afib/ history of DVT  Allergies  Allergen Reactions   Gabapentin Other (See Comments)    Seizure   Adhesive [Tape] Itching    Silk tape is ok to use.    Patient Measurements: Height: '5\' 5"'$  (165.1 cm) Weight: 54.4 kg (120 lb) IBW/kg (Calculated) : 57  Vital Signs: Temp: 98.1 F (36.7 C) (07/02 1410) Temp Source: Oral (07/02 1410) BP: 167/106 (07/02 1530) Pulse Rate: 84 (07/02 1530)  Labs: Recent Labs    07/08/21 1419  HGB 10.6*  HCT 33.2*  PLT 200  LABPROT 21.0*  INR 1.8*  CREATININE 9.20*  TROPONINIHS 37*    Estimated Creatinine Clearance: 5.1 mL/min (A) (by C-G formula based on SCr of 9.2 mg/dL (H)).   Medical History: Past Medical History:  Diagnosis Date   Anemia    Anxiety    Breast cancer ([place]) 01/2016   bilateral   CHF (congestive heart failure) (HCC)    Chronic kidney disease    Depression    Dialysis patient [facility])    DVT (deep venous thrombosis) ([place])    left leg   DVT (deep venous thrombosis) ([place]) 1985   right thigh   Dysrhythmia    Gout    Headache    HTN (hypertension)    Hypertension    Parathyroid abnormality (HCC)    Parathyroid disease ([place])    Pneumonia 12/2015   Psoriasis    Renal insufficiency    Sickle cell trait (HCC)    traits   Medications: PTA warfarin: Take 1 tablet ('5mg'$ ) by mouth every Monday, Wednesday, Friday, Saturday and Sunday evening and take 1.5  tablets (7.'5mg'$ ) by mouth every Tuesday and Thursday evening. Last PTA dose 07/07/21 at 1930  Assessment: 66 y.o. female with history of DVT, afib, and ESRD on MWF HD taking warfarin PTA, presenting with headache. Pharmacy has been consulted for warfarin dosing.  At admission: Hgb low at 10.6, but platelets WNL. No overt bleeding noted. Baseline INR 1.8. Per patient, she gets her INR normally check with dialysis and has the results called in to her MD office. The patient states she had an INR of "2  point something" last week, but I am unable to confirm that in her chart.   Will give increased warfarin dose this evening, given subtherapeutic INR.   Goal of Therapy:  Maintain INR between 2-3 Monitor platelets by anticoagulation protocol: Yes  Plan:  Warfarin 7.'5mg'$  x 1 tonight Monitor CBC, daily PT/INR Continue to monitor for signs/symptoms of bleeding   Brendolyn Patty, PharmD Clinical Pharmacist  03/26/2020   5:13 PM

## 2021-07-08 NOTE — Assessment & Plan Note (Signed)
Calcium 11.5, mental status normal -Check PTH, PTH peptide -Patient will have dialysis tomorrow

## 2021-07-08 NOTE — Assessment & Plan Note (Signed)
Blood pressure 219/103, which improved to 167/106 after given 10 mg of labetalol in the ED - IV hydralazine as needed -Metoprolol, Delene Loll

## 2021-07-08 NOTE — Assessment & Plan Note (Signed)
Troponin level 37, denies chest pain -Aspirin 81 mg -Trend troponin -Check A1c, FLP

## 2021-07-08 NOTE — Assessment & Plan Note (Signed)
Potassium 3.4 -Will not replete potassium due to ESRD -Check magnesium level

## 2021-07-08 NOTE — ED Notes (Signed)
Asked pharmacy to tube excedrin PRN for patient due to continued headache

## 2021-07-08 NOTE — Assessment & Plan Note (Signed)
Likely due to elevated blood pressure.  Increased size of the meningioma may have contributed partially. -As needed Tylenol, as needed Excedrin  -Started Decadron for meningioma

## 2021-07-08 NOTE — Assessment & Plan Note (Signed)
Patient's shortness breath is likely due to fluid overload. Patient has positive JVD, crackles on auscultation, interstitial lung edema on chest x-ray, clinically consistent with fluid overload.  -Placed on telemetry bed for position -Consulted Dr. Holley Raring of renal for dialysis -As needed albuterol

## 2021-07-08 NOTE — ED Triage Notes (Signed)
[facility] reports pt coming from home c/o headache for the past week relieved by excedrin and now c/o sob. Also states her BP has been elevated and has been taking her meds as prescribed.

## 2021-07-08 NOTE — Assessment & Plan Note (Signed)
Hemoglobin 10.6 (11.7 on 10/26/2020), slightly dropped.  No active bleeding -Follow-up by CBC

## 2021-07-08 NOTE — H&P (Signed)
History and Physical    Teresa Cook IOE:[id number] DOB: 1955-06-22 DOA: 07/08/2021  Referring MD/NP/PA:   PCP: Murlean Iba, MD   Patient coming from:  The patient is coming from home.  At baseline, pt is independent for most of ADL.        Chief Complaint: SOB and headache  HPI: Teresa Cook is a 66 y.o. female with medical history significant of ESRD-HD (MWF), hypertension, gout, depression with anxiety, sickle cell trait, DVT on Coumadin, sCHF with EF 40-45%, bilateral breast cancer (s/p of bilateral mastectomy), seizure not on medications currently, meningioma, hyperparathyroidism, who SOB and headache.  Patient states that she has shortness of breath in the past 2 days, which has been progressively worsening.  Patient has dry cough, denies chest pain, no fever or chills.  Oxygen saturation 90% on room air.  Patient does not have nausea, vomiting, diarrhea or abdominal pain.  Patient states that she has chronic headache, which has worsened recently.  The headache is located in the occipital area, constant, 10 out of 10 in severity, nonradiating.  No unilateral numbness or tinglings in extremities.  No facial droop or slurred speech.  No confusion.  Data reviewed independently and ED Course: pt was found to have WBC 8.3, troponin level 37, INR 1.8, negative COVID PCR, potassium 3.4, bicarbonate 28, creatinine 9.20, BUN 44, calcium 11.5, temperature normal, blood pressure 219/103, heart rate 92, RR 28, oxygen saturation 90% on room air, which improved to 93-95% on 2 L oxygen.  Chest x-ray showed interstitial edema.  CT of the head showed enlargement of meningioma with mass effect.  Patient is placed on telemetry bed for position, Dr. Holley Raring of renal and Dr. Tamala Julian of neurosurgery are consulted.   CT-head: Interval increase in size of a 2.7 x 2.2 cm (from 1.4 x 1.5 cm) midline posterior fossa meningioma arising along the right tentorium. Associated mass effect on the vermis and  bilateral cerebellar hemispheres.    EKG: I have personally reviewed.  Sinus rhythm, QTc 446, LAE, LVH, Q-wave in lead III, anteroseptal infarction pattern, mild ST depression in V5-V6.   Review of Systems:   General: no fevers, chills, no body weight gain, has fatigue and HA HEENT: no blurry vision, hearing changes or sore throat Respiratory: has dyspnea, coughing, no wheezing CV: no chest pain, no palpitations GI: no nausea, vomiting, abdominal pain, diarrhea, constipation GU: no dysuria, burning on urination, increased urinary frequency, hematuria  Ext: no leg edema Neuro: no unilateral weakness, numbness, or tingling, no vision change or hearing loss Skin: no rash, no skin tear. MSK: No muscle spasm, no deformity, no limitation of range of movement in spin Heme: No easy bruising.  Travel history: No recent long distant travel.   Allergy:  Allergies  Allergen Reactions   Gabapentin Other (See Comments)    Seizure   Adhesive [Tape] Itching    Silk tape is ok to use.    Past Medical History:  Diagnosis Date   Anemia    Anxiety    Breast cancer ([place]) 01/2016   bilateral   CHF (congestive heart failure) (HCC)    Chronic kidney disease    Depression    Dialysis patient [facility])    DVT (deep venous thrombosis) ([place])    left leg   DVT (deep venous thrombosis) ([place]) 1985   right thigh   Dysrhythmia    Gout    Headache    HTN (hypertension)    Hypertension    Parathyroid abnormality ([place])  Parathyroid disease ([place])    Pneumonia 12/2015   Psoriasis    Renal insufficiency    Sickle cell trait ([place])    traits    Past Surgical History:  Procedure Laterality Date   ABDOMINAL HYSTERECTOMY  1980   APPENDECTOMY     BREAST BIOPSY Left 10/28/2013   benign   BREAST EXCISIONAL BIOPSY Left 2002   benign   CORONARY ANGIOGRAPHY N/A 04/04/2020   Procedure: CORONARY ANGIOGRAPHY;  Surgeon: Wellington Hampshire, MD;  Location: [place] [facility];  Service: Cardiovascular;   Laterality: N/A;   INSERTION OF DIALYSIS CATHETER  2014   LIPOMA EXCISION N/A 01/23/2016   Procedure: EXCISION LIPOMA;  Surgeon: Hubbard Robinson, MD;  Location: [facility] OR[facility];  Service: General;  Laterality: N/A;   MASTECTOMY W/ SENTINEL NODE BIOPSY Bilateral 01/23/2016   Procedure: bilateral MASTECTOMY WITH  bilateral SENTINEL LYMPH NODE BIOPSY possible left axillary node dissection forehead lipoma removal;  Surgeon: Hubbard Robinson, MD;  Location: [facility] OR[facility];  Service: General;  Laterality: Bilateral;   PARTIAL HYSTERECTOMY     PERIPHERAL VASCULAR CATHETERIZATION N/A 12/25/2015   Procedure: Dialysis/Perma Catheter Insertion;  Surgeon: Algernon Huxley, MD;  Location: [place] [facility];  Service: Cardiovascular;  Laterality: N/A;   PERIPHERAL VASCULAR CATHETERIZATION Left 01/22/2016   Procedure: Dialysis/Perma Catheter Insertion;  Surgeon: Algernon Huxley, MD;  Location: [place] [facility];  Service: Cardiovascular;  Laterality: Left;   PERIPHERAL VASCULAR CATHETERIZATION N/A 01/26/2016   Procedure: Dialysis/Perma Catheter Insertion;  Surgeon: Katha Cabal, MD;  Location: [place] [facility];  Service: Cardiovascular;  Laterality: N/A;   PORT-A-CATH REMOVAL N/A 12/20/2015   Procedure: REMOVAL PORT-A-CATH;  Surgeon: Hubbard Robinson, MD;  Location: [facility];  Service: General;  Laterality: N/A;  left     PORTACATH PLACEMENT Left 08/21/2015   Procedure: INSERTION PORT-A-CATH;  Surgeon: Hubbard Robinson, MD;  Location: [facility] OR[facility];  Service: General;  Laterality: Left;   REMOVAL OF A DIALYSIS CATHETER  2017   RIGHT HEART CATH N/A 04/04/2020   Procedure: RIGHT HEART CATH;  Surgeon: Wellington Hampshire, MD;  Location: [place] [facility];  Service: Cardiovascular;  Laterality: N/A;    Social History:  reports that she has never smoked. She has never used smokeless tobacco. She reports that she does not drink alcohol and does not use drugs.  Family History:  Family History  Problem Relation Age of  Onset   Stroke Mother    CVA Mother    Hypertension Mother    Hypertension Father    Hypertension Sister    Diabetes Sister    Cancer Sister 46       Breast   Stroke Brother    Hypertension Brother    Breast cancer Maternal Aunt 70     Prior to Admission medications   Medication Sig Start Date End Date Taking? Authorizing Provider  albuterol (VENTOLIN HFA) 108 (90 Base) MCG/ACT inhaler Inhale 2 puffs into the lungs every 4 (four) hours as needed for wheezing or shortness of breath. 10/26/20   Sharen Hones, MD  aspirin-acetaminophen-caffeine (EXCEDRIN MIGRAINE) [phone] MG tablet Take 1-2 tablets by mouth every 6 (six) hours as needed for headache.    [provider]  b complex-vitamin c-folic acid (NEPHRO-VITE) 0.8 MG TABS tablet Take 1 tablet by mouth daily.    [provider]  cinacalcet (SENSIPAR) 60 MG tablet Take 60 mg by mouth daily before lunch.    [provider]  loperamide (IMODIUM)  2 MG capsule Take 1 capsule (2 mg total) by mouth 2 (two) times daily as needed for diarrhea or loose stools. 10/26/20   Sharen Hones, MD  metoprolol succinate (TOPROL-XL) 50 MG 24 hr tablet Take 1 tablet (50 mg total) by mouth daily. Take with or immediately following a meal. 01/26/20   Danford, Suann Larry, MD  pantoprazole (PROTONIX) 40 MG tablet Take 1 tablet (40 mg total) by mouth daily. 01/26/20   Danford, Suann Larry, MD  sacubitril-valsartan (ENTRESTO) 24-26 MG Take 1 tablet by mouth 2 (two) times daily. 02/15/21   Kate Sable, MD  sertraline (ZOLOFT) 100 MG tablet Take 1.5 tablets (150 mg total) by mouth daily. 01/26/20   Danford, Suann Larry, MD  sevelamer carbonate (RENVELA) 800 MG tablet Take 800-2,400 mg by mouth See admin instructions. Take 3 tablets ('2400mg'$ ) by mouth three times daily before meals and take 1 tablet ('800mg'$ ) by mouth daily before a snack    [provider]  warfarin (COUMADIN) 5 MG tablet Take 1-1.5 tablets (5-7.5 mg total) by  mouth See admin instructions. Take 1 tablet ('5mg'$ ) by mouth every Monday, Wednesday, Friday, Saturday and Sunday evening and take 1.5  tablets (7.'5mg'$ ) by mouth every Tuesday and Thursday evening 10/26/20   Sharen Hones, MD  zolpidem (AMBIEN) 10 MG tablet Take 1 tablet (10 mg total) by mouth at bedtime. 01/26/20   Edwin Dada, MD    Physical Exam: Vitals:   07/08/21 1530 07/08/21 1700 07/08/21 1730 07/08/21 1828  BP: (!) 167/106 (!) 175/112 (!) 189/125 (!) 186/115  Pulse: 84 79 84 83  Resp: 20 (!) 23 18 (!) 24  Temp:      TempSrc:      SpO2: 95% 95% 98% 97%  Weight:      Height:       General: Not in acute distress HEENT:       Eyes: PERRL, EOMI, no scleral icterus.       ENT: No discharge from the ears and nose, no pharynx injection, no tonsillar enlargement.        Neck: positive JVD, no bruit, no mass felt. Heme: No neck lymph node enlargement. Cardiac: S1/S2, RRR, No murmurs, No gallops or rubs. Respiratory: Has crackles on auscultation. GI: Soft, nondistended, nontender, no rebound pain, no organomegaly, BS present. GU: No hematuria Ext: No pitting leg edema bilaterally. 1+DP/PT pulse bilaterally. Musculoskeletal: No joint deformities, No joint redness or warmth, no limitation of ROM in spin. Skin: No rashes.  Neuro: Alert, oriented X3, cranial nerves II-XII grossly intact, moves all extremities normally.  Psych: Patient is not psychotic, no suicidal or hemocidal ideation.  Labs on Admission: I have personally reviewed following labs and imaging studies  CBC: Recent Labs  Lab 07/08/21 1419  WBC 8.3  HGB 10.6*  HCT 33.2*  MCV 86.5  PLT 875   Basic Metabolic Panel: Recent Labs  Lab 07/08/21 1419 07/08/21 1705  NA 143  --   K 3.4*  --   CL 101  --   CO2 28  --   GLUCOSE 120*  --   BUN 44*  --   CREATININE 9.20*  --   CALCIUM 11.5*  --   MG  --  2.3   GFR: Estimated Creatinine Clearance: 5.1 mL/min (A) (by C-G formula based on SCr of 9.2 mg/dL  (H)). Liver Function Tests: Recent Labs  Lab 07/08/21 1419  AST 27  ALT 23  ALKPHOS 66  BILITOT 0.7  PROT 7.4  ALBUMIN 3.9   No results for input(s): "LIPASE", "AMYLASE" in the last 168 hours. No results for input(s): "AMMONIA" in the last 168 hours. Coagulation Profile: Recent Labs  Lab 07/08/21 1419  INR 1.8*   Cardiac Enzymes: No results for input(s): "CKTOTAL", "CKMB", "CKMBINDEX", "TROPONINI" in the last 168 hours. BNP (last 3 results) No results for input(s): "PROBNP" in the last 8760 hours. HbA1C: No results for input(s): "HGBA1C" in the last 72 hours. CBG: No results for input(s): "GLUCAP" in the last 168 hours. Lipid Profile: No results for input(s): "CHOL", "HDL", "LDLCALC", "TRIG", "CHOLHDL", "LDLDIRECT" in the last 72 hours. Thyroid Function Tests: No results for input(s): "TSH", "T4TOTAL", "FREET4", "T3FREE", "THYROIDAB" in the last 72 hours. Anemia Panel: No results for input(s): "VITAMINB12", "FOLATE", "FERRITIN", "TIBC", "IRON", "RETICCTPCT" in the last 72 hours. Urine analysis:    Component Value Date/Time   COLORURINE STRAW (A) 09/09/2016 1521   APPEARANCEUR CLEAR (A) 09/09/2016 1521   APPEARANCEUR Clear 09/08/2013 0133   LABSPEC 1.008 09/09/2016 1521   LABSPEC 1.013 09/08/2013 0133   PHURINE 8.0 09/09/2016 1521   GLUCOSEU 50 (A) 09/09/2016 1521   GLUCOSEU 50 mg/dL 09/08/2013 0133   HGBUR NEGATIVE 09/09/2016 1521   BILIRUBINUR NEGATIVE 09/09/2016 1521   BILIRUBINUR Negative 09/08/2013 0133   KETONESUR NEGATIVE 09/09/2016 1521   PROTEINUR 100 (A) 09/09/2016 1521   NITRITE NEGATIVE 09/09/2016 1521   LEUKOCYTESUR NEGATIVE 09/09/2016 1521   LEUKOCYTESUR Negative 09/08/2013 0133   Sepsis Labs: '@LABRCNTIP'$ (procalcitonin:4,lacticidven:4) ) Recent Results (from the past 240 hour(s))  SARS Coronavirus 2 by RT PCR (hospital order, performed in [place] hospital lab) *cepheid single result test* Anterior Nasal Swab     Status: None   Collection  Time: 07/08/21  2:19 PM   Specimen: Anterior Nasal Swab  Result Value Ref Range Status   SARS Coronavirus 2 by RT PCR NEGATIVE NEGATIVE Final    Comment: (NOTE) SARS-CoV-2 target nucleic acids are NOT DETECTED.  The SARS-CoV-2 RNA is generally detectable in upper and lower respiratory specimens during the acute phase of infection. The lowest concentration of SARS-CoV-2 viral copies this assay can detect is 250 copies / mL. A negative result does not preclude SARS-CoV-2 infection and should not be used as the sole basis for treatment or other patient management decisions.  A negative result may occur with improper specimen collection / handling, submission of specimen other than nasopharyngeal swab, presence of viral mutation(s) within the areas targeted by this assay, and inadequate number of viral copies (<250 copies / mL). A negative result must be combined with clinical observations, patient history, and epidemiological information.  Fact Sheet for Patients:   https://www.patel.info/  Fact Sheet for Healthcare Providers: https://hall.com/  This test is not yet approved or  cleared by the [place] FDA and has been authorized for detection and/or diagnosis of SARS-CoV-2 by FDA under an Emergency Use Authorization (EUA).  This EUA will remain in effect (meaning this test can be used) for the duration of the COVID-19 declaration under Section 564(b)(1) of the Act, 21 U.S.C. section 360bbb-3(b)(1), unless the authorization is terminated or revoked sooner.  Performed at [facility], [address] [place], [place] [address]      Radiological Exams on Admission: CT HEAD WO CONTRAST (5MM)  Result Date: 07/08/2021 CLINICAL DATA:  Headache, new or worsening (Age >= 50y). c/o headache for the past week relieved by excedrin and now c/o sob. Also states her BP has been elevated and has been taking her meds  as prescribed" EXAM:  CT HEAD WITHOUT CONTRAST TECHNIQUE: Contiguous axial images were obtained from the base of the skull through the vertex without intravenous contrast. RADIATION DOSE REDUCTION: This exam was performed according to the departmental dose-optimization program which includes automated exposure control, adjustment of the mA and/or kV according to patient size and/or use of iterative reconstruction technique. COMPARISON:  CT head 12/31/2015, MRI head 01/01/2016 FINDINGS: Brain: No evidence of large-territorial acute infarction. No parenchymal hemorrhage. Interval increase in size of a 2.7 x 2.2 x 1.7 cm (from 1.4 x 1.5 cm) midline posterior fossa mass arising along the right tentorium. Mass is again noted to be partially calcified. No extra-axial collection. Associated mass effect on the vermis and bilateral cerebellar hemispheres. No hydrocephalus. Basilar cisterns are patent. Vascular: No hyperdense vessel. Skull: No acute fracture or focal lesion. Sinuses/Orbits: Paranasal sinuses and mastoid air cells are clear. The orbits are unremarkable. Other: None. IMPRESSION: Interval increase in size of a 2.7 x 2.2 cm (from 1.4 x 1.5 cm) midline posterior fossa meningioma arising along the right tentorium. Associated mass effect on the vermis and bilateral cerebellar hemispheres. Electronically Signed   By: Iven Finn M.D.   On: 07/08/2021 15:06   DG Chest Portable 1 View  Result Date: 07/08/2021 CLINICAL DATA:  [facility] reports pt coming from home c/o headache for the past week relieved by excedrin and now c/o sob. Also states her BP has been elevated and has been taking her meds as prescribed EXAM: PORTABLE CHEST 1 VIEW COMPARISON:  10/25/2020 and older studies. FINDINGS: Cardiac silhouette is normal in size. No mediastinal or hilar masses. Tunneled left anterior chest wall, internal jugular, central venous catheter is stable. Bilateral interstitial thickening has increased compared to the prior exam. There is mild  intervening hazy airspace opacities. No visualized pleural effusion.  No pneumothorax. Skeletal structures are grossly intact. IMPRESSION: 1. Interstitial thickening and mild hazy airspace opacities consistent with pulmonary edema. Electronically Signed   By: Lajean Manes M.D.   On: 07/08/2021 14:49      Assessment/Plan Principal Problem:   Fluid overload Active Problems:   Chronic systolic CHF (congestive heart failure) (HCC)   ESRD on dialysis (HCC)   Anemia in ESRD (end-stage renal disease) (HCC)   HTN (hypertension)   Meningioma, cerebral (HCC)   Headache   Parathyroid disease (HCC)   Hypercalcemia   DVT (deep venous thrombosis) (HCC)   Depression with anxiety   Hypokalemia   Myocardial injury   Principal Problem:   Fluid overload Active Problems:   Chronic systolic CHF (congestive heart failure) (HCC)   ESRD on dialysis (HCC)   Anemia in ESRD (end-stage renal disease) (HCC)   HTN (hypertension)   Meningioma, cerebral (HCC)   Headache   Parathyroid disease (HCC)   Hypercalcemia   DVT (deep venous thrombosis) (HCC)   Depression with anxiety   Hypokalemia   Myocardial injury   Assessment and Plan: * Fluid overload Patient's shortness breath is likely due to fluid overload. Patient has positive JVD, crackles on auscultation, interstitial lung edema on chest x-ray, clinically consistent with fluid overload.  -Placed on telemetry bed for position -Consulted Dr. Holley Raring of renal for dialysis -As needed albuterol  Chronic systolic CHF (congestive heart failure) ([place]) 2D echo on 01/23/2020 showed EF of 40-45%.  Now has fluid overload -Volume management per renal dialysis  ESRD on dialysis [facility]) - Consulted renal for dialysis, Dr. Holley Raring  Anemia in ESRD (end-stage renal disease) ([place]) Hemoglobin 10.6 (11.7 on 10/26/2020),  slightly dropped.  No active bleeding -Follow-up by CBC  HTN (hypertension) Blood pressure 219/103, which improved to 167/106 after given 10 mg  of labetalol in the ED - IV hydralazine as needed -Metoprolol, Entresto  Meningioma, cerebral [facility]) Patient has history of a meningioma.  CT of head today showed increased size of meningioma with some mass effect.  Mental status normal.  No focal neurologic deficit on physical examination. -Consulted with Dr. Tamala Julian of neurosurgery -Start Decadron 4 mg every 8 hours  Headache Likely due to elevated blood pressure.  Increased size of the meningioma may have contributed partially. -As needed Tylenol, as needed Excedrin  -Started Decadron for meningioma  Parathyroid disease ([place]) Calcium 11.5, mental status normal -Check PTH, PTH peptide -Patient will have dialysis tomorrow  Hypercalcemia - See above  DVT (deep venous thrombosis) (HCC) - Continue Coumadin per pharm  Depression with anxiety - Continue home medications  Hypokalemia Potassium 3.4 -Will not replete potassium due to ESRD -Check magnesium level  Myocardial injury Troponin level 37, denies chest pain -Aspirin 81 mg -Trend troponin -Check A1c, FLP             DVT ppx: SQ Heparin   Code Status:  Partial code (I discussed with the patient and explained the meaning of CODE STATUS, patient wants to be partial code, OK for CPR, but no intubation).  Family Communication:  Yes, patient's daughter  by phone  Disposition Plan:  Anticipate discharge back to previous environment  Consults called:  Dr. Holley Raring of renal and Dr. Tamala Julian of neurosurgery are consulted.  Admission status and Level of care: Telemetry Medical:    for obs     Severity of Illness:  The appropriate patient status for this patient is OBSERVATION. Observation status is judged to be reasonable and necessary in order to provide the required intensity of service to ensure the patient's safety. The patient's presenting symptoms, physical exam findings, and initial radiographic and laboratory data in the context of their medical condition is  felt to place them at decreased risk for further clinical deterioration. Furthermore, it is anticipated that the patient will be medically stable for discharge from the hospital within 2 midnights of admission.        Date of Service 07/08/2021    Ivor Costa [facility]   If 7PM-7AM, please contact night-coverage [email or web address] 07/08/2021, 6:55 PM

## 2021-07-08 NOTE — Assessment & Plan Note (Signed)
-   Consulted renal for dialysis, Dr. Holley Raring

## 2021-07-08 NOTE — Assessment & Plan Note (Signed)
-   Continue home medications 

## 2021-07-09 DIAGNOSIS — I248 Other forms of acute ischemic heart disease: Secondary | ICD-10-CM | POA: Diagnosis present

## 2021-07-09 DIAGNOSIS — Z8249 Family history of ischemic heart disease and other diseases of the circulatory system: Secondary | ICD-10-CM | POA: Diagnosis not present

## 2021-07-09 DIAGNOSIS — Z853 Personal history of malignant neoplasm of breast: Secondary | ICD-10-CM | POA: Diagnosis not present

## 2021-07-09 DIAGNOSIS — E8770 Fluid overload, unspecified: Secondary | ICD-10-CM | POA: Diagnosis present

## 2021-07-09 DIAGNOSIS — F418 Other specified anxiety disorders: Secondary | ICD-10-CM | POA: Diagnosis present

## 2021-07-09 DIAGNOSIS — M109 Gout, unspecified: Secondary | ICD-10-CM | POA: Diagnosis present

## 2021-07-09 DIAGNOSIS — Z79899 Other long term (current) drug therapy: Secondary | ICD-10-CM | POA: Diagnosis not present

## 2021-07-09 DIAGNOSIS — I132 Hypertensive heart and chronic kidney disease with heart failure and with stage 5 chronic kidney disease, or end stage renal disease: Secondary | ICD-10-CM | POA: Diagnosis present

## 2021-07-09 DIAGNOSIS — Z86718 Personal history of other venous thrombosis and embolism: Secondary | ICD-10-CM | POA: Diagnosis not present

## 2021-07-09 DIAGNOSIS — Z9013 Acquired absence of bilateral breasts and nipples: Secondary | ICD-10-CM | POA: Diagnosis not present

## 2021-07-09 DIAGNOSIS — N186 End stage renal disease: Secondary | ICD-10-CM | POA: Diagnosis present

## 2021-07-09 DIAGNOSIS — I5022 Chronic systolic (congestive) heart failure: Secondary | ICD-10-CM | POA: Diagnosis present

## 2021-07-09 DIAGNOSIS — E876 Hypokalemia: Secondary | ICD-10-CM | POA: Diagnosis present

## 2021-07-09 DIAGNOSIS — Z90711 Acquired absence of uterus with remaining cervical stump: Secondary | ICD-10-CM | POA: Diagnosis not present

## 2021-07-09 DIAGNOSIS — Z833 Family history of diabetes mellitus: Secondary | ICD-10-CM | POA: Diagnosis not present

## 2021-07-09 DIAGNOSIS — E8779 Other fluid overload: Secondary | ICD-10-CM

## 2021-07-09 DIAGNOSIS — D573 Sickle-cell trait: Secondary | ICD-10-CM | POA: Diagnosis present

## 2021-07-09 DIAGNOSIS — D320 Benign neoplasm of cerebral meninges: Secondary | ICD-10-CM | POA: Diagnosis not present

## 2021-07-09 DIAGNOSIS — Z992 Dependence on renal dialysis: Secondary | ICD-10-CM | POA: Diagnosis not present

## 2021-07-09 DIAGNOSIS — N2581 Secondary hyperparathyroidism of renal origin: Secondary | ICD-10-CM | POA: Diagnosis present

## 2021-07-09 DIAGNOSIS — Z7901 Long term (current) use of anticoagulants: Secondary | ICD-10-CM | POA: Diagnosis not present

## 2021-07-09 DIAGNOSIS — Z803 Family history of malignant neoplasm of breast: Secondary | ICD-10-CM | POA: Diagnosis not present

## 2021-07-09 DIAGNOSIS — Z20822 Contact with and (suspected) exposure to covid-19: Secondary | ICD-10-CM | POA: Diagnosis present

## 2021-07-09 DIAGNOSIS — D631 Anemia in chronic kidney disease: Secondary | ICD-10-CM | POA: Diagnosis present

## 2021-07-09 DIAGNOSIS — Z823 Family history of stroke: Secondary | ICD-10-CM | POA: Diagnosis not present

## 2021-07-09 LAB — BASIC METABOLIC PANEL
Anion gap: 9 (ref 5–15)
BUN: 56 mg/dL — ABNORMAL HIGH (ref 8–23)
CO2: 27 mmol/L (ref 22–32)
Calcium: 10.7 mg/dL — ABNORMAL HIGH (ref 8.9–10.3)
Chloride: 104 mmol/L (ref 98–111)
Creatinine, Ser: 10.43 mg/dL — ABNORMAL HIGH (ref 0.44–1.00)
GFR, Estimated: 4 mL/min — ABNORMAL LOW (ref >60)
Glucose, Bld: 113 mg/dL — ABNORMAL HIGH (ref 70–99)
Potassium: 4 mmol/L (ref 3.5–5.1)
Sodium: 140 mmol/L (ref 135–145)

## 2021-07-09 LAB — PROTIME-INR
INR: 2.3 — ABNORMAL HIGH (ref 0.8–1.2)
Prothrombin Time: 25.2 seconds — ABNORMAL HIGH (ref 11.4–15.2)

## 2021-07-09 LAB — CBC
HCT: 27.3 % — ABNORMAL LOW (ref 36.0–46.0)
Hemoglobin: 9 g/dL — ABNORMAL LOW (ref 12.0–15.0)
MCH: 28.3 pg (ref 26.0–34.0)
MCHC: 33 g/dL (ref 30.0–36.0)
MCV: 85.8 fL (ref 80.0–100.0)
Platelets: 180 10*3/uL (ref 150–400)
RBC: 3.18 MIL/uL — ABNORMAL LOW (ref 3.87–5.11)
RDW: 18.3 % — ABNORMAL HIGH (ref 11.5–15.5)
WBC: 8.2 10*3/uL (ref 4.0–10.5)
nRBC: 0 % (ref 0.0–0.2)

## 2021-07-09 LAB — LIPID PANEL
Cholesterol: 178 mg/dL (ref 0–200)
HDL: 57 mg/dL (ref >40)
LDL Cholesterol: 113 mg/dL — ABNORMAL HIGH (ref 0–99)
Total CHOL/HDL Ratio: 3.1 RATIO
Triglycerides: 41 mg/dL (ref <150)
VLDL: 8 mg/dL (ref 0–40)

## 2021-07-09 LAB — MRSA NEXT GEN BY PCR, NASAL: MRSA by PCR Next Gen: NOT DETECTED

## 2021-07-09 LAB — HEPATITIS C ANTIBODY: HCV Ab: NONREACTIVE

## 2021-07-09 LAB — HEPATITIS B SURFACE ANTIBODY,QUALITATIVE: Hep B S Ab: REACTIVE — AB

## 2021-07-09 LAB — HEPATITIS B SURFACE ANTIGEN: Hepatitis B Surface Ag: NONREACTIVE

## 2021-07-09 LAB — HEPATITIS B CORE ANTIBODY, TOTAL: Hep B Core Total Ab: NONREACTIVE

## 2021-07-09 LAB — HIV ANTIBODY (ROUTINE TESTING W REFLEX): HIV Screen 4th Generation wRfx: NONREACTIVE

## 2021-07-09 MED ORDER — PENTAFLUOROPROP-TETRAFLUOROETH EX AERO: 1.0000 | INHALATION_SPRAY | CUTANEOUS | Status: DC | PRN

## 2021-07-09 MED ORDER — NEPRO/CARBSTEADY PO LIQD
237.0000 mL | Freq: Two times a day (BID) | ORAL | Status: DC
  Administered 2021-07-10 (×2): 237 mL via ORAL

## 2021-07-09 MED ORDER — ALTEPLASE 2 MG IJ SOLR: 2.0000 mg | Freq: Once | INTRAMUSCULAR | Status: DC | PRN

## 2021-07-09 MED ORDER — LIDOCAINE HCL (PF) 1 % IJ SOLN: 5.0000 mL | INTRAMUSCULAR | Status: DC | PRN

## 2021-07-09 MED ORDER — CHLORHEXIDINE GLUCONATE CLOTH 2 % EX PADS
6.0000 | MEDICATED_PAD | Freq: Every day | CUTANEOUS | Status: DC
Start: 2021-07-09 — End: 2021-07-10
  Administered 2021-07-09 – 2021-07-10 (×2): 6 via TOPICAL

## 2021-07-09 MED ORDER — WARFARIN SODIUM 5 MG PO TABS
5.0000 mg | ORAL_TABLET | Freq: Once | ORAL | Status: AC
  Administered 2021-07-09: 5 mg via ORAL
  Filled 2021-07-09: qty 1

## 2021-07-09 MED ORDER — LIDOCAINE-PRILOCAINE 2.5-2.5 % EX CREA: 1.0000 | TOPICAL_CREAM | CUTANEOUS | Status: DC | PRN

## 2021-07-09 MED ORDER — MELATONIN 5 MG PO TABS
5.0000 mg | ORAL_TABLET | Freq: Once | ORAL | Status: AC
  Administered 2021-07-10: 5 mg via ORAL
  Filled 2021-07-09: qty 1

## 2021-07-09 MED ORDER — HEPARIN SODIUM (PORCINE) 1000 UNIT/ML DIALYSIS: 1000.0000 [IU] | INTRAMUSCULAR | Status: DC | PRN

## 2021-07-09 MED ORDER — ORAL CARE MOUTH RINSE: 15.0000 mL | OROMUCOSAL | Status: DC | PRN

## 2021-07-09 NOTE — TOC Initial Note (Signed)
Transition of Care [facility]) - Initial/Assessment Note    Patient Details  Name: Teresa Cook MRN: [id number] Date of Birth: 25-May-1955  Transition of Care [facility]) CM/SW Contact:    Beverly Sessions, RN Phone Number: 07/09/2021, 4:01 PM  Clinical Narrative:                  Admitted UMP:NTIRW overload Admitted from: Home alone PCP: Ju[facility] - drives her self for appointments and HD [facility].  Denies issues obtaining medications.  Current home health/prior home health/DME:NA   Elvera Bicker dialysis liaison notified of admission.   Discussed option of home health RN for CHF.  Patient declines.   Patient states that her daughter will be transporting at discharge  [facility] consulted for heart failure screen. Heart failure RN Delmar Landau has been informed.       Patient Goals and CMS Choice        Expected Discharge Plan and Services                                                Prior Living Arrangements/Services                       Activities of Daily Living Home Assistive Devices/Equipment: None ADL Screening (condition at time of admission) Patient's cognitive ability adequate to safely complete daily activities?: Yes Is the patient deaf or have difficulty hearing?: No Does the patient have difficulty seeing, even when wearing glasses/contacts?: No Does the patient have difficulty concentrating, remembering, or making decisions?: No Patient able to express need for assistance with ADLs?: Yes Does the patient have difficulty dressing or bathing?: No Independently performs ADLs?: Yes (appropriate for developmental age) Does the patient have difficulty walking or climbing stairs?: Yes Weakness of Legs: Both Weakness of Arms/Hands: None  Permission Sought/Granted                  Emotional Assessment              Admission diagnosis:  Acute pulmonary edema ([place]) [J81.0] Fluid overload [E87.70] Hypervolemia, unspecified  hypervolemia type [E87.70] Hypertension, unspecified type [I10] Patient Active Problem List   Diagnosis Date Noted   Fluid overload 07/08/2021   Parathyroid disease ([place])    Hypercalcemia    Depression with anxiety    Anemia in ESRD (end-stage renal disease) ([place])    Headache    Meningioma, cerebral ([place])    Hypokalemia    Myocardial injury    COVID 10/26/2020   Generalized weakness 10/25/2020   COVID-19 virus infection 10/25/2020   DVT (deep venous thrombosis) (HCC)    Chronic systolic CHF (congestive heart failure) (HCC)    Nausea vomiting and diarrhea    HFrEF (heart failure with reduced ejection fraction) ([place])    Acute CHF ([place]) 01/23/2020   CHF (congestive heart failure) ([place]) 01/13/2017   Protein-calorie malnutrition, severe 01/13/2017   History of seizure 11/19/2016   NICM (nonischemic cardiomyopathy) ([place]) 09/12/2016   Elevated troponin 09/12/2016   PNA (pneumonia) 09/12/2016   Accelerated hypertension 09/12/2016   Pressure injury of skin 09/11/2016   Headache disorder 05/14/2016   Spell of altered cognition 05/14/2016   HTN (hypertension) 02/29/2016   Moderate recurrent major depression ([place]) 02/29/2016   Panic attacks 02/29/2016   Scalp lesion    Breast cancer ([place]) 01/23/2016  ESRD (end stage renal disease) ([place]) 01/22/2016   Severe anemia 01/01/2016   Seizure ([place]) 12/31/2015   Healthcare-associated pneumonia    Bloodstream infection due to Port-A-Cath, initial encounter    Sepsis ([place]) 12/15/2015   Chest pain 08/31/2015   SIRS (systemic inflammatory response syndrome) ([place]) 08/27/2015   Malignant neoplasm of upper-inner quadrant of left breast in female, estrogen receptor positive ([place]) 08/05/2015   Pulmonary edema 07/09/2015   Dizziness 03/03/2015   Seizures ([place]) 03/03/2015   Spells 11/11/2014   Acute respiratory failure with hypoxia ([place]) 09/19/2014   Left ventricular dysfunction 09/06/2014   Acute pulmonary edema ([place]) 08/30/2014   Respiratory  failure ([place]) 08/29/2014   Respiratory difficulty 08/29/2014   Acute on chronic systolic heart failure ([place]) 05/16/2014   Hypertension 05/16/2014   Renal failure 05/16/2014   Anxiety 05/16/2014   Sickle cell trait ([place]) 03/03/2014   ESRD on dialysis ([place]) 11/02/2013   Dysgeusia 01/27/2013   Weight loss 01/27/2013   Resting tremor 08/13/2012   Depression 04/30/2012   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

## 2021-07-09 NOTE — Progress Notes (Signed)
PROGRESS NOTE    Teresa Cook  TKW:[id number] DOB: 1955/12/04 DOA: 07/08/2021 PCP: Murlean Iba, MD  Assessment & Plan:   Principal Problem:   Fluid overload Active Problems:   Chronic systolic CHF (congestive heart failure) (HCC)   ESRD on dialysis ([place])   Anemia in ESRD (end-stage renal disease) ([place])   HTN (hypertension)   Meningioma, cerebral (HCC)   Headache   Parathyroid disease ([place])   Hypercalcemia   DVT (deep venous thrombosis) ([place])   Depression with anxiety   Hypokalemia   Myocardial injury  Assessment and Plan: Fluid overload: has positive JVD, crackles on auscultation & interstitial edema on CXR. Will need HD. Nephro following and recs apprec    Chronic systolic CHF: echo on 09/30/2681 showed EF of 40-45%.  Now has fluid overload Volume management w/ HD    ESRD: on HD. Nephro recs apprec   ACD: likely secondary to ESRD.    HTN: continue on metoprolol, entresto. IV hydralazine prn    Hx of meningioma: CT of head today showed increased size of meningioma with some mass effect.  Mental status normal.  Continue on steroids. Neuro surg following    Headache: w/ hx of meningioma. Tylenol prn    Parathyroid disease: w/ hypercalcemia. Management w/ HD  DVT: continue on warfarin. Pharmacy to dose & monitor    Depression: severity unknown. Continue on home dose of sertraline    Hypokalemia: WNL today    Myocardial injury: elevated troponins secondary to demand ischemia. Continue on aspirin       DVT prophylaxis:  warfarin  Code Status: partial  Family Communication:  Disposition Plan: likely d/c back home   Level of care: Telemetry Medical  Status is: Observation The patient remains OBS appropriate and will d/c before 2 midnights.    Consultants:  Nephro   Procedures:   Antimicrobials:   Subjective: Pt c/o fatigue  Objective: Vitals:   07/08/21 2030 07/08/21 2120 07/08/21 2122 07/09/21 0530  BP: (!) 178/109  (!) 154/94 (!) 154/98   Pulse: 82  87 85  Resp: '20  16 16  '$ Temp:   98.8 F (37.1 C) 98.9 F (37.2 C)  TempSrc:      SpO2: 97%  95% 94%  Weight:  59.2 kg    Height:  '5\' 5"'$  (1.651 m)     No intake or output data in the 24 hours ending 07/09/21 0755 Filed Weights   07/08/21 1408 07/08/21 2120  Weight: 54.4 kg 59.2 kg    Examination:  General exam: Appears calm and comfortable  Respiratory system: Clear to auscultation. Respiratory effort normal. Cardiovascular system: S1 & S2 +. No rubs, gallops or clicks.  Gastrointestinal system: Abdomen is nondistended, soft and nontender.  Normal bowel sounds heard. Central nervous system: Alert and oriented. Moves all extremities Psychiatry: Judgement and insight appears at baseline. Frustrated mood and affect.     Data Reviewed: I have personally reviewed following labs and imaging studies  CBC: Recent Labs  Lab 07/08/21 1419 07/09/21 0429  WBC 8.3 8.2  HGB 10.6* 9.0*  HCT 33.2* 27.3*  MCV 86.5 85.8  PLT 200 419   Basic Metabolic Panel: Recent Labs  Lab 07/08/21 1419 07/08/21 1705 07/09/21 0429  NA 143  --  140  K 3.4*  --  4.0  CL 101  --  104  CO2 28  --  27  GLUCOSE 120*  --  113*  BUN 44*  --  56*  CREATININE 9.20*  --  10.43*  CALCIUM 11.5*  --  10.7*  MG  --  2.3  --    GFR: Estimated Creatinine Clearance: 4.7 mL/min (A) (by C-G formula based on SCr of 10.43 mg/dL (H)). Liver Function Tests: Recent Labs  Lab 07/08/21 1419  AST 27  ALT 23  ALKPHOS 66  BILITOT 0.7  PROT 7.4  ALBUMIN 3.9   No results for input(s): "LIPASE", "AMYLASE" in the last 168 hours. No results for input(s): "AMMONIA" in the last 168 hours. Coagulation Profile: Recent Labs  Lab 07/08/21 1419 07/09/21 0429  INR 1.8* 2.3*   Cardiac Enzymes: No results for input(s): "CKTOTAL", "CKMB", "CKMBINDEX", "TROPONINI" in the last 168 hours. BNP (last 3 results) No results for input(s): "PROBNP" in the last 8760 hours. HbA1C: No results for input(s):  "HGBA1C" in the last 72 hours. CBG: No results for input(s): "GLUCAP" in the last 168 hours. Lipid Profile: Recent Labs    07/09/21 0429  CHOL 178  HDL 57  LDLCALC 113*  TRIG 41  CHOLHDL 3.1   Thyroid Function Tests: No results for input(s): "TSH", "T4TOTAL", "FREET4", "T3FREE", "THYROIDAB" in the last 72 hours. Anemia Panel: No results for input(s): "VITAMINB12", "FOLATE", "FERRITIN", "TIBC", "IRON", "RETICCTPCT" in the last 72 hours. Sepsis Labs: No results for input(s): "PROCALCITON", "LATICACIDVEN" in the last 168 hours.  Recent Results (from the past 240 hour(s))  SARS Coronavirus 2 by RT PCR (hospital order, performed in [facility] hospital lab) *cepheid single result test* Anterior Nasal Swab     Status: None   Collection Time: 07/08/21  2:19 PM   Specimen: Anterior Nasal Swab  Result Value Ref Range Status   SARS Coronavirus 2 by RT PCR NEGATIVE NEGATIVE Final    Comment: (NOTE) SARS-CoV-2 target nucleic acids are NOT DETECTED.  The SARS-CoV-2 RNA is generally detectable in upper and lower respiratory specimens during the acute phase of infection. The lowest concentration of SARS-CoV-2 viral copies this assay can detect is 250 copies / mL. A negative result does not preclude SARS-CoV-2 infection and should not be used as the sole basis for treatment or other patient management decisions.  A negative result may occur with improper specimen collection / handling, submission of specimen other than nasopharyngeal swab, presence of viral mutation(s) within the areas targeted by this assay, and inadequate number of viral copies (<250 copies / mL). A negative result must be combined with clinical observations, patient history, and epidemiological information.  Fact Sheet for Patients:   https://www.patel.info/  Fact Sheet for Healthcare Providers: https://hall.com/  This test is not yet approved or  cleared by the [place] FDA and has been authorized for detection and/or diagnosis of SARS-CoV-2 by FDA under an Emergency Use Authorization (EUA).  This EUA will remain in effect (meaning this test can be used) for the duration of the COVID-19 declaration under Section 564(b)(1) of the Act, 21 U.S.C. section 360bbb-3(b)(1), unless the authorization is terminated or revoked sooner.  Performed at [facility], [address] [place], [place] [address]   MRSA Next Gen by PCR, Nasal     Status: None   Collection Time: 07/08/21 10:38 PM   Specimen: Nasal Mucosa; Nasal Swab  Result Value Ref Range Status   MRSA by PCR Next Gen NOT DETECTED NOT DETECTED Final    Comment: (NOTE) The GeneXpert MRSA Assay (FDA approved for NASAL specimens only), is one component of a comprehensive MRSA colonization surveillance program. It is not intended to diagnose MRSA infection nor to  guide or monitor treatment for MRSA infections. Test performance is not FDA approved in patients less than 109 years old. Performed at [facility], [address], [place], [place] [address]          Radiology Studies: CT HEAD WO CONTRAST (5MM)  Result Date: 07/08/2021 CLINICAL DATA:  Headache, new or worsening (Age >= 50y). c/o headache for the past week relieved by excedrin and now c/o sob. Also states her BP has been elevated and has been taking her meds as prescribed" EXAM: CT HEAD WITHOUT CONTRAST TECHNIQUE: Contiguous axial images were obtained from the base of the skull through the vertex without intravenous contrast. RADIATION DOSE REDUCTION: This exam was performed according to the departmental dose-optimization program which includes automated exposure control, adjustment of the mA and/or kV according to patient size and/or use of iterative reconstruction technique. COMPARISON:  CT head 12/31/2015, MRI head 01/01/2016 FINDINGS: Brain: No evidence of large-territorial acute infarction. No parenchymal hemorrhage.  Interval increase in size of a 2.7 x 2.2 x 1.7 cm (from 1.4 x 1.5 cm) midline posterior fossa mass arising along the right tentorium. Mass is again noted to be partially calcified. No extra-axial collection. Associated mass effect on the vermis and bilateral cerebellar hemispheres. No hydrocephalus. Basilar cisterns are patent. Vascular: No hyperdense vessel. Skull: No acute fracture or focal lesion. Sinuses/Orbits: Paranasal sinuses and mastoid air cells are clear. The orbits are unremarkable. Other: None. IMPRESSION: Interval increase in size of a 2.7 x 2.2 cm (from 1.4 x 1.5 cm) midline posterior fossa meningioma arising along the right tentorium. Associated mass effect on the vermis and bilateral cerebellar hemispheres. Electronically Signed   By: Iven Finn M.D.   On: 07/08/2021 15:06   DG Chest Portable 1 View  Result Date: 07/08/2021 CLINICAL DATA:  [facility] reports pt coming from home c/o headache for the past week relieved by excedrin and now c/o sob. Also states her BP has been elevated and has been taking her meds as prescribed EXAM: PORTABLE CHEST 1 VIEW COMPARISON:  10/25/2020 and older studies. FINDINGS: Cardiac silhouette is normal in size. No mediastinal or hilar masses. Tunneled left anterior chest wall, internal jugular, central venous catheter is stable. Bilateral interstitial thickening has increased compared to the prior exam. There is mild intervening hazy airspace opacities. No visualized pleural effusion.  No pneumothorax. Skeletal structures are grossly intact. IMPRESSION: 1. Interstitial thickening and mild hazy airspace opacities consistent with pulmonary edema. Electronically Signed   By: Lajean Manes M.D.   On: 07/08/2021 14:49        Scheduled Meds:  aspirin EC  81 mg Oral Daily   Chlorhexidine Gluconate Cloth  6 each Topical Daily   cinacalcet  60 mg Oral Q lunch   dexamethasone  4 mg Oral Q8H   LORazepam  0.5 mg Oral Q1400   metoprolol succinate  50 mg Oral Daily    multivitamin  1 tablet Oral QHS   pantoprazole  40 mg Oral Daily   sacubitril-valsartan  1 tablet Oral BID   sertraline  150 mg Oral Daily   sevelamer carbonate  2,400 mg Oral TID with meals   Warfarin - Pharmacist Dosing Inpatient   Does not apply q1600   zolpidem  5 mg Oral QHS   Continuous Infusions:   LOS: 0 days    Time spent: 35 mins     Wyvonnia Dusky, MD [facility] Pager [phone]  If 7PM-7AM, please contact night-coverage [email or web address] 07/09/2021, 7:55 AM

## 2021-07-09 NOTE — Progress Notes (Signed)
[facility]  ROUNDING NOTE   Subjective:   Teresa Cook is a 66 year old African-American female with past medical conditions including hypertension, gout, anxiety with depression, sickle cell trait, systolic heart failure with a EF 40 to 45%, breast cancer with bilateral mastectomy, seizures, and end-stage renal disease on hemodialysis.  Patient presents to the emergency department with complaints of shortness of breath and headache.  Patient has been admitted under observation for Acute pulmonary edema ([place]) [J81.0] Fluid overload [E87.70] Hypervolemia, unspecified hypervolemia type [E87.70] Hypertension, unspecified type [I10]  Patient is known to our practice and receives outpatient dialysis treatments at [facility] on a MWF schedule, supervised by Dr. Candiss Norse.  Patient states she received a full treatment on Wednesday and Friday however no fluid was removed due to patient arriving at dry weight.  Patient states she requested a dialysis challenge however staff refused due to no additional fluid noted on dry weight.  Patient continues to states she has maintained fluid restriction and prescribed diet outpatient.  Denies nausea, vomiting, or diarrhea.  Denies cough.  Patient states she does have posterior headache.  Patient states she is known for having "hidden fluid" that is not picked up during preweights at dialysis clinic.  Usually these are the causes of her shortness of breath.  Labs on ED arrival unremarkable for renal patient.  Chest x-ray shows mild hazy airspace opacities consistent with pulmonary edema.  We have been consulted to provide dialysis needs during this admission.   Objective:  Vital signs in last 24 hours:  Temp:  [98.1 F (36.7 C)-98.9 F (37.2 C)] 98.7 F (37.1 C) (07/03 1117) Pulse Rate:  [79-92] 81 (07/03 1145) Resp:  [16-29] 22 (07/03 1145) BP: (143-219)/(94-125) 143/112 (07/03 1145) SpO2:  [93 %-98 %] 95 % (07/03 1145) Weight:   [54.4 kg-59.2 kg] 59.2 kg (07/02 2120)  Weight change:  Filed Weights   07/08/21 1408 07/08/21 2120  Weight: 54.4 kg 59.2 kg    Intake/Output: No intake/output data recorded.   Intake/Output this shift:  Total I/O In: 120 [P.O.:120] Out: -   Physical Exam: General: NAD, resting comfortably  Head: Normocephalic, atraumatic. Moist oral mucosal membranes  Eyes: Anicteric  Lungs:  Clear to auscultation, normal effort, room air  Heart: Regular rate and rhythm  Abdomen:  Soft, nontender  Extremities: No peripheral edema.  Neurologic: Nonfocal, moving all four extremities  Skin: No lesions  Access: Left PermCath    Basic Metabolic Panel: Recent Labs  Lab 07/08/21 1419 07/08/21 1705 07/09/21 0429  NA 143  --  140  K 3.4*  --  4.0  CL 101  --  104  CO2 28  --  27  GLUCOSE 120*  --  113*  BUN 44*  --  56*  CREATININE 9.20*  --  10.43*  CALCIUM 11.5*  --  10.7*  MG  --  2.3  --     Liver Function Tests: Recent Labs  Lab 07/08/21 1419  AST 27  ALT 23  ALKPHOS 66  BILITOT 0.7  PROT 7.4  ALBUMIN 3.9   No results for input(s): "LIPASE", "AMYLASE" in the last 168 hours. No results for input(s): "AMMONIA" in the last 168 hours.  CBC: Recent Labs  Lab 07/08/21 1419 07/09/21 0429  WBC 8.3 8.2  HGB 10.6* 9.0*  HCT 33.2* 27.3*  MCV 86.5 85.8  PLT 200 180    Cardiac Enzymes: No results for input(s): "CKTOTAL", "CKMB", "CKMBINDEX", "TROPONINI" in the last 168 hours.  BNP: Invalid input(s): "POCBNP"  CBG: No results for input(s): "GLUCAP" in the last 168 hours.  Microbiology: Results for orders placed or performed during the hospital encounter of 07/08/21  SARS Coronavirus 2 by RT PCR (hospital order, performed in [facility] hospital lab) *cepheid single result test* Anterior Nasal Swab     Status: None   Collection Time: 07/08/21  2:19 PM   Specimen: Anterior Nasal Swab  Result Value Ref Range Status   SARS Coronavirus 2 by RT PCR NEGATIVE NEGATIVE  Final    Comment: (NOTE) SARS-CoV-2 target nucleic acids are NOT DETECTED.  The SARS-CoV-2 RNA is generally detectable in upper and lower respiratory specimens during the acute phase of infection. The lowest concentration of SARS-CoV-2 viral copies this assay can detect is 250 copies / mL. A negative result does not preclude SARS-CoV-2 infection and should not be used as the sole basis for treatment or other patient management decisions.  A negative result may occur with improper specimen collection / handling, submission of specimen other than nasopharyngeal swab, presence of viral mutation(s) within the areas targeted by this assay, and inadequate number of viral copies (<250 copies / mL). A negative result must be combined with clinical observations, patient history, and epidemiological information.  Fact Sheet for Patients:   https://www.patel.info/  Fact Sheet for Healthcare Providers: https://hall.com/  This test is not yet approved or  cleared by the [place] FDA and has been authorized for detection and/or diagnosis of SARS-CoV-2 by FDA under an Emergency Use Authorization (EUA).  This EUA will remain in effect (meaning this test can be used) for the duration of the COVID-19 declaration under Section 564(b)(1) of the Act, 21 U.S.C. section 360bbb-3(b)(1), unless the authorization is terminated or revoked sooner.  Performed at [facility], [address] [place], [place] [address]   MRSA Next Gen by PCR, Nasal     Status: None   Collection Time: 07/08/21 10:38 PM   Specimen: Nasal Mucosa; Nasal Swab  Result Value Ref Range Status   MRSA by PCR Next Gen NOT DETECTED NOT DETECTED Final    Comment: (NOTE) The GeneXpert MRSA Assay (FDA approved for NASAL specimens only), is one component of a comprehensive MRSA colonization surveillance program. It is not intended to diagnose MRSA infection nor to guide or monitor  treatment for MRSA infections. Test performance is not FDA approved in patients less than 22 years old. Performed at [facility], [address] [place], [place] [address]     Coagulation Studies: Recent Labs    07/08/21 1419 07/09/21 0429  LABPROT 21.0* 25.2*  INR 1.8* 2.3*    Urinalysis: No results for input(s): "COLORURINE", "LABSPEC", "PHURINE", "GLUCOSEU", "HGBUR", "BILIRUBINUR", "KETONESUR", "PROTEINUR", "UROBILINOGEN", "NITRITE", "LEUKOCYTESUR" in the last 72 hours.  Invalid input(s): "APPERANCEUR"    Imaging: CT HEAD WO CONTRAST (5MM)  Result Date: 07/08/2021 CLINICAL DATA:  Headache, new or worsening (Age >= 50y). c/o headache for the past week relieved by excedrin and now c/o sob. Also states her BP has been elevated and has been taking her meds as prescribed" EXAM: CT HEAD WITHOUT CONTRAST TECHNIQUE: Contiguous axial images were obtained from the base of the skull through the vertex without intravenous contrast. RADIATION DOSE REDUCTION: This exam was performed according to the departmental dose-optimization program which includes automated exposure control, adjustment of the mA and/or kV according to patient size and/or use of iterative reconstruction technique. COMPARISON:  CT head 12/31/2015, MRI head 01/01/2016 FINDINGS: Brain: No evidence of  large-territorial acute infarction. No parenchymal hemorrhage. Interval increase in size of a 2.7 x 2.2 x 1.7 cm (from 1.4 x 1.5 cm) midline posterior fossa mass arising along the right tentorium. Mass is again noted to be partially calcified. No extra-axial collection. Associated mass effect on the vermis and bilateral cerebellar hemispheres. No hydrocephalus. Basilar cisterns are patent. Vascular: No hyperdense vessel. Skull: No acute fracture or focal lesion. Sinuses/Orbits: Paranasal sinuses and mastoid air cells are clear. The orbits are unremarkable. Other: None. IMPRESSION: Interval increase in size of a 2.7 x 2.2 cm  (from 1.4 x 1.5 cm) midline posterior fossa meningioma arising along the right tentorium. Associated mass effect on the vermis and bilateral cerebellar hemispheres. Electronically Signed   By: Iven Finn M.D.   On: 07/08/2021 15:06   DG Chest Portable 1 View  Result Date: 07/08/2021 CLINICAL DATA:  [facility] reports pt coming from home c/o headache for the past week relieved by excedrin and now c/o sob. Also states her BP has been elevated and has been taking her meds as prescribed EXAM: PORTABLE CHEST 1 VIEW COMPARISON:  10/25/2020 and older studies. FINDINGS: Cardiac silhouette is normal in size. No mediastinal or hilar masses. Tunneled left anterior chest wall, internal jugular, central venous catheter is stable. Bilateral interstitial thickening has increased compared to the prior exam. There is mild intervening hazy airspace opacities. No visualized pleural effusion.  No pneumothorax. Skeletal structures are grossly intact. IMPRESSION: 1. Interstitial thickening and mild hazy airspace opacities consistent with pulmonary edema. Electronically Signed   By: Lajean Manes M.D.   On: 07/08/2021 14:49     Medications:     aspirin EC  81 mg Oral Daily   Chlorhexidine Gluconate Cloth  6 each Topical Daily   cinacalcet  60 mg Oral Q lunch   dexamethasone  4 mg Oral Q8H   LORazepam  0.5 mg Oral Q1400   metoprolol succinate  50 mg Oral Daily   multivitamin  1 tablet Oral QHS   pantoprazole  40 mg Oral Daily   sacubitril-valsartan  1 tablet Oral BID   sertraline  150 mg Oral Daily   sevelamer carbonate  2,400 mg Oral TID with meals   warfarin  5 mg Oral ONCE-1600   Warfarin - Pharmacist Dosing Inpatient   Does not apply q1600   zolpidem  5 mg Oral QHS   acetaminophen, acetaminophen-caffeine, albuterol, alteplase, dextromethorphan-guaiFENesin, heparin, hydrALAZINE, lidocaine (PF), lidocaine-prilocaine, loperamide, ondansetron (ZOFRAN) IV, mouth rinse, pentafluoroprop-tetrafluoroeth  Assessment/  Plan:  Ms. Versa Craton is a 66 y.o.  female with past medical conditions including hypertension, gout, anxiety with depression, sickle cell trait, systolic heart failure with a EF 40 to 45%, breast cancer with bilateral mastectomy, seizures, and end-stage renal disease on hemodialysis.  Patient presents to the emergency department with complaints of shortness of breath and headache.  Patient has been admitted under observation for Acute pulmonary edema ([place]) [J81.0] Fluid overload [E87.70] Hypervolemia, unspecified hypervolemia type [E87.70] Hypertension, unspecified type [I10]  [facility]/MWF/left PermCath/57 kg  Acute respiratory distress believed due to fluid overload with end-stage renal disease on hemodialysis.  Patient received treatment on Friday at outpatient clinic, no UF.  Will provide scheduled dialysis treatment today, with UF challenge to determine adjustment in dry weight.  2. Anemia of chronic kidney disease Lab Results  Component Value Date   HGB 9.0 (L) 07/09/2021    Hemoglobin at acceptable target.  Patient receives [place] outpatient.  We will continue to monitor.  3. Secondary Hyperparathyroidism:  Lab Results  Component Value Date   PTH 344 (H) 01/23/2020   CALCIUM 10.7 (H) 07/09/2021   PHOS 5.8 (H) 10/26/2020    Calcium and phosphorus slightly elevated.  Continue Cinacalcet and sevelamer with meals at this time.  Will monitor.  4.  Hypertension with chronic kidney disease.  Home regimen includes Entresto and metoprolol.  Currently receiving these medications while admitted.  Current blood pressure 144/94 during dialysis.    LOS: 0 Decoda Van 7/3/202312:09 PM

## 2021-07-09 NOTE — Progress Notes (Addendum)
[place] for warfarin Indication: afib/ history of DVT  Allergies  Allergen Reactions   Gabapentin Other (See Comments)    Seizure   Adhesive [Tape] Itching    Silk tape is ok to use.    Patient Measurements: Height: '5\' 5"'$  (165.1 cm) Weight: 59.2 kg (130 lb 8.2 oz) IBW/kg (Calculated) : 57  Vital Signs: Temp: 98.5 F (36.9 C) (07/03 0800) Temp Source: Oral (07/03 0800) BP: 163/106 (07/03 0800) Pulse Rate: 91 (07/03 0800)  Labs: Recent Labs    07/08/21 1419 07/08/21 1705 07/08/21 2203 07/09/21 0429  HGB 10.6*  --   --  9.0*  HCT 33.2*  --   --  27.3*  PLT 200  --   --  180  LABPROT 21.0*  --   --  25.2*  INR 1.8*  --   --  2.3*  CREATININE 9.20*  --   --  10.43*  TROPONINIHS 37* 43* 60*  --      Estimated Creatinine Clearance: 4.7 mL/min (A) (by C-G formula based on SCr of 10.43 mg/dL (H)).   Medical History: Past Medical History:  Diagnosis Date   Anemia    Anxiety    Breast cancer ([place]) 01/2016   bilateral   CHF (congestive heart failure) (HCC)    Chronic kidney disease    Depression    Dialysis patient [facility])    DVT (deep venous thrombosis) ([place])    left leg   DVT (deep venous thrombosis) ([place]) 1985   right thigh   Dysrhythmia    Gout    Headache    HTN (hypertension)    Hypertension    Parathyroid abnormality (HCC)    Parathyroid disease ([place])    Pneumonia 12/2015   Psoriasis    Renal insufficiency    Sickle cell trait (HCC)    traits   Medications: PTA warfarin: Take 1 tablet ('5mg'$ ) by mouth every Monday, Wednesday, Friday, Saturday and Sunday evening and take 1.5  tablets (7.'5mg'$ ) by mouth every Tuesday and Thursday evening. Last PTA dose 07/07/21 at 1930  Assessment: 66 y.o. female with history of DVT, afib, and ESRD on MWF HD taking warfarin PTA, presenting with headache. Pharmacy has been consulted for warfarin dosing.  At admission: Hgb low at 10.6, but platelets WNL. No overt bleeding noted.  Baseline INR 1.8. Per patient, she gets her INR normally check with dialysis and has the results called in to her MD office. The patient states she had an INR of "2 point something" last week, but I am unable to confirm that in her chart.   Date INR Warfarin Dose  7/1 -- 5 mg (PTA) 7/2 1.8 7.5 7/3 2.3 5 mg   Goal of Therapy:  Maintain INR between 2-3 Monitor platelets by anticoagulation protocol: Yes  Plan:  Warfarin therapeutic (INR goal 2-3) Warfarin 5 mg x 1 ordered for tonight (home dose) Monitor CBC, daily PT/INR Continue to monitor for signs/symptoms of bleeding   Dorothe Pea, PharmD, BCPS Clinical Pharmacist

## 2021-07-09 NOTE — Care Management Obs Status (Signed)
[facility] NOTIFICATION   Patient Details  Name: Machaela Caterino MRN: [id number] Date of Birth: 07/16/1955   Medicare Observation Status Notification Given:  Yes    Beverly Sessions, RN 07/09/2021, 4:04 PM

## 2021-07-10 DIAGNOSIS — E8779 Other fluid overload: Secondary | ICD-10-CM | POA: Diagnosis not present

## 2021-07-10 DIAGNOSIS — N186 End stage renal disease: Secondary | ICD-10-CM | POA: Diagnosis not present

## 2021-07-10 DIAGNOSIS — Z992 Dependence on renal dialysis: Secondary | ICD-10-CM | POA: Diagnosis not present

## 2021-07-10 DIAGNOSIS — I5022 Chronic systolic (congestive) heart failure: Secondary | ICD-10-CM | POA: Diagnosis not present

## 2021-07-10 LAB — HEMOGLOBIN A1C
Hgb A1c MFr Bld: 4.9 % (ref 4.8–5.6)
Mean Plasma Glucose: 94 mg/dL

## 2021-07-10 LAB — CBC
HCT: 28.5 % — ABNORMAL LOW (ref 36.0–46.0)
Hemoglobin: 9.6 g/dL — ABNORMAL LOW (ref 12.0–15.0)
MCH: 28.6 pg (ref 26.0–34.0)
MCHC: 33.7 g/dL (ref 30.0–36.0)
MCV: 84.8 fL (ref 80.0–100.0)
Platelets: 168 10*3/uL (ref 150–400)
RBC: 3.36 MIL/uL — ABNORMAL LOW (ref 3.87–5.11)
RDW: 18.4 % — ABNORMAL HIGH (ref 11.5–15.5)
WBC: 8.1 10*3/uL (ref 4.0–10.5)
nRBC: 0 % (ref 0.0–0.2)

## 2021-07-10 LAB — BASIC METABOLIC PANEL
Anion gap: 11 (ref 5–15)
BUN: 32 mg/dL — ABNORMAL HIGH (ref 8–23)
CO2: 29 mmol/L (ref 22–32)
Calcium: 10.7 mg/dL — ABNORMAL HIGH (ref 8.9–10.3)
Chloride: 97 mmol/L — ABNORMAL LOW (ref 98–111)
Creatinine, Ser: 6.79 mg/dL — ABNORMAL HIGH (ref 0.44–1.00)
GFR, Estimated: 6 mL/min — ABNORMAL LOW (ref >60)
Glucose, Bld: 118 mg/dL — ABNORMAL HIGH (ref 70–99)
Potassium: 3.8 mmol/L (ref 3.5–5.1)
Sodium: 137 mmol/L (ref 135–145)

## 2021-07-10 LAB — CALCITRIOL (1,25 DI-OH VIT D): Vit D, 1,25-Dihydroxy: 31.8 pg/mL (ref 24.8–81.5)

## 2021-07-10 LAB — HEPATITIS B SURFACE ANTIBODY, QUANTITATIVE: Hep B S AB Quant (Post): 9.2 m[IU]/mL — ABNORMAL LOW (ref >9.9)

## 2021-07-10 LAB — PROTIME-INR
INR: 2.7 — ABNORMAL HIGH (ref 0.8–1.2)
Prothrombin Time: 28.6 seconds — ABNORMAL HIGH (ref 11.4–15.2)

## 2021-07-10 LAB — PARATHYROID HORMONE, INTACT (NO CA): PTH: 214 pg/mL — ABNORMAL HIGH (ref 15–65)

## 2021-07-10 MED ORDER — WARFARIN SODIUM 5 MG PO TABS
5.0000 mg | ORAL_TABLET | Freq: Once | ORAL | Status: DC
Start: 2021-07-10 — End: 2021-07-10
  Filled 2021-07-10: qty 1

## 2021-07-10 MED ORDER — TRAZODONE HCL 50 MG PO TABS: 50.0000 mg | ORAL_TABLET | Freq: Every day | ORAL | Status: DC

## 2021-07-10 NOTE — Progress Notes (Signed)
       CROSS COVER NOTE  NAME: Teresa Cook MRN: [id number] DOB : 10-03-1955    Date of Service   07/09/21  HPI/Events of Note   Secure chat received from nursing "Patient asking for trazodone '50mg'$  she takes every night at home please"  "She came out in the hall a little bit ago she is still upset about not getting anything to help her sleep in addition to the Lorrin Mais is there anything else she could get? I told her I would ask again if she was awake in an hour and here we are haha"  Interventions   Plan: Melatonin     This document was prepared using Dragon voice recognition software and may include unintentional dictation errors.  Neomia Glass DNP, MHA, FNP-BC Nurse Practitioner [facility] Pager [phone]

## 2021-07-10 NOTE — Plan of Care (Signed)

## 2021-07-10 NOTE — Progress Notes (Signed)
Pt was given discharge instructions. Questions were encourage and answered. VSS. Pt denies pain at this time. Belongings were given to pt's family. IV was taken out.

## 2021-07-10 NOTE — Progress Notes (Signed)
[place] for warfarin Indication: afib/ history of DVT  Allergies  Allergen Reactions   Gabapentin Other (See Comments)    Seizure   Adhesive [Tape] Itching    Silk tape is ok to use.    Patient Measurements: Height: '5\' 5"'$  (165.1 cm) Weight: 58 kg (127 lb 13.9 oz) IBW/kg (Calculated) : 57  Vital Signs: Temp: 98.8 F (37.1 C) (07/04 0842) Temp Source: Oral (07/04 0609) BP: 160/103 (07/04 0842) Pulse Rate: 83 (07/04 0842)  Labs: Recent Labs    07/08/21 1419 07/08/21 1705 07/08/21 2203 07/09/21 0429 07/10/21 0517  HGB 10.6*  --   --  9.0* 9.6*  HCT 33.2*  --   --  27.3* 28.5*  PLT 200  --   --  180 168  LABPROT 21.0*  --   --  25.2* 28.6*  INR 1.8*  --   --  2.3* 2.7*  CREATININE 9.20*  --   --  10.43* 6.79*  TROPONINIHS 37* 43* 60*  --   --      Estimated Creatinine Clearance: 7.2 mL/min (A) (by C-G formula based on SCr of 6.79 mg/dL (H)).   Medical History: Past Medical History:  Diagnosis Date   Anemia    Anxiety    Breast cancer ([place]) 01/2016   bilateral   CHF (congestive heart failure) (HCC)    Chronic kidney disease    Depression    Dialysis patient [facility])    DVT (deep venous thrombosis) ([place])    left leg   DVT (deep venous thrombosis) ([place]) 1985   right thigh   Dysrhythmia    Gout    Headache    HTN (hypertension)    Hypertension    Parathyroid abnormality (HCC)    Parathyroid disease ([place])    Pneumonia 12/2015   Psoriasis    Renal insufficiency    Sickle cell trait (HCC)    traits   Medications: PTA warfarin: Take 1 tablet ('5mg'$ ) by mouth every Monday, Wednesday, Friday, Saturday and Sunday evening and take 1.5  tablets (7.'5mg'$ ) by mouth every Tuesday and Thursday evening. Last PTA dose 07/07/21 at 1930  Assessment: 66 y.o. female with history of DVT, afib, and ESRD on MWF HD taking warfarin PTA, presenting with headache. Pharmacy has been consulted for warfarin dosing.  At admission: Hgb low at 10.6,  but platelets WNL. No overt bleeding noted. Baseline INR 1.8. Per patient, she gets her INR normally check with dialysis and has the results called in to her MD office. The patient states she had an INR of "2 point something" last week, but I am unable to confirm that in her chart.   Date INR Warfarin Dose  7/1 -- 5 mg (PTA) 7/2 1.8 7.5 7/3 2.3 5 mg  7/4 2.7  DDI : ASA, note on dexamethasone  Goal of Therapy:  Maintain INR between 2-3 Monitor platelets by anticoagulation protocol: Yes  Plan:  Warfarin therapeutic (INR goal 2-3) Warfarin 5 mg x 1 ordered for tonight again Monitor CBC, daily PT/INR Continue to monitor for signs/symptoms of bleeding   Chinita Greenland PharmD Clinical Pharmacist 07/10/2021

## 2021-07-10 NOTE — Discharge Summary (Signed)
Physician Discharge Summary  Keron Koffman EYC:[id number] DOB: [birth year] DOA: 07/08/2021  PCP: [facility] [place] date: 07/08/2021 Discharge date: 07/10/2021  Admitted From: home  Disposition:  home   Recommendations for Outpatient Follow-up:  Follow up with PCP in 1-2 weeks F/u w/ neuro surg, Dr. Tamala Julian, in 2-4 weeks F/u w/ nephro, Dr. Candiss Norse, in 1-2 weeks   Home Health: no  Equipment/Devices:  Discharge Condition: stable  CODE STATUS: full  Diet recommendation: Heart Healthy  Brief/Interim Summary: Teresa Cook is a 66 y.o. female with medical history significant of ESRD-HD (MWF), hypertension, gout, depression with anxiety, sickle cell trait, DVT on Coumadin, sCHF with EF 40-45%, bilateral breast cancer (s/p of bilateral mastectomy), seizure not on medications currently, meningioma, hyperparathyroidism, who SOB and headache.   Patient states that she has shortness of breath in the past 2 days, which has been progressively worsening.  Patient has dry cough, denies chest pain, no fever or chills.  Oxygen saturation 90% on room air.  Patient does not have nausea, vomiting, diarrhea or abdominal pain.  Patient states that she has chronic headache, which has worsened recently.  The headache is located in the occipital area, constant, 10 out of 10 in severity, nonradiating.  No unilateral numbness or tinglings in extremities.  No facial droop or slurred speech.  No confusion.   Data reviewed independently and ED Course: pt was found to have WBC 8.3, troponin level 37, INR 1.8, negative COVID PCR, potassium 3.4, bicarbonate 28, creatinine 9.20, BUN 44, calcium 11.5, temperature normal, blood pressure 219/103, heart rate 92, RR 28, oxygen saturation 90% on room air, which improved to 93-95% on 2 L oxygen.  Chest x-ray showed interstitial edema.  CT of the head showed enlargement of meningioma with mass effect.  Patient is placed on telemetry bed for position, Dr. Holley Raring  of renal and Dr. Tamala Julian of neurosurgery are consulted.     CT-head: Interval increase in size of a 2.7 x 2.2 cm (from 1.4 x 1.5 cm) midline posterior fossa meningioma arising along the right tentorium. Associated mass effect on the vermis and bilateral cerebellar hemispheres.       EKG: I have personally reviewed.  Sinus rhythm, QTc 446, LAE, LVH, Q-wave in lead III, anteroseptal infarction pattern, mild ST depression in V5-V6.  As per Dr. Jimmye Norman 7/3-07/10/21: Pt presented w/ fluid overload and received HD. Receiving HD w/ UF improved pt's symptoms. Pt was ambulating and transferring independently so therapy was not needed. Of note, pt was found to have enlarging meningoma for which neuro surg evaluated the pt. Pt will need to f/u outpatient w/ neuro surg, Dr. Tamala Julian, in 2-4 weeks and get MRI brain. Pt verbalized her understanding   Discharge Diagnoses:  Principal Problem:   Fluid overload Active Problems:   Chronic systolic CHF (congestive heart failure) (HCC)   ESRD on dialysis (HCC)   Anemia in ESRD (end-stage renal disease) (HCC)   HTN (hypertension)   Meningioma, cerebral (HCC)   Headache   Parathyroid disease (HCC)   Hypercalcemia   DVT (deep venous thrombosis) ([place])   Depression with anxiety   Hypokalemia   Myocardial injury  Fluid overload: has positive JVD, crackles on auscultation & interstitial edema on CXR. S/p HD. Much improved.  Nephro following and recs apprec    Chronic systolic CHF: echo on 1/49/7026 showed EF of 40-45%.  Volume management w/ HD    ESRD: on HD. Nephro recs apprec   ACD: likely secondary to ESRD.  HTN: continue on metoprolol, entresto. IV hydralazine prn    Hx of meningioma: CT of head today showed increased size of meningioma with some mass effect.  Mental status normal.  D/c steroids as per neuro surg.  Neuro surg following    Headache: w/ hx of meningioma. Tylenol prn    Parathyroid disease: w/ hypercalcemia. Management w/ HD  DVT:  continue on warfarin. Pharmacy to dose & monitor    Depression: severity unknown. Continue on home dose of sertraline    Hypokalemia: WNL today    Myocardial injury: elevated troponins secondary to demand ischemia. Continue on aspirin   Insomnia: restart home dose of trazodone   Discharge Instructions  Discharge Instructions     Diet - low sodium heart healthy   Complete by: As directed    Discharge instructions   Complete by: As directed    F/u w/ PCP in 1-2 weeks. F/u w/ nephro, Dr. Candiss Norse, in 1-2 weeks. F/u w/ neuro surg, Dr. Tamala Julian or another neuro surg on your choice in 2-4 weeks   Increase activity slowly   Complete by: As directed       Allergies as of 07/10/2021       Reactions   Gabapentin Other (See Comments)   Seizure   Adhesive [tape] Itching   Silk tape is ok to use.        Medication List     TAKE these medications    albuterol 108 (90 Base) MCG/ACT inhaler Commonly known as: VENTOLIN HFA Inhale 2 puffs into the lungs every 4 (four) hours as needed for wheezing or shortness of breath.   aspirin-acetaminophen-caffeine 250-250-65 MG tablet Commonly known as: EXCEDRIN MIGRAINE Take 1-2 tablets by mouth every 6 (six) hours as needed for headache.   b complex-vitamin c-folic acid 0.8 MG Tabs tablet Take 1 tablet by mouth daily.   cinacalcet 60 MG tablet Commonly known as: SENSIPAR Take 60 mg by mouth daily before lunch.   Entresto 24-26 MG Generic drug: sacubitril-valsartan Take 1 tablet by mouth 2 (two) times daily.   loperamide 2 MG capsule Commonly known as: IMODIUM Take 1 capsule (2 mg total) by mouth 2 (two) times daily as needed for diarrhea or loose stools.   LORazepam 0.5 MG tablet Commonly known as: ATIVAN Take 0.5 mg by mouth daily.   metoprolol succinate 50 MG 24 hr tablet Commonly known as: TOPROL-XL Take 1 tablet (50 mg total) by mouth daily. Take with or immediately following a meal.   pantoprazole 40 MG tablet Commonly known  as: PROTONIX Take 1 tablet (40 mg total) by mouth daily.   sertraline 100 MG tablet Commonly known as: ZOLOFT Take 1.5 tablets (150 mg total) by mouth daily.   sevelamer carbonate 800 MG tablet Commonly known as: RENVELA Take 800-2,400 mg by mouth See admin instructions. Take 3 tablets ('2400mg'$ ) by mouth three times daily before meals and take 1 tablet ('800mg'$ ) by mouth daily before a snack   traZODone 50 MG tablet Commonly known as: DESYREL Take 50 mg by mouth at bedtime.   warfarin 5 MG tablet Commonly known as: COUMADIN Take 1-1.5 tablets (5-7.5 mg total) by mouth See admin instructions. Take 1 tablet ('5mg'$ ) by mouth every Monday, Wednesday, Friday, Saturday and Sunday evening and take 1.5  tablets (7.'5mg'$ ) by mouth every Tuesday and Thursday evening   zolpidem 10 MG tablet Commonly known as: AMBIEN Take 1 tablet (10 mg total) by mouth at bedtime.        Allergies  Allergen Reactions   Gabapentin Other (See Comments)    Seizure   Adhesive [Tape] Itching    Silk tape is ok to use.    Consultations: Neuro surg Nephro    Procedures/Studies: CT HEAD WO CONTRAST (5MM)  Result Date: 07/08/2021 CLINICAL DATA:  Headache, new or worsening (Age >= 50y). c/o headache for the past week relieved by excedrin and now c/o sob. Also states her BP has been elevated and has been taking her meds as prescribed" EXAM: CT HEAD WITHOUT CONTRAST TECHNIQUE: Contiguous axial images were obtained from the base of the skull through the vertex without intravenous contrast. RADIATION DOSE REDUCTION: This exam was performed according to the departmental dose-optimization program which includes automated exposure control, adjustment of the mA and/or kV according to patient size and/or use of iterative reconstruction technique. COMPARISON:  CT head 12/31/2015, MRI head 01/01/2016 FINDINGS: Brain: No evidence of large-territorial acute infarction. No parenchymal hemorrhage. Interval increase in size of a 2.7 x  2.2 x 1.7 cm (from 1.4 x 1.5 cm) midline posterior fossa mass arising along the right tentorium. Mass is again noted to be partially calcified. No extra-axial collection. Associated mass effect on the vermis and bilateral cerebellar hemispheres. No hydrocephalus. Basilar cisterns are patent. Vascular: No hyperdense vessel. Skull: No acute fracture or focal lesion. Sinuses/Orbits: Paranasal sinuses and mastoid air cells are clear. The orbits are unremarkable. Other: None. IMPRESSION: Interval increase in size of a 2.7 x 2.2 cm (from 1.4 x 1.5 cm) midline posterior fossa meningioma arising along the right tentorium. Associated mass effect on the vermis and bilateral cerebellar hemispheres. Electronically Signed   By: Iven Finn M.D.   On: 07/08/2021 15:06   DG Chest Portable 1 View  Result Date: 07/08/2021 CLINICAL DATA:  [facility] reports pt coming from home c/o headache for the past week relieved by excedrin and now c/o sob. Also states her BP has been elevated and has been taking her meds as prescribed EXAM: PORTABLE CHEST 1 VIEW COMPARISON:  10/25/2020 and older studies. FINDINGS: Cardiac silhouette is normal in size. No mediastinal or hilar masses. Tunneled left anterior chest wall, internal jugular, central venous catheter is stable. Bilateral interstitial thickening has increased compared to the prior exam. There is mild intervening hazy airspace opacities. No visualized pleural effusion.  No pneumothorax. Skeletal structures are grossly intact. IMPRESSION: 1. Interstitial thickening and mild hazy airspace opacities consistent with pulmonary edema. Electronically Signed   By: Lajean Manes M.D.   On: 07/08/2021 14:49   (Echo, Carotid, EGD, Colonoscopy, ERCP)    Subjective: Pt c/o poor customer service    Discharge Exam: Vitals:   07/10/21 0842 07/10/21 1500  BP: (!) 160/103 (!) 155/89  Pulse: 83 89  Resp: 18 16  Temp: 98.8 F (37.1 C)   SpO2: 91%    Vitals:   07/10/21 0444 07/10/21 0609  07/10/21 0842 07/10/21 1500  BP:  (!) 160/93 (!) 160/103 (!) 155/89  Pulse:  94 83 89  Resp:  '18 18 16  '$ Temp:  98.5 F (36.9 C) 98.8 F (37.1 C)   TempSrc:  Oral    SpO2:  94% 91%   Weight: 58 kg     Height:        General: Pt is alert, awake, not in acute distress Cardiovascular:  S1/S2 +, no rubs, no gallops Respiratory: decreased breath sounds b/l otherwise clear  Abdominal: Soft, NT, ND, bowel sounds + Extremities: no edema, no cyanosis    The results of significant  diagnostics from this hospitalization (including imaging, microbiology, ancillary and laboratory) are listed below for reference.     Microbiology: Recent Results (from the past 240 hour(s))  SARS Coronavirus 2 by RT PCR (hospital order, performed in [facility] hospital lab) *cepheid single result test* Anterior Nasal Swab     Status: None   Collection Time: 07/08/21  2:19 PM   Specimen: Anterior Nasal Swab  Result Value Ref Range Status   SARS Coronavirus 2 by RT PCR NEGATIVE NEGATIVE Final    Comment: (NOTE) SARS-CoV-2 target nucleic acids are NOT DETECTED.  The SARS-CoV-2 RNA is generally detectable in upper and lower respiratory specimens during the acute phase of infection. The lowest concentration of SARS-CoV-2 viral copies this assay can detect is 250 copies / mL. A negative result does not preclude SARS-CoV-2 infection and should not be used as the sole basis for treatment or other patient management decisions.  A negative result may occur with improper specimen collection / handling, submission of specimen other than nasopharyngeal swab, presence of viral mutation(s) within the areas targeted by this assay, and inadequate number of viral copies (<250 copies / mL). A negative result must be combined with clinical observations, patient history, and epidemiological information.  Fact Sheet for Patients:   https://www.patel.info/  Fact Sheet for Healthcare  Providers: https://hall.com/  This test is not yet approved or  cleared by the [place] FDA and has been authorized for detection and/or diagnosis of SARS-CoV-2 by FDA under an Emergency Use Authorization (EUA).  This EUA will remain in effect (meaning this test can be used) for the duration of the COVID-19 declaration under Section 564(b)(1) of the Act, 21 U.S.C. section 360bbb-3(b)(1), unless the authorization is terminated or revoked sooner.  Performed at [facility] [address] [place], [place] [address]   MRSA Next Gen by PCR, Nasal     Status: None   Collection Time: 07/08/21 10:38 PM   Specimen: Nasal Mucosa; Nasal Swab  Result Value Ref Range Status   MRSA by PCR Next Gen NOT DETECTED NOT DETECTED Final    Comment: (NOTE) The GeneXpert MRSA Assay (FDA approved for NASAL specimens only), is one component of a comprehensive MRSA colonization surveillance program. It is not intended to diagnose MRSA infection nor to guide or monitor treatment for MRSA infections. Test performance is not FDA approved in patients less than 66 years old. Performed at [facility], [address] [place], [place] [address]      Labs: BNP (last 3 results) Recent Labs    10/25/20 0523  BNP 098.1*   Basic Metabolic Panel: Recent Labs  Lab 07/08/21 1419 07/08/21 1705 07/09/21 0429 07/10/21 0517  NA 143  --  140 137  K 3.4*  --  4.0 3.8  CL 101  --  104 97*  CO2 28  --  27 29  GLUCOSE 120*  --  113* 118*  BUN 44*  --  56* 32*  CREATININE 9.20*  --  10.43* 6.79*  CALCIUM 11.5*  --  10.7* 10.7*  MG  --  2.3  --   --    Liver Function Tests: Recent Labs  Lab 07/08/21 1419  AST 27  ALT 23  ALKPHOS 66  BILITOT 0.7  PROT 7.4  ALBUMIN 3.9   No results for input(s): "LIPASE", "AMYLASE" in the last 168 hours. No results for input(s): "AMMONIA" in the last 168 hours. CBC: Recent Labs  Lab 07/08/21 1419 07/09/21 0429  07/10/21  0517  WBC 8.3 8.2 8.1  HGB 10.6* 9.0* 9.6*  HCT 33.2* 27.3* 28.5*  MCV 86.5 85.8 84.8  PLT 200 180 168   Cardiac Enzymes: No results for input(s): "CKTOTAL", "CKMB", "CKMBINDEX", "TROPONINI" in the last 168 hours. BNP: Invalid input(s): "POCBNP" CBG: No results for input(s): "GLUCAP" in the last 168 hours. D-Dimer No results for input(s): "DDIMER" in the last 72 hours. Hgb A1c Recent Labs    07/08/21 1705  HGBA1C 4.9   Lipid Profile Recent Labs    07/09/21 0429  CHOL 178  HDL 57  LDLCALC 113*  TRIG 41  CHOLHDL 3.1   Thyroid function studies No results for input(s): "TSH", "T4TOTAL", "T3FREE", "THYROIDAB" in the last 72 hours.  Invalid input(s): "FREET3" Anemia work up No results for input(s): "VITAMINB12", "FOLATE", "FERRITIN", "TIBC", "IRON", "RETICCTPCT" in the last 72 hours. Urinalysis    Component Value Date/Time   COLORURINE STRAW (A) 09/09/2016 1521   APPEARANCEUR CLEAR (A) 09/09/2016 1521   APPEARANCEUR Clear 09/08/2013 0133   LABSPEC 1.008 09/09/2016 1521   LABSPEC 1.013 09/08/2013 0133   PHURINE 8.0 09/09/2016 1521   GLUCOSEU 50 (A) 09/09/2016 1521   GLUCOSEU 50 mg/dL 09/08/2013 0133   HGBUR NEGATIVE 09/09/2016 1521   BILIRUBINUR NEGATIVE 09/09/2016 1521   BILIRUBINUR Negative 09/08/2013 0133   KETONESUR NEGATIVE 09/09/2016 1521   PROTEINUR 100 (A) 09/09/2016 1521   NITRITE NEGATIVE 09/09/2016 1521   LEUKOCYTESUR NEGATIVE 09/09/2016 1521   LEUKOCYTESUR Negative 09/08/2013 0133   Sepsis Labs Recent Labs  Lab 07/08/21 1419 07/09/21 0429 07/10/21 0517  WBC 8.3 8.2 8.1   Microbiology Recent Results (from the past 240 hour(s))  SARS Coronavirus 2 by RT PCR (hospital order, performed in [place] hospital lab) *cepheid single result test* Anterior Nasal Swab     Status: None   Collection Time: 07/08/21  2:19 PM   Specimen: Anterior Nasal Swab  Result Value Ref Range Status   SARS Coronavirus 2 by RT PCR NEGATIVE NEGATIVE Final     Comment: (NOTE) SARS-CoV-2 target nucleic acids are NOT DETECTED.  The SARS-CoV-2 RNA is generally detectable in upper and lower respiratory specimens during the acute phase of infection. The lowest concentration of SARS-CoV-2 viral copies this assay can detect is 250 copies / mL. A negative result does not preclude SARS-CoV-2 infection and should not be used as the sole basis for treatment or other patient management decisions.  A negative result may occur with improper specimen collection / handling, submission of specimen other than nasopharyngeal swab, presence of viral mutation(s) within the areas targeted by this assay, and inadequate number of viral copies (<250 copies / mL). A negative result must be combined with clinical observations, patient history, and epidemiological information.  Fact Sheet for Patients:   https://www.patel.info/  Fact Sheet for Healthcare Providers: https://hall.com/  This test is not yet approved or  cleared by the [place] FDA and has been authorized for detection and/or diagnosis of SARS-CoV-2 by FDA under an Emergency Use Authorization (EUA).  This EUA will remain in effect (meaning this test can be used) for the duration of the COVID-19 declaration under Section 564(b)(1) of the Act, 21 U.S.C. section 360bbb-3(b)(1), unless the authorization is terminated or revoked sooner.  Performed at [facility], [address] [place], [place] [address]   MRSA Next Gen by PCR, Nasal     Status: None   Collection Time: 07/08/21 10:38 PM   Specimen: Nasal Mucosa; Nasal Swab  Result Value Ref  Range Status   MRSA by PCR Next Gen NOT DETECTED NOT DETECTED Final    Comment: (NOTE) The GeneXpert MRSA Assay (FDA approved for NASAL specimens only), is one component of a comprehensive MRSA colonization surveillance program. It is not intended to diagnose MRSA infection nor to guide or monitor treatment  for MRSA infections. Test performance is not FDA approved in patients less than 65 years old. Performed at [facility], [address] [place], [place] [address]      Time coordinating discharge: Over 30 minutes  SIGNED:   Wyvonnia Dusky, MD  [facility] 07/10/2021, 3:04 PM Pager   If 7PM-7AM, please contact night-coverage [email or web address]

## 2021-07-14 LAB — PTH-RELATED PEPTIDE: PTH-related peptide: <2 pmol/L

## 2021-07-26 ENCOUNTER — Emergency Department: Payer: Medicare Other

## 2021-07-26 ENCOUNTER — Other Ambulatory Visit: Payer: Self-pay

## 2021-07-26 ENCOUNTER — Telehealth: Payer: Self-pay

## 2021-07-26 ENCOUNTER — Inpatient Hospital Stay
Admission: EM | Admit: 2021-07-26 | Discharge: 2021-07-30 | DRG: 189 | Disposition: A | Discharge status: Home / Self Care | Payer: Medicare Other | Attending: Internal Medicine | Admitting: Internal Medicine

## 2021-07-26 DIAGNOSIS — Z8249 Family history of ischemic heart disease and other diseases of the circulatory system: Secondary | ICD-10-CM

## 2021-07-26 DIAGNOSIS — J189 Pneumonia, unspecified organism: Secondary | ICD-10-CM

## 2021-07-26 DIAGNOSIS — J9601 Acute respiratory failure with hypoxia: Principal | ICD-10-CM | POA: Diagnosis present

## 2021-07-26 DIAGNOSIS — I2510 Atherosclerotic heart disease of native coronary artery without angina pectoris: Secondary | ICD-10-CM | POA: Diagnosis present

## 2021-07-26 DIAGNOSIS — R778 Other specified abnormalities of plasma proteins: Secondary | ICD-10-CM | POA: Diagnosis not present

## 2021-07-26 DIAGNOSIS — G4734 Idiopathic sleep related nonobstructive alveolar hypoventilation: Secondary | ICD-10-CM

## 2021-07-26 DIAGNOSIS — I160 Hypertensive urgency: Secondary | ICD-10-CM | POA: Diagnosis not present

## 2021-07-26 DIAGNOSIS — Z20822 Contact with and (suspected) exposure to covid-19: Secondary | ICD-10-CM | POA: Diagnosis present

## 2021-07-26 DIAGNOSIS — Z7901 Long term (current) use of anticoagulants: Secondary | ICD-10-CM

## 2021-07-26 DIAGNOSIS — E785 Hyperlipidemia, unspecified: Secondary | ICD-10-CM | POA: Diagnosis present

## 2021-07-26 DIAGNOSIS — I5033 Acute on chronic diastolic (congestive) heart failure: Secondary | ICD-10-CM

## 2021-07-26 DIAGNOSIS — N186 End stage renal disease: Secondary | ICD-10-CM | POA: Diagnosis present

## 2021-07-26 DIAGNOSIS — I161 Hypertensive emergency: Secondary | ICD-10-CM

## 2021-07-26 DIAGNOSIS — Z888 Allergy status to other drugs, medicaments and biological substances status: Secondary | ICD-10-CM

## 2021-07-26 DIAGNOSIS — F419 Anxiety disorder, unspecified: Secondary | ICD-10-CM | POA: Diagnosis present

## 2021-07-26 DIAGNOSIS — E8770 Fluid overload, unspecified: Secondary | ICD-10-CM | POA: Diagnosis present

## 2021-07-26 DIAGNOSIS — I428 Other cardiomyopathies: Secondary | ICD-10-CM

## 2021-07-26 DIAGNOSIS — D631 Anemia in chronic kidney disease: Secondary | ICD-10-CM | POA: Diagnosis present

## 2021-07-26 DIAGNOSIS — I132 Hypertensive heart and chronic kidney disease with heart failure and with stage 5 chronic kidney disease, or end stage renal disease: Secondary | ICD-10-CM | POA: Diagnosis present

## 2021-07-26 DIAGNOSIS — Z9013 Acquired absence of bilateral breasts and nipples: Secondary | ICD-10-CM | POA: Diagnosis not present

## 2021-07-26 DIAGNOSIS — R569 Unspecified convulsions: Secondary | ICD-10-CM

## 2021-07-26 DIAGNOSIS — N2581 Secondary hyperparathyroidism of renal origin: Secondary | ICD-10-CM | POA: Diagnosis present

## 2021-07-26 DIAGNOSIS — M109 Gout, unspecified: Secondary | ICD-10-CM | POA: Diagnosis present

## 2021-07-26 DIAGNOSIS — Z992 Dependence on renal dialysis: Secondary | ICD-10-CM | POA: Diagnosis not present

## 2021-07-26 DIAGNOSIS — I5023 Acute on chronic systolic (congestive) heart failure: Secondary | ICD-10-CM

## 2021-07-26 DIAGNOSIS — Z803 Family history of malignant neoplasm of breast: Secondary | ICD-10-CM

## 2021-07-26 DIAGNOSIS — Z91048 Other nonmedicinal substance allergy status: Secondary | ICD-10-CM

## 2021-07-26 DIAGNOSIS — Z86718 Personal history of other venous thrombosis and embolism: Secondary | ICD-10-CM

## 2021-07-26 DIAGNOSIS — D320 Benign neoplasm of cerebral meninges: Secondary | ICD-10-CM | POA: Diagnosis present

## 2021-07-26 DIAGNOSIS — I5043 Acute on chronic combined systolic (congestive) and diastolic (congestive) heart failure: Secondary | ICD-10-CM

## 2021-07-26 DIAGNOSIS — K219 Gastro-esophageal reflux disease without esophagitis: Secondary | ICD-10-CM

## 2021-07-26 DIAGNOSIS — F32A Depression, unspecified: Secondary | ICD-10-CM | POA: Diagnosis present

## 2021-07-26 DIAGNOSIS — R002 Palpitations: Secondary | ICD-10-CM | POA: Diagnosis present

## 2021-07-26 DIAGNOSIS — D573 Sickle-cell trait: Secondary | ICD-10-CM | POA: Diagnosis present

## 2021-07-26 DIAGNOSIS — Z853 Personal history of malignant neoplasm of breast: Secondary | ICD-10-CM | POA: Diagnosis not present

## 2021-07-26 DIAGNOSIS — Z79899 Other long term (current) drug therapy: Secondary | ICD-10-CM

## 2021-07-26 DIAGNOSIS — I480 Paroxysmal atrial fibrillation: Secondary | ICD-10-CM | POA: Diagnosis present

## 2021-07-26 DIAGNOSIS — R7989 Other specified abnormal findings of blood chemistry: Secondary | ICD-10-CM

## 2021-07-26 DIAGNOSIS — I5021 Acute systolic (congestive) heart failure: Secondary | ICD-10-CM | POA: Diagnosis not present

## 2021-07-26 LAB — PROCALCITONIN: Procalcitonin: <0.1 ng/mL

## 2021-07-26 LAB — BRAIN NATRIURETIC PEPTIDE: B Natriuretic Peptide: 1390.4 pg/mL — ABNORMAL HIGH (ref 0.0–100.0)

## 2021-07-26 LAB — CBC WITH DIFFERENTIAL/PLATELET
Abs Immature Granulocytes: 0.02 10*3/uL (ref 0.00–0.07)
Basophils Absolute: 0 10*3/uL (ref 0.0–0.1)
Basophils Relative: 0 %
Eosinophils Absolute: 0.2 10*3/uL (ref 0.0–0.5)
Eosinophils Relative: 3 %
HCT: 29.9 % — ABNORMAL LOW (ref 36.0–46.0)
Hemoglobin: 9.6 g/dL — ABNORMAL LOW (ref 12.0–15.0)
Immature Granulocytes: 0 %
Lymphocytes Relative: 14 %
Lymphs Abs: 1 10*3/uL (ref 0.7–4.0)
MCH: 27.8 pg (ref 26.0–34.0)
MCHC: 32.1 g/dL (ref 30.0–36.0)
MCV: 86.7 fL (ref 80.0–100.0)
Monocytes Absolute: 0.6 10*3/uL (ref 0.1–1.0)
Monocytes Relative: 8 %
Neutro Abs: 5.2 10*3/uL (ref 1.7–7.7)
Neutrophils Relative %: 75 %
Platelets: 126 10*3/uL — ABNORMAL LOW (ref 150–400)
RBC: 3.45 MIL/uL — ABNORMAL LOW (ref 3.87–5.11)
RDW: 17.6 % — ABNORMAL HIGH (ref 11.5–15.5)
WBC: 6.9 10*3/uL (ref 4.0–10.5)
nRBC: 0 % (ref 0.0–0.2)

## 2021-07-26 LAB — PROTIME-INR
INR: 2.6 — ABNORMAL HIGH (ref 0.8–1.2)
Prothrombin Time: 27.6 seconds — ABNORMAL HIGH (ref 11.4–15.2)

## 2021-07-26 LAB — BASIC METABOLIC PANEL
Anion gap: 9 (ref 5–15)
BUN: 36 mg/dL — ABNORMAL HIGH (ref 8–23)
CO2: 27 mmol/L (ref 22–32)
Calcium: 10.5 mg/dL — ABNORMAL HIGH (ref 8.9–10.3)
Chloride: 106 mmol/L (ref 98–111)
Creatinine, Ser: 8.32 mg/dL — ABNORMAL HIGH (ref 0.44–1.00)
GFR, Estimated: 5 mL/min — ABNORMAL LOW (ref >60)
Glucose, Bld: 102 mg/dL — ABNORMAL HIGH (ref 70–99)
Potassium: 3.6 mmol/L (ref 3.5–5.1)
Sodium: 142 mmol/L (ref 135–145)

## 2021-07-26 LAB — SARS CORONAVIRUS 2 BY RT PCR: SARS Coronavirus 2 by RT PCR: NEGATIVE

## 2021-07-26 LAB — TROPONIN I (HIGH SENSITIVITY): Troponin I (High Sensitivity): 28 ng/L — ABNORMAL HIGH (ref <18)

## 2021-07-26 MED ORDER — NITROGLYCERIN 0.4 MG SL SUBL
0.4000 mg | SUBLINGUAL_TABLET | SUBLINGUAL | Status: DC | PRN
Start: 2021-07-26 — End: 2021-07-30
  Administered 2021-07-26 – 2021-07-27 (×2): 0.4 mg via SUBLINGUAL
  Filled 2021-07-26: qty 1

## 2021-07-26 MED ORDER — SACUBITRIL-VALSARTAN 24-26 MG PO TABS
1.0000 | ORAL_TABLET | Freq: Two times a day (BID) | ORAL | Status: DC
  Administered 2021-07-26 – 2021-07-30 (×8): 1 via ORAL
  Filled 2021-07-26 (×9): qty 1

## 2021-07-26 MED ORDER — OXYCODONE-ACETAMINOPHEN 5-325 MG PO TABS
1.0000 | ORAL_TABLET | Freq: Once | ORAL | Status: AC
  Administered 2021-07-26: 1 via ORAL
  Filled 2021-07-26: qty 1

## 2021-07-26 NOTE — ED Notes (Signed)
Patient trialed on room air. Spo2 maintaining >92%.   MD at bedside and made aware pt requesting medication for h/a. MD states orders to be put in.

## 2021-07-26 NOTE — Telephone Encounter (Signed)
Patient seen in the ER for a Meningioma. Please schedule a new patient appt with Danielle or Dr. Izora Ribas.

## 2021-07-26 NOTE — ED Notes (Signed)
Pt oxygen now 89-90% on RA. Pt placed back on 1 L [place].

## 2021-07-26 NOTE — ED Provider Notes (Signed)
[facility] Provider Note    Event Date/Time   First MD Initiated Contact with Patient 07/26/21 2153     (approximate)   History   Shortness of Breath   HPI  Teresa Cook is a 66 y.o. female with medical history significant of ESRD-HD (MWF), hypertension, gout, depression with anxiety, sickle cell trait, DVT on Coumadin, sCHF with EF 40-45%, bilateral breast cancer (s/p of bilateral mastectomy), seizure not on medications currently, meningioma, hyperparathyroidism and recent admission discharged on 7/4 for management of acute volume overload who presents for evaluation of 1 day of acute on chronic shortness of breath and some mild cough.  She has states she had a little chest tightness.  She endorses ongoing chronic headache that is 90 for today and usual.  No fevers, vomiting, diarrhea, abdominal pain, back pain rash or extremity pain.  She denies any tobacco use or EtOH use or illicit drug use.  Reportedly hypoxic with EMS at 88% requiring 3 L nasal cannula and transport.  She reports she took all of her morning blood pressure medicines.  She has not missed any dialysis sessions.      Physical Exam  Triage Vital Signs: ED Triage Vitals  Enc Vitals Group     BP      Pulse      Resp      Temp      Temp src      SpO2      Weight      Height      Head Circumference      Peak Flow      Pain Score      Pain Loc      Pain Edu?      Excl. in [place]?     Most recent vital signs: Vitals:   07/26/21 2320 07/26/21 2325  BP: (!) 179/115 (!) 182/118  Pulse: 79 81  Resp: (!) 26 20  Temp:    SpO2: 94% 95%    General: Awake, uncomfortable appearing. CV:  Good peripheral perfusion.  2+ radial pulse. Resp:  Normal effort.  Some rales at the bases.  Tachypneic.  No wheezing. Abd:  No distention.  Soft. Other:  Minimal symmetric lower extremity edema   ED Results / Procedures / Treatments  Labs (all labs ordered are listed, but only abnormal results  are displayed) Labs Reviewed  CBC WITH DIFFERENTIAL/PLATELET - Abnormal; Notable for the following components:      Result Value   RBC 3.45 (*)    Hemoglobin 9.6 (*)    HCT 29.9 (*)    RDW 17.6 (*)    Platelets 126 (*)    All other components within normal limits  BASIC METABOLIC PANEL - Abnormal; Notable for the following components:   Glucose, Bld 102 (*)    BUN 36 (*)    Creatinine, Ser 8.32 (*)    Calcium 10.5 (*)    GFR, Estimated 5 (*)    All other components within normal limits  BRAIN NATRIURETIC PEPTIDE - Abnormal; Notable for the following components:   B Natriuretic Peptide 1,390.4 (*)    All other components within normal limits  PROTIME-INR - Abnormal; Notable for the following components:   Prothrombin Time 27.6 (*)    INR 2.6 (*)    All other components within normal limits  TROPONIN I (HIGH SENSITIVITY) - Abnormal; Notable for the following components:   Troponin I (High Sensitivity) 28 (*)    All other  components within normal limits  SARS CORONAVIRUS 2 BY RT PCR  PROCALCITONIN  TROPONIN I (HIGH SENSITIVITY)     EKG  EKG is marked for sinus rhythm with ventricular rate of 91, some nonspecific ST change there is artifact in V4 and V5 without other clearance of acute ischemia or significant arrhythmia.   RADIOLOGY  Chest x-ray my interpretation shows cardiomegaly and bilateral pulm edema.  No pneumothorax, focal consolidation or large effusion.  I also reviewed radiology's interpretation.  PROCEDURES:  Critical Care performed: Yes, see critical care procedure note(s)  .1-3 Lead EKG Interpretation  Performed by: Lucrezia Starch, MD Authorized by: Lucrezia Starch, MD     Interpretation: normal     ECG rate assessment: normal     Rhythm: sinus rhythm     Ectopy: none     Conduction: normal   .Critical Care  Performed by: Lucrezia Starch, MD Authorized by: Lucrezia Starch, MD   Critical care provider statement:    Critical care time  (minutes):  30   Critical care was necessary to treat or prevent imminent or life-threatening deterioration of the following conditions:  Respiratory failure   Critical care was time spent personally by me on the following activities:  Development of treatment plan with patient or surrogate, discussions with consultants, evaluation of patient's response to treatment, examination of patient, ordering and review of laboratory studies, ordering and review of radiographic studies, ordering and performing treatments and interventions, pulse oximetry, re-evaluation of patient's condition and review of old charts     MEDICATIONS ORDERED IN ED: Medications  sacubitril-valsartan (ENTRESTO) 24-26 mg per tablet (1 tablet Oral Given 07/26/21 2232)  nitroGLYCERIN (NITROSTAT) SL tablet 0.4 mg (0.4 mg Sublingual Given 07/26/21 2329)  oxyCODONE-acetaminophen (PERCOCET/ROXICET) 5-325 MG per tablet 1 tablet (has no administration in time range)     IMPRESSION / MDM / ASSESSMENT AND PLAN / ED COURSE  I reviewed the triage vital signs and the nursing notes. Patient's presentation is most consistent with acute presentation with potential threat to life or bodily function.                               Differential diagnosis includes, but is not limited to ACS, pneumonia, volume overload, pneumothorax, bronchitis.  Lower suspicion for PE as her INR is therapeutic at 2.6.  EKG is marked for sinus rhythm with ventricular rate of 91, some nonspecific ST change there is artifact in V4 and V5 without other clearance of acute ischemia or significant arrhythmia.  Chest x-ray my interpretation shows cardiomegaly and bilateral pulm edema.  No pneumothorax, focal consolidation or large effusion.  I also reviewed radiology's interpretation.  BNP is elevated at 1390.  CBC shows no ketosis hemoglobin 9.6 compared to 9.62 weeks ago this is not suggestive of acute symptomatic anemia.  BMP shows kidney function consistent with  history of ESRD without any other significant acute derangements.  COVID PCR is negative.  Troponin slight elevated 26 compared to 62 weeks ago.  I have a low suspicion for occlusion MImild demand ischemia in the setting of high blood pressure, volume overload and likely some chronic troponinemia from her CKD.  I did attempt to wean patient to room air but she is still tachypneic and satting 89%.  Overall clinical picture is most suggestive of acute volume overload and I think she requires admission to be dialyzed first thing in the morning.  She is  agreeable to this.  Admit to medicine service for further evaluation and management.      FINAL CLINICAL IMPRESSION(S) / ED DIAGNOSES   Final diagnoses:  Acute respiratory failure with hypoxia (HCC)  Hypervolemia, unspecified hypervolemia type  Troponin I above reference range     Rx / DC Orders   ED Discharge Orders     None        Note:  This document was prepared using Dragon voice recognition software and may include unintentional dictation errors.   Lucrezia Starch, MD 07/26/21 [phone]

## 2021-07-26 NOTE — ED Triage Notes (Signed)
SOB. Hx of HTN and HTN on arrival. Dialysis pt and compliant with treatments. Last dialyzed on Wednesday. Pt alert and oriented. Denies chest pain at rest but reports cp on exertion. EMS found pt at 88% on RA and applied 3L [place] en route. O2 improved to 96%. Pt does not wear oxygen at baseline.

## 2021-07-26 NOTE — Telephone Encounter (Signed)
Schedule with Danielle

## 2021-07-27 ENCOUNTER — Inpatient Hospital Stay: Payer: Medicare Other

## 2021-07-27 DIAGNOSIS — I5023 Acute on chronic systolic (congestive) heart failure: Secondary | ICD-10-CM

## 2021-07-27 DIAGNOSIS — D631 Anemia in chronic kidney disease: Secondary | ICD-10-CM

## 2021-07-27 DIAGNOSIS — J189 Pneumonia, unspecified organism: Secondary | ICD-10-CM

## 2021-07-27 DIAGNOSIS — I161 Hypertensive emergency: Secondary | ICD-10-CM | POA: Diagnosis not present

## 2021-07-27 DIAGNOSIS — I480 Paroxysmal atrial fibrillation: Secondary | ICD-10-CM

## 2021-07-27 DIAGNOSIS — D320 Benign neoplasm of cerebral meninges: Secondary | ICD-10-CM

## 2021-07-27 DIAGNOSIS — R778 Other specified abnormalities of plasma proteins: Secondary | ICD-10-CM | POA: Diagnosis not present

## 2021-07-27 DIAGNOSIS — R569 Unspecified convulsions: Secondary | ICD-10-CM

## 2021-07-27 DIAGNOSIS — N186 End stage renal disease: Secondary | ICD-10-CM

## 2021-07-27 DIAGNOSIS — J9601 Acute respiratory failure with hypoxia: Secondary | ICD-10-CM

## 2021-07-27 DIAGNOSIS — I160 Hypertensive urgency: Secondary | ICD-10-CM | POA: Diagnosis not present

## 2021-07-27 DIAGNOSIS — Z992 Dependence on renal dialysis: Secondary | ICD-10-CM

## 2021-07-27 LAB — TROPONIN I (HIGH SENSITIVITY)
Troponin I (High Sensitivity): 30 ng/L — ABNORMAL HIGH (ref <18)
Troponin I (High Sensitivity): 37 ng/L — ABNORMAL HIGH (ref <18)
Troponin I (High Sensitivity): 52 ng/L — ABNORMAL HIGH (ref <18)

## 2021-07-27 LAB — CBC
HCT: 27.1 % — ABNORMAL LOW (ref 36.0–46.0)
Hemoglobin: 8.6 g/dL — ABNORMAL LOW (ref 12.0–15.0)
MCH: 27.6 pg (ref 26.0–34.0)
MCHC: 31.7 g/dL (ref 30.0–36.0)
MCV: 86.9 fL (ref 80.0–100.0)
Platelets: 161 10*3/uL (ref 150–400)
RBC: 3.12 MIL/uL — ABNORMAL LOW (ref 3.87–5.11)
RDW: 17.5 % — ABNORMAL HIGH (ref 11.5–15.5)
WBC: 6.5 10*3/uL (ref 4.0–10.5)
nRBC: 0 % (ref 0.0–0.2)

## 2021-07-27 LAB — BLOOD GAS, ARTERIAL
Acid-base deficit: 2.9 mmol/L — ABNORMAL HIGH (ref 0.0–2.0)
Bicarbonate: 22.6 mmol/L (ref 20.0–28.0)
Delivery systems: POSITIVE
Expiratory PAP: 8 cmH2O
FIO2: 100 %
Inspiratory PAP: 16 cmH2O
O2 Saturation: 99.9 %
Patient temperature: 37
pCO2 arterial: 41 mmHg (ref 32–48)
pH, Arterial: 7.35 (ref 7.35–7.45)
pO2, Arterial: 274 mmHg — ABNORMAL HIGH (ref 83–108)

## 2021-07-27 LAB — RESPIRATORY PANEL BY PCR

## 2021-07-27 LAB — PROTIME-INR
INR: 2.8 — ABNORMAL HIGH (ref 0.8–1.2)
Prothrombin Time: 29.6 seconds — ABNORMAL HIGH (ref 11.4–15.2)

## 2021-07-27 LAB — HEPATITIS B SURFACE ANTIGEN: Hepatitis B Surface Ag: NONREACTIVE

## 2021-07-27 LAB — HEPATITIS B SURFACE ANTIBODY,QUALITATIVE: Hep B S Ab: NONREACTIVE

## 2021-07-27 LAB — HEPATITIS B CORE ANTIBODY, TOTAL: Hep B Core Total Ab: NONREACTIVE

## 2021-07-27 LAB — HEPATITIS C ANTIBODY: HCV Ab: NONREACTIVE

## 2021-07-27 LAB — GLUCOSE, CAPILLARY: Glucose-Capillary: 182 mg/dL — ABNORMAL HIGH (ref 70–99)

## 2021-07-27 MED ORDER — METOPROLOL SUCCINATE ER 50 MG PO TB24
50.0000 mg | ORAL_TABLET | Freq: Every day | ORAL | Status: DC
  Administered 2021-07-28 – 2021-07-29 (×2): 50 mg via ORAL
  Filled 2021-07-27 (×2): qty 1

## 2021-07-27 MED ORDER — PANTOPRAZOLE SODIUM 40 MG PO TBEC
40.0000 mg | DELAYED_RELEASE_TABLET | Freq: Every day | ORAL | Status: DC
  Administered 2021-07-27 – 2021-07-29 (×3): 40 mg via ORAL
  Filled 2021-07-27 (×3): qty 1

## 2021-07-27 MED ORDER — LIDOCAINE HCL (PF) 1 % IJ SOLN
5.0000 mL | INTRAMUSCULAR | Status: DC | PRN
Start: 2021-07-27 — End: 2021-07-28

## 2021-07-27 MED ORDER — MELATONIN 5 MG PO TABS
5.0000 mg | ORAL_TABLET | Freq: Every day | ORAL | Status: DC
  Administered 2021-07-27: 5 mg via ORAL
  Filled 2021-07-27: qty 1

## 2021-07-27 MED ORDER — ALBUTEROL SULFATE (2.5 MG/3ML) 0.083% IN NEBU
3.0000 mL | INHALATION_SOLUTION | RESPIRATORY_TRACT | Status: DC | PRN
Start: 2021-07-27 — End: 2021-07-30

## 2021-07-27 MED ORDER — ONDANSETRON HCL 4 MG/2ML IJ SOLN: 4.0000 mg | Freq: Four times a day (QID) | INTRAMUSCULAR | Status: DC | PRN

## 2021-07-27 MED ORDER — ISOSORBIDE MONONITRATE ER 30 MG PO TB24
30.0000 mg | ORAL_TABLET | Freq: Every day | ORAL | Status: DC
Start: 2021-07-27 — End: 2021-07-30
  Administered 2021-07-27 – 2021-07-29 (×2): 30 mg via ORAL
  Filled 2021-07-27 (×4): qty 1

## 2021-07-27 MED ORDER — LABETALOL HCL 5 MG/ML IV SOLN
10.0000 mg | Freq: Once | INTRAVENOUS | Status: AC
  Administered 2021-07-27: 10 mg via INTRAVENOUS

## 2021-07-27 MED ORDER — ZOLPIDEM TARTRATE 5 MG PO TABS: 5.0000 mg | ORAL_TABLET | Freq: Every evening | ORAL | Status: DC | PRN

## 2021-07-27 MED ORDER — LABETALOL HCL 5 MG/ML IV SOLN
INTRAVENOUS | Status: AC
  Filled 2021-07-27: qty 4

## 2021-07-27 MED ORDER — SEVELAMER CARBONATE 800 MG PO TABS: 800.0000 mg | ORAL_TABLET | Freq: Two times a day (BID) | ORAL | Status: DC | PRN

## 2021-07-27 MED ORDER — HEPARIN SODIUM (PORCINE) 1000 UNIT/ML DIALYSIS
1000.0000 [IU] | INTRAMUSCULAR | Status: DC | PRN
  Filled 2021-07-27: qty 1

## 2021-07-27 MED ORDER — SERTRALINE HCL 50 MG PO TABS
150.0000 mg | ORAL_TABLET | Freq: Every day | ORAL | Status: DC
  Administered 2021-07-27 – 2021-07-30 (×4): 150 mg via ORAL
  Filled 2021-07-27 (×5): qty 3

## 2021-07-27 MED ORDER — CHLORHEXIDINE GLUCONATE CLOTH 2 % EX PADS
6.0000 | MEDICATED_PAD | Freq: Every day | CUTANEOUS | Status: DC
  Administered 2021-07-27 – 2021-07-30 (×4): 6 via TOPICAL
  Filled 2021-07-27: qty 6

## 2021-07-27 MED ORDER — ZOLPIDEM TARTRATE 5 MG PO TABS
5.0000 mg | ORAL_TABLET | Freq: Every day | ORAL | Status: DC
  Administered 2021-07-27: 5 mg via ORAL
  Filled 2021-07-27: qty 1

## 2021-07-27 MED ORDER — LORAZEPAM 2 MG/ML IJ SOLN
0.5000 mg | Freq: Once | INTRAMUSCULAR | Status: AC
  Administered 2021-07-27: 0.5 mg via INTRAVENOUS
  Filled 2021-07-27: qty 1

## 2021-07-27 MED ORDER — ALTEPLASE 2 MG IJ SOLR
2.0000 mg | Freq: Once | INTRAMUSCULAR | Status: DC | PRN
  Filled 2021-07-27: qty 2

## 2021-07-27 MED ORDER — HYDRALAZINE HCL 20 MG/ML IJ SOLN
10.0000 mg | Freq: Four times a day (QID) | INTRAMUSCULAR | Status: DC | PRN
  Administered 2021-07-27: 10 mg via INTRAVENOUS
  Filled 2021-07-27: qty 1

## 2021-07-27 MED ORDER — ACETAMINOPHEN-CAFFEINE 500-65 MG PO TABS
1.0000 | ORAL_TABLET | Freq: Four times a day (QID) | ORAL | Status: DC | PRN
  Administered 2021-07-27: 2 via ORAL
  Administered 2021-07-28: 1 via ORAL
  Administered 2021-07-28: 2 via ORAL
  Administered 2021-07-29: 1 via ORAL
  Filled 2021-07-27 (×6): qty 2

## 2021-07-27 MED ORDER — AZITHROMYCIN 500 MG PO TABS
500.0000 mg | ORAL_TABLET | Freq: Every day | ORAL | Status: DC
  Administered 2021-07-27: 500 mg via ORAL
  Filled 2021-07-27 (×2): qty 1

## 2021-07-27 MED ORDER — HEPARIN SODIUM (PORCINE) 1000 UNIT/ML IJ SOLN
INTRAMUSCULAR | Status: AC
  Filled 2021-07-27: qty 10

## 2021-07-27 MED ORDER — SEVELAMER CARBONATE 800 MG PO TABS
2400.0000 mg | ORAL_TABLET | Freq: Three times a day (TID) | ORAL | Status: DC
Start: 2021-07-27 — End: 2021-07-30
  Administered 2021-07-27 – 2021-07-30 (×7): 2400 mg via ORAL
  Filled 2021-07-27 (×9): qty 3

## 2021-07-27 MED ORDER — HYDRALAZINE HCL 20 MG/ML IJ SOLN: 5.0000 mg | Freq: Four times a day (QID) | INTRAMUSCULAR | Status: DC | PRN

## 2021-07-27 MED ORDER — ASPIRIN-ACETAMINOPHEN-CAFFEINE 250-250-65 MG PO TABS: 1.0000 | ORAL_TABLET | Freq: Four times a day (QID) | ORAL | Status: DC | PRN

## 2021-07-27 MED ORDER — ACETAMINOPHEN 325 MG PO TABS: 650.0000 mg | ORAL_TABLET | Freq: Four times a day (QID) | ORAL | Status: DC | PRN

## 2021-07-27 MED ORDER — LIDOCAINE-PRILOCAINE 2.5-2.5 % EX CREA: 1.0000 | TOPICAL_CREAM | CUTANEOUS | Status: DC | PRN

## 2021-07-27 MED ORDER — CINACALCET HCL 30 MG PO TABS
60.0000 mg | ORAL_TABLET | Freq: Every day | ORAL | Status: DC
Start: 2021-07-27 — End: 2021-07-30
  Administered 2021-07-28 – 2021-07-30 (×3): 60 mg via ORAL
  Filled 2021-07-27 (×4): qty 2

## 2021-07-27 MED ORDER — ACETAMINOPHEN 650 MG RE SUPP: 650.0000 mg | Freq: Four times a day (QID) | RECTAL | Status: DC | PRN

## 2021-07-27 MED ORDER — TRAZODONE HCL 50 MG PO TABS
50.0000 mg | ORAL_TABLET | Freq: Every day | ORAL | Status: DC
  Administered 2021-07-27 – 2021-07-29 (×4): 50 mg via ORAL
  Filled 2021-07-27 (×4): qty 1

## 2021-07-27 MED ORDER — METHYLPREDNISOLONE SODIUM SUCC 40 MG IJ SOLR
40.0000 mg | Freq: Every day | INTRAMUSCULAR | Status: DC
  Administered 2021-07-27: 40 mg via INTRAVENOUS
  Filled 2021-07-27: qty 1

## 2021-07-27 MED ORDER — WARFARIN SODIUM 4 MG PO TABS
4.0000 mg | ORAL_TABLET | Freq: Once | ORAL | Status: AC
Start: 2021-07-27 — End: 2021-07-27
  Administered 2021-07-27: 4 mg via ORAL
  Filled 2021-07-27: qty 1

## 2021-07-27 MED ORDER — ALPRAZOLAM 0.25 MG PO TABS
0.2500 mg | ORAL_TABLET | Freq: Three times a day (TID) | ORAL | Status: DC | PRN
  Administered 2021-07-27 – 2021-07-30 (×5): 0.25 mg via ORAL
  Filled 2021-07-27 (×5): qty 1

## 2021-07-27 MED ORDER — NITROGLYCERIN 2 % TD OINT
0.5000 [in_us] | TOPICAL_OINTMENT | Freq: Four times a day (QID) | TRANSDERMAL | Status: DC
  Administered 2021-07-27 (×3): 0.5 [in_us] via TOPICAL
  Filled 2021-07-27 (×3): qty 1

## 2021-07-27 MED ORDER — GUAIFENESIN-DM 100-10 MG/5ML PO SYRP
5.0000 mL | ORAL_SOLUTION | ORAL | Status: DC | PRN
Start: 2021-07-27 — End: 2021-07-30

## 2021-07-27 MED ORDER — WARFARIN SODIUM 5 MG PO TABS: 5.0000 mg | ORAL_TABLET | ORAL | Status: DC

## 2021-07-27 MED ORDER — CALCIUM CARBONATE ANTACID 500 MG PO CHEW: 2.0000 | CHEWABLE_TABLET | Freq: Once | ORAL | Status: DC

## 2021-07-27 MED ORDER — ANTICOAGULANT SODIUM CITRATE 4% (200MG/5ML) IV SOLN
5.0000 mL | Status: DC | PRN
  Filled 2021-07-27: qty 5

## 2021-07-27 MED ORDER — ONDANSETRON HCL 4 MG PO TABS: 4.0000 mg | ORAL_TABLET | Freq: Four times a day (QID) | ORAL | Status: DC | PRN

## 2021-07-27 MED ORDER — NITROGLYCERIN 2 % TD OINT: 1.0000 [in_us] | TOPICAL_OINTMENT | Freq: Four times a day (QID) | TRANSDERMAL | Status: DC

## 2021-07-27 MED ORDER — NITROGLYCERIN IN D5W 200-5 MCG/ML-% IV SOLN: 0.0000 ug/min | INTRAVENOUS | Status: DC

## 2021-07-27 MED ORDER — WARFARIN - PHARMACIST DOSING INPATIENT
Freq: Every day | Status: DC
  Filled 2021-07-27: qty 1

## 2021-07-27 MED ORDER — PENTAFLUOROPROP-TETRAFLUOROETH EX AERO
1.0000 | INHALATION_SPRAY | CUTANEOUS | Status: DC | PRN
  Filled 2021-07-27: qty 30

## 2021-07-27 NOTE — Progress Notes (Signed)
Post HD RN assessment 

## 2021-07-27 NOTE — Progress Notes (Signed)
   07/27/21 1200  Clinical Encounter Type  Visited With Patient  Visit Type Initial  Referral From Nurse  Consult/Referral To Chaplain   Chaplain responded to rapid in HD unit. Chaplain was told family was in ED. Chaplain went to ED to locate family but was not able to. Chaplain will follow up as patient is being transferred to inpatient.

## 2021-07-27 NOTE — Progress Notes (Addendum)
[facility]  ROUNDING NOTE   Subjective:   Teresa Cook 66 year old African-American female with past medical conditions including hypertension, gout, anxiety with depression, sickle cell trait, systolic heart failure with a EF 40 to 45%, breast cancer with bilateral mastectomy, seizures, and end-stage renal disease on hemodialysis.  Patient presents to the emergency department with complaints of shortness of breath.  Patient has been admitted for Acute respiratory failure with hypoxia ([place]) [J96.01] Troponin I above reference range [R77.8] Fluid overload [E87.70] Hypervolemia, unspecified hypervolemia type [E87.70]  Patient is known to our practice and receives outpatient dialysis treatments at [facility] on a MWF schedule, supervised by Dr. Candiss Norse.  Patient presents to the emergency department with complaints of shortness of breath that began yesterday evening.  Patient states she has not been straight from renal diet or consumed excess fluids.  Patient states she was awoken early this morning with symptoms.  Patient has not missed any recent dialysis treatments and has underwent challenge treatments in outpatient setting to receive additional fluid removal.  Currently denies nausea, vomiting, or diarrhea.  Denies cough.  States she had some abdominal pain that would radiate to her right back.  Patient seen and evaluated during dialysis   HEMODIALYSIS FLOWSHEET:  Blood Flow Rate (mL/min): 300 mL/min Arterial Pressure (mmHg): -130 mmHg Venous Pressure (mmHg): 320 mmHg TMP (mmHg): 13 mmHg Ultrafiltration Rate (mL/min): 122 mL/min Dialysate Flow Rate (mL/min): 300 ml/min Dialysis Fluid Bolus: Normal Saline Bolus Amount (mL): 300 mL  Patient states she feels uncomfortable, mild abdominal discomfort.  Labs on ED arrival unremarkable for renal patient.  Troponin 28.  Chest x-ray chills mildly progressive atypical infection over interstitial edema.  Objective:   Vital signs in last 24 hours:  Temp:  [97.8 F (36.6 C)-98.8 F (37.1 C)] 97.8 F (36.6 C) (07/21 1125) Pulse Rate:  [48-112] 112 (07/21 1400) Resp:  [14-40] 18 (07/21 1330) BP: (137-223)/(92-156) 159/111 (07/21 1400) SpO2:  [83 %-100 %] 100 % (07/21 1400) Weight:  [54.8 kg-57.2 kg] 54.8 kg (07/21 1206)  Weight change:  Filed Weights   07/26/21 2201 07/27/21 1206  Weight: 57.2 kg 54.8 kg    Intake/Output: No intake/output data recorded.   Intake/Output this shift:  No intake/output data recorded.  Physical Exam: General: Ill-appearing, anxious  Head: Normocephalic, atraumatic. Moist oral mucosal membranes  Eyes: Anicteric  Lungs:  Right-sided wheeze, tachypneic, 3 L [place]  Heart: Regular rate and rhythm  Abdomen:  Firm, tender, distended  Extremities: No peripheral edema.  Neurologic: Nonfocal, moving all four extremities  Skin: No lesions  Access: Left PermCath    Basic Metabolic Panel: Recent Labs  Lab 07/26/21 2218  NA 142  K 3.6  CL 106  CO2 27  GLUCOSE 102*  BUN 36*  CREATININE 8.32*  CALCIUM 10.5*    Liver Function Tests: No results for input(s): "AST", "ALT", "ALKPHOS", "BILITOT", "PROT", "ALBUMIN" in the last 168 hours. No results for input(s): "LIPASE", "AMYLASE" in the last 168 hours. No results for input(s): "AMMONIA" in the last 168 hours.  CBC: Recent Labs  Lab 07/26/21 2218 07/27/21 0454  WBC 6.9 6.5  NEUTROABS 5.2  --   HGB 9.6* 8.6*  HCT 29.9* 27.1*  MCV 86.7 86.9  PLT 126* 161    Cardiac Enzymes: No results for input(s): "CKTOTAL", "CKMB", "CKMBINDEX", "TROPONINI" in the last 168 hours.  BNP: Invalid input(s): "POCBNP"  CBG: Recent Labs  Lab 07/27/21 [place]*    Microbiology: Results for orders placed  or performed during the hospital encounter of 07/26/21  SARS Coronavirus 2 by RT PCR (hospital order, performed in [facility] hospital lab) *cepheid single result test* Anterior Nasal Swab     Status: None    Collection Time: 07/26/21 10:36 PM   Specimen: Anterior Nasal Swab  Result Value Ref Range Status   SARS Coronavirus 2 by RT PCR NEGATIVE NEGATIVE Final    Comment: (NOTE) SARS-CoV-2 target nucleic acids are NOT DETECTED.  The SARS-CoV-2 RNA is generally detectable in upper and lower respiratory specimens during the acute phase of infection. The lowest concentration of SARS-CoV-2 viral copies this assay can detect is 250 copies / mL. A negative result does not preclude SARS-CoV-2 infection and should not be used as the sole basis for treatment or other patient management decisions.  A negative result may occur with improper specimen collection / handling, submission of specimen other than nasopharyngeal swab, presence of viral mutation(s) within the areas targeted by this assay, and inadequate number of viral copies (<250 copies / mL). A negative result must be combined with clinical observations, patient history, and epidemiological information.  Fact Sheet for Patients:   https://www.patel.info/  Fact Sheet for Healthcare Providers: https://hall.com/  This test is not yet approved or  cleared by the [place] FDA and has been authorized for detection and/or diagnosis of SARS-CoV-2 by FDA under an Emergency Use Authorization (EUA).  This EUA will remain in effect (meaning this test can be used) for the duration of the COVID-19 declaration under Section 564(b)(1) of the Act, 21 U.S.C. section 360bbb-3(b)(1), unless the authorization is terminated or revoked sooner.  Performed at [facility], [address] [place], [place] [address]     Coagulation Studies: Recent Labs    07/26/21 April 15, 2216 07/27/21 0454  LABPROT 27.6* 29.6*  INR 2.6* 2.8*    Urinalysis: No results for input(s): "COLORURINE", "LABSPEC", "PHURINE", "GLUCOSEU", "HGBUR", "BILIRUBINUR", "KETONESUR", "PROTEINUR", "UROBILINOGEN", "NITRITE", "LEUKOCYTESUR"  in the last 72 hours.  Invalid input(s): "APPERANCEUR"    Imaging: DG Chest Port 1 View  Result Date: 07/27/2021 CLINICAL DATA:  Chest pain and shortness of breath EXAM: PORTABLE CHEST 1 VIEW COMPARISON:  Chest radiograph 1 day prior FINDINGS: The left-sided vascular catheter is stable terminating in the right atrium. The cardiomediastinal silhouette is stable. Extensive opacities are seen throughout the right lung which have significantly worsened since the study from 1 day prior. Aeration of the left lung is not significantly changed. There is no significant pleural effusion. There is no pneumothorax. The bones are stable. IMPRESSION: New extensive opacities throughout the right lung may reflect new asymmetric alveolar edema, infection, or combination thereof. Electronically Signed   By: Valetta Mole M.D.   On: 07/27/2021 12:06   DG Chest Port 1 View  Result Date: 07/26/2021 CLINICAL DATA:  Shortness of breath EXAM: PORTABLE CHEST 1 VIEW COMPARISON:  07/08/2021 FINDINGS: Increased interstitial markings, chronic, although mildly progressive, particularly in the right lower lobe. This appearance favors atypical infection over interstitial edema. No definite pleural effusions. No pneumothorax. The heart is top-normal in size.  Thoracic aortic atherosclerosis. Left IJ dual lumen dialysis catheter. IMPRESSION: Increased interstitial markings, mildly progressive, favoring atypical infection over interstitial edema. Electronically Signed   By: Julian Hy M.D.   On: 07/26/2021 22:17     Medications:    anticoagulant sodium citrate     nitroGLYCERIN      azithromycin  500 mg Oral Daily   calcium carbonate  2 tablet Oral Once  Chlorhexidine Gluconate Cloth  6 each Topical Q0600   cinacalcet  60 mg Oral QAC lunch   methylPREDNISolone (SOLU-MEDROL) injection  40 mg Intravenous Daily   metoprolol succinate  50 mg Oral Daily   pantoprazole  40 mg Oral Daily   sacubitril-valsartan  1 tablet  Oral BID   sertraline  150 mg Oral Daily   sevelamer carbonate  2,400 mg Oral TID with meals   traZODone  50 mg Oral QHS   warfarin  4 mg Oral ONCE-1600   Warfarin - Pharmacist Dosing Inpatient   Does not apply q1600   acetaminophen **OR** acetaminophen, albuterol, alteplase, anticoagulant sodium citrate, aspirin-acetaminophen-caffeine, guaiFENesin-dextromethorphan, heparin, hydrALAZINE, lidocaine (PF), lidocaine-prilocaine, nitroGLYCERIN, ondansetron **OR** ondansetron (ZOFRAN) IV, pentafluoroprop-tetrafluoroeth, sevelamer carbonate  Assessment/ Plan:  [name] is a 66 y.o.  female with past medical conditions including hypertension, gout, anxiety with depression, sickle cell trait, systolic heart failure with a EF 40 to 45%, breast cancer with bilateral mastectomy, seizures, and end-stage renal disease on hemodialysis.  Patient presents to the emergency department with complaints of shortness of breath.  Patient has been admitted for  [facility]/MWF/Lt Permcath  End-stage renal disease on hemodialysis.  Will maintain outpatient schedule if possible.  Patient scheduled to receive dialysis today, UF goal 1.5 to 2 L as tolerated.  Dialysis was terminated 37 minutes into treatment.  Patient began to complain of chest pain, tachypneic, tachycardic with decreased saturations.  Patient kept stating "I cannot breathe".  Rated chest pain a 9 out of 10.  Patient also complaining of right-sided abdominal pain that radiates to her back.  Rapid response called, stat chest x-ray ordered, and labetalol IV 10 mg for elevated blood pressure..  Patient stabilized and transferred to ICU.  Will provide bedside dialysis treatment later today.  Will provide sequential treatment tomorrow.   2.  Acute respiratory failure with hypoxia.  Chest x-ray ordered shows new extensive opacities throughout the right lung concerning for alveolar edema, infection or combination of both.  Patient placed on BiPAP  and transferred to ICU from dialysis suite.  3.  Acute on chronic systolic heart failure.  Echo completed on 01/23/2020 shows EF 40 to 45% with a moderate LVH.  Patient has had multiple emergency department visits for shortness of breath and pulmonary edema.  Patient states she is not missed any recent dialysis treatments and has received multiple challenges recently to remove excess fluid.  We will recommending consulting cardiology for follow-up with a repeat echo to determine cardiac source.  4. Anemia of chronic kidney disease Lab Results  Component Value Date   HGB 8.6 (L) 07/27/2021    Hemoglobin below desired target.  Patient receives [place] outpatient  5. Secondary Hyperparathyroidism: Lab Results  Component Value Date   PTH 214 (H) 07/08/2021   CALCIUM 10.5 (H) 07/26/2021   PHOS 5.8 (H) 10/26/2020    We will continue to monitor bone minerals during this admission.    LOS: 1 [place] 7/21/20232:17 PM

## 2021-07-27 NOTE — Assessment & Plan Note (Addendum)
Last hemoglobin 9.5. 

## 2021-07-27 NOTE — Progress Notes (Signed)
Pt hd start at 1618. Pt alert, stable, no c/o. Catheter lines reversed d/t high pressures. Primary RN present. Labs drawn. Safety maintained.

## 2021-07-27 NOTE — Progress Notes (Signed)
Post HD vitals

## 2021-07-27 NOTE — Significant Event (Signed)
Rapid Response Event Note   Reason for Call :  Chest pain, shortness of breath, hypoxia, increased work of breathing  Initial Focused Assessment:  Rapid response RN arrived in patient's dialysis bay with patient on ED stretcher (inpatient holding for available bed). Patient with very increased work of breathing and tachypnea. Oxygen saturations Repeating that she can't breathe and her chest hurts. Per dialysis staff patient, only had about 30 minutes of her treatment before her work of breathing and new onset chest pain caused them to have to end the treatment and call the rapid response. Lungs with coarse crackles on right, diminished on left.  Interventions:  Placed patient on NRB mask at 15L which brought patient's oxygen saturations up more consistently to upper 80s and low 80s. 1 sublingual nitro given to patient with no change in BP or chest pain. Dialysis RN gave labetolol dose. 12 lead EKG obtained. RT obtained bipap and placed on patient. After bipap placed and settings adjusted by RT, patient became much calmer and work of breathing was markedly decreased. Oxygen saturations 100% on the 100% FiO2 bipap when arrived in ICU 18.  Plan of Care:  Patient transferred to ICU 18. Report given to [facility] RN from this RN.  Event Summary:   MD Notified: Dr. Leslye Peer, Colon Flattery NP, Dr. Milon Dikes Call Time: 11:28 Arrival Time: 11:30 End Time: 12:06 Stephanie Acre, RN

## 2021-07-27 NOTE — Assessment & Plan Note (Addendum)
Echocardiogram shows an EF of 50 to 55%.  This is an improvement from the last EF of 40 to 45%.  Dialysis to manage fluid.  The patient does not urinate so dialysis is the only way to manage fluid.  Change Toprol over to Coreg 12.5 mg twice a day.  Patient also on Entresto.  Add hydralazine today.  Continue Imdur.

## 2021-07-27 NOTE — Assessment & Plan Note (Addendum)
Patient hypoxic to 88% on room air, tachypneic and and short of breath requiring transfer to the ICU on 07/27/2021 for BiPAP.  She required urgent dialysis.  This morning patient was on high flow nasal cannula 4 L and was able to come off oxygen completely.  The patient was able to come off the oxygen completely during the day.

## 2021-07-27 NOTE — Progress Notes (Signed)
Pre hd info 

## 2021-07-27 NOTE — Assessment & Plan Note (Signed)
Continue Coumadin and metoprolol

## 2021-07-27 NOTE — ED Notes (Signed)
Pt. To dialysis 

## 2021-07-27 NOTE — Consult Note (Addendum)
Cardiology Consultation:   Patient ID: Teresa Cook MRN: [id number]; DOB: 06/04/1955  Admit date: 07/26/2021 Date of Consult: 07/27/2021  PCP:  [facility] [place] [facility] Cardiologist: Dr. Garen Lah  Patient Profile:   Teresa Cook is a 66 y.o. female with a hx of HTN, HLD, ESRD on HD MWF, paroxysmal Afib on coumadin, HFrEF, nonobstructive CAD on cath in 03/2020 who is being seen 07/27/2021 for the evaluation of shortness of breath at the request of Dr. Leslye Peer.  History of Present Illness:   Teresa Cook is followed by Dr. Garen Lah for the above cardiac issues. She was previously seen by [facility] cardiology. History mainly obtained through chart review given difficulty speaking given Bipap.  Outside Echo 2020 showed EF 55%. Outside [place] in 2021 showed no ischemia.   She presented to the hospital 01/2018 with DOE. Work up showed CXR with interstitial edema. Diagnosed with volume overload, managed with Bipap. Echo 01/2020 showed LVEF 40% and aortic valve calcification.   Last seen 02/2020 and was feeling well. She was scheduled for R/L hart cath and started on Entresto. R/L heart cath showed 1V CAD with 60% stenosis in the mLCx, RHC showed normal filling pressures, normal pulmonary pressure and normal CO. Recommendations for aggressive medical therapy with considerate of revascularization for refractory angina.   The patient presented to the ER 7/21 for shortness of breath for 1 day. Also reported orthopnea chest tightness and cough. She denied fever or chills. No nausea or vomiting. She denied missing HD and was taking all her BP medications. EMS found her with O2 sats 88% on RA and she was placed on 3L O2.   In the ER BP was 190/128 with RR 22-29. She was afebrile. WBC 6.9, Hgb 9.6, INR 2.6. HS trop 28. BNP 1390. CXR showed increased interstitial markings, favoring atypical infection over interstitial edema. EKG showed NSR with 91bpm.  Anterior q waves, LVH. She was given Nitropaste and admitted.   ON 7/21 she was at HD for about 30 minutes when she became short of breath, with hypoxia, chest pain and increased work of breathing. Rapid response was called who applied NRB with improvement of O2 SATS.  1 SL NTG was given with no change in BP.  She was given one dose of labetolol. She was ultimately placed on Bipap and transferred to the ICU. CXR showed new extensive opacities throughout the right lung, suspected edema.  Past Medical History:  Diagnosis Date   Anemia    Anxiety    Breast cancer ([place]) 01/2016   bilateral   CHF (congestive heart failure) (HCC)    Chronic kidney disease    Depression    Dialysis patient [facility])    DVT (deep venous thrombosis) ([place])    left leg   DVT (deep venous thrombosis) ([place]) 1985   right thigh   Dysrhythmia    Gout    Headache    HTN (hypertension)    Hypertension    Parathyroid abnormality (HCC)    Parathyroid disease ([place])    Pneumonia 12/2015   Psoriasis    Renal insufficiency    Sickle cell trait ([place])    traits    Past Surgical History:  Procedure Laterality Date   ABDOMINAL HYSTERECTOMY  1980   APPENDECTOMY     BREAST BIOPSY Left 10/28/2013   benign   BREAST EXCISIONAL BIOPSY Left 2002   benign   CORONARY ANGIOGRAPHY N/A 04/04/2020   Procedure: CORONARY ANGIOGRAPHY;  Surgeon: Fletcher Anon,  Mertie Clause, MD;  Location: [place] [facility];  Service: Cardiovascular;  Laterality: N/A;   INSERTION OF DIALYSIS CATHETER  2014   LIPOMA EXCISION N/A 01/23/2016   Procedure: EXCISION LIPOMA;  Surgeon: Hubbard Robinson, MD;  Location: [facility] OR[facility];  Service: General;  Laterality: N/A;   MASTECTOMY W/ SENTINEL NODE BIOPSY Bilateral 01/23/2016   Procedure: bilateral MASTECTOMY WITH  bilateral SENTINEL LYMPH NODE BIOPSY possible left axillary node dissection forehead lipoma removal;  Surgeon: Hubbard Robinson, MD;  Location: [facility] OR[facility];  Service: General;  Laterality: Bilateral;   PARTIAL  HYSTERECTOMY     PERIPHERAL VASCULAR CATHETERIZATION N/A 12/25/2015   Procedure: Dialysis/Perma Catheter Insertion;  Surgeon: Algernon Huxley, MD;  Location: [place] [facility];  Service: Cardiovascular;  Laterality: N/A;   PERIPHERAL VASCULAR CATHETERIZATION Left 01/22/2016   Procedure: Dialysis/Perma Catheter Insertion;  Surgeon: Algernon Huxley, MD;  Location: [place] [facility];  Service: Cardiovascular;  Laterality: Left;   PERIPHERAL VASCULAR CATHETERIZATION N/A 01/26/2016   Procedure: Dialysis/Perma Catheter Insertion;  Surgeon: Katha Cabal, MD;  Location: [place] [facility];  Service: Cardiovascular;  Laterality: N/A;   PORT-A-CATH REMOVAL N/A 12/20/2015   Procedure: REMOVAL PORT-A-CATH;  Surgeon: Hubbard Robinson, MD;  Location: [facility];  Service: General;  Laterality: N/A;  left     PORTACATH PLACEMENT Left 08/21/2015   Procedure: INSERTION PORT-A-CATH;  Surgeon: Hubbard Robinson, MD;  Location: [facility];  Service: General;  Laterality: Left;   REMOVAL OF A DIALYSIS CATHETER  2017   RIGHT HEART CATH N/A 04/04/2020   Procedure: RIGHT HEART CATH;  Surgeon: Wellington Hampshire, MD;  Location: [place] [facility];  Service: Cardiovascular;  Laterality: N/A;     Home Medications:  Prior to Admission medications   Medication Sig Start Date End Date Taking? Authorizing Provider  aspirin-acetaminophen-caffeine (EXCEDRIN MIGRAINE) [phone] MG tablet Take 1-2 tablets by mouth every 6 (six) hours as needed for headache.   Yes [provider]  b complex-vitamin c-folic acid (NEPHRO-VITE) 0.8 MG TABS tablet Take 1 tablet by mouth daily.   Yes [provider]  cinacalcet (SENSIPAR) 60 MG tablet Take 60 mg by mouth daily before lunch.   Yes [provider]  metoprolol succinate (TOPROL-XL) 50 MG 24 hr tablet Take 1 tablet (50 mg total) by mouth daily. Take with or immediately following a meal. 01/26/20  Yes Danford, Suann Larry, MD  pantoprazole (PROTONIX) 40 MG  tablet Take 1 tablet (40 mg total) by mouth daily. 01/26/20  Yes Danford, Suann Larry, MD  sacubitril-valsartan (ENTRESTO) 24-26 MG Take 1 tablet by mouth 2 (two) times daily. 02/15/21  Yes Kate Sable, MD  sertraline (ZOLOFT) 100 MG tablet Take 1.5 tablets (150 mg total) by mouth daily. Patient taking differently: Take 100-150 mg by mouth daily. 01/26/20  Yes Danford, Suann Larry, MD  sevelamer carbonate (RENVELA) 800 MG tablet Take 1,600 mg by mouth 2 (two) times daily. Pt takes 2 Tabs BID wit meals   Yes [provider]  traZODone (DESYREL) 50 MG tablet Take 50 mg by mouth at bedtime. 03/01/21  Yes [provider]  warfarin (COUMADIN) 5 MG tablet Take 1-1.5 tablets (5-7.5 mg total) by mouth See admin instructions. Take 1 tablet ('5mg'$ ) by mouth every Monday, Wednesday, Friday, Saturday and Sunday evening and take 1.5  tablets (7.'5mg'$ ) by mouth every Tuesday and Thursday evening 10/26/20  Yes Sharen Hones, MD  albuterol (VENTOLIN HFA) 108 (90 Base) MCG/ACT inhaler Inhale 2 puffs into  the lungs every 4 (four) hours as needed for wheezing or shortness of breath. Patient not taking: Reported on 07/27/2021 10/26/20   Sharen Hones, MD  loperamide (IMODIUM) 2 MG capsule Take 1 capsule (2 mg total) by mouth 2 (two) times daily as needed for diarrhea or loose stools. Patient not taking: Reported on 07/27/2021 10/26/20   Sharen Hones, MD  zolpidem (AMBIEN) 10 MG tablet Take 1 tablet (10 mg total) by mouth at bedtime. 01/26/20   Danford, Suann Larry, MD    Inpatient Medications: Scheduled Meds:  azithromycin  500 mg Oral Daily   calcium carbonate  2 tablet Oral Once   Chlorhexidine Gluconate Cloth  6 each Topical Q0600   cinacalcet  60 mg Oral QAC lunch   heparin sodium (porcine)       labetalol       methylPREDNISolone (SOLU-MEDROL) injection  40 mg Intravenous Daily   metoprolol succinate  50 mg Oral Daily   nitroGLYCERIN  0.5 inch Topical Q6H   pantoprazole  40 mg Oral Daily    sacubitril-valsartan  1 tablet Oral BID   sertraline  150 mg Oral Daily   sevelamer carbonate  2,400 mg Oral TID with meals   traZODone  50 mg Oral QHS   warfarin  4 mg Oral ONCE-1600   Warfarin - Pharmacist Dosing Inpatient   Does not apply q1600   Continuous Infusions:  anticoagulant sodium citrate     PRN Meds: acetaminophen **OR** acetaminophen, albuterol, alteplase, anticoagulant sodium citrate, aspirin-acetaminophen-caffeine, guaiFENesin-dextromethorphan, heparin, heparin sodium (porcine), hydrALAZINE, labetalol, lidocaine (PF), lidocaine-prilocaine, nitroGLYCERIN, ondansetron **OR** ondansetron (ZOFRAN) IV, pentafluoroprop-tetrafluoroeth, sevelamer carbonate  Allergies:    Allergies  Allergen Reactions   Gabapentin Other (See Comments)    Seizure   Adhesive [Tape] Itching    Silk tape is ok to use.    Social History:   Social History   Socioeconomic History   Marital status: Widowed    Spouse name: Not on file   Number of children: Not on file   Years of education: Not on file   Highest education level: Not on file  Occupational History   Occupation: disabled  Tobacco Use   Smoking status: Never   Smokeless tobacco: Never  Vaping Use   Vaping Use: Never used  Substance and Sexual Activity   Alcohol use: No    Alcohol/week: 0.0 standard drinks of alcohol   Drug use: No   Sexual activity: Never    Birth control/protection: Surgical  Other Topics Concern   Not on file  Social History Narrative   ** Merged History Encounter **       Social Determinants of Health   Financial Resource Strain: Not on file  Food Insecurity: Not on file  Transportation Needs: Not on file  Physical Activity: Not on file  Stress: Not on file  Social Connections: Not on file  Intimate Partner Violence: Not on file    Family History:    Family History  Problem Relation Age of Onset   Stroke Mother    CVA Mother    Hypertension Mother    Hypertension Father     Hypertension Sister    Diabetes Sister    Cancer Sister 73       Breast   Stroke Brother    Hypertension Brother    Breast cancer Maternal Aunt 70     ROS:  Please see the history of present illness.   All other ROS reviewed and negative.  Physical Exam/Data:   Vitals:   07/27/21 1145 07/27/21 1148 07/27/21 1206 07/27/21 1216  BP: (!) 182/124  (!) 152/119   Pulse: 87  (!) 48 93  Resp: (!) 35 (!) 40 (!) 24 (!) 25  Temp:      TempSrc:      SpO2: (!) 85% 92% 100% 100%  Weight:   54.8 kg   Height:        Intake/Output Summary (Last 24 hours) at 07/27/2021 1250 Last data filed at 07/27/2021 1125 Gross per 24 hour  Intake --  Output 0 ml  Net 0 ml      07/27/2021   12:06 PM 07/26/2021   10:01 PM 07/10/2021    4:44 AM  Last 3 Weights  Weight (lbs) 120 lb 13 oz 126 lb 127 lb 13.9 oz  Weight (kg) 54.8 kg 57.153 kg 58 kg     Body mass index is 20.1 kg/m.  General:  Well nourished, well developed, in no acute distress HEENT: normal Neck: + JVD Vascular: No carotid bruits; Distal pulses 2+ bilaterally Cardiac:  normal S1, S2; RRR; no murmur  Lungs: decreased at bases  Abd: soft, nontender, no hepatomegaly  Ext: no edema Musculoskeletal:  No deformities, BUE and BLE strength normal and equal Skin: warm and dry  Neuro:  CNs 2-12 intact, no focal abnormalities noted Psych:  Normal affect   EKG:  The EKG was personally reviewed and demonstrates:  NSR 91bpm, anterior infarct, LVH Telemetry:  Telemetry was personally reviewed and demonstrates:  ST HR 100bpm  Relevant CV Studies:  Cardiac cath 03/2020 Prox Cx to Mid Cx lesion is 60% stenosed.   1.  Borderline one-vessel coronary artery disease with 60% stenosis in the mid left circumflex. 2.  Right heart catheterization showed normal filling pressures, normal pulmonary pressure and normal cardiac output.   Recommendations: Recommend continuing aggressive medical therapy.  Volume status appears good. If the patient has  refractory angina in spite of optimal medical therapy, FFR guided revascularization of the left circumflex can be considered. Warfarin can be resumed today. Avoid catheterization via the right radial artery in the future given significant tortuosity of the innominate artery with severe calcifications in the aortic arch.  Coronary Diagrams  Diagnostic Dominance: Right      Echo 01/2020  1. Left ventricular ejection fraction, by estimation, is 40 to 45%. Left  ventricular ejection fraction by PLAX is 43 %. The left ventricle has  mildly decreased function. The left ventricle demonstrates regional wall  motion abnormalities (see scoring  diagram/findings for description). There is moderate left ventricular  hypertrophy. Left ventricular diastolic parameters were normal.   2. Right ventricular systolic function is normal. The right ventricular  size is mildly enlarged.   3. Left atrial size was mildly dilated.   4. The mitral valve is grossly normal. Mild mitral valve regurgitation.   5. The aortic valve is calcified. Aortic valve regurgitation is trivial.   Laboratory Data:  High Sensitivity Troponin:   Recent Labs  Lab 07/08/21 1419 07/08/21 1705 07/08/21 2203 07/26/21 2218 07/27/21 0011  TROPONINIHS 37* 43* 60* 28* 30*     Chemistry Recent Labs  Lab 07/26/21 2218  NA 142  K 3.6  CL 106  CO2 27  GLUCOSE 102*  BUN 36*  CREATININE 8.32*  CALCIUM 10.5*  GFRNONAA 5*  ANIONGAP 9    No results for input(s): "PROT", "ALBUMIN", "AST", "ALT", "ALKPHOS", "BILITOT" in the last 168 hours. Lipids No results for input(s): "  CHOL", "TRIG", "HDL", "LABVLDL", "LDLCALC", "CHOLHDL" in the last 168 hours.  Hematology Recent Labs  Lab 07/26/21 2218 07/27/21 0454  WBC 6.9 6.5  RBC 3.45* 3.12*  HGB 9.6* 8.6*  HCT 29.9* 27.1*  MCV 86.7 86.9  MCH 27.8 27.6  MCHC 32.1 31.7  RDW 17.6* 17.5*  PLT 126* 161   Thyroid No results for input(s): "TSH", "FREET4" in the last 168 hours.   BNP Recent Labs  Lab 07/26/21 2210  BNP 1,390.4*    DDimer No results for input(s): "DDIMER" in the last 168 hours.   Radiology/Studies:  DG Chest Port 1 View  Result Date: 07/27/2021 CLINICAL DATA:  Chest pain and shortness of breath EXAM: PORTABLE CHEST 1 VIEW COMPARISON:  Chest radiograph 1 day prior FINDINGS: The left-sided vascular catheter is stable terminating in the right atrium. The cardiomediastinal silhouette is stable. Extensive opacities are seen throughout the right lung which have significantly worsened since the study from 1 day prior. Aeration of the left lung is not significantly changed. There is no significant pleural effusion. There is no pneumothorax. The bones are stable. IMPRESSION: New extensive opacities throughout the right lung may reflect new asymmetric alveolar edema, infection, or combination thereof. Electronically Signed   By: Valetta Mole M.D.   On: 07/27/2021 12:06   DG Chest Port 1 View  Result Date: 07/26/2021 CLINICAL DATA:  Shortness of breath EXAM: PORTABLE CHEST 1 VIEW COMPARISON:  07/08/2021 FINDINGS: Increased interstitial markings, chronic, although mildly progressive, particularly in the right lower lobe. This appearance favors atypical infection over interstitial edema. No definite pleural effusions. No pneumothorax. The heart is top-normal in size.  Thoracic aortic atherosclerosis. Left IJ dual lumen dialysis catheter. IMPRESSION: Increased interstitial markings, mildly progressive, favoring atypical infection over interstitial edema. Electronically Signed   By: Julian Hy M.D.   On: 07/26/2021 22:17     Assessment and Plan:   Acute respiratory failure Flash pulmonary edema - hypoxic to 88% on RA, tachypneic and SOB on presentation 7/20 - CXR showed increasing interstitial markings - on 7/12 during HD patient had worsening respiratory status, placed  on Bipap, given SL NTG and labetalol dose and transferred to the ICU with CCM  consult - she does not make urine, Volume managed by HD - Nitro paste for BP applied with improvement, IV NTG was ordered - still on Bipap, wean as able - echo ordered. Last echo showed LVEF 40-45% - per CCM/IM  Acute on chronic systolic CHF - Last echo 02/7251 showed LVEF 40-45%, moderate LVH, mild MR. - Heart cath showed 1V CAD 60% Cx lesion, RHC showed normal filling pressures, normal pulmonary pressures and normal CO.  - acute heart failure in the setting of severe hypertension - Entresto 24-'46mg'$ BID, Toprol '50mg'$  daily - Volume managed by HD  Hypertensive Urgency - systolics up to the 664Q, she reports compliance with medications PTA Entresto and toprol.  -  flash pulmonary edema during HD as above - continue Toprol '50mg'$  daiy and Entresto 24-'26mg'$ BID - labetolol injection as needed - IV nitro drip ordered  Elevated troponin - chest pain reported this AM at HD  - still reports some left flank chest pain - HS trop 37, which is mildly up from presentation - continue to trend - LHC in 2022 showed 60% p-m Cx stenosis - would not start IV heparin - suspect more demand ischemia in the setting of multiple acute issues. - echo ordered as above - check EKG - would hold on further ischemic work-up  at this time, unless echo shows further reduction in EF. No ASA with warfarin.  Atypical PNA - abx per IM  ESRD on HD - plan for dialysis in the ICU  Anemia - Hgb 8.6, likely contributing to symptoms.   Paroxysmal Afib - Remains in NSR - continue coumadin per pharmacy  For questions or updates, please contact [place] Please consult [email or web address] for contact info under    Signed, Mikael Skoda Ninfa Meeker, PA-C  07/27/2021 12:50 PM

## 2021-07-27 NOTE — Assessment & Plan Note (Addendum)
With history of chronic headaches

## 2021-07-27 NOTE — ED Notes (Signed)
Physician at bedside.

## 2021-07-27 NOTE — Progress Notes (Signed)
HD end 

## 2021-07-27 NOTE — Progress Notes (Signed)
Pt alert, c/o angina 9/10, emesis, sinus tachy, uf goal decreased to 1 L and bfr decreased to 300. Gwyneth Revels, NP present and aware. Safety maintained and will continue to monitor.

## 2021-07-27 NOTE — Progress Notes (Signed)
Pt HR >120, pt c/o anxiety and is restless. Primary RN present and aware. Will contact MD for anti anxiety medications. Safety maintained.

## 2021-07-27 NOTE — Progress Notes (Signed)
Pre HD RN assessment 

## 2021-07-27 NOTE — Progress Notes (Signed)
Pt HD ended at 1125. Pt states, "I can't breathe." Use of accessory muscles, labored and tachy respirations. Continued c/o angina 9/10. RR called at 1127. Gwyneth Revels, NP present and aware of situation. Vitals recorded. HD team at bedside. Safety maintained.   Final HD results:  0 fluid removed HD tx 37 minutes BVP 13.1 L

## 2021-07-27 NOTE — Assessment & Plan Note (Signed)
Not currently on antiepileptic drugs

## 2021-07-27 NOTE — Assessment & Plan Note (Addendum)
Patient had an extra dialysis session today.

## 2021-07-27 NOTE — Assessment & Plan Note (Addendum)
We will give a dose of Solu-Medrol.  On Zithromax.  Viral respiratory panel also ordered.

## 2021-07-27 NOTE — Progress Notes (Signed)
ANTICOAGULATION CONSULT NOTE - Initial Consult  Pharmacy Consult for warfarin Indication: atrial fibrillation, hx DVT  Allergies  Allergen Reactions   Gabapentin Other (See Comments)    Seizure   Adhesive [Tape] Itching    Silk tape is ok to use.    Patient Measurements: Height: '5\' 5"'$  (165.1 cm) Weight: 57.2 kg (126 lb) IBW/kg (Calculated) : 57 Heparin Dosing Weight:    Vital Signs: Temp: 98.8 F (37.1 C) (07/20 2311) Temp Source: Oral (07/20 2311) BP: 142/94 (07/21 0300) Pulse Rate: 75 (07/21 0300)  Labs: Recent Labs    07/26/21 2218 07/27/21 0011  HGB 9.6*  --   HCT 29.9*  --   PLT 126*  --   LABPROT 27.6*  --   INR 2.6*  --   CREATININE 8.32*  --   TROPONINIHS 28* 30*    Estimated Creatinine Clearance: 5.9 mL/min (A) (by C-G formula based on SCr of 8.32 mg/dL (H)).   Medical History: Past Medical History:  Diagnosis Date   Anemia    Anxiety    Breast cancer ([place]) 01/2016   bilateral   CHF (congestive heart failure) (HCC)    Chronic kidney disease    Depression    Dialysis patient [facility])    DVT (deep venous thrombosis) (HCC)    left leg   DVT (deep venous thrombosis) ([place]) 1985   right thigh   Dysrhythmia    Gout    Headache    HTN (hypertension)    Hypertension    Parathyroid abnormality (HCC)    Parathyroid disease ([place])    Pneumonia 12/2015   Psoriasis    Renal insufficiency    Sickle cell trait (HCC)    traits    Medications:  Scheduled:   azithromycin  500 mg Oral Daily   cinacalcet  60 mg Oral QAC lunch   metoprolol succinate  50 mg Oral Daily   nitroGLYCERIN  0.5 inch Topical Q6H   pantoprazole  40 mg Oral Daily   sacubitril-valsartan  1 tablet Oral BID   sertraline  150 mg Oral Daily   sevelamer carbonate  800-2,400 mg Oral See admin instructions   traZODone  50 mg Oral QHS   warfarin  4 mg Oral ONCE-1600   Warfarin - Pharmacist Dosing Inpatient   Does not apply q1600   zolpidem  5 mg Oral QHS   Infusions:    Assessment: 66 yo F to continue warfarin therapy for Afib Home regimen: 5 mg M,W,F,Sa,Su and 7.5 mg T,TH Hgb 9.6  Plt 126  7/20 INR 2.6   DDI: azithromycin  Goal of Therapy:  INR 2-3 Monitor platelets by anticoagulation protocol: Yes   Plan:  Will order Warfarin 4 mg x 1 dose (slightly decreased from home dose as pt also started on azithromycin.) Per RN pt said she took her warfarin dose on 7/20 F/u INR and CBC per protocol  Kamala Kolton A 07/27/2021,4:38 AM

## 2021-07-27 NOTE — Consult Note (Signed)
NAME:  Teresa Cook, MRN:  [id number], DOB:  07-20-1955, LOS: 1 ADMISSION DATE:  07/26/2021, CONSULTATION DATE: July 27, 2021 REFERRING MD: Hospitalist service, CHIEF COMPLAINT: Respiratory distress  History of Present Illness:  Patient 66 year old African-American female Past medical history of end-stage renal disease hemodialysis hypertension gout who presented with progressive shortness of breath she also has history of A-fib on anticoagulation Coumadin she had PND's orthopnea and chest tightness she has nitro and coronary artery disease when she was trying to be dialyzed in the dialysis suite she had severe respiratory distress hypertension and what seems to be flash pulmonary edema so she was put on BiPAP and given dose of nitro and brought up to the ICU in the ICU she is on BiPAP in moderate respiratory distress no fever chills rigors no nausea vomiting diarrhea. Objective   Blood pressure (!) 152/119, pulse 93, temperature 97.8 F (36.6 C), temperature source Oral, resp. rate (!) 25, height '5\' 5"'$  (1.651 m), weight 54.8 kg, SpO2 100 %.        Intake/Output Summary (Last 24 hours) at 07/27/2021 1235 Last data filed at 07/27/2021 1125 Gross per 24 hour  Intake --  Output 0 ml  Net 0 ml   Filed Weights   07/26/21 2201 07/27/21 1206  Weight: 57.2 kg 54.8 kg    Examination: General: Moderate acute distress from shortness of breath using accessory muscles HEENT:       Eyes: PERRL, EOMI, no scleral icterus.       ENT: No discharge from the ears and nose, no pharynx injection, no tonsillar enlargement.        Neck: positive JVD, no bruit, no mass felt. Heme: No neck lymph node enlargement. Cardiac: S1/S2, RRR, No murmurs, No gallops or rubs. Respiratory: Has crackles on auscultation. GI: Soft, nondistended, nontender, no rebound pain, no organomegaly, BS present. GU: No hematuria Ext: No pitting leg edema bilaterally. 1+DP/PT pulse bilaterally. Musculoskeletal: No joint  deformities, No joint redness or warmth, no limitation of ROM in spin. Skin: No rashes.  Neuro: Alert, oriented [facility] Problem list     Assessment & Plan:    -- Severe respiratory distress associated with flash pulmonary edema start patient on BiPAP nitro for systolic blood pressure of 160 and will get an echo to bedside diagnosis  --History of A-fib continue Coumadin  --Possible atypical pneumonia continue with azithromycin wait for cultures  --All other questions managed by the primary team call us with question thank you for this consultation.  Best Practice (right click and "Reselect all SmartList Selections" daily)    Labs   CBC: Recent Labs  Lab 07/26/21 2218 07/27/21 0454  WBC 6.9 6.5  NEUTROABS 5.2  --   HGB 9.6* 8.6*  HCT 29.9* 27.1*  MCV 86.7 86.9  PLT 126* 366    Basic Metabolic Panel: Recent Labs  Lab 07/26/21 2218  NA 142  K 3.6  CL 106  CO2 27  GLUCOSE 102*  BUN 36*  CREATININE 8.32*  CALCIUM 10.5*   GFR: Estimated Creatinine Clearance: 5.7 mL/min (A) (by C-G formula based on SCr of 8.32 mg/dL (H)). Recent Labs  Lab 07/26/21 2210 07/26/21 2218 07/27/21 0454  PROCALCITON <0.10  --   --   WBC  --  6.9 6.5    Liver Function Tests: No results for input(s): "AST", "ALT", "ALKPHOS", "BILITOT", "PROT", "ALBUMIN" in the last 168 hours. No results for input(s): "LIPASE", "AMYLASE" in the last 168 hours. No results for  input(s): "AMMONIA" in the last 168 hours.  ABG    Component Value Date/Time   PHART 7.35 07/27/2021 1218   PCO2ART 41 07/27/2021 1218   PO2ART 274 (H) 07/27/2021 1218   HCO3 22.6 07/27/2021 1218   ACIDBASEDEF 2.9 (H) 07/27/2021 1218   O2SAT 99.9 07/27/2021 1218     Coagulation Profile: Recent Labs  Lab 07/26/21 2218 07/27/21 0454  INR 2.6* 2.8*    Cardiac Enzymes: No results for input(s): "CKTOTAL", "CKMB", "CKMBINDEX", "TROPONINI" in the last 168 hours.  HbA1C: Hemoglobin A1C  Date/Time Value  Ref Range Status  12/29/2013 05:04 AM 5.0 4.2 - 6.3 % Final    Comment:    The American Diabetes Association recommends that a primary goal of therapy should be <7% and that physicians should reevaluate the treatment regimen in patients with HbA1c values consistently >8%.    Hgb A1c MFr Bld  Date/Time Value Ref Range Status  07/08/2021 05:05 PM 4.9 4.8 - 5.6 % Final    Comment:    (NOTE)         Prediabetes: 5.7 - 6.4         Diabetes: >6.4         Glycemic control for adults with diabetes: <7.0   08/31/2015 04:37 AM  4.0 - 6.0 % Final   UNABLE TO REPORT A1C DUE TO UNKNOWN INTERFERING FACTOR CAUSING THE ANALYTICAL RANGE TO BE OUTSIDE OF ANALYZER RANGE.  SAMPLE SENT TO [facility] FOR AN ALTERNATIVE METHOD.    CBG: Recent Labs  Lab 07/27/21 1207  [place]*    Review of Systems:   Unable to obtain due to patient condition  Past Medical History:  She,  has a past medical history of Anemia, Anxiety, Breast cancer ([place]) (01/2016), CHF (congestive heart failure) ([place]), Chronic kidney disease, Depression, Dialysis patient [facility]), DVT (deep venous thrombosis) ([place]), DVT (deep venous thrombosis) ([place]) (1985), Dysrhythmia, Gout, Headache, HTN (hypertension), Hypertension, Parathyroid abnormality ([place]), Parathyroid disease ([place]), Pneumonia (12/2015), Psoriasis, Renal insufficiency, and Sickle cell trait ([place]).   Surgical History:   Past Surgical History:  Procedure Laterality Date   ABDOMINAL HYSTERECTOMY  1980   APPENDECTOMY     BREAST BIOPSY Left 10/28/2013   benign   BREAST EXCISIONAL BIOPSY Left 2002   benign   CORONARY ANGIOGRAPHY N/A 04/04/2020   Procedure: CORONARY ANGIOGRAPHY;  Surgeon: Wellington Hampshire, MD;  Location: [place] [facility];  Service: Cardiovascular;  Laterality: N/A;   INSERTION OF DIALYSIS CATHETER  2014   LIPOMA EXCISION N/A 01/23/2016   Procedure: EXCISION LIPOMA;  Surgeon: Hubbard Robinson, MD;  Location: [facility];  Service: General;  Laterality: N/A;    MASTECTOMY W/ SENTINEL NODE BIOPSY Bilateral 01/23/2016   Procedure: bilateral MASTECTOMY WITH  bilateral SENTINEL LYMPH NODE BIOPSY possible left axillary node dissection forehead lipoma removal;  Surgeon: Hubbard Robinson, MD;  Location: [facility];  Service: General;  Laterality: Bilateral;   PARTIAL HYSTERECTOMY     PERIPHERAL VASCULAR CATHETERIZATION N/A 12/25/2015   Procedure: Dialysis/Perma Catheter Insertion;  Surgeon: Algernon Huxley, MD;  Location: [place] [facility];  Service: Cardiovascular;  Laterality: N/A;   PERIPHERAL VASCULAR CATHETERIZATION Left 01/22/2016   Procedure: Dialysis/Perma Catheter Insertion;  Surgeon: Algernon Huxley, MD;  Location: [place] [facility];  Service: Cardiovascular;  Laterality: Left;   PERIPHERAL VASCULAR CATHETERIZATION N/A 01/26/2016   Procedure: Dialysis/Perma Catheter Insertion;  Surgeon: Katha Cabal, MD;  Location: [place] [facility];  Service: Cardiovascular;  Laterality: N/A;  PORT-A-CATH REMOVAL N/A 12/20/2015   Procedure: REMOVAL PORT-A-CATH;  Surgeon: Hubbard Robinson, MD;  Location: [facility];  Service: General;  Laterality: N/A;  left     PORTACATH PLACEMENT Left 08/21/2015   Procedure: INSERTION PORT-A-CATH;  Surgeon: Hubbard Robinson, MD;  Location: [facility];  Service: General;  Laterality: Left;   REMOVAL OF A DIALYSIS CATHETER  2017   RIGHT HEART CATH N/A 04/04/2020   Procedure: RIGHT HEART CATH;  Surgeon: Wellington Hampshire, MD;  Location: [place] [facility];  Service: Cardiovascular;  Laterality: N/A;     Social History:   reports that she has never smoked. She has never used smokeless tobacco. She reports that she does not drink alcohol and does not use drugs.   Family History:  Her family history includes Breast cancer (age of onset: 20) in her maternal aunt; CVA in her mother; Cancer (age of onset: 73) in her sister; Diabetes in her sister; Hypertension in her brother, father, mother, and sister; Stroke in her brother and  mother.   Allergies Allergies  Allergen Reactions   Gabapentin Other (See Comments)    Seizure   Adhesive [Tape] Itching    Silk tape is ok to use.     Home Medications  Prior to Admission medications   Medication Sig Start Date End Date Taking? Authorizing Provider  aspirin-acetaminophen-caffeine (EXCEDRIN MIGRAINE) 561 100 8798 MG tablet Take 1-2 tablets by mouth every 6 (six) hours as needed for headache.   Yes [provider]  b complex-vitamin c-folic acid (NEPHRO-VITE) 0.8 MG TABS tablet Take 1 tablet by mouth daily.   Yes [provider]  cinacalcet (SENSIPAR) 60 MG tablet Take 60 mg by mouth daily before lunch.   Yes [provider]  metoprolol succinate (TOPROL-XL) 50 MG 24 hr tablet Take 1 tablet (50 mg total) by mouth daily. Take with or immediately following a meal. 01/26/20  Yes Danford, Suann Larry, MD  pantoprazole (PROTONIX) 40 MG tablet Take 1 tablet (40 mg total) by mouth daily. 01/26/20  Yes Danford, Suann Larry, MD  sacubitril-valsartan (ENTRESTO) 24-26 MG Take 1 tablet by mouth 2 (two) times daily. 02/15/21  Yes Kate Sable, MD  sertraline (ZOLOFT) 100 MG tablet Take 1.5 tablets (150 mg total) by mouth daily. Patient taking differently: Take 100-150 mg by mouth daily. 01/26/20  Yes Danford, Suann Larry, MD  sevelamer carbonate (RENVELA) 800 MG tablet Take 1,600 mg by mouth 2 (two) times daily. Pt takes 2 Tabs BID wit meals   Yes [provider]  traZODone (DESYREL) 50 MG tablet Take 50 mg by mouth at bedtime. 03/01/21  Yes [provider]  warfarin (COUMADIN) 5 MG tablet Take 1-1.5 tablets (5-7.5 mg total) by mouth See admin instructions. Take 1 tablet ('5mg'$ ) by mouth every Monday, Wednesday, Friday, Saturday and Sunday evening and take 1.5  tablets (7.'5mg'$ ) by mouth every Tuesday and Thursday evening 10/26/20  Yes Sharen Hones, MD  albuterol (VENTOLIN HFA) 108 (90 Base) MCG/ACT inhaler Inhale 2 puffs into the lungs every 4  (four) hours as needed for wheezing or shortness of breath. Patient not taking: Reported on 07/27/2021 10/26/20   Sharen Hones, MD  loperamide (IMODIUM) 2 MG capsule Take 1 capsule (2 mg total) by mouth 2 (two) times daily as needed for diarrhea or loose stools. Patient not taking: Reported on 07/27/2021 10/26/20   Sharen Hones, MD  zolpidem (AMBIEN) 10 MG tablet Take 1 tablet (10 mg total) by mouth at bedtime. 01/26/20   Danford,  Suann Larry, MD     Critical care time: Critical care time spent the care of this patient is 38 minutes

## 2021-07-27 NOTE — Progress Notes (Signed)
Pt received anti anxiety medications. Reports effectiveness. Pt accidentally dislodged IV under blankets. Gauze applied and primary RN notified.

## 2021-07-27 NOTE — Progress Notes (Signed)
Progress Note   Patient: Teresa Cook QMV:[id number] DOB: 27-Jun-1955 DOA: 07/26/2021     1 DOS: the patient was seen and examined on 07/27/2021     Assessment and Plan: * Acute respiratory failure with hypoxia (HCC) No history of home oxygen use Patient hypoxic to 88% on room air, tachypneic and short of breath Worsening respiratory status.  Transferring to ICU with critical care consultation.  May end up needing intubation if respiratory status does not improve.  Likely from fluid overload and will need dialysis up in the ICU.  Acute on chronic systolic CHF (congestive heart failure) (HCC) Echocardiogram ordered.  Dialysis to manage fluid.  We will get cardiology consultation.  Hypertensive emergency A blood pressure reading of 190/128 last night in the emergency room.  As needed hydralazine IV.  Patient on nitroglycerin patch, metoprolol, Entresto  ESRD on dialysis [facility]) Dialysis to be done in ICU  Atypical pneumonia We will give a dose of Solu-Medrol.  On Zithromax.  Viral respiratory panel also ordered.  Anemia in ESRD (end-stage renal disease) (HCC) Last hemoglobin 8.6  Meningioma, cerebral (HCC) With history of chronic headaches  Paroxysmal A-fib (HCC) Continue Coumadin and metoprolol  Seizures (HCC) Not currently on antiepileptic drugs        Subjective: Called earlier to a rapid response.  The patient was in the dialysis unit and was having desaturations.  The patient was placed on 100% nonrebreather.  We were able to get a BiPAP.  We transferred up to the ICU.  Patient complaining of shortness of breath and feeling tired.  Physical Exam: Vitals:   07/27/21 1145 07/27/21 1148 07/27/21 1206 07/27/21 1216  BP: (!) 182/124  (!) 152/119   Pulse: 87  (!) 48 93  Resp: (!) 35 (!) 40 (!) 24 (!) 25  Temp:      TempSrc:      SpO2: (!) 85% 92% 100% 100%  Weight:   54.8 kg   Height:       Physical Exam HENT:     Head: Normocephalic.     Mouth/Throat:      Pharynx: No oropharyngeal exudate.  Eyes:     General: Lids are normal.     Conjunctiva/sclera: Conjunctivae normal.  Cardiovascular:     Rate and Rhythm: Normal rate and regular rhythm.     Heart sounds: Normal heart sounds, S1 normal and S2 normal.  Pulmonary:     Effort: Accessory muscle usage present.     Breath sounds: Examination of the right-middle field reveals decreased breath sounds and rales. Examination of the left-middle field reveals decreased breath sounds and rales. Examination of the right-lower field reveals decreased breath sounds and rales. Examination of the left-lower field reveals decreased breath sounds and rales. Decreased breath sounds and rales present.  Abdominal:     Palpations: Abdomen is soft.     Tenderness: There is no abdominal tenderness.  Musculoskeletal:     Right lower leg: Swelling present.     Left lower leg: Swelling present.  Skin:    General: Skin is warm.     Findings: No rash.  Neurological:     Mental Status: She is alert and oriented to person, place, and time.     Data Reviewed: Hemoglobin 8.6, creatinine 8.32, calcium 10.5 ABG on BiPAP shows a pH of 7.35 PO2 274 PCO2 41  Family Communication: Updated patient's daughter on the phone  Disposition: Status is: Inpatient Remains inpatient appropriate because: Transferring to ICU with respiratory distress  Planned Discharge Destination: Home    Time spent: 35 minutes.  The patient is critically ill and high risk for intubation. Case discussed with nephrology team, critical care specialist and cardiology.  Author: Loletha Grayer, MD 07/27/2021 12:51 PM  For on call review [email or web address].

## 2021-07-27 NOTE — H&P (Signed)
History and Physical    Patient: Teresa Cook BOF:[id number] DOB: 03/29/1955 DOA: 07/26/2021 DOS: the patient was seen and examined on 07/27/2021 PCP: [facility]  Patient coming from: Home  Chief Complaint:  Chief Complaint  Patient presents with   Shortness of Breath    HPI: Teresa Cook is a 66 y.o. female with medical history significant for ESRD-HD (MWF), hypertension, gout, depression with anxiety, paroxysmal A-fib on Coumadin, sCHF with EF 40-45%, bilateral breast cancer (s/p bilateral mastectomy, seizure not on medications, meningioma and chronic headaches, hyperparathyroidism recently discharged on 7/4 for management of volume overload who presents with a 1 day history of shortness of breath, orthopnea, chest tightness and cough.   She denies fevers or chills, nausea or vomiting.  She has not missed any dialysis sessions and has taken all her blood pressure medication.  On arrival of EMS O2 sat was 88% on room air and she was placed on 3 L O2 nasal cannula for transport. ED course and data review: BP in the ED as high as 190/128 with respirations 22-29.  Afebrile, pulse in the 80s to 90s.  O2 sat 88% on room air, requiring 3 L.  Unable to be weaned off O2. Labs: WBC 6.9, hemoglobin 9.6, INR 2.6.  Renal function per dialysis.  Potassium and bicarb normal.  Troponin 28, BNP 1390. EKG, personally viewed and interpreted: Sinus at 91 with no acute ST-T wave changes chest x-ray showing increased interstitial markings, mildly progressive, favoring atypical infection over interstitial edema  Patient was given a sublingual nitroglycerin and hospitalist consulted for admission.     Past Medical History:  Diagnosis Date   Anemia    Anxiety    Breast cancer ([place]) 01/2016   bilateral   CHF (congestive heart failure) (HCC)    Chronic kidney disease    Depression    Dialysis patient [facility])    DVT (deep venous thrombosis) ([place])    left leg   DVT (deep venous  thrombosis) ([place]) 1985   right thigh   Dysrhythmia    Gout    Headache    HTN (hypertension)    Hypertension    Parathyroid abnormality (HCC)    Parathyroid disease ([place])    Pneumonia 12/2015   Psoriasis    Renal insufficiency    Sickle cell trait ([place])    traits   Past Surgical History:  Procedure Laterality Date   ABDOMINAL HYSTERECTOMY  1980   APPENDECTOMY     BREAST BIOPSY Left 10/28/2013   benign   BREAST EXCISIONAL BIOPSY Left 2002   benign   CORONARY ANGIOGRAPHY N/A 04/04/2020   Procedure: CORONARY ANGIOGRAPHY;  Surgeon: Wellington Hampshire, MD;  Location: [place] [facility];  Service: Cardiovascular;  Laterality: N/A;   INSERTION OF DIALYSIS CATHETER  2014   LIPOMA EXCISION N/A 01/23/2016   Procedure: EXCISION LIPOMA;  Surgeon: Hubbard Robinson, MD;  Location: [facility];  Service: General;  Laterality: N/A;   MASTECTOMY W/ SENTINEL NODE BIOPSY Bilateral 01/23/2016   Procedure: bilateral MASTECTOMY WITH  bilateral SENTINEL LYMPH NODE BIOPSY possible left axillary node dissection forehead lipoma removal;  Surgeon: Hubbard Robinson, MD;  Location: [facility];  Service: General;  Laterality: Bilateral;   PARTIAL HYSTERECTOMY     PERIPHERAL VASCULAR CATHETERIZATION N/A 12/25/2015   Procedure: Dialysis/Perma Catheter Insertion;  Surgeon: Algernon Huxley, MD;  Location: [place] [facility];  Service: Cardiovascular;  Laterality: N/A;   PERIPHERAL VASCULAR CATHETERIZATION Left 01/22/2016   Procedure:  Dialysis/Perma Catheter Insertion;  Surgeon: Algernon Huxley, MD;  Location: [place] [facility];  Service: Cardiovascular;  Laterality: Left;   PERIPHERAL VASCULAR CATHETERIZATION N/A 01/26/2016   Procedure: Dialysis/Perma Catheter Insertion;  Surgeon: Katha Cabal, MD;  Location: [place] [facility];  Service: Cardiovascular;  Laterality: N/A;   PORT-A-CATH REMOVAL N/A 12/20/2015   Procedure: REMOVAL PORT-A-CATH;  Surgeon: Hubbard Robinson, MD;  Location: [facility];  Service:  General;  Laterality: N/A;  left     PORTACATH PLACEMENT Left 08/21/2015   Procedure: INSERTION PORT-A-CATH;  Surgeon: Hubbard Robinson, MD;  Location: [facility];  Service: General;  Laterality: Left;   REMOVAL OF A DIALYSIS CATHETER  2017   RIGHT HEART CATH N/A 04/04/2020   Procedure: RIGHT HEART CATH;  Surgeon: Wellington Hampshire, MD;  Location: [place] [facility];  Service: Cardiovascular;  Laterality: N/A;   Social History:  reports that she has never smoked. She has never used smokeless tobacco. She reports that she does not drink alcohol and does not use drugs.  Allergies  Allergen Reactions   Gabapentin Other (See Comments)    Seizure   Adhesive [Tape] Itching    Silk tape is ok to use.    Family History  Problem Relation Age of Onset   Stroke Mother    CVA Mother    Hypertension Mother    Hypertension Father    Hypertension Sister    Diabetes Sister    Cancer Sister 25       Breast   Stroke Brother    Hypertension Brother    Breast cancer Maternal Aunt 70    Prior to Admission medications   Medication Sig Start Date End Date Taking? Authorizing Provider  albuterol (VENTOLIN HFA) 108 (90 Base) MCG/ACT inhaler Inhale 2 puffs into the lungs every 4 (four) hours as needed for wheezing or shortness of breath. 10/26/20   Sharen Hones, MD  aspirin-acetaminophen-caffeine (EXCEDRIN MIGRAINE) [phone] MG tablet Take 1-2 tablets by mouth every 6 (six) hours as needed for headache.    [provider]  b complex-vitamin c-folic acid (NEPHRO-VITE) 0.8 MG TABS tablet Take 1 tablet by mouth daily.    [provider]  cinacalcet (SENSIPAR) 60 MG tablet Take 60 mg by mouth daily before lunch.    [provider]  loperamide (IMODIUM) 2 MG capsule Take 1 capsule (2 mg total) by mouth 2 (two) times daily as needed for diarrhea or loose stools. 10/26/20   Sharen Hones, MD  LORazepam (ATIVAN) 0.5 MG tablet Take 0.5 mg by mouth daily.    [provider]   metoprolol succinate (TOPROL-XL) 50 MG 24 hr tablet Take 1 tablet (50 mg total) by mouth daily. Take with or immediately following a meal. 01/26/20   Danford, Suann Larry, MD  pantoprazole (PROTONIX) 40 MG tablet Take 1 tablet (40 mg total) by mouth daily. 01/26/20   Danford, Suann Larry, MD  sacubitril-valsartan (ENTRESTO) 24-26 MG Take 1 tablet by mouth 2 (two) times daily. 02/15/21   Kate Sable, MD  sertraline (ZOLOFT) 100 MG tablet Take 1.5 tablets (150 mg total) by mouth daily. 01/26/20   Danford, Suann Larry, MD  sevelamer carbonate (RENVELA) 800 MG tablet Take 800-2,400 mg by mouth See admin instructions. Take 3 tablets ('2400mg'$ ) by mouth three times daily before meals and take 1 tablet ('800mg'$ ) by mouth daily before a snack    [provider]  traZODone (DESYREL) 50 MG tablet Take 50 mg by mouth at  bedtime. Patient not taking: Reported on 07/08/2021 03/01/21   [provider]  warfarin (COUMADIN) 5 MG tablet Take 1-1.5 tablets (5-7.5 mg total) by mouth See admin instructions. Take 1 tablet ('5mg'$ ) by mouth every Monday, Wednesday, Friday, Saturday and Sunday evening and take 1.5  tablets (7.'5mg'$ ) by mouth every Tuesday and Thursday evening 10/26/20   Sharen Hones, MD  zolpidem (AMBIEN) 10 MG tablet Take 1 tablet (10 mg total) by mouth at bedtime. 01/26/20   Edwin Dada, MD    Physical Exam: Vitals:   07/26/21 2325 07/26/21 2330 07/26/21 2335 07/26/21 2340  BP: (!) 182/118 (!) 190/128 (!) 178/127 (!) 169/116  Pulse: 81 85 86 84  Resp: 20 (!) 22 (!) 28 (!) 24  Temp:      TempSrc:      SpO2: 95% 95% 93% 92%  Weight:      Height:       Physical Exam Vitals and nursing note reviewed.  Constitutional:      General: She is not in acute distress. HENT:     Head: Normocephalic and atraumatic.  Cardiovascular:     Rate and Rhythm: Normal rate and regular rhythm.     Heart sounds: Normal heart sounds.  Pulmonary:     Effort: Pulmonary effort is normal.      Breath sounds: Normal breath sounds.  Abdominal:     Palpations: Abdomen is soft.     Tenderness: There is no abdominal tenderness.  Neurological:     Mental Status: Mental status is at baseline.     Labs on Admission: I have personally reviewed following labs and imaging studies  CBC: Recent Labs  Lab 07/26/21 2218  WBC 6.9  NEUTROABS 5.2  HGB 9.6*  HCT 29.9*  MCV 86.7  PLT 660*   Basic Metabolic Panel: Recent Labs  Lab 07/26/21 2218  NA 142  K 3.6  CL 106  CO2 27  GLUCOSE 102*  BUN 36*  CREATININE 8.32*  CALCIUM 10.5*   GFR: Estimated Creatinine Clearance: 5.9 mL/min (A) (by C-G formula based on SCr of 8.32 mg/dL (H)). Liver Function Tests: No results for input(s): "AST", "ALT", "ALKPHOS", "BILITOT", "PROT", "ALBUMIN" in the last 168 hours. No results for input(s): "LIPASE", "AMYLASE" in the last 168 hours. No results for input(s): "AMMONIA" in the last 168 hours. Coagulation Profile: Recent Labs  Lab 07/26/21 2218  INR 2.6*   Cardiac Enzymes: No results for input(s): "CKTOTAL", "CKMB", "CKMBINDEX", "TROPONINI" in the last 168 hours. BNP (last 3 results) No results for input(s): "PROBNP" in the last 8760 hours. HbA1C: No results for input(s): "HGBA1C" in the last 72 hours. CBG: No results for input(s): "GLUCAP" in the last 168 hours. Lipid Profile: No results for input(s): "CHOL", "HDL", "LDLCALC", "TRIG", "CHOLHDL", "LDLDIRECT" in the last 72 hours. Thyroid Function Tests: No results for input(s): "TSH", "T4TOTAL", "FREET4", "T3FREE", "THYROIDAB" in the last 72 hours. Anemia Panel: No results for input(s): "VITAMINB12", "FOLATE", "FERRITIN", "TIBC", "IRON", "RETICCTPCT" in the last 72 hours. Urine analysis:    Component Value Date/Time   COLORURINE STRAW (A) 09/09/2016 1521   APPEARANCEUR CLEAR (A) 09/09/2016 1521   APPEARANCEUR Clear 09/08/2013 0133   LABSPEC 1.008 09/09/2016 1521   LABSPEC 1.013 09/08/2013 0133   PHURINE 8.0 09/09/2016 1521    GLUCOSEU 50 (A) 09/09/2016 1521   GLUCOSEU 50 mg/dL 09/08/2013 0133   HGBUR NEGATIVE 09/09/2016 1521   BILIRUBINUR NEGATIVE 09/09/2016 1521   BILIRUBINUR Negative 09/08/2013 0133   KETONESUR NEGATIVE 09/09/2016  [place] (A) 09/09/2016 1521   NITRITE NEGATIVE 09/09/2016 1521   LEUKOCYTESUR NEGATIVE 09/09/2016 1521   LEUKOCYTESUR Negative 09/08/2013 0133    Radiological Exams on Admission: DG Chest Port 1 View  Result Date: 07/26/2021 CLINICAL DATA:  Shortness of breath EXAM: PORTABLE CHEST 1 VIEW COMPARISON:  07/08/2021 FINDINGS: Increased interstitial markings, chronic, although mildly progressive, particularly in the right lower lobe. This appearance favors atypical infection over interstitial edema. No definite pleural effusions. No pneumothorax. The heart is top-normal in size.  Thoracic aortic atherosclerosis. Left IJ dual lumen dialysis catheter. IMPRESSION: Increased interstitial markings, mildly progressive, favoring atypical infection over interstitial edema. Electronically Signed   By: Julian Hy M.D.   On: 07/26/2021 22:17     Data Reviewed: Relevant notes from primary care and specialist visits, past discharge summaries as available in EHR, including Care Everywhere. Prior diagnostic testing as pertinent to current admission diagnoses Updated medications and problem lists for reconciliation ED course, including vitals, labs, imaging, treatment and response to treatment Triage notes, nursing and pharmacy notes and ED provider's notes Notable results as noted in HPI   Assessment and Plan: Acute on chronic systolic CHF (congestive heart failure) (HCC) Hypertensive emergency BP as high as 190/128 and patient symptomatic for chest tightness, shortness of breath and headache Nitropaste and hydralazine as needed for blood pressure control Continue home metoprolol and Entresto Daily weights with intake and output monitoring Expecting further improvement with  dialysis in the a.m.  Atypical pneumonia Patient has a dry cough and chills cxr shows possible atypical pneumonia azithromycin and antitussives ordered  Acute respiratory failure with hypoxia (HCC) No history of home oxygen use Patient hypoxic to 88% on room air, tachypneic and short of breath Chest x-ray showing increased interstitial markings, mildly progressive tapering atypical infection of interstitial edema Continue supplemental oxygen and wean as tolerated Treat acute exacerbation of CHF Low suspicion for respiratory tract infection at this time but will continue to monitor  ESRD on dialysis [facility]) Nephrology consult for continuation of dialysis  Anemia in ESRD (end-stage renal disease) (HCC) Hemoglobin at baseline  Meningioma, cerebral (HCC) Chronic headaches Headache at baseline  Paroxysmal A-fib (HCC) Continue Coumadin and metoprolol  Seizures (HCC) Not currently on antiepileptic drugs        DVT prophylaxis: Heparin  Consults: Nephrology  Advance Care Planning:   Code Status: Prior   Family Communication: none  Disposition Plan: Back to previous home environment  Severity of Illness: The appropriate patient status for this patient is INPATIENT. Inpatient status is judged to be reasonable and necessary in order to provide the required intensity of service to ensure the patient's safety. The patient's presenting symptoms, physical exam findings, and initial radiographic and laboratory data in the context of their chronic comorbidities is felt to place them at high risk for further clinical deterioration. Furthermore, it is not anticipated that the patient will be medically stable for discharge from the hospital within 2 midnights of admission.   * I certify that at the point of admission it is my clinical judgment that the patient will require inpatient hospital care spanning beyond 2 midnights from the point of admission due to high intensity of service, high  risk for further deterioration and high frequency of surveillance required.*  Author: Athena Masse, MD 07/27/2021 12:01 AM  For on call review [email or web address].

## 2021-07-27 NOTE — ED Notes (Signed)
This RN to bedside for pt. Assessment and medication admin. Pt. Is sleeping in bed, NAD, eyes closed, chest rise and fall.

## 2021-07-27 NOTE — Assessment & Plan Note (Addendum)
A blood pressure reading of 190/128  in the emergency room.  Continue Imdur, Entresto.  Added hydralazine.  Toprol switched over to Coreg.

## 2021-07-27 NOTE — Progress Notes (Signed)
Pt HD end at [place]. 3 hr tx complete, 528m fluid removed and 54L BVP. Pt alert and stable, family present, report to primary RN. Catheter limbs locked with 1.717mheparin each. Pt safety maintained.

## 2021-07-28 DIAGNOSIS — I5023 Acute on chronic systolic (congestive) heart failure: Secondary | ICD-10-CM | POA: Diagnosis not present

## 2021-07-28 DIAGNOSIS — J9601 Acute respiratory failure with hypoxia: Secondary | ICD-10-CM | POA: Diagnosis not present

## 2021-07-28 DIAGNOSIS — N186 End stage renal disease: Secondary | ICD-10-CM | POA: Diagnosis not present

## 2021-07-28 DIAGNOSIS — I161 Hypertensive emergency: Secondary | ICD-10-CM | POA: Diagnosis not present

## 2021-07-28 LAB — CBC
HCT: 24.9 % — ABNORMAL LOW (ref 36.0–46.0)
Hemoglobin: 8.2 g/dL — ABNORMAL LOW (ref 12.0–15.0)
MCH: 27.8 pg (ref 26.0–34.0)
MCHC: 32.9 g/dL (ref 30.0–36.0)
MCV: 84.4 fL (ref 80.0–100.0)
Platelets: 158 10*3/uL (ref 150–400)
RBC: 2.95 MIL/uL — ABNORMAL LOW (ref 3.87–5.11)
RDW: 17.4 % — ABNORMAL HIGH (ref 11.5–15.5)
WBC: 7.4 10*3/uL (ref 4.0–10.5)
nRBC: 0 % (ref 0.0–0.2)

## 2021-07-28 LAB — RENAL FUNCTION PANEL
Albumin: 3 g/dL — ABNORMAL LOW (ref 3.5–5.0)
Anion gap: 7 (ref 5–15)
BUN: 18 mg/dL (ref 8–23)
CO2: 28 mmol/L (ref 22–32)
Calcium: 9.4 mg/dL (ref 8.9–10.3)
Chloride: 102 mmol/L (ref 98–111)
Creatinine, Ser: 5.05 mg/dL — ABNORMAL HIGH (ref 0.44–1.00)
GFR, Estimated: 9 mL/min — ABNORMAL LOW (ref >60)
Glucose, Bld: 90 mg/dL (ref 70–99)
Phosphorus: 4.1 mg/dL (ref 2.5–4.6)
Potassium: 3.6 mmol/L (ref 3.5–5.1)
Sodium: 137 mmol/L (ref 135–145)

## 2021-07-28 LAB — HEPATITIS B SURFACE ANTIBODY, QUANTITATIVE: Hep B S AB Quant (Post): 8.9 m[IU]/mL — ABNORMAL LOW (ref >9.9)

## 2021-07-28 LAB — PROTIME-INR
INR: 2.8 — ABNORMAL HIGH (ref 0.8–1.2)
Prothrombin Time: 29.4 seconds — ABNORMAL HIGH (ref 11.4–15.2)

## 2021-07-28 LAB — MAGNESIUM: Magnesium: 1.8 mg/dL (ref 1.7–2.4)

## 2021-07-28 MED ORDER — ZOLPIDEM TARTRATE 5 MG PO TABS
5.0000 mg | ORAL_TABLET | Freq: Every evening | ORAL | Status: DC | PRN
  Administered 2021-07-28 – 2021-07-29 (×2): 5 mg via ORAL
  Filled 2021-07-28 (×2): qty 1

## 2021-07-28 MED ORDER — HEPARIN SODIUM (PORCINE) 1000 UNIT/ML IJ SOLN
INTRAMUSCULAR | Status: AC
  Filled 2021-07-28: qty 10

## 2021-07-28 MED ORDER — WARFARIN SODIUM 4 MG PO TABS
4.0000 mg | ORAL_TABLET | Freq: Once | ORAL | Status: AC
  Administered 2021-07-28: 4 mg via ORAL
  Filled 2021-07-28: qty 1

## 2021-07-28 NOTE — Progress Notes (Signed)
ANTICOAGULATION CONSULT NOTE - Initial Consult  Pharmacy Consult for warfarin Indication: atrial fibrillation, hx DVT  Allergies  Allergen Reactions   Gabapentin Other (See Comments)    Seizure   Adhesive [Tape] Itching    Silk tape is ok to use.    Patient Measurements: Height: '5\' 5"'$  (165.1 cm) Weight: 53.8 kg (118 lb 9.7 oz) IBW/kg (Calculated) : 57 Heparin Dosing Weight:    Vital Signs: Temp: 99.5 F (37.5 C) (07/22 0919) Temp Source: Axillary (07/22 0919) BP: 128/86 (07/22 0930) Pulse Rate: 54 (07/22 0930)  Labs: Recent Labs    07/26/21 2218 07/27/21 0011 07/27/21 0454 07/27/21 1312 07/27/21 1635 07/28/21 0514  HGB 9.6*  --  8.6*  --   --  8.2*  HCT 29.9*  --  27.1*  --   --  24.9*  PLT 126*  --  161  --   --  158  LABPROT 27.6*  --  29.6*  --   --  29.4*  INR 2.6*  --  2.8*  --   --  2.8*  CREATININE 8.32*  --   --   --   --  5.05*  TROPONINIHS 28* 30*  --  37* 52*  --      Estimated Creatinine Clearance: 9.2 mL/min (A) (by C-G formula based on SCr of 5.05 mg/dL (H)).   Medical History: Past Medical History:  Diagnosis Date   Anemia    Anxiety    Breast cancer ([place]) 01/2016   bilateral   CHF (congestive heart failure) (HCC)    Chronic kidney disease    Depression    Dialysis patient [facility])    DVT (deep venous thrombosis) (HCC)    left leg   DVT (deep venous thrombosis) ([place]) 1985   right thigh   Dysrhythmia    Gout    Headache    HTN (hypertension)    Hypertension    Parathyroid abnormality (HCC)    Parathyroid disease ([place])    Pneumonia 12/2015   Psoriasis    Renal insufficiency    Sickle cell trait (HCC)    traits    Medications:  Scheduled:   azithromycin  500 mg Oral Daily   calcium carbonate  2 tablet Oral Once   Chlorhexidine Gluconate Cloth  6 each Topical Q0600   cinacalcet  60 mg Oral QAC lunch   isosorbide mononitrate  30 mg Oral Daily   melatonin  5 mg Oral QHS   methylPREDNISolone (SOLU-MEDROL) injection  40 mg  Intravenous Daily   metoprolol succinate  50 mg Oral Daily   pantoprazole  40 mg Oral Daily   sacubitril-valsartan  1 tablet Oral BID   sertraline  150 mg Oral Daily   sevelamer carbonate  2,400 mg Oral TID with meals   traZODone  50 mg Oral QHS   Warfarin - Pharmacist Dosing Inpatient   Does not apply q1600   Infusions:   anticoagulant sodium citrate      Assessment: 66 yo F to continue warfarin therapy for Afib Home regimen: 5 mg M,W,F,Sa,Su and 7.5 mg T,TH Hgb 9.6  Plt 126  7/20 INR 2.6 - (PTA VKA 7.'5mg'$  '@1230'$ ) 7/21 INR 2.8 - VKA '4mg'$   7/22 INR 2.8   DDI: azithromycin (7/21-24) Mild increase of INR - APAP ~2g/d, PPI, solumedrol.  Goal of Therapy:  INR 2-3 Monitor platelets by anticoagulation protocol: Yes   Plan:  INR stable within goal 2.8>2.8.  Plan to repeat Warfarin '4mg'$  PO x1 (slightly dec'd  from home dose d/t start of azithromycin, solumedrol, PPI, & req'ing ~2g APAP/24h.) F/u INR and CBC per protocol  Shanon Brow Gottfried Standish 07/28/2021,9:58 AM

## 2021-07-28 NOTE — Progress Notes (Signed)
  Progress Note   Patient: Teresa Cook ZOX:[id number] DOB: 09/16/1955 DOA: 07/26/2021     2 DOS: the patient was seen and examined on 07/28/2021     Assessment and Plan: * Acute respiratory failure with hypoxia [facility]) Patient hypoxic to 88% on room air, tachypneic and and short of breath requiring transfer to the ICU on 07/27/2021 for BiPAP.  She required urgent dialysis.  This morning on 6 L high flow nasal cannula.  Breathing better after dialysis hopefully be able to taper off oxygen completely prior to disposition  Acute on chronic systolic CHF (congestive heart failure) (HCC) Echocardiogram ordered.  Dialysis to manage fluid.  Patient cardiology consultation.  Hypertensive emergency A blood pressure reading of 190/128  in the emergency room.  Continue Imdur, Toprol, Entresto  ESRD on dialysis [facility]) Patient had an extra dialysis session today.  Atypical pneumonia Viral respiratory panel negative.  Discontinue Zithromax and Solu-Medrol.  This is likely secondary to fluid overload.  Anemia in ESRD (end-stage renal disease) (HCC) Last hemoglobin 8.2.  Meningioma, cerebral ([place]) With history of chronic headaches  Paroxysmal A-fib (HCC) Continue Coumadin and metoprolol  Seizures (HCC) Not currently on antiepileptic drugs        Subjective: Patient feeling better today.  Seen after her second dialysis session.  Breathing much better.  Patient states the fluid buildup is quicker than previous.  She does not urinate.  Physical Exam: Vitals:   07/28/21 1130 07/28/21 1200 07/28/21 1237 07/28/21 1247  BP: (!) 152/97 138/89 (!) 150/92 (!) 150/92  Pulse: 74 73    Resp: 19 15    Temp:    98.3 F (36.8 C)  TempSrc:      SpO2: 100% 100%    Weight:   52 kg   Height:       Physical Exam HENT:     Head: Normocephalic.     Mouth/Throat:     Pharynx: No oropharyngeal exudate.  Eyes:     General: Lids are normal.     Conjunctiva/sclera: Conjunctivae normal.   Cardiovascular:     Rate and Rhythm: Normal rate and regular rhythm.     Heart sounds: Normal heart sounds, S1 normal and S2 normal.  Pulmonary:     Effort: No accessory muscle usage.     Breath sounds: Examination of the right-lower field reveals decreased breath sounds. Examination of the left-lower field reveals decreased breath sounds. Decreased breath sounds present. No rales.  Abdominal:     Palpations: Abdomen is soft.     Tenderness: There is no abdominal tenderness.  Musculoskeletal:     Right lower leg: Swelling present.     Left lower leg: Swelling present.  Skin:    General: Skin is warm.     Findings: No rash.  Neurological:     Mental Status: She is alert and oriented to person, place, and time.     Data Reviewed: Labs creatinine 5.05, last hemoglobin 8.2  Family Communication: Spoke with family at the bedside  Disposition: Status is: Inpatient Remains inpatient appropriate because: Patient stable for transfer out of the ICU.  Still on high flow nasal cannula.  Hopefully can start to taper this off  Planned Discharge Destination: Home    Time spent: 28 minutes  Author: Loletha Grayer, MD 07/28/2021 2:19 PM  For on call review [email or web address].

## 2021-07-28 NOTE — Progress Notes (Addendum)
[facility]  ROUNDING NOTE   Subjective:   Teresa Cook 66 year old African-American female with past medical conditions including hypertension, gout, anxiety with depression, sickle cell trait, systolic heart failure with a EF 40 to 45%, breast cancer with bilateral mastectomy, seizures, and end-stage renal disease on hemodialysis.  Patient presents to the emergency department with complaints of shortness of breath.  Patient has been admitted for Acute respiratory failure with hypoxia ([place]) [J96.01] Troponin I above reference range [R77.8] Fluid overload [E87.70] Hypervolemia, unspecified hypervolemia type [E87.70]  Patient is known to our practice and receives outpatient dialysis treatments at [facility] on a MWF schedule, supervised by Dr. Candiss Norse.    Update:  Patient seen and evaluated at bedside in ICU Resting comfortably Weaned off Bipap, currently on 10L [place] Complains of abdominal soreness  Objective:  Vital signs in last 24 hours:  Temp:  [97.8 F (36.6 C)-99.3 F (37.4 C)] 99.3 F (37.4 C) (07/22 0200) Pulse Rate:  [45-123] 80 (07/22 0600) Resp:  [8-40] 8 (07/22 0600) BP: (85-223)/(57-156) 109/69 (07/22 0600) SpO2:  [65 %-100 %] 100 % (07/22 0600) Weight:  [54.8 kg-56.9 kg] 56.9 kg (07/22 0102)  Weight change: -2.353 kg Filed Weights   07/27/21 1206 07/27/21 1615 07/28/21 0102  Weight: 54.8 kg 54.8 kg 56.9 kg    Intake/Output: I/O last 3 completed shifts: In: -  Out: 500 [Other:500]   Intake/Output this shift:  No intake/output data recorded.  Physical Exam: General: Ill-appearing  Head: Normocephalic, atraumatic. Moist oral mucosal membranes  Eyes: Anicteric  Lungs:  Right-sided wheeze, normal effort, 10 L [place]  Heart: Regular rate and rhythm  Abdomen:  Soft, mild tenderness, distended  Extremities: No peripheral edema.  Neurologic: Nonfocal, moving all four extremities  Skin: No lesions  Access: Left PermCath    Basic  Metabolic Panel: Recent Labs  Lab 07/26/21 2218 07/28/21 0514  NA 142 137  K 3.6 3.6  CL 106 102  CO2 27 28  GLUCOSE 102* 90  BUN 36* 18  CREATININE 8.32* 5.05*  CALCIUM 10.5* 9.4  MG  --  1.8  PHOS  --  4.1     Liver Function Tests: Recent Labs  Lab 07/28/21 0514  ALBUMIN 3.0*   No results for input(s): "LIPASE", "AMYLASE" in the last 168 hours. No results for input(s): "AMMONIA" in the last 168 hours.  CBC: Recent Labs  Lab 07/26/21 2218 07/27/21 0454 07/28/21 0514  WBC 6.9 6.5 7.4  NEUTROABS 5.2  --   --   HGB 9.6* 8.6* 8.2*  HCT 29.9* 27.1* 24.9*  MCV 86.7 86.9 84.4  PLT 126* 161 158     Cardiac Enzymes: No results for input(s): "CKTOTAL", "CKMB", "CKMBINDEX", "TROPONINI" in the last 168 hours.  BNP: Invalid input(s): "POCBNP"  CBG: Recent Labs  Lab 07/27/21 1207  [place]*     Microbiology: Results for orders placed or performed during the hospital encounter of 07/26/21  SARS Coronavirus 2 by RT PCR (hospital order, performed in [facility] hospital lab) *cepheid single result test* Anterior Nasal Swab     Status: None   Collection Time: 07/26/21 10:36 PM   Specimen: Anterior Nasal Swab  Result Value Ref Range Status   SARS Coronavirus 2 by RT PCR NEGATIVE NEGATIVE Final    Comment: (NOTE) SARS-CoV-2 target nucleic acids are NOT DETECTED.  The SARS-CoV-2 RNA is generally detectable in upper and lower respiratory specimens during the acute phase of infection. The lowest concentration of SARS-CoV-2 viral copies  this assay can detect is 250 copies / mL. A negative result does not preclude SARS-CoV-2 infection and should not be used as the sole basis for treatment or other patient management decisions.  A negative result may occur with improper specimen collection / handling, submission of specimen other than nasopharyngeal swab, presence of viral mutation(s) within the areas targeted by this assay, and inadequate number of viral  copies (<250 copies / mL). A negative result must be combined with clinical observations, patient history, and epidemiological information.  Fact Sheet for Patients:   https://www.patel.info/  Fact Sheet for Healthcare Providers: https://hall.com/  This test is not yet approved or  cleared by the [place] FDA and has been authorized for detection and/or diagnosis of SARS-CoV-2 by FDA under an Emergency Use Authorization (EUA).  This EUA will remain in effect (meaning this test can be used) for the duration of the COVID-19 declaration under Section 564(b)(1) of the Act, 21 U.S.C. section 360bbb-3(b)(1), unless the authorization is terminated or revoked sooner.  Performed at [facility], [place], [place] [address]   Respiratory (~20 pathogens) panel by PCR     Status: None   Collection Time: 07/27/21  8:42 AM   Specimen: Nasopharyngeal Swab; Respiratory  Result Value Ref Range Status   Adenovirus NOT DETECTED NOT DETECTED Final   Coronavirus 229E NOT DETECTED NOT DETECTED Final    Comment: (NOTE) The Coronavirus on the Respiratory Panel, DOES NOT test for the novel  Coronavirus 2017-04-10 nCoV)    Coronavirus HKU1 NOT DETECTED NOT DETECTED Final   Coronavirus NL63 NOT DETECTED NOT DETECTED Final   Coronavirus OC43 NOT DETECTED NOT DETECTED Final   Metapneumovirus NOT DETECTED NOT DETECTED Final   Rhinovirus / Enterovirus NOT DETECTED NOT DETECTED Final   Influenza A NOT DETECTED NOT DETECTED Final   Influenza B NOT DETECTED NOT DETECTED Final   Parainfluenza Virus 1 NOT DETECTED NOT DETECTED Final   Parainfluenza Virus 2 NOT DETECTED NOT DETECTED Final   Parainfluenza Virus 3 NOT DETECTED NOT DETECTED Final   Parainfluenza Virus 4 NOT DETECTED NOT DETECTED Final   Respiratory Syncytial Virus NOT DETECTED NOT DETECTED Final   Bordetella pertussis NOT DETECTED NOT DETECTED Final   Bordetella Parapertussis NOT  DETECTED NOT DETECTED Final   Chlamydophila pneumoniae NOT DETECTED NOT DETECTED Final   Mycoplasma pneumoniae NOT DETECTED NOT DETECTED Final    Comment: Performed at [facility], [place]. [address], [place], [place] [address]    Coagulation Studies: Recent Labs    07/26/21 2216-04-10 07/27/21 0454 07/28/21 0514  LABPROT 27.6* 29.6* 29.4*  INR 2.6* 2.8* 2.8*     Urinalysis: No results for input(s): "COLORURINE", "LABSPEC", "PHURINE", "GLUCOSEU", "HGBUR", "BILIRUBINUR", "KETONESUR", "PROTEINUR", "UROBILINOGEN", "NITRITE", "LEUKOCYTESUR" in the last 72 hours.  Invalid input(s): "APPERANCEUR"    Imaging: DG Chest Port 1 View  Result Date: 07/27/2021 CLINICAL DATA:  Chest pain and shortness of breath EXAM: PORTABLE CHEST 1 VIEW COMPARISON:  Chest radiograph 1 day prior FINDINGS: The left-sided vascular catheter is stable terminating in the right atrium. The cardiomediastinal silhouette is stable. Extensive opacities are seen throughout the right lung which have significantly worsened since the study from 1 day prior. Aeration of the left lung is not significantly changed. There is no significant pleural effusion. There is no pneumothorax. The bones are stable. IMPRESSION: New extensive opacities throughout the right lung may reflect new asymmetric alveolar edema, infection, or combination thereof. Electronically Signed   By: Collier Salina  Noone M.D.   On: 07/27/2021 12:06   DG Chest Port 1 View  Result Date: 07/26/2021 CLINICAL DATA:  Shortness of breath EXAM: PORTABLE CHEST 1 VIEW COMPARISON:  07/08/2021 FINDINGS: Increased interstitial markings, chronic, although mildly progressive, particularly in the right lower lobe. This appearance favors atypical infection over interstitial edema. No definite pleural effusions. No pneumothorax. The heart is top-normal in size.  Thoracic aortic atherosclerosis. Left IJ dual lumen dialysis catheter. IMPRESSION: Increased interstitial markings, mildly  progressive, favoring atypical infection over interstitial edema. Electronically Signed   By: Julian Hy M.D.   On: 07/26/2021 22:17     Medications:    anticoagulant sodium citrate      azithromycin  500 mg Oral Daily   calcium carbonate  2 tablet Oral Once   Chlorhexidine Gluconate Cloth  6 each Topical Q0600   cinacalcet  60 mg Oral QAC lunch   isosorbide mononitrate  30 mg Oral Daily   melatonin  5 mg Oral QHS   methylPREDNISolone (SOLU-MEDROL) injection  40 mg Intravenous Daily   metoprolol succinate  50 mg Oral Daily   pantoprazole  40 mg Oral Daily   sacubitril-valsartan  1 tablet Oral BID   sertraline  150 mg Oral Daily   sevelamer carbonate  2,400 mg Oral TID with meals   traZODone  50 mg Oral QHS   Warfarin - Pharmacist Dosing Inpatient   Does not apply q1600   acetaminophen **OR** acetaminophen, acetaminophen-caffeine, albuterol, ALPRAZolam, alteplase, anticoagulant sodium citrate, guaiFENesin-dextromethorphan, heparin, hydrALAZINE, lidocaine (PF), lidocaine-prilocaine, nitroGLYCERIN, ondansetron **OR** ondansetron (ZOFRAN) IV, pentafluoroprop-tetrafluoroeth, sevelamer carbonate  Assessment/ Plan:  Ms. Najmah Carradine is a 66 y.o.  female with past medical conditions including hypertension, gout, anxiety with depression, sickle cell trait, systolic heart failure with a EF 40 to 45%, breast cancer with bilateral mastectomy, seizures, and end-stage renal disease on hemodialysis.  Patient presents to the emergency department with complaints of shortness of breath.  Patient has been admitted for  [facility]/MWF/Lt Permcath  End-stage renal disease on hemodialysis.  Will maintain outpatient schedule if possible.  Hepatitis B panel ordered as hospital protocol dictates.  Dialysis treatment terminated after 37 minutes yesterday due to increased respiratory distress, hypertension, and anxiety.  Patient placed on BiPAP and transition to ICU.  Once patient condition  improved, completed dialysis treatment at the bedside.  UF goal 500 mL achieved.  We will attempt UF only treatment today, UF goal 1.5 to 2 L as tolerated.  Next treatment scheduled for Monday.  2.  Acute respiratory failure with hypoxia.  Chest x-ray ordered shows new extensive opacities throughout the right lung concerning for alveolar edema, infection or combination of both.    Patient weaned off BiPAP currently on nasal cannula.  Currently receiving a Zithromax.  3.  Acute on chronic systolic heart failure.  Echo completed on 01/23/2020 shows EF 40 to 45% with a moderate LVH.  Patient has had multiple emergency department visits for shortness of breath and pulmonary edema.   Cardiology consulted and awaiting ordered echo.  4. Anemia of chronic kidney disease Lab Results  Component Value Date   HGB 8.2 (L) 07/28/2021   Patient receives [place] outpatient.  We will monitor  5. Secondary Hyperparathyroidism: Lab Results  Component Value Date   PTH 214 (H) 07/08/2021   CALCIUM 9.4 07/28/2021   PHOS 4.1 07/28/2021    Calcium and phosphorus within acceptable range.  Continue calcium carbonate and sevelamer.    LOS: [place] 7/22/20237:23  AM

## 2021-07-28 NOTE — Progress Notes (Signed)
Report given to 2A RN receiving patient, room 253. Patient being transported via bed to 2A.

## 2021-07-28 NOTE — Progress Notes (Signed)
Received patient in bed, alert and oriented. Informed consent signed and in chart.  Time tx completed: 1237  HD treatment completed.  Patient tolerated well.  HD catheter without signs and symptoms of complications.  Patient transported back to the room, alert and orient and in no acute distress.   Report given to bedside RN. Saundra Shelling  Total UF removed: 1.8L  Medication given: none  Post HD VS: bp 150/92   Temp 98.3   Rr 18   Hr 79  Post HD weight:  52 kg

## 2021-07-29 ENCOUNTER — Inpatient Hospital Stay (HOSPITAL_COMMUNITY)
Admit: 2021-07-29 | Discharge: 2021-07-29 | Disposition: A | Discharge status: Home / Self Care | Payer: Medicare Other | Attending: Internal Medicine | Admitting: Internal Medicine

## 2021-07-29 DIAGNOSIS — K219 Gastro-esophageal reflux disease without esophagitis: Secondary | ICD-10-CM

## 2021-07-29 DIAGNOSIS — I161 Hypertensive emergency: Secondary | ICD-10-CM | POA: Diagnosis not present

## 2021-07-29 DIAGNOSIS — N186 End stage renal disease: Secondary | ICD-10-CM | POA: Diagnosis not present

## 2021-07-29 DIAGNOSIS — I5021 Acute systolic (congestive) heart failure: Secondary | ICD-10-CM | POA: Diagnosis not present

## 2021-07-29 DIAGNOSIS — J9601 Acute respiratory failure with hypoxia: Secondary | ICD-10-CM | POA: Diagnosis not present

## 2021-07-29 DIAGNOSIS — I428 Other cardiomyopathies: Secondary | ICD-10-CM

## 2021-07-29 DIAGNOSIS — I5023 Acute on chronic systolic (congestive) heart failure: Secondary | ICD-10-CM | POA: Diagnosis not present

## 2021-07-29 LAB — PROTIME-INR
INR: 2.9 — ABNORMAL HIGH (ref 0.8–1.2)
Prothrombin Time: 30 seconds — ABNORMAL HIGH (ref 11.4–15.2)

## 2021-07-29 MED ORDER — ATORVASTATIN CALCIUM 20 MG PO TABS
40.0000 mg | ORAL_TABLET | Freq: Every day | ORAL | Status: DC
Start: 2021-07-29 — End: 2021-07-30
  Filled 2021-07-29: qty 2

## 2021-07-29 MED ORDER — METOPROLOL SUCCINATE ER 50 MG PO TB24
50.0000 mg | ORAL_TABLET | Freq: Once | ORAL | Status: AC
  Administered 2021-07-29: 50 mg via ORAL
  Filled 2021-07-29: qty 1

## 2021-07-29 MED ORDER — WARFARIN SODIUM 3 MG PO TABS
3.0000 mg | ORAL_TABLET | Freq: Once | ORAL | Status: AC
  Administered 2021-07-29: 3 mg via ORAL
  Filled 2021-07-29: qty 1

## 2021-07-29 MED ORDER — CALCIUM CARBONATE ANTACID 500 MG PO CHEW
1.0000 | CHEWABLE_TABLET | Freq: Two times a day (BID) | ORAL | Status: DC
  Administered 2021-07-29 (×2): 200 mg via ORAL
  Filled 2021-07-29 (×2): qty 1

## 2021-07-29 MED ORDER — METOPROLOL SUCCINATE ER 100 MG PO TB24
100.0000 mg | ORAL_TABLET | Freq: Every day | ORAL | Status: DC
  Administered 2021-07-30: 100 mg via ORAL
  Filled 2021-07-29: qty 1

## 2021-07-29 MED ORDER — HYDRALAZINE HCL 25 MG PO TABS
25.0000 mg | ORAL_TABLET | Freq: Three times a day (TID) | ORAL | Status: DC
  Administered 2021-07-29 – 2021-07-30 (×4): 25 mg via ORAL
  Filled 2021-07-29 (×4): qty 1

## 2021-07-29 MED ORDER — METOPROLOL SUCCINATE ER 100 MG PO TB24: 100.0000 mg | ORAL_TABLET | Freq: Every day | ORAL | Status: DC

## 2021-07-29 MED ORDER — PANTOPRAZOLE SODIUM 40 MG PO TBEC
40.0000 mg | DELAYED_RELEASE_TABLET | Freq: Two times a day (BID) | ORAL | Status: DC
  Administered 2021-07-29 – 2021-07-30 (×2): 40 mg via ORAL
  Filled 2021-07-29 (×2): qty 1

## 2021-07-29 MED ORDER — ROSUVASTATIN CALCIUM 10 MG PO TABS
20.0000 mg | ORAL_TABLET | Freq: Every day | ORAL | Status: DC
  Filled 2021-07-29: qty 2

## 2021-07-29 MED ORDER — ASPIRIN 81 MG PO TBEC
81.0000 mg | DELAYED_RELEASE_TABLET | Freq: Every day | ORAL | Status: DC
  Filled 2021-07-29 (×2): qty 1

## 2021-07-29 NOTE — Progress Notes (Signed)
Progress Note   Patient: Teresa Cook ZOX:[id number] DOB: [birth year] DOA: 07/26/2021     3 DOS: the patient was seen and examined on 07/29/2021     Assessment and Plan: * Acute respiratory failure with hypoxia [facility]) Patient hypoxic to 88% on room air, tachypneic and and short of breath requiring transfer to the ICU on 07/27/2021 for BiPAP.  She required urgent dialysis.  This morning patient was on high flow nasal cannula 4 L and was able to come off oxygen completely.  We will check an overnight oximetry to see if hypoxia at night driving CHF.  Acute on chronic systolic CHF (congestive heart failure) (HCC) Echocardiogram ordered.  Dialysis to manage fluid.  The patient does not urinate so dialysis is the only way to manage fluid.  Increase Toprol-XL to 100 mg daily.  Patient also on Entresto.  Add hydralazine today.  Continue Imdur.  Hypertensive emergency A blood pressure reading of 190/128  in the emergency room.  Continue Imdur, Toprol, Entresto.  Blood pressure this morning good at 129/79.  Blood pressure late morning 138/102.  Add hydralazine.  Check serum metanephrines tomorrow.  ESRD on dialysis [facility]) Patient had an extra dialysis session yesterday.  Continue on routine schedule now.  Atypical pneumonia Ruled out.  Viral respiratory panel negative.  This is likely secondary to fluid overload.  Anemia in ESRD (end-stage renal disease) (HCC) Last hemoglobin 8.2.  Meningioma, cerebral ([place]) With history of chronic headaches  GERD (gastroesophageal reflux disease) Increase PPI to twice daily dosing  Paroxysmal A-fib (HCC) Continue Coumadin and metoprolol  Seizures (HCC) Not currently on antiepileptic drugs        Subjective: Patient told me that sometimes she feels her heart rate go up fast while she is just sitting there.  Patient able to come off oxygen today.  Patient complaining of acid reflux.  Physical Exam: Vitals:   07/29/21 0742 07/29/21 1125 07/29/21 1135  07/29/21 1145  BP: 129/79 (!) 138/102    Pulse: 76 85    Resp: 18 18    Temp: 98.1 F (36.7 C)     TempSrc:      SpO2: 100% 98% 97% 95%  Weight:      Height:       Physical Exam HENT:     Head: Normocephalic.     Mouth/Throat:     Pharynx: No oropharyngeal exudate.  Eyes:     General: Lids are normal.     Conjunctiva/sclera: Conjunctivae normal.  Cardiovascular:     Rate and Rhythm: Normal rate and regular rhythm.     Heart sounds: Normal heart sounds, S1 normal and S2 normal.  Pulmonary:     Effort: No accessory muscle usage.     Breath sounds: Examination of the right-lower field reveals decreased breath sounds. Examination of the left-lower field reveals decreased breath sounds. Decreased breath sounds present. No rales.  Abdominal:     Palpations: Abdomen is soft.     Tenderness: There is no abdominal tenderness.  Musculoskeletal:     Right lower leg: Swelling present.     Left lower leg: Swelling present.  Skin:    General: Skin is warm.     Findings: No rash.  Neurological:     Mental Status: She is alert and oriented to person, place, and time.     Data Reviewed: INR 2.9, last hemoglobin 8.2  Family Communication: Updated patient's daughter on the phone  Disposition: Status is: Inpatient Remains inpatient appropriate because: Just came  off oxygen today.  Titrating up blood pressure medications  Planned Discharge Destination: Home    Time spent: 28 minutes Case discussed with nephrology and cardiology  Author: Loletha Grayer, MD 07/29/2021 2:59 PM  For on call review [email or web address].

## 2021-07-29 NOTE — Progress Notes (Signed)
SATURATION QUALIFICATIONS: (This note is used to comply with regulatory documentation for home oxygen)  Patient Saturations on Room Air at Rest = 97%  Patient Saturations on Room Air while Ambulating = 95%  Patient Saturations on n/a Liters of oxygen while Ambulating = n/a%  Please briefly explain why patient needs home oxygen:

## 2021-07-29 NOTE — Progress Notes (Signed)
*  PRELIMINARY RESULTS* Echocardiogram 2D Echocardiogram has been performed.  Teresa Cook 07/29/2021, 6:57 PM

## 2021-07-29 NOTE — Progress Notes (Signed)
ANTICOAGULATION CONSULT NOTE - Initial Consult  Pharmacy Consult for warfarin Indication: atrial fibrillation, hx DVT  Allergies  Allergen Reactions   Gabapentin Other (See Comments)    Seizure   Adhesive [Tape] Itching    Silk tape is ok to use.    Patient Measurements: Height: '5\' 5"'$  (165.1 cm) Weight: 55.2 kg (121 lb 11.1 oz) IBW/kg (Calculated) : 57 Heparin Dosing Weight:    Vital Signs: Temp: 98.1 F (36.7 C) (07/23 0742) BP: 138/102 (07/23 1125) Pulse Rate: 85 (07/23 1125)  Labs: Recent Labs    07/26/21 2218 07/27/21 0011 07/27/21 0454 07/27/21 1312 07/27/21 1635 07/28/21 0514 07/29/21 0643  HGB 9.6*  --  8.6*  --   --  8.2*  --   HCT 29.9*  --  27.1*  --   --  24.9*  --   PLT 126*  --  161  --   --  158  --   LABPROT 27.6*  --  29.6*  --   --  29.4* 30.0*  INR 2.6*  --  2.8*  --   --  2.8* 2.9*  CREATININE 8.32*  --   --   --   --  5.05*  --   TROPONINIHS 28* 30*  --  37* 52*  --   --      Estimated Creatinine Clearance: 9.4 mL/min (A) (by C-G formula based on SCr of 5.05 mg/dL (H)).   Medical History: Past Medical History:  Diagnosis Date   Anemia    Anxiety    Breast cancer ([place]) 01/2016   bilateral   CHF (congestive heart failure) (HCC)    Chronic kidney disease    Depression    Dialysis patient [facility])    DVT (deep venous thrombosis) (HCC)    left leg   DVT (deep venous thrombosis) ([place]) 1985   right thigh   Dysrhythmia    Gout    Headache    HTN (hypertension)    Hypertension    Parathyroid abnormality (HCC)    Parathyroid disease ([place])    Pneumonia 12/2015   Psoriasis    Renal insufficiency    Sickle cell trait (HCC)    traits    Medications:  Scheduled:   aspirin EC  81 mg Oral Daily   atorvastatin  40 mg Oral Daily   calcium carbonate  1 tablet Oral BID   calcium carbonate  2 tablet Oral Once   Chlorhexidine Gluconate Cloth  6 each Topical Q0600   cinacalcet  60 mg Oral QAC lunch   isosorbide mononitrate  30 mg Oral  Daily   melatonin  5 mg Oral QHS   [START ON 07/30/2021] metoprolol succinate  100 mg Oral Daily   pantoprazole  40 mg Oral BID   sacubitril-valsartan  1 tablet Oral BID   sertraline  150 mg Oral Daily   sevelamer carbonate  2,400 mg Oral TID with meals   traZODone  50 mg Oral QHS   Warfarin - Pharmacist Dosing Inpatient   Does not apply q1600   Infusions:    Assessment: 66 yo F to continue warfarin therapy for Afib Home regimen: 5 mg M,W,F,Sa,Su and 7.5 mg T,TH Hgb 9.6  Plt 126  7/20 INR 2.6 - (PTA VKA 7.'5mg'$  '@1230'$ ) 7/21 INR 2.8 - VKA '4mg'$   7/22 INR 2.8 - VKA '4mg'$  7/23 INR 2.9 - VKA '3mg'$    DDI: azithromycin (7/21-24) Mild increase of INR - APAP ~2g/d, PPI, solumedrol.  Goal of Therapy:  INR 2-3 Monitor platelets by anticoagulation protocol: Yes   Plan:  INR stable within goal 2.8>2.9.  Plan to decrease slightly Warfarin '3mg'$  PO x1 (slightly dec'd from home dose d/t start of azithromycin, solumedrol, PPI, & req'ing ~2g APAP/24h.) F/u INR and CBC per protocol  Lorna Dibble 07/29/2021,1:20 PM

## 2021-07-29 NOTE — Plan of Care (Signed)
?  Problem: Education: ?Goal: Ability to demonstrate management of disease process will improve ?Outcome: Progressing ?  ?Problem: Activity: ?Goal: Capacity to carry out activities will improve ?Outcome: Progressing ?  ?Problem: Cardiac: ?Goal: Ability to achieve and maintain adequate cardiopulmonary perfusion will improve ?Outcome: Progressing ?  ?

## 2021-07-29 NOTE — Progress Notes (Signed)
Progress Note  Patient Name: Teresa Cook Date of Encounter: 07/29/2021  [facility] Cardiologist: None   Subjective   Feeling better.  Reports CP at rest and radiation to her back.  +GERD symptoms  Inpatient Medications    Scheduled Meds:  calcium carbonate  1 tablet Oral BID   calcium carbonate  2 tablet Oral Once   Chlorhexidine Gluconate Cloth  6 each Topical Q0600   cinacalcet  60 mg Oral QAC lunch   isosorbide mononitrate  30 mg Oral Daily   melatonin  5 mg Oral QHS   [START ON 07/30/2021] metoprolol succinate  100 mg Oral Daily   metoprolol succinate  50 mg Oral Once   pantoprazole  40 mg Oral BID   sacubitril-valsartan  1 tablet Oral BID   sertraline  150 mg Oral Daily   sevelamer carbonate  2,400 mg Oral TID with meals   traZODone  50 mg Oral QHS   Warfarin - Pharmacist Dosing Inpatient   Does not apply q1600   Continuous Infusions:  PRN Meds: acetaminophen **OR** acetaminophen, acetaminophen-caffeine, albuterol, ALPRAZolam, guaiFENesin-dextromethorphan, hydrALAZINE, nitroGLYCERIN, ondansetron **OR** ondansetron (ZOFRAN) IV, sevelamer carbonate, zolpidem   Vital Signs    Vitals:   07/28/21 2345 07/29/21 0405 07/29/21 0407 07/29/21 0742  BP: 108/80  117/85 129/79  Pulse: (!) 59  73 76  Resp: '18  18 18  '$ Temp: 98.5 F (36.9 C)  98 F (36.7 C) 98.1 F (36.7 C)  TempSrc:      SpO2: 97%  (!) 88% 100%  Weight:  55.2 kg    Height:        Intake/Output Summary (Last 24 hours) at 07/29/2021 1059 Last data filed at 07/28/2021 1900 Gross per 24 hour  Intake --  Output 1801 ml  Net -1801 ml      07/29/2021    4:05 AM 07/28/2021   12:37 PM 07/28/2021    9:17 AM  Last 3 Weights  Weight (lbs) 121 lb 11.1 oz 114 lb 10.2 oz 118 lb 9.7 oz  Weight (kg) 55.2 kg 52 kg 53.8 kg      Telemetry    Sinus rhythm.  PACs. - Personally Reviewed  ECG    N/a - Personally Reviewed  Physical Exam   VS:  BP (!) 138/102 (BP Location: Left Arm)   Pulse 85   Temp  98.1 F (36.7 C)   Resp 18   Ht '5\' 5"'$  (1.651 m)   Wt 55.2 kg   SpO2 98%   BMI 20.25 kg/m  , BMI Body mass index is 20.25 kg/m. GENERAL:  Well appearing HEENT: Pupils equal round and reactive, fundi not visualized, oral mucosa unremarkable NECK:  No jugular venous distention, waveform within normal limits, carotid upstroke brisk and symmetric, no bruits, no thyromegaly LUNGS:  Clear to auscultation bilaterally HEART:  RRR.  PMI not displaced or sustained,S1 and S2 within normal limits, no S3, no S4, no clicks, no rubs, no murmurs ABD:  Flat, positive bowel sounds normal in frequency in pitch, no bruits, no rebound, no guarding, no midline pulsatile mass, no hepatomegaly, no splenomegaly EXT:  2 plus pulses throughout, no edema, no cyanosis no clubbing SKIN:  No rashes no nodules NEURO:  Cranial nerves II through XII grossly intact, motor grossly intact throughout PSYCH:  Cognitively intact, oriented to person place and time  Labs    High Sensitivity Troponin:   Recent Labs  Lab 07/08/21 2203 07/26/21 2218 07/27/21 0011 07/27/21 1312 07/27/21 1635  TROPONINIHS 60* 28* 30* 37* 52*     Chemistry Recent Labs  Lab 07/26/21 2218 07/28/21 0514  NA 142 137  K 3.6 3.6  CL 106 102  CO2 27 28  GLUCOSE 102* 90  BUN 36* 18  CREATININE 8.32* 5.05*  CALCIUM 10.5* 9.4  MG  --  1.8  ALBUMIN  --  3.0*  GFRNONAA 5* 9*  ANIONGAP 9 7    Lipids No results for input(s): "CHOL", "TRIG", "HDL", "LABVLDL", "LDLCALC", "CHOLHDL" in the last 168 hours.  Hematology Recent Labs  Lab 07/26/21 2218 07/27/21 0454 07/28/21 0514  WBC 6.9 6.5 7.4  RBC 3.45* 3.12* 2.95*  HGB 9.6* 8.6* 8.2*  HCT 29.9* 27.1* 24.9*  MCV 86.7 86.9 84.4  MCH 27.8 27.6 27.8  MCHC 32.1 31.7 32.9  RDW 17.6* 17.5* 17.4*  PLT 126* 161 158   Thyroid No results for input(s): "TSH", "FREET4" in the last 168 hours.  BNP Recent Labs  Lab 07/26/21 2210  BNP 1,390.4*    DDimer No results for input(s): "DDIMER" in  the last 168 hours.   Radiology    DG Chest Port 1 View  Result Date: 07/27/2021 CLINICAL DATA:  Chest pain and shortness of breath EXAM: PORTABLE CHEST 1 VIEW COMPARISON:  Chest radiograph 1 day prior FINDINGS: The left-sided vascular catheter is stable terminating in the right atrium. The cardiomediastinal silhouette is stable. Extensive opacities are seen throughout the right lung which have significantly worsened since the study from 1 day prior. Aeration of the left lung is not significantly changed. There is no significant pleural effusion. There is no pneumothorax. The bones are stable. IMPRESSION: New extensive opacities throughout the right lung may reflect new asymmetric alveolar edema, infection, or combination thereof. Electronically Signed   By: Valetta Mole M.D.   On: 07/27/2021 12:06    Cardiac Studies   Echo 01/23/20:  1. Left ventricular ejection fraction, by estimation, is 40 to 45%. Left  ventricular ejection fraction by PLAX is 43 %. The left ventricle has  mildly decreased function. The left ventricle demonstrates regional wall  motion abnormalities (see scoring  diagram/findings for description). There is moderate left ventricular  hypertrophy. Left ventricular diastolic parameters were normal.   2. Right ventricular systolic function is normal. The right ventricular  size is mildly enlarged.   3. Left atrial size was mildly dilated.   4. The mitral valve is grossly normal. Mild mitral valve regurgitation.   5. The aortic valve is calcified. Aortic valve regurgitation is trivial.   Repeat echo pending  Patient Profile     66 y.o. female with ESRD on HD, PAF, sickle cell trait, breast cancer status post bilateral mastectomy, meningioma, seizures, admitted with hypoxic respiratory failure, hypertensive emergency, and acute on chronic systolic and diastolic heart failure.  Assessment & Plan    # Hypoxic respiratory failure: # Acute on chronic systolic and diastolic  heart failure: # Hypertensive emergency: Initially acquired BiPAP.  She has undergone several dialysis sessions and is doing much better.  She had another session of dialysis yesterday and was net -1.8 L.  Blood pressure is now much better controlled.  Continue metoprolol, Entresto, and Imdur.  Baseline EF is 40 to 45%.  Repeat echo is pending.  # PAF:  She is maintaining sinus rhythm.  Continue metoprolol and warfarin.  # ESRD on HD: Volume status managed with HD.  She notes that her dietary intake has been poor and may have been retaining more fluid prior  to admission despite going to all HD sessions.  She doesn't think she was getting completely dry.   # CP # Elevated troponin:  # CAD:  # Hyperlipidemia: Hs troponin mildly elevated and flat.  She has rest CP that doesn't seem ischemic.  Echo pending. She had 60% LCX stenosis at cath 03/2020.  LDL 113.  She is not on aspirin or a statin.  Will start aspirin '81mg'$  and rosuvastatin '20mg'$ .  Check lipids/CMP in 2-3 months.       For questions or updates, please contact [place] Please consult [email or web address] for contact info under        Signed, Skeet Latch, MD  07/29/2021, 10:59 AM

## 2021-07-29 NOTE — Progress Notes (Signed)
[facility]  ROUNDING NOTE   Subjective:   Teresa Cook 66 year old African-American female with past medical conditions including hypertension, gout, anxiety with depression, sickle cell trait, systolic heart failure with a EF 40 to 45%, breast cancer with bilateral mastectomy, seizures, and end-stage renal disease on hemodialysis.  Patient presents to the emergency department with complaints of shortness of breath.  Patient has been admitted for Acute respiratory failure with hypoxia ([place]) [J96.01] Troponin I above reference range [R77.8] Fluid overload [E87.70] Hypervolemia, unspecified hypervolemia type [E87.70]  Patient is known to our practice and receives outpatient dialysis treatments at [facility] on a MWF schedule, supervised by Dr. Candiss Norse.    Update:  Patient resting quietly, alert and oriented Remains on 2 L nasal cannula with acceptable oxygen saturations Tolerating small meals No lower extremity edema Continues to complain of fatigue  Objective:  Vital signs in last 24 hours:  Temp:  [98 F (36.7 C)-98.8 F (37.1 C)] 98.1 F (36.7 C) (07/23 0742) Pulse Rate:  [59-90] 76 (07/23 0742) Resp:  [15-25] 18 (07/23 0742) BP: (108-156)/(79-101) 129/79 (07/23 0742) SpO2:  [88 %-100 %] 100 % (07/23 0742) Weight:  [52 kg-55.2 kg] 55.2 kg (07/23 0405)  Weight change: -1 kg Filed Weights   07/28/21 0917 07/28/21 1237 07/29/21 0405  Weight: 53.8 kg 52 kg 55.2 kg    Intake/Output: I/O last 3 completed shifts: In: -  Out: 2301 [Urine:1; Other:2300]   Intake/Output this shift:  No intake/output data recorded.  Physical Exam: General: Ill-appearing  Head: Normocephalic, atraumatic. Moist oral mucosal membranes  Eyes: Anicteric  Lungs:  Diminished breath sounds, normal effort, 2 L [place]  Heart: Regular rate and rhythm  Abdomen:  Soft, mild tenderness.  Extremities: Trace peripheral edema.  Neurologic: Nonfocal, moving all four extremities   Skin: No lesions  Access: Left PermCath    Basic Metabolic Panel: Recent Labs  Lab 07/26/21 2218 07/28/21 0514  NA 142 137  K 3.6 3.6  CL 106 102  CO2 27 28  GLUCOSE 102* 90  BUN 36* 18  CREATININE 8.32* 5.05*  CALCIUM 10.5* 9.4  MG  --  1.8  PHOS  --  4.1     Liver Function Tests: Recent Labs  Lab 07/28/21 0514  ALBUMIN 3.0*    No results for input(s): "LIPASE", "AMYLASE" in the last 168 hours. No results for input(s): "AMMONIA" in the last 168 hours.  CBC: Recent Labs  Lab 07/26/21 2218 07/27/21 0454 07/28/21 0514  WBC 6.9 6.5 7.4  NEUTROABS 5.2  --   --   HGB 9.6* 8.6* 8.2*  HCT 29.9* 27.1* 24.9*  MCV 86.7 86.9 84.4  PLT 126* 161 158     Cardiac Enzymes: No results for input(s): "CKTOTAL", "CKMB", "CKMBINDEX", "TROPONINI" in the last 168 hours.  BNP: Invalid input(s): "POCBNP"  CBG: Recent Labs  Lab 07/27/21 1207  [place]*     Microbiology: Results for orders placed or performed during the hospital encounter of 07/26/21  SARS Coronavirus 2 by RT PCR (hospital order, performed in [facility] hospital lab) *cepheid single result test* Anterior Nasal Swab     Status: None   Collection Time: 07/26/21 10:36 PM   Specimen: Anterior Nasal Swab  Result Value Ref Range Status   SARS Coronavirus 2 by RT PCR NEGATIVE NEGATIVE Final    Comment: (NOTE) SARS-CoV-2 target nucleic acids are NOT DETECTED.  The SARS-CoV-2 RNA is generally detectable in upper and lower respiratory specimens during the acute phase  of infection. The lowest concentration of SARS-CoV-2 viral copies this assay can detect is 250 copies / mL. A negative result does not preclude SARS-CoV-2 infection and should not be used as the sole basis for treatment or other patient management decisions.  A negative result may occur with improper specimen collection / handling, submission of specimen other than nasopharyngeal swab, presence of viral mutation(s) within the areas targeted  by this assay, and inadequate number of viral copies (<250 copies / mL). A negative result must be combined with clinical observations, patient history, and epidemiological information.  Fact Sheet for Patients:   https://www.patel.info/  Fact Sheet for Healthcare Providers: https://hall.com/  This test is not yet approved or  cleared by the [place] FDA and has been authorized for detection and/or diagnosis of SARS-CoV-2 by FDA under an Emergency Use Authorization (EUA).  This EUA will remain in effect (meaning this test can be used) for the duration of the COVID-19 declaration under Section 564(b)(1) of the Act, 21 U.S.C. section 360bbb-3(b)(1), unless the authorization is terminated or revoked sooner.  Performed at [facility], [place], [place] [address]   Respiratory (~20 pathogens) panel by PCR     Status: None   Collection Time: 07/27/21  8:42 AM   Specimen: Nasopharyngeal Swab; Respiratory  Result Value Ref Range Status   Adenovirus NOT DETECTED NOT DETECTED Final   Coronavirus 229E NOT DETECTED NOT DETECTED Final    Comment: (NOTE) The Coronavirus on the Respiratory Panel, DOES NOT test for the novel  Coronavirus 04-14-2017 nCoV)    Coronavirus HKU1 NOT DETECTED NOT DETECTED Final   Coronavirus NL63 NOT DETECTED NOT DETECTED Final   Coronavirus OC43 NOT DETECTED NOT DETECTED Final   Metapneumovirus NOT DETECTED NOT DETECTED Final   Rhinovirus / Enterovirus NOT DETECTED NOT DETECTED Final   Influenza A NOT DETECTED NOT DETECTED Final   Influenza B NOT DETECTED NOT DETECTED Final   Parainfluenza Virus 1 NOT DETECTED NOT DETECTED Final   Parainfluenza Virus 2 NOT DETECTED NOT DETECTED Final   Parainfluenza Virus 3 NOT DETECTED NOT DETECTED Final   Parainfluenza Virus 4 NOT DETECTED NOT DETECTED Final   Respiratory Syncytial Virus NOT DETECTED NOT DETECTED Final   Bordetella pertussis NOT DETECTED NOT  DETECTED Final   Bordetella Parapertussis NOT DETECTED NOT DETECTED Final   Chlamydophila pneumoniae NOT DETECTED NOT DETECTED Final   Mycoplasma pneumoniae NOT DETECTED NOT DETECTED Final    Comment: Performed at [facility], [place]. [address] [place],  [address]    Coagulation Studies: Recent Labs    07/26/21 04/14/16 07/27/21 0454 07/28/21 0514 07/29/21 0643  LABPROT 27.6* 29.6* 29.4* 30.0*  INR 2.6* 2.8* 2.8* 2.9*     Urinalysis: No results for input(s): "COLORURINE", "LABSPEC", "PHURINE", "GLUCOSEU", "HGBUR", "BILIRUBINUR", "KETONESUR", "PROTEINUR", "UROBILINOGEN", "NITRITE", "LEUKOCYTESUR" in the last 72 hours.  Invalid input(s): "APPERANCEUR"    Imaging: DG Chest Port 1 View  Result Date: 07/27/2021 CLINICAL DATA:  Chest pain and shortness of breath EXAM: PORTABLE CHEST 1 VIEW COMPARISON:  Chest radiograph 1 day prior FINDINGS: The left-sided vascular catheter is stable terminating in the right atrium. The cardiomediastinal silhouette is stable. Extensive opacities are seen throughout the right lung which have significantly worsened since the study from 1 day prior. Aeration of the left lung is not significantly changed. There is no significant pleural effusion. There is no pneumothorax. The bones are stable. IMPRESSION: New extensive opacities throughout the right lung may reflect new asymmetric  alveolar edema, infection, or combination thereof. Electronically Signed   By: Valetta Mole M.D.   On: 07/27/2021 12:06     Medications:      calcium carbonate  1 tablet Oral BID   calcium carbonate  2 tablet Oral Once   Chlorhexidine Gluconate Cloth  6 each Topical Q0600   cinacalcet  60 mg Oral QAC lunch   isosorbide mononitrate  30 mg Oral Daily   melatonin  5 mg Oral QHS   [START ON 07/30/2021] metoprolol succinate  100 mg Oral Daily   metoprolol succinate  50 mg Oral Once   pantoprazole  40 mg Oral BID   sacubitril-valsartan  1 tablet Oral BID   sertraline  150  mg Oral Daily   sevelamer carbonate  2,400 mg Oral TID with meals   traZODone  50 mg Oral QHS   Warfarin - Pharmacist Dosing Inpatient   Does not apply q1600   acetaminophen **OR** acetaminophen, acetaminophen-caffeine, albuterol, ALPRAZolam, guaiFENesin-dextromethorphan, hydrALAZINE, nitroGLYCERIN, ondansetron **OR** ondansetron (ZOFRAN) IV, sevelamer carbonate, zolpidem  Assessment/ Plan:  [name] is a 66 y.o.  female with past medical conditions including hypertension, gout, anxiety with depression, sickle cell trait, systolic heart failure with a EF 40 to 45%, breast cancer with bilateral mastectomy, seizures, and end-stage renal disease on hemodialysis.  Patient presents to the emergency department with complaints of shortness of breath.  Patient has been admitted for Acute respiratory failure with hypoxia ([place]) [J96.01] Troponin I above reference range [R77.8] Fluid overload [E87.70] Hypervolemia, unspecified hypervolemia type [E87.70]   [facility]/MWF/Lt Permcath/55kg  End-stage renal disease on hemodialysis.  Will maintain outpatient schedule if possible.  Hepatitis B panel ordered as hospital protocol dictates.  Patient received UF only treatment yesterday with 1.8 L achieved.  Next treatment scheduled for Monday.  2.  Acute respiratory failure with hypoxia.  Chest x-ray ordered shows new extensive opacities throughout the right lung concerning for alveolar edema, infection or combination of both.    Weaned to 2L. Primary team to order walking trial to determine home oxygen needs.   3.  Acute on chronic systolic heart failure.  Echo completed on 01/23/2020 shows EF 40 to 45% with a moderate LVH.  Patient has had multiple emergency department visits for shortness of breath and pulmonary edema.   Awaiting ordered echo.  4. Anemia of chronic kidney disease Lab Results  Component Value Date   HGB 8.2 (L) 07/28/2021  Hgb below target Patient receives [facility]  outpatient.  We will monitor  5. Secondary Hyperparathyroidism: Lab Results  Component Value Date   PTH 214 (H) 07/08/2021   CALCIUM 9.4 07/28/2021   PHOS 4.1 07/28/2021   Continue calcium carbonate and sevelamer.    LOS: 3 Teresa Cook 7/23/202311:25 AM

## 2021-07-29 NOTE — Assessment & Plan Note (Signed)
Increase PPI to twice daily dosing

## 2021-07-29 NOTE — Evaluation (Signed)
Physical Therapy Evaluation Patient Details Name: Teresa Cook MRN: [id number] DOB: September 14, 1955 Today's Date: 07/29/2021  History of Present Illness  Teresa Cook is a 66 y.o. female with medical history significant for ESRD-HD (MWF), hypertension, gout, depression with anxiety, paroxysmal A-fib on Coumadin, sCHF with EF 40-45%, bilateral breast cancer (s/p bilateral mastectomy, seizure not on medications, meningioma and chronic headaches, hyperparathyroidism recently discharged on 7/4 for management of volume overload who presents with a 1 day history of shortness of breath, orthopnea, chest tightness and cough.   Clinical Impression  Pt admitted with above diagnosis. Pt received supine in bed agreeable to PT. Titrated by RN to RA to assess O2 need. Pt reports at baseline she is independent with mobility (household and short community distances) and with ADL's. Has daughter and grand daughter close by that assist PRN.   To date, pt remains > 90% on RA at rest and with mobility. Able to transfer mod-I to EOB with increased time and bed features. Pt standing with CGA with bouts of momentum and need for counter top to fully attain standing. Initially unsteady reaching for counter top with gait but able to ambulate ~180' without counter support. Pt displays minor variable step lengths and drifting with CGA. Pt able to correct gait mechanics without LOB or need for PT assist. Pt returning to recliner visibly fatigued, minor SOB but Spo2 at 93%. All needs in reach, rec [facility] PT at discharge for energy conservation, balance/gait training. Pt currently with functional limitations due to the deficits listed below (see PT Problem List). Pt will benefit from skilled PT to increase their independence and safety with mobility to allow discharge to the venue listed below.          Recommendations for follow up therapy are one component of a multi-disciplinary discharge planning process, led by the attending  physician.  Recommendations may be updated based on patient status, additional functional criteria and insurance authorization.  Follow Up Recommendations Home health PT      Assistance Recommended at Discharge Frequent or constant Supervision/Assistance  Patient can return home with the following  A little help with walking and/or transfers;Assist for transportation;Assistance with cooking/housework;Help with stairs or ramp for entrance    Equipment Recommendations Rolling walker (2 wheels)  Recommendations for Other Services       Functional Status Assessment Patient has had a recent decline in their functional status and demonstrates the ability to make significant improvements in function in a reasonable and predictable amount of time.     Precautions / Restrictions Precautions Precautions: Fall Restrictions Weight Bearing Restrictions: No      Mobility  Bed Mobility Overal bed mobility: Modified Independent             General bed mobility comments: increased time and bed features Patient Response: Cooperative  Transfers Overall transfer level: Needs assistance Equipment used: None Transfers: Sit to/from Stand Sit to Stand: Min guard           General transfer comment: bouts of momentum and min use of counter top to safely stand    Ambulation/Gait Ambulation/Gait assistance: Min guard Gait Distance (Feet): 180 Feet Assistive device: None Gait Pattern/deviations: Step-to pattern, Drifts right/left, Narrow base of support       General Gait Details: Pt slightly unsteady and slow moving with gait with intermittent variable step lengths and mild drifting R/L but able to correct on her own.  Stairs            Emergency planning/management officer  Modified Rankin (Stroke Patients Only)       Balance Overall balance assessment: Needs assistance Sitting-balance support: No upper extremity supported, Feet supported Sitting balance-Leahy Scale: Fair      Standing balance support: No upper extremity supported Standing balance-Leahy Scale: Fair                               Pertinent Vitals/Pain Pain Assessment Pain Assessment: Faces Faces Pain Scale: Hurts little more Pain Location: headache Pain Descriptors / Indicators: Dull, Headache Pain Intervention(s): Limited activity within patient's tolerance, Monitored during session    [place] expects to be discharged to:: Private residence Living Arrangements: Alone Available Help at Discharge: Family;Available PRN/intermittently Type of Home: House Home Access: Stairs to enter Entrance Stairs-Rails: Left Entrance Stairs-Number of Steps: 3   Home Layout: One level Home Equipment: None      Prior Function Prior Level of Function : Independent/Modified Independent               ADLs Comments: Reports daughter and grand daughter assist PRN for meals and other ADL's/IADL's     Hand Dominance        Extremity/Trunk Assessment   Upper Extremity Assessment Upper Extremity Assessment: Defer to OT evaluation    Lower Extremity Assessment Lower Extremity Assessment: Generalized weakness    Cervical / Trunk Assessment Cervical / Trunk Assessment: Normal  Communication   Communication: No difficulties  Cognition Arousal/Alertness: Awake/alert Behavior During Therapy: WFL for tasks assessed/performed Overall Cognitive Status: Within Functional Limits for tasks assessed                                          General Comments      Exercises Other Exercises Other Exercises: Role of PT in acute setting, d/c recs   Assessment/Plan    PT Assessment Patient needs continued PT services  PT Problem List Decreased strength;Decreased activity tolerance;Decreased balance;Decreased mobility       PT Treatment Interventions DME instruction;Balance training;Gait training;Neuromuscular re-education;Stair training;Functional  mobility training;Therapeutic activities;Patient/family education;Therapeutic exercise    PT Goals (Current goals can be found in the Care Plan section)  Acute Rehab PT Goals Patient Stated Goal: improve strength and walking PT Goal Formulation: With patient Time For Goal Achievement: 08/12/21 Potential to Achieve Goals: Good    Frequency Min 2X/week     Co-evaluation               AM-PAC PT "6 Clicks" Mobility  Outcome Measure Help needed turning from your back to your side while in a flat bed without using bedrails?: A Little Help needed moving from lying on your back to sitting on the side of a flat bed without using bedrails?: A Little Help needed moving to and from a bed to a chair (including a wheelchair)?: A Little Help needed standing up from a chair using your arms (e.g., wheelchair or bedside chair)?: A Lot Help needed to walk in hospital room?: A Little Help needed climbing 3-5 steps with a railing? : A Lot 6 Click Score: 16    End of Session Equipment Utilized During Treatment: Gait belt Activity Tolerance: Patient tolerated treatment well Patient left: in chair Nurse Communication: Mobility status PT Visit Diagnosis: Unsteadiness on feet (R26.81);Other abnormalities of gait and mobility (R26.89);Muscle weakness (generalized) (M62.81)    Time: 3704-8889 PT  Time Calculation (min) (ACUTE ONLY): 21 min   Charges:   PT Evaluation $PT Eval Moderate Complexity: 1 Mod PT Treatments $Therapeutic Activity: 8-22 mins      Royalty Domagala M. Fairly IV, PT, DPT Physical Therapist- [facility]  07/29/2021, 12:50 PM

## 2021-07-30 DIAGNOSIS — K219 Gastro-esophageal reflux disease without esophagitis: Secondary | ICD-10-CM

## 2021-07-30 DIAGNOSIS — G4734 Idiopathic sleep related nonobstructive alveolar hypoventilation: Secondary | ICD-10-CM

## 2021-07-30 DIAGNOSIS — I5023 Acute on chronic systolic (congestive) heart failure: Secondary | ICD-10-CM | POA: Diagnosis not present

## 2021-07-30 DIAGNOSIS — N186 End stage renal disease: Secondary | ICD-10-CM | POA: Diagnosis not present

## 2021-07-30 DIAGNOSIS — J9601 Acute respiratory failure with hypoxia: Secondary | ICD-10-CM | POA: Diagnosis not present

## 2021-07-30 DIAGNOSIS — R778 Other specified abnormalities of plasma proteins: Secondary | ICD-10-CM

## 2021-07-30 LAB — ECHOCARDIOGRAM COMPLETE
AR max vel: 1.56 cm2
AV Peak grad: 10 mmHg
Ao pk vel: 1.58 m/s
Area-P 1/2: 4.68 cm2
Calc EF: 55.3 %
Height: 65 in
S' Lateral: 2.38 cm
Single Plane A2C EF: 56.2 %
Single Plane A4C EF: 56.6 %
Weight: 1947.1 oz

## 2021-07-30 LAB — BASIC METABOLIC PANEL
Anion gap: 13 (ref 5–15)
BUN: 46 mg/dL — ABNORMAL HIGH (ref 8–23)
CO2: 27 mmol/L (ref 22–32)
Calcium: 10.1 mg/dL (ref 8.9–10.3)
Chloride: 96 mmol/L — ABNORMAL LOW (ref 98–111)
Creatinine, Ser: 10.2 mg/dL — ABNORMAL HIGH (ref 0.44–1.00)
GFR, Estimated: 4 mL/min — ABNORMAL LOW (ref >60)
Glucose, Bld: 93 mg/dL (ref 70–99)
Potassium: 4.2 mmol/L (ref 3.5–5.1)
Sodium: 136 mmol/L (ref 135–145)

## 2021-07-30 LAB — CBC
HCT: 25.2 % — ABNORMAL LOW (ref 36.0–46.0)
Hemoglobin: 8.3 g/dL — ABNORMAL LOW (ref 12.0–15.0)
MCH: 28.2 pg (ref 26.0–34.0)
MCHC: 32.9 g/dL (ref 30.0–36.0)
MCV: 85.7 fL (ref 80.0–100.0)
Platelets: 176 10*3/uL (ref 150–400)
RBC: 2.94 MIL/uL — ABNORMAL LOW (ref 3.87–5.11)
RDW: 17.3 % — ABNORMAL HIGH (ref 11.5–15.5)
WBC: 6.7 10*3/uL (ref 4.0–10.5)
nRBC: 0 % (ref 0.0–0.2)

## 2021-07-30 LAB — PROTIME-INR
INR: 2.7 — ABNORMAL HIGH (ref 0.8–1.2)
Prothrombin Time: 28.2 seconds — ABNORMAL HIGH (ref 11.4–15.2)

## 2021-07-30 MED ORDER — ISOSORBIDE MONONITRATE ER 30 MG PO TB24: 30.0000 mg | ORAL_TABLET | Freq: Every day | ORAL | 0 refills | Status: DC

## 2021-07-30 MED ORDER — PANTOPRAZOLE SODIUM 40 MG PO TBEC: 40.0000 mg | DELAYED_RELEASE_TABLET | Freq: Two times a day (BID) | ORAL | 0 refills | Status: AC

## 2021-07-30 MED ORDER — ZOLPIDEM TARTRATE 10 MG PO TABS: 5.0000 mg | ORAL_TABLET | Freq: Every day | ORAL | 0 refills | Status: AC

## 2021-07-30 MED ORDER — SEVELAMER CARBONATE 800 MG PO TABS: 2400.0000 mg | ORAL_TABLET | Freq: Three times a day (TID) | ORAL | 0 refills | Status: DC

## 2021-07-30 MED ORDER — ATORVASTATIN CALCIUM 40 MG PO TABS: 40.0000 mg | ORAL_TABLET | Freq: Every day | ORAL | 0 refills | Status: DC

## 2021-07-30 MED ORDER — MEGESTROL ACETATE 400 MG/10ML PO SUSP: 200.0000 mg | Freq: Every day | ORAL | 0 refills | Status: DC

## 2021-07-30 MED ORDER — WARFARIN SODIUM 5 MG PO TABS
5.0000 mg | ORAL_TABLET | Freq: Once | ORAL | Status: AC
  Administered 2021-07-30: 5 mg via ORAL
  Filled 2021-07-30: qty 1

## 2021-07-30 MED ORDER — CALCIUM CARBONATE ANTACID 500 MG PO CHEW: 1.0000 | CHEWABLE_TABLET | Freq: Two times a day (BID) | ORAL | 0 refills | Status: DC | PRN

## 2021-07-30 MED ORDER — MEGESTROL ACETATE 400 MG/10ML PO SUSP
200.0000 mg | Freq: Every day | ORAL | Status: DC
Start: 2021-07-30 — End: 2021-07-30
  Administered 2021-07-30: 200 mg via ORAL
  Filled 2021-07-30: qty 10

## 2021-07-30 MED ORDER — HEPARIN SODIUM (PORCINE) 1000 UNIT/ML IJ SOLN
INTRAMUSCULAR | Status: AC
  Filled 2021-07-30: qty 4

## 2021-07-30 MED ORDER — PAROXETINE HCL 40 MG PO TABS: 40.0000 mg | ORAL_TABLET | Freq: Every day | ORAL | 0 refills | Status: DC

## 2021-07-30 MED ORDER — CARVEDILOL 12.5 MG PO TABS: 12.5000 mg | ORAL_TABLET | Freq: Two times a day (BID) | ORAL | 0 refills | Status: DC

## 2021-07-30 MED ORDER — HYDRALAZINE HCL 25 MG PO TABS: 25.0000 mg | ORAL_TABLET | Freq: Three times a day (TID) | ORAL | 0 refills | Status: DC

## 2021-07-30 NOTE — Progress Notes (Signed)
ANTICOAGULATION CONSULT NOTE - Initial Consult  Pharmacy Consult for warfarin Indication: atrial fibrillation, hx DVT  Allergies  Allergen Reactions   Gabapentin Other (See Comments)    Seizure   Adhesive [Tape] Itching    Silk tape is ok to use.    Patient Measurements: Height: '5\' 5"'$  (165.1 cm) Weight: 58.1 kg (128 lb 1.6 oz) IBW/kg (Calculated) : 57 Heparin Dosing Weight:    Vital Signs: Temp: 97.9 F (36.6 C) (07/24 0757) Temp Source: Oral (07/24 0459) BP: 131/86 (07/24 0757) Pulse Rate: 74 (07/24 0757)  Labs: Recent Labs    07/27/21 1312 07/27/21 1635 07/28/21 0514 07/29/21 0643 07/30/21 0641  HGB  --   --  8.2*  --  8.3*  HCT  --   --  24.9*  --  25.2*  PLT  --   --  158  --  176  LABPROT  --   --  29.4* 30.0* 28.2*  INR  --   --  2.8* 2.9* 2.7*  CREATININE  --   --  5.05*  --   --   TROPONINIHS 37* 52*  --   --   --      Estimated Creatinine Clearance: 9.7 mL/min (A) (by C-G formula based on SCr of 5.05 mg/dL (H)).   Medical History: Past Medical History:  Diagnosis Date   Anemia    Anxiety    Breast cancer ([place]) 01/2016   bilateral   CHF (congestive heart failure) (HCC)    Chronic kidney disease    Depression    Dialysis patient [facility])    DVT (deep venous thrombosis) (HCC)    left leg   DVT (deep venous thrombosis) ([place]) 1985   right thigh   Dysrhythmia    Gout    Headache    HTN (hypertension)    Hypertension    Parathyroid abnormality (HCC)    Parathyroid disease ([place])    Pneumonia 12/2015   Psoriasis    Renal insufficiency    Sickle cell trait (HCC)    traits    Medications:  Scheduled:   aspirin EC  81 mg Oral Daily   atorvastatin  40 mg Oral Daily   calcium carbonate  1 tablet Oral BID   calcium carbonate  2 tablet Oral Once   Chlorhexidine Gluconate Cloth  6 each Topical Q0600   cinacalcet  60 mg Oral QAC lunch   hydrALAZINE  25 mg Oral TID   isosorbide mononitrate  30 mg Oral Daily   melatonin  5 mg Oral QHS    metoprolol succinate  100 mg Oral Daily   pantoprazole  40 mg Oral BID   sacubitril-valsartan  1 tablet Oral BID   sertraline  150 mg Oral Daily   sevelamer carbonate  2,400 mg Oral TID with meals   traZODone  50 mg Oral QHS   Warfarin - Pharmacist Dosing Inpatient   Does not apply q1600   Infusions:    Assessment: 66 yo F to continue warfarin therapy for Afib Home regimen: 5 mg M,W,F,Sa,Su and 7.5 mg T,TH Hgb 9.6  Plt 126  7/20 INR 2.6 - (PTA VKA 7.'5mg'$  '@1230'$ ) 7/21 INR 2.8 - VKA '4mg'$   7/22 INR 2.8 - VKA '4mg'$  7/23 INR 2.9 - VKA '3mg'$  7/24 INR 2.7 - VKA    DDI: azithromycin (7/21 x1 ) Mild increase of INR - APAP ~2g/d, PPI, solumedrol.  Goal of Therapy:  INR 2-3 Monitor platelets by anticoagulation protocol: Yes   Plan:  INR stable within goal  Plan resume home dose with 5 mg PO x 1 tonight F/u INR and CBC per protocol  Dorothe Pea, PharmD, BCPS Clinical Pharmacist   07/30/2021,8:08 AM

## 2021-07-30 NOTE — Discharge Instructions (Signed)
Toprol switched to coreg  Zoloft changed to Paxil

## 2021-07-30 NOTE — TOC Transition Note (Signed)
Transition of Care [facility]) - CM/SW Discharge Note   Patient Details  Name: Teresa Cook MRN: [id number] Date of Birth: 01/23/1955  Transition of Care [facility] CM/SW Contact:  Laurena Slimmer, RN Phone Number: 07/30/2021, 2:11 PM   Clinical Narrative:    Spoke with patient at bedside  Admitted from: Home  PCP: Princella Ion   Ph[facility]acy:[facility]   Patient daughter will transport her home Patient is able to drive herself to appointment Discussed [place] rec. Patient declined.     Final next level of care: Home/Self Care Barriers to Discharge: Barriers Resolved   Patient Goals and CMS Choice Patient states their goals for this hospitalization and ongoing recovery are:: To return home      Discharge Placement                       Discharge Plan and Services                                     Social Determinants of Health (SDOH) Interventions     Readmission Risk Interventions    07/30/2021    2:09 PM  Readmission Risk Prevention Plan  Transportation Screening Complete  Medication Review ([place]) Complete  PCP or Specialist appointment within 3-5 days of discharge Complete  HRI or [place] Not Applicable

## 2021-07-30 NOTE — Care Management Important Message (Signed)
Important Message  Patient Details  Name: Tsuyako Jolley MRN: [id number] Date of Birth: 03-08-1955   Medicare Important Message Given:  Yes  Patient out of room upon time of visit.  Copy of Medicare IM left in room for reference.     [name] 07/30/2021, 11:46 AM

## 2021-07-30 NOTE — Discharge Summary (Signed)
Physician Discharge Summary   Patient: Teresa Cook MRN: [id number] DOB: [birth year]  Admit date:     07/26/2021  Discharge date: 07/30/21  Discharge Physician: Loletha Grayer   PCP: [facility] [place]   Recommendations at discharge:   Follow-up PCP 5 days Follow-up dialysis as scheduled.  Discharge Diagnoses: Principal Problem:   Acute respiratory failure with hypoxia (HCC) Active Problems:   Acute on chronic systolic CHF (congestive heart failure) (HCC)   Hypertensive emergency   Nocturnal hypoxia   ESRD on dialysis ([place])   Atypical pneumonia   Anemia in ESRD (end-stage renal disease) (HCC)   Meningioma, cerebral (HCC)   Seizures (HCC)   NICM (nonischemic cardiomyopathy) (HCC)   Troponin I above reference range   Paroxysmal A-fib ([place])   GERD (gastroesophageal reflux disease)    Hospital Course: Patient was admitted on 07/27/2021 and discharged on 07/30/2021.  Came in with shortness of breath and found to have acute respiratory failure and acute on chronic systolic congestive heart failure.  A rapid response was called down in the dialysis unit on 07/27/2021 and the patient had to be transferred to the ICU for BiPAP and she improved after dialysis was continued in the ICU.  The patient was able to come off oxygen during the day prior to discharge.  An overnight oximetry shows that she does desaturate overnight.  She qualified for nocturnal oxygen which we set up.  Do recommend an outpatient sleep study.  Since the patient does not urinate dialysis is the only way to manage fluid.  The patient did tell me that she does have palpitations at home even at rest.  Case discussed with nephrology and will change Toprol XL over to Coreg 12.5 mg twice a day.  This medication is not dialyzed out and should have better coverage throughout the day.  The patient also has anxiety and nephrology recommended switching over to Paxil.  Since she is already on Zoloft should be  able to switch over to Paxil 40 mg daily.  Nephrology also started Megace to increase appetite.  Assessment and Plan: * Acute respiratory failure with hypoxia ([place]) Patient hypoxic to 88% on room air, tachypneic and and short of breath requiring transfer to the ICU on 07/27/2021 for BiPAP.  She required urgent dialysis.  This morning patient was on high flow nasal cannula 4 L and was able to come off oxygen completely.  The patient was able to come off the oxygen completely during the day.  Acute on chronic systolic CHF (congestive heart failure) (HCC) Echocardiogram shows an EF of 50 to 55%.  This is an improvement from the last EF of 40 to 45%.  Dialysis to manage fluid.  The patient does not urinate so dialysis is the only way to manage fluid.  Change Toprol over to Coreg 12.5 mg twice a day.  Patient also on Entresto.  Add hydralazine today.  Continue Imdur.  Nocturnal hypoxia Overnight pulse oximetry shows desaturations into the 70s and 80s.  The patient does qualify for nocturnal oxygen 2 L nasal cannula at night.  Recommend an outpatient sleep study.  Hypertensive emergency A blood pressure reading of 190/128  in the emergency room.  Continue Imdur, Entresto.  Added hydralazine.  Toprol switched over to Coreg.  ESRD on dialysis [facility]) Patient had an extra dialysis session on Saturday.  Had dialysis again today and kept on usual schedule.  Atypical pneumonia Ruled out.  Viral respiratory panel negative.  This is likely secondary to  fluid overload and not pneumonia.  Anemia in ESRD (end-stage renal disease) (HCC) Last hemoglobin 8.3.  Meningioma, cerebral ([place]) With history of chronic headaches  GERD (gastroesophageal reflux disease) Increase PPI to twice daily dosing  Paroxysmal A-fib (HCC) Continue Coumadin and metoprolol  Seizures ([place]) Not currently on antiepileptic drugs         Consultants: Critical care specialist, nephrology, cardiology Procedures performed:  None Disposition: Home Diet recommendation:  Renal diet DISCHARGE MEDICATION: Allergies as of 07/30/2021       Reactions   Gabapentin Other (See Comments)   Seizure   Adhesive [tape] Itching   Silk tape is ok to use.        Medication List     STOP taking these medications    loperamide 2 MG capsule Commonly known as: IMODIUM   metoprolol succinate 50 MG 24 hr tablet Commonly known as: TOPROL-XL   sertraline 100 MG tablet Commonly known as: ZOLOFT       TAKE these medications    albuterol 108 (90 Base) MCG/ACT inhaler Commonly known as: VENTOLIN HFA Inhale 2 puffs into the lungs every 4 (four) hours as needed for wheezing or shortness of breath.   aspirin-acetaminophen-caffeine 250-250-65 MG tablet Commonly known as: EXCEDRIN MIGRAINE Take 1-2 tablets by mouth every 6 (six) hours as needed for headache.   atorvastatin 40 MG tablet Commonly known as: LIPITOR Take 1 tablet (40 mg total) by mouth daily. Start taking on: July 31, 2021   b complex-vitamin c-folic acid 0.8 MG Tabs tablet Take 1 tablet by mouth daily.   calcium carbonate 500 MG chewable tablet Commonly known as: TUMS - dosed in mg elemental calcium Chew 1 tablet (200 mg of elemental calcium total) by mouth 2 (two) times daily as needed for indigestion or heartburn.   carvedilol 12.5 MG tablet Commonly known as: Coreg Take 1 tablet (12.5 mg total) by mouth 2 (two) times daily.   cinacalcet 60 MG tablet Commonly known as: SENSIPAR Take 60 mg by mouth daily before lunch.   Entresto 24-26 MG Generic drug: sacubitril-valsartan Take 1 tablet by mouth 2 (two) times daily.   hydrALAZINE 25 MG tablet Commonly known as: APRESOLINE Take 1 tablet (25 mg total) by mouth 3 (three) times daily.   isosorbide mononitrate 30 MG 24 hr tablet Commonly known as: IMDUR Take 1 tablet (30 mg total) by mouth daily. Start taking on: July 31, 2021   megestrol 400 MG/10ML suspension Commonly known as:  MEGACE Take 5 mLs (200 mg total) by mouth daily.   pantoprazole 40 MG tablet Commonly known as: PROTONIX Take 1 tablet (40 mg total) by mouth 2 (two) times daily. What changed: when to take this   PARoxetine 40 MG tablet Commonly known as: Paxil Take 1 tablet (40 mg total) by mouth daily.   sevelamer carbonate 800 MG tablet Commonly known as: RENVELA Take 3 tablets (2,400 mg total) by mouth with breakfast, with lunch, and with evening meal. One with snacks What changed:  how much to take when to take this additional instructions   traZODone 50 MG tablet Commonly known as: DESYREL Take 50 mg by mouth at bedtime.   warfarin 5 MG tablet Commonly known as: COUMADIN Take 1-1.5 tablets (5-7.5 mg total) by mouth See admin instructions. Take 1 tablet ('5mg'$ ) by mouth every Monday, Wednesday, Friday, Saturday and Sunday evening and take 1.5  tablets (7.'5mg'$ ) by mouth every Tuesday and Thursday evening   zolpidem 10 MG tablet Commonly known as: AMBIEN  Take 0.5 tablets (5 mg total) by mouth at bedtime. What changed: how much to take               Durable Medical Equipment  (From admission, onward)           Start     Ordered   07/30/21 1309  For home use only DME oxygen  Once       Question Answer Comment  Length of Need Lifetime   Mode or (Route) Nasal cannula   Liters per Minute 2   Frequency Only at night (stationary unit needed)   Oxygen conserving device Yes   Oxygen delivery system Gas      07/30/21 1308            [place], [place]. Go in 1 week(s).   Specialty: General Practice Why: Appointment on Monday 08/06/2021 at 10:40am. Contact information: [address]. [place] [address] [phone]         Wellington Hampshire, MD Follow up in 2 week(s).   Specialty: Cardiology Why: Please call and schedule appointment with Dr. Fletcher Anon. Contact information: [address] [place] [address] [phone]                Discharge Exam: Danley Danker Weights   07/29/21 0405 07/30/21 0459 07/30/21 0800  Weight: 55.2 kg 58.1 kg 58 kg   Physical Exam HENT:     Head: Normocephalic.     Mouth/Throat:     Pharynx: No oropharyngeal exudate.  Eyes:     General: Lids are normal.     Conjunctiva/sclera: Conjunctivae normal.  Cardiovascular:     Rate and Rhythm: Normal rate and regular rhythm.     Heart sounds: Normal heart sounds, S1 normal and S2 normal.  Pulmonary:     Effort: No accessory muscle usage.     Breath sounds: No decreased breath sounds or rales.  Abdominal:     Palpations: Abdomen is soft.     Tenderness: There is no abdominal tenderness.  Musculoskeletal:     Right lower leg: Swelling present.     Left lower leg: Swelling present.  Skin:    General: Skin is warm.     Findings: No rash.  Neurological:     Mental Status: She is alert and oriented to person, place, and time.      Condition at discharge: stable  The results of significant diagnostics from this hospitalization (including imaging, microbiology, ancillary and laboratory) are listed below for reference.   Imaging Studies: ECHOCARDIOGRAM COMPLETE  Result Date: 07/30/2021    ECHOCARDIOGRAM REPORT   Patient Name:   YULANDA DIGGS Date of Exam: 07/29/2021 Medical Rec #:  [id number]       Height:       65.0 in Accession #:    [id number]      Weight:       121.7 lb Date of Birth:  1955-08-29       BSA:          1.602 m Patient Age:    1 years        BP:           138/102 mmHg Patient Gender: F               HR:           80 bpm. Exam Location:  [facility] Procedure: 2D Echo Indications:  CHF I50.21  History:         Patient has prior history of Echocardiogram examinations, most                  recent 01/23/2020.  Sonographer:     Kathlen Brunswick RDCS Referring Phys:  [id number] Loletha Grayer Diagnosing Phys: Kathlyn Sacramento MD IMPRESSIONS  1. Left ventricular ejection fraction, by  estimation, is 50 to 55%. The left ventricle has low normal function. Left ventricular endocardial border not optimally defined to evaluate regional wall motion. There is moderate concentric left ventricular hypertrophy. Left ventricular diastolic parameters are consistent with Grade II diastolic dysfunction (pseudonormalization).  2. Right ventricular systolic function is normal. The right ventricular size is normal. There is mildly elevated pulmonary artery systolic pressure.  3. Left atrial size was mildly dilated.  4. The mitral valve is normal in structure. Mild to moderate mitral valve regurgitation. No evidence of mitral stenosis.  5. The aortic valve is calcified. There is severe calcifcation of the aortic valve. Aortic valve regurgitation is trivial. Aortic valve sclerosis/calcification is present, without any evidence of aortic stenosis.  6. The inferior vena cava is normal in size with <50% respiratory variability, suggesting right atrial pressure of 8 mmHg. FINDINGS  Left Ventricle: Left ventricular ejection fraction, by estimation, is 50 to 55%. The left ventricle has low normal function. Left ventricular endocardial border not optimally defined to evaluate regional wall motion. The left ventricular internal cavity  size was normal in size. There is moderate concentric left ventricular hypertrophy. Left ventricular diastolic parameters are consistent with Grade II diastolic dysfunction (pseudonormalization). Right Ventricle: The right ventricular size is normal. No increase in right ventricular wall thickness. Right ventricular systolic function is normal. There is mildly elevated pulmonary artery systolic pressure. The tricuspid regurgitant velocity is 2.88  m/s, and with an assumed right atrial pressure of 8 mmHg, the estimated right ventricular systolic pressure is 53.9 mmHg. Left Atrium: Left atrial size was mildly dilated. Right Atrium: Right atrial size was normal in size. Pericardium: There is no  evidence of pericardial effusion. Mitral Valve: The mitral valve is normal in structure. There is mild thickening of the mitral valve leaflet(s). There is mild calcification of the mitral valve leaflet(s). Mild mitral annular calcification. Mild to moderate mitral valve regurgitation. No  evidence of mitral valve stenosis. Tricuspid Valve: The tricuspid valve is normal in structure. Tricuspid valve regurgitation is mild . No evidence of tricuspid stenosis. Aortic Valve: The aortic valve is calcified. There is severe calcifcation of the aortic valve. Aortic valve regurgitation is trivial. Aortic valve sclerosis/calcification is present, without any evidence of aortic stenosis. Aortic valve peak gradient measures 10.0 mmHg. Pulmonic Valve: The pulmonic valve was normal in structure. Pulmonic valve regurgitation is not visualized. No evidence of pulmonic stenosis. Aorta: The aortic root is normal in size and structure. Venous: The inferior vena cava is normal in size with less than 50% respiratory variability, suggesting right atrial pressure of 8 mmHg. IAS/Shunts: No atrial level shunt detected by color flow Doppler.  LEFT VENTRICLE PLAX 2D LVIDd:         3.36 cm      Diastology LVIDs:         2.38 cm      LV e' medial:    3.92 cm/s LV PW:         1.44 cm      LV E/e' medial:  21.6 LV IVS:        1.53  cm      LV e' lateral:   4.79 cm/s LVOT diam:     2.00 cm      LV E/e' lateral: 17.7 LV SV:         44 LV SV Index:   27 LVOT Area:     3.14 cm  LV Volumes (MOD) LV vol d, MOD A2C: 93.0 ml LV vol d, MOD A4C: 107.0 ml LV vol s, MOD A2C: 40.7 ml LV vol s, MOD A4C: 46.4 ml LV SV MOD A2C:     52.3 ml LV SV MOD A4C:     107.0 ml LV SV MOD BP:      56.0 ml RIGHT VENTRICLE RV Basal diam:  2.70 cm TAPSE (M-mode): 1.9 cm LEFT ATRIUM             Index        RIGHT ATRIUM           Index LA diam:        3.50 cm 2.19 cm/m   RA Area:     10.60 cm LA Vol (A2C):   53.7 ml 33.53 ml/m  RA Volume:   21.50 ml  13.42 ml/m LA Vol (A4C):    53.5 ml 33.40 ml/m LA Biplane Vol: 56.9 ml 35.53 ml/m  AORTIC VALVE                 PULMONIC VALVE AV Area (Vmax): 1.56 cm     PV Vmax:       0.95 m/s AV Vmax:        158.00 cm/s  PV Peak grad:  3.6 mmHg AV Peak Grad:   10.0 mmHg LVOT Vmax:      78.30 cm/s LVOT Vmean:     55.500 cm/s LVOT VTI:       0.139 m  AORTA Ao Root diam: 3.80 cm MITRAL VALVE               TRICUSPID VALVE MV Area (PHT): 4.68 cm    TV Peak grad:   27.8 mmHg MV Decel Time: 162 msec    TV Vmax:        2.64 m/s MV E velocity: 84.80 cm/s  TR Peak grad:   33.2 mmHg MV A velocity: 85.70 cm/s  TR Vmax:        288.00 cm/s MV E/A ratio:  0.99                            SHUNTS                            Systemic VTI:  0.14 m                            Systemic Diam: 2.00 cm Kathlyn Sacramento MD Electronically signed by Kathlyn Sacramento MD Signature Date/Time: 07/30/2021/9:45:15 AM    Final    DG Chest Port 1 View  Result Date: 07/27/2021 CLINICAL DATA:  Chest pain and shortness of breath EXAM: PORTABLE CHEST 1 VIEW COMPARISON:  Chest radiograph 1 day prior FINDINGS: The left-sided vascular catheter is stable terminating in the right atrium. The cardiomediastinal silhouette is stable. Extensive opacities are seen throughout the right lung which have significantly worsened since the study from 1 day prior. Aeration of the left lung is not significantly changed. There is no significant  pleural effusion. There is no pneumothorax. The bones are stable. IMPRESSION: New extensive opacities throughout the right lung may reflect new asymmetric alveolar edema, infection, or combination thereof. Electronically Signed   By: Valetta Mole M.D.   On: 07/27/2021 12:06   DG Chest Port 1 View  Result Date: 07/26/2021 CLINICAL DATA:  Shortness of breath EXAM: PORTABLE CHEST 1 VIEW COMPARISON:  07/08/2021 FINDINGS: Increased interstitial markings, chronic, although mildly progressive, particularly in the right lower lobe. This appearance favors atypical infection  over interstitial edema. No definite pleural effusions. No pneumothorax. The heart is top-normal in size.  Thoracic aortic atherosclerosis. Left IJ dual lumen dialysis catheter. IMPRESSION: Increased interstitial markings, mildly progressive, favoring atypical infection over interstitial edema. Electronically Signed   By: Julian Hy M.D.   On: 07/26/2021 22:17   CT HEAD WO CONTRAST (5MM)  Result Date: 07/08/2021 CLINICAL DATA:  Headache, new or worsening (Age >= 50y). c/o headache for the past week relieved by excedrin and now c/o sob. Also states her BP has been elevated and has been taking her meds as prescribed" EXAM: CT HEAD WITHOUT CONTRAST TECHNIQUE: Contiguous axial images were obtained from the base of the skull through the vertex without intravenous contrast. RADIATION DOSE REDUCTION: This exam was performed according to the departmental dose-optimization program which includes automated exposure control, adjustment of the mA and/or kV according to patient size and/or use of iterative reconstruction technique. COMPARISON:  CT head 12/31/2015, MRI head 01/01/2016 FINDINGS: Brain: No evidence of large-territorial acute infarction. No parenchymal hemorrhage. Interval increase in size of a 2.7 x 2.2 x 1.7 cm (from 1.4 x 1.5 cm) midline posterior fossa mass arising along the right tentorium. Mass is again noted to be partially calcified. No extra-axial collection. Associated mass effect on the vermis and bilateral cerebellar hemispheres. No hydrocephalus. Basilar cisterns are patent. Vascular: No hyperdense vessel. Skull: No acute fracture or focal lesion. Sinuses/Orbits: Paranasal sinuses and mastoid air cells are clear. The orbits are unremarkable. Other: None. IMPRESSION: Interval increase in size of a 2.7 x 2.2 cm (from 1.4 x 1.5 cm) midline posterior fossa meningioma arising along the right tentorium. Associated mass effect on the vermis and bilateral cerebellar hemispheres. Electronically Signed    By: Iven Finn M.D.   On: 07/08/2021 15:06   DG Chest Portable 1 View  Result Date: 07/08/2021 CLINICAL DATA:  [facility] reports pt coming from home c/o headache for the past week relieved by excedrin and now c/o sob. Also states her BP has been elevated and has been taking her meds as prescribed EXAM: PORTABLE CHEST 1 VIEW COMPARISON:  10/25/2020 and older studies. FINDINGS: Cardiac silhouette is normal in size. No mediastinal or hilar masses. Tunneled left anterior chest wall, internal jugular, central venous catheter is stable. Bilateral interstitial thickening has increased compared to the prior exam. There is mild intervening hazy airspace opacities. No visualized pleural effusion.  No pneumothorax. Skeletal structures are grossly intact. IMPRESSION: 1. Interstitial thickening and mild hazy airspace opacities consistent with pulmonary edema. Electronically Signed   By: Lajean Manes M.D.   On: 07/08/2021 14:49    Microbiology: Results for orders placed or performed during the hospital encounter of 07/26/21  SARS Coronavirus 2 by RT PCR (hospital order, performed in [facility] hospital lab) *cepheid single result test* Anterior Nasal Swab     Status: None   Collection Time: 07/26/21 10:36 PM   Specimen: Anterior Nasal Swab  Result Value Ref Range Status   SARS Coronavirus 2 by  RT PCR NEGATIVE NEGATIVE Final    Comment: (NOTE) SARS-CoV-2 target nucleic acids are NOT DETECTED.  The SARS-CoV-2 RNA is generally detectable in upper and lower respiratory specimens during the acute phase of infection. The lowest concentration of SARS-CoV-2 viral copies this assay can detect is 250 copies / mL. A negative result does not preclude SARS-CoV-2 infection and should not be used as the sole basis for treatment or other patient management decisions.  A negative result may occur with improper specimen collection / handling, submission of specimen other than nasopharyngeal swab, presence of viral  mutation(s) within the areas targeted by this assay, and inadequate number of viral copies (<250 copies / mL). A negative result must be combined with clinical observations, patient history, and epidemiological information.  Fact Sheet for Patients:   https://www.patel.info/  Fact Sheet for Healthcare Providers: https://hall.com/  This test is not yet approved or  cleared by the [place] FDA and has been authorized for detection and/or diagnosis of SARS-CoV-2 by FDA under an Emergency Use Authorization (EUA).  This EUA will remain in effect (meaning this test can be used) for the duration of the COVID-19 declaration under Section 564(b)(1) of the Act, 21 U.S.C. section 360bbb-3(b)(1), unless the authorization is terminated or revoked sooner.  Performed at [facility], [place], [place] [address]   Respiratory (~20 pathogens) panel by PCR     Status: None   Collection Time: 07/27/21  8:42 AM   Specimen: Nasopharyngeal Swab; Respiratory  Result Value Ref Range Status   Adenovirus NOT DETECTED NOT DETECTED Final   Coronavirus 229E NOT DETECTED NOT DETECTED Final    Comment: (NOTE) The Coronavirus on the Respiratory Panel, DOES NOT test for the novel  Coronavirus (2019 nCoV)    Coronavirus HKU1 NOT DETECTED NOT DETECTED Final   Coronavirus NL63 NOT DETECTED NOT DETECTED Final   Coronavirus OC43 NOT DETECTED NOT DETECTED Final   Metapneumovirus NOT DETECTED NOT DETECTED Final   Rhinovirus / Enterovirus NOT DETECTED NOT DETECTED Final   Influenza A NOT DETECTED NOT DETECTED Final   Influenza B NOT DETECTED NOT DETECTED Final   Parainfluenza Virus 1 NOT DETECTED NOT DETECTED Final   Parainfluenza Virus 2 NOT DETECTED NOT DETECTED Final   Parainfluenza Virus 3 NOT DETECTED NOT DETECTED Final   Parainfluenza Virus 4 NOT DETECTED NOT DETECTED Final   Respiratory Syncytial Virus NOT DETECTED NOT DETECTED Final    Bordetella pertussis NOT DETECTED NOT DETECTED Final   Bordetella Parapertussis NOT DETECTED NOT DETECTED Final   Chlamydophila pneumoniae NOT DETECTED NOT DETECTED Final   Mycoplasma pneumoniae NOT DETECTED NOT DETECTED Final    Comment: Performed at [facility], [place]. [address], [place], [place] [address]    Labs: CBC: Recent Labs  Lab 07/26/21 2218 07/27/21 0454 07/28/21 0514 07/30/21 0641  WBC 6.9 6.5 7.4 6.7  NEUTROABS 5.2  --   --   --   HGB 9.6* 8.6* 8.2* 8.3*  HCT 29.9* 27.1* 24.9* 25.2*  MCV 86.7 86.9 84.4 85.7  PLT 126* 161 158 941   Basic Metabolic Panel: Recent Labs  Lab 07/26/21 2218 07/28/21 0514 07/30/21 0641  NA 142 137 136  K 3.6 3.6 4.2  CL 106 102 96*  CO2 '27 28 27  '$ GLUCOSE 102* 90 93  BUN 36* 18 46*  CREATININE 8.32* 5.05* 10.20*  CALCIUM 10.5* 9.4 10.1  MG  --  1.8  --   PHOS  --  4.1  --  Liver Function Tests: Recent Labs  Lab 07/28/21 0514  ALBUMIN 3.0*   CBG: Recent Labs  Lab 07/27/21 1207  GLUCAP 182*    Discharge time spent: greater than 30 minutes.  Signed: Loletha Grayer, MD [facility] 07/30/2021

## 2021-07-30 NOTE — Assessment & Plan Note (Addendum)
Overnight pulse oximetry shows desaturations into the 70s and 80s.  The patient does qualify for nocturnal oxygen 2 L nasal cannula at night.  Recommend an outpatient sleep study.

## 2021-07-30 NOTE — Progress Notes (Signed)
Hd tx of 2.45hrs completed. 57.7L total blood vol processed. No complications noted. Report given to ina haka, rn.  Total uf removed: 1066m Post hd weight: 57kg Post hd v/s: 97.8 128/82(96) 18 76 100%

## 2021-07-30 NOTE — Progress Notes (Signed)
[facility]  ROUNDING NOTE   Subjective:   Teresa Cook 66 year old African-American female with past medical conditions including hypertension, gout, anxiety with depression, sickle cell trait, systolic heart failure with a EF 40 to 45%, breast cancer with bilateral mastectomy, seizures, and end-stage renal disease on hemodialysis.  Patient presents to the emergency department with complaints of shortness of breath.  Patient has been admitted for Acute respiratory failure with hypoxia ([place]) [J96.01] Troponin I above reference range [R77.8] Fluid overload [E87.70] Hypervolemia, unspecified hypervolemia type [E87.70]  Patient is known to our practice and receives outpatient dialysis treatments at [facility] on a MWF schedule, supervised by Dr. Candiss Norse.    Update:  Patient seen and evaluated during dialysis   HEMODIALYSIS FLOWSHEET:  Blood Flow Rate (mL/min): 200 mL/min Arterial Pressure (mmHg): -180 mmHg Venous Pressure (mmHg): 230 mmHg TMP (mmHg): 9 mmHg Ultrafiltration Rate (mL/min): 545 mL/min Dialysate Flow Rate (mL/min): 300 ml/min Dialysis Fluid Bolus: Normal Saline Bolus Amount (mL): 300 mL  States she feels well today, is on 2 L nasal cannula Denies chest pain or abdominal discomfort Patient states that she has poor appetite intermittently  Objective:  Vital signs in last 24 hours:  Temp:  [97.8 F (36.6 C)-99.3 F (37.4 C)] 97.8 F (36.6 C) (07/24 1154) Pulse Rate:  [66-99] 80 (07/24 1154) Resp:  [15-28] 19 (07/24 1154) BP: (128-152)/(78-117) 128/82 (07/24 1154) SpO2:  [90 %-100 %] 100 % (07/24 1154) Weight:  [58 kg-58.1 kg] 58 kg (07/24 0800)  Weight change: 4.306 kg Filed Weights   07/29/21 0405 07/30/21 0459 07/30/21 0800  Weight: 55.2 kg 58.1 kg 58 kg    Intake/Output: I/O last 3 completed shifts: In: 120 [P.O.:120] Out: -    Intake/Output this shift:  Total I/O In: -  Out: 1000 [Other:1000]  Physical Exam: General:  NAD  Head: Normocephalic, atraumatic. Moist oral mucosal membranes  Eyes: Anicteric  Lungs:  Diminished breath sounds, normal effort, 2 L [place]  Heart: Regular rate and rhythm  Abdomen:  Soft, mild tenderness.  Extremities: Trace peripheral edema.  Neurologic: Nonfocal, moving all four extremities  Skin: No lesions  Access: Left PermCath    Basic Metabolic Panel: Recent Labs  Lab 07/26/21 2218 07/28/21 0514 07/30/21 0641  NA 142 137 136  K 3.6 3.6 4.2  CL 106 102 96*  CO2 '27 28 27  '$ GLUCOSE 102* 90 93  BUN 36* 18 46*  CREATININE 8.32* 5.05* 10.20*  CALCIUM 10.5* 9.4 10.1  MG  --  1.8  --   PHOS  --  4.1  --      Liver Function Tests: Recent Labs  Lab 07/28/21 0514  ALBUMIN 3.0*    No results for input(s): "LIPASE", "AMYLASE" in the last 168 hours. No results for input(s): "AMMONIA" in the last 168 hours.  CBC: Recent Labs  Lab 07/26/21 2218 07/27/21 0454 07/28/21 0514 07/30/21 0641  WBC 6.9 6.5 7.4 6.7  NEUTROABS 5.2  --   --   --   HGB 9.6* 8.6* 8.2* 8.3*  HCT 29.9* 27.1* 24.9* 25.2*  MCV 86.7 86.9 84.4 85.7  PLT 126* 161 158 176     Cardiac Enzymes: No results for input(s): "CKTOTAL", "CKMB", "CKMBINDEX", "TROPONINI" in the last 168 hours.  BNP: Invalid input(s): "POCBNP"  CBG: Recent Labs  Lab 07/27/21 1207  [place]*     Microbiology: Results for orders placed or performed during the hospital encounter of 07/26/21  SARS Coronavirus 2 by RT PCR (hospital  order, performed in [facility] hospital lab) *cepheid single result test* Anterior Nasal Swab     Status: None   Collection Time: 07/26/21 10:36 PM   Specimen: Anterior Nasal Swab  Result Value Ref Range Status   SARS Coronavirus 2 by RT PCR NEGATIVE NEGATIVE Final    Comment: (NOTE) SARS-CoV-2 target nucleic acids are NOT DETECTED.  The SARS-CoV-2 RNA is generally detectable in upper and lower respiratory specimens during the acute phase of infection. The lowest concentration of  SARS-CoV-2 viral copies this assay can detect is 250 copies / mL. A negative result does not preclude SARS-CoV-2 infection and should not be used as the sole basis for treatment or other patient management decisions.  A negative result may occur with improper specimen collection / handling, submission of specimen other than nasopharyngeal swab, presence of viral mutation(s) within the areas targeted by this assay, and inadequate number of viral copies (<250 copies / mL). A negative result must be combined with clinical observations, patient history, and epidemiological information.  Fact Sheet for Patients:   https://www.patel.info/  Fact Sheet for Healthcare Providers: https://hall.com/  This test is not yet approved or  cleared by the [place] FDA and has been authorized for detection and/or diagnosis of SARS-CoV-2 by FDA under an Emergency Use Authorization (EUA).  This EUA will remain in effect (meaning this test can be used) for the duration of the COVID-19 declaration under Section 564(b)(1) of the Act, 21 U.S.C. section 360bbb-3(b)(1), unless the authorization is terminated or revoked sooner.  Performed at [facility], [place], [place] [address]   Respiratory (~20 pathogens) panel by PCR     Status: None   Collection Time: 07/27/21  8:42 AM   Specimen: Nasopharyngeal Swab; Respiratory  Result Value Ref Range Status   Adenovirus NOT DETECTED NOT DETECTED Final   Coronavirus 229E NOT DETECTED NOT DETECTED Final    Comment: (NOTE) The Coronavirus on the Respiratory Panel, DOES NOT test for the novel  Coronavirus (2019 nCoV)    Coronavirus HKU1 NOT DETECTED NOT DETECTED Final   Coronavirus NL63 NOT DETECTED NOT DETECTED Final   Coronavirus OC43 NOT DETECTED NOT DETECTED Final   Metapneumovirus NOT DETECTED NOT DETECTED Final   Rhinovirus / Enterovirus NOT DETECTED NOT DETECTED Final   Influenza A  NOT DETECTED NOT DETECTED Final   Influenza B NOT DETECTED NOT DETECTED Final   Parainfluenza Virus 1 NOT DETECTED NOT DETECTED Final   Parainfluenza Virus 2 NOT DETECTED NOT DETECTED Final   Parainfluenza Virus 3 NOT DETECTED NOT DETECTED Final   Parainfluenza Virus 4 NOT DETECTED NOT DETECTED Final   Respiratory Syncytial Virus NOT DETECTED NOT DETECTED Final   Bordetella pertussis NOT DETECTED NOT DETECTED Final   Bordetella Parapertussis NOT DETECTED NOT DETECTED Final   Chlamydophila pneumoniae NOT DETECTED NOT DETECTED Final   Mycoplasma pneumoniae NOT DETECTED NOT DETECTED Final    Comment: Performed at [facility], [place]. [address], [place], [place] [address]    Coagulation Studies: Recent Labs    07/28/21 0514 07/29/21 0643 07/30/21 0641  LABPROT 29.4* 30.0* 28.2*  INR 2.8* 2.9* 2.7*     Urinalysis: No results for input(s): "COLORURINE", "LABSPEC", "PHURINE", "GLUCOSEU", "HGBUR", "BILIRUBINUR", "KETONESUR", "PROTEINUR", "UROBILINOGEN", "NITRITE", "LEUKOCYTESUR" in the last 72 hours.  Invalid input(s): "APPERANCEUR"    Imaging: ECHOCARDIOGRAM COMPLETE  Result Date: 07/30/2021    ECHOCARDIOGRAM REPORT   Patient Name:   ENNIS DELPOZO Date of Exam: 07/29/2021 Medical Rec #:  [id number]       Height:       65.0 in Accession #:    [id number]      Weight:       121.7 lb Date of Birth:  03/28/1955       BSA:          1.602 m Patient Age:    80 years        BP:           138/102 mmHg Patient Gender: F               HR:           80 bpm. Exam Location:  [facility] Procedure: 2D Echo Indications:     CHF I50.21  History:         Patient has prior history of Echocardiogram examinations, most                  recent 01/23/2020.  Sonographer:     Kathlen Brunswick RDCS Referring Phys:  [id number] Loletha Grayer Diagnosing Phys: Kathlyn Sacramento MD IMPRESSIONS  1. Left ventricular ejection fraction, by estimation, is 50 to 55%. The left ventricle has low normal function. Left ventricular  endocardial border not optimally defined to evaluate regional wall motion. There is moderate concentric left ventricular hypertrophy. Left ventricular diastolic parameters are consistent with Grade II diastolic dysfunction (pseudonormalization).  2. Right ventricular systolic function is normal. The right ventricular size is normal. There is mildly elevated pulmonary artery systolic pressure.  3. Left atrial size was mildly dilated.  4. The mitral valve is normal in structure. Mild to moderate mitral valve regurgitation. No evidence of mitral stenosis.  5. The aortic valve is calcified. There is severe calcifcation of the aortic valve. Aortic valve regurgitation is trivial. Aortic valve sclerosis/calcification is present, without any evidence of aortic stenosis.  6. The inferior vena cava is normal in size with <50% respiratory variability, suggesting right atrial pressure of 8 mmHg. FINDINGS  Left Ventricle: Left ventricular ejection fraction, by estimation, is 50 to 55%. The left ventricle has low normal function. Left ventricular endocardial border not optimally defined to evaluate regional wall motion. The left ventricular internal cavity  size was normal in size. There is moderate concentric left ventricular hypertrophy. Left ventricular diastolic parameters are consistent with Grade II diastolic dysfunction (pseudonormalization). Right Ventricle: The right ventricular size is normal. No increase in right ventricular wall thickness. Right ventricular systolic function is normal. There is mildly elevated pulmonary artery systolic pressure. The tricuspid regurgitant velocity is 2.88  m/s, and with an assumed right atrial pressure of 8 mmHg, the estimated right ventricular systolic pressure is 17.5 mmHg. Left Atrium: Left atrial size was mildly dilated. Right Atrium: Right atrial size was normal in size. Pericardium: There is no evidence of pericardial effusion. Mitral Valve: The mitral valve is normal in  structure. There is mild thickening of the mitral valve leaflet(s). There is mild calcification of the mitral valve leaflet(s). Mild mitral annular calcification. Mild to moderate mitral valve regurgitation. No  evidence of mitral valve stenosis. Tricuspid Valve: The tricuspid valve is normal in structure. Tricuspid valve regurgitation is mild . No evidence of tricuspid stenosis. Aortic Valve: The aortic valve is calcified. There is severe calcifcation of the aortic valve. Aortic valve regurgitation is trivial. Aortic valve sclerosis/calcification is present, without any evidence of aortic stenosis. Aortic valve peak gradient measures 10.0 mmHg. Pulmonic Valve: The pulmonic valve was normal in structure. Pulmonic valve regurgitation  is not visualized. No evidence of pulmonic stenosis. Aorta: The aortic root is normal in size and structure. Venous: The inferior vena cava is normal in size with less than 50% respiratory variability, suggesting right atrial pressure of 8 mmHg. IAS/Shunts: No atrial level shunt detected by color flow Doppler.  LEFT VENTRICLE PLAX 2D LVIDd:         3.36 cm      Diastology LVIDs:         2.38 cm      LV e' medial:    3.92 cm/s LV PW:         1.44 cm      LV E/e' medial:  21.6 LV IVS:        1.53 cm      LV e' lateral:   4.79 cm/s LVOT diam:     2.00 cm      LV E/e' lateral: 17.7 LV SV:         44 LV SV Index:   27 LVOT Area:     3.14 cm  LV Volumes (MOD) LV vol d, MOD A2C: 93.0 ml LV vol d, MOD A4C: 107.0 ml LV vol s, MOD A2C: 40.7 ml LV vol s, MOD A4C: 46.4 ml LV SV MOD A2C:     52.3 ml LV SV MOD A4C:     107.0 ml LV SV MOD BP:      56.0 ml RIGHT VENTRICLE RV Basal diam:  2.70 cm TAPSE (M-mode): 1.9 cm LEFT ATRIUM             Index        RIGHT ATRIUM           Index LA diam:        3.50 cm 2.19 cm/m   RA Area:     10.60 cm LA Vol (A2C):   53.7 ml 33.53 ml/m  RA Volume:   21.50 ml  13.42 ml/m LA Vol (A4C):   53.5 ml 33.40 ml/m LA Biplane Vol: 56.9 ml 35.53 ml/m  AORTIC VALVE                  PULMONIC VALVE AV Area (Vmax): 1.56 cm     PV Vmax:       0.95 m/s AV Vmax:        158.00 cm/s  PV Peak grad:  3.6 mmHg AV Peak Grad:   10.0 mmHg LVOT Vmax:      78.30 cm/s LVOT Vmean:     55.500 cm/s LVOT VTI:       0.139 m  AORTA Ao Root diam: 3.80 cm MITRAL VALVE               TRICUSPID VALVE MV Area (PHT): 4.68 cm    TV Peak grad:   27.8 mmHg MV Decel Time: 162 msec    TV Vmax:        2.64 m/s MV E velocity: 84.80 cm/s  TR Peak grad:   33.2 mmHg MV A velocity: 85.70 cm/s  TR Vmax:        288.00 cm/s MV E/A ratio:  0.99                            SHUNTS                            Systemic VTI:  0.14 m  Systemic Diam: 2.00 cm Kathlyn Sacramento MD Electronically signed by Kathlyn Sacramento MD Signature Date/Time: 07/30/2021/9:45:15 AM    Final      Medications:      aspirin EC  81 mg Oral Daily   atorvastatin  40 mg Oral Daily   calcium carbonate  1 tablet Oral BID   calcium carbonate  2 tablet Oral Once   Chlorhexidine Gluconate Cloth  6 each Topical Q0600   cinacalcet  60 mg Oral QAC lunch   hydrALAZINE  25 mg Oral TID   isosorbide mononitrate  30 mg Oral Daily   megestrol  200 mg Oral Daily   melatonin  5 mg Oral QHS   metoprolol succinate  100 mg Oral Daily   pantoprazole  40 mg Oral BID   sacubitril-valsartan  1 tablet Oral BID   sertraline  150 mg Oral Daily   sevelamer carbonate  2,400 mg Oral TID with meals   traZODone  50 mg Oral QHS   warfarin  5 mg Oral ONCE-1600   Warfarin - Pharmacist Dosing Inpatient   Does not apply q1600   acetaminophen **OR** acetaminophen, acetaminophen-caffeine, albuterol, ALPRAZolam, guaiFENesin-dextromethorphan, hydrALAZINE, nitroGLYCERIN, ondansetron **OR** ondansetron (ZOFRAN) IV, sevelamer carbonate, zolpidem  Assessment/ Plan:  Ms. Ameisha Mcclellan is a 66 y.o.  female with past medical conditions including hypertension, gout, anxiety with depression, sickle cell trait, systolic heart failure with a EF 40 to 45%,  breast cancer with bilateral mastectomy, seizures, and end-stage renal disease on hemodialysis.  Patient presents to the emergency department with complaints of shortness of breath.  Patient has been admitted for Acute respiratory failure with hypoxia ([place]) [J96.01] Troponin I above reference range [R77.8] Fluid overload [E87.70] Hypervolemia, unspecified hypervolemia type [E87.70]   [facility] [place]/MWF/Lt Permcath/55kg  End-stage renal disease on hemodialysis.  Will maintain outpatient schedule if possible.  Hepatitis B panel ordered as hospital protocol dictates.  Patient received dialysis this morning, UF goal 1 L achieved.  Next treatment scheduled for Wednesday  Will trial patient on Megace for appetite stimulation.  May consider Remeron if this medication is not optimal.  2.  Acute respiratory failure with hypoxia.  Chest x-ray ordered shows new extensive opacities throughout the right lung concerning for alveolar edema, infection or combination of both.    Remains on 2 L nasal cannula.  3.  Acute on chronic systolic heart failure.  Echo completed on 01/23/2020 shows EF 40 to 45% with a moderate LVH.  Patient has had multiple emergency department visits for shortness of breath and pulmonary edema.   Echo completed on 07/29/2021 shows EF 50 to 55% with a moderate LVH and grade 2 diastolic dysfunction.  Aortic valve appears calcified  4. Anemia of chronic kidney disease Lab Results  Component Value Date   HGB 8.3 (L) 07/30/2021  Hemoglobin remains below target. Patient receives [facility] outpatient.    5. Secondary Hyperparathyroidism: Lab Results  Component Value Date   PTH 214 (H) 07/08/2021   CALCIUM 10.1 07/30/2021   PHOS 4.1 07/28/2021  Calcium and phosphorus remain within acceptable range. Continue calcium carbonate and sevelamer.    LOS: 4 Tiena Manansala 7/24/202312:49 PM

## 2021-08-02 LAB — METANEPHRINES, PLASMA
Metanephrine, Free: 29.5 pg/mL (ref 0.0–88.0)
Normetanephrine, Free: 115.4 pg/mL (ref 0.0–285.2)

## 2021-09-26 ENCOUNTER — Other Ambulatory Visit
Admission: RE | Admit: 2021-09-26 | Discharge: 2021-09-26 | Disposition: A | Discharge status: Home / Self Care | Payer: Medicare Other | Source: Ambulatory Visit | Attending: Internal Medicine | Admitting: Internal Medicine

## 2021-09-26 DIAGNOSIS — I4891 Unspecified atrial fibrillation: Secondary | ICD-10-CM | POA: Diagnosis present

## 2021-09-26 LAB — PROTIME-INR
INR: 5.6 (ref 0.8–1.2)
Prothrombin Time: 50.1 seconds — ABNORMAL HIGH (ref 11.4–15.2)

## 2021-09-28 ENCOUNTER — Other Ambulatory Visit
Admission: RE | Admit: 2021-09-28 | Discharge: 2021-09-28 | Disposition: A | Discharge status: Home / Self Care | Payer: Medicare Other | Source: Ambulatory Visit | Attending: Internal Medicine | Admitting: Internal Medicine

## 2021-09-28 DIAGNOSIS — I4891 Unspecified atrial fibrillation: Secondary | ICD-10-CM | POA: Diagnosis present

## 2021-09-28 LAB — PROTIME-INR
INR: >10 (ref 0.8–1.2)
Prothrombin Time: >90 seconds — ABNORMAL HIGH (ref 11.4–15.2)

## 2021-10-01 ENCOUNTER — Other Ambulatory Visit
Admission: RE | Admit: 2021-10-01 | Discharge: 2021-10-01 | Disposition: A | Discharge status: Home / Self Care | Payer: Medicare Other | Source: Ambulatory Visit | Attending: Internal Medicine | Admitting: Internal Medicine

## 2021-10-01 DIAGNOSIS — I4891 Unspecified atrial fibrillation: Secondary | ICD-10-CM | POA: Insufficient documentation

## 2021-10-01 LAB — PROTIME-INR
INR: 2.1 — ABNORMAL HIGH (ref 0.8–1.2)
Prothrombin Time: 23.7 seconds — ABNORMAL HIGH (ref 11.4–15.2)

## 2021-11-05 ENCOUNTER — Emergency Department: Payer: Medicare Other

## 2021-11-05 ENCOUNTER — Encounter
Admission: EM | Disposition: A | Discharge status: Home / Self Care | Payer: Self-pay | Source: Home / Self Care | Attending: Internal Medicine

## 2021-11-05 ENCOUNTER — Other Ambulatory Visit: Payer: Self-pay

## 2021-11-05 ENCOUNTER — Inpatient Hospital Stay
Admission: EM | Admit: 2021-11-05 | Discharge: 2021-11-22 | DRG: 673 | Disposition: A | Discharge status: Home / Self Care | Payer: Medicare Other | Attending: Internal Medicine | Admitting: Internal Medicine

## 2021-11-05 DIAGNOSIS — I340 Nonrheumatic mitral (valve) insufficiency: Secondary | ICD-10-CM | POA: Diagnosis present

## 2021-11-05 DIAGNOSIS — I472 Ventricular tachycardia, unspecified: Secondary | ICD-10-CM | POA: Diagnosis not present

## 2021-11-05 DIAGNOSIS — R58 Hemorrhage, not elsewhere classified: Secondary | ICD-10-CM

## 2021-11-05 DIAGNOSIS — Z888 Allergy status to other drugs, medicaments and biological substances status: Secondary | ICD-10-CM

## 2021-11-05 DIAGNOSIS — M109 Gout, unspecified: Secondary | ICD-10-CM | POA: Diagnosis present

## 2021-11-05 DIAGNOSIS — I9589 Other hypotension: Secondary | ICD-10-CM

## 2021-11-05 DIAGNOSIS — R109 Unspecified abdominal pain: Secondary | ICD-10-CM

## 2021-11-05 DIAGNOSIS — G40909 Epilepsy, unspecified, not intractable, without status epilepticus: Secondary | ICD-10-CM | POA: Diagnosis present

## 2021-11-05 DIAGNOSIS — Y92009 Unspecified place in unspecified non-institutional (private) residence as the place of occurrence of the external cause: Secondary | ICD-10-CM

## 2021-11-05 DIAGNOSIS — J9601 Acute respiratory failure with hypoxia: Secondary | ICD-10-CM | POA: Diagnosis present

## 2021-11-05 DIAGNOSIS — I4891 Unspecified atrial fibrillation: Secondary | ICD-10-CM | POA: Diagnosis not present

## 2021-11-05 DIAGNOSIS — I959 Hypotension, unspecified: Secondary | ICD-10-CM | POA: Diagnosis not present

## 2021-11-05 DIAGNOSIS — Z823 Family history of stroke: Secondary | ICD-10-CM

## 2021-11-05 DIAGNOSIS — Z853 Personal history of malignant neoplasm of breast: Secondary | ICD-10-CM

## 2021-11-05 DIAGNOSIS — R571 Hypovolemic shock: Secondary | ICD-10-CM | POA: Diagnosis present

## 2021-11-05 DIAGNOSIS — N2581 Secondary hyperparathyroidism of renal origin: Secondary | ICD-10-CM | POA: Diagnosis present

## 2021-11-05 DIAGNOSIS — I480 Paroxysmal atrial fibrillation: Secondary | ICD-10-CM | POA: Diagnosis not present

## 2021-11-05 DIAGNOSIS — Z515 Encounter for palliative care: Secondary | ICD-10-CM | POA: Diagnosis not present

## 2021-11-05 DIAGNOSIS — E875 Hyperkalemia: Secondary | ICD-10-CM | POA: Diagnosis not present

## 2021-11-05 DIAGNOSIS — R739 Hyperglycemia, unspecified: Secondary | ICD-10-CM | POA: Diagnosis present

## 2021-11-05 DIAGNOSIS — S37021A Major contusion of right kidney, initial encounter: Principal | ICD-10-CM | POA: Diagnosis present

## 2021-11-05 DIAGNOSIS — R296 Repeated falls: Secondary | ICD-10-CM | POA: Diagnosis present

## 2021-11-05 DIAGNOSIS — D696 Thrombocytopenia, unspecified: Secondary | ICD-10-CM | POA: Diagnosis not present

## 2021-11-05 DIAGNOSIS — D62 Acute posthemorrhagic anemia: Secondary | ICD-10-CM

## 2021-11-05 DIAGNOSIS — D6832 Hemorrhagic disorder due to extrinsic circulating anticoagulants: Secondary | ICD-10-CM | POA: Diagnosis present

## 2021-11-05 DIAGNOSIS — Z8249 Family history of ischemic heart disease and other diseases of the circulatory system: Secondary | ICD-10-CM

## 2021-11-05 DIAGNOSIS — W19XXXA Unspecified fall, initial encounter: Secondary | ICD-10-CM | POA: Diagnosis present

## 2021-11-05 DIAGNOSIS — K219 Gastro-esophageal reflux disease without esophagitis: Secondary | ICD-10-CM | POA: Diagnosis present

## 2021-11-05 DIAGNOSIS — I509 Heart failure, unspecified: Secondary | ICD-10-CM | POA: Diagnosis not present

## 2021-11-05 DIAGNOSIS — Z7901 Long term (current) use of anticoagulants: Secondary | ICD-10-CM

## 2021-11-05 DIAGNOSIS — I953 Hypotension of hemodialysis: Secondary | ICD-10-CM | POA: Diagnosis not present

## 2021-11-05 DIAGNOSIS — A419 Sepsis, unspecified organism: Secondary | ICD-10-CM | POA: Diagnosis present

## 2021-11-05 DIAGNOSIS — K661 Hemoperitoneum: Secondary | ICD-10-CM | POA: Diagnosis present

## 2021-11-05 DIAGNOSIS — I700 Atherosclerosis of aorta: Secondary | ICD-10-CM | POA: Diagnosis present

## 2021-11-05 DIAGNOSIS — I5042 Chronic combined systolic (congestive) and diastolic (congestive) heart failure: Secondary | ICD-10-CM | POA: Diagnosis present

## 2021-11-05 DIAGNOSIS — R6521 Severe sepsis with septic shock: Secondary | ICD-10-CM | POA: Diagnosis not present

## 2021-11-05 DIAGNOSIS — R4182 Altered mental status, unspecified: Secondary | ICD-10-CM

## 2021-11-05 DIAGNOSIS — Z1152 Encounter for screening for COVID-19: Secondary | ICD-10-CM | POA: Diagnosis not present

## 2021-11-05 DIAGNOSIS — R7989 Other specified abnormal findings of blood chemistry: Secondary | ICD-10-CM

## 2021-11-05 DIAGNOSIS — I4719 Other supraventricular tachycardia: Secondary | ICD-10-CM | POA: Diagnosis not present

## 2021-11-05 DIAGNOSIS — I10 Essential (primary) hypertension: Secondary | ICD-10-CM | POA: Diagnosis present

## 2021-11-05 DIAGNOSIS — Z992 Dependence on renal dialysis: Secondary | ICD-10-CM | POA: Diagnosis not present

## 2021-11-05 DIAGNOSIS — K567 Ileus, unspecified: Secondary | ICD-10-CM | POA: Diagnosis not present

## 2021-11-05 DIAGNOSIS — Z66 Do not resuscitate: Secondary | ICD-10-CM | POA: Diagnosis not present

## 2021-11-05 DIAGNOSIS — D320 Benign neoplasm of cerebral meninges: Secondary | ICD-10-CM | POA: Diagnosis present

## 2021-11-05 DIAGNOSIS — N2889 Other specified disorders of kidney and ureter: Secondary | ICD-10-CM | POA: Diagnosis not present

## 2021-11-05 DIAGNOSIS — D689 Coagulation defect, unspecified: Secondary | ICD-10-CM

## 2021-11-05 DIAGNOSIS — D631 Anemia in chronic kidney disease: Secondary | ICD-10-CM | POA: Diagnosis present

## 2021-11-05 DIAGNOSIS — E162 Hypoglycemia, unspecified: Secondary | ICD-10-CM | POA: Diagnosis not present

## 2021-11-05 DIAGNOSIS — K683 Retroperitoneal hematoma: Secondary | ICD-10-CM | POA: Diagnosis present

## 2021-11-05 DIAGNOSIS — Z833 Family history of diabetes mellitus: Secondary | ICD-10-CM

## 2021-11-05 DIAGNOSIS — I132 Hypertensive heart and chronic kidney disease with heart failure and with stage 5 chronic kidney disease, or end stage renal disease: Secondary | ICD-10-CM | POA: Diagnosis present

## 2021-11-05 DIAGNOSIS — E861 Hypovolemia: Secondary | ICD-10-CM | POA: Diagnosis present

## 2021-11-05 DIAGNOSIS — Z90711 Acquired absence of uterus with remaining cervical stump: Secondary | ICD-10-CM

## 2021-11-05 DIAGNOSIS — R55 Syncope and collapse: Secondary | ICD-10-CM | POA: Diagnosis present

## 2021-11-05 DIAGNOSIS — R578 Other shock: Secondary | ICD-10-CM | POA: Diagnosis present

## 2021-11-05 DIAGNOSIS — T45515A Adverse effect of anticoagulants, initial encounter: Secondary | ICD-10-CM | POA: Diagnosis present

## 2021-11-05 DIAGNOSIS — S0083XA Contusion of other part of head, initial encounter: Secondary | ICD-10-CM | POA: Diagnosis present

## 2021-11-05 DIAGNOSIS — Z91048 Other nonmedicinal substance allergy status: Secondary | ICD-10-CM

## 2021-11-05 DIAGNOSIS — I708 Atherosclerosis of other arteries: Secondary | ICD-10-CM | POA: Diagnosis not present

## 2021-11-05 DIAGNOSIS — Z86718 Personal history of other venous thrombosis and embolism: Secondary | ICD-10-CM

## 2021-11-05 DIAGNOSIS — N186 End stage renal disease: Secondary | ICD-10-CM | POA: Diagnosis present

## 2021-11-05 DIAGNOSIS — F339 Major depressive disorder, recurrent, unspecified: Secondary | ICD-10-CM | POA: Diagnosis present

## 2021-11-05 DIAGNOSIS — R579 Shock, unspecified: Secondary | ICD-10-CM

## 2021-11-05 DIAGNOSIS — Z79899 Other long term (current) drug therapy: Secondary | ICD-10-CM

## 2021-11-05 DIAGNOSIS — N250 Renal osteodystrophy: Secondary | ICD-10-CM | POA: Diagnosis present

## 2021-11-05 DIAGNOSIS — Z9013 Acquired absence of bilateral breasts and nipples: Secondary | ICD-10-CM

## 2021-11-05 DIAGNOSIS — Z9181 History of falling: Secondary | ICD-10-CM

## 2021-11-05 DIAGNOSIS — F419 Anxiety disorder, unspecified: Secondary | ICD-10-CM | POA: Diagnosis present

## 2021-11-05 DIAGNOSIS — Z803 Family history of malignant neoplasm of breast: Secondary | ICD-10-CM

## 2021-11-05 DIAGNOSIS — Z7189 Other specified counseling: Secondary | ICD-10-CM | POA: Diagnosis not present

## 2021-11-05 HISTORY — PX: RENAL ANGIOGRAPHY: CATH118260

## 2021-11-05 LAB — COMPREHENSIVE METABOLIC PANEL
ALT: 11 U/L (ref 0–44)
AST: 19 U/L (ref 15–41)
Albumin: 2.7 g/dL — ABNORMAL LOW (ref 3.5–5.0)
Alkaline Phosphatase: 58 U/L (ref 38–126)
Anion gap: 15 (ref 5–15)
BUN: 65 mg/dL — ABNORMAL HIGH (ref 8–23)
CO2: 19 mmol/L — ABNORMAL LOW (ref 22–32)
Calcium: 7.8 mg/dL — ABNORMAL LOW (ref 8.9–10.3)
Chloride: 107 mmol/L (ref 98–111)
Creatinine, Ser: 10.38 mg/dL — ABNORMAL HIGH (ref 0.44–1.00)
GFR, Estimated: 4 mL/min — ABNORMAL LOW (ref >60)
Glucose, Bld: 185 mg/dL — ABNORMAL HIGH (ref 70–99)
Potassium: 4.7 mmol/L (ref 3.5–5.1)
Sodium: 141 mmol/L (ref 135–145)
Total Bilirubin: 0.5 mg/dL (ref 0.3–1.2)
Total Protein: 5.1 g/dL — ABNORMAL LOW (ref 6.5–8.1)

## 2021-11-05 LAB — CBC WITH DIFFERENTIAL/PLATELET
Abs Immature Granulocytes: 0.08 10*3/uL — ABNORMAL HIGH (ref 0.00–0.07)
Basophils Absolute: 0 10*3/uL (ref 0.0–0.1)
Basophils Relative: 0 %
Eosinophils Absolute: 0.1 10*3/uL (ref 0.0–0.5)
Eosinophils Relative: 0 %
HCT: 25.1 % — ABNORMAL LOW (ref 36.0–46.0)
Hemoglobin: 8 g/dL — ABNORMAL LOW (ref 12.0–15.0)
Immature Granulocytes: 1 %
Lymphocytes Relative: 17 %
Lymphs Abs: 2.2 10*3/uL (ref 0.7–4.0)
MCH: 27.9 pg (ref 26.0–34.0)
MCHC: 31.9 g/dL (ref 30.0–36.0)
MCV: 87.5 fL (ref 80.0–100.0)
Monocytes Absolute: 1.1 10*3/uL — ABNORMAL HIGH (ref 0.1–1.0)
Monocytes Relative: 8 %
Neutro Abs: 9.5 10*3/uL — ABNORMAL HIGH (ref 1.7–7.7)
Neutrophils Relative %: 74 %
Platelets: 174 10*3/uL (ref 150–400)
RBC: 2.87 MIL/uL — ABNORMAL LOW (ref 3.87–5.11)
RDW: 17.6 % — ABNORMAL HIGH (ref 11.5–15.5)
WBC: 12.9 10*3/uL — ABNORMAL HIGH (ref 4.0–10.5)
nRBC: 0 % (ref 0.0–0.2)

## 2021-11-05 LAB — HEMOGLOBIN AND HEMATOCRIT, BLOOD
HCT: 17.9 % — ABNORMAL LOW (ref 36.0–46.0)
HCT: 19.6 % — ABNORMAL LOW (ref 36.0–46.0)
HCT: 25.4 % — ABNORMAL LOW (ref 36.0–46.0)
Hemoglobin: 5.8 g/dL — ABNORMAL LOW (ref 12.0–15.0)
Hemoglobin: 6.6 g/dL — ABNORMAL LOW (ref 12.0–15.0)
Hemoglobin: 8.4 g/dL — ABNORMAL LOW (ref 12.0–15.0)

## 2021-11-05 LAB — LACTIC ACID, PLASMA
Lactic Acid, Venous: 3.8 mmol/L (ref 0.5–1.9)
Lactic Acid, Venous: 2.6 mmol/L (ref 0.5–1.9)
Lactic Acid, Venous: 4.6 mmol/L (ref 0.5–1.9)
Lactic Acid, Venous: 1.5 mmol/L (ref 0.5–1.9)
Lactic Acid, Venous: 1.2 mmol/L (ref 0.5–1.9)

## 2021-11-05 LAB — T4, FREE: Free T4: 0.84 ng/dL (ref 0.61–1.12)

## 2021-11-05 LAB — GLUCOSE, CAPILLARY
Glucose-Capillary: 135 mg/dL — ABNORMAL HIGH (ref 70–99)
Glucose-Capillary: 88 mg/dL (ref 70–99)
Glucose-Capillary: 95 mg/dL (ref 70–99)

## 2021-11-05 LAB — TROPONIN I (HIGH SENSITIVITY)
Troponin I (High Sensitivity): 11 ng/L (ref <18)
Troponin I (High Sensitivity): 10 ng/L (ref <18)

## 2021-11-05 LAB — MRSA NEXT GEN BY PCR, NASAL: MRSA by PCR Next Gen: NOT DETECTED

## 2021-11-05 LAB — PROTIME-INR
INR: 9.2 (ref 0.8–1.2)
INR: 1.4 — ABNORMAL HIGH (ref 0.8–1.2)
Prothrombin Time: 74.3 seconds — ABNORMAL HIGH (ref 11.4–15.2)
Prothrombin Time: 17 seconds — ABNORMAL HIGH (ref 11.4–15.2)

## 2021-11-05 LAB — BRAIN NATRIURETIC PEPTIDE: B Natriuretic Peptide: 128.8 pg/mL — ABNORMAL HIGH (ref 0.0–100.0)

## 2021-11-05 LAB — TSH: TSH: 3.354 u[IU]/mL (ref 0.350–4.500)

## 2021-11-05 LAB — APTT: aPTT: 36 seconds (ref 24–36)

## 2021-11-05 LAB — SARS CORONAVIRUS 2 BY RT PCR: SARS Coronavirus 2 by RT PCR: NEGATIVE

## 2021-11-05 LAB — PREPARE RBC (CROSSMATCH)

## 2021-11-05 LAB — HEPATITIS B SURFACE ANTIGEN: Hepatitis B Surface Ag: NONREACTIVE

## 2021-11-05 SURGERY — RENAL ANGIOGRAPHY
Anesthesia: Moderate Sedation | Laterality: Right

## 2021-11-05 MED ORDER — ANTICOAGULANT SODIUM CITRATE 4% (200MG/5ML) IV SOLN: 5.0000 mL | Status: DC | PRN

## 2021-11-05 MED ORDER — MIDAZOLAM HCL 2 MG/2ML IJ SOLN
INTRAMUSCULAR | Status: AC
  Filled 2021-11-05: qty 2

## 2021-11-05 MED ORDER — LIDOCAINE HCL (PF) 1 % IJ SOLN: 5.0000 mL | INTRAMUSCULAR | Status: DC | PRN

## 2021-11-05 MED ORDER — NOREPINEPHRINE 4 MG/250ML-% IV SOLN
2.0000 ug/min | INTRAVENOUS | Status: DC
  Administered 2021-11-05: 20 ug/min via INTRAVENOUS
  Administered 2021-11-05: 2 ug/min via INTRAVENOUS
  Filled 2021-11-05 (×2): qty 250

## 2021-11-05 MED ORDER — SODIUM CHLORIDE 0.9 % IV SOLN: INTRAVENOUS | Status: DC

## 2021-11-05 MED ORDER — SODIUM CHLORIDE 0.9 % IV SOLN
2.0000 g | Freq: Once | INTRAVENOUS | Status: AC
  Administered 2021-11-05: 2 g via INTRAVENOUS
  Filled 2021-11-05: qty 12.5

## 2021-11-05 MED ORDER — FENTANYL CITRATE PF 50 MCG/ML IJ SOSY
PREFILLED_SYRINGE | INTRAMUSCULAR | Status: AC
  Filled 2021-11-05: qty 1

## 2021-11-05 MED ORDER — MIDAZOLAM HCL 2 MG/2ML IJ SOLN
1.0000 mg | Freq: Once | INTRAMUSCULAR | Status: AC
  Administered 2021-11-05: 1 mg via INTRAVENOUS

## 2021-11-05 MED ORDER — NOREPINEPHRINE 16 MG/250ML-% IV SOLN
0.0000 ug/min | INTRAVENOUS | Status: DC
  Administered 2021-11-05: 18 ug/min via INTRAVENOUS
  Filled 2021-11-05: qty 250

## 2021-11-05 MED ORDER — VITAMIN K1 10 MG/ML IJ SOLN
10.0000 mg | INTRAVENOUS | Status: AC
  Administered 2021-11-05: 10 mg via INTRAVENOUS
  Filled 2021-11-05: qty 1

## 2021-11-05 MED ORDER — ONDANSETRON HCL 4 MG/2ML IJ SOLN
INTRAMUSCULAR | Status: AC
  Filled 2021-11-05: qty 2

## 2021-11-05 MED ORDER — ORAL CARE MOUTH RINSE: 15.0000 mL | OROMUCOSAL | Status: DC | PRN

## 2021-11-05 MED ORDER — PENTAFLUOROPROP-TETRAFLUOROETH EX AERO: 1.0000 | INHALATION_SPRAY | CUTANEOUS | Status: DC | PRN

## 2021-11-05 MED ORDER — FENTANYL CITRATE (PF) 100 MCG/2ML IJ SOLN
INTRAMUSCULAR | Status: AC
  Filled 2021-11-05: qty 2

## 2021-11-05 MED ORDER — METRONIDAZOLE 500 MG/100ML IV SOLN
500.0000 mg | Freq: Once | INTRAVENOUS | Status: AC
  Administered 2021-11-05: 500 mg via INTRAVENOUS
  Filled 2021-11-05: qty 100

## 2021-11-05 MED ORDER — ONDANSETRON HCL 4 MG/2ML IJ SOLN
4.0000 mg | Freq: Once | INTRAMUSCULAR | Status: AC
  Administered 2021-11-05: 4 mg via INTRAVENOUS
  Filled 2021-11-05: qty 2

## 2021-11-05 MED ORDER — ONDANSETRON HCL 4 MG/2ML IJ SOLN
4.0000 mg | INTRAMUSCULAR | Status: AC
  Administered 2021-11-05: 4 mg via INTRAVENOUS

## 2021-11-05 MED ORDER — FENTANYL CITRATE (PF) 100 MCG/2ML IJ SOLN
INTRAMUSCULAR | Status: DC | PRN
  Administered 2021-11-05: 25 ug via INTRAVENOUS
  Administered 2021-11-05: 50 ug via INTRAVENOUS

## 2021-11-05 MED ORDER — IODIXANOL 320 MG/ML IV SOLN
INTRAVENOUS | Status: DC | PRN
  Administered 2021-11-05: 45 mL

## 2021-11-05 MED ORDER — SODIUM CHLORIDE 0.9 % IV SOLN
250.0000 mL | INTRAVENOUS | Status: DC
  Administered 2021-11-05 – 2021-11-12 (×3): 250 mL via INTRAVENOUS

## 2021-11-05 MED ORDER — HEPARIN SODIUM (PORCINE) 1000 UNIT/ML DIALYSIS
1000.0000 [IU] | INTRAMUSCULAR | Status: DC | PRN
  Administered 2021-11-10: 1000 [IU]

## 2021-11-05 MED ORDER — SODIUM CHLORIDE 0.9% IV SOLUTION: Freq: Once | INTRAVENOUS | Status: AC

## 2021-11-05 MED ORDER — CEFAZOLIN SODIUM-DEXTROSE 2-4 GM/100ML-% IV SOLN
INTRAVENOUS | Status: AC
  Administered 2021-11-05: 2 g via INTRAVENOUS
  Filled 2021-11-05: qty 100

## 2021-11-05 MED ORDER — IOHEXOL 350 MG/ML SOLN
100.0000 mL | Freq: Once | INTRAVENOUS | Status: AC | PRN
  Administered 2021-11-05: 100 mL via INTRAVENOUS

## 2021-11-05 MED ORDER — FENTANYL CITRATE PF 50 MCG/ML IJ SOSY
12.5000 ug | PREFILLED_SYRINGE | INTRAMUSCULAR | Status: DC | PRN
  Administered 2021-11-05 (×3): 25 ug via INTRAVENOUS
  Administered 2021-11-05: 12.5 ug via INTRAVENOUS
  Administered 2021-11-05 – 2021-11-06 (×3): 25 ug via INTRAVENOUS
  Filled 2021-11-05 (×7): qty 1

## 2021-11-05 MED ORDER — MIDAZOLAM HCL 2 MG/2ML IJ SOLN
INTRAMUSCULAR | Status: DC | PRN
  Administered 2021-11-05 (×2): 1 mg via INTRAVENOUS

## 2021-11-05 MED ORDER — HYDROMORPHONE HCL 1 MG/ML IJ SOLN
INTRAMUSCULAR | Status: AC
  Filled 2021-11-05: qty 1

## 2021-11-05 MED ORDER — MIDAZOLAM HCL 2 MG/2ML IJ SOLN
0.5000 mg | Freq: Once | INTRAMUSCULAR | Status: AC
  Administered 2021-11-05: 0.5 mg via INTRAVENOUS

## 2021-11-05 MED ORDER — MIDAZOLAM HCL 2 MG/2ML IJ SOLN: 0.5000 mg | Freq: Once | INTRAMUSCULAR | Status: AC

## 2021-11-05 MED ORDER — HYDROMORPHONE HCL 1 MG/ML IJ SOLN
1.0000 mg | Freq: Once | INTRAMUSCULAR | Status: AC | PRN
  Administered 2021-11-05: 1 mg via INTRAVENOUS

## 2021-11-05 MED ORDER — DOCUSATE SODIUM 100 MG PO CAPS: 100.0000 mg | ORAL_CAPSULE | Freq: Two times a day (BID) | ORAL | Status: DC | PRN

## 2021-11-05 MED ORDER — LIDOCAINE-PRILOCAINE 2.5-2.5 % EX CREA: 1.0000 | TOPICAL_CREAM | CUTANEOUS | Status: DC | PRN

## 2021-11-05 MED ORDER — CEFAZOLIN SODIUM-DEXTROSE 2-4 GM/100ML-% IV SOLN: 2.0000 g | INTRAVENOUS | Status: AC

## 2021-11-05 MED ORDER — MIDAZOLAM HCL 2 MG/2ML IJ SOLN
INTRAMUSCULAR | Status: AC
  Administered 2021-11-05: 0.5 mg via INTRAVENOUS
  Filled 2021-11-05: qty 2

## 2021-11-05 MED ORDER — PROTHROMBIN COMPLEX CONC HUMAN 500 UNITS IV KIT
2091.0000 [IU] | PACK | Status: AC
  Administered 2021-11-05: 2091 [IU] via INTRAVENOUS
  Filled 2021-11-05: qty 2091

## 2021-11-05 MED ORDER — VANCOMYCIN HCL IN DEXTROSE 1-5 GM/200ML-% IV SOLN: 1000.0000 mg | Freq: Once | INTRAVENOUS | Status: DC

## 2021-11-05 MED ORDER — ALTEPLASE 2 MG IJ SOLR: 2.0000 mg | Freq: Once | INTRAMUSCULAR | Status: DC | PRN

## 2021-11-05 MED ORDER — CHLORHEXIDINE GLUCONATE CLOTH 2 % EX PADS
6.0000 | MEDICATED_PAD | Freq: Every day | CUTANEOUS | Status: DC
  Administered 2021-11-05 – 2021-11-22 (×13): 6 via TOPICAL

## 2021-11-05 MED ORDER — SODIUM CHLORIDE 0.9 % IV SOLN: Freq: Once | INTRAVENOUS | Status: AC

## 2021-11-05 MED ORDER — POLYETHYLENE GLYCOL 3350 17 G PO PACK: 17.0000 g | PACK | Freq: Every day | ORAL | Status: DC | PRN

## 2021-11-05 SURGICAL SUPPLY — 18 items
CATH ANGIO 5F PIGTAIL 65CM (CATHETERS) IMPLANT
CATH BEACON 5 .035 65 C2 TIP (CATHETERS) IMPLANT
CATH MICROCATH PRGRT 2.8F 110 (CATHETERS) IMPLANT
COIL 400 COMPLEX SOFT 3X15CM (Vascular Products) IMPLANT
COIL 400 COMPLEX SOFT 4X15CM (Vascular Products) IMPLANT
COVER PROBE ULTRASOUND 5X96 (MISCELLANEOUS) IMPLANT
DEVICE OCCLUSION PODJ5 (Embolic) IMPLANT
DEVICE STARCLOSE SE CLOSURE (Vascular Products) IMPLANT
DEVICE TORQUE (MISCELLANEOUS) IMPLANT
GLIDEWIRE STIFF .35X180X3 HYDR (WIRE) IMPLANT
HANDLE DETACHMENT COIL (MISCELLANEOUS) IMPLANT
MICROCATH PROGREAT 2.8F 110 CM (CATHETERS) ×1
OCCLUSION DEVICE PODJ5 (Embolic) ×2 IMPLANT
PACK ANGIOGRAPHY (CUSTOM PROCEDURE TRAY) ×1 IMPLANT
SHEATH BRITE TIP 5FRX11 (SHEATH) IMPLANT
SYR MEDRAD MARK 7 150ML (SYRINGE) IMPLANT
TUBING CONTRAST HIGH PRESS 72 (TUBING) IMPLANT
WIRE GUIDERIGHT .035X150 (WIRE) IMPLANT

## 2021-11-05 NOTE — H&P (Signed)
NAME:  Teresa Cook, MRN:  [id number], DOB:  [birth year], LOS: 0 ADMISSION DATE:  11/05/2021, CONSULTATION DATE:  11/05/2021 REFERRING MD:  Dr. Karma Greaser, CHIEF COMPLAINT:  Back/abdominal pain, loss of consciousness   Brief Pt Description / Synopsis:  66 y.o female with PMH significant for ESRD on HD, A. Fib & DVT on Warfarin , admitted with Hemorrhagic shock due to right renal hemorrhage with large hematoma in setting of supratherapeutic INR (INR >9).  Vascular Surgery consulted, required coil embolization of the right renal artery.  History of Present Illness:  Teresa Cook is a 66 year old female with a past medical history significant for ESRD on hemodialysis with chronic PermCath, CHF, DVT, cerebral meningioma who presents to [facility] ED on 11/05/2021 status post fall and loss of consciousness.  Patient reports she lives alone and passed out at least 4 times overnight, and when going to the bathroom around 5 AM this morning,  she passed out again it and thinks she hit her head.  Following the fall she also reports development of back/flank pain that radiates around to the right lower quadrant.  She reports she has been compliant with her hemodialysis, last session on Friday.  She denies chest pain, shortness of breath.  She reports compliance with her warfarin for anticoagulation.  ED Course: Initial Vital Signs: Temperature 97.6 F rectally, blood pressure 125/81, pulse 74, respiratory rate 25 Significant Labs: Bicarb 19, glucose 185, BUN 65, creatinine 10.38, albumin 2.7, BNP 128, high-sensitivity troponin 11, lactic acid 3.8, WBC 12.9 with neutrophilia, hemoglobin 8.0, hematocrit 25.1, prothrombin time 74.3, INR 9.2, APTT 36, TSH 3.3, free T4 0.84 COVID-19 PCR negative Imaging Chest X-ray>>IMPRESSION: 1. No active disease. CT head without contrast>>IMPRESSION: 1. Broad-based right anterior convexity scalp hematoma without underlying skull fracture. 2.  No acute traumatic injury  identified to the brain 3. But slowly enlarging chronic midline posterior fossa Meningioma, now up to 28 mm long axis versus 17 mm in 2017. No associated edema or significant mass effect. Recommend follow-up with Neurosurgery. CT cervical spine>>IMPRESSION: 1. No acute traumatic injury identified in the cervical spine. Mild for age cervical spine degeneration. 2. Aortic Atherosclerosis (ICD10-I70.0). Left-side dialysis catheter. CTA chest/abdomen/pelvis>>IMPRESSION: 1. Positive for Severe Acute Right Renal hemorrhage. Underlying chronic renal disease of dialysis with Active Renal Vessel Contrast Extravasation visible on delayed series 13, and large volume retroperitoneal/intraperitoneal hemorrhage of > 600 mL. This Critical Value/emergent result called by telephone at the time of interpretation on 11/05/2021 at 6:49 am to Dr. Hinda Kehr , who verbally acknowledged these results. 2. No aortic dissection (but see #3) or other significant acute finding; small right pleural and pericardial effusions appear to be transudate and are of doubtful significance. 3. 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 Medications Administered: Cefepime and vancomycin.  Vitamin K, Kcentra, Levophed  PCCM asked to admit the patient for further work-up and treatment.  Vascular surgery has been consulted.  Please see "significant hospital events" section below for full detailed hospital course.   Pertinent  Medical History   Past Medical History:  Diagnosis Date   Anemia    Anxiety    Breast cancer ([place]) 01/2016   bilateral   CHF (congestive heart failure) (HCC)    Chronic kidney disease    Depression    Dialysis patient [facility])    DVT (deep venous thrombosis) ([place])    left leg   DVT (deep venous thrombosis) ([place]) 1985  right thigh   Dysrhythmia    Gout    Headache    HTN (hypertension)     Hypertension    Parathyroid abnormality (HCC)    Parathyroid disease ([place])    Pneumonia 12/2015   Psoriasis    Renal insufficiency    Sickle cell trait ([place])    traits     Micro Data:  10/30: SARS-CoV-2 PCR>>negative 10/30: Blood culture x2>> 10/30: MRSA PCR>>negative  Antimicrobials:  Cefepime 10/30 x1 dose Flagyl 10/30 x1 dose Vancomycin 10/30 x1 dose  Significant Hospital Events: Including procedures, antibiotic start and stop dates in addition to other pertinent events   10/30: Presented to ED, found to have hemorrhagic shock due to right renal hemorrhage.  Coumadin reversed with Kcentra and Vitamin K.  2 units of FFP and 2 unit of pRBCs administered.  Vascular Surgery plans for embolization of right renal artery  Interim History / Subjective:  -Pt seen and examined in ICU -Awake and alert, on pressors (15 mcg Levophed) ~will need central access once INR improved -INR on admission >9, reversal with Kcentra, Vitamin K, and 2 FFP ~ follow up INR 1.4 ~vascular surgery plans for right renal artery embolization -Follow up hemoglobin is 5.8~ordered for 2 units of PRBCs   Objective   Blood pressure 91/68, pulse 80, temperature (!) 97.5 F (36.4 C), temperature source Oral, resp. rate 19, weight 57.2 kg, SpO2 100 %.       No intake or output data in the 24 hours ending 11/05/21 0742 Filed Weights   11/05/21 0705  Weight: 57.2 kg    Examination: General: Acute on chronically ill-appearing female, laying in bed, on nasal cannula, no acute distress HENT: Small hematoma to the right forehead, normocephalic, neck supple, no JVD Lungs: Clear breath sounds throughout, no wheezing or rales noted, even, nonlabored Cardiovascular: Tachycardia, irregular rhythm, no murmurs, rubs, gallops Abdomen: Soft, slight distention, tender to palpation, no guarding or rebound tenderness, bowel sounds hypoactive Extremities: Normal bulk and tone, no deformities, no edema Neuro: Lethargic,  arouses easily to voice, oriented x3, moves all extremities to commands, no focal deficits, speech clear, pupils PERRLA GU: Deferred, patient reports she is anuric  Resolved Hospital Problem list     Assessment & Plan:   #Hemorrhagic shock in the setting of right renal hemorrhage #Syncope in setting of shock PMHx: HFpEF, DVT, A. Fib on Warfarin, HTN Echocardiogram 07/29/2021: LVEF 50 to 37%, grade 2 diastolic dysfunction, RV systolic function normal, mildly elevated pulmonary artery systolic pressure, mild to moderate mitral valve regurgitation -CT Head on presentation negative for acute intracranial abnormality -Continuous cardiac monitoring -Maintain MAP >65 -Cautious IV fluids -Transfusions as indicated -Vasopressors as needed to maintain MAP goal -Trend lactic acid until normalized -HS Troponin negative x2 -Vascular surgery is consulted, appreciate input ~plan for embolization of the right renal artery once supratherapeutic INR reversed -Repeat Echocardiogram and obtain Carotid US  #Acute blood loss anemia #Supratherapeutic INR PMHx: Pt on outpatient coumadin for A.fib & DVT -Monitor for S/Sx of bleeding -Trend CBC (H&H q6h) -SCD's for VTE Prophylaxis  -Transfuse for Hgb <8 -Coumadin reversed with Kcentra, vitamin K, 2 units of FFP -Continue to follow coags and INR  #ESRD on Hemodialysis (M,W,F) -Monitor I&O's / urinary output -Follow BMP -Ensure adequate renal perfusion -Avoid nephrotoxic agents as able -Replace electrolytes as indicated -Nephrology consulted, appreciate input ~hemodialysis as per nephrology  #Mild Leukocytosis, suspect reactive -Monitor fever curve -Trend WBC's & Procalcitonin -Follow cultures as above -Holding off on further  ABX at this time  #Hyperglycemia -CBG's q4h; Target range of 140 to 180 -SSI -Follow ICU Hypo/Hyperglycemia protocol -Check Hgb A1c    Pt is critically ill, prognosis is guarded.  High risk for further decompensation,  cardiac arrest, and death.  Best Practice (right click and "Reselect all SmartList Selections" daily)   Diet/type: NPO DVT prophylaxis: SCD GI prophylaxis: N/A Lines: Central line and yes and it is still needed Foley:  N/A Code Status:  full code Last date of multidisciplinary goals of care discussion [11/05/21]  Updated patient's daughter and multiple sisters at bedside 10/30.  Labs   CBC: Recent Labs  Lab 11/05/21 0320  WBC 12.9*  NEUTROABS 9.5*  HGB 8.0*  HCT 25.1*  MCV 87.5  PLT 831    Basic Metabolic Panel: Recent Labs  Lab 11/05/21 0320  NA 141  K 4.7  CL 107  CO2 19*  GLUCOSE 185*  BUN 65*  CREATININE 10.38*  CALCIUM 7.8*   GFR: Estimated Creatinine Clearance: 4.7 mL/min (A) (by C-G formula based on SCr of 10.38 mg/dL (H)). Recent Labs  Lab 11/05/21 0320 11/05/21 0450  WBC 12.9*  --   LATICACIDVEN 3.8* 2.6*    Liver Function Tests: Recent Labs  Lab 11/05/21 0320  AST 19  ALT 11  ALKPHOS 58  BILITOT 0.5  PROT 5.1*  ALBUMIN 2.7*   No results for input(s): "LIPASE", "AMYLASE" in the last 168 hours. No results for input(s): "AMMONIA" in the last 168 hours.  ABG    Component Value Date/Time   PHART 7.35 07/27/2021 1218   PCO2ART 41 07/27/2021 1218   PO2ART 274 (H) 07/27/2021 1218   HCO3 22.6 07/27/2021 1218   ACIDBASEDEF 2.9 (H) 07/27/2021 1218   O2SAT 99.9 07/27/2021 1218     Coagulation Profile: Recent Labs  Lab 11/05/21 0450  INR 9.2*    Cardiac Enzymes: No results for input(s): "CKTOTAL", "CKMB", "CKMBINDEX", "TROPONINI" in the last 168 hours.  HbA1C: Hemoglobin A1C  Date/Time Value Ref Range Status  12/29/2013 05:04 AM 5.0 4.2 - 6.3 % Final    Comment:    The American Diabetes Association recommends that a primary goal of therapy should be <7% and that physicians should reevaluate the treatment regimen in patients with HbA1c values consistently >8%.    Hgb A1c MFr Bld  Date/Time Value Ref Range Status  07/08/2021  05:05 PM 4.9 4.8 - 5.6 % Final    Comment:    (NOTE)         Prediabetes: 5.7 - 6.4         Diabetes: >6.4         Glycemic control for adults with diabetes: <7.0   08/31/2015 04:37 AM  4.0 - 6.0 % Final   UNABLE TO REPORT A1C DUE TO UNKNOWN INTERFERING FACTOR CAUSING THE ANALYTICAL RANGE TO BE OUTSIDE OF ANALYZER RANGE.  SAMPLE SENT TO [facility] FOR AN ALTERNATIVE METHOD.    CBG: Recent Labs  Lab 11/05/21 0643  GLUCAP 135*    Review of Systems:   Positives in BOLD: Gen: Denies fever, chills, weight change, fatigue, night sweats HEENT: Denies blurred vision, double vision, hearing loss, tinnitus, sinus congestion, rhinorrhea, sore throat, neck stiffness, dysphagia PULM: Denies shortness of breath, cough, sputum production, hemoptysis, wheezing CV: Denies chest pain, edema, orthopnea, paroxysmal nocturnal dyspnea, palpitations GI: Denies abdominal/flank pain, nausea, vomiting, diarrhea, hematochezia, melena, constipation, change in bowel habits GU: Denies dysuria, hematuria, polyuria, oliguria, urethral discharge Endocrine: Denies hot or cold intolerance, polyuria,  polyphagia or appetite change Derm: Denies rash, dry skin, scaling or peeling skin change Heme: Denies easy bruising, bleeding, bleeding gums Neuro: Denies headache, numbness, weakness, slurred speech, loss of memory or consciousness   Past Medical History:  She,  has a past medical history of Anemia, Anxiety, Breast cancer ([place]) (01/2016), CHF (congestive heart failure) ([place]), Chronic kidney disease, Depression, Dialysis patient [facility]), DVT (deep venous thrombosis) ([place]), DVT (deep venous thrombosis) ([place]) (1985), Dysrhythmia, Gout, Headache, HTN (hypertension), Hypertension, Parathyroid abnormality ([place]), Parathyroid disease ([place]), Pneumonia (12/2015), Psoriasis, Renal insufficiency, and Sickle cell trait ([place]).   Surgical History:   Past Surgical History:  Procedure Laterality Date   ABDOMINAL HYSTERECTOMY  1980    APPENDECTOMY     BREAST BIOPSY Left 10/28/2013   benign   BREAST EXCISIONAL BIOPSY Left 2002   benign   CORONARY ANGIOGRAPHY N/A 04/04/2020   Procedure: CORONARY ANGIOGRAPHY;  Surgeon: Wellington Hampshire, MD;  Location: [place] [facility];  Service: Cardiovascular;  Laterality: N/A;   INSERTION OF DIALYSIS CATHETER  2014   LIPOMA EXCISION N/A 01/23/2016   Procedure: EXCISION LIPOMA;  Surgeon: Hubbard Robinson, MD;  Location: [facility] OR[facility];  Service: General;  Laterality: N/A;   MASTECTOMY W/ SENTINEL NODE BIOPSY Bilateral 01/23/2016   Procedure: bilateral MASTECTOMY WITH  bilateral SENTINEL LYMPH NODE BIOPSY possible left axillary node dissection forehead lipoma removal;  Surgeon: Hubbard Robinson, MD;  Location: [facility] ORS;  Service: General;  Laterality: Bilateral;   PARTIAL HYSTERECTOMY     PERIPHERAL VASCULAR CATHETERIZATION N/A 12/25/2015   Procedure: Dialysis/Perma Catheter Insertion;  Surgeon: Algernon Huxley, MD;  Location: [place] [facility];  Service: Cardiovascular;  Laterality: N/A;   PERIPHERAL VASCULAR CATHETERIZATION Left 01/22/2016   Procedure: Dialysis/Perma Catheter Insertion;  Surgeon: Algernon Huxley, MD;  Location: [place] [facility];  Service: Cardiovascular;  Laterality: Left;   PERIPHERAL VASCULAR CATHETERIZATION N/A 01/26/2016   Procedure: Dialysis/Perma Catheter Insertion;  Surgeon: Katha Cabal, MD;  Location: [place] [facility];  Service: Cardiovascular;  Laterality: N/A;   PORT-A-CATH REMOVAL N/A 12/20/2015   Procedure: REMOVAL PORT-A-CATH;  Surgeon: Hubbard Robinson, MD;  Location: [facility] OR[facility];  Service: General;  Laterality: N/A;  left     PORTACATH PLACEMENT Left 08/21/2015   Procedure: INSERTION PORT-A-CATH;  Surgeon: Hubbard Robinson, MD;  Location: [facility] OR[facility];  Service: General;  Laterality: Left;   REMOVAL OF A DIALYSIS CATHETER  2017   RIGHT HEART CATH N/A 04/04/2020   Procedure: RIGHT HEART CATH;  Surgeon: Wellington Hampshire, MD;  Location: [place] [facility]  [facility];  Service: Cardiovascular;  Laterality: N/A;     Social History:   reports that she has never smoked. She has never used smokeless tobacco. She reports that she does not drink alcohol and does not use drugs.   Family History:  Her family history includes Breast cancer (age of onset: 37) in her maternal aunt; CVA in her mother; Cancer (age of onset: 79) in her sister; Diabetes in her sister; Hypertension in her brother, father, mother, and sister; Stroke in her brother and mother.   Allergies Allergies  Allergen Reactions   Gabapentin Other (See Comments)    Seizure   Adhesive [Tape] Itching    Silk tape is ok to use.     Home Medications  Prior to Admission medications   Medication Sig Start Date End Date Taking? Authorizing Provider  albuterol (VENTOLIN HFA) 108 (90 Base) MCG/ACT inhaler Inhale 2 puffs into the lungs every  4 (four) hours as needed for wheezing or shortness of breath. Patient not taking: Reported on 07/27/2021 10/26/20   Sharen Hones, MD  aspirin-acetaminophen-caffeine [facility] MIGRAINE) [id number] MG tablet Take 1-2 tablets by mouth every 6 (six) hours as needed for headache.    [provider]  atorvastatin (LIPITOR) 40 MG tablet Take 1 tablet (40 mg total) by mouth daily. 07/31/21   Loletha Grayer, MD  b complex-vitamin c-folic acid (NEPHRO-VITE) 0.8 MG TABS tablet Take 1 tablet by mouth daily.    [provider]  calcium carbonate (TUMS - DOSED IN MG ELEMENTAL CALCIUM) 500 MG chewable tablet Chew 1 tablet (200 mg of elemental calcium total) by mouth 2 (two) times daily as needed for indigestion or heartburn. 07/30/21   Loletha Grayer, MD  carvedilol (COREG) 12.5 MG tablet Take 1 tablet (12.5 mg total) by mouth 2 (two) times daily. 07/30/21 08/29/21  Loletha Grayer, MD  cinacalcet (SENSIPAR) 60 MG tablet Take 60 mg by mouth daily before lunch.    [provider]  hydrALAZINE (APRESOLINE) 25 MG tablet Take 1 tablet (25 mg total) by  mouth 3 (three) times daily. 07/30/21   Loletha Grayer, MD  isosorbide mononitrate (IMDUR) 30 MG 24 hr tablet Take 1 tablet (30 mg total) by mouth daily. 07/31/21   Loletha Grayer, MD  megestrol (MEGACE) 400 MG/10ML suspension Take 5 mLs (200 mg total) by mouth daily. 07/30/21   Loletha Grayer, MD  pantoprazole (PROTONIX) 40 MG tablet Take 1 tablet (40 mg total) by mouth 2 (two) times daily. 07/30/21   Loletha Grayer, MD  PARoxetine (PAXIL) 40 MG tablet Take 1 tablet (40 mg total) by mouth daily. 07/30/21 08/29/21  Loletha Grayer, MD  sacubitril-valsartan (ENTRESTO) 24-26 MG Take 1 tablet by mouth 2 (two) times daily. 02/15/21   Kate Sable, MD  sevelamer carbonate (RENVELA) 800 MG tablet Take 3 tablets (2,400 mg total) by mouth with breakfast, with lunch, and with evening meal. One with snacks 07/30/21   Loletha Grayer, MD  traZODone (DESYREL) 50 MG tablet Take 50 mg by mouth at bedtime. 03/01/21   [provider]  warfarin (COUMADIN) 5 MG tablet Take 1-1.5 tablets (5-7.5 mg total) by mouth See admin instructions. Take 1 tablet ('5mg'$ ) by mouth every Monday, Wednesday, Friday, Saturday and Sunday evening and take 1.5  tablets (7.'5mg'$ ) by mouth every Tuesday and Thursday evening 10/26/20   Sharen Hones, MD  zolpidem (AMBIEN) 10 MG tablet Take 0.5 tablets (5 mg total) by mouth at bedtime. 07/30/21   Loletha Grayer, MD     Critical care time: 50 minutes     Darel Hong, AGACNP-BC [facility] Prefer epic messenger for cross cover needs If after hours, please call E-link

## 2021-11-05 NOTE — Progress Notes (Signed)
Pre hd rn assessment 

## 2021-11-05 NOTE — Consult Note (Signed)
PHARMACY CONSULT NOTE  Pharmacy Consult for Electrolyte Monitoring and Replacement   Recent Labs: Potassium (mmol/L)  Date Value  11/05/2021 4.7  02/03/2014 4.0   Magnesium (mg/dL)  Date Value  07/28/2021 1.8   Calcium (mg/dL)  Date Value  11/05/2021 7.8 (L)   Calcium, Total (mg/dL)  Date Value  02/03/2014 8.6   Albumin (g/dL)  Date Value  11/05/2021 2.7 (L)  02/02/2014 3.5   Phosphorus (mg/dL)  Date Value  07/28/2021 4.1  01/17/2014 4.9   Sodium (mmol/L)  Date Value  11/05/2021 141  02/29/2020 140  02/03/2014 138   Assessment: Patient is a 66 y/o F with medical history including depression / anxiety, DVT, 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 Patient subsequently found to have severe acute right renal hemorrhage in setting of supratherapeutic INR on warfarin. Patient ordered emergent reversal with Kcentra and vitamin K. Pharmacy consulted to assist with electrolyte monitoring and replacement as indicated.  Goal of Therapy:  Within normal limits  Plan:  --No electrolyte replacement indicated at this time --Follow-up electrolytes with AM labs tomorrow  Teresa Cook 11/05/2021 7:42 AM

## 2021-11-05 NOTE — Progress Notes (Signed)
Patient admitted to ICU 03 from home. Patient is alert and oriented to self, place, time and situation. Patient c/o abd pain/pressure on arrival 10/10. Levophed infusing at 26mg/min. EBenjamine Mola NP in to see patient.

## 2021-11-05 NOTE — ED Notes (Signed)
Back from CT

## 2021-11-05 NOTE — ED Notes (Signed)
Report given to Melissa

## 2021-11-05 NOTE — ED Notes (Signed)
To ct

## 2021-11-05 NOTE — ED Notes (Signed)
Patient states she passed out 2 times at home and took Ambien. Patient states she vomited after taking 1 tab and took another 1/2 tablet after vomiting.

## 2021-11-05 NOTE — Consult Note (Signed)
[facility] Consult Note  MRN : [id number]  Teresa Cook is a 66 y.o. ([birth year]) female who presents with chief complaint of  Chief Complaint  Patient presents with   Loss of Consciousness   Back Pain  .  History of Present Illness: I am asked to see the patient by Rufina Falco for evaluation of right renal hemorrhage with extremely large hematoma and hypotensive shock from acute blood loss.  The patient had multiple falls last night and has been having back and flank pain.  She was admitted to the hospital and resuscitated and a CT angiogram was performed which I have independently reviewed.  This demonstrates a very large right perinephric hematoma with what appears to be active extravasation and hemorrhage from the right kidney.  She is a dialysis patient.  Complicating the situation in addition to her anemia is a significant coagulopathy with an INR of 9.2.  The patient is very weak and is having a difficult time providing history.  She is complaining of significant abdominal and back pain.  Current Facility-Administered Medications  Medication Dose Route Frequency Provider Last Rate Last Admin   0.9 %  sodium chloride infusion  250 mL Intravenous Continuous Hinda Kehr, MD 10 mL/hr at 11/05/21 0730 Infusion Verify at 11/05/21 0730   0.9 %  sodium chloride infusion   Intravenous Continuous Shadana Pry, Erskine Squibb, MD       alteplase (CATHFLO ACTIVASE) injection 2 mg  2 mg Intracatheter Once PRN Colon Flattery, NP       anticoagulant sodium citrate solution 5 mL  5 mL Intracatheter PRN Colon Flattery, NP       ceFAZolin (ANCEF) IVPB 2g/100 mL premix  2 g Intravenous 30 min Pre-Op Lucky Cowboy, Erskine Squibb, MD       Chlorhexidine Gluconate Cloth 2 % PADS 6 each  6 each Topical Q0600 Colon Flattery, NP   6 each at 11/05/21 0700   docusate sodium (COLACE) capsule 100 mg  100 mg Oral BID PRN Lang Snow, NP       fentaNYL (SUBLIMAZE) injection 12.5-25 mcg   12.5-25 mcg Intravenous Q2H PRN Darel Hong D, NP   12.5 mcg at 11/05/21 0830   heparin injection 1,000 Units  1,000 Units Intracatheter PRN Breeze, Benancio Deeds, NP       lidocaine (PF) (XYLOCAINE) 1 % injection 5 mL  5 mL Intradermal PRN Colon Flattery, NP       lidocaine-prilocaine (EMLA) cream 1 Application  1 Application Topical PRN Colon Flattery, NP       norepinephrine (LEVOPHED) '4mg'$  in 274m (0.016 mg/mL) premix infusion  2-10 mcg/min Intravenous Titrated FHinda Kehr MD 75 mL/hr at 11/05/21 0941 20 mcg/min at 11/05/21 0941   ondansetron (ZOFRAN) injection 4 mg  4 mg Intravenous STAT FHinda Kehr MD       pentafluoroprop-tetrafluoroeth (GEBAUERS) aerosol 1 Application  1 Application Topical PRN BColon Flattery NP       polyethylene glycol (MIRALAX / GLYCOLAX) packet 17 g  17 g Oral Daily PRN OLang Snow NP       vancomycin (VANCOCIN) IVPB 1000 mg/200 mL premix  1,000 mg Intravenous Once FHinda Kehr MD        Past Medical History:  Diagnosis Date   Anemia    Anxiety    Breast cancer ([facility] 01/2016   bilateral   CHF (congestive heart failure) ([facility]    Chronic kidney disease    Depression    Dialysis patient ([facility]  DVT (deep venous thrombosis) ([facility])    left leg   DVT (deep venous thrombosis) ([place]) 1985   right thigh   Dysrhythmia    Gout    Headache    HTN (hypertension)    Hypertension    Parathyroid abnormality ([facility])    Parathyroid disease ([place])    Pneumonia 12/2015   Psoriasis    Renal insufficiency    Sickle cell trait ([place])    traits    Past Surgical History:  Procedure Laterality Date   ABDOMINAL HYSTERECTOMY  1980   APPENDECTOMY     BREAST BIOPSY Left 10/28/2013   benign   BREAST EXCISIONAL BIOPSY Left 2002   benign   CORONARY ANGIOGRAPHY N/A 04/04/2020   Procedure: CORONARY ANGIOGRAPHY;  Surgeon: Wellington Hampshire, MD;  Location: [facility];  Service: Cardiovascular;  Laterality: N/A;   INSERTION OF DIALYSIS  CATHETER  2014   LIPOMA EXCISION N/A 01/23/2016   Procedure: EXCISION LIPOMA;  Surgeon: Hubbard Robinson, MD;  Location: [facility];  Service: General;  Laterality: N/A;   MASTECTOMY W/ SENTINEL NODE BIOPSY Bilateral 01/23/2016   Procedure: bilateral MASTECTOMY WITH  bilateral SENTINEL LYMPH NODE BIOPSY possible left axillary node dissection forehead lipoma removal;  Surgeon: Hubbard Robinson, MD;  Location: [facility];  Service: General;  Laterality: Bilateral;   PARTIAL HYSTERECTOMY     PERIPHERAL VASCULAR CATHETERIZATION N/A 12/25/2015   Procedure: Dialysis/Perma Catheter Insertion;  Surgeon: Algernon Huxley, MD;  Location: [facility];  Service: Cardiovascular;  Laterality: N/A;   PERIPHERAL VASCULAR CATHETERIZATION Left 01/22/2016   Procedure: Dialysis/Perma Catheter Insertion;  Surgeon: Algernon Huxley, MD;  Location: [facility];  Service: Cardiovascular;  Laterality: Left;   PERIPHERAL VASCULAR CATHETERIZATION N/A 01/26/2016   Procedure: Dialysis/Perma Catheter Insertion;  Surgeon: Katha Cabal, MD;  Location: [facility];  Service: Cardiovascular;  Laterality: N/A;   PORT-A-CATH REMOVAL N/A 12/20/2015   Procedure: REMOVAL PORT-A-CATH;  Surgeon: Hubbard Robinson, MD;  Location: [facility];  Service: General;  Laterality: N/A;  left     PORTACATH PLACEMENT Left 08/21/2015   Procedure: INSERTION PORT-A-CATH;  Surgeon: Hubbard Robinson, MD;  Location: [facility];  Service: General;  Laterality: Left;   REMOVAL OF A DIALYSIS CATHETER  2017   RIGHT HEART CATH N/A 04/04/2020   Procedure: RIGHT HEART CATH;  Surgeon: Wellington Hampshire, MD;  Location: [facility];  Service: Cardiovascular;  Laterality: N/A;     Social History   Tobacco Use   Smoking status: Never   Smokeless tobacco: Never  Vaping Use   Vaping Use: Never used  Substance Use Topics   Alcohol use: No    Alcohol/week: 0.0 standard drinks of alcohol   Drug use: No    Family History  Problem  Relation Age of Onset   Stroke Mother    CVA Mother    Hypertension Mother    Hypertension Father    Hypertension Sister    Diabetes Sister    Cancer Sister 7       Breast   Stroke Brother    Hypertension Brother    Breast cancer Maternal Aunt 70    Allergies  Allergen Reactions   Gabapentin Other (See Comments)    Seizure   Adhesive [Tape] Itching    Silk tape is ok to use.     REVIEW OF SYSTEMS (Negative unless checked)  Constitutional: '[]'$ Weight loss  '[]'$ Fever  '[]'$ Chills Cardiac: '[]'$ Chest pain   '[]'$   Chest pressure   '[]'$ Palpitations   '[]'$ Shortness of breath when laying flat   '[]'$ Shortness of breath at rest   '[x]'$ Shortness of breath with exertion. Vascular:  '[]'$ Pain in legs with walking   '[]'$ Pain in legs at rest   '[]'$ Pain in legs when laying flat   '[]'$ Claudication   '[]'$ Pain in feet when walking  '[]'$ Pain in feet at rest  '[]'$ Pain in feet when laying flat   '[x]'$ History of DVT   '[]'$ Phlebitis   '[]'$ Swelling in legs   '[]'$ Varicose veins   '[]'$ Non-healing ulcers Pulmonary:   '[]'$ Uses home oxygen   '[]'$ Productive cough   '[]'$ Hemoptysis   '[]'$ Wheeze  '[]'$ COPD   '[]'$ Asthma Neurologic:  '[]'$ Dizziness  '[]'$ Blackouts   '[]'$ Seizures   '[]'$ History of stroke   '[]'$ History of TIA  '[]'$ Aphasia   '[]'$ Temporary blindness   '[]'$ Dysphagia   '[]'$ Weakness or numbness in arms   '[]'$ Weakness or numbness in legs Musculoskeletal:  '[x]'$ Arthritis   '[]'$ Joint swelling   '[x]'$ Joint pain   '[]'$ Low back pain Hematologic:  '[]'$ Easy bruising  '[]'$ Easy bleeding   '[]'$ Hypercoagulable state   '[x]'$ Anemic  '[]'$ Hepatitis Gastrointestinal:  '[]'$ Blood in stool   '[]'$ Vomiting blood  '[]'$ Gastroesophageal reflux/heartburn   '[]'$ Difficulty swallowing. X positive for abdominal pain Genitourinary:  '[x]'$ Chronic kidney disease   '[]'$ Difficult urination  '[]'$ Frequent urination  '[]'$ Burning with urination   '[]'$ Blood in urine Skin:  '[]'$ Rashes   '[]'$ Ulcers   '[]'$ Wounds Psychological:  '[x]'$ History of anxiety   '[x]'$  History of major depression.  Physical Examination  Vitals:   11/05/21 0945 11/05/21 1000 11/05/21 1012  11/05/21 1015  BP: 103/74 103/88 100/67 (!) 89/67  Pulse: 81 80 80 89  Resp: (!) 26 (!) 22 (!) 29 (!) 21  Temp:   (!) 95.8 F (35.4 C)   TempSrc:      SpO2: 100% 100%  100%  Weight:       Body mass index is 20.98 kg/m. Gen: Frail, appears older than stated age.  Acutely ill. Head: [place]/AT, + temporalis wasting.  Ear/Nose/Throat: Hearing grossly intact, nares w/o erythema or drainage, oropharynx w/o Erythema/Exudate Eyes: Sclera non-icteric, conjunctiva clear Neck: Trachea midline.  No JVD.  Pulmonary:  Good air movement, respirations not labored, equal bilaterally.  Cardiac: RRR, no JVD Vascular:  Vessel Right Left  Radial Palpable Palpable                          PT Not palpable Not palpable  DP Not palpable Not palpable   Gastrointestinal: Full, somewhat distended, and tender to palpation on the right side Musculoskeletal: M/S 5/5 throughout.  Extremities without ischemic changes.  No deformity or atrophy. No edema. Neurologic: Sensation grossly intact in extremities.  Symmetrical.  Speech is fluent. Motor exam as listed above. Psychiatric: Judgment intact, Mood & affect appropriate for pt's clinical situation. Dermatologic: No rashes or ulcers noted.  No cellulitis or open wounds.      CBC Lab Results  Component Value Date   WBC 12.9 (H) 11/05/2021   HGB 5.8 (L) 11/05/2021   HCT 17.9 (L) 11/05/2021   MCV 87.5 11/05/2021   PLT 174 11/05/2021    BMET    Component Value Date/Time   NA 141 11/05/2021 0320   NA 140 02/29/2020 1040   NA 138 02/03/2014 0135   K 4.7 11/05/2021 0320   K 4.0 02/03/2014 0135   CL 107 11/05/2021 0320   CL 102 02/03/2014 0135   CO2 19 (L) 11/05/2021 0320   CO2 23 02/03/2014 0135  GLUCOSE 185 (H) 11/05/2021 0320   GLUCOSE 101 (H) 02/03/2014 0135   BUN 65 (H) 11/05/2021 0320   BUN 40 (H) 02/29/2020 1040   BUN 21 (H) 02/03/2014 0135   CREATININE 10.38 (H) 11/05/2021 0320   CREATININE 5.11 (H) 02/03/2014 0135   CALCIUM 7.8 (L)  11/05/2021 0320   CALCIUM 8.6 02/03/2014 0135   GFRNONAA 4 (L) 11/05/2021 0320   GFRNONAA 9 (L) 02/03/2014 0135   GFRNONAA 6 (L) 09/08/2013 0133   GFRAA 5 (L) 02/29/2020 1040   GFRAA 11 (L) 02/03/2014 0135   GFRAA 7 (L) 09/08/2013 0133   Estimated Creatinine Clearance: 4.7 mL/min (A) (by C-G formula based on SCr of 10.38 mg/dL (H)).  COAG Lab Results  Component Value Date   INR 1.4 (H) 11/05/2021   INR 9.2 (HH) 11/05/2021   INR 2.1 (H) 10/01/2021    Radiology CT Angio Chest/Abd/Pel for Dissection W and/or W/WO  Result Date: 11/05/2021 CLINICAL DATA:  66 year old female with recurrent syncope, falls. Dialysis patient on warfarin. EXAM: CT ANGIOGRAPHY CHEST, ABDOMEN AND PELVIS TECHNIQUE: Multidetector CT imaging through the chest, abdomen and pelvis was performed using the standard protocol during bolus administration of intravenous contrast. Multiplanar reconstructed images and MIPs were obtained and reviewed to evaluate the vascular anatomy. RADIATION DOSE REDUCTION: This exam was performed according to the departmental dose-optimization program which includes automated exposure control, adjustment of the mA and/or kV according to patient size and/or use of iterative reconstruction technique. CONTRAST:  13m OMNIPAQUE IOHEXOL 350 MG/ML SOLN COMPARISON:  CTA chest 01/15/2016. CT Abdomen and Pelvis 10/25/2020. FINDINGS: CTA CHEST FINDINGS Cardiovascular: Calcified aortic atherosclerosis. Similar aortic and pulmonary artery contrast timing. Negative for thoracic aortic dissection. Fusiform enlargement of the ascending thoracic aorta up to 42 mm diameter. No pulmonary artery filling defect identified. Calcified coronary artery atherosclerosis. No cardiomegaly. Small simple fluid density anterior pericardial effusion, might be physiologic on series 4, image 61. Mediastinum/Nodes: No mediastinal mass or lymphadenopathy. Lungs/Pleura: Major airways are patent. Lung volumes are stable since 2018. No  pneumothorax or consolidation. Some chronic lung scarring. Small layering low-density right pleural effusion, favor transudate. Musculoskeletal: Chronic thoracic kyphoscoliosis. Chronically abnormal bone mineralization compatible with renal osteodystrophy. Thoracic vertebrae appear stable and intact. No sternal fracture identified. Visible shoulder osseous structures appear intact. No acute rib fracture identified. Review of the MIP images confirms the above findings. CTA ABDOMEN AND PELVIS FINDINGS VASCULAR Extensive Aortoiliac calcified atherosclerosis. Negative for abdominal aortic aneurysm or dissection. Major arterial structures in the abdomen and pelvis remain patent. Review of the MIP images confirms the above findings. NON-VASCULAR Hepatobiliary: Liver and gallbladder appear to remain within normal limits, but there is hemoperitoneum around the liver and in the right retroperitoneum adjacent. Pancreas: Within normal limits. Spleen: Appears to remain normal but there is perisplenic complex fluid/blood. 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 Adrenal glands and left kidney appear stable since 2022. But there is a large acute renal hemorrhage with bulky hematoma in the right pararenal space communicating with the right retroperitoneal and peritoneal spaces. Active contrast extravasation identified on series 13 delayed images in the region of the right lower pole (series 13, images 67 and 69, possibly also the anterior renal mid to lower Pole on image 49. large volume hemoperitoneum and abdominal hematoma. Dominant component of hemorrhage in the right abdomen is at least 600 mL. Additional pelvic hemoperitoneum. Urinary bladder is contracted and compressed. Chronic dystrophic calcifications in the pelvis. Stomach/Bowel: Decompressed large and small bowel throughout the  abdomen and pelvis, with mass effect on the right peritoneal structures from abnormal renal and pararenal  spaces. Relatively decompressed stomach and duodenum. No free air. Lymphatic: No lymphadenopathy identified. Reproductive: Chronically absent uterus. Diminutive or obscured ovaries. Other: Moderate to large volume hemoperitoneum in the pelvis. Musculoskeletal: Chronic heterogeneous bone mineralization compatible with renal osteodystrophy. Lumbar vertebrae appear stable and intact. Pelvis, SI joints and proximal femurs appear stable and intact. Review of the MIP images confirms the above findings. IMPRESSION: 1. Positive for Severe Acute Right Renal hemorrhage. Underlying chronic renal disease of dialysis with Active Renal Vessel Contrast Extravasation visible on delayed series 13, and large volume retroperitoneal/intraperitoneal hemorrhage of > 600 mL. This Critical Value/emergent result called by telephone at the time of interpretation on 11/05/2021 at 6:49 am to Dr. Hinda Kehr , who verbally acknowledged these results. 2. No aortic dissection (but see #3) or other significant acute finding; small right pleural and pericardial effusions appear to be transudate and are of doubtful significance. 3. 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 This recommendation follows 2010 ACCF/AHA/AATS/ACR/ASA/SCA/SCAI/SIR/STS/SVM Guidelines for the Diagnosis and Management of Patients with Thoracic Aortic Disease. Circulation. 2010; 121: E332-R518. Aortic aneurysm NOS (ICD10-I71.9). 4. Chronic renal osteodystrophy, thoracic kyphoscoliosis. Electronically Signed   By: Genevie Ann M.D.   On: 11/05/2021 06:54   CT Cervical Spine Wo Contrast  Result Date: 11/05/2021 CLINICAL DATA:  66 year old female with recurrent syncope, falls. Might have struck head. Chronic tentorial meningioma. Dialysis patient. EXAM: CT CERVICAL SPINE WITHOUT CONTRAST TECHNIQUE: Multidetector CT imaging of the cervical spine was performed without intravenous  contrast. Multiplanar CT image reconstructions were also generated. RADIATION DOSE REDUCTION: This exam was performed according to the departmental dose-optimization program which includes automated exposure control, adjustment of the mA and/or kV according to patient size and/or use of iterative reconstruction technique. COMPARISON:  Head CT today reported separately.  Neck CT 05/18/2013. FINDINGS: Alignment: Normal cervical lordosis. Cervicothoracic junction alignment is within normal limits. Bilateral posterior element alignment is within normal limits. Skull base and vertebrae: Visualized skull base is intact. No atlanto-occipital dissociation. C1 and C2 appear intact and aligned. No acute osseous abnormality identified. Soft tissues and spinal canal: No prevertebral fluid or swelling. No visible canal hematoma. Negative visible noncontrast neck soft tissues except for calcified carotid atherosclerosis and left IJ approach dual lumen dialysis type catheter. There is trace intravenous gas at the thoracic inlet, likely related to recent IV access. Disc levels:  Mild for age cervical spine degeneration. Upper chest: Calcified aortic atherosclerosis. Partially visible upper thoracic scoliosis. Negative lung apices. IMPRESSION: 1. No acute traumatic injury identified in the cervical spine. Mild for age cervical spine degeneration. 2. Aortic Atherosclerosis (ICD10-I70.0). Left-side dialysis catheter. Electronically Signed   By: Genevie Ann M.D.   On: 11/05/2021 04:38   CT Head Wo Contrast  Result Date: 11/05/2021 CLINICAL DATA:  66 year old female with recurrent syncope, falls. Might have struck head. Chronic tentorial meningioma. EXAM: CT HEAD WITHOUT CONTRAST TECHNIQUE: Contiguous axial images were obtained from the base of the skull through the vertex without intravenous contrast. RADIATION DOSE REDUCTION: This exam was performed according to the departmental dose-optimization program which includes automated  exposure control, adjustment of the mA and/or kV according to patient size and/or use of iterative reconstruction technique. COMPARISON:  Brain MRI 01/01/2016.  Head CT 07/08/2021. FINDINGS: Brain: Scattered chronic somewhat unusual dural calcifications including adjacent to both anterior clinoid processes. Partially calcified chronic  lobulated midline posterior fossa meningioma at the tentorial incisor a measures 24 x 27 x 28 mm and appears 2 mm larger since July. This meningioma was up to 17 mm in 2017. But regional mass effect remains mild and there is no associated cerebral or cerebellar edema. Basilar cisterns remain normal. No superimposed No midline shift, ventriculomegaly, intracranial hemorrhage or evidence of cortically based acute infarction. Gray-white matter differentiation is within normal limits throughout the brain. Vascular: Calcified atherosclerosis at the skull base. No suspicious intracranial vascular hyperdensity. Skull: Stable and intact. Sinuses/Orbits: Visualized paranasal sinuses and mastoids are clear. Other: Broad-based right anterior convexity scalp hematoma measures up to 10 mm in thickness. Superimposed scalp vessel calcified atherosclerosis. No scalp soft tissue. Underlying right frontal bone appears stable and intact. Visualized orbit soft tissues are within normal limits. IMPRESSION: 1. Broad-based right anterior convexity scalp hematoma without underlying skull fracture. 2.  No acute traumatic injury identified to the brain 3. But slowly enlarging chronic midline posterior fossa Meningioma, now up to 28 mm long axis versus 17 mm in 2017. No associated edema or significant mass effect. Recommend follow-up with Neurosurgery. Electronically Signed   By: Genevie Ann M.D.   On: 11/05/2021 04:34   DG Chest Port 1 View  Result Date: 11/05/2021 CLINICAL DATA:  Sepsis EXAM: PORTABLE CHEST 1 VIEW COMPARISON:  07/27/2021 FINDINGS: Lungs are clear. No pneumothorax or pleural effusion. Left  internal jugular tunnel hemodialysis catheter is seen with its tip within the right atrium. Cardiac size within normal limits. Pulmonary vascularity is normal. No acute bone abnormality. IMPRESSION: 1. No active disease. Electronically Signed   By: Fidela Salisbury M.D.   On: 11/05/2021 04:10      Assessment/Plan 1.  Right renal hemorrhage with hypovolemic shock and hypotension.  INR markedly elevated at 9.2 which is being reversed and once this is reversed we will get her down for embolization of the right renal artery which would be far less invasive than the nephrectomy and this critically ill dialysis patient.  Risks and benefits were discussed.  This is clearly a critical and life-threatening situation. 2.  End-stage renal disease.  No hesitation to embolize the kidney and the patient already on dialysis with life-threatening hemorrhage 3.  Coagulopathy.  INR of 9.2 which is being reversed by the primary service 4.  Anemia.  Acute blood loss and likely some degree of anemia from end-stage renal disease.  Getting blood and blood products.  We are planning embolization to try to stop the hemorrhage.   Leotis Pain, MD  11/05/2021 10:54 AM    This note was created with Dragon medical transcription system.  Any error is purely unintentional

## 2021-11-05 NOTE — Procedures (Signed)
Central Venous Catheter Insertion Procedure Note  Teresa Cook  [id number]  May 28, 1955  Date:11/05/21  Time:12:50 PM   Provider Performing:Dresden Ament D Dewaine Conger   Procedure: Insertion of Non-tunneled Central Venous [phone]) with US guidance ([id number])   Indication(s) Medication administration and Difficult access  Consent Risks of the procedure as well as the alternatives and risks of each were explained to the patient and/or caregiver.  Consent for the procedure was obtained and is signed in the bedside chart  Anesthesia Topical only with 1% lidocaine   Timeout Verified patient identification, verified procedure, site/side was marked, verified correct patient position, special equipment/implants available, medications/allergies/relevant history reviewed, required imaging and test results available.  Sterile Technique Maximal sterile technique including full sterile barrier drape, hand hygiene, sterile gown, sterile gloves, mask, hair covering, sterile ultrasound probe cover (if used).  Procedure Description Area of catheter insertion was cleaned with chlorhexidine and draped in sterile fashion.  With real-time ultrasound guidance a central venous catheter was placed into the left femoral vein. Nonpulsatile blood flow and easy flushing noted in all ports.  The catheter was sutured in place and sterile dressing applied.  Complications/Tolerance None; patient tolerated the procedure well. Chest X-ray is ordered to verify placement for internal jugular or subclavian cannulation.   Chest x-ray is not ordered for femoral cannulation.  EBL Minimal  Specimen(s) None    Line secured at the 20 cm mark. BIOPATCH applied to the insertion site.   Darel Hong, AGACNP-BC [facility] Prefer epic messenger for cross cover needs If after hours, please call E-link

## 2021-11-05 NOTE — ED Triage Notes (Addendum)
Patient brought in by EMS from home. Patient lives alone. Patient states she passed out 4 times lastnight. Patient states she was getting up to go to the bathroom at 1700 and passed out and may have hit her head and back. Patient also complains of back pain that radiates to the RLQ. Patient has history of dialysis with treatment on MWF. Patient states no complications at Fridays treatment. Patient takes warfarin.

## 2021-11-05 NOTE — Progress Notes (Signed)
Attempt made to place left femoral A-line for continuous BP monitoring.  Able to cannulate left artery easily, however unable to thread the guidewire (guidewire bent).  Attempt stopped and new kit obtained for repeat attempt.  Angle of insertion changed slightly, again able to successfully cannulate left femoral artery, again unable to thread wire through therefore attempt aborted.    Pressure held at the site.  No signs of hematoma or bleeding.    Darel Hong, AGACNP-BC [facility] Prefer epic messenger for cross cover needs If after hours, please call E-link

## 2021-11-05 NOTE — Progress Notes (Signed)
[facility]  ROUNDING NOTE   Subjective:   Teresa Cook is a 66 year old African-American female with past medical conditions including hypertension, gout, anxiety with depression, sickle cell trait, systolic heart failure with a EF 40 to 45%, breast cancer with bilateral mastectomy, seizures, and end-stage renal disease on hemodialysis.  Patient presents to the emergency department via EMS for multiple syncopal episodes.  Patient has been admitted for Right flank pain [R10.9] Elevated lactic acid level [R79.89] Hypotension [I95.9] Hypotension, unspecified hypotension type [I95.9] Altered mental status, unspecified altered mental status type [R41.82]  Patient is known to our practice and receives outpatient dialysis treatments at [facility] on a MWF schedule, supervised by Dr. Candiss Norse.  Last treatment received on Friday, tolerated well with no complications.  Patient reports "passing out" multiple times during the previous day.  States she may have hit her head and back during one of her episodes.  Denies nausea or vomiting.  States she began feeling weak 2 to 3 days prior.  Remains on room air.  Labs on ED arrival include serum bicarb 19, glucose 185, BUN 65, creatinine 10.38 with GFR 4, calcium 7.8, albumin 2.6, lactic acid 3.8, elevated WBC 12.9, and hemoglobin 8.0.  Negative PCR for COVID-19.  Blood cultures pending.  Chest x-ray negative for acute changes.  CT angio abdomen pelvis shows severe acute right renal hemorrhage.  We have been consulted to manage dialysis during this admission.    Objective:  Vital signs in last 24 hours:  Temp:  [94.7 F (34.8 C)-98.2 F (36.8 C)] 98.2 F (36.8 C) (10/30 1115) Pulse Rate:  [36-96] 80 (10/30 1245) Resp:  [12-31] 16 (10/30 1245) BP: (47-132)/(34-88) 114/73 (10/30 1245) SpO2:  [96 %-100 %] 100 % (10/30 1245) Weight:  [57.2 kg] 57.2 kg (10/30 0705)  Weight change:  Filed Weights   11/05/21 0705  Weight: 57.2 kg     Intake/Output: I/O last 3 completed shifts: In: 1235.8 [I.V.:1056.2; IV Piggyback:179.6] Out: -    Intake/Output this shift:  Total I/O In: 574.8 [I.V.:39.6; Blood:523; IV Piggyback:12.2] Out: 0   Physical Exam: General: Ill appearing  Head: Normocephalic, atraumatic. Moist oral mucosal membranes  Eyes: Anicteric  Lungs:  Clear to auscultation, normal effort  Heart: Regular rate and rhythm  Abdomen:  Soft, nontender  Extremities:  No peripheral edema.  Neurologic: Nonfocal, moving all four extremities  Skin: No lesions  Access: Lt Permcath    Basic Metabolic Panel: Recent Labs  Lab 11/05/21 0320  NA 141  K 4.7  CL 107  CO2 19*  GLUCOSE 185*  BUN 65*  CREATININE 10.38*  CALCIUM 7.8*    Liver Function Tests: Recent Labs  Lab 11/05/21 0320  AST 19  ALT 11  ALKPHOS 58  BILITOT 0.5  PROT 5.1*  ALBUMIN 2.7*   No results for input(s): "LIPASE", "AMYLASE" in the last 168 hours. No results for input(s): "AMMONIA" in the last 168 hours.  CBC: Recent Labs  Lab 11/05/21 0320 11/05/21 1003  WBC 12.9*  --   NEUTROABS 9.5*  --   HGB 8.0* 5.8*  HCT 25.1* 17.9*  MCV 87.5  --   PLT 174  --     Cardiac Enzymes: No results for input(s): "CKTOTAL", "CKMB", "CKMBINDEX", "TROPONINI" in the last 168 hours.  BNP: Invalid input(s): "POCBNP"  CBG: Recent Labs  Lab 11/05/21 0643  GLUCAP 135*    Microbiology: Results for orders placed or performed during the hospital encounter of 11/05/21  Blood Culture (routine  x 2)     Status: None (Preliminary result)   Collection Time: 11/05/21  3:20 AM   Specimen: BLOOD  Result Value Ref Range Status   Specimen Description BLOOD RIGHT FA  Final   Special Requests   Final    BOTTLES DRAWN AEROBIC AND ANAEROBIC Blood Culture results may not be optimal due to an inadequate volume of blood received in culture bottles   Culture   Final    NO GROWTH < 12 HOURS Performed at [facility], [address],  [place], [place] [address]    Report Status PENDING  Incomplete  SARS Coronavirus 2 by RT PCR (hospital order, performed in [facility] hospital lab) *cepheid single result test* Anterior Nasal Swab     Status: None   Collection Time: 11/05/21  3:20 AM   Specimen: Anterior Nasal Swab  Result Value Ref Range Status   SARS Coronavirus 2 by RT PCR NEGATIVE NEGATIVE Final    Comment: (NOTE) SARS-CoV-2 target nucleic acids are NOT DETECTED.  The SARS-CoV-2 RNA is generally detectable in upper and lower respiratory specimens during the acute phase of infection. The lowest concentration of SARS-CoV-2 viral copies this assay can detect is 250 copies / mL. A negative result does not preclude SARS-CoV-2 infection and should not be used as the sole basis for treatment or other patient management decisions.  A negative result may occur with improper specimen collection / handling, submission of specimen other than nasopharyngeal swab, presence of viral mutation(s) within the areas targeted by this assay, and inadequate number of viral copies (<250 copies / mL). A negative result must be combined with clinical observations, patient history, and epidemiological information.  Fact Sheet for Patients:   https://www.patel.info/  Fact Sheet for Healthcare Providers: https://hall.com/  This test is not yet approved or  cleared by the [place] FDA and has been authorized for detection and/or diagnosis of SARS-CoV-2 by FDA under an Emergency Use Authorization (EUA).  This EUA will remain in effect (meaning this test can be used) for the duration of the COVID-19 declaration under Section 564(b)(1) of the Act, 21 U.S.C. section 360bbb-3(b)(1), unless the authorization is terminated or revoked sooner.  Performed at [facility], [address] [place], [place] [address]   Blood Culture (routine x 2)     Status: None (Preliminary result)    Collection Time: 11/05/21  3:21 AM   Specimen: BLOOD  Result Value Ref Range Status   Specimen Description BLOOD RIGHT HAND  Final   Special Requests   Final    BOTTLES DRAWN AEROBIC AND ANAEROBIC Blood Culture results may not be optimal due to an inadequate volume of blood received in culture bottles   Culture   Final    NO GROWTH < 12 HOURS Performed at [facility], [address] [place], [place] [address]    Report Status PENDING  Incomplete  MRSA Next Gen by PCR, Nasal     Status: None   Collection Time: 11/05/21  6:57 AM   Specimen: Nasal Mucosa; Nasal Swab  Result Value Ref Range Status   MRSA by PCR Next Gen NOT DETECTED NOT DETECTED Final    Comment: (NOTE) The GeneXpert MRSA Assay (FDA approved for NASAL specimens only), is one component of a comprehensive MRSA colonization surveillance program. It is not intended to diagnose MRSA infection nor to guide or monitor treatment for MRSA infections. Test performance is not FDA approved in patients less than 67 years old. Performed  at [facility], [address] [place], [place] [address]     Coagulation Studies: Recent Labs    11/05/21 0450 11/05/21 1003  LABPROT 74.3* 17.0*  INR 9.2* 1.4*    Urinalysis: No results for input(s): "COLORURINE", "LABSPEC", "PHURINE", "GLUCOSEU", "HGBUR", "BILIRUBINUR", "KETONESUR", "PROTEINUR", "UROBILINOGEN", "NITRITE", "LEUKOCYTESUR" in the last 72 hours.  Invalid input(s): "APPERANCEUR"    Imaging: CT Angio Chest/Abd/Pel for Dissection W and/or W/WO  Result Date: 11/05/2021 CLINICAL DATA:  66 year old female with recurrent syncope, falls. Dialysis patient on warfarin. EXAM: CT ANGIOGRAPHY CHEST, ABDOMEN AND PELVIS TECHNIQUE: Multidetector CT imaging through the chest, abdomen and pelvis was performed using the standard protocol during bolus administration of intravenous contrast. Multiplanar reconstructed images and MIPs were obtained and reviewed to evaluate  the vascular anatomy. RADIATION DOSE REDUCTION: This exam was performed according to the departmental dose-optimization program which includes automated exposure control, adjustment of the mA and/or kV according to patient size and/or use of iterative reconstruction technique. CONTRAST:  144m OMNIPAQUE IOHEXOL 350 MG/ML SOLN COMPARISON:  CTA chest 01/15/2016. CT Abdomen and Pelvis 10/25/2020. FINDINGS: CTA CHEST FINDINGS Cardiovascular: Calcified aortic atherosclerosis. Similar aortic and pulmonary artery contrast timing. Negative for thoracic aortic dissection. Fusiform enlargement of the ascending thoracic aorta up to 42 mm diameter. No pulmonary artery filling defect identified. Calcified coronary artery atherosclerosis. No cardiomegaly. Small simple fluid density anterior pericardial effusion, might be physiologic on series 4, image 61. Mediastinum/Nodes: No mediastinal mass or lymphadenopathy. Lungs/Pleura: Major airways are patent. Lung volumes are stable since 2018. No pneumothorax or consolidation. Some chronic lung scarring. Small layering low-density right pleural effusion, favor transudate. Musculoskeletal: Chronic thoracic kyphoscoliosis. Chronically abnormal bone mineralization compatible with renal osteodystrophy. Thoracic vertebrae appear stable and intact. No sternal fracture identified. Visible shoulder osseous structures appear intact. No acute rib fracture identified. Review of the MIP images confirms the above findings. CTA ABDOMEN AND PELVIS FINDINGS VASCULAR Extensive Aortoiliac calcified atherosclerosis. Negative for abdominal aortic aneurysm or dissection. Major arterial structures in the abdomen and pelvis remain patent. Review of the MIP images confirms the above findings. NON-VASCULAR Hepatobiliary: Liver and gallbladder appear to remain within normal limits, but there is hemoperitoneum around the liver and in the right retroperitoneum adjacent. Pancreas: Within normal limits. Spleen:  Appears to remain normal but there is perisplenic complex fluid/blood. 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 Adrenal glands and left kidney appear stable since 2022. But there is a large acute renal hemorrhage with bulky hematoma in the right pararenal space communicating with the right retroperitoneal and peritoneal spaces. Active contrast extravasation identified on series 13 delayed images in the region of the right lower pole (series 13, images 67 and 69, possibly also the anterior renal mid to lower Pole on image 49. large volume hemoperitoneum and abdominal hematoma. Dominant component of hemorrhage in the right abdomen is at least 600 mL. Additional pelvic hemoperitoneum. Urinary bladder is contracted and compressed. Chronic dystrophic calcifications in the pelvis. Stomach/Bowel: Decompressed large and small bowel throughout the abdomen and pelvis, with mass effect on the right peritoneal structures from abnormal renal and pararenal spaces. Relatively decompressed stomach and duodenum. No free air. Lymphatic: No lymphadenopathy identified. Reproductive: Chronically absent uterus. Diminutive or obscured ovaries. Other: Moderate to large volume hemoperitoneum in the pelvis. Musculoskeletal: Chronic heterogeneous bone mineralization compatible with renal osteodystrophy. Lumbar vertebrae appear stable and intact. Pelvis, SI joints and proximal femurs appear stable and intact. Review of the MIP images confirms the above findings. IMPRESSION: 1. Positive for  Severe Acute Right Renal hemorrhage. Underlying chronic renal disease of dialysis with Active Renal Vessel Contrast Extravasation visible on delayed series 13, and large volume retroperitoneal/intraperitoneal hemorrhage of > 600 mL. This Critical Value/emergent result called by telephone at the time of interpretation on 11/05/2021 at 6:49 am to Dr. Hinda Kehr , who verbally acknowledged these results. 2. No aortic  dissection (but see #3) or other significant acute finding; small right pleural and pericardial effusions appear to be transudate and are of doubtful significance. 3. 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 This recommendation follows 2010 ACCF/AHA/AATS/ACR/ASA/SCA/SCAI/SIR/STS/SVM Guidelines for the Diagnosis and Management of Patients with Thoracic Aortic Disease. Circulation. 2010; 121: G017-C944. Aortic aneurysm NOS (ICD10-I71.9). 4. Chronic renal osteodystrophy, thoracic kyphoscoliosis. Electronically Signed   By: Genevie Ann M.D.   On: 11/05/2021 06:54   CT Cervical Spine Wo Contrast  Result Date: 11/05/2021 CLINICAL DATA:  66 year old female with recurrent syncope, falls. Might have struck head. Chronic tentorial meningioma. Dialysis patient. EXAM: CT CERVICAL SPINE WITHOUT CONTRAST TECHNIQUE: Multidetector CT imaging of the cervical spine was performed without intravenous contrast. Multiplanar CT image reconstructions were also generated. RADIATION DOSE REDUCTION: This exam was performed according to the departmental dose-optimization program which includes automated exposure control, adjustment of the mA and/or kV according to patient size and/or use of iterative reconstruction technique. COMPARISON:  Head CT today reported separately.  Neck CT 05/18/2013. FINDINGS: Alignment: Normal cervical lordosis. Cervicothoracic junction alignment is within normal limits. Bilateral posterior element alignment is within normal limits. Skull base and vertebrae: Visualized skull base is intact. No atlanto-occipital dissociation. C1 and C2 appear intact and aligned. No acute osseous abnormality identified. Soft tissues and spinal canal: No prevertebral fluid or swelling. No visible canal hematoma. Negative visible noncontrast neck soft tissues except for calcified carotid atherosclerosis and left IJ approach dual  lumen dialysis type catheter. There is trace intravenous gas at the thoracic inlet, likely related to recent IV access. Disc levels:  Mild for age cervical spine degeneration. Upper chest: Calcified aortic atherosclerosis. Partially visible upper thoracic scoliosis. Negative lung apices. IMPRESSION: 1. No acute traumatic injury identified in the cervical spine. Mild for age cervical spine degeneration. 2. Aortic Atherosclerosis (ICD10-I70.0). Left-side dialysis catheter. Electronically Signed   By: Genevie Ann M.D.   On: 11/05/2021 04:38   CT Head Wo Contrast  Result Date: 11/05/2021 CLINICAL DATA:  66 year old female with recurrent syncope, falls. Might have struck head. Chronic tentorial meningioma. EXAM: CT HEAD WITHOUT CONTRAST TECHNIQUE: Contiguous axial images were obtained from the base of the skull through the vertex without intravenous contrast. RADIATION DOSE REDUCTION: This exam was performed according to the departmental dose-optimization program which includes automated exposure control, adjustment of the mA and/or kV according to patient size and/or use of iterative reconstruction technique. COMPARISON:  Brain MRI 01/01/2016.  Head CT 07/08/2021. FINDINGS: Brain: Scattered chronic somewhat unusual dural calcifications including adjacent to both anterior clinoid processes. Partially calcified chronic lobulated midline posterior fossa meningioma at the tentorial incisor a measures 24 x 27 x 28 mm and appears 2 mm larger since July. This meningioma was up to 17 mm in 2017. But regional mass effect remains mild and there is no associated cerebral or cerebellar edema. Basilar cisterns remain normal. No superimposed No midline shift, ventriculomegaly, intracranial hemorrhage or evidence of cortically based acute infarction. Gray-white matter differentiation is within normal limits throughout the brain. Vascular: Calcified atherosclerosis at the skull base. No  suspicious intracranial vascular hyperdensity.  Skull: Stable and intact. Sinuses/Orbits: Visualized paranasal sinuses and mastoids are clear. Other: Broad-based right anterior convexity scalp hematoma measures up to 10 mm in thickness. Superimposed scalp vessel calcified atherosclerosis. No scalp soft tissue. Underlying right frontal bone appears stable and intact. Visualized orbit soft tissues are within normal limits. IMPRESSION: 1. Broad-based right anterior convexity scalp hematoma without underlying skull fracture. 2.  No acute traumatic injury identified to the brain 3. But slowly enlarging chronic midline posterior fossa Meningioma, now up to 28 mm long axis versus 17 mm in 2017. No associated edema or significant mass effect. Recommend follow-up with Neurosurgery. Electronically Signed   By: Genevie Ann M.D.   On: 11/05/2021 04:34   DG Chest Port 1 View  Result Date: 11/05/2021 CLINICAL DATA:  Sepsis EXAM: PORTABLE CHEST 1 VIEW COMPARISON:  07/27/2021 FINDINGS: Lungs are clear. No pneumothorax or pleural effusion. Left internal jugular tunnel hemodialysis catheter is seen with its tip within the right atrium. Cardiac size within normal limits. Pulmonary vascularity is normal. No acute bone abnormality. IMPRESSION: 1. No active disease. Electronically Signed   By: Fidela Salisbury M.D.   On: 11/05/2021 04:10     Medications:    sodium chloride 10 mL/hr at 11/05/21 0730   sodium chloride     anticoagulant sodium citrate      ceFAZolin (ANCEF) IV     norepinephrine (LEVOPHED) Adult infusion 18 mcg/min (11/05/21 1302)   vancomycin      sodium chloride   Intravenous Once   Chlorhexidine Gluconate Cloth  6 each Topical Q0600   ondansetron (ZOFRAN) IV  4 mg Intravenous STAT   alteplase, anticoagulant sodium citrate, docusate sodium, fentaNYL (SUBLIMAZE) injection, heparin, lidocaine (PF), lidocaine-prilocaine, mouth rinse, pentafluoroprop-tetrafluoroeth, polyethylene glycol  Assessment/ Plan:  Ms. Arynn Armand is a 66 y.o.  female is a  66 year old African-American female with past medical conditions including hypertension, gout, anxiety with depression, sickle cell trait, systolic heart failure with a EF 40 to 45%, breast cancer with bilateral mastectomy, seizures, and end-stage renal disease on hemodialysis.  Patient presents to the emergency department via EMS for multiple syncopal episodes.  Patient has been admitted for Right flank pain [R10.9] Elevated lactic acid level [R79.89] Hypotension [I95.9] Hypotension, unspecified hypotension type [I95.9] Altered mental status, unspecified altered mental status type [R41.82]  [facility]/MWF/Lt Permcath  End-stage renal disease on hemodialysis.  Will maintain outpatient schedule if possible.  Last treatment completed on Friday at outpatient clinic.  We will schedule treatment today after procedure.  2. Anemia of chronic kidney disease with acute blood loss Lab Results  Component Value Date   HGB 5.8 (L) 11/05/2021  Hemoglobin below desired target.  CT angio abdomen pelvis shows severe hemorrhage at right kidney.  Primary team will manage blood transfusion.  Patient currently awaiting vascular procedure.   3. Secondary Hyperparathyroidism: with outpatient labs: PTH 379, phosphorus 5.7, calcium 9.4 on June 18, 2021.   Lab Results  Component Value Date   PTH 214 (H) 07/08/2021   CALCIUM 7.8 (L) 11/05/2021   PHOS 4.1 07/28/2021    Patient prescribed calcium carbonate, sevelamer and Cinacalcet outpatient.  4.  Hypotension likely due to blood loss, currently receiving pressor support.   LOS: 0 Kai Calico 10/30/20231:56 PM

## 2021-11-05 NOTE — ED Provider Notes (Signed)
[facility] Provider Note    Event Date/Time   First MD Initiated Contact with Patient 11/05/21 0308     (approximate)   History   Loss of Consciousness and Back Pain   HPI Level 5 caveat:  history/ROS limited by acute/critical illness  Teresa Cook is a 66 y.o. female with extensive past medical history that includes but is not limited to end-stage renal disease on hemodialysis with a left-sided catheter placement on her distress, prior history of CHF, DVT, cerebral meningioma, recurrent depression, will breast cancer.  She presents by EMS for evaluation after a fall.  History is very unclear upon arrival.  However, reportedly the patient lives alone and she passed at least 4 times over the night last night.  She got up to go to the bathroom at around 5 PM and thinks that she passed out and hit her head.  She developed some pain in her back that is radiating around to the right lower quadrant.  This seemed to happen after her fall.  She has been compliant with her dialysis and last had dialysis on Friday because she is on the MWF schedule.  She is not having any trouble breathing.  She takes warfarin for anticoagulation.  She is only minimally able to participate in the exam upon arrival.  She is hypotensive and will resist when people tried to do things to her such as started on IV but she will not voluntarily talk with me.  She was conversant with EMS, however, and she will occasionally say something but will not answer questions.     Physical Exam   Triage Vital Signs: ED Triage Vitals  Enc Vitals Group     BP 11/05/21 0313 125/81     Pulse Rate 11/05/21 0313 74     Resp 11/05/21 0345 (!) 25     Temp 11/05/21 0354 97.6 F (36.4 C)     Temp Source 11/05/21 0354 Rectal     SpO2 11/05/21 0313 96 %     Weight --      Height --      Head Circumference --      Peak Flow --      Pain Score 11/05/21 0313 10     Pain Loc --      Pain Edu? --       Excl. in [place]? --     Most recent vital signs: Vitals:   11/05/21 0800 11/05/21 0815  BP: (!) 78/57 (!) 75/59  Pulse: 75 77  Resp: (!) 27 (!) 30  Temp:  (!) 94.7 F (34.8 C)  SpO2: 100% 100%     General: Awake but minimally responsive.  Appears elderly be on her 34 years. CV:  Good peripheral perfusion.  Regular rate and rhythm. Resp:  Normal effort.  Lungs are clear to auscultation. Abd:  No distention.  No tenderness to palpation. Other:  No tenderness to percussion on her flanks.  No tenderness to palpation along her cervical spine.  She has a hematoma on her head but no palpable skull deformities.  Pupils are relatively small bilaterally but equal and reactive to light.  She will occasionally respond particular to painful stimuli but is not voluntarily answering any questions.   ED Results / Procedures / Treatments   Labs (all labs ordered are listed, but only abnormal results are displayed) Labs Reviewed  LACTIC ACID, PLASMA - Abnormal; Notable for the following components:      Result Value  Lactic Acid, Venous 3.8 (*)    All other components within normal limits  LACTIC ACID, PLASMA - Abnormal; Notable for the following components:   Lactic Acid, Venous 2.6 (*)    All other components within normal limits  COMPREHENSIVE METABOLIC PANEL - Abnormal; Notable for the following components:   CO2 19 (*)    Glucose, Bld 185 (*)    BUN 65 (*)    Creatinine, Ser 10.38 (*)    Calcium 7.8 (*)    Total Protein 5.1 (*)    Albumin 2.7 (*)    GFR, Estimated 4 (*)    All other components within normal limits  CBC WITH DIFFERENTIAL/PLATELET - Abnormal; Notable for the following components:   WBC 12.9 (*)    RBC 2.87 (*)    Hemoglobin 8.0 (*)    HCT 25.1 (*)    RDW 17.6 (*)    Neutro Abs 9.5 (*)    Monocytes Absolute 1.1 (*)    Abs Immature Granulocytes 0.08 (*)    All other components within normal limits  BRAIN NATRIURETIC PEPTIDE - Abnormal; Notable for the following  components:   B Natriuretic Peptide 128.8 (*)    All other components within normal limits  PROTIME-INR - Abnormal; Notable for the following components:   Prothrombin Time 74.3 (*)    INR 9.2 (*)    All other components within normal limits  GLUCOSE, CAPILLARY - Abnormal; Notable for the following components:   Glucose-Capillary 135 (*)    All other components within normal limits  SARS CORONAVIRUS 2 BY RT PCR  CULTURE, BLOOD (ROUTINE X 2)  CULTURE, BLOOD (ROUTINE X 2)  MRSA NEXT GEN BY PCR, NASAL  URINE CULTURE  APTT  TSH  T4, FREE  URINALYSIS, COMPLETE (UACMP) WITH MICROSCOPIC  PROTIME-INR  LACTIC ACID, PLASMA  LACTIC ACID, PLASMA  HEMOGLOBIN AND HEMATOCRIT, BLOOD  TYPE AND SCREEN  PREPARE RBC (CROSSMATCH)  PREPARE FRESH FROZEN PLASMA  TROPONIN I (HIGH SENSITIVITY)  TROPONIN I (HIGH SENSITIVITY)     EKG  ED ECG REPORT I, Hinda Kehr, the attending physician, personally viewed and interpreted this ECG.  Date: 11/05/2021 EKG Time: 3:47 AM Rate: 71 Rhythm: normal sinus rhythm with occasional premature supraventricular complexes QRS Axis: normal Intervals: normal ST/T Wave abnormalities: Non-specific ST segment / T-wave changes, but no clear evidence of acute ischemia. Narrative Interpretation: no definitive evidence of acute ischemia; does not meet STEMI criteria.    RADIOLOGY I discussed the case with Dr. Nevada Crane with radiology when he called me to let me know about the reports.  I also personally viewed and interpreted the patient's CTA chest/abdomen/pelvis.  See hospital course for additional details.    PROCEDURES:  Critical Care performed: Yes, see critical care procedure note(s)  .1-3 Lead EKG Interpretation  Performed by: Hinda Kehr, MD Authorized by: Hinda Kehr, MD     Interpretation: normal     ECG rate:  75   ECG rate assessment: normal     Rhythm: sinus rhythm     Ectopy: none     Conduction: normal   .Critical Care  Performed by:  Hinda Kehr, MD Authorized by: Hinda Kehr, MD   Critical care provider statement:    Critical care time (minutes):  60   Critical care time was exclusive of:  Separately billable procedures and treating other patients   Critical care was necessary to treat or prevent imminent or life-threatening deterioration of the following conditions:  Shock and CNS failure or  compromise   Critical care was time spent personally by me on the following activities:  Development of treatment plan with patient or surrogate, evaluation of patient's response to treatment, examination of patient, obtaining history from patient or surrogate, ordering and performing treatments and interventions, ordering and review of laboratory studies, ordering and review of radiographic studies, pulse oximetry, re-evaluation of patient's condition and review of old [place] ED: Medications  vancomycin (VANCOCIN) IVPB 1000 mg/200 mL premix (has no administration in time range)  0.9 %  sodium chloride infusion ( Intravenous Infusion Verify 11/05/21 0730)  norepinephrine (LEVOPHED) '4mg'$  in 278m (0.016 mg/mL) premix infusion (20 mcg/min Intravenous Infusion Verify 11/05/21 0730)  ondansetron (ZOFRAN) injection 4 mg (4 mg Intravenous Not Given 11/05/21 0513)  docusate sodium (COLACE) capsule 100 mg (has no administration in time range)  polyethylene glycol (MIRALAX / GLYCOLAX) packet 17 g (has no administration in time range)  phytonadione (VITAMIN K) 10 mg in dextrose 5 % 50 mL IVPB (has no administration in time range)  0.9 %  sodium chloride infusion (Manually program via Guardrails IV Fluids) (has no administration in time range)  fentaNYL (SUBLIMAZE) injection 12.5-25 mcg (has no administration in time range)  0.9 %  sodium chloride infusion (0 mLs Intravenous Stopped 11/05/21 0507)  ondansetron (ZOFRAN) injection 4 mg (4 mg Intravenous Given 11/05/21 0511)  ceFEPIme (MAXIPIME) 2 g in sodium chloride  0.9 % 100 mL IVPB (0 g Intravenous Stopped 11/05/21 0626)  metroNIDAZOLE (FLAGYL) IVPB 500 mg (0 mg Intravenous Stopped 11/05/21 0713)  iohexol (OMNIPAQUE) 350 MG/ML injection 100 mL (100 mLs Intravenous Contrast Given 11/05/21 0550)  prothrombin complex conc human (KCENTRA) IVPB 2,091 Units (2,091 Units Intravenous New Bag/Given 11/05/21 0749)     IMPRESSION / MDM / [place]/ ED COURSE  I reviewed the triage vital signs and the nursing notes.                              Differential diagnosis includes, but is not limited to, acute intracranial bleed, sepsis, nonspecific shock, ACS, AAS.  Patient's presentation is most consistent with acute presentation with potential threat to life or bodily function.  Labs/studies ordered: CMP, CBC with differential, lactic acid, BNP, COVID-19 PCR, single blood culture, high-sensitivity troponin, pro time-INR, APTT.  I am initially most concerned about the possibility that the patient has an intracranial bleed given her altered mentation and the fact that she reportedly hit her head.  I ordered a CT head and CT cervical spine.  I viewed and interpreted those images and I did not see any evidence of acute intracranial hemorrhage or C-spine fracture.  Radiology confirmed that she has a growing meningioma but no evidence of acute intracranial hemorrhage.  Patient is started wake up a bit more and has no specific complaints at this time, specifically denying chest pain.  She is persistently hypertensive.  She received almost 500 mL of fluid by EMS and I ordered another 1 L LR IV bolus.  Given her hypotension is better to replace the fluids and hold off for fear of a CHF exacerbation or pulmonary edema.  INR is pending.  Lactic acid is elevated at 3.8.  I do not think this is the result of sepsis but may be the result of what ever nonspecific issue or respiratory compromise she is experiencing.  I viewed and interpreted her one-view chest x-ray and I  see  no evidence of pulmonary edema or pneumonia which is reassuring.  The patient does not produce any urine and there is no indication to send a urinalysis.  However her labs are notable for a leukocytosis of 12.9, a persistently low but stable hemoglobin of 8, and normal high-sensitivity troponin.    If her blood pressure does not improve after IV fluid she may require vasopressors.  Either way, although she does not require intubation at this time, she may benefit from ICU admission given the persistent hypotension.  I have no source of infection I highly doubt that this is septic shock but given lack of other source, I may treat empirically.  The patient is on the cardiac monitor to evaluate for evidence of arrhythmia and/or significant heart rate changes.   Clinical Course as of 11/05/21 3016  Mon Nov 05, 2021  0514 In spite of receiving almost a liter and a half of IV fluids, patient's map remains at about 50.  She is a little bit more awake and alert at this time and is able to tell me that she was having pain in her right flank even before she fell.  I ordered a CT chest/abdomen/pelvis without contrast to look for any obvious signs of injury.  However, even though I do not have a specific source, I will treat empirically for possible septic shock even though I still think this may be the result of volume depletion and medication side effect from the Ambien.  I ordered cefepime 2 g IV and vancomycin per pharmacy consult starting with 1 g IV.  I also ordered peripheral Levophed per protocol to try and support her blood pressure.  I will hold off on additional fluid boluses given my concern that this may result in pulmonary edema given her dialysis status.  I consulted by phone with Lisabeth Pick, the ICU NP who is working tonight.  I discussed the case in detail with her and she agrees with the plan and will also follow-up on the CT scans.  After further discussion, we decided to change the  scan to be a CTA chest/abdomen/pelvis with contrast given the concern that she may have either an aortic dissection or aneurysm that could be bleeding leading to her symptoms.  Though she did not initially express it to me, she is now reporting some pain in her back that is radiating through to her abdomen.  I talked with the CT technologist and I have ordered the CTA [CF]  [phone] The patient has gone up to the ICU.  I viewed and interpreted the patient's CTA chest/abdomen/pelvis, and though I do not see an obvious aortic dissection, I do see an abnormality that I cannot identify.  Dr. Nevada Crane with radiology called me and informed me that she has an emergent renal hemorrhage with at least 600 mL of blood and active extravasation.  He recommended emergent embolization and/or nephrectomy.  I called and discussed the case with Rufina Falco in the ICU who has assumed care.  We discussed the results and I offered to call either urology or vascular surgery, but she said that she would call immediately to Dr. Lucky Cowboy with vascular surgery who is coming on service this morning.  She will handle the case from here. [CF]  0700 I also called to discuss the INR results which is notable for being 9.2.  Benjamine Mola is aware [CF]    Clinical Course User Index [CF] Hinda Kehr, MD     FINAL CLINICAL IMPRESSION(S) /  ED DIAGNOSES   Final diagnoses:  Hypotension, unspecified hypotension type  Altered mental status, unspecified altered mental status type  Elevated lactic acid level  Right flank pain  Shock ([place])     Rx / DC Orders   ED Discharge Orders     None        Note:  This document was prepared using Dragon voice recognition software and may include unintentional dictation errors.   Hinda Kehr, MD 11/05/21 [phone]

## 2021-11-05 NOTE — Progress Notes (Signed)
Post hd rn assessment 

## 2021-11-05 NOTE — Op Note (Signed)
[facility]  Percutaneous Study/Intervention Procedural Note    Surgeon(s): [employer]   Assistants: None  Pre-operative Diagnosis: Right renal hemorrhage, ESRD  Post-operative diagnosis:  Same  Procedure(s) Performed:             1.  Ultrasound guidance for vascular access right femoral artery             2.  Catheter placement into 2 main branches of the right renal artery from right femoral approach             3.  Aortogram and selective right renal angiogram             4.  Coil embolization of the 2 main branches of the right renal artery all the way back to the main trunk of the right renal artery with a total of 6 Ruby coils             5.  StarClose closure device right femoral artery              Contrast: 45 cc  EBL: 5 cc  Fluoro Time: 11.1 minutes  Moderate conscious sedation: Approximately 32 minutes with 2 mg of Versed and 75 mcg of Fentanyl  Indications:  The patient is a 66 year old dialysis patient with large volume hemorrhage from the right kidney requiring embolization to stop the life-threatening hemorrhage.  Given the clinical scenario and the noninvasive findings, angiogram is indicated for further evaluation of her renal artery and potential treatment. Risks and benefits are discussed and informed consent is obtained.  Procedure:  The patient was identified and appropriate procedural time out was performed.  The patient was then placed supine on the table and prepped and draped in the usual sterile fashion. Moderate conscious sedation was administered with a face to face encounter with the patient throughout the procedure with my supervision of the RN administering medicines and monitoring the patients vital signs and mental status throughout from the start of the procedure until the patient was taken to the recovery room  Ultrasound was used to evaluate the right common femoral artery.  It was patent .  A digital ultrasound image was  acquired.  A Seldinger needle was used to access the right common femoral artery under direct ultrasound guidance and a permanent image was performed.  A 0.035 J wire was advanced without resistance and a 5Fr sheath was placed.  Pigtail catheter was placed into the aorta at the L1 level and an AP aortogram was performed. This demonstrated calcified aorta and iliac arteries without significant stenosis.  Both renal arteries were patent proximally without main renal artery stenosis in both renal arteries had sluggish and diminished flow distally due to her longstanding renal failure.  I then cannulated the right renal artery with a C2 catheter and selective imaging was performed which did show a blush of the superior pole of the right kidney.  The right kidney was also displaced superior likely due to large hematoma.  I then used a prograde microcatheter and selectively cannulated the lower of the 2 renal artery branches which then fed the area that appeared to have the blush and advanced this about 5 to 7 cm beyond its origin.  A 3 mm diameter by 15 cm length Ruby coil and then a 4 mm diameter by 15 cm length Ruby coil were initially deployed and then an additional 4 mm diameter by 15 cm length soft Ruby coil was taken back to the  origin of this branch.  I then cannulated the more superior of the 2 branches with the prograde microcatheter and after selective imaging is performed to evaluate this I deployed a 4 mm diameter by 15 cm length soft Ruby coil in this branch back to near its origin.  I then used two 5 cm packing coils in the main renal artery just before the branch and just beyond the origin taking care not to have these in the aorta.  Completion imaging was performed through the C2 catheter if the prograde microcatheter was removed and successful embolization of the right kidney including the 2 main primary branches was seen without coils going back into the aorta.  The diagnostic catheter was removed.  Oblique arteriogram was performed of the right femoral artery and StarClose closure device was deployed in the usual fashion with excellent hemostatic result. The patient was taken to the recovery room in stable condition having tolerated the procedure well.  Findings:               Aortogram/Renal Arteries: This demonstrated calcified aorta and iliac arteries without significant stenosis.  Both renal arteries were patent proximally without main renal artery stenosis in both renal arteries had sluggish and diminished flow distally due to her longstanding renal failure.  Selective imaging of the right renal artery showed 2 main branches off of a short trunk which were both successfully coil embolized back to the main trunk.   Condition:  Stable  Complications: None   Leotis Pain 11/05/2021 3:58 PM  This note was created with Dragon Medical transcription system. Any errors in dictation are purely unintentional.

## 2021-11-05 NOTE — Progress Notes (Signed)
Pt BFR 300 due to high VP. Catheter lines reversed. Pt resting, vss, will continue to monitor. HD staff at bedside.

## 2021-11-05 NOTE — Progress Notes (Signed)
PHARMACY -  BRIEF ANTIBIOTIC NOTE   Pharmacy has received consult(s) for vancomycin and cefepime from an ED provider.  The patient's profile has been reviewed for ht/wt/allergies/indication/available labs.    One time order(s) placed for vancomycin 1,000 mg x 1 and cefepime 2 grams x 1  Further antibiotics/pharmacy consults should be ordered by admitting physician if indicated.                       Thank you, Wynelle Cleveland 11/05/2021  5:23 AM

## 2021-11-05 NOTE — Progress Notes (Signed)
Pt did not tolerate HD treatment d/t hypotension, irregular HR and PVCs. Pt responds to voice, c/o of abdominal pain, vs curently stable, report to icu rn. Start: 5501 End: 2005 770m fluid removed 1uprbc administered w/ HD 30.8L BVP

## 2021-11-05 NOTE — Progress Notes (Signed)
eLink Physician-Brief Progress Note Patient Name: Teresa Cook DOB: [birth year] MRN: [id number]   Date of Service  11/05/2021  HPI/Events of Note  CC: Patient lives alone. Patient states she passed out 4 times lastnight. Patient states she was getting up to go to the bathroom at 1700 and passed out and may have hit her head and back. Patient also complains of back pain that radiates to the RLQ. Patient has history of dialysis with treatment on MWF. Patient states no complications at Fridays treatment. Patient takes warfarin.  Now in ICU on levophed gtt.  Camera: MAP 83, sats 100% on nasal o2. HR 63. Getting saline  Data: Reviewed  Septic shock, no clear source yet. Notes, H and P not up yet. S/p 1.5 sepsis fluid bolus, on vanc,cefepime, metronidazole. Asp precautions Trend LA improving. Follow CT abdomen CT angio.   Fall> CTH no ICB  Anemia. Trend Hg Look for any GI bleeding.     eICU Interventions  NP Ouma been notified by ED .      Intervention Category Major Interventions: Sepsis - evaluation and management;Hypotension - evaluation and management Evaluation Type: New Patient Evaluation  Elmer Sow 11/05/2021, 6:51 AM

## 2021-11-05 NOTE — Consult Note (Addendum)
[place] for Post-Kcentra monitoring Indication: Warfarin reversal  Labs: Recent Labs    11/05/21 0320 11/05/21 0450  WBC 12.9*  --   HGB 8.0*  --   HCT 25.1*  --   PLT 174  --   APTT  --  36  CREATININE 10.38*  --   ALBUMIN 2.7*  --   PROT 5.1*  --   AST 19  --   ALT 11  --   ALKPHOS 58  --   BILITOT 0.5  --    Estimated Creatinine Clearance: 4.7 mL/min (A) (by C-G formula based on SCr of 10.38 mg/dL (H)).  Medical History: Past Medical History:  Diagnosis Date   Anemia    Anxiety    Breast cancer ([place]) 01/2016   bilateral   CHF (congestive heart failure) ([place])    Chronic kidney disease    Depression    Dialysis patient [facility])    DVT (deep venous thrombosis) ([place])    left leg   DVT (deep venous thrombosis) ([employer]) 1985   right thigh   Dysrhythmia    Gout    Headache    HTN (hypertension)    Hypertension    Parathyroid abnormality ([facility])    Parathyroid disease ([place])    Pneumonia 12/2015   Psoriasis    Renal insufficiency    Sickle cell trait ([facility])    traits    Medications:  Warfarin prior to admission. 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 Patient subsequently found to have severe acute right renal hemorrhage in setting of supratherapeutic INR on warfarin. Patient ordered emergent reversal with Kcentra and vitamin K.  Goal of Therapy:  INR <= 1.4  Plan:  --Check INR 1 hr after completion of Kcentra infusion and then daily x 2 days --Will notify provider if INR goals not met for consideration for implementation of further reversal strategies  Addendum: Post-Kcentra INR = 1.4. Full reversal achieved.  [name] 11/05/2021,7:49 AM

## 2021-11-06 ENCOUNTER — Inpatient Hospital Stay
Admit: 2021-11-06 | Discharge: 2021-11-06 | Disposition: A | Discharge status: Home / Self Care | Payer: Medicare Other | Attending: Vascular Surgery | Admitting: Vascular Surgery

## 2021-11-06 ENCOUNTER — Encounter: Payer: Self-pay | Admitting: Vascular Surgery

## 2021-11-06 ENCOUNTER — Inpatient Hospital Stay: Payer: Medicare Other

## 2021-11-06 DIAGNOSIS — R7989 Other specified abnormal findings of blood chemistry: Secondary | ICD-10-CM

## 2021-11-06 DIAGNOSIS — I9589 Other hypotension: Secondary | ICD-10-CM | POA: Diagnosis not present

## 2021-11-06 DIAGNOSIS — D631 Anemia in chronic kidney disease: Secondary | ICD-10-CM

## 2021-11-06 DIAGNOSIS — I509 Heart failure, unspecified: Secondary | ICD-10-CM

## 2021-11-06 DIAGNOSIS — I959 Hypotension, unspecified: Secondary | ICD-10-CM | POA: Diagnosis not present

## 2021-11-06 DIAGNOSIS — N186 End stage renal disease: Secondary | ICD-10-CM

## 2021-11-06 DIAGNOSIS — E861 Hypovolemia: Secondary | ICD-10-CM | POA: Diagnosis not present

## 2021-11-06 DIAGNOSIS — Z992 Dependence on renal dialysis: Secondary | ICD-10-CM

## 2021-11-06 DIAGNOSIS — I4891 Unspecified atrial fibrillation: Secondary | ICD-10-CM | POA: Diagnosis not present

## 2021-11-06 DIAGNOSIS — R4182 Altered mental status, unspecified: Secondary | ICD-10-CM

## 2021-11-06 LAB — BASIC METABOLIC PANEL
Anion gap: 12 (ref 5–15)
BUN: 49 mg/dL — ABNORMAL HIGH (ref 8–23)
CO2: 26 mmol/L (ref 22–32)
Calcium: 8.3 mg/dL — ABNORMAL LOW (ref 8.9–10.3)
Chloride: 100 mmol/L (ref 98–111)
Creatinine, Ser: 8.25 mg/dL — ABNORMAL HIGH (ref 0.44–1.00)
GFR, Estimated: 5 mL/min — ABNORMAL LOW (ref >60)
Glucose, Bld: 105 mg/dL — ABNORMAL HIGH (ref 70–99)
Potassium: 4.6 mmol/L (ref 3.5–5.1)
Sodium: 138 mmol/L (ref 135–145)

## 2021-11-06 LAB — HEMOGLOBIN AND HEMATOCRIT, BLOOD
HCT: 19.3 % — ABNORMAL LOW (ref 36.0–46.0)
HCT: 20.6 % — ABNORMAL LOW (ref 36.0–46.0)
HCT: 22.3 % — ABNORMAL LOW (ref 36.0–46.0)
HCT: 22.5 % — ABNORMAL LOW (ref 36.0–46.0)
Hemoglobin: 6.5 g/dL — ABNORMAL LOW (ref 12.0–15.0)
Hemoglobin: 7 g/dL — ABNORMAL LOW (ref 12.0–15.0)
Hemoglobin: 7.6 g/dL — ABNORMAL LOW (ref 12.0–15.0)
Hemoglobin: 7.7 g/dL — ABNORMAL LOW (ref 12.0–15.0)

## 2021-11-06 LAB — PREPARE FRESH FROZEN PLASMA: Unit division: 0

## 2021-11-06 LAB — CBC WITH DIFFERENTIAL/PLATELET
Abs Immature Granulocytes: 0.07 10*3/uL (ref 0.00–0.07)
Basophils Absolute: 0 10*3/uL (ref 0.0–0.1)
Basophils Relative: 0 %
Eosinophils Absolute: 0 10*3/uL (ref 0.0–0.5)
Eosinophils Relative: 0 %
HCT: 21.1 % — ABNORMAL LOW (ref 36.0–46.0)
Hemoglobin: 7.3 g/dL — ABNORMAL LOW (ref 12.0–15.0)
Immature Granulocytes: 1 %
Lymphocytes Relative: 9 %
Lymphs Abs: 1.2 10*3/uL (ref 0.7–4.0)
MCH: 28 pg (ref 26.0–34.0)
MCHC: 34.6 g/dL (ref 30.0–36.0)
MCV: 80.8 fL (ref 80.0–100.0)
Monocytes Absolute: 1.5 10*3/uL — ABNORMAL HIGH (ref 0.1–1.0)
Monocytes Relative: 12 %
Neutro Abs: 10.6 10*3/uL — ABNORMAL HIGH (ref 1.7–7.7)
Neutrophils Relative %: 78 %
Platelets: 73 10*3/uL — ABNORMAL LOW (ref 150–400)
RBC: 2.61 MIL/uL — ABNORMAL LOW (ref 3.87–5.11)
RDW: 16.7 % — ABNORMAL HIGH (ref 11.5–15.5)
Smear Review: NORMAL
WBC: 13.4 10*3/uL — ABNORMAL HIGH (ref 4.0–10.5)
nRBC: 0 % (ref 0.0–0.2)

## 2021-11-06 LAB — PROTIME-INR
INR: 1.4 — ABNORMAL HIGH (ref 0.8–1.2)
Prothrombin Time: 17.3 seconds — ABNORMAL HIGH (ref 11.4–15.2)

## 2021-11-06 LAB — TECHNOLOGIST SMEAR REVIEW
Plt Morphology: NORMAL
RBC MORPHOLOGY: NONE SEEN

## 2021-11-06 LAB — GLUCOSE, CAPILLARY
Glucose-Capillary: 102 mg/dL — ABNORMAL HIGH (ref 70–99)
Glucose-Capillary: 75 mg/dL (ref 70–99)
Glucose-Capillary: 78 mg/dL (ref 70–99)
Glucose-Capillary: 93 mg/dL (ref 70–99)
Glucose-Capillary: 96 mg/dL (ref 70–99)
Glucose-Capillary: 97 mg/dL (ref 70–99)

## 2021-11-06 LAB — CBC
HCT: 20.8 % — ABNORMAL LOW (ref 36.0–46.0)
Hemoglobin: 6.9 g/dL — ABNORMAL LOW (ref 12.0–15.0)
MCH: 28.2 pg (ref 26.0–34.0)
MCHC: 33.2 g/dL (ref 30.0–36.0)
MCV: 84.9 fL (ref 80.0–100.0)
Platelets: 87 10*3/uL — ABNORMAL LOW (ref 150–400)
RBC: 2.45 MIL/uL — ABNORMAL LOW (ref 3.87–5.11)
RDW: 17.1 % — ABNORMAL HIGH (ref 11.5–15.5)
WBC: 13.7 10*3/uL — ABNORMAL HIGH (ref 4.0–10.5)
nRBC: 0 % (ref 0.0–0.2)

## 2021-11-06 LAB — TROPONIN I (HIGH SENSITIVITY)
Troponin I (High Sensitivity): 17 ng/L (ref <18)
Troponin I (High Sensitivity): 16 ng/L (ref <18)
Troponin I (High Sensitivity): 17 ng/L (ref <18)
Troponin I (High Sensitivity): 16 ng/L (ref <18)

## 2021-11-06 LAB — HEMOGLOBIN A1C
Hgb A1c MFr Bld: 5.6 % (ref 4.8–5.6)
Mean Plasma Glucose: 114.02 mg/dL

## 2021-11-06 LAB — MAGNESIUM
Magnesium: 1.8 mg/dL (ref 1.7–2.4)
Magnesium: 1.7 mg/dL (ref 1.7–2.4)

## 2021-11-06 LAB — BPAM FFP
Blood Product Expiration Date: 202311032359
Blood Product Expiration Date: 202311032359
ISSUE DATE / TIME: 202310300807
ISSUE DATE / TIME: 202310300950
Unit Type and Rh: 6200
Unit Type and Rh: 6200

## 2021-11-06 LAB — PHOSPHORUS: Phosphorus: 7.1 mg/dL — ABNORMAL HIGH (ref 2.5–4.6)

## 2021-11-06 LAB — D-DIMER, QUANTITATIVE: D-Dimer, Quant: 16.58 ug/mL-FEU — ABNORMAL HIGH (ref 0.00–0.50)

## 2021-11-06 LAB — PREPARE RBC (CROSSMATCH)

## 2021-11-06 LAB — PROCALCITONIN: Procalcitonin: 2.08 ng/mL

## 2021-11-06 LAB — POTASSIUM: Potassium: 4.7 mmol/L (ref 3.5–5.1)

## 2021-11-06 LAB — LACTIC ACID, PLASMA: Lactic Acid, Venous: 1.2 mmol/L (ref 0.5–1.9)

## 2021-11-06 LAB — FIBRINOGEN: Fibrinogen: 381 mg/dL (ref 210–475)

## 2021-11-06 MED ORDER — SODIUM CHLORIDE 0.9% IV SOLUTION: Freq: Once | INTRAVENOUS | Status: AC

## 2021-11-06 MED ORDER — HYDROMORPHONE HCL 1 MG/ML IJ SOLN
1.0000 mg | INTRAMUSCULAR | Status: DC | PRN
  Administered 2021-11-06 (×2): 1 mg via INTRAVENOUS
  Filled 2021-11-06 (×2): qty 1

## 2021-11-06 MED ORDER — HYDROMORPHONE HCL 1 MG/ML IJ SOLN
1.0000 mg | INTRAMUSCULAR | Status: DC | PRN
  Administered 2021-11-06 – 2021-11-09 (×10): 1 mg via INTRAVENOUS
  Filled 2021-11-06 (×10): qty 1

## 2021-11-06 MED ORDER — RENA-VITE PO TABS
1.0000 | ORAL_TABLET | Freq: Every day | ORAL | Status: DC
  Administered 2021-11-06 – 2021-11-21 (×14): 1 via ORAL
  Filled 2021-11-06 (×16): qty 1

## 2021-11-06 MED ORDER — NEPRO/CARBSTEADY PO LIQD
237.0000 mL | Freq: Three times a day (TID) | ORAL | Status: DC
  Administered 2021-11-07 – 2021-11-19 (×26): 237 mL via ORAL

## 2021-11-06 MED ORDER — AMIODARONE IV BOLUS ONLY 150 MG/100ML
150.0000 mg | Freq: Once | INTRAVENOUS | Status: AC
  Administered 2021-11-06: 150 mg via INTRAVENOUS

## 2021-11-06 MED ORDER — AMIODARONE HCL IN DEXTROSE 360-4.14 MG/200ML-% IV SOLN
60.0000 mg/h | INTRAVENOUS | Status: DC
  Administered 2021-11-06 – 2021-11-07 (×3): 30 mg/h via INTRAVENOUS
  Administered 2021-11-08: 60 mg/h via INTRAVENOUS
  Administered 2021-11-08: 30 mg/h via INTRAVENOUS
  Administered 2021-11-08 – 2021-11-11 (×10): 60 mg/h via INTRAVENOUS
  Filled 2021-11-06 (×16): qty 200

## 2021-11-06 MED ORDER — DEXMEDETOMIDINE HCL IN NACL 400 MCG/100ML IV SOLN
0.4000 ug/kg/h | INTRAVENOUS | Status: DC
  Administered 2021-11-06: 0.4 ug/kg/h via INTRAVENOUS
  Administered 2021-11-07: 0.5 ug/kg/h via INTRAVENOUS
  Administered 2021-11-08: 0.6 ug/kg/h via INTRAVENOUS
  Filled 2021-11-06 (×5): qty 100

## 2021-11-06 MED ORDER — FENTANYL CITRATE PF 50 MCG/ML IJ SOSY
25.0000 ug | PREFILLED_SYRINGE | Freq: Once | INTRAMUSCULAR | Status: AC
  Administered 2021-11-06: 25 ug via INTRAVENOUS
  Filled 2021-11-06: qty 1

## 2021-11-06 MED ORDER — HYDROMORPHONE HCL 1 MG/ML IJ SOLN
1.0000 mg | Freq: Once | INTRAMUSCULAR | Status: AC
  Administered 2021-11-06: 1 mg via INTRAVENOUS
  Filled 2021-11-06: qty 1

## 2021-11-06 MED ORDER — AMIODARONE IV BOLUS ONLY 150 MG/100ML
INTRAVENOUS | Status: AC
  Filled 2021-11-06: qty 100

## 2021-11-06 MED ORDER — AMIODARONE HCL IN DEXTROSE 360-4.14 MG/200ML-% IV SOLN
INTRAVENOUS | Status: AC
  Filled 2021-11-06: qty 200

## 2021-11-06 MED ORDER — AMIODARONE HCL IN DEXTROSE 360-4.14 MG/200ML-% IV SOLN: 60.0000 mg/h | INTRAVENOUS | Status: AC

## 2021-11-06 MED ORDER — PHENYLEPHRINE CONCENTRATED 100MG/250ML (0.4 MG/ML) INFUSION SIMPLE
0.0000 ug/min | INTRAVENOUS | Status: DC
  Administered 2021-11-06: 20 ug/min via INTRAVENOUS
  Filled 2021-11-06: qty 250

## 2021-11-06 NOTE — TOC Initial Note (Signed)
Transition of Care [facility]) - Initial/Assessment Note    Patient Details  Name: Teresa Cook MRN: [id number] Date of Birth: 1955/02/06  Transition of Care [facility]) CM/SW Contact:    Shelbie Hutching, RN Phone Number: 11/06/2021, 12:22 PM  Clinical Narrative:                 Patient admitted to the hospital with hypotension after falling at home, patient had right renal hemorrhage requiring embolization.    Patient is currently in the ICU stepdown status, RNCM met with patient at the bedside, patient's granddaughter is also present.  Introduced self and explained role in [place].   Patient is from home where she lives alone and is independent.  Patient is on dialysis and goes to [facility] [address] MWF, patient drives herself.   Discussed setting up home health services at discharge but patient is not interested in that right now.  TOC will cont to follow.  She is current with [facility] for primary care services and she uses [facility] mail order for prescriptions.    Expected Discharge Plan: Home/Self Care Barriers to Discharge: Continued Medical Work up   Patient Goals and CMS Choice Patient states their goals for this hospitalization and ongoing recovery are:: Patient wants to get back home when medically able      Expected Discharge Plan and Services Expected Discharge Plan: Home/Self Care   Discharge Planning Services: CM Consult   Living arrangements for the past 2 months: Single Family Home                           HH Arranged: Patient Refused HH          Prior Living Arrangements/Services Living arrangements for the past 2 months: Single Family Home Lives with:: Self Patient language and need for interpreter reviewed:: Yes Do you feel safe going back to the place where you live?: Yes      Need for Family Participation in Patient Care: Yes (Comment) Care giver support system in place?: Yes (comment) (daughter, son, granddaughter)   Criminal Activity/Legal  Involvement Pertinent to Current Situation/Hospitalization: No - Comment as needed  Activities of Daily Living      Permission Sought/Granted                  Emotional Assessment Appearance:: Appears stated age Attitude/Demeanor/Rapport: Engaged Affect (typically observed): Accepting Orientation: : Oriented to Self, Oriented to Place, Oriented to  Time, Oriented to Situation Alcohol / Substance Use: Not Applicable Psych Involvement: No (comment)  Admission diagnosis:  Right flank pain [R10.9] Elevated lactic acid level [R79.89] Hypotension [I95.9] Hypotension, unspecified hypotension type [I95.9] Altered mental status, unspecified altered mental status type [R41.82] Patient Active Problem List   Diagnosis Date Noted   Hypotension 11/05/2021   Nocturnal hypoxia 07/30/2021   GERD (gastroesophageal reflux disease) 07/29/2021   Paroxysmal A-fib (HCC) 07/27/2021   Parathyroid disease ([place])    Hypercalcemia    Depression with anxiety    Anemia in ESRD (end-stage renal disease) ([place])    Headache    Meningioma, cerebral (HCC)    Hypokalemia    Myocardial injury    COVID 10/26/2020   Generalized weakness 10/25/2020   COVID-19 virus infection 10/25/2020   DVT (deep venous thrombosis) (HCC)    Chronic systolic CHF (congestive heart failure) (HCC)    Nausea vomiting and diarrhea    HFrEF (heart failure with reduced ejection fraction) ([place])  Acute CHF ([place]) 01/23/2020   CHF (congestive heart failure) ([place]) 01/13/2017   Protein-calorie malnutrition, severe 01/13/2017   History of seizure 11/19/2016   NICM (nonischemic cardiomyopathy) ([place]) 09/12/2016   Troponin I above reference range 09/12/2016   Atypical pneumonia 09/12/2016   Hypertensive emergency 09/12/2016   Pressure injury of skin 09/11/2016   Headache disorder 05/14/2016   Spell of altered cognition 05/14/2016   HTN (hypertension) 02/29/2016   Moderate recurrent major depression ([place]) 02/29/2016   Panic  attacks 02/29/2016   Scalp lesion    Breast cancer ([place]) 01/23/2016   ESRD (end stage renal disease) ([place]) 01/22/2016   Severe anemia 01/01/2016   Seizure ([place]) 12/31/2015   Healthcare-associated pneumonia    Bloodstream infection due to Port-A-Cath, initial encounter    Sepsis ([place]) 12/15/2015   Chest pain 08/31/2015   SIRS (systemic inflammatory response syndrome) ([place]) 08/27/2015   Malignant neoplasm of upper-inner quadrant of left breast in female, 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   Pulmonary edema 07/09/2015   Dizziness 03/03/2015   Seizures ([place]) 03/03/2015   Spells 11/11/2014   Acute respiratory failure with hypoxia ([place]) 09/19/2014   Left ventricular dysfunction 09/06/2014   Acute pulmonary edema ([place]) 08/30/2014   Respiratory failure ([place]) 08/29/2014   Respiratory difficulty 08/29/2014   Acute on chronic systolic CHF (congestive heart failure) ([place]) 05/16/2014   Hypertension 05/16/2014   Renal failure 05/16/2014   Anxiety 05/16/2014   Sickle cell trait ([place]) 03/03/2014   ESRD on dialysis ([place]) 11/02/2013   Dysgeusia 01/27/2013   Weight loss 01/27/2013   Resting tremor 08/13/2012   Depression 04/30/2012   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   Right leg DVT ([place]) 04/30/2012   PCP:  [facility] [place]:   [place], [address] [place] [address] Phone: [phone] Fax: [phone]     Social Determinants of Health (SDOH) Interventions    Readmission Risk Interventions    11/06/2021   12:18 PM 07/30/2021    2:09 PM  Readmission Risk Prevention Plan  Transportation Screening Complete Complete  Medication Review (RN Care Manager) Complete Complete  PCP or Specialist appointment within 3-5 days of discharge Complete Complete  HRI or Home Care Consult Patient refused Complete  SW Recovery Care/Counseling Consult Not Complete   SW  Consult Not Complete Comments NA   Palliative Care Screening Not Applicable Not [place] Not Applicable Not Applicable

## 2021-11-06 NOTE — Progress Notes (Signed)
NAME:  Teresa Cook, MRN:  [id number], DOB:  [birth year], LOS: 1 ADMISSION DATE:  11/05/2021, CONSULTATION DATE:  11/05/2021 REFERRING MD:  Dr. Karma Greaser, CHIEF COMPLAINT:  Back/abdominal pain, loss of consciousness   Brief Pt Description / Synopsis:  66 y.o female with PMH significant for ESRD on HD, A. Fib & DVT on Warfarin , admitted with Hemorrhagic shock due to right renal hemorrhage with large hematoma in setting of supratherapeutic INR (INR >9).  Vascular Surgery consulted, required coil embolization of the right renal artery.  History of Present Illness:  Teresa Cook is a 66 year old female with a past medical history significant for ESRD on hemodialysis with chronic PermCath, CHF, DVT, cerebral meningioma who presents to [facility] ED on 11/05/2021 status post fall and loss of consciousness.  Patient reports she lives alone and passed out at least 4 times overnight, and when going to the bathroom around 5 AM this morning,  she passed out again it and thinks she hit her head.  Following the fall she also reports development of back/flank pain that radiates around to the right lower quadrant.  She reports she has been compliant with her hemodialysis, last session on Friday.  She denies chest pain, shortness of breath.  She reports compliance with her warfarin for anticoagulation.  ED Course: Initial Vital Signs: Temperature 97.6 F rectally, blood pressure 125/81, pulse 74, respiratory rate 25 Significant Labs: Bicarb 19, glucose 185, BUN 65, creatinine 10.38, albumin 2.7, BNP 128, high-sensitivity troponin 11, lactic acid 3.8, WBC 12.9 with neutrophilia, hemoglobin 8.0, hematocrit 25.1, prothrombin time 74.3, INR 9.2, APTT 36, TSH 3.3, free T4 0.84 COVID-19 PCR negative Imaging Chest X-ray>>IMPRESSION: 1. No active disease. CT head without contrast>>IMPRESSION: 1. Broad-based right anterior convexity scalp hematoma without underlying skull fracture. 2.  No acute traumatic injury  identified to the brain 3. But slowly enlarging chronic midline posterior fossa Meningioma, now up to 28 mm long axis versus 17 mm in 2017. No associated edema or significant mass effect. Recommend follow-up with Neurosurgery. CT cervical spine>>IMPRESSION: 1. No acute traumatic injury identified in the cervical spine. Mild for age cervical spine degeneration. 2. Aortic Atherosclerosis (ICD10-I70.0). Left-side dialysis catheter. CTA chest/abdomen/pelvis>>IMPRESSION: 1. Positive for Severe Acute Right Renal hemorrhage. Underlying chronic renal disease of dialysis with Active Renal Vessel Contrast Extravasation visible on delayed series 13, and large volume retroperitoneal/intraperitoneal hemorrhage of > 600 mL. This Critical Value/emergent result called by telephone at the time of interpretation on 11/05/2021 at 6:49 am to Dr. Hinda Kehr , who verbally acknowledged these results. 2. No aortic dissection (but see #3) or other significant acute finding; small right pleural and pericardial effusions appear to be transudate and are of doubtful significance. 3. 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 Medications Administered: Cefepime and vancomycin.  Vitamin K, Kcentra, Levophed  PCCM asked to admit the patient for further work-up and treatment.  Vascular surgery has been consulted.  Please see "significant hospital events" section below for full detailed hospital course.   Pertinent  Medical History   Past Medical History:  Diagnosis Date   Anemia    Anxiety    Breast cancer ([place]) 01/2016   bilateral   CHF (congestive heart failure) (HCC)    Chronic kidney disease    Depression    Dialysis patient [facility])    DVT (deep venous thrombosis) ([place])    left leg   DVT (deep venous thrombosis) ([place]) 1985  right thigh   Dysrhythmia    Gout    Headache    HTN (hypertension)     Hypertension    Parathyroid abnormality (HCC)    Parathyroid disease ([place])    Pneumonia 12/2015   Psoriasis    Renal insufficiency    Sickle cell trait ([place])    traits     Micro Data:  10/30: SARS-CoV-2 PCR>>negative 10/30: Blood culture x2>> 10/30: MRSA PCR>>negative  Antimicrobials:  Cefepime 10/30 x1 dose Flagyl 10/30 x1 dose Vancomycin 10/30 x1 dose  Significant Hospital Events: Including procedures, antibiotic start and stop dates in addition to other pertinent events   10/30: Presented to ED, found to have hemorrhagic shock due to right renal hemorrhage.  Coumadin reversed with Kcentra and Vitamin K.  2 units of FFP and 2 unit of pRBCs administered.  Vascular Surgery performed embolization of right renal artery 10/31: Weaned off pressors, lactic normal. New thrombocytopenia, DIC workup negative.  Continues to complain of significant right sided abdominal/flank pain, will repeat CT Abdomen & Pelvis.  Transfuse 1 unit pRBCs for Hgb 6.5  Interim History / Subjective:  -S/p right renal artery embolization yesterday -Did not tolerate HD yesterday well (hypotension, irregular HR, and PVC's) -Weaned off pressors today, lactic remains normal -Hgb 7 this morning (slowly trending down) and now 6.5 ~ will transfuse 1 unit pRBC's -Platelets decreased today due to 87 from 174 ~ will perform DIC workup ~ negative DIC -Continues to complain of significant right sided abdominal/flank pain (does get relief with dilaudid, no acute as no rigidity or rebound tenderness) ~ will repeat CT abdomen & pelvis to follow up on retroperitoneal bleed   Objective   Blood pressure 106/79, pulse 85, temperature 99.2 F (37.3 C), temperature source Oral, resp. rate 15, weight 57.9 kg, SpO2 99 %.        Intake/Output Summary (Last 24 hours) at 11/06/2021 0751 Last data filed at 11/06/2021 0400 Gross per 24 hour  Intake 1747.92 ml  Output 700 ml  Net 1047.92 ml   Filed Weights   11/05/21 0705  11/06/21 0340  Weight: 57.2 kg 57.9 kg    Examination: General: Acute on chronically ill-appearing female, laying in bed, on nasal cannula, no acute distress (does complain of right abdominal/flank pain) HENT: Small hematoma to the right forehead, normocephalic, neck supple, no JVD Lungs: Clear breath sounds throughout, no wheezing or rales noted, even, nonlabored Cardiovascular: normal rate, irregular rhythm (NSR with occasional pac's), no murmurs, rubs, gallops Abdomen: Soft, slight distention, tender to palpation (especially to right), no rigidity, no guarding or rebound tenderness, bowel sounds hypoactive Extremities: Normal bulk and tone, no deformities, no edema Neuro: Lethargic, arouses easily to voice, oriented x3, moves all extremities to commands, no focal deficits, speech clear, pupils PERRLA GU: Deferred, patient reports she is anuric  Resolved Hospital Problem list     Assessment & Plan:   #Hemorrhagic shock in the setting of right renal hemorrhage ~ RESOLVED #Syncope in setting of shock PMHx: HFpEF, DVT, A. Fib on Warfarin, HTN Echocardiogram 07/29/2021: LVEF 50 to 81%, grade 2 diastolic dysfunction, RV systolic function normal, mildly elevated pulmonary artery systolic pressure, mild to moderate mitral valve regurgitation -CT Head on presentation negative for acute intracranial abnormality -Continuous cardiac monitoring -Maintain MAP >65 -Cautious IV fluids -Transfusions as indicated -Vasopressors as needed to maintain MAP goal ~ weaned off -Lactic acid has normalized -HS Troponin negative x3 -Vascular surgery is following, appreciate input ~ s/p embolization of the right renal artery  10/30 -Repeat Echocardiogram and Carotid US  #Acute blood loss anemia #Supratherapeutic INR ~ IMPROVED #Thrombocytopenia PMHx: Pt on outpatient coumadin for A.fib & DVT -Monitor for S/Sx of bleeding -Trend CBC (H&H q6h) -SCD's for VTE Prophylaxis  -Transfuse for Hgb <7 ~ transfuse  1 unit pRBCs 10/31 -Coumadin reversed with Kcentra, vitamin K, 2 units of FFP -Continue to follow coags and INR -DIC workup negative (normal fibrinogen, no schistocytes)  #ESRD on Hemodialysis (M,W,F) -Monitor I&O's / urinary output -Follow BMP -Ensure adequate renal perfusion -Avoid nephrotoxic agents as able -Replace electrolytes as indicated -Nephrology consulted, appreciate input ~hemodialysis as per nephrology  #Mild Leukocytosis, suspect reactive  -Monitor fever curve -Trend WBC's & Procalcitonin -Follow cultures as above -Holding off on ABX at this time with low threshold to initiate  #Hyperglycemia -CBG's q4h; Target range of 140 to 180 -SSI -Follow ICU Hypo/Hyperglycemia protocol -Hgb A1c ~ 5.6    Pt is critically ill, prognosis is guarded.  High risk for further decompensation, cardiac arrest, and death.  Best Practice (right click and "Reselect all SmartList Selections" daily)   Diet/type: Regular diet DVT prophylaxis: SCD GI prophylaxis: N/A Lines: Central line and yes and it is still needed Foley:  N/A Code Status:  full code Last date of multidisciplinary goals of care discussion [11/06/21]  10/31: Pt updated at bedside. Will update pt's family when they arrive at bedside.  Labs   CBC: Recent Labs  Lab 11/05/21 0320 11/05/21 1003 11/05/21 1816 11/05/21 2050 11/05/21 2353 11/06/21 0436 11/06/21 0552  WBC 12.9*  --   --   --   --  13.7*  --   NEUTROABS 9.5*  --   --   --   --   --   --   HGB 8.0*   < > 6.6* 8.4* 7.7* 6.9* 7.0*  HCT 25.1*   < > 19.6* 25.4* 22.5* 20.8* 20.6*  MCV 87.5  --   --   --   --  84.9  --   PLT 174  --   --   --   --  87*  --    < > = values in this interval not displayed.     Basic Metabolic Panel: Recent Labs  Lab 11/05/21 0320 11/06/21 0436  NA 141 138  K 4.7 4.6  CL 107 100  CO2 19* 26  GLUCOSE 185* 105*  BUN 65* 49*  CREATININE 10.38* 8.25*  CALCIUM 7.8* 8.3*  MG  --  1.8  PHOS  --  7.1*     GFR: Estimated Creatinine Clearance: 6 mL/min (A) (by C-G formula based on SCr of 8.25 mg/dL (H)). Recent Labs  Lab 11/05/21 0320 11/05/21 0450 11/05/21 1003 11/05/21 1322 11/05/21 2050 11/06/21 0436  PROCALCITON  --   --   --   --   --  2.08  WBC 12.9*  --   --   --   --  13.7*  LATICACIDVEN 3.8* 2.6* 4.6* 1.5 1.2  --      Liver Function Tests: Recent Labs  Lab 11/05/21 0320  AST 19  ALT 11  ALKPHOS 58  BILITOT 0.5  PROT 5.1*  ALBUMIN 2.7*    No results for input(s): "LIPASE", "AMYLASE" in the last 168 hours. No results for input(s): "AMMONIA" in the last 168 hours.  ABG    Component Value Date/Time   PHART 7.35 07/27/2021 1218   PCO2ART 41 07/27/2021 1218   PO2ART 274 (H) 07/27/2021 1218   HCO3 22.6  07/27/2021 1218   ACIDBASEDEF 2.9 (H) 07/27/2021 1218   O2SAT 99.9 07/27/2021 1218     Coagulation Profile: Recent Labs  Lab 11/05/21 0450 11/05/21 1003 11/06/21 0436  INR 9.2* 1.4* 1.4*     Cardiac Enzymes: No results for input(s): "CKTOTAL", "CKMB", "CKMBINDEX", "TROPONINI" in the last 168 hours.  HbA1C: Hemoglobin A1C  Date/Time Value Ref Range Status  12/29/2013 05:04 AM 5.0 4.2 - 6.3 % Final    Comment:    The American Diabetes Association recommends that a primary goal of therapy should be <7% and that physicians should reevaluate the treatment regimen in patients with HbA1c values consistently >8%.    Hgb A1c MFr Bld  Date/Time Value Ref Range Status  07/08/2021 05:05 PM 4.9 4.8 - 5.6 % Final    Comment:    (NOTE)         Prediabetes: 5.7 - 6.4         Diabetes: >6.4         Glycemic control for adults with diabetes: <7.0   08/31/2015 04:37 AM  4.0 - 6.0 % Final   UNABLE TO REPORT A1C DUE TO UNKNOWN INTERFERING FACTOR CAUSING THE ANALYTICAL RANGE TO BE OUTSIDE OF ANALYZER RANGE.  SAMPLE SENT TO [facility] FOR AN ALTERNATIVE METHOD.    CBG: Recent Labs  Lab 11/05/21 0643 11/05/21 1930 11/05/21 2350 11/06/21 0339 11/06/21 0749   GLUCAP 135* 95 88 102* 97     Review of Systems:   Positives in BOLD: Gen: Denies fever, chills, weight change, fatigue, night sweats HEENT: Denies blurred vision, double vision, hearing loss, tinnitus, sinus congestion, rhinorrhea, sore throat, neck stiffness, dysphagia PULM: Denies shortness of breath, cough, sputum production, hemoptysis, wheezing CV: Denies chest pain, edema, orthopnea, paroxysmal nocturnal dyspnea, palpitations GI: Denies abdominal/flank pain, nausea, vomiting, diarrhea, hematochezia, melena, constipation, change in bowel habits GU: Denies dysuria, hematuria, polyuria, oliguria, urethral discharge Endocrine: Denies hot or cold intolerance, polyuria, polyphagia or appetite change Derm: Denies rash, dry skin, scaling or peeling skin change Heme: Denies easy bruising, bleeding, bleeding gums Neuro: Denies headache, numbness, weakness, slurred speech, loss of memory or consciousness   Past Medical History:  She,  has a past medical history of Anemia, Anxiety, Breast cancer ([place]) (01/2016), CHF (congestive heart failure) ([place]), Chronic kidney disease, Depression, Dialysis patient ([place]), DVT (deep venous thrombosis) ([place]), DVT (deep venous thrombosis) ([place]) (1985), Dysrhythmia, Gout, Headache, HTN (hypertension), Hypertension, Parathyroid abnormality ([place]), Parathyroid disease ([place]), Pneumonia (12/2015), Psoriasis, Renal insufficiency, and Sickle cell trait ([place]).   Surgical History:   Past Surgical History:  Procedure Laterality Date   ABDOMINAL HYSTERECTOMY  1980   APPENDECTOMY     BREAST BIOPSY Left 10/28/2013   benign   BREAST EXCISIONAL BIOPSY Left 2002   benign   CORONARY ANGIOGRAPHY N/A 04/04/2020   Procedure: CORONARY ANGIOGRAPHY;  Surgeon: Wellington Hampshire, MD;  Location: [place] [facility];  Service: Cardiovascular;  Laterality: N/A;   INSERTION OF DIALYSIS CATHETER  2014   LIPOMA EXCISION N/A 01/23/2016   Procedure: EXCISION LIPOMA;  Surgeon: Hubbard Robinson, MD;  Location: [facility];  Service: General;  Laterality: N/A;   MASTECTOMY W/ SENTINEL NODE BIOPSY Bilateral 01/23/2016   Procedure: bilateral MASTECTOMY WITH  bilateral SENTINEL LYMPH NODE BIOPSY possible left axillary node dissection forehead lipoma removal;  Surgeon: Hubbard Robinson, MD;  Location: [facility];  Service: General;  Laterality: Bilateral;   PARTIAL HYSTERECTOMY     PERIPHERAL VASCULAR CATHETERIZATION N/A 12/25/2015   Procedure:  Dialysis/Perma Catheter Insertion;  Surgeon: Algernon Huxley, MD;  Location: [place] [facility];  Service: Cardiovascular;  Laterality: N/A;   PERIPHERAL VASCULAR CATHETERIZATION Left 01/22/2016   Procedure: Dialysis/Perma Catheter Insertion;  Surgeon: Algernon Huxley, MD;  Location: [place] [facility];  Service: Cardiovascular;  Laterality: Left;   PERIPHERAL VASCULAR CATHETERIZATION N/A 01/26/2016   Procedure: Dialysis/Perma Catheter Insertion;  Surgeon: Katha Cabal, MD;  Location: [place] [facility];  Service: Cardiovascular;  Laterality: N/A;   PORT-A-CATH REMOVAL N/A 12/20/2015   Procedure: REMOVAL PORT-A-CATH;  Surgeon: Hubbard Robinson, MD;  Location: [facility];  Service: General;  Laterality: N/A;  left     PORTACATH PLACEMENT Left 08/21/2015   Procedure: INSERTION PORT-A-CATH;  Surgeon: Hubbard Robinson, MD;  Location: [facility];  Service: General;  Laterality: Left;   REMOVAL OF A DIALYSIS CATHETER  2017   RENAL ANGIOGRAPHY Right 11/05/2021   Procedure: RENAL ANGIOGRAPHY;  Surgeon: Algernon Huxley, MD;  Location: [place] [facility];  Service: Cardiovascular;  Laterality: Right;  with embolization   RIGHT HEART CATH N/A 04/04/2020   Procedure: RIGHT HEART CATH;  Surgeon: Wellington Hampshire, MD;  Location: [place] [facility];  Service: Cardiovascular;  Laterality: N/A;     Social History:   reports that she has never smoked. She has never used smokeless tobacco. She reports that she does not drink alcohol and does not use drugs.    Family History:  Her family history includes Breast cancer (age of onset: 65) in her maternal aunt; CVA in her mother; Cancer (age of onset: 10) in her sister; Diabetes in her sister; Hypertension in her brother, father, mother, and sister; Stroke in her brother and mother.   Allergies Allergies  Allergen Reactions   Gabapentin Other (See Comments)    Seizure   Adhesive [Tape] Itching    Silk tape is ok to use.     Home Medications  Prior to Admission medications   Medication Sig Start Date End Date Taking? Authorizing Provider  albuterol (VENTOLIN HFA) 108 (90 Base) MCG/ACT inhaler Inhale 2 puffs into the lungs every 4 (four) hours as needed for wheezing or shortness of breath. Patient not taking: Reported on 07/27/2021 10/26/20   Sharen Hones, MD  aspirin-acetaminophen-caffeine [facility] MIGRAINE) [phone] MG tablet Take 1-2 tablets by mouth every 6 (six) hours as needed for headache.    [provider]  atorvastatin (LIPITOR) 40 MG tablet Take 1 tablet (40 mg total) by mouth daily. 07/31/21   Loletha Grayer, MD  b complex-vitamin c-folic acid (NEPHRO-VITE) 0.8 MG TABS tablet Take 1 tablet by mouth daily.    [provider]  calcium carbonate (TUMS - DOSED IN MG ELEMENTAL CALCIUM) 500 MG chewable tablet Chew 1 tablet (200 mg of elemental calcium total) by mouth 2 (two) times daily as needed for indigestion or heartburn. 07/30/21   Loletha Grayer, MD  carvedilol (COREG) 12.5 MG tablet Take 1 tablet (12.5 mg total) by mouth 2 (two) times daily. 07/30/21 08/29/21  Loletha Grayer, MD  cinacalcet (SENSIPAR) 60 MG tablet Take 60 mg by mouth daily before lunch.    [provider]  hydrALAZINE (APRESOLINE) 25 MG tablet Take 1 tablet (25 mg total) by mouth 3 (three) times daily. 07/30/21   Loletha Grayer, MD  isosorbide mononitrate (IMDUR) 30 MG 24 hr tablet Take 1 tablet (30 mg total) by mouth daily. 07/31/21   Loletha Grayer, MD  megestrol (MEGACE) 400 MG/10ML  suspension Take 5 mLs (  200 mg total) by mouth daily. 07/30/21   Loletha Grayer, MD  pantoprazole (PROTONIX) 40 MG tablet Take 1 tablet (40 mg total) by mouth 2 (two) times daily. 07/30/21   Loletha Grayer, MD  PARoxetine (PAXIL) 40 MG tablet Take 1 tablet (40 mg total) by mouth daily. 07/30/21 08/29/21  Loletha Grayer, MD  sacubitril-valsartan (ENTRESTO) 24-26 MG Take 1 tablet by mouth 2 (two) times daily. 02/15/21   Kate Sable, MD  sevelamer carbonate (RENVELA) 800 MG tablet Take 3 tablets (2,400 mg total) by mouth with breakfast, with lunch, and with evening meal. One with snacks 07/30/21   Loletha Grayer, MD  traZODone (DESYREL) 50 MG tablet Take 50 mg by mouth at bedtime. 03/01/21   [provider]  warfarin (COUMADIN) 5 MG tablet Take 1-1.5 tablets (5-7.5 mg total) by mouth See admin instructions. Take 1 tablet ('5mg'$ ) by mouth every Monday, Wednesday, Friday, Saturday and Sunday evening and take 1.5  tablets (7.'5mg'$ ) by mouth every Tuesday and Thursday evening 10/26/20   Sharen Hones, MD  zolpidem (AMBIEN) 10 MG tablet Take 0.5 tablets (5 mg total) by mouth at bedtime. 07/30/21   Loletha Grayer, MD     Critical care time: 40 minutes     Darel Hong, AGACNP-BC [facility] Prefer epic messenger for cross cover needs If after hours, please call E-link

## 2021-11-06 NOTE — Progress Notes (Signed)
[facility]  ROUNDING NOTE   Subjective:   Teresa Cook is a 66 year old African-American female with past medical conditions including hypertension, gout, anxiety with depression, sickle cell trait, systolic heart failure with a EF 40 to 45%, breast cancer with bilateral mastectomy, seizures, and end-stage renal disease on hemodialysis.  Patient presents to the emergency department via EMS for multiple syncopal episodes.  Patient has been admitted for Right flank pain [R10.9] Elevated lactic acid level [R79.89] Hypotension [I95.9] Hypotension, unspecified hypotension type [I95.9] Altered mental status, unspecified altered mental status type [R41.82]  Patient is known to our practice and receives outpatient dialysis treatments at [facility] on a MWF schedule, supervised by Dr. Candiss Norse.    Patient seen and evaluated at bedside in ICU Ill-appearing, complaining of abdominal and lower back pain Nursing at bedside providing medication No lower extremity edema Room air   Objective:  Vital signs in last 24 hours:  Temp:  [95.8 F (35.4 C)-99.2 F (37.3 C)] 98.9 F (37.2 C) (10/31 0830) Pulse Rate:  [0-96] 96 (10/31 0900) Resp:  [14-34] 21 (10/31 0900) BP: (74-155)/(57-95) 109/66 (10/31 0900) SpO2:  [98 %-100 %] 98 % (10/31 0900) Weight:  [57.9 kg] 57.9 kg (10/31 0340)  Weight change:  Filed Weights   11/05/21 0705 11/06/21 0340  Weight: 57.2 kg 57.9 kg    Intake/Output: I/O last 3 completed shifts: In: 3035.6 [I.V.:1538.7; Blood:1205; IV Piggyback:291.8] Out: 700 [Other:700]   Intake/Output this shift:  No intake/output data recorded.  Physical Exam: General: Ill appearing, grimacing  Head: Normocephalic, atraumatic. Moist oral mucosal membranes  Eyes: Anicteric  Lungs:  Clear to auscultation, normal effort  Heart: Regular rate and rhythm  Abdomen:  Soft, nontender  Extremities:  No peripheral edema.  Neurologic: Nonfocal, moving all four  extremities  Skin: No lesions  Access: Lt Permcath    Basic Metabolic Panel: Recent Labs  Lab 11/05/21 0320 11/06/21 0436  NA 141 138  K 4.7 4.6  CL 107 100  CO2 19* 26  GLUCOSE 185* 105*  BUN 65* 49*  CREATININE 10.38* 8.25*  CALCIUM 7.8* 8.3*  MG  --  1.8  PHOS  --  7.1*     Liver Function Tests: Recent Labs  Lab 11/05/21 0320  AST 19  ALT 11  ALKPHOS 58  BILITOT 0.5  PROT 5.1*  ALBUMIN 2.7*    No results for input(s): "LIPASE", "AMYLASE" in the last 168 hours. No results for input(s): "AMMONIA" in the last 168 hours.  CBC: Recent Labs  Lab 11/05/21 0320 11/05/21 1003 11/05/21 2050 11/05/21 2353 11/06/21 0435 11/06/21 0436 11/06/21 0552  WBC 12.9*  --   --   --  13.4* 13.7*  --   NEUTROABS 9.5*  --   --   --  10.6*  --   --   HGB 8.0*   < > 8.4* 7.7* 7.3* 6.9* 7.0*  HCT 25.1*   < > 25.4* 22.5* 21.1* 20.8* 20.6*  MCV 87.5  --   --   --  80.8 84.9  --   PLT 174  --   --   --  73* 87*  --    < > = values in this interval not displayed.     Cardiac Enzymes: No results for input(s): "CKTOTAL", "CKMB", "CKMBINDEX", "TROPONINI" in the last 168 hours.  BNP: Invalid input(s): "POCBNP"  CBG: Recent Labs  Lab 11/05/21 0643 11/05/21 1930 11/05/21 2350 11/06/21 0339 11/06/21 0749  GLUCAP 135* 95 88 102*  68     Microbiology: Results for orders placed or performed during the hospital encounter of 11/05/21  Blood Culture (routine x 2)     Status: None (Preliminary result)   Collection Time: 11/05/21  3:20 AM   Specimen: BLOOD  Result Value Ref Range Status   Specimen Description BLOOD RIGHT FA  Final   Special Requests   Final    BOTTLES DRAWN AEROBIC AND ANAEROBIC Blood Culture results may not be optimal due to an inadequate volume of blood received in culture bottles   Culture   Final    NO GROWTH 1 DAY Performed at [facility], [address], [place], [place] [address]    Report Status PENDING  Incomplete  SARS Coronavirus 2  by RT PCR (hospital order, performed in [facility] hospital lab) *cepheid single result test* Anterior Nasal Swab     Status: None   Collection Time: 11/05/21  3:20 AM   Specimen: Anterior Nasal Swab  Result Value Ref Range Status   SARS Coronavirus 2 by RT PCR NEGATIVE NEGATIVE Final    Comment: (NOTE) SARS-CoV-2 target nucleic acids are NOT DETECTED.  The SARS-CoV-2 RNA is generally detectable in upper and lower respiratory specimens during the acute phase of infection. The lowest concentration of SARS-CoV-2 viral copies this assay can detect is 250 copies / mL. A negative result does not preclude SARS-CoV-2 infection and should not be used as the sole basis for treatment or other patient management decisions.  A negative result may occur with improper specimen collection / handling, submission of specimen other than nasopharyngeal swab, presence of viral mutation(s) within the areas targeted by this assay, and inadequate number of viral copies (<250 copies / mL). A negative result must be combined with clinical observations, patient history, and epidemiological information.  Fact Sheet for Patients:   https://www.patel.info/  Fact Sheet for Healthcare Providers: https://hall.com/  This test is not yet approved or  cleared by the [place] FDA and has been authorized for detection and/or diagnosis of SARS-CoV-2 by FDA under an Emergency Use Authorization (EUA).  This EUA will remain in effect (meaning this test can be used) for the duration of the COVID-19 declaration under Section 564(b)(1) of the Act, 21 U.S.C. section 360bbb-3(b)(1), unless the authorization is terminated or revoked sooner.  Performed at [facility], [address] [place], [place] [address]   Blood Culture (routine x 2)     Status: None (Preliminary result)   Collection Time: 11/05/21  3:21 AM   Specimen: BLOOD  Result Value Ref Range Status    Specimen Description BLOOD RIGHT HAND  Final   Special Requests   Final    BOTTLES DRAWN AEROBIC AND ANAEROBIC Blood Culture results may not be optimal due to an inadequate volume of blood received in culture bottles   Culture   Final    NO GROWTH 1 DAY Performed at [facility], [address] [place], [place] [address]    Report Status PENDING  Incomplete  MRSA Next Gen by PCR, Nasal     Status: None   Collection Time: 11/05/21  6:57 AM   Specimen: Nasal Mucosa; Nasal Swab  Result Value Ref Range Status   MRSA by PCR Next Gen NOT DETECTED NOT DETECTED Final    Comment: (NOTE) The GeneXpert MRSA Assay (FDA approved for NASAL specimens only), is one component of a comprehensive MRSA colonization surveillance program. It is not intended to diagnose MRSA infection nor to guide  or monitor treatment for MRSA infections. Test performance is not FDA approved in patients less than 71 years old. Performed at [facility], [address] [place], [place] [address]     Coagulation Studies: Recent Labs    11/05/21 0450 11/05/21 1003 11/06/21 0436  LABPROT 74.3* 17.0* 17.3*  INR 9.2* 1.4* 1.4*     Urinalysis: No results for input(s): "COLORURINE", "LABSPEC", "PHURINE", "GLUCOSEU", "HGBUR", "BILIRUBINUR", "KETONESUR", "PROTEINUR", "UROBILINOGEN", "NITRITE", "LEUKOCYTESUR" in the last 72 hours.  Invalid input(s): "APPERANCEUR"    Imaging: PERIPHERAL VASCULAR CATHETERIZATION  Result Date: 11/05/2021 See surgical note for result.  CT Angio Chest/Abd/Pel for Dissection W and/or W/WO  Result Date: 11/05/2021 CLINICAL DATA:  66 year old female with recurrent syncope, falls. Dialysis patient on warfarin. EXAM: CT ANGIOGRAPHY CHEST, ABDOMEN AND PELVIS TECHNIQUE: Multidetector CT imaging through the chest, abdomen and pelvis was performed using the standard protocol during bolus administration of intravenous contrast. Multiplanar reconstructed images and MIPs were  obtained and reviewed to evaluate the vascular anatomy. RADIATION DOSE REDUCTION: This exam was performed according to the departmental dose-optimization program which includes automated exposure control, adjustment of the mA and/or kV according to patient size and/or use of iterative reconstruction technique. CONTRAST:  173m OMNIPAQUE IOHEXOL 350 MG/ML SOLN COMPARISON:  CTA chest 01/15/2016. CT Abdomen and Pelvis 10/25/2020. FINDINGS: CTA CHEST FINDINGS Cardiovascular: Calcified aortic atherosclerosis. Similar aortic and pulmonary artery contrast timing. Negative for thoracic aortic dissection. Fusiform enlargement of the ascending thoracic aorta up to 42 mm diameter. No pulmonary artery filling defect identified. Calcified coronary artery atherosclerosis. No cardiomegaly. Small simple fluid density anterior pericardial effusion, might be physiologic on series 4, image 61. Mediastinum/Nodes: No mediastinal mass or lymphadenopathy. Lungs/Pleura: Major airways are patent. Lung volumes are stable since 2018. No pneumothorax or consolidation. Some chronic lung scarring. Small layering low-density right pleural effusion, favor transudate. Musculoskeletal: Chronic thoracic kyphoscoliosis. Chronically abnormal bone mineralization compatible with renal osteodystrophy. Thoracic vertebrae appear stable and intact. No sternal fracture identified. Visible shoulder osseous structures appear intact. No acute rib fracture identified. Review of the MIP images confirms the above findings. CTA ABDOMEN AND PELVIS FINDINGS VASCULAR Extensive Aortoiliac calcified atherosclerosis. Negative for abdominal aortic aneurysm or dissection. Major arterial structures in the abdomen and pelvis remain patent. Review of the MIP images confirms the above findings. NON-VASCULAR Hepatobiliary: Liver and gallbladder appear to remain within normal limits, but there is hemoperitoneum around the liver and in the right retroperitoneum adjacent. Pancreas:  Within normal limits. Spleen: Appears to remain normal but there is perisplenic complex fluid/blood. 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 Adrenal glands and left kidney appear stable since 2022. But there is a large acute renal hemorrhage with bulky hematoma in the right pararenal space communicating with the right retroperitoneal and peritoneal spaces. Active contrast extravasation identified on series 13 delayed images in the region of the right lower pole (series 13, images 67 and 69, possibly also the anterior renal mid to lower Pole on image 49. large volume hemoperitoneum and abdominal hematoma. Dominant component of hemorrhage in the right abdomen is at least 600 mL. Additional pelvic hemoperitoneum. Urinary bladder is contracted and compressed. Chronic dystrophic calcifications in the pelvis. Stomach/Bowel: Decompressed large and small bowel throughout the abdomen and pelvis, with mass effect on the right peritoneal structures from abnormal renal and pararenal spaces. Relatively decompressed stomach and duodenum. No free air. Lymphatic: No lymphadenopathy identified. Reproductive: Chronically absent uterus. Diminutive or obscured ovaries. Other: Moderate to large volume hemoperitoneum in the pelvis.  Musculoskeletal: Chronic heterogeneous bone mineralization compatible with renal osteodystrophy. Lumbar vertebrae appear stable and intact. Pelvis, SI joints and proximal femurs appear stable and intact. Review of the MIP images confirms the above findings. IMPRESSION: 1. Positive for Severe Acute Right Renal hemorrhage. Underlying chronic renal disease of dialysis with Active Renal Vessel Contrast Extravasation visible on delayed series 13, and large volume retroperitoneal/intraperitoneal hemorrhage of > 600 mL. This Critical Value/emergent result called by telephone at the time of interpretation on 11/05/2021 at 6:49 am to Dr. Hinda Kehr , who verbally acknowledged  these results. 2. No aortic dissection (but see #3) or other significant acute finding; small right pleural and pericardial effusions appear to be transudate and are of doubtful significance. 3. 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 This recommendation follows 2010 ACCF/AHA/AATS/ACR/ASA/SCA/SCAI/SIR/STS/SVM Guidelines for the Diagnosis and Management of Patients with Thoracic Aortic Disease. Circulation. 2010; 121: N235-T732. Aortic aneurysm NOS (ICD10-I71.9). 4. Chronic renal osteodystrophy, thoracic kyphoscoliosis. Electronically Signed   By: Genevie Ann M.D.   On: 11/05/2021 06:54   CT Cervical Spine Wo Contrast  Result Date: 11/05/2021 CLINICAL DATA:  66 year old female with recurrent syncope, falls. Might have struck head. Chronic tentorial meningioma. Dialysis patient. EXAM: CT CERVICAL SPINE WITHOUT CONTRAST TECHNIQUE: Multidetector CT imaging of the cervical spine was performed without intravenous contrast. Multiplanar CT image reconstructions were also generated. RADIATION DOSE REDUCTION: This exam was performed according to the departmental dose-optimization program which includes automated exposure control, adjustment of the mA and/or kV according to patient size and/or use of iterative reconstruction technique. COMPARISON:  Head CT today reported separately.  Neck CT 05/18/2013. FINDINGS: Alignment: Normal cervical lordosis. Cervicothoracic junction alignment is within normal limits. Bilateral posterior element alignment is within normal limits. Skull base and vertebrae: Visualized skull base is intact. No atlanto-occipital dissociation. C1 and C2 appear intact and aligned. No acute osseous abnormality identified. Soft tissues and spinal canal: No prevertebral fluid or swelling. No visible canal hematoma. Negative visible noncontrast neck soft tissues except for calcified carotid atherosclerosis  and left IJ approach dual lumen dialysis type catheter. There is trace intravenous gas at the thoracic inlet, likely related to recent IV access. Disc levels:  Mild for age cervical spine degeneration. Upper chest: Calcified aortic atherosclerosis. Partially visible upper thoracic scoliosis. Negative lung apices. IMPRESSION: 1. No acute traumatic injury identified in the cervical spine. Mild for age cervical spine degeneration. 2. Aortic Atherosclerosis (ICD10-I70.0). Left-side dialysis catheter. Electronically Signed   By: Genevie Ann M.D.   On: 11/05/2021 04:38   CT Head Wo Contrast  Result Date: 11/05/2021 CLINICAL DATA:  66 year old female with recurrent syncope, falls. Might have struck head. Chronic tentorial meningioma. EXAM: CT HEAD WITHOUT CONTRAST TECHNIQUE: Contiguous axial images were obtained from the base of the skull through the vertex without intravenous contrast. RADIATION DOSE REDUCTION: This exam was performed according to the departmental dose-optimization program which includes automated exposure control, adjustment of the mA and/or kV according to patient size and/or use of iterative reconstruction technique. COMPARISON:  Brain MRI 01/01/2016.  Head CT 07/08/2021. FINDINGS: Brain: Scattered chronic somewhat unusual dural calcifications including adjacent to both anterior clinoid processes. Partially calcified chronic lobulated midline posterior fossa meningioma at the tentorial incisor a measures 24 x 27 x 28 mm and appears 2 mm larger since July. This meningioma was up to 17 mm in 2017. But regional mass effect remains mild and there is no associated cerebral or cerebellar  edema. Basilar cisterns remain normal. No superimposed No midline shift, ventriculomegaly, intracranial hemorrhage or evidence of cortically based acute infarction. Gray-white matter differentiation is within normal limits throughout the brain. Vascular: Calcified atherosclerosis at the skull base. No suspicious  intracranial vascular hyperdensity. Skull: Stable and intact. Sinuses/Orbits: Visualized paranasal sinuses and mastoids are clear. Other: Broad-based right anterior convexity scalp hematoma measures up to 10 mm in thickness. Superimposed scalp vessel calcified atherosclerosis. No scalp soft tissue. Underlying right frontal bone appears stable and intact. Visualized orbit soft tissues are within normal limits. IMPRESSION: 1. Broad-based right anterior convexity scalp hematoma without underlying skull fracture. 2.  No acute traumatic injury identified to the brain 3. But slowly enlarging chronic midline posterior fossa Meningioma, now up to 28 mm long axis versus 17 mm in 2017. No associated edema or significant mass effect. Recommend follow-up with Neurosurgery. Electronically Signed   By: Genevie Ann M.D.   On: 11/05/2021 04:34   DG Chest Port 1 View  Result Date: 11/05/2021 CLINICAL DATA:  Sepsis EXAM: PORTABLE CHEST 1 VIEW COMPARISON:  07/27/2021 FINDINGS: Lungs are clear. No pneumothorax or pleural effusion. Left internal jugular tunnel hemodialysis catheter is seen with its tip within the right atrium. Cardiac size within normal limits. Pulmonary vascularity is normal. No acute bone abnormality. IMPRESSION: 1. No active disease. Electronically Signed   By: Fidela Salisbury M.D.   On: 11/05/2021 04:10     Medications:    sodium chloride Stopped (11/05/21 0939)   anticoagulant sodium citrate     norepinephrine (LEVOPHED) Adult infusion Stopped (11/06/21 0030)    Chlorhexidine Gluconate Cloth  6 each Topical Q0600   alteplase, anticoagulant sodium citrate, docusate sodium, heparin, HYDROmorphone (DILAUDID) injection, lidocaine (PF), lidocaine-prilocaine, mouth rinse, pentafluoroprop-tetrafluoroeth, polyethylene glycol  Assessment/ Plan:  Ms. Raia Amico is a 66 y.o.  female is a 66 year old African-American female with past medical conditions including hypertension, gout, anxiety with depression,  sickle cell trait, systolic heart failure with a EF 40 to 45%, breast cancer with bilateral mastectomy, seizures, and end-stage renal disease on hemodialysis.  Patient presents to the emergency department via EMS for multiple syncopal episodes.  Patient has been admitted for Right flank pain [R10.9] Elevated lactic acid level [R79.89] Hypotension [I95.9] Hypotension, unspecified hypotension type [I95.9] Altered mental status, unspecified altered mental status type [R41.82]  [facility]/MWF/Lt Permcath  End-stage renal disease on hemodialysis.  Will maintain outpatient schedule if possible.  Received shortened treatment yesterday, 2 hours.  Per HD RN note, patient remained hypotensive during treatment, limiting UF to 700 mL.  HD RN also mentioned access issues, will consider Cathflo prior to treatment tomorrow.  We will allow patient to rest today.  Next treatment scheduled for Wednesday.  2. Anemia of chronic kidney disease with acute blood loss Lab Results  Component Value Date   HGB 7.0 (L) 11/06/2021  CT angio abdomen pelvis shows severe hemorrhage at right kidney.  Appreciate vascular surgery performing right renal angiogram with coil embolization to manage hemorrhage.  Patient received 2 units FFP and blood transfusions yesterday.  Hemoglobin remains below target however improved from 5.8.  We will continue to monitor.  We will consider ESA with dialysis treatments.   3. Secondary Hyperparathyroidism: with outpatient labs: PTH 379, phosphorus 5.7, calcium 9.4 on June 18, 2021.   Lab Results  Component Value Date   PTH 214 (H) 07/08/2021   CALCIUM 8.3 (L) 11/06/2021   PHOS 7.1 (H) 11/06/2021    Calcium remains within target however  phosphorus elevated.  Patient prescribed sevelamer with meals outpatient.  4.  Hypotension likely due to blood loss, levo stopped this morning.  Blood pressure stable, 129/61.   LOS: 1 Amaury Kuzel 10/31/20239:55 AM

## 2021-11-06 NOTE — Consult Note (Signed)
MEDICATION RELATED CONSULT NOTE  Pharmacy Consult for Post-Kcentra monitoring Indication: Warfarin reversal  Labs: Recent Labs    11/05/21 0320 11/05/21 0450 11/05/21 1003 11/05/21 2353 11/06/21 0436 11/06/21 0552  WBC 12.9*  --   --   --  13.7*  --   HGB 8.0*  --    < > 7.7* 6.9* 7.0*  HCT 25.1*  --    < > 22.5* 20.8* 20.6*  PLT 174  --   --   --  87*  --   APTT  --  36  --   --   --   --   CREATININE 10.38*  --   --   --  8.25*  --   MG  --   --   --   --  1.8  --   PHOS  --   --   --   --  7.1*  --   ALBUMIN 2.7*  --   --   --   --   --   PROT 5.1*  --   --   --   --   --   AST 19  --   --   --   --   --   ALT 11  --   --   --   --   --   ALKPHOS 58  --   --   --   --   --   BILITOT 0.5  --   --   --   --   --    < > = values in this interval not displayed.    Estimated Creatinine Clearance: 6 mL/min (A) (by C-G formula based on SCr of 8.25 mg/dL (H)).  Medical History: Past Medical History:  Diagnosis Date   Anemia    Anxiety    Breast cancer ([place]) 01/2016   bilateral   CHF (congestive heart failure) ([place])    Chronic kidney disease    Depression    Dialysis patient [facility])    DVT (deep venous thrombosis) ([place])    left leg   DVT (deep venous thrombosis) ([place]) 1985   right thigh   Dysrhythmia    Gout    Headache    HTN (hypertension)    Hypertension    Parathyroid abnormality ([facility])    Parathyroid disease ([place])    Pneumonia 12/2015   Psoriasis    Renal insufficiency    Sickle cell trait ([facility])    traits    Medications:  Warfarin prior to admission. 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 Patient subsequently found to have severe acute right renal hemorrhage in setting  of supratherapeutic INR on warfarin. Patient ordered emergent reversal with Kcentra and vitamin K.  Goal of Therapy:  INR <= 1.4   Date    INR 10/30 (on admission)  9.2  10/30 (1 hour post-Kcentra) 1.4 10/31    1.4  Plan: INR continues to be stable. Full reversal maintained --Check INR x 1 more day --Will notify provider if INR goals not met for consideration for implementation of further reversal strategies   Wynelle Cleveland 11/06/2021,8:34 AM

## 2021-11-06 NOTE — Consult Note (Signed)
Cardiology Consultation   Patient ID: Teresa Cook MRN: [id number]; DOB: Nov 23, 1955  Admit date: 11/05/2021 Date of Consult: 11/06/2021  PCP:  [facility] [place] [facility] Cardiologist:  None       New Consult done by Dr. Saunders Revel Patient Profile:   Teresa Cook is a 66 y.o. female with a hx of end-stage renal disease on hemodialysis, congestive heart failure, atrial fibrillation, DVT, cerebral meningioma, recurrent depression, and breast cancer who is being seen 11/06/2021 for the evaluation of new onset atrial fibrillation RVR and episodic nonsustained ventricular tachycardia at the request of Dr. Mortimer Fries.  History of Present Illness:   Teresa Cook is a 66 year old female with past medical history significant For end-stage renal disease on hemodialysis with chronic permacath, congestive heart failure, DVT, cerebral meningioma atrial fibrillation, and breast cancer.  She presented to the [facility] emergency department 10/26/2021 status post fall and loss of consciousness.  According to records the patient with admission she passed out again and hit her head.  Following the fall she reported development of low back and flank pain that radiated around to the right lower quadrant.  For atrial fibrillation and DVT she is on chronic warfarin therapy. Unfortunately she was found to be in hemorrhagic shock due to right renal hemorrhage with large hematoma present INR of greater than 9. Coumadin was reversed with Kcentra and vitamin K.  2 units of FFP and 2 units of packed RBCs were administered. Vascular surgery was consulted and she required embolization of the right renal artery.  Initial vital signs: Blood pressure 135/81, pulse 74, respirations of 25, temperature 97.6  Pertinent labs: Bicarb 19, glucose 25, BUN 65, creatinine 2.3, 90.7, BNP 120, high-sensitivity troponin 11, lactic acid 3.8, WBC 12.9, hemoglobin 8, hematocrit 24.1, INR 9.2, TSH 2.3,  free T40.4, COVID-19 PCR was negative  Imaging chest x-ray revealed no active disease; CT of the head without contrast performed based right anterior convexity scalp hematoma without underlying skull fracture, no acute traumatic injury identified to the brain, enlarging chronic midline posterior fossa meningioma 28 mm long axis versus 15 mm in 2017 with associated edema or significant mass effect; CT cervical spine showed no acute traumatic injury identified to the cervical spine, mild for age cervical spine degeneration; CT of the chest abdomen and pelvis was positive for severe acute right renal hemorrhage with underlying chronic renal disease on dialysis with active renal.  Contrast extravasation visible, large volume retroperitoneal/intraperitoneal hemorrhage with 600 mLs noted.  Cardiology was subsequently, consulted today for atrial fibrillation RVR with intermittent episodes of nonsustained V. Tach.  Past Medical History:  Diagnosis Date   Anemia    Anxiety    Breast cancer ([place]) 01/2016   bilateral   CHF (congestive heart failure) (HCC)    Chronic kidney disease    Depression    Dialysis patient [facility])    DVT (deep venous thrombosis) ([place])    left leg   DVT (deep venous thrombosis) ([place]) 1985   right thigh   Dysrhythmia    Gout    Headache    HTN (hypertension)    Hypertension    Parathyroid abnormality (HCC)    Parathyroid disease ([place])    Pneumonia 12/2015   Psoriasis    Renal insufficiency    Sickle cell trait ([place])    traits    Past Surgical History:  Procedure Laterality Date   ABDOMINAL HYSTERECTOMY  1980   APPENDECTOMY     BREAST BIOPSY  Left 10/28/2013   benign   BREAST EXCISIONAL BIOPSY Left 2002   benign   CORONARY ANGIOGRAPHY N/A 04/04/2020   Procedure: CORONARY ANGIOGRAPHY;  Surgeon: Wellington Hampshire, MD;  Location: [place] [facility];  Service: Cardiovascular;  Laterality: N/A;   INSERTION OF DIALYSIS CATHETER  2014   LIPOMA EXCISION N/A 01/23/2016    Procedure: EXCISION LIPOMA;  Surgeon: Hubbard Robinson, MD;  Location: [facility];  Service: General;  Laterality: N/A;   MASTECTOMY W/ SENTINEL NODE BIOPSY Bilateral 01/23/2016   Procedure: bilateral MASTECTOMY WITH  bilateral SENTINEL LYMPH NODE BIOPSY possible left axillary node dissection forehead lipoma removal;  Surgeon: Hubbard Robinson, MD;  Location: [facility] OR[facility];  Service: General;  Laterality: Bilateral;   PARTIAL HYSTERECTOMY     PERIPHERAL VASCULAR CATHETERIZATION N/A 12/25/2015   Procedure: Dialysis/Perma Catheter Insertion;  Surgeon: Algernon Huxley, MD;  Location: [place] [facility];  Service: Cardiovascular;  Laterality: N/A;   PERIPHERAL VASCULAR CATHETERIZATION Left 01/22/2016   Procedure: Dialysis/Perma Catheter Insertion;  Surgeon: Algernon Huxley, MD;  Location: [place] [facility];  Service: Cardiovascular;  Laterality: Left;   PERIPHERAL VASCULAR CATHETERIZATION N/A 01/26/2016   Procedure: Dialysis/Perma Catheter Insertion;  Surgeon: Katha Cabal, MD;  Location: [place] [facility];  Service: Cardiovascular;  Laterality: N/A;   PORT-A-CATH REMOVAL N/A 12/20/2015   Procedure: REMOVAL PORT-A-CATH;  Surgeon: Hubbard Robinson, MD;  Location: [facility];  Service: General;  Laterality: N/A;  left     PORTACATH PLACEMENT Left 08/21/2015   Procedure: INSERTION PORT-A-CATH;  Surgeon: Hubbard Robinson, MD;  Location: [facility];  Service: General;  Laterality: Left;   REMOVAL OF A DIALYSIS CATHETER  2017   RENAL ANGIOGRAPHY Right 11/05/2021   Procedure: RENAL ANGIOGRAPHY;  Surgeon: Algernon Huxley, MD;  Location: [place] [facility];  Service: Cardiovascular;  Laterality: Right;  with embolization   RIGHT HEART CATH N/A 04/04/2020   Procedure: RIGHT HEART CATH;  Surgeon: Wellington Hampshire, MD;  Location: [place] [facility];  Service: Cardiovascular;  Laterality: N/A;     Home Medications:  Prior to Admission medications   Medication Sig Start Date End Date Taking? Authorizing  Provider  albuterol (VENTOLIN HFA) 108 (90 Base) MCG/ACT inhaler Inhale 2 puffs into the lungs every 4 (four) hours as needed for wheezing or shortness of breath. Patient not taking: Reported on 07/27/2021 10/26/20   Sharen Hones, MD  aspirin-acetaminophen-caffeine [facility] MIGRAINE) [phone] MG tablet Take 1-2 tablets by mouth every 6 (six) hours as needed for headache.    [provider]  atorvastatin (LIPITOR) 40 MG tablet Take 1 tablet (40 mg total) by mouth daily. 07/31/21   Loletha Grayer, MD  b complex-vitamin c-folic acid (NEPHRO-VITE) 0.8 MG TABS tablet Take 1 tablet by mouth daily.    [provider]  calcium carbonate (TUMS - DOSED IN MG ELEMENTAL CALCIUM) 500 MG chewable tablet Chew 1 tablet (200 mg of elemental calcium total) by mouth 2 (two) times daily as needed for indigestion or heartburn. 07/30/21   Loletha Grayer, MD  carvedilol (COREG) 12.5 MG tablet Take 1 tablet (12.5 mg total) by mouth 2 (two) times daily. 07/30/21 08/29/21  Loletha Grayer, MD  cinacalcet (SENSIPAR) 60 MG tablet Take 60 mg by mouth daily before lunch.    [provider]  hydrALAZINE (APRESOLINE) 25 MG tablet Take 1 tablet (25 mg total) by mouth 3 (three) times daily. 07/30/21   Loletha Grayer, MD  isosorbide mononitrate (IMDUR) 30 MG  24 hr tablet Take 1 tablet (30 mg total) by mouth daily. 07/31/21   Loletha Grayer, MD  megestrol (MEGACE) 400 MG/10ML suspension Take 5 mLs (200 mg total) by mouth daily. 07/30/21   Loletha Grayer, MD  pantoprazole (PROTONIX) 40 MG tablet Take 1 tablet (40 mg total) by mouth 2 (two) times daily. 07/30/21   Loletha Grayer, MD  PARoxetine (PAXIL) 40 MG tablet Take 1 tablet (40 mg total) by mouth daily. 07/30/21 08/29/21  Loletha Grayer, MD  sacubitril-valsartan (ENTRESTO) 24-26 MG Take 1 tablet by mouth 2 (two) times daily. 02/15/21   Kate Sable, MD  sevelamer carbonate (RENVELA) 800 MG tablet Take 3 tablets (2,400 mg total) by mouth with  breakfast, with lunch, and with evening meal. One with snacks 07/30/21   Loletha Grayer, MD  traZODone (DESYREL) 50 MG tablet Take 50 mg by mouth at bedtime. 03/01/21   [provider]  warfarin (COUMADIN) 5 MG tablet Take 1-1.5 tablets (5-7.5 mg total) by mouth See admin instructions. Take 1 tablet ('5mg'$ ) by mouth every Monday, Wednesday, Friday, Saturday and Sunday evening and take 1.5  tablets (7.'5mg'$ ) by mouth every Tuesday and Thursday evening 10/26/20   Sharen Hones, MD  zolpidem (AMBIEN) 10 MG tablet Take 0.5 tablets (5 mg total) by mouth at bedtime. 07/30/21   Loletha Grayer, MD    Inpatient Medications: Scheduled Meds:  Chlorhexidine Gluconate Cloth  6 each Topical Q0600   feeding supplement (NEPRO CARB STEADY)  237 mL Oral TID BM   multivitamin  1 tablet Oral QHS   Continuous Infusions:  amiodarone     sodium chloride Stopped (11/05/21 0939)   amiodarone     amiodarone     amiodarone 150 mg (11/06/21 1500)   anticoagulant sodium citrate     phenylephrine (NEO-SYNEPHRINE) Adult infusion     PRN Meds: amiodarone, alteplase, anticoagulant sodium citrate, docusate sodium, heparin, HYDROmorphone (DILAUDID) injection, lidocaine (PF), lidocaine-prilocaine, mouth rinse, pentafluoroprop-tetrafluoroeth, polyethylene glycol  Allergies:    Allergies  Allergen Reactions   Gabapentin Other (See Comments)    Seizure   Adhesive [Tape] Itching    Silk tape is ok to use.    Social History:   Social History   Socioeconomic History   Marital status: Widowed    Spouse name: Not on file   Number of children: Not on file   Years of education: Not on file   Highest education level: Not on file  Occupational History   Occupation: disabled  Tobacco Use   Smoking status: Never   Smokeless tobacco: Never  Vaping Use   Vaping Use: Never used  Substance and Sexual Activity   Alcohol use: No    Alcohol/week: 0.0 standard drinks of alcohol   Drug use: No   Sexual activity:  Never    Birth control/protection: Surgical  Other Topics Concern   Not on file  Social History Narrative   ** Merged History Encounter **       Social Determinants of Health   Financial Resource Strain: Not on file  Food Insecurity: Not on file  Transportation Needs: Not on file  Physical Activity: Not on file  Stress: Not on file  Social Connections: Not on file  Intimate Partner Violence: Not on file    Family History:    Family History  Problem Relation Age of Onset   Stroke Mother    CVA Mother    Hypertension Mother    Hypertension Father    Hypertension Sister  Diabetes Sister    Cancer Sister 64       Breast   Stroke Brother    Hypertension Brother    Breast cancer Maternal Aunt 70     ROS:  Please see the history of present illness.  Review of Systems  Constitutional:  Positive for malaise/fatigue.  Respiratory:  Positive for shortness of breath.   Gastrointestinal:  Positive for abdominal pain.  Genitourinary:  Positive for flank pain.  Musculoskeletal:  Positive for back pain.  Neurological:  Positive for dizziness.    All other ROS reviewed and negative.     Physical Exam/Data:   Vitals:   11/06/21 1430 11/06/21 1433 11/06/21 1440 11/06/21 1448  BP: 1'06/69 94/63 99/71 '$   Pulse: (!) 103 (!) 103 94   Resp: (!) 23 (!) 23 20   Temp:    99.3 F (37.4 C)  TempSrc:    Oral  SpO2: 98% 99% 96%   Weight:        Intake/Output Summary (Last 24 hours) at 11/06/2021 1506 Last data filed at 11/06/2021 1448 Gross per 24 hour  Intake 1257.75 ml  Output 700 ml  Net 557.75 ml      11/06/2021    3:40 AM 11/05/2021    7:05 AM 07/30/2021    8:00 AM  Last 3 Weights  Weight (lbs) 127 lb 10.3 oz 126 lb 1.7 oz 127 lb 13.9 oz  Weight (kg) 57.9 kg 57.2 kg 58 kg     Body mass index is 21.24 kg/m.  General:  Well nourished, well developed, in no acute distress, visibly fatigued HEENT: normal Neck: no JVD Vascular: No carotid bruits; Distal pulses 2+  bilaterally Cardiac:  normal S1, S2; RRR; no murmur  Lungs:  clear to auscultation bilaterally, no wheezing, rhonchi or rales  Abd: soft, diffuse tenderness, no hepatomegaly  Ext: no edema Musculoskeletal:  No deformities, BUE and BLE strength normal and equal Skin: warm and dry  Neuro:  CNs 2-12 intact, no focal abnormalities noted Psych:  Normal affect   EKG:  The EKG was personally reviewed and demonstrates: COVID-19/20/23 revealed sinus rhythm with rate of 67 with PACs Telemetry:  Telemetry was personally reviewed and demonstrates:  atrial fibrillation with rates 120-150  Relevant CV Studies: TTE 07/29/21 1. Left ventricular ejection fraction, by estimation, is 50 to 55%. The  left ventricle has low normal function. Left ventricular endocardial  border not optimally defined to evaluate regional wall motion. There is  moderate concentric left ventricular  hypertrophy. Left ventricular diastolic parameters are consistent with  Grade II diastolic dysfunction (pseudonormalization).   2. Right ventricular systolic function is normal. The right ventricular  size is normal. There is mildly elevated pulmonary artery systolic  pressure.   3. Left atrial size was mildly dilated.   4. The mitral valve is normal in structure. Mild to moderate mitral valve  regurgitation. No evidence of mitral stenosis.   5. The aortic valve is calcified. There is severe calcifcation of the  aortic valve. Aortic valve regurgitation is trivial. Aortic valve  sclerosis/calcification is present, without any evidence of aortic  stenosis.   6. The inferior vena cava is normal in size with <50% respiratory  variability, suggesting right atrial pressure of 8 mmHg.   Laboratory Data:  High Sensitivity Troponin:   Recent Labs  Lab 11/05/21 0320 11/05/21 0450 11/06/21 0821 11/06/21 0950  TROPONINIHS '11 10 17 16     '$ Chemistry Recent Labs  Lab 11/05/21 0320 11/06/21 0436  NA 141 138  K 4.7 4.6  CL 107  100  CO2 19* 26  GLUCOSE 185* 105*  BUN 65* 49*  CREATININE 10.38* 8.25*  CALCIUM 7.8* 8.3*  MG  --  1.8  GFRNONAA 4* 5*  ANIONGAP 15 12    Recent Labs  Lab 11/05/21 0320  PROT 5.1*  ALBUMIN 2.7*  AST 19  ALT 11  ALKPHOS 58  BILITOT 0.5   Lipids No results for input(s): "CHOL", "TRIG", "HDL", "LABVLDL", "LDLCALC", "CHOLHDL" in the last 168 hours.  Hematology Recent Labs  Lab 11/05/21 0320 11/05/21 1003 11/06/21 0435 11/06/21 0436 11/06/21 0552 11/06/21 0950  WBC 12.9*  --  13.4* 13.7*  --   --   RBC 2.87*  --  2.61* 2.45*  --   --   HGB 8.0*   < > 7.3* 6.9* 7.0* 6.5*  HCT 25.1*   < > 21.1* 20.8* 20.6* 19.3*  MCV 87.5  --  80.8 84.9  --   --   MCH 27.9  --  28.0 28.2  --   --   MCHC 31.9  --  34.6 33.2  --   --   RDW 17.6*  --  16.7* 17.1*  --   --   PLT 174  --  73* 87*  --   --    < > = values in this interval not displayed.   Thyroid  Recent Labs  Lab 11/05/21 0658  TSH 3.354  FREET4 0.84    BNP Recent Labs  Lab 11/05/21 0320  BNP 128.8*    DDimer  Recent Labs  Lab 11/06/21 0950  DDIMER 16.58*     Radiology/Studies:  US Carotid Bilateral  Result Date: 11/06/2021 CLINICAL DATA:  66 year old female with history of syncope. EXAM: BILATERAL CAROTID DUPLEX ULTRASOUND TECHNIQUE: Pearline Cables scale imaging, color Doppler and duplex ultrasound were performed of bilateral carotid and vertebral arteries in the neck. COMPARISON:  None Available. FINDINGS: Criteria: Quantification of carotid stenosis is based on velocity parameters that correlate the residual internal carotid diameter with NASCET-based stenosis levels, using the diameter of the distal internal carotid lumen as the denominator for stenosis measurement. The following velocity measurements were obtained: RIGHT ICA: Peak systolic velocity 329 cm/sec, End diastolic velocity 45 cm/sec CCA: Peak systolic velocity 924 cm/sec SYSTOLIC ICA/CCA RATIO:  1.2 ECA: Peak systolic velocity 72 cm/sec LEFT ICA: Peak  systolic velocity 268 cm/sec, End diastolic velocity 57 cm/sec CCA: 341 cm/sec SYSTOLIC ICA/CCA RATIO:  1.3 ECA: 75 cm/sec RIGHT CAROTID ARTERY: Mild multifocal atherosclerotic plaque formation. No significant tortuosity. Normal low resistance waveforms. RIGHT VERTEBRAL ARTERY:  Antegrade flow. LEFT CAROTID ARTERY: Mild multifocal atherosclerotic plaque formation. No significant tortuosity. Normal low resistance waveforms. LEFT VERTEBRAL ARTERY:  Antegrade flow. Upper extremity non-invasive blood pressures: Not obtained. IMPRESSION: 1. Right carotid artery system: Less than 50% stenosis secondary to mild multifocal atherosclerotic plaque formation. 2. Left carotid artery system: Less than 50% stenosis secondary to mild multifocal atherosclerotic plaque formation. 3.  Vertebral artery system: Patent with antegrade flow bilaterally. Ruthann Cancer, MD [facility] Radiology Electronically Signed   By: Ruthann Cancer M.D.   On: 11/06/2021 14:27   CT ABDOMEN PELVIS WO CONTRAST  Result Date: 11/06/2021 CLINICAL DATA:  Retroperitoneal hematoma, follow up EXAM: CT ABDOMEN AND PELVIS WITHOUT CONTRAST TECHNIQUE: Multidetector CT imaging of the abdomen and pelvis was performed following the standard protocol without IV contrast. RADIATION DOSE REDUCTION: This exam was performed according to the departmental  dose-optimization program which includes automated exposure control, adjustment of the mA and/or kV according to patient size and/or use of iterative reconstruction technique. COMPARISON:  CTA 11/05/2021. FINDINGS: Lower chest: Small bilateral pleural effusions with adjacent atelectasis, slightly increased from prior exam. Partially visualized ascending aortic aneurysm, see recent CTA of the chest 11/05/2021. Hepatobiliary: No focal liver abnormality is seen. The gallbladder is distended with vicarious excretion of contrast. Pancreas: No ductal dilation or peripancreatic  inflammatory change. Spleen: Normal in size without focal abnormality. Adrenals/Urinary Tract: Adrenal glands are unremarkable. Acquired cystic renal disease of dialysis. There is a right renal and pararenal retroperitoneal hematoma, which appears more hyperdense in areas in comparison to recent CT, likely reflecting developing clot. There is intraperitoneal hemorrhage as well with blood products in the pericolic gutters, perihepatic, perisplenic spaces, as well as in the pelvis. Overall volume of abdominopelvic hemoperitoneum similar to recent CT. Stomach/Bowel: The stomach is within normal limits. There is no evidence of bowel obstruction.Prior appendectomy, per medical record. Vascular/Lymphatic: Aorta iliac atherosclerosis. Interval coil embolization of the right renal vasculature. Reproductive: Prior hysterectomy. Other: There is a pressure dressing applied to the right groin. There is a left femoral angio catheter with tip terminating a left common iliac vein. Musculoskeletal: No acute osseous abnormality. No suspicious osseous lesion. Renal osteodystrophy. IMPRESSION: Evolving right-sided retroperitoneal hematoma, with overall similar volume of hemoperitoneum in comparison to recent CT. Interval right renal vascular coil embolization. Small bilateral pleural effusions with adjacent atelectasis, slightly increased from prior exam. 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 Electronically Signed   By: Maurine Simmering M.D.   On: 11/06/2021 13:47   DG Abd 1 View  Result Date: 11/06/2021 CLINICAL DATA:  Abdominal pain EXAM: ABDOMEN - 1 VIEW COMPARISON:  11/05/2021 FINDINGS: Left femoral catheter is identified with tip projecting over the lower abdomen just below the level of the aortic bifurcation. Embolization coils are noted within the right upper abdomen. The bowel gas pattern appears nonobstructed. There are a few prominent air-filled loops of bowel noted in the right lower quadrant of  the abdomen. Gas and stool noted within the colon up to the level of the rectum. IMPRESSION: 1. Nonobstructive bowel gas pattern. 2. Left femoral catheter tip projects over the lower abdomen just below the level of the aortic bifurcation. Electronically Signed   By: Kerby Moors M.D.   On: 11/06/2021 10:44   PERIPHERAL VASCULAR CATHETERIZATION  Result Date: 11/05/2021 See surgical note for result.  CT Angio Chest/Abd/Pel for Dissection W and/or W/WO  Result Date: 11/05/2021 CLINICAL DATA:  66 year old female with recurrent syncope, falls. Dialysis patient on warfarin. EXAM: CT ANGIOGRAPHY CHEST, ABDOMEN AND PELVIS TECHNIQUE: Multidetector CT imaging through the chest, abdomen and pelvis was performed using the standard protocol during bolus administration of intravenous contrast. Multiplanar reconstructed images and MIPs were obtained and reviewed to evaluate the vascular anatomy. RADIATION DOSE REDUCTION: This exam was performed according to the departmental dose-optimization program which includes automated exposure control, adjustment of the mA and/or kV according to patient size and/or use of iterative reconstruction technique. CONTRAST:  147m OMNIPAQUE IOHEXOL 350 MG/ML SOLN COMPARISON:  CTA chest 01/15/2016. CT Abdomen and Pelvis 10/25/2020. FINDINGS: CTA CHEST FINDINGS Cardiovascular: Calcified aortic atherosclerosis. Similar aortic and pulmonary artery contrast timing. Negative for thoracic aortic dissection. Fusiform enlargement of the ascending thoracic aorta up to 42 mm diameter. No pulmonary artery filling defect identified. Calcified coronary artery atherosclerosis. No cardiomegaly. Small simple fluid density anterior pericardial effusion, might be physiologic on  series 4, image 61. Mediastinum/Nodes: No mediastinal mass or lymphadenopathy. Lungs/Pleura: Major airways are patent. Lung volumes are stable since 2018. No pneumothorax or consolidation. Some chronic lung scarring. Small layering  low-density right pleural effusion, favor transudate. Musculoskeletal: Chronic thoracic kyphoscoliosis. Chronically abnormal bone mineralization compatible with renal osteodystrophy. Thoracic vertebrae appear stable and intact. No sternal fracture identified. Visible shoulder osseous structures appear intact. No acute rib fracture identified. Review of the MIP images confirms the above findings. CTA ABDOMEN AND PELVIS FINDINGS VASCULAR Extensive Aortoiliac calcified atherosclerosis. Negative for abdominal aortic aneurysm or dissection. Major arterial structures in the abdomen and pelvis remain patent. Review of the MIP images confirms the above findings. NON-VASCULAR Hepatobiliary: Liver and gallbladder appear to remain within normal limits, but there is hemoperitoneum around the liver and in the right retroperitoneum adjacent. Pancreas: Within normal limits. Spleen: Appears to remain normal but there is perisplenic complex fluid/blood. 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 Adrenal glands and left kidney appear stable since 2022. But there is a large acute renal hemorrhage with bulky hematoma in the right pararenal space communicating with the right retroperitoneal and peritoneal spaces. Active contrast extravasation identified on series 13 delayed images in the region of the right lower pole (series 13, images 67 and 69, possibly also the anterior renal mid to lower Pole on image 49. large volume hemoperitoneum and abdominal hematoma. Dominant component of hemorrhage in the right abdomen is at least 600 mL. Additional pelvic hemoperitoneum. Urinary bladder is contracted and compressed. Chronic dystrophic calcifications in the pelvis. Stomach/Bowel: Decompressed large and small bowel throughout the abdomen and pelvis, with mass effect on the right peritoneal structures from abnormal renal and pararenal spaces. Relatively decompressed stomach and duodenum. No free air.  Lymphatic: No lymphadenopathy identified. Reproductive: Chronically absent uterus. Diminutive or obscured ovaries. Other: Moderate to large volume hemoperitoneum in the pelvis. Musculoskeletal: Chronic heterogeneous bone mineralization compatible with renal osteodystrophy. Lumbar vertebrae appear stable and intact. Pelvis, SI joints and proximal femurs appear stable and intact. Review of the MIP images confirms the above findings. IMPRESSION: 1. Positive for Severe Acute Right Renal hemorrhage. Underlying chronic renal disease of dialysis with Active Renal Vessel Contrast Extravasation visible on delayed series 13, and large volume retroperitoneal/intraperitoneal hemorrhage of > 600 mL. This Critical Value/emergent result called by telephone at the time of interpretation on 11/05/2021 at 6:49 am to Dr. Hinda Kehr , who verbally acknowledged these results. 2. No aortic dissection (but see #3) or other significant acute finding; small right pleural and pericardial effusions appear to be transudate and are of doubtful significance. 3. 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 This recommendation follows 2010 ACCF/AHA/AATS/ACR/ASA/SCA/SCAI/SIR/STS/SVM Guidelines for the Diagnosis and Management of Patients with Thoracic Aortic Disease. Circulation. 2010; 121: I680-H212. Aortic aneurysm NOS (ICD10-I71.9). 4. Chronic renal osteodystrophy, thoracic kyphoscoliosis. Electronically Signed   By: Genevie Ann M.D.   On: 11/05/2021 06:54   CT Cervical Spine Wo Contrast  Result Date: 11/05/2021 CLINICAL DATA:  66 year old female with recurrent syncope, falls. Might have struck head. Chronic tentorial meningioma. Dialysis patient. EXAM: CT CERVICAL SPINE WITHOUT CONTRAST TECHNIQUE: Multidetector CT imaging of the cervical spine was performed without intravenous contrast. Multiplanar CT image reconstructions were also  generated. RADIATION DOSE REDUCTION: This exam was performed according to the departmental dose-optimization program which includes automated exposure control, adjustment of the mA and/or kV according to patient size and/or use of iterative reconstruction technique. COMPARISON:  Head CT today reported separately.  Neck CT 05/18/2013. FINDINGS: Alignment: Normal cervical lordosis. Cervicothoracic junction alignment is within normal limits. Bilateral posterior element alignment is within normal limits. Skull base and vertebrae: Visualized skull base is intact. No atlanto-occipital dissociation. C1 and C2 appear intact and aligned. No acute osseous abnormality identified. Soft tissues and spinal canal: No prevertebral fluid or swelling. No visible canal hematoma. Negative visible noncontrast neck soft tissues except for calcified carotid atherosclerosis and left IJ approach dual lumen dialysis type catheter. There is trace intravenous gas at the thoracic inlet, likely related to recent IV access. Disc levels:  Mild for age cervical spine degeneration. Upper chest: Calcified aortic atherosclerosis. Partially visible upper thoracic scoliosis. Negative lung apices. IMPRESSION: 1. No acute traumatic injury identified in the cervical spine. Mild for age cervical spine degeneration. 2. Aortic Atherosclerosis (ICD10-I70.0). Left-side dialysis catheter. Electronically Signed   By: Genevie Ann M.D.   On: 11/05/2021 04:38   CT Head Wo Contrast  Result Date: 11/05/2021 CLINICAL DATA:  66 year old female with recurrent syncope, falls. Might have struck head. Chronic tentorial meningioma. EXAM: CT HEAD WITHOUT CONTRAST TECHNIQUE: Contiguous axial images were obtained from the base of the skull through the vertex without intravenous contrast. RADIATION DOSE REDUCTION: This exam was performed according to the departmental dose-optimization program which includes automated exposure control, adjustment of the mA and/or kV according to  patient size and/or use of iterative reconstruction technique. COMPARISON:  Brain MRI 01/01/2016.  Head CT 07/08/2021. FINDINGS: Brain: Scattered chronic somewhat unusual dural calcifications including adjacent to both anterior clinoid processes. Partially calcified chronic lobulated midline posterior fossa meningioma at the tentorial incisor a measures 24 x 27 x 28 mm and appears 2 mm larger since July. This meningioma was up to 17 mm in 2017. But regional mass effect remains mild and there is no associated cerebral or cerebellar edema. Basilar cisterns remain normal. No superimposed No midline shift, ventriculomegaly, intracranial hemorrhage or evidence of cortically based acute infarction. Gray-white matter differentiation is within normal limits throughout the brain. Vascular: Calcified atherosclerosis at the skull base. No suspicious intracranial vascular hyperdensity. Skull: Stable and intact. Sinuses/Orbits: Visualized paranasal sinuses and mastoids are clear. Other: Broad-based right anterior convexity scalp hematoma measures up to 10 mm in thickness. Superimposed scalp vessel calcified atherosclerosis. No scalp soft tissue. Underlying right frontal bone appears stable and intact. Visualized orbit soft tissues are within normal limits. IMPRESSION: 1. Broad-based right anterior convexity scalp hematoma without underlying skull fracture. 2.  No acute traumatic injury identified to the brain 3. But slowly enlarging chronic midline posterior fossa Meningioma, now up to 28 mm long axis versus 17 mm in 2017. No associated edema or significant mass effect. Recommend follow-up with Neurosurgery. Electronically Signed   By: Genevie Ann M.D.   On: 11/05/2021 04:34   DG Chest Port 1 View  Result Date: 11/05/2021 CLINICAL DATA:  Sepsis EXAM: PORTABLE CHEST 1 VIEW COMPARISON:  07/27/2021 FINDINGS: Lungs are clear. No pneumothorax or pleural effusion. Left internal jugular tunnel hemodialysis catheter is seen with its  tip within the right atrium. Cardiac size within normal limits. Pulmonary vascularity is normal. No acute bone abnormality. IMPRESSION: 1. No active disease. Electronically Signed   By: Fidela Salisbury M.D.   On: 11/05/2021 04:10     Assessment and Plan:   Atrial fibrillation RVR with episodic nonsustained V. Tach -currently atrial fibrillation on bedside cardiac monitor -Continue amiodarone ordered by CCM -echocardiogram repeated this morning -blood pressures have been soft, phenylephrine  ordered to maintain MAP >87mHg -hs troponin 16 -mg 1.7, recommend keeping closer to 2 -potassium 4.7 -thyroid panel pending -unable to start on anticoagulants due to recent right renal hemorrhage -continue cardiac monitoring -Previously had been on warfarin therapy with INR management by nephrology  Hemorrhagic shock in the setting of right renal hemorrhage with syncope and collapse -Lactic acid is normalized -hgb 6.5 -currently being transfused PRBC's -Maintain MAP greater than equal to 65 mmHg -Vascular surgery continues to follow status post embolization of right renal artery on 11/05/2021  HFpEF -Last echocardiogram done 07/29/2021 revealed LVEF of 50-55% with G2 DD, mildly elevated pulmonary artery systolic pressure, mild to moderate mitral valve regurgitation -Repeat echocardiogram completed today pending results -High-sensitivity troponin negative x3 previously repeat today remains negative -Remain cautious with IV fluids -Daily weights, I's and O's  Acute blood loss anemia in the setting of a suprtherapeutic INR -Hemoglobin 6.5 currently being transfused PRBCs -Routine monitoring of H&H -Transfuse hemoglobin 7 or less, ideally would like to see hemoglobin around 8 -INR 1.4 -Continue monitor for active signs and symptoms of bleeding -DIC work-up negative  5.   End-stage renal disease on hemodialysis -Monitor I's and O's and urine output -Daily BMP -Maintain MAP greater than equal to  65 mmHg for adequate perfusion using vasopressors if needed -Monitor/trend/replete electrolytes as needed -Nephrology following and managing hemodialysis   Risk Assessment/Risk Scores:        New York Heart Association (NYHA) Functional Class NYHA Class II  CHA2DS2-VASc Score =     This indicates a  % annual risk of stroke. The patient's score is based upon:           For questions or updates, please contact [place] consult [email or web address] for contact info under    Signed, Airabella Barley, NP  11/06/2021 3:06 PM

## 2021-11-06 NOTE — Consult Note (Signed)
PHARMACY CONSULT NOTE  Pharmacy Consult for Electrolyte Monitoring and Replacement   Recent Labs: Potassium (mmol/L)  Date Value  11/06/2021 4.6  02/03/2014 4.0   Magnesium (mg/dL)  Date Value  11/06/2021 1.8   Calcium (mg/dL)  Date Value  11/06/2021 8.3 (L)   Calcium, Total (mg/dL)  Date Value  02/03/2014 8.6   Albumin (g/dL)  Date Value  11/05/2021 2.7 (L)  02/02/2014 3.5   Phosphorus (mg/dL)  Date Value  11/06/2021 7.1 (H)  01/17/2014 4.9   Sodium (mmol/L)  Date Value  11/06/2021 138  02/29/2020 140  02/03/2014 138   Corrected Calcium: 9.3 mg/dL  Assessment: Patient is a 66 y/o F with medical history including depression / anxiety, DVT, 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 Patient subsequently found to have severe acute right renal hemorrhage in setting of supratherapeutic INR on warfarin. Patient ordered emergent reversal with Kcentra and vitamin K. Pharmacy consulted to assist with electrolyte monitoring and replacement as indicated.  Hyperphosphatemia d/t renal osteodystrophy- managed outpatient.   Goal of Therapy:  Within normal limits  Plan:  --No electrolyte replacement indicated at this time --Follow-up electrolytes with AM labs tomorrow  Wynelle Cleveland 11/06/2021 8:36 AM

## 2021-11-06 NOTE — Progress Notes (Addendum)
BRIEF PCCM NOTE   Pt developed A.fib with RVR with intermittent nonsustained runs of V-tach (HR ranging from 140-170).  BP soft,  SBP ranging from 80-90, continues to mentate, no chest pain or palpitations. Does report slight SOB and slight dizziness.  Today's Vitals   11/06/21 1430 11/06/21 1433 11/06/21 1440 11/06/21 1448  BP: 1'06/69 94/63 99/71 '$   Pulse: (!) 103 (!) 103 94   Resp: (!) 23 (!) 23 20   Temp:    99.3 F (37.4 C)  TempSrc:    Oral  SpO2: 98% 99% 96%   Weight:      PainSc:       Body mass index is 21.24 kg/m.   ASSESSMENT / PLAN  A.fib w/ RVR Intermittent runs of V-tach Hypotension: Cardiogenic +/- Hemorrhagic shock -Continuous cardiac monitoring -Maintain MAP >65 -Vasopressors as needed to maintain MAP goal  ~ will use Neosynephrine if needed -Trend HS Troponin until peaked -Echocardiogram obtained, results pending -Amiodarone gtt and bolus (unable to use BB or CCB due to hypotension) -Check Troponin, electrolytes, and TSH -Unable to anticoagulate due to recent right renal hemorrhage on presentation -Consult Cardiology, appreciate input ~ notified Dr. Saunders Revel via [place]    Pt's daughter updated at bedside.   Additional critical care time: 20 minutes  Darel Hong, AGACNP-BC [facility] Prefer epic messenger for cross cover needs If after hours, please call E-link

## 2021-11-06 NOTE — Progress Notes (Signed)
*  PRELIMINARY RESULTS* Echocardiogram 2D Echocardiogram has been performed.  Teresa Cook 11/06/2021, 10:14 AM

## 2021-11-07 ENCOUNTER — Other Ambulatory Visit: Payer: Self-pay | Admitting: Pulmonary Disease

## 2021-11-07 DIAGNOSIS — Z515 Encounter for palliative care: Secondary | ICD-10-CM | POA: Diagnosis not present

## 2021-11-07 DIAGNOSIS — I953 Hypotension of hemodialysis: Secondary | ICD-10-CM | POA: Diagnosis not present

## 2021-11-07 DIAGNOSIS — I959 Hypotension, unspecified: Secondary | ICD-10-CM | POA: Diagnosis not present

## 2021-11-07 DIAGNOSIS — Z7189 Other specified counseling: Secondary | ICD-10-CM | POA: Diagnosis not present

## 2021-11-07 DIAGNOSIS — D62 Acute posthemorrhagic anemia: Secondary | ICD-10-CM

## 2021-11-07 DIAGNOSIS — I9589 Other hypotension: Secondary | ICD-10-CM | POA: Diagnosis not present

## 2021-11-07 DIAGNOSIS — I4891 Unspecified atrial fibrillation: Secondary | ICD-10-CM | POA: Diagnosis not present

## 2021-11-07 LAB — GLUCOSE, CAPILLARY
Glucose-Capillary: 89 mg/dL (ref 70–99)
Glucose-Capillary: 85 mg/dL (ref 70–99)
Glucose-Capillary: 100 mg/dL — ABNORMAL HIGH (ref 70–99)
Glucose-Capillary: 63 mg/dL — ABNORMAL LOW (ref 70–99)
Glucose-Capillary: 66 mg/dL — ABNORMAL LOW (ref 70–99)
Glucose-Capillary: 120 mg/dL — ABNORMAL HIGH (ref 70–99)
Glucose-Capillary: 114 mg/dL — ABNORMAL HIGH (ref 70–99)

## 2021-11-07 LAB — ECHOCARDIOGRAM COMPLETE
AR max vel: 1.94 cm2
AV Area VTI: 1.93 cm2
AV Area mean vel: 2.02 cm2
AV Mean grad: 5.3 mmHg
AV Peak grad: 10.2 mmHg
Ao pk vel: 1.59 m/s
Area-P 1/2: 4.29 cm2
S' Lateral: 2.8 cm
Weight: 2042.34 oz

## 2021-11-07 LAB — PROTIME-INR
INR: 1.5 — ABNORMAL HIGH (ref 0.8–1.2)
Prothrombin Time: 18 seconds — ABNORMAL HIGH (ref 11.4–15.2)

## 2021-11-07 LAB — HEMOGLOBIN AND HEMATOCRIT, BLOOD
HCT: 20 % — ABNORMAL LOW (ref 36.0–46.0)
HCT: 20.2 % — ABNORMAL LOW (ref 36.0–46.0)
HCT: 20.3 % — ABNORMAL LOW (ref 36.0–46.0)
HCT: 20.7 % — ABNORMAL LOW (ref 36.0–46.0)
Hemoglobin: 6.9 g/dL — ABNORMAL LOW (ref 12.0–15.0)
Hemoglobin: 7 g/dL — ABNORMAL LOW (ref 12.0–15.0)
Hemoglobin: 7.1 g/dL — ABNORMAL LOW (ref 12.0–15.0)
Hemoglobin: 7.2 g/dL — ABNORMAL LOW (ref 12.0–15.0)

## 2021-11-07 LAB — RENAL FUNCTION PANEL
Albumin: 2.8 g/dL — ABNORMAL LOW (ref 3.5–5.0)
Anion gap: 12 (ref 5–15)
BUN: 63 mg/dL — ABNORMAL HIGH (ref 8–23)
CO2: 24 mmol/L (ref 22–32)
Calcium: 8.8 mg/dL — ABNORMAL LOW (ref 8.9–10.3)
Chloride: 99 mmol/L (ref 98–111)
Creatinine, Ser: 10.1 mg/dL — ABNORMAL HIGH (ref 0.44–1.00)
GFR, Estimated: 4 mL/min — ABNORMAL LOW (ref >60)
Glucose, Bld: 97 mg/dL (ref 70–99)
Phosphorus: 8.6 mg/dL — ABNORMAL HIGH (ref 2.5–4.6)
Potassium: 5.2 mmol/L — ABNORMAL HIGH (ref 3.5–5.1)
Sodium: 135 mmol/L (ref 135–145)

## 2021-11-07 LAB — CBC WITH DIFFERENTIAL/PLATELET
Abs Immature Granulocytes: 0.23 10*3/uL — ABNORMAL HIGH (ref 0.00–0.07)
Basophils Absolute: 0 10*3/uL (ref 0.0–0.1)
Basophils Relative: 0 %
Eosinophils Absolute: 0 10*3/uL (ref 0.0–0.5)
Eosinophils Relative: 0 %
HCT: 20.3 % — ABNORMAL LOW (ref 36.0–46.0)
Hemoglobin: 6.9 g/dL — ABNORMAL LOW (ref 12.0–15.0)
Immature Granulocytes: 2 %
Lymphocytes Relative: 6 %
Lymphs Abs: 0.8 10*3/uL (ref 0.7–4.0)
MCH: 28.2 pg (ref 26.0–34.0)
MCHC: 34 g/dL (ref 30.0–36.0)
MCV: 82.9 fL (ref 80.0–100.0)
Monocytes Absolute: 1.5 10*3/uL — ABNORMAL HIGH (ref 0.1–1.0)
Monocytes Relative: 10 %
Neutro Abs: 11.8 10*3/uL — ABNORMAL HIGH (ref 1.7–7.7)
Neutrophils Relative %: 82 %
Platelets: 81 10*3/uL — ABNORMAL LOW (ref 150–400)
RBC: 2.45 MIL/uL — ABNORMAL LOW (ref 3.87–5.11)
RDW: 15.9 % — ABNORMAL HIGH (ref 11.5–15.5)
WBC: 14.4 10*3/uL — ABNORMAL HIGH (ref 4.0–10.5)
nRBC: 0 % (ref 0.0–0.2)

## 2021-11-07 LAB — MAGNESIUM: Magnesium: 1.9 mg/dL (ref 1.7–2.4)

## 2021-11-07 LAB — PREPARE RBC (CROSSMATCH)

## 2021-11-07 LAB — THYROID PANEL WITH TSH
Free Thyroxine Index: 1.1 — ABNORMAL LOW (ref 1.2–4.9)
T3 Uptake Ratio: 31 % (ref 24–39)
T4, Total: 3.5 ug/dL — ABNORMAL LOW (ref 4.5–12.0)
TSH: 2.08 u[IU]/mL (ref 0.450–4.500)

## 2021-11-07 MED ORDER — OXYCODONE HCL 5 MG PO TABS
5.0000 mg | ORAL_TABLET | Freq: Four times a day (QID) | ORAL | Status: DC | PRN
  Administered 2021-11-08 (×2): 5 mg via ORAL
  Filled 2021-11-07 (×3): qty 1

## 2021-11-07 MED ORDER — SODIUM CHLORIDE 0.9% IV SOLUTION: Freq: Once | INTRAVENOUS | Status: AC

## 2021-11-07 MED ORDER — DEXTROSE 50 % IV SOLN: 12.5000 g | INTRAVENOUS | Status: AC

## 2021-11-07 MED ORDER — ACETAMINOPHEN 325 MG PO TABS
650.0000 mg | ORAL_TABLET | Freq: Four times a day (QID) | ORAL | Status: DC | PRN
  Administered 2021-11-07 – 2021-11-20 (×4): 650 mg via ORAL
  Filled 2021-11-07 (×5): qty 2

## 2021-11-07 MED ORDER — HEPARIN SODIUM (PORCINE) 1000 UNIT/ML IJ SOLN
INTRAMUSCULAR | Status: AC
  Filled 2021-11-07: qty 10

## 2021-11-07 MED ORDER — PIPERACILLIN-TAZOBACTAM IN DEX 2-0.25 GM/50ML IV SOLN
2.2500 g | Freq: Three times a day (TID) | INTRAVENOUS | Status: DC
  Administered 2021-11-07 – 2021-11-13 (×17): 2.25 g via INTRAVENOUS
  Filled 2021-11-07 (×20): qty 50

## 2021-11-07 MED ORDER — DEXTROSE 50 % IV SOLN
INTRAVENOUS | Status: AC
  Administered 2021-11-07: 50 mL
  Filled 2021-11-07: qty 50

## 2021-11-07 NOTE — Consult Note (Addendum)
PHARMACY CONSULT NOTE  Pharmacy Consult for Electrolyte Monitoring and Replacement   Recent Labs: Potassium (mmol/L)  Date Value  11/07/2021 5.2 (H)  02/03/2014 4.0   Magnesium (mg/dL)  Date Value  11/07/2021 1.9   Calcium (mg/dL)  Date Value  11/07/2021 8.8 (L)   Calcium, Total (mg/dL)  Date Value  02/03/2014 8.6   Albumin (g/dL)  Date Value  11/07/2021 2.8 (L)  02/02/2014 3.5   Phosphorus (mg/dL)  Date Value  11/07/2021 8.6 (H)  01/17/2014 4.9   Sodium (mmol/L)  Date Value  11/07/2021 135  02/29/2020 140  02/03/2014 138   Corrected Calcium: 9.3 mg/dL  Assessment: Patient is a 66 y/o F with medical history including depression / anxiety, DVT, 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 Patient subsequently found to have severe acute right renal hemorrhage in setting of supratherapeutic INR on warfarin. Patient ordered emergent reversal with Kcentra and vitamin K. Pharmacy consulted to assist with electrolyte monitoring and replacement as indicated.  Hyperphosphatemia d/t renal osteodystrophy- managed outpatient.   Goal of Therapy:  Within normal limits  Plan:  --No electrolyte replacement indicated at this time. Defer to nephrology (plan was for HD MWF) --Follow-up electrolytes with AM labs tomorrow  Wynelle Cleveland 11/07/2021 8:45 AM

## 2021-11-07 NOTE — Progress Notes (Signed)
[facility]  ROUNDING NOTE   Subjective:   Teresa Cook is a 66 year old African-American female with past medical conditions including hypertension, gout, anxiety with depression, sickle cell trait, systolic heart failure with a EF 40 to 45%, breast cancer with bilateral mastectomy, seizures, and end-stage renal disease on hemodialysis.  Patient presents to the emergency department via EMS for multiple syncopal episodes.  Patient has been admitted for Right flank pain [R10.9] Elevated lactic acid level [R79.89] Hypotension [I95.9] Hypotension, unspecified hypotension type [I95.9] Altered mental status, unspecified altered mental status type [R41.82]  Patient is known to our practice and receives outpatient dialysis treatments at [facility] on a MWF schedule, supervised by Dr. Candiss Norse.    Patient seen resting quietly, alert and oriented States she feels mildly better than yesterday Continues to complain of right flank to back pain Appetite remains poor Voices concerns about amount of fluid that she is receiving with medications and treatment plan for dialysis   Objective:  Vital signs in last 24 hours:  Temp:  [98.2 F (36.8 C)-100.4 F (38 C)] 98.5 F (36.9 C) (11/01 1040) Pulse Rate:  [51-158] 82 (11/01 1215) Resp:  [12-34] 31 (11/01 1215) BP: (70-129)/(51-99) 114/69 (11/01 1215) SpO2:  [74 %-100 %] 94 % (11/01 1215) Weight:  [59.9 kg-61.6 kg] 61.6 kg (11/01 1015)  Weight change: 2.7 kg Filed Weights   11/06/21 0340 11/07/21 0500 11/07/21 1015  Weight: 57.9 kg 59.9 kg 61.6 kg    Intake/Output: I/O last 3 completed shifts: In: 1775.9 [P.O.:50; I.V.:907.9; Blood:718; IV Piggyback:100] Out: 700 [Other:700]   Intake/Output this shift:  Total I/O In: 169.9 [P.O.:60; I.V.:109.9] Out: -   Physical Exam: General: Ill appearing  Head: Normocephalic, atraumatic. Moist oral mucosal membranes  Eyes: Anicteric  Lungs:  Diminished in bases, shallow  breaths  Heart: Regular rate and rhythm  Abdomen:  Soft, nontender  Extremities:  No peripheral edema.  Neurologic: Nonfocal, moving all four extremities  Skin: No lesions  Access: Lt Permcath    Basic Metabolic Panel: Recent Labs  Lab 11/05/21 0320 11/06/21 0436 11/06/21 1526 11/07/21 0512  NA 141 138  --  135  K 4.7 4.6 4.7 5.2*  CL 107 100  --  99  CO2 19* 26  --  24  GLUCOSE 185* 105*  --  97  BUN 65* 49*  --  63*  CREATININE 10.38* 8.25*  --  10.10*  CALCIUM 7.8* 8.3*  --  8.8*  MG  --  1.8 1.7 1.9  PHOS  --  7.1*  --  8.6*     Liver Function Tests: Recent Labs  Lab 11/05/21 0320 11/07/21 0512  AST 19  --   ALT 11  --   ALKPHOS 58  --   BILITOT 0.5  --   PROT 5.1*  --   ALBUMIN 2.7* 2.8*    No results for input(s): "LIPASE", "AMYLASE" in the last 168 hours. No results for input(s): "AMMONIA" in the last 168 hours.  CBC: Recent Labs  Lab 11/05/21 0320 11/05/21 1003 11/06/21 0435 11/06/21 0436 11/06/21 0552 11/06/21 0950 11/06/21 1859 11/06/21 2357 11/07/21 0512 11/07/21 0612  WBC 12.9*  --  13.4* 13.7*  --   --   --   --  14.4*  --   NEUTROABS 9.5*  --  10.6*  --   --   --   --   --  11.8*  --   HGB 8.0*   < > 7.3* 6.9*   < >  6.5* 7.6* 7.2* 6.9* 7.0*  HCT 25.1*   < > 21.1* 20.8*   < > 19.3* 22.3* 20.7* 20.3* 20.2*  MCV 87.5  --  80.8 84.9  --   --   --   --  82.9  --   PLT 174  --  73* 87*  --   --   --   --  81*  --    < > = values in this interval not displayed.     Cardiac Enzymes: No results for input(s): "CKTOTAL", "CKMB", "CKMBINDEX", "TROPONINI" in the last 168 hours.  BNP: Invalid input(s): "POCBNP"  CBG: Recent Labs  Lab 11/06/21 1923 11/06/21 2346 11/07/21 0354 11/07/21 0733 11/07/21 1113  GLUCAP 78 93 89 85 100*     Microbiology: Results for orders placed or performed during the hospital encounter of 11/05/21  Blood Culture (routine x 2)     Status: None (Preliminary result)   Collection Time: 11/05/21  3:20 AM    Specimen: BLOOD  Result Value Ref Range Status   Specimen Description BLOOD RIGHT FA  Final   Special Requests   Final    BOTTLES DRAWN AEROBIC AND ANAEROBIC Blood Culture results may not be optimal due to an inadequate volume of blood received in culture bottles   Culture   Final    NO GROWTH 2 DAYS Performed at [facility], [address] [place], [place] [address]    Report Status PENDING  Incomplete  SARS Coronavirus 2 by RT PCR (hospital order, performed in [place] hospital lab) *cepheid single result test* Anterior Nasal Swab     Status: None   Collection Time: 11/05/21  3:20 AM   Specimen: Anterior Nasal Swab  Result Value Ref Range Status   SARS Coronavirus 2 by RT PCR NEGATIVE NEGATIVE Final    Comment: (NOTE) SARS-CoV-2 target nucleic acids are NOT DETECTED.  The SARS-CoV-2 RNA is generally detectable in upper and lower respiratory specimens during the acute phase of infection. The lowest concentration of SARS-CoV-2 viral copies this assay can detect is 250 copies / mL. A negative result does not preclude SARS-CoV-2 infection and should not be used as the sole basis for treatment or other patient management decisions.  A negative result may occur with improper specimen collection / handling, submission of specimen other than nasopharyngeal swab, presence of viral mutation(s) within the areas targeted by this assay, and inadequate number of viral copies (<250 copies / mL). A negative result must be combined with clinical observations, patient history, and epidemiological information.  Fact Sheet for Patients:   https://www.patel.info/  Fact Sheet for Healthcare Providers: https://hall.com/  This test is not yet approved or  cleared by the [place] FDA and has been authorized for detection and/or diagnosis of SARS-CoV-2 by FDA under an Emergency Use Authorization (EUA).  This EUA will remain in effect  (meaning this test can be used) for the duration of the COVID-19 declaration under Section 564(b)(1) of the Act, 21 U.S.C. section 360bbb-3(b)(1), unless the authorization is terminated or revoked sooner.  Performed at [facility], [address] [place], [place] [address]   Blood Culture (routine x 2)     Status: None (Preliminary result)   Collection Time: 11/05/21  3:21 AM   Specimen: BLOOD  Result Value Ref Range Status   Specimen Description BLOOD RIGHT HAND  Final   Special Requests   Final    BOTTLES DRAWN AEROBIC AND ANAEROBIC Blood Culture results may not  be optimal due to an inadequate volume of blood received in culture bottles   Culture   Final    NO GROWTH 2 DAYS Performed at [facility], [address] [place], [place] [address]    Report Status PENDING  Incomplete  MRSA Next Gen by PCR, Nasal     Status: None   Collection Time: 11/05/21  6:57 AM   Specimen: Nasal Mucosa; Nasal Swab  Result Value Ref Range Status   MRSA by PCR Next Gen NOT DETECTED NOT DETECTED Final    Comment: (NOTE) The GeneXpert MRSA Assay (FDA approved for NASAL specimens only), is one component of a comprehensive MRSA colonization surveillance program. It is not intended to diagnose MRSA infection nor to guide or monitor treatment for MRSA infections. Test performance is not FDA approved in patients less than 43 years old. Performed at [facility], [address] [place], [place] [address]     Coagulation Studies: Recent Labs    11/05/21 0450 11/05/21 1003 11/06/21 0436 11/07/21 0512  LABPROT 74.3* 17.0* 17.3* 18.0*  INR 9.2* 1.4* 1.4* 1.5*     Urinalysis: No results for input(s): "COLORURINE", "LABSPEC", "PHURINE", "GLUCOSEU", "HGBUR", "BILIRUBINUR", "KETONESUR", "PROTEINUR", "UROBILINOGEN", "NITRITE", "LEUKOCYTESUR" in the last 72 hours.  Invalid input(s): "APPERANCEUR"    Imaging: ECHOCARDIOGRAM COMPLETE  Result Date: 11/07/2021     ECHOCARDIOGRAM REPORT   Patient Name:   ZURY FAZZINO Date of Exam: 11/06/2021 Medical Rec #:  [id number]       Height:       65.0 in Accession #:    [id number]      Weight:       127.6 lb Date of Birth:  February 21, 1955       BSA:          1.634 m Patient Age:    55 years        BP:           109/66 mmHg Patient Gender: F               HR:           96 bpm. Exam Location:  [facility] Procedure: 2D Echo, Color Doppler and Cardiac Doppler Indications:     Syncope R55  History:         Patient has prior history of Echocardiogram examinations, most                  recent 07/29/2021. CHF; Risk Factors:Hypertension. CKD, dialysis                  patient.  Sonographer:     Sherrie Sport Referring Phys:  [place] Diagnosing Phys: Yolonda Kida MD  Sonographer Comments: Image acquisition challenging due to mastectomy. IMPRESSIONS  1. Left ventricular ejection fraction, by estimation, is 50 to 55%. The left ventricle has low normal function. The left ventricle has no regional wall motion abnormalities. There is mild concentric left ventricular hypertrophy. Left ventricular diastolic parameters are consistent with Grade I diastolic dysfunction (impaired relaxation).  2. Right ventricular systolic function is normal. The right ventricular size is normal.  3. The mitral valve is normal in structure. Trivial mitral valve regurgitation.  4. The aortic valve is calcified. Aortic valve regurgitation is not visualized. Aortic valve sclerosis/calcification is present, without any evidence of aortic stenosis. FINDINGS  Left Ventricle: Left ventricular ejection fraction, by estimation, is 50 to 55%. The left ventricle has low normal function. The left ventricle  has no regional wall motion abnormalities. The left ventricular internal cavity size was normal in size. There is mild concentric left ventricular hypertrophy. Left ventricular diastolic parameters are consistent with Grade I diastolic dysfunction (impaired relaxation). Right  Ventricle: The right ventricular size is normal. No increase in right ventricular wall thickness. Right ventricular systolic function is normal. Left Atrium: Left atrial size was normal in size. Right Atrium: Right atrial size was normal in size. Pericardium: There is no evidence of pericardial effusion. Mitral Valve: The mitral valve is normal in structure. Trivial mitral valve regurgitation. Tricuspid Valve: The tricuspid valve is normal in structure. Tricuspid valve regurgitation is trivial. Aortic Valve: The aortic valve is calcified. Aortic valve regurgitation is not visualized. Aortic valve sclerosis/calcification is present, without any evidence of aortic stenosis. Aortic valve mean gradient measures 5.3 mmHg. Aortic valve peak gradient measures 10.2 mmHg. Aortic valve area, by VTI measures 1.93 cm. Pulmonic Valve: The pulmonic valve was normal in structure. Pulmonic valve regurgitation is not visualized. Aorta: The ascending aorta was not well visualized. IAS/Shunts: No atrial level shunt detected by color flow Doppler.  LEFT VENTRICLE PLAX 2D LVIDd:         3.80 cm   Diastology LVIDs:         2.80 cm   LV e' medial:    7.83 cm/s LV PW:         1.20 cm   LV E/e' medial:  13.7 LV IVS:        1.30 cm   LV e' lateral:   8.81 cm/s LVOT diam:     2.00 cm   LV E/e' lateral: 12.1 LV SV:         52 LV SV Index:   32 LVOT Area:     3.14 cm  RIGHT VENTRICLE RV Basal diam:  2.60 cm RV Mid diam:    2.60 cm RV S prime:     14.10 cm/s LEFT ATRIUM             Index        RIGHT ATRIUM           Index LA diam:        3.80 cm 2.32 cm/m   RA Area:     10.20 cm LA Vol (A2C):   33.7 ml 20.62 ml/m  RA Volume:   16.90 ml  10.34 ml/m LA Vol (A4C):   28.9 ml 17.68 ml/m LA Biplane Vol: 31.4 ml 19.21 ml/m  AORTIC VALVE AV Area (Vmax):    1.94 cm AV Area (Vmean):   2.02 cm AV Area (VTI):     1.93 cm AV Vmax:           159.33 cm/s AV Vmean:          107.000 cm/s AV VTI:            0.267 m AV Peak Grad:      10.2 mmHg AV Mean  Grad:      5.3 mmHg LVOT Vmax:         98.50 cm/s LVOT Vmean:        68.700 cm/s LVOT VTI:          0.164 m LVOT/AV VTI ratio: 0.62  AORTA Ao Root diam: 3.30 cm MITRAL VALVE                TRICUSPID VALVE MV Area (PHT): 4.29 cm     TR Peak grad:   31.8 mmHg MV  Decel Time: 177 msec     TR Vmax:        282.00 cm/s MV E velocity: 107.00 cm/s MV A velocity: 104.00 cm/s  SHUNTS MV E/A ratio:  1.03         Systemic VTI:  0.16 m                             Systemic Diam: 2.00 cm Yolonda Kida MD Electronically signed by Yolonda Kida MD Signature Date/Time: 11/07/2021/6:49:47 AM    Final    US Carotid Bilateral  Result Date: 11/06/2021 CLINICAL DATA:  66 year old female with history of syncope. EXAM: BILATERAL CAROTID DUPLEX ULTRASOUND TECHNIQUE: Pearline Cables scale imaging, color Doppler and duplex ultrasound were performed of bilateral carotid and vertebral arteries in the neck. COMPARISON:  None Available. FINDINGS: Criteria: Quantification of carotid stenosis is based on velocity parameters that correlate the residual internal carotid diameter with NASCET-based stenosis levels, using the diameter of the distal internal carotid lumen as the denominator for stenosis measurement. The following velocity measurements were obtained: RIGHT ICA: Peak systolic velocity 706 cm/sec, End diastolic velocity 45 cm/sec CCA: Peak systolic velocity 237 cm/sec SYSTOLIC ICA/CCA RATIO:  1.2 ECA: Peak systolic velocity 72 cm/sec LEFT ICA: Peak systolic velocity 628 cm/sec, End diastolic velocity 57 cm/sec CCA: 315 cm/sec SYSTOLIC ICA/CCA RATIO:  1.3 ECA: 75 cm/sec RIGHT CAROTID ARTERY: Mild multifocal atherosclerotic plaque formation. No significant tortuosity. Normal low resistance waveforms. RIGHT VERTEBRAL ARTERY:  Antegrade flow. LEFT CAROTID ARTERY: Mild multifocal atherosclerotic plaque formation. No significant tortuosity. Normal low resistance waveforms. LEFT VERTEBRAL ARTERY:  Antegrade flow. Upper extremity non-invasive blood  pressures: Not obtained. IMPRESSION: 1. Right carotid artery system: Less than 50% stenosis secondary to mild multifocal atherosclerotic plaque formation. 2. Left carotid artery system: Less than 50% stenosis secondary to mild multifocal atherosclerotic plaque formation. 3.  Vertebral artery system: Patent with antegrade flow bilaterally. Ruthann Cancer, MD [facility] Radiology Electronically Signed   By: Ruthann Cancer M.D.   On: 11/06/2021 14:27   CT ABDOMEN PELVIS WO CONTRAST  Result Date: 11/06/2021 CLINICAL DATA:  Retroperitoneal hematoma, follow up EXAM: CT ABDOMEN AND PELVIS WITHOUT CONTRAST TECHNIQUE: Multidetector CT imaging of the abdomen and pelvis was performed following the standard protocol without IV contrast. RADIATION DOSE REDUCTION: This exam was performed according to the departmental dose-optimization program which includes automated exposure control, adjustment of the mA and/or kV according to patient size and/or use of iterative reconstruction technique. COMPARISON:  CTA 11/05/2021. FINDINGS: Lower chest: Small bilateral pleural effusions with adjacent atelectasis, slightly increased from prior exam. Partially visualized ascending aortic aneurysm, see recent CTA of the chest 11/05/2021. Hepatobiliary: No focal liver abnormality is seen. The gallbladder is distended with vicarious excretion of contrast. Pancreas: No ductal dilation or peripancreatic inflammatory change. Spleen: Normal in size without focal abnormality. Adrenals/Urinary Tract: Adrenal glands are unremarkable. Acquired cystic renal disease of dialysis. There is a right renal and pararenal retroperitoneal hematoma, which appears more hyperdense in areas in comparison to recent CT, likely reflecting developing clot. There is intraperitoneal hemorrhage as well with blood products in the pericolic gutters, perihepatic, perisplenic spaces, as well as in the pelvis. Overall volume of  abdominopelvic hemoperitoneum similar to recent CT. Stomach/Bowel: The stomach is within normal limits. There is no evidence of bowel obstruction.Prior appendectomy, per medical record. Vascular/Lymphatic: Aorta iliac atherosclerosis. Interval coil embolization of the right renal vasculature. Reproductive: Prior hysterectomy.  Other: There is a pressure dressing applied to the right groin. There is a left femoral angio catheter with tip terminating a left common iliac vein. Musculoskeletal: No acute osseous abnormality. No suspicious osseous lesion. Renal osteodystrophy. IMPRESSION: Evolving right-sided retroperitoneal hematoma, with overall similar volume of hemoperitoneum in comparison to recent CT. Interval right renal vascular coil embolization. Small bilateral pleural effusions with adjacent atelectasis, slightly increased from prior exam. 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 Electronically Signed   By: Maurine Simmering M.D.   On: 11/06/2021 13:47   DG Abd 1 View  Result Date: 11/06/2021 CLINICAL DATA:  Abdominal pain EXAM: ABDOMEN - 1 VIEW COMPARISON:  11/05/2021 FINDINGS: Left femoral catheter is identified with tip projecting over the lower abdomen just below the level of the aortic bifurcation. Embolization coils are noted within the right upper abdomen. The bowel gas pattern appears nonobstructed. There are a few prominent air-filled loops of bowel noted in the right lower quadrant of the abdomen. Gas and stool noted within the colon up to the level of the rectum. IMPRESSION: 1. Nonobstructive bowel gas pattern. 2. Left femoral catheter tip projects over the lower abdomen just below the level of the aortic bifurcation. Electronically Signed   By: Kerby Moors M.D.   On: 11/06/2021 10:44   PERIPHERAL VASCULAR CATHETERIZATION  Result Date: 11/05/2021 See surgical note for result.    Medications:    sodium chloride Stopped (11/05/21 0939)   amiodarone 30 mg/hr  (11/07/21 1025)   anticoagulant sodium citrate     dexmedetomidine (PRECEDEX) IV infusion 0.7 mcg/kg/hr (11/07/21 1025)   phenylephrine (NEO-SYNEPHRINE) Adult infusion Stopped (11/07/21 0552)    Chlorhexidine Gluconate Cloth  6 each Topical Q0600   feeding supplement (NEPRO CARB STEADY)  237 mL Oral TID BM   heparin sodium (porcine)       multivitamin  1 tablet Oral QHS   alteplase, anticoagulant sodium citrate, docusate sodium, heparin, heparin sodium (porcine), HYDROmorphone (DILAUDID) injection, lidocaine (PF), lidocaine-prilocaine, mouth rinse, oxyCODONE, pentafluoroprop-tetrafluoroeth, polyethylene glycol  Assessment/ Plan:  Ms. Kashira Behunin is a 66 y.o.  female is a 66 year old African-American female with past medical conditions including hypertension, gout, anxiety with depression, sickle cell trait, systolic heart failure with a EF 40 to 45%, breast cancer with bilateral mastectomy, seizures, and end-stage renal disease on hemodialysis.  Patient presents to the emergency department via EMS for multiple syncopal episodes.  Patient has been admitted for Right flank pain [R10.9] Elevated lactic acid level [R79.89] Hypotension [I95.9] Hypotension, unspecified hypotension type [I95.9] Altered mental status, unspecified altered mental status type [R41.82]  [facility] N /MWF/Lt Permcath  End-stage renal disease on hemodialysis.  Will maintain outpatient schedule if possible.  Patient receiving bedside dialysis treatment today, UF goal 1 L as tolerated.  Due to recent procedure, will avoid heparin during treatment, patient notified of this.  Next treatment scheduled for Friday.  2. Anemia of chronic kidney disease with acute blood loss Lab Results  Component Value Date   HGB 7.0 (L) 11/07/2021  CT angio abdomen pelvis shows severe hemorrhage at right kidney.  Appreciate vascular surgery performing right renal angiogram with coil embolization to manage hemorrhage.  Patient  received 2 units FFP and blood transfusions thus far during admission.  Hemoglobin remains below desired target, 7.0.  Will defer need for additional units of blood to primary team.   3. Secondary Hyperparathyroidism: with outpatient labs: PTH 379, phosphorus 5.7, calcium 9.4 on June 18, 2021.   Lab Results  Component Value Date   PTH 214 (H) 07/08/2021   CALCIUM 8.8 (L) 11/07/2021   PHOS 8.6 (H) 11/07/2021    Calcium acceptable while phosphorus remains elevated.  Will order sevelamer with meals.  4.  Hypotension likely due to blood loss, levo stopped this morning.  Blood pressure soft, 95/67 during dialysis.  5.  Hyperkalemia, 5.2.  Will be corrected with dialysis today.   LOS: 2 Qusai Kem 11/1/202312:27 PM

## 2021-11-07 NOTE — Progress Notes (Signed)
Pharmacy Antibiotic Note  Teresa Cook is a 66 y.o. female w/ PMH of HTN, gout, anxiety with depression, sickle cell trait, systolic heart failure with a EF 40 to 45%, breast cancer with bilateral mastectomy, seizures, and end-stage renal disease on hemodialysis admitted on 11/05/2021 with  Hemorrhagic shock due to right renal hemorrhage with large hematoma .  Pharmacy has been consulted for Zosyn dosing.  Plan: start Zosyn 2.25 grams IV every 8 hours  Weight: 61 kg (134 lb 7.7 oz)  Temp (24hrs), Avg:98.8 F (37.1 C), Min:97.8 F (36.6 C), Max:102.5 F (39.2 C)  Recent Labs  Lab 11/05/21 0320 11/05/21 0450 11/05/21 1003 11/05/21 1322 11/05/21 2050 11/06/21 0435 11/06/21 0436 11/06/21 0821 11/07/21 0512  WBC 12.9*  --   --   --   --  13.4* 13.7*  --  14.4*  CREATININE 10.38*  --   --   --   --   --  8.25*  --  10.10*  LATICACIDVEN 3.8* 2.6* 4.6* 1.5 1.2  --   --  1.2  --     Estimated Creatinine Clearance: 4.9 mL/min (A) (by C-G formula based on SCr of 10.1 mg/dL (H)).    Allergies  Allergen Reactions   Gabapentin Other (See Comments)    Seizure   Adhesive [Tape] Itching    Silk tape is ok to use.    Antimicrobials this admission: 11/01 Zosyn >>   Microbiology results: 10/30 BCx: NGTD 10/30 UCx: pending  10/30 MRSA PCR: negative  Thank you for allowing pharmacy to be a part of this patient's care.  Dallie Piles 11/07/2021 8:33 PM

## 2021-11-07 NOTE — Progress Notes (Signed)
Rounding Note    Patient Name: Teresa Cook Date of Encounter: 11/07/2021  [place] Cardiologist: None  New Consult done by Dr End  Subjective   Patient seen on a.m. rounds.  She is more talkative today.  She denies any chest pain, shortness of breath, or palpitations.  She continues to endorse abdominal and flank pain.  She converted back into sinus rhythm yesterday evening around 6 PM and has been maintaining sinus on amiodarone drip.  Vasopressors have also been turned off this morning.  Inpatient Medications    Scheduled Meds:  Chlorhexidine Gluconate Cloth  6 each Topical Q0600   feeding supplement (NEPRO CARB STEADY)  237 mL Oral TID BM   multivitamin  1 tablet Oral QHS   Continuous Infusions:  sodium chloride Stopped (11/05/21 0939)   amiodarone 30 mg/hr (11/07/21 0600)   anticoagulant sodium citrate     dexmedetomidine (PRECEDEX) IV infusion 0.5 mcg/kg/hr (11/07/21 0600)   phenylephrine (NEO-SYNEPHRINE) Adult infusion Stopped (11/07/21 0552)   PRN Meds: alteplase, anticoagulant sodium citrate, docusate sodium, heparin, HYDROmorphone (DILAUDID) injection, lidocaine (PF), lidocaine-prilocaine, mouth rinse, pentafluoroprop-tetrafluoroeth, polyethylene glycol   Vital Signs    Vitals:   11/07/21 0615 11/07/21 0630 11/07/21 0645 11/07/21 0700  BP: 95/62 93/64 (!) 88/54 (!) 88/60  Pulse: 97 81 82 80  Resp: (!) '25 19 19 17  '$ Temp:      TempSrc:      SpO2: (!) 86% 97% 97% 98%  Weight:        Intake/Output Summary (Last 24 hours) at 11/07/2021 0759 Last data filed at 11/07/2021 0600 Gross per 24 hour  Intake 900.98 ml  Output --  Net 900.98 ml      11/07/2021    5:00 AM 11/06/2021    3:40 AM 11/05/2021    7:05 AM  Last 3 Weights  Weight (lbs) 132 lb 0.9 oz 127 lb 10.3 oz 126 lb 1.7 oz  Weight (kg) 59.9 kg 57.9 kg 57.2 kg      Telemetry    Sinus rhythm with PACs and unifocal PVCs- Personally Reviewed  ECG    No new tracings- Personally  Reviewed  Physical Exam   GEN: No acute distress.   Neck: No JVD Cardiac: RRR, no murmurs, rubs, or gallops.  Respiratory: Slightly diminished to auscultation bilaterally anteriorly.  Respirations are unlabored at rest on 2 L of O2 via nasal cannula. GI: Soft, nontender, non-distended  MS: No edema; No deformity. Neuro:  Nonfocal  Psych: Normal affect   Labs    High Sensitivity Troponin:   Recent Labs  Lab 11/05/21 0450 11/06/21 0821 11/06/21 0950 11/06/21 1526 11/06/21 1743  TROPONINIHS '10 17 16 17 16     '$ Chemistry Recent Labs  Lab 11/05/21 0320 11/06/21 0436 11/06/21 1526 11/07/21 0512  NA 141 138  --  135  K 4.7 4.6 4.7 5.2*  CL 107 100  --  99  CO2 19* 26  --  24  GLUCOSE 185* 105*  --  97  BUN 65* 49*  --  63*  CREATININE 10.38* 8.25*  --  10.10*  CALCIUM 7.8* 8.3*  --  8.8*  MG  --  1.8 1.7 1.9  PROT 5.1*  --   --   --   ALBUMIN 2.7*  --   --  2.8*  AST 19  --   --   --   ALT 11  --   --   --   ALKPHOS 58  --   --   --  BILITOT 0.5  --   --   --   GFRNONAA 4* 5*  --  4*  ANIONGAP 15 12  --  12    Lipids No results for input(s): "CHOL", "TRIG", "HDL", "LABVLDL", "LDLCALC", "CHOLHDL" in the last 168 hours.  Hematology Recent Labs  Lab 11/06/21 0435 11/06/21 0436 11/06/21 0552 11/06/21 2357 11/07/21 0512 11/07/21 0612  WBC 13.4* 13.7*  --   --  14.4*  --   RBC 2.61* 2.45*  --   --  2.45*  --   HGB 7.3* 6.9*   < > 7.2* 6.9* 7.0*  HCT 21.1* 20.8*   < > 20.7* 20.3* 20.2*  MCV 80.8 84.9  --   --  82.9  --   MCH 28.0 28.2  --   --  28.2  --   MCHC 34.6 33.2  --   --  34.0  --   RDW 16.7* 17.1*  --   --  15.9*  --   PLT 73* 87*  --   --  81*  --    < > = values in this interval not displayed.   Thyroid  Recent Labs  Lab 11/05/21 0658 11/06/21 1526  TSH 3.354 2.080  FREET4 0.84  --     BNP Recent Labs  Lab 11/05/21 0320  BNP 128.8*    DDimer  Recent Labs  Lab 11/06/21 0950  DDIMER 16.58*     Radiology    ECHOCARDIOGRAM  COMPLETE  Result Date: 11/07/2021    ECHOCARDIOGRAM REPORT   Patient Name:   Teresa Cook Date of Exam: 11/06/2021 Medical Rec #:  [id number]       Height:       65.0 in Accession #:    [id number]      Weight:       127.6 lb Date of Birth:  22-Nov-1955       BSA:          1.634 m Patient Age:    66 years        BP:           109/66 mmHg Patient Gender: F               HR:           96 bpm. Exam Location:  [facility] Procedure: 2D Echo, Color Doppler and Cardiac Doppler Indications:     Syncope R55  History:         Patient has prior history of Echocardiogram examinations, most                  recent 07/29/2021. CHF; Risk Factors:Hypertension. CKD, dialysis                  patient.  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 IMPRESSIONS  1. Left ventricular ejection fraction, by estimation, is 50 to 55%. The left ventricle has low normal function. The left ventricle has no regional wall motion abnormalities. There is mild concentric left ventricular hypertrophy. Left ventricular diastolic parameters are consistent with Grade I diastolic dysfunction (impaired relaxation).  2. Right ventricular systolic function is normal. The right ventricular size is normal.  3. The mitral valve is normal in structure. Trivial mitral valve regurgitation.  4. The aortic valve is calcified. Aortic valve regurgitation is not visualized. Aortic valve sclerosis/calcification is present, without  any evidence of aortic stenosis. FINDINGS  Left Ventricle: Left ventricular ejection fraction, by estimation, is 50 to 55%. The left ventricle has low normal function. The left ventricle has no regional wall motion abnormalities. The left ventricular internal cavity size was normal in size. There is mild concentric left ventricular hypertrophy. Left ventricular diastolic parameters are consistent with Grade I diastolic  dysfunction (impaired relaxation). Right Ventricle: The right ventricular size is normal. No increase in right ventricular wall thickness. Right ventricular systolic function is normal. Left Atrium: Left atrial size was normal in size. Right Atrium: Right atrial size was normal in size. Pericardium: There is no evidence of pericardial effusion. Mitral Valve: The mitral valve is normal in structure. Trivial mitral valve regurgitation. Tricuspid Valve: The tricuspid valve is normal in structure. Tricuspid valve regurgitation is trivial. Aortic Valve: The aortic valve is calcified. Aortic valve regurgitation is not visualized. Aortic valve sclerosis/calcification is present, without any evidence of aortic stenosis. Aortic valve mean gradient measures 5.3 mmHg. Aortic valve peak gradient measures 10.2 mmHg. Aortic valve area, by VTI measures 1.93 cm. Pulmonic Valve: The pulmonic valve was normal in structure. Pulmonic valve regurgitation is not visualized. Aorta: The ascending aorta was not well visualized. IAS/Shunts: No atrial level shunt detected by color flow Doppler.  LEFT VENTRICLE PLAX 2D LVIDd:         3.80 cm   Diastology LVIDs:         2.80 cm   LV e' medial:    7.83 cm/s LV PW:         1.20 cm   LV E/e' medial:  13.7 LV IVS:        1.30 cm   LV e' lateral:   8.81 cm/s LVOT diam:     2.00 cm   LV E/e' lateral: 12.1 LV SV:         52 LV SV Index:   32 LVOT Area:     3.14 cm  RIGHT VENTRICLE RV Basal diam:  2.60 cm RV Mid diam:    2.60 cm RV S prime:     14.10 cm/s LEFT ATRIUM             Index        RIGHT ATRIUM           Index LA diam:        3.80 cm 2.32 cm/m   RA Area:     10.20 cm LA Vol (A2C):   33.7 ml 20.62 ml/m  RA Volume:   16.90 ml  10.34 ml/m LA Vol (A4C):   28.9 ml 17.68 ml/m LA Biplane Vol: 31.4 ml 19.21 ml/m  AORTIC VALVE AV Area (Vmax):    1.94 cm AV Area (Vmean):   2.02 cm AV Area (VTI):     1.93 cm AV Vmax:           159.33 cm/s AV Vmean:          107.000 cm/s AV VTI:             0.267 m AV Peak Grad:      10.2 mmHg AV Mean Grad:      5.3 mmHg LVOT Vmax:         98.50 cm/s LVOT Vmean:        68.700 cm/s LVOT VTI:          0.164 m LVOT/AV VTI ratio: 0.62  AORTA Ao Root diam: 3.30 cm MITRAL VALVE  TRICUSPID VALVE MV Area (PHT): 4.29 cm     TR Peak grad:   31.8 mmHg MV Decel Time: 177 msec     TR Vmax:        282.00 cm/s MV E velocity: 107.00 cm/s MV A velocity: 104.00 cm/s  SHUNTS MV E/A ratio:  1.03         Systemic VTI:  0.16 m                             Systemic Diam: 2.00 cm Yolonda Kida MD Electronically signed by Yolonda Kida MD Signature Date/Time: 11/07/2021/6:49:47 AM    Final    US Carotid Bilateral  Result Date: 11/06/2021 CLINICAL DATA:  66 year old female with history of syncope. EXAM: BILATERAL CAROTID DUPLEX ULTRASOUND TECHNIQUE: Pearline Cables scale imaging, color Doppler and duplex ultrasound were performed of bilateral carotid and vertebral arteries in the neck. COMPARISON:  None Available. FINDINGS: Criteria: Quantification of carotid stenosis is based on velocity parameters that correlate the residual internal carotid diameter with NASCET-based stenosis levels, using the diameter of the distal internal carotid lumen as the denominator for stenosis measurement. The following velocity measurements were obtained: RIGHT ICA: Peak systolic velocity 893 cm/sec, End diastolic velocity 45 cm/sec CCA: Peak systolic velocity 734 cm/sec SYSTOLIC ICA/CCA RATIO:  1.2 ECA: Peak systolic velocity 72 cm/sec LEFT ICA: Peak systolic velocity 287 cm/sec, End diastolic velocity 57 cm/sec CCA: 681 cm/sec SYSTOLIC ICA/CCA RATIO:  1.3 ECA: 75 cm/sec RIGHT CAROTID ARTERY: Mild multifocal atherosclerotic plaque formation. No significant tortuosity. Normal low resistance waveforms. RIGHT VERTEBRAL ARTERY:  Antegrade flow. LEFT CAROTID ARTERY: Mild multifocal atherosclerotic plaque formation. No significant tortuosity. Normal low resistance waveforms. LEFT VERTEBRAL ARTERY:   Antegrade flow. Upper extremity non-invasive blood pressures: Not obtained. IMPRESSION: 1. Right carotid artery system: Less than 50% stenosis secondary to mild multifocal atherosclerotic plaque formation. 2. Left carotid artery system: Less than 50% stenosis secondary to mild multifocal atherosclerotic plaque formation. 3.  Vertebral artery system: Patent with antegrade flow bilaterally. Ruthann Cancer, MD [facility] Electronically Signed   By: Ruthann Cancer M.D.   On: 11/06/2021 14:27   CT ABDOMEN PELVIS WO CONTRAST  Result Date: 11/06/2021 CLINICAL DATA:  Retroperitoneal hematoma, follow up EXAM: CT ABDOMEN AND PELVIS WITHOUT CONTRAST TECHNIQUE: Multidetector CT imaging of the abdomen and pelvis was performed following the standard protocol without IV contrast. RADIATION DOSE REDUCTION: This exam was performed according to the departmental dose-optimization program which includes automated exposure control, adjustment of the mA and/or kV according to patient size and/or use of iterative reconstruction technique. COMPARISON:  CTA 11/05/2021. FINDINGS: Lower chest: Small bilateral pleural effusions with adjacent atelectasis, slightly increased from prior exam. Partially visualized ascending aortic aneurysm, see recent CTA of the chest 11/05/2021. Hepatobiliary: No focal liver abnormality is seen. The gallbladder is distended with vicarious excretion of contrast. Pancreas: No ductal dilation or peripancreatic inflammatory change. Spleen: Normal in size without focal abnormality. Adrenals/Urinary Tract: Adrenal glands are unremarkable. Acquired cystic renal disease of dialysis. There is a right renal and pararenal retroperitoneal hematoma, which appears more hyperdense in areas in comparison to recent CT, likely reflecting developing clot. There is intraperitoneal hemorrhage as well with blood products in the pericolic gutters, perihepatic, perisplenic  spaces, as well as in the pelvis. Overall volume of abdominopelvic hemoperitoneum similar to recent CT. Stomach/Bowel: The stomach is within normal limits. There is no evidence of bowel obstruction.Prior  appendectomy, per medical record. Vascular/Lymphatic: Aorta iliac atherosclerosis. Interval coil embolization of the right renal vasculature. Reproductive: Prior hysterectomy. Other: There is a pressure dressing applied to the right groin. There is a left femoral angio catheter with tip terminating a left common iliac vein. Musculoskeletal: No acute osseous abnormality. No suspicious osseous lesion. Renal osteodystrophy. IMPRESSION: Evolving right-sided retroperitoneal hematoma, with overall similar volume of hemoperitoneum in comparison to recent CT. Interval right renal vascular coil embolization. Small bilateral pleural effusions with adjacent atelectasis, slightly increased from prior exam. 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 Electronically Signed   By: Maurine Simmering M.D.   On: 11/06/2021 13:47   DG Abd 1 View  Result Date: 11/06/2021 CLINICAL DATA:  Abdominal pain EXAM: ABDOMEN - 1 VIEW COMPARISON:  11/05/2021 FINDINGS: Left femoral catheter is identified with tip projecting over the lower abdomen just below the level of the aortic bifurcation. Embolization coils are noted within the right upper abdomen. The bowel gas pattern appears nonobstructed. There are a few prominent air-filled loops of bowel noted in the right lower quadrant of the abdomen. Gas and stool noted within the colon up to the level of the rectum. IMPRESSION: 1. Nonobstructive bowel gas pattern. 2. Left femoral catheter tip projects over the lower abdomen just below the level of the aortic bifurcation. Electronically Signed   By: Kerby Moors M.D.   On: 11/06/2021 10:44   PERIPHERAL VASCULAR CATHETERIZATION  Result Date: 11/05/2021 See surgical note for result.   Cardiac Studies  TTE 11/06/2021 1.  Left ventricular ejection fraction, by estimation, is 50 to 55%. The  left ventricle has low normal function. The left ventricle has no regional  wall motion abnormalities. There is mild concentric left ventricular  hypertrophy. Left ventricular  diastolic parameters are consistent with Grade I diastolic dysfunction  (impaired relaxation).   2. Right ventricular systolic function is normal. The right ventricular  size is normal.   3. The mitral valve is normal in structure. Trivial mitral valve  regurgitation.   4. The aortic valve is calcified. Aortic valve regurgitation is not  visualized. Aortic valve sclerosis/calcification is present, without any  evidence of aortic stenosis.   Patient Profile     66 y.o. female with a history of end-stage renal disease on hemodialysis, congestive heart failure, atrial fibrillation, DVT, cerebral meningioma, depression, and breast cancer who is being seen and evaluated for new onset atrial fibrillation RVR and episodic nonsustained ventricular tachycardia.  Assessment & Plan    Atrial fibrillation RVR with episodic nonsustained V. Tach -Currently maintaining sinus rhythm on cardiac monitor converted around 1750 on 11/06/2021 -Continued on amiodarone drip -Once taking oral medications can be transition to p.o. amiodarone -Unable to currently start anticoagulation due to recent right renal hemorrhage and anemia -Recommend keeping potassium around 4 and magnesium around 2 -Previously been on warfarin therapy with INR management by nephrology -Continue with cardiac monitoring  Hemorrhagic shock in the setting of right renal hemorrhage with syncope and collapse -Shock is resolved with lactic acid normalized -Hemoglobin this morning 6.9 -Vasopressors weaned off this morning -Vascular surgery continues to follow-up status post embolization of the right renal artery on 11/05/2021  HFpEF -Echocardiogram completed yesterday showed LVEF 50-55%, no  regional wall motion abnormalities, G1 DD, and trivial mitral regurgitation -High-sensitivity troponin negative  -Remain cautious with IV fluids -Daily weights and I's and O's  Acute blood loss anemia in the setting of supratherapeutic INR -Hemoglobin this morning 6.9 -H&H every 6 hours -Transfuse  for hemoglobin less than 7 -DIC work-up was negative -Continue to monitor for signs and symptoms of active bleeding  Stage renal disease on dialysis -Monitor I's and O's -Daily BMP -Maintain MAP greater than equal to 65 mmHg -Monitor/trend/replete electrolytes as needed -Nephrology is following and managing hemodialysis     For questions or updates, please contact [place] Please consult [email or web address] for contact info under        Signed, Jency Schnieders, NP  11/07/2021, 7:59 AM

## 2021-11-07 NOTE — Progress Notes (Signed)
PT Cancellation Note  Patient Details Name: Teresa Cook MRN: [id number] DOB: [birth year]   Cancelled Treatment:    Reason Eval/Treat Not Completed: Medical issues which prohibited therapy (Chart reviewed, discussed with RN. Pt receiving HD in room upon first attempt. Post HD, vital signs not within parameters, either hypotensive or resting tachycardia.) At this point will defer PT evaluation to later date time. With vitals as they are, I do not feel this would allow for an accurate assessment of what patient is able to do from a mobility standpoint.   3:46 PM, 11/07/21 Teresa Cook, PT, DPT Physical Therapist - [facility]  [phone] ([place])     Teresa Cook C 11/07/2021, 3:45 PM

## 2021-11-07 NOTE — Progress Notes (Signed)
   11/07/21 1344  Vitals  Temp 97.8 F (36.6 C)  Temp Source Oral  BP (!) 85/57  MAP (mmHg) 66  BP Location Right Arm  BP Method Automatic  Patient Position (if appropriate) Lying  Pulse Rate (!) 124  Pulse Rate Source Monitor  ECG Heart Rate (!) 123  Resp (!) 30  Oxygen Therapy  SpO2 96 %  O2 Device Nasal Cannula  O2 Flow Rate (L/min) 2 L/min  Patient Activity (if Appropriate) In bed  Pulse Oximetry Type Continuous  During Treatment Monitoring  Blood Flow Rate (mL/min) 200 mL/min  HD Safety Checks Performed Yes  Intra-Hemodialysis Comments Tx completed  Post Treatment  Dialyzer Clearance Lightly streaked  Duration of HD Treatment -hour(s) 2.45 hour(s)  Hemodialysis Intake (mL) 0 mL  Liters Processed 57  Fluid Removed 600 mL  Tolerated HD Treatment Yes  Post-Hemodialysis Comments hd completed. uf off during hypotension.  Hemodialysis Catheter Left Internal jugular Permanent  Placement Date: (c)   Placed prior to admission: Yes  Orientation: Left  Access Location: Internal jugular  Hemodialysis Catheter Type: Permanent  Site Condition No complications  Blue Lumen Status Heparin locked  Red Lumen Status Heparin locked  Purple Lumen Status N/A  Catheter fill solution Heparin 1000 units/ml  Catheter fill volume (Arterial) 1.7 cc  Catheter fill volume (Venous) 1.7  Dressing Type Transparent  Dressing Status Antimicrobial disc in place  Interventions New dressing  Drainage Description None  Dressing Change Due 11/12/21  Post treatment catheter status Capped and Clamped

## 2021-11-07 NOTE — Progress Notes (Addendum)
NAME:  Teresa Cook, MRN:  [id number], DOB:  02-Jun-1955, LOS: 2 ADMISSION DATE:  11/05/2021, CONSULTATION DATE:  11/05/2021 REFERRING MD:  Dr. Karma Greaser, CHIEF COMPLAINT:  Back/abdominal pain, loss of consciousness   Brief Pt Description / Synopsis:  66 y.o female with PMH significant for ESRD on HD, A. Fib & DVT on Warfarin , admitted with Hemorrhagic shock due to right renal hemorrhage with large hematoma in setting of supratherapeutic INR (INR >9).  Vascular Surgery consulted, required coil embolization of the right renal artery.  History of Present Illness:  Teresa Cook is a 66 year old female with a past medical history significant for ESRD on hemodialysis with chronic PermCath, CHF, DVT, cerebral meningioma who presents to [facility] ED on 11/05/2021 status post fall and loss of consciousness.  Patient reports she lives alone and passed out at least 4 times overnight, and when going to the bathroom around 5 AM this morning,  she passed out again it and thinks she hit her head.  Following the fall she also reports development of back/flank pain that radiates around to the right lower quadrant.  She reports she has been compliant with her hemodialysis, last session on Friday.  She denies chest pain, shortness of breath.  She reports compliance with her warfarin for anticoagulation.  ED Course: Initial Vital Signs: Temperature 97.6 F rectally, blood pressure 125/81, pulse 74, respiratory rate 25 Significant Labs: Bicarb 19, glucose 185, BUN 65, creatinine 10.38, albumin 2.7, BNP 128, high-sensitivity troponin 11, lactic acid 3.8, WBC 12.9 with neutrophilia, hemoglobin 8.0, hematocrit 25.1, prothrombin time 74.3, INR 9.2, APTT 36, TSH 3.3, free T4 0.84 COVID-19 PCR negative Imaging Chest X-ray>>IMPRESSION: 1. No active disease. CT head without contrast>>IMPRESSION: 1. Broad-based right anterior convexity scalp hematoma without underlying skull fracture. 2.  No acute traumatic injury  identified to the brain 3. But slowly enlarging chronic midline posterior fossa Meningioma, now up to 28 mm long axis versus 17 mm in 2017. No associated edema or significant mass effect. Recommend follow-up with Neurosurgery. CT cervical spine>>IMPRESSION: 1. No acute traumatic injury identified in the cervical spine. Mild for age cervical spine degeneration. 2. Aortic Atherosclerosis (ICD10-I70.0). Left-side dialysis catheter. CTA chest/abdomen/pelvis>>IMPRESSION: 1. Positive for Severe Acute Right Renal hemorrhage. Underlying chronic renal disease of dialysis with Active Renal Vessel Contrast Extravasation visible on delayed series 13, and large volume retroperitoneal/intraperitoneal hemorrhage of > 600 mL. This Critical Value/emergent result called by telephone at the time of interpretation on 11/05/2021 at 6:49 am to Dr. Hinda Kehr , who verbally acknowledged these results. 2. No aortic dissection (but see #3) or other significant acute finding; small right pleural and pericardial effusions appear to be transudate and are of doubtful significance. 3. 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 Medications Administered: Cefepime and vancomycin.  Vitamin K, Kcentra, Levophed  PCCM asked to admit the patient for further work-up and treatment.  Vascular surgery has been consulted.  Please see "significant hospital events" section below for full detailed hospital course. Pertinent  Medical History   Past Medical History:  Diagnosis Date   Anemia    Anxiety    Breast cancer ([place]) 01/2016   bilateral   CHF (congestive heart failure) (HCC)    Chronic kidney disease    Depression    Dialysis patient [facility])    DVT (deep venous thrombosis) ([place])    left leg   DVT (deep venous thrombosis) ([place]) 1985   right thigh  Dysrhythmia    Gout    Headache    HTN (hypertension)     Hypertension    Parathyroid abnormality (HCC)    Parathyroid disease ([place])    Pneumonia 12/2015   Psoriasis    Renal insufficiency    Sickle cell trait ([place])    traits     Micro Data:  10/30: SARS-CoV-2 PCR>>negative 10/30: Urine>> 10/30: Blood culture x2>>negative  10/30: MRSA PCR>>negative  Antimicrobials:  Cefepime 10/30 x1 dose Flagyl 10/30 x1 dose Vancomycin 10/30 x1 dose  Significant Hospital Events: Including procedures, antibiotic start and stop dates in addition to other pertinent events   10/30: Presented to ED, found to have hemorrhagic shock due to right renal hemorrhage.  Coumadin reversed with Kcentra and Vitamin K.  2 units of FFP and 2 unit of pRBCs administered.  Vascular Surgery performed embolization of right renal artery 10/31: Weaned off pressors, lactic normal. New thrombocytopenia, DIC workup negative.  Continues to complain of significant right sided abdominal/flank pain, will repeat CT Abdomen & Pelvis.  Transfuse 1 unit pRBCs for Hgb 6.5 10/31: [place] Bilateral Carotid revealed right carotid artery        system: Less than 50% stenosis secondary to mild multifocal        atherosclerotic plaque formation. Left carotid artery system:        Less than 50% stenosis secondary to mild multifocal        atherosclerotic plaque formation. Vertebral artery system:        Patent with antegrade flow bilaterally 11/1: Pt remains on amiodarone gtt and precedex gtt.  States pain is better controlled.  In sinus rhythm rate controlled   Interim History / Subjective:  As outlined above   Objective   Blood pressure (!) 88/60, pulse 80, temperature 98.2 F (36.8 C), temperature source Oral, resp. rate 17, weight 59.9 kg, SpO2 98 %.        Intake/Output Summary (Last 24 hours) at 11/07/2021 3785 Last data filed at 11/07/2021 0600 Gross per 24 hour  Intake 900.98 ml  Output --  Net 900.98 ml   Filed Weights   11/05/21 0705 11/06/21 0340 11/07/21 0500  Weight: 57.2 kg  57.9 kg 59.9 kg    Examination: General: Acute on chronically-ill appearing female, NAD resting in bed  HENT: Small hematoma to the right forehead, normocephalic, neck supple, no JVD Lungs: Clear breath sounds throughout, even, non labored  Cardiovascular: NSR, rrr, no r/g, 2+ radial/1+ distal pulses, no edema  Abdomen: Faint BS x4, obese, severe RUQ/RLQ tenderness  Extremities: Normal bulk and tone, no deformities, no edema Neuro: Alert and oriented, following commands, PERRLA GU: Deferred, patient reports she is anuric  Resolved Hospital Problem list     Assessment & Plan:   Hemorrhagic shock in the setting of right renal hemorrhage~ RESOLVED Afibb with RVR~resolved  Intermittent runs of Vtach~resolved  Hypotension: Cardiogenic and hemorrhagic shock  PMHx: HFpEF, DVT, A. Fib on Warfarin, HTN Echocardiogram 11/06/21: EF 55 to 88%; grade I diastolic dysfunction  -CT Head on presentation negative for acute intracranial abnormality - Continuous cardiac monitoring - Cautious IV fluids - Blood transfusions as indicated - Vasopressors as needed to maintain MAP >65 - HS Troponin negative x3 - Cardiology consulted appreciate input: currently on amiodarone gtt  - Vascular surgery is following, appreciate input~s/p embolization of the right renal artery 10/30  Acute blood loss anemia Supratherapeutic INR s/p coumadin reversal~IMPROVED Thrombocytopenia PMHx: Pt on outpatient coumadin for A.fib & DVT - Monitor for  S/Sx of bleeding - Trend CBC (H&H q6h) - SCD's for VTE Prophylaxis  - Transfuse for Hgb <7  - Continue to follow coags and INR - DIC workup negative (normal fibrinogen, no schistocytes)  ESRD on Hemodialysis (M,W,F) Mild hyperkalemia  - Trend BMP - Replace electrolytes as indicated  - Nephrology consulted appreciate input: plans for HD today   Mild Leukocytosis, suspect reactive  - Trend wbc and monitor fever curve  - Follow cultures as above - No indication for  abx this time but low threshold to initiate  Hyperglycemia   Hgb A1c ~ 5.6 - CBG's q4h; Target range of 140 to 180 - SSI - Follow ICU Hypo/Hyperglycemia protocol  Abdominal Pain  - Prn dilaudid for pain management   Anxiety  - Continue low dose precedex gtt   Best Practice (right click and "Reselect all SmartList Selections" daily)  Diet/type: Full Liquid Diet will advance as tolerated  DVT prophylaxis: SCD GI prophylaxis: N/A Lines: Central line and yes and it is still needed Foley:  N/A Code Status:  full code Last date of multidisciplinary goals of care discussion [11/07/21]  11/1: Updated pt regarding plan of care and all questions answered  Labs   CBC: Recent Labs  Lab 11/05/21 0320 11/05/21 1003 11/06/21 0435 11/06/21 0436 11/06/21 0552 11/06/21 0950 11/06/21 1859 11/06/21 2357 11/07/21 0512 11/07/21 0612  WBC 12.9*  --  13.4* 13.7*  --   --   --   --  14.4*  --   NEUTROABS 9.5*  --  10.6*  --   --   --   --   --  11.8*  --   HGB 8.0*   < > 7.3* 6.9*   < > 6.5* 7.6* 7.2* 6.9* 7.0*  HCT 25.1*   < > 21.1* 20.8*   < > 19.3* 22.3* 20.7* 20.3* 20.2*  MCV 87.5  --  80.8 84.9  --   --   --   --  82.9  --   PLT 174  --  73* 87*  --   --   --   --  81*  --    < > = values in this interval not displayed.    Basic Metabolic Panel: Recent Labs  Lab 11/05/21 0320 11/06/21 0436 11/06/21 1526 11/07/21 0512  NA 141 138  --  135  K 4.7 4.6 4.7 5.2*  CL 107 100  --  99  CO2 19* 26  --  24  GLUCOSE 185* 105*  --  97  BUN 65* 49*  --  63*  CREATININE 10.38* 8.25*  --  10.10*  CALCIUM 7.8* 8.3*  --  8.8*  MG  --  1.8 1.7 1.9  PHOS  --  7.1*  --  8.6*   GFR: Estimated Creatinine Clearance: 4.9 mL/min (A) (by C-G formula based on SCr of 10.1 mg/dL (H)). Recent Labs  Lab 11/05/21 0320 11/05/21 0450 11/05/21 1003 11/05/21 1322 11/05/21 2050 11/06/21 0435 11/06/21 0436 11/06/21 0821 11/07/21 0512  PROCALCITON  --   --   --   --   --   --  2.08  --   --   WBC  12.9*  --   --   --   --  13.4* 13.7*  --  14.4*  LATICACIDVEN 3.8*   < > 4.6* 1.5 1.2  --   --  1.2  --    < > = values in this interval not displayed.  Liver Function Tests: Recent Labs  Lab 11/05/21 0320 11/07/21 0512  AST 19  --   ALT 11  --   ALKPHOS 58  --   BILITOT 0.5  --   PROT 5.1*  --   ALBUMIN 2.7* 2.8*   No results for input(s): "LIPASE", "AMYLASE" in the last 168 hours. No results for input(s): "AMMONIA" in the last 168 hours.  ABG    Component Value Date/Time   PHART 7.35 07/27/2021 1218   PCO2ART 41 07/27/2021 1218   PO2ART 274 (H) 07/27/2021 1218   HCO3 22.6 07/27/2021 1218   ACIDBASEDEF 2.9 (H) 07/27/2021 1218   O2SAT 99.9 07/27/2021 1218     Coagulation Profile: Recent Labs  Lab 11/05/21 0450 11/05/21 1003 11/06/21 0436 11/07/21 0512  INR 9.2* 1.4* 1.4* 1.5*    Cardiac Enzymes: No results for input(s): "CKTOTAL", "CKMB", "CKMBINDEX", "TROPONINI" in the last 168 hours.  HbA1C: Hemoglobin A1C  Date/Time Value Ref Range Status  12/29/2013 05:04 AM 5.0 4.2 - 6.3 % Final    Comment:    The American Diabetes Association recommends that a primary goal of therapy should be <7% and that physicians should reevaluate the treatment regimen in patients with HbA1c values consistently >8%.    Hgb A1c MFr Bld  Date/Time Value Ref Range Status  11/06/2021 04:36 AM 5.6 4.8 - 5.6 % Final    Comment:    (NOTE) Pre diabetes:          5.7%-6.4%  Diabetes:              >6.4%  Glycemic control for   <7.0% adults with diabetes   07/08/2021 05:05 PM 4.9 4.8 - 5.6 % Final    Comment:    (NOTE)         Prediabetes: 5.7 - 6.4         Diabetes: >6.4         Glycemic control for adults with diabetes: <7.0     CBG: Recent Labs  Lab 11/06/21 1404 11/06/21 1610 11/06/21 1923 11/06/21 2346 11/07/21 0354  GLUCAP 96 75 78 93 89    Review of Systems:   Positives in BOLD: Gen: Denies fever, chills, weight change, fatigue, night sweats HEENT:  Denies blurred vision, double vision, hearing loss, tinnitus, sinus congestion, rhinorrhea, sore throat, neck stiffness, dysphagia PULM: Denies shortness of breath, cough, sputum production, hemoptysis, wheezing CV: Denies chest pain, edema, orthopnea, paroxysmal nocturnal dyspnea, palpitations GI: Denies abdominal/flank pain, nausea, vomiting, diarrhea, hematochezia, melena, constipation, change in bowel habits GU: Denies dysuria, hematuria, polyuria, oliguria, urethral discharge Endocrine: Denies hot or cold intolerance, polyuria, polyphagia or appetite change Derm: Denies rash, dry skin, scaling or peeling skin change Heme: Denies easy bruising, bleeding, bleeding gums Neuro: Denies headache, numbness, weakness, slurred speech, loss of memory or consciousness  Past Medical History:  She,  has a past medical history of Anemia, Anxiety, Breast cancer ([place]) (01/2016), CHF (congestive heart failure) ([place]), Chronic kidney disease, Depression, Dialysis patient ([place]), DVT (deep venous thrombosis) ([place]), DVT (deep venous thrombosis) ([place]) (1985), Dysrhythmia, Gout, Headache, HTN (hypertension), Hypertension, Parathyroid abnormality ([place]), Parathyroid disease ([place]), Pneumonia (12/2015), Psoriasis, Renal insufficiency, and Sickle cell trait ([place]).   Surgical History:   Past Surgical History:  Procedure Laterality Date   ABDOMINAL HYSTERECTOMY  1980   APPENDECTOMY     BREAST BIOPSY Left 10/28/2013   benign   BREAST EXCISIONAL BIOPSY Left 2002   benign   CORONARY ANGIOGRAPHY N/A 04/04/2020   Procedure: CORONARY  ANGIOGRAPHY;  Surgeon: Wellington Hampshire, MD;  Location: [facility];  Service: Cardiovascular;  Laterality: N/A;   INSERTION OF DIALYSIS CATHETER  2014   LIPOMA EXCISION N/A 01/23/2016   Procedure: EXCISION LIPOMA;  Surgeon: Hubbard Robinson, MD;  Location: [facility];  Service: General;  Laterality: N/A;   MASTECTOMY W/ SENTINEL NODE BIOPSY Bilateral 01/23/2016   Procedure:  bilateral MASTECTOMY WITH  bilateral SENTINEL LYMPH NODE BIOPSY possible left axillary node dissection forehead lipoma removal;  Surgeon: Hubbard Robinson, MD;  Location: [facility];  Service: General;  Laterality: Bilateral;   PARTIAL HYSTERECTOMY     PERIPHERAL VASCULAR CATHETERIZATION N/A 12/25/2015   Procedure: Dialysis/Perma Catheter Insertion;  Surgeon: Algernon Huxley, MD;  Location: [facility];  Service: Cardiovascular;  Laterality: N/A;   PERIPHERAL VASCULAR CATHETERIZATION Left 01/22/2016   Procedure: Dialysis/Perma Catheter Insertion;  Surgeon: Algernon Huxley, MD;  Location: [facility];  Service: Cardiovascular;  Laterality: Left;   PERIPHERAL VASCULAR CATHETERIZATION N/A 01/26/2016   Procedure: Dialysis/Perma Catheter Insertion;  Surgeon: Katha Cabal, MD;  Location: [facility];  Service: Cardiovascular;  Laterality: N/A;   PORT-A-CATH REMOVAL N/A 12/20/2015   Procedure: REMOVAL PORT-A-CATH;  Surgeon: Hubbard Robinson, MD;  Location: [facility];  Service: General;  Laterality: N/A;  left     PORTACATH PLACEMENT Left 08/21/2015   Procedure: INSERTION PORT-A-CATH;  Surgeon: Hubbard Robinson, MD;  Location: [facility];  Service: General;  Laterality: Left;   REMOVAL OF A DIALYSIS CATHETER  2017   RENAL ANGIOGRAPHY Right 11/05/2021   Procedure: RENAL ANGIOGRAPHY;  Surgeon: Algernon Huxley, MD;  Location: [facility];  Service: Cardiovascular;  Laterality: Right;  with embolization   RIGHT HEART CATH N/A 04/04/2020   Procedure: RIGHT HEART CATH;  Surgeon: Wellington Hampshire, MD;  Location: [facility];  Service: Cardiovascular;  Laterality: N/A;     Social History:   reports that she has never smoked. She has never used smokeless tobacco. She reports that she does not drink alcohol and does not use drugs.   Family History:  Her family history includes Breast cancer (age of onset: 49) in her maternal aunt; CVA in her mother; Cancer (age of onset:  46) in her sister; Diabetes in her sister; Hypertension in her brother, father, mother, and sister; Stroke in her brother and mother.   Allergies Allergies  Allergen Reactions   Gabapentin Other (See Comments)    Seizure   Adhesive [Tape] Itching    Silk tape is ok to use.     Home Medications  Prior to Admission medications   Medication Sig Start Date End Date Taking? Authorizing Provider  albuterol (VENTOLIN HFA) 108 (90 Base) MCG/ACT inhaler Inhale 2 puffs into the lungs every 4 (four) hours as needed for wheezing or shortness of breath. Patient not taking: Reported on 07/27/2021 10/26/20   Sharen Hones, MD  aspirin-acetaminophen-caffeine [facility] MIGRAINE) [id number] MG tablet Take 1-2 tablets by mouth every 6 (six) hours as needed for headache.    [provider]  atorvastatin (LIPITOR) 40 MG tablet Take 1 tablet (40 mg total) by mouth daily. 07/31/21   Loletha Grayer, MD  b complex-vitamin c-folic acid (NEPHRO-VITE) 0.8 MG TABS tablet Take 1 tablet by mouth daily.    [provider]  calcium carbonate (TUMS - DOSED IN MG ELEMENTAL CALCIUM) 500 MG chewable tablet Chew 1 tablet (200 mg of elemental calcium total) by mouth 2 (two)  times daily as needed for indigestion or heartburn. 07/30/21   Loletha Grayer, MD  carvedilol (COREG) 12.5 MG tablet Take 1 tablet (12.5 mg total) by mouth 2 (two) times daily. 07/30/21 08/29/21  Loletha Grayer, MD  cinacalcet (SENSIPAR) 60 MG tablet Take 60 mg by mouth daily before lunch.    [provider]  hydrALAZINE (APRESOLINE) 25 MG tablet Take 1 tablet (25 mg total) by mouth 3 (three) times daily. 07/30/21   Loletha Grayer, MD  isosorbide mononitrate (IMDUR) 30 MG 24 hr tablet Take 1 tablet (30 mg total) by mouth daily. 07/31/21   Loletha Grayer, MD  megestrol (MEGACE) 400 MG/10ML suspension Take 5 mLs (200 mg total) by mouth daily. 07/30/21   Loletha Grayer, MD  pantoprazole (PROTONIX) 40 MG tablet Take 1 tablet (40 mg  total) by mouth 2 (two) times daily. 07/30/21   Loletha Grayer, MD  PARoxetine (PAXIL) 40 MG tablet Take 1 tablet (40 mg total) by mouth daily. 07/30/21 08/29/21  Loletha Grayer, MD  sacubitril-valsartan (ENTRESTO) 24-26 MG Take 1 tablet by mouth 2 (two) times daily. 02/15/21   Kate Sable, MD  sevelamer carbonate (RENVELA) 800 MG tablet Take 3 tablets (2,400 mg total) by mouth with breakfast, with lunch, and with evening meal. One with snacks 07/30/21   Loletha Grayer, MD  traZODone (DESYREL) 50 MG tablet Take 50 mg by mouth at bedtime. 03/01/21   [provider]  warfarin (COUMADIN) 5 MG tablet Take 1-1.5 tablets (5-7.5 mg total) by mouth See admin instructions. Take 1 tablet ('5mg'$ ) by mouth every Monday, Wednesday, Friday, Saturday and Sunday evening and take 1.5  tablets (7.'5mg'$ ) by mouth every Tuesday and Thursday evening 10/26/20   Sharen Hones, MD  zolpidem (AMBIEN) 10 MG tablet Take 0.5 tablets (5 mg total) by mouth at bedtime. 07/30/21   Loletha Grayer, MD     Critical care time: 35 minutes     Donell Beers, [place] Pager [phone] (please enter 7 digits) PCCM Consult Pager [phone] (please enter 7 digits)

## 2021-11-07 NOTE — Consult Note (Signed)
MEDICATION RELATED CONSULT NOTE  Pharmacy Consult for Post-Kcentra monitoring Indication: Warfarin reversal  Labs: Recent Labs    11/05/21 0320 11/05/21 0450 11/05/21 1003 11/06/21 0435 11/06/21 0436 11/06/21 0552 11/06/21 1526 11/06/21 1859 11/06/21 2357 11/07/21 0512 11/07/21 0612  WBC 12.9*  --   --  13.4* 13.7*  --   --   --   --  14.4*  --   HGB 8.0*  --    < > 7.3* 6.9*   < >  --    < > 7.2* 6.9* 7.0*  HCT 25.1*  --    < > 21.1* 20.8*   < >  --    < > 20.7* 20.3* 20.2*  PLT 174  --   --  73* 87*  --   --   --   --  81*  --   APTT  --  36  --   --   --   --   --   --   --   --   --   CREATININE 10.38*  --   --   --  8.25*  --   --   --   --  10.10*  --   MG  --   --   --   --  1.8  --  1.7  --   --  1.9  --   PHOS  --   --   --   --  7.1*  --   --   --   --  8.6*  --   ALBUMIN 2.7*  --   --   --   --   --   --   --   --  2.8*  --   PROT 5.1*  --   --   --   --   --   --   --   --   --   --   AST 19  --   --   --   --   --   --   --   --   --   --   ALT 11  --   --   --   --   --   --   --   --   --   --   ALKPHOS 58  --   --   --   --   --   --   --   --   --   --   BILITOT 0.5  --   --   --   --   --   --   --   --   --   --    < > = values in this interval not displayed.    Estimated Creatinine Clearance: 4.9 mL/min (A) (by C-G formula based on SCr of 10.1 mg/dL (H)).  Medical History: Past Medical History:  Diagnosis Date   Anemia    Anxiety    Breast cancer ([place]) 01/2016   bilateral   CHF (congestive heart failure) (HCC)    Chronic kidney disease    Depression    Dialysis patient [facility])    DVT (deep venous thrombosis) ([place])    left leg   DVT (deep venous thrombosis) ([place]) 1985   right thigh   Dysrhythmia    Gout    Headache    HTN (hypertension)    Hypertension    Parathyroid abnormality ([place])    Parathyroid  disease ([place])    Pneumonia 12/2015   Psoriasis    Renal insufficiency    Sickle cell trait ([place])    traits    Medications:  Warfarin  prior to admission. 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 Patient subsequently found to have severe acute right renal hemorrhage in setting of supratherapeutic INR on warfarin. Patient ordered emergent reversal with Kcentra and vitamin K.  Goal of Therapy:  INR <= 1.4   Date    INR 10/30 (on admission)  9.2  10/30 (1 hour post-Kcentra) 1.4 10/31    1.4 11/1    1.5  Plan: INR continues to be stable. Full reversal maintained   Teresa Cook 11/07/2021,8:45 AM

## 2021-11-08 ENCOUNTER — Inpatient Hospital Stay: Payer: Medicare Other

## 2021-11-08 DIAGNOSIS — R109 Unspecified abdominal pain: Secondary | ICD-10-CM

## 2021-11-08 DIAGNOSIS — Z515 Encounter for palliative care: Secondary | ICD-10-CM | POA: Diagnosis not present

## 2021-11-08 DIAGNOSIS — R579 Shock, unspecified: Secondary | ICD-10-CM

## 2021-11-08 DIAGNOSIS — A419 Sepsis, unspecified organism: Secondary | ICD-10-CM

## 2021-11-08 DIAGNOSIS — R4182 Altered mental status, unspecified: Secondary | ICD-10-CM

## 2021-11-08 DIAGNOSIS — I480 Paroxysmal atrial fibrillation: Secondary | ICD-10-CM | POA: Diagnosis not present

## 2021-11-08 DIAGNOSIS — Z7189 Other specified counseling: Secondary | ICD-10-CM | POA: Diagnosis not present

## 2021-11-08 DIAGNOSIS — I9589 Other hypotension: Secondary | ICD-10-CM | POA: Diagnosis not present

## 2021-11-08 DIAGNOSIS — R6521 Severe sepsis with septic shock: Secondary | ICD-10-CM | POA: Diagnosis not present

## 2021-11-08 DIAGNOSIS — R7989 Other specified abnormal findings of blood chemistry: Secondary | ICD-10-CM

## 2021-11-08 DIAGNOSIS — I959 Hypotension, unspecified: Secondary | ICD-10-CM | POA: Diagnosis not present

## 2021-11-08 DIAGNOSIS — R58 Hemorrhage, not elsewhere classified: Secondary | ICD-10-CM

## 2021-11-08 DIAGNOSIS — N186 End stage renal disease: Secondary | ICD-10-CM | POA: Diagnosis not present

## 2021-11-08 DIAGNOSIS — D62 Acute posthemorrhagic anemia: Secondary | ICD-10-CM

## 2021-11-08 LAB — CBC WITH DIFFERENTIAL/PLATELET
Abs Immature Granulocytes: 0.13 10*3/uL — ABNORMAL HIGH (ref 0.00–0.07)
Basophils Absolute: 0 10*3/uL (ref 0.0–0.1)
Basophils Relative: 0 %
Eosinophils Absolute: 0.1 10*3/uL (ref 0.0–0.5)
Eosinophils Relative: 1 %
HCT: 23 % — ABNORMAL LOW (ref 36.0–46.0)
Hemoglobin: 8 g/dL — ABNORMAL LOW (ref 12.0–15.0)
Immature Granulocytes: 1 %
Lymphocytes Relative: 7 %
Lymphs Abs: 0.8 10*3/uL (ref 0.7–4.0)
MCH: 28.6 pg (ref 26.0–34.0)
MCHC: 34.8 g/dL (ref 30.0–36.0)
MCV: 82.1 fL (ref 80.0–100.0)
Monocytes Absolute: 1.2 10*3/uL — ABNORMAL HIGH (ref 0.1–1.0)
Monocytes Relative: 9 %
Neutro Abs: 10.4 10*3/uL — ABNORMAL HIGH (ref 1.7–7.7)
Neutrophils Relative %: 82 %
Platelets: 89 10*3/uL — ABNORMAL LOW (ref 150–400)
RBC: 2.8 MIL/uL — ABNORMAL LOW (ref 3.87–5.11)
RDW: 15.7 % — ABNORMAL HIGH (ref 11.5–15.5)
WBC: 12.6 10*3/uL — ABNORMAL HIGH (ref 4.0–10.5)
nRBC: 0 % (ref 0.0–0.2)

## 2021-11-08 LAB — BASIC METABOLIC PANEL
Anion gap: 9 (ref 5–15)
BUN: 34 mg/dL — ABNORMAL HIGH (ref 8–23)
CO2: 28 mmol/L (ref 22–32)
Calcium: 9.3 mg/dL (ref 8.9–10.3)
Chloride: 96 mmol/L — ABNORMAL LOW (ref 98–111)
Creatinine, Ser: 5.91 mg/dL — ABNORMAL HIGH (ref 0.44–1.00)
GFR, Estimated: 7 mL/min — ABNORMAL LOW (ref >60)
Glucose, Bld: 116 mg/dL — ABNORMAL HIGH (ref 70–99)
Potassium: 4 mmol/L (ref 3.5–5.1)
Sodium: 133 mmol/L — ABNORMAL LOW (ref 135–145)

## 2021-11-08 LAB — PHOSPHORUS: Phosphorus: 5 mg/dL — ABNORMAL HIGH (ref 2.5–4.6)

## 2021-11-08 LAB — GLUCOSE, CAPILLARY
Glucose-Capillary: 101 mg/dL — ABNORMAL HIGH (ref 70–99)
Glucose-Capillary: 107 mg/dL — ABNORMAL HIGH (ref 70–99)
Glucose-Capillary: 110 mg/dL — ABNORMAL HIGH (ref 70–99)
Glucose-Capillary: 114 mg/dL — ABNORMAL HIGH (ref 70–99)
Glucose-Capillary: 118 mg/dL — ABNORMAL HIGH (ref 70–99)
Glucose-Capillary: 122 mg/dL — ABNORMAL HIGH (ref 70–99)
Glucose-Capillary: 98 mg/dL (ref 70–99)

## 2021-11-08 LAB — HEMOGLOBIN AND HEMATOCRIT, BLOOD
HCT: 24.3 % — ABNORMAL LOW (ref 36.0–46.0)
HCT: 26.6 % — ABNORMAL LOW (ref 36.0–46.0)
Hemoglobin: 8.3 g/dL — ABNORMAL LOW (ref 12.0–15.0)
Hemoglobin: 9.3 g/dL — ABNORMAL LOW (ref 12.0–15.0)

## 2021-11-08 LAB — MAGNESIUM: Magnesium: 1.7 mg/dL (ref 1.7–2.4)

## 2021-11-08 LAB — PROTIME-INR
INR: 1.5 — ABNORMAL HIGH (ref 0.8–1.2)
Prothrombin Time: 17.5 seconds — ABNORMAL HIGH (ref 11.4–15.2)

## 2021-11-08 MED ORDER — MAGNESIUM SULFATE 2 GM/50ML IV SOLN
2.0000 g | Freq: Once | INTRAVENOUS | Status: AC
  Administered 2021-11-08: 2 g via INTRAVENOUS
  Filled 2021-11-08: qty 50

## 2021-11-08 NOTE — Evaluation (Signed)
Physical Therapy Evaluation Patient Details Name: Teresa Cook MRN: [id number] DOB: [birth year] Today's Date: 11/08/2021  History of Present Illness  Patient is a 66 year old female with medical history significant for ESRD on HD, A. Fib & DVT on Warfarin , admitted with Hemorrhagic shock due to right renal hemorrhage with large hematoma in setting of supratherapeutic INR (INR >9).  Vascular Surgery consulted, required coil embolization of the right renal artery.   Clinical Impression  Patient was agreeable to PT evaluation. She reports just having pain medication recently. She states she is independent at baseline and drives herself to outpatient dialysis. She lives alone.  Today, patient required minimal assistance for bed mobility and for standing x 2 bouts. Mild dizziness reported with upright activity that did not worsen with activity progression. Fair standing balance with standing tolerance of 1 minute. Heart rate in the 80's-90's with activity with Sp02 mid 90's. Respiration rate does increase to 33 with activity and decreased to 28 or less with rest break and cues for slow, deep breathing. The patient wants to return home at discharge. Recommend HHPT. PT will continue to follow to maximize independence and facilitate return to prior level of function.      Recommendations for follow up therapy are one component of a multi-disciplinary discharge planning process, led by the attending physician.  Recommendations may be updated based on patient status, additional functional criteria and insurance authorization.  Follow Up Recommendations Home health PT      Assistance Recommended at Discharge Intermittent Supervision/Assistance  Patient can return home with the following  A little help with walking and/or transfers;A little help with bathing/dressing/bathroom;Help with stairs or ramp for entrance;Assist for transportation;Assistance with cooking/housework    Equipment Recommendations  Rolling walker (2 wheels)  Recommendations for Other Services       Functional Status Assessment Patient has had a recent decline in their functional status and demonstrates the ability to make significant improvements in function in a reasonable and predictable amount of time.     Precautions / Restrictions Precautions Precautions: Fall Restrictions Weight Bearing Restrictions: No      Mobility  Bed Mobility Overal bed mobility: Needs Assistance Bed Mobility: Supine to Sit, Sit to Supine     Supine to sit: Min guard Sit to supine: Min assist   General bed mobility comments: occasional support for BLE to return to bed. verbal cues for technique. increased time and effort required    Transfers Overall transfer level: Needs assistance Equipment used: None Transfers: Sit to/from Stand Sit to Stand: Min assist           General transfer comment: patient requesting to stand without rolling walker and without assistance from therapist. steadying assistance provided for standing x 2 bouts with short rest break between bouts of standing. heart rate in the high 80's, Sp02 in the mid 90's, and respiration rate increased to 33 with activity    Ambulation/Gait               General Gait Details: not attempted due to fatigue with standing  Stairs            Wheelchair Mobility    Modified Rankin (Stroke Patients Only)       Balance Overall balance assessment: Needs assistance Sitting-balance support: Feet supported Sitting balance-Leahy Scale: Fair     Standing balance support: Single extremity supported Standing balance-Leahy Scale: Fair Standing balance comment: unable to achieve full upright standing position without right groin pain  Pertinent Vitals/Pain Pain Assessment Pain Assessment: Faces Faces Pain Scale: Hurts little more Pain Location: R groin with standing Pain Descriptors / Indicators:  Guarding Pain Intervention(s): Limited activity within patient's tolerance, Monitored during session, Premedicated before session    Home Living Family/patient expects to be discharged to:: Private residence Living Arrangements: Alone Available Help at Discharge: Family Type of Home: House Home Access: Stairs to enter Entrance Stairs-Rails: Left Entrance Stairs-Number of Steps: 3   Home Layout: One level Home Equipment: None      Prior Function Prior Level of Function : Independent/Modified Independent;Driving             Mobility Comments: independent and drives herself to dialysis ADLs Comments: independent with ADL     Hand Dominance   Dominant Hand: Left    Extremity/Trunk Assessment   Upper Extremity Assessment Upper Extremity Assessment: Overall WFL for tasks assessed    Lower Extremity Assessment Lower Extremity Assessment: Generalized weakness (endurance impaired for sustained activity in standing)       Communication   Communication: No difficulties  Cognition Arousal/Alertness: Awake/alert Behavior During Therapy: WFL for tasks assessed/performed Overall Cognitive Status: Within Functional Limits for tasks assessed                                          General Comments      Exercises     Assessment/Plan    PT Assessment Patient needs continued PT services  PT Problem List Decreased strength;Decreased range of motion;Decreased activity tolerance;Decreased balance;Decreased mobility;Decreased cognition;Cardiopulmonary status limiting activity       PT Treatment Interventions DME instruction;Gait training;Stair training;Functional mobility training;Therapeutic activities;Therapeutic exercise;Balance training;Neuromuscular re-education;Patient/family education    PT Goals (Current goals can be found in the Care Plan section)  Acute Rehab PT Goals Patient Stated Goal: to get better and go home PT Goal Formulation: With  patient Time For Goal Achievement: 11/22/21 Potential to Achieve Goals: Fair    Frequency Min 2X/week     Co-evaluation               AM-PAC PT "6 Clicks" Mobility  Outcome Measure Help needed turning from your back to your side while in a flat bed without using bedrails?: None Help needed moving from lying on your back to sitting on the side of a flat bed without using bedrails?: A Little Help needed moving to and from a bed to a chair (including a wheelchair)?: A Little Help needed standing up from a chair using your arms (e.g., wheelchair or bedside chair)?: A Little Help needed to walk in hospital room?: A Little Help needed climbing 3-5 steps with a railing? : A Lot 6 Click Score: 18    End of Session Equipment Utilized During Treatment: Oxygen Activity Tolerance: Patient tolerated treatment well;Patient limited by fatigue Patient left: in bed;with call bell/phone within reach;with bed alarm set;with SCD's reapplied Nurse Communication: Mobility status PT Visit Diagnosis: Unsteadiness on feet (R26.81);Muscle weakness (generalized) (M62.81)    Time: 1194-1740 PT Time Calculation (min) (ACUTE ONLY): 22 min   Charges:   PT Evaluation $PT Eval Moderate Complexity: 1 Mod PT Treatments $Therapeutic Activity: 8-22 mins        Minna Merritts, PT, MPT   Percell Locus 11/08/2021, 10:25 AM

## 2021-11-08 NOTE — Progress Notes (Signed)
[facility]  ROUNDING NOTE   Subjective:   Teresa Cook is a 66 year old African-American female with past medical conditions including hypertension, gout, anxiety with depression, sickle cell trait, systolic heart failure with a EF 40 to 45%, breast cancer with bilateral mastectomy, seizures, and end-stage renal disease on hemodialysis.  Patient presents to the emergency department via EMS for multiple syncopal episodes.  Patient has been admitted for Right flank pain [R10.9] Elevated lactic acid level [R79.89] Hypotension [I95.9] Hypotension, unspecified hypotension type [I95.9] Altered mental status, unspecified altered mental status type [R41.82]  Patient is known to our practice and receives outpatient dialysis treatments at [facility] on a MWF schedule, supervised by Dr. Candiss Norse.    Patient seen resting in bed, was witnessed standing at side of bed with therapy earlier this morning.  Ill appearing Remains on 2L Appetite remains poor Blood pressure remains soft, receiving Amiodarone and phenylephrine Continues to have abd pain, low dose precedex drip.  No lower extremity edema  Objective:  Vital signs in last 24 hours:  Temp:  [97.8 F (36.6 C)-102.5 F (39.2 C)] 99.1 F (37.3 C) (11/02 0800) Pulse Rate:  [69-135] 78 (11/02 1000) Resp:  [17-33] 22 (11/02 1000) BP: (60-131)/(44-89) 92/61 (11/02 1000) SpO2:  [76 %-100 %] 93 % (11/02 1000) Weight:  [61 kg-62.1 kg] 62.1 kg (11/02 0500)  Weight change: 1.7 kg Filed Weights   11/07/21 1015 11/07/21 1352 11/08/21 0500  Weight: 61.6 kg 61 kg 62.1 kg    Intake/Output: I/O last 3 completed shifts: In: 1711.6 [P.O.:230; I.V.:1031.6; Blood:350; IV Piggyback:100] Out: 600 [Other:600]   Intake/Output this shift:  Total I/O In: 167.4 [I.V.:117.4; IV Piggyback:50] Out: -   Physical Exam: General: Ill appearing  Head: Normocephalic, atraumatic. Moist oral mucosal membranes  Eyes: Anicteric  Lungs:   Diminished in bases, normal effort  Heart: Regular rate and rhythm  Abdomen:  Soft, nontender  Extremities:  No peripheral edema.  Neurologic: Nonfocal, moving all four extremities  Skin: No lesions  Access: Lt Permcath    Basic Metabolic Panel: Recent Labs  Lab 11/05/21 0320 11/06/21 0436 11/06/21 1526 11/07/21 0512 11/08/21 0216  NA 141 138  --  135 133*  K 4.7 4.6 4.7 5.2* 4.0  CL 107 100  --  99 96*  CO2 19* 26  --  24 28  GLUCOSE 185* 105*  --  97 116*  BUN 65* 49*  --  63* 34*  CREATININE 10.38* 8.25*  --  10.10* 5.91*  CALCIUM 7.8* 8.3*  --  8.8* 9.3  MG  --  1.8 1.7 1.9 1.7  PHOS  --  7.1*  --  8.6* 5.0*     Liver Function Tests: Recent Labs  Lab 11/05/21 0320 11/07/21 0512  AST 19  --   ALT 11  --   ALKPHOS 58  --   BILITOT 0.5  --   PROT 5.1*  --   ALBUMIN 2.7* 2.8*    No results for input(s): "LIPASE", "AMYLASE" in the last 168 hours. No results for input(s): "AMMONIA" in the last 168 hours.  CBC: Recent Labs  Lab 11/05/21 0320 11/05/21 1003 11/06/21 0435 11/06/21 0436 11/06/21 0552 11/07/21 0512 11/07/21 0612 11/07/21 1215 11/07/21 1901 11/08/21 0216  WBC 12.9*  --  13.4* 13.7*  --  14.4*  --   --   --  12.6*  NEUTROABS 9.5*  --  10.6*  --   --  11.8*  --   --   --  10.4*  HGB 8.0*   < > 7.3* 6.9*   < > 6.9* 7.0* 7.1* 6.9* 8.0*  HCT 25.1*   < > 21.1* 20.8*   < > 20.3* 20.2* 20.3* 20.0* 23.0*  MCV 87.5  --  80.8 84.9  --  82.9  --   --   --  82.1  PLT 174  --  73* 87*  --  81*  --   --   --  89*   < > = values in this interval not displayed.     Cardiac Enzymes: No results for input(s): "CKTOTAL", "CKMB", "CKMBINDEX", "TROPONINI" in the last 168 hours.  BNP: Invalid input(s): "POCBNP"  CBG: Recent Labs  Lab 11/07/21 1633 11/07/21 1954 11/07/21 2352 11/08/21 0342 11/08/21 0720  GLUCAP 120* 114* 118* 110* 69     Microbiology: Results for orders placed or performed during the hospital encounter of 11/05/21  Blood Culture  (routine x 2)     Status: None (Preliminary result)   Collection Time: 11/05/21  3:20 AM   Specimen: BLOOD  Result Value Ref Range Status   Specimen Description BLOOD RIGHT FA  Final   Special Requests   Final    BOTTLES DRAWN AEROBIC AND ANAEROBIC Blood Culture results may not be optimal due to an inadequate volume of blood received in culture bottles   Culture   Final    NO GROWTH 2 DAYS Performed at [facility], [address], [place], [place] [address]    Report Status PENDING  Incomplete  SARS Coronavirus 2 by RT PCR (hospital order, performed in [facility] hospital lab) *cepheid single result test* Anterior Nasal Swab     Status: None   Collection Time: 11/05/21  3:20 AM   Specimen: Anterior Nasal Swab  Result Value Ref Range Status   SARS Coronavirus 2 by RT PCR NEGATIVE NEGATIVE Final    Comment: (NOTE) SARS-CoV-2 target nucleic acids are NOT DETECTED.  The SARS-CoV-2 RNA is generally detectable in upper and lower respiratory specimens during the acute phase of infection. The lowest concentration of SARS-CoV-2 viral copies this assay can detect is 250 copies / mL. A negative result does not preclude SARS-CoV-2 infection and should not be used as the sole basis for treatment or other patient management decisions.  A negative result may occur with improper specimen collection / handling, submission of specimen other than nasopharyngeal swab, presence of viral mutation(s) within the areas targeted by this assay, and inadequate number of viral copies (<250 copies / mL). A negative result must be combined with clinical observations, patient history, and epidemiological information.  Fact Sheet for Patients:   https://www.patel.info/  Fact Sheet for Healthcare Providers: https://hall.com/  This test is not yet approved or  cleared by the [place] FDA and has been authorized for detection and/or diagnosis of  SARS-CoV-2 by FDA under an Emergency Use Authorization (EUA).  This EUA will remain in effect (meaning this test can be used) for the duration of the COVID-19 declaration under Section 564(b)(1) of the Act, 21 U.S.C. section 360bbb-3(b)(1), unless the authorization is terminated or revoked sooner.  Performed at [facility], [address] [place], [place] [address]   Blood Culture (routine x 2)     Status: None (Preliminary result)   Collection Time: 11/05/21  3:21 AM   Specimen: BLOOD  Result Value Ref Range Status   Specimen Description BLOOD RIGHT HAND  Final   Special Requests   Final  BOTTLES DRAWN AEROBIC AND ANAEROBIC Blood Culture results may not be optimal due to an inadequate volume of blood received in culture bottles   Culture   Final    NO GROWTH 2 DAYS Performed at [facility], [address] [place], [place] [address]    Report Status PENDING  Incomplete  MRSA Next Gen by PCR, Nasal     Status: None   Collection Time: 11/05/21  6:57 AM   Specimen: Nasal Mucosa; Nasal Swab  Result Value Ref Range Status   MRSA by PCR Next Gen NOT DETECTED NOT DETECTED Final    Comment: (NOTE) The GeneXpert MRSA Assay (FDA approved for NASAL specimens only), is one component of a comprehensive MRSA colonization surveillance program. It is not intended to diagnose MRSA infection nor to guide or monitor treatment for MRSA infections. Test performance is not FDA approved in patients less than 6 years old. Performed at [facility], [address] [place], [place] [address]     Coagulation Studies: Recent Labs    11/06/21 0436 11/07/21 0512  LABPROT 17.3* 18.0*  INR 1.4* 1.5*     Urinalysis: No results for input(s): "COLORURINE", "LABSPEC", "PHURINE", "GLUCOSEU", "HGBUR", "BILIRUBINUR", "KETONESUR", "PROTEINUR", "UROBILINOGEN", "NITRITE", "LEUKOCYTESUR" in the last 72 hours.  Invalid input(s): "APPERANCEUR"    Imaging: US Carotid  Bilateral  Result Date: 11/06/2021 CLINICAL DATA:  66 year old female with history of syncope. EXAM: BILATERAL CAROTID DUPLEX ULTRASOUND TECHNIQUE: Pearline Cables scale imaging, color Doppler and duplex ultrasound were performed of bilateral carotid and vertebral arteries in the neck. COMPARISON:  None Available. FINDINGS: Criteria: Quantification of carotid stenosis is based on velocity parameters that correlate the residual internal carotid diameter with NASCET-based stenosis levels, using the diameter of the distal internal carotid lumen as the denominator for stenosis measurement. The following velocity measurements were obtained: RIGHT ICA: Peak systolic velocity 277 cm/sec, End diastolic velocity 45 cm/sec CCA: Peak systolic velocity 412 cm/sec SYSTOLIC ICA/CCA RATIO:  1.2 ECA: Peak systolic velocity 72 cm/sec LEFT ICA: Peak systolic velocity 878 cm/sec, End diastolic velocity 57 cm/sec CCA: 676 cm/sec SYSTOLIC ICA/CCA RATIO:  1.3 ECA: 75 cm/sec RIGHT CAROTID ARTERY: Mild multifocal atherosclerotic plaque formation. No significant tortuosity. Normal low resistance waveforms. RIGHT VERTEBRAL ARTERY:  Antegrade flow. LEFT CAROTID ARTERY: Mild multifocal atherosclerotic plaque formation. No significant tortuosity. Normal low resistance waveforms. LEFT VERTEBRAL ARTERY:  Antegrade flow. Upper extremity non-invasive blood pressures: Not obtained. IMPRESSION: 1. Right carotid artery system: Less than 50% stenosis secondary to mild multifocal atherosclerotic plaque formation. 2. Left carotid artery system: Less than 50% stenosis secondary to mild multifocal atherosclerotic plaque formation. 3.  Vertebral artery system: Patent with antegrade flow bilaterally. Ruthann Cancer, MD [facility] Radiology Electronically Signed   By: Ruthann Cancer M.D.   On: 11/06/2021 14:27   CT ABDOMEN PELVIS WO CONTRAST  Result Date: 11/06/2021 CLINICAL DATA:  Retroperitoneal hematoma, follow  up EXAM: CT ABDOMEN AND PELVIS WITHOUT CONTRAST TECHNIQUE: Multidetector CT imaging of the abdomen and pelvis was performed following the standard protocol without IV contrast. RADIATION DOSE REDUCTION: This exam was performed according to the departmental dose-optimization program which includes automated exposure control, adjustment of the mA and/or kV according to patient size and/or use of iterative reconstruction technique. COMPARISON:  CTA 11/05/2021. FINDINGS: Lower chest: Small bilateral pleural effusions with adjacent atelectasis, slightly increased from prior exam. Partially visualized ascending aortic aneurysm, see recent CTA of the chest 11/05/2021. Hepatobiliary: No focal liver abnormality is seen. The gallbladder  is distended with vicarious excretion of contrast. Pancreas: No ductal dilation or peripancreatic inflammatory change. Spleen: Normal in size without focal abnormality. Adrenals/Urinary Tract: Adrenal glands are unremarkable. Acquired cystic renal disease of dialysis. There is a right renal and pararenal retroperitoneal hematoma, which appears more hyperdense in areas in comparison to recent CT, likely reflecting developing clot. There is intraperitoneal hemorrhage as well with blood products in the pericolic gutters, perihepatic, perisplenic spaces, as well as in the pelvis. Overall volume of abdominopelvic hemoperitoneum similar to recent CT. Stomach/Bowel: The stomach is within normal limits. There is no evidence of bowel obstruction.Prior appendectomy, per medical record. Vascular/Lymphatic: Aorta iliac atherosclerosis. Interval coil embolization of the right renal vasculature. Reproductive: Prior hysterectomy. Other: There is a pressure dressing applied to the right groin. There is a left femoral angio catheter with tip terminating a left common iliac vein. Musculoskeletal: No acute osseous abnormality. No suspicious osseous lesion. Renal osteodystrophy. IMPRESSION: Evolving right-sided  retroperitoneal hematoma, with overall similar volume of hemoperitoneum in comparison to recent CT. Interval right renal vascular coil embolization. Small bilateral pleural effusions with adjacent atelectasis, slightly increased from prior exam. 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 Electronically Signed   By: Maurine Simmering M.D.   On: 11/06/2021 13:47   DG Abd 1 View  Result Date: 11/06/2021 CLINICAL DATA:  Abdominal pain EXAM: ABDOMEN - 1 VIEW COMPARISON:  11/05/2021 FINDINGS: Left femoral catheter is identified with tip projecting over the lower abdomen just below the level of the aortic bifurcation. Embolization coils are noted within the right upper abdomen. The bowel gas pattern appears nonobstructed. There are a few prominent air-filled loops of bowel noted in the right lower quadrant of the abdomen. Gas and stool noted within the colon up to the level of the rectum. IMPRESSION: 1. Nonobstructive bowel gas pattern. 2. Left femoral catheter tip projects over the lower abdomen just below the level of the aortic bifurcation. Electronically Signed   By: Kerby Moors M.D.   On: 11/06/2021 10:44     Medications:    sodium chloride Stopped (11/05/21 0939)   amiodarone 30 mg/hr (11/08/21 1000)   anticoagulant sodium citrate     dexmedetomidine (PRECEDEX) IV infusion 0.7 mcg/kg/hr (11/08/21 1000)   phenylephrine (NEO-SYNEPHRINE) Adult infusion 15 mcg/min (11/08/21 1000)   piperacillin-tazobactam (ZOSYN)  IV 2.25 g (11/08/21 0510)    Chlorhexidine Gluconate Cloth  6 each Topical Q0600   dextrose  12.5 g Intravenous STAT   feeding supplement (NEPRO CARB STEADY)  237 mL Oral TID BM   multivitamin  1 tablet Oral QHS   acetaminophen, alteplase, anticoagulant sodium citrate, docusate sodium, heparin, HYDROmorphone (DILAUDID) injection, lidocaine (PF), lidocaine-prilocaine, mouth rinse, oxyCODONE, pentafluoroprop-tetrafluoroeth, polyethylene glycol  Assessment/ Plan:  Ms.  Griselle Rufer is a 66 y.o.  female is a 66 year old African-American female with past medical conditions including hypertension, gout, anxiety with depression, sickle cell trait, systolic heart failure with a EF 40 to 45%, breast cancer with bilateral mastectomy, seizures, and end-stage renal disease on hemodialysis.  Patient presents to the emergency department via EMS for multiple syncopal episodes.  Patient has been admitted for Right flank pain [R10.9] Elevated lactic acid level [R79.89] Hypotension [I95.9] Hypotension, unspecified hypotension type [I95.9] Altered mental status, unspecified altered mental status type [R41.82]  [facility]/MWF/Lt Permcath  End-stage renal disease on hemodialysis.  Will maintain outpatient schedule if possible.  Patient received dialysis yesterday, UF goal reduced from 1 L to 700 mL achieved due to hypotension.  Patient received pressor increase during treatment.  Per cardiology, patient complaining of shortness of breath at this time.  We will schedule additional treatment today for fluid removal as tolerated.  2. Anemia of chronic kidney disease with acute blood loss Lab Results  Component Value Date   HGB 8.0 (L) 11/08/2021  CT angio abdomen pelvis shows severe hemorrhage at right kidney.  Appreciate vascular surgery performing right renal angiogram with coil embolization to manage hemorrhage.  Patient received 2 units FFP and blood transfusions thus far during admission.  Hemoglobin improving slowly.  Patient received 1 unit blood transfusion yesterday.   3. Secondary Hyperparathyroidism: with outpatient labs: PTH 379, phosphorus 5.7, calcium 9.4 on June 18, 2021.   Lab Results  Component Value Date   PTH 214 (H) 07/08/2021   CALCIUM 9.3 11/08/2021   PHOS 5.0 (H) 11/08/2021    Calcium and phosphorus within acceptable range.  Continue sevelamer with all meals.  4.  Hypotension likely due to blood loss, patient required Neo support with  dialysis yesterday.  Currently remains on Neo, may require increase with scheduled dialysis.  5.  Hyperkalemia, potassium corrected with dialysis yesterday.   LOS: 3 Aymara Sassi 11/2/202310:21 AM

## 2021-11-08 NOTE — Progress Notes (Signed)
Progress Note  Patient Name: Teresa Cook Date of Encounter: 11/08/2021  Primary Cardiologist: new - consult by End  Subjective   No chest pain. Some dyspnea. Maintaining sinus rhythm. HGB 6.9 to 8.0. Redeveloped Afib during rounding.    Inpatient Medications    Scheduled Meds:  Chlorhexidine Gluconate Cloth  6 each Topical Q0600   dextrose  12.5 g Intravenous STAT   feeding supplement (NEPRO CARB STEADY)  237 mL Oral TID BM   multivitamin  1 tablet Oral QHS   Continuous Infusions:  sodium chloride Stopped (11/05/21 0939)   amiodarone 30 mg/hr (11/08/21 0600)   anticoagulant sodium citrate     dexmedetomidine (PRECEDEX) IV infusion 0.7 mcg/kg/hr (11/08/21 0600)   magnesium sulfate bolus IVPB 2 g (11/08/21 0756)   phenylephrine (NEO-SYNEPHRINE) Adult infusion 20 mcg/min (11/08/21 0600)   piperacillin-tazobactam (ZOSYN)  IV 2.25 g (11/08/21 0510)   PRN Meds: acetaminophen, alteplase, anticoagulant sodium citrate, docusate sodium, heparin, HYDROmorphone (DILAUDID) injection, lidocaine (PF), lidocaine-prilocaine, mouth rinse, oxyCODONE, pentafluoroprop-tetrafluoroeth, polyethylene glycol   Vital Signs    Vitals:   11/08/21 0300 11/08/21 0400 11/08/21 0500 11/08/21 0600  BP: 117/75 114/72 117/71 105/63  Pulse: 69 70 76 70  Resp: (!) 21 (!) 21 (!) 24 20  Temp:  98.8 F (37.1 C)    TempSrc:  Oral    SpO2: 96% 96% 94% 96%  Weight:   62.1 kg     Intake/Output Summary (Last 24 hours) at 11/08/2021 0816 Last data filed at 11/08/2021 0600 Gross per 24 hour  Intake 1304.83 ml  Output 600 ml  Net 704.83 ml   Filed Weights   11/07/21 1015 11/07/21 1352 11/08/21 0500  Weight: 61.6 kg 61 kg 62.1 kg    Telemetry    SR with redevelopment of Afib in the 80s bpm - Personally Reviewed  ECG    No new tracings - Personally Reviewed  Physical Exam   GEN: No acute distress. Soft spoken.  Neck: No JVD. Cardiac: RRR transitioning to IRIR, no murmurs, rubs, or gallops.   Respiratory: Diminished breath sounds bilaterally.  GI: Soft, nontender, non-distended.   MS: No edema; No deformity. Neuro:  Alert and oriented x 3; Nonfocal.  Psych: Normal affect.  Labs    Chemistry Recent Labs  Lab 11/05/21 0320 11/06/21 0436 11/06/21 1526 11/07/21 0512 11/08/21 0216  NA 141 138  --  135 133*  K 4.7 4.6 4.7 5.2* 4.0  CL 107 100  --  99 96*  CO2 19* 26  --  24 28  GLUCOSE 185* 105*  --  97 116*  BUN 65* 49*  --  63* 34*  CREATININE 10.38* 8.25*  --  10.10* 5.91*  CALCIUM 7.8* 8.3*  --  8.8* 9.3  PROT 5.1*  --   --   --   --   ALBUMIN 2.7*  --   --  2.8*  --   AST 19  --   --   --   --   ALT 11  --   --   --   --   ALKPHOS 58  --   --   --   --   BILITOT 0.5  --   --   --   --   GFRNONAA 4* 5*  --  4* 7*  ANIONGAP 15 12  --  12 9     Hematology Recent Labs  Lab 11/06/21 0436 11/06/21 0552 11/07/21 0512 11/07/21 0612 11/07/21 1215 11/07/21 1901 11/08/21  0216  WBC 13.7*  --  14.4*  --   --   --  12.6*  RBC 2.45*  --  2.45*  --   --   --  2.80*  HGB 6.9*   < > 6.9*   < > 7.1* 6.9* 8.0*  HCT 20.8*   < > 20.3*   < > 20.3* 20.0* 23.0*  MCV 84.9  --  82.9  --   --   --  82.1  MCH 28.2  --  28.2  --   --   --  28.6  MCHC 33.2  --  34.0  --   --   --  34.8  RDW 17.1*  --  15.9*  --   --   --  15.7*  PLT 87*  --  81*  --   --   --  89*   < > = values in this interval not displayed.    Cardiac EnzymesNo results for input(s): "TROPONINI" in the last 168 hours. No results for input(s): "TROPIPOC" in the last 168 hours.   BNP Recent Labs  Lab 11/05/21 0320  BNP 128.8*     DDimer  Recent Labs  Lab 11/06/21 0950  DDIMER 16.58*     Radiology    US Carotid Bilateral  Result Date: 11/06/2021 IMPRESSION: 1. Right carotid artery system: Less than 50% stenosis secondary to mild multifocal atherosclerotic plaque formation. 2. Left carotid artery system: Less than 50% stenosis secondary to mild multifocal atherosclerotic plaque formation. 3.   Vertebral artery system: Patent with antegrade flow bilaterally. Ruthann Cancer, MD [facility] Electronically Signed   By: Ruthann Cancer M.D.   On: 11/06/2021 14:27   CT ABDOMEN PELVIS WO CONTRAST  IMPRESSION: Evolving right-sided retroperitoneal hematoma, with overall similar volume of hemoperitoneum in comparison to recent CT. Interval right renal vascular coil embolization. Small bilateral pleural effusions with adjacent atelectasis, slightly increased from prior exam. 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 Electronically Signed   By: Maurine Simmering M.D.   On: 11/06/2021 13:47   DG Abd 1 View  Result Date: 11/06/2021 IMPRESSION: 1. Nonobstructive bowel gas pattern. 2. Left femoral catheter tip projects over the lower abdomen just below the level of the aortic bifurcation. Electronically Signed   By: Kerby Moors M.D.   On: 11/06/2021 10:44    Cardiac Studies   2D echo 11/06/2021: 1. Left ventricular ejection fraction, by estimation, is 50 to 55%. The  left ventricle has low normal function. The left ventricle has no regional  wall motion abnormalities. There is mild concentric left ventricular  hypertrophy. Left ventricular  diastolic parameters are consistent with Grade I diastolic dysfunction  (impaired relaxation).   2. Right ventricular systolic function is normal. The right ventricular  size is normal.   3. The mitral valve is normal in structure. Trivial mitral valve  regurgitation.   4. The aortic valve is calcified. Aortic valve regurgitation is not  visualized. Aortic valve sclerosis/calcification is present, without any  evidence of aortic stenosis.   Patient Profile     66 y.o. female with history of ESRD on hemodialysis, HFpEF, PAF on warfarin, DVT, cerebral meningioma, depression, and breast cancer who is being evaluated for A-fib with RVR and NSVT.  Assessment & Plan    1.  A-fib  with RVR: -Initially maintaining sinus rhythm on IV amiodarone gtt, though redeveloped Afib during rounds, increase IV amiodarone to full dose for now  to reload -Transition to oral amiodarone prior to discharge  -Not currently on anticoagulation due to significant anemia and RPH  2.  NSVT: -Asymptomatic -No further runs -Tele  3.  HFpEF: -Volume management per hemodialysis, may benefit from an extra session  4.  Hemorrhagic shock secondary to right renal hemorrhage in our pH with hypovolemic syncope and acute blood loss anemia in the setting of supra therapeutic INR: -Hemodynamically stable -Per primary service   5.  ESRD on HD: -Per nephrology    For questions or updates, please contact [place] Please consult [email or web address] for contact info under Cardiology/STEMI.    Signed, Christell Faith, PA-C [facility] Pager: [phone] 11/08/2021, 8:16 AM

## 2021-11-08 NOTE — Consult Note (Signed)
Palliative Care Consult Note                                  Date: 11/08/2021   Patient Name: Teresa Cook  DOB: 11-05-1955  MRN: [id number]  Age / Sex: 66 y.o., female  PCP: [facility] [place] Referring Physician: Flora Lipps, MD  Reason for Consultation: Establishing goals of care  HPI/Patient Profile: 66 y.o. female  with past medical history of ESRD on HD, A. Fib & DVT on Warfarin , admitted with Hemorrhagic shock due to right renal hemorrhage with large hematoma in setting of supratherapeutic INR (INR >9).  Vascular Surgery consulted, required coil embolization of the right renal artery.  She was admitted  hemorrhagic shock in the setting of right renal hemorrhage,on 11/05/2021 with hypotension, acute blood loss anemia, ESRD on hemodialysis, and others.   PMT was consulted for [place] conversations.  Past Medical History:  Diagnosis Date   Anemia    Anxiety    Breast cancer ([place]) 01/2016   bilateral   CHF (congestive heart failure) (HCC)    Chronic kidney disease    Depression    Dialysis patient [facility])    DVT (deep venous thrombosis) ([place])    left leg   DVT (deep venous thrombosis) ([place]) 1985   right thigh   Dysrhythmia    Gout    Headache    HTN (hypertension)    Hypertension    Parathyroid abnormality (HCC)    Parathyroid disease ([place])    Pneumonia 12/2015   Psoriasis    Renal insufficiency    Sickle cell trait (HCC)    traits    Subjective:   This NP Walden Field reviewed medical records, received report from team, assessed the patient and then meet at the patient's bedside to discuss diagnosis, prognosis, GOC, EOL wishes disposition and options.  I met with the patient at the bedside.  No family was present.   Concept of Palliative Care was introduced as specialized medical care for people and their families living with serious illness.  If focuses on providing relief from the symptoms and  stress of a serious illness.  The goal is to improve quality of life for both the patient and the family. Values and goals of care important to patient and family were attempted to be elicited.  Created space and opportunity for patient  and family to explore thoughts and feelings regarding current medical situation   Natural trajectory and current clinical status were discussed. Questions and concerns addressed. Patient  encouraged to call with questions or concerns.    Patient/Family Understanding of Illness: The patient understands that she fell (thinks her blood pressure bottomed out) and had  to scoot to the phone in the bedroom.  Her granddaughter came and helped her up and got her to the bathroom.  After her granddaughter left she fell again and hit her head on the floor but does not think she lost consciousness.  She again had to scoot across the floor to appointment at which point she called 911.  She states that she "split her kidney" and they did a procedure to stop the bleeding.  We had further discussion about her acute presentation including hemorrhagic shock, bleeding requiring coil embolization by IR, supratherapeutic INR likely resulting in the bleed.  Life Review: She is a widow and has 2 sons and a daughter as well as 1 Psychiatrist.  One of her sons Redmond Pulling) lives in [place].  The other son, daughter, granddaughter are local.  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  She very much enjoyed this work.  Patient Values: Family, living life, quality of life  Goals: To get better, discharged home  Today's Discussion: Addition to discussions described above rehab extensive discussion on various topics.  We discussed her family, her current health situation, and the difficult recovery she has been having.  She continues to have pain, although the pain medication seems to help.  We discussed residual hematoma  with him to take some time to breakdown.  She also notes her heart has been in a funny rhythm.  When her breathing gets difficult BiPAP seems to help.  We discussed given the amount of blood she had to proceed possibility of fluid overload for which dialysis as outpatient.  Overall today she feels "meh".  Our discussion focused on goals of care included discussion of CODE STATUS.  She states that she would not want a feeding tube.  She states that she has discussed this with her daughter and her daughter is aware of her wishes.  Patient is not feeling well (had a recent episode of hypoglycemia) we decided to defer further discussion on CODE STATUS for tomorrow.  I provided emotional and general support through therapeutic listening, empathy, sharing of stories, and other techniques. I answered all questions and addressed all concerns to the best of my ability.  Review of Systems  Constitutional:  Positive for fatigue.       Not feeling well overall  Respiratory:  Positive for shortness of breath (Intermittent, improved).   Cardiovascular:  Negative for chest pain.  Gastrointestinal:  Negative for abdominal pain, nausea and vomiting.    Objective:   Primary Diagnoses: Present on Admission:  Hypotension   Physical Exam Vitals and nursing note reviewed.  Constitutional:      General: She is not in acute distress.    Appearance: She is ill-appearing.  HENT:     Head: Normocephalic and atraumatic.  Cardiovascular:     Rate and Rhythm: Tachycardia present. Rhythm irregular.  Pulmonary:     Effort: Pulmonary effort is normal. No respiratory distress.  Abdominal:     General: Abdomen is flat.     Palpations: Abdomen is soft.  Skin:    General: Skin is warm and dry.  Neurological:     General: No focal deficit present.     Mental Status: She is alert.  Psychiatric:        Mood and Affect: Mood normal.        Behavior: Behavior normal.     Vital Signs:  BP 137/78 (BP Location:  Right Arm)   Pulse 89   Temp 99.1 F (37.3 C) (Oral)   Resp (!) 27   Wt 62.1 kg   SpO2 93%   BMI 22.78 kg/m   Palliative Assessment/Data: 30%    Advanced Care Planning:   Existing Vynca/ACP Documentation: HCPOA dated 09/11/2016  Primary Decision Maker: PATIENT  Code Status/Advance Care Planning: Full code  A discussion was had today regarding advanced directives. Concepts specific to code status, artifical feeding and hydration, continued IV antibiotics and rehospitalization was had.  The difference between a aggressive medical intervention path and a palliative comfort care path for this patient at this time was had.   Decisions/Changes to ACP: None today  Assessment & Plan:  Impression: 66 year old female with chronic comorbidities and acute presentations as described above.  The bleeding has seemed to stop and her INR is corrected.  Hemorrhagic shock has resolved.  She intermittently goes in and out of A-fib and becomes short of breath with associated hypotension.  This is most frequently occurred with dialysis.  She has not had this problem before.  I suspect that she is either able to tolerate her significant anemia or hemodialysis, but not both at the same time.  She is on amiodarone drip to help with conversion back to normal sinus rhythm.  Episode of hypoglycemia today, still feeling a bit poorly following this.  We began goals of care discussions but have deferred finalizing this discussion until tomorrow.  Overall prognosis guarded to poor.  SUMMARY OF RECOMMENDATIONS   Continue full code for now Continue full scope of care for now NO FEEDING TUBE PMT will continue to follow  Symptom Management:  Per primary team PMT is available to assist as needed  Prognosis:  Unable to determine  Discharge Planning:  To Be Determined   Discussed with: Medical team, nursing team, patient    Thank you for allowing [place] to participate in the care of Theone Bowell PMT  will continue to support holistically.  Time Total: 95 min  Greater than 50%  of this time was spent counseling and coordinating care related to the above assessment and plan.  Signed by: Walden Field, NP Palliative Medicine Team  Team Phone # [phone] (Nights/Weekends)  11/08/2021, 1:10 PM

## 2021-11-08 NOTE — Progress Notes (Signed)
Daily Progress Note   Patient Name: Teresa Cook       Date: 11/08/2021 DOB: 05-02-1955  Age: 66 y.o. MRN#: [id number] Attending Physician: Flora Lipps, MD Primary Care Physician: [facility] [place] Date: 11/05/2021 Length of Stay: 3 days  Reason for Consultation/Follow-up: Establishing goals of care  HPI/Patient Profile:  66 y.o. female  with past medical history of ESRD on HD, A. Fib & DVT on Warfarin , admitted with Hemorrhagic shock due to right renal hemorrhage with large hematoma in setting of supratherapeutic INR (INR >9).  Vascular Surgery consulted, required coil embolization of the right renal artery.  She was admitted  hemorrhagic shock in the setting of right renal hemorrhage,on 11/05/2021 with hypotension, acute blood loss anemia, ESRD on hemodialysis, and others.    PMT was consulted for [place] conversations.  Subjective:   Subjective: Chart Reviewed. Updates received. Patient Assessed. Created space and opportunity for patient  and family to explore thoughts and feelings regarding current medical situation.  Today's Discussion: Today met with the patient at bedside.  Her daughter was not present.  She is feeling a bit better today.  Cardiology was in the room evaluating her for the day.  She had a litany of concerns with scars, abdominal intercom, etc.  We listened to these and I recommended she speak with unit leadership about her concerns.  We continued our discussion about CODE STATUS and other goals.  She states that she discussed these with her daughter yesterday.  They are both in agreement for her to have resuscitation as an initial attempt.  However, she would not want to live with artificial prolongation (ventilators, trach, etc.) including no feeding tube.  She states her daughter should be here later today and I informed her I would try to come back to speak with her daughter as well.  I provided emotional and general support through  therapeutic listening, empathy, sharing of stories, and other techniques. I answered all questions and addressed all concerns to the best of my ability.  **ADDENDUM: I came back to the unit later in the afternoon and patient's daughter was not there.**  Review of Systems  Constitutional:        Feels better overall today compared to yesterday  Respiratory:  Positive for shortness of breath (remains improved but persistent). Negative for chest tightness.   Gastrointestinal:  Negative for abdominal pain, nausea and vomiting.    Objective:   Vital Signs:  BP 137/78 (BP Location: Right Arm)   Pulse 89   Temp 99.1 F (37.3 C) (Oral)   Resp (!) 27   Wt 62.1 kg   SpO2 93%   BMI 22.78 kg/m   Physical Exam: Physical Exam Vitals and nursing note reviewed.  Constitutional:      General: She is not in acute distress.    Appearance: She is ill-appearing.  HENT:     Head: Normocephalic and atraumatic.  Cardiovascular:     Rate and Rhythm: Normal rate.     Comments: Alternates in and out of AFib and NSR; rate controlled Pulmonary:     Effort: Pulmonary effort is normal. No respiratory distress.  Abdominal:     General: Abdomen is flat.     Palpations: Abdomen is soft.     Tenderness: There is abdominal tenderness (RUQ).  Skin:    General: Skin is warm and dry.  Neurological:     General: No focal deficit present.  Psychiatric:  Mood and Affect: Mood normal.        Behavior: Behavior normal.     Palliative Assessment/Data: 30%    Existing Vynca/ACP Documentation: HCPOA dated 09/11/2016  Assessment & Plan:   Impression: Present on Admission:  Hypotension  66 year old female with chronic comorbidities and acute presentations as described above.  The bleeding has seemed to stop and her INR is corrected.  Hemorrhagic shock has resolved.  She intermittently goes in and out of A-fib and becomes short of breath with associated hypotension.  This is most frequently  occurred with dialysis.  She has not had this problem before.  I suspect that she is either able to tolerate her significant anemia or hemodialysis, but not both at the same time.  She is on amiodarone drip to help with conversion back to normal sinus rhythm.  Episode of hypoglycemia today, still feeling a bit poorly following this.  We finished CODE STATUS discussion the patient would like to be a full code but would not want artificial prolongation indefinitely and would not want a feeding tube..  Overall prognosis guarded to poor.   SUMMARY OF RECOMMENDATIONS   Remain full code, full scope of care No feeding tube Goals are clear PMT will continue to follow peripherally Please notify us of any significant clinical changes or new palliative needs  Symptom Management:  Per primary team PMT is available to assist as needed  Code Status: Full code  Prognosis: Unable to determine  Discharge Planning: To Be Determined  Discussed with: Patient, medical team, nursing team  Thank you for allowing [place] to participate in the care of Teresa Cook PMT will continue to support holistically.  Time Total: 60 min  Visit consisted of counseling and education dealing with the complex and emotionally intense issues of symptom management and palliative care in the setting of serious and potentially life-threatening illness. Greater than 50%  of this time was spent counseling and coordinating care related to the above assessment and plan.  Walden Field, NP Palliative Medicine Team  Team Phone # [phone] (Nights/Weekends)  09/05/2020, 8:17 AM

## 2021-11-08 NOTE — Progress Notes (Signed)
PT did 2.5 hrs of HD.  UF = 1500 ml  MD at bedside talking to daughter   11/08/21 1730  Vitals  Temp 99.2 F (37.3 C)  Temp Source Oral  BP (!) 145/87  MAP (mmHg) 104  BP Location Right Arm  BP Method Automatic  Patient Position (if appropriate) Lying  Pulse Rate (!) 122  Pulse Rate Source Monitor  ECG Heart Rate (!) 110  Resp (!) 32  Oxygen Therapy  SpO2 92 %  O2 Device Nasal Cannula  Post Treatment  Dialyzer Clearance Lightly streaked  Duration of HD Treatment -hour(s) 2.5 hour(s)  Hemodialysis Intake (mL) 0 mL  Liters Processed 51.8  Fluid Removed 1500 mL  Tolerated HD Treatment Yes  Post-Hemodialysis Comments HD complete. MD at bedside  Hemodialysis Catheter Left Internal jugular Permanent  Placement Date: (c)   Placed prior to admission: Yes  Orientation: Left  Access Location: Internal jugular  Hemodialysis Catheter Type: Permanent  Site Condition No complications  Blue Lumen Status Flushed;Heparin locked;Dead end cap in place  Red Lumen Status Flushed;Dead end cap in place;Heparin locked  Purple Lumen Status N/A  Catheter fill solution Heparin 1000 units/ml  Catheter fill volume (Arterial) 1.7 cc  Catheter fill volume (Venous) 1.7  Dressing Type Transparent  Dressing Status Antimicrobial disc in place;Clean, Dry, Intact  Drainage Description None  Post treatment catheter status Capped and Clamped

## 2021-11-08 NOTE — IPAL (Signed)
  Interdisciplinary Goals of Care Family Meeting   Date carried out: 11/08/2021  Location of the meeting: Bedside  Member's involved: Physician and Family Member or next of kin     GOALS OF CARE DISCUSSION  The Clinical status was relayed to family in detail- Daughter at bedside, Patient lethargic and poor insight  Updated and notified of patients medical condition-    Patient with increased WOB and using accessory muscles to breathe Explained to family course of therapy and the modalities   Patient with Progressive multiorgan failure with a very high probablity of a very minimal chance of meaningful recovery despite all aggressive and optimal medical therapy.   Patient with Severe bleed from kidney injury due to super-therapeutic INR, patient will NOT be able to start anticoagulation for A fib and is at very high risk for blood clots and stroke and cardiac arrest   Daughter  understands the situation.  Patient would NOT want intubation and ventilator and would NOT want CPR  PATIENT IS NOW DNR/DNI    Family are satisfied with Plan of action and management. All questions answered  Additional CC time 35 mins   Merleen Picazo Patricia Pesa, M.D.  [facility]  Medical Director [place] Director [facility] Cardio-Pulmonary Department

## 2021-11-08 NOTE — Progress Notes (Signed)
NAME:  Teresa Cook, MRN:  [id number], DOB:  Jul 21, 1955, LOS: 3 ADMISSION DATE:  11/05/2021, CONSULTATION DATE:  11/05/2021 REFERRING MD:  Dr. Karma Greaser, CHIEF COMPLAINT:  Back/abdominal pain, loss of consciousness   Brief Pt Description / Synopsis:  66 y.o female with PMH significant for ESRD on HD, A. Fib & DVT on Warfarin , admitted with Hemorrhagic shock due to right renal hemorrhage with large hematoma in setting of supratherapeutic INR (INR >9).  Vascular Surgery consulted, required coil embolization of the right renal artery.  History of Present Illness:  Teresa Cook is a 66 year old female with a past medical history significant for ESRD on hemodialysis with chronic PermCath, CHF, DVT, cerebral meningioma who presents to [facility] ED on 11/05/2021 status post fall and loss of consciousness.  Patient reports she lives alone and passed out at least 4 times overnight, and when going to the bathroom around 5 AM this morning,  she passed out again it and thinks she hit her head.  Following the fall she also reports development of back/flank pain that radiates around to the right lower quadrant.  She reports she has been compliant with her hemodialysis, last session on Friday.  She denies chest pain, shortness of breath.  She reports compliance with her warfarin for anticoagulation.  ED Course: Initial Vital Signs: Temperature 97.6 F rectally, blood pressure 125/81, pulse 74, respiratory rate 25 Significant Labs: Bicarb 19, glucose 185, BUN 65, creatinine 10.38, albumin 2.7, BNP 128, high-sensitivity troponin 11, lactic acid 3.8, WBC 12.9 with neutrophilia, hemoglobin 8.0, hematocrit 25.1, prothrombin time 74.3, INR 9.2, APTT 36, TSH 3.3, free T4 0.84 COVID-19 PCR negative Imaging Chest X-ray>>IMPRESSION: 1. No active disease. CT head without contrast>>IMPRESSION: 1. Broad-based right anterior convexity scalp hematoma without underlying skull fracture. 2.  No acute traumatic injury  identified to the brain 3. But slowly enlarging chronic midline posterior fossa Meningioma, now up to 28 mm long axis versus 17 mm in 2017. No associated edema or significant mass effect. Recommend follow-up with Neurosurgery. CT cervical spine>>IMPRESSION: 1. No acute traumatic injury identified in the cervical spine. Mild for age cervical spine degeneration. 2. Aortic Atherosclerosis (ICD10-I70.0). Left-side dialysis catheter. CTA chest/abdomen/pelvis>>IMPRESSION: 1. Positive for Severe Acute Right Renal hemorrhage. Underlying chronic renal disease of dialysis with Active Renal Vessel Contrast Extravasation visible on delayed series 13, and large volume retroperitoneal/intraperitoneal hemorrhage of > 600 mL. This Critical Value/emergent result called by telephone at the time of interpretation on 11/05/2021 at 6:49 am to Dr. Hinda Kehr , who verbally acknowledged these results. 2. No aortic dissection (but see #3) or other significant acute finding; small right pleural and pericardial effusions appear to be transudate and are of doubtful significance. 3. 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 Medications Administered: Cefepime and vancomycin.  Vitamin K, Kcentra, Levophed  PCCM asked to admit the patient for further work-up and treatment.  Vascular surgery has been consulted.  Please see "significant hospital events" section below for full detailed hospital course. Pertinent  Medical History   Past Medical History:  Diagnosis Date   Anemia    Anxiety    Breast cancer ([place]) 01/2016   bilateral   CHF (congestive heart failure) (HCC)    Chronic kidney disease    Depression    Dialysis patient [facility])    DVT (deep venous thrombosis) ([place])    left leg   DVT (deep venous thrombosis) ([place]) 1985   right thigh  Dysrhythmia    Gout    Headache    HTN (hypertension)     Hypertension    Parathyroid abnormality (HCC)    Parathyroid disease ([place])    Pneumonia 12/2015   Psoriasis    Renal insufficiency    Sickle cell trait ([place])    traits     Micro Data:  10/30: SARS-CoV-2 PCR>>negative 10/30: Urine>> 10/30: Blood culture x2>>negative  10/30: MRSA PCR>>negative  Antimicrobials:  Cefepime 10/30 x1 dose Flagyl 10/30 x1 dose Vancomycin 10/30 x1 dose  Significant Hospital Events: Including procedures, antibiotic start and stop dates in addition to other pertinent events   10/30: Presented to ED, found to have hemorrhagic shock due to right renal hemorrhage.  Coumadin reversed with Kcentra and Vitamin K.  2 units of FFP and 2 unit of pRBCs administered.  Vascular Surgery performed embolization of right renal artery 10/31: Weaned off pressors, lactic normal. New thrombocytopenia, DIC workup negative.  Continues to complain of significant right sided abdominal/flank pain, will repeat CT Abdomen & Pelvis.  Transfuse 1 unit pRBCs for Hgb 6.5 10/31: [place] Bilateral Carotid revealed right carotid artery        system: Less than 50% stenosis secondary to mild multifocal        atherosclerotic plaque formation. Left carotid artery system:        Less than 50% stenosis secondary to mild multifocal        atherosclerotic plaque formation. Vertebral artery system:        Patent with antegrade flow bilaterally 11/1: Pt remains on amiodarone gtt and precedex gtt.  States pain is better controlled.  In sinus rhythm rate controlled.  During HD session pt developed atrial fibrillation with rvr and hypotension requiring neo-synephrine gtt.  Required 1 unit pRBC due to hgb 6.9 post transfusion hgb 8.0 no signs of bleeding  11/2: Pt remains on amiodarone gtt, neo-synephrine gtt '@20'$  mcg/min, and precedex gtt '@0'$ .7 mcg/kg/hr.    Interim History / Subjective:  As outlined above   Objective   Blood pressure 105/63, pulse 70, temperature 98.8 F (37.1 C), temperature source  Oral, resp. rate 20, weight 62.1 kg, SpO2 96 %.        Intake/Output Summary (Last 24 hours) at 11/08/2021 0277 Last data filed at 11/08/2021 0600 Gross per 24 hour  Intake 1358.67 ml  Output 600 ml  Net 758.67 ml   Filed Weights   11/07/21 1015 11/07/21 1352 11/08/21 0500  Weight: 61.6 kg 61 kg 62.1 kg    Examination: General: Acute on chronically-ill appearing female, NAD resting in bed  HENT: Normocephalic, neck supple, no JVD Lungs: Clear breath sounds throughout, even, non labored  Cardiovascular: NSR, rrr, no r/g, 2+ radial/2+ distal pulses, no edema  Abdomen: +BS x4, obese, severe RUQ/RLQ tenderness  Extremities: Normal bulk and tone, no deformities, no edema Neuro: Alert and oriented, following commands, PERRLA GU: Deferred, patient reports she is anuric  Resolved Hospital Problem list     Assessment & Plan:   Hemorrhagic shock in the setting of right renal hemorrhage~ RESOLVED Afibb with RVR~resolved  Intermittent runs of Vtach~resolved  Hypotension: Cardiogenic and hemorrhagic shock  PMHx: HFpEF, DVT, A. Fib on Warfarin, HTN Echocardiogram 11/06/21: EF 55 to 41%; grade I diastolic dysfunction  -CT Head on presentation negative for acute intracranial abnormality - Continuous cardiac monitoring - Cautious IV fluids - Blood transfusions as indicated - Vasopressors as needed to maintain MAP >65 - HS Troponin negative x3 - Cardiology consulted appreciate input:  currently on amiodarone gtt  - Vascular surgery is following, appreciate input~s/p embolization of the right renal artery 10/30  Acute blood loss anemia Supratherapeutic INR s/p coumadin reversal~IMPROVED Thrombocytopenia PMHx: Pt on outpatient coumadin for A.fib & DVT - Monitor for S/Sx of bleeding - Trend CBC (H&H q6h) - SCD's for VTE Prophylaxis  - Transfuse for Hgb <7  - Continue to follow coags and INR - DIC workup negative (normal fibrinogen, no schistocytes)  ESRD on Hemodialysis (M,W,F) Mild  hyperkalemia  - Trend BMP - Replace electrolytes as indicated  - Nephrology consulted appreciate input: HD per recommendations   Mild Leukocytosis - Trend wbc and monitor fever curve  - Follow cultures as above - Pt febrile overnight zosyn started  Hyperglycemia   Hgb A1c ~ 5.6 - CBG's q4h; Target range of 140 to 180 - SSI - Follow ICU Hypo/Hyperglycemia protocol  Abdominal Pain  - Prn dilaudid for pain management   Anxiety  - Continue low dose precedex gtt   Best Practice (right click and "Reselect all SmartList Selections" daily)  Diet/type: Full Liquid Diet will advance as tolerated  DVT prophylaxis: SCD GI prophylaxis: N/A Lines: Central line and yes and it is still needed Foley:  N/A Code Status:  full code Last date of multidisciplinary goals of care discussion [11/08/21]  11/2: Updated pt regarding plan of care and all questions answered  Labs   CBC: Recent Labs  Lab 11/05/21 0320 11/05/21 1003 11/06/21 0435 11/06/21 0436 11/06/21 0552 11/07/21 0512 11/07/21 0612 11/07/21 1215 11/07/21 1901 11/08/21 0216  WBC 12.9*  --  13.4* 13.7*  --  14.4*  --   --   --  12.6*  NEUTROABS 9.5*  --  10.6*  --   --  11.8*  --   --   --  10.4*  HGB 8.0*   < > 7.3* 6.9*   < > 6.9* 7.0* 7.1* 6.9* 8.0*  HCT 25.1*   < > 21.1* 20.8*   < > 20.3* 20.2* 20.3* 20.0* 23.0*  MCV 87.5  --  80.8 84.9  --  82.9  --   --   --  82.1  PLT 174  --  73* 87*  --  81*  --   --   --  89*   < > = values in this interval not displayed.    Basic Metabolic Panel: Recent Labs  Lab 11/05/21 0320 11/06/21 0436 11/06/21 1526 11/07/21 0512 11/08/21 0216  NA 141 138  --  135 133*  K 4.7 4.6 4.7 5.2* 4.0  CL 107 100  --  99 96*  CO2 19* 26  --  24 28  GLUCOSE 185* 105*  --  97 116*  BUN 65* 49*  --  63* 34*  CREATININE 10.38* 8.25*  --  10.10* 5.91*  CALCIUM 7.8* 8.3*  --  8.8* 9.3  MG  --  1.8 1.7 1.9 1.7  PHOS  --  7.1*  --  8.6* 5.0*   GFR: Estimated Creatinine Clearance: 8.3 mL/min  (A) (by C-G formula based on SCr of 5.91 mg/dL (H)). Recent Labs  Lab 11/05/21 1003 11/05/21 1322 11/05/21 2050 11/06/21 0435 11/06/21 0436 11/06/21 0821 11/07/21 0512 11/08/21 0216  PROCALCITON  --   --   --   --  2.08  --   --   --   WBC  --   --   --  13.4* 13.7*  --  14.4* 12.6*  LATICACIDVEN 4.6* 1.5 1.2  --   --  1.2  --   --     Liver Function Tests: Recent Labs  Lab 11/05/21 0320 11/07/21 0512  AST 19  --   ALT 11  --   ALKPHOS 58  --   BILITOT 0.5  --   PROT 5.1*  --   ALBUMIN 2.7* 2.8*   No results for input(s): "LIPASE", "AMYLASE" in the last 168 hours. No results for input(s): "AMMONIA" in the last 168 hours.  ABG    Component Value Date/Time   PHART 7.35 07/27/2021 1218   PCO2ART 41 07/27/2021 1218   PO2ART 274 (H) 07/27/2021 1218   HCO3 22.6 07/27/2021 1218   ACIDBASEDEF 2.9 (H) 07/27/2021 1218   O2SAT 99.9 07/27/2021 1218     Coagulation Profile: Recent Labs  Lab 11/05/21 0450 11/05/21 1003 11/06/21 0436 11/07/21 0512  INR 9.2* 1.4* 1.4* 1.5*    Cardiac Enzymes: No results for input(s): "CKTOTAL", "CKMB", "CKMBINDEX", "TROPONINI" in the last 168 hours.  HbA1C: Hemoglobin A1C  Date/Time Value Ref Range Status  12/29/2013 05:04 AM 5.0 4.2 - 6.3 % Final    Comment:    The American Diabetes Association recommends that a primary goal of therapy should be <7% and that physicians should reevaluate the treatment regimen in patients with HbA1c values consistently >8%.    Hgb A1c MFr Bld  Date/Time Value Ref Range Status  11/06/2021 04:36 AM 5.6 4.8 - 5.6 % Final    Comment:    (NOTE) Pre diabetes:          5.7%-6.4%  Diabetes:              >6.4%  Glycemic control for   <7.0% adults with diabetes   07/08/2021 05:05 PM 4.9 4.8 - 5.6 % Final    Comment:    (NOTE)         Prediabetes: 5.7 - 6.4         Diabetes: >6.4         Glycemic control for adults with diabetes: <7.0     CBG: Recent Labs  Lab 11/07/21 1605 11/07/21 1633  11/07/21 1954 11/07/21 2352 11/08/21 0342  GLUCAP 63* 120* 114* 118* 110*    Review of Systems:   Positives in BOLD: Gen: Denies fever, chills, weight change, fatigue, night sweats HEENT: Denies blurred vision, double vision, hearing loss, tinnitus, sinus congestion, rhinorrhea, sore throat, neck stiffness, dysphagia PULM: Denies shortness of breath, cough, sputum production, hemoptysis, wheezing CV: Denies chest pain, edema, orthopnea, paroxysmal nocturnal dyspnea, palpitations GI: Denies abdominal/flank pain, nausea, vomiting, diarrhea, hematochezia, melena, constipation, change in bowel habits GU: Denies dysuria, hematuria, polyuria, oliguria, urethral discharge Endocrine: Denies hot or cold intolerance, polyuria, polyphagia or appetite change Derm: Denies rash, dry skin, scaling or peeling skin change Heme: Denies easy bruising, bleeding, bleeding gums Neuro: Denies headache, numbness, weakness, slurred speech, loss of memory or consciousness  Past Medical History:  She,  has a past medical history of Anemia, Anxiety, Breast cancer ([place]) (01/2016), CHF (congestive heart failure) ([place]), Chronic kidney disease, Depression, Dialysis patient ([place]), DVT (deep venous thrombosis) ([place]), DVT (deep venous thrombosis) ([place]) (1985), Dysrhythmia, Gout, Headache, HTN (hypertension), Hypertension, Parathyroid abnormality ([place]), Parathyroid disease ([place]), Pneumonia (12/2015), Psoriasis, Renal insufficiency, and Sickle cell trait ([place]).   Surgical History:   Past Surgical History:  Procedure Laterality Date   ABDOMINAL HYSTERECTOMY  1980   APPENDECTOMY     BREAST BIOPSY Left 10/28/2013   benign   BREAST EXCISIONAL BIOPSY Left 2002   benign  CORONARY ANGIOGRAPHY N/A 04/04/2020   Procedure: CORONARY ANGIOGRAPHY;  Surgeon: Wellington Hampshire, MD;  Location: [place] [facility];  Service: Cardiovascular;  Laterality: N/A;   INSERTION OF DIALYSIS CATHETER  2014   LIPOMA EXCISION N/A 01/23/2016    Procedure: EXCISION LIPOMA;  Surgeon: Hubbard Robinson, MD;  Location: [facility];  Service: General;  Laterality: N/A;   MASTECTOMY W/ SENTINEL NODE BIOPSY Bilateral 01/23/2016   Procedure: bilateral MASTECTOMY WITH  bilateral SENTINEL LYMPH NODE BIOPSY possible left axillary node dissection forehead lipoma removal;  Surgeon: Hubbard Robinson, MD;  Location: [facility];  Service: General;  Laterality: Bilateral;   PARTIAL HYSTERECTOMY     PERIPHERAL VASCULAR CATHETERIZATION N/A 12/25/2015   Procedure: Dialysis/Perma Catheter Insertion;  Surgeon: Algernon Huxley, MD;  Location: [place] [facility];  Service: Cardiovascular;  Laterality: N/A;   PERIPHERAL VASCULAR CATHETERIZATION Left 01/22/2016   Procedure: Dialysis/Perma Catheter Insertion;  Surgeon: Algernon Huxley, MD;  Location: [place] [facility];  Service: Cardiovascular;  Laterality: Left;   PERIPHERAL VASCULAR CATHETERIZATION N/A 01/26/2016   Procedure: Dialysis/Perma Catheter Insertion;  Surgeon: Katha Cabal, MD;  Location: [place] [facility];  Service: Cardiovascular;  Laterality: N/A;   PORT-A-CATH REMOVAL N/A 12/20/2015   Procedure: REMOVAL PORT-A-CATH;  Surgeon: Hubbard Robinson, MD;  Location: [facility];  Service: General;  Laterality: N/A;  left     PORTACATH PLACEMENT Left 08/21/2015   Procedure: INSERTION PORT-A-CATH;  Surgeon: Hubbard Robinson, MD;  Location: [facility];  Service: General;  Laterality: Left;   REMOVAL OF A DIALYSIS CATHETER  2017   RENAL ANGIOGRAPHY Right 11/05/2021   Procedure: RENAL ANGIOGRAPHY;  Surgeon: Algernon Huxley, MD;  Location: [place] [facility];  Service: Cardiovascular;  Laterality: Right;  with embolization   RIGHT HEART CATH N/A 04/04/2020   Procedure: RIGHT HEART CATH;  Surgeon: Wellington Hampshire, MD;  Location: [place] [facility];  Service: Cardiovascular;  Laterality: N/A;     Social History:   reports that she has never smoked. She has never used smokeless tobacco. She reports that she  does not drink alcohol and does not use drugs.   Family History:  Her family history includes Breast cancer (age of onset: 19) in her maternal aunt; CVA in her mother; Cancer (age of onset: 45) in her sister; Diabetes in her sister; Hypertension in her brother, father, mother, and sister; Stroke in her brother and mother.   Allergies Allergies  Allergen Reactions   Gabapentin Other (See Comments)    Seizure   Adhesive [Tape] Itching    Silk tape is ok to use.     Home Medications  Prior to Admission medications   Medication Sig Start Date End Date Taking? Authorizing Provider  albuterol (VENTOLIN HFA) 108 (90 Base) MCG/ACT inhaler Inhale 2 puffs into the lungs every 4 (four) hours as needed for wheezing or shortness of breath. Patient not taking: Reported on 07/27/2021 10/26/20   Sharen Hones, MD  aspirin-acetaminophen-caffeine [facility] MIGRAINE) [phone] MG tablet Take 1-2 tablets by mouth every 6 (six) hours as needed for headache.    [provider]  atorvastatin (LIPITOR) 40 MG tablet Take 1 tablet (40 mg total) by mouth daily. 07/31/21   Loletha Grayer, MD  b complex-vitamin c-folic acid (NEPHRO-VITE) 0.8 MG TABS tablet Take 1 tablet by mouth daily.    [provider]  calcium carbonate (TUMS - DOSED IN MG ELEMENTAL CALCIUM) 500 MG chewable tablet Chew 1 tablet (200 mg  of elemental calcium total) by mouth 2 (two) times daily as needed for indigestion or heartburn. 07/30/21   Loletha Grayer, MD  carvedilol (COREG) 12.5 MG tablet Take 1 tablet (12.5 mg total) by mouth 2 (two) times daily. 07/30/21 08/29/21  Loletha Grayer, MD  cinacalcet (SENSIPAR) 60 MG tablet Take 60 mg by mouth daily before lunch.    [provider]  hydrALAZINE (APRESOLINE) 25 MG tablet Take 1 tablet (25 mg total) by mouth 3 (three) times daily. 07/30/21   Loletha Grayer, MD  isosorbide mononitrate (IMDUR) 30 MG 24 hr tablet Take 1 tablet (30 mg total) by mouth daily. 07/31/21    Loletha Grayer, MD  megestrol (MEGACE) 400 MG/10ML suspension Take 5 mLs (200 mg total) by mouth daily. 07/30/21   Loletha Grayer, MD  pantoprazole (PROTONIX) 40 MG tablet Take 1 tablet (40 mg total) by mouth 2 (two) times daily. 07/30/21   Loletha Grayer, MD  PARoxetine (PAXIL) 40 MG tablet Take 1 tablet (40 mg total) by mouth daily. 07/30/21 08/29/21  Loletha Grayer, MD  sacubitril-valsartan (ENTRESTO) 24-26 MG Take 1 tablet by mouth 2 (two) times daily. 02/15/21   Kate Sable, MD  sevelamer carbonate (RENVELA) 800 MG tablet Take 3 tablets (2,400 mg total) by mouth with breakfast, with lunch, and with evening meal. One with snacks 07/30/21   Loletha Grayer, MD  traZODone (DESYREL) 50 MG tablet Take 50 mg by mouth at bedtime. 03/01/21   [provider]  warfarin (COUMADIN) 5 MG tablet Take 1-1.5 tablets (5-7.5 mg total) by mouth See admin instructions. Take 1 tablet ('5mg'$ ) by mouth every Monday, Wednesday, Friday, Saturday and Sunday evening and take 1.5  tablets (7.'5mg'$ ) by mouth every Tuesday and Thursday evening 10/26/20   Sharen Hones, MD  zolpidem (AMBIEN) 10 MG tablet Take 0.5 tablets (5 mg total) by mouth at bedtime. 07/30/21   Loletha Grayer, MD     Critical care time: 35 minutes     Donell Beers, [place] Pager [phone] (please enter 7 digits) PCCM Consult Pager [phone] (please enter 7 digits)

## 2021-11-08 NOTE — Consult Note (Signed)
PHARMACY CONSULT NOTE  Pharmacy Consult for Electrolyte Monitoring and Replacement   Recent Labs: Potassium (mmol/L)  Date Value  11/08/2021 4.0  02/03/2014 4.0   Magnesium (mg/dL)  Date Value  11/08/2021 1.7   Calcium (mg/dL)  Date Value  11/08/2021 9.3   Calcium, Total (mg/dL)  Date Value  02/03/2014 8.6   Albumin (g/dL)  Date Value  11/07/2021 2.8 (L)  02/02/2014 3.5   Phosphorus (mg/dL)  Date Value  11/08/2021 5.0 (H)  01/17/2014 4.9   Sodium (mmol/L)  Date Value  11/08/2021 133 (L)  02/29/2020 140  02/03/2014 138   Corrected Calcium: 9.3 mg/dL  Assessment: Patient is a 66 y/o F with medical history including depression / anxiety, DVT, 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 Patient subsequently found to have severe acute right renal hemorrhage in setting of supratherapeutic INR on warfarin. Patient ordered emergent reversal with Kcentra and vitamin K. Pharmacy consulted to assist with electrolyte monitoring and replacement as indicated.  Hyperphosphatemia d/t renal osteodystrophy- managed outpatient.   Goal of Therapy:  Within normal limits  Plan:  --Mag 1.7. Magnesium sulfate IV 2 grams x 1 ordered by medical team --Follow-up electrolytes with AM labs tomorrow  Wynelle Cleveland 11/08/2021 8:52 AM

## 2021-11-08 NOTE — Discharge Planning (Signed)
ESTABLISHED HEMODIALYSIS PATIENT Outpatient facility:  [facility]  [address] [place] [address] [phone]   Treatment days: Monday, Wednesday and Friday   Chair time: 07:00am  Schedule confirmed with Elta Guadeloupe RN.  Met with patient, stated she drives herself to treatments and has no dialysis concerns at this time.   Elvera Bicker Dialysis Coordinator [phone]

## 2021-11-09 ENCOUNTER — Inpatient Hospital Stay: Payer: Medicare Other

## 2021-11-09 DIAGNOSIS — I4891 Unspecified atrial fibrillation: Secondary | ICD-10-CM

## 2021-11-09 DIAGNOSIS — D62 Acute posthemorrhagic anemia: Secondary | ICD-10-CM | POA: Diagnosis not present

## 2021-11-09 DIAGNOSIS — R7989 Other specified abnormal findings of blood chemistry: Secondary | ICD-10-CM | POA: Diagnosis not present

## 2021-11-09 DIAGNOSIS — N186 End stage renal disease: Secondary | ICD-10-CM | POA: Diagnosis not present

## 2021-11-09 DIAGNOSIS — Z992 Dependence on renal dialysis: Secondary | ICD-10-CM | POA: Diagnosis not present

## 2021-11-09 LAB — TYPE AND SCREEN
ABO/RH(D): O POS
Antibody Screen: POSITIVE
Donor AG Type: NEGATIVE
Donor AG Type: NEGATIVE
Donor AG Type: NEGATIVE
Donor AG Type: NEGATIVE
Unit division: 0
Unit division: 0
Unit division: 0
Unit division: 0
Unit division: 0
Unit division: 0

## 2021-11-09 LAB — CBC WITH DIFFERENTIAL/PLATELET
Abs Immature Granulocytes: 0.08 10*3/uL — ABNORMAL HIGH (ref 0.00–0.07)
Abs Immature Granulocytes: 0.07 10*3/uL (ref 0.00–0.07)
Basophils Absolute: 0 10*3/uL (ref 0.0–0.1)
Basophils Absolute: 0 10*3/uL (ref 0.0–0.1)
Basophils Relative: 0 %
Basophils Relative: 0 %
Eosinophils Absolute: 0.1 10*3/uL (ref 0.0–0.5)
Eosinophils Absolute: 0.2 10*3/uL (ref 0.0–0.5)
Eosinophils Relative: 1 %
Eosinophils Relative: 2 %
HCT: 26.6 % — ABNORMAL LOW (ref 36.0–46.0)
HCT: 26.8 % — ABNORMAL LOW (ref 36.0–46.0)
Hemoglobin: 9.2 g/dL — ABNORMAL LOW (ref 12.0–15.0)
Hemoglobin: 9.2 g/dL — ABNORMAL LOW (ref 12.0–15.0)
Immature Granulocytes: 1 %
Immature Granulocytes: 1 %
Lymphocytes Relative: 7 %
Lymphocytes Relative: 6 %
Lymphs Abs: 0.8 10*3/uL (ref 0.7–4.0)
Lymphs Abs: 0.6 10*3/uL — ABNORMAL LOW (ref 0.7–4.0)
MCH: 28.1 pg (ref 26.0–34.0)
MCH: 28 pg (ref 26.0–34.0)
MCHC: 34.6 g/dL (ref 30.0–36.0)
MCHC: 34.3 g/dL (ref 30.0–36.0)
MCV: 81.3 fL (ref 80.0–100.0)
MCV: 81.5 fL (ref 80.0–100.0)
Monocytes Absolute: 1.2 10*3/uL — ABNORMAL HIGH (ref 0.1–1.0)
Monocytes Absolute: 1.2 10*3/uL — ABNORMAL HIGH (ref 0.1–1.0)
Monocytes Relative: 10 %
Monocytes Relative: 11 %
Neutro Abs: 9.2 10*3/uL — ABNORMAL HIGH (ref 1.7–7.7)
Neutro Abs: 8.2 10*3/uL — ABNORMAL HIGH (ref 1.7–7.7)
Neutrophils Relative %: 81 %
Neutrophils Relative %: 80 %
Platelets: 122 10*3/uL — ABNORMAL LOW (ref 150–400)
Platelets: 145 10*3/uL — ABNORMAL LOW (ref 150–400)
RBC: 3.27 MIL/uL — ABNORMAL LOW (ref 3.87–5.11)
RBC: 3.29 MIL/uL — ABNORMAL LOW (ref 3.87–5.11)
RDW: 15.8 % — ABNORMAL HIGH (ref 11.5–15.5)
RDW: 16 % — ABNORMAL HIGH (ref 11.5–15.5)
WBC: 11.3 10*3/uL — ABNORMAL HIGH (ref 4.0–10.5)
WBC: 10.3 10*3/uL (ref 4.0–10.5)
nRBC: 0 % (ref 0.0–0.2)
nRBC: 0 % (ref 0.0–0.2)

## 2021-11-09 LAB — BPAM RBC
Blood Product Expiration Date: 202311052359
Blood Product Expiration Date: 202311262359
Blood Product Expiration Date: 202312042359
Blood Product Expiration Date: 202312042359
Blood Product Expiration Date: 202312042359
Blood Product Expiration Date: 202312042359
ISSUE DATE / TIME: 202310301409
ISSUE DATE / TIME: 202310301818
ISSUE DATE / TIME: 202310311422
ISSUE DATE / TIME: 202311012218
Unit Type and Rh: 5100
Unit Type and Rh: 5100
Unit Type and Rh: 5100
Unit Type and Rh: 5100
Unit Type and Rh: 5100
Unit Type and Rh: 5100

## 2021-11-09 LAB — BASIC METABOLIC PANEL
Anion gap: 13 (ref 5–15)
BUN: 25 mg/dL — ABNORMAL HIGH (ref 8–23)
CO2: 29 mmol/L (ref 22–32)
Calcium: 9.5 mg/dL (ref 8.9–10.3)
Chloride: 93 mmol/L — ABNORMAL LOW (ref 98–111)
Creatinine, Ser: 4.88 mg/dL — ABNORMAL HIGH (ref 0.44–1.00)
GFR, Estimated: 9 mL/min — ABNORMAL LOW (ref >60)
Glucose, Bld: 106 mg/dL — ABNORMAL HIGH (ref 70–99)
Potassium: 4 mmol/L (ref 3.5–5.1)
Sodium: 135 mmol/L (ref 135–145)

## 2021-11-09 LAB — HEMOGLOBIN AND HEMATOCRIT, BLOOD
HCT: 24.6 % — ABNORMAL LOW (ref 36.0–46.0)
HCT: 26.8 % — ABNORMAL LOW (ref 36.0–46.0)
Hemoglobin: 8.5 g/dL — ABNORMAL LOW (ref 12.0–15.0)
Hemoglobin: 9.3 g/dL — ABNORMAL LOW (ref 12.0–15.0)

## 2021-11-09 LAB — GLUCOSE, CAPILLARY
Glucose-Capillary: 101 mg/dL — ABNORMAL HIGH (ref 70–99)
Glucose-Capillary: 100 mg/dL — ABNORMAL HIGH (ref 70–99)
Glucose-Capillary: 123 mg/dL — ABNORMAL HIGH (ref 70–99)
Glucose-Capillary: 118 mg/dL — ABNORMAL HIGH (ref 70–99)
Glucose-Capillary: 113 mg/dL — ABNORMAL HIGH (ref 70–99)
Glucose-Capillary: 117 mg/dL — ABNORMAL HIGH (ref 70–99)

## 2021-11-09 LAB — TROPONIN I (HIGH SENSITIVITY)
Troponin I (High Sensitivity): 185 ng/L (ref <18)
Troponin I (High Sensitivity): 172 ng/L (ref <18)

## 2021-11-09 LAB — HEPATITIS B SURFACE ANTIBODY, QUANTITATIVE: Hep B S AB Quant (Post): 6.2 m[IU]/mL — ABNORMAL LOW (ref >9.9)

## 2021-11-09 LAB — MAGNESIUM: Magnesium: 2.2 mg/dL (ref 1.7–2.4)

## 2021-11-09 LAB — PHOSPHORUS: Phosphorus: 5.4 mg/dL — ABNORMAL HIGH (ref 2.5–4.6)

## 2021-11-09 MED ORDER — METOPROLOL TARTRATE 25 MG PO TABS
12.5000 mg | ORAL_TABLET | Freq: Two times a day (BID) | ORAL | Status: DC
  Filled 2021-11-09: qty 1

## 2021-11-09 MED ORDER — ONDANSETRON HCL 4 MG/2ML IJ SOLN
4.0000 mg | Freq: Four times a day (QID) | INTRAMUSCULAR | Status: DC | PRN
  Administered 2021-11-09 – 2021-11-19 (×2): 4 mg via INTRAVENOUS
  Filled 2021-11-09 (×2): qty 2

## 2021-11-09 MED ORDER — METOPROLOL TARTRATE 5 MG/5ML IV SOLN
2.5000 mg | Freq: Four times a day (QID) | INTRAVENOUS | Status: DC
  Administered 2021-11-09 – 2021-11-13 (×15): 2.5 mg via INTRAVENOUS
  Filled 2021-11-09 (×15): qty 5

## 2021-11-09 MED ORDER — DIAZEPAM 5 MG/ML IJ SOLN
2.5000 mg | Freq: Once | INTRAMUSCULAR | Status: AC
  Administered 2021-11-09: 2.5 mg via INTRAVENOUS

## 2021-11-09 MED ORDER — DIAZEPAM 5 MG/ML IJ SOLN
INTRAMUSCULAR | Status: AC
  Filled 2021-11-09: qty 2

## 2021-11-09 MED ORDER — DIAZEPAM 5 MG/ML IJ SOLN
2.5000 mg | INTRAMUSCULAR | Status: DC | PRN
  Administered 2021-11-09 – 2021-11-13 (×2): 2.5 mg via INTRAVENOUS
  Filled 2021-11-09 (×2): qty 2

## 2021-11-09 MED ORDER — HYDROMORPHONE HCL 1 MG/ML IJ SOLN
1.0000 mg | INTRAMUSCULAR | Status: DC | PRN
  Administered 2021-11-09 – 2021-11-18 (×15): 1 mg via INTRAVENOUS
  Filled 2021-11-09 (×15): qty 1

## 2021-11-09 NOTE — Progress Notes (Signed)
NAME:  Teresa Cook, MRN:  [id number], DOB:  05-19-1955, LOS: 4 ADMISSION DATE:  11/05/2021, CONSULTATION DATE:  11/05/2021 REFERRING MD:  Dr. Karma Greaser, CHIEF COMPLAINT:  Back/abdominal pain, loss of consciousness   Brief Pt Description / Synopsis:  66 y.o female with PMH significant for ESRD on HD, A. Fib & DVT on Warfarin , admitted with Hemorrhagic shock due to right renal hemorrhage with large hematoma in setting of supratherapeutic INR (INR >9).  Vascular Surgery consulted, required coil embolization of the right renal artery.  History of Present Illness:  Teresa Cook is a 66 year old female with a past medical history significant for ESRD on hemodialysis with chronic PermCath, CHF, DVT, cerebral meningioma who presents to [facility] ED on 11/05/2021 status post fall and loss of consciousness.  Patient reports she lives alone and passed out at least 4 times overnight, and when going to the bathroom around 5 AM this morning,  she passed out again it and thinks she hit her head.  Following the fall she also reports development of back/flank pain that radiates around to the right lower quadrant.  She reports she has been compliant with her hemodialysis, last session on Friday.  She denies chest pain, shortness of breath.  She reports compliance with her warfarin for anticoagulation.  ED Course: Initial Vital Signs: Temperature 97.6 F rectally, blood pressure 125/81, pulse 74, respiratory rate 25 Significant Labs: Bicarb 19, glucose 185, BUN 65, creatinine 10.38, albumin 2.7, BNP 128, high-sensitivity troponin 11, lactic acid 3.8, WBC 12.9 with neutrophilia, hemoglobin 8.0, hematocrit 25.1, prothrombin time 74.3, INR 9.2, APTT 36, TSH 3.3, free T4 0.84 COVID-19 PCR negative Imaging Chest X-ray>>IMPRESSION: 1. No active disease. CT head without contrast>>IMPRESSION: 1. Broad-based right anterior convexity scalp hematoma without underlying skull fracture. 2.  No acute traumatic injury  identified to the brain 3. But slowly enlarging chronic midline posterior fossa Meningioma, now up to 28 mm long axis versus 17 mm in 2017. No associated edema or significant mass effect. Recommend follow-up with Neurosurgery. CT cervical spine>>IMPRESSION: 1. No acute traumatic injury identified in the cervical spine. Mild for age cervical spine degeneration. 2. Aortic Atherosclerosis (ICD10-I70.0). Left-side dialysis catheter. CTA chest/abdomen/pelvis>>IMPRESSION: 1. Positive for Severe Acute Right Renal hemorrhage. Underlying chronic renal disease of dialysis with Active Renal Vessel Contrast Extravasation visible on delayed series 13, and large volume retroperitoneal/intraperitoneal hemorrhage of > 600 mL. This Critical Value/emergent result called by telephone at the time of interpretation on 11/05/2021 at 6:49 am to Dr. Hinda Kehr , who verbally acknowledged these results. 2. No aortic dissection (but see #3) or other significant acute finding; small right pleural and pericardial effusions appear to be transudate and are of doubtful significance. 3. 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 Medications Administered: Cefepime and vancomycin.  Vitamin K, Kcentra, Levophed  PCCM asked to admit the patient for further work-up and treatment.  Vascular surgery has been consulted.  Please see "significant hospital events" section below for full detailed hospital course. Pertinent  Medical History   Past Medical History:  Diagnosis Date   Anemia    Anxiety    Breast cancer ([place]) 01/2016   bilateral   CHF (congestive heart failure) (HCC)    Chronic kidney disease    Depression    Dialysis patient [facility])    DVT (deep venous thrombosis) ([place])    left leg   DVT (deep venous thrombosis) ([place]) 1985   right thigh  Dysrhythmia    Gout    Headache    HTN (hypertension)     Hypertension    Parathyroid abnormality (HCC)    Parathyroid disease ([place])    Pneumonia 12/2015   Psoriasis    Renal insufficiency    Sickle cell trait ([place])    traits     Micro Data:  10/30: SARS-CoV-2 PCR>>negative 10/30: Urine>> 10/30: Blood culture x2>>negative  10/30: MRSA PCR>>negative  Antimicrobials:  Cefepime 10/30 x1 dose Flagyl 10/30 x1 dose Vancomycin 10/30 x1 dose  Significant Hospital Events: Including procedures, antibiotic start and stop dates in addition to other pertinent events   10/30: Presented to ED, found to have hemorrhagic shock due to right renal hemorrhage.  Coumadin reversed with Kcentra and Vitamin K.  2 units of FFP and 2 unit of pRBCs administered.  Vascular Surgery performed embolization of right renal artery 10/31: Weaned off pressors, lactic normal. New thrombocytopenia, DIC workup negative.  Continues to complain of significant right sided abdominal/flank pain, will repeat CT Abdomen & Pelvis.  Transfuse 1 unit pRBCs for Hgb 6.5 10/31: [place] Bilateral Carotid revealed right carotid artery        system: Less than 50% stenosis secondary to mild multifocal        atherosclerotic plaque formation. Left carotid artery system:        Less than 50% stenosis secondary to mild multifocal        atherosclerotic plaque formation. Vertebral artery system:        Patent with antegrade flow bilaterally 11/1: Pt remains on amiodarone gtt and precedex gtt.  States pain is better controlled.  In sinus rhythm rate controlled.  During HD session pt developed atrial fibrillation with rvr and hypotension requiring neo-synephrine gtt.  Required 1 unit pRBC due to hgb 6.9 post transfusion hgb 8.0 no signs of bleeding  11/2: Pt remains on amiodarone gtt, neo-synephrine gtt '@20'$  mcg/min, and precedex gtt '@0'$ .7 mcg/kg/hr.   11/3 DNR/DNI status, off pressors  Interim History / Subjective:  Off pressors Resting comfortably Prognosis is poor DNR/DNI +afib on  AMIO  Objective   Blood pressure 122/72, pulse 72, temperature 99.1 F (37.3 C), temperature source Oral, resp. rate 19, weight 59.7 kg, SpO2 95 %.        Intake/Output Summary (Last 24 hours) at 11/09/2021 0734 Last data filed at 11/09/2021 0600 Gross per 24 hour  Intake 989.39 ml  Output 1500 ml  Net -510.61 ml    Filed Weights   11/07/21 1352 11/08/21 0500 11/09/21 0427  Weight: 61 kg 62.1 kg 59.7 kg      Review of Systems: NAD, no SOB, +abd pain Other:  All other systems negative  Physical Examination:   General Appearance: Acute on chronically-ill appearing female, NAD resting in bed  EYES PERRLA, EOM intact.   NECK Supple, No JVD Pulmonary: normal breath sounds, No wheezing.  CardiovascularNormal S1,S2.  No m/r/g.   Abdomen: severe RUQ/RLQ tenderness  ALL OTHER ROS ARE NEGATIVE     Assessment & Plan:   Hemorrhagic shock in the setting of right renal hemorrhage~ RESOLVED Afibb with RVR~resolved  Intermittent runs of Vtach~resolved  Hypotension: Cardiogenic and hemorrhagic shock -wean off pressors as tolerated PMHx: HFpEF, DVT, A. Fib on Warfarin, HTN Echocardiogram 11/06/21: EF 55 to 08%; grade I diastolic dysfunction  -CT Head on presentation negative for acute intracranial abnormality     Acute blood loss anemia Supratherapeutic INR s/p coumadin reversal~IMPROVED Thrombocytopenia PMHx: Pt on outpatient coumadin for A.fib &  DVT - SCD's for VTE Prophylaxis  - Transfuse for Hgb <7  Unable to anticoagulate at this time due to high risk of bleeding  ESRD ON HD -continue Foley Catheter-assess need -Avoid nephrotoxic agents -Follow urine output, BMP -Ensure adequate renal perfusion, optimize oxygenation -Renal dose medications   Intake/Output Summary (Last 24 hours) at 11/09/2021 0737 Last data filed at 11/09/2021 0600 Gross per 24 hour  Intake 989.39 ml  Output 1500 ml  Net -510.61 ml     ENDO - ICU hypoglycemic\Hyperglycemia  protocol -check FSBS per protocol   GI GI PROPHYLAXIS as indicated PAIN FROM HEMORRHAGE, PAIN MEDS AS NEEDED EMPIRIC  ABX STARTED   NUTRITIONAL STATUS DIET-->s as tolerated Constipation protocol as indicated   ELECTROLYTES -follow labs as needed -replace as needed -pharmacy consultation and following  Best Practice (right click and "Reselect all SmartList Selections" daily)  Diet/type: Full Liquid Diet will advance as tolerated  DVT prophylaxis: SCD GI prophylaxis: N/A Lines: Central line and yes and it is still needed Foley:  N/A Code Status:  DNR/DNI   11/2: Updated pt regarding plan of care and all questions answered  Labs   CBC: Recent Labs  Lab 11/05/21 0320 11/05/21 1003 11/06/21 0435 11/06/21 0436 11/06/21 0552 11/07/21 0512 11/07/21 0612 11/08/21 0216 11/08/21 1401 11/08/21 1838 11/09/21 0004 11/09/21 0515  WBC 12.9*  --  13.4* 13.7*  --  14.4*  --  12.6*  --   --   --  11.3*  NEUTROABS 9.5*  --  10.6*  --   --  11.8*  --  10.4*  --   --   --  9.2*  HGB 8.0*   < > 7.3* 6.9*   < > 6.9*   < > 8.0* 8.3* 9.3* 8.5* 9.2*  HCT 25.1*   < > 21.1* 20.8*   < > 20.3*   < > 23.0* 24.3* 26.6* 24.6* 26.6*  MCV 87.5  --  80.8 84.9  --  82.9  --  82.1  --   --   --  81.3  PLT 174  --  73* 87*  --  81*  --  89*  --   --   --  122*   < > = values in this interval not displayed.     Basic Metabolic Panel: Recent Labs  Lab 11/05/21 0320 11/06/21 0436 11/06/21 1526 11/07/21 0512 11/08/21 0216 11/09/21 0515  NA 141 138  --  135 133* 135  K 4.7 4.6 4.7 5.2* 4.0 4.0  CL 107 100  --  99 96* 93*  CO2 19* 26  --  '24 28 29  '$ GLUCOSE 185* 105*  --  97 116* 106*  BUN 65* 49*  --  63* 34* 25*  CREATININE 10.38* 8.25*  --  10.10* 5.91* 4.88*  CALCIUM 7.8* 8.3*  --  8.8* 9.3 9.5  MG  --  1.8 1.7 1.9 1.7 2.2  PHOS  --  7.1*  --  8.6* 5.0* 5.4*    GFR: Estimated Creatinine Clearance: 10.1 mL/min (A) (by C-G formula based on SCr of 4.88 mg/dL (H)). Recent Labs  Lab  11/05/21 1003 11/05/21 1322 11/05/21 2050 11/06/21 0435 11/06/21 0436 11/06/21 0821 11/07/21 0512 11/08/21 0216 11/09/21 0515  PROCALCITON  --   --   --   --  2.08  --   --   --   --   WBC  --   --   --    < > 13.7*  --  14.4* 12.6* 11.3*  LATICACIDVEN 4.6* 1.5 1.2  --   --  1.2  --   --   --    < > = values in this interval not displayed.     Liver Function Tests: Recent Labs  Lab 11/05/21 0320 11/07/21 0512  AST 19  --   ALT 11  --   ALKPHOS 58  --   BILITOT 0.5  --   PROT 5.1*  --   ALBUMIN 2.7* 2.8*    No results for input(s): "LIPASE", "AMYLASE" in the last 168 hours. No results for input(s): "AMMONIA" in the last 168 hours.  ABG    Component Value Date/Time   PHART 7.35 07/27/2021 1218   PCO2ART 41 07/27/2021 1218   PO2ART 274 (H) 07/27/2021 1218   HCO3 22.6 07/27/2021 1218   ACIDBASEDEF 2.9 (H) 07/27/2021 1218   O2SAT 99.9 07/27/2021 1218     Coagulation Profile: Recent Labs  Lab 11/05/21 0450 11/05/21 1003 11/06/21 0436 11/07/21 0512 11/08/21 1838  INR 9.2* 1.4* 1.4* 1.5* 1.5*     Cardiac Enzymes: No results for input(s): "CKTOTAL", "CKMB", "CKMBINDEX", "TROPONINI" in the last 168 hours.  HbA1C: Hemoglobin A1C  Date/Time Value Ref Range Status  12/29/2013 05:04 AM 5.0 4.2 - 6.3 % Final    Comment:    The American Diabetes Association recommends that a primary goal of therapy should be <7% and that physicians should reevaluate the treatment regimen in patients with HbA1c values consistently >8%.    Hgb A1c MFr Bld  Date/Time Value Ref Range Status  11/06/2021 04:36 AM 5.6 4.8 - 5.6 % Final    Comment:    (NOTE) Pre diabetes:          5.7%-6.4%  Diabetes:              >6.4%  Glycemic control for   <7.0% adults with diabetes   07/08/2021 05:05 PM 4.9 4.8 - 5.6 % Final    Comment:    (NOTE)         Prediabetes: 5.7 - 6.4         Diabetes: >6.4         Glycemic control for adults with diabetes: <7.0     CBG: Recent Labs   Lab 11/08/21 1608 11/08/21 1920 11/08/21 2330 11/09/21 0401 11/09/21 0722  GLUCAP 107* 101* 122* 101* 100*    SD status Transfer to [facility]     Corrin Parker, M.D.  [facility]  Medical Director [place] Director [facility] Cardio-Pulmonary Department

## 2021-11-09 NOTE — Progress Notes (Signed)
Patient started stating that "something's not right."- Her HR became elevated to as high as 160s SVT- but not sustain- she said she felt nauseated and also that she felt like she could "poop" at the same time.  Other vitals stable- Notified Dr. Saunders Revel- to change metoprolol to IV- then patient asked for medication to help with anxiety. Valium given as well with zofran.  KUB ordered and patient has ileus- per Dr. Mortimer Fries patient refuses to have NG placed.  Patient seems more comfortable at this time. Family at bedside.

## 2021-11-09 NOTE — Progress Notes (Signed)
Rounding Note    Patient Name: Teresa Cook Date of Encounter: 11/09/2021  [place] Cardiologist: None   Subjective   Patient seen on AM rounds. Denies chest pain or shortness of breath. Hgb remains stable at 9.2. Continued on amiodarone drip. Remains in atrial fibrillation this morning.  Inpatient Medications    Scheduled Meds:  Chlorhexidine Gluconate Cloth  6 each Topical Q0600   feeding supplement (NEPRO CARB STEADY)  237 mL Oral TID BM   multivitamin  1 tablet Oral QHS   Continuous Infusions:  sodium chloride Stopped (11/05/21 0939)   amiodarone 60 mg/hr (11/09/21 0600)   anticoagulant sodium citrate     dexmedetomidine (PRECEDEX) IV infusion Stopped (11/08/21 1650)   phenylephrine (NEO-SYNEPHRINE) Adult infusion Stopped (11/09/21 0350)   piperacillin-tazobactam (ZOSYN)  IV 2.25 g (11/09/21 0526)   PRN Meds: acetaminophen, alteplase, anticoagulant sodium citrate, docusate sodium, heparin, HYDROmorphone (DILAUDID) injection, lidocaine (PF), lidocaine-prilocaine, mouth rinse, oxyCODONE, pentafluoroprop-tetrafluoroeth, polyethylene glycol   Vital Signs    Vitals:   11/09/21 0500 11/09/21 0600 11/09/21 0700 11/09/21 0800  BP: (!) 144/98 122/72 (!) 117/91 115/69  Pulse: 94 72 71 (!) 52  Resp: (!) '21 19 20 17  '$ Temp:    98 F (36.7 C)  TempSrc:    Oral  SpO2: 93% 95% 100% 97%  Weight:        Intake/Output Summary (Last 24 hours) at 11/09/2021 0843 Last data filed at 11/09/2021 0600 Gross per 24 hour  Intake 926.94 ml  Output 1500 ml  Net -573.06 ml      11/09/2021    4:27 AM 11/08/2021    5:00 AM 11/07/2021    1:52 PM  Last 3 Weights  Weight (lbs) 131 lb 9.8 oz 136 lb 14.5 oz 134 lb 7.7 oz  Weight (kg) 59.7 kg 62.1 kg 61 kg      Telemetry    Atrial fibrillation rates 80-130, several episodes of 6-8 beat runs of NSVT - Personally Reviewed  ECG    No new tracings - Personally Reviewed  Physical Exam   GEN: No acute distress. Laying in  bed without complaints Neck: No JVD Cardiac: irregularly irregular, no murmurs, rubs, or gallops.  Respiratory: Diminished to auscultation bilaterally anteriorly. Respirations remain unlabored at rest on 2L O2 via [place] GI: Soft, tender to mild palpitation, non-distended  MS: No edema; No deformity. Neuro:  Nonfocal  Psych: Normal affect   Labs    High Sensitivity Troponin:   Recent Labs  Lab 11/05/21 0450 11/06/21 0821 11/06/21 0950 11/06/21 1526 11/06/21 1743  TROPONINIHS '10 17 16 17 16     '$ Chemistry Recent Labs  Lab 11/05/21 0320 11/06/21 0436 11/07/21 0512 11/08/21 0216 11/09/21 0515  NA 141   < > 135 133* 135  K 4.7   < > 5.2* 4.0 4.0  CL 107   < > 99 96* 93*  CO2 19*   < > '24 28 29  '$ GLUCOSE 185*   < > 97 116* 106*  BUN 65*   < > 63* 34* 25*  CREATININE 10.38*   < > 10.10* 5.91* 4.88*  CALCIUM 7.8*   < > 8.8* 9.3 9.5  MG  --    < > 1.9 1.7 2.2  PROT 5.1*  --   --   --   --   ALBUMIN 2.7*  --  2.8*  --   --   AST 19  --   --   --   --  ALT 11  --   --   --   --   ALKPHOS 58  --   --   --   --   BILITOT 0.5  --   --   --   --   GFRNONAA 4*   < > 4* 7* 9*  ANIONGAP 15   < > '12 9 13   '$ < > = values in this interval not displayed.    Lipids No results for input(s): "CHOL", "TRIG", "HDL", "LABVLDL", "LDLCALC", "CHOLHDL" in the last 168 hours.  Hematology Recent Labs  Lab 11/07/21 0512 11/07/21 0612 11/08/21 0216 11/08/21 1401 11/08/21 1838 11/09/21 0004 11/09/21 0515  WBC 14.4*  --  12.6*  --   --   --  11.3*  RBC 2.45*  --  2.80*  --   --   --  3.27*  HGB 6.9*   < > 8.0*   < > 9.3* 8.5* 9.2*  HCT 20.3*   < > 23.0*   < > 26.6* 24.6* 26.6*  MCV 82.9  --  82.1  --   --   --  81.3  MCH 28.2  --  28.6  --   --   --  28.1  MCHC 34.0  --  34.8  --   --   --  34.6  RDW 15.9*  --  15.7*  --   --   --  15.8*  PLT 81*  --  89*  --   --   --  122*   < > = values in this interval not displayed.   Thyroid  Recent Labs  Lab 11/05/21 0658 11/06/21 1526  TSH  3.354 2.080  FREET4 0.84  --     BNP Recent Labs  Lab 11/05/21 0320  BNP 128.8*    DDimer  Recent Labs  Lab 11/06/21 0950  DDIMER 16.58*     Radiology    US Abdomen Limited RUQ (LIVER/GB)  Result Date: 11/08/2021 CLINICAL DATA:  Abdomen pain EXAM: ULTRASOUND ABDOMEN LIMITED RIGHT UPPER QUADRANT COMPARISON:  CT 11/06/2021, 11/05/2021 FINDINGS: Gallbladder: No gallstones or wall thickening visualized. No sonographic Murphy sign noted by sonographer. Common bile duct: Diameter: 3.1 mm Liver: No focal lesion identified. Within normal limits in parenchymal echogenicity. Portal vein is patent on color Doppler imaging with normal direction of blood flow towards the liver. Other: Small free fluid adjacent to the inferior margin of right liver. Heterogenous complex fluid collection in the right flank measuring 11.2 x 6.6 x 13.2 cm, corresponding to known retroperitoneal hematoma. Heterogenous appearing echogenic right kidney, likely due to chronic kidney disease and hematoma previously demonstrated on CT. IMPRESSION: 1. Negative for gallstones or biliary dilatation. 2. Large heterogenous complex fluid collection in the right flank measuring up to 13.2 cm, felt to correspond to CT demonstrated retroperitoneal hematoma. 3. Small free fluid adjacent to the inferior margin of right liver. 4. Heterogenous appearing right kidney, likely due to chronic kidney disease and hematoma previously demonstrated on CT. Electronically Signed   By: Donavan Foil M.D.   On: 11/08/2021 16:25    Cardiac Studies   2D echo 11/06/2021: 1. Left ventricular ejection fraction, by estimation, is 50 to 55%. The  left ventricle has low normal function. The left ventricle has no regional  wall motion abnormalities. There is mild concentric left ventricular  hypertrophy. Left ventricular  diastolic parameters are consistent with Grade I diastolic dysfunction  (impaired relaxation).   2. Right ventricular systolic function is  normal. The right ventricular  size is normal.   3. The mitral valve is normal in structure. Trivial mitral valve  regurgitation.   4. The aortic valve is calcified. Aortic valve regurgitation is not  visualized. Aortic valve sclerosis/calcification is present, without any  evidence of aortic stenosis.   Patient Profile     66 y.o. female with a history of ESRD on hemodialysis, HFpEF, PAF on warfarin, DVT, cerebral meningioma, depression, and breast cancer, who is being seen and evaluated for A-fib RVR and NSVT.  Assessment & Plan    Atrial fibrillation with RVR -currently in atrial fibrillation -remains on amiodarone drip, had to be reloaded yesterday due to recurrence of atrial fibrillation -unable to place on anticoagulation due to significant anemia and right renal hemorrhage -transition to oral amiodarone prior to discharge -recommend keeping potassium 4 and mg 2 -continue with cardiac monitoring  NSVT -asymptomatic -several runs noted overnight on telemetry -continued on IV amiodarone -continue telemetry  HFpEF -volume management per hemodialysis -LVEF 50-55%, NRWMA, G1DD, and trivial MR -remain cautious with fluids -daily weight and I&O's  Hemorrhagic shock secondary to right renal hemorrhage with hypovolemic syncope and acute blood loss anemia in the setting of supratherapeutic INR -hgb 9.2 today -INR 1.5 -vasopressors stopped 11/3 at 0350 -s/p right renal artery embolization on 11/05/21 -management per primary team  ESRD on HD -management per Nephrology      For questions or updates, please contact [place] Please consult [email or web address] for contact info under        Signed, Jameela Michna, NP  11/09/2021, 8:43 AM

## 2021-11-09 NOTE — Progress Notes (Signed)
Physical Therapy Treatment Patient Details Name: Teresa Cook MRN: [id number] DOB: 1955/08/22 Today's Date: 11/09/2021   History of Present Illness Patient is a 66 year old female with medical history significant for ESRD on HD, A. Fib & DVT on Warfarin , admitted with Hemorrhagic shock due to right renal hemorrhage with large hematoma in setting of supratherapeutic INR (INR >9).  Vascular Surgery consulted, required coil embolization of the right renal artery.    PT Comments    Pt received upright in bed agreeable to participate. Pt transfers from supine to sitting supervision with increased time and bed features and standing with minA without AD with LE buckling and tremulous activity. As pt stands, LE's demonstrate reduced buckling leading to adequate standing balance.  Pt able to step transfer from bed to recliner and sit in recliner. Frequent elevation of HR from low 100's at rest up to 130-140 BPM with STS and stand step transfer with desat to mid to low 80's. However abnormal pleth readings during mobility due to use of UE's with pt denying SOB. SPO2 returns to > 90% with rest. Education provided on LE therex with performance in recliner. Pt left in recliner with all needs in reach with family member at bedside. Pt making progress in mobility but still demonstrates deficits in LE strength and standing tolerance. D/c recs remain appropriate. May benefit from additional DME if d/c soon.    Recommendations for follow up therapy are one component of a multi-disciplinary discharge planning process, led by the attending physician.  Recommendations may be updated based on patient status, additional functional criteria and insurance authorization.  Follow Up Recommendations  Home health PT     Assistance Recommended at Discharge Intermittent Supervision/Assistance  Patient can return home with the following A little help with walking and/or transfers;A little help with  bathing/dressing/bathroom;Help with stairs or ramp for entrance;Assist for transportation;Assistance with cooking/housework   Equipment Recommendations  Rolling walker (2 wheels)    Recommendations for Other Services       Precautions / Restrictions Precautions Precautions: Fall Restrictions Weight Bearing Restrictions: No     Mobility  Bed Mobility Overal bed mobility: Needs Assistance Bed Mobility: Supine to Sit     Supine to sit: Supervision, HOB elevated     General bed mobility comments: increased time Patient Response: Cooperative  Transfers Overall transfer level: Needs assistance Equipment used: None Transfers: Sit to/from Stand, Bed to chair/wheelchair/BSC Sit to Stand: Min assist   Step pivot transfers: Min guard       General transfer comment: Pt able to stand and step pivot to recliner this date without AD. INitially buckling of knees and tremulous LE's but this improves with time in standing before transfer.    Ambulation/Gait                   Stairs             Wheelchair Mobility    Modified Rankin (Stroke Patients Only)       Balance Overall balance assessment: Needs assistance Sitting-balance support: Feet supported Sitting balance-Leahy Scale: Fair     Standing balance support: Single extremity supported Standing balance-Leahy Scale: Fair                              Cognition Arousal/Alertness: Awake/alert Behavior During Therapy: WFL for tasks assessed/performed Overall Cognitive Status: Within Functional Limits for tasks assessed  Exercises General Exercises - Lower Extremity Ankle Circles/Pumps: AROM, Strengthening, Both, 10 reps, Seated Long Arc Quad: AROM, Strengthening, Both, 10 reps, Seated Hip Flexion/Marching: AROM, Strengthening, Both, 10 reps, Seated Toe Raises: AROM, Strengthening, Both, 10 reps, Seated Heel Raises: AROM, Seated,  Strengthening, Both, 10 reps Other Exercises Other Exercises: Benefits of functional mobility and transfers with nursing staff to improve strength and independence.    General Comments        Pertinent Vitals/Pain Pain Assessment Pain Assessment: Faces Faces Pain Scale: Hurts little more Pain Location: R groin with standing Pain Descriptors / Indicators: Guarding Pain Intervention(s): Limited activity within patient's tolerance, Monitored during session, Repositioned    Home Living                          Prior Function            PT Goals (current goals can now be found in the care plan section) Acute Rehab PT Goals Patient Stated Goal: to get better and go home PT Goal Formulation: With patient Time For Goal Achievement: 11/22/21 Potential to Achieve Goals: Fair Progress towards PT goals: Progressing toward goals    Frequency    Min 2X/week      PT Plan Current plan remains appropriate    Co-evaluation              AM-PAC PT "6 Clicks" Mobility   Outcome Measure  Help needed turning from your back to your side while in a flat bed without using bedrails?: None Help needed moving from lying on your back to sitting on the side of a flat bed without using bedrails?: A Little Help needed moving to and from a bed to a chair (including a wheelchair)?: A Little Help needed standing up from a chair using your arms (e.g., wheelchair or bedside chair)?: A Little Help needed to walk in hospital room?: A Little Help needed climbing 3-5 steps with a railing? : A Lot 6 Click Score: 18    End of Session Equipment Utilized During Treatment: Oxygen Activity Tolerance: Patient tolerated treatment well;Patient limited by fatigue Patient left: in chair;with call bell/phone within reach;with family/visitor present Nurse Communication: Mobility status PT Visit Diagnosis: Unsteadiness on feet (R26.81);Muscle weakness (generalized) (M62.81)     Time:  2035-5974 PT Time Calculation (min) (ACUTE ONLY): 23 min  Charges:  $Therapeutic Exercise: 23-37 mins                    Teresa Cook M. Fairly IV, PT, DPT Physical Therapist- [facility]  11/09/2021, 12:35 PM

## 2021-11-09 NOTE — Consult Note (Signed)
PHARMACY CONSULT NOTE  Pharmacy Consult for Electrolyte Monitoring and Replacement   Recent Labs: Potassium (mmol/L)  Date Value  11/09/2021 4.0  02/03/2014 4.0   Magnesium (mg/dL)  Date Value  11/09/2021 2.2   Calcium (mg/dL)  Date Value  11/09/2021 9.5   Calcium, Total (mg/dL)  Date Value  02/03/2014 8.6   Albumin (g/dL)  Date Value  11/07/2021 2.8 (L)  02/02/2014 3.5   Phosphorus (mg/dL)  Date Value  11/09/2021 5.4 (H)  01/17/2014 4.9   Sodium (mmol/L)  Date Value  11/09/2021 135  02/29/2020 140  02/03/2014 138   Corrected Calcium: 9.3 mg/dL  Assessment: Patient is a 66 y/o F with medical history including depression / anxiety, DVT, 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 Patient subsequently found to have severe acute right renal hemorrhage in setting of supratherapeutic INR on warfarin. Patient ordered emergent reversal with Kcentra and vitamin K. Pharmacy consulted to assist with electrolyte monitoring and replacement as indicated.  Hyperphosphatemia d/t renal osteodystrophy- managed outpatient.   Goal of Therapy:  Within normal limits  Plan:  --No replacement warranted --Follow-up electrolytes with AM labs tomorrow  Wynelle Cleveland 11/09/2021 8:04 AM

## 2021-11-09 NOTE — Progress Notes (Signed)
[facility]  ROUNDING NOTE   Subjective:   Teresa Cook is a 66 year old African-American female with past medical conditions including hypertension, gout, anxiety with depression, sickle cell trait, systolic heart failure with a EF 40 to 45%, breast cancer with bilateral mastectomy, seizures, and end-stage renal disease on hemodialysis.  Patient presents to the emergency department via EMS for multiple syncopal episodes.  Patient has been admitted for Right flank pain [R10.9] Elevated lactic acid level [R79.89] Hypotension [I95.9] Hypotension, unspecified hypotension type [I95.9] Altered mental status, unspecified altered mental status type [R41.82]  Patient is known to our practice and receives outpatient dialysis treatments at [facility] on a MWF schedule, supervised by Dr. Candiss Norse.    Patient seen sitting up in the bed, currently attempting to eat breakfast.  Patient states appetite remains poor. Remains on 2 L nasal cannula, alert and oriented Patient states she feels unwell, ill-appearing Remains on amiodarone drip Abdominal pain remains unchanged No lower extremity edema  Objective:  Vital signs in last 24 hours:  Temp:  [98 F (36.7 C)-102.4 F (39.1 C)] 98 F (36.7 C) (11/03 0800) Pulse Rate:  [52-131] 120 (11/03 1000) Resp:  [15-38] 20 (11/03 1000) BP: (90-165)/(53-110) 155/103 (11/03 1000) SpO2:  [88 %-100 %] 94 % (11/03 1000) Weight:  [59.7 kg] 59.7 kg (11/03 0427)  Weight change: -1.9 kg Filed Weights   11/07/21 1352 11/08/21 0500 11/09/21 0427  Weight: 61 kg 62.1 kg 59.7 kg    Intake/Output: I/O last 3 completed shifts: In: 1780.7 [I.V.:1142.1; Blood:350; IV Piggyback:288.7] Out: 1500 [Other:1500]   Intake/Output this shift:  No intake/output data recorded.  Physical Exam: General: Ill appearing  Head: Normocephalic, atraumatic. Moist oral mucosal membranes  Eyes: Anicteric  Lungs:  Diminished in bases, normal effort  Heart:  Regular rate and rhythm  Abdomen:  Soft, nontender  Extremities:  No peripheral edema.  Neurologic: Nonfocal, moving all four extremities  Skin: No lesions  Access: Lt Permcath    Basic Metabolic Panel: Recent Labs  Lab 11/05/21 0320 11/06/21 0436 11/06/21 1526 11/07/21 0512 11/08/21 0216 11/09/21 0515  NA 141 138  --  135 133* 135  K 4.7 4.6 4.7 5.2* 4.0 4.0  CL 107 100  --  99 96* 93*  CO2 19* 26  --  '24 28 29  '$ GLUCOSE 185* 105*  --  97 116* 106*  BUN 65* 49*  --  63* 34* 25*  CREATININE 10.38* 8.25*  --  10.10* 5.91* 4.88*  CALCIUM 7.8* 8.3*  --  8.8* 9.3 9.5  MG  --  1.8 1.7 1.9 1.7 2.2  PHOS  --  7.1*  --  8.6* 5.0* 5.4*     Liver Function Tests: Recent Labs  Lab 11/05/21 0320 11/07/21 0512  AST 19  --   ALT 11  --   ALKPHOS 58  --   BILITOT 0.5  --   PROT 5.1*  --   ALBUMIN 2.7* 2.8*    No results for input(s): "LIPASE", "AMYLASE" in the last 168 hours. No results for input(s): "AMMONIA" in the last 168 hours.  CBC: Recent Labs  Lab 11/05/21 0320 11/05/21 1003 11/06/21 0435 11/06/21 0436 11/06/21 0552 11/07/21 0512 11/07/21 0612 11/08/21 0216 11/08/21 1401 11/08/21 1838 11/09/21 0004 11/09/21 0515  WBC 12.9*  --  13.4* 13.7*  --  14.4*  --  12.6*  --   --   --  11.3*  NEUTROABS 9.5*  --  10.6*  --   --  11.8*  --  10.4*  --   --   --  9.2*  HGB 8.0*   < > 7.3* 6.9*   < > 6.9*   < > 8.0* 8.3* 9.3* 8.5* 9.2*  HCT 25.1*   < > 21.1* 20.8*   < > 20.3*   < > 23.0* 24.3* 26.6* 24.6* 26.6*  MCV 87.5  --  80.8 84.9  --  82.9  --  82.1  --   --   --  81.3  PLT 174  --  73* 87*  --  81*  --  89*  --   --   --  122*   < > = values in this interval not displayed.     Cardiac Enzymes: No results for input(s): "CKTOTAL", "CKMB", "CKMBINDEX", "TROPONINI" in the last 168 hours.  BNP: Invalid input(s): "POCBNP"  CBG: Recent Labs  Lab 11/08/21 1920 11/08/21 2330 11/09/21 0401 11/09/21 0722 11/09/21 1221  GLUCAP 101* 122* 101* 100* 51*      Microbiology: Results for orders placed or performed during the hospital encounter of 11/05/21  Blood Culture (routine x 2)     Status: None (Preliminary result)   Collection Time: 11/05/21  3:20 AM   Specimen: BLOOD  Result Value Ref Range Status   Specimen Description BLOOD RIGHT FA  Final   Special Requests   Final    BOTTLES DRAWN AEROBIC AND ANAEROBIC Blood Culture results may not be optimal due to an inadequate volume of blood received in culture bottles   Culture   Final    NO GROWTH 4 DAYS Performed at [facility], [address], [place], [place] [address]    Report Status PENDING  Incomplete  SARS Coronavirus 2 by RT PCR (hospital order, performed in [facility] hospital lab) *cepheid single result test* Anterior Nasal Swab     Status: None   Collection Time: 11/05/21  3:20 AM   Specimen: Anterior Nasal Swab  Result Value Ref Range Status   SARS Coronavirus 2 by RT PCR NEGATIVE NEGATIVE Final    Comment: (NOTE) SARS-CoV-2 target nucleic acids are NOT DETECTED.  The SARS-CoV-2 RNA is generally detectable in upper and lower respiratory specimens during the acute phase of infection. The lowest concentration of SARS-CoV-2 viral copies this assay can detect is 250 copies / mL. A negative result does not preclude SARS-CoV-2 infection and should not be used as the sole basis for treatment or other patient management decisions.  A negative result may occur with improper specimen collection / handling, submission of specimen other than nasopharyngeal swab, presence of viral mutation(s) within the areas targeted by this assay, and inadequate number of viral copies (<250 copies / mL). A negative result must be combined with clinical observations, patient history, and epidemiological information.  Fact Sheet for Patients:   https://www.patel.info/  Fact Sheet for Healthcare Providers: https://hall.com/  This test is  not yet approved or  cleared by the [place] FDA and has been authorized for detection and/or diagnosis of SARS-CoV-2 by FDA under an Emergency Use Authorization (EUA).  This EUA will remain in effect (meaning this test can be used) for the duration of the COVID-19 declaration under Section 564(b)(1) of the Act, 21 U.S.C. section 360bbb-3(b)(1), unless the authorization is terminated or revoked sooner.  Performed at [facility], [address] [place], [place] [address]   Blood Culture (routine x 2)     Status: None (Preliminary result)   Collection Time: 11/05/21  3:21 AM   Specimen: BLOOD  Result Value Ref Range Status   Specimen Description BLOOD RIGHT HAND  Final   Special Requests   Final    BOTTLES DRAWN AEROBIC AND ANAEROBIC Blood Culture results may not be optimal due to an inadequate volume of blood received in culture bottles   Culture   Final    NO GROWTH 4 DAYS Performed at [facility], [address], [place], [place] [address]    Report Status PENDING  Incomplete  MRSA Next Gen by PCR, Nasal     Status: None   Collection Time: 11/05/21  6:57 AM   Specimen: Nasal Mucosa; Nasal Swab  Result Value Ref Range Status   MRSA by PCR Next Gen NOT DETECTED NOT DETECTED Final    Comment: (NOTE) The GeneXpert MRSA Assay (FDA approved for NASAL specimens only), is one component of a comprehensive MRSA colonization surveillance program. It is not intended to diagnose MRSA infection nor to guide or monitor treatment for MRSA infections. Test performance is not FDA approved in patients less than 30 years old. Performed at [facility], [address] [place], [place] [address]     Coagulation Studies: Recent Labs    11/07/21 1829 11/08/21 1838  LABPROT 18.0* 17.5*  INR 1.5* 1.5*     Urinalysis: No results for input(s): "COLORURINE", "LABSPEC", "PHURINE", "GLUCOSEU", "HGBUR", "BILIRUBINUR", "KETONESUR", "PROTEINUR", "UROBILINOGEN",  "NITRITE", "LEUKOCYTESUR" in the last 72 hours.  Invalid input(s): "APPERANCEUR"    Imaging: US Abdomen Limited RUQ (LIVER/GB)  Result Date: 11/08/2021 CLINICAL DATA:  Abdomen pain EXAM: ULTRASOUND ABDOMEN LIMITED RIGHT UPPER QUADRANT COMPARISON:  CT 11/06/2021, 11/05/2021 FINDINGS: Gallbladder: No gallstones or wall thickening visualized. No sonographic Murphy sign noted by sonographer. Common bile duct: Diameter: 3.1 mm Liver: No focal lesion identified. Within normal limits in parenchymal echogenicity. Portal vein is patent on color Doppler imaging with normal direction of blood flow towards the liver. Other: Small free fluid adjacent to the inferior margin of right liver. Heterogenous complex fluid collection in the right flank measuring 11.2 x 6.6 x 13.2 cm, corresponding to known retroperitoneal hematoma. Heterogenous appearing echogenic right kidney, likely due to chronic kidney disease and hematoma previously demonstrated on CT. IMPRESSION: 1. Negative for gallstones or biliary dilatation. 2. Large heterogenous complex fluid collection in the right flank measuring up to 13.2 cm, felt to correspond to CT demonstrated retroperitoneal hematoma. 3. Small free fluid adjacent to the inferior margin of right liver. 4. Heterogenous appearing right kidney, likely due to chronic kidney disease and hematoma previously demonstrated on CT. Electronically Signed   By: Donavan Foil M.D.   On: 11/08/2021 16:25     Medications:    sodium chloride Stopped (11/05/21 0939)   amiodarone 60 mg/hr (11/09/21 1054)   anticoagulant sodium citrate     dexmedetomidine (PRECEDEX) IV infusion Stopped (11/08/21 1650)   phenylephrine (NEO-SYNEPHRINE) Adult infusion Stopped (11/09/21 0350)   piperacillin-tazobactam (ZOSYN)  IV 2.25 g (11/09/21 0526)    Chlorhexidine Gluconate Cloth  6 each Topical Q0600   feeding supplement (NEPRO CARB STEADY)  237 mL Oral TID BM   multivitamin  1 tablet Oral QHS   acetaminophen,  alteplase, anticoagulant sodium citrate, docusate sodium, heparin, HYDROmorphone (DILAUDID) injection, lidocaine (PF), lidocaine-prilocaine, mouth rinse, pentafluoroprop-tetrafluoroeth, polyethylene glycol  Assessment/ Plan:  [name] is a 66 y.o.  female is a 66 year old African-American female with past medical conditions including hypertension, gout, anxiety with depression, sickle cell trait, systolic heart failure with a  EF 40 to 45%, breast cancer with bilateral mastectomy, seizures, and end-stage renal disease on hemodialysis.  Patient presents to the emergency department via EMS for multiple syncopal episodes.  Patient has been admitted for Right flank pain [R10.9] Elevated lactic acid level [R79.89] Hypotension [I95.9] Hypotension, unspecified hypotension type [I95.9] Altered mental status, unspecified altered mental status type [R41.82]  [facility]/MWF/Lt Permcath  End-stage renal disease on hemodialysis.  Will maintain outpatient schedule if possible.  Patient received dialysis yesterday due to shortness of breath.  UF 1.5 L achieved.  Patient experienced some V. tach shortly after completion of dialysis.  Will allow patient to rest today and proceed with next dialysis treatment tomorrow.  2. Anemia of chronic kidney disease with acute blood loss Lab Results  Component Value Date   HGB 9.2 (L) 11/09/2021  CT angio abdomen pelvis shows severe hemorrhage at right kidney.  Appreciate vascular surgery performing right renal angiogram with coil embolization to manage hemorrhage.  Patient received 2 units FFP and blood transfusions thus far during admission.  Hemoglobin greatly improved today, back at goal   3. Secondary Hyperparathyroidism: with outpatient labs: PTH 379, phosphorus 5.7, calcium 9.4 on June 18, 2021.   Lab Results  Component Value Date   PTH 214 (H) 07/08/2021   CALCIUM 9.5 11/09/2021   PHOS 5.4 (H) 11/09/2021    We will continue to monitor  bone minerals.  Continue sevelamer with all meals.  4.  Hypotension likely due to blood loss, patient required Neo support with dialysis yesterday.  Currently weaned off pressors.  Amiodarone drip remains  5.  Hyperkalemia, potassium corrected    LOS: 4 [place] 11/3/202312:30 PM

## 2021-11-10 DIAGNOSIS — E861 Hypovolemia: Secondary | ICD-10-CM | POA: Diagnosis not present

## 2021-11-10 DIAGNOSIS — D62 Acute posthemorrhagic anemia: Secondary | ICD-10-CM | POA: Diagnosis not present

## 2021-11-10 DIAGNOSIS — I9589 Other hypotension: Secondary | ICD-10-CM | POA: Diagnosis not present

## 2021-11-10 DIAGNOSIS — I4891 Unspecified atrial fibrillation: Secondary | ICD-10-CM | POA: Diagnosis not present

## 2021-11-10 LAB — CULTURE, BLOOD (ROUTINE X 2)
Culture: NO GROWTH
Culture: NO GROWTH

## 2021-11-10 LAB — CBC WITH DIFFERENTIAL/PLATELET
Abs Immature Granulocytes: 0.08 10*3/uL — ABNORMAL HIGH (ref 0.00–0.07)
Basophils Absolute: 0 10*3/uL (ref 0.0–0.1)
Basophils Relative: 0 %
Eosinophils Absolute: 0.3 10*3/uL (ref 0.0–0.5)
Eosinophils Relative: 4 %
HCT: 24.4 % — ABNORMAL LOW (ref 36.0–46.0)
Hemoglobin: 8.5 g/dL — ABNORMAL LOW (ref 12.0–15.0)
Immature Granulocytes: 1 %
Lymphocytes Relative: 9 %
Lymphs Abs: 0.8 10*3/uL (ref 0.7–4.0)
MCH: 28.1 pg (ref 26.0–34.0)
MCHC: 34.8 g/dL (ref 30.0–36.0)
MCV: 80.5 fL (ref 80.0–100.0)
Monocytes Absolute: 1.2 10*3/uL — ABNORMAL HIGH (ref 0.1–1.0)
Monocytes Relative: 13 %
Neutro Abs: 6.5 10*3/uL (ref 1.7–7.7)
Neutrophils Relative %: 73 %
Platelets: 144 10*3/uL — ABNORMAL LOW (ref 150–400)
RBC: 3.03 MIL/uL — ABNORMAL LOW (ref 3.87–5.11)
RDW: 16 % — ABNORMAL HIGH (ref 11.5–15.5)
WBC: 8.8 10*3/uL (ref 4.0–10.5)
nRBC: 0.2 % (ref 0.0–0.2)

## 2021-11-10 LAB — PROTIME-INR
INR: 1.3 — ABNORMAL HIGH (ref 0.8–1.2)
Prothrombin Time: 16.4 seconds — ABNORMAL HIGH (ref 11.4–15.2)

## 2021-11-10 LAB — BASIC METABOLIC PANEL
Anion gap: 13 (ref 5–15)
BUN: 40 mg/dL — ABNORMAL HIGH (ref 8–23)
CO2: 27 mmol/L (ref 22–32)
Calcium: 10 mg/dL (ref 8.9–10.3)
Chloride: 91 mmol/L — ABNORMAL LOW (ref 98–111)
Creatinine, Ser: 6.75 mg/dL — ABNORMAL HIGH (ref 0.44–1.00)
GFR, Estimated: 6 mL/min — ABNORMAL LOW (ref >60)
Glucose, Bld: 106 mg/dL — ABNORMAL HIGH (ref 70–99)
Potassium: 3.7 mmol/L (ref 3.5–5.1)
Sodium: 131 mmol/L — ABNORMAL LOW (ref 135–145)

## 2021-11-10 LAB — GLUCOSE, CAPILLARY
Glucose-Capillary: 108 mg/dL — ABNORMAL HIGH (ref 70–99)
Glucose-Capillary: 89 mg/dL (ref 70–99)
Glucose-Capillary: 108 mg/dL — ABNORMAL HIGH (ref 70–99)
Glucose-Capillary: 107 mg/dL — ABNORMAL HIGH (ref 70–99)
Glucose-Capillary: 100 mg/dL — ABNORMAL HIGH (ref 70–99)
Glucose-Capillary: 97 mg/dL (ref 70–99)

## 2021-11-10 LAB — PROCALCITONIN: Procalcitonin: 3.96 ng/mL

## 2021-11-10 LAB — PHOSPHORUS: Phosphorus: 6.3 mg/dL — ABNORMAL HIGH (ref 2.5–4.6)

## 2021-11-10 LAB — MAGNESIUM: Magnesium: 2.2 mg/dL (ref 1.7–2.4)

## 2021-11-10 MED ORDER — BISACODYL 10 MG RE SUPP: 10.0000 mg | Freq: Every day | RECTAL | Status: DC | PRN

## 2021-11-10 MED ORDER — POLYETHYLENE GLYCOL 3350 17 G PO PACK
17.0000 g | PACK | Freq: Every day | ORAL | Status: DC
  Administered 2021-11-10 – 2021-11-19 (×2): 17 g via ORAL
  Filled 2021-11-10 (×8): qty 1

## 2021-11-10 MED ORDER — SENNOSIDES-DOCUSATE SODIUM 8.6-50 MG PO TABS
1.0000 | ORAL_TABLET | Freq: Two times a day (BID) | ORAL | Status: DC
  Administered 2021-11-10 – 2021-11-19 (×3): 1 via ORAL
  Filled 2021-11-10 (×14): qty 1

## 2021-11-10 NOTE — Progress Notes (Signed)
Hd treatment of 2.45 hours and 1 liter UF completed without complication.  11/10/21 1752  Vitals  Temp 98.9 F (37.2 C)  Temp Source Oral  BP (!) 156/114  MAP (mmHg) 128  BP Location Right Arm  BP Method Automatic  Patient Position (if appropriate) Lying  Pulse Rate 93  Pulse Rate Source Monitor  ECG Heart Rate 87  Resp (!) 24  Oxygen Therapy  O2 Device Nasal Cannula  O2 Flow Rate (L/min) 3 L/min  Patient Activity (if Appropriate) In bed  Pulse Oximetry Type Continuous  During Treatment Monitoring  Intra-Hemodialysis Comments Tx completed  Post Treatment  Dialyzer Clearance Lightly streaked  Duration of HD Treatment -hour(s) 2.75 hour(s)  Liters Processed 57.7  Fluid Removed 1 mL  Tolerated HD Treatment Yes  Hemodialysis Catheter Left Internal jugular Permanent  Placement Date: (c)   Placed prior to admission: Yes  Orientation: Left  Access Location: Internal jugular  Hemodialysis Catheter Type: Permanent  Site Condition No complications  Blue Lumen Status Blood return noted;Heparin locked;Dead end cap in place  Red Lumen Status Blood return noted;Heparin locked;Dead end cap in place  Purple Lumen Status N/A  Catheter fill solution Heparin 1000 units/ml  Catheter fill volume (Arterial) 1.7 cc  Catheter fill volume (Venous) 1.7  Dressing Type Transparent  Dressing Status Antimicrobial disc in place;Clean, Dry, Intact  Post treatment catheter status Capped and Clamped

## 2021-11-10 NOTE — Progress Notes (Signed)
[facility]  PROGRESS NOTE   Subjective:   Patient is out of bed to chair.  Feels much better today.  Objective:  Vital signs: Blood pressure 103/67, pulse 85, temperature 99.6 F (37.6 C), temperature source Oral, resp. rate 16, weight 61.7 kg, SpO2 100 %.  Intake/Output Summary (Last 24 hours) at 11/10/2021 1235 Last data filed at 11/10/2021 0639 Gross per 24 hour  Intake 1018.23 ml  Output --  Net 1018.23 ml   Filed Weights   11/08/21 0500 11/09/21 0427 11/10/21 0500  Weight: 62.1 kg 59.7 kg 61.7 kg     Physical Exam: General:  No acute distress  Head:  Normocephalic, atraumatic. Moist oral mucosal membranes  Eyes:  Anicteric  Neck:  Supple  Lungs:   Clear to auscultation, normal effort  Heart:  S1S2 no rubs  Abdomen:   Soft, nontender, bowel sounds present  Extremities:  peripheral edema.  Neurologic:  Awake, alert, following commands  Skin:  No lesions  Access:     Basic Metabolic Panel: Recent Labs  Lab 11/06/21 0436 11/06/21 1526 11/07/21 0512 11/08/21 0216 11/09/21 0515 11/10/21 0440  NA 138  --  135 133* 135 131*  K 4.6 4.7 5.2* 4.0 4.0 3.7  CL 100  --  99 96* 93* 91*  CO2 26  --  '24 28 29 27  '$ GLUCOSE 105*  --  97 116* 106* 106*  BUN 49*  --  63* 34* 25* 40*  CREATININE 8.25*  --  10.10* 5.91* 4.88* 6.75*  CALCIUM 8.3*  --  8.8* 9.3 9.5 10.0  MG 1.8 1.7 1.9 1.7 2.2 2.2  PHOS 7.1*  --  8.6* 5.0* 5.4* 6.3*    CBC: Recent Labs  Lab 11/07/21 0512 11/07/21 0612 11/08/21 0216 11/08/21 1401 11/09/21 0004 11/09/21 0515 11/09/21 1254 11/09/21 1643 11/10/21 0440  WBC 14.4*  --  12.6*  --   --  11.3*  --  10.3 8.8  NEUTROABS 11.8*  --  10.4*  --   --  9.2*  --  8.2* 6.5  HGB 6.9*   < > 8.0*   < > 8.5* 9.2* 9.3* 9.2* 8.5*  HCT 20.3*   < > 23.0*   < > 24.6* 26.6* 26.8* 26.8* 24.4*  MCV 82.9  --  82.1  --   --  81.3  --  81.5 80.5  PLT 81*  --  89*  --   --  122*  --  145* 144*   < > = values in this interval not displayed.      Urinalysis: No results for input(s): "COLORURINE", "LABSPEC", "PHURINE", "GLUCOSEU", "HGBUR", "BILIRUBINUR", "KETONESUR", "PROTEINUR", "UROBILINOGEN", "NITRITE", "LEUKOCYTESUR" in the last 72 hours.  Invalid input(s): "APPERANCEUR"    Imaging: DG Abd 1 View  Result Date: 11/09/2021 CLINICAL DATA:  Nausea and vomit. EXAM: ABDOMEN - 1 VIEW COMPARISON:  CT 11/06/2021 FINDINGS: New gaseous gastric distension. There is air throughout prominent small and large bowel. Nonobstructive pattern. No evidence of free air. Coils in the left upper quadrant again seen. Diminishing gallbladder distension, high-density gallbladder contents again seen. Left femoral catheter in place. IMPRESSION: New gaseous gastric distension with air throughout prominent small and large bowel. Overall findings suggestive of generalized ileus. Electronically Signed   By: Keith Rake M.D.   On: 11/09/2021 17:22   US Abdomen Limited RUQ (LIVER/GB)  Result Date: 11/08/2021 CLINICAL DATA:  Abdomen pain EXAM: ULTRASOUND ABDOMEN LIMITED RIGHT UPPER QUADRANT COMPARISON:  CT 11/06/2021, 11/05/2021 FINDINGS: Gallbladder: No  gallstones or wall thickening visualized. No sonographic Murphy sign noted by sonographer. Common bile duct: Diameter: 3.1 mm Liver: No focal lesion identified. Within normal limits in parenchymal echogenicity. Portal vein is patent on color Doppler imaging with normal direction of blood flow towards the liver. Other: Small free fluid adjacent to the inferior margin of right liver. Heterogenous complex fluid collection in the right flank measuring 11.2 x 6.6 x 13.2 cm, corresponding to known retroperitoneal hematoma. Heterogenous appearing echogenic right kidney, likely due to chronic kidney disease and hematoma previously demonstrated on CT. IMPRESSION: 1. Negative for gallstones or biliary dilatation. 2. Large heterogenous complex fluid collection in the right flank measuring up to 13.2 cm, felt to correspond to  CT demonstrated retroperitoneal hematoma. 3. Small free fluid adjacent to the inferior margin of right liver. 4. Heterogenous appearing right kidney, likely due to chronic kidney disease and hematoma previously demonstrated on CT. Electronically Signed   By: Donavan Foil M.D.   On: 11/08/2021 16:25     Medications:    sodium chloride 5 mL/hr at 11/10/21 9179   amiodarone 60 mg/hr (11/10/21 1107)   anticoagulant sodium citrate     piperacillin-tazobactam (ZOSYN)  IV Stopped (11/10/21 1505)    Chlorhexidine Gluconate Cloth  6 each Topical Q0600   feeding supplement (NEPRO CARB STEADY)  237 mL Oral TID BM   metoprolol tartrate  2.5 mg Intravenous Q6H   multivitamin  1 tablet Oral QHS   polyethylene glycol  17 g Oral Daily   senna-docusate  1 tablet Oral BID    Assessment/ Plan:     Principal Problem:   Hypotension Active Problems:   End-stage renal disease on hemodialysis (HCC)   PAF (paroxysmal atrial fibrillation) (HCC)   Anemia due to acute blood loss   Shock (HCC)   Right flank pain   Retroperitoneal bleed   Altered mental status   Elevated lactic acid level   Atrial fibrillation with rapid ventricular response ([place])  66 year old female with history of hypertension, gout, anxiety with depression, sickle cell disease, hypertension, end-stage renal disease on dialysis now admitted with history of syncopal episodes.  #1: End-stage renal disease: We will dialyze today with fluid removal as tolerated at bedside.  #2: Anemia: CT scan of the abdomen pelvis showed hemorrhage in the right kidney.  S/p vascular performing renal angiogram with coil embolization.  S/p 2 units of transfusions.  We will continue the protocols  erythropoietin  3.:  Hypertension: We will continue antihypertensive medications as ordered.  #4: Atrial fibrillation: Presently on amiodarone.  #5: Sepsis: Presently improved.  She is off of the Zosyn at this time.  #6: Secondary hyperparathyroidism: PTH,  phosphorus and calcium levels noted.  Will monitor closely.  We will follow.   LOS: [place], MD [facility] 11/4/202312:35 PM

## 2021-11-10 NOTE — Plan of Care (Signed)

## 2021-11-10 NOTE — Progress Notes (Signed)
Rounding Note    Patient Name: Teresa Cook Date of Encounter: 11/10/2021  [place] Cardiologist: None   Subjective   No acute events overnight. Abdomen remains tender. Has held sinus rhythm. Denies chest pain or shortness of breath.  Inpatient Medications    Scheduled Meds:  Chlorhexidine Gluconate Cloth  6 each Topical Q0600   feeding supplement (NEPRO CARB STEADY)  237 mL Oral TID BM   metoprolol tartrate  2.5 mg Intravenous Q6H   multivitamin  1 tablet Oral QHS   polyethylene glycol  17 g Oral Daily   senna-docusate  1 tablet Oral BID   Continuous Infusions:  sodium chloride 5 mL/hr at 11/10/21 0639   amiodarone 60 mg/hr (11/10/21 0639)   anticoagulant sodium citrate     piperacillin-tazobactam (ZOSYN)  IV Stopped (11/10/21 0629)   PRN Meds: acetaminophen, alteplase, anticoagulant sodium citrate, bisacodyl, diazepam, heparin, HYDROmorphone (DILAUDID) injection, lidocaine (PF), lidocaine-prilocaine, ondansetron (ZOFRAN) IV, mouth rinse, pentafluoroprop-tetrafluoroeth   Vital Signs    Vitals:   11/10/21 0400 11/10/21 0500 11/10/21 0700 11/10/21 0800  BP: 119/87   127/89  Pulse: 82  76 76  Resp: 20  13 (!) 22  Temp: 98.5 F (36.9 C)   99.6 F (37.6 C)  TempSrc: Oral   Oral  SpO2: 100%  100% 100%  Weight:  61.7 kg      Intake/Output Summary (Last 24 hours) at 11/10/2021 1059 Last data filed at 11/10/2021 2841 Gross per 24 hour  Intake 1018.23 ml  Output --  Net 1018.23 ml      11/10/2021    5:00 AM 11/09/2021    4:27 AM 11/08/2021    5:00 AM  Last 3 Weights  Weight (lbs) 136 lb 0.4 oz 131 lb 9.8 oz 136 lb 14.5 oz  Weight (kg) 61.7 kg 59.7 kg 62.1 kg      Telemetry    SR with occasional PAC/PVCs- Personally Reviewed  ECG    No new tracings - Personally Reviewed  Physical Exam   GEN: Well nourished, well developed in no acute distress NECK: No JVD CARDIAC: irregularly irregular rhythm, normal S1 and S2, no rubs or gallops. No  murmur. VASCULAR: Radial pulses 2+ bilaterally.  RESPIRATORY:  Clear to auscultation without rales, wheezing or rhonchi  ABDOMEN: Soft, non-distended, tender MUSCULOSKELETAL:  Moves all 4 limbs independently SKIN: Warm and dry, no edema NEUROLOGIC:  No focal neuro deficits noted. PSYCHIATRIC:  Normal affect    Labs    High Sensitivity Troponin:   Recent Labs  Lab 11/06/21 0950 11/06/21 1526 11/06/21 1743 11/09/21 1643 11/09/21 2041  TROPONINIHS '16 17 16 '$ 185* 172*     Chemistry Recent Labs  Lab 11/05/21 0320 11/06/21 0436 11/07/21 0512 11/08/21 0216 11/09/21 0515 11/10/21 0440  NA 141   < > 135 133* 135 131*  K 4.7   < > 5.2* 4.0 4.0 3.7  CL 107   < > 99 96* 93* 91*  CO2 19*   < > '24 28 29 27  '$ GLUCOSE 185*   < > 97 116* 106* 106*  BUN 65*   < > 63* 34* 25* 40*  CREATININE 10.38*   < > 10.10* 5.91* 4.88* 6.75*  CALCIUM 7.8*   < > 8.8* 9.3 9.5 10.0  MG  --    < > 1.9 1.7 2.2 2.2  PROT 5.1*  --   --   --   --   --   ALBUMIN 2.7*  --  2.8*  --   --   --  AST 19  --   --   --   --   --   ALT 11  --   --   --   --   --   ALKPHOS 58  --   --   --   --   --   BILITOT 0.5  --   --   --   --   --   GFRNONAA 4*   < > 4* 7* 9* 6*  ANIONGAP 15   < > '12 9 13 13   '$ < > = values in this interval not displayed.    Lipids No results for input(s): "CHOL", "TRIG", "HDL", "LABVLDL", "LDLCALC", "CHOLHDL" in the last 168 hours.  Hematology Recent Labs  Lab 11/09/21 0515 11/09/21 1254 11/09/21 1643 11/10/21 0440  WBC 11.3*  --  10.3 8.8  RBC 3.27*  --  3.29* 3.03*  HGB 9.2* 9.3* 9.2* 8.5*  HCT 26.6* 26.8* 26.8* 24.4*  MCV 81.3  --  81.5 80.5  MCH 28.1  --  28.0 28.1  MCHC 34.6  --  34.3 34.8  RDW 15.8*  --  16.0* 16.0*  PLT 122*  --  145* 144*   Thyroid  Recent Labs  Lab 11/05/21 0658 11/06/21 1526  TSH 3.354 2.080  FREET4 0.84  --     BNP Recent Labs  Lab 11/05/21 0320  BNP 128.8*    DDimer  Recent Labs  Lab 11/06/21 0950  DDIMER 16.58*     Radiology     DG Abd 1 View  Result Date: 11/09/2021 CLINICAL DATA:  Nausea and vomit. EXAM: ABDOMEN - 1 VIEW COMPARISON:  CT 11/06/2021 FINDINGS: New gaseous gastric distension. There is air throughout prominent small and large bowel. Nonobstructive pattern. No evidence of free air. Coils in the left upper quadrant again seen. Diminishing gallbladder distension, high-density gallbladder contents again seen. Left femoral catheter in place. IMPRESSION: New gaseous gastric distension with air throughout prominent small and large bowel. Overall findings suggestive of generalized ileus. Electronically Signed   By: Keith Rake M.D.   On: 11/09/2021 17:22   US Abdomen Limited RUQ (LIVER/GB)  Result Date: 11/08/2021 CLINICAL DATA:  Abdomen pain EXAM: ULTRASOUND ABDOMEN LIMITED RIGHT UPPER QUADRANT COMPARISON:  CT 11/06/2021, 11/05/2021 FINDINGS: Gallbladder: No gallstones or wall thickening visualized. No sonographic Murphy sign noted by sonographer. Common bile duct: Diameter: 3.1 mm Liver: No focal lesion identified. Within normal limits in parenchymal echogenicity. Portal vein is patent on color Doppler imaging with normal direction of blood flow towards the liver. Other: Small free fluid adjacent to the inferior margin of right liver. Heterogenous complex fluid collection in the right flank measuring 11.2 x 6.6 x 13.2 cm, corresponding to known retroperitoneal hematoma. Heterogenous appearing echogenic right kidney, likely due to chronic kidney disease and hematoma previously demonstrated on CT. IMPRESSION: 1. Negative for gallstones or biliary dilatation. 2. Large heterogenous complex fluid collection in the right flank measuring up to 13.2 cm, felt to correspond to CT demonstrated retroperitoneal hematoma. 3. Small free fluid adjacent to the inferior margin of right liver. 4. Heterogenous appearing right kidney, likely due to chronic kidney disease and hematoma previously demonstrated on CT. Electronically Signed    By: Donavan Foil M.D.   On: 11/08/2021 16:25    Cardiac Studies   2D echo 11/06/2021: 1. Left ventricular ejection fraction, by estimation, is 50 to 55%. The  left ventricle has low normal function. The left ventricle has no regional  wall motion  abnormalities. There is mild concentric left ventricular  hypertrophy. Left ventricular  diastolic parameters are consistent with Grade I diastolic dysfunction  (impaired relaxation).   2. Right ventricular systolic function is normal. The right ventricular  size is normal.   3. The mitral valve is normal in structure. Trivial mitral valve  regurgitation.   4. The aortic valve is calcified. Aortic valve regurgitation is not  visualized. Aortic valve sclerosis/calcification is present, without any  evidence of aortic stenosis.   Patient Profile     66 y.o. female with a history of ESRD on hemodialysis, HFpEF, PAF on warfarin, DVT, cerebral meningioma, depression, and breast cancer, who is being seen and evaluated for A-fib RVR and NSVT.  Assessment & Plan    Atrial fibrillation with RVR -currently in sinus -remains on amiodarone drip, not ideal given inability to anticoagulate but limited other options given clinical picture -holding on anticoagulation, Hgb down to 8.5 today from 9.2 yesterday. Would challenge with IV heparin once considered stable from CC/vascular surgery perspective -CHA2DS2/VAS Stroke Risk Points=5  -transition to oral amiodarone prior to discharge -recommend keeping potassium 4 and mg 2 -continue with cardiac monitoring  NSVT -asymptomatic -continued on IV amiodarone -continue telemetry  HFpEF -volume management per hemodialysis -LVEF 50-55%, NRWMA, G1DD, and trivial MR -remain cautious with fluids -daily weight and I&O's  Hemorrhagic shock secondary to right renal hemorrhage with hypovolemic syncope and acute blood loss anemia in the setting of supratherapeutic INR -hgb 9.2-> 8.5 today -vasopressors  stopped 11/3 at 0350 -s/p right renal artery embolization on 11/05/21 -management per primary team  ESRD on HD -management per Nephrology  For questions or updates, please contact [place] Please consult [email or web address] for contact info under        Signed, Buford Dresser, MD  11/10/2021, 10:59 AM

## 2021-11-10 NOTE — Consult Note (Signed)
PHARMACY CONSULT NOTE  Pharmacy Consult for Electrolyte Monitoring and Replacement   Recent Labs: Potassium (mmol/L)  Date Value  11/10/2021 3.7  02/03/2014 4.0   Magnesium (mg/dL)  Date Value  11/10/2021 2.2   Calcium (mg/dL)  Date Value  11/10/2021 10.0   Calcium, Total (mg/dL)  Date Value  02/03/2014 8.6   Albumin (g/dL)  Date Value  11/07/2021 2.8 (L)  02/02/2014 3.5   Phosphorus (mg/dL)  Date Value  11/10/2021 6.3 (H)  01/17/2014 4.9   Sodium (mmol/L)  Date Value  11/10/2021 131 (L)  02/29/2020 140  02/03/2014 138   Corrected Calcium: 9.3 mg/dL  Assessment: Patient is a 66 y/o F with medical history including depression / anxiety, DVT, 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 Patient subsequently found to have severe acute right renal hemorrhage in setting of supratherapeutic INR on warfarin. Patient ordered emergent reversal with Kcentra and vitamin K. Pharmacy consulted to assist with electrolyte monitoring and replacement as indicated.  Hyperphosphatemia d/t renal osteodystrophy- managed outpatient.   Goal of Therapy:  Within normal limits  Plan:  --No replacement warranted  (HD was held 11/3 d/t had Vtach after HD on 11/2) --Follow-up electrolytes with AM labs tomorrow  Teresa Cook A 11/10/2021 9:56 AM

## 2021-11-10 NOTE — Progress Notes (Addendum)
PROGRESS NOTE    Teresa Cook  CXK:[id number] DOB: 1955/10/26 DOA: 11/05/2021 [facility]CP: [facility]    Brief Narrative:  66 y.o female with PMH significant for ESRD on HD, A. Fib & DVT on Warfarin , admitted with Hemorrhagic shock due to right renal hemorrhage with large hematoma in setting of supratherapeutic INR (INR >9).  Vascular Surgery consulted, required coil embolization of the right renal artery.    66 year old female with a past medical history significant for ESRD on hemodialysis with chronic PermCath, CHF, DVT, cerebral meningioma who presents to [facility] ED on 11/05/2021 status post fall and loss of consciousness.  Patient reports she lives alone and passed out at least 4 times overnight, and when going to the bathroom around 5 AM this morning,  she passed out again it and thinks she hit her head.  Following the fall she also reports development of back/flank pain that radiates around to the right lower quadrant.   She reports she has been compliant with her hemodialysis, last session on Friday.  She denies chest pain, shortness of breath.  She reports compliance with her warfarin for anticoagulation.  10/30: Presented to ED, found to have hemorrhagic shock due to right renal hemorrhage.  Coumadin reversed with Kcentra and Vitamin K.  2 units of FFP and 2 unit of pRBCs administered.  Vascular Surgery performed embolization of right renal artery 10/31: Weaned off pressors, lactic normal. New thrombocytopenia, DIC workup negative.  Continues to complain of significant right sided abdominal/flank pain, will repeat CT Abdomen & Pelvis.  Transfuse 1 unit pRBCs for Hgb 6.5 10/31: [place] Bilateral Carotid revealed right carotid artery        system: Less than 50% stenosis secondary to mild multifocal        atherosclerotic plaque formation. Left carotid artery system:        Less than 50% stenosis secondary to mild multifocal        atherosclerotic plaque formation.  Vertebral artery system:        Patent with antegrade flow bilaterally 11/1: Pt remains on amiodarone gtt and precedex gtt.  States pain is better controlled.  In sinus rhythm rate controlled.  During HD session pt developed atrial fibrillation with rvr and hypotension requiring neo-synephrine gtt.  Required 1 unit pRBC due to hgb 6.9 post transfusion hgb 8.0 no signs of bleeding  11/2: Pt remains on amiodarone gtt, neo-synephrine gtt '@20'$  mcg/min, and precedex gtt '@0'$ .7 mcg/kg/hr.   11/3: DNR/DNI status, off pressors 11/4: Care assumed by [facility] hospitalist service.  Neo-Synephrine and Precedex weaned off.  Feels better this morning.   Assessment & Plan:   Principal Problem:   Hypotension Active Problems:   End-stage renal disease on hemodialysis (HCC)   PAF (paroxysmal atrial fibrillation) (HCC)   Anemia due to acute blood loss   Shock (HCC)   Right flank pain   Retroperitoneal bleed   Altered mental status   Elevated lactic acid level   Atrial fibrillation with rapid ventricular response (HCC)  Hemorrhagic shock in the setting of right renal hemorrhage Supratherapeutic INR status post Coumadin reversal agent Acute blood loss anemia Patient required coil embolization of right renal artery by vascular surgery.  Hemoglobin continues to drift but overall stable.  INR 1.5.  Shock resolved.  Neo-Synephrine discontinued. Plan: Trend hemoglobin and monitor for any bleeding Hold Coumadin Cardiology may consider IV heparin challenge, defer to them  Ileus Patient had worsening of abdominal distention noted on 11/3.  KUB revealed  gaseous distention consistent with ileus.  No signs of obstruction or free air under the diaphragm.  Patient has a relatively soft abdomen and good bowel sounds.  Will initiate bowel regimen.  Mobilize as tolerated.  Assess for response.  Atrial tachycardia/PNSVT/atrial fibrillation with rapid ventricular response Patient has been difficult to rate control She is  currently on amiodarone gtt. Plan: Continue Amio gtt. Defer to cardiology regarding timing of transition to oral amio Consider IV heparin challenge as patient is on amnio gtt. without anticoagulation  ESRD on HD Nephrology following for inpatient hemodialysis needs  Heart failure with preserved ejection fraction LVEF 50 to 95%, grade 1 diastolic dysfunction Nephrology for fluid removal Daily weights, strict I's and O's    DVT prophylaxis: SCD Code Status: DNR Family Communication: None today Disposition Plan: Status is: Inpatient Remains inpatient appropriate because: Multiple acute issues requiring continued hospitalization   Level of care: Progressive  Consultants:  Cardiology Palliative care  Procedures:  Coil embolization  Antimicrobials: Zosyn  Subjective: Seen and examined.  Lethargic but more alert this morning.  Endorses she feels better.  Objective: Vitals:   11/10/21 0900 11/10/21 1000 11/10/21 1300 11/10/21 1347  BP:  103/67    Pulse: 96 85 84 97  Resp: 19 16 (!) 25 (!) 28  Temp:    98.7 F (37.1 C)  TempSrc:    Oral  SpO2: 92% 100% 92% 93%  Weight:        Intake/Output Summary (Last 24 hours) at 11/10/2021 1417 Last data filed at 11/10/2021 0639 Gross per 24 hour  Intake 1018.23 ml  Output --  Net 1018.23 ml   Filed Weights   11/08/21 0500 11/09/21 0427 11/10/21 0500  Weight: 62.1 kg 59.7 kg 61.7 kg    Examination: ` General exam: NAD.  Ill-appearing Respiratory system: Bibasilar crackles.  Normal work of breathing.  2 L Cardiovascular system: S1-S2, RRR, no murmurs, no pedal edema Gastrointestinal system: Soft, nondistended, mild TTP, normal bowel sounds. Central nervous system: Alert and oriented. No focal neurological deficits. Extremities: Symmetrically decreased power bilaterally Skin: No rashes, lesions or ulcers Psychiatry: Judgement and insight appear normal. Mood & affect flattened.     Data Reviewed: I have personally  reviewed following labs and imaging studies  CBC: Recent Labs  Lab 11/07/21 0512 11/07/21 0612 11/08/21 0216 11/08/21 1401 11/09/21 0004 11/09/21 0515 11/09/21 1254 11/09/21 1643 11/10/21 0440  WBC 14.4*  --  12.6*  --   --  11.3*  --  10.3 8.8  NEUTROABS 11.8*  --  10.4*  --   --  9.2*  --  8.2* 6.5  HGB 6.9*   < > 8.0*   < > 8.5* 9.2* 9.3* 9.2* 8.5*  HCT 20.3*   < > 23.0*   < > 24.6* 26.6* 26.8* 26.8* 24.4*  MCV 82.9  --  82.1  --   --  81.3  --  81.5 80.5  PLT 81*  --  89*  --   --  122*  --  145* 144*   < > = values in this interval not displayed.   Basic Metabolic Panel: Recent Labs  Lab 11/06/21 0436 11/06/21 1526 11/07/21 0512 11/08/21 0216 11/09/21 0515 11/10/21 0440  NA 138  --  135 133* 135 131*  K 4.6 4.7 5.2* 4.0 4.0 3.7  CL 100  --  99 96* 93* 91*  CO2 26  --  '24 28 29 27  '$ GLUCOSE 105*  --  97 116* 106*  106*  BUN 49*  --  63* 34* 25* 40*  CREATININE 8.25*  --  10.10* 5.91* 4.88* 6.75*  CALCIUM 8.3*  --  8.8* 9.3 9.5 10.0  MG 1.8 1.7 1.9 1.7 2.2 2.2  PHOS 7.1*  --  8.6* 5.0* 5.4* 6.3*   GFR: Estimated Creatinine Clearance: 7.3 mL/min (A) (by C-G formula based on SCr of 6.75 mg/dL (H)). Liver Function Tests: Recent Labs  Lab 11/05/21 0320 11/07/21 0512  AST 19  --   ALT 11  --   ALKPHOS 58  --   BILITOT 0.5  --   PROT 5.1*  --   ALBUMIN 2.7* 2.8*   No results for input(s): "LIPASE", "AMYLASE" in the last 168 hours. No results for input(s): "AMMONIA" in the last 168 hours. Coagulation Profile: Recent Labs  Lab 11/05/21 0450 11/05/21 1003 11/06/21 0436 11/07/21 0512 11/08/21 1838  INR 9.2* 1.4* 1.4* 1.5* 1.5*   Cardiac Enzymes: No results for input(s): "CKTOTAL", "CKMB", "CKMBINDEX", "TROPONINI" in the last 168 hours. BNP (last 3 results) No results for input(s): "PROBNP" in the last 8760 hours. HbA1C: No results for input(s): "HGBA1C" in the last 72 hours. CBG: Recent Labs  Lab 11/09/21 1931 11/09/21 2324 11/10/21 0333  11/10/21 0748 11/10/21 1141  GLUCAP 113* 117* 108* 89 108*   Lipid Profile: No results for input(s): "CHOL", "HDL", "LDLCALC", "TRIG", "CHOLHDL", "LDLDIRECT" in the last 72 hours. Thyroid Function Tests: No results for input(s): "TSH", "T4TOTAL", "FREET4", "T3FREE", "THYROIDAB" in the last 72 hours. Anemia Panel: No results for input(s): "VITAMINB12", "FOLATE", "FERRITIN", "TIBC", "IRON", "RETICCTPCT" in the last 72 hours. Sepsis Labs: Recent Labs  Lab 11/05/21 1003 11/05/21 1322 11/05/21 2050 11/06/21 0436 11/06/21 0821 11/10/21 0817  PROCALCITON  --   --   --  2.08  --  3.96  LATICACIDVEN 4.6* 1.5 1.2  --  1.2  --     Recent Results (from the past 240 hour(s))  Blood Culture (routine x 2)     Status: None   Collection Time: 11/05/21  3:20 AM   Specimen: BLOOD  Result Value Ref Range Status   Specimen Description BLOOD RIGHT FA  Final   Special Requests   Final    BOTTLES DRAWN AEROBIC AND ANAEROBIC Blood Culture results may not be optimal due to an inadequate volume of blood received in culture bottles   Culture   Final    NO GROWTH 5 DAYS Performed at [facility], [address] [place], [place] [address]    Report Status 11/10/2021 FINAL  Final  SARS Coronavirus 2 by RT PCR (hospital order, performed in [facility] hospital lab) *cepheid single result test* Anterior Nasal Swab     Status: None   Collection Time: 11/05/21  3:20 AM   Specimen: Anterior Nasal Swab  Result Value Ref Range Status   SARS Coronavirus 2 by RT PCR NEGATIVE NEGATIVE Final    Comment: (NOTE) SARS-CoV-2 target nucleic acids are NOT DETECTED.  The SARS-CoV-2 RNA is generally detectable in upper and lower respiratory specimens during the acute phase of infection. The lowest concentration of SARS-CoV-2 viral copies this assay can detect is 250 copies / mL. A negative result does not preclude SARS-CoV-2 infection and should not be used as the sole basis for treatment or other patient  management decisions.  A negative result may occur with improper specimen collection / handling, submission of specimen other than nasopharyngeal swab, presence of viral mutation(s) within the areas targeted by this assay,  and inadequate number of viral copies (<250 copies / mL). A negative result must be combined with clinical observations, patient history, and epidemiological information.  Fact Sheet for Patients:   https://www.patel.info/  Fact Sheet for Healthcare Providers: https://hall.com/  This test is not yet approved or  cleared by the [place] FDA and has been authorized for detection and/or diagnosis of SARS-CoV-2 by FDA under an Emergency Use Authorization (EUA).  This EUA will remain in effect (meaning this test can be used) for the duration of the COVID-19 declaration under Section 564(b)(1) of the Act, 21 U.S.C. section 360bbb-3(b)(1), unless the authorization is terminated or revoked sooner.  Performed at [facility], [address] [place], [place] [address]   Blood Culture (routine x 2)     Status: None   Collection Time: 11/05/21  3:21 AM   Specimen: BLOOD  Result Value Ref Range Status   Specimen Description BLOOD RIGHT HAND  Final   Special Requests   Final    BOTTLES DRAWN AEROBIC AND ANAEROBIC Blood Culture results may not be optimal due to an inadequate volume of blood received in culture bottles   Culture   Final    NO GROWTH 5 DAYS Performed at [facility], [address] [place], [place] [address]    Report Status 11/10/2021 FINAL  Final  MRSA Next Gen by PCR, Nasal     Status: None   Collection Time: 11/05/21  6:57 AM   Specimen: Nasal Mucosa; Nasal Swab  Result Value Ref Range Status   MRSA by PCR Next Gen NOT DETECTED NOT DETECTED Final    Comment: (NOTE) The GeneXpert MRSA Assay (FDA approved for NASAL specimens only), is one component of a comprehensive MRSA colonization  surveillance program. It is not intended to diagnose MRSA infection nor to guide or monitor treatment for MRSA infections. Test performance is not FDA approved in patients less than 39 years old. Performed at [facility], [address], [place], [place] [address]          Radiology Studies: DG Abd 1 View  Result Date: 11/09/2021 CLINICAL DATA:  Nausea and vomit. EXAM: ABDOMEN - 1 VIEW COMPARISON:  CT 11/06/2021 FINDINGS: New gaseous gastric distension. There is air throughout prominent small and large bowel. Nonobstructive pattern. No evidence of free air. Coils in the left upper quadrant again seen. Diminishing gallbladder distension, high-density gallbladder contents again seen. Left femoral catheter in place. IMPRESSION: New gaseous gastric distension with air throughout prominent small and large bowel. Overall findings suggestive of generalized ileus. Electronically Signed   By: Keith Rake M.D.   On: 11/09/2021 17:22   US Abdomen Limited RUQ (LIVER/GB)  Result Date: 11/08/2021 CLINICAL DATA:  Abdomen pain EXAM: ULTRASOUND ABDOMEN LIMITED RIGHT UPPER QUADRANT COMPARISON:  CT 11/06/2021, 11/05/2021 FINDINGS: Gallbladder: No gallstones or wall thickening visualized. No sonographic Murphy sign noted by sonographer. Common bile duct: Diameter: 3.1 mm Liver: No focal lesion identified. Within normal limits in parenchymal echogenicity. Portal vein is patent on color Doppler imaging with normal direction of blood flow towards the liver. Other: Small free fluid adjacent to the inferior margin of right liver. Heterogenous complex fluid collection in the right flank measuring 11.2 x 6.6 x 13.2 cm, corresponding to known retroperitoneal hematoma. Heterogenous appearing echogenic right kidney, likely due to chronic kidney disease and hematoma previously demonstrated on CT. IMPRESSION: 1. Negative for gallstones or biliary dilatation. 2. Large heterogenous complex fluid collection in the  right flank measuring up  to 13.2 cm, felt to correspond to CT demonstrated retroperitoneal hematoma. 3. Small free fluid adjacent to the inferior margin of right liver. 4. Heterogenous appearing right kidney, likely due to chronic kidney disease and hematoma previously demonstrated on CT. Electronically Signed   By: Donavan Foil M.D.   On: 11/08/2021 16:25        Scheduled Meds:  Chlorhexidine Gluconate Cloth  6 each Topical Q0600   feeding supplement (NEPRO CARB STEADY)  237 mL Oral TID BM   metoprolol tartrate  2.5 mg Intravenous Q6H   multivitamin  1 tablet Oral QHS   polyethylene glycol  17 g Oral Daily   senna-docusate  1 tablet Oral BID   Continuous Infusions:  sodium chloride 5 mL/hr at 11/10/21 0639   amiodarone 60 mg/hr (11/10/21 1107)   anticoagulant sodium citrate     piperacillin-tazobactam (ZOSYN)  IV 2.25 g (11/10/21 1349)     LOS: 5 days      Sidney Ace, MD [facility]   If 7PM-7AM, please contact night-coverage  11/10/2021, 2:17 PM

## 2021-11-11 DIAGNOSIS — I480 Paroxysmal atrial fibrillation: Secondary | ICD-10-CM | POA: Diagnosis not present

## 2021-11-11 DIAGNOSIS — R579 Shock, unspecified: Secondary | ICD-10-CM | POA: Diagnosis not present

## 2021-11-11 DIAGNOSIS — R58 Hemorrhage, not elsewhere classified: Secondary | ICD-10-CM | POA: Diagnosis not present

## 2021-11-11 DIAGNOSIS — D62 Acute posthemorrhagic anemia: Secondary | ICD-10-CM | POA: Diagnosis not present

## 2021-11-11 LAB — CBC WITH DIFFERENTIAL/PLATELET
Abs Immature Granulocytes: 0.07 10*3/uL (ref 0.00–0.07)
Basophils Absolute: 0 10*3/uL (ref 0.0–0.1)
Basophils Relative: 0 %
Eosinophils Absolute: 0.3 10*3/uL (ref 0.0–0.5)
Eosinophils Relative: 5 %
HCT: 22.9 % — ABNORMAL LOW (ref 36.0–46.0)
Hemoglobin: 8.2 g/dL — ABNORMAL LOW (ref 12.0–15.0)
Immature Granulocytes: 1 %
Lymphocytes Relative: 10 %
Lymphs Abs: 0.6 10*3/uL — ABNORMAL LOW (ref 0.7–4.0)
MCH: 28.2 pg (ref 26.0–34.0)
MCHC: 35.8 g/dL (ref 30.0–36.0)
MCV: 78.7 fL — ABNORMAL LOW (ref 80.0–100.0)
Monocytes Absolute: 1 10*3/uL (ref 0.1–1.0)
Monocytes Relative: 15 %
Neutro Abs: 4.6 10*3/uL (ref 1.7–7.7)
Neutrophils Relative %: 69 %
Platelets: 147 10*3/uL — ABNORMAL LOW (ref 150–400)
RBC: 2.91 MIL/uL — ABNORMAL LOW (ref 3.87–5.11)
RDW: 15.8 % — ABNORMAL HIGH (ref 11.5–15.5)
WBC: 6.6 10*3/uL (ref 4.0–10.5)
nRBC: 0 % (ref 0.0–0.2)

## 2021-11-11 LAB — GLUCOSE, CAPILLARY
Glucose-Capillary: 92 mg/dL (ref 70–99)
Glucose-Capillary: 86 mg/dL (ref 70–99)
Glucose-Capillary: 93 mg/dL (ref 70–99)
Glucose-Capillary: 113 mg/dL — ABNORMAL HIGH (ref 70–99)
Glucose-Capillary: 106 mg/dL — ABNORMAL HIGH (ref 70–99)

## 2021-11-11 LAB — PHOSPHORUS: Phosphorus: 4.3 mg/dL (ref 2.5–4.6)

## 2021-11-11 LAB — POTASSIUM: Potassium: 3.4 mmol/L — ABNORMAL LOW (ref 3.5–5.1)

## 2021-11-11 LAB — MAGNESIUM: Magnesium: 1.9 mg/dL (ref 1.7–2.4)

## 2021-11-11 MED ORDER — POTASSIUM CHLORIDE CRYS ER 20 MEQ PO TBCR
20.0000 meq | EXTENDED_RELEASE_TABLET | Freq: Once | ORAL | Status: AC
  Administered 2021-11-11: 20 meq via ORAL
  Filled 2021-11-11: qty 1

## 2021-11-11 MED ORDER — AMIODARONE HCL 200 MG PO TABS
200.0000 mg | ORAL_TABLET | Freq: Two times a day (BID) | ORAL | Status: DC
  Administered 2021-11-11 – 2021-11-22 (×23): 200 mg via ORAL
  Filled 2021-11-11 (×22): qty 1

## 2021-11-11 NOTE — Consult Note (Signed)
PHARMACY CONSULT NOTE  Pharmacy Consult for Electrolyte Monitoring and Replacement   Recent Labs: Potassium (mmol/L)  Date Value  11/11/2021 3.4 (L)  02/03/2014 4.0   Magnesium (mg/dL)  Date Value  11/11/2021 1.9   Calcium (mg/dL)  Date Value  11/10/2021 10.0   Calcium, Total (mg/dL)  Date Value  02/03/2014 8.6   Albumin (g/dL)  Date Value  11/07/2021 2.8 (L)  02/02/2014 3.5   Phosphorus (mg/dL)  Date Value  11/11/2021 4.3  01/17/2014 4.9   Sodium (mmol/L)  Date Value  11/10/2021 131 (L)  02/29/2020 140  02/03/2014 138   Corrected Calcium: 9.3 mg/dL  Assessment: Patient is a 66 y/o F with medical history including depression / anxiety, DVT, 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 Patient subsequently found to have severe acute right renal hemorrhage in setting of supratherapeutic INR on warfarin. Patient ordered emergent reversal with Kcentra and vitamin K. Pharmacy consulted to assist with electrolyte monitoring and replacement as indicated.  Hyperphosphatemia d/t renal osteodystrophy- managed outpatient.   Goal of Therapy:  Within normal limits  Plan:  --  (HD was held 11/3 d/t had Vtach after HD on 11/2)- received HD on 11/4 -- K 3.4 will order KCL 38mq po x1 --Follow-up electrolytes with AM labs tomorrow  [place] A 11/11/2021 7:26 AM

## 2021-11-11 NOTE — Progress Notes (Signed)
[facility]  PROGRESS NOTE   Subjective:   Feels much better today.  Had stable dialysis yesterday.  Objective:  Vital signs: Blood pressure (!) 142/95, pulse 83, temperature 99.9 F (37.7 C), temperature source Oral, resp. rate (!) 23, weight 61.9 kg, SpO2 100 %.  Intake/Output Summary (Last 24 hours) at 11/11/2021 1206 Last data filed at 11/11/2021 0742 Gross per 24 hour  Intake 1049.01 ml  Output 1 ml  Net 1048.01 ml   Filed Weights   11/09/21 0427 11/10/21 0500 11/10/21 1433  Weight: 59.7 kg 61.7 kg 61.9 kg     Physical Exam: General:  No acute distress  Head:  Normocephalic, atraumatic. Moist oral mucosal membranes  Eyes:  Anicteric  Neck:  Supple  Lungs:   Clear to auscultation, normal effort  Heart:  S1S2 no rubs  Abdomen:   Soft, nontender, bowel sounds present  Extremities:  peripheral edema.  Neurologic:  Awake, alert, following commands  Skin:  No lesions  Access:     Basic Metabolic Panel: Recent Labs  Lab 11/06/21 0436 11/06/21 1526 11/07/21 0512 11/08/21 0216 11/09/21 0515 11/10/21 0440 11/11/21 0528  NA 138  --  135 133* 135 131*  --   K 4.6   < > 5.2* 4.0 4.0 3.7 3.4*  CL 100  --  99 96* 93* 91*  --   CO2 26  --  '24 28 29 27  '$ --   GLUCOSE 105*  --  97 116* 106* 106*  --   BUN 49*  --  63* 34* 25* 40*  --   CREATININE 8.25*  --  10.10* 5.91* 4.88* 6.75*  --   CALCIUM 8.3*  --  8.8* 9.3 9.5 10.0  --   MG 1.8   < > 1.9 1.7 2.2 2.2 1.9  PHOS 7.1*  --  8.6* 5.0* 5.4* 6.3* 4.3   < > = values in this interval not displayed.    CBC: Recent Labs  Lab 11/08/21 0216 11/08/21 1401 11/09/21 0515 11/09/21 1254 11/09/21 1643 11/10/21 0440 11/11/21 0528  WBC 12.6*  --  11.3*  --  10.3 8.8 6.6  NEUTROABS 10.4*  --  9.2*  --  8.2* 6.5 4.6  HGB 8.0*   < > 9.2* 9.3* 9.2* 8.5* 8.2*  HCT 23.0*   < > 26.6* 26.8* 26.8* 24.4* 22.9*  MCV 82.1  --  81.3  --  81.5 80.5 78.7*  PLT 89*  --  122*  --  145* 144* 147*   < > = values in this  interval not displayed.     Urinalysis: No results for input(s): "COLORURINE", "LABSPEC", "PHURINE", "GLUCOSEU", "HGBUR", "BILIRUBINUR", "KETONESUR", "PROTEINUR", "UROBILINOGEN", "NITRITE", "LEUKOCYTESUR" in the last 72 hours.  Invalid input(s): "APPERANCEUR"    Imaging: DG Abd 1 View  Result Date: 11/09/2021 CLINICAL DATA:  Nausea and vomit. EXAM: ABDOMEN - 1 VIEW COMPARISON:  CT 11/06/2021 FINDINGS: New gaseous gastric distension. There is air throughout prominent small and large bowel. Nonobstructive pattern. No evidence of free air. Coils in the left upper quadrant again seen. Diminishing gallbladder distension, high-density gallbladder contents again seen. Left femoral catheter in place. IMPRESSION: New gaseous gastric distension with air throughout prominent small and large bowel. Overall findings suggestive of generalized ileus. Electronically Signed   By: Keith Rake M.D.   On: 11/09/2021 17:22     Medications:    sodium chloride Stopped (11/10/21 1349)   amiodarone 60 mg/hr (11/11/21 0954)   anticoagulant sodium citrate  piperacillin-tazobactam (ZOSYN)  IV Stopped (11/11/21 0549)    Chlorhexidine Gluconate Cloth  6 each Topical Q0600   feeding supplement (NEPRO CARB STEADY)  237 mL Oral TID BM   metoprolol tartrate  2.5 mg Intravenous Q6H   multivitamin  1 tablet Oral QHS   polyethylene glycol  17 g Oral Daily   senna-docusate  1 tablet Oral BID    Assessment/ Plan:     Principal Problem:   Shock ([place]) Active Problems:   Acute respiratory failure with hypoxia (HCC)   ESRD on dialysis ([place])   HTN (hypertension)   End-stage renal disease on hemodialysis (HCC)   PAF (paroxysmal atrial fibrillation) (HCC)   Hypotension   Anemia due to acute blood loss   Right flank pain   Retroperitoneal bleed   Altered mental status   Elevated lactic acid level   Atrial fibrillation with rapid ventricular response ([place])  66 year old female with history of hypertension,  gout, anxiety with depression, sickle cell disease, hypertension, end-stage renal disease on dialysis now admitted with history of syncopal episodes.   #1: End-stage renal disease: Had stable dialysis yesterday.   #2: Anemia: CT scan of the abdomen pelvis showed hemorrhage in the right kidney.  S/p vascular performing renal angiogram with coil embolization.  S/p 2 units of transfusions.  We will continue the protocols with erythropoietin.   3.: Hypertension: We will continue antihypertensive medications as ordered.   #4: Atrial fibrillation: Presently on amiodarone.   #5: Sepsis: Presently improved.  She is off of the Zosyn at this time.   #6: Secondary hyperparathyroidism: PTH, phosphorus and calcium levels noted.  Will monitor closely.   She has some concerns regarding her dialysis treatments.  Answered all her questions to her satisfaction.  We will follow.   LOS: [place], [facility] 11/5/202312:06 PM

## 2021-11-11 NOTE — Progress Notes (Signed)
PROGRESS NOTE    Teresa Cook  WJX:[id number] DOB: 18-Jan-1955 DOA: 11/05/2021 PCP: [facility] [place]    Brief Narrative:  66 y.o female with PMH significant for ESRD on HD, A. Fib & DVT on Warfarin , admitted with Hemorrhagic shock due to right renal hemorrhage with large hematoma in setting of supratherapeutic INR (INR >9).  Vascular Surgery consulted, required coil embolization of the right renal artery.    66 year old female with a past medical history significant for ESRD on hemodialysis with chronic PermCath, CHF, DVT, cerebral meningioma who presents to [facility] ED on 11/05/2021 status post fall and loss of consciousness.  Patient reports she lives alone and passed out at least 4 times overnight, and when going to the bathroom around 5 AM this morning,  she passed out again it and thinks she hit her head.  Following the fall she also reports development of back/flank pain that radiates around to the right lower quadrant.   She reports she has been compliant with her hemodialysis, last session on Friday.  She denies chest pain, shortness of breath.  She reports compliance with her warfarin for anticoagulation.  10/30: Presented to ED, found to have hemorrhagic shock due to right renal hemorrhage.  Coumadin reversed with Kcentra and Vitamin K.  2 units of FFP and 2 unit of pRBCs administered.  Vascular Surgery performed embolization of right renal artery 10/31: Weaned off pressors, lactic normal. New thrombocytopenia, DIC workup negative.  Continues to complain of significant right sided abdominal/flank pain, will repeat CT Abdomen & Pelvis.  Transfuse 1 unit pRBCs for Hgb 6.5 10/31: [place] Bilateral Carotid revealed right carotid artery        system: Less than 50% stenosis secondary to mild multifocal        atherosclerotic plaque formation. Left carotid artery system:        Less than 50% stenosis secondary to mild multifocal        atherosclerotic plaque formation.  Vertebral artery system:        Patent with antegrade flow bilaterally 11/1: Pt remains on amiodarone gtt and precedex gtt.  States pain is better controlled.  In sinus rhythm rate controlled.  During HD session pt developed atrial fibrillation with rvr and hypotension requiring neo-synephrine gtt.  Required 1 unit pRBC due to hgb 6.9 post transfusion hgb 8.0 no signs of bleeding  11/2: Pt remains on amiodarone gtt, neo-synephrine gtt '@20'$  mcg/min, and precedex gtt '@0'$ .7 mcg/kg/hr.   11/3: DNR/DNI status, off pressors 11/4: Care assumed by [facility] hospitalist service.  Neo-Synephrine and Precedex weaned off.  Feels better this morning. 11/5: No acute events overnight.  Patient feels well this morning.   Assessment & Plan:   Principal Problem:   Shock ([place]) Active Problems:   Acute respiratory failure with hypoxia ([place])   ESRD on dialysis ([place])   End-stage renal disease on hemodialysis (HCC)   HTN (hypertension)   PAF (paroxysmal atrial fibrillation) (HCC)   Hypotension   Anemia due to acute blood loss   Right flank pain   Retroperitoneal bleed   Altered mental status   Elevated lactic acid level   Atrial fibrillation with rapid ventricular response (HCC)  Hemorrhagic shock in the setting of right renal hemorrhage Supratherapeutic INR status post Coumadin reversal agent Acute blood loss anemia Patient required coil embolization of right renal artery by vascular surgery.  Hemoglobin continues to drift but overall stable.  INR 1.5.  Shock resolved.  Neo-Synephrine discontinued. Hemoglobin drifting down Plan:  Trend hemoglobin and monitor for any bleeding Hemoglobin drifting down but no signs of acute bleeding Hold Coumadin Cardiology may consider IV heparin challenge, defer to them regarding timing  Ileus Patient had worsening of abdominal distention noted on 11/3.  KUB revealed gaseous distention consistent with ileus.  No signs of obstruction or free air under the diaphragm.  Patient  has a relatively soft abdomen and good bowel sounds.  Continue bowel regimen.  Mobilize as tolerated  Atrial tachycardia/PNSVT/atrial fibrillation with rapid ventricular response Patient has been difficult to rate control She is currently on amiodarone gtt. Plan: Continue Amio gtt. Defer to cardiology regarding timing of transition to oral amio Consider IV heparin challenge as patient is on amnio gtt. without anticoagulation  ESRD on HD Nephrology following for inpatient hemodialysis needs  Heart failure with preserved ejection fraction LVEF 50 to 51%, grade 1 diastolic dysfunction Nephrology for fluid removal Daily weights, strict I's and O's    DVT prophylaxis: SCD Code Status: DNR Family Communication: Daughter via phone 11/5 Disposition Plan: Status is: Inpatient Remains inpatient appropriate because: Multiple acute issues requiring continued hospitalization   Level of care: Progressive  Consultants:  Cardiology Palliative care  Procedures:  Coil embolization  Antimicrobials:  Subjective: Seen and examined.  Feeling better this morning.  Objective: Vitals:   11/11/21 0742 11/11/21 0800 11/11/21 1000 11/11/21 1200  BP: 127/82 134/87  (!) 142/95  Pulse: 90 76 91 83  Resp: 17 15 (!) 25 (!) 23  Temp: 99.9 F (37.7 C)     TempSrc: Oral     SpO2: (!) 89% 100% (!) 88% 100%  Weight:        Intake/Output Summary (Last 24 hours) at 11/11/2021 1406 Last data filed at 11/11/2021 0742 Gross per 24 hour  Intake 1049.01 ml  Output 1 ml  Net 1048.01 ml   Filed Weights   11/09/21 0427 11/10/21 0500 11/10/21 1433  Weight: 59.7 kg 61.7 kg 61.9 kg    Examination: ` General exam: No acute distress.  Feeling better Respiratory system: Crackles at bases.  Normal breathing.  2 L Cardiovascular system: S1-S2, RRR, no murmurs, no pedal edema Gastrointestinal system: Soft, nondistended, mild TTP, normal bowel sounds. Central nervous system: Alert and oriented. No focal  neurological deficits. Extremities: Symmetrically decreased power bilaterally Skin: No rashes, lesions or ulcers Psychiatry: Judgement and insight appear normal. Mood & affect flattened.     Data Reviewed: I have personally reviewed following labs and imaging studies  CBC: Recent Labs  Lab 11/08/21 0216 11/08/21 1401 11/09/21 0515 11/09/21 1254 11/09/21 1643 11/10/21 0440 11/11/21 0528  WBC 12.6*  --  11.3*  --  10.3 8.8 6.6  NEUTROABS 10.4*  --  9.2*  --  8.2* 6.5 4.6  HGB 8.0*   < > 9.2* 9.3* 9.2* 8.5* 8.2*  HCT 23.0*   < > 26.6* 26.8* 26.8* 24.4* 22.9*  MCV 82.1  --  81.3  --  81.5 80.5 78.7*  PLT 89*  --  122*  --  145* 144* 147*   < > = values in this interval not displayed.   Basic Metabolic Panel: Recent Labs  Lab 11/06/21 0436 11/06/21 1526 11/07/21 0512 11/08/21 0216 11/09/21 0515 11/10/21 0440 11/11/21 0528  NA 138  --  135 133* 135 131*  --   K 4.6   < > 5.2* 4.0 4.0 3.7 3.4*  CL 100  --  99 96* 93* 91*  --   CO2 26  --  24  $'28 29 27  'A$ --   GLUCOSE 105*  --  97 116* 106* 106*  --   BUN 49*  --  63* 34* 25* 40*  --   CREATININE 8.25*  --  10.10* 5.91* 4.88* 6.75*  --   CALCIUM 8.3*  --  8.8* 9.3 9.5 10.0  --   MG 1.8   < > 1.9 1.7 2.2 2.2 1.9  PHOS 7.1*  --  8.6* 5.0* 5.4* 6.3* 4.3   < > = values in this interval not displayed.   GFR: Estimated Creatinine Clearance: 7.3 mL/min (A) (by C-G formula based on SCr of 6.75 mg/dL (H)). Liver Function Tests: Recent Labs  Lab 11/05/21 0320 11/07/21 0512  AST 19  --   ALT 11  --   ALKPHOS 58  --   BILITOT 0.5  --   PROT 5.1*  --   ALBUMIN 2.7* 2.8*   No results for input(s): "LIPASE", "AMYLASE" in the last 168 hours. No results for input(s): "AMMONIA" in the last 168 hours. Coagulation Profile: Recent Labs  Lab 11/05/21 1003 11/06/21 0436 11/07/21 0512 11/08/21 1838 11/10/21 1625  INR 1.4* 1.4* 1.5* 1.5* 1.3*   Cardiac Enzymes: No results for input(s): "CKTOTAL", "CKMB", "CKMBINDEX",  "TROPONINI" in the last 168 hours. BNP (last 3 results) No results for input(s): "PROBNP" in the last 8760 hours. HbA1C: No results for input(s): "HGBA1C" in the last 72 hours. CBG: Recent Labs  Lab 11/10/21 1927 11/10/21 2321 11/11/21 0332 11/11/21 0743 11/11/21 1154  GLUCAP 100* 97 92 86 93   Lipid Profile: No results for input(s): "CHOL", "HDL", "LDLCALC", "TRIG", "CHOLHDL", "LDLDIRECT" in the last 72 hours. Thyroid Function Tests: No results for input(s): "TSH", "T4TOTAL", "FREET4", "T3FREE", "THYROIDAB" in the last 72 hours. Anemia Panel: No results for input(s): "VITAMINB12", "FOLATE", "FERRITIN", "TIBC", "IRON", "RETICCTPCT" in the last 72 hours. Sepsis Labs: Recent Labs  Lab 11/05/21 1003 11/05/21 1322 11/05/21 2050 11/06/21 0436 11/06/21 0821 11/10/21 0817  PROCALCITON  --   --   --  2.08  --  3.96  LATICACIDVEN 4.6* 1.5 1.2  --  1.2  --     Recent Results (from the past 240 hour(s))  Blood Culture (routine x 2)     Status: None   Collection Time: 11/05/21  3:20 AM   Specimen: BLOOD  Result Value Ref Range Status   Specimen Description BLOOD RIGHT FA  Final   Special Requests   Final    BOTTLES DRAWN AEROBIC AND ANAEROBIC Blood Culture results may not be optimal due to an inadequate volume of blood received in culture bottles   Culture   Final    NO GROWTH 5 DAYS Performed at [facility], [address] [place], [place] [address]    Report Status 11/10/2021 FINAL  Final  SARS Coronavirus 2 by RT PCR (hospital order, performed in [facility] hospital lab) *cepheid single result test* Anterior Nasal Swab     Status: None   Collection Time: 11/05/21  3:20 AM   Specimen: Anterior Nasal Swab  Result Value Ref Range Status   SARS Coronavirus 2 by RT PCR NEGATIVE NEGATIVE Final    Comment: (NOTE) SARS-CoV-2 target nucleic acids are NOT DETECTED.  The SARS-CoV-2 RNA is generally detectable in upper and lower respiratory specimens during the acute  phase of infection. The lowest concentration of SARS-CoV-2 viral copies this assay can detect is 250 copies / mL. A negative result does not preclude SARS-CoV-2 infection and should not  be used as the sole basis for treatment or other patient management decisions.  A negative result may occur with improper specimen collection / handling, submission of specimen other than nasopharyngeal swab, presence of viral mutation(s) within the areas targeted by this assay, and inadequate number of viral copies (<250 copies / mL). A negative result must be combined with clinical observations, patient history, and epidemiological information.  Fact Sheet for Patients:   https://www.patel.info/  Fact Sheet for Healthcare Providers: https://hall.com/  This test is not yet approved or  cleared by the [place] FDA and has been authorized for detection and/or diagnosis of SARS-CoV-2 by FDA under an Emergency Use Authorization (EUA).  This EUA will remain in effect (meaning this test can be used) for the duration of the COVID-19 declaration under Section 564(b)(1) of the Act, 21 U.S.C. section 360bbb-3(b)(1), unless the authorization is terminated or revoked sooner.  Performed at [facility], [address] [place], [place] [address]   Blood Culture (routine x 2)     Status: None   Collection Time: 11/05/21  3:21 AM   Specimen: BLOOD  Result Value Ref Range Status   Specimen Description BLOOD RIGHT HAND  Final   Special Requests   Final    BOTTLES DRAWN AEROBIC AND ANAEROBIC Blood Culture results may not be optimal due to an inadequate volume of blood received in culture bottles   Culture   Final    NO GROWTH 5 DAYS Performed at [facility], [address], [place], [place] [address]    Report Status 11/10/2021 FINAL  Final  MRSA Next Gen by PCR, Nasal     Status: None   Collection Time: 11/05/21  6:57 AM   Specimen: Nasal  Mucosa; Nasal Swab  Result Value Ref Range Status   MRSA by PCR Next Gen NOT DETECTED NOT DETECTED Final    Comment: (NOTE) The GeneXpert MRSA Assay (FDA approved for NASAL specimens only), is one component of a comprehensive MRSA colonization surveillance program. It is not intended to diagnose MRSA infection nor to guide or monitor treatment for MRSA infections. Test performance is not FDA approved in patients less than 41 years old. Performed at [facility], [address], [place], [place] [address]          Radiology Studies: DG Abd 1 View  Result Date: 11/09/2021 CLINICAL DATA:  Nausea and vomit. EXAM: ABDOMEN - 1 VIEW COMPARISON:  CT 11/06/2021 FINDINGS: New gaseous gastric distension. There is air throughout prominent small and large bowel. Nonobstructive pattern. No evidence of free air. Coils in the left upper quadrant again seen. Diminishing gallbladder distension, high-density gallbladder contents again seen. Left femoral catheter in place. IMPRESSION: New gaseous gastric distension with air throughout prominent small and large bowel. Overall findings suggestive of generalized ileus. Electronically Signed   By: Keith Rake M.D.   On: 11/09/2021 17:22        Scheduled Meds:  Chlorhexidine Gluconate Cloth  6 each Topical Q0600   feeding supplement (NEPRO CARB STEADY)  237 mL Oral TID BM   metoprolol tartrate  2.5 mg Intravenous Q6H   multivitamin  1 tablet Oral QHS   polyethylene glycol  17 g Oral Daily   senna-docusate  1 tablet Oral BID   Continuous Infusions:  sodium chloride Stopped (11/10/21 1349)   amiodarone 60 mg/hr (11/11/21 0954)   anticoagulant sodium citrate     piperacillin-tazobactam (ZOSYN)  IV Stopped (11/11/21 0549)     LOS: 6  days      Sidney Ace, MD [facility]   If 7PM-7AM, please contact night-coverage  11/11/2021, 2:06 PM

## 2021-11-11 NOTE — Plan of Care (Signed)
Patient remains in ICU overnight; "progressive" level of care. Patient has been weaned from all continuous infusions. Patient remains on supplemental O2 at 3 L/min via [place]. Patient has two CVCs that remain in place.   Problem: Education: Goal: Knowledge of General Education information will improve Description: Including pain rating scale, medication(s)/side effects and non-pharmacologic comfort measures Outcome: Progressing   Problem: Health Behavior/Discharge Planning: Goal: Ability to manage health-related needs will improve Outcome: Progressing   Problem: Clinical Measurements: Goal: Ability to maintain clinical measurements within normal limits will improve Outcome: Progressing Goal: Will remain free from infection Outcome: Progressing Goal: Diagnostic test results will improve Outcome: Progressing Goal: Respiratory complications will improve Outcome: Progressing Goal: Cardiovascular complication will be avoided Outcome: Progressing   Problem: Activity: Goal: Risk for activity intolerance will decrease Outcome: Progressing   Problem: Nutrition: Goal: Adequate nutrition will be maintained Outcome: Progressing   Problem: Coping: Goal: Level of anxiety will decrease Outcome: Progressing   Problem: Elimination: Goal: Will not experience complications related to bowel motility Outcome: Progressing Goal: Will not experience complications related to urinary retention Outcome: Progressing   Problem: Pain Managment: Goal: General experience of comfort will improve Outcome: Progressing   Problem: Safety: Goal: Ability to remain free from injury will improve Outcome: Progressing   Problem: Skin Integrity: Goal: Risk for impaired skin integrity will decrease Outcome: Progressing

## 2021-11-11 NOTE — Progress Notes (Signed)
Rounding Note    Patient Name: Teresa Cook Date of Encounter: 11/11/2021  [facility] Cardiologist: None   Subjective   No acute events, tolerating PO. Hgb has drifted down again. Remains in SR. Abdomen remains tender.  Inpatient Medications    Scheduled Meds:  amiodarone  200 mg Oral BID   Chlorhexidine Gluconate Cloth  6 each Topical Q0600   feeding supplement (NEPRO CARB STEADY)  237 mL Oral TID BM   metoprolol tartrate  2.5 mg Intravenous Q6H   multivitamin  1 tablet Oral QHS   polyethylene glycol  17 g Oral Daily   senna-docusate  1 tablet Oral BID   Continuous Infusions:  sodium chloride Stopped (11/10/21 1349)   anticoagulant sodium citrate     piperacillin-tazobactam (ZOSYN)  IV Stopped (11/11/21 0549)   PRN Meds: acetaminophen, alteplase, anticoagulant sodium citrate, bisacodyl, diazepam, heparin, HYDROmorphone (DILAUDID) injection, lidocaine (PF), lidocaine-prilocaine, ondansetron (ZOFRAN) IV, mouth rinse, pentafluoroprop-tetrafluoroeth   Vital Signs    Vitals:   11/11/21 0800 11/11/21 1000 11/11/21 1200 11/11/21 1400  BP: 134/87  (!) 142/95 139/85  Pulse: 76 91 83 89  Resp: 15 (!) 25 (!) 23 (!) 23  Temp:      TempSrc:      SpO2: 100% (!) 88% 100% 100%  Weight:        Intake/Output Summary (Last 24 hours) at 11/11/2021 1449 Last data filed at 11/11/2021 1400 Gross per 24 hour  Intake 1258.89 ml  Output 1 ml  Net 1257.89 ml      11/10/2021    2:33 PM 11/10/2021    5:00 AM 11/09/2021    4:27 AM  Last 3 Weights  Weight (lbs) 136 lb 7.4 oz 136 lb 0.4 oz 131 lb 9.8 oz  Weight (kg) 61.9 kg 61.7 kg 59.7 kg      Telemetry    SR with occasional PAC/PVCs- Personally Reviewed  ECG    No new tracings - Personally Reviewed  Physical Exam   GEN: Well nourished, well developed in no acute distress NECK: No JVD CARDIAC: regular rhythm, normal S1 and S2, no rubs or gallops. No murmur. VASCULAR: Radial pulses 2+ bilaterally.   RESPIRATORY:  Clear to auscultation without rales, wheezing or rhonchi  ABDOMEN: Soft, diffuse mildly tender, non-distended MUSCULOSKELETAL:  Moves all 4 limbs independently SKIN: Warm and dry, no edema NEUROLOGIC:  No focal neuro deficits noted. PSYCHIATRIC:  Normal affect    Labs    High Sensitivity Troponin:   Recent Labs  Lab 11/06/21 0950 11/06/21 1526 11/06/21 1743 11/09/21 1643 11/09/21 2041  TROPONINIHS '16 17 16 '$ 185* 172*     Chemistry Recent Labs  Lab 11/05/21 0320 11/06/21 0436 11/07/21 0512 11/08/21 0216 11/09/21 0515 11/10/21 0440 11/11/21 0528  NA 141   < > 135 133* 135 131*  --   K 4.7   < > 5.2* 4.0 4.0 3.7 3.4*  CL 107   < > 99 96* 93* 91*  --   CO2 19*   < > '24 28 29 27  '$ --   GLUCOSE 185*   < > 97 116* 106* 106*  --   BUN 65*   < > 63* 34* 25* 40*  --   CREATININE 10.38*   < > 10.10* 5.91* 4.88* 6.75*  --   CALCIUM 7.8*   < > 8.8* 9.3 9.5 10.0  --   MG  --    < > 1.9 1.7 2.2 2.2 1.9  PROT 5.1*  --   --   --   --   --   --  ALBUMIN 2.7*  --  2.8*  --   --   --   --   AST 19  --   --   --   --   --   --   ALT 11  --   --   --   --   --   --   ALKPHOS 58  --   --   --   --   --   --   BILITOT 0.5  --   --   --   --   --   --   GFRNONAA 4*   < > 4* 7* 9* 6*  --   ANIONGAP 15   < > '12 9 13 13  '$ --    < > = values in this interval not displayed.    Lipids No results for input(s): "CHOL", "TRIG", "HDL", "LABVLDL", "LDLCALC", "CHOLHDL" in the last 168 hours.  Hematology Recent Labs  Lab 11/09/21 1643 11/10/21 0440 11/11/21 0528  WBC 10.3 8.8 6.6  RBC 3.29* 3.03* 2.91*  HGB 9.2* 8.5* 8.2*  HCT 26.8* 24.4* 22.9*  MCV 81.5 80.5 78.7*  MCH 28.0 28.1 28.2  MCHC 34.3 34.8 35.8  RDW 16.0* 16.0* 15.8*  PLT 145* 144* 147*   Thyroid  Recent Labs  Lab 11/05/21 0658 11/06/21 1526  TSH 3.354 2.080  FREET4 0.84  --     BNP Recent Labs  Lab 11/05/21 0320  BNP 128.8*    DDimer  Recent Labs  Lab 11/06/21 0950  DDIMER 16.58*     Radiology     DG Abd 1 View  Result Date: 11/09/2021 CLINICAL DATA:  Nausea and vomit. EXAM: ABDOMEN - 1 VIEW COMPARISON:  CT 11/06/2021 FINDINGS: New gaseous gastric distension. There is air throughout prominent small and large bowel. Nonobstructive pattern. No evidence of free air. Coils in the left upper quadrant again seen. Diminishing gallbladder distension, high-density gallbladder contents again seen. Left femoral catheter in place. IMPRESSION: New gaseous gastric distension with air throughout prominent small and large bowel. Overall findings suggestive of generalized ileus. Electronically Signed   By: Keith Rake M.D.   On: 11/09/2021 17:22    Cardiac Studies   2D echo 11/06/2021: 1. Left ventricular ejection fraction, by estimation, is 50 to 55%. The  left ventricle has low normal function. The left ventricle has no regional  wall motion abnormalities. There is mild concentric left ventricular  hypertrophy. Left ventricular  diastolic parameters are consistent with Grade I diastolic dysfunction  (impaired relaxation).   2. Right ventricular systolic function is normal. The right ventricular  size is normal.   3. The mitral valve is normal in structure. Trivial mitral valve  regurgitation.   4. The aortic valve is calcified. Aortic valve regurgitation is not  visualized. Aortic valve sclerosis/calcification is present, without any  evidence of aortic stenosis.   Patient Profile     66 y.o. female with a history of ESRD on hemodialysis, HFpEF, PAF on warfarin, DVT, cerebral meningioma, depression, and breast cancer, who is being seen and evaluated for A-fib RVR and NSVT.  Assessment & Plan    Atrial fibrillation with RVR -currently in sinus -remains on amiodarone drip, not ideal given inability to anticoagulate but limited other options given clinical picture. Will transition to oral amiodarone today. -holding on anticoagulation, Hgb down to 8.2 today -Would challenge with IV  heparin once Hgb stabilized. Given supratherapeutic INR on coumadin, would consider using apixaban alternatively if affordable for  patient. Defer to nephrology as they have been managing her coumadin. -CHA2DS2/VAS Stroke Risk Points=5  -recommend keeping potassium 4 and mg 2, defer to nephrology given ESRD -continue with cardiac monitoring  NSVT -asymptomatic -continued amiodarone -continue telemetry  HFpEF -volume management per hemodialysis -LVEF 50-55%, NRWMA, G1DD, and trivial MR -remain cautious with fluids -daily weight and I&O's  Hemorrhagic shock secondary to right renal hemorrhage with hypovolemic syncope and acute blood loss anemia in the setting of supratherapeutic INR -hgb 9.2-> 8.5 -> 8.2 today -vasopressors stopped 11/3 at 0350 -s/p right renal artery embolization on 11/05/21 -management per primary team  ESRD on HD -management per Nephrology  [facility] will sign off.   Medication Recommendations:  amiodarone 200 mg BID for 7 days, then 200 mg daily. Would trial IV heparin (no bolus) once Hgb stable, if remains stable would consider apixaban over warfarin, though defer to nephrology as they have been managing her coumadin. Other recommendations (labs, testing, etc):  none Follow up as an outpatient:  I will request outpatient cardiology follow up.   For questions or updates, please contact [employer] Please consult [email or web address] for contact info under        Signed, Buford Dresser, MD  11/11/2021, 2:49 PM

## 2021-11-12 ENCOUNTER — Other Ambulatory Visit (HOSPITAL_COMMUNITY): Payer: Self-pay

## 2021-11-12 ENCOUNTER — Telehealth (HOSPITAL_COMMUNITY): Payer: Self-pay

## 2021-11-12 DIAGNOSIS — Z515 Encounter for palliative care: Secondary | ICD-10-CM | POA: Diagnosis not present

## 2021-11-12 DIAGNOSIS — Z7189 Other specified counseling: Secondary | ICD-10-CM | POA: Diagnosis not present

## 2021-11-12 DIAGNOSIS — J9601 Acute respiratory failure with hypoxia: Secondary | ICD-10-CM

## 2021-11-12 DIAGNOSIS — R579 Shock, unspecified: Secondary | ICD-10-CM | POA: Diagnosis not present

## 2021-11-12 DIAGNOSIS — I959 Hypotension, unspecified: Secondary | ICD-10-CM | POA: Diagnosis not present

## 2021-11-12 LAB — CBC WITH DIFFERENTIAL/PLATELET
Abs Immature Granulocytes: 0.11 10*3/uL — ABNORMAL HIGH (ref 0.00–0.07)
Basophils Absolute: 0 10*3/uL (ref 0.0–0.1)
Basophils Relative: 0 %
Eosinophils Absolute: 0.2 10*3/uL (ref 0.0–0.5)
Eosinophils Relative: 3 %
HCT: 25.1 % — ABNORMAL LOW (ref 36.0–46.0)
Hemoglobin: 8.8 g/dL — ABNORMAL LOW (ref 12.0–15.0)
Immature Granulocytes: 2 %
Lymphocytes Relative: 12 %
Lymphs Abs: 0.9 10*3/uL (ref 0.7–4.0)
MCH: 27.8 pg (ref 26.0–34.0)
MCHC: 35.1 g/dL (ref 30.0–36.0)
MCV: 79.4 fL — ABNORMAL LOW (ref 80.0–100.0)
Monocytes Absolute: 1.1 10*3/uL — ABNORMAL HIGH (ref 0.1–1.0)
Monocytes Relative: 15 %
Neutro Abs: 4.9 10*3/uL (ref 1.7–7.7)
Neutrophils Relative %: 68 %
Platelets: 186 10*3/uL (ref 150–400)
RBC: 3.16 MIL/uL — ABNORMAL LOW (ref 3.87–5.11)
RDW: 15.9 % — ABNORMAL HIGH (ref 11.5–15.5)
WBC: 7.2 10*3/uL (ref 4.0–10.5)
nRBC: 0 % (ref 0.0–0.2)

## 2021-11-12 LAB — GLUCOSE, CAPILLARY
Glucose-Capillary: 89 mg/dL (ref 70–99)
Glucose-Capillary: 87 mg/dL (ref 70–99)
Glucose-Capillary: 86 mg/dL (ref 70–99)
Glucose-Capillary: 134 mg/dL — ABNORMAL HIGH (ref 70–99)
Glucose-Capillary: 100 mg/dL — ABNORMAL HIGH (ref 70–99)

## 2021-11-12 LAB — POTASSIUM: Potassium: 3.9 mmol/L (ref 3.5–5.1)

## 2021-11-12 LAB — PHOSPHORUS: Phosphorus: 4.8 mg/dL — ABNORMAL HIGH (ref 2.5–4.6)

## 2021-11-12 LAB — MAGNESIUM: Magnesium: 2 mg/dL (ref 1.7–2.4)

## 2021-11-12 MED ORDER — SALINE SPRAY 0.65 % NA SOLN: 1.0000 | NASAL | Status: DC | PRN

## 2021-11-12 MED ORDER — FLUTICASONE PROPIONATE 50 MCG/ACT NA SUSP
2.0000 | Freq: Every day | NASAL | Status: DC | PRN
  Filled 2021-11-12: qty 16

## 2021-11-12 NOTE — Progress Notes (Addendum)
Daily Progress Note   Patient Name: Teresa Cook       Date: 11/12/2021 DOB: 10/13/1955  Age: 66 y.o. MRN#: [id number] Attending Physician: Sidney Ace, MD Primary Care Physician: [facility] Date: 11/05/2021 Length of Stay: 7 days  Reason for Consultation/Follow-up: Establishing goals of care  HPI/Patient Profile:  66 y.o. female  with past medical history of ESRD on HD, A. Fib & DVT on Warfarin , admitted with Hemorrhagic shock due to right renal hemorrhage with large hematoma in setting of supratherapeutic INR (INR >9).  Vascular Surgery consulted, required coil embolization of the right renal artery.  She was admitted  hemorrhagic shock in the setting of right renal hemorrhage,on 11/05/2021 with hypotension, acute blood loss anemia, ESRD on hemodialysis, and others.    PMT was consulted for [place] conversations.  Subjective:   Subjective: Chart Reviewed. Updates received. Patient Assessed. Created space and opportunity for patient  and family to explore thoughts and feelings regarding current medical situation.  Today's Discussion: Today met with the patient at bedside.  Her daughter was not present.  She is feeling a bit better today, but says she's tired and weak.  We discussed with her anemia and significant illness it is not unexpected for her to be weak or tired. This should hopefully improve over time. Also, some form of rehab may be recommended by PT to help with this. However, we celebrated her progress since last week. Still has some pain but explained hematoma and associated pain should slowly resolve as it's reabsorbed. She did note her nares are dry and irritated and I offered to order "Ocean" nasal saline spray, which she accepted.  We summarized our previous discussions about goals and she would not want to live with artificial prolongation (ventilators, trach, etc.) including no feeding tube.  I informed her I was unable to see her  daughter in person last day I was here. She states she has discussed all this with her daughter and they are in agreement. I offered to meet her daughter when she comes to the hospital and left my contact card for her.  I provided emotional and general support through therapeutic listening, empathy, sharing of stories, and other techniques. I answered all questions and addressed all concerns to the best of my ability.  I told the patient I would follow peripherally but am available to come back at any point if requested/needed.  Review of Systems  Constitutional:  Positive for fatigue.       Feels better overall today compared to yesterday  Respiratory:  Negative for chest tightness and shortness of breath (remains improved but persistent).   Gastrointestinal:  Negative for abdominal pain, nausea and vomiting.  Neurological:  Positive for weakness.    Objective:   Vital Signs:  BP 124/74 (BP Location: Right Arm)   Pulse 85   Temp 98 F (36.7 C) (Oral)   Resp 18   Ht _0  (1.651 m)   Wt 61.9 kg   SpO2 91%   BMI 22.71 kg/m   Physical Exam: Physical Exam Vitals and nursing note reviewed.  Constitutional:      General: She is sleeping. She is not in acute distress. HENT:     Head: Normocephalic and atraumatic.  Cardiovascular:     Rate and Rhythm: Normal rate.     Comments: Alternates in and out of AFib and NSR; rate controlled Pulmonary:     Effort: Pulmonary effort is normal. No respiratory distress.  Breath sounds: Normal breath sounds.  Abdominal:     General: Abdomen is flat.     Palpations: Abdomen is soft.     Tenderness: There is abdominal tenderness (RUQ).  Skin:    General: Skin is warm and dry.  Neurological:     General: No focal deficit present.     Mental Status: She is easily aroused.  Psychiatric:        Mood and Affect: Mood normal.        Behavior: Behavior normal.     Palliative Assessment/Data: 40%    Existing Vynca/ACP  Documentation: HCPOA dated 09/11/2016  Assessment & Plan:   Impression: Present on Admission:  Hypotension  PAF (paroxysmal atrial fibrillation) (HCC)  Acute respiratory failure with hypoxia (HCC)  HTN (hypertension)   66 year old female with chronic comorbidities and acute presentations as described above.  The bleeding has seemed to stop and her INR is corrected.  Hemorrhagic shock has resolved.  She intermittently had been going in and out of A-fib and becomes short of breath with associated hypotension, but this seems to have resolved.  This was most frequently occurred with dialysis and she has not had this problem before.  I suspect that she is either able to tolerate her significant anemia or hemodialysis, but not both at the same time.  Still with some abdominal pain likely from RP bleed. Still with weakness and fatigue likely multifactorial of anemia, significant illness.  Overall prognosis guarded to poor.   SUMMARY OF RECOMMENDATIONS   Remain DNR per PCCM discussion/note  No feeding tube Otherwise full scope of care Goals are clear PMT will continue to follow peripherally Please notify us of any significant clinical changes or new palliative needs  Symptom Management:  Per primary team PMT is available to assist as needed  Code Status: Full code  Prognosis: Unable to determine  Discharge Planning: To Be Determined  Discussed with: Patient, medical team, nursing team  Thank you for allowing [place] to participate in the care of Teresa Cook PMT will continue to support holistically.  Billing based on MDM: High  Problems Addressed: One acute or chronic illness or injury that poses a threat to life or bodily function  Amount and/or Complexity of Data: Category 3:Discussion of management or test interpretation with external physician/other qualified health care professional/appropriate source (not separately reported)  Risks: N/A  Walden Field, NP Palliative Medicine  Team  Team Phone # [phone] (Nights/Weekends)  09/05/2020, 8:17 AM

## 2021-11-12 NOTE — Care Management Important Message (Signed)
Important Message  Patient Details  Name: Teresa Cook MRN: [id number] Date of Birth: 05/25/1955   Medicare Important Message Given:  Yes     [name] 11/12/2021, 12:55 PM

## 2021-11-12 NOTE — Progress Notes (Signed)
Pt did 2hr 55mn of HD, tolerated well with no signs of distress   UF = 500 ml    11/12/21 1759  Vitals  Temp 99.3 F (37.4 C)  Temp Source Axillary  BP 138/81  MAP (mmHg) 100  BP Location Right Arm  BP Method Automatic  Patient Position (if appropriate) Lying  Pulse Rate Source Monitor  Oxygen Therapy  SpO2 100 %  O2 Device Nasal Cannula  O2 Flow Rate (L/min) 4 L/min  Post Treatment  Dialyzer Clearance Lightly streaked  Duration of HD Treatment -hour(s) 2.75 hour(s)  Hemodialysis Intake (mL) 0 mL  Liters Processed 50.2  Fluid Removed (mL) 500 mL  Tolerated HD Treatment Yes  Post-Hemodialysis Comments HD complete, tolerated well  Hemodialysis Catheter Left Internal jugular Permanent  Placement Date: (c)   Placed prior to admission: Yes  Orientation: Left  Access Location: Internal jugular  Hemodialysis Catheter Type: Permanent  Site Condition No complications  Blue Lumen Status Flushed;Dead end cap in place;Heparin locked  Red Lumen Status Flushed;Dead end cap in place;Heparin locked  Purple Lumen Status N/A  Catheter fill solution Heparin 1000 units/ml  Catheter fill volume (Arterial) 1.7 cc  Catheter fill volume (Venous) 1.7  Dressing Type Transparent  Dressing Status Antimicrobial disc in place;Clean, Dry, Intact  Interventions New dressing  Drainage Description None  Post treatment catheter status Capped and Clamped

## 2021-11-12 NOTE — Consult Note (Signed)
PHARMACY CONSULT NOTE  Pharmacy Consult for Electrolyte Monitoring and Replacement   Recent Labs: Potassium (mmol/L)  Date Value  11/12/2021 3.9  02/03/2014 4.0   Magnesium (mg/dL)  Date Value  11/12/2021 2.0   Calcium (mg/dL)  Date Value  11/10/2021 10.0   Calcium, Total (mg/dL)  Date Value  02/03/2014 8.6   Albumin (g/dL)  Date Value  11/07/2021 2.8 (L)  02/02/2014 3.5   Phosphorus (mg/dL)  Date Value  11/12/2021 4.8 (H)  01/17/2014 4.9   Sodium (mmol/L)  Date Value  11/10/2021 131 (L)  02/29/2020 140  02/03/2014 138   Corrected Calcium: 9.3 mg/dL  Assessment: Patient is a 67 y/o F with medical history including depression / anxiety, DVT, 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 Patient subsequently found to have severe acute right renal hemorrhage in setting of supratherapeutic INR on warfarin. Patient ordered emergent reversal with Kcentra and vitamin K. Pharmacy consulted to assist with electrolyte monitoring and replacement as indicated.  Hyperphosphatemia d/t renal osteodystrophy- managed outpatient.   Goal of Therapy:  Within normal limits  Plan:  No replacement needed at this time. Pt transferred from ICU to PCU. Pharmacy will sign off. Please re-consult if needed.   Oswald Hillock, PharmD, BCPS 11/12/2021 8:24 AM

## 2021-11-12 NOTE — Progress Notes (Signed)
[facility]  ROUNDING NOTE   Subjective:   Teresa Cook is a 66 year old African-American female with past medical conditions including hypertension, gout, anxiety with depression, sickle cell trait, systolic heart failure with a EF 40 to 45%, breast cancer with bilateral mastectomy, seizures, and end-stage renal disease on hemodialysis.  Patient presents to the emergency department via EMS for multiple syncopal episodes.  Patient has been admitted for Right flank pain [R10.9] Elevated lactic acid level [R79.89] Hypotension [I95.9] Hypotension, unspecified hypotension type [I95.9] Altered mental status, unspecified altered mental status type [R41.82]  Patient is known to our practice and receives outpatient dialysis treatments at [facility] on a MWF schedule, supervised by Dr. Candiss Norse.    Patient resting quietly in bed, alert and oriented No family at bedside Patient states appetite remains poor but improving.  States she is drinking Nepro between meals. Currently on room air, denies shortness of breath No lower extremity edema  Objective:  Vital signs in last 24 hours:  Temp:  [97.8 F (36.6 C)-98.7 F (37.1 C)] 98 F (36.7 C) (11/06 1207) Pulse Rate:  [81-99] 85 (11/06 1207) Resp:  [16-27] 18 (11/06 1207) BP: (120-165)/(71-111) 124/74 (11/06 1207) SpO2:  [91 %-100 %] 91 % (11/06 1207)  Weight change:  Filed Weights   11/09/21 0427 11/10/21 0500 11/10/21 1433  Weight: 59.7 kg 61.7 kg 61.9 kg    Intake/Output: I/O last 3 completed shifts: In: 1408.9 [I.V.:1108.9; IV Piggyback:300] Out: -    Intake/Output this shift:  No intake/output data recorded.  Physical Exam: General: NAD  Head: Normocephalic, atraumatic. Moist oral mucosal membranes  Eyes: Anicteric  Lungs:  Clear to auscultation, normal effort, room air  Heart: Regular rate and rhythm  Abdomen:  Soft, nontender  Extremities:  No peripheral edema.  Neurologic: Nonfocal, moving all  four extremities  Skin: No lesions  Access: Lt Permcath    Basic Metabolic Panel: Recent Labs  Lab 11/06/21 0436 11/06/21 1526 11/07/21 0512 11/08/21 0216 11/09/21 0515 11/10/21 0440 11/11/21 0528 11/12/21 0452  NA 138  --  135 133* 135 131*  --   --   K 4.6   < > 5.2* 4.0 4.0 3.7 3.4* 3.9  CL 100  --  99 96* 93* 91*  --   --   CO2 26  --  '24 28 29 27  '$ --   --   GLUCOSE 105*  --  97 116* 106* 106*  --   --   BUN 49*  --  63* 34* 25* 40*  --   --   CREATININE 8.25*  --  10.10* 5.91* 4.88* 6.75*  --   --   CALCIUM 8.3*  --  8.8* 9.3 9.5 10.0  --   --   MG 1.8   < > 1.9 1.7 2.2 2.2 1.9 2.0  PHOS 7.1*  --  8.6* 5.0* 5.4* 6.3* 4.3 4.8*   < > = values in this interval not displayed.     Liver Function Tests: Recent Labs  Lab 11/07/21 0512  ALBUMIN 2.8*    No results for input(s): "LIPASE", "AMYLASE" in the last 168 hours. No results for input(s): "AMMONIA" in the last 168 hours.  CBC: Recent Labs  Lab 11/09/21 0515 11/09/21 1254 11/09/21 1643 11/10/21 0440 11/11/21 0528 11/12/21 0452  WBC 11.3*  --  10.3 8.8 6.6 7.2  NEUTROABS 9.2*  --  8.2* 6.5 4.6 4.9  HGB 9.2* 9.3* 9.2* 8.5* 8.2* 8.8*  HCT 26.6* 26.8* 26.8*  24.4* 22.9* 25.1*  MCV 81.3  --  81.5 80.5 78.7* 79.4*  PLT 122*  --  145* 144* 147* 186     Cardiac Enzymes: No results for input(s): "CKTOTAL", "CKMB", "CKMBINDEX", "TROPONINI" in the last 168 hours.  BNP: Invalid input(s): "POCBNP"  CBG: Recent Labs  Lab 11/11/21 1919 11/12/21 0108 11/12/21 0448 11/12/21 0849 11/12/21 1209  GLUCAP 106* 89 87 86 134*     Microbiology: Results for orders placed or performed during the hospital encounter of 11/05/21  Blood Culture (routine x 2)     Status: None   Collection Time: 11/05/21  3:20 AM   Specimen: BLOOD  Result Value Ref Range Status   Specimen Description BLOOD RIGHT FA  Final   Special Requests   Final    BOTTLES DRAWN AEROBIC AND ANAEROBIC Blood Culture results may not be optimal due  to an inadequate volume of blood received in culture bottles   Culture   Final    NO GROWTH 5 DAYS Performed at [facility], [address] [place], [place] [address]    Report Status 11/10/2021 FINAL  Final  SARS Coronavirus 2 by RT PCR (hospital order, performed in [facility] hospital lab) *cepheid single result test* Anterior Nasal Swab     Status: None   Collection Time: 11/05/21  3:20 AM   Specimen: Anterior Nasal Swab  Result Value Ref Range Status   SARS Coronavirus 2 by RT PCR NEGATIVE NEGATIVE Final    Comment: (NOTE) SARS-CoV-2 target nucleic acids are NOT DETECTED.  The SARS-CoV-2 RNA is generally detectable in upper and lower respiratory specimens during the acute phase of infection. The lowest concentration of SARS-CoV-2 viral copies this assay can detect is 250 copies / mL. A negative result does not preclude SARS-CoV-2 infection and should not be used as the sole basis for treatment or other patient management decisions.  A negative result may occur with improper specimen collection / handling, submission of specimen other than nasopharyngeal swab, presence of viral mutation(s) within the areas targeted by this assay, and inadequate number of viral copies (<250 copies / mL). A negative result must be combined with clinical observations, patient history, and epidemiological information.  Fact Sheet for Patients:   https://www.patel.info/  Fact Sheet for Healthcare Providers: https://hall.com/  This test is not yet approved or  cleared by the [place] FDA and has been authorized for detection and/or diagnosis of SARS-CoV-2 by FDA under an Emergency Use Authorization (EUA).  This EUA will remain in effect (meaning this test can be used) for the duration of the COVID-19 declaration under Section 564(b)(1) of the Act, 21 U.S.C. section 360bbb-3(b)(1), unless the authorization is terminated or revoked  sooner.  Performed at [facility], [address] [place], [place] [address]   Blood Culture (routine x 2)     Status: None   Collection Time: 11/05/21  3:21 AM   Specimen: BLOOD  Result Value Ref Range Status   Specimen Description BLOOD RIGHT HAND  Final   Special Requests   Final    BOTTLES DRAWN AEROBIC AND ANAEROBIC Blood Culture results may not be optimal due to an inadequate volume of blood received in culture bottles   Culture   Final    NO GROWTH 5 DAYS Performed at [facility], [address] [place], [place] [address]    Report Status 11/10/2021 FINAL  Final  MRSA Next Gen by PCR, Nasal     Status: None  Collection Time: 11/05/21  6:57 AM   Specimen: Nasal Mucosa; Nasal Swab  Result Value Ref Range Status   MRSA by PCR Next Gen NOT DETECTED NOT DETECTED Final    Comment: (NOTE) The GeneXpert MRSA Assay (FDA approved for NASAL specimens only), is one component of a comprehensive MRSA colonization surveillance program. It is not intended to diagnose MRSA infection nor to guide or monitor treatment for MRSA infections. Test performance is not FDA approved in patients less than 40 years old. Performed at [facility] [address] [place], [place] [address]     Coagulation Studies: Recent Labs    11/10/21 1625  LABPROT 16.4*  INR 1.3*     Urinalysis: No results for input(s): "COLORURINE", "LABSPEC", "PHURINE", "GLUCOSEU", "HGBUR", "BILIRUBINUR", "KETONESUR", "PROTEINUR", "UROBILINOGEN", "NITRITE", "LEUKOCYTESUR" in the last 72 hours.  Invalid input(s): "APPERANCEUR"    Imaging: No results found.   Medications:    sodium chloride Stopped (11/10/21 1349)   anticoagulant sodium citrate     piperacillin-tazobactam (ZOSYN)  IV Stopped (11/12/21 0536)    amiodarone  200 mg Oral BID   Chlorhexidine Gluconate Cloth  6 each Topical Q0600   feeding supplement (NEPRO CARB STEADY)  237 mL Oral TID BM   metoprolol tartrate   2.5 mg Intravenous Q6H   multivitamin  1 tablet Oral QHS   polyethylene glycol  17 g Oral Daily   senna-docusate  1 tablet Oral BID   acetaminophen, alteplase, anticoagulant sodium citrate, bisacodyl, diazepam, fluticasone, heparin, HYDROmorphone (DILAUDID) injection, lidocaine (PF), lidocaine-prilocaine, ondansetron (ZOFRAN) IV, mouth rinse, pentafluoroprop-tetrafluoroeth  Assessment/ Plan:  Ms. Kazzandra Desaulniers is a 66 y.o.  female is a 66 year old African-American female with past medical conditions including hypertension, gout, anxiety with depression, sickle cell trait, systolic heart failure with a EF 40 to 45%, breast cancer with bilateral mastectomy, seizures, and end-stage renal disease on hemodialysis.  Patient presents to the emergency department via EMS for multiple syncopal episodes.  Patient has been admitted for Right flank pain [R10.9] Elevated lactic acid level [R79.89] Hypotension [I95.9] Hypotension, unspecified hypotension type [I95.9] Altered mental status, unspecified altered mental status type [R41.82]  [facility]/MWF/Lt Permcath  End-stage renal disease on hemodialysis.  Will maintain outpatient schedule if possible.  Patient scheduled to receive dialysis later today, UF goal 0-0.5L.  Next dialysis treatment scheduled for Wednesday.  2. Anemia of chronic kidney disease with acute blood loss Lab Results  Component Value Date   HGB 8.8 (L) 11/12/2021  CT angio abdomen pelvis shows severe hemorrhage at right kidney.  Appreciate vascular surgery performing right renal angiogram with coil embolization to manage hemorrhage.  Patient received 2 units FFP and blood transfusions thus far during admission.  Hemoglobin slowly improving.  We will utilize heparin to lock dialysis catheter only.  Will defer to cardiology for need of heparin challenge.   3. Secondary Hyperparathyroidism: with outpatient labs: PTH 379, phosphorus 5.7, calcium 9.4 on June 18, 2021.   Lab  Results  Component Value Date   PTH 214 (H) 07/08/2021   CALCIUM 10.0 11/10/2021   PHOS 4.8 (H) 11/12/2021    We will continue to monitor bone minerals.  Continue sevelamer with all meals.  4.  Hypotension likely due to blood loss, Currently weaned off pressors.  Amiodarone transition to oral.  5.  Hyperkalemia, potassium corrected with dialysis   LOS: 7 Maisen Klingler 11/6/20231:46 PM

## 2021-11-12 NOTE — TOC Progression Note (Signed)
Transition of Care [facility]) - Progression Note    Patient Details  Name: Teresa Cook MRN: [id number] Date of Birth: 1955-10-30  Transition of Care [facility]) CM/SW [place], LCSW Phone Number: 11/12/2021, 12:45 PM  Clinical Narrative:  Patient agreeable to RW at discharge. She is still not interested in home health at this time. Encouraged her to notify CSW if she changes her mind prior to discharge or her PCP if she changes her mind after discharge. Daughter will transport her home at discharge.   Expected Discharge Plan: Home/Self Care Barriers to Discharge: Continued Medical Work up  Expected Discharge Plan and Services Expected Discharge Plan: Home/Self Care   Discharge Planning Services: CM Consult   Living arrangements for the past 2 months: Single Family Home                           HH Arranged: Patient Refused [facility]           Social Determinants of Health (SDOH) Interventions    Readmission Risk Interventions    11/06/2021   12:18 PM 07/30/2021    2:09 PM  Readmission Risk Prevention Plan  Transportation Screening Complete Complete  Medication Review Press photographer) Complete Complete  PCP or Specialist appointment within 3-5 days of discharge Complete Complete  HRI or Home Care Consult Patient refused Complete  SW Recovery Care/Counseling Consult Not Complete   SW Consult Not Complete Comments NA   Palliative Care Screening Not Applicable Not [place] Not Applicable Not Applicable

## 2021-11-12 NOTE — Progress Notes (Signed)
Physical Therapy Treatment Patient Details Name: Teresa Cook MRN: [id number] DOB: [birth year] Today's Date: 11/12/2021   History of Present Illness Patient is a 66 year old female with medical history significant for ESRD on HD, A. Fib & DVT on Warfarin , admitted with Hemorrhagic shock due to right renal hemorrhage with large hematoma in setting of supratherapeutic INR (INR >9).  Vascular Surgery consulted, required coil embolization of the right renal artery.    PT Comments    Able to progress mobility efforts to include in-room gait (25' x1, 20' x1) with RW, cga/min assist.  Slow and deliberate, mild forward trunk flexion; limited functional endurance and dynamic balance noted.  Do recommend continued use of RW for all mobility tasks; patient in agreement.  Of note, patient had self-discontinued O2 upon arrival to room; maintains sats >90% on RA at rest and with exertion throughout session.  Left on RA end of session; RN/MD informed/aware.    Recommendations for follow up therapy are one component of a multi-disciplinary discharge planning process, led by the attending physician.  Recommendations may be updated based on patient status, additional functional criteria and insurance authorization.  Follow Up Recommendations  Home health PT     Assistance Recommended at Discharge Intermittent Supervision/Assistance  Patient can return home with the following A little help with walking and/or transfers;A little help with bathing/dressing/bathroom;Help with stairs or ramp for entrance;Assist for transportation;Assistance with cooking/housework   Equipment Recommendations  Rolling walker (2 wheels)    Recommendations for Other Services       Precautions / Restrictions Precautions Precautions: Fall Precaution Comments: No BP L UE Restrictions Weight Bearing Restrictions: No     Mobility  Bed Mobility Overal bed mobility: Modified Independent Bed Mobility: Supine to Sit      Supine to sit: Modified independent (Device/Increase time)     General bed mobility comments: increased time/effort with transition through R sidelying; heavy use of UEs for support    Transfers Overall transfer level: Needs assistance Equipment used: Rolling walker (2 wheels) Transfers: Sit to/from Stand Sit to Stand: Min assist, Min guard Stand pivot transfers: Min guard, Min assist              Ambulation/Gait Ambulation/Gait assistance: Min guard, Min assist Gait Distance (Feet):  (25' x1, 20' x1) Assistive device: Rolling walker (2 wheels)         General Gait Details: mild forward trunk flexion with min/mod WBing bilat UEs to stabilize self with RW.  Slow and effortful, notably fatigued with minimal distance/effort, but motivated to improve as able. BORG 10/10 after distance, sats >90% on RA.  Extended recovery time between trials.   Stairs             Wheelchair Mobility    Modified Rankin (Stroke Patients Only)       Balance Overall balance assessment: Needs assistance Sitting-balance support: No upper extremity supported, Feet supported Sitting balance-Leahy Scale: Good     Standing balance support: Bilateral upper extremity supported Standing balance-Leahy Scale: Fair                              Cognition Arousal/Alertness: Awake/alert Behavior During Therapy: WFL for tasks assessed/performed Overall Cognitive Status: Within Functional Limits for tasks assessed  Exercises Other Exercises Other Exercises: Toilet transfer, ambulatory with RW, cga/min assist; sit/stand from standard toilet height, min assist (due to lower surface height).    General Comments        Pertinent Vitals/Pain Pain Assessment Pain Assessment: No/denies pain    Home Living                          Prior Function            PT Goals (current goals can now be found in the  care plan section) Acute Rehab PT Goals Patient Stated Goal: to get better and go home PT Goal Formulation: With patient Time For Goal Achievement: 11/22/21 Potential to Achieve Goals: Fair Progress towards PT goals: Progressing toward goals    Frequency    Min 2X/week      PT Plan Current plan remains appropriate    Co-evaluation              AM-PAC PT "6 Clicks" Mobility   Outcome Measure  Help needed turning from your back to your side while in a flat bed without using bedrails?: None Help needed moving from lying on your back to sitting on the side of a flat bed without using bedrails?: A Little Help needed moving to and from a bed to a chair (including a wheelchair)?: A Little Help needed standing up from a chair using your arms (e.g., wheelchair or bedside chair)?: A Little Help needed to walk in hospital room?: A Little Help needed climbing 3-5 steps with a railing? : A Lot 6 Click Score: 18    End of Session Equipment Utilized During Treatment: Gait belt Activity Tolerance: Patient tolerated treatment well;Patient limited by fatigue Patient left: in chair;with call bell/phone within reach;with family/visitor present Nurse Communication: Mobility status PT Visit Diagnosis: Unsteadiness on feet (R26.81);Muscle weakness (generalized) (M62.81)     Time: 9373-4287 PT Time Calculation (min) (ACUTE ONLY): 32 min  Charges:  $Gait Training: 8-22 mins $Therapeutic Activity: 8-22 mins                     Danna Casella H. Owens Shark, PT, DPT, NCS 11/12/21, 5:16 PM [phone]

## 2021-11-12 NOTE — TOC Benefit Eligibility Note (Signed)
Patient Research scientist (life sciences) completed.     The patient is currently admitted and upon discharge could be taking Eliquis.   The current 30 day co-pay is, $47.   The patient is insured through [employer].

## 2021-11-12 NOTE — Progress Notes (Signed)
PROGRESS NOTE    Teresa Cook  NWG:[id number] DOB: 03/30/1955 DOA: 11/05/2021 PCP: [facility]    Brief Narrative:  66 y.o female with PMH significant for ESRD on HD, A. Fib & DVT on Warfarin , admitted with Hemorrhagic shock due to right renal hemorrhage with large hematoma in setting of supratherapeutic INR (INR >9).  Vascular Surgery consulted, required coil embolization of the right renal artery.    66 year old female with a past medical history significant for ESRD on hemodialysis with chronic PermCath, CHF, DVT, cerebral meningioma who presents to [facility] ED on 11/05/2021 status post fall and loss of consciousness.  Patient reports she lives alone and passed out at least 4 times overnight, and when going to the bathroom around 5 AM this morning,  she passed out again it and thinks she hit her head.  Following the fall she also reports development of back/flank pain that radiates around to the right lower quadrant.   She reports she has been compliant with her hemodialysis, last session on Friday.  She denies chest pain, shortness of breath.  She reports compliance with her warfarin for anticoagulation.  10/30: Presented to ED, found to have hemorrhagic shock due to right renal hemorrhage.  Coumadin reversed with Kcentra and Vitamin K.  2 units of FFP and 2 unit of pRBCs administered.  Vascular Surgery performed embolization of right renal artery 10/31: Weaned off pressors, lactic normal. New thrombocytopenia, DIC workup negative.  Continues to complain of significant right sided abdominal/flank pain, will repeat CT Abdomen & Pelvis.  Transfuse 1 unit pRBCs for Hgb 6.5 10/31: [place] Bilateral Carotid revealed right carotid artery        system: Less than 50% stenosis secondary to mild multifocal        atherosclerotic plaque formation. Left carotid artery system:        Less than 50% stenosis secondary to mild multifocal        atherosclerotic plaque formation.  Vertebral artery system:        Patent with antegrade flow bilaterally 11/1: Pt remains on amiodarone gtt and precedex gtt.  States pain is better controlled.  In sinus rhythm rate controlled.  During HD session pt developed atrial fibrillation with rvr and hypotension requiring neo-synephrine gtt.  Required 1 unit pRBC due to hgb 6.9 post transfusion hgb 8.0 no signs of bleeding  11/2: Pt remains on amiodarone gtt, neo-synephrine gtt '@20'$  mcg/min, and precedex gtt '@0'$ .7 mcg/kg/hr.   11/3: DNR/DNI status, off pressors 11/4: Care assumed by [facility] hospitalist service.  Neo-Synephrine and Precedex weaned off.  Feels better this morning. 11/5: No acute events overnight.  Patient feels well this morning.   Assessment & Plan:   Principal Problem:   Shock ([place]) Active Problems:   Acute respiratory failure with hypoxia ([place])   ESRD on dialysis ([place])   End-stage renal disease on hemodialysis ([facility])   HTN (hypertension)   PAF (paroxysmal atrial fibrillation) (H[facility])   Hypotension   Anemia due to acute blood loss   Right flank pain   Retroperitoneal bleed   Altered mental status   Elevated lactic acid level   Atrial fibrillation with rapid ventricular response ([facility])  Hemorrhagic shock in the setting of right renal hemorrhage Supratherapeutic INR status post Coumadin reversal agent Acute blood loss anemia Patient required coil embolization of right renal artery by vascular surgery.  Hemoglobin continues to drift but overall stable.  INR 1.5.  Shock resolved.  Neo-Synephrine discontinued. Hemoglobin appears to be  stablizing. 8.8 on11/6 Plan: Trend hemoglobin and monitor for any bleeding Hold Coumadin May be able to heparin challenge within the next 24 hours  Ileus Patient had worsening of abdominal distention noted on 11/3.  KUB revealed gaseous distention consistent with ileus.  No signs of obstruction or free air under the diaphragm.  Patient has a relatively soft abdomen and good bowel sounds.   Continue bowel regimen.  Mobilize as tolerated.  Monitor for BM  Atrial tachycardia/PNSVT/atrial fibrillation with rapid ventricular response Rate control improved Plan: Continue PO amio Consider heparin challenge   ESRD on HD Nephrology following for inpatient hemodialysis needs  Heart failure with preserved ejection fraction LVEF 50 to 16%, grade 1 diastolic dysfunction Nephrology for fluid removal Daily weights, strict I's and O's    DVT prophylaxis: SCD Code Status: DNR Family Communication: Daughter via phone 11/5 Disposition Plan: Status is: Inpatient Remains inpatient appropriate because: Multiple acute issues requiring continued hospitalization   Level of care: Progressive  Consultants:  Cardiology Palliative care  Procedures:  Coil embolization  Antimicrobials:  Subjective: Seen and examined.  Feeling better this morning.  Objective: Vitals:   11/12/21 0024 11/12/21 0443 11/12/21 0801 11/12/21 1207  BP: 126/84 (!) 144/87 128/71 124/74  Pulse: 92 84 81 85  Resp: '16 16 18 18  '$ Temp: 98.4 F (36.9 C) 98.3 F (36.8 C) 97.8 F (36.6 C) 98 F (36.7 C)  TempSrc: Oral  Oral Oral  SpO2: 100% 99% 94% 91%  Weight:      Height:        Intake/Output Summary (Last 24 hours) at 11/12/2021 1210 Last data filed at 11/12/2021 0550 Gross per 24 hour  Intake 359.88 ml  Output --  Net 359.88 ml   Filed Weights   11/09/21 0427 11/10/21 0500 11/10/21 1433  Weight: 59.7 kg 61.7 kg 61.9 kg    Examination: ` General exam: NAD Respiratory system: Crackles at bases.  Normal WOB. 2L Cardiovascular system: S1-S2, RRR, no murmurs, no pedal edema Gastrointestinal system: Soft, nondistended, mild TTP, normal bowel sounds. Central nervous system: Alert and oriented. No focal neurological deficits. Extremities: Symmetrically decreased power bilaterally Skin: No rashes, lesions or ulcers Psychiatry: Judgement and insight appear normal. Mood & affect flattened.      Data Reviewed: I have personally reviewed following labs and imaging studies  CBC: Recent Labs  Lab 11/09/21 0515 11/09/21 1254 11/09/21 1643 11/10/21 0440 11/11/21 0528 11/12/21 0452  WBC 11.3*  --  10.3 8.8 6.6 7.2  NEUTROABS 9.2*  --  8.2* 6.5 4.6 4.9  HGB 9.2* 9.3* 9.2* 8.5* 8.2* 8.8*  HCT 26.6* 26.8* 26.8* 24.4* 22.9* 25.1*  MCV 81.3  --  81.5 80.5 78.7* 79.4*  PLT 122*  --  145* 144* 147* 606   Basic Metabolic Panel: Recent Labs  Lab 11/06/21 0436 11/06/21 1526 11/07/21 0512 11/08/21 0216 11/09/21 0515 11/10/21 0440 11/11/21 0528 11/12/21 0452  NA 138  --  135 133* 135 131*  --   --   K 4.6   < > 5.2* 4.0 4.0 3.7 3.4* 3.9  CL 100  --  99 96* 93* 91*  --   --   CO2 26  --  '24 28 29 27  '$ --   --   GLUCOSE 105*  --  97 116* 106* 106*  --   --   BUN 49*  --  63* 34* 25* 40*  --   --   CREATININE 8.25*  --  10.10* 5.91* 4.88*  6.75*  --   --   CALCIUM 8.3*  --  8.8* 9.3 9.5 10.0  --   --   MG 1.8   < > 1.9 1.7 2.2 2.2 1.9 2.0  PHOS 7.1*  --  8.6* 5.0* 5.4* 6.3* 4.3 4.8*   < > = values in this interval not displayed.   GFR: Estimated Creatinine Clearance: 7.3 mL/min (A) (by C-G formula based on SCr of 6.75 mg/dL (H)). Liver Function Tests: Recent Labs  Lab 11/07/21 0512  ALBUMIN 2.8*   No results for input(s): "LIPASE", "AMYLASE" in the last 168 hours. No results for input(s): "AMMONIA" in the last 168 hours. Coagulation Profile: Recent Labs  Lab 11/06/21 0436 11/07/21 0512 11/08/21 1838 11/10/21 1625  INR 1.4* 1.5* 1.5* 1.3*   Cardiac Enzymes: No results for input(s): "CKTOTAL", "CKMB", "CKMBINDEX", "TROPONINI" in the last 168 hours. BNP (last 3 results) No results for input(s): "PROBNP" in the last 8760 hours. HbA1C: No results for input(s): "HGBA1C" in the last 72 hours. CBG: Recent Labs  Lab 11/11/21 1507 11/11/21 1919 11/12/21 0108 11/12/21 0448 11/12/21 0849  GLUCAP 113* 106* 89 87 86   Lipid Profile: No results for input(s):  "CHOL", "HDL", "LDLCALC", "TRIG", "CHOLHDL", "LDLDIRECT" in the last 72 hours. Thyroid Function Tests: No results for input(s): "TSH", "T4TOTAL", "FREET4", "T3FREE", "THYROIDAB" in the last 72 hours. Anemia Panel: No results for input(s): "VITAMINB12", "FOLATE", "FERRITIN", "TIBC", "IRON", "RETICCTPCT" in the last 72 hours. Sepsis Labs: Recent Labs  Lab 11/05/21 1322 11/05/21 2050 11/06/21 0436 11/06/21 0821 11/10/21 0817  PROCALCITON  --   --  2.08  --  3.96  LATICACIDVEN 1.5 1.2  --  1.2  --     Recent Results (from the past 240 hour(s))  Blood Culture (routine x 2)     Status: None   Collection Time: 11/05/21  3:20 AM   Specimen: BLOOD  Result Value Ref Range Status   Specimen Description BLOOD RIGHT FA  Final   Special Requests   Final    BOTTLES DRAWN AEROBIC AND ANAEROBIC Blood Culture results may not be optimal due to an inadequate volume of blood received in culture bottles   Culture   Final    NO GROWTH 5 DAYS Performed at [facility], [address] [place], [place] [address]    Report Status 11/10/2021 FINAL  Final  SARS Coronavirus 2 by RT PCR (hospital order, performed in [facility] hospital lab) *cepheid single result test* Anterior Nasal Swab     Status: None   Collection Time: 11/05/21  3:20 AM   Specimen: Anterior Nasal Swab  Result Value Ref Range Status   SARS Coronavirus 2 by RT PCR NEGATIVE NEGATIVE Final    Comment: (NOTE) SARS-CoV-2 target nucleic acids are NOT DETECTED.  The SARS-CoV-2 RNA is generally detectable in upper and lower respiratory specimens during the acute phase of infection. The lowest concentration of SARS-CoV-2 viral copies this assay can detect is 250 copies / mL. A negative result does not preclude SARS-CoV-2 infection and should not be used as the sole basis for treatment or other patient management decisions.  A negative result may occur with improper specimen collection / handling, submission of specimen  other than nasopharyngeal swab, presence of viral mutation(s) within the areas targeted by this assay, and inadequate number of viral copies (<250 copies / mL). A negative result must be combined with clinical observations, patient history, and epidemiological information.  Fact Sheet for Patients:   https://www.patel.info/  Fact Sheet for Healthcare Providers: https://hall.com/  This test is not yet approved or  cleared by the [place] FDA and has been authorized for detection and/or diagnosis of SARS-CoV-2 by FDA under an Emergency Use Authorization (EUA).  This EUA will remain in effect (meaning this test can be used) for the duration of the COVID-19 declaration under Section 564(b)(1) of the Act, 21 U.S.C. section 360bbb-3(b)(1), unless the authorization is terminated or revoked sooner.  Performed at [facility], [address] [place], [place] [address]   Blood Culture (routine x 2)     Status: None   Collection Time: 11/05/21  3:21 AM   Specimen: BLOOD  Result Value Ref Range Status   Specimen Description BLOOD RIGHT HAND  Final   Special Requests   Final    BOTTLES DRAWN AEROBIC AND ANAEROBIC Blood Culture results may not be optimal due to an inadequate volume of blood received in culture bottles   Culture   Final    NO GROWTH 5 DAYS Performed at [facility], [address], [place], [place] [address]    Report Status 11/10/2021 FINAL  Final  MRSA Next Gen by PCR, Nasal     Status: None   Collection Time: 11/05/21  6:57 AM   Specimen: Nasal Mucosa; Nasal Swab  Result Value Ref Range Status   MRSA by PCR Next Gen NOT DETECTED NOT DETECTED Final    Comment: (NOTE) The GeneXpert MRSA Assay (FDA approved for NASAL specimens only), is one component of a comprehensive MRSA colonization surveillance program. It is not intended to diagnose MRSA infection nor to guide or monitor treatment for MRSA  infections. Test performance is not FDA approved in patients less than 38 years old. Performed at [facility], [address] [place], [place] [address]          Radiology Studies: No results found.      Scheduled Meds:  amiodarone  200 mg Oral BID   Chlorhexidine Gluconate Cloth  6 each Topical Q0600   feeding supplement (NEPRO CARB STEADY)  237 mL Oral TID BM   fluticasone  2 spray Each Nare Daily   metoprolol tartrate  2.5 mg Intravenous Q6H   multivitamin  1 tablet Oral QHS   polyethylene glycol  17 g Oral Daily   senna-docusate  1 tablet Oral BID   Continuous Infusions:  sodium chloride Stopped (11/10/21 1349)   anticoagulant sodium citrate     piperacillin-tazobactam (ZOSYN)  IV Stopped (11/12/21 0536)     LOS: 7 days      Sidney Ace, MD [facility]   If 7PM-7AM, please contact night-coverage  11/12/2021, 12:10 PM

## 2021-11-12 NOTE — Telephone Encounter (Signed)
Pharmacy Patient Advocate Encounter  Insurance verification completed.    The patient is insured through [employer]   The patient is currently admitted and ran test claims for the following: Eliquis.  Copays and coinsurance results were relayed to Inpatient clinical team.   

## 2021-11-13 DIAGNOSIS — R579 Shock, unspecified: Secondary | ICD-10-CM | POA: Diagnosis not present

## 2021-11-13 LAB — PROTIME-INR
INR: 1.4 — ABNORMAL HIGH (ref 0.8–1.2)
Prothrombin Time: 16.8 seconds — ABNORMAL HIGH (ref 11.4–15.2)

## 2021-11-13 LAB — APTT: aPTT: 40 seconds — ABNORMAL HIGH (ref 24–36)

## 2021-11-13 LAB — CBC WITH DIFFERENTIAL/PLATELET
Abs Immature Granulocytes: 0.14 10*3/uL — ABNORMAL HIGH (ref 0.00–0.07)
Basophils Absolute: 0 10*3/uL (ref 0.0–0.1)
Basophils Relative: 0 %
Eosinophils Absolute: 0.2 10*3/uL (ref 0.0–0.5)
Eosinophils Relative: 3 %
HCT: 23 % — ABNORMAL LOW (ref 36.0–46.0)
Hemoglobin: 8.1 g/dL — ABNORMAL LOW (ref 12.0–15.0)
Immature Granulocytes: 2 %
Lymphocytes Relative: 12 %
Lymphs Abs: 0.9 10*3/uL (ref 0.7–4.0)
MCH: 27.5 pg (ref 26.0–34.0)
MCHC: 35.2 g/dL (ref 30.0–36.0)
MCV: 78 fL — ABNORMAL LOW (ref 80.0–100.0)
Monocytes Absolute: 1.1 10*3/uL — ABNORMAL HIGH (ref 0.1–1.0)
Monocytes Relative: 15 %
Neutro Abs: 4.8 10*3/uL (ref 1.7–7.7)
Neutrophils Relative %: 68 %
Platelets: 193 10*3/uL (ref 150–400)
RBC: 2.95 MIL/uL — ABNORMAL LOW (ref 3.87–5.11)
RDW: 16 % — ABNORMAL HIGH (ref 11.5–15.5)
WBC: 7 10*3/uL (ref 4.0–10.5)
nRBC: 0 % (ref 0.0–0.2)

## 2021-11-13 LAB — GLUCOSE, CAPILLARY
Glucose-Capillary: 100 mg/dL — ABNORMAL HIGH (ref 70–99)
Glucose-Capillary: 84 mg/dL (ref 70–99)
Glucose-Capillary: 107 mg/dL — ABNORMAL HIGH (ref 70–99)
Glucose-Capillary: 104 mg/dL — ABNORMAL HIGH (ref 70–99)

## 2021-11-13 LAB — PHOSPHORUS: Phosphorus: 4.2 mg/dL (ref 2.5–4.6)

## 2021-11-13 LAB — HEPARIN LEVEL (UNFRACTIONATED)
Heparin Unfractionated: <0.1 IU/mL — ABNORMAL LOW (ref 0.30–0.70)
Heparin Unfractionated: <0.1 IU/mL — ABNORMAL LOW (ref 0.30–0.70)

## 2021-11-13 LAB — MAGNESIUM: Magnesium: 1.9 mg/dL (ref 1.7–2.4)

## 2021-11-13 MED ORDER — METOPROLOL TARTRATE 5 MG/5ML IV SOLN: 2.5000 mg | Freq: Four times a day (QID) | INTRAVENOUS | Status: DC | PRN

## 2021-11-13 MED ORDER — SERTRALINE HCL 50 MG PO TABS
100.0000 mg | ORAL_TABLET | Freq: Every day | ORAL | Status: DC
  Administered 2021-11-13 – 2021-11-22 (×10): 100 mg via ORAL
  Filled 2021-11-13 (×10): qty 2

## 2021-11-13 MED ORDER — HEPARIN (PORCINE) 25000 UT/250ML-% IV SOLN
1300.0000 [IU]/h | INTRAVENOUS | Status: DC
  Administered 2021-11-13: 850 [IU]/h via INTRAVENOUS
  Filled 2021-11-13 (×3): qty 250

## 2021-11-13 MED ORDER — CARVEDILOL 12.5 MG PO TABS
12.5000 mg | ORAL_TABLET | Freq: Two times a day (BID) | ORAL | Status: DC
  Administered 2021-11-13 – 2021-11-22 (×14): 12.5 mg via ORAL
  Filled 2021-11-13 (×14): qty 1

## 2021-11-13 MED ORDER — SEVELAMER CARBONATE 800 MG PO TABS
2400.0000 mg | ORAL_TABLET | Freq: Three times a day (TID) | ORAL | Status: DC
  Administered 2021-11-14 – 2021-11-16 (×5): 2400 mg via ORAL
  Administered 2021-11-17: 1600 mg via ORAL
  Administered 2021-11-18 – 2021-11-20 (×3): 2400 mg via ORAL
  Filled 2021-11-13 (×18): qty 3

## 2021-11-13 MED ORDER — TRAZODONE HCL 50 MG PO TABS
50.0000 mg | ORAL_TABLET | Freq: Every day | ORAL | Status: DC
  Administered 2021-11-13 – 2021-11-21 (×9): 50 mg via ORAL
  Filled 2021-11-13 (×9): qty 1

## 2021-11-13 MED ORDER — CINACALCET HCL 30 MG PO TABS
60.0000 mg | ORAL_TABLET | Freq: Every day | ORAL | Status: DC
  Administered 2021-11-14 – 2021-11-22 (×6): 60 mg via ORAL
  Filled 2021-11-13 (×11): qty 2

## 2021-11-13 NOTE — Progress Notes (Signed)
[facility]  ROUNDING NOTE   Subjective:   Teresa Cook is a 66 year old African-American female with past medical conditions including hypertension, gout, anxiety with depression, sickle cell trait, systolic heart failure with a EF 40 to 45%, breast cancer with bilateral mastectomy, seizures, and end-stage renal disease on hemodialysis.  Patient presents to the emergency department via EMS for multiple syncopal episodes.  Patient has been admitted for Right flank pain [R10.9] Elevated lactic acid level [R79.89] Hypotension [I95.9] Hypotension, unspecified hypotension type [I95.9] Altered mental status, unspecified altered mental status type [R41.82]  Patient is known to our practice and receives outpatient dialysis treatments at [facility] on a MWF schedule, supervised by Dr. Candiss Norse.    Patient states she doesn't feel as well as yesterday Appetite poor today Complains of nausea  Objective:  Vital signs in last 24 hours:  Temp:  [98.2 F (36.8 C)-99.3 F (37.4 C)] 98.4 F (36.9 C) (11/07 1201) Pulse Rate:  [85-94] 88 (11/07 1201) Resp:  [17-31] 17 (11/07 1201) BP: (126-149)/(76-94) 127/82 (11/07 1201) SpO2:  [93 %-100 %] 99 % (11/07 1201) Weight:  [60.2 kg-61.5 kg] 60.2 kg (11/06 1811)  Weight change:  Filed Weights   11/10/21 1433 11/12/21 1512 11/12/21 1811  Weight: 61.9 kg 61.5 kg 60.2 kg    Intake/Output: I/O last 3 completed shifts: In: 100 [IV Piggyback:100] Out: 500 [Other:500]   Intake/Output this shift:  No intake/output data recorded.  Physical Exam: General: NAD  Head: Normocephalic, atraumatic. Moist oral mucosal membranes  Eyes: Anicteric  Lungs:  Clear to auscultation, normal effort, room air  Heart: Regular rate and rhythm  Abdomen:  Soft, nontender  Extremities:  No peripheral edema.  Neurologic: Nonfocal, moving all four extremities  Skin: No lesions  Access: Lt Permcath    Basic Metabolic Panel: Recent Labs  Lab  11/07/21 0512 11/08/21 0216 11/09/21 0515 11/10/21 0440 11/11/21 0528 11/12/21 0452 11/13/21 0635  NA 135 133* 135 131*  --   --   --   K 5.2* 4.0 4.0 3.7 3.4* 3.9  --   CL 99 96* 93* 91*  --   --   --   CO2 '24 28 29 27  '$ --   --   --   GLUCOSE 97 116* 106* 106*  --   --   --   BUN 63* 34* 25* 40*  --   --   --   CREATININE 10.10* 5.91* 4.88* 6.75*  --   --   --   CALCIUM 8.8* 9.3 9.5 10.0  --   --   --   MG 1.9 1.7 2.2 2.2 1.9 2.0 1.9  PHOS 8.6* 5.0* 5.4* 6.3* 4.3 4.8* 4.2     Liver Function Tests: Recent Labs  Lab 11/07/21 0512  ALBUMIN 2.8*    No results for input(s): "LIPASE", "AMYLASE" in the last 168 hours. No results for input(s): "AMMONIA" in the last 168 hours.  CBC: Recent Labs  Lab 11/09/21 1643 11/10/21 0440 11/11/21 0528 11/12/21 0452 11/13/21 0635  WBC 10.3 8.8 6.6 7.2 7.0  NEUTROABS 8.2* 6.5 4.6 4.9 4.8  HGB 9.2* 8.5* 8.2* 8.8* 8.1*  HCT 26.8* 24.4* 22.9* 25.1* 23.0*  MCV 81.5 80.5 78.7* 79.4* 78.0*  PLT 145* 144* 147* 186 193     Cardiac Enzymes: No results for input(s): "CKTOTAL", "CKMB", "CKMBINDEX", "TROPONINI" in the last 168 hours.  BNP: Invalid input(s): "POCBNP"  CBG: Recent Labs  Lab 11/12/21 1209 11/12/21 1958 11/13/21 0512 11/13/21 5400  11/13/21 1201  GLUCAP 134* 100* 100* 71 107*     Microbiology: Results for orders placed or performed during the hospital encounter of 11/05/21  Blood Culture (routine x 2)     Status: None   Collection Time: 11/05/21  3:20 AM   Specimen: BLOOD  Result Value Ref Range Status   Specimen Description BLOOD RIGHT FA  Final   Special Requests   Final    BOTTLES DRAWN AEROBIC AND ANAEROBIC Blood Culture results may not be optimal due to an inadequate volume of blood received in culture bottles   Culture   Final    NO GROWTH 5 DAYS Performed at [facility] : [facility], [address] [place], [place] [address]    Report Status 11/10/2021 FINAL  Final  SARS Coronavirus 2 by RT PCR  (hospital order, performed in [facility] hospital lab) *cepheid single result test* Anterior Nasal Swab     Status: None   Collection Time: 11/05/21  3:20 AM   Specimen: Anterior Nasal Swab  Result Value Ref Range Status   SARS Coronavirus 2 by RT PCR NEGATIVE NEGATIVE Final    Comment: (NOTE) SARS-CoV-2 target nucleic acids are NOT DETECTED.  The SARS-CoV-2 RNA is generally detectable in upper and lower respiratory specimens during the acute phase of infection. The lowest concentration of SARS-CoV-2 viral copies this assay can detect is 250 copies / mL. A negative result does not preclude SARS-CoV-2 infection and should not be used as the sole basis for treatment or other patient management decisions.  A negative result may occur with improper specimen collection / handling, submission of specimen other than nasopharyngeal swab, presence of viral mutation(s) within the areas targeted by this assay, and inadequate number of viral copies (<250 copies / mL). A negative result must be combined with clinical observations, patient history, and epidemiological information.  Fact Sheet for Patients:   https://www.patel.info/  Fact Sheet for Healthcare Providers: https://hall.com/  This test is not yet approved or  cleared by the [place] FDA and has been authorized for detection and/or diagnosis of SARS-CoV-2 by FDA under an Emergency Use Authorization (EUA).  This EUA will remain in effect (meaning this test can be used) for the duration of the COVID-19 declaration under Section 564(b)(1) of the Act, 21 U.S.C. section 360bbb-3(b)(1), unless the authorization is terminated or revoked sooner.  Performed at [facility], [address] [place], [place] [address]   Blood Culture (routine x 2)     Status: None   Collection Time: 11/05/21  3:21 AM   Specimen: BLOOD  Result Value Ref Range Status   Specimen Description BLOOD RIGHT  HAND  Final   Special Requests   Final    BOTTLES DRAWN AEROBIC AND ANAEROBIC Blood Culture results may not be optimal due to an inadequate volume of blood received in culture bottles   Culture   Final    NO GROWTH 5 DAYS Performed at [facility], [address], [place], [place] [address]    Report Status 11/10/2021 FINAL  Final  MRSA Next Gen by PCR, Nasal     Status: None   Collection Time: 11/05/21  6:57 AM   Specimen: Nasal Mucosa; Nasal Swab  Result Value Ref Range Status   MRSA by PCR Next Gen NOT DETECTED NOT DETECTED Final    Comment: (NOTE) The GeneXpert MRSA Assay (FDA approved for NASAL specimens only), is one component of a comprehensive MRSA colonization surveillance program. It is not intended to  diagnose MRSA infection nor to guide or monitor treatment for MRSA infections. Test performance is not FDA approved in patients less than 96 years old. Performed at [facility], [address] [place], [place] [address]     Coagulation Studies: Recent Labs    11/10/21 1625 11/13/21 1135  LABPROT 16.4* 16.8*  INR 1.3* 1.4*     Urinalysis: No results for input(s): "COLORURINE", "LABSPEC", "PHURINE", "GLUCOSEU", "HGBUR", "BILIRUBINUR", "KETONESUR", "PROTEINUR", "UROBILINOGEN", "NITRITE", "LEUKOCYTESUR" in the last 72 hours.  Invalid input(s): "APPERANCEUR"    Imaging: No results found.   Medications:    sodium chloride 250 mL (11/12/21 2214)   anticoagulant sodium citrate     heparin 850 Units/hr (11/13/21 1147)    amiodarone  200 mg Oral BID   carvedilol  12.5 mg Oral BID WC   Chlorhexidine Gluconate Cloth  6 each Topical Q0600   cinacalcet  60 mg Oral QAC lunch   feeding supplement (NEPRO CARB STEADY)  237 mL Oral TID BM   multivitamin  1 tablet Oral QHS   polyethylene glycol  17 g Oral Daily   senna-docusate  1 tablet Oral BID   sertraline  100 mg Oral Daily   sevelamer carbonate  2,400 mg Oral TID with meals   traZODone  50 mg  Oral QHS   acetaminophen, alteplase, anticoagulant sodium citrate, bisacodyl, diazepam, fluticasone, heparin, HYDROmorphone (DILAUDID) injection, lidocaine (PF), lidocaine-prilocaine, metoprolol tartrate, ondansetron (ZOFRAN) IV, mouth rinse, pentafluoroprop-tetrafluoroeth, sodium chloride  Assessment/ Plan:  [name] is a 66 y.o.  female is a 66 year old African-American female with past medical conditions including hypertension, gout, anxiety with depression, sickle cell trait, systolic heart failure with a EF 40 to 45%, breast cancer with bilateral mastectomy, seizures, and end-stage renal disease on hemodialysis.  Patient presents to the emergency department via EMS for multiple syncopal episodes.  Patient has been admitted for Right flank pain [R10.9] Elevated lactic acid level [R79.89] Hypotension [I95.9] Hypotension, unspecified hypotension type [I95.9] Altered mental status, unspecified altered mental status type [R41.82]  [facility]/MWF/Lt Permcath  End-stage renal disease on hemodialysis.  Will maintain outpatient schedule if possible.  Dialysis received yesterday, UF 0.5L achieved. Next dialysis treatment scheduled for Wednesday.  2. Anemia of chronic kidney disease with acute blood loss Lab Results  Component Value Date   HGB 8.1 (L) 11/13/2021  CT angio abdomen pelvis shows severe hemorrhage at right kidney.  Appreciate vascular surgery performing right renal angiogram with coil embolization to manage hemorrhage.  Patient received 2 units FFP and blood transfusions thus far during admission.  Hgb stable. Monitoring for signs of bleeding   3. Secondary Hyperparathyroidism: with outpatient labs: PTH 379, phosphorus 5.7, calcium 9.4 on June 18, 2021.   Lab Results  Component Value Date   PTH 214 (H) 07/08/2021   CALCIUM 10.0 11/10/2021   PHOS 4.2 11/13/2021    Continue sevelamer with all meals.  4.  Hypotension likely due to blood loss, Currently  weaned off pressors.  Amiodarone twice daily  5.  Hyperkalemia, potassium corrected with dialysis   LOS: 8 Shmiel Morton 11/7/20231:34 PM

## 2021-11-13 NOTE — Consult Note (Signed)
ANTICOAGULATION CONSULT NOTE - Initial Consult  Pharmacy Consult for Heparin Infusion Indication: atrial fibrillation  Allergies  Allergen Reactions   Gabapentin Other (See Comments)    Seizure   Adhesive [Tape] Itching    Silk tape is ok to use.    Patient Measurements: Height: '5\' 5"'$  (165.1 cm) Weight: 60.2 kg (132 lb 11.5 oz) IBW/kg (Calculated) : 57 Heparin Dosing Weight: 60.2 kg  Vital Signs: Temp: 98.5 F (36.9 C) (11/07 0811) Temp Source: Oral (11/06 2323) BP: 143/94 (11/07 0811) Pulse Rate: 89 (11/07 0811)  Labs: Recent Labs    11/10/21 1625 11/11/21 0528 11/11/21 0528 11/12/21 0452 11/13/21 0635  HGB  --  8.2*   < > 8.8* 8.1*  HCT  --  22.9*  --  25.1* 23.0*  PLT  --  147*  --  186 193  LABPROT 16.4*  --   --   --   --   INR 1.3*  --   --   --   --    < > = values in this interval not displayed.    Estimated Creatinine Clearance: 7.3 mL/min (A) (by C-G formula based on SCr of 6.75 mg/dL (H)).   Medical History: Past Medical History:  Diagnosis Date   Anemia    Anxiety    Breast cancer ([place]) 01/2016   bilateral   CHF (congestive heart failure) ([facility])    Chronic kidney disease    Depression    Dialysis patient [facility])    DVT (deep venous thrombosis) ([facility])    left leg   DVT (deep venous thrombosis) ([place]) 1985   right thigh   Dysrhythmia    Gout    Headache    HTN (hypertension)    Hypertension    Parathyroid abnormality ([facility])    Parathyroid disease ([place])    Pneumonia 12/2015   Psoriasis    Renal insufficiency    Sickle cell trait ([facility])    traits    Medications:  Scheduled:   amiodarone  200 mg Oral BID   Chlorhexidine Gluconate Cloth  6 each Topical Q0600   feeding supplement (NEPRO CARB STEADY)  237 mL Oral TID BM   metoprolol tartrate  2.5 mg Intravenous Q6H   multivitamin  1 tablet Oral QHS   polyethylene glycol  17 g Oral Daily   senna-docusate  1 tablet Oral BID   Infusions:   sodium chloride 250 mL (11/12/21 2214)    anticoagulant sodium citrate     piperacillin-tazobactam (ZOSYN)  IV 2.25 g (11/13/21 0536)   PRN: acetaminophen, alteplase, anticoagulant sodium citrate, bisacodyl, diazepam, fluticasone, heparin, HYDROmorphone (DILAUDID) injection, lidocaine (PF), lidocaine-prilocaine, ondansetron (ZOFRAN) IV, mouth rinse, pentafluoroprop-tetrafluoroeth, sodium chloride  PTA Warfarin regimen: 5 mg MWFSaSu, 7.5 mg TuTh  Assessment: Teresa Cook is a 66 y.o. female presenting with hemorrhagic shock 2/2 R renal hemorrhage, INR 9.2, reversed with Kcentra and VitK, given 2 units FFP and 2 units PRBC. PMH significant for ESRD on HD, AFib & DVT on Warfarin. Patient was on [facility] PTA per chart review, last dose of warfarin on 10/30 in the morning. Per discussion with MD, will cautiously trial heparin for anticoagulation with intent to swap long term anticoagulation from warfarin to DOAC (pending cost). Pharmacy has been consulted to initiate and manage heparin infusion.   Baseline Labs: aPTT 40, PT 16.8, INR 1.4, Hgb 8.1, Hct 23.0, Plt 193   Goal of Therapy:  Heparin level 0.3-0.7 units/ml (no boluses, high bleed risk) Monitor platelets by anticoagulation  protocol: Yes   Plan:  No bolus per discussion with MD Start heparin infusion at 850 units/hr Check HL level in 8 hours  Continue to monitor H&H and platelets daily while on heparin  Gretel Acre, PharmD PGY1 Pharmacy Resident 11/13/2021 9:24 AM

## 2021-11-13 NOTE — Progress Notes (Signed)
PROGRESS NOTE    Teresa Cook  GHW:[id number] DOB: Mar 28, 1955 DOA: 11/05/2021 PCP: [facility]    Brief Narrative:  66 y.o female with PMH significant for ESRD on HD, A. Fib & DVT on Warfarin , admitted with Hemorrhagic shock due to right renal hemorrhage with large hematoma in setting of supratherapeutic INR (INR >9).  Vascular Surgery consulted, required coil embolization of the right renal artery.    66 year old female with a past medical history significant for ESRD on hemodialysis with chronic PermCath, CHF, DVT, cerebral meningioma who presents to [facility] ED on 11/05/2021 status post fall and loss of consciousness.  Patient reports she lives alone and passed out at least 4 times overnight, and when going to the bathroom around 5 AM this morning,  she passed out again it and thinks she hit her head.  Following the fall she also reports development of back/flank pain that radiates around to the right lower quadrant.   She reports she has been compliant with her hemodialysis, last session on Friday.  She denies chest pain, shortness of breath.  She reports compliance with her warfarin for anticoagulation.  10/30: Presented to ED, found to have hemorrhagic shock due to right renal hemorrhage.  Coumadin reversed with Kcentra and Vitamin K.  2 units of FFP and 2 unit of pRBCs administered.  Vascular Surgery performed embolization of right renal artery 10/31: Weaned off pressors, lactic normal. New thrombocytopenia, DIC workup negative.  Continues to complain of significant right sided abdominal/flank pain, will repeat CT Abdomen & Pelvis.  Transfuse 1 unit pRBCs for Hgb 6.5 10/31: [place] Bilateral Carotid revealed right carotid artery        system: Less than 50% stenosis secondary to mild multifocal        atherosclerotic plaque formation. Left carotid artery system:        Less than 50% stenosis secondary to mild multifocal        atherosclerotic plaque formation.  Vertebral artery system:        Patent with antegrade flow bilaterally 11/1: Pt remains on amiodarone gtt and precedex gtt.  States pain is better controlled.  In sinus rhythm rate controlled.  During HD session pt developed atrial fibrillation with rvr and hypotension requiring neo-synephrine gtt.  Required 1 unit pRBC due to hgb 6.9 post transfusion hgb 8.0 no signs of bleeding  11/2: Pt remains on amiodarone gtt, neo-synephrine gtt '@20'$  mcg/min, and precedex gtt '@0'$ .7 mcg/kg/hr.   11/3: DNR/DNI status, off pressors 11/4: Care assumed by [facility] hospitalist service.  Neo-Synephrine and Precedex weaned off.  Feels better this morning. 11/5: No acute events overnight.  Patient feels well this morning. 11/7: Will challenge with IV heparin today   Assessment & Plan:   Principal Problem:   Shock ([place]) Active Problems:   Acute respiratory failure with hypoxia (HCC)   ESRD on dialysis ([place])   End-stage renal disease on hemodialysis (HCC)   HTN (hypertension)   PAF (paroxysmal atrial fibrillation) (HCC)   Hypotension   Anemia due to acute blood loss   Right flank pain   Retroperitoneal bleed   Altered mental status   Elevated lactic acid level   Atrial fibrillation with rapid ventricular response (HCC)  Hemorrhagic shock in the setting of right renal hemorrhage Supratherapeutic INR status post Coumadin reversal agent Acute blood loss anemia Patient required coil embolization of right renal artery by vascular surgery.  Hemoglobin continues to drift but overall stable.  INR 1.5.  Shock resolved.  Neo-Synephrine discontinued. Hemoglobin appears to be stablizing Plan: Trend hemoglobin and monitor for any bleeding Hold Coumadin IV heparin challenge today If patient remains stable, can consider Eliquis tomorrow  Ileus Patient had worsening of abdominal distention noted on 11/3.  KUB revealed gaseous distention consistent with ileus.  No signs of obstruction or free air under the diaphragm.   Patient has a relatively soft abdomen and good bowel sounds.  Continue bowel regimen.  Therapy as tolerated  Atrial tachycardia/PNSVT/atrial fibrillation with rapid ventricular response Rate control improved Plan: Continue PO amio Heparin challenge as above  ESRD on HD Nephrology following for inpatient hemodialysis needs  Heart failure with preserved ejection fraction LVEF 50 to 17%, grade 1 diastolic dysfunction Nephrology for fluid removal Daily weights, strict I's and O's    DVT prophylaxis: IV heparin Code Status: DNR Family Communication: Daughter via phone 11/5, 11/7 Disposition Plan: Status is: Inpatient Remains inpatient appropriate because: Multiple acute issues requiring continued hospitalization   Level of care: Progressive  Consultants:  Cardiology Palliative care Nephrology  Procedures:  Coil embolization  Antimicrobials:  Subjective: Seen and examined.  NAD  Objective: Vitals:   11/12/21 2323 11/13/21 0508 11/13/21 0811 11/13/21 1201  BP: 126/85 130/76 (!) 143/94 127/82  Pulse: 94 85 89 88  Resp: '20 20 18 17  '$ Temp: 98.9 F (37.2 C) 98.5 F (36.9 C) 98.5 F (36.9 C) 98.4 F (36.9 C)  TempSrc: Oral     SpO2: 93% 100% 100% 99%  Weight:      Height:        Intake/Output Summary (Last 24 hours) at 11/13/2021 1327 Last data filed at 11/12/2021 1759 Gross per 24 hour  Intake --  Output 500 ml  Net -500 ml   Filed Weights   11/10/21 1433 11/12/21 1512 11/12/21 1811  Weight: 61.9 kg 61.5 kg 60.2 kg    Examination: ` General exam: No acute distress.  Appears fatigued Respiratory system: Bibasilar crackles.  Normal WOB. 2L Cardiovascular system: S1-S2, RRR, no murmurs, no pedal edema Gastrointestinal system: Soft, nondistended, mild TTP, normal bowel sounds. Central nervous system: Alert and oriented. No focal neurological deficits. Extremities: Symmetrically decreased power bilaterally Skin: No rashes, lesions or ulcers Psychiatry:  Judgement and insight appear normal. Mood & affect flattened.     Data Reviewed: I have personally reviewed following labs and imaging studies  CBC: Recent Labs  Lab 11/09/21 1643 11/10/21 0440 11/11/21 0528 11/12/21 0452 11/13/21 0635  WBC 10.3 8.8 6.6 7.2 7.0  NEUTROABS 8.2* 6.5 4.6 4.9 4.8  HGB 9.2* 8.5* 8.2* 8.8* 8.1*  HCT 26.8* 24.4* 22.9* 25.1* 23.0*  MCV 81.5 80.5 78.7* 79.4* 78.0*  PLT 145* 144* 147* 186 510   Basic Metabolic Panel: Recent Labs  Lab 11/07/21 0512 11/08/21 0216 11/09/21 0515 11/10/21 0440 11/11/21 0528 11/12/21 0452 11/13/21 0635  NA 135 133* 135 131*  --   --   --   K 5.2* 4.0 4.0 3.7 3.4* 3.9  --   CL 99 96* 93* 91*  --   --   --   CO2 '24 28 29 27  '$ --   --   --   GLUCOSE 97 116* 106* 106*  --   --   --   BUN 63* 34* 25* 40*  --   --   --   CREATININE 10.10* 5.91* 4.88* 6.75*  --   --   --   CALCIUM 8.8* 9.3 9.5 10.0  --   --   --  MG 1.9 1.7 2.2 2.2 1.9 2.0 1.9  PHOS 8.6* 5.0* 5.4* 6.3* 4.3 4.8* 4.2   GFR: Estimated Creatinine Clearance: 7.3 mL/min (A) (by C-G formula based on SCr of 6.75 mg/dL (H)). Liver Function Tests: Recent Labs  Lab 11/07/21 0512  ALBUMIN 2.8*   No results for input(s): "LIPASE", "AMYLASE" in the last 168 hours. No results for input(s): "AMMONIA" in the last 168 hours. Coagulation Profile: Recent Labs  Lab 11/07/21 0512 11/08/21 1838 11/10/21 1625 11/13/21 1135  INR 1.5* 1.5* 1.3* 1.4*   Cardiac Enzymes: No results for input(s): "CKTOTAL", "CKMB", "CKMBINDEX", "TROPONINI" in the last 168 hours. BNP (last 3 results) No results for input(s): "PROBNP" in the last 8760 hours. HbA1C: No results for input(s): "HGBA1C" in the last 72 hours. CBG: Recent Labs  Lab 11/12/21 1209 11/12/21 1958 11/13/21 0512 11/13/21 0811 11/13/21 1201  GLUCAP 134* 100* 100* 84 107*   Lipid Profile: No results for input(s): "CHOL", "HDL", "LDLCALC", "TRIG", "CHOLHDL", "LDLDIRECT" in the last 72 hours. Thyroid Function  Tests: No results for input(s): "TSH", "T4TOTAL", "FREET4", "T3FREE", "THYROIDAB" in the last 72 hours. Anemia Panel: No results for input(s): "VITAMINB12", "FOLATE", "FERRITIN", "TIBC", "IRON", "RETICCTPCT" in the last 72 hours. Sepsis Labs: Recent Labs  Lab 11/10/21 0817  PROCALCITON 3.96    Recent Results (from the past 240 hour(s))  Blood Culture (routine x 2)     Status: None   Collection Time: 11/05/21  3:20 AM   Specimen: BLOOD  Result Value Ref Range Status   Specimen Description BLOOD RIGHT FA  Final   Special Requests   Final    BOTTLES DRAWN AEROBIC AND ANAEROBIC Blood Culture results may not be optimal due to an inadequate volume of blood received in culture bottles   Culture   Final    NO GROWTH 5 DAYS Performed at [facility], [address] [place], [place] [address]    Report Status 11/10/2021 FINAL  Final  SARS Coronavirus 2 by RT PCR (hospital order, performed in [facility] hospital lab) *cepheid single result test* Anterior Nasal Swab     Status: None   Collection Time: 11/05/21  3:20 AM   Specimen: Anterior Nasal Swab  Result Value Ref Range Status   SARS Coronavirus 2 by RT PCR NEGATIVE NEGATIVE Final    Comment: (NOTE) SARS-CoV-2 target nucleic acids are NOT DETECTED.  The SARS-CoV-2 RNA is generally detectable in upper and lower respiratory specimens during the acute phase of infection. The lowest concentration of SARS-CoV-2 viral copies this assay can detect is 250 copies / mL. A negative result does not preclude SARS-CoV-2 infection and should not be used as the sole basis for treatment or other patient management decisions.  A negative result may occur with improper specimen collection / handling, submission of specimen other than nasopharyngeal swab, presence of viral mutation(s) within the areas targeted by this assay, and inadequate number of viral copies (<250 copies / mL). A negative result must be combined with  clinical observations, patient history, and epidemiological information.  Fact Sheet for Patients:   https://www.patel.info/  Fact Sheet for Healthcare Providers: https://hall.com/  This test is not yet approved or  cleared by the [place] FDA and has been authorized for detection and/or diagnosis of SARS-CoV-2 by FDA under an Emergency Use Authorization (EUA).  This EUA will remain in effect (meaning this test can be used) for the duration of the COVID-19 declaration under Section 564(b)(1) of the Act, 21 U.S.C. section 360bbb-3(b)(1), unless  the authorization is terminated or revoked sooner.  Performed at [facility], [address] [place], [place] [address]   Blood Culture (routine x 2)     Status: None   Collection Time: 11/05/21  3:21 AM   Specimen: BLOOD  Result Value Ref Range Status   Specimen Description BLOOD RIGHT HAND  Final   Special Requests   Final    BOTTLES DRAWN AEROBIC AND ANAEROBIC Blood Culture results may not be optimal due to an inadequate volume of blood received in culture bottles   Culture   Final    NO GROWTH 5 DAYS Performed at [facility], [address], [place], [place] [address]    Report Status 11/10/2021 FINAL  Final  MRSA Next Gen by PCR, Nasal     Status: None   Collection Time: 11/05/21  6:57 AM   Specimen: Nasal Mucosa; Nasal Swab  Result Value Ref Range Status   MRSA by PCR Next Gen NOT DETECTED NOT DETECTED Final    Comment: (NOTE) The GeneXpert MRSA Assay (FDA approved for NASAL specimens only), is one component of a comprehensive MRSA colonization surveillance program. It is not intended to diagnose MRSA infection nor to guide or monitor treatment for MRSA infections. Test performance is not FDA approved in patients less than 66 years old. Performed at [facility], [address], [place], [place] [address]          Radiology Studies: No  results found.      Scheduled Meds:  amiodarone  200 mg Oral BID   carvedilol  12.5 mg Oral BID WC   Chlorhexidine Gluconate Cloth  6 each Topical Q0600   cinacalcet  60 mg Oral QAC lunch   feeding supplement (NEPRO CARB STEADY)  237 mL Oral TID BM   multivitamin  1 tablet Oral QHS   polyethylene glycol  17 g Oral Daily   senna-docusate  1 tablet Oral BID   sertraline  100 mg Oral Daily   sevelamer carbonate  2,400 mg Oral TID with meals   traZODone  50 mg Oral QHS   Continuous Infusions:  sodium chloride 250 mL (11/12/21 2214)   anticoagulant sodium citrate     heparin 850 Units/hr (11/13/21 1147)     LOS: 8 days      Sidney Ace, MD [facility]   If 7PM-7AM, please contact night-coverage  11/13/2021, 1:27 PM

## 2021-11-13 NOTE — Consult Note (Signed)
ANTICOAGULATION CONSULT NOTE - Initial Consult  Pharmacy Consult for Heparin Infusion Indication: atrial fibrillation  Allergies  Allergen Reactions   Gabapentin Other (See Comments)    Seizure   Adhesive [Tape] Itching    Silk tape is ok to use.    Patient Measurements: Height: '5\' 5"'$  (165.1 cm) Weight: 60.2 kg (132 lb 11.5 oz) IBW/kg (Calculated) : 57 Heparin Dosing Weight: 60.2 kg  Vital Signs: Temp: 98.8 F (37.1 C) (11/07 2129) BP: 135/81 (11/07 2129) Pulse Rate: 88 (11/07 2129)  Labs: Recent Labs    11/11/21 0528 11/12/21 0452 11/13/21 0635 11/13/21 1135 11/13/21 2046  HGB 8.2* 8.8* 8.1*  --   --   HCT 22.9* 25.1* 23.0*  --   --   PLT 147* 186 193  --   --   APTT  --   --   --  40*  --   LABPROT  --   --   --  16.8*  --   INR  --   --   --  1.4*  --   HEPARINUNFRC  --   --   --  <0.10* <0.10*     Estimated Creatinine Clearance: 7.3 mL/min (A) (by C-G formula based on SCr of 6.75 mg/dL (H)).   Medical History: Past Medical History:  Diagnosis Date   Anemia    Anxiety    Breast cancer ([place]) 01/2016   bilateral   CHF (congestive heart failure) (HCC)    Chronic kidney disease    Depression    Dialysis patient [facility])    DVT (deep venous thrombosis) (HCC)    left leg   DVT (deep venous thrombosis) ([place]) 1985   right thigh   Dysrhythmia    Gout    Headache    HTN (hypertension)    Hypertension    Parathyroid abnormality (HCC)    Parathyroid disease ([place])    Pneumonia 12/2015   Psoriasis    Renal insufficiency    Sickle cell trait (HCC)    traits    Medications:  Scheduled:   amiodarone  200 mg Oral BID   carvedilol  12.5 mg Oral BID WC   Chlorhexidine Gluconate Cloth  6 each Topical Q0600   cinacalcet  60 mg Oral QAC lunch   feeding supplement (NEPRO CARB STEADY)  237 mL Oral TID BM   multivitamin  1 tablet Oral QHS   polyethylene glycol  17 g Oral Daily   senna-docusate  1 tablet Oral BID   sertraline  100 mg Oral Daily   sevelamer  carbonate  2,400 mg Oral TID with meals   traZODone  50 mg Oral QHS   Infusions:   sodium chloride 250 mL (11/12/21 2214)   anticoagulant sodium citrate     heparin 850 Units/hr (11/13/21 1147)   PRN: acetaminophen, alteplase, anticoagulant sodium citrate, bisacodyl, diazepam, fluticasone, heparin, HYDROmorphone (DILAUDID) injection, lidocaine (PF), lidocaine-prilocaine, metoprolol tartrate, ondansetron (ZOFRAN) IV, mouth rinse, pentafluoroprop-tetrafluoroeth, sodium chloride  PTA Warfarin regimen: 5 mg MWFSaSu, 7.5 mg TuTh  Assessment: Hamsini Verrilli is a 66 y.o. female presenting with hemorrhagic shock 2/2 R renal hemorrhage, INR 9.2, reversed with Kcentra and VitK, given 2 units FFP and 2 units PRBC. PMH significant for ESRD on HD, AFib & DVT on Warfarin. Patient was on [facility] PTA per chart review, last dose of warfarin on 10/30 in the morning. Per discussion with MD, will cautiously trial heparin for anticoagulation with intent to swap long term anticoagulation from warfarin to DOAC (  pending cost). Pharmacy has been consulted to initiate and manage heparin infusion.   Baseline Labs: aPTT 40, PT 16.8, INR 1.4, Hgb 8.1, Hct 23.0, Plt 193   Goal of Therapy:  Heparin level 0.3-0.7 units/ml (no boluses, high bleed risk) Monitor platelets by anticoagulation protocol: Yes  11/7'@2046'$ : HL<0.10, subtherapeutic   Plan:  Called nurse for patient and confirmed heparin infusion was running properly with no pauses in therapy No bolus per discussion with MD Increase heparin infusion to 1050 units/hr Check HL level in 8 hours  Continue to monitor H&H and platelets daily while on heparin  Pearla Dubonnet, PharmD Clinical Pharmacist 11/13/2021 9:37 PM

## 2021-11-13 NOTE — TOC Progression Note (Signed)
Transition of Care [facility]) - Progression Note    Patient Details  Name: Teresa Cook MRN: [id number] Date of Birth: [birth year]  Transition of Care [facility]) CM/SW [place], LCSW Phone Number: 11/13/2021, 1:25 PM  Clinical Narrative: Ordered RW through [employer].    Expected Discharge Plan: Home/Self Care Barriers to Discharge: Continued Medical Work up  Expected Discharge Plan and Services Expected Discharge Plan: Home/Self Care   Discharge Planning Services: CM Consult   Living arrangements for the past 2 months: Single Family Home                           HH Arranged: Patient Refused [facility]           Social Determinants of Health (SDOH) Interventions    Readmission Risk Interventions    11/06/2021   12:18 PM 07/30/2021    2:09 PM  Readmission Risk Prevention Plan  Transportation Screening Complete Complete  Medication Review [employer]) Complete Complete  PCP or Specialist appointment within 3-5 days of discharge Complete Complete  HRI or Home Care Consult Patient refused Complete  SW Recovery Care/Counseling Consult Not Complete   SW Consult Not Complete Comments NA   Palliative Care Screening Not Applicable Not [place] Not Applicable Not Applicable

## 2021-11-14 DIAGNOSIS — R579 Shock, unspecified: Secondary | ICD-10-CM | POA: Diagnosis not present

## 2021-11-14 LAB — RENAL FUNCTION PANEL
Albumin: 1.9 g/dL — ABNORMAL LOW (ref 3.5–5.0)
Anion gap: 11 (ref 5–15)
BUN: 37 mg/dL — ABNORMAL HIGH (ref 8–23)
CO2: 26 mmol/L (ref 22–32)
Calcium: 9.5 mg/dL (ref 8.9–10.3)
Chloride: 98 mmol/L (ref 98–111)
Creatinine, Ser: 7.13 mg/dL — ABNORMAL HIGH (ref 0.44–1.00)
GFR, Estimated: 6 mL/min — ABNORMAL LOW (ref >60)
Glucose, Bld: 83 mg/dL (ref 70–99)
Phosphorus: 5.3 mg/dL — ABNORMAL HIGH (ref 2.5–4.6)
Potassium: 3.6 mmol/L (ref 3.5–5.1)
Sodium: 135 mmol/L (ref 135–145)

## 2021-11-14 LAB — CBC WITH DIFFERENTIAL/PLATELET
Abs Immature Granulocytes: 0.17 10*3/uL — ABNORMAL HIGH (ref 0.00–0.07)
Basophils Absolute: 0 10*3/uL (ref 0.0–0.1)
Basophils Relative: 0 %
Eosinophils Absolute: 0.2 10*3/uL (ref 0.0–0.5)
Eosinophils Relative: 4 %
HCT: 22.5 % — ABNORMAL LOW (ref 36.0–46.0)
Hemoglobin: 7.9 g/dL — ABNORMAL LOW (ref 12.0–15.0)
Immature Granulocytes: 3 %
Lymphocytes Relative: 17 %
Lymphs Abs: 1.2 10*3/uL (ref 0.7–4.0)
MCH: 27.9 pg (ref 26.0–34.0)
MCHC: 35.1 g/dL (ref 30.0–36.0)
MCV: 79.5 fL — ABNORMAL LOW (ref 80.0–100.0)
Monocytes Absolute: 0.8 10*3/uL (ref 0.1–1.0)
Monocytes Relative: 12 %
Neutro Abs: 4.5 10*3/uL (ref 1.7–7.7)
Neutrophils Relative %: 64 %
Platelets: 213 10*3/uL (ref 150–400)
RBC: 2.83 MIL/uL — ABNORMAL LOW (ref 3.87–5.11)
RDW: 16.6 % — ABNORMAL HIGH (ref 11.5–15.5)
WBC: 6.9 10*3/uL (ref 4.0–10.5)
nRBC: 0 % (ref 0.0–0.2)

## 2021-11-14 LAB — PHOSPHORUS: Phosphorus: 5.2 mg/dL — ABNORMAL HIGH (ref 2.5–4.6)

## 2021-11-14 LAB — MAGNESIUM: Magnesium: 1.9 mg/dL (ref 1.7–2.4)

## 2021-11-14 LAB — HEPARIN LEVEL (UNFRACTIONATED): Heparin Unfractionated: 0.17 IU/mL — ABNORMAL LOW (ref 0.30–0.70)

## 2021-11-14 MED ORDER — HEPARIN BOLUS VIA INFUSION
1800.0000 [IU] | Freq: Once | INTRAVENOUS | Status: DC
  Filled 2021-11-14: qty 1800

## 2021-11-14 MED ORDER — ALPRAZOLAM 0.25 MG PO TABS
0.2500 mg | ORAL_TABLET | Freq: Three times a day (TID) | ORAL | Status: DC | PRN
  Administered 2021-11-15 – 2021-11-22 (×3): 0.25 mg via ORAL
  Filled 2021-11-14 (×3): qty 1

## 2021-11-14 MED ORDER — LOPERAMIDE HCL 2 MG PO CAPS
4.0000 mg | ORAL_CAPSULE | ORAL | Status: DC | PRN
  Administered 2021-11-14 – 2021-11-15 (×2): 4 mg via ORAL
  Filled 2021-11-14 (×2): qty 2

## 2021-11-14 MED ORDER — ZOLPIDEM TARTRATE 5 MG PO TABS
5.0000 mg | ORAL_TABLET | Freq: Every day | ORAL | Status: DC
  Administered 2021-11-14 – 2021-11-21 (×8): 5 mg via ORAL
  Filled 2021-11-14 (×8): qty 1

## 2021-11-14 MED ORDER — APIXABAN 2.5 MG PO TABS
2.5000 mg | ORAL_TABLET | Freq: Two times a day (BID) | ORAL | Status: DC
  Administered 2021-11-14 – 2021-11-21 (×14): 2.5 mg via ORAL
  Filled 2021-11-14 (×14): qty 1

## 2021-11-14 NOTE — Consult Note (Addendum)
ANTICOAGULATION CONSULT NOTE - Initial Consult  Pharmacy Consult for Heparin Infusion Indication: atrial fibrillation  Allergies  Allergen Reactions   Gabapentin Other (See Comments)    Seizure   Adhesive [Tape] Itching    Silk tape is ok to use.    Patient Measurements: Height: '5\' 5"'$  (165.1 cm) Weight: 65.1 kg (143 lb 8.3 oz) IBW/kg (Calculated) : 57 Heparin Dosing Weight: 60.2 kg  Vital Signs: Temp: 98 F (36.7 C) (11/08 0522) Temp Source: Oral (11/08 0522) BP: 139/79 (11/08 0522) Pulse Rate: 88 (11/08 0522)  Labs: Recent Labs    11/12/21 0452 11/13/21 0635 11/13/21 1135 11/13/21 2046 11/14/21 0633  HGB 8.8* 8.1*  --   --  7.9*  HCT 25.1* 23.0*  --   --  22.5*  PLT 186 193  --   --  213  APTT  --   --  40*  --   --   LABPROT  --   --  16.8*  --   --   INR  --   --  1.4*  --   --   HEPARINUNFRC  --   --  <0.10* <0.10* 0.17*     Estimated Creatinine Clearance: 7.3 mL/min (A) (by C-G formula based on SCr of 6.75 mg/dL (H)).   Medical History: Past Medical History:  Diagnosis Date   Anemia    Anxiety    Breast cancer ([place]) 01/2016   bilateral   CHF (congestive heart failure) (HCC)    Chronic kidney disease    Depression    Dialysis patient [facility])    DVT (deep venous thrombosis) (HCC)    left leg   DVT (deep venous thrombosis) ([place]) 1985   right thigh   Dysrhythmia    Gout    Headache    HTN (hypertension)    Hypertension    Parathyroid abnormality (HCC)    Parathyroid disease ([place])    Pneumonia 12/2015   Psoriasis    Renal insufficiency    Sickle cell trait (HCC)    traits    Medications:  Scheduled:   amiodarone  200 mg Oral BID   carvedilol  12.5 mg Oral BID WC   Chlorhexidine Gluconate Cloth  6 each Topical Q0600   cinacalcet  60 mg Oral QAC lunch   feeding supplement (NEPRO CARB STEADY)  237 mL Oral TID BM   heparin  1,800 Units Intravenous Once   multivitamin  1 tablet Oral QHS   polyethylene glycol  17 g Oral Daily    senna-docusate  1 tablet Oral BID   sertraline  100 mg Oral Daily   sevelamer carbonate  2,400 mg Oral TID with meals   traZODone  50 mg Oral QHS   Infusions:   sodium chloride 250 mL (11/12/21 2214)   anticoagulant sodium citrate     heparin 1,050 Units/hr (11/13/21 2154)   PRN: acetaminophen, alteplase, anticoagulant sodium citrate, bisacodyl, diazepam, fluticasone, heparin, HYDROmorphone (DILAUDID) injection, lidocaine (PF), lidocaine-prilocaine, metoprolol tartrate, ondansetron (ZOFRAN) IV, mouth rinse, pentafluoroprop-tetrafluoroeth, sodium chloride  PTA Warfarin regimen: 5 mg MWFSaSu, 7.5 mg TuTh  Assessment: Teresa Cook is a 66 y.o. female presenting with hemorrhagic shock 2/2 R renal hemorrhage, INR 9.2, reversed with Kcentra and VitK, given 2 units FFP and 2 units PRBC. PMH significant for ESRD on HD, AFib & DVT on Warfarin. Patient was on [facility] PTA per chart review, last dose of warfarin on 10/30 in the morning. Per discussion with MD, will cautiously trial heparin for anticoagulation  with intent to swap long term anticoagulation from warfarin to [place] (pending cost). Pharmacy has been consulted to initiate and manage heparin infusion.   Baseline Labs: aPTT 40, PT 16.8, INR 1.4, Hgb 8.1, Hct 23.0, Plt 193   Goal of Therapy:  Heparin level 0.3-0.7 units/ml (no boluses, high bleed risk) Monitor platelets by anticoagulation protocol: Yes  11/7'@2046'$ : HL<0.10, subtherapeutic  11/8'@0633'$ :  HL = 0.17, SUBtherapeutic   Plan:  11/8: HL @ 0633 = 0.17, SUBtherapeutic  Will increase drip rate to 1300 units/hr, no bolus due to increase bleeding risk.   Will recheck HL 8 hrs after rate change.   Check HL level in 8 hours  Continue to monitor H&H and platelets daily while on heparin  Torrian Canion D, PharmD Clinical Pharmacist 11/14/2021 7:16 AM

## 2021-11-14 NOTE — Progress Notes (Signed)
   11/14/21 1308  Vitals  Temp 98.5 F (36.9 C)  Temp Source Oral  BP 134/73  MAP (mmHg) 92  BP Location Right Arm  BP Method Automatic  Patient Position (if appropriate) Lying  Pulse Rate 86  Pulse Rate Source Monitor  ECG Heart Rate 88  Resp (!) 23  Oxygen Therapy  SpO2 96 %  O2 Device Room Air  Patient Activity (if Appropriate) In bed  Pulse Oximetry Type Continuous  During Treatment Monitoring  Blood Flow Rate (mL/min) 200 mL/min  HD Safety Checks Performed Yes  Intra-Hemodialysis Comments Tx completed  Post Treatment  Dialyzer Clearance Lightly streaked  Duration of HD Treatment -hour(s) 2.75 hour(s)  Hemodialysis Intake (mL) 0 mL  Liters Processed 50.4  Fluid Removed (mL) 500 mL  Tolerated HD Treatment Yes  Post-Hemodialysis Comments hd completed. no complications.  Hemodialysis Catheter Left Internal jugular Permanent  Placement Date: (c)   Placed prior to admission: Yes  Orientation: Left  Access Location: Internal jugular  Hemodialysis Catheter Type: Permanent  Site Condition No complications  Blue Lumen Status Heparin locked  Red Lumen Status Heparin locked  Purple Lumen Status N/A  Catheter fill solution Heparin 1000 units/ml  Catheter fill volume (Arterial) 1.7 cc  Catheter fill volume (Venous) 1.7  Dressing Type Transparent  Dressing Status Antimicrobial disc in place  Interventions New dressing  Drainage Description None  Dressing Change Due 11/19/21  Post treatment catheter status Capped and Clamped

## 2021-11-14 NOTE — Progress Notes (Signed)
PROGRESS NOTE    Teresa Cook  NWG:[id number] DOB: 1955/01/20 DOA: 11/05/2021 PCP: [facility] [place]    Brief Narrative:  66 y.o female with PMH significant for ESRD on HD, A. Fib & DVT on Warfarin , admitted with Hemorrhagic shock due to right renal hemorrhage with large hematoma in setting of supratherapeutic INR (INR >9).  Vascular Surgery consulted, required coil embolization of the right renal artery.    66 year old female with a past medical history significant for ESRD on hemodialysis with chronic PermCath, CHF, DVT, cerebral meningioma who presents to [facility] ED on 11/05/2021 status post fall and loss of consciousness.  Patient reports she lives alone and passed out at least 4 times overnight, and when going to the bathroom around 5 AM this morning,  she passed out again it and thinks she hit her head.  Following the fall she also reports development of back/flank pain that radiates around to the right lower quadrant.   She reports she has been compliant with her hemodialysis, last session on Friday.  She denies chest pain, shortness of breath.  She reports compliance with her warfarin for anticoagulation.  10/30: Presented to ED, found to have hemorrhagic shock due to right renal hemorrhage.  Coumadin reversed with Kcentra and Vitamin K.  2 units of FFP and 2 unit of pRBCs administered.  Vascular Surgery performed embolization of right renal artery 10/31: Weaned off pressors, lactic normal. New thrombocytopenia, DIC workup negative.  Continues to complain of significant right sided abdominal/flank pain, will repeat CT Abdomen & Pelvis.  Transfuse 1 unit pRBCs for Hgb 6.5 10/31: [place] Bilateral Carotid revealed right carotid artery        system: Less than 50% stenosis secondary to mild multifocal        atherosclerotic plaque formation. Left carotid artery system:        Less than 50% stenosis secondary to mild multifocal        atherosclerotic plaque formation.  Vertebral artery system:        Patent with antegrade flow bilaterally 11/1: Pt remains on amiodarone gtt and precedex gtt.  States pain is better controlled.  In sinus rhythm rate controlled.  During HD session pt developed atrial fibrillation with rvr and hypotension requiring neo-synephrine gtt.  Required 1 unit pRBC due to hgb 6.9 post transfusion hgb 8.0 no signs of bleeding  11/2: Pt remains on amiodarone gtt, neo-synephrine gtt '@20'$  mcg/min, and precedex gtt '@0'$ .7 mcg/kg/hr.   11/3: DNR/DNI status, off pressors 11/4: Care assumed by [facility] hospitalist service.  Neo-Synephrine and Precedex weaned off.  Feels better this morning. 11/5: No acute events overnight.  Patient feels well this morning. 11/7: Will challenge with IV heparin today 11/8: Hemoglobin 7.9.  No bleeding noted.  Will transition to p.o. Eliquis today.   Assessment & Plan:   Principal Problem:   Shock ([place]) Active Problems:   Acute respiratory failure with hypoxia ([place])   ESRD on dialysis ([place])   End-stage renal disease on hemodialysis (HCC)   HTN (hypertension)   PAF (paroxysmal atrial fibrillation) (HCC)   Hypotension   Anemia due to acute blood loss   Right flank pain   Retroperitoneal bleed   Altered mental status   Elevated lactic acid level   Atrial fibrillation with rapid ventricular response (H[facility])  Hemorrhagic shock in the setting of right renal hemorrhage Supratherapeutic INR status post Coumadin reversal agent Acute blood loss anemia Patient required coil embolization of right renal artery by vascular surgery.  Hemoglobin continues to drift but overall stable.  INR 1.5.  Shock resolved.  Neo-Synephrine discontinued. Hemoglobin appears to be stablizing Slowly drifting down but no bleeding noted.  Vitals remain within normal limits Challenged with IV heparin on 11/7 Plan: Transition to p.o. Eliquis today Low-dose 2.5 mg twice daily Monitor closely for bleeding  Ileus Patient had worsening of abdominal  distention noted on 11/3.  KUB revealed gaseous distention consistent with ileus.  No signs of obstruction or free air under the diaphragm.  Patient has a relatively soft abdomen and good bowel sounds.  Multiple bowel movements reported.  Bowel regimen made as needed.  Continue therapy as tolerated.  Home with home health once clinically stabilized.  Atrial tachycardia/PNSVT/atrial fibrillation with rapid ventricular response Rate control improved Plan: Continue PO amio Start p.o. Eliquis as above  ESRD on HD Nephrology following for inpatient hemodialysis needs  Heart failure with preserved ejection fraction LVEF 50 to 82%, grade 1 diastolic dysfunction Nephrology for fluid removal Daily weights, strict I's and O's Defer reintroduction of goal-directed medical therapy at this time.  If hemoglobin remained stable and vitals reassuring consider restarting home GDMT on 11/9.    DVT prophylaxis: P.o. Eliquis Code Status: DNR Family Communication: Daughter via phone 11/5, 11/7 Disposition Plan: Status is: Inpatient Remains inpatient appropriate because: Multiple acute issues requiring continued hospitalization   Level of care: Progressive  Consultants:  Nephrology  Procedures:  Coil embolization  Antimicrobials:  Subjective: Seen and examined.  Reports that her anxiety is not adequately being treated.  Objective: Vitals:   11/14/21 0522 11/14/21 0751 11/14/21 0955 11/14/21 1008  BP: 139/79 128/83 135/73 (!) 146/88  Pulse: 88 84 66 92  Resp: 18 17 (!) 23 20  Temp: 98 F (36.7 C) 98.1 F (36.7 C) 98.3 F (36.8 C) 98.3 F (36.8 C)  TempSrc: Oral  Oral Oral  SpO2: 94% 94% 94% 98%  Weight:   69.6 kg   Height:        Intake/Output Summary (Last 24 hours) at 11/14/2021 1102 Last data filed at 11/13/2021 2200 Gross per 24 hour  Intake 651 ml  Output --  Net 651 ml   Filed Weights   11/12/21 1811 11/14/21 0500 11/14/21 0955  Weight: 60.2 kg 65.1 kg 69.6 kg     Examination: ` General exam: NAD.  Appears frail Respiratory system: Bibasilar crackles.  Normal work of breathing.  2 L Cardiovascular system: S1-S2, RRR, no murmurs, no pedal edema Gastrointestinal system: Soft, nondistended, mild TTP, normal bowel sounds. Central nervous system: Alert and oriented. No focal neurological deficits. Extremities: Symmetrically decreased power bilaterally Skin: No rashes, lesions or ulcers Psychiatry: Judgement and insight appear normal. Mood & affect flattened.     Data Reviewed: I have personally reviewed following labs and imaging studies  CBC: Recent Labs  Lab 11/10/21 0440 11/11/21 0528 11/12/21 0452 11/13/21 0635 11/14/21 0633  WBC 8.8 6.6 7.2 7.0 6.9  NEUTROABS 6.5 4.6 4.9 4.8 4.5  HGB 8.5* 8.2* 8.8* 8.1* 7.9*  HCT 24.4* 22.9* 25.1* 23.0* 22.5*  MCV 80.5 78.7* 79.4* 78.0* 79.5*  PLT 144* 147* 186 193 956   Basic Metabolic Panel: Recent Labs  Lab 11/08/21 0216 11/09/21 0515 11/10/21 0440 11/11/21 0528 11/12/21 0452 11/13/21 0635 11/14/21 0633  NA 133* 135 131*  --   --   --  135  K 4.0 4.0 3.7 3.4* 3.9  --  3.6  CL 96* 93* 91*  --   --   --  98  CO2 '28 29 27  '$ --   --   --  26  GLUCOSE 116* 106* 106*  --   --   --  83  BUN 34* 25* 40*  --   --   --  37*  CREATININE 5.91* 4.88* 6.75*  --   --   --  7.13*  CALCIUM 9.3 9.5 10.0  --   --   --  9.5  MG 1.7 2.2 2.2 1.9 2.0 1.9 1.9  PHOS 5.0* 5.4* 6.3* 4.3 4.8* 4.2 5.2*  5.3*   GFR: Estimated Creatinine Clearance: 7.5 mL/min (A) (by C-G formula based on SCr of 7.13 mg/dL (H)). Liver Function Tests: Recent Labs  Lab 11/14/21 0633  ALBUMIN 1.9*   No results for input(s): "LIPASE", "AMYLASE" in the last 168 hours. No results for input(s): "AMMONIA" in the last 168 hours. Coagulation Profile: Recent Labs  Lab 11/08/21 1838 11/10/21 1625 11/13/21 1135  INR 1.5* 1.3* 1.4*   Cardiac Enzymes: No results for input(s): "CKTOTAL", "CKMB", "CKMBINDEX", "TROPONINI" in the  last 168 hours. BNP (last 3 results) No results for input(s): "PROBNP" in the last 8760 hours. HbA1C: No results for input(s): "HGBA1C" in the last 72 hours. CBG: Recent Labs  Lab 11/12/21 1958 11/13/21 0512 11/13/21 0811 11/13/21 1201 11/13/21 1646  GLUCAP 100* 100* 84 107* 104*   Lipid Profile: No results for input(s): "CHOL", "HDL", "LDLCALC", "TRIG", "CHOLHDL", "LDLDIRECT" in the last 72 hours. Thyroid Function Tests: No results for input(s): "TSH", "T4TOTAL", "FREET4", "T3FREE", "THYROIDAB" in the last 72 hours. Anemia Panel: No results for input(s): "VITAMINB12", "FOLATE", "FERRITIN", "TIBC", "IRON", "RETICCTPCT" in the last 72 hours. Sepsis Labs: Recent Labs  Lab 11/10/21 0817  PROCALCITON 3.96    Recent Results (from the past 240 hour(s))  Blood Culture (routine x 2)     Status: None   Collection Time: 11/05/21  3:20 AM   Specimen: BLOOD  Result Value Ref Range Status   Specimen Description BLOOD RIGHT FA  Final   Special Requests   Final    BOTTLES DRAWN AEROBIC AND ANAEROBIC Blood Culture results may not be optimal due to an inadequate volume of blood received in culture bottles   Culture   Final    NO GROWTH 5 DAYS Performed at [facility], [address] [place], [place] [address]    Report Status 11/10/2021 FINAL  Final  SARS Coronavirus 2 by RT PCR (hospital order, performed in [facility] hospital lab) *cepheid single result test* Anterior Nasal Swab     Status: None   Collection Time: 11/05/21  3:20 AM   Specimen: Anterior Nasal Swab  Result Value Ref Range Status   SARS Coronavirus 2 by RT PCR NEGATIVE NEGATIVE Final    Comment: (NOTE) SARS-CoV-2 target nucleic acids are NOT DETECTED.  The SARS-CoV-2 RNA is generally detectable in upper and lower respiratory specimens during the acute phase of infection. The lowest concentration of SARS-CoV-2 viral copies this assay can detect is 250 copies / mL. A negative result does not preclude  SARS-CoV-2 infection and should not be used as the sole basis for treatment or other patient management decisions.  A negative result may occur with improper specimen collection / handling, submission of specimen other than nasopharyngeal swab, presence of viral mutation(s) within the areas targeted by this assay, and inadequate number of viral copies (<250 copies / mL). A negative result must be combined with clinical observations, patient history, and epidemiological information.  Fact Sheet  for Patients:   https://www.patel.info/  Fact Sheet for Healthcare Providers: https://hall.com/  This test is not yet approved or  cleared by the [place] FDA and has been authorized for detection and/or diagnosis of SARS-CoV-2 by FDA under an Emergency Use Authorization (EUA).  This EUA will remain in effect (meaning this test can be used) for the duration of the COVID-19 declaration under Section 564(b)(1) of the Act, 21 U.S.C. section 360bbb-3(b)(1), unless the authorization is terminated or revoked sooner.  Performed at [facility], [address] [place], [place] [address]   Blood Culture (routine x 2)     Status: None   Collection Time: 11/05/21  3:21 AM   Specimen: BLOOD  Result Value Ref Range Status   Specimen Description BLOOD RIGHT HAND  Final   Special Requests   Final    BOTTLES DRAWN AEROBIC AND ANAEROBIC Blood Culture results may not be optimal due to an inadequate volume of blood received in culture bottles   Culture   Final    NO GROWTH 5 DAYS Performed at [facility], [address] [place], [place] [address]    Report Status 11/10/2021 FINAL  Final  MRSA Next Gen by PCR, Nasal     Status: None   Collection Time: 11/05/21  6:57 AM   Specimen: Nasal Mucosa; Nasal Swab  Result Value Ref Range Status   MRSA by PCR Next Gen NOT DETECTED NOT DETECTED Final    Comment: (NOTE) The GeneXpert MRSA Assay  (FDA approved for NASAL specimens only), is one component of a comprehensive MRSA colonization surveillance program. It is not intended to diagnose MRSA infection nor to guide or monitor treatment for MRSA infections. Test performance is not FDA approved in patients less than 73 years old. Performed at [facility], [address] [place], [place] [address]          Radiology Studies: No results found.      Scheduled Meds:  amiodarone  200 mg Oral BID   apixaban  2.5 mg Oral BID   carvedilol  12.5 mg Oral BID WC   Chlorhexidine Gluconate Cloth  6 each Topical Q0600   cinacalcet  60 mg Oral QAC lunch   feeding supplement (NEPRO CARB STEADY)  237 mL Oral TID BM   multivitamin  1 tablet Oral QHS   polyethylene glycol  17 g Oral Daily   senna-docusate  1 tablet Oral BID   sertraline  100 mg Oral Daily   sevelamer carbonate  2,400 mg Oral TID with meals   traZODone  50 mg Oral QHS   zolpidem  5 mg Oral QHS   Continuous Infusions:  sodium chloride 250 mL (11/12/21 2214)   anticoagulant sodium citrate       LOS: 9 days      Sidney Ace, MD [facility]   If 7PM-7AM, please contact night-coverage  11/14/2021, 11:02 AM

## 2021-11-14 NOTE — Progress Notes (Signed)
PT Cancellation Note  Patient Details Name: Teresa Cook MRN: [id number] DOB: May 16, 1955   Cancelled Treatment:    Reason Eval/Treat Not Completed: Other (comment). Pt out of room at this time, PT to re-attempt as able.    Lieutenant Diego PT, DPT 12:00 PM,11/14/21

## 2021-11-14 NOTE — Progress Notes (Signed)
[facility]  ROUNDING NOTE   Subjective:   Teresa Cook is a 66 year old African-American female with past medical conditions including hypertension, gout, anxiety with depression, sickle cell trait, systolic heart failure with a EF 40 to 45%, breast cancer with bilateral mastectomy, seizures, and end-stage renal disease on hemodialysis.  Patient presents to the emergency department via EMS for multiple syncopal episodes.  Patient has been admitted for Right flank pain [R10.9] Elevated lactic acid level [R79.89] Hypotension [I95.9] Hypotension, unspecified hypotension type [I95.9] Altered mental status, unspecified altered mental status type [R41.82]  Patient is known to our practice and receives outpatient dialysis treatments at [facility] on a MWF schedule, supervised by Dr. Candiss Norse.    Patient seen laying in bed, waiting for breakfast tray Alert and oriented Scheduled to receive dialysis today  Objective:  Vital signs in last 24 hours:  Temp:  [98 F (36.7 C)-98.8 F (37.1 C)] 98.3 F (36.8 C) (11/08 1008) Pulse Rate:  [66-92] 84 (11/08 1100) Resp:  [16-26] 19 (11/08 1100) BP: (102-146)/(68-88) 120/71 (11/08 1100) SpO2:  [94 %-99 %] 95 % (11/08 1100) Weight:  [65.1 kg-69.6 kg] 69.6 kg (11/08 0955)  Weight change: 3.638 kg Filed Weights   11/12/21 1811 11/14/21 0500 11/14/21 0955  Weight: 60.2 kg 65.1 kg 69.6 kg    Intake/Output: I/O last 3 completed shifts: In: 324 [P.O.:840; I.V.:51] Out: -    Intake/Output this shift:  No intake/output data recorded.  Physical Exam: General: NAD  Head: Normocephalic, atraumatic. Moist oral mucosal membranes  Eyes: Anicteric  Lungs:  Clear to auscultation, normal effort, room air  Heart: Regular rate and rhythm  Abdomen:  Soft, nontender  Extremities:  No peripheral edema.  Neurologic: Nonfocal, moving all four extremities  Skin: No lesions  Access: Lt Permcath    Basic Metabolic Panel: Recent  Labs  Lab 11/08/21 0216 11/09/21 0515 11/10/21 0440 11/11/21 0528 11/12/21 0452 11/13/21 0635 11/14/21 0633  NA 133* 135 131*  --   --   --  135  K 4.0 4.0 3.7 3.4* 3.9  --  3.6  CL 96* 93* 91*  --   --   --  98  CO2 '28 29 27  '$ --   --   --  26  GLUCOSE 116* 106* 106*  --   --   --  83  BUN 34* 25* 40*  --   --   --  37*  CREATININE 5.91* 4.88* 6.75*  --   --   --  7.13*  CALCIUM 9.3 9.5 10.0  --   --   --  9.5  MG 1.7 2.2 2.2 1.9 2.0 1.9 1.9  PHOS 5.0* 5.4* 6.3* 4.3 4.8* 4.2 5.2*  5.3*     Liver Function Tests: Recent Labs  Lab 11/14/21 4010  ALBUMIN 1.9*    No results for input(s): "LIPASE", "AMYLASE" in the last 168 hours. No results for input(s): "AMMONIA" in the last 168 hours.  CBC: Recent Labs  Lab 11/10/21 0440 11/11/21 0528 11/12/21 0452 11/13/21 0635 11/14/21 0633  WBC 8.8 6.6 7.2 7.0 6.9  NEUTROABS 6.5 4.6 4.9 4.8 4.5  HGB 8.5* 8.2* 8.8* 8.1* 7.9*  HCT 24.4* 22.9* 25.1* 23.0* 22.5*  MCV 80.5 78.7* 79.4* 78.0* 79.5*  PLT 144* 147* 186 193 213     Cardiac Enzymes: No results for input(s): "CKTOTAL", "CKMB", "CKMBINDEX", "TROPONINI" in the last 168 hours.  BNP: Invalid input(s): "POCBNP"  CBG: Recent Labs  Lab 11/12/21 1958 11/13/21  9735 11/13/21 0811 11/13/21 1201 11/13/21 1646  GLUCAP 100* 100* 84 107* 104*     Microbiology: Results for orders placed or performed during the hospital encounter of 11/05/21  Blood Culture (routine x 2)     Status: None   Collection Time: 11/05/21  3:20 AM   Specimen: BLOOD  Result Value Ref Range Status   Specimen Description BLOOD RIGHT FA  Final   Special Requests   Final    BOTTLES DRAWN AEROBIC AND ANAEROBIC Blood Culture results may not be optimal due to an inadequate volume of blood received in culture bottles   Culture   Final    NO GROWTH 5 DAYS Performed at [facility], [address] [place], [place] [address]    Report Status 11/10/2021 FINAL  Final  SARS Coronavirus 2 by  RT PCR (hospital order, performed in [facility] hospital lab) *cepheid single result test* Anterior Nasal Swab     Status: None   Collection Time: 11/05/21  3:20 AM   Specimen: Anterior Nasal Swab  Result Value Ref Range Status   SARS Coronavirus 2 by RT PCR NEGATIVE NEGATIVE Final    Comment: (NOTE) SARS-CoV-2 target nucleic acids are NOT DETECTED.  The SARS-CoV-2 RNA is generally detectable in upper and lower respiratory specimens during the acute phase of infection. The lowest concentration of SARS-CoV-2 viral copies this assay can detect is 250 copies / mL. A negative result does not preclude SARS-CoV-2 infection and should not be used as the sole basis for treatment or other patient management decisions.  A negative result may occur with improper specimen collection / handling, submission of specimen other than nasopharyngeal swab, presence of viral mutation(s) within the areas targeted by this assay, and inadequate number of viral copies (<250 copies / mL). A negative result must be combined with clinical observations, patient history, and epidemiological information.  Fact Sheet for Patients:   https://www.patel.info/  Fact Sheet for Healthcare Providers: https://hall.com/  This test is not yet approved or  cleared by the [place] FDA and has been authorized for detection and/or diagnosis of SARS-CoV-2 by FDA under an Emergency Use Authorization (EUA).  This EUA will remain in effect (meaning this test can be used) for the duration of the COVID-19 declaration under Section 564(b)(1) of the Act, 21 U.S.C. section 360bbb-3(b)(1), unless the authorization is terminated or revoked sooner.  Performed at [facility], [address] [place], [place] [address]   Blood Culture (routine x 2)     Status: None   Collection Time: 11/05/21  3:21 AM   Specimen: BLOOD  Result Value Ref Range Status   Specimen Description  BLOOD RIGHT HAND  Final   Special Requests   Final    BOTTLES DRAWN AEROBIC AND ANAEROBIC Blood Culture results may not be optimal due to an inadequate volume of blood received in culture bottles   Culture   Final    NO GROWTH 5 DAYS Performed at [facility], [address], [place], [place] [address]    Report Status 11/10/2021 FINAL  Final  MRSA Next Gen by PCR, Nasal     Status: None   Collection Time: 11/05/21  6:57 AM   Specimen: Nasal Mucosa; Nasal Swab  Result Value Ref Range Status   MRSA by PCR Next Gen NOT DETECTED NOT DETECTED Final    Comment: (NOTE) The GeneXpert MRSA Assay (FDA approved for NASAL specimens only), is one component of a comprehensive MRSA colonization surveillance program.  It is not intended to diagnose MRSA infection nor to guide or monitor treatment for MRSA infections. Test performance is not FDA approved in patients less than 67 years old. Performed at [facility], [address] [place], [place] [address]     Coagulation Studies: Recent Labs    11/13/21 1135  LABPROT 16.8*  INR 1.4*     Urinalysis: No results for input(s): "COLORURINE", "LABSPEC", "PHURINE", "GLUCOSEU", "HGBUR", "BILIRUBINUR", "KETONESUR", "PROTEINUR", "UROBILINOGEN", "NITRITE", "LEUKOCYTESUR" in the last 72 hours.  Invalid input(s): "APPERANCEUR"    Imaging: No results found.   Medications:    sodium chloride 250 mL (11/12/21 2214)   anticoagulant sodium citrate      amiodarone  200 mg Oral BID   apixaban  2.5 mg Oral BID   carvedilol  12.5 mg Oral BID WC   Chlorhexidine Gluconate Cloth  6 each Topical Q0600   cinacalcet  60 mg Oral QAC lunch   feeding supplement (NEPRO CARB STEADY)  237 mL Oral TID BM   multivitamin  1 tablet Oral QHS   polyethylene glycol  17 g Oral Daily   senna-docusate  1 tablet Oral BID   sertraline  100 mg Oral Daily   sevelamer carbonate  2,400 mg Oral TID with meals   traZODone  50 mg Oral QHS   zolpidem  5  mg Oral QHS   acetaminophen, ALPRAZolam, alteplase, anticoagulant sodium citrate, bisacodyl, fluticasone, heparin, HYDROmorphone (DILAUDID) injection, lidocaine (PF), lidocaine-prilocaine, metoprolol tartrate, ondansetron (ZOFRAN) IV, mouth rinse, pentafluoroprop-tetrafluoroeth, sodium chloride  Assessment/ Plan:  Ms. Renatta Shrieves is a 66 y.o.  female is a 66 year old African-American female with past medical conditions including hypertension, gout, anxiety with depression, sickle cell trait, systolic heart failure with a EF 40 to 45%, breast cancer with bilateral mastectomy, seizures, and end-stage renal disease on hemodialysis.  Patient presents to the emergency department via EMS for multiple syncopal episodes.  Patient has been admitted for Right flank pain [R10.9] Elevated lactic acid level [R79.89] Hypotension [I95.9] Hypotension, unspecified hypotension type [I95.9] Altered mental status, unspecified altered mental status type [R41.82]  [facility]/MWF/Lt Permcath  End-stage renal disease on hemodialysis.  Will maintain outpatient schedule if possible.  Dialysis scheduled for this morning, UF goal 0.5L as tolerated.   2. Anemia of chronic kidney disease with acute blood loss Lab Results  Component Value Date   HGB 7.9 (L) 11/14/2021  CT angio abdomen pelvis shows severe hemorrhage at right kidney.  Appreciate vascular surgery performing right renal angiogram with coil embolization to manage hemorrhage.  Patient received 2 units FFP and blood transfusions thus far during admission.  Hgb remains below target, but improving. Successful heparin trial, will be transitioned to Eliquis.    3. Secondary Hyperparathyroidism: with outpatient labs: PTH 379, phosphorus 5.7, calcium 9.4 on June 18, 2021.   Lab Results  Component Value Date   PTH 214 (H) 07/08/2021   CALCIUM 9.5 11/14/2021   PHOS 5.2 (H) 11/14/2021   PHOS 5.3 (H) 11/14/2021    Continue sevelamer with all  meals.  4.  Hypotension likely due to blood loss, Currently weaned off pressors.  Amiodarone twice daily  5.  Hyperkalemia, potassium corrected    LOS: 9 Charan Prieto 11/8/202311:24 AM

## 2021-11-15 DIAGNOSIS — R579 Shock, unspecified: Secondary | ICD-10-CM | POA: Diagnosis not present

## 2021-11-15 LAB — CBC WITH DIFFERENTIAL/PLATELET
Abs Immature Granulocytes: 0.19 10*3/uL — ABNORMAL HIGH (ref 0.00–0.07)
Basophils Absolute: 0 10*3/uL (ref 0.0–0.1)
Basophils Relative: 0 %
Eosinophils Absolute: 0.2 10*3/uL (ref 0.0–0.5)
Eosinophils Relative: 3 %
HCT: 21.6 % — ABNORMAL LOW (ref 36.0–46.0)
Hemoglobin: 7.7 g/dL — ABNORMAL LOW (ref 12.0–15.0)
Immature Granulocytes: 3 %
Lymphocytes Relative: 14 %
Lymphs Abs: 0.9 10*3/uL (ref 0.7–4.0)
MCH: 28.3 pg (ref 26.0–34.0)
MCHC: 35.6 g/dL (ref 30.0–36.0)
MCV: 79.4 fL — ABNORMAL LOW (ref 80.0–100.0)
Monocytes Absolute: 0.8 10*3/uL (ref 0.1–1.0)
Monocytes Relative: 12 %
Neutro Abs: 4.6 10*3/uL (ref 1.7–7.7)
Neutrophils Relative %: 68 %
Platelets: 230 10*3/uL (ref 150–400)
RBC: 2.72 MIL/uL — ABNORMAL LOW (ref 3.87–5.11)
RDW: 16.8 % — ABNORMAL HIGH (ref 11.5–15.5)
WBC: 6.8 10*3/uL (ref 4.0–10.5)
nRBC: 0 % (ref 0.0–0.2)

## 2021-11-15 LAB — MAGNESIUM: Magnesium: 1.7 mg/dL (ref 1.7–2.4)

## 2021-11-15 LAB — PHOSPHORUS: Phosphorus: 3.8 mg/dL (ref 2.5–4.6)

## 2021-11-15 MED ORDER — SACUBITRIL-VALSARTAN 24-26 MG PO TABS
1.0000 | ORAL_TABLET | Freq: Two times a day (BID) | ORAL | Status: DC
  Administered 2021-11-15 – 2021-11-22 (×13): 1 via ORAL
  Filled 2021-11-15 (×14): qty 1

## 2021-11-15 MED ORDER — METHOCARBAMOL 500 MG PO TABS
500.0000 mg | ORAL_TABLET | Freq: Four times a day (QID) | ORAL | Status: DC | PRN
  Administered 2021-11-18 – 2021-11-20 (×2): 500 mg via ORAL
  Filled 2021-11-15 (×2): qty 1

## 2021-11-15 MED ORDER — LIDOCAINE 5 % EX PTCH
1.0000 | MEDICATED_PATCH | CUTANEOUS | Status: DC
  Administered 2021-11-15 – 2021-11-17 (×3): 1 via TRANSDERMAL
  Filled 2021-11-15 (×3): qty 1

## 2021-11-15 MED ORDER — ALUM & MAG HYDROXIDE-SIMETH 200-200-20 MG/5ML PO SUSP
30.0000 mL | ORAL | Status: DC | PRN
  Administered 2021-11-15 – 2021-11-19 (×2): 30 mL via ORAL
  Filled 2021-11-15 (×2): qty 30

## 2021-11-15 MED ORDER — FAMOTIDINE 20 MG PO TABS
20.0000 mg | ORAL_TABLET | Freq: Every day | ORAL | Status: DC
  Administered 2021-11-15 – 2021-11-22 (×8): 20 mg via ORAL
  Filled 2021-11-15 (×8): qty 1

## 2021-11-15 NOTE — Progress Notes (Signed)
PROGRESS NOTE    Teresa Cook  EZM:[id number] DOB: 24-Apr-1955 DOA: 11/05/2021 PCP: [facility] [place]    Brief Narrative:  66 y.o female with PMH significant for ESRD on HD, A. Fib & DVT on Warfarin , admitted with Hemorrhagic shock due to right renal hemorrhage with large hematoma in setting of supratherapeutic INR (INR >9).  Vascular Surgery consulted, required coil embolization of the right renal artery.    66 year old female with a past medical history significant for ESRD on hemodialysis with chronic PermCath, CHF, DVT, cerebral meningioma who presents to [facility] ED on 11/05/2021 status post fall and loss of consciousness.  Patient reports she lives alone and passed out at least 4 times overnight, and when going to the bathroom around 5 AM this morning,  she passed out again it and thinks she hit her head.  Following the fall she also reports development of back/flank pain that radiates around to the right lower quadrant.   She reports she has been compliant with her hemodialysis, last session on Friday.  She denies chest pain, shortness of breath.  She reports compliance with her warfarin for anticoagulation.  10/30: Presented to ED, found to have hemorrhagic shock due to right renal hemorrhage.  Coumadin reversed with Kcentra and Vitamin K.  2 units of FFP and 2 unit of pRBCs administered.  Vascular Surgery performed embolization of right renal artery 10/31: Weaned off pressors, lactic normal. New thrombocytopenia, DIC workup negative.  Continues to complain of significant right sided abdominal/flank pain, will repeat CT Abdomen & Pelvis.  Transfuse 1 unit pRBCs for Hgb 6.5 10/31: [place] Bilateral Carotid revealed right carotid artery        system: Less than 50% stenosis secondary to mild multifocal        atherosclerotic plaque formation. Left carotid artery system:        Less than 50% stenosis secondary to mild multifocal        atherosclerotic plaque formation.  Vertebral artery system:        Patent with antegrade flow bilaterally 11/1: Pt remains on amiodarone gtt and precedex gtt.  States pain is better controlled.  In sinus rhythm rate controlled.  During HD session pt developed atrial fibrillation with rvr and hypotension requiring neo-synephrine gtt.  Required 1 unit pRBC due to hgb 6.9 post transfusion hgb 8.0 no signs of bleeding  11/2: Pt remains on amiodarone gtt, neo-synephrine gtt '@20'$  mcg/min, and precedex gtt '@0'$ .7 mcg/kg/hr.   11/3: DNR/DNI status, off pressors 11/4: Care assumed by [facility] hospitalist service.  Neo-Synephrine and Precedex weaned off.  Feels better this morning. 11/5: No acute events overnight.  Patient feels well this morning. 11/7: Will challenge with IV heparin today 11/8: Hemoglobin 7.9.  No bleeding noted.  Will transition to p.o. Eliquis today. 11/9: Hemoglobin 7.7.  No bleeding noted.  Continues on Eliquis.   Assessment & Plan:   Principal Problem:   Shock ([place]) Active Problems:   Acute respiratory failure with hypoxia ([employer])   ESRD on dialysis ([place])   End-stage renal disease on hemodialysis (HCC)   HTN (hypertension)   PAF (paroxysmal atrial fibrillation) (HCC)   Hypotension   Anemia due to acute blood loss   Right flank pain   Retroperitoneal bleed   Altered mental status   Elevated lactic acid level   Atrial fibrillation with rapid ventricular response (HCC)  Hemorrhagic shock in the setting of right renal hemorrhage Supratherapeutic INR status post Coumadin reversal agent Acute blood loss anemia  Patient required coil embolization of right renal artery by vascular surgery.  Hemoglobin continues to drift but overall stable.  INR 1.5.  Shock resolved.  Neo-Synephrine discontinued. Hemoglobin appears to be stablizing Slowly drifting down but no bleeding noted.  Vitals remain within normal limits Challenged with IV heparin on 11/7 Hemoglobin 7.7 on 11/9 Plan: Continue low-dose Eliquis 2.5 mg twice  daily Monitor closely for bleeding Daily CBC  Ileus, resolved Patient had worsening of abdominal distention noted on 11/3.  KUB revealed gaseous distention consistent with ileus.  No signs of obstruction or free air under the diaphragm.  Patient has a relatively soft abdomen and good bowel sounds.  Multiple bowel movements reported.  Bowel regimen made as needed.  Continue therapy as tolerated.  Home with home health once clinically stabilized.  Atrial tachycardia/PNSVT/atrial fibrillation with rapid ventricular response Rate control improved Plan: Continue PO amio Continue p.o. Eliquis as above  ESRD on HD Nephrology following for inpatient hemodialysis needs  Heart failure with preserved ejection fraction LVEF 50 to 38%, grade 1 diastolic dysfunction Nephrology for fluid removal Daily weights, strict I's and O's Continue Coreg 12.5 mg daily Entresto restarted 11/9 Hold Imdur for now, can consider restarting within the next 24 hours if blood pressure tolerates    DVT prophylaxis: P.o. Eliquis Code Status: DNR Family Communication: Daughter via phone 11/5, 11/7, 11/9 Disposition Plan: Status is: Inpatient Remains inpatient appropriate because: Monitor for bleeding.  Once hemoglobin stable consider discharge home.   Level of care: Progressive  Consultants:  Nephrology  Procedures:  Coil embolization  Antimicrobials:  Subjective: Seen and examined.  More awake and alert today.  Endorses right-sided lower back pain with radiation down right leg  Objective: Vitals:   11/15/21 0028 11/15/21 0424 11/15/21 0834 11/15/21 0958  BP: 112/63 115/64  (!) 158/90  Pulse: 84 75 79 88  Resp: 18 (!) '23 18 20  '$ Temp: 99.4 F (37.4 C) 98.5 F (36.9 C) 98.7 F (37.1 C) 98.9 F (37.2 C)  TempSrc: Oral Oral  Oral  SpO2: 98% 97% 95% 92%  Weight:      Height:        Intake/Output Summary (Last 24 hours) at 11/15/2021 1145 Last data filed at 11/15/2021 1000 Gross per 24 hour   Intake 480 ml  Output 503 ml  Net -23 ml   Filed Weights   11/14/21 0500 11/14/21 0955 11/14/21 1312  Weight: 65.1 kg 69.6 kg 69.1 kg    Examination: ` General exam: No acute distress.  Appears comfortable Respiratory system: Lungs clear.  Normal work of breathing.  Room air Cardiovascular system: S1-S2, RRR, no murmurs, no pedal edema Gastrointestinal system: Soft, nondistended, mild TTP, normal bowel sounds. Central nervous system: Alert and oriented. No focal neurological deficits. Extremities: Symmetrically decreased power bilaterally Skin: No rashes, lesions or ulcers Psychiatry: Judgement and insight appear normal. Mood & affect flattened.     Data Reviewed: I have personally reviewed following labs and imaging studies  CBC: Recent Labs  Lab 11/11/21 0528 11/12/21 0452 11/13/21 0635 11/14/21 0633 11/15/21 0600  WBC 6.6 7.2 7.0 6.9 6.8  NEUTROABS 4.6 4.9 4.8 4.5 4.6  HGB 8.2* 8.8* 8.1* 7.9* 7.7*  HCT 22.9* 25.1* 23.0* 22.5* 21.6*  MCV 78.7* 79.4* 78.0* 79.5* 79.4*  PLT 147* 186 193 213 250   Basic Metabolic Panel: Recent Labs  Lab 11/09/21 0515 11/10/21 0440 11/11/21 0528 11/12/21 0452 11/13/21 0635 11/14/21 0633 11/15/21 0600  NA 135 131*  --   --   --  135  --   K 4.0 3.7 3.4* 3.9  --  3.6  --   CL 93* 91*  --   --   --  98  --   CO2 29 27  --   --   --  26  --   GLUCOSE 106* 106*  --   --   --  83  --   BUN 25* 40*  --   --   --  37*  --   CREATININE 4.88* 6.75*  --   --   --  7.13*  --   CALCIUM 9.5 10.0  --   --   --  9.5  --   MG 2.2 2.2 1.9 2.0 1.9 1.9 1.7  PHOS 5.4* 6.3* 4.3 4.8* 4.2 5.2*  5.3* 3.8   GFR: Estimated Creatinine Clearance: 7.5 mL/min (A) (by C-G formula based on SCr of 7.13 mg/dL (H)). Liver Function Tests: Recent Labs  Lab 11/14/21 0633  ALBUMIN 1.9*   No results for input(s): "LIPASE", "AMYLASE" in the last 168 hours. No results for input(s): "AMMONIA" in the last 168 hours. Coagulation Profile: Recent Labs  Lab  11/08/21 1838 11/10/21 1625 11/13/21 1135  INR 1.5* 1.3* 1.4*   Cardiac Enzymes: No results for input(s): "CKTOTAL", "CKMB", "CKMBINDEX", "TROPONINI" in the last 168 hours. BNP (last 3 results) No results for input(s): "PROBNP" in the last 8760 hours. HbA1C: No results for input(s): "HGBA1C" in the last 72 hours. CBG: Recent Labs  Lab 11/12/21 1958 11/13/21 0512 11/13/21 0811 11/13/21 1201 11/13/21 1646  GLUCAP 100* 100* 84 107* 104*   Lipid Profile: No results for input(s): "CHOL", "HDL", "LDLCALC", "TRIG", "CHOLHDL", "LDLDIRECT" in the last 72 hours. Thyroid Function Tests: No results for input(s): "TSH", "T4TOTAL", "FREET4", "T3FREE", "THYROIDAB" in the last 72 hours. Anemia Panel: No results for input(s): "VITAMINB12", "FOLATE", "FERRITIN", "TIBC", "IRON", "RETICCTPCT" in the last 72 hours. Sepsis Labs: Recent Labs  Lab 11/10/21 0817  PROCALCITON 3.96    No results found for this or any previous visit (from the past 240 hour(s)).        Radiology Studies: No results found.      Scheduled Meds:  amiodarone  200 mg Oral BID   apixaban  2.5 mg Oral BID   carvedilol  12.5 mg Oral BID WC   Chlorhexidine Gluconate Cloth  6 each Topical Q0600   cinacalcet  60 mg Oral QAC lunch   feeding supplement (NEPRO CARB STEADY)  237 mL Oral TID BM   lidocaine  1 patch Transdermal Q24H   multivitamin  1 tablet Oral QHS   polyethylene glycol  17 g Oral Daily   senna-docusate  1 tablet Oral BID   sertraline  100 mg Oral Daily   sevelamer carbonate  2,400 mg Oral TID with meals   traZODone  50 mg Oral QHS   zolpidem  5 mg Oral QHS   Continuous Infusions:  sodium chloride 250 mL (11/12/21 2214)   anticoagulant sodium citrate       LOS: 10 days      Sidney Ace, MD [facility]   If 7PM-7AM, please contact night-coverage  11/15/2021, 11:45 AM

## 2021-11-15 NOTE — Progress Notes (Addendum)
Physical Therapy Treatment Patient Details Name: Teresa Cook MRN: [id number] DOB: 11-18-1955 Today's Date: 11/15/2021   History of Present Illness Patient is a 66 year old female with medical history significant for ESRD on HD, A. Fib & DVT on Warfarin , admitted with Hemorrhagic shock due to right renal hemorrhage with large hematoma in setting of supratherapeutic INR (INR >9).  Vascular Surgery consulted, required coil embolization of the right renal artery.    PT Comments    Pt found supine in bed upon PT entry with c/o low back pain. Supine<>Sit Mod I. Sit<>Stand with CGA, RW, and min verbal cueing for RW hand placement. Pt ambulated 220 ft with CGA and RW. Per pt one episode of "unsteadiness" during ambulation, corrected with CGA. HR remained WNL throughout mobility, spO2 at end of mobility 92% on room air. Pt would benefit from skilled physical therapy to address the listed deficits (see below) to increase independence with ADLs and safety. Current recommendation is HHPT with intermittent supervision to return pt to PLOF.      Recommendations for follow up therapy are one component of a multi-disciplinary discharge planning process, led by the attending physician.  Recommendations may be updated based on patient status, additional functional criteria and insurance authorization.  Follow Up Recommendations  Home health PT     Assistance Recommended at Discharge Intermittent Supervision/Assistance  Patient can return home with the following A little help with walking and/or transfers;A little help with bathing/dressing/bathroom;Help with stairs or ramp for entrance;Assist for transportation;Assistance with cooking/housework   Equipment Recommendations  Rolling walker (2 wheels)    Recommendations for Other Services       Precautions / Restrictions Precautions Precautions: Fall Precaution Comments: No BP L UE Restrictions Weight Bearing Restrictions: No     Mobility  Bed  Mobility Overal bed mobility: Modified Independent Bed Mobility: Supine to Sit     Supine to sit: Modified independent (Device/Increase time)          Transfers Overall transfer level: Needs assistance Equipment used: Rolling walker (2 wheels) Transfers: Sit to/from Stand Sit to Stand: Min guard                Ambulation/Gait Ambulation/Gait assistance: Counsellor (Feet): 220 Feet Assistive device: Rolling walker (2 wheels) Gait Pattern/deviations: WFL(Within Functional Limits), Step-through pattern       General Gait Details: pt ambulated with one epsiode of unsteadiness on feet but was able to self correct   Stairs             Wheelchair Mobility    Modified Rankin (Stroke Patients Only)       Balance Overall balance assessment: Needs assistance Sitting-balance support: No upper extremity supported, Feet supported Sitting balance-Leahy Scale: Good     Standing balance support: Bilateral upper extremity supported Standing balance-Leahy Scale: Fair Standing balance comment: able to maintain static standing balance and let go of RW with bilateral UE                            Cognition Arousal/Alertness: Awake/alert Behavior During Therapy: WFL for tasks assessed/performed Overall Cognitive Status: Within Functional Limits for tasks assessed                                          Exercises      General Comments  Pertinent Vitals/Pain Pain Assessment Faces Pain Scale: Hurts a little bit Pain Location: R low back Pain Descriptors / Indicators: Guarding, Grimacing Pain Intervention(s): Repositioned, Monitored during session    Home Living                          Prior Function            PT Goals (current goals can now be found in the care plan section) Acute Rehab PT Goals Patient Stated Goal: to go home PT Goal Formulation: With patient Time For Goal Achievement:  11/22/21 Potential to Achieve Goals: Good Progress towards PT goals: Progressing toward goals    Frequency    Min 2X/week      PT Plan Current plan remains appropriate    Co-evaluation              AM-PAC PT "6 Clicks" Mobility   Outcome Measure  Help needed turning from your back to your side while in a flat bed without using bedrails?: None Help needed moving from lying on your back to sitting on the side of a flat bed without using bedrails?: None Help needed moving to and from a bed to a chair (including a wheelchair)?: A Little Help needed standing up from a chair using your arms (e.g., wheelchair or bedside chair)?: A Little Help needed to walk in hospital room?: A Little Help needed climbing 3-5 steps with a railing? : A Little 6 Click Score: 20    End of Session Equipment Utilized During Treatment: Gait belt Activity Tolerance: Patient tolerated treatment well Patient left: in chair;with call bell/phone within reach;with chair alarm set Nurse Communication: Mobility status PT Visit Diagnosis: Unsteadiness on feet (R26.81);Muscle weakness (generalized) (M62.81)     Time: 2233-6122 PT Time Calculation (min) (ACUTE ONLY): 18 min  Charges:  $Therapeutic Activity: 8-22 mins                     [employer], SPT  11/15/2021, 12:29 PM

## 2021-11-15 NOTE — Plan of Care (Signed)

## 2021-11-15 NOTE — Care Management Important Message (Signed)
Important Message  Patient Details  Name: Teresa Cook MRN: [id number] Date of Birth: 1955/05/09   Medicare Important Message Given:  Yes     [name] 11/15/2021, 2:29 PM

## 2021-11-15 NOTE — Progress Notes (Signed)
       CROSS COVER NOTE  NAME: Shaunte Tuft MRN: [id number] DOB : 06/11/1955    Time of Service   0550  HPI/Events of Note   Contacted by nurse for clarity regarding obtaining blood for lab from femoral line.    Assessment and  Interventions   Assessment: Line placed 10/30. Plan: OK to obtain blood for labs frp, fe,pra; triple lumen IV team consult to obtain peripheral access D?C central line       Kathlene Cote NP [facility]

## 2021-11-15 NOTE — Progress Notes (Signed)
[facility]  ROUNDING NOTE   Subjective:   Teresa Cook is a 66 year old African-American female with past medical conditions including hypertension, gout, anxiety with depression, sickle cell trait, systolic heart failure with a EF 40 to 45%, breast cancer with bilateral mastectomy, seizures, and end-stage renal disease on hemodialysis.  Patient presents to the emergency department via EMS for multiple syncopal episodes.  Patient has been admitted for Right flank pain [R10.9] Elevated lactic acid level [R79.89] Hypotension [I95.9] Hypotension, unspecified hypotension type [I95.9] Altered mental status, unspecified altered mental status type [R41.82]  Patient is known to our practice and receives outpatient dialysis treatments at [facility] on a MWF schedule, supervised by Dr. Candiss Norse.    Patient resting quietly, tolerating meals Alert and oriented  No signs of bleeding.  Objective:  Vital signs in last 24 hours:  Temp:  [98.5 F (36.9 C)-99.7 F (37.6 C)] 98.9 F (37.2 C) (11/09 0958) Pulse Rate:  [75-96] 88 (11/09 0958) Resp:  [18-28] 20 (11/09 0958) BP: (112-158)/(63-96) 158/90 (11/09 0958) SpO2:  [92 %-98 %] 92 % (11/09 0958) Weight:  [69.1 kg] 69.1 kg (11/08 1312)  Weight change: 4.5 kg Filed Weights   11/14/21 0500 11/14/21 0955 11/14/21 1312  Weight: 65.1 kg 69.6 kg 69.1 kg    Intake/Output: I/O last 3 completed shifts: In: 89 [P.O.:720] Out: 503 [Other:500; Stool:3]   Intake/Output this shift:  No intake/output data recorded.  Physical Exam: General: NAD  Head: Normocephalic, atraumatic. Moist oral mucosal membranes  Eyes: Anicteric  Lungs:  Clear to auscultation, normal effort, room air  Heart: Regular rate and rhythm  Abdomen:  Soft, nontender  Extremities:  No peripheral edema.  Neurologic: Nonfocal, moving all four extremities  Skin: No lesions  Access: Lt Permcath    Basic Metabolic Panel: Recent Labs  Lab  11/09/21 0515 11/10/21 0440 11/11/21 0528 11/12/21 0452 11/13/21 0635 11/14/21 0633 11/15/21 0600  NA 135 131*  --   --   --  135  --   K 4.0 3.7 3.4* 3.9  --  3.6  --   CL 93* 91*  --   --   --  98  --   CO2 29 27  --   --   --  26  --   GLUCOSE 106* 106*  --   --   --  83  --   BUN 25* 40*  --   --   --  37*  --   CREATININE 4.88* 6.75*  --   --   --  7.13*  --   CALCIUM 9.5 10.0  --   --   --  9.5  --   MG 2.2 2.2 1.9 2.0 1.9 1.9 1.7  PHOS 5.4* 6.3* 4.3 4.8* 4.2 5.2*  5.3* 3.8     Liver Function Tests: Recent Labs  Lab 11/14/21 9030  ALBUMIN 1.9*    No results for input(s): "LIPASE", "AMYLASE" in the last 168 hours. No results for input(s): "AMMONIA" in the last 168 hours.  CBC: Recent Labs  Lab 11/11/21 0528 11/12/21 0452 11/13/21 0635 11/14/21 0633 11/15/21 0600  WBC 6.6 7.2 7.0 6.9 6.8  NEUTROABS 4.6 4.9 4.8 4.5 4.6  HGB 8.2* 8.8* 8.1* 7.9* 7.7*  HCT 22.9* 25.1* 23.0* 22.5* 21.6*  MCV 78.7* 79.4* 78.0* 79.5* 79.4*  PLT 147* 186 193 213 230     Cardiac Enzymes: No results for input(s): "CKTOTAL", "CKMB", "CKMBINDEX", "TROPONINI" in the last 168 hours.  BNP: Invalid  input(s): "POCBNP"  CBG: Recent Labs  Lab 11/12/21 1958 11/13/21 0512 11/13/21 0811 11/13/21 1201 11/13/21 1646  GLUCAP 100* 100* 13 107* 104*     Microbiology: Results for orders placed or performed during the hospital encounter of 11/05/21  Blood Culture (routine x 2)     Status: None   Collection Time: 11/05/21  3:20 AM   Specimen: BLOOD  Result Value Ref Range Status   Specimen Description BLOOD RIGHT FA  Final   Special Requests   Final    BOTTLES DRAWN AEROBIC AND ANAEROBIC Blood Culture results may not be optimal due to an inadequate volume of blood received in culture bottles   Culture   Final    NO GROWTH 5 DAYS Performed at [facility], [address] [place], [place] [address]    Report Status 11/10/2021 FINAL  Final  SARS Coronavirus 2 by RT PCR  (hospital order, performed in [facility] hospital lab) *cepheid single result test* Anterior Nasal Swab     Status: None   Collection Time: 11/05/21  3:20 AM   Specimen: Anterior Nasal Swab  Result Value Ref Range Status   SARS Coronavirus 2 by RT PCR NEGATIVE NEGATIVE Final    Comment: (NOTE) SARS-CoV-2 target nucleic acids are NOT DETECTED.  The SARS-CoV-2 RNA is generally detectable in upper and lower respiratory specimens during the acute phase of infection. The lowest concentration of SARS-CoV-2 viral copies this assay can detect is 250 copies / mL. A negative result does not preclude SARS-CoV-2 infection and should not be used as the sole basis for treatment or other patient management decisions.  A negative result may occur with improper specimen collection / handling, submission of specimen other than nasopharyngeal swab, presence of viral mutation(s) within the areas targeted by this assay, and inadequate number of viral copies (<250 copies / mL). A negative result must be combined with clinical observations, patient history, and epidemiological information.  Fact Sheet for Patients:   https://www.patel.info/  Fact Sheet for Healthcare Providers: https://hall.com/  This test is not yet approved or  cleared by the [place] FDA and has been authorized for detection and/or diagnosis of SARS-CoV-2 by FDA under an Emergency Use Authorization (EUA).  This EUA will remain in effect (meaning this test can be used) for the duration of the COVID-19 declaration under Section 564(b)(1) of the Act, 21 U.S.C. section 360bbb-3(b)(1), unless the authorization is terminated or revoked sooner.  Performed at [facility], [address] [place], [place] [address]   Blood Culture (routine x 2)     Status: None   Collection Time: 11/05/21  3:21 AM   Specimen: BLOOD  Result Value Ref Range Status   Specimen Description BLOOD RIGHT  HAND  Final   Special Requests   Final    BOTTLES DRAWN AEROBIC AND ANAEROBIC Blood Culture results may not be optimal due to an inadequate volume of blood received in culture bottles   Culture   Final    NO GROWTH 5 DAYS Performed at [facility], [address] [place], [place] [address]    Report Status 11/10/2021 FINAL  Final  MRSA Next Gen by PCR, Nasal     Status: None   Collection Time: 11/05/21  6:57 AM   Specimen: Nasal Mucosa; Nasal Swab  Result Value Ref Range Status   MRSA by PCR Next Gen NOT DETECTED NOT DETECTED Final    Comment: (NOTE) The GeneXpert MRSA Assay (FDA approved for NASAL specimens  only), is one component of a comprehensive MRSA colonization surveillance program. It is not intended to diagnose MRSA infection nor to guide or monitor treatment for MRSA infections. Test performance is not FDA approved in patients less than 51 years old. Performed at [facility], [address] [place], [place] [address]     Coagulation Studies: Recent Labs    11/13/21 1135  LABPROT 16.8*  INR 1.4*     Urinalysis: No results for input(s): "COLORURINE", "LABSPEC", "PHURINE", "GLUCOSEU", "HGBUR", "BILIRUBINUR", "KETONESUR", "PROTEINUR", "UROBILINOGEN", "NITRITE", "LEUKOCYTESUR" in the last 72 hours.  Invalid input(s): "APPERANCEUR"    Imaging: No results found.   Medications:    sodium chloride 250 mL (11/12/21 2214)   anticoagulant sodium citrate      amiodarone  200 mg Oral BID   apixaban  2.5 mg Oral BID   carvedilol  12.5 mg Oral BID WC   Chlorhexidine Gluconate Cloth  6 each Topical Q0600   cinacalcet  60 mg Oral QAC lunch   feeding supplement (NEPRO CARB STEADY)  237 mL Oral TID BM   lidocaine  1 patch Transdermal Q24H   multivitamin  1 tablet Oral QHS   polyethylene glycol  17 g Oral Daily   senna-docusate  1 tablet Oral BID   sertraline  100 mg Oral Daily   sevelamer carbonate  2,400 mg Oral TID with meals   traZODone  50  mg Oral QHS   zolpidem  5 mg Oral QHS   acetaminophen, ALPRAZolam, alteplase, anticoagulant sodium citrate, bisacodyl, fluticasone, heparin, HYDROmorphone (DILAUDID) injection, lidocaine (PF), lidocaine-prilocaine, loperamide, methocarbamol, metoprolol tartrate, ondansetron (ZOFRAN) IV, mouth rinse, pentafluoroprop-tetrafluoroeth, sodium chloride  Assessment/ Plan:  [name] is a 66 y.o.  female is a 66 year old African-American female with past medical conditions including hypertension, gout, anxiety with depression, sickle cell trait, systolic heart failure with a EF 40 to 45%, breast cancer with bilateral mastectomy, seizures, and end-stage renal disease on hemodialysis.  Patient presents to the emergency department via EMS for multiple syncopal episodes.  Patient has been admitted for Right flank pain [R10.9] Elevated lactic acid level [R79.89] Hypotension [I95.9] Hypotension, unspecified hypotension type [I95.9] Altered mental status, unspecified altered mental status type [R41.82]  [facility]/MWF/Lt Permcath  End-stage renal disease on hemodialysis.  Will maintain outpatient schedule if possible.  Dialysis scheduled for this morning, UF goal 0.5L as tolerated.   2. Anemia of chronic kidney disease with acute blood loss Lab Results  Component Value Date   HGB 7.7 (L) 11/15/2021  CT angio abdomen pelvis shows severe hemorrhage at right kidney.  Appreciate vascular surgery performing right renal angiogram with coil embolization to manage hemorrhage.  Patient received 2 units FFP and blood transfusions thus far during admission.  Hgb remains low but stable. Transitioned to Eliquis, no signs of bleeding    3. Secondary Hyperparathyroidism: with outpatient labs: PTH 379, phosphorus 5.7, calcium 9.4 on June 18, 2021.   Lab Results  Component Value Date   PTH 214 (H) 07/08/2021   CALCIUM 9.5 11/14/2021   PHOS 3.8 11/15/2021  Calcium and phosphorus within desired  goal.  Continue sevelamer with all meals.  4.  Hypotension likely due to blood loss, Currently weaned off pressors.  Amiodarone twice daily  5.  Hyperkalemia, potassium corrected    LOS: [place] 11/9/202311:40 AM

## 2021-11-16 DIAGNOSIS — R58 Hemorrhage, not elsewhere classified: Secondary | ICD-10-CM | POA: Diagnosis not present

## 2021-11-16 DIAGNOSIS — R579 Shock, unspecified: Secondary | ICD-10-CM | POA: Diagnosis not present

## 2021-11-16 DIAGNOSIS — N186 End stage renal disease: Secondary | ICD-10-CM | POA: Diagnosis not present

## 2021-11-16 DIAGNOSIS — I4891 Unspecified atrial fibrillation: Secondary | ICD-10-CM | POA: Diagnosis not present

## 2021-11-16 LAB — RENAL FUNCTION PANEL
Albumin: 2.2 g/dL — ABNORMAL LOW (ref 3.5–5.0)
Anion gap: 8 (ref 5–15)
BUN: 37 mg/dL — ABNORMAL HIGH (ref 8–23)
CO2: 27 mmol/L (ref 22–32)
Calcium: 9.1 mg/dL (ref 8.9–10.3)
Chloride: 100 mmol/L (ref 98–111)
Creatinine, Ser: 7.01 mg/dL — ABNORMAL HIGH (ref 0.44–1.00)
GFR, Estimated: 6 mL/min — ABNORMAL LOW (ref >60)
Glucose, Bld: 86 mg/dL (ref 70–99)
Phosphorus: 4.2 mg/dL (ref 2.5–4.6)
Potassium: 3.6 mmol/L (ref 3.5–5.1)
Sodium: 135 mmol/L (ref 135–145)

## 2021-11-16 LAB — CBC WITH DIFFERENTIAL/PLATELET
Abs Immature Granulocytes: 0.14 10*3/uL — ABNORMAL HIGH (ref 0.00–0.07)
Basophils Absolute: 0 10*3/uL (ref 0.0–0.1)
Basophils Relative: 0 %
Eosinophils Absolute: 0.2 10*3/uL (ref 0.0–0.5)
Eosinophils Relative: 2 %
HCT: 22.6 % — ABNORMAL LOW (ref 36.0–46.0)
Hemoglobin: 7.7 g/dL — ABNORMAL LOW (ref 12.0–15.0)
Immature Granulocytes: 2 %
Lymphocytes Relative: 16 %
Lymphs Abs: 1.2 10*3/uL (ref 0.7–4.0)
MCH: 27.5 pg (ref 26.0–34.0)
MCHC: 34.1 g/dL (ref 30.0–36.0)
MCV: 80.7 fL (ref 80.0–100.0)
Monocytes Absolute: 0.8 10*3/uL (ref 0.1–1.0)
Monocytes Relative: 11 %
Neutro Abs: 4.9 10*3/uL (ref 1.7–7.7)
Neutrophils Relative %: 69 %
Platelets: 249 10*3/uL (ref 150–400)
RBC: 2.8 MIL/uL — ABNORMAL LOW (ref 3.87–5.11)
RDW: 16.7 % — ABNORMAL HIGH (ref 11.5–15.5)
WBC: 7.2 10*3/uL (ref 4.0–10.5)
nRBC: 0 % (ref 0.0–0.2)

## 2021-11-16 LAB — MAGNESIUM: Magnesium: 2 mg/dL (ref 1.7–2.4)

## 2021-11-16 LAB — PHOSPHORUS: Phosphorus: 4.4 mg/dL (ref 2.5–4.6)

## 2021-11-16 MED ORDER — PANTOPRAZOLE SODIUM 40 MG PO TBEC
40.0000 mg | DELAYED_RELEASE_TABLET | Freq: Two times a day (BID) | ORAL | Status: DC
  Administered 2021-11-16 – 2021-11-22 (×12): 40 mg via ORAL
  Filled 2021-11-16 (×12): qty 1

## 2021-11-16 MED ORDER — PANTOPRAZOLE SODIUM 40 MG PO TBEC: 40.0000 mg | DELAYED_RELEASE_TABLET | Freq: Two times a day (BID) | ORAL | Status: DC

## 2021-11-16 NOTE — Progress Notes (Signed)
Pre hd rn assessment 

## 2021-11-16 NOTE — Progress Notes (Signed)
Physical Therapy Treatment Patient Details Name: Teresa Cook MRN: [id number] DOB: Jun 20, 1955 Today's Date: 11/16/2021   History of Present Illness Patient is a 66 year old female with medical history significant for ESRD on HD, A. Fib & DVT on Warfarin , admitted with Hemorrhagic shock due to right renal hemorrhage with large hematoma in setting of supratherapeutic INR (INR >9).  Vascular Surgery consulted, required coil embolization of the right renal artery.    PT Comments    Pt found supine in bed upon PT entry with c/o fatigue s/p hemodialysis.  Pt refused mobility, but agreed to bed level exercises. Pt required no assistance with bed level exercises and reported she has been consistent with them. Pt would benefit from skilled physical therapy to address the listed deficits (see below) to increase independence with ADLs and function. Current recommendation is HHPT with intermittent assistance to return pt to PLOF.    Recommendations for follow up therapy are one component of a multi-disciplinary discharge planning process, led by the attending physician.  Recommendations may be updated based on patient status, additional functional criteria and insurance authorization.  Follow Up Recommendations  Home health PT     Assistance Recommended at Discharge Intermittent Supervision/Assistance  Patient can return home with the following A little help with walking and/or transfers;A little help with bathing/dressing/bathroom;Help with stairs or ramp for entrance;Assist for transportation;Assistance with cooking/housework   Equipment Recommendations  Rolling walker (2 wheels)    Recommendations for Other Services       Precautions / Restrictions Precautions Precautions: Fall Precaution Comments: No BP L UE Restrictions Weight Bearing Restrictions: No     Mobility  Bed Mobility                    Transfers                        Ambulation/Gait                    Stairs             Wheelchair Mobility    Modified Rankin (Stroke Patients Only)       Balance                                            Cognition Arousal/Alertness: Awake/alert Behavior During Therapy: WFL for tasks assessed/performed Overall Cognitive Status: Within Functional Limits for tasks assessed                                          Exercises General Exercises - Lower Extremity Ankle Circles/Pumps: AROM, Strengthening, Both, 10 reps, Seated Short Arc Quad: AROM, Strengthening, Right, Left, 10 reps Heel Slides: AROM, Strengthening, Right, Left, 10 reps Hip ABduction/ADduction: AROM, Strengthening, Right, Left, 10 reps    General Comments        Pertinent Vitals/Pain Pain Assessment Faces Pain Scale: Hurts a little bit Pain Location: R low back Pain Descriptors / Indicators: Guarding, Grimacing Pain Intervention(s): Limited activity within patient's tolerance, Monitored during session    Home Living                          Prior Function  PT Goals (current goals can now be found in the care plan section) Acute Rehab PT Goals Patient Stated Goal: to go home PT Goal Formulation: With patient Time For Goal Achievement: 11/22/21 Potential to Achieve Goals: Good Progress towards PT goals: Progressing toward goals    Frequency    Min 2X/week      PT Plan Current plan remains appropriate    Co-evaluation              AM-PAC PT "6 Clicks" Mobility   Outcome Measure  Help needed turning from your back to your side while in a flat bed without using bedrails?: A Little Help needed moving from lying on your back to sitting on the side of a flat bed without using bedrails?: A Little Help needed moving to and from a bed to a chair (including a wheelchair)?: A Little Help needed standing up from a chair using your arms (e.g., wheelchair or bedside chair)?: A  Little Help needed to walk in hospital room?: A Little Help needed climbing 3-5 steps with a railing? : A Little 6 Click Score: 18    End of Session Equipment Utilized During Treatment: Other (comment) (pt remained supine in bed) Activity Tolerance: Patient tolerated treatment well Patient left: in bed;with bed alarm set;with call bell/phone within reach Nurse Communication: Mobility status PT Visit Diagnosis: Unsteadiness on feet (R26.81);Muscle weakness (generalized) (M62.81)     Time: 1188-6773 PT Time Calculation (min) (ACUTE ONLY): 12 min  Charges:                        Claiborne Billings O'Daniel, SPT  11/16/2021, 2:17 PM

## 2021-11-16 NOTE — Progress Notes (Signed)
[facility]  ROUNDING NOTE   Subjective:   Teresa Cook is a 66 year old African-American female with past medical conditions including hypertension, gout, anxiety with depression, sickle cell trait, systolic heart failure with a EF 40 to 45%, breast cancer with bilateral mastectomy, seizures, and end-stage renal disease on hemodialysis.  Patient presents to the emergency department via EMS for multiple syncopal episodes.  Patient has been admitted for Right flank pain [R10.9] Elevated lactic acid level [R79.89] Hypotension [I95.9] Hypotension, unspecified hypotension type [I95.9] Altered mental status, unspecified altered mental status type [R41.82]  Patient is known to our practice and receives outpatient dialysis treatments at [facility] on a MWF schedule, supervised by Dr. Candiss Norse.    Patient seen and evaluated during dialysis   HEMODIALYSIS FLOWSHEET:  Blood Flow Rate (mL/min): 350 mL/min Arterial Pressure (mmHg): -180 mmHg Venous Pressure (mmHg): 260 mmHg TMP (mmHg): 4 mmHg Ultrafiltration Rate (mL/min): 316 mL/min Dialysate Flow Rate (mL/min): 300 ml/min Dialysis Fluid Bolus: Normal Saline Bolus Amount (mL): 300 mL  Patient reports no signs of bleeding Reports abdominal fullness, states she experiences this prior to admission with outpatient treatments as well  Objective:  Vital signs in last 24 hours:  Temp:  [97.3 F (36.3 C)-99.4 F (37.4 C)] 98.2 F (36.8 C) (11/10 1154) Pulse Rate:  [71-82] 77 (11/10 1154) Resp:  [15-22] 17 (11/10 1154) BP: (117-142)/(74-87) 142/77 (11/10 1154) SpO2:  [90 %-100 %] 90 % (11/10 1154) Weight:  [63.6 kg-63.9 kg] 63.6 kg (11/10 1132)  Weight change:  Filed Weights   11/14/21 1312 11/16/21 0829 11/16/21 1132  Weight: 69.1 kg 63.9 kg 63.6 kg    Intake/Output: No intake/output data recorded.   Intake/Output this shift:  Total I/O In: -  Out: 0.5 [Other:0.5]  Physical Exam: General: NAD  Head:  Normocephalic, atraumatic. Moist oral mucosal membranes  Eyes: Anicteric  Lungs:  Clear to auscultation, normal effort, room air  Heart: Regular rate and rhythm  Abdomen:  Soft, nontender  Extremities:  No peripheral edema.  Neurologic: Nonfocal, moving all four extremities  Skin: No lesions  Access: Lt Permcath    Basic Metabolic Panel: Recent Labs  Lab 11/10/21 0440 11/11/21 0528 11/12/21 0452 11/13/21 0635 11/14/21 0633 11/15/21 0600 11/16/21 0547  NA 131*  --   --   --  135  --  135  K 3.7 3.4* 3.9  --  3.6  --  3.6  CL 91*  --   --   --  98  --  100  CO2 27  --   --   --  26  --  27  GLUCOSE 106*  --   --   --  83  --  86  BUN 40*  --   --   --  37*  --  37*  CREATININE 6.75*  --   --   --  7.13*  --  7.01*  CALCIUM 10.0  --   --   --  9.5  --  9.1  MG 2.2 1.9 2.0 1.9 1.9 1.7 2.0  PHOS 6.3* 4.3 4.8* 4.2 5.2*  5.3* 3.8 4.2  4.4     Liver Function Tests: Recent Labs  Lab 11/14/21 0633 11/16/21 0547  ALBUMIN 1.9* 2.2*    No results for input(s): "LIPASE", "AMYLASE" in the last 168 hours. No results for input(s): "AMMONIA" in the last 168 hours.  CBC: Recent Labs  Lab 11/12/21 0452 11/13/21 0635 11/14/21 0633 11/15/21 0600 11/16/21 0547  WBC  7.2 7.0 6.9 6.8 7.2  NEUTROABS 4.9 4.8 4.5 4.6 4.9  HGB 8.8* 8.1* 7.9* 7.7* 7.7*  HCT 25.1* 23.0* 22.5* 21.6* 22.6*  MCV 79.4* 78.0* 79.5* 79.4* 80.7  PLT 186 193 213 230 249     Cardiac Enzymes: No results for input(s): "CKTOTAL", "CKMB", "CKMBINDEX", "TROPONINI" in the last 168 hours.  BNP: Invalid input(s): "POCBNP"  CBG: Recent Labs  Lab 11/12/21 1958 11/13/21 0512 11/13/21 0811 11/13/21 1201 11/13/21 1646  GLUCAP 100* 100* 35 107* 104*     Microbiology: Results for orders placed or performed during the hospital encounter of 11/05/21  Blood Culture (routine x 2)     Status: None   Collection Time: 11/05/21  3:20 AM   Specimen: BLOOD  Result Value Ref Range Status   Specimen Description  BLOOD RIGHT FA  Final   Special Requests   Final    BOTTLES DRAWN AEROBIC AND ANAEROBIC Blood Culture results may not be optimal due to an inadequate volume of blood received in culture bottles   Culture   Final    NO GROWTH 5 DAYS Performed at [facility], [address] [place], [place] [address]    Report Status 11/10/2021 FINAL  Final  SARS Coronavirus 2 by RT PCR (hospital order, performed in [facility] hospital lab) *cepheid single result test* Anterior Nasal Swab     Status: None   Collection Time: 11/05/21  3:20 AM   Specimen: Anterior Nasal Swab  Result Value Ref Range Status   SARS Coronavirus 2 by RT PCR NEGATIVE NEGATIVE Final    Comment: (NOTE) SARS-CoV-2 target nucleic acids are NOT DETECTED.  The SARS-CoV-2 RNA is generally detectable in upper and lower respiratory specimens during the acute phase of infection. The lowest concentration of SARS-CoV-2 viral copies this assay can detect is 250 copies / mL. A negative result does not preclude SARS-CoV-2 infection and should not be used as the sole basis for treatment or other patient management decisions.  A negative result may occur with improper specimen collection / handling, submission of specimen other than nasopharyngeal swab, presence of viral mutation(s) within the areas targeted by this assay, and inadequate number of viral copies (<250 copies / mL). A negative result must be combined with clinical observations, patient history, and epidemiological information.  Fact Sheet for Patients:   https://www.patel.info/  Fact Sheet for Healthcare Providers: https://hall.com/  This test is not yet approved or  cleared by the [place] FDA and has been authorized for detection and/or diagnosis of SARS-CoV-2 by FDA under an Emergency Use Authorization (EUA).  This EUA will remain in effect (meaning this test can be used) for the duration of the COVID-19  declaration under Section 564(b)(1) of the Act, 21 U.S.C. section 360bbb-3(b)(1), unless the authorization is terminated or revoked sooner.  Performed at [facility], [address] [place], [place] [address]   Blood Culture (routine x 2)     Status: None   Collection Time: 11/05/21  3:21 AM   Specimen: BLOOD  Result Value Ref Range Status   Specimen Description BLOOD RIGHT HAND  Final   Special Requests   Final    BOTTLES DRAWN AEROBIC AND ANAEROBIC Blood Culture results may not be optimal due to an inadequate volume of blood received in culture bottles   Culture   Final    NO GROWTH 5 DAYS Performed at [facility], [address] [place], [place] [address]    Report Status 11/10/2021 FINAL  Final  MRSA Next Gen by PCR, Nasal     Status: None   Collection Time: 11/05/21  6:57 AM   Specimen: Nasal Mucosa; Nasal Swab  Result Value Ref Range Status   MRSA by PCR Next Gen NOT DETECTED NOT DETECTED Final    Comment: (NOTE) The GeneXpert MRSA Assay (FDA approved for NASAL specimens only), is one component of a comprehensive MRSA colonization surveillance program. It is not intended to diagnose MRSA infection nor to guide or monitor treatment for MRSA infections. Test performance is not FDA approved in patients less than 40 years old. Performed at [facility], [address] [place], [place] [address]     Coagulation Studies: No results for input(s): "LABPROT", "INR" in the last 72 hours.   Urinalysis: No results for input(s): "COLORURINE", "LABSPEC", "PHURINE", "GLUCOSEU", "HGBUR", "BILIRUBINUR", "KETONESUR", "PROTEINUR", "UROBILINOGEN", "NITRITE", "LEUKOCYTESUR" in the last 72 hours.  Invalid input(s): "APPERANCEUR"    Imaging: No results found.   Medications:    sodium chloride 250 mL (11/12/21 2214)   anticoagulant sodium citrate      amiodarone  200 mg Oral BID   apixaban  2.5 mg Oral BID   carvedilol  12.5 mg Oral BID WC    Chlorhexidine Gluconate Cloth  6 each Topical Q0600   cinacalcet  60 mg Oral QAC lunch   famotidine  20 mg Oral Daily   feeding supplement (NEPRO CARB STEADY)  237 mL Oral TID BM   lidocaine  1 patch Transdermal Q24H   multivitamin  1 tablet Oral QHS   polyethylene glycol  17 g Oral Daily   sacubitril-valsartan  1 tablet Oral BID   senna-docusate  1 tablet Oral BID   sertraline  100 mg Oral Daily   sevelamer carbonate  2,400 mg Oral TID with meals   traZODone  50 mg Oral QHS   zolpidem  5 mg Oral QHS   acetaminophen, ALPRAZolam, alteplase, alum & mag hydroxide-simeth, anticoagulant sodium citrate, bisacodyl, fluticasone, heparin, HYDROmorphone (DILAUDID) injection, lidocaine (PF), lidocaine-prilocaine, loperamide, methocarbamol, metoprolol tartrate, ondansetron (ZOFRAN) IV, mouth rinse, pentafluoroprop-tetrafluoroeth, sodium chloride  Assessment/ Plan:  Ms. Xitlali Kastens is a 66 y.o.  female is a 66 year old African-American female with past medical conditions including hypertension, gout, anxiety with depression, sickle cell trait, systolic heart failure with a EF 40 to 45%, breast cancer with bilateral mastectomy, seizures, and end-stage renal disease on hemodialysis.  Patient presents to the emergency department via EMS for multiple syncopal episodes.  Patient has been admitted for Right flank pain [R10.9] Elevated lactic acid level [R79.89] Hypotension [I95.9] Hypotension, unspecified hypotension type [I95.9] Altered mental status, unspecified altered mental status type [R41.82]  [facility]/MWF/Lt Permcath  End-stage renal disease on hemodialysis.  Will maintain outpatient schedule if possible.  Patient received dialysis today, UF goal 0.5 L achieved.  Next treatment scheduled for Monday.  2. Anemia of chronic kidney disease with acute blood loss Lab Results  Component Value Date   HGB 7.7 (L) 11/16/2021  CT angio abdomen pelvis shows severe hemorrhage at right kidney.   Appreciate vascular surgery performing right renal angiogram with coil embolization to manage hemorrhage.  Patient received 2 units FFP and blood transfusions thus far during admission.  Hemoglobin remains decreased but stable.  No signs of blood loss with transition to Eliquis.  We will consider EPO with dialysis treatments.   3. Secondary Hyperparathyroidism: with outpatient labs: PTH 379, phosphorus 5.7, calcium 9.4 on June 18, 2021.   Lab Results  Component Value Date   PTH 214 (H) 07/08/2021   CALCIUM 9.1 11/16/2021   PHOS 4.2 11/16/2021   PHOS 4.4 11/16/2021  We will monitor bone minerals during this admission. Continue sevelamer with all meals.  4.  Hypotension likely due to blood loss, Currently weaned off pressors.  Resolved.  5.  Hyperkalemia, potassium corrected    LOS: [facility] 11/10/202312:21 PM

## 2021-11-16 NOTE — Progress Notes (Signed)
Pt HD start at 0821. Pt alert, no c/o, vss. Catheter lines reversed and BFR 350 per patient norm. Sherlyn Hay, NP present and aware at bedside. Safety maintained, will continue to monitor.

## 2021-11-16 NOTE — Progress Notes (Signed)
PROGRESS NOTE    Teresa Cook  LSL:[id number] DOB: May 23, 1955 DOA: 11/05/2021 PCP: [facility] [place]    Brief Narrative:  66 y.o female with PMH significant for ESRD on HD, A. Fib & DVT on Warfarin , admitted on 10/30 with Hemorrhagic shock, requiring pressor support initially, due to right renal hemorrhage with large hematoma in setting of supratherapeutic INR (INR >9).  Vascular Surgery consulted, required coil embolization of the right renal artery.  By 11/3, able to be weaned off of pressor support.  Transferred to the hospitalist service by 11/4.  Challenged with IV heparin on 11/7 and transitioned to p.o. Eliquis on 11/8.  Hemoglobin has since remained stable.   Assessment & Plan:   Principal Problem:   Shock ([place]) Active Problems:   Acute respiratory failure with hypoxia ([place])   ESRD on dialysis ([place])   End-stage renal disease on hemodialysis (H[facility])   HTN (hypertension)   PAF (paroxysmal atrial fibrillation) ([facility])   Hypotension   Anemia due to acute blood loss   Right flank pain   Retroperitoneal bleed   Altered mental status   Elevated lactic acid level   Atrial fibrillation with rapid ventricular response ([facility])  Hemorrhagic shock in the setting of right renal hemorrhage Supratherapeutic INR status post Coumadin reversal agent Acute blood loss anemia Patient required coil embolization of right renal artery by vascular surgery.  Hemoglobin continues to drift but overall stable.  INR 1.5.  Shock resolved.  Neo-Synephrine discontinued.  Now on p.o. Eliquis and hemoglobin remained stable.  Continue to follow.  Ileus, resolved Patient had worsening of abdominal distention noted on 11/3.  KUB revealed gaseous distention consistent with ileus.  No signs of obstruction or free air under the diaphragm.  Patient has a relatively soft abdomen and good bowel sounds.  Multiple bowel movements reported.  Bowel regimen made as needed.  Continue therapy as tolerated.   Home with home health once clinically stabilized.  Atrial tachycardia/PNSVT/atrial fibrillation with rapid ventricular response Rate control improved.  On p.o. amiodarone and Eliquis.  ESRD on HD Nephrology following.  On hemodialysis Monday/Wednesday/Friday.  Heart failure with preserved ejection fraction LVEF 50 to 11%, grade 1 diastolic dysfunction.  Volume is managed by dialysis.  Started on [facility] 11/9.  Hold Imdur for now, can consider restarting if blood pressure tolerates  Heartburn: Patient is on p.o. Pepcid.  Complains of severe heartburn so we will change to twice daily Protonix   DVT prophylaxis: P.o. Eliquis Code Status: DNR Family Communication: Will call daughter Disposition Plan: Status is: Inpatient Remains inpatient appropriate because:  -Hemoglobin looks to be stabilized. -Complains of some mild shortness of breath noted oxygenation down to 90% on room air.  Continue to monitor.    Consultants:  Nephrology  Procedures:  Coil embolization  Antimicrobials:  Subjective: Postdialysis, complains of heartburn and fatigue  Objective: Vitals:   11/16/21 1118 11/16/21 1132 11/16/21 1154 11/16/21 1706  BP: 139/81  (!) 142/77 111/64  Pulse: 78  77 81  Resp: (!) '21  17 17  '$ Temp:   98.2 F (36.8 C) 99 F (37.2 C)  TempSrc:      SpO2: 100%  90% 92%  Weight:  63.6 kg    Height:        Intake/Output Summary (Last 24 hours) at 11/16/2021 1741 Last data filed at 11/16/2021 1401 Gross per 24 hour  Intake 240 ml  Output 500 ml  Net -260 ml    Filed Weights   11/14/21  1312 11/16/21 0829 11/16/21 1132  Weight: 69.1 kg 63.9 kg 63.6 kg    Examination: ` General exam: No acute distress.  Fatigued Respiratory system: Lungs clear.  Clear to auscultation bilaterally Cardiovascular system: Regular rate and rhythm, S1-S2 Gastrointestinal system: Soft, nondistended, nontender, hypoactive bowel sounds. Central nervous system: No focal deficits Extremities:  No clubbing or cyanosis, trace pitting edema Skin: No rashes, lesions or ulcers Psychiatry: Appropriate, no evidence of psychoses    Data Reviewed: I have personally reviewed following labs and imaging studies Hemoglobin stable at 7.7.  Renal function consistent with end-stage renal disease. CBC: Recent Labs  Lab 11/12/21 0452 11/13/21 0635 11/14/21 0633 11/15/21 0600 11/16/21 0547  WBC 7.2 7.0 6.9 6.8 7.2  NEUTROABS 4.9 4.8 4.5 4.6 4.9  HGB 8.8* 8.1* 7.9* 7.7* 7.7*  HCT 25.1* 23.0* 22.5* 21.6* 22.6*  MCV 79.4* 78.0* 79.5* 79.4* 80.7  PLT 186 193 213 230 703    Basic Metabolic Panel: Recent Labs  Lab 11/10/21 0440 11/11/21 0528 11/12/21 0452 11/13/21 0635 11/14/21 0633 11/15/21 0600 11/16/21 0547  NA 131*  --   --   --  135  --  135  K 3.7 3.4* 3.9  --  3.6  --  3.6  CL 91*  --   --   --  98  --  100  CO2 27  --   --   --  26  --  27  GLUCOSE 106*  --   --   --  83  --  86  BUN 40*  --   --   --  37*  --  37*  CREATININE 6.75*  --   --   --  7.13*  --  7.01*  CALCIUM 10.0  --   --   --  9.5  --  9.1  MG 2.2 1.9 2.0 1.9 1.9 1.7 2.0  PHOS 6.3* 4.3 4.8* 4.2 5.2*  5.3* 3.8 4.2  4.4    GFR: Estimated Creatinine Clearance: 7 mL/min (A) (by C-G formula based on SCr of 7.01 mg/dL (H)). Liver Function Tests: Recent Labs  Lab 11/14/21 5009 11/16/21 0547  ALBUMIN 1.9* 2.2*    No results for input(s): "LIPASE", "AMYLASE" in the last 168 hours. No results for input(s): "AMMONIA" in the last 168 hours. Coagulation Profile: Recent Labs  Lab 11/10/21 1625 11/13/21 1135  INR 1.3* 1.4*    Cardiac Enzymes: No results for input(s): "CKTOTAL", "CKMB", "CKMBINDEX", "TROPONINI" in the last 168 hours. BNP (last 3 results) No results for input(s): "PROBNP" in the last 8760 hours. HbA1C: No results for input(s): "HGBA1C" in the last 72 hours. CBG: Recent Labs  Lab 11/12/21 1958 11/13/21 0512 11/13/21 0811 11/13/21 1201 11/13/21 1646  GLUCAP 100* 100* 84 107*  104*    Lipid Profile: No results for input(s): "CHOL", "HDL", "LDLCALC", "TRIG", "CHOLHDL", "LDLDIRECT" in the last 72 hours. Thyroid Function Tests: No results for input(s): "TSH", "T4TOTAL", "FREET4", "T3FREE", "THYROIDAB" in the last 72 hours. Anemia Panel: No results for input(s): "VITAMINB12", "FOLATE", "FERRITIN", "TIBC", "IRON", "RETICCTPCT" in the last 72 hours. Sepsis Labs: Recent Labs  Lab 11/10/21 0817  PROCALCITON 3.96     No results found for this or any previous visit (from the past 240 hour(s)).        Radiology Studies: No results found.      Scheduled Meds:  amiodarone  200 mg Oral BID   apixaban  2.5 mg Oral BID   carvedilol  12.5 mg Oral  BID WC   Chlorhexidine Gluconate Cloth  6 each Topical Q0600   cinacalcet  60 mg Oral QAC lunch   famotidine  20 mg Oral Daily   feeding supplement (NEPRO CARB STEADY)  237 mL Oral TID BM   lidocaine  1 patch Transdermal Q24H   multivitamin  1 tablet Oral QHS   pantoprazole  40 mg Oral BID   polyethylene glycol  17 g Oral Daily   sacubitril-valsartan  1 tablet Oral BID   senna-docusate  1 tablet Oral BID   sertraline  100 mg Oral Daily   sevelamer carbonate  2,400 mg Oral TID with meals   traZODone  50 mg Oral QHS   zolpidem  5 mg Oral QHS   Continuous Infusions:  sodium chloride 250 mL (11/12/21 2214)   anticoagulant sodium citrate       LOS: 11 days      Annita Brod, MD [facility]   If 7PM-7AM, please contact night-coverage  11/16/2021, 5:41 PM

## 2021-11-16 NOTE — Progress Notes (Signed)
Pt completed tx without difficulty.  Removed 0.5L and processed 57.7L of blood.  B/p stable at 139/81 p 78 temp 98.4.  pt ran 2hrs 79mn. Lumens heparin locked and capped.  No signs of distress noted.  Report given to floor RN.

## 2021-11-17 ENCOUNTER — Inpatient Hospital Stay: Payer: Medicare Other

## 2021-11-17 DIAGNOSIS — R579 Shock, unspecified: Secondary | ICD-10-CM | POA: Diagnosis not present

## 2021-11-17 LAB — CBC WITH DIFFERENTIAL/PLATELET
Abs Immature Granulocytes: 0.12 10*3/uL — ABNORMAL HIGH (ref 0.00–0.07)
Basophils Absolute: 0 10*3/uL (ref 0.0–0.1)
Basophils Relative: 0 %
Eosinophils Absolute: 0.2 10*3/uL (ref 0.0–0.5)
Eosinophils Relative: 2 %
HCT: 22.9 % — ABNORMAL LOW (ref 36.0–46.0)
Hemoglobin: 7.8 g/dL — ABNORMAL LOW (ref 12.0–15.0)
Immature Granulocytes: 2 %
Lymphocytes Relative: 15 %
Lymphs Abs: 1.2 10*3/uL (ref 0.7–4.0)
MCH: 27.7 pg (ref 26.0–34.0)
MCHC: 34.1 g/dL (ref 30.0–36.0)
MCV: 81.2 fL (ref 80.0–100.0)
Monocytes Absolute: 0.8 10*3/uL (ref 0.1–1.0)
Monocytes Relative: 10 %
Neutro Abs: 5.8 10*3/uL (ref 1.7–7.7)
Neutrophils Relative %: 71 %
Platelets: 260 10*3/uL (ref 150–400)
RBC: 2.82 MIL/uL — ABNORMAL LOW (ref 3.87–5.11)
RDW: 16.9 % — ABNORMAL HIGH (ref 11.5–15.5)
WBC: 8.1 10*3/uL (ref 4.0–10.5)
nRBC: 0 % (ref 0.0–0.2)

## 2021-11-17 LAB — PHOSPHORUS: Phosphorus: 3.4 mg/dL (ref 2.5–4.6)

## 2021-11-17 LAB — MAGNESIUM: Magnesium: 1.8 mg/dL (ref 1.7–2.4)

## 2021-11-17 MED ORDER — EPOETIN ALFA 10000 UNIT/ML IJ SOLN
10000.0000 [IU] | INTRAMUSCULAR | Status: DC
  Administered 2021-11-19: 10000 [IU] via INTRAVENOUS

## 2021-11-17 NOTE — Plan of Care (Signed)
  Problem: Education: Goal: Knowledge of General Education information will improve Description: Including pain rating scale, medication(s)/side effects and non-pharmacologic comfort measures Outcome: Progressing   Problem: Health Behavior/Discharge Planning: Goal: Ability to manage health-related needs will improve Outcome: Progressing   Problem: Clinical Measurements: Goal: Ability to maintain clinical measurements within normal limits will improve Outcome: Progressing Goal: Will remain free from infection Outcome: Progressing Goal: Diagnostic test results will improve Outcome: Progressing Goal: Respiratory complications will improve Outcome: Progressing Goal: Cardiovascular complication will be avoided Outcome: Progressing   Problem: Activity: Goal: Risk for activity intolerance will decrease Outcome: Progressing   Problem: Nutrition: Goal: Adequate nutrition will be maintained Outcome: Progressing   Problem: Coping: Goal: Level of anxiety will decrease Outcome: Progressing   Problem: Pain Managment: Goal: General experience of comfort will improve Outcome: Progressing   Problem: Elimination: Goal: Will not experience complications related to bowel motility Outcome: Progressing Goal: Will not experience complications related to urinary retention Outcome: Progressing   Problem: Safety: Goal: Ability to remain free from injury will improve Outcome: Progressing   Problem: Pain Managment: Goal: General experience of comfort will improve Outcome: Progressing

## 2021-11-17 NOTE — Plan of Care (Signed)

## 2021-11-17 NOTE — Progress Notes (Signed)
PROGRESS NOTE    Teresa Cook  CBJ:[id number] DOB: [birth year] DOA: 11/05/2021 PCP: [facility]    Brief Narrative:  66 y.o female with PMH significant for ESRD on HD, A. Fib & DVT on Warfarin , admitted on 10/30 with Hemorrhagic shock, requiring pressor support initially, due to right renal hemorrhage with large hematoma in setting of supratherapeutic INR (INR >9).  Vascular Surgery consulted, required coil embolization of the right renal artery.  By 11/3, able to be weaned off of pressor support.  Transferred to the hospitalist service by 11/4.  Challenged with IV heparin on 11/7 and transitioned to p.o. Eliquis on 11/8.  Hemoglobin has since remained stable.   Assessment & Plan:   Principal Problem:   Shock ([place]) Active Problems:   Acute respiratory failure with hypoxia ([place])   ESRD on dialysis ([place])   End-stage renal disease on hemodialysis ([facility])   HTN (hypertension)   PAF (paroxysmal atrial fibrillation) ([facility])   Hypotension   Anemia due to acute blood loss   Right flank pain   Retroperitoneal bleed   Altered mental status   Elevated lactic acid level   Atrial fibrillation with rapid ventricular response ([facility])  Hemorrhagic shock in the setting of right renal hemorrhage Supratherapeutic INR status post Coumadin reversal agent Acute blood loss anemia Patient required coil embolization of right renal artery by vascular surgery.  Hemoglobin overall stable.  INR 1.5.  Shock resolved.  Neo-Synephrine discontinued.  Now on p.o. Eliquis and hemoglobin remained stable.  Continue to follow. -CT scan pending today with increased abdominal pain  Ileus, resolved Patient had worsening of abdominal distention noted on 11/3.  KUB revealed gaseous distention consistent with ileus.  No signs of obstruction or free air under the diaphragm.  Patient has a relatively soft abdomen and good bowel sounds.  Multiple bowel movements reported.  Bowel regimen made as needed.   Continue therapy as tolerated.  Home with home health once clinically stabilized.  Atrial tachycardia/PNSVT/atrial fibrillation with rapid ventricular response Rate control improved.  On p.o. amiodarone and Eliquis.  ESRD on HD Nephrology following.  On hemodialysis Monday/Wednesday/Friday.  Heart failure with preserved ejection fraction LVEF 50 to 16%, grade 1 diastolic dysfunction.  Volume is managed by dialysis.  Started on [facility] 11/9.  Hold Imdur for now, can consider restarting if blood pressure tolerates  GERD -PPI   DVT prophylaxis: P.o. Eliquis Code Status: DNR Disposition Plan: Status is: Inpatient Remains inpatient appropriate because:  CT scan pending    Consultants:  Nephrology  Procedures:  Coil embolization   Subjective: C/o abdominal pain-- mainly right sided Dd not sleep well last night  Objective: Vitals:   11/16/21 2022 11/17/21 0026 11/17/21 0504 11/17/21 0842  BP: 134/83 (!) 112/59 115/68 111/64  Pulse: 83 87 79 80  Resp: '20 20 18 17  '$ Temp: 98.9 F (37.2 C) 97.8 F (36.6 C) 98.5 F (36.9 C) 98.7 F (37.1 C)  TempSrc: Oral Oral  Oral  SpO2: 92% 95% 93% 96%  Weight:   64.4 kg   Height:        Intake/Output Summary (Last 24 hours) at 11/17/2021 1129 Last data filed at 11/17/2021 0945 Gross per 24 hour  Intake 420 ml  Output 0 ml  Net 420 ml   Filed Weights   11/16/21 0829 11/16/21 1132 11/17/21 0504  Weight: 63.9 kg 63.6 kg 64.4 kg    Examination: `  General: Appearance:    frail female in no acute distress  Lungs:     respirations unlabored  Heart:    Normal heart rate.   MS:   All extremities are intact.   Neurologic:   Awake, alert       Data Reviewed: I have personally reviewed following labs and imaging studies  CBC: Recent Labs  Lab 11/13/21 0635 11/14/21 0633 11/15/21 0600 11/16/21 0547 11/17/21 0455  WBC 7.0 6.9 6.8 7.2 8.1  NEUTROABS 4.8 4.5 4.6 4.9 5.8  HGB 8.1* 7.9* 7.7* 7.7* 7.8*  HCT 23.0*  22.5* 21.6* 22.6* 22.9*  MCV 78.0* 79.5* 79.4* 80.7 81.2  PLT 193 213 230 249 443   Basic Metabolic Panel: Recent Labs  Lab 11/11/21 0528 11/12/21 0452 11/13/21 0635 11/14/21 0633 11/15/21 0600 11/16/21 0547 11/17/21 0455  NA  --   --   --  135  --  135  --   K 3.4* 3.9  --  3.6  --  3.6  --   CL  --   --   --  98  --  100  --   CO2  --   --   --  26  --  27  --   GLUCOSE  --   --   --  83  --  86  --   BUN  --   --   --  37*  --  37*  --   CREATININE  --   --   --  7.13*  --  7.01*  --   CALCIUM  --   --   --  9.5  --  9.1  --   MG 1.9 2.0 1.9 1.9 1.7 2.0 1.8  PHOS 4.3 4.8* 4.2 5.2*  5.3* 3.8 4.2  4.4 3.4   GFR: Estimated Creatinine Clearance: 7 mL/min (A) (by C-G formula based on SCr of 7.01 mg/dL (H)). Liver Function Tests: Recent Labs  Lab 11/14/21 1540 11/16/21 0547  ALBUMIN 1.9* 2.2*   No results for input(s): "LIPASE", "AMYLASE" in the last 168 hours. No results for input(s): "AMMONIA" in the last 168 hours. Coagulation Profile: Recent Labs  Lab 11/10/21 1625 11/13/21 1135  INR 1.3* 1.4*   Cardiac Enzymes: No results for input(s): "CKTOTAL", "CKMB", "CKMBINDEX", "TROPONINI" in the last 168 hours. BNP (last 3 results) No results for input(s): "PROBNP" in the last 8760 hours. HbA1C: No results for input(s): "HGBA1C" in the last 72 hours. CBG: Recent Labs  Lab 11/12/21 1958 11/13/21 0512 11/13/21 0811 11/13/21 1201 11/13/21 1646  GLUCAP 100* 100* 84 107* 104*   Lipid Profile: No results for input(s): "CHOL", "HDL", "LDLCALC", "TRIG", "CHOLHDL", "LDLDIRECT" in the last 72 hours. Thyroid Function Tests: No results for input(s): "TSH", "T4TOTAL", "FREET4", "T3FREE", "THYROIDAB" in the last 72 hours. Anemia Panel: No results for input(s): "VITAMINB12", "FOLATE", "FERRITIN", "TIBC", "IRON", "RETICCTPCT" in the last 72 hours. Sepsis Labs: No results for input(s): "PROCALCITON", "LATICACIDVEN" in the last 168 hours.  No results found for this or any  previous visit (from the past 240 hour(s)).        Radiology Studies: No results found.      Scheduled Meds:  amiodarone  200 mg Oral BID   apixaban  2.5 mg Oral BID   carvedilol  12.5 mg Oral BID WC   Chlorhexidine Gluconate Cloth  6 each Topical Q0600   cinacalcet  60 mg Oral QAC lunch   famotidine  20 mg Oral Daily   feeding supplement (NEPRO CARB STEADY)  237 mL Oral TID BM  lidocaine  1 patch Transdermal Q24H   multivitamin  1 tablet Oral QHS   pantoprazole  40 mg Oral BID   polyethylene glycol  17 g Oral Daily   sacubitril-valsartan  1 tablet Oral BID   senna-docusate  1 tablet Oral BID   sertraline  100 mg Oral Daily   sevelamer carbonate  2,400 mg Oral TID with meals   traZODone  50 mg Oral QHS   zolpidem  5 mg Oral QHS   Continuous Infusions:  sodium chloride 250 mL (11/12/21 2214)   anticoagulant sodium citrate       LOS: 12 days      Geradine Girt, DO [facility]   If 7PM-7AM, please contact night-coverage  11/17/2021, 11:29 AM

## 2021-11-17 NOTE — Progress Notes (Addendum)
[facility]  ROUNDING NOTE   Subjective:   Teresa Cook is a 66 year old African-American female with past medical conditions including hypertension, gout, anxiety with depression, sickle cell trait, systolic heart failure with a EF 40 to 45%, breast cancer with bilateral mastectomy, seizures, and end-stage renal disease on hemodialysis.  Patient presents to the emergency department via EMS for multiple syncopal episodes.  Patient has been admitted for Right flank pain [R10.9] Elevated lactic acid level [R79.89] Hypotension [I95.9] Hypotension, unspecified hypotension type [I95.9] Altered mental status, unspecified altered mental status type [R41.82]  Patient is known to our practice and receives outpatient dialysis treatments at [facility] on a MWF schedule, supervised by Dr. Candiss Norse.    Patient seen laying in bed, ill appearing Complains of abdominal pain and shortness of breath States she is unable to speak for extended times without becoming "winded" She describes a tightness in her chest.  Last BM last evening  Objective:  Vital signs in last 24 hours:  Temp:  [97.8 F (36.6 C)-99 F (37.2 C)] 98.7 F (37.1 C) (11/11 0842) Pulse Rate:  [77-87] 80 (11/11 0842) Resp:  [17-20] 17 (11/11 0842) BP: (111-142)/(59-83) 111/64 (11/11 0842) SpO2:  [90 %-96 %] 96 % (11/11 0842) Weight:  [63.6 kg-64.4 kg] 64.4 kg (11/11 0504)  Weight change:  Filed Weights   11/16/21 0829 11/16/21 1132 11/17/21 0504  Weight: 63.9 kg 63.6 kg 64.4 kg    Intake/Output: I/O last 3 completed shifts: In: 420 [P.O.:420] Out: 500 [Other:500]   Intake/Output this shift:  No intake/output data recorded.  Physical Exam: General: NAD  Head: Normocephalic, atraumatic. Moist oral mucosal membranes  Eyes: Anicteric  Lungs:  Clear to auscultation, normal effort, room air  Heart: Regular rate and rhythm  Abdomen:  Soft, tender, BS present  Extremities:  No peripheral edema.   Neurologic: Nonfocal, moving all four extremities  Skin: No lesions  Access: Lt Permcath    Basic Metabolic Panel: Recent Labs  Lab 11/11/21 0528 11/12/21 0452 11/13/21 0635 11/14/21 0633 11/15/21 0600 11/16/21 0547 11/17/21 0455  NA  --   --   --  135  --  135  --   K 3.4* 3.9  --  3.6  --  3.6  --   CL  --   --   --  98  --  100  --   CO2  --   --   --  26  --  27  --   GLUCOSE  --   --   --  83  --  86  --   BUN  --   --   --  37*  --  37*  --   CREATININE  --   --   --  7.13*  --  7.01*  --   CALCIUM  --   --   --  9.5  --  9.1  --   MG 1.9 2.0 1.9 1.9 1.7 2.0 1.8  PHOS 4.3 4.8* 4.2 5.2*  5.3* 3.8 4.2  4.4 3.4     Liver Function Tests: Recent Labs  Lab 11/14/21 0633 11/16/21 0547  ALBUMIN 1.9* 2.2*    No results for input(s): "LIPASE", "AMYLASE" in the last 168 hours. No results for input(s): "AMMONIA" in the last 168 hours.  CBC: Recent Labs  Lab 11/13/21 0635 11/14/21 0633 11/15/21 0600 11/16/21 0547 11/17/21 0455  WBC 7.0 6.9 6.8 7.2 8.1  NEUTROABS 4.8 4.5 4.6 4.9 5.8  HGB  8.1* 7.9* 7.7* 7.7* 7.8*  HCT 23.0* 22.5* 21.6* 22.6* 22.9*  MCV 78.0* 79.5* 79.4* 80.7 81.2  PLT 193 213 230 249 260     Cardiac Enzymes: No results for input(s): "CKTOTAL", "CKMB", "CKMBINDEX", "TROPONINI" in the last 168 hours.  BNP: Invalid input(s): "POCBNP"  CBG: Recent Labs  Lab 11/12/21 1958 11/13/21 0512 11/13/21 0811 11/13/21 1201 11/13/21 1646  GLUCAP 100* 100* 32 107* 104*     Microbiology: Results for orders placed or performed during the hospital encounter of 11/05/21  Blood Culture (routine x 2)     Status: None   Collection Time: 11/05/21  3:20 AM   Specimen: BLOOD  Result Value Ref Range Status   Specimen Description BLOOD RIGHT FA  Final   Special Requests   Final    BOTTLES DRAWN AEROBIC AND ANAEROBIC Blood Culture results may not be optimal due to an inadequate volume of blood received in culture bottles   Culture   Final    NO GROWTH  5 DAYS Performed at [facility], [address] [place], [place] [address]    Report Status 11/10/2021 FINAL  Final  SARS Coronavirus 2 by RT PCR (hospital order, performed in [facility] hospital lab) *cepheid single result test* Anterior Nasal Swab     Status: None   Collection Time: 11/05/21  3:20 AM   Specimen: Anterior Nasal Swab  Result Value Ref Range Status   SARS Coronavirus 2 by RT PCR NEGATIVE NEGATIVE Final    Comment: (NOTE) SARS-CoV-2 target nucleic acids are NOT DETECTED.  The SARS-CoV-2 RNA is generally detectable in upper and lower respiratory specimens during the acute phase of infection. The lowest concentration of SARS-CoV-2 viral copies this assay can detect is 250 copies / mL. A negative result does not preclude SARS-CoV-2 infection and should not be used as the sole basis for treatment or other patient management decisions.  A negative result may occur with improper specimen collection / handling, submission of specimen other than nasopharyngeal swab, presence of viral mutation(s) within the areas targeted by this assay, and inadequate number of viral copies (<250 copies / mL). A negative result must be combined with clinical observations, patient history, and epidemiological information.  Fact Sheet for Patients:   https://www.patel.info/  Fact Sheet for Healthcare Providers: https://hall.com/  This test is not yet approved or  cleared by the [place] FDA and has been authorized for detection and/or diagnosis of SARS-CoV-2 by FDA under an Emergency Use Authorization (EUA).  This EUA will remain in effect (meaning this test can be used) for the duration of the COVID-19 declaration under Section 564(b)(1) of the Act, 21 U.S.C. section 360bbb-3(b)(1), unless the authorization is terminated or revoked sooner.  Performed at [facility], [address] [place], [place] [address]   Blood  Culture (routine x 2)     Status: None   Collection Time: 11/05/21  3:21 AM   Specimen: BLOOD  Result Value Ref Range Status   Specimen Description BLOOD RIGHT HAND  Final   Special Requests   Final    BOTTLES DRAWN AEROBIC AND ANAEROBIC Blood Culture results may not be optimal due to an inadequate volume of blood received in culture bottles   Culture   Final    NO GROWTH 5 DAYS Performed at [facility], [address], [place], [place] [address]    Report Status 11/10/2021 FINAL  Final  MRSA Next Gen by PCR, Nasal     Status:  None   Collection Time: 11/05/21  6:57 AM   Specimen: Nasal Mucosa; Nasal Swab  Result Value Ref Range Status   MRSA by PCR Next Gen NOT DETECTED NOT DETECTED Final    Comment: (NOTE) The GeneXpert MRSA Assay (FDA approved for NASAL specimens only), is one component of a comprehensive MRSA colonization surveillance program. It is not intended to diagnose MRSA infection nor to guide or monitor treatment for MRSA infections. Test performance is not FDA approved in patients less than 55 years old. Performed at [facility], [address] [place], [place] [address]     Coagulation Studies: No results for input(s): "LABPROT", "INR" in the last 72 hours.   Urinalysis: No results for input(s): "COLORURINE", "LABSPEC", "PHURINE", "GLUCOSEU", "HGBUR", "BILIRUBINUR", "KETONESUR", "PROTEINUR", "UROBILINOGEN", "NITRITE", "LEUKOCYTESUR" in the last 72 hours.  Invalid input(s): "APPERANCEUR"    Imaging: No results found.   Medications:    sodium chloride 250 mL (11/12/21 2214)   anticoagulant sodium citrate      amiodarone  200 mg Oral BID   apixaban  2.5 mg Oral BID   carvedilol  12.5 mg Oral BID WC   Chlorhexidine Gluconate Cloth  6 each Topical Q0600   cinacalcet  60 mg Oral QAC lunch   famotidine  20 mg Oral Daily   feeding supplement (NEPRO CARB STEADY)  237 mL Oral TID BM   lidocaine  1 patch Transdermal Q24H   multivitamin   1 tablet Oral QHS   pantoprazole  40 mg Oral BID   polyethylene glycol  17 g Oral Daily   sacubitril-valsartan  1 tablet Oral BID   senna-docusate  1 tablet Oral BID   sertraline  100 mg Oral Daily   sevelamer carbonate  2,400 mg Oral TID with meals   traZODone  50 mg Oral QHS   zolpidem  5 mg Oral QHS   acetaminophen, ALPRAZolam, alteplase, alum & mag hydroxide-simeth, anticoagulant sodium citrate, bisacodyl, fluticasone, heparin, HYDROmorphone (DILAUDID) injection, lidocaine (PF), lidocaine-prilocaine, loperamide, methocarbamol, metoprolol tartrate, ondansetron (ZOFRAN) IV, mouth rinse, pentafluoroprop-tetrafluoroeth, sodium chloride  Assessment/ Plan:  Ms. Zaydah Nawabi is a 66 y.o.  female is a 66 year old African-American female with past medical conditions including hypertension, gout, anxiety with depression, sickle cell trait, systolic heart failure with a EF 40 to 45%, breast cancer with bilateral mastectomy, seizures, and end-stage renal disease on hemodialysis.  Patient presents to the emergency department via EMS for multiple syncopal episodes.  Patient has been admitted for Right flank pain [R10.9] Elevated lactic acid level [R79.89] Hypotension [I95.9] Hypotension, unspecified hypotension type [I95.9] Altered mental status, unspecified altered mental status type [R41.82]  [facility]/MWF/Lt Permcath  End-stage renal disease on hemodialysis.  Will maintain outpatient schedule if possible. Next treatment scheduled for Monday.  2. Anemia of chronic kidney disease with acute blood loss Lab Results  Component Value Date   HGB 7.8 (L) 11/17/2021  CT angio abdomen pelvis shows severe hemorrhage at right kidney.  Appreciate vascular surgery performing right renal angiogram with coil embolization to manage hemorrhage.  Patient received 2 units FFP and blood transfusions thus far during admission. Hgb remains stable. Will order EPO with dialysis treatments. Will order CT  abd to evaluate abdomina pain.    3. Secondary Hyperparathyroidism: with outpatient labs: PTH 379, phosphorus 5.7, calcium 9.4 on June 18, 2021.   Lab Results  Component Value Date   PTH 214 (H) 07/08/2021   CALCIUM 9.1 11/16/2021   PHOS 3.4 11/17/2021   Continue  sevelamer with all meals.  4.  Hypotension likely due to blood loss, Currently weaned off pressors.  Resolved.  5.  Hyperkalemia, potassium corrected    LOS: 12 Vendetta Pittinger 11/11/202311:27 AM

## 2021-11-17 NOTE — Plan of Care (Signed)
Patient Teresa Cook,RA, ambulates independent with standby. C/O pain meds given x1 effective. Poor appetite. Continent B&B, anuric. Dialysis cath in place. Patient refused 3 renvela today says she takes 2 at home. Refused Entresto as well stating she needed to eat with it and was not hungry. Up to chair today for a short time.  Problem: Coping: Goal: Level of anxiety will decrease Outcome: Progressing   Problem: Elimination: Goal: Will not experience complications related to bowel motility Outcome: Progressing Goal: Will not experience complications related to urinary retention Outcome: Progressing   Problem: Pain Managment: Goal: General experience of comfort will improve Outcome: Progressing   Problem: Safety: Goal: Ability to remain free from injury will improve Outcome: Progressing   Problem: Skin Integrity: Goal: Risk for impaired skin integrity will decrease Outcome: Progressing   Problem: Education: Goal: Knowledge of General Education information will improve Description: Including pain rating scale, medication(s)/side effects and non-pharmacologic comfort measures Outcome: Progressing   Problem: Health Behavior/Discharge Planning: Goal: Ability to manage health-related needs will improve Outcome: Progressing   Problem: Clinical Measurements: Goal: Ability to maintain clinical measurements within normal limits will improve Outcome: Progressing Goal: Will remain free from infection Outcome: Progressing Goal: Diagnostic test results will improve Outcome: Progressing Goal: Respiratory complications will improve Outcome: Progressing Goal: Cardiovascular complication will be avoided Outcome: Progressing   Problem: Activity: Goal: Risk for activity intolerance will decrease Outcome: Progressing   Problem: Nutrition: Goal: Adequate nutrition will be maintained Outcome: Progressing   Problem: Coping: Goal: Level of anxiety will decrease Outcome: Progressing

## 2021-11-18 DIAGNOSIS — R579 Shock, unspecified: Secondary | ICD-10-CM | POA: Diagnosis not present

## 2021-11-18 LAB — CBC WITH DIFFERENTIAL/PLATELET
Abs Immature Granulocytes: 0.07 10*3/uL (ref 0.00–0.07)
Basophils Absolute: 0 10*3/uL (ref 0.0–0.1)
Basophils Relative: 0 %
Eosinophils Absolute: 0.1 10*3/uL (ref 0.0–0.5)
Eosinophils Relative: 2 %
HCT: 23.1 % — ABNORMAL LOW (ref 36.0–46.0)
Hemoglobin: 7.8 g/dL — ABNORMAL LOW (ref 12.0–15.0)
Immature Granulocytes: 1 %
Lymphocytes Relative: 14 %
Lymphs Abs: 1.1 10*3/uL (ref 0.7–4.0)
MCH: 27.7 pg (ref 26.0–34.0)
MCHC: 33.8 g/dL (ref 30.0–36.0)
MCV: 81.9 fL (ref 80.0–100.0)
Monocytes Absolute: 0.7 10*3/uL (ref 0.1–1.0)
Monocytes Relative: 9 %
Neutro Abs: 5.5 10*3/uL (ref 1.7–7.7)
Neutrophils Relative %: 74 %
Platelets: 268 10*3/uL (ref 150–400)
RBC: 2.82 MIL/uL — ABNORMAL LOW (ref 3.87–5.11)
RDW: 16.8 % — ABNORMAL HIGH (ref 11.5–15.5)
WBC: 7.5 10*3/uL (ref 4.0–10.5)
nRBC: 0 % (ref 0.0–0.2)

## 2021-11-18 LAB — MAGNESIUM: Magnesium: 1.9 mg/dL (ref 1.7–2.4)

## 2021-11-18 LAB — PHOSPHORUS: Phosphorus: 4 mg/dL (ref 2.5–4.6)

## 2021-11-18 MED ORDER — OXYCODONE HCL 5 MG PO TABS
5.0000 mg | ORAL_TABLET | Freq: Four times a day (QID) | ORAL | Status: DC | PRN
  Administered 2021-11-18 – 2021-11-20 (×4): 5 mg via ORAL
  Filled 2021-11-18 (×4): qty 1

## 2021-11-18 MED ORDER — LIDOCAINE 5 % EX PTCH
2.0000 | MEDICATED_PATCH | CUTANEOUS | Status: DC
  Administered 2021-11-18 – 2021-11-22 (×4): 2 via TRANSDERMAL
  Filled 2021-11-18 (×5): qty 2

## 2021-11-18 NOTE — Progress Notes (Signed)
[facility]  ROUNDING NOTE   Subjective:   Teresa Cook is a 66 year old African-American female with past medical conditions including hypertension, gout, anxiety with depression, sickle cell trait, systolic heart failure with a EF 40 to 45%, breast cancer with bilateral mastectomy, seizures, and end-stage renal disease on hemodialysis.  Patient presents to the emergency department via EMS for multiple syncopal episodes.  Patient has been admitted for Right flank pain [R10.9] Elevated lactic acid level [R79.89] Hypotension [I95.9] Hypotension, unspecified hypotension type [I95.9] Altered mental status, unspecified altered mental status type [R41.82]  Patient is known to our practice and receives outpatient dialysis treatments at [facility] on a MWF schedule, supervised by Dr. Candiss Norse.    Ill appearing Continues to complain of abdominal pain and shortness of breath. Last BM this morning, normal  Objective:  Vital signs in last 24 hours:  Temp:  [98 F (36.7 C)-99.4 F (37.4 C)] 98.5 F (36.9 C) (11/12 0741) Pulse Rate:  [74-87] 79 (11/12 0741) Resp:  [13-18] 13 (11/12 0741) BP: (112-122)/(62-99) 122/66 (11/12 0741) SpO2:  [90 %-95 %] 94 % (11/12 0741) Weight:  [64.5 kg] 64.5 kg (11/12 0500)  Weight change: 0.6 kg Filed Weights   11/16/21 1132 11/17/21 0504 11/18/21 0500  Weight: 63.6 kg 64.4 kg 64.5 kg    Intake/Output: I/O last 3 completed shifts: In: 240 [P.O.:240] Out: 0    Intake/Output this shift:  No intake/output data recorded.  Physical Exam: General: NAD, ill appearing  Head: Normocephalic, atraumatic. Moist oral mucosal membranes  Eyes: Anicteric  Lungs:  Clear to auscultation, normal effort, room air  Heart: Regular rate and rhythm  Abdomen:  Soft, tender, BS present  Extremities:  No peripheral edema.  Neurologic: Nonfocal, moving all four extremities  Skin: No lesions  Access: Lt Permcath    Basic Metabolic Panel: Recent  Labs  Lab 11/12/21 0452 11/13/21 0635 11/14/21 0633 11/15/21 0600 11/16/21 0547 11/17/21 0455 11/18/21 0445  NA  --   --  135  --  135  --   --   K 3.9  --  3.6  --  3.6  --   --   CL  --   --  98  --  100  --   --   CO2  --   --  26  --  27  --   --   GLUCOSE  --   --  83  --  86  --   --   BUN  --   --  37*  --  37*  --   --   CREATININE  --   --  7.13*  --  7.01*  --   --   CALCIUM  --   --  9.5  --  9.1  --   --   MG 2.0   < > 1.9 1.7 2.0 1.8 1.9  PHOS 4.8*   < > 5.2*  5.3* 3.8 4.2  4.4 3.4 4.0   < > = values in this interval not displayed.     Liver Function Tests: Recent Labs  Lab 11/14/21 5465 11/16/21 0547  ALBUMIN 1.9* 2.2*    No results for input(s): "LIPASE", "AMYLASE" in the last 168 hours. No results for input(s): "AMMONIA" in the last 168 hours.  CBC: Recent Labs  Lab 11/14/21 0633 11/15/21 0600 11/16/21 0547 11/17/21 0455 11/18/21 0445  WBC 6.9 6.8 7.2 8.1 7.5  NEUTROABS 4.5 4.6 4.9 5.8 5.5  HGB 7.9*  7.7* 7.7* 7.8* 7.8*  HCT 22.5* 21.6* 22.6* 22.9* 23.1*  MCV 79.5* 79.4* 80.7 81.2 81.9  PLT 213 230 249 260 268     Cardiac Enzymes: No results for input(s): "CKTOTAL", "CKMB", "CKMBINDEX", "TROPONINI" in the last 168 hours.  BNP: Invalid input(s): "POCBNP"  CBG: Recent Labs  Lab 11/12/21 1958 11/13/21 0512 11/13/21 0811 11/13/21 1201 11/13/21 1646  GLUCAP 100* 100* 50 107* 104*     Microbiology: Results for orders placed or performed during the hospital encounter of 11/05/21  Blood Culture (routine x 2)     Status: None   Collection Time: 11/05/21  3:20 AM   Specimen: BLOOD  Result Value Ref Range Status   Specimen Description BLOOD RIGHT FA  Final   Special Requests   Final    BOTTLES DRAWN AEROBIC AND ANAEROBIC Blood Culture results may not be optimal due to an inadequate volume of blood received in culture bottles   Culture   Final    NO GROWTH 5 DAYS Performed at [facility], [address] [place],  [place] [address]    Report Status 11/10/2021 FINAL  Final  SARS Coronavirus 2 by RT PCR (hospital order, performed in [facility] hospital lab) *cepheid single result test* Anterior Nasal Swab     Status: None   Collection Time: 11/05/21  3:20 AM   Specimen: Anterior Nasal Swab  Result Value Ref Range Status   SARS Coronavirus 2 by RT PCR NEGATIVE NEGATIVE Final    Comment: (NOTE) SARS-CoV-2 target nucleic acids are NOT DETECTED.  The SARS-CoV-2 RNA is generally detectable in upper and lower respiratory specimens during the acute phase of infection. The lowest concentration of SARS-CoV-2 viral copies this assay can detect is 250 copies / mL. A negative result does not preclude SARS-CoV-2 infection and should not be used as the sole basis for treatment or other patient management decisions.  A negative result may occur with improper specimen collection / handling, submission of specimen other than nasopharyngeal swab, presence of viral mutation(s) within the areas targeted by this assay, and inadequate number of viral copies (<250 copies / mL). A negative result must be combined with clinical observations, patient history, and epidemiological information.  Fact Sheet for Patients:   https://www.patel.info/  Fact Sheet for Healthcare Providers: https://hall.com/  This test is not yet approved or  cleared by the [place] FDA and has been authorized for detection and/or diagnosis of SARS-CoV-2 by FDA under an Emergency Use Authorization (EUA).  This EUA will remain in effect (meaning this test can be used) for the duration of the COVID-19 declaration under Section 564(b)(1) of the Act, 21 U.S.C. section 360bbb-3(b)(1), unless the authorization is terminated or revoked sooner.  Performed at [facility], [address] [place], [place] [address]   Blood Culture (routine x 2)     Status: None   Collection Time: 11/05/21  3:21 AM    Specimen: BLOOD  Result Value Ref Range Status   Specimen Description BLOOD RIGHT HAND  Final   Special Requests   Final    BOTTLES DRAWN AEROBIC AND ANAEROBIC Blood Culture results may not be optimal due to an inadequate volume of blood received in culture bottles   Culture   Final    NO GROWTH 5 DAYS Performed at [facility], [address], [place],  [address]    Report Status 11/10/2021 FINAL  Final  MRSA Next Gen by PCR, Nasal     Status: None  Collection Time: 11/05/21  6:57 AM   Specimen: Nasal Mucosa; Nasal Swab  Result Value Ref Range Status   MRSA by PCR Next Gen NOT DETECTED NOT DETECTED Final    Comment: (NOTE) The GeneXpert MRSA Assay (FDA approved for NASAL specimens only), is one component of a comprehensive MRSA colonization surveillance program. It is not intended to diagnose MRSA infection nor to guide or monitor treatment for MRSA infections. Test performance is not FDA approved in patients less than 39 years old. Performed at [facility], [address] [place],  [address]     Coagulation Studies: No results for input(s): "LABPROT", "INR" in the last 72 hours.   Urinalysis: No results for input(s): "COLORURINE", "LABSPEC", "PHURINE", "GLUCOSEU", "HGBUR", "BILIRUBINUR", "KETONESUR", "PROTEINUR", "UROBILINOGEN", "NITRITE", "LEUKOCYTESUR" in the last 72 hours.  Invalid input(s): "APPERANCEUR"    Imaging: CT ABDOMEN PELVIS WO CONTRAST  Result Date: 11/17/2021 CLINICAL DATA:  Abdominal pain.  Right retroperitoneal hematoma. EXAM: CT ABDOMEN AND PELVIS WITHOUT CONTRAST TECHNIQUE: Multidetector CT imaging of the abdomen and pelvis was performed following the standard protocol without IV contrast. RADIATION DOSE REDUCTION: This exam was performed according to the departmental dose-optimization program which includes automated exposure control, adjustment of the mA and/or kV according to patient size and/or use of iterative  reconstruction technique. COMPARISON:  11/06/2021 FINDINGS: Lower chest: Small bilateral pleural effusions with overlying compressive type atelectasis. Hepatobiliary: No focal liver abnormality is seen. No gallstones, gallbladder wall thickening, or biliary dilatation. Vicarious excretion of contrast material into the gallbladder is noted. Pancreas: Unremarkable. No pancreatic ductal dilatation or surrounding inflammatory changes. Spleen: Normal in size without focal abnormality. Adrenals/Urinary Tract: Normal adrenal glands. Bilateral atrophic in stage cystic kidneys are identified. The right kidney subcapsular hematoma measures 6.4 x 3.1 by 10.0 cm (volume = 100 cm^3). On the previous exam 6.9 x 3.5 by 10.6 cm (volume = 130 cm^3). Again seen are signs of right renal artery coil embolization. Urinary bladder appears decompressed. Stomach/Bowel: Stomach appears normal. No bowel wall thickening, inflammation, or distension. Vascular/Lymphatic: Extensive aortic atherosclerotic disease. No signs of aneurysm. No abdominopelvic adenopathy. Reproductive: Status post hysterectomy. No adnexal masses. Other: Large right retroperitoneal hematoma is again noted. This is decreased in density when compared with the previous exam compatible with subacute hematoma. On today's study this measures 14.7 x 5.1 by 25.2 cm, image 88/6 and image 48/2. On the previous exam this measured 14.7 x 4.6 by 26.2 cm. Decrease in volume and density hemoperitoneum within the dependent portion of the pelvis. Musculoskeletal: No acute or suspicious osseous findings. Stigmata of renal osteodystrophy is again noted. IMPRESSION: 1. Similar size of large right retroperitoneal hematoma which is decreased in density when compared with 11/05/2021 compatible with subacute hematoma. 2. Subcapsular hematoma overlying the right kidney is decreased in size from prior exam. 3. Resolving hemoperitoneum is decreased in volume and density compared with the previous  exam. 4. Similar, small bilateral pleural effusions with overlying compressive type atelectasis. 5.  Aortic Atherosclerosis (ICD10-I70.0). Electronically Signed   By: Kerby Moors M.D.   On: 11/17/2021 18:31     Medications:    sodium chloride 250 mL (11/12/21 2214)   anticoagulant sodium citrate      amiodarone  200 mg Oral BID   apixaban  2.5 mg Oral BID   carvedilol  12.5 mg Oral BID WC   Chlorhexidine Gluconate Cloth  6 each Topical Q0600   cinacalcet  60 mg Oral QAC lunch   [START ON 11/19/2021] epoetin (EPOGEN/PROCRIT)  injection  10,000 Units Intravenous Q M,W,F-HD   famotidine  20 mg Oral Daily   feeding supplement (NEPRO CARB STEADY)  237 mL Oral TID BM   lidocaine  2 patch Transdermal Q24H   multivitamin  1 tablet Oral QHS   pantoprazole  40 mg Oral BID   polyethylene glycol  17 g Oral Daily   sacubitril-valsartan  1 tablet Oral BID   senna-docusate  1 tablet Oral BID   sertraline  100 mg Oral Daily   sevelamer carbonate  2,400 mg Oral TID with meals   traZODone  50 mg Oral QHS   zolpidem  5 mg Oral QHS   acetaminophen, ALPRAZolam, alteplase, alum & mag hydroxide-simeth, anticoagulant sodium citrate, bisacodyl, fluticasone, heparin, HYDROmorphone (DILAUDID) injection, lidocaine (PF), lidocaine-prilocaine, loperamide, methocarbamol, metoprolol tartrate, ondansetron (ZOFRAN) IV, mouth rinse, oxyCODONE, pentafluoroprop-tetrafluoroeth, sodium chloride  Assessment/ Plan:  [name] is a 66 y.o.  female is a 66 year old African-American female with past medical conditions including hypertension, gout, anxiety with depression, sickle cell trait, systolic heart failure with a EF 40 to 45%, breast cancer with bilateral mastectomy, seizures, and end-stage renal disease on hemodialysis.  Patient presents to the emergency department via EMS for multiple syncopal episodes.  Patient has been admitted for Right flank pain [R10.9] Elevated lactic acid level [R79.89] Hypotension  [I95.9] Hypotension, unspecified hypotension type [I95.9] Altered mental status, unspecified altered mental status type [R41.82]  [facility]/MWF/Lt Permcath  End-stage renal disease on hemodialysis.  Will maintain outpatient schedule if possible. Will plan for dialysis on Monday.  2. Anemia of chronic kidney disease with acute blood loss Lab Results  Component Value Date   HGB 7.8 (L) 11/18/2021  CT angio abdomen pelvis shows severe hemorrhage at right kidney.  Appreciate vascular surgery performing right renal angiogram with coil embolization to manage hemorrhage.  Patient received 2 units FFP and blood transfusions thus far during admission. Hgb decreased but stable.  EPO ordered with dialysis treatments. CT abd pelvis stable, renal hematomas improving   3. Secondary Hyperparathyroidism: with outpatient labs: PTH 379, phosphorus 5.7, calcium 9.4 on June 18, 2021.   Lab Results  Component Value Date   PTH 214 (H) 07/08/2021   CALCIUM 9.1 11/16/2021   PHOS 4.0 11/18/2021  Will continue to monitor bone mineral during this admission. Continue sevelamer with all meals.  4.  Hypotension likely due to blood loss, resolved  5.  Hyperkalemia, Corrected with dialysis   LOS: 13 Teresa Cook 11/12/20239:17 AM

## 2021-11-18 NOTE — Progress Notes (Signed)
PROGRESS NOTE    Teresa Cook  CXK:[id number] DOB: 1955-09-10 DOA: 11/05/2021 PCP: [facility] [place]    Brief Narrative:  66 y.o female with PMH significant for ESRD on HD, A. Fib & DVT on Warfarin , admitted on 10/30 with Hemorrhagic shock, requiring pressor support initially, due to right renal hemorrhage with large hematoma in setting of supratherapeutic INR (INR >9).  Vascular Surgery consulted, required coil embolization of the right renal artery.  By 11/3, able to be weaned off of pressor support.  Transferred to the hospitalist service by 11/4.  Challenged with IV heparin on 11/7 and transitioned to p.o. Eliquis on 11/8.  Hemoglobin has since remained stable.  Pain continues to be an issue.  CT scan done 11/11 withOUT worsening of hematomas.     Assessment & Plan:   Principal Problem:   Shock ([place]) Active Problems:   Acute respiratory failure with hypoxia ([place])   ESRD on dialysis ([place])   End-stage renal disease on hemodialysis (H[facility])   HTN (hypertension)   PAF (paroxysmal atrial fibrillation) (H[facility])   Hypotension   Anemia due to acute blood loss   Right flank pain   Retroperitoneal bleed   Altered mental status   Elevated lactic acid level   Atrial fibrillation with rapid ventricular response (H[facility])   Hemorrhagic shock in the setting of right renal hemorrhage Supratherapeutic INR status post Coumadin reversal agent Acute blood loss anemia Patient required coil embolization of right renal artery by vascular surgery.  Hemoglobin overall stable.  INR 1.5.  Shock resolved.  Neo-Synephrine discontinued.  Now on p.o. Eliquis and hemoglobin remained stable.  Continue to follow. -CT scan shows stability of hematomas -patient still having a lot of pain  Ileus, resolved Patient had worsening of abdominal distention noted on 11/3.  KUB revealed gaseous distention consistent with ileus.  No signs of obstruction or free air under the diaphragm.   +BMs  Atrial  tachycardia/PNSVT/atrial fibrillation with rapid ventricular response Rate control improved.  On p.o. amiodarone and Eliquis.  ESRD on HD Nephrology following.  On hemodialysis Monday/Wednesday/Friday.  Heart failure with preserved ejection fraction LVEF 50 to 97%, grade 1 diastolic dysfunction.  Volume is managed by dialysis.  Started on [facility] 11/9.  Hold Imdur for now, can consider restarting if blood pressure tolerates  GERD -PPI   DVT prophylaxis: P.o. Eliquis Code Status: DNR Disposition Plan: Status is: Inpatient Remains inpatient appropriate because: pain control prior to d/c   Consultants:  Nephrology  Procedures:  Coil embolization   Subjective: Still with pain on right side keeping her from being able to sleep/ambulate well  Objective: Vitals:   11/17/21 1928 11/18/21 0500 11/18/21 0521 11/18/21 0741  BP: 121/70  117/62 122/66  Pulse: 74  79 79  Resp: '18  18 13  '$ Temp: 98.5 F (36.9 C)  98 F (36.7 C) 98.5 F (36.9 C)  TempSrc: Oral  Oral Oral  SpO2: 94%  90% 94%  Weight:  64.5 kg    Height:        Intake/Output Summary (Last 24 hours) at 11/18/2021 1020 Last data filed at 11/18/2021 1014 Gross per 24 hour  Intake 480 ml  Output --  Net 480 ml   Filed Weights   11/16/21 1132 11/17/21 0504 11/18/21 0500  Weight: 63.6 kg 64.4 kg 64.5 kg    Examination:  General: Appearance:    Older than stated age female who appears uncomfortable  abd Tender to touch in RLQ  Lungs:  Clear to auscultation bilaterally, respirations unlabored  Heart:    Normal heart rate.  MS:   All extremities are intact.   Neurologic:   Awake, alert    Data Reviewed: I have personally reviewed following labs and imaging studies  CBC: Recent Labs  Lab 11/14/21 0633 11/15/21 0600 11/16/21 0547 11/17/21 0455 11/18/21 0445  WBC 6.9 6.8 7.2 8.1 7.5  NEUTROABS 4.5 4.6 4.9 5.8 5.5  HGB 7.9* 7.7* 7.7* 7.8* 7.8*  HCT 22.5* 21.6* 22.6* 22.9* 23.1*  MCV 79.5* 79.4*  80.7 81.2 81.9  PLT 213 230 249 260 734   Basic Metabolic Panel: Recent Labs  Lab 11/12/21 0452 11/13/21 0635 11/14/21 0633 11/15/21 0600 11/16/21 0547 11/17/21 0455 11/18/21 0445  NA  --   --  135  --  135  --   --   K 3.9  --  3.6  --  3.6  --   --   CL  --   --  98  --  100  --   --   CO2  --   --  26  --  27  --   --   GLUCOSE  --   --  83  --  86  --   --   BUN  --   --  37*  --  37*  --   --   CREATININE  --   --  7.13*  --  7.01*  --   --   CALCIUM  --   --  9.5  --  9.1  --   --   MG 2.0   < > 1.9 1.7 2.0 1.8 1.9  PHOS 4.8*   < > 5.2*  5.3* 3.8 4.2  4.4 3.4 4.0   < > = values in this interval not displayed.   GFR: Estimated Creatinine Clearance: 7 mL/min (A) (by C-G formula based on SCr of 7.01 mg/dL (H)). Liver Function Tests: Recent Labs  Lab 11/14/21 1937 11/16/21 0547  ALBUMIN 1.9* 2.2*   No results for input(s): "LIPASE", "AMYLASE" in the last 168 hours. No results for input(s): "AMMONIA" in the last 168 hours. Coagulation Profile: Recent Labs  Lab 11/13/21 1135  INR 1.4*   Cardiac Enzymes: No results for input(s): "CKTOTAL", "CKMB", "CKMBINDEX", "TROPONINI" in the last 168 hours. BNP (last 3 results) No results for input(s): "PROBNP" in the last 8760 hours. HbA1C: No results for input(s): "HGBA1C" in the last 72 hours. CBG: Recent Labs  Lab 11/12/21 1958 11/13/21 0512 11/13/21 0811 11/13/21 1201 11/13/21 1646  GLUCAP 100* 100* 84 107* 104*    Radiology Studies: CT ABDOMEN PELVIS WO CONTRAST  Result Date: 11/17/2021 CLINICAL DATA:  Abdominal pain.  Right retroperitoneal hematoma. EXAM: CT ABDOMEN AND PELVIS WITHOUT CONTRAST TECHNIQUE: Multidetector CT imaging of the abdomen and pelvis was performed following the standard protocol without IV contrast. RADIATION DOSE REDUCTION: This exam was performed according to the departmental dose-optimization program which includes automated exposure control, adjustment of the mA and/or kV according to  patient size and/or use of iterative reconstruction technique. COMPARISON:  11/06/2021 FINDINGS: Lower chest: Small bilateral pleural effusions with overlying compressive type atelectasis. Hepatobiliary: No focal liver abnormality is seen. No gallstones, gallbladder wall thickening, or biliary dilatation. Vicarious excretion of contrast material into the gallbladder is noted. Pancreas: Unremarkable. No pancreatic ductal dilatation or surrounding inflammatory changes. Spleen: Normal in size without focal abnormality. Adrenals/Urinary Tract: Normal adrenal glands. Bilateral atrophic in stage cystic kidneys are identified. The  right kidney subcapsular hematoma measures 6.4 x 3.1 by 10.0 cm (volume = 100 cm^3). On the previous exam 6.9 x 3.5 by 10.6 cm (volume = 130 cm^3). Again seen are signs of right renal artery coil embolization. Urinary bladder appears decompressed. Stomach/Bowel: Stomach appears normal. No bowel wall thickening, inflammation, or distension. Vascular/Lymphatic: Extensive aortic atherosclerotic disease. No signs of aneurysm. No abdominopelvic adenopathy. Reproductive: Status post hysterectomy. No adnexal masses. Other: Large right retroperitoneal hematoma is again noted. This is decreased in density when compared with the previous exam compatible with subacute hematoma. On today's study this measures 14.7 x 5.1 by 25.2 cm, image 88/6 and image 48/2. On the previous exam this measured 14.7 x 4.6 by 26.2 cm. Decrease in volume and density hemoperitoneum within the dependent portion of the pelvis. Musculoskeletal: No acute or suspicious osseous findings. Stigmata of renal osteodystrophy is again noted. IMPRESSION: 1. Similar size of large right retroperitoneal hematoma which is decreased in density when compared with 11/05/2021 compatible with subacute hematoma. 2. Subcapsular hematoma overlying the right kidney is decreased in size from prior exam. 3. Resolving hemoperitoneum is decreased in volume  and density compared with the previous exam. 4. Similar, small bilateral pleural effusions with overlying compressive type atelectasis. 5.  Aortic Atherosclerosis (ICD10-I70.0). Electronically Signed   By: Kerby Moors M.D.   On: 11/17/2021 18:31        Scheduled Meds:  amiodarone  200 mg Oral BID   apixaban  2.5 mg Oral BID   carvedilol  12.5 mg Oral BID WC   Chlorhexidine Gluconate Cloth  6 each Topical Q0600   cinacalcet  60 mg Oral QAC lunch   [START ON 11/19/2021] epoetin (EPOGEN/PROCRIT) injection  10,000 Units Intravenous Q M,W,F-HD   famotidine  20 mg Oral Daily   feeding supplement (NEPRO CARB STEADY)  237 mL Oral TID BM   lidocaine  2 patch Transdermal Q24H   multivitamin  1 tablet Oral QHS   pantoprazole  40 mg Oral BID   polyethylene glycol  17 g Oral Daily   sacubitril-valsartan  1 tablet Oral BID   senna-docusate  1 tablet Oral BID   sertraline  100 mg Oral Daily   sevelamer carbonate  2,400 mg Oral TID with meals   traZODone  50 mg Oral QHS   zolpidem  5 mg Oral QHS   Continuous Infusions:  sodium chloride 250 mL (11/12/21 2214)   anticoagulant sodium citrate       LOS: 13 days      Geradine Girt, DO [facility]   If 7PM-7AM, please contact night-coverage  11/18/2021, 10:20 AM

## 2021-11-18 NOTE — Plan of Care (Signed)
Patient resting well. Not in any form of distress. Tolerated due medicines well. Able to make needs known. Needs attended.   Problem: Education: Goal: Knowledge of General Education information will improve Description: Including pain rating scale, medication(s)/side effects and non-pharmacologic comfort measures Outcome: Progressing   Problem: Health Behavior/Discharge Planning: Goal: Ability to manage health-related needs will improve Outcome: Progressing   Problem: Clinical Measurements: Goal: Ability to maintain clinical measurements within normal limits will improve Outcome: Progressing Goal: Will remain free from infection Outcome: Progressing Goal: Diagnostic test results will improve Outcome: Progressing

## 2021-11-19 DIAGNOSIS — R58 Hemorrhage, not elsewhere classified: Secondary | ICD-10-CM | POA: Diagnosis not present

## 2021-11-19 DIAGNOSIS — I4891 Unspecified atrial fibrillation: Secondary | ICD-10-CM | POA: Diagnosis not present

## 2021-11-19 DIAGNOSIS — N186 End stage renal disease: Secondary | ICD-10-CM | POA: Diagnosis not present

## 2021-11-19 DIAGNOSIS — R579 Shock, unspecified: Secondary | ICD-10-CM | POA: Diagnosis not present

## 2021-11-19 LAB — CBC WITH DIFFERENTIAL/PLATELET
Abs Immature Granulocytes: 0.08 10*3/uL — ABNORMAL HIGH (ref 0.00–0.07)
Basophils Absolute: 0 10*3/uL (ref 0.0–0.1)
Basophils Relative: 0 %
Eosinophils Absolute: 0.2 10*3/uL (ref 0.0–0.5)
Eosinophils Relative: 2 %
HCT: 25.8 % — ABNORMAL LOW (ref 36.0–46.0)
Hemoglobin: 8.8 g/dL — ABNORMAL LOW (ref 12.0–15.0)
Immature Granulocytes: 1 %
Lymphocytes Relative: 15 %
Lymphs Abs: 1.3 10*3/uL (ref 0.7–4.0)
MCH: 27.7 pg (ref 26.0–34.0)
MCHC: 34.1 g/dL (ref 30.0–36.0)
MCV: 81.1 fL (ref 80.0–100.0)
Monocytes Absolute: 0.8 10*3/uL (ref 0.1–1.0)
Monocytes Relative: 9 %
Neutro Abs: 6.2 10*3/uL (ref 1.7–7.7)
Neutrophils Relative %: 73 %
Platelets: 326 10*3/uL (ref 150–400)
RBC: 3.18 MIL/uL — ABNORMAL LOW (ref 3.87–5.11)
RDW: 16.8 % — ABNORMAL HIGH (ref 11.5–15.5)
WBC: 8.5 10*3/uL (ref 4.0–10.5)
nRBC: 0 % (ref 0.0–0.2)

## 2021-11-19 LAB — RENAL FUNCTION PANEL
Albumin: 2.5 g/dL — ABNORMAL LOW (ref 3.5–5.0)
Anion gap: 10 (ref 5–15)
BUN: 44 mg/dL — ABNORMAL HIGH (ref 8–23)
CO2: 28 mmol/L (ref 22–32)
Calcium: 9.7 mg/dL (ref 8.9–10.3)
Chloride: 98 mmol/L (ref 98–111)
Creatinine, Ser: 8.59 mg/dL — ABNORMAL HIGH (ref 0.44–1.00)
GFR, Estimated: 5 mL/min — ABNORMAL LOW (ref >60)
Glucose, Bld: 83 mg/dL (ref 70–99)
Phosphorus: 4.5 mg/dL (ref 2.5–4.6)
Potassium: 4.4 mmol/L (ref 3.5–5.1)
Sodium: 136 mmol/L (ref 135–145)

## 2021-11-19 LAB — PHOSPHORUS: Phosphorus: 4.5 mg/dL (ref 2.5–4.6)

## 2021-11-19 LAB — MAGNESIUM: Magnesium: 2 mg/dL (ref 1.7–2.4)

## 2021-11-19 MED ORDER — HEPARIN SODIUM (PORCINE) 1000 UNIT/ML IJ SOLN
INTRAMUSCULAR | Status: AC
  Filled 2021-11-19: qty 10

## 2021-11-19 MED ORDER — EPOETIN ALFA 10000 UNIT/ML IJ SOLN
INTRAMUSCULAR | Status: AC
  Filled 2021-11-19: qty 1

## 2021-11-19 MED ORDER — OXYCODONE HCL 5 MG PO TABS
2.5000 mg | ORAL_TABLET | Freq: Four times a day (QID) | ORAL | Status: DC
  Administered 2021-11-20 – 2021-11-22 (×9): 2.5 mg via ORAL
  Filled 2021-11-19 (×10): qty 1

## 2021-11-19 NOTE — Progress Notes (Signed)
[facility]  ROUNDING NOTE   Subjective:   Teresa Cook is a 66 year old African-American female with past medical conditions including hypertension, gout, anxiety with depression, sickle cell trait, systolic heart failure with a EF 40 to 45%, breast cancer with bilateral mastectomy, seizures, and end-stage renal disease on hemodialysis.  Patient presents to the emergency department via EMS for multiple syncopal episodes.  Patient has been admitted for Right flank pain [R10.9] Elevated lactic acid level [R79.89] Hypotension [I95.9] Hypotension, unspecified hypotension type [I95.9] Altered mental status, unspecified altered mental status type [R41.82]  Patient is known to our practice and receives outpatient dialysis treatments at [facility] on a MWF schedule, supervised by Dr. Candiss Norse.    Patient seen and evaluated during dialysis   HEMODIALYSIS FLOWSHEET:  Blood Flow Rate (mL/min): 350 mL/min Arterial Pressure (mmHg): -160 mmHg Venous Pressure (mmHg): 280 mmHg TMP (mmHg): 0 mmHg Ultrafiltration Rate (mL/min): 142 mL/min Dialysate Flow Rate (mL/min): 300 ml/min Dialysis Fluid Bolus: Normal Saline Bolus Amount (mL): 300 mL  More cheerful disposition today, smiling Reports tenderness in abdomen.  States she's urinating more, painful Reports bleeding when she uses restroom, unsure if with urination or stool. Dark red blood on tissue.   Objective:  Vital signs in last 24 hours:  Temp:  [97.8 F (36.6 C)-99.8 F (37.7 C)] 97.8 F (36.6 C) (11/13 0930) Pulse Rate:  [69-80] 74 (11/13 1200) Resp:  [15-24] 23 (11/13 1200) BP: (124-168)/(69-94) 160/83 (11/13 1200) SpO2:  [93 %-98 %] 97 % (11/13 1200) Weight:  [63.1 kg-68.4 kg] 63.1 kg (11/13 1027)  Weight change: 3.9 kg Filed Weights   11/18/21 0500 11/19/21 0548 11/19/21 1027  Weight: 64.5 kg 68.4 kg 63.1 kg    Intake/Output: I/O last 3 completed shifts: In: 4 [P.O.:720] Out: -     Intake/Output this shift:  Total I/O In: 240 [P.O.:240] Out: -   Physical Exam: General: NAD  Head: Normocephalic, atraumatic. Moist oral mucosal membranes  Eyes: Anicteric  Lungs:  Clear to auscultation, normal effort, room air  Heart: Regular rate and rhythm  Abdomen:  Soft, tender, BS present  Extremities:  No peripheral edema.  Neurologic: Nonfocal, moving all four extremities  Skin: No lesions  Access: Lt Permcath    Basic Metabolic Panel: Recent Labs  Lab 11/14/21 0633 11/15/21 0600 11/16/21 0547 11/17/21 0455 11/18/21 0445 11/19/21 0435  NA 135  --  135  --   --  136  K 3.6  --  3.6  --   --  4.4  CL 98  --  100  --   --  98  CO2 26  --  27  --   --  28  GLUCOSE 83  --  86  --   --  83  BUN 37*  --  37*  --   --  44*  CREATININE 7.13*  --  7.01*  --   --  8.59*  CALCIUM 9.5  --  9.1  --   --  9.7  MG 1.9 1.7 2.0 1.8 1.9 2.0  PHOS 5.2*  5.3* 3.8 4.2  4.4 3.4 4.0 4.5  4.5     Liver Function Tests: Recent Labs  Lab 11/14/21 0633 11/16/21 0547 11/19/21 0435  ALBUMIN 1.9* 2.2* 2.5*    No results for input(s): "LIPASE", "AMYLASE" in the last 168 hours. No results for input(s): "AMMONIA" in the last 168 hours.  CBC: Recent Labs  Lab 11/15/21 0600 11/16/21 0547 11/17/21 0455 11/18/21 0445 11/19/21  0435  WBC 6.8 7.2 8.1 7.5 8.5  NEUTROABS 4.6 4.9 5.8 5.5 6.2  HGB 7.7* 7.7* 7.8* 7.8* 8.8*  HCT 21.6* 22.6* 22.9* 23.1* 25.8*  MCV 79.4* 80.7 81.2 81.9 81.1  PLT 230 249 260 268 326     Cardiac Enzymes: No results for input(s): "CKTOTAL", "CKMB", "CKMBINDEX", "TROPONINI" in the last 168 hours.  BNP: Invalid input(s): "POCBNP"  CBG: Recent Labs  Lab 11/12/21 1958 11/13/21 0512 11/13/21 0811 11/13/21 1201 11/13/21 1646  GLUCAP 100* 100* 35 107* 104*     Microbiology: Results for orders placed or performed during the hospital encounter of 11/05/21  Blood Culture (routine x 2)     Status: None   Collection Time: 11/05/21  3:20 AM    Specimen: BLOOD  Result Value Ref Range Status   Specimen Description BLOOD RIGHT FA  Final   Special Requests   Final    BOTTLES DRAWN AEROBIC AND ANAEROBIC Blood Culture results may not be optimal due to an inadequate volume of blood received in culture bottles   Culture   Final    NO GROWTH 5 DAYS Performed at [facility], [address] [place], [place] [address]    Report Status 11/10/2021 FINAL  Final  SARS Coronavirus 2 by RT PCR (hospital order, performed in [facility] hospital lab) *cepheid single result test* Anterior Nasal Swab     Status: None   Collection Time: 11/05/21  3:20 AM   Specimen: Anterior Nasal Swab  Result Value Ref Range Status   SARS Coronavirus 2 by RT PCR NEGATIVE NEGATIVE Final    Comment: (NOTE) SARS-CoV-2 target nucleic acids are NOT DETECTED.  The SARS-CoV-2 RNA is generally detectable in upper and lower respiratory specimens during the acute phase of infection. The lowest concentration of SARS-CoV-2 viral copies this assay can detect is 250 copies / mL. A negative result does not preclude SARS-CoV-2 infection and should not be used as the sole basis for treatment or other patient management decisions.  A negative result may occur with improper specimen collection / handling, submission of specimen other than nasopharyngeal swab, presence of viral mutation(s) within the areas targeted by this assay, and inadequate number of viral copies (<250 copies / mL). A negative result must be combined with clinical observations, patient history, and epidemiological information.  Fact Sheet for Patients:   https://www.patel.info/  Fact Sheet for Healthcare Providers: https://hall.com/  This test is not yet approved or  cleared by the [place] FDA and has been authorized for detection and/or diagnosis of SARS-CoV-2 by FDA under an Emergency Use Authorization (EUA).  This EUA will remain in effect  (meaning this test can be used) for the duration of the COVID-19 declaration under Section 564(b)(1) of the Act, 21 U.S.C. section 360bbb-3(b)(1), unless the authorization is terminated or revoked sooner.  Performed at [facility], [address] [place], [place] [address]   Blood Culture (routine x 2)     Status: None   Collection Time: 11/05/21  3:21 AM   Specimen: BLOOD  Result Value Ref Range Status   Specimen Description BLOOD RIGHT HAND  Final   Special Requests   Final    BOTTLES DRAWN AEROBIC AND ANAEROBIC Blood Culture results may not be optimal due to an inadequate volume of blood received in culture bottles   Culture   Final    NO GROWTH 5 DAYS Performed at [facility], [address] [place], [place] [address]    Report  Status 11/10/2021 FINAL  Final  MRSA Next Gen by PCR, Nasal     Status: None   Collection Time: 11/05/21  6:57 AM   Specimen: Nasal Mucosa; Nasal Swab  Result Value Ref Range Status   MRSA by PCR Next Gen NOT DETECTED NOT DETECTED Final    Comment: (NOTE) The GeneXpert MRSA Assay (FDA approved for NASAL specimens only), is one component of a comprehensive MRSA colonization surveillance program. It is not intended to diagnose MRSA infection nor to guide or monitor treatment for MRSA infections. Test performance is not FDA approved in patients less than 58 years old. Performed at [facility], [address] [place], [place] [address]     Coagulation Studies: No results for input(s): "LABPROT", "INR" in the last 72 hours.   Urinalysis: No results for input(s): "COLORURINE", "LABSPEC", "PHURINE", "GLUCOSEU", "HGBUR", "BILIRUBINUR", "KETONESUR", "PROTEINUR", "UROBILINOGEN", "NITRITE", "LEUKOCYTESUR" in the last 72 hours.  Invalid input(s): "APPERANCEUR"    Imaging: CT ABDOMEN PELVIS WO CONTRAST  Result Date: 11/17/2021 CLINICAL DATA:  Abdominal pain.  Right retroperitoneal hematoma. EXAM: CT ABDOMEN AND  PELVIS WITHOUT CONTRAST TECHNIQUE: Multidetector CT imaging of the abdomen and pelvis was performed following the standard protocol without IV contrast. RADIATION DOSE REDUCTION: This exam was performed according to the departmental dose-optimization program which includes automated exposure control, adjustment of the mA and/or kV according to patient size and/or use of iterative reconstruction technique. COMPARISON:  11/06/2021 FINDINGS: Lower chest: Small bilateral pleural effusions with overlying compressive type atelectasis. Hepatobiliary: No focal liver abnormality is seen. No gallstones, gallbladder wall thickening, or biliary dilatation. Vicarious excretion of contrast material into the gallbladder is noted. Pancreas: Unremarkable. No pancreatic ductal dilatation or surrounding inflammatory changes. Spleen: Normal in size without focal abnormality. Adrenals/Urinary Tract: Normal adrenal glands. Bilateral atrophic in stage cystic kidneys are identified. The right kidney subcapsular hematoma measures 6.4 x 3.1 by 10.0 cm (volume = 100 cm^3). On the previous exam 6.9 x 3.5 by 10.6 cm (volume = 130 cm^3). Again seen are signs of right renal artery coil embolization. Urinary bladder appears decompressed. Stomach/Bowel: Stomach appears normal. No bowel wall thickening, inflammation, or distension. Vascular/Lymphatic: Extensive aortic atherosclerotic disease. No signs of aneurysm. No abdominopelvic adenopathy. Reproductive: Status post hysterectomy. No adnexal masses. Other: Large right retroperitoneal hematoma is again noted. This is decreased in density when compared with the previous exam compatible with subacute hematoma. On today's study this measures 14.7 x 5.1 by 25.2 cm, image 88/6 and image 48/2. On the previous exam this measured 14.7 x 4.6 by 26.2 cm. Decrease in volume and density hemoperitoneum within the dependent portion of the pelvis. Musculoskeletal: No acute or suspicious osseous findings. Stigmata  of renal osteodystrophy is again noted. IMPRESSION: 1. Similar size of large right retroperitoneal hematoma which is decreased in density when compared with 11/05/2021 compatible with subacute hematoma. 2. Subcapsular hematoma overlying the right kidney is decreased in size from prior exam. 3. Resolving hemoperitoneum is decreased in volume and density compared with the previous exam. 4. Similar, small bilateral pleural effusions with overlying compressive type atelectasis. 5.  Aortic Atherosclerosis (ICD10-I70.0). Electronically Signed   By: Kerby Moors M.D.   On: 11/17/2021 18:31     Medications:    sodium chloride 250 mL (11/12/21 2214)   anticoagulant sodium citrate      amiodarone  200 mg Oral BID   apixaban  2.5 mg Oral BID   carvedilol  12.5 mg Oral BID WC   Chlorhexidine Gluconate Cloth  6 each Topical Q0600   cinacalcet  60 mg Oral QAC lunch   epoetin alfa       epoetin (EPOGEN/PROCRIT) injection  10,000 Units Intravenous Q M,W,F-HD   famotidine  20 mg Oral Daily   feeding supplement (NEPRO CARB STEADY)  237 mL Oral TID BM   heparin sodium (porcine)       lidocaine  2 patch Transdermal Q24H   multivitamin  1 tablet Oral QHS   pantoprazole  40 mg Oral BID   polyethylene glycol  17 g Oral Daily   sacubitril-valsartan  1 tablet Oral BID   senna-docusate  1 tablet Oral BID   sertraline  100 mg Oral Daily   sevelamer carbonate  2,400 mg Oral TID with meals   traZODone  50 mg Oral QHS   zolpidem  5 mg Oral QHS   acetaminophen, ALPRAZolam, alteplase, alum & mag hydroxide-simeth, anticoagulant sodium citrate, bisacodyl, epoetin alfa, fluticasone, heparin, heparin sodium (porcine), lidocaine (PF), lidocaine-prilocaine, loperamide, methocarbamol, metoprolol tartrate, ondansetron (ZOFRAN) IV, mouth rinse, oxyCODONE, pentafluoroprop-tetrafluoroeth, sodium chloride  Assessment/ Plan:  [name] is a 66 y.o.  female is a 66 year old African-American female with past medical  conditions including hypertension, gout, anxiety with depression, sickle cell trait, systolic heart failure with a EF 40 to 45%, breast cancer with bilateral mastectomy, seizures, and end-stage renal disease on hemodialysis.  Patient presents to the emergency department via EMS for multiple syncopal episodes.  Patient has been admitted for Right flank pain [R10.9] Elevated lactic acid level [R79.89] Hypotension [I95.9] Hypotension, unspecified hypotension type [I95.9] Altered mental status, unspecified altered mental status type [R41.82]  [facility]/MWF/Lt Permcath  End-stage renal disease on hemodialysis.  Will maintain outpatient schedule if possible. Receiving dialysis today, UF 0. Next treatment scheduled for Wednesday.   2. Anemia of chronic kidney disease with acute blood loss Lab Results  Component Value Date   HGB 8.8 (L) 11/19/2021  CT angio abdomen pelvis shows severe hemorrhage at right kidney.  Appreciate vascular surgery performing right renal angiogram with coil embolization to manage hemorrhage.  Patient received 2 units FFP and blood transfusions thus far during admission. Hgb stable. Patient reporting melena, relayed to primary team. Holding heparin with dialysis. Only used to hep-lock permcath. Will continue EPO with treatments.    3. Secondary Hyperparathyroidism: with outpatient labs: PTH 379, phosphorus 5.7, calcium 9.4 on June 18, 2021.   Lab Results  Component Value Date   PTH 214 (H) 07/08/2021   CALCIUM 9.7 11/19/2021   PHOS 4.5 11/19/2021   PHOS 4.5 11/19/2021  Calcium and phosphorus within desired range. Continue sevelamer with all meals.  4.  Hypotension likely due to blood loss, Monitoring with recent reports of melena. Blood pressure remains stable for now.   5.  Hyperkalemia, Corrected with dialysis   LOS: [place] 11/13/202312:16 PM

## 2021-11-19 NOTE — Care Management Important Message (Signed)
Important Message  Patient Details  Name: Teresa Cook MRN: [id number] Date of Birth: 06-16-1955   Medicare Important Message Given:  Yes  Patient out of room upon time of visit for dialysis.  Copy of Medicare IM left in room for reference.    [name] 11/19/2021, 12:00 PM

## 2021-11-19 NOTE — Progress Notes (Signed)
PROGRESS NOTE    Teresa Cook  PFX:[id number] DOB: Feb 12, 1955 DOA: 11/05/2021 [facility]: [facility]    Brief Narrative:  66 y.o female with PMH significant for ESRD on HD, A. Fib & DVT on Warfarin , admitted on 10/30 with Hemorrhagic shock, requiring pressor support initially, due to right renal hemorrhage with large hematoma in setting of supratherapeutic INR (INR >9).  Vascular Surgery consulted, required coil embolization of the right renal artery.  By 11/3, able to be weaned off of pressor support.  Transferred to the hospitalist service by 11/4.  Challenged with IV heparin on 11/7 and transitioned to p.o. Eliquis on 11/8.  Hemoglobin has since remained stable.  Pain continues to be an issue.  CT scan done 11/11 withOUT worsening of hematomas.  On morning of 7/13, patient reported to have blood in her commode, unsure if this was urine or stool.  Assessment & Plan:   Principal Problem:   Shock ([place]) Active Problems:   Acute respiratory failure with hypoxia ([place])   ESRD on dialysis ([place])   End-stage renal disease on hemodialysis ([facility])   HTN (hypertension)   PAF (paroxysmal atrial fibrillation) ([facility])   Hypotension   Anemia due to acute blood loss   Right flank pain   Retroperitoneal bleed   Altered mental status   Elevated lactic acid level   Atrial fibrillation with rapid ventricular response ([facility])   Hemorrhagic shock in the setting of right renal hemorrhage Supratherapeutic INR status post Coumadin reversal agent Acute blood loss anemia Patient required coil embolization of right renal artery by vascular surgery.  Hemoglobin overall stable.  INR 1.5.  Shock resolved.  Neo-Synephrine discontinued.  Now on p.o. Eliquis and hemoglobin remained stable.  Continue to follow.  Noted episode of blood in vault.  Urinalysis pending.  Recheck hemoglobin in the morning -CT scan shows stability of hematomas -patient still having a lot of pain-trying scheduled plus as  needed  Ileus, resolved Patient had worsening of abdominal distention noted on 11/3.  KUB revealed gaseous distention consistent with ileus.  No signs of obstruction or free air under the diaphragm.   +BMs  Atrial tachycardia/PNSVT/atrial fibrillation with rapid ventricular response Rate control improved.  On p.o. amiodarone and Eliquis.  ESRD on HD Nephrology following.  On hemodialysis Monday/Wednesday/Friday.  Heart failure with preserved ejection fraction LVEF 50 to 32%, grade 1 diastolic dysfunction.  Volume is managed by dialysis.  Started on [facility] 11/9.  Hold Imdur for now, can consider restarting if blood pressure tolerates  GERD -PPI   DVT prophylaxis: P.o. Eliquis Code Status: DNR Disposition Plan: Status is: Inpatient Remains inpatient appropriate because: -pain control prior to d/c -Monitoring of hemoglobin   Consultants:  Nephrology  Procedures:  Coil embolization   Subjective: Still with pain on right side and flank  Objective: Vitals:   11/19/21 1230 11/19/21 1249 11/19/21 1256 11/19/21 1340  BP: (!) 154/74 (!) 151/95 (!) 145/77 (!) 154/77  Pulse: 77 74  76  Resp: (!) '25 14  18  '$ Temp:  98.6 F (37 C)  98.7 F (37.1 C)  TempSrc:  Oral  Oral  SpO2: 96%   94%  Weight:      Height:        Intake/Output Summary (Last 24 hours) at 11/19/2021 1625 Last data filed at 11/19/2021 1249 Gross per 24 hour  Intake 480 ml  Output 0 ml  Net 480 ml    Filed Weights   11/18/21 0500 11/19/21 0548 11/19/21  1027  Weight: 64.5 kg 68.4 kg 63.1 kg    Examination:  General: Appearance:  Alert and oriented x3, fatigued  abd Tender to touch in RLQ  Lungs:     Clear to auscultation bilaterally, respirations unlabored  Heart:  Regular rate and rhythm, S1-S2  MS: No clubbing or cyanosis or edema  Neurologic: No focal deficits    Data Reviewed: I have personally reviewed following labs and imaging studies  CBC: Recent Labs  Lab 11/15/21 0600  11/16/21 0547 11/17/21 0455 11/18/21 0445 11/19/21 0435  WBC 6.8 7.2 8.1 7.5 8.5  NEUTROABS 4.6 4.9 5.8 5.5 6.2  HGB 7.7* 7.7* 7.8* 7.8* 8.8*  HCT 21.6* 22.6* 22.9* 23.1* 25.8*  MCV 79.4* 80.7 81.2 81.9 81.1  PLT 230 249 260 268 644    Basic Metabolic Panel: Recent Labs  Lab 11/14/21 0633 11/15/21 0600 11/16/21 0547 11/17/21 0455 11/18/21 0445 11/19/21 0435  NA 135  --  135  --   --  136  K 3.6  --  3.6  --   --  4.4  CL 98  --  100  --   --  98  CO2 26  --  27  --   --  28  GLUCOSE 83  --  86  --   --  83  BUN 37*  --  37*  --   --  44*  CREATININE 7.13*  --  7.01*  --   --  8.59*  CALCIUM 9.5  --  9.1  --   --  9.7  MG 1.9 1.7 2.0 1.8 1.9 2.0  PHOS 5.2*  5.3* 3.8 4.2  4.4 3.4 4.0 4.5  4.5    GFR: Estimated Creatinine Clearance: 5.7 mL/min (A) (by C-G formula based on SCr of 8.59 mg/dL (H)). Liver Function Tests: Recent Labs  Lab 11/14/21 0347 11/16/21 0547 11/19/21 0435  ALBUMIN 1.9* 2.2* 2.5*    No results for input(s): "LIPASE", "AMYLASE" in the last 168 hours. No results for input(s): "AMMONIA" in the last 168 hours. Coagulation Profile: Recent Labs  Lab 11/13/21 1135  INR 1.4*    Cardiac Enzymes: No results for input(s): "CKTOTAL", "CKMB", "CKMBINDEX", "TROPONINI" in the last 168 hours. BNP (last 3 results) No results for input(s): "PROBNP" in the last 8760 hours. HbA1C: No results for input(s): "HGBA1C" in the last 72 hours. CBG: Recent Labs  Lab 11/12/21 1958 11/13/21 0512 11/13/21 0811 11/13/21 1201 11/13/21 1646  GLUCAP 100* 100* 84 107* 104*     Radiology Studies: CT ABDOMEN PELVIS WO CONTRAST  Result Date: 11/17/2021 CLINICAL DATA:  Abdominal pain.  Right retroperitoneal hematoma. EXAM: CT ABDOMEN AND PELVIS WITHOUT CONTRAST TECHNIQUE: Multidetector CT imaging of the abdomen and pelvis was performed following the standard protocol without IV contrast. RADIATION DOSE REDUCTION: This exam was performed according to the  departmental dose-optimization program which includes automated exposure control, adjustment of the mA and/or kV according to patient size and/or use of iterative reconstruction technique. COMPARISON:  11/06/2021 FINDINGS: Lower chest: Small bilateral pleural effusions with overlying compressive type atelectasis. Hepatobiliary: No focal liver abnormality is seen. No gallstones, gallbladder wall thickening, or biliary dilatation. Vicarious excretion of contrast material into the gallbladder is noted. Pancreas: Unremarkable. No pancreatic ductal dilatation or surrounding inflammatory changes. Spleen: Normal in size without focal abnormality. Adrenals/Urinary Tract: Normal adrenal glands. Bilateral atrophic in stage cystic kidneys are identified. The right kidney subcapsular hematoma measures 6.4 x 3.1 by 10.0 cm (volume = 100 cm^3). On  the previous exam 6.9 x 3.5 by 10.6 cm (volume = 130 cm^3). Again seen are signs of right renal artery coil embolization. Urinary bladder appears decompressed. Stomach/Bowel: Stomach appears normal. No bowel wall thickening, inflammation, or distension. Vascular/Lymphatic: Extensive aortic atherosclerotic disease. No signs of aneurysm. No abdominopelvic adenopathy. Reproductive: Status post hysterectomy. No adnexal masses. Other: Large right retroperitoneal hematoma is again noted. This is decreased in density when compared with the previous exam compatible with subacute hematoma. On today's study this measures 14.7 x 5.1 by 25.2 cm, image 88/6 and image 48/2. On the previous exam this measured 14.7 x 4.6 by 26.2 cm. Decrease in volume and density hemoperitoneum within the dependent portion of the pelvis. Musculoskeletal: No acute or suspicious osseous findings. Stigmata of renal osteodystrophy is again noted. IMPRESSION: 1. Similar size of large right retroperitoneal hematoma which is decreased in density when compared with 11/05/2021 compatible with subacute hematoma. 2. Subcapsular  hematoma overlying the right kidney is decreased in size from prior exam. 3. Resolving hemoperitoneum is decreased in volume and density compared with the previous exam. 4. Similar, small bilateral pleural effusions with overlying compressive type atelectasis. 5.  Aortic Atherosclerosis (ICD10-I70.0). Electronically Signed   By: Kerby Moors M.D.   On: 11/17/2021 18:31        Scheduled Meds:  amiodarone  200 mg Oral BID   apixaban  2.5 mg Oral BID   carvedilol  12.5 mg Oral BID WC   Chlorhexidine Gluconate Cloth  6 each Topical Q0600   cinacalcet  60 mg Oral QAC lunch   epoetin (EPOGEN/PROCRIT) injection  10,000 Units Intravenous Q M,W,F-HD   famotidine  20 mg Oral Daily   feeding supplement (NEPRO CARB STEADY)  237 mL Oral TID BM   lidocaine  2 patch Transdermal Q24H   multivitamin  1 tablet Oral QHS   oxyCODONE  2.5 mg Oral Q6H   pantoprazole  40 mg Oral BID   polyethylene glycol  17 g Oral Daily   sacubitril-valsartan  1 tablet Oral BID   senna-docusate  1 tablet Oral BID   sertraline  100 mg Oral Daily   sevelamer carbonate  2,400 mg Oral TID with meals   traZODone  50 mg Oral QHS   zolpidem  5 mg Oral QHS   Continuous Infusions:  sodium chloride 250 mL (11/12/21 2214)   anticoagulant sodium citrate       LOS: 14 days      Annita Brod, MD [facility]   If 7PM-7AM, please contact night-coverage  11/19/2021, 4:25 PM

## 2021-11-20 DIAGNOSIS — I4891 Unspecified atrial fibrillation: Secondary | ICD-10-CM | POA: Diagnosis not present

## 2021-11-20 DIAGNOSIS — R58 Hemorrhage, not elsewhere classified: Secondary | ICD-10-CM | POA: Diagnosis not present

## 2021-11-20 DIAGNOSIS — R579 Shock, unspecified: Secondary | ICD-10-CM | POA: Diagnosis not present

## 2021-11-20 DIAGNOSIS — N186 End stage renal disease: Secondary | ICD-10-CM | POA: Diagnosis not present

## 2021-11-20 LAB — CBC WITH DIFFERENTIAL/PLATELET
Abs Immature Granulocytes: 0.04 10*3/uL (ref 0.00–0.07)
Basophils Absolute: 0 10*3/uL (ref 0.0–0.1)
Basophils Relative: 1 %
Eosinophils Absolute: 0.1 10*3/uL (ref 0.0–0.5)
Eosinophils Relative: 2 %
HCT: 24 % — ABNORMAL LOW (ref 36.0–46.0)
Hemoglobin: 8 g/dL — ABNORMAL LOW (ref 12.0–15.0)
Immature Granulocytes: 1 %
Lymphocytes Relative: 16 %
Lymphs Abs: 1 10*3/uL (ref 0.7–4.0)
MCH: 27.6 pg (ref 26.0–34.0)
MCHC: 33.3 g/dL (ref 30.0–36.0)
MCV: 82.8 fL (ref 80.0–100.0)
Monocytes Absolute: 0.8 10*3/uL (ref 0.1–1.0)
Monocytes Relative: 12 %
Neutro Abs: 4.5 10*3/uL (ref 1.7–7.7)
Neutrophils Relative %: 68 %
Platelets: 244 10*3/uL (ref 150–400)
RBC: 2.9 MIL/uL — ABNORMAL LOW (ref 3.87–5.11)
RDW: 17 % — ABNORMAL HIGH (ref 11.5–15.5)
WBC: 6.5 10*3/uL (ref 4.0–10.5)
nRBC: 0 % (ref 0.0–0.2)

## 2021-11-20 LAB — PHOSPHORUS: Phosphorus: 4 mg/dL (ref 2.5–4.6)

## 2021-11-20 LAB — MAGNESIUM: Magnesium: 1.8 mg/dL (ref 1.7–2.4)

## 2021-11-20 MED ORDER — POLYETHYLENE GLYCOL 3350 17 G PO PACK: 17.0000 g | PACK | Freq: Every day | ORAL | Status: DC | PRN

## 2021-11-20 MED ORDER — SENNOSIDES-DOCUSATE SODIUM 8.6-50 MG PO TABS: 1.0000 | ORAL_TABLET | Freq: Two times a day (BID) | ORAL | Status: DC | PRN

## 2021-11-20 NOTE — Progress Notes (Signed)
PROGRESS NOTE    Teresa Cook  UYQ:[id number] DOB: 12/28/1955 DOA: 11/05/2021 PCP: [facility]    Brief Narrative:  66 y.o female with PMH significant for ESRD on HD, A. Fib & DVT on Warfarin , admitted on 10/30 with Hemorrhagic shock, requiring pressor support initially, due to right renal hemorrhage with large hematoma in setting of supratherapeutic INR (INR >9).  Vascular Surgery consulted, required coil embolization of the right renal artery.  By 11/3, able to be weaned off of pressor support.  Transferred to the hospitalist service by 11/4.  Challenged with IV heparin on 11/7 and transitioned to p.o. Eliquis on 11/8.  Hemoglobin has since remained stable.  Pain continues to be an issue.  CT scan done 11/11 withOUT worsening of hematomas.  On morning of 11/13, patient reported to have blood in her commode, unsure if this was urine or stool.  Patient still reports blood as of early this morning.  Assessment & Plan:   Principal Problem:   Shock ([place]) Active Problems:   Acute respiratory failure with hypoxia ([place])   ESRD on dialysis ([place])   End-stage renal disease on hemodialysis ([facility])   HTN (hypertension)   PAF (paroxysmal atrial fibrillation) ([facility])   Hypotension   Anemia due to acute blood loss   Right flank pain   Retroperitoneal bleed   Altered mental status   Elevated lactic acid level   Atrial fibrillation with rapid ventricular response ([facility])   Hemorrhagic shock in the setting of right renal hemorrhage Supratherapeutic INR status post Coumadin reversal agent Acute blood loss anemia Patient required coil embolization of right renal artery by vascular surgery.  Hemoglobin overall stable.  INR 1.5.  Shock resolved.  Neo-Synephrine discontinued.  Now on p.o. Eliquis and hemoglobin remained stable.  CT scan shows stability of hematomas.  Continue to follow.  Noted episode of blood in vault.  Urinalysis pending.  Hemoglobin with mild drop from previous  day.  Still reporting some hematuria-patient still having a lot of pain-trying scheduled pain medication plus as needed  Ileus, resolved Patient had worsening of abdominal distention noted on 11/3.  KUB revealed gaseous distention consistent with ileus.  No signs of obstruction or free air under the diaphragm.   +BMs  Atrial tachycardia/PNSVT/atrial fibrillation with rapid ventricular response Rate control improved.  On p.o. amiodarone and Eliquis.  ESRD on HD Nephrology following.  On hemodialysis Monday/Wednesday/Friday.  Heart failure with preserved ejection fraction LVEF 50 to 63%, grade 1 diastolic dysfunction.  Volume is managed by dialysis.  Started on [facility] 11/9.  Hold Imdur for now, can consider restarting if blood pressure tolerates  GERD -PPI   DVT prophylaxis: P.o. Eliquis Code Status: DNR Disposition Plan: Status is: Inpatient Remains inpatient appropriate because: -pain control prior to d/c -Monitoring of hemoglobin   Consultants:  Nephrology  Procedures:  Coil embolization   Subjective: Some pain, manageable.  Objective: Vitals:   11/19/21 2015 11/20/21 0349 11/20/21 0500 11/20/21 0943  BP: (!) 149/81 112/60  137/72  Pulse: 83 77  78  Resp: '20 20  18  '$ Temp: 99.5 F (37.5 C) 99 F (37.2 C)  98.2 F (36.8 C)  TempSrc: Oral Oral  Oral  SpO2: 93% 90%  94%  Weight:   68 kg   Height:        Intake/Output Summary (Last 24 hours) at 11/20/2021 1409 Last data filed at 11/20/2021 0541 Gross per 24 hour  Intake 0 ml  Output 0 ml  Net  0 ml    Filed Weights   11/19/21 0548 11/19/21 1027 11/20/21 0500  Weight: 68.4 kg 63.1 kg 68 kg    Examination: General: Alert and oriented x3, fatigued HEENT: Normocephalic and atraumatic, mucous membranes are slightly dry Cardiovascular: Irregular rhythm, rate controlled Lungs: Clear to auscultation bilaterally Abdomen: Soft, mild tenderness in the right lower quadrant, nondistended, hypoactive bowel  sounds Extremities: No clubbing or cyanosis or edema  Data Reviewed: I have personally reviewed following labs and imaging studies Hemoglobin at 8, down from 8.8  CBC: Recent Labs  Lab 11/16/21 0547 11/17/21 0455 11/18/21 0445 11/19/21 0435 11/20/21 0447  WBC 7.2 8.1 7.5 8.5 6.5  NEUTROABS 4.9 5.8 5.5 6.2 4.5  HGB 7.7* 7.8* 7.8* 8.8* 8.0*  HCT 22.6* 22.9* 23.1* 25.8* 24.0*  MCV 80.7 81.2 81.9 81.1 82.8  PLT 249 260 268 326 606    Basic Metabolic Panel: Recent Labs  Lab 11/14/21 0633 11/15/21 0600 11/16/21 0547 11/17/21 0455 11/18/21 0445 11/19/21 0435 11/20/21 0447  NA 135  --  135  --   --  136  --   K 3.6  --  3.6  --   --  4.4  --   CL 98  --  100  --   --  98  --   CO2 26  --  27  --   --  28  --   GLUCOSE 83  --  86  --   --  83  --   BUN 37*  --  37*  --   --  44*  --   CREATININE 7.13*  --  7.01*  --   --  8.59*  --   CALCIUM 9.5  --  9.1  --   --  9.7  --   MG 1.9   < > 2.0 1.8 1.9 2.0 1.8  PHOS 5.2*  5.3*   < > 4.2  4.4 3.4 4.0 4.5  4.5 4.0   < > = values in this interval not displayed.    GFR: Estimated Creatinine Clearance: 5.7 mL/min (A) (by C-G formula based on SCr of 8.59 mg/dL (H)). Liver Function Tests: Recent Labs  Lab 11/14/21 3016 11/16/21 0547 11/19/21 0435  ALBUMIN 1.9* 2.2* 2.5*    No results for input(s): "LIPASE", "AMYLASE" in the last 168 hours. No results for input(s): "AMMONIA" in the last 168 hours. Coagulation Profile: No results for input(s): "INR", "PROTIME" in the last 168 hours.  Cardiac Enzymes: No results for input(s): "CKTOTAL", "CKMB", "CKMBINDEX", "TROPONINI" in the last 168 hours. BNP (last 3 results) No results for input(s): "PROBNP" in the last 8760 hours. HbA1C: No results for input(s): "HGBA1C" in the last 72 hours. CBG: Recent Labs  Lab 11/13/21 1646  GLUCAP 104*     Radiology Studies: No results found.      Scheduled Meds:  amiodarone  200 mg Oral BID   apixaban  2.5 mg Oral BID    carvedilol  12.5 mg Oral BID WC   Chlorhexidine Gluconate Cloth  6 each Topical Q0600   cinacalcet  60 mg Oral QAC lunch   epoetin (EPOGEN/PROCRIT) injection  10,000 Units Intravenous Q M,W,F-HD   famotidine  20 mg Oral Daily   lidocaine  2 patch Transdermal Q24H   multivitamin  1 tablet Oral QHS   oxyCODONE  2.5 mg Oral Q6H   pantoprazole  40 mg Oral BID   sacubitril-valsartan  1 tablet Oral BID   sertraline  100  mg Oral Daily   sevelamer carbonate  2,400 mg Oral TID with meals   traZODone  50 mg Oral QHS   zolpidem  5 mg Oral QHS   Continuous Infusions:  sodium chloride 250 mL (11/12/21 2214)   anticoagulant sodium citrate       LOS: 15 days      Annita Brod, MD [facility]   If 7PM-7AM, please contact night-coverage  11/20/2021, 2:09 PM

## 2021-11-20 NOTE — Progress Notes (Addendum)
[facility]  ROUNDING NOTE   Subjective:   Teresa Cook is a 66 year old African-American female with past medical conditions including hypertension, gout, anxiety with depression, sickle cell trait, systolic heart failure with a EF 40 to 45%, breast cancer with bilateral mastectomy, seizures, and end-stage renal disease on hemodialysis.  Patient presents to the emergency department via EMS for multiple syncopal episodes.  Patient has been admitted for Right flank pain [R10.9] Elevated lactic acid level [R79.89] Hypotension [I95.9] Hypotension, unspecified hypotension type [I95.9] Altered mental status, unspecified altered mental status type [R41.82]  Patient is known to our practice and receives outpatient dialysis treatments at [facility] on a MWF schedule, supervised by Dr. Candiss Norse.    Patient resting in bed, alert and oriented States she feels well today.  Continues to have abd and lower back discomfort Reports blood in urine.    Objective:  Vital signs in last 24 hours:  Temp:  [98.2 F (36.8 C)-99.5 F (37.5 C)] 98.2 F (36.8 C) (11/14 0943) Pulse Rate:  [74-83] 78 (11/14 0943) Resp:  [14-25] 18 (11/14 0943) BP: (112-168)/(60-95) 137/72 (11/14 0943) SpO2:  [90 %-98 %] 94 % (11/14 0943) Weight:  [68 kg] 68 kg (11/14 0500)  Weight change: -5.3 kg Filed Weights   11/19/21 0548 11/19/21 1027 11/20/21 0500  Weight: 68.4 kg 63.1 kg 68 kg    Intake/Output: I/O last 3 completed shifts: In: 480 [P.O.:480] Out: 0    Intake/Output this shift:  No intake/output data recorded.  Physical Exam: General: NAD  Head: Normocephalic, atraumatic. Moist oral mucosal membranes  Eyes: Anicteric  Lungs:  Clear to auscultation, normal effort, room air  Heart: Regular rate and rhythm  Abdomen:  Soft, tender, BS present  Extremities:  No peripheral edema.  Neurologic: Nonfocal, moving all four extremities  Skin: No lesions  Access: Lt Permcath    Basic  Metabolic Panel: Recent Labs  Lab 11/14/21 0633 11/15/21 0600 11/16/21 0547 11/17/21 0455 11/18/21 0445 11/19/21 0435 11/20/21 0447  NA 135  --  135  --   --  136  --   K 3.6  --  3.6  --   --  4.4  --   CL 98  --  100  --   --  98  --   CO2 26  --  27  --   --  28  --   GLUCOSE 83  --  86  --   --  83  --   BUN 37*  --  37*  --   --  44*  --   CREATININE 7.13*  --  7.01*  --   --  8.59*  --   CALCIUM 9.5  --  9.1  --   --  9.7  --   MG 1.9   < > 2.0 1.8 1.9 2.0 1.8  PHOS 5.2*  5.3*   < > 4.2  4.4 3.4 4.0 4.5  4.5 4.0   < > = values in this interval not displayed.     Liver Function Tests: Recent Labs  Lab 11/14/21 9485 11/16/21 0547 11/19/21 0435  ALBUMIN 1.9* 2.2* 2.5*    No results for input(s): "LIPASE", "AMYLASE" in the last 168 hours. No results for input(s): "AMMONIA" in the last 168 hours.  CBC: Recent Labs  Lab 11/16/21 0547 11/17/21 0455 11/18/21 0445 11/19/21 0435 11/20/21 0447  WBC 7.2 8.1 7.5 8.5 6.5  NEUTROABS 4.9 5.8 5.5 6.2 4.5  HGB 7.7* 7.8*  7.8* 8.8* 8.0*  HCT 22.6* 22.9* 23.1* 25.8* 24.0*  MCV 80.7 81.2 81.9 81.1 82.8  PLT 249 260 268 326 244     Cardiac Enzymes: No results for input(s): "CKTOTAL", "CKMB", "CKMBINDEX", "TROPONINI" in the last 168 hours.  BNP: Invalid input(s): "POCBNP"  CBG: Recent Labs  Lab 11/13/21 1201 11/13/21 1646  GLUCAP 107* 104*     Microbiology: Results for orders placed or performed during the hospital encounter of 11/05/21  Blood Culture (routine x 2)     Status: None   Collection Time: 11/05/21  3:20 AM   Specimen: BLOOD  Result Value Ref Range Status   Specimen Description BLOOD RIGHT FA  Final   Special Requests   Final    BOTTLES DRAWN AEROBIC AND ANAEROBIC Blood Culture results may not be optimal due to an inadequate volume of blood received in culture bottles   Culture   Final    NO GROWTH 5 DAYS Performed at [facility], [address] [place], [place] [address]     Report Status 11/10/2021 FINAL  Final  SARS Coronavirus 2 by RT PCR (hospital order, performed in [facility] hospital lab) *cepheid single result test* Anterior Nasal Swab     Status: None   Collection Time: 11/05/21  3:20 AM   Specimen: Anterior Nasal Swab  Result Value Ref Range Status   SARS Coronavirus 2 by RT PCR NEGATIVE NEGATIVE Final    Comment: (NOTE) SARS-CoV-2 target nucleic acids are NOT DETECTED.  The SARS-CoV-2 RNA is generally detectable in upper and lower respiratory specimens during the acute phase of infection. The lowest concentration of SARS-CoV-2 viral copies this assay can detect is 250 copies / mL. A negative result does not preclude SARS-CoV-2 infection and should not be used as the sole basis for treatment or other patient management decisions.  A negative result may occur with improper specimen collection / handling, submission of specimen other than nasopharyngeal swab, presence of viral mutation(s) within the areas targeted by this assay, and inadequate number of viral copies (<250 copies / mL). A negative result must be combined with clinical observations, patient history, and epidemiological information.  Fact Sheet for Patients:   https://www.patel.info/  Fact Sheet for Healthcare Providers: https://hall.com/  This test is not yet approved or  cleared by the [place] FDA and has been authorized for detection and/or diagnosis of SARS-CoV-2 by FDA under an Emergency Use Authorization (EUA).  This EUA will remain in effect (meaning this test can be used) for the duration of the COVID-19 declaration under Section 564(b)(1) of the Act, 21 U.S.C. section 360bbb-3(b)(1), unless the authorization is terminated or revoked sooner.  Performed at [facility], [address] [place], [place] [address]   Blood Culture (routine x 2)     Status: None   Collection Time: 11/05/21  3:21 AM   Specimen:  BLOOD  Result Value Ref Range Status   Specimen Description BLOOD RIGHT HAND  Final   Special Requests   Final    BOTTLES DRAWN AEROBIC AND ANAEROBIC Blood Culture results may not be optimal due to an inadequate volume of blood received in culture bottles   Culture   Final    NO GROWTH 5 DAYS Performed at [facility], [address], [place], [place] [address]    Report Status 11/10/2021 FINAL  Final  MRSA Next Gen by PCR, Nasal     Status: None   Collection Time: 11/05/21  6:57 AM  Specimen: Nasal Mucosa; Nasal Swab  Result Value Ref Range Status   MRSA by PCR Next Gen NOT DETECTED NOT DETECTED Final    Comment: (NOTE) The GeneXpert MRSA Assay (FDA approved for NASAL specimens only), is one component of a comprehensive MRSA colonization surveillance program. It is not intended to diagnose MRSA infection nor to guide or monitor treatment for MRSA infections. Test performance is not FDA approved in patients less than 98 years old. Performed at [facility], [address] [place], [place] [address]     Coagulation Studies: No results for input(s): "LABPROT", "INR" in the last 72 hours.   Urinalysis: No results for input(s): "COLORURINE", "LABSPEC", "PHURINE", "GLUCOSEU", "HGBUR", "BILIRUBINUR", "KETONESUR", "PROTEINUR", "UROBILINOGEN", "NITRITE", "LEUKOCYTESUR" in the last 72 hours.  Invalid input(s): "APPERANCEUR"    Imaging: No results found.   Medications:    sodium chloride 250 mL (11/12/21 2214)   anticoagulant sodium citrate      amiodarone  200 mg Oral BID   apixaban  2.5 mg Oral BID   carvedilol  12.5 mg Oral BID WC   Chlorhexidine Gluconate Cloth  6 each Topical Q0600   cinacalcet  60 mg Oral QAC lunch   epoetin (EPOGEN/PROCRIT) injection  10,000 Units Intravenous Q M,W,F-HD   famotidine  20 mg Oral Daily   lidocaine  2 patch Transdermal Q24H   multivitamin  1 tablet Oral QHS   oxyCODONE  2.5 mg Oral Q6H   pantoprazole  40 mg Oral  BID   sacubitril-valsartan  1 tablet Oral BID   sertraline  100 mg Oral Daily   sevelamer carbonate  2,400 mg Oral TID with meals   traZODone  50 mg Oral QHS   zolpidem  5 mg Oral QHS   acetaminophen, ALPRAZolam, alteplase, alum & mag hydroxide-simeth, anticoagulant sodium citrate, bisacodyl, fluticasone, heparin, lidocaine (PF), lidocaine-prilocaine, loperamide, methocarbamol, metoprolol tartrate, ondansetron (ZOFRAN) IV, mouth rinse, oxyCODONE, pentafluoroprop-tetrafluoroeth, polyethylene glycol, senna-docusate, sodium chloride  Assessment/ Plan:  Teresa Cook is a 66 y.o.  female is a 66 year old African-American female with past medical conditions including hypertension, gout, anxiety with depression, sickle cell trait, systolic heart failure with a EF 40 to 45%, breast cancer with bilateral mastectomy, seizures, and end-stage renal disease on hemodialysis.  Patient presents to the emergency department via EMS for multiple syncopal episodes.  Patient has been admitted for Right flank pain [R10.9] Elevated lactic acid level [R79.89] Hypotension [I95.9] Hypotension, unspecified hypotension type [I95.9] Altered mental status, unspecified altered mental status type [R41.82]  [facility]/MWF/Lt Permcath  End-stage renal disease on hemodialysis.  Will maintain outpatient schedule if possible. Next treatment scheduled for Wednesday.  2. Anemia of chronic kidney disease with acute blood loss Lab Results  Component Value Date   HGB 8.0 (L) 11/20/2021  CT angio abdomen pelvis shows severe hemorrhage at right kidney.  Appreciate vascular surgery performing right renal angiogram with coil embolization to manage hemorrhage.  Patient received 2 units FFP and blood transfusions thus far during admission. Hgb decreased today, but remains acceptable. Will continue to monitor. Primary team monitoring for melena. May require stopping anticoagulation.    3. Secondary Hyperparathyroidism:  with outpatient labs: PTH 379, phosphorus 5.7, calcium 9.4 on June 18, 2021.   Lab Results  Component Value Date   PTH 214 (H) 07/08/2021   CALCIUM 9.7 11/19/2021   PHOS 4.0 11/20/2021  Will continue to monitor bone minerals during this admission.  Continue sevelamer with all meals.  4.  Hypotension likely due  to blood loss, Monitoring with recent reports of melena. Blood pressure stable.   5.  Hyperkalemia, Corrected with dialysis   LOS: [place] 11/14/202311:00 AM

## 2021-11-20 NOTE — Progress Notes (Signed)
Physical Therapy Treatment Patient Details Name: Teresa Cook MRN: [id number] DOB: [birth year] Today's Date: 11/20/2021   History of Present Illness Patient is a 66 year old female with medical history significant for ESRD on HD, A. Fib & DVT on Warfarin , admitted with Hemorrhagic shock due to right renal hemorrhage with large hematoma in setting of supratherapeutic INR (INR >9).  Vascular Surgery consulted, required coil embolization of the right renal artery.    PT Comments    Pt found supine in bed upon PT entry. Pt Mod I with bed mobility. Sit<>Stand with RW and CGA. Pt ambulated 220 ft with RW and CGA with one episode of "unsteadiness" that was self corrected with no other LOB noted. Pt required min VC to keep feet inside the RW and was able to demonstrate understanding of PT cueing. Pt would benefit from skilled physical therapy to address the listed deficits (see below) to increase independence with ADLs and function. Current recommendation is HHPT with intermittent assistance to return pt to PLOF.      Recommendations for follow up therapy are one component of a multi-disciplinary discharge planning process, led by the attending physician.  Recommendations may be updated based on patient status, additional functional criteria and insurance authorization.  Follow Up Recommendations  Home health PT     Assistance Recommended at Discharge Intermittent Supervision/Assistance  Patient can return home with the following A little help with walking and/or transfers;A little help with bathing/dressing/bathroom;Help with stairs or ramp for entrance;Assist for transportation;Assistance with cooking/housework   Equipment Recommendations  Rolling walker (2 wheels)    Recommendations for Other Services       Precautions / Restrictions Precautions Precautions: Fall Precaution Comments: No BP L UE Restrictions Weight Bearing Restrictions: No     Mobility  Bed Mobility Overal bed  mobility: Modified Independent Bed Mobility: Supine to Sit     Supine to sit: Modified independent (Device/Increase time)          Transfers Overall transfer level: Needs assistance Equipment used: Rolling walker (2 wheels) Transfers: Sit to/from Stand Sit to Stand: Min guard                Ambulation/Gait Ambulation/Gait assistance: Counsellor (Feet): 220 Feet Assistive device: Rolling walker (2 wheels) Gait Pattern/deviations: WFL(Within Functional Limits), Step-through pattern       General Gait Details: pt ambulated with one epsiode of unsteadiness that was self corrected and no LOB noted   Stairs             Wheelchair Mobility    Modified Rankin (Stroke Patients Only)       Balance Overall balance assessment: Needs assistance Sitting-balance support: No upper extremity supported, Feet supported Sitting balance-Leahy Scale: Good     Standing balance support: Bilateral upper extremity supported Standing balance-Leahy Scale: Fair                              Cognition Arousal/Alertness: Awake/alert Behavior During Therapy: WFL for tasks assessed/performed Overall Cognitive Status: Within Functional Limits for tasks assessed                                          Exercises      General Comments        Pertinent Vitals/Pain Pain Assessment Faces Pain Scale: Hurts a little  bit Pain Location: R low back Pain Descriptors / Indicators: Guarding, Grimacing Pain Intervention(s): Limited activity within patient's tolerance, Monitored during session    Home Living                          Prior Function            PT Goals (current goals can now be found in the care plan section) Acute Rehab PT Goals Patient Stated Goal: to go home PT Goal Formulation: With patient Time For Goal Achievement: 11/22/21 Potential to Achieve Goals: Good Progress towards PT goals: Progressing toward  goals    Frequency    Min 2X/week      PT Plan Current plan remains appropriate    Co-evaluation              AM-PAC PT "6 Clicks" Mobility   Outcome Measure  Help needed turning from your back to your side while in a flat bed without using bedrails?: A Little Help needed moving from lying on your back to sitting on the side of a flat bed without using bedrails?: A Little Help needed moving to and from a bed to a chair (including a wheelchair)?: A Little Help needed standing up from a chair using your arms (e.g., wheelchair or bedside chair)?: A Little Help needed to walk in hospital room?: A Little Help needed climbing 3-5 steps with a railing? : A Little 6 Click Score: 18    End of Session Equipment Utilized During Treatment: Gait belt Activity Tolerance: Patient tolerated treatment well Patient left: in bed;with bed alarm set;with call bell/phone within reach Nurse Communication: Mobility status PT Visit Diagnosis: Unsteadiness on feet (R26.81);Muscle weakness (generalized) (M62.81)     Time: 0973-5329 PT Time Calculation (min) (ACUTE ONLY): 11 min  Charges:  $Therapeutic Activity: 8-22 mins                     [employer], SPT 11/20/2021, 3:01 PM

## 2021-11-21 DIAGNOSIS — R579 Shock, unspecified: Secondary | ICD-10-CM | POA: Diagnosis not present

## 2021-11-21 LAB — CBC WITH DIFFERENTIAL/PLATELET
Abs Immature Granulocytes: 0.06 10*3/uL (ref 0.00–0.07)
Basophils Absolute: 0 10*3/uL (ref 0.0–0.1)
Basophils Relative: 1 %
Eosinophils Absolute: 0.2 10*3/uL (ref 0.0–0.5)
Eosinophils Relative: 2 %
HCT: 23.1 % — ABNORMAL LOW (ref 36.0–46.0)
Hemoglobin: 7.8 g/dL — ABNORMAL LOW (ref 12.0–15.0)
Immature Granulocytes: 1 %
Lymphocytes Relative: 14 %
Lymphs Abs: 1.1 10*3/uL (ref 0.7–4.0)
MCH: 28.1 pg (ref 26.0–34.0)
MCHC: 33.8 g/dL (ref 30.0–36.0)
MCV: 83.1 fL (ref 80.0–100.0)
Monocytes Absolute: 0.9 10*3/uL (ref 0.1–1.0)
Monocytes Relative: 12 %
Neutro Abs: 5.2 10*3/uL (ref 1.7–7.7)
Neutrophils Relative %: 70 %
Platelets: 231 10*3/uL (ref 150–400)
RBC: 2.78 MIL/uL — ABNORMAL LOW (ref 3.87–5.11)
RDW: 17 % — ABNORMAL HIGH (ref 11.5–15.5)
WBC: 7.3 10*3/uL (ref 4.0–10.5)
nRBC: 0 % (ref 0.0–0.2)

## 2021-11-21 LAB — URINALYSIS, ROUTINE W REFLEX MICROSCOPIC
RBC / HPF: >50 RBC/hpf — ABNORMAL HIGH (ref 0–5)
Specific Gravity, Urine: >1.03 — ABNORMAL HIGH (ref 1.005–1.030)
Squamous Epithelial / HPF: >50 — ABNORMAL HIGH (ref 0–5)
WBC, UA: >50 WBC/hpf — ABNORMAL HIGH (ref 0–5)

## 2021-11-21 LAB — RENAL FUNCTION PANEL
Albumin: 2.4 g/dL — ABNORMAL LOW (ref 3.5–5.0)
Anion gap: 10 (ref 5–15)
BUN: 41 mg/dL — ABNORMAL HIGH (ref 8–23)
CO2: 27 mmol/L (ref 22–32)
Calcium: 8.8 mg/dL — ABNORMAL LOW (ref 8.9–10.3)
Chloride: 100 mmol/L (ref 98–111)
Creatinine, Ser: 7.56 mg/dL — ABNORMAL HIGH (ref 0.44–1.00)
GFR, Estimated: 5 mL/min — ABNORMAL LOW (ref >60)
Glucose, Bld: 88 mg/dL (ref 70–99)
Phosphorus: 4.1 mg/dL (ref 2.5–4.6)
Potassium: 4.6 mmol/L (ref 3.5–5.1)
Sodium: 137 mmol/L (ref 135–145)

## 2021-11-21 LAB — MAGNESIUM: Magnesium: 2 mg/dL (ref 1.7–2.4)

## 2021-11-21 LAB — PHOSPHORUS: Phosphorus: 4.1 mg/dL (ref 2.5–4.6)

## 2021-11-21 MED ORDER — EPOETIN ALFA 10000 UNIT/ML IJ SOLN
INTRAMUSCULAR | Status: AC
  Administered 2021-11-21: 10000 [IU] via INTRAVENOUS
  Filled 2021-11-21: qty 1

## 2021-11-21 NOTE — Progress Notes (Signed)
Patient refused the bed alarm.  Patient was educated concerning safety and falls.  Patient still declined bed alarm.

## 2021-11-21 NOTE — Progress Notes (Signed)
Mobility Specialist - Progress Note   11/21/21 1315  Mobility  Activity Stood at bedside;Dangled on edge of bed;Ambulated independently in hallway  Level of Assistance Modified independent, requires aide device or extra time  Assistive Device Front wheel walker  Distance Ambulated (ft) 360 ft  Activity Response Tolerated well  Mobility Referral Yes  $Mobility charge 1 Mobility   Pt supine in bed on RA upon arrival. Pt completes bed mobility indep with extra time. Pt STS and ambulates in hallway indep and no noted LOB. Pt returns to bed with needs in reach.   Gretchen Short  Mobility Specialist  11/21/21 1:16 PM

## 2021-11-21 NOTE — Progress Notes (Signed)
[facility] [place] NAME: Teresa Cook    MR#:  [id number]  DATE OF BIRTH:  Jan 27, 1955  SUBJECTIVE:  no family at bedside. Patient seen earlier today. Per RN did not have urine for last three days. Patient sat little later on the commode and had 20 mL of dark urine. No other blood he discharge. Tolerating PO diet. intermittent pain in the abdomen. Tolerating PO diet. No fever.  VITALS:  Blood pressure (!) 141/72, pulse 67, temperature 98.3 F (36.8 C), temperature source Oral, resp. rate (!) 24, height '5\' 5"'$  (1.651 m), weight 68 kg, SpO2 93 %.  PHYSICAL EXAMINATION:   GENERAL:  66 y.o.-year-old patient lying in the bed with no acute distress.  LUNGS: Normal breath sounds bilaterally, no wheezing CARDIOVASCULAR: S1, S2 normal. No murmurs,   ABDOMEN: Soft, nontender, nondistended. Bowel sounds present.  EXTREMITIES: No  edema b/l.    NEUROLOGIC: nonfocal  patient is alert and awake SKIN: No obvious rash, lesion, or ulcer.   LABORATORY PANEL:  CBC Recent Labs  Lab 11/21/21 0441  WBC 7.3  HGB 7.8*  HCT 23.1*  PLT 231    Chemistries  Recent Labs  Lab 11/21/21 0441  NA 137  K 4.6  CL 100  CO2 27  GLUCOSE 88  BUN 41*  CREATININE 7.56*  CALCIUM 8.8*  MG 2.0   Assessment and Plan  66 y.o female with PMH significant for ESRD on HD, A. Fib & DVT on Warfarin , admitted on 10/30 with Hemorrhagic shock, requiring pressor support initially, due to right renal hemorrhage with large hematoma in setting of supratherapeutic INR (INR >9).  Vascular Surgery consulted, required coil embolization of the right renal artery.  By 11/3, able to be weaned off of pressor support.  Transferred to the hospitalist service by 11/4.  Challenged with IV heparin on 11/7 and transitioned to p.o. Eliquis on 11/8.  Hemoglobin has since remained stable.  Pain continues to be an issue.  CT scan done 11/11 withOUT worsening of hematomas.     Hemorrhagic shock  in the setting of right renal hemorrhage Supratherapeutic INR status post Coumadin reversal agent Acute blood loss anemia --Patient required coil embolization of right renal artery by vascular surgery.  Hemoglobin overall stable.  INR 1.5.  Shock resolved.  Neo-Synephrine discontinued.  --pt was restarted on  p.o. Eliquis and hemoglobin remained stable.  -- Repeat CT scan 11/17/21 shows stability of hematomas. oted episode of blood in vault.    Still reporting some hematuria-patient still having a lot of pain-trying scheduled pain medication plus as needed --11/15--d/w Dr Candiss Norse and Dr Sabino Snipes is to [facility] eliquis for now in the setting of anemia, hematuria and see how she does as out pt before resuming it--d/w dter [place] crisp   Ileus, resolved Patient had worsening of abdominal distention noted on 11/3.  KUB revealed gaseous distention consistent with ileus.  No signs of obstruction or free air under the diaphragm.   +BMs   Atrial tachycardia/PNSVT/atrial fibrillation with rapid ventricular response Rate control improved.  On p.o. amiodarone  --eliquis on HOLD --Followed by [facility] cardiology.   ESRD on HD Nephrology following.  On hemodialysis Monday/Wednesday/Friday.   Heart failure with preserved ejection fraction LVEF 50 to 86%, grade 1 diastolic dysfunction.  Volume is managed by dialysis.  Started on [facility] 11/9.  Hold Imdur for now, can consider restarting if blood pressure tolerates   GERD -PPI     DVT  prophylaxis: SCD/ambulation Code Status: DNR Disposition Plan: Status is: Inpatient Remains inpatient appropriate because: -pain control prior to d/c -Monitoring of hemoglobin   Discussed above all with dter victoria on phone TOTAL TIME TAKING CARE OF THIS PATIENT: 35 minutes.  >50% time spent on counselling and coordination of care  Note: This dictation was prepared with Dragon dictation along with smaller phrase technology. Any transcriptional errors  that result from this process are unintentional.  Fritzi Mandes M.D    [facility]   CC: Primary care physician; Center, [place]

## 2021-11-21 NOTE — Plan of Care (Signed)

## 2021-11-21 NOTE — Plan of Care (Signed)

## 2021-11-21 NOTE — Progress Notes (Addendum)
[facility]  ROUNDING NOTE   Subjective:   Teresa Cook is a 66 year old African-American female with past medical conditions including hypertension, gout, anxiety with depression, sickle cell trait, systolic heart failure with a EF 40 to 45%, breast cancer with bilateral mastectomy, seizures, and end-stage renal disease on hemodialysis.  Patient presents to the emergency department via EMS for multiple syncopal episodes.  Patient has been admitted for Right flank pain [R10.9] Elevated lactic acid level [R79.89] Hypotension [I95.9] Hypotension, unspecified hypotension type [I95.9] Altered mental status, unspecified altered mental status type [R41.82]  Patient is known to our practice and receives outpatient dialysis treatments at [facility] on a MWF schedule, supervised by Dr. Candiss Norse.    Patient seen resting in bed States abd pain improving Continues to complain of shortness of breath  Objective:  Vital signs in last 24 hours:  Temp:  [98.3 F (36.8 C)-98.6 F (37 C)] 98.6 F (37 C) (11/15 0803) Pulse Rate:  [72-77] 74 (11/15 0803) Resp:  [16-20] 16 (11/15 0803) BP: (118-138)/(70-85) 118/70 (11/15 0803) SpO2:  [89 %-97 %] 89 % (11/15 0803)  Weight change:  Filed Weights   11/19/21 0548 11/19/21 1027 11/20/21 0500  Weight: 68.4 kg 63.1 kg 68 kg    Intake/Output: No intake/output data recorded.   Intake/Output this shift:  No intake/output data recorded.  Physical Exam: General: NAD  Head: Normocephalic, atraumatic. Moist oral mucosal membranes  Eyes: Anicteric  Lungs:  Coarse left greater than right, normal effort, room air  Heart: Regular rate and rhythm  Abdomen:  Soft, tender, BS present  Extremities:  No peripheral edema.  Neurologic: Nonfocal, moving all four extremities  Skin: No lesions  Access: Lt Permcath    Basic Metabolic Panel: Recent Labs  Lab 11/16/21 0547 11/17/21 0455 11/18/21 0445 11/19/21 0435 11/20/21 0447  11/21/21 0441  NA 135  --   --  136  --  137  K 3.6  --   --  4.4  --  4.6  CL 100  --   --  98  --  100  CO2 27  --   --  28  --  27  GLUCOSE 86  --   --  83  --  88  BUN 37*  --   --  44*  --  41*  CREATININE 7.01*  --   --  8.59*  --  7.56*  CALCIUM 9.1  --   --  9.7  --  8.8*  MG 2.0 1.8 1.9 2.0 1.8 2.0  PHOS 4.2  4.4 3.4 4.0 4.5  4.5 4.0 4.1  4.1     Liver Function Tests: Recent Labs  Lab 11/16/21 0547 11/19/21 0435 11/21/21 0441  ALBUMIN 2.2* 2.5* 2.4*    No results for input(s): "LIPASE", "AMYLASE" in the last 168 hours. No results for input(s): "AMMONIA" in the last 168 hours.  CBC: Recent Labs  Lab 11/17/21 0455 11/18/21 0445 11/19/21 0435 11/20/21 0447 11/21/21 0441  WBC 8.1 7.5 8.5 6.5 7.3  NEUTROABS 5.8 5.5 6.2 4.5 5.2  HGB 7.8* 7.8* 8.8* 8.0* 7.8*  HCT 22.9* 23.1* 25.8* 24.0* 23.1*  MCV 81.2 81.9 81.1 82.8 83.1  PLT 260 268 326 244 231     Cardiac Enzymes: No results for input(s): "CKTOTAL", "CKMB", "CKMBINDEX", "TROPONINI" in the last 168 hours.  BNP: Invalid input(s): "POCBNP"  CBG: No results for input(s): "GLUCAP" in the last 168 hours.   Microbiology: Results for orders placed or performed during  the hospital encounter of 11/05/21  Blood Culture (routine x 2)     Status: None   Collection Time: 11/05/21  3:20 AM   Specimen: BLOOD  Result Value Ref Range Status   Specimen Description BLOOD RIGHT FA  Final   Special Requests   Final    BOTTLES DRAWN AEROBIC AND ANAEROBIC Blood Culture results may not be optimal due to an inadequate volume of blood received in culture bottles   Culture   Final    NO GROWTH 5 DAYS Performed at [facility], [address] [place], [place] [address]    Report Status 11/10/2021 FINAL  Final  SARS Coronavirus 2 by RT PCR (hospital order, performed in [facility] hospital lab) *cepheid single result test* Anterior Nasal Swab     Status: None   Collection Time: 11/05/21  3:20 AM   Specimen:  Anterior Nasal Swab  Result Value Ref Range Status   SARS Coronavirus 2 by RT PCR NEGATIVE NEGATIVE Final    Comment: (NOTE) SARS-CoV-2 target nucleic acids are NOT DETECTED.  The SARS-CoV-2 RNA is generally detectable in upper and lower respiratory specimens during the acute phase of infection. The lowest concentration of SARS-CoV-2 viral copies this assay can detect is 250 copies / mL. A negative result does not preclude SARS-CoV-2 infection and should not be used as the sole basis for treatment or other patient management decisions.  A negative result may occur with improper specimen collection / handling, submission of specimen other than nasopharyngeal swab, presence of viral mutation(s) within the areas targeted by this assay, and inadequate number of viral copies (<250 copies / mL). A negative result must be combined with clinical observations, patient history, and epidemiological information.  Fact Sheet for Patients:   https://www.patel.info/  Fact Sheet for Healthcare Providers: https://hall.com/  This test is not yet approved or  cleared by the [place] FDA and has been authorized for detection and/or diagnosis of SARS-CoV-2 by FDA under an Emergency Use Authorization (EUA).  This EUA will remain in effect (meaning this test can be used) for the duration of the COVID-19 declaration under Section 564(b)(1) of the Act, 21 U.S.C. section 360bbb-3(b)(1), unless the authorization is terminated or revoked sooner.  Performed at [facility], [address] [place], [place] [address]   Blood Culture (routine x 2)     Status: None   Collection Time: 11/05/21  3:21 AM   Specimen: BLOOD  Result Value Ref Range Status   Specimen Description BLOOD RIGHT HAND  Final   Special Requests   Final    BOTTLES DRAWN AEROBIC AND ANAEROBIC Blood Culture results may not be optimal due to an inadequate volume of blood received in  culture bottles   Culture   Final    NO GROWTH 5 DAYS Performed at [facility], [address], [place], [place] [address]    Report Status 11/10/2021 FINAL  Final  MRSA Next Gen by PCR, Nasal     Status: None   Collection Time: 11/05/21  6:57 AM   Specimen: Nasal Mucosa; Nasal Swab  Result Value Ref Range Status   MRSA by PCR Next Gen NOT DETECTED NOT DETECTED Final    Comment: (NOTE) The GeneXpert MRSA Assay (FDA approved for NASAL specimens only), is one component of a comprehensive MRSA colonization surveillance program. It is not intended to diagnose MRSA infection nor to guide or monitor treatment for MRSA infections. Test performance is not FDA approved in patients less  than 22 years old. Performed at [facility], [address] [place], [place] [address]     Coagulation Studies: No results for input(s): "LABPROT", "INR" in the last 72 hours.   Urinalysis: No results for input(s): "COLORURINE", "LABSPEC", "PHURINE", "GLUCOSEU", "HGBUR", "BILIRUBINUR", "KETONESUR", "PROTEINUR", "UROBILINOGEN", "NITRITE", "LEUKOCYTESUR" in the last 72 hours.  Invalid input(s): "APPERANCEUR"    Imaging: No results found.   Medications:    sodium chloride 250 mL (11/12/21 2214)   anticoagulant sodium citrate      amiodarone  200 mg Oral BID   apixaban  2.5 mg Oral BID   carvedilol  12.5 mg Oral BID WC   Chlorhexidine Gluconate Cloth  6 each Topical Q0600   cinacalcet  60 mg Oral QAC lunch   epoetin (EPOGEN/PROCRIT) injection  10,000 Units Intravenous Q M,W,F-HD   famotidine  20 mg Oral Daily   lidocaine  2 patch Transdermal Q24H   multivitamin  1 tablet Oral QHS   oxyCODONE  2.5 mg Oral Q6H   pantoprazole  40 mg Oral BID   sacubitril-valsartan  1 tablet Oral BID   sertraline  100 mg Oral Daily   sevelamer carbonate  2,400 mg Oral TID with meals   traZODone  50 mg Oral QHS   zolpidem  5 mg Oral QHS   acetaminophen, ALPRAZolam, alteplase, alum & mag  hydroxide-simeth, anticoagulant sodium citrate, bisacodyl, fluticasone, heparin, lidocaine (PF), lidocaine-prilocaine, loperamide, methocarbamol, metoprolol tartrate, ondansetron (ZOFRAN) IV, mouth rinse, oxyCODONE, pentafluoroprop-tetrafluoroeth, polyethylene glycol, senna-docusate, sodium chloride  Assessment/ Plan:  Ms. Lareta Bruneau is a 66 y.o.  female is a 66 year old African-American female with past medical conditions including hypertension, gout, anxiety with depression, sickle cell trait, systolic heart failure with a EF 40 to 45%, breast cancer with bilateral mastectomy, seizures, and end-stage renal disease on hemodialysis.  Patient presents to the emergency department via EMS for multiple syncopal episodes.  Patient has been admitted for Right flank pain [R10.9] Elevated lactic acid level [R79.89] Hypotension [I95.9] Hypotension, unspecified hypotension type [I95.9] Altered mental status, unspecified altered mental status type [R41.82]  [facility]/MWF/Lt Permcath  End-stage renal disease on hemodialysis.  Will maintain outpatient schedule if possible. Will receive dialysis later today.   2. Anemia of chronic kidney disease with acute blood loss Lab Results  Component Value Date   HGB 7.8 (L) 11/21/2021  CT angio abdomen pelvis shows severe hemorrhage at right kidney.  Appreciate vascular surgery performing right renal angiogram with coil embolization to manage hemorrhage.  Patient received 2 units FFP and blood transfusions thus far during admission. Hgb decreased slightly today. UA sent today positive for hematuria. Cardiology agrees with holding anticoagulant and following up at later date.    3. Secondary Hyperparathyroidism: with outpatient labs: PTH 379, phosphorus 5.7, calcium 9.4 on June 18, 2021.   Lab Results  Component Value Date   PTH 214 (H) 07/08/2021   CALCIUM 8.8 (L) 11/21/2021   PHOS 4.1 11/21/2021   PHOS 4.1 11/21/2021  Monitoring bone minerals  during this admission Continue sevelamer with all meals.  4.  Hypotension likely due to blood loss, Monitoring with recent reports of melena. Blood pressure acceptable for this patient.   5.  Hyperkalemia, Corrected with dialysis   LOS: [place] 11/15/202311:04 AM

## 2021-11-21 NOTE — Progress Notes (Signed)
   11/21/21 1630  Vitals  Temp 98.5 F (36.9 C)  Temp Source Oral  BP 126/71  MAP (mmHg) 88  BP Location Right Arm  BP Method Automatic  Patient Position (if appropriate) Lying  Pulse Rate 70  Pulse Rate Source Monitor  ECG Heart Rate 72  Resp (!) 21  Oxygen Therapy  SpO2 94 %  O2 Device Room Air  Patient Activity (if Appropriate) In bed  Pulse Oximetry Type Continuous  During Treatment Monitoring  Blood Flow Rate (mL/min) 200 mL/min  HD Safety Checks Performed Yes  Intra-Hemodialysis Comments Tx completed;Tolerated well  Dialysis Fluid Bolus Normal Saline  Bolus Amount (mL) 300 mL  Dialysate Change 2K;2.5 Ca  Post Treatment  Dialyzer Clearance Lightly streaked  Duration of HD Treatment -hour(s) 2.75 hour(s)  Hemodialysis Intake (mL) 0 mL  Liters Processed 54  Fluid Removed (mL) 1000 mL  Tolerated HD Treatment Yes  Post-Hemodialysis Comments hd tx completed. no complications.  Hemodialysis Catheter Left Internal jugular Permanent  Placement Date: (c)   Placed prior to admission: Yes  Orientation: Left  Access Location: Internal jugular  Hemodialysis Catheter Type: Permanent  Site Condition No complications  Blue Lumen Status Blood return noted  Red Lumen Status Blood return noted  Purple Lumen Status N/A  Catheter fill solution Heparin 1000 units/ml  Catheter fill volume (Arterial) 1.7 cc  Catheter fill volume (Venous) 1.7  Dressing Type Transparent  Dressing Status Antimicrobial disc in place;Clean, Dry, Intact  Interventions Other (Comment) (na)  Drainage Description None  Dressing Change Due 11/26/21  Post treatment catheter status Capped and Clamped

## 2021-11-22 ENCOUNTER — Inpatient Hospital Stay: Payer: Medicare Other

## 2021-11-22 DIAGNOSIS — R579 Shock, unspecified: Secondary | ICD-10-CM | POA: Diagnosis not present

## 2021-11-22 LAB — HEMOGLOBIN: Hemoglobin: 7.9 g/dL — ABNORMAL LOW (ref 12.0–15.0)

## 2021-11-22 LAB — PHOSPHORUS: Phosphorus: 2.9 mg/dL (ref 2.5–4.6)

## 2021-11-22 MED ORDER — AMIODARONE HCL 200 MG PO TABS: 200.0000 mg | ORAL_TABLET | Freq: Two times a day (BID) | ORAL | 1 refills | Status: AC

## 2021-11-22 MED ORDER — LIDOCAINE 5 % EX PTCH: 2.0000 | MEDICATED_PATCH | CUTANEOUS | 0 refills | Status: DC

## 2021-11-22 MED ORDER — ALPRAZOLAM 0.25 MG PO TABS: 0.2500 mg | ORAL_TABLET | Freq: Two times a day (BID) | ORAL | 0 refills | Status: DC | PRN

## 2021-11-22 MED ORDER — OXYCODONE HCL 5 MG PO TABS: 5.0000 mg | ORAL_TABLET | Freq: Four times a day (QID) | ORAL | 0 refills | Status: DC | PRN

## 2021-11-22 MED ORDER — POLYETHYLENE GLYCOL 3350 17 G PO PACK: 17.0000 g | PACK | Freq: Every day | ORAL | 0 refills | Status: DC | PRN

## 2021-11-22 NOTE — Discharge Instructions (Signed)
Resume your hemodialysis Monday Wednesday Friday as per your routine

## 2021-11-22 NOTE — Discharge Summary (Signed)
Physician Discharge Summary   Patient: Teresa Cook MRN: [id number] DOB: 04-10-1955  Admit date:     11/05/2021  Discharge date: 11/22/21  Discharge Physician: Fritzi Mandes   PCP: [facility] [place]   Recommendations at discharge:   resume your hemodialysis Monday Wednesday Friday on your routine  follow-up cardiology [facility] Dr. Gerald Stabs End in 1 to 2 weeks follow-up nephrology Dr. Candiss Norse in 1 to 2 weeks  Discharge Diagnoses: Principal Problem:   Shock [facility]) Active Problems:   Acute respiratory failure with hypoxia ([place])   ESRD on dialysis [facility])   End-stage renal disease on hemodialysis ([place])   HTN (hypertension)   PAF (paroxysmal atrial fibrillation) (H[facility])   Hypotension   Anemia due to acute blood loss   Right flank pain   Retroperitoneal bleed   Altered mental status   Elevated lactic acid level   Atrial fibrillation with rapid ventricular response [facility])   Hospital Course: 66 y.o female with PMH significant for ESRD on HD, A. Fib & DVT on Warfarin , admitted on 10/30 with Hemorrhagic shock, requiring pressor support initially, due to right renal hemorrhage with large hematoma in setting of supratherapeutic INR (INR >9).  Vascular Surgery consulted, required coil embolization of the right renal artery.  By 11/3, able to be weaned off of pressor support.  Transferred to the hospitalist service by 11/4.  Challenged with IV heparin on 11/7 and transitioned to p.o. Eliquis on 11/8.  Hemoglobin has since remained stable.  Pain continues to be an issue.  CT scan done 11/11 without worsening of hematoma.   Hemorrhagic shock in the setting of right renal hemorrhage Supratherapeutic INR status post Coumadin reversal agent Acute blood loss anemia/anemia of chronic disease --Patient required coil embolization of right renal artery by vascular surgery.  Hemoglobin overall stable.  INR 1.5.  Shock resolved.  Neo-Synephrine discontinued.  --pt was restarted on  p.o. Eliquis  and hemoglobin remained stable.  -- Repeat CT scan 11/17/21 shows stability of hematomas. Still reporting some hematuria-patient still having a lot of pain-trying scheduled pain medication plus as needed --11/15--d/w Dr Candiss Norse and Dr Sabino Snipes is to [facility] eliquis for now in the setting of anemia, hematuria and see how she does as out pt before resuming it--d/w dter [place] crisp --hgb stable at 7.9   Ileus, resolved Patient had worsening of abdominal distention noted on 11/3.  KUB revealed gaseous distention consistent with ileus.  No signs of obstruction or free air under the diaphragm.   +BMs   Atrial tachycardia/PNSVT/atrial fibrillation with rapid ventricular response Rate control improved.  On p.o. amiodarone  --eliquis on HOLD till further determination by Nephrology and cardiology as out pt --Followed by [facility] cardiology.   ESRD on HD Nephrology following.  On hemodialysis Monday/Wednesday/Friday.   Heart failure with preserved ejection fraction LVEF 50 to 27%, grade 1 diastolic dysfunction.  Volume is managed by dialysis.   -- on Entresto ,Coreg, hydralzine.     GERD -PPI  Anxiety --cont Zoloft and PRN xanax (rx for #15 pills)   patient ambulates in the hallways by herself with mobility therapist. Per social worker patient and family declined home health.   DVT prophylaxis: SCD/ambulation Code Status: DNR  Discharge plan discussed with dter today     Pain control - [facility] Controlled Substance Reporting System database was reviewed. and patient was instructed, not to drive, operate heavy machinery, perform activities at heights, swimming or participation in water activities or provide baby-sitting services while on  Pain, Sleep and Anxiety Medications; until their outpatient Physician has advised to do so again. Also recommended to not to take more than prescribed Pain, Sleep and Anxiety Medications.  Consultants: cardiology, nephrology Procedures  performed: s/p renal artery recoil embolization  Disposition: Home Diet recommendation:  Discharge Diet Orders (From admission, onward)     Start     Ordered   11/22/21 0000  Diet - low sodium heart healthy        11/22/21 1045           Renal diet DISCHARGE MEDICATION: Allergies as of 11/22/2021       Reactions   Gabapentin Other (See Comments)   Seizure   Adhesive [tape] Itching   Silk tape is ok to use.        Medication List     STOP taking these medications    albuterol 108 (90 Base) MCG/ACT inhaler Commonly known as: VENTOLIN HFA   aspirin-acetaminophen-caffeine 250-250-65 MG tablet Commonly known as: EXCEDRIN MIGRAINE   atorvastatin 40 MG tablet Commonly known as: LIPITOR   isosorbide mononitrate 30 MG 24 hr tablet Commonly known as: IMDUR   megestrol 400 MG/10ML suspension Commonly known as: MEGACE   PARoxetine 40 MG tablet Commonly known as: Paxil   warfarin 5 MG tablet Commonly known as: COUMADIN       TAKE these medications    ALPRAZolam 0.25 MG tablet Commonly known as: XANAX Take 1 tablet (0.25 mg total) by mouth 2 (two) times daily as needed for anxiety.   amiodarone 200 MG tablet Commonly known as: PACERONE Take 1 tablet (200 mg total) by mouth 2 (two) times daily.   b complex-vitamin c-folic acid 0.8 MG Tabs tablet Take 1 tablet by mouth daily.   calcium carbonate 500 MG chewable tablet Commonly known as: TUMS - dosed in mg elemental calcium Chew 1 tablet (200 mg of elemental calcium total) by mouth 2 (two) times daily as needed for indigestion or heartburn.   carvedilol 12.5 MG tablet Commonly known as: Coreg Take 1 tablet (12.5 mg total) by mouth 2 (two) times daily.   cinacalcet 60 MG tablet Commonly known as: SENSIPAR Take 60 mg by mouth daily before lunch.   Entresto 24-26 MG Generic drug: sacubitril-valsartan Take 1 tablet by mouth 2 (two) times daily.   hydrALAZINE 25 MG tablet Commonly known as:  APRESOLINE Take 1 tablet (25 mg total) by mouth 3 (three) times daily.   lidocaine 5 % Commonly known as: LIDODERM Place 2 patches onto the skin daily. Remove & Discard patch within 12 hours or as directed by MD   oxyCODONE 5 MG immediate release tablet Commonly known as: Oxy IR/ROXICODONE Take 1 tablet (5 mg total) by mouth every 6 (six) hours as needed for moderate pain.   pantoprazole 40 MG tablet Commonly known as: PROTONIX Take 1 tablet (40 mg total) by mouth 2 (two) times daily.   polyethylene glycol 17 g packet Commonly known as: MIRALAX / GLYCOLAX Take 17 g by mouth daily as needed for mild constipation.   sertraline 100 MG tablet Commonly known as: ZOLOFT Take 100 mg by mouth daily.   sevelamer carbonate 800 MG tablet Commonly known as: RENVELA Take 3 tablets (2,400 mg total) by mouth with breakfast, with lunch, and with evening meal. One with snacks   traZODone 50 MG tablet Commonly known as: DESYREL Take 50 mg by mouth at bedtime.   zolpidem 10 MG tablet Commonly known as: AMBIEN Take 0.5 tablets (5 mg  total) by mouth at bedtime.               Durable Medical Equipment  (From admission, onward)           Start     Ordered   11/12/21 1717  For home use only DME Walker rolling  Once       Question Answer Comment  Walker: With Ceiba Wheels   Patient needs a walker to treat with the following condition General weakness      11/12/21 1716              Discharge Care Instructions  (From admission, onward)           Start     Ordered   11/22/21 0000  Discharge wound care:       Comments: Pressure Injury 09/10/16 Stage II -  Partial thickness loss of dermis presenting as a shallow open ulcer with a red, pink wound bed without slough. 1898 days  Foam padding as needed   11/22/21 [place], [facility]. Schedule an appointment as soon as possible for a visit in 1 week(s).    Specialty: General Practice Why: Hospital follow-up Contact information: [place]. [place] [address] [phone]         Nelva Bush, MD Follow up.   Specialty: Cardiology Why: afib Contact information: [place] [address] [phone]                Discharge Exam: Danley Danker Weights   11/21/21 1633 11/21/21 1640 11/22/21 0411  Weight: 67.2 kg 67 kg 65 kg     Condition at discharge: fair  The results of significant diagnostics from this hospitalization (including imaging, microbiology, ancillary and laboratory) are listed below for reference.   Imaging Studies: DG Chest Port 1 View  Result Date: 11/22/2021 CLINICAL DATA:  Shortness of breath EXAM: PORTABLE CHEST 1 VIEW COMPARISON:  Previous studies including the examination of 11/05/2021 FINDINGS: Cardiac size is within normal limits. Tip of left IJ dialysis catheter is seen at the junction of superior vena cava and right atrium. There are new linear densities in left mid and left lower lung fields. Increased interstitial markings are seen in right parahilar region and right lower lung field. There are no signs of alveolar pulmonary edema. There is no focal consolidation. There is minimal blunting of right lateral CP angle. There is no pneumothorax. IMPRESSION: There are new linear densities and increased interstitial markings in both parahilar regions and both lower lung fields suggesting subsegmental atelectasis and interstitial pneumonia. There are no signs of alveolar pulmonary edema. Minimal right pleural effusion. Electronically Signed   By: Elmer Picker M.D.   On: 11/22/2021 09:37   CT ABDOMEN PELVIS WO CONTRAST  Result Date: 11/17/2021 CLINICAL DATA:  Abdominal pain.  Right retroperitoneal hematoma. EXAM: CT ABDOMEN AND PELVIS WITHOUT CONTRAST TECHNIQUE: Multidetector CT imaging of the abdomen and pelvis was performed following the standard protocol  without IV contrast. RADIATION DOSE REDUCTION: This exam was performed according to the departmental dose-optimization program which includes automated exposure control, adjustment of the mA and/or kV according to patient size and/or use of iterative reconstruction technique. COMPARISON:  11/06/2021 FINDINGS: Lower chest: Small bilateral pleural effusions with overlying compressive type atelectasis. Hepatobiliary: No focal liver abnormality is seen. No gallstones, gallbladder wall thickening, or biliary  dilatation. Vicarious excretion of contrast material into the gallbladder is noted. Pancreas: Unremarkable. No pancreatic ductal dilatation or surrounding inflammatory changes. Spleen: Normal in size without focal abnormality. Adrenals/Urinary Tract: Normal adrenal glands. Bilateral atrophic in stage cystic kidneys are identified. The right kidney subcapsular hematoma measures 6.4 x 3.1 by 10.0 cm (volume = 100 cm^3). On the previous exam 6.9 x 3.5 by 10.6 cm (volume = 130 cm^3). Again seen are signs of right renal artery coil embolization. Urinary bladder appears decompressed. Stomach/Bowel: Stomach appears normal. No bowel wall thickening, inflammation, or distension. Vascular/Lymphatic: Extensive aortic atherosclerotic disease. No signs of aneurysm. No abdominopelvic adenopathy. Reproductive: Status post hysterectomy. No adnexal masses. Other: Large right retroperitoneal hematoma is again noted. This is decreased in density when compared with the previous exam compatible with subacute hematoma. On today's study this measures 14.7 x 5.1 by 25.2 cm, image 88/6 and image 48/2. On the previous exam this measured 14.7 x 4.6 by 26.2 cm. Decrease in volume and density hemoperitoneum within the dependent portion of the pelvis. Musculoskeletal: No acute or suspicious osseous findings. Stigmata of renal osteodystrophy is again noted. IMPRESSION: 1. Similar size of large right retroperitoneal hematoma which is decreased in  density when compared with 11/05/2021 compatible with subacute hematoma. 2. Subcapsular hematoma overlying the right kidney is decreased in size from prior exam. 3. Resolving hemoperitoneum is decreased in volume and density compared with the previous exam. 4. Similar, small bilateral pleural effusions with overlying compressive type atelectasis. 5.  Aortic Atherosclerosis (ICD10-I70.0). Electronically Signed   By: Kerby Moors M.D.   On: 11/17/2021 18:31   DG Abd 1 View  Result Date: 11/09/2021 CLINICAL DATA:  Nausea and vomit. EXAM: ABDOMEN - 1 VIEW COMPARISON:  CT 11/06/2021 FINDINGS: New gaseous gastric distension. There is air throughout prominent small and large bowel. Nonobstructive pattern. No evidence of free air. Coils in the left upper quadrant again seen. Diminishing gallbladder distension, high-density gallbladder contents again seen. Left femoral catheter in place. IMPRESSION: New gaseous gastric distension with air throughout prominent small and large bowel. Overall findings suggestive of generalized ileus. Electronically Signed   By: Keith Rake M.D.   On: 11/09/2021 17:22   US Abdomen Limited RUQ (LIVER/GB)  Result Date: 11/08/2021 CLINICAL DATA:  Abdomen pain EXAM: ULTRASOUND ABDOMEN LIMITED RIGHT UPPER QUADRANT COMPARISON:  CT 11/06/2021, 11/05/2021 FINDINGS: Gallbladder: No gallstones or wall thickening visualized. No sonographic Murphy sign noted by sonographer. Common bile duct: Diameter: 3.1 mm Liver: No focal lesion identified. Within normal limits in parenchymal echogenicity. Portal vein is patent on color Doppler imaging with normal direction of blood flow towards the liver. Other: Small free fluid adjacent to the inferior margin of right liver. Heterogenous complex fluid collection in the right flank measuring 11.2 x 6.6 x 13.2 cm, corresponding to known retroperitoneal hematoma. Heterogenous appearing echogenic right kidney, likely due to chronic kidney disease and hematoma  previously demonstrated on CT. IMPRESSION: 1. Negative for gallstones or biliary dilatation. 2. Large heterogenous complex fluid collection in the right flank measuring up to 13.2 cm, felt to correspond to CT demonstrated retroperitoneal hematoma. 3. Small free fluid adjacent to the inferior margin of right liver. 4. Heterogenous appearing right kidney, likely due to chronic kidney disease and hematoma previously demonstrated on CT. Electronically Signed   By: Donavan Foil M.D.   On: 11/08/2021 16:25   ECHOCARDIOGRAM COMPLETE  Result Date: 11/07/2021    ECHOCARDIOGRAM REPORT   Patient Name:   MADDIX KLIEWER Date of Exam: 11/06/2021  Medical Rec #:  [id number]       Height:       65.0 in Accession #:    [id number]      Weight:       127.6 lb Date of Birth:  December 23, 1955       BSA:          1.634 m Patient Age:    67 years        BP:           109/66 mmHg Patient Gender: F               HR:           96 bpm. Exam Location:  [facility] Procedure: 2D Echo, Color Doppler and Cardiac Doppler Indications:     Syncope R55  History:         Patient has prior history of Echocardiogram examinations, most                  recent 07/29/2021. CHF; Risk Factors:Hypertension. CKD, dialysis                  patient.  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 IMPRESSIONS  1. Left ventricular ejection fraction, by estimation, is 50 to 55%. The left ventricle has low normal function. The left ventricle has no regional wall motion abnormalities. There is mild concentric left ventricular hypertrophy. Left ventricular diastolic parameters are consistent with Grade I diastolic dysfunction (impaired relaxation).  2. Right ventricular systolic function is normal. The right ventricular size is normal.  3. The mitral valve is normal in structure. Trivial mitral valve regurgitation.  4. The aortic valve is calcified.  Aortic valve regurgitation is not visualized. Aortic valve sclerosis/calcification is present, without any evidence of aortic stenosis. FINDINGS  Left Ventricle: Left ventricular ejection fraction, by estimation, is 50 to 55%. The left ventricle has low normal function. The left ventricle has no regional wall motion abnormalities. The left ventricular internal cavity size was normal in size. There is mild concentric left ventricular hypertrophy. Left ventricular diastolic parameters are consistent with Grade I diastolic dysfunction (impaired relaxation). Right Ventricle: The right ventricular size is normal. No increase in right ventricular wall thickness. Right ventricular systolic function is normal. Left Atrium: Left atrial size was normal in size. Right Atrium: Right atrial size was normal in size. Pericardium: There is no evidence of pericardial effusion. Mitral Valve: The mitral valve is normal in structure. Trivial mitral valve regurgitation. Tricuspid Valve: The tricuspid valve is normal in structure. Tricuspid valve regurgitation is trivial. Aortic Valve: The aortic valve is calcified. Aortic valve regurgitation is not visualized. Aortic valve sclerosis/calcification is present, without any evidence of aortic stenosis. Aortic valve mean gradient measures 5.3 mmHg. Aortic valve peak gradient measures 10.2 mmHg. Aortic valve area, by VTI measures 1.93 cm. Pulmonic Valve: The pulmonic valve was normal in structure. Pulmonic valve regurgitation is not visualized. Aorta: The ascending aorta was not well visualized. IAS/Shunts: No atrial level shunt detected by color flow Doppler.  LEFT VENTRICLE PLAX 2D LVIDd:         3.80 cm   Diastology LVIDs:         2.80 cm   LV e' medial:    7.83 cm/s LV PW:         1.20 cm   LV E/e' medial:  13.7 LV IVS:        1.30 cm   LV e' lateral:   8.81 cm/s LVOT diam:     2.00 cm   LV E/e' lateral: 12.1 LV SV:         52 LV SV Index:   32 LVOT Area:     3.14 cm  RIGHT VENTRICLE RV  Basal diam:  2.60 cm RV Mid diam:    2.60 cm RV S prime:     14.10 cm/s LEFT ATRIUM             Index        RIGHT ATRIUM           Index LA diam:        3.80 cm 2.32 cm/m   RA Area:     10.20 cm LA Vol (A2C):   33.7 ml 20.62 ml/m  RA Volume:   16.90 ml  10.34 ml/m LA Vol (A4C):   28.9 ml 17.68 ml/m LA Biplane Vol: 31.4 ml 19.21 ml/m  AORTIC VALVE AV Area (Vmax):    1.94 cm AV Area (Vmean):   2.02 cm AV Area (VTI):     1.93 cm AV Vmax:           159.33 cm/s AV Vmean:          107.000 cm/s AV VTI:            0.267 m AV Peak Grad:      10.2 mmHg AV Mean Grad:      5.3 mmHg LVOT Vmax:         98.50 cm/s LVOT Vmean:        68.700 cm/s LVOT VTI:          0.164 m LVOT/AV VTI ratio: 0.62  AORTA Ao Root diam: 3.30 cm MITRAL VALVE                TRICUSPID VALVE MV Area (PHT): 4.29 cm     TR Peak grad:   31.8 mmHg MV Decel Time: 177 msec     TR Vmax:        282.00 cm/s MV E velocity: 107.00 cm/s MV A velocity: 104.00 cm/s  SHUNTS MV E/A ratio:  1.03         Systemic VTI:  0.16 m                             Systemic Diam: 2.00 cm Yolonda Kida MD Electronically signed by Yolonda Kida MD Signature Date/Time: 11/07/2021/6:49:47 AM    Final    US Carotid Bilateral  Result Date: 11/06/2021 CLINICAL DATA:  66 year old female with history of syncope. EXAM: BILATERAL CAROTID DUPLEX ULTRASOUND TECHNIQUE: Pearline Cables scale imaging, color Doppler and duplex ultrasound were performed of bilateral carotid and vertebral arteries in the neck. COMPARISON:  None Available. FINDINGS: Criteria: Quantification of carotid stenosis is based on velocity parameters that correlate the residual internal carotid diameter with NASCET-based stenosis levels, using the diameter of the distal internal carotid lumen as the denominator for stenosis measurement. The following velocity measurements were obtained: RIGHT ICA: Peak systolic velocity 852 cm/sec, End diastolic velocity 45 cm/sec CCA: Peak systolic velocity 778 cm/sec SYSTOLIC ICA/CCA  RATIO:  1.2 ECA: Peak systolic velocity 72 cm/sec LEFT ICA: Peak systolic velocity 242 cm/sec, End diastolic velocity 57 cm/sec CCA: 353 cm/sec SYSTOLIC ICA/CCA RATIO:  1.3 ECA: 75 cm/sec RIGHT CAROTID ARTERY: Mild  multifocal atherosclerotic plaque formation. No significant tortuosity. Normal low resistance waveforms. RIGHT VERTEBRAL ARTERY:  Antegrade flow. LEFT CAROTID ARTERY: Mild multifocal atherosclerotic plaque formation. No significant tortuosity. Normal low resistance waveforms. LEFT VERTEBRAL ARTERY:  Antegrade flow. Upper extremity non-invasive blood pressures: Not obtained. IMPRESSION: 1. Right carotid artery system: Less than 50% stenosis secondary to mild multifocal atherosclerotic plaque formation. 2. Left carotid artery system: Less than 50% stenosis secondary to mild multifocal atherosclerotic plaque formation. 3.  Vertebral artery system: Patent with antegrade flow bilaterally. Ruthann Cancer, MD [facility] Electronically Signed   By: Ruthann Cancer M.D.   On: 11/06/2021 14:27   CT ABDOMEN PELVIS WO CONTRAST  Result Date: 11/06/2021 CLINICAL DATA:  Retroperitoneal hematoma, follow up EXAM: CT ABDOMEN AND PELVIS WITHOUT CONTRAST TECHNIQUE: Multidetector CT imaging of the abdomen and pelvis was performed following the standard protocol without IV contrast. RADIATION DOSE REDUCTION: This exam was performed according to the departmental dose-optimization program which includes automated exposure control, adjustment of the mA and/or kV according to patient size and/or use of iterative reconstruction technique. COMPARISON:  CTA 11/05/2021. FINDINGS: Lower chest: Small bilateral pleural effusions with adjacent atelectasis, slightly increased from prior exam. Partially visualized ascending aortic aneurysm, see recent CTA of the chest 11/05/2021. Hepatobiliary: No focal liver abnormality is seen. The gallbladder is distended with vicarious  excretion of contrast. Pancreas: No ductal dilation or peripancreatic inflammatory change. Spleen: Normal in size without focal abnormality. Adrenals/Urinary Tract: Adrenal glands are unremarkable. Acquired cystic renal disease of dialysis. There is a right renal and pararenal retroperitoneal hematoma, which appears more hyperdense in areas in comparison to recent CT, likely reflecting developing clot. There is intraperitoneal hemorrhage as well with blood products in the pericolic gutters, perihepatic, perisplenic spaces, as well as in the pelvis. Overall volume of abdominopelvic hemoperitoneum similar to recent CT. Stomach/Bowel: The stomach is within normal limits. There is no evidence of bowel obstruction.Prior appendectomy, per medical record. Vascular/Lymphatic: Aorta iliac atherosclerosis. Interval coil embolization of the right renal vasculature. Reproductive: Prior hysterectomy. Other: There is a pressure dressing applied to the right groin. There is a left femoral angio catheter with tip terminating a left common iliac vein. Musculoskeletal: No acute osseous abnormality. No suspicious osseous lesion. Renal osteodystrophy. IMPRESSION: Evolving right-sided retroperitoneal hematoma, with overall similar volume of hemoperitoneum in comparison to recent CT. Interval right renal vascular coil embolization. Small bilateral pleural effusions with adjacent atelectasis, slightly increased from prior exam. 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 Electronically Signed   By: Maurine Simmering M.D.   On: 11/06/2021 13:47   DG Abd 1 View  Result Date: 11/06/2021 CLINICAL DATA:  Abdominal pain EXAM: ABDOMEN - 1 VIEW COMPARISON:  11/05/2021 FINDINGS: Left femoral catheter is identified with tip projecting over the lower abdomen just below the level of the aortic bifurcation. Embolization coils are noted within the right upper abdomen. The bowel gas pattern appears nonobstructed. There are a few  prominent air-filled loops of bowel noted in the right lower quadrant of the abdomen. Gas and stool noted within the colon up to the level of the rectum. IMPRESSION: 1. Nonobstructive bowel gas pattern. 2. Left femoral catheter tip projects over the lower abdomen just below the level of the aortic bifurcation. Electronically Signed   By: Kerby Moors M.D.   On: 11/06/2021 10:44   PERIPHERAL VASCULAR CATHETERIZATION  Result Date: 11/05/2021 See surgical note for result.  CT Angio Chest/Abd/Pel for Dissection W and/or W/WO  Result Date: 11/05/2021 CLINICAL DATA:  66 year old female with recurrent syncope, falls. Dialysis patient on warfarin. EXAM: CT ANGIOGRAPHY CHEST, ABDOMEN AND PELVIS TECHNIQUE: Multidetector CT imaging through the chest, abdomen and pelvis was performed using the standard protocol during bolus administration of intravenous contrast. Multiplanar reconstructed images and MIPs were obtained and reviewed to evaluate the vascular anatomy. RADIATION DOSE REDUCTION: This exam was performed according to the departmental dose-optimization program which includes automated exposure control, adjustment of the mA and/or kV according to patient size and/or use of iterative reconstruction technique. CONTRAST:  113m OMNIPAQUE IOHEXOL 350 MG/ML SOLN COMPARISON:  CTA chest 01/15/2016. CT Abdomen and Pelvis 10/25/2020. FINDINGS: CTA CHEST FINDINGS Cardiovascular: Calcified aortic atherosclerosis. Similar aortic and pulmonary artery contrast timing. Negative for thoracic aortic dissection. Fusiform enlargement of the ascending thoracic aorta up to 42 mm diameter. No pulmonary artery filling defect identified. Calcified coronary artery atherosclerosis. No cardiomegaly. Small simple fluid density anterior pericardial effusion, might be physiologic on series 4, image 61. Mediastinum/Nodes: No mediastinal mass or lymphadenopathy. Lungs/Pleura: Major airways are patent. Lung volumes are stable since 2018. No  pneumothorax or consolidation. Some chronic lung scarring. Small layering low-density right pleural effusion, favor transudate. Musculoskeletal: Chronic thoracic kyphoscoliosis. Chronically abnormal bone mineralization compatible with renal osteodystrophy. Thoracic vertebrae appear stable and intact. No sternal fracture identified. Visible shoulder osseous structures appear intact. No acute rib fracture identified. Review of the MIP images confirms the above findings. CTA ABDOMEN AND PELVIS FINDINGS VASCULAR Extensive Aortoiliac calcified atherosclerosis. Negative for abdominal aortic aneurysm or dissection. Major arterial structures in the abdomen and pelvis remain patent. Review of the MIP images confirms the above findings. NON-VASCULAR Hepatobiliary: Liver and gallbladder appear to remain within normal limits, but there is hemoperitoneum around the liver and in the right retroperitoneum adjacent. Pancreas: Within normal limits. Spleen: Appears to remain normal but there is perisplenic complex fluid/blood. 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 Adrenal glands and left kidney appear stable since 2022. But there is a large acute renal hemorrhage with bulky hematoma in the right pararenal space communicating with the right retroperitoneal and peritoneal spaces. Active contrast extravasation identified on series 13 delayed images in the region of the right lower pole (series 13, images 67 and 69, possibly also the anterior renal mid to lower Pole on image 49. large volume hemoperitoneum and abdominal hematoma. Dominant component of hemorrhage in the right abdomen is at least 600 mL. Additional pelvic hemoperitoneum. Urinary bladder is contracted and compressed. Chronic dystrophic calcifications in the pelvis. Stomach/Bowel: Decompressed large and small bowel throughout the abdomen and pelvis, with mass effect on the right peritoneal structures from abnormal renal and pararenal  spaces. Relatively decompressed stomach and duodenum. No free air. Lymphatic: No lymphadenopathy identified. Reproductive: Chronically absent uterus. Diminutive or obscured ovaries. Other: Moderate to large volume hemoperitoneum in the pelvis. Musculoskeletal: Chronic heterogeneous bone mineralization compatible with renal osteodystrophy. Lumbar vertebrae appear stable and intact. Pelvis, SI joints and proximal femurs appear stable and intact. Review of the MIP images confirms the above findings. IMPRESSION: 1. Positive for Severe Acute Right Renal hemorrhage. Underlying chronic renal disease of dialysis with Active Renal Vessel Contrast Extravasation visible on delayed series 13, and large volume retroperitoneal/intraperitoneal hemorrhage of > 600 mL. This Critical Value/emergent result called by telephone at the time of interpretation on 11/05/2021 at 6:49 am to Dr. CHinda Kehr, who verbally acknowledged these results. 2. No aortic dissection (but see #3) or other significant acute finding; small right pleural and pericardial  effusions appear to be transudate and are of doubtful significance. 3. 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 This recommendation follows 2010 ACCF/AHA/AATS/ACR/ASA/SCA/SCAI/SIR/STS/SVM Guidelines for the Diagnosis and Management of Patients with Thoracic Aortic Disease. Circulation. 2010; 121: Z660-Y301. Aortic aneurysm NOS (ICD10-I71.9). 4. Chronic renal osteodystrophy, thoracic kyphoscoliosis. Electronically Signed   By: Genevie Ann M.D.   On: 11/05/2021 06:54   CT Cervical Spine Wo Contrast  Result Date: 11/05/2021 CLINICAL DATA:  66 year old female with recurrent syncope, falls. Might have struck head. Chronic tentorial meningioma. Dialysis patient. EXAM: CT CERVICAL SPINE WITHOUT CONTRAST TECHNIQUE: Multidetector CT imaging of the cervical spine was performed without intravenous  contrast. Multiplanar CT image reconstructions were also generated. RADIATION DOSE REDUCTION: This exam was performed according to the departmental dose-optimization program which includes automated exposure control, adjustment of the mA and/or kV according to patient size and/or use of iterative reconstruction technique. COMPARISON:  Head CT today reported separately.  Neck CT 05/18/2013. FINDINGS: Alignment: Normal cervical lordosis. Cervicothoracic junction alignment is within normal limits. Bilateral posterior element alignment is within normal limits. Skull base and vertebrae: Visualized skull base is intact. No atlanto-occipital dissociation. C1 and C2 appear intact and aligned. No acute osseous abnormality identified. Soft tissues and spinal canal: No prevertebral fluid or swelling. No visible canal hematoma. Negative visible noncontrast neck soft tissues except for calcified carotid atherosclerosis and left IJ approach dual lumen dialysis type catheter. There is trace intravenous gas at the thoracic inlet, likely related to recent IV access. Disc levels:  Mild for age cervical spine degeneration. Upper chest: Calcified aortic atherosclerosis. Partially visible upper thoracic scoliosis. Negative lung apices. IMPRESSION: 1. No acute traumatic injury identified in the cervical spine. Mild for age cervical spine degeneration. 2. Aortic Atherosclerosis (ICD10-I70.0). Left-side dialysis catheter. Electronically Signed   By: Genevie Ann M.D.   On: 11/05/2021 04:38   CT Head Wo Contrast  Result Date: 11/05/2021 CLINICAL DATA:  66 year old female with recurrent syncope, falls. Might have struck head. Chronic tentorial meningioma. EXAM: CT HEAD WITHOUT CONTRAST TECHNIQUE: Contiguous axial images were obtained from the base of the skull through the vertex without intravenous contrast. RADIATION DOSE REDUCTION: This exam was performed according to the departmental dose-optimization program which includes automated  exposure control, adjustment of the mA and/or kV according to patient size and/or use of iterative reconstruction technique. COMPARISON:  Brain MRI 01/01/2016.  Head CT 07/08/2021. FINDINGS: Brain: Scattered chronic somewhat unusual dural calcifications including adjacent to both anterior clinoid processes. Partially calcified chronic lobulated midline posterior fossa meningioma at the tentorial incisor a measures 24 x 27 x 28 mm and appears 2 mm larger since July. This meningioma was up to 17 mm in 2017. But regional mass effect remains mild and there is no associated cerebral or cerebellar edema. Basilar cisterns remain normal. No superimposed No midline shift, ventriculomegaly, intracranial hemorrhage or evidence of cortically based acute infarction. Gray-white matter differentiation is within normal limits throughout the brain. Vascular: Calcified atherosclerosis at the skull base. No suspicious intracranial vascular hyperdensity. Skull: Stable and intact. Sinuses/Orbits: Visualized paranasal sinuses and mastoids are clear. Other: Broad-based right anterior convexity scalp hematoma measures up to 10 mm in thickness. Superimposed scalp vessel calcified atherosclerosis. No scalp soft tissue. Underlying right frontal bone appears stable and intact. Visualized orbit soft tissues are within normal limits. IMPRESSION: 1. Broad-based right anterior convexity scalp hematoma without underlying skull fracture. 2.  No acute traumatic injury identified to  the brain 3. But slowly enlarging chronic midline posterior fossa Meningioma, now up to 28 mm long axis versus 17 mm in 2017. No associated edema or significant mass effect. Recommend follow-up with Neurosurgery. Electronically Signed   By: Genevie Ann M.D.   On: 11/05/2021 04:34   DG Chest Port 1 View  Result Date: 11/05/2021 CLINICAL DATA:  Sepsis EXAM: PORTABLE CHEST 1 VIEW COMPARISON:  07/27/2021 FINDINGS: Lungs are clear. No pneumothorax or pleural effusion. Left  internal jugular tunnel hemodialysis catheter is seen with its tip within the right atrium. Cardiac size within normal limits. Pulmonary vascularity is normal. No acute bone abnormality. IMPRESSION: 1. No active disease. Electronically Signed   By: Fidela Salisbury M.D.   On: 11/05/2021 04:10    Microbiology: Results for orders placed or performed during the hospital encounter of 11/05/21  Blood Culture (routine x 2)     Status: None   Collection Time: 11/05/21  3:20 AM   Specimen: BLOOD  Result Value Ref Range Status   Specimen Description BLOOD RIGHT FA  Final   Special Requests   Final    BOTTLES DRAWN AEROBIC AND ANAEROBIC Blood Culture results may not be optimal due to an inadequate volume of blood received in culture bottles   Culture   Final    NO GROWTH 5 DAYS Performed at [facility], [address] [place], [place] [address]    Report Status 11/10/2021 FINAL  Final  SARS Coronavirus 2 by RT PCR (hospital order, performed in [facility] hospital lab) *cepheid single result test* Anterior Nasal Swab     Status: None   Collection Time: 11/05/21  3:20 AM   Specimen: Anterior Nasal Swab  Result Value Ref Range Status   SARS Coronavirus 2 by RT PCR NEGATIVE NEGATIVE Final    Comment: (NOTE) SARS-CoV-2 target nucleic acids are NOT DETECTED.  The SARS-CoV-2 RNA is generally detectable in upper and lower respiratory specimens during the acute phase of infection. The lowest concentration of SARS-CoV-2 viral copies this assay can detect is 250 copies / mL. A negative result does not preclude SARS-CoV-2 infection and should not be used as the sole basis for treatment or other patient management decisions.  A negative result may occur with improper specimen collection / handling, submission of specimen other than nasopharyngeal swab, presence of viral mutation(s) within the areas targeted by this assay, and inadequate number of viral copies (<250 copies / mL). A negative  result must be combined with clinical observations, patient history, and epidemiological information.  Fact Sheet for Patients:   https://www.Adhrit Krenz.info/  Fact Sheet for Healthcare Providers: https://hall.com/  This test is not yet approved or  cleared by the [place] FDA and has been authorized for detection and/or diagnosis of SARS-CoV-2 by FDA under an Emergency Use Authorization (EUA).  This EUA will remain in effect (meaning this test can be used) for the duration of the COVID-19 declaration under Section 564(b)(1) of the Act, 21 U.S.C. section 360bbb-3(b)(1), unless the authorization is terminated or revoked sooner.  Performed at [facility], [address] [place], [place] [address]   Blood Culture (routine x 2)     Status: None   Collection Time: 11/05/21  3:21 AM   Specimen: BLOOD  Result Value Ref Range Status   Specimen Description BLOOD RIGHT HAND  Final   Special Requests   Final    BOTTLES DRAWN AEROBIC AND ANAEROBIC Blood Culture results may not be optimal due to an inadequate  volume of blood received in culture bottles   Culture   Final    NO GROWTH 5 DAYS Performed at [facility], [address] [place], [place] [address]    Report Status 11/10/2021 FINAL  Final  MRSA Next Gen by PCR, Nasal     Status: None   Collection Time: 11/05/21  6:57 AM   Specimen: Nasal Mucosa; Nasal Swab  Result Value Ref Range Status   MRSA by PCR Next Gen NOT DETECTED NOT DETECTED Final    Comment: (NOTE) The GeneXpert MRSA Assay (FDA approved for NASAL specimens only), is one component of a comprehensive MRSA colonization surveillance program. It is not intended to diagnose MRSA infection nor to guide or monitor treatment for MRSA infections. Test performance is not FDA approved in patients less than 35 years old. Performed at [facility], [address] [place], [place] [address]      Labs: CBC: Recent Labs  Lab 11/17/21 0455 11/18/21 0445 11/19/21 0435 11/20/21 0447 11/21/21 0441 11/22/21 0512  WBC 8.1 7.5 8.5 6.5 7.3  --   NEUTROABS 5.8 5.5 6.2 4.5 5.2  --   HGB 7.8* 7.8* 8.8* 8.0* 7.8* 7.9*  HCT 22.9* 23.1* 25.8* 24.0* 23.1*  --   MCV 81.2 81.9 81.1 82.8 83.1  --   PLT 260 268 326 244 231  --    Basic Metabolic Panel: Recent Labs  Lab 11/16/21 0547 11/17/21 0455 11/18/21 0445 11/19/21 0435 11/20/21 0447 11/21/21 0441 11/22/21 0512  NA 135  --   --  136  --  137  --   K 3.6  --   --  4.4  --  4.6  --   CL 100  --   --  98  --  100  --   CO2 27  --   --  28  --  27  --   GLUCOSE 86  --   --  83  --  88  --   BUN 37*  --   --  44*  --  41*  --   CREATININE 7.01*  --   --  8.59*  --  7.56*  --   CALCIUM 9.1  --   --  9.7  --  8.8*  --   MG 2.0 1.8 1.9 2.0 1.8 2.0  --   PHOS 4.2  4.4 3.4 4.0 4.5  4.5 4.0 4.1  4.1 2.9   Liver Function Tests: Recent Labs  Lab 11/16/21 0547 11/19/21 0435 11/21/21 0441  ALBUMIN 2.2* 2.5* 2.4*   CBG: No results for input(s): "GLUCAP" in the last 168 hours.  Discharge time spent: greater than 30 minutes.  Signed: Fritzi Mandes, MD [facility] 11/22/2021

## 2021-11-22 NOTE — Progress Notes (Signed)
[facility]  ROUNDING NOTE   Subjective:   Teresa Cook is a 66 year old African-American female with past medical conditions including hypertension, gout, anxiety with depression, sickle cell trait, systolic heart failure with a EF 40 to 45%, breast cancer with bilateral mastectomy, seizures, and end-stage renal disease on hemodialysis.  Patient presents to the emergency department via EMS for multiple syncopal episodes.  Patient has been admitted for Right flank pain [R10.9] Elevated lactic acid level [R79.89] Hypotension [I95.9] Hypotension, unspecified hypotension type [I95.9] Altered mental status, unspecified altered mental status type [R41.82]  Patient is known to our practice and receives outpatient dialysis treatments at [facility] on a MWF schedule, supervised by Dr. Candiss Norse.    Patient seen laying in bed Alert and oriented Appetite remains poor, consuming Nepro shakes Room air Reports abd pain greatly improved.  LBM this morning.   Objective:  Vital signs in last 24 hours:  Temp:  [98.1 F (36.7 C)-99 F (37.2 C)] 98.1 F (36.7 C) (11/16 0806) Pulse Rate:  [66-77] 72 (11/16 0806) Resp:  [13-24] 16 (11/16 0806) BP: (114-141)/(67-85) 141/80 (11/16 0806) SpO2:  [89 %-99 %] 95 % (11/16 0806) Weight:  [65 kg-68 kg] 65 kg (11/16 0411)  Weight change:  Filed Weights   11/21/21 1633 11/21/21 1640 11/22/21 0411  Weight: 67.2 kg 67 kg 65 kg    Intake/Output: I/O last 3 completed shifts: In: 0  Out: 2000 [Other:2000]   Intake/Output this shift:  No intake/output data recorded.  Physical Exam: General: NAD  Head: Normocephalic, atraumatic. Moist oral mucosal membranes  Eyes: Anicteric  Lungs:  Clear to auscultation, normal effort, room air  Heart: Regular rate and rhythm  Abdomen:  Soft, tender, BS present  Extremities:  No peripheral edema.  Neurologic: Nonfocal, moving all four extremities  Skin: No lesions  Access: Lt Permcath     Basic Metabolic Panel: Recent Labs  Lab 11/16/21 0547 11/17/21 0455 11/18/21 0445 11/19/21 0435 11/20/21 0447 11/21/21 0441 11/22/21 0512  NA 135  --   --  136  --  137  --   K 3.6  --   --  4.4  --  4.6  --   CL 100  --   --  98  --  100  --   CO2 27  --   --  28  --  27  --   GLUCOSE 86  --   --  83  --  88  --   BUN 37*  --   --  44*  --  41*  --   CREATININE 7.01*  --   --  8.59*  --  7.56*  --   CALCIUM 9.1  --   --  9.7  --  8.8*  --   MG 2.0 1.8 1.9 2.0 1.8 2.0  --   PHOS 4.2  4.4 3.4 4.0 4.5  4.5 4.0 4.1  4.1 2.9     Liver Function Tests: Recent Labs  Lab 11/16/21 0547 11/19/21 0435 11/21/21 0441  ALBUMIN 2.2* 2.5* 2.4*    No results for input(s): "LIPASE", "AMYLASE" in the last 168 hours. No results for input(s): "AMMONIA" in the last 168 hours.  CBC: Recent Labs  Lab 11/17/21 0455 11/18/21 0445 11/19/21 0435 11/20/21 0447 11/21/21 0441 11/22/21 0512  WBC 8.1 7.5 8.5 6.5 7.3  --   NEUTROABS 5.8 5.5 6.2 4.5 5.2  --   HGB 7.8* 7.8* 8.8* 8.0* 7.8* 7.9*  HCT 22.9* 23.1*  25.8* 24.0* 23.1*  --   MCV 81.2 81.9 81.1 82.8 83.1  --   PLT 260 268 326 244 231  --      Cardiac Enzymes: No results for input(s): "CKTOTAL", "CKMB", "CKMBINDEX", "TROPONINI" in the last 168 hours.  BNP: Invalid input(s): "POCBNP"  CBG: No results for input(s): "GLUCAP" in the last 168 hours.   Microbiology: Results for orders placed or performed during the hospital encounter of 11/05/21  Blood Culture (routine x 2)     Status: None   Collection Time: 11/05/21  3:20 AM   Specimen: BLOOD  Result Value Ref Range Status   Specimen Description BLOOD RIGHT FA  Final   Special Requests   Final    BOTTLES DRAWN AEROBIC AND ANAEROBIC Blood Culture results may not be optimal due to an inadequate volume of blood received in culture bottles   Culture   Final    NO GROWTH 5 DAYS Performed at [facility], [address] [place], [place] [address]    Report  Status 11/10/2021 FINAL  Final  SARS Coronavirus 2 by RT PCR (hospital order, performed in [facility] hospital lab) *cepheid single result test* Anterior Nasal Swab     Status: None   Collection Time: 11/05/21  3:20 AM   Specimen: Anterior Nasal Swab  Result Value Ref Range Status   SARS Coronavirus 2 by RT PCR NEGATIVE NEGATIVE Final    Comment: (NOTE) SARS-CoV-2 target nucleic acids are NOT DETECTED.  The SARS-CoV-2 RNA is generally detectable in upper and lower respiratory specimens during the acute phase of infection. The lowest concentration of SARS-CoV-2 viral copies this assay can detect is 250 copies / mL. A negative result does not preclude SARS-CoV-2 infection and should not be used as the sole basis for treatment or other patient management decisions.  A negative result may occur with improper specimen collection / handling, submission of specimen other than nasopharyngeal swab, presence of viral mutation(s) within the areas targeted by this assay, and inadequate number of viral copies (<250 copies / mL). A negative result must be combined with clinical observations, patient history, and epidemiological information.  Fact Sheet for Patients:   https://www.patel.info/  Fact Sheet for Healthcare Providers: https://hall.com/  This test is not yet approved or  cleared by the [place] FDA and has been authorized for detection and/or diagnosis of SARS-CoV-2 by FDA under an Emergency Use Authorization (EUA).  This EUA will remain in effect (meaning this test can be used) for the duration of the COVID-19 declaration under Section 564(b)(1) of the Act, 21 U.S.C. section 360bbb-3(b)(1), unless the authorization is terminated or revoked sooner.  Performed at [facility], [address] [place], [place] [address]   Blood Culture (routine x 2)     Status: None   Collection Time: 11/05/21  3:21 AM   Specimen: BLOOD   Result Value Ref Range Status   Specimen Description BLOOD RIGHT HAND  Final   Special Requests   Final    BOTTLES DRAWN AEROBIC AND ANAEROBIC Blood Culture results may not be optimal due to an inadequate volume of blood received in culture bottles   Culture   Final    NO GROWTH 5 DAYS Performed at [facility], [address], [place], [place] [address]    Report Status 11/10/2021 FINAL  Final  MRSA Next Gen by PCR, Nasal     Status: None   Collection Time: 11/05/21  6:57 AM   Specimen: Nasal  Mucosa; Nasal Swab  Result Value Ref Range Status   MRSA by PCR Next Gen NOT DETECTED NOT DETECTED Final    Comment: (NOTE) The GeneXpert MRSA Assay (FDA approved for NASAL specimens only), is one component of a comprehensive MRSA colonization surveillance program. It is not intended to diagnose MRSA infection nor to guide or monitor treatment for MRSA infections. Test performance is not FDA approved in patients less than 25 years old. Performed at [facility], [address] [place], [place] [address]     Coagulation Studies: No results for input(s): "LABPROT", "INR" in the last 72 hours.   Urinalysis: Recent Labs    11/21/21 1035  COLORURINE RED*  LABSPEC >1.030*  PHURINE TEST NOT REPORTED DUE TO COLOR INTERFERENCE OF URINE PIGMENT  GLUCOSEU TEST NOT REPORTED DUE TO COLOR INTERFERENCE OF URINE PIGMENT*  HGBUR TEST NOT REPORTED DUE TO COLOR INTERFERENCE OF URINE PIGMENT*  BILIRUBINUR TEST NOT REPORTED DUE TO COLOR INTERFERENCE OF URINE PIGMENT*  KETONESUR TEST NOT REPORTED DUE TO COLOR INTERFERENCE OF URINE PIGMENT*  PROTEINUR TEST NOT REPORTED DUE TO COLOR INTERFERENCE OF URINE PIGMENT*  NITRITE TEST NOT REPORTED DUE TO COLOR INTERFERENCE OF URINE PIGMENT*  LEUKOCYTESUR TEST NOT REPORTED DUE TO COLOR INTERFERENCE OF URINE PIGMENT*      Imaging: DG Chest Port 1 View  Result Date: 11/22/2021 CLINICAL DATA:  Shortness of breath EXAM: PORTABLE CHEST 1 VIEW  COMPARISON:  Previous studies including the examination of 11/05/2021 FINDINGS: Cardiac size is within normal limits. Tip of left IJ dialysis catheter is seen at the junction of superior vena cava and right atrium. There are new linear densities in left mid and left lower lung fields. Increased interstitial markings are seen in right parahilar region and right lower lung field. There are no signs of alveolar pulmonary edema. There is no focal consolidation. There is minimal blunting of right lateral CP angle. There is no pneumothorax. IMPRESSION: There are new linear densities and increased interstitial markings in both parahilar regions and both lower lung fields suggesting subsegmental atelectasis and interstitial pneumonia. There are no signs of alveolar pulmonary edema. Minimal right pleural effusion. Electronically Signed   By: Elmer Picker M.D.   On: 11/22/2021 09:37     Medications:    sodium chloride 250 mL (11/12/21 2214)   anticoagulant sodium citrate      amiodarone  200 mg Oral BID   carvedilol  12.5 mg Oral BID WC   Chlorhexidine Gluconate Cloth  6 each Topical Q0600   cinacalcet  60 mg Oral QAC lunch   epoetin (EPOGEN/PROCRIT) injection  10,000 Units Intravenous Q M,W,F-HD   famotidine  20 mg Oral Daily   lidocaine  2 patch Transdermal Q24H   multivitamin  1 tablet Oral QHS   oxyCODONE  2.5 mg Oral Q6H   pantoprazole  40 mg Oral BID   sacubitril-valsartan  1 tablet Oral BID   sertraline  100 mg Oral Daily   sevelamer carbonate  2,400 mg Oral TID with meals   traZODone  50 mg Oral QHS   zolpidem  5 mg Oral QHS   acetaminophen, ALPRAZolam, alteplase, alum & mag hydroxide-simeth, anticoagulant sodium citrate, bisacodyl, fluticasone, heparin, lidocaine (PF), lidocaine-prilocaine, loperamide, methocarbamol, metoprolol tartrate, ondansetron (ZOFRAN) IV, mouth rinse, oxyCODONE, pentafluoroprop-tetrafluoroeth, polyethylene glycol, senna-docusate, sodium chloride  Assessment/  Plan:  Teresa Cook is a 66 y.o.  female is a 66 year old African-American female with past medical conditions including hypertension, gout, anxiety with depression, sickle cell trait,  systolic heart failure with a EF 40 to 45%, breast cancer with bilateral mastectomy, seizures, and end-stage renal disease on hemodialysis.  Patient presents to the emergency department via EMS for multiple syncopal episodes.  Patient has been admitted for Right flank pain [R10.9] Elevated lactic acid level [R79.89] Hypotension [I95.9] Hypotension, unspecified hypotension type [I95.9] Altered mental status, unspecified altered mental status type [R41.82]  [facility]/MWF/Lt Permcath  End-stage renal disease on hemodialysis.  Will maintain outpatient schedule if possible. Dialysis received yesterday, UF 1L achieved. Next treatment scheduled for Friday. Patient cleared to discharge from renal stance.  2. Anemia of chronic kidney disease with acute blood loss Lab Results  Component Value Date   HGB 7.9 (L) 11/22/2021  CT angio abdomen pelvis shows severe hemorrhage at right kidney.  Appreciate vascular surgery performing right renal angiogram with coil embolization to manage hemorrhage.  Patient received 2 units FFP and blood transfusions thus far during admission. Hemoglobin stable. Eliquis stopped yesterday. Will follow up with Cardiology at discharge.    3. Secondary Hyperparathyroidism: with outpatient labs: PTH 379, phosphorus 5.7, calcium 9.4 on June 18, 2021.   Lab Results  Component Value Date   PTH 214 (H) 07/08/2021   CALCIUM 8.8 (L) 11/21/2021   PHOS 2.9 11/22/2021  Bone minerals remain acceptable.  Continue sevelamer with all meals.  4.  Hypotension likely due to blood loss, Monitoring with recent reports of melena. Blood pressure stable  5.  Hyperkalemia, Corrected    LOS: [place] 11/16/202311:20 AM

## 2021-11-22 NOTE — TOC Transition Note (Addendum)
Transition of Care [facility]) - CM/SW Discharge Note   Patient Details  Name: Teresa Cook MRN: [id number] Date of Birth: 03-29-1955  Transition of Care [facility]) CM/SW Contact:  Beverly Sessions, RN Phone Number: 11/22/2021, 10:55 AM   Clinical Narrative:     Patient to discharge today Daughter at bed side for transport Confirms patient has received RW for discharge Patient again declines home health for discharge.  MD notified    Elvera Bicker dialysis liaison notified of discharge    Barriers to Discharge: Continued Medical Work up   Patient Goals and CMS Choice Patient states their goals for this hospitalization and ongoing recovery are:: Patient wants to get back home when medically able      Discharge Placement                       Discharge Plan and Services   Discharge Planning Services: CM Consult                      HH Arranged: Patient Refused [facility]          Social Determinants of Health ([place]) Interventions     Readmission Risk Interventions    11/06/2021   12:18 PM 07/30/2021    2:09 PM  Readmission Risk Prevention Plan  Transportation Screening Complete Complete  Medication Review ([place]) Complete Complete  PCP or Specialist appointment within 3-5 days of discharge Complete Complete  HRI or Home Care Consult Patient refused Complete  SW Recovery Care/Counseling Consult Not Complete   SW Consult Not Complete Comments NA   Palliative Care Screening Not Applicable Not [place] Not Applicable Not Applicable

## 2021-11-26 ENCOUNTER — Encounter: Payer: Self-pay | Admitting: *Deleted

## 2021-11-26 NOTE — Progress Notes (Deleted)
Cardiology Clinic Note   Patient Name: Teresa Cook Date of Encounter: 11/26/2021  Primary Care Provider:  [facility] Primary Cardiologist:  None  Patient Profile    66 year old female with history of end-stage renal disease on hemodialysis, congestive heart failure, paroxysmal atrial fibrillation on chronic Coumadin, DVT, cerebral meningioma, recurrent depression, breast cancer, who is here today after recent hospitalization for atrial fibrillation RVR and episodic nonsustained ventricular tachycardia during her recent hospitalization.  Past Medical History    Past Medical History:  Diagnosis Date   Anemia    Anxiety    Breast cancer ([place]) 01/2016   bilateral   CHF (congestive heart failure) (HCC)    Chronic kidney disease    Depression    Dialysis patient [facility])    DVT (deep venous thrombosis) ([place])    left leg   DVT (deep venous thrombosis) ([place]) 1985   right thigh   Dysrhythmia    Gout    Headache    HTN (hypertension)    Hypertension    Parathyroid abnormality (HCC)    Parathyroid disease ([place])    Pneumonia 12/2015   Psoriasis    Renal insufficiency    Sickle cell trait ([place])    traits   Past Surgical History:  Procedure Laterality Date   ABDOMINAL HYSTERECTOMY  1980   APPENDECTOMY     BREAST BIOPSY Left 10/28/2013   benign   BREAST EXCISIONAL BIOPSY Left 2002   benign   CORONARY ANGIOGRAPHY N/A 04/04/2020   Procedure: CORONARY ANGIOGRAPHY;  Surgeon: Wellington Hampshire, MD;  Location: [facility];  Service: Cardiovascular;  Laterality: N/A;   INSERTION OF DIALYSIS CATHETER  2014   LIPOMA EXCISION N/A 01/23/2016   Procedure: EXCISION LIPOMA;  Surgeon: Hubbard Robinson, MD;  Location: [facility];  Service: General;  Laterality: N/A;   MASTECTOMY W/ SENTINEL NODE BIOPSY Bilateral 01/23/2016   Procedure: bilateral MASTECTOMY WITH  bilateral SENTINEL LYMPH NODE BIOPSY possible left axillary node dissection forehead lipoma  removal;  Surgeon: Hubbard Robinson, MD;  Location: [facility];  Service: General;  Laterality: Bilateral;   PARTIAL HYSTERECTOMY     PERIPHERAL VASCULAR CATHETERIZATION N/A 12/25/2015   Procedure: Dialysis/Perma Catheter Insertion;  Surgeon: Algernon Huxley, MD;  Location: [place] [facility];  Service: Cardiovascular;  Laterality: N/A;   PERIPHERAL VASCULAR CATHETERIZATION Left 01/22/2016   Procedure: Dialysis/Perma Catheter Insertion;  Surgeon: Algernon Huxley, MD;  Location: [facility];  Service: Cardiovascular;  Laterality: Left;   PERIPHERAL VASCULAR CATHETERIZATION N/A 01/26/2016   Procedure: Dialysis/Perma Catheter Insertion;  Surgeon: Katha Cabal, MD;  Location: [facility];  Service: Cardiovascular;  Laterality: N/A;   PORT-A-CATH REMOVAL N/A 12/20/2015   Procedure: REMOVAL PORT-A-CATH;  Surgeon: Hubbard Robinson, MD;  Location: [facility];  Service: General;  Laterality: N/A;  left     PORTACATH PLACEMENT Left 08/21/2015   Procedure: INSERTION PORT-A-CATH;  Surgeon: Hubbard Robinson, MD;  Location: [facility];  Service: General;  Laterality: Left;   REMOVAL OF A DIALYSIS CATHETER  2017   RENAL ANGIOGRAPHY Right 11/05/2021   Procedure: RENAL ANGIOGRAPHY;  Surgeon: Algernon Huxley, MD;  Location: [place] [facility];  Service: Cardiovascular;  Laterality: Right;  with embolization   RIGHT HEART CATH N/A 04/04/2020   Procedure: RIGHT HEART CATH;  Surgeon: Wellington Hampshire, MD;  Location: [facility];  Service: Cardiovascular;  Laterality: N/A;    Allergies  Allergies  Allergen Reactions   Gabapentin Other (See Comments)    Seizure   Adhesive [Tape] Itching    Silk tape is ok to use.    History of Present Illness    Teresa Cook is a 66 year old female with past medical history significant for end-stage renal disease on hemodialysis with chronic permacath, congestive heart failure, DVT, cerebral meningioma, paroxysmal atrial fibrillation previously  anticoagulated with warfarin, episodic nonsustained ventricular tachycardia episodes, and breast cancer.  She presented to the hospital 01/21/2018 with dyspnea on exertion.  Work-up with chest x-ray showed interstitial edema.  She was diagnosed with volume overload managed with BiPAP and fluid removal through dialysis.  Echocardiogram on 01/23/2020 showed moderate reduced ejection fraction 40%.  Aortic valve calcification was noted.  With no significant stenosis.  Previous outside echocardiogram on 09/02/2018 showed a documented EF of 55%.  Outside myocardial perfusion study on 09/02/2019/no evidence of ischemia.  She was last seen in clinic in 02/29/2020 at that time she had NYHA class II-III symptoms appear euvolemic.  Echocardiogram showed moderately reduced EF 35 to 40%.  She was subsequently scheduled for right and left heart catheterization.  She was continued on Toprol-XL 50 mg daily started on Entresto 24/26 mg twice daily and her Imdur was placed on hold.  Heart catheterization revealed proximal circumflex to mid circumflex lesion with 60% stenosis.  Borderline one-vessel coronary artery disease with 60% stenosis in the mid left circumflex.  Right heart catheterization showed normal filling pressures, normal pulmonary pressure, and normal cardiac output.  She presented to the [facility] emergency department 07/27/2021 for shortness of breath x1 day.  She also reported orthopnea with chest tightness and cough.  She stated she been compliant with medications and had not missed any hemodialysis treatments.  EMS found her with O2 saturations of 88% on room air and she was placed on 3 L O2 via nasal cannula.  Blood pressure in the emergency department was 190/128 with respiratory rate of 22-29.  WBCs were 6.9, hemoglobin 9.6, INR 2.6, high-sensitivity troponin was 28, and a BNP of 1390.  Chest x-ray showed increased interstitial markings favoring atypical infection over interstitial edema.  EKG showed normal sinus  rhythm she was given Nitropaste and it was admitted.  Echocardiogram completed revealed LVEF of 50-55%, moderate concentric left ventricular hypertrophy, G2 DD, mildly elevated pulmonary artery systolic pressure, and mild to moderate mitral regurgitation, severe calcification of the aortic valve.  She presented again to the [facility] emergency department 10/26/2021 status post fall and loss of consciousness.  According to records of on admission she passed out again and hit her head.  Following the fall she reported development of low back and flank pain that then radiated around the right lower quadrant.  She was on chronic warfarin therapy for paroxysmal atrial fibrillation and DVT.  Unfortunately she was found to be in hemorrhagic shock due to right renal hemorrhage and a large hematoma present with an INR greater than 9.  Coumadin was reversed with Kcentra and vitamin K along with 2 units of FFP she also received 2 units of packed RBCs.  Vascular surgery was consulted and she required embolization of the right renal artery.  She was found to be in A-fib RVR with episodic nonsustained V. tach and subsequently was started on amiodarone drip.  She finally converted back to sinus rhythm on 11/07/2021 and was transitioned to oral amiodarone once she was able to tolerate oral medications.  She was challenged with IV heparin on 11/7 and transition to  oral apixaban on 11 /8.  Hemoglobin had remained stable since.  She continued to have pain.  CT scan was completed on 11/11 without a worsening hematoma.  On 1115 showed stable hemoglobin of 7.9 but had noted hematuria and apixaban was placed on hold.  Volume continue to be managed by nephrology with dialysis.  For HFpEF she was continued on Entresto, Coreg, and hydralazine.  She was subsequently discharged from the facility on 11/22/2021.  She returns to clinic today  Home Medications    Current Outpatient Medications  Medication Sig Dispense Refill   ALPRAZolam  (XANAX) 0.25 MG tablet Take 1 tablet (0.25 mg total) by mouth 2 (two) times daily as needed for anxiety. 15 tablet 0   amiodarone (PACERONE) 200 MG tablet Take 1 tablet (200 mg total) by mouth 2 (two) times daily. 60 tablet 1   b complex-vitamin c-folic acid (NEPHRO-VITE) 0.8 MG TABS tablet Take 1 tablet by mouth daily.     calcium carbonate (TUMS - DOSED IN MG ELEMENTAL CALCIUM) 500 MG chewable tablet Chew 1 tablet (200 mg of elemental calcium total) by mouth 2 (two) times daily as needed for indigestion or heartburn. 60 tablet 0   carvedilol (COREG) 12.5 MG tablet Take 1 tablet (12.5 mg total) by mouth 2 (two) times daily. 60 tablet 0   cinacalcet (SENSIPAR) 60 MG tablet Take 60 mg by mouth daily before lunch.     hydrALAZINE (APRESOLINE) 25 MG tablet Take 1 tablet (25 mg total) by mouth 3 (three) times daily. 90 tablet 0   lidocaine (LIDODERM) 5 % Place 2 patches onto the skin daily. Remove & Discard patch within 12 hours or as directed by MD 30 patch 0   oxyCODONE (OXY IR/ROXICODONE) 5 MG immediate release tablet Take 1 tablet (5 mg total) by mouth every 6 (six) hours as needed for moderate pain. 20 tablet 0   pantoprazole (PROTONIX) 40 MG tablet Take 1 tablet (40 mg total) by mouth 2 (two) times daily. 60 tablet 0   polyethylene glycol (MIRALAX / GLYCOLAX) 17 g packet Take 17 g by mouth daily as needed for mild constipation. 14 each 0   sacubitril-valsartan (ENTRESTO) 24-26 MG Take 1 tablet by mouth 2 (two) times daily. 180 tablet 3   sertraline (ZOLOFT) 100 MG tablet Take 100 mg by mouth daily.     sevelamer carbonate (RENVELA) 800 MG tablet Take 3 tablets (2,400 mg total) by mouth with breakfast, with lunch, and with evening meal. One with snacks 300 tablet 0   traZODone (DESYREL) 50 MG tablet Take 50 mg by mouth at bedtime.     zolpidem (AMBIEN) 10 MG tablet Take 0.5 tablets (5 mg total) by mouth at bedtime. 30 tablet 0   No current facility-administered medications for this visit.      Family History    Family History  Problem Relation Age of Onset   Stroke Mother    CVA Mother    Hypertension Mother    Hypertension Father    Hypertension Sister    Diabetes Sister    Cancer Sister 9       Breast   Stroke Brother    Hypertension Brother    Breast cancer Maternal Aunt 70   She indicated that her mother is deceased. She indicated that her father is deceased. She indicated that the status of her sister is unknown. She indicated that the status of her brother is unknown. She indicated that the status of her maternal aunt is  unknown.  Social History    Social History   Socioeconomic History   Marital status: Widowed    Spouse name: Not on file   Number of children: Not on file   Years of education: Not on file   Highest education level: Not on file  Occupational History   Occupation: disabled  Tobacco Use   Smoking status: Never   Smokeless tobacco: Never  Vaping Use   Vaping Use: Never used  Substance and Sexual Activity   Alcohol use: No    Alcohol/week: 0.0 standard drinks of alcohol   Drug use: No   Sexual activity: Never    Birth control/protection: Surgical  Other Topics Concern   Not on file  Social History Narrative   ** Merged History Encounter **       Social Determinants of Health   Financial Resource Strain: Not on file  Food Insecurity: No Food Insecurity (11/11/2021)   Hunger Vital Sign    Worried About Running Out of Food in the Last Year: Never true    Ran Out of Food in the Last Year: Never true  Transportation Needs: No Transportation Needs (11/11/2021)   PRAPARE - Hydrologist (Medical): No    Lack of Transportation (Non-Medical): No  Physical Activity: Not on file  Stress: Not on file  Social Connections: Not on file  Intimate Partner Violence: Not At Risk (11/11/2021)   Humiliation, Afraid, Rape, and Kick questionnaire    Fear of Current or Ex-Partner: No    Emotionally Abused: No     Physically Abused: No    Sexually Abused: No     Review of Systems    General:  No chills, fever, night sweats or weight changes.  Cardiovascular:  No chest pain, dyspnea on exertion, edema, orthopnea, palpitations, paroxysmal nocturnal dyspnea. Dermatological: No rash, lesions/masses Respiratory: No cough, dyspnea Urologic: No hematuria, dysuria Abdominal:   No nausea, vomiting, diarrhea, bright red blood per rectum, melena, or hematemesis Neurologic:  No visual changes, wkns, changes in mental status. All other systems reviewed and are otherwise negative except as noted above.     Physical Exam    VS:  There were no vitals taken for this visit. , BMI There is no height or weight on file to calculate BMI.     GEN: Well nourished, well developed, in no acute distress. HEENT: normal. Neck: Supple, no JVD, carotid bruits, or masses. Cardiac: RRR, no murmurs, rubs, or gallops. No clubbing, cyanosis, edema.  Radials/DP/PT 2+ and equal bilaterally.  Respiratory:  Respirations regular and unlabored, clear to auscultation bilaterally. GI: Soft, nontender, nondistended, BS + x 4. MS: no deformity or atrophy. Skin: warm and dry, no rash. Neuro:  Strength and sensation are intact. Psych: Normal affect.  Accessory Clinical Findings    ECG personally reviewed by me today- *** - No acute changes  Lab Results  Component Value Date   WBC 7.3 11/21/2021   HGB 7.9 (L) 11/22/2021   HCT 23.1 (L) 11/21/2021   MCV 83.1 11/21/2021   PLT 231 11/21/2021   Lab Results  Component Value Date   CREATININE 7.56 (H) 11/21/2021   BUN 41 (H) 11/21/2021   NA 137 11/21/2021   K 4.6 11/21/2021   CL 100 11/21/2021   CO2 27 11/21/2021   Lab Results  Component Value Date   ALT 11 11/05/2021   AST 19 11/05/2021   ALKPHOS 58 11/05/2021   BILITOT 0.5 11/05/2021   Lab  Results  Component Value Date   CHOL 178 07/09/2021   HDL 57 07/09/2021   LDLCALC 113 (H) 07/09/2021   TRIG 41 07/09/2021    CHOLHDL 3.1 07/09/2021    Lab Results  Component Value Date   HGBA1C 5.6 11/06/2021    Assessment & Plan   1.  ***  Sharene Krikorian, NP 11/26/2021, 1:28 PM

## 2021-11-27 ENCOUNTER — Ambulatory Visit: Payer: Medicare Other | Attending: Cardiology | Admitting: Cardiology

## 2021-11-28 ENCOUNTER — Encounter: Payer: Self-pay | Admitting: Cardiology

## 2021-11-30 ENCOUNTER — Emergency Department: Payer: Medicare Other

## 2021-11-30 ENCOUNTER — Inpatient Hospital Stay
Admission: EM | Admit: 2021-11-30 | Discharge: 2021-12-06 | DRG: 299 | Disposition: A | Discharge status: Home / Self Care | Payer: Medicare Other | Attending: Osteopathic Medicine | Admitting: Osteopathic Medicine

## 2021-11-30 ENCOUNTER — Other Ambulatory Visit: Payer: Self-pay

## 2021-11-30 DIAGNOSIS — I959 Hypotension, unspecified: Secondary | ICD-10-CM | POA: Diagnosis present

## 2021-11-30 DIAGNOSIS — Z888 Allergy status to other drugs, medicaments and biological substances status: Secondary | ICD-10-CM

## 2021-11-30 DIAGNOSIS — I132 Hypertensive heart and chronic kidney disease with heart failure and with stage 5 chronic kidney disease, or end stage renal disease: Secondary | ICD-10-CM | POA: Diagnosis present

## 2021-11-30 DIAGNOSIS — Z9013 Acquired absence of bilateral breasts and nipples: Secondary | ICD-10-CM

## 2021-11-30 DIAGNOSIS — F419 Anxiety disorder, unspecified: Secondary | ICD-10-CM | POA: Diagnosis present

## 2021-11-30 DIAGNOSIS — I82451 Acute embolism and thrombosis of right peroneal vein: Principal | ICD-10-CM | POA: Diagnosis present

## 2021-11-30 DIAGNOSIS — I10 Essential (primary) hypertension: Secondary | ICD-10-CM | POA: Diagnosis present

## 2021-11-30 DIAGNOSIS — G40909 Epilepsy, unspecified, not intractable, without status epilepticus: Secondary | ICD-10-CM | POA: Diagnosis present

## 2021-11-30 DIAGNOSIS — I824Y1 Acute embolism and thrombosis of unspecified deep veins of right proximal lower extremity: Secondary | ICD-10-CM | POA: Diagnosis present

## 2021-11-30 DIAGNOSIS — Z992 Dependence on renal dialysis: Secondary | ICD-10-CM

## 2021-11-30 DIAGNOSIS — Z91048 Other nonmedicinal substance allergy status: Secondary | ICD-10-CM

## 2021-11-30 DIAGNOSIS — N368 Other specified disorders of urethra: Secondary | ICD-10-CM | POA: Diagnosis present

## 2021-11-30 DIAGNOSIS — R319 Hematuria, unspecified: Secondary | ICD-10-CM | POA: Diagnosis present

## 2021-11-30 DIAGNOSIS — Z853 Personal history of malignant neoplasm of breast: Secondary | ICD-10-CM

## 2021-11-30 DIAGNOSIS — Z86718 Personal history of other venous thrombosis and embolism: Secondary | ICD-10-CM

## 2021-11-30 DIAGNOSIS — M109 Gout, unspecified: Secondary | ICD-10-CM | POA: Diagnosis present

## 2021-11-30 DIAGNOSIS — L89322 Pressure ulcer of left buttock, stage 2: Secondary | ICD-10-CM | POA: Diagnosis present

## 2021-11-30 DIAGNOSIS — Z7901 Long term (current) use of anticoagulants: Secondary | ICD-10-CM

## 2021-11-30 DIAGNOSIS — Z8249 Family history of ischemic heart disease and other diseases of the circulatory system: Secondary | ICD-10-CM

## 2021-11-30 DIAGNOSIS — I160 Hypertensive urgency: Secondary | ICD-10-CM | POA: Diagnosis present

## 2021-11-30 DIAGNOSIS — D631 Anemia in chronic kidney disease: Secondary | ICD-10-CM | POA: Diagnosis present

## 2021-11-30 DIAGNOSIS — Z803 Family history of malignant neoplasm of breast: Secondary | ICD-10-CM

## 2021-11-30 DIAGNOSIS — N186 End stage renal disease: Secondary | ICD-10-CM | POA: Diagnosis not present

## 2021-11-30 DIAGNOSIS — I480 Paroxysmal atrial fibrillation: Secondary | ICD-10-CM | POA: Diagnosis present

## 2021-11-30 DIAGNOSIS — Z79899 Other long term (current) drug therapy: Secondary | ICD-10-CM

## 2021-11-30 DIAGNOSIS — N2581 Secondary hyperparathyroidism of renal origin: Secondary | ICD-10-CM | POA: Diagnosis present

## 2021-11-30 DIAGNOSIS — D6859 Other primary thrombophilia: Secondary | ICD-10-CM | POA: Diagnosis present

## 2021-11-30 DIAGNOSIS — L89312 Pressure ulcer of right buttock, stage 2: Secondary | ICD-10-CM | POA: Diagnosis present

## 2021-11-30 DIAGNOSIS — D573 Sickle-cell trait: Secondary | ICD-10-CM | POA: Diagnosis present

## 2021-11-30 DIAGNOSIS — I5032 Chronic diastolic (congestive) heart failure: Secondary | ICD-10-CM | POA: Diagnosis present

## 2021-11-30 DIAGNOSIS — F331 Major depressive disorder, recurrent, moderate: Secondary | ICD-10-CM | POA: Diagnosis present

## 2021-11-30 LAB — CBC
HCT: 33.6 % — ABNORMAL LOW (ref 36.0–46.0)
Hemoglobin: 10.7 g/dL — ABNORMAL LOW (ref 12.0–15.0)
MCH: 27.6 pg (ref 26.0–34.0)
MCHC: 31.8 g/dL (ref 30.0–36.0)
MCV: 86.6 fL (ref 80.0–100.0)
Platelets: 219 10*3/uL (ref 150–400)
RBC: 3.88 MIL/uL (ref 3.87–5.11)
RDW: 19.7 % — ABNORMAL HIGH (ref 11.5–15.5)
WBC: 10.8 10*3/uL — ABNORMAL HIGH (ref 4.0–10.5)
nRBC: 0 % (ref 0.0–0.2)

## 2021-11-30 LAB — BASIC METABOLIC PANEL WITH GFR
Anion gap: 14 (ref 5–15)
BUN: 30 mg/dL — ABNORMAL HIGH (ref 8–23)
CO2: 23 mmol/L (ref 22–32)
Calcium: 10.7 mg/dL — ABNORMAL HIGH (ref 8.9–10.3)
Chloride: 106 mmol/L (ref 98–111)
Creatinine, Ser: 8.35 mg/dL — ABNORMAL HIGH (ref 0.44–1.00)
GFR, Estimated: 5 mL/min — ABNORMAL LOW
Glucose, Bld: 100 mg/dL — ABNORMAL HIGH (ref 70–99)
Potassium: 3.5 mmol/L (ref 3.5–5.1)
Sodium: 143 mmol/L (ref 135–145)

## 2021-11-30 LAB — MAGNESIUM: Magnesium: 2.2 mg/dL (ref 1.7–2.4)

## 2021-11-30 LAB — PROTIME-INR
INR: 1.3 — ABNORMAL HIGH (ref 0.8–1.2)
Prothrombin Time: 16.1 seconds — ABNORMAL HIGH (ref 11.4–15.2)

## 2021-11-30 LAB — HEPATITIS B CORE ANTIBODY, IGM: Hep B C IgM: NONREACTIVE

## 2021-11-30 LAB — CBG MONITORING, ED: Glucose-Capillary: 80 mg/dL (ref 70–99)

## 2021-11-30 LAB — TROPONIN I (HIGH SENSITIVITY): Troponin I (High Sensitivity): 11 ng/L

## 2021-11-30 LAB — HEPATITIS B CORE ANTIBODY, TOTAL: Hep B Core Total Ab: NONREACTIVE

## 2021-11-30 LAB — HEPATITIS B SURFACE ANTIGEN: Hepatitis B Surface Ag: NONREACTIVE

## 2021-11-30 MED ORDER — SEVELAMER CARBONATE 800 MG PO TABS
2400.0000 mg | ORAL_TABLET | Freq: Three times a day (TID) | ORAL | Status: DC
  Administered 2021-11-30 – 2021-12-04 (×10): 2400 mg via ORAL
  Filled 2021-11-30 (×14): qty 3

## 2021-11-30 MED ORDER — ACETAMINOPHEN 325 MG PO TABS: 650.0000 mg | ORAL_TABLET | Freq: Four times a day (QID) | ORAL | Status: DC | PRN

## 2021-11-30 MED ORDER — HEPARIN (PORCINE) 25000 UT/250ML-% IV SOLN
1000.0000 [IU]/h | INTRAVENOUS | Status: DC
  Administered 2021-11-30 – 2021-12-01 (×3): 1100 [IU]/h via INTRAVENOUS
  Administered 2021-12-02: 1000 [IU]/h via INTRAVENOUS
  Filled 2021-11-30 (×3): qty 250

## 2021-11-30 MED ORDER — LIDOCAINE HCL (PF) 1 % IJ SOLN: 5.0000 mL | INTRAMUSCULAR | Status: DC | PRN

## 2021-11-30 MED ORDER — ONDANSETRON HCL 4 MG PO TABS: 4.0000 mg | ORAL_TABLET | Freq: Four times a day (QID) | ORAL | Status: DC | PRN

## 2021-11-30 MED ORDER — ALTEPLASE 2 MG IJ SOLR: 2.0000 mg | Freq: Once | INTRAMUSCULAR | Status: DC | PRN

## 2021-11-30 MED ORDER — CINACALCET HCL 30 MG PO TABS: 60.0000 mg | ORAL_TABLET | Freq: Every day | ORAL | Status: DC

## 2021-11-30 MED ORDER — SODIUM CHLORIDE 0.9 % IV SOLN
250.0000 mL | INTRAVENOUS | Status: DC | PRN
  Administered 2021-12-02 – 2021-12-03 (×2): 250 mL via INTRAVENOUS

## 2021-11-30 MED ORDER — HEPARIN BOLUS VIA INFUSION
4600.0000 [IU] | Freq: Once | INTRAVENOUS | Status: AC
  Administered 2021-11-30: 4600 [IU] via INTRAVENOUS
  Filled 2021-11-30: qty 4600

## 2021-11-30 MED ORDER — PENTAFLUOROPROP-TETRAFLUOROETH EX AERO: 1.0000 | INHALATION_SPRAY | CUTANEOUS | Status: DC | PRN

## 2021-11-30 MED ORDER — LIDOCAINE-PRILOCAINE 2.5-2.5 % EX CREA: 1.0000 | TOPICAL_CREAM | CUTANEOUS | Status: DC | PRN

## 2021-11-30 MED ORDER — TRAZODONE HCL 50 MG PO TABS
50.0000 mg | ORAL_TABLET | Freq: Every day | ORAL | Status: DC
  Administered 2021-12-01 – 2021-12-05 (×6): 50 mg via ORAL
  Filled 2021-11-30 (×7): qty 1

## 2021-11-30 MED ORDER — PANTOPRAZOLE SODIUM 40 MG PO TBEC
40.0000 mg | DELAYED_RELEASE_TABLET | Freq: Two times a day (BID) | ORAL | Status: DC
  Administered 2021-11-30 – 2021-12-05 (×9): 40 mg via ORAL
  Filled 2021-11-30 (×10): qty 1

## 2021-11-30 MED ORDER — SERTRALINE HCL 50 MG PO TABS
100.0000 mg | ORAL_TABLET | Freq: Every day | ORAL | Status: DC
  Administered 2021-12-01 – 2021-12-05 (×5): 100 mg via ORAL
  Filled 2021-11-30 (×5): qty 2

## 2021-11-30 MED ORDER — OXYCODONE HCL 5 MG PO TABS
5.0000 mg | ORAL_TABLET | Freq: Four times a day (QID) | ORAL | Status: DC | PRN
  Administered 2021-11-30: 5 mg via ORAL
  Filled 2021-11-30: qty 1

## 2021-11-30 MED ORDER — SODIUM CHLORIDE 0.9% FLUSH
3.0000 mL | INTRAVENOUS | Status: DC | PRN
  Administered 2021-12-03 – 2021-12-04 (×2): 3 mL via INTRAVENOUS

## 2021-11-30 MED ORDER — ALPRAZOLAM 0.25 MG PO TABS
0.2500 mg | ORAL_TABLET | Freq: Two times a day (BID) | ORAL | Status: DC | PRN
  Administered 2021-12-05: 0.25 mg via ORAL
  Filled 2021-11-30: qty 1

## 2021-11-30 MED ORDER — SACUBITRIL-VALSARTAN 24-26 MG PO TABS
1.0000 | ORAL_TABLET | Freq: Two times a day (BID) | ORAL | Status: DC
  Administered 2021-11-30 – 2021-12-04 (×9): 1 via ORAL
  Filled 2021-11-30 (×14): qty 1

## 2021-11-30 MED ORDER — ZOLPIDEM TARTRATE 5 MG PO TABS
5.0000 mg | ORAL_TABLET | Freq: Every day | ORAL | Status: DC
  Administered 2021-11-30 – 2021-12-05 (×6): 5 mg via ORAL
  Filled 2021-11-30 (×6): qty 1

## 2021-11-30 MED ORDER — CARVEDILOL 12.5 MG PO TABS
12.5000 mg | ORAL_TABLET | Freq: Two times a day (BID) | ORAL | Status: DC
  Administered 2021-12-01 – 2021-12-04 (×6): 12.5 mg via ORAL
  Filled 2021-11-30 (×9): qty 1

## 2021-11-30 MED ORDER — HEPARIN SODIUM (PORCINE) 1000 UNIT/ML DIALYSIS: 1000.0000 [IU] | INTRAMUSCULAR | Status: DC | PRN

## 2021-11-30 MED ORDER — ACETAMINOPHEN 650 MG RE SUPP: 650.0000 mg | Freq: Four times a day (QID) | RECTAL | Status: DC | PRN

## 2021-11-30 MED ORDER — SODIUM CHLORIDE 0.9% FLUSH
3.0000 mL | Freq: Two times a day (BID) | INTRAVENOUS | Status: DC
  Administered 2021-11-30 – 2021-12-05 (×9): 3 mL via INTRAVENOUS

## 2021-11-30 MED ORDER — AMIODARONE HCL 200 MG PO TABS
200.0000 mg | ORAL_TABLET | Freq: Two times a day (BID) | ORAL | Status: DC
  Administered 2021-12-01 – 2021-12-05 (×9): 200 mg via ORAL
  Filled 2021-11-30 (×10): qty 1

## 2021-11-30 MED ORDER — HYDRALAZINE HCL 25 MG PO TABS
25.0000 mg | ORAL_TABLET | Freq: Three times a day (TID) | ORAL | Status: DC
  Administered 2021-12-01 – 2021-12-03 (×8): 25 mg via ORAL
  Filled 2021-11-30 (×10): qty 1

## 2021-11-30 MED ORDER — RENA-VITE PO TABS
1.0000 | ORAL_TABLET | Freq: Every day | ORAL | Status: DC
  Administered 2021-12-01 – 2021-12-05 (×5): 1 via ORAL
  Filled 2021-11-30 (×7): qty 1

## 2021-11-30 MED ORDER — CHLORHEXIDINE GLUCONATE CLOTH 2 % EX PADS
6.0000 | MEDICATED_PAD | Freq: Every day | CUTANEOUS | Status: DC
  Administered 2021-12-01 – 2021-12-06 (×6): 6 via TOPICAL
  Filled 2021-11-30: qty 6

## 2021-11-30 MED ORDER — AMIODARONE HCL 200 MG PO TABS
200.0000 mg | ORAL_TABLET | Freq: Two times a day (BID) | ORAL | Status: DC
  Administered 2021-11-30: 200 mg via ORAL
  Filled 2021-11-30: qty 1

## 2021-11-30 MED ORDER — ANTICOAGULANT SODIUM CITRATE 4% (200MG/5ML) IV SOLN: 5.0000 mL | Status: DC | PRN

## 2021-11-30 MED ORDER — POLYETHYLENE GLYCOL 3350 17 G PO PACK
17.0000 g | PACK | Freq: Every day | ORAL | Status: DC | PRN
  Administered 2021-12-02: 17 g via ORAL
  Filled 2021-11-30: qty 1

## 2021-11-30 MED ORDER — CARVEDILOL 6.25 MG PO TABS
12.5000 mg | ORAL_TABLET | Freq: Two times a day (BID) | ORAL | Status: DC
  Administered 2021-11-30: 12.5 mg via ORAL
  Filled 2021-11-30: qty 2

## 2021-11-30 MED ORDER — HYDRALAZINE HCL 50 MG PO TABS: 25.0000 mg | ORAL_TABLET | Freq: Three times a day (TID) | ORAL | Status: DC

## 2021-11-30 MED ORDER — ONDANSETRON HCL 4 MG/2ML IJ SOLN
4.0000 mg | Freq: Four times a day (QID) | INTRAMUSCULAR | Status: DC | PRN
  Administered 2021-12-02 – 2021-12-05 (×2): 4 mg via INTRAVENOUS
  Filled 2021-11-30 (×2): qty 2

## 2021-11-30 MED ORDER — HYDRALAZINE HCL 50 MG PO TABS
25.0000 mg | ORAL_TABLET | Freq: Three times a day (TID) | ORAL | Status: DC
  Administered 2021-11-30: 25 mg via ORAL
  Filled 2021-11-30: qty 1

## 2021-11-30 NOTE — ED Notes (Signed)
Pt to dialysis.

## 2021-11-30 NOTE — Assessment & Plan Note (Signed)
Stable and not acutely exacerbated Continue carvedilol, Entresto and hydralazine Renal replacement therapy to manage volume status

## 2021-11-30 NOTE — Assessment & Plan Note (Signed)
Patient with a history of VTE who was on chronic anticoagulation with Coumadin and was recently hospitalized for right renal artery hemorrhage in the setting of a supratherapeutic INR. Patient's anticoagulation was placed on hold on discharge and now she has  an occlusive thrombus in the greater saphenous vein and peroneal vein. Nonocclusive thrombus at the saphenofemoral junction. Discussed with vascular surgery and will start patient on a heparin drip.  Hemoglobin today on admission is 10.7 compared to 7.9 on discharge Monitor H&H closely

## 2021-11-30 NOTE — Assessment & Plan Note (Signed)
Continue amiodarone and carvedilol for rate control We will place patient on IV heparin for stroke prophylaxis

## 2021-11-30 NOTE — ED Notes (Signed)
Blue top sent to lab in case needed.

## 2021-11-30 NOTE — H&P (Addendum)
History and Physical    Patient: Teresa Cook BTD:[id number] DOB: 08/06/1955 DOA: 11/30/2021 DOS: the patient was seen and examined on 11/30/2021 PCP: [facility]  Patient coming from: Home  Chief Complaint: Right leg pain  HPI: Teresa Cook is a 66 y.o. female with medical history significant for breast cancer status post bilateral mastectomy, seizure disorder, chronic diastolic dysfunction CHF, hypertension, anxiety with depression, end-stage renal disease on hemodialysis (M/W/F) who presented to the emergency room from dialysis center because she was unable to get her scheduled renal replacement therapy today. Patient states that she had gone for her scheduled appointment but they were unable to dialyze her because the machine kept clotting off.  The staff at the dialysis center asked her to go to the ER so she could be dialyzed. She complains of chest tightness and shortness of breath with exertion.  She has no lower extremity swelling. She also complains of pain in her right thigh and calf as well as a burning sensation. Chart review shows that she was recently discharged from the hospital after an admission for retroperitoneal bleed following renal artery hemorrhage in the setting of supratherapeutic INR.  She was seen by vascular surgery and had coil embolization of the right renal artery done.  Indications for anticoagulation in this patient include venous thromboembolic disease as well as paroxysmal A-fib.  She was started back on IV heparin during the hospitalization and then switched to Eliquis.  She was not discharged on Eliquis because cardiology and nephrology recommended to hold Eliquis in the setting of anemia/hematuria and they would decide when to resume as an outpatient. She continues to have mild hematuria as well as right flank pain but improved from admission. She denies having any chest pain, no nausea, no vomiting, no changes in her bowel  habits, no headache, no fever, no chills, no cough, no blurred vision no focal deficit. Labs show hemoglobin of 10.7 compared to 7.9 on admission. Lower extremity venous Doppler shows occlusive thrombus in the greater saphenous vein and peroneal vein. Nonocclusive thrombus at the saphenofemoral junction. She will be referred to observation status for further evaluation   Review of Systems: As mentioned in the history of present illness. All other systems reviewed and are negative. Past Medical History:  Diagnosis Date   Anemia    Anxiety    Breast cancer ([place]) 01/2016   bilateral   CHF (congestive heart failure) (HCC)    Chronic kidney disease    Depression    Dialysis patient [facility])    DVT (deep venous thrombosis) ([place])    left leg   DVT (deep venous thrombosis) ([place]) 1985   right thigh   Dysrhythmia    Gout    Headache    HTN (hypertension)    Hypertension    Parathyroid abnormality (HCC)    Parathyroid disease ([place])    Pneumonia 12/2015   Psoriasis    Renal insufficiency    Sickle cell trait ([place])    traits   Past Surgical History:  Procedure Laterality Date   ABDOMINAL HYSTERECTOMY  1980   APPENDECTOMY     BREAST BIOPSY Left 10/28/2013   benign   BREAST EXCISIONAL BIOPSY Left 2002   benign   CORONARY ANGIOGRAPHY N/A 04/04/2020   Procedure: CORONARY ANGIOGRAPHY;  Surgeon: Wellington Hampshire, MD;  Location: [place] [facility];  Service: Cardiovascular;  Laterality: N/A;   INSERTION OF DIALYSIS CATHETER  2014   LIPOMA EXCISION N/A 01/23/2016   Procedure:  EXCISION LIPOMA;  Surgeon: Hubbard Robinson, MD;  Location: [facility];  Service: General;  Laterality: N/A;   MASTECTOMY W/ SENTINEL NODE BIOPSY Bilateral 01/23/2016   Procedure: bilateral MASTECTOMY WITH  bilateral SENTINEL LYMPH NODE BIOPSY possible left axillary node dissection forehead lipoma removal;  Surgeon: Hubbard Robinson, MD;  Location: [facility];  Service: General;  Laterality: Bilateral;   PARTIAL  HYSTERECTOMY     PERIPHERAL VASCULAR CATHETERIZATION N/A 12/25/2015   Procedure: Dialysis/Perma Catheter Insertion;  Surgeon: Algernon Huxley, MD;  Location: [place] [facility];  Service: Cardiovascular;  Laterality: N/A;   PERIPHERAL VASCULAR CATHETERIZATION Left 01/22/2016   Procedure: Dialysis/Perma Catheter Insertion;  Surgeon: Algernon Huxley, MD;  Location: [place] [facility];  Service: Cardiovascular;  Laterality: Left;   PERIPHERAL VASCULAR CATHETERIZATION N/A 01/26/2016   Procedure: Dialysis/Perma Catheter Insertion;  Surgeon: Katha Cabal, MD;  Location: [place] [facility];  Service: Cardiovascular;  Laterality: N/A;   PORT-A-CATH REMOVAL N/A 12/20/2015   Procedure: REMOVAL PORT-A-CATH;  Surgeon: Hubbard Robinson, MD;  Location: [facility];  Service: General;  Laterality: N/A;  left     PORTACATH PLACEMENT Left 08/21/2015   Procedure: INSERTION PORT-A-CATH;  Surgeon: Hubbard Robinson, MD;  Location: [facility];  Service: General;  Laterality: Left;   REMOVAL OF A DIALYSIS CATHETER  2017   RENAL ANGIOGRAPHY Right 11/05/2021   Procedure: RENAL ANGIOGRAPHY;  Surgeon: Algernon Huxley, MD;  Location: [place] [facility];  Service: Cardiovascular;  Laterality: Right;  with embolization   RIGHT HEART CATH N/A 04/04/2020   Procedure: RIGHT HEART CATH;  Surgeon: Wellington Hampshire, MD;  Location: [place] [facility];  Service: Cardiovascular;  Laterality: N/A;   Social History:  reports that she has never smoked. She has never used smokeless tobacco. She reports that she does not drink alcohol and does not use drugs.  Allergies  Allergen Reactions   Gabapentin Other (See Comments)    Seizure   Adhesive [Tape] Itching    Silk tape is ok to use.    Family History  Problem Relation Age of Onset   Stroke Mother    CVA Mother    Hypertension Mother    Hypertension Father    Hypertension Sister    Diabetes Sister    Cancer Sister 79       Breast   Stroke Brother    Hypertension Brother     Breast cancer Maternal Aunt 70    Prior to Admission medications   Medication Sig Start Date End Date Taking? Authorizing Provider  ALPRAZolam (XANAX) 0.25 MG tablet Take 1 tablet (0.25 mg total) by mouth 2 (two) times daily as needed for anxiety. 11/22/21   Fritzi Mandes, MD  amiodarone (PACERONE) 200 MG tablet Take 1 tablet (200 mg total) by mouth 2 (two) times daily. 11/22/21   Fritzi Mandes, MD  b complex-vitamin c-folic acid (NEPHRO-VITE) 0.8 MG TABS tablet Take 1 tablet by mouth daily.    [provider]  calcium carbonate (TUMS - DOSED IN MG ELEMENTAL CALCIUM) 500 MG chewable tablet Chew 1 tablet (200 mg of elemental calcium total) by mouth 2 (two) times daily as needed for indigestion or heartburn. 07/30/21   Loletha Grayer, MD  carvedilol (COREG) 12.5 MG tablet Take 1 tablet (12.5 mg total) by mouth 2 (two) times daily. 07/30/21 08/29/21  Loletha Grayer, MD  cinacalcet (SENSIPAR) 60 MG tablet Take 60 mg by mouth daily before lunch.    [provider]  hydrALAZINE (APRESOLINE) 25 MG tablet Take 1 tablet (25 mg total) by mouth 3 (three) times daily. 07/30/21   Wieting, Richard, MD  lidocaine (LIDODERM) 5 % Place 2 patches onto the skin daily. Remove & Discard patch within 12 hours or as directed by MD 11/22/21   Fritzi Mandes, MD  oxyCODONE (OXY IR/ROXICODONE) 5 MG immediate release tablet Take 1 tablet (5 mg total) by mouth every 6 (six) hours as needed for moderate pain. 11/22/21   Fritzi Mandes, MD  pantoprazole (PROTONIX) 40 MG tablet Take 1 tablet (40 mg total) by mouth 2 (two) times daily. 07/30/21   Loletha Grayer, MD  polyethylene glycol (MIRALAX / GLYCOLAX) 17 g packet Take 17 g by mouth daily as needed for mild constipation. 11/22/21   Fritzi Mandes, MD  sacubitril-valsartan (ENTRESTO) 24-26 MG Take 1 tablet by mouth 2 (two) times daily. 02/15/21   Kate Sable, MD  sertraline (ZOLOFT) 100 MG tablet Take 100 mg by mouth daily.    [provider]  sevelamer  carbonate (RENVELA) 800 MG tablet Take 3 tablets (2,400 mg total) by mouth with breakfast, with lunch, and with evening meal. One with snacks 07/30/21   Loletha Grayer, MD  traZODone (DESYREL) 50 MG tablet Take 50 mg by mouth at bedtime. 03/01/21   [provider]  zolpidem (AMBIEN) 10 MG tablet Take 0.5 tablets (5 mg total) by mouth at bedtime. 07/30/21   Loletha Grayer, MD    Physical Exam: Vitals:   11/30/21 1330 11/30/21 1400 11/30/21 1413 11/30/21 1437  BP: 118/74 (!) 165/88  (!) 165/88  Pulse: 67 78 76 78  Resp: 18 17 (!) 22 16  Temp:    98.9 F (37.2 C)  TempSrc:    Oral  SpO2: 97% 100% 100% 99%  Weight:    65 kg  Height:    '5\' 5"'$  (1.651 m)   Physical Exam Vitals and nursing note reviewed.  Constitutional:      Comments: Thin and frail  HENT:     Head: Normocephalic and atraumatic.     Nose: Nose normal.     Mouth/Throat:     Mouth: Mucous membranes are moist.  Eyes:     Comments: Pale conjunctiva  Cardiovascular:     Rate and Rhythm: Normal rate and regular rhythm.  Pulmonary:     Effort: Pulmonary effort is normal.     Breath sounds: Normal breath sounds.  Abdominal:     General: Abdomen is flat. Bowel sounds are normal.     Palpations: Abdomen is soft.     Comments: Right flank pain  Musculoskeletal:        General: Swelling present. Normal range of motion.     Cervical back: Normal range of motion and neck supple.     Comments: Right lower extremity swelling  Skin:    General: Skin is warm and dry.  Neurological:     General: No focal deficit present.     Mental Status: She is alert and oriented to person, place, and time.  Psychiatric:        Mood and Affect: Mood normal.        Behavior: Behavior normal.     Data Reviewed: Relevant notes from primary care and specialist visits, past discharge summaries as available in EHR, including Care Everywhere. Prior diagnostic testing as pertinent to current admission diagnoses Updated medications  and problem lists for reconciliation ED course, including vitals, labs, imaging, treatment and response to treatment Triage notes, nursing  and pharmacy notes and ED provider's notes Notable results as noted in HPI Labs reviewed.  Sodium 143, potassium 3.5, chloride 106, bicarb 23, glucose 100, BUN 30, creatinine 8.35, calcium 10.7, troponin 11, magnesium 2.2, white count 10.8, hemoglobin 10.7, hematocrit 33.6, platelet count 219 Chest x-ray reviewed by me shows no evidence of cardiopulmonary disease Twelve-lead EKG reviewed by me shows sinus rhythm, LVH and T wave inversions in the lateral leads There are no new results to review at this time.  Assessment and Plan: * ESRD on hemodialysis [facility]) Dialysis days are M/W/F Patient was sent to the ER from the dialysis center because her access Clotted off. We will request nephrology consult for renal replacement therapy  Deep venous thrombosis (DVT) of right peroneal vein ([place]) Patient with a history of VTE who was on chronic anticoagulation with Coumadin and was recently hospitalized for right renal artery hemorrhage in the setting of a supratherapeutic INR. Patient's anticoagulation was placed on hold on discharge and now she has  an occlusive thrombus in the greater saphenous vein and peroneal vein. Nonocclusive thrombus at the saphenofemoral junction. Discussed with vascular surgery and will start patient on a heparin drip.  Hemoglobin today on admission is 10.7 compared to 7.9 on discharge Monitor H&H closely  Hypercalcemia Probably iatrogenic Hold TUMS Monitor calcium levels  Chronic diastolic CHF (congestive heart failure) (HCC) Stable and not acutely exacerbated Continue carvedilol, Entresto and hydralazine Renal replacement therapy to manage volume status  PAF (paroxysmal atrial fibrillation) (HCC) Continue amiodarone and carvedilol for rate control We will place patient on IV heparin for stroke prophylaxis  Moderate recurrent  major depression (HCC) Continue sertraline and trazodone  Hypertension Blood pressure is stable Continue carvedilol and hydralazine      Advance Care Planning:   Code Status: Full Code   Consults: Nephrology  Family Communication: Greater than 50% of time was spent discussing patient's condition and plan of care with her and her daughter at the bedside.  All questions and concerns have been addressed.  They verbalized understanding and agree with the plan  Severity of Illness: The appropriate patient status for this patient is OBSERVATION. Observation status is judged to be reasonable and necessary in order to provide the required intensity of service to ensure the patient's safety. The patient's presenting symptoms, physical exam findings, and initial radiographic and laboratory data in the context of their medical condition is felt to place them at decreased risk for further clinical deterioration. Furthermore, it is anticipated that the patient will be medically stable for discharge from the hospital within 2 midnights of admission.   Author: Collier Bullock, MD 11/30/2021 2:47 PM  For on call review [email or web address].

## 2021-11-30 NOTE — Progress Notes (Signed)
Pt completed 2.9 of 3 hour HD treatment. HD tx ended 10 minutes early d/t high vp alarms. Pt alert, no c/o, stable, report to ED RN.  Start: End: 1830 1846m fluid removed 51L BVP UTA post hd weight d/t ED stretcher No HD meds ordered

## 2021-11-30 NOTE — ED Notes (Signed)
Pt in bed, pt reports that she was at dialysis today, states that she is MWF dialysis, states that she was clotting off during her dialysis treatment.  Pt states that she has a hx of clotting disorder.  Pt also reports that she thinks that she might have a clot in her R leg. Pt's R calf is bigger than her L, skin is cool and dry,

## 2021-11-30 NOTE — Assessment & Plan Note (Signed)
Continue sertraline and trazodone 

## 2021-11-30 NOTE — Assessment & Plan Note (Signed)
Dialysis days are M/W/F Patient was sent to the ER from the dialysis center because her access Clotted off. We will request nephrology consult for renal replacement therapy

## 2021-11-30 NOTE — ED Provider Notes (Signed)
[facility] Provider Note    Event Date/Time   First MD Initiated Contact with Patient 11/30/21 1014     (approximate)   History   No chief complaint on file.   HPI  Teresa Cook is a 66 y.o. female who presents to the emergency department with inability to do dialysis.  Has a history of ESRD, atrial fibrillation, history of DVT, recent renal artery bleed that required embolism, who presents to the emergency department for difficulty to do dialysis today.  Patient states that she was discharged from the hospital 1 week ago after she had a significant bleed of her renal artery.  Required embolus.  States that she has had ongoing bleeding from her urethra since that time but does not make any urine.  Was told to hold her Eliquis which she has been doing since she was discharged from the hospital.  Has followed up with hemodialysis and they have had difficulty with her blood clotting and doing dialysis and today was unable to do dialysis.  No difficulty getting the blood out of the PermCath but states that it has difficulty with clotting in the machine.  Also complaining of swelling to her right lower leg.     Physical Exam   Triage Vital Signs: ED Triage Vitals  Enc Vitals Group     BP 11/30/21 0936 (!) 193/106     Pulse Rate 11/30/21 0936 80     Resp 11/30/21 0936 18     Temp 11/30/21 0936 98.5 F (36.9 C)     Temp Source 11/30/21 0936 Oral     SpO2 11/30/21 0936 96 %     Weight --      Height --      Head Circumference --      Peak Flow --      Pain Score 11/30/21 0937 4     Pain Loc --      Pain Edu? --      Excl. in [place]? --     Most recent vital signs: Vitals:   11/30/21 1530 11/30/21 1600  BP: (!) 181/85 (!) 159/91  Pulse: 68 71  Resp: 20 20  Temp:    SpO2: 100% 100%    Physical Exam Constitutional:      Appearance: She is well-developed.  HENT:     Head: Atraumatic.  Eyes:     Conjunctiva/sclera: Conjunctivae normal.   Cardiovascular:     Rate and Rhythm: Regular rhythm.     Comments: PermCath to the left chest wall Pulmonary:     Effort: No respiratory distress.  Abdominal:     General: There is no distension.  Musculoskeletal:        General: Swelling present. No tenderness. Normal range of motion.     Cervical back: Normal range of motion.     Right lower leg: Edema present.     Left lower leg: No edema.  Skin:    General: Skin is warm.  Neurological:     Mental Status: She is alert. Mental status is at baseline.          IMPRESSION / MDM / ASSESSMENT AND PLAN / ED COURSE  I reviewed the triage vital signs and the nursing notes.  Differential diagnosis including DVT, PE, anemia, CKD, need for emergent dialysis  High risk Wells criteria so will obtain ultrasound duplex  On chart review patient had a recent hospitalization was discharged 1 week ago following an embolus of the  renal artery from hemorrhagic shock  EKG  No tachycardic or bradycardic dysrhythmias while on cardiac telemetry  RADIOLOGY I independently reviewed imaging, my interpretation of imaging: Chest x-ray without focal findings consistent with pneumonia.  Ultrasound DVT showed occlusive DVT.  Read as an occlusive DVT      ED Results / Procedures / Treatments   Labs (all labs ordered are listed, but only abnormal results are displayed) Labs interpreted as -  Hemoglobin has improved when compared to her baseline.  Labs Reviewed  BASIC METABOLIC PANEL - Abnormal; Notable for the following components:      Result Value   Glucose, Bld 100 (*)    BUN 30 (*)    Creatinine, Ser 8.35 (*)    Calcium 10.7 (*)    GFR, Estimated 5 (*)    All other components within normal limits  CBC - Abnormal; Notable for the following components:   WBC 10.8 (*)    Hemoglobin 10.7 (*)    HCT 33.6 (*)    RDW 19.7 (*)    All other components within normal limits  PROTIME-INR - Abnormal; Notable for the following components:    Prothrombin Time 16.1 (*)    INR 1.3 (*)    All other components within normal limits  MAGNESIUM  HEPATITIS B CORE ANTIBODY, IGM  HEPATITIS B SURFACE ANTIGEN  HEPATITIS B CORE ANTIBODY, TOTAL  HEPATITIS B SURFACE ANTIBODY, QUANTITATIVE  APTT  APTT  HEPARIN LEVEL (UNFRACTIONATED)  TYPE AND SCREEN  TROPONIN I (HIGH SENSITIVITY)   Consulted and discussed the patient's case with nephrology Dr. Theador Hawthorne, patient does not meet criteria for emergent dialysis.  Recommended reaching out to cardiology and vascular surgery to determine whether she can be restarted back on anticoagulation.  Discussed the patient's case with cardiologist Dr. Nehemiah Massed, from their standpoint patient can be restarted on anticoagulation if no concern for bleed.  Consulted the hospitalist and discussed with Dr. Francine Graven, will restart the patient on anticoagulation and admit for hemodialysis and acute DVT   PROCEDURES:  Critical Care performed: No  Procedures  Patient's presentation is most consistent with acute presentation with potential threat to life or bodily function.   MEDICATIONS ORDERED IN ED: Medications  Chlorhexidine Gluconate Cloth 2 % PADS 6 each (has no administration in time range)  pentafluoroprop-tetrafluoroeth (GEBAUERS) aerosol 1 Application (has no administration in time range)  lidocaine (PF) (XYLOCAINE) 1 % injection 5 mL (has no administration in time range)  lidocaine-prilocaine (EMLA) cream 1 Application (has no administration in time range)  heparin injection 1,000 Units (has no administration in time range)  anticoagulant sodium citrate solution 5 mL (has no administration in time range)  alteplase (CATHFLO ACTIVASE) injection 2 mg (has no administration in time range)  oxyCODONE (Oxy IR/ROXICODONE) immediate release tablet 5 mg (has no administration in time range)  amiodarone (PACERONE) tablet 200 mg (has no administration in time range)  carvedilol (COREG) tablet 12.5 mg (has no  administration in time range)  hydrALAZINE (APRESOLINE) tablet 25 mg (has no administration in time range)  sacubitril-valsartan (ENTRESTO) 24-26 mg per tablet (has no administration in time range)  ALPRAZolam (XANAX) tablet 0.25 mg (has no administration in time range)  sertraline (ZOLOFT) tablet 100 mg (has no administration in time range)  traZODone (DESYREL) tablet 50 mg (has no administration in time range)  zolpidem (AMBIEN) tablet 5 mg (has no administration in time range)  pantoprazole (PROTONIX) EC tablet 40 mg (has no administration in time range)  polyethylene glycol (MIRALAX /  GLYCOLAX) packet 17 g (has no administration in time range)  sevelamer carbonate (RENVELA) tablet 2,400 mg (has no administration in time range)  multivitamin (RENA-VIT) tablet 1 tablet (has no administration in time range)  sodium chloride flush (NS) 0.9 % injection 3 mL (has no administration in time range)  sodium chloride flush (NS) 0.9 % injection 3 mL (has no administration in time range)  0.9 %  sodium chloride infusion (has no administration in time range)  acetaminophen (TYLENOL) tablet 650 mg (has no administration in time range)    Or  acetaminophen (TYLENOL) suppository 650 mg (has no administration in time range)  ondansetron (ZOFRAN) tablet 4 mg (has no administration in time range)    Or  ondansetron (ZOFRAN) injection 4 mg (has no administration in time range)  heparin bolus via infusion 4,600 Units (has no administration in time range)  heparin ADULT infusion 100 units/mL (25000 units/268m) (has no administration in time range)    FINAL CLINICAL IMPRESSION(S) / ED DIAGNOSES   Final diagnoses:  Acute deep vein thrombosis (DVT) of proximal vein of right lower extremity ([facility]     Rx / DC Orders   ED Discharge Orders     None        Note:  This document was prepared using Dragon voice recognition software and may include unintentional dictation errors.   MNathaniel Man  MD 11/30/21 1609

## 2021-11-30 NOTE — ED Triage Notes (Signed)
Pt to ED via POV from home. Pt reports unable to get dialysis today due to blood clots in access. Pt recently discharged from hospital and taken off blood thinners 10/30. Pt gets dialysis MWF. Last treatment on Wednesday. Pt also reports chest tightness and SOB. Pt reports minimal urine output but states has had dark blood in urine since she was discharged.

## 2021-11-30 NOTE — Assessment & Plan Note (Signed)
Blood pressure is stable Continue carvedilol and hydralazine

## 2021-11-30 NOTE — Progress Notes (Signed)
Pre hd rn assessment 

## 2021-11-30 NOTE — ED Notes (Signed)
Pt in bed, family at bedside, pt offers no complaints at this time, resps even and unlabored

## 2021-11-30 NOTE — Consult Note (Signed)
ANTICOAGULATION CONSULT NOTE - Initial Consult  Pharmacy Consult for heparin infusion Indication: DVT  Allergies  Allergen Reactions   Gabapentin Other (See Comments)    Seizure   Adhesive [Tape] Itching    Silk tape is ok to use.    Patient Measurements: Height: '5\' 5"'$  (165.1 cm) Weight: 55 kg (121 lb 4.1 oz) IBW/kg (Calculated) : 57 Heparin Dosing Weight: 65 kg  Vital Signs: Temp: 99.3 F (37.4 C) (11/24 2257) Temp Source: Oral (11/24 2257) BP: 115/75 (11/24 2257) Pulse Rate: 81 (11/24 2257)  Labs: Recent Labs    11/30/21 0941  HGB 10.7*  HCT 33.6*  PLT 219  LABPROT 16.1*  INR 1.3*  CREATININE 8.35*  TROPONINIHS 11     Estimated Creatinine Clearance: 5.7 mL/min (A) (by C-G formula based on SCr of 8.35 mg/dL (H)).   Medical History: Past Medical History:  Diagnosis Date   Anemia    Anxiety    Breast cancer ([place]) 01/2016   bilateral   CHF (congestive heart failure) (HCC)    Chronic kidney disease    Depression    Dialysis patient [facility])    DVT (deep venous thrombosis) (HCC)    left leg   DVT (deep venous thrombosis) ([place]) 1985   right thigh   Dysrhythmia    Gout    Headache    HTN (hypertension)    Hypertension    Parathyroid abnormality (HCC)    Parathyroid disease ([place])    Pneumonia 12/2015   Psoriasis    Renal insufficiency    Sickle cell trait (HCC)    traits    Medications:  PTA: Eliquis 2.'5mg'$  BID  Inpatient: Heparin infusion (11/24 > )  Allergies: No AC/APT related allergies  Assessment: 66 year old female with PMH hypertension, pAfib on Eliquis, CHF, and ESRD on HD MWF presents to ED with complaints of chest tightness and SOB. Patient was reportedely unable to get dialysis Wednesday due to blood clots in dialysis port. Also Eliquis was reportedly discontinued on 10/30 but received Eliquis during recent hospital admission from 11/9 through 11/15. Will check confirmatory heparin level to check this. Pharmacy consulted for management of  heparin infusion in the setting of DVT.  Date Time aPTT/HL Rate/Comment   Goal of Therapy:  Heparin level 0.3-0.7 units/ml aPTT 66-102 seconds Monitor platelets by anticoagulation protocol: Yes  Plan:  Order STAT heparin level and aPTT Give 4600 units bolus x1; then start heparin infusion at 1100 units/hr Check aPTT/Anti-Xa level in 8 hours and daily once consecutively therapeutic.  Titrate by aPTT's until lab correlation is noted, then titrate by anti-xa alone. Continue to monitor H&H and platelets daily while on heparin gtt.  -  NOTE:  Pt refused heparin initially,  heparin bolus and gtt started late on 11/24 @ 2348.  Will retime aPTT and HL to be drawn 8 hrs after start of drip.   Leshawn Houseworth D Clinical Pharmacist 11/30/2021 11:59 PM

## 2021-11-30 NOTE — Consult Note (Signed)
ANTICOAGULATION CONSULT NOTE - Initial Consult  Pharmacy Consult for heparin infusion Indication: DVT  Allergies  Allergen Reactions   Gabapentin Other (See Comments)    Seizure   Adhesive [Tape] Itching    Silk tape is ok to use.    Patient Measurements: Height: '5\' 5"'$  (165.1 cm) Weight: 65 kg (143 lb 4.8 oz) IBW/kg (Calculated) : 57 Heparin Dosing Weight: 65 kg  Vital Signs: Temp: 98.9 F (37.2 C) (11/24 1437) Temp Source: Oral (11/24 1437) BP: 165/88 (11/24 1437) Pulse Rate: 78 (11/24 1437)  Labs: Recent Labs    11/30/21 0941  HGB 10.7*  HCT 33.6*  PLT 219  LABPROT 16.1*  INR 1.3*  CREATININE 8.35*  TROPONINIHS 11    Estimated Creatinine Clearance: 5.9 mL/min (A) (by C-G formula based on SCr of 8.35 mg/dL (H)).   Medical History: Past Medical History:  Diagnosis Date   Anemia    Anxiety    Breast cancer ([place]) 01/2016   bilateral   CHF (congestive heart failure) (HCC)    Chronic kidney disease    Depression    Dialysis patient [facility])    DVT (deep venous thrombosis) (HCC)    left leg   DVT (deep venous thrombosis) ([place]) 1985   right thigh   Dysrhythmia    Gout    Headache    HTN (hypertension)    Hypertension    Parathyroid abnormality (HCC)    Parathyroid disease ([place])    Pneumonia 12/2015   Psoriasis    Renal insufficiency    Sickle cell trait (HCC)    traits    Medications:  PTA: Eliquis 2.'5mg'$  BID  Inpatient: Heparin infusion (11/24 > )  Allergies: No AC/APT related allergies  Assessment: 66 year old female with PMH hypertension, pAfib on Eliquis, CHF, and ESRD on HD MWF presents to ED with complaints of chest tightness and SOB. Patient was reportedely unable to get dialysis Wednesday due to blood clots in dialysis port. Also Eliquis was reportedly discontinued on 10/30 but received Eliquis during recent hospital admission from 11/9 through 11/15. Will check confirmatory heparin level to check this. Pharmacy consulted for management of  heparin infusion in the setting of DVT.  Date Time aPTT/HL Rate/Comment   Goal of Therapy:  Heparin level 0.3-0.7 units/ml aPTT 66-102 seconds Monitor platelets by anticoagulation protocol: Yes  Plan:  Order STAT heparin level and aPTT Give 4600 units bolus x1; then start heparin infusion at 1100 units/hr Check aPTT/Anti-Xa level in 8 hours and daily once consecutively therapeutic.  Titrate by aPTT's until lab correlation is noted, then titrate by anti-xa alone. Continue to monitor H&H and platelets daily while on heparin gtt.  Darrick Penna Clinical Pharmacist 11/30/2021 2:54 PM

## 2021-11-30 NOTE — Assessment & Plan Note (Signed)
Probably iatrogenic Hold TUMS Monitor calcium levels

## 2021-11-30 NOTE — Progress Notes (Signed)
[facility]  ROUNDING NOTE   Subjective:   Teresa Cook is a 66 year old African-American female with past medical conditions including hypertension, gout, anxiety with depression, sickle cell trait, systolic heart failure with a EF 40 to 45%, breast cancer with bilateral mastectomy, seizures, and end-stage renal disease on hemodialysis.  Patient presents to the emergency department via EMS for multiple syncopal episodes.  Patient has been admitted for Deep venous thrombosis (DVT) of right peroneal vein [facility]) [I82.451]  Patient is known to our practice and receives outpatient dialysis treatments at [facility] on a MWF schedule, supervised by Dr. Candiss Norse.   Nephrology was consulted for comanagement of dialysis patient. Patient informed me that her last  dialysis session was this week Tuesday, patient has not had dialysis secondary to it being at Thanksgiving week. Patient offers no complaint of chest pain-no complaint of fever/cough/chills No complaint of abdominal pain. No complaint of change in speech or change in vision     Objective:  Vital signs in last 24 hours:  Temp:  [98.5 F (36.9 C)] 98.5 F (36.9 C) (11/24 0936) Pulse Rate:  [68-80] 76 (11/24 1413) Resp:  [17-22] 22 (11/24 1413) BP: (139-193)/(79-106) 139/79 (11/24 1130) SpO2:  [96 %-100 %] 100 % (11/24 1413)  Weight change:  There were no vitals filed for this visit.   Intake/Output: No intake/output data recorded.   Intake/Output this shift:  No intake/output data recorded.  Physical Exam: General: NAD  Head: Normocephalic, atraumatic. Moist oral mucosal membranes  Eyes: Anicteric  Lungs:  Clear to auscultation, normal effort, room air  Heart: Regular rate and rhythm  Abdomen:  Soft, tender, BS present  Extremities:  No peripheral edema.  Neurologic: Nonfocal, moving all four extremities  Skin: No lesions  Access: Lt Permcath    Basic Metabolic Panel: Recent Labs  Lab  11/30/21 0941  NA 143  K 3.5  CL 106  CO2 23  GLUCOSE 100*  BUN 30*  CREATININE 8.35*  CALCIUM 10.7*  MG 2.2    Liver Function Tests: No results for input(s): "AST", "ALT", "ALKPHOS", "BILITOT", "PROT", "ALBUMIN" in the last 168 hours. No results for input(s): "LIPASE", "AMYLASE" in the last 168 hours. No results for input(s): "AMMONIA" in the last 168 hours.  CBC: Recent Labs  Lab 11/30/21 0941  WBC 10.8*  HGB 10.7*  HCT 33.6*  MCV 86.6  PLT 219    Cardiac Enzymes: No results for input(s): "CKTOTAL", "CKMB", "CKMBINDEX", "TROPONINI" in the last 168 hours.  BNP: Invalid input(s): "POCBNP"  CBG: No results for input(s): "GLUCAP" in the last 168 hours.   Microbiology: Results for orders placed or performed during the hospital encounter of 11/05/21  Blood Culture (routine x 2)     Status: None   Collection Time: 11/05/21  3:20 AM   Specimen: BLOOD  Result Value Ref Range Status   Specimen Description BLOOD RIGHT FA  Final   Special Requests   Final    BOTTLES DRAWN AEROBIC AND ANAEROBIC Blood Culture results may not be optimal due to an inadequate volume of blood received in culture bottles   Culture   Final    NO GROWTH 5 DAYS Performed at [facility], [address], [place], [place] [address]    Report Status 11/10/2021 FINAL  Final  SARS Coronavirus 2 by RT PCR (hospital order, performed in [facility] hospital lab) *cepheid single result test* Anterior Nasal Swab     Status: None   Collection Time: 11/05/21  3:20 AM   Specimen: Anterior Nasal Swab  Result Value Ref Range Status   SARS Coronavirus 2 by RT PCR NEGATIVE NEGATIVE Final    Comment: (NOTE) SARS-CoV-2 target nucleic acids are NOT DETECTED.  The SARS-CoV-2 RNA is generally detectable in upper and lower respiratory specimens during the acute phase of infection. The lowest concentration of SARS-CoV-2 viral copies this assay can detect is 250 copies / mL. A negative result does not  preclude SARS-CoV-2 infection and should not be used as the sole basis for treatment or other patient management decisions.  A negative result may occur with improper specimen collection / handling, submission of specimen other than nasopharyngeal swab, presence of viral mutation(s) within the areas targeted by this assay, and inadequate number of viral copies (<250 copies / mL). A negative result must be combined with clinical observations, patient history, and epidemiological information.  Fact Sheet for Patients:   https://www.patel.info/  Fact Sheet for Healthcare Providers: https://hall.com/  This test is not yet approved or  cleared by the [place] FDA and has been authorized for detection and/or diagnosis of SARS-CoV-2 by FDA under an Emergency Use Authorization (EUA).  This EUA will remain in effect (meaning this test can be used) for the duration of the COVID-19 declaration under Section 564(b)(1) of the Act, 21 U.S.C. section 360bbb-3(b)(1), unless the authorization is terminated or revoked sooner.  Performed at [facility], [address] [place], [place] [address]   Blood Culture (routine x 2)     Status: None   Collection Time: 11/05/21  3:21 AM   Specimen: BLOOD  Result Value Ref Range Status   Specimen Description BLOOD RIGHT HAND  Final   Special Requests   Final    BOTTLES DRAWN AEROBIC AND ANAEROBIC Blood Culture results may not be optimal due to an inadequate volume of blood received in culture bottles   Culture   Final    NO GROWTH 5 DAYS Performed at [facility], [address], [place], [place] [address]    Report Status 11/10/2021 FINAL  Final  MRSA Next Gen by PCR, Nasal     Status: None   Collection Time: 11/05/21  6:57 AM   Specimen: Nasal Mucosa; Nasal Swab  Result Value Ref Range Status   MRSA by PCR Next Gen NOT DETECTED NOT DETECTED Final    Comment: (NOTE) The GeneXpert  MRSA Assay (FDA approved for NASAL specimens only), is one component of a comprehensive MRSA colonization surveillance program. It is not intended to diagnose MRSA infection nor to guide or monitor treatment for MRSA infections. Test performance is not FDA approved in patients less than 17 years old. Performed at [facility], [address] [place], [place] [address]     Coagulation Studies: Recent Labs    11/30/21 0941  LABPROT 16.1*  INR 1.3*    Urinalysis: No results for input(s): "COLORURINE", "LABSPEC", "PHURINE", "GLUCOSEU", "HGBUR", "BILIRUBINUR", "KETONESUR", "PROTEINUR", "UROBILINOGEN", "NITRITE", "LEUKOCYTESUR" in the last 72 hours.  Invalid input(s): "APPERANCEUR"     Imaging: US Venous Img Lower Right (DVT Study)  Result Date: 11/30/2021 CLINICAL DATA:  Right lower extremity pain. EXAM: Right LOWER EXTREMITY VENOUS DOPPLER ULTRASOUND TECHNIQUE: Gray-scale sonography with graded compression, as well as color Doppler and duplex ultrasound were performed to evaluate the lower extremity deep venous systems from the level of the common femoral vein and including the common femoral, femoral, profunda femoral, popliteal and calf veins including the posterior tibial, peroneal and gastrocnemius veins  when visible. The superficial great saphenous vein was also interrogated. Spectral Doppler was utilized to evaluate flow at rest and with distal augmentation maneuvers in the common femoral, femoral and popliteal veins. COMPARISON:  None Available. FINDINGS: Contralateral Common Femoral Vein: Respiratory phasicity is normal and symmetric with the symptomatic side. No evidence of thrombus. Normal compressibility. Common Femoral Vein: No evidence of thrombus. Normal compressibility, respiratory phasicity and response to augmentation. Saphenofemoral Junction: Nonocclusive thrombus is noted. Profunda Femoral Vein: No evidence of thrombus. Normal compressibility and flow on color  Doppler imaging. Femoral Vein: No evidence of thrombus. Normal compressibility, respiratory phasicity and response to augmentation. Popliteal Vein: No evidence of thrombus. Normal compressibility, respiratory phasicity and response to augmentation. Calf Veins: Occlusive thrombus in the peroneal vein. Superficial Great Saphenous Vein: Occlusive thrombus. Venous Reflux:  None. Other Findings:  None. IMPRESSION: 1. Occlusive thrombus in the greater saphenous vein and peroneal vein. 2. Nonocclusive thrombus at the saphenofemoral junction. These results will be called to the ordering clinician or representative by the Radiologist Assistant, and communication documented in the PACS or [employer]. Electronically Signed   By: Marijo Sanes M.D.   On: 11/30/2021 13:09   DG Chest 2 View  Result Date: 11/30/2021 CLINICAL DATA:  Chest pain. EXAM: CHEST - 2 VIEW COMPARISON:  November 22, 2021. FINDINGS: The heart size and mediastinal contours are within normal limits. Left internal jugular dialysis catheter is unchanged in position. Both lungs are clear. The visualized skeletal structures are unremarkable. IMPRESSION: No active cardiopulmonary disease. Aortic Atherosclerosis (ICD10-I70.0). Electronically Signed   By: Marijo Conception M.D.   On: 11/30/2021 10:01     Medications:    sodium chloride     anticoagulant sodium citrate      amiodarone  200 mg Oral BID   carvedilol  12.5 mg Oral BID   [START ON 12/01/2021] Chlorhexidine Gluconate Cloth  6 each Topical Q0600   hydrALAZINE  25 mg Oral TID   multivitamin  1 tablet Oral QHS   pantoprazole  40 mg Oral BID   sacubitril-valsartan  1 tablet Oral BID   sertraline  100 mg Oral Daily   sevelamer carbonate  2,400 mg Oral TID with meals   sodium chloride flush  3 mL Intravenous Q12H   traZODone  50 mg Oral QHS   zolpidem  5 mg Oral QHS   sodium chloride, acetaminophen **OR** acetaminophen, ALPRAZolam, alteplase, anticoagulant sodium citrate, heparin,  lidocaine (PF), lidocaine-prilocaine, ondansetron **OR** ondansetron (ZOFRAN) IV, oxyCODONE, pentafluoroprop-tetrafluoroeth, polyethylene glycol, sodium chloride flush  Assessment/ Plan:  Ms. Malini Flemings is a 66 y.o.  female is a 66 year old African-American female with past medical conditions including hypertension, gout, anxiety with depression, sickle cell trait, systolic heart failure with a EF 40 to 45%, breast cancer with bilateral mastectomy, seizures, and end-stage renal disease on hemodialysis.  Patient presents to the emergency department via EMS for multiple syncopal episodes.  Patient has been admitted for Deep venous thrombosis (DVT) of right peroneal vein (HCC) [I82.451]  [facility]/M[facility]/Lt Permcath  End-stage renal disease on hemodialysis.   Patient will be dialyzed today as patient has not had renal placement therapy since Tuesday   2. Anemia of chronic kidney disease  Lab Results  Component Value Date   HGB 10.7 (L) 11/30/2021  During last admission patient had severe hemorrhage in the right kidney requiring coil embolization. Patient anticoagulants were thereafter stopped with the plan to have an outpatient follow-up with cardiology before her anticoagulants could be  restarted   3. Secondary Hyperparathyroidism: with outpatient labs: PTH 379, phosphorus 5.7, calcium 9.4 on June 18, 2021.   Lab Results  Component Value Date   PTH 214 (H) 07/08/2021   CALCIUM 10.7 (H) 11/30/2021   PHOS 2.9 11/22/2021  Bone minerals remain acceptable.  Continue sevelamer with all meals.  4.  Hypertension Blood pressure stable  5.  .Atrial fibrillation now admitted with DVT Patient has history of atrial fibrillation, and now is being admitted with DVT Patient does need anticoagulants, patient currently on heparin    Addendum Patient was seen on dialysis Patient tolerating treatment well   LOS: 0 Dailee Manalang s Daris Aristizabal 11/24/20232:37 PM

## 2021-11-30 NOTE — Progress Notes (Signed)
Post hd rn assessment 

## 2021-12-01 DIAGNOSIS — D631 Anemia in chronic kidney disease: Secondary | ICD-10-CM | POA: Diagnosis present

## 2021-12-01 DIAGNOSIS — G40909 Epilepsy, unspecified, not intractable, without status epilepticus: Secondary | ICD-10-CM | POA: Diagnosis present

## 2021-12-01 DIAGNOSIS — I82491 Acute embolism and thrombosis of other specified deep vein of right lower extremity: Secondary | ICD-10-CM | POA: Diagnosis not present

## 2021-12-01 DIAGNOSIS — F419 Anxiety disorder, unspecified: Secondary | ICD-10-CM | POA: Diagnosis present

## 2021-12-01 DIAGNOSIS — L89312 Pressure ulcer of right buttock, stage 2: Secondary | ICD-10-CM | POA: Diagnosis present

## 2021-12-01 DIAGNOSIS — N2581 Secondary hyperparathyroidism of renal origin: Secondary | ICD-10-CM | POA: Diagnosis present

## 2021-12-01 DIAGNOSIS — I82451 Acute embolism and thrombosis of right peroneal vein: Secondary | ICD-10-CM | POA: Diagnosis present

## 2021-12-01 DIAGNOSIS — R319 Hematuria, unspecified: Secondary | ICD-10-CM | POA: Diagnosis present

## 2021-12-01 DIAGNOSIS — L89322 Pressure ulcer of left buttock, stage 2: Secondary | ICD-10-CM | POA: Diagnosis present

## 2021-12-01 DIAGNOSIS — N368 Other specified disorders of urethra: Secondary | ICD-10-CM | POA: Diagnosis present

## 2021-12-01 DIAGNOSIS — I824Y1 Acute embolism and thrombosis of unspecified deep veins of right proximal lower extremity: Secondary | ICD-10-CM | POA: Diagnosis present

## 2021-12-01 DIAGNOSIS — Z7901 Long term (current) use of anticoagulants: Secondary | ICD-10-CM | POA: Diagnosis not present

## 2021-12-01 DIAGNOSIS — N186 End stage renal disease: Secondary | ICD-10-CM | POA: Diagnosis present

## 2021-12-01 DIAGNOSIS — I480 Paroxysmal atrial fibrillation: Secondary | ICD-10-CM | POA: Diagnosis present

## 2021-12-01 DIAGNOSIS — I5032 Chronic diastolic (congestive) heart failure: Secondary | ICD-10-CM | POA: Diagnosis present

## 2021-12-01 DIAGNOSIS — I10 Essential (primary) hypertension: Secondary | ICD-10-CM | POA: Diagnosis not present

## 2021-12-01 DIAGNOSIS — D6859 Other primary thrombophilia: Secondary | ICD-10-CM | POA: Diagnosis present

## 2021-12-01 DIAGNOSIS — I132 Hypertensive heart and chronic kidney disease with heart failure and with stage 5 chronic kidney disease, or end stage renal disease: Secondary | ICD-10-CM | POA: Diagnosis present

## 2021-12-01 DIAGNOSIS — Z87448 Personal history of other diseases of urinary system: Secondary | ICD-10-CM | POA: Diagnosis not present

## 2021-12-01 DIAGNOSIS — D573 Sickle-cell trait: Secondary | ICD-10-CM | POA: Diagnosis present

## 2021-12-01 DIAGNOSIS — Z992 Dependence on renal dialysis: Secondary | ICD-10-CM | POA: Diagnosis not present

## 2021-12-01 DIAGNOSIS — F331 Major depressive disorder, recurrent, moderate: Secondary | ICD-10-CM | POA: Diagnosis present

## 2021-12-01 DIAGNOSIS — I959 Hypotension, unspecified: Secondary | ICD-10-CM | POA: Diagnosis present

## 2021-12-01 DIAGNOSIS — Z9013 Acquired absence of bilateral breasts and nipples: Secondary | ICD-10-CM | POA: Diagnosis not present

## 2021-12-01 DIAGNOSIS — M109 Gout, unspecified: Secondary | ICD-10-CM | POA: Diagnosis present

## 2021-12-01 DIAGNOSIS — Z853 Personal history of malignant neoplasm of breast: Secondary | ICD-10-CM | POA: Diagnosis not present

## 2021-12-01 LAB — CBC
HCT: 27.4 % — ABNORMAL LOW (ref 36.0–46.0)
Hemoglobin: 8.9 g/dL — ABNORMAL LOW (ref 12.0–15.0)
MCH: 27.7 pg (ref 26.0–34.0)
MCHC: 32.5 g/dL (ref 30.0–36.0)
MCV: 85.4 fL (ref 80.0–100.0)
Platelets: 154 10*3/uL (ref 150–400)
RBC: 3.21 MIL/uL — ABNORMAL LOW (ref 3.87–5.11)
RDW: 19.2 % — ABNORMAL HIGH (ref 11.5–15.5)
WBC: 6.6 10*3/uL (ref 4.0–10.5)
nRBC: 0 % (ref 0.0–0.2)

## 2021-12-01 LAB — HEPARIN LEVEL (UNFRACTIONATED)
Heparin Unfractionated: <0.1 IU/mL — ABNORMAL LOW (ref 0.30–0.70)
Heparin Unfractionated: 0.51 IU/mL (ref 0.30–0.70)
Heparin Unfractionated: 0.51 IU/mL (ref 0.30–0.70)

## 2021-12-01 LAB — BASIC METABOLIC PANEL
Anion gap: 13 (ref 5–15)
BUN: 20 mg/dL (ref 8–23)
CO2: 25 mmol/L (ref 22–32)
Calcium: 10.1 mg/dL (ref 8.9–10.3)
Chloride: 102 mmol/L (ref 98–111)
Creatinine, Ser: 5.79 mg/dL — ABNORMAL HIGH (ref 0.44–1.00)
GFR, Estimated: 8 mL/min — ABNORMAL LOW (ref >60)
Glucose, Bld: 81 mg/dL (ref 70–99)
Potassium: 3.8 mmol/L (ref 3.5–5.1)
Sodium: 140 mmol/L (ref 135–145)

## 2021-12-01 LAB — APTT
aPTT: 30 seconds (ref 24–36)
aPTT: 87 seconds — ABNORMAL HIGH (ref 24–36)

## 2021-12-01 NOTE — Consult Note (Signed)
Vascular and Vein Specialist of [facility]  Patient name: Teresa Cook MRN: [id number] DOB: [birth year] Sex: female   REQUESTING PROVIDER:   Hospital service   REASON FOR CONSULT:    Venous thrombus  HISTORY OF PRESENT ILLNESS:   Teresa Cook is a 66 y.o. female, who presented to the emergency department yesterday from dialysis because she was unable to receive treatment because her catheter had clotted.  She was also complaining of pain in her right thigh and calf which led to a venous ultrasound that showed occlusive thrombus in the right greater saphenous vein and peroneal vein.  The patient was recently discharged from the hospital secondary to a retroperitoneal bleed in the setting of supratherapeutic INR.  She had coil embolization of a right renal artery.  She had been on anticoagulation for venous thromboembolic disease as well as atrial fibrillation.  She was not discharged on anticoagulation.  She does have a significant family history of clotting disorders.  She was able to dialyze successfully yesterday.  The patient has a history of breast cancer and is status post bilateral mastectomy.  She has a seizure disorder.  She is end-stage renal disease.  She is medically managed for hypertension.  She is a non-smoker.  She has a history of congestive heart failure on Entresto.  PAST MEDICAL HISTORY    Past Medical History:  Diagnosis Date   Anemia    Anxiety    Breast cancer ([place]) 01/2016   bilateral   CHF (congestive heart failure) (HCC)    Chronic kidney disease    Depression    Dialysis patient [facility])    DVT (deep venous thrombosis) ([place])    left leg   DVT (deep venous thrombosis) ([place]) 1985   right thigh   Dysrhythmia    Gout    Headache    HTN (hypertension)    Hypertension    Parathyroid abnormality (HCC)    Parathyroid disease (HCC)    Pneumonia 12/2015   Psoriasis    Renal insufficiency    Sickle cell trait (HCC)     traits     FAMILY HISTORY   Family History  Problem Relation Age of Onset   Stroke Mother    CVA Mother    Hypertension Mother    Hypertension Father    Hypertension Sister    Diabetes Sister    Cancer Sister 63       Breast   Stroke Brother    Hypertension Brother    Breast cancer Maternal Aunt 70    SOCIAL HISTORY:   Social History   Socioeconomic History   Marital status: Widowed    Spouse name: Not on file   Number of children: Not on file   Years of education: Not on file   Highest education level: Not on file  Occupational History   Occupation: disabled  Tobacco Use   Smoking status: Never   Smokeless tobacco: Never  Vaping Use   Vaping Use: Never used  Substance and Sexual Activity   Alcohol use: No    Alcohol/week: 0.0 standard drinks of alcohol   Drug use: No   Sexual activity: Never    Birth control/protection: Surgical  Other Topics Concern   Not on file  Social History Narrative   ** Merged History Encounter **       Social Determinants of Health   Financial Resource Strain: Not on file  Food Insecurity: No Food Insecurity (12/01/2021)   Hunger Vital Sign  Worried About Charity fundraiser in the Last Year: Never true    [place] in the Last Year: Never true  Transportation Needs: No Transportation Needs (12/01/2021)   PRAPARE - Hydrologist (Medical): No    Lack of Transportation (Non-Medical): No  Physical Activity: Not on file  Stress: Not on file  Social Connections: Not on file  Intimate Partner Violence: Not At Risk (12/01/2021)   Humiliation, Afraid, Rape, and Kick questionnaire    Fear of Current or Ex-Partner: No    Emotionally Abused: No    Physically Abused: No    Sexually Abused: No    ALLERGIES:    Allergies  Allergen Reactions   Gabapentin Other (See Comments)    Seizure   Adhesive [Tape] Itching    Silk tape is ok to use.    CURRENT MEDICATIONS:    Current  Facility-Administered Medications  Medication Dose Route Frequency Provider Last Rate Last Admin   0.9 %  sodium chloride infusion  250 mL Intravenous PRN Agbata, Tochukwu, MD       acetaminophen (TYLENOL) tablet 650 mg  650 mg Oral Q6H PRN Agbata, Tochukwu, MD       Or   acetaminophen (TYLENOL) suppository 650 mg  650 mg Rectal Q6H PRN Agbata, Tochukwu, MD       ALPRAZolam (XANAX) tablet 0.25 mg  0.25 mg Oral BID PRN Agbata, Tochukwu, MD       amiodarone (PACERONE) tablet 200 mg  200 mg Oral BID Agbata, Tochukwu, MD   200 mg at 12/01/21 1106   carvedilol (COREG) tablet 12.5 mg  12.5 mg Oral BID Agbata, Tochukwu, MD   12.5 mg at 12/01/21 1107   Chlorhexidine Gluconate Cloth 2 % PADS 6 each  6 each Topical Q0600 Liana Gerold, MD   6 each at 12/01/21 0512   heparin ADULT infusion 100 units/mL (25000 units/226m)  1,100 Units/hr Intravenous Continuous MDarrick Penna RPH 11 mL/hr at 12/01/21 0657 1,100 Units/hr at 12/01/21 04235  hydrALAZINE (APRESOLINE) tablet 25 mg  25 mg Oral TID ACollier Bullock MD   25 mg at 12/01/21 1106   multivitamin (RENA-VIT) tablet 1 tablet  1 tablet Oral QHS Agbata, Tochukwu, MD       ondansetron (ZOFRAN) tablet 4 mg  4 mg Oral Q6H PRN Agbata, Tochukwu, MD       Or   ondansetron (ZOFRAN) injection 4 mg  4 mg Intravenous Q6H PRN Agbata, Tochukwu, MD       oxyCODONE (Oxy IR/ROXICODONE) immediate release tablet 5 mg  5 mg Oral Q6H PRN Agbata, Tochukwu, MD   5 mg at 11/30/21 1950   pantoprazole (PROTONIX) EC tablet 40 mg  40 mg Oral BID Agbata, Tochukwu, MD   40 mg at 12/01/21 1106   polyethylene glycol (MIRALAX / GLYCOLAX) packet 17 g  17 g Oral Daily PRN Agbata, Tochukwu, MD       sacubitril-valsartan (ENTRESTO) 24-26 mg per tablet  1 tablet Oral BID Agbata, Tochukwu, MD   1 tablet at 12/01/21 1108   sertraline (ZOLOFT) tablet 100 mg  100 mg Oral Daily Agbata, Tochukwu, MD   100 mg at 12/01/21 1106   sevelamer carbonate (RENVELA) tablet 2,400 mg  2,400 mg Oral  TID with meals Agbata, Tochukwu, MD   2,400 mg at 12/01/21 1106   sodium chloride flush (NS) 0.9 % injection 3 mL  3 mL Intravenous Q12H Agbata, Tochukwu, MD   3  mL at 12/01/21 0000   sodium chloride flush (NS) 0.9 % injection 3 mL  3 mL Intravenous PRN Agbata, Tochukwu, MD       traZODone (DESYREL) tablet 50 mg  50 mg Oral QHS Agbata, Tochukwu, MD       zolpidem (AMBIEN) tablet 5 mg  5 mg Oral QHS Agbata, Tochukwu, MD   5 mg at 11/30/21 2338    REVIEW OF SYSTEMS:   '[X]'$  denotes positive finding, '[ ]'$  denotes negative finding Cardiac  Comments:  Chest pain or chest pressure:    Shortness of breath upon exertion:    Short of breath when lying flat:    Irregular heart rhythm:        Vascular    Pain in calf, thigh, or hip brought on by ambulation:    Pain in feet at night that wakes you up from your sleep:     Blood clot in your veins: x   Leg swelling:  x       Pulmonary    Oxygen at home:    Productive cough:     Wheezing:         Neurologic    Sudden weakness in arms or legs:     Sudden numbness in arms or legs:     Sudden onset of difficulty speaking or slurred speech:    Temporary loss of vision in one eye:     Problems with dizziness:         Gastrointestinal    Blood in stool:      Vomited blood:         Genitourinary    Burning when urinating:     Blood in urine:        Psychiatric    Major depression:         Hematologic    Bleeding problems:    Problems with blood clotting too easily:        Skin    Rashes or ulcers:        Constitutional    Fever or chills:     PHYSICAL EXAM:   Vitals:   11/30/21 2257 12/01/21 0154 12/01/21 0517 12/01/21 0746  BP: 115/75 111/65 136/89 130/83  Pulse: 81 80 83 75  Resp: '16 18 16   '$ Temp: 99.3 F (37.4 C)  98.9 F (37.2 C) 98.5 F (36.9 C)  TempSrc: Oral     SpO2: 98% 95% 99% 99%  Weight:      Height:        GENERAL: The patient is a well-nourished female, in no acute distress. The vital signs are documented  above. CARDIAC: There is a regular rate and rhythm.  VASCULAR: Both legs are soft to touch without significant pain PULMONARY: Nonlabored respirations ABDOMEN: Soft and non-tender  MUSCULOSKELETAL: There are no major deformities or cyanosis. NEUROLOGIC: No focal weakness or paresthesias are detected. SKIN: There are no ulcers or rashes noted. PSYCHIATRIC: The patient has a normal affect.  STUDIES:   I have reviewed her right leg venous ultrasound with the following findings: 1. Occlusive thrombus in the greater saphenous vein and peroneal vein. 2. Nonocclusive thrombus at the saphenofemoral junction.       ASSESSMENT and PLAN   Right leg DVT: Because of her recent retroperitoneal bleed, this poses a challenging situation, however with her family history and personal history of clotting disorder, in the setting of new DVT, I think the risks outweigh the benefits and she should be started  back on anticoagulation.  I would initially do this with heparin so that if she develops a problem it can be discontinued.  If she appears to tolerate heparin after 24-48 hours, she can be converted to oral medication.   Leia Alf, MD, FACS Vascular and Vein Specialists of [facility] [phone] Pager [phone]

## 2021-12-01 NOTE — Progress Notes (Signed)
PROGRESS NOTE    Teresa Cook  ZOX:[id number] DOB: 1955-03-07 DOA: 11/30/2021 PCP: [facility] [place]    Brief Narrative:  66 y.o. female with medical history significant for breast cancer status post bilateral mastectomy, seizure disorder, chronic diastolic dysfunction CHF, hypertension, anxiety with depression, end-stage renal disease on hemodialysis (M/W/F) who presented to the emergency room from dialysis center because she was unable to get her scheduled renal replacement therapy today. Patient states that she had gone for her scheduled appointment but they were unable to dialyze her because the machine kept clotting off.  The staff at the dialysis center asked her to go to the ER so she could be dialyzed. She complains of chest tightness and shortness of breath with exertion.  She has no lower extremity swelling. She also complains of pain in her right thigh and calf as well as a burning sensation. Chart review shows that she was recently discharged from the hospital after an admission for retroperitoneal bleed following renal artery hemorrhage in the setting of supratherapeutic INR.  She was seen by vascular surgery and had coil embolization of the right renal artery done.  Indications for anticoagulation in this patient include venous thromboembolic disease as well as paroxysmal A-fib.  She was started back on IV heparin during the hospitalization and then switched to Eliquis.  She was not discharged on Eliquis because cardiology and nephrology recommended to hold Eliquis in the setting of anemia/hematuria and they would decide when to resume as an outpatient. She continues to have mild hematuria as well as right flank pain but improved from admission. She denies having any chest pain, no nausea, no vomiting, no changes in her bowel habits, no headache, no fever, no chills, no cough, no blurred vision no focal deficit. Labs show hemoglobin of 10.7 compared to 7.9 on  admission. Lower extremity venous Doppler shows occlusive thrombus in the greater saphenous vein and peroneal vein. Nonocclusive thrombus at the saphenofemoral junction.  Assessment & Plan:   Principal Problem:   ESRD on hemodialysis ([place]) Active Problems:   Deep venous thrombosis (DVT) of right peroneal vein (HCC)   Hypercalcemia   Hypertension   Moderate recurrent major depression (HCC)   PAF (paroxysmal atrial fibrillation) (HCC)   Chronic diastolic CHF (congestive heart failure) ([place])  ESRD on hemodialysis ([place]) Dialysis days are M/W/F Patient was sent to the ER from the dialysis center because her access clotted off. Nephrology consulted for inpatient HD needs   Deep venous thrombosis (DVT) of right peroneal vein ([place]) Patient with a history of VTE who was on chronic anticoagulation with Coumadin and was recently hospitalized for right renal artery hemorrhage in the setting of a supratherapeutic INR. Patient's anticoagulation was placed on hold on discharge after risk/benefit discussion between IM, cardiology, nephrology Now with occlusive thrombus in the greater saphenous vein and peroneal vein. Nonocclusive thrombus at the saphenofemoral junction. Plan: Heparin gtt Vascular consult Monitor H/H (currently stable)  Hypercalcemia Probably iatrogenic Hold TUMS Monitor calcium levels   Chronic diastolic CHF (congestive heart failure) (HCC) Stable and not acutely exacerbated Continue carvedilol, Entresto and hydralazine Renal replacement therapy to manage volume status   PAF (paroxysmal atrial fibrillation) (HCC) Continue amiodarone and carvedilol for rate control IV heparin as above   Moderate recurrent major depression (HCC) Continue sertraline and trazodone   Hypertension Blood pressure is stable Continue carvedilol and hydralazine   DVT prophylaxis: hep gtt Code Status: FULL Family Communication:Daughter Jennefer Bravo [phone] on 11/25 Disposition  Plan: Status  is: Observation The patient will require care spanning > 2 midnights and should be moved to inpatient because: Complicated DVT on heparin GTT. Pending vascular recommendations, may require procedure.   Level of care: Telemetry Medical  Consultants:  Vascular Nephrology  Procedures:  None  Antimicrobials: None    Subjective: Seen and examined.  Stable, no distress  Objective: Vitals:   11/30/21 2257 12/01/21 0154 12/01/21 0517 12/01/21 0746  BP: 115/75 111/65 136/89 130/83  Pulse: 81 80 83 75  Resp: '16 18 16   '$ Temp: 99.3 F (37.4 C)  98.9 F (37.2 C) 98.5 F (36.9 C)  TempSrc: Oral     SpO2: 98% 95% 99% 99%  Weight:      Height:        Intake/Output Summary (Last 24 hours) at 12/01/2021 1120 Last data filed at 12/01/2021 0657 Gross per 24 hour  Intake 120.81 ml  Output 1800 ml  Net -1679.19 ml   Filed Weights   11/30/21 1437 11/30/21 1531  Weight: 65 kg 55 kg    Examination:  General exam: Appears calm and comfortable  Respiratory system: Clear to auscultation. Respiratory effort normal. Cardiovascular system: S1S2,RRR, no murmur Gastrointestinal system: Soft NTND, normal BS Central nervous system: Alert and oriented. No focal neurological deficits. Extremities: Symmetric 5 x 5 power. Skin: No rashes, lesions or ulcers Psychiatry: Judgement and insight appear normal. Mood & affect appropriate.     Data Reviewed: I have personally reviewed following labs and imaging studies  CBC: Recent Labs  Lab 11/30/21 0941 12/01/21 0814  WBC 10.8* 6.6  HGB 10.7* 8.9*  HCT 33.6* 27.4*  MCV 86.6 85.4  PLT 219 932   Basic Metabolic Panel: Recent Labs  Lab 11/30/21 0941 12/01/21 0814  NA 143 140  K 3.5 3.8  CL 106 102  CO2 23 25  GLUCOSE 100* 81  BUN 30* 20  CREATININE 8.35* 5.79*  CALCIUM 10.7* 10.1  MG 2.2  --    GFR: Estimated Creatinine Clearance: 8.2 mL/min (A) (by C-G formula based on SCr of 5.79 mg/dL (H)). Liver Function  Tests: No results for input(s): "AST", "ALT", "ALKPHOS", "BILITOT", "PROT", "ALBUMIN" in the last 168 hours. No results for input(s): "LIPASE", "AMYLASE" in the last 168 hours. No results for input(s): "AMMONIA" in the last 168 hours. Coagulation Profile: Recent Labs  Lab 11/30/21 0941  INR 1.3*   Cardiac Enzymes: No results for input(s): "CKTOTAL", "CKMB", "CKMBINDEX", "TROPONINI" in the last 168 hours. BNP (last 3 results) No results for input(s): "PROBNP" in the last 8760 hours. HbA1C: No results for input(s): "HGBA1C" in the last 72 hours. CBG: Recent Labs  Lab 11/30/21 2253  GLUCAP 80   Lipid Profile: No results for input(s): "CHOL", "HDL", "LDLCALC", "TRIG", "CHOLHDL", "LDLDIRECT" in the last 72 hours. Thyroid Function Tests: No results for input(s): "TSH", "T4TOTAL", "FREET4", "T3FREE", "THYROIDAB" in the last 72 hours. Anemia Panel: No results for input(s): "VITAMINB12", "FOLATE", "FERRITIN", "TIBC", "IRON", "RETICCTPCT" in the last 72 hours. Sepsis Labs: No results for input(s): "PROCALCITON", "LATICACIDVEN" in the last 168 hours.  No results found for this or any previous visit (from the past 240 hour(s)).       Radiology Studies: US Venous Img Lower Right (DVT Study)  Result Date: 11/30/2021 CLINICAL DATA:  Right lower extremity pain. EXAM: Right LOWER EXTREMITY VENOUS DOPPLER ULTRASOUND TECHNIQUE: Gray-scale sonography with graded compression, as well as color Doppler and duplex ultrasound were performed to evaluate the lower extremity deep venous systems from the level  of the common femoral vein and including the common femoral, femoral, profunda femoral, popliteal and calf veins including the posterior tibial, peroneal and gastrocnemius veins when visible. The superficial great saphenous vein was also interrogated. Spectral Doppler was utilized to evaluate flow at rest and with distal augmentation maneuvers in the common femoral, femoral and popliteal veins.  COMPARISON:  None Available. FINDINGS: Contralateral Common Femoral Vein: Respiratory phasicity is normal and symmetric with the symptomatic side. No evidence of thrombus. Normal compressibility. Common Femoral Vein: No evidence of thrombus. Normal compressibility, respiratory phasicity and response to augmentation. Saphenofemoral Junction: Nonocclusive thrombus is noted. Profunda Femoral Vein: No evidence of thrombus. Normal compressibility and flow on color Doppler imaging. Femoral Vein: No evidence of thrombus. Normal compressibility, respiratory phasicity and response to augmentation. Popliteal Vein: No evidence of thrombus. Normal compressibility, respiratory phasicity and response to augmentation. Calf Veins: Occlusive thrombus in the peroneal vein. Superficial Great Saphenous Vein: Occlusive thrombus. Venous Reflux:  None. Other Findings:  None. IMPRESSION: 1. Occlusive thrombus in the greater saphenous vein and peroneal vein. 2. Nonocclusive thrombus at the saphenofemoral junction. These results will be called to the ordering clinician or representative by the Radiologist Assistant, and communication documented in the PACS or [employer]. Electronically Signed   By: Marijo Sanes M.D.   On: 11/30/2021 13:09   DG Chest 2 View  Result Date: 11/30/2021 CLINICAL DATA:  Chest pain. EXAM: CHEST - 2 VIEW COMPARISON:  November 22, 2021. FINDINGS: The heart size and mediastinal contours are within normal limits. Left internal jugular dialysis catheter is unchanged in position. Both lungs are clear. The visualized skeletal structures are unremarkable. IMPRESSION: No active cardiopulmonary disease. Aortic Atherosclerosis (ICD10-I70.0). Electronically Signed   By: Marijo Conception M.D.   On: 11/30/2021 10:01        Scheduled Meds:  amiodarone  200 mg Oral BID   carvedilol  12.5 mg Oral BID   Chlorhexidine Gluconate Cloth  6 each Topical Q0600   hydrALAZINE  25 mg Oral TID   multivitamin  1 tablet  Oral QHS   pantoprazole  40 mg Oral BID   sacubitril-valsartan  1 tablet Oral BID   sertraline  100 mg Oral Daily   sevelamer carbonate  2,400 mg Oral TID with meals   sodium chloride flush  3 mL Intravenous Q12H   traZODone  50 mg Oral QHS   zolpidem  5 mg Oral QHS   Continuous Infusions:  sodium chloride     heparin 1,100 Units/hr (12/01/21 0657)     LOS: 0 days      Sidney Ace, MD [facility]   If 7PM-7AM, please contact night-coverage  12/01/2021, 11:20 AM

## 2021-12-01 NOTE — Progress Notes (Signed)
[facility]  ROUNDING NOTE   Subjective:   Teresa Cook is a 66 year old African-American female with past medical conditions including hypertension, gout, anxiety with depression, sickle cell trait, systolic heart failure with a EF 40 to 45%, breast cancer with bilateral mastectomy, seizures, and end-stage renal disease on hemodialysis.  Patient presents to the emergency department via EMS for multiple syncopal episodes.  Patient has been admitted for Acute deep vein thrombosis (DVT) of proximal vein of right lower extremity (HCC) [I82.4Y1] Deep venous thrombosis (DVT) of right peroneal vein ([place]) [I82.451]  Patient is known to our practice and receives outpatient dialysis treatments at [facility] on a MWF schedule, supervised by Dr. Candiss Norse.   Nephrology was consulted for comanagement of dialysis patient.  Patient resting comfortably in the bed.  Patient offers no new specific physical complaints     Objective:  Vital signs in last 24 hours:  Temp:  [98 F (36.7 C)-99.3 F (37.4 C)] 98.5 F (36.9 C) (11/25 0746) Pulse Rate:  [73-87] 75 (11/25 0746) Resp:  [16-26] 16 (11/25 0517) BP: (111-185)/(65-108) 130/83 (11/25 0746) SpO2:  [95 %-100 %] 99 % (11/25 0746)  Weight change:  Filed Weights   11/30/21 1437 11/30/21 1531  Weight: 65 kg 55 kg     Intake/Output: I/O last 3 completed shifts: In: 120.8 [I.V.:120.8] Out: 1800 [Other:1800]   Intake/Output this shift:  No intake/output data recorded.  Physical Exam: General: NAD  Head: Normocephalic, atraumatic. Moist oral mucosal membranes  Eyes: Anicteric  Lungs:  Clear to auscultation, normal effort, room air  Heart: Regular rate and rhythm  Abdomen:  Soft, tender, BS present  Extremities:  No peripheral edema.  Neurologic: Nonfocal, moving all four extremities  Skin: No lesions  Access: Lt Permcath    Basic Metabolic Panel: Recent Labs  Lab 11/30/21 0941 12/01/21 0814  NA 143 140  K  3.5 3.8  CL 106 102  CO2 23 25  GLUCOSE 100* 81  BUN 30* 20  CREATININE 8.35* 5.79*  CALCIUM 10.7* 10.1  MG 2.2  --     Liver Function Tests: No results for input(s): "AST", "ALT", "ALKPHOS", "BILITOT", "PROT", "ALBUMIN" in the last 168 hours. No results for input(s): "LIPASE", "AMYLASE" in the last 168 hours. No results for input(s): "AMMONIA" in the last 168 hours.  CBC: Recent Labs  Lab 11/30/21 0941 12/01/21 0814  WBC 10.8* 6.6  HGB 10.7* 8.9*  HCT 33.6* 27.4*  MCV 86.6 85.4  PLT 219 154    Cardiac Enzymes: No results for input(s): "CKTOTAL", "CKMB", "CKMBINDEX", "TROPONINI" in the last 168 hours.  BNP: Invalid input(s): "POCBNP"  CBG: Recent Labs  Lab 11/30/21 2253  GLUCAP 6     Microbiology: Results for orders placed or performed during the hospital encounter of 11/05/21  Blood Culture (routine x 2)     Status: None   Collection Time: 11/05/21  3:20 AM   Specimen: BLOOD  Result Value Ref Range Status   Specimen Description BLOOD RIGHT FA  Final   Special Requests   Final    BOTTLES DRAWN AEROBIC AND ANAEROBIC Blood Culture results may not be optimal due to an inadequate volume of blood received in culture bottles   Culture   Final    NO GROWTH 5 DAYS Performed at [facility], [address], [place], [place] [address]    Report Status 11/10/2021 FINAL  Final  SARS Coronavirus 2 by RT PCR (hospital order, performed in [facility] hospital lab) *  cepheid single result test* Anterior Nasal Swab     Status: None   Collection Time: 11/05/21  3:20 AM   Specimen: Anterior Nasal Swab  Result Value Ref Range Status   SARS Coronavirus 2 by RT PCR NEGATIVE NEGATIVE Final    Comment: (NOTE) SARS-CoV-2 target nucleic acids are NOT DETECTED.  The SARS-CoV-2 RNA is generally detectable in upper and lower respiratory specimens during the acute phase of infection. The lowest concentration of SARS-CoV-2 viral copies this assay can detect is 250 copies  / mL. A negative result does not preclude SARS-CoV-2 infection and should not be used as the sole basis for treatment or other patient management decisions.  A negative result may occur with improper specimen collection / handling, submission of specimen other than nasopharyngeal swab, presence of viral mutation(s) within the areas targeted by this assay, and inadequate number of viral copies (<250 copies / mL). A negative result must be combined with clinical observations, patient history, and epidemiological information.  Fact Sheet for Patients:   https://www.patel.info/  Fact Sheet for Healthcare Providers: https://hall.com/  This test is not yet approved or  cleared by the [place] FDA and has been authorized for detection and/or diagnosis of SARS-CoV-2 by FDA under an Emergency Use Authorization (EUA).  This EUA will remain in effect (meaning this test can be used) for the duration of the COVID-19 declaration under Section 564(b)(1) of the Act, 21 U.S.C. section 360bbb-3(b)(1), unless the authorization is terminated or revoked sooner.  Performed at [facility], [address] [place], [place] [address]   Blood Culture (routine x 2)     Status: None   Collection Time: 11/05/21  3:21 AM   Specimen: BLOOD  Result Value Ref Range Status   Specimen Description BLOOD RIGHT HAND  Final   Special Requests   Final    BOTTLES DRAWN AEROBIC AND ANAEROBIC Blood Culture results may not be optimal due to an inadequate volume of blood received in culture bottles   Culture   Final    NO GROWTH 5 DAYS Performed at [facility], [address] [place], [place] [address]    Report Status 11/10/2021 FINAL  Final  MRSA Next Gen by PCR, Nasal     Status: None   Collection Time: 11/05/21  6:57 AM   Specimen: Nasal Mucosa; Nasal Swab  Result Value Ref Range Status   MRSA by PCR Next Gen NOT DETECTED NOT DETECTED Final     Comment: (NOTE) The GeneXpert MRSA Assay (FDA approved for NASAL specimens only), is one component of a comprehensive MRSA colonization surveillance program. It is not intended to diagnose MRSA infection nor to guide or monitor treatment for MRSA infections. Test performance is not FDA approved in patients less than 31 years old. Performed at [facility], [address] [place], [place] [address]     Coagulation Studies: Recent Labs    11/30/21 0941  LABPROT 16.1*  INR 1.3*    Urinalysis: No results for input(s): "COLORURINE", "LABSPEC", "PHURINE", "GLUCOSEU", "HGBUR", "BILIRUBINUR", "KETONESUR", "PROTEINUR", "UROBILINOGEN", "NITRITE", "LEUKOCYTESUR" in the last 72 hours.  Invalid input(s): "APPERANCEUR"     Imaging: US Venous Img Lower Right (DVT Study)  Result Date: 11/30/2021 CLINICAL DATA:  Right lower extremity pain. EXAM: Right LOWER EXTREMITY VENOUS DOPPLER ULTRASOUND TECHNIQUE: Gray-scale sonography with graded compression, as well as color Doppler and duplex ultrasound were performed to evaluate the lower extremity deep venous systems from the level of the common femoral vein and  including the common femoral, femoral, profunda femoral, popliteal and calf veins including the posterior tibial, peroneal and gastrocnemius veins when visible. The superficial great saphenous vein was also interrogated. Spectral Doppler was utilized to evaluate flow at rest and with distal augmentation maneuvers in the common femoral, femoral and popliteal veins. COMPARISON:  None Available. FINDINGS: Contralateral Common Femoral Vein: Respiratory phasicity is normal and symmetric with the symptomatic side. No evidence of thrombus. Normal compressibility. Common Femoral Vein: No evidence of thrombus. Normal compressibility, respiratory phasicity and response to augmentation. Saphenofemoral Junction: Nonocclusive thrombus is noted. Profunda Femoral Vein: No evidence of thrombus. Normal  compressibility and flow on color Doppler imaging. Femoral Vein: No evidence of thrombus. Normal compressibility, respiratory phasicity and response to augmentation. Popliteal Vein: No evidence of thrombus. Normal compressibility, respiratory phasicity and response to augmentation. Calf Veins: Occlusive thrombus in the peroneal vein. Superficial Great Saphenous Vein: Occlusive thrombus. Venous Reflux:  None. Other Findings:  None. IMPRESSION: 1. Occlusive thrombus in the greater saphenous vein and peroneal vein. 2. Nonocclusive thrombus at the saphenofemoral junction. These results will be called to the ordering clinician or representative by the Radiologist Assistant, and communication documented in the PACS or [employer]. Electronically Signed   By: Marijo Sanes M.D.   On: 11/30/2021 13:09   DG Chest 2 View  Result Date: 11/30/2021 CLINICAL DATA:  Chest pain. EXAM: CHEST - 2 VIEW COMPARISON:  November 22, 2021. FINDINGS: The heart size and mediastinal contours are within normal limits. Left internal jugular dialysis catheter is unchanged in position. Both lungs are clear. The visualized skeletal structures are unremarkable. IMPRESSION: No active cardiopulmonary disease. Aortic Atherosclerosis (ICD10-I70.0). Electronically Signed   By: Marijo Conception M.D.   On: 11/30/2021 10:01     Medications:    sodium chloride     heparin 1,100 Units/hr (12/01/21 0657)    amiodarone  200 mg Oral BID   carvedilol  12.5 mg Oral BID   Chlorhexidine Gluconate Cloth  6 each Topical Q0600   hydrALAZINE  25 mg Oral TID   multivitamin  1 tablet Oral QHS   pantoprazole  40 mg Oral BID   sacubitril-valsartan  1 tablet Oral BID   sertraline  100 mg Oral Daily   sevelamer carbonate  2,400 mg Oral TID with meals   sodium chloride flush  3 mL Intravenous Q12H   traZODone  50 mg Oral QHS   zolpidem  5 mg Oral QHS   sodium chloride, acetaminophen **OR** acetaminophen, ALPRAZolam, ondansetron **OR** ondansetron  (ZOFRAN) IV, oxyCODONE, polyethylene glycol, sodium chloride flush  Assessment/ Plan:  Ms. Katrine Radich is a 66 y.o.  female is a 66 year old African-American female with past medical conditions including hypertension, gout, anxiety with depression, sickle cell trait, systolic heart failure with a EF 40 to 45%, breast cancer with bilateral mastectomy, seizures, and end-stage renal disease on hemodialysis.  Patient presents to the emergency department via EMS for multiple syncopal episodes.  Patient has been admitted for Acute deep vein thrombosis (DVT) of proximal vein of right lower extremity (HCC) [I82.4Y1] Deep venous thrombosis (DVT) of right peroneal vein (HCC) [I82.451]  [facility]/MWF/Lt Permcath  End-stage renal disease on hemodialysis.   Patient is on Monday Wednesday Friday schedule. Patient was last dialyzed yesterday No need for renal placement therapy today   2. Anemia of chronic kidney disease  Lab Results  Component Value Date   HGB 8.9 (L) 12/01/2021  During last admission patient had severe hemorrhage in the  right kidney requiring coil embolization.  Patient anticoagulants were thereafter stopped with the plan to have an outpatient follow-up with cardiology before her anticoagulants could be restarted   3. Secondary Hyperparathyroidism: with outpatient labs: PTH 379, phosphorus 5.7, calcium 9.4 on June 18, 2021.   Lab Results  Component Value Date   PTH 214 (H) 07/08/2021   CALCIUM 10.1 12/01/2021   PHOS 2.9 11/22/2021  Bone minerals remain acceptable.  Continue sevelamer with all meals.  4.  Hypertension  Blood pressure stable  5.  .Atrial fibrillation now admitted with DVT Patient has history of atrial fibrillation, and now is being admitted with DVT Patient does need anticoagulants, patient currently on heparin       LOS: 0 Kaedence Connelly s Miia Blanks 11/25/20234:00 PM

## 2021-12-01 NOTE — Consult Note (Signed)
ANTICOAGULATION CONSULT NOTE - Initial Consult  Pharmacy Consult for heparin infusion Indication: DVT  Allergies  Allergen Reactions   Gabapentin Other (See Comments)    Seizure   Adhesive [Tape] Itching    Silk tape is ok to use.    Patient Measurements: Height: '5\' 5"'$  (165.1 cm) Weight: 55 kg (121 lb 4.1 oz) IBW/kg (Calculated) : 57 Heparin Dosing Weight: 65 kg  Vital Signs: Temp: 98.7 F (37.1 C) (11/25 1611) BP: 132/76 (11/25 1611) Pulse Rate: 72 (11/25 1611)  Labs: Recent Labs    11/30/21 0941 11/30/21 2247 12/01/21 0814 12/01/21 1553  HGB 10.7*  --  8.9*  --   HCT 33.6*  --  27.4*  --   PLT 219  --  154  --   APTT  --  30 87*  --   LABPROT 16.1*  --   --   --   INR 1.3*  --   --   --   HEPARINUNFRC  --  <0.10* 0.51 0.51  CREATININE 8.35*  --  5.79*  --   TROPONINIHS 11  --   --   --      Estimated Creatinine Clearance: 8.2 mL/min (A) (by C-G formula based on SCr of 5.79 mg/dL (H)).   Medical History: Past Medical History:  Diagnosis Date   Anemia    Anxiety    Breast cancer ([place]) 01/2016   bilateral   CHF (congestive heart failure) (HCC)    Chronic kidney disease    Depression    Dialysis patient [facility])    DVT (deep venous thrombosis) (HCC)    left leg   DVT (deep venous thrombosis) ([place]) 1985   right thigh   Dysrhythmia    Gout    Headache    HTN (hypertension)    Hypertension    Parathyroid abnormality (HCC)    Parathyroid disease ([place])    Pneumonia 12/2015   Psoriasis    Renal insufficiency    Sickle cell trait (HCC)    traits    Medications:  PTA: Eliquis 2.'5mg'$  BID  Inpatient: Heparin infusion (11/24 > )  Allergies: No AC/APT related allergies  Assessment: 66 year old female with PMH hypertension, pAfib on Eliquis, CHF, and ESRD on HD MWF presents to ED with complaints of chest tightness and SOB. Patient was reportedely unable to get dialysis Wednesday due to blood clots in dialysis port. Also Eliquis was reportedly  discontinued on 10/30 but received Eliquis during recent hospital admission from 11/9 through 11/15. Will check confirmatory heparin level to check this. Pharmacy consulted for management of heparin infusion in the setting of DVT.  Date Time  HL Rate/Comment 11/24 2247 <0.1 Baseline with aPTT at 30s; correlation noted. 11/25 0814 0.51 Therapeutic x1; 1100 un/hr 11/25 1553 0.51 Therapeutic x 2 @ 1100 un/hr  Goal of Therapy:  Heparin level 0.3-0.7 units/ml aPTT 66-102 seconds Monitor platelets by anticoagulation protocol: Yes  Plan:  Heparin level therapeutic x 2 Continue heparin infusion at 1100 units/hr Check HL with AM labs  Lab correlation is noted, can now titrate by HL alone. Continue to monitor H&H and platelets daily while on heparin gtt.  Akshara Blumenthal Rodriguez-Guzman PharmD, BCPS 12/01/2021 4:52 PM

## 2021-12-01 NOTE — Consult Note (Signed)
ANTICOAGULATION CONSULT NOTE - Initial Consult  Pharmacy Consult for heparin infusion Indication: DVT  Allergies  Allergen Reactions   Gabapentin Other (See Comments)    Seizure   Adhesive [Tape] Itching    Silk tape is ok to use.    Patient Measurements: Height: '5\' 5"'$  (165.1 cm) Weight: 55 kg (121 lb 4.1 oz) IBW/kg (Calculated) : 57 Heparin Dosing Weight: 65 kg  Vital Signs: Temp: 98.9 F (37.2 C) (11/25 0517) Temp Source: Oral (11/24 2257) BP: 136/89 (11/25 0517) Pulse Rate: 83 (11/25 0517)  Labs: Recent Labs    11/30/21 0941 11/30/21 2247  HGB 10.7*  --   HCT 33.6*  --   PLT 219  --   APTT  --  30  LABPROT 16.1*  --   INR 1.3*  --   HEPARINUNFRC  --  <0.10*  CREATININE 8.35*  --   TROPONINIHS 11  --      Estimated Creatinine Clearance: 5.7 mL/min (A) (by C-G formula based on SCr of 8.35 mg/dL (H)).   Medical History: Past Medical History:  Diagnosis Date   Anemia    Anxiety    Breast cancer ([place]) 01/2016   bilateral   CHF (congestive heart failure) (HCC)    Chronic kidney disease    Depression    Dialysis patient [facility])    DVT (deep venous thrombosis) (HCC)    left leg   DVT (deep venous thrombosis) ([place]) 1985   right thigh   Dysrhythmia    Gout    Headache    HTN (hypertension)    Hypertension    Parathyroid abnormality (HCC)    Parathyroid disease ([place])    Pneumonia 12/2015   Psoriasis    Renal insufficiency    Sickle cell trait (HCC)    traits    Medications:  PTA: Eliquis 2.'5mg'$  BID  Inpatient: Heparin infusion (11/24 > )  Allergies: No AC/APT related allergies  Assessment: 66 year old female with PMH hypertension, pAfib on Eliquis, CHF, and ESRD on HD MWF presents to ED with complaints of chest tightness and SOB. Patient was reportedely unable to get dialysis Wednesday due to blood clots in dialysis port. Also Eliquis was reportedly discontinued on 10/30 but received Eliquis during recent hospital admission from 11/9 through  11/15. Will check confirmatory heparin level to check this. Pharmacy consulted for management of heparin infusion in the setting of DVT.  Date Time  HL Rate/Comment 11/24 2247 <0.1 Baseline with aPTT at 30s; correlation noted. 11/25 0814 0.51 Therapeutic x1; 1100 un/hr  Goal of Therapy:  Heparin level 0.3-0.7 units/ml aPTT 66-102 seconds Monitor platelets by anticoagulation protocol: Yes  Plan:  Heparin level therapeutic x1 11/25 0814.  Continue heparin infusion at 1100 units/hr Check Anti-Xa level in 8hrs to confirm rate and then daily once consecutively therapeutic.  Lab correlation is noted, can now titrate by anti-xa alone. Continue to monitor H&H and platelets daily while on heparin gtt.  Lorna Dibble Clinical Pharmacist 12/01/2021 7:29 AM

## 2021-12-02 ENCOUNTER — Encounter: Payer: Self-pay | Admitting: Internal Medicine

## 2021-12-02 DIAGNOSIS — I5032 Chronic diastolic (congestive) heart failure: Secondary | ICD-10-CM

## 2021-12-02 DIAGNOSIS — N186 End stage renal disease: Secondary | ICD-10-CM | POA: Diagnosis not present

## 2021-12-02 DIAGNOSIS — I82451 Acute embolism and thrombosis of right peroneal vein: Secondary | ICD-10-CM | POA: Diagnosis not present

## 2021-12-02 DIAGNOSIS — Z992 Dependence on renal dialysis: Secondary | ICD-10-CM | POA: Diagnosis not present

## 2021-12-02 LAB — CBC
HCT: 24.7 % — ABNORMAL LOW (ref 36.0–46.0)
Hemoglobin: 8 g/dL — ABNORMAL LOW (ref 12.0–15.0)
MCH: 27.6 pg (ref 26.0–34.0)
MCHC: 32.4 g/dL (ref 30.0–36.0)
MCV: 85.2 fL (ref 80.0–100.0)
Platelets: 150 10*3/uL (ref 150–400)
RBC: 2.9 MIL/uL — ABNORMAL LOW (ref 3.87–5.11)
RDW: 19.1 % — ABNORMAL HIGH (ref 11.5–15.5)
WBC: 6.1 10*3/uL (ref 4.0–10.5)
nRBC: 0 % (ref 0.0–0.2)

## 2021-12-02 LAB — HEPARIN LEVEL (UNFRACTIONATED)
Heparin Unfractionated: 0.96 IU/mL — ABNORMAL HIGH (ref 0.30–0.70)
Heparin Unfractionated: 0.42 IU/mL (ref 0.30–0.70)
Heparin Unfractionated: 0.35 IU/mL (ref 0.30–0.70)

## 2021-12-02 NOTE — Progress Notes (Signed)
[facility]  ROUNDING NOTE   Subjective:   Teresa Cook is a 66 year old African-American female with past medical conditions including hypertension, gout, anxiety with depression, sickle cell trait, systolic heart failure with a EF 40 to 45%, breast cancer with bilateral mastectomy, seizures, and end-stage renal disease on hemodialysis.  Patient presents to the emergency department via EMS for multiple syncopal episodes.  Patient has been admitted for Acute deep vein thrombosis (DVT) of proximal vein of right lower extremity (HCC) [I82.4Y1] Deep venous thrombosis (DVT) of right peroneal vein ([place]) [I82.451]  Patient is known to our practice and receives outpatient dialysis treatments at [facility] on a MWF schedule, supervised by Dr. Candiss Norse.   Nephrology was consulted for comanagement of dialysis patient.  Patient resting comfortably in the bed.  Patient offers no new specific physical complaints     Objective:  Vital signs in last 24 hours:  Temp:  [98.4 F (36.9 C)-98.7 F (37.1 C)] 98.4 F (36.9 C) (11/26 0744) Pulse Rate:  [66-75] 66 (11/26 0744) Resp:  [16-17] 16 (11/26 0744) BP: (109-151)/(66-76) 117/66 (11/26 0744) SpO2:  [97 %-99 %] 97 % (11/26 0744)  Weight change:  Filed Weights   11/30/21 1437 11/30/21 1531  Weight: 65 kg 55 kg     Intake/Output: I/O last 3 completed shifts: In: 240.8 [I.V.:240.8] Out: 1 [Urine:1]   Intake/Output this shift:  No intake/output data recorded.  Physical Exam: General: NAD  Head: Normocephalic, atraumatic. Moist oral mucosal membranes  Eyes: Anicteric  Lungs:  Clear to auscultation, normal effort, room air  Heart: Regular rate and rhythm  Abdomen:  Soft, tender, BS present  Extremities:  No peripheral edema.  Neurologic: Nonfocal, moving all four extremities  Skin: No lesions  Access: Lt Permcath    Basic Metabolic Panel: Recent Labs  Lab 11/30/21 0941 12/01/21 0814  NA 143 140  K 3.5  3.8  CL 106 102  CO2 23 25  GLUCOSE 100* 81  BUN 30* 20  CREATININE 8.35* 5.79*  CALCIUM 10.7* 10.1  MG 2.2  --     Liver Function Tests: No results for input(s): "AST", "ALT", "ALKPHOS", "BILITOT", "PROT", "ALBUMIN" in the last 168 hours. No results for input(s): "LIPASE", "AMYLASE" in the last 168 hours. No results for input(s): "AMMONIA" in the last 168 hours.  CBC: Recent Labs  Lab 11/30/21 0941 12/01/21 0814 12/02/21 0420  WBC 10.8* 6.6 6.1  HGB 10.7* 8.9* 8.0*  HCT 33.6* 27.4* 24.7*  MCV 86.6 85.4 85.2  PLT 219 154 150    Cardiac Enzymes: No results for input(s): "CKTOTAL", "CKMB", "CKMBINDEX", "TROPONINI" in the last 168 hours.  BNP: Invalid input(s): "POCBNP"  CBG: Recent Labs  Lab 11/30/21 2253  GLUCAP 39     Microbiology: Results for orders placed or performed during the hospital encounter of 11/05/21  Blood Culture (routine x 2)     Status: None   Collection Time: 11/05/21  3:20 AM   Specimen: BLOOD  Result Value Ref Range Status   Specimen Description BLOOD RIGHT FA  Final   Special Requests   Final    BOTTLES DRAWN AEROBIC AND ANAEROBIC Blood Culture results may not be optimal due to an inadequate volume of blood received in culture bottles   Culture   Final    NO GROWTH 5 DAYS Performed at [facility], [address], [place], [place] [address]    Report Status 11/10/2021 FINAL  Final  SARS Coronavirus 2 by RT PCR (hospital  order, performed in [facility] hospital lab) *cepheid single result test* Anterior Nasal Swab     Status: None   Collection Time: 11/05/21  3:20 AM   Specimen: Anterior Nasal Swab  Result Value Ref Range Status   SARS Coronavirus 2 by RT PCR NEGATIVE NEGATIVE Final    Comment: (NOTE) SARS-CoV-2 target nucleic acids are NOT DETECTED.  The SARS-CoV-2 RNA is generally detectable in upper and lower respiratory specimens during the acute phase of infection. The lowest concentration of SARS-CoV-2 viral copies  this assay can detect is 250 copies / mL. A negative result does not preclude SARS-CoV-2 infection and should not be used as the sole basis for treatment or other patient management decisions.  A negative result may occur with improper specimen collection / handling, submission of specimen other than nasopharyngeal swab, presence of viral mutation(s) within the areas targeted by this assay, and inadequate number of viral copies (<250 copies / mL). A negative result must be combined with clinical observations, patient history, and epidemiological information.  Fact Sheet for Patients:   https://www.patel.info/  Fact Sheet for Healthcare Providers: https://hall.com/  This test is not yet approved or  cleared by the [place] FDA and has been authorized for detection and/or diagnosis of SARS-CoV-2 by FDA under an Emergency Use Authorization (EUA).  This EUA will remain in effect (meaning this test can be used) for the duration of the COVID-19 declaration under Section 564(b)(1) of the Act, 21 U.S.C. section 360bbb-3(b)(1), unless the authorization is terminated or revoked sooner.  Performed at [facility], [address] [place], [place] [address]   Blood Culture (routine x 2)     Status: None   Collection Time: 11/05/21  3:21 AM   Specimen: BLOOD  Result Value Ref Range Status   Specimen Description BLOOD RIGHT HAND  Final   Special Requests   Final    BOTTLES DRAWN AEROBIC AND ANAEROBIC Blood Culture results may not be optimal due to an inadequate volume of blood received in culture bottles   Culture   Final    NO GROWTH 5 DAYS Performed at [facility], [address], [place], [place] [address]    Report Status 11/10/2021 FINAL  Final  MRSA Next Gen by PCR, Nasal     Status: None   Collection Time: 11/05/21  6:57 AM   Specimen: Nasal Mucosa; Nasal Swab  Result Value Ref Range Status   MRSA by PCR Next Gen  NOT DETECTED NOT DETECTED Final    Comment: (NOTE) The GeneXpert MRSA Assay (FDA approved for NASAL specimens only), is one component of a comprehensive MRSA colonization surveillance program. It is not intended to diagnose MRSA infection nor to guide or monitor treatment for MRSA infections. Test performance is not FDA approved in patients less than 41 years old. Performed at [facility], [address] [place], [place] [id number]     Coagulation Studies: Recent Labs    11/30/21 0941  LABPROT 16.1*  INR 1.3*    Urinalysis: No results for input(s): "COLORURINE", "LABSPEC", "PHURINE", "GLUCOSEU", "HGBUR", "BILIRUBINUR", "KETONESUR", "PROTEINUR", "UROBILINOGEN", "NITRITE", "LEUKOCYTESUR" in the last 72 hours.  Invalid input(s): "APPERANCEUR"     Imaging: US Venous Img Lower Right (DVT Study)  Result Date: 11/30/2021 CLINICAL DATA:  Right lower extremity pain. EXAM: Right LOWER EXTREMITY VENOUS DOPPLER ULTRASOUND TECHNIQUE: Gray-scale sonography with graded compression, as well as color Doppler and duplex ultrasound were performed to evaluate the lower extremity deep venous systems from the  level of the common femoral vein and including the common femoral, femoral, profunda femoral, popliteal and calf veins including the posterior tibial, peroneal and gastrocnemius veins when visible. The superficial great saphenous vein was also interrogated. Spectral Doppler was utilized to evaluate flow at rest and with distal augmentation maneuvers in the common femoral, femoral and popliteal veins. COMPARISON:  None Available. FINDINGS: Contralateral Common Femoral Vein: Respiratory phasicity is normal and symmetric with the symptomatic side. No evidence of thrombus. Normal compressibility. Common Femoral Vein: No evidence of thrombus. Normal compressibility, respiratory phasicity and response to augmentation. Saphenofemoral Junction: Nonocclusive thrombus is noted. Profunda Femoral Vein:  No evidence of thrombus. Normal compressibility and flow on color Doppler imaging. Femoral Vein: No evidence of thrombus. Normal compressibility, respiratory phasicity and response to augmentation. Popliteal Vein: No evidence of thrombus. Normal compressibility, respiratory phasicity and response to augmentation. Calf Veins: Occlusive thrombus in the peroneal vein. Superficial Great Saphenous Vein: Occlusive thrombus. Venous Reflux:  None. Other Findings:  None. IMPRESSION: 1. Occlusive thrombus in the greater saphenous vein and peroneal vein. 2. Nonocclusive thrombus at the saphenofemoral junction. These results will be called to the ordering clinician or representative by the Radiologist Assistant, and communication documented in the PACS or [employer]. Electronically Signed   By: Marijo Sanes M.D.   On: 11/30/2021 13:09   DG Chest 2 View  Result Date: 11/30/2021 CLINICAL DATA:  Chest pain. EXAM: CHEST - 2 VIEW COMPARISON:  November 22, 2021. FINDINGS: The heart size and mediastinal contours are within normal limits. Left internal jugular dialysis catheter is unchanged in position. Both lungs are clear. The visualized skeletal structures are unremarkable. IMPRESSION: No active cardiopulmonary disease. Aortic Atherosclerosis (ICD10-I70.0). Electronically Signed   By: Marijo Conception M.D.   On: 11/30/2021 10:01     Medications:    sodium chloride 250 mL (12/02/21 0012)   heparin 1,000 Units/hr (12/02/21 0604)    amiodarone  200 mg Oral BID   carvedilol  12.5 mg Oral BID   Chlorhexidine Gluconate Cloth  6 each Topical Q0600   hydrALAZINE  25 mg Oral TID   multivitamin  1 tablet Oral QHS   pantoprazole  40 mg Oral BID   sacubitril-valsartan  1 tablet Oral BID   sertraline  100 mg Oral Daily   sevelamer carbonate  2,400 mg Oral TID with meals   sodium chloride flush  3 mL Intravenous Q12H   traZODone  50 mg Oral QHS   zolpidem  5 mg Oral QHS   sodium chloride, acetaminophen **OR**  acetaminophen, ALPRAZolam, ondansetron **OR** ondansetron (ZOFRAN) IV, oxyCODONE, polyethylene glycol, sodium chloride flush  Assessment/ Plan:  Teresa Cook is a 66 y.o.  female is a 66 year old African-American female with past medical conditions including hypertension, gout, anxiety with depression, sickle cell trait, systolic heart failure with a EF 40 to 45%, breast cancer with bilateral mastectomy, seizures, and end-stage renal disease on hemodialysis.  Patient presents to the emergency department via EMS for multiple syncopal episodes.  Patient has been admitted for Acute deep vein thrombosis (DVT) of proximal vein of right lower extremity (HCC) [I82.4Y1] Deep venous thrombosis (DVT) of right peroneal vein (HCC) [I82.451]  [facility] /MWF/Lt Permcath  End-stage renal disease on hemodialysis.   Patient is on Monday Wednesday Friday schedule. No need for renal placement therapy today We will dialyze patient tomorrow/Monday   2. Anemia of chronic kidney disease  Lab Results  Component Value Date   HGB 8.0 (L) 12/02/2021  During last admission patient had severe hemorrhage in the right kidney requiring coil embolization.  Patient anticoagulants were thereafter stopped with the plan to have an outpatient follow-up with cardiology before her anticoagulants could be restarted   3. Secondary Hyperparathyroidism: with outpatient labs: PTH 379, phosphorus 5.7, calcium 9.4 on June 18, 2021.   Lab Results  Component Value Date   PTH 214 (H) 07/08/2021   CALCIUM 10.1 12/01/2021   PHOS 2.9 11/22/2021  Bone minerals remain acceptable.  Continue sevelamer with all meals.  4.  Hypertension  Blood pressure stable  5.  .Atrial fibrillation now admitted with DVT Patient has history of atrial fibrillation, and now is being admitted with DVT Patient does need anticoagulants, patient currently on heparin       LOS: 1 Dellis Voght s [facility] 11/26/20239:30 AM

## 2021-12-02 NOTE — Consult Note (Signed)
ANTICOAGULATION CONSULT NOTE - Initial Consult  Pharmacy Consult for heparin infusion Indication: DVT  Allergies  Allergen Reactions   Gabapentin Other (See Comments)    Seizure   Adhesive [Tape] Itching    Silk tape is ok to use.    Patient Measurements: Height: '5\' 5"'$  (165.1 cm) Weight: 55 kg (121 lb 4.1 oz) IBW/kg (Calculated) : 57 Heparin Dosing Weight: 65 kg  Vital Signs: Temp: 98.4 F (36.9 C) (11/26 0744) BP: 117/66 (11/26 0744) Pulse Rate: 66 (11/26 0744)  Labs: Recent Labs    11/30/21 0941 11/30/21 2247 11/30/21 2247 12/01/21 0814 12/01/21 1553 12/02/21 0420 12/02/21 1455  HGB 10.7*  --   --  8.9*  --  8.0*  --   HCT 33.6*  --   --  27.4*  --  24.7*  --   PLT 219  --   --  154  --  150  --   APTT  --  30  --  87*  --   --   --   LABPROT 16.1*  --   --   --   --   --   --   INR 1.3*  --   --   --   --   --   --   HEPARINUNFRC  --  <0.10*   < > 0.51 0.51 0.96* 0.42  CREATININE 8.35*  --   --  5.79*  --   --   --   TROPONINIHS 11  --   --   --   --   --   --    < > = values in this interval not displayed.     Estimated Creatinine Clearance: 8.2 mL/min (A) (by C-G formula based on SCr of 5.79 mg/dL (H)).   Medical History: Past Medical History:  Diagnosis Date   Anemia    Anxiety    Breast cancer ([place]) 01/2016   bilateral   CHF (congestive heart failure) (HCC)    Chronic kidney disease    Depression    Dialysis patient [facility])    DVT (deep venous thrombosis) (HCC)    left leg   DVT (deep venous thrombosis) ([place]) 1985   right thigh   Dysrhythmia    Gout    Headache    HTN (hypertension)    Hypertension    Parathyroid abnormality (HCC)    Parathyroid disease ([place])    Pneumonia 12/2015   Psoriasis    Renal insufficiency    Sickle cell trait (HCC)    traits    Medications:  PTA: Eliquis 2.'5mg'$  BID  Inpatient: Heparin infusion (11/24 > )  Allergies: No AC/APT related allergies  Assessment: 66 year old female with PMH hypertension,  pAfib on Eliquis, CHF, and ESRD on HD MWF presents to ED with complaints of chest tightness and SOB. Patient was reportedely unable to get dialysis Wednesday due to blood clots in dialysis port. Also Eliquis was reportedly discontinued on 10/30 but received Eliquis during recent hospital admission from 11/9 through 11/15. Will check confirmatory heparin level to check this. Pharmacy consulted for management of heparin infusion in the setting of DVT.  Date Time  HL Rate/Comment 11/24 2247 <0.1 Baseline with aPTT at 30s; correlation noted. 11/25 0814 0.51 Therapeutic x1; 1100 un/hr 11/25 1553 0.51 Therapeutic x 2 @ 1100 un/hr 11/26   0420    0.96     SUPRAtherapeutic; 1100>1000 un/hr 11/26 1455 0.42 Therapeutic x1; 1000 un/hr  Goal of Therapy:  Heparin level 0.3-0.7 units/ml aPTT 66-102 seconds Monitor platelets by anticoagulation protocol: Yes  Plan:  11/26:  HL @ 1455 = 0.42, Therapeutic x1 Will continue heparin drip at 1000 units/hr and recheck HL 8 hrs to confirm rate change. Transition to daily monitoring once consecutively therapeutic. Continue to monitor H&H and platelets daily while on heparin gtt.  Lorna Dibble 12/02/2021 3:29 PM

## 2021-12-02 NOTE — Progress Notes (Signed)
Progress Note    Teresa Cook  TKZ:[id number] DOB: 1956/01/07  DOA: 11/30/2021 [facility]CP: [facility] [place]      Brief Narrative:    Medical records reviewed and are as summarized below:  Mayerli Kirst is a 66 y.o. female with medical history significant for breast cancer status post bilateral mastectomy, seizure disorder, chronic diastolic dysfunction CHF, hypertension, anxiety with depression, end-stage renal disease on hemodialysis (M/W/F), recent discharge from the hospital (11/22/2021) after hospitalization for retroperitoneal bleed following a retinal artery hemorrhage in the setting of supratherapeutic INR s/p coil embolization of right renal artery.  His Eliquis had been held at discharge because of anemia and hematuria.  She presented to the emergency room from dialysis center because she was unable to get her scheduled renal replacement therapy.  She said the machine kept clotting off so she was referred to the emergency room for dialysis.  She complained of chest tightness, shortness of breath, pain in her right thigh and right calf.  She was found to have right lower extremity DVT (occlusive thrombus in the greater saphenous vein and peroneal vein and nonocclusive thrombus at the saphenofemoral junction).  She was treated with IV heparin drip.   Assessment/Plan:   Principal Problem:   ESRD on hemodialysis ([place]) Active Problems:   Deep venous thrombosis (DVT) of right peroneal vein (HCC)   Hypercalcemia   Hypertension   Moderate recurrent major depression (HCC)   PAF (paroxysmal atrial fibrillation) (HCC)   Chronic diastolic CHF (congestive heart failure) (HCC)    Body mass index is 20.18 kg/m.    Right lower extremity DVT Patient with a history of VTE who was on chronic anticoagulation with Coumadin and was recently hospitalized for right renal artery hemorrhage in the setting of a supratherapeutic INR. Patient's anticoagulation was placed  on hold on discharge after risk/benefit discussion between IM, cardiology, nephrology Now with occlusive thrombus in the greater saphenous vein and peroneal vein. Nonocclusive thrombus at the saphenofemoral junction. Continue IV heparin drip and monitor heparin level per protocol.  Monitor H&H.  Plan to switch to Eliquis if she remains stable on IV heparin.  ESRD on MWF schedule Plan for hemodialysis tomorrow   Hypercalcemia Improved  Chronic diastolic CHF (congestive heart failure, hypertension Compensated.  Continue antihypertensives   PAF (paroxysmal atrial fibrillation) (HCC) Continue amiodarone and carvedilol   Moderate recurrent major depression (HCC) Continue sertraline and trazodone      Diet Order             Diet Heart Room service appropriate? Yes; Fluid consistency: Thin; Fluid restriction: 1500 mL Fluid  Diet effective now                            Consultants: Vascular surgeon Nephrologist  Procedures: None    Medications:    amiodarone  200 mg Oral BID   carvedilol  12.5 mg Oral BID   Chlorhexidine Gluconate Cloth  6 each Topical Q0600   hydrALAZINE  25 mg Oral TID   multivitamin  1 tablet Oral QHS   pantoprazole  40 mg Oral BID   sacubitril-valsartan  1 tablet Oral BID   sertraline  100 mg Oral Daily   sevelamer carbonate  2,400 mg Oral TID with meals   sodium chloride flush  3 mL Intravenous Q12H   traZODone  50 mg Oral QHS   zolpidem  5 mg Oral QHS   Continuous Infusions:  sodium chloride 250 mL (12/02/21 0012)   heparin 1,000 Units/hr (12/02/21 0604)     Anti-infectives (From admission, onward)    None              Family Communication/Anticipated D/C date and plan/Code Status   DVT prophylaxis:      Code Status: Full Code  Family Communication: None Disposition Plan: Plan to discharge home tomorrow   Status is: Inpatient Remains inpatient appropriate because: On IV heparin       Subjective:    Interval events noted.  She complains of pain in the right thigh.  No shortness of breath or chest pain.  Objective:    Vitals:   12/01/21 1611 12/01/21 2137 12/01/21 2314 12/02/21 0744  BP: 132/76 (!) 151/76 109/68 117/66  Pulse: 72 75 75 66  Resp: '17  16 16  '$ Temp: 98.7 F (37.1 C)  98.4 F (36.9 C) 98.4 F (36.9 C)  TempSrc:      SpO2: 97%  99% 97%  Weight:      Height:       No data found.   Intake/Output Summary (Last 24 hours) at 12/02/2021 1221 Last data filed at 12/02/2021 0654 Gross per 24 hour  Intake 119.97 ml  Output 1 ml  Net 118.97 ml   Filed Weights   11/30/21 1437 11/30/21 1531  Weight: 65 kg 55 kg    Exam:  GEN: NAD SKIN: Warm and dry EYES: EOMI ENT: MMM CV: RRR PULM: CTA B ABD: soft, ND, NT, +BS CNS: AAO x 3, non focal EXT: Right thigh tenderness. No edema.      Data Reviewed:   I have personally reviewed following labs and imaging studies:  Labs: Labs show the following:   Basic Metabolic Panel: Recent Labs  Lab 11/30/21 0941 12/01/21 0814  NA 143 140  K 3.5 3.8  CL 106 102  CO2 23 25  GLUCOSE 100* 81  BUN 30* 20  CREATININE 8.35* 5.79*  CALCIUM 10.7* 10.1  MG 2.2  --    GFR Estimated Creatinine Clearance: 8.2 mL/min (A) (by C-G formula based on SCr of 5.79 mg/dL (H)). Liver Function Tests: No results for input(s): "AST", "ALT", "ALKPHOS", "BILITOT", "PROT", "ALBUMIN" in the last 168 hours. No results for input(s): "LIPASE", "AMYLASE" in the last 168 hours. No results for input(s): "AMMONIA" in the last 168 hours. Coagulation profile Recent Labs  Lab 11/30/21 0941  INR 1.3*    CBC: Recent Labs  Lab 11/30/21 0941 12/01/21 0814 12/02/21 0420  WBC 10.8* 6.6 6.1  HGB 10.7* 8.9* 8.0*  HCT 33.6* 27.4* 24.7*  MCV 86.6 85.4 85.2  PLT 219 154 150   Cardiac Enzymes: No results for input(s): "CKTOTAL", "CKMB", "CKMBINDEX", "TROPONINI" in the last 168 hours. BNP (last 3 results) No results for input(s):  "PROBNP" in the last 8760 hours. CBG: Recent Labs  Lab 11/30/21 2253  GLUCAP 80   D-Dimer: No results for input(s): "DDIMER" in the last 72 hours. Hgb A1c: No results for input(s): "HGBA1C" in the last 72 hours. Lipid Profile: No results for input(s): "CHOL", "HDL", "LDLCALC", "TRIG", "CHOLHDL", "LDLDIRECT" in the last 72 hours. Thyroid function studies: No results for input(s): "TSH", "T4TOTAL", "T3FREE", "THYROIDAB" in the last 72 hours.  Invalid input(s): "FREET3" Anemia work up: No results for input(s): "VITAMINB12", "FOLATE", "FERRITIN", "TIBC", "IRON", "RETICCTPCT" in the last 72 hours. Sepsis Labs: Recent Labs  Lab 11/30/21 0941 12/01/21 0814 12/02/21 0420  WBC 10.8* 6.6 6.1  Microbiology No results found for this or any previous visit (from the past 240 hour(s)).  Procedures and diagnostic studies:  US Venous Img Lower Right (DVT Study)  Result Date: 11/30/2021 CLINICAL DATA:  Right lower extremity pain. EXAM: Right LOWER EXTREMITY VENOUS DOPPLER ULTRASOUND TECHNIQUE: Gray-scale sonography with graded compression, as well as color Doppler and duplex ultrasound were performed to evaluate the lower extremity deep venous systems from the level of the common femoral vein and including the common femoral, femoral, profunda femoral, popliteal and calf veins including the posterior tibial, peroneal and gastrocnemius veins when visible. The superficial great saphenous vein was also interrogated. Spectral Doppler was utilized to evaluate flow at rest and with distal augmentation maneuvers in the common femoral, femoral and popliteal veins. COMPARISON:  None Available. FINDINGS: Contralateral Common Femoral Vein: Respiratory phasicity is normal and symmetric with the symptomatic side. No evidence of thrombus. Normal compressibility. Common Femoral Vein: No evidence of thrombus. Normal compressibility, respiratory phasicity and response to augmentation. Saphenofemoral Junction:  Nonocclusive thrombus is noted. Profunda Femoral Vein: No evidence of thrombus. Normal compressibility and flow on color Doppler imaging. Femoral Vein: No evidence of thrombus. Normal compressibility, respiratory phasicity and response to augmentation. Popliteal Vein: No evidence of thrombus. Normal compressibility, respiratory phasicity and response to augmentation. Calf Veins: Occlusive thrombus in the peroneal vein. Superficial Great Saphenous Vein: Occlusive thrombus. Venous Reflux:  None. Other Findings:  None. IMPRESSION: 1. Occlusive thrombus in the greater saphenous vein and peroneal vein. 2. Nonocclusive thrombus at the saphenofemoral junction. These results will be called to the ordering clinician or representative by the Radiologist Assistant, and communication documented in the PACS or [employer]. Electronically Signed   By: Marijo Sanes M.D.   On: 11/30/2021 13:09               LOS: 1 day   Lallie Strahm  [facility]   Pager on [email or web address]. If 7PM-7AM, please contact night-coverage at [email or web address]     12/02/2021, 12:21 PM

## 2021-12-02 NOTE — Plan of Care (Signed)

## 2021-12-02 NOTE — Consult Note (Signed)
ANTICOAGULATION CONSULT NOTE - Initial Consult  Pharmacy Consult for heparin infusion Indication: DVT  Allergies  Allergen Reactions   Gabapentin Other (See Comments)    Seizure   Adhesive [Tape] Itching    Silk tape is ok to use.    Patient Measurements: Height: '5\' 5"'$  (165.1 cm) Weight: 55 kg (121 lb 4.1 oz) IBW/kg (Calculated) : 57 Heparin Dosing Weight: 65 kg  Vital Signs: Temp: 98.4 F (36.9 C) (11/25 2314) BP: 109/68 (11/25 2314) Pulse Rate: 75 (11/25 2314)  Labs: Recent Labs    11/30/21 0941 11/30/21 2247 11/30/21 2247 12/01/21 0814 12/01/21 1553 12/02/21 0420  HGB 10.7*  --   --  8.9*  --  8.0*  HCT 33.6*  --   --  27.4*  --  24.7*  PLT 219  --   --  154  --  150  APTT  --  30  --  87*  --   --   LABPROT 16.1*  --   --   --   --   --   INR 1.3*  --   --   --   --   --   HEPARINUNFRC  --  <0.10*   < > 0.51 0.51 0.96*  CREATININE 8.35*  --   --  5.79*  --   --   TROPONINIHS 11  --   --   --   --   --    < > = values in this interval not displayed.     Estimated Creatinine Clearance: 8.2 mL/min (A) (by C-G formula based on SCr of 5.79 mg/dL (H)).   Medical History: Past Medical History:  Diagnosis Date   Anemia    Anxiety    Breast cancer ([place]) 01/2016   bilateral   CHF (congestive heart failure) (HCC)    Chronic kidney disease    Depression    Dialysis patient [facility])    DVT (deep venous thrombosis) (HCC)    left leg   DVT (deep venous thrombosis) ([place]) 1985   right thigh   Dysrhythmia    Gout    Headache    HTN (hypertension)    Hypertension    Parathyroid abnormality (HCC)    Parathyroid disease ([place])    Pneumonia 12/2015   Psoriasis    Renal insufficiency    Sickle cell trait (HCC)    traits    Medications:  PTA: Eliquis 2.'5mg'$  BID  Inpatient: Heparin infusion (11/24 > )  Allergies: No AC/APT related allergies  Assessment: 66 year old female with PMH hypertension, pAfib on Eliquis, CHF, and ESRD on HD MWF presents to ED with  complaints of chest tightness and SOB. Patient was reportedely unable to get dialysis Wednesday due to blood clots in dialysis port. Also Eliquis was reportedly discontinued on 10/30 but received Eliquis during recent hospital admission from 11/9 through 11/15. Will check confirmatory heparin level to check this. Pharmacy consulted for management of heparin infusion in the setting of DVT.  Date Time  HL Rate/Comment 11/24 2247 <0.1 Baseline with aPTT at 30s; correlation noted. 11/25 0814 0.51 Therapeutic x1; 1100 un/hr 11/25 1553 0.51 Therapeutic x 2 @ 1100 un/hr 11/26   0420    0.96     SUPRAtherapeutic   Goal of Therapy:  Heparin level 0.3-0.7 units/ml aPTT 66-102 seconds Monitor platelets by anticoagulation protocol: Yes  Plan:  11/26:  HL @ 0420 = 0.96, SUPRAtherapeutic  Will decrease heparin drip to 1000 units/hr and recheck HL  8 hrs after rate change.  Continue to monitor H&H and platelets daily while on heparin gtt.  Amber Williard D 12/02/2021 5:48 AM

## 2021-12-02 NOTE — Consult Note (Signed)
ANTICOAGULATION CONSULT NOTE - Initial Consult  Pharmacy Consult for heparin infusion Indication: DVT  Allergies  Allergen Reactions   Gabapentin Other (See Comments)    Seizure   Adhesive [Tape] Itching    Silk tape is ok to use.    Patient Measurements: Height: '5\' 5"'$  (165.1 cm) Weight: 55 kg (121 lb 4.1 oz) IBW/kg (Calculated) : 57 Heparin Dosing Weight: 65 kg  Vital Signs: Temp: 98.5 F (36.9 C) (11/26 1541) BP: 130/89 (11/26 2053) Pulse Rate: 77 (11/26 2053)  Labs: Recent Labs    11/30/21 0941 11/30/21 2247 11/30/21 2247 12/01/21 0814 12/01/21 1553 12/02/21 0420 12/02/21 1455 12/02/21 2254  HGB 10.7*  --   --  8.9*  --  8.0*  --   --   HCT 33.6*  --   --  27.4*  --  24.7*  --   --   PLT 219  --   --  154  --  150  --   --   APTT  --  30  --  87*  --   --   --   --   LABPROT 16.1*  --   --   --   --   --   --   --   INR 1.3*  --   --   --   --   --   --   --   HEPARINUNFRC  --  <0.10*   < > 0.51   < > 0.96* 0.42 0.35  CREATININE 8.35*  --   --  5.79*  --   --   --   --   TROPONINIHS 11  --   --   --   --   --   --   --    < > = values in this interval not displayed.     Estimated Creatinine Clearance: 8.2 mL/min (A) (by C-G formula based on SCr of 5.79 mg/dL (H)).   Medical History: Past Medical History:  Diagnosis Date   Anemia    Anxiety    Breast cancer ([place]) 01/2016   bilateral   CHF (congestive heart failure) (HCC)    Chronic kidney disease    Depression    Dialysis patient [facility])    DVT (deep venous thrombosis) (HCC)    left leg   DVT (deep venous thrombosis) ([place]) 1985   right thigh   Dysrhythmia    Gout    Headache    HTN (hypertension)    Hypertension    Parathyroid abnormality (HCC)    Parathyroid disease ([place])    Pneumonia 12/2015   Psoriasis    Renal insufficiency    Sickle cell trait (HCC)    traits    Medications:  PTA: Eliquis 2.'5mg'$  BID  Inpatient: Heparin infusion (11/24 > )  Allergies: No AC/APT related  allergies  Assessment: 66 year old female with PMH hypertension, pAfib on Eliquis, CHF, and ESRD on HD MWF presents to ED with complaints of chest tightness and SOB. Patient was reportedely unable to get dialysis Wednesday due to blood clots in dialysis port. Also Eliquis was reportedly discontinued on 10/30 but received Eliquis during recent hospital admission from 11/9 through 11/15. Will check confirmatory heparin level to check this. Pharmacy consulted for management of heparin infusion in the setting of DVT.  Date Time  HL Rate/Comment 11/24 2247 <0.1 Baseline with aPTT at 30s; correlation noted. 11/25 0814 0.51 Therapeutic x1; 1100 un/hr 11/25 1553 0.51 Therapeutic x 2 @  1100 un/hr 11/26   0420    0.96     SUPRAtherapeutic; 1100>1000 un/hr 11/26 1455 0.42 Therapeutic x1; 1000 un/hr 11/26   2254    0.35     Therapeutic X 2   Goal of Therapy:  Heparin level 0.3-0.7 units/ml aPTT 66-102 seconds Monitor platelets by anticoagulation protocol: Yes  Plan:  11/26:  HL @ 6803 = 0.35, therapeutic X 2 Will continue pt on current rate and recheck HL on 11/27 with AM labs.  Continue to monitor H&H and platelets daily while on heparin gtt.  Julienne Vogler D 12/02/2021 11:56 PM

## 2021-12-03 DIAGNOSIS — I82451 Acute embolism and thrombosis of right peroneal vein: Secondary | ICD-10-CM | POA: Diagnosis not present

## 2021-12-03 DIAGNOSIS — I5032 Chronic diastolic (congestive) heart failure: Secondary | ICD-10-CM | POA: Diagnosis not present

## 2021-12-03 DIAGNOSIS — Z992 Dependence on renal dialysis: Secondary | ICD-10-CM | POA: Diagnosis not present

## 2021-12-03 DIAGNOSIS — N186 End stage renal disease: Secondary | ICD-10-CM | POA: Diagnosis not present

## 2021-12-03 LAB — CBC
HCT: 25.1 % — ABNORMAL LOW (ref 36.0–46.0)
Hemoglobin: 8.2 g/dL — ABNORMAL LOW (ref 12.0–15.0)
MCH: 27.9 pg (ref 26.0–34.0)
MCHC: 32.7 g/dL (ref 30.0–36.0)
MCV: 85.4 fL (ref 80.0–100.0)
Platelets: 150 10*3/uL (ref 150–400)
RBC: 2.94 MIL/uL — ABNORMAL LOW (ref 3.87–5.11)
RDW: 19.2 % — ABNORMAL HIGH (ref 11.5–15.5)
WBC: 5.9 10*3/uL (ref 4.0–10.5)
nRBC: 0 % (ref 0.0–0.2)

## 2021-12-03 LAB — RENAL FUNCTION PANEL
Albumin: 2.8 g/dL — ABNORMAL LOW (ref 3.5–5.0)
Anion gap: 11 (ref 5–15)
BUN: 38 mg/dL — ABNORMAL HIGH (ref 8–23)
CO2: 25 mmol/L (ref 22–32)
Calcium: 9.8 mg/dL (ref 8.9–10.3)
Chloride: 102 mmol/L (ref 98–111)
Creatinine, Ser: 9.28 mg/dL — ABNORMAL HIGH (ref 0.44–1.00)
GFR, Estimated: 4 mL/min — ABNORMAL LOW (ref >60)
Glucose, Bld: 91 mg/dL (ref 70–99)
Phosphorus: 5 mg/dL — ABNORMAL HIGH (ref 2.5–4.6)
Potassium: 3.9 mmol/L (ref 3.5–5.1)
Sodium: 138 mmol/L (ref 135–145)

## 2021-12-03 LAB — HEPATITIS B SURFACE ANTIBODY, QUANTITATIVE: Hep B S AB Quant (Post): 19.2 m[IU]/mL (ref >9.9)

## 2021-12-03 LAB — HEPARIN LEVEL (UNFRACTIONATED): Heparin Unfractionated: 0.35 IU/mL (ref 0.30–0.70)

## 2021-12-03 MED ORDER — APIXABAN 2.5 MG PO TABS
2.5000 mg | ORAL_TABLET | Freq: Two times a day (BID) | ORAL | Status: DC
  Administered 2021-12-03 – 2021-12-05 (×4): 2.5 mg via ORAL
  Filled 2021-12-03 (×4): qty 1

## 2021-12-03 MED ORDER — APIXABAN 5 MG PO TABS: 5.0000 mg | ORAL_TABLET | Freq: Two times a day (BID) | ORAL | Status: DC

## 2021-12-03 MED ORDER — HEPARIN SODIUM (PORCINE) 1000 UNIT/ML IJ SOLN
INTRAMUSCULAR | Status: AC
  Filled 2021-12-03: qty 10

## 2021-12-03 MED ORDER — APIXABAN 5 MG PO TABS
10.0000 mg | ORAL_TABLET | Freq: Two times a day (BID) | ORAL | Status: DC
  Administered 2021-12-03: 10 mg via ORAL
  Filled 2021-12-03: qty 2

## 2021-12-03 NOTE — Plan of Care (Signed)

## 2021-12-03 NOTE — Progress Notes (Signed)
Pt completed 3 hour HD treatment w/ no complications. Alert, vss, no c/o and report to primary RN. Start: 0802 End: 1107 1067m fluid removed 63L BVP 52.5kg post hd standing weight No HD meds ordered

## 2021-12-03 NOTE — Progress Notes (Signed)
Pt HD start w/ no complications. Catheter lines reversed, BFR decreased to 350 d/t blood pump error on machine. Pt alert, watching tv, no c/o, stable, cont to monitor, access visible, safety maintained.

## 2021-12-03 NOTE — Progress Notes (Addendum)
Progress Note    Teresa Cook  PPJ:[id number] DOB: 1955/12/29  DOA: 11/30/2021 PCP: [facility] [place]      Brief Narrative:    Medical records reviewed and are as summarized below:  Teresa Cook is a 66 y.o. female with medical history significant for breast cancer status post bilateral mastectomy, seizure disorder, chronic diastolic dysfunction CHF, hypertension, anxiety with depression, end-stage renal disease on hemodialysis (M/W/F), recent discharge from the hospital (11/22/2021) after hospitalization for retroperitoneal bleed following a retinal artery hemorrhage in the setting of supratherapeutic INR s/p coil embolization of right renal artery.  His Eliquis had been held at discharge because of anemia and hematuria.  She presented to the emergency room from dialysis center because she was unable to get her scheduled renal replacement therapy.  She said the machine kept clotting off so she was referred to the emergency room for dialysis.  She complained of chest tightness, shortness of breath, pain in her right thigh and right calf.  She was found to have right lower extremity DVT (occlusive thrombus in the greater saphenous vein and peroneal vein and nonocclusive thrombus at the saphenofemoral junction).  She was treated with IV heparin drip.   Assessment/Plan:   Principal Problem:   ESRD on hemodialysis ([place]) Active Problems:   Deep venous thrombosis (DVT) of right peroneal vein (HCC)   Hypercalcemia   Hypertension   Moderate recurrent major depression (HCC)   PAF (paroxysmal atrial fibrillation) (HCC)   Chronic diastolic CHF (congestive heart failure) (HCC)    Body mass index is 19.33 kg/m.    Right lower extremity DVT Patient with a history of VTE who was on chronic anticoagulation with Coumadin and was recently hospitalized for right renal artery hemorrhage in the setting of a supratherapeutic INR. Patient's anticoagulation was placed  on hold on discharge after risk/benefit discussion between IM, cardiology, nephrology. Now with occlusive thrombus in the greater saphenous vein and peroneal vein. Nonocclusive thrombus at the saphenofemoral junction. IV heparin switched to Eliquis.  Consulted Dr. Lucky Cowboy, vascular surgeon, to weigh in on long-term anticoagulation with Eliquis because of reported urethral bleeding and recent renal artery hemorrhage.  Consulted Dr. Bernardo Heater, urologist for urethral bleeding.  ESRD on MWF schedule She had hemodialysis today.  Follow-up with nephrologist   Hypercalcemia Improved  Chronic diastolic CHF (congestive heart failure, hypertension Compensated.  Continue antihypertensives   PAF (paroxysmal atrial fibrillation) (HCC) Continue amiodarone and carvedilol   Moderate recurrent major depression (HCC) Continue sertraline and trazodone    Chronic anemia Overall, downward trending hemoglobin since admission but H&H is stable.  Monitor H&H on Eliquis.    Diet Order             Diet Heart Room service appropriate? Yes; Fluid consistency: Thin; Fluid restriction: 1500 mL Fluid  Diet effective now                            Consultants: Vascular surgeon Nephrologist  Procedures: None    Medications:    amiodarone  200 mg Oral BID   apixaban  10 mg Oral BID   Followed by   Derrill Memo ON 12/10/2021] apixaban  5 mg Oral BID   carvedilol  12.5 mg Oral BID   Chlorhexidine Gluconate Cloth  6 each Topical Q0600   hydrALAZINE  25 mg Oral TID   multivitamin  1 tablet Oral QHS   pantoprazole  40 mg Oral BID  sacubitril-valsartan  1 tablet Oral BID   sertraline  100 mg Oral Daily   sevelamer carbonate  2,400 mg Oral TID with meals   sodium chloride flush  3 mL Intravenous Q12H   traZODone  50 mg Oral QHS   zolpidem  5 mg Oral QHS   Continuous Infusions:  sodium chloride 10 mL/hr at 12/03/21 0750     Anti-infectives (From admission, onward)    None               Family Communication/Anticipated D/C date and plan/Code Status   DVT prophylaxis:  apixaban (ELIQUIS) tablet 10 mg  apixaban (ELIQUIS) tablet 5 mg     Code Status: Full Code  Family Communication: None Disposition Plan: Plan to discharge home tomorrow   Status is: Inpatient Remains inpatient appropriate because: Monitor patient on Eliquis       Subjective:   Interval events noted. She complains of bleeding from the urethra.  She states she does not produce urine but she is stating that the blood is coming from her urethra.  This normally occurs when she has a bowel movement.  No other complaints.  No dizziness, chest pain or shortness of breath.  Objective:    Vitals:   12/03/21 1030 12/03/21 1107 12/03/21 1112 12/03/21 1150  BP: 103/67 96/68 108/68   Pulse: 74 72 74   Resp: '18 16 18   '$ Temp:  98.1 F (36.7 C)    TempSrc:  Oral    SpO2: 99% 100% 100%   Weight:    52.7 kg  Height:       No data found.   Intake/Output Summary (Last 24 hours) at 12/03/2021 1303 Last data filed at 12/03/2021 1107 Gross per 24 hour  Intake 318.3 ml  Output 1000 ml  Net -681.7 ml   Filed Weights   11/30/21 1531 12/03/21 0752 12/03/21 1150  Weight: 55 kg 53.5 kg 52.7 kg    Exam:  GEN: NAD SKIN: Warm and dry EYES: EOMI ENT: MMM CV: RRR PULM: CTA B ABD: soft, ND, NT, +BS CNS: AAO x 3, non focal EXT: No edema or tenderness       Data Reviewed:   I have personally reviewed following labs and imaging studies:  Labs: Labs show the following:   Basic Metabolic Panel: Recent Labs  Lab 11/30/21 0941 12/01/21 0814 12/03/21 0759  NA 143 140 138  K 3.5 3.8 3.9  CL 106 102 102  CO2 '23 25 25  '$ GLUCOSE 100* 81 91  BUN 30* 20 38*  CREATININE 8.35* 5.79* 9.28*  CALCIUM 10.7* 10.1 9.8  MG 2.2  --   --   PHOS  --   --  5.0*   GFR Estimated Creatinine Clearance: 4.9 mL/min (A) (by C-G formula based on SCr of 9.28 mg/dL (H)). Liver Function  Tests: Recent Labs  Lab 12/03/21 0759  ALBUMIN 2.8*   No results for input(s): "LIPASE", "AMYLASE" in the last 168 hours. No results for input(s): "AMMONIA" in the last 168 hours. Coagulation profile Recent Labs  Lab 11/30/21 0941  INR 1.3*    CBC: Recent Labs  Lab 11/30/21 0941 12/01/21 0814 12/02/21 0420 12/03/21 0445  WBC 10.8* 6.6 6.1 5.9  HGB 10.7* 8.9* 8.0* 8.2*  HCT 33.6* 27.4* 24.7* 25.1*  MCV 86.6 85.4 85.2 85.4  PLT 219 154 150 150   Cardiac Enzymes: No results for input(s): "CKTOTAL", "CKMB", "CKMBINDEX", "TROPONINI" in the last 168 hours. BNP (last 3 results) No results  for input(s): "PROBNP" in the last 8760 hours. CBG: Recent Labs  Lab 11/30/21 2253  GLUCAP 80   D-Dimer: No results for input(s): "DDIMER" in the last 72 hours. Hgb A1c: No results for input(s): "HGBA1C" in the last 72 hours. Lipid Profile: No results for input(s): "CHOL", "HDL", "LDLCALC", "TRIG", "CHOLHDL", "LDLDIRECT" in the last 72 hours. Thyroid function studies: No results for input(s): "TSH", "T4TOTAL", "T3FREE", "THYROIDAB" in the last 72 hours.  Invalid input(s): "FREET3" Anemia work up: No results for input(s): "VITAMINB12", "FOLATE", "FERRITIN", "TIBC", "IRON", "RETICCTPCT" in the last 72 hours. Sepsis Labs: Recent Labs  Lab 11/30/21 0941 12/01/21 0814 12/02/21 0420 12/03/21 0445  WBC 10.8* 6.6 6.1 5.9    Microbiology No results found for this or any previous visit (from the past 240 hour(s)).  Procedures and diagnostic studies:  No results found.             LOS: 2 days   Mehar Sagen  [facility]   Pager on [email or web address]. If 7PM-7AM, please contact night-coverage at [email or web address]     12/03/2021, 1:03 PM

## 2021-12-03 NOTE — Progress Notes (Signed)
[facility]  ROUNDING NOTE   Subjective:   Teresa Cook is a 66 year old African-American female with past medical conditions including hypertension, gout, anxiety with depression, sickle cell trait, systolic heart failure with a EF 40 to 45%, breast cancer with bilateral mastectomy, seizures, and end-stage renal disease on hemodialysis.  Patient presents to the emergency department via EMS for multiple syncopal episodes.  Patient has been admitted for Acute deep vein thrombosis (DVT) of proximal vein of right lower extremity (HCC) [I82.4Y1] Deep venous thrombosis (DVT) of right peroneal vein ([place]) [I82.451]  Patient is known to our practice and receives outpatient dialysis treatments at [facility] on a MWF schedule, supervised by Dr. Candiss Norse.    Patient seen and evaluated during dialysis   HEMODIALYSIS FLOWSHEET:  Blood Flow Rate (mL/min): 350 mL/min Arterial Pressure (mmHg): -160 mmHg Venous Pressure (mmHg): 240 mmHg TMP (mmHg): 11 mmHg Ultrafiltration Rate (mL/min): 556 mL/min Dialysate Flow Rate (mL/min): 300 ml/min Dialysis Fluid Bolus: Normal Saline  No complaints at this time  Objective:  Vital signs in last 24 hours:  Temp:  [97.7 F (36.5 C)-98.5 F (36.9 C)] 98.1 F (36.7 C) (11/27 1107) Pulse Rate:  [62-77] 74 (11/27 1112) Resp:  [15-20] 18 (11/27 1112) BP: (96-139)/(57-89) 108/68 (11/27 1112) SpO2:  [96 %-100 %] 100 % (11/27 1112) Weight:  [53.5 kg] 53.5 kg (11/27 0752)  Weight change:  Filed Weights   11/30/21 1437 11/30/21 1531 12/03/21 0752  Weight: 65 kg 55 kg 53.5 kg     Intake/Output: I/O last 3 completed shifts: In: 167.8 [I.V.:167.8] Out: 1 [Urine:1]   Intake/Output this shift:  Total I/O In: 150.5 [I.V.:150.5] Out: 1000 [Other:1000]  Physical Exam: General: NAD  Head: Normocephalic, atraumatic. Moist oral mucosal membranes  Eyes: Anicteric  Lungs:  Clear to auscultation, normal effort, room air  Heart: Regular  rate and rhythm  Abdomen:  Soft, tender, BS present  Extremities:  No peripheral edema.  Neurologic: Nonfocal, moving all four extremities  Skin: No lesions  Access: Lt Permcath    Basic Metabolic Panel: Recent Labs  Lab 11/30/21 0941 12/01/21 0814 12/03/21 0759  NA 143 140 138  K 3.5 3.8 3.9  CL 106 102 102  CO2 '23 25 25  '$ GLUCOSE 100* 81 91  BUN 30* 20 38*  CREATININE 8.35* 5.79* 9.28*  CALCIUM 10.7* 10.1 9.8  MG 2.2  --   --   PHOS  --   --  5.0*     Liver Function Tests: Recent Labs  Lab 12/03/21 0759  ALBUMIN 2.8*   No results for input(s): "LIPASE", "AMYLASE" in the last 168 hours. No results for input(s): "AMMONIA" in the last 168 hours.  CBC: Recent Labs  Lab 11/30/21 0941 12/01/21 0814 12/02/21 0420 12/03/21 0445  WBC 10.8* 6.6 6.1 5.9  HGB 10.7* 8.9* 8.0* 8.2*  HCT 33.6* 27.4* 24.7* 25.1*  MCV 86.6 85.4 85.2 85.4  PLT 219 154 150 150     Cardiac Enzymes: No results for input(s): "CKTOTAL", "CKMB", "CKMBINDEX", "TROPONINI" in the last 168 hours.  BNP: Invalid input(s): "POCBNP"  CBG: Recent Labs  Lab 11/30/21 2253  GLUCAP 82      Microbiology: Results for orders placed or performed during the hospital encounter of 11/05/21  Blood Culture (routine x 2)     Status: None   Collection Time: 11/05/21  3:20 AM   Specimen: BLOOD  Result Value Ref Range Status   Specimen Description BLOOD RIGHT FA  Final   Special  Requests   Final    BOTTLES DRAWN AEROBIC AND ANAEROBIC Blood Culture results may not be optimal due to an inadequate volume of blood received in culture bottles   Culture   Final    NO GROWTH 5 DAYS Performed at [facility], [address] [place], [place] [address]    Report Status 11/10/2021 FINAL  Final  SARS Coronavirus 2 by RT PCR (hospital order, performed in [facility] hospital lab) *cepheid single result test* Anterior Nasal Swab     Status: None   Collection Time: 11/05/21  3:20 AM   Specimen: Anterior  Nasal Swab  Result Value Ref Range Status   SARS Coronavirus 2 by RT PCR NEGATIVE NEGATIVE Final    Comment: (NOTE) SARS-CoV-2 target nucleic acids are NOT DETECTED.  The SARS-CoV-2 RNA is generally detectable in upper and lower respiratory specimens during the acute phase of infection. The lowest concentration of SARS-CoV-2 viral copies this assay can detect is 250 copies / mL. A negative result does not preclude SARS-CoV-2 infection and should not be used as the sole basis for treatment or other patient management decisions.  A negative result may occur with improper specimen collection / handling, submission of specimen other than nasopharyngeal swab, presence of viral mutation(s) within the areas targeted by this assay, and inadequate number of viral copies (<250 copies / mL). A negative result must be combined with clinical observations, patient history, and epidemiological information.  Fact Sheet for Patients:   https://www.patel.info/  Fact Sheet for Healthcare Providers: https://hall.com/  This test is not yet approved or  cleared by the [place] FDA and has been authorized for detection and/or diagnosis of SARS-CoV-2 by FDA under an Emergency Use Authorization (EUA).  This EUA will remain in effect (meaning this test can be used) for the duration of the COVID-19 declaration under Section 564(b)(1) of the Act, 21 U.S.C. section 360bbb-3(b)(1), unless the authorization is terminated or revoked sooner.  Performed at [facility], [address] [place], [place] [address]   Blood Culture (routine x 2)     Status: None   Collection Time: 11/05/21  3:21 AM   Specimen: BLOOD  Result Value Ref Range Status   Specimen Description BLOOD RIGHT HAND  Final   Special Requests   Final    BOTTLES DRAWN AEROBIC AND ANAEROBIC Blood Culture results may not be optimal due to an inadequate volume of blood received in culture  bottles   Culture   Final    NO GROWTH 5 DAYS Performed at [facility], [address] [place], [place] [address]    Report Status 11/10/2021 FINAL  Final  MRSA Next Gen by PCR, Nasal     Status: None   Collection Time: 11/05/21  6:57 AM   Specimen: Nasal Mucosa; Nasal Swab  Result Value Ref Range Status   MRSA by PCR Next Gen NOT DETECTED NOT DETECTED Final    Comment: (NOTE) The GeneXpert MRSA Assay (FDA approved for NASAL specimens only), is one component of a comprehensive MRSA colonization surveillance program. It is not intended to diagnose MRSA infection nor to guide or monitor treatment for MRSA infections. Test performance is not FDA approved in patients less than 23 years old. Performed at [facility], [address] [place], [place] [address]     Coagulation Studies: No results for input(s): "LABPROT", "INR" in the last 72 hours.   Urinalysis: No results for input(s): "COLORURINE", "LABSPEC", "PHURINE", "GLUCOSEU", "HGBUR", "BILIRUBINUR", "KETONESUR", "  PROTEINUR", "UROBILINOGEN", "NITRITE", "LEUKOCYTESUR" in the last 72 hours.  Invalid input(s): "APPERANCEUR"     Imaging: No results found.   Medications:    sodium chloride 10 mL/hr at 12/03/21 0750    heparin sodium (porcine)       amiodarone  200 mg Oral BID   apixaban  10 mg Oral BID   Followed by   Derrill Memo ON 12/10/2021] apixaban  5 mg Oral BID   carvedilol  12.5 mg Oral BID   Chlorhexidine Gluconate Cloth  6 each Topical Q0600   hydrALAZINE  25 mg Oral TID   multivitamin  1 tablet Oral QHS   pantoprazole  40 mg Oral BID   sacubitril-valsartan  1 tablet Oral BID   sertraline  100 mg Oral Daily   sevelamer carbonate  2,400 mg Oral TID with meals   sodium chloride flush  3 mL Intravenous Q12H   traZODone  50 mg Oral QHS   zolpidem  5 mg Oral QHS   heparin sodium (porcine), sodium chloride, acetaminophen **OR** acetaminophen, ALPRAZolam, ondansetron **OR** ondansetron (ZOFRAN)  IV, oxyCODONE, polyethylene glycol, sodium chloride flush  Assessment/ Plan:  Ms. Ludwika Rodd is a 66 y.o.  female is a 66 year old African-American female with past medical conditions including hypertension, gout, anxiety with depression, sickle cell trait, systolic heart failure with a EF 40 to 45%, breast cancer with bilateral mastectomy, seizures, and end-stage renal disease on hemodialysis.  Patient presents to the emergency department via EMS for multiple syncopal episodes.  Patient has been admitted for Acute deep vein thrombosis (DVT) of proximal vein of right lower extremity (HCC) [I82.4Y1] Deep venous thrombosis (DVT) of right peroneal vein (HCC) [I82.451]  [facility]/MWF/Lt Permcath  End-stage renal disease on hemodialysis.   Patient is on Monday Wednesday Friday schedule. Patient receiving dialysis, UF 1L as tolerated. Next treatment scheduled for Wednesday.   2. Anemia of chronic kidney disease  Lab Results  Component Value Date   HGB 8.2 (L) 12/03/2021  During last admission patient had severe hemorrhage in the right kidney requiring coil embolization.  Hgb below desired target. Patient reports continued dark stools since discharge. [facility] outpatient. Will consider ESA during this admission.    3. Secondary Hyperparathyroidism: with outpatient labs: PTH 379, phosphorus 5.7, calcium 9.4 on June 18, 2021.   Lab Results  Component Value Date   PTH 214 (H) 07/08/2021   CALCIUM 9.8 12/03/2021   PHOS 5.0 (H) 12/03/2021   Continue sevelamer with all meals.  4.  Hypertension With chronic kidney disease Currently receiving amiodarone, carvedilol, hydralazine, and Entresto.  Blood pressure 108/68.  5.  .Atrial fibrillation now admitted with DVT Patient has history of atrial fibrillation, and now is being admitted with DVT Patiently currently prescribed apixaban.       LOS: 2 Arshia Spellman 11/27/202311:16 AM

## 2021-12-03 NOTE — Progress Notes (Signed)
Post hd rn assessment 

## 2021-12-03 NOTE — Consult Note (Signed)
ANTICOAGULATION CONSULT NOTE - Initial Consult  Pharmacy Consult for heparin infusion Indication: DVT  Allergies  Allergen Reactions   Gabapentin Other (See Comments)    Seizure   Adhesive [Tape] Itching    Silk tape is ok to use.    Patient Measurements: Height: '5\' 5"'$  (165.1 cm) Weight: 55 kg (121 lb 4.1 oz) IBW/kg (Calculated) : 57 Heparin Dosing Weight: 65 kg  Vital Signs: Temp: 97.9 F (36.6 C) (11/27 0039) BP: 97/57 (11/27 0039) Pulse Rate: 67 (11/27 0039)  Labs: Recent Labs    11/30/21 0941 11/30/21 0941 11/30/21 2247 12/01/21 0814 12/01/21 1553 12/02/21 0420 12/02/21 1455 12/02/21 2254 12/03/21 0445  HGB 10.7*  --   --  8.9*  --  8.0*  --   --  8.2*  HCT 33.6*  --   --  27.4*  --  24.7*  --   --  25.1*  PLT 219  --   --  154  --  150  --   --  150  APTT  --   --  30 87*  --   --   --   --   --   LABPROT 16.1*  --   --   --   --   --   --   --   --   INR 1.3*  --   --   --   --   --   --   --   --   HEPARINUNFRC  --    < > <0.10* 0.51   < > 0.96* 0.42 0.35 0.35  CREATININE 8.35*  --   --  5.79*  --   --   --   --   --   TROPONINIHS 11  --   --   --   --   --   --   --   --    < > = values in this interval not displayed.     Estimated Creatinine Clearance: 8.2 mL/min (A) (by C-G formula based on SCr of 5.79 mg/dL (H)).   Medical History: Past Medical History:  Diagnosis Date   Anemia    Anxiety    Breast cancer ([place]) 01/2016   bilateral   CHF (congestive heart failure) (HCC)    Chronic kidney disease    Depression    Dialysis patient [facility])    DVT (deep venous thrombosis) (HCC)    left leg   DVT (deep venous thrombosis) ([place]) 1985   right thigh   Dysrhythmia    Gout    Headache    HTN (hypertension)    Hypertension    Parathyroid abnormality (HCC)    Parathyroid disease ([place])    Pneumonia 12/2015   Psoriasis    Renal insufficiency    Sickle cell trait (HCC)    traits    Medications:  PTA: Eliquis 2.'5mg'$  BID  Inpatient: Heparin  infusion (11/24 > )  Allergies: No AC/APT related allergies  Assessment: 66 year old female with PMH hypertension, pAfib on Eliquis, CHF, and ESRD on HD MWF presents to ED with complaints of chest tightness and SOB. Patient was reportedely unable to get dialysis Wednesday due to blood clots in dialysis port. Also Eliquis was reportedly discontinued on 10/30 but received Eliquis during recent hospital admission from 11/9 through 11/15. Will check confirmatory heparin level to check this. Pharmacy consulted for management of heparin infusion in the setting of DVT.  Date Time  HL Rate/Comment 11/24 2247 <  0.1 Baseline with aPTT at 30s; correlation noted. 11/25 0814 0.51 Therapeutic x1; 1100 un/hr 11/25 1553 0.51 Therapeutic x 2 @ 1100 un/hr 11/26   0420    0.96     SUPRAtherapeutic; 1100>1000 un/hr 11/26 1455 0.42 Therapeutic x1; 1000 un/hr 11/26   2254    0.35     Therapeutic X 2  11/27   0445    0.35     Therapeutic X 3   Goal of Therapy:  Heparin level 0.3-0.7 units/ml aPTT 66-102 seconds Monitor platelets by anticoagulation protocol: Yes  Plan:  11/27:  HL @ 9735 = 0.35, therapeutic X 3  Will continue pt on current rate and recheck HL on 11/28 with AM labs.  Continue to monitor H&H and platelets daily while on heparin gtt.  Maitland Muhlbauer D 12/03/2021 5:30 AM

## 2021-12-03 NOTE — Progress Notes (Signed)
Pre hd rn assessment 

## 2021-12-04 DIAGNOSIS — I82451 Acute embolism and thrombosis of right peroneal vein: Secondary | ICD-10-CM | POA: Diagnosis not present

## 2021-12-04 DIAGNOSIS — N186 End stage renal disease: Secondary | ICD-10-CM | POA: Diagnosis not present

## 2021-12-04 DIAGNOSIS — N368 Other specified disorders of urethra: Secondary | ICD-10-CM | POA: Diagnosis not present

## 2021-12-04 DIAGNOSIS — Z992 Dependence on renal dialysis: Secondary | ICD-10-CM | POA: Diagnosis not present

## 2021-12-04 DIAGNOSIS — R319 Hematuria, unspecified: Secondary | ICD-10-CM | POA: Diagnosis not present

## 2021-12-04 LAB — TYPE AND SCREEN
ABO/RH(D): O POS
Antibody Screen: POSITIVE
Unit division: 0
Unit division: 0

## 2021-12-04 LAB — BPAM RBC
Blood Product Expiration Date: 202312042359
Blood Product Expiration Date: 202312262359
Unit Type and Rh: 5100
Unit Type and Rh: 5100

## 2021-12-04 LAB — HEMOGLOBIN AND HEMATOCRIT, BLOOD
HCT: 25.8 % — ABNORMAL LOW (ref 36.0–46.0)
Hemoglobin: 8.3 g/dL — ABNORMAL LOW (ref 12.0–15.0)

## 2021-12-04 NOTE — Consult Note (Signed)
Urology Consult  Requesting physician: Neurology Jennye Boroughs, MD  Reason for consultation: Hematuria/urethral bleeding  Chief Complaint: Urethral bleeding  History of Present Illness: Teresa Cook is a 66 y.o. female with a history of end-stage renal disease on hemodialysis.  She was hospitalized November 2023 for a large retroperitoneal bleed secondary to renal artery hemorrhage and underwent coil embolization of the right renal artery she was initially admitted for hemodialysis due to problems with dialysis as an outpatient.  Was found to have RLE DVT.  Started on heparin drip and recently switched to Eliquis.  Has been on hemodialysis for several years and has been essentially  anuric.  After her recent hospitalization she began to note blood per urethra and has passed blood per urethra.  Denies flank, abdominal or pelvic pain.  She has not had any follow-up CT imaging since her last hospitalization and since her bleeding started  Past Medical History:  Diagnosis Date   Anemia    Anxiety    Breast cancer ([place]) 01/2016   bilateral   CHF (congestive heart failure) ([place])    Chronic kidney disease    Depression    Dialysis patient [facility])    DVT (deep venous thrombosis) ([place])    left leg   DVT (deep venous thrombosis) ([place]) 1985   right thigh   Dysrhythmia    Gout    Headache    HTN (hypertension)    Hypertension    Parathyroid abnormality (HCC)    Parathyroid disease ([place])    Pneumonia 12/2015   Psoriasis    Renal insufficiency    Sickle cell trait ([place])    traits    Past Surgical History:  Procedure Laterality Date   ABDOMINAL HYSTERECTOMY  1980   APPENDECTOMY     BREAST BIOPSY Left 10/28/2013   benign   BREAST EXCISIONAL BIOPSY Left 2002   benign   CORONARY ANGIOGRAPHY N/A 04/04/2020   Procedure: CORONARY ANGIOGRAPHY;  Surgeon: Wellington Hampshire, MD;  Location: [place] [facility];  Service: Cardiovascular;  Laterality: N/A;   INSERTION OF DIALYSIS  CATHETER  2014   LIPOMA EXCISION N/A 01/23/2016   Procedure: EXCISION LIPOMA;  Surgeon: Hubbard Robinson, MD;  Location: [facility];  Service: General;  Laterality: N/A;   MASTECTOMY W/ SENTINEL NODE BIOPSY Bilateral 01/23/2016   Procedure: bilateral MASTECTOMY WITH  bilateral SENTINEL LYMPH NODE BIOPSY possible left axillary node dissection forehead lipoma removal;  Surgeon: Hubbard Robinson, MD;  Location: [facility] OR[facility];  Service: General;  Laterality: Bilateral;   PARTIAL HYSTERECTOMY     PERIPHERAL VASCULAR CATHETERIZATION N/A 12/25/2015   Procedure: Dialysis/Perma Catheter Insertion;  Surgeon: Algernon Huxley, MD;  Location: [place] [facility];  Service: Cardiovascular;  Laterality: N/A;   PERIPHERAL VASCULAR CATHETERIZATION Left 01/22/2016   Procedure: Dialysis/Perma Catheter Insertion;  Surgeon: Algernon Huxley, MD;  Location: [place] [facility];  Service: Cardiovascular;  Laterality: Left;   PERIPHERAL VASCULAR CATHETERIZATION N/A 01/26/2016   Procedure: Dialysis/Perma Catheter Insertion;  Surgeon: Katha Cabal, MD;  Location: [place] [facility];  Service: Cardiovascular;  Laterality: N/A;   PORT-A-CATH REMOVAL N/A 12/20/2015   Procedure: REMOVAL PORT-A-CATH;  Surgeon: Hubbard Robinson, MD;  Location: [facility];  Service: General;  Laterality: N/A;  left     PORTACATH PLACEMENT Left 08/21/2015   Procedure: INSERTION PORT-A-CATH;  Surgeon: Hubbard Robinson, MD;  Location: [facility];  Service: General;  Laterality: Left;   REMOVAL OF A DIALYSIS CATHETER  2017  RENAL ANGIOGRAPHY Right 11/05/2021   Procedure: RENAL ANGIOGRAPHY;  Surgeon: Algernon Huxley, MD;  Location: [place] [facility];  Service: Cardiovascular;  Laterality: Right;  with embolization   RIGHT HEART CATH N/A 04/04/2020   Procedure: RIGHT HEART CATH;  Surgeon: Wellington Hampshire, MD;  Location: [place] [facility];  Service: Cardiovascular;  Laterality: N/A;    Home Medications:  Current Meds  Medication Sig   amiodarone  (PACERONE) 200 MG tablet Take 1 tablet (200 mg total) by mouth 2 (two) times daily.   b complex-vitamin c-folic acid (NEPHRO-VITE) 0.8 MG TABS tablet Take 1 tablet by mouth daily.   carvedilol (COREG) 12.5 MG tablet Take 1 tablet (12.5 mg total) by mouth 2 (two) times daily.   hydrALAZINE (APRESOLINE) 25 MG tablet Take 1 tablet (25 mg total) by mouth 3 (three) times daily.   pantoprazole (PROTONIX) 40 MG tablet Take 1 tablet (40 mg total) by mouth 2 (two) times daily.   sacubitril-valsartan (ENTRESTO) 24-26 MG Take 1 tablet by mouth 2 (two) times daily.   sertraline (ZOLOFT) 100 MG tablet Take 100 mg by mouth daily.   zolpidem (AMBIEN) 10 MG tablet Take 0.5 tablets (5 mg total) by mouth at bedtime.    Allergies:  Allergies  Allergen Reactions   Gabapentin Other (See Comments)    Seizure   Adhesive [Tape] Itching    Silk tape is ok to use.    Family History  Problem Relation Age of Onset   Stroke Mother    CVA Mother    Hypertension Mother    Hypertension Father    Hypertension Sister    Diabetes Sister    Cancer Sister 63       Breast   Stroke Brother    Hypertension Brother    Breast cancer Maternal Aunt 70    Social History:  reports that she has never smoked. She has never used smokeless tobacco. She reports that she does not drink alcohol and does not use drugs.  ROS: A complete review of systems was performed.  All systems are negative except for pertinent findings as noted.  Physical Exam:  Vital signs in last 24 hours: Temp:  [98.2 F (36.8 C)-98.4 F (36.9 C)] 98.4 F (36.9 C) (11/28 1633) Pulse Rate:  [70-81] 71 (11/28 1633) Resp:  [16-17] 16 (11/28 1633) BP: (90-125)/(58-76) 125/76 (11/28 1633) SpO2:  [96 %-99 %] 99 % (11/28 1633) Constitutional:  Alert, No acute distress HEENT: [place] AT Cardiovascular: Regular rate and rhythm. Respiratory: Normal respiratory effort, lungs clear bilaterally GI: Abdomen is soft, nontender, nondistended, no abdominal  masses Skin: No rashes, bruises or suspicious lesions Lymph: No cervical or inguinal adenopathy Neurologic: Grossly intact, no focal deficits, moving all 4 extremities Psychiatric: Normal mood and affect   Laboratory Data:  Recent Labs    12/02/21 0420 12/03/21 0445 12/04/21 0638  WBC 6.1 5.9  --   HGB 8.0* 8.2* 8.3*  HCT 24.7* 25.1* 25.8*   Recent Labs    12/03/21 0759  NA 138  K 3.9  CL 102  CO2 25  GLUCOSE 91  BUN 38*  CREATININE 9.28*  CALCIUM 9.8     Impression/Assessment:  66 y.o. female with a 10-day history of blood per urethra.  She has anuric on hemodialysis  Recommendation:  CT abdomen pelvis to assess for any possibility of upper tract etiology and degree of blood/clot Cystoscopy with possible bladder biopsy/TURBT tentatively scheduled for Thursday, 12/06/2021   12/04/2021, 8:14 PM  John Giovanni,  MD

## 2021-12-04 NOTE — Care Management Important Message (Signed)
Important Message  Patient Details  Name: Teresa Cook MRN: [id number] Date of Birth: 1955/03/11   Medicare Important Message Given:  N/A - LOS <3 / Initial given by admissions     Juliann Pulse A Garry Bochicchio 12/04/2021, 10:56 AM

## 2021-12-04 NOTE — Progress Notes (Addendum)
Progress Note    Teresa Cook  AJO:[id number] DOB: [birth year]  DOA: 11/30/2021 PCP: [facility]      Brief Narrative:    Medical records reviewed and are as summarized below:  Teresa Cook is a 66 y.o. female with medical history significant for breast cancer status post bilateral mastectomy, seizure disorder, chronic diastolic dysfunction CHF, hypertension, anxiety with depression, end-stage renal disease on hemodialysis (M/W/F), recent discharge from the hospital (11/22/2021) after hospitalization for retroperitoneal bleed following a retinal artery hemorrhage in the setting of supratherapeutic INR s/p coil embolization of right renal artery.  His Eliquis had been held at discharge because of anemia and hematuria.  She presented to the emergency room from dialysis center because she was unable to get her scheduled renal replacement therapy.  She said the machine kept clotting off so she was referred to the emergency room for dialysis.  She complained of chest tightness, shortness of breath, pain in her right thigh and right calf.  She was found to have right lower extremity DVT (occlusive thrombus in the greater saphenous vein and peroneal vein and nonocclusive thrombus at the saphenofemoral junction).  She was treated with IV heparin drip.   Assessment/Plan:   Principal Problem:   ESRD on hemodialysis ([place]) Active Problems:   Deep venous thrombosis (DVT) of right peroneal vein (HCC)   Hypercalcemia   Hypertension   Moderate recurrent major depression (HCC)   PAF (paroxysmal atrial fibrillation) (HCC)   Chronic diastolic CHF (congestive heart failure) (HCC)   Urethral bleeding    Body mass index is 19.33 kg/m.    Right lower extremity DVT Patient with a history of VTE who was on chronic anticoagulation with Coumadin and was recently hospitalized for right renal artery hemorrhage in the setting of a supratherapeutic INR. Patient's  anticoagulation was placed on hold on discharge after risk/benefit discussion between IM, cardiology, nephrology. Case discussed with vascular surgery team.  Vascular surgeon recommended decreasing Eliquis from 5 mg to 2.5 mg twice daily because she is not a good candidate for IVC filter because she may develop thrombosis of IVC filter because of hypercoagulability.    Urethral bleeding Consulted Dr. Bernardo Heater.  He is planning to do cystoscopy this week based on OR availability.  Hypotension BP has been running low.  Discontinue hydralazine.  Continue carvedilol and Entresto for now.  Antihypertensives may need to be adjusted depending on her blood pressure   ESRD on MWF schedule Plan for hemodialysis tomorrow.  Follow-up with nephrologist.   Hypercalcemia Improved  Chronic diastolic CHF (congestive heart failure, hypertension Compensated.  Continue antihypertensives   PAF (paroxysmal atrial fibrillation) (HCC) Continue amiodarone and carvedilol   Moderate recurrent major depression (HCC) Continue sertraline and trazodone    Chronic anemia Overall, downward trending hemoglobin since admission but H&H is stable.  Monitor H&H on Eliquis.  Stage II decubitus ulcers on bilateral buttocks More prominent on the right buttock.  These were present on admission and now healing.    Diet Order             Diet Heart Room service appropriate? Yes; Fluid consistency: Thin; Fluid restriction: 1500 mL Fluid  Diet effective now                            Consultants: Vascular surgeon Nephrologist Urologist  Procedures: None    Medications:    amiodarone  200 mg Oral BID  apixaban  2.5 mg Oral BID   carvedilol  12.5 mg Oral BID   Chlorhexidine Gluconate Cloth  6 each Topical Q0600   multivitamin  1 tablet Oral QHS   pantoprazole  40 mg Oral BID   sacubitril-valsartan  1 tablet Oral BID   sertraline  100 mg Oral Daily   sevelamer carbonate  2,400 mg Oral TID  with meals   sodium chloride flush  3 mL Intravenous Q12H   traZODone  50 mg Oral QHS   zolpidem  5 mg Oral QHS   Continuous Infusions:  sodium chloride 10 mL/hr at 12/03/21 0750     Anti-infectives (From admission, onward)    None              Family Communication/Anticipated D/C date and plan/Code Status   DVT prophylaxis: apixaban (ELIQUIS) tablet 2.5 mg Start: 12/03/21 2200 apixaban (ELIQUIS) tablet 2.5 mg     Code Status: Full Code  Family Communication: None Disposition Plan: Plan to discharge home tomorrow   Status is: Inpatient Remains inpatient appropriate because: Monitor patient on Eliquis       Subjective:   She still complains of urethral bleeding.  No other problems.  Objective:    Vitals:   12/03/21 1612 12/03/21 2127 12/04/21 0004 12/04/21 0826  BP: 122/77 119/62 (!) 90/58 109/72  Pulse: 79 70 81 73  Resp: '16  17 16  '$ Temp: 97.7 F (36.5 C)  98.4 F (36.9 C) 98.2 F (36.8 C)  TempSrc:      SpO2: 100%  97% 96%  Weight:      Height:       No data found.   Intake/Output Summary (Last 24 hours) at 12/04/2021 1513 Last data filed at 12/04/2021 1444 Gross per 24 hour  Intake 120 ml  Output 0 ml  Net 120 ml   Filed Weights   11/30/21 1531 12/03/21 0752 12/03/21 1150  Weight: 55 kg 53.5 kg 52.7 kg    Exam:  GEN: NAD SKIN: Warm and dry.  Healing stage II bilateral decubitus ulcers more prominent on the right buttock  EYES: EOMI ENT: MMM CV: RRR PULM: CTA B ABD: soft, ND, NT, +BS CNS: AAO x 3, non focal EXT: No edema or tenderness        Data Reviewed:   I have personally reviewed following labs and imaging studies:  Labs: Labs show the following:   Basic Metabolic Panel: Recent Labs  Lab 11/30/21 0941 12/01/21 0814 12/03/21 0759  NA 143 140 138  K 3.5 3.8 3.9  CL 106 102 102  CO2 '23 25 25  '$ GLUCOSE 100* 81 91  BUN 30* 20 38*  CREATININE 8.35* 5.79* 9.28*  CALCIUM 10.7* 10.1 9.8  MG 2.2  --   --    PHOS  --   --  5.0*   GFR Estimated Creatinine Clearance: 4.9 mL/min (A) (by C-G formula based on SCr of 9.28 mg/dL (H)). Liver Function Tests: Recent Labs  Lab 12/03/21 0759  ALBUMIN 2.8*   No results for input(s): "LIPASE", "AMYLASE" in the last 168 hours. No results for input(s): "AMMONIA" in the last 168 hours. Coagulation profile Recent Labs  Lab 11/30/21 0941  INR 1.3*    CBC: Recent Labs  Lab 11/30/21 0941 12/01/21 0814 12/02/21 0420 12/03/21 0445 12/04/21 0638  WBC 10.8* 6.6 6.1 5.9  --   HGB 10.7* 8.9* 8.0* 8.2* 8.3*  HCT 33.6* 27.4* 24.7* 25.1* 25.8*  MCV 86.6 85.4 85.2 85.4  --  PLT 219 154 150 150  --    Cardiac Enzymes: No results for input(s): "CKTOTAL", "CKMB", "CKMBINDEX", "TROPONINI" in the last 168 hours. BNP (last 3 results) No results for input(s): "PROBNP" in the last 8760 hours. CBG: Recent Labs  Lab 11/30/21 2253  GLUCAP 80   D-Dimer: No results for input(s): "DDIMER" in the last 72 hours. Hgb A1c: No results for input(s): "HGBA1C" in the last 72 hours. Lipid Profile: No results for input(s): "CHOL", "HDL", "LDLCALC", "TRIG", "CHOLHDL", "LDLDIRECT" in the last 72 hours. Thyroid function studies: No results for input(s): "TSH", "T4TOTAL", "T3FREE", "THYROIDAB" in the last 72 hours.  Invalid input(s): "FREET3" Anemia work up: No results for input(s): "VITAMINB12", "FOLATE", "FERRITIN", "TIBC", "IRON", "RETICCTPCT" in the last 72 hours. Sepsis Labs: Recent Labs  Lab 11/30/21 0941 12/01/21 0814 12/02/21 0420 12/03/21 0445  WBC 10.8* 6.6 6.1 5.9    Microbiology No results found for this or any previous visit (from the past 240 hour(s)).  Procedures and diagnostic studies:  No results found.             LOS: 3 days   Camellia Popescu  [facility]   Pager on [email or web address]. If 7PM-7AM, please contact night-coverage at [email or web address]     12/04/2021, 3:13 PM

## 2021-12-04 NOTE — Progress Notes (Signed)
[facility]  ROUNDING NOTE   Subjective:   Teresa Cook is a 66 year old African-American female with past medical conditions including hypertension, gout, anxiety with depression, sickle cell trait, systolic heart failure with a EF 40 to 45%, breast cancer with bilateral mastectomy, seizures, and end-stage renal disease on hemodialysis.  Patient presents to the emergency department via EMS for multiple syncopal episodes.  Patient has been admitted for Acute deep vein thrombosis (DVT) of proximal vein of right lower extremity (HCC) [I82.4Y1] Deep venous thrombosis (DVT) of right peroneal vein ([place]) [I82.451]  Patient is known to our practice and receives outpatient dialysis treatments at [facility] on a MWF schedule, supervised by Dr. Candiss Norse.    Patient resting quietly, no new complaints.  Continues to have hematuria Room air No lower extremity edema  Objective:  Vital signs in last 24 hours:  Temp:  [97.7 F (36.5 C)-98.4 F (36.9 C)] 98.2 F (36.8 C) (11/28 0826) Pulse Rate:  [70-81] 73 (11/28 0826) Resp:  [16-17] 16 (11/28 0826) BP: (90-122)/(58-77) 109/72 (11/28 0826) SpO2:  [96 %-100 %] 96 % (11/28 0826)  Weight change:  Filed Weights   11/30/21 1531 12/03/21 0752 12/03/21 1150  Weight: 55 kg 53.5 kg 52.7 kg     Intake/Output: I/O last 3 completed shifts: In: 150.5 [I.V.:150.5] Out: 1000 [Other:1000]   Intake/Output this shift:  Total I/O In: 120 [P.O.:120] Out: 0   Physical Exam: General: NAD  Head: Normocephalic, atraumatic. Moist oral mucosal membranes  Eyes: Anicteric  Lungs:  Clear to auscultation, normal effort, room air  Heart: Regular rate and rhythm  Abdomen:  Soft, tender, BS present  Extremities:  No peripheral edema.  Neurologic: Nonfocal, moving all four extremities  Skin: No lesions  Access: Lt Permcath    Basic Metabolic Panel: Recent Labs  Lab 11/30/21 0941 12/01/21 0814 12/03/21 0759  NA 143 140 138  K  3.5 3.8 3.9  CL 106 102 102  CO2 '23 25 25  '$ GLUCOSE 100* 81 91  BUN 30* 20 38*  CREATININE 8.35* 5.79* 9.28*  CALCIUM 10.7* 10.1 9.8  MG 2.2  --   --   PHOS  --   --  5.0*     Liver Function Tests: Recent Labs  Lab 12/03/21 0759  ALBUMIN 2.8*    No results for input(s): "LIPASE", "AMYLASE" in the last 168 hours. No results for input(s): "AMMONIA" in the last 168 hours.  CBC: Recent Labs  Lab 11/30/21 0941 12/01/21 0814 12/02/21 0420 12/03/21 0445 12/04/21 0638  WBC 10.8* 6.6 6.1 5.9  --   HGB 10.7* 8.9* 8.0* 8.2* 8.3*  HCT 33.6* 27.4* 24.7* 25.1* 25.8*  MCV 86.6 85.4 85.2 85.4  --   PLT 219 154 150 150  --      Cardiac Enzymes: No results for input(s): "CKTOTAL", "CKMB", "CKMBINDEX", "TROPONINI" in the last 168 hours.  BNP: Invalid input(s): "POCBNP"  CBG: Recent Labs  Lab 11/30/21 2253  GLUCAP 42      Microbiology: Results for orders placed or performed during the hospital encounter of 11/05/21  Blood Culture (routine x 2)     Status: None   Collection Time: 11/05/21  3:20 AM   Specimen: BLOOD  Result Value Ref Range Status   Specimen Description BLOOD RIGHT FA  Final   Special Requests   Final    BOTTLES DRAWN AEROBIC AND ANAEROBIC Blood Culture results may not be optimal due to an inadequate volume of blood received in culture bottles  Culture   Final    NO GROWTH 5 DAYS Performed at [facility], [address] [place], [place] [address]    Report Status 11/10/2021 FINAL  Final  SARS Coronavirus 2 by RT PCR (hospital order, performed in [facility] hospital lab) *cepheid single result test* Anterior Nasal Swab     Status: None   Collection Time: 11/05/21  3:20 AM   Specimen: Anterior Nasal Swab  Result Value Ref Range Status   SARS Coronavirus 2 by RT PCR NEGATIVE NEGATIVE Final    Comment: (NOTE) SARS-CoV-2 target nucleic acids are NOT DETECTED.  The SARS-CoV-2 RNA is generally detectable in upper and lower respiratory  specimens during the acute phase of infection. The lowest concentration of SARS-CoV-2 viral copies this assay can detect is 250 copies / mL. A negative result does not preclude SARS-CoV-2 infection and should not be used as the sole basis for treatment or other patient management decisions.  A negative result may occur with improper specimen collection / handling, submission of specimen other than nasopharyngeal swab, presence of viral mutation(s) within the areas targeted by this assay, and inadequate number of viral copies (<250 copies / mL). A negative result must be combined with clinical observations, patient history, and epidemiological information.  Fact Sheet for Patients:   https://www.patel.info/  Fact Sheet for Healthcare Providers: https://hall.com/  This test is not yet approved or  cleared by the [place] FDA and has been authorized for detection and/or diagnosis of SARS-CoV-2 by FDA under an Emergency Use Authorization (EUA).  This EUA will remain in effect (meaning this test can be used) for the duration of the COVID-19 declaration under Section 564(b)(1) of the Act, 21 U.S.C. section 360bbb-3(b)(1), unless the authorization is terminated or revoked sooner.  Performed at [facility], [address] [place], [place] [address]   Blood Culture (routine x 2)     Status: None   Collection Time: 11/05/21  3:21 AM   Specimen: BLOOD  Result Value Ref Range Status   Specimen Description BLOOD RIGHT HAND  Final   Special Requests   Final    BOTTLES DRAWN AEROBIC AND ANAEROBIC Blood Culture results may not be optimal due to an inadequate volume of blood received in culture bottles   Culture   Final    NO GROWTH 5 DAYS Performed at [facility], [address], [place], [place] [address]    Report Status 11/10/2021 FINAL  Final  MRSA Next Gen by PCR, Nasal     Status: None   Collection Time: 11/05/21   6:57 AM   Specimen: Nasal Mucosa; Nasal Swab  Result Value Ref Range Status   MRSA by PCR Next Gen NOT DETECTED NOT DETECTED Final    Comment: (NOTE) The GeneXpert MRSA Assay (FDA approved for NASAL specimens only), is one component of a comprehensive MRSA colonization surveillance program. It is not intended to diagnose MRSA infection nor to guide or monitor treatment for MRSA infections. Test performance is not FDA approved in patients less than 56 years old. Performed at [facility], [address] [place], [place] [address]     Coagulation Studies: No results for input(s): "LABPROT", "INR" in the last 72 hours.   Urinalysis: No results for input(s): "COLORURINE", "LABSPEC", "PHURINE", "GLUCOSEU", "HGBUR", "BILIRUBINUR", "KETONESUR", "PROTEINUR", "UROBILINOGEN", "NITRITE", "LEUKOCYTESUR" in the last 72 hours.  Invalid input(s): "APPERANCEUR"     Imaging: No results found.   Medications:    sodium chloride 10 mL/hr at  12/03/21 0750    amiodarone  200 mg Oral BID   apixaban  2.5 mg Oral BID   carvedilol  12.5 mg Oral BID   Chlorhexidine Gluconate Cloth  6 each Topical Q0600   multivitamin  1 tablet Oral QHS   pantoprazole  40 mg Oral BID   sacubitril-valsartan  1 tablet Oral BID   sertraline  100 mg Oral Daily   sevelamer carbonate  2,400 mg Oral TID with meals   sodium chloride flush  3 mL Intravenous Q12H   traZODone  50 mg Oral QHS   zolpidem  5 mg Oral QHS   sodium chloride, acetaminophen **OR** acetaminophen, ALPRAZolam, ondansetron **OR** ondansetron (ZOFRAN) IV, oxyCODONE, polyethylene glycol, sodium chloride flush  Assessment/ Plan:  [name] is a 66 y.o.  female is a 66 year old African-American female with past medical conditions including hypertension, gout, anxiety with depression, sickle cell trait, systolic heart failure with a EF 40 to 45%, breast cancer with bilateral mastectomy, seizures, and end-stage renal disease on  hemodialysis.  Patient presents to the emergency department via EMS for multiple syncopal episodes.  Patient has been admitted for Acute deep vein thrombosis (DVT) of proximal vein of right lower extremity (HCC) [I82.4Y1] Deep venous thrombosis (DVT) of right peroneal vein (HCC) [I82.451]  [facility]/MWF/Lt Permcath  End-stage renal disease on hemodialysis.   Patient is on Monday Wednesday Friday schedule. Dialysis received yesterday, UF 1L achieved. Next treatment scheduled for Wednesday.    2. Anemia of chronic kidney disease  Lab Results  Component Value Date   HGB 8.3 (L) 12/04/2021  During last admission patient had severe hemorrhage in the right kidney requiring coil embolization.  Hgb slowly increasing. Continues to have hematuria. Urology consulted. [facility] outpatient.    3. Secondary Hyperparathyroidism: with outpatient labs: PTH 379, phosphorus 5.7, calcium 9.4 on June 18, 2021.   Lab Results  Component Value Date   PTH 214 (H) 07/08/2021   CALCIUM 9.8 12/03/2021   PHOS 5.0 (H) 12/03/2021   Continue sevelamer with all meals.  4.  Hypertension With chronic kidney disease Currently receiving amiodarone, carvedilol, hydralazine, and Entresto.  Blood pressure stable  5.  .Atrial fibrillation now admitted with DVT Patient has history of atrial fibrillation, and now is being admitted with DVT Patiently currently prescribed apixaban, vascular suggesting decreased dose.       LOS: 3 Teresa Cook 11/28/20233:11 PM

## 2021-12-04 NOTE — TOC Progression Note (Signed)
Transition of Care [facility]) - Progression Note    Patient Details  Name: Teresa Cook MRN: [id number] Date of Birth: 1955-03-05  Transition of Care [facility]) CM/SW Sa[place], RN Phone Number: 12/04/2021, 10:01 AM  Clinical Narrative:      Transition of Care (TOC) Screening Note   Patient Details  Name: Teresa Cook Date of Birth: Sep 12, 1955   Transition of Care [facility]) CM/SW Contact:    Conception Oms, RN Phone Number: 12/04/2021, 10:01 AM    Transition of Care Department [facility]) has reviewed patient and no TOC needs have been identified at this time. We will continue to monitor patient advancement through interdisciplinary progression rounds. If new patient transition needs arise, please place a TOC consult.         Expected Discharge Plan and Services                                                 Social Determinants of Health (SDOH) Interventions    Readmission Risk Interventions    11/06/2021   12:18 PM 07/30/2021    2:09 PM  Readmission Risk Prevention Plan  Transportation Screening Complete Complete  Medication Review ([place]) Complete Complete  PCP or Specialist appointment within 3-5 days of discharge Complete Complete  HRI or Home Care Consult Patient refused Complete  SW Recovery Care/Counseling Consult Not Complete   SW Consult Not Complete Comments NA   Palliative Care Screening Not Applicable Not [place] Not Applicable Not Applicable

## 2021-12-04 NOTE — Progress Notes (Signed)
The patient drives, Her Daughter is her HCPOA, She goes to [facility] MWF for dialysis

## 2021-12-04 NOTE — Progress Notes (Signed)
Teresa Cook is a 66 y.o. female, who presented to the emergency department yesterday from dialysis because she was unable to receive treatment because her catheter had clotted.  She was also complaining of pain in her right thigh and calf which led to a venous ultrasound that showed occlusive thrombus in the right greater saphenous vein and peroneal vein.   The patient was recently discharged from the hospital secondary to a retroperitoneal bleed in the setting of supratherapeutic INR.  She had coil embolization of a right renal artery.  She had been on anticoagulation for venous thromboembolic disease as well as atrial fibrillation.  She was not discharged on anticoagulation.  She does have a significant family history of clotting disorders.  She was able to dialyze successfully yesterday.   The patient has a history of breast cancer and is status post bilateral mastectomy.  She has a seizure disorder.  She is end-stage renal disease.  She is medically managed for hypertension.  She is a non-smoker.  She has a history of congestive heart failure on Entresto.  Subsequently following reinitiation of Eliquis therapy the patient began noticing urethral bleeding.  She specifically notes that it has not coming from her bowels.  The patient's history of venous thromboembolic disease as well as atrial fibrillation, suggest the patient is hypercoagulable.With her likely hypercoagulation, continuing with at least eliquis 2.5 mg BID would be ideal.  I also recommended evaluating for the cause of bleeding to determine if this may be episodic or something that may truly necessitate holding her Eliquis.  Given that it is felt to be urethral bleeding I recommend evaluation by neurology with possible evaluation by OB/GYN if there is no source found by urology.  If her hgb remains stable, the lower dose of eliquis may be appropriate. We worry that given her hypercoaguable state she may thrombose that IVC filter if we  put it in. If her Hgb remains stable we can proceed with just the lower dose of eliquis and no filter.  We will re evaluate pending evaluation of source of bleeding

## 2021-12-04 NOTE — H&P (View-Only) (Signed)
Urology Consult  Requesting physician: Neurology Jennye Boroughs, MD  Reason for consultation: Hematuria/urethral bleeding  Chief Complaint: Urethral bleeding  History of Present Illness: Telitha Plath is a 66 y.o. female with a history of end-stage renal disease on hemodialysis.  She was hospitalized November 2023 for a large retroperitoneal bleed secondary to renal artery hemorrhage and underwent coil embolization of the right renal artery she was initially admitted for hemodialysis due to problems with dialysis as an outpatient.  Was found to have RLE DVT.  Started on heparin drip and recently switched to Eliquis.  Has been on hemodialysis for several years and has been essentially  anuric.  After her recent hospitalization she began to note blood per urethra and has passed blood per urethra.  Denies flank, abdominal or pelvic pain.  She has not had any follow-up CT imaging since her last hospitalization and since her bleeding started  Past Medical History:  Diagnosis Date   Anemia    Anxiety    Breast cancer ([place]) 01/2016   bilateral   CHF (congestive heart failure) ([place])    Chronic kidney disease    Depression    Dialysis patient [facility])    DVT (deep venous thrombosis) ([place])    left leg   DVT (deep venous thrombosis) ([place]) 1985   right thigh   Dysrhythmia    Gout    Headache    HTN (hypertension)    Hypertension    Parathyroid abnormality (HCC)    Parathyroid disease ([place])    Pneumonia 12/2015   Psoriasis    Renal insufficiency    Sickle cell trait ([place])    traits    Past Surgical History:  Procedure Laterality Date   ABDOMINAL HYSTERECTOMY  1980   APPENDECTOMY     BREAST BIOPSY Left 10/28/2013   benign   BREAST EXCISIONAL BIOPSY Left 2002   benign   CORONARY ANGIOGRAPHY N/A 04/04/2020   Procedure: CORONARY ANGIOGRAPHY;  Surgeon: Wellington Hampshire, MD;  Location: [place] [facility];  Service: Cardiovascular;  Laterality: N/A;   INSERTION OF DIALYSIS  CATHETER  2014   LIPOMA EXCISION N/A 01/23/2016   Procedure: EXCISION LIPOMA;  Surgeon: Hubbard Robinson, MD;  Location: [facility] OR[facility];  Service: General;  Laterality: N/A;   MASTECTOMY W/ SENTINEL NODE BIOPSY Bilateral 01/23/2016   Procedure: bilateral MASTECTOMY WITH  bilateral SENTINEL LYMPH NODE BIOPSY possible left axillary node dissection forehead lipoma removal;  Surgeon: Hubbard Robinson, MD;  Location: [facility] ORS;  Service: General;  Laterality: Bilateral;   PARTIAL HYSTERECTOMY     PERIPHERAL VASCULAR CATHETERIZATION N/A 12/25/2015   Procedure: Dialysis/Perma Catheter Insertion;  Surgeon: Algernon Huxley, MD;  Location: [place] [facility];  Service: Cardiovascular;  Laterality: N/A;   PERIPHERAL VASCULAR CATHETERIZATION Left 01/22/2016   Procedure: Dialysis/Perma Catheter Insertion;  Surgeon: Algernon Huxley, MD;  Location: [place] [facility];  Service: Cardiovascular;  Laterality: Left;   PERIPHERAL VASCULAR CATHETERIZATION N/A 01/26/2016   Procedure: Dialysis/Perma Catheter Insertion;  Surgeon: Katha Cabal, MD;  Location: [place] [facility];  Service: Cardiovascular;  Laterality: N/A;   PORT-A-CATH REMOVAL N/A 12/20/2015   Procedure: REMOVAL PORT-A-CATH;  Surgeon: Hubbard Robinson, MD;  Location: [facility] OR[facility];  Service: General;  Laterality: N/A;  left     PORTACATH PLACEMENT Left 08/21/2015   Procedure: INSERTION PORT-A-CATH;  Surgeon: Hubbard Robinson, MD;  Location: [facility];  Service: General;  Laterality: Left;   REMOVAL OF A DIALYSIS CATHETER  2017  RENAL ANGIOGRAPHY Right 11/05/2021   Procedure: RENAL ANGIOGRAPHY;  Surgeon: Algernon Huxley, MD;  Location: [place] [facility];  Service: Cardiovascular;  Laterality: Right;  with embolization   RIGHT HEART CATH N/A 04/04/2020   Procedure: RIGHT HEART CATH;  Surgeon: Wellington Hampshire, MD;  Location: [place] [facility];  Service: Cardiovascular;  Laterality: N/A;    Home Medications:  Current Meds  Medication Sig   amiodarone  (PACERONE) 200 MG tablet Take 1 tablet (200 mg total) by mouth 2 (two) times daily.   b complex-vitamin c-folic acid (NEPHRO-VITE) 0.8 MG TABS tablet Take 1 tablet by mouth daily.   carvedilol (COREG) 12.5 MG tablet Take 1 tablet (12.5 mg total) by mouth 2 (two) times daily.   hydrALAZINE (APRESOLINE) 25 MG tablet Take 1 tablet (25 mg total) by mouth 3 (three) times daily.   pantoprazole (PROTONIX) 40 MG tablet Take 1 tablet (40 mg total) by mouth 2 (two) times daily.   sacubitril-valsartan (ENTRESTO) 24-26 MG Take 1 tablet by mouth 2 (two) times daily.   sertraline (ZOLOFT) 100 MG tablet Take 100 mg by mouth daily.   zolpidem (AMBIEN) 10 MG tablet Take 0.5 tablets (5 mg total) by mouth at bedtime.    Allergies:  Allergies  Allergen Reactions   Gabapentin Other (See Comments)    Seizure   Adhesive [Tape] Itching    Silk tape is ok to use.    Family History  Problem Relation Age of Onset   Stroke Mother    CVA Mother    Hypertension Mother    Hypertension Father    Hypertension Sister    Diabetes Sister    Cancer Sister 27       Breast   Stroke Brother    Hypertension Brother    Breast cancer Maternal Aunt 70    Social History:  reports that she has never smoked. She has never used smokeless tobacco. She reports that she does not drink alcohol and does not use drugs.  ROS: A complete review of systems was performed.  All systems are negative except for pertinent findings as noted.  Physical Exam:  Vital signs in last 24 hours: Temp:  [98.2 F (36.8 C)-98.4 F (36.9 C)] 98.4 F (36.9 C) (11/28 1633) Pulse Rate:  [70-81] 71 (11/28 1633) Resp:  [16-17] 16 (11/28 1633) BP: (90-125)/(58-76) 125/76 (11/28 1633) SpO2:  [96 %-99 %] 99 % (11/28 1633) Constitutional:  Alert, No acute distress HEENT: [place] AT Cardiovascular: Regular rate and rhythm. Respiratory: Normal respiratory effort, lungs clear bilaterally GI: Abdomen is soft, nontender, nondistended, no abdominal  masses Skin: No rashes, bruises or suspicious lesions Lymph: No cervical or inguinal adenopathy Neurologic: Grossly intact, no focal deficits, moving all 4 extremities Psychiatric: Normal mood and affect   Laboratory Data:  Recent Labs    12/02/21 0420 12/03/21 0445 12/04/21 0638  WBC 6.1 5.9  --   HGB 8.0* 8.2* 8.3*  HCT 24.7* 25.1* 25.8*   Recent Labs    12/03/21 0759  NA 138  K 3.9  CL 102  CO2 25  GLUCOSE 91  BUN 38*  CREATININE 9.28*  CALCIUM 9.8     Impression/Assessment:  66 y.o. female with a 10-day history of blood per urethra.  She has anuric on hemodialysis  Recommendation:  CT abdomen pelvis to assess for any possibility of upper tract etiology and degree of blood/clot Cystoscopy with possible bladder biopsy/TURBT tentatively scheduled for Thursday, 12/06/2021   12/04/2021, 8:14 PM  John Giovanni,  MD

## 2021-12-04 NOTE — Plan of Care (Signed)

## 2021-12-04 NOTE — Discharge Planning (Signed)
ESTABLISHED HEMODIALYSIS PATIENT Outpatient facility:  [facility]  [address], [place] [address] [phone]   Treatment days: Monday, Wednesday and Friday   Chair time: 07:00am  Schedule confirmed with Elta Guadeloupe RN.  Met with patient, stated she drives herself to treatments and has no dialysis concerns at this time.

## 2021-12-05 ENCOUNTER — Inpatient Hospital Stay: Payer: Medicare Other

## 2021-12-05 DIAGNOSIS — I10 Essential (primary) hypertension: Secondary | ICD-10-CM | POA: Diagnosis not present

## 2021-12-05 DIAGNOSIS — N186 End stage renal disease: Secondary | ICD-10-CM | POA: Diagnosis not present

## 2021-12-05 DIAGNOSIS — I480 Paroxysmal atrial fibrillation: Secondary | ICD-10-CM

## 2021-12-05 DIAGNOSIS — I82451 Acute embolism and thrombosis of right peroneal vein: Secondary | ICD-10-CM | POA: Diagnosis not present

## 2021-12-05 DIAGNOSIS — F331 Major depressive disorder, recurrent, moderate: Secondary | ICD-10-CM

## 2021-12-05 LAB — CBC WITH DIFFERENTIAL/PLATELET
Abs Immature Granulocytes: 0.07 10*3/uL (ref 0.00–0.07)
Basophils Absolute: 0 10*3/uL (ref 0.0–0.1)
Basophils Relative: 0 %
Eosinophils Absolute: 0.1 10*3/uL (ref 0.0–0.5)
Eosinophils Relative: 2 %
HCT: 27.1 % — ABNORMAL LOW (ref 36.0–46.0)
Hemoglobin: 8.6 g/dL — ABNORMAL LOW (ref 12.0–15.0)
Immature Granulocytes: 1 %
Lymphocytes Relative: 20 %
Lymphs Abs: 1.3 10*3/uL (ref 0.7–4.0)
MCH: 27.1 pg (ref 26.0–34.0)
MCHC: 31.7 g/dL (ref 30.0–36.0)
MCV: 85.5 fL (ref 80.0–100.0)
Monocytes Absolute: 0.7 10*3/uL (ref 0.1–1.0)
Monocytes Relative: 12 %
Neutro Abs: 4 10*3/uL (ref 1.7–7.7)
Neutrophils Relative %: 65 %
Platelets: 145 10*3/uL — ABNORMAL LOW (ref 150–400)
RBC: 3.17 MIL/uL — ABNORMAL LOW (ref 3.87–5.11)
RDW: 18.8 % — ABNORMAL HIGH (ref 11.5–15.5)
WBC: 6.3 10*3/uL (ref 4.0–10.5)
nRBC: 0 % (ref 0.0–0.2)

## 2021-12-05 LAB — RENAL FUNCTION PANEL
Albumin: 2.7 g/dL — ABNORMAL LOW (ref 3.5–5.0)
Anion gap: 12 (ref 5–15)
BUN: 36 mg/dL — ABNORMAL HIGH (ref 8–23)
CO2: 27 mmol/L (ref 22–32)
Calcium: 10.2 mg/dL (ref 8.9–10.3)
Chloride: 97 mmol/L — ABNORMAL LOW (ref 98–111)
Creatinine, Ser: 8.35 mg/dL — ABNORMAL HIGH (ref 0.44–1.00)
GFR, Estimated: 5 mL/min — ABNORMAL LOW (ref >60)
Glucose, Bld: 95 mg/dL (ref 70–99)
Phosphorus: 4.7 mg/dL — ABNORMAL HIGH (ref 2.5–4.6)
Potassium: 4.1 mmol/L (ref 3.5–5.1)
Sodium: 136 mmol/L (ref 135–145)

## 2021-12-05 LAB — HEMOGLOBIN AND HEMATOCRIT, BLOOD
HCT: 26.3 % — ABNORMAL LOW (ref 36.0–46.0)
Hemoglobin: 8.6 g/dL — ABNORMAL LOW (ref 12.0–15.0)

## 2021-12-05 MED ORDER — CARVEDILOL 3.125 MG PO TABS: 6.2500 mg | ORAL_TABLET | Freq: Two times a day (BID) | ORAL | Status: DC

## 2021-12-05 MED ORDER — HEPARIN SODIUM (PORCINE) 1000 UNIT/ML IJ SOLN
INTRAMUSCULAR | Status: AC
  Filled 2021-12-05: qty 10

## 2021-12-05 MED ORDER — SODIUM CHLORIDE 0.9 % IV SOLN
1.0000 g | INTRAVENOUS | Status: AC
  Administered 2021-12-06: 1 g via INTRAVENOUS
  Filled 2021-12-05: qty 1
  Filled 2021-12-05: qty 10

## 2021-12-05 NOTE — Plan of Care (Signed)

## 2021-12-05 NOTE — Plan of Care (Signed)
  Problem: Clinical Measurements: Goal: Ability to maintain clinical measurements within normal limits will improve Outcome: Progressing   Problem: Clinical Measurements: Goal: Respiratory complications will improve Outcome: Progressing   Problem: Elimination: Goal: Will not experience complications related to bowel motility Outcome: Progressing   Problem: Skin Integrity: Goal: Risk for impaired skin integrity will decrease Outcome: Progressing   Problem: Safety: Goal: Ability to remain free from injury will improve Outcome: Progressing

## 2021-12-05 NOTE — Hospital Course (Addendum)
Teresa Cook is a 66 y.o. female with medical history significant for breast cancer status post bilateral mastectomy, seizure disorder, chronic diastolic dysfunction CHF, hypertension, anxiety with depression, end-stage renal disease on hemodialysis (M/W/F), recent discharge from the hospital (11/22/2021) after hospitalization for retroperitoneal bleed following a renal artery hemorrhage in the setting of supratherapeutic INR, s/p coil embolization of right renal artery.  Eliquis had been held at discharge because of anemia and hematuria.   11/24: She presented to the emergency room from dialysis center because she was unable to get her scheduled renal replacement therapy.  She said the machine kept clotting off so she was referred to the emergency room for dialysis.  She complained of chest tightness, shortness of breath, pain in her right thigh and right calf. She was found to have right lower extremity DVT (occlusive thrombus in the greater saphenous vein and peroneal vein and nonocclusive thrombus at the saphenofemoral junction).  She was treated with IV heparin drip. Underwent dialysis 11/25: vascular surgery saw patient - risks > benefits for surgical intervention, recs should be started back on anticoagulation. 11/26: IV heparin --> po Eliquis, noted urethral bleeding / hematuria  11/27: underwent dialysis. [facility]  11/28: vascular confirms plan for Eliquis 2.5 mg bid, concern for thrombosing IVC filter if this were to be placed. Urology saw pt - CT abd/pelvis ordered. Tentatively planning cystoscopy / ureteroscopy Thurs 11/30 11/29:   Consultants:  Urology  Vascular Surgery  Nephrology   Procedures: none      ASSESSMENT & PLAN:   Principal Problem:   ESRD on hemodialysis ([place]) Active Problems:   Deep venous thrombosis (DVT) of right peroneal vein (HCC)   Hypercalcemia   Hypertension   Moderate recurrent major depression (HCC)   PAF (paroxysmal atrial fibrillation) (HCC)    Chronic diastolic CHF (congestive heart failure) ([place])   Urethral bleeding   Right lower extremity DVT Vascular surgeon recommended decreasing Eliquis from 5 mg to 2.5 mg twice daily because she is not a good candidate for IVC filter as she may develop thrombosis of IVC filter because of hypercoagulability.     Urethral bleeding Consulted Dr. Bernardo Heater- planning cystoscopy this week based on OR availability 12/06/21 (tomorrow).   Hypotension BP has been running low.   Discontinue hydralazine.   Continue carvedilol at lowered dose Continue Entresto for now.   Antihypertensives may need to be adjusted depending on her blood pressure     ESRD on MWF schedule Plan for hemodialysis as usual.   Follow-up with nephrologist.   Hypercalcemia - resolved Monitor periodic BMP   Chronic diastolic CHF (congestive heart failure, hypertension Compensated.   Continue antihypertensives as above   PAF (paroxysmal atrial fibrillation) (HCC) Continue amiodarone and carvedilol   Moderate recurrent major depression (HCC) Continue sertraline and trazodone    Chronic anemia Overall, downward trending hemoglobin since admission but H&H is stable.   Monitor H&H on Eliquis.   Stage II decubitus ulcers on bilateral buttocks More prominent on the right buttock.  These were present on admission and now healing    DVT tx: Eliquis  Pertinent IV fluids/nutrition: none, plaqn NPO after midnight tonight anticipating cystoscopy/ureteroscopy tomorrow  Central lines / invasive devices: L IJ HD cath   Code Status: FULL CODE  Disposition: inpatient  TOC needs: none at this time  Barriers to discharge / significant pending items: surgery tomorrow, if stable / no significant findings on ureteroscopy / cystoscopy may be able to discharge if urology/nephrology have no additional concerns

## 2021-12-05 NOTE — Progress Notes (Signed)
[facility]  ROUNDING NOTE   Subjective:   Teresa Cook is a 66 year old African-American female with past medical conditions including hypertension, gout, anxiety with depression, sickle cell trait, systolic heart failure with a EF 40 to 45%, breast cancer with bilateral mastectomy, seizures, and end-stage renal disease on hemodialysis.  Patient presents to the emergency department via EMS for multiple syncopal episodes.  Patient has been admitted for Acute deep vein thrombosis (DVT) of proximal vein of right lower extremity (HCC) [I82.4Y1] Deep venous thrombosis (DVT) of right peroneal vein ([place]) [I82.451]  Patient is known to our practice and receives outpatient dialysis treatments at [facility] on a MWF schedule, supervised by Dr. Candiss Norse.    Patient seen and evaluated during dialysis   HEMODIALYSIS FLOWSHEET:  Blood Flow Rate (mL/min): 350 mL/min Arterial Pressure (mmHg): -160 mmHg Venous Pressure (mmHg): 240 mmHg TMP (mmHg): 13 mmHg Ultrafiltration Rate (mL/min): 618 mL/min Dialysate Flow Rate (mL/min): 300 ml/min Dialysis Fluid Bolus: Normal Saline  No complaints at this time  Objective:  Vital signs in last 24 hours:  Temp:  [98.3 F (36.8 C)-98.8 F (37.1 C)] 98.3 F (36.8 C) (11/29 1140) Pulse Rate:  [63-76] 74 (11/29 1147) Resp:  [11-23] 16 (11/29 1147) BP: (94-131)/(60-80) 116/72 (11/29 1147) SpO2:  [97 %-100 %] 100 % (11/29 1147) Weight:  [53.1 kg] 53.1 kg (11/29 0830)  Weight change:  Filed Weights   12/03/21 0752 12/03/21 1150 12/05/21 0830  Weight: 53.5 kg 52.7 kg 53.1 kg     Intake/Output: I/O last 3 completed shifts: In: 120 [P.O.:120] Out: 0    Intake/Output this shift:  Total I/O In: -  Out: 2000 [Other:2000]  Physical Exam: General: NAD  Head: Normocephalic, atraumatic. Moist oral mucosal membranes  Eyes: Anicteric  Lungs:  Clear to auscultation, normal effort, room air  Heart: Regular rate and rhythm   Abdomen:  Soft, tender, BS present  Extremities:  No peripheral edema.  Neurologic: Nonfocal, moving all four extremities  Skin: No lesions  Access: Lt Permcath    Basic Metabolic Panel: Recent Labs  Lab 11/30/21 0941 12/01/21 0814 12/03/21 0759 12/05/21 0905  NA 143 140 138 136  K 3.5 3.8 3.9 4.1  CL 106 102 102 97*  CO2 '23 25 25 27  '$ GLUCOSE 100* 81 91 95  BUN 30* 20 38* 36*  CREATININE 8.35* 5.79* 9.28* 8.35*  CALCIUM 10.7* 10.1 9.8 10.2  MG 2.2  --   --   --   PHOS  --   --  5.0* 4.7*     Liver Function Tests: Recent Labs  Lab 12/03/21 0759 12/05/21 0905  ALBUMIN 2.8* 2.7*    No results for input(s): "LIPASE", "AMYLASE" in the last 168 hours. No results for input(s): "AMMONIA" in the last 168 hours.  CBC: Recent Labs  Lab 11/30/21 0941 12/01/21 0814 12/02/21 0420 12/03/21 0445 12/04/21 0638 12/05/21 0315 12/05/21 0905  WBC 10.8* 6.6 6.1 5.9  --   --  6.3  NEUTROABS  --   --   --   --   --   --  4.0  HGB 10.7* 8.9* 8.0* 8.2* 8.3* 8.6* 8.6*  HCT 33.6* 27.4* 24.7* 25.1* 25.8* 26.3* 27.1*  MCV 86.6 85.4 85.2 85.4  --   --  85.5  PLT 219 154 150 150  --   --  145*     Cardiac Enzymes: No results for input(s): "CKTOTAL", "CKMB", "CKMBINDEX", "TROPONINI" in the last 168 hours.  BNP: Invalid input(s): "  POCBNP"  CBG: Recent Labs  Lab 11/30/21 2253  GLUCAP 55      Microbiology: Results for orders placed or performed during the hospital encounter of 11/05/21  Blood Culture (routine x 2)     Status: None   Collection Time: 11/05/21  3:20 AM   Specimen: BLOOD  Result Value Ref Range Status   Specimen Description BLOOD RIGHT FA  Final   Special Requests   Final    BOTTLES DRAWN AEROBIC AND ANAEROBIC Blood Culture results may not be optimal due to an inadequate volume of blood received in culture bottles   Culture   Final    NO GROWTH 5 DAYS Performed at [facility], [address] [place], [place] [address]    Report Status  11/10/2021 FINAL  Final  SARS Coronavirus 2 by RT PCR (hospital order, performed in [facility] hospital lab) *cepheid single result test* Anterior Nasal Swab     Status: None   Collection Time: 11/05/21  3:20 AM   Specimen: Anterior Nasal Swab  Result Value Ref Range Status   SARS Coronavirus 2 by RT PCR NEGATIVE NEGATIVE Final    Comment: (NOTE) SARS-CoV-2 target nucleic acids are NOT DETECTED.  The SARS-CoV-2 RNA is generally detectable in upper and lower respiratory specimens during the acute phase of infection. The lowest concentration of SARS-CoV-2 viral copies this assay can detect is 250 copies / mL. A negative result does not preclude SARS-CoV-2 infection and should not be used as the sole basis for treatment or other patient management decisions.  A negative result may occur with improper specimen collection / handling, submission of specimen other than nasopharyngeal swab, presence of viral mutation(s) within the areas targeted by this assay, and inadequate number of viral copies (<250 copies / mL). A negative result must be combined with clinical observations, patient history, and epidemiological information.  Fact Sheet for Patients:   https://www.patel.info/  Fact Sheet for Healthcare Providers: https://hall.com/  This test is not yet approved or  cleared by the [place] FDA and has been authorized for detection and/or diagnosis of SARS-CoV-2 by FDA under an Emergency Use Authorization (EUA).  This EUA will remain in effect (meaning this test can be used) for the duration of the COVID-19 declaration under Section 564(b)(1) of the Act, 21 U.S.C. section 360bbb-3(b)(1), unless the authorization is terminated or revoked sooner.  Performed at [facility], [address] [place], [place] [address]   Blood Culture (routine x 2)     Status: None   Collection Time: 11/05/21  3:21 AM   Specimen: BLOOD  Result  Value Ref Range Status   Specimen Description BLOOD RIGHT HAND  Final   Special Requests   Final    BOTTLES DRAWN AEROBIC AND ANAEROBIC Blood Culture results may not be optimal due to an inadequate volume of blood received in culture bottles   Culture   Final    NO GROWTH 5 DAYS Performed at [facility], [address] [place],  [address]    Report Status 11/10/2021 FINAL  Final  MRSA Next Gen by PCR, Nasal     Status: None   Collection Time: 11/05/21  6:57 AM   Specimen: Nasal Mucosa; Nasal Swab  Result Value Ref Range Status   MRSA by PCR Next Gen NOT DETECTED NOT DETECTED Final    Comment: (NOTE) The GeneXpert MRSA Assay (FDA approved for NASAL specimens only), is one component of a comprehensive MRSA colonization surveillance program. It  is not intended to diagnose MRSA infection nor to guide or monitor treatment for MRSA infections. Test performance is not FDA approved in patients less than 40 years old. Performed at [facility], [address] [place], [place] [address]     Coagulation Studies: No results for input(s): "LABPROT", "INR" in the last 72 hours.   Urinalysis: No results for input(s): "COLORURINE", "LABSPEC", "PHURINE", "GLUCOSEU", "HGBUR", "BILIRUBINUR", "KETONESUR", "PROTEINUR", "UROBILINOGEN", "NITRITE", "LEUKOCYTESUR" in the last 72 hours.  Invalid input(s): "APPERANCEUR"     Imaging: No results found.   Medications:    sodium chloride 10 mL/hr at 12/03/21 0750    amiodarone  200 mg Oral BID   apixaban  2.5 mg Oral BID   carvedilol  12.5 mg Oral BID   Chlorhexidine Gluconate Cloth  6 each Topical Q0600   heparin sodium (porcine)       multivitamin  1 tablet Oral QHS   pantoprazole  40 mg Oral BID   sacubitril-valsartan  1 tablet Oral BID   sertraline  100 mg Oral Daily   sevelamer carbonate  2,400 mg Oral TID with meals   sodium chloride flush  3 mL Intravenous Q12H   traZODone  50 mg Oral QHS   zolpidem  5 mg  Oral QHS   sodium chloride, acetaminophen **OR** acetaminophen, ALPRAZolam, heparin sodium (porcine), ondansetron **OR** ondansetron (ZOFRAN) IV, oxyCODONE, polyethylene glycol, sodium chloride flush  Assessment/ Plan:  Ms. Emilija Bohman is a 66 y.o.  female is a 67 year old African-American female with past medical conditions including hypertension, gout, anxiety with depression, sickle cell trait, systolic heart failure with a EF 40 to 45%, breast cancer with bilateral mastectomy, seizures, and end-stage renal disease on hemodialysis.  Patient presents to the emergency department via EMS for multiple syncopal episodes.  Patient has been admitted for Acute deep vein thrombosis (DVT) of proximal vein of right lower extremity (HCC) [I82.4Y1] Deep venous thrombosis (DVT) of right peroneal vein (HCC) [I82.451]  [facility]/MWF/Lt Permcath  End-stage renal disease on hemodialysis.   Patient is on Monday Wednesday Friday schedule. Receiving dialysis today to maintain outpatient schedule, UF 1L as tolerated. Next treatment scheduled for Friday.    2. Anemia of chronic kidney disease  Lab Results  Component Value Date   HGB 8.6 (L) 12/05/2021  During last admission patient had severe hemorrhage in the right kidney requiring coil embolization.  Hemoglobin remains below desired target.  Patient does receive Mircera outpatient.  Hematuria continues.  Urology consulted and patient scheduled for cystoscopy with possible TURBT tomorrow.  3. Secondary Hyperparathyroidism: with outpatient labs: PTH 379, phosphorus 5.7, calcium 9.4 on June 18, 2021.   Lab Results  Component Value Date   PTH 214 (H) 07/08/2021   CALCIUM 10.2 12/05/2021   PHOS 4.7 (H) 12/05/2021  Bone mineral is within acceptable range. Continue sevelamer with all meals.  4.  Hypertension With chronic kidney disease Currently receiving amiodarone, carvedilol, hydralazine, and Entresto.  Blood pressure 116/72 during  dialysis.  5.  .Atrial fibrillation now admitted with DVT Patient has history of atrial fibrillation, and now is being admitted with DVT Patient prescribed apixaban, cardiology recommends dose adjusted to 2.5 mg twice daily yesterday.       LOS: [place] 11/29/202311:49 AM

## 2021-12-05 NOTE — Progress Notes (Signed)
Pt tolerated and completed tx without difficulty.  No distress noted and no c/o.  Pt ran for 2hrs 16mn and removed 1.0L fluid. B/p stable at 116/72.  Report given to floor nurse and left via transport.

## 2021-12-05 NOTE — Progress Notes (Signed)
PROGRESS NOTE    Teresa Cook   GBT:[id number] DOB: 1955/05/23  DOA: 11/30/2021 Date of Service: 12/05/21 PCP: [facility] [place]     Brief Narrative / Hospital Course:  Teresa Cook is a 66 y.o. female with medical history significant for breast cancer status post bilateral mastectomy, seizure disorder, chronic diastolic dysfunction CHF, hypertension, anxiety with depression, end-stage renal disease on hemodialysis (M/W/F), recent discharge from the hospital (11/22/2021) after hospitalization for retroperitoneal bleed following a renal artery hemorrhage in the setting of supratherapeutic INR, s/p coil embolization of right renal artery.  Eliquis had been held at discharge because of anemia and hematuria.   11/24: She presented to the emergency room from dialysis center because she was unable to get her scheduled renal replacement therapy.  She said the machine kept clotting off so she was referred to the emergency room for dialysis.  She complained of chest tightness, shortness of breath, pain in her right thigh and right calf. She was found to have right lower extremity DVT (occlusive thrombus in the greater saphenous vein and peroneal vein and nonocclusive thrombus at the saphenofemoral junction).  She was treated with IV heparin drip. Underwent dialysis 11/25: vascular surgery saw patient - risks > benefits for surgical intervention, recs should be started back on anticoagulation. 11/26: IV heparin --> po Eliquis, noted urethral bleeding / hematuria  11/27: underwent dialysis. [facility]  11/28: vascular confirms plan for Eliquis 2.5 mg bid, concern for thrombosing IVC filter if this were to be placed. Urology saw pt - CT abd/pelvis ordered. Tentatively planning cystoscopy / ureteroscopy Thurs 11/30 11/29:   Consultants:  Urology  Vascular Surgery  Nephrology   Procedures: none      ASSESSMENT & PLAN:   Principal Problem:   ESRD on hemodialysis  ([place]) Active Problems:   Deep venous thrombosis (DVT) of right peroneal vein (HCC)   Hypercalcemia   Hypertension   Moderate recurrent major depression (HCC)   PAF (paroxysmal atrial fibrillation) (HCC)   Chronic diastolic CHF (congestive heart failure) ([place])   Urethral bleeding   Right lower extremity DVT Vascular surgeon recommended decreasing Eliquis from 5 mg to 2.5 mg twice daily because she is not a good candidate for IVC filter as she may develop thrombosis of IVC filter because of hypercoagulability.     Urethral bleeding Consulted Dr. Bernardo Heater- planning cystoscopy this week based on OR availability 12/06/21 (tomorrow).   Hypotension BP has been running low.   Discontinue hydralazine.   Continue carvedilol at lowered dose Continue Entresto for now.   Antihypertensives may need to be adjusted depending on her blood pressure     ESRD on MWF schedule Plan for hemodialysis as usual.   Follow-up with nephrologist.   Hypercalcemia - resolved Monitor periodic BMP   Chronic diastolic CHF (congestive heart failure, hypertension Compensated.   Continue antihypertensives as above   PAF (paroxysmal atrial fibrillation) (HCC) Continue amiodarone and carvedilol   Moderate recurrent major depression (HCC) Continue sertraline and trazodone    Chronic anemia Overall, downward trending hemoglobin since admission but H&H is stable.   Monitor H&H on Eliquis.   Stage II decubitus ulcers on bilateral buttocks More prominent on the right buttock.  These were present on admission and now healing    DVT tx: Eliquis  Pertinent IV fluids/nutrition: none, plaqn NPO after midnight tonight anticipating cystoscopy/ureteroscopy tomorrow  Central lines / invasive devices: L IJ HD cath   Code Status: FULL CODE  Disposition: inpatient  TOC needs:  none at this time  Barriers to discharge / significant pending items: surgery tomorrow, if stable / no significant findings on ureteroscopy /  cystoscopy may be able to discharge if urology/nephrology have no additional concerns             Subjective:  Patient reports no concerns today  Denies CP/SOB.  Pain controlled.  Denies new weakness.  Tolerating diet.  Reports no concerns w/ urination/defecation.   Family Communication: none at this time     Objective Findings:  Vitals:   12/05/21 1130 12/05/21 1140 12/05/21 1147 12/05/21 1152  BP: 118/80 112/76 116/72   Pulse: 74 76 74   Resp: 11 (!) 23 16   Temp:  98.3 F (36.8 C)    TempSrc:  Oral    SpO2: 100% 100% 100%   Weight:    51.9 kg  Height:        Intake/Output Summary (Last 24 hours) at 12/05/2021 1306 Last data filed at 12/05/2021 1147 Gross per 24 hour  Intake 120 ml  Output 2000 ml  Net -1880 ml   Filed Weights   12/03/21 1150 12/05/21 0830 12/05/21 1152  Weight: 52.7 kg 53.1 kg 51.9 kg    Examination:  Constitutional:  VS as above General Appearance: alert, NAD Respiratory: Normal respiratory effort No wheeze No rhonchi No rales Cardiovascular: S1/S2 normal + murmur No rub/gallop auscultated No JVD No lower extremity edema Gastrointestinal: No tenderness Musculoskeletal:  No clubbing/cyanosis of digits Symmetrical movement in all extremities Neurological: No cranial nerve deficit on limited exam Alert Psychiatric: Normal judgment/insight Normal mood and affect       Scheduled Medications:   amiodarone  200 mg Oral BID   apixaban  2.5 mg Oral BID   carvedilol  6.25 mg Oral BID   Chlorhexidine Gluconate Cloth  6 each Topical Q0600   heparin sodium (porcine)       multivitamin  1 tablet Oral QHS   pantoprazole  40 mg Oral BID   sacubitril-valsartan  1 tablet Oral BID   sertraline  100 mg Oral Daily   sevelamer carbonate  2,400 mg Oral TID with meals   sodium chloride flush  3 mL Intravenous Q12H   traZODone  50 mg Oral QHS   zolpidem  5 mg Oral QHS    Continuous Infusions:  sodium chloride 10 mL/hr at  12/03/21 0750    PRN Medications:  sodium chloride, acetaminophen **OR** acetaminophen, ALPRAZolam, heparin sodium (porcine), ondansetron **OR** ondansetron (ZOFRAN) IV, oxyCODONE, polyethylene glycol, sodium chloride flush  Antimicrobials:  Anti-infectives (From admission, onward)    None           Data Reviewed: I have personally reviewed following labs and imaging studies  CBC: Recent Labs  Lab 11/30/21 0941 12/01/21 0814 12/02/21 0420 12/03/21 0445 12/04/21 0638 12/05/21 0315 12/05/21 0905  WBC 10.8* 6.6 6.1 5.9  --   --  6.3  NEUTROABS  --   --   --   --   --   --  4.0  HGB 10.7* 8.9* 8.0* 8.2* 8.3* 8.6* 8.6*  HCT 33.6* 27.4* 24.7* 25.1* 25.8* 26.3* 27.1*  MCV 86.6 85.4 85.2 85.4  --   --  85.5  PLT 219 154 150 150  --   --  782*   Basic Metabolic Panel: Recent Labs  Lab 11/30/21 0941 12/01/21 0814 12/03/21 0759 12/05/21 0905  NA 143 140 138 136  K 3.5 3.8 3.9 4.1  CL 106 102 102 97*  CO2 '23 25 25 27  '$ GLUCOSE 100* 81 91 95  BUN 30* 20 38* 36*  CREATININE 8.35* 5.79* 9.28* 8.35*  CALCIUM 10.7* 10.1 9.8 10.2  MG 2.2  --   --   --   PHOS  --   --  5.0* 4.7*   GFR: Estimated Creatinine Clearance: 5.4 mL/min (A) (by C-G formula based on SCr of 8.35 mg/dL (H)). Liver Function Tests: Recent Labs  Lab 12/03/21 0759 12/05/21 0905  ALBUMIN 2.8* 2.7*   No results for input(s): "LIPASE", "AMYLASE" in the last 168 hours. No results for input(s): "AMMONIA" in the last 168 hours. Coagulation Profile: Recent Labs  Lab 11/30/21 0941  INR 1.3*   Cardiac Enzymes: No results for input(s): "CKTOTAL", "CKMB", "CKMBINDEX", "TROPONINI" in the last 168 hours. BNP (last 3 results) No results for input(s): "PROBNP" in the last 8760 hours. HbA1C: No results for input(s): "HGBA1C" in the last 72 hours. CBG: Recent Labs  Lab 11/30/21 2253  GLUCAP 80   Lipid Profile: No results for input(s): "CHOL", "HDL", "LDLCALC", "TRIG", "CHOLHDL", "LDLDIRECT" in the  last 72 hours. Thyroid Function Tests: No results for input(s): "TSH", "T4TOTAL", "FREET4", "T3FREE", "THYROIDAB" in the last 72 hours. Anemia Panel: No results for input(s): "VITAMINB12", "FOLATE", "FERRITIN", "TIBC", "IRON", "RETICCTPCT" in the last 72 hours. Most Recent Urinalysis On File:     Component Value Date/Time   COLORURINE RED (A) 11/21/2021 1035   APPEARANCEUR TURBID (A) 11/21/2021 1035   APPEARANCEUR Clear 09/08/2013 0133   LABSPEC >1.030 (H) 11/21/2021 1035   LABSPEC 1.013 09/08/2013 0133   PHURINE  11/21/2021 1035    TEST NOT REPORTED DUE TO COLOR INTERFERENCE OF URINE PIGMENT   GLUCOSEU (A) 11/21/2021 1035    TEST NOT REPORTED DUE TO COLOR INTERFERENCE OF URINE PIGMENT   GLUCOSEU 50 mg/dL 09/08/2013 0133   HGBUR (A) 11/21/2021 1035    TEST NOT REPORTED DUE TO COLOR INTERFERENCE OF URINE PIGMENT   BILIRUBINUR (A) 11/21/2021 1035    TEST NOT REPORTED DUE TO COLOR INTERFERENCE OF URINE PIGMENT   BILIRUBINUR Negative 09/08/2013 0133   KETONESUR (A) 11/21/2021 1035    TEST NOT REPORTED DUE TO COLOR INTERFERENCE OF URINE PIGMENT   PROTEINUR (A) 11/21/2021 1035    TEST NOT REPORTED DUE TO COLOR INTERFERENCE OF URINE PIGMENT   NITRITE (A) 11/21/2021 1035    TEST NOT REPORTED DUE TO COLOR INTERFERENCE OF URINE PIGMENT   LEUKOCYTESUR (A) 11/21/2021 1035    TEST NOT REPORTED DUE TO COLOR INTERFERENCE OF URINE PIGMENT   LEUKOCYTESUR Negative 09/08/2013 0133   Sepsis Labs: '@LABRCNTIP'$ (procalcitonin:4,lacticidven:4)  No results found for this or any previous visit (from the past 240 hour(s)).       Radiology Studies: US Venous Img Lower Right (DVT Study)  Result Date: 11/30/2021 CLINICAL DATA:  Right lower extremity pain. EXAM: Right LOWER EXTREMITY VENOUS DOPPLER ULTRASOUND TECHNIQUE: Gray-scale sonography with graded compression, as well as color Doppler and duplex ultrasound were performed to evaluate the lower extremity deep venous systems from the level of the  common femoral vein and including the common femoral, femoral, profunda femoral, popliteal and calf veins including the posterior tibial, peroneal and gastrocnemius veins when visible. The superficial great saphenous vein was also interrogated. Spectral Doppler was utilized to evaluate flow at rest and with distal augmentation maneuvers in the common femoral, femoral and popliteal veins. COMPARISON:  None Available. FINDINGS: Contralateral Common Femoral Vein: Respiratory phasicity is normal and symmetric with the symptomatic side. No evidence  of thrombus. Normal compressibility. Common Femoral Vein: No evidence of thrombus. Normal compressibility, respiratory phasicity and response to augmentation. Saphenofemoral Junction: Nonocclusive thrombus is noted. Profunda Femoral Vein: No evidence of thrombus. Normal compressibility and flow on color Doppler imaging. Femoral Vein: No evidence of thrombus. Normal compressibility, respiratory phasicity and response to augmentation. Popliteal Vein: No evidence of thrombus. Normal compressibility, respiratory phasicity and response to augmentation. Calf Veins: Occlusive thrombus in the peroneal vein. Superficial Great Saphenous Vein: Occlusive thrombus. Venous Reflux:  None. Other Findings:  None. IMPRESSION: 1. Occlusive thrombus in the greater saphenous vein and peroneal vein. 2. Nonocclusive thrombus at the saphenofemoral junction. These results will be called to the ordering clinician or representative by the Radiologist Assistant, and communication documented in the PACS or [employer]. Electronically Signed   By: Marijo Sanes M.D.   On: 11/30/2021 13:09   DG Chest 2 View  Result Date: 11/30/2021 CLINICAL DATA:  Chest pain. EXAM: CHEST - 2 VIEW COMPARISON:  November 22, 2021. FINDINGS: The heart size and mediastinal contours are within normal limits. Left internal jugular dialysis catheter is unchanged in position. Both lungs are clear. The visualized skeletal  structures are unremarkable. IMPRESSION: No active cardiopulmonary disease. Aortic Atherosclerosis (ICD10-I70.0). Electronically Signed   By: Marijo Conception M.D.   On: 11/30/2021 10:01            LOS: 4 days       Emeterio Reeve, DO [facility] 12/05/2021, 1:06 PM    Dictation software may have been used to generate the above note. Typos may occur and escape review in typed/dictated notes. Please contact Dr Sheppard Coil directly for clarity if needed.  Staff may message me via secure chat in [place]  but this may not receive an immediate response,  please page me for urgent matters!  If 7PM-7AM, please contact night coverage [email or web address]

## 2021-12-06 ENCOUNTER — Inpatient Hospital Stay: Payer: Medicare Other | Admitting: Anesthesiology

## 2021-12-06 ENCOUNTER — Other Ambulatory Visit: Payer: Self-pay

## 2021-12-06 ENCOUNTER — Encounter: Payer: Self-pay | Admitting: Internal Medicine

## 2021-12-06 ENCOUNTER — Encounter
Admission: EM | Disposition: A | Discharge status: Home / Self Care | Payer: Self-pay | Source: Home / Self Care | Attending: Internal Medicine

## 2021-12-06 DIAGNOSIS — N186 End stage renal disease: Secondary | ICD-10-CM | POA: Diagnosis not present

## 2021-12-06 DIAGNOSIS — Z992 Dependence on renal dialysis: Secondary | ICD-10-CM | POA: Diagnosis not present

## 2021-12-06 DIAGNOSIS — Z87448 Personal history of other diseases of urinary system: Secondary | ICD-10-CM

## 2021-12-06 HISTORY — PX: CYSTOSCOPY: SHX5120

## 2021-12-06 HISTORY — PX: URETEROSCOPY: SHX842

## 2021-12-06 LAB — BASIC METABOLIC PANEL
Anion gap: 8 (ref 5–15)
BUN: 23 mg/dL (ref 8–23)
CO2: 28 mmol/L (ref 22–32)
Calcium: 10.7 mg/dL — ABNORMAL HIGH (ref 8.9–10.3)
Chloride: 98 mmol/L (ref 98–111)
Creatinine, Ser: 6.23 mg/dL — ABNORMAL HIGH (ref 0.44–1.00)
GFR, Estimated: 7 mL/min — ABNORMAL LOW (ref >60)
Glucose, Bld: 94 mg/dL (ref 70–99)
Potassium: 4.3 mmol/L (ref 3.5–5.1)
Sodium: 134 mmol/L — ABNORMAL LOW (ref 135–145)

## 2021-12-06 SURGERY — CYSTOSCOPY
Anesthesia: General

## 2021-12-06 MED ORDER — LIDOCAINE HCL (CARDIAC) PF 100 MG/5ML IV SOSY
PREFILLED_SYRINGE | INTRAVENOUS | Status: DC | PRN
  Administered 2021-12-06: 60 mg via INTRAVENOUS

## 2021-12-06 MED ORDER — OXYCODONE HCL 5 MG/5ML PO SOLN: 5.0000 mg | Freq: Once | ORAL | Status: AC | PRN

## 2021-12-06 MED ORDER — FENTANYL CITRATE (PF) 100 MCG/2ML IJ SOLN
INTRAMUSCULAR | Status: DC | PRN
  Administered 2021-12-06: 25 ug via INTRAVENOUS

## 2021-12-06 MED ORDER — DEXAMETHASONE SODIUM PHOSPHATE 10 MG/ML IJ SOLN
INTRAMUSCULAR | Status: DC | PRN
  Administered 2021-12-06: 5 mg via INTRAVENOUS

## 2021-12-06 MED ORDER — ONDANSETRON HCL 4 MG/2ML IJ SOLN
INTRAMUSCULAR | Status: DC | PRN
  Administered 2021-12-06: 4 mg via INTRAVENOUS

## 2021-12-06 MED ORDER — VASOPRESSIN 20 UNIT/ML IV SOLN
INTRAVENOUS | Status: AC
  Filled 2021-12-06: qty 1

## 2021-12-06 MED ORDER — FENTANYL CITRATE (PF) 100 MCG/2ML IJ SOLN
25.0000 ug | INTRAMUSCULAR | Status: DC | PRN
  Administered 2021-12-06 (×3): 25 ug via INTRAVENOUS

## 2021-12-06 MED ORDER — PHENYLEPHRINE HCL (PRESSORS) 10 MG/ML IV SOLN
INTRAVENOUS | Status: DC | PRN
  Administered 2021-12-06 (×2): 160 ug via INTRAVENOUS

## 2021-12-06 MED ORDER — LACTATED RINGERS IV SOLN: INTRAVENOUS | Status: DC | PRN

## 2021-12-06 MED ORDER — FENTANYL CITRATE (PF) 100 MCG/2ML IJ SOLN
INTRAMUSCULAR | Status: AC
  Administered 2021-12-06: 25 ug via INTRAVENOUS
  Filled 2021-12-06: qty 2

## 2021-12-06 MED ORDER — PROPOFOL 10 MG/ML IV BOLUS
INTRAVENOUS | Status: DC | PRN
  Administered 2021-12-06: 50 mg via INTRAVENOUS
  Administered 2021-12-06: 80 mg via INTRAVENOUS
  Administered 2021-12-06: 20 mg via INTRAVENOUS

## 2021-12-06 MED ORDER — DEXAMETHASONE SODIUM PHOSPHATE 10 MG/ML IJ SOLN
INTRAMUSCULAR | Status: AC
  Filled 2021-12-06: qty 1

## 2021-12-06 MED ORDER — OXYCODONE HCL 5 MG PO TABS
5.0000 mg | ORAL_TABLET | Freq: Once | ORAL | Status: AC | PRN
  Administered 2021-12-06: 5 mg via ORAL

## 2021-12-06 MED ORDER — OXYCODONE HCL 5 MG PO TABS
ORAL_TABLET | ORAL | Status: AC
  Filled 2021-12-06: qty 1

## 2021-12-06 MED ORDER — STERILE WATER FOR IRRIGATION IR SOLN
Status: DC | PRN
  Administered 2021-12-06: 500 mL

## 2021-12-06 MED ORDER — SODIUM CHLORIDE 0.9 % IR SOLN
Status: DC | PRN
  Administered 2021-12-06: 400 mL

## 2021-12-06 MED ORDER — DEXAMETHASONE SODIUM PHOSPHATE 10 MG/ML IJ SOLN
INTRAMUSCULAR | Status: AC
  Filled 2021-12-06: qty 2

## 2021-12-06 MED ORDER — CARVEDILOL 3.125 MG PO TABS: 3.1250 mg | ORAL_TABLET | Freq: Two times a day (BID) | ORAL | 0 refills | Status: DC

## 2021-12-06 MED ORDER — CARVEDILOL 3.125 MG PO TABS: 3.1250 mg | ORAL_TABLET | Freq: Two times a day (BID) | ORAL | Status: DC

## 2021-12-06 MED ORDER — FENTANYL CITRATE (PF) 100 MCG/2ML IJ SOLN
INTRAMUSCULAR | Status: AC
  Filled 2021-12-06: qty 2

## 2021-12-06 MED ORDER — EPHEDRINE 5 MG/ML INJ
INTRAVENOUS | Status: AC
  Filled 2021-12-06: qty 5

## 2021-12-06 MED ORDER — ONDANSETRON HCL 4 MG/2ML IJ SOLN
INTRAMUSCULAR | Status: AC
  Filled 2021-12-06: qty 4

## 2021-12-06 MED ORDER — EPHEDRINE SULFATE (PRESSORS) 50 MG/ML IJ SOLN
INTRAMUSCULAR | Status: DC | PRN
  Administered 2021-12-06: 10 mg via INTRAVENOUS
  Administered 2021-12-06 (×2): 5 mg via INTRAVENOUS

## 2021-12-06 MED ORDER — ONDANSETRON HCL 4 MG/2ML IJ SOLN
INTRAMUSCULAR | Status: AC
  Filled 2021-12-06: qty 2

## 2021-12-06 MED ORDER — APIXABAN 2.5 MG PO TABS: 2.5000 mg | ORAL_TABLET | Freq: Two times a day (BID) | ORAL | 0 refills | Status: DC

## 2021-12-06 SURGICAL SUPPLY — 35 items
BAG DRAIN SIEMENS DORNER NS (MISCELLANEOUS) ×2 IMPLANT
BAG URINE DRAIN 2000ML AR STRL (UROLOGICAL SUPPLIES) ×2 IMPLANT
BRUSH SCRUB EZ 1% IODOPHOR (MISCELLANEOUS) ×2 IMPLANT
CATH FOLEY 2WAY 18X30 (CATHETERS) IMPLANT
CATH FOLEY 2WAY SIL 18X30 (CATHETERS)
DRAPE UTILITY 15X26 TOWEL STRL (DRAPES) ×2 IMPLANT
DRSG TELFA 3X4 N-ADH STERILE (GAUZE/BANDAGES/DRESSINGS) ×2 IMPLANT
ELECT LOOP 22F BIPOLAR SML (ELECTROSURGICAL)
ELECT REM PT RETURN 9FT ADLT (ELECTROSURGICAL) ×2
ELECTRODE LOOP 22F BIPOLAR SML (ELECTROSURGICAL) IMPLANT
ELECTRODE REM PT RTRN 9FT ADLT (ELECTROSURGICAL) ×2 IMPLANT
GAUZE 4X4 16PLY ~~LOC~~+RFID DBL (SPONGE) ×4 IMPLANT
GLOVE SURG UNDER POLY LF SZ7.5 (GLOVE) ×2 IMPLANT
GOWN STRL REUS W/ TWL LRG LVL3 (GOWN DISPOSABLE) ×4 IMPLANT
GOWN STRL REUS W/ TWL XL LVL3 (GOWN DISPOSABLE) ×2 IMPLANT
GOWN STRL REUS W/TWL LRG LVL3 (GOWN DISPOSABLE) ×4
GOWN STRL REUS W/TWL XL LVL3 (GOWN DISPOSABLE) ×2
GOWN STRL REUS W/TWL XL LVL4 (GOWN DISPOSABLE) ×2 IMPLANT
GUIDEWIRE SENSOR ANG DUAL FLEX (WIRE) IMPLANT
IV NS IRRIG 3000ML ARTHROMATIC (IV SOLUTION) ×4 IMPLANT
KIT TURNOVER CYSTO (KITS) ×2 IMPLANT
LOOP CUT BIPOLAR 24F LRG (ELECTROSURGICAL) IMPLANT
MANIFOLD NEPTUNE II (INSTRUMENTS) ×2 IMPLANT
NDL SAFETY ECLIP 18X1.5 (MISCELLANEOUS) ×2 IMPLANT
PACK CYSTO AR (MISCELLANEOUS) ×2 IMPLANT
SET CYSTO W/LG BORE CLAMP LF (SET/KITS/TRAYS/PACK) ×2 IMPLANT
SET IRRIG Y TYPE TUR BLADDER L (SET/KITS/TRAYS/PACK) ×2 IMPLANT
SURGILUBE 2OZ TUBE FLIPTOP (MISCELLANEOUS) ×2 IMPLANT
SYR 10ML LL (SYRINGE) ×2 IMPLANT
SYR TOOMEY IRRIG 70ML (MISCELLANEOUS) ×2
SYRINGE TOOMEY IRRIG 70ML (MISCELLANEOUS) ×2 IMPLANT
TRAP FLUID SMOKE EVACUATOR (MISCELLANEOUS) ×2 IMPLANT
WATER STERILE IRR 1000ML POUR (IV SOLUTION) ×2 IMPLANT
WATER STERILE IRR 3000ML UROMA (IV SOLUTION) ×2 IMPLANT
WATER STERILE IRR 500ML POUR (IV SOLUTION) ×2 IMPLANT

## 2021-12-06 NOTE — Care Management Important Message (Signed)
Important Message  Patient Details  Name: Teresa Cook MRN: [id number] Date of Birth: 04-06-1955   Medicare Important Message Given:  Yes     Juliann Pulse A Josephmichael Lisenbee 12/06/2021, 12:12 PM

## 2021-12-06 NOTE — Progress Notes (Signed)
[facility]  ROUNDING NOTE   Subjective:   Teresa Cook is a 66 year old African-American female with past medical conditions including hypertension, gout, anxiety with depression, sickle cell trait, systolic heart failure with a EF 40 to 45%, breast cancer with bilateral mastectomy, seizures, and end-stage renal disease on hemodialysis.  Patient presents to the emergency department via EMS for multiple syncopal episodes.  Patient has been admitted for Acute deep vein thrombosis (DVT) of proximal vein of right lower extremity (HCC) [I82.4Y1] Deep venous thrombosis (DVT) of right peroneal vein ([place]) [I82.451]  Patient is known to our practice and receives outpatient dialysis treatments at [facility] on a MWF schedule, supervised by Dr. Candiss Norse.    Patient seen resting in bed, alert and oriented Currently n.p.o. for scheduled procedure Denies pain or discomfort Continues to have hematuria  Objective:  Vital signs in last 24 hours:  Temp:  [98.1 F (36.7 C)-98.9 F (37.2 C)] 98.1 F (36.7 C) (11/30 0813) Pulse Rate:  [67-81] 76 (11/30 1316) Resp:  [16-17] 17 (11/30 1316) BP: (78-148)/(48-80) 147/80 (11/30 1316) SpO2:  [97 %-99 %] 98 % (11/30 1316)  Weight change:  Filed Weights   12/03/21 1150 12/05/21 0830 12/05/21 1152  Weight: 52.7 kg 53.1 kg 51.9 kg     Intake/Output: I/O last 3 completed shifts: In: -  Out: 2000 [Other:2000]   Intake/Output this shift:  No intake/output data recorded.  Physical Exam: General: NAD  Head: Normocephalic, atraumatic. Moist oral mucosal membranes  Eyes: Anicteric  Lungs:  Clear to auscultation, normal effort, room air  Heart: Regular rate and rhythm  Abdomen:  Soft, tender, BS present  Extremities:  No peripheral edema.  Neurologic: Nonfocal, moving all four extremities  Skin: No lesions  Access: Lt Permcath    Basic Metabolic Panel: Recent Labs  Lab 11/30/21 0941 12/01/21 0814 12/03/21 0759  12/05/21 0905 12/06/21 1223  NA 143 140 138 136 134*  K 3.5 3.8 3.9 4.1 4.3  CL 106 102 102 97* 98  CO2 '23 25 25 27 28  '$ GLUCOSE 100* 81 91 95 94  BUN 30* 20 38* 36* 23  CREATININE 8.35* 5.79* 9.28* 8.35* 6.23*  CALCIUM 10.7* 10.1 9.8 10.2 10.7*  MG 2.2  --   --   --   --   PHOS  --   --  5.0* 4.7*  --      Liver Function Tests: Recent Labs  Lab 12/03/21 0759 12/05/21 0905  ALBUMIN 2.8* 2.7*    No results for input(s): "LIPASE", "AMYLASE" in the last 168 hours. No results for input(s): "AMMONIA" in the last 168 hours.  CBC: Recent Labs  Lab 11/30/21 0941 12/01/21 0814 12/02/21 0420 12/03/21 0445 12/04/21 0638 12/05/21 0315 12/05/21 0905  WBC 10.8* 6.6 6.1 5.9  --   --  6.3  NEUTROABS  --   --   --   --   --   --  4.0  HGB 10.7* 8.9* 8.0* 8.2* 8.3* 8.6* 8.6*  HCT 33.6* 27.4* 24.7* 25.1* 25.8* 26.3* 27.1*  MCV 86.6 85.4 85.2 85.4  --   --  85.5  PLT 219 154 150 150  --   --  145*     Cardiac Enzymes: No results for input(s): "CKTOTAL", "CKMB", "CKMBINDEX", "TROPONINI" in the last 168 hours.  BNP: Invalid input(s): "POCBNP"  CBG: Recent Labs  Lab 11/30/21 2253  GLUCAP 80      Microbiology: Results for orders placed or performed during the hospital encounter  of 11/05/21  Blood Culture (routine x 2)     Status: None   Collection Time: 11/05/21  3:20 AM   Specimen: BLOOD  Result Value Ref Range Status   Specimen Description BLOOD RIGHT FA  Final   Special Requests   Final    BOTTLES DRAWN AEROBIC AND ANAEROBIC Blood Culture results may not be optimal due to an inadequate volume of blood received in culture bottles   Culture   Final    NO GROWTH 5 DAYS Performed at [facility], [address] [place], [place] [address]    Report Status 11/10/2021 FINAL  Final  SARS Coronavirus 2 by RT PCR (hospital order, performed in [facility] hospital lab) *cepheid single result test* Anterior Nasal Swab     Status: None   Collection Time: 11/05/21   3:20 AM   Specimen: Anterior Nasal Swab  Result Value Ref Range Status   SARS Coronavirus 2 by RT PCR NEGATIVE NEGATIVE Final    Comment: (NOTE) SARS-CoV-2 target nucleic acids are NOT DETECTED.  The SARS-CoV-2 RNA is generally detectable in upper and lower respiratory specimens during the acute phase of infection. The lowest concentration of SARS-CoV-2 viral copies this assay can detect is 250 copies / mL. A negative result does not preclude SARS-CoV-2 infection and should not be used as the sole basis for treatment or other patient management decisions.  A negative result may occur with improper specimen collection / handling, submission of specimen other than nasopharyngeal swab, presence of viral mutation(s) within the areas targeted by this assay, and inadequate number of viral copies (<250 copies / mL). A negative result must be combined with clinical observations, patient history, and epidemiological information.  Fact Sheet for Patients:   https://www.patel.info/  Fact Sheet for Healthcare Providers: https://hall.com/  This test is not yet approved or  cleared by the [place] FDA and has been authorized for detection and/or diagnosis of SARS-CoV-2 by FDA under an Emergency Use Authorization (EUA).  This EUA will remain in effect (meaning this test can be used) for the duration of the COVID-19 declaration under Section 564(b)(1) of the Act, 21 U.S.C. section 360bbb-3(b)(1), unless the authorization is terminated or revoked sooner.  Performed at [facility], [address] [place], [place] [address]   Blood Culture (routine x 2)     Status: None   Collection Time: 11/05/21  3:21 AM   Specimen: BLOOD  Result Value Ref Range Status   Specimen Description BLOOD RIGHT HAND  Final   Special Requests   Final    BOTTLES DRAWN AEROBIC AND ANAEROBIC Blood Culture results may not be optimal due to an inadequate volume of  blood received in culture bottles   Culture   Final    NO GROWTH 5 DAYS Performed at [facility], [address] [place], [place] [address]    Report Status 11/10/2021 FINAL  Final  MRSA Next Gen by PCR, Nasal     Status: None   Collection Time: 11/05/21  6:57 AM   Specimen: Nasal Mucosa; Nasal Swab  Result Value Ref Range Status   MRSA by PCR Next Gen NOT DETECTED NOT DETECTED Final    Comment: (NOTE) The GeneXpert MRSA Assay (FDA approved for NASAL specimens only), is one component of a comprehensive MRSA colonization surveillance program. It is not intended to diagnose MRSA infection nor to guide or monitor treatment for MRSA infections. Test performance is not FDA approved in patients less than 2 years  old. Performed at [facility], [address] [place], [place] [address]     Coagulation Studies: No results for input(s): "LABPROT", "INR" in the last 72 hours.   Urinalysis: No results for input(s): "COLORURINE", "LABSPEC", "PHURINE", "GLUCOSEU", "HGBUR", "BILIRUBINUR", "KETONESUR", "PROTEINUR", "UROBILINOGEN", "NITRITE", "LEUKOCYTESUR" in the last 72 hours.  Invalid input(s): "APPERANCEUR"     Imaging: CT ABDOMEN PELVIS WO CONTRAST  Result Date: 12/05/2021 CLINICAL DATA:  Hematuria. End-stage renal disease on hemodialysis. Recent large retroperitoneal hemorrhage. EXAM: CT ABDOMEN AND PELVIS WITHOUT CONTRAST TECHNIQUE: Multidetector CT imaging of the abdomen and pelvis was performed following the standard protocol without IV contrast. RADIATION DOSE REDUCTION: This exam was performed according to the departmental dose-optimization program which includes automated exposure control, adjustment of the mA and/or kV according to patient size and/or use of iterative reconstruction technique. COMPARISON:  11/07/2021. FINDINGS: Lower chest: Minimal scarring. Heart is at the upper limits of normal in size. Decreased attenuation of the intravascular compartment  is indicative of anemia. Atherosclerotic calcification of the aorta, aortic valve and coronary arteries. No pericardial or pleural effusion. Distal esophagus is grossly unremarkable. Hepatobiliary: Liver is unremarkable. Residual vicarious excretion of contrast in the gallbladder. No biliary ductal dilatation. Pancreas: Negative. Spleen: Negative. Adrenals/Urinary Tract: Heterogeneous right renal subcapsular fluid collection measures 4.5 x 6.5 cm, slightly decreased in size from 5.9 x 6.8 cm on 11/17/2021. Right pararenal heterogeneous fluid and stranding have improved somewhat as well with the largest collection seen inferiorly, anterior to the right psoas muscle, measuring 5.3 x 11.9 cm (2/39 and 46), previously 6.4 x 14.6 cm. Numerous high and low-attenuation lesions in the left kidney. No specific follow-up necessary. Stones in the left kidney. Ureters are decompressed. Bladder is low in volume. Stomach/Bowel: Stomach, small bowel and colon are unremarkable. Appendix is not readily visualized. Vascular/Lymphatic: Atherosclerotic calcification of the aorta. No pathologically enlarged lymph nodes. Numerous embolization coils in the distribution of the right renal vasculature. Reproductive: Hysterectomy.  No adnexal mass. Other: Small pelvic free fluid. Mesenteries and peritoneum are unremarkable. Musculoskeletal: Degenerative changes in the spine. No worrisome lytic or sclerotic lesions. IMPRESSION: 1. Slight decrease in size of subacute right renal subcapsular and retroperitoneal hematomas. 2. Left renal stones. 3. Aortic atherosclerosis (ICD10-I70.0). Coronary artery calcification. Electronically Signed   By: Lorin Picket M.D.   On: 12/05/2021 13:13     Medications:    [MAR Hold] sodium chloride 10 mL/hr at 12/03/21 0750   [MAR Hold] cefTRIAXone (ROCEPHIN)  IV      [MAR Hold] amiodarone  200 mg Oral BID   [MAR Hold] apixaban  2.5 mg Oral BID   [MAR Hold] carvedilol  3.125 mg Oral BID   [MAR Hold]  Chlorhexidine Gluconate Cloth  6 each Topical Q0600   [MAR Hold] multivitamin  1 tablet Oral QHS   [MAR Hold] pantoprazole  40 mg Oral BID   [MAR Hold] sacubitril-valsartan  1 tablet Oral BID   [MAR Hold] sertraline  100 mg Oral Daily   [MAR Hold] sevelamer carbonate  2,400 mg Oral TID with meals   [MAR Hold] sodium chloride flush  3 mL Intravenous Q12H   [MAR Hold] traZODone  50 mg Oral QHS   [MAR Hold] zolpidem  5 mg Oral QHS   [MAR Hold] sodium chloride, [MAR Hold] acetaminophen **OR** [MAR Hold] acetaminophen, [MAR Hold] ALPRAZolam, [MAR Hold] ondansetron **OR** [MAR Hold] ondansetron (ZOFRAN) IV, [MAR Hold] oxyCODONE, [MAR Hold] polyethylene glycol, [MAR Hold] sodium chloride flush  Assessment/ Plan:  Ms. Zakkiyya Barno  is a 66 y.o.  female is a 66 year old African-American female with past medical conditions including hypertension, gout, anxiety with depression, sickle cell trait, systolic heart failure with a EF 40 to 45%, breast cancer with bilateral mastectomy, seizures, and end-stage renal disease on hemodialysis.  Patient presents to the emergency department via EMS for multiple syncopal episodes.  Patient has been admitted for Acute deep vein thrombosis (DVT) of proximal vein of right lower extremity (HCC) [I82.4Y1] Deep venous thrombosis (DVT) of right peroneal vein (HCC) [I82.451]  [facility]/MWF/Lt Permcath  End-stage renal disease on hemodialysis.   Patient is on Monday Wednesday Friday schedule. Dialysis received yesterday, UF 1 L achieved.  Next treatment scheduled for Friday.   2. Anemia of chronic kidney disease  Lab Results  Component Value Date   HGB 8.6 (L) 12/05/2021  During last admission patient had severe hemorrhage in the right kidney requiring coil embolization.  Hemoglobin below target but stable.  Patient does receive Mircera outpatient.  Urology scheduled for cystoscopy with possible TURBT today  3. Secondary Hyperparathyroidism: with  outpatient labs: PTH 379, phosphorus 5.7, calcium 9.4 on June 18, 2021.   Lab Results  Component Value Date   PTH 214 (H) 07/08/2021   CALCIUM 10.7 (H) 12/06/2021   PHOS 4.7 (H) 12/05/2021  Bone mineral is within acceptable range. Continue sevelamer with all meals.  4.  Hypertension With chronic kidney disease Currently receiving amiodarone, carvedilol, hydralazine, and Entresto.  Blood pressure 116/72 during dialysis.  5.  .Atrial fibrillation now admitted with DVT Patient has history of atrial fibrillation, and now is being admitted with DVT Patient prescribed apixaban, cardiology adjusted to 2.5 mg twice daily.       LOS: [place] 11/30/20231:42 PM

## 2021-12-06 NOTE — Progress Notes (Incomplete)
Contacted provider; pt bp 78/48

## 2021-12-06 NOTE — Progress Notes (Signed)
Mobility Specialist - Progress Note   12/06/21 1103  Mobility  Activity Ambulated independently in hallway;Stood at bedside;Dangled on edge of bed  Level of Assistance Independent  Assistive Device None  Distance Ambulated (ft) 160 ft  Activity Response Tolerated well  Mobility Referral Yes  $Mobility charge 1 Mobility   Pt supine in bed on RA. Pt STS and ambulates 1 lap around NS Indep. Pt returns to bed with needs in reach.   Gretchen Short  Mobility Specialist  12/06/21 11:04 AM

## 2021-12-06 NOTE — Anesthesia Preprocedure Evaluation (Signed)
Anesthesia Evaluation  Patient identified by MRN, date of birth, ID band Patient awake    Reviewed: Allergy & Precautions, NPO status , Patient's Chart, lab work & pertinent test results  History of Anesthesia Complications Negative for: history of anesthetic complications  Airway Mallampati: III  TM Distance: >3 FB Neck ROM: full    Dental  (+) Chipped, Poor Dentition, Missing   Pulmonary neg pulmonary ROS, neg shortness of breath   Pulmonary exam normal        Cardiovascular Exercise Tolerance: Good hypertension, (-) angina +CHF  + dysrhythmias Atrial Fibrillation      Neuro/Psych  Headaches, Seizures -,   negative psych ROS   GI/Hepatic Neg liver ROS,GERD  Controlled,,  Endo/Other  negative endocrine ROS    Renal/GU DialysisRenal disease     Musculoskeletal   Abdominal   Peds  Hematology  (+) Blood dyscrasia, anemia   Anesthesia Other Findings Past Medical History: No date: Anemia No date: Anxiety 01/2016: Breast cancer ([place])     Comment:  bilateral No date: CHF (congestive heart failure) (HCC) No date: Chronic kidney disease No date: Depression No date: Dialysis patient [facility]) No date: DVT (deep venous thrombosis) ([place])     Comment:  left leg 1985: DVT (deep venous thrombosis) (HCC)     Comment:  right thigh No date: Dysrhythmia No date: Gout No date: Headache No date: HTN (hypertension) No date: Hypertension No date: Parathyroid abnormality (HCC) No date: Parathyroid disease ([place]) 12/2015: Pneumonia No date: Psoriasis No date: Renal insufficiency No date: Sickle cell trait ([place])     Comment:  traits  Past Surgical History: 1980: ABDOMINAL HYSTERECTOMY No date: APPENDECTOMY 10/28/2013: BREAST BIOPSY; Left     Comment:  benign 2002: BREAST EXCISIONAL BIOPSY; Left     Comment:  benign 04/04/2020: CORONARY ANGIOGRAPHY; N/A     Comment:  Procedure: CORONARY ANGIOGRAPHY;  Surgeon: Wellington Hampshire, MD;  Location: [place] [facility];                Service: Cardiovascular;  Laterality: N/A; 2014: INSERTION OF DIALYSIS CATHETER 01/23/2016: LIPOMA EXCISION; N/A     Comment:  Procedure: EXCISION LIPOMA;  Surgeon: Hubbard Robinson, MD;  Location: [facility];  Service: General;                Laterality: N/A; 01/23/2016: MASTECTOMY W/ SENTINEL NODE BIOPSY; Bilateral     Comment:  Procedure: bilateral MASTECTOMY WITH  bilateral SENTINEL              LYMPH NODE BIOPSY possible left axillary node dissection               forehead lipoma removal;  Surgeon: Hubbard Robinson,               MD;  Location: [facility];  Service: General;  Laterality:               Bilateral; No date: PARTIAL HYSTERECTOMY 12/25/2015: PERIPHERAL VASCULAR CATHETERIZATION; N/A     Comment:  Procedure: Dialysis/Perma Catheter Insertion;  Surgeon:               Algernon Huxley, MD;  Location: [place] [facility];                Service: Cardiovascular;  Laterality: N/A; 01/22/2016: PERIPHERAL VASCULAR  CATHETERIZATION; Left     Comment:  Procedure: Dialysis/Perma Catheter Insertion;  Surgeon:               Algernon Huxley, MD;  Location: [place] [facility];                Service: Cardiovascular;  Laterality: Left; 01/26/2016: PERIPHERAL VASCULAR CATHETERIZATION; N/A     Comment:  Procedure: Dialysis/Perma Catheter Insertion;  Surgeon:               Katha Cabal, MD;  Location: [place] [facility];                Service: Cardiovascular;  Laterality: N/A; 12/20/2015: PORT-A-CATH REMOVAL; N/A     Comment:  Procedure: REMOVAL PORT-A-CATH;  Surgeon: Hubbard Robinson, MD;  Location: [facility];  Service: General;                Laterality: N/A;  left   08/21/2015: PORTACATH PLACEMENT; Left     Comment:  Procedure: INSERTION PORT-A-CATH;  Surgeon: Hubbard Robinson, MD;  Location: [facility];  Service: General;                Laterality: Left; 2017: REMOVAL OF  A DIALYSIS CATHETER 11/05/2021: RENAL ANGIOGRAPHY; Right     Comment:  Procedure: RENAL ANGIOGRAPHY;  Surgeon: Algernon Huxley,               MD;  Location: [place] [facility];  Service:               Cardiovascular;  Laterality: Right;  with embolization 04/04/2020: RIGHT HEART CATH; N/A     Comment:  Procedure: RIGHT HEART CATH;  Surgeon: Wellington Hampshire, MD;  Location: [place] [facility];  Service:               Cardiovascular;  Laterality: N/A;  BMI    Body Mass Index: 19.04 kg/m      Reproductive/Obstetrics negative OB ROS                             Anesthesia Physical Anesthesia Plan  ASA: 4  Anesthesia Plan: General LMA   Post-op Pain Management:    Induction: Intravenous  PONV Risk Score and Plan: Dexamethasone, Ondansetron, Midazolam and Treatment may vary due to age or medical condition  Airway Management Planned: LMA  Additional Equipment:   Intra-op Plan:   Post-operative Plan: Extubation in OR  Informed Consent: I have reviewed the patients History and Physical, chart, labs and discussed the procedure including the risks, benefits and alternatives for the proposed anesthesia with the patient or authorized representative who has indicated his/her understanding and acceptance.     Dental Advisory Given  Plan Discussed with: Anesthesiologist, CRNA and Surgeon  Anesthesia Plan Comments: (Patient and family consented for risks of anesthesia including but not limited to:  - adverse reactions to medications - damage to eyes, teeth, lips or other oral mucosa - nerve damage due to positioning  - sore throat or hoarseness - Damage to heart, brain, nerves, lungs, other parts of body or loss of life  They voiced understanding.)       Anesthesia  Quick Evaluation

## 2021-12-06 NOTE — Discharge Summary (Signed)
Physician Discharge Summary   Patient: Teresa Cook MRN: [id number]  DOB: [birth year]   Admit:     Date of Admission: 11/30/2021 Admitted from: home   Discharge: Date of discharge: 12/06/21 Disposition: Home Condition at discharge: good  CODE STATUS: FULL CODE      Discharge Physician: Emeterio Reeve, DO [facility]     PCP: [facility]  Recommendations for Outpatient Follow-up:  Follow up with PCP Center, [facility]in Saba in 2-4 weeks weeks Please obtain labs/tests: CBC in 2-4 weeks Please follow up on the following pending results: none    Discharge Instructions     Diet - low sodium heart healthy   Complete by: As directed    Increase activity slowly   Complete by: As directed    No dressing needed   Complete by: As directed          Discharge Diagnoses: Principal Problem:   ESRD on hemodialysis ([place]) Active Problems:   Deep venous thrombosis (DVT) of right peroneal vein (HCC)   Hypercalcemia   Hypertension   Moderate recurrent major depression (HCC)   PAF (paroxysmal atrial fibrillation) (HCC)   Chronic diastolic CHF (congestive heart failure) ([place])   Urethral bleeding       Hospital Course: Teresa Cook is a 66 y.o. female with medical history significant for breast cancer status post bilateral mastectomy, seizure disorder, chronic diastolic dysfunction CHF, hypertension, anxiety with depression, end-stage renal disease on hemodialysis (M/W/F), recent discharge from the hospital (11/22/2021) after hospitalization for retroperitoneal bleed following a renal artery hemorrhage in the setting of supratherapeutic INR, s/p coil embolization of right renal artery.  Eliquis had been held at discharge because of anemia and hematuria.   11/24: She presented to the emergency room from dialysis center because she was unable to get her scheduled renal replacement therapy.  She said the machine kept  clotting off so she was referred to the emergency room for dialysis.  She complained of chest tightness, shortness of breath, pain in her right thigh and right calf. She was found to have right lower extremity DVT (occlusive thrombus in the greater saphenous vein and peroneal vein and nonocclusive thrombus at the saphenofemoral junction).  She was treated with IV heparin drip. Underwent dialysis 11/25: vascular surgery saw patient - risks > benefits for surgical intervention, recs should be started back on anticoagulation. 11/26: IV heparin --> po Eliquis, noted urethral bleeding / hematuria  11/27: underwent dialysis. [facility]  11/28: vascular confirms plan for Eliquis 2.5 mg bid, concern for thrombosing IVC filter if this were to be placed. Urology saw pt - CT abd/pelvis ordered. Tentatively planning cystoscopy / ureteroscopy Thurs 11/30 11/29: stable awaiting procedure 11/30: no active bleeding on cystoscopy, per urology ok to d/c home.   Consultants:  Urology  Vascular Surgery  Nephrology   Procedures: none      ASSESSMENT & PLAN:   Principal Problem:   ESRD on hemodialysis ([place]) Active Problems:   Deep venous thrombosis (DVT) of right peroneal vein (HCC)   Hypercalcemia   Hypertension   Moderate recurrent major depression (HCC)   PAF (paroxysmal atrial fibrillation) (HCC)   Chronic diastolic CHF (congestive heart failure) (HCC)   Urethral bleeding   Right lower extremity DVT Vascular surgeon recommended decreasing Eliquis from 5 mg to 2.5 mg twice daily because she is not a good candidate for IVC filter as she may develop thrombosis of IVC filter because of hypercoagulability.  D/c home on Eliquis 2.5 mg bid    Urethral bleeding Follow as directed w/ Dr Bernardo Heater (urology)   Hypotension BP has been running low.   Discontinue hydralazine.   Continue carvedilol at lowered dose Continue Entresto for now.   Antihypertensives may need to be adjusted depending on her  blood pressure outpatient     ESRD on MWF schedule Plan for hemodialysis as usual.   Follow-up with nephrologist.   Hypercalcemia - resolved Monitor periodic BMP   Chronic diastolic CHF (congestive heart failure, hypertension Compensated.   antihypertensives as above   PAF (paroxysmal atrial fibrillation) (HCC) Continue amiodarone and carvedilol   Moderate recurrent major depression (HCC) Continue sertraline and trazodone    Chronic anemia Overall, downward trending hemoglobin since admission but H&H is stable.   Monitor H&H on Eliquis.   Stage II decubitus ulcers on bilateral buttocks More prominent on the right buttock.  These were present on admission and now healing             Discharge Instructions  Allergies as of 12/06/2021       Reactions   Gabapentin Other (See Comments)   Seizure   Adhesive [tape] Itching   Silk tape is ok to use.        Medication List     STOP taking these medications    cinacalcet 60 MG tablet Commonly known as: SENSIPAR   oxyCODONE 5 MG immediate release tablet Commonly known as: Oxy IR/ROXICODONE       TAKE these medications    ALPRAZolam 0.25 MG tablet Commonly known as: XANAX Take 1 tablet (0.25 mg total) by mouth 2 (two) times daily as needed for anxiety.   amiodarone 200 MG tablet Commonly known as: PACERONE Take 1 tablet (200 mg total) by mouth 2 (two) times daily.   apixaban 2.5 MG Tabs tablet Commonly known as: ELIQUIS Take 1 tablet (2.5 mg total) by mouth 2 (two) times daily.   b complex-vitamin c-folic acid 0.8 MG Tabs tablet Take 1 tablet by mouth daily.   calcium carbonate 500 MG chewable tablet Commonly known as: TUMS - dosed in mg elemental calcium Chew 1 tablet (200 mg of elemental calcium total) by mouth 2 (two) times daily as needed for indigestion or heartburn.   carvedilol 12.5 MG tablet Commonly known as: Coreg Take 1 tablet (12.5 mg total) by mouth 2 (two) times daily.    Entresto 24-26 MG Generic drug: sacubitril-valsartan Take 1 tablet by mouth 2 (two) times daily.   hydrALAZINE 25 MG tablet Commonly known as: APRESOLINE Take 1 tablet (25 mg total) by mouth 3 (three) times daily.   lidocaine 5 % Commonly known as: LIDODERM Place 2 patches onto the skin daily. Remove & Discard patch within 12 hours or as directed by MD   pantoprazole 40 MG tablet Commonly known as: PROTONIX Take 1 tablet (40 mg total) by mouth 2 (two) times daily.   polyethylene glycol 17 g packet Commonly known as: MIRALAX / GLYCOLAX Take 17 g by mouth daily as needed for mild constipation.   sertraline 100 MG tablet Commonly known as: ZOLOFT Take 100 mg by mouth daily.   sevelamer carbonate 800 MG tablet Commonly known as: RENVELA Take 3 tablets (2,400 mg total) by mouth with breakfast, with lunch, and with evening meal. One with snacks   traZODone 50 MG tablet Commonly known as: DESYREL Take 50 mg by mouth at bedtime.   zolpidem 10 MG tablet Commonly known as: AMBIEN Take  0.5 tablets (5 mg total) by mouth at bedtime.               Discharge Care Instructions  (From admission, onward)           Start     Ordered   12/06/21 0000  No dressing needed        12/06/21 1619              Allergies  Allergen Reactions   Gabapentin Other (See Comments)    Seizure   Adhesive [Tape] Itching    Silk tape is ok to use.     Subjective: pt examined in PACU, awake, alert. No concerns other than back pain and feeling tired.    Discharge Exam: BP 116/77 (BP Location: Right Arm)   Pulse (!) 107   Temp (!) 97.5 F (36.4 C)   Resp 18   Ht '5\' 5"'$  (1.651 m)   Wt 51.9 kg   SpO2 99%   BMI 19.04 kg/m  General: Pt is alert, awake, not in acute distress Cardiovascular: RRR, S1/S2 +, no rubs, no gallops Respiratory: CTA bilaterally, no wheezing, no rhonchi Abdominal: Soft, NT, ND, bowel sounds + Extremities: no edema, no cyanosis     The results of  significant diagnostics from this hospitalization (including imaging, microbiology, ancillary and laboratory) are listed below for reference.     Microbiology: No results found for this or any previous visit (from the past 240 hour(s)).   Labs: BNP (last 3 results) Recent Labs    07/26/21 2210 11/05/21 0320  BNP 1,390.4* 532.9*   Basic Metabolic Panel: Recent Labs  Lab 11/30/21 0941 12/01/21 0814 12/03/21 0759 12/05/21 0905 12/06/21 1223  NA 143 140 138 136 134*  K 3.5 3.8 3.9 4.1 4.3  CL 106 102 102 97* 98  CO2 '23 25 25 27 28  '$ GLUCOSE 100* 81 91 95 94  BUN 30* 20 38* 36* 23  CREATININE 8.35* 5.79* 9.28* 8.35* 6.23*  CALCIUM 10.7* 10.1 9.8 10.2 10.7*  MG 2.2  --   --   --   --   PHOS  --   --  5.0* 4.7*  --    Liver Function Tests: Recent Labs  Lab 12/03/21 0759 12/05/21 0905  ALBUMIN 2.8* 2.7*   No results for input(s): "LIPASE", "AMYLASE" in the last 168 hours. No results for input(s): "AMMONIA" in the last 168 hours. CBC: Recent Labs  Lab 11/30/21 0941 12/01/21 0814 12/02/21 0420 12/03/21 0445 12/04/21 0638 12/05/21 0315 12/05/21 0905  WBC 10.8* 6.6 6.1 5.9  --   --  6.3  NEUTROABS  --   --   --   --   --   --  4.0  HGB 10.7* 8.9* 8.0* 8.2* 8.3* 8.6* 8.6*  HCT 33.6* 27.4* 24.7* 25.1* 25.8* 26.3* 27.1*  MCV 86.6 85.4 85.2 85.4  --   --  85.5  PLT 219 154 150 150  --   --  145*   Cardiac Enzymes: No results for input(s): "CKTOTAL", "CKMB", "CKMBINDEX", "TROPONINI" in the last 168 hours. BNP: Invalid input(s): "POCBNP" CBG: Recent Labs  Lab 11/30/21 2253  GLUCAP 80   D-Dimer No results for input(s): "DDIMER" in the last 72 hours. Hgb A1c No results for input(s): "HGBA1C" in the last 72 hours. Lipid Profile No results for input(s): "CHOL", "HDL", "LDLCALC", "TRIG", "CHOLHDL", "LDLDIRECT" in the last 72 hours. Thyroid function studies No results for input(s): "TSH", "T4TOTAL", "T3FREE", "THYROIDAB" in the last  72 hours.  Invalid input(s):  "FREET3" Anemia work up No results for input(s): "VITAMINB12", "FOLATE", "FERRITIN", "TIBC", "IRON", "RETICCTPCT" in the last 72 hours. Urinalysis    Component Value Date/Time   COLORURINE RED (A) 11/21/2021 1035   APPEARANCEUR TURBID (A) 11/21/2021 1035   APPEARANCEUR Clear 09/08/2013 0133   LABSPEC >1.030 (H) 11/21/2021 1035   LABSPEC 1.013 09/08/2013 0133   PHURINE  11/21/2021 1035    TEST NOT REPORTED DUE TO COLOR INTERFERENCE OF URINE PIGMENT   GLUCOSEU (A) 11/21/2021 1035    TEST NOT REPORTED DUE TO COLOR INTERFERENCE OF URINE PIGMENT   GLUCOSEU 50 mg/dL 09/08/2013 0133   HGBUR (A) 11/21/2021 1035    TEST NOT REPORTED DUE TO COLOR INTERFERENCE OF URINE PIGMENT   BILIRUBINUR (A) 11/21/2021 1035    TEST NOT REPORTED DUE TO COLOR INTERFERENCE OF URINE PIGMENT   BILIRUBINUR Negative 09/08/2013 0133   KETONESUR (A) 11/21/2021 1035    TEST NOT REPORTED DUE TO COLOR INTERFERENCE OF URINE PIGMENT   PROTEINUR (A) 11/21/2021 1035    TEST NOT REPORTED DUE TO COLOR INTERFERENCE OF URINE PIGMENT   NITRITE (A) 11/21/2021 1035    TEST NOT REPORTED DUE TO COLOR INTERFERENCE OF URINE PIGMENT   LEUKOCYTESUR (A) 11/21/2021 1035    TEST NOT REPORTED DUE TO COLOR INTERFERENCE OF URINE PIGMENT   LEUKOCYTESUR Negative 09/08/2013 0133   Sepsis Labs Recent Labs  Lab 12/01/21 0814 12/02/21 0420 12/03/21 0445 12/05/21 0905  WBC 6.6 6.1 5.9 6.3   Microbiology No results found for this or any previous visit (from the past 240 hour(s)). Imaging US Venous Img Lower Right (DVT Study)  Result Date: 11/30/2021 CLINICAL DATA:  Right lower extremity pain. EXAM: Right LOWER EXTREMITY VENOUS DOPPLER ULTRASOUND TECHNIQUE: Gray-scale sonography with graded compression, as well as color Doppler and duplex ultrasound were performed to evaluate the lower extremity deep venous systems from the level of the common femoral vein and including the common femoral, femoral, profunda femoral, popliteal and calf  veins including the posterior tibial, peroneal and gastrocnemius veins when visible. The superficial great saphenous vein was also interrogated. Spectral Doppler was utilized to evaluate flow at rest and with distal augmentation maneuvers in the common femoral, femoral and popliteal veins. COMPARISON:  None Available. FINDINGS: Contralateral Common Femoral Vein: Respiratory phasicity is normal and symmetric with the symptomatic side. No evidence of thrombus. Normal compressibility. Common Femoral Vein: No evidence of thrombus. Normal compressibility, respiratory phasicity and response to augmentation. Saphenofemoral Junction: Nonocclusive thrombus is noted. Profunda Femoral Vein: No evidence of thrombus. Normal compressibility and flow on color Doppler imaging. Femoral Vein: No evidence of thrombus. Normal compressibility, respiratory phasicity and response to augmentation. Popliteal Vein: No evidence of thrombus. Normal compressibility, respiratory phasicity and response to augmentation. Calf Veins: Occlusive thrombus in the peroneal vein. Superficial Great Saphenous Vein: Occlusive thrombus. Venous Reflux:  None. Other Findings:  None. IMPRESSION: 1. Occlusive thrombus in the greater saphenous vein and peroneal vein. 2. Nonocclusive thrombus at the saphenofemoral junction. These results will be called to the ordering clinician or representative by the Radiologist Assistant, and communication documented in the PACS or [employer]. Electronically Signed   By: Marijo Sanes M.D.   On: 11/30/2021 13:09   DG Chest 2 View  Result Date: 11/30/2021 CLINICAL DATA:  Chest pain. EXAM: CHEST - 2 VIEW COMPARISON:  November 22, 2021. FINDINGS: The heart size and mediastinal contours are within normal limits. Left internal jugular dialysis catheter is unchanged in position. Both  lungs are clear. The visualized skeletal structures are unremarkable. IMPRESSION: No active cardiopulmonary disease. Aortic Atherosclerosis  (ICD10-I70.0). Electronically Signed   By: Marijo Conception M.D.   On: 11/30/2021 10:01      Time coordinating discharge: over 30 minutes  SIGNED:  Emeterio Reeve DO [facility]

## 2021-12-06 NOTE — Interval H&P Note (Signed)
History and Physical Interval Note:  Persistent bladder per urethra.  Noncontrast CT abdomen/pelvis yesterday showed no bladder distention or LUTS.  For cystoscopy; possible bladder biopsy, TURBT.  All questions were answered.  CV: RRR Lungs: Clear  12/06/2021 2:00 PM  Teresa Cook  has presented today for surgery, with the diagnosis of Hematuria.  The various methods of treatment have been discussed with the patient and family. After consideration of risks, benefits and other options for treatment, the patient has consented to  Procedure(s): CYSTOSCOPY (N/A) CYSTOSCOPY WITH BLADDER BIOPSY (N/A) TRANSURETHRAL RESECTION OF BLADDER TUMOR (TURBT) (N/A) as a surgical intervention.  The patient's history has been reviewed, patient examined, no change in status, stable for surgery.  I have reviewed the patient's chart and labs.  Questions were answered to the patient's satisfaction.     [place]

## 2021-12-06 NOTE — Anesthesia Procedure Notes (Signed)
Procedure Name: LMA Insertion Date/Time: 12/06/2021 2:21 PM  Performed by: Hedda Slade, CRNAPre-anesthesia Checklist: Patient identified, Patient being monitored, Timeout performed, Emergency Drugs available and Suction available Patient Re-evaluated:Patient Re-evaluated prior to induction Oxygen Delivery Method: Circle system utilized Preoxygenation: Pre-oxygenation with 100% oxygen Induction Type: IV induction Ventilation: Mask ventilation without difficulty LMA: LMA inserted LMA Size: 4.0 Tube type: Oral Number of attempts: 1 Placement Confirmation: positive ETCO2 and breath sounds checked- equal and bilateral Tube secured with: Tape Dental Injury: Teeth and Oropharynx as per pre-operative assessment

## 2021-12-06 NOTE — Op Note (Signed)
Preoperative diagnosis:  Urethral/bladder bleeding  Postoperative diagnosis:  History prior GU bleeding without active bleeding  Procedure: Cystoscopy Bilateral ureteroscopy  Surgeon: Abbie Sons, MD  Anesthesia: General  Complications: None  Intraoperative findings:  Bladder mucosa without erythema, solid or papillary lesions.  No active bleeding noted.  UOs normal-appearing bilaterally Right ureteroscopy-distal, mid and lower proximal ureter without evidence of blood, lesions or old clot Left ureteroscopy-distal, mid and lower proximal ureter without evidence of blood, lesions or old clot  EBL: None  Specimens: None  Indication: Teresa Cook is a 66 y.o. with ESRD on hemodialysis for the past several years.  Admitted last month with spontaneous hemorrhage right renal artery with retroperitoneal hematoma managed with embolization of the right kidney.  After this procedure she developed urethral bleeding and passage of blood per urethra.  She has been anuric since starting hemodialysis.  After reviewing the management options for treatment, he elected to proceed with the above surgical procedure(s). We have discussed the potential benefits and risks of the procedure, side effects of the proposed treatment, the likelihood of the patient achieving the goals of the procedure, and any potential problems that might occur during the procedure or recuperation. Informed consent has been obtained.  Description of procedure:  The patient was taken to the operating room and general anesthesia was induced.  The patient was placed in the dorsal lithotomy position, prepped and draped in the usual sterile fashion, and preoperative antibiotics were administered. A preoperative time-out was performed.   A 21 French cystoscope sheath with obturator was lubricated and passed per urethra.  After the obturator was removed a small amount of dark, blackish fluid was obtained consistent with old blood.   Estimated volume was less than 10 mL.  The bladder was then filled and drained with a few small old clots obtained and effluent was clear after 1 fill.  Thorough cystoscopy was then performed and no abnormalities or evidence of active bleeding identified.  The UOs were normal-appearing and no efflux was noted.  The cystoscope was removed and a 4.5 French semirigid ureteroscope was passed per urethra.  The right ureteral orifice was easily entered with the ureteroscope and advanced the lower proximal ureter with findings as described above.  The left UO was slightly smaller and the ureteroscope was advanced over guidewire with findings as described above.  The bladder was then emptied with the cystoscope sheath.  After anesthetic reversal she was transported to the PACU in stable condition.  Recommendation: Preoperative findings consistent with old blood most likely secondary to prior embolization of the right kidney Okay for discharge from urologic standpoint Urology follow-up prn   Abbie Sons, M.D.

## 2021-12-06 NOTE — Plan of Care (Signed)

## 2021-12-06 NOTE — Transfer of Care (Signed)
Immediate Anesthesia Transfer of Care Note  Patient: Teresa Cook  Procedure(s) Performed: CYSTOSCOPY URETEROSCOPY (Bilateral)  Patient Location: PACU  Anesthesia Type:General  Level of Consciousness: awake, alert , and oriented  Airway & Oxygen Therapy: Patient Spontanous Breathing  Post-op Assessment: Report given to RN and Post -op Vital signs reviewed and stable  Post vital signs: Reviewed and stable  Last Vitals:  Vitals Value Taken Time  BP 132/76 12/06/21 1455  Temp 36.7 C 12/06/21 1455  Pulse 73 12/06/21 1459  Resp 15 12/06/21 1459  SpO2 100 % 12/06/21 1459  Vitals shown include unvalidated device data.  Last Pain:  Vitals:   12/06/21 1100  TempSrc:   PainSc: 0-No pain         Complications: No notable events documented.

## 2021-12-07 NOTE — Anesthesia Postprocedure Evaluation (Signed)
Anesthesia Post Note  Patient: Kamariyah Timberlake  Procedure(s) Performed: CYSTOSCOPY URETEROSCOPY (Bilateral)  Patient location during evaluation: PACU Anesthesia Type: General Level of consciousness: awake and alert Pain management: pain level controlled Vital Signs Assessment: post-procedure vital signs reviewed and stable Respiratory status: spontaneous breathing, nonlabored ventilation, respiratory function stable and patient connected to nasal cannula oxygen Cardiovascular status: blood pressure returned to baseline and stable Postop Assessment: no apparent nausea or vomiting Anesthetic complications: no   No notable events documented.   Last Vitals:  Vitals:   12/06/21 1600 12/06/21 1620  BP: 116/77 117/80  Pulse: (!) 107 71  Resp: 18 16  Temp:  36.8 C  SpO2: 99% 99%    Last Pain:  Vitals:   12/06/21 1546  TempSrc:   PainSc: 4                  Precious Haws Lilyanna Lunt

## 2022-01-08 ENCOUNTER — Other Ambulatory Visit (HOSPITAL_BASED_OUTPATIENT_CLINIC_OR_DEPARTMENT_OTHER): Payer: Self-pay | Admitting: Osteopathic Medicine

## 2022-02-26 ENCOUNTER — Telehealth (INDEPENDENT_AMBULATORY_CARE_PROVIDER_SITE_OTHER): Payer: Self-pay

## 2022-02-26 NOTE — Telephone Encounter (Signed)
Spoke with the patient and she is scheduled with Dr. Lucky Cowboy on 02/27/22 for a permcath exchange with [facility]:00 am [facility]. Pre-procedure instructions were discussed and patient stated she understood.

## 2022-02-27 ENCOUNTER — Encounter: Payer: Self-pay | Admitting: Vascular Surgery

## 2022-02-27 ENCOUNTER — Ambulatory Visit
Admission: RE | Admit: 2022-02-27 | Discharge: 2022-02-27 | Disposition: A | Discharge status: Home / Self Care | Payer: Medicare Other | Attending: Vascular Surgery | Admitting: Vascular Surgery

## 2022-02-27 ENCOUNTER — Encounter
Admission: RE | Disposition: A | Discharge status: Home / Self Care | Payer: Self-pay | Source: Home / Self Care | Attending: Vascular Surgery

## 2022-02-27 ENCOUNTER — Emergency Department
Admission: EM | Admit: 2022-02-27 | Discharge: 2022-02-27 | Disposition: A | Discharge status: Home / Self Care | Payer: Medicare Other | Source: Home / Self Care | Attending: Emergency Medicine | Admitting: Emergency Medicine

## 2022-02-27 ENCOUNTER — Emergency Department: Payer: Medicare Other

## 2022-02-27 ENCOUNTER — Other Ambulatory Visit: Payer: Self-pay

## 2022-02-27 DIAGNOSIS — I2510 Atherosclerotic heart disease of native coronary artery without angina pectoris: Secondary | ICD-10-CM

## 2022-02-27 DIAGNOSIS — E1122 Type 2 diabetes mellitus with diabetic chronic kidney disease: Secondary | ICD-10-CM | POA: Insufficient documentation

## 2022-02-27 DIAGNOSIS — N186 End stage renal disease: Secondary | ICD-10-CM | POA: Diagnosis not present

## 2022-02-27 DIAGNOSIS — I120 Hypertensive chronic kidney disease with stage 5 chronic kidney disease or end stage renal disease: Secondary | ICD-10-CM

## 2022-02-27 DIAGNOSIS — T8249XA Other complication of vascular dialysis catheter, initial encounter: Secondary | ICD-10-CM | POA: Diagnosis not present

## 2022-02-27 DIAGNOSIS — Z992 Dependence on renal dialysis: Secondary | ICD-10-CM | POA: Diagnosis not present

## 2022-02-27 DIAGNOSIS — Y841 Kidney dialysis as the cause of abnormal reaction of the patient, or of later complication, without mention of misadventure at the time of the procedure: Secondary | ICD-10-CM | POA: Diagnosis not present

## 2022-02-27 DIAGNOSIS — X58XXXS Exposure to other specified factors, sequela: Secondary | ICD-10-CM | POA: Insufficient documentation

## 2022-02-27 DIAGNOSIS — G8918 Other acute postprocedural pain: Secondary | ICD-10-CM | POA: Insufficient documentation

## 2022-02-27 DIAGNOSIS — S21101S Unspecified open wound of right front wall of thorax without penetration into thoracic cavity, sequela: Secondary | ICD-10-CM | POA: Insufficient documentation

## 2022-02-27 DIAGNOSIS — Z7901 Long term (current) use of anticoagulants: Secondary | ICD-10-CM | POA: Insufficient documentation

## 2022-02-27 DIAGNOSIS — I871 Compression of vein: Secondary | ICD-10-CM

## 2022-02-27 HISTORY — DX: Nausea with vomiting, unspecified: R11.2

## 2022-02-27 HISTORY — PX: DIALYSIS/PERMA CATHETER INSERTION: CATH118288

## 2022-02-27 LAB — CBC WITH DIFFERENTIAL/PLATELET
Abs Immature Granulocytes: 0.03 10*3/uL (ref 0.00–0.07)
Basophils Absolute: 0 10*3/uL (ref 0.0–0.1)
Basophils Relative: 0 %
Eosinophils Absolute: 0.1 10*3/uL (ref 0.0–0.5)
Eosinophils Relative: 1 %
HCT: 32.9 % — ABNORMAL LOW (ref 36.0–46.0)
Hemoglobin: 10.9 g/dL — ABNORMAL LOW (ref 12.0–15.0)
Immature Granulocytes: 0 %
Lymphocytes Relative: 11 %
Lymphs Abs: 0.8 10*3/uL (ref 0.7–4.0)
MCH: 28.3 pg (ref 26.0–34.0)
MCHC: 33.1 g/dL (ref 30.0–36.0)
MCV: 85.5 fL (ref 80.0–100.0)
Monocytes Absolute: 0.7 10*3/uL (ref 0.1–1.0)
Monocytes Relative: 10 %
Neutro Abs: 5.7 10*3/uL (ref 1.7–7.7)
Neutrophils Relative %: 78 %
Platelets: 143 10*3/uL — ABNORMAL LOW (ref 150–400)
RBC: 3.85 MIL/uL — ABNORMAL LOW (ref 3.87–5.11)
RDW: 17.8 % — ABNORMAL HIGH (ref 11.5–15.5)
WBC: 7.3 10*3/uL (ref 4.0–10.5)
nRBC: 0 % (ref 0.0–0.2)

## 2022-02-27 LAB — BASIC METABOLIC PANEL
Anion gap: 16 — ABNORMAL HIGH (ref 5–15)
BUN: 77 mg/dL — ABNORMAL HIGH (ref 8–23)
CO2: 18 mmol/L — ABNORMAL LOW (ref 22–32)
Calcium: 9.1 mg/dL (ref 8.9–10.3)
Chloride: 103 mmol/L (ref 98–111)
Creatinine, Ser: 11.3 mg/dL — ABNORMAL HIGH (ref 0.44–1.00)
GFR, Estimated: 3 mL/min — ABNORMAL LOW (ref >60)
Glucose, Bld: 113 mg/dL — ABNORMAL HIGH (ref 70–99)
Potassium: 4.4 mmol/L (ref 3.5–5.1)
Sodium: 137 mmol/L (ref 135–145)

## 2022-02-27 LAB — POTASSIUM (ARMC VASCULAR LAB ONLY): Potassium (ARMC vascular lab): 4.9 mmol/L (ref 3.5–5.1)

## 2022-02-27 SURGERY — DIALYSIS/PERMA CATHETER INSERTION
Anesthesia: Moderate Sedation

## 2022-02-27 MED ORDER — METHYLPREDNISOLONE SODIUM SUCC 125 MG IJ SOLR: 125.0000 mg | Freq: Once | INTRAMUSCULAR | Status: DC | PRN

## 2022-02-27 MED ORDER — MIDAZOLAM HCL 2 MG/2ML IJ SOLN
INTRAMUSCULAR | Status: DC | PRN
  Administered 2022-02-27 (×3): 1 mg via INTRAVENOUS
  Administered 2022-02-27: 2 mg via INTRAVENOUS

## 2022-02-27 MED ORDER — ONDANSETRON HCL 4 MG/2ML IJ SOLN: 4.0000 mg | Freq: Four times a day (QID) | INTRAMUSCULAR | Status: DC | PRN

## 2022-02-27 MED ORDER — MIDAZOLAM HCL 5 MG/5ML IJ SOLN
INTRAMUSCULAR | Status: AC
  Filled 2022-02-27: qty 5

## 2022-02-27 MED ORDER — HYDROMORPHONE HCL 1 MG/ML IJ SOLN: 1.0000 mg | Freq: Once | INTRAMUSCULAR | Status: DC | PRN

## 2022-02-27 MED ORDER — SODIUM CHLORIDE 0.9 % IV SOLN: INTRAVENOUS | Status: DC

## 2022-02-27 MED ORDER — TRAMADOL HCL 50 MG PO TABS
50.0000 mg | ORAL_TABLET | Freq: Once | ORAL | Status: AC
  Administered 2022-02-27: 50 mg via ORAL
  Filled 2022-02-27: qty 1

## 2022-02-27 MED ORDER — FENTANYL CITRATE PF 50 MCG/ML IJ SOSY
PREFILLED_SYRINGE | INTRAMUSCULAR | Status: AC
  Filled 2022-02-27: qty 2

## 2022-02-27 MED ORDER — MIDAZOLAM HCL 2 MG/2ML IJ SOLN
INTRAMUSCULAR | Status: AC
  Filled 2022-02-27: qty 2

## 2022-02-27 MED ORDER — TRAMADOL HCL 50 MG PO TABS: 50.0000 mg | ORAL_TABLET | Freq: Four times a day (QID) | ORAL | 0 refills | Status: AC | PRN

## 2022-02-27 MED ORDER — FENTANYL CITRATE (PF) 100 MCG/2ML IJ SOLN
INTRAMUSCULAR | Status: DC | PRN
  Administered 2022-02-27: 25 ug via INTRAVENOUS
  Administered 2022-02-27: 50 ug via INTRAVENOUS
  Administered 2022-02-27 (×2): 25 ug via INTRAVENOUS

## 2022-02-27 MED ORDER — DIPHENHYDRAMINE HCL 50 MG/ML IJ SOLN: 50.0000 mg | Freq: Once | INTRAMUSCULAR | Status: DC | PRN

## 2022-02-27 MED ORDER — CEFAZOLIN SODIUM-DEXTROSE 1-4 GM/50ML-% IV SOLN
INTRAVENOUS | Status: AC
  Filled 2022-02-27: qty 50

## 2022-02-27 MED ORDER — FENTANYL CITRATE PF 50 MCG/ML IJ SOSY
50.0000 ug | PREFILLED_SYRINGE | Freq: Once | INTRAMUSCULAR | Status: AC
  Administered 2022-02-27: 50 ug via INTRAVENOUS
  Filled 2022-02-27: qty 1

## 2022-02-27 MED ORDER — CEFAZOLIN SODIUM-DEXTROSE 1-4 GM/50ML-% IV SOLN: 1.0000 g | INTRAVENOUS | Status: DC

## 2022-02-27 MED ORDER — CEFAZOLIN SODIUM-DEXTROSE 1-4 GM/50ML-% IV SOLN
INTRAVENOUS | Status: DC | PRN
  Administered 2022-02-27: 1 g via INTRAVENOUS

## 2022-02-27 MED ORDER — FENTANYL CITRATE (PF) 100 MCG/2ML IJ SOLN
INTRAMUSCULAR | Status: AC
  Filled 2022-02-27: qty 2

## 2022-02-27 MED ORDER — HEPARIN SODIUM (PORCINE) 10000 UNIT/ML IJ SOLN
INTRAMUSCULAR | Status: AC
  Filled 2022-02-27: qty 1

## 2022-02-27 MED ORDER — FAMOTIDINE 20 MG PO TABS: 40.0000 mg | ORAL_TABLET | Freq: Once | ORAL | Status: DC | PRN

## 2022-02-27 MED ORDER — MIDAZOLAM HCL 2 MG/ML PO SYRP: 8.0000 mg | ORAL_SOLUTION | Freq: Once | ORAL | Status: DC | PRN

## 2022-02-27 SURGICAL SUPPLY — 14 items
ADH SKN CLS APL DERMABOND .7 (GAUZE/BANDAGES/DRESSINGS) ×1
BIOPATCH RED 1 DISK 7.0 (GAUZE/BANDAGES/DRESSINGS) IMPLANT
CATH BEACON 5 .035 40 KMP TP (CATHETERS) IMPLANT
CATH BEACON 5 .038 40 KMP TP (CATHETERS) ×1
CATH PALINDROME-P 44CM KIT (CATHETERS) ×1
CATH PALINDROME-SP 14.5FX23 (CATHETERS) IMPLANT
DERMABOND ADVANCED .7 DNX12 (GAUZE/BANDAGES/DRESSINGS) IMPLANT
GLIDEWIRE STIFF .35X180X3 HYDR (WIRE) IMPLANT
GUIDEWIRE SUPER STIFF .035X180 (WIRE) IMPLANT
KIT CATH CHRNC PALINDROME PRCS (CATHETERS) IMPLANT
PACK ANGIOGRAPHY (CUSTOM PROCEDURE TRAY) IMPLANT
SHEATH PINNACLE 11FRX10 (SHEATH) IMPLANT
SUT MNCRL AB 4-0 PS2 18 (SUTURE) IMPLANT
SUT PROLENE 0 CT 1 30 (SUTURE) IMPLANT

## 2022-02-27 NOTE — Op Note (Signed)
OPERATIVE NOTE    PRE-OPERATIVE DIAGNOSIS: 1. ESRD 2.  Poorly functioning left jugular PermCath  POST-OPERATIVE DIAGNOSIS: same as above  PROCEDURE: Removal of left jugular PermCath Ultrasound guidance for vascular access to the right femoral  vein Fluoroscopic guidance for placement of catheter Placement of a 44 cm tip to cuff tunneled hemodialysis catheter via the right femoral vein  SURGEON: Leotis Pain, MD  ANESTHESIA:  Local with moderate conscious sedation for approximately 40 minutes using 5 mg of Versed and 125 mcg of Fentanyl  ESTIMATED BLOOD LOSS: 10 cc  FINDING(S): 1.  Patent right femoral vein  2.  Replacement of catheter in the left jugular vein was not possible due to occlusion of the left jugular and innominate veins.  Right internal jugular vein was small and did not appear to be usable for new catheter  SPECIMEN(S):  None  INDICATIONS:   Patient is a 67 y.o.female who presents with a nonfunctional left jugular PermCath which had been in for 8 years as the patient had never allowed extremity access.   The patient needs long term dialysis access for their ESRD, and a Permcath is necessary.  Risks and benefits are discussed and informed consent is obtained.    DESCRIPTION: After obtaining full informed written consent, the patient was brought back to the vascular suited. Moderate conscious sedation was administered throughout the procedure with a face-to-face encounter with my presence for the entire procedure and with my supervision of the RN monitoring the patient's vital signs, pulse oximetry, telemetry, and mental status throughout the procedure.  I began the procedure by placing an Amplatz Super Stiff wire through the existing PermCath.  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  With the wire parked into the IVC, I dissected out the existing PermCath and excised the existing cuff.  Even after the cuff  was removed, this was very tight and there was a thick fibrous sheath around the catheter on removal.  Attempts to place a new catheter or a new sheath over the Amplatz Super Stiff wire were unsuccessful due to no residual lumen of the left jugular vein or innominate vein from the catheter being in for so long.  This area was quickly abandoned and the wire was removed.  The right jugular vein was visualized with ultrasound and was small and not usable for PermCath placement. The patient's right groin was sterilely prepped and draped and a sterile surgical field was created.  The right femoral vein was visualized with ultrasound and found to be patent. It was then accessed under direct ultrasound guidance and a permanent image was recorded. A wire was placed. After skin nick and dilatation, the peel-away sheath was placed over the wire. I then turned my attention to an area about 4-5 cm inferior and lateral to the access incision and a small counterincision was created.  I tunneled from the counter  incision to the access site. Using fluoroscopic guidance, a 31 centimeterer tip to cuff tunneled hemodialysis catheter was selected, and tunneled from the counter  incision to the access site. It was then placed through the peel-away sheath and the peel-away sheath was removed. Using fluoroscopic guidance the catheter tips were parked in the right atrium. The appropriate distal connectors were placed. It withdrew blood well and flushed easily with heparinized saline and a concentrated heparin solution was then placed. It was secured to the leg  with 2 Prolene sutures. The access  incision was closed single 4-0 Monocryl. A 4-0 Monocryl pursestring suture was placed around the exit site. Sterile dressings were placed. The patient tolerated the procedure well and was taken to the recovery room in stable condition.  COMPLICATIONS: None  CONDITION: Stable    Leotis Pain 02/27/2022 12:27 PM   This note was created  with Dragon Medical transcription system. Any errors in dictation are purely unintentional.

## 2022-02-27 NOTE — Discharge Instructions (Signed)
Perm Cath Insertion, Care After Refer to this sheet in the next few weeks. These instructions provide you with information about caring for yourself after your procedure. Your health care provider may also give you more specific instructions. Your treatment has been planned according to current medical practices, but problems sometimes occur. Call your health care provider if you have any problems or questions after your procedure. What can I expect after the procedure? After the procedure, it is common to have: Some mild redness, swelling, and pain around your catheter site.   Follow these instructions at home: Incision care  Check your insertion site  every day for signs of infection. Check for: More redness, swelling, or pain. More fluid or blood. Warmth. Pus or a bad smell. Remove your dressing in 48hrs leave open to air  Activity  Return to your normal activities as told by your health care provider. Ask your health care provider what activities are safe for you. Do not lift anything that is heavier than 10 lb (4.5 kg) for 3 days  You may shower tomorrow  Contact a health care provider if: You have more fluid or blood coming from your removal site You have more redness, swelling, or pain at your incisions or around the area where your catheter was removed Your removal site feel warm to the touch. You feel unusually weak. You feel nauseous.. Get help right away if You have swelling in your arm, shoulder, neck, or face. You develop chest pain. You have difficulty breathing. You feel dizzy or light-headed. You have pus or a bad smell coming from your removal site You have a fever. You develop bleeding from your removal site, and your bleeding does not stop. This information is not intended to replace advice given to you by your health care provider. Make sure you discuss any questions you have with your health care provider. Document Released: 12/11/2011 Document Revised:  08/27/2015 Document Reviewed: 09/19/2014 Elsevier Interactive Patient Education  2017 [employer]Refer to this sheet in the next few weeks. These instructions provide you with information about caring for yourself after your procedure. Your health care provider may also give you more specific instructions. Your treatment has been planned according to current medical practices, but problems sometimes occur. Call your health care provider if you have any problems or questions after your procedure. What can I expect after the procedure? After the procedure, it is common to have: Some mild redness, swelling, and pain around your catheter site.   Follow these instructions at home: Incision care  Check your removal site  every day for signs of infection. Check for: More redness, swelling, or pain. More fluid or blood. Warmth. Pus or a bad smell. Remove your dressing in 48hrs leave open to air  Activity  Return to your normal activities as told by your health care provider. Ask your health care provider what activities are safe for you. Do not lift anything that is heavier than 10 lb (4.5 kg) for 3 days  You may shower tomorrow  Contact a health care provider if: You have more fluid or blood coming from your removal site You have more redness, swelling, or pain at your incisions or around the area where your catheter was removed Your removal site feel warm to the touch. You feel unusually weak. You feel nauseous.. Get help right away if You have swelling in your arm, shoulder, neck, or face. You develop chest pain. You have difficulty breathing. You feel dizzy or light-headed.  You have pus or a bad smell coming from your removal site You have a fever. You develop bleeding from your removal site, and your bleeding does not stop. This information is not intended to replace advice given to you by your health care provider. Make sure you discuss any questions you have with your health care  provider. Document Released: 12/11/2011 Document Revised: 08/27/2015 Document Reviewed: 09/19/2014 Elsevier Interactive Patient Education  2017 [employer].

## 2022-02-27 NOTE — Discharge Instructions (Addendum)
As we discussed the pain and swelling you have is likely inflammation of the existing blood clot and vessels with the removal of the catheter. This should improve over the next couple of days. Please return for any significant bleeding, high fevers, shortness of breath or any other new or concerning symptoms.

## 2022-02-27 NOTE — Progress Notes (Signed)
Dr. Lucky Cowboy came by bedside & spoke with pt. & her granddaughter re: placement of new perm cath. Both verbalized

## 2022-02-27 NOTE — ED Provider Notes (Signed)
[facility] Provider Note    Event Date/Time   First MD Initiated Contact with Patient 02/27/22 1832     (approximate)   History   Bleeding  HPI  Teresa Cook is a 67 y.o. female who presents to the emergency department today because of concerns for bleeding from previous dialysis catheter site in left upper chest.  Patient states that she had this catheter removed today.  After getting home she then noticed that her shirt was soaked with blood.  Since then however the bleeding has stopped.  In addition the patient has had pain to the left side of her neck.  She has also noticed some swelling to the area.  This started after removal of the catheter.     Physical Exam   Triage Vital Signs: ED Triage Vitals  Enc Vitals Group     BP 02/27/22 1830 (!) 151/86     Pulse Rate 02/27/22 1830 88     Resp 02/27/22 1832 18     Temp 02/27/22 1832 98 F (36.7 C)     Temp Source 02/27/22 1832 Oral     SpO2 02/27/22 1830 98 %     Weight 02/27/22 1833 114 lb (51.7 kg)     Height 02/27/22 1833 5' 5"$  (1.651 m)     Head Circumference --      Peak Flow --      Pain Score 02/27/22 1832 8     Pain Loc --      Pain Edu? --      Excl. in [place]? --     Most recent vital signs: Vitals:   02/27/22 1930 02/27/22 1945  BP: 134/85   Pulse: 87 91  Resp:    Temp:    SpO2: 100% 100%   General: Awake, alert, oriented. CV:  Good peripheral perfusion. Regular rate and rhythm. Resp:  Normal effort. Lungs clear Abd:  No distention.  Other:  No active bleeding from site in left upper chest. Some swelling to left side of neck, tender to palpation.   ED Results / Procedures / Treatments   Labs (all labs ordered are listed, but only abnormal results are displayed) Labs Reviewed  BASIC METABOLIC PANEL - Abnormal; Notable for the following components:      Result Value   CO2 18 (*)    Glucose, Bld 113 (*)    BUN 77 (*)    Creatinine, Ser 11.30 (*)    GFR, Estimated 3  (*)    Anion gap 16 (*)    All other components within normal limits  CBC WITH DIFFERENTIAL/PLATELET - Abnormal; Notable for the following components:   RBC 3.85 (*)    Hemoglobin 10.9 (*)    HCT 32.9 (*)    RDW 17.8 (*)    Platelets 143 (*)    All other components within normal limits     EKG  None   RADIOLOGY US neck IMPRESSION:  1. There is a heterogeneously hypoechoic solid elongate tubular  structure in the area of swelling below the left clavicle, most  likely a thrombosed left subclavian vein. There is suggestion of a  tract from the removed catheter within this on some images but it  does not opacify with contrast.  2. The technologist also questions the presence of thrombus in the  cephalic vein but has not submitted any images of this.  3. No mass or fluid collection is seen in the area of swelling below  the left clavicle.    PROCEDURES:  Critical Care performed: No  Procedures   MEDICATIONS ORDERED IN ED: Medications - No data to display   IMPRESSION / MDM / [place] / ED COURSE  I reviewed the triage vital signs and the nursing notes.                              Differential diagnosis includes, but is not limited to, wound bleeding, clot, hematoma.  Patient's presentation is most consistent with acute presentation with potential threat to life or bodily function.   Patient presented to the emergency department today because of concerns for bleeding from the site of recently removed dialysis catheter as well as some swelling and pain to the left side of her neck.  At the time my exam patient had no active bleeding.  Blood work was checked without any significant anemia.  Patient also states that she missed her dialysis today however potassium is within normal limits.  Ultrasound was performed to the area of swelling is concerning for possible subclavian vein clot.  I discussed with Dr. Lucky Cowboy with vascular surgery.  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  He believes that the acute swelling and pain is likely due to inflammation from them removing the catheter and pulling and tugging on the clot and walls of the vessel. I discussed this with the patient. Will plan on discharging. Discussed return precautions.   FINAL CLINICAL IMPRESSION(S) / ED DIAGNOSES   Final diagnoses:  Post-operative pain  Bleeding from open wound of chest wall, right, sequela    Note:  This document was prepared using Dragon voice recognition software and may include unintentional dictation errors.    Nance Pear, MD 02/27/22 [id number]

## 2022-02-27 NOTE — H&P (Signed)
[facility] Admission History & Physical  MRN : [id number]  Teresa Cook is a 67 y.o. (1955-09-10) female who presents with chief complaint of No chief complaint on file. Marland Kitchen  History of Present Illness:  I am asked to evaluate the patient by the dialysis center. The patient was sent here because they were unable to achieve adequate dialysis yesterday. Furthermore the Center states they were unable to aspirate either lumen of the catheter yesterday.  This problem has been getting worse for about 1 week. The patient is unaware of any other change.   Patient denies pain or tenderness overlying the access.  There is no pain with dialysis.  Patient denies fevers or shaking chills while on dialysis.    There have multiple any past interventions and declots of his multiple different access.  The patient is not chronically hypotensive on dialysis.   Current Facility-Administered Medications  Medication Dose Route Frequency Provider Last Rate Last Admin   0.9 %  sodium chloride infusion   Intravenous Continuous Kris Hartmann, NP       ceFAZolin (ANCEF) 1-4 GM/50ML-% IVPB            ceFAZolin (ANCEF) IVPB 1 g/50 mL premix  1 g Intravenous 30 min Pre-Op Kris Hartmann, NP       diphenhydrAMINE (BENADRYL) injection 50 mg  50 mg Intravenous Once PRN Kris Hartmann, NP       famotidine (PEPCID) tablet 40 mg  40 mg Oral Once PRN Kris Hartmann, NP       fentaNYL (SUBLIMAZE) 100 MCG/2ML injection            fentaNYL (SUBLIMAZE) injection    PRN Algernon Huxley, MD   50 mcg at 02/27/22 1143   heparin 10000 UNIT/ML injection            HYDROmorphone (DILAUDID) injection 1 mg  1 mg Intravenous Once PRN Kris Hartmann, NP       methylPREDNISolone sodium succinate (SOLU-MEDROL) 125 mg/2 mL injection 125 mg  125 mg Intravenous Once PRN Kris Hartmann, NP       midazolam (VERSED) 2 MG/ML syrup 8 mg  8 mg Oral Once PRN Kris Hartmann, NP       midazolam (VERSED) 5 MG/5ML injection             ondansetron (ZOFRAN) injection 4 mg  4 mg Intravenous Q6H PRN Kris Hartmann, NP         Past Surgical History:  Procedure Laterality Date   ABDOMINAL HYSTERECTOMY  1980   APPENDECTOMY     BREAST BIOPSY Left 10/28/2013   benign   BREAST EXCISIONAL BIOPSY Left 2002   benign   CORONARY ANGIOGRAPHY N/A 04/04/2020   Procedure: CORONARY ANGIOGRAPHY;  Surgeon: Wellington Hampshire, MD;  Location: [facility];  Service: Cardiovascular;  Laterality: N/A;   CYSTOSCOPY N/A 12/06/2021   Procedure: CYSTOSCOPY;  Surgeon: Abbie Sons, MD;  Location: [facility];  Service: Urology;  Laterality: N/A;   INSERTION OF DIALYSIS CATHETER  2014   LIPOMA EXCISION N/A 01/23/2016   Procedure: EXCISION LIPOMA;  Surgeon: Hubbard Robinson, MD;  Location: [facility];  Service: General;  Laterality: N/A;   MASTECTOMY W/ SENTINEL NODE BIOPSY Bilateral 01/23/2016   Procedure: bilateral MASTECTOMY WITH  bilateral SENTINEL LYMPH NODE BIOPSY possible left axillary node dissection forehead lipoma removal;  Surgeon: Hubbard Robinson, MD;  Location: [facility];  Service: General;  Laterality: Bilateral;   PARTIAL HYSTERECTOMY     PERIPHERAL VASCULAR CATHETERIZATION N/A 12/25/2015   Procedure: Dialysis/Perma Catheter Insertion;  Surgeon: Algernon Huxley, MD;  Location: [facility];  Service: Cardiovascular;  Laterality: N/A;   PERIPHERAL VASCULAR CATHETERIZATION Left 01/22/2016   Procedure: Dialysis/Perma Catheter Insertion;  Surgeon: Algernon Huxley, MD;  Location: [facility];  Service: Cardiovascular;  Laterality: Left;   PERIPHERAL VASCULAR CATHETERIZATION N/A 01/26/2016   Procedure: Dialysis/Perma Catheter Insertion;  Surgeon: Katha Cabal, MD;  Location: [facility];  Service: Cardiovascular;  Laterality: N/A;   PORT-A-CATH REMOVAL N/A 12/20/2015   Procedure: REMOVAL PORT-A-CATH;  Surgeon: Hubbard Robinson, MD;  Location: [facility];  Service: General;  Laterality: N/A;  left      PORTACATH PLACEMENT Left 08/21/2015   Procedure: INSERTION PORT-A-CATH;  Surgeon: Hubbard Robinson, MD;  Location: [facility];  Service: General;  Laterality: Left;   REMOVAL OF A DIALYSIS CATHETER  2017   RENAL ANGIOGRAPHY Right 11/05/2021   Procedure: RENAL ANGIOGRAPHY;  Surgeon: Algernon Huxley, MD;  Location: [place] [facility];  Service: Cardiovascular;  Laterality: Right;  with embolization   RIGHT HEART CATH N/A 04/04/2020   Procedure: RIGHT HEART CATH;  Surgeon: Wellington Hampshire, MD;  Location: [place] [facility];  Service: Cardiovascular;  Laterality: N/A;   URETEROSCOPY Bilateral 12/06/2021   Procedure: URETEROSCOPY;  Surgeon: Abbie Sons, MD;  Location: [facility];  Service: Urology;  Laterality: Bilateral;     Social History   Tobacco Use   Smoking status: Never   Smokeless tobacco: Never  Vaping Use   Vaping Use: Never used  Substance Use Topics   Alcohol use: No    Alcohol/week: 0.0 standard drinks of alcohol   Drug use: No     Family History  Problem Relation Age of Onset   Stroke Mother    CVA Mother    Hypertension Mother    Hypertension Father    Hypertension Sister    Diabetes Sister    Cancer Sister 37       Breast   Stroke Brother    Hypertension Brother    Breast cancer Maternal Aunt 70    No family history of bleeding or clotting disorders, autoimmune disease or porphyria  Allergies  Allergen Reactions   Gabapentin Other (See Comments)    Seizure   Adhesive [Tape] Itching    Silk tape is ok to use.     REVIEW OF SYSTEMS (Negative unless checked)  Constitutional: []$ Weight loss  []$ Fever  []$ Chills Cardiac: []$ Chest pain   []$ Chest pressure   []$ Palpitations   []$ Shortness of breath when laying flat   []$ Shortness of breath at rest   [x]$ Shortness of breath with exertion. Vascular:  []$ Pain in legs with walking   []$ Pain in legs at rest   []$ Pain in legs when laying flat   []$ Claudication   []$ Pain in feet when walking  []$ Pain in feet at rest  []$ Pain  in feet when laying flat   []$ History of DVT   []$ Phlebitis   []$ Swelling in legs   []$ Varicose veins   []$ Non-healing ulcers Pulmonary:   []$ Uses home oxygen   []$ Productive cough   []$ Hemoptysis   []$ Wheeze  []$ COPD   []$ Asthma Neurologic:  []$ Dizziness  []$ Blackouts   []$ Seizures   []$ History of stroke   []$ History of TIA  []$ Aphasia   []$ Temporary blindness   []$ Dysphagia   []$ Weakness or numbness in arms   []$ Weakness or  numbness in legs Musculoskeletal:  [x]$ Arthritis   []$ Joint swelling   [x]$ Joint pain   []$ Low back pain Hematologic:  []$ Easy bruising  []$ Easy bleeding   []$ Hypercoagulable state   [x]$ Anemic  []$ Hepatitis Gastrointestinal:  []$ Blood in stool   []$ Vomiting blood  []$ Gastroesophageal reflux/heartburn   []$ Difficulty swallowing. Genitourinary:  [x]$ Chronic kidney disease   []$ Difficult urination  []$ Frequent urination  []$ Burning with urination   []$ Blood in urine Skin:  []$ Rashes   []$ Ulcers   []$ Wounds Psychological:  []$ History of anxiety   []$  History of major depression.  Physical Examination  Vitals:   02/27/22 1021  BP: (!) 169/101  Pulse: 86  Resp: 18  Temp: 98.4 F (36.9 C)  TempSrc: Oral  SpO2: 96%  Weight: 51.7 kg  Height: 5' 5"$  (1.651 m)   Body mass index is 18.97 kg/m. Gen: WD/WN, NAD Head: /AT, No temporalis wasting.  Ear/Nose/Throat: Hearing grossly intact, nares w/o erythema or drainage, oropharynx w/o Erythema/Exudate,  Eyes: Conjunctiva clear, sclera non-icteric Neck: Trachea midline.  No JVD.  Pulmonary:  Good air movement, respirations not labored, no use of accessory muscles.  Cardiac: RRR, normal S1, S2. Vascular: left chest tunneled catheter without tenderness or drainage Vessel Right Left  Radial Palpable Palpable  Gastrointestinal: soft, non-tender/non-distended. No guarding/reflex.  Musculoskeletal: M/S 5/5 throughout.  Extremities without ischemic changes.  No deformity or atrophy.  Neurologic: Sensation grossly intact in extremities.  Symmetrical.  Speech is fluent.  Motor exam as listed above. Psychiatric: Judgment intact, Mood & affect appropriate for pt's clinical situation. Dermatologic: No rashes or ulcers noted.  No cellulitis or open wounds. Lymph : No Cervical, Axillary, or Inguinal lymphadenopathy.   CBC Lab Results  Component Value Date   WBC 6.3 12/05/2021   HGB 8.6 (L) 12/05/2021   HCT 27.1 (L) 12/05/2021   MCV 85.5 12/05/2021   PLT 145 (L) 12/05/2021    BMET    Component Value Date/Time   NA 134 (L) 12/06/2021 1223   NA 140 02/29/2020 1040   NA 138 02/03/2014 0135   K 4.3 12/06/2021 1223   K 4.0 02/03/2014 0135   CL 98 12/06/2021 1223   CL 102 02/03/2014 0135   CO2 28 12/06/2021 1223   CO2 23 02/03/2014 0135   GLUCOSE 94 12/06/2021 1223   GLUCOSE 101 (H) 02/03/2014 0135   BUN 23 12/06/2021 1223   BUN 40 (H) 02/29/2020 1040   BUN 21 (H) 02/03/2014 0135   CREATININE 6.23 (H) 12/06/2021 1223   CREATININE 5.11 (H) 02/03/2014 0135   CALCIUM 10.7 (H) 12/06/2021 1223   CALCIUM 8.6 02/03/2014 0135   GFRNONAA 7 (L) 12/06/2021 1223   GFRNONAA 9 (L) 02/03/2014 0135   GFRNONAA 6 (L) 09/08/2013 0133   GFRAA 5 (L) 02/29/2020 1040   GFRAA 11 (L) 02/03/2014 0135   GFRAA 7 (L) 09/08/2013 0133   CrCl cannot be calculated (Patient's most recent lab result is older than the maximum 21 days allowed.).  COAG Lab Results  Component Value Date   INR 1.3 (H) 11/30/2021   INR 1.4 (H) 11/13/2021   INR 1.3 (H) 11/10/2021    Radiology No results found.  Assessment/Plan 1.  Complication dialysis device with thrombosis AV access:  Patient's left tunneled catheter is thrombosed. The patient will undergo exchange of the catheter same venous access using interventional techniques.  The risks and benefits were described to the patient.  All questions were answered.  The patient agrees to proceed with intervention.  2.  End-stage renal disease requiring  hemodialysis:  Patient will continue dialysis therapy without further interruption if a  successful exchange is not achieved then new site will be found for tunneled catheter placement. Dialysis has already been arranged since the patient missed their previous session 3.  Hypertension:  Patient will continue medical management; nephrology is following no changes in oral medications. 4. Diabetes mellitus:  Glucose will be monitored and oral medications been held this morning once the patient has undergone the patient's procedure po intake will be reinitiated and again Accu-Cheks will be used to assess the blood glucose level and treat as needed. The patient will be restarted on the patient's usual hypoglycemic regime 5.  Coronary artery disease:  EKG will be monitored. Nitrates will be used if needed. The patient's oral cardiac medications will be continued.    Leotis Pain, MD  02/27/2022 11:43 AM

## 2022-02-27 NOTE — ED Triage Notes (Signed)
Pt. To ED via EMS for bleeding from left upper chest site where dialysis port was removed this morning. Pt states port re- placed in right groin. Pt. Is on eliquis, states did not take yesterday, did take today. Pt. States site is sore, and she feels a bit light- headed. Pt. Has dialysis M/W/F, was last dialyzed on Monday.

## 2022-02-27 NOTE — ED Notes (Signed)
US at bedside

## 2022-02-27 NOTE — ED Notes (Signed)
Pt rpting 9/10 pain at site of access on neck area, Provider Archie Balboa, MD notified

## 2022-02-28 ENCOUNTER — Encounter: Payer: Self-pay | Admitting: Vascular Surgery

## 2022-03-04 ENCOUNTER — Encounter: Payer: Self-pay | Admitting: Vascular Surgery

## 2023-03-25 ENCOUNTER — Emergency Department

## 2023-03-25 ENCOUNTER — Other Ambulatory Visit: Payer: Self-pay

## 2023-03-25 ENCOUNTER — Encounter: Payer: Self-pay | Admitting: Emergency Medicine

## 2023-03-25 ENCOUNTER — Emergency Department
Admission: EM | Admit: 2023-03-25 | Discharge: 2023-03-25 | Disposition: A | Discharge status: Home / Self Care | Attending: Emergency Medicine | Admitting: Emergency Medicine

## 2023-03-25 DIAGNOSIS — W07XXXA Fall from chair, initial encounter: Secondary | ICD-10-CM | POA: Diagnosis not present

## 2023-03-25 DIAGNOSIS — S0990XA Unspecified injury of head, initial encounter: Secondary | ICD-10-CM | POA: Insufficient documentation

## 2023-03-25 DIAGNOSIS — S7011XA Contusion of right thigh, initial encounter: Secondary | ICD-10-CM | POA: Diagnosis not present

## 2023-03-25 DIAGNOSIS — S7001XA Contusion of right hip, initial encounter: Secondary | ICD-10-CM | POA: Diagnosis not present

## 2023-03-25 DIAGNOSIS — D329 Benign neoplasm of meninges, unspecified: Secondary | ICD-10-CM

## 2023-03-25 DIAGNOSIS — Z7901 Long term (current) use of anticoagulants: Secondary | ICD-10-CM | POA: Insufficient documentation

## 2023-03-25 LAB — BASIC METABOLIC PANEL
Anion gap: 17 — ABNORMAL HIGH (ref 5–15)
BUN: 51 mg/dL — ABNORMAL HIGH (ref 8–23)
CO2: 25 mmol/L (ref 22–32)
Calcium: 10.5 mg/dL — ABNORMAL HIGH (ref 8.9–10.3)
Chloride: 95 mmol/L — ABNORMAL LOW (ref 98–111)
Creatinine, Ser: 8.29 mg/dL — ABNORMAL HIGH (ref 0.44–1.00)
GFR, Estimated: 5 mL/min — ABNORMAL LOW (ref >60)
Glucose, Bld: 109 mg/dL — ABNORMAL HIGH (ref 70–99)
Potassium: 3.2 mmol/L — ABNORMAL LOW (ref 3.5–5.1)
Sodium: 137 mmol/L (ref 135–145)

## 2023-03-25 LAB — CBC
HCT: 39.4 % (ref 36.0–46.0)
Hemoglobin: 13.5 g/dL (ref 12.0–15.0)
MCH: 28.6 pg (ref 26.0–34.0)
MCHC: 34.3 g/dL (ref 30.0–36.0)
MCV: 83.5 fL (ref 80.0–100.0)
Platelets: 165 10*3/uL (ref 150–400)
RBC: 4.72 MIL/uL (ref 3.87–5.11)
RDW: 17.2 % — ABNORMAL HIGH (ref 11.5–15.5)
WBC: 7.4 10*3/uL (ref 4.0–10.5)
nRBC: 0 % (ref 0.0–0.2)

## 2023-03-25 LAB — APTT: aPTT: 29 s (ref 24–36)

## 2023-03-25 LAB — PROTIME-INR
INR: 1.3 — ABNORMAL HIGH (ref 0.8–1.2)
Prothrombin Time: 16.2 s — ABNORMAL HIGH (ref 11.4–15.2)

## 2023-03-25 LAB — CBG MONITORING, ED: Glucose-Capillary: 113 mg/dL — ABNORMAL HIGH (ref 70–99)

## 2023-03-25 MED ORDER — ACETAMINOPHEN 500 MG PO TABS
1000.0000 mg | ORAL_TABLET | ORAL | Status: AC
  Administered 2023-03-25: 1000 mg via ORAL
  Filled 2023-03-25: qty 2

## 2023-03-25 NOTE — ED Notes (Signed)
 EDP at bedside

## 2023-03-25 NOTE — ED Notes (Addendum)
 Patient explained to RN that she does not produce urine anymore. Patient is a dialysis patient. Patient receives dialysis M,W,F schedule. Patient Last received dialysis 03/24/2023.

## 2023-03-25 NOTE — ED Provider Notes (Signed)
 [facility] Provider Note    Event Date/Time   First MD Initiated Contact with Patient 03/25/23 1057     (approximate)   History   Fall   HPI  Teresa Cook is a 68 y.o. female history of previous blood clot on anticoagulation.  She has a history of frequent falls.  She is here with her granddaughter.  Today she was getting up from a chair when she fell backward.  She struck her head she thinks sort of on the chair and also landed along her right buttock Area.  She reports mild headache in the back of her head no nausea or vomiting.  No change in mental status no confusion.  Acting behaving normally urinating normally.  She is in her normal health recently.  Does fall frequently.  She reports her right hip and buttock feel slightly sore     Physical Exam   Triage Vital Signs: ED Triage Vitals  Encounter Vitals Group     BP 03/25/23 1028 (!) 152/102     Systolic BP Percentile --      Diastolic BP Percentile --      Pulse Rate 03/25/23 1028 93     Resp 03/25/23 1028 17     Temp 03/25/23 1028 98.6 F (37 C)     Temp Source 03/25/23 1028 Oral     SpO2 03/25/23 1028 97 %     Weight 03/25/23 1029 124 lb (56.2 kg)     Height 03/25/23 1029 5\' 6"  (1.676 m)     Head Circumference --      Peak Flow --      Pain Score 03/25/23 1028 8     Pain Loc --      Pain Education --      Exclude from Growth Chart --     Most recent vital signs: Vitals:   03/25/23 1430 03/25/23 1437  BP: 114/70   Pulse: 84   Resp: (!) 21   Temp:  97.8 F (36.6 C)  SpO2: 95%      General: Awake, no distress.  Normocephalic atraumatic.  No noted areas of contusion or hematoma.  She is fully awake alert oriented reports slight soreness over the back of the occiput.  No midline cervical tenderness but reports that she has noticed for last 2 months that she will sometimes feel a "click" in her neck CV:  Good peripheral perfusion.  Normal tones and rate Resp:  Normal effort.   Abd:  No distention.  Other:  Lower extremities patient able to move her right hip flex and extend at the knee also at the hip as well as the ankle.  Strong warm well-perfused right lower extremity able to wiggle toes without difficulty.  Moves all extremities well.  She reports soreness primarily over the right superior lateral thigh.  No obvious contusion bleeding or lesion noted.  There is no shortening or rotation.  I think it is reassuring that she is able to run it through good range of motion reports soreness, there is no obvious fracture deformity.   ED Results / Procedures / Treatments   Labs (all labs ordered are listed, but only abnormal results are displayed) Labs Reviewed  BASIC METABOLIC PANEL - Abnormal; Notable for the following components:      Result Value   Potassium 3.2 (*)    Chloride 95 (*)    Glucose, Bld 109 (*)    BUN 51 (*)    Creatinine,  Ser 8.29 (*)    Calcium 10.5 (*)    GFR, Estimated 5 (*)    Anion gap 17 (*)    All other components within normal limits  CBC - Abnormal; Notable for the following components:   RDW 17.2 (*)    All other components within normal limits  PROTIME-INR - Abnormal; Notable for the following components:   Prothrombin Time 16.2 (*)    INR 1.3 (*)    All other components within normal limits  CBG MONITORING, ED - Abnormal; Notable for the following components:   Glucose-Capillary 113 (*)    All other components within normal limits  APTT  URINALYSIS, ROUTINE W REFLEX MICROSCOPIC   Labs reported normal CBC.  Metabolic panel shows mild hypokalemia potassium 3.2.  Creatinine 8.3 patient is on hemodialysis.  Right groin hemodialysis catheter in place no bleeding.  EKG  Interpreted by me at 1045 heart rate 90 QRS 110 QTc 470 Normal sinus rhythm, first-degree AV block.  LVH.  LVH/reports after generality appears a little more apparent today.  Reviewed old EKGs LVH changes are pre-existing.  Patient has no associated acute  cardiopulmonary symptoms no chest pain no dyspnea.  No findings that would suggest underlying ACS   RADIOLOGY  CT Cervical Spine Wo Contrast Result Date: 03/25/2023 CLINICAL DATA:  Trauma.  Fall. EXAM: CT CERVICAL SPINE WITHOUT CONTRAST TECHNIQUE: Multidetector CT imaging of the cervical spine was performed without intravenous contrast. Multiplanar CT image reconstructions were also generated. RADIATION DOSE REDUCTION: This exam was performed according to the departmental dose-optimization program which includes automated exposure control, adjustment of the mA and/or kV according to patient size and/or use of iterative reconstruction technique. COMPARISON:  Cervical spine CT dated 11/05/2021. FINDINGS: Alignment: No acute subluxation. Skull base and vertebrae: No acute fracture. There is osteopenia and findings suggestive of renal osteodystrophy. Soft tissues and spinal canal: No prevertebral fluid or swelling. No visible canal hematoma. Disc levels:  No acute findings.  Degenerative changes. Upper chest: Negative. Other: Bilateral carotid bulb calcified plaques. IMPRESSION: 1. No acute/traumatic cervical spine pathology. 2. Osteopenia and degenerative changes. Electronically Signed   By: Elgie Collard M.D.   On: 03/25/2023 14:58   DG Hip Unilat W or Wo Pelvis 2-3 Views Right Result Date: 03/25/2023 CLINICAL DATA:  Pain after fall EXAM: DG HIP (WITH OR WITHOUT PELVIS) 3V RIGHT COMPARISON:  None Available. FINDINGS: No fracture or dislocation. Preserved joint spaces. Osteopenia. With this level of osteopenia subtle nondisplaced injury can be difficult to completely exclude. Dish in evaluation as clinically appropriate. Hyperostosis. Large caliber right femoral catheter in place extends superior at the edge of the imaging field. Prominent vascular calcifications. IMPRESSION: Osteopenia.  Hyperostosis.  Right femoral catheter. Electronically Signed   By: Karen Kays M.D.   On: 03/25/2023 13:57   CT HEAD  WO CONTRAST Result Date: 03/25/2023 CLINICAL DATA:  Larey Seat with trauma to the head.  Anticoagulated. EXAM: CT HEAD WITHOUT CONTRAST TECHNIQUE: Contiguous axial images were obtained from the base of the skull through the vertex without intravenous contrast. RADIATION DOSE REDUCTION: This exam was performed according to the departmental dose-optimization program which includes automated exposure control, adjustment of the mA and/or kV according to patient size and/or use of iterative reconstruction technique. COMPARISON:  11/05/2021 FINDINGS: Brain: No sign of acute infarction or intracranial hemorrhage. Chronic cerebellar parenchymal calcification is unchanged. Meningioma projecting anteriorly from the under surface of the midline tentorium is unchanged since 2023 measuring 27 x 23 x 24 mm. This  indents the posterosuperior cerebellum but is not associated with any cerebellar edema. No compromise of the fourth ventricle. Cerebral hemispheres appear normal for age with mild volume loss but no evidence of old or acute focal infarction. No hydrocephalus or extra-axial collection. Vascular: There is atherosclerotic calcification of the major vessels at the base of the brain. Skull: Negative Sinuses/Orbits: Clear/normal Other: None IMPRESSION: 1. No acute or traumatic finding. 2. 27 x 23 x 24 mm meningioma projecting anteriorly from the under surface of the midline tentorium, unchanged since 2023. This indents the posterosuperior cerebellum but is not associated with any cerebellar edema. No compromise of the fourth ventricle. 3. Chronic cerebellar parenchymal calcification, unchanged. Electronically Signed   By: Paulina Fusi M.D.   On: 03/25/2023 11:17      PROCEDURES:  Critical Care performed: No  Procedures   MEDICATIONS ORDERED IN ED: Medications  acetaminophen (TYLENOL) tablet 1,000 mg (1,000 mg Oral Given 03/25/23 1157)   Hypokalemia is noted but in the setting of hemodialysis will not supplement with  potassium at this time  IMPRESSION / MDM / ASSESSMENT AND PLAN / ED COURSE  I reviewed the triage vital signs and the nursing notes.                              Differential diagnosis includes, but is not limited to, fall likely mechanical in nature she describes she was getting up from a chair stumbling backward and hitting her head.  There is no loss consciousness no injury except for some soreness over the right upper thigh.  Imaging of the right hip is negative for fracture  Vital signs reassuring.  Patient alert well-oriented no distress at her baseline no preceding recent medical illness.  Labs reassuring especially in the context of end-stage renal disease on hemodialysis.  Clinically doubt acute right hip fracture based on her ability to range and her examination.  X-ray imaging performed    Patient's presentation is most consistent with acute complicated illness / injury requiring diagnostic workup.     Clinical Course as of 03/25/23 1528  Tue Mar 25, 2023  1233 Patient has been resting comfortably.  Awaiting repeat CT scan approximately 5 PM.  I have consulted with Dr. Marcell Barlow.  Will plan for discharge if demonstrated stability of mass/meningioma.  Suspect this is likely no acute intracranial hemorrhage, however we will repeat imaging to assure no evidence of potential bleed at about 5 PM. [MQ]  1521 Fall - head injury, meningioma unchanged from prior.  CT head repeat at 5 pm.  Discharge home if stable [SM]    Clinical Course User Index [MQ] Sharyn Creamer, MD [SM] Corena Herter, MD   Ongoing care assigned to Dr. Arnoldo Morale with plan to follow-up to assure stability of CT head imaging scheduled for approximately 5 PM  FINAL CLINICAL IMPRESSION(S) / ED DIAGNOSES   Final diagnoses:  Minor head injury, initial encounter  Meningioma (HCC)  Contusion of right hip and thigh, initial encounter     Rx / DC Orders   ED Discharge Orders     None        Note:  This  document was prepared using Dragon voice recognition software and may include unintentional dictation errors.   Sharyn Creamer, MD 03/25/23 [phone]

## 2023-03-25 NOTE — ED Notes (Signed)
 Patient was able to eat lunch at bedside.

## 2023-03-25 NOTE — ED Triage Notes (Signed)
 Patient to ED via [facility] from home after a fall. Pt reports she was trying to sit down and missed the chair. Reports hitting head- takes blood thinners, denies LOC. C/o soreness in the right upper leg. Per family, she has been having frequent falls and difficulty with balance. Pt now states generalized weakness.

## 2023-03-25 NOTE — ED Provider Notes (Signed)
 Care assumed of patient from outgoing provider.  See their note for initial history, exam and plan.  Clinical Course as of 03/25/23 1843  Tue Mar 25, 2023  1233 Patient has been resting comfortably.  Awaiting repeat CT scan approximately 5 PM.  I have consulted with Dr. Marcell Barlow.  Will plan for discharge if demonstrated stability of mass/meningioma.  Suspect this is likely no acute intracranial hemorrhage, however we will repeat imaging to assure no evidence of potential bleed at about 5 PM. [MQ]  1521 Fall - head injury, meningioma unchanged from prior.  CT head repeat at 5 pm.  Discharge home if stable [SM]    Clinical Course User Index [MQ] Sharyn Creamer, MD [SM] Corena Herter, MD   CT stable.  Plan to discharge home.   Corena Herter, MD 03/25/23 1843

## 2023-03-25 NOTE — ED Notes (Signed)
 First Nurse Note: Pt to ED via [facility] from home for weakness and fall. Pt has been having increased falls over the past few days. Pt is full assist pt. Pt is dialysis pt (M,W,F). Pt fell today and hit her head. Pt is on Eliquis. Pt denies pain from the fall just states that she feels very weak.

## 2023-07-13 ENCOUNTER — Emergency Department

## 2023-07-13 ENCOUNTER — Inpatient Hospital Stay
Admission: EM | Admit: 2023-07-13 | Discharge: 2023-07-19 | DRG: 177 | Disposition: A | Discharge status: Home / Self Care | Attending: Internal Medicine | Admitting: Internal Medicine

## 2023-07-13 DIAGNOSIS — F419 Anxiety disorder, unspecified: Secondary | ICD-10-CM | POA: Diagnosis present

## 2023-07-13 DIAGNOSIS — Z1152 Encounter for screening for COVID-19: Secondary | ICD-10-CM

## 2023-07-13 DIAGNOSIS — Z79899 Other long term (current) drug therapy: Secondary | ICD-10-CM

## 2023-07-13 DIAGNOSIS — E785 Hyperlipidemia, unspecified: Secondary | ICD-10-CM | POA: Diagnosis present

## 2023-07-13 DIAGNOSIS — R079 Chest pain, unspecified: Secondary | ICD-10-CM | POA: Diagnosis present

## 2023-07-13 DIAGNOSIS — Z888 Allergy status to other drugs, medicaments and biological substances status: Secondary | ICD-10-CM

## 2023-07-13 DIAGNOSIS — D631 Anemia in chronic kidney disease: Secondary | ICD-10-CM | POA: Diagnosis present

## 2023-07-13 DIAGNOSIS — R578 Other shock: Secondary | ICD-10-CM | POA: Diagnosis present

## 2023-07-13 DIAGNOSIS — Z823 Family history of stroke: Secondary | ICD-10-CM

## 2023-07-13 DIAGNOSIS — N2581 Secondary hyperparathyroidism of renal origin: Secondary | ICD-10-CM | POA: Diagnosis present

## 2023-07-13 DIAGNOSIS — R1310 Dysphagia, unspecified: Secondary | ICD-10-CM | POA: Diagnosis present

## 2023-07-13 DIAGNOSIS — Z9013 Acquired absence of bilateral breasts and nipples: Secondary | ICD-10-CM

## 2023-07-13 DIAGNOSIS — Z833 Family history of diabetes mellitus: Secondary | ICD-10-CM

## 2023-07-13 DIAGNOSIS — I2489 Other forms of acute ischemic heart disease: Secondary | ICD-10-CM | POA: Diagnosis present

## 2023-07-13 DIAGNOSIS — E215 Disorder of parathyroid gland, unspecified: Secondary | ICD-10-CM | POA: Diagnosis present

## 2023-07-13 DIAGNOSIS — C641 Malignant neoplasm of right kidney, except renal pelvis: Secondary | ICD-10-CM | POA: Diagnosis present

## 2023-07-13 DIAGNOSIS — N186 End stage renal disease: Secondary | ICD-10-CM | POA: Diagnosis present

## 2023-07-13 DIAGNOSIS — N2889 Other specified disorders of kidney and ureter: Secondary | ICD-10-CM | POA: Diagnosis present

## 2023-07-13 DIAGNOSIS — I5043 Acute on chronic combined systolic (congestive) and diastolic (congestive) heart failure: Secondary | ICD-10-CM | POA: Diagnosis present

## 2023-07-13 DIAGNOSIS — I429 Cardiomyopathy, unspecified: Secondary | ICD-10-CM | POA: Diagnosis present

## 2023-07-13 DIAGNOSIS — Z91048 Other nonmedicinal substance allergy status: Secondary | ICD-10-CM

## 2023-07-13 DIAGNOSIS — Z7901 Long term (current) use of anticoagulants: Secondary | ICD-10-CM

## 2023-07-13 DIAGNOSIS — F32A Depression, unspecified: Secondary | ICD-10-CM | POA: Diagnosis present

## 2023-07-13 DIAGNOSIS — I132 Hypertensive heart and chronic kidney disease with heart failure and with stage 5 chronic kidney disease, or end stage renal disease: Secondary | ICD-10-CM | POA: Diagnosis present

## 2023-07-13 DIAGNOSIS — I161 Hypertensive emergency: Secondary | ICD-10-CM | POA: Diagnosis present

## 2023-07-13 DIAGNOSIS — J690 Pneumonitis due to inhalation of food and vomit: Principal | ICD-10-CM | POA: Diagnosis present

## 2023-07-13 DIAGNOSIS — I480 Paroxysmal atrial fibrillation: Secondary | ICD-10-CM | POA: Diagnosis present

## 2023-07-13 DIAGNOSIS — R1110 Vomiting, unspecified: Secondary | ICD-10-CM | POA: Diagnosis not present

## 2023-07-13 DIAGNOSIS — I340 Nonrheumatic mitral (valve) insufficiency: Secondary | ICD-10-CM | POA: Diagnosis present

## 2023-07-13 DIAGNOSIS — I7121 Aneurysm of the ascending aorta, without rupture: Secondary | ICD-10-CM | POA: Diagnosis present

## 2023-07-13 DIAGNOSIS — J9601 Acute respiratory failure with hypoxia: Principal | ICD-10-CM | POA: Diagnosis present

## 2023-07-13 DIAGNOSIS — R0609 Other forms of dyspnea: Secondary | ICD-10-CM | POA: Diagnosis not present

## 2023-07-13 DIAGNOSIS — F418 Other specified anxiety disorders: Secondary | ICD-10-CM | POA: Diagnosis present

## 2023-07-13 DIAGNOSIS — K219 Gastro-esophageal reflux disease without esophagitis: Secondary | ICD-10-CM | POA: Diagnosis present

## 2023-07-13 DIAGNOSIS — I472 Ventricular tachycardia, unspecified: Secondary | ICD-10-CM | POA: Diagnosis present

## 2023-07-13 DIAGNOSIS — I5021 Acute systolic (congestive) heart failure: Secondary | ICD-10-CM

## 2023-07-13 DIAGNOSIS — J9811 Atelectasis: Secondary | ICD-10-CM | POA: Diagnosis present

## 2023-07-13 DIAGNOSIS — J189 Pneumonia, unspecified organism: Secondary | ICD-10-CM | POA: Diagnosis present

## 2023-07-13 DIAGNOSIS — Z66 Do not resuscitate: Secondary | ICD-10-CM | POA: Diagnosis present

## 2023-07-13 DIAGNOSIS — I5023 Acute on chronic systolic (congestive) heart failure: Secondary | ICD-10-CM | POA: Diagnosis present

## 2023-07-13 DIAGNOSIS — D573 Sickle-cell trait: Secondary | ICD-10-CM | POA: Diagnosis present

## 2023-07-13 DIAGNOSIS — N368 Other specified disorders of urethra: Secondary | ICD-10-CM | POA: Diagnosis present

## 2023-07-13 DIAGNOSIS — Z90711 Acquired absence of uterus with remaining cervical stump: Secondary | ICD-10-CM

## 2023-07-13 DIAGNOSIS — I5033 Acute on chronic diastolic (congestive) heart failure: Secondary | ICD-10-CM | POA: Diagnosis present

## 2023-07-13 DIAGNOSIS — Z992 Dependence on renal dialysis: Secondary | ICD-10-CM

## 2023-07-13 DIAGNOSIS — Z853 Personal history of malignant neoplasm of breast: Secondary | ICD-10-CM

## 2023-07-13 DIAGNOSIS — Z8249 Family history of ischemic heart disease and other diseases of the circulatory system: Secondary | ICD-10-CM

## 2023-07-13 DIAGNOSIS — Z803 Family history of malignant neoplasm of breast: Secondary | ICD-10-CM

## 2023-07-13 DIAGNOSIS — I2510 Atherosclerotic heart disease of native coronary artery without angina pectoris: Secondary | ICD-10-CM | POA: Diagnosis present

## 2023-07-13 DIAGNOSIS — Z86718 Personal history of other venous thrombosis and embolism: Secondary | ICD-10-CM

## 2023-07-13 DIAGNOSIS — J810 Acute pulmonary edema: Secondary | ICD-10-CM | POA: Diagnosis present

## 2023-07-13 DIAGNOSIS — R112 Nausea with vomiting, unspecified: Secondary | ICD-10-CM | POA: Diagnosis present

## 2023-07-13 LAB — CBC WITH DIFFERENTIAL/PLATELET
Abs Immature Granulocytes: 0.08 K/uL — ABNORMAL HIGH (ref 0.00–0.07)
Basophils Absolute: 0.1 K/uL (ref 0.0–0.1)
Basophils Relative: 1 %
Eosinophils Absolute: 0.2 K/uL (ref 0.0–0.5)
Eosinophils Relative: 2 %
HCT: 30.4 % — ABNORMAL LOW (ref 36.0–46.0)
Hemoglobin: 10.3 g/dL — ABNORMAL LOW (ref 12.0–15.0)
Immature Granulocytes: 1 %
Lymphocytes Relative: 12 %
Lymphs Abs: 1.4 K/uL (ref 0.7–4.0)
MCH: 28.9 pg (ref 26.0–34.0)
MCHC: 33.9 g/dL (ref 30.0–36.0)
MCV: 85.2 fL (ref 80.0–100.0)
Monocytes Absolute: 0.8 K/uL (ref 0.1–1.0)
Monocytes Relative: 7 %
Neutro Abs: 9.1 K/uL — ABNORMAL HIGH (ref 1.7–7.7)
Neutrophils Relative %: 77 %
Platelets: 148 K/uL — ABNORMAL LOW (ref 150–400)
RBC: 3.57 MIL/uL — ABNORMAL LOW (ref 3.87–5.11)
RDW: 16.1 % — ABNORMAL HIGH (ref 11.5–15.5)
WBC: 11.7 K/uL — ABNORMAL HIGH (ref 4.0–10.5)
nRBC: 0 % (ref 0.0–0.2)

## 2023-07-13 LAB — LIPASE, BLOOD: Lipase: 25 U/L (ref 11–51)

## 2023-07-13 LAB — COMPREHENSIVE METABOLIC PANEL WITH GFR
ALT: 25 U/L (ref 0–44)
AST: 26 U/L (ref 15–41)
Albumin: 3.5 g/dL (ref 3.5–5.0)
Alkaline Phosphatase: 76 U/L (ref 38–126)
Anion gap: 16 — ABNORMAL HIGH (ref 5–15)
BUN: 29 mg/dL — ABNORMAL HIGH (ref 8–23)
CO2: 21 mmol/L — ABNORMAL LOW (ref 22–32)
Calcium: 10.6 mg/dL — ABNORMAL HIGH (ref 8.9–10.3)
Chloride: 99 mmol/L (ref 98–111)
Creatinine, Ser: 7.62 mg/dL — ABNORMAL HIGH (ref 0.44–1.00)
GFR, Estimated: 5 mL/min — ABNORMAL LOW (ref >60)
Glucose, Bld: 103 mg/dL — ABNORMAL HIGH (ref 70–99)
Potassium: 3.7 mmol/L (ref 3.5–5.1)
Sodium: 136 mmol/L (ref 135–145)
Total Bilirubin: 0.9 mg/dL (ref 0.0–1.2)
Total Protein: 7.1 g/dL (ref 6.5–8.1)

## 2023-07-13 LAB — HIV ANTIBODY (ROUTINE TESTING W REFLEX): HIV Screen 4th Generation wRfx: NONREACTIVE

## 2023-07-13 LAB — TROPONIN I (HIGH SENSITIVITY)
Troponin I (High Sensitivity): 40 ng/L — ABNORMAL HIGH (ref <18)
Troponin I (High Sensitivity): 48 ng/L — ABNORMAL HIGH (ref <18)

## 2023-07-13 LAB — CBG MONITORING, ED
Glucose-Capillary: 85 mg/dL (ref 70–99)
Glucose-Capillary: 110 mg/dL — ABNORMAL HIGH (ref 70–99)

## 2023-07-13 LAB — HEMOGLOBIN A1C
Hgb A1c MFr Bld: 4.7 % — ABNORMAL LOW (ref 4.8–5.6)
Mean Plasma Glucose: 88.19 mg/dL

## 2023-07-13 LAB — HEPATITIS B SURFACE ANTIGEN: Hepatitis B Surface Ag: NONREACTIVE

## 2023-07-13 LAB — LACTIC ACID, PLASMA: Lactic Acid, Venous: 1.2 mmol/L (ref 0.5–1.9)

## 2023-07-13 LAB — RESP PANEL BY RT-PCR (RSV, FLU A&B, COVID)  RVPGX2
Influenza A by PCR: NEGATIVE
Influenza B by PCR: NEGATIVE
Resp Syncytial Virus by PCR: NEGATIVE
SARS Coronavirus 2 by RT PCR: NEGATIVE

## 2023-07-13 LAB — BLOOD GAS, VENOUS

## 2023-07-13 LAB — BRAIN NATRIURETIC PEPTIDE: B Natriuretic Peptide: 1279.7 pg/mL — ABNORMAL HIGH (ref 0.0–100.0)

## 2023-07-13 MED ORDER — SODIUM CHLORIDE 0.9 % IV SOLN
1.0000 g | INTRAVENOUS | Status: AC
  Administered 2023-07-14 – 2023-07-17 (×4): 1 g via INTRAVENOUS
  Filled 2023-07-13 (×4): qty 10

## 2023-07-13 MED ORDER — VANCOMYCIN HCL IN DEXTROSE 1-5 GM/200ML-% IV SOLN
1000.0000 mg | Freq: Once | INTRAVENOUS | Status: AC
Start: 2023-07-13 — End: 2023-07-13
  Administered 2023-07-13: 1000 mg via INTRAVENOUS
  Filled 2023-07-13: qty 200

## 2023-07-13 MED ORDER — INSULIN ASPART 100 UNIT/ML IJ SOLN: 0.0000 [IU] | Freq: Three times a day (TID) | INTRAMUSCULAR | Status: DC

## 2023-07-13 MED ORDER — AMIODARONE HCL 200 MG PO TABS
200.0000 mg | ORAL_TABLET | Freq: Two times a day (BID) | ORAL | Status: DC
  Administered 2023-07-14 – 2023-07-15 (×4): 200 mg via ORAL
  Filled 2023-07-13 (×5): qty 1

## 2023-07-13 MED ORDER — ZOLPIDEM TARTRATE 5 MG PO TABS
10.0000 mg | ORAL_TABLET | Freq: Every day | ORAL | Status: DC
  Administered 2023-07-13: 10 mg via ORAL
  Filled 2023-07-13: qty 2

## 2023-07-13 MED ORDER — SODIUM CHLORIDE 0.9 % IV SOLN
500.0000 mg | INTRAVENOUS | Status: DC
  Administered 2023-07-14 – 2023-07-15 (×2): 500 mg via INTRAVENOUS
  Filled 2023-07-13 (×2): qty 5

## 2023-07-13 MED ORDER — APIXABAN 2.5 MG PO TABS
2.5000 mg | ORAL_TABLET | Freq: Two times a day (BID) | ORAL | Status: DC
  Administered 2023-07-14 – 2023-07-15 (×3): 2.5 mg via ORAL
  Filled 2023-07-13 (×5): qty 1

## 2023-07-13 MED ORDER — SACUBITRIL-VALSARTAN 24-26 MG PO TABS
1.0000 | ORAL_TABLET | Freq: Two times a day (BID) | ORAL | Status: DC
  Administered 2023-07-14 – 2023-07-19 (×8): 1 via ORAL
  Filled 2023-07-13 (×10): qty 1

## 2023-07-13 MED ORDER — SODIUM CHLORIDE 0.9 % IV SOLN
500.0000 mg | Freq: Once | INTRAVENOUS | Status: AC
  Administered 2023-07-13: 500 mg via INTRAVENOUS
  Filled 2023-07-13: qty 5

## 2023-07-13 MED ORDER — VANCOMYCIN HCL 1250 MG/250ML IV SOLN
1250.0000 mg | Freq: Once | INTRAVENOUS | Status: AC
  Administered 2023-07-13: 1250 mg via INTRAVENOUS
  Filled 2023-07-13 (×2): qty 250

## 2023-07-13 MED ORDER — HEPARIN SODIUM (PORCINE) 5000 UNIT/ML IJ SOLN: 5000.0000 [IU] | Freq: Three times a day (TID) | INTRAMUSCULAR | Status: DC

## 2023-07-13 MED ORDER — IOHEXOL 350 MG/ML SOLN
100.0000 mL | Freq: Once | INTRAVENOUS | Status: AC | PRN
  Administered 2023-07-13: 100 mL via INTRAVENOUS

## 2023-07-13 MED ORDER — LACTATED RINGERS IV SOLN: INTRAVENOUS | Status: DC

## 2023-07-13 MED ORDER — MORPHINE SULFATE (PF) 4 MG/ML IV SOLN
4.0000 mg | Freq: Once | INTRAVENOUS | Status: AC
  Administered 2023-07-13: 4 mg via INTRAVENOUS
  Filled 2023-07-13: qty 1

## 2023-07-13 MED ORDER — SODIUM CHLORIDE 0.9 % IV SOLN
2.0000 g | Freq: Once | INTRAVENOUS | Status: AC
  Administered 2023-07-13: 2 g via INTRAVENOUS
  Filled 2023-07-13: qty 12.5

## 2023-07-13 MED ORDER — SERTRALINE HCL 50 MG PO TABS
150.0000 mg | ORAL_TABLET | Freq: Every day | ORAL | Status: DC
  Administered 2023-07-13 – 2023-07-19 (×6): 150 mg via ORAL
  Filled 2023-07-13 (×7): qty 3

## 2023-07-13 MED ORDER — ONDANSETRON HCL 4 MG/2ML IJ SOLN
4.0000 mg | Freq: Once | INTRAMUSCULAR | Status: AC
  Administered 2023-07-13: 4 mg via INTRAVENOUS
  Filled 2023-07-13: qty 2

## 2023-07-13 MED ORDER — VANCOMYCIN HCL 500 MG/100ML IV SOLN
500.0000 mg | INTRAVENOUS | Status: DC
  Filled 2023-07-13: qty 100

## 2023-07-13 MED ORDER — CARVEDILOL 12.5 MG PO TABS
12.5000 mg | ORAL_TABLET | Freq: Two times a day (BID) | ORAL | Status: DC
  Administered 2023-07-14 – 2023-07-19 (×8): 12.5 mg via ORAL
  Filled 2023-07-13: qty 1
  Filled 2023-07-13: qty 2
  Filled 2023-07-13 (×8): qty 1

## 2023-07-13 MED ORDER — HEPARIN SODIUM (PORCINE) 1000 UNIT/ML IJ SOLN
INTRAMUSCULAR | Status: AC
  Filled 2023-07-13: qty 10

## 2023-07-13 MED ORDER — VANCOMYCIN HCL IN DEXTROSE 1-5 GM/200ML-% IV SOLN: 1000.0000 mg | INTRAVENOUS | Status: DC

## 2023-07-13 MED ORDER — MORPHINE SULFATE (PF) 2 MG/ML IV SOLN
2.0000 mg | Freq: Once | INTRAVENOUS | Status: AC
  Administered 2023-07-13: 2 mg via INTRAVENOUS
  Filled 2023-07-13: qty 1

## 2023-07-13 MED ORDER — CHLORHEXIDINE GLUCONATE CLOTH 2 % EX PADS
6.0000 | MEDICATED_PAD | Freq: Every day | CUTANEOUS | Status: DC
  Administered 2023-07-15 – 2023-07-19 (×5): 6 via TOPICAL
  Filled 2023-07-13 (×2): qty 6

## 2023-07-13 MED ORDER — TRAZODONE HCL 50 MG PO TABS
50.0000 mg | ORAL_TABLET | Freq: Every day | ORAL | Status: DC
  Administered 2023-07-13 – 2023-07-18 (×5): 50 mg via ORAL
  Filled 2023-07-13 (×6): qty 1

## 2023-07-13 MED ORDER — RENA-VITE PO TABS
1.0000 | ORAL_TABLET | Freq: Every day | ORAL | Status: DC
  Filled 2023-07-13: qty 1

## 2023-07-13 MED ORDER — PANTOPRAZOLE SODIUM 40 MG PO TBEC
40.0000 mg | DELAYED_RELEASE_TABLET | Freq: Every day | ORAL | Status: DC
  Administered 2023-07-13 – 2023-07-16 (×4): 40 mg via ORAL
  Filled 2023-07-13 (×4): qty 1

## 2023-07-13 MED ORDER — RENA-VITE PO TABS
1.0000 | ORAL_TABLET | Freq: Every day | ORAL | Status: DC
  Administered 2023-07-14 – 2023-07-19 (×5): 1 via ORAL
  Filled 2023-07-13 (×7): qty 1

## 2023-07-13 NOTE — ED Notes (Addendum)
 Pt trialed off of bipap. On [place] at 4L. Sating at 100%.

## 2023-07-13 NOTE — Code Documentation (Signed)
 CODE SEPSIS - PHARMACY COMMUNICATION  **Broad Spectrum Antibiotics should be administered within 1 hour of Sepsis diagnosis**  Time Code Sepsis Called/Page Received: 0904  Antibiotics Ordered: azithromycin , cefepime   Time of 1st antibiotic administration: 0946  Additional action taken by pharmacy: none required  If necessary, Name of Provider/Nurse Contacted: N/A    Adriana JONETTA Bolster ,PharmD Clinical Pharmacist  07/13/2023  9:06 AM

## 2023-07-13 NOTE — Sepsis Progress Note (Signed)
 Elink following code sepsis

## 2023-07-13 NOTE — Assessment & Plan Note (Signed)
 Echocardiogram from 2023 demonstrated EF of 50-55%. Repeat echo is pending.

## 2023-07-13 NOTE — ED Notes (Addendum)
 Mansy MD aware and Viviann MD aware repeat EKG completed due to rhythm change on monitor.

## 2023-07-13 NOTE — ED Notes (Signed)
 Lactic and blood cultures collected at this time.

## 2023-07-13 NOTE — Assessment & Plan Note (Addendum)
 Blood pressures with systolic BP of 197 and diastolic pressures of 120. The patient's last dialysis was on Friday. Nephrology has been consulted and plan is for HD today followed by resumption of the patient's usual MWF schedule tomorrow.  Continue Coreg  12.5 bid and Entresto  24-26 bid.

## 2023-07-13 NOTE — Assessment & Plan Note (Signed)
 Due to secondary hyperparathyroidism.

## 2023-07-13 NOTE — Assessment & Plan Note (Signed)
 Blood pressures with systolic BP of 197 and diastolic pressures of 120. The patient's last dialysis was on Friday. Nephrology has been consulted and plan is for HD today followed by resumption of the patient's usual MWF schedule tomorrow.

## 2023-07-13 NOTE — ED Provider Notes (Addendum)
 [facility] Provider Note    Event Date/Time   First MD Initiated Contact with Patient 07/13/23 0701     (approximate)   History   No chief complaint on file.   HPI  Teresa Cook is a 68 y.o. female past medical history significant for ESRD on HD MWF, previous blood clot on anticoagulation, who presents to the emergency department with shortness of breath and chest pain.  Patient states that she has not been feeling well for the past 2 weeks.  Endorses upper abdominal pain associate with nausea and vomiting.  States that she has been vomiting since Friday, she is uncertain if she is kept her home medications down.  Also complaining of shortness of breath.  No significant chest pain.  Does endorse some mild diarrhea.  Last went to dialysis on Friday.  He does not still make urine.  No fever or chills.  Denies significant cough.   When EMS arrived patient was found to be hypoxic.  Patient was placed on 5 L nasal cannula and continued to be hypoxic so was then placed on a nonrebreather.  Patient Dors is ongoing shortness of breath and chest tightness.     Physical Exam   Triage Vital Signs: ED Triage Vitals  Encounter Vitals Group     BP 07/13/23 0638 (!) 207/131     Girls Systolic BP Percentile --      Girls Diastolic BP Percentile --      Boys Systolic BP Percentile --      Boys Diastolic BP Percentile --      Pulse Rate 07/13/23 0638 98     Resp 07/13/23 0638 (!) 36     Temp 07/13/23 0638 98.5 F (36.9 C)     Temp Source 07/13/23 0638 Oral     SpO2 07/13/23 0636 94 %     Weight 07/13/23 0639 126 lb (57.2 kg)     Height 07/13/23 0639 5' 5 (1.651 m)     Head Circumference --      Peak Flow --      Pain Score 07/13/23 0639 10     Pain Loc --      Pain Education --      Exclude from Growth Chart --     Most recent vital signs: Vitals:   07/13/23 0830 07/13/23 0839  BP: (!) 191/100   Pulse: 100 89  Resp: (!) 33 19  Temp:    SpO2: 100% 99%     Physical Exam Constitutional:      Appearance: She is well-developed. She is ill-appearing.  HENT:     Head: Atraumatic.     Mouth/Throat:     Mouth: Mucous membranes are dry.  Eyes:     Extraocular Movements: Extraocular movements intact.     Conjunctiva/sclera: Conjunctivae normal.     Pupils: Pupils are equal, round, and reactive to light.  Cardiovascular:     Rate and Rhythm: Regular rhythm.  Pulmonary:     Effort: Respiratory distress present.     Breath sounds: Wheezing and rhonchi present.     Comments: Patient is currently on nonrebreather at 15 L at 97% Abdominal:     General: There is no distension.     Tenderness: There is abdominal tenderness (Epigastric abdominal tenderness to palpation).  Musculoskeletal:        General: Normal range of motion.     Cervical back: Normal range of motion.     Right lower leg:  No edema.     Left lower leg: No edema.  Skin:    General: Skin is warm.     Capillary Refill: Capillary refill takes less than 2 seconds.  Neurological:     General: No focal deficit present.     Mental Status: She is alert. Mental status is at baseline.  Psychiatric:        Mood and Affect: Mood normal.     IMPRESSION / MDM / ASSESSMENT AND PLAN / ED COURSE  I reviewed the triage vital signs and the nursing notes.  Differential diagnosis including anemia, pneumonia, small bowel obstruction, acute cholecystitis, intra-abdominal infection, electrolyte abnormality, hypertensive emergency, ACS  Patient currently on a nonrebreather and appears comfortable.  Plan for lab work and chest x-ray and further workup and will attempt to get the patient off nonrebreather back to nasal cannula.  Patient does have a history of pulmonary embolism and has been compliant with her home medications although questionably has been vomiting her Eliquis .  Does have a history of seizure disorder.  On chart review no recent hospitalizations.   EKG  I, Clotilda Punter, the  attending physician, personally viewed and interpreted this ECG.  EKG showed significant artifact.  Appears to have sinus rhythm.  Questionable flutter waves.  Questionable findings of atrial enlargement.  Nonspecific ST changes.  Appears to be similar to prior EKG March 15, 2023  No tachycardic or bradycardic dysrhythmias while on cardiac telemetry.  RADIOLOGY I independently reviewed imaging, my interpretation of imaging: Aorta appears calcified.  Diffuse findings concerning for pulmonary edema.  No focal findings of pneumonia.  No obvious pneumothorax.  CTA and CT scan abdomen and pelvis read as no pulmonary embolism.  Pulmonary edema with concern for superimposed infection versus aspiration to the right lung.  Also noted concern for renal cell carcinoma.  LABS (all labs ordered are listed, but only abnormal results are displayed) Labs interpreted as -    Labs Reviewed  COMPREHENSIVE METABOLIC PANEL WITH GFR - Abnormal; Notable for the following components:      Result Value   CO2 21 (*)    Glucose, Bld 103 (*)    BUN 29 (*)    Creatinine, Ser 7.62 (*)    Calcium  10.6 (*)    GFR, Estimated 5 (*)    Anion gap 16 (*)    All other components within normal limits  CBC WITH DIFFERENTIAL/PLATELET - Abnormal; Notable for the following components:   WBC 11.7 (*)    RBC 3.57 (*)    Hemoglobin 10.3 (*)    HCT 30.4 (*)    RDW 16.1 (*)    Platelets 148 (*)    Neutro Abs 9.1 (*)    Abs Immature Granulocytes 0.08 (*)    All other components within normal limits  BRAIN NATRIURETIC PEPTIDE - Abnormal; Notable for the following components:   B Natriuretic Peptide 1,279.7 (*)    All other components within normal limits  BLOOD GAS, VENOUS - Abnormal; Notable for the following components:   pO2, Ven <31 (*)    All other components within normal limits  TROPONIN I (HIGH SENSITIVITY) - Abnormal; Notable for the following components:   Troponin I (High Sensitivity) 40 (*)    All other  components within normal limits  RESP PANEL BY RT-PCR (RSV, FLU A&B, COVID)  RVPGX2  CULTURE, BLOOD (ROUTINE X 2)  CULTURE, BLOOD (ROUTINE X 2)  LIPASE, BLOOD  LACTIC ACID, PLASMA  LACTIC ACID, PLASMA  TROPONIN  I (HIGH SENSITIVITY)     MDM  Plan for a CTA pulmonary embolism study and CT scan abdomen and pelvis with contrast given that the patient does not make any urine.  Given IV Zofran  and IV morphine  for pain control  Lab work with leukocytosis of 11.7.  Does have anemia with a hemoglobin of 10.3.  Platelets are chronically low at 148.  Creatinine appears to be at her baseline at 7.6.  Normal BUN.  No significant electrolyte abnormality.  Does have an elevated ion gap of 16 COVID and influenza testing are negative  Troponin elevated at 40,  last troponin was 11/2021 and was 11 has been as elevated as 190 in the past.  BNP significantly elevated at 1200, previously has been 120  Chest x-ray read as small bilateral pleural effusions and increased interstitial edema concerning for pulmonary edema.  Will attempt on BiPAP given her significant pulmonary edema  Difficult IV stick, delay in antibiotics.  Did not want to delay antibiotics for blood cultures given that it could be detrimental to the patient.  Started on antibiotics and obtained blood culture.  Discussed concern for renal cell carcinoma with the patient.  Reaching out to nephrology given that the patient likely needs another session of dialysis, discussed with Dr. Douglas -recommended admission to the hospitalist will attempt for dialysis at bedside today if still requiring BiPAP otherwise will do dialysis off BiPAP.  Will consult hospitalist for admission for acute hypoxic respiratory failure in the setting of pulmonary edema and aspiration pneumonia     PROCEDURES:  Critical Care performed: yes  .Critical Care  Performed by: Suzanne Kirsch, MD Authorized by: Suzanne Kirsch, MD   Critical care provider statement:     Critical care time (minutes):  45   Critical care time was exclusive of:  Separately billable procedures and treating other patients   Critical care was necessary to treat or prevent imminent or life-threatening deterioration of the following conditions:  Respiratory failure   Critical care was time spent personally by me on the following activities:  Development of treatment plan with patient or surrogate, discussions with consultants, evaluation of patient's response to treatment, examination of patient, ordering and review of laboratory studies, ordering and review of radiographic studies, ordering and performing treatments and interventions, pulse oximetry, re-evaluation of patient's condition and review of old charts   Care discussed with: admitting provider     Patient's presentation is most consistent with acute presentation with potential threat to life or bodily function.   MEDICATIONS ORDERED IN ED: Medications  lactated ringers  infusion (has no administration in time range)  vancomycin  (VANCOCIN ) IVPB 1000 mg/200 mL premix (has no administration in time range)  ceFEPIme  (MAXIPIME ) 2 g in sodium chloride  0.9 % 100 mL IVPB (has no administration in time range)  azithromycin  (ZITHROMAX ) 500 mg in sodium chloride  0.9 % 250 mL IVPB (has no administration in time range)  ondansetron  (ZOFRAN ) injection 4 mg (4 mg Intravenous Given 07/13/23 0735)  morphine  (PF) 2 MG/ML injection 2 mg (2 mg Intravenous Given 07/13/23 0734)  iohexol  (OMNIPAQUE ) 350 MG/ML injection 100 mL (100 mLs Intravenous Contrast Given 07/13/23 0806)  morphine  (PF) 4 MG/ML injection 4 mg (4 mg Intravenous Given 07/13/23 0845)    FINAL CLINICAL IMPRESSION(S) / ED DIAGNOSES   Final diagnoses:  Acute respiratory failure with hypoxia (HCC)  Renal mass  Pneumonia of right lung due to infectious organism, unspecified part of lung     Rx / DC Orders  ED Discharge Orders     None        Note:  This document was  prepared using Dragon voice recognition software and may include unintentional dictation errors.   Suzanne Kirsch, MD 07/13/23 9087    Suzanne Kirsch, MD 07/13/23 [phone]

## 2023-07-13 NOTE — Assessment & Plan Note (Signed)
 Continue zoloft

## 2023-07-13 NOTE — Assessment & Plan Note (Signed)
 Plan is for dialysis later today. The patient is 2L over her dry weight per nephrology

## 2023-07-13 NOTE — ED Notes (Signed)
 This RN messaged Ava Swayze, DO to request a diet order be placed for pt.

## 2023-07-13 NOTE — H&P (Signed)
 History and Physical    Patient: Teresa Cook FMW:[id number] DOB: [birth year] DOA: 07/13/2023 DOS: the patient was seen and examined on 07/13/2023 PCP: [facility]  Patient coming from: Home  Chief Complaint: No chief complaint on file.  HPI: Teresa Cook is a 68 y.o. female with medical history significant for ESRD on HD, anemia secondary to ESRD, Anxiety, CHF, Depression, DVT right thigh and left leg, dysrhythmia, hypertension, gout, parathyroid  disease, sickle cell trait.  She presented to [facility] ED on 07/13/2023 with complaints of chest pressure and shortness of breath. This is worse that the usually feeling she gets that she has a tight band around her chest. This is not particularly exertional. She also states that she has had difficulty swallowing in the last couple of weeks feeling that all consistencies of intake get stuck in her esophagus. Many times it just comes back up.   The patient denies fevers, chills, cough, or sputum production. She also denies diarrhea  In the ED the patient underwent a CT chest/abdomen/pelvis that has demonstrated:  No pulmonary embolism, moderate bilateral pleural effusions with associated atelectasis or consolidation. There is diffuse septal interlobular thickening throughout both lungs consistent with pulmonary edema. There is extensive heterogeneous and ground-glass airspace disease throughout the right upper lobe consistent with superimposed infection or aspiration. There is also an ascending thoracic aortic aneurysm. There recommendation is for semi-annual imaging follow up by CTA or MRA and referral to CTS. There are severely atrophic cystic kidneys consistent with chronic renal disease and dialysis. There is also an enhancing mass arising from the inferiior pole of the right kidney measuring 2.5 x 2.4 cm consistent with renal cell carcinoma. A multiphasic contrast enhanced CT or MRI on a non-emergent outpatient basis for a more  confident assessment.  The patient is now requiring BIPAP with 40% O2 and 15L/min to maintain her oxygen saturation in the 90's. She has received cefepime , flagyl , and vancomycin  in the ED. Blood cultures x 2 have been obtained.   The patient has confirmed that she wishes to be a DNR.   Review of Systems: As mentioned in the history of present illness. All other systems reviewed and are negative. Past Medical History:  Diagnosis Date   Anemia    Anxiety    Breast cancer (HCC) 01/2016   bilateral   CHF (congestive heart failure) (HCC)    Chronic kidney disease    Depression    Dialysis patient [facility])    DVT (deep venous thrombosis) (HCC)    left leg   DVT (deep venous thrombosis) (HCC) 1985   right thigh   Dysrhythmia    Gout    Headache    HTN (hypertension)    Hypertension    Nausea vomiting and diarrhea    Parathyroid  abnormality (HCC)    Parathyroid  disease (HCC)    Pneumonia 12/2015   Psoriasis    Renal insufficiency    Sickle cell trait (HCC)    traits   Past Surgical History:  Procedure Laterality Date   ABDOMINAL HYSTERECTOMY  1980   APPENDECTOMY     BREAST BIOPSY Left 10/28/2013   benign   BREAST EXCISIONAL BIOPSY Left 2002   benign   CORONARY ANGIOGRAPHY N/A 04/04/2020   Procedure: CORONARY ANGIOGRAPHY;  Surgeon: Darron Deatrice LABOR, MD;  Location: [facility] INV[facility]IVE CV [facility];  Service: Cardiovascular;  Laterality: N/A;   CYSTOSCOPY N/A 12/06/2021   Procedure: CYSTOSCOPY;  Surgeon: Twylla Glendia BROCKS, MD;  Location: [facility] ORS;  Service: Urology;  Laterality: N/A;   DIALYSIS/PERMA CATHETER INSERTION N/A 02/27/2022   Procedure: DIALYSIS/PERMA CATHETER INSERTION;  Surgeon: Marea Selinda RAMAN, MD;  Location: [facility];  Service: Cardiovascular;  Laterality: N/A;   INSERTION OF DIALYSIS CATHETER  2014   LIPOMA EXCISION N/A 01/23/2016   Procedure: EXCISION LIPOMA;  Surgeon: Dorothyann LITTIE Husk, MD;  Location: [facility] OR[facility];  Service: General;  Laterality: N/A;   MASTECTOMY W/  SENTINEL NODE BIOPSY Bilateral 01/23/2016   Procedure: bilateral MASTECTOMY WITH  bilateral SENTINEL LYMPH NODE BIOPSY possible left axillary node dissection forehead lipoma removal;  Surgeon: Dorothyann LITTIE Husk, MD;  Location: [facility] OR[facility];  Service: General;  Laterality: Bilateral;   PARTIAL HYSTERECTOMY     PERIPHERAL VASCULAR CATHETERIZATION N/A 12/25/2015   Procedure: Dialysis/Perma Catheter Insertion;  Surgeon: Selinda RAMAN Marea, MD;  Location: [facility];  Service: Cardiovascular;  Laterality: N/A;   PERIPHERAL VASCULAR CATHETERIZATION Left 01/22/2016   Procedure: Dialysis/Perma Catheter Insertion;  Surgeon: Selinda RAMAN Marea, MD;  Location: [facility];  Service: Cardiovascular;  Laterality: Left;   PERIPHERAL VASCULAR CATHETERIZATION N/A 01/26/2016   Procedure: Dialysis/Perma Catheter Insertion;  Surgeon: Cordella KANDICE Shawl, MD;  Location: [facility];  Service: Cardiovascular;  Laterality: N/A;   PORT-A-CATH REMOVAL N/A 12/20/2015   Procedure: REMOVAL PORT-A-CATH;  Surgeon: Dorothyann LITTIE Husk, MD;  Location: [facility];  Service: General;  Laterality: N/A;  left     PORTACATH PLACEMENT Left 08/21/2015   Procedure: INSERTION PORT-A-CATH;  Surgeon: Dorothyann LITTIE Husk, MD;  Location: [facility];  Service: General;  Laterality: Left;   REMOVAL OF A DIALYSIS CATHETER  2017   RENAL ANGIOGRAPHY Right 11/05/2021   Procedure: RENAL ANGIOGRAPHY;  Surgeon: Marea Selinda RAMAN, MD;  Location: [facility];  Service: Cardiovascular;  Laterality: Right;  with embolization   RIGHT HEART CATH N/A 04/04/2020   Procedure: RIGHT HEART CATH;  Surgeon: Darron Deatrice LABOR, MD;  Location: [facility];  Service: Cardiovascular;  Laterality: N/A;   URETEROSCOPY Bilateral 12/06/2021   Procedure: URETEROSCOPY;  Surgeon: Twylla Glendia BROCKS, MD;  Location: [facility];  Service: Urology;  Laterality: Bilateral;   Social History:  reports that she has never smoked. She has never used smokeless tobacco. She  reports that she does not drink alcohol and does not use drugs.  Allergies  Allergen Reactions   Gabapentin  Other (See Comments)    Seizure   Adhesive [Tape] Itching    Silk tape is ok to use.    Family History  Problem Relation Age of Onset   Stroke Mother    CVA Mother    Hypertension Mother    Hypertension Father    Hypertension Sister    Diabetes Sister    Cancer Sister 49       Breast   Stroke Brother    Hypertension Brother    Breast cancer Maternal Aunt 70    Prior to Admission medications   Medication Sig Start Date End Date Taking? Authorizing Provider  ALPRAZolam  (XANAX ) 0.25 MG tablet Take 1 tablet (0.25 mg total) by mouth 2 (two) times daily as needed for anxiety. Patient not taking: Reported on 02/27/2022 11/22/21   Patel, Sona, MD  amiodarone  (PACERONE ) 200 MG tablet Take 1 tablet (200 mg total) by mouth 2 (two) times daily. 11/22/21   Patel, Sona, MD  apixaban  (ELIQUIS ) 2.5 MG TABS tablet Take 1 tablet (2.5 mg total) by mouth 2 (two) times daily. 12/06/21   Alexander, Natalie, DO  b complex-vitamin c-folic acid  (NEPHRO-VITE) 0.8 MG TABS tablet Take 1 tablet by mouth daily.    [provider]  calcium  carbonate (TUMS - DOSED IN MG ELEMENTAL CALCIUM ) 500 MG chewable tablet Chew 1 tablet (200 mg of elemental calcium  total) by mouth 2 (two) times daily as needed for indigestion or heartburn. Patient not taking: Reported on 02/27/2022 07/30/21   Josette Ade, MD  lidocaine  (LIDODERM ) 5 % Place 2 patches onto the skin daily. Remove & Discard patch within 12 hours or as directed by MD Patient not taking: Reported on 02/27/2022 11/22/21   Patel, Sona, MD  pantoprazole  (PROTONIX ) 40 MG tablet Take 1 tablet (40 mg total) by mouth 2 (two) times daily. 07/30/21   Josette Ade, MD  polyethylene glycol (MIRALAX  / GLYCOLAX ) 17 g packet Take 17 g by mouth daily as needed for mild constipation. Patient not taking: Reported on 11/30/2021 11/22/21   Patel, Sona, MD   sacubitril -valsartan  (ENTRESTO ) 24-26 MG Take 1 tablet by mouth 2 (two) times daily. 02/15/21   Darliss Rogue, MD  sertraline  (ZOLOFT ) 100 MG tablet Take 100 mg by mouth daily.    [provider]  sevelamer  carbonate (RENVELA ) 800 MG tablet Take 3 tablets (2,400 mg total) by mouth with breakfast, with lunch, and with evening meal. One with snacks 07/30/21   Josette Ade, MD  traZODone  (DESYREL ) 50 MG tablet Take 50 mg by mouth at bedtime. 03/01/21   [provider]  zolpidem  (AMBIEN ) 10 MG tablet Take 0.5 tablets (5 mg total) by mouth at bedtime. 07/30/21   Josette Ade, MD    Physical Exam: Vitals:   07/13/23 1400 07/13/23 1430 07/13/23 1500 07/13/23 1511  BP: (!) 164/99 (!) 152/100 (!) 139/101 (!) 167/100  Pulse: 85 82 84 83  Resp: (!) 24 20  20   Temp:    97.9 F (36.6 C)  TempSrc:    Oral  SpO2: 100% 100% 100% 100%  Weight:      Height:       Exam:  Constitutional:  The patient is awake, alert, and oriented x 3. She appears in mild Eyes:  pupils and irises appear normal Normal lids and conjunctivae ENMT:  grossly normal hearing  Lips appear normal external ears, nose appear normal Oropharynx: mucosa, tongue,posterior pharynx appear normal Neck:  neck appears normal, no masses, normal ROM, supple no thyromegaly Respiratory:  Patient is using accessory muscles and is tachypneic with conversation. Diffuse rales No wheezes or rhonchi No tactile fremitus Cardiovascular:  Regular rate and rhythm No murmurs, ectopy, or gallups. No lateral PMI. No thrills. Abdomen:  Abdomen is soft, non-tender, non-distended No hernias, masses, or organomegaly Normoactive bowel sounds.  Musculoskeletal:  No cyanosis, clubbing, or edema Skin:  No rashes, lesions, ulcers palpation of skin: no induration or nodules Neurologic:  CN 2-12 intact Sensation all 4 extremities intact Psychiatric:  Mental status Mood, affect appropriate Orientation to person,  place, time  judgment and insight appear intact  Data Reviewed:  CT chest abdomen and pelvis  BNP CBC CMP HbA1c Lactic acid Troponin I EKG   Assessment and Plan: Acute respiratory failure with hypoxia (HCC) The patient is saturating 100 % on BIPAP with 40% FIO2 and 15L of flow. This is due to pulmonary edema. She is not usually on oxygen.  Acute on chronic systolic CHF (congestive heart failure) (HCC) Echocardiogram from 2023 demonstrated EF of 50-55%. Repeat echo is pending.   Hypertensive emergency Blood pressures with systolic BP of 197 and diastolic pressures  of 120. The patient's last dialysis was on Friday. Nephrology has been consulted and plan is for HD today followed by resumption of the patient's usual MWF schedule tomorrow.  Continue Coreg  12.5 bid and Entresto  24-26 bid.  ESRD on dialysis (HCC) Blood pressures with systolic BP of 197 and diastolic pressures of 120. The patient's last dialysis was on Friday. Nephrology has been consulted and plan is for HD today followed by resumption of the patient's usual MWF schedule tomorrow.  Parathyroid  disease (HCC) Noted. The patient has an increased calcium  today of 10.6. She receives sevelamer  with meals and etelcalcetide as outpatient.  Hypercalcemia Due to secondary hyperparathyroidism.  Depression with anxiety Continue zoloft .  Chest pain Due to uncontrolled hypertension.   Acute pulmonary edema (HCC) Plan is for dialysis later today. The patient is 2L over her dry weight per nephrology  Dysphagia The patient complains that things get stuck in the upper part of her esophagus. All consistencies of things cause a problem. Often whatever it is she trying to swallow will come back up. She has lost a significant amount of weight in the past 6 months. She also associates a tightness in her chest when she is unable to swallow things.  I have seen and examined this patient myself. I have spent 65 minutes in her  evaluation and admission.     Advance Care Planning:   Code Status: Limited: Do not attempt resuscitation (DNR) -DNR-LIMITED -Do Not Intubate/DNI  Confirmed verbally with patient.  Consults: Nephrology  Family Communication: Family at bedside.  Severity of Illness: The appropriate patient status for this patient is INPATIENT. Inpatient status is judged to be reasonable and necessary in order to provide the required intensity of service to ensure the patient's safety. The patient's presenting symptoms, physical exam findings, and initial radiographic and laboratory data in the context of their chronic comorbidities is felt to place them at high risk for further clinical deterioration. Furthermore, it is not anticipated that the patient will be medically stable for discharge from the hospital within 2 midnights of admission.   * I certify that at the point of admission it is my clinical judgment that the patient will require inpatient hospital care spanning beyond 2 midnights from the point of admission due to high intensity of service, high risk for further deterioration and high frequency of surveillance required.*  Author: Lothar Prehn, DO 07/13/2023 3:28 PM  For on call review [email or web address].

## 2023-07-13 NOTE — Assessment & Plan Note (Addendum)
 The patient complains that things get stuck in the upper part of her esophagus. All consistencies of things cause a problem. Often whatever it is she trying to swallow will come back up. She has lost a significant amount of weight in the past 6 months. She also associates a tightness in her chest when she is unable to swallow things.

## 2023-07-13 NOTE — Progress Notes (Signed)
 [facility]  ROUNDING NOTE   Subjective:   [name] was admitted to [facility] on 07/13/2023 for Renal mass [N28.89] Acute respiratory failure with hypoxia (HCC) [J96.01] Pneumonia of right lung due to infectious organism, unspecified part of lung [J18.9] Acute hypoxic respiratory failure (HCC) [J96.01]  Last hemodialysis treatment was Friday. Patient received 2:45 minutes of prescribed dialysis treatment. Patient is 2 kg above her dry weight.   Family at bedside.  Patient placed on BIPAP. Placed on azithromycin , cefepime  and vancomycin . Given IV morphine  and weaned down to 4L [place] O2.   Objective:  Vital signs in last 24 hours:  Temp:  [98.2 F (36.8 C)-98.5 F (36.9 C)] 98.2 F (36.8 C) (07/06 1026) Pulse Rate:  [83-100] 83 (07/06 1145) Resp:  [12-36] 12 (07/06 1145) BP: (161-207)/(100-131) 161/105 (07/06 1145) SpO2:  [94 %-100 %] 96 % (07/06 1145) FiO2 (%):  [40 %] 40 % (07/06 0839) Weight:  [57.2 kg] 57.2 kg (07/06 0639)  Weight change:  Filed Weights   07/13/23 0639  Weight: 57.2 kg    Intake/Output: No intake/output data recorded.   Intake/Output this shift:  No intake/output data recorded.  Physical Exam: General: Ill appearing  Head: Normocephalic, atraumatic. Moist oral mucosal membranes  Eyes: Anicteric, PERRL  Neck: Supple, trachea midline  Lungs:  Bilateral crackles, wheezes on left. 4L [place] O2.  Heart: Regular rate and rhythm  Abdomen:  Soft, nontender,   Extremities:  no peripheral edema.  Neurologic: Nonfocal, moving all four extremities  Skin: No lesions  Access: Right femoral permcath    Basic Metabolic Panel: Recent Labs  Lab 07/13/23 0645  NA 136  K 3.7  CL 99  CO2 21*  GLUCOSE 103*  BUN 29*  CREATININE 7.62*  CALCIUM  10.6*    Liver Function Tests: Recent Labs  Lab 07/13/23 0645  AST 26  ALT 25  ALKPHOS 76  BILITOT 0.9  PROT 7.1  ALBUMIN  3.5   Recent Labs  Lab 07/13/23 0645  LIPASE 25   No results for  input(s): AMMONIA in the last 168 hours.  CBC: Recent Labs  Lab 07/13/23 0645  WBC 11.7*  NEUTROABS 9.1*  HGB 10.3*  HCT 30.4*  MCV 85.2  PLT 148*    Cardiac Enzymes: No results for input(s): CKTOTAL, CKMB, CKMBINDEX, TROPONINI in the last 168 hours.  BNP: Invalid input(s): POCBNP  CBG: No results for input(s): GLUCAP in the last 168 hours.  Microbiology: Results for orders placed or performed during the hospital encounter of 07/13/23  Resp panel by RT-PCR (RSV, Flu A&B, Covid) Anterior Nasal Swab     Status: None   Collection Time: 07/13/23  6:45 AM   Specimen: Anterior Nasal Swab  Result Value Ref Range Status   SARS Coronavirus 2 by RT PCR NEGATIVE NEGATIVE Final    Comment: (NOTE) SARS-CoV-2 target nucleic acids are NOT DETECTED.  The SARS-CoV-2 RNA is generally detectable in upper respiratory specimens during the acute phase of infection. The lowest concentration of SARS-CoV-2 viral copies this assay can detect is 138 copies/mL. A negative result does not preclude SARS-Cov-2 infection and should not be used as the sole basis for treatment or other patient management decisions. A negative result may occur with  improper specimen collection/handling, submission of specimen other than nasopharyngeal swab, presence of viral mutation(s) within the areas targeted by this assay, and inadequate number of viral copies(<138 copies/mL). A negative result must be combined with clinical observations, patient history, and epidemiological information. The expected  result is Negative.  Fact Sheet for Patients:  BloggerCourse.com  Fact Sheet for Healthcare Providers:  SeriousBroker.it  This test is no t yet approved or cleared by the [place]  FDA and  has been authorized for detection and/or diagnosis of SARS-CoV-2 by FDA under an Emergency Use Authorization (EUA). This EUA will remain  in effect (meaning  this test can be used) for the duration of the COVID-19 declaration under Section 564(b)(1) of the Act, 21 U.S.C.section 360bbb-3(b)(1), unless the authorization is terminated  or revoked sooner.       Influenza A by PCR NEGATIVE NEGATIVE Final   Influenza B by PCR NEGATIVE NEGATIVE Final    Comment: (NOTE) The Xpert Xpress SARS-CoV-2/FLU/RSV plus assay is intended as an aid in the diagnosis of influenza from Nasopharyngeal swab specimens and should not be used as a sole basis for treatment. Nasal washings and aspirates are unacceptable for Xpert Xpress SARS-CoV-2/FLU/RSV testing.  Fact Sheet for Patients: BloggerCourse.com  Fact Sheet for Healthcare Providers: SeriousBroker.it  This test is not yet approved or cleared by the [place]  FDA and has been authorized for detection and/or diagnosis of SARS-CoV-2 by FDA under an Emergency Use Authorization (EUA). This EUA will remain in effect (meaning this test can be used) for the duration of the COVID-19 declaration under Section 564(b)(1) of the Act, 21 U.S.C. section 360bbb-3(b)(1), unless the authorization is terminated or revoked.     Resp Syncytial Virus by PCR NEGATIVE NEGATIVE Final    Comment: (NOTE) Fact Sheet for Patients: BloggerCourse.com  Fact Sheet for Healthcare Providers: SeriousBroker.it  This test is not yet approved or cleared by the [place]  FDA and has been authorized for detection and/or diagnosis of SARS-CoV-2 by FDA under an Emergency Use Authorization (EUA). This EUA will remain in effect (meaning this test can be used) for the duration of the COVID-19 declaration under Section 564(b)(1) of the Act, 21 U.S.C. section 360bbb-3(b)(1), unless the authorization is terminated or revoked.  Performed at [facility], [address] [place], [place] [address]     Coagulation  Studies: No results for input(s): LABPROT, INR in the last 72 hours.  Urinalysis: No results for input(s): COLORURINE, LABSPEC, PHURINE, GLUCOSEU, HGBUR, BILIRUBINUR, KETONESUR, PROTEINUR, UROBILINOGEN, NITRITE, LEUKOCYTESUR in the last 72 hours.  Invalid input(s): APPERANCEUR    Imaging: CT Angio Chest PE W/Cm &/Or Wo Cm Result Date: 07/13/2023 CLINICAL DATA:  Chest and abdominal pain for 2-4 weeks, hypoxia, dialysis * Tracking Code: BO * EXAM: CT ANGIOGRAPHY CHEST CT ABDOMEN AND PELVIS WITH CONTRAST TECHNIQUE: Multidetector CT imaging of the chest was performed using the standard protocol during bolus administration of intravenous contrast. Multiplanar CT image reconstructions and MIPs were obtained to evaluate the vascular anatomy. Multidetector CT imaging of the abdomen and pelvis was performed using the standard protocol during bolus administration of intravenous contrast. RADIATION DOSE REDUCTION: This exam was performed according to the departmental dose-optimization program which includes automated exposure control, adjustment of the mA and/or kV according to patient size and/or use of iterative reconstruction technique. CONTRAST:  OMNIPAQUE  IOHEXOL  350 MG/ML SOLN COMPARISON:  12/05/2021 FINDINGS: CT CHEST ANGIOGRAM FINDINGS Cardiovascular: Satisfactory opacification of the pulmonary arteries to the segmental level. No evidence of pulmonary embolism. Cardiomegaly. Left and right coronary artery calcifications. No pericardial effusion. Enlargement of the tubular ascending thoracic aorta measuring up to 4.6 x 4.5 cm. Severe aortic atherosclerosis. Mediastinum/Nodes: No enlarged mediastinal, hilar, or axillary lymph nodes. Thyroid  gland, trachea, and esophagus demonstrate no significant findings.  Lungs/Pleura: Moderate bilateral pleural effusions and associated atelectasis or consolidation. Extensive heterogeneous and ground-glass airspace disease throughout the right  upper lobe (series 7, image 32). Diffuse interlobular septal thickening throughout both lungs. Musculoskeletal: No chest wall abnormality. No acute osseous findings. Review of the MIP images confirms the above findings. CT ABDOMEN PELVIS FINDINGS Hepatobiliary: No solid liver abnormality is seen. No gallstones, gallbladder wall thickening, or biliary dilatation. Pancreas: Unremarkable. No pancreatic ductal dilatation or surrounding inflammatory changes. Spleen: Normal in size without significant abnormality. Adrenals/Urinary Tract: Adrenal glands are unremarkable. Severely atrophic cystic kidneys, consistent with chronic renal disease and dialysis. Enhancing mass arising from the inferior pole of the right kidney measuring 2.5 x 2.4 cm (series 12, image 23). Appearance of the posterior midportion of the left kidney is suspicious for a second more irregular mass measuring 2.4 x 1.7 cm (series 12, image 22). Bladder is unremarkable. Stomach/Bowel: Stomach is within normal limits. Appendix not clearly visualized. No evidence of bowel wall thickening, distention, or inflammatory changes. Vascular/Lymphatic: Severe aortic atherosclerosis. Large-bore right femoral approach dialysis catheter. No enlarged abdominal or pelvic lymph nodes. Reproductive: Hysterectomy. Other: No abdominal wall hernia or abnormality. Trace free fluid in the low pelvis. Musculoskeletal: No acute or significant osseous findings. IMPRESSION: 1. Negative examination for pulmonary embolism. 2. Moderate bilateral pleural effusions and associated atelectasis or consolidation. Diffuse interlobular septal thickening throughout both lungs, findings consistent with pulmonary edema. 3. Extensive heterogeneous and ground-glass airspace disease throughout the right upper lobe, consistent with superimposed infection or aspiration. 4. Cardiomegaly and coronary artery disease. 5. Enlargement of the tubular ascending thoracic aorta measuring up to 4.6 x 4.5 cm.  Ascending thoracic aortic aneurysm. Recommend semi-annual imaging followup by CTA or MRA and referral to cardiothoracic surgery if not already obtained and if clinically appropriate. This recommendation follows 2010 ACCF/AHA/AATS/ACR/ASA/SCA/SCAI/SIR/STS/SVM Guidelines for the Diagnosis and Management of Patients With Thoracic Aortic Disease. Circulation. 2010; 121: Z733-z630. Aortic aneurysm NOS (ICD10-I71.9) 6. Severely atrophic cystic kidneys, consistent with chronic renal disease and dialysis. 7. Enhancing mass arising from the inferior pole of the right kidney measuring 2.5 x 2.4 cm, consistent with renal cell carcinoma. Appearance of the posterior midportion of the left kidney is suspicious for a second more irregular mass measuring 2.4 x 1.7 cm. Recommend multiphasic contrast enhanced CT or MRI on a nonemergent, outpatient basis for more confident assessment. Aortic Atherosclerosis (ICD10-I70.0). Electronically Signed   By: Marolyn JONETTA Jaksch M.D.   On: 07/13/2023 08:47   CT ABDOMEN PELVIS W CONTRAST Result Date: 07/13/2023 CLINICAL DATA:  Chest and abdominal pain for 2-4 weeks, hypoxia, dialysis * Tracking Code: BO * EXAM: CT ANGIOGRAPHY CHEST CT ABDOMEN AND PELVIS WITH CONTRAST TECHNIQUE: Multidetector CT imaging of the chest was performed using the standard protocol during bolus administration of intravenous contrast. Multiplanar CT image reconstructions and MIPs were obtained to evaluate the vascular anatomy. Multidetector CT imaging of the abdomen and pelvis was performed using the standard protocol during bolus administration of intravenous contrast. RADIATION DOSE REDUCTION: This exam was performed according to the departmental dose-optimization program which includes automated exposure control, adjustment of the mA and/or kV according to patient size and/or use of iterative reconstruction technique. CONTRAST:  OMNIPAQUE  IOHEXOL  350 MG/ML SOLN COMPARISON:  12/05/2021 FINDINGS: CT CHEST ANGIOGRAM  FINDINGS Cardiovascular: Satisfactory opacification of the pulmonary arteries to the segmental level. No evidence of pulmonary embolism. Cardiomegaly. Left and right coronary artery calcifications. No pericardial effusion. Enlargement of the tubular ascending thoracic aorta measuring up to 4.6 x 4.5  cm. Severe aortic atherosclerosis. Mediastinum/Nodes: No enlarged mediastinal, hilar, or axillary lymph nodes. Thyroid  gland, trachea, and esophagus demonstrate no significant findings. Lungs/Pleura: Moderate bilateral pleural effusions and associated atelectasis or consolidation. Extensive heterogeneous and ground-glass airspace disease throughout the right upper lobe (series 7, image 32). Diffuse interlobular septal thickening throughout both lungs. Musculoskeletal: No chest wall abnormality. No acute osseous findings. Review of the MIP images confirms the above findings. CT ABDOMEN PELVIS FINDINGS Hepatobiliary: No solid liver abnormality is seen. No gallstones, gallbladder wall thickening, or biliary dilatation. Pancreas: Unremarkable. No pancreatic ductal dilatation or surrounding inflammatory changes. Spleen: Normal in size without significant abnormality. Adrenals/Urinary Tract: Adrenal glands are unremarkable. Severely atrophic cystic kidneys, consistent with chronic renal disease and dialysis. Enhancing mass arising from the inferior pole of the right kidney measuring 2.5 x 2.4 cm (series 12, image 23). Appearance of the posterior midportion of the left kidney is suspicious for a second more irregular mass measuring 2.4 x 1.7 cm (series 12, image 22). Bladder is unremarkable. Stomach/Bowel: Stomach is within normal limits. Appendix not clearly visualized. No evidence of bowel wall thickening, distention, or inflammatory changes. Vascular/Lymphatic: Severe aortic atherosclerosis. Large-bore right femoral approach dialysis catheter. No enlarged abdominal or pelvic lymph nodes. Reproductive: Hysterectomy. Other: No  abdominal wall hernia or abnormality. Trace free fluid in the low pelvis. Musculoskeletal: No acute or significant osseous findings. IMPRESSION: 1. Negative examination for pulmonary embolism. 2. Moderate bilateral pleural effusions and associated atelectasis or consolidation. Diffuse interlobular septal thickening throughout both lungs, findings consistent with pulmonary edema. 3. Extensive heterogeneous and ground-glass airspace disease throughout the right upper lobe, consistent with superimposed infection or aspiration. 4. Cardiomegaly and coronary artery disease. 5. Enlargement of the tubular ascending thoracic aorta measuring up to 4.6 x 4.5 cm. Ascending thoracic aortic aneurysm. Recommend semi-annual imaging followup by CTA or MRA and referral to cardiothoracic surgery if not already obtained and if clinically appropriate. This recommendation follows 2010 ACCF/AHA/AATS/ACR/ASA/SCA/SCAI/SIR/STS/SVM Guidelines for the Diagnosis and Management of Patients With Thoracic Aortic Disease. Circulation. 2010; 121: [id number]. Aortic aneurysm NOS (ICD10-I71.9) 6. Severely atrophic cystic kidneys, consistent with chronic renal disease and dialysis. 7. Enhancing mass arising from the inferior pole of the right kidney measuring 2.5 x 2.4 cm, consistent with renal cell carcinoma. Appearance of the posterior midportion of the left kidney is suspicious for a second more irregular mass measuring 2.4 x 1.7 cm. Recommend multiphasic contrast enhanced CT or MRI on a nonemergent, outpatient basis for more confident assessment. Aortic Atherosclerosis (ICD10-I70.0). Electronically Signed   By: Marolyn JONETTA Jaksch M.D.   On: 07/13/2023 08:47   DG Chest Portable 1 View Result Date: 07/13/2023 CLINICAL DATA:  Hypoxia.  Abdominal pain and chest pain. EXAM: PORTABLE CHEST 1 VIEW COMPARISON:  11/30/2021 FINDINGS: Stable cardiomediastinal contours. Aortic atherosclerosis. Small bilateral pleural effusions. Diffuse increase interstitial  markings. No consolidative change. No signs of pneumothorax. No acute osseous findings. IMPRESSION: Small bilateral pleural effusions and diffuse increase interstitial markings compatible with pulmonary edema. Aortic Atherosclerosis (ICD10-I70.0). Electronically Signed   By: Waddell Calk M.D.   On: 07/13/2023 07:04     Medications:    lactated ringers  150 mL/hr at 07/13/23 0946   vancomycin  1,000 mg (07/13/23 1126)    Chlorhexidine  Gluconate Cloth  6 each Topical Q0600   heparin   5,000 Units Subcutaneous Q8H   insulin  aspart  0-6 Units Subcutaneous TID WC     Assessment/ Plan:  [name] is a 68 y.o.  female with end stage renal disease on  hemodialysis, hypertension, hyperlipidemia, congestive heart failure, atrial fibrillation, gout, and DVT who is admitted to [facility] on 07/13/2023 for Renal mass [N28.89] Acute respiratory failure with hypoxia (HCC) [J96.01] Pneumonia of right lung due to infectious organism, unspecified part of lung [J18.9] Acute hypoxic respiratory failure (HCC) [J96.01]  [facility] MWF [facility] Right femoral catheter 55kg.   End Stage Renal Disease: 2kg above target weight and acute pulmonary failure.  - emergent hemodialysis treatment today. - then resume MWF schedule.   Acute Respiratory failure: requiring [place] O2. With pulmonary edema and right upper lobe pneumonia.  - empiric antibiotics - supportive care with O2.   Hypertension with chronic kidney disease: - recommend restarting home medications after dialysis.  - home regimen of isosorbide  mononitrate, Entresto , and carvedilol   Secondary Hyperparathyroidism with hypercalcemia.  - receives etelcalcetide as outpatient.  - sevelamer  with meals.   Renal mass: right. Consistent with RCC. Incidental finding.  - Consult urology for outpatient follow up.  - hold esa.     LOS: 0 Graycen Degan 7/6/202511:52 AM

## 2023-07-13 NOTE — Progress Notes (Signed)
 Hemodialysis Note:  Received patient in bed to unit. Alert and oriented. Informed consent singed and in chart.  Treatment initiated: 1200 Treatment completed: 1511  Access used: Right Femoral catheter Access issues: None  Patient tolerated well. Transported back to room, alert without acute distress. Report given to patient's RN.  Total UF removed: 2.5 liters Medications given: None  Post HD weight: unable to get weight, bed scale not working  Ozell Jub[facility]

## 2023-07-13 NOTE — Assessment & Plan Note (Signed)
 The patient is saturating 100 % on BIPAP with 40% FIO2 and 15L of flow. This is due to pulmonary edema. She is not usually on oxygen.

## 2023-07-13 NOTE — ED Triage Notes (Signed)
 Pt arrived via EMS from home for abdominal pain and chest pain going on for 2-4 weeks. Pt has dialysis MWF. Pt was found to be hypoxic

## 2023-07-13 NOTE — Assessment & Plan Note (Signed)
 Noted. The patient has an increased calcium  today of 10.6. She receives sevelamer  with meals and etelcalcetide as outpatient.

## 2023-07-13 NOTE — ED Notes (Signed)
 Sunquest down at this time. Lab is aware. Cultures and lactic sent down with chart labels. Called lab to let them know.

## 2023-07-13 NOTE — Progress Notes (Signed)
 Pharmacy Antibiotic Note  Teresa Cook is a 68 y.o. female admitted on 07/13/2023 with HCAP.  Provider placed orders for Abx (no pharmacy initial consult). Patient with hx of ESRD and HD on MWF. Per nephrology note - plan to resume same schedule as inpatient.   Plan: Cefepime  2gm BID , changed to cefepime  1gm daily Vancomycin  1gm IV q 48hrs, changed to Vancomycin  1250mg  IV x 1 load, then 500mg  on HD days.  Height: 5' 5 (165.1 cm) Weight: 57.2 kg (126 lb) IBW/kg (Calculated) : 57  Temp (24hrs), Avg:98.2 F (36.8 C), Min:97.9 F (36.6 C), Max:98.5 F (36.9 C)  Recent Labs  Lab 07/13/23 0645 07/13/23 0940  WBC 11.7*  --   CREATININE 7.62*  --   LATICACIDVEN  --  1.2    Estimated Creatinine Clearance: 6.3 mL/min (A) (by C-G formula based on SCr of 7.62 mg/dL (H)).    Allergies  Allergen Reactions   Amlodipine  Palpitations   Gabapentin  Other (See Comments)    Seizure   Hydrochlorothiazide     Other Reaction(s): gout   Atorvastatin      Other Reaction(s): myalgias   Lisinopril Cough   Adhesive [Tape] Itching    Silk tape is ok to use.    Antimicrobials this admission: Vancomycin  7/6 >>  Cefepime  7/6 >>  Microbiology results: 7/6: MRSA PCR - ordered  Thank you for allowing pharmacy to be a part of this patient's care.  Matyas Baisley Rodriguez-Guzman PharmD, BCPS 07/13/2023 5:10 PM

## 2023-07-13 NOTE — Assessment & Plan Note (Signed)
 Due to uncontrolled hypertension.

## 2023-07-13 NOTE — ED Notes (Signed)
 Pt taken to dialysis at this time.

## 2023-07-13 NOTE — ED Notes (Signed)
 Attempted to get blood work from IV. Iv no longer drawing back. Will stick pt again to get blood work.

## 2023-07-14 ENCOUNTER — Inpatient Hospital Stay

## 2023-07-14 ENCOUNTER — Encounter: Payer: Self-pay | Admitting: Internal Medicine

## 2023-07-14 ENCOUNTER — Other Ambulatory Visit: Payer: Self-pay

## 2023-07-14 ENCOUNTER — Other Ambulatory Visit: Payer: Self-pay | Admitting: Urology

## 2023-07-14 ENCOUNTER — Inpatient Hospital Stay: Admit: 2023-07-14

## 2023-07-14 DIAGNOSIS — J9601 Acute respiratory failure with hypoxia: Secondary | ICD-10-CM | POA: Diagnosis not present

## 2023-07-14 DIAGNOSIS — N2889 Other specified disorders of kidney and ureter: Secondary | ICD-10-CM

## 2023-07-14 LAB — MRSA NEXT GEN BY PCR, NASAL: MRSA by PCR Next Gen: NOT DETECTED

## 2023-07-14 LAB — GLUCOSE, CAPILLARY
Glucose-Capillary: 83 mg/dL (ref 70–99)
Glucose-Capillary: 123 mg/dL — ABNORMAL HIGH (ref 70–99)
Glucose-Capillary: 80 mg/dL (ref 70–99)
Glucose-Capillary: 135 mg/dL — ABNORMAL HIGH (ref 70–99)

## 2023-07-14 LAB — BLOOD GAS, VENOUS
Bicarbonate: 25.9 mmol/L (ref 20.0–28.0)
O2 Saturation: 39.9 mmol/L (ref 0.0–2.0)
Patient temperature: 37
Patient temperature: 39.9
pCO2, Ven: 48 mmHg (ref 44–60)
pH, Ven: 7.34 (ref 7.25–7.43)
pO2, Ven: <31 mmol/L — AB (ref 32–45)

## 2023-07-14 MED ORDER — ACETAMINOPHEN 325 MG PO TABS
ORAL_TABLET | ORAL | Status: AC
  Filled 2023-07-14: qty 2

## 2023-07-14 MED ORDER — ACETAMINOPHEN 325 MG PO TABS
650.0000 mg | ORAL_TABLET | Freq: Four times a day (QID) | ORAL | Status: DC | PRN
  Administered 2023-07-14 – 2023-07-15 (×3): 650 mg via ORAL
  Filled 2023-07-14 (×2): qty 2

## 2023-07-14 MED ORDER — ZOLPIDEM TARTRATE 5 MG PO TABS
5.0000 mg | ORAL_TABLET | Freq: Every day | ORAL | Status: DC
  Administered 2023-07-14 – 2023-07-18 (×4): 5 mg via ORAL
  Filled 2023-07-14 (×5): qty 1

## 2023-07-14 NOTE — Plan of Care (Signed)
   Problem: Fluid Volume: Goal: Ability to maintain a balanced intake and output will improve Outcome: Progressing   Problem: Metabolic: Goal: Ability to maintain appropriate glucose levels will improve Outcome: Progressing   Problem: Nutritional: Goal: Maintenance of adequate nutrition will improve Outcome: Progressing

## 2023-07-14 NOTE — Progress Notes (Signed)
  Received patient in bed to unit.   Informed consent signed and in chart.    TX duration:3.0     Transported by  Hand-off given to patient's nurse. Pt tolerated tx well.   Access used: Cath Access issues: none   Total UF removed: 1.5 kg Medication(s) given: Tylenol  650 mg Post HD VS: wnl Post HD weight: 52.5 kg     N. Quay Simkin LPN [facility]

## 2023-07-14 NOTE — Consult Note (Signed)
 [facility]  [phone] [phone]  ID: Teresa Cook OB: [birth year]  MR#: [id number]  RDW#:[id number]  Patient Care Team: [facility], [facility] as PCP - General (General Practice) Lorel Maxie LABOR, MD (Family Medicine) Fleeta Dorothyann CROME, MD as Surgeon (Surgery) Jacobo Evalene PARAS, MD as Medical Oncologist (Oncology)  CHIEF COMPLAINT: Bilateral kidney masses.  INTERVAL HISTORY: Patient is a 68 year old female with a distant history of stage IIa ER/PR breast cancer was recently admitted acute respiratory failure and hypertensive emergency.  Imaging incidentally noted bilateral renal masses suspicious for renal cell carcinoma that were not evident on CT scan on December 05, 2021.  She currently feels improved since admission but not back to baseline.  She has no neurologic complaints.  She denies any fevers.  She does not report any weight loss.  She has no further chest pain and denies cough or hemoptysis.  She continues to have chronic shortness of breath.  He has no nausea, vomiting, constipation, diarrhea.  Patient offers no further specific complaints today.  REVIEW OF SYSTEMS:   Review of Systems  Constitutional: Negative.  Negative for fever, malaise/fatigue and weight loss.  Respiratory:  Positive for shortness of breath. Negative for cough and hemoptysis.   Cardiovascular: Negative.  Negative for chest pain and leg swelling.  Skin: Negative.  Negative for rash.  Neurological: Negative.  Negative for dizziness, focal weakness, weakness and headaches.  Psychiatric/Behavioral: Negative.  The patient is not nervous/anxious.     As per HPI. Otherwise, a complete review of systems is negative.  PAST MEDICAL HISTORY: Past Medical History:  Diagnosis Date   Anemia    Anxiety    Breast cancer (HCC) 01/2016   bilateral   CHF (congestive heart failure) (HCC)    Chronic kidney disease    Depression    Dialysis patient [facility])     DVT (deep venous thrombosis) (HCC)    left leg   DVT (deep venous thrombosis) (HCC) 1985   right thigh   Dysrhythmia    Gout    Headache    HTN (hypertension)    Hypertension    Nausea vomiting and diarrhea    Parathyroid  abnormality (HCC)    Parathyroid  disease (HCC)    Pneumonia 12/2015   Psoriasis    Renal insufficiency    Sickle cell trait (HCC)    traits    PAST SURGICAL HISTORY: Past Surgical History:  Procedure Laterality Date   ABDOMINAL HYSTERECTOMY  1980   APPENDECTOMY     BREAST BIOPSY Left 10/28/2013   benign   BREAST EXCISIONAL BIOPSY Left 2002   benign   CORONARY ANGIOGRAPHY N/A 04/04/2020   Procedure: CORONARY ANGIOGRAPHY;  Surgeon: Darron Deatrice LABOR, MD;  Location: [facility];  Service: Cardiovascular;  Laterality: N/A;   CYSTOSCOPY N/A 12/06/2021   Procedure: CYSTOSCOPY;  Surgeon: Twylla Glendia BROCKS, MD;  Location: [facility];  Service: Urology;  Laterality: N/A;   DIALYSIS/PERMA CATHETER INSERTION N/A 02/27/2022   Procedure: DIALYSIS/PERMA CATHETER INSERTION;  Surgeon: Marea Selinda RAMAN, MD;  Location: [facility];  Service: Cardiovascular;  Laterality: N/A;   INSERTION OF DIALYSIS CATHETER  2014   LIPOMA EXCISION N/A 01/23/2016   Procedure: EXCISION LIPOMA;  Surgeon: Dorothyann CROME Fleeta, MD;  Location: [facility];  Service: General;  Laterality: N/A;   MASTECTOMY W/ SENTINEL NODE BIOPSY Bilateral 01/23/2016   Procedure: bilateral MASTECTOMY WITH  bilateral SENTINEL LYMPH NODE BIOPSY possible left axillary node dissection forehead lipoma removal;  Surgeon: Dorothyann LITTIE Husk, MD;  Location: [facility];  Service: General;  Laterality: Bilateral;   PARTIAL HYSTERECTOMY     PERIPHERAL VASCULAR CATHETERIZATION N/A 12/25/2015   Procedure: Dialysis/Perma Catheter Insertion;  Surgeon: Selinda GORMAN Gu, MD;  Location: [facility];  Service: Cardiovascular;  Laterality: N/A;   PERIPHERAL VASCULAR CATHETERIZATION Left 01/22/2016   Procedure: Dialysis/Perma  Catheter Insertion;  Surgeon: Selinda GORMAN Gu, MD;  Location: [facility];  Service: Cardiovascular;  Laterality: Left;   PERIPHERAL VASCULAR CATHETERIZATION N/A 01/26/2016   Procedure: Dialysis/Perma Catheter Insertion;  Surgeon: Cordella KANDICE Shawl, MD;  Location: [facility];  Service: Cardiovascular;  Laterality: N/A;   PORT-A-CATH REMOVAL N/A 12/20/2015   Procedure: REMOVAL PORT-A-CATH;  Surgeon: Dorothyann LITTIE Husk, MD;  Location: [facility];  Service: General;  Laterality: N/A;  left     PORTACATH PLACEMENT Left 08/21/2015   Procedure: INSERTION PORT-A-CATH;  Surgeon: Dorothyann LITTIE Husk, MD;  Location: [facility];  Service: General;  Laterality: Left;   REMOVAL OF A DIALYSIS CATHETER  2017   RENAL ANGIOGRAPHY Right 11/05/2021   Procedure: RENAL ANGIOGRAPHY;  Surgeon: Gu Selinda GORMAN, MD;  Location: [facility];  Service: Cardiovascular;  Laterality: Right;  with embolization   RIGHT HEART CATH N/A 04/04/2020   Procedure: RIGHT HEART CATH;  Surgeon: Darron Deatrice LABOR, MD;  Location: [facility];  Service: Cardiovascular;  Laterality: N/A;   URETEROSCOPY Bilateral 12/06/2021   Procedure: URETEROSCOPY;  Surgeon: Twylla Glendia BROCKS, MD;  Location: [facility];  Service: Urology;  Laterality: Bilateral;    FAMILY HISTORY: Family History  Problem Relation Age of Onset   Stroke Mother    CVA Mother    Hypertension Mother    Hypertension Father    Hypertension Sister    Diabetes Sister    Cancer Sister 36       Breast   Stroke Brother    Hypertension Brother    Breast cancer Maternal Aunt 59    ADVANCED DIRECTIVES (Y/N):  @ADVDIR @  HEALTH MAINTENANCE: Social History   Tobacco Use   Smoking status: Never   Smokeless tobacco: Never  Vaping Use   Vaping status: Never Used  Substance Use Topics   Alcohol use: No    Alcohol/week: 0.0 standard drinks of alcohol   Drug use: No     Colonoscopy:  PAP:  Bone density:  Lipid panel:  Allergies  Allergen  Reactions   Amlodipine  Palpitations   Gabapentin  Other (See Comments)    Seizure   Hydrochlorothiazide     Other Reaction(s): gout   Atorvastatin      Other Reaction(s): myalgias   Lisinopril Cough   Adhesive [Tape] Itching    Silk tape is ok to use.    Current Facility-Administered Medications  Medication Dose Route Frequency Provider Last Rate Last Admin   amiodarone  (PACERONE ) tablet 200 mg  200 mg Oral BID Swayze, Ava, DO   200 mg at 07/14/23 1037   apixaban  (ELIQUIS ) tablet 2.5 mg  2.5 mg Oral BID Swayze, Ava, DO   2.5 mg at 07/14/23 1037   azithromycin  (ZITHROMAX ) 500 mg in sodium chloride  0.9 % 250 mL IVPB  500 mg Intravenous Q24H Swayze, Ava, DO   Stopped at 07/14/23 0707   carvedilol  (COREG ) tablet 12.5 mg  12.5 mg Oral BID Swayze, Ava, DO   12.5 mg at 07/14/23 1037   ceFEPIme  (MAXIPIME ) 1 g in sodium chloride  0.9 % 100 mL IVPB  1 g Intravenous  Q24H Swayze, Ava, DO 200 mL/hr at 07/14/23 1131 1 g at 07/14/23 1131   Chlorhexidine  Gluconate Cloth 2 % PADS 6 each  6 each Topical Q0600 Kolluru, Sarath, MD       insulin  aspart (novoLOG ) injection 0-6 Units  0-6 Units Subcutaneous TID WC Swayze, Ava, DO       multivitamin (RENA-VIT) tablet 1 tablet  1 tablet Oral Daily Swayze, Ava, DO   1 tablet at 07/14/23 1233   pantoprazole  (PROTONIX ) EC tablet 40 mg  40 mg Oral Daily Swayze, Ava, DO   40 mg at 07/14/23 1037   sacubitril -valsartan  (ENTRESTO ) 24-26 mg per tablet  1 tablet Oral BID Swayze, Ava, DO   1 tablet at 07/14/23 1037   sertraline  (ZOLOFT ) tablet 150 mg  150 mg Oral Daily Swayze, Ava, DO   150 mg at 07/14/23 1037   traZODone  (DESYREL ) tablet 50 mg  50 mg Oral QHS Swayze, Ava, DO   50 mg at 07/13/23 2147   vancomycin  (VANCOREADY) IVPB 500 mg/100 mL  500 mg Intravenous Q M,W,F Swayze, Ava, DO       zolpidem  (AMBIEN ) tablet 5 mg  5 mg Oral QHS Jens Durand, MD        OBJECTIVE: Vitals:   07/14/23 0818 07/14/23 1130  BP: 128/78 (!) 147/89  Pulse: 73 84  Resp: 18 16  Temp:  98.6 F (37 C) 98.2 F (36.8 C)  SpO2: 100% 100%     Body mass index is 20.97 kg/m.    ECOG FS:1 - Symptomatic but completely ambulatory  General: Well-developed, well-nourished, no acute distress. Eyes: Pink conjunctiva, anicteric sclera. HEENT: Normocephalic, moist mucous membranes. Lungs: No audible wheezing or coughing. Heart: Regular rate and rhythm. Abdomen: Soft, nontender, no obvious distention. Musculoskeletal: No edema, cyanosis, or clubbing. Neuro: Alert, answering all questions appropriately. Cranial nerves grossly intact. Skin: No rashes or petechiae noted. Psych: Normal affect. Lymphatics: No cervical, calvicular, axillary or inguinal LAD.   LAB RESULTS:  Lab Results  Component Value Date   NA 136 07/13/2023   K 3.7 07/13/2023   CL 99 07/13/2023   CO2 21 (L) 07/13/2023   GLUCOSE 103 (H) 07/13/2023   BUN 29 (H) 07/13/2023   CREATININE 7.62 (H) 07/13/2023   CALCIUM  10.6 (H) 07/13/2023   PROT 7.1 07/13/2023   ALBUMIN  3.5 07/13/2023   AST 26 07/13/2023   ALT 25 07/13/2023   ALKPHOS 76 07/13/2023   BILITOT 0.9 07/13/2023   GFRNONAA 5 (L) 07/13/2023   GFRAA 5 (L) 02/29/2020    Lab Results  Component Value Date   WBC 11.7 (H) 07/13/2023   NEUTROABS 9.1 (H) 07/13/2023   HGB 10.3 (L) 07/13/2023   HCT 30.4 (L) 07/13/2023   MCV 85.2 07/13/2023   PLT 148 (L) 07/13/2023     STUDIES: DG ESOPHAGUS W SINGLE CM (SOL OR THIN BA) Result Date: 07/14/2023 CLINICAL DATA:  [id number] Dysphagia 6163 68 year old female. Endorsing worsening dysphagia with solids. Request is for esophagram for further evaluation EXAM: ESOPHAGUS/BARIUM SWALLOW/TABLET STUDY TECHNIQUE: Single contrast examination was performed using thin liquid barium. This exam was performed by Delon Beagle NP, and was supervised and interpreted by Dr. Thom Hall. FLUOROSCOPY: Radiation Exposure Index and estimated peak skin dose (PSD); Reference air kerma (RAK), 14.2 mGy. Kerma-area product (KAP), 292.1  uGy*m. COMPARISON:  Chest XR, 07/13/2023.  CTA CAP, 11/05/2021. FINDINGS: Swallowing: Appears normal. No vestibular penetration or aspiration seen. Pharynx: Unremarkable. Esophagus: Normal appearance. Esophageal motility: Mild esophageal dysmotility, with tertiary contractions. Otherwise,  within normal limits. Hiatal Hernia: None. Gastroesophageal reflux: None visualized. Ingested 13mm barium tablet: Passed normally Other: None. IMPRESSION: No masses, lesions or significant dysmotility. Electronically Signed   By: Thom Hall M.D.   On: 07/14/2023 10:55   CT Angio Chest PE W/Cm &/Or Wo Cm Result Date: 07/13/2023 CLINICAL DATA:  Chest and abdominal pain for 2-4 weeks, hypoxia, dialysis * Tracking Code: BO * EXAM: CT ANGIOGRAPHY CHEST CT ABDOMEN AND PELVIS WITH CONTRAST TECHNIQUE: Multidetector CT imaging of the chest was performed using the standard protocol during bolus administration of intravenous contrast. Multiplanar CT image reconstructions and MIPs were obtained to evaluate the vascular anatomy. Multidetector CT imaging of the abdomen and pelvis was performed using the standard protocol during bolus administration of intravenous contrast. RADIATION DOSE REDUCTION: This exam was performed according to the departmental dose-optimization program which includes automated exposure control, adjustment of the mA and/or kV according to patient size and/or use of iterative reconstruction technique. CONTRAST:  OMNIPAQUE  IOHEXOL  350 MG/ML SOLN COMPARISON:  12/05/2021 FINDINGS: CT CHEST ANGIOGRAM FINDINGS Cardiovascular: Satisfactory opacification of the pulmonary arteries to the segmental level. No evidence of pulmonary embolism. Cardiomegaly. Left and right coronary artery calcifications. No pericardial effusion. Enlargement of the tubular ascending thoracic aorta measuring up to 4.6 x 4.5 cm. Severe aortic atherosclerosis. Mediastinum/Nodes: No enlarged mediastinal, hilar, or axillary lymph nodes. Thyroid   gland, trachea, and esophagus demonstrate no significant findings. Lungs/Pleura: Moderate bilateral pleural effusions and associated atelectasis or consolidation. Extensive heterogeneous and ground-glass airspace disease throughout the right upper lobe (series 7, image 32). Diffuse interlobular septal thickening throughout both lungs. Musculoskeletal: No chest wall abnormality. No acute osseous findings. Review of the MIP images confirms the above findings. CT ABDOMEN PELVIS FINDINGS Hepatobiliary: No solid liver abnormality is seen. No gallstones, gallbladder wall thickening, or biliary dilatation. Pancreas: Unremarkable. No pancreatic ductal dilatation or surrounding inflammatory changes. Spleen: Normal in size without significant abnormality. Adrenals/Urinary Tract: Adrenal glands are unremarkable. Severely atrophic cystic kidneys, consistent with chronic renal disease and dialysis. Enhancing mass arising from the inferior pole of the right kidney measuring 2.5 x 2.4 cm (series 12, image 23). Appearance of the posterior midportion of the left kidney is suspicious for a second more irregular mass measuring 2.4 x 1.7 cm (series 12, image 22). Bladder is unremarkable. Stomach/Bowel: Stomach is within normal limits. Appendix not clearly visualized. No evidence of bowel wall thickening, distention, or inflammatory changes. Vascular/Lymphatic: Severe aortic atherosclerosis. Large-bore right femoral approach dialysis catheter. No enlarged abdominal or pelvic lymph nodes. Reproductive: Hysterectomy. Other: No abdominal wall hernia or abnormality. Trace free fluid in the low pelvis. Musculoskeletal: No acute or significant osseous findings. IMPRESSION: 1. Negative examination for pulmonary embolism. 2. Moderate bilateral pleural effusions and associated atelectasis or consolidation. Diffuse interlobular septal thickening throughout both lungs, findings consistent with pulmonary edema. 3. Extensive heterogeneous and  ground-glass airspace disease throughout the right upper lobe, consistent with superimposed infection or aspiration. 4. Cardiomegaly and coronary artery disease. 5. Enlargement of the tubular ascending thoracic aorta measuring up to 4.6 x 4.5 cm. Ascending thoracic aortic aneurysm. Recommend semi-annual imaging followup by CTA or MRA and referral to cardiothoracic surgery if not already obtained and if clinically appropriate. This recommendation follows 2010 ACCF/AHA/AATS/ACR/ASA/SCA/SCAI/SIR/STS/SVM Guidelines for the Diagnosis and Management of Patients With Thoracic Aortic Disease. Circulation. 2010; 121: Z733-z630. Aortic aneurysm NOS (ICD10-I71.9) 6. Severely atrophic cystic kidneys, consistent with chronic renal disease and dialysis. 7. Enhancing mass arising from the inferior pole of the right kidney measuring 2.5  x 2.4 cm, consistent with renal cell carcinoma. Appearance of the posterior midportion of the left kidney is suspicious for a second more irregular mass measuring 2.4 x 1.7 cm. Recommend multiphasic contrast enhanced CT or MRI on a nonemergent, outpatient basis for more confident assessment. Aortic Atherosclerosis (ICD10-I70.0). Electronically Signed   By: Marolyn JONETTA Jaksch M.D.   On: 07/13/2023 08:47   CT ABDOMEN PELVIS W CONTRAST Result Date: 07/13/2023 CLINICAL DATA:  Chest and abdominal pain for 2-4 weeks, hypoxia, dialysis * Tracking Code: BO * EXAM: CT ANGIOGRAPHY CHEST CT ABDOMEN AND PELVIS WITH CONTRAST TECHNIQUE: Multidetector CT imaging of the chest was performed using the standard protocol during bolus administration of intravenous contrast. Multiplanar CT image reconstructions and MIPs were obtained to evaluate the vascular anatomy. Multidetector CT imaging of the abdomen and pelvis was performed using the standard protocol during bolus administration of intravenous contrast. RADIATION DOSE REDUCTION: This exam was performed according to the departmental dose-optimization program which  includes automated exposure control, adjustment of the mA and/or kV according to patient size and/or use of iterative reconstruction technique. CONTRAST:  OMNIPAQUE  IOHEXOL  350 MG/ML SOLN COMPARISON:  12/05/2021 FINDINGS: CT CHEST ANGIOGRAM FINDINGS Cardiovascular: Satisfactory opacification of the pulmonary arteries to the segmental level. No evidence of pulmonary embolism. Cardiomegaly. Left and right coronary artery calcifications. No pericardial effusion. Enlargement of the tubular ascending thoracic aorta measuring up to 4.6 x 4.5 cm. Severe aortic atherosclerosis. Mediastinum/Nodes: No enlarged mediastinal, hilar, or axillary lymph nodes. Thyroid  gland, trachea, and esophagus demonstrate no significant findings. Lungs/Pleura: Moderate bilateral pleural effusions and associated atelectasis or consolidation. Extensive heterogeneous and ground-glass airspace disease throughout the right upper lobe (series 7, image 32). Diffuse interlobular septal thickening throughout both lungs. Musculoskeletal: No chest wall abnormality. No acute osseous findings. Review of the MIP images confirms the above findings. CT ABDOMEN PELVIS FINDINGS Hepatobiliary: No solid liver abnormality is seen. No gallstones, gallbladder wall thickening, or biliary dilatation. Pancreas: Unremarkable. No pancreatic ductal dilatation or surrounding inflammatory changes. Spleen: Normal in size without significant abnormality. Adrenals/Urinary Tract: Adrenal glands are unremarkable. Severely atrophic cystic kidneys, consistent with chronic renal disease and dialysis. Enhancing mass arising from the inferior pole of the right kidney measuring 2.5 x 2.4 cm (series 12, image 23). Appearance of the posterior midportion of the left kidney is suspicious for a second more irregular mass measuring 2.4 x 1.7 cm (series 12, image 22). Bladder is unremarkable. Stomach/Bowel: Stomach is within normal limits. Appendix not clearly visualized. No evidence of  bowel wall thickening, distention, or inflammatory changes. Vascular/Lymphatic: Severe aortic atherosclerosis. Large-bore right femoral approach dialysis catheter. No enlarged abdominal or pelvic lymph nodes. Reproductive: Hysterectomy. Other: No abdominal wall hernia or abnormality. Trace free fluid in the low pelvis. Musculoskeletal: No acute or significant osseous findings. IMPRESSION: 1. Negative examination for pulmonary embolism. 2. Moderate bilateral pleural effusions and associated atelectasis or consolidation. Diffuse interlobular septal thickening throughout both lungs, findings consistent with pulmonary edema. 3. Extensive heterogeneous and ground-glass airspace disease throughout the right upper lobe, consistent with superimposed infection or aspiration. 4. Cardiomegaly and coronary artery disease. 5. Enlargement of the tubular ascending thoracic aorta measuring up to 4.6 x 4.5 cm. Ascending thoracic aortic aneurysm. Recommend semi-annual imaging followup by CTA or MRA and referral to cardiothoracic surgery if not already obtained and if clinically appropriate. This recommendation follows 2010 ACCF/AHA/AATS/ACR/ASA/SCA/SCAI/SIR/STS/SVM Guidelines for the Diagnosis and Management of Patients With Thoracic Aortic Disease. Circulation. 2010; 121: [id number]. Aortic aneurysm NOS (ICD10-I71.9) 6. Severely atrophic cystic kidneys,  consistent with chronic renal disease and dialysis. 7. Enhancing mass arising from the inferior pole of the right kidney measuring 2.5 x 2.4 cm, consistent with renal cell carcinoma. Appearance of the posterior midportion of the left kidney is suspicious for a second more irregular mass measuring 2.4 x 1.7 cm. Recommend multiphasic contrast enhanced CT or MRI on a nonemergent, outpatient basis for more confident assessment. Aortic Atherosclerosis (ICD10-I70.0). Electronically Signed   By: Marolyn JONETTA Jaksch M.D.   On: 07/13/2023 08:47   DG Chest Portable 1 View Result Date:  07/13/2023 CLINICAL DATA:  Hypoxia.  Abdominal pain and chest pain. EXAM: PORTABLE CHEST 1 VIEW COMPARISON:  11/30/2021 FINDINGS: Stable cardiomediastinal contours. Aortic atherosclerosis. Small bilateral pleural effusions. Diffuse increase interstitial markings. No consolidative change. No signs of pneumothorax. No acute osseous findings. IMPRESSION: Small bilateral pleural effusions and diffuse increase interstitial markings compatible with pulmonary edema. Aortic Atherosclerosis (ICD10-I70.0). Electronically Signed   By: Waddell Calk M.D.   On: 07/13/2023 07:04    ASSESSMENT: Bilateral kidney masses.  PLAN:    Bilateral kidney masses: Highly suspicious for underlying renal cell carcinoma.  Most likely, 2 separate primaries.  These lesions were not reported on CT scan from November 2023.  Case discussed with both urology and hospitalist and plan is to do MRI of the abdomen as an outpatient and then follow-up with urology to discuss treatment options.  Patient will then follow-up in the cancer center after her urology appointment.  No further intervention is needed at this time. History of breast cancer: Patient completed her neoadjuvant chemotherapy with Taxotere  and Cytoxan  on October 26, 2015. Her surgery was delayed secondary to multiple hospital admissions, but she ultimately proceeded with a double mastectomy, which was done on January 23, 2016. Because of this, she did not require XRT.  Patient refused hormonal therapy with letrozole .   End-stage renal disease: Continue dialysis as per nephrology.   Acute respiratory failure: Multifactorial.  Patient improving.   Appreciate consult, call with questions.   Evalene JINNY Reusing, MD   07/14/2023 12:44 PM

## 2023-07-14 NOTE — Consult Note (Signed)
 Urology Consult  I have been asked to see the patient by Dr. Sarath Kollura, for evaluation and management of bilateral renal masses.  Chief Complaint: Chest and abdominal pain for the last 2 to 3 weeks and hypoxia.  History of Present Illness: Teresa Cook is a 67 y.o. year old woman with a past medical history significant for ESRD on HD, anemia secondary to ESRD, anxiety, CHF, depression, DVT right thigh left leg, dysrhythmia, bilateral breast cancer, hypertension, gout, parathyroid  disease and sickle cell trait who presented to the ED on July 13, 2023 with complaints of chest pressure and shortness of breath.   She is also having difficulty swallowing for the last couple weeks with a feeling that all consistencies of food getting stuck in her esophagus and many times coming back up.  CT scan performed in the ED noted an enhancing mass arising from the inferior pole of the right kidney measuring 2.5 x 2.4 cm and an irregular mass measuring 2.4 x 1.7 cm arising out of the posterior midportion of the left kidney.  Both lesions are suspicious for renal cell carcinoma.  A CT scan performed yesterday noted similar findings.  She no longer makes urine, so she does not have any history of gross hematuria.  She denied any history of flank pain.  We had seen her in 2023 for a large spontaneous retroperitoneal bleed secondary to renal artery which was managed with embolization of the right kidney.  After the procedure she developed urethral bleeding and passage of blood per urethra.  She underwent cystoscopy with bilateral ureteroscopy on December 06, 2021 with Dr. Twylla.   Findings were consistent with old blood which is most likely secondary to the prior embolization of the right kidney and she was [facility] for discharge.  VSS afebrile, sating 100% on 4L/min nasal canula  She is feeling better today.  She does not have any flank pain.   Past Medical History:  Diagnosis Date   Anemia    Anxiety     Breast cancer (HCC) 01/2016   bilateral   CHF (congestive heart failure) (HCC)    Chronic kidney disease    Depression    Dialysis patient [facility])    DVT (deep venous thrombosis) (HCC)    left leg   DVT (deep venous thrombosis) (HCC) 1985   right thigh   Dysrhythmia    Gout    Headache    HTN (hypertension)    Hypertension    Nausea vomiting and diarrhea    Parathyroid  abnormality (HCC)    Parathyroid  disease (HCC)    Pneumonia 12/2015   Psoriasis    Renal insufficiency    Sickle cell trait (HCC)    traits    Past Surgical History:  Procedure Laterality Date   ABDOMINAL HYSTERECTOMY  1980   APPENDECTOMY     BREAST BIOPSY Left 10/28/2013   benign   BREAST EXCISIONAL BIOPSY Left 2002   benign   CORONARY ANGIOGRAPHY N/A 04/04/2020   Procedure: CORONARY ANGIOGRAPHY;  Surgeon: Darron Deatrice LABOR, MD;  Location: [facility];  Service: Cardiovascular;  Laterality: N/A;   CYSTOSCOPY N/A 12/06/2021   Procedure: CYSTOSCOPY;  Surgeon: Twylla Glendia BROCKS, MD;  Location: [facility] ORS;  Service: Urology;  Laterality: N/A;   DIALYSIS/PERMA CATHETER INSERTION N/A 02/27/2022   Procedure: DIALYSIS/PERMA CATHETER INSERTION;  Surgeon: Marea Selinda RAMAN, MD;  Location: [facility];  Service: Cardiovascular;  Laterality: N/A;   INSERTION OF DIALYSIS CATHETER  2014  LIPOMA EXCISION N/A 01/23/2016   Procedure: EXCISION LIPOMA;  Surgeon: Dorothyann LITTIE Husk, MD;  Location: [facility] ORS;  Service: General;  Laterality: N/A;   MASTECTOMY W/ SENTINEL NODE BIOPSY Bilateral 01/23/2016   Procedure: bilateral MASTECTOMY WITH  bilateral SENTINEL LYMPH NODE BIOPSY possible left axillary node dissection forehead lipoma removal;  Surgeon: Dorothyann LITTIE Husk, MD;  Location: [facility] OR[facility];  Service: General;  Laterality: Bilateral;   PARTIAL HYSTERECTOMY     PERIPHERAL VASCULAR CATHETERIZATION N/A 12/25/2015   Procedure: Dialysis/Perma Catheter Insertion;  Surgeon: Selinda GORMAN Gu, MD;  Location: [facility];  Service: Cardiovascular;  Laterality: N/A;   PERIPHERAL VASCULAR CATHETERIZATION Left 01/22/2016   Procedure: Dialysis/Perma Catheter Insertion;  Surgeon: Selinda GORMAN Gu, MD;  Location: [facility];  Service: Cardiovascular;  Laterality: Left;   PERIPHERAL VASCULAR CATHETERIZATION N/A 01/26/2016   Procedure: Dialysis/Perma Catheter Insertion;  Surgeon: Cordella KANDICE Shawl, MD;  Location: [facility];  Service: Cardiovascular;  Laterality: N/A;   PORT-A-CATH REMOVAL N/A 12/20/2015   Procedure: REMOVAL PORT-A-CATH;  Surgeon: Dorothyann LITTIE Husk, MD;  Location: [facility];  Service: General;  Laterality: N/A;  left     PORTACATH PLACEMENT Left 08/21/2015   Procedure: INSERTION PORT-A-CATH;  Surgeon: Dorothyann LITTIE Husk, MD;  Location: [facility];  Service: General;  Laterality: Left;   REMOVAL OF A DIALYSIS CATHETER  2017   RENAL ANGIOGRAPHY Right 11/05/2021   Procedure: RENAL ANGIOGRAPHY;  Surgeon: Gu Selinda GORMAN, MD;  Location: [facility];  Service: Cardiovascular;  Laterality: Right;  with embolization   RIGHT HEART CATH N/A 04/04/2020   Procedure: RIGHT HEART CATH;  Surgeon: Darron Deatrice LABOR, MD;  Location: [facility];  Service: Cardiovascular;  Laterality: N/A;   URETEROSCOPY Bilateral 12/06/2021   Procedure: URETEROSCOPY;  Surgeon: Twylla Glendia BROCKS, MD;  Location: [facility];  Service: Urology;  Laterality: Bilateral;    Home Medications:  No current facility-administered medications on file prior to encounter.   Current Outpatient Medications on File Prior to Encounter  Medication Sig Dispense Refill   amiodarone  (PACERONE ) 200 MG tablet Take 1 tablet (200 mg total) by mouth 2 (two) times daily. 60 tablet 1   apixaban  (ELIQUIS ) 2.5 MG TABS tablet Take 1 tablet (2.5 mg total) by mouth 2 (two) times daily. 60 tablet 0   b complex-vitamin c-folic acid  (NEPHRO-VITE) 0.8 MG TABS tablet Take 1 tablet by mouth daily.     carvedilol  (COREG ) 12.5 MG tablet Take 12.5 mg  by mouth 2 (two) times daily.     midodrine (PROAMATINE) 10 MG tablet Take 10 mg by mouth every Monday, Wednesday, and Friday with hemodialysis.     pantoprazole  (PROTONIX ) 40 MG tablet Take 1 tablet (40 mg total) by mouth 2 (two) times daily. (Patient taking differently: Take 40 mg by mouth daily.) 60 tablet 0   sacubitril -valsartan  (ENTRESTO ) 24-26 MG Take 1 tablet by mouth 2 (two) times daily. 180 tablet 3   sertraline  (ZOLOFT ) 100 MG tablet Take 150 mg by mouth daily.     traZODone  (DESYREL ) 50 MG tablet Take 50 mg by mouth at bedtime.     zolpidem  (AMBIEN ) 10 MG tablet Take 0.5 tablets (5 mg total) by mouth at bedtime. (Patient taking differently: Take 10 mg by mouth at bedtime.) 30 tablet 0     Allergies:  Allergies  Allergen Reactions   Amlodipine  Palpitations   Gabapentin  Other (See Comments)    Seizure   Hydrochlorothiazide  Other Reaction(s): gout   Atorvastatin      Other Reaction(s): myalgias   Lisinopril Cough   Adhesive [Tape] Itching    Silk tape is ok to use.    Family History  Problem Relation Age of Onset   Stroke Mother    CVA Mother    Hypertension Mother    Hypertension Father    Hypertension Sister    Diabetes Sister    Cancer Sister 48       Breast   Stroke Brother    Hypertension Brother    Breast cancer Maternal Aunt 85    Social History:  reports that she has never smoked. She has never used smokeless tobacco. She reports that she does not drink alcohol and does not use drugs.  ROS: A complete review of systems was performed.  All systems are negative except for pertinent findings as noted.  Physical Exam:  Vital signs in last 24 hours: Temp:  [97.9 F (36.6 C)-98.6 F (37 C)] 98.2 F (36.8 C) (07/07 1130) Pulse Rate:  [64-89] 84 (07/07 1130) Resp:  [15-26] 16 (07/07 1130) BP: (96-168)/(56-103) 147/89 (07/07 1130) SpO2:  [99 %-100 %] 100 % (07/07 1130) Constitutional:  Alert and oriented, No acute distress HEENT:  AT, moist mucus  membranes.  Trachea midline,  Cardiovascular: no clubbing, cyanosis, or edema. Respiratory: Normal respiratory effort, nasal cannula in place GI: Abdomen is soft, nontender, nondistended, no abdominal masses GU: No CVA tenderness Neurologic: Grossly intact, no focal deficits, moving all 4 extremities Psychiatric: Normal mood and affect  Laboratory Data:  Recent Labs    07/13/23 0645  WBC 11.7*  HGB 10.3*  HCT 30.4*   Recent Labs    07/13/23 0645  NA 136  K 3.7  CL 99  CO2 21*  GLUCOSE 103*  BUN 29*  CREATININE 7.62*  CALCIUM  10.6*   No results for input(s): LABPT, INR in the last 72 hours. No results for input(s): LABURIN in the last 72 hours. Results for orders placed or performed during the hospital encounter of 07/13/23  Resp panel by RT-PCR (RSV, Flu A&B, Covid) Anterior Nasal Swab     Status: None   Collection Time: 07/13/23  6:45 AM   Specimen: Anterior Nasal Swab  Result Value Ref Range Status   SARS Coronavirus 2 by RT PCR NEGATIVE NEGATIVE Final    Comment: (NOTE) SARS-CoV-2 target nucleic acids are NOT DETECTED.  The SARS-CoV-2 RNA is generally detectable in upper respiratory specimens during the acute phase of infection. The lowest concentration of SARS-CoV-2 viral copies this assay can detect is 138 copies/mL. A negative result does not preclude SARS-Cov-2 infection and should not be used as the sole basis for treatment or other patient management decisions. A negative result may occur with  improper specimen collection/handling, submission of specimen other than nasopharyngeal swab, presence of viral mutation(s) within the areas targeted by this assay, and inadequate number of viral copies(<138 copies/mL). A negative result must be combined with clinical observations, patient history, and epidemiological information. The expected result is Negative.  Fact Sheet for Patients:  BloggerCourse.com  Fact Sheet for Healthcare  Providers:  SeriousBroker.it  This test is no t yet approved or cleared by the [place]  FDA and  has been authorized for detection and/or diagnosis of SARS-CoV-2 by FDA under an Emergency Use Authorization (EUA). This EUA will remain  in effect (meaning this test can be used) for the duration of the COVID-19 declaration under Section 564(b)(1) of the Act, 21  U.S.C.section 360bbb-3(b)(1), unless the authorization is terminated  or revoked sooner.       Influenza A by PCR NEGATIVE NEGATIVE Final   Influenza B by PCR NEGATIVE NEGATIVE Final    Comment: (NOTE) The Xpert Xpress SARS-CoV-2/FLU/RSV plus assay is intended as an aid in the diagnosis of influenza from Nasopharyngeal swab specimens and should not be used as a sole basis for treatment. Nasal washings and aspirates are unacceptable for Xpert Xpress SARS-CoV-2/FLU/RSV testing.  Fact Sheet for Patients: BloggerCourse.com  Fact Sheet for Healthcare Providers: SeriousBroker.it  This test is not yet approved or cleared by the [place]  FDA and has been authorized for detection and/or diagnosis of SARS-CoV-2 by FDA under an Emergency Use Authorization (EUA). This EUA will remain in effect (meaning this test can be used) for the duration of the COVID-19 declaration under Section 564(b)(1) of the Act, 21 U.S.C. section 360bbb-3(b)(1), unless the authorization is terminated or revoked.     Resp Syncytial Virus by PCR NEGATIVE NEGATIVE Final    Comment: (NOTE) Fact Sheet for Patients: BloggerCourse.com  Fact Sheet for Healthcare Providers: SeriousBroker.it  This test is not yet approved or cleared by the [place]  FDA and has been authorized for detection and/or diagnosis of SARS-CoV-2 by FDA under an Emergency Use Authorization (EUA). This EUA will remain in effect (meaning this test can be  used) for the duration of the COVID-19 declaration under Section 564(b)(1) of the Act, 21 U.S.C. section 360bbb-3(b)(1), unless the authorization is terminated or revoked.  Performed at [facility], [address] [place], [place] [address]   Blood culture (routine x 2)     Status: None (Preliminary result)   Collection Time: 07/13/23  9:30 AM   Specimen: BLOOD  Result Value Ref Range Status   Specimen Description BLOOD BLOOD LEFT FOREARM  Final   Special Requests   Final    BOTTLES DRAWN AEROBIC AND ANAEROBIC Blood Culture results may not be optimal due to an inadequate volume of blood received in culture bottles   Culture   Final    NO GROWTH < 24 HOURS Performed at [facility], [address], [place], [place] [address]    Report Status PENDING  Incomplete  Blood culture (routine x 2)     Status: None (Preliminary result)   Collection Time: 07/13/23  9:40 AM   Specimen: BLOOD  Result Value Ref Range Status   Specimen Description BLOOD BLOOD RIGHT HAND  Final   Special Requests   Final    BOTTLES DRAWN AEROBIC AND ANAEROBIC Blood Culture results may not be optimal due to an inadequate volume of blood received in culture bottles   Culture   Final    NO GROWTH < 24 HOURS Performed at [facility], [address] [place], [place] [address]    Report Status PENDING  Incomplete  MRSA Next Gen by PCR, Nasal     Status: None   Collection Time: 07/14/23  5:30 AM   Specimen: Nasal Mucosa; Nasal Swab  Result Value Ref Range Status   MRSA by PCR Next Gen NOT DETECTED NOT DETECTED Final    Comment: (NOTE) The GeneXpert MRSA Assay (FDA approved for NASAL specimens only), is one component of a comprehensive MRSA colonization surveillance program. It is not intended to diagnose MRSA infection nor to guide or monitor treatment for MRSA infections. Test performance is not FDA approved in patients less than 33 years old. Performed at [facility],  [address] [place], [place] [address]      Radiologic Imaging: DG ESOPHAGUS W SINGLE CM (SOL OR THIN BA) Result Date: 07/14/2023 CLINICAL DATA:  [id number] Dysphagia 1528 67 year old female. Endorsing worsening dysphagia with solids. Request is for esophagram for further evaluation EXAM: ESOPHAGUS/BARIUM SWALLOW/TABLET STUDY TECHNIQUE: Single contrast examination was performed using thin liquid barium. This exam was performed by Delon Beagle NP, and was supervised and interpreted by Dr. Thom Hall. FLUOROSCOPY: Radiation Exposure Index and estimated peak skin dose (PSD); Reference air kerma (RAK), 14.2 mGy. Kerma-area product (KAP), 292.1 uGy*m. COMPARISON:  Chest XR, 07/13/2023.  CTA CAP, 11/05/2021. FINDINGS: Swallowing: Appears normal. No vestibular penetration or aspiration seen. Pharynx: Unremarkable. Esophagus: Normal appearance. Esophageal motility: Mild esophageal dysmotility, with tertiary contractions. Otherwise, within normal limits. Hiatal Hernia: None. Gastroesophageal reflux: None visualized. Ingested 13mm barium tablet: Passed normally Other: None. IMPRESSION: No masses, lesions or significant dysmotility. Electronically Signed   By: Thom Hall M.D.   On: 07/14/2023 10:55   CT Angio Chest PE W/Cm &/Or Wo Cm Result Date: 07/13/2023 CLINICAL DATA:  Chest and abdominal pain for 2-4 weeks, hypoxia, dialysis * Tracking Code: BO * EXAM: CT ANGIOGRAPHY CHEST CT ABDOMEN AND PELVIS WITH CONTRAST TECHNIQUE: Multidetector CT imaging of the chest was performed using the standard protocol during bolus administration of intravenous contrast. Multiplanar CT image reconstructions and MIPs were obtained to evaluate the vascular anatomy. Multidetector CT imaging of the abdomen and pelvis was performed using the standard protocol during bolus administration of intravenous contrast. RADIATION DOSE REDUCTION: This exam was performed according to the departmental dose-optimization program which  includes automated exposure control, adjustment of the mA and/or kV according to patient size and/or use of iterative reconstruction technique. CONTRAST:  OMNIPAQUE  IOHEXOL  350 MG/ML SOLN COMPARISON:  12/05/2021 FINDINGS: CT CHEST ANGIOGRAM FINDINGS Cardiovascular: Satisfactory opacification of the pulmonary arteries to the segmental level. No evidence of pulmonary embolism. Cardiomegaly. Left and right coronary artery calcifications. No pericardial effusion. Enlargement of the tubular ascending thoracic aorta measuring up to 4.6 x 4.5 cm. Severe aortic atherosclerosis. Mediastinum/Nodes: No enlarged mediastinal, hilar, or axillary lymph nodes. Thyroid  gland, trachea, and esophagus demonstrate no significant findings. Lungs/Pleura: Moderate bilateral pleural effusions and associated atelectasis or consolidation. Extensive heterogeneous and ground-glass airspace disease throughout the right upper lobe (series 7, image 32). Diffuse interlobular septal thickening throughout both lungs. Musculoskeletal: No chest wall abnormality. No acute osseous findings. Review of the MIP images confirms the above findings. CT ABDOMEN PELVIS FINDINGS Hepatobiliary: No solid liver abnormality is seen. No gallstones, gallbladder wall thickening, or biliary dilatation. Pancreas: Unremarkable. No pancreatic ductal dilatation or surrounding inflammatory changes. Spleen: Normal in size without significant abnormality. Adrenals/Urinary Tract: Adrenal glands are unremarkable. Severely atrophic cystic kidneys, consistent with chronic renal disease and dialysis. Enhancing mass arising from the inferior pole of the right kidney measuring 2.5 x 2.4 cm (series 12, image 23). Appearance of the posterior midportion of the left kidney is suspicious for a second more irregular mass measuring 2.4 x 1.7 cm (series 12, image 22). Bladder is unremarkable. Stomach/Bowel: Stomach is within normal limits. Appendix not clearly visualized. No evidence of  bowel wall thickening, distention, or inflammatory changes. Vascular/Lymphatic: Severe aortic atherosclerosis. Large-bore right femoral approach dialysis catheter. No enlarged abdominal or pelvic lymph nodes. Reproductive: Hysterectomy. Other: No abdominal wall hernia or abnormality. Trace free fluid in the low pelvis. Musculoskeletal: No acute or significant osseous findings. IMPRESSION: 1. Negative examination for pulmonary embolism. 2. Moderate bilateral pleural effusions and associated atelectasis  or consolidation. Diffuse interlobular septal thickening throughout both lungs, findings consistent with pulmonary edema. 3. Extensive heterogeneous and ground-glass airspace disease throughout the right upper lobe, consistent with superimposed infection or aspiration. 4. Cardiomegaly and coronary artery disease. 5. Enlargement of the tubular ascending thoracic aorta measuring up to 4.6 x 4.5 cm. Ascending thoracic aortic aneurysm. Recommend semi-annual imaging followup by CTA or MRA and referral to cardiothoracic surgery if not already obtained and if clinically appropriate. This recommendation follows 2010 ACCF/AHA/AATS/ACR/ASA/SCA/SCAI/SIR/STS/SVM Guidelines for the Diagnosis and Management of Patients With Thoracic Aortic Disease. Circulation. 2010; 121: Z733-z630. Aortic aneurysm NOS (ICD10-I71.9) 6. Severely atrophic cystic kidneys, consistent with chronic renal disease and dialysis. 7. Enhancing mass arising from the inferior pole of the right kidney measuring 2.5 x 2.4 cm, consistent with renal cell carcinoma. Appearance of the posterior midportion of the left kidney is suspicious for a second more irregular mass measuring 2.4 x 1.7 cm. Recommend multiphasic contrast enhanced CT or MRI on a nonemergent, outpatient basis for more confident assessment. Aortic Atherosclerosis (ICD10-I70.0). Electronically Signed   By: Marolyn JONETTA Jaksch M.D.   On: 07/13/2023 08:47   CT ABDOMEN PELVIS W CONTRAST Result Date:  07/13/2023 CLINICAL DATA:  Chest and abdominal pain for 2-4 weeks, hypoxia, dialysis * Tracking Code: BO * EXAM: CT ANGIOGRAPHY CHEST CT ABDOMEN AND PELVIS WITH CONTRAST TECHNIQUE: Multidetector CT imaging of the chest was performed using the standard protocol during bolus administration of intravenous contrast. Multiplanar CT image reconstructions and MIPs were obtained to evaluate the vascular anatomy. Multidetector CT imaging of the abdomen and pelvis was performed using the standard protocol during bolus administration of intravenous contrast. RADIATION DOSE REDUCTION: This exam was performed according to the departmental dose-optimization program which includes automated exposure control, adjustment of the mA and/or kV according to patient size and/or use of iterative reconstruction technique. CONTRAST:  OMNIPAQUE  IOHEXOL  350 MG/ML SOLN COMPARISON:  12/05/2021 FINDINGS: CT CHEST ANGIOGRAM FINDINGS Cardiovascular: Satisfactory opacification of the pulmonary arteries to the segmental level. No evidence of pulmonary embolism. Cardiomegaly. Left and right coronary artery calcifications. No pericardial effusion. Enlargement of the tubular ascending thoracic aorta measuring up to 4.6 x 4.5 cm. Severe aortic atherosclerosis. Mediastinum/Nodes: No enlarged mediastinal, hilar, or axillary lymph nodes. Thyroid  gland, trachea, and esophagus demonstrate no significant findings. Lungs/Pleura: Moderate bilateral pleural effusions and associated atelectasis or consolidation. Extensive heterogeneous and ground-glass airspace disease throughout the right upper lobe (series 7, image 32). Diffuse interlobular septal thickening throughout both lungs. Musculoskeletal: No chest wall abnormality. No acute osseous findings. Review of the MIP images confirms the above findings. CT ABDOMEN PELVIS FINDINGS Hepatobiliary: No solid liver abnormality is seen. No gallstones, gallbladder wall thickening, or biliary dilatation. Pancreas:  Unremarkable. No pancreatic ductal dilatation or surrounding inflammatory changes. Spleen: Normal in size without significant abnormality. Adrenals/Urinary Tract: Adrenal glands are unremarkable. Severely atrophic cystic kidneys, consistent with chronic renal disease and dialysis. Enhancing mass arising from the inferior pole of the right kidney measuring 2.5 x 2.4 cm (series 12, image 23). Appearance of the posterior midportion of the left kidney is suspicious for a second more irregular mass measuring 2.4 x 1.7 cm (series 12, image 22). Bladder is unremarkable. Stomach/Bowel: Stomach is within normal limits. Appendix not clearly visualized. No evidence of bowel wall thickening, distention, or inflammatory changes. Vascular/Lymphatic: Severe aortic atherosclerosis. Large-bore right femoral approach dialysis catheter. No enlarged abdominal or pelvic lymph nodes. Reproductive: Hysterectomy. Other: No abdominal wall hernia or abnormality. Trace free fluid in the low pelvis. Musculoskeletal:  No acute or significant osseous findings. IMPRESSION: 1. Negative examination for pulmonary embolism. 2. Moderate bilateral pleural effusions and associated atelectasis or consolidation. Diffuse interlobular septal thickening throughout both lungs, findings consistent with pulmonary edema. 3. Extensive heterogeneous and ground-glass airspace disease throughout the right upper lobe, consistent with superimposed infection or aspiration. 4. Cardiomegaly and coronary artery disease. 5. Enlargement of the tubular ascending thoracic aorta measuring up to 4.6 x 4.5 cm. Ascending thoracic aortic aneurysm. Recommend semi-annual imaging followup by CTA or MRA and referral to cardiothoracic surgery if not already obtained and if clinically appropriate. This recommendation follows 2010 ACCF/AHA/AATS/ACR/ASA/SCA/SCAI/SIR/STS/SVM Guidelines for the Diagnosis and Management of Patients With Thoracic Aortic Disease. Circulation. 2010; 121:  [id number]. Aortic aneurysm NOS (ICD10-I71.9) 6. Severely atrophic cystic kidneys, consistent with chronic renal disease and dialysis. 7. Enhancing mass arising from the inferior pole of the right kidney measuring 2.5 x 2.4 cm, consistent with renal cell carcinoma. Appearance of the posterior midportion of the left kidney is suspicious for a second more irregular mass measuring 2.4 x 1.7 cm. Recommend multiphasic contrast enhanced CT or MRI on a nonemergent, outpatient basis for more confident assessment. Aortic Atherosclerosis (ICD10-I70.0). Electronically Signed   By: Marolyn JONETTA Jaksch M.D.   On: 07/13/2023 08:47   DG Chest Portable 1 View Result Date: 07/13/2023 CLINICAL DATA:  Hypoxia.  Abdominal pain and chest pain. EXAM: PORTABLE CHEST 1 VIEW COMPARISON:  11/30/2021 FINDINGS: Stable cardiomediastinal contours. Aortic atherosclerosis. Small bilateral pleural effusions. Diffuse increase interstitial markings. No consolidative change. No signs of pneumothorax. No acute osseous findings. IMPRESSION: Small bilateral pleural effusions and diffuse increase interstitial markings compatible with pulmonary edema. Aortic Atherosclerosis (ICD10-I70.0). Electronically Signed   By: Waddell Calk M.D.   On: 07/13/2023 07:04    Impression/Assessment:  68 year old woman with ESRD on dialysis who presented to the ED with complaints of chest pressure and shortness of breath.  A CT chest/abdomen/pelvis was performed to rule out PE and incidental finding of an enhancing mass measuring 2.5 x 2.4 cm from the inferior pole of the right kidney and another irregular mass measuring 2.4 x 1.7 cm arising from the posterior mid portion of the left kidney.  -We discussed that these masses raise the suspicion of a primary renal malignancy  -We discussed that based on a large retrospective studies that lesions that are 3 cm or less have a low risk of metastases and that in cases of older individuals or individuals with multiple  comorbidities surveillance of these masses would be appropriate  -We discussed other treatment options such as cryoablation, partial or complete nephrectomy and I offered a referral to [facility] to seek their opinion to whether or not these options would be appropriate given her age and co morbidities   Plan:  - she would like to pursue an MRI for further evaluation of these lesions prior to making her decision on how she wants to move forward  - she does not want a referral placed for [facility] at this time, but may change her mind pending MRI results - will pursue MRI and follow up on an outpatient basis - will make arrangement for both  07/14/2023, 12:34 PM   Price Lachapelle, PA-C

## 2023-07-14 NOTE — Progress Notes (Signed)
 Progress Note    Teresa Cook  FMW:[id number] DOB: August 09, 1955  DOA: 07/13/2023 PCP: [facility]      Brief Narrative:    Medical records reviewed and are as summarized below:  Teresa Cook is a 68 y.o. female with medical history significant for ESRD on HD, anemia secondary to ESRD, Anxiety, CHF, Depression, DVT right thigh and left leg, dysrhythmia, hypertension, gout, parathyroid  disease, sickle cell trait.  She presented to [facility] ED on 07/13/2023 with complaints of chest pressure and shortness of breath.  He also complains of difficulty swallowing all consistencies of food.  CTA chest and CT abdomen pelvis report as follows:  IMPRESSION: 1. Negative examination for pulmonary embolism. 2. Moderate bilateral pleural effusions and associated atelectasis or consolidation. Diffuse interlobular septal thickening throughout both lungs, findings consistent with pulmonary edema. 3. Extensive heterogeneous and ground-glass airspace disease throughout the right upper lobe, consistent with superimposed infection or aspiration. 4. Cardiomegaly and coronary artery disease. 5. Enlargement of the tubular ascending thoracic aorta measuring up to 4.6 x 4.5 cm. Ascending thoracic aortic aneurysm. Recommend semi-annual imaging followup by CTA or MRA and referral to cardiothoracic surgery if not already obtained and if clinically appropriate. This recommendation follows 2010 ACCF/AHA/AATS/ACR/ASA/SCA/SCAI/SIR/STS/SVM Guidelines for the Diagnosis and Management of Patients With Thoracic Aortic Disease. Circulation. 2010; 121: Z733-z630. Aortic aneurysm NOS (ICD10-I71.9) 6. Severely atrophic cystic kidneys, consistent with chronic renal disease and dialysis. 7. Enhancing mass arising from the inferior pole of the right kidney measuring 2.5 x 2.4 cm, consistent with renal cell carcinoma. Appearance of the posterior midportion of the left kidney is suspicious for a  second more irregular mass measuring 2.4 x 1.7 cm. Recommend multiphasic contrast enhanced CT or MRI on a nonemergent, outpatient basis for more confident assessment.   Aortic Atherosclerosis (ICD10-I70.0).        Assessment/Plan:   Principal Problem:   Acute respiratory failure with hypoxia (HCC) Active Problems:   Acute on chronic diastolic CHF (congestive heart failure) (HCC)   Hypertensive emergency   ESRD on dialysis (HCC)   Parathyroid  disease (HCC)   Hypercalcemia   Depression with anxiety   Acute pulmonary edema (HCC)   Chest pain   Dysphagia    Body mass index is 20.97 kg/m.   Acute on chronic diastolic CHF, moderate bilateral pleural effusions and associated atelectasis: Fluid management with hemodialysis.  Monitor BMP, daily weight. BNP 1,279 Last 2D echo in October 2023 showed preserved EF, grade 1 diastolic dysfunction. Repeat 2D echo is pending   Hypertensive emergency: BP is better.  Continue antihypertensives   ESRD on hemodialysis parathyroid  disease, hypercalcemia: Plan for hemodialysis today.  She is on MWF schedule.   Chest tightness: Likely due to acute lung disease. Evaded troponins: 40, 48.  This is likely due to demand ischemia   Acute hypoxic respiratory failure: She was initially treated with BiPAP.  She is now on 4 L/min oxygen via nasal cannula.  Wean off oxygen as able.  She was not on home oxygen.   Probable community-acquired pneumonia: Discontinue IV vancomycin  and IV cefepime .  Continue azithromycin  and add IV ceftriaxone .  Follow-up blood cultures.   Dysphagia: Barium swallow was normal.  Patient will need GI workup once acute issues resolved.   Bilateral renal masses concerning for renal cell carcinoma, he has been evaluated by urologist.  Outpatient MRI recommended for further evaluation.  She has also been seen by the oncologist, Dr. Jacobo, who will follow the patient in the  outpatient setting.   Ascending thoracic  aortic aneurysm 4.6 x 4.5 cm: Recommend semi-annual imaging followup by CTA or MRA and referral to cardiothoracic surgery as an outpatient    Comorbidities include history of DVT and paroxysmal atrial fibrillation on Eliquis , depression, parathyroid  disease, history of left breast cancer s/p mastectomy in 2018   Plan discussed with Dr. Jacobo, oncologist and Ms Helon, urology PA, at the bedside    Diet Order             Diet heart healthy/carb modified Room service appropriate? Yes; Fluid consistency: Thin  Diet effective now                            Consultants: Science writer  Procedures: None    Medications:    amiodarone   200 mg Oral BID   apixaban   2.5 mg Oral BID   carvedilol   12.5 mg Oral BID   Chlorhexidine  Gluconate Cloth  6 each Topical Q0600   insulin  aspart  0-6 Units Subcutaneous TID WC   multivitamin  1 tablet Oral Daily   pantoprazole   40 mg Oral Daily   sacubitril -valsartan   1 tablet Oral BID   sertraline   150 mg Oral Daily   traZODone   50 mg Oral QHS   zolpidem   5 mg Oral QHS   Continuous Infusions:  azithromycin  Stopped (07/14/23 0707)   ceFEPime  (MAXIPIME ) IV 1 g (07/14/23 1131)     Anti-infectives (From admission, onward)    Start     Dose/Rate Route Frequency Ordered Stop   07/14/23 1800  vancomycin  (VANCOREADY) IVPB 500 mg/100 mL  Status:  Discontinued        500 mg 100 mL/hr over 60 Minutes Intravenous Every M-W-F 07/13/23 1700 07/14/23 1249   07/14/23 1000  ceFEPIme  (MAXIPIME ) 1 g in sodium chloride  0.9 % 100 mL IVPB        1 g 200 mL/hr over 30 Minutes Intravenous Every 24 hours 07/13/23 1637     07/14/23 0700  azithromycin  (ZITHROMAX ) 500 mg in sodium chloride  0.9 % 250 mL IVPB        500 mg 250 mL/hr over 60 Minutes Intravenous Every 24 hours 07/13/23 1637     07/13/23 1700  vancomycin  (VANCOREADY) IVPB 1250 mg/250 mL        1,250 mg 166.7 mL/hr over 90 Minutes Intravenous  Once 07/13/23  1657 07/13/23 1929   07/13/23 1645  vancomycin  (VANCOCIN ) IVPB 1000 mg/200 mL premix  Status:  Discontinued        1,000 mg 200 mL/hr over 60 Minutes Intravenous Every 48 hours 07/13/23 1637 07/13/23 1657   07/13/23 0915  vancomycin  (VANCOCIN ) IVPB 1000 mg/200 mL premix        1,000 mg 200 mL/hr over 60 Minutes Intravenous  Once 07/13/23 0904 07/13/23 1226   07/13/23 0915  ceFEPIme  (MAXIPIME ) 2 g in sodium chloride  0.9 % 100 mL IVPB        2 g 200 mL/hr over 30 Minutes Intravenous  Once 07/13/23 0904 07/13/23 1016   07/13/23 0915  azithromycin  (ZITHROMAX ) 500 mg in sodium chloride  0.9 % 250 mL IVPB        500 mg 250 mL/hr over 60 Minutes Intravenous  Once 07/13/23 0904 07/13/23 1125              Family Communication/Anticipated D/C date and plan/Code Status   DVT prophylaxis: apixaban  (ELIQUIS ) tablet 2.5 mg Start: 07/13/23 2200 apixaban  (  ELIQUIS ) tablet 2.5 mg     Code Status: Limited: Do not attempt resuscitation (DNR) -DNR-LIMITED -Do Not Intubate/DNI   Family Communication: None Disposition Plan: Plan to discharge home   Status is: Inpatient Remains inpatient appropriate because: Acute CHF respiratory failure       Subjective:   Interval events noted.  She complains of chest tightness.  Breathing is a little better.  Teresa Cook, urology PA, was at the bedside when I entered the room.  Dr. Jacobo, oncologist also into the room during this encounter.  Objective:    Vitals:   07/14/23 0600 07/14/23 0630 07/14/23 0818 07/14/23 1130  BP: 131/77 104/70 128/78 (!) 147/89  Pulse: 80 78 73 84  Resp:  18 18 16   Temp:   98.6 F (37 C) 98.2 F (36.8 C)  TempSrc:   Oral Oral  SpO2: 100% 100% 100% 100%  Weight:      Height:       No data found.   Intake/Output Summary (Last 24 hours) at 07/14/2023 1251 Last data filed at 07/14/2023 0818 Gross per 24 hour  Intake 1709.84 ml  Output 2500 ml  Net -790.16 ml   Filed Weights   07/13/23 0639  Weight: 57.2 kg     Exam:  GEN: NAD SKIN: No rash EYES: No pallor or icterus ENT: MMM CV: RRR PULM: Basilar rales.  No wheezing ABD: soft, ND, NT, +BS CNS: AAO x 3, non focal EXT: No edema or tenderness    Data Reviewed:   I have personally reviewed following labs and imaging studies:  Labs: Labs show the following:   Basic Metabolic Panel: Recent Labs  Lab 07/13/23 0645  NA 136  K 3.7  CL 99  CO2 21*  GLUCOSE 103*  BUN 29*  CREATININE 7.62*  CALCIUM  10.6*   GFR Estimated Creatinine Clearance: 6.3 mL/min (A) (by C-G formula based on SCr of 7.62 mg/dL (H)). Liver Function Tests: Recent Labs  Lab 07/13/23 0645  AST 26  ALT 25  ALKPHOS 76  BILITOT 0.9  PROT 7.1  ALBUMIN  3.5   Recent Labs  Lab 07/13/23 0645  LIPASE 25   No results for input(s): AMMONIA in the last 168 hours. Coagulation profile No results for input(s): INR, PROTIME in the last 168 hours.  CBC: Recent Labs  Lab 07/13/23 0645  WBC 11.7*  NEUTROABS 9.1*  HGB 10.3*  HCT 30.4*  MCV 85.2  PLT 148*   Cardiac Enzymes: No results for input(s): CKTOTAL, CKMB, CKMBINDEX, TROPONINI in the last 168 hours. BNP (last 3 results) No results for input(s): PROBNP in the last 8760 hours. CBG: Recent Labs  Lab 07/13/23 1632 07/13/23 2151 07/14/23 0935 07/14/23 1132  GLUCAP 85 110* 83 123*   D-Dimer: No results for input(s): DDIMER in the last 72 hours. Hgb A1c: Recent Labs    07/13/23 1240  HGBA1C 4.7*   Lipid Profile: No results for input(s): CHOL, HDL, LDLCALC, TRIG, CHOLHDL, LDLDIRECT in the last 72 hours. Thyroid  function studies: No results for input(s): TSH, T4TOTAL, T3FREE, THYROIDAB in the last 72 hours.  Invalid input(s): FREET3 Anemia work up: No results for input(s): VITAMINB12, FOLATE, FERRITIN, TIBC, IRON, RETICCTPCT in the last 72 hours. Sepsis Labs: Recent Labs  Lab 07/13/23 0645 07/13/23 0940  WBC 11.7*  --    LATICACIDVEN  --  1.2    Microbiology Recent Results (from the past 240 hours)  Resp panel by RT-PCR (RSV, Flu A&B, Covid) Anterior Nasal Swab  Status: None   Collection Time: 07/13/23  6:45 AM   Specimen: Anterior Nasal Swab  Result Value Ref Range Status   SARS Coronavirus 2 by RT PCR NEGATIVE NEGATIVE Final    Comment: (NOTE) SARS-CoV-2 target nucleic acids are NOT DETECTED.  The SARS-CoV-2 RNA is generally detectable in upper respiratory specimens during the acute phase of infection. The lowest concentration of SARS-CoV-2 viral copies this assay can detect is 138 copies/mL. A negative result does not preclude SARS-Cov-2 infection and should not be used as the sole basis for treatment or other patient management decisions. A negative result may occur with  improper specimen collection/handling, submission of specimen other than nasopharyngeal swab, presence of viral mutation(s) within the areas targeted by this assay, and inadequate number of viral copies(<138 copies/mL). A negative result must be combined with clinical observations, patient history, and epidemiological information. The expected result is Negative.  Fact Sheet for Patients:  BloggerCourse.com  Fact Sheet for Healthcare Providers:  SeriousBroker.it  This test is no t yet approved or cleared by the [place]  FDA and  has been authorized for detection and/or diagnosis of SARS-CoV-2 by FDA under an Emergency Use Authorization (EUA). This EUA will remain  in effect (meaning this test can be used) for the duration of the COVID-19 declaration under Section 564(b)(1) of the Act, 21 U.S.C.section 360bbb-3(b)(1), unless the authorization is terminated  or revoked sooner.       Influenza A by PCR NEGATIVE NEGATIVE Final   Influenza B by PCR NEGATIVE NEGATIVE Final    Comment: (NOTE) The Xpert Xpress SARS-CoV-2/FLU/RSV plus assay is intended as an aid in  the diagnosis of influenza from Nasopharyngeal swab specimens and should not be used as a sole basis for treatment. Nasal washings and aspirates are unacceptable for Xpert Xpress SARS-CoV-2/FLU/RSV testing.  Fact Sheet for Patients: BloggerCourse.com  Fact Sheet for Healthcare Providers: SeriousBroker.it  This test is not yet approved or cleared by the [place]  FDA and has been authorized for detection and/or diagnosis of SARS-CoV-2 by FDA under an Emergency Use Authorization (EUA). This EUA will remain in effect (meaning this test can be used) for the duration of the COVID-19 declaration under Section 564(b)(1) of the Act, 21 U.S.C. section 360bbb-3(b)(1), unless the authorization is terminated or revoked.     Resp Syncytial Virus by PCR NEGATIVE NEGATIVE Final    Comment: (NOTE) Fact Sheet for Patients: BloggerCourse.com  Fact Sheet for Healthcare Providers: SeriousBroker.it  This test is not yet approved or cleared by the [place]  FDA and has been authorized for detection and/or diagnosis of SARS-CoV-2 by FDA under an Emergency Use Authorization (EUA). This EUA will remain in effect (meaning this test can be used) for the duration of the COVID-19 declaration under Section 564(b)(1) of the Act, 21 U.S.C. section 360bbb-3(b)(1), unless the authorization is terminated or revoked.  Performed at [facility], [address] [place], [place] [address]   Blood culture (routine x 2)     Status: None (Preliminary result)   Collection Time: 07/13/23  9:30 AM   Specimen: BLOOD  Result Value Ref Range Status   Specimen Description BLOOD BLOOD LEFT FOREARM  Final   Special Requests   Final    BOTTLES DRAWN AEROBIC AND ANAEROBIC Blood Culture results may not be optimal due to an inadequate volume of blood received in culture bottles   Culture   Final    NO GROWTH  < 24 HOURS Performed at [facility], [address] [place], [place] [address]    Report Status PENDING  Incomplete  Blood culture (routine x 2)     Status: None (Preliminary result)   Collection Time: 07/13/23  9:40 AM   Specimen: BLOOD  Result Value Ref Range Status   Specimen Description BLOOD BLOOD RIGHT HAND  Final   Special Requests   Final    BOTTLES DRAWN AEROBIC AND ANAEROBIC Blood Culture results may not be optimal due to an inadequate volume of blood received in culture bottles   Culture   Final    NO GROWTH < 24 HOURS Performed at [facility], [address] [place], [place] [address]    Report Status PENDING  Incomplete  MRSA Next Gen by PCR, Nasal     Status: None   Collection Time: 07/14/23  5:30 AM   Specimen: Nasal Mucosa; Nasal Swab  Result Value Ref Range Status   MRSA by PCR Next Gen NOT DETECTED NOT DETECTED Final    Comment: (NOTE) The GeneXpert MRSA Assay (FDA approved for NASAL specimens only), is one component of a comprehensive MRSA colonization surveillance program. It is not intended to diagnose MRSA infection nor to guide or monitor treatment for MRSA infections. Test performance is not FDA approved in patients less than 52 years old. Performed at [facility], [address] [place], [place] [address]     Procedures and diagnostic studies:  DG ESOPHAGUS W SINGLE CM (SOL OR THIN BA) Result Date: 07/14/2023 CLINICAL DATA:  [id number] Dysphagia 5248 68 year old female. Endorsing worsening dysphagia with solids. Request is for esophagram for further evaluation EXAM: ESOPHAGUS/BARIUM SWALLOW/TABLET STUDY TECHNIQUE: Single contrast examination was performed using thin liquid barium. This exam was performed by Delon Beagle NP, and was supervised and interpreted by Dr. Thom Hall. FLUOROSCOPY: Radiation Exposure Index and estimated peak skin dose (PSD); Reference air kerma (RAK), 14.2 mGy. Kerma-area product (KAP), 292.1  uGy*m. COMPARISON:  Chest XR, 07/13/2023.  CTA CAP, 11/05/2021. FINDINGS: Swallowing: Appears normal. No vestibular penetration or aspiration seen. Pharynx: Unremarkable. Esophagus: Normal appearance. Esophageal motility: Mild esophageal dysmotility, with tertiary contractions. Otherwise, within normal limits. Hiatal Hernia: None. Gastroesophageal reflux: None visualized. Ingested 13mm barium tablet: Passed normally Other: None. IMPRESSION: No masses, lesions or significant dysmotility. Electronically Signed   By: Thom Hall M.D.   On: 07/14/2023 10:55   CT Angio Chest PE W/Cm &/Or Wo Cm Result Date: 07/13/2023 CLINICAL DATA:  Chest and abdominal pain for 2-4 weeks, hypoxia, dialysis * Tracking Code: BO * EXAM: CT ANGIOGRAPHY CHEST CT ABDOMEN AND PELVIS WITH CONTRAST TECHNIQUE: Multidetector CT imaging of the chest was performed using the standard protocol during bolus administration of intravenous contrast. Multiplanar CT image reconstructions and MIPs were obtained to evaluate the vascular anatomy. Multidetector CT imaging of the abdomen and pelvis was performed using the standard protocol during bolus administration of intravenous contrast. RADIATION DOSE REDUCTION: This exam was performed according to the departmental dose-optimization program which includes automated exposure control, adjustment of the mA and/or kV according to patient size and/or use of iterative reconstruction technique. CONTRAST:  OMNIPAQUE  IOHEXOL  350 MG/ML SOLN COMPARISON:  12/05/2021 FINDINGS: CT CHEST ANGIOGRAM FINDINGS Cardiovascular: Satisfactory opacification of the pulmonary arteries to the segmental level. No evidence of pulmonary embolism. Cardiomegaly. Left and right coronary artery calcifications. No pericardial effusion. Enlargement of the tubular ascending thoracic aorta measuring up to 4.6 x 4.5 cm. Severe aortic atherosclerosis. Mediastinum/Nodes: No enlarged mediastinal, hilar, or axillary lymph nodes. Thyroid   gland, trachea, and  esophagus demonstrate no significant findings. Lungs/Pleura: Moderate bilateral pleural effusions and associated atelectasis or consolidation. Extensive heterogeneous and ground-glass airspace disease throughout the right upper lobe (series 7, image 32). Diffuse interlobular septal thickening throughout both lungs. Musculoskeletal: No chest wall abnormality. No acute osseous findings. Review of the MIP images confirms the above findings. CT ABDOMEN PELVIS FINDINGS Hepatobiliary: No solid liver abnormality is seen. No gallstones, gallbladder wall thickening, or biliary dilatation. Pancreas: Unremarkable. No pancreatic ductal dilatation or surrounding inflammatory changes. Spleen: Normal in size without significant abnormality. Adrenals/Urinary Tract: Adrenal glands are unremarkable. Severely atrophic cystic kidneys, consistent with chronic renal disease and dialysis. Enhancing mass arising from the inferior pole of the right kidney measuring 2.5 x 2.4 cm (series 12, image 23). Appearance of the posterior midportion of the left kidney is suspicious for a second more irregular mass measuring 2.4 x 1.7 cm (series 12, image 22). Bladder is unremarkable. Stomach/Bowel: Stomach is within normal limits. Appendix not clearly visualized. No evidence of bowel wall thickening, distention, or inflammatory changes. Vascular/Lymphatic: Severe aortic atherosclerosis. Large-bore right femoral approach dialysis catheter. No enlarged abdominal or pelvic lymph nodes. Reproductive: Hysterectomy. Other: No abdominal wall hernia or abnormality. Trace free fluid in the low pelvis. Musculoskeletal: No acute or significant osseous findings. IMPRESSION: 1. Negative examination for pulmonary embolism. 2. Moderate bilateral pleural effusions and associated atelectasis or consolidation. Diffuse interlobular septal thickening throughout both lungs, findings consistent with pulmonary edema. 3. Extensive heterogeneous and  ground-glass airspace disease throughout the right upper lobe, consistent with superimposed infection or aspiration. 4. Cardiomegaly and coronary artery disease. 5. Enlargement of the tubular ascending thoracic aorta measuring up to 4.6 x 4.5 cm. Ascending thoracic aortic aneurysm. Recommend semi-annual imaging followup by CTA or MRA and referral to cardiothoracic surgery if not already obtained and if clinically appropriate. This recommendation follows 2010 ACCF/AHA/AATS/ACR/ASA/SCA/SCAI/SIR/STS/SVM Guidelines for the Diagnosis and Management of Patients With Thoracic Aortic Disease. Circulation. 2010; 121: Z733-z630. Aortic aneurysm NOS (ICD10-I71.9) 6. Severely atrophic cystic kidneys, consistent with chronic renal disease and dialysis. 7. Enhancing mass arising from the inferior pole of the right kidney measuring 2.5 x 2.4 cm, consistent with renal cell carcinoma. Appearance of the posterior midportion of the left kidney is suspicious for a second more irregular mass measuring 2.4 x 1.7 cm. Recommend multiphasic contrast enhanced CT or MRI on a nonemergent, outpatient basis for more confident assessment. Aortic Atherosclerosis (ICD10-I70.0). Electronically Signed   By: Marolyn JONETTA Jaksch M.D.   On: 07/13/2023 08:47   CT ABDOMEN PELVIS W CONTRAST Result Date: 07/13/2023 CLINICAL DATA:  Chest and abdominal pain for 2-4 weeks, hypoxia, dialysis * Tracking Code: BO * EXAM: CT ANGIOGRAPHY CHEST CT ABDOMEN AND PELVIS WITH CONTRAST TECHNIQUE: Multidetector CT imaging of the chest was performed using the standard protocol during bolus administration of intravenous contrast. Multiplanar CT image reconstructions and MIPs were obtained to evaluate the vascular anatomy. Multidetector CT imaging of the abdomen and pelvis was performed using the standard protocol during bolus administration of intravenous contrast. RADIATION DOSE REDUCTION: This exam was performed according to the departmental dose-optimization program which  includes automated exposure control, adjustment of the mA and/or kV according to patient size and/or use of iterative reconstruction technique. CONTRAST:  OMNIPAQUE  IOHEXOL  350 MG/ML SOLN COMPARISON:  12/05/2021 FINDINGS: CT CHEST ANGIOGRAM FINDINGS Cardiovascular: Satisfactory opacification of the pulmonary arteries to the segmental level. No evidence of pulmonary embolism. Cardiomegaly. Left and right coronary artery calcifications. No pericardial effusion. Enlargement of the tubular ascending thoracic aorta measuring up  to 4.6 x 4.5 cm. Severe aortic atherosclerosis. Mediastinum/Nodes: No enlarged mediastinal, hilar, or axillary lymph nodes. Thyroid  gland, trachea, and esophagus demonstrate no significant findings. Lungs/Pleura: Moderate bilateral pleural effusions and associated atelectasis or consolidation. Extensive heterogeneous and ground-glass airspace disease throughout the right upper lobe (series 7, image 32). Diffuse interlobular septal thickening throughout both lungs. Musculoskeletal: No chest wall abnormality. No acute osseous findings. Review of the MIP images confirms the above findings. CT ABDOMEN PELVIS FINDINGS Hepatobiliary: No solid liver abnormality is seen. No gallstones, gallbladder wall thickening, or biliary dilatation. Pancreas: Unremarkable. No pancreatic ductal dilatation or surrounding inflammatory changes. Spleen: Normal in size without significant abnormality. Adrenals/Urinary Tract: Adrenal glands are unremarkable. Severely atrophic cystic kidneys, consistent with chronic renal disease and dialysis. Enhancing mass arising from the inferior pole of the right kidney measuring 2.5 x 2.4 cm (series 12, image 23). Appearance of the posterior midportion of the left kidney is suspicious for a second more irregular mass measuring 2.4 x 1.7 cm (series 12, image 22). Bladder is unremarkable. Stomach/Bowel: Stomach is within normal limits. Appendix not clearly visualized. No evidence of  bowel wall thickening, distention, or inflammatory changes. Vascular/Lymphatic: Severe aortic atherosclerosis. Large-bore right femoral approach dialysis catheter. No enlarged abdominal or pelvic lymph nodes. Reproductive: Hysterectomy. Other: No abdominal wall hernia or abnormality. Trace free fluid in the low pelvis. Musculoskeletal: No acute or significant osseous findings. IMPRESSION: 1. Negative examination for pulmonary embolism. 2. Moderate bilateral pleural effusions and associated atelectasis or consolidation. Diffuse interlobular septal thickening throughout both lungs, findings consistent with pulmonary edema. 3. Extensive heterogeneous and ground-glass airspace disease throughout the right upper lobe, consistent with superimposed infection or aspiration. 4. Cardiomegaly and coronary artery disease. 5. Enlargement of the tubular ascending thoracic aorta measuring up to 4.6 x 4.5 cm. Ascending thoracic aortic aneurysm. Recommend semi-annual imaging followup by CTA or MRA and referral to cardiothoracic surgery if not already obtained and if clinically appropriate. This recommendation follows 2010 ACCF/AHA/AATS/ACR/ASA/SCA/SCAI/SIR/STS/SVM Guidelines for the Diagnosis and Management of Patients With Thoracic Aortic Disease. Circulation. 2010; 121: Z733-z630. Aortic aneurysm NOS (ICD10-I71.9) 6. Severely atrophic cystic kidneys, consistent with chronic renal disease and dialysis. 7. Enhancing mass arising from the inferior pole of the right kidney measuring 2.5 x 2.4 cm, consistent with renal cell carcinoma. Appearance of the posterior midportion of the left kidney is suspicious for a second more irregular mass measuring 2.4 x 1.7 cm. Recommend multiphasic contrast enhanced CT or MRI on a nonemergent, outpatient basis for more confident assessment. Aortic Atherosclerosis (ICD10-I70.0). Electronically Signed   By: Marolyn JONETTA Jaksch M.D.   On: 07/13/2023 08:47   DG Chest Portable 1 View Result Date:  07/13/2023 CLINICAL DATA:  Hypoxia.  Abdominal pain and chest pain. EXAM: PORTABLE CHEST 1 VIEW COMPARISON:  11/30/2021 FINDINGS: Stable cardiomediastinal contours. Aortic atherosclerosis. Small bilateral pleural effusions. Diffuse increase interstitial markings. No consolidative change. No signs of pneumothorax. No acute osseous findings. IMPRESSION: Small bilateral pleural effusions and diffuse increase interstitial markings compatible with pulmonary edema. Aortic Atherosclerosis (ICD10-I70.0). Electronically Signed   By: Waddell Calk M.D.   On: 07/13/2023 07:04               LOS: 1 day   Nawaal Alling  [facility]   Pager on [email or web address]. If 7PM-7AM, please contact night-coverage at [email or web address]     07/14/2023, 12:51 PM

## 2023-07-14 NOTE — Progress Notes (Signed)
 Heart Failure Navigator Progress Note  Assessed for Heart & Vascular TOC clinic readiness.  Patient does not meet criteria due to ESRD on Hemodialysis.   Navigator will sign off at this time.  Roxy Horseman, RN, BSN [facility] Heart Failure Navigator Secure Chat Only

## 2023-07-14 NOTE — Plan of Care (Signed)
  Problem: Clinical Measurements: Goal: Ability to maintain clinical measurements within normal limits will improve Outcome: Progressing   Problem: Pain Managment: Goal: General experience of comfort will improve and/or be controlled Outcome: Progressing   Problem: Safety: Goal: Ability to remain free from injury will improve Outcome: Progressing

## 2023-07-14 NOTE — Progress Notes (Signed)
 [facility]  ROUNDING NOTE   Subjective:   [name] was admitted to [facility] on 07/13/2023 for Renal mass [N28.89] Acute respiratory failure with hypoxia (HCC) [J96.01] Pneumonia of right lung due to infectious organism, unspecified part of lung [J18.9] Acute hypoxic respiratory failure (HCC) [J96.01]  Last hemodialysis treatment was Friday. Patient received 2:45 minutes of prescribed dialysis treatment. Patient is 2 kg above her dry weight.   Patient seen laying in bed Currently on 4L Patient states she has had a poor appetite and feels she has lost weight Concerned that her dry weight may need to be decreased  Objective:  Vital signs in last 24 hours:  Temp:  [98.2 F (36.8 C)-99 F (37.2 C)] 99 F (37.2 C) (07/07 1315) Pulse Rate:  [62-89] 72 (07/07 1500) Resp:  [11-26] 19 (07/07 1500) BP: (96-168)/(56-99) 116/79 (07/07 1500) SpO2:  [99 %-100 %] 100 % (07/07 1500) Weight:  [54.2 kg] 54.2 kg (07/07 1334)  Weight change:  Filed Weights   07/13/23 0639 07/14/23 1334  Weight: 57.2 kg 54.2 kg    Intake/Output: I/O last 3 completed shifts: In: 1295.4 [I.V.:1000; IV Piggyback:295.4] Out: 2500 [Other:2500]   Intake/Output this shift:  Total I/O In: 468.2 [P.O.:240; IV Piggyback:228.2] Out: -   Physical Exam: General: Ill appearing  Head: Normocephalic, atraumatic. Moist oral mucosal membranes  Eyes: Anicteric  Lungs:  wheezes on left. 4L [place] O2.  Heart: Regular rate and rhythm  Abdomen:  Soft, nontender  Extremities:  no peripheral edema.  Neurologic: Alert, awake  Skin: No lesions  Access: Right femoral permcath    Basic Metabolic Panel: Recent Labs  Lab 07/13/23 0645  NA 136  K 3.7  CL 99  CO2 21*  GLUCOSE 103*  BUN 29*  CREATININE 7.62*  CALCIUM  10.6*    Liver Function Tests: Recent Labs  Lab 07/13/23 0645  AST 26  ALT 25  ALKPHOS 76  BILITOT 0.9  PROT 7.1  ALBUMIN  3.5   Recent Labs  Lab 07/13/23 0645  LIPASE 25    No results for input(s): AMMONIA in the last 168 hours.  CBC: Recent Labs  Lab 07/13/23 0645  WBC 11.7*  NEUTROABS 9.1*  HGB 10.3*  HCT 30.4*  MCV 85.2  PLT 148*    Cardiac Enzymes: No results for input(s): CKTOTAL, CKMB, CKMBINDEX, TROPONINI in the last 168 hours.  BNP: Invalid input(s): POCBNP  CBG: Recent Labs  Lab 07/13/23 1632 07/13/23 2151 07/14/23 0935 07/14/23 1132  GLUCAP 85 110* 83 123*    Microbiology: Results for orders placed or performed during the hospital encounter of 07/13/23  Resp panel by RT-PCR (RSV, Flu A&B, Covid) Anterior Nasal Swab     Status: None   Collection Time: 07/13/23  6:45 AM   Specimen: Anterior Nasal Swab  Result Value Ref Range Status   SARS Coronavirus 2 by RT PCR NEGATIVE NEGATIVE Final    Comment: (NOTE) SARS-CoV-2 target nucleic acids are NOT DETECTED.  The SARS-CoV-2 RNA is generally detectable in upper respiratory specimens during the acute phase of infection. The lowest concentration of SARS-CoV-2 viral copies this assay can detect is 138 copies/mL. A negative result does not preclude SARS-Cov-2 infection and should not be used as the sole basis for treatment or other patient management decisions. A negative result may occur with  improper specimen collection/handling, submission of specimen other than nasopharyngeal swab, presence of viral mutation(s) within the areas targeted by this assay, and inadequate number of viral copies(<138 copies/mL). A  negative result must be combined with clinical observations, patient history, and epidemiological information. The expected result is Negative.  Fact Sheet for Patients:  BloggerCourse.com  Fact Sheet for Healthcare Providers:  SeriousBroker.it  This test is no t yet approved or cleared by the [place]  FDA and  has been authorized for detection and/or diagnosis of SARS-CoV-2 by FDA under an Emergency  Use Authorization (EUA). This EUA will remain  in effect (meaning this test can be used) for the duration of the COVID-19 declaration under Section 564(b)(1) of the Act, 21 U.S.C.section 360bbb-3(b)(1), unless the authorization is terminated  or revoked sooner.       Influenza A by PCR NEGATIVE NEGATIVE Final   Influenza B by PCR NEGATIVE NEGATIVE Final    Comment: (NOTE) The Xpert Xpress SARS-CoV-2/FLU/RSV plus assay is intended as an aid in the diagnosis of influenza from Nasopharyngeal swab specimens and should not be used as a sole basis for treatment. Nasal washings and aspirates are unacceptable for Xpert Xpress SARS-CoV-2/FLU/RSV testing.  Fact Sheet for Patients: BloggerCourse.com  Fact Sheet for Healthcare Providers: SeriousBroker.it  This test is not yet approved or cleared by the [place]  FDA and has been authorized for detection and/or diagnosis of SARS-CoV-2 by FDA under an Emergency Use Authorization (EUA). This EUA will remain in effect (meaning this test can be used) for the duration of the COVID-19 declaration under Section 564(b)(1) of the Act, 21 U.S.C. section 360bbb-3(b)(1), unless the authorization is terminated or revoked.     Resp Syncytial Virus by PCR NEGATIVE NEGATIVE Final    Comment: (NOTE) Fact Sheet for Patients: BloggerCourse.com  Fact Sheet for Healthcare Providers: SeriousBroker.it  This test is not yet approved or cleared by the [place]  FDA and has been authorized for detection and/or diagnosis of SARS-CoV-2 by FDA under an Emergency Use Authorization (EUA). This EUA will remain in effect (meaning this test can be used) for the duration of the COVID-19 declaration under Section 564(b)(1) of the Act, 21 U.S.C. section 360bbb-3(b)(1), unless the authorization is terminated or revoked.  Performed at [facility], [address] [place], [place] [address]   Blood culture (routine x 2)     Status: None (Preliminary result)   Collection Time: 07/13/23  9:30 AM   Specimen: BLOOD  Result Value Ref Range Status   Specimen Description BLOOD BLOOD LEFT FOREARM  Final   Special Requests   Final    BOTTLES DRAWN AEROBIC AND ANAEROBIC Blood Culture results may not be optimal due to an inadequate volume of blood received in culture bottles   Culture   Final    NO GROWTH < 24 HOURS Performed at [facility], [address], [place], [place] [address]    Report Status PENDING  Incomplete  Blood culture (routine x 2)     Status: None (Preliminary result)   Collection Time: 07/13/23  9:40 AM   Specimen: BLOOD  Result Value Ref Range Status   Specimen Description BLOOD BLOOD RIGHT HAND  Final   Special Requests   Final    BOTTLES DRAWN AEROBIC AND ANAEROBIC Blood Culture results may not be optimal due to an inadequate volume of blood received in culture bottles   Culture   Final    NO GROWTH < 24 HOURS Performed at [facility], [address] [place], [place] [address]    Report Status PENDING  Incomplete  MRSA Next Gen by PCR, Nasal     Status: None  Collection Time: 07/14/23  5:30 AM   Specimen: Nasal Mucosa; Nasal Swab  Result Value Ref Range Status   MRSA by PCR Next Gen NOT DETECTED NOT DETECTED Final    Comment: (NOTE) The GeneXpert MRSA Assay (FDA approved for NASAL specimens only), is one component of a comprehensive MRSA colonization surveillance program. It is not intended to diagnose MRSA infection nor to guide or monitor treatment for MRSA infections. Test performance is not FDA approved in patients less than 72 years old. Performed at [facility], [address] [place], [place] [address]     Coagulation Studies: No results for input(s): LABPROT, INR in the last 72 hours.  Urinalysis: No results for input(s): COLORURINE, LABSPEC, PHURINE,  GLUCOSEU, HGBUR, BILIRUBINUR, KETONESUR, PROTEINUR, UROBILINOGEN, NITRITE, LEUKOCYTESUR in the last 72 hours.  Invalid input(s): APPERANCEUR    Imaging: DG ESOPHAGUS W SINGLE CM (SOL OR THIN BA) Result Date: 07/14/2023 CLINICAL DATA:  [id number] Dysphagia 7825 68 year old female. Endorsing worsening dysphagia with solids. Request is for esophagram for further evaluation EXAM: ESOPHAGUS/BARIUM SWALLOW/TABLET STUDY TECHNIQUE: Single contrast examination was performed using thin liquid barium. This exam was performed by Delon Beagle NP, and was supervised and interpreted by Dr. Thom Hall. FLUOROSCOPY: Radiation Exposure Index and estimated peak skin dose (PSD); Reference air kerma (RAK), 14.2 mGy. Kerma-area product (KAP), 292.1 uGy*m. COMPARISON:  Chest XR, 07/13/2023.  CTA CAP, 11/05/2021. FINDINGS: Swallowing: Appears normal. No vestibular penetration or aspiration seen. Pharynx: Unremarkable. Esophagus: Normal appearance. Esophageal motility: Mild esophageal dysmotility, with tertiary contractions. Otherwise, within normal limits. Hiatal Hernia: None. Gastroesophageal reflux: None visualized. Ingested 13mm barium tablet: Passed normally Other: None. IMPRESSION: No masses, lesions or significant dysmotility. Electronically Signed   By: Thom Hall M.D.   On: 07/14/2023 10:55   CT Angio Chest PE W/Cm &/Or Wo Cm Result Date: 07/13/2023 CLINICAL DATA:  Chest and abdominal pain for 2-4 weeks, hypoxia, dialysis * Tracking Code: BO * EXAM: CT ANGIOGRAPHY CHEST CT ABDOMEN AND PELVIS WITH CONTRAST TECHNIQUE: Multidetector CT imaging of the chest was performed using the standard protocol during bolus administration of intravenous contrast. Multiplanar CT image reconstructions and MIPs were obtained to evaluate the vascular anatomy. Multidetector CT imaging of the abdomen and pelvis was performed using the standard protocol during bolus administration of intravenous contrast. RADIATION DOSE  REDUCTION: This exam was performed according to the departmental dose-optimization program which includes automated exposure control, adjustment of the mA and/or kV according to patient size and/or use of iterative reconstruction technique. CONTRAST:  OMNIPAQUE  IOHEXOL  350 MG/ML SOLN COMPARISON:  12/05/2021 FINDINGS: CT CHEST ANGIOGRAM FINDINGS Cardiovascular: Satisfactory opacification of the pulmonary arteries to the segmental level. No evidence of pulmonary embolism. Cardiomegaly. Left and right coronary artery calcifications. No pericardial effusion. Enlargement of the tubular ascending thoracic aorta measuring up to 4.6 x 4.5 cm. Severe aortic atherosclerosis. Mediastinum/Nodes: No enlarged mediastinal, hilar, or axillary lymph nodes. Thyroid  gland, trachea, and esophagus demonstrate no significant findings. Lungs/Pleura: Moderate bilateral pleural effusions and associated atelectasis or consolidation. Extensive heterogeneous and ground-glass airspace disease throughout the right upper lobe (series 7, image 32). Diffuse interlobular septal thickening throughout both lungs. Musculoskeletal: No chest wall abnormality. No acute osseous findings. Review of the MIP images confirms the above findings. CT ABDOMEN PELVIS FINDINGS Hepatobiliary: No solid liver abnormality is seen. No gallstones, gallbladder wall thickening, or biliary dilatation. Pancreas: Unremarkable. No pancreatic ductal dilatation or surrounding inflammatory changes. Spleen: Normal in size without significant abnormality. Adrenals/Urinary Tract: Adrenal glands are unremarkable. Severely atrophic  cystic kidneys, consistent with chronic renal disease and dialysis. Enhancing mass arising from the inferior pole of the right kidney measuring 2.5 x 2.4 cm (series 12, image 23). Appearance of the posterior midportion of the left kidney is suspicious for a second more irregular mass measuring 2.4 x 1.7 cm (series 12, image 22). Bladder is  unremarkable. Stomach/Bowel: Stomach is within normal limits. Appendix not clearly visualized. No evidence of bowel wall thickening, distention, or inflammatory changes. Vascular/Lymphatic: Severe aortic atherosclerosis. Large-bore right femoral approach dialysis catheter. No enlarged abdominal or pelvic lymph nodes. Reproductive: Hysterectomy. Other: No abdominal wall hernia or abnormality. Trace free fluid in the low pelvis. Musculoskeletal: No acute or significant osseous findings. IMPRESSION: 1. Negative examination for pulmonary embolism. 2. Moderate bilateral pleural effusions and associated atelectasis or consolidation. Diffuse interlobular septal thickening throughout both lungs, findings consistent with pulmonary edema. 3. Extensive heterogeneous and ground-glass airspace disease throughout the right upper lobe, consistent with superimposed infection or aspiration. 4. Cardiomegaly and coronary artery disease. 5. Enlargement of the tubular ascending thoracic aorta measuring up to 4.6 x 4.5 cm. Ascending thoracic aortic aneurysm. Recommend semi-annual imaging followup by CTA or MRA and referral to cardiothoracic surgery if not already obtained and if clinically appropriate. This recommendation follows 2010 ACCF/AHA/AATS/ACR/ASA/SCA/SCAI/SIR/STS/SVM Guidelines for the Diagnosis and Management of Patients With Thoracic Aortic Disease. Circulation. 2010; 121: Z733-z630. Aortic aneurysm NOS (ICD10-I71.9) 6. Severely atrophic cystic kidneys, consistent with chronic renal disease and dialysis. 7. Enhancing mass arising from the inferior pole of the right kidney measuring 2.5 x 2.4 cm, consistent with renal cell carcinoma. Appearance of the posterior midportion of the left kidney is suspicious for a second more irregular mass measuring 2.4 x 1.7 cm. Recommend multiphasic contrast enhanced CT or MRI on a nonemergent, outpatient basis for more confident assessment. Aortic Atherosclerosis (ICD10-I70.0). Electronically  Signed   By: Marolyn JONETTA Jaksch M.D.   On: 07/13/2023 08:47   CT ABDOMEN PELVIS W CONTRAST Result Date: 07/13/2023 CLINICAL DATA:  Chest and abdominal pain for 2-4 weeks, hypoxia, dialysis * Tracking Code: BO * EXAM: CT ANGIOGRAPHY CHEST CT ABDOMEN AND PELVIS WITH CONTRAST TECHNIQUE: Multidetector CT imaging of the chest was performed using the standard protocol during bolus administration of intravenous contrast. Multiplanar CT image reconstructions and MIPs were obtained to evaluate the vascular anatomy. Multidetector CT imaging of the abdomen and pelvis was performed using the standard protocol during bolus administration of intravenous contrast. RADIATION DOSE REDUCTION: This exam was performed according to the departmental dose-optimization program which includes automated exposure control, adjustment of the mA and/or kV according to patient size and/or use of iterative reconstruction technique. CONTRAST:  OMNIPAQUE  IOHEXOL  350 MG/ML SOLN COMPARISON:  12/05/2021 FINDINGS: CT CHEST ANGIOGRAM FINDINGS Cardiovascular: Satisfactory opacification of the pulmonary arteries to the segmental level. No evidence of pulmonary embolism. Cardiomegaly. Left and right coronary artery calcifications. No pericardial effusion. Enlargement of the tubular ascending thoracic aorta measuring up to 4.6 x 4.5 cm. Severe aortic atherosclerosis. Mediastinum/Nodes: No enlarged mediastinal, hilar, or axillary lymph nodes. Thyroid  gland, trachea, and esophagus demonstrate no significant findings. Lungs/Pleura: Moderate bilateral pleural effusions and associated atelectasis or consolidation. Extensive heterogeneous and ground-glass airspace disease throughout the right upper lobe (series 7, image 32). Diffuse interlobular septal thickening throughout both lungs. Musculoskeletal: No chest wall abnormality. No acute osseous findings. Review of the MIP images confirms the above findings. CT ABDOMEN PELVIS FINDINGS Hepatobiliary: No solid  liver abnormality is seen. No gallstones, gallbladder wall thickening, or biliary dilatation. Pancreas: Unremarkable. No  pancreatic ductal dilatation or surrounding inflammatory changes. Spleen: Normal in size without significant abnormality. Adrenals/Urinary Tract: Adrenal glands are unremarkable. Severely atrophic cystic kidneys, consistent with chronic renal disease and dialysis. Enhancing mass arising from the inferior pole of the right kidney measuring 2.5 x 2.4 cm (series 12, image 23). Appearance of the posterior midportion of the left kidney is suspicious for a second more irregular mass measuring 2.4 x 1.7 cm (series 12, image 22). Bladder is unremarkable. Stomach/Bowel: Stomach is within normal limits. Appendix not clearly visualized. No evidence of bowel wall thickening, distention, or inflammatory changes. Vascular/Lymphatic: Severe aortic atherosclerosis. Large-bore right femoral approach dialysis catheter. No enlarged abdominal or pelvic lymph nodes. Reproductive: Hysterectomy. Other: No abdominal wall hernia or abnormality. Trace free fluid in the low pelvis. Musculoskeletal: No acute or significant osseous findings. IMPRESSION: 1. Negative examination for pulmonary embolism. 2. Moderate bilateral pleural effusions and associated atelectasis or consolidation. Diffuse interlobular septal thickening throughout both lungs, findings consistent with pulmonary edema. 3. Extensive heterogeneous and ground-glass airspace disease throughout the right upper lobe, consistent with superimposed infection or aspiration. 4. Cardiomegaly and coronary artery disease. 5. Enlargement of the tubular ascending thoracic aorta measuring up to 4.6 x 4.5 cm. Ascending thoracic aortic aneurysm. Recommend semi-annual imaging followup by CTA or MRA and referral to cardiothoracic surgery if not already obtained and if clinically appropriate. This recommendation follows 2010 ACCF/AHA/AATS/ACR/ASA/SCA/SCAI/SIR/STS/SVM Guidelines  for the Diagnosis and Management of Patients With Thoracic Aortic Disease. Circulation. 2010; 121: Z733-z630. Aortic aneurysm NOS (ICD10-I71.9) 6. Severely atrophic cystic kidneys, consistent with chronic renal disease and dialysis. 7. Enhancing mass arising from the inferior pole of the right kidney measuring 2.5 x 2.4 cm, consistent with renal cell carcinoma. Appearance of the posterior midportion of the left kidney is suspicious for a second more irregular mass measuring 2.4 x 1.7 cm. Recommend multiphasic contrast enhanced CT or MRI on a nonemergent, outpatient basis for more confident assessment. Aortic Atherosclerosis (ICD10-I70.0). Electronically Signed   By: Marolyn JONETTA Jaksch M.D.   On: 07/13/2023 08:47   DG Chest Portable 1 View Result Date: 07/13/2023 CLINICAL DATA:  Hypoxia.  Abdominal pain and chest pain. EXAM: PORTABLE CHEST 1 VIEW COMPARISON:  11/30/2021 FINDINGS: Stable cardiomediastinal contours. Aortic atherosclerosis. Small bilateral pleural effusions. Diffuse increase interstitial markings. No consolidative change. No signs of pneumothorax. No acute osseous findings. IMPRESSION: Small bilateral pleural effusions and diffuse increase interstitial markings compatible with pulmonary edema. Aortic Atherosclerosis (ICD10-I70.0). Electronically Signed   By: Waddell Calk M.D.   On: 07/13/2023 07:04     Medications:    azithromycin  Stopped (07/14/23 0707)   ceFEPime  (MAXIPIME ) IV 1 g (07/14/23 1131)    amiodarone   200 mg Oral BID   apixaban   2.5 mg Oral BID   carvedilol   12.5 mg Oral BID   Chlorhexidine  Gluconate Cloth  6 each Topical Q0600   insulin  aspart  0-6 Units Subcutaneous TID WC   multivitamin  1 tablet Oral Daily   pantoprazole   40 mg Oral Daily   sacubitril -valsartan   1 tablet Oral BID   sertraline   150 mg Oral Daily   traZODone   50 mg Oral QHS   zolpidem   5 mg Oral QHS   acetaminophen   Assessment/ Plan:  [name] is a 68 y.o.  female with end stage renal  disease on hemodialysis, hypertension, hyperlipidemia, congestive heart failure, atrial fibrillation, gout, and DVT who is admitted to [facility] on 07/13/2023 for Renal mass [N28.89] Acute respiratory failure with hypoxia (HCC) [J96.01] Pneumonia of  right lung due to infectious organism, unspecified part of lung [J18.9] Acute hypoxic respiratory failure (HCC) [J96.01]  [facility] Right femoral catheter 55kg.   End Stage Renal Disease: 2kg above target weight and acute pulmonary failure.  - emergent hemodialysis received yesterday, UF 2.5L - Will receive dialysis today, UF 1.5L and monitor post weight  - Next treatment scheduled for Wednesday  Acute Respiratory failure: requiring [place] O2. With pulmonary edema and right upper lobe pneumonia.  - empiric antibiotics - supportive care with O2.  - Improved after dialysis  Hypertension with chronic kidney disease: - recommend restarting home medications after dialysis.  - home regimen of isosorbide  mononitrate, Entresto , and carvedilol   Secondary Hyperparathyroidism with hypercalcemia.  - receives etelcalcetide as outpatient.  - sevelamer  with meals.   Renal mass: right. Consistent with RCC. Incidental finding.  - Consult urology for outpatient follow up.  - hold esa.     LOS: 1 Ranferi Clingan 7/7/20253:47 PM

## 2023-07-15 ENCOUNTER — Inpatient Hospital Stay (HOSPITAL_COMMUNITY)
Admit: 2023-07-15 | Discharge: 2023-07-15 | Disposition: A | Discharge status: Home / Self Care | Attending: Internal Medicine | Admitting: Internal Medicine

## 2023-07-15 DIAGNOSIS — R0609 Other forms of dyspnea: Secondary | ICD-10-CM | POA: Diagnosis not present

## 2023-07-15 DIAGNOSIS — I480 Paroxysmal atrial fibrillation: Secondary | ICD-10-CM | POA: Diagnosis not present

## 2023-07-15 DIAGNOSIS — I5043 Acute on chronic combined systolic (congestive) and diastolic (congestive) heart failure: Secondary | ICD-10-CM

## 2023-07-15 DIAGNOSIS — R1110 Vomiting, unspecified: Secondary | ICD-10-CM | POA: Diagnosis not present

## 2023-07-15 DIAGNOSIS — I5021 Acute systolic (congestive) heart failure: Secondary | ICD-10-CM | POA: Diagnosis not present

## 2023-07-15 DIAGNOSIS — J9601 Acute respiratory failure with hypoxia: Secondary | ICD-10-CM | POA: Diagnosis not present

## 2023-07-15 DIAGNOSIS — R1310 Dysphagia, unspecified: Secondary | ICD-10-CM | POA: Diagnosis not present

## 2023-07-15 LAB — CBC
HCT: 22.3 % — ABNORMAL LOW (ref 36.0–46.0)
Hemoglobin: 7.4 g/dL — ABNORMAL LOW (ref 12.0–15.0)
MCH: 29 pg (ref 26.0–34.0)
MCHC: 33.2 g/dL (ref 30.0–36.0)
MCV: 87.5 fL (ref 80.0–100.0)
Platelets: 148 K/uL — ABNORMAL LOW (ref 150–400)
RBC: 2.55 MIL/uL — ABNORMAL LOW (ref 3.87–5.11)
RDW: 15.7 % — ABNORMAL HIGH (ref 11.5–15.5)
WBC: 5.7 K/uL (ref 4.0–10.5)
nRBC: 0 % (ref 0.0–0.2)

## 2023-07-15 LAB — BASIC METABOLIC PANEL WITH GFR
Anion gap: 13 (ref 5–15)
BUN: 23 mg/dL (ref 8–23)
CO2: 26 mmol/L (ref 22–32)
Calcium: 9.9 mg/dL (ref 8.9–10.3)
Chloride: 97 mmol/L — ABNORMAL LOW (ref 98–111)
Creatinine, Ser: 5.37 mg/dL — ABNORMAL HIGH (ref 0.44–1.00)
GFR, Estimated: 8 mL/min — ABNORMAL LOW (ref >60)
Glucose, Bld: 138 mg/dL — ABNORMAL HIGH (ref 70–99)
Potassium: 3.6 mmol/L (ref 3.5–5.1)
Sodium: 136 mmol/L (ref 135–145)

## 2023-07-15 LAB — ECHOCARDIOGRAM COMPLETE
AR max vel: 1.99 cm2
AV Area VTI: 2.04 cm2
AV Area mean vel: 1.93 cm2
AV Mean grad: 4 mmHg
AV Peak grad: 5.9 mmHg
Ao pk vel: 1.21 m/s
Area-P 1/2: 3.85 cm2
Height: 65 in
MV VTI: 1.55 cm2
S' Lateral: 2.42 cm
Weight: 1911.83 [oz_av]

## 2023-07-15 LAB — GLUCOSE, CAPILLARY
Glucose-Capillary: 107 mg/dL — ABNORMAL HIGH (ref 70–99)
Glucose-Capillary: 112 mg/dL — ABNORMAL HIGH (ref 70–99)
Glucose-Capillary: 139 mg/dL — ABNORMAL HIGH (ref 70–99)
Glucose-Capillary: 127 mg/dL — ABNORMAL HIGH (ref 70–99)

## 2023-07-15 LAB — ABO/RH: ABO/RH(D): O POS

## 2023-07-15 LAB — HEMOGLOBIN AND HEMATOCRIT, BLOOD
HCT: 23.5 % — ABNORMAL LOW (ref 36.0–46.0)
Hemoglobin: 7.9 g/dL — ABNORMAL LOW (ref 12.0–15.0)

## 2023-07-15 LAB — HEPATITIS B SURFACE ANTIBODY, QUANTITATIVE: Hep B S AB Quant (Post): 6.4 m[IU]/mL — ABNORMAL LOW

## 2023-07-15 LAB — PREPARE RBC (CROSSMATCH)

## 2023-07-15 MED ORDER — AZITHROMYCIN 250 MG PO TABS
500.0000 mg | ORAL_TABLET | Freq: Every day | ORAL | Status: DC
  Administered 2023-07-16: 500 mg via ORAL
  Filled 2023-07-15: qty 2

## 2023-07-15 MED ORDER — SODIUM CHLORIDE 0.9% IV SOLUTION: Freq: Once | INTRAVENOUS | Status: DC

## 2023-07-15 MED ORDER — ACETAMINOPHEN 325 MG PO TABS
650.0000 mg | ORAL_TABLET | Freq: Four times a day (QID) | ORAL | Status: DC | PRN
  Administered 2023-07-16: 650 mg via ORAL

## 2023-07-15 MED ORDER — HYDROMORPHONE HCL 1 MG/ML IJ SOLN
0.5000 mg | INTRAMUSCULAR | Status: DC | PRN
  Administered 2023-07-15: 0.5 mg via INTRAVENOUS
  Filled 2023-07-15: qty 0.5

## 2023-07-15 MED ORDER — HYDROCODONE-ACETAMINOPHEN 5-325 MG PO TABS
1.0000 | ORAL_TABLET | Freq: Three times a day (TID) | ORAL | Status: AC | PRN
  Administered 2023-07-16 – 2023-07-17 (×2): 1 via ORAL
  Filled 2023-07-15 (×2): qty 1

## 2023-07-15 MED ORDER — AMIODARONE HCL 200 MG PO TABS
200.0000 mg | ORAL_TABLET | Freq: Every day | ORAL | Status: DC
Start: 2023-07-16 — End: 2023-07-19
  Administered 2023-07-16 – 2023-07-19 (×4): 200 mg via ORAL
  Filled 2023-07-15 (×4): qty 1

## 2023-07-15 NOTE — Progress Notes (Signed)
 [facility]  ROUNDING NOTE   Subjective:   [name] was admitted to [facility] on 07/13/2023 for Renal mass [N28.89] Acute respiratory failure with hypoxia (HCC) [J96.01] Pneumonia of right lung due to infectious organism, unspecified part of lung [J18.9] Acute hypoxic respiratory failure (HCC) [J96.01]  Last hemodialysis treatment was Friday. Patient received 2:45 minutes of prescribed dialysis treatment. Patient is 2 kg above her dry weight.   Update Patient resting quietly Alert and oriented Denies shortness of breath, weaned to room air  Objective:  Vital signs in last 24 hours:  Temp:  [98.1 F (36.7 C)-99 F (37.2 C)] 98.1 F (36.7 C) (07/08 1157) Pulse Rate:  [62-88] 77 (07/08 1222) Resp:  [16-23] 18 (07/08 1157) BP: (100-144)/(63-85) 134/81 (07/08 1222) SpO2:  [95 %-100 %] 99 % (07/08 1157) Weight:  [52.5 kg-53.7 kg] 53.7 kg (07/08 0500)  Weight change:  Filed Weights   07/14/23 1334 07/14/23 1652 07/15/23 0500  Weight: 54.2 kg 52.5 kg 53.7 kg    Intake/Output: I/O last 3 completed shifts: In: 1949.8 [P.O.:480; I.V.:1000; IV Piggyback:469.8] Out: 1500 [Other:1500]   Intake/Output this shift:  Total I/O In: 240 [P.O.:240] Out: -   Physical Exam: General: NAD  Head: Normocephalic, atraumatic. Moist oral mucosal membranes  Eyes: Anicteric  Lungs:  wheezes on left. 4L  O2.  Heart: Regular rate and rhythm  Abdomen:  Soft, nontender  Extremities:  no peripheral edema.  Neurologic: Alert, awake  Skin: No lesions  Access: Right femoral permcath    Basic Metabolic Panel: Recent Labs  Lab 07/13/23 0645  NA 136  K 3.7  CL 99  CO2 21*  GLUCOSE 103*  BUN 29*  CREATININE 7.62*  CALCIUM  10.6*    Liver Function Tests: Recent Labs  Lab 07/13/23 0645  AST 26  ALT 25  ALKPHOS 76  BILITOT 0.9  PROT 7.1  ALBUMIN  3.5   Recent Labs  Lab 07/13/23 0645  LIPASE 25   No results for input(s): AMMONIA in the last 168  hours.  CBC: Recent Labs  Lab 07/13/23 0645  WBC 11.7*  NEUTROABS 9.1*  HGB 10.3*  HCT 30.4*  MCV 85.2  PLT 148*    Cardiac Enzymes: No results for input(s): CKTOTAL, CKMB, CKMBINDEX, TROPONINI in the last 168 hours.  BNP: Invalid input(s): POCBNP  CBG: Recent Labs  Lab 07/14/23 1132 07/14/23 1748 07/14/23 2128 07/15/23 0810 07/15/23 1157  GLUCAP 123* 80 135* 107* 112*    Microbiology: Results for orders placed or performed during the hospital encounter of 07/13/23  Resp panel by RT-PCR (RSV, Flu A&B, Covid) Anterior Nasal Swab     Status: None   Collection Time: 07/13/23  6:45 AM   Specimen: Anterior Nasal Swab  Result Value Ref Range Status   SARS Coronavirus 2 by RT PCR NEGATIVE NEGATIVE Final    Comment: (NOTE) SARS-CoV-2 target nucleic acids are NOT DETECTED.  The SARS-CoV-2 RNA is generally detectable in upper respiratory specimens during the acute phase of infection. The lowest concentration of SARS-CoV-2 viral copies this assay can detect is 138 copies/mL. A negative result does not preclude SARS-Cov-2 infection and should not be used as the sole basis for treatment or other patient management decisions. A negative result may occur with  improper specimen collection/handling, submission of specimen other than nasopharyngeal swab, presence of viral mutation(s) within the areas targeted by this assay, and inadequate number of viral copies(<138 copies/mL). A negative result must be combined with clinical observations, patient history, and  epidemiological information. The expected result is Negative.  Fact Sheet for Patients:  BloggerCourse.com  Fact Sheet for Healthcare Providers:  SeriousBroker.it  This test is no t yet approved or cleared by the [place]  FDA and  has been authorized for detection and/or diagnosis of SARS-CoV-2 by FDA under an Emergency Use Authorization (EUA). This EUA  will remain  in effect (meaning this test can be used) for the duration of the COVID-19 declaration under Section 564(b)(1) of the Act, 21 U.S.C.section 360bbb-3(b)(1), unless the authorization is terminated  or revoked sooner.       Influenza A by PCR NEGATIVE NEGATIVE Final   Influenza B by PCR NEGATIVE NEGATIVE Final    Comment: (NOTE) The Xpert Xpress SARS-CoV-2/FLU/RSV plus assay is intended as an aid in the diagnosis of influenza from Nasopharyngeal swab specimens and should not be used as a sole basis for treatment. Nasal washings and aspirates are unacceptable for Xpert Xpress SARS-CoV-2/FLU/RSV testing.  Fact Sheet for Patients: BloggerCourse.com  Fact Sheet for Healthcare Providers: SeriousBroker.it  This test is not yet approved or cleared by the [place]  FDA and has been authorized for detection and/or diagnosis of SARS-CoV-2 by FDA under an Emergency Use Authorization (EUA). This EUA will remain in effect (meaning this test can be used) for the duration of the COVID-19 declaration under Section 564(b)(1) of the Act, 21 U.S.C. section 360bbb-3(b)(1), unless the authorization is terminated or revoked.     Resp Syncytial Virus by PCR NEGATIVE NEGATIVE Final    Comment: (NOTE) Fact Sheet for Patients: BloggerCourse.com  Fact Sheet for Healthcare Providers: SeriousBroker.it  This test is not yet approved or cleared by the [place]  FDA and has been authorized for detection and/or diagnosis of SARS-CoV-2 by FDA under an Emergency Use Authorization (EUA). This EUA will remain in effect (meaning this test can be used) for the duration of the COVID-19 declaration under Section 564(b)(1) of the Act, 21 U.S.C. section 360bbb-3(b)(1), unless the authorization is terminated or revoked.  Performed at [facility], [address] [place], [place]  [address]   Blood culture (routine x 2)     Status: None (Preliminary result)   Collection Time: 07/13/23  9:30 AM   Specimen: BLOOD  Result Value Ref Range Status   Specimen Description BLOOD BLOOD LEFT FOREARM  Final   Special Requests   Final    BOTTLES DRAWN AEROBIC AND ANAEROBIC Blood Culture results may not be optimal due to an inadequate volume of blood received in culture bottles   Culture   Final    NO GROWTH 2 DAYS Performed at [facility], [address] [place], [place] [address]    Report Status PENDING  Incomplete  Blood culture (routine x 2)     Status: None (Preliminary result)   Collection Time: 07/13/23  9:40 AM   Specimen: BLOOD  Result Value Ref Range Status   Specimen Description BLOOD BLOOD RIGHT HAND  Final   Special Requests   Final    BOTTLES DRAWN AEROBIC AND ANAEROBIC Blood Culture results may not be optimal due to an inadequate volume of blood received in culture bottles   Culture   Final    NO GROWTH 2 DAYS Performed at [facility], [address], [place], [place] [address]    Report Status PENDING  Incomplete  MRSA Next Gen by PCR, Nasal     Status: None   Collection Time: 07/14/23  5:30 AM   Specimen: Nasal Mucosa; Nasal  Swab  Result Value Ref Range Status   MRSA by PCR Next Gen NOT DETECTED NOT DETECTED Final    Comment: (NOTE) The GeneXpert MRSA Assay (FDA approved for NASAL specimens only), is one component of a comprehensive MRSA colonization surveillance program. It is not intended to diagnose MRSA infection nor to guide or monitor treatment for MRSA infections. Test performance is not FDA approved in patients less than 72 years old. Performed at [facility], [address] [place], [place] [address]     Coagulation Studies: No results for input(s): LABPROT, INR in the last 72 hours.  Urinalysis: No results for input(s): COLORURINE, LABSPEC, PHURINE, GLUCOSEU, HGBUR, BILIRUBINUR,  KETONESUR, PROTEINUR, UROBILINOGEN, NITRITE, LEUKOCYTESUR in the last 72 hours.  Invalid input(s): APPERANCEUR    Imaging: ECHOCARDIOGRAM COMPLETE Result Date: 07/15/2023    ECHOCARDIOGRAM REPORT   Patient Name:   Teresa Cook Date of Exam: 07/15/2023 Medical Rec #:  [id number]       Height:       65.0 in Accession #:    [id number]      Weight:       118.4 lb Date of Birth:  Sep 08, 1955       BSA:          1.583 m Patient Age:    68 years        BP:           112/63 mmHg Patient Gender: F               HR:           68 bpm. Exam Location:  [facility] Procedure: 2D Echo, Cardiac Doppler and Color Doppler (Both Spectral and Color            Flow Doppler were utilized during procedure). Indications:     Dyspnea R06.00  History:         Patient has prior history of Echocardiogram examinations, most                  recent 11/07/2021. CHF; Risk Factors:Hypertension.  Sonographer:     Christopher Furnace Referring Phys:  [id number] AVA SWAYZE Diagnosing Phys: Deatrice Cage MD IMPRESSIONS  1. Left ventricular ejection fraction, by estimation, is 35 to 40%. The left ventricle has moderately decreased function. The left ventricle demonstrates global hypokinesis. There is mild left ventricular hypertrophy. Left ventricular diastolic parameters are consistent with Grade II diastolic dysfunction (pseudonormalization).  2. Right ventricular systolic function is normal. The right ventricular size is normal. Tricuspid regurgitation signal is inadequate for assessing PA pressure.  3. Left atrial size was moderately dilated.  4. 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 No evidence of mitral stenosis. Moderate mitral annular calcification.  5. The aortic valve is calcified. Aortic valve regurgitation is trivial. Aortic valve sclerosis/calcification is present, without any evidence of aortic stenosis.  6. The inferior vena cava is normal in size with greater than 50% respiratory variability, suggesting right  atrial pressure of 3 mmHg. FINDINGS  Left Ventricle: Left ventricular ejection fraction, by estimation, is 35 to 40%. The left ventricle has moderately decreased function. The left ventricle demonstrates global hypokinesis. The left ventricular internal cavity size was normal in size. There is mild left ventricular hypertrophy. Left ventricular diastolic parameters are consistent with Grade II diastolic dysfunction (pseudonormalization). Right Ventricle: The right ventricular size is normal. No increase in right ventricular wall thickness. Right ventricular systolic function is normal. Tricuspid regurgitation signal is inadequate for assessing  PA pressure. The tricuspid regurgitant velocity is 1.82 m/s, and with an assumed right atrial pressure of 3 mmHg, the estimated right ventricular systolic pressure is 16.2 mmHg. Left Atrium: Left atrial size was moderately dilated. Right Atrium: Right atrial size was normal in size. Pericardium: There is no evidence of pericardial effusion. Mitral Valve: The mitral valve is myxomatous. There is moderate thickening of the mitral valve leaflet(s). Moderate mitral annular calcification. Moderate mitral valve regurgitation. No evidence of mitral valve stenosis. MV peak gradient, 6.9 mmHg. The mean mitral valve gradient is 3.0 mmHg. Tricuspid Valve: The tricuspid valve is normal in structure. Tricuspid valve regurgitation is trivial. No evidence of tricuspid stenosis. Aortic Valve: The aortic valve is calcified. Aortic valve regurgitation is trivial. Aortic valve sclerosis/calcification is present, without any evidence of aortic stenosis. Aortic valve mean gradient measures 4.0 mmHg. Aortic valve peak gradient measures 5.9 mmHg. Aortic valve area, by VTI measures 2.04 cm. Pulmonic Valve: The pulmonic valve was normal in structure. Pulmonic valve regurgitation is not visualized. No evidence of pulmonic stenosis. Aorta: The aortic root is normal in size and structure. Venous: The  inferior vena cava is normal in size with greater than 50% respiratory variability, suggesting right atrial pressure of 3 mmHg. IAS/Shunts: No atrial level shunt detected by color flow Doppler.  LEFT VENTRICLE PLAX 2D LVIDd:         3.65 cm   Diastology LVIDs:         2.42 cm   LV e' medial:    3.87 cm/s LV PW:         1.31 cm   LV E/e' medial:  28.9 LV IVS:        1.34 cm   LV e' lateral:   4.06 cm/s LVOT diam:     2.00 cm   LV E/e' lateral: 27.6 LV SV:         49 LV SV Index:   31 LVOT Area:     3.14 cm  RIGHT VENTRICLE RV Basal diam:  3.42 cm RV Mid diam:    2.95 cm LEFT ATRIUM            Index         RIGHT ATRIUM           Index LA diam:      3.20 cm  2.02 cm/m    RA Area:     14.80 cm LA Vol (A2C): 170.0 ml 107.39 ml/m  RA Volume:   36.00 ml  22.74 ml/m LA Vol (A4C): 69.0 ml  43.59 ml/m  AORTIC VALVE AV Area (Vmax):    1.99 cm AV Area (Vmean):   1.93 cm AV Area (VTI):     2.04 cm AV Vmax:           121.47 cm/s AV Vmean:          86.600 cm/s AV VTI:            0.242 m AV Peak Grad:      5.9 mmHg AV Mean Grad:      4.0 mmHg LVOT Vmax:         77.00 cm/s LVOT Vmean:        53.200 cm/s LVOT VTI:          0.157 m LVOT/AV VTI ratio: 0.65  AORTA Ao Root diam: 3.70 cm MITRAL VALVE                TRICUSPID VALVE MV Area (PHT): 3.85  cm     TR Peak grad:   13.2 mmHg MV Area VTI:   1.55 cm     TR Vmax:        182.00 cm/s MV Peak grad:  6.9 mmHg MV Mean grad:  3.0 mmHg     SHUNTS MV Vmax:       1.31 m/s     Systemic VTI:  0.16 m MV Vmean:      79.6 cm/s    Systemic Diam: 2.00 cm MV Decel Time: 197 msec MV E velocity: 112.00 cm/s MV A velocity: 63.50 cm/s MV E/A ratio:  1.76 Deatrice Cage MD Electronically signed by Deatrice Cage MD Signature Date/Time: 07/15/2023/1:22:57 PM    Final    DG ESOPHAGUS W SINGLE CM (SOL OR THIN BA) Result Date: 07/14/2023 CLINICAL DATA:  [id number] Dysphagia 2860 68 year old female. Endorsing worsening dysphagia with solids. Request is for esophagram for further evaluation EXAM:  ESOPHAGUS/BARIUM SWALLOW/TABLET STUDY TECHNIQUE: Single contrast examination was performed using thin liquid barium. This exam was performed by Delon Beagle NP, and was supervised and interpreted by Dr. Thom Hall. FLUOROSCOPY: Radiation Exposure Index and estimated peak skin dose (PSD); Reference air kerma (RAK), 14.2 mGy. Kerma-area product (KAP), 292.1 uGy*m. COMPARISON:  Chest XR, 07/13/2023.  CTA CAP, 11/05/2021. FINDINGS: Swallowing: Appears normal. No vestibular penetration or aspiration seen. Pharynx: Unremarkable. Esophagus: Normal appearance. Esophageal motility: Mild esophageal dysmotility, with tertiary contractions. Otherwise, within normal limits. Hiatal Hernia: None. Gastroesophageal reflux: None visualized. Ingested 13mm barium tablet: Passed normally Other: None. IMPRESSION: No masses, lesions or significant dysmotility. Electronically Signed   By: Thom Hall M.D.   On: 07/14/2023 10:55     Medications:    azithromycin  500 mg (07/15/23 9388)   ceFEPime  (MAXIPIME ) IV 1 g (07/15/23 1243)    amiodarone   200 mg Oral BID   apixaban   2.5 mg Oral BID   carvedilol   12.5 mg Oral BID   Chlorhexidine  Gluconate Cloth  6 each Topical Q0600   insulin  aspart  0-6 Units Subcutaneous TID WC   multivitamin  1 tablet Oral Daily   pantoprazole   40 mg Oral Daily   sacubitril -valsartan   1 tablet Oral BID   sertraline   150 mg Oral Daily   traZODone   50 mg Oral QHS   zolpidem   5 mg Oral QHS   acetaminophen , HYDROcodone -acetaminophen , HYDROmorphone  (DILAUDID ) injection  Assessment/ Plan:  [name] is a 68 y.o.  female with end stage renal disease on hemodialysis, hypertension, hyperlipidemia, congestive heart failure, atrial fibrillation, gout, and DVT who is admitted to [facility] on 07/13/2023 for Renal mass [N28.89] Acute respiratory failure with hypoxia (HCC) [J96.01] Pneumonia of right lung due to infectious organism, unspecified part of lung [J18.9] Acute hypoxic respiratory  failure (HCC) [J96.01]  [facility] Right femoral catheter 55kg.   End Stage Renal Disease: 2kg above target weight and acute pulmonary failure.  - Dialysis received yesterday, UF 1.5 L.  Postweight of 52.5 kg.  Will adjust outpatient dry weight. - Next treatment scheduled for Wednesday  Acute Respiratory failure: requiring [place] O2. With pulmonary edema and right upper lobe pneumonia.  - empiric antibiotics - Weaned to room air  Hypertension with chronic kidney disease: - recommend restarting home medications after dialysis.  - home regimen of isosorbide  mononitrate, Entresto , and carvedilol   Secondary Hyperparathyroidism with hypercalcemia.  - receives etelcalcetide as outpatient.  - sevelamer  with meals.   Renal mass: right. Consistent with RCC. Incidental finding.  - Consult urology for  outpatient follow up.  - hold esa.     LOS: 2 Faith Harris 7/8/20251:45 PM

## 2023-07-15 NOTE — Progress Notes (Signed)
*  PRELIMINARY RESULTS* Echocardiogram 2D Echocardiogram has been performed.  Teresa Cook 07/15/2023, 7:52 AM

## 2023-07-15 NOTE — Consult Note (Signed)
 Cardiology Consultation   Patient ID: Teresa Cook MRN: [id number]; DOB: Feb 19, 1955  Admit date: 07/13/2023 Date of Consult: 07/15/2023  PCP:  [facility] Providers Cardiologist: [facility]  Patient Profile: Teresa Cook is a 68 y.o. female with a hx of ESRD on HD on Monday Wednesday and Friday, anemia secondary to ESRD, anxiety, HFrEF, depression, DVT in the right thigh and left leg, hypertension, gout, parathyroid  disease, sickle cell trait who is being seen 07/15/2023 for the evaluation of CHF and elevated troponin at the request of Dr. Jens.  History of Present Illness: [name] does not regularly follow with cardiology.  Patient was previously seen by [facility].  It appears she has a long history of heart failure with reduced pump function.  Echo 08/2014: LVEF 45 to 50%, grade 1 diastolic dysfunction Echo 07/2015: LVEF 40 to 45%, grade 1 diastolic dysfunction, trivial AI, mild MR Myoview  Lexi scan 08/2015: Normal study, low risk, EF 55 to 65% Echo 01/2016: LVEF 40 to 45%, grade 1 diastolic dysfunction, moderate MR/TR Echo 09/2016: LVEF 30 to 35%, grade 2 diastolic dysfunction, mild MR, moderate MR with trivial pericardial effusion Myoview  Lexiscan  09/2016: Small region of fixed apical thinning on attenuation corrected images otherwise normal perfusion, normal wall motion, EF 44%, no EKG changes concerning for ischemia, low risk scan Echo 01/2020: LVEF 40 to 45%, moderate LVH R/L heart cath 03/2020: Proximal circumflex mid circumflex lesion 60% stenosis.  Right heart cath showed normal filling pressures, normal pulmonary pressure normal CO.  Patient started following with [facility] 07/2021 during an admission for acute respiratory failure, flash pulmonary edema, acute on chronic systolic CHF, hypertensive urgency, elevated troponin, atypical pneumonia, chronic anemia.  Patient remained in normal sinus rhythm. Echo 07/26/2021 showed EF of 50 to  55%, grade 2 diastolic dysfunction.  No further workup was pursued.  Patient was most recently seen in 10/2021 during a hospital admission for A-fib RVR and nonsustained VT in the setting of hemorrhagic shock of right renal hemorrhage, HFpEF, acute blood loss anemia.  Patient was started on amiodarone  and warfarin was changed to Eliquis .  Echo showed EF 50 to 55%, grade 1 diastolic dysfunction.  The patient presented to [facility] 07/13/2023 with chest pressure and shortness of breath.  Patient reports compliance with hemodialysis, but felt not enough fluid was being taken off.  For this reason she felt she was accumulating fluid in worsening shortness of breath.  No lower leg swelling noted.  She reports constant sharp chest pain that comes from the right underarm.  She also feels chest is tight.  She does not feel this is from acid reflux or volume overload.  Patient also reports dry heaving and nausea every time she eats.  She denies any bleeding issues.  No fevers or chills.  EMS was called and she was found to be hypoxic placed on 5 L nasal cannula and transition to nonrebreather.  In the ER patient was transition to BiPAP.  Labs showed CO2 21, serum creatinine 7.62, BUN 29, calcium  10.6, WBC 11.7, hemoglobin 10.3.  BNP 1279.  High-sensitivity troponin 40>48.  Chest x-ray showed small bilateral pleural effusions and diffuse increased interstitial markings compatible with pulmonary edema.  CTA chest/abdomen/pelvis showed no PE, moderate bilateral pleural effusions with associated atelectasis or chronic consolidation, diffuse subpleural interlobular thickening throughout both lungs consistent with pulmonary edema, extensive heterogeneous and groundglass airspace disease throughout the right upper lobe consistent with superimposed infection or aspiration.  There was  also an ascending thoracic aortic aneurysm with recommendation for semiannual imaging.  There were severely atrophic cystic kidneys consistent with  chronic renal disease and dialysis.  There was also an enhancing mass arising from the inferior pole of the right kidney consistent with renal cell carcinoma.  Multiphasic contrast-enhanced CT or MRI was recommended.  She was started on antibiotics given IV morphine  and weaned to 4 L O2.  Esophagus/barium swallow/tablet study showed no masses, lesions or significant dysmotility.   Past Medical History:  Diagnosis Date   Anemia    Anxiety    Breast cancer (HCC) 01/2016   bilateral   CHF (congestive heart failure) (HCC)    Chronic kidney disease    Depression    Dialysis patient [facility])    DVT (deep venous thrombosis) (HCC)    left leg   DVT (deep venous thrombosis) (HCC) 1985   right thigh   Dysrhythmia    Gout    Headache    HTN (hypertension)    Hypertension    Nausea vomiting and diarrhea    Parathyroid  abnormality (HCC)    Parathyroid  disease (HCC)    Pneumonia 12/2015   Psoriasis    Renal insufficiency    Sickle cell trait (HCC)    traits    Past Surgical History:  Procedure Laterality Date   ABDOMINAL HYSTERECTOMY  1980   APPENDECTOMY     BREAST BIOPSY Left 10/28/2013   benign   BREAST EXCISIONAL BIOPSY Left 2002   benign   CORONARY ANGIOGRAPHY N/A 04/04/2020   Procedure: CORONARY ANGIOGRAPHY;  Surgeon: Darron Deatrice LABOR, MD;  Location: [facility];  Service: Cardiovascular;  Laterality: N/A;   CYSTOSCOPY N/A 12/06/2021   Procedure: CYSTOSCOPY;  Surgeon: Twylla Glendia BROCKS, MD;  Location: [facility];  Service: Urology;  Laterality: N/A;   DIALYSIS/PERMA CATHETER INSERTION N/A 02/27/2022   Procedure: DIALYSIS/PERMA CATHETER INSERTION;  Surgeon: Marea Selinda RAMAN, MD;  Location: [facility];  Service: Cardiovascular;  Laterality: N/A;   INSERTION OF DIALYSIS CATHETER  2014   LIPOMA EXCISION N/A 01/23/2016   Procedure: EXCISION LIPOMA;  Surgeon: Dorothyann LITTIE Husk, MD;  Location: [facility];  Service: General;  Laterality: N/A;   MASTECTOMY W/ SENTINEL NODE  BIOPSY Bilateral 01/23/2016   Procedure: bilateral MASTECTOMY WITH  bilateral SENTINEL LYMPH NODE BIOPSY possible left axillary node dissection forehead lipoma removal;  Surgeon: Dorothyann LITTIE Husk, MD;  Location: [facility];  Service: General;  Laterality: Bilateral;   PARTIAL HYSTERECTOMY     PERIPHERAL VASCULAR CATHETERIZATION N/A 12/25/2015   Procedure: Dialysis/Perma Catheter Insertion;  Surgeon: Selinda RAMAN Marea, MD;  Location: [facility];  Service: Cardiovascular;  Laterality: N/A;   PERIPHERAL VASCULAR CATHETERIZATION Left 01/22/2016   Procedure: Dialysis/Perma Catheter Insertion;  Surgeon: Selinda RAMAN Marea, MD;  Location: [facility];  Service: Cardiovascular;  Laterality: Left;   PERIPHERAL VASCULAR CATHETERIZATION N/A 01/26/2016   Procedure: Dialysis/Perma Catheter Insertion;  Surgeon: Cordella KANDICE Shawl, MD;  Location: [facility];  Service: Cardiovascular;  Laterality: N/A;   PORT-A-CATH REMOVAL N/A 12/20/2015   Procedure: REMOVAL PORT-A-CATH;  Surgeon: Dorothyann LITTIE Husk, MD;  Location: [facility];  Service: General;  Laterality: N/A;  left     PORTACATH PLACEMENT Left 08/21/2015   Procedure: INSERTION PORT-A-CATH;  Surgeon: Dorothyann LITTIE Husk, MD;  Location: [facility];  Service: General;  Laterality: Left;   REMOVAL OF A DIALYSIS CATHETER  2017   RENAL ANGIOGRAPHY Right 11/05/2021   Procedure: RENAL ANGIOGRAPHY;  Surgeon: Marea Selinda RAMAN, MD;  Location: [facility];  Service: Cardiovascular;  Laterality: Right;  with embolization   RIGHT HEART CATH N/A 04/04/2020   Procedure: RIGHT HEART CATH;  Surgeon: Darron Deatrice LABOR, MD;  Location: [facility];  Service: Cardiovascular;  Laterality: N/A;   URETEROSCOPY Bilateral 12/06/2021   Procedure: URETEROSCOPY;  Surgeon: Twylla Glendia BROCKS, MD;  Location: [facility];  Service: Urology;  Laterality: Bilateral;     Home Medications:  Prior to Admission medications   Medication Sig Start Date End Date Taking?  Authorizing Provider  amiodarone  (PACERONE ) 200 MG tablet Take 1 tablet (200 mg total) by mouth 2 (two) times daily. 11/22/21  Yes Patel, Sona, MD  apixaban  (ELIQUIS ) 2.5 MG TABS tablet Take 1 tablet (2.5 mg total) by mouth 2 (two) times daily. 12/06/21  Yes Alexander, Natalie, DO  b complex-vitamin c-folic acid  (NEPHRO-VITE) 0.8 MG TABS tablet Take 1 tablet by mouth daily.   Yes [provider]  carvedilol  (COREG ) 12.5 MG tablet Take 12.5 mg by mouth 2 (two) times daily.   Yes [provider]  midodrine (PROAMATINE) 10 MG tablet Take 10 mg by mouth every Monday, Wednesday, and Friday with hemodialysis.   Yes [provider]  pantoprazole  (PROTONIX ) 40 MG tablet Take 1 tablet (40 mg total) by mouth 2 (two) times daily. Patient taking differently: Take 40 mg by mouth daily. 07/30/21  Yes Wieting, Richard, MD  sacubitril -valsartan  (ENTRESTO ) 24-26 MG Take 1 tablet by mouth 2 (two) times daily. 02/15/21  Yes Agbor-Etang, Redell, MD  sertraline  (ZOLOFT ) 100 MG tablet Take 150 mg by mouth daily.   Yes [provider]  traZODone  (DESYREL ) 50 MG tablet Take 50 mg by mouth at bedtime. 03/01/21  Yes [provider]  zolpidem  (AMBIEN ) 10 MG tablet Take 0.5 tablets (5 mg total) by mouth at bedtime. Patient taking differently: Take 10 mg by mouth at bedtime. 07/30/21  Yes Josette Ade, MD    Scheduled Meds:  amiodarone   200 mg Oral BID   apixaban   2.5 mg Oral BID   carvedilol   12.5 mg Oral BID   Chlorhexidine  Gluconate Cloth  6 each Topical Q0600   insulin  aspart  0-6 Units Subcutaneous TID WC   multivitamin  1 tablet Oral Daily   pantoprazole   40 mg Oral Daily   sacubitril -valsartan   1 tablet Oral BID   sertraline   150 mg Oral Daily   traZODone   50 mg Oral QHS   zolpidem   5 mg Oral QHS   Continuous Infusions:  azithromycin  500 mg (07/15/23 9388)   ceFEPime  (MAXIPIME ) IV 1 g (07/15/23 1243)   PRN Meds: acetaminophen , HYDROcodone -acetaminophen ,  HYDROmorphone  (DILAUDID ) injection  Allergies:    Allergies  Allergen Reactions   Amlodipine  Palpitations   Gabapentin  Other (See Comments)    Seizure   Hydrochlorothiazide     Other Reaction(s): gout   Atorvastatin      Other Reaction(s): myalgias   Lisinopril Cough   Adhesive [Tape] Itching    Silk tape is ok to use.    Social History:   Social History   Socioeconomic History   Marital status: Widowed    Spouse name: Not on file   Number of children: 2   Years of education: Not on file   Highest education level: Not on file  Occupational History   Occupation: disabled  Tobacco Use   Smoking status: Never   Smokeless tobacco: Never  Vaping Use   Vaping status: Never Used  Substance and Sexual Activity   Alcohol use: No    Alcohol/week: 0.0 standard drinks of alcohol   Drug use: No   Sexual activity: Never    Birth control/protection: Surgical  Other Topics Concern   Not on file  Social History Narrative   ** Merged History Encounter **    Lives by herself; has 2 daughters    Social Drivers of Corporate investment banker Strain: Not on file  Food Insecurity: No Food Insecurity (07/14/2023)   Hunger Vital Sign    Worried About Running Out of Food in the Last Year: Never true    Ran Out of Food in the Last Year: Never true  Transportation Needs: No Transportation Needs (07/14/2023)   PRAPARE - Administrator, Civil Service (Medical): No    Lack of Transportation (Non-Medical): No  Physical Activity: Not on file  Stress: Not on file  Social Connections: Moderately Isolated (07/14/2023)   Social Connection and Isolation Panel    Frequency of Communication with Friends and Family: More than three times a week    Frequency of Social Gatherings with Friends and Family: More than three times a week    Attends Religious Services: More than 4 times per year    Active Member of [employer] or Organizations: No    Attends Banker Meetings: Never     Marital Status: Widowed  Intimate Partner Violence: Not At Risk (07/14/2023)   Humiliation, Afraid, Rape, and Kick questionnaire    Fear of Current or Ex-Partner: No    Emotionally Abused: No    Physically Abused: No    Sexually Abused: No    Family History:    Family History  Problem Relation Age of Onset   Stroke Mother    CVA Mother    Hypertension Mother    Hypertension Father    Hypertension Sister    Diabetes Sister    Cancer Sister 31       Breast   Stroke Brother    Hypertension Brother    Breast cancer Maternal Aunt 70     ROS:  Please see the history of present illness.   All other ROS reviewed and negative.     Physical Exam/Data: Vitals:   07/15/23 0809 07/15/23 1157 07/15/23 1222 07/15/23 1411  BP: 112/75 134/81 134/81 90/60  Pulse: 65 77 77 78  Resp: 18 18  16   Temp: 98.3 F (36.8 C) 98.1 F (36.7 C)  98.7 F (37.1 C)  TempSrc:      SpO2: 100% 99%  95%  Weight:      Height:        Intake/Output Summary (Last 24 hours) at 07/15/2023 1514 Last data filed at 07/15/2023 1300 Gross per 24 hour  Intake 600 ml  Output 1500 ml  Net -900 ml      07/15/2023    5:00 AM 07/14/2023    4:52 PM 07/14/2023    1:34 PM  Last 3 Weights  Weight (lbs) 118 lb 6.2 oz 115 lb 11.9 oz 119 lb 7.8 oz  Weight (kg) 53.7 kg 52.5 kg 54.2 kg     Body mass index is 19.7 kg/m.  General:  Well nourished, well developed, in no acute distress HEENT: normal Neck: + JVD Vascular: No carotid bruits; Distal pulses 2+ bilaterally Cardiac:  normal S1, S2; RRR; + murmur  Lungs:  clear to auscultation bilaterally, no wheezing, rhonchi or rales  Abd: soft, nontender, no hepatomegaly  Ext:  no edema Musculoskeletal:  No deformities, BUE and BLE strength normal and equal Skin: warm and dry  Neuro:  CNs 2-12 intact, no focal abnormalities noted Psych:  Normal affect   EKG:  The EKG from 7/6 was personally reviewed and demonstrates:  Nsr, 90bpm LVH with repol, PAC and PVC, Qtc  619 Telemetry:  Telemetry was personally reviewed and demonstrates:  Nsr HR 70s  Relevant CV Studies:  Echo 07/15/23  1. Left ventricular ejection fraction, by estimation, is 35 to 40%. The  left ventricle has moderately decreased function. The left ventricle  demonstrates global hypokinesis. There is mild left ventricular  hypertrophy. Left ventricular diastolic  parameters are consistent with Grade II diastolic dysfunction  (pseudonormalization).   2. Right ventricular systolic function is normal. The right ventricular  size is normal. Tricuspid regurgitation signal is inadequate for assessing  PA pressure.   3. Left atrial size was moderately dilated.   4. 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  No evidence of mitral stenosis. Moderate mitral annular calcification.   5. The aortic valve is calcified. Aortic valve regurgitation is trivial.  Aortic valve sclerosis/calcification is present, without any evidence of  aortic stenosis.   6. The inferior vena cava is normal in size with greater than 50%  respiratory variability, suggesting right atrial pressure of 3 mmHg.   Laboratory Data: High Sensitivity Troponin:   Recent Labs  Lab 07/13/23 0645 07/13/23 0836  TROPONINIHS 40* 48*     Chemistry Recent Labs  Lab 07/13/23 0645  NA 136  K 3.7  CL 99  CO2 21*  GLUCOSE 103*  BUN 29*  CREATININE 7.62*  CALCIUM  10.6*  GFRNONAA 5*  ANIONGAP 16*    Recent Labs  Lab 07/13/23 0645  PROT 7.1  ALBUMIN  3.5  AST 26  ALT 25  ALKPHOS 76  BILITOT 0.9   Lipids No results for input(s): CHOL, TRIG, HDL, LABVLDL, LDLCALC, CHOLHDL in the last 168 hours.  Hematology Recent Labs  Lab 07/13/23 0645  WBC 11.7*  RBC 3.57*  HGB 10.3*  HCT 30.4*  MCV 85.2  MCH 28.9  MCHC 33.9  RDW 16.1*  PLT 148*   Thyroid  No results for input(s): TSH, FREET4 in the last 168 hours.  BNP Recent Labs  Lab 07/13/23 0645  BNP 1,279.7*    DDimer No  results for input(s): DDIMER in the last 168 hours.  Radiology/Studies:  ECHOCARDIOGRAM COMPLETE Result Date: 07/15/2023    ECHOCARDIOGRAM REPORT   Patient Name:   Teresa Cook Date of Exam: 07/15/2023 Medical Rec #:  [id number]       Height:       65.0 in Accession #:    [id number]      Weight:       118.4 lb Date of Birth:  Jan 02, 1956       BSA:          1.583 m Patient Age:    68 years        BP:           112/63 mmHg Patient Gender: F               HR:           68 bpm. Exam Location:  [facility] Procedure: 2D Echo, Cardiac Doppler and Color Doppler (Both Spectral and Color            Flow Doppler were utilized during procedure). Indications:     Dyspnea R06.00  History:  Patient has prior history of Echocardiogram examinations, most                  recent 11/07/2021. CHF; Risk Factors:Hypertension.  Sonographer:     Christopher Furnace Referring Phys:  [id number] AVA SWAYZE Diagnosing Phys: Deatrice Cage MD IMPRESSIONS  1. Left ventricular ejection fraction, by estimation, is 35 to 40%. The left ventricle has moderately decreased function. The left ventricle demonstrates global hypokinesis. There is mild left ventricular hypertrophy. Left ventricular diastolic parameters are consistent with Grade II diastolic dysfunction (pseudonormalization).  2. Right ventricular systolic function is normal. The right ventricular size is normal. Tricuspid regurgitation signal is inadequate for assessing PA pressure.  3. Left atrial size was moderately dilated.  4. 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 No evidence of mitral stenosis. Moderate mitral annular calcification.  5. The aortic valve is calcified. Aortic valve regurgitation is trivial. Aortic valve sclerosis/calcification is present, without any evidence of aortic stenosis.  6. The inferior vena cava is normal in size with greater than 50% respiratory variability, suggesting right atrial pressure of 3 mmHg. FINDINGS  Left Ventricle: Left  ventricular ejection fraction, by estimation, is 35 to 40%. The left ventricle has moderately decreased function. The left ventricle demonstrates global hypokinesis. The left ventricular internal cavity size was normal in size. There is mild left ventricular hypertrophy. Left ventricular diastolic parameters are consistent with Grade II diastolic dysfunction (pseudonormalization). Right Ventricle: The right ventricular size is normal. No increase in right ventricular wall thickness. Right ventricular systolic function is normal. Tricuspid regurgitation signal is inadequate for assessing PA pressure. The tricuspid regurgitant velocity is 1.82 m/s, and with an assumed right atrial pressure of 3 mmHg, the estimated right ventricular systolic pressure is 16.2 mmHg. Left Atrium: Left atrial size was moderately dilated. Right Atrium: Right atrial size was normal in size. Pericardium: There is no evidence of pericardial effusion. Mitral Valve: The mitral valve is myxomatous. There is moderate thickening of the mitral valve leaflet(s). Moderate mitral annular calcification. Moderate mitral valve regurgitation. No evidence of mitral valve stenosis. MV peak gradient, 6.9 mmHg. The mean mitral valve gradient is 3.0 mmHg. Tricuspid Valve: The tricuspid valve is normal in structure. Tricuspid valve regurgitation is trivial. No evidence of tricuspid stenosis. Aortic Valve: The aortic valve is calcified. Aortic valve regurgitation is trivial. Aortic valve sclerosis/calcification is present, without any evidence of aortic stenosis. Aortic valve mean gradient measures 4.0 mmHg. Aortic valve peak gradient measures 5.9 mmHg. Aortic valve area, by VTI measures 2.04 cm. Pulmonic Valve: The pulmonic valve was normal in structure. Pulmonic valve regurgitation is not visualized. No evidence of pulmonic stenosis. Aorta: The aortic root is normal in size and structure. Venous: The inferior vena cava is normal in size with greater than 50%  respiratory variability, suggesting right atrial pressure of 3 mmHg. IAS/Shunts: No atrial level shunt detected by color flow Doppler.  LEFT VENTRICLE PLAX 2D LVIDd:         3.65 cm   Diastology LVIDs:         2.42 cm   LV e' medial:    3.87 cm/s LV PW:         1.31 cm   LV E/e' medial:  28.9 LV IVS:        1.34 cm   LV e' lateral:   4.06 cm/s LVOT diam:     2.00 cm   LV E/e' lateral: 27.6 LV SV:  49 LV SV Index:   31 LVOT Area:     3.14 cm  RIGHT VENTRICLE RV Basal diam:  3.42 cm RV Mid diam:    2.95 cm LEFT ATRIUM            Index         RIGHT ATRIUM           Index LA diam:      3.20 cm  2.02 cm/m    RA Area:     14.80 cm LA Vol (A2C): 170.0 ml 107.39 ml/m  RA Volume:   36.00 ml  22.74 ml/m LA Vol (A4C): 69.0 ml  43.59 ml/m  AORTIC VALVE AV Area (Vmax):    1.99 cm AV Area (Vmean):   1.93 cm AV Area (VTI):     2.04 cm AV Vmax:           121.47 cm/s AV Vmean:          86.600 cm/s AV VTI:            0.242 m AV Peak Grad:      5.9 mmHg AV Mean Grad:      4.0 mmHg LVOT Vmax:         77.00 cm/s LVOT Vmean:        53.200 cm/s LVOT VTI:          0.157 m LVOT/AV VTI ratio: 0.65  AORTA Ao Root diam: 3.70 cm MITRAL VALVE                TRICUSPID VALVE MV Area (PHT): 3.85 cm     TR Peak grad:   13.2 mmHg MV Area VTI:   1.55 cm     TR Vmax:        182.00 cm/s MV Peak grad:  6.9 mmHg MV Mean grad:  3.0 mmHg     SHUNTS MV Vmax:       1.31 m/s     Systemic VTI:  0.16 m MV Vmean:      79.6 cm/s    Systemic Diam: 2.00 cm MV Decel Time: 197 msec MV E velocity: 112.00 cm/s MV A velocity: 63.50 cm/s MV E/A ratio:  1.76 Deatrice Cage MD Electronically signed by Deatrice Cage MD Signature Date/Time: 07/15/2023/1:22:57 PM    Final    DG ESOPHAGUS W SINGLE CM (SOL OR THIN BA) Result Date: 07/14/2023 CLINICAL DATA:  [id number] Dysphagia 3645 68 year old female. Endorsing worsening dysphagia with solids. Request is for esophagram for further evaluation EXAM: ESOPHAGUS/BARIUM SWALLOW/TABLET STUDY TECHNIQUE: Single  contrast examination was performed using thin liquid barium. This exam was performed by Delon Beagle NP, and was supervised and interpreted by Dr. Thom Hall. FLUOROSCOPY: Radiation Exposure Index and estimated peak skin dose (PSD); Reference air kerma (RAK), 14.2 mGy. Kerma-area product (KAP), 292.1 uGy*m. COMPARISON:  Chest XR, 07/13/2023.  CTA CAP, 11/05/2021. FINDINGS: Swallowing: Appears normal. No vestibular penetration or aspiration seen. Pharynx: Unremarkable. Esophagus: Normal appearance. Esophageal motility: Mild esophageal dysmotility, with tertiary contractions. Otherwise, within normal limits. Hiatal Hernia: None. Gastroesophageal reflux: None visualized. Ingested 13mm barium tablet: Passed normally Other: None. IMPRESSION: No masses, lesions or significant dysmotility. Electronically Signed   By: Thom Hall M.D.   On: 07/14/2023 10:55   CT Angio Chest PE W/Cm &/Or Wo Cm Result Date: 07/13/2023 CLINICAL DATA:  Chest and abdominal pain for 2-4 weeks, hypoxia, dialysis * Tracking Code: BO * EXAM: CT ANGIOGRAPHY CHEST CT ABDOMEN AND PELVIS WITH CONTRAST TECHNIQUE: Multidetector CT  imaging of the chest was performed using the standard protocol during bolus administration of intravenous contrast. Multiplanar CT image reconstructions and MIPs were obtained to evaluate the vascular anatomy. Multidetector CT imaging of the abdomen and pelvis was performed using the standard protocol during bolus administration of intravenous contrast. RADIATION DOSE REDUCTION: This exam was performed according to the departmental dose-optimization program which includes automated exposure control, adjustment of the mA and/or kV according to patient size and/or use of iterative reconstruction technique. CONTRAST:  OMNIPAQUE  IOHEXOL  350 MG/ML SOLN COMPARISON:  12/05/2021 FINDINGS: CT CHEST ANGIOGRAM FINDINGS Cardiovascular: Satisfactory opacification of the pulmonary arteries to the segmental level. No evidence  of pulmonary embolism. Cardiomegaly. Left and right coronary artery calcifications. No pericardial effusion. Enlargement of the tubular ascending thoracic aorta measuring up to 4.6 x 4.5 cm. Severe aortic atherosclerosis. Mediastinum/Nodes: No enlarged mediastinal, hilar, or axillary lymph nodes. Thyroid  gland, trachea, and esophagus demonstrate no significant findings. Lungs/Pleura: Moderate bilateral pleural effusions and associated atelectasis or consolidation. Extensive heterogeneous and ground-glass airspace disease throughout the right upper lobe (series 7, image 32). Diffuse interlobular septal thickening throughout both lungs. Musculoskeletal: No chest wall abnormality. No acute osseous findings. Review of the MIP images confirms the above findings. CT ABDOMEN PELVIS FINDINGS Hepatobiliary: No solid liver abnormality is seen. No gallstones, gallbladder wall thickening, or biliary dilatation. Pancreas: Unremarkable. No pancreatic ductal dilatation or surrounding inflammatory changes. Spleen: Normal in size without significant abnormality. Adrenals/Urinary Tract: Adrenal glands are unremarkable. Severely atrophic cystic kidneys, consistent with chronic renal disease and dialysis. Enhancing mass arising from the inferior pole of the right kidney measuring 2.5 x 2.4 cm (series 12, image 23). Appearance of the posterior midportion of the left kidney is suspicious for a second more irregular mass measuring 2.4 x 1.7 cm (series 12, image 22). Bladder is unremarkable. Stomach/Bowel: Stomach is within normal limits. Appendix not clearly visualized. No evidence of bowel wall thickening, distention, or inflammatory changes. Vascular/Lymphatic: Severe aortic atherosclerosis. Large-bore right femoral approach dialysis catheter. No enlarged abdominal or pelvic lymph nodes. Reproductive: Hysterectomy. Other: No abdominal wall hernia or abnormality. Trace free fluid in the low pelvis. Musculoskeletal: No acute or  significant osseous findings. IMPRESSION: 1. Negative examination for pulmonary embolism. 2. Moderate bilateral pleural effusions and associated atelectasis or consolidation. Diffuse interlobular septal thickening throughout both lungs, findings consistent with pulmonary edema. 3. Extensive heterogeneous and ground-glass airspace disease throughout the right upper lobe, consistent with superimposed infection or aspiration. 4. Cardiomegaly and coronary artery disease. 5. Enlargement of the tubular ascending thoracic aorta measuring up to 4.6 x 4.5 cm. Ascending thoracic aortic aneurysm. Recommend semi-annual imaging followup by CTA or MRA and referral to cardiothoracic surgery if not already obtained and if clinically appropriate. This recommendation follows 2010 ACCF/AHA/AATS/ACR/ASA/SCA/SCAI/SIR/STS/SVM Guidelines for the Diagnosis and Management of Patients With Thoracic Aortic Disease. Circulation. 2010; 121: Z733-z630. Aortic aneurysm NOS (ICD10-I71.9) 6. Severely atrophic cystic kidneys, consistent with chronic renal disease and dialysis. 7. Enhancing mass arising from the inferior pole of the right kidney measuring 2.5 x 2.4 cm, consistent with renal cell carcinoma. Appearance of the posterior midportion of the left kidney is suspicious for a second more irregular mass measuring 2.4 x 1.7 cm. Recommend multiphasic contrast enhanced CT or MRI on a nonemergent, outpatient basis for more confident assessment. Aortic Atherosclerosis (ICD10-I70.0). Electronically Signed   By: Marolyn JONETTA Jaksch M.D.   On: 07/13/2023 08:47   CT ABDOMEN PELVIS W CONTRAST Result Date: 07/13/2023 CLINICAL DATA:  Chest and abdominal pain for 2-4  weeks, hypoxia, dialysis * Tracking Code: BO * EXAM: CT ANGIOGRAPHY CHEST CT ABDOMEN AND PELVIS WITH CONTRAST TECHNIQUE: Multidetector CT imaging of the chest was performed using the standard protocol during bolus administration of intravenous contrast. Multiplanar CT image reconstructions and MIPs  were obtained to evaluate the vascular anatomy. Multidetector CT imaging of the abdomen and pelvis was performed using the standard protocol during bolus administration of intravenous contrast. RADIATION DOSE REDUCTION: This exam was performed according to the departmental dose-optimization program which includes automated exposure control, adjustment of the mA and/or kV according to patient size and/or use of iterative reconstruction technique. CONTRAST:  OMNIPAQUE  IOHEXOL  350 MG/ML SOLN COMPARISON:  12/05/2021 FINDINGS: CT CHEST ANGIOGRAM FINDINGS Cardiovascular: Satisfactory opacification of the pulmonary arteries to the segmental level. No evidence of pulmonary embolism. Cardiomegaly. Left and right coronary artery calcifications. No pericardial effusion. Enlargement of the tubular ascending thoracic aorta measuring up to 4.6 x 4.5 cm. Severe aortic atherosclerosis. Mediastinum/Nodes: No enlarged mediastinal, hilar, or axillary lymph nodes. Thyroid  gland, trachea, and esophagus demonstrate no significant findings. Lungs/Pleura: Moderate bilateral pleural effusions and associated atelectasis or consolidation. Extensive heterogeneous and ground-glass airspace disease throughout the right upper lobe (series 7, image 32). Diffuse interlobular septal thickening throughout both lungs. Musculoskeletal: No chest wall abnormality. No acute osseous findings. Review of the MIP images confirms the above findings. CT ABDOMEN PELVIS FINDINGS Hepatobiliary: No solid liver abnormality is seen. No gallstones, gallbladder wall thickening, or biliary dilatation. Pancreas: Unremarkable. No pancreatic ductal dilatation or surrounding inflammatory changes. Spleen: Normal in size without significant abnormality. Adrenals/Urinary Tract: Adrenal glands are unremarkable. Severely atrophic cystic kidneys, consistent with chronic renal disease and dialysis. Enhancing mass arising from the inferior pole of the right kidney measuring  2.5 x 2.4 cm (series 12, image 23). Appearance of the posterior midportion of the left kidney is suspicious for a second more irregular mass measuring 2.4 x 1.7 cm (series 12, image 22). Bladder is unremarkable. Stomach/Bowel: Stomach is within normal limits. Appendix not clearly visualized. No evidence of bowel wall thickening, distention, or inflammatory changes. Vascular/Lymphatic: Severe aortic atherosclerosis. Large-bore right femoral approach dialysis catheter. No enlarged abdominal or pelvic lymph nodes. Reproductive: Hysterectomy. Other: No abdominal wall hernia or abnormality. Trace free fluid in the low pelvis. Musculoskeletal: No acute or significant osseous findings. IMPRESSION: 1. Negative examination for pulmonary embolism. 2. Moderate bilateral pleural effusions and associated atelectasis or consolidation. Diffuse interlobular septal thickening throughout both lungs, findings consistent with pulmonary edema. 3. Extensive heterogeneous and ground-glass airspace disease throughout the right upper lobe, consistent with superimposed infection or aspiration. 4. Cardiomegaly and coronary artery disease. 5. Enlargement of the tubular ascending thoracic aorta measuring up to 4.6 x 4.5 cm. Ascending thoracic aortic aneurysm. Recommend semi-annual imaging followup by CTA or MRA and referral to cardiothoracic surgery if not already obtained and if clinically appropriate. This recommendation follows 2010 ACCF/AHA/AATS/ACR/ASA/SCA/SCAI/SIR/STS/SVM Guidelines for the Diagnosis and Management of Patients With Thoracic Aortic Disease. Circulation. 2010; 121: [id number]. Aortic aneurysm NOS (ICD10-I71.9) 6. Severely atrophic cystic kidneys, consistent with chronic renal disease and dialysis. 7. Enhancing mass arising from the inferior pole of the right kidney measuring 2.5 x 2.4 cm, consistent with renal cell carcinoma. Appearance of the posterior midportion of the left kidney is suspicious for a second more irregular  mass measuring 2.4 x 1.7 cm. Recommend multiphasic contrast enhanced CT or MRI on a nonemergent, outpatient basis for more confident assessment. Aortic Atherosclerosis (ICD10-I70.0). Electronically Signed   By: Marolyn JONETTA Marlyce CHRISTELLA.D.  On: 07/13/2023 08:47   DG Chest Portable 1 View Result Date: 07/13/2023 CLINICAL DATA:  Hypoxia.  Abdominal pain and chest pain. EXAM: PORTABLE CHEST 1 VIEW COMPARISON:  11/30/2021 FINDINGS: Stable cardiomediastinal contours. Aortic atherosclerosis. Small bilateral pleural effusions. Diffuse increase interstitial markings. No consolidative change. No signs of pneumothorax. No acute osseous findings. IMPRESSION: Small bilateral pleural effusions and diffuse increase interstitial markings compatible with pulmonary edema. Aortic Atherosclerosis (ICD10-I70.0). Electronically Signed   By: Waddell Calk M.D.   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  She denies missing any dialysis sessions - BNP 1279 and imaging with CHF - nephrology consulted and she underwent HD with improvement - today she appears euvolemic. She is off O2 - echo this admission showed LVEF 35-40%, mod MR - volume management per HD - continue Coreg  12.5mg  BID, Entresto  24-26mg BID  Chest pain  Elevated troponin - HS trop minimally elevated in the setting of volume overload - echo with reduced LVEF 35-40% from 50-55% by echo in 2023 - cath in 2022 showed non obstructive CAD.  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

## 2023-07-15 NOTE — Care Management Important Message (Signed)
 Important Message  Patient Details  Name: Teresa Cook MRN: [id number] Date of Birth: [birth year]   Important Message Given:  Yes - Medicare IM     [name] 07/15/2023, 12:15 PM

## 2023-07-15 NOTE — Progress Notes (Addendum)
 Progress Note    Teresa Cook  FMW:[id number] DOB: 05-14-1955  DOA: 07/13/2023 PCP: [facility]      Brief Narrative:    Medical records reviewed and are as summarized below:  Teresa Cook is a 68 y.o. female with medical history significant for ESRD on HD, anemia secondary to ESRD, Anxiety, CHF, Depression, DVT right thigh and left leg, dysrhythmia, hypertension, gout, parathyroid  disease, sickle cell trait.  She presented to [facility] ED on 07/13/2023 with complaints of chest pressure and shortness of breath.  He also complains of difficulty swallowing all consistencies of food.  CTA chest and CT abdomen pelvis report as follows:  IMPRESSION: 1. Negative examination for pulmonary embolism. 2. Moderate bilateral pleural effusions and associated atelectasis or consolidation. Diffuse interlobular septal thickening throughout both lungs, findings consistent with pulmonary edema. 3. Extensive heterogeneous and ground-glass airspace disease throughout the right upper lobe, consistent with superimposed infection or aspiration. 4. Cardiomegaly and coronary artery disease. 5. Enlargement of the tubular ascending thoracic aorta measuring up to 4.6 x 4.5 cm. Ascending thoracic aortic aneurysm. Recommend semi-annual imaging followup by CTA or MRA and referral to cardiothoracic surgery if not already obtained and if clinically appropriate. This recommendation follows 2010 ACCF/AHA/AATS/ACR/ASA/SCA/SCAI/SIR/STS/SVM Guidelines for the Diagnosis and Management of Patients With Thoracic Aortic Disease. Circulation. 2010; 121: Z733-z630. Aortic aneurysm NOS (ICD10-I71.9) 6. Severely atrophic cystic kidneys, consistent with chronic renal disease and dialysis. 7. Enhancing mass arising from the inferior pole of the right kidney measuring 2.5 x 2.4 cm, consistent with renal cell carcinoma. Appearance of the posterior midportion of the left kidney is suspicious for a  second more irregular mass measuring 2.4 x 1.7 cm. Recommend multiphasic contrast enhanced CT or MRI on a nonemergent, outpatient basis for more confident assessment.   Aortic Atherosclerosis (ICD10-I70.0).        Assessment/Plan:   Principal Problem:   Acute respiratory failure with hypoxia (HCC) Active Problems:   Acute on chronic combined systolic and diastolic CHF (congestive heart failure) (HCC)   Hypertensive emergency   ESRD on dialysis (HCC)   Parathyroid  disease (HCC)   Hypercalcemia   Depression with anxiety   Acute pulmonary edema (HCC)   Chest pain   Dysphagia    Body mass index is 19.7 kg/m.   Acute on chronic systolic and diastolic CHF, moderate bilateral pleural effusions and associated atelectasis: Fluid management with hemodialysis.  Monitor BMP, daily weight. BNP 1,279 Repeat 2D echo on 07/15/2023 showed EF estimated at 35 to 40%, grade 2 diastolic dysfunction, moderately dilated left atrium, moderate MR. Consulted [facility] cardiologist, Dr. Mady, to assist with management.  Last 2D echo in October 2023 showed preserved EF, grade 1 diastolic dysfunction. 2D echo in March 2019 showed EF estimated at 35% grade 1 diastolic dysfunction    Hypertensive emergency: BP has improved.  Continue antihypertensives (carvedilol , Entresto ).     ESRD on hemodialysis parathyroid  disease, hypercalcemia: Follow-up with nephrologist for hemodialysis. She is on MWF schedule.  Acute on chronic anemia: Hb dropped from 10.3 to 7.4. She's asymptomatic and no evidence of bleeding thus far. Monitor H&H. Decision made to transfuse prbcs during hemodialysis tomorrow if Hb remains low. Eliquis  held. Discussed with Dr. Mady, cardiologist. Patient agreeable to blood transfusion.   Chest tightness: Improved.  Likely due to acute lung disease. Atypical lower chest pain/epigastric pain: Continue Protonix  Analgesics as needed for pain. Elevated troponins: 40, 48.  This is likely due to  demand ischemia   Acute hypoxic  respiratory failure: Resolved.  She was initially treated with BiPAP and weaned to 4 L/min oxygen.  She is now tolerating room air.    Probable community-acquired pneumonia: Continue IV ceftriaxone  and azithromycin .  Will plan to switch to oral antibiotics tomorrow to complete 5 days of treatment.  No growth on blood cultures thus far   Dysphagia, epigastric pain: She complained of difficulty swallowing solid foods and sometimes pills get stuck in her throat.  Barium swallow was normal.  Recommended outpatient follow-up with gastroenterologist.  She requested inpatient evaluation.  Consulted Dr. Jinny, gastroenterologist, to assist with management   Bilateral renal masses concerning for renal cell carcinoma, he has been evaluated by urologist.  Outpatient MRI recommended for further evaluation.  She has also been seen by the oncologist, Dr. Jacobo, who will follow the patient in the outpatient setting.   Ascending thoracic aortic aneurysm 4.6 x 4.5 cm: Recommend semi-annual imaging followup by CTA or MRA and referral to cardiothoracic surgery as an outpatient    Comorbidities include history of DVT and paroxysmal atrial fibrillation on amiodarone  and Eliquis , depression, parathyroid  disease, history of left breast cancer s/p mastectomy in 2018      Diet Order             Diet heart healthy/carb modified Room service appropriate? Yes; Fluid consistency: Thin  Diet effective now                            Consultants: Paramedic  Procedures: None    Medications:    amiodarone   200 mg Oral BID   apixaban   2.5 mg Oral BID   carvedilol   12.5 mg Oral BID   Chlorhexidine  Gluconate Cloth  6 each Topical Q0600   insulin  aspart  0-6 Units Subcutaneous TID WC   multivitamin  1 tablet Oral Daily   pantoprazole   40 mg Oral Daily   sacubitril -valsartan   1 tablet Oral BID   sertraline   150  mg Oral Daily   traZODone   50 mg Oral QHS   zolpidem   5 mg Oral QHS   Continuous Infusions:  azithromycin  500 mg (07/15/23 9388)   ceFEPime  (MAXIPIME ) IV 1 g (07/15/23 1243)     Anti-infectives (From admission, onward)    Start     Dose/Rate Route Frequency Ordered Stop   07/14/23 1800  vancomycin  (VANCOREADY) IVPB 500 mg/100 mL  Status:  Discontinued        500 mg 100 mL/hr over 60 Minutes Intravenous Every M-W-F 07/13/23 1700 07/14/23 1249   07/14/23 1000  ceFEPIme  (MAXIPIME ) 1 g in sodium chloride  0.9 % 100 mL IVPB        1 g 200 mL/hr over 30 Minutes Intravenous Every 24 hours 07/13/23 1637     07/14/23 0700  azithromycin  (ZITHROMAX ) 500 mg in sodium chloride  0.9 % 250 mL IVPB        500 mg 250 mL/hr over 60 Minutes Intravenous Every 24 hours 07/13/23 1637     07/13/23 1700  vancomycin  (VANCOREADY) IVPB 1250 mg/250 mL        1,250 mg 166.7 mL/hr over 90 Minutes Intravenous  Once 07/13/23 1657 07/13/23 1929   07/13/23 1645  vancomycin  (VANCOCIN ) IVPB 1000 mg/200 mL premix  Status:  Discontinued        1,000 mg 200 mL/hr over 60 Minutes Intravenous Every 48 hours 07/13/23 1637 07/13/23 1657   07/13/23 0915  vancomycin  (VANCOCIN ) IVPB 1000 mg/200  mL premix        1,000 mg 200 mL/hr over 60 Minutes Intravenous  Once 07/13/23 0904 07/13/23 1226   07/13/23 0915  ceFEPIme  (MAXIPIME ) 2 g in sodium chloride  0.9 % 100 mL IVPB        2 g 200 mL/hr over 30 Minutes Intravenous  Once 07/13/23 0904 07/13/23 1016   07/13/23 0915  azithromycin  (ZITHROMAX ) 500 mg in sodium chloride  0.9 % 250 mL IVPB        500 mg 250 mL/hr over 60 Minutes Intravenous  Once 07/13/23 0904 07/13/23 1125              Family Communication/Anticipated D/C date and plan/Code Status   DVT prophylaxis: apixaban  (ELIQUIS ) tablet 2.5 mg Start: 07/13/23 2200 apixaban  (ELIQUIS ) tablet 2.5 mg     Code Status: Limited: Do not attempt resuscitation (DNR) -DNR-LIMITED -Do Not Intubate/DNI   Family  Communication: None Disposition Plan: Plan to discharge home   Status is: Inpatient Remains inpatient appropriate because: Acute CHF respiratory failure       Subjective:   Interval events noted.  She complains of lower chest pain/epigastric pain and difficulty swallowing.  She said she has trouble swallowing solid foods and sometimes she feels as though pills get stuck in her throat.  No shortness of breath.  Objective:    Vitals:   07/15/23 0500 07/15/23 0809 07/15/23 1157 07/15/23 1222  BP:  112/75 134/81 134/81  Pulse:  65 77 77  Resp:  18 18   Temp:  98.3 F (36.8 C) 98.1 F (36.7 C)   TempSrc:      SpO2:  100% 99%   Weight: 53.7 kg     Height:       No data found.   Intake/Output Summary (Last 24 hours) at 07/15/2023 1355 Last data filed at 07/15/2023 0900 Gross per 24 hour  Intake 480 ml  Output 1500 ml  Net -1020 ml   Filed Weights   07/14/23 1334 07/14/23 1652 07/15/23 0500  Weight: 54.2 kg 52.5 kg 53.7 kg    Exam:  GEN: NAD SKIN: Warm and dry EYES: No pallor no icterus ENT: MMM CV: RRR PULM: Bibasilar rales.  No wheezing ABD: soft, ND, NT, +BS CNS: AAO x 3, non focal EXT: No edema or tenderness     Data Reviewed:   I have personally reviewed following labs and imaging studies:  Labs: Labs show the following:   Basic Metabolic Panel: Recent Labs  Lab 07/13/23 0645  NA 136  K 3.7  CL 99  CO2 21*  GLUCOSE 103*  BUN 29*  CREATININE 7.62*  CALCIUM  10.6*   GFR Estimated Creatinine Clearance: 5.9 mL/min (A) (by C-G formula based on SCr of 7.62 mg/dL (H)). Liver Function Tests: Recent Labs  Lab 07/13/23 0645  AST 26  ALT 25  ALKPHOS 76  BILITOT 0.9  PROT 7.1  ALBUMIN  3.5   Recent Labs  Lab 07/13/23 0645  LIPASE 25   No results for input(s): AMMONIA in the last 168 hours. Coagulation profile No results for input(s): INR, PROTIME in the last 168 hours.  CBC: Recent Labs  Lab 07/13/23 0645  WBC 11.7*   NEUTROABS 9.1*  HGB 10.3*  HCT 30.4*  MCV 85.2  PLT 148*   Cardiac Enzymes: No results for input(s): CKTOTAL, CKMB, CKMBINDEX, TROPONINI in the last 168 hours. BNP (last 3 results) No results for input(s): PROBNP in the last 8760 hours. CBG: Recent Labs  Lab 07/14/23  1132 07/14/23 1748 07/14/23 2128 07/15/23 0810 07/15/23 1157  GLUCAP 123* 80 135* 107* 112*   D-Dimer: No results for input(s): DDIMER in the last 72 hours. Hgb A1c: Recent Labs    07/13/23 1240  HGBA1C 4.7*   Lipid Profile: No results for input(s): CHOL, HDL, LDLCALC, TRIG, CHOLHDL, LDLDIRECT in the last 72 hours. Thyroid  function studies: No results for input(s): TSH, T4TOTAL, T3FREE, THYROIDAB in the last 72 hours.  Invalid input(s): FREET3 Anemia work up: No results for input(s): VITAMINB12, FOLATE, FERRITIN, TIBC, IRON, RETICCTPCT in the last 72 hours. Sepsis Labs: Recent Labs  Lab 07/13/23 0645 07/13/23 0940  WBC 11.7*  --   LATICACIDVEN  --  1.2    Microbiology Recent Results (from the past 240 hours)  Resp panel by RT-PCR (RSV, Flu A&B, Covid) Anterior Nasal Swab     Status: None   Collection Time: 07/13/23  6:45 AM   Specimen: Anterior Nasal Swab  Result Value Ref Range Status   SARS Coronavirus 2 by RT PCR NEGATIVE NEGATIVE Final    Comment: (NOTE) SARS-CoV-2 target nucleic acids are NOT DETECTED.  The SARS-CoV-2 RNA is generally detectable in upper respiratory specimens during the acute phase of infection. The lowest concentration of SARS-CoV-2 viral copies this assay can detect is 138 copies/mL. A negative result does not preclude SARS-Cov-2 infection and should not be used as the sole basis for treatment or other patient management decisions. A negative result may occur with  improper specimen collection/handling, submission of specimen other than nasopharyngeal swab, presence of viral mutation(s) within the areas targeted by this  assay, and inadequate number of viral copies(<138 copies/mL). A negative result must be combined with clinical observations, patient history, and epidemiological information. The expected result is Negative.  Fact Sheet for Patients:  BloggerCourse.com  Fact Sheet for Healthcare Providers:  SeriousBroker.it  This test is no t yet approved or cleared by the [place]  FDA and  has been authorized for detection and/or diagnosis of SARS-CoV-2 by FDA under an Emergency Use Authorization (EUA). This EUA will remain  in effect (meaning this test can be used) for the duration of the COVID-19 declaration under Section 564(b)(1) of the Act, 21 U.S.C.section 360bbb-3(b)(1), unless the authorization is terminated  or revoked sooner.       Influenza A by PCR NEGATIVE NEGATIVE Final   Influenza B by PCR NEGATIVE NEGATIVE Final    Comment: (NOTE) The Xpert Xpress SARS-CoV-2/FLU/RSV plus assay is intended as an aid in the diagnosis of influenza from Nasopharyngeal swab specimens and should not be used as a sole basis for treatment. Nasal washings and aspirates are unacceptable for Xpert Xpress SARS-CoV-2/FLU/RSV testing.  Fact Sheet for Patients: BloggerCourse.com  Fact Sheet for Healthcare Providers: SeriousBroker.it  This test is not yet approved or cleared by the [place]  FDA and has been authorized for detection and/or diagnosis of SARS-CoV-2 by FDA under an Emergency Use Authorization (EUA). This EUA will remain in effect (meaning this test can be used) for the duration of the COVID-19 declaration under Section 564(b)(1) of the Act, 21 U.S.C. section 360bbb-3(b)(1), unless the authorization is terminated or revoked.     Resp Syncytial Virus by PCR NEGATIVE NEGATIVE Final    Comment: (NOTE) Fact Sheet for Patients: BloggerCourse.com  Fact Sheet for  Healthcare Providers: SeriousBroker.it  This test is not yet approved or cleared by the [place]  FDA and has been authorized for detection and/or diagnosis of SARS-CoV-2 by FDA under an Emergency Use Authorization (  EUA). This EUA will remain in effect (meaning this test can be used) for the duration of the COVID-19 declaration under Section 564(b)(1) of the Act, 21 U.S.C. section 360bbb-3(b)(1), unless the authorization is terminated or revoked.  Performed at [facility], [address] [place], [place] [address]   Blood culture (routine x 2)     Status: None (Preliminary result)   Collection Time: 07/13/23  9:30 AM   Specimen: BLOOD  Result Value Ref Range Status   Specimen Description BLOOD BLOOD LEFT FOREARM  Final   Special Requests   Final    BOTTLES DRAWN AEROBIC AND ANAEROBIC Blood Culture results may not be optimal due to an inadequate volume of blood received in culture bottles   Culture   Final    NO GROWTH 2 DAYS Performed at [facility], [address] [place], [place] [address]    Report Status PENDING  Incomplete  Blood culture (routine x 2)     Status: None (Preliminary result)   Collection Time: 07/13/23  9:40 AM   Specimen: BLOOD  Result Value Ref Range Status   Specimen Description BLOOD BLOOD RIGHT HAND  Final   Special Requests   Final    BOTTLES DRAWN AEROBIC AND ANAEROBIC Blood Culture results may not be optimal due to an inadequate volume of blood received in culture bottles   Culture   Final    NO GROWTH 2 DAYS Performed at [facility], [address], [place], [place] [address]    Report Status PENDING  Incomplete  MRSA Next Gen by PCR, Nasal     Status: None   Collection Time: 07/14/23  5:30 AM   Specimen: Nasal Mucosa; Nasal Swab  Result Value Ref Range Status   MRSA by PCR Next Gen NOT DETECTED NOT DETECTED Final    Comment: (NOTE) The GeneXpert MRSA Assay (FDA approved for NASAL  specimens only), is one component of a comprehensive MRSA colonization surveillance program. It is not intended to diagnose MRSA infection nor to guide or monitor treatment for MRSA infections. Test performance is not FDA approved in patients less than 52 years old. Performed at [facility], [address] [place], [place] [address]     Procedures and diagnostic studies:  ECHOCARDIOGRAM COMPLETE Result Date: 07/15/2023    ECHOCARDIOGRAM REPORT   Patient Cook:   Teresa Cook Date of Exam: 07/15/2023 Medical Rec #:  [id number]       Height:       65.0 in Accession #:    [id number]      Weight:       118.4 lb Date of Birth:  10-Aug-1955       BSA:          1.583 m Patient Age:    68 years        BP:           112/63 mmHg Patient Gender: F               HR:           68 bpm. Exam Location:  [facility] Procedure: 2D Echo, Cardiac Doppler and Color Doppler (Both Spectral and Color            Flow Doppler were utilized during procedure). Indications:     Dyspnea R06.00  History:         Patient has prior history of Echocardiogram examinations, most  recent 11/07/2021. CHF; Risk Factors:Hypertension.  Sonographer:     Christopher Furnace Referring Phys:  [id number] AVA SWAYZE Diagnosing Phys: Deatrice Cage MD IMPRESSIONS  1. Left ventricular ejection fraction, by estimation, is 35 to 40%. The left ventricle has moderately decreased function. The left ventricle demonstrates global hypokinesis. There is mild left ventricular hypertrophy. Left ventricular diastolic parameters are consistent with Grade II diastolic dysfunction (pseudonormalization).  2. Right ventricular systolic function is normal. The right ventricular size is normal. Tricuspid regurgitation signal is inadequate for assessing PA pressure.  3. Left atrial size was moderately dilated.  4. 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 No evidence of mitral stenosis. Moderate mitral annular calcification.  5. The aortic  valve is calcified. Aortic valve regurgitation is trivial. Aortic valve sclerosis/calcification is present, without any evidence of aortic stenosis.  6. The inferior vena cava is normal in size with greater than 50% respiratory variability, suggesting right atrial pressure of 3 mmHg. FINDINGS  Left Ventricle: Left ventricular ejection fraction, by estimation, is 35 to 40%. The left ventricle has moderately decreased function. The left ventricle demonstrates global hypokinesis. The left ventricular internal cavity size was normal in size. There is mild left ventricular hypertrophy. Left ventricular diastolic parameters are consistent with Grade II diastolic dysfunction (pseudonormalization). Right Ventricle: The right ventricular size is normal. No increase in right ventricular wall thickness. Right ventricular systolic function is normal. Tricuspid regurgitation signal is inadequate for assessing PA pressure. The tricuspid regurgitant velocity is 1.82 m/s, and with an assumed right atrial pressure of 3 mmHg, the estimated right ventricular systolic pressure is 16.2 mmHg. Left Atrium: Left atrial size was moderately dilated. Right Atrium: Right atrial size was normal in size. Pericardium: There is no evidence of pericardial effusion. Mitral Valve: The mitral valve is myxomatous. There is moderate thickening of the mitral valve leaflet(s). Moderate mitral annular calcification. Moderate mitral valve regurgitation. No evidence of mitral valve stenosis. MV peak gradient, 6.9 mmHg. The mean mitral valve gradient is 3.0 mmHg. Tricuspid Valve: The tricuspid valve is normal in structure. Tricuspid valve regurgitation is trivial. No evidence of tricuspid stenosis. Aortic Valve: The aortic valve is calcified. Aortic valve regurgitation is trivial. Aortic valve sclerosis/calcification is present, without any evidence of aortic stenosis. Aortic valve mean gradient measures 4.0 mmHg. Aortic valve peak gradient measures 5.9 mmHg.  Aortic valve area, by VTI measures 2.04 cm. Pulmonic Valve: The pulmonic valve was normal in structure. Pulmonic valve regurgitation is not visualized. No evidence of pulmonic stenosis. Aorta: The aortic root is normal in size and structure. Venous: The inferior vena cava is normal in size with greater than 50% respiratory variability, suggesting right atrial pressure of 3 mmHg. IAS/Shunts: No atrial level shunt detected by color flow Doppler.  LEFT VENTRICLE PLAX 2D LVIDd:         3.65 cm   Diastology LVIDs:         2.42 cm   LV e' medial:    3.87 cm/s LV PW:         1.31 cm   LV E/e' medial:  28.9 LV IVS:        1.34 cm   LV e' lateral:   4.06 cm/s LVOT diam:     2.00 cm   LV E/e' lateral: 27.6 LV SV:         49 LV SV Index:   31 LVOT Area:     3.14 cm  RIGHT VENTRICLE RV Basal diam:  3.42 cm RV  Mid diam:    2.95 cm LEFT ATRIUM            Index         RIGHT ATRIUM           Index LA diam:      3.20 cm  2.02 cm/m    RA Area:     14.80 cm LA Vol (A2C): 170.0 ml 107.39 ml/m  RA Volume:   36.00 ml  22.74 ml/m LA Vol (A4C): 69.0 ml  43.59 ml/m  AORTIC VALVE AV Area (Vmax):    1.99 cm AV Area (Vmean):   1.93 cm AV Area (VTI):     2.04 cm AV Vmax:           121.47 cm/s AV Vmean:          86.600 cm/s AV VTI:            0.242 m AV Peak Grad:      5.9 mmHg AV Mean Grad:      4.0 mmHg LVOT Vmax:         77.00 cm/s LVOT Vmean:        53.200 cm/s LVOT VTI:          0.157 m LVOT/AV VTI ratio: 0.65  AORTA Ao Root diam: 3.70 cm MITRAL VALVE                TRICUSPID VALVE MV Area (PHT): 3.85 cm     TR Peak grad:   13.2 mmHg MV Area VTI:   1.55 cm     TR Vmax:        182.00 cm/s MV Peak grad:  6.9 mmHg MV Mean grad:  3.0 mmHg     SHUNTS MV Vmax:       1.31 m/s     Systemic VTI:  0.16 m MV Vmean:      79.6 cm/s    Systemic Diam: 2.00 cm MV Decel Time: 197 msec MV E velocity: 112.00 cm/s MV A velocity: 63.50 cm/s MV E/A ratio:  1.76 Deatrice Cage MD Electronically signed by Deatrice Cage MD Signature Date/Time:  07/15/2023/1:22:57 PM    Final    DG ESOPHAGUS W SINGLE CM (SOL OR THIN BA) Result Date: 07/14/2023 CLINICAL DATA:  [id number] Dysphagia 3571 68 year old female. Endorsing worsening dysphagia with solids. Request is for esophagram for further evaluation EXAM: ESOPHAGUS/BARIUM SWALLOW/TABLET STUDY TECHNIQUE: Single contrast examination was performed using thin liquid barium. This exam was performed by Delon Beagle NP, and was supervised and interpreted by Dr. Thom Hall. FLUOROSCOPY: Radiation Exposure Index and estimated peak skin dose (PSD); Reference air kerma (RAK), 14.2 mGy. Kerma-area product (KAP), 292.1 uGy*m. COMPARISON:  Chest XR, 07/13/2023.  CTA CAP, 11/05/2021. FINDINGS: Swallowing: Appears normal. No vestibular penetration or aspiration seen. Pharynx: Unremarkable. Esophagus: Normal appearance. Esophageal motility: Mild esophageal dysmotility, with tertiary contractions. Otherwise, within normal limits. Hiatal Hernia: None. Gastroesophageal reflux: None visualized. Ingested 13mm barium tablet: Passed normally Other: None. IMPRESSION: No masses, lesions or significant dysmotility. Electronically Signed   By: Thom Hall M.D.   On: 07/14/2023 10:55               LOS: 2 days   Teresa Cook  [facility]   Pager on [email or web address]. If 7PM-7AM, please contact night-coverage at [email or web address]     07/15/2023, 1:55 PM

## 2023-07-15 NOTE — Consult Note (Signed)
 Teresa Copping, MD [facility]  [address], [address] [place], [place] [address] Phone: [phone] Fax : [phone]  Consultation  Referring Provider:     Dr. Jens Primary Care Physician:  [facility] Primary Gastroenterologist: [facility] Reason for Consultation:     Dysphagia  Date of Admission:  07/13/2023 Date of Consultation:  07/15/2023         HPI:   Teresa Cook is a 68 y.o. female who has a significant history for end-stage renal disease on hemodialysis.  The patient also has chronic anemia due to end-stage renal disease.  The patient reports that she is lost a significant amount of weight over the last 2 years and has had some problems with swallowing.  She states it only happens with some foods but her other issue is that she reports that she has frequent vomiting which depends on what she eats.  She states that she has dry heaving in the morning very frequently.  The vomiting is typically done anywhere from 1 to 4 hours after she eats and is the food that she has eaten previously.  The patient reports that she has some issues with swallowing big pills.  The patient had a barium swallow that showed:  IMPRESSION: No masses, lesions or significant dysmotility.  Although the body of the text did report mild esophageal dysmotility with tertiary contractions.  She reports that she was admitted with fluid on the lungs and has some pain under her right armpit.  The patient is also on daily Eliquis .  Past Medical History:  Diagnosis Date   Anemia    Anxiety    Breast cancer (HCC) 01/2016   bilateral   CHF (congestive heart failure) (HCC)    Chronic kidney disease    Depression    Dialysis patient [facility])    DVT (deep venous thrombosis) (HCC)    left leg   DVT (deep venous thrombosis) (HCC) 1985   right thigh   Dysrhythmia    Gout    Headache    HTN (hypertension)    Hypertension    Nausea vomiting and diarrhea    Parathyroid  abnormality (HCC)     Parathyroid  disease (HCC)    Pneumonia 12/2015   Psoriasis    Renal insufficiency    Sickle cell trait (HCC)    traits    Past Surgical History:  Procedure Laterality Date   ABDOMINAL HYSTERECTOMY  1980   APPENDECTOMY     BREAST BIOPSY Left 10/28/2013   benign   BREAST EXCISIONAL BIOPSY Left 2002   benign   CORONARY ANGIOGRAPHY N/A 04/04/2020   Procedure: CORONARY ANGIOGRAPHY;  Surgeon: Darron Deatrice LABOR, MD;  Location: [facility] INVASIVE CV LAB;  Service: Cardiovascular;  Laterality: N/A;   CYSTOSCOPY N/A 12/06/2021   Procedure: CYSTOSCOPY;  Surgeon: Twylla Glendia BROCKS, MD;  Location: [facility] ORS;  Service: Urology;  Laterality: N/A;   DIALYSIS/PERMA CATHETER INSERTION N/A 02/27/2022   Procedure: DIALYSIS/PERMA CATHETER INSERTION;  Surgeon: Marea Selinda RAMAN, MD;  Location: [facility] INVASIVE CV LAB;  Service: Cardiovascular;  Laterality: N/A;   INSERTION OF DIALYSIS CATHETER  2014   LIPOMA EXCISION N/A 01/23/2016   Procedure: EXCISION LIPOMA;  Surgeon: Dorothyann LITTIE Husk, MD;  Location: [facility] ORS;  Service: General;  Laterality: N/A;   MASTECTOMY W/ SENTINEL NODE BIOPSY Bilateral 01/23/2016   Procedure: bilateral MASTECTOMY WITH  bilateral SENTINEL LYMPH NODE BIOPSY possible left axillary node dissection forehead lipoma removal;  Surgeon: Dorothyann LITTIE Husk, MD;  Location:  [facility] ORS;  Service: General;  Laterality: Bilateral;   PARTIAL HYSTERECTOMY     PERIPHERAL VASCULAR CATHETERIZATION N/A 12/25/2015   Procedure: Dialysis/Perma Catheter Insertion;  Surgeon: Selinda GORMAN Gu, MD;  Location: [facility] INVASIVE CV LAB;  Service: Cardiovascular;  Laterality: N/A;   PERIPHERAL VASCULAR CATHETERIZATION Left 01/22/2016   Procedure: Dialysis/Perma Catheter Insertion;  Surgeon: Selinda GORMAN Gu, MD;  Location: [facility] INVASIVE CV LAB;  Service: Cardiovascular;  Laterality: Left;   PERIPHERAL VASCULAR CATHETERIZATION N/A 01/26/2016   Procedure: Dialysis/Perma Catheter Insertion;  Surgeon: Cordella KANDICE Shawl, MD;  Location: [facility]  INVASIVE CV LAB;  Service: Cardiovascular;  Laterality: N/A;   PORT-A-CATH REMOVAL N/A 12/20/2015   Procedure: REMOVAL PORT-A-CATH;  Surgeon: Dorothyann LITTIE Husk, MD;  Location: [facility] ORS;  Service: General;  Laterality: N/A;  left     PORTACATH PLACEMENT Left 08/21/2015   Procedure: INSERTION PORT-A-CATH;  Surgeon: Dorothyann LITTIE Husk, MD;  Location: [facility] ORS;  Service: General;  Laterality: Left;   REMOVAL OF A DIALYSIS CATHETER  2017   RENAL ANGIOGRAPHY Right 11/05/2021   Procedure: RENAL ANGIOGRAPHY;  Surgeon: Gu Selinda GORMAN, MD;  Location: [facility] INVASIVE CV LAB;  Service: Cardiovascular;  Laterality: Right;  with embolization   RIGHT HEART CATH N/A 04/04/2020   Procedure: RIGHT HEART CATH;  Surgeon: Darron Deatrice LABOR, MD;  Location: [facility] INVASIVE CV LAB;  Service: Cardiovascular;  Laterality: N/A;   URETEROSCOPY Bilateral 12/06/2021   Procedure: URETEROSCOPY;  Surgeon: Twylla Glendia BROCKS, MD;  Location: [facility] ORS;  Service: Urology;  Laterality: Bilateral;    Prior to Admission medications   Medication Sig Start Date End Date Taking? Authorizing Provider  amiodarone  (PACERONE ) 200 MG tablet Take 1 tablet (200 mg total) by mouth 2 (two) times daily. 11/22/21  Yes Patel, Sona, MD  apixaban  (ELIQUIS ) 2.5 MG TABS tablet Take 1 tablet (2.5 mg total) by mouth 2 (two) times daily. 12/06/21  Yes Alexander, Natalie, DO  b complex-vitamin c-folic acid  (NEPHRO-VITE) 0.8 MG TABS tablet Take 1 tablet by mouth daily.   Yes [provider]  carvedilol  (COREG ) 12.5 MG tablet Take 12.5 mg by mouth 2 (two) times daily.   Yes [provider]  midodrine (PROAMATINE) 10 MG tablet Take 10 mg by mouth every Monday, Wednesday, and Friday with hemodialysis.   Yes [provider]  pantoprazole  (PROTONIX ) 40 MG tablet Take 1 tablet (40 mg total) by mouth 2 (two) times daily. Patient taking differently: Take 40 mg by mouth daily. 07/30/21  Yes Wieting, Richard, MD  sacubitril -valsartan  (ENTRESTO )  24-26 MG Take 1 tablet by mouth 2 (two) times daily. 02/15/21  Yes Agbor-Etang, Redell, MD  sertraline  (ZOLOFT ) 100 MG tablet Take 150 mg by mouth daily.   Yes [provider]  traZODone  (DESYREL ) 50 MG tablet Take 50 mg by mouth at bedtime. 03/01/21  Yes [provider]  zolpidem  (AMBIEN ) 10 MG tablet Take 0.5 tablets (5 mg total) by mouth at bedtime. Patient taking differently: Take 10 mg by mouth at bedtime. 07/30/21  Yes Josette Ade, MD    Family History  Problem Relation Age of Onset   Stroke Mother    CVA Mother    Hypertension Mother    Hypertension Father    Hypertension Sister    Diabetes Sister    Cancer Sister 69       Breast   Stroke Brother    Hypertension Brother    Breast cancer Maternal Aunt 54     Social History   Tobacco Use  Smoking status: Never   Smokeless tobacco: Never  Vaping Use   Vaping status: Never Used  Substance Use Topics   Alcohol use: No    Alcohol/week: 0.0 standard drinks of alcohol   Drug use: No    Allergies as of 07/13/2023 - Review Complete 07/13/2023  Allergen Reaction Noted   Amlodipine  Palpitations 03/19/2011   Gabapentin  Other (See Comments) 01/16/2016   Hydrochlorothiazide  03/06/2011   Atorvastatin   10/16/2011   Lisinopril Cough 08/21/2011   Adhesive [tape] Itching 08/14/2015    Review of Systems:    All systems reviewed and negative except where noted in HPI.   Physical Exam:  Vital signs in last 24 hours: Temp:  [98.1 F (36.7 C)-99 F (37.2 C)] 98.1 F (36.7 C) (07/08 1157) Pulse Rate:  [62-88] 77 (07/08 1222) Resp:  [16-23] 18 (07/08 1157) BP: (100-144)/(63-85) 134/81 (07/08 1222) SpO2:  [95 %-100 %] 99 % (07/08 1157) Weight:  [52.5 kg-53.7 kg] 53.7 kg (07/08 0500) Last BM Date : 07/13/23 General:   Pleasant, cooperative in NAD Head:  Normocephalic and atraumatic. Eyes:   No icterus.   Conjunctiva pink. PERRLA. Ears:  Normal auditory acuity. Neck:  Supple; no masses or  thyroidomegaly Rectal:  Not performed. Neurologic:  Alert and oriented x3;  grossly normal neurologically. Skin:  Intact without significant lesions or rashes. Psych:  Alert and cooperative. Normal affect.  LAB RESULTS: Recent Labs    07/13/23 0645  WBC 11.7*  HGB 10.3*  HCT 30.4*  PLT 148*   BMET Recent Labs    07/13/23 0645  NA 136  K 3.7  CL 99  CO2 21*  GLUCOSE 103*  BUN 29*  CREATININE 7.62*  CALCIUM  10.6*   LFT Recent Labs    07/13/23 0645  PROT 7.1  ALBUMIN  3.5  AST 26  ALT 25  ALKPHOS 76  BILITOT 0.9   PT/INR No results for input(s): LABPROT, INR in the last 72 hours.  STUDIES: ECHOCARDIOGRAM COMPLETE Result Date: 07/15/2023    ECHOCARDIOGRAM REPORT   Patient Name:   Shakevia Blixt Date of Exam: 07/15/2023 Medical Rec #:  [id number]       Height:       65.0 in Accession #:    [id number]      Weight:       118.4 lb Date of Birth:  [birth year]       BSA:          1.583 m Patient Age:    68 years        BP:           112/63 mmHg Patient Gender: F               HR:           68 bpm. Exam Location:  [facility] Procedure: 2D Echo, Cardiac Doppler and Color Doppler (Both Spectral and Color            Flow Doppler were utilized during procedure). Indications:     Dyspnea R06.00  History:         Patient has prior history of Echocardiogram examinations, most                  recent 11/07/2021. CHF; Risk Factors:Hypertension.  Sonographer:     Christopher Furnace Referring Phys:  [id number] AVA SWAYZE Diagnosing Phys: Deatrice Cage MD IMPRESSIONS  1. Left ventricular ejection fraction, by estimation, is 35 to 40%. The left ventricle has moderately decreased function. The  left ventricle demonstrates global hypokinesis. There is mild left ventricular hypertrophy. Left ventricular diastolic parameters are consistent with Grade II diastolic dysfunction (pseudonormalization).  2. Right ventricular systolic function is normal. The right ventricular size is normal. Tricuspid regurgitation signal is  inadequate for assessing PA pressure.  3. Left atrial size was moderately dilated.  4. 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 No evidence of mitral stenosis. Moderate mitral annular calcification.  5. The aortic valve is calcified. Aortic valve regurgitation is trivial. Aortic valve sclerosis/calcification is present, without any evidence of aortic stenosis.  6. The inferior vena cava is normal in size with greater than 50% respiratory variability, suggesting right atrial pressure of 3 mmHg. FINDINGS  Left Ventricle: Left ventricular ejection fraction, by estimation, is 35 to 40%. The left ventricle has moderately decreased function. The left ventricle demonstrates global hypokinesis. The left ventricular internal cavity size was normal in size. There is mild left ventricular hypertrophy. Left ventricular diastolic parameters are consistent with Grade II diastolic dysfunction (pseudonormalization). Right Ventricle: The right ventricular size is normal. No increase in right ventricular wall thickness. Right ventricular systolic function is normal. Tricuspid regurgitation signal is inadequate for assessing PA pressure. The tricuspid regurgitant velocity is 1.82 m/s, and with an assumed right atrial pressure of 3 mmHg, the estimated right ventricular systolic pressure is 16.2 mmHg. Left Atrium: Left atrial size was moderately dilated. Right Atrium: Right atrial size was normal in size. Pericardium: There is no evidence of pericardial effusion. Mitral Valve: The mitral valve is myxomatous. There is moderate thickening of the mitral valve leaflet(s). Moderate mitral annular calcification. Moderate mitral valve regurgitation. No evidence of mitral valve stenosis. MV peak gradient, 6.9 mmHg. The mean mitral valve gradient is 3.0 mmHg. Tricuspid Valve: The tricuspid valve is normal in structure. Tricuspid valve regurgitation is trivial. No evidence of tricuspid stenosis. Aortic Valve: The  aortic valve is calcified. Aortic valve regurgitation is trivial. Aortic valve sclerosis/calcification is present, without any evidence of aortic stenosis. Aortic valve mean gradient measures 4.0 mmHg. Aortic valve peak gradient measures 5.9 mmHg. Aortic valve area, by VTI measures 2.04 cm. Pulmonic Valve: The pulmonic valve was normal in structure. Pulmonic valve regurgitation is not visualized. No evidence of pulmonic stenosis. Aorta: The aortic root is normal in size and structure. Venous: The inferior vena cava is normal in size with greater than 50% respiratory variability, suggesting right atrial pressure of 3 mmHg. IAS/Shunts: No atrial level shunt detected by color flow Doppler.  LEFT VENTRICLE PLAX 2D LVIDd:         3.65 cm   Diastology LVIDs:         2.42 cm   LV e' medial:    3.87 cm/s LV PW:         1.31 cm   LV E/e' medial:  28.9 LV IVS:        1.34 cm   LV e' lateral:   4.06 cm/s LVOT diam:     2.00 cm   LV E/e' lateral: 27.6 LV SV:         49 LV SV Index:   31 LVOT Area:     3.14 cm  RIGHT VENTRICLE RV Basal diam:  3.42 cm RV Mid diam:    2.95 cm LEFT ATRIUM            Index         RIGHT ATRIUM           Index LA diam:  3.20 cm  2.02 cm/m    RA Area:     14.80 cm LA Vol (A2C): 170.0 ml 107.39 ml/m  RA Volume:   36.00 ml  22.74 ml/m LA Vol (A4C): 69.0 ml  43.59 ml/m  AORTIC VALVE AV Area (Vmax):    1.99 cm AV Area (Vmean):   1.93 cm AV Area (VTI):     2.04 cm AV Vmax:           121.47 cm/s AV Vmean:          86.600 cm/s AV VTI:            0.242 m AV Peak Grad:      5.9 mmHg AV Mean Grad:      4.0 mmHg LVOT Vmax:         77.00 cm/s LVOT Vmean:        53.200 cm/s LVOT VTI:          0.157 m LVOT/AV VTI ratio: 0.65  AORTA Ao Root diam: 3.70 cm MITRAL VALVE                TRICUSPID VALVE MV Area (PHT): 3.85 cm     TR Peak grad:   13.2 mmHg MV Area VTI:   1.55 cm     TR Vmax:        182.00 cm/s MV Peak grad:  6.9 mmHg MV Mean grad:  3.0 mmHg     SHUNTS MV Vmax:       1.31 m/s     Systemic  VTI:  0.16 m MV Vmean:      79.6 cm/s    Systemic Diam: 2.00 cm MV Decel Time: 197 msec MV E velocity: 112.00 cm/s MV A velocity: 63.50 cm/s MV E/A ratio:  1.76 Deatrice Cage MD Electronically signed by Deatrice Cage MD Signature Date/Time: 07/15/2023/1:22:57 PM    Final    DG ESOPHAGUS W SINGLE CM (SOL OR THIN BA) Result Date: 07/14/2023 CLINICAL DATA:  [id number] Dysphagia 362 68 year old female. Endorsing worsening dysphagia with solids. Request is for esophagram for further evaluation EXAM: ESOPHAGUS/BARIUM SWALLOW/TABLET STUDY TECHNIQUE: Single contrast examination was performed using thin liquid barium. This exam was performed by Delon Beagle NP, and was supervised and interpreted by Dr. Thom Hall. FLUOROSCOPY: Radiation Exposure Index and estimated peak skin dose (PSD); Reference air kerma (RAK), 14.2 mGy. Kerma-area product (KAP), 292.1 uGy*m. COMPARISON:  Chest XR, 07/13/2023.  CTA CAP, 11/05/2021. FINDINGS: Swallowing: Appears normal. No vestibular penetration or aspiration seen. Pharynx: Unremarkable. Esophagus: Normal appearance. Esophageal motility: Mild esophageal dysmotility, with tertiary contractions. Otherwise, within normal limits. Hiatal Hernia: None. Gastroesophageal reflux: None visualized. Ingested 13mm barium tablet: Passed normally Other: None. IMPRESSION: No masses, lesions or significant dysmotility. Electronically Signed   By: Thom Hall M.D.   On: 07/14/2023 10:55      Impression / Plan:   Assessment: Principal Problem:   Acute respiratory failure with hypoxia (HCC) Active Problems:   Acute on chronic combined systolic and diastolic CHF (congestive heart failure) (HCC)   Acute pulmonary edema (HCC)   ESRD on dialysis (HCC)   Chest pain   Hypertensive emergency   Parathyroid  disease (HCC)   Hypercalcemia   Depression with anxiety   Dysphagia   Jemmie Ledgerwood is a 68 y.o. y/o female with a reported dysphagia despite a barium swallow that shows passage of  the 13 mm pill with some mild dysmotility but no significant other abnormalities.  Her biggest concern seems to be that  she has frequent vomiting with different foods that she eats.  Plan:  There is no need for a urgent endoscopy at this time while the patient is on anticoagulation and she is in a weakened state with this recent hospital admission.  The patient has been told to follow-up as an outpatient with GI for possible gastric emptying study to see if she is having gastroparesis due to her vomiting food many hours after she eats it.  She also has been told that the morning vomiting may be related to acid reflux and should also be evaluated as an outpatient.  The patient and her son agree that holding off on any endoscopic procedure at this time would be beneficial due to her weakened state and anticoagulation use.  You can consider a speech therapy consultation due to her reporting difficulty swallowing pills and initiating swallowing.  Nothing further to do from a GI point of view.  I will sign off.  Please call if any further GI concerns or questions.  We would like to thank you for the opportunity to participate in the care of Maleigh Bagot.   Thank you for involving me in the care of this patient.      LOS: 2 days   Teresa Copping, MD, MD. NOLIA 07/15/2023, 1:58 PM,  Pager [phone] 7am-5pm  Check AMION for 5pm -7am coverage and on weekends   Note: This dictation was prepared with Dragon dictation along with smaller phrase technology. Any transcriptional errors that result from this process are unintentional.

## 2023-07-16 DIAGNOSIS — N2889 Other specified disorders of kidney and ureter: Secondary | ICD-10-CM

## 2023-07-16 DIAGNOSIS — Z992 Dependence on renal dialysis: Secondary | ICD-10-CM

## 2023-07-16 DIAGNOSIS — N186 End stage renal disease: Secondary | ICD-10-CM | POA: Diagnosis not present

## 2023-07-16 DIAGNOSIS — R079 Chest pain, unspecified: Secondary | ICD-10-CM | POA: Diagnosis not present

## 2023-07-16 DIAGNOSIS — I5043 Acute on chronic combined systolic (congestive) and diastolic (congestive) heart failure: Secondary | ICD-10-CM | POA: Diagnosis not present

## 2023-07-16 DIAGNOSIS — J9601 Acute respiratory failure with hypoxia: Secondary | ICD-10-CM | POA: Diagnosis not present

## 2023-07-16 LAB — CBC
HCT: 23.3 % — ABNORMAL LOW (ref 36.0–46.0)
Hemoglobin: 7.9 g/dL — ABNORMAL LOW (ref 12.0–15.0)
MCH: 29.6 pg (ref 26.0–34.0)
MCHC: 33.9 g/dL (ref 30.0–36.0)
MCV: 87.3 fL (ref 80.0–100.0)
Platelets: 168 K/uL (ref 150–400)
RBC: 2.67 MIL/uL — ABNORMAL LOW (ref 3.87–5.11)
RDW: 15.7 % — ABNORMAL HIGH (ref 11.5–15.5)
WBC: 7.2 K/uL (ref 4.0–10.5)
nRBC: 0 % (ref 0.0–0.2)

## 2023-07-16 LAB — RENAL FUNCTION PANEL
Albumin: 3 g/dL — ABNORMAL LOW (ref 3.5–5.0)
Anion gap: 12 (ref 5–15)
BUN: 34 mg/dL — ABNORMAL HIGH (ref 8–23)
CO2: 24 mmol/L (ref 22–32)
Calcium: 10 mg/dL (ref 8.9–10.3)
Chloride: 97 mmol/L — ABNORMAL LOW (ref 98–111)
Creatinine, Ser: 6.98 mg/dL — ABNORMAL HIGH (ref 0.44–1.00)
GFR, Estimated: 6 mL/min — ABNORMAL LOW (ref >60)
Glucose, Bld: 105 mg/dL — ABNORMAL HIGH (ref 70–99)
Phosphorus: 4.5 mg/dL (ref 2.5–4.6)
Potassium: 4.7 mmol/L (ref 3.5–5.1)
Sodium: 133 mmol/L — ABNORMAL LOW (ref 135–145)

## 2023-07-16 LAB — GLUCOSE, CAPILLARY
Glucose-Capillary: 102 mg/dL — ABNORMAL HIGH (ref 70–99)
Glucose-Capillary: 121 mg/dL — ABNORMAL HIGH (ref 70–99)

## 2023-07-16 MED ORDER — POLYETHYLENE GLYCOL 3350 17 G PO PACK
17.0000 g | PACK | Freq: Every day | ORAL | Status: DC
  Administered 2023-07-17: 17 g via ORAL
  Filled 2023-07-16 (×2): qty 1

## 2023-07-16 MED ORDER — CALCIUM CARBONATE ANTACID 500 MG PO CHEW
1.0000 | CHEWABLE_TABLET | Freq: Two times a day (BID) | ORAL | Status: DC | PRN
  Administered 2023-07-16: 200 mg via ORAL
  Filled 2023-07-16: qty 1

## 2023-07-16 MED ORDER — ALUM & MAG HYDROXIDE-SIMETH 200-200-20 MG/5ML PO SUSP
30.0000 mL | Freq: Once | ORAL | Status: AC
  Administered 2023-07-16: 30 mL via ORAL
  Filled 2023-07-16: qty 30

## 2023-07-16 MED ORDER — ACETAMINOPHEN 325 MG PO TABS
ORAL_TABLET | ORAL | Status: AC
  Filled 2023-07-16: qty 2

## 2023-07-16 MED ORDER — ALUM & MAG HYDROXIDE-SIMETH 200-200-20 MG/5ML PO SUSP: 15.0000 mL | ORAL | Status: DC | PRN

## 2023-07-16 MED ORDER — PANTOPRAZOLE SODIUM 40 MG PO TBEC
40.0000 mg | DELAYED_RELEASE_TABLET | Freq: Two times a day (BID) | ORAL | Status: DC
  Administered 2023-07-16 – 2023-07-19 (×5): 40 mg via ORAL
  Filled 2023-07-16 (×5): qty 1

## 2023-07-16 MED ORDER — SENNOSIDES-DOCUSATE SODIUM 8.6-50 MG PO TABS: 2.0000 | ORAL_TABLET | Freq: Two times a day (BID) | ORAL | Status: DC

## 2023-07-16 NOTE — Progress Notes (Signed)
 Progress Note   Patient: Teresa Cook FMW:[id number] DOB: July 01, 1955 DOA: 07/13/2023     3 DOS: the patient was seen and examined on 07/16/2023   Brief hospital course: Teresa Cook is a 68 y.o. female with medical history significant for ESRD on HD, anemia secondary to ESRD, Anxiety, CHF, Depression, DVT right thigh and left leg, dysrhythmia, hypertension, gout, parathyroid  disease, sickle cell trait.  She presented to [facility] ED on 07/13/2023 with complaints of chest pressure and shortness of breath.  He also complains of difficulty swallowing all consistencies of food.   Ct showed: Negative examination for pulmonary embolism. 2. Moderate bilateral pleural effusions, diffuse interlobular septal thickening consistent with pulmonary edema. 3. Extensive heterogeneous and ground-glass airspace disease throughout the right upper lobe, consistent with superimposed infection or aspiration. 4. Cardiomegaly and coronary artery disease. 5. Enlargement of the tubular ascending thoracic aorta measuring up to 4.6 x 4.5 cm. Ascending thoracic aortic aneurysm.  6. Severely atrophic cystic kidneys, consistent with chronic renal disease and dialysis. 7. Enhancing mass arising from the inferior pole of the right kidney measuring 2.5 x 2.4 cm, consistent with renal cell carcinoma.   Principal Problem:   Acute respiratory failure with hypoxia (HCC) Active Problems:   Acute on chronic combined systolic and diastolic CHF (congestive heart failure) (HCC)   Hypertensive emergency   ESRD on dialysis (HCC)   Parathyroid  disease (HCC)   Hypercalcemia   Depression with anxiety   Acute pulmonary edema (HCC)   Chest pain   Paroxysmal atrial fibrillation (HCC)   Dysphagia   Acute HFrEF (heart failure with reduced ejection fraction) (HCC)   Renal mass   Assessment and Plan: Acute respiratory failure with hypoxemia. Multifactorial, secondary to aspiration pneumonia and pulmonary edema secondary to acute  congestive heart failure, pleural effusion. Patient had a significant respiratory distress with ambulation, she was requiring BiPAP.  Condition gradually improving.  Currently on 4 L oxygen, will continue wean off oxygen.  Acute on chronic systolic and diastolic CHF,  moderate bilateral pleural effusions and associated atelectasis: End-stage renal disease on dialysis. Elevated troponin secondary to demand ischemia. Repeat 2D echo on 07/15/2023 showed EF estimated at 35 to 40%, grade 2 diastolic dysfunction, moderately dilated left atrium, moderate MR. Patient is a followed by cardiology as well as nephrology.  Removed fluids through dialysis.  Volume status is better today  Right upper lobe aspiration pneumonia. Dysphagia. CT scan showed significant pneumonia in the right upper lobe consistent with aspiration.  She had significant dysphagia, high risk for aspiration.  Patient has been seen by GI, no plan for workup.  Will obtain speech therapy evaluation for Continue cefepime .  Continue Zithromax .  Hypertensive emergency:  Blood pressure better after dialysis.  Continue to follow  Acute on chronic anemia: Anemia of end-stage renal disease.   Patient received 1 unit of PRBC, hemoglobin appears to be better.  Continue to follow.       Bilateral renal masses concerning for renal cell carcinoma,  has been evaluated by urologist.  Outpatient MRI recommended for further evaluation.  She has also been seen by the oncologist, Dr. Jacobo, who will follow the patient in the outpatient setting.     Ascending thoracic aortic aneurysm 4.6 x 4.5 cm: Recommend semi-annual imaging followup by CTA or MRA and referral to cardiothoracic surgery as an outpatient  History of DVT. Paroxysmal atrial fibrillation. Eliquis  on hold due to low hemoglobin.         Subjective:  Patient seems to be doing  better today, but still require 40 oxygen, short of breath better.  No cough.  Physical  Exam: Vitals:   07/16/23 1334 07/16/23 1400 07/16/23 1430 07/16/23 1500  BP: (!) 147/89 130/80 127/80 121/69  Pulse: 71 72 72 72  Resp: (!) 23 19 19  (!) 8  Temp:      TempSrc:      SpO2: 100% 100% 100% 99%  Weight:      Height:       General exam: Appears calm and comfortable  Respiratory system: Clear to auscultation. Respiratory effort normal. Cardiovascular system: S1 & S2 heard, RRR. No JVD, murmurs, rubs, gallops or clicks. No pedal edema. Gastrointestinal system: Abdomen is nondistended, soft and nontender. No organomegaly or masses felt. Normal bowel sounds heard. Central nervous system: Alert and oriented x3. No focal neurological deficits. Extremities: Symmetric 5 x 5 power. Skin: No rashes, lesions or ulcers Psychiatry: Judgement and insight appear normal. Mood & affect appropriate.    Data Reviewed:  Reviewed CT scan result and the lab results.  Family Communication: Daughter updated over the phone.  Disposition: Status is: Inpatient Remains inpatient appropriate because: Severity of disease, IV treatment     Time spent: 55 minutes  Author: Murvin Mana, MD 07/16/2023 3:40 PM  For on call review [email or web address].

## 2023-07-16 NOTE — Plan of Care (Signed)
  Problem: Education: Goal: Ability to describe self-care measures that may prevent or decrease complications (Diabetes Survival Skills Education) will improve Outcome: Progressing   Problem: Coping: Goal: Ability to adjust to condition or change in health will improve Outcome: Progressing   Problem: Fluid Volume: Goal: Ability to maintain a balanced intake and output will improve Outcome: Progressing   Problem: Metabolic: Goal: Ability to maintain appropriate glucose levels will improve Outcome: Progressing   Problem: Nutritional: Goal: Maintenance of adequate nutrition will improve Outcome: Progressing   Problem: Clinical Measurements: Goal: Respiratory complications will improve Outcome: Progressing Goal: Cardiovascular complication will be avoided Outcome: Progressing   Problem: Pain Managment: Goal: General experience of comfort will improve and/or be controlled Outcome: Progressing   Problem: Safety: Goal: Ability to remain free from injury will improve Outcome: Progressing

## 2023-07-16 NOTE — Progress Notes (Signed)
  Progress Note  Patient Name: Teresa Cook Date of Encounter: 07/16/2023 [facility] Cardiologist: New  Interval Summary    Patient is feeling better after dialysis. Plan to repeat HD today. She feels breathing is better. She reports persistent chest tightness.   Vital Signs Vitals:   07/16/23 0014 07/16/23 0503 07/16/23 0503 07/16/23 0800  BP: 119/74 111/63 111/63 132/75  Pulse: 70 70 70 67  Resp: 16 16 16 18   Temp: 98.6 F (37 C) 98.5 F (36.9 C) 98.5 F (36.9 C) 98 F (36.7 C)  TempSrc:      SpO2: 93% 93% 96% 94%  Weight:   54 kg   Height:        Intake/Output Summary (Last 24 hours) at 07/16/2023 1009 Last data filed at 07/15/2023 2100 Gross per 24 hour  Intake 270 ml  Output --  Net 270 ml      07/16/2023    5:03 AM 07/15/2023    5:00 AM 07/14/2023    4:52 PM  Last 3 Weights  Weight (lbs) 119 lb 0.8 oz 118 lb 6.2 oz 115 lb 11.9 oz  Weight (kg) 54 kg 53.7 kg 52.5 kg      Telemetry/ECG  NSR 60s - Personally Reviewed  Physical Exam  GEN: No acute distress.   Neck: No JVD Cardiac: RRR, no murmurs, rubs, or gallops.  Respiratory: Clear to auscultation bilaterally. 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   - BNP 1279 and imaging with CHF - nephrology consulted and she underwent HD with improvement - today she appears euvolemic. 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  - chest  pain is fairly atypical - would not recommend lexiscan  due to GI issues - unsure BP can tolerate Imdur  - No ASA given Eliquis  - continue coreg  - not the best cath candidate at this time, but may benefit from one in the future   ESRD on HD - dialysis per nephrology  Chronic Anemia - Hgb 10.3>>7.9 - Eliquis  held   CAP - abx per IM   Pafib NSVT - Eliquis  2.5mg  BID held for Anemia - amiodarone  200mg  daily - GI saw and no plan for inpatient work-up   Concern for renal cell carcinoma - oncology saw with plan for outpatient work-up   Prolonged Qt - appears on multiple EKGs - may need to decrease amio/revisit psych meds    For questions or updates, please contact [facility] Please consult [email or web address] for contact info under       Signed, Teresa Sandy VEAR Fishman, PA-C

## 2023-07-16 NOTE — Progress Notes (Signed)
 [facility]  ROUNDING NOTE   Subjective:   Ms. Infiniti Hoefling was admitted to [facility] on 07/13/2023 for Renal mass [N28.89] Acute respiratory failure with hypoxia (HCC) [J96.01] Pneumonia of right lung due to infectious organism, unspecified part of lung [J18.9] Acute hypoxic respiratory failure (HCC) [J96.01]  Last hemodialysis treatment was Friday. Patient received 2:45 minutes of prescribed dialysis treatment. Patient is 2 kg above her dry weight.   Update Patient sitting up in bed Alert and oriented Denies pain Weaned to room air  Objective:  Vital signs in last 24 hours:  Temp:  [98 F (36.7 C)-98.6 F (37 C)] 98.4 F (36.9 C) (07/09 1314) Pulse Rate:  [67-87] 72 (07/09 1400) Resp:  [16-23] 19 (07/09 1400) BP: (111-150)/(63-91) 130/80 (07/09 1400) SpO2:  [93 %-100 %] 100 % (07/09 1400) Weight:  [54 kg-54.6 kg] 54.6 kg (07/09 1314)  Weight change: -0.2 kg Filed Weights   07/15/23 0500 07/16/23 0503 07/16/23 1314  Weight: 53.7 kg 54 kg 54.6 kg    Intake/Output: I/O last 3 completed shifts: In: 510 [P.O.:510] Out: -    Intake/Output this shift:  Total I/O In: 230 [P.O.:230] Out: -   Physical Exam: General: NAD  Head: Normocephalic, atraumatic. Moist oral mucosal membranes  Eyes: Anicteric  Lungs:  Clear to auscultation, room air  Heart: Regular rate and rhythm  Abdomen:  Soft, nontender  Extremities:  no peripheral edema.  Neurologic: Alert, awake  Skin: No lesions  Access: Right femoral permcath    Basic Metabolic Panel: Recent Labs  Lab 07/13/23 0645 07/15/23 1614  NA 136 136  K 3.7 3.6  CL 99 97*  CO2 21* 26  GLUCOSE 103* 138*  BUN 29* 23  CREATININE 7.62* 5.37*  CALCIUM  10.6* 9.9    Liver Function Tests: Recent Labs  Lab 07/13/23 0645  AST 26  ALT 25  ALKPHOS 76  BILITOT 0.9  PROT 7.1  ALBUMIN  3.5   Recent Labs  Lab 07/13/23 0645  LIPASE 25   No results for input(s): AMMONIA in the last 168  hours.  CBC: Recent Labs  Lab 07/13/23 0645 07/15/23 1614 07/15/23 2002 07/16/23 0516  WBC 11.7* 5.7  --  7.2  NEUTROABS 9.1*  --   --   --   HGB 10.3* 7.4* 7.9* 7.9*  HCT 30.4* 22.3* 23.5* 23.3*  MCV 85.2 87.5  --  87.3  PLT 148* 148*  --  168    Cardiac Enzymes: No results for input(s): CKTOTAL, CKMB, CKMBINDEX, TROPONINI in the last 168 hours.  BNP: Invalid input(s): POCBNP  CBG: Recent Labs  Lab 07/15/23 1157 07/15/23 1630 07/15/23 2216 07/16/23 0802 07/16/23 1223  GLUCAP 112* 139* 127* 102* 121*    Microbiology: Results for orders placed or performed during the hospital encounter of 07/13/23  Resp panel by RT-PCR (RSV, Flu A&B, Covid) Anterior Nasal Swab     Status: None   Collection Time: 07/13/23  6:45 AM   Specimen: Anterior Nasal Swab  Result Value Ref Range Status   SARS Coronavirus 2 by RT PCR NEGATIVE NEGATIVE Final    Comment: (NOTE) SARS-CoV-2 target nucleic acids are NOT DETECTED.  The SARS-CoV-2 RNA is generally detectable in upper respiratory specimens during the acute phase of infection. The lowest concentration of SARS-CoV-2 viral copies this assay can detect is 138 copies/mL. A negative result does not preclude SARS-Cov-2 infection and should not be used as the sole basis for treatment or other patient management decisions. A negative result may  occur with  improper specimen collection/handling, submission of specimen other than nasopharyngeal swab, presence of viral mutation(s) within the areas targeted by this assay, and inadequate number of viral copies(<138 copies/mL). A negative result must be combined with clinical observations, patient history, and epidemiological information. The expected result is Negative.  Fact Sheet for Patients:  BloggerCourse.com  Fact Sheet for Healthcare Providers:  SeriousBroker.it  This test is no t yet approved or cleared by the [place] FDA and  has been authorized for detection and/or diagnosis of SARS-CoV-2 by FDA under an Emergency Use Authorization (EUA). This EUA will remain  in effect (meaning this test can be used) for the duration of the COVID-19 declaration under Section 564(b)(1) of the Act, 21 U.S.C.section 360bbb-3(b)(1), unless the authorization is terminated  or revoked sooner.       Influenza A by PCR NEGATIVE NEGATIVE Final   Influenza B by PCR NEGATIVE NEGATIVE Final    Comment: (NOTE) The Xpert Xpress SARS-CoV-2/FLU/RSV plus assay is intended as an aid in the diagnosis of influenza from Nasopharyngeal swab specimens and should not be used as a sole basis for treatment. Nasal washings and aspirates are unacceptable for Xpert Xpress SARS-CoV-2/FLU/RSV testing.  Fact Sheet for Patients: BloggerCourse.com  Fact Sheet for Healthcare Providers: SeriousBroker.it  This test is not yet approved or cleared by the [place]  FDA and has been authorized for detection and/or diagnosis of SARS-CoV-2 by FDA under an Emergency Use Authorization (EUA). This EUA will remain in effect (meaning this test can be used) for the duration of the COVID-19 declaration under Section 564(b)(1) of the Act, 21 U.S.C. section 360bbb-3(b)(1), unless the authorization is terminated or revoked.     Resp Syncytial Virus by PCR NEGATIVE NEGATIVE Final    Comment: (NOTE) Fact Sheet for Patients: BloggerCourse.com  Fact Sheet for Healthcare Providers: SeriousBroker.it  This test is not yet approved or cleared by the [place]  FDA and has been authorized for detection and/or diagnosis of SARS-CoV-2 by FDA under an Emergency Use Authorization (EUA). This EUA will remain in effect (meaning this test can be used) for the duration of the COVID-19 declaration under Section 564(b)(1) of the Act, 21 U.S.C. section  360bbb-3(b)(1), unless the authorization is terminated or revoked.  Performed at [facility], [address] [place], [place] [address]   Blood culture (routine x 2)     Status: None (Preliminary result)   Collection Time: 07/13/23  9:30 AM   Specimen: BLOOD  Result Value Ref Range Status   Specimen Description BLOOD BLOOD LEFT FOREARM  Final   Special Requests   Final    BOTTLES DRAWN AEROBIC AND ANAEROBIC Blood Culture results may not be optimal due to an inadequate volume of blood received in culture bottles   Culture   Final    NO GROWTH 3 DAYS Performed at [facility], [address], [place], [place] [address]    Report Status PENDING  Incomplete  Blood culture (routine x 2)     Status: None (Preliminary result)   Collection Time: 07/13/23  9:40 AM   Specimen: BLOOD  Result Value Ref Range Status   Specimen Description BLOOD BLOOD RIGHT HAND  Final   Special Requests   Final    BOTTLES DRAWN AEROBIC AND ANAEROBIC Blood Culture results may not be optimal due to an inadequate volume of blood received in culture bottles   Culture   Final    NO GROWTH 3 DAYS Performed at [facility], [address] [place], [place] [address]    Report Status PENDING  Incomplete  MRSA Next Gen by PCR, Nasal     Status: None   Collection Time: 07/14/23  5:30 AM   Specimen: Nasal Mucosa; Nasal Swab  Result Value Ref Range Status   MRSA by PCR Next Gen NOT DETECTED NOT DETECTED Final    Comment: (NOTE) The GeneXpert MRSA Assay (FDA approved for NASAL specimens only), is one component of a comprehensive MRSA colonization surveillance program. It is not intended to diagnose MRSA infection nor to guide or monitor treatment for MRSA infections. Test performance is not FDA approved in patients less than 32 years old. Performed at [facility], [address] [place], [place] [address]     Coagulation Studies: No results for input(s): LABPROT,  INR in the last 72 hours.  Urinalysis: No results for input(s): COLORURINE, LABSPEC, PHURINE, GLUCOSEU, HGBUR, BILIRUBINUR, KETONESUR, PROTEINUR, UROBILINOGEN, NITRITE, LEUKOCYTESUR in the last 72 hours.  Invalid input(s): APPERANCEUR    Imaging: ECHOCARDIOGRAM COMPLETE Result Date: 07/15/2023    ECHOCARDIOGRAM REPORT   Patient Name:   Ishani Litton Date of Exam: 07/15/2023 Medical Rec #:  [id number]       Height:       65.0 in Accession #:    [id number]      Weight:       118.4 lb Date of Birth:  Oct 28, 1955       BSA:          1.583 m Patient Age:    68 years        BP:           112/63 mmHg Patient Gender: F               HR:           68 bpm. Exam Location:  [facility] Procedure: 2D Echo, Cardiac Doppler and Color Doppler (Both Spectral and Color            Flow Doppler were utilized during procedure). Indications:     Dyspnea R06.00  History:         Patient has prior history of Echocardiogram examinations, most                  recent 11/07/2021. CHF; Risk Factors:Hypertension.  Sonographer:     Christopher Furnace Referring Phys:  [id number] AVA SWAYZE Diagnosing Phys: Deatrice Cage MD IMPRESSIONS  1. Left ventricular ejection fraction, by estimation, is 35 to 40%. The left ventricle has moderately decreased function. The left ventricle demonstrates global hypokinesis. There is mild left ventricular hypertrophy. Left ventricular diastolic parameters are consistent with Grade II diastolic dysfunction (pseudonormalization).  2. Right ventricular systolic function is normal. The right ventricular size is normal. Tricuspid regurgitation signal is inadequate for assessing PA pressure.  3. Left atrial size was moderately dilated.  4. 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 No evidence of mitral stenosis. Moderate mitral annular calcification.  5. The aortic valve is calcified. Aortic valve regurgitation is trivial. Aortic valve sclerosis/calcification is present, without any  evidence of aortic stenosis.  6. The inferior vena cava is normal in size with greater than 50% respiratory variability, suggesting right atrial pressure of 3 mmHg. FINDINGS  Left Ventricle: Left ventricular ejection fraction, by estimation, is 35 to 40%. The left ventricle has moderately decreased function. The left ventricle demonstrates global hypokinesis. The left ventricular internal cavity size was normal in size. There is mild  left ventricular hypertrophy. Left ventricular diastolic parameters are consistent with Grade II diastolic dysfunction (pseudonormalization). Right Ventricle: The right ventricular size is normal. No increase in right ventricular wall thickness. Right ventricular systolic function is normal. Tricuspid regurgitation signal is inadequate for assessing PA pressure. The tricuspid regurgitant velocity is 1.82 m/s, and with an assumed right atrial pressure of 3 mmHg, the estimated right ventricular systolic pressure is 16.2 mmHg. Left Atrium: Left atrial size was moderately dilated. Right Atrium: Right atrial size was normal in size. Pericardium: There is no evidence of pericardial effusion. Mitral Valve: The mitral valve is myxomatous. There is moderate thickening of the mitral valve leaflet(s). Moderate mitral annular calcification. Moderate mitral valve regurgitation. No evidence of mitral valve stenosis. MV peak gradient, 6.9 mmHg. The mean mitral valve gradient is 3.0 mmHg. Tricuspid Valve: The tricuspid valve is normal in structure. Tricuspid valve regurgitation is trivial. No evidence of tricuspid stenosis. Aortic Valve: The aortic valve is calcified. Aortic valve regurgitation is trivial. Aortic valve sclerosis/calcification is present, without any evidence of aortic stenosis. Aortic valve mean gradient measures 4.0 mmHg. Aortic valve peak gradient measures 5.9 mmHg. Aortic valve area, by VTI measures 2.04 cm. Pulmonic Valve: The pulmonic valve was normal in structure. Pulmonic valve  regurgitation is not visualized. No evidence of pulmonic stenosis. Aorta: The aortic root is normal in size and structure. Venous: The inferior vena cava is normal in size with greater than 50% respiratory variability, suggesting right atrial pressure of 3 mmHg. IAS/Shunts: No atrial level shunt detected by color flow Doppler.  LEFT VENTRICLE PLAX 2D LVIDd:         3.65 cm   Diastology LVIDs:         2.42 cm   LV e' medial:    3.87 cm/s LV PW:         1.31 cm   LV E/e' medial:  28.9 LV IVS:        1.34 cm   LV e' lateral:   4.06 cm/s LVOT diam:     2.00 cm   LV E/e' lateral: 27.6 LV SV:         49 LV SV Index:   31 LVOT Area:     3.14 cm  RIGHT VENTRICLE RV Basal diam:  3.42 cm RV Mid diam:    2.95 cm LEFT ATRIUM            Index         RIGHT ATRIUM           Index LA diam:      3.20 cm  2.02 cm/m    RA Area:     14.80 cm LA Vol (A2C): 170.0 ml 107.39 ml/m  RA Volume:   36.00 ml  22.74 ml/m LA Vol (A4C): 69.0 ml  43.59 ml/m  AORTIC VALVE AV Area (Vmax):    1.99 cm AV Area (Vmean):   1.93 cm AV Area (VTI):     2.04 cm AV Vmax:           121.47 cm/s AV Vmean:          86.600 cm/s AV VTI:            0.242 m AV Peak Grad:      5.9 mmHg AV Mean Grad:      4.0 mmHg LVOT Vmax:         77.00 cm/s LVOT Vmean:        53.200 cm/s LVOT VTI:  0.157 m LVOT/AV VTI ratio: 0.65  AORTA Ao Root diam: 3.70 cm MITRAL VALVE                TRICUSPID VALVE MV Area (PHT): 3.85 cm     TR Peak grad:   13.2 mmHg MV Area VTI:   1.55 cm     TR Vmax:        182.00 cm/s MV Peak grad:  6.9 mmHg MV Mean grad:  3.0 mmHg     SHUNTS MV Vmax:       1.31 m/s     Systemic VTI:  0.16 m MV Vmean:      79.6 cm/s    Systemic Diam: 2.00 cm MV Decel Time: 197 msec MV E velocity: 112.00 cm/s MV A velocity: 63.50 cm/s MV E/A ratio:  1.76 Deatrice Cage MD Electronically signed by Deatrice Cage MD Signature Date/Time: 07/15/2023/1:22:57 PM    Final      Medications:    ceFEPime  (MAXIPIME ) IV 1 g (07/16/23 0835)    sodium chloride     Intravenous Once   amiodarone   200 mg Oral Daily   carvedilol   12.5 mg Oral BID   Chlorhexidine  Gluconate Cloth  6 each Topical Q0600   insulin  aspart  0-6 Units Subcutaneous TID WC   multivitamin  1 tablet Oral Daily   pantoprazole   40 mg Oral BID AC   polyethylene glycol  17 g Oral Daily   sacubitril -valsartan   1 tablet Oral BID   sertraline   150 mg Oral Daily   traZODone   50 mg Oral QHS   zolpidem   5 mg Oral QHS   acetaminophen , calcium  carbonate, HYDROcodone -acetaminophen   Assessment/ Plan:  Ms. Obera Stauch is a 68 y.o.  female with end stage renal disease on hemodialysis, hypertension, hyperlipidemia, congestive heart failure, atrial fibrillation, gout, and DVT who is admitted to [facility] on 07/13/2023 for Renal mass [N28.89] Acute respiratory failure with hypoxia (HCC) [J96.01] Pneumonia of right lung due to infectious organism, unspecified part of lung [J18.9] Acute hypoxic respiratory failure (HCC) [J96.01]  [facility] Right femoral catheter 55kg.   End Stage Renal Disease: 2kg above target weight and acute pulmonary failure.  - Will receive dialysis today, UF 1L as tolerated.  - Next treatment scheduled for Friday.  - Will determine dry weight with post dialysis weight today.   Acute Respiratory failure: requiring [place] O2. With pulmonary edema and right upper lobe pneumonia.  - empiric antibiotics, cefepime  - Remains on room air  Hypertension with chronic kidney disease:  - home regimen of isosorbide  mononitrate, Entresto , and carvedilol  - Blood pressure control improved  Secondary Hyperparathyroidism with hypercalcemia.  - receives etelcalcetide as outpatient.  - sevelamer  with meals.   Renal mass: right. Consistent with RCC. Incidental finding.  - Consult urology for outpatient follow up.  - hold esa.     LOS: 3 Daymian Lill 7/9/20252:43 PM

## 2023-07-16 NOTE — Progress Notes (Signed)
  Received patient in bed to unit.   Informed consent signed and in chart.    TX duration:3.15     Transported by  Hand-off given to patient's nurse.    Access used: Right femoral cath Access issues: none   Total UF removed: 1.0 kg Medication(s) given: tylenol , mylanta Post HD VS: wnl Post HD weight: 54.6 kg     N. Elvyn Krohn LPN Kidney Dialysis Unit

## 2023-07-16 NOTE — Hospital Course (Signed)
 Teresa Cook is a 68 y.o. female with medical history significant for ESRD on HD, anemia secondary to ESRD, Anxiety, CHF, Depression, DVT right thigh and left leg, dysrhythmia, hypertension, gout, parathyroid  disease, sickle cell trait.  She presented to [facility] ED on 07/13/2023 with complaints of chest pressure and shortness of breath.  He also complains of difficulty swallowing all consistencies of food.   Ct showed: Negative examination for pulmonary embolism. 2. Moderate bilateral pleural effusions, diffuse interlobular septal thickening consistent with pulmonary edema. 3. Extensive heterogeneous and ground-glass airspace disease throughout the right upper lobe, consistent with superimposed infection or aspiration. 4. Cardiomegaly and coronary artery disease. 5. Enlargement of the tubular ascending thoracic aorta measuring up to 4.6 x 4.5 cm. Ascending thoracic aortic aneurysm.  6. Severely atrophic cystic kidneys, consistent with chronic renal disease and dialysis. 7. Enhancing mass arising from the inferior pole of the right kidney measuring 2.5 x 2.4 cm, consistent with renal cell carcinoma.

## 2023-07-16 NOTE — Progress Notes (Signed)
 SLP Cancellation Note  Patient Details Name: Teresa Cook MRN: [id number] DOB: 05/14/1955   Cancelled treatment:       Reason Eval/Treat Not Completed: Patient at procedure or test/unavailable (pt at dialysis).   Chart review completed, with pt report of all consistencies of intake get stuck in her esophagus. Many times it just comes back up. Additionally report of pill dysphagia and difficulty initiating swallow. Barium swallow assessment completed 07/14/23, revealing mild esophageal dysmotility with tertiary contractions. GI note from this admission reporting, There is no need for a urgent endoscopy at this time while the patient is on anticoagulation and she is in a weakened state with this recent hospital admission. The patient has been told to follow-up as an outpatient with GI for possible gastric emptying study to see if she is having gastroparesis due to her vomiting food many hours after she eats it. She also has been told that the morning vomiting may be related to acid reflux and should also be evaluated as an outpatient. The patient and her son agree that holding off on any endoscopic procedure at this time would be beneficial due to her weakened state and anticoagulation use.   SLP will plan to follow up for bedside swallow assessment when pt is available.   [place] Jeremian Whitby Clapp, MS, CCC-SLP Speech Language Pathologist Rehab Services; [facility] [phone] (ascom)   [place] J Clapp 07/16/2023, 2:46 PM

## 2023-07-17 DIAGNOSIS — I5043 Acute on chronic combined systolic (congestive) and diastolic (congestive) heart failure: Secondary | ICD-10-CM | POA: Diagnosis not present

## 2023-07-17 DIAGNOSIS — R079 Chest pain, unspecified: Secondary | ICD-10-CM | POA: Diagnosis not present

## 2023-07-17 DIAGNOSIS — Z992 Dependence on renal dialysis: Secondary | ICD-10-CM | POA: Diagnosis not present

## 2023-07-17 DIAGNOSIS — J9601 Acute respiratory failure with hypoxia: Secondary | ICD-10-CM | POA: Diagnosis not present

## 2023-07-17 DIAGNOSIS — N186 End stage renal disease: Secondary | ICD-10-CM | POA: Diagnosis not present

## 2023-07-17 LAB — TYPE AND SCREEN
ABO/RH(D): O POS
Antibody Screen: NEGATIVE
Unit division: 0
Unit division: 0
Unit division: 0

## 2023-07-17 LAB — BPAM RBC
Blood Product Expiration Date: 202508032359
Blood Product Expiration Date: 202508032359
Blood Product Expiration Date: 202508032359
Unit Type and Rh: 5100
Unit Type and Rh: 5100
Unit Type and Rh: 5100

## 2023-07-17 LAB — CBC
HCT: 22 % — ABNORMAL LOW (ref 36.0–46.0)
Hemoglobin: 7.4 g/dL — ABNORMAL LOW (ref 12.0–15.0)
MCH: 29 pg (ref 26.0–34.0)
MCHC: 33.6 g/dL (ref 30.0–36.0)
MCV: 86.3 fL (ref 80.0–100.0)
Platelets: 169 K/uL (ref 150–400)
RBC: 2.55 MIL/uL — ABNORMAL LOW (ref 3.87–5.11)
RDW: 15.5 % (ref 11.5–15.5)
WBC: 6.1 K/uL (ref 4.0–10.5)
nRBC: 0 % (ref 0.0–0.2)

## 2023-07-17 LAB — PREPARE RBC (CROSSMATCH)

## 2023-07-17 LAB — IRON AND TIBC
Iron: 50 ug/dL (ref 28–170)
Saturation Ratios: 21 % (ref 10.4–31.8)
TIBC: 244 ug/dL — ABNORMAL LOW (ref 250–450)
UIBC: 194 ug/dL

## 2023-07-17 LAB — GLUCOSE, CAPILLARY
Glucose-Capillary: 87 mg/dL (ref 70–99)
Glucose-Capillary: 104 mg/dL — ABNORMAL HIGH (ref 70–99)
Glucose-Capillary: 113 mg/dL — ABNORMAL HIGH (ref 70–99)
Glucose-Capillary: 108 mg/dL — ABNORMAL HIGH (ref 70–99)

## 2023-07-17 LAB — FERRITIN: Ferritin: 771 ng/mL — ABNORMAL HIGH (ref 11–307)

## 2023-07-17 MED ORDER — SENNOSIDES-DOCUSATE SODIUM 8.6-50 MG PO TABS
2.0000 | ORAL_TABLET | Freq: Two times a day (BID) | ORAL | Status: DC
  Filled 2023-07-17 (×4): qty 2

## 2023-07-17 MED ORDER — ENSURE PLUS HIGH PROTEIN PO LIQD
237.0000 mL | Freq: Two times a day (BID) | ORAL | Status: DC
  Administered 2023-07-17 – 2023-07-19 (×4): 237 mL via ORAL

## 2023-07-17 MED ORDER — SODIUM CHLORIDE 0.9% IV SOLUTION: Freq: Once | INTRAVENOUS | Status: AC

## 2023-07-17 NOTE — Progress Notes (Signed)
 Rounding Note   Patient Name: Teresa Cook Date of Encounter: 07/17/2023  [facility] Cardiologist: Medford Hanson, [facility]  Subjective Reports feeling well, denies any chest pain, no shortness of breath, no palpitations or tachycardia Completed dialysis yesterday afternoon 5 PM, 1 kg fluid removed  On examination : alert oriented, no JVD, lungs clear to auscultation bilaterally, heart sounds regular normal S1-S2 no murmurs appreciated, abdomen soft nontender no significant lower extremity edema.  Musculoskeletal exam with good range of motion, neurologic exam grossly nonfocal  Scheduled Meds:  sodium chloride    Intravenous Once   amiodarone   200 mg Oral Daily   carvedilol   12.5 mg Oral BID   Chlorhexidine  Gluconate Cloth  6 each Topical Q0600   feeding supplement  237 mL Oral BID BM   insulin  aspart  0-6 Units Subcutaneous TID WC   multivitamin  1 tablet Oral Daily   pantoprazole   40 mg Oral BID AC   polyethylene glycol  17 g Oral Daily   sacubitril -valsartan   1 tablet Oral BID   senna-docusate  2 tablet Oral BID   sertraline   150 mg Oral Daily   traZODone   50 mg Oral QHS   zolpidem   5 mg Oral QHS   Continuous Infusions:  PRN Meds: acetaminophen , calcium  carbonate   Vital Signs  Vitals:   07/17/23 0745 07/17/23 1133 07/17/23 1401 07/17/23 1420  BP: 132/80 127/83 (!) 146/86 138/88  Pulse: 70 79 72 73  Resp: 17 19 18 20   Temp: 97.9 F (36.6 C) 98.1 F (36.7 C) 98.5 F (36.9 C) 98.4 F (36.9 C)  TempSrc: Oral  Oral Oral  SpO2: 96% 99% 98% 100%  Weight:      Height:        Intake/Output Summary (Last 24 hours) at 07/17/2023 1458 Last data filed at 07/17/2023 1027 Gross per 24 hour  Intake 320 ml  Output 1 ml  Net 319 ml      07/17/2023    5:00 AM 07/16/2023    1:14 PM 07/16/2023    5:03 AM  Last 3 Weights  Weight (lbs) 120 lb 5.9 oz 120 lb 5.9 oz 119 lb 0.8 oz  Weight (kg) 54.6 kg 54.6 kg 54 kg      Telemetry  - Personally Reviewed  ECG    - Personally Reviewed  Physical Exam  GEN: No acute distress.   Neck: No JVD Cardiac: RRR, no murmurs, rubs, or gallops.  Respiratory: Clear to auscultation bilaterally. GI: Soft, nontender, non-distended  MS: No edema; No deformity. Neuro:  Nonfocal  Psych: Normal affect   Labs High Sensitivity Troponin:   Recent Labs  Lab 07/13/23 0645 07/13/23 0836  TROPONINIHS 40* 48*     Chemistry Recent Labs  Lab 07/13/23 0645 07/15/23 1614 07/16/23 1522  NA 136 136 133*  K 3.7 3.6 4.7  CL 99 97* 97*  CO2 21* 26 24  GLUCOSE 103* 138* 105*  BUN 29* 23 34*  CREATININE 7.62* 5.37* 6.98*  CALCIUM  10.6* 9.9 10.0  PROT 7.1  --   --   ALBUMIN  3.5  --  3.0*  AST 26  --   --   ALT 25  --   --   ALKPHOS 76  --   --   BILITOT 0.9  --   --   GFRNONAA 5* 8* 6*  ANIONGAP 16* 13 12    Lipids No results for input(s): CHOL, TRIG, HDL, LABVLDL, LDLCALC, CHOLHDL in the last 168 hours.  Hematology Recent Labs  Lab 07/15/23 1614 07/15/23 2002 07/16/23 0516 07/17/23 0558  WBC 5.7  --  7.2 6.1  RBC 2.55*  --  2.67* 2.55*  HGB 7.4* 7.9* 7.9* 7.4*  HCT 22.3* 23.5* 23.3* 22.0*  MCV 87.5  --  87.3 86.3  MCH 29.0  --  29.6 29.0  MCHC 33.2  --  33.9 33.6  RDW 15.7*  --  15.7* 15.5  PLT 148*  --  168 169   Thyroid  No results for input(s): TSH, FREET4 in the last 168 hours.  BNP Recent Labs  Lab 07/13/23 0645  BNP 1,279.7*    DDimer No results for input(s): DDIMER in the last 168 hours.   Radiology  No results found.  Cardiac Studies Echo  1. Left ventricular ejection fraction, by estimation, is 35 to 40%. The  left ventricle has moderately decreased function. The left ventricle  demonstrates global hypokinesis. There is mild left ventricular  hypertrophy. Left ventricular diastolic  parameters are consistent with Grade II diastolic dysfunction  (pseudonormalization).   2. Right ventricular systolic function is normal. The right ventricular  size is  normal. Tricuspid regurgitation signal is inadequate for assessing  PA pressure.   3. Left atrial size was moderately dilated.   4. 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  No evidence of mitral stenosis. Moderate mitral annular calcification.   5. The aortic valve is calcified. Aortic valve regurgitation is trivial.  Aortic valve sclerosis/calcification is present, without any evidence of  aortic stenosis.   6. The inferior vena cava is normal in size with greater than 50%  respiratory variability, suggesting right atrial pressure of 3 mmHg.   Patient Profile   Teresa Cook is a 68 y.o. female with a hx of ESRD on HD on Monday Wednesday and Friday, anemia secondary to ESRD, anxiety, HFrEF, depression, DVT in the right thigh and left leg, hypertension, gout, parathyroid  disease, sickle cell trait who is being seen 07/15/2023 for the evaluation of CHF and elevated troponin   Assessment & Plan  Acute respiratory failure Multifactorial including pulmonary edema in the setting of end-stage renal disease on hemodialysis Cardiomyopathy with ejection fraction 35 to 40%, mitral valve regurgitation, anemia (baseline 13, now running 7.4) --Symptoms improving with hemodialysis - On carvedilol  12.5 twice daily, Entresto  24/26 twice daily Not on Jardiance/Farxiga/spironolactone in the setting of renal failure --Consider Lasix  on nondialysis days   Chest pain Atypical in nature, reports a tightness over the past several years, attributes this to scar tissue from her breast surgery -Possibly exacerbated by fluid overload, anemia -No plans for ischemic workup at this time - Prior cardiac catheterization 2022 nonobstructive disease  -Stable on today's visit  End-stage renal disease on hemodialysis Nephrology following, underwent HD July 7, July 9   Acute on chronic anemia Hemoglobin 13 now running in the 7 range Eliquis  on hold   Paroxysmal atrial  fibrillation/nonsustained VT Amiodarone  200 daily Eliquis  2.5 twice daily on hold in the setting of hemoglobin in the 7 range   Chronic nausea, vomiting, dysphagia Seen by GI, plan for outpatient gastric emptying study Swallow study completed showing no significant dysmotility Already on PPI, consider twice daily dosing - Mylanta yesterday for symptoms of nausea, acid - Has not been treated with antinausea medications in the setting of prolonged QTc    [facility] will sign off.   Medication Recommendations: No further medication changes Other recommendations (labs, testing, etc): No further testing at this time Follow  up as an outpatient: Outpatient follow-up in cardiology clinic Dr. JAYSON. End   Signed, Eliot Popper, MD  07/17/2023, 2:58 PM

## 2023-07-17 NOTE — Progress Notes (Signed)
 Progress Note   Patient: Teresa Cook FMW:[id number] DOB: Aug 12, 1955 DOA: 07/13/2023     4 DOS: the patient was seen and examined on 07/17/2023   Brief hospital course: Darria Corvera is a 68 y.o. female with medical history significant for ESRD on HD, anemia secondary to ESRD, Anxiety, CHF, Depression, DVT right thigh and left leg, dysrhythmia, hypertension, gout, parathyroid  disease, sickle cell trait.  She presented to [facility] ED on 07/13/2023 with complaints of chest pressure and shortness of breath.  He also complains of difficulty swallowing all consistencies of food.   Ct showed: Negative examination for pulmonary embolism. 2. Moderate bilateral pleural effusions, diffuse interlobular septal thickening consistent with pulmonary edema. 3. Extensive heterogeneous and ground-glass airspace disease throughout the right upper lobe, consistent with superimposed infection or aspiration. 4. Cardiomegaly and coronary artery disease. 5. Enlargement of the tubular ascending thoracic aorta measuring up to 4.6 x 4.5 cm. Ascending thoracic aortic aneurysm.  6. Severely atrophic cystic kidneys, consistent with chronic renal disease and dialysis. 7. Enhancing mass arising from the inferior pole of the right kidney measuring 2.5 x 2.4 cm, consistent with renal cell carcinoma.   Principal Problem:   Acute respiratory failure with hypoxia (HCC) Active Problems:   Acute on chronic combined systolic and diastolic CHF (congestive heart failure) (HCC)   Hypertensive emergency   ESRD on dialysis (HCC)   Parathyroid  disease (HCC)   Hypercalcemia   Depression with anxiety   Acute pulmonary edema (HCC)   Chest pain   Paroxysmal atrial fibrillation (HCC)   Dysphagia   Acute HFrEF (heart failure with reduced ejection fraction) (HCC)   Renal mass   Assessment and Plan: Acute respiratory failure with hypoxemia. Multifactorial, secondary to aspiration pneumonia and pulmonary edema secondary to acute  congestive heart failure, pleural effusion. Patient had a significant respiratory distress with ambulation, she was requiring BiPAP.   Condition has improved, patient has been off oxygen.   Acute on chronic systolic and diastolic CHF,  moderate bilateral pleural effusions and associated atelectasis: End-stage renal disease on dialysis. Elevated troponin secondary to demand ischemia. Repeat 2D echo on 07/15/2023 showed EF estimated at 35 to 40%, grade 2 diastolic dysfunction, moderately dilated left atrium, moderate MR. Patient is a followed by cardiology as well as nephrology.  Removed fluids through dialysis.   Volume status improved.   Right upper lobe aspiration pneumonia. Dysphagia. CT scan showed significant pneumonia in the right upper lobe consistent with aspiration.  She had significant dysphagia, high risk for aspiration.  Patient has been seen by GI, no plan for workup.   Completed course of antibiotics. Seen by speech therapist, no additional risk of aspiration   Hypertensive emergency:  Blood pressure better after dialysis.  Continue to follow   Acute on chronic anemia: Anemia of end-stage renal disease.   Hemoglobin dropped down to 7.4, will give the second unit of PRBC.  Iron level normal, B12 level pending.     Bilateral renal masses concerning for renal cell carcinoma,  has been evaluated by urologist.  Outpatient MRI recommended for further evaluation.  She has also been seen by the oncologist, Dr. Jacobo, who will follow the patient in the outpatient setting.     Ascending thoracic aortic aneurysm 4.6 x 4.5 cm: Recommend semi-annual imaging followup by CTA or MRA and referral to cardiothoracic surgery as an outpatient   History of DVT. Paroxysmal atrial fibrillation. Eliquis  on hold due to low hemoglobin.  Subjective:  Patient doing well, off oxygen, no shortness of breath.  Physical Exam: Vitals:   07/17/23 0439 07/17/23 0500 07/17/23  0745 07/17/23 1133  BP: 127/64  132/80 127/83  Pulse: 72  70 79  Resp: 18  17 19   Temp: (!) 97.1 F (36.2 C)  97.9 F (36.6 C) 98.1 F (36.7 C)  TempSrc:   Oral   SpO2: 94%  96% 99%  Weight:  54.6 kg    Height:       General exam: Appears calm and comfortable  Respiratory system: Clear to auscultation. Respiratory effort normal. Cardiovascular system: S1 & S2 heard, RRR. No JVD, murmurs, rubs, gallops or clicks. No pedal edema. Gastrointestinal system: Abdomen is nondistended, soft and nontender. No organomegaly or masses felt. Normal bowel sounds heard. Central nervous system: Alert and oriented. No focal neurological deficits. Extremities: Symmetric 5 x 5 power. Skin: No rashes, lesions or ulcers Psychiatry: Judgement and insight appear normal. Mood & affect appropriate.    Data Reviewed:  Lab results reviewed  Family Communication: None  Disposition: Status is: Inpatient Remains inpatient appropriate because: Severity of disease, IV treatment     Time spent: 35 minutes  Author: Murvin Mana, MD 07/17/2023 1:58 PM  For on call review [email or web address].

## 2023-07-17 NOTE — Evaluation (Signed)
 Physical Therapy Evaluation Patient Details Name: Teresa Cook MRN: [id number] DOB: [birth year] Today's Date: 07/17/2023  History of Present Illness  Teresa Cook is a 68 y.o. female with medical history significant for ESRD-HD (MWF), hypertension, gout, depression with anxiety, paroxysmal A-fib on Coumadin , sCHF with EF 40-45%, bilateral breast cancer (s/p bilateral mastectomy, seizure not on medications, meningioma and chronic headaches, hyperparathyroidism, presented to ED for shortness of breath, chest pain.   Clinical Impression  Patient A&Ox4, reported she had been up independently in her room as needed during admission. At baseline she said she is independent, lives alone, has family that can check on her but she performs her own IADLs. 3 falls in the last 6 months.   She was modI for bed mobility, and supervision for transfers and ambulation, no AD. Intermittent standing rest breaks needed due to fatigue/lightheadedness, HR and spO2 WFLs. Noted for mild gait path deviations as well, no true LOB however. Pt endorsed mild  unsteadiness at home as well. PT/pt reviewed potential of benefit of use of SPC, pt agreeable.  Overall the patient demonstrated deficits (see PT Problem List) that impede the patient's functional abilities, safety, and mobility and would benefit from skilled PT intervention.         If plan is discharge home, recommend the following: Other (comment) (NA)   Can travel by private vehicle        Equipment Recommendations Other (comment) (pt agreeable to [facility], will obtain at home)  Recommendations for Other Services       Functional Status Assessment Patient has had a recent decline in their functional status and demonstrates the ability to make significant improvements in function in a reasonable and predictable amount of time.     Precautions / Restrictions Precautions Precautions: Fall Recall of Precautions/Restrictions: Intact Restrictions Weight  Bearing Restrictions Per Provider Order: No      Mobility  Bed Mobility Overal bed mobility: Modified Independent                  Transfers Overall transfer level: Needs assistance Equipment used: None Transfers: Sit to/from Stand Sit to Stand: Supervision                Ambulation/Gait Ambulation/Gait assistance: Supervision Gait Distance (Feet): 180 Feet Assistive device: None         General Gait Details: with intermittent light headedness, a few standing rest breaks. no true LOB but pt does have mild gait path deviations  Stairs            Wheelchair Mobility     Tilt Bed    Modified Rankin (Stroke Patients Only)       Balance Overall balance assessment: Needs assistance Sitting-balance support: Feet supported Sitting balance-Leahy Scale: Good     Standing balance support: During functional activity, No upper extremity supported Standing balance-Leahy Scale: Good                               Pertinent Vitals/Pain Pain Assessment Pain Assessment: No/denies pain    Home Living Family/patient expects to be discharged to:: Private residence Living Arrangements: Alone Available Help at Discharge: Family Type of Home: House Home Access: Stairs to enter Entrance Stairs-Rails: Left Entrance Stairs-Number of Steps: 3   Home Layout: One level Home Equipment: None Additional Comments: 3 falls in the last 6 months    Prior Function Prior Level of Function : Independent/Modified Independent  Extremity/Trunk Assessment   Upper Extremity Assessment Upper Extremity Assessment: Generalized weakness    Lower Extremity Assessment Lower Extremity Assessment: Generalized weakness    Cervical / Trunk Assessment Cervical / Trunk Assessment: Normal  Communication        Cognition Arousal: Alert Behavior During Therapy: WFL for tasks assessed/performed   PT - Cognitive impairments: No  apparent impairments                         Following commands: Intact       Cueing       General Comments      Exercises     Assessment/Plan    PT Assessment Patient needs continued PT services  PT Problem List Decreased strength;Decreased activity tolerance;Decreased balance;Decreased mobility       PT Treatment Interventions DME instruction;Balance training;Gait training;Neuromuscular re-education;Stair training;Functional mobility training;Patient/family education;Therapeutic activities;Therapeutic exercise    PT Goals (Current goals can be found in the Care Plan section)  Acute Rehab PT Goals Patient Stated Goal: to go home PT Goal Formulation: With patient Time For Goal Achievement: 07/31/23 Potential to Achieve Goals: Good    Frequency Min 1X/week     Co-evaluation               AM-PAC PT 6 Clicks Mobility  Outcome Measure Help needed turning from your back to your side while in a flat bed without using bedrails?: None Help needed moving from lying on your back to sitting on the side of a flat bed without using bedrails?: None Help needed moving to and from a bed to a chair (including a wheelchair)?: None Help needed standing up from a chair using your arms (e.g., wheelchair or bedside chair)?: None Help needed to walk in hospital room?: None Help needed climbing 3-5 steps with a railing? : A Little 6 Click Score: 23    End of Session   Activity Tolerance: Patient tolerated treatment well Patient left: in bed;with call bell/phone within reach;with bed alarm set Nurse Communication: Mobility status PT Visit Diagnosis: Other abnormalities of gait and mobility (R26.89);Difficulty in walking, not elsewhere classified (R26.2);Muscle weakness (generalized) (M62.81)    Time: 9092-9078 PT Time Calculation (min) (ACUTE ONLY): 14 min   Charges:   PT Evaluation $PT Eval Low Complexity: 1 Low PT Treatments $Therapeutic Activity: 8-22  mins PT General Charges $$ ACUTE PT VISIT: 1 Visit        Doyal Shams PT, DPT 10:00 AM,07/17/23

## 2023-07-17 NOTE — Plan of Care (Signed)

## 2023-07-17 NOTE — Evaluation (Signed)
 Clinical/Bedside Swallow Evaluation Patient Details  Name: Teresa Cook MRN: [id number] Date of Birth: 12-27-1955  Today's Date: 07/17/2023 Time: SLP Start Time (ACUTE ONLY): 9074 SLP Stop Time (ACUTE ONLY): 0955 SLP Time Calculation (min) (ACUTE ONLY): 30 min  Past Medical History:  Past Medical History:  Diagnosis Date   Anemia    Anxiety    Breast cancer (HCC) 01/2016   bilateral   CHF (congestive heart failure) (HCC)    Chronic kidney disease    Depression    Dialysis patient [facility])    DVT (deep venous thrombosis) (HCC)    left leg   DVT (deep venous thrombosis) (HCC) 1985   right thigh   Dysrhythmia    Gout    Headache    HTN (hypertension)    Hypertension    Nausea vomiting and diarrhea    Parathyroid  abnormality (HCC)    Parathyroid  disease (HCC)    Pneumonia 12/2015   Psoriasis    Renal insufficiency    Sickle cell trait (HCC)    traits   Past Surgical History:  Past Surgical History:  Procedure Laterality Date   ABDOMINAL HYSTERECTOMY  1980   APPENDECTOMY     BREAST BIOPSY Left 10/28/2013   benign   BREAST EXCISIONAL BIOPSY Left 2002   benign   CORONARY ANGIOGRAPHY N/A 04/04/2020   Procedure: CORONARY ANGIOGRAPHY;  Surgeon: Darron Deatrice LABOR, MD;  Location: [facility];  Service: Cardiovascular;  Laterality: N/A;   CYSTOSCOPY N/A 12/06/2021   Procedure: CYSTOSCOPY;  Surgeon: Twylla Glendia BROCKS, MD;  Location: [facility] ORS;  Service: Urology;  Laterality: N/A;   DIALYSIS/PERMA CATHETER INSERTION N/A 02/27/2022   Procedure: DIALYSIS/PERMA CATHETER INSERTION;  Surgeon: Marea Selinda RAMAN, MD;  Location: [facility];  Service: Cardiovascular;  Laterality: N/A;   INSERTION OF DIALYSIS CATHETER  2014   LIPOMA EXCISION N/A 01/23/2016   Procedure: EXCISION LIPOMA;  Surgeon: Dorothyann LITTIE Husk, MD;  Location: [facility] OR[facility];  Service: General;  Laterality: N/A;   MASTECTOMY W/ SENTINEL NODE BIOPSY Bilateral 01/23/2016   Procedure: bilateral MASTECTOMY WITH   bilateral SENTINEL LYMPH NODE BIOPSY possible left axillary node dissection forehead lipoma removal;  Surgeon: Dorothyann LITTIE Husk, MD;  Location: [facility] OR[facility];  Service: General;  Laterality: Bilateral;   PARTIAL HYSTERECTOMY     PERIPHERAL VASCULAR CATHETERIZATION N/A 12/25/2015   Procedure: Dialysis/Perma Catheter Insertion;  Surgeon: Selinda RAMAN Marea, MD;  Location: [facility];  Service: Cardiovascular;  Laterality: N/A;   PERIPHERAL VASCULAR CATHETERIZATION Left 01/22/2016   Procedure: Dialysis/Perma Catheter Insertion;  Surgeon: Selinda RAMAN Marea, MD;  Location: [facility];  Service: Cardiovascular;  Laterality: Left;   PERIPHERAL VASCULAR CATHETERIZATION N/A 01/26/2016   Procedure: Dialysis/Perma Catheter Insertion;  Surgeon: Cordella KANDICE Shawl, MD;  Location: [facility];  Service: Cardiovascular;  Laterality: N/A;   PORT-A-CATH REMOVAL N/A 12/20/2015   Procedure: REMOVAL PORT-A-CATH;  Surgeon: Dorothyann LITTIE Husk, MD;  Location: [facility];  Service: General;  Laterality: N/A;  left     PORTACATH PLACEMENT Left 08/21/2015   Procedure: INSERTION PORT-A-CATH;  Surgeon: Dorothyann LITTIE Husk, MD;  Location: [facility];  Service: General;  Laterality: Left;   REMOVAL OF A DIALYSIS CATHETER  2017   RENAL ANGIOGRAPHY Right 11/05/2021   Procedure: RENAL ANGIOGRAPHY;  Surgeon: Marea Selinda RAMAN, MD;  Location: [facility];  Service: Cardiovascular;  Laterality: Right;  with embolization   RIGHT HEART CATH N/A 04/04/2020   Procedure: RIGHT HEART CATH;  Surgeon: Darron Deatrice LABOR, MD;  Location: [facility];  Service: Cardiovascular;  Laterality: N/A;   URETEROSCOPY Bilateral 12/06/2021   Procedure: URETEROSCOPY;  Surgeon: Twylla Glendia BROCKS, MD;  Location: [facility];  Service: Urology;  Laterality: Bilateral;   HPI:  Per MD Progress Note: Teresa Cook is a 68 y.o. female with medical history significant for ESRD on HD, anemia secondary to ESRD, Anxiety, CHF, Depression, DVT right  thigh and left leg, dysrhythmia, hypertension, gout, parathyroid  disease, sickle cell trait.  She presented to [facility] ED on 07/13/2023 with complaints of chest pressure and shortness of breath.  He also complains of difficulty swallowing all consistencies of food. CT scan showed significant pneumonia in the right upper lobe consistent with aspiration.    Assessment / Plan / Recommendation  Clinical Impression  Pt seen for bedside swallow assessment in the setting of concern for aspiration PNA. Pt with known esophageal dysmotility per DG esophagus on 7/7. GI consult completed- with recommendation for follow up outpatient. Per chart review, pt reporting difficulty initiating swallow and taking pills.   Today, pt alert, on room air, with recent completion of regular breakfast tray without reported issue. Pt reports significant improvement in emesis (none since admission) and ability to take medication (they go right down). Trials completed of thin liquid without s/sx of aspiration and no indication of impaired swallow initiation. Timely completion and oral phase containment noted independently.   Based on grossly functional oropharyngeal swallow function observed and reported- emphasis of session shifted to education for compensatory strategies. Esophageal precautions introduced, including slow rate of intake, smaller more frequent meals, follow bites of food with liquids, remain upright for 30-60 minutes after eating, and do not eat/drink 2-3 hours before bedtime except water , elevate head of bed at least 30 degrees. Pt endorsed application of small meals, use of moist foods, monitoring endurance, and remaining upright.   No recommended diet change- continue with unrestricted options with pt self selecting soft/moist items. Pt is at increased risk for aspiration based on esophageal factors (reflux, dysmotility) and acute deconditioning. However, factors are managed with application of stated precautions. MD  and RN aware of recommendations. No further SLP services indicated at this time.   SLP Visit Diagnosis: Dysphagia, unspecified (R13.10) (suspect esophageal component)    Aspiration Risk  Mild aspiration risk    Diet Recommendation   Thin;Age appropriate regular  Medication Administration: Whole meds with liquid    Other  Recommendations Recommended Consults:  (GI follow up outpatient (per GI note)) Oral Care Recommendations: Oral care BID;Patient independent with oral care     Assistance Recommended at Discharge    Functional Status Assessment Patient has not had a recent decline in their functional status (oropharyngeal swallow appears grossly intact)    Swallow Study   General Date of Onset: 07/17/23 HPI: Per MD Progress Note: Teresa Cook is a 68 y.o. female with medical history significant for ESRD on HD, anemia secondary to ESRD, Anxiety, CHF, Depression, DVT right thigh and left leg, dysrhythmia, hypertension, gout, parathyroid  disease, sickle cell trait.  She presented to [facility] ED on 07/13/2023 with complaints of chest pressure and shortness of breath.  He also complains of difficulty swallowing all consistencies of food. CT scan showed significant pneumonia in the right upper lobe consistent with aspiration. Type of Study: Bedside Swallow Evaluation Previous Swallow Assessment: none in chart Diet Prior to this Study: Regular;Thin liquids (Level 0) Temperature Spikes Noted: No Respiratory Status: Room air History of Recent Intubation: No  Behavior/Cognition: Alert;Cooperative;Pleasant mood Oral Cavity Assessment: Within Functional Limits Oral Care Completed by SLP: Recent completion by staff Oral Cavity - Dentition: Adequate natural dentition Vision: Functional for self-feeding Self-Feeding Abilities: Able to feed self Patient Positioning: Upright in bed Baseline Vocal Quality: Normal Volitional Cough: Strong Volitional Swallow: Able to elicit    Oral/Motor/Sensory  Function Overall Oral Motor/Sensory Function: Within functional limits   Ice Chips Ice chips: Not tested   Thin Liquid Thin Liquid: Within functional limits Presentation: Straw;Self Fed    Nectar Thick Nectar Thick Liquid: Not tested   Honey Thick Honey Thick Liquid: Not tested   Puree Puree: Not tested   Solid     Solid: Not tested Other Comments: pt recently completing breakfast without issue     [place] Bronsen Serano Clapp, MS, CCC-SLP Speech Language Pathologist Rehab Services; [facility] [phone] (ascom)   [place] J Clapp 07/17/2023,10:43 AM

## 2023-07-17 NOTE — Plan of Care (Signed)
  Problem: Education: Goal: Ability to describe self-care measures that may prevent or decrease complications (Diabetes Survival Skills Education) will improve Outcome: Progressing   Problem: Coping: Goal: Ability to adjust to condition or change in health will improve Outcome: Progressing   Problem: Fluid Volume: Goal: Ability to maintain a balanced intake and output will improve Outcome: Progressing   Problem: Health Behavior/Discharge Planning: Goal: Ability to identify and utilize available resources and services will improve Outcome: Progressing Goal: Ability to manage health-related needs will improve Outcome: Progressing   Problem: Nutritional: Goal: Maintenance of adequate nutrition will improve Outcome: Progressing   Problem: Skin Integrity: Goal: Risk for impaired skin integrity will decrease Outcome: Progressing   Problem: Elimination: Goal: Will not experience complications related to bowel motility Outcome: Progressing   Problem: Pain Managment: Goal: General experience of comfort will improve and/or be controlled Outcome: Progressing

## 2023-07-17 NOTE — Progress Notes (Signed)
 [facility]  ROUNDING NOTE   Subjective:   [name] was admitted to [facility] on 07/13/2023 for Renal mass [N28.89] Acute respiratory failure with hypoxia (HCC) [J96.01] Pneumonia of right lung due to infectious organism, unspecified part of lung [J18.9] Acute hypoxic respiratory failure (HCC) [J96.01]  Last hemodialysis treatment was Friday. Patient received 2:45 minutes of prescribed dialysis treatment. Patient is 2 kg above her dry weight.   Update Patient seen resting quietly No family present Alert and oriented Remains on room air  Objective:  Vital signs in last 24 hours:  Temp:  [97.1 F (36.2 C)-98.6 F (37 C)] 98.1 F (36.7 C) (07/10 1133) Pulse Rate:  [70-84] 79 (07/10 1133) Resp:  [8-25] 19 (07/10 1133) BP: (119-164)/(64-90) 127/83 (07/10 1133) SpO2:  [88 %-100 %] 99 % (07/10 1133) Weight:  [54.6 kg] 54.6 kg (07/10 0500)  Weight change: 0.6 kg Filed Weights   07/16/23 0503 07/16/23 1314 07/17/23 0500  Weight: 54 kg 54.6 kg 54.6 kg    Intake/Output: I/O last 3 completed shifts: In: 580 [P.O.:580] Out: 1 [Other:1]   Intake/Output this shift:  Total I/O In: 120 [P.O.:120] Out: -   Physical Exam: General: NAD  Head: Normocephalic, atraumatic. Moist oral mucosal membranes  Eyes: Anicteric  Lungs:  Clear to auscultation, room air  Heart: Regular rate and rhythm  Abdomen:  Soft, nontender  Extremities:  no peripheral edema.  Neurologic: Alert, awake  Skin: No lesions  Access: Right femoral permcath    Basic Metabolic Panel: Recent Labs  Lab 07/13/23 0645 07/15/23 1614 07/16/23 1522  NA 136 136 133*  K 3.7 3.6 4.7  CL 99 97* 97*  CO2 21* 26 24  GLUCOSE 103* 138* 105*  BUN 29* 23 34*  CREATININE 7.62* 5.37* 6.98*  CALCIUM  10.6* 9.9 10.0  PHOS  --   --  4.5    Liver Function Tests: Recent Labs  Lab 07/13/23 0645 07/16/23 1522  AST 26  --   ALT 25  --   ALKPHOS 76  --   BILITOT 0.9  --   PROT 7.1  --   ALBUMIN  3.5  3.0*   Recent Labs  Lab 07/13/23 0645  LIPASE 25   No results for input(s): AMMONIA in the last 168 hours.  CBC: Recent Labs  Lab 07/13/23 0645 07/15/23 1614 07/15/23 2002 07/16/23 0516 07/17/23 0558  WBC 11.7* 5.7  --  7.2 6.1  NEUTROABS 9.1*  --   --   --   --   HGB 10.3* 7.4* 7.9* 7.9* 7.4*  HCT 30.4* 22.3* 23.5* 23.3* 22.0*  MCV 85.2 87.5  --  87.3 86.3  PLT 148* 148*  --  168 169    Cardiac Enzymes: No results for input(s): CKTOTAL, CKMB, CKMBINDEX, TROPONINI in the last 168 hours.  BNP: Invalid input(s): POCBNP  CBG: Recent Labs  Lab 07/15/23 2216 07/16/23 0802 07/16/23 1223 07/17/23 0746 07/17/23 1151  GLUCAP 127* 102* 121* 87 104*    Microbiology: Results for orders placed or performed during the hospital encounter of 07/13/23  Resp panel by RT-PCR (RSV, Flu A&B, Covid) Anterior Nasal Swab     Status: None   Collection Time: 07/13/23  6:45 AM   Specimen: Anterior Nasal Swab  Result Value Ref Range Status   SARS Coronavirus 2 by RT PCR NEGATIVE NEGATIVE Final    Comment: (NOTE) SARS-CoV-2 target nucleic acids are NOT DETECTED.  The SARS-CoV-2 RNA is generally detectable in upper respiratory specimens during the  acute phase of infection. The lowest concentration of SARS-CoV-2 viral copies this assay can detect is 138 copies/mL. A negative result does not preclude SARS-Cov-2 infection and should not be used as the sole basis for treatment or other patient management decisions. A negative result may occur with  improper specimen collection/handling, submission of specimen other than nasopharyngeal swab, presence of viral mutation(s) within the areas targeted by this assay, and inadequate number of viral copies(<138 copies/mL). A negative result must be combined with clinical observations, patient history, and epidemiological information. The expected result is Negative.  Fact Sheet for Patients:   BloggerCourse.com  Fact Sheet for Healthcare Providers:  SeriousBroker.it  This test is no t yet approved or cleared by the [place]  FDA and  has been authorized for detection and/or diagnosis of SARS-CoV-2 by FDA under an Emergency Use Authorization (EUA). This EUA will remain  in effect (meaning this test can be used) for the duration of the COVID-19 declaration under Section 564(b)(1) of the Act, 21 U.S.C.section 360bbb-3(b)(1), unless the authorization is terminated  or revoked sooner.       Influenza A by PCR NEGATIVE NEGATIVE Final   Influenza B by PCR NEGATIVE NEGATIVE Final    Comment: (NOTE) The Xpert Xpress SARS-CoV-2/FLU/RSV plus assay is intended as an aid in the diagnosis of influenza from Nasopharyngeal swab specimens and should not be used as a sole basis for treatment. Nasal washings and aspirates are unacceptable for Xpert Xpress SARS-CoV-2/FLU/RSV testing.  Fact Sheet for Patients: BloggerCourse.com  Fact Sheet for Healthcare Providers: SeriousBroker.it  This test is not yet approved or cleared by the [place]  FDA and has been authorized for detection and/or diagnosis of SARS-CoV-2 by FDA under an Emergency Use Authorization (EUA). This EUA will remain in effect (meaning this test can be used) for the duration of the COVID-19 declaration under Section 564(b)(1) of the Act, 21 U.S.C. section 360bbb-3(b)(1), unless the authorization is terminated or revoked.     Resp Syncytial Virus by PCR NEGATIVE NEGATIVE Final    Comment: (NOTE) Fact Sheet for Patients: BloggerCourse.com  Fact Sheet for Healthcare Providers: SeriousBroker.it  This test is not yet approved or cleared by the [place]  FDA and has been authorized for detection and/or diagnosis of SARS-CoV-2 by FDA under an Emergency Use  Authorization (EUA). This EUA will remain in effect (meaning this test can be used) for the duration of the COVID-19 declaration under Section 564(b)(1) of the Act, 21 U.S.C. section 360bbb-3(b)(1), unless the authorization is terminated or revoked.  Performed at [facility], [address] [place], [place] [address]   Blood culture (routine x 2)     Status: None (Preliminary result)   Collection Time: 07/13/23  9:30 AM   Specimen: BLOOD  Result Value Ref Range Status   Specimen Description BLOOD BLOOD LEFT FOREARM  Final   Special Requests   Final    BOTTLES DRAWN AEROBIC AND ANAEROBIC Blood Culture results may not be optimal due to an inadequate volume of blood received in culture bottles   Culture   Final    NO GROWTH 3 DAYS Performed at [facility], [address] [place], [place] [address]    Report Status PENDING  Incomplete  Blood culture (routine x 2)     Status: None (Preliminary result)   Collection Time: 07/13/23  9:40 AM   Specimen: BLOOD  Result Value Ref Range Status   Specimen Description BLOOD BLOOD RIGHT HAND  Final   Special Requests  Final    BOTTLES DRAWN AEROBIC AND ANAEROBIC Blood Culture results may not be optimal due to an inadequate volume of blood received in culture bottles   Culture   Final    NO GROWTH 3 DAYS Performed at [facility], [address] [place], [place] [address]    Report Status PENDING  Incomplete  MRSA Next Gen by PCR, Nasal     Status: None   Collection Time: 07/14/23  5:30 AM   Specimen: Nasal Mucosa; Nasal Swab  Result Value Ref Range Status   MRSA by PCR Next Gen NOT DETECTED NOT DETECTED Final    Comment: (NOTE) The GeneXpert MRSA Assay (FDA approved for NASAL specimens only), is one component of a comprehensive MRSA colonization surveillance program. It is not intended to diagnose MRSA infection nor to guide or monitor treatment for MRSA infections. Test performance is not FDA approved  in patients less than 13 years old. Performed at [facility], [address] [place], [place] [address]     Coagulation Studies: No results for input(s): LABPROT, INR in the last 72 hours.  Urinalysis: No results for input(s): COLORURINE, LABSPEC, PHURINE, GLUCOSEU, HGBUR, BILIRUBINUR, KETONESUR, PROTEINUR, UROBILINOGEN, NITRITE, LEUKOCYTESUR in the last 72 hours.  Invalid input(s): APPERANCEUR    Imaging: No results found.    Medications:      sodium chloride    Intravenous Once   sodium chloride    Intravenous Once   amiodarone   200 mg Oral Daily   carvedilol   12.5 mg Oral BID   Chlorhexidine  Gluconate Cloth  6 each Topical Q0600   feeding supplement  237 mL Oral BID BM   insulin  aspart  0-6 Units Subcutaneous TID WC   multivitamin  1 tablet Oral Daily   pantoprazole   40 mg Oral BID AC   polyethylene glycol  17 g Oral Daily   sacubitril -valsartan   1 tablet Oral BID   senna-docusate  2 tablet Oral BID   sertraline   150 mg Oral Daily   traZODone   50 mg Oral QHS   zolpidem   5 mg Oral QHS   acetaminophen , calcium  carbonate  Assessment/ Plan:  [name] is a 68 y.o.  female with end stage renal disease on hemodialysis, hypertension, hyperlipidemia, congestive heart failure, atrial fibrillation, gout, and DVT who is admitted to [facility] on 07/13/2023 for Renal mass [N28.89] Acute respiratory failure with hypoxia (HCC) [J96.01] Pneumonia of right lung due to infectious organism, unspecified part of lung [J18.9] Acute hypoxic respiratory failure (HCC) [J96.01]  [facility] Right femoral catheter 55kg.   End Stage Renal Disease: 2kg above target weight and acute pulmonary failure.  - Patient received dialysis yesterday, UF 1 L achieved. - Next treatment scheduled for Friday.  - Will adjust dry weight to 54.5 kg. - Will defer discharge plan to primary team.  Acute Respiratory failure: requiring [place] O2. With  pulmonary edema and right upper lobe pneumonia.  - empiric antibiotics, cefepime  -Room air  Hypertension with chronic kidney disease:  - home regimen of isosorbide  mononitrate, Entresto , and carvedilol  - Blood pressure 132/80  Secondary Hyperparathyroidism with hypercalcemia.  - receives etelcalcetide as outpatient.  - sevelamer  with meals.  -Calcium  and phosphorus within desired range for renal patient.  Renal mass: right. Consistent with RCC. Incidental finding.  - Consult urology for outpatient follow up.  - hold esa.     LOS: 4 Teresa Cook 7/10/20251:27 PM

## 2023-07-18 DIAGNOSIS — I5043 Acute on chronic combined systolic (congestive) and diastolic (congestive) heart failure: Secondary | ICD-10-CM | POA: Diagnosis not present

## 2023-07-18 DIAGNOSIS — Z992 Dependence on renal dialysis: Secondary | ICD-10-CM | POA: Diagnosis not present

## 2023-07-18 DIAGNOSIS — J9601 Acute respiratory failure with hypoxia: Secondary | ICD-10-CM | POA: Diagnosis not present

## 2023-07-18 DIAGNOSIS — N186 End stage renal disease: Secondary | ICD-10-CM | POA: Diagnosis not present

## 2023-07-18 LAB — RENAL FUNCTION PANEL
Albumin: 2.7 g/dL — ABNORMAL LOW (ref 3.5–5.0)
Anion gap: 13 (ref 5–15)
BUN: 28 mg/dL — ABNORMAL HIGH (ref 8–23)
CO2: 26 mmol/L (ref 22–32)
Calcium: 10.2 mg/dL (ref 8.9–10.3)
Chloride: 96 mmol/L — ABNORMAL LOW (ref 98–111)
Creatinine, Ser: 6.5 mg/dL — ABNORMAL HIGH (ref 0.44–1.00)
GFR, Estimated: 6 mL/min — ABNORMAL LOW (ref >60)
Glucose, Bld: 121 mg/dL — ABNORMAL HIGH (ref 70–99)
Phosphorus: 3.4 mg/dL (ref 2.5–4.6)
Potassium: 3.8 mmol/L (ref 3.5–5.1)
Sodium: 135 mmol/L (ref 135–145)

## 2023-07-18 LAB — GLUCOSE, CAPILLARY
Glucose-Capillary: 98 mg/dL (ref 70–99)
Glucose-Capillary: 119 mg/dL — ABNORMAL HIGH (ref 70–99)
Glucose-Capillary: 100 mg/dL — ABNORMAL HIGH (ref 70–99)
Glucose-Capillary: 135 mg/dL — ABNORMAL HIGH (ref 70–99)

## 2023-07-18 LAB — CULTURE, BLOOD (ROUTINE X 2)
Culture: NO GROWTH
Culture: NO GROWTH

## 2023-07-18 LAB — CBC
HCT: 25.5 % — ABNORMAL LOW (ref 36.0–46.0)
Hemoglobin: 8.7 g/dL — ABNORMAL LOW (ref 12.0–15.0)
MCH: 29.1 pg (ref 26.0–34.0)
MCHC: 34.1 g/dL (ref 30.0–36.0)
MCV: 85.3 fL (ref 80.0–100.0)
Platelets: 169 K/uL (ref 150–400)
RBC: 2.99 MIL/uL — ABNORMAL LOW (ref 3.87–5.11)
RDW: 15.4 % (ref 11.5–15.5)
WBC: 6.6 K/uL (ref 4.0–10.5)
nRBC: 0 % (ref 0.0–0.2)

## 2023-07-18 LAB — VITAMIN B12: Vitamin B-12: 911 pg/mL (ref 180–914)

## 2023-07-18 MED ORDER — BUTALBITAL-APAP-CAFFEINE 50-325-40 MG PO TABS
1.0000 | ORAL_TABLET | Freq: Four times a day (QID) | ORAL | Status: DC | PRN
  Administered 2023-07-18: 1 via ORAL
  Filled 2023-07-18: qty 1

## 2023-07-18 MED ORDER — HYDROCODONE-ACETAMINOPHEN 5-325 MG PO TABS
1.0000 | ORAL_TABLET | Freq: Once | ORAL | Status: AC
  Administered 2023-07-18: 1 via ORAL
  Filled 2023-07-18 (×2): qty 1

## 2023-07-18 NOTE — Progress Notes (Signed)
 [facility]  ROUNDING NOTE   Subjective:   [name] was admitted to [facility] on 07/13/2023 for Renal mass [N28.89] Acute respiratory failure with hypoxia (HCC) [J96.01] Pneumonia of right lung due to infectious organism, unspecified part of lung [J18.9] Acute hypoxic respiratory failure (HCC) [J96.01]  Last hemodialysis treatment was Friday. Patient received 2:45 minutes of prescribed dialysis treatment. Patient is 2 kg above her dry weight.   Update Patient seen laying in bed Alert States she felt bad yesterday and preferred to stay another night.   Scheduled for dialysis later today  Objective:  Vital signs in last 24 hours:  Temp:  [98 F (36.7 C)-98.9 F (37.2 C)] 98.2 F (36.8 C) (07/11 1249) Pulse Rate:  [71-81] 81 (07/11 1430) Resp:  [17-25] 25 (07/11 1430) BP: (122-169)/(67-115) 151/96 (07/11 1430) SpO2:  [90 %-100 %] 98 % (07/11 1430) Weight:  [54.9 kg-58.1 kg] 54.9 kg (07/11 1300)  Weight change: 3.5 kg Filed Weights   07/17/23 0500 07/18/23 0535 07/18/23 1300  Weight: 54.6 kg 58.1 kg 54.9 kg    Intake/Output: I/O last 3 completed shifts: In: 635 [P.O.:320; Blood:315] Out: -    Intake/Output this shift:  No intake/output data recorded.  Physical Exam: General: NAD  Head: Normocephalic, atraumatic. Moist oral mucosal membranes  Eyes: Anicteric  Lungs:  Clear to auscultation, room air  Heart: Regular rate and rhythm  Abdomen:  Soft, nontender  Extremities:  no peripheral edema.  Neurologic: Alert, awake  Skin: No lesions  Access: Right femoral permcath    Basic Metabolic Panel: Recent Labs  Lab 07/13/23 0645 07/15/23 1614 07/16/23 1522 07/18/23 1115  NA 136 136 133* 135  K 3.7 3.6 4.7 3.8  CL 99 97* 97* 96*  CO2 21* 26 24 26   GLUCOSE 103* 138* 105* 121*  BUN 29* 23 34* 28*  CREATININE 7.62* 5.37* 6.98* 6.50*  CALCIUM  10.6* 9.9 10.0 10.2  PHOS  --   --  4.5 3.4    Liver Function Tests: Recent Labs  Lab  07/13/23 0645 07/16/23 1522 07/18/23 1115  AST 26  --   --   ALT 25  --   --   ALKPHOS 76  --   --   BILITOT 0.9  --   --   PROT 7.1  --   --   ALBUMIN  3.5 3.0* 2.7*   Recent Labs  Lab 07/13/23 0645  LIPASE 25   No results for input(s): AMMONIA in the last 168 hours.  CBC: Recent Labs  Lab 07/13/23 0645 07/15/23 1614 07/15/23 2002 07/16/23 0516 07/17/23 0558 07/18/23 0540  WBC 11.7* 5.7  --  7.2 6.1 6.6  NEUTROABS 9.1*  --   --   --   --   --   HGB 10.3* 7.4* 7.9* 7.9* 7.4* 8.7*  HCT 30.4* 22.3* 23.5* 23.3* 22.0* 25.5*  MCV 85.2 87.5  --  87.3 86.3 85.3  PLT 148* 148*  --  168 169 169    Cardiac Enzymes: No results for input(s): CKTOTAL, CKMB, CKMBINDEX, TROPONINI in the last 168 hours.  BNP: Invalid input(s): POCBNP  CBG: Recent Labs  Lab 07/17/23 1151 07/17/23 1558 07/17/23 2115 07/18/23 0732 07/18/23 1158  GLUCAP 104* 113* 108* 98 119*    Microbiology: Results for orders placed or performed during the hospital encounter of 07/13/23  Resp panel by RT-PCR (RSV, Flu A&B, Covid) Anterior Nasal Swab     Status: None   Collection Time: 07/13/23  6:45 AM  Specimen: Anterior Nasal Swab  Result Value Ref Range Status   SARS Coronavirus 2 by RT PCR NEGATIVE NEGATIVE Final    Comment: (NOTE) SARS-CoV-2 target nucleic acids are NOT DETECTED.  The SARS-CoV-2 RNA is generally detectable in upper respiratory specimens during the acute phase of infection. The lowest concentration of SARS-CoV-2 viral copies this assay can detect is 138 copies/mL. A negative result does not preclude SARS-Cov-2 infection and should not be used as the sole basis for treatment or other patient management decisions. A negative result may occur with  improper specimen collection/handling, submission of specimen other than nasopharyngeal swab, presence of viral mutation(s) within the areas targeted by this assay, and inadequate number of viral copies(<138 copies/mL). A  negative result must be combined with clinical observations, patient history, and epidemiological information. The expected result is Negative.  Fact Sheet for Patients:  BloggerCourse.com  Fact Sheet for Healthcare Providers:  SeriousBroker.it  This test is no t yet approved or cleared by the [place]  FDA and  has been authorized for detection and/or diagnosis of SARS-CoV-2 by FDA under an Emergency Use Authorization (EUA). This EUA will remain  in effect (meaning this test can be used) for the duration of the COVID-19 declaration under Section 564(b)(1) of the Act, 21 U.S.C.section 360bbb-3(b)(1), unless the authorization is terminated  or revoked sooner.       Influenza A by PCR NEGATIVE NEGATIVE Final   Influenza B by PCR NEGATIVE NEGATIVE Final    Comment: (NOTE) The Xpert Xpress SARS-CoV-2/FLU/RSV plus assay is intended as an aid in the diagnosis of influenza from Nasopharyngeal swab specimens and should not be used as a sole basis for treatment. Nasal washings and aspirates are unacceptable for Xpert Xpress SARS-CoV-2/FLU/RSV testing.  Fact Sheet for Patients: BloggerCourse.com  Fact Sheet for Healthcare Providers: SeriousBroker.it  This test is not yet approved or cleared by the [place]  FDA and has been authorized for detection and/or diagnosis of SARS-CoV-2 by FDA under an Emergency Use Authorization (EUA). This EUA will remain in effect (meaning this test can be used) for the duration of the COVID-19 declaration under Section 564(b)(1) of the Act, 21 U.S.C. section 360bbb-3(b)(1), unless the authorization is terminated or revoked.     Resp Syncytial Virus by PCR NEGATIVE NEGATIVE Final    Comment: (NOTE) Fact Sheet for Patients: BloggerCourse.com  Fact Sheet for Healthcare  Providers: SeriousBroker.it  This test is not yet approved or cleared by the [place]  FDA and has been authorized for detection and/or diagnosis of SARS-CoV-2 by FDA under an Emergency Use Authorization (EUA). This EUA will remain in effect (meaning this test can be used) for the duration of the COVID-19 declaration under Section 564(b)(1) of the Act, 21 U.S.C. section 360bbb-3(b)(1), unless the authorization is terminated or revoked.  Performed at [facility], [address] [place], [place] [address]   Blood culture (routine x 2)     Status: None   Collection Time: 07/13/23  9:30 AM   Specimen: BLOOD  Result Value Ref Range Status   Specimen Description BLOOD BLOOD LEFT FOREARM  Final   Special Requests   Final    BOTTLES DRAWN AEROBIC AND ANAEROBIC Blood Culture results may not be optimal due to an inadequate volume of blood received in culture bottles   Culture   Final    NO GROWTH 5 DAYS Performed at [facility], [address], [place], [place] [address]    Report Status 07/18/2023 FINAL  Final  Blood culture (routine x 2)     Status: None   Collection Time: 07/13/23  9:40 AM   Specimen: BLOOD  Result Value Ref Range Status   Specimen Description BLOOD BLOOD RIGHT HAND  Final   Special Requests   Final    BOTTLES DRAWN AEROBIC AND ANAEROBIC Blood Culture results may not be optimal due to an inadequate volume of blood received in culture bottles   Culture   Final    NO GROWTH 5 DAYS Performed at [facility], [address] [place], [place] [address]    Report Status 07/18/2023 FINAL  Final  MRSA Next Gen by PCR, Nasal     Status: None   Collection Time: 07/14/23  5:30 AM   Specimen: Nasal Mucosa; Nasal Swab  Result Value Ref Range Status   MRSA by PCR Next Gen NOT DETECTED NOT DETECTED Final    Comment: (NOTE) The GeneXpert MRSA Assay (FDA approved for NASAL specimens only), is one component of a  comprehensive MRSA colonization surveillance program. It is not intended to diagnose MRSA infection nor to guide or monitor treatment for MRSA infections. Test performance is not FDA approved in patients less than 87 years old. Performed at [facility], [address] [place], [place] [address]     Coagulation Studies: No results for input(s): LABPROT, INR in the last 72 hours.  Urinalysis: No results for input(s): COLORURINE, LABSPEC, PHURINE, GLUCOSEU, HGBUR, BILIRUBINUR, KETONESUR, PROTEINUR, UROBILINOGEN, NITRITE, LEUKOCYTESUR in the last 72 hours.  Invalid input(s): APPERANCEUR    Imaging: No results found.    Medications:      sodium chloride    Intravenous Once   amiodarone   200 mg Oral Daily   carvedilol   12.5 mg Oral BID   Chlorhexidine  Gluconate Cloth  6 each Topical Q0600   feeding supplement  237 mL Oral BID BM   insulin  aspart  0-6 Units Subcutaneous TID WC   multivitamin  1 tablet Oral Daily   pantoprazole   40 mg Oral BID AC   polyethylene glycol  17 g Oral Daily   sacubitril -valsartan   1 tablet Oral BID   senna-docusate  2 tablet Oral BID   sertraline   150 mg Oral Daily   traZODone   50 mg Oral QHS   zolpidem   5 mg Oral QHS   acetaminophen , calcium  carbonate  Assessment/ Plan:  [name] is a 68 y.o.  female with end stage renal disease on hemodialysis, hypertension, hyperlipidemia, congestive heart failure, atrial fibrillation, gout, and DVT who is admitted to [facility] on 07/13/2023 for Renal mass [N28.89] Acute respiratory failure with hypoxia (HCC) [J96.01] Pneumonia of right lung due to infectious organism, unspecified part of lung [J18.9] Acute hypoxic respiratory failure (HCC) [J96.01]  [facility] Right femoral catheter 55kg.   End Stage Renal Disease: 2kg above target weight and acute pulmonary failure.  - Scheduled for dialysis later today. - Next treatment scheduled for  Monday.  - Will adjust dry weight to 54.5 kg. - Will defer discharge plan to primary team.  Acute Respiratory failure: requiring [place] O2. With pulmonary edema and right upper lobe pneumonia.  - empiric antibiotics, cefepime  -Room air  Hypertension with chronic kidney disease:  - home regimen of isosorbide  mononitrate, Entresto , and carvedilol  - Continue carvedilol  and entresto   Secondary Hyperparathyroidism with hypercalcemia.  - receives etelcalcetide as outpatient.  - sevelamer  with meals.  -Calcium  and phosphorus at goal  Renal mass: right. Consistent with RCC. Incidental finding.  -  Consult urology for outpatient follow up.  - hold esa.     LOS: 5 Abram Sax 7/11/20253:12 PM

## 2023-07-18 NOTE — Progress Notes (Signed)
 Progress Note   Patient: Dellanira Dillow FMW:[id number] DOB: 12-22-1955 DOA: 07/13/2023     5 DOS: the patient was seen and examined on 07/18/2023   Brief hospital course: Karmella Bouvier is a 68 y.o. female with medical history significant for ESRD on HD, anemia secondary to ESRD, Anxiety, CHF, Depression, DVT right thigh and left leg, dysrhythmia, hypertension, gout, parathyroid  disease, sickle cell trait.  She presented to [facility] ED on 07/13/2023 with complaints of chest pressure and shortness of breath.  He also complains of difficulty swallowing all consistencies of food.   Ct showed: Negative examination for pulmonary embolism. 2. Moderate bilateral pleural effusions, diffuse interlobular septal thickening consistent with pulmonary edema. 3. Extensive heterogeneous and ground-glass airspace disease throughout the right upper lobe, consistent with superimposed infection or aspiration. 4. Cardiomegaly and coronary artery disease. 5. Enlargement of the tubular ascending thoracic aorta measuring up to 4.6 x 4.5 cm. Ascending thoracic aortic aneurysm.  6. Severely atrophic cystic kidneys, consistent with chronic renal disease and dialysis. 7. Enhancing mass arising from the inferior pole of the right kidney measuring 2.5 x 2.4 cm, consistent with renal cell carcinoma.   Principal Problem:   Acute respiratory failure with hypoxia (HCC) Active Problems:   Acute on chronic combined systolic and diastolic CHF (congestive heart failure) (HCC)   Hypertensive emergency   ESRD on dialysis (HCC)   Parathyroid  disease (HCC)   Hypercalcemia   Depression with anxiety   Acute pulmonary edema (HCC)   Chest pain   Pneumonia of right lung due to infectious organism   Paroxysmal atrial fibrillation (HCC)   Dysphagia   Acute HFrEF (heart failure with reduced ejection fraction) (HCC)   Renal mass   Assessment and Plan: Acute respiratory failure with hypoxemia. Multifactorial, secondary to aspiration  pneumonia and pulmonary edema secondary to acute congestive heart failure, pleural effusion. Patient had a significant respiratory distress with ambulation, she was requiring BiPAP.   Condition has improved, patient has been off oxygen.  Improved.   Acute on chronic systolic and diastolic CHF,  moderate bilateral pleural effusions and associated atelectasis: End-stage renal disease on dialysis. Elevated troponin secondary to demand ischemia. Repeat 2D echo on 07/15/2023 showed EF estimated at 35 to 40%, grade 2 diastolic dysfunction, moderately dilated left atrium, moderate MR. Patient is a followed by cardiology as well as nephrology.  Removed fluids through dialysis.   Dialyzed again today.   Right upper lobe aspiration pneumonia. Dysphagia. CT scan showed significant pneumonia in the right upper lobe consistent with aspiration.  She had significant dysphagia, high risk for aspiration.  Patient has been seen by GI, no plan for workup.   Completed course of antibiotics. Seen by speech therapist, no additional risk of aspiration   Hypertensive emergency:  Blood pressure better after dialysis.  Continue to follow   Acute on chronic anemia: Anemia of end-stage renal disease.   So far has received 2 units of PRBC, hemoglobin has improved to 8.7 today.  Iron normal, B12 level still pending.     Bilateral renal masses concerning for renal cell carcinoma,  has been evaluated by urologist.  Outpatient MRI recommended for further evaluation.  She has also been seen by the oncologist, Dr. Jacobo, who will follow the patient in the outpatient setting.     Ascending thoracic aortic aneurysm 4.6 x 4.5 cm: Recommend semi-annual imaging followup by CTA or MRA and referral to cardiothoracic surgery as an outpatient   History of DVT. Paroxysmal atrial fibrillation. Eliquis  on hold due  to low hemoglobin.         Subjective:  Patient states that she does not feel well, does not want to go  home.  She states that her heart rate was a fast and slow at times, but telemetry has been stable.  Physical Exam: Vitals:   07/18/23 1315 07/18/23 1330 07/18/23 1400 07/18/23 1430  BP: (!) 149/115 (!) 165/95 (!) 169/96 (!) 151/96  Pulse: 71 71 72 81  Resp: (!) 21 (!) 21 (!) 22 (!) 25  Temp:      TempSrc:      SpO2: 100% 97% 100% 98%  Weight:      Height:       General exam: Appears calm and comfortable  Respiratory system: Clear to auscultation. Respiratory effort normal. Cardiovascular system: S1 & S2 heard, RRR. No JVD, murmurs, rubs, gallops or clicks. No pedal edema. Gastrointestinal system: Abdomen is nondistended, soft and nontender. No organomegaly or masses felt. Normal bowel sounds heard. Central nervous system: Alert and oriented. No focal neurological deficits. Extremities: Symmetric 5 x 5 power. Skin: No rashes, lesions or ulcers Psychiatry: Judgement and insight appear normal. Mood & affect appropriate.    Data Reviewed:  Results reviewed  Family Communication: Daughter updated over the phone.  Disposition: Status is: Inpatient Remains inpatient appropriate because: Severity of disease     Time spent: 35 minutes  Author: Murvin Mana, MD 07/18/2023 3:16 PM  For on call review [email or web address].

## 2023-07-18 NOTE — Plan of Care (Signed)
  Problem: Education: Goal: Ability to describe self-care measures that may prevent or decrease complications (Diabetes Survival Skills Education) will improve Outcome: Progressing   Problem: Coping: Goal: Ability to adjust to condition or change in health will improve Outcome: Progressing   Problem: Fluid Volume: Goal: Ability to maintain a balanced intake and output will improve Outcome: Progressing   Problem: Metabolic: Goal: Ability to maintain appropriate glucose levels will improve Outcome: Progressing   Problem: Skin Integrity: Goal: Risk for impaired skin integrity will decrease Outcome: Progressing   Problem: Tissue Perfusion: Goal: Adequacy of tissue perfusion will improve Outcome: Progressing   Problem: Cardiac: Goal: Ability to achieve and maintain adequate cardiopulmonary perfusion will improve Outcome: Progressing   Problem: Clinical Measurements: Goal: Will remain free from infection Outcome: Progressing   Problem: Clinical Measurements: Goal: Diagnostic test results will improve Outcome: Progressing   Problem: Safety: Goal: Ability to remain free from injury will improve Outcome: Progressing   Plan of care, assessment, treatment, monitoring, and intervention (s) ongoing, see MAR see flowsheet

## 2023-07-18 NOTE — Plan of Care (Signed)

## 2023-07-18 NOTE — Progress Notes (Signed)
  Received patient in bed to unit.   Informed consent signed and in chart.    TX duration: 3.15     Transported back to floor  Hand-off given to patient's nurse. Pt still c/o 10/10 headache  even after pain meds.  Primary nurse notified    Access used: R HD Femoral cath  Access issues: none   Total UF removed: 0.5L Medication(s) given: Norco Post HD VS: wnl Post HD weight: 53.9kg     Olivia Hurst LPN [facility]

## 2023-07-19 DIAGNOSIS — N186 End stage renal disease: Secondary | ICD-10-CM | POA: Diagnosis not present

## 2023-07-19 DIAGNOSIS — I5043 Acute on chronic combined systolic (congestive) and diastolic (congestive) heart failure: Secondary | ICD-10-CM | POA: Diagnosis not present

## 2023-07-19 DIAGNOSIS — Z992 Dependence on renal dialysis: Secondary | ICD-10-CM | POA: Diagnosis not present

## 2023-07-19 DIAGNOSIS — J9601 Acute respiratory failure with hypoxia: Secondary | ICD-10-CM | POA: Diagnosis not present

## 2023-07-19 LAB — CBC
HCT: 25.6 % — ABNORMAL LOW (ref 36.0–46.0)
Hemoglobin: 8.7 g/dL — ABNORMAL LOW (ref 12.0–15.0)
MCH: 29.4 pg (ref 26.0–34.0)
MCHC: 34 g/dL (ref 30.0–36.0)
MCV: 86.5 fL (ref 80.0–100.0)
Platelets: 173 K/uL (ref 150–400)
RBC: 2.96 MIL/uL — ABNORMAL LOW (ref 3.87–5.11)
RDW: 15.5 % (ref 11.5–15.5)
WBC: 5.7 K/uL (ref 4.0–10.5)
nRBC: 0 % (ref 0.0–0.2)

## 2023-07-19 LAB — GLUCOSE, CAPILLARY
Glucose-Capillary: 82 mg/dL (ref 70–99)
Glucose-Capillary: 106 mg/dL — ABNORMAL HIGH (ref 70–99)

## 2023-07-19 NOTE — Discharge Summary (Signed)
 Physician Discharge Summary   Patient: Teresa Cook MRN: [id number] DOB: [birth year]  Admit date:     07/13/2023  Discharge date: 07/19/23  Discharge Physician: Murvin Mana   PCP: [facility]   Recommendations at discharge:   Follow-up with PCP in 1 week. Please refer to thoracic surgery for ascending aorta aneurysm. Follow-up with urology, Dr. Twylla in 2 weeks. Follow-up with Dr. Jacobo in 2 weeks.  Discharge Diagnoses: Principal Problem:   Acute respiratory failure with hypoxia (HCC) Active Problems:   Acute on chronic combined systolic and diastolic CHF (congestive heart failure) (HCC)   Hypertensive emergency   ESRD on dialysis (HCC)   Parathyroid  disease (HCC)   Hypercalcemia   Depression with anxiety   Acute pulmonary edema (HCC)   Chest pain   Pneumonia of right lung due to infectious organism   Paroxysmal atrial fibrillation (HCC)   Dysphagia   Acute HFrEF (heart failure with reduced ejection fraction) (HCC)   Renal mass  Resolved Problems:   * No resolved hospital problems. *  Hospital Course: Teresa Cook is a 68 y.o. female with medical history significant for ESRD on HD, anemia secondary to ESRD, Anxiety, CHF, Depression, DVT right thigh and left leg, dysrhythmia, hypertension, gout, parathyroid  disease, sickle cell trait.  She presented to [facility] ED on 07/13/2023 with complaints of chest pressure and shortness of breath.  He also complains of difficulty swallowing all consistencies of food.   Ct showed: Negative examination for pulmonary embolism. 2. Moderate bilateral pleural effusions, diffuse interlobular septal thickening consistent with pulmonary edema. 3. Extensive heterogeneous and ground-glass airspace disease throughout the right upper lobe, consistent with superimposed infection or aspiration. 4. Cardiomegaly and coronary artery disease. 5. Enlargement of the tubular ascending thoracic aorta measuring up to 4.6 x 4.5  cm. Ascending thoracic aortic aneurysm.  6. Severely atrophic cystic kidneys, consistent with chronic renal disease and dialysis. 7. Enhancing mass arising from the inferior pole of the right kidney measuring 2.5 x 2.4 cm, consistent with renal cell carcinoma.  Assessment and Plan: Acute respiratory failure with hypoxemia. Multifactorial, secondary to aspiration pneumonia and pulmonary edema secondary to acute congestive heart failure, pleural effusion. Patient had a significant respiratory distress with ambulation, she was requiring BiPAP.   Condition has improved, patient has been off oxygen.  Improved.   Acute on chronic systolic and diastolic CHF,  moderate bilateral pleural effusions and associated atelectasis: End-stage renal disease on dialysis. Elevated troponin secondary to demand ischemia. Repeat 2D echo on 07/15/2023 showed EF estimated at 35 to 40%, grade 2 diastolic dysfunction, moderately dilated left atrium, moderate MR. Patient is a followed by cardiology as well as nephrology.  Removed fluids through dialysis.   Volume status improved, on regular scheduled dialysis. Cardiology recommended hold Eliquis  due to drop in hemoglobin   Right upper lobe aspiration pneumonia. Dysphagia. CT scan showed significant pneumonia in the right upper lobe consistent with aspiration.  She had significant dysphagia, high risk for aspiration.  Patient has been seen by GI, no plan for workup.   Completed course of antibiotics. Seen by speech therapist, no additional risk of aspiration   Hypertensive emergency:  Blood pressure better after dialysis.  Resumed home treatment.   Acute on chronic anemia: Anemia of end-stage renal disease.   So far has received 2 units of PRBC, hemoglobin has improved to 8.7 today.  Iron normal, B12 level normal     Bilateral renal masses concerning for renal cell carcinoma,  has been evaluated  by urologist.  Outpatient MRI recommended for further evaluation.   She has also been seen by the oncologist, Dr. Jacobo, who will follow the patient in the outpatient setting.     Ascending thoracic aortic aneurysm 4.6 x 4.5 cm: Recommend semi-annual imaging followup by CTA or MRA and referral to cardiothoracic surgery as an outpatient   History of DVT. Paroxysmal atrial fibrillation. Eliquis  on hold due to low hemoglobin.                Consultants: Nephrology and cardiology. Procedures performed: HD  Disposition: Home Diet recommendation:  Discharge Diet Orders (From admission, onward)     Start     Ordered   07/19/23 0000  Diet general       Comments: Renal diet   07/19/23 0957           Renal diet DISCHARGE MEDICATION: Allergies as of 07/19/2023       Reactions   Amlodipine  Palpitations   Gabapentin  Other (See Comments)   Seizure   Hydrochlorothiazide    Other Reaction(s): gout   Atorvastatin     Other Reaction(s): myalgias   Lisinopril Cough   Adhesive [tape] Itching   Silk tape is ok to use.        Medication List     STOP taking these medications    apixaban  2.5 MG Tabs tablet Commonly known as: ELIQUIS    midodrine 10 MG tablet Commonly known as: PROAMATINE       TAKE these medications    amiodarone  200 MG tablet Commonly known as: PACERONE  Take 1 tablet (200 mg total) by mouth 2 (two) times daily.   b complex-vitamin c-folic acid  0.8 MG Tabs tablet Take 1 tablet by mouth daily.   carvedilol  12.5 MG tablet Commonly known as: COREG  Take 12.5 mg by mouth 2 (two) times daily.   Entresto  24-26 MG Generic drug: sacubitril -valsartan  Take 1 tablet by mouth 2 (two) times daily.   pantoprazole  40 MG tablet Commonly known as: PROTONIX  Take 1 tablet (40 mg total) by mouth 2 (two) times daily. What changed: when to take this   sertraline  100 MG tablet Commonly known as: ZOLOFT  Take 150 mg by mouth daily.   sevelamer  carbonate 800 MG tablet Commonly known as: RENVELA  Take 800 mg by mouth 3  (three) times daily with meals. Take 1 tablet with snack   traZODone  50 MG tablet Commonly known as: DESYREL  Take 50 mg by mouth at bedtime.   zolpidem  10 MG tablet Commonly known as: AMBIEN  Take 0.5 tablets (5 mg total) by mouth at bedtime. What changed: how much to take        Follow-up Information     [facility] Follow up in 1 week(s).   Specialty: General Practice Contact information: [address] [place] [address] [phone]         Twylla Glendia BROCKS, MD Follow up in 2 week(s).   Specialty: Urology Contact information: [address] [place] [address] [phone]                Discharge Exam: Fredricka Weights   07/18/23 1300 07/18/23 1654 07/19/23 0500  Weight: 54.9 kg 53.9 kg 58.1 kg   General exam: Appears calm and comfortable  Respiratory system: Clear to auscultation. Respiratory effort normal. Cardiovascular system: S1 & S2 heard, RRR. No JVD, murmurs, rubs, gallops or clicks. No pedal edema. Gastrointestinal system: Abdomen is nondistended, soft and nontender. No organomegaly or  masses felt. Normal bowel sounds heard. Central nervous system: Alert and oriented. No focal neurological deficits. Extremities: Symmetric 5 x 5 power. Skin: No rashes, lesions or ulcers Psychiatry: Judgement and insight appear normal. Mood & affect appropriate.    Condition at discharge: good  The results of significant diagnostics from this hospitalization (including imaging, microbiology, ancillary and laboratory) are listed below for reference.   Imaging Studies: ECHOCARDIOGRAM COMPLETE Result Date: 07/15/2023    ECHOCARDIOGRAM REPORT   Patient Name:   Lunetta Calixto Date of Exam: 07/15/2023 Medical Rec #:  [id number]       Height:       65.0 in Accession #:    [id number]      Weight:       118.4 lb Date of Birth:  [birth year]       BSA:          1.583 m Patient Age:    68 years        BP:            112/63 mmHg Patient Gender: F               HR:           68 bpm. Exam Location:  [facility] Procedure: 2D Echo, Cardiac Doppler and Color Doppler (Both Spectral and Color            Flow Doppler were utilized during procedure). Indications:     Dyspnea R06.00  History:         Patient has prior history of Echocardiogram examinations, most                  recent 11/07/2021. CHF; Risk Factors:Hypertension.  Sonographer:     Christopher Furnace Referring Phys:  [id number] AVA SWAYZE Diagnosing Phys: Deatrice Cage MD IMPRESSIONS  1. Left ventricular ejection fraction, by estimation, is 35 to 40%. The left ventricle has moderately decreased function. The left ventricle demonstrates global hypokinesis. There is mild left ventricular hypertrophy. Left ventricular diastolic parameters are consistent with Grade II diastolic dysfunction (pseudonormalization).  2. Right ventricular systolic function is normal. The right ventricular size is normal. Tricuspid regurgitation signal is inadequate for assessing PA pressure.  3. Left atrial size was moderately dilated.  4. 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 No evidence of mitral stenosis. Moderate mitral annular calcification.  5. The aortic valve is calcified. Aortic valve regurgitation is trivial. Aortic valve sclerosis/calcification is present, without any evidence of aortic stenosis.  6. The inferior vena cava is normal in size with greater than 50% respiratory variability, suggesting right atrial pressure of 3 mmHg. FINDINGS  Left Ventricle: Left ventricular ejection fraction, by estimation, is 35 to 40%. The left ventricle has moderately decreased function. The left ventricle demonstrates global hypokinesis. The left ventricular internal cavity size was normal in size. There is mild left ventricular hypertrophy. Left ventricular diastolic parameters are consistent with Grade II diastolic dysfunction (pseudonormalization). Right Ventricle: The right ventricular  size is normal. No increase in right ventricular wall thickness. Right ventricular systolic function is normal. Tricuspid regurgitation signal is inadequate for assessing PA pressure. The tricuspid regurgitant velocity is 1.82 m/s, and with an assumed right atrial pressure of 3 mmHg, the estimated right ventricular systolic pressure is 16.2 mmHg. Left Atrium: Left atrial size was moderately dilated. Right Atrium: Right atrial size was normal in size. Pericardium: There is no evidence of pericardial effusion. Mitral Valve: The mitral valve is myxomatous. There is moderate thickening  of the mitral valve leaflet(s). Moderate mitral annular calcification. Moderate mitral valve regurgitation. No evidence of mitral valve stenosis. MV peak gradient, 6.9 mmHg. The mean mitral valve gradient is 3.0 mmHg. Tricuspid Valve: The tricuspid valve is normal in structure. Tricuspid valve regurgitation is trivial. No evidence of tricuspid stenosis. Aortic Valve: The aortic valve is calcified. Aortic valve regurgitation is trivial. Aortic valve sclerosis/calcification is present, without any evidence of aortic stenosis. Aortic valve mean gradient measures 4.0 mmHg. Aortic valve peak gradient measures 5.9 mmHg. Aortic valve area, by VTI measures 2.04 cm. Pulmonic Valve: The pulmonic valve was normal in structure. Pulmonic valve regurgitation is not visualized. No evidence of pulmonic stenosis. Aorta: The aortic root is normal in size and structure. Venous: The inferior vena cava is normal in size with greater than 50% respiratory variability, suggesting right atrial pressure of 3 mmHg. IAS/Shunts: No atrial level shunt detected by color flow Doppler.  LEFT VENTRICLE PLAX 2D LVIDd:         3.65 cm   Diastology LVIDs:         2.42 cm   LV e' medial:    3.87 cm/s LV PW:         1.31 cm   LV E/e' medial:  28.9 LV IVS:        1.34 cm   LV e' lateral:   4.06 cm/s LVOT diam:     2.00 cm   LV E/e' lateral: 27.6 LV SV:         49 LV SV Index:    31 LVOT Area:     3.14 cm  RIGHT VENTRICLE RV Basal diam:  3.42 cm RV Mid diam:    2.95 cm LEFT ATRIUM            Index         RIGHT ATRIUM           Index LA diam:      3.20 cm  2.02 cm/m    RA Area:     14.80 cm LA Vol (A2C): 170.0 ml 107.39 ml/m  RA Volume:   36.00 ml  22.74 ml/m LA Vol (A4C): 69.0 ml  43.59 ml/m  AORTIC VALVE AV Area (Vmax):    1.99 cm AV Area (Vmean):   1.93 cm AV Area (VTI):     2.04 cm AV Vmax:           121.47 cm/s AV Vmean:          86.600 cm/s AV VTI:            0.242 m AV Peak Grad:      5.9 mmHg AV Mean Grad:      4.0 mmHg LVOT Vmax:         77.00 cm/s LVOT Vmean:        53.200 cm/s LVOT VTI:          0.157 m LVOT/AV VTI ratio: 0.65  AORTA Ao Root diam: 3.70 cm MITRAL VALVE                TRICUSPID VALVE MV Area (PHT): 3.85 cm     TR Peak grad:   13.2 mmHg MV Area VTI:   1.55 cm     TR Vmax:        182.00 cm/s MV Peak grad:  6.9 mmHg MV Mean grad:  3.0 mmHg     SHUNTS MV Vmax:       1.31 m/s  Systemic VTI:  0.16 m MV Vmean:      79.6 cm/s    Systemic Diam: 2.00 cm MV Decel Time: 197 msec MV E velocity: 112.00 cm/s MV A velocity: 63.50 cm/s MV E/A ratio:  1.76 Deatrice Cage MD Electronically signed by Deatrice Cage MD Signature Date/Time: 07/15/2023/1:22:57 PM    Final    DG ESOPHAGUS W SINGLE CM (SOL OR THIN BA) Result Date: 07/14/2023 CLINICAL DATA:  [id number] Dysphagia [id number] 68 year old female. Endorsing worsening dysphagia with solids. Request is for esophagram for further evaluation EXAM: ESOPHAGUS/BARIUM SWALLOW/TABLET STUDY TECHNIQUE: Single contrast examination was performed using thin liquid barium. This exam was performed by Delon Beagle NP, and was supervised and interpreted by Dr. Thom Hall. FLUOROSCOPY: Radiation Exposure Index and estimated peak skin dose (PSD); Reference air kerma (RAK), 14.2 mGy. Kerma-area product (KAP), 292.1 uGy*m. COMPARISON:  Chest XR, 07/13/2023.  CTA CAP, 11/05/2021. FINDINGS: Swallowing: Appears normal. No vestibular  penetration or aspiration seen. Pharynx: Unremarkable. Esophagus: Normal appearance. Esophageal motility: Mild esophageal dysmotility, with tertiary contractions. Otherwise, within normal limits. Hiatal Hernia: None. Gastroesophageal reflux: None visualized. Ingested 13mm barium tablet: Passed normally Other: None. IMPRESSION: No masses, lesions or significant dysmotility. Electronically Signed   By: Thom Hall M.D.   On: 07/14/2023 10:55   CT Angio Chest PE W/Cm &/Or Wo Cm Result Date: 07/13/2023 CLINICAL DATA:  Chest and abdominal pain for 2-4 weeks, hypoxia, dialysis * Tracking Code: BO * EXAM: CT ANGIOGRAPHY CHEST CT ABDOMEN AND PELVIS WITH CONTRAST TECHNIQUE: Multidetector CT imaging of the chest was performed using the standard protocol during bolus administration of intravenous contrast. Multiplanar CT image reconstructions and MIPs were obtained to evaluate the vascular anatomy. Multidetector CT imaging of the abdomen and pelvis was performed using the standard protocol during bolus administration of intravenous contrast. RADIATION DOSE REDUCTION: This exam was performed according to the departmental dose-optimization program which includes automated exposure control, adjustment of the mA and/or kV according to patient size and/or use of iterative reconstruction technique. CONTRAST:  OMNIPAQUE  IOHEXOL  350 MG/ML SOLN COMPARISON:  12/05/2021 FINDINGS: CT CHEST ANGIOGRAM FINDINGS Cardiovascular: Satisfactory opacification of the pulmonary arteries to the segmental level. No evidence of pulmonary embolism. Cardiomegaly. Left and right coronary artery calcifications. No pericardial effusion. Enlargement of the tubular ascending thoracic aorta measuring up to 4.6 x 4.5 cm. Severe aortic atherosclerosis. Mediastinum/Nodes: No enlarged mediastinal, hilar, or axillary lymph nodes. Thyroid  gland, trachea, and esophagus demonstrate no significant findings. Lungs/Pleura: Moderate bilateral pleural effusions  and associated atelectasis or consolidation. Extensive heterogeneous and ground-glass airspace disease throughout the right upper lobe (series 7, image 32). Diffuse interlobular septal thickening throughout both lungs. Musculoskeletal: No chest wall abnormality. No acute osseous findings. Review of the MIP images confirms the above findings. CT ABDOMEN PELVIS FINDINGS Hepatobiliary: No solid liver abnormality is seen. No gallstones, gallbladder wall thickening, or biliary dilatation. Pancreas: Unremarkable. No pancreatic ductal dilatation or surrounding inflammatory changes. Spleen: Normal in size without significant abnormality. Adrenals/Urinary Tract: Adrenal glands are unremarkable. Severely atrophic cystic kidneys, consistent with chronic renal disease and dialysis. Enhancing mass arising from the inferior pole of the right kidney measuring 2.5 x 2.4 cm (series 12, image 23). Appearance of the posterior midportion of the left kidney is suspicious for a second more irregular mass measuring 2.4 x 1.7 cm (series 12, image 22). Bladder is unremarkable. Stomach/Bowel: Stomach is within normal limits. Appendix not clearly visualized. No evidence of bowel wall thickening, distention, or inflammatory changes. Vascular/Lymphatic: Severe aortic atherosclerosis. Large-bore right  femoral approach dialysis catheter. No enlarged abdominal or pelvic lymph nodes. Reproductive: Hysterectomy. Other: No abdominal wall hernia or abnormality. Trace free fluid in the low pelvis. Musculoskeletal: No acute or significant osseous findings. IMPRESSION: 1. Negative examination for pulmonary embolism. 2. Moderate bilateral pleural effusions and associated atelectasis or consolidation. Diffuse interlobular septal thickening throughout both lungs, findings consistent with pulmonary edema. 3. Extensive heterogeneous and ground-glass airspace disease throughout the right upper lobe, consistent with superimposed infection or aspiration. 4.  Cardiomegaly and coronary artery disease. 5. Enlargement of the tubular ascending thoracic aorta measuring up to 4.6 x 4.5 cm. Ascending thoracic aortic aneurysm. Recommend semi-annual imaging followup by CTA or MRA and referral to cardiothoracic surgery if not already obtained and if clinically appropriate. This recommendation follows 2010 ACCF/AHA/AATS/ACR/ASA/SCA/SCAI/SIR/STS/SVM Guidelines for the Diagnosis and Management of Patients With Thoracic Aortic Disease. Circulation. 2010; 121: Z733-z630. Aortic aneurysm NOS (ICD10-I71.9) 6. Severely atrophic cystic kidneys, consistent with chronic renal disease and dialysis. 7. Enhancing mass arising from the inferior pole of the right kidney measuring 2.5 x 2.4 cm, consistent with renal cell carcinoma. Appearance of the posterior midportion of the left kidney is suspicious for a second more irregular mass measuring 2.4 x 1.7 cm. Recommend multiphasic contrast enhanced CT or MRI on a nonemergent, outpatient basis for more confident assessment. Aortic Atherosclerosis (ICD10-I70.0). Electronically Signed   By: Marolyn JONETTA Jaksch M.D.   On: 07/13/2023 08:47   CT ABDOMEN PELVIS W CONTRAST Result Date: 07/13/2023 CLINICAL DATA:  Chest and abdominal pain for 2-4 weeks, hypoxia, dialysis * Tracking Code: BO * EXAM: CT ANGIOGRAPHY CHEST CT ABDOMEN AND PELVIS WITH CONTRAST TECHNIQUE: Multidetector CT imaging of the chest was performed using the standard protocol during bolus administration of intravenous contrast. Multiplanar CT image reconstructions and MIPs were obtained to evaluate the vascular anatomy. Multidetector CT imaging of the abdomen and pelvis was performed using the standard protocol during bolus administration of intravenous contrast. RADIATION DOSE REDUCTION: This exam was performed according to the departmental dose-optimization program which includes automated exposure control, adjustment of the mA and/or kV according to patient size and/or use of iterative  reconstruction technique. CONTRAST:  OMNIPAQUE  IOHEXOL  350 MG/ML SOLN COMPARISON:  12/05/2021 FINDINGS: CT CHEST ANGIOGRAM FINDINGS Cardiovascular: Satisfactory opacification of the pulmonary arteries to the segmental level. No evidence of pulmonary embolism. Cardiomegaly. Left and right coronary artery calcifications. No pericardial effusion. Enlargement of the tubular ascending thoracic aorta measuring up to 4.6 x 4.5 cm. Severe aortic atherosclerosis. Mediastinum/Nodes: No enlarged mediastinal, hilar, or axillary lymph nodes. Thyroid  gland, trachea, and esophagus demonstrate no significant findings. Lungs/Pleura: Moderate bilateral pleural effusions and associated atelectasis or consolidation. Extensive heterogeneous and ground-glass airspace disease throughout the right upper lobe (series 7, image 32). Diffuse interlobular septal thickening throughout both lungs. Musculoskeletal: No chest wall abnormality. No acute osseous findings. Review of the MIP images confirms the above findings. CT ABDOMEN PELVIS FINDINGS Hepatobiliary: No solid liver abnormality is seen. No gallstones, gallbladder wall thickening, or biliary dilatation. Pancreas: Unremarkable. No pancreatic ductal dilatation or surrounding inflammatory changes. Spleen: Normal in size without significant abnormality. Adrenals/Urinary Tract: Adrenal glands are unremarkable. Severely atrophic cystic kidneys, consistent with chronic renal disease and dialysis. Enhancing mass arising from the inferior pole of the right kidney measuring 2.5 x 2.4 cm (series 12, image 23). Appearance of the posterior midportion of the left kidney is suspicious for a second more irregular mass measuring 2.4 x 1.7 cm (series 12, image 22). Bladder is unremarkable. Stomach/Bowel: Stomach is within normal  limits. Appendix not clearly visualized. No evidence of bowel wall thickening, distention, or inflammatory changes. Vascular/Lymphatic: Severe aortic atherosclerosis.  Large-bore right femoral approach dialysis catheter. No enlarged abdominal or pelvic lymph nodes. Reproductive: Hysterectomy. Other: No abdominal wall hernia or abnormality. Trace free fluid in the low pelvis. Musculoskeletal: No acute or significant osseous findings. IMPRESSION: 1. Negative examination for pulmonary embolism. 2. Moderate bilateral pleural effusions and associated atelectasis or consolidation. Diffuse interlobular septal thickening throughout both lungs, findings consistent with pulmonary edema. 3. Extensive heterogeneous and ground-glass airspace disease throughout the right upper lobe, consistent with superimposed infection or aspiration. 4. Cardiomegaly and coronary artery disease. 5. Enlargement of the tubular ascending thoracic aorta measuring up to 4.6 x 4.5 cm. Ascending thoracic aortic aneurysm. Recommend semi-annual imaging followup by CTA or MRA and referral to cardiothoracic surgery if not already obtained and if clinically appropriate. This recommendation follows 2010 ACCF/AHA/AATS/ACR/ASA/SCA/SCAI/SIR/STS/SVM Guidelines for the Diagnosis and Management of Patients With Thoracic Aortic Disease. Circulation. 2010; 121: Z733-z630. Aortic aneurysm NOS (ICD10-I71.9) 6. Severely atrophic cystic kidneys, consistent with chronic renal disease and dialysis. 7. Enhancing mass arising from the inferior pole of the right kidney measuring 2.5 x 2.4 cm, consistent with renal cell carcinoma. Appearance of the posterior midportion of the left kidney is suspicious for a second more irregular mass measuring 2.4 x 1.7 cm. Recommend multiphasic contrast enhanced CT or MRI on a nonemergent, outpatient basis for more confident assessment. Aortic Atherosclerosis (ICD10-I70.0). Electronically Signed   By: Marolyn JONETTA Jaksch M.D.   On: 07/13/2023 08:47   DG Chest Portable 1 View Result Date: 07/13/2023 CLINICAL DATA:  Hypoxia.  Abdominal pain and chest pain. EXAM: PORTABLE CHEST 1 VIEW COMPARISON:  11/30/2021  FINDINGS: Stable cardiomediastinal contours. Aortic atherosclerosis. Small bilateral pleural effusions. Diffuse increase interstitial markings. No consolidative change. No signs of pneumothorax. No acute osseous findings. IMPRESSION: Small bilateral pleural effusions and diffuse increase interstitial markings compatible with pulmonary edema. Aortic Atherosclerosis (ICD10-I70.0). Electronically Signed   By: Waddell Calk M.D.   On: 07/13/2023 07:04    Microbiology: Results for orders placed or performed during the hospital encounter of 07/13/23  Resp panel by RT-PCR (RSV, Flu A&B, Covid) Anterior Nasal Swab     Status: None   Collection Time: 07/13/23  6:45 AM   Specimen: Anterior Nasal Swab  Result Value Ref Range Status   SARS Coronavirus 2 by RT PCR NEGATIVE NEGATIVE Final    Comment: (NOTE) SARS-CoV-2 target nucleic acids are NOT DETECTED.  The SARS-CoV-2 RNA is generally detectable in upper respiratory specimens during the acute phase of infection. The lowest concentration of SARS-CoV-2 viral copies this assay can detect is 138 copies/mL. A negative result does not preclude SARS-Cov-2 infection and should not be used as the sole basis for treatment or other patient management decisions. A negative result may occur with  improper specimen collection/handling, submission of specimen other than nasopharyngeal swab, presence of viral mutation(s) within the areas targeted by this assay, and inadequate number of viral copies(<138 copies/mL). A negative result must be combined with clinical observations, patient history, and epidemiological information. The expected result is Negative.  Fact Sheet for Patients:  BloggerCourse.com  Fact Sheet for Healthcare Providers:  SeriousBroker.it  This test is no t yet approved or cleared by the [place]  FDA and  has been authorized for detection and/or diagnosis of SARS-CoV-2 by FDA under an  Emergency Use Authorization (EUA). This EUA will remain  in effect (meaning this test can be used) for the duration  of the COVID-19 declaration under Section 564(b)(1) of the Act, 21 U.S.C.section 360bbb-3(b)(1), unless the authorization is terminated  or revoked sooner.       Influenza A by PCR NEGATIVE NEGATIVE Final   Influenza B by PCR NEGATIVE NEGATIVE Final    Comment: (NOTE) The Xpert Xpress SARS-CoV-2/FLU/RSV plus assay is intended as an aid in the diagnosis of influenza from Nasopharyngeal swab specimens and should not be used as a sole basis for treatment. Nasal washings and aspirates are unacceptable for Xpert Xpress SARS-CoV-2/FLU/RSV testing.  Fact Sheet for Patients: BloggerCourse.com  Fact Sheet for Healthcare Providers: SeriousBroker.it  This test is not yet approved or cleared by the [place]  FDA and has been authorized for detection and/or diagnosis of SARS-CoV-2 by FDA under an Emergency Use Authorization (EUA). This EUA will remain in effect (meaning this test can be used) for the duration of the COVID-19 declaration under Section 564(b)(1) of the Act, 21 U.S.C. section 360bbb-3(b)(1), unless the authorization is terminated or revoked.     Resp Syncytial Virus by PCR NEGATIVE NEGATIVE Final    Comment: (NOTE) Fact Sheet for Patients: BloggerCourse.com  Fact Sheet for Healthcare Providers: SeriousBroker.it  This test is not yet approved or cleared by the [place]  FDA and has been authorized for detection and/or diagnosis of SARS-CoV-2 by FDA under an Emergency Use Authorization (EUA). This EUA will remain in effect (meaning this test can be used) for the duration of the COVID-19 declaration under Section 564(b)(1) of the Act, 21 U.S.C. section 360bbb-3(b)(1), unless the authorization is terminated or revoked.  Performed at [facility], [address] [place], [place] [address]   Blood culture (routine x 2)     Status: None   Collection Time: 07/13/23  9:30 AM   Specimen: BLOOD  Result Value Ref Range Status   Specimen Description BLOOD BLOOD LEFT FOREARM  Final   Special Requests   Final    BOTTLES DRAWN AEROBIC AND ANAEROBIC Blood Culture results may not be optimal due to an inadequate volume of blood received in culture bottles   Culture   Final    NO GROWTH 5 DAYS Performed at [facility], [address] [place], [place] [address]    Report Status 07/18/2023 FINAL  Final  Blood culture (routine x 2)     Status: None   Collection Time: 07/13/23  9:40 AM   Specimen: BLOOD  Result Value Ref Range Status   Specimen Description BLOOD BLOOD RIGHT HAND  Final   Special Requests   Final    BOTTLES DRAWN AEROBIC AND ANAEROBIC Blood Culture results may not be optimal due to an inadequate volume of blood received in culture bottles   Culture   Final    NO GROWTH 5 DAYS Performed at [facility], [address], [place], [place] [address]    Report Status 07/18/2023 FINAL  Final  MRSA Next Gen by PCR, Nasal     Status: None   Collection Time: 07/14/23  5:30 AM   Specimen: Nasal Mucosa; Nasal Swab  Result Value Ref Range Status   MRSA by PCR Next Gen NOT DETECTED NOT DETECTED Final    Comment: (NOTE) The GeneXpert MRSA Assay (FDA approved for NASAL specimens only), is one component of a comprehensive MRSA colonization surveillance program. It is not intended to diagnose MRSA infection nor to guide or monitor treatment for MRSA infections. Test performance is not FDA approved in patients less than 62 years old. Performed  at [facility], [address] [place], [place] [address]     Labs: CBC: Recent Labs  Lab 07/13/23 0645 07/15/23 1614 07/15/23 2002 07/16/23 0516 07/17/23 0558 07/18/23 0540 07/19/23 0536  WBC 11.7* 5.7  --  7.2 6.1 6.6 5.7  NEUTROABS 9.1*  --    --   --   --   --   --   HGB 10.3* 7.4* 7.9* 7.9* 7.4* 8.7* 8.7*  HCT 30.4* 22.3* 23.5* 23.3* 22.0* 25.5* 25.6*  MCV 85.2 87.5  --  87.3 86.3 85.3 86.5  PLT 148* 148*  --  168 169 169 173   Basic Metabolic Panel: Recent Labs  Lab 07/13/23 0645 07/15/23 1614 07/16/23 1522 07/18/23 1115  NA 136 136 133* 135  K 3.7 3.6 4.7 3.8  CL 99 97* 97* 96*  CO2 21* 26 24 26   GLUCOSE 103* 138* 105* 121*  BUN 29* 23 34* 28*  CREATININE 7.62* 5.37* 6.98* 6.50*  CALCIUM  10.6* 9.9 10.0 10.2  PHOS  --   --  4.5 3.4   Liver Function Tests: Recent Labs  Lab 07/13/23 0645 07/16/23 1522 07/18/23 1115  AST 26  --   --   ALT 25  --   --   ALKPHOS 76  --   --   BILITOT 0.9  --   --   PROT 7.1  --   --   ALBUMIN  3.5 3.0* 2.7*   CBG: Recent Labs  Lab 07/18/23 0732 07/18/23 1158 07/18/23 1726 07/18/23 1944 07/19/23 0822  GLUCAP 98 119* 100* 135* 82    Discharge time spent: greater than 30 minutes.  Signed: Murvin Mana, MD [facility] 07/19/2023

## 2023-07-19 NOTE — Progress Notes (Addendum)
 [facility]  ROUNDING NOTE   Subjective:  [name] was admitted to [facility] on 07/13/2023 for Renal mass [N28.89] Acute respiratory failure with hypoxia (HCC) [J96.01] Pneumonia of right lung due to infectious organism, unspecified part of lung [J18.9] Acute hypoxic respiratory failure (HCC) [J96.01] Update: Patient in bed, on room air. No acute complaints. Reports feeling better today. Counseled on improving appetite and reduce risk of further weight loss.  Post HD weight yesterday was 53.9 kg New EDW to 54.5kg.    Objective:  Vital signs in last 24 hours:  Temp:  [97.9 F (36.6 C)-99.3 F (37.4 C)] 98.1 F (36.7 C) (07/12 1220) Pulse Rate:  [67-77] 70 (07/12 1220) Resp:  [16-18] 16 (07/12 1220) BP: (121-167)/(70-95) 121/70 (07/12 1220) SpO2:  [93 %-100 %] 100 % (07/12 1220) Weight:  [58.1 kg] 58.1 kg (07/12 0500)  Weight change: -3.2 kg Filed Weights   07/18/23 1300 07/18/23 1654 07/19/23 0500  Weight: 54.9 kg 53.9 kg 58.1 kg    Intake/Output: I/O last 3 completed shifts: In: -  Out: 500 [Other:500]   Intake/Output this shift:  Total I/O In: 60 [P.O.:60] Out: -   Physical Exam: General: NAD  Head: Normocephalic, atraumatic. Moist oral mucosal membranes  Eyes: Anicteric  Neck: Supple, trachea midline  Lungs:  Clear to auscultation, room air  Heart: Regular rate and rhythm  Abdomen:  Soft, nontender,   Extremities:  No peripheral edema.  Neurologic: Alert/awake  Skin: No lesions  Access: Rt fem permcath    Basic Metabolic Panel: Recent Labs  Lab 07/13/23 0645 07/15/23 1614 07/16/23 1522 07/18/23 1115  NA 136 136 133* 135  K 3.7 3.6 4.7 3.8  CL 99 97* 97* 96*  CO2 21* 26 24 26   GLUCOSE 103* 138* 105* 121*  BUN 29* 23 34* 28*  CREATININE 7.62* 5.37* 6.98* 6.50*  CALCIUM  10.6* 9.9 10.0 10.2  PHOS  --   --  4.5 3.4    Liver Function Tests: Recent Labs  Lab 07/13/23 0645 07/16/23 1522 07/18/23 1115  AST 26  --   --   ALT  25  --   --   ALKPHOS 76  --   --   BILITOT 0.9  --   --   PROT 7.1  --   --   ALBUMIN  3.5 3.0* 2.7*   Recent Labs  Lab 07/13/23 0645  LIPASE 25   No results for input(s): AMMONIA in the last 168 hours.  CBC: Recent Labs  Lab 07/13/23 0645 07/15/23 1614 07/15/23 2002 07/16/23 0516 07/17/23 0558 07/18/23 0540 07/19/23 0536  WBC 11.7* 5.7  --  7.2 6.1 6.6 5.7  NEUTROABS 9.1*  --   --   --   --   --   --   HGB 10.3* 7.4* 7.9* 7.9* 7.4* 8.7* 8.7*  HCT 30.4* 22.3* 23.5* 23.3* 22.0* 25.5* 25.6*  MCV 85.2 87.5  --  87.3 86.3 85.3 86.5  PLT 148* 148*  --  168 169 169 173    Cardiac Enzymes: No results for input(s): CKTOTAL, CKMB, CKMBINDEX, TROPONINI in the last 168 hours.  BNP: Invalid input(s): POCBNP  CBG: Recent Labs  Lab 07/18/23 1158 07/18/23 1726 07/18/23 1944 07/19/23 0822 07/19/23 1222  GLUCAP 119* 100* 135* 82 106*    Microbiology: Results for orders placed or performed during the hospital encounter of 07/13/23  Resp panel by RT-PCR (RSV, Flu A&B, Covid) Anterior Nasal Swab     Status: None   Collection  Time: 07/13/23  6:45 AM   Specimen: Anterior Nasal Swab  Result Value Ref Range Status   SARS Coronavirus 2 by RT PCR NEGATIVE NEGATIVE Final    Comment: (NOTE) SARS-CoV-2 target nucleic acids are NOT DETECTED.  The SARS-CoV-2 RNA is generally detectable in upper respiratory specimens during the acute phase of infection. The lowest concentration of SARS-CoV-2 viral copies this assay can detect is 138 copies/mL. A negative result does not preclude SARS-Cov-2 infection and should not be used as the sole basis for treatment or other patient management decisions. A negative result may occur with  improper specimen collection/handling, submission of specimen other than nasopharyngeal swab, presence of viral mutation(s) within the areas targeted by this assay, and inadequate number of viral copies(<138 copies/mL). A negative result must be  combined with clinical observations, patient history, and epidemiological information. The expected result is Negative.  Fact Sheet for Patients:  BloggerCourse.com  Fact Sheet for Healthcare Providers:  SeriousBroker.it  This test is no t yet approved or cleared by the [place]  FDA and  has been authorized for detection and/or diagnosis of SARS-CoV-2 by FDA under an Emergency Use Authorization (EUA). This EUA will remain  in effect (meaning this test can be used) for the duration of the COVID-19 declaration under Section 564(b)(1) of the Act, 21 U.S.C.section 360bbb-3(b)(1), unless the authorization is terminated  or revoked sooner.       Influenza A by PCR NEGATIVE NEGATIVE Final   Influenza B by PCR NEGATIVE NEGATIVE Final    Comment: (NOTE) The Xpert Xpress SARS-CoV-2/FLU/RSV plus assay is intended as an aid in the diagnosis of influenza from Nasopharyngeal swab specimens and should not be used as a sole basis for treatment. Nasal washings and aspirates are unacceptable for Xpert Xpress SARS-CoV-2/FLU/RSV testing.  Fact Sheet for Patients: BloggerCourse.com  Fact Sheet for Healthcare Providers: SeriousBroker.it  This test is not yet approved or cleared by the [place]  FDA and has been authorized for detection and/or diagnosis of SARS-CoV-2 by FDA under an Emergency Use Authorization (EUA). This EUA will remain in effect (meaning this test can be used) for the duration of the COVID-19 declaration under Section 564(b)(1) of the Act, 21 U.S.C. section 360bbb-3(b)(1), unless the authorization is terminated or revoked.     Resp Syncytial Virus by PCR NEGATIVE NEGATIVE Final    Comment: (NOTE) Fact Sheet for Patients: BloggerCourse.com  Fact Sheet for Healthcare Providers: SeriousBroker.it  This test is not yet  approved or cleared by the [place]  FDA and has been authorized for detection and/or diagnosis of SARS-CoV-2 by FDA under an Emergency Use Authorization (EUA). This EUA will remain in effect (meaning this test can be used) for the duration of the COVID-19 declaration under Section 564(b)(1) of the Act, 21 U.S.C. section 360bbb-3(b)(1), unless the authorization is terminated or revoked.  Performed at [facility], [address] [place], [place] [address]   Blood culture (routine x 2)     Status: None   Collection Time: 07/13/23  9:30 AM   Specimen: BLOOD  Result Value Ref Range Status   Specimen Description BLOOD BLOOD LEFT FOREARM  Final   Special Requests   Final    BOTTLES DRAWN AEROBIC AND ANAEROBIC Blood Culture results may not be optimal due to an inadequate volume of blood received in culture bottles   Culture   Final    NO GROWTH 5 DAYS Performed at [facility], [address], [place], [place] [address]  Report Status 07/18/2023 FINAL  Final  Blood culture (routine x 2)     Status: None   Collection Time: 07/13/23  9:40 AM   Specimen: BLOOD  Result Value Ref Range Status   Specimen Description BLOOD BLOOD RIGHT HAND  Final   Special Requests   Final    BOTTLES DRAWN AEROBIC AND ANAEROBIC Blood Culture results may not be optimal due to an inadequate volume of blood received in culture bottles   Culture   Final    NO GROWTH 5 DAYS Performed at [facility], [address] [place], [place] [address]    Report Status 07/18/2023 FINAL  Final  MRSA Next Gen by PCR, Nasal     Status: None   Collection Time: 07/14/23  5:30 AM   Specimen: Nasal Mucosa; Nasal Swab  Result Value Ref Range Status   MRSA by PCR Next Gen NOT DETECTED NOT DETECTED Final    Comment: (NOTE) The GeneXpert MRSA Assay (FDA approved for NASAL specimens only), is one component of a comprehensive MRSA colonization surveillance program. It is not intended to  diagnose MRSA infection nor to guide or monitor treatment for MRSA infections. Test performance is not FDA approved in patients less than 85 years old. Performed at [facility], [address] [place], [place] [address]     Coagulation Studies: No results for input(s): LABPROT, INR in the last 72 hours.  Urinalysis: No results for input(s): COLORURINE, LABSPEC, PHURINE, GLUCOSEU, HGBUR, BILIRUBINUR, KETONESUR, PROTEINUR, UROBILINOGEN, NITRITE, LEUKOCYTESUR in the last 72 hours.  Invalid input(s): APPERANCEUR    Imaging: No results found.   Medications:     sodium chloride    Intravenous Once   amiodarone   200 mg Oral Daily   carvedilol   12.5 mg Oral BID   Chlorhexidine  Gluconate Cloth  6 each Topical Q0600   feeding supplement  237 mL Oral BID BM   insulin  aspart  0-6 Units Subcutaneous TID WC   multivitamin  1 tablet Oral Daily   pantoprazole   40 mg Oral BID AC   polyethylene glycol  17 g Oral Daily   sacubitril -valsartan   1 tablet Oral BID   senna-docusate  2 tablet Oral BID   sertraline   150 mg Oral Daily   traZODone   50 mg Oral QHS   zolpidem   5 mg Oral QHS   acetaminophen , butalbital -acetaminophen -caffeine , calcium  carbonate  Assessment/ Plan:  [name] is a 68 y.o.  female  with end stage renal disease on hemodialysis, hypertension, hyperlipidemia, congestive heart failure, atrial fibrillation, gout, and DVT who is admitted to [facility] on 07/13/2023 for Renal mass [N28.89] Acute respiratory failure with hypoxia (HCC) [J96.01] Pneumonia of right lung due to infectious organism, unspecified part of lung [J18.9] Acute hypoxic respiratory failure (HCC) [J96.01]   [facility] Right femoral catheter 55kg.    End Stage Renal Disease: was 2kg above target weight and acute pulmonary failure.  - Next treatment scheduled for Monday.  - Adjust dry weight to 54.5kg. - Will defer discharge plan to primary  team.   Acute Respiratory failure: requiring [place] O2. With pulmonary edema and right upper lobe pneumonia.  - empiric antibiotics, cefepime  -Room air   Hypertension with chronic kidney disease:  - home regimen of isosorbide  mononitrate, Entresto , and carvedilol  - Continue carvedilol  and entresto  BP 121/70 today   Secondary Hyperparathyroidism with hypercalcemia.  - receives etelcalcetide as outpatient.  - sevelamer  with meals.  -Calcium  and phosphorus at goal   Renal  mass: right. Consistent with RCC. Incidental finding.  - Consult urology for outpatient follow up.  - hold esa.        LOS: 6 Teresa Cook 7/12/20255:02 PM  Patient was seen and examined with Cook Latin, NP.  Plan of care was formulated for the problems addressed and discussed with the nurse practitioner. I agree with the note as documented except as noted below.

## 2023-07-19 NOTE — Progress Notes (Signed)
 Pt alert and oriented. VSS. Discharge instructions reviewed with pt. Pt verbalized understanding. Pt verbalized understanding of need to schedule/attend follow up appointments. Pt verbalized understanding of changes to medications. IV removed. IV site WNL. Printed discharge instructions given to pt. Pt femoral hemodialysis catheter in place. Dressing clean,dry, intact. Pt left unit in wheelchair with nurse. Pt left hospital with family.

## 2023-07-21 LAB — TYPE AND SCREEN
ABO/RH(D): O POS
Antibody Screen: NEGATIVE
Donor AG Type: NEGATIVE
Unit division: 0
Unit division: 0
Unit division: 0

## 2023-07-21 LAB — BPAM RBC
Blood Product Expiration Date: 202508032359
Blood Product Expiration Date: 202508032359
Blood Product Expiration Date: 202508032359
ISSUE DATE / TIME: 202507101357
Unit Type and Rh: 5100
Unit Type and Rh: 5100
Unit Type and Rh: 5100

## 2023-08-19 NOTE — Progress Notes (Deleted)
 08/20/2023 9:30 PM   Teresa Cook [birth year] [id number]  Referring provider: [facility] [address] [place],  [place] [address]  Urological history: 1. High risk hematuria - ESRD - non smoker - bilateral URS and cysto (2023) - NED   No chief complaint on file.  HPI: Teresa Cook is a 68 y.o. woman who presents today for renal mass.   Previous records reviewed.   During her last hospital admission in July a contrast CT noted an enhancing mass from the inferior pole of right kidney measuing 2.5 cm conssitent w/ RCC and a suspicious irregular mass measuing 2.4 in the posterior midportion of the left kidney.     Lab Results  Component Value Date   WBC 5.7 07/19/2023   HGB 8.7 (L) 07/19/2023   HCT 25.6 (L) 07/19/2023   MCV 86.5 07/19/2023   PLT 173 07/19/2023    Lab Results  Component Value Date   CREATININE 6.50 (H) 07/18/2023     Lab Results  Component Value Date   HGBA1C 4.7 (L) 07/13/2023    PMH: Past Medical History:  Diagnosis Date   Anemia    Anxiety    Breast cancer (HCC) 01/2016   bilateral   CHF (congestive heart failure) (HCC)    Chronic kidney disease    Depression    Dialysis patient [facility])    DVT (deep venous thrombosis) (HCC)    left leg   DVT (deep venous thrombosis) (HCC) 1985   right thigh   Dysrhythmia    Gout    Headache    HTN (hypertension)    Hypertension    Nausea vomiting and diarrhea    Parathyroid  abnormality (HCC)    Parathyroid  disease (HCC)    Pneumonia 12/2015   Psoriasis    Renal insufficiency    Sickle cell trait (HCC)    traits    Surgical History: Past Surgical History:  Procedure Laterality Date   ABDOMINAL HYSTERECTOMY  1980   APPENDECTOMY     BREAST BIOPSY Left 10/28/2013   benign   BREAST EXCISIONAL BIOPSY Left 2002   benign   CORONARY ANGIOGRAPHY N/A 04/04/2020   Procedure: CORONARY ANGIOGRAPHY;  Surgeon: Darron Deatrice LABOR, MD;  Location: [facility]  INVASIVE CV LAB;  Service: Cardiovascular;  Laterality: N/A;   CYSTOSCOPY N/A 12/06/2021   Procedure: CYSTOSCOPY;  Surgeon: Twylla Glendia BROCKS, MD;  Location: [facility] ORS;  Service: Urology;  Laterality: N/A;   DIALYSIS/PERMA CATHETER INSERTION N/A 02/27/2022   Procedure: DIALYSIS/PERMA CATHETER INSERTION;  Surgeon: Marea Selinda RAMAN, MD;  Location: [facility] INVASIVE CV LAB;  Service: Cardiovascular;  Laterality: N/A;   INSERTION OF DIALYSIS CATHETER  2014   LIPOMA EXCISION N/A 01/23/2016   Procedure: EXCISION LIPOMA;  Surgeon: Dorothyann LITTIE Husk, MD;  Location: [facility] ORS;  Service: General;  Laterality: N/A;   MASTECTOMY W/ SENTINEL NODE BIOPSY Bilateral 01/23/2016   Procedure: bilateral MASTECTOMY WITH  bilateral SENTINEL LYMPH NODE BIOPSY possible left axillary node dissection forehead lipoma removal;  Surgeon: Dorothyann LITTIE Husk, MD;  Location: [facility] ORS;  Service: General;  Laterality: Bilateral;   PARTIAL HYSTERECTOMY     PERIPHERAL VASCULAR CATHETERIZATION N/A 12/25/2015   Procedure: Dialysis/Perma Catheter Insertion;  Surgeon: Selinda RAMAN Marea, MD;  Location: [facility] INVASIVE CV LAB;  Service: Cardiovascular;  Laterality: N/A;   PERIPHERAL VASCULAR CATHETERIZATION Left 01/22/2016   Procedure: Dialysis/Perma Catheter Insertion;  Surgeon: Selinda RAMAN Marea, MD;  Location: [facility] INVASIVE CV LAB;  Service: Cardiovascular;  Laterality:  Left;   PERIPHERAL VASCULAR CATHETERIZATION N/A 01/26/2016   Procedure: Dialysis/Perma Catheter Insertion;  Surgeon: Cordella KANDICE Shawl, MD;  Location: [facility] INVASIVE CV LAB;  Service: Cardiovascular;  Laterality: N/A;   PORT-A-CATH REMOVAL N/A 12/20/2015   Procedure: REMOVAL PORT-A-CATH;  Surgeon: Dorothyann LITTIE Husk, MD;  Location: [facility] ORS;  Service: General;  Laterality: N/A;  left     PORTACATH PLACEMENT Left 08/21/2015   Procedure: INSERTION PORT-A-CATH;  Surgeon: Dorothyann LITTIE Husk, MD;  Location: [facility] ORS;  Service: General;  Laterality: Left;   REMOVAL OF A DIALYSIS CATHETER  2017   RENAL  ANGIOGRAPHY Right 11/05/2021   Procedure: RENAL ANGIOGRAPHY;  Surgeon: Marea Selinda RAMAN, MD;  Location: [facility] INVASIVE CV LAB;  Service: Cardiovascular;  Laterality: Right;  with embolization   RIGHT HEART CATH N/A 04/04/2020   Procedure: RIGHT HEART CATH;  Surgeon: Darron Deatrice LABOR, MD;  Location: [facility] INV[facility]IVE CV LAB;  Service: Cardiovascular;  Laterality: N/A;   URETEROSCOPY Bilateral 12/06/2021   Procedure: URETEROSCOPY;  Surgeon: Twylla Glendia BROCKS, MD;  Location: [facility] ORS;  Service: Urology;  Laterality: Bilateral;    Home Medications:  Allergies as of 08/20/2023       Reactions   Amlodipine  Palpitations   Gabapentin  Other (See Comments)   Seizure   Hydrochlorothiazide    Other Reaction(s): gout   Atorvastatin     Other Reaction(s): myalgias   Lisinopril Cough   Adhesive [tape] Itching   Silk tape is ok to use.        Medication List        Accurate as of August 19, 2023  9:30 PM. If you have any questions, ask your nurse or doctor.          amiodarone  200 MG tablet Commonly known as: PACERONE  Take 1 tablet (200 mg total) by mouth 2 (two) times daily.   b complex-vitamin c-folic acid  0.8 MG Tabs tablet Take 1 tablet by mouth daily.   carvedilol  12.5 MG tablet Commonly known as: COREG  Take 12.5 mg by mouth 2 (two) times daily.   Entresto  24-26 MG Generic drug: sacubitril -valsartan  Take 1 tablet by mouth 2 (two) times daily.   pantoprazole  40 MG tablet Commonly known as: PROTONIX  Take 1 tablet (40 mg total) by mouth 2 (two) times daily. What changed: when to take this   sertraline  100 MG tablet Commonly known as: ZOLOFT  Take 150 mg by mouth daily.   sevelamer  carbonate 800 MG tablet Commonly known as: RENVELA  Take 800 mg by mouth 3 (three) times daily with meals. Take 1 tablet with snack   traZODone  50 MG tablet Commonly known as: DESYREL  Take 50 mg by mouth at bedtime.   zolpidem  10 MG tablet Commonly known as: AMBIEN  Take 0.5 tablets (5 mg total) by  mouth at bedtime. What changed: how much to take        Allergies:  Allergies  Allergen Reactions   Amlodipine  Palpitations   Gabapentin  Other (See Comments)    Seizure   Hydrochlorothiazide     Other Reaction(s): gout   Atorvastatin      Other Reaction(s): myalgias   Lisinopril Cough   Adhesive [Tape] Itching    Silk tape is ok to use.    Family History: Family History  Problem Relation Age of Onset   Stroke Mother    CVA Mother    Hypertension Mother    Hypertension Father    Hypertension Sister    Diabetes Sister    Cancer Sister 88  Breast   Stroke Brother    Hypertension Brother    Breast cancer Maternal Aunt 14    Social History: See HPI for pertinent social history  ROS: Pertinent ROS in HPI  Physical Exam: There were no vitals taken for this visit.  Constitutional:  Well nourished. Alert and oriented, No acute distress. HEENT: [place] AT, moist mucus membranes.  Trachea midline, no masses. Cardiovascular: No clubbing, cyanosis, or edema. Respiratory: Normal respiratory effort, no increased work of breathing. GU: No CVA tenderness.  No bladder fullness or masses.  Recession of labia minora, dry, pale vulvar vaginal mucosa and loss of mucosal ridges and folds.  Normal urethral meatus, no lesions, no prolapse, no discharge.   No urethral masses, tenderness and/or tenderness. No bladder fullness, tenderness or masses. *** vagina mucosa, *** estrogen effect, no discharge, no lesions, *** pelvic support, *** cystocele and *** rectocele noted.  No cervical motion tenderness.  Uterus is freely mobile and non-fixed.  No adnexal/parametria masses or tenderness noted.  Anus and perineum are without rashes or lesions.   ***  Neurologic: Grossly intact, no focal deficits, moving all 4 extremities. Psychiatric: Normal mood and affect.    Laboratory Data: See EPIC and HPI  I have reviewed the labs.   Pertinent Imaging: CLINICAL DATA:  Chest and abdominal pain for  2-4 weeks, hypoxia, dialysis * Tracking Code: BO *   EXAM: CT ANGIOGRAPHY CHEST   CT ABDOMEN AND PELVIS WITH CONTRAST   TECHNIQUE: Multidetector CT imaging of the chest was performed using the standard protocol during bolus administration of intravenous contrast. Multiplanar CT image reconstructions and MIPs were obtained to evaluate the vascular anatomy. Multidetector CT imaging of the abdomen and pelvis was performed using the standard protocol during bolus administration of intravenous contrast.   RADIATION DOSE REDUCTION: This exam was performed according to the departmental dose-optimization program which includes automated exposure control, adjustment of the mA and/or kV according to patient size and/or use of iterative reconstruction technique.   CONTRAST:  OMNIPAQUE  IOHEXOL  350 MG/ML SOLN   COMPARISON:  12/05/2021   FINDINGS: CT CHEST ANGIOGRAM FINDINGS   Cardiovascular: Satisfactory opacification of the pulmonary arteries to the segmental level. No evidence of pulmonary embolism. Cardiomegaly. Left and right coronary artery calcifications. No pericardial effusion. Enlargement of the tubular ascending thoracic aorta measuring up to 4.6 x 4.5 cm. Severe aortic atherosclerosis.   Mediastinum/Nodes: No enlarged mediastinal, hilar, or axillary lymph nodes. Thyroid  gland, trachea, and esophagus demonstrate no significant findings.   Lungs/Pleura: Moderate bilateral pleural effusions and associated atelectasis or consolidation. Extensive heterogeneous and ground-glass airspace disease throughout the right upper lobe (series 7, image 32). Diffuse interlobular septal thickening throughout both lungs.   Musculoskeletal: No chest wall abnormality. No acute osseous findings.   Review of the MIP images confirms the above findings.   CT ABDOMEN PELVIS FINDINGS   Hepatobiliary: No solid liver abnormality is seen. No gallstones, gallbladder wall thickening, or  biliary dilatation.   Pancreas: Unremarkable. No pancreatic ductal dilatation or surrounding inflammatory changes.   Spleen: Normal in size without significant abnormality.   Adrenals/Urinary Tract: Adrenal glands are unremarkable. Severely atrophic cystic kidneys, consistent with chronic renal disease and dialysis. Enhancing mass arising from the inferior pole of the right kidney measuring 2.5 x 2.4 cm (series 12, image 23). Appearance of the posterior midportion of the left kidney is suspicious for a second more irregular mass measuring 2.4 x 1.7 cm (series 12, image 22). Bladder is unremarkable.   Stomach/Bowel:  Stomach is within normal limits. Appendix not clearly visualized. No evidence of bowel wall thickening, distention, or inflammatory changes.   Vascular/Lymphatic: Severe aortic atherosclerosis. Large-bore right femoral approach dialysis catheter. No enlarged abdominal or pelvic lymph nodes.   Reproductive: Hysterectomy.   Other: No abdominal wall hernia or abnormality. Trace free fluid in the low pelvis.   Musculoskeletal: No acute or significant osseous findings.   IMPRESSION: 1. Negative examination for pulmonary embolism. 2. Moderate bilateral pleural effusions and associated atelectasis or consolidation. Diffuse interlobular septal thickening throughout both lungs, findings consistent with pulmonary edema. 3. Extensive heterogeneous and ground-glass airspace disease throughout the right upper lobe, consistent with superimposed infection or aspiration. 4. Cardiomegaly and coronary artery disease. 5. Enlargement of the tubular ascending thoracic aorta measuring up to 4.6 x 4.5 cm. Ascending thoracic aortic aneurysm. Recommend semi-annual imaging followup by CTA or MRA and referral to cardiothoracic surgery if not already obtained and if clinically appropriate. This recommendation follows 2010 ACCF/AHA/AATS/ACR/ASA/SCA/SCAI/SIR/STS/SVM Guidelines for  the Diagnosis and Management of Patients With Thoracic Aortic Disease. Circulation. 2010; 121: Z733-z630. Aortic aneurysm NOS (ICD10-I71.9) 6. Severely atrophic cystic kidneys, consistent with chronic renal disease and dialysis. 7. Enhancing mass arising from the inferior pole of the right kidney measuring 2.5 x 2.4 cm, consistent with renal cell carcinoma. Appearance of the posterior midportion of the left kidney is suspicious for a second more irregular mass measuring 2.4 x 1.7 cm. Recommend multiphasic contrast enhanced CT or MRI on a nonemergent, outpatient basis for more confident assessment.   Aortic Atherosclerosis (ICD10-I70.0).     Electronically Signed   By: Marolyn JONETTA Jaksch M.D.   On: 07/13/2023 08:47 I have independently reviewed the films.  See HPI.    Assessment & Plan:  ***  1. Left renal masses - will pursue renal MRI for further stratification of renal masses   No follow-ups on file.  These notes generated with voice recognition software. I apologize for typographical errors.  CLOTILDA HELON RIGGERS  [facility] [address]  [address] [place], [place] [address] [phone]

## 2023-08-20 ENCOUNTER — Ambulatory Visit: Admitting: Urology

## 2023-08-20 DIAGNOSIS — N2889 Other specified disorders of kidney and ureter: Secondary | ICD-10-CM

## 2023-08-25 ENCOUNTER — Inpatient Hospital Stay: Attending: Oncology | Admitting: Oncology

## 2023-11-05 ENCOUNTER — Telehealth: Payer: Self-pay | Admitting: Urology

## 2023-11-05 NOTE — Telephone Encounter (Signed)
 She was supposed to follow-up in August for an MRI of her kidneys for renal mass, but she no-showed.  Would you call her and get her rescheduled?

## 2023-11-18 ENCOUNTER — Encounter: Payer: Self-pay | Admitting: Urology

## 2023-11-19 ENCOUNTER — Telehealth: Payer: Self-pay

## 2023-11-19 NOTE — Telephone Encounter (Signed)
 Mailed certified letter to pt per [facility] request.

## 2024-01-05 ENCOUNTER — Telehealth (INDEPENDENT_AMBULATORY_CARE_PROVIDER_SITE_OTHER): Payer: Self-pay

## 2024-01-05 NOTE — Telephone Encounter (Signed)
 Per [employer] a fax was received on 01/02/24 for a permcath exchange for this patient. Our office was closed until 01/05/24 and that was stated. I attempted to contact [facility] to check if patient has been taken care of and was unable to speak with anyone. I then attempted to contact the patient and had to leave a message for a return call.

## 2024-01-07 ENCOUNTER — Telehealth (INDEPENDENT_AMBULATORY_CARE_PROVIDER_SITE_OTHER): Payer: Self-pay

## 2024-01-07 NOTE — Telephone Encounter (Signed)
 I attempted to contact the patient to scheduled a permcath exchange. A message was left for a return call.

## 2024-01-19 ENCOUNTER — Encounter
Admission: RE | Disposition: A | Discharge status: Home / Self Care | Payer: Self-pay | Source: Home / Self Care | Attending: Vascular Surgery

## 2024-01-19 ENCOUNTER — Ambulatory Visit
Admission: RE | Admit: 2024-01-19 | Discharge: 2024-01-19 | Disposition: A | Discharge status: Home / Self Care | Source: Home / Self Care | Attending: Vascular Surgery | Admitting: Vascular Surgery

## 2024-01-19 ENCOUNTER — Telehealth (INDEPENDENT_AMBULATORY_CARE_PROVIDER_SITE_OTHER): Payer: Self-pay

## 2024-01-19 ENCOUNTER — Emergency Department
Admission: EM | Admit: 2024-01-19 | Discharge: 2024-01-20 | Disposition: A | Discharge status: Home / Self Care | Attending: Emergency Medicine | Admitting: Emergency Medicine

## 2024-01-19 ENCOUNTER — Encounter: Payer: Self-pay | Admitting: Vascular Surgery

## 2024-01-19 ENCOUNTER — Other Ambulatory Visit: Payer: Self-pay

## 2024-01-19 DIAGNOSIS — Y839 Surgical procedure, unspecified as the cause of abnormal reaction of the patient, or of later complication, without mention of misadventure at the time of the procedure: Secondary | ICD-10-CM | POA: Insufficient documentation

## 2024-01-19 DIAGNOSIS — N186 End stage renal disease: Secondary | ICD-10-CM

## 2024-01-19 DIAGNOSIS — T82838A Hemorrhage of vascular prosthetic devices, implants and grafts, initial encounter: Secondary | ICD-10-CM | POA: Insufficient documentation

## 2024-01-19 DIAGNOSIS — Z992 Dependence on renal dialysis: Secondary | ICD-10-CM

## 2024-01-19 DIAGNOSIS — T8249XA Other complication of vascular dialysis catheter, initial encounter: Secondary | ICD-10-CM

## 2024-01-19 DIAGNOSIS — I120 Hypertensive chronic kidney disease with stage 5 chronic kidney disease or end stage renal disease: Secondary | ICD-10-CM | POA: Insufficient documentation

## 2024-01-19 DIAGNOSIS — E1122 Type 2 diabetes mellitus with diabetic chronic kidney disease: Secondary | ICD-10-CM | POA: Insufficient documentation

## 2024-01-19 DIAGNOSIS — Z7901 Long term (current) use of anticoagulants: Secondary | ICD-10-CM | POA: Insufficient documentation

## 2024-01-19 DIAGNOSIS — T8241XA Breakdown (mechanical) of vascular dialysis catheter, initial encounter: Secondary | ICD-10-CM | POA: Insufficient documentation

## 2024-01-19 DIAGNOSIS — Y732 Prosthetic and other implants, materials and accessory gastroenterology and urology devices associated with adverse incidents: Secondary | ICD-10-CM | POA: Diagnosis not present

## 2024-01-19 DIAGNOSIS — R58 Hemorrhage, not elsewhere classified: Secondary | ICD-10-CM

## 2024-01-19 DIAGNOSIS — I509 Heart failure, unspecified: Secondary | ICD-10-CM | POA: Insufficient documentation

## 2024-01-19 HISTORY — PX: DIALYSIS/PERMA CATHETER INSERTION: CATH118288

## 2024-01-19 LAB — CBC WITH DIFFERENTIAL/PLATELET
Abs Immature Granulocytes: 0.05 K/uL (ref 0.00–0.07)
Basophils Absolute: 0 K/uL (ref 0.0–0.1)
Basophils Relative: 0 %
Eosinophils Absolute: 0.2 K/uL (ref 0.0–0.5)
Eosinophils Relative: 2 %
HCT: 34.2 % — ABNORMAL LOW (ref 36.0–46.0)
Hemoglobin: 11.2 g/dL — ABNORMAL LOW (ref 12.0–15.0)
Immature Granulocytes: 1 %
Lymphocytes Relative: 15 %
Lymphs Abs: 1.4 K/uL (ref 0.7–4.0)
MCH: 28.3 pg (ref 26.0–34.0)
MCHC: 32.7 g/dL (ref 30.0–36.0)
MCV: 86.4 fL (ref 80.0–100.0)
Monocytes Absolute: 0.7 K/uL (ref 0.1–1.0)
Monocytes Relative: 7 %
Neutro Abs: 7.1 K/uL (ref 1.7–7.7)
Neutrophils Relative %: 75 %
Platelets: 165 K/uL (ref 150–400)
RBC: 3.96 MIL/uL (ref 3.87–5.11)
RDW: 16.3 % — ABNORMAL HIGH (ref 11.5–15.5)
WBC: 9.5 K/uL (ref 4.0–10.5)
nRBC: 0 % (ref 0.0–0.2)

## 2024-01-19 LAB — BASIC METABOLIC PANEL WITH GFR
Anion gap: 18 — ABNORMAL HIGH (ref 5–15)
BUN: 72 mg/dL — ABNORMAL HIGH (ref 8–23)
CO2: 21 mmol/L — ABNORMAL LOW (ref 22–32)
Calcium: 9 mg/dL (ref 8.9–10.3)
Chloride: 98 mmol/L (ref 98–111)
Creatinine, Ser: 11.1 mg/dL — ABNORMAL HIGH (ref 0.44–1.00)
GFR, Estimated: 3 mL/min — ABNORMAL LOW
Glucose, Bld: 108 mg/dL — ABNORMAL HIGH (ref 70–99)
Potassium: 4.4 mmol/L (ref 3.5–5.1)
Sodium: 137 mmol/L (ref 135–145)

## 2024-01-19 MED ORDER — CEFAZOLIN SODIUM-DEXTROSE 1-4 GM/50ML-% IV SOLN
INTRAVENOUS | Status: DC | PRN
  Administered 2024-01-19: 1 g via INTRAVENOUS

## 2024-01-19 MED ORDER — HEPARIN (PORCINE) IN NACL 1000-0.9 UT/500ML-% IV SOLN
INTRAVENOUS | Status: DC | PRN
  Administered 2024-01-19: 500 mL

## 2024-01-19 MED ORDER — LIDOCAINE-EPINEPHRINE (PF) 1 %-1:200000 IJ SOLN
INTRAMUSCULAR | Status: DC | PRN
  Administered 2024-01-19: 20 mL

## 2024-01-19 MED ORDER — MIDAZOLAM HCL (PF) 2 MG/2ML IJ SOLN
INTRAMUSCULAR | Status: DC | PRN
  Administered 2024-01-19 (×2): 1 mg via INTRAVENOUS

## 2024-01-19 MED ORDER — FENTANYL CITRATE (PF) 100 MCG/2ML IJ SOLN
INTRAMUSCULAR | Status: DC | PRN
  Administered 2024-01-19: 50 ug via INTRAVENOUS

## 2024-01-19 MED ORDER — SODIUM CHLORIDE 0.9 % IV SOLN: INTRAVENOUS | Status: DC

## 2024-01-19 MED ORDER — HEPARIN SODIUM (PORCINE) 10000 UNIT/ML IJ SOLN
INTRAMUSCULAR | Status: DC | PRN
  Administered 2024-01-19: 10000 [IU]

## 2024-01-19 MED ORDER — TRANEXAMIC ACID 1000 MG/10ML IV SOLN
500.0000 mg | Freq: Once | INTRAVENOUS | Status: AC
  Administered 2024-01-19: 500 mg via TOPICAL
  Filled 2024-01-19: qty 10

## 2024-01-19 MED ORDER — CEFAZOLIN SODIUM-DEXTROSE 1-4 GM/50ML-% IV SOLN
INTRAVENOUS | Status: AC
  Filled 2024-01-19: qty 50

## 2024-01-19 MED ORDER — LIDOCAINE HCL (PF) 1 % IJ SOLN
5.0000 mL | Freq: Once | INTRAMUSCULAR | Status: AC
  Administered 2024-01-19: 5 mL
  Filled 2024-01-19: qty 5

## 2024-01-19 MED ORDER — MIDAZOLAM HCL 2 MG/2ML IJ SOLN
INTRAMUSCULAR | Status: AC
  Filled 2024-01-19: qty 2

## 2024-01-19 MED ORDER — CEFAZOLIN SODIUM-DEXTROSE 2-4 GM/100ML-% IV SOLN: 2.0000 g | INTRAVENOUS | Status: DC

## 2024-01-19 MED ORDER — FENTANYL CITRATE (PF) 50 MCG/ML IJ SOSY
PREFILLED_SYRINGE | INTRAMUSCULAR | Status: AC
  Filled 2024-01-19: qty 1

## 2024-01-19 NOTE — ED Triage Notes (Addendum)
 Pt just had dialysis catheter replaced today at the hospital as an out patient. Pt reports tonight it began bleedings, pt takes daily blood thinner. Denies pain. Catheter is placed in right groin. Small amount of blood noted at this time

## 2024-01-19 NOTE — ED Provider Notes (Signed)
 [facility] Provider Note    Event Date/Time   First MD Initiated Contact with Patient 01/19/24 2055     (approximate)   History   Vascular Access Problem   HPI  Cliffie Gingras is a 69 y.o. female with history of ESRD on dialysis, anemia, anxiety, CHF, DVT, on Eliquis  presenting with bleeding from his dialysis catheter site that was placed today.  States that she was fine after placement, did take her evening Eliquis .  States that when she went home she noticed some warmth on her thigh and noticed that it was oozing from her catheter site.  States that the catheter site is not tender, note some swelling to her right thigh that is new since the catheter placement.  Did not get dialysis today, last dialysis was on Friday.  Per independent history from daughter, she was fine when she got out of her procedure today.  Has been holding pressure to the area since the bleeding started.  On independent chart review, she had a tunneled HD catheter placed in his right femoral vein by Dr. Marea today.     Physical Exam   Triage Vital Signs: ED Triage Vitals  Encounter Vitals Group     BP 01/19/24 2017 (!) 147/107     Girls Systolic BP Percentile --      Girls Diastolic BP Percentile --      Boys Systolic BP Percentile --      Boys Diastolic BP Percentile --      Pulse Rate 01/19/24 2017 88     Resp 01/19/24 2017 17     Temp 01/19/24 2017 98.2 F (36.8 C)     Temp src --      SpO2 01/19/24 2017 99 %     Weight 01/19/24 2015 120 lb (54.4 kg)     Height 01/19/24 2015 5' 5 (1.651 m)     Head Circumference --      Peak Flow --      Pain Score 01/19/24 2015 0     Pain Loc --      Pain Education --      Exclude from Growth Chart --     Most recent vital signs: Vitals:   01/19/24 2017  BP: (!) 147/107  Pulse: 88  Resp: 17  Temp: 98.2 F (36.8 C)  SpO2: 99%     General: Awake, no distress.  CV:  Good peripheral perfusion.  Resp:  Normal effort.   Abd:  No distention.  Other:  Tunnel catheter to her right groin, palpable femoral pulse without palpable thrill.  There is some oozing from the catheter site.  Some mild tenderness to her medial thigh without erythema, there is no swelling to her thigh but not to the rest of her right extremity.  Palpable DP pulses bilaterally   ED Results / Procedures / Treatments   Labs (all labs ordered are listed, but only abnormal results are displayed) Labs Reviewed  BASIC METABOLIC PANEL WITH GFR - Abnormal; Notable for the following components:      Result Value   CO2 21 (*)    Glucose, Bld 108 (*)    BUN 72 (*)    Creatinine, Ser 11.10 (*)    GFR, Estimated 3 (*)    Anion gap 18 (*)    All other components within normal limits  CBC WITH DIFFERENTIAL/PLATELET - Abnormal; Notable for the following components:   Hemoglobin 11.2 (*)    HCT  34.2 (*)    RDW 16.3 (*)    All other components within normal limits      PROCEDURES:  Critical Care performed: No  .Laceration Repair  Date/Time: 01/19/2024 11:21 PM  Performed by: Waymond Lorelle Cummins, MD Authorized by: Waymond Lorelle Cummins, MD   Consent:    Consent obtained:  Verbal Laceration details:    Length (cm):  0.5 Skin repair:    Repair method:  Sutures   Suture size:  3-0   Suture material:  Nylon   Suture technique:  Simple interrupted   Number of sutures:  1 Approximation:    Approximation:  Close Comments:     Please a single simple interrupted suture around catheter site for bleed from catheter site.    MEDICATIONS ORDERED IN ED: Medications  tranexamic acid  (CYKLOKAPRON ) injection 500 mg (500 mg Topical Given by Other 01/19/24 2127)  lidocaine  (PF) (XYLOCAINE ) 1 % injection 5 mL (5 mLs Other Given by Other 01/19/24 2245)     IMPRESSION / MDM / ASSESSMENT AND PLAN / ED COURSE  I reviewed the triage vital signs and the nursing notes.                              Differential diagnosis includes, but is not limited to, bleeding  from catheter site, suspect secondary to anticoagulation use.  Possible underlying hematoma.  Has been compliant with her anticoagulation, considered but doubt DVT.  Did consider anemia, electrolyte derangements.  Will get labs.  Placed on pressure on the site but with still persistent oozing, will place some TXA soaked gauze and place a pressure dressing and reassess.  Patient's presentation is most consistent with acute presentation with potential threat to life or bodily function.  Independent interpretation of labs below.  Electrolytes are reassuring.  She is not acutely anemic.  On reassessment she was bleeding through the pressure dressing.  Remove pressure dressing, place a single stitch around the catheter site.  Bleeding has stopped, placed gauze and Tegaderm.  Will reassess in 30 minutes.  On reassessment patient is no longer bleeding from her catheter site.  Considered but no indication for inpatient admission at this time, she is safe for outpatient management.  Will discharge with strict return precautions.  Instructed her to follow-up with Dr. Marea for further management of catheter site.  Instructed her to go to dialysis tomorrow as scheduled.n Instructed patient to hold her Eliquis  tomorrow morning.  Shared decision making done with patient and daughter and they are agreeable with this plan.  Discharge.    Clinical Course as of 01/19/24 2333  Mon Jan 19, 2024  2212 Independent review of labs, no leukocytosis, she is not acutely anemic, electrolytes severely deranged, creatinine is elevated but she has history of ESRD on dialysis. [TT]    Clinical Course User Index [TT] Waymond, Lorelle Cummins, MD     FINAL CLINICAL IMPRESSION(S) / ED DIAGNOSES   Final diagnoses:  Bleeding  Bleeding due to dialysis catheter placement, initial encounter     Rx / DC Orders   ED Discharge Orders     None        Note:  This document was prepared using Dragon voice recognition software and may  include unintentional dictation errors.    Waymond Lorelle Cummins, MD 01/19/24 [phone]

## 2024-01-19 NOTE — Telephone Encounter (Signed)
 A fax was received for the patient to have a permcath exchange. Patient was called and asked if she had eaten, patient ate at 5:30 am this morning. Patient was scheduled for today at the [facility] with a 1:30 pm arrival time. Patient stated she understood pre-procedure instructions.

## 2024-01-19 NOTE — Discharge Instructions (Addendum)
 Please make sure to follow-up with Dr. Marea for further management of your catheter site.  Please go to dialysis tomorrow.  Please make sure to hold your Eliquis  tomorrow morning.

## 2024-01-19 NOTE — H&P (Signed)
 [facility] Admission History & Physical  MRN : [id number]  Teresa Cook is a 69 y.o. (02/23/1955) female who presents with chief complaint of No chief complaint on file. SABRA  History of Present Illness:  I am asked to evaluate the patient by the dialysis center. The patient was sent here because they were unable to achieve adequate dialysis yesterday. Furthermore the Center states they were unable to aspirate either lumen of the catheter yesterday.  This problem has been getting worse for about 1 week. The patient is unaware of any other change.   Patient denies pain or tenderness overlying the access.  There is no pain with dialysis.  Patient denies fevers or shaking chills while on dialysis.    There have multiple any past interventions and declots of his multiple different access.  The patient is not chronically hypotensive on dialysis.   No current facility-administered medications for this encounter.     Past Surgical History:  Procedure Laterality Date   ABDOMINAL HYSTERECTOMY  1980   APPENDECTOMY     BREAST BIOPSY Left 10/28/2013   benign   BREAST EXCISIONAL BIOPSY Left 2002   benign   CORONARY ANGIOGRAPHY N/A 04/04/2020   Procedure: CORONARY ANGIOGRAPHY;  Surgeon: Darron Deatrice LABOR, MD;  Location: [facility];  Service: Cardiovascular;  Laterality: N/A;   CYSTOSCOPY N/A 12/06/2021   Procedure: CYSTOSCOPY;  Surgeon: Twylla Glendia BROCKS, MD;  Location: [facility];  Service: Urology;  Laterality: N/A;   DIALYSIS/PERMA CATHETER INSERTION N/A 02/27/2022   Procedure: DIALYSIS/PERMA CATHETER INSERTION;  Surgeon: Marea Selinda RAMAN, MD;  Location: [facility];  Service: Cardiovascular;  Laterality: N/A;   INSERTION OF DIALYSIS CATHETER  2014   LIPOMA EXCISION N/A 01/23/2016   Procedure: EXCISION LIPOMA;  Surgeon: Dorothyann LITTIE Husk, MD;  Location: [facility];  Service: General;  Laterality: N/A;   MASTECTOMY W/ SENTINEL NODE BIOPSY Bilateral 01/23/2016    Procedure: bilateral MASTECTOMY WITH  bilateral SENTINEL LYMPH NODE BIOPSY possible left axillary node dissection forehead lipoma removal;  Surgeon: Dorothyann LITTIE Husk, MD;  Location: [facility];  Service: General;  Laterality: Bilateral;   PARTIAL HYSTERECTOMY     PERIPHERAL VASCULAR CATHETERIZATION N/A 12/25/2015   Procedure: Dialysis/Perma Catheter Insertion;  Surgeon: Selinda RAMAN Marea, MD;  Location: [facility];  Service: Cardiovascular;  Laterality: N/A;   PERIPHERAL VASCULAR CATHETERIZATION Left 01/22/2016   Procedure: Dialysis/Perma Catheter Insertion;  Surgeon: Selinda RAMAN Marea, MD;  Location: [facility];  Service: Cardiovascular;  Laterality: Left;   PERIPHERAL VASCULAR CATHETERIZATION N/A 01/26/2016   Procedure: Dialysis/Perma Catheter Insertion;  Surgeon: Cordella KANDICE Shawl, MD;  Location: [facility];  Service: Cardiovascular;  Laterality: N/A;   PORT-A-CATH REMOVAL N/A 12/20/2015   Procedure: REMOVAL PORT-A-CATH;  Surgeon: Dorothyann LITTIE Husk, MD;  Location: [facility];  Service: General;  Laterality: N/A;  left     PORTACATH PLACEMENT Left 08/21/2015   Procedure: INSERTION PORT-A-CATH;  Surgeon: Dorothyann LITTIE Husk, MD;  Location: [facility];  Service: General;  Laterality: Left;   REMOVAL OF A DIALYSIS CATHETER  2017   RENAL ANGIOGRAPHY Right 11/05/2021   Procedure: RENAL ANGIOGRAPHY;  Surgeon: Marea Selinda RAMAN, MD;  Location: [facility];  Service: Cardiovascular;  Laterality: Right;  with embolization   RIGHT HEART CATH N/A 04/04/2020   Procedure: RIGHT HEART CATH;  Surgeon: Darron Deatrice LABOR, MD;  Location: [facility];  Service: Cardiovascular;  Laterality: N/A;   URETEROSCOPY Bilateral  12/06/2021   Procedure: URETEROSCOPY;  Surgeon: Twylla Glendia BROCKS, MD;  Location: [facility];  Service: Urology;  Laterality: Bilateral;    Social History[1]   Family History  Problem Relation Age of Onset   Stroke Mother    CVA Mother    Hypertension Mother     Hypertension Father    Hypertension Sister    Diabetes Sister    Cancer Sister 63       Breast   Stroke Brother    Hypertension Brother    Breast cancer Maternal Aunt 69    No family history of bleeding or clotting disorders, autoimmune disease or porphyria  Allergies[2]   REVIEW OF SYSTEMS (Negative unless checked)  Constitutional: [] Weight loss  [] Fever  [] Chills Cardiac: [] Chest pain   [] Chest pressure   [] Palpitations   [] Shortness of breath when laying flat   [] Shortness of breath at rest   [x] Shortness of breath with exertion. Vascular:  [] Pain in legs with walking   [] Pain in legs at rest   [] Pain in legs when laying flat   [] Claudication   [] Pain in feet when walking  [] Pain in feet at rest  [] Pain in feet when laying flat   [] History of DVT   [] Phlebitis   [] Swelling in legs   [] Varicose veins   [] Non-healing ulcers Pulmonary:   [] Uses home oxygen   [] Productive cough   [] Hemoptysis   [] Wheeze  [] COPD   [] Asthma Neurologic:  [] Dizziness  [] Blackouts   [] Seizures   [] History of stroke   [] History of TIA  [] Aphasia   [] Temporary blindness   [] Dysphagia   [] Weakness or numbness in arms   [] Weakness or numbness in legs Musculoskeletal:  [x] Arthritis   [] Joint swelling   [x] Joint pain   [] Low back pain Hematologic:  [] Easy bruising  [] Easy bleeding   [] Hypercoagulable state   [x] Anemic  [] Hepatitis Gastrointestinal:  [] Blood in stool   [] Vomiting blood  [] Gastroesophageal reflux/heartburn   [] Difficulty swallowing. Genitourinary:  [x] Chronic kidney disease   [] Difficult urination  [] Frequent urination  [] Burning with urination   [] Blood in urine Skin:  [] Rashes   [] Ulcers   [] Wounds Psychological:  [] History of anxiety   []  History of major depression.  Physical Examination  There were no vitals filed for this visit. There is no height or weight on file to calculate BMI. Gen: WD/WN, NAD Head: [place]/AT, No temporalis wasting.  Ear/Nose/Throat: Hearing grossly intact, nares w/o  erythema or drainage, oropharynx w/o Erythema/Exudate,  Eyes: Conjunctiva clear, sclera non-icteric Neck: Trachea midline.  No JVD.  Pulmonary:  Good air movement, respirations not labored, no use of accessory muscles.  Cardiac: RRR, normal S1, S2. Vascular:  tunneled catheter without tenderness or drainage Vessel Right Left  Radial Palpable Palpable  Gastrointestinal: soft, non-tender/non-distended. No guarding/reflex.  Musculoskeletal: M/S 5/5 throughout.  Extremities without ischemic changes.  No deformity or atrophy.  Neurologic: Sensation grossly intact in extremities.  Symmetrical.  Speech is fluent. Motor exam as listed above. Psychiatric: Judgment intact, Mood & affect appropriate for pt's clinical situation. Dermatologic: No rashes or ulcers noted.  No cellulitis or open wounds.    CBC Lab Results  Component Value Date   WBC 5.7 07/19/2023   HGB 8.7 (L) 07/19/2023   HCT 25.6 (L) 07/19/2023   MCV 86.5 07/19/2023   PLT 173 07/19/2023    BMET    Component Value Date/Time   NA 135 07/18/2023 1115   NA 140 02/29/2020 1040   NA 138 02/03/2014 0135  K 3.8 07/18/2023 1115   K 4.0 02/03/2014 0135   CL 96 (L) 07/18/2023 1115   CL 102 02/03/2014 0135   CO2 26 07/18/2023 1115   CO2 23 02/03/2014 0135   GLUCOSE 121 (H) 07/18/2023 1115   GLUCOSE 101 (H) 02/03/2014 0135   BUN 28 (H) 07/18/2023 1115   BUN 40 (H) 02/29/2020 1040   BUN 21 (H) 02/03/2014 0135   CREATININE 6.50 (H) 07/18/2023 1115   CREATININE 5.11 (H) 02/03/2014 0135   CALCIUM  10.2 07/18/2023 1115   CALCIUM  8.6 02/03/2014 0135   GFRNONAA 6 (L) 07/18/2023 1115   GFRNONAA 9 (L) 02/03/2014 0135   GFRNONAA 6 (L) 09/08/2013 0133   GFRAA 5 (L) 02/29/2020 1040   GFRAA 11 (L) 02/03/2014 0135   GFRAA 7 (L) 09/08/2013 0133   CrCl cannot be calculated (Patient's most recent lab result is older than the maximum 21 days allowed.).  COAG Lab Results  Component Value Date   INR 1.3 (H) 03/25/2023   INR 1.3 (H)  11/30/2021   INR 1.4 (H) 11/13/2021    Radiology No results found.  Assessment/Plan 1.  Complication dialysis device with thrombosis AV access:  Patient's tunneled catheter is thrombosed. The patient will undergo exchange of the catheter same venous access using interventional techniques.  The risks and benefits were described to the patient.  All questions were answered.  The patient agrees to proceed with intervention.  2.  End-stage renal disease requiring hemodialysis:  Patient will continue dialysis therapy without further interruption if a successful exchange is not achieved then new site will be found for tunneled catheter placement. Dialysis has already been arranged since the patient missed their previous session 3.  Hypertension:  Patient will continue medical management; nephrology is following no changes in oral medications. 4. Diabetes mellitus:  Glucose will be monitored and oral medications been held this morning once the patient has undergone the patient's procedure po intake will be reinitiated and again Accu-Cheks will be used to assess the blood glucose level and treat as needed. The patient will be restarted on the patient's usual hypoglycemic regime     Selinda Gu, MD  01/19/2024 1:41 PM         [1]  Social History Tobacco Use   Smoking status: Never   Smokeless tobacco: Never  Vaping Use   Vaping status: Never Used  Substance Use Topics   Alcohol use: No    Alcohol/week: 0.0 standard drinks of alcohol   Drug use: No  [2]  Allergies Allergen Reactions   Amlodipine  Palpitations   Gabapentin  Other (See Comments)    Seizure   Hydrochlorothiazide     Other Reaction(s): gout   Atorvastatin      Other Reaction(s): myalgias   Lisinopril Cough   Adhesive [Tape] Itching    Silk tape is ok to use.

## 2024-01-19 NOTE — Op Note (Signed)
 OPERATIVE NOTE    PRE-OPERATIVE DIAGNOSIS: 1. ESRD 2. Non-functional permcath  POST-OPERATIVE DIAGNOSIS: same as above  PROCEDURE: Fluoroscopic guidance for placement of catheter Placement of a 44 cm tip to cuff tunneled hemodialysis catheter via the right femoral vein and removal of previous catheter  SURGEON: Selinda Gu, MD  ANESTHESIA:  Local with moderate conscious sedation for 12 minutes using 2 mg of Versed  and 50 mcg of Fentanyl   ESTIMATED BLOOD LOSS: 3 cc  FINDING(S): none  SPECIMEN(S):  None  INDICATIONS:   Patient is a 69 y.o.female who presents with non-functional dialysis catheter and ESRD.  The patient needs long term dialysis access for their ESRD, and a Permcath is necessary.  Risks and benefits are discussed and informed consent is obtained.    DESCRIPTION: After obtaining full informed written consent, the patient was brought back to the vascular suite. The patient received moderate conscious sedation during a face-to-face encounter with me present throughout the entire procedure and supervising the RN monitoring the vital signs, pulse oximetry, telemetry, and mental status throughout the entire procedure. The patient's existing catheter, right thigh and groin were sterilely prepped and draped in a sterile surgical field was created.  The existing catheter was dissected free from the fibrous sheath securing the cuff with hemostats and blunt dissection.  A wire was placed. The existing catheter was then removed and the wire used to keep venous access. I selected a 44 cm tip to cuff tunneled dialysis catheter.  Using fluoroscopic guidance the catheter tips were parked in the right atrium. The appropriate distal connectors were placed. It withdrew blood well and flushed easily with heparinized saline and a concentrated heparin  solution was then placed. It was secured to the thigh with 2 Prolene sutures. A 4-0 Monocryl pursestring suture was placed around the exit site.  Sterile dressings were placed. The patient tolerated the procedure well and was taken to the recovery room in stable condition.  COMPLICATIONS: None  CONDITION: Stable  Selinda Gu 01/19/2024 5:33 PM   This note was created with Dragon Medical transcription system. Any errors in dictation are purely unintentional.

## 2024-01-20 ENCOUNTER — Encounter: Payer: Self-pay | Admitting: Vascular Surgery
